# Patient Record
Sex: Male | Born: 1950
Health system: Southern US, Community
[De-identification: ages and names within clinical notes are randomized; demographics above are authoritative.]

## PROBLEM LIST (undated history)

## (undated) DIAGNOSIS — M109 Gout, unspecified: Secondary | ICD-10-CM

## (undated) DIAGNOSIS — G20A1 Parkinson's disease without dyskinesia, without mention of fluctuations: Secondary | ICD-10-CM

## (undated) DIAGNOSIS — K219 Gastro-esophageal reflux disease without esophagitis: Secondary | ICD-10-CM

## (undated) DIAGNOSIS — IMO0002 Reserved for concepts with insufficient information to code with codable children: Secondary | ICD-10-CM

## (undated) DIAGNOSIS — M1990 Unspecified osteoarthritis, unspecified site: Secondary | ICD-10-CM

## (undated) DIAGNOSIS — G709 Myoneural disorder, unspecified: Secondary | ICD-10-CM

## (undated) DIAGNOSIS — T8859XA Other complications of anesthesia, initial encounter: Secondary | ICD-10-CM

## (undated) DIAGNOSIS — M722 Plantar fascial fibromatosis: Secondary | ICD-10-CM

## (undated) DIAGNOSIS — G4730 Sleep apnea, unspecified: Secondary | ICD-10-CM

## (undated) DIAGNOSIS — I7121 Aneurysm of the ascending aorta, without rupture: Secondary | ICD-10-CM

## (undated) DIAGNOSIS — S060X9A Concussion with loss of consciousness of unspecified duration, initial encounter: Secondary | ICD-10-CM

## (undated) DIAGNOSIS — N400 Enlarged prostate without lower urinary tract symptoms: Secondary | ICD-10-CM

## (undated) DIAGNOSIS — M48061 Spinal stenosis, lumbar region without neurogenic claudication: Secondary | ICD-10-CM

## (undated) DIAGNOSIS — S060XAA Concussion with loss of consciousness status unknown, initial encounter: Secondary | ICD-10-CM

## (undated) DIAGNOSIS — H409 Unspecified glaucoma: Secondary | ICD-10-CM

## (undated) DIAGNOSIS — K5790 Diverticulosis of intestine, part unspecified, without perforation or abscess without bleeding: Secondary | ICD-10-CM

## (undated) DIAGNOSIS — T4145XA Adverse effect of unspecified anesthetic, initial encounter: Secondary | ICD-10-CM

## (undated) DIAGNOSIS — I739 Peripheral vascular disease, unspecified: Secondary | ICD-10-CM

## (undated) DIAGNOSIS — E785 Hyperlipidemia, unspecified: Secondary | ICD-10-CM

## (undated) DIAGNOSIS — F32A Depression, unspecified: Secondary | ICD-10-CM

## (undated) DIAGNOSIS — G20 Parkinson's disease: Secondary | ICD-10-CM

## (undated) DIAGNOSIS — T7840XA Allergy, unspecified, initial encounter: Secondary | ICD-10-CM

## (undated) DIAGNOSIS — F419 Anxiety disorder, unspecified: Secondary | ICD-10-CM

## (undated) DIAGNOSIS — B1920 Unspecified viral hepatitis C without hepatic coma: Secondary | ICD-10-CM

## (undated) DIAGNOSIS — I712 Thoracic aortic aneurysm, without rupture: Secondary | ICD-10-CM

## (undated) DIAGNOSIS — I10 Essential (primary) hypertension: Secondary | ICD-10-CM

## (undated) HISTORY — PX: UPPER GASTROINTESTINAL ENDOSCOPY: SHX188

## (undated) HISTORY — DX: Spinal stenosis, lumbar region without neurogenic claudication: M48.061

## (undated) HISTORY — PX: TONSILLECTOMY: SUR1361

## (undated) HISTORY — DX: Essential (primary) hypertension: I10

## (undated) HISTORY — DX: Hyperlipidemia, unspecified: E78.5

## (undated) HISTORY — PX: COLONOSCOPY: SHX174

## (undated) HISTORY — DX: Diverticulosis of intestine, part unspecified, without perforation or abscess without bleeding: K57.90

## (undated) HISTORY — PX: EYE SURGERY: SHX253

## (undated) HISTORY — DX: Unspecified viral hepatitis C without hepatic coma: B19.20

## (undated) HISTORY — DX: Unspecified glaucoma: H40.9

## (undated) HISTORY — DX: Peripheral vascular disease, unspecified: I73.9

## (undated) HISTORY — DX: Gout, unspecified: M10.9

## (undated) HISTORY — DX: Allergy, unspecified, initial encounter: T78.40XA

---

## 1999-09-03 ENCOUNTER — Inpatient Hospital Stay (HOSPITAL_COMMUNITY): Admission: EM | Admit: 1999-09-03 | Discharge: 1999-09-05 | Payer: Self-pay | Admitting: Emergency Medicine

## 2003-07-11 ENCOUNTER — Ambulatory Visit (HOSPITAL_COMMUNITY): Admission: RE | Admit: 2003-07-11 | Discharge: 2003-07-11 | Payer: Self-pay | Admitting: Gastroenterology

## 2003-07-11 ENCOUNTER — Encounter (INDEPENDENT_AMBULATORY_CARE_PROVIDER_SITE_OTHER): Payer: Self-pay | Admitting: Specialist

## 2003-07-28 HISTORY — PX: CARDIAC CATHETERIZATION: SHX172

## 2003-12-24 ENCOUNTER — Inpatient Hospital Stay (HOSPITAL_COMMUNITY): Admission: EM | Admit: 2003-12-24 | Discharge: 2003-12-27 | Payer: Self-pay | Admitting: Gastroenterology

## 2003-12-25 ENCOUNTER — Encounter (INDEPENDENT_AMBULATORY_CARE_PROVIDER_SITE_OTHER): Payer: Self-pay | Admitting: *Deleted

## 2004-03-26 ENCOUNTER — Ambulatory Visit (HOSPITAL_COMMUNITY): Admission: RE | Admit: 2004-03-26 | Discharge: 2004-03-26 | Payer: Self-pay | Admitting: Gastroenterology

## 2004-03-26 ENCOUNTER — Encounter (INDEPENDENT_AMBULATORY_CARE_PROVIDER_SITE_OTHER): Payer: Self-pay | Admitting: Specialist

## 2005-10-08 ENCOUNTER — Encounter: Admission: RE | Admit: 2005-10-08 | Discharge: 2005-10-08 | Payer: Self-pay | Admitting: Gastroenterology

## 2006-05-10 ENCOUNTER — Ambulatory Visit: Payer: Self-pay | Admitting: Family Medicine

## 2006-05-25 ENCOUNTER — Ambulatory Visit: Payer: Self-pay | Admitting: Family Medicine

## 2006-09-15 ENCOUNTER — Ambulatory Visit: Payer: Self-pay | Admitting: Family Medicine

## 2007-08-25 ENCOUNTER — Ambulatory Visit: Payer: Self-pay | Admitting: Family Medicine

## 2007-10-27 ENCOUNTER — Ambulatory Visit: Payer: Self-pay | Admitting: Family Medicine

## 2008-05-21 ENCOUNTER — Ambulatory Visit (HOSPITAL_COMMUNITY): Admission: RE | Admit: 2008-05-21 | Discharge: 2008-05-21 | Payer: Self-pay | Admitting: Ophthalmology

## 2008-09-17 ENCOUNTER — Encounter (INDEPENDENT_AMBULATORY_CARE_PROVIDER_SITE_OTHER): Payer: Self-pay | Admitting: Ophthalmology

## 2008-09-17 ENCOUNTER — Ambulatory Visit (HOSPITAL_COMMUNITY): Admission: RE | Admit: 2008-09-17 | Discharge: 2008-09-17 | Payer: Self-pay | Admitting: Ophthalmology

## 2008-10-29 ENCOUNTER — Ambulatory Visit: Payer: Self-pay | Admitting: Family Medicine

## 2008-12-13 ENCOUNTER — Ambulatory Visit: Payer: Self-pay | Admitting: Family Medicine

## 2009-02-08 ENCOUNTER — Ambulatory Visit: Payer: Self-pay | Admitting: Family Medicine

## 2009-03-19 ENCOUNTER — Ambulatory Visit: Payer: Self-pay | Admitting: Family Medicine

## 2009-05-08 ENCOUNTER — Ambulatory Visit: Payer: Self-pay | Admitting: Family Medicine

## 2009-05-16 ENCOUNTER — Ambulatory Visit: Payer: Self-pay | Admitting: Family Medicine

## 2009-07-10 ENCOUNTER — Ambulatory Visit: Payer: Self-pay | Admitting: Family Medicine

## 2009-07-27 HISTORY — PX: NM MYOVIEW LTD: HXRAD82

## 2009-08-09 ENCOUNTER — Ambulatory Visit: Payer: Self-pay | Admitting: Family Medicine

## 2009-09-20 ENCOUNTER — Ambulatory Visit: Payer: Self-pay | Admitting: Family Medicine

## 2010-08-17 ENCOUNTER — Encounter: Payer: Self-pay | Admitting: Gastroenterology

## 2010-09-17 ENCOUNTER — Encounter (INDEPENDENT_AMBULATORY_CARE_PROVIDER_SITE_OTHER): Payer: Commercial Managed Care - PPO | Admitting: Family Medicine

## 2010-09-17 DIAGNOSIS — I10 Essential (primary) hypertension: Secondary | ICD-10-CM

## 2010-09-17 DIAGNOSIS — E785 Hyperlipidemia, unspecified: Secondary | ICD-10-CM

## 2010-09-17 DIAGNOSIS — Z0000 Encounter for general adult medical examination without abnormal findings: Secondary | ICD-10-CM

## 2010-09-17 DIAGNOSIS — M461 Sacroiliitis, not elsewhere classified: Secondary | ICD-10-CM

## 2010-11-11 LAB — CBC
HCT: 45.2 % (ref 39.0–52.0)
Platelets: 316 10*3/uL (ref 150–400)
RDW: 12.7 % (ref 11.5–15.5)

## 2010-11-11 LAB — BASIC METABOLIC PANEL
BUN: 16 mg/dL (ref 6–23)
Calcium: 9.5 mg/dL (ref 8.4–10.5)
Creatinine, Ser: 1.15 mg/dL (ref 0.4–1.5)
GFR calc non Af Amer: >60 mL/min (ref >60)
Glucose, Bld: 89 mg/dL (ref 70–99)
Sodium: 140 mEq/L (ref 135–145)

## 2010-12-09 NOTE — Op Note (Signed)
[name], [name]               ACCOUNT NO.:  [id number]   MEDICAL RECORD NO.:  [id number]          PATIENT TYPE:  AMB   LOCATION:  SDS                          FACILITY:  [facility]   PHYSICIAN:  Alford Highland. Rankin, M.D.   DATE OF BIRTH:  [birth year]   DATE OF PROCEDURE:  05/21/2008  DATE OF DISCHARGE:                               OPERATIVE REPORT   PREOPERATIVE DIAGNOSES:  1. Epiretinal membrane with internal limiting membrane topographic      distortion.  2. Proliferative vitreal retinopathy, stage B.  3. History of retinal tear, retinal detachment, giant retinal tear,      right eye.   POSTOPERATIVE DIAGNOSES:  1. Epiretinal membrane with internal limiting membrane topographic      distortion.  2. Proliferative vitreal retinopathy, stage B.  3. History of retinal tear, retinal detachment, giant retinal tear,      right eye.  4. Vitreous herniation into the anterior chamber.   PROCEDURE:  1. Posterior vitrectomy with internal limiting membrane peel - 25      gauge, right eye with additional laser retinal pack __________      temporally  to maintain retinal reattachment for giant retinal      tear.  2. Anterior vitrectomy performed with posterior segment instruments      via small paracentesis incisions anteriorly.  3. Posterior capsulotomy, surgical, to clear the visual access.   SURGEON:  Alford Highland. Rankin, MD.   ANESTHESIA:  Local as per my anesthesia control.   INDICATIONS FOR PROCEDURE:  The patient is a 60 year old man who has a  history of giant retinal tear detachment, right eye with significant  vitreous dispersion in the right eye leading to cloudy vision with  additional epiretinal membrane disclosed posteriorly.  The patient  __________ to remove the epiretinal membrane, to remove the vitreous  opacification in order to maximize visual performance.  The patient  understands the risk of anesthesia, including occurrence of death, loss  of the eye including but not  limited to hemorrhage, infection, scarring,  need for another surgery, no change in vision, loss of vision,  progressive disease despite intervention.   PROCEDURE IN DETAIL:  Appropriate signed consent was obtained.  The  patient was taken to the operating room.  In the operating room,  appropriate monitors followed by mild sedation.  Xylocaine 2% and 5 mL  injected retrobulbar with additional 5 mL laterally in fashion of  modified Darel Hong.  The left periocular region was then sterilely  prepped and draped in the usual sterile fashion.  Lid speculum was  applied.  A 25-gauge trocar was placed in the inferotemporal quadrant.  Superior trocar was applied.  Core vitrectomy was then begun.  The  notable findings were significant, large clumps of pigment debris in the  vitreous, mid vitreous, as well as around the vitreous base.  Sclerae  pressure was then used to clear vitreous base, vitreous circumcised 360  degrees.  Visual access was clear.  It is notable at this time that  there is mild posterior capsule opacification which was going to limit  macular  surgery and for this reason surgical posterior capsulotomy was  fashioned.  At this time, a 25-gauge forceps was then used to find an  edge temporally over the epiretinal membrane.  The epiretinal membrane  was removed followed thereafter internal limiting membrane peel with the  typical change of the retina found after removal of the internal  limiting membrane.  No complications occurred.  At this time, the  instruments were removed from the eye, and laser photocoagulation was  placed on the posterior edge of the giant retinal tear previously noted  in temporally in this right eye.  No complication occurred.  The retina  remained nicely attached.  At this time, the instruments were removed  from the eye, and the __________  trocar was removed.  The infusion was  removed.  Subconjunctival Decadron applied.  Sterile patch and Fox  shield  were applied.  The patient tolerated the procedure well without  complications.      Alford Highland Rankin, M.D.  Electronically Signed     GAR/MEDQ  D:  05/21/2008  T:  05/22/2008  Job:  [id number]

## 2010-12-09 NOTE — Op Note (Signed)
NAME:  [name], [name]               ACCOUNT NO.:  [id number]   MEDICAL RECORD NO.:  [id number]          PATIENT TYPE:  AMB   LOCATION:  SDS                          FACILITY:  [facility]   PHYSICIAN:  Alford Highland. Rankin, M.D.   DATE OF BIRTH:  08-Oct-1950   DATE OF PROCEDURE:  09/17/2008  DATE OF DISCHARGE:                               OPERATIVE REPORT   PREOPERATIVE DIAGNOSES:  1. Dislocated posterior chamber intraocular lens, right eye.  2. Aphakia, right eye.  3. Secondary open-angle glaucoma - pigmentary glaucoma, out of control      despite maximally tolerated medical therapy, right eye.   PROCEDURES:  1. Posterior vitrectomy, right eye - 25 gauge.  2. Removal of dislocated intraocular lens - foreign body in the      vitreous cavity of the right eye.  3. Insertion secondarily of anterior chamber intraocular lens of the      right eye.  4. Insertion of glaucoma Seton aqueous shunt, Baerveldt via the      anterior chamber, model BG101-350.  The lens is an anterior chamber      lens which is an MTA4UO, power of +11.5.  5. Surgical capsulotomy.   SURGEON:  Alford Highland. Rankin, MD   ANESTHESIA:  General endotracheal anesthesia.   INDICATIONS FOR PROCEDURE:  The patient is a 60 year old man who has a  complicated history of ocular problems which began with retinal tear  detachments which after treatment extended to a giant retinal tear  detachment superotemporally in the right eye.  This has been repaired in  the past with vitrectomy, laser photocoagulation, and injection of  intravitreal gas.  Such mechanical cataract development resulted in  cataract extraction with intraocular lens placement in [facility], which developed intraocular lens malposition resulting in  malposition of the lens and subsequent dislocation of lens in the right  eye.  The patient was said this was an attempt to have lens following  the visual axis and he has poor vision.  The patient also has  glaucoma  of intraocular pressures of 40 mm out of control, despite maximally  tolerated medical therapy.  The patient was said this is an attempt to  remove the dislocated intraocular lens, correct the underlying aphakia  problem, and also control the intraocular pressure.  He understands the  risk of anesthesia including rare occurrence of death, loss of the eye  including but not limited to hemorrhage, infection, scarring, need for  another surgery, no change in vision, loss of vision, and progressive  disease despite intervention.   PROCEDURE IN DETAIL:  Appropriate signed consent was obtained.  The  patient was taken to the operating room.  In the operating room  appropriate monitors followed by mild sedation.  General endotracheal  anesthesia was instituted.  This had been this patient's request.  The  right periocular region was sterilely prepped and draped in the usual  ophthalmic fashion.  The lid speculum was applied.  The conjunctival  peritomy was then fashioned superiorly around the 8:30 position in this  right eye and to the 1:30 position.  The superior rectus and the lateral  rectus muscles were isolated on 2-0 silk ties.  Quadrants were entered  superonasally.  This allowed for 2-0 silk ties to be placed on the  rectus muscles, two rectus muscles as mentioned before.  At this time, a  25-gauge infusion cannula was placed in the inferotemporal quadrant  transconjunctivally.  Infusion was then turned on.  A superior trocar  was then applied.  Vitrectomy had been previously done.  Vitrectomy  instrumentations were placed in the eye and the bilateral attachment  placed on the [employer], the new [employer].   It was clear that there was no residual vitreous peripherally,  centrally, or elsewhere.  At this time, a groove limbal incision was  then fashioned superiorly in anticipation of removal of the lens as well  as placement of secondary intraocular lens.  At  this time, the  intraocular lens was grasped posteriorly with the 25-gauge forceps under  direct microscopic visualization.  This was brought forward with the  left hand to the iris plane.  The limbal wound superiorly was opened  with an MVR blade.  The Viscoat had been used previously to keep the  cornea coated and the anterior chamber formed.  This smaller wound was  then enlarged with a 20-gauge MVR blade and this allowed for passage of  a 20-gauge DORC forceps to extract the intraocular lens and externalize  this.  This was sent for gross only evaluation.   At this time, the intraocular lens for the anterior chamber was  selected.  The MTA4UO was selected.  The white to white corneal  measurements was 12.75.  The intraocular lens was then placed into the  anterior chamber and secured in a horizontal fashion what appeared to be  a scleral spur.  No iris indentation was noted.   At this time, the limbal wound superiorly was closed with interrupted 10-  0 nylon sutures.  These sutures were then cut in interrupted fashion and  the knots were rotated away from limbus.  At this time, a surgical  peripheral iridectomy was then performed superiorly at the 12 o'clock  position with the vitrectomy instrumentation.   The remnants of the posterior capsule were removed as a capsulectomy to  prevent a nidus of proliferative tissue in the vitreous cavity.  Remnants of this capsule were then excised with vitrectomy  instrumentation.   At this time, attention was drawn to placement of the glaucoma shunt.  The Baerveldt valve was irrigated out through the tube to confirm its  patency.  The Baerveldt valve had been previously dipped in sponges  findings.  Thereafter, the Baerveldt valve wings were placed in the  lateral and superior rectus muscles.  The baseplate was then secured  with 8-0 nylon sutures to the sclera.  Approximately 15-16 mm posterior  to the limbus in a post-equatorial  fashion.   The tube was brought forward and this was gently laid flat over the  rounded contour of the sclera with 7-0 Vicryl horizontal mattress suture  lightly.  The tube was then cut in a beveled fashion and a scleral  tunnel through the limbus was fashioned initially with the beveled blade  and then subsequently the anterior chamber was entered in this area with  a 20-gauge MVR.  The beveled tube was then placed in the anterior  chamber.  The intraocular pressure of the eye was still well controlled  and this was done in low pressures.  A  ligature of 7-0 Vicryl was then  applied with the intraocular pressure at 20 mm with a very low flow  confirmed and then subsequently was able to check this level off at 20  mm and flow was resumed at pressures above 25-30 mm.  This confirmed  that the ligature had closed the lumen of the tube appropriately to help  control the early postoperative pressures.   At this time, Tutoplast was secured in a vertical mattress fashion with  7-0 Vicryl.  The conjunctiva and Tenons were irrigated with bug juice  and these were brought forward and closed at the limbus with 7-0 Vicryl  sutures.  Previously, the 2-0 silk had been removed and now at this time  the infusion cannula was removed.  Intraocular pressure before removal  had been around up to 40 and I was confirmed that the conjunctiva  superiorly did elevate confirming that there was flow at these higher  pressures through the tube.  At this time, the infusion cannula was  removed and subconjunctival Decadron applied.  Sterile patch and Fox  shield applied.  The patient tolerated the procedure well without  complication.  The patient was awakened from anesthesia and taken to  PACU in good and stable addition.      Alford Highland Rankin, M.D.  Electronically Signed     Alford Highland. Rankin, M.D.  Electronically Signed    GAR/MEDQ  D:  09/17/2008  T:  09/18/2008  Job:  [id number]   cc:   Bettye Boeck,  M.D.

## 2010-12-12 NOTE — Op Note (Signed)
NAME:  [name], [name]                         ACCOUNT NO.:  [id number]   MEDICAL RECORD NO.:  [id number]                   PATIENT TYPE:  AMB   LOCATION:  ENDO                                 FACILITY:  [facility]   PHYSICIAN:  Bernette Redbird, M.D.                DATE OF BIRTH:  1951-03-25   DATE OF PROCEDURE:  03/26/2004  DATE OF DISCHARGE:                                 OPERATIVE REPORT   PROCEDURE:  Upper endoscopy with biopsies.   INDICATION:  [name], who, 3 months ago, had an upper GI  bleed related to an antral ulcer, at which time the patient was on a daily  baby aspirin.  Since then, he has been off the aspirin and treated with  Protonix.  H. pylori was absent on biopsies at the time of his endoscopy.   FINDINGS:  Diffuse erythematous gastritis, with healing of previous gastric  ulcer.   DESCRIPTION OF PROCEDURE:  The nature, purpose, and risks of the procedure  were familiar to the patient from prior examination, and he provided written  consent.  Sedation was fentanyl 50 mcg and Versed 7 mg IV without  arrhythmias or desaturation.  The Olympus video endoscope was passed under  direct vision.  The vocal cords were not well seen but looked grossly  normal.  The esophagus was entered without difficulty and had normal mucosa  without evidence of reflux esophagitis, Barrett's esophagus, varices,  infection, and neoplasia or any ring, stricture, or hiatal hernia.  The  stomach contained a small, clear residual.   The gastric mucosa was diffusely erythematous but without any focal  erosions, ulcers, polyps, masses, or mucosal hemorrhages.  The pylorus,  duodenal bulb, and second duodenum looked normal.   In the prepyloric antral region on the anterior wall of the stomach, there  was a dimple consistent with scarring from previous ulceration but no  evidence of residual ulceration at this time.   Antral biopsies were obtained to look for evidence of H. pylori  infection as  confirmation of the previous negative biopsies.  The scope was then removed  from the patient who tolerated the procedure well and without apparent  complication.   IMPRESSION:  1.  Diffuse, erythematous gastritis (535.40).  2.  Resolution of previous antral ulceration.   PLAN:  1.  Await pathology results and treat H. pylori infection if detected.  2.  It is a judgment call, at the discretion of the patient's cardiologist      and primary physician, whether or not he has      sufficient risk factors to warrant ongoing prophylactic aspirin therapy.      If so, I would have him remain on a PPI forever for the purpose of ulcer      prophylaxis.  If he does not resume aspirin, it would be okay to go off      his PPI therapy.  Bernette Redbird, M.D.    RB/MEDQ  D:  03/26/2004  T:  03/26/2004  Job:  [id number]   cc:   Madaline Savage, M.D.  [address]  [place]  [place] [address]  Fax: [phone]   Sharlot Gowda, M.D.  [address]  [place], [place] [address]  Fax: [phone]

## 2010-12-12 NOTE — H&P (Signed)
[facility]  Patient:    [name], [name]                      MRN: [id number] Adm. Date:  [id number] Attending:  Sandi Raveling CC:         Genene Churn. Sherin Quarry, M.D.             Ronnald Nian, M.D.             Roosvelt Harps, M.D.                         History and Physical  CHIEF COMPLAINT:  Weakness.  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  He has had some job stress and has had increased alcohol consumption,  drinking four to five beers or liquor on the weekends, the last 48 hours prior o admission.  Smokes a four cigarettes a week.  On the day prior to admission he ad a dark stool.  On the day of admission, early in the morning, he had another dark stool.  He got up, went to work, had a lunch of vegetables at noon on the day of admission.  Two to three hours later he began to feel dizzy with standing and diaphoretic.  He was referred by his physician to [facility], who evaluated him and noted him to be presyncopal with a hemoglobin of 11.5 and referred him o the emergency room.  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  He has grown children.  Two to three cigarettes a week.  Four to five alcohol or beer every week.  CURRENT MEDICATIONS:  HCTZ and Cardura, unknown dosage.  PAST MEDICAL HISTORY:  In the 30s ESG by either Dr. Sherin Quarry or Thurston Hole for hematemesis.  He was treated again in 1990 with question antibiotics for PUD by  Dr. Susann Givens.  In 1999 he saw Dr. Luther Parody for PUD with again antibiotic treatment. No ESG.  Tonsillectomy as a child.  FAMILY HISTORY:  Mother deceased in her 34s of heart disease.  Father living, has hypertension.  Sister, status  post nephrectomy for renal cancer.  Denies family  history of pulmonary, GI, diabetes, or emotional problems.  REVIEW OF SYSTEMS:  HEENT:  Wears glasses.  CARDIAC:  Denies chest pain in the ast or currently.  PULMONARY:  No wheezing or shortness of breath. GASTROINTESTINAL: See above.  PHYSICAL EXAMINATION:  GENERAL:  Physical examination reveals a pleasant black male who is slightly dizzy with sitting and standing.  VITAL SIGNS:  Blood pressure 110/60, pulse 88, respiratory rate 12, temperature  97.6.  HEENT:  Fundi normal.  Pharynx found injected.  NECK:  Carotid 2+.  No bruits.  No thyromegaly.  CARDIAC:  Normal S1, S2.  LUNGS:  Clear to A&P.  ABDOMEN:  Soft.  Minimal epigastric tenderness.  RECTAL:  Examination done an hour ago by Dr. Theresia Lo was grossly positive for blood.  Hemoglobin 11.5.  IMPRESSION:  Gastrointestinal bleed, probably peptic ulcer, possibly related to  recent ethanol.  PLAN:  Admission, observation, ESG. DD:  09/03/99 TD:  09/04/99 Job: [id number]

## 2010-12-12 NOTE — Op Note (Signed)
NAME:  [name], [name]                         ACCOUNT NO.:  [id number]   MEDICAL RECORD NO.:  [id number]                   PATIENT TYPE:  AMB   LOCATION:  ENDO                                 FACILITY:  [facility]   PHYSICIAN:  Bernette Redbird, M.D.                DATE OF BIRTH:  09-15-1950   DATE OF PROCEDURE:  07/11/2003  DATE OF DISCHARGE:                                 OPERATIVE REPORT   PROCEDURE:  Colonoscopy with polypectomy.   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  No risk factors, no symptoms.   FINDINGS:  Small sessile rectal cancer polyp removed.  Mild left-sided  diverticulosis.   DESCRIPTION OF PROCEDURE:  The nature, purpose, and risks of the procedure  had been discussed with the patient who provided written consent.  Sedation  was fentanyl 100 micrograms and Versed 8 mg IV without arrhythmias or  desaturation.  On digital exam, the prostate was normal.  The Olympus adult  videocolonoscope was advanced to the terminal ileum with slight looping  overcome by external abdominal compression, and pullback was the performed.  The terminal ileum had normal appearance, and the quality of the prep in the  colon was excellent, so it was felt that all areas were well seen.   There was a 4-mm sessile slightly verrucous polyp in the mid-rectum at about  10 cm removed by snare technique with complete hemostasis and no evidence of  excessive cautery.  No other polyps were seen, and there was no evidence of  cancer, colitis, or vascular malformations.  There was some mild left-sided  diverticulosis.  Retroflexion in the rectum and reinspection of the  rectosigmoid was otherwise unremarkable.   The patient tolerated the procedure well, and there were no apparent  complications.   IMPRESSION:  1. Rectal polyp (211.4).  2. Left-sided diverticulosis.   PLAN:  Await pathology results.         Bernette Redbird, M.D.    RB/MEDQ  D:  07/11/2003  T:  07/11/2003  Job:  [id number]   cc:   Rene Kocher, M.D.

## 2010-12-12 NOTE — Op Note (Signed)
NAME:  [name], [name]                         ACCOUNT NO.:  [id number]   MEDICAL RECORD NO.:  [id number]                   PATIENT TYPE:  INP   LOCATION:  0344                                 FACILITY:  [facility]   PHYSICIAN:  Bernette Redbird, M.D.                DATE OF BIRTH:  [birth year]   DATE OF PROCEDURE:  12/25/2003  DATE OF DISCHARGE:                                 OPERATIVE REPORT   PROCEDURE:  Upper endoscopy with biopsies.   INDICATIONS:  [name] who was at his birthday party, had had  a few drinks that day and preceding days, also takes a daily 81 mg Ecotrin,  and had taken some additional aspirin for a headache.  He developed  hematemesis, melena, and syncope and was admitted to [facility] and  from there transferred to [facility] to be closer to home.  He has remained  stable in the hospital over the past 24 hours.   FINDINGS:  Antral ulcer with dirty base consistent with recent hemorrhage.   PROCEDURE:  The risks of the procedure had been reviewed with the patient,  and he provided written consent.  Sedation was fentanyl 50 mcg and Versed 4  mg IV without arrhythmias or desaturation.  The Olympus video endoscope was  passed under direct vision.  The vocal cords were not well-seen, but the  esophagus was readily entered and was normal in its entirety, without  evidence of reflux esophagitis, Barrett's esophagus, varices, infection,  neoplasia, or any Mallory-Weiss tear, ring, stricture, or hiatal hernia.   The stomach was entered.  It contained a small amount of amber-colored bile.  I did not see any blood or coffee-grounds.   In the antral region, probably about 5 cm proximal to the pylorus, along the  lesser curve, was a fairly deep, roughly 1.5 x 1 cm ulcer with a reddish  base but no discrete visible vessel and no adherent clot.  The margins of  the ulcer were smooth and not heaped-up, and the ulcer was overall benign in  appearance.  Antral  biopsies were obtained prior to removal of the scope to  look for H. pylori infection.  The pylorus, duodenal bulb, and second  duodenum were unremarkable in appearance, was a retroflexed view of the more  proximal sections of the stomach.   I elected not to inject the ulcer since no discrete visible vessel was  observed.  The scope was removed from the patient, who tolerated the  procedure well and without apparent complication.   IMPRESSION:  1. Antral gastric ulcer with dirty base, accounting for patient's recent     gastrointestinal bleeding, 531.00.  2. No bleeding at the time of this exam.   PLAN:  1. Await pathology results.  2. Continue antipeptic therapy.  3. Further management to depend on the patient's clinical evolution.  Bernette Redbird, M.D.    RB/MEDQ  D:  12/25/2003  T:  12/25/2003  Job:  [id number]   cc:   Sharlot Gowda, M.D.  [address]  [place], [place] [address]  Fax: [phone]

## 2010-12-12 NOTE — Discharge Summary (Signed)
NAME:  [name], [name]                         ACCOUNT NO.:  [id number]   MEDICAL RECORD NO.:  [id number]                   PATIENT TYPE:  INP   LOCATION:  0344                                 FACILITY:  [facility]   PHYSICIAN:  Bernette Redbird, M.D.                DATE OF BIRTH:  January 07, 1951   DATE OF ADMISSION:  12/24/2003  DATE OF DISCHARGE:  12/27/2003                                 DISCHARGE SUMMARY   FINAL DIAGNOSES:  1. Gastrointestinal bleeding secondary to gastric ulcer, probably related to     aspirin exposure.  2. Posthemorrhagic anemia secondary to #1.  3. Hypertension.   CONSULTATIONS:  None.   PROCEDURE:  Upper endoscopy with biopsy.   COMPLICATIONS:  None.   LABORATORY DATA:  Admission hemoglobin was 9.6 and discharge hemoglobin,  without need for transfusion, was 9.4.  BUN on the day after admission was  8.  Liver chemistries normal, albumin 3.0, platelets 333,000.   HOSPITAL COURSE:  [name] was at his 53th birthday party in [place],  having had a few drinks both that day and the preceding day and also taking  a daily 81 mg Ecotrin for cardiac prophylaxis plus some additional aspirin  for a headache.  He developed hematemesis, melena a couple of times and  actually became syncopal and was admitted emergently to [facility] and  then transferred here to be closer to home. By the time he arrived here, his  vital signs were stable and he really showed no evidence of active GI  bleeding so he was observed overnight with IV fluids and IV Protonix and was  endoscoped by me the next morning which showed an antral ulcer with  __________ suggestive of recent hemorrhage, but with no blood in the stomach  and no visible vessel.  Biopsies of the antrum showed no H. pylori  infection.  He was treated with intensive antipeptic therapy, his diet and  activity were advanced, and he remained stable over the 48 hours he was in  the hospital so discharge home was felt to  be clinically appropriate.   DISPOSITION:  The patient is discharged home on a regular diet with light  activity, as tolerated.  He is to call us in the event of recurrent  bleeding.  Followup with me in the office will be in about one month.   DISCHARGE MEDICATIONS:  He is to stop his Ecotrin for the time being. He  will resume his other home medications. His new medication is Protonix 40 mg  b.i.d.   CONDITION ON DISCHARGE:  Improved.                                               Bernette Redbird, M.D.    RB/MEDQ  D:  12/27/2003  T:  12/28/2003  Job:  [id number]   cc:   Sharlot Gowda, M.D.  [address]  [place], [place] [address]  Fax: [phone]

## 2010-12-12 NOTE — H&P (Signed)
NAME:  [name], [name] NO.:  [id number]   MEDICAL RECORD NO.:  [id number]                   PATIENT TYPE:  INP   LOCATION:  0344                                 FACILITY:  [facility]   PHYSICIAN:  [name], M.D.                DATE OF BIRTH:  April 08, 1951   DATE OF ADMISSION:  12/24/2003  DATE OF DISCHARGE:                                HISTORY & PHYSICAL   REASON FOR ADMISSION:  This patient is a 60 year old black male who was  admitted to [facility] early this morning at about 3 or  4 a.m. after he had gone to the emergency room there with complaints of  hematemesis, melena, and passing out. He had been at a barbecue and noted  these symptoms there. In the emergency room an NG tube placed and his  stomach was lavaged. No active bleeding seen, but there were some coffee  grounds. He was admitted to the ICU at [facility] early this morning.  He had not had any further bleeding since this admission and clinically he  remained stable there. I received a call this morning from the patient's  wife and also from Dr. Sabino Maxwell at [facility] who is an  intensivist there requesting transfer of the patient to our facility. There  was no specific medical reason that the patient needed to be transferred  here, but according to Dr. Sabino Maxwell, the patient's wife insisted that he be  transferred to [facility] because they wanted him under the care of Dr.  Matthias Maxwell who has taken care of the patient in the past. Dr. Matthias Maxwell has  performed colonoscopy on this patient in December of last year with findings  of a hyperplastic polyp. I spoke to Dr. Sabino Maxwell and the patient was stable.  He had a stable hemoglobin and hematocrit at [facility], his vital  signs were stable, and there were no signs of active bleeding coming from  the NG tube and it was felt that it would be safe to transfer him.  I  therefore accepted the  transfer. The patient at the present time states he  feels fine. He would like to have the NG tube out. According to him and the  wife, nothing has been draining out of it.   PAST MEDICAL HISTORY:  Medical problems: History of a Mallory-Weiss tear a  couple of years ago. This apparently came on after the patient was drinking  quite a bit of alcohol. Hypertension. Surgery: Tonsillectomy.   ALLERGIES:  None known.   MEDICATIONS:  Altace, hydrochlorothiazide, and Norvasc.   SOCIAL HISTORY:  He drinks some alcohol and smokes cigarettes.   FAMILY HISTORY:  Noncontributory.   REVIEW OF SYSTEMS:  He does not complain of anginal chest pain, shortness of  breath, cough, or sputum production. He gives no history of cardiac or  pulmonary disease.   PHYSICAL  EXAMINATION:  GENERAL: He does not appear in any acute distress.  HEENT: He is nonicteric.  VITAL SIGNS: Pulse is 100, respirations 22, blood pressure 150/98.  NECK: Supple with no masses, adenopathy, or goiter.  HEART: Regular rhythm. No murmurs, rubs, or gallops.  LUNGS: Clear.  ABDOMEN: Bowel sounds are normal, but soft, nontender. There is no  hepatosplenomegaly.   LABORATORY DATA:  [facility] review show that his hemoglobin was 10.5  initially, then 11.5, then 10.7.   IMPRESSION:  Upper gastrointestinal bleeding.   PLAN:  The patient is admitted to [facility] as a transfer from  [facility] where he did remain stable. The purpose of  the transfer was the patient and his wife insisted on being under the care  of Dr. Matthias Maxwell. There is also a history of hypertension.   PLAN:  Admit, observe, obtain H&H's.  The NG tube will be discontinued. He  will be typed and screened for blood. He will be transfused as necessary. We  will proceed with esophagogastroduodenoscopy in the morning to evaluate the  upper GI tract.                                               [name], M.D.    [name]   D:  12/24/2003  T:  12/24/2003  Job:  [id number]   cc:   [name], M.D.  [address]  [place], [place] [address]  Fax: [phone]

## 2011-01-16 ENCOUNTER — Encounter: Payer: Self-pay | Admitting: Family Medicine

## 2011-01-29 ENCOUNTER — Ambulatory Visit: Payer: Commercial Managed Care - PPO | Admitting: Family Medicine

## 2011-02-03 ENCOUNTER — Encounter: Payer: Self-pay | Admitting: Family Medicine

## 2011-02-03 ENCOUNTER — Ambulatory Visit (INDEPENDENT_AMBULATORY_CARE_PROVIDER_SITE_OTHER): Payer: 59 | Admitting: Family Medicine

## 2011-02-03 VITALS — BP 130/92 | HR 78 | Wt 188.0 lb

## 2011-02-03 DIAGNOSIS — R259 Unspecified abnormal involuntary movements: Secondary | ICD-10-CM

## 2011-02-03 DIAGNOSIS — R251 Tremor, unspecified: Secondary | ICD-10-CM

## 2011-02-03 NOTE — Progress Notes (Signed)
  Subjective:    Patient ID: [name], male    DOB: 25-Jul-1951, 60 y.o.   MRN: [id number]  HPI Over the last 1-1/2 months he's noted slight pulling sensation in his left hand and also in his left leg . No weakness, numbness or tingling. Visual changes, headaches. Cannot necessarily relate this to time of day or stress .   Review of Systems     Objective:   Physical Exam Alert and in no distress. Normal motor, sensory, DTRs of upper and lower extremities. No tremor is noted.       Assessment & Plan:  Tremor, etiology unclear Recommend he keep track of the symptoms of worsening come back. I explained this in detail to him. He is comfortable with this

## 2011-02-03 NOTE — Patient Instructions (Signed)
No therapy at this time. Keep track of the tremor and if it worsens or you get other symptoms, return here for further evaluation to

## 2011-02-25 DIAGNOSIS — I10 Essential (primary) hypertension: Secondary | ICD-10-CM | POA: Insufficient documentation

## 2011-02-25 DIAGNOSIS — E7800 Pure hypercholesterolemia, unspecified: Secondary | ICD-10-CM

## 2011-02-25 DIAGNOSIS — M2010 Hallux valgus (acquired), unspecified foot: Secondary | ICD-10-CM | POA: Insufficient documentation

## 2011-02-25 DIAGNOSIS — M129 Arthropathy, unspecified: Secondary | ICD-10-CM | POA: Insufficient documentation

## 2011-02-25 DIAGNOSIS — M7989 Other specified soft tissue disorders: Secondary | ICD-10-CM | POA: Insufficient documentation

## 2011-04-28 LAB — BASIC METABOLIC PANEL
CO2: 26
Chloride: 103
GFR calc Af Amer: >60
Potassium: 3.7

## 2011-04-28 LAB — CBC
HCT: 46.5
Hemoglobin: 16.1
MCV: 91.6
RDW: 13.2

## 2011-05-27 ENCOUNTER — Other Ambulatory Visit: Payer: Self-pay | Admitting: Family Medicine

## 2011-05-27 NOTE — Telephone Encounter (Signed)
Is this okay to refill? 

## 2011-07-03 ENCOUNTER — Encounter: Payer: Self-pay | Admitting: Family Medicine

## 2011-07-03 ENCOUNTER — Ambulatory Visit (INDEPENDENT_AMBULATORY_CARE_PROVIDER_SITE_OTHER): Payer: 59 | Admitting: Family Medicine

## 2011-07-03 VITALS — BP 124/80 | Wt 186.0 lb

## 2011-07-03 DIAGNOSIS — R259 Unspecified abnormal involuntary movements: Secondary | ICD-10-CM

## 2011-07-03 DIAGNOSIS — R251 Tremor, unspecified: Secondary | ICD-10-CM

## 2011-07-03 LAB — COMPREHENSIVE METABOLIC PANEL
Alkaline Phosphatase: 51 U/L (ref 39–117)
BUN: 19 mg/dL (ref 6–23)
Glucose, Bld: 99 mg/dL (ref 70–99)
Sodium: 139 mEq/L (ref 135–145)
Total Bilirubin: 0.4 mg/dL (ref 0.3–1.2)

## 2011-07-03 LAB — CBC WITH DIFFERENTIAL/PLATELET
Basophils Absolute: 0 10*3/uL (ref 0.0–0.1)
Basophils Relative: 1 % (ref 0–1)
Eosinophils Absolute: 0.1 10*3/uL (ref 0.0–0.7)
Hemoglobin: 15.5 g/dL (ref 13.0–17.0)
MCH: 31.7 pg (ref 26.0–34.0)
MCHC: 35.1 g/dL (ref 30.0–36.0)
Monocytes Absolute: 0.5 10*3/uL (ref 0.1–1.0)
Monocytes Relative: 9 % (ref 3–12)
Neutrophils Relative %: 46 % (ref 43–77)
RDW: 12.9 % (ref 11.5–15.5)

## 2011-07-03 NOTE — Progress Notes (Signed)
  Subjective:    Patient ID: [name], male    DOB: 05-24-1951, 60 y.o.   MRN: [id number]  HPI He is here for evaluation of a six-month history of difficulty with a trembling sensation in his left side of his face, hand and foot. He also has noted decrease in his fine motor abilities of his left hand. He has not dropped anything or tripped and fell. He is on no new medications. He does not use Street drugs.   Review of Systems     Objective:   Physical Exam alert and in no distress. EOMI. Other cranial nerves grossly intact. DTRs normal. Tympanic membranes and canals are normal. Throat is clear. Tonsils are normal. Neck is supple without adenopathy or thyromegaly. Cardiac exam shows a regular sinus rhythm without murmurs or gallops. Lungs are clear to auscultation. No clonus noted. Negative Babinski.     Assessment & Plan:  Tremor. Explained that this could possibly be Parkinson's. I will do routine blood screening and refer to neurology.

## 2011-07-04 LAB — RPR

## 2011-07-28 HISTORY — PX: TRANSTHORACIC ECHOCARDIOGRAM: SHX275

## 2011-08-20 ENCOUNTER — Encounter: Payer: Self-pay | Admitting: Medical

## 2011-08-20 ENCOUNTER — Ambulatory Visit (INDEPENDENT_AMBULATORY_CARE_PROVIDER_SITE_OTHER): Payer: 59 | Admitting: Medical

## 2011-08-20 VITALS — BP 130/90 | HR 80 | Temp 97.7°F | Resp 14 | Wt 185.0 lb

## 2011-08-20 DIAGNOSIS — R058 Other specified cough: Secondary | ICD-10-CM | POA: Insufficient documentation

## 2011-08-20 DIAGNOSIS — R059 Cough, unspecified: Secondary | ICD-10-CM

## 2011-08-20 DIAGNOSIS — R05 Cough: Secondary | ICD-10-CM

## 2011-08-20 MED ORDER — HYDROCODONE-HOMATROPINE 5-1.5 MG/5ML PO SYRP: 5.0000 mL | ORAL_SOLUTION | Freq: Four times a day (QID) | ORAL | Status: AC | PRN

## 2011-08-20 NOTE — Progress Notes (Signed)
  Subjective:   HPI [name] is a 61 y.o. male who presents for cough.  2 weeks ago he had cold symptoms, scratchy throat, cough, sneezing, runny nose, nasal congestion, and this drained into the chest, he got a little better but then ended up missing 3 days of work due to a lingering cough and head congestion.  He doesn't really feel sick now, has a little bit of sinus pressure, no shortness of breath or wheezing, but the cough seems to be lingering.  He is using over-the-counter cough medication which helps a little but when wears off the cough comes right back. He is a nonsmoker, no history of lung disease.  No other aggravating or relieving factors.  No other c/o.  The following portions of the patient's history were reviewed and updated as appropriate: allergies, current medications, past family history, past medical history, past social history, past surgical history and problem list.  Past Medical History  Diagnosis Date  . Hypertension   . Allergy   . PVD (peripheral vascular disease)   . Diverticulosis   . Hepatitis C     Review of Systems Gen.: No fever, chills, sweats, weight change Heart: No chest pain palpitations or edema Lungs: No shortness of breath or wheezing GI: Negative     Objective:   Physical Exam  General appearance: alert, no distress, WD/WN HEENT: TMs pearly, mild maxillary sinus tenderness, nares with turbinate swelling, erythema, pharynx normal appearing Neck: Supple, no lymphadenopathy, no mass, no thyromegaly Heart: RRR, normal S1, S2, no murmurs Lungs: CTA bilaterally, no wheezes, rhonchi, or rales Extremities: No edema, no cyanosis or clubbing Pulses: 2+ symmetric    Assessment and Plan:    Encounter Diagnosis  Name Primary?  . Post-viral cough syndrome Yes   Prescription today for Hycodan syrup, advised he begin over-the-counter antihistamine for a week or 2, get back on his reflux medication for a week or 2, and if cough not  improving, or worse symptoms suggestive of sinus infection to let me know.  Follow up otherwise when necessary.

## 2011-08-20 NOTE — Patient Instructions (Signed)
Your cough suggest lingering post viral syndrome cough.   There is also some post nasal drip today.    Begin Zyrtec or Benadryl at bedtime (OTC).  I prescribe a strong cough medication today.  If not improving in a week let us know.    However, if worse sinus pressure, fever, feeling sick again, call right away.

## 2011-09-05 ENCOUNTER — Emergency Department (HOSPITAL_COMMUNITY)
Admission: EM | Admit: 2011-09-05 | Discharge: 2011-09-06 | Disposition: A | Discharge status: Home / Self Care | Payer: No Typology Code available for payment source | Attending: Emergency Medicine | Admitting: Emergency Medicine

## 2011-09-05 ENCOUNTER — Encounter (HOSPITAL_COMMUNITY): Payer: Self-pay | Admitting: Emergency Medicine

## 2011-09-05 DIAGNOSIS — S61459A Open bite of unspecified hand, initial encounter: Secondary | ICD-10-CM

## 2011-09-05 DIAGNOSIS — Z79899 Other long term (current) drug therapy: Secondary | ICD-10-CM | POA: Insufficient documentation

## 2011-09-05 DIAGNOSIS — Z8619 Personal history of other infectious and parasitic diseases: Secondary | ICD-10-CM | POA: Insufficient documentation

## 2011-09-05 DIAGNOSIS — S61209A Unspecified open wound of unspecified finger without damage to nail, initial encounter: Secondary | ICD-10-CM | POA: Insufficient documentation

## 2011-09-05 DIAGNOSIS — I10 Essential (primary) hypertension: Secondary | ICD-10-CM | POA: Insufficient documentation

## 2011-09-05 DIAGNOSIS — I739 Peripheral vascular disease, unspecified: Secondary | ICD-10-CM | POA: Insufficient documentation

## 2011-09-05 DIAGNOSIS — M79609 Pain in unspecified limb: Secondary | ICD-10-CM | POA: Insufficient documentation

## 2011-09-05 DIAGNOSIS — W540XXA Bitten by dog, initial encounter: Secondary | ICD-10-CM | POA: Insufficient documentation

## 2011-09-05 MED ORDER — AMOXICILLIN-POT CLAVULANATE 875-125 MG PO TABS: 1.0000 | ORAL_TABLET | Freq: Two times a day (BID) | ORAL | Status: AC

## 2011-09-05 MED ORDER — TETANUS-DIPHTH-ACELL PERTUSSIS 5-2.5-18.5 LF-MCG/0.5 IM SUSP
0.5000 mL | Freq: Once | INTRAMUSCULAR | Status: AC
  Administered 2011-09-05: 0.5 mL via INTRAMUSCULAR
  Filled 2011-09-05: qty 0.5

## 2011-09-05 NOTE — ED Notes (Signed)
Pt presented to the ER with c/o dog bite to the right and left thumb and lower lip. Pt states while walking his dog, jorky, they got suddenly attacked by 2 pitt bulls, and pt was bitten. At this time he is unsure which dog bit him. incident is reported to the Police as well.

## 2011-09-05 NOTE — ED Provider Notes (Signed)
History     CSN: [id number]  Arrival date & time 09/05/11  2224   First MD Initiated Contact with Patient 09/05/11 2333     11:57 PM HPI Patient reports he is out walking his dog the neighbors dog ran pounds. Reports he was attacked by 2 Pitt bulls. Reports bite with bilateral thumbs, unknown last tetanus. Patient is a 61 y.o. male presenting with animal bite. The history is provided by the patient.  Animal Bite  The incident occurred just prior to arrival. There is an injury to the left thumb and right thumb. The pain is mild. It is unlikely that a foreign body is present. His tetanus status is unknown.    Past Medical History  Diagnosis Date  . Hypertension   . Allergy   . PVD (peripheral vascular disease)   . Diverticulosis   . Hepatitis C     Past Surgical History  Procedure Date  . Tonsillectomy     History reviewed. No pertinent family history.  History  Substance Use Topics  . Smoking status: Never Smoker   . Smokeless tobacco: Never Used  . Alcohol Use: Not on file      Review of Systems  Constitutional: Negative for fever.  Skin: Positive for wound (bite wound). Negative for color change.  All other systems reviewed and are negative.    Allergies  Review of patient's allergies indicates no known allergies.  Home Medications   Current Outpatient Rx  Name Route Sig Dispense Refill  . B COMPLEX VITAMINS PO CAPS Oral Take 1 capsule by mouth daily.      [name] LISINOPRIL-HYDROCHLOROTHIAZIDE 20-12.5 MG PO TABS Oral Take 1 tablet by mouth daily.      [name] PANTOPRAZOLE SODIUM 40 MG PO TBEC Oral Take 40 mg by mouth daily.    [name] PROTONIX PO Oral Take by mouth.    [name] SIMVASTATIN 20 MG PO TABS Oral Take 20 mg by mouth daily.     [name] VERAPAMIL HCL ER 240 MG PO TBCR  TAKE 1 TABLET BY MOUTH EVERY DAY 30 tablet 5    BP 139/89  Pulse 82  Temp(Src) 97.6 F (36.4 C) (Oral)  Resp 16  SpO2 99%  Physical Exam  Vitals reviewed. Constitutional: He is oriented to person,  place, and time. He appears well-developed and well-nourished.  HENT:  Head: Normocephalic and atraumatic.  Eyes: Pupils are equal, round, and reactive to light.  Musculoskeletal:       Small superficial laceration bilateral Thumbs. Hemostatic. Cap refill is normal. Normal Sensation, and range of motion  Neurological: He is alert and oriented to person, place, and time.  Skin: Skin is warm and dry. No rash noted. No erythema. No pallor.  Psychiatric: He has a normal mood and affect. His behavior is normal.    ED Course  Procedures   MDM     Provided tetanus prophylaxis. Wounds are superficial and appears to be a region. Do not require irrigation and closure. Do not appear to being 2 weeks.      Thomasene Lot, PA-C 09/06/11 0011

## 2011-09-06 NOTE — ED Provider Notes (Signed)
Medical screening examination/treatment/procedure(s) were performed by non-physician practitioner and as supervising physician I was immediately available for consultation/collaboration.  Jasmine Awe, MD 09/06/11 [phone]

## 2011-09-06 NOTE — ED Notes (Signed)
Pt is alert and oriented x 4 with respirations even and unlabored.  NAD at this time.  Discharge instructions reviewed with pt and pt verbalized understanding.  Pt ambulated with steady gait to lobby and wife to transport pt home.

## 2011-10-13 ENCOUNTER — Encounter: Payer: Self-pay | Admitting: Family Medicine

## 2011-10-13 ENCOUNTER — Ambulatory Visit (INDEPENDENT_AMBULATORY_CARE_PROVIDER_SITE_OTHER): Payer: 59 | Admitting: Family Medicine

## 2011-10-13 VITALS — BP 100/72 | HR 55 | Wt 188.0 lb

## 2011-10-13 DIAGNOSIS — R001 Bradycardia, unspecified: Secondary | ICD-10-CM

## 2011-10-13 DIAGNOSIS — I498 Other specified cardiac arrhythmias: Secondary | ICD-10-CM

## 2011-10-13 DIAGNOSIS — R55 Syncope and collapse: Secondary | ICD-10-CM

## 2011-10-13 NOTE — Patient Instructions (Signed)
Try Emetrol for the upset stomach. If your symptoms continue to let me know and we'll get you in with a cardiologist

## 2011-10-13 NOTE — Progress Notes (Signed)
  Subjective:    Patient ID: [name], male    DOB: 02/23/1951, 61 y.o.   MRN: [id number]  HPI Earlier today he noted a fluttering sensation as well as dizziness and heaviness with breathing but no true shortness of breath. The same time he also had some difficulty with nausea. Last night he did have difficulty with nausea and did have some diarrhea. Further discussion with him indicates he has also had difficulty with his stomach and was given Protonix by his cardiologist but has not been using this lately.   Review of Systems     Objective:   Physical Exam Cardiac exam shows a pulse of approximately 60 however her EKG showed a bradycardia. Lungs are clear to auscultation. Repeat blood pressure was 100/72       Assessment & Plan:   1. Bradycardia   2. Vagal reaction    I explained to him and his wife that I thought the blood pressure and pulse were vagal related since he also had nausea at that time. Recommend Emetrol. They're to call me in the morning and let me know what the pulse and blood pressure are.

## 2011-10-20 ENCOUNTER — Other Ambulatory Visit: Payer: Self-pay | Admitting: Family Medicine

## 2011-10-21 ENCOUNTER — Ambulatory Visit (INDEPENDENT_AMBULATORY_CARE_PROVIDER_SITE_OTHER): Payer: 59 | Admitting: Medical

## 2011-10-21 ENCOUNTER — Encounter: Payer: Self-pay | Admitting: Medical

## 2011-10-21 VITALS — BP 122/80 | HR 68 | Temp 97.5°F | Resp 16 | Wt 186.0 lb

## 2011-10-21 DIAGNOSIS — R05 Cough: Secondary | ICD-10-CM

## 2011-10-21 DIAGNOSIS — R059 Cough, unspecified: Secondary | ICD-10-CM

## 2011-10-21 DIAGNOSIS — R6882 Decreased libido: Secondary | ICD-10-CM

## 2011-10-21 MED ORDER — HYDROCODONE-HOMATROPINE 5-1.5 MG/5ML PO SYRP: 5.0000 mL | ORAL_SOLUTION | Freq: Three times a day (TID) | ORAL | Status: AC | PRN

## 2011-10-21 MED ORDER — AMOXICILLIN 875 MG PO TABS: 875.0000 mg | ORAL_TABLET | Freq: Two times a day (BID) | ORAL | Status: AC

## 2011-10-21 NOTE — Patient Instructions (Addendum)
Rest, drink plenty of fluids, begin Mucinex DM or Coricidin HBP for cough and congestion..  If worse or not improving, begin antibiotic Amoxicillin.  You can use the Hycodan syrup for bad cough such as at bedtime.

## 2011-10-21 NOTE — Progress Notes (Signed)
Subjective:  [name] is a 61 y.o. male who presents for few day hx/o cough, chest congestion, worse last night,  He does report some sinus pressure, head congestion, some sinus pressure and head congestion, and using some cough syrup from last visit.  He denies fever, NVD, ear pain or sore throat.  No chills, no sick contacts.     He is concerned about ED.  He notes no morning erections, decreased fullness with intercourse, can get full erections sometimes, and his desire is not there. Wants to check testosterone level, and wonders if his medication is causing this.  He has never tried Viagra or similar meds.  No other c/o.  The following portions of the patient's history were reviewed and updated as appropriate: allergies, current medications, past family history, past medical history, past social history, past surgical history and problem list.  Past Medical History  Diagnosis Date  . Hypertension   . Allergy   . PVD (peripheral vascular disease)   . Diverticulosis   . Hepatitis C    ROS  Heart: no CP, palpitations Lungs: no SOB, wheezing GI: negative GU: no dysuria, frequency, hematuria Neuro: negative  Objective:   Filed Vitals:   10/21/11 1158  BP: 122/80  Pulse: 68  Temp: 97.5 F (36.4 C)  Resp: 16    General appearance: Alert, WD/WN, no distress,mildly ill appearing                             Skin: warm, no rash, no diaphoresis                           Head: no sinus tenderness                            Eyes: conjunctiva normal, corneas clear, PERRLA                            Ears: pearly TMs, external ear canals normal                          Nose: septum midline, turbinates swollen, with erythema and clear discharge             Mouth/throat: MMM, tongue normal, mild pharyngeal erythema                           Neck: supple, no adenopathy, no thyromegaly, nontender                          Heart: RRR, normal S1, S2, no murmurs  Lungs: +bronchial breath sounds, no rhonchi, no wheezes, no rales                Extremities: no edema, nontender     Assessment and Plan:   Encounter Diagnoses  Name Primary?  . Cough Yes  . Libido, decreased    Cough - current etiology favors viral bronchitis.  Advise rest, Mucienx DM, hydrate well, and if worse over the next several day with fever, productive sputum, sinus pressure, begin Amoxillin.  Call or return if worse or not improving.  Libido - TST level today, discussed causes, possible treatments

## 2011-10-22 ENCOUNTER — Encounter: Payer: Self-pay | Admitting: Medical

## 2011-10-22 LAB — TESTOSTERONE: Testosterone: 590.19 ng/dL (ref 250–890)

## 2011-11-03 ENCOUNTER — Telehealth: Payer: Self-pay | Admitting: Family Medicine

## 2011-11-03 NOTE — Telephone Encounter (Signed)
Pt called he wants to talk with you about adhd he has talked with a therapist.  I offered him an appt , he said he wanted you to call him first. [phone]

## 2011-11-05 ENCOUNTER — Other Ambulatory Visit: Payer: Self-pay | Admitting: Family Medicine

## 2011-12-15 ENCOUNTER — Other Ambulatory Visit: Payer: Self-pay | Admitting: Family Medicine

## 2012-02-05 ENCOUNTER — Encounter: Payer: Self-pay | Admitting: Family Medicine

## 2012-02-05 DIAGNOSIS — R51 Headache: Secondary | ICD-10-CM

## 2012-05-09 ENCOUNTER — Encounter: Payer: Self-pay | Admitting: Family Medicine

## 2012-05-09 ENCOUNTER — Ambulatory Visit (INDEPENDENT_AMBULATORY_CARE_PROVIDER_SITE_OTHER): Payer: 59 | Admitting: Family Medicine

## 2012-05-09 VITALS — BP 120/88 | HR 68 | Ht 71.0 in | Wt 182.0 lb

## 2012-05-09 DIAGNOSIS — N453 Epididymo-orchitis: Secondary | ICD-10-CM

## 2012-05-09 DIAGNOSIS — N451 Epididymitis: Secondary | ICD-10-CM

## 2012-05-09 DIAGNOSIS — N419 Inflammatory disease of prostate, unspecified: Secondary | ICD-10-CM

## 2012-05-09 DIAGNOSIS — Z23 Encounter for immunization: Secondary | ICD-10-CM

## 2012-05-09 LAB — HEMOCCULT GUIAC POC 1CARD (OFFICE)

## 2012-05-09 MED ORDER — SULFAMETHOXAZOLE-TRIMETHOPRIM 800-160 MG PO TABS: 1.0000 | ORAL_TABLET | Freq: Two times a day (BID) | ORAL | Status: DC

## 2012-05-09 NOTE — Patient Instructions (Signed)
Take the medicine for the for the full 2 weeks and if any symptoms are still there call me for more meds

## 2012-05-09 NOTE — Progress Notes (Signed)
  Subjective:    Patient ID: [name], male    DOB: 01/10/1951, 61 y.o.   MRN: [id number]  HPI He has a one-week history of left testicular pain it has been intermittent. He does note one particular area of increased pain. No discharge, dysuria, polyuria. No sexual activity except with his wife. No difficulty with sexual activity.   Review of Systems     Objective:   Physical Exam Alert and in no distress. Genital exam shows the left epididymis be slightly enlarged and tender. Rectal exam does show a tender boggy prostate to reproduce that epididymal pain.       Assessment & Plan:  Flu shot given with risks and benefits discussed 1. Need for prophylactic vaccination and inoculation against influenza  Flu vaccine greater than or equal to 3yo preservative free IM  2. Prostatitis  Hemoccult - 1 Card (office), sulfamethoxazole-trimethoprim (BACTRIM DS,SEPTRA DS) 800-160 MG per tablet  3. Epididymitis  sulfamethoxazole-trimethoprim (BACTRIM DS,SEPTRA DS) 800-160 MG per tablet   is to call me if continued difficulty when he finishes the course of the antibiotic.

## 2012-05-26 ENCOUNTER — Other Ambulatory Visit: Payer: Self-pay | Admitting: Family Medicine

## 2012-06-06 ENCOUNTER — Ambulatory Visit (INDEPENDENT_AMBULATORY_CARE_PROVIDER_SITE_OTHER): Payer: 59 | Admitting: Family Medicine

## 2012-06-06 ENCOUNTER — Encounter: Payer: Self-pay | Admitting: Family Medicine

## 2012-06-06 VITALS — BP 126/88 | HR 74 | Wt 180.0 lb

## 2012-06-06 DIAGNOSIS — S81819A Laceration without foreign body, unspecified lower leg, initial encounter: Secondary | ICD-10-CM

## 2012-06-06 DIAGNOSIS — T07XXXA Unspecified multiple injuries, initial encounter: Secondary | ICD-10-CM

## 2012-06-06 DIAGNOSIS — S81809A Unspecified open wound, unspecified lower leg, initial encounter: Secondary | ICD-10-CM

## 2012-06-06 DIAGNOSIS — S0190XA Unspecified open wound of unspecified part of head, initial encounter: Secondary | ICD-10-CM

## 2012-06-06 DIAGNOSIS — S81009A Unspecified open wound, unspecified knee, initial encounter: Secondary | ICD-10-CM

## 2012-06-06 DIAGNOSIS — S0191XA Laceration without foreign body of unspecified part of head, initial encounter: Secondary | ICD-10-CM

## 2012-06-06 MED ORDER — HYDROCODONE-ACETAMINOPHEN 5-500 MG PO TABS: 1.0000 | ORAL_TABLET | ORAL | Status: DC | PRN

## 2012-06-06 NOTE — Progress Notes (Signed)
  Subjective:    Patient ID: [name], male    DOB: Jan 16, 1951, 61 y.o.   MRN: [id number]  HPI Approximately 4 days ago he fell down a flight of stairs ,15 steps injuring his entire left side including head shoulders hip knee and foot. He did not go to the emergency room. There was loss of consciousness; he remembers that he missed the step and the next thing he remembers is people helping him get up. He complains of left-sided headache, left shoulder pain, left hip pain left knee and ankle. He has been unable to work. Review of Systems     Objective:   Physical Exam Alert and in  no distress slightly subdued. Laceration present on left parietal area that is healing nicely. Pain on palpation of the left shoulder with good motion of the shoulder. Ribs are nontender to compression. Left hip area does show some swelling and ecchymosis distal to the greater trochanter. Healing laceration to the lateral aspect of his left knee. No effusion. Good motion of his knee       Assessment & Plan:   1. Multiple contusions  HYDROcodone-acetaminophen (VICODIN) 5-500 MG per tablet  2. Leg laceration  HYDROcodone-acetaminophen (VICODIN) 5-500 MG per tablet  3. Laceration of head     I will write for him to be able to return to work at the end of the week. He has a class scheduled for tomorrow which I would prefer that he can

## 2012-06-06 NOTE — Patient Instructions (Signed)
You can take up to 4 ibuprofen 3 times per day and you can also take Tylenol if you need to. I will give you codeine for nighttime

## 2012-06-09 ENCOUNTER — Ambulatory Visit
Admission: RE | Admit: 2012-06-09 | Discharge: 2012-06-09 | Disposition: A | Discharge status: Home / Self Care | Payer: 59 | Source: Ambulatory Visit | Attending: Family Medicine | Admitting: Family Medicine

## 2012-06-09 ENCOUNTER — Other Ambulatory Visit: Payer: Self-pay | Admitting: Family Medicine

## 2012-06-09 ENCOUNTER — Telehealth: Payer: Self-pay | Admitting: Family Medicine

## 2012-06-09 DIAGNOSIS — R51 Headache: Secondary | ICD-10-CM

## 2012-06-09 NOTE — Addendum Note (Signed)
Addended by: Barbette Or A on: 06/09/2012 05:36 PM   Modules accepted: Orders

## 2012-06-09 NOTE — Telephone Encounter (Signed)
He call complaining of a right-sided headache that apparently is getting worse. I will order a CT scan to evaluate this further

## 2012-06-09 NOTE — Telephone Encounter (Signed)
Please call now has dull headache and it hurts when he leans forward  Please call

## 2012-06-13 ENCOUNTER — Other Ambulatory Visit: Payer: 59

## 2012-06-20 ENCOUNTER — Ambulatory Visit (INDEPENDENT_AMBULATORY_CARE_PROVIDER_SITE_OTHER): Payer: 59 | Admitting: Family Medicine

## 2012-06-20 VITALS — BP 130/88 | Wt 183.0 lb

## 2012-06-20 DIAGNOSIS — T07XXXA Unspecified multiple injuries, initial encounter: Secondary | ICD-10-CM

## 2012-06-20 NOTE — Progress Notes (Signed)
  Subjective:    Patient ID: [name], male    DOB: Oct 21, 1950, 61 y.o.   MRN: [id number]  HPI He is here for recheck. He did have a CT scan done to help further evaluate his headaches. It was negative. He states he is now 90-95% better. He still is having some left shoulder and chest discomfort. He was recently diagnosed and treated for left retinal tear.   Review of Systems     Objective:   Physical Exam Alert and in no distress. No tenderness to palpation is noted in the rib or left arm and shoulder area.       Assessment & Plan:   1. Contusion of multiple sites, not elsewhere classified    I reassured him that I thought he would get totally back to normal.

## 2012-07-15 ENCOUNTER — Other Ambulatory Visit (HOSPITAL_COMMUNITY): Payer: Self-pay | Admitting: Cardiovascular Disease

## 2012-07-15 DIAGNOSIS — I714 Abdominal aortic aneurysm, without rupture, unspecified: Secondary | ICD-10-CM

## 2012-07-29 ENCOUNTER — Institutional Professional Consult (permissible substitution): Payer: 59 | Admitting: Family Medicine

## 2012-08-03 ENCOUNTER — Ambulatory Visit (HOSPITAL_COMMUNITY)
Admission: RE | Admit: 2012-08-03 | Discharge: 2012-08-03 | Disposition: A | Discharge status: Home / Self Care | Payer: 59 | Source: Ambulatory Visit | Attending: Cardiovascular Disease | Admitting: Cardiovascular Disease

## 2012-08-03 DIAGNOSIS — I714 Abdominal aortic aneurysm, without rupture, unspecified: Secondary | ICD-10-CM | POA: Insufficient documentation

## 2012-08-03 NOTE — Progress Notes (Signed)
Aorta Duplex completed. [name] D  

## 2012-08-09 ENCOUNTER — Telehealth: Payer: Self-pay | Admitting: Internal Medicine

## 2012-08-09 NOTE — Telephone Encounter (Signed)
Faxed medical records to Harmon Dun and Carmelina Dane, [facility] attorney @ Law @ [phone] and [facility] @ [phone]

## 2012-09-15 ENCOUNTER — Other Ambulatory Visit: Payer: 59

## 2012-09-15 DIAGNOSIS — E782 Mixed hyperlipidemia: Secondary | ICD-10-CM

## 2012-09-15 DIAGNOSIS — R079 Chest pain, unspecified: Secondary | ICD-10-CM

## 2012-09-15 DIAGNOSIS — Z79899 Other long term (current) drug therapy: Secondary | ICD-10-CM

## 2012-09-15 LAB — COMPREHENSIVE METABOLIC PANEL
BUN: 21 mg/dL (ref 6–23)
CO2: 26 mEq/L (ref 19–32)
Creat: 1.14 mg/dL (ref 0.50–1.35)
Glucose, Bld: 97 mg/dL (ref 70–99)
Total Bilirubin: 0.5 mg/dL (ref 0.3–1.2)
Total Protein: 6.8 g/dL (ref 6.0–8.3)

## 2012-09-15 LAB — LIPID PANEL
Cholesterol: 174 mg/dL (ref 0–200)
Total CHOL/HDL Ratio: 3.6 Ratio
Triglycerides: 45 mg/dL (ref <150)
VLDL: 9 mg/dL (ref 0–40)

## 2012-09-16 NOTE — Progress Notes (Signed)
Quick Note:  PT WAS INFORMED AND VERBALIZED UNDERSTANDING AND I FAXED LABS TO [facility] ______

## 2012-10-10 ENCOUNTER — Encounter: Payer: Self-pay | Admitting: Medical

## 2012-10-10 ENCOUNTER — Ambulatory Visit: Payer: 59 | Admitting: Medical

## 2012-10-10 VITALS — BP 130/82 | HR 68 | Temp 97.5°F | Resp 16 | Wt 185.0 lb

## 2012-10-10 DIAGNOSIS — J069 Acute upper respiratory infection, unspecified: Secondary | ICD-10-CM

## 2012-10-10 MED ORDER — HYDROCODONE-HOMATROPINE 5-1.5 MG/5ML PO SYRP: 5.0000 mL | ORAL_SOLUTION | Freq: Three times a day (TID) | ORAL | Status: DC | PRN

## 2012-10-10 NOTE — Progress Notes (Signed)
Subjective: Here for congestion for over a week, coughing, but nothing coming up.  Had sore throat last week, stayed home part of the week due to symptoms.  Has had some sinus pressure, headache, felt malaise about a week ago.  Cough has lingered.  +hoarseness.  Sore throat resolved.  No fever.   Denies NVD, no rash.  Has been taking some left over cough syrup from last visit.  Denies watery and itchy eyes.  Does have sneezing.  No other aggravating or relieving factors.   Past Medical History  Diagnosis Date  . Hypertension   . Allergy   . PVD (peripheral vascular disease)   . Diverticulosis   . Hepatitis C    ROS as in subjective  Objective: Filed Vitals:   10/10/12 1432  BP: 130/82  Pulse: 68  Temp: 97.5 F (36.4 C)  Resp: 16    General appearence: alert, no distress, WD/WN  HEENT: normocephalic, sclerae anicteric, TMs pearly, nares with turbinate edema, clear discharge, mild erythema, pharynx normal Oral cavity: MMM, no lesions Neck: supple, no lymphadenopathy, no thyromegaly, no masses Heart: RRR, normal S1, S2, no murmurs Lungs: CTA bilaterally, no wheezes, rhonchi, or rales Pulses: 2+ symmetric, upper and lower extremities, normal cap refill Ext: no edema  Assessment: Encounter Diagnosis  Name Primary?  . URI (upper respiratory infection) Yes    Plan: Discussed his symptoms, exam, diagnosis of viral URI, discussed supportive care, can use OTC Zyrtec, Mucinex DM, rest, hydrate well, nasal saline, and Hycodan syrup for bad night time cough if needed.  If not improving in 3-4 days, call or return.  Follow-up prn

## 2012-10-10 NOTE — Patient Instructions (Signed)
Consider salt water spray or nasal saline flush in the nostrils twice daily  Consider OTC antihistamine or cough/cold medication such as Robitussin DM, multi symptoms that treats nasal congestion.   Avoid sudafed and strong decongestants.   If unsure, then stick with Zyrtec for antihistamine  Benadryl, Mucinex DM, or Coricidin HBP for cough/congestion.  Increase water intake  You can use some salt water gargles  At bedtime short term for 4 days of less, you can use Afrin spray OTC to help with rest.    If worse or not improving by end of the week, then call back.

## 2012-10-24 ENCOUNTER — Ambulatory Visit (INDEPENDENT_AMBULATORY_CARE_PROVIDER_SITE_OTHER): Payer: 59 | Admitting: Family Medicine

## 2012-10-24 ENCOUNTER — Encounter: Payer: Self-pay | Admitting: Family Medicine

## 2012-10-24 VITALS — BP 140/98 | HR 72 | Wt 180.0 lb

## 2012-10-24 DIAGNOSIS — I10 Essential (primary) hypertension: Secondary | ICD-10-CM

## 2012-10-24 DIAGNOSIS — N529 Male erectile dysfunction, unspecified: Secondary | ICD-10-CM

## 2012-10-24 DIAGNOSIS — M940 Chondrocostal junction syndrome [Tietze]: Secondary | ICD-10-CM

## 2012-10-24 NOTE — Progress Notes (Signed)
  Subjective:    Patient ID: MAKAVELI HOARD, male    DOB: [birth year], 62 y.o.   MRN: [id number]  HPI He has a week long history of left-sided chest burning sensation. No weakness, shortness of breath or diaphoresis. He ate after that but cannot state whether the food made it better or worse. He also had some eructation which she is unaware of whether that helped. He tried Pepto-Bismol which did not help. He states the pain went away in about 2 hours . He's had for 5 more these episodes in the last week. He also complains of a cough. He has seen his cardiologist and his medications were readjusted. Presently he is taking 3 lisinopril per day as well as HCTZ. He also complains of difficulty with erectile dysfunction.   Review of Systems     Objective:   Physical Exam Alert and in no distress. Cardiac exam shows regular rhythm without murmurs or gallops. Lungs are clear to auscultation. Abdominal exam shows no masses or tenderness. He does have some left-sided lower costochondral pain on palpation.       Assessment & Plan:  Costochondritis  ED (erectile dysfunction)  Hypertension He is to use Aleve and heat for the chest wall pain. Reassured him that I did not see any evidence of heart or lung problems. Recommend he hold his lisinopril for the next several days to see if that will help with his cough. Before I treat the ED, I want to resolve the blood pressure issue.

## 2012-10-24 NOTE — Patient Instructions (Addendum)
Stop the lisinopril for the next several days and see what that does for your cough and let me know. Heat to your chest for 20 minutes 3 times per day and 2 Aleve twice a day.

## 2012-12-09 ENCOUNTER — Telehealth: Payer: Self-pay | Admitting: Family Medicine

## 2012-12-09 NOTE — Telephone Encounter (Signed)
Pt called and stated that he is taking a trip to [place]. He states that he spoke to you previously concerning this trip and sleep issues. He states he was to call back closer to trip and you would give him sleep medication. [facility] uses [facility].

## 2012-12-11 NOTE — Telephone Encounter (Signed)
Call in Ambien #12 and warn about alcohol comsumption with med

## 2012-12-12 ENCOUNTER — Telehealth: Payer: Self-pay | Admitting: Family Medicine

## 2012-12-12 ENCOUNTER — Other Ambulatory Visit: Payer: Self-pay

## 2012-12-12 MED ORDER — ZOLPIDEM TARTRATE 10 MG PO TABS: 10.0000 mg | ORAL_TABLET | Freq: Every evening | ORAL | Status: DC | PRN

## 2012-12-12 NOTE — Telephone Encounter (Signed)
[name] and [name] are traveling out of country for 3 weeks. They both need a print out of their current medications( with strength and dosages) to take with them signed by doctor  Call [name] when ready @ [phone]

## 2012-12-12 NOTE — Telephone Encounter (Signed)
I HAVE MADE A LETTER FOR HIM

## 2012-12-12 NOTE — Telephone Encounter (Signed)
Go ahead and get this taken care of

## 2012-12-12 NOTE — Telephone Encounter (Signed)
Called in Ambien per JCL putting warning for alcohol use on bottle

## 2012-12-12 NOTE — Telephone Encounter (Signed)
LEFT MESSAGE THAT MED LIST IS READY UP FRONT

## 2013-01-03 ENCOUNTER — Other Ambulatory Visit: Payer: Self-pay | Admitting: Family Medicine

## 2013-01-13 ENCOUNTER — Encounter: Payer: Self-pay | Admitting: Cardiovascular Disease

## 2013-04-28 ENCOUNTER — Encounter: Payer: Self-pay | Admitting: Family Medicine

## 2013-04-28 ENCOUNTER — Ambulatory Visit (INDEPENDENT_AMBULATORY_CARE_PROVIDER_SITE_OTHER): Payer: 59 | Admitting: Family Medicine

## 2013-04-28 VITALS — BP 120/82 | HR 70 | Wt 181.0 lb

## 2013-04-28 DIAGNOSIS — M79609 Pain in unspecified limb: Secondary | ICD-10-CM

## 2013-04-28 DIAGNOSIS — Z23 Encounter for immunization: Secondary | ICD-10-CM

## 2013-04-28 DIAGNOSIS — M25562 Pain in left knee: Secondary | ICD-10-CM

## 2013-04-28 DIAGNOSIS — M25569 Pain in unspecified knee: Secondary | ICD-10-CM

## 2013-04-28 DIAGNOSIS — M722 Plantar fascial fibromatosis: Secondary | ICD-10-CM

## 2013-04-28 DIAGNOSIS — M79671 Pain in right foot: Secondary | ICD-10-CM

## 2013-04-28 NOTE — Progress Notes (Signed)
  Subjective:    Patient ID: [name], male    DOB: [birth year], 62 y.o.   MRN: [id number]  HPI Possibly 2 months ago he injured his right second toe in an altercation between his dog and some pit bulls. He has had no pain since then. He also complains of right heel discomfort over the last month or so. It bothers him especially when he gets up in the morning or after he is set for a period of time. He also has noted difficulty with left knee discomfort especially posteriorly with full extension of the knee. A popping, locking or grinding.  Review of Systems     Objective:   Physical Exam Exam of left knee shows no effusion no tenderness to palpation. Anterior drawer negative. McMurray's testing negative. Collateral ligaments intact. Right foot exam does show tenderness to palpation over the calcaneal spur. Achilles tendon normal. Slight tenderness to palpation over the head of the second metatarsal as well as a palpable abnormality in that area. X-ray does show probable fracture.       Assessment & Plan:  Need for prophylactic vaccination and inoculation against influenza - Plan: Flu Vaccine QUAD 36+ mos IM  Plantar fasciitis of right foot  Right foot pain  Left knee pain  flu shot given with risks and benefits discussed. I will treat the plantar fasciitis with stretching and discussed heel cups as well as arch supports. If continued difficulty is to return here. No particular therapy for the fractures since it does seem to be healing. It may conservative care for the knee discomfort with strengthening exercises and recheck if continued difficulty.

## 2013-04-28 NOTE — Patient Instructions (Addendum)
Plantar Fasciitis Plantar fasciitis is a common condition that causes foot pain. It is soreness (inflammation) of the band of tough fibrous tissue on the bottom of the foot that runs from the heel bone (calcaneus) to the ball of the foot. The cause of this soreness may be from excessive standing, poor fitting shoes, running on hard surfaces, being overweight, having an abnormal walk, or overuse (this is common in runners) of the painful foot or feet. It is also common in aerobic exercise dancers and ballet dancers. SYMPTOMS  Most people with plantar fasciitis complain of:  Severe pain in the morning on the bottom of their foot especially when taking the first steps out of bed. This pain recedes after a few minutes of walking.  Severe pain is experienced also during walking following a long period of inactivity.  Pain is worse when walking barefoot or up stairs DIAGNOSIS   Your caregiver will diagnose this condition by examining and feeling your foot.  Special tests such as X-rays of your foot, are usually not needed. PREVENTION   Consult a sports medicine professional before beginning a new exercise program.  Walking programs offer a good workout. With walking there is a lower chance of overuse injuries common to runners. There is less impact and less jarring of the joints.  Begin all new exercise programs slowly. If problems or pain develop, decrease the amount of time or distance until you are at a comfortable level.  Wear good shoes and replace them regularly.  Stretch your foot and the heel cords at the back of the ankle (Achilles tendon) both before and after exercise.  Run or exercise on even surfaces that are not hard. For example, asphalt is better than pavement.  Do not run barefoot on hard surfaces.  If using a treadmill, vary the incline.  Do not continue to workout if you have foot or joint problems. Seek professional help if they do not improve. HOME CARE INSTRUCTIONS    Avoid activities that cause you pain until you recover.  Use ice or cold packs on the problem or painful areas after working out.  Only take over-the-counter or prescription medicines for pain, discomfort, or fever as directed by your caregiver.  Soft shoe inserts or athletic shoes with air or gel sole cushions may be helpful.  If problems continue or become more severe, consult a sports medicine caregiver or your own health care provider. Cortisone is a potent anti-inflammatory medication that may be injected into the painful area. You can discuss this treatment with your caregiver. MAKE SURE YOU:   Understand these instructions.  Will watch your condition.  Will get help right away if you are not doing well or get worse. Document Released: 04/07/2001 Document Revised: 10/05/2011 Document Reviewed: 06/06/2008 [facility] Patient Information 2014 [place], [place]. Stretch before you get out of bed in the morning . Push her feet right into shoes that have either a heel cup or arch supports. For the knee do basic knee strengthening exercises and an anti-inflammatory of choice. You can use the anti-inflammatory on your heel to

## 2013-06-05 ENCOUNTER — Ambulatory Visit
Admission: RE | Admit: 2013-06-05 | Discharge: 2013-06-05 | Disposition: A | Discharge status: Home / Self Care | Payer: 59 | Source: Ambulatory Visit | Attending: Family Medicine | Admitting: Family Medicine

## 2013-06-05 ENCOUNTER — Telehealth: Payer: Self-pay | Admitting: Family Medicine

## 2013-06-05 ENCOUNTER — Other Ambulatory Visit: Payer: Self-pay

## 2013-06-05 ENCOUNTER — Encounter: Payer: Self-pay | Admitting: Family Medicine

## 2013-06-05 ENCOUNTER — Ambulatory Visit (INDEPENDENT_AMBULATORY_CARE_PROVIDER_SITE_OTHER): Payer: 59 | Admitting: Family Medicine

## 2013-06-05 VITALS — Wt 185.0 lb

## 2013-06-05 DIAGNOSIS — M21619 Bunion of unspecified foot: Secondary | ICD-10-CM

## 2013-06-05 DIAGNOSIS — M25562 Pain in left knee: Secondary | ICD-10-CM

## 2013-06-05 DIAGNOSIS — M21611 Bunion of right foot: Secondary | ICD-10-CM

## 2013-06-05 DIAGNOSIS — M25569 Pain in unspecified knee: Secondary | ICD-10-CM

## 2013-06-05 DIAGNOSIS — M722 Plantar fascial fibromatosis: Secondary | ICD-10-CM

## 2013-06-05 MED ORDER — CELECOXIB 200 MG PO CAPS: 200.0000 mg | ORAL_CAPSULE | ORAL | Status: DC | PRN

## 2013-06-05 MED ORDER — CELECOXIB 200 MG PO CAPS: 200.0000 mg | ORAL_CAPSULE | Freq: Two times a day (BID) | ORAL | Status: DC

## 2013-06-05 NOTE — Progress Notes (Signed)
  Subjective:    Patient ID: [name], male    DOB: 03-30-1951, 62 y.o.   MRN: [id number]  HPI He is still having difficulty with left knee discomfort. He describes it as a tight feeling he also notes difficulty with full extension of the knee. He points to the medial aspect of his knee . His original injury was in November of last year when he fell down a flight of steps due to lack of proper lighting. He notes that the longer he stands, the more he has difficulty with pain. No popping, locking or grinding. He continues to have right heel pain. He has been using arch supports and doing stretching. He also has a bunion on his right great toe and in the past had used Celebrex for that the   Review of Systems     Objective:   Physical Exam Exam of his left knee shows no effusion. No point tenderness or lipping is noted. Negative anterior drawer. McMurray's testing negative. Medial and lateral collateral ligaments intact. Exam of his right foot does show tenderness palpation over the calcaneal spur. Achilles tendon and retrocalcaneal area normal. Full motion of the ankle. X-ray of his knee is negative. Bunion present on the right first MTP.    Assessment & Plan:  Plantar fasciitis of right foot - Plan: celecoxib (CELEBREX) 200 MG capsule, Ambulatory referral to Physical Therapy  Left knee pain - Plan: DG Knee Complete 4 Views Left, celecoxib (CELEBREX) 200 MG capsule, Ambulatory referral to Physical Therapy  Bunion of great toe of right foot - Plan: celecoxib (CELEBREX) 200 MG capsule  I will refer him to physical therapy for rehabilitation on his knee. Discussed the possibility of getting an MRI at a later date if he does not respond to the rehabilitation. We'll also have him work on his plantar fasciitis. Recommended conservative care for the bunion however if it continues to bother him, he will contact his podiatrist.

## 2013-06-05 NOTE — Patient Instructions (Signed)
After I get the x-ray I will refer you to physical therapy for both the knee and the plantar fasciitis

## 2013-06-05 NOTE — Progress Notes (Signed)
Quick Note:  PT INFORMED OF X-RAY AND ASKED TO BE REFERRED TO [facility] FOR P/T ______

## 2013-06-05 NOTE — Telephone Encounter (Signed)
lm

## 2013-06-12 ENCOUNTER — Ambulatory Visit: Payer: 59 | Attending: Family Medicine | Admitting: Physical Therapy

## 2013-06-12 DIAGNOSIS — R269 Unspecified abnormalities of gait and mobility: Secondary | ICD-10-CM | POA: Insufficient documentation

## 2013-06-12 DIAGNOSIS — M25569 Pain in unspecified knee: Secondary | ICD-10-CM | POA: Insufficient documentation

## 2013-06-12 DIAGNOSIS — M25579 Pain in unspecified ankle and joints of unspecified foot: Secondary | ICD-10-CM | POA: Insufficient documentation

## 2013-06-12 DIAGNOSIS — IMO0001 Reserved for inherently not codable concepts without codable children: Secondary | ICD-10-CM | POA: Insufficient documentation

## 2013-06-14 ENCOUNTER — Ambulatory Visit: Payer: 59 | Admitting: Physical Therapy

## 2013-06-20 ENCOUNTER — Ambulatory Visit: Payer: 59 | Admitting: Rehabilitation

## 2013-06-27 ENCOUNTER — Ambulatory Visit: Payer: 59 | Attending: Family Medicine | Admitting: Physical Therapy

## 2013-06-27 ENCOUNTER — Encounter: Payer: Self-pay | Admitting: Cardiology

## 2013-06-27 ENCOUNTER — Ambulatory Visit (INDEPENDENT_AMBULATORY_CARE_PROVIDER_SITE_OTHER): Payer: 59 | Admitting: Cardiology

## 2013-06-27 VITALS — BP 110/88 | HR 73 | Ht 71.0 in | Wt 184.0 lb

## 2013-06-27 DIAGNOSIS — E785 Hyperlipidemia, unspecified: Secondary | ICD-10-CM

## 2013-06-27 DIAGNOSIS — I739 Peripheral vascular disease, unspecified: Secondary | ICD-10-CM

## 2013-06-27 DIAGNOSIS — I10 Essential (primary) hypertension: Secondary | ICD-10-CM

## 2013-06-27 DIAGNOSIS — E7800 Pure hypercholesterolemia, unspecified: Secondary | ICD-10-CM

## 2013-06-27 DIAGNOSIS — M25579 Pain in unspecified ankle and joints of unspecified foot: Secondary | ICD-10-CM | POA: Insufficient documentation

## 2013-06-27 DIAGNOSIS — R269 Unspecified abnormalities of gait and mobility: Secondary | ICD-10-CM | POA: Insufficient documentation

## 2013-06-27 DIAGNOSIS — M25569 Pain in unspecified knee: Secondary | ICD-10-CM | POA: Insufficient documentation

## 2013-06-27 DIAGNOSIS — IMO0001 Reserved for inherently not codable concepts without codable children: Secondary | ICD-10-CM | POA: Insufficient documentation

## 2013-06-27 MED ORDER — PITAVASTATIN CALCIUM 2 MG PO TABS: 1.0000 mg | ORAL_TABLET | ORAL | Status: DC

## 2013-06-27 MED ORDER — PITAVASTATIN CALCIUM 1 MG PO TABS: 1.0000 | ORAL_TABLET | ORAL | Status: DC

## 2013-06-27 NOTE — Patient Instructions (Signed)
You seem to be doing great!. Blood pressure looks good.   If you can take at least 1 Livalo once weekly & try to increase to 2 x weekly that should be enough to bring the cholesterol levels down.  I think we can have you f/u in 1 yr.  We will recheck labs in 6 months.  Marykay Lex, MD

## 2013-06-28 ENCOUNTER — Encounter: Payer: Self-pay | Admitting: Cardiology

## 2013-06-29 ENCOUNTER — Encounter: Payer: Self-pay | Admitting: Cardiology

## 2013-06-29 DIAGNOSIS — I700 Atherosclerosis of aorta: Secondary | ICD-10-CM | POA: Insufficient documentation

## 2013-06-29 DIAGNOSIS — E785 Hyperlipidemia, unspecified: Secondary | ICD-10-CM | POA: Insufficient documentation

## 2013-06-29 NOTE — Assessment & Plan Note (Signed)
I don't see any documentation of any symptoms of PVD. He doesn't have any claudication symptoms on my evaluation. This required further review of his paper chart to see if there was anything found on arterial Dopplers. However I would not be only concerned as he is not having symptoms at this time. Simply treat risk factors.

## 2013-06-29 NOTE — Assessment & Plan Note (Signed)
His most recent lipids showed a total cholesterol of 174, HDL 48, LDL 117, TG 45 -- that is pretty well controlled based on his risks. Since there is a reported history of peripheral vascular disease, that would mean that our goal LDL should be less than 100. The plan had been turned down below. He initially didn't take it, and it, an as-needed basis. I just asked him to try to get one to 2 times a week. I asked that he start with once a week with some samples as tolerated he can increase it to twice a week and 1 mg dose.  Recheck labs in 6 months.

## 2013-06-29 NOTE — Progress Notes (Signed)
PATIENT: [name] MRN: [id number] DOB: 1950-11-19 PCP: Carollee Herter, MD  Clinic Note: Chief Complaint  Patient presents with  . Follow-up    6 months:  RAW to [facility]:  No chest pain, SOB, edema or dizziness.   HPI: [name] is a 62 y.o. male with a PMH below who presents today for six-month/establishment of new cardiologist at the retirement of Dr. Alanda Amass. He is a former patient of Dr. Chanda Busing before he retired. He is being followed for cardiac risk factors of hypertension, hyperlipidemia and had a catheterization performed in 2005 for chest discomfort which only showed minor coronary disease. He also negative Myoview in 2011. He was last seen by Dr. Alanda Amass in June of this year following a trip to [place] with his wife.  Interval History: Today presents with no real symptoms of a cardiac standpoint. He says that he really rushes up and down 20 stairs he may get little short of breath. He has residual in backup for the [facility] after doing physical therapy for a foot and knee injury versus related to arthritis pains. He really denies any symptoms of any chest tightness or pressure with rest restriction. No dyspnea at rest or exertion.  The remainder of cardiac review of systems is as follows: Cardiovascular ROS: no chest pain or dyspnea on exertion negative for - chest pain, edema, irregular heartbeat, loss of consciousness, murmur, orthopnea, palpitations, paroxysmal nocturnal dyspnea, rapid heart rate or shortness of breath : Additional cardiac review of systems: Lightheadedness - no, dizziness - no, syncope/near-syncope - no; TIA/amaurosis fugax - no Melena - no, hematochezia no; hematuria - no; nosebleeds - no; claudication - no  Past Medical History  Diagnosis Date  . Hypertension   . Allergy   . PVD (peripheral vascular disease)   . Diverticulosis   . Hepatitis C     chronic  . HLD (hyperlipidemia)     statin intolerant (Crestor & Simvastatin)    Prior  Cardiac Evaluation and Past Surgical History: Past Surgical History  Procedure Laterality Date  . Tonsillectomy    . Cardiac catheterization  2005    30% Cx.   [name] Nm myoview ltd  2011    Neg Ischemia or infarct.  . Transthoracic echocardiogram  2013    Normal EF. No significant Valve Disease    No Known Allergies  Current Outpatient Prescriptions  Medication Sig Dispense Refill  . aspirin 81 MG tablet Take 81 mg by mouth daily.      [name] b complex vitamins capsule Take 1 capsule by mouth daily.        . celecoxib (CELEBREX) 200 MG capsule Take 200 mg by mouth 2 (two) times daily as needed.      . hydrochlorothiazide (MICROZIDE) 12.5 MG capsule Take 12.5 mg by mouth daily.      [name] lisinopril (PRINIVIL,ZESTRIL) 20 MG tablet Take 40 mg by mouth 2 (two) times daily.       . pantoprazole (PROTONIX) 40 MG tablet Take 40 mg by mouth daily as needed.       . verapamil (CALAN-SR) 240 MG CR tablet TAKE 1 TABLET BY MOUTH EVERY DAY  30 tablet  PRN  . Pitavastatin Calcium (LIVALO) 2 MG TABS Take 0.5 tablets (1 mg total) by mouth once a week.  14 tablet  0  . Pitavastatin Calcium 1 MG TABS Take 1 tablet (1 mg total) by mouth 2 (two) times a week.  30 tablet  4   No  current facility-administered medications for this visit.    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     Wife is retired Charity fundraiser from [employer].   He walks roughly 2-3 miles in bed time at least 2-3 times a week. He quit smoking 30 years ago.    family history is not on file.  ROS: A comprehensive Review of Systems - Negative except Mild erectile dysfunction. Arthritis pains in his right foot and left knee. he also notes at least 2 episodes of nocturia nightly  PHYSICAL EXAM BP 110/88  Pulse 73  Ht 5\' 11"  (1.803 m)  Wt 83.462 kg (184 lb)  BMI 25.67 kg/m2 General appearance: alert and oriented x3, cooperative, appears stated age, no distress and  Well-nourished/well groomed. Normal mood and affect. Answers questions appropriately. Neck: no adenopathy, no carotid bruit, no JVD, supple, symmetrical, trachea midline and thyroid not enlarged, symmetric, no tenderness/mass/nodules Lungs: clear to auscultation bilaterally, normal percussion bilaterally and Nonlabored, good air movement Heart: normal apical impulse, S1, S2 normal, no S3 or S4 and No R./G. 1/6 SEM at the base Abdomen: soft, non-tender; bowel sounds normal; no masses,  no organomegaly Extremities: extremities normal, atraumatic, no cyanosis or edema;  Pulses: 2+ and symmetric Skin: Skin color, texture, turgor normal. No rashes or lesion; 2 tone and color change in the face the scalp. Neurologic: Grossly normal  [id number] today: Yes Rate: 73  , Rhythm: NSR, septal Q waves otherwise no significant changes and normal ECG.   Recent Labs:  no recent labs   ASSESSMENT / PLAN:  Hypertension Well-controlled on verapamil and lisinopril plus HCTZ.  HLD (hyperlipidemia) His most recent lipids showed a total cholesterol of 174, HDL 48, LDL 117, TG 45 -- that is pretty well controlled based on his risks. Since there is a reported history of peripheral vascular disease, that would mean that our goal LDL should be less than 100. The plan had been turned down below. He initially didn't take it, and it, an as-needed basis. I just asked him to try to get one to 2 times a week. I asked that he start with once a week with some samples as tolerated he can increase it to twice a week and 1 mg dose.  Recheck labs in 6 months.  PVD (peripheral vascular disease) I don't see any documentation of any symptoms of PVD. He doesn't have any claudication symptoms on my evaluation. This required further review of his paper chart to see if there was anything found on arterial Dopplers. However I would not be only concerned as he is not having symptoms at this time. Simply treat risk  factors.    Orders Placed This Encounter  Procedures  . Lipid panel    Standing Status: Future     Number of Occurrences: 1     Standing Expiration Date: 06/27/2014  . Basic metabolic panel    Standing Status: Future     Number of Occurrences: 1     Standing Expiration Date: 06/27/2014  . Hepatic function panel    Standing Status: Future     Number of Occurrences: 1     Standing Expiration Date: 06/27/2014  . EKG 12-Lead   Meds ordered this encounter  Medications  . aspirin 81 MG tablet    Sig: Take 81 mg by mouth daily.  . celecoxib (CELEBREX) 200 MG capsule    Sig: Take 200 mg by mouth 2 (two)  times daily as needed.  . Pitavastatin Calcium 1 MG TABS    Sig: Take 1 tablet (1 mg total) by mouth 2 (two) times a week.    Dispense:  30 tablet    Refill:  4  . Pitavastatin Calcium (LIVALO) 2 MG TABS    Sig: Take 0.5 tablets (1 mg total) by mouth once a week.    Dispense:  14 tablet    Refill:  0    Followup: 12 months; Lipids in 6 months  Danny Yackley W. Herbie Baltimore, M.D., M.S. [facility] [address] [place], [place]  [address]  [phone] Pager # [phone]

## 2013-06-29 NOTE — Assessment & Plan Note (Signed)
Well-controlled on verapamil and lisinopril plus HCTZ.

## 2013-07-03 ENCOUNTER — Ambulatory Visit: Payer: 59 | Admitting: Rehabilitation

## 2013-07-05 ENCOUNTER — Encounter: Payer: 59 | Admitting: Rehabilitation

## 2013-07-10 ENCOUNTER — Encounter: Payer: 59 | Admitting: Rehabilitation

## 2013-07-12 ENCOUNTER — Ambulatory Visit: Payer: 59 | Admitting: Physical Therapy

## 2013-07-12 ENCOUNTER — Telehealth: Payer: Self-pay | Admitting: Family Medicine

## 2013-07-12 NOTE — Telephone Encounter (Signed)
Please call, patient states physical therapy has contacted you about gettting orthotics for him

## 2013-07-13 NOTE — Telephone Encounter (Signed)
Dr.[name] I have put the paper Mr.Aikins was talking about on your desk

## 2013-07-13 NOTE — Telephone Encounter (Signed)
Called pt to inform him we have not received anything on this he said he was going to fax me a copy

## 2013-07-13 NOTE — Telephone Encounter (Signed)
I have no data on this

## 2013-07-18 ENCOUNTER — Ambulatory Visit: Payer: 59 | Admitting: Physical Therapy

## 2013-07-24 ENCOUNTER — Ambulatory Visit: Payer: 59 | Admitting: Physical Therapy

## 2013-07-24 ENCOUNTER — Telehealth: Payer: Self-pay | Admitting: Family Medicine

## 2013-07-24 MED ORDER — LISINOPRIL 20 MG PO TABS: 40.0000 mg | ORAL_TABLET | Freq: Two times a day (BID) | ORAL | Status: DC

## 2013-07-24 NOTE — Telephone Encounter (Signed)
Patient needs refill on lisinopril   Pt is out of meds, pharmacy states they contacted our office but have not received response

## 2013-07-26 ENCOUNTER — Encounter: Payer: 59 | Admitting: Physical Therapy

## 2013-07-28 ENCOUNTER — Other Ambulatory Visit: Payer: Self-pay | Admitting: *Deleted

## 2013-07-28 MED ORDER — HYDROCHLOROTHIAZIDE 12.5 MG PO CAPS: 12.5000 mg | ORAL_CAPSULE | Freq: Every day | ORAL | Status: DC

## 2013-07-28 NOTE — Telephone Encounter (Signed)
Rx was sent to pharmacy electronically. 

## 2013-08-01 ENCOUNTER — Telehealth: Payer: Self-pay | Admitting: Internal Medicine

## 2013-08-01 ENCOUNTER — Telehealth: Payer: Self-pay | Admitting: Cardiology

## 2013-08-01 MED ORDER — VERAPAMIL HCL ER 240 MG PO TBCR: 240.0000 mg | EXTENDED_RELEASE_TABLET | Freq: Every day | ORAL | Status: DC

## 2013-08-01 MED ORDER — LISINOPRIL 40 MG PO TABS: 40.0000 mg | ORAL_TABLET | Freq: Every day | ORAL | Status: DC

## 2013-08-01 NOTE — Telephone Encounter (Signed)
Paper chart received and pt was taking lisinopril 20 mg tabs: three tabs daily, verapamil 240 mg daily and HCTZ 25 mg daily.    Called pt back and reviewed meds.  Pt at work now and able to talk.  Stated he stopped taking lisinopril 60 mg and was taking 40 mg daily when he saw Dr. Ellyn Hack.  Stated he told him that was fine.  Also stated he is taking HCTZ 25 mg daily and not 12.5 mg daily.  Stated the verapamil dose is correct.    Pt informed RN will confirm with Dr. Ellyn Hack and send in scripts to pharmacy.  Informed RN will call him back after response from Dr. Ellyn Hack.  Pt verbalized understanding and agreed w/ plan.  Message forwarded to Dr. Ellyn Hack.

## 2013-08-01 NOTE — Telephone Encounter (Signed)
Returned call.  Left message to call back before 4pm.  Rx(s) sent to pharmacy for updated lisinopril and verapamil dosages.

## 2013-08-01 NOTE — Telephone Encounter (Signed)
Pt would like to know what blood pressure Dr Ellyn Hack put him on as of December 2nd? Please fax him the name of the medicine and the directions on how he is to take it.Please fax to [phone]

## 2013-08-01 NOTE — Telephone Encounter (Signed)
Returned call and pt verified x 2.  Pt informed message received and RN cannot fax any information w/o a signed ROI on file.  Pt verbalized understanding.  Pt informed RN can discuss meds over the phone if he'd like.  Pt agreed.   Pt stated he was taking HCTZ 12.5 mg daily, verapamil 240 mg daily and lisinopril 60 mg daily.  Stated when he saw Dr. Ellyn Hack he said it was okay for him to take 40 mg daily and that's what he has been taking.  Pt informed las Rxs for lisinopril and verapamil came from Dr. Lanice Shirts office and lisinopril is listed as 40 mg twice daily.  Pt stated that is incorrect.  Pt informed RN will have to pull his paper chart to get more information and call him back.  Pt verbalized understanding and agreed w/ plan.  Paper chart requested.

## 2013-08-01 NOTE — Telephone Encounter (Signed)
Pt states he went to PT  for his foot and knee and they said he needs orthotics and they found a cracked bone in a toe. Does pt need to see a podiatrist first about his toe and orthotics?

## 2013-08-01 NOTE — Telephone Encounter (Signed)
On fine with the filling the prescriptions as is. Lisinopril 40 mg is fine along with the verapamil to 40 mg and a half dose HCTZ  [name], MD

## 2013-08-01 NOTE — Telephone Encounter (Signed)
12.5 is fine.  [name], MD

## 2013-08-02 ENCOUNTER — Ambulatory Visit: Payer: 59 | Attending: Family Medicine | Admitting: Physical Therapy

## 2013-08-02 DIAGNOSIS — M25569 Pain in unspecified knee: Secondary | ICD-10-CM | POA: Insufficient documentation

## 2013-08-02 DIAGNOSIS — IMO0001 Reserved for inherently not codable concepts without codable children: Secondary | ICD-10-CM | POA: Insufficient documentation

## 2013-08-02 DIAGNOSIS — M25579 Pain in unspecified ankle and joints of unspecified foot: Secondary | ICD-10-CM | POA: Insufficient documentation

## 2013-08-02 DIAGNOSIS — R269 Unspecified abnormalities of gait and mobility: Secondary | ICD-10-CM | POA: Insufficient documentation

## 2013-08-03 ENCOUNTER — Telehealth: Payer: Self-pay | Admitting: Cardiology

## 2013-08-03 NOTE — Telephone Encounter (Signed)
Returning your call .[name] Please call [phone]..  Thanks

## 2013-08-03 NOTE — Telephone Encounter (Signed)
Returned call.  Left message to call back tomorrow before 4pm.  

## 2013-08-03 NOTE — Telephone Encounter (Signed)
Returned call.  Left message w/ instructions by MD to take HCTZ 12.5 mg and that Rxs were sent to pharmacy w/ updated directions.  Call back before 4pm if questions.

## 2013-08-04 ENCOUNTER — Ambulatory Visit (INDEPENDENT_AMBULATORY_CARE_PROVIDER_SITE_OTHER): Payer: 59 | Admitting: Family Medicine

## 2013-08-04 ENCOUNTER — Encounter: Payer: Self-pay | Admitting: Family Medicine

## 2013-08-04 VITALS — BP 132/84 | HR 79 | Wt 185.0 lb

## 2013-08-04 DIAGNOSIS — M21611 Bunion of right foot: Secondary | ICD-10-CM

## 2013-08-04 DIAGNOSIS — M25562 Pain in left knee: Secondary | ICD-10-CM

## 2013-08-04 DIAGNOSIS — M722 Plantar fascial fibromatosis: Secondary | ICD-10-CM

## 2013-08-04 DIAGNOSIS — Z1211 Encounter for screening for malignant neoplasm of colon: Secondary | ICD-10-CM

## 2013-08-04 DIAGNOSIS — M25569 Pain in unspecified knee: Secondary | ICD-10-CM

## 2013-08-04 DIAGNOSIS — K5900 Constipation, unspecified: Secondary | ICD-10-CM

## 2013-08-04 DIAGNOSIS — M21619 Bunion of unspecified foot: Secondary | ICD-10-CM

## 2013-08-04 NOTE — Addendum Note (Signed)
Addended by: Lise Auer on: 08/04/2013 09:51 AM   Modules accepted: Orders

## 2013-08-04 NOTE — Telephone Encounter (Signed)
Returned call.  Left message to call back before 4pm.  

## 2013-08-04 NOTE — Progress Notes (Signed)
   Subjective:    Patient ID: [name], male    DOB: 01/25/1951, 63 y.o.   MRN: [id number]  HPI He is here for a discussion concerning multiple issues. He has been involved with physical therapy or his left knee. They also have fitted him for an orthotic to help with his foot pain and bunion. He was also told that he might possibly need an injection to help with his knee pain. No correspondence is in the record. He also said some slight left flank pain but now this has diminished. He has had difficulty with constipation having to strain but is still having regular solid BMs. His last colonoscopy was 2012.   Review of Systems     Objective:   Physical Exam Alert and in no distress otherwise not examined       Assessment & Plan:  Left knee pain  Plantar fasciitis of right foot  Bunion of great toe of right foot  Unspecified constipation - Plan: Ambulatory referral to Gastroenterology  I will get information from physical therapy concerning his knee pain. Encouraged him to use her that it to help with his right foot discomfort. Review his record indicates no x-ray was done of the bunion was deathly noted. Discussed constipation regard to fluids, bulk in his diet, exercise. Will also set him up for colonoscopy more for routine purposes.

## 2013-08-09 ENCOUNTER — Encounter: Payer: 59 | Admitting: Physical Therapy

## 2013-08-18 ENCOUNTER — Encounter: Payer: Self-pay | Admitting: Internal Medicine

## 2013-08-18 ENCOUNTER — Telehealth: Payer: Self-pay | Admitting: Family Medicine

## 2013-08-21 NOTE — Telephone Encounter (Signed)
Pt aware appt sch 

## 2013-08-22 ENCOUNTER — Encounter: Payer: Self-pay | Admitting: Family Medicine

## 2013-08-22 ENCOUNTER — Ambulatory Visit (INDEPENDENT_AMBULATORY_CARE_PROVIDER_SITE_OTHER): Payer: 59 | Admitting: Family Medicine

## 2013-08-22 VITALS — BP 130/91 | Ht 71.0 in | Wt 180.0 lb

## 2013-08-22 DIAGNOSIS — M79609 Pain in unspecified limb: Secondary | ICD-10-CM

## 2013-08-22 DIAGNOSIS — M25562 Pain in left knee: Secondary | ICD-10-CM

## 2013-08-22 DIAGNOSIS — M722 Plantar fascial fibromatosis: Secondary | ICD-10-CM

## 2013-08-22 DIAGNOSIS — M25569 Pain in unspecified knee: Secondary | ICD-10-CM

## 2013-08-22 DIAGNOSIS — M79671 Pain in right foot: Secondary | ICD-10-CM

## 2013-08-22 MED ORDER — METHYLPREDNISOLONE ACETATE 40 MG/ML IJ SUSP
40.0000 mg | Freq: Once | INTRAMUSCULAR | Status: AC
  Administered 2013-08-22: 40 mg via INTRA_ARTICULAR

## 2013-08-22 NOTE — Patient Instructions (Addendum)
Thank you for coming in today  We injected your left knee, I think that this will definitely help Ice the knee tonight for 15 min Compression is a good idea Continue exercises from therapy Biking is really good for knee pain  For your foot pain, Exercises and stretches for plantar fasciitis This is VERY STUBBORN Try modified insoles  Followup 3 weeks

## 2013-08-22 NOTE — Progress Notes (Signed)
CC: Right foot pain and left knee pain HPI: Patient is a very pleasant 63 year old male who presents today for evaluation of right foot pain and left knee pain. He states that his right foot has bothered him for the last 2 months. He notes that he has multiple different problems at the foot. He has plantar fasciitis in the right foot as well as pain in his right second toe and a right foot bunion. He is currently wearing over-the-counter support insoles and also try physical therapy for 6 weeks. Unfortunately this did not really help his foot pain. He notes that he has significant pain in the morning when he first gets going. He states that his second toe hurts under the ball of the foot and feels like he is walking on a rock.  His left knee has been bothering him for the last year. He notes that it started when he fell down a flight of stairs one year ago and his knee bent backwards underneath him and twisted. Pain is anterior and medial and occasionally posterior. He has pain with full extension. He has tried an over-the-counter pain patch as well as ibuprofen. Pain is intermittent and is worse if he is on his feet a lot. He does occasionally get some swelling. He feels like his knee is going to give out at times.   ROS: As above in the HPI. All other systems are stable or negative.  OBJECTIVE: APPEARANCE:  Patient in no acute distress.The patient appeared well nourished and normally developed. HEENT: No scleral icterus. Conjunctiva non-injected Resp: Non labored Skin: No rash MSK:  Left Knee - Inspection normal with no erythema or effusion or obvious bony abnormalities.  - Palpation with tenderness at the anteromedial joint line.  - ROM normal in flexion and extension. - Strength 5/5 in flexion and extension. - Ligaments with solid consistent endpoints including ACL, PCL, LCL, MCL.  - Pain with Mcmurray's.  - Neurovascularly intact  Right Foot exam:  - On inspection, there is noted to  be prominent right foot bunion.  - When standing, patient has pes planus with pronation. - With barefoot walking patient is noted to pronate.  - Slightly decreased range of motion of the great toe - Ankle range of motion is full without pain. - Strength is 5 out of 5 in ankle and foot.  - Neurovascular status normal. - Tender over the calcaneal attachment of the plantar fascia  MSK [place]: Not performed  Outside radiographs that were nonweightbearing were reviewed today. These show no obvious fracture or dislocation. Unable to assess joint space narrowing as it is nonweightbearing  ASSESSMENT: #1. Left knee pain, favor degenerative meniscal tear versus osteoarthritis flare #2. Right plantar fasciitis #3. Pes planus and pronation #4. Metatarsalgia #5. Prominent right bunion  PLAN: #1. Patient seems to continue to have a lot of difficulty with his left knee despite conservative measures including medications and physical therapy. He would like to go forward with injection at this time. Risks and benefits were discussed and patient agreeable to injection as below. I also encouraged him to try to use his compression sleeve. If he does not respond as expected we will repeat x-rays standing films. #2. For his plantar fasciitis, I corrected his over-the-counter insoles to hopefully reduce his pronation. Scaphoid pad for added. He was also given home exercises to do for plantar fasciitis. I explained to him that this is a long and stubborn problem and he should be patient with his recovery. #3.  He does have significant changes in his foot with the bunion and loss of transverse arch. A metatarsal pad was added to his right shoe. A first ray post was also added to the right insole as well as the scaphoid pad as above. I've asked him to try this for the next several weeks. He was encouraged to wear the sport insoles at all times when possible. I will see him back in 3 weeks for reevaluation.   Greater than  45 minutes was spent in this encounter for evaluation of the above complaints, shoe modification, and instruction of home rehab program.

## 2013-09-13 ENCOUNTER — Encounter: Payer: Self-pay | Admitting: Family Medicine

## 2013-09-13 ENCOUNTER — Other Ambulatory Visit: Payer: Self-pay | Admitting: *Deleted

## 2013-09-13 ENCOUNTER — Ambulatory Visit (INDEPENDENT_AMBULATORY_CARE_PROVIDER_SITE_OTHER): Payer: 59 | Admitting: Family Medicine

## 2013-09-13 VITALS — BP 142/92 | Ht 71.0 in | Wt 180.0 lb

## 2013-09-13 DIAGNOSIS — M722 Plantar fascial fibromatosis: Secondary | ICD-10-CM

## 2013-09-13 DIAGNOSIS — M7741 Metatarsalgia, right foot: Secondary | ICD-10-CM

## 2013-09-13 DIAGNOSIS — M25562 Pain in left knee: Secondary | ICD-10-CM

## 2013-09-13 DIAGNOSIS — M25569 Pain in unspecified knee: Secondary | ICD-10-CM

## 2013-09-13 DIAGNOSIS — M79671 Pain in right foot: Secondary | ICD-10-CM

## 2013-09-13 DIAGNOSIS — M79609 Pain in unspecified limb: Secondary | ICD-10-CM

## 2013-09-13 DIAGNOSIS — M7750 Other enthesopathy of unspecified foot: Secondary | ICD-10-CM

## 2013-09-13 MED ORDER — HYDROCHLOROTHIAZIDE 12.5 MG PO CAPS: 25.0000 mg | ORAL_CAPSULE | Freq: Every day | ORAL | Status: DC

## 2013-09-13 NOTE — Patient Instructions (Signed)
Thank you for coming in today  I am glad you are doing so much better  If your knee pain returns, please call and I will order standing xrays We can repeat an injection after 3 months if needed.  For your foot pain, continue exercises Bring your dress shoes with insert and we will modify them as well.  Followup as needed

## 2013-09-13 NOTE — Progress Notes (Signed)
CC: Followup left knee and right foot pain HPI: Patient is a very pleasant 44 rolled male who presents for followup today. His last visit I suspected that he had left knee osteoarthritis versus degenerative tear of the meniscus. He failed conservative management with anti-inflammatories and physical therapy so we went forward with corticosteroid injection. He states that this helped him tremendously. He is much better at this point. Occasionally he will get a twinge of pain but overall he is doing quite well. For his right foot pain, he had metatarsalgia as well as plantar fasciitis. We did make modifications to his sport insoles which he finds tremendously helpful. He can still feel the spot underneath his metatarsal heads where he feels like he is walking on something but this is improving it is not nearly as painful. His plantar fasciitis is almost resolved. He's been very good about doing his home exercise plan.  ROS: As above in the HPI. All other systems are stable or negative.  OBJECTIVE: APPEARANCE:  Patient in no acute distress.The patient appeared well nourished and normally developed. HEENT: No scleral icterus. Conjunctiva non-injected Resp: Non labored Skin: No rash MSK:  Right foot: - Full range of motion at the foot and ankle - Non-tender over the metatarsals - Non-tender at the medial calcaneal attachment of the plantar fascia - Normal foot dorsiflexion   MSK [place]: Not performed   ASSESSMENT: #1. Left knee pain due to to mild osteoarthritis versus degenerative tear of the meniscus, currently resolved #2. Right foot metatarsalgia and plantar fasciitis, resolving   PLAN: #1. I did discuss with Mr. Leward Quan that I am very pleased to hear that he is doing so much better with regards to his left knee pain. We do not know at this point how long he corticosteroid shot will last. Hopefully his pain will not return he'll get long-lasting relief. However, if his pain does return I would  like to obtain standing x-rays to assess the degree of arthritis. We may consider repeat corticosteroid injection in the future if needed. He was encouraged to take over-the-counter anti-inflammatories on days when he may have a low bit of knee pain.  #2. I'm glad to see that his right foot is feeling so much better. We did discuss possibly increasing the size of his metatarsal pad but he is pretty happy with where he is right now. He would like to be able to start wearing some of his stress shoes again but he has not tolerated this is he cannot put his sport insoles in them. However he does note that he  has a thin insole in his stress shoes. I've asked him to return at his convenience with his shoes and dress insole and we would modify them similar to his sport insoles.   Modifications to  insoles include: - Scaphoid pads - small - Right metatarsal pad - small - Right first ray post

## 2013-09-13 NOTE — Telephone Encounter (Signed)
Rx was sent to pharmacy electronically. 

## 2013-09-15 LAB — HM COLONOSCOPY

## 2013-09-27 ENCOUNTER — Encounter: Payer: Self-pay | Admitting: Physician Assistant

## 2013-09-27 ENCOUNTER — Ambulatory Visit (INDEPENDENT_AMBULATORY_CARE_PROVIDER_SITE_OTHER): Payer: 59 | Admitting: Physician Assistant

## 2013-09-27 VITALS — BP 122/92 | HR 61 | Ht 71.0 in | Wt 182.4 lb

## 2013-09-27 DIAGNOSIS — I959 Hypotension, unspecified: Secondary | ICD-10-CM | POA: Insufficient documentation

## 2013-09-27 DIAGNOSIS — R5381 Other malaise: Secondary | ICD-10-CM

## 2013-09-27 DIAGNOSIS — R5383 Other fatigue: Secondary | ICD-10-CM

## 2013-09-27 LAB — CBC
HCT: 42.3 % (ref 39.0–52.0)
Hemoglobin: 14.8 g/dL (ref 13.0–17.0)
MCH: 31.4 pg (ref 26.0–34.0)
MCHC: 35 g/dL (ref 30.0–36.0)
MCV: 89.6 fL (ref 78.0–100.0)
Platelets: 316 10*3/uL (ref 150–400)
RBC: 4.72 MIL/uL (ref 4.22–5.81)
RDW: 13.6 % (ref 11.5–15.5)
WBC: 4.6 10*3/uL (ref 4.0–10.5)

## 2013-09-27 MED ORDER — LISINOPRIL 20 MG PO TABS: 20.0000 mg | ORAL_TABLET | Freq: Every day | ORAL | Status: DC

## 2013-09-27 NOTE — Progress Notes (Signed)
Date:  09/27/2013   ID:  [name], DOB 1950-08-29, MRN [id number]  PCP:  Wyatt Haste, MD  Primary Cardiologist:  Ellyn Hack     History of Present Illness: [name] is a 63 y.o. male [name] is a 63 y.o. male with a PMH of hypertension, peripheral vascular disease, diverticulosis, hepatitis C, HLD. He had a catheterization performed in 2005 for chest discomfort which only showed minor coronary disease. He also negative Myoview in 2011.  Exercising [place] [facility] 3x[facility].  They patient presents with feeling of sluggishness, decreased energy and no "bounce".  He also feels his memory is not as good.  He had a normal head CT in 2013.  No FH of dementia/arrhythmia but his mother died in her 28's from MI.   He reports last week after walking 1.5 miles he felt lightheaded.  BP was 90/60.  Recent colonoscopy with diverticulosis.    + mild abd/left rib pain-intermittent.  The patient currently denies nausea, vomiting, fever, chest pain, shortness of breath, orthopnea, PND, cough, congestion,  hematochezia, melena, lower extremity edema, claudication.  Wt Readings from Last 3 Encounters:  09/27/13 182 lb 6.4 oz (82.736 kg)  09/13/13 180 lb (81.647 kg)  08/22/13 180 lb (81.647 kg)     Past Medical History  Diagnosis Date  . Hypertension   . Allergy   . PVD (peripheral vascular disease)   . Diverticulosis   . Hepatitis C     chronic  . HLD (hyperlipidemia)     statin intolerant (Crestor & Simvastatin)    Current Outpatient Prescriptions  Medication Sig Dispense Refill  . aspirin 81 MG tablet Take 81 mg by mouth daily.      [name] b complex vitamins capsule Take 1 capsule by mouth daily.        . celecoxib (CELEBREX) 200 MG capsule Take 200 mg by mouth 2 (two) times daily as needed.      . hydrochlorothiazide (MICROZIDE) 12.5 MG capsule Take 2 capsules (25 mg total) by mouth daily.  60 capsule  9  . lisinopril (PRINIVIL,ZESTRIL) 20 MG tablet Take 1 tablet (20 mg total) by  mouth daily.  30 tablet  5  . Multiple Vitamin (MULTIVITAMIN WITH MINERALS) TABS tablet Take 1 tablet by mouth daily.      . pantoprazole (PROTONIX) 40 MG tablet Take 40 mg by mouth daily as needed.       . Pitavastatin Calcium (LIVALO) 2 MG TABS Take 0.5 tablets (1 mg total) by mouth once a week.  14 tablet  0  . verapamil (CALAN-SR) 240 MG CR tablet Take 1 tablet (240 mg total) by mouth daily.  30 tablet  5   No current facility-administered medications for this visit.    Allergies:   No Known Allergies  Social History:  The patient  reports that he quit smoking about 25 years ago. His smoking use included Cigars. He has never used smokeless tobacco. He reports that he drinks alcohol. He reports that he does not use illicit drugs.   Family history:  History reviewed. No pertinent family history.  ROS:  Please see the history of present illness.  All other systems reviewed and negative.   PHYSICAL EXAM: VS:  BP 122/92  Pulse 61  Ht 5\' 11"  (1.803 m)  Wt 182 lb 6.4 oz (82.736 kg)  BMI 25.45 kg/m2 Well nourished, well developed, in no acute distress HEENT: Pupils are equal round react to light accommodation extraocular movements  are intact.  Neck: no JVDNo cervical lymphadenopathy. Cardiac: Regular rate and rhythm without murmurs rubs or gallops. Lungs:  clear to auscultation bilaterally, no wheezing, rhonchi or rales Abd: soft, mild tenderness left mid axillary line near abd, positive bowel sounds all quadrants, no hepatosplenomegaly Ext: no lower extremity edema.  2+ radial and dorsalis pedis pulses. Skin: warm and dry Neuro:  Grossly normal  EKG:  NSR anterior Q. Inf TWI.  No changes   ASSESSMENT AND PLAN:  Problem List Items Addressed This Visit   Fatigue - Primary     The patient does not appear to be anemic on exam.. He just had a colonoscopy which reveal diverticulosis.  Will check a CBC to be sure.   He is now taking Vit D3.  He may just be over medicated.  He is down to  180 pounds and exercising more.      Relevant Orders      EKG 12-Lead      CBC   Hypotension     Decrease lisinopril to 20mg  daily.  Continue to monitor BP.    Relevant Medications      lisinopril (PRINIVIL,ZESTRIL) tablet

## 2013-09-27 NOTE — Assessment & Plan Note (Signed)
The patient does not appear to be anemic on exam.. He just had a colonoscopy which reveal diverticulosis.  Will check a CBC to be sure.   He is now taking Vit D3.  He may just be over medicated.  He is down to 180 pounds and exercising more.

## 2013-09-27 NOTE — Assessment & Plan Note (Signed)
Decrease lisinopril to 20mg  daily.  Continue to monitor BP.

## 2013-09-27 NOTE — Patient Instructions (Signed)
1.  Decrease lisinopril to 20mg  daily. 2.  Follow up with Dr. Ellyn Hack in May

## 2013-09-29 ENCOUNTER — Encounter: Payer: Self-pay | Admitting: Internal Medicine

## 2013-10-04 ENCOUNTER — Encounter: Payer: 59 | Admitting: Internal Medicine

## 2014-01-15 ENCOUNTER — Other Ambulatory Visit: Payer: Self-pay | Admitting: Internal Medicine

## 2014-01-15 DIAGNOSIS — B1920 Unspecified viral hepatitis C without hepatic coma: Secondary | ICD-10-CM

## 2014-01-18 ENCOUNTER — Other Ambulatory Visit: Payer: Self-pay | Admitting: Family Medicine

## 2014-01-18 ENCOUNTER — Telehealth: Payer: Self-pay | Admitting: Family Medicine

## 2014-01-18 MED ORDER — LISINOPRIL 20 MG PO TABS: ORAL_TABLET | ORAL | Status: DC

## 2014-01-18 NOTE — Telephone Encounter (Signed)
He is taking a total of 40 mg lisinopril per day

## 2014-01-22 ENCOUNTER — Telehealth: Payer: Self-pay

## 2014-01-22 MED ORDER — LISINOPRIL 20 MG PO TABS: ORAL_TABLET | ORAL | Status: DC

## 2014-01-22 NOTE — Telephone Encounter (Signed)
Error

## 2014-01-24 ENCOUNTER — Other Ambulatory Visit: Payer: 59

## 2014-01-25 ENCOUNTER — Ambulatory Visit
Admission: RE | Admit: 2014-01-25 | Discharge: 2014-01-25 | Disposition: A | Discharge status: Home / Self Care | Payer: 59 | Source: Ambulatory Visit | Attending: Internal Medicine | Admitting: Internal Medicine

## 2014-01-25 DIAGNOSIS — B1920 Unspecified viral hepatitis C without hepatic coma: Secondary | ICD-10-CM

## 2014-01-30 ENCOUNTER — Telehealth: Payer: Self-pay | Admitting: Internal Medicine

## 2014-01-30 NOTE — Telephone Encounter (Signed)
I am not sure why he was referred to neurology and will need to notes so I know why he was sent there

## 2014-01-30 NOTE — Telephone Encounter (Signed)
Wife called and states that pt was referred to [facility] for neurology and does not like the provider and would like a referral to go to Dr. Antony Contras, phone # there is [phone]. Please call wife and let her know

## 2014-01-30 NOTE — Telephone Encounter (Signed)
DR.[name] I LOOKED IN PT CHART UNDER MEDIA WE REFERRED HIM BACK LATE 2012 FOR HAND TREMORS

## 2014-01-31 NOTE — Telephone Encounter (Signed)
Go ahead and make the referral 

## 2014-02-01 ENCOUNTER — Other Ambulatory Visit: Payer: Self-pay

## 2014-02-01 DIAGNOSIS — R251 Tremor, unspecified: Secondary | ICD-10-CM

## 2014-02-01 NOTE — Telephone Encounter (Signed)
PUT ORDER IN PER DR.[name] FOR NEW NEURO.

## 2014-02-01 NOTE — Telephone Encounter (Signed)
PUT ORDER IN

## 2014-02-01 NOTE — Telephone Encounter (Signed)
DONE

## 2014-02-12 ENCOUNTER — Ambulatory Visit (INDEPENDENT_AMBULATORY_CARE_PROVIDER_SITE_OTHER): Payer: 59 | Admitting: Neurology

## 2014-02-12 ENCOUNTER — Encounter: Payer: Self-pay | Admitting: Neurology

## 2014-02-12 VITALS — BP 134/91 | HR 70 | Ht 71.0 in | Wt 180.8 lb

## 2014-02-12 DIAGNOSIS — G20A1 Parkinson's disease without dyskinesia, without mention of fluctuations: Secondary | ICD-10-CM

## 2014-02-12 DIAGNOSIS — R259 Unspecified abnormal involuntary movements: Secondary | ICD-10-CM

## 2014-02-12 DIAGNOSIS — R251 Tremor, unspecified: Secondary | ICD-10-CM | POA: Insufficient documentation

## 2014-02-12 DIAGNOSIS — G20 Parkinson's disease: Secondary | ICD-10-CM | POA: Insufficient documentation

## 2014-02-12 MED ORDER — CARBIDOPA-LEVODOPA 25-100 MG PO TABS: 0.5000 | ORAL_TABLET | Freq: Two times a day (BID) | ORAL | Status: DC

## 2014-02-12 NOTE — Patient Instructions (Signed)
I had a long discussion with the patient and his wife regarding his tremors and parkinsonian symptoms discuss plan for evaluation, treatment and answered questions. I will review prior neurological workup [facility] and will hold off and not doing any tests that is done. Trial of Sinemet 25/100 half a tablet twice daily for a week increase to 3 tabs daily and then further as tolerated. I discussed side effects with the patient and his wife and advised him to call me if needed. Return for followup in 4 weeks or earlier if necessary. The patient was also briefly examined by Dr. Janann Colonel movement disorder neurologist who agreed with my assessment and treatment plan.  Parkinson Disease Parkinson disease is a disorder of the central nervous system, which includes the brain and spinal cord. A person with this disease slowly loses the ability to completely control body movements. Within the brain, there is a group of nerve cells (basal ganglia) that help control movement. The basal ganglia are damaged and do not work properly in a person with Parkinson disease. In addition, the basal ganglia produce and use a brain chemical called dopamine. The dopamine chemical sends messages to other parts of the body to control and coordinate body movements. Dopamine levels are low in a person with Parkinson disease. If the dopamine levels are low, then the body does not receive the correct messages it needs to move normally.  CAUSES  The exact reason why the basal ganglia get damaged is not known. Some medical researchers have thought that infection, genes, environment, and certain medicines may contribute to the cause.  SYMPTOMS   An early symptom of Parkinson disease is often an uncontrolled shaking (tremor) of the hands. The tremor will often disappear when the affected hand is consciously used.  As the disease progresses, walking, talking, getting out of a chair, and new movements become more  difficult.  Muscles get stiff and movements become slower.  Balance and coordination become harder.  Depression, trouble swallowing, urinary problems, constipation, and sleep problems can occur.  Later in the disease, memory and thought processes may deteriorate. DIAGNOSIS  There are no specific tests to diagnose Parkinson disease. You may be referred to a neurologist for evaluation. Your caregiver will ask about your medical history, symptoms, and perform a physical exam. Blood tests and imaging tests of your brain may be performed to rule out other diseases. The imaging tests may include an MRI or a CT scan. TREATMENT  The goal of treatment is to relieve symptoms. Medicines may be prescribed once the symptoms become troublesome. Medicine will not stop the progression of the disease, but medicine can make movement and balance better and help control tremors. Speech and occupational therapy may also be prescribed. Sometimes, surgical treatment of the brain can be done in young people. HOME CARE INSTRUCTIONS  Get regular exercise and rest periods during the day to help prevent exhaustion and depression.  If getting dressed becomes difficult, replace buttons and zippers with Velcro and elastic on your clothing.  Take all medicine as directed by your caregiver.  Install grab bars or railings in your home to prevent falls.  Go to speech or occupational therapy as directed.  Keep all follow-up visits as directed by your caregiver. SEEK MEDICAL CARE IF:  Your symptoms are not controlled with your medicine.  You fall.  You have trouble swallowing or choke on your food. MAKE SURE YOU:  Understand these instructions.  Will watch your condition.  Will get help  right away if you are not doing well or get worse. Document Released: 07/10/2000 Document Revised: 11/07/2012 Document Reviewed: 08/12/2011 [facility] Patient Information 2015 [place], [place]. This information is not intended to  replace advice given to you by your health care provider. Make sure you discuss any questions you have with your health care provider.

## 2014-02-12 NOTE — Progress Notes (Signed)
[facility] [address] [place]. [place] [address] [phone]       OFFICE CONSULT NOTE  Mr. [name] Date of Birth:  02/22/1951 Medical Record Number:  [id number]   Referring MD: Jill Alexanders  Reason for Referral:  Tremors  HPI: 14 year African American male who since last year and a half has noticed increasing tremors mainly in the left arm and leg and to a lesser degree in the right arm as well. He states the tremors were quite mild but became more pronounced after a minor  accident while staying [place] the rental [place] in [place]. He fell down 12 flights of steps and hit a concrete slab and had a concussion. He and some minor bruises and knee injury which took several months to reck of her period CT scan of the head was done 2 days later which was unremarkable. Since then he has had some walking difficulty but this may be related to his knee pain. He says that his feet do at times gets stuck in his started walking with a stooped posture. He however does not describe typical festination. He has resting and intermittent action tremor claiming the left arm and leg the tremor does improve with activities. The tremor does not seem to interfere with most of his routine. He denies significant bradykinesia, and drooling of saliva or micrographia. He has been evaluated by neurologists Dr. Reginia Forts at [facility] neurology but he is unable to tell me for the diagnosis was. He was not told that this may be Parkinson's and was not tried on dopaminergic medications. He did have an MRI scan and some lab work but I do not have those results for my review available today. Patient is here today for a second opinion as he feels his tremor is not getting better. He does admit to some light masonry hallucinations as well as restless sleep thrashing of his legs. He denies significant memory or cognitive difficulties. He has not noticed any particular effect of alcohol on his  tremor. Is no family history of tremors. Patient does have chronic hepatitis C and is planning to start treatment with dapsone. He has had no seizures, significant head injury with major loss of consciousness, stroke, TIAs or significant neurological problems.  ROS:   14 system review of systems is positive for fatigue, ringing in the ears, snoring, blurred vision, aching muscles memory loss, confusion, weakness, tremor, decreased energy and restless legs.  PMH:  Past Medical History  Diagnosis Date  . Hypertension   . Allergy   . PVD (peripheral vascular disease)   . Diverticulosis   . Hepatitis C     chronic  . HLD (hyperlipidemia)     statin intolerant (Crestor & Simvastatin)    Social History:  History   Social History  . Marital Status: Married    Spouse Name: barbra gen    Number of Children: 3  . Years of Education: AS   Occupational History  . self employed    Social History Main Topics  . Smoking status: Former Smoker    Types: Cigars    Quit date: 07/27/1988  . Smokeless tobacco: Never Used  . Alcohol Use: Yes     Comment: 2-3 drinks per week.  . Drug Use: No  . Sexual Activity: Yes   Other Topics Concern  . 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  Wife is retired Therapist, sports from [employer].   He walks roughly 2-3 miles in bed time at least 2-3 times a week. He quit smoking 30 years ago.    Medications:   Current Outpatient Prescriptions on File Prior to Visit  Medication Sig Dispense Refill  . aspirin 81 MG tablet Take 81 mg by mouth daily.      . celecoxib (CELEBREX) 200 MG capsule Take 200 mg by mouth 2 (two) times daily as needed.      . hydrochlorothiazide (MICROZIDE) 12.5 MG capsule Take 2 capsules (25 mg total) by mouth daily.  60 capsule  9  . lisinopril (PRINIVIL,ZESTRIL) 20 MG tablet TAKE 2 TABLETS BY MOUTH 2 TIMES DAILY.  120 tablet  11  . Multiple Vitamin (MULTIVITAMIN  WITH MINERALS) TABS tablet Take 1 tablet by mouth daily.      . pantoprazole (PROTONIX) 40 MG tablet Take 40 mg by mouth daily as needed.       . Pitavastatin Calcium (LIVALO) 2 MG TABS Take 0.5 tablets (1 mg total) by mouth once a week.  14 tablet  0  . verapamil (CALAN-SR) 240 MG CR tablet Take 1 tablet (240 mg total) by mouth daily.  30 tablet  5   No current facility-administered medications on file prior to visit.    Allergies:  No Known Allergies  Physical Exam General: Pleasant middle-aged African American male seated, in no evident distress Head: head normocephalic and atraumatic. Orohparynx benign Neck: supple with no carotid or supraclavicular bruits. Hold macular rash over the midline lower neck anteriorly Cardiovascular: regular rate and rhythm, no murmurs Musculoskeletal: no deformity Skin:  no rash/petichiae Vascular:  Normal pulses all extremities Filed Vitals:   02/12/14 0830  BP: 134/91  Pulse: 70    Neurologic Exam Mental Status: Awake and fully alert. Oriented to place and time. Recent and remote memory intact. Recall 2/3. Animal naming 11. Attention span, concentration and fund of knowledge appropriate. Mood and affect appropriate. Glabellar tap negative. No release reflexes Cranial Nerves: Fundoscopic exam reveals sharp disc margins. Pupils equal, briskly reactive to light. Extraocular movements full without nystagmus. Visual fields full to confrontation. Hearing intact. Facial sensation intact. Face, tongue, palate moves normally and symmetrically. Mild masked faces. Diminished facial expression. Motor: Normal bulk and tone. Normal strength in all tested extremity muscles. Intermittent resting left arm and leg tremors. Very occasional mild right upper extremity tremor. The tremor is present at rest , has a slight pill-rolling characteristic, and improves with position holding but is pronounced when he gets up or holds objects in the hand. He was asked to write a  sentence and did so without micrographia. He was asked to join 2 points to make a straight line and draw a spiral and showed mild tremor on the left. There is no significant bradykinesia. He is able to get up with arms folded across his chest. There is early fatigability on rapid repetitive finger tapping on the left and foot tapping on the left. Sensory.: intact to touch and pinprick and vibratory sensation.  Coordination: Rapid alternating movements normal in all extremities. Finger-to-nose and heel-to-shin performed accurately bilaterally. Gait and Station: Arises from chair without difficulty. Stance is slightly stooped Gait demonstrates normal stride length and balance . Able to heel, toe and tandem walk with slight difficulty. Poor postural balance to threat and will follow backwards if not caught Reflexes: 1+ and symmetric. Toes downgoing.     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 I will [facility] and will hold off and not doing any tests that is done. Trial of Sinemet 25/100 half a tablet twice daily for a week increase to 3 tabs daily and then further as tolerated. I discussed side effects with the patient and his wife and advised him to call me if needed. Return for followup in 4 weeks or earlier if necessary. The patient was also briefly examined by Dr. Janann Colonel movement disorder neurologist who agreed with my assessment and treatment plan.   Note: This document was prepared with digital dictation and possible smart phrase technology. Any transcriptional errors that result from this process are unintentional.

## 2014-03-05 ENCOUNTER — Telehealth: Payer: Self-pay | Admitting: Neurology

## 2014-03-06 NOTE — Telephone Encounter (Signed)
I called pt and he is having surgery on his eye ( laser and also to remove wrinkle on eye membrane).  Questioning whether sinemet 25-100,  1/2 tab po tid that he is taking will interfere with the surgery?  Tremors are better he states since on medication.   Surgery not scheduled yet.  Has appt 03-12-14 at 1000 with Dr. [name] as RV.

## 2014-03-07 NOTE — Telephone Encounter (Signed)
No I do not think Sinemet will interfere with surgery and he needs to continue it if it is helping him.

## 2014-03-08 NOTE — Telephone Encounter (Signed)
I called pt and relayed Dr/ Sethi's message below, that he did not believe that sinemet would interfere with his surgery and if it helps to continue.  He verbalized understanding.

## 2014-03-12 ENCOUNTER — Ambulatory Visit: Payer: 59 | Admitting: Neurology

## 2014-03-13 ENCOUNTER — Encounter: Payer: Self-pay | Admitting: Neurology

## 2014-03-13 ENCOUNTER — Ambulatory Visit (INDEPENDENT_AMBULATORY_CARE_PROVIDER_SITE_OTHER): Payer: 59 | Admitting: Neurology

## 2014-03-13 ENCOUNTER — Other Ambulatory Visit: Payer: Self-pay | Admitting: Ophthalmology

## 2014-03-13 ENCOUNTER — Encounter (HOSPITAL_COMMUNITY): Payer: Self-pay | Admitting: *Deleted

## 2014-03-13 VITALS — BP 152/95 | HR 67 | Ht >= 80 in | Wt 181.0 lb

## 2014-03-13 DIAGNOSIS — G3184 Mild cognitive impairment, so stated: Secondary | ICD-10-CM

## 2014-03-13 DIAGNOSIS — R259 Unspecified abnormal involuntary movements: Secondary | ICD-10-CM

## 2014-03-13 DIAGNOSIS — R251 Tremor, unspecified: Secondary | ICD-10-CM

## 2014-03-13 NOTE — Patient Instructions (Signed)
The patient seems to have shown some improvement on low-dose Sinemet and is tolerating it well without side effects. I recommend he increase it to 1 tablet 3 times daily by increasing it slowly over the next 10 days. I discussed possible side effects the patient advised him to call me if needed. I also asked him to start taking fish oil daily as well as to cognitively challenging activities like playing bridge, solving crossword puzzles, sudoku to help with his mild cognitive impairment. Return for followup in 3 months with Charlott Holler, NP or call earlier if necessary Mild Neurocognitive Disorder Mild neurocognitive disorder (formerly known as mild cognitive impairment) is a mental disorder. It is a slight abnormal decrease in mental function. The areas of mental function affected may include memory, thought, communication, behavior, and completion of tasks. The decrease is noticeable and measurable but for the most part does not interfere with your daily activities. Mild neurocognitive disorder typically occurs in people older than 60 years but can occur earlier. It is not as serious as major neurocognitive disorder (formerly known as dementia) but may lead to a more serious neurocognitive disorder. However, in some cases the condition does not get worse. A few people with this disorder even improve. CAUSES  There are a number of different causes of mild neurocognitive disorder:   Brain disorders associated with abnormal protein deposits, such as Alzheimer's disease, Pick's disease, and Lewy body disease.  Brain disorders associated with abnormal movement, such as Parkinson's disease and Huntington's disease.  Diseases affecting blood vessels in the brain and resulting in mini-strokes.  Certain infections, such as human immunodeficiency virus (HIV) infection.  Traumatic brain injury.  Other medical conditions such as brain tumors, underactive thyroid (hypothyroidism), and vitamin B12  deficiency.  Use of certain prescription medicine and "recreational" drugs. SYMPTOMS  Symptoms of mild neurocognitive disorder include:  Difficulty remembering. You may forget details of recent events, names, or phone numbers. You may forget important social events and appointments or repeatedly forget where you put your car keys.  Difficulty thinking and solving problems. You may have trouble with complex tasks such as paying bills or driving in unfamiliar locations.  Difficulty communicating. You may have trouble finding the right word, naming an object, forming a sentence that makes sense, or understanding what you read or hear.  Changes in your behavior or personality. You may lose interest in the things that you used to enjoy or withdraw from social situations. You may get angry more easily than usual. You may act before thinking. You may do things in public that you would not usually do. You may hear or see things that are not real (hallucinations). You may believe falsely that others are trying to hurt you (paranoia). DIAGNOSIS Mild neurocognitive disorder is diagnosed through an assessment by your health care provider. Your health care provider will ask you and your family, friends, or coworkers questions about your symptoms. He or she will ask how often the symptoms occur, how long they have been occurring, whether they are getting worse, and the effect they are having on your life. Your health care provider may refer you to a neurologist or mental health specialist for a detailed evaluation of your mental functions (neuropsychological testing).  To identify the cause of your mild neurocognitive disorder, your health care provider may:  Obtain a detailed medical history.  Ask about alcohol and drug use, including prescription medicine.  Perform a physical exam.  Order blood tests and brain imaging exams. TREATMENT  Mild neurocognitive disorder caused by infections, use of certain  medicines or "recreational" drugs, and certain medical conditions may improve with treatment of the condition that is causing the disorder. Mild neurocognitive disorder resulting from other causes generally does not improve and may worsen. In these cases, the goal of treatment is to slow progression of the disorder and help you cope with the loss of mental function. Treatments in these cases include:   Medicine. Medicine helps mainly with memory loss and behavioral symptoms.   Talk therapy. Talk therapy provides education, emotional support, memory aids, and other ways of making up for decreases in mental function.   Lifestyle changes. These include regular exercise, a healthy diet (including essential omega-3 fatty acids), intellectual stimulation, and increased social interaction. Document Released: 03/15/2013 Document Revised: 11/27/2013 Document Reviewed: 03/15/2013 [facility] Patient Information 2015 [place], [place]. This information is not intended to replace advice given to you by your health care provider. Make sure you discuss any questions you have with your health care provider.

## 2014-03-13 NOTE — Progress Notes (Signed)
[facility] [address] [place]. [place] [address] [phone]       OFFICE CONSULT NOTE  [name] Date of Birth:  1950-12-27 Medical Record Number:  [id number]   Referring MD: Jill Alexanders  Reason for Referral:  Tremors  HPI: 31 year African American male who since last year and a half has noticed increasing tremors mainly in the left arm and leg and to a lesser degree in the right arm as well. He states the tremors were quite mild but became more pronounced after a minor  accident while staying [place] the rental [place] in [place]. He fell down 12 flights of steps and hit a concrete slab and had a concussion. He and some minor bruises and knee injury which took several months to reck of her period CT scan of the head was done 2 days later which was unremarkable. Since then he has had some walking difficulty but this may be related to his knee pain. He says that his feet do at times gets stuck in his started walking with a stooped posture. He however does not describe typical festination. He has resting and intermittent action tremor claiming the left arm and leg the tremor does improve with activities. The tremor does not seem to interfere with most of his routine. He denies significant bradykinesia, and drooling of saliva or micrographia. He has been evaluated by neurologists Dr. Reginia Forts at [facility] neurolog[facility] but he is unable to tell me for the diagnosis was. He was not told that this may be Parkinson's and was not tried on dopaminergic medications. He did have an MRI scan and some lab work but I do not have those results for my review available today. Patient is here today for a second opinion as he feels his tremor is not getting better. He does admit to some light masonry hallucinations as well as restless sleep thrashing of his legs. He denies significant memory or cognitive difficulties. He has not noticed any particular effect of alcohol on his  tremor. Is no family history of tremors. Patient does have chronic hepatitis C and is planning to start treatment with dapsone. He has had no seizures, significant head injury with major loss of consciousness, stroke, TIAs or significant neurological problems. Update 03/13/2014 : He returns for followup after his initial consultation form 02/12/14. He reports slight improvement in his tremors after taking Sinemet 25/100 half a tablet 3 times daily. He is tolerating it well without dizziness, sleepiness, nightmares or hallucinations. Initially had occasional dull headaches which seemed to have stopped. His wife feels that his tremors are much less. The patient however is also complaining of mild short-term memory difficulties with his hand over the years. He particularly has difficulty remembering recent information. He denies any excessive bruising, gait balance problems, difficulty getting out of a chair or wearing his clothes. He does have some restless sleep occasionally. I am yet to receive his old neurological records from[facility] ROS:   14 system review of systems is positive for runny nose, blurred vision, cough, frequent urination, memory loss, headache, tremors, acting out dreams PMH:  Past Medical History  Diagnosis Date  . Hypertension   . Allergy   . PVD (peripheral vascular disease)   . Diverticulosis   . Hepatitis C     chronic  . HLD (hyperlipidemia)     statin intolerant (Crestor & Simvastatin)    Social History:  History   Social History  .  Marital Status: Married    Spouse Name: barbra gen    Number of Children: 3  . Years of Education: AS   Occupational History  . self employed    Social History Main Topics  . Smoking status: Former Smoker    Types: Cigars    Quit date: 07/27/1988  . Smokeless tobacco: Never Used  . Alcohol Use: Yes     Comment: 2-3 drinks per week.  . Drug Use: No  . Sexual Activity: Yes   Other Topics Concern  . 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     Wife is retired Therapist, sports from [employer].   He walks roughly 2-3 miles in bed time at least 2-3 times a week. He quit smoking 30 years ago.    Medications:   Current Outpatient Prescriptions on File Prior to Visit  Medication Sig Dispense Refill  . aspirin 81 MG tablet Take 81 mg by mouth daily.      . calcium carbonate (OS-CAL) 600 MG TABS tablet Take 600 mg by mouth daily with breakfast.      . carbidopa-levodopa (SINEMET) 25-100 MG per tablet Take 0.5 tablets by mouth 2 (two) times daily. Twice daily x 1 week then three times a daily  60 tablet  2  . celecoxib (CELEBREX) 200 MG capsule Take 200 mg by mouth 2 (two) times daily as needed.      . hydrochlorothiazide (MICROZIDE) 12.5 MG capsule Take 2 capsules (25 mg total) by mouth daily.  60 capsule  9  . lisinopril (PRINIVIL,ZESTRIL) 20 MG tablet TAKE 2 TABLETS BY MOUTH 2 TIMES DAILY.  120 tablet  11  . Multiple Vitamin (MULTIVITAMIN WITH MINERALS) TABS tablet Take 1 tablet by mouth daily.      . pantoprazole (PROTONIX) 40 MG tablet Take 40 mg by mouth daily as needed.       . Pitavastatin Calcium (LIVALO) 2 MG TABS Take 0.5 tablets (1 mg total) by mouth once a week.  14 tablet  0  . verapamil (CALAN-SR) 240 MG CR tablet Take 1 tablet (240 mg total) by mouth daily.  30 tablet  5   No current facility-administered medications on file prior to visit.    Allergies:  No Known Allergies  Physical Exam General: Pleasant middle-aged African American male seated, in no evident distress Head: head normocephalic and atraumatic. Orohparynx benign Neck: supple with no carotid or supraclavicular bruits. Hold macular rash over the midline lower neck anteriorly Cardiovascular: regular rate and rhythm, no murmurs Musculoskeletal: no deformity Skin:  no rash/petichiae Vascular:  Normal pulses all extremities Filed Vitals:   03/13/14 1147  BP:  152/95  Pulse: 67    Neurologic Exam Mental Status: Awake and fully alert. Oriented to place and time. Recent and remote memory intact. Recall 2/3. Animal naming 16. Attention span, concentration and fund of knowledge appropriate. Mood and affect appropriate. Glabellar tap negative. No release reflexes Cranial Nerves: Fundoscopic exam reveals sharp disc margins. Pupils equal, briskly reactive to light. Extraocular movements full without nystagmus. Visual fields full to confrontation. Hearing intact. Facial sensation intact. Face, tongue, palate moves normally and symmetrically. Mild masked faces. Diminished facial expression. Motor: Normal bulk and tone. Normal strength in all tested extremity muscles. Intermittent resting left arm and leg tremors. Very occasional mild right upper extremity tremor. The tremor is present at rest , has a slight pill-rolling characteristic, and improves with  position holding but is pronounced when he gets up or holds objects in the hand. He was asked to write a sentence and did so without micrographia. He was asked to join 2 points to make a straight line and draw a spiral and showed mild tremor on the left. There is no significant bradykinesia. He is able to get up with arms folded across his chest. There is early fatigability on rapid repetitive finger tapping on the left and foot tapping on the left. Sensory.: intact to touch and pinprick and vibratory sensation.  Coordination: Rapid alternating movements normal in all extremities. Finger-to-nose and heel-to-shin performed accurately bilaterally. Gait and Station: Arises from chair without difficulty. Stance is slightly stooped Gait demonstrates normal stride length and balance . Able to heel, toe and tandem walk with slight difficulty. Fair postural balance to threat and will  Not fall backwards  Reflexes: 1+ and symmetric. Toes downgoing.     ASSESSMENT: 89 year African American male with a year and a half history  of resting left arm and leg tremors with mild features of early left left sided Parkinson's disease with slight response to Sinemet    PLAN: I had a long discussion with the patient and his wife regarding his tremors and parkinsonian symptoms discuss plan for  treatment and answered questions. I will review [facility] and will hold off and not doing any tests that is done. Increase Sinemet 25/100  tablet   to 3 tabs daily  I discussed side effects with the patient and his wife and advised him to call me if needed. Return for followup in 3 months with Charlott Holler, NP for call earlier if necessary  Note: This document was prepared with digital dictation and possible smart phrase technology. Any transcriptional errors that result from this process are unintentional.

## 2014-03-14 ENCOUNTER — Ambulatory Visit (HOSPITAL_COMMUNITY): Payer: 59

## 2014-03-14 ENCOUNTER — Other Ambulatory Visit: Payer: Self-pay | Admitting: Ophthalmology

## 2014-03-14 ENCOUNTER — Ambulatory Visit: Payer: Self-pay | Admitting: Ophthalmology

## 2014-03-14 ENCOUNTER — Encounter (HOSPITAL_COMMUNITY): Payer: 59 | Admitting: Certified Registered Nurse Anesthetist

## 2014-03-14 ENCOUNTER — Ambulatory Visit (HOSPITAL_COMMUNITY)
Admission: RE | Admit: 2014-03-14 | Discharge: 2014-03-14 | Disposition: A | Discharge status: Home / Self Care | Payer: 59 | Source: Ambulatory Visit | Attending: Ophthalmology | Admitting: Ophthalmology

## 2014-03-14 ENCOUNTER — Encounter (HOSPITAL_COMMUNITY): Payer: Self-pay | Admitting: Ophthalmology

## 2014-03-14 ENCOUNTER — Telehealth: Payer: Self-pay | Admitting: Neurology

## 2014-03-14 ENCOUNTER — Emergency Department (HOSPITAL_COMMUNITY)
Admission: EM | Admit: 2014-03-14 | Discharge: 2014-03-14 | Disposition: A | Discharge status: Home / Self Care | Payer: 59 | Attending: Emergency Medicine | Admitting: Emergency Medicine

## 2014-03-14 ENCOUNTER — Encounter (HOSPITAL_COMMUNITY)
Admission: RE | Disposition: A | Discharge status: Home / Self Care | Payer: Self-pay | Source: Ambulatory Visit | Attending: Ophthalmology

## 2014-03-14 ENCOUNTER — Ambulatory Visit (HOSPITAL_COMMUNITY): Payer: 59 | Admitting: Certified Registered Nurse Anesthetist

## 2014-03-14 ENCOUNTER — Encounter (HOSPITAL_COMMUNITY): Payer: Self-pay | Admitting: Emergency Medicine

## 2014-03-14 DIAGNOSIS — Z7982 Long term (current) use of aspirin: Secondary | ICD-10-CM | POA: Insufficient documentation

## 2014-03-14 DIAGNOSIS — H35379 Puckering of macula, unspecified eye: Secondary | ICD-10-CM | POA: Insufficient documentation

## 2014-03-14 DIAGNOSIS — IMO0002 Reserved for concepts with insufficient information to code with codable children: Secondary | ICD-10-CM | POA: Insufficient documentation

## 2014-03-14 DIAGNOSIS — E785 Hyperlipidemia, unspecified: Secondary | ICD-10-CM | POA: Insufficient documentation

## 2014-03-14 DIAGNOSIS — Z87891 Personal history of nicotine dependence: Secondary | ICD-10-CM | POA: Insufficient documentation

## 2014-03-14 DIAGNOSIS — Z87828 Personal history of other (healed) physical injury and trauma: Secondary | ICD-10-CM | POA: Diagnosis not present

## 2014-03-14 DIAGNOSIS — Z8719 Personal history of other diseases of the digestive system: Secondary | ICD-10-CM | POA: Diagnosis not present

## 2014-03-14 DIAGNOSIS — Z872 Personal history of diseases of the skin and subcutaneous tissue: Secondary | ICD-10-CM | POA: Diagnosis not present

## 2014-03-14 DIAGNOSIS — Z8679 Personal history of other diseases of the circulatory system: Secondary | ICD-10-CM | POA: Insufficient documentation

## 2014-03-14 DIAGNOSIS — R339 Retention of urine, unspecified: Secondary | ICD-10-CM | POA: Insufficient documentation

## 2014-03-14 DIAGNOSIS — I10 Essential (primary) hypertension: Secondary | ICD-10-CM | POA: Insufficient documentation

## 2014-03-14 DIAGNOSIS — Z9889 Other specified postprocedural states: Secondary | ICD-10-CM | POA: Diagnosis not present

## 2014-03-14 DIAGNOSIS — H349 Unspecified retinal vascular occlusion: Secondary | ICD-10-CM | POA: Diagnosis not present

## 2014-03-14 DIAGNOSIS — H35372 Puckering of macula, left eye: Secondary | ICD-10-CM

## 2014-03-14 DIAGNOSIS — Z79899 Other long term (current) drug therapy: Secondary | ICD-10-CM | POA: Diagnosis not present

## 2014-03-14 DIAGNOSIS — Z8619 Personal history of other infectious and parasitic diseases: Secondary | ICD-10-CM | POA: Diagnosis not present

## 2014-03-14 DIAGNOSIS — B182 Chronic viral hepatitis C: Secondary | ICD-10-CM | POA: Insufficient documentation

## 2014-03-14 DIAGNOSIS — R109 Unspecified abdominal pain: Secondary | ICD-10-CM | POA: Diagnosis not present

## 2014-03-14 HISTORY — PX: PARS PLANA VITRECTOMY: SHX2166

## 2014-03-14 HISTORY — PX: MEMBRANE PEEL: SHX5967

## 2014-03-14 HISTORY — DX: Concussion with loss of consciousness of unspecified duration, initial encounter: S06.0X9A

## 2014-03-14 HISTORY — DX: Plantar fascial fibromatosis: M72.2

## 2014-03-14 HISTORY — DX: Reserved for concepts with insufficient information to code with codable children: IMO0002

## 2014-03-14 HISTORY — DX: Concussion with loss of consciousness status unknown, initial encounter: S06.0XAA

## 2014-03-14 LAB — CBC
HEMATOCRIT: 44.6 % (ref 39.0–52.0)
Hemoglobin: 15.6 g/dL (ref 13.0–17.0)
MCH: 31.2 pg (ref 26.0–34.0)
MCHC: 35 g/dL (ref 30.0–36.0)
MCV: 89.2 fL (ref 78.0–100.0)
Platelets: 234 10*3/uL (ref 150–400)
RBC: 5 MIL/uL (ref 4.22–5.81)
RDW: 12.2 % (ref 11.5–15.5)
WBC: 3.8 10*3/uL — ABNORMAL LOW (ref 4.0–10.5)

## 2014-03-14 LAB — COMPREHENSIVE METABOLIC PANEL
ALK PHOS: 56 U/L (ref 39–117)
ANION GAP: 13 (ref 5–15)
AST: 19 U/L (ref 0–37)
Albumin: 3.7 g/dL (ref 3.5–5.2)
BUN: 15 mg/dL (ref 6–23)
CALCIUM: 9.3 mg/dL (ref 8.4–10.5)
CO2: 23 mEq/L (ref 19–32)
Chloride: 102 mEq/L (ref 96–112)
Creatinine, Ser: 1.01 mg/dL (ref 0.50–1.35)
GFR calc non Af Amer: 77 mL/min — ABNORMAL LOW (ref >90)
GFR, EST AFRICAN AMERICAN: 89 mL/min — AB (ref >90)
GLUCOSE: 85 mg/dL (ref 70–99)
POTASSIUM: 4.1 meq/L (ref 3.7–5.3)
SODIUM: 138 meq/L (ref 137–147)
TOTAL PROTEIN: 7.3 g/dL (ref 6.0–8.3)
Total Bilirubin: 0.5 mg/dL (ref 0.3–1.2)

## 2014-03-14 LAB — URINALYSIS, ROUTINE W REFLEX MICROSCOPIC
Bilirubin Urine: NEGATIVE
GLUCOSE, UA: NEGATIVE mg/dL
Ketones, ur: NEGATIVE mg/dL
LEUKOCYTES UA: NEGATIVE
Nitrite: NEGATIVE
Protein, ur: NEGATIVE mg/dL
Specific Gravity, Urine: 1.006 (ref 1.005–1.030)
Urobilinogen, UA: 0.2 mg/dL (ref 0.0–1.0)
pH: 7 (ref 5.0–8.0)

## 2014-03-14 LAB — URINE MICROSCOPIC-ADD ON

## 2014-03-14 SURGERY — PARS PLANA VITRECTOMY WITH 25 GAUGE
Anesthesia: General | Site: Eye | Laterality: Left

## 2014-03-14 MED ORDER — FENTANYL CITRATE 0.05 MG/ML IJ SOLN
INTRAMUSCULAR | Status: DC | PRN
  Administered 2014-03-14: 100 ug via INTRAVENOUS
  Administered 2014-03-14: 50 ug via INTRAVENOUS

## 2014-03-14 MED ORDER — HYPROMELLOSE (GONIOSCOPIC) 2.5 % OP SOLN
OPHTHALMIC | Status: DC | PRN
  Administered 2014-03-14: 2 [drp] via OPHTHALMIC

## 2014-03-14 MED ORDER — ROCURONIUM BROMIDE 100 MG/10ML IV SOLN
INTRAVENOUS | Status: DC | PRN
  Administered 2014-03-14: 10 mg via INTRAVENOUS
  Administered 2014-03-14: 50 mg via INTRAVENOUS
  Administered 2014-03-14: 10 mg via INTRAVENOUS

## 2014-03-14 MED ORDER — LIDOCAINE HCL (CARDIAC) 20 MG/ML IV SOLN
INTRAVENOUS | Status: AC
Start: 2014-03-14 — End: 2014-03-14
  Filled 2014-03-14: qty 5

## 2014-03-14 MED ORDER — EPINEPHRINE HCL 1 MG/ML IJ SOLN
INTRAMUSCULAR | Status: DC | PRN
  Administered 2014-03-14: 11:00:00

## 2014-03-14 MED ORDER — TETRACAINE HCL 0.5 % OP SOLN
OPHTHALMIC | Status: DC | PRN
  Administered 2014-03-14: 2 [drp] via OPHTHALMIC

## 2014-03-14 MED ORDER — SODIUM CHLORIDE 0.9 % IV SOLN
INTRAVENOUS | Status: DC | PRN
  Administered 2014-03-14 (×2): via INTRAVENOUS

## 2014-03-14 MED ORDER — LIDOCAINE HCL (CARDIAC) 20 MG/ML IV SOLN
INTRAVENOUS | Status: AC
  Filled 2014-03-14: qty 5

## 2014-03-14 MED ORDER — DEXAMETHASONE SODIUM PHOSPHATE 4 MG/ML IJ SOLN
INTRAMUSCULAR | Status: DC | PRN
Start: 2014-03-14 — End: 2014-03-14
  Administered 2014-03-14: 4 mg via INTRAVENOUS

## 2014-03-14 MED ORDER — PROPOFOL 10 MG/ML IV BOLUS
INTRAVENOUS | Status: DC | PRN
  Administered 2014-03-14: 200 mg via INTRAVENOUS

## 2014-03-14 MED ORDER — GLYCOPYRROLATE 0.2 MG/ML IJ SOLN
INTRAMUSCULAR | Status: DC | PRN
  Administered 2014-03-14: 0.6 mg via INTRAVENOUS
  Administered 2014-03-14 (×2): 0.2 mg via INTRAVENOUS

## 2014-03-14 MED ORDER — SODIUM CHLORIDE 0.9 % IV SOLN
INTRAVENOUS | Status: DC
  Administered 2014-03-14: 10:00:00 via INTRAVENOUS

## 2014-03-14 MED ORDER — MIDAZOLAM HCL 2 MG/2ML IJ SOLN
INTRAMUSCULAR | Status: AC
  Filled 2014-03-14: qty 2

## 2014-03-14 MED ORDER — LIDOCAINE HCL 4 % MT SOLN
OROMUCOSAL | Status: DC | PRN
  Administered 2014-03-14: 4 mL via TOPICAL

## 2014-03-14 MED ORDER — SODIUM CHLORIDE 0.9 % IJ SOLN
INTRAMUSCULAR | Status: AC
  Filled 2014-03-14: qty 10

## 2014-03-14 MED ORDER — LIDOCAINE HCL (CARDIAC) 20 MG/ML IV SOLN
INTRAVENOUS | Status: DC | PRN
  Administered 2014-03-14: 100 mg via INTRAVENOUS

## 2014-03-14 MED ORDER — OFLOXACIN 0.3 % OP SOLN
OPHTHALMIC | Status: AC
  Filled 2014-03-14: qty 5

## 2014-03-14 MED ORDER — PHENYLEPHRINE HCL 2.5 % OP SOLN
OPHTHALMIC | Status: AC
  Filled 2014-03-14: qty 2

## 2014-03-14 MED ORDER — STERILE WATER FOR INJECTION IJ SOLN
INTRAMUSCULAR | Status: DC | PRN
  Administered 2014-03-14: 200 mL

## 2014-03-14 MED ORDER — ROCURONIUM BROMIDE 50 MG/5ML IV SOLN
INTRAVENOUS | Status: AC
  Filled 2014-03-14: qty 1

## 2014-03-14 MED ORDER — CYCLOPENTOLATE HCL 1 % OP SOLN
OPHTHALMIC | Status: AC
  Filled 2014-03-14: qty 2

## 2014-03-14 MED ORDER — PHENYLEPHRINE 40 MCG/ML (10ML) SYRINGE FOR IV PUSH (FOR BLOOD PRESSURE SUPPORT)
PREFILLED_SYRINGE | INTRAVENOUS | Status: AC
  Filled 2014-03-14: qty 10

## 2014-03-14 MED ORDER — OXYCODONE HCL 5 MG PO TABS: 5.0000 mg | ORAL_TABLET | Freq: Once | ORAL | Status: DC | PRN

## 2014-03-14 MED ORDER — DEXAMETHASONE SODIUM PHOSPHATE 10 MG/ML IJ SOLN
INTRAMUSCULAR | Status: DC | PRN
  Administered 2014-03-14: 10 mg

## 2014-03-14 MED ORDER — INDOCYANINE GREEN 25 MG IV SOLR
5.0000 mg | Freq: Once | INTRAVENOUS | Status: DC
  Filled 2014-03-14: qty 25

## 2014-03-14 MED ORDER — FENTANYL CITRATE 0.05 MG/ML IJ SOLN
INTRAMUSCULAR | Status: AC
  Filled 2014-03-14: qty 5

## 2014-03-14 MED ORDER — EPHEDRINE SULFATE 50 MG/ML IJ SOLN
INTRAMUSCULAR | Status: DC | PRN
  Administered 2014-03-14: 10 mg via INTRAVENOUS

## 2014-03-14 MED ORDER — NEOSTIGMINE METHYLSULFATE 10 MG/10ML IV SOLN
INTRAVENOUS | Status: DC | PRN
  Administered 2014-03-14: 4 mg via INTRAVENOUS

## 2014-03-14 MED ORDER — PHENYLEPHRINE HCL 10 MG/ML IJ SOLN
10.0000 mg | INTRAVENOUS | Status: DC | PRN
  Administered 2014-03-14: 20 ug/min via INTRAVENOUS

## 2014-03-14 MED ORDER — ONDANSETRON HCL 4 MG/2ML IJ SOLN: 4.0000 mg | Freq: Once | INTRAMUSCULAR | Status: DC | PRN

## 2014-03-14 MED ORDER — STERILE WATER FOR INJECTION IJ SOLN
INTRAMUSCULAR | Status: AC
  Filled 2014-03-14: qty 10

## 2014-03-14 MED ORDER — EPHEDRINE SULFATE 50 MG/ML IJ SOLN
INTRAMUSCULAR | Status: AC
  Filled 2014-03-14: qty 1

## 2014-03-14 MED ORDER — HYDROMORPHONE HCL PF 1 MG/ML IJ SOLN: 0.2500 mg | INTRAMUSCULAR | Status: DC | PRN

## 2014-03-14 MED ORDER — ONDANSETRON HCL 4 MG/2ML IJ SOLN
INTRAMUSCULAR | Status: DC | PRN
  Administered 2014-03-14: 4 mg via INTRAVENOUS

## 2014-03-14 MED ORDER — MIDAZOLAM HCL 5 MG/5ML IJ SOLN
INTRAMUSCULAR | Status: DC | PRN
  Administered 2014-03-14: 2 mg via INTRAVENOUS

## 2014-03-14 MED ORDER — 0.9 % SODIUM CHLORIDE (POUR BTL) OPTIME
TOPICAL | Status: DC | PRN
  Administered 2014-03-14: 200 mL

## 2014-03-14 MED ORDER — OXYCODONE HCL 5 MG/5ML PO SOLN: 5.0000 mg | Freq: Once | ORAL | Status: DC | PRN

## 2014-03-14 MED ORDER — PROPOFOL 10 MG/ML IV BOLUS
INTRAVENOUS | Status: AC
  Filled 2014-03-14: qty 20

## 2014-03-14 SURGICAL SUPPLY — 60 items
APPLICATOR COTTON TIP 6IN STRL (MISCELLANEOUS) ×2 IMPLANT
APPLICATOR DR MATTHEWS STRL (MISCELLANEOUS) IMPLANT
BLADE 10 SAFETY STRL DISP (BLADE) IMPLANT
BLADE MVR KNIFE 20G (BLADE) IMPLANT
CANNULA ANT CHAM MAIN (OPHTHALMIC RELATED) IMPLANT
CANNULA VLV SOFT TIP 25GA (OPHTHALMIC) ×2 IMPLANT
CORDS BIPOLAR (ELECTRODE) IMPLANT
COVER MAYO STAND STRL (DRAPES) ×2 IMPLANT
DRAPE INCISE 51X51 W/FILM STRL (DRAPES) IMPLANT
DRAPE OPHTHALMIC 77X100 STRL (CUSTOM PROCEDURE TRAY) ×2 IMPLANT
FILTER BLUE MILLIPORE (MISCELLANEOUS) IMPLANT
FORCEPS ECKARDT ILM 25G SERR (OPHTHALMIC RELATED) IMPLANT
FORCEPS GRIESHABER ILM 25G A (INSTRUMENTS) ×2 IMPLANT
FORCEPS HORIZONTAL 25G DISP (OPHTHALMIC RELATED) IMPLANT
FORCEPS ILM 25G DSP TIP (MISCELLANEOUS) ×2 IMPLANT
GAS AUTO FILL CONSTEL (OPHTHALMIC)
GAS AUTO FILL CONSTELLATION (OPHTHALMIC) IMPLANT
GLOVE BIOGEL PI IND STRL 7.0 (GLOVE) ×1 IMPLANT
GLOVE BIOGEL PI INDICATOR 7.0 (GLOVE) ×1
GLOVE SS BIOGEL STRL SZ 8.5 (GLOVE) ×1 IMPLANT
GLOVE SUPERSENSE BIOGEL SZ 8.5 (GLOVE) ×1
GLOVE SURG SS PI 7.0 STRL IVOR (GLOVE) ×4 IMPLANT
GOWN STRL REUS W/ TWL LRG LVL3 (GOWN DISPOSABLE) ×1 IMPLANT
GOWN STRL REUS W/ TWL XL LVL3 (GOWN DISPOSABLE) ×1 IMPLANT
GOWN STRL REUS W/TWL LRG LVL3 (GOWN DISPOSABLE) ×1
GOWN STRL REUS W/TWL XL LVL3 (GOWN DISPOSABLE) ×1
HANDLE PNEUMATIC FOR CONSTEL (OPHTHALMIC) IMPLANT
KIT BASIN OR (CUSTOM PROCEDURE TRAY) ×2 IMPLANT
KIT ROOM TURNOVER OR (KITS) ×2 IMPLANT
KNIFE CRESCENT 2.5 55 ANG (BLADE) IMPLANT
LENS BIOM SUPER VIEW SET DISP (OPHTHALMIC RELATED) ×2 IMPLANT
MICROPICK 25G (MISCELLANEOUS)
NEEDLE 18GX1X1/2 (RX/OR ONLY) (NEEDLE) IMPLANT
NEEDLE 25GX 5/8IN NON SAFETY (NEEDLE) ×2 IMPLANT
NEEDLE FILTER BLUNT 18X 1/2SAF (NEEDLE) ×1
NEEDLE FILTER BLUNT 18X1 1/2 (NEEDLE) ×1 IMPLANT
NEEDLE HYPO 25GX1X1/2 BEV (NEEDLE) IMPLANT
NS IRRIG 1000ML POUR BTL (IV SOLUTION) ×2 IMPLANT
PACK FRAGMATOME (OPHTHALMIC) IMPLANT
PACK VITRECTOMY CUSTOM (CUSTOM PROCEDURE TRAY) ×2 IMPLANT
PAD ARMBOARD 7.5X6 YLW CONV (MISCELLANEOUS) ×2 IMPLANT
PAK PIK VITRECTOMY CVS 25GA (OPHTHALMIC) ×2 IMPLANT
PENCIL BIPOLAR 25GA STR DISP (OPHTHALMIC RELATED) IMPLANT
PICK MICROPICK 25G (MISCELLANEOUS) IMPLANT
PROBE LASER ILLUM FLEX CVD 25G (OPHTHALMIC) ×2 IMPLANT
ROLLS DENTAL (MISCELLANEOUS) ×4 IMPLANT
SCRAPER DIAMOND 25GA (OPHTHALMIC RELATED) IMPLANT
SET INJECTOR OIL FLUID CONSTEL (OPHTHALMIC) IMPLANT
STOCKINETTE IMPERVIOUS 9X36 MD (GAUZE/BANDAGES/DRESSINGS) ×4 IMPLANT
STOPCOCK 4 WAY LG BORE MALE ST (IV SETS) IMPLANT
SUT ETHILON 10 0 CS140 6 (SUTURE) IMPLANT
SUT ETHILON 8 0 BV130 4 (SUTURE) IMPLANT
SUT MERSILENE 5 0 RD 1 DA (SUTURE) IMPLANT
SUT PROLENE 10 0 CIF 4 DA (SUTURE) IMPLANT
SYR 30ML SLIP (SYRINGE) ×2 IMPLANT
SYR 5ML LL (SYRINGE) ×2 IMPLANT
SYR TB 1ML LUER SLIP (SYRINGE) ×2 IMPLANT
TOWEL OR 17X26 10 PK STRL BLUE (TOWEL DISPOSABLE) ×2 IMPLANT
WATER STERILE IRR 1000ML POUR (IV SOLUTION) ×2 IMPLANT
WIPE INSTRUMENT VISIWIPE 73X73 (MISCELLANEOUS) ×2 IMPLANT

## 2014-03-14 NOTE — Progress Notes (Signed)
Spoke with Anderson Malta at Dr Rankin's office and informed her of no eye drop orders.

## 2014-03-14 NOTE — Progress Notes (Signed)
Pt up at bedside with assistance trying to void unsuccessful assisted to br to void pt did void in toilet

## 2014-03-14 NOTE — ED Notes (Signed)
Pt given foley cath instructions, pt/family verbalize and demonstrate understanding.

## 2014-03-14 NOTE — ED Notes (Signed)
Initial Contact - pt A+OX4, reports unable to void fully after eye surgery this AM.  Pt reports feeling pressure in his bladder and feeling the need to void every 3-4 minutes but only able to dribble.  Bladder scan = 950.  Dr. Doy Mince aware.  Pt denies other complaints.  Skin PWD.  MAEI, ambulatory with steady gait.  Speaking full/clear sentences, rr even/un-lab.  NAD.

## 2014-03-14 NOTE — Transfer of Care (Signed)
Immediate Anesthesia Transfer of Care Note  Patient: [name]  Procedure(s) Performed: Procedure(s): PARS PLANA VITRECTOMY WITH 25 GAUGE (Left) MEMBRANE PEEL; ENDOLASER (Left)  Patient Location: PACU  Anesthesia Type:General  Level of Consciousness: awake, alert  and oriented  Airway & Oxygen Therapy: Patient Spontanous Breathing and Patient connected to nasal cannula oxygen  Post-op Assessment: Report given to PACU RN, Post -op Vital signs reviewed and stable and Patient moving all extremities  Post vital signs: Reviewed and stable  Complications: No apparent anesthesia complications

## 2014-03-14 NOTE — Discharge Instructions (Signed)
Acute Urinary Retention °Acute urinary retention is the temporary inability to urinate. °This is a common problem in older men. As men age their prostates become larger and block the flow of urine from the bladder. This is usually a problem that has come on gradually.  °HOME CARE INSTRUCTIONS °If you are sent home with a Foley catheter and a drainage system, you will need to discuss the best course of action with your health care provider. While the catheter is in, maintain a good intake of fluids. Keep the drainage bag emptied and lower than your catheter. This is so that contaminated urine will not flow back into your bladder, which could lead to a urinary tract infection. °There are two main types of drainage bags. One is a large bag that usually is used at night. It has a good capacity that will allow you to sleep through the night without having to empty it. The second type is called a leg bag. It has a smaller capacity, so it needs to be emptied more frequently. However, the main advantage is that it can be attached by a leg strap and can go underneath your clothing, allowing you the freedom to move about or leave your home. °Only take over-the-counter or prescription medicines for pain, discomfort, or fever as directed by your health care provider.  °SEEK MEDICAL CARE IF: °· You develop a low-grade fever. °· You experience spasms or leakage of urine with the spasms. °SEEK IMMEDIATE MEDICAL CARE IF:  °· You develop chills or fever. °· Your catheter stops draining urine. °· Your catheter falls out. °· You start to develop increased bleeding that does not respond to rest and increased fluid intake. °MAKE SURE YOU: °· Understand these instructions. °· Will watch your condition. °· Will get help right away if you are not doing well or get worse. °Document Released: 10/19/2000 Document Revised: 07/18/2013 Document Reviewed: 12/22/2012 °[employer]® Patient Information ©2015 [employer]. This information is not  intended to replace advice given to you by your health care provider. Make sure you discuss any questions you have with your health care provider. ° °

## 2014-03-14 NOTE — H&P (Signed)
[name] is an 63 y.o. male.   Chief Complaint: Vision loss left eye with compromise of activities of daily living. HPI: Progressive vision loss the left eye and the basis of epiretinal membrane with macular topographic distortion.  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  Past Medical History  Diagnosis Date  . Hypertension   . Allergy   . PVD (peripheral vascular disease)   . Diverticulosis   . Hepatitis C     chronic  . HLD (hyperlipidemia)     statin intolerant (Crestor & Simvastatin)  . Plantar fasciitis     right  . Head injury, closed, with concussion   . Ulcer     Past Surgical History  Procedure Laterality Date  . Tonsillectomy    . Cardiac catheterization  2005    30% Cx.   [name] Nm myoview ltd  2011    Neg Ischemia or infarct.  . Transthoracic echocardiogram  2013    Normal EF. No significant Valve Disease  . Colonoscopy    . Upper gastrointestinal endoscopy      Family History  Problem Relation Age of Onset  . Heart disease Mother   . Cancer Sister    Social History:  reports that he quit smoking about 25 years ago. His smoking use included Cigars. He has never used smokeless tobacco. He reports that he drinks alcohol. He reports that he does not use illicit drugs.  Allergies: No Known Allergies  Medications Prior to Admission  Medication Sig Dispense Refill  . aspirin EC 81 MG tablet Take 81 mg by mouth daily.      . calcium carbonate (OS-CAL) 600 MG TABS tablet Take 600 mg by mouth daily with breakfast.      . carbidopa-levodopa (SINEMET IR) 25-100 MG per tablet Take 0.5 tablets by mouth 3 (three) times daily.       . celecoxib (CELEBREX) 200 MG capsule Take 200 mg by mouth 2 (two) times daily as needed for mild pain.       . hydrochlorothiazide (MICROZIDE) 12.5 MG capsule Take 12.5 mg by mouth daily.      . Ledipasvir-Sofosbuvir (HARVONI) 90-400 MG TABS Take 1 tablet by mouth daily.       [name]  lisinopril (PRINIVIL,ZESTRIL) 20 MG tablet Take 40 mg by mouth daily.      [name] ofloxacin (OCUFLOX) 0.3 % ophthalmic solution Place 1 drop into the left eye 4 (four) times daily.      . pantoprazole (PROTONIX) 40 MG tablet Take 40 mg by mouth daily as needed (for acid reflux).       . Pitavastatin Calcium (LIVALO) 2 MG TABS Take 1 mg by mouth every Monday.      . prednisoLONE acetate (PRED FORTE) 1 % ophthalmic suspension Place 1 drop into the left eye 4 (four) times daily.      . verapamil (CALAN-SR) 240 MG CR tablet Take 240 mg by mouth every morning.      . Multiple Vitamin (MULTIVITAMIN WITH MINERALS) TABS tablet Take 1 tablet by mouth daily.        Results for orders placed during the hospital encounter of 03/14/14 (from the past 48 hour(s))  COMPREHENSIVE METABOLIC PANEL     Status: Abnormal   Collection Time    03/14/14  9:38 AM      Result Value Ref Range   Sodium 138  137 - 147 mEq/L   Potassium 4.1  3.7 - 5.3 mEq/L   Chloride 102  96 - 112 mEq/L   CO2 23  19 - 32 mEq/L   Glucose, Bld 85  70 - 99 mg/dL   BUN 15  6 - 23 mg/dL   Creatinine, Ser 1.01  0.50 - 1.35 mg/dL   Calcium 9.3  8.4 - 10.5 mg/dL   Total Protein 7.3  6.0 - 8.3 g/dL   Albumin 3.7  3.5 - 5.2 g/dL   AST 19  0 - 37 U/L   ALT <5  0 - 53 U/L   Alkaline Phosphatase 56  39 - 117 U/L   Total Bilirubin 0.5  0.3 - 1.2 mg/dL   GFR calc non Af Amer 77 (*) >90 mL/min   GFR calc Af Amer 89 (*) >90 mL/min   Comment: (NOTE)     The eGFR has been calculated using the CKD EPI equation.     This calculation has not been validated in all clinical situations.     eGFR's persistently <90 mL/min signify possible Chronic Kidney     Disease.   Anion gap 13  5 - 15  CBC     Status: Abnormal   Collection Time    03/14/14  9:38 AM      Result Value Ref Range   WBC 3.8 (*) 4.0 - 10.5 K/uL   RBC 5.00  4.22 - 5.81 MIL/uL   Hemoglobin 15.6  13.0 - 17.0 g/dL   HCT 44.6  39.0 - 52.0 %   MCV 89.2  78.0 - 100.0 fL   MCH 31.2  26.0 -  34.0 pg   MCHC 35.0  30.0 - 36.0 g/dL   RDW 12.2  11.5 - 15.5 %   Platelets 234  150 - 400 K/uL   Dg Chest 2 View  03/14/2014   CLINICAL DATA:  Preoperative study prior to vitrectomy  EXAM: CHEST  2 VIEW  COMPARISON:  PA and lateral chest x-ray of May 21, 2008.  FINDINGS: The lungs are hyperinflated with hemidiaphragm flattening. There is no focal infiltrate. The heart and pulmonary vascularity are within the limits of normal. The mediastinum is normal in width. There is no pleural effusion or pneumothorax. The bony thorax is unremarkable.  IMPRESSION: There is mild hyperinflation which may be voluntary or reflect underlying COPD. There is no acute cardiopulmonary abnormality.   Electronically Signed   By: David  Martinique   On: 03/14/2014 09:49    Review of Systems  Constitutional: Negative.   HENT: Negative.   Eyes: Positive for blurred vision.  Respiratory: Negative.   Cardiovascular: Negative.   Gastrointestinal: Negative.   Skin: Negative.   Neurological: Negative.     Pulse 57, temperature 97.2 F (36.2 C), temperature source Oral, resp. rate 18, height '5\' 11"'  (1.803 m), weight 83.915 kg (185 lb), SpO2 100.00%. Physical Exam  Constitutional: He is oriented to person, place, and time. He appears well-developed and well-nourished.  Eyes: Conjunctivae and EOM are normal.    Vision left eye is 20/50 with severe topographic the distortion to the foveal region on the basis of epiretinal membrane patient currently has profound visual loss impairment of activities of daily living, including reading, driving, ability to function at work    Respiratory: Effort normal.  Musculoskeletal: Normal range of motion.  Neurological: He is alert and oriented to person, place, and time. He has normal reflexes.  Skin: Skin is warm, dry and intact.  Psychiatric: He has a normal mood and affect.  His speech is normal and behavior is normal. Judgment and thought content normal. Cognition and memory  are normal.     Assessment/Plan 1. Epiretinal membrane with vision loss left eye 2. 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 History of steroid dependent glaucoma right eye.  Plan 1. Vitrectomy with membrane peel left eye. Prophylactic endolaser retinopexy of the left eye. This will be performed under general anesthesia as the patient is monocular, quite nervous, and noted to have some ambulatory vision after surgery  Ira Dougher A 03/14/2014, 11:19 AM

## 2014-03-14 NOTE — Anesthesia Procedure Notes (Signed)
Procedure Name: Intubation Date/Time: 03/14/2014 11:50 AM Performed by: Trixie Deis A Pre-anesthesia Checklist: Patient identified, Emergency Drugs available, Suction available, Patient being monitored and Timeout performed Patient Re-evaluated:Patient Re-evaluated prior to inductionOxygen Delivery Method: Circle system utilized Preoxygenation: Pre-oxygenation with 100% oxygen Intubation Type: IV induction Ventilation: Mask ventilation without difficulty Laryngoscope Size: Mac and 4 Grade View: Grade I Tube type: Oral Tube size: 8.0 mm Number of attempts: 1 Airway Equipment and Method: Stylet Placement Confirmation: ETT inserted through vocal cords under direct vision,  positive ETCO2 and breath sounds checked- equal and bilateral Secured at: 23 cm Tube secured with: Tape Dental Injury: Teeth and Oropharynx as per pre-operative assessment

## 2014-03-14 NOTE — Discharge Instructions (Signed)

## 2014-03-14 NOTE — ED Provider Notes (Signed)
CSN: [id number]     Arrival date & time 03/14/14  1944 History   First MD Initiated Contact with Patient 03/14/14 2038     Chief Complaint  Patient presents with  . Urinary Retention     (Consider location/radiation/quality/duration/timing/severity/associated sxs/prior Treatment) HPI Comments: 63 yo male who had eye surgery this morning who presents with urinary retention.  Has not urinated all day.  Has moderate, constant, low abdominal pressure and the frequent urge to urinate.  Only able to produce a scant amount of urine when he tries.    Patient is a 63 y.o. male presenting with male genitourinary complaint.  Male GU Problem Presenting symptoms comment:  Urinary retention Context comment:  After anesthesia for eye surgery earlier today Relieved by:  Nothing Worsened by:  Nothing tried Associated symptoms: abdominal pain (fullness, urge to urinate.)   Associated symptoms: no fever, no flank pain, no nausea and no vomiting     Past Medical History  Diagnosis Date  . Hypertension   . Allergy   . PVD (peripheral vascular disease)   . Diverticulosis   . Hepatitis C     chronic  . HLD (hyperlipidemia)     statin intolerant (Crestor & Simvastatin)  . Plantar fasciitis     right  . Head injury, closed, with concussion   . Ulcer    Past Surgical History  Procedure Laterality Date  . Tonsillectomy    . Cardiac catheterization  2005    30% Cx.   [name] Nm myoview ltd  2011    Neg Ischemia or infarct.  . Transthoracic echocardiogram  2013    Normal EF. No significant Valve Disease  . Colonoscopy    . Upper gastrointestinal endoscopy    . Eye surgery     Family History  Problem Relation Age of Onset  . Heart disease Mother   . Cancer Sister    History  Substance Use Topics  . Smoking status: Former Smoker    Types: Cigars    Quit date: 07/27/1988  . Smokeless tobacco: Never Used  . Alcohol Use: Yes     Comment: occasional    Review of Systems  Constitutional:  Negative for fever.  Gastrointestinal: Positive for abdominal pain (fullness, urge to urinate.). Negative for nausea and vomiting.  Genitourinary: Negative for flank pain.  All other systems reviewed and are negative.     Allergies  Review of patient's allergies indicates no known allergies.  Home Medications   Prior to Admission medications   Medication Sig Start Date End Date Taking? Authorizing Provider  aspirin EC 81 MG tablet Take 81 mg by mouth daily.   Yes Historical Provider, MD  calcium carbonate (OS-CAL) 600 MG TABS tablet Take 600 mg by mouth daily with breakfast.   Yes Historical Provider, MD  carbidopa-levodopa (SINEMET IR) 25-100 MG per tablet Take 0.5 tablets by mouth 3 (three) times daily.    Yes Historical Provider, MD  celecoxib (CELEBREX) 200 MG capsule Take 200 mg by mouth 2 (two) times daily as needed for mild pain.  06/05/13  Yes Denita Lung, MD  hydrochlorothiazide (MICROZIDE) 12.5 MG capsule Take 12.5 mg by mouth daily.   Yes Historical Provider, MD  Ledipasvir-Sofosbuvir (HARVONI) 90-400 MG TABS Take 1 tablet by mouth daily.    Yes Historical Provider, MD  lisinopril (PRINIVIL,ZESTRIL) 20 MG tablet Take 40 mg by mouth daily.   Yes Historical Provider, MD  Multiple Vitamin (MULTIVITAMIN WITH MINERALS) TABS tablet Take 1 tablet by  mouth daily.   Yes Historical Provider, MD  ofloxacin (OCUFLOX) 0.3 % ophthalmic solution Place 1 drop into the left eye 4 (four) times daily.   Yes Historical Provider, MD  pantoprazole (PROTONIX) 40 MG tablet Take 40 mg by mouth daily as needed (for acid reflux).    Yes Historical Provider, MD  Pitavastatin Calcium (LIVALO) 2 MG TABS Take 1 mg by mouth every Monday.   Yes Historical Provider, MD  prednisoLONE acetate (PRED FORTE) 1 % ophthalmic suspension Place 1 drop into the left eye 4 (four) times daily.   Yes Historical Provider, MD  verapamil (CALAN-SR) 240 MG CR tablet Take 240 mg by mouth every morning.   Yes Historical  Provider, MD   BP 144/96  Pulse 76  Resp 18  SpO2 98% Physical Exam  Nursing note and vitals reviewed. Constitutional: He is oriented to person, place, and time. He appears well-developed and well-nourished. No distress.  HENT:  Head: Normocephalic and atraumatic.  Eyes: Conjunctivae are normal. No scleral icterus.  Neck: Neck supple.  Cardiovascular: Normal rate and intact distal pulses.   Pulmonary/Chest: Effort normal. No stridor. No respiratory distress.  Abdominal: Normal appearance. He exhibits no distension. There is no rigidity, no rebound and no guarding.  Palpable distended bladder in lower abdomen with mild associated tenderness to palpation.    Neurological: He is alert and oriented to person, place, and time.  Skin: Skin is warm and dry. No rash noted.  Psychiatric: He has a normal mood and affect. His behavior is normal.    ED Course  Procedures (including critical care time) Labs Review Labs Reviewed  URINALYSIS, ROUTINE W REFLEX MICROSCOPIC - Abnormal; Notable for the following:    APPearance CLOUDY (*)    Hgb urine dipstick SMALL (*)    All other components within normal limits  URINE MICROSCOPIC-ADD ON    Imaging Review Dg Chest 2 View  03/14/2014   CLINICAL DATA:  Preoperative study prior to vitrectomy  EXAM: CHEST  2 VIEW  COMPARISON:  PA and lateral chest x-ray of May 21, 2008.  FINDINGS: The lungs are hyperinflated with hemidiaphragm flattening. There is no focal infiltrate. The heart and pulmonary vascularity are within the limits of normal. The mediastinum is normal in width. There is no pleural effusion or pneumothorax. The bony thorax is unremarkable.  IMPRESSION: There is mild hyperinflation which may be voluntary or reflect underlying COPD. There is no acute cardiopulmonary abnormality.   Electronically Signed   By: David  Martinique   On: 03/14/2014 09:49     EKG Interpretation None      MDM   Final diagnoses:  Urinary retention    63 yo  male with post anesthesia urinary retention.  Foley placed.  He has a urologist (Dr. Janice Norrie) who he will followup with.      Arbie Cookey, MD 03/14/14 2218

## 2014-03-14 NOTE — Anesthesia Postprocedure Evaluation (Signed)
  Anesthesia Post-op Note  Patient: [name]  Procedure(s) Performed: Procedure(s): PARS PLANA VITRECTOMY WITH 25 GAUGE (Left) MEMBRANE PEEL; ENDOLASER (Left)  Patient Location: PACU  Anesthesia Type: General   Level of Consciousness: awake, alert  and oriented  Airway and Oxygen Therapy: Patient Spontanous Breathing  Post-op Pain: mild  Post-op Assessment: Post-op Vital signs reviewed  Post-op Vital Signs: Reviewed  Last Vitals:  Filed Vitals:   03/14/14 1345  BP: 159/98  Pulse: 72  Temp:   Resp: 16    Complications: No apparent anesthesia complications

## 2014-03-14 NOTE — Brief Op Note (Signed)
03/14/2014  12:56 PM  PATIENT:  [name]  63 y.o. male  PRE-OPERATIVE DIAGNOSIS:  Macular puckering of retina, left [362.56],,,  POST-OPERATIVE DIAGNOSIS:  1. Same 2.  Branch retinal vein occlusion, and artery occlusion left eye.  3. History of giant retinal tear and detachment right eye.  PROCEDURE:  Procedure(s): PARS PLANA VITRECTOMY WITH 25 GAUGE (Left) MEMBRANE PEEL (Left),, with focal endolaser retinopexy , and ablation ischemic retina inferotemporally  SURGEON:  Surgeon(s) and Role:    * Hurman Horn, MD - Primary  PHYSICIAN ASSISTANT:   ASSISTANTS: none   ANESTHESIA:   general  EBL:  Total I/O In: 500 [I.V.:500] Out: -   BLOOD ADMINISTERED:none  DRAINS: none   LOCAL MEDICATIONS USED:  NONE  SPECIMEN:  No Specimen  DISPOSITION OF SPECIMEN:  N/A  COUNTS:  YES  TOURNIQUET:  * No tourniquets in log *  DICTATION: .Other Dictation: Dictation Number [id number]  PLAN OF CARE: Discharge to home after PACU  PATIENT DISPOSITION:  PACU - hemodynamically stable.   Delay start of Pharmacological VTE agent (>24hrs) due to surgical blood loss or risk of bleeding: yes

## 2014-03-14 NOTE — OR Nursing (Signed)
Addendum note:    3785 Time out was performed prior to Dr. Zadie Rhine instilling patient eye drops in LEFT eye.          Eye drops used: Phenylephrine Hydrochloride 2.5% (exxp. 03/17)  Given one drop at 1200; 1202; 1210; 1212;  1218; 1221  two drops were instilled.                                      Cyclopentolate Hydrochloride 1% (exp. 01/17)  Given one drop at 1200; 1202; 1210 1212; 1218; 1221 one drop instilled.                                      Ofloxacin 0.3% (exp. 03/17)   Given one drop at 1202; 1205

## 2014-03-14 NOTE — OR Nursing (Signed)
Addendum note:1215:  ICG mixture for membrane peel. 25 mg vial of Indocyanine green powder, 10 ml of sterile water and 33ml bag of D5W obtained for procedure and was delivered to the sterile field. . ICG powder was reconstituted with 2ml sterile water by Liliane Channel, RN.  Derenda Mis, RN (relief scrub),  drew up 1 ml of mixture and added it to 4.5 ml of D5W  per Dr. Dahlia Bailiff instructions.

## 2014-03-14 NOTE — Telephone Encounter (Signed)
Left message for patient to return call and schedule 3 months follow up with LL/Sethi.

## 2014-03-14 NOTE — Anesthesia Preprocedure Evaluation (Addendum)
Anesthesia Evaluation  Patient identified by MRN, date of birth, ID band Patient awake    Reviewed: Allergy & Precautions, H&P , NPO status , Patient's Chart, lab work & pertinent test results  History of Anesthesia Complications Negative for: history of anesthetic complications  Airway Mallampati: I TM Distance: >3 FB Neck ROM: Full    Dental  (+) Partial Lower, Partial Upper, Dental Advisory Given   Pulmonary former smoker,  breath sounds clear to auscultation  Pulmonary exam normal       Cardiovascular Exercise Tolerance: Good hypertension, Pt. on medications + Peripheral Vascular Disease Rhythm:Regular Rate:Normal     Neuro/Psych    GI/Hepatic (+) Hepatitis -, C  Endo/Other    Renal/GU      Musculoskeletal   Abdominal   Peds  Hematology   Anesthesia Other Findings   Reproductive/Obstetrics                        Anesthesia Physical Anesthesia Plan  ASA: III  Anesthesia Plan: General   Post-op Pain Management:    Induction: Intravenous  Airway Management Planned: Oral ETT  Additional Equipment:   Intra-op Plan:   Post-operative Plan: Extubation in OR  Informed Consent: I have reviewed the patients History and Physical, chart, labs and discussed the procedure including the risks, benefits and alternatives for the proposed anesthesia with the patient or authorized representative who has indicated his/her understanding and acceptance.   Dental advisory given  Plan Discussed with: CRNA, Anesthesiologist and Surgeon  Anesthesia Plan Comments:        Anesthesia Quick Evaluation

## 2014-03-14 NOTE — ED Notes (Signed)
Pt states he had eye sx today and since he got home has been unable to urinate. Pt states he feel the need to to urinate but cannot get any flow. Spoke with his surgeon who suggested he some to the ED for evaluation.

## 2014-03-15 ENCOUNTER — Encounter (HOSPITAL_COMMUNITY): Payer: Self-pay | Admitting: Ophthalmology

## 2014-03-15 NOTE — Op Note (Deleted)
NAMEMarland Kitchen  JOSSUE, RUBENSTEIN NO.:  [id number]  MEDICAL RECORD NO.:  [id number]  LOCATION:                               FACILITY:  [facility]  PHYSICIAN:  Dominica Severin A. Pola Furno, M.D.   DATE OF BIRTH:  [birth year]  DATE OF PROCEDURE:  03/14/2014 DATE OF DISCHARGE:  03/14/2014                              OPERATIVE REPORT   PREOPERATIVE DIAGNOSIS:  Epiretinal membrane with macular pucker, left eye.  POSTOPERATIVE DIAGNOSES: 1. Epiretinal membrane with macular pucker, left eye. 2. 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 3. 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  PROCEDURES: 1. Posterior vitrectomy with membrane peel, severe macular puckering     of epiretinal membrane and internal limiting membrane, left eye. 2. Focal ablation of ischemic retina as well as endolaser retinopexy     of the left eye, 360 degrees.  SURGEON:  Clent Demark. Kemoni Quesenberry, M.D.  FINDINGS:  Ischemic retina along the inferotemporal arcade and out peripherally which could be contributing to the ongoing macular edema, treated in the past with Lucentis, and other intravitreal medications.  The patient __________ to release the __________ topographic distortion, and visual acuity demise in the left eye on the basis of epiretinal membrane.  He understands the risk of anesthesia, rare occurrence of __________ underlying condition and a surgical __________ including, but not limited to hemorrhage, infection, scarring, need for another surgery, change in vision, loss of vision, progressive disease despite intervention.  Appropriate signed consent was obtained.  DESCRIPTION OF PROCEDURE:  The patient was taken to the operating room. In the operating room, general anesthesia __________ difficulty __________ prepped and  draped in usual ophthalmic fashion.  The patient was not dilated properly and thus, additional dilating drops were placed.  A 25-gauge trocar was placed in the inferotemporal quadrant. Placement verified visually.  Infusion turned on.  Superior trocars were applied.  __________ was then began.  Posterior hyaloid had been spontaneously released and this was verified after core vitrectomy by active suction __________ of the optic nerve and there was no residual posterior hyaloid attached.  Peripheral vitreous was circumcised 360 degrees.  The native lens was then placed, thus I could not be aggressive with anterior hyaloid removal.  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  I elected to place 360 degrees Endolaser retinopexy at this time, so as to minimize risk of retinal tears and detachments of the late onset issue in this condition.  Endolaser photocoagulation placed 360 degrees.  At this time, fluid-air exchange completed.  Further laser photocoagulation placed superiorly.  At this time, under air, diluted solution of ICG was injected with a MAC and this was almost immediately aspirated.  Excellent staining was obtained in the ILM and the epiretinal membrane.  Air-fluid exchange carried out under fluid.  ILM portions were then used to move epiretinal membrane as well as subjacent internal limiting membrane of the  left eye.  No complications occurred.  Improved topographic distortion was noted.  Typical post ILM changes were noted.  Care was taken to avoid macular hole formation, none developed.  Radius of 1 disc diameter of ILM was removed around the fovea.  No complications occurred. Instruments were removed from the eye.  Superior trocar was removed from the eye.  Intraocular pressure assessed and found to be adequate. Infusion was then removed.  Subconjunctival Decadron applied. __________ traction applied.  The patient tolerated the  procedure without complications, taken to the PACU.  The patient had no complications.     Clent Demark Elwyn Lowden, M.D.     GAR/MEDQ  D:  03/14/2014  T:  03/14/2014  Job:  [id number]

## 2014-03-15 NOTE — Op Note (Signed)
NAMEMarland Maxwell  FILMORE, Jesus NO.:  [id number]  MEDICAL RECORD NO.:  [id number]  LOCATION:                               FACILITY:  [facility]  PHYSICIAN:  Dominica Severin A. Sergi Gellner, M.D.   DATE OF BIRTH:  1950-10-29  DATE OF PROCEDURE:  03/14/2014 DATE OF DISCHARGE:  03/14/2014                              OPERATIVE REPORT   PREOPERATIVE DIAGNOSIS:  Epiretinal membrane with macular pucker, left eye.  POSTOPERATIVE DIAGNOSES: 1. Epiretinal membrane with macular pucker, left eye. 2. 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 3. 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  PROCEDURES: 1. Posterior vitrectomy with membrane peel, severe macular puckering     of epiretinal membrane and internal limiting membrane, left eye. 2. Focal ablation of ischemic retina as well as endolaser retinopexy     of the left eye, 360 degrees.  SURGEON:  Clent Demark. Orlandis Sanden, M.D.  FINDINGS:  Ischemic retina along the inferotemporal arcade and out peripherally which could be contributing to the ongoing macular edema, treated in the past with Lucentis, and other intravitreal medications.  The patient __________ to release the __________ topographic distortion, and visual acuity demise in the left eye on the basis of epiretinal membrane.  He understands the risk of anesthesia, rare occurrence of __________ underlying condition and a surgical __________ including, but not limited to hemorrhage, infection, scarring, need for another surgery, change in vision, loss of vision, progressive disease despite intervention.  Appropriate signed consent was obtained.  DESCRIPTION OF PROCEDURE:  The patient was taken to the operating room. In the operating room, general anesthesia __________ difficulty __________ prepped and  draped in usual ophthalmic fashion.  The patient was not dilated properly and thus, additional dilating drops were placed.  A 25-gauge trocar was placed in the inferotemporal quadrant. Placement verified visually.  Infusion turned on.  Superior trocars were applied.  __________ was then began.  Posterior hyaloid had been spontaneously released and this was verified after core vitrectomy by active suction __________ of the optic nerve and there was no residual posterior hyaloid attached.  Peripheral vitreous was circumcised 360 degrees.  The native lens was then placed, thus I could not be aggressive with anterior hyaloid removal.  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  I elected to place 360 degrees Endolaser retinopexy at this time, so as to minimize risk of retinal tears and detachments of the late onset issue in this condition.  Endolaser photocoagulation placed 360 degrees.  At this time, fluid-air exchange completed.  Further laser photocoagulation placed superiorly.  At this time, under air, diluted solution of ICG was injected with a MAC and this was almost immediately aspirated.  Excellent staining was obtained in the ILM and the epiretinal membrane.  Air-fluid exchange carried out under fluid.  ILM portions were then used to move epiretinal membrane as well as subjacent internal limiting membrane of the  left eye.  No complications occurred.  Improved topographic distortion was noted.  Typical post ILM changes were noted.  Care was taken to avoid macular hole formation, none developed.  Radius of 1 disc diameter of ILM was removed around the fovea.  No complications occurred. Instruments were removed from the eye.  Superior trocar was removed from the eye.  Intraocular pressure assessed and found to be adequate. Infusion was then removed.  Subconjunctival Decadron applied. __________ traction applied.  The patient tolerated the  procedure without complications, taken to the PACU.  The patient had no complications.     Clent Demark Lorry Furber, M.D.     GAR/MEDQ  D:  03/14/2014  T:  03/14/2014  Job:  [id number]

## 2014-03-30 ENCOUNTER — Other Ambulatory Visit: Payer: Self-pay

## 2014-03-30 ENCOUNTER — Telehealth: Payer: Self-pay | Admitting: Cardiology

## 2014-03-30 MED ORDER — CARBIDOPA-LEVODOPA 25-100 MG PO TABS: 1.0000 | ORAL_TABLET | Freq: Three times a day (TID) | ORAL | Status: DC

## 2014-03-30 MED ORDER — IRBESARTAN 150 MG PO TABS: 150.0000 mg | ORAL_TABLET | Freq: Every day | ORAL | Status: DC

## 2014-03-30 NOTE — Telephone Encounter (Signed)
Changed to irbesartan 150.  Script sent  Tarri Fuller [facility]

## 2014-03-30 NOTE — Telephone Encounter (Signed)
Patient is experiencing a dry cough with the lisinopril. Please recommend alternative.

## 2014-03-30 NOTE — Telephone Encounter (Signed)
Last OV note says: The patient seems to have shown some improvement on low-dose Sinemet and is tolerating it well without side effects. I recommend he increase it to 1 tablet 3 times daily

## 2014-03-30 NOTE — Telephone Encounter (Signed)
Pt says he is experiencing a dry cough,he thinks it is the Lisinopril. Does he need to come in? Pt wants to be advised as what to do.

## 2014-03-30 NOTE — Telephone Encounter (Signed)
Patient notified lisinopril changed to irbesartan 150 mg. Sent to [facility]. Patient also informed that he may have residual cough for a week or so.

## 2014-04-25 ENCOUNTER — Ambulatory Visit: Payer: 59 | Admitting: Family Medicine

## 2014-04-26 ENCOUNTER — Ambulatory Visit (INDEPENDENT_AMBULATORY_CARE_PROVIDER_SITE_OTHER): Payer: 59 | Admitting: Family Medicine

## 2014-04-26 ENCOUNTER — Telehealth: Payer: Self-pay | Admitting: Family Medicine

## 2014-04-26 ENCOUNTER — Encounter: Payer: Self-pay | Admitting: Family Medicine

## 2014-04-26 VITALS — BP 110/76 | HR 74 | Wt 177.0 lb

## 2014-04-26 DIAGNOSIS — R0789 Other chest pain: Secondary | ICD-10-CM

## 2014-04-26 DIAGNOSIS — K439 Ventral hernia without obstruction or gangrene: Secondary | ICD-10-CM

## 2014-04-26 DIAGNOSIS — Z23 Encounter for immunization: Secondary | ICD-10-CM

## 2014-04-26 DIAGNOSIS — Z8249 Family history of ischemic heart disease and other diseases of the circulatory system: Secondary | ICD-10-CM

## 2014-04-26 DIAGNOSIS — K5900 Constipation, unspecified: Secondary | ICD-10-CM

## 2014-04-26 NOTE — Progress Notes (Signed)
   Subjective:    Patient ID: [name], male    DOB: [birth year], 63 y.o.   MRN: [id number]  HPI Yesterday he had a tingling sensation in his chest with radiation into the left arm but no diaphoresis, SOB or chest pressure. It lasted approximately 5 minutes. He also experienced chest tightness or this occurred at a different time. No shortness of breath, pain, weakness. This lasted 20 minutes. He's had no more difficulty since then. His mother died from cardiac arrest at age 47. He has had previous cardiac evaluation including a catheterization. He also complains of intermittent difficulty with constipation. He also wants a to look at a bump on his abdomen.   Review of Systems     Objective:   Physical Exam Alert and in no distress. Cardiac exam shows regular rhythm without murmurs or gallops. Lungs are clear to auscultation. EKG shows no changes from previous reading. Abdominal exam does show a small ventral hernia superior to the umbilicus of approximately 2 cm in size.       Assessment & Plan:  Family history of heart disease in male family member before age 102  Immunization due  Chest tightness - Plan: EKG 12-Lead  Constipation, unspecified constipation type  Ventral hernia, recurrence not specified  chest symptoms do not seem to be cardiac in origin although must consider this. For constipation you need to have extra fluids, bulk in your diet and exercise as well as listen to your body Pay attention to your symptoms in terms of food, position, activity, stress and see if there is a pattern No therapy needed for the ventral hernia.

## 2014-04-26 NOTE — Patient Instructions (Addendum)
For constipation you need to have extra fluids, bulk in your diet and exercise as well as listen to your body Pay attention to your symptoms in terms of food, position, activity, stress and see if there is a pattern

## 2014-04-26 NOTE — Telephone Encounter (Signed)
Pt called and states he saw you this morning and he said along with the chest tightness he is having a non productive dry cough

## 2014-05-10 ENCOUNTER — Telehealth: Payer: Self-pay | Admitting: Neurology

## 2014-05-10 NOTE — Telephone Encounter (Signed)
Patient questioning if he needs to be seen before scheduled appointment on 11/10.  Patient has noticed in the last week or so he feels disoriented and off balanced.  Please call anytime and may leave detailed message on voicemail.

## 2014-05-10 NOTE — Telephone Encounter (Signed)
Pt has an appt with Jeani Hawking, NP on 06/05/14. Sending to Jeani Hawking, NP assistant, Leota Sauers, [place]. Please advise. Thanks

## 2014-05-10 NOTE — Telephone Encounter (Signed)
Try to reschedule earlier with Jeani Hawking if  possible

## 2014-05-10 NOTE — Telephone Encounter (Signed)
Spoke with patient and he said that he is having a moment of disorientation(last for a few minutes), has been going on for a week, hard to get going and he is still having tremors.

## 2014-05-11 ENCOUNTER — Ambulatory Visit (INDEPENDENT_AMBULATORY_CARE_PROVIDER_SITE_OTHER): Payer: 59 | Admitting: Nurse Practitioner

## 2014-05-11 ENCOUNTER — Encounter: Payer: Self-pay | Admitting: Nurse Practitioner

## 2014-05-11 VITALS — BP 131/90 | HR 71 | Temp 97.3°F | Ht >= 80 in | Wt 181.0 lb

## 2014-05-11 DIAGNOSIS — R52 Pain, unspecified: Secondary | ICD-10-CM

## 2014-05-11 DIAGNOSIS — R251 Tremor, unspecified: Secondary | ICD-10-CM

## 2014-05-11 DIAGNOSIS — G20 Parkinson's disease: Secondary | ICD-10-CM

## 2014-05-11 DIAGNOSIS — G20C Parkinsonism, unspecified: Secondary | ICD-10-CM

## 2014-05-11 NOTE — Telephone Encounter (Signed)
done

## 2014-05-11 NOTE — Progress Notes (Signed)
PATIENT: [name] DOB: 1951-06-12  REASON FOR VISIT: sooner follow up for tremors, disorientation HISTORY FROM: patient  HISTORY OF PRESENT ILLNESS: [name] who since last year and a half has noticed increasing tremors mainly in the left arm and leg and to a lesser degree in the right arm as well. He states the tremors were quite mild but became more pronounced after a minor accident while staying [place] the [place] in [place]. He fell down 12 flights of steps and hit a concrete slab and had a concussion. He and some minor bruises and knee injury which took several months to reck of her period CT scan of the head was done 2 days later which was unremarkable. Since then he has had some walking difficulty but this may be related to his knee pain. He says that his feet do at times gets stuck in his started walking with a stooped posture. He however does not describe typical festination. He has resting and intermittent action tremor claiming the left arm and leg the tremor does improve with activities. The tremor does not seem to interfere with most of his routine. He denies significant bradykinesia, and drooling of saliva or micrographia. He has been evaluated by neurologists Dr. Reginia Forts at [facility] but he is unable to tell me for the diagnosis was. He was not told that this may be Parkinson's and was not tried on dopaminergic medications. He did have an MRI scan and some lab work but I do not have those results for my review available today. Patient is here today for a second opinion as he feels his tremor is not getting better. He does admit to some light masonry hallucinations as well as restless sleep thrashing of his legs. He denies significant memory or cognitive difficulties. He has not noticed any particular effect of alcohol on his tremor. Is no family history of tremors. Patient does have chronic hepatitis C and is planning to start  treatment with dapsone. He has had no seizures, significant head injury with major loss of consciousness, stroke, TIAs or significant neurological problems.   Update 03/13/2014 : He returns for followup after his initial consultation form 02/12/14. He reports slight improvement in his tremors after taking Sinemet 25/100 half a tablet 3 times daily. He is tolerating it well without dizziness, sleepiness, nightmares or hallucinations. Initially had occasional dull headaches which seemed to have stopped. His wife feels that his tremors are much less. The patient however is also complaining of mild short-term memory difficulties over the years. He particularly has difficulty remembering recent information. He denies any excessive bruising, gait balance problems, difficulty getting out of a chair or wearing his clothes. He does have some restless sleep occasionally. I am yet to receive his old neurological records from, [facility].   Update 05/11/14 (LL): Patient has noticed in the last week or so he feels more disoriented and off balanced. He called for a sooner appointment.  He is unaccompanied today.  He denies any cold or sinus symptoms, or any recent changes to his medications. He is within 2 weeks of completing his Harvoni treatment. He describes a feeling of being slightly off-balance, and disorientation, which has been going on for several months but seems to be worse in the last week and a half.  He reports feeling disorganized and has trouble following-through with tasks that he has started.  At last visit, in August, he was increased on his  dose of Sinemet, to a whole tablet three times per day, but states he cannot see any improvement in his tremors.   Upon review of his records from [facility], his complaints are similar to what he had described when he was seen in there office, dating back to early 2013. A MRI Brain with and without Contrast completed on 07/29/2011 was normal.    ROS:  14 system review of systems is positive for tremors, unbalanced feeling, disorientation   ALLERGIES: No Known Allergies  HOME MEDICATIONS: Outpatient Prescriptions Prior to Visit  Medication Sig Dispense Refill  . alfuzosin (UROXATRAL) 10 MG 24 hr tablet Take 10 mg by mouth daily with breakfast.      . aspirin EC 81 MG tablet Take 81 mg by mouth daily.      . calcium carbonate (OS-CAL) 600 MG TABS tablet Take 600 mg by mouth daily with breakfast.      . hydrochlorothiazide (HYDRODIURIL) 25 MG tablet Take 25 mg by mouth daily.      . irbesartan (AVAPRO) 150 MG tablet Take 1 tablet (150 mg total) by mouth daily.  30 tablet  5  . Ledipasvir-Sofosbuvir (HARVONI) 90-400 MG TABS Take 1 tablet by mouth daily.       . Multiple Vitamin (MULTIVITAMIN WITH MINERALS) TABS tablet Take 1 tablet by mouth daily.      [name] ofloxacin (OCUFLOX) 0.3 % ophthalmic solution Place 1 drop into the left eye 4 (four) times daily.      . prednisoLONE acetate (PRED FORTE) 1 % ophthalmic suspension Place 1 drop into the left eye 4 (four) times daily.      . verapamil (CALAN-SR) 240 MG CR tablet Take 240 mg by mouth every morning.      . pantoprazole (PROTONIX) 40 MG tablet Take 40 mg by mouth daily as needed (for acid reflux).       . Pitavastatin Calcium (LIVALO) 2 MG TABS Take 1 mg by mouth every Monday.       No facility-administered medications prior to visit.    PHYSICAL EXAM Filed Vitals:   05/11/14 1054  BP: 131/90  Pulse: 71  Temp: 97.3 F (36.3 C)  TempSrc: Oral  Height: 6\' 11"  (2.108 m)  Weight: 181 lb (82.101 kg)   Body mass index is 18.48 kg/(m^2).  MMSE - Mini Mental State Exam 05/11/2014  Orientation to time 5  Orientation to Place 5  Registration 3  Attention/ Calculation 5  Recall 2  Language- name 2 objects 2  Language- repeat 1  Language- follow 3 step command 3  Language- read & follow direction 1  Write a sentence 1  Copy design 1  Total score 29   Physical Exam   General: Pleasant middle-aged African American male seated, in no evident distress  Head: head normocephalic and atraumatic. Orohparynx benign  Neck: supple with no carotid or supraclavicular bruits. Hold macular rash over the midline lower neck anteriorly  Cardiovascular: regular rate and rhythm, no murmurs  Musculoskeletal: no deformity  Skin: no rash/petichiae  Vascular: Normal pulses all extremities   Neurologic Exam  Mental Status: Awake and fully alert. Oriented to place and time. Recent and remote memory intact.  Attention span, concentration and fund of knowledge appropriate. Mood and affect appropriate. Glabellar tap negative. No release reflexes.  Cranial Nerves: Fundoscopic exam not done. Pupils equal, briskly reactive to light. Extraocular movements full without nystagmus. Visual fields full to confrontation. Hearing intact. Facial sensation intact. Face, tongue, palate moves normally  and symmetrically.  Diminished facial expression.  Motor: Normal bulk and tone. Normal strength in all tested extremity muscles. Intermittent resting left arm and leg tremors. Very occasional mild right upper extremity tremor. The tremor is present at rest, has a slight pill-rolling characteristic, and improves with  position holding but is pronounced when he gets up or holds objects in the hand. He was asked to write a sentence and did so without micrographia. He was asked to join 2 points to make a straight line and draw a spiral and showed mild tremor on the left. There is no significant bradykinesia. He is able to get up with arms folded across his chest. There is early fatigability on rapid repetitive finger tapping on the left, but not with heel tapping on the left. Sensory: intact to touch and pinprick and vibratory sensation.  Coordination: Rapid alternating movements normal in all extremities. Finger-to-nose and heel-to-shin performed accurately bilaterally.  Gait and Station: Arises from chair  without difficulty. Stance is slightly stooped Gait demonstrates normal stride length and balance . Able to heel, toe and tandem walk without difficulty. Fair postural balance to threat and will Not fall backwards.  Reflexes: 1+ and symmetric. Toes downgoing.    DIAGNOSTIC DATA (LABS, IMAGING, TESTING) - I reviewed patient records, labs, notes, testing and imaging myself where available.  Lab Results  Component Value Date   WBC 3.8* 03/14/2014   HGB 15.6 03/14/2014   HCT 44.6 03/14/2014   MCV 89.2 03/14/2014   PLT 234 03/14/2014      Component Value Date/Time   NA 138 03/14/2014 0938   K 4.1 03/14/2014 0938   CL 102 03/14/2014 0938   CO2 23 03/14/2014 0938   GLUCOSE 85 03/14/2014 0938   BUN 15 03/14/2014 0938   CREATININE 1.01 03/14/2014 0938   CREATININE 1.14 09/15/2012 0907   CALCIUM 9.3 03/14/2014 0938   PROT 7.3 03/14/2014 0938   ALBUMIN 3.7 03/14/2014 0938   AST 19 03/14/2014 0938   ALT <5 03/14/2014 0938   ALKPHOS 56 03/14/2014 0938   BILITOT 0.5 03/14/2014 0938   GFRNONAA 77* 03/14/2014 0938   GFRAA 89* 03/14/2014 0938   Lab Results  Component Value Date   CHOL 174 09/15/2012   HDL 48 09/15/2012   LDLCALC 117* 09/15/2012   TRIG 45 09/15/2012   CHOLHDL 3.6 09/15/2012    ASSESSMENT: 35 year African American male with a year and a half history of resting left arm and leg tremors with 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  PLAN:  I had a long discussion with the patient regarding his tremors and parkinsonian symptoms, feeling of imbalace, and discuss plan for treatment and answered questions.  I do not see anything on his exam today that is worrisome, and that is reassuring for him.  I have advised him to keep well hydrated throughout the day. I am concerned that he has not seen any improvement in your tremor since starting it and increasing the dose.  I would like him to complete Harvoni treatment. After that, I would him to  decrease the dose of Sinemet back to 1/2 tablets 3 times daily to see if he sees any difference in his movement or tremor.  If there is no benefit, then we can wean it further. Return for followup in 2 months or call earlier if necessary.  Rudi Rummage Mayu Ronk, MSN, FNP-BC, A/GNP-C 05/11/2014, 12:35 PM [facility] [address] [place], [place] [address] [phone]  Note: This document was prepared with digital dictation and possible smart phrase technology. Any transcriptional errors that result from this process are unintentional.

## 2014-05-11 NOTE — Progress Notes (Signed)
I agree with the above plan 

## 2014-05-11 NOTE — Patient Instructions (Signed)
I do not see anything on your exam today that is worrisome.   There medication that you are on for tremor could possibly be causing the feelings of imbalance and disorientation that you are feeling.  I am concerned that you have not seen any improvement in your tremor since starting it and increasing the dose.   I would like you to complete your Harvoni treatment. After that, I would like to decrease you dose of Sinemet back to 1/2 tablets 3 times daily to see if you see any difference in the your movement or tremor.    You will come back in 2 months for followup.

## 2014-05-14 ENCOUNTER — Telehealth: Payer: Self-pay | Admitting: Cardiology

## 2014-05-14 MED ORDER — PITAVASTATIN CALCIUM 2 MG PO TABS: ORAL_TABLET | ORAL | Status: DC

## 2014-05-14 NOTE — Telephone Encounter (Signed)
Pt called in requesting that Dr. Ellyn Hack write him a prescription for Livalo. Please call  Thanks

## 2014-05-14 NOTE — Telephone Encounter (Signed)
Returned call to patient he stated he was given samples of Livalo 2 mg he takes 1/2 tablet once a week.Stated he needs a 90 day prescription sent to [facility].Prescription sent to pharmacy.

## 2014-06-04 ENCOUNTER — Telehealth: Payer: Self-pay | Admitting: *Deleted

## 2014-06-04 ENCOUNTER — Other Ambulatory Visit: Payer: Self-pay | Admitting: Neurology

## 2014-06-04 NOTE — Telephone Encounter (Signed)
Called patient back, left voice message to return the call in regards to his message.

## 2014-06-05 ENCOUNTER — Ambulatory Visit: Payer: 59 | Admitting: Nurse Practitioner

## 2014-06-05 NOTE — Telephone Encounter (Signed)
Yes, If the tremor is bothersome to him he may go back to the whole tablet. Keep next scheduled appt.

## 2014-06-05 NOTE — Telephone Encounter (Signed)
Called patient back and informed him of Lynn's message that if tremors are becoming bothersome then he may go back to 1 whole tablet, and keep next appointment for 08/14/14, patient verbalized understanding and had no further questions or concerns.

## 2014-06-05 NOTE — Telephone Encounter (Signed)
Patient returned my call states that at last office visit he was told to decrease Carbidopa to 1/2 tablet, states that several days after this he started back shaking, wanted to know if he should go back to the regular dosage, best contact number [phone]

## 2014-06-28 ENCOUNTER — Ambulatory Visit (INDEPENDENT_AMBULATORY_CARE_PROVIDER_SITE_OTHER): Payer: 59 | Admitting: Cardiology

## 2014-06-28 ENCOUNTER — Encounter: Payer: Self-pay | Admitting: Cardiology

## 2014-06-28 VITALS — BP 130/90 | HR 91 | Ht 70.5 in | Wt 181.0 lb

## 2014-06-28 DIAGNOSIS — I739 Peripheral vascular disease, unspecified: Secondary | ICD-10-CM

## 2014-06-28 DIAGNOSIS — Z79899 Other long term (current) drug therapy: Secondary | ICD-10-CM

## 2014-06-28 DIAGNOSIS — Z8249 Family history of ischemic heart disease and other diseases of the circulatory system: Secondary | ICD-10-CM

## 2014-06-28 DIAGNOSIS — E785 Hyperlipidemia, unspecified: Secondary | ICD-10-CM

## 2014-06-28 DIAGNOSIS — I10 Essential (primary) hypertension: Secondary | ICD-10-CM

## 2014-06-28 NOTE — Patient Instructions (Signed)
Labs lipid , cmp  START TAKING VERAPAMIL AT BEDTIME.  Your physician wants you to follow-up in 12 MONTHS Dr Ellyn Hack.  You will receive a reminder letter in the mail two months in advance. If you don't receive a letter, please call our office to schedule the follow-up appointment.

## 2014-06-28 NOTE — Assessment & Plan Note (Signed)
Again I don't see any evidence to document daily. The nose signs of claudication on exam. At this point unless he has complications or complaints, I would not consider this an active diagnosis.

## 2014-06-28 NOTE — Assessment & Plan Note (Signed)
Well-controlled on verapamil plus irbesartan  ( converted from ACE inhibitor due to cough) & HCTZ. His diastolic pressure is borderline, but otherwise pressures look good.  Continue current regimen. I will ask him to take his verapamil at night.

## 2014-06-28 NOTE — Progress Notes (Signed)
PATIENT: [name] MRN: [id number] DOB: 11-30-1950 PCP: Wyatt Haste, MD  Clinic Note: Chief Complaint  Patient presents with  . Annual Exam    SOME LITTLE SENSATION IN ARM AND CHEST  WENTTO PCP, LAST FEW WEEK FEELING LGHTHEADED, NO SOB- OCCASIONAL HAS TO TAKE A DEEP BREATHE , NO EDEMA, SOMETIME FEELS DISORIENTED AT TIMES  . Hypertension  . Hyperlipidemia    Taking Livalo 1 mg po q week   HPI: [name] is a 63 y.o. male with a PMH below who presents today for one-year followup for his cardiovascular risk factors.   He is a former patient of Dr. Myrtice Lauth then he saw Dr. Rollene Fare until he retired.  He is being followed for cardiac risk factors of hypertension, hyperlipidemia and had a catheterization performed in 2005 for chest discomfort which only showed minor coronary disease. He also negative Myoview in 2011. He was last seen by Dr. Rollene Fare in June 2014 following a trip to [place] with his wife & I first saw him in December 2014. He was doing very well with no major complaints. I felt he was stable for 1 year followup. He saw Mr. Samara Snide, [place] back in March with complaints of feeling sluggish and poor energy. He also felt like he was having some memory issues.  At that time his ACE inhibitor dose was reduced. Since his last visit he was switched from lisinopril to Avapro because of cough.  Interval History: Today presents with no real symptoms of a cardiac standpoint. He still says that he gets a little shortness of breath when he rushes up and down 20 stairs.  He also says that he will have an occasional"twinging" sensation across his chest for fleeting moments that really happened without any particular provoking activity. The symptoms that radiate across the chest and to the arms. He says that these are usually controlled if he takes Prilosec. They go away after he takes the Prilosec and then as he stays on it the symptoms do recur. He is starting to get back active  at the [facility] - usually about 3 times a week.[name] He is a little problem with pain in his left knee and right foot with plantar fasciitis so that limited saw him he is also had a recent groin strain that has kept him from doing exercise.  The remainder of cardiac review of systems is as follows: Cardiovascular ROS: no chest pain or dyspnea on exertion negative for - chest pain, edema, irregular heartbeat, loss of consciousness, murmur, orthopnea, palpitations, paroxysmal nocturnal dyspnea, rapid heart rate or shortness of breath, syncope/near syncope or TIA/amaurosis fugax symptoms. No claudication.  Past Medical History  Diagnosis Date  . Hypertension   . Allergy   . PVD (peripheral vascular disease)     With no claudication; only mild abdominal aortic atherosclerosis noted on ultrasound.  . Diverticulosis   . Hepatitis C     chronic  . HLD (hyperlipidemia)     statin intolerant (Crestor & Simvastatin) - Taking Livalo 1mg  / week  . Plantar fasciitis     right  . Head injury, closed, with concussion   . Ulcer     Prior Cardiac Evaluation and Past Surgical History: Past Surgical History  Procedure Laterality Date  . Tonsillectomy    . Cardiac catheterization  2005    30% Cx.   [name] Nm myoview ltd  2011    Neg Ischemia or infarct.  . Transthoracic echocardiogram  2013  Normal EF. No significant Valve Disease  . Colonoscopy    . Upper gastrointestinal endoscopy    . Eye surgery    . Pars plana vitrectomy Left 03/14/2014    Procedure: PARS PLANA VITRECTOMY WITH 25 GAUGE;  Surgeon: Hurman Horn, MD;  Location: [facility];  Service: Ophthalmology;  Laterality: Left;  [name] Membrane peel Left 03/14/2014    Procedure: MEMBRANE PEEL; ENDOLASER;  Surgeon: Hurman Horn, MD;  Location: [facility];  Service: Ophthalmology;  Laterality: Left;    No Known Allergies  Current Outpatient Prescriptions  Medication Sig Dispense Refill  . alfuzosin (UROXATRAL) 10 MG 24 hr tablet Take 10 mg by mouth daily with  breakfast.    . aspirin EC 81 MG tablet Take 81 mg by mouth daily.    . calcium carbonate (OS-CAL) 600 MG TABS tablet Take 600 mg by mouth daily with breakfast.    . carbidopa-levodopa (SINEMET IR) 25-100 MG per tablet TAKE 1 TABLET BY MOUTH 3 TIMES DAILY. 90 tablet 1  . chlorhexidine (PERIDEX) 0.12 % solution Use as directed 5 mLs in the mouth or throat daily.   0  . hydrochlorothiazide (HYDRODIURIL) 25 MG tablet Take 25 mg by mouth daily.    . irbesartan (AVAPRO) 150 MG tablet Take 1 tablet (150 mg total) by mouth daily. 30 tablet 5  . Ledipasvir-Sofosbuvir (HARVONI) 90-400 MG TABS Take 1 tablet by mouth daily.     [name] LOTEMAX 0.5 % GEL Place into the right eye daily.   1  . Multiple Vitamin (MULTIVITAMIN WITH MINERALS) TABS tablet Take 1 tablet by mouth daily.    [name] omeprazole (PRILOSEC) 20 MG capsule Take 20 mg by mouth daily.   11  . Pitavastatin Calcium (LIVALO) 2 MG TABS Take 1/2 tablet once a week 10 tablet 3  . timolol (TIMOPTIC) 0.5 % ophthalmic solution Place 1 drop into the right eye daily.   11  . verapamil (CALAN-SR) 240 MG CR tablet Take 240 mg by mouth every morning.     No current facility-administered medications for this visit.    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     Wife is retired Therapist, sports from [employer].   He walks roughly 2-3 miles in bed time at least 2-3 times a week. He quit smoking 30 years ago.   family history includes Cancer in his sister; Heart disease in his mother.  ROS: A comprehensive Review of Systems -  Was performed  Review of Systems  Constitutional: Negative for malaise/fatigue (Notably less fatigue and fogginess).  HENT: Negative for nosebleeds.   Respiratory: Negative for cough, shortness of breath and wheezing.   Cardiovascular: Negative for claudication.  Gastrointestinal: Negative for blood in stool and melena.  Genitourinary:       Mild erectile dysfunction; At least  2-3 episodes of nocturia per night  Musculoskeletal: Positive for myalgias (Right groin strain) and joint pain (left knee pain and right foot plantar fasciitis).  Neurological: Negative for dizziness and seizures.  Psychiatric/Behavioral: Negative for depression and memory loss. The patient is not nervous/anxious.   All other systems reviewed and are negative.    PHYSICAL EXAM BP 130/90 mmHg  Pulse 91  Ht 5' 10.5" (1.791 m)  Wt 181 lb (82.101 kg)  BMI 25.60 kg/m2 General appearance: alert and oriented x3, cooperative, appears stated age, no distress and Well-nourished/well groomed. Normal mood and affect. Answers questions appropriately. Neck: no adenopathy,  no carotid bruit, no JVD, supple, symmetrical, trachea midline and thyroid not enlarged, symmetric, no tenderness/mass/nodules Lungs: clear to auscultation bilaterally, normal percussion bilaterally and Nonlabored, good air movement Heart: normal apical impulse, S1, S2 normal, no S3 or S4 and No R./G. 1/6 SEM at the base Abdomen: soft, non-tender; bowel sounds normal; no masses,  no organomegaly Extremities: extremities normal, atraumatic, no cyanosis or edema;  Pulses: 2+ and symmetric Skin: Skin color, texture, turgor normal. No rashes or lesion; 2 tone and color change in the face the scalp. Neurologic: Grossly normal  [id number] today: Yes Rate: 73  , Rhythm: NSR, septal Q waves ( suggesting anteroseptal infarct, age undetermined) otherwise no significant changes and normal ECG.   Recent Labs:  no recent labs  Lab Results  Component Value Date   CHOL 174 09/15/2012   HDL 48 09/15/2012   LDLCALC 117* 09/15/2012   TRIG 45 09/15/2012   CHOLHDL 3.6 09/15/2012   ASSESSMENT / PLAN: Take Verapamil qPM  Hyperlipidemia with target LDL less than 100 His last lipid panel was from February 2014. He is tolerating the once a week Livalo 1 mg. We will go ahead and check another set of lipids and chemistry panel. I would like to  see if we can increase the Livalo to ~2-3 x week.   Moderate essential hypertension Well-controlled on verapamil plus irbesartan  ( converted from ACE inhibitor due to cough) & HCTZ. His diastolic pressure is borderline, but otherwise pressures look good.  Continue current regimen. I will ask him to take his verapamil at night.  PVD (peripheral vascular disease) Again I don't see any evidence to document daily. The nose signs of claudication on exam. At this point unless he has complications or complaints, I would not consider this an active diagnosis.  Family history of heart disease in male family member before age 58 Because he has hypertension and hyperlipidemia and a family history, we're continuing to monitor closely.  He is having an ischemic evaluation in the past and therefore I would not do another screening test to see if symptoms that are concerning. The fleeting symptoms of twinge in his chest seems to be more alleviated by Prilosec. It does not seem to be consistent with anginal type discomfort.    Orders Placed This Encounter  Procedures  . Comprehensive metabolic panel    Order Specific Question:  Has the patient fasted?    Answer:  Yes  . Lipid panel    Order Specific Question:  Has the patient fasted?    Answer:  Yes  . EKG 12-Lead    Followup: 12 months; Lipids now    [name], M.D., M.S. Interventional Cardiologist   Pager # [phone]

## 2014-06-28 NOTE — Assessment & Plan Note (Signed)
Because he has hypertension and hyperlipidemia and a family history, we're continuing to monitor closely.  He is having an ischemic evaluation in the past and therefore I would not do another screening test to see if symptoms that are concerning. The fleeting symptoms of twinge in his chest seems to be more alleviated by Prilosec. It does not seem to be consistent with anginal type discomfort.

## 2014-06-28 NOTE — Assessment & Plan Note (Signed)
His last lipid panel was from February 2014. He is tolerating the once a week Livalo 1 mg. We will go ahead and check another set of lipids and chemistry panel. I would like to see if we can increase the Livalo to ~2-3 x week.

## 2014-06-29 LAB — COMPREHENSIVE METABOLIC PANEL
ALBUMIN: 4.2 g/dL (ref 3.5–5.2)
ALT: 10 U/L (ref 0–53)
AST: 15 U/L (ref 0–37)
Alkaline Phosphatase: 83 U/L (ref 39–117)
BUN: 14 mg/dL (ref 6–23)
CALCIUM: 9.3 mg/dL (ref 8.4–10.5)
CHLORIDE: 103 meq/L (ref 96–112)
CO2: 27 mEq/L (ref 19–32)
Creat: 1.06 mg/dL (ref 0.50–1.35)
Glucose, Bld: 89 mg/dL (ref 70–99)
POTASSIUM: 4.2 meq/L (ref 3.5–5.3)
Sodium: 139 mEq/L (ref 135–145)
Total Bilirubin: 0.5 mg/dL (ref 0.2–1.2)
Total Protein: 7.4 g/dL (ref 6.0–8.3)

## 2014-06-29 LAB — LIPID PANEL
Cholesterol: 170 mg/dL (ref 0–200)
HDL: 45 mg/dL (ref >39)
LDL Cholesterol: 117 mg/dL — ABNORMAL HIGH (ref 0–99)
Total CHOL/HDL Ratio: 3.8 Ratio
Triglycerides: 42 mg/dL (ref <150)
VLDL: 8 mg/dL (ref 0–40)

## 2014-07-04 ENCOUNTER — Telehealth: Payer: Self-pay | Admitting: *Deleted

## 2014-07-04 MED ORDER — PITAVASTATIN CALCIUM 1 MG PO TABS: ORAL_TABLET | ORAL | Status: DC

## 2014-07-04 NOTE — Telephone Encounter (Signed)
-----   Message from [name], MD sent at 06/30/2014 10:22 PM EST ----- Lipids look almost identical to last year.  Lets see if he can take Livalo 1/2 tab for a 2nd day out of the week.  [place]

## 2014-07-04 NOTE — Telephone Encounter (Signed)
Spoke to patient. Result given . Verbalized understanding Change Livalo to 1 mg tablet from a 2 mg tablet Medication e-sent to pharmacy

## 2014-07-04 NOTE — Telephone Encounter (Signed)
Left message on voice mail.please return call

## 2014-07-09 ENCOUNTER — Other Ambulatory Visit: Payer: Self-pay | Admitting: Cardiology

## 2014-07-09 NOTE — Telephone Encounter (Signed)
Rx has been sent to the pharmacy electronically. ° °

## 2014-07-10 ENCOUNTER — Telehealth: Payer: Self-pay | Admitting: Internal Medicine

## 2014-07-10 NOTE — Telephone Encounter (Signed)
Faxed over medical records to [facility] to [phone] on 12/14

## 2014-08-08 ENCOUNTER — Other Ambulatory Visit: Payer: Self-pay | Admitting: Neurology

## 2014-08-14 ENCOUNTER — Ambulatory Visit (INDEPENDENT_AMBULATORY_CARE_PROVIDER_SITE_OTHER): Payer: 59 | Admitting: Neurology

## 2014-08-14 ENCOUNTER — Encounter: Payer: Self-pay | Admitting: Neurology

## 2014-08-14 VITALS — BP 117/80 | HR 82 | Ht 70.5 in | Wt 180.0 lb

## 2014-08-14 DIAGNOSIS — F32A Depression, unspecified: Secondary | ICD-10-CM

## 2014-08-14 DIAGNOSIS — F329 Major depressive disorder, single episode, unspecified: Secondary | ICD-10-CM

## 2014-08-14 MED ORDER — CARBIDOPA-LEVODOPA 25-100 MG PO TABS: 1.5000 | ORAL_TABLET | Freq: Three times a day (TID) | ORAL | Status: DC

## 2014-08-14 NOTE — Progress Notes (Signed)
PATIENT: [name] DOB: 1951-06-12  REASON FOR VISIT: sooner follow up for tremors, disorientation HISTORY FROM: patient  HISTORY OF PRESENT ILLNESS: [name] who since last year and a half has noticed increasing tremors mainly in the left arm and leg and to a lesser degree in the right arm as well. He states the tremors were quite mild but became more pronounced after a minor accident while staying [place] the [place]. He fell down 12 flights of steps and hit a concrete slab and had a concussion. He and some minor bruises and knee injury which took several months to reck of her period CT scan of the head was done 2 days later which was unremarkable. Since then he has had some walking difficulty but this may be related to his knee pain. He says that his feet do at times gets stuck in his started walking with a stooped posture. He however does not describe typical festination. He has resting and intermittent action tremor claiming the left arm and leg the tremor does improve with activities. The tremor does not seem to interfere with most of his routine. He denies significant bradykinesia, and drooling of saliva or micrographia. He has been evaluated by neurologists Dr. Reginia Maxwell at [facility] but he is unable to tell me for the diagnosis was. He was not told that this may be Parkinson's and was not tried on dopaminergic medications. He did have an MRI scan and some lab work but I do not have those results for my review available today. Patient is here today for a second opinion as he feels his tremor is not getting better. He does admit to some light masonry hallucinations as well as restless sleep thrashing of his legs. He denies significant memory or cognitive difficulties. He has not noticed any particular effect of alcohol on his tremor. Is no family history of tremors. Patient does have chronic hepatitis C and is planning to start  treatment with dapsone. He has had no seizures, significant head injury with major loss of consciousness, stroke, TIAs or significant neurological problems.   Update 03/13/2014 : He returns for followup after his initial consultation form 02/12/14. He reports slight improvement in his tremors after taking Sinemet 25/100 half a tablet 3 times daily. He is tolerating it well without dizziness, sleepiness, nightmares or hallucinations. Initially had occasional dull headaches which seemed to have stopped. His wife feels that his tremors are much less. The patient however is also complaining of mild short-term memory difficulties over the years. He particularly has difficulty remembering recent information. He denies any excessive bruising, gait balance problems, difficulty getting out of a chair or wearing his clothes. He does have some restless sleep occasionally. I am yet to receive his old neurological records from, [facility].   Update 05/11/14 (LL): Patient has noticed in the last week or so he feels more disoriented and off balanced. He called for a sooner appointment.  He is unaccompanied today.  He denies any cold or sinus symptoms, or any recent changes to his medications. He is within 2 weeks of completing his Harvoni treatment. He describes a feeling of being slightly off-balance, and disorientation, which has been going on for several months but seems to be worse in the last week and a half.  He reports feeling disorganized and has trouble following-through with tasks that he has started.  At last visit, in August, he was increased on his  dose of Sinemet, to a whole tablet three times per day, but states he cannot see any improvement in his tremors.   Upon review of his records from [facility], his complaints are similar to what he had described when he was seen in there office, dating back to early 2013. A MRI Brain with and without Contrast completed on 07/29/2011 was normal.    Update 08/14/2014 : He returns for follow-up after last visit 3 months ago. He feels his tremors are about the same. He had tried reducing the dose of Sinemet but noticed that his tremors got worse and hence he went back up to the original dose which is 25/100 one tablet 3 times daily. He still feels occasionally confused and at times secondary to some cells but he admits that he worries a lot. He also admits to feeling depressed, getting tired easily and not having initiated. He has not been on any medications for depression. Patient denies significant drooling of saliva, bradykinesia, gait or balance problems. He feels his tremor is mild and does not interfere with his activities of daily living. I discussed alternative treatment options including addition of dopamine agonist, anticholinergic or even consideration for deep brain  stimulation since his tremor is predominantly unilateral however the patient does not want to consider more aggressive treatment options at the present time. ROS:  14 system review of systems is positive for tremors, light sensitivity, shortness of breath, constipation, frequent waking, frequent urination, memory loss and all other systems negative ALLERGIES: No Known Allergies  HOME MEDICATIONS: Outpatient Prescriptions Prior to Visit  Medication Sig Dispense Refill  . alfuzosin (UROXATRAL) 10 MG 24 hr tablet Take 10 mg by mouth daily with breakfast.    . aspirin EC 81 MG tablet Take 81 mg by mouth daily.    . calcium carbonate (OS-CAL) 600 MG TABS tablet Take 600 mg by mouth daily with breakfast.    . hydrochlorothiazide (HYDRODIURIL) 25 MG tablet Take 25 mg by mouth daily.    . irbesartan (AVAPRO) 150 MG tablet Take 1 tablet (150 mg total) by mouth daily. 30 tablet 5  . LOTEMAX 0.5 % GEL Place into the right eye daily.   1  . Multiple Vitamin (MULTIVITAMIN WITH MINERALS) TABS tablet Take 1 tablet by mouth daily.    . Pitavastatin Calcium 1 MG TABS Take 1 tablet twice a  week (Patient taking differently: Take 2 mg by mouth. Take 1 tablet twice a week) 24 tablet 3  . verapamil (CALAN-SR) 240 MG CR tablet TAKE 1 TABLET BY MOUTH DAILY. 30 tablet 5  . carbidopa-levodopa (SINEMET IR) 25-100 MG per tablet TAKE 1 TABLET BY MOUTH 3 TIMES DAILY. 90 tablet 0  . chlorhexidine (PERIDEX) 0.12 % solution Use as directed 5 mLs in the mouth or throat daily.   0  . Ledipasvir-Sofosbuvir (HARVONI) 90-400 MG TABS Take 1 tablet by mouth daily.     [name] omeprazole (PRILOSEC) 20 MG capsule Take 20 mg by mouth daily.   11  . timolol (TIMOPTIC) 0.5 % ophthalmic solution Place 1 drop into the right eye daily.   11   No facility-administered medications prior to visit.    PHYSICAL EXAM Filed Vitals:   08/14/14 1454  BP: 117/80  Pulse: 82  Height: 5' 10.5" (1.791 m)  Weight: 180 lb (81.647 kg)   Body mass index is 25.45 kg/(m^2).  MMSE - Mini Mental State Exam 05/11/2014  Orientation to time 5  Orientation to Place 5  Registration  3  Attention/ Calculation 5  Recall 2  Language- name 2 objects 2  Language- repeat 1  Language- follow 3 step command 3  Language- read & follow direction 1  Write a sentence 1  Copy design 1  Total score 29   Physical Exam  General: Pleasant middle-aged African American male seated, in no evident distress  Head: head normocephalic and atraumatic. Orohparynx benign  Neck: supple with no carotid or supraclavicular bruits. Hold macular rash over the midline lower neck anteriorly  Cardiovascular: regular rate and rhythm, no murmurs  Musculoskeletal: no deformity  Skin: no rash/petichiae  Vascular: Normal pulses all extremities   Neurologic Exam  Mental Status: Awake and fully alert. Oriented to place and time. Recent and remote memory intact.  Attention span, concentration and fund of knowledge appropriate. Mood and affect appropriate but Geriatric depression scale 8 suggests mild depression.. Glabellar tap negative. No release reflexes.    Cranial Nerves: Fundoscopic exam not done. Pupils equal, briskly reactive to light. Extraocular movements full without nystagmus. Visual fields full to confrontation. Hearing intact. Facial sensation intact. Face, tongue, palate moves normally and symmetrically.  Diminished facial expression.  Motor: Normal bulk and tone. Normal strength in all tested extremity muscles. Intermittent resting left arm and leg tremors. Very occasional mild right upper extremity tremor. The tremor is present at rest, has a slight pill-rolling characteristic, and improves with  position holding but is pronounced when he gets up or holds objects in the hand. He was asked to write a sentence and did so without micrographia. He was asked to join 2 points to make a straight line and draw a spiral and showed mild tremor on the left. There is no significant bradykinesia. He is able to get up with arms folded across his chest. There is early fatigability on rapid repetitive finger tapping on the left, but not with heel tapping on the left. Sensory: intact to touch and pinprick and vibratory sensation.  Coordination: Rapid alternating movements normal in all extremities. Finger-to-nose and heel-to-shin performed accurately bilaterally.  Gait and Station: Arises from chair without difficulty. Stance is slightly stooped Gait demonstrates normal stride length and balance . Able to heel, toe and tandem walk without difficulty. Fair postural balance to threat and will Not fall backwards.  Reflexes: 1+ and symmetric. Toes downgoing.    DIAGNOSTIC DATA (LABS, IMAGING, TESTING) - I reviewed patient records, labs, notes, testing and imaging myself where available.  Lab Results  Component Value Date   WBC 3.8* 03/14/2014   HGB 15.6 03/14/2014   HCT 44.6 03/14/2014   MCV 89.2 03/14/2014   PLT 234 03/14/2014      Component Value Date/Time   NA 139 06/29/2014 0920   K 4.2 06/29/2014 0920   CL 103 06/29/2014 0920   CO2 27 06/29/2014  0920   GLUCOSE 89 06/29/2014 0920   BUN 14 06/29/2014 0920   CREATININE 1.06 06/29/2014 0920   CREATININE 1.01 03/14/2014 0938   CALCIUM 9.3 06/29/2014 0920   PROT 7.4 06/29/2014 0920   ALBUMIN 4.2 06/29/2014 0920   AST 15 06/29/2014 0920   ALT 10 06/29/2014 0920   ALKPHOS 83 06/29/2014 0920   BILITOT 0.5 06/29/2014 0920   GFRNONAA 77* 03/14/2014 0938   GFRAA 89* 03/14/2014 0938   Lab Results  Component Value Date   CHOL 170 06/29/2014   HDL 45 06/29/2014   LDLCALC 117* 06/29/2014   TRIG 42 06/29/2014   CHOLHDL 3.8 06/29/2014    ASSESSMENT: 63  year [place] American male with a year and a half history of resting left arm and leg tremors with 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  PLAN:  I had a long discussion with the patient with regards to his resting tremor and slight response to Sinemet. I recommend he increase the Sinemet to one and half tablet 3 times daily. I have discussed possible side effects with the patient and advised him to call me if needed. He is reluctant to try adding dopamine of conus or anticholinergics or considering referral for deep brain stimulation at the present time. He also seems to be having depression which is untreated. I encouraged him to see his primary care physician Dr. Redmond School day for treatment for depression soon. Return for follow-up in 3 months with [name] practitioner or call earlier if necessary y.  Antony Contras, MD  08/14/2014, 11:02 PM [facility] [address] [place], [place] [address] [phone]  Note: This document was prepared with digital dictation and possible smart phrase technology. Any transcriptional errors that result from this process are unintentional.

## 2014-08-14 NOTE — Patient Instructions (Signed)
I had a long discussion with the patient with regards to his resting tremor and slight response to Sinemet. I recommend he increase the Sinemet to one and half tablet 3 times daily. I have discussed possible side effects with the patient and advised him to call me if needed. He also seems to be having depression which is untreated. I encouraged him to see his primary care physician Dr. Redmond School day for treatment for depression soon. Return for follow-up in 3 months with [facility] practitioner or call earlier if necessary

## 2014-08-28 ENCOUNTER — Other Ambulatory Visit: Payer: Self-pay | Admitting: Nurse Practitioner

## 2014-08-28 DIAGNOSIS — C220 Liver cell carcinoma: Secondary | ICD-10-CM

## 2014-08-30 ENCOUNTER — Telehealth: Payer: Self-pay | Admitting: Cardiology

## 2014-08-30 MED ORDER — HYDROCHLOROTHIAZIDE 25 MG PO TABS: 25.0000 mg | ORAL_TABLET | Freq: Every day | ORAL | Status: DC

## 2014-08-30 NOTE — Telephone Encounter (Signed)
Returned call to patient he stated he just had HCTZ refilled.Stated he noticed he was suppose to be taking 25 mg daily.Stated he has only been taking 12.5 mg daily.Advised take 12.5 mg 2 tablets daily.Advised will send pharmacy a prescription for 25 mg.

## 2014-08-30 NOTE — Telephone Encounter (Signed)
Pt says he realized he have been only taking one of his Hydrochlorothiazide.he should be taking two. Should he just go ahead and start taking two from this point on?

## 2014-08-31 ENCOUNTER — Ambulatory Visit: Payer: 59 | Admitting: Sports Medicine

## 2014-09-04 ENCOUNTER — Telehealth: Payer: Self-pay | Admitting: Neurology

## 2014-09-04 MED ORDER — CARBIDOPA-LEVODOPA 25-100 MG PO TABS: 1.5000 | ORAL_TABLET | Freq: Three times a day (TID) | ORAL | Status: DC

## 2014-09-04 NOTE — Telephone Encounter (Signed)
Rx has been sent.  I called back.  He is aware.

## 2014-09-04 NOTE — Telephone Encounter (Signed)
Patient is calling to have RX Carbidopa-Levodopa 25/100 T renewed and sent to [facility] on [facility]  Please call.

## 2014-09-05 ENCOUNTER — Ambulatory Visit
Admission: RE | Admit: 2014-09-05 | Discharge: 2014-09-05 | Disposition: A | Discharge status: Home / Self Care | Payer: 59 | Source: Ambulatory Visit | Attending: Nurse Practitioner | Admitting: Nurse Practitioner

## 2014-09-05 ENCOUNTER — Ambulatory Visit (INDEPENDENT_AMBULATORY_CARE_PROVIDER_SITE_OTHER): Payer: 59 | Admitting: Sports Medicine

## 2014-09-05 ENCOUNTER — Encounter: Payer: Self-pay | Admitting: Sports Medicine

## 2014-09-05 VITALS — BP 122/84 | Ht 71.0 in | Wt 180.0 lb

## 2014-09-05 DIAGNOSIS — M7989 Other specified soft tissue disorders: Secondary | ICD-10-CM

## 2014-09-05 DIAGNOSIS — C220 Liver cell carcinoma: Secondary | ICD-10-CM

## 2014-09-05 DIAGNOSIS — M2011 Hallux valgus (acquired), right foot: Secondary | ICD-10-CM

## 2014-09-05 DIAGNOSIS — M7741 Metatarsalgia, right foot: Secondary | ICD-10-CM

## 2014-09-05 DIAGNOSIS — M2010 Hallux valgus (acquired), unspecified foot: Secondary | ICD-10-CM

## 2014-09-06 NOTE — Progress Notes (Signed)
   Subjective:    Patient ID: [name], male    DOB: 1951-02-23, 64 y.o.   MRN: [id number]  HPI  Patient comes in today with returning right foot pain. He has been previously seen in our office by Dr.Erin Maryln Gottron. He has had problems in the past with metatarsalgia and plantar fasciitis. Dr. Maryln Gottron treated him with some padding of some off-the-shelf insoles that he had purchased. She placed a small metatarsal pad and a first ray post on his right insert. This resulted in fairly good symptom resolution up until recently. He has been using these inserts in multiple pairs of shoes. Pain is identical in nature to what he experienced previously.    Review of Systems     Objective:   Physical Exam Well-developed, well-nourished. No acute distress. Awake alert and oriented 3.  Pes planus with standing. Collapse of the transverses arch bilaterally. Large bunion deformity of the right great toe. He is tender to palpation over the second metatarsal head on the right. No soft tissue swelling. Tender to palpation at the calcaneal insertion of the plantar fascia as well. Negative calcaneal squeeze. Neurovascularly intact distally.       Assessment & Plan:  Returning right foot pain secondary to metatarsalgia and plantar fasciitis Right great toe bunion  Patient was provided with two pairs of green sports insoles today. The right inserts was fitted with a small metatarsal pad and a first ray post. He will resume plantar fascial stretches and daily icing for his plantar fasciitis. We will give him a bunion pad for his right great toe. We discussed the possibility of custom orthotics at some point in the future. Follow-up when necessary.

## 2014-09-07 NOTE — Addendum Note (Signed)
Addended by: Deloria Lair A on: 09/07/2014 11:05 AM   Modules accepted: Orders

## 2014-09-17 ENCOUNTER — Telehealth: Payer: Self-pay | Admitting: Cardiology

## 2014-09-17 NOTE — Telephone Encounter (Signed)
Spoke w/ patient, communicated Dr. Allison Quarry advice. He indicated understanding.

## 2014-09-17 NOTE — Telephone Encounter (Signed)
Pt called today to report twinges in chest, episodic in nature. He reports tingling sensation in arm also. Has brief lightheadedness associated w/ this.  Notes no active CP, SOB. Neg for fatigue or diaphoresis. Notes not what he would describe as "regular CP".   He reports this symptom for past 1 & half weeks. Episodes are brief in nature, lasting a few seconds or so. Happening 2-3 times daily.  Reviewing his chart, I noted he was seen in December 2015 and had same symptoms reported then. Dr. Ellyn Hack considered Prilosec as possibly contributing to the issue. I discussed this w/ patient, he acknowledged symptoms were more or less the same. This time, he has not reported dyspnea associated w it.  He notes he has been on protonix historically and did well on it, if consideration of different GERD med is warranted.  He reports BP's are "pretty good".   Will route to Dr. Ellyn Hack to advise.

## 2014-09-17 NOTE — Telephone Encounter (Signed)
Pt have been having chest pains,getting worse the last few days.He says if he gets up too fast,he feels lightheaded.

## 2014-09-17 NOTE — Telephone Encounter (Signed)
I'm not really sure what these symptoms are. I would think that short lived symptoms are probably nothing to be concerned about. If they are persistent and worse exertion, I would consider stress test. Otherwise I think reassurance is probably the best plan here. I don't recall that he had much in the way of any significant CAD and previous evaluations.  Clarion

## 2014-09-24 ENCOUNTER — Other Ambulatory Visit: Payer: Self-pay | Admitting: Physician Assistant

## 2014-09-24 NOTE — Telephone Encounter (Signed)
Rx has been sent to the pharmacy electronically. ° °

## 2014-11-05 HISTORY — PX: OTHER SURGICAL HISTORY: SHX169

## 2014-11-13 ENCOUNTER — Ambulatory Visit: Payer: 59 | Admitting: Adult Health

## 2014-11-22 ENCOUNTER — Other Ambulatory Visit: Payer: Self-pay | Admitting: Cardiology

## 2014-11-22 NOTE — Telephone Encounter (Signed)
Rx(s) sent to pharmacy electronically.  

## 2014-12-06 ENCOUNTER — Other Ambulatory Visit: Payer: Self-pay | Admitting: Neurology

## 2014-12-11 ENCOUNTER — Ambulatory Visit: Payer: 59 | Admitting: Family Medicine

## 2014-12-11 ENCOUNTER — Ambulatory Visit (INDEPENDENT_AMBULATORY_CARE_PROVIDER_SITE_OTHER): Payer: 59 | Admitting: Family Medicine

## 2014-12-11 ENCOUNTER — Encounter: Payer: Self-pay | Admitting: Family Medicine

## 2014-12-11 VITALS — BP 98/60 | HR 76 | Wt 173.2 lb

## 2014-12-11 DIAGNOSIS — M7742 Metatarsalgia, left foot: Secondary | ICD-10-CM

## 2014-12-11 DIAGNOSIS — G5621 Lesion of ulnar nerve, right upper limb: Secondary | ICD-10-CM | POA: Diagnosis not present

## 2014-12-11 DIAGNOSIS — M722 Plantar fascial fibromatosis: Secondary | ICD-10-CM

## 2014-12-11 NOTE — Patient Instructions (Signed)
Try a shoe store one in particular is called [employer]. Go online and see if you can find that sleeve for the plantar fasciitis Work towards keeping her elbow straight at night or putting a splint on it

## 2014-12-11 NOTE — Progress Notes (Signed)
   Subjective:    Patient ID: [name], male    DOB: 01/31/1951, 64 y.o.   MRN: [id number]  HPI He is here for consultation concerning continued difficulty with right foot pain. He also thinks it is continuing to his knee and hip discomfort because he is walking with an abnormal gait. He has had an extensive evaluation. He does have an orthotic with a metatarsal pad. He's also been through physical therapy. He has been doing stretching. He unfortunately continues to have difficulty. He also notes that when he falls asleep with his armband he can sometimes have tingling in fourth and fifth fingers on the right. His goes away fairly quickly when he moves his arm.  Review of Systems     Objective:   Physical Exam Alert and in no distress. Tender to palpation over the head of the second metatarsal. No tenderness over the calcaneal spur. Good motion of the ankle.       Assessment & Plan:  Plantar fasciitis of right foot  Metatarsalgia, left  Ulnar neuropathy of right upper extremity I discussed plantar fasciitis and the metatarsalgia with him. Encouraged him to continue to do stretching. Recommend he go to the Internet and see if he can get a sleeve that will put his ankle at 90 while he sleeps. Also suggest an arch support that would help both his metatarsal and his plantar fascia. If this is unsuccessful then underwent a recommend he go back to sports medicine and have a custom orthotic made. For his elbow, I recommended keeping it is straight as possible. Discussed the possible use of the night splint to help with this.

## 2014-12-12 ENCOUNTER — Ambulatory Visit: Payer: 59 | Admitting: Nurse Practitioner

## 2014-12-21 ENCOUNTER — Ambulatory Visit: Payer: 59 | Admitting: Nurse Practitioner

## 2014-12-26 ENCOUNTER — Encounter: Payer: Self-pay | Admitting: Nurse Practitioner

## 2014-12-26 ENCOUNTER — Ambulatory Visit (INDEPENDENT_AMBULATORY_CARE_PROVIDER_SITE_OTHER): Payer: 59 | Admitting: Nurse Practitioner

## 2014-12-26 VITALS — BP 128/87 | HR 74 | Ht 71.0 in | Wt 171.8 lb

## 2014-12-26 DIAGNOSIS — R251 Tremor, unspecified: Secondary | ICD-10-CM

## 2014-12-26 DIAGNOSIS — G20 Parkinson's disease: Secondary | ICD-10-CM

## 2014-12-26 DIAGNOSIS — G3184 Mild cognitive impairment, so stated: Secondary | ICD-10-CM

## 2014-12-26 MED ORDER — CARBIDOPA-LEVODOPA 25-100 MG PO TABS: ORAL_TABLET | ORAL | Status: DC

## 2014-12-26 NOTE — Progress Notes (Signed)
I have read the note, and I agree with the clinical assessment and plan.  WILLIS,CHARLES KEITH   

## 2014-12-26 NOTE — Telephone Encounter (Signed)
Done

## 2014-12-26 NOTE — Patient Instructions (Signed)
Continue carbidopa levodopa at current dose will refill, please take medication 30 minutes before meals Memory score is stable Follow up with primary care regarding depression Follow up with [place] in 6 months

## 2014-12-26 NOTE — Progress Notes (Signed)
[facility]  PATIENT: [name] DOB: [birth year]   REASON FOR VISIT: Follow-up for tremor, mild cognitive impairment, depression HISTORY FROM: Patient    HISTORY OF PRESENT ILLNESS: HISTORY:64 year African American male who since last year and a half has noticed increasing tremors mainly in the left arm and leg and to a lesser degree in the right arm as well. He states the tremors were quite mild but became more pronounced after a minor accident while staying [place] the rental [place] in [place]. He fell down 12 flights of steps and hit a concrete slab and had a concussion. He and some minor bruises and knee injury which took several months to reck of her period CT scan of the head was done 2 days later which was unremarkable. Since then he has had some walking difficulty but this may be related to his knee pain. He says that his feet do at times gets stuck in his started walking with a stooped posture. He however does not describe typical festination. He has resting and intermittent action tremor claiming the left arm and leg the tremor does improve with activities. The tremor does not seem to interfere with most of his routine. He denies significant bradykinesia, and drooling of saliva or micrographia. He has been evaluated by neurologists Dr. Reginia Forts at [facility] neurology but he is unable to tell me for the diagnosis was. He was not told that this may be Parkinson's and was not tried on dopaminergic medications. He did have an MRI scan and some lab work but I do not have those results for my review available today. Patient is here today for a second opinion as he feels his tremor is not getting better. He does admit to some light masonry hallucinations as well as restless sleep thrashing of his legs. He denies significant memory or cognitive difficulties. He has not noticed any particular effect of alcohol on his tremor. Is no family history of tremors.  Patient does have chronic hepatitis C and is planning to start treatment with dapsone. He has had no seizures, significant head injury with major loss of consciousness, stroke, TIAs or significant neurological problems.  Update 08/14/2014 : He returns for follow-up after last visit 3 months ago. He feels his tremors are about the same. He had tried reducing the dose of Sinemet but noticed that his tremors got worse and hence he went back up to the original dose which is 25/100 one tablet 3 times daily. He still feels occasionally confused and at times secondary to some cells but he admits that he worries a lot. He also admits to feeling depressed, getting tired easily and not having initiated. He has not been on any medications for depression. Patient denies significant drooling of saliva, bradykinesia, gait or balance problems. He feels his tremor is mild and does not interfere with his activities of daily living. I discussed alternative treatment options including addition of dopamine agonist, anticholinergic or even consideration for deep brain stimulation since his tremor is predominantly unilateral however the patient does not want to consider more aggressive treatment options at the present time. UPDATE 12/26/14 [name], 64 year old black male returns for follow-up. He was last seen by Dr. [name] 08/14/2014. He feels his tremors are better since increasing his carbidopa levodopa to 1-1/2 tablets 3 times daily. He is tolerating it well without dizziness, sleepiness, nightmares or hallucinations A MRI Brain with and without Contrast completed on 07/29/2011 was normal. The Mini-Mental status exam  today is unchanged 29 out of 30. He did not follow-up with his primary care for  complaints of depression. He denies any significant drooling bradykinesia falls or balance problems. His tremor does not interfere with his activities of daily living. He returns for reevaluation  REVIEW OF SYSTEMS: Full 14 system  review of systems performed and notable only for those listed, all others are neg:  Constitutional: neg  Cardiovascular: neg Ear/Nose/Throat: neg  Skin: neg Eyes: neg Respiratory: neg Gastroitestinal: neg  Hematology/Lymphatic: neg  Endocrine: neg Musculoskeletal:neg Allergy/Immunology: neg Neurological: neg Psychiatric: neg Sleep : neg   ALLERGIES: No Known Allergies  HOME MEDICATIONS: Outpatient Prescriptions Prior to Visit  Medication Sig Dispense Refill  . alfuzosin (UROXATRAL) 10 MG 24 hr tablet Take 10 mg by mouth daily with breakfast.    . aspirin EC 81 MG tablet Take 81 mg by mouth daily.    . calcium carbonate (OS-CAL) 600 MG TABS tablet Take 600 mg by mouth daily with breakfast.    . carbidopa-levodopa (SINEMET IR) 25-100 MG per tablet TAKE 1 AND 1/2 TABLET BY MOUTH 3 TIMES DAILY. 135 tablet 1  . hydrochlorothiazide (HYDRODIURIL) 25 MG tablet Take 1 tablet (25 mg total) by mouth daily. 30 tablet 6  . hydrochlorothiazide (MICROZIDE) 12.5 MG capsule TAKE 2 CAPSULES BY MOUTH DAILY. 60 capsule 7  . irbesartan (AVAPRO) 150 MG tablet TAKE 1 TABLET (150 MG TOTAL) BY MOUTH DAILY. 30 tablet 6  . LOTEMAX 0.5 % GEL Place into the right eye daily.   1  . Multiple Vitamin (MULTIVITAMIN WITH MINERALS) TABS tablet Take 1 tablet by mouth daily.    . Pitavastatin Calcium 1 MG TABS Take 1 tablet twice a week (Patient taking differently: Take 2 mg by mouth. Take 1 tablet twice a week) 24 tablet 3  . verapamil (CALAN-SR) 240 MG CR tablet TAKE 1 TABLET BY MOUTH DAILY. 30 tablet 5   No facility-administered medications prior to visit.    PAST MEDICAL HISTORY: Past Medical History  Diagnosis Date  . Hypertension   . Allergy   . PVD (peripheral vascular disease)     With no claudication; only mild abdominal aortic atherosclerosis noted on ultrasound.  . Diverticulosis   . Hepatitis C     chronic  . HLD (hyperlipidemia)     statin intolerant (Crestor & Simvastatin) - Taking Livalo  1mg  / week  . Plantar fasciitis     right  . Head injury, closed, with concussion   . Ulcer     PAST SURGICAL HISTORY: Past Surgical History  Procedure Laterality Date  . Tonsillectomy    . Cardiac catheterization  2005    30% Cx.   [name] Nm myoview ltd  2011    Neg Ischemia or infarct.  . Transthoracic echocardiogram  2013    Normal EF. No significant Valve Disease  . Colonoscopy    . Upper gastrointestinal endoscopy    . Eye surgery    . Pars plana vitrectomy Left 03/14/2014    Procedure: PARS PLANA VITRECTOMY WITH 25 GAUGE;  Surgeon: Hurman Horn, MD;  Location: [place];  Service: Ophthalmology;  Laterality: Left;  [name] Membrane peel Left 03/14/2014    Procedure: MEMBRANE PEEL; ENDOLASER;  Surgeon: Hurman Horn, MD;  Location: [place];  Service: Ophthalmology;  Laterality: Left;  . Cataract surgery Left 11/05/2014    FAMILY HISTORY: Family History  Problem Relation Age of Onset  . Heart disease Mother   . Cancer Sister  SOCIAL HISTORY: History   Social History  . Marital Status: Married    Spouse Name: barbra gen  . Number of Children: 3  . Years of Education: AS   Occupational History  . self employed    Social History Main Topics  . Smoking status: Former Smoker    Types: Cigars    Quit date: 07/27/1988  . Smokeless tobacco: Never Used  . Alcohol Use: 0.0 oz/week    0 Standard drinks or equivalent per week     Comment: occasional  . Drug Use: No  . Sexual Activity: Yes   Other Topics Concern  . 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     Wife is retired Therapist, sports from [employer].   He walks roughly 2-3 miles in bed time at least 2-3 times a week. He quit smoking 30 years ago.     PHYSICAL EXAM  Filed Vitals:   12/26/14 0808  BP: 128/87  Pulse: 74  Height: 5\' 11"  (1.803 m)  Weight: 171 lb 12.8 oz (77.928 kg)   Body mass index is 23.97 kg/(m^2).  Generalized: Well developed, in  no acute distress  Head: normocephalic and atraumatic,. Oropharynx benign  Neck: Supple, no carotid bruits  Cardiac: Regular rate rhythm, no murmur  Musculoskeletal: No deformity   Neurological examination   Mentation: Alert oriented to time, place, history taking. Attention span and concentration appropriate. Recent and remote memory intact.  Follows all commands speech and language fluent. MMSE 29/30. AFT 17. Clock drawing 4/4.   Cranial nerve II-XII: Fundoscopic exam not done .Pupils were equal round reactive to light extraocular movements were full, visual field were full on confrontational test. Facial sensation and strength were normal. hearing was intact to finger rubbing bilaterally. Uvula tongue midline. head turning and shoulder shrug were normal and symmetric.Tongue protrusion into cheek strength was normal. Mild diminished facial expression Motor: normal bulk and tone, full strength in the BUE, BLE, fine finger movements normal, no pronator drift. No focal weakness. No bradykinesia. Minimal outstretch tremor, cogwheeling at the left elbow and wrist. Coordination: finger-nose-finger, heel-to-shin bilaterally, no dysmetria Reflexes: 1+ upper lower and symmetric plantar responses were flexor bilaterally. Gait and Station: Rising up from seated position without assistance, normal stance,  moderate stride, good arm swing, smooth turning, able to perform tiptoe, and heel walking without difficulty. Tandem gait is steady. Romberg negative  DIAGNOSTIC DATA (LABS, IMAGING, TESTING)  ASSESSMENT AND PLAN  64 y.o. 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 Good response to Sinemet. He also complains of mild cognitive impairment and depression. MMSE 29 out of 30. AFT 17. Clock drawing 4 out of 4. The patient is a current patient of Dr. [name]  who is out of the office today . This note is  sent to the work in doctor.      Continue carbidopa levodopa at current dose will refill, please take medication 30 minutes before meals Memory score is stable Follow up with primary care regarding depression Follow up with [place] in 6 months Dennie Bible, [facility], [facility], [facility] [address] [place], [place] [address] [phone]

## 2015-01-23 ENCOUNTER — Other Ambulatory Visit: Payer: Self-pay | Admitting: Cardiology

## 2015-01-23 ENCOUNTER — Telehealth: Payer: Self-pay | Admitting: Family Medicine

## 2015-01-23 MED ORDER — CLOBETASOL PROPIONATE 0.05 % EX CREA: 3.0000 "application " | TOPICAL_CREAM | Freq: Two times a day (BID) | CUTANEOUS | Status: DC

## 2015-01-23 NOTE — Telephone Encounter (Signed)
Rx(s) sent to pharmacy electronically.  

## 2015-01-23 NOTE — Telephone Encounter (Signed)
Let him know that I called it in 

## 2015-01-23 NOTE — Telephone Encounter (Signed)
Pt informed

## 2015-01-23 NOTE — Telephone Encounter (Signed)
Pt states that every summer he gets a rash around his ankles. He states that a while back you prescribed a cream and he would like a refill. Cream is Clovetasol Propionate .05%. Please send to [facility]. Pt can be reached at [phone]

## 2015-02-01 ENCOUNTER — Encounter: Payer: Self-pay | Admitting: *Deleted

## 2015-02-19 ENCOUNTER — Encounter: Payer: Self-pay | Admitting: Sports Medicine

## 2015-02-19 ENCOUNTER — Ambulatory Visit (INDEPENDENT_AMBULATORY_CARE_PROVIDER_SITE_OTHER): Payer: 59 | Admitting: Sports Medicine

## 2015-02-19 VITALS — BP 111/59 | HR 75 | Ht 71.0 in | Wt 171.0 lb

## 2015-02-19 DIAGNOSIS — M722 Plantar fascial fibromatosis: Secondary | ICD-10-CM

## 2015-02-19 DIAGNOSIS — M2011 Hallux valgus (acquired), right foot: Secondary | ICD-10-CM

## 2015-02-19 DIAGNOSIS — M79671 Pain in right foot: Secondary | ICD-10-CM

## 2015-02-19 MED ORDER — CYCLOBENZAPRINE HCL 5 MG PO TABS: 5.0000 mg | ORAL_TABLET | Freq: Every day | ORAL | Status: DC

## 2015-02-19 NOTE — Assessment & Plan Note (Signed)
Bunion may have displaced the flexor tendon causing a nodule and pain  Trial w dancer pad on insoles

## 2015-02-19 NOTE — Patient Instructions (Signed)
Flexeril: Take 1-2 tablets at night to help with the plantar fascia pain while sleeping. This medicine makes you sleepy so do not use it during the day if planning on driving.  Use the inserts in all of your shoes to see if the new pad is helping with the pain.

## 2015-02-19 NOTE — Assessment & Plan Note (Addendum)
Rigid orthotic he is currently wearing seems to provide him with some decent correction and support. Added a forefoot dancer pad in the right side for additional support of the MTP joint. Continue HEP with additional stretches given today. Trial of flexeril at night to help with the pain while sleeping. Pattern is not specific for PF so I wonder if he has some chronic irritation of medial branch of calcaneal nerve - if so flexeril may help somewhat.  Discussed with him that plantar fasciitis is generally difficult to treat and can often take 12 months to resolve. F/u in 1 month to re-eval.

## 2015-02-19 NOTE — Progress Notes (Signed)
  [name] - 64 y.o. male MRN [id number]  Date of birth: 15-Jun-1951  SUBJECTIVE:  Including CC & ROS.   Presents for right heel, great toe pain. He has a history of plantar fasciitis on the right as well as a right bunion. First had plantar fascial pain several years ago, it has gone away in the past but came back within the last 7 months. He has tried both a sport insole from [facility], and also went and bought a more rigid orthotic with arch support that he has been using; neither provided much relief. He continues to do some stretching, particularly with waking up in the morning he will use a strap to stretch the foot, but never got much relief from icing. He only recently started taking 2 tabs of ibuprofen and this has helped a little.  His forefoot pain radiates to plantar surface medial to the MTP joint 1   Dr Redmond School had suggested he might try custom orthotics. HISTORY: Past Medical, Surgical, Social, and Family History Reviewed & Updated per EMR. Pertinent Historical Findings include: Right plantar fasciitis Right HAV  PHYSICAL EXAM:  VS: BP:(!) 111/59 mmHg  HR:75bpm  TEMP: ( )  RESP:   HT:5\' 11"  (180.3 cm)   WT:171 lb (77.565 kg)  BMI:23.9 PHYSICAL EXAM: General: NAD Neuro: alert and oriented, no deficits noted. Neurologically intact both distal extremities MSK:  Foot: noted bunion right great toe. On standing with navicular drop (bilaterally, left > right). TTP plantar surface of Right 1st-3rd MTP joints, Right heel at calcaneal insertion of fascia.   Limited ultrasound right foot: Thickened plantar fascia noted at 0.54 but not markedly so.  Plantar aspect of 1st MTP noted swelling around flexor tendon with nodule This is displaced by bunion to medial plantar surface of MTP 1  ASSESSMENT & PLAN: See problem based charting & AVS for pt instructions.

## 2015-02-21 ENCOUNTER — Encounter: Payer: Self-pay | Admitting: Cardiology

## 2015-02-26 ENCOUNTER — Other Ambulatory Visit: Payer: Self-pay | Admitting: Nurse Practitioner

## 2015-02-26 DIAGNOSIS — C220 Liver cell carcinoma: Secondary | ICD-10-CM

## 2015-03-04 ENCOUNTER — Ambulatory Visit
Admission: RE | Admit: 2015-03-04 | Discharge: 2015-03-04 | Disposition: A | Discharge status: Home / Self Care | Payer: 59 | Source: Ambulatory Visit | Attending: Nurse Practitioner | Admitting: Nurse Practitioner

## 2015-03-04 DIAGNOSIS — C220 Liver cell carcinoma: Secondary | ICD-10-CM

## 2015-04-02 ENCOUNTER — Ambulatory Visit: Payer: 59 | Admitting: Sports Medicine

## 2015-04-03 ENCOUNTER — Ambulatory Visit (INDEPENDENT_AMBULATORY_CARE_PROVIDER_SITE_OTHER): Payer: 59 | Admitting: Sports Medicine

## 2015-04-03 ENCOUNTER — Encounter: Payer: Self-pay | Admitting: Sports Medicine

## 2015-04-03 VITALS — BP 142/95 | HR 74 | Ht 71.0 in | Wt 172.0 lb

## 2015-04-03 DIAGNOSIS — M2011 Hallux valgus (acquired), right foot: Secondary | ICD-10-CM

## 2015-04-03 DIAGNOSIS — M722 Plantar fascial fibromatosis: Secondary | ICD-10-CM

## 2015-04-03 NOTE — Assessment & Plan Note (Signed)
This improved with providing better arch support  He will continue to use the padded insoles He plans to return with several pairs of shoes so that we can work on getting him support and other shoes

## 2015-04-03 NOTE — Progress Notes (Signed)
Patient ID: MCKOY BHAKTA, male   DOB: 31-Jan-1951, 64 y.o.   MRN: [id number]  Patient is a DUI counselor We had seen him for some plantar fasciitis and some pain under his right first MTP We made modifications to better support his arch We used a dancer pad to float his first MTP joint He states that the plantar fascia symptoms are essentially resolved The dancer pads are lessening his pain but more than 70% I placed these on his current insoles and they remain in good condition  Physical examination No acute distress BP 142/95 mmHg  Pulse 74  Ht 5\' 11"  (1.803 m)  Wt 172 lb (78.019 kg)  BMI 24.00 kg/m2  Some flattening of along the tibial arch Mild nodularity under the right first MTP joint No tenderness to palpation at the heel Very slight tenderness to palpation at the plantar surface of the first MTP No swelling

## 2015-04-03 NOTE — Assessment & Plan Note (Signed)
Dancer pad seems to take pressure off the dorsum and the plantar surface of the first MTP joint When he returns with additional shoes we will need to replace dancer pads to relieve pain  Continue using current corrections

## 2015-04-24 ENCOUNTER — Encounter: Payer: Self-pay | Admitting: Cardiology

## 2015-04-26 ENCOUNTER — Other Ambulatory Visit: Payer: Self-pay | Admitting: *Deleted

## 2015-04-26 MED ORDER — IRBESARTAN 150 MG PO TABS: 150.0000 mg | ORAL_TABLET | Freq: Every day | ORAL | Status: DC

## 2015-05-02 ENCOUNTER — Encounter (HOSPITAL_COMMUNITY)
Admission: RE | Disposition: A | Discharge status: Home / Self Care | Payer: Self-pay | Source: Ambulatory Visit | Attending: Gastroenterology

## 2015-05-02 ENCOUNTER — Ambulatory Visit (HOSPITAL_COMMUNITY)
Admission: RE | Admit: 2015-05-02 | Discharge: 2015-05-02 | Disposition: A | Discharge status: Home / Self Care | Payer: 59 | Source: Ambulatory Visit | Attending: Gastroenterology | Admitting: Gastroenterology

## 2015-05-02 ENCOUNTER — Encounter (HOSPITAL_COMMUNITY): Payer: Self-pay

## 2015-05-02 DIAGNOSIS — Z79899 Other long term (current) drug therapy: Secondary | ICD-10-CM | POA: Diagnosis not present

## 2015-05-02 DIAGNOSIS — Z87891 Personal history of nicotine dependence: Secondary | ICD-10-CM | POA: Diagnosis not present

## 2015-05-02 DIAGNOSIS — I739 Peripheral vascular disease, unspecified: Secondary | ICD-10-CM | POA: Diagnosis not present

## 2015-05-02 DIAGNOSIS — R079 Chest pain, unspecified: Secondary | ICD-10-CM | POA: Insufficient documentation

## 2015-05-02 DIAGNOSIS — K449 Diaphragmatic hernia without obstruction or gangrene: Secondary | ICD-10-CM | POA: Diagnosis not present

## 2015-05-02 DIAGNOSIS — M109 Gout, unspecified: Secondary | ICD-10-CM | POA: Diagnosis not present

## 2015-05-02 DIAGNOSIS — Z7982 Long term (current) use of aspirin: Secondary | ICD-10-CM | POA: Diagnosis not present

## 2015-05-02 DIAGNOSIS — B182 Chronic viral hepatitis C: Secondary | ICD-10-CM | POA: Insufficient documentation

## 2015-05-02 DIAGNOSIS — I10 Essential (primary) hypertension: Secondary | ICD-10-CM | POA: Diagnosis not present

## 2015-05-02 DIAGNOSIS — E785 Hyperlipidemia, unspecified: Secondary | ICD-10-CM | POA: Insufficient documentation

## 2015-05-02 DIAGNOSIS — H409 Unspecified glaucoma: Secondary | ICD-10-CM | POA: Insufficient documentation

## 2015-05-02 HISTORY — DX: Other complications of anesthesia, initial encounter: T88.59XA

## 2015-05-02 HISTORY — PX: ESOPHAGOGASTRODUODENOSCOPY: SHX5428

## 2015-05-02 HISTORY — PX: ESOPHAGOGASTRODUODENOSCOPY: SHX1529

## 2015-05-02 HISTORY — DX: Adverse effect of unspecified anesthetic, initial encounter: T41.45XA

## 2015-05-02 SURGERY — EGD (ESOPHAGOGASTRODUODENOSCOPY)
Anesthesia: Moderate Sedation

## 2015-05-02 MED ORDER — DIPHENHYDRAMINE HCL 50 MG/ML IJ SOLN
INTRAMUSCULAR | Status: AC
  Filled 2015-05-02: qty 1

## 2015-05-02 MED ORDER — MIDAZOLAM HCL 10 MG/2ML IJ SOLN
INTRAMUSCULAR | Status: DC | PRN
  Administered 2015-05-02 (×2): 1 mg via INTRAVENOUS

## 2015-05-02 MED ORDER — SODIUM CHLORIDE 0.9 % IV SOLN
INTRAVENOUS | Status: DC
  Administered 2015-05-02: 500 mL via INTRAVENOUS

## 2015-05-02 MED ORDER — FENTANYL CITRATE (PF) 100 MCG/2ML IJ SOLN
INTRAMUSCULAR | Status: AC
  Filled 2015-05-02: qty 2

## 2015-05-02 MED ORDER — BUTAMBEN-TETRACAINE-BENZOCAINE 2-2-14 % EX AERO
INHALATION_SPRAY | CUTANEOUS | Status: DC | PRN
  Administered 2015-05-02: 2 via TOPICAL

## 2015-05-02 MED ORDER — MIDAZOLAM HCL 5 MG/ML IJ SOLN
INTRAMUSCULAR | Status: AC
  Filled 2015-05-02: qty 2

## 2015-05-02 NOTE — H&P (Signed)
[name] is an 64 y.o. male.   Chief Complaint: Chest pain responsive to PPI therapy HPI: This is a pleasant 64 year old gentleman who has recently been having episodic chest pain, abolished by PPI therapy. His last endoscopy was 10 years ago. However, lately, he's been getting some chest pain after dinner for which she will take an extra Protonix tablet with good results.  Past Medical History  Diagnosis Date  . Hypertension   . Allergy   . PVD (peripheral vascular disease) ([place])     With no claudication; only mild abdominal aortic atherosclerosis noted on ultrasound.  . Diverticulosis   . Hepatitis C     chronic  . HLD (hyperlipidemia)     statin intolerant (Crestor & Simvastatin) - Taking Livalo 1mg  / week  . Plantar fasciitis     right  . Head injury, closed, with concussion ([place])   . Ulcer   . Glaucoma   . Gout   . Complication of anesthesia     Pt. stated he had a reaction that ended in him requiring urinary cath placement    Past Surgical History  Procedure Laterality Date  . Tonsillectomy    . Cardiac catheterization  2005    30% Cx.   [name] Nm myoview ltd  2011    Neg Ischemia or infarct.  . Transthoracic echocardiogram  2013    Normal EF. No significant Valve Disease  . Colonoscopy    . Upper gastrointestinal endoscopy    . Eye surgery    . Pars plana vitrectomy Left 03/14/2014    Procedure: PARS PLANA VITRECTOMY WITH 25 GAUGE;  Surgeon: Hurman Horn, MD;  Location: [facility];  Service: Ophthalmology;  Laterality: Left;  [name] Membrane peel Left 03/14/2014    Procedure: MEMBRANE PEEL; ENDOLASER;  Surgeon: Hurman Horn, MD;  Location: [facility];  Service: Ophthalmology;  Laterality: Left;  . Cataract surgery Left 11/05/2014    Family History  Problem Relation Age of Onset  . Heart disease Mother   . Cancer Sister   . Cancer Paternal Grandfather    Social History:  reports that he quit smoking about 26 years ago. His smoking use included Cigars. He has never used  smokeless tobacco. He reports that he drinks alcohol. He reports that he does not use illicit drugs.  Allergies: No Known Allergies  Medications Prior to Admission  Medication Sig Dispense Refill  . alfuzosin (UROXATRAL) 10 MG 24 hr tablet Take 10 mg by mouth daily with breakfast.    . aspirin EC 81 MG tablet Take 81 mg by mouth daily.    . calcium carbonate (OS-CAL) 600 MG TABS tablet Take 600 mg by mouth daily with breakfast.    . carbidopa-levodopa (SINEMET IR) 25-100 MG per tablet TAKE 1 AND 1/2 TABLET BY MOUTH 3 TIMES DAILY. 405 tablet 1  . clobetasol cream (TEMOVATE) 4.74 % Apply 3 application topically 2 (two) times daily. 30 g 0  . cyclobenzaprine (FLEXERIL) 5 MG tablet Take 1-2 tablets (5-10 mg total) by mouth at bedtime. 60 tablet 1  . hydrochlorothiazide (HYDRODIURIL) 25 MG tablet Take 1 tablet (25 mg total) by mouth daily. 30 tablet 6  . hydrochlorothiazide (MICROZIDE) 12.5 MG capsule TAKE 2 CAPSULES BY MOUTH DAILY. 60 capsule 7  . irbesartan (AVAPRO) 150 MG tablet Take 1 tablet (150 mg total) by mouth daily. NEED OV. 30 tablet 0  . LOTEMAX 0.5 % GEL Place into the right eye daily.   1  .  Multiple Vitamin (MULTIVITAMIN WITH MINERALS) TABS tablet Take 1 tablet by mouth daily.    . Pitavastatin Calcium 1 MG TABS Take 1 tablet twice a week (Patient taking differently: Take 2 mg by mouth. Take 1 tablet twice a week) 24 tablet 3  . verapamil (CALAN-SR) 240 MG CR tablet TAKE 1 TABLET BY MOUTH DAILY. 30 tablet 5  . UNKNOWN TO PATIENT Atropine eye gtts and predisone eye gtts L eye  After cataract surgery ( 10/2014)      No results found for this or any previous visit (from the past 48 hour(s)). No results found.  ROS see HPI  Blood pressure 184/105, pulse 53, temperature 97.5 F (36.4 C), resp. rate 14, height 5\' 11"  (1.803 m), weight 78.019 kg (172 lb), SpO2 100 %. Physical Exam healthy-appearing African-American gentleman, no pallor or icterus. Chest clear. Heart without murmur or  arrhythmia. Abdomen soft and nontender  Assessment/Plan Proceed to endoscopic evaluation to look for adverse mucosal sequelae of GERD. If negative, we will probably try giving him twice-daily Protonix but if he has breakthrough symptoms despite that, I would then consider a barium swallow to assess esophageal motility, and/or esophageal manometry for the same purpose.  [place] V 05/02/2015, 12:04 PM

## 2015-05-02 NOTE — Op Note (Signed)
[place], [id number]   ENDOSCOPY PROCEDURE REPORT  PATIENT: [name], [name]  MR#: [id number] BIRTHDATE: April 12, 1951 , 16  yrs. old GENDER: male ENDOSCOPIST:Iya Hamed [place], MD REFERRED BY: Jill Alexanders, MD PROCEDURE DATE:  05-03-2015 PROCEDURE:   upper endoscopy ASA CLASS:    II INDICATIONS: recurring chest pain, history of hepatitis C MEDICATION: Versed 2 mg IV (no fentanyl, in view of prior history many years ago of being somewhat combative during an endoscopy) TOPICAL ANESTHETIC:   Cetacaine spray  DESCRIPTION OF PROCEDURE:   After the risks and benefits of the procedure were explained, informed consent was obtainedarea the patient came as an outpatient to the [facility] long endoscopy unit. The Pentax pediatric video upper  endoscope was introduced through the mouth  and advanced to the second portion of the duodenum . The instrument was slowly withdrawn as the mucosa was fully examined. Estimated blood loss is zero unless otherwise noted in this procedure report.    The scope entered the esophagus easily.       Apart from a minimal 1 cm hiatal hernia, the esophagus was normal, without evidence of spasm, reflux esophagitis, Barrett's esophagus, varices, infection, or neoplasia.  The stomach was entered. It contained no significant residual and had normal mucosa throughout, without evidence of gastritis, erosions, ulcers, polyps, or masses. Retroflexion showed a normal cardia.  The pylorus, duodenal bulb, and second duodenum looked normal.  The scope was then withdrawn from the patient and the procedure completed. The patient tolerated the procedure very well and there were no apparent complications. No biopsies were obtained.  COMPLICATIONS: There were no immediate complications.  ENDOSCOPIC IMPRESSION: 1. No source of patient's chest pain endoscopically evident 2. No endoscopic evidence of adverse mucosal sequelae  of gastroesophageal reflux 3. Small hiatal hernia  RECOMMENDATIONS: Try twice daily Protonix. If, despite this, the patient continues to have evening episodes of chest pain, consider either esophageal manometry and/or a barium swallow to look for evidence of esophageal spasm.   _______________________________ eSignedRonald Lobo, MD May 03, 2015 12:36 PM     cc:  CPT CODES: ICD CODES:  The ICD and CPT codes recommended by this software are interpretations from the data that the clinical staff has captured with the software.  The verification of the translation of this report to the ICD and CPT codes and modifiers is the sole responsibility of the health care institution and practicing physician where this report was generated.  [employer] will not be held responsible for the validity of the ICD and CPT codes included on this report.  AMA assumes no liability for data contained or not contained herein. CPT is a Designer, television/film set of the [employer].  PATIENT NAME:  Ra, Pfiester MR#: [id number]

## 2015-05-02 NOTE — Discharge Instructions (Signed)
Esophagogastroduodenoscopy, Care After °Refer to this sheet in the next few weeks. These instructions provide you with information about caring for yourself after your procedure. Your health care provider may also give you more specific instructions. Your treatment has been planned according to current medical practices, but problems sometimes occur. Call your health care provider if you have any problems or questions after your procedure. °WHAT TO EXPECT AFTER THE PROCEDURE °After your procedure, it is typical to feel: °· Soreness in your throat. °· Pain with swallowing. °· Sick to your stomach (nauseous). °· Bloated. °· Dizzy. °· Fatigued. °HOME CARE INSTRUCTIONS °· Do not eat or drink anything until the numbing medicine (local anesthetic) has worn off and your gag reflex has returned. You will know that the local anesthetic has worn off when you can swallow comfortably. °· Do not drive or operate machinery until directed by your health care provider. °· Take medicines only as directed by your health care provider. °SEEK MEDICAL CARE IF:  °· You cannot stop coughing. °· You are not urinating at all or less than usual. °SEEK IMMEDIATE MEDICAL CARE IF: °· You have difficulty swallowing. °· You cannot eat or drink. °· You have worsening throat or chest pain. °· You have dizziness or lightheadedness or you faint. °· You have nausea or vomiting. °· You have chills. °· You have a fever. °· You have severe abdominal pain. °· You have black, tarry, or bloody stools. °  °This information is not intended to replace advice given to you by your health care provider. Make sure you discuss any questions you have with your health care provider. °  °Document Released: 06/29/2012 Document Revised: 08/03/2014 Document Reviewed: 06/29/2012 °Elsevier Interactive Patient Education ©2016 [employer] ° ° °Moderate Conscious Sedation, Adult, Care After °Refer to this sheet in the next few weeks. These instructions provide you with  information on caring for yourself after your procedure. Your health care provider may also give you more specific instructions. Your treatment has been planned according to current medical practices, but problems sometimes occur. Call your health care provider if you have any problems or questions after your procedure. °WHAT TO EXPECT AFTER THE PROCEDURE  °After your procedure: °· You may feel sleepy, clumsy, and have poor balance for several hours. °· Vomiting may occur if you eat too soon after the procedure. °HOME CARE INSTRUCTIONS °· Do not participate in any activities where you could become injured for at least 24 hours. Do not: °¨ Drive. °¨ Swim. °¨ Ride a bicycle. °¨ Operate heavy machinery. °¨ Cook. °¨ Use power tools. °¨ Climb ladders. °¨ Work from a high place. °· Do not make important decisions or sign legal documents until you are improved. °· If you vomit, drink water, juice, or soup when you can drink without vomiting. Make sure you have little or no nausea before eating solid foods. °· Only take over-the-counter or prescription medicines for pain, discomfort, or fever as directed by your health care provider. °· Make sure you and your family fully understand everything about the medicines given to you, including what side effects may occur. °· You should not drink alcohol, take sleeping pills, or take medicines that cause drowsiness for at least 24 hours. °· If you smoke, do not smoke without supervision. °· If you are feeling better, you may resume normal activities 24 hours after you were sedated. °· Keep all appointments with your health care provider. °SEEK MEDICAL CARE IF: °· Your skin is pale or bluish   in color. °· You continue to feel nauseous or vomit. °· Your pain is getting worse and is not helped by medicine. °· You have bleeding or swelling. °· You are still sleepy or feeling clumsy after 24 hours. °SEEK IMMEDIATE MEDICAL CARE IF: °· You develop a rash. °· You have difficulty  breathing. °· You develop any type of allergic problem. °· You have a fever. °MAKE SURE YOU: °· Understand these instructions. °· Will watch your condition. °· Will get help right away if you are not doing well or get worse. °  °This information is not intended to replace advice given to you by your health care provider. Make sure you discuss any questions you have with your health care provider. °  °Document Released: 05/03/2013 Document Revised: 08/03/2014 Document Reviewed: 05/03/2013 °Elsevier Interactive Patient Education ©2016 Elsevier Inc. ° °

## 2015-05-03 ENCOUNTER — Encounter (HOSPITAL_COMMUNITY): Payer: Self-pay | Admitting: Gastroenterology

## 2015-05-08 ENCOUNTER — Ambulatory Visit: Payer: 59 | Admitting: Sports Medicine

## 2015-05-21 ENCOUNTER — Telehealth: Payer: Self-pay | Admitting: Cardiology

## 2015-05-21 NOTE — Telephone Encounter (Signed)
Returned call to patient no answer.LMTC. 

## 2015-05-21 NOTE — Telephone Encounter (Signed)
Pt called in stating that his PCP wanted him to inform Dr. Ellyn Hack that he has been having some chest pressure 2 to 3 times a week and he would like to be advised on what to do. Please call  Thanks

## 2015-05-22 NOTE — Telephone Encounter (Signed)
I think that GERD pain can be managed by PCP.  I will be happy to see him to explore other potential etiologies.  [name], MD

## 2015-05-22 NOTE — Telephone Encounter (Signed)
Left message to call.

## 2015-05-22 NOTE — Telephone Encounter (Signed)
Returning call from yesterday. °

## 2015-05-22 NOTE — Telephone Encounter (Signed)
Pt apprised of Dr. Allison Quarry recommendations. He verbalized understanding.

## 2015-05-22 NOTE — Telephone Encounter (Signed)
Mr.Brandt is returning a call. Please call   Thanks

## 2015-05-22 NOTE — Telephone Encounter (Signed)
Spoke to patient regarding his symptoms. He states that in the past, he has had CP which has been effectively relieved/managed with Protonix. He takes BID, but does not know dose. States Dr. Ellyn Hack has been aware of this issue, and that he recommended the Protonix initially. States this regimen has been working well, until about 1 week ago. He has had very brief episodes of chest discomfort. These last 1-2 minutes -- are completely relieved w/ chewable antacid and/or drinking a glass of warm water. He identifies pain as being same in character as that previously addressed. It happens approx 2-3 times a week. Pain is rated as "medium".  Pt states he was seen by PCP recently for check up and mentioned this issue - PCP thought appropriate for him to f/u w/ [place].  Pt denies SOB, denies other pain, other symptoms.  Informed patient I would inquire if Dr. Ellyn Hack had recommendation for med change from Protonix or other intervention. Advised to inform us immediately if any new/concerning symptoms. Pt voiced agreement w/ plan.

## 2015-05-30 ENCOUNTER — Other Ambulatory Visit: Payer: Self-pay | Admitting: Cardiology

## 2015-06-18 ENCOUNTER — Encounter: Payer: Self-pay | Admitting: Family Medicine

## 2015-07-02 ENCOUNTER — Other Ambulatory Visit: Payer: Self-pay | Admitting: Cardiology

## 2015-07-02 ENCOUNTER — Ambulatory Visit (INDEPENDENT_AMBULATORY_CARE_PROVIDER_SITE_OTHER): Payer: 59 | Admitting: Cardiology

## 2015-07-02 VITALS — BP 118/88 | HR 62 | Ht 71.0 in | Wt 177.8 lb

## 2015-07-02 DIAGNOSIS — Z79899 Other long term (current) drug therapy: Secondary | ICD-10-CM

## 2015-07-02 DIAGNOSIS — I739 Peripheral vascular disease, unspecified: Secondary | ICD-10-CM

## 2015-07-02 DIAGNOSIS — I700 Atherosclerosis of aorta: Secondary | ICD-10-CM

## 2015-07-02 DIAGNOSIS — R079 Chest pain, unspecified: Secondary | ICD-10-CM

## 2015-07-02 DIAGNOSIS — E785 Hyperlipidemia, unspecified: Secondary | ICD-10-CM | POA: Diagnosis not present

## 2015-07-02 DIAGNOSIS — I10 Essential (primary) hypertension: Secondary | ICD-10-CM

## 2015-07-02 MED ORDER — NITROGLYCERIN 0.4 MG SL SUBL: 0.4000 mg | SUBLINGUAL_TABLET | SUBLINGUAL | Status: DC | PRN

## 2015-07-02 MED ORDER — IRBESARTAN 150 MG PO TABS: 150.0000 mg | ORAL_TABLET | Freq: Every day | ORAL | Status: DC

## 2015-07-02 MED ORDER — PITAVASTATIN CALCIUM 1 MG PO TABS: 1.0000 mg | ORAL_TABLET | ORAL | Status: DC

## 2015-07-02 NOTE — Progress Notes (Signed)
PCP: Wyatt Haste, MD  Clinic Note: Chief Complaint  Patient presents with  . Chest Pain    pt states that he has a discomfort in hos chest that hurts  . Annual Exam    pt states he feels like he maybe having esophagus spasms that radiates to both arms goes away in about a minute  . Edema    no edema  . Shortness of Breath    some SOB     HPI: [name] is a 64 y.o. male with a PMH below who presents today for for one-year followup for his cardiovascular risk factors.  He is a former patient of Dr. Myrtice Lauth then he saw Dr. Rollene Fare until he retired.  He is being followed for cardiac risk factors of hypertension, hyperlipidemia and had a catheterization performed in 2005 for chest discomfort which only showed minor coronary disease. He also negative Myoview in 2011. He was last seen by Dr. Rollene Fare in June 2014 following a trip to [place] with his wife & I first saw him in December 2014. He was doing very well with no major complaints. I felt he was stable for 1 year followup. He saw Mr. Samara Snide, [place] back in March with complaints of feeling sluggish and poor energy. He also felt like he was having some memory issues. At that time his ACE inhibitor dose was reduced. Was converted from lisinopril to Avapro because of cough.  [name] was last seen in Dec 2015  Recent Hospitalizations: None Sees Neurology for tremor - on Sinemet.  Studies Reviewed:  None  Interval History: He is doing pretty well - still has his intermittent episodes of chest discomfort.  Usually, these spells occur while in bed or sitting & relaxing.  No clear relation with activity or any particular foods.  Often improves with drinking warm water.  GI MD seems to think that it could be esophageal spasm.  Had EGD in Oct- nothing remarkable. Otherwise denies any significant dyspnea with rest or exertion.  Sx last no more than ~5-10 min  No CHF Sx of  PND, orthopnea or edema. No palpitations,  lightheadedness, dizziness, weakness or syncope/near syncope. No TIA/amaurosis fugax symptoms. No claudication.  ROS: A comprehensive was performed. Review of Systems  Constitutional: Negative for malaise/fatigue.  HENT: Negative for nosebleeds.   Respiratory: Negative for shortness of breath.   Gastrointestinal: Negative for blood in stool and melena.  Genitourinary: Negative for hematuria.       Still 2-3 episodes of nocturia / night  Musculoskeletal: Positive for joint pain. Negative for myalgias.  Neurological: Positive for tremors (sees Neuro). Negative for headaches.  Endo/Heme/Allergies: Does not bruise/bleed easily.  Psychiatric/Behavioral: Negative.   All other systems reviewed and are negative.    Past Medical History  Diagnosis Date  . Hypertension   . Allergy   . PVD (peripheral vascular disease) ([place])     With no claudication; only mild abdominal aortic atherosclerosis noted on ultrasound.  . Diverticulosis   . Hepatitis C     chronic  . HLD (hyperlipidemia)     statin intolerant (Crestor & Simvastatin) - Taking Livalo 1mg  / week  . Plantar fasciitis     right  . Head injury, closed, with concussion ([place])   . Ulcer   . Glaucoma   . Gout   . Complication of anesthesia     Pt. stated he had a reaction that ended in him requiring urinary cath placement  Past Surgical History  Procedure Laterality Date  . Tonsillectomy    . Cardiac catheterization  2005    30% Cx.   [name] Nm myoview ltd  2011    Neg Ischemia or infarct.  . Transthoracic echocardiogram  2013    Normal EF. No significant Valve Disease  . Colonoscopy    . Upper gastrointestinal endoscopy    . Eye surgery    . Pars plana vitrectomy Left 03/14/2014    Procedure: PARS PLANA VITRECTOMY WITH 25 GAUGE;  Surgeon: Hurman Horn, MD;  Location: [facility];  Service: Ophthalmology;  Laterality: Left;  [name] Membrane peel Left 03/14/2014    Procedure: MEMBRANE PEEL; ENDOLASER;  Surgeon: Hurman Horn, MD;   Location: [facility];  Service: Ophthalmology;  Laterality: Left;  . Cataract surgery Left 11/05/2014  . Esophagogastroduodenoscopy N/A 05/02/2015    Procedure: ESOPHAGOGASTRODUODENOSCOPY (EGD);  Surgeon: Ronald Lobo, MD;  Location: [facility];  Service: Endoscopy;  Laterality: N/A;  . Esophagogastroduodenoscopy  05/02/2015    no source of pt chest pain endoscopically evident. small hiatal hernia.    Prior to Admission medications   Medication Sig Start Date End Date Taking? Authorizing Provider  alfuzosin (UROXATRAL) 10 MG 24 hr tablet Take 10 mg by mouth daily with breakfast.   Yes Historical Provider, MD  aspirin EC 81 MG tablet Take 81 mg by mouth daily.   Yes Historical Provider, MD  calcium carbonate (OS-CAL) 600 MG TABS tablet Take 600 mg by mouth daily with breakfast.   Yes Historical Provider, MD  carbidopa-levodopa (SINEMET IR) 25-100 MG per tablet TAKE 1 AND 1/2 TABLET BY MOUTH 3 TIMES DAILY. 12/26/14  Yes Dennie Bible, NP  hydrochlorothiazide (HYDRODIURIL) 25 MG tablet Take 1 tablet (25 mg total) by mouth daily. 08/30/14  Yes [name], MD  hydrochlorothiazide (MICROZIDE) 12.5 MG capsule TAKE 2 CAPSULES BY MOUTH DAILY. 11/22/14  Yes [name], MD  irbesartan (AVAPRO) 150 MG tablet TAKE 1 TABLET BY MOUTH DAILY. 05/31/15  Yes [name], MD  LOTEMAX 0.5 % GEL Place into the right eye daily.  05/10/14  Yes Historical Provider, MD  Multiple Vitamin (MULTIVITAMIN WITH MINERALS) TABS tablet Take 1 tablet by mouth daily.   Yes Historical Provider, MD  pantoprazole (PROTONIX) 40 MG tablet Take 40 mg by mouth 2 (two) times daily. 06/17/15  Yes Historical Provider, MD  Pitavastatin Calcium 1 MG TABS Take 1 tablet twice a week Patient taking differently: Take 2 mg by mouth. Take 1 tablet twice a week 07/04/14  Yes [name], MD  timolol (TIMOPTIC) 0.5 % ophthalmic solution Place 0.5 drops into both eyes daily. 05/27/15  Yes Historical Provider, MD  verapamil (CALAN-SR)  240 MG CR tablet TAKE 1 TABLET BY MOUTH DAILY. 01/23/15  Yes [name], MD   No Known Allergies   Social History   Social History  . Marital Status: Married    Spouse Name: barbra gen  . Number of Children: 3  . Years of Education: AS   Occupational History  . self employed    Social History Main Topics  . Smoking status: Former Smoker    Types: Cigars    Quit date: 07/27/1988  . Smokeless tobacco: Never Used  . Alcohol Use: 1.8 oz/week    0 Standard drinks or equivalent, 1 Glasses of wine, 1 Shots of liquor, 1 Cans of beer per week     Comment: occasional  . Drug Use: No  . Sexual  Activity: Yes   Other Topics Concern  . 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     Wife is retired Therapist, sports from [employer].   He walks roughly 2-3 miles in bed time at least 2-3 times a week. He quit smoking 30 years ago.   Family History  Problem Relation Age of Onset  . Heart disease Mother   . Cancer Sister   . Cancer Paternal Grandfather      Wt Readings from Last 3 Encounters:  07/03/15 176 lb 9.6 oz (80.105 kg)  07/02/15 177 lb 12.8 oz (80.65 kg)  05/02/15 172 lb (78.019 kg)    PHYSICAL EXAM BP 118/88 mmHg  Pulse 62  Ht 5\' 11"  (1.803 m)  Wt 177 lb 12.8 oz (80.65 kg)  BMI 24.81 kg/m2 General appearance: alert and oriented x3, cooperative, appears stated age, no distress and Well-nourished/well groomed. Normal mood and affect. Answers questions appropriately. Neck: no adenopathy, no carotid bruit, no JVD, supple, symmetrical, trachea midline and thyroid not enlarged, symmetric, no tenderness/mass/nodules Lungs: clear to auscultation bilaterally, normal percussion bilaterally and Nonlabored, good air movement Heart: normal apical impulse, S1, S2 normal, no S3 or S4 and No R./G. 1/6 SEM at the base Abdomen: soft, non-tender; bowel sounds normal; no masses, no organomegaly Extremities: extremities normal,  atraumatic, no cyanosis or edema;  Pulses: 2+ and symmetric Skin: Skin color, texture, turgor normal. No rashes or lesion; 2 tone and color change in the face the scalp. Neurologic: Grossly normal    Adult ECG Report  Rate: 62 ;  Rhythm: normal sinus rhythm and CRO Anteroseptal MI, age undetermined;   Narrative Interpretation: Stable EKG.  Normal axis, intervals & durations.   Other studies Reviewed: Additional studies/ records that were reviewed today include:  Recent Labs:  Ordered today Lab Results  Component Value Date   CHOL 150 07/02/2015   HDL 50 07/02/2015   LDLCALC 93 07/02/2015   TRIG 37 07/02/2015   CHOLHDL 3.0 07/02/2015   Lab Results  Component Value Date   CREATININE 1.06 06/29/2014    ASSESSMENT / PLAN: Problem List Items Addressed This Visit    Moderate essential hypertension - Primary (Chronic)    BP well controlled on ARB & full dose HCTZ.      Relevant Medications   nitroGLYCERIN (NITROSTAT) 0.4 MG SL tablet   irbesartan (AVAPRO) 150 MG tablet   Pitavastatin Calcium 1 MG TABS   Other Relevant Orders   EKG 12-Lead (Completed)   Lipid panel (Completed)   Hepatic function panel (Completed)   Hyperlipidemia with target LDL less than 100 (Chronic)    FLP & CMP ordered today - see above (added post-hoc) On once weekly Livalo --> pretty much @ goal.  Will stay with current interval.      Relevant Medications   nitroGLYCERIN (NITROSTAT) 0.4 MG SL tablet   irbesartan (AVAPRO) 150 MG tablet   Pitavastatin Calcium 1 MG TABS   Other Relevant Orders   EKG 12-Lead (Completed)   Lipid panel (Completed)   Hepatic function panel (Completed)   Chest pain with minimal risk for cardiac etiology    Atypical sounding chest occurs @ rest - as potential cardiac cause could be coronary spasm & GI cause could be esophageal spasm, will try PRN NTG.      Relevant Orders   EKG 12-Lead (Completed)   Lipid panel (Completed)   Hepatic function panel (Completed)  Abdominal aortic atherosclerosis (HCC) (Chronic)    No claudication - no documentation -- will removed from Problem List. Aortic Atherosclerosis noted on ultrasound. Rx  RFs.      Relevant Medications   nitroGLYCERIN (NITROSTAT) 0.4 MG SL tablet   irbesartan (AVAPRO) 150 MG tablet   Pitavastatin Calcium 1 MG TABS    Other Visit Diagnoses    Drug therapy        Relevant Orders    Lipid panel (Completed)    Hepatic function panel (Completed)       Current medicines are reviewed at length with the patient today. (+/- concerns) still having intermittent chest "discomfort"  The following changes have been made: Added PRN NTG to treat possible coronary or esophageal spasm.   Studies Ordered:   Orders Placed This Encounter  Procedures  . Lipid panel  . Hepatic function panel  . EKG 12-Lead      [name], M.D., M.S. Interventional Cardiologist   Pager # [phone]

## 2015-07-02 NOTE — Patient Instructions (Signed)
NTG TABLET - MAY USE IF YOU HAVE CHEST DISCOMFORT LET OFFICE KNOW IF THIS WORKS.  LABS- LIPID   Your physician wants you to follow-up in [place].  You will receive a reminder letter in the mail two months in advance. If you don't receive a letter, please call our office to schedule the follow-up appointment.

## 2015-07-03 ENCOUNTER — Ambulatory Visit (INDEPENDENT_AMBULATORY_CARE_PROVIDER_SITE_OTHER): Payer: 59 | Admitting: Neurology

## 2015-07-03 ENCOUNTER — Encounter: Payer: Self-pay | Admitting: Neurology

## 2015-07-03 VITALS — BP 108/72 | HR 67 | Ht 71.0 in | Wt 176.6 lb

## 2015-07-03 DIAGNOSIS — R251 Tremor, unspecified: Secondary | ICD-10-CM

## 2015-07-03 LAB — HEPATIC FUNCTION PANEL
ALT: 5 U/L — ABNORMAL LOW (ref 9–46)
AST: 20 U/L (ref 10–35)
Albumin: 4.5 g/dL (ref 3.6–5.1)
Alkaline Phosphatase: 59 U/L (ref 40–115)
BILIRUBIN DIRECT: 0.1 mg/dL (ref <=0.2)
BILIRUBIN INDIRECT: 0.5 mg/dL (ref 0.2–1.2)
Total Bilirubin: 0.6 mg/dL (ref 0.2–1.2)
Total Protein: 7.4 g/dL (ref 6.1–8.1)

## 2015-07-03 LAB — LIPID PANEL
Cholesterol: 150 mg/dL (ref 125–200)
HDL: 50 mg/dL (ref >=40)
LDL CALC: 93 mg/dL (ref <130)
Total CHOL/HDL Ratio: 3 Ratio (ref <=5.0)
Triglycerides: 37 mg/dL (ref <150)
VLDL: 7 mg/dL (ref <30)

## 2015-07-03 NOTE — Patient Instructions (Signed)
I had a long discussion with the patient with regards to his tremors which seem to have responded well to the current dose of Sinemet 25/100 one and half tablet 3 times daily. He is tolerating this dose without any side effects hence I recommend he stay on the current dose. He was advised to return for follow-up on the new year or call earlier if necessary.

## 2015-07-03 NOTE — Progress Notes (Signed)
[facility]  PATIENT: [name] DOB: [birth year]   REASON FOR VISIT: Follow-up for tremor, mild cognitive impairment, depression HISTORY FROM: Patient    HISTORY OF PRESENT ILLNESS: HISTORY:64 year African American male who since last year and a half has noticed increasing tremors mainly in the left arm and leg and to a lesser degree in the right arm as well. He states the tremors were quite mild but became more pronounced after a minor accident while staying [place] the rental [place] in [place]. He fell down 12 flights of steps and hit a concrete slab and had a concussion. He and some minor bruises and knee injury which took several months to reck of her period CT scan of the head was done 2 days later which was unremarkable. Since then he has had some walking difficulty but this may be related to his knee pain. He says that his feet do at times gets stuck in his started walking with a stooped posture. He however does not describe typical festination. He has resting and intermittent action tremor claiming the left arm and leg the tremor does improve with activities. The tremor does not seem to interfere with most of his routine. He denies significant bradykinesia, and drooling of saliva or micrographia. He has been evaluated by neurologists Dr. Reginia Forts at [facility] neurology but he is unable to tell me for the diagnosis was. He was not told that this may be Parkinson's and was not tried on dopaminergic medications. He did have an MRI scan and some lab work but I do not have those results for my review available today. Patient is here today for a second opinion as he feels his tremor is not getting better. He does admit to some light masonry hallucinations as well as restless sleep thrashing of his legs. He denies significant memory or cognitive difficulties. He has not noticed any particular effect of alcohol on his tremor. Is no family history of tremors.  Patient does have chronic hepatitis C and is planning to start treatment with dapsone. He has had no seizures, significant head injury with major loss of consciousness, stroke, TIAs or significant neurological problems.  Update 08/14/2014 : He returns for follow-up after last visit 3 months ago. He feels his tremors are about the same. He had tried reducing the dose of Sinemet but noticed that his tremors got worse and hence he went back up to the original dose which is 25/100 one tablet 3 times daily. He still feels occasionally confused and at times secondary to some cells but he admits that he worries a lot. He also admits to feeling depressed, getting tired easily and not having initiated. He has not been on any medications for depression. Patient denies significant drooling of saliva, bradykinesia, gait or balance problems. He feels his tremor is mild and does not interfere with his activities of daily living. I discussed alternative treatment options including addition of dopamine agonist, anticholinergic or even consideration for deep brain stimulation since his tremor is predominantly unilateral however the patient does not want to consider more aggressive treatment options at the present time. UPDATE 12/26/14 [name], 64 year old black male returns for follow-up. He was last seen by Dr. [name] 08/14/2014. He feels his tremors are better since increasing his carbidopa levodopa to 1-1/2 tablets 3 times daily. He is tolerating it well without dizziness, sleepiness, nightmares or hallucinations A MRI Brain with and without Contrast completed on 07/29/2011 was normal. The Mini-Mental status exam  today is unchanged 29 out of 30. He did not follow-up with his primary care for  complaints of depression. He denies any significant drooling bradykinesia falls or balance problems. His tremor does not interfere with his activities of daily living. He returns for reevaluation Update 07/03/2015 : He returns for  follow-up after last visit 6 months ago. He states his tremors continue to do well and he seems to have responded quite well to increased dose of Sinemet 25/100 1-1/2 tablet 3 times daily. Is tolerating it well without any dizziness, sleepiness, nightmares, hallucinations, nausea or diarrhea. He continues to have mild memory difficulties but his has not been on any treatment for depression and in fact has not seen his primary care physician for the same for quite some time. REVIEW OF SYSTEMS: Full 14 system review of systems performed and notable only for those listed, all others are neg:    Memory loss, tremors only ALLERGIES: No Known Allergies  HOME MEDICATIONS: Outpatient Prescriptions Prior to Visit  Medication Sig Dispense Refill  . alfuzosin (UROXATRAL) 10 MG 24 hr tablet Take 10 mg by mouth daily with breakfast.    . aspirin EC 81 MG tablet Take 81 mg by mouth daily.    . calcium carbonate (OS-CAL) 600 MG TABS tablet Take 600 mg by mouth daily with breakfast.    . carbidopa-levodopa (SINEMET IR) 25-100 MG per tablet TAKE 1 AND 1/2 TABLET BY MOUTH 3 TIMES DAILY. 405 tablet 1  . hydrochlorothiazide (HYDRODIURIL) 25 MG tablet Take 1 tablet (25 mg total) by mouth daily. 30 tablet 6  . hydrochlorothiazide (MICROZIDE) 12.5 MG capsule TAKE 2 CAPSULES BY MOUTH DAILY. 60 capsule 7  . irbesartan (AVAPRO) 150 MG tablet Take 1 tablet (150 mg total) by mouth daily. 90 tablet 3  . LOTEMAX 0.5 % GEL Place into the right eye daily.   1  . Multiple Vitamin (MULTIVITAMIN WITH MINERALS) TABS tablet Take 1 tablet by mouth daily.    . nitroGLYCERIN (NITROSTAT) 0.4 MG SL tablet Place 1 tablet (0.4 mg total) under the tongue every 5 (five) minutes as needed for chest pain. 25 tablet 6  . pantoprazole (PROTONIX) 40 MG tablet Take 40 mg by mouth 2 (two) times daily.  0  . Pitavastatin Calcium 1 MG TABS Take 1 tablet (1 mg total) by mouth 2 (two) times a week. 24 tablet 3  . timolol (TIMOPTIC) 0.5 % ophthalmic  solution Place 0.5 drops into both eyes daily.  6  . verapamil (CALAN-SR) 240 MG CR tablet TAKE 1 TABLET BY MOUTH DAILY. 30 tablet 5   No facility-administered medications prior to visit.    PAST MEDICAL HISTORY: Past Medical History  Diagnosis Date  . Hypertension   . Allergy   . PVD (peripheral vascular disease) ([place])     With no claudication; only mild abdominal aortic atherosclerosis noted on ultrasound.  . Diverticulosis   . Hepatitis C     chronic  . HLD (hyperlipidemia)     statin intolerant (Crestor & Simvastatin) - Taking Livalo 1mg  / week  . Plantar fasciitis     right  . Head injury, closed, with concussion ([place])   . Ulcer   . Glaucoma   . Gout   . Complication of anesthesia     Pt. stated he had a reaction that ended in him requiring urinary cath placement    PAST SURGICAL HISTORY: Past Surgical History  Procedure Laterality Date  . Tonsillectomy    . Cardiac catheterization  2005    30% Cx.   [name] Nm myoview ltd  2011    Neg Ischemia or infarct.  . Transthoracic echocardiogram  2013    Normal EF. No significant Valve Disease  . Colonoscopy    . Upper gastrointestinal endoscopy    . Eye surgery    . Pars plana vitrectomy Left 03/14/2014    Procedure: PARS PLANA VITRECTOMY WITH 25 GAUGE;  Surgeon: Hurman Horn, MD;  Location: [facility];  Service: Ophthalmology;  Laterality: Left;  [name] Membrane peel Left 03/14/2014    Procedure: MEMBRANE PEEL; ENDOLASER;  Surgeon: Hurman Horn, MD;  Location: [facility];  Service: Ophthalmology;  Laterality: Left;  . Cataract surgery Left 11/05/2014  . Esophagogastroduodenoscopy N/A 05/02/2015    Procedure: ESOPHAGOGASTRODUODENOSCOPY (EGD);  Surgeon: Ronald Lobo, MD;  Location: [facility];  Service: Endoscopy;  Laterality: N/A;  . Esophagogastroduodenoscopy  05/02/2015    no source of pt chest pain endoscopically evident. small hiatal hernia.    FAMILY HISTORY: Family History  Problem Relation Age of Onset  . Heart disease Mother    . Cancer Sister   . Cancer Paternal Grandfather     SOCIAL HISTORY: Social History   Social History  . Marital Status: Married    Spouse Name: barbra gen  . Number of Children: 3  . Years of Education: AS   Occupational History  . self employed    Social History Main Topics  . Smoking status: Former Smoker    Types: Cigars    Quit date: 07/27/1988  . Smokeless tobacco: Never Used  . Alcohol Use: 1.8 oz/week    0 Standard drinks or equivalent, 1 Glasses of wine, 1 Shots of liquor, 1 Cans of beer per week     Comment: occasional  . Drug Use: No  . Sexual Activity: Yes   Other Topics Concern  . 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     Wife is retired Therapist, sports from [employer].   He walks roughly 2-3 miles in bed time at least 2-3 times a week. He quit smoking 30 years ago.     PHYSICAL EXAM  Filed Vitals:   07/03/15 0941  BP: 108/72  Pulse: 67  Height: 5\' 11"  (1.803 m)  Weight: 176 lb 9.6 oz (80.105 kg)   Body mass index is 24.64 kg/(m^2).  Generalized: Well developed, in no acute distress  Head: normocephalic and atraumatic,. Oropharynx benign  Neck: Supple, no carotid bruits  Cardiac: Regular rate rhythm, no murmur  Musculoskeletal: No deformity   Neurological examination   Mentation: Alert oriented to time, place, history taking. Attention span and concentration appropriate. Recent and remote memory intact.  Follows all commands speech and language fluent. MMSE not done today  Cranial nerve II-XII: Fundoscopic exam not done .Pupils were equal round reactive to light extraocular movements were full, visual field were full on confrontational test. Facial sensation and strength were normal. hearing was intact to finger rubbing bilaterally. Uvula tongue midline. head turning and shoulder shrug were normal and symmetric.Tongue protrusion into cheek strength was normal. Mild diminished facial  expression Motor: normal bulk and tone, full strength in the BUE, BLE, fine finger movements normal, no pronator drift. No focal weakness. No bradykinesia. Minimal outstretch tremor, cogwheeling at the left elbow and wrist. Coordination: finger-nose-finger, heel-to-shin bilaterally, no dysmetria Reflexes: 1+ upper lower and symmetric plantar responses were flexor bilaterally. Gait and Station: Rising  up from seated position without assistance, normal stance,  moderate stride, good arm swing, smooth turning, able to perform tiptoe, and heel walking without difficulty. Tandem gait is steady. Romberg negative Good postural response to threat  DIAGNOSTIC DATA (LABS, IMAGING, TESTING)  ASSESSMENT AND PLAN  64 y.o. 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 Good response to Sinemet. He also complains of mild cognitive impairment and depression but is not on treatment for the same.   I had a long discussion with the patient with regards to his tremors which seem to have responded well to the current dose of Sinemet 25/100 one and half tablet 3 times daily. He is tolerating this dose without any side effects hence I recommend he stay on the current dose. He was advised to follow-up with his primary care physician for his mild memory loss which may be related to untreated depression. He was advised to return for follow-up on the new year or call earlier if necessary. Antony Contras, MD [facility] [address], [place], [place] [address] [phone]

## 2015-07-04 ENCOUNTER — Encounter: Payer: Self-pay | Admitting: Cardiology

## 2015-07-04 DIAGNOSIS — R079 Chest pain, unspecified: Secondary | ICD-10-CM | POA: Insufficient documentation

## 2015-07-04 NOTE — Assessment & Plan Note (Signed)
BP well controlled on ARB & full dose HCTZ.

## 2015-07-04 NOTE — Assessment & Plan Note (Signed)
FLP & CMP ordered today - see above (added post-hoc) On once weekly Livalo --> pretty much @ goal.  Will stay with current interval.

## 2015-07-04 NOTE — Assessment & Plan Note (Signed)
Atypical sounding chest occurs @ rest - as potential cardiac cause could be coronary spasm & GI cause could be esophageal spasm, will try PRN NTG.

## 2015-07-04 NOTE — Assessment & Plan Note (Addendum)
No claudication - no documentation -- will removed from Problem List. Aortic Atherosclerosis noted on ultrasound. Rx  RFs.

## 2015-07-19 ENCOUNTER — Telehealth: Payer: Self-pay | Admitting: *Deleted

## 2015-07-19 NOTE — Telephone Encounter (Signed)
Spoke to patient. labs Result given . Verbalized understanding

## 2015-07-19 NOTE — Telephone Encounter (Signed)
-----   Message from [name], MD sent at 07/18/2015  9:55 PM EST ----- Notes Recorded by [name], MD on 07/18/2015 at 9:54 PM Cholesterol levels look better on current plan. LDL down to 93 from 117 !!!. TC down to 150 from 170. HD up to 50 from 78. ALL are movements in the right direction. We are at goal!!!.  No change. Chemistries, kidney & liver fxn are all stable.

## 2015-07-26 ENCOUNTER — Other Ambulatory Visit: Payer: Self-pay | Admitting: Cardiology

## 2015-07-26 MED ORDER — HYDROCHLOROTHIAZIDE 12.5 MG PO CAPS: 12.5000 mg | ORAL_CAPSULE | Freq: Two times a day (BID) | ORAL | Status: DC

## 2015-07-26 NOTE — Telephone Encounter (Signed)
°*  STAT* If patient is at the pharmacy, call can be transferred to refill team.   1. Which medications need to be refilled? (please list name of each medication and dose if known) HCTZ  2. Which pharmacy/location (including street and city if local pharmacy) is medication to be sent to? [facility]   3. Do they need a 30 day or 90 day supply? [place]

## 2015-07-26 NOTE — Telephone Encounter (Signed)
Spoke to pt. Sent Rx in to pharmacy.

## 2015-07-31 DIAGNOSIS — H34832 Tributary (branch) retinal vein occlusion, left eye, with macular edema: Secondary | ICD-10-CM | POA: Diagnosis not present

## 2015-08-02 DIAGNOSIS — H26492 Other secondary cataract, left eye: Secondary | ICD-10-CM | POA: Diagnosis not present

## 2015-08-02 DIAGNOSIS — H35352 Cystoid macular degeneration, left eye: Secondary | ICD-10-CM | POA: Diagnosis not present

## 2015-08-02 DIAGNOSIS — H40053 Ocular hypertension, bilateral: Secondary | ICD-10-CM | POA: Diagnosis not present

## 2015-08-02 DIAGNOSIS — Z961 Presence of intraocular lens: Secondary | ICD-10-CM | POA: Diagnosis not present

## 2015-08-06 MED FILL — TIMOLOL 0.5% EYE DROPS: 0.5 | 30 days supply | Qty: 5 | Fill #1

## 2015-08-08 ENCOUNTER — Ambulatory Visit (INDEPENDENT_AMBULATORY_CARE_PROVIDER_SITE_OTHER): Payer: 59 | Admitting: Family Medicine

## 2015-08-08 ENCOUNTER — Ambulatory Visit
Admission: RE | Admit: 2015-08-08 | Discharge: 2015-08-08 | Disposition: A | Discharge status: Home / Self Care | Payer: 59 | Source: Ambulatory Visit | Attending: Family Medicine | Admitting: Family Medicine

## 2015-08-08 ENCOUNTER — Encounter: Payer: Self-pay | Admitting: Family Medicine

## 2015-08-08 VITALS — BP 122/80 | HR 64 | Temp 98.2°F | Wt 175.8 lb

## 2015-08-08 DIAGNOSIS — J0140 Acute pansinusitis, unspecified: Secondary | ICD-10-CM | POA: Diagnosis not present

## 2015-08-08 DIAGNOSIS — R059 Cough, unspecified: Secondary | ICD-10-CM

## 2015-08-08 DIAGNOSIS — R05 Cough: Secondary | ICD-10-CM

## 2015-08-08 MED ORDER — AMOXICILLIN 875 MG PO TABS: 875.0000 mg | ORAL_TABLET | Freq: Two times a day (BID) | ORAL | Status: DC

## 2015-08-08 MED FILL — AMOXICILLIN 875 MG TABLET: 875 | 10 days supply | Qty: 20 | Fill #0

## 2015-08-08 NOTE — Patient Instructions (Signed)
Let me know if you're not back to normal after completing the antibiotic. Make sure you're staying well hydrated and you can treat your symptoms. Over-the-counter cough medication is fine. Let me know if you get worse. You might also want to try using a saline nasal spray for nasal congestion or sinus pain.  Cough, Adult Coughing is a reflex that clears your throat and your airways. Coughing helps to heal and protect your lungs. It is normal to cough occasionally, but a cough that happens with other symptoms or lasts a long time may be a sign of a condition that needs treatment. A cough may last only 2-3 weeks (acute), or it may last longer than 8 weeks (chronic). CAUSES Coughing is commonly caused by:  Breathing in substances that irritate your lungs.  A viral or bacterial respiratory infection.  Allergies.  Asthma.  Postnasal drip.  Smoking.  Acid backing up from the stomach into the esophagus (gastroesophageal reflux).  Certain medicines.  Chronic lung problems, including COPD (or rarely, lung cancer).  Other medical conditions such as heart failure. HOME CARE INSTRUCTIONS  Pay attention to any changes in your symptoms. Take these actions to help with your discomfort:  Take medicines only as told by your health care provider.  If you were prescribed an antibiotic medicine, take it as told by your health care provider. Do not stop taking the antibiotic even if you start to feel better.  Talk with your health care provider before you take a cough suppressant medicine.  Drink enough fluid to keep your urine clear or pale yellow.  If the air is dry, use a cold steam vaporizer or humidifier in your bedroom or your home to help loosen secretions.  Avoid anything that causes you to cough at work or at home.  If your cough is worse at night, try sleeping in a semi-upright position.  Avoid cigarette smoke. If you smoke, quit smoking. If you need help quitting, ask your health  care provider.  Avoid caffeine.  Avoid alcohol.  Rest as needed. SEEK MEDICAL CARE IF:   You have new symptoms.  You cough up pus.  Your cough does not get better after 2-3 weeks, or your cough gets worse.  You cannot control your cough with suppressant medicines and you are losing sleep.  You develop pain that is getting worse or pain that is not controlled with pain medicines.  You have a fever.  You have unexplained weight loss.  You have night sweats. SEEK IMMEDIATE MEDICAL CARE IF:  You cough up blood.  You have difficulty breathing.  Your heartbeat is very fast.   This information is not intended to replace advice given to you by your health care provider. Make sure you discuss any questions you have with your health care provider.   Document Released: 01/09/2011 Document Revised: 04/03/2015 Document Reviewed: 09/19/2014 Elsevier Interactive Patient Education [employer].

## 2015-08-08 NOTE — Progress Notes (Signed)
   Subjective:    Patient ID: [name], male    DOB: 09/22/1950, 65 y.o.   MRN: [id number]  HPI Chief Complaint  Patient presents with  . cough    cough for about 3 weeks, tired. otc mucinex, cold and flu meds  He is here with acute complaint of upper respiratory symptoms that began with runny nose, sneezing, drainage, and non productive cough for that has been persistent for past 2 1/2 weeks. Also reports sinus pressure, right ear popping and mild fatigue. Left sided chest "sore" with coughing.  Does not smoke, quit many years ago, denies asthma, or underlying allergies.  Denies fever, shortness of breath.  No recent antibiotic use Taking Mucinex D, and tylenol cold and flu  Review of Systems Pertinent positives and negatives in the history of present illness.    Objective:   Physical Exam BP 122/80 mmHg  Pulse 64  Temp(Src) 98.2 F (36.8 C) (Oral)  Wt 175 lb 12.8 oz (79.742 kg) Alert and in no distress. Sinus tenderness over the right maxillary and ethmoid.  Right nare with edema and thick drainage. Left near is red. Right tympanic membrane with fluid noted, no bulging, left tympanic membrane normal and canals are normal. Pharyngeal area is mildly erythematous. Neck is supple without adenopathy or thyromegaly. Cardiac exam shows a regular sinus rhythm without murmurs or gallops. Lung exam reveals diminished breath sounds to left lower lobe, all other lungs fields are clear to auscultation. Left lower chest wall tender.       Assessment & Plan:  Cough - Plan: DG Chest 2 View, amoxicillin (AMOXIL) 875 MG tablet  Acute pansinusitis, recurrence not specified - Plan: amoxicillin (AMOXIL) 875 MG tablet  Suspect he has acute sinusitis and drainage may be precipitating cough. Will treat with antibiotic get chest XR due to decreased breath sounds as noted. He will let me know if not back to normal after completing the antibiotic. Will follow up per XR result. Recommend staying well  hydrated and symptomatic treatment such as continuing Mucinex or decongestant.

## 2015-08-09 ENCOUNTER — Telehealth: Payer: Self-pay | Admitting: Internal Medicine

## 2015-08-09 DIAGNOSIS — R059 Cough, unspecified: Secondary | ICD-10-CM

## 2015-08-09 DIAGNOSIS — R05 Cough: Secondary | ICD-10-CM

## 2015-08-09 NOTE — Telephone Encounter (Signed)
orderd chest xray for pt to go get this in about a month and then see vickie to discuss xray

## 2015-08-15 ENCOUNTER — Other Ambulatory Visit: Payer: Self-pay | Admitting: Cardiology

## 2015-08-15 MED FILL — VERAPAMIL ER 240 MG TABLET: 240 | 90 days supply | Qty: 90 | Fill #0

## 2015-08-15 NOTE — Telephone Encounter (Signed)
Rx request sent to pharmacy.  

## 2015-08-27 ENCOUNTER — Telehealth: Payer: Self-pay | Admitting: Neurology

## 2015-08-27 NOTE — Telephone Encounter (Signed)
Patient returned Katrina's call, he got the message, requests a call back to speak with Katrina [phone].

## 2015-08-27 NOTE — Telephone Encounter (Signed)
Pt called said he trying to get life insurance and needs a letter stating what his dx is. Please fax to [phone] (pt's fax#)  pt is also asking if he can be called with dx and then the letter could follow at a later time bc he needs this information today or tomorrow.

## 2015-08-27 NOTE — Telephone Encounter (Signed)
If patient calls back if he wants it address to his insurance company he has to come in and sign a release letter. If he wants a general letter saying to whom it may concern with his diagnose, he does not need a diagnose letter. I left message explaining this.

## 2015-08-27 NOTE — Telephone Encounter (Signed)
Rn call patient back to give him the diagnose which is tremor and the ICD. Pt stated he can put the diagnose on the life insurance.

## 2015-08-28 ENCOUNTER — Other Ambulatory Visit: Payer: Self-pay | Admitting: Physician Assistant

## 2015-08-28 ENCOUNTER — Other Ambulatory Visit: Payer: Self-pay

## 2015-08-28 ENCOUNTER — Other Ambulatory Visit: Payer: Self-pay | Admitting: Neurology

## 2015-08-28 DIAGNOSIS — K7469 Other cirrhosis of liver: Secondary | ICD-10-CM | POA: Diagnosis not present

## 2015-08-28 DIAGNOSIS — K7460 Unspecified cirrhosis of liver: Secondary | ICD-10-CM | POA: Diagnosis not present

## 2015-08-28 MED ORDER — CARBIDOPA-LEVODOPA 25-100 MG PO TABS: ORAL_TABLET | ORAL | Status: DC

## 2015-08-28 MED FILL — CARBIDOPA-LEVODOPA 25-100 T: 25-100 | 90 days supply | Qty: 405 | Fill #0

## 2015-08-30 ENCOUNTER — Encounter: Payer: Self-pay | Admitting: Family Medicine

## 2015-08-30 ENCOUNTER — Ambulatory Visit
Admission: RE | Admit: 2015-08-30 | Discharge: 2015-08-30 | Disposition: A | Discharge status: Home / Self Care | Payer: 59 | Source: Ambulatory Visit | Attending: Family Medicine | Admitting: Family Medicine

## 2015-08-30 ENCOUNTER — Ambulatory Visit (INDEPENDENT_AMBULATORY_CARE_PROVIDER_SITE_OTHER): Payer: 59 | Admitting: Family Medicine

## 2015-08-30 VITALS — BP 122/80 | HR 61 | Wt 176.0 lb

## 2015-08-30 DIAGNOSIS — M25512 Pain in left shoulder: Secondary | ICD-10-CM

## 2015-08-30 DIAGNOSIS — S4992XA Unspecified injury of left shoulder and upper arm, initial encounter: Secondary | ICD-10-CM | POA: Diagnosis not present

## 2015-08-30 MED ORDER — HYDROCODONE-ACETAMINOPHEN 7.5-325 MG PO TABS: 1.0000 | ORAL_TABLET | Freq: Four times a day (QID) | ORAL | Status: DC | PRN

## 2015-08-30 MED FILL — HYDROCODON-APAP 7.5-325: 7.5-325 | 8 days supply | Qty: 30 | Fill #0

## 2015-08-30 NOTE — Progress Notes (Signed)
   Subjective:    Patient ID: [name], male    DOB: 1951-01-29, 65 y.o.   MRN: [id number]  HPI He fell 2 days ago on the steps at his house. He sustained a left shoulder injury. He did not fall on the shoulder but landed on his hands sustaining pain in the left shoulder.   Review of Systems     Objective:   Physical Exam Pain on motion of the shoulder. No point tenderness is noted. Slight laxity is noted posteriorly. No laxity anterior. Negative sulcus sign. X-ray shows degenerative changes.       Assessment & Plan:  Pain in joint of left shoulder - Plan: DG Shoulder Left, HYDROcodone-acetaminophen (NORCO) 7.5-325 MG tablet We will work on pain relief first with codeine and a sling. Discussed the fact that hopefully down the road we can start rehabilitation and keep him from getting a frozen shoulder.

## 2015-09-02 ENCOUNTER — Telehealth: Payer: Self-pay | Admitting: Family Medicine

## 2015-09-02 NOTE — Telephone Encounter (Signed)
Pt was notified that he can go to [facility] to get chest xray done but Dr. Redmond School hasn't looked at the other part of the message so someone else would get back to him later on

## 2015-09-02 NOTE — Telephone Encounter (Signed)
Order for chest XR has been ordered with [facility]. Thanks.

## 2015-09-02 NOTE — Telephone Encounter (Signed)
Pt called:  1  To let Dr. Redmond School know that his pain is a little better however depending on how he is sitting or laying his arms and hands will tingle as if they are sleep and go numb.Those positions where that will happens are if he is laying on his back and hands are placed on his stomach & if he is sitting in his recliner and arms are in a upward position.  2  To request an order for a follow up chest xray from [facility]. Pt says he was asked by Vickie to have a follow up chest xray around this time and then come back to see her to follow up on his cough. Pt will be going to [facility] on 2/13 for another test and would like to get the xray at that time.

## 2015-09-04 DIAGNOSIS — H34832 Tributary (branch) retinal vein occlusion, left eye, with macular edema: Secondary | ICD-10-CM | POA: Diagnosis not present

## 2015-09-05 DIAGNOSIS — S0502XA Injury of conjunctiva and corneal abrasion without foreign body, left eye, initial encounter: Secondary | ICD-10-CM | POA: Diagnosis not present

## 2015-09-09 ENCOUNTER — Ambulatory Visit
Admission: RE | Admit: 2015-09-09 | Discharge: 2015-09-09 | Disposition: A | Discharge status: Home / Self Care | Payer: 59 | Source: Ambulatory Visit | Attending: Family Medicine | Admitting: Family Medicine

## 2015-09-09 ENCOUNTER — Telehealth: Payer: Self-pay | Admitting: Internal Medicine

## 2015-09-09 ENCOUNTER — Ambulatory Visit
Admission: RE | Admit: 2015-09-09 | Discharge: 2015-09-09 | Disposition: A | Discharge status: Home / Self Care | Payer: 59 | Source: Ambulatory Visit | Attending: Physician Assistant | Admitting: Physician Assistant

## 2015-09-09 ENCOUNTER — Telehealth: Payer: Self-pay | Admitting: Family Medicine

## 2015-09-09 DIAGNOSIS — R059 Cough, unspecified: Secondary | ICD-10-CM

## 2015-09-09 DIAGNOSIS — R911 Solitary pulmonary nodule: Secondary | ICD-10-CM

## 2015-09-09 DIAGNOSIS — R05 Cough: Secondary | ICD-10-CM

## 2015-09-09 DIAGNOSIS — Z8719 Personal history of other diseases of the digestive system: Secondary | ICD-10-CM | POA: Diagnosis not present

## 2015-09-09 DIAGNOSIS — K7469 Other cirrhosis of liver: Secondary | ICD-10-CM

## 2015-09-09 NOTE — Telephone Encounter (Signed)
-----   Message from Girtha Rm, NP sent at 09/09/2015  1:40 PM EST ----- Please call and let him know that radiologist recommends a CT of his chest. They saw a small nodule on his XR in January and it is still there We can get a better picture of what this is with a CT. Please order this. Thank you.

## 2015-09-09 NOTE — Telephone Encounter (Signed)
Please call Patient would like your opinion on additional testing that [name] has ordered

## 2015-09-11 ENCOUNTER — Telehealth: Payer: Self-pay | Admitting: Family Medicine

## 2015-09-11 NOTE — Telephone Encounter (Signed)
Pt was notified of CT appt Friday 09/13/15 at [facility]

## 2015-09-11 NOTE — Telephone Encounter (Signed)
Called pt and told informed him of the information, pt said he would call back in a couple of days if the pain didn't get any better to see what he should do next.

## 2015-09-11 NOTE — Telephone Encounter (Signed)
Looking back, Dr. Redmond School gave him strong pain medication recently.  What did Dr. Redmond School tell him next step would be if worse?  At this point, we may have him take Aleve on top of Hydrocodone and f/u with Dr. Redmond School as he may want to do injection or other, not sure.    I can't given him something stronger than hydrocodone without exam or touching base with Dr. Redmond School.

## 2015-09-11 NOTE — Telephone Encounter (Signed)
Pt called and states that he is still in a lot of left shoulder pain, says it is worse at night, says he has been wearing the sling some the past 4 days, he has not been taking the pain medicine to often, he wants to know what he needs to do next, pt uses [place], [place]. And can be reached at [phone] (M), i told him Dr Redmond School was not in the office today, please advise

## 2015-09-12 ENCOUNTER — Telehealth: Payer: Self-pay | Admitting: Family Medicine

## 2015-09-12 NOTE — Telephone Encounter (Signed)
He is still having shoulder pain and this is interfering with sleep. He will given through the weekend but if continued difficulty, I will refer to orthopedics as we must consider a labral injury

## 2015-09-12 NOTE — Telephone Encounter (Signed)
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, this was discussed with [place].

## 2015-09-13 ENCOUNTER — Other Ambulatory Visit: Payer: Self-pay | Admitting: Family Medicine

## 2015-09-13 ENCOUNTER — Ambulatory Visit (INDEPENDENT_AMBULATORY_CARE_PROVIDER_SITE_OTHER): Payer: 59 | Admitting: Family Medicine

## 2015-09-13 ENCOUNTER — Encounter: Payer: Self-pay | Admitting: Family Medicine

## 2015-09-13 ENCOUNTER — Ambulatory Visit
Admission: RE | Admit: 2015-09-13 | Discharge: 2015-09-13 | Disposition: A | Discharge status: Home / Self Care | Payer: 59 | Source: Ambulatory Visit | Attending: Family Medicine | Admitting: Family Medicine

## 2015-09-13 VITALS — BP 118/80 | HR 74 | Ht 69.0 in | Wt 176.0 lb

## 2015-09-13 DIAGNOSIS — M25512 Pain in left shoulder: Secondary | ICD-10-CM

## 2015-09-13 DIAGNOSIS — R918 Other nonspecific abnormal finding of lung field: Secondary | ICD-10-CM | POA: Diagnosis not present

## 2015-09-13 DIAGNOSIS — R911 Solitary pulmonary nodule: Secondary | ICD-10-CM

## 2015-09-13 DIAGNOSIS — R9389 Abnormal findings on diagnostic imaging of other specified body structures: Secondary | ICD-10-CM

## 2015-09-13 MED ORDER — HYDROCODONE-ACETAMINOPHEN 7.5-325 MG PO TABS: 1.0000 | ORAL_TABLET | Freq: Four times a day (QID) | ORAL | Status: DC | PRN

## 2015-09-13 MED ORDER — IOPAMIDOL (ISOVUE-300) INJECTION 61%
75.0000 mL | Freq: Once | INTRAVENOUS | Status: AC | PRN
  Administered 2015-09-13: 75 mL via INTRAVENOUS

## 2015-09-13 MED FILL — HYDROCODON-APAP 7.5-325: 7.5-325 | 5 days supply | Qty: 30 | Fill #0

## 2015-09-13 NOTE — Progress Notes (Signed)
   Subjective:    Patient ID: [address]ES [address], male    DOB: [birth year], 65 y.o.   MRN: [id number]  HPI He is here for recheck on his left shoulder. He fell yesterday while working however he works for himself. He apparently landed on the left shoulder and experienced more pain.   Review of Systems     Objective:   Physical Exam Alert and looking quite uncomfortable. Pain on motion of the shoulder. No laxity noted. Difficult to do a full exam due to the pain that he is under.       Assessment & Plan:  Pain in joint of left shoulder - Plan: HYDROcodone-acetaminophen (NORCO) 7.5-325 MG tablet I will refer to Dr. Paulla Fore for further evaluation and possible ultrasound to further assess the damage. There could possibly be a labral involvement since my previous exam did show some slight laxity.

## 2015-09-13 NOTE — Progress Notes (Signed)
The chest ct was discussed with the patient. Recommend six-month follow-up which he  is in agreement with.

## 2015-09-17 DIAGNOSIS — M25512 Pain in left shoulder: Secondary | ICD-10-CM | POA: Diagnosis not present

## 2015-09-17 MED FILL — CELECOXIB 200 MG CAPSULE: 200 | 30 days supply | Qty: 30 | Fill #0

## 2015-09-18 DIAGNOSIS — H26492 Other secondary cataract, left eye: Secondary | ICD-10-CM | POA: Diagnosis not present

## 2015-09-30 MED FILL — IRBESARTAN 150 MG TABLET: 150 | 90 days supply | Qty: 90 | Fill #1

## 2015-10-07 ENCOUNTER — Ambulatory Visit (INDEPENDENT_AMBULATORY_CARE_PROVIDER_SITE_OTHER): Payer: 59 | Admitting: Family Medicine

## 2015-10-07 ENCOUNTER — Encounter: Payer: Self-pay | Admitting: Family Medicine

## 2015-10-07 VITALS — BP 110/82 | HR 78 | Ht 69.0 in | Wt 172.0 lb

## 2015-10-07 DIAGNOSIS — F329 Major depressive disorder, single episode, unspecified: Secondary | ICD-10-CM

## 2015-10-07 DIAGNOSIS — F32A Depression, unspecified: Secondary | ICD-10-CM

## 2015-10-07 MED ORDER — CITALOPRAM HYDROBROMIDE 20 MG PO TABS: 20.0000 mg | ORAL_TABLET | Freq: Every day | ORAL | Status: DC

## 2015-10-07 MED FILL — CITALOPRAM HBR 20 MG TABLET: 20 | 30 days supply | Qty: 30 | Fill #0

## 2015-10-07 NOTE — Progress Notes (Signed)
   Subjective:    Patient ID: [name], male    DOB: [birth year], 65 y.o.   MRN: [id number]  HPI He is here for evaluation of a year-long history of increasing difficulty with decreased concentration, fatigue, feeling sad, being self-critical and frustrated. He is also had sleep issues, falling asleep easily but not being able to stay asleep. He has not been able to associate this with anything although he has had a rough several years dealing with eye issues as well as a recent fall and reinjury to the shoulder after that fall. Wife is also noted this and did recommend evaluating evaluated.   Review of Systems     Objective:   Physical Exam Alert and in no distress with a flat affect and appropriately dressed. PH Q9 16       Assessment & Plan:  Depression - Plan: citalopram (CELEXA) 20 MG tablet I will place him on Celexa. Also discussed counseling and at this time he would like to just try medications. I explained all of these problems could be related to all the physical palms he's had over the last couple of years. Recheck here in one month.

## 2015-10-09 ENCOUNTER — Telehealth: Payer: Self-pay | Admitting: Family Medicine

## 2015-10-09 DIAGNOSIS — H35372 Puckering of macula, left eye: Secondary | ICD-10-CM | POA: Diagnosis not present

## 2015-10-09 DIAGNOSIS — H34832 Tributary (branch) retinal vein occlusion, left eye, with macular edema: Secondary | ICD-10-CM | POA: Diagnosis not present

## 2015-10-09 DIAGNOSIS — H35352 Cystoid macular degeneration, left eye: Secondary | ICD-10-CM | POA: Diagnosis not present

## 2015-10-09 DIAGNOSIS — H34812 Central retinal vein occlusion, left eye, with macular edema: Secondary | ICD-10-CM | POA: Diagnosis not present

## 2015-10-09 NOTE — Telephone Encounter (Signed)
Pt called and states that the celexa is making him sleepy he's not for sure what to do if he should continue taking it or stop, , i told him you was out of the office today and it would be tomorrow. Please advise, pt can be reached at [phone] (M)

## 2015-10-11 NOTE — Telephone Encounter (Signed)
Multiple counts were made to reach him. Left a message for him to call me Monday

## 2015-10-14 MED FILL — LIVALO 1 MG TABLET: 1 | 84 days supply | Qty: 24 | Fill #1

## 2015-10-15 MED FILL — TIMOLOL 0.5% EYE DROPS: 0.5 | 30 days supply | Qty: 5 | Fill #2

## 2015-10-16 DIAGNOSIS — M25512 Pain in left shoulder: Secondary | ICD-10-CM | POA: Diagnosis not present

## 2015-10-16 DIAGNOSIS — M75112 Incomplete rotator cuff tear or rupture of left shoulder, not specified as traumatic: Secondary | ICD-10-CM | POA: Diagnosis not present

## 2015-10-16 MED FILL — NITROGLYCERIN 0.2 MG/HR PTC: 0.2 | 30 days supply | Qty: 8 | Fill #0

## 2015-10-23 MED FILL — NITROGLYCERIN 0.4 MG TAB SL: 0.4 | 15 days supply | Qty: 25 | Fill #1

## 2015-10-26 HISTORY — PX: NM MYOVIEW LTD: HXRAD82

## 2015-10-28 ENCOUNTER — Telehealth: Payer: Self-pay | Admitting: Family Medicine

## 2015-10-28 DIAGNOSIS — M25512 Pain in left shoulder: Secondary | ICD-10-CM | POA: Diagnosis not present

## 2015-10-28 MED FILL — ALFUZOSIN HCL ER 10 MG TAB: 10 | 90 days supply | Qty: 90 | Fill #2

## 2015-10-28 MED FILL — HYDROCHLOROTHIAZIDE 12.5 MG: 12.5 | 90 days supply | Qty: 180 | Fill #1

## 2015-10-28 NOTE — Telephone Encounter (Signed)
Rcvd message from answering service stating that pt wanted a call back regarding meds. Called pt to find out more details. Left message to call us back

## 2015-10-30 ENCOUNTER — Telehealth: Payer: Self-pay | Admitting: Family Medicine

## 2015-10-30 DIAGNOSIS — M25512 Pain in left shoulder: Secondary | ICD-10-CM | POA: Diagnosis not present

## 2015-10-30 NOTE — Telephone Encounter (Signed)
Have him stop this. There is some data on taking St. John's wort. Have him make an appointment with me whenever is convenient for his schedule.

## 2015-10-30 NOTE — Telephone Encounter (Signed)
LMTCB

## 2015-10-30 NOTE — Telephone Encounter (Signed)
Pt called stating that Celexa is causing him to tremor and be jumpy if someone speak to him. He is taking 1/2 pill a day over the past week and 1/2. Pt wants to know if there is a herb that he can take in place of this med

## 2015-10-31 NOTE — Telephone Encounter (Signed)
Left message for patient to return my call.

## 2015-11-01 NOTE — Telephone Encounter (Signed)
Left message for patient to call office regarding his issues.

## 2015-11-04 ENCOUNTER — Telehealth: Payer: Self-pay | Admitting: Family Medicine

## 2015-11-04 NOTE — Telephone Encounter (Signed)
Please call patient  Re his meds,  Celexa-  causing him to shake and nerves to be on edge, jumpy  He has been cutting down on it.  Hetaking 10 mg every other day for the last 2 days   Be sure to use cell # listed in message

## 2015-11-06 ENCOUNTER — Encounter: Payer: Self-pay | Admitting: Family Medicine

## 2015-11-06 ENCOUNTER — Ambulatory Visit (INDEPENDENT_AMBULATORY_CARE_PROVIDER_SITE_OTHER): Payer: 59 | Admitting: Family Medicine

## 2015-11-06 VITALS — BP 112/60 | HR 76 | Ht 69.0 in | Wt 170.0 lb

## 2015-11-06 DIAGNOSIS — F32A Depression, unspecified: Secondary | ICD-10-CM

## 2015-11-06 DIAGNOSIS — F329 Major depressive disorder, single episode, unspecified: Secondary | ICD-10-CM

## 2015-11-06 DIAGNOSIS — M25512 Pain in left shoulder: Secondary | ICD-10-CM | POA: Diagnosis not present

## 2015-11-06 NOTE — Progress Notes (Signed)
   Subjective:    Patient ID: [name], male    DOB: 1951-03-11, 65 y.o.   MRN: [id number]  HPI He is here for recheck. He did have unacceptable side effects from Celexa causing nervousness and anxiety. He stopped this medication. Since then he has started walking and plans to also start a cycling program. He does state that he is feeling slightly better. He has not gotten involved in counseling.   Review of Systems     Objective:   Physical Exam Alert and in no distress with a slightly flat affect       Assessment & Plan:  Depression I discussed options with him. He would like to try something as a supplement rather than another medication. Recommend he try St. Duy Lemming's wort. Also referred him for counseling to [facility].he is to call me in 1 month to let me know how he is doing. We might reconsider starting him on a psychotropic at that time.

## 2015-11-06 NOTE — Patient Instructions (Signed)
Darryl Hyers [id number] Try [place]. [place] Attig's wort

## 2015-11-07 ENCOUNTER — Telehealth: Payer: Self-pay | Admitting: Cardiology

## 2015-11-07 NOTE — Telephone Encounter (Signed)
Pt had fluttering when he woke up, now feels tender-denies any other symptoms- pls call [phone]

## 2015-11-07 NOTE — Telephone Encounter (Signed)
Returned pt call. Pt stated that he felt his heart beating fast for a few moments as he was coming out of sleep this morning. It went away quickly and he got up and has been doing his usual routine with no problem.  His only complaint now is a soreness over his left chest. He said he was in PT yesterday for his left shoulder where he was doing stretches, weights and resistance bands with his left shoulder.   He denies chest pain, SOB, dizziness, or palpitations at this time.  Advised pt to continue monitoring and if he develops new or worsening symptoms to call us back or go to the ER.  Will route to Dr Ellyn Hack.

## 2015-11-07 NOTE — Telephone Encounter (Signed)
I suspect there is discomfort in the chest is probably related to physical therapy. Donald to make of the rapid heartbeat episode. If these happen frequently, we can potentially capture an abnormal Rhythm with a monitor.  [name], MD

## 2015-11-08 ENCOUNTER — Telehealth: Payer: Self-pay | Admitting: Cardiology

## 2015-11-08 NOTE — Telephone Encounter (Signed)
Called pt back to address a new phone call from today 4/14 (New phone call still open). Gave pt Dr Allison Quarry evaluation as noted during new phone call. Pt verbalized understanding.

## 2015-11-08 NOTE — Telephone Encounter (Signed)
Called pt back and he answered. Advised him to take his HCTZ 12.5 mg by mouth once a day for a week to see if that helped.  Advised pt to call if he had any new or worsening s/s and to seek immediately medical attention if needed. Pt verbalized understanding.

## 2015-11-08 NOTE — Telephone Encounter (Signed)
Called pt back. Pt stated he was going to check in for his PT appt and he suddenly felt faint and lightheaded. He did not stay and told the receptionist he had to leave. He is not able to take his BP at this time but could later.  Stated he really does not feel SOB or palpitations. He has not had any more episodes of rapid heart beat since the one early yesterday morning.  He feels he has "low energy" lately. Denies doing any major physical activity other than PT two days ago. Chest is less tender than yesterday.  Recommended the pt to rest as needed. Advised pt to call us if new or worsening s/s.   Will route to Dr. Ellyn Hack.

## 2015-11-08 NOTE — Telephone Encounter (Signed)
Pt lightheaded, some SOB with exertion, pt called yesterday and was told to call today if not better pls call [phone]

## 2015-11-08 NOTE — Telephone Encounter (Signed)
Lest cut HCTZ dose to 12.5 mg daily for a week or so.  [name], MD

## 2015-11-11 ENCOUNTER — Telehealth: Payer: Self-pay | Admitting: Cardiology

## 2015-11-11 NOTE — Telephone Encounter (Signed)
Pt calling back c./o  over weekend pressure in chest-took Nitro 4 x yesterday and 2 or 3 the day before, feels better today-pls call [phone]

## 2015-11-11 NOTE — Telephone Encounter (Signed)
Phone goes straight to VM. Pt's return call will need to be routed to triage RN

## 2015-11-11 NOTE — Telephone Encounter (Signed)
Follow up   ° ° °Patient calling back to speak with triage nurse  °

## 2015-11-11 NOTE — Telephone Encounter (Signed)
Phone goes to VM. Left msg for patient to return call to triage RN

## 2015-11-11 NOTE — Telephone Encounter (Signed)
SPOKE TO PATIENT  APPOINTMENT SCHEDULE FOR 11/14/15 WITH DR HARDING. NO SYMPTOMS TODAY. IF CHEST PAIN REQUIRING 3 NITRO WITH AN EPISODE CALL 911 PATIENT VERBALIZED UNDERSTANDING.

## 2015-11-13 DIAGNOSIS — H34812 Central retinal vein occlusion, left eye, with macular edema: Secondary | ICD-10-CM | POA: Diagnosis not present

## 2015-11-13 DIAGNOSIS — M25512 Pain in left shoulder: Secondary | ICD-10-CM | POA: Diagnosis not present

## 2015-11-14 ENCOUNTER — Ambulatory Visit (INDEPENDENT_AMBULATORY_CARE_PROVIDER_SITE_OTHER): Payer: 59 | Admitting: Cardiology

## 2015-11-14 ENCOUNTER — Encounter: Payer: Self-pay | Admitting: Cardiology

## 2015-11-14 VITALS — BP 102/76 | HR 72 | Ht 70.0 in | Wt 170.0 lb

## 2015-11-14 DIAGNOSIS — R0789 Other chest pain: Secondary | ICD-10-CM | POA: Diagnosis not present

## 2015-11-14 DIAGNOSIS — R079 Chest pain, unspecified: Secondary | ICD-10-CM

## 2015-11-14 DIAGNOSIS — E785 Hyperlipidemia, unspecified: Secondary | ICD-10-CM

## 2015-11-14 DIAGNOSIS — I10 Essential (primary) hypertension: Secondary | ICD-10-CM

## 2015-11-14 MED ORDER — ISOSORBIDE MONONITRATE ER 30 MG PO TB24: 30.0000 mg | ORAL_TABLET | Freq: Every day | ORAL | Status: DC

## 2015-11-14 MED FILL — ISOSORBIDE MN ER 30 MG TAB: 30 | 30 days supply | Qty: 30 | Fill #0

## 2015-11-14 NOTE — Assessment & Plan Note (Signed)
These symptoms he is having are starting SL little bit more concerning for angina and maybe potentially moderate risk. He does have high blood pressure, and dyslipidemia and therefore has risk factors. Not had a stress test in several years. This point I think is probably best to evaluate with a Myoview stress test to exclude ischemia. I will then see him back in a few weeks.  In the off chance that this could be related to coronary spasm or esophageal spasm, I will have him start Imdur 30 mg daily. In lieu of this he will stop his HCTZ.

## 2015-11-14 NOTE — Assessment & Plan Note (Signed)
Blood pressure looks good now on current dose of verapamil and Avapro. I think were okay stopping HCTZ as we are starting Imdur.

## 2015-11-14 NOTE — Patient Instructions (Signed)
Your physician wants you to follow-up in: 3 Months. You will receive a reminder letter in the mail two months in advance. If you don't receive a letter, please call our office to schedule the follow-up appointment.  Your physician has recommended you make the following change in your medication: START Imdur 30 mg daily, STOP HCTZ  Your physician has requested that you have en exercise stress myoview. For further information please visit [email or web address]. Please follow instruction sheet, as given.

## 2015-11-14 NOTE — Progress Notes (Signed)
PCP: Jesus Haste, MD  Clinic Note: Chief Complaint  Patient presents with  . Follow-up    INCREASED CHEST DISCOMFORT  . Shortness of Breath  . Dizziness    HPI: [name] is a 65 y.o. male with a PMH below who presents today for evaluation of CP - requiring NTG. Has no documented CAD. He is a former patient of Dr. Myrtice Maxwell then he saw Dr. Rollene Maxwell until he retired.  He is being followed for cardiac risk factors of hypertension, hyperlipidemia and had a catheterization performed in 2005 for chest discomfort which only showed minor coronary disease. He also negative Myoview in 2011. He was last seen by Dr. Rollene Maxwell in June 2014 following a trip to [place] with his wife & I first saw him in December 2014. He was doing very well with no major complaints. I felt he was stable for 1 year followup. He saw [name], [place] back in March with complaints of feeling sluggish and poor energy. He also felt like he was having some memory issues. At that time his ACE inhibitor dose was reduced. Was converted from lisinopril to Avapro because of cough.  [name] was last seen in Dec 2016.He was doing pretty well with no major complaints. Had some intermittent chest discomfort that usually while in bed, sitting or relaxing. GI evaluation was performed and suggested that could be esophageal spasm. EGD was unremarkable.  Recent Hospitalizations: n/a  Studies Reviewed: n/a  Interval History: [name] presents today again with increased episodes of chest discomfort. These episodes now are lasting a little bit longer and becoming more intense. He said about or so ago he started having some lightheadedness and dizziness and he called [place] and we decrease his HCTZ dose and is not had a little bit better. However since that started he's been started note now some lies chest pressure. At that time he's had at least 4 episodes can worse over Saturday and Sunday. He started noticing a  little dyspnea associated with the weekend. He also started noticing that it was maybe potentially getting worse with exertion. He had one episode that occurred last night but it aborted quickly and didn't last more than about 30 seconds. The typical episodes begin is almost the arms and shoulders and then reverted it radiated into the chest with tightness and pressure when it starts off as a squeezing sensation in his shoulder. These episodes usually last a few minutes. They have usually been occurring at rest, but he thinks they do get worse with exertion. He says he is not really having any significant difficulty with breathing and does note that is worse with deep inspiration.  No PND, orthopnea or edema.  No palpitations, lightheadedness, dizziness, weakness or syncope/near syncope. No TIA/amaurosis fugax symptoms. No melena, hematochezia, hematuria, or epstaxis. No claudication.  ROS: A comprehensive was performed. Review of Systems  Constitutional: Negative for malaise/fatigue and diaphoresis.  HENT: Negative for nosebleeds.   Eyes: Negative for blurred vision.  Respiratory: Positive for shortness of breath (Not really associated with current symptoms). Negative for cough and wheezing.   Cardiovascular: Positive for chest pain and palpitations. Negative for claudication.  Gastrointestinal: Positive for heartburn. Negative for abdominal pain, diarrhea, constipation, blood in stool and melena.  Musculoskeletal: Negative for myalgias, joint pain and falls.  Neurological: Positive for dizziness (Better now with reduced dose of HCTZ). Negative for headaches.  Endo/Heme/Allergies: Does not bruise/bleed easily.  Psychiatric/Behavioral: Negative for depression, hallucinations and memory loss. The  patient is not nervous/anxious and does not have insomnia.   All other systems reviewed and are negative.    Past Medical History  Diagnosis Date  . Hypertension   . Allergy   . PVD (peripheral  vascular disease) ([place])     With no claudication; only mild abdominal aortic atherosclerosis noted on ultrasound.  . Diverticulosis   . Hepatitis C     chronic  . HLD (hyperlipidemia)     statin intolerant (Crestor & Simvastatin) - Taking Livalo 1mg  / week  . Plantar fasciitis     right  . Head injury, closed, with concussion ([place])   . Ulcer   . Glaucoma   . Gout   . Complication of anesthesia     Pt. stated he had a reaction that ended in him requiring urinary cath placement    Past Surgical History  Procedure Laterality Date  . Tonsillectomy    . Cardiac catheterization  2005    30% Cx.   [name] Nm myoview ltd  2011    Neg Ischemia or infarct.  . Transthoracic echocardiogram  2013    Normal EF. No significant Valve Disease  . Colonoscopy    . Upper gastrointestinal endoscopy    . Eye surgery    . Pars plana vitrectomy Left 03/14/2014    Procedure: PARS PLANA VITRECTOMY WITH 25 GAUGE;  Surgeon: Jesus Horn, MD;  Location: [facility];  Service: Ophthalmology;  Laterality: Left;  [name] Membrane peel Left 03/14/2014    Procedure: MEMBRANE PEEL; ENDOLASER;  Surgeon: Jesus Horn, MD;  Location: [facility];  Service: Ophthalmology;  Laterality: Left;  . Cataract surgery Left 11/05/2014  . Esophagogastroduodenoscopy N/A 05/02/2015    Procedure: ESOPHAGOGASTRODUODENOSCOPY (EGD);  Surgeon: Jesus Lobo, MD;  Location: [facility];  Service: Endoscopy;  Laterality: N/A;  . Esophagogastroduodenoscopy  05/02/2015    no source of pt chest pain endoscopically evident. small hiatal hernia.   Prior to Admission medications   Medication Sig Start Date End Date Taking? Authorizing Provider  alfuzosin (UROXATRAL) 10 MG 24 hr tablet Take 10 mg by mouth daily with breakfast.   Yes Historical Provider, MD  aspirin EC 81 MG tablet Take 81 mg by mouth daily.   Yes Historical Provider, MD  calcium carbonate (OS-CAL) 600 MG TABS tablet Take 600 mg by mouth daily with breakfast.   Yes Historical Provider, MD    carbidopa-levodopa (SINEMET IR) 25-100 MG tablet TAKE 1 AND 1/2 TABLET BY MOUTH 3 TIMES DAILY. 08/28/15  Yes Jesus Fila, MD  hydrochlorothiazide (MICROZIDE) 12.5 MG capsule Take 1 capsule (12.5 mg total) by mouth 2 (two) times daily. Patient taking differently: Take 12.5 mg by mouth daily.  07/26/15  Yes [name], MD  irbesartan (AVAPRO) 150 MG tablet Take 1 tablet (150 mg total) by mouth daily. 07/02/15  Yes [name], MD  Multiple Vitamin (MULTIVITAMIN WITH MINERALS) TABS tablet Take 1 tablet by mouth daily.   Yes Historical Provider, MD  nitroGLYCERIN (NITROSTAT) 0.4 MG SL tablet Place 1 tablet (0.4 mg total) under the tongue every 5 (five) minutes as needed for chest pain. 07/02/15  Yes [name], MD  pantoprazole (PROTONIX) 40 MG tablet Take 40 mg by mouth 2 (two) times daily. 06/17/15  Yes Historical Provider, MD  Pitavastatin Calcium 1 MG TABS Take 1 tablet (1 mg total) by mouth 2 (two) times a week. 07/02/15  Yes [name], MD  timolol (TIMOPTIC) 0.5 % ophthalmic solution Place 1  drop into both eyes daily.   Yes Historical Provider, MD  verapamil (CALAN-SR) 240 MG CR tablet TAKE 1 TABLET BY MOUTH DAILY. 08/15/15  Yes [name], MD   No Known Allergies   Social History   Social History  . Marital Status: Married    Spouse Name: barbra gen  . Number of Children: 3  . Years of Education: AS   Occupational History  . self employed    Social History Main Topics  . Smoking status: Former Smoker    Types: Cigars    Quit date: 07/27/1988  . Smokeless tobacco: Never Used  . Alcohol Use: 1.8 oz/week    0 Standard drinks or equivalent, 1 Glasses of wine, 1 Shots of liquor, 1 Cans of beer per week     Comment: occasional  . Drug Use: No  . Sexual Activity: Yes   Other Topics Concern  . 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     Wife is retired Therapist, sports from [employer].   He walks  roughly 2-3 miles in bed time at least 2-3 times a week. He quit smoking 30 years ago.   family history includes Cancer in his paternal grandfather and sister; Heart disease in his mother.   Wt Readings from Last 3 Encounters:  11/14/15 170 lb (77.111 kg)  11/06/15 170 lb (77.111 kg)  10/07/15 172 lb (78.019 kg)    PHYSICAL EXAM BP 102/76 mmHg  Pulse 72  Ht 5\' 10"  (1.778 m)  Wt 170 lb (77.111 kg)  BMI 24.39 kg/m2 General appearance: alert and oriented x3, cooperative, appears stated age, no distress and Well-nourished/well groomed. Normal mood and affect. Answers questions appropriately. Neck: no adenopathy, no carotid bruit, no JVD, supple, symmetrical, trachea midline and thyroid not enlarged, symmetric, no tenderness/mass/nodules Lungs: clear to auscultation bilaterally, normal percussion bilaterally and Nonlabored, good air movement Heart: normal apical impulse, S1, S2 normal, no S3 or S4 and No R./G. 1/6 SEM at the base Abdomen: soft, non-tender; bowel sounds normal; no masses, no organomegaly Extremities: extremities normal, atraumatic, no cyanosis or edema;  Pulses: 2+ and symmetric Skin: Skin color, texture, turgor normal. No rashes or lesion; 2 tone and color change in the face the scalp. Neurologic: Grossly normal    Adult ECG Report  Rate: 72  ;  Rhythm: normal sinus rhythm and Baseline artifact. Cannot rule out anteroseptal MI, age undetermined.;   Narrative Interpretation: Stable EKG with no changes. Normal voltage, durations and intervals   Other studies Reviewed: Additional studies/ records that were reviewed today include:  Recent Labs:   Lab Results  Component Value Date   CHOL 150 07/02/2015   HDL 50 07/02/2015   LDLCALC 93 07/02/2015   TRIG 37 07/02/2015   CHOLHDL 3.0 07/02/2015    ASSESSMENT / PLAN: Problem List Items Addressed This Visit    Moderate essential hypertension (Chronic)    Blood pressure looks good now on current dose of verapamil and  Avapro. I think were okay stopping HCTZ as we are starting Imdur.      Relevant Medications   isosorbide mononitrate (IMDUR) 30 MG 24 hr tablet   Hyperlipidemia with target LDL less than 100 (Chronic)    He seems to be doing relatively well with Livalo. LDL cholesterol is below 100, which in the absence of documented coronary disease would be targeted for him. Other lipids look good. No myalgias with Livalo.  Relevant Medications   isosorbide mononitrate (IMDUR) 30 MG 24 hr tablet   Chest pain with moderate risk for cardiac etiology (Chronic)    These symptoms he is having are starting SL little bit more concerning for angina and maybe potentially moderate risk. He does have high blood pressure, and dyslipidemia and therefore has risk factors. Not had a stress test in several years. This point I think is probably best to evaluate with a Myoview stress test to exclude ischemia. I will then see him back in a few weeks.  In the off chance that this could be related to coronary spasm or esophageal spasm, I will have him start Imdur 30 mg daily. In lieu of this he will stop his HCTZ.       Other Visit Diagnoses    Chest discomfort    -  Primary    Relevant Medications    isosorbide mononitrate (IMDUR) 30 MG 24 hr tablet    Other Relevant Orders    EKG 12-Lead    Myocardial Perfusion Imaging       Current medicines are reviewed at length with the patient today. (+/- concerns) none The following changes have been made:  START Imdur 30 mg daily, STOP HCTZ  Your physician has requested that you have en exercise stress myoview.  Studies Ordered:   Orders Placed This Encounter  Procedures  . Myocardial Perfusion Imaging  . EKG 12-Lead   Your physician wants you to follow-up in: 3 Months.      [name], M.D., M.S. Interventional Cardiologist   Pager # [phone] Phone # [phone] [address] [place], [place] [address]

## 2015-11-14 NOTE — Assessment & Plan Note (Signed)
He seems to be doing relatively well with Livalo. LDL cholesterol is below 100, which in the absence of documented coronary disease would be targeted for him. Other lipids look good. No myalgias with Livalo.

## 2015-11-20 ENCOUNTER — Telehealth (HOSPITAL_COMMUNITY): Payer: Self-pay

## 2015-11-20 DIAGNOSIS — M25512 Pain in left shoulder: Secondary | ICD-10-CM | POA: Diagnosis not present

## 2015-11-20 NOTE — Telephone Encounter (Signed)
Encounter complete. 

## 2015-11-22 ENCOUNTER — Ambulatory Visit (HOSPITAL_COMMUNITY)
Admission: RE | Admit: 2015-11-22 | Discharge: 2015-11-22 | Disposition: A | Discharge status: Home / Self Care | Payer: 59 | Source: Ambulatory Visit | Attending: Cardiovascular Disease | Admitting: Cardiovascular Disease

## 2015-11-22 DIAGNOSIS — R42 Dizziness and giddiness: Secondary | ICD-10-CM | POA: Insufficient documentation

## 2015-11-22 DIAGNOSIS — I739 Peripheral vascular disease, unspecified: Secondary | ICD-10-CM | POA: Diagnosis not present

## 2015-11-22 DIAGNOSIS — R0789 Other chest pain: Secondary | ICD-10-CM | POA: Insufficient documentation

## 2015-11-22 DIAGNOSIS — R0602 Shortness of breath: Secondary | ICD-10-CM | POA: Diagnosis not present

## 2015-11-22 DIAGNOSIS — R079 Chest pain, unspecified: Secondary | ICD-10-CM | POA: Diagnosis not present

## 2015-11-22 DIAGNOSIS — Z8249 Family history of ischemic heart disease and other diseases of the circulatory system: Secondary | ICD-10-CM | POA: Diagnosis not present

## 2015-11-22 DIAGNOSIS — I10 Essential (primary) hypertension: Secondary | ICD-10-CM | POA: Insufficient documentation

## 2015-11-22 DIAGNOSIS — Z87891 Personal history of nicotine dependence: Secondary | ICD-10-CM | POA: Diagnosis not present

## 2015-11-22 DIAGNOSIS — R5383 Other fatigue: Secondary | ICD-10-CM | POA: Insufficient documentation

## 2015-11-22 LAB — MYOCARDIAL PERFUSION IMAGING
CHL CUP NUCLEAR SSS: 7
CHL CUP RESTING HR STRESS: 83 {beats}/min
CSEPED: 7 min
CSEPPHR: 142 {beats}/min
Estimated workload: 7 METS
LVDIAVOL: 70 mL (ref 62–150)
LVSYSVOL: 22 mL
MPHR: 156 {beats}/min
NUC STRESS TID: 0.84
Percent HR: 91 %
RPE: 17
SDS: 2
SRS: 5

## 2015-11-22 MED ORDER — TECHNETIUM TC 99M SESTAMIBI GENERIC - CARDIOLITE
29.1000 | Freq: Once | INTRAVENOUS | Status: AC | PRN
  Administered 2015-11-22: 29.1 via INTRAVENOUS

## 2015-11-22 MED ORDER — TECHNETIUM TC 99M SESTAMIBI GENERIC - CARDIOLITE
10.9000 | Freq: Once | INTRAVENOUS | Status: AC | PRN
  Administered 2015-11-22: 10.9 via INTRAVENOUS

## 2015-11-22 MED FILL — VERAPAMIL ER 240 MG TABLET: 240 | 90 days supply | Qty: 90 | Fill #1

## 2015-11-26 ENCOUNTER — Telehealth: Payer: Self-pay | Admitting: Cardiology

## 2015-11-26 ENCOUNTER — Telehealth: Payer: Self-pay | Admitting: *Deleted

## 2015-11-26 NOTE — Telephone Encounter (Signed)
Follow Up   Pt called for Myocardial perfusion results. please call

## 2015-11-26 NOTE — Telephone Encounter (Signed)
-----   Message from [name], MD sent at 11/25/2015  7:58 AM EDT ----- Low Risk Stress Test.   Although the EKG was mildly abnormal - the images do not suggest evidence of "ischemia".    [name], MD

## 2015-11-26 NOTE — Telephone Encounter (Signed)
Spoke to patient. Result given . Verbalized understanding PATIENT WANTED TO KNOW  IF HE CAN TAKE PROTONIX AS NEEDED, SINCE TEST WAS CLEARED RN INFORMED PATIENT HE CAN TRY IF SYMPTOMS RETURN RESTART PROTONIX

## 2015-11-26 NOTE — Telephone Encounter (Signed)
PATIENT CALLED BACK TO STATES HE HAS HEADACHE AFTER TAKING THE ISOSORBIDE  PATIENT STATES HE HAS NOT HAD THE SYMPTOMS SINCE STARTING MEDICATION PATIENT HAD RECENT MYOVIEW- RESULTS GIVEN   RN INFORMED PATIENT TO TAKE ASPIRIN DOSE 30 MIN PRIOR TO ISOSORBIDE AND SWITCH AND TAKE AT NIGHT TIME.  WILL DEFER TO DR HARDING TO SEE IF ANYTHING ?   PATIENT VERBALIZED UNDERSTANDING.

## 2015-11-27 DIAGNOSIS — M75112 Incomplete rotator cuff tear or rupture of left shoulder, not specified as traumatic: Secondary | ICD-10-CM | POA: Diagnosis not present

## 2015-11-27 DIAGNOSIS — M25512 Pain in left shoulder: Secondary | ICD-10-CM | POA: Diagnosis not present

## 2015-11-27 NOTE — Telephone Encounter (Signed)
Agree with taking Imdur after ASA.  See how that does.   HA should improve in time.  [name], MD

## 2015-11-27 NOTE — Telephone Encounter (Signed)
LEFT MESSAGE TO CALL BACK

## 2015-11-27 NOTE — Telephone Encounter (Signed)
Patient aware--switching to taking at night--verbalized understanding

## 2015-12-04 DIAGNOSIS — Z125 Encounter for screening for malignant neoplasm of prostate: Secondary | ICD-10-CM | POA: Diagnosis not present

## 2015-12-04 DIAGNOSIS — R351 Nocturia: Secondary | ICD-10-CM | POA: Diagnosis not present

## 2015-12-04 DIAGNOSIS — N401 Enlarged prostate with lower urinary tract symptoms: Secondary | ICD-10-CM | POA: Diagnosis not present

## 2015-12-04 MED FILL — FINASTERIDE 5 MG TABLET: 5 | 30 days supply | Qty: 30 | Fill #0

## 2015-12-05 MED FILL — CARBIDOPA-LEVODOPA 25-100 T: 25-100 | 90 days supply | Qty: 405 | Fill #1

## 2015-12-16 MED FILL — ISOSORBIDE MN ER 30 MG TAB: 30 | 30 days supply | Qty: 30 | Fill #1

## 2015-12-18 ENCOUNTER — Ambulatory Visit
Admission: RE | Admit: 2015-12-18 | Discharge: 2015-12-18 | Disposition: A | Discharge status: Home / Self Care | Payer: 59 | Source: Ambulatory Visit | Attending: Family Medicine | Admitting: Family Medicine

## 2015-12-18 ENCOUNTER — Encounter: Payer: Self-pay | Admitting: Family Medicine

## 2015-12-18 ENCOUNTER — Ambulatory Visit (INDEPENDENT_AMBULATORY_CARE_PROVIDER_SITE_OTHER): Payer: 59 | Admitting: Family Medicine

## 2015-12-18 VITALS — BP 100/60 | HR 70 | Wt 167.0 lb

## 2015-12-18 DIAGNOSIS — M79674 Pain in right toe(s): Secondary | ICD-10-CM | POA: Diagnosis not present

## 2015-12-18 DIAGNOSIS — M79671 Pain in right foot: Secondary | ICD-10-CM | POA: Diagnosis not present

## 2015-12-18 DIAGNOSIS — L989 Disorder of the skin and subcutaneous tissue, unspecified: Secondary | ICD-10-CM | POA: Diagnosis not present

## 2015-12-18 DIAGNOSIS — M25362 Other instability, left knee: Secondary | ICD-10-CM

## 2015-12-18 NOTE — Progress Notes (Signed)
   Subjective:    Patient ID: [name], male    DOB: 06-17-1951, 65 y.o.   MRN: [id number]  HPI  he is here for consult concerning a 2 week history of noting that when he stands, his knee will start to wobble. He shows slight extension and flexion of the knee to me. It is only when he is standing. He is had no numbness, tingling or weakness. He said no trouble walking and has not fallen. Notes this occurring only in the left knee and no other parts of the body. He also has a lesion on his left gluteal area they would like me to evaluate. He also has a two-year  She had difficulty with right second toe pain. There is a history of injury to this 2 years ago.  Apparently x-rays at that time were negative.   Review of Systems     Objective:   Physical Exam  alert and in no distress. Exam of the left leg shows normal strength, sensation, DTRs. No palpable tenderness. Knee motion is normal. Exam of the left gluteal area does show a pigmented lesion of approximately 1-2 cm in size. The pigment does show some slight variation. Exam of the right foot does show slight tenderness over the MTP joint with question of osteophyte to the lateral aspect of that.       Assessment & Plan:  Pain of toe of right foot - Plan: DG Foot Complete Right  Skin lesion  Knee gives way, left  he is to return for shave excision of the lesion on his gluteal area. Await on the x-rays before doing anything with the foot. z knee pain and explained that at this point there is nothing that I can point to in particular concerning this and recommend watchful waiting.

## 2015-12-20 DIAGNOSIS — H34812 Central retinal vein occlusion, left eye, with macular edema: Secondary | ICD-10-CM | POA: Diagnosis not present

## 2015-12-25 ENCOUNTER — Other Ambulatory Visit (HOSPITAL_COMMUNITY): Payer: Self-pay | Admitting: Sports Medicine

## 2015-12-25 DIAGNOSIS — L985 Mucinosis of the skin: Secondary | ICD-10-CM | POA: Diagnosis not present

## 2015-12-25 DIAGNOSIS — M25512 Pain in left shoulder: Secondary | ICD-10-CM

## 2015-12-25 DIAGNOSIS — M75112 Incomplete rotator cuff tear or rupture of left shoulder, not specified as traumatic: Secondary | ICD-10-CM | POA: Diagnosis not present

## 2015-12-27 MED FILL — IRBESARTAN 150 MG TABLET: 150 | 90 days supply | Qty: 90 | Fill #2

## 2015-12-30 ENCOUNTER — Telehealth: Payer: Self-pay | Admitting: Family Medicine

## 2015-12-30 NOTE — Telephone Encounter (Signed)
Pt states he already sees Dr. Paulla Fore at [facility] and wants to know if that is ok for him to see him regarding his foot or does he need to go to Dr. Sharol Given? Please advise

## 2015-12-30 NOTE — Telephone Encounter (Signed)
I have faxed referral to [facility] to [facility]

## 2015-12-30 NOTE — Telephone Encounter (Signed)
[name] will be fine and if he needs to see due to every week and work that out

## 2016-01-06 ENCOUNTER — Other Ambulatory Visit: Payer: Self-pay | Admitting: Family Medicine

## 2016-01-06 ENCOUNTER — Ambulatory Visit (INDEPENDENT_AMBULATORY_CARE_PROVIDER_SITE_OTHER): Payer: 59 | Admitting: Family Medicine

## 2016-01-06 ENCOUNTER — Encounter: Payer: Self-pay | Admitting: Family Medicine

## 2016-01-06 VITALS — BP 124/72 | HR 61 | Wt 174.8 lb

## 2016-01-06 DIAGNOSIS — L821 Other seborrheic keratosis: Secondary | ICD-10-CM | POA: Diagnosis not present

## 2016-01-06 DIAGNOSIS — L989 Disorder of the skin and subcutaneous tissue, unspecified: Secondary | ICD-10-CM | POA: Diagnosis not present

## 2016-01-06 MED ORDER — LIDOCAINE-EPINEPHRINE 1 %-1:100000 IJ SOLN
2.0000 mL | Freq: Once | INTRAMUSCULAR | Status: AC
  Administered 2016-01-06: 2 mL

## 2016-01-06 MED FILL — TIMOLOL 0.5% EYE DROPS: 0.5 | 30 days supply | Qty: 5 | Fill #3

## 2016-01-06 MED FILL — FINASTERIDE 5 MG TABLET: 5 | 30 days supply | Qty: 30 | Fill #1

## 2016-01-06 NOTE — Progress Notes (Signed)
   Subjective:    Patient ID: TORONTO TRABERT, male    DOB: 1951/05/22, 65 y.o.   MRN: [id number]  HPI He is here for removal of a skin lesion on the left buttock. It is very sleepy pigmented and apparently has changed.   Review of Systems     Objective:   Physical Exam A well-demarcated pigmented 1.5 cm lesion is noted on the left mid buttock area.       Assessment & Plan:  Skin lesion Lesion was injected with Xylocaine/ epinephrine. A shave excision was done and the base was hyfrecated. He tolerated the procedure well.

## 2016-01-06 NOTE — Addendum Note (Signed)
Addended by: Minette Headland A on: 01/06/2016 01:30 PM   Modules accepted: Orders

## 2016-01-10 ENCOUNTER — Encounter: Payer: Self-pay | Admitting: Family Medicine

## 2016-01-13 ENCOUNTER — Ambulatory Visit (HOSPITAL_COMMUNITY)
Admission: RE | Admit: 2016-01-13 | Discharge: 2016-01-13 | Disposition: A | Discharge status: Home / Self Care | Payer: 59 | Source: Ambulatory Visit | Attending: Sports Medicine | Admitting: Sports Medicine

## 2016-01-13 DIAGNOSIS — M25512 Pain in left shoulder: Secondary | ICD-10-CM

## 2016-01-13 DIAGNOSIS — X58XXXA Exposure to other specified factors, initial encounter: Secondary | ICD-10-CM | POA: Diagnosis not present

## 2016-01-13 DIAGNOSIS — S46812A Strain of other muscles, fascia and tendons at shoulder and upper arm level, left arm, initial encounter: Secondary | ICD-10-CM | POA: Diagnosis not present

## 2016-01-13 DIAGNOSIS — S43492A Other sprain of left shoulder joint, initial encounter: Secondary | ICD-10-CM | POA: Diagnosis not present

## 2016-01-13 MED ORDER — GADOBENATE DIMEGLUMINE 529 MG/ML IV SOLN
5.0000 mL | Freq: Once | INTRAVENOUS | Status: AC | PRN
  Administered 2016-01-13: 0.05 mL via INTRA_ARTICULAR

## 2016-01-13 MED ORDER — LIDOCAINE HCL (PF) 1 % IJ SOLN
INTRAMUSCULAR | Status: AC
  Administered 2016-01-13: 5 mL
  Filled 2016-01-13: qty 5

## 2016-01-13 MED ORDER — IOPAMIDOL (ISOVUE-M 200) INJECTION 41%
20.0000 mL | Freq: Once | INTRAMUSCULAR | Status: AC
  Administered 2016-01-13: 10 mL via INTRA_ARTICULAR

## 2016-01-13 MED ORDER — LIDOCAINE HCL (PF) 1 % IJ SOLN
INTRAMUSCULAR | Status: AC
  Filled 2016-01-13: qty 5

## 2016-01-13 MED ORDER — IOPAMIDOL (ISOVUE-M 200) INJECTION 41%
INTRAMUSCULAR | Status: AC
  Administered 2016-01-13: 10 mL via INTRA_ARTICULAR
  Filled 2016-01-13: qty 10

## 2016-01-13 MED ORDER — LIDOCAINE HCL (PF) 1 % IJ SOLN
5.0000 mL | Freq: Once | INTRAMUSCULAR | Status: AC
  Administered 2016-01-13: 5 mL via INTRADERMAL

## 2016-01-13 MED ORDER — LIDOCAINE HCL (PF) 1 % IJ SOLN
5.0000 mL | Freq: Once | INTRAMUSCULAR | Status: AC
  Administered 2016-01-13: 5 mL

## 2016-01-14 MED FILL — ISOSORBIDE MN ER 30 MG TAB: 30 | 30 days supply | Qty: 30 | Fill #2

## 2016-01-15 DIAGNOSIS — M19271 Secondary osteoarthritis, right ankle and foot: Secondary | ICD-10-CM | POA: Diagnosis not present

## 2016-01-15 DIAGNOSIS — M25512 Pain in left shoulder: Secondary | ICD-10-CM | POA: Diagnosis not present

## 2016-01-15 DIAGNOSIS — M75112 Incomplete rotator cuff tear or rupture of left shoulder, not specified as traumatic: Secondary | ICD-10-CM | POA: Diagnosis not present

## 2016-01-17 MED FILL — NITROGLYCERIN 0.2 MG/HR PTC: 0.2 | 30 days supply | Qty: 8 | Fill #1

## 2016-01-22 DIAGNOSIS — H34812 Central retinal vein occlusion, left eye, with macular edema: Secondary | ICD-10-CM | POA: Diagnosis not present

## 2016-01-22 DIAGNOSIS — H35352 Cystoid macular degeneration, left eye: Secondary | ICD-10-CM | POA: Diagnosis not present

## 2016-01-22 DIAGNOSIS — H34832 Tributary (branch) retinal vein occlusion, left eye, with macular edema: Secondary | ICD-10-CM | POA: Diagnosis not present

## 2016-01-22 DIAGNOSIS — H35372 Puckering of macula, left eye: Secondary | ICD-10-CM | POA: Diagnosis not present

## 2016-01-24 DIAGNOSIS — M2041 Other hammer toe(s) (acquired), right foot: Secondary | ICD-10-CM | POA: Diagnosis not present

## 2016-01-24 DIAGNOSIS — M2021 Hallux rigidus, right foot: Secondary | ICD-10-CM | POA: Diagnosis not present

## 2016-01-27 MED FILL — ALFUZOSIN HCL ER 10 MG TAB: 10 | 90 days supply | Qty: 90 | Fill #3

## 2016-02-07 ENCOUNTER — Other Ambulatory Visit: Payer: Self-pay | Admitting: Family Medicine

## 2016-02-07 MED FILL — FINASTERIDE 5 MG TABLET: 5 | 30 days supply | Qty: 30 | Fill #2

## 2016-02-07 NOTE — Telephone Encounter (Signed)
Is this ok to refill?  

## 2016-02-13 ENCOUNTER — Encounter: Payer: Self-pay | Admitting: Cardiology

## 2016-02-13 ENCOUNTER — Ambulatory Visit (INDEPENDENT_AMBULATORY_CARE_PROVIDER_SITE_OTHER): Payer: 59 | Admitting: Cardiology

## 2016-02-13 VITALS — BP 114/76 | HR 80 | Ht 70.5 in | Wt 173.4 lb

## 2016-02-13 DIAGNOSIS — R0789 Other chest pain: Secondary | ICD-10-CM | POA: Diagnosis not present

## 2016-02-13 DIAGNOSIS — R079 Chest pain, unspecified: Secondary | ICD-10-CM

## 2016-02-13 DIAGNOSIS — I10 Essential (primary) hypertension: Secondary | ICD-10-CM

## 2016-02-13 DIAGNOSIS — E785 Hyperlipidemia, unspecified: Secondary | ICD-10-CM

## 2016-02-13 MED ORDER — ISOSORBIDE MONONITRATE ER 30 MG PO TB24: 30.0000 mg | ORAL_TABLET | ORAL | Status: DC | PRN

## 2016-02-13 NOTE — Patient Instructions (Signed)
Medication Instructions:   Okay to hold the isosorbide. If chest pain symptoms return, you are to restart this.    Labwork:  none  Testing/Procedures:  none  Follow-Up:  Your physician wants you to follow-up in: 6 months or sooner if needed. You will receive a reminder letter in the mail two months in advance. If you don't receive a letter, please call our office to schedule the follow-up appointment.  Any Other Special Instructions Will Be Listed Below (If Applicable).

## 2016-02-13 NOTE — Progress Notes (Signed)
PCP: Wyatt Haste, MD  Clinic Note: Chief Complaint  Patient presents with  . Follow-up    3 months - after chest pain evaluation with Myoview    HPI: [name] is a 65 y.o. male with a PMH below who presents today for evaluation of CP - requiring NTG. Has no documented CAD. He is a former patient of Dr. Myrtice Lauth then he saw Dr. Rollene Fare until he retired.  He is being followed for cardiac risk factors of hypertension, hyperlipidemia and had a catheterization performed in 2005 for chest discomfort which only showed minor coronary disease. He also negative Myoview in 2011. He was last seen by Dr. Rollene Fare in June 2014 following a trip to [place] with his wife & I first saw him in December 2014. He was doing very well with no major complaints. I felt he was stable for 1 year followup. He saw Mr. Samara Snide, [place] back in March with complaints of feeling sluggish and poor energy. He also felt like he was having some memory issues. At that time his ACE inhibitor dose was reduced. Was converted from lisinopril to Avapro because of cough.  [name] was last seen in Dec 2016.He was doing pretty well with no major complaints. Had some intermittent chest discomfort that usually while in bed, sitting or relaxing. GI evaluation was performed and suggested that could be esophageal spasm. EGD was unremarkable.  Recent Hospitalizations: n/a  Studies Reviewed:    Study Highlights     Nuclear stress EF: 69%.  Blood pressure demonstrated a hypertensive response to exercise.  Upsloping ST segment depression ST segment depression of 1 mm was noted during stress in the V5, V6, aVF, III and II leads, and returning to baseline after less than 1 minute of recovery.  Defect 1: There is a small defect of mild severity present in the basal anterolateral location.  The study is normal.  This is a low risk study.  Low risk stress nuclear study with a small fixed basal lateral  defect, likely representing artifact. Otherwise normal perfusion and normal left ventricular regional and global systolic function.    Interval History: [name] presents today again Really noting that he is feeling much better. He denies any further episodes of chest discomfort. Not complaining of any more dizziness. No further chest pain episodes and overall no significant exertional dyspnea. He thinks the episodes may probably be related to GI issues which does make sense. He has had some of the shoulder and back discomfort, but nothing overly worrisome. No significant resting or exertional dyspnea.  No PND, orthopnea or edema.  No palpitations, lightheadedness, dizziness, weakness or syncope/near syncope. No TIA/amaurosis fugax symptoms.  No claudication.  ROS: A comprehensive was performed. Review of Systems  Constitutional: Negative for malaise/fatigue and diaphoresis.  HENT: Negative for nosebleeds.   Eyes: Negative for blurred vision.  Respiratory: Positive for shortness of breath (Not more than baseline). Negative for cough and wheezing.   Cardiovascular: Positive for palpitations. Negative for chest pain and claudication.  Gastrointestinal: Positive for heartburn. Negative for abdominal pain, diarrhea, constipation, blood in stool and melena.  Genitourinary: Negative for hematuria.  Musculoskeletal: Negative for myalgias, joint pain and falls.  Neurological: Negative for dizziness (Better now with reduced dose of HCTZ) and headaches.  Endo/Heme/Allergies: Does not bruise/bleed easily.  Psychiatric/Behavioral: Negative for depression, hallucinations and memory loss. The patient is not nervous/anxious and does not have insomnia.   All other systems reviewed and are negative.  Past Medical History  Diagnosis Date  . Hypertension   . Allergy   . PVD (peripheral vascular disease) ([place])     With no claudication; only mild abdominal aortic atherosclerosis noted on ultrasound.  .  Diverticulosis   . Hepatitis C     chronic  . HLD (hyperlipidemia)     statin intolerant (Crestor & Simvastatin) - Taking Livalo 1mg  / week  . Plantar fasciitis     right  . Head injury, closed, with concussion ([place])   . Ulcer   . Glaucoma   . Gout   . Complication of anesthesia     Pt. stated he had a reaction that ended in him requiring urinary cath placement    Past Surgical History  Procedure Laterality Date  . Tonsillectomy    . Cardiac catheterization  2005    30% Cx.   [name] Nm myoview ltd  2011    Neg Ischemia or infarct.  . Transthoracic echocardiogram  2013    Normal EF. No significant Valve Disease  . Colonoscopy    . Upper gastrointestinal endoscopy    . Eye surgery    . Pars plana vitrectomy Left 03/14/2014    Procedure: PARS PLANA VITRECTOMY WITH 25 GAUGE;  Surgeon: Hurman Horn, MD;  Location: [facility];  Service: Ophthalmology;  Laterality: Left;  [name] Membrane peel Left 03/14/2014    Procedure: MEMBRANE PEEL; ENDOLASER;  Surgeon: Hurman Horn, MD;  Location: [facility];  Service: Ophthalmology;  Laterality: Left;  . Cataract surgery Left 11/05/2014  . Esophagogastroduodenoscopy N/A 05/02/2015    Procedure: ESOPHAGOGASTRODUODENOSCOPY (EGD);  Surgeon: Ronald Lobo, MD;  Location: [facility];  Service: Endoscopy;  Laterality: N/A;  . Esophagogastroduodenoscopy  05/02/2015    no source of pt chest pain endoscopically evident. small hiatal hernia.    Prior to Admission medications   Medication Sig Start Date End Date Taking? Authorizing Provider  alfuzosin (UROXATRAL) 10 MG 24 hr tablet Take 10 mg by mouth daily with breakfast.   Yes Historical Provider, MD  aspirin EC 81 MG tablet Take 81 mg by mouth daily.   Yes Historical Provider, MD  calcium carbonate (OS-CAL) 600 MG TABS tablet Take 600 mg by mouth daily with breakfast.   Yes Historical Provider, MD  carbidopa-levodopa (SINEMET IR) 25-100 MG tablet TAKE 1 AND 1/2 TABLET BY MOUTH 3 TIMES DAILY. 08/28/15  Yes Garvin Fila, MD  hydrochlorothiazide (MICROZIDE) 12.5 MG capsule Take 1 capsule (12.5 mg total) by mouth 2 (two) times daily. Patient taking differently: Take 12.5 mg by mouth daily.  07/26/15  Yes [name], MD  irbesartan (AVAPRO) 150 MG tablet Take 1 tablet (150 mg total) by mouth daily. 07/02/15  Yes [name], MD  Multiple Vitamin (MULTIVITAMIN WITH MINERALS) TABS tablet Take 1 tablet by mouth daily.   Yes Historical Provider, MD  nitroGLYCERIN (NITROSTAT) 0.4 MG SL tablet Place 1 tablet (0.4 mg total) under the tongue every 5 (five) minutes as needed for chest pain. 07/02/15  Yes [name], MD  pantoprazole (PROTONIX) 40 MG tablet Take 40 mg by mouth 2 (two) times daily. 06/17/15  Yes Historical Provider, MD  Pitavastatin Calcium 1 MG TABS Take 1 tablet (1 mg total) by mouth 2 (two) times a week. 07/02/15  Yes [name], MD  timolol (TIMOPTIC) 0.5 % ophthalmic solution Place 1 drop into both eyes daily.   Yes Historical Provider, MD  verapamil (CALAN-SR) 240 MG CR tablet TAKE 1 TABLET  BY MOUTH DAILY. 08/15/15  Yes [name], MD   No Known Allergies   Social History   Social History  . Marital Status: Married    Spouse Name: barbra gen  . Number of Children: 3  . Years of Education: AS   Occupational History  . self employed    Social History Main Topics  . Smoking status: Former Smoker    Types: Cigars    Quit date: 07/27/1988  . Smokeless tobacco: Never Used  . Alcohol Use: 1.8 oz/week    1 Glasses of wine, 1 Cans of beer, 1 Shots of liquor, 0 Standard drinks or equivalent per week     Comment: occasional  . Drug Use: No  . Sexual Activity: Yes   Other Topics Concern  . 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     Wife is retired Therapist, sports from [employer].   He walks roughly 2-3 miles in bed time at least 2-3 times a week. He quit smoking 30 years ago.   family history includes Cancer in  his paternal grandfather and sister; Heart disease in his mother.   Wt Readings from Last 3 Encounters:  02/13/16 173 lb 6.4 oz (78.654 kg)  01/06/16 174 lb 12.8 oz (79.289 kg)  12/18/15 167 lb (75.751 kg)    PHYSICAL EXAM BP 114/76 mmHg  Pulse 80  Ht 5' 10.5" (1.791 m)  Wt 173 lb 6.4 oz (78.654 kg)  BMI 24.52 kg/m2 General appearance: alert and oriented x3, cooperative, appears stated age, no distress and Well-nourished/well groomed. Normal mood and affect. Answers questions appropriately. Neck: no adenopathy, no carotid bruit, no JVD, supple, symmetrical, trachea midline and thyroid not enlarged, symmetric, no tenderness/mass/nodules Lungs: clear to auscultation bilaterally, normal percussion bilaterally and Nonlabored, good air movement Heart: normal apical impulse, S1, S2 normal, no S3 or S4 and No R./G. 1/6 SEM at the base Abdomen: soft, non-tender; bowel sounds normal; no masses, no organomegaly Extremities: extremities normal, atraumatic, no cyanosis or edema;  Pulses: 2+ and symmetric Skin: Skin color, texture, turgor normal. No rashes or lesion; 2 tone and color change in the face the scalp. Neurologic: Grossly normal    Adult ECG Report Not checked  Other studies Reviewed: Additional studies/ records that were reviewed today include:  Recent Labs:   Lab Results  Component Value Date   CHOL 150 07/02/2015   HDL 50 07/02/2015   LDLCALC 93 07/02/2015   TRIG 37 07/02/2015   CHOLHDL 3.0 07/02/2015    ASSESSMENT / PLAN: Problem List Items Addressed This Visit    Moderate essential hypertension (Chronic)    Excellent control with verapamil and Avapro. We'll need to monitor transition off of Imdur. We have stopped HCTZ. I think for now given his dizziness on HCTZ we will continue to hold      Relevant Medications   isosorbide mononitrate (IMDUR) 30 MG 24 hr tablet   Hyperlipidemia with target LDL less than 100 (Chronic)    Last check was in December 2016. He is  on Livalo. No myalgias. With abdominal aortic atherosclerosis and potential CAD concerned with family history and the presence of atherosclerosis elsewhere, I think maintaining LDL less than 100 is appropriate. If no labs are checked by PCP but, see him back, we will recheck labs.      Relevant Medications   isosorbide mononitrate (IMDUR) 30 MG 24 hr tablet   Chest pain with low  risk for cardiac etiology    Relieved with results negative Myoview. No further episodes of chest discomfort.  This combination makes it less likely to be cardiac in nature.   I'm not sure if this is related to him being on Imdur not. I think is probably okay to stop Imdur in order to see if the symptoms recur. It could be coronary spasm versus esophageal spasm as it seemed to be related to food. If symptoms recur, restart Imdur.       Other Visit Diagnoses    Chest discomfort    -  Primary    Relevant Medications    isosorbide mononitrate (IMDUR) 30 MG 24 hr tablet       Current medicines are reviewed at length with the patient today. (+/- concerns) none The following changes have been made:  Okay to hold Imdur. If chest pain requiring nitroglycerin, restart.  Studies Ordered: None  No orders of the defined types were placed in this encounter.   Your physician wants you to follow-up in: 6 months, or sooner if needed.     Glenetta Hew, M.D., M.S. Interventional Cardiologist   Pager # [phone] Phone # [phone] [address] [place], [place] [address]

## 2016-02-15 ENCOUNTER — Encounter: Payer: Self-pay | Admitting: Cardiology

## 2016-02-15 NOTE — Assessment & Plan Note (Addendum)
Last check was in December 2016. He is on Livalo. No myalgias. With abdominal aortic atherosclerosis and potential CAD concerned with family history and the presence of atherosclerosis elsewhere, I think maintaining LDL less than 100 is appropriate. If no labs are checked by PCP but, see him back, we will recheck labs.

## 2016-02-15 NOTE — Assessment & Plan Note (Signed)
Excellent control with verapamil and Avapro. We'll need to monitor transition off of Imdur. We have stopped HCTZ. I think for now given his dizziness on HCTZ we will continue to hold

## 2016-02-15 NOTE — Assessment & Plan Note (Signed)
Relieved with results negative Myoview. No further episodes of chest discomfort.  This combination makes it less likely to be cardiac in nature.   I'm not sure if this is related to him being on Imdur not. I think is probably okay to stop Imdur in order to see if the symptoms recur. It could be coronary spasm versus esophageal spasm as it seemed to be related to food. If symptoms recur, restart Imdur.

## 2016-02-20 ENCOUNTER — Encounter: Payer: Self-pay | Admitting: Family Medicine

## 2016-02-20 ENCOUNTER — Ambulatory Visit (INDEPENDENT_AMBULATORY_CARE_PROVIDER_SITE_OTHER): Payer: 59 | Admitting: Family Medicine

## 2016-02-20 VITALS — BP 124/82 | HR 89 | Wt 171.4 lb

## 2016-02-20 DIAGNOSIS — M79675 Pain in left toe(s): Secondary | ICD-10-CM

## 2016-02-20 DIAGNOSIS — M461 Sacroiliitis, not elsewhere classified: Secondary | ICD-10-CM

## 2016-02-20 MED FILL — VERAPAMIL ER 240 MG TABLET: 240 | 90 days supply | Qty: 90 | Fill #2

## 2016-02-20 NOTE — Progress Notes (Signed)
   Subjective:    Patient ID: [name], male    DOB: 09/08/1950, 65 y.o.   MRN: [id number]  HPI He is here for evaluation of 2 issues. He is having some right hip discomfort has been going on for several months. He thinks it's related to some right foot pain that he has seen Dr. Sharol Given in the past for. Dr. Sharol Given has recommended surgery. He does note that he walks with an abnormal gait. He notes pain in the right SI joint area when he lies flat and also when he is sitting for long period of time. During the day is less bothersome. He also complains of a two-week history of decreased sensation to the left fifth toe and to a lesser extent the fourth toe. The symptoms do get better when he wears flip-flops.   Review of Systems     Objective:   Physical Exam Alert and in no distress. Exam of the left foot shows normal sensation except for the fifth toe and normal skin. Also is are normal. Normal strength. Exam of his back does show some SI joint tenderness but provocative testing is negative. Negative straight leg raising. Motion.       Assessment & Plan:  Sacroiliitis ([place])  Pain in left toe(s) I explained the fact symptoms could easily be related to the trouble he is having with his feet. Explained closed chain concept. Recommended and demonstrated flexing techniques to help with this. No particular therapy offered for the toes. I did explain that I did not think it was of any major significance. Discussed possibly podiatry.

## 2016-02-25 ENCOUNTER — Other Ambulatory Visit: Payer: Self-pay | Admitting: Nurse Practitioner

## 2016-02-25 DIAGNOSIS — B182 Chronic viral hepatitis C: Secondary | ICD-10-CM | POA: Diagnosis not present

## 2016-02-25 DIAGNOSIS — K7469 Other cirrhosis of liver: Secondary | ICD-10-CM

## 2016-02-26 DIAGNOSIS — H34812 Central retinal vein occlusion, left eye, with macular edema: Secondary | ICD-10-CM | POA: Diagnosis not present

## 2016-02-26 DIAGNOSIS — M25512 Pain in left shoulder: Secondary | ICD-10-CM | POA: Diagnosis not present

## 2016-02-26 DIAGNOSIS — M75112 Incomplete rotator cuff tear or rupture of left shoulder, not specified as traumatic: Secondary | ICD-10-CM | POA: Diagnosis not present

## 2016-02-26 MED FILL — AMITRIPTYLINE HCL 25 MG TAB: 25 | 15 days supply | Qty: 30 | Fill #0

## 2016-02-26 MED FILL — NITROGLYCERIN 0.2 MG/HR PTC: 0.2 | 28 days supply | Qty: 8 | Fill #0

## 2016-02-26 MED FILL — ISOSORBIDE MN ER 30 MG TAB: 30 | 30 days supply | Qty: 30 | Fill #3

## 2016-03-02 DIAGNOSIS — N400 Enlarged prostate without lower urinary tract symptoms: Secondary | ICD-10-CM | POA: Diagnosis not present

## 2016-03-04 ENCOUNTER — Ambulatory Visit
Admission: RE | Admit: 2016-03-04 | Discharge: 2016-03-04 | Disposition: A | Discharge status: Home / Self Care | Payer: 59 | Source: Ambulatory Visit | Attending: Nurse Practitioner | Admitting: Nurse Practitioner

## 2016-03-04 DIAGNOSIS — K7460 Unspecified cirrhosis of liver: Secondary | ICD-10-CM | POA: Diagnosis not present

## 2016-03-04 DIAGNOSIS — K7469 Other cirrhosis of liver: Secondary | ICD-10-CM

## 2016-03-05 ENCOUNTER — Other Ambulatory Visit: Payer: Self-pay | Admitting: Neurology

## 2016-03-05 MED FILL — CARBIDOPA-LEVODOPA 25-100 T: 25-100 | 90 days supply | Qty: 405 | Fill #0

## 2016-03-09 ENCOUNTER — Encounter: Payer: 59 | Admitting: Sports Medicine

## 2016-03-24 ENCOUNTER — Telehealth: Payer: Self-pay | Admitting: Internal Medicine

## 2016-03-24 NOTE — Telephone Encounter (Signed)
Pt is scheduled for CT Chest on 04/02/16 @ 3:30pm with [facility] at [address]. Pt is aware of appt. No prior auth it needed per cheri

## 2016-04-01 DIAGNOSIS — H34812 Central retinal vein occlusion, left eye, with macular edema: Secondary | ICD-10-CM | POA: Diagnosis not present

## 2016-04-02 ENCOUNTER — Other Ambulatory Visit: Payer: 59

## 2016-04-03 MED FILL — TIMOLOL 0.5% EYE DROPS: 0.5 | 30 days supply | Qty: 5 | Fill #4

## 2016-04-03 MED FILL — LIVALO 1 MG TABLET: 1 | 84 days supply | Qty: 24 | Fill #2

## 2016-04-03 MED FILL — IRBESARTAN 150 MG TABLET: 150 | 90 days supply | Qty: 90 | Fill #3

## 2016-04-08 ENCOUNTER — Ambulatory Visit
Admission: RE | Admit: 2016-04-08 | Discharge: 2016-04-08 | Disposition: A | Discharge status: Home / Self Care | Payer: 59 | Source: Ambulatory Visit | Attending: Family Medicine | Admitting: Family Medicine

## 2016-04-08 DIAGNOSIS — R9389 Abnormal findings on diagnostic imaging of other specified body structures: Secondary | ICD-10-CM

## 2016-04-08 DIAGNOSIS — R911 Solitary pulmonary nodule: Secondary | ICD-10-CM | POA: Diagnosis not present

## 2016-04-08 MED ORDER — IOPAMIDOL (ISOVUE-300) INJECTION 61%
75.0000 mL | Freq: Once | INTRAVENOUS | Status: AC | PRN
  Administered 2016-04-08: 75 mL via INTRAVENOUS

## 2016-04-21 ENCOUNTER — Telehealth: Payer: Self-pay | Admitting: Family Medicine

## 2016-04-21 NOTE — Telephone Encounter (Signed)
lmtcb

## 2016-04-21 NOTE — Telephone Encounter (Signed)
Set him up for repeat chest CT in 6 months

## 2016-04-21 NOTE — Telephone Encounter (Signed)
Pt called wanting to know the results of recent CT that he had done at [facility]. He is a little frustrated that he has not heard anything yet & would like to know what the findings were today if at all possible.

## 2016-04-22 NOTE — Telephone Encounter (Signed)
Pt notified. /RLB  

## 2016-04-23 ENCOUNTER — Telehealth: Payer: Self-pay

## 2016-04-23 NOTE — Telephone Encounter (Signed)
Set him up for follow-up CT in 6 months

## 2016-04-23 NOTE — Telephone Encounter (Signed)
Pt states that he would like a callback from you. He states that you told him that next CT scan would be in 18 months, but I contacted pt yesterday (I was not aware you had already spoke with him) and informed him of repeat in 6 months per your response to Cheri. He states he would like to talk to you to get clarification.  Advised him per CT notes that he needed a CT in 6 months to check stability of nodules and then if they are stable, that is when they will repeat CT in 18 months. He can be reached at [phone].

## 2016-04-24 ENCOUNTER — Other Ambulatory Visit: Payer: Self-pay

## 2016-04-24 DIAGNOSIS — R918 Other nonspecific abnormal finding of lung field: Secondary | ICD-10-CM

## 2016-04-24 NOTE — Telephone Encounter (Signed)
Order is in ct no contrast

## 2016-05-04 DIAGNOSIS — H34812 Central retinal vein occlusion, left eye, with macular edema: Secondary | ICD-10-CM | POA: Diagnosis not present

## 2016-05-06 ENCOUNTER — Telehealth: Payer: Self-pay | Admitting: Family Medicine

## 2016-05-06 MED FILL — ALFUZOSIN HCL ER 10 MG TAB: 10 | 30 days supply | Qty: 30 | Fill #0

## 2016-05-06 NOTE — Telephone Encounter (Signed)
Pt is seeing Dr Sharol Given a ortho dr and he is recommending foot surgery but he is wanting to know if he should see a foot doctor for a second option before having the surgery, he wants to know what you think and would like you to give him a call pertaining this, pt can be reached at [phone]

## 2016-05-29 ENCOUNTER — Encounter: Payer: Self-pay | Admitting: Neurology

## 2016-05-29 MED FILL — VERAPAMIL ER 240 MG TABLET: 240 | 90 days supply | Qty: 90 | Fill #3

## 2016-06-08 DIAGNOSIS — H34812 Central retinal vein occlusion, left eye, with macular edema: Secondary | ICD-10-CM | POA: Diagnosis not present

## 2016-06-15 ENCOUNTER — Other Ambulatory Visit: Payer: Self-pay | Admitting: Neurology

## 2016-06-15 MED FILL — CARBIDOPA-LEVODOPA 25-100 T: 25-100 | 30 days supply | Qty: 135 | Fill #0

## 2016-06-15 MED FILL — ALFUZOSIN HCL ER 10 MG TAB: 10 | 90 days supply | Qty: 90 | Fill #1

## 2016-07-03 ENCOUNTER — Ambulatory Visit: Payer: 59 | Admitting: Neurology

## 2016-07-03 ENCOUNTER — Other Ambulatory Visit: Payer: Self-pay | Admitting: Cardiology

## 2016-07-03 DIAGNOSIS — B182 Chronic viral hepatitis C: Secondary | ICD-10-CM | POA: Diagnosis not present

## 2016-07-03 DIAGNOSIS — R079 Chest pain, unspecified: Secondary | ICD-10-CM | POA: Diagnosis not present

## 2016-07-03 DIAGNOSIS — K219 Gastro-esophageal reflux disease without esophagitis: Secondary | ICD-10-CM | POA: Diagnosis not present

## 2016-07-03 MED FILL — IRBESARTAN 150 MG TABLET: 150 | 90 days supply | Qty: 90 | Fill #0

## 2016-07-03 NOTE — Telephone Encounter (Signed)
Rx(s) sent to pharmacy electronically.  

## 2016-07-06 ENCOUNTER — Ambulatory Visit (INDEPENDENT_AMBULATORY_CARE_PROVIDER_SITE_OTHER): Payer: 59 | Admitting: Family Medicine

## 2016-07-06 ENCOUNTER — Encounter: Payer: Self-pay | Admitting: Family Medicine

## 2016-07-06 VITALS — BP 126/82 | HR 73 | Wt 175.0 lb

## 2016-07-06 DIAGNOSIS — M25551 Pain in right hip: Secondary | ICD-10-CM

## 2016-07-06 NOTE — Patient Instructions (Signed)
Heat, stretching, concentrate him on normal walking gait. Follow-up with Dr. Sharol Given

## 2016-07-06 NOTE — Progress Notes (Signed)
   Subjective:    Patient ID: [name], male    DOB: [birth year], 65 y.o.   MRN: [id number]  HPI He complains of difficulty with right hip discomfort. It does not bother him when he is moving but does note more difficulty when he lies down. He points to the lateral posterior area of his hip. He is planning on having right second toe surgery and does admit to walking with an abnormal gait due to the toe pain.   Review of Systems     Objective:   Physical Exam [place] and in no distress. Full motion of the hip without pain. No tenderness over the greater trochanter however he does point to the posterolateral aspect of the hip.       Assessment & Plan:  Right hip pain I explained that this is probablyMusculoskeletal in nature and probably related to his abnormal gait. Discussed stretching exercises and trying to walk with a more normal gait. He is also discussed this with Dr. Sharol Given who will be doing the toe surgery.

## 2016-07-13 DIAGNOSIS — H34812 Central retinal vein occlusion, left eye, with macular edema: Secondary | ICD-10-CM | POA: Diagnosis not present

## 2016-07-15 ENCOUNTER — Encounter: Payer: Self-pay | Admitting: Family Medicine

## 2016-07-15 ENCOUNTER — Ambulatory Visit (INDEPENDENT_AMBULATORY_CARE_PROVIDER_SITE_OTHER): Payer: 59 | Admitting: Family Medicine

## 2016-07-15 VITALS — BP 138/80 | HR 69 | Temp 97.4°F | Wt 179.8 lb

## 2016-07-15 DIAGNOSIS — K121 Other forms of stomatitis: Secondary | ICD-10-CM

## 2016-07-15 NOTE — Progress Notes (Signed)
   Subjective:    Patient ID: [name], male    DOB: [birth year], 65 y.o.   MRN: [id number]  HPI Chief Complaint  Patient presents with  . growth    growth under tongue- red spot. noticed it 2 days ago, sore   He is here with complaints of a sore under his tongue that he noticed yesterday. Denies pain.  No fever, chills, unexplained weight loss, fatigue, nausea, vomiting, diarrhea.    Review of Systems Pertinent positives and negatives in the history of present illness.     Objective:   Physical Exam BP 138/80   Pulse 69   Temp 97.4 F (36.3 C) (Oral)   Wt 179 lb 12.8 oz (81.6 kg)   BMI 25.43 kg/m   Redness and mild edema to frenulum. Non tender. No other oral lesions.      Assessment & Plan:  Oral inflammation  Dr. Redmond School and I examined patient and agree that the area is most likely benign and nothing to worry about and will go away in a few days.  Discussed that if it does not clear up to let us know and we will re-evaluate this.

## 2016-07-17 ENCOUNTER — Other Ambulatory Visit: Payer: Self-pay | Admitting: Neurology

## 2016-07-17 MED ORDER — CARBIDOPA-LEVODOPA 25-100 MG PO TABS: ORAL_TABLET | ORAL | 0 refills | Status: DC

## 2016-07-17 MED FILL — CARBIDOPA-LEVODOPA 25-100 T: 25-100 | 90 days supply | Qty: 405 | Fill #0

## 2016-08-11 ENCOUNTER — Ambulatory Visit (INDEPENDENT_AMBULATORY_CARE_PROVIDER_SITE_OTHER): Payer: 59 | Admitting: Neurology

## 2016-08-11 ENCOUNTER — Encounter: Payer: Self-pay | Admitting: Neurology

## 2016-08-11 VITALS — BP 119/82 | HR 68 | Ht 70.0 in | Wt 176.2 lb

## 2016-08-11 DIAGNOSIS — G3184 Mild cognitive impairment, so stated: Secondary | ICD-10-CM | POA: Diagnosis not present

## 2016-08-11 NOTE — Progress Notes (Signed)
[facility]  PATIENT: [name] DOB: [birth year]   REASON FOR VISIT: Follow-up for tremor, mild cognitive impairment, depression HISTORY FROM: Patient    HISTORY OF PRESENT ILLNESS: HISTORY:63 year African American male who since last year and a half has noticed increasing tremors mainly in the left arm and leg and to a lesser degree in the right arm as well. He states the tremors were quite mild but became more pronounced after a minor accident while staying [place] the rental [place] in [place]. He fell down 12 flights of steps and hit a concrete slab and had a concussion. He and some minor bruises and knee injury which took several months to reck of her period CT scan of the head was done 2 days later which was unremarkable. Since then he has had some walking difficulty but this may be related to his knee pain. He says that his feet do at times gets stuck in his started walking with a stooped posture. He however does not describe typical festination. He has resting and intermittent action tremor claiming the left arm and leg the tremor does improve with activities. The tremor does not seem to interfere with most of his routine. He denies significant bradykinesia, and drooling of saliva or micrographia. He has been evaluated by neurologists Dr. Reginia Forts at [facility] neurology but he is unable to tell me for the diagnosis was. He was not told that this may be Parkinson's and was not tried on dopaminergic medications. He did have an MRI scan and some lab work but I do not have those results for my review available today. Patient is here today for a second opinion as he feels his tremor is not getting better. He does admit to some light masonry hallucinations as well as restless sleep thrashing of his legs. He denies significant memory or cognitive difficulties. He has not noticed any particular effect of alcohol on his tremor. Is no family history of tremors.  Patient does have chronic hepatitis C and is planning to start treatment with dapsone. He has had no seizures, significant head injury with major loss of consciousness, stroke, TIAs or significant neurological problems.  Update 08/14/2014 : He returns for follow-up after last visit 3 months ago. He feels his tremors are about the same. He had tried reducing the dose of Sinemet but noticed that his tremors got worse and hence he went back up to the original dose which is 25/100 one tablet 3 times daily. He still feels occasionally confused and at times secondary to some cells but he admits that he worries a lot. He also admits to feeling depressed, getting tired easily and not having initiated. He has not been on any medications for depression. Patient denies significant drooling of saliva, bradykinesia, gait or balance problems. He feels his tremor is mild and does not interfere with his activities of daily living. I discussed alternative treatment options including addition of dopamine agonist, anticholinergic or even consideration for deep brain stimulation since his tremor is predominantly unilateral however the patient does not want to consider more aggressive treatment options at the present time. UPDATE 12/26/14 [name], 66 year old black male returns for follow-up. He was last seen by Dr. [name] 08/14/2014. He feels his tremors are better since increasing his carbidopa levodopa to 1-1/2 tablets 3 times daily. He is tolerating it well without dizziness, sleepiness, nightmares or hallucinations A MRI Brain with and without Contrast completed on 07/29/2011 was normal. The Mini-Mental status exam  today is unchanged 29 out of 30. He did not follow-up with his primary care for  complaints of depression. He denies any significant drooling bradykinesia falls or balance problems. His tremor does not interfere with his activities of daily living. He returns for reevaluation Update 07/03/2015 : He returns for  follow-up after last visit 6 months ago. He states his tremors continue to do well and he seems to have responded quite well to increased dose of Sinemet 25/100 1-1/2 tablet 3 times daily. Is tolerating it well without any dizziness, sleepiness, nightmares, hallucinations, nausea or diarrhea. He continues to have mild memory difficulties but his has not been on any treatment for depression and in fact has not seen his primary care physician for the same for quite some time. Update 08/11/2016 : He returns for follow-up after last visit year ago. He states his tremors as well as memory loss or more or less unchanged. Continues to have intermittent tremors in the left hand mainly. The tremors fluctuate he has good days and bad days. He is currently taking Sinemet 25/100 one and half tablet 3 times daily. He complains of mild fatigue and daytime sleepiness. He denies hallucinations, delusions, dizziness or upset stomach. He does not want to try higher dose because of fear of side effects. Continues to have short-term memory difficulties. He has not been quite compulsive about taking notes and using memory compensation strategies but plans to do so. He has no new complaints today. REVIEW OF SYSTEMS: Full 14 system review of systems performed and notable only for those listed, all others are neg:    Memory loss, fatigue, cough, frequency of urination, joint pain, decreased concentration, daytime sleepiness, snoring tremors only ALLERGIES: No Known Allergies  HOME MEDICATIONS: Outpatient Medications Prior to Visit  Medication Sig Dispense Refill  . alfuzosin (UROXATRAL) 10 MG 24 hr tablet Take 10 mg by mouth daily with breakfast.    . aspirin EC 81 MG tablet Take 81 mg by mouth daily.    . carbidopa-levodopa (SINEMET IR) 25-100 MG tablet TAKE 1 AND 1/2 TABLET BY MOUTH 3 TIMES DAILY. 135 tablet 0  . irbesartan (AVAPRO) 150 MG tablet TAKE 1 TABLET BY MOUTH DAILY. 90 tablet 2  . Multiple Vitamin (MULTIVITAMIN  WITH MINERALS) TABS tablet Take 1 tablet by mouth daily.    . nitroGLYCERIN (NITROSTAT) 0.4 MG SL tablet Place 1 tablet (0.4 mg total) under the tongue every 5 (five) minutes as needed for chest pain. 25 tablet 6  . pantoprazole (PROTONIX) 40 MG tablet Take 40 mg by mouth as needed.   0  . Pitavastatin Calcium 1 MG TABS Take 1 tablet (1 mg total) by mouth 2 (two) times a week. 24 tablet 3  . timolol (TIMOPTIC) 0.5 % ophthalmic solution Place 1 drop into both eyes daily.    . verapamil (CALAN-SR) 240 MG CR tablet TAKE 1 TABLET BY MOUTH DAILY. 30 tablet 11  . calcium carbonate (OS-CAL) 600 MG TABS tablet Take 600 mg by mouth daily with breakfast.    . carbidopa-levodopa (SINEMET IR) 25-100 MG tablet TAKE 1 AND 1/2 TABLET BY MOUTH 3 TIMES DAILY. (Patient not taking: Reported on 08/11/2016) 405 tablet 0  . isosorbide mononitrate (IMDUR) 30 MG 24 hr tablet Take 1 tablet (30 mg total) by mouth as needed. (Patient not taking: Reported on 08/11/2016) 30 tablet 9   No facility-administered medications prior to visit.     PAST MEDICAL HISTORY: Past Medical History:  Diagnosis Date  . Allergy   .  Complication of anesthesia    Pt. stated he had a reaction that ended in him requiring urinary cath placement  . Diverticulosis   . Glaucoma   . Gout   . Head injury, closed, with concussion   . Hepatitis C    chronic  . HLD (hyperlipidemia)    statin intolerant (Crestor & Simvastatin) - Taking Livalo 1mg  / week  . Hypertension   . Plantar fasciitis    right  . PVD (peripheral vascular disease) ([place])    With no claudication; only mild abdominal aortic atherosclerosis noted on ultrasound.  [name] Ulcer ([place])     PAST SURGICAL HISTORY: Past Surgical History:  Procedure Laterality Date  . CARDIAC CATHETERIZATION  2005   30% Cx.   . cataract surgery Left 11/05/2014  . COLONOSCOPY    . ESOPHAGOGASTRODUODENOSCOPY N/A 05/02/2015   Procedure: ESOPHAGOGASTRODUODENOSCOPY (EGD);  Surgeon: Ronald Lobo, MD;   Location: [facility];  Service: Endoscopy;  Laterality: N/A;  . ESOPHAGOGASTRODUODENOSCOPY  05/02/2015   no source of pt chest pain endoscopically evident. small hiatal hernia.  [name] EYE SURGERY    . MEMBRANE PEEL Left 03/14/2014   Procedure: MEMBRANE PEEL; ENDOLASER;  Surgeon: Hurman Horn, MD;  Location: [facility];  Service: Ophthalmology;  Laterality: Left;  . NM MYOVIEW LTD  2011   Neg Ischemia or infarct.  [name] PARS PLANA VITRECTOMY Left 03/14/2014   Procedure: PARS PLANA VITRECTOMY WITH 25 GAUGE;  Surgeon: Hurman Horn, MD;  Location: [facility];  Service: Ophthalmology;  Laterality: Left;  . TONSILLECTOMY    . TRANSTHORACIC ECHOCARDIOGRAM  2013   Normal EF. No significant Valve Disease  . UPPER GASTROINTESTINAL ENDOSCOPY      FAMILY HISTORY: Family History  Problem Relation Age of Onset  . Heart disease Mother   . Cancer Paternal Grandfather   . Cancer Sister     SOCIAL HISTORY: Social History   Social History  . Marital status: Married    Spouse name: barbra gen  . Number of children: 3  . Years of education: AS   Occupational History  . self employed [employer]   Social History Main Topics  . Smoking status: Former Smoker    Types: Cigars    Quit date: 07/27/1988  . Smokeless tobacco: Never Used  . Alcohol use 1.8 oz/week    1 Glasses of wine, 1 Cans of beer, 1 Shots of liquor per week     Comment: occasional  . Drug use: No  . Sexual activity: Yes   Other Topics Concern  . 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     Wife is retired Therapist, sports from [employer].   He walks roughly 2-3 miles in bed time at least 2-3 times a week. He quit smoking 30 years ago.     PHYSICAL EXAM  Vitals:   08/11/16 1134  BP: 119/82  Pulse: 68  Weight: 176 lb 3.2 oz (79.9 kg)  Height: 5\' 10"  (1.778 m)   Body mass index is 25.28 kg/m.  Generalized: Well developed, in no acute distress  Head: normocephalic and  atraumatic,. Oropharynx benign  Neck: Supple, no carotid bruits  Cardiac: Regular rate rhythm, no murmur  Musculoskeletal: No deformity   Neurological examination   Mentation: Alert oriented to time, place, history taking. Attention span and concentration appropriate. Recent and remote memory intact.  Diminished recall 2/3. Animal naming 10. Clock drawing 4/4.Follows all commands  speech and language fluent. MMSE not done today  Cranial nerve II-XII: Fundoscopic exam not done .Pupils were equal round reactive to light extraocular movements were full, visual field were full on confrontational test. Facial sensation and strength were normal. hearing was intact to finger rubbing bilaterally. Uvula tongue midline. head turning and shoulder shrug were normal and symmetric.Tongue protrusion into cheek strength was normal. Mild diminished facial expression Motor: normal bulk and tone, full strength in the BUE, BLE, fine finger movements normal, no pronator drift. No focal weakness. No bradykinesia. Minimal outstretch tremor, cogwheeling at the left elbow and wrist. Coordination: finger-nose-finger, heel-to-shin bilaterally, no dysmetria Reflexes: 1+ upper lower and symmetric plantar responses were flexor bilaterally. Gait and Station: Rising up from seated position without assistance, normal stance,  moderate stride, good arm swing, smooth turning, able to perform tiptoe, and heel walking without difficulty. Tandem gait is steady. Romberg negative Good postural response to threat  DIAGNOSTIC DATA (LABS, IMAGING, TESTING)  ASSESSMENT AND PLAN  66 y.o. 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 Good response to Sinemet. He also complains of mild cognitive impairment which appears stable.   I had a long discussion the patient with regards to his tremors as well as mild cognitive impairment both of  which appear to be stable. Continue Sinemet and the current dose of 25/100 mg 1.5 tablets 3 times daily. I encouraged him to participate in .mentally challenging activities like solving crossword puzzles, sudoku and playing bridge. I advised him to start taking fish oil 1200 mg daily. We also discussed memory compensation strategies. Greater than 50% time during this 25 minute visit was spent on counseling and coordination of care about his tremors and memory loss and cognitive impairment He will return for follow-up in 6 months with my nurse practitioner or call earlier if necessary.Antony Contras, MD [facility] [address] [place], [place] [address] [phone]

## 2016-08-11 NOTE — Patient Instructions (Signed)
I had a long discussion the patient with regards to his tremors as well as mild cognitive impairment both of which appear to be stable. Continue Sinemet and the current dose of 25/100 mg 1.5 tablets 3 times daily. I encouraged him to participate in .mentally challenging activities like solving crossword puzzles, sudoku and playing bridge. I advised him to start taking fish oil 1200 mg daily. We also discussed memory compensation strategies. He will return for follow-up in 6 months with my nurse practitioner or call earlier if necessary.  Management of Memory Problems  There are some general things you can do to help manage your memory problems.  Your memory may not in fact recover, but by using techniques and strategies you will be able to manage your memory difficulties better.  1)  Establish a routine.  Try to establish and then stick to a regular routine.  By doing this, you will get used to what to expect and you will reduce the need to rely on your memory.  Also, try to do things at the same time of day, such as taking your medication or checking your calendar first thing in the morning.  Think about think that you can do as a part of a regular routine and make a list.  Then enter them into a daily planner to remind you.  This will help you establish a routine.  2)  Organize your environment.  Organize your environment so that it is uncluttered.  Decrease visual stimulation.  Place everyday items such as keys or cell phone in the same place every day (ie.  Basket next to front door)  Use post it notes with a brief message to yourself (ie. Turn off light, lock the door)  Use labels to indicate where things go (ie. Which cupboards are for food, dishes, etc.)  Keep a notepad and pen by the telephone to take messages  3)  Memory Aids  A diary or journal/notebook/daily planner  Making a list (shopping list, chore list, to do list that needs to be done)  Using an alarm as a reminder  (kitchen timer or cell phone alarm)  Using cell phone to store information (Notes, Calendar, Reminders)  Calendar/White board placed in a prominent position  Post-it notes  In order for memory aids to be useful, you need to have good habits.  It's no good remembering to make a note in your journal if you don't remember to look in it.  Try setting aside a certain time of day to look in journal.  4)  Improving mood and managing fatigue.  There may be other factors that contribute to memory difficulties.  Factors, such as anxiety, depression and tiredness can affect memory.  Regular gentle exercise can help improve your mood and give you more energy.  Simple relaxation techniques may help relieve symptoms of anxiety  Try to get back to completing activities or hobbies you enjoyed doing in the past.  Learn to pace yourself through activities to decrease fatigue.  Find out about some local support groups where you can share experiences with others.  Try and achieve 7-8 hours of sleep at night.

## 2016-08-18 DIAGNOSIS — H34812 Central retinal vein occlusion, left eye, with macular edema: Secondary | ICD-10-CM | POA: Diagnosis not present

## 2016-08-25 DIAGNOSIS — N400 Enlarged prostate without lower urinary tract symptoms: Secondary | ICD-10-CM | POA: Diagnosis not present

## 2016-08-26 ENCOUNTER — Other Ambulatory Visit: Payer: Self-pay

## 2016-08-26 MED ORDER — VERAPAMIL HCL ER 240 MG PO TBCR: 240.0000 mg | EXTENDED_RELEASE_TABLET | Freq: Every day | ORAL | 6 refills | Status: DC

## 2016-08-26 MED FILL — VERAPAMIL ER 240 MG TABLET: 240 | 30 days supply | Qty: 30 | Fill #0

## 2016-08-31 ENCOUNTER — Other Ambulatory Visit (HOSPITAL_COMMUNITY): Payer: Self-pay | Admitting: Nurse Practitioner

## 2016-08-31 DIAGNOSIS — K7469 Other cirrhosis of liver: Secondary | ICD-10-CM | POA: Diagnosis not present

## 2016-08-31 DIAGNOSIS — K7400 Hepatic fibrosis, unspecified: Secondary | ICD-10-CM

## 2016-09-01 DIAGNOSIS — Z125 Encounter for screening for malignant neoplasm of prostate: Secondary | ICD-10-CM | POA: Diagnosis not present

## 2016-09-01 DIAGNOSIS — N3281 Overactive bladder: Secondary | ICD-10-CM | POA: Diagnosis not present

## 2016-09-01 DIAGNOSIS — N400 Enlarged prostate without lower urinary tract symptoms: Secondary | ICD-10-CM | POA: Diagnosis not present

## 2016-09-10 ENCOUNTER — Other Ambulatory Visit: Payer: Self-pay | Admitting: Cardiology

## 2016-09-10 MED FILL — TIMOLOL 0.5% EYE DROPS: 0.5 | 90 days supply | Qty: 5 | Fill #0

## 2016-09-10 MED FILL — LIVALO 1 MG TABLET: 1 | 84 days supply | Qty: 24 | Fill #0

## 2016-09-10 NOTE — Telephone Encounter (Signed)
Rx(s) sent to pharmacy electronically.  

## 2016-09-11 ENCOUNTER — Ambulatory Visit (HOSPITAL_COMMUNITY)
Admission: RE | Admit: 2016-09-11 | Discharge: 2016-09-11 | Disposition: A | Discharge status: Home / Self Care | Payer: 59 | Source: Ambulatory Visit | Attending: Nurse Practitioner | Admitting: Nurse Practitioner

## 2016-09-11 DIAGNOSIS — N281 Cyst of kidney, acquired: Secondary | ICD-10-CM | POA: Insufficient documentation

## 2016-09-11 DIAGNOSIS — B182 Chronic viral hepatitis C: Secondary | ICD-10-CM | POA: Diagnosis not present

## 2016-09-11 DIAGNOSIS — K7400 Hepatic fibrosis, unspecified: Secondary | ICD-10-CM

## 2016-09-17 ENCOUNTER — Ambulatory Visit (HOSPITAL_COMMUNITY): Payer: 59

## 2016-09-23 MED FILL — ALFUZOSIN HCL ER 10 MG TAB: 10 | 90 days supply | Qty: 90 | Fill #2

## 2016-09-24 MED FILL — IRBESARTAN 150 MG TABLET: 150 | 90 days supply | Qty: 90 | Fill #1

## 2016-09-25 ENCOUNTER — Encounter: Payer: Self-pay | Admitting: Cardiology

## 2016-09-25 ENCOUNTER — Ambulatory Visit (INDEPENDENT_AMBULATORY_CARE_PROVIDER_SITE_OTHER): Payer: 59 | Admitting: Cardiology

## 2016-09-25 ENCOUNTER — Encounter: Payer: Self-pay | Admitting: Internal Medicine

## 2016-09-25 VITALS — BP 116/84 | HR 76 | Ht 70.0 in | Wt 175.0 lb

## 2016-09-25 DIAGNOSIS — R5383 Other fatigue: Secondary | ICD-10-CM

## 2016-09-25 DIAGNOSIS — I700 Atherosclerosis of aorta: Secondary | ICD-10-CM | POA: Diagnosis not present

## 2016-09-25 DIAGNOSIS — I10 Essential (primary) hypertension: Secondary | ICD-10-CM | POA: Diagnosis not present

## 2016-09-25 DIAGNOSIS — E785 Hyperlipidemia, unspecified: Secondary | ICD-10-CM

## 2016-09-25 DIAGNOSIS — Z8249 Family history of ischemic heart disease and other diseases of the circulatory system: Secondary | ICD-10-CM

## 2016-09-25 DIAGNOSIS — H401132 Primary open-angle glaucoma, bilateral, moderate stage: Secondary | ICD-10-CM | POA: Diagnosis not present

## 2016-09-25 DIAGNOSIS — Z961 Presence of intraocular lens: Secondary | ICD-10-CM | POA: Diagnosis not present

## 2016-09-25 NOTE — Patient Instructions (Signed)
No changes with current medications  Need labs- CMP, Lipids  Do not eat or drink the morning of the test.  Labs can be done at [facility]. [place]- first floor    Continue to exercise  Your physician wants you to follow-up in: 12 months with Dr. Ellyn Hack. You will receive a reminder letter in the mail two months in advance. If you don't receive a letter, please call our office to schedule the follow-up appointment.

## 2016-09-25 NOTE — Progress Notes (Signed)
PCP: Wyatt Haste, MD  Clinic Note: Chief Complaint  Patient presents with  . Follow-up    8 months - cardiac risk factors of hypertension, hyperlipidemia and nonobstructive CAD    HPI: [name] is a 66 y.o. male with a PMH below who presents today for follow-up evaluation. He is a former patient of Dr. Myrtice Lauth then he saw Dr. Rollene Fare until he retired.  He is being followed for cardiac risk factors of hypertension, hyperlipidemia and had a catheterization performed in 2005 for chest discomfort which only showed minor coronary disease. He also negative Myoview in 2011. He was last seen by Dr. Rollene Fare in June 2014 following a trip to [place] with his wife & I first saw him in December 2014.  [name] was last seen on 02/13/2016 following an episode of chest pain relieved with nitroglycerin. When I saw him he is not having any further symptoms.  Recent Hospitalizations: None  Studies Reviewed: No new studies   Interval History: [name] presents today doing fairly well overall from a cardiac standpoint. He denies any significant cardiac symptoms. It's been over 2 years since I last saw him and he's been doing quite well. The LAD indicates is that he is not as active as he should be and is not getting as much exercise. He just has been somewhat lazy and not really up to it. The only cardiac symptom he notices occasional rare episodes of lightheadedness and dizziness if he stands up too quickly. Otherwise he does walk at work and tries to be active. With this activity he denies having any further episodes of chest pain similar to was evaluated last year. Cardiac Review of Symptoms: No chest pain or shortness of breath with rest or exertion.  No PND, orthopnea or edema.  No palpitations, weakness or syncope/near syncope. No TIA/amaurosis fugax symptoms. No claudication.  Interestingly, shortly after I left his room, he asked for me to come back in to discuss  concerns of fatigue. Difficult to determine if this is anhedonia a versus fatigue.  ROS: A comprehensive was performed. Review of Systems  HENT: Negative for nosebleeds.   Respiratory: Negative for shortness of breath.   Gastrointestinal: Negative for blood in stool, constipation and melena.  Genitourinary: Negative for hematuria.  Neurological: Positive for dizziness (Positional) and tremors (Parkinson seems to be relatively well-controlled).  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  All other systems reviewed and are negative.   Past Medical History:  Diagnosis Date  . Allergy   . Complication of anesthesia    Pt. stated he had a reaction that ended in him requiring urinary cath placement  . Diverticulosis   . Glaucoma   . Gout   . Head injury, closed, with concussion   . Hepatitis C    chronic  . HLD (hyperlipidemia)    statin intolerant (Crestor & Simvastatin) - Taking Livalo 1mg  / week  . Hypertension   . Plantar fasciitis    right  . PVD (peripheral vascular disease) ([place])    With no claudication; only mild abdominal aortic atherosclerosis noted on ultrasound.  [name] Ulcer [facility])     Past Surgical History:  Procedure Laterality Date  . CARDIAC CATHETERIZATION  2005   30% Cx. Dr. Melvern Banker  . cataract surgery Left 11/05/2014  . COLONOSCOPY    . ESOPHAGOGASTRODUODENOSCOPY N/A 05/02/2015   Procedure: ESOPHAGOGASTRODUODENOSCOPY (EGD);  Surgeon: Ronald Lobo, MD;  Location: [facility];  Service: Endoscopy;  Laterality:  N/A;  . ESOPHAGOGASTRODUODENOSCOPY  05/02/2015   no source of pt chest pain endoscopically evident. small hiatal hernia.  [name] EYE SURGERY    . MEMBRANE PEEL Left 03/14/2014   Procedure: MEMBRANE PEEL; ENDOLASER;  Surgeon: Hurman Horn, MD;  Location: [facility];  Service: Ophthalmology;  Laterality: Left;  . NM MYOVIEW LTD  2011   Neg Ischemia or infarct.  [name] NM MYOVIEW LTD  10/2015   LOW RISK. Small, fixed basal lateral  defect - likely diaphragmatic attenuation. EF 69%  . PARS PLANA VITRECTOMY Left 03/14/2014   Procedure: PARS PLANA VITRECTOMY WITH 25 GAUGE;  Surgeon: Hurman Horn, MD;  Location: [place];  Service: Ophthalmology;  Laterality: Left;  . TONSILLECTOMY    . TRANSTHORACIC ECHOCARDIOGRAM  2013   Normal EF. No significant Valve Disease  . UPPER GASTROINTESTINAL ENDOSCOPY      Current Meds  Medication Sig  . alfuzosin (UROXATRAL) 10 MG 24 hr tablet Take 10 mg by mouth at bedtime.   [name] aspirin EC 81 MG tablet Take 81 mg by mouth daily.  . carbidopa-levodopa (SINEMET IR) 25-100 MG tablet TAKE 1 AND 1/2 TABLET BY MOUTH 3 TIMES DAILY.  [name] irbesartan (AVAPRO) 150 MG tablet TAKE 1 TABLET BY MOUTH DAILY.  . Multiple Vitamin (MULTIVITAMIN WITH MINERALS) TABS tablet Take 1 tablet by mouth daily.  . nitroGLYCERIN (NITROSTAT) 0.4 MG SL tablet Place 1 tablet (0.4 mg total) under the tongue every 5 (five) minutes as needed for chest pain.  . pantoprazole (PROTONIX) 40 MG tablet Take 40 mg by mouth as needed.   . Pitavastatin Calcium (LIVALO) 1 MG TABS Take 1 tablet by mouth two times a week. <PLEASE MAKE APPOINTMENT FOR REFILLS>  . timolol (TIMOPTIC) 0.5 % ophthalmic solution Place 1 drop into both eyes daily.  . verapamil (CALAN-SR) 240 MG CR tablet Take 1 tablet (240 mg total) by mouth daily.    No Known Allergies  Social History   Social History  . Marital status: Married    Spouse name: barbra gen  . Number of children: 3  . Years of education: AS   Occupational History  . self employed [employer]   Social History Main Topics  . Smoking status: Former Smoker    Types: Cigars    Quit date: 07/27/1988  . Smokeless tobacco: Never Used  . Alcohol use 1.8 oz/week    1 Glasses of wine, 1 Cans of beer, 1 Shots of liquor per week     Comment: occasional  . Drug use: No  . Sexual activity: Yes   Other Topics Concern  . 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     Wife is retired Therapist, sports from [employer].   He walks roughly 2-3 miles in bed time at least 2-3 times a week. He quit smoking 30 years ago.    family history includes Cancer in his paternal grandfather and sister; Heart disease in his mother.  Wt Readings from Last 3 Encounters:  09/25/16 79.4 kg (175 lb)  08/11/16 79.9 kg (176 lb 3.2 oz)  07/15/16 81.6 kg (179 lb 12.8 oz)    PHYSICAL EXAM BP 116/84   Pulse 76   Ht 5\' 10"  (1.778 m)   Wt 79.4 kg (175 lb)   BMI 25.11 kg/m  General appearance: alert and oriented x3, cooperative, appears stated age, no distress and Well-nourished/well groomed. Normal mood and affect. Answers questions appropriately.  Neck: no adenopathy, no carotid bruit, no JVD, supple,  Lungs: clear to auscultation bilaterally, normal percussion bilaterally and Nonlabored, good air movement Heart: normal apical impulse, S1, S2 normal, no S3 or S4 and No R./G. 1/6 SEM at the base Abdomen: soft, non-tender; bowel sounds normal; no masses, no organomegaly Extremities: extremities normal, atraumatic, no cyanosis or edema;  Pulses: 2+ and symmetric Neurologic: Grossly normal    Adult ECG Report  Rate: 76 ;  Rhythm: normal sinus rhythm and X deviation (-37). Cannot exclude anteroseptal MI, age undetermined. Otherwise normal intervals and durations without ST and T-wave abnormalities.;   Narrative Interpretation: Stable EKG   Other studies Reviewed: Additional studies/ records that were reviewed today include:  Recent Labs:   Lab Results  Component Value Date   CHOL 150 07/02/2015   HDL 50 07/02/2015   LDLCALC 93 07/02/2015   TRIG 37 07/02/2015   CHOLHDL 3.0 07/02/2015    ASSESSMENT / PLAN: Problem List Items Addressed This Visit    Abdominal aortic atherosclerosis ([place]) - Primary (Chronic)    No evidence of claudication. No documentation to suggest aneurysmal dilation or obstructive disease. Simply noted on imaging  studies.  Treat cardiac risk factors.      Relevant Orders   EKG 12-Lead   Comprehensive Metabolic Panel (CMET)   Lipid Profile   CBC   TSH   Family history of heart disease in male family member before age 62 (Chronic)    We are monitoring closely for his cardiac risk factors of hypertension and hyperlipidemia. Both seem to be radically control. I encouraged him to stay physically active to maintain current weight is little activity. This will help [place] determine if he is actually having cardiac symptoms. Has had no acute disease noted on cardiac cath and negative stress test since.      Relevant Orders   EKG 12-Lead   Comprehensive Metabolic Panel (CMET)   Lipid Profile   Fatigue    Seems to better previous issue for him. He does have diverticulosis but has not had any evidence of GI bleed. We'll check TSH and CBC. Could consider backing down verapamil dose, but only if symptoms are worrisome to him.      Relevant Orders   CBC   TSH   Hyperlipidemia with target LDL less than 100 (Chronic)    Has tried both Crestor and simvastatin. Seems to be tolerating Livalo at present (but only taking 2 times a week. He is due for lab check now.      Relevant Orders   EKG 12-Lead   Comprehensive Metabolic Panel (CMET)   Lipid Profile   Moderate essential hypertension (Chronic)    Excellent control with current medications including Avapro and verapamil. No longer on HCTZ or Imdur.      Relevant Orders   EKG 12-Lead   Comprehensive Metabolic Panel (CMET)   Lipid Profile   CBC   TSH      Current medicines are reviewed at length with the patient today. (+/- concerns) None The following changes have been made: None  Patient Instructions  No changes with current medications  Need labs- CMP, Lipids  Do not eat or drink the morning of the test.  Labs can be done at [place]. [place]- first floor    Continue to exercise  Your physician wants you to follow-up  in: 12 months with Dr. Ellyn Hack. You will receive a reminder letter in the mail two months in advance. If you  don't receive a letter, please call our office to schedule the follow-up appointment.    Studies Ordered:   Orders Placed This Encounter  Procedures  . Comprehensive Metabolic Panel (CMET)  . Lipid Profile  . CBC  . TSH  . EKG 12-Lead      Glenetta Hew, M.D., M.S. Interventional Cardiologist   Pager # [phone] Phone # [phone] [address] [place], [place] [address]

## 2016-09-27 ENCOUNTER — Encounter: Payer: Self-pay | Admitting: Cardiology

## 2016-09-27 NOTE — Assessment & Plan Note (Signed)
Seems to better previous issue for him. He does have diverticulosis but has not had any evidence of GI bleed. We'll check TSH and CBC. Could consider backing down verapamil dose, but only if symptoms are worrisome to him.

## 2016-09-27 NOTE — Assessment & Plan Note (Signed)
Has tried both Crestor and simvastatin. Seems to be tolerating Livalo at present (but only taking 2 times a week. He is due for lab check now.

## 2016-09-27 NOTE — Assessment & Plan Note (Signed)
Excellent control with current medications including Avapro and verapamil. No longer on HCTZ or Imdur.

## 2016-09-27 NOTE — Assessment & Plan Note (Signed)
We are monitoring closely for his cardiac risk factors of hypertension and hyperlipidemia. Both seem to be radically control. I encouraged him to stay physically active to maintain current weight is little activity. This will help [place] determine if he is actually having cardiac symptoms. Has had no acute disease noted on cardiac cath and negative stress test since.

## 2016-09-27 NOTE — Assessment & Plan Note (Signed)
No evidence of claudication. No documentation to suggest aneurysmal dilation or obstructive disease. Simply noted on imaging studies.  Treat cardiac risk factors.

## 2016-09-28 DIAGNOSIS — I10 Essential (primary) hypertension: Secondary | ICD-10-CM | POA: Diagnosis not present

## 2016-09-28 DIAGNOSIS — Z8249 Family history of ischemic heart disease and other diseases of the circulatory system: Secondary | ICD-10-CM | POA: Diagnosis not present

## 2016-09-28 DIAGNOSIS — I700 Atherosclerosis of aorta: Secondary | ICD-10-CM | POA: Diagnosis not present

## 2016-09-28 DIAGNOSIS — E785 Hyperlipidemia, unspecified: Secondary | ICD-10-CM | POA: Diagnosis not present

## 2016-09-28 DIAGNOSIS — R5383 Other fatigue: Secondary | ICD-10-CM | POA: Diagnosis not present

## 2016-09-29 DIAGNOSIS — H34812 Central retinal vein occlusion, left eye, with macular edema: Secondary | ICD-10-CM | POA: Diagnosis not present

## 2016-09-29 LAB — COMPREHENSIVE METABOLIC PANEL
ALK PHOS: 74 IU/L (ref 39–117)
ALT: 15 IU/L (ref 0–44)
AST: 15 IU/L (ref 0–40)
Albumin/Globulin Ratio: 1.6 (ref 1.2–2.2)
Albumin: 4.5 g/dL (ref 3.6–4.8)
BILIRUBIN TOTAL: 0.6 mg/dL (ref 0.0–1.2)
BUN/Creatinine Ratio: 13 (ref 10–24)
BUN: 17 mg/dL (ref 8–27)
CHLORIDE: 102 mmol/L (ref 96–106)
CO2: 24 mmol/L (ref 18–29)
Calcium: 9.3 mg/dL (ref 8.6–10.2)
Creatinine, Ser: 1.31 mg/dL — ABNORMAL HIGH (ref 0.76–1.27)
GFR calc Af Amer: 65 mL/min/{1.73_m2} (ref >59)
GFR calc non Af Amer: 56 mL/min/{1.73_m2} — ABNORMAL LOW (ref >59)
GLUCOSE: 93 mg/dL (ref 65–99)
Globulin, Total: 2.9 g/dL (ref 1.5–4.5)
Potassium: 4.5 mmol/L (ref 3.5–5.2)
Sodium: 139 mmol/L (ref 134–144)
Total Protein: 7.4 g/dL (ref 6.0–8.5)

## 2016-09-29 LAB — TSH: TSH: 1.95 u[IU]/mL (ref 0.450–4.500)

## 2016-09-29 LAB — CBC
Hematocrit: 41.6 % (ref 37.5–51.0)
Hemoglobin: 14.6 g/dL (ref 13.0–17.7)
MCH: 30.8 pg (ref 26.6–33.0)
MCHC: 35.1 g/dL (ref 31.5–35.7)
MCV: 88 fL (ref 79–97)
PLATELETS: 263 10*3/uL (ref 150–379)
RBC: 4.74 x10E6/uL (ref 4.14–5.80)
RDW: 12.2 % — ABNORMAL LOW (ref 12.3–15.4)
WBC: 4.1 10*3/uL (ref 3.4–10.8)

## 2016-09-29 LAB — LIPID PANEL
CHOLESTEROL TOTAL: 153 mg/dL (ref 100–199)
Chol/HDL Ratio: 2.9 ratio units (ref 0.0–5.0)
HDL: 52 mg/dL (ref >39)
LDL Calculated: 94 mg/dL (ref 0–99)
Triglycerides: 36 mg/dL (ref 0–149)
VLDL CHOLESTEROL CAL: 7 mg/dL (ref 5–40)

## 2016-09-30 MED FILL — VERAPAMIL ER 240 MG TABLET: 240 | 90 days supply | Qty: 90 | Fill #1

## 2016-10-06 ENCOUNTER — Ambulatory Visit
Admission: RE | Admit: 2016-10-06 | Discharge: 2016-10-06 | Disposition: A | Discharge status: Home / Self Care | Payer: 59 | Source: Ambulatory Visit | Attending: Family Medicine | Admitting: Family Medicine

## 2016-10-06 ENCOUNTER — Ambulatory Visit (INDEPENDENT_AMBULATORY_CARE_PROVIDER_SITE_OTHER): Payer: 59 | Admitting: Family Medicine

## 2016-10-06 VITALS — BP 124/70 | HR 77 | Wt 177.8 lb

## 2016-10-06 DIAGNOSIS — M461 Sacroiliitis, not elsewhere classified: Secondary | ICD-10-CM | POA: Diagnosis not present

## 2016-10-06 DIAGNOSIS — M545 Low back pain: Secondary | ICD-10-CM | POA: Diagnosis not present

## 2016-10-06 NOTE — Progress Notes (Signed)
   Subjective:    Patient ID: [name], male    DOB: 1951/07/02, 66 y.o.   MRN: [id number]  HPI He is here for consult concerning continued difficulty with right sided back pain. He also continues to have right foot discomfort and apparently at one point was scheduled to have surgery. He is also been seen in the sports medicine clinic and some temporary orthotics were given however he did not follow-up on that.   Review of Systems     Objective:   Physical Exam Alert and in no distress. Slight tenderness palpation over the right SI joint. Normal lumbar motion and curve. Corky Sox testing was positive. Normal hip motion. Negative straight leg raising. Right foot exam does show some slight tenderness to palpation in the second metatarsal area. No swelling or fullness was noted.       Assessment & Plan:  Sacroiliitis ([place]) - Plan: DG Lumbar Spine 2-3 Views, Ambulatory referral to Physical Therapy I will get x-rays to make sure there is no bony abnormalities and is in for physical therapy. In terms of the foot, we discussed surgery versus orthotics. I did leave a message for Dr. Sharol Given to call him. Also recommend that we fully exhaust all conservative care before we consider surgery and therefore recommended he go back to sports medicine clinic to get fitted for more proper orthotic before we give up on that.

## 2016-10-08 ENCOUNTER — Ambulatory Visit
Admission: RE | Admit: 2016-10-08 | Discharge: 2016-10-08 | Disposition: A | Discharge status: Home / Self Care | Payer: 59 | Source: Ambulatory Visit | Attending: Family Medicine | Admitting: Family Medicine

## 2016-10-08 DIAGNOSIS — R918 Other nonspecific abnormal finding of lung field: Secondary | ICD-10-CM

## 2016-10-08 NOTE — Progress Notes (Signed)
Overall, labs look pretty good. Kidney function is a little bit down from before. Recommend increase hydration. - Let's recheck BMP in a month or so. Cholesterol level is stable from last year not improved. Stable CBC (blood counts) Normal thyroid function  Glenetta Hew, MD

## 2016-10-09 ENCOUNTER — Telehealth: Payer: Self-pay | Admitting: *Deleted

## 2016-10-09 ENCOUNTER — Other Ambulatory Visit: Payer: Self-pay

## 2016-10-09 DIAGNOSIS — Z79899 Other long term (current) drug therapy: Secondary | ICD-10-CM

## 2016-10-09 DIAGNOSIS — R7989 Other specified abnormal findings of blood chemistry: Secondary | ICD-10-CM

## 2016-10-09 DIAGNOSIS — R911 Solitary pulmonary nodule: Secondary | ICD-10-CM

## 2016-10-09 DIAGNOSIS — R899 Unspecified abnormal finding in specimens from other organs, systems and tissues: Secondary | ICD-10-CM | POA: Insufficient documentation

## 2016-10-09 NOTE — Telephone Encounter (Signed)
Spoke to patient. lab Result given . Verbalized understanding Aware need bmp in 1 month . Mailed labslip - [facility]

## 2016-10-09 NOTE — Telephone Encounter (Signed)
-----   Message from [name], MD sent at 10/08/2016  1:51 PM EDT ----- Overall, labs look pretty good. Kidney function is a little bit down from before. Recommend increase hydration. - Let's recheck BMP in a month or so. Cholesterol level is stable from last year not improved. Stable CBC (blood counts) Normal thyroid function  Glenetta Hew, MD

## 2016-10-13 ENCOUNTER — Ambulatory Visit: Payer: 59 | Attending: Family Medicine | Admitting: Physical Therapy

## 2016-10-13 ENCOUNTER — Encounter: Payer: Self-pay | Admitting: Physical Therapy

## 2016-10-13 DIAGNOSIS — M25651 Stiffness of right hip, not elsewhere classified: Secondary | ICD-10-CM | POA: Diagnosis not present

## 2016-10-13 DIAGNOSIS — M5417 Radiculopathy, lumbosacral region: Secondary | ICD-10-CM | POA: Diagnosis not present

## 2016-10-13 NOTE — Patient Instructions (Signed)
Lumbar Rotation (Non-Weight Bearing)    Feet on floor, slowly rock knees from side to side in small, pain-free range of motion. Allow lower back to rotate slightly. Repeat __10__ times per set. Do __1-2__ sets per session. Do __2__ sessions per day.  http://orth.exer.us/160   Copyright  VHI. All rights reserved.    Piriformis Stretch    Lying on back, pull right knee toward opposite shoulder. Hold ___30_ seconds. Repeat _3-5___ times. Do _2___ sessions per day.  http://gt2.exer.us/257    Copyright  VHI. All rights reserved.  Hamstring: Towel Stretch (Supine)    Lie on back. Loop towel around left foot, hip and knee at 90. Straighten knee and pull foot toward body. Hold _30__ seconds. Relax. Repeat _3__ times. Do _2__ times a day. Repeat with other leg.    Copyright  VHI. All rights reserved.   Side Waist Stretch from Child's Pose    From child's pose, walk hands to left. Reach right hand out on diagonal. Reach hips back toward heels making a C with torso. Breathe into right side waist. Hold for __3__ breaths. Repeat __3__ times each side.  Copyright  VHI. All rights reserved.

## 2016-10-13 NOTE — Addendum Note (Signed)
Addended by: Raeford Razor L on: 10/13/2016 01:32 PM   Modules accepted: Orders

## 2016-10-13 NOTE — Therapy (Signed)
[place], [place], [address] Phone: [phone]   Fax:  [phone]  Physical Therapy Evaluation  Patient Details  Name: [name] MRN: [id number] Date of Birth: May 18, 1951 Referring Provider: Dr. Jill Alexanders  Encounter Date: 10/13/2016      PT End of Session - 10/13/16 0856    Visit Number 1   Number of Visits 12   Date for PT Re-Evaluation 11/24/16   PT Start Time 1941  pt late   PT Stop Time 0940   PT Time Calculation (min) 43 min   Activity Tolerance Patient tolerated treatment well   Behavior During Therapy [facility] for tasks assessed/performed      Past Medical History:  Diagnosis Date  . Allergy   . Complication of anesthesia    Pt. stated he had a reaction that ended in him requiring urinary cath placement  . Diverticulosis   . Glaucoma   . Gout   . Head injury, closed, with concussion   . Hepatitis C    chronic  . HLD (hyperlipidemia)    statin intolerant (Crestor & Simvastatin) - Taking Livalo 1mg  / week  . Hypertension   . Plantar fasciitis    right  . PVD (peripheral vascular disease) ([place])    With no claudication; only mild abdominal aortic atherosclerosis noted on ultrasound.  [name] Ulcer [facility])     Past Surgical History:  Procedure Laterality Date  . CARDIAC CATHETERIZATION  2005   30% Cx. Dr. Melvern Banker  . cataract surgery Left 11/05/2014  . COLONOSCOPY    . ESOPHAGOGASTRODUODENOSCOPY N/A 05/02/2015   Procedure: ESOPHAGOGASTRODUODENOSCOPY (EGD);  Surgeon: Ronald Lobo, MD;  Location: [facility];  Service: Endoscopy;  Laterality: N/A;  . ESOPHAGOGASTRODUODENOSCOPY  05/02/2015   no source of pt chest pain endoscopically evident. small hiatal hernia.  [name] EYE SURGERY    . MEMBRANE PEEL Left 03/14/2014   Procedure: MEMBRANE PEEL; ENDOLASER;  Surgeon: Hurman Horn, MD;  Location: [facility];  Service: Ophthalmology;  Laterality: Left;  . NM MYOVIEW LTD  2011   Neg Ischemia or  infarct.  [name] NM MYOVIEW LTD  10/2015   LOW RISK. Small, fixed basal lateral defect - likely diaphragmatic attenuation. EF 69%  . PARS PLANA VITRECTOMY Left 03/14/2014   Procedure: PARS PLANA VITRECTOMY WITH 25 GAUGE;  Surgeon: Hurman Horn, MD;  Location: [facility];  Service: Ophthalmology;  Laterality: Left;  . TONSILLECTOMY    . TRANSTHORACIC ECHOCARDIOGRAM  2013   Normal EF. No significant Valve Disease  . UPPER GASTROINTESTINAL ENDOSCOPY      There were no vitals filed for this visit.       Subjective Assessment - 10/13/16 0855    Subjective Patient presents with low back pain and R. sided buttock pain for about 6 mos, progressively worse pain.  He has difficulty in the nighttime hours, worsens throughout the day. Hard to turn over and get out of bed in the night. Pain radiates into Rt. Leg, does not have good balance to don pants. and stumbles at times.     Pertinent History resting tremor, Rt. foot pain, HTN   Limitations Other (comment);Standing;Walking  sleep   Diagnostic tests XR done Lumbar spine 09/2016 Moderately severe disc space narrowing L5-S1   Patient Stated Goals less pain    Currently in Pain? Yes   Pain Score 1   discomfort  in lumbar ,can be 7/10 at night    Pain Location Back  Pain Orientation Right   Pain Descriptors / Indicators Other (Comment);Aching;Tingling  hurt , deep   Pain Type Chronic pain   Pain Radiating Towards Rt. leg    Pain Onset More than a month ago   Pain Frequency Intermittent   Aggravating Factors  evening, early in AM    Pain Relieving Factors stretching, gentle mobility, occ Ibuprofen    Effect of Pain on Daily Activities limits comfort in his daily activties.  Has to be careful with  bending             [facility] PT Assessment - 10/13/16 0909      Assessment   Medical Diagnosis sacroilitis   Referring Provider Dr. Jill Alexanders   Onset Date/Surgical Date --  6 mos    Next MD Visit unknown   Prior Therapy long ago       Precautions   Precautions None     Restrictions   Weight Bearing Restrictions No     Balance Screen   Has the patient fallen in the past 6 months No  3 yrs ago    Has the patient had a decrease in activity level because of a fear of falling?  Yes   Is the patient reluctant to leave their home because of a fear of falling?  Yes  cautious of balance , has vision issues     [place] residence   Living Arrangements Spouse/significant other   [place] of [place] to enter   Entrance Stairs-Number of Steps 3   Home Layout Two level     Prior Function   Vocation Full time employment   Vocation Requirements substance abuse counselor    Leisure as much as he can, music and art sports      Cognition   Overall Cognitive Status Within Functional Limits for tasks assessed     Observation/Other Assessments   Focus on Therapeutic Outcomes (FOTO)  53%     Sensation   Light Touch Appears Intact     Posture/Postural Control   Posture Comments high Rt. pelvis      AROM   Lumbar Flexion pain Rt. SI, WFL   Lumbar Extension 25% pain Rt. SI    Lumbar - Right Side Bend pain end range   Lumbar - Left Side Bend no pain   Lumbar - Right Rotation no pain    Lumbar - Left Rotation no pain      Strength   Right/Left Hip --  WNL   Right/Left Knee --  WNL   Right/Left Ankle --  WNL      Palpation   Spinal mobility good lumbar mobility    SI assessment  pain L5-S1 and into gluteal mm R.t side    Palpation comment TTP throughout Rt. lateral hip and glute   Rt. QL hypertonic and painful                    [facility] Adult PT Treatment/Exercise - 10/13/16 0909      Lumbar Exercises: Stretches   Lower Trunk Rotation 5 reps   Lower Trunk Rotation Limitations HEP    Quad Stretch 1 rep   Quad Stretch Limitations quadruped HEP    ITB Stretch 1 rep   ITB Stretch Limitations sidelying for Rt. QL   HEP      Knee/Hip Exercises:  Stretches   Active Hamstring Stretch Right;1 rep   Active Hamstring Stretch  Limitations HEP   Piriformis Stretch Right;1 rep   Piriformis Stretch Limitations HEP      Moist Heat Therapy   Number Minutes Moist Heat 10 Minutes   Moist Heat Location Lumbar Spine;Hip  R                PT Education - 10/13/16 0856    Education provided Yes   Education Details PT/POC, HEP and anatomy    Person(s) Educated Patient   Methods Explanation;Demonstration   Comprehension Verbalized understanding;Returned demonstration          PT Short Term Goals - 10/13/16 1323      PT SHORT TERM GOAL #1   Title Pt will complete Balance screen and set goal.    Time 3   Period Weeks   Status New     PT SHORT TERM GOAL #2   Title Pt will be I with HEP (initial) for ROM and core , posture    Time 2   Period Weeks   Status New           PT Long Term Goals - 10/13/16 1325      PT LONG TERM GOAL #1   Title Pt will be I with more advanced HEP for core and trunk flexibility.    Time 6   Period Weeks   Status New     PT LONG TERM GOAL #2   Title Berg Balance score goal TBA    Time 6   Period Weeks   Status New     PT LONG TERM GOAL #3   Title Pt will be able to get in and out of bed at night with min stiffness, pain in back and Rt hip. (<4/10)    Time 6   Period Weeks   Status New     PT LONG TERM GOAL #4   Title Pt will be able to equalize Rt. hip flexibility (hamstring) within 10 deg for optimal alignment and functional mobility.    Time 6   Period Weeks   Status New     PT LONG TERM GOAL #5   Title FOTO score will improve to <40% limited to demo improvement.    Time 6   Period Weeks   Status New               Plan - 10/13/16 1317    Clinical Impression Statement Patient presents for low complexity eval of Rt. sided lumbosacral pain which has become chronic.  He has no associated LE weakness but does have occasional radiation of pain into Rt.  thigh. He is  fairly active, is able to do his normal activities but notices progressive pain throughout the day and into the night in particular.  He should do well with PT for education and corrective exercises.  Will check balance as he identifies as an issue.     Rehab Potential Excellent   PT Frequency 2x / week   PT Duration 6 weeks   PT Treatment/Interventions ADLs/Self Care Home Management;Moist Heat;Therapeutic activities;Traction;Dry needling;Balance training;Manual techniques;Ultrasound;Therapeutic exercise;Taping;Cryotherapy;Electrical Stimulation;Iontophoresis 4mg /ml Dexamethasone;Functional mobility training;Patient/family education;Passive range of motion   PT Next Visit Plan check HEP, manual to Rt. QL and Rt. glute/piriformis    PT Home Exercise Plan piriformis stretch, hamstring, childs pose, LTR    Consulted and Agree with Plan of Care Patient      Patient will benefit from skilled therapeutic intervention in order to improve the following deficits and impairments:  Decreased range  of motion, Increased fascial restricitons, Pain, Decreased balance, Impaired flexibility, Postural dysfunction, Decreased strength, Decreased mobility  Visit Diagnosis: Stiffness of right hip, not elsewhere classified  Radiculopathy, lumbosacral region     Problem List Patient Active Problem List   Diagnosis Date Noted  . Abnormal laboratory test 10/09/2016  . Medication management 10/09/2016  . Plantar fasciitis of right foot 02/19/2015  . Depression 08/14/2014  . Family history of heart disease in male family member before age 58 04/26/2014  . Mild cognitive impairment 03/13/2014  . Tremor of both hands 02/12/2014  . 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 Fatigue 09/27/2013  . Hyperlipidemia with target LDL less than 100   . Abdominal aortic atherosclerosis ([place])   . Post-viral cough syndrome 08/20/2011  . Moderate essential hypertension 02/25/2011  . Arthropathy 02/25/2011  . HAV (hallux  abducto valgus) 02/25/2011    [name] Bolander 10/13/2016, 1:30 PM  [facility] [address] [place], [place], [address] Phone: [phone]   Fax:  [phone]  Name: DAESHON GRAMMATICO MRN: [id number] Date of Birth: Nov 20, 1950   Raeford Razor, PT 10/13/16 1:31 PM Phone: [phone] Fax: [phone]

## 2016-10-14 ENCOUNTER — Telehealth (INDEPENDENT_AMBULATORY_CARE_PROVIDER_SITE_OTHER): Payer: Self-pay | Admitting: Orthopedic Surgery

## 2016-10-14 NOTE — Telephone Encounter (Signed)
PATIENT CALLED WANTING HIS SRS RECORDS BE SENT TO ANOTHER OFFICE. I ADVISED HE NEEDS TO COMPLETE MEDICAL RECORDS RELEASE FORM AND HE SAID HE WILL COME BY TO TAKE CARE OF THAT

## 2016-10-16 ENCOUNTER — Ambulatory Visit: Payer: 59 | Admitting: Physical Therapy

## 2016-10-16 DIAGNOSIS — M5417 Radiculopathy, lumbosacral region: Secondary | ICD-10-CM | POA: Diagnosis not present

## 2016-10-16 DIAGNOSIS — M25651 Stiffness of right hip, not elsewhere classified: Secondary | ICD-10-CM | POA: Diagnosis not present

## 2016-10-16 NOTE — Therapy (Addendum)
[place], [place], [address] Phone: [phone]   Fax:  [phone]  Physical Therapy Treatment  Patient Details  Name: [name] MRN: [id number] Date of Birth: Nov 12, 1950 Referring Provider: Dr. Jill Alexanders  Encounter Date: 10/16/2016      PT End of Session - 10/16/16 1021    Visit Number 2   Number of Visits 12   Date for PT Re-Evaluation 11/24/16   PT Start Time 1016   PT Stop Time 1110   PT Time Calculation (min) 54 min      Past Medical History:  Diagnosis Date  . Allergy   . Complication of anesthesia    Pt. stated he had a reaction that ended in him requiring urinary cath placement  . Diverticulosis   . Glaucoma   . Gout   . Head injury, closed, with concussion   . Hepatitis C    chronic  . HLD (hyperlipidemia)    statin intolerant (Crestor & Simvastatin) - Taking Livalo 1mg  / week  . Hypertension   . Plantar fasciitis    right  . PVD (peripheral vascular disease) ([place])    With no claudication; only mild abdominal aortic atherosclerosis noted on ultrasound.  [name] Ulcer [facility])     Past Surgical History:  Procedure Laterality Date  . CARDIAC CATHETERIZATION  2005   30% Cx. Dr. Melvern Banker  . cataract surgery Left 11/05/2014  . COLONOSCOPY    . ESOPHAGOGASTRODUODENOSCOPY N/A 05/02/2015   Procedure: ESOPHAGOGASTRODUODENOSCOPY (EGD);  Surgeon: Ronald Lobo, MD;  [facility];  Service: Endoscopy;  Laterality: N/A;  . ESOPHAGOGASTRODUODENOSCOPY  05/02/2015   no source of pt chest pain endoscopically evident. small hiatal hernia.  [name] EYE SURGERY    . MEMBRANE PEEL Left 03/14/2014   Procedure: MEMBRANE PEEL; ENDOLASER;  Surgeon: Hurman Horn, MD;  Location: [facility];  Service: Ophthalmology;  Laterality: Left;  . NM MYOVIEW LTD  2011   Neg Ischemia or infarct.  [name] NM MYOVIEW LTD  10/2015   LOW RISK. Small, fixed basal lateral defect - likely diaphragmatic attenuation. EF 69%  .  PARS PLANA VITRECTOMY Left 03/14/2014   Procedure: PARS PLANA VITRECTOMY WITH 25 GAUGE;  Surgeon: Hurman Horn, MD;  Location: [facility];  Service: Ophthalmology;  Laterality: Left;  . TONSILLECTOMY    . TRANSTHORACIC ECHOCARDIOGRAM  2013   Normal EF. No significant Valve Disease  . UPPER GASTROINTESTINAL ENDOSCOPY      There were no vitals filed for this visit.      Subjective Assessment - 10/16/16 1019    Subjective I have very little pain after I get up and moving. Pain is at night and morning. Up to 8/10   Currently in Pain? Yes   Pain Score 1    Pain Location Back   Pain Orientation Right   Pain Descriptors / Indicators Aching;Tingling   Pain Radiating Towards rt buttock             [facility] PT Assessment - 10/16/16 0001      Standardized Balance Assessment   Standardized Balance Assessment Berg Balance Test     Berg Balance Test   Sit to Stand Able to stand without using hands and stabilize independently   Standing Unsupported Able to stand safely 2 minutes   Sitting with Back Unsupported but Feet Supported on Floor or Stool Able to sit safely and securely 2 minutes   Stand to Sit Sits safely with minimal use  of hands   Transfers Able to transfer safely, minor use of hands   Standing Unsupported with Eyes Closed Able to stand 10 seconds safely   Standing Ubsupported with Feet Together Able to place feet together independently and stand 1 minute safely   From Standing, Reach Forward with Outstretched Arm Can reach confidently >25 cm (10")   From Standing Position, Pick up Object from Floor Able to pick up shoe safely and easily   From Standing Position, Turn to Look Behind Over each Shoulder Looks behind from both sides and weight shifts well   Turn 360 Degrees Able to turn 360 degrees safely in 4 seconds or less   Standing Unsupported, Alternately Place Feet on Step/Stool Able to stand independently and safely and complete 8 steps in 20 seconds   Standing Unsupported, One  Foot in Front Able to place foot tandem independently and hold 30 seconds   Standing on One Leg Able to lift leg independently and hold equal to or more than 3 seconds   Total Score 54                     [facility] Adult PT Treatment/Exercise - 10/16/16 0001      Lumbar Exercises: Stretches   Lower Trunk Rotation 5 reps   Quad Stretch 2 reps   Quad Stretch Limitations quadruped HEP, also left lateral for Right QL stretch    ITB Stretch 1 rep   ITB Stretch Limitations sidelying for Rt. QL   added Towel roll, felt more QL stretch     Knee/Hip Exercises: Stretches   Active Hamstring Stretch 3 reps;30 seconds   Piriformis Stretch 3 reps;30 seconds   Piriformis Stretch Limitations also figure 4 modified.      Manual Therapy   Manual Therapy Soft tissue mobilization   Soft tissue mobilization Strumming to QL during sidelying stretch, tennis ball IASTM and TPR to right glute in sidelying                 PT Education - 10/16/16 1119    Education provided Yes   Education Details Standing side bend stretch   Person(s) Educated Patient   Methods Explanation;Handout   Comprehension Verbalized understanding          PT Short Term Goals - 10/13/16 1323      PT SHORT TERM GOAL #1   Title Pt will complete Balance screen and set goal.    Time 3   Period Weeks   Status New     PT SHORT TERM GOAL #2   Title Pt will be I with HEP (initial) for ROM and core , posture    Time 2   Period Weeks   Status New           PT Long Term Goals - 10/13/16 1325      PT LONG TERM GOAL #1   Title Pt will be I with more advanced HEP for core and trunk flexibility.    Time 6   Period Weeks   Status New     PT LONG TERM GOAL #2   Title Berg Balance score goal TBA    Time 6   Period Weeks   Status New     PT LONG TERM GOAL #3   Title Pt will be able to get in and out of bed at night with min stiffness, pain in back and Rt hip. (<4/10)    Time 6   Period Weeks  Status New     PT LONG TERM GOAL #4   Title Pt will be able to equalize Rt. hip flexibility (hamstring) within 10 deg for optimal alignment and functional mobility.    Time 6   Period Weeks   Status New     PT LONG TERM GOAL #5   Title FOTO score will improve to <40% limited to demo improvement.    Time 6   Period Weeks   Status New               Plan - 10/16/16 1021    Clinical Impression Statement Pt presents with questions about HEP. Reviewed HEP and provided cues for proper form. Added pillow roll for QL stretch and performed strumming to QL in this position. Also instructed pt in side bend stretch at wall for another option for HEP. Tennis ball and trigger point release to right glute in side lying. BERG 54/56 only issue with SLS time. May try DGI as pt reports feeling off balance.    PT Next Visit Plan check HEP, manual to Rt. QL and Rt. glute/piriformis; DGI ?    PT Home Exercise Plan piriformis stretch, hamstring, childs pose, LTR    Consulted and Agree with Plan of Care Patient      Patient will benefit from skilled therapeutic intervention in order to improve the following deficits and impairments:  Decreased range of motion, Increased fascial restricitons, Pain, Decreased balance, Impaired flexibility, Postural dysfunction, Decreased strength, Decreased mobility  Visit Diagnosis: Stiffness of right hip, not elsewhere classified  Radiculopathy, lumbosacral region     Problem List Patient Active Problem List   Diagnosis Date Noted  . Abnormal laboratory test 10/09/2016  . Medication management 10/09/2016  . Plantar fasciitis of right foot 02/19/2015  . Depression 08/14/2014  . Family history of heart disease in male family member before age 38 04/26/2014  . Mild cognitive impairment 03/13/2014  . Tremor of both hands 02/12/2014  . 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 Fatigue 09/27/2013  . Hyperlipidemia with target LDL less than 100   . Abdominal  aortic atherosclerosis ([place])   . Post-viral cough syndrome 08/20/2011  . Moderate essential hypertension 02/25/2011  . Arthropathy 02/25/2011  . HAV (hallux abducto valgus) 02/25/2011    Dorene Ar, PTA 10/16/2016, 11:19 AM  [place], [place], [address] Phone: [phone]   Fax:  [phone]  Name: AQUAN KOPE MRN: [id number] Date of Birth: [birth year]

## 2016-10-19 ENCOUNTER — Ambulatory Visit: Payer: 59 | Admitting: Physical Therapy

## 2016-10-19 DIAGNOSIS — M25651 Stiffness of right hip, not elsewhere classified: Secondary | ICD-10-CM

## 2016-10-19 DIAGNOSIS — M5417 Radiculopathy, lumbosacral region: Secondary | ICD-10-CM | POA: Diagnosis not present

## 2016-10-19 NOTE — Therapy (Signed)
[place], [place], [address] Phone: [phone]   Fax:  [phone]  Physical Therapy Treatment  Patient Details  Name: [name] MRN: [id number] Date of Birth: 13-Feb-1951 Referring Provider: Dr. Jill Alexanders  Encounter Date: 10/19/2016      PT End of Session - 10/19/16 0900    Visit Number 3   Number of Visits 12   Date for PT Re-Evaluation 11/24/16   PT Start Time 0833   PT Stop Time 0927   PT Time Calculation (min) 54 min   Activity Tolerance Patient tolerated treatment well   Behavior During Therapy [facility] for tasks assessed/performed      Past Medical History:  Diagnosis Date  . Allergy   . Complication of anesthesia    Pt. stated he had a reaction that ended in him requiring urinary cath placement  . Diverticulosis   . Glaucoma   . Gout   . Head injury, closed, with concussion   . Hepatitis C    chronic  . HLD (hyperlipidemia)    statin intolerant (Crestor & Simvastatin) - Taking Livalo 1mg  / week  . Hypertension   . Plantar fasciitis    right  . PVD (peripheral vascular disease) ([place])    With no claudication; only mild abdominal aortic atherosclerosis noted on ultrasound.  [name] Ulcer [facility])     Past Surgical History:  Procedure Laterality Date  . CARDIAC CATHETERIZATION  2005   30% Cx. Dr. Melvern Banker  . cataract surgery Left 11/05/2014  . COLONOSCOPY    . ESOPHAGOGASTRODUODENOSCOPY N/A 05/02/2015   Procedure: ESOPHAGOGASTRODUODENOSCOPY (EGD);  Surgeon: Ronald Lobo, MD;  [facility];  Service: Endoscopy;  Laterality: N/A;  . ESOPHAGOGASTRODUODENOSCOPY  05/02/2015   no source of pt chest pain endoscopically evident. small hiatal hernia.  [name] EYE SURGERY    . MEMBRANE PEEL Left 03/14/2014   Procedure: MEMBRANE PEEL; ENDOLASER;  Surgeon: Hurman Horn, MD;  Location: [place];  Service: Ophthalmology;  Laterality: Left;  . NM MYOVIEW LTD  2011   Neg Ischemia or infarct.  [name] NM  MYOVIEW LTD  10/2015   LOW RISK. Small, fixed basal lateral defect - likely diaphragmatic attenuation. EF 69%  . PARS PLANA VITRECTOMY Left 03/14/2014   Procedure: PARS PLANA VITRECTOMY WITH 25 GAUGE;  Surgeon: Hurman Horn, MD;  Location: [place];  Service: Ophthalmology;  Laterality: Left;  . TONSILLECTOMY    . TRANSTHORACIC ECHOCARDIOGRAM  2013   Normal EF. No significant Valve Disease  . UPPER GASTROINTESTINAL ENDOSCOPY      There were no vitals filed for this visit.      Subjective Assessment - 10/19/16 0837    Subjective I did some of my exercises this weekend.  LAst session helped a little later in the day. No pain currently.    Currently in Pain? No/denies             [facility] Adult PT Treatment/Exercise - 10/19/16 0840      Lumbar Exercises: Stretches   Lower Trunk Rotation 10 seconds   Lower Trunk Rotation Limitations x 10      Lumbar Exercises: Aerobic   Stationary Bike NuStep L5 Ue and LE for 5 min warmup, no knee pain      Lumbar Exercises: Supine   Bent Knee Raise 10 reps   Bridge 10 reps   Bridge Limitations with ball      Lumbar Exercises: Quadruped   Other Quadruped Lumbar Exercises childs  pose, used ball for 1 set of each position (lateral)      Knee/Hip Exercises: Stretches   Active Hamstring Stretch Both;2 reps;30 seconds   Piriformis Stretch Both;4 reps;30 seconds   Piriformis Stretch Limitations 2 x figure 4 and x 2      Moist Heat Therapy   Number Minutes Moist Heat 8 Minutes   Moist Heat Location Lumbar Spine;Hip     Manual Therapy   Manual Therapy Soft tissue mobilization   Soft tissue mobilization Strumming to R QL during sidelying stretch and TPR to right glute in sidelying                 PT Education - 10/19/16 0859    Education provided Yes   Education Details exercise techniques    Person(s) Educated Patient   Methods Explanation;Verbal cues   Comprehension Verbalized understanding;Returned demonstration;Need further  instruction          PT Short Term Goals - 10/19/16 8338      PT SHORT TERM GOAL #1   Title Pt will complete Balance screen and set goal.    Status Achieved     PT SHORT TERM GOAL #2   Title Pt will be I with HEP (initial) for ROM and core , posture    Status Achieved           PT Long Term Goals - 10/19/16 2505      PT LONG TERM GOAL #1   Title Pt will be I with more advanced HEP for core and trunk flexibility.    Status On-going     PT LONG TERM GOAL #2   Title Berg Balance score goal TBA    Baseline may do DGI goal as Merrilee Jansky was [facility]    Status Unable to assess     PT LONG TERM GOAL #3   Title Pt will be able to get in and out of bed at night with min stiffness, pain in back and Rt hip. (<4/10)    Status On-going     PT LONG TERM GOAL #4   Title Pt will be able to equalize Rt. hip flexibility (hamstring) within 10 deg for optimal alignment and functional mobility.    Status On-going     PT LONG TERM GOAL #5   Title FOTO score will improve to <40% limited to demo improvement.    Status On-going               Plan - 10/19/16 0920    Clinical Impression Statement Patient without pain, able to do initial HEP Independently, standing QL stretch not as effective as sidelying.  Able to feel less tension in Rt lumbar/hip as manual progressed. Still with pain at night, admits to being worried and "mind racing" as a factor as well in PM hours.     PT Next Visit Plan check /progress HEP, manual to Rt. QL and Rt. glute/piriformis; DGI ?    PT Home Exercise Plan piriformis stretch, hamstring, childs pose, LTR , sidelying QL    Consulted and Agree with Plan of Care Patient      Patient will benefit from skilled therapeutic intervention in order to improve the following deficits and impairments:  Decreased range of motion, Increased fascial restricitons, Pain, Decreased balance, Impaired flexibility, Postural dysfunction, Decreased strength, Decreased mobility  Visit  Diagnosis: Stiffness of right hip, not elsewhere classified  Radiculopathy, lumbosacral region     Problem List Patient Active Problem List   Diagnosis  Date Noted  . Abnormal laboratory test 10/09/2016  . Medication management 10/09/2016  . Plantar fasciitis of right foot 02/19/2015  . Depression 08/14/2014  . Family history of heart disease in male family member before age 16 04/26/2014  . Mild cognitive impairment 03/13/2014  . Tremor of both hands 02/12/2014  . Parkinsonian tremor ([place]) 02/12/2014  . Fatigue 09/27/2013  . Hyperlipidemia with target LDL less than 100   . Abdominal aortic atherosclerosis ([place])   . Post-viral cough syndrome 08/20/2011  . Moderate essential hypertension 02/25/2011  . Arthropathy 02/25/2011  . HAV (hallux abducto valgus) 02/25/2011    Korbin Notaro 10/19/2016, 9:23 AM  [place], [place], [address] Phone: [phone]   Fax:  [phone]  Name: DRESHAUN STENE MRN: [id number] Date of Birth: 05/16/1951  Raeford Razor, PT 10/19/16 9:23 AM Phone: [phone] Fax: [phone]

## 2016-10-20 ENCOUNTER — Other Ambulatory Visit: Payer: Self-pay | Admitting: Neurology

## 2016-10-21 ENCOUNTER — Telehealth (INDEPENDENT_AMBULATORY_CARE_PROVIDER_SITE_OTHER): Payer: Self-pay | Admitting: Orthopedic Surgery

## 2016-10-21 ENCOUNTER — Ambulatory Visit: Payer: 59 | Admitting: Physical Therapy

## 2016-10-21 MED FILL — CARBIDOPA-LEVODOPA 25-100 T: 25-100 | 90 days supply | Qty: 405 | Fill #0

## 2016-10-21 NOTE — Telephone Encounter (Signed)
RECORDS FROM [facility] FAXED TO [facility] [phone] PER PTS REQUEST

## 2016-10-26 ENCOUNTER — Ambulatory Visit: Payer: 59 | Attending: Family Medicine | Admitting: Physical Therapy

## 2016-10-26 DIAGNOSIS — M5417 Radiculopathy, lumbosacral region: Secondary | ICD-10-CM | POA: Diagnosis not present

## 2016-10-26 DIAGNOSIS — M25651 Stiffness of right hip, not elsewhere classified: Secondary | ICD-10-CM | POA: Diagnosis not present

## 2016-10-26 NOTE — Therapy (Signed)
[place], [place], [address] Phone: [phone]   Fax:  [phone]  Physical Therapy Treatment  Patient Details  Name: [name] MRN: [id number] Date of Birth: Apr 12, 1951 Referring Provider: Dr. Jill Alexanders  Encounter Date: 10/26/2016      PT End of Session - 10/26/16 0824    Visit Number 4   Number of Visits 12   Date for PT Re-Evaluation 11/24/16   PT Start Time 0803   PT Stop Time 0856   PT Time Calculation (min) 53 min   Activity Tolerance Patient tolerated treatment well   Behavior During Therapy [facility] [place] [facility] for tasks assessed/performed      Past Medical History:  Diagnosis Date  . Allergy   . Complication of anesthesia    Pt. stated he had a reaction that ended in him requiring urinary cath placement  . Diverticulosis   . Glaucoma   . Gout   . Head injury, closed, with concussion   . Hepatitis C    chronic  . HLD (hyperlipidemia)    statin intolerant (Crestor & Simvastatin) - Taking Livalo 1mg  / week  . Hypertension   . Plantar fasciitis    right  . PVD (peripheral vascular disease) ([place])    With no claudication; only mild abdominal aortic atherosclerosis noted on ultrasound.  [name] Ulcer [facility])     Past Surgical History:  Procedure Laterality Date  . CARDIAC CATHETERIZATION  2005   30% Cx. Dr. Melvern Banker  . cataract surgery Left 11/05/2014  . COLONOSCOPY    . ESOPHAGOGASTRODUODENOSCOPY N/A 05/02/2015   Procedure: ESOPHAGOGASTRODUODENOSCOPY (EGD);  Surgeon: Ronald Lobo, MD;  [facility];  Service: Endoscopy;  Laterality: N/A;  . ESOPHAGOGASTRODUODENOSCOPY  05/02/2015   no source of pt chest pain endoscopically evident. small hiatal hernia.  [name] EYE SURGERY    . MEMBRANE PEEL Left 03/14/2014   Procedure: MEMBRANE PEEL; ENDOLASER;  Surgeon: Hurman Horn, MD;  Location: [place];  Service: Ophthalmology;  Laterality: Left;  . NM MYOVIEW LTD  2011   Neg Ischemia or infarct.  [name] NM  MYOVIEW LTD  10/2015   LOW RISK. Small, fixed basal lateral defect - likely diaphragmatic attenuation. EF 69%  . PARS PLANA VITRECTOMY Left 03/14/2014   Procedure: PARS PLANA VITRECTOMY WITH 25 GAUGE;  Surgeon: Hurman Horn, MD;  Location: [place];  Service: Ophthalmology;  Laterality: Left;  . TONSILLECTOMY    . TRANSTHORACIC ECHOCARDIOGRAM  2013   Normal EF. No significant Valve Disease  . UPPER GASTROINTESTINAL ENDOSCOPY      There were no vitals filed for this visit.      Subjective Assessment - 10/26/16 0806    Subjective I had a night without pain over the weekend.    Currently in Pain? No/denies                         [facility] Adult PT Treatment/Exercise - 10/26/16 0808      Lumbar Exercises: Stretches   Active Hamstring Stretch 2 reps;30 seconds   Single Knee to Chest Stretch 2 reps;30 seconds   Lower Trunk Rotation 10 seconds   Lower Trunk Rotation Limitations x 10      Lumbar Exercises: Aerobic   Stationary Bike NuStep L7 UE and LE for 5 min      Lumbar Exercises: Machines for Strengthening   Leg Press 1-2 plates up to 20 reps , cues for neutral spine  Lumbar Exercises: Standing   Functional Squats 15 reps   Functional Squats Limitations worked on hip hinge, does well but needs cues for wgt in heels      Lumbar Exercises: Supine   Bridge 10 reps   Bridge Limitations with ball    Straight Leg Raise 10 reps   Other Supine Lumbar Exercises 3 way SLR ext, flex and abduction x 10 each      Knee/Hip Exercises: Stretches   Piriformis Stretch Both;2 reps;30 seconds     Moist Heat Therapy   Number Minutes Moist Heat 10 Minutes   Moist Heat Location Lumbar Spine;Hip     Manual Therapy   Manual Therapy Soft tissue mobilization   Soft tissue mobilization QL sidelying stretch and TPR to right glute in sidelying   worked deep pressure points in superior lateral glutes                  PT Short Term Goals - 10/19/16 2119      PT SHORT  TERM GOAL #1   Title Pt will complete Balance screen and set goal.    Status Achieved     PT SHORT TERM GOAL #2   Title Pt will be I with HEP (initial) for ROM and core , posture    Status Achieved           PT Long Term Goals - 10/26/16 1003      PT LONG TERM GOAL #1   Title Pt will be I with more advanced HEP for core and trunk flexibility.    Status On-going     PT LONG TERM GOAL #2   Title Berg Balance score goal TBA    Status Deferred     PT LONG TERM GOAL #3   Title Pt will be able to get in and out of bed at night with min stiffness, pain in back and Rt hip. (<4/10)    Status On-going     PT LONG TERM GOAL #4   Title Pt will be able to equalize Rt. hip flexibility (hamstring) within 10 deg for optimal alignment and functional mobility.    Status On-going     PT LONG TERM GOAL #5   Title FOTO score will improve to <40% limited to demo improvement.    Status On-going               Plan - 10/26/16 4174    Clinical Impression Statement Patient doing well, would like to be able to get back into exercising but not motivated to do so.  He had more difficulty with R sided hip exercises (ext and abd).  Tolerates more pressure to hip and back muscles, trigger points throughout.    PT Next Visit Plan DGI, progress core and hip strength, flexibility , add in hip ex and abd    PT Home Exercise Plan piriformis stretch, hamstring, childs pose, LTR , sidelying QL    Consulted and Agree with Plan of Care Patient      Patient will benefit from skilled therapeutic intervention in order to improve the following deficits and impairments:  Decreased range of motion, Increased fascial restricitons, Pain, Decreased balance, Impaired flexibility, Postural dysfunction, Decreased strength, Decreased mobility  Visit Diagnosis: Stiffness of right hip, not elsewhere classified  Radiculopathy, lumbosacral region     Problem List Patient Active Problem List   Diagnosis Date  Noted  . Abnormal laboratory test 10/09/2016  . Medication management 10/09/2016  . Plantar fasciitis  of right foot 02/19/2015  . Depression 08/14/2014  . Family history of heart disease in male family member before age 22 04/26/2014  . Mild cognitive impairment 03/13/2014  . Tremor of both hands 02/12/2014  . 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 Fatigue 09/27/2013  . Hyperlipidemia with target LDL less than 100   . Abdominal aortic atherosclerosis ([place])   . Post-viral cough syndrome 08/20/2011  . Moderate essential hypertension 02/25/2011  . Arthropathy 02/25/2011  . HAV (hallux abducto valgus) 02/25/2011    Trent Theisen 10/26/2016, 10:08 AM  [facility] [address] [place], [place], [address] Phone: [phone]   Fax:  [phone]  Name: [name] MRN: [id number] Date of Birth: 1951/03/29  Raeford Razor, PT 10/26/16 10:08 AM Phone: [phone] Fax: [phone]

## 2016-10-27 ENCOUNTER — Ambulatory Visit (INDEPENDENT_AMBULATORY_CARE_PROVIDER_SITE_OTHER): Payer: 59 | Admitting: Sports Medicine

## 2016-10-27 ENCOUNTER — Encounter: Payer: Self-pay | Admitting: Sports Medicine

## 2016-10-27 DIAGNOSIS — M722 Plantar fascial fibromatosis: Secondary | ICD-10-CM

## 2016-10-27 DIAGNOSIS — M79671 Pain in right foot: Secondary | ICD-10-CM | POA: Diagnosis not present

## 2016-10-27 DIAGNOSIS — M79673 Pain in unspecified foot: Secondary | ICD-10-CM | POA: Insufficient documentation

## 2016-10-27 NOTE — Progress Notes (Signed)
  [name] - 66 y.o. male MRN [id number]  Date of birth: 09-15-1950  SUBJECTIVE:  Including CC & ROS.   [name] is a 66 year old male that is presenting with right foot pain and left foot numbness. He reports having acute on chronic pain in his right foot. The pain is occurring on the plantar aspect just under the second metatarsal head. He has had adjustments made to his insoles previously when he was dealing with plantar fasciitis. He has purchased a pair of over-the-counter orthotics about 6 months ago and that has improved his pain. He can only fit those orthotics in certain shoes however. He denies any injury or surgery to his right foot. The pain is worse after he walks. He is willing to avoid surgery if he can.  He is having numbness in his fourth and fifth toes. This numbness is intermittent in nature. He feels it more if he has had some prolonged walking. He denies any injury or surgery to his toes.  ROS: No unexpected weight loss, fever, chills, swelling, instability, muscle pain, redness, otherwise see HPI   HISTORY: Past Medical, Surgical, Social, and Family History Reviewed & Updated per EMR.   Pertinent Historical Findings include: PMSHx -  Parkinson's tremor, hyperlipidemia PSHx -  former smoker   PHYSICAL EXAM:  VS: BP:(!) 143/97  HR: bpm  TEMP: ( )  RESP:   HT:5\' 10"  (177.8 cm)   WT:175 lb (79.4 kg)  BMI:25.2 PHYSICAL EXAM: Gen: NAD, alert, cooperative with exam, well-appearing HEENT: clear conjunctiva, EOMI CV:  no edema, capillary refill brisk,  Resp: non-labored, normal speech Skin: no rashes, normal turgor  Neuro: no gross deficits.  Psych:  alert and oriented Feet: Obvious bunion formation on the right foot. Tenderness to palpation on the plantar aspect of the right foot under the metatarsal head. Obvious loss of transverse arch bilaterally. Normal ankle range of motion. Normal ankle strength resistance. No overlying ecchymosis or swelling. No  significant callus formation the plantar aspect bilaterally. Neurovascularly intact  ASSESSMENT & PLAN:   I spent 25 minutes with this patient, greater than 50% was face-to-face time counseling regarding the below diagnosis.   Foot pain It appears that his pain is likely related to the loss of his transverse arch. - Fitted with size 11.5-12.5 Green sport insoles. - A neuroma pad was placed on the left and a scaphoid pad with a metatarsal cookie was placed on the right - He had 2 other insoles that these adjustments were made as well. - He will try these adjustments for a month and can always follow-up to consider custom orthotics.

## 2016-10-27 NOTE — Assessment & Plan Note (Signed)
It appears that his pain is likely related to the loss of his transverse arch. - Fitted with size 11.5-12.5 Green sport insoles. - A neuroma pad was placed on the left and a scaphoid pad with a metatarsal cookie was placed on the right - He had 2 other insoles that these adjustments were made as well. - He will try these adjustments for a month and can always follow-up to consider custom orthotics.

## 2016-10-28 ENCOUNTER — Ambulatory Visit: Payer: 59 | Admitting: Physical Therapy

## 2016-10-28 ENCOUNTER — Encounter: Payer: Self-pay | Admitting: Physical Therapy

## 2016-10-28 DIAGNOSIS — M25651 Stiffness of right hip, not elsewhere classified: Secondary | ICD-10-CM | POA: Diagnosis not present

## 2016-10-28 DIAGNOSIS — M5417 Radiculopathy, lumbosacral region: Secondary | ICD-10-CM

## 2016-10-28 NOTE — Therapy (Signed)
[place], [place], [address] Phone: [phone]   Fax:  [phone]  Physical Therapy Treatment  Patient Details  Name: [name] MRN: [id number] Date of Birth: 1951/05/13 Referring Provider: Dr. Jill Alexanders  Encounter Date: 10/28/2016      PT End of Session - 10/28/16 0928    Visit Number 5   Number of Visits 12   Date for PT Re-Evaluation 11/24/16   PT Start Time 0850   PT Stop Time 0935   PT Time Calculation (min) 45 min   Activity Tolerance Patient tolerated treatment well   Behavior During Therapy [facility] for tasks assessed/performed      Past Medical History:  Diagnosis Date  . Allergy   . Complication of anesthesia    Pt. stated he had a reaction that ended in him requiring urinary cath placement  . Diverticulosis   . Glaucoma   . Gout   . Head injury, closed, with concussion   . Hepatitis C    chronic  . HLD (hyperlipidemia)    statin intolerant (Crestor & Simvastatin) - Taking Livalo 1mg  / week  . Hypertension   . Plantar fasciitis    right  . PVD (peripheral vascular disease) ([place])    With no claudication; only mild abdominal aortic atherosclerosis noted on ultrasound.  [name] Ulcer [facility])     Past Surgical History:  Procedure Laterality Date  . CARDIAC CATHETERIZATION  2005   30% Cx. Dr. Melvern Banker  . cataract surgery Left 11/05/2014  . COLONOSCOPY    . ESOPHAGOGASTRODUODENOSCOPY N/A 05/02/2015   Procedure: ESOPHAGOGASTRODUODENOSCOPY (EGD);  Surgeon: Ronald Lobo, MD;  [facility];  Service: Endoscopy;  Laterality: N/A;  . ESOPHAGOGASTRODUODENOSCOPY  05/02/2015   no source of pt chest pain endoscopically evident. small hiatal hernia.  [name] EYE SURGERY    . MEMBRANE PEEL Left 03/14/2014   Procedure: MEMBRANE PEEL; ENDOLASER;  Surgeon: Hurman Horn, MD;  Location: [place];  Service: Ophthalmology;  Laterality: Left;  . NM MYOVIEW LTD  2011   Neg Ischemia or infarct.  [name] NM  MYOVIEW LTD  10/2015   LOW RISK. Small, fixed basal lateral defect - likely diaphragmatic attenuation. EF 69%  . PARS PLANA VITRECTOMY Left 03/14/2014   Procedure: PARS PLANA VITRECTOMY WITH 25 GAUGE;  Surgeon: Hurman Horn, MD;  Location: [place];  Service: Ophthalmology;  Laterality: Left;  . TONSILLECTOMY    . TRANSTHORACIC ECHOCARDIOGRAM  2013   Normal EF. No significant Valve Disease  . UPPER GASTROINTESTINAL ENDOSCOPY      There were no vitals filed for this visit.      Subjective Assessment - 10/28/16 0854    Subjective I don't have pain.  I am getting a cold.  Over the last couple of nights,  I have had less pain.  I have had pain every day until recently.  he feels his desk chair is supportive.   Currently in Pain? No/denies   Pain Location Back   Pain Orientation Right   Pain Descriptors / Indicators Aching;Tingling   Pain Type Chronic pain   Pain Frequency Several days a week   Aggravating Factors  evening   Pain Relieving Factors stretches,  moving   Multiple Pain Sites --  Lt rotator cuff 3/10 reaching , sleeping on it wrong,.  better with shower  [place] Adult PT Treatment/Exercise - 10/28/16 0001      Self-Care   Self-Care ADL's;Posture   ADL's Handout reviewed,  lifting demo golfer's lift,   Posture Sleeping posture pillows between knees.,  desk     Lumbar Exercises: Stretches   Active Hamstring Stretch 3 reps;30 seconds  with strap, both  both WNL    Single Knee to Chest Stretch 2 reps;30 seconds   Lower Trunk Rotation --  5 seconds 10 x     Lumbar Exercises: Aerobic   Stationary Bike NuStep L7 UE and LE for 5 min   270 steps     Lumbar Exercises: Standing   Wall Slides 10 reps     Lumbar Exercises: Supine   Bridge 10 reps   Bridge Limitations with ball 1 set of 10     Knee/Hip Exercises: Stretches   Gastroc Stretch 3 reps;30 seconds  Both on incline board     Moist Heat Therapy   Number Minutes Moist Heat  2 Minutes   Moist Heat Location Lumbar Spine                PT Education - 10/28/16 0921    Education Details Self Care ADL posture   Person(s) Educated Patient   Methods Explanation;Demonstration;Handout   Comprehension Verbalized understanding          PT Short Term Goals - 10/19/16 1610      PT SHORT TERM GOAL #1   Title Pt will complete Balance screen and set goal.    Status Achieved     PT SHORT TERM GOAL #2   Title Pt will be I with HEP (initial) for ROM and core , posture    Status Achieved           PT Long Term Goals - 10/28/16 0940      PT LONG TERM GOAL #1   Title Pt will be I with more advanced HEP for core and trunk flexibility.    Baseline independent with exercises so far   Time 6   Period Weeks   Status On-going     PT LONG TERM GOAL #3   Title Pt will be able to get in and out of bed at night with min stiffness, pain in back and Rt hip. (<4/10)    Baseline stiffness continues,  pain not for last 2 nights.    Time 6   Period Weeks   Status On-going     PT LONG TERM GOAL #4   Title Pt will be able to equalize Rt. hip flexibility (hamstring) within 10 deg for optimal alignment and functional mobility.    Baseline hamstrings WNL   Time 6   Period Weeks   Status On-going     PT LONG TERM GOAL #5   Title FOTO score will improve to <40% limited to demo improvement.    Time 6   Period Weeks   Status Unable to assess               Plan - 10/28/16 0929    Clinical Impression Statement hamstrings WNL.  Progress toward pain goals.  Mild pain increase with exercise.  "It's just a little"  Patient not feeling well due to having a beginning cold.    PT Next Visit Plan DGI, progress core and hip strength, flexibility , add in hip ex and abd    PT Home Exercise Plan piriformis stretch, hamstring, childs pose, LTR , sidelying QL  Consulted and Agree with Plan of Care Patient      Patient will benefit from skilled therapeutic  intervention in order to improve the following deficits and impairments:     Visit Diagnosis: Stiffness of right hip, not elsewhere classified  Radiculopathy, lumbosacral region     Problem List Patient Active Problem List   Diagnosis Date Noted  . Foot pain 10/27/2016  . Abnormal laboratory test 10/09/2016  . Medication management 10/09/2016  . Plantar fasciitis of right foot 02/19/2015  . Depression 08/14/2014  . Family history of heart disease in male family member before age 42 04/26/2014  . Mild cognitive impairment 03/13/2014  . Tremor of both hands 02/12/2014  . Parkinsonian tremor ([place]) 02/12/2014  . Fatigue 09/27/2013  . Hyperlipidemia with target LDL less than 100   . Abdominal aortic atherosclerosis ([place])   . Post-viral cough syndrome 08/20/2011  . Moderate essential hypertension 02/25/2011  . Arthropathy 02/25/2011  . HAV (hallux abducto valgus) 02/25/2011    Shavona Gunderman PTA 10/28/2016, 10:04 AM  [facility] [address] [place], [place], [address] Phone: [phone]   Fax:  [phone]  Name: [name] MRN: [id number] Date of Birth: 1950-12-13

## 2016-10-28 NOTE — Patient Instructions (Signed)

## 2016-11-02 ENCOUNTER — Ambulatory Visit: Payer: 59 | Admitting: Physical Therapy

## 2016-11-02 ENCOUNTER — Encounter: Payer: Self-pay | Admitting: Family Medicine

## 2016-11-02 ENCOUNTER — Encounter: Payer: Self-pay | Admitting: Physical Therapy

## 2016-11-02 ENCOUNTER — Ambulatory Visit (INDEPENDENT_AMBULATORY_CARE_PROVIDER_SITE_OTHER): Payer: 59 | Admitting: Family Medicine

## 2016-11-02 VITALS — BP 120/70 | HR 76 | Temp 97.8°F | Wt 177.0 lb

## 2016-11-02 DIAGNOSIS — J069 Acute upper respiratory infection, unspecified: Secondary | ICD-10-CM

## 2016-11-02 DIAGNOSIS — M5417 Radiculopathy, lumbosacral region: Secondary | ICD-10-CM

## 2016-11-02 DIAGNOSIS — M25651 Stiffness of right hip, not elsewhere classified: Secondary | ICD-10-CM | POA: Diagnosis not present

## 2016-11-02 DIAGNOSIS — N3281 Overactive bladder: Secondary | ICD-10-CM | POA: Diagnosis not present

## 2016-11-02 NOTE — Progress Notes (Signed)
   Subjective:    Patient ID: [name], male    DOB: [birth year], 65 y.o.   MRN: [id number]  HPI He has a five-day history of sore throat, sneezing, dry cough has become slightly more productive, left earache, nasal congestion but no fever, chills. He does not smoke. He does have a trip planned to [place]. Also recently he has had difficulty with OAB symptoms and presently is on may better. Apparently his urologist mention the possibility of OSA also contributing to this. He apparently does snore but has had no difficulty with daytime sleepiness, falling asleep at the wheel or in meetings.   Review of Systems     Objective:   Physical Exam Alert and in no distress. Tympanic membranes and canals are normal. Pharyngeal area is normal. Neck is supple without adenopathy or thyromegaly. Cardiac exam shows a regular sinus rhythm without murmurs or gallops. Lungs are clear to auscultation.        Assessment & Plan:  Acute URI  OAB (overactive bladder)  Supportive care for the URI. Hemoccult in the week is still having difficulty for possible antibiotic. Recommend we defer any further evaluation for OSA until the OAB is under better control. He was comfortable with that.

## 2016-11-02 NOTE — Therapy (Signed)
[name], [place], [address] Phone: [phone]   Fax:  [phone]  Physical Therapy Treatment  Patient Details  Name: [name] MRN: [id number] Date of Birth: Aug 03, 1950 Referring Provider: Dr. Jill Alexanders  Encounter Date: 11/02/2016      PT End of Session - 11/02/16 0931    Visit Number 6   Number of Visits 12   Date for PT Re-Evaluation 11/24/16   PT Start Time 5638   PT Stop Time 0930   PT Time Calculation (min) 33 min   Activity Tolerance Patient tolerated treatment well   Behavior During Therapy [facility] for tasks assessed/performed      Past Medical History:  Diagnosis Date  . Allergy   . Complication of anesthesia    Pt. stated he had a reaction that ended in him requiring urinary cath placement  . Diverticulosis   . Glaucoma   . Gout   . Head injury, closed, with concussion   . Hepatitis C    chronic  . HLD (hyperlipidemia)    statin intolerant (Crestor & Simvastatin) - Taking Livalo 41m / week  . Hypertension   . Plantar fasciitis    right  . P[place]ipheral [place] disease) ([place]    With no claudication; only mild abdominal aortic atherosclerosis noted on ultrasound.  .Marland KitchenUlcer ([facility]     Past Surgical History:  Procedure Laterality Date  . CARDIAC CATHETERIZATION  2005   30% Cx. Dr. GMelvern Banker . cataract surgery Left 11/05/2014  . COLONOSCOPY    . ESOPHAGOGASTRODUODENOSCOPY N/A 05/02/2015   Procedure: ESOPHAGOGASTRODUODENOSCOPY (EGD);  Surgeon: RRonald Lobo MD;  [facility];  Service: Endoscopy;  Laterality: N/A;  . ESOPHAGOGASTRODUODENOSCOPY  05/02/2015   no source of pt chest pain endoscopically evident. small hiatal hernia.  .Marland KitchenEYE SURGERY    . MEMBRANE PEEL Left 03/14/2014   Procedure: MEMBRANE PEEL; ENDOLASER;  Surgeon: GHurman Horn MD;  [place]  Service: Ophthalmology;  Laterality: Left;  . NM MYOVIEW LTD  2011   Neg Ischemia or infarct.  .Marland KitchenNM  MYOVIEW LTD  10/2015   LOW RISK. Small, fixed basal lateral defect - likely diaphragmatic attenuation. EF 69%  . PARS PLANA VITRECTOMY Left 03/14/2014   Procedure: PARS PLANA VITRECTOMY WITH 25 GAUGE;  Surgeon: GHurman Horn MD;  [place]  Service: Ophthalmology;  Laterality: Left;  . TONSILLECTOMY    . TRANSTHORACIC ECHOCARDIOGRAM  2013   Normal EF. No significant Valve Disease  . UPPER GASTROINTESTINAL ENDOSCOPY      There were no vitals filed for this visit.      Subjective Assessment - 11/02/16 0856    Subjective I am feeling better.  No pain.  I have been dealing with a cold.    Currently in Pain? No/denies                         [facility] - 11/02/16 0001      Lumbar Exercises: Supine   Bridge 10 reps   Bridge Limitations 2 sets   Straight Leg Raise 10 reps   Straight Leg Raises Limitations 2 sets alternating      Lumbar Exercises: Sidelying   Clam 10 reps  2 sets   Other Sidelying Lumbar Exercises QL stretch right 60 seconds     Manual Therapy   Manual Therapy Passive ROM   Passive ROM hips flexipon, IR/ER, abduction ,  extension (side)                  PT Short Term Goals - 10/19/16 2395      PT SHORT TERM GOAL #1   Title Pt will complete Balance screen and set goal.    Status Achieved     PT SHORT TERM GOAL #2   Title Pt will be I with HEP (initial) for ROM and core , posture    Status Achieved           PT Long Term Goals - 11/02/16 0857      PT LONG TERM GOAL #1   Title Pt will be I with more advanced HEP for core and trunk flexibility.    Baseline independent with exercises so far   Time 6   Period Weeks   Status On-going     PT LONG TERM GOAL #2   Title Berg Balance score goal TBA    Status Deferred     PT LONG TERM GOAL #3   Title Pt will be able to get in and out of bed at night with min stiffness, pain in back and Rt hip. (<4/10)    Baseline No pain with this .   Time 6    Period Weeks   Status Achieved     PT LONG TERM GOAL #4   Title ,  hip flexion WNL  Hip IR stiff Not Full,  Hip ER tight end,  Not quite full   Baseline hamstrings WNL   Time 6   Period Weeks   Status On-going     PT LONG TERM GOAL #5   Title FOTO score will improve to <40% limited to demo improvement.    Time 6   Period Weeks   Status Unable to assess               Plan - 11/02/16 0926    Clinical Impression Statement LTG#3 met.  No pain today. Right QL sore to touch in sidelyling during stretch.  Hip PROM  WNL all except IR/ER   PT Next Visit Plan DGI, progress core and hip strength, flexibility , add in hip ex and abd    PT Home Exercise Plan piriformis stretch, hamstring, childs pose, LTR , sidelying QL    Consulted and Agree with Plan of Care Patient      Patient will benefit from skilled therapeutic intervention in order to improve the following deficits and impairments:  Decreased range of motion, Increased fascial restricitons, Pain, Decreased balance, Impaired flexibility, Postural dysfunction, Decreased strength, Decreased mobility  Visit Diagnosis: Stiffness of right hip, not elsewhere classified  Radiculopathy, lumbosacral region     Problem List Patient Active Problem List   Diagnosis Date Noted  . Foot pain 10/27/2016  . Abnormal laboratory test 10/09/2016  . Medication management 10/09/2016  . Plantar fasciitis of right foot 02/19/2015  . Depression 08/14/2014  . Family history of heart disease in male family member before age 70 04/26/2014  . Mild cognitive impairment 03/13/2014  . Tremor of both hands 02/12/2014  . 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 Fatigue 09/27/2013  . Hyperlipidemia with target LDL less than 100   . Abdominal aortic atherosclerosis ([place])   . Post-viral cough syndrome 08/20/2011  . Moderate essential hypertension 02/25/2011  . Arthropathy 02/25/2011  . HAV (hallux abducto valgus) 02/25/2011    Cinnamon Morency  PTA 11/02/2016, 9:33 AM  [place]  [place], [place], [address] Phone: [phone]   Fax:  [phone]  Name: [name] MRN: [id number] Date of Birth: 1950/08/13

## 2016-11-04 ENCOUNTER — Ambulatory Visit: Payer: 59 | Admitting: Physical Therapy

## 2016-11-04 ENCOUNTER — Encounter: Payer: Self-pay | Admitting: Physical Therapy

## 2016-11-04 DIAGNOSIS — M25651 Stiffness of right hip, not elsewhere classified: Secondary | ICD-10-CM | POA: Diagnosis not present

## 2016-11-04 DIAGNOSIS — M5417 Radiculopathy, lumbosacral region: Secondary | ICD-10-CM | POA: Diagnosis not present

## 2016-11-04 NOTE — Patient Instructions (Signed)
Issued from drawer : Hip extension stretch with and without quad stretch 3 X each 30 second holds, daily  Hand drawn Clams 10-30 X 3-4  X a week Sidelying

## 2016-11-04 NOTE — Therapy (Signed)
[place], [place], [address] Phone: [phone]   Fax:  [phone]  Physical Therapy Treatment  Patient Details  Name: [name] MRN: [id number] Date of Birth: 31-Jan-1951 Referring Provider: Dr. Jill Alexanders  Encounter Date: 11/04/2016      PT End of Session - 11/04/16 0928    Visit Number 7   Number of Visits 12   Date for PT Re-Evaluation 11/24/16   PT Start Time 0847   PT Stop Time 0930   PT Time Calculation (min) 43 min   Activity Tolerance Patient tolerated treatment well   Behavior During Therapy [facility] for tasks assessed/performed      Past Medical History:  Diagnosis Date  . Allergy   . Complication of anesthesia    Pt. stated he had a reaction that ended in him requiring urinary cath placement  . Diverticulosis   . Glaucoma   . Gout   . Head injury, closed, with concussion   . Hepatitis C    chronic  . HLD (hyperlipidemia)    statin intolerant (Crestor & Simvastatin) - Taking Livalo 1mg  / week  . Hypertension   . Plantar fasciitis    right  . PVD (peripheral vascular disease) ([place])    With no claudication; only mild abdominal aortic atherosclerosis noted on ultrasound.  [name] Ulcer [facility])     Past Surgical History:  Procedure Laterality Date  . CARDIAC CATHETERIZATION  2005   30% Cx. Dr. Melvern Banker  . cataract surgery Left 11/05/2014  . COLONOSCOPY    . ESOPHAGOGASTRODUODENOSCOPY N/A 05/02/2015   Procedure: ESOPHAGOGASTRODUODENOSCOPY (EGD);  Surgeon: Ronald Lobo, MD;  [facility];  Service: Endoscopy;  Laterality: N/A;  . ESOPHAGOGASTRODUODENOSCOPY  05/02/2015   no source of pt chest pain endoscopically evident. small hiatal hernia.  [name] EYE SURGERY    . MEMBRANE PEEL Left 03/14/2014   Procedure: MEMBRANE PEEL; ENDOLASER;  Surgeon: Hurman Horn, MD;  Location: [place];  Service: Ophthalmology;  Laterality: Left;  . NM MYOVIEW LTD  2011   Neg Ischemia or infarct.  [name] NM  MYOVIEW LTD  10/2015   LOW RISK. Small, fixed basal lateral defect - likely diaphragmatic attenuation. EF 69%  . PARS PLANA VITRECTOMY Left 03/14/2014   Procedure: PARS PLANA VITRECTOMY WITH 25 GAUGE;  Surgeon: Hurman Horn, MD;  Location: [place];  Service: Ophthalmology;  Laterality: Left;  . TONSILLECTOMY    . TRANSTHORACIC ECHOCARDIOGRAM  2013   Normal EF. No significant Valve Disease  . UPPER GASTROINTESTINAL ENDOSCOPY      There were no vitals filed for this visit.      Subjective Assessment - 11/04/16 0849    Subjective I have very little pain.  I am still congetred.    Pain Score --  Just a little discomfort, no real pain   Pain Location Eye   Pain Orientation Right   Pain Descriptors / Indicators Aching   Pain Type Chronic pain   Pain Radiating Towards right buttock   Aggravating Factors  turning in bed sonetimes   Pain Relieving Factors exercises moving                         [facility] Adult PT Treatment/Exercise - 11/04/16 0001      Lumbar Exercises: Stretches   Passive Hamstring Stretch 3 reps;30 seconds   Single Knee to Chest Stretch 3 reps;30 seconds   Single Knee to Chest Stretch Limitations  each     Lumbar Exercises: Aerobic   Stationary Bike Nustep L5 X UE/ LE 5 minutes     Lumbar Exercises: Sidelying   Clam 10 reps  each,  HEP   Hip Abduction 10 reps   Hip Abduction Limitations painful low back,  difficult     Moist Heat Therapy   Number Minutes Moist Heat 4 Minutes   Moist Heat Location Lumbar Spine     Manual Therapy   Manual Therapy Passive ROM   Passive ROM Hamstrings, IR/ER                PT Education - 11/04/16 0927    Education provided Yes   Education Details HEP   Person(s) Educated Patient   Methods Explanation;Demonstration;Tactile cues;Verbal cues;Handout   Comprehension Verbalized understanding;Returned demonstration          PT Short Term Goals - 10/19/16 7106      PT SHORT TERM GOAL #1   Title Pt  will complete Balance screen and set goal.    Status Achieved     PT SHORT TERM GOAL #2   Title Pt will be I with HEP (initial) for ROM and core , posture    Status Achieved           PT Long Term Goals - 11/02/16 0857      PT LONG TERM GOAL #1   Title Pt will be I with more advanced HEP for core and trunk flexibility.    Baseline independent with exercises so far   Time 6   Period Weeks   Status On-going     PT LONG TERM GOAL #2   Title Berg Balance score goal TBA    Status Deferred     PT LONG TERM GOAL #3   Title Pt will be able to get in and out of bed at night with min stiffness, pain in back and Rt hip. (<4/10)    Baseline No pain with this .   Time 6   Period Weeks   Status Achieved     PT LONG TERM GOAL #4   Title ,  hip flexion WNL  Hip IR stiff Not Full,  Hip ER tight end,  Not quite full   Baseline hamstrings WNL   Time 6   Period Weeks   Status On-going     PT LONG TERM GOAL #5   Title FOTO score will improve to <40% limited to demo improvement.    Time 6   Period Weeks   Status Unable to assess               Plan - 11/04/16 0929    Clinical Impression Statement Patient is happy with results of PT.  the stretches and exercises are helpful.  Hip Abduction  LT increased low back pain (on side)  IR WNL with PROM now.   PT Next Visit Plan DGI, progress core and hip strength, flexibility , add in hip ex and abd    PT Home Exercise Plan piriformis stretch, hamstring, childs pose, LTR , sidelying QL Quad and hip flexer stretch,  side clams.   Consulted and Agree with Plan of Care Patient      Patient will benefit from skilled therapeutic intervention in order to improve the following deficits and impairments:  Decreased range of motion, Increased fascial restricitons, Pain, Decreased balance, Impaired flexibility, Postural dysfunction, Decreased strength, Decreased mobility  Visit Diagnosis: Stiffness of right hip, not elsewhere  classified  Radiculopathy, lumbosacral region     Problem List Patient Active Problem List   Diagnosis Date Noted  . Foot pain 10/27/2016  . Abnormal laboratory test 10/09/2016  . Medication management 10/09/2016  . Plantar fasciitis of right foot 02/19/2015  . Depression 08/14/2014  . Family history of heart disease in male family member before age 15 04/26/2014  . Mild cognitive impairment 03/13/2014  . Tremor of both hands 02/12/2014  . 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 Fatigue 09/27/2013  . Hyperlipidemia with target LDL less than 100   . Abdominal aortic atherosclerosis ([place])   . Post-viral cough syndrome 08/20/2011  . Moderate essential hypertension 02/25/2011  . Arthropathy 02/25/2011  . HAV (hallux abducto valgus) 02/25/2011    Zayvian Mcmurtry PTA 11/04/2016, 9:37 AM  [facility] [address] [place], [place], [address] Phone: [phone]   Fax:  [phone]  Name: [name] MRN: [id number] Date of Birth: [birth year]

## 2016-11-09 ENCOUNTER — Ambulatory Visit: Payer: 59 | Admitting: Physical Therapy

## 2016-11-09 DIAGNOSIS — R899 Unspecified abnormal finding in specimens from other organs, systems and tissues: Secondary | ICD-10-CM | POA: Diagnosis not present

## 2016-11-09 DIAGNOSIS — R7989 Other specified abnormal findings of blood chemistry: Secondary | ICD-10-CM | POA: Diagnosis not present

## 2016-11-09 DIAGNOSIS — Z79899 Other long term (current) drug therapy: Secondary | ICD-10-CM | POA: Diagnosis not present

## 2016-11-09 DIAGNOSIS — H34812 Central retinal vein occlusion, left eye, with macular edema: Secondary | ICD-10-CM | POA: Diagnosis not present

## 2016-11-09 LAB — BASIC METABOLIC PANEL
BUN / CREAT RATIO: 13 (ref 10–24)
BUN: 14 mg/dL (ref 8–27)
CO2: 28 mmol/L (ref 18–29)
Calcium: 9 mg/dL (ref 8.6–10.2)
Chloride: 101 mmol/L (ref 96–106)
Creatinine, Ser: 1.1 mg/dL (ref 0.76–1.27)
GFR calc Af Amer: 80 mL/min/{1.73_m2} (ref >59)
GFR calc non Af Amer: 70 mL/min/{1.73_m2} (ref >59)
GLUCOSE: 91 mg/dL (ref 65–99)
POTASSIUM: 4.3 mmol/L (ref 3.5–5.2)
Sodium: 139 mmol/L (ref 134–144)

## 2016-11-10 ENCOUNTER — Telehealth: Payer: Self-pay | Admitting: *Deleted

## 2016-11-10 NOTE — Telephone Encounter (Signed)
-----   Message from [name], MD sent at 11/10/2016  1:08 PM EDT ----- Good news. Kidney function looks back to normal. Otherwise labs look better.  Glenetta Hew, MD

## 2016-11-10 NOTE — Telephone Encounter (Signed)
Left message to cal back- information release to [employer]

## 2016-11-11 ENCOUNTER — Encounter: Payer: 59 | Admitting: Physical Therapy

## 2016-11-12 NOTE — Telephone Encounter (Signed)
Follow Up    Returning phone call from nurse

## 2016-11-12 NOTE — Telephone Encounter (Signed)
Spoke to patient. Result given . Verbalized understanding  

## 2016-11-18 ENCOUNTER — Ambulatory Visit: Payer: 59 | Admitting: Physical Therapy

## 2016-11-18 DIAGNOSIS — M5417 Radiculopathy, lumbosacral region: Secondary | ICD-10-CM | POA: Diagnosis not present

## 2016-11-18 DIAGNOSIS — M25651 Stiffness of right hip, not elsewhere classified: Secondary | ICD-10-CM | POA: Diagnosis not present

## 2016-11-18 NOTE — Therapy (Addendum)
[place], [place], [address] Phone: [phone]   Fax:  [phone]  Physical Therapy Treatment and Discharge   Patient Details  Name: [name] MRN: [id number] Date of Birth: 1951/05/23 Referring Provider: Dr. Jill Alexanders  Encounter Date: 11/18/2016      PT End of Session - 11/18/16 0903    Visit Number 8   Number of Visits 12   Date for PT Re-Evaluation 11/24/16   PT Start Time 0854   PT Stop Time 0940   PT Time Calculation (min) 46 min   Activity Tolerance Patient tolerated treatment well   Behavior During Therapy [facility] for tasks assessed/performed      Past Medical History:  Diagnosis Date  . Allergy   . Complication of anesthesia    Pt. stated he had a reaction that ended in him requiring urinary cath placement  . Diverticulosis   . Glaucoma   . Gout   . Head injury, closed, with concussion   . Hepatitis C    chronic  . HLD (hyperlipidemia)    statin intolerant (Crestor & Simvastatin) - Taking Livalo 16m / week  . Hypertension   . Plantar fasciitis    right  . PVD (peripheral vascular disease) ([place]    With no claudication; only mild abdominal aortic atherosclerosis noted on ultrasound.  .Marland KitchenUlcer     Past Surgical History:  Procedure Laterality Date  . CARDIAC CATHETERIZATION  2005   30% Cx. Dr. GMelvern Banker . cataract surgery Left 11/05/2014  . COLONOSCOPY    . ESOPHAGOGASTRODUODENOSCOPY N/A 05/02/2015   Procedure: ESOPHAGOGASTRODUODENOSCOPY (EGD);  Surgeon: RRonald Lobo MD;  [facility];  Service: Endoscopy;  Laterality: N/A;  . ESOPHAGOGASTRODUODENOSCOPY  05/02/2015   no source of pt chest pain endoscopically evident. small hiatal hernia.  .Marland KitchenEYE SURGERY    . MEMBRANE PEEL Left 03/14/2014   Procedure: MEMBRANE PEEL; ENDOLASER;  Surgeon: GHurman Horn MD;  [place]  Service: Ophthalmology;  Laterality: Left;  . NM MYOVIEW LTD  2011   Neg Ischemia or  infarct.  .Marland KitchenNM MYOVIEW LTD  10/2015   LOW RISK. Small, fixed basal lateral defect - likely diaphragmatic attenuation. EF 69%  . PARS PLANA VITRECTOMY Left 03/14/2014   Procedure: PARS PLANA VITRECTOMY WITH 25 GAUGE;  Surgeon: GHurman Horn MD;  [place]  Service: Ophthalmology;  Laterality: Left;  . TONSILLECTOMY    . TRANSTHORACIC ECHOCARDIOGRAM  2013   Normal EF. No significant Valve Disease  . UPPER GASTROINTESTINAL ENDOSCOPY      There were no vitals filed for this visit.      Subjective Assessment - 11/18/16 0859    [place] and the pain came back.    Currently in Pain? Yes   Pain Score 7    Pain Location Back   Pain Orientation Right   Pain Descriptors / Indicators Aching;Sore   Pain Type Chronic pain   Pain Onset More than a month ago   Pain Frequency Intermittent              [facility] Adult PT Treatment/Exercise - 11/18/16 0902      Self-Care   ADL's tennis ball to Rt. QL and Rt. piriformis on wall      Lumbar Exercises: Stretches   Lower Trunk Rotation 10 seconds   Lower Trunk Rotation Limitations x 10    Pelvic  Tilt 5 reps;10 seconds   Standing Side Bend 3 reps;20 seconds   Standing Extension 5 reps;10 seconds   Quadruped Mid Back Stretch 3 reps;30 seconds   Piriformis Stretch 4 reps;20 seconds     Lumbar Exercises: Sidelying   Clam 20 reps   Other Sidelying Lumbar Exercises reverse clam x 20    Other Sidelying Lumbar Exercises QL stretch right 60 seconds     Moist Heat Therapy   Number Minutes Moist Heat 8 Minutes   Moist Heat Location Lumbar Spine     Manual Therapy   Soft tissue mobilization Rt. QL in sidelying, caudal pressure to Rt. iliac crest, sustained pressure                 PT Education - 11/18/16 0903    Education provided Yes          PT Short Term Goals - 11/18/16 1013      PT SHORT TERM GOAL #1   Title Pt will complete Balance screen and set goal.     Status Achieved     PT SHORT TERM GOAL #2   Title Pt will be I with HEP (initial) for ROM and core , posture    Status Achieved           PT Long Term Goals - 11/18/16 1014      PT LONG TERM GOAL #1   Title Pt will be I with more advanced HEP for core and trunk flexibility.    Status On-going     PT LONG TERM GOAL #2   Title Berg Balance score goal TBA    Status Deferred     PT LONG TERM GOAL #3   Title Pt will be able to get in and out of bed at night with min stiffness, pain in back and Rt hip. (<4/10)    Status On-going     PT LONG TERM GOAL #4   Title Increase hamstring AROM within 10 deg of each other   Status On-going     PT LONG TERM GOAL #5   Title FOTO score will improve to <40% limited to demo improvement.    Status On-going               Plan - 11/18/16 1005    Clinical Impression Statement Pt was doing so well, was out of town and got out of his exercise routine.  He had mod to severe pain today which was improved after a shortened session.  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      PT Next Visit Plan DGI, progress core and hip strength, flexibility , add in hip ex and abd , manual to Rt. hip and QL    PT Home Exercise Plan piriformis stretch, hamstring, childs pose, LTR , sidelying QL Quad and hip flexer stretch,  side clams.   Consulted and Agree with Plan of Care Patient      Patient will benefit from skilled therapeutic intervention in order to improve the following deficits and impairments:  Decreased range of motion, Increased fascial restricitons, Pain, Decreased balance, Impaired flexibility, Postural dysfunction, Decreased strength, Decreased mobility  Visit Diagnosis: Radiculopathy, lumbosacral region  Stiffness of right hip, not elsewhere classified     Problem List Patient Active Problem List   Diagnosis Date Noted  . Foot pain 10/27/2016  . Abnormal laboratory test 10/09/2016  .  Medication  management 10/09/2016  . Plantar fasciitis of right foot 02/19/2015  . Depression 08/14/2014  . Family history of heart disease in male family member before age 12 04/26/2014  . Mild cognitive impairment 03/13/2014  . Tremor of both hands 02/12/2014  . 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 Fatigue 09/27/2013  . Hyperlipidemia with target LDL less than 100   . Abdominal aortic atherosclerosis ([place])   . Post-viral cough syndrome 08/20/2011  . Moderate essential hypertension 02/25/2011  . Arthropathy 02/25/2011  . HAV (hallux abducto valgus) 02/25/2011    Carleta Woodrow 11/18/2016, 10:17 AM  [place], [place], [address] Phone: [phone]   Fax:  [phone]  Name: [name] MRN: [id number] Date of Birth: 1950-09-13   Raeford Razor, PT 11/18/16 10:17 AM Phone: [phone] Fax: [phone]  PHYSICAL THERAPY DISCHARGE SUMMARY  Visits from Start of Care: 8  Current functional level related to goals / functional outcomes: See above for most recent info    Remaining deficits: Unknown   Education / Equipment: HEP Plan: Patient agrees to discharge.  Patient goals were partially met. Patient is being discharged due to not returning since the last visit.  ?????    Raeford Razor, PT 01/05/17 10:38 AM Phone: [phone] Fax: [phone]

## 2016-11-20 ENCOUNTER — Telehealth: Payer: Self-pay

## 2016-11-20 NOTE — Telephone Encounter (Signed)
Records faxed to [facility] attorney for workers comp claim per pt signed request to [phone]. Victorino December

## 2016-11-27 MED FILL — LIVALO 1 MG TABLET: 1 | 84 days supply | Qty: 24 | Fill #1

## 2016-12-02 ENCOUNTER — Encounter: Payer: Self-pay | Admitting: Family Medicine

## 2016-12-02 ENCOUNTER — Ambulatory Visit (INDEPENDENT_AMBULATORY_CARE_PROVIDER_SITE_OTHER): Payer: 59 | Admitting: Family Medicine

## 2016-12-02 VITALS — BP 128/78 | HR 67 | Wt 175.0 lb

## 2016-12-02 DIAGNOSIS — G20 Parkinson's disease: Secondary | ICD-10-CM | POA: Diagnosis not present

## 2016-12-02 DIAGNOSIS — N3281 Overactive bladder: Secondary | ICD-10-CM | POA: Diagnosis not present

## 2016-12-02 DIAGNOSIS — Z79899 Other long term (current) drug therapy: Secondary | ICD-10-CM | POA: Diagnosis not present

## 2016-12-02 NOTE — Progress Notes (Signed)
   Subjective:    Patient ID: [name], male    DOB: June 29, 1951, 66 y.o.   MRN: [id number]  HPI he is here for consult concerning multiple issues. He states that over the last 6 weeks he has noted increased difficulty with focus, memory, increased distractibility, inability to finish tasks, sleep disturbance as well as work stress that is been going on for roughly the same timeframe. Prior to this he saw urology and is now being treated for OAB and was given Myrbetriq. He continues on Sinemet. He also notes that since he started Sinemet his emotions have been a little bit more labile.  Review of Systems     Objective:   Physical Exam Alert and in no distress with a slightly flat affect.       Assessment & Plan:  Parkinsonian tremor (HCC)  Medication management  OAB (overactive bladder)  I discussed all the symptoms with him in detail. I decided to have him stop the Myrbetriq to see if this will help with his symptoms. I explained that although symptoms sound like ADD, it would not start at his age and would've been there since school. Also mention the possibility of a combination of the Myrbetriq and Sinemet might possibly have caused this so if he does not improve with coming off the Myrbetriq I will have him see neurology for reevaluation of a possible different medicine.

## 2016-12-02 NOTE — Patient Instructions (Signed)
Stop the Myrbetriq and call me in about 2 weeks and if he still having trouble then will get chin to neurology to discuss possibly switching you do something different from the Sinemet

## 2016-12-22 DIAGNOSIS — H34812 Central retinal vein occlusion, left eye, with macular edema: Secondary | ICD-10-CM | POA: Diagnosis not present

## 2016-12-22 DIAGNOSIS — H34822 Venous engorgement, left eye: Secondary | ICD-10-CM | POA: Diagnosis not present

## 2016-12-22 DIAGNOSIS — H35352 Cystoid macular degeneration, left eye: Secondary | ICD-10-CM | POA: Diagnosis not present

## 2016-12-22 DIAGNOSIS — H4720 Unspecified optic atrophy: Secondary | ICD-10-CM | POA: Diagnosis not present

## 2016-12-22 DIAGNOSIS — H401390 Pigmentary glaucoma, unspecified eye, stage unspecified: Secondary | ICD-10-CM | POA: Diagnosis not present

## 2016-12-24 MED FILL — ALFUZOSIN HCL ER 10 MG TAB: 10 | 90 days supply | Qty: 90 | Fill #0

## 2016-12-30 MED FILL — VERAPAMIL ER 240 MG TABLET: 240 | 90 days supply | Qty: 90 | Fill #2

## 2016-12-30 MED FILL — IRBESARTAN 150 MG TABLET: 150 | 90 days supply | Qty: 90 | Fill #2

## 2017-01-13 ENCOUNTER — Telehealth: Payer: Self-pay | Admitting: *Deleted

## 2017-01-13 NOTE — Telephone Encounter (Signed)
Pt states his ftr recommended Dr. Jacqualyn Posey and he would like to make an appt for a consult. Left message informing pt he should call [phone] scheduler - Dawn would assist with appt.

## 2017-01-19 MED FILL — TIMOLOL 0.5% EYE DROPS: 0.5 | 90 days supply | Qty: 5 | Fill #1

## 2017-01-20 MED FILL — CARBIDOPA-LEVODOPA 25-100 T: 25-100 | 90 days supply | Qty: 405 | Fill #1

## 2017-01-28 ENCOUNTER — Ambulatory Visit (INDEPENDENT_AMBULATORY_CARE_PROVIDER_SITE_OTHER): Payer: 59 | Admitting: Family Medicine

## 2017-01-28 ENCOUNTER — Encounter: Payer: Self-pay | Admitting: Family Medicine

## 2017-01-28 VITALS — BP 122/80 | HR 70 | Wt 171.0 lb

## 2017-01-28 DIAGNOSIS — M461 Sacroiliitis, not elsewhere classified: Secondary | ICD-10-CM

## 2017-01-28 NOTE — Progress Notes (Signed)
   Subjective:    Patient ID: [name], male    DOB: 1950/12/26, 66 y.o.   MRN: [id number]  HPI He complains of difficulty with left upper leg pain that has been getting worse over the last 3 weeks. He states that this does not bother him during the day but when he lies down at night he can notice pain in the thigh area as well as in the low back. He has a previous history of sacroiliitis and has been through physical therapy. He did start back on that several weeks ago and thinks it might of possibly helped a little bit. No numbness, tingling or weakness.   Review of Systems     Objective:   Physical Exam Alert and in no distress. Slight tenderness palpation over the left SI joint. Corky Sox test was negative but stork test was positive. Negative straight leg raising with normal hip motion.       Assessment & Plan:  Sacroiliitis (Shaniko) Recommend starting back on his physical therapy exercises. Did demonstrate several of them to him. Also recommend anti-inflammatory of choice. If his symptoms continue, he is to call me for possible referral.

## 2017-02-04 ENCOUNTER — Ambulatory Visit (INDEPENDENT_AMBULATORY_CARE_PROVIDER_SITE_OTHER): Payer: 59

## 2017-02-04 ENCOUNTER — Encounter: Payer: Self-pay | Admitting: Podiatry

## 2017-02-04 ENCOUNTER — Ambulatory Visit (INDEPENDENT_AMBULATORY_CARE_PROVIDER_SITE_OTHER): Payer: 59 | Admitting: Podiatry

## 2017-02-04 VITALS — BP 131/85 | HR 80 | Resp 18

## 2017-02-04 DIAGNOSIS — M2042 Other hammer toe(s) (acquired), left foot: Secondary | ICD-10-CM | POA: Diagnosis not present

## 2017-02-04 DIAGNOSIS — M2041 Other hammer toe(s) (acquired), right foot: Secondary | ICD-10-CM

## 2017-02-04 DIAGNOSIS — M19071 Primary osteoarthritis, right ankle and foot: Secondary | ICD-10-CM

## 2017-02-04 DIAGNOSIS — M779 Enthesopathy, unspecified: Secondary | ICD-10-CM | POA: Diagnosis not present

## 2017-02-04 DIAGNOSIS — M2011 Hallux valgus (acquired), right foot: Secondary | ICD-10-CM | POA: Diagnosis not present

## 2017-02-04 DIAGNOSIS — M79671 Pain in right foot: Secondary | ICD-10-CM | POA: Diagnosis not present

## 2017-02-04 NOTE — Progress Notes (Signed)
Subjective:    Patient ID: [name], male    DOB: October 29, 1950, 66 y.o.   MRN: [id number]  HPI  66 year old male presents the office today for concerns of bilateral foot pain. He states that the right side as were the left. The right starting point to submetatarsal to illness second MPJ where he points beneath majority of symptoms. He states he states he had orthotics made from sports [facility] as well as he got from the [facility]. He states that the area has become more painful which is making him changes gait is starting to affect his hip. He states he has pain on daily basis. He denies any numbness or tingling to the right foot. The left foot he gets numbness to the fourth and fifth toes as well as pain. This has been ongoing for "some time". He denies any recent injury or trauma to the area. He has no other concerns today.  Review of Systems  Constitutional: Positive for fatigue.  Cardiovascular:       Calf pain with walking   Musculoskeletal: Positive for back pain and gait problem.  Neurological: Positive for light-headedness and numbness.  All other systems reviewed and are negative.      Objective:   Physical Exam General: AAO x3, NAD  Dermatological: Skin is warm, dry and supple bilateral. Nails x 10 are well manicured; remaining integument appears unremarkable at this time. There are no open sores, no preulcerative lesions, no rash or signs of infection present.  Vascular: Dorsalis Pedis artery and Posterior Tibial artery pedal pulses are 2/4 bilateral with immedate capillary fill time.  There is no pain with calf compression, swelling, warmth, erythema.   Neruologic: Grossly intact via light touch bilateral. Vibratory intact via tuning fork bilateral. Protective threshold with Semmes Wienstein monofilament intact to all pedal sites bilateral.   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 There is mild decrease in the  first MPJ range of motion on the right side there is no crepitation. Is no first ray hypermobility present. Majority tenderness appears to the right foot on the second MPJ. There is palpable spurring present the dorsal aspect of the second MPJ there is tenderness submetatarsal 2. There is no overlying edema, erythema, increase in warmth. Left foot there is adductovarus present the fourth and fifth toes and subjective there is numbness these toes there is no pain. Muscular strength 5/5 in all groups tested bilateral.  Gait: Unassisted, Nonantalgic.     Assessment & Plan:  Right second MTPJ osteophytes of capsulitis/HAV, adductovarus left foot resulting neuritis -Treatment options discussed including all alternatives, risks, and complications -Etiology of symptoms were discussed -X-rays were obtained and reviewed with the patient. Arthritic changes present on the first MTPJ and there is HAV present. Arthritic changes present in the second MPJ as well. No evidence of acute fracture. -Width discussion regards to both conservative and surgical treatment options. Conservatively we discussed the change in orthotics as well as shoe gear modifications. He was still off on any further orthotics but I would like for him to come in to see Liliane Channel see if we can modify what he has cementless better. I did dispense a graphite insert is try to wear in his right shoe to see if this will help. -Discussed surgical intervention the patient. Discussed them Austin bunionectomy with second metatarsal osteotomy with a fracture of the second metatarsal head and possible graft application to help to the joint. If  he desires surgical intervention will likely do this in September. Will continue conservative treatment for now.  -Discussed the neuritis the left foot fourth and fifth toes. His been ongoing for some time so likely will continue. Discussed the different offloading pads to help straighten the toes.  Celesta Gentile, DPM

## 2017-02-11 ENCOUNTER — Encounter: Payer: Self-pay | Admitting: Nurse Practitioner

## 2017-02-11 ENCOUNTER — Ambulatory Visit: Payer: 59 | Admitting: Orthotics

## 2017-02-11 ENCOUNTER — Ambulatory Visit (INDEPENDENT_AMBULATORY_CARE_PROVIDER_SITE_OTHER): Payer: 59 | Admitting: Nurse Practitioner

## 2017-02-11 VITALS — BP 128/82 | HR 84 | Wt 173.2 lb

## 2017-02-11 DIAGNOSIS — G20 Parkinson's disease: Secondary | ICD-10-CM

## 2017-02-11 DIAGNOSIS — G3184 Mild cognitive impairment, so stated: Secondary | ICD-10-CM

## 2017-02-11 MED ORDER — CARBIDOPA-LEVODOPA 25-100 MG PO TABS: ORAL_TABLET | ORAL | 1 refills | Status: DC

## 2017-02-11 NOTE — Patient Instructions (Addendum)
Continue carbidopa/levodopa at current dose  Get into a routine exercise program Do mentally challenging activities like crossword puzzles and playing bridge to do go solitaire etc. Given information on Parkinson's disease Follow-up in 6 months   Parkinson Disease Parkinson disease is a long-term (chronic) condition that gets worse over time (is progressive). Parkinson disease limits your ability to control your movements and move your body normally. This condition is a type of movement disorder. Each person with Parkinson disease is affected differently. The condition can range from mild to severe. Parkinson disease tends to progress slowly over several years. What are the causes? Parkinson disease results from a loss of brain cells (neurons) in a specific part of the brain (substantia nigra). Some of the neurons in the substantia nigra make an important brain chemical (dopamine). Dopamine is needed to control movement. As the condition gets worse, neurons make less dopamine. This makes it hard to move or control your movements. The exact cause of why neurons are lost or produce less dopamine is not known. Genetic and environmental factors may contribute to the cause of Parkinson disease. What increases the risk? This condition is more likely to develop in:  Men.  People who are 44 years of age or older.  People who have a family history of Parkinson disease.  What are the signs or symptoms? Symptoms of this condition can vary from person to person. The main (primary) symptoms are related to movement (motor symptoms). These include:  Uncontrolled shaking movements (tremor). Tremors usually start in a hand or foot when you are resting (resting tremor). The tremor may stop when you move around.  Slowing of movement. You may lose facial expression and have trouble making the small movements that are needed to button clothing or brush your teeth. You may walk with short, shuffling  steps.  Stiff movement (rigidity). This mostly affects your arms, legs, neck, and upper body. You may walk without swinging your arms. Rigidity can be painful.  Loss of balance and stability when standing. You may sway, fall backward, and have trouble making turns.  Secondary motor symptoms of this condition include:  Shrinking handwriting.  Stooped posture.  Slowed speech.  Trouble swallowing.  Drooling.  Sexual dysfunction.  Muscle cramps.  Loss of smell.  Additional symptoms that are not related to movement include:  Constipation.  Mood swings.  Depression or anxiety.  Sleep disturbances.  Confusion.  Loss of mental abilities (dementia).  Low blood pressure.  Trouble concentrating.  How is this diagnosed? Parkinson disease can be hard to diagnose in its early stages. A diagnosis may be made based on symptoms, a medical history, and physical exam. During your exam, your health care provider will look for:  Lack of facial expression.  Resting tremor.  Stiffness in your neck, arms, and legs.  Abnormal walk.  Trouble with balance.  You may have brain imaging tests done to check for a loss of dopamine-producing areas of the brain. Your healthcare provider may also grade the severity of your condition as mild, moderate, or advanced. Parkinson disease progression is different for everyone. You may not progress to the advanced stage. Mild Parkinson disease involves:  Movement problems that do not affect daily activities.  Movement problems on one side of the body.  Movement problems that are controlled with medicines.  Good response to exercise.  Moderate Parkinson disease involves:  Movement problems on both sides of the body.  Slowing of movement.  Coordination and balance problems.  Less of  a response to medicine.  More side effects from medicines.  Advanced Parkinson disease involves:  Extreme difficulty walking.  Inability to live  alone safely.  Signs of dementia.  Difficulty controlling symptoms with medicine.  How is this treated? There is no cure for Parkinson disease. Treatment focuses on relieving your symptoms. Treatment may include:  Medicines.  Speech, occupational, and physical therapy.  Surgery.  Everyone responds to medicines differently. Your response may change over time. Work with your health care provider to find the best medicines for you. These may include:  Dopamine replacement drugs. These are the most effective medicines. A long-term side effect of these medicines is uncontrolled movements (dyskinesias).  Dopamine agonists. These drugs act like dopamine to stimulate dopamine receptors in the brain. Side effects include nausea and sleepiness, but they cause less dyskinesia.  Other medicines to reduce tremor, prevent dopamine breakdown, reduce dyskinesia, and reduce dementia that is related to Parkinson disease.  Another treatment is deep brain stimulation surgery to reduce tremors and dyskinesia. This procedure involves placing electrodes in the brain. The electrodes are attached to an electric pulse generator that acts like a pacemaker for your brain. This may be an option if you have had the condition for at least four years and are not responding well to medicines. Follow these instructions at home:  Take over-the-counter and prescription medicines only as told by your health care provider.  Install grab bars and railings in your home to prevent falls.  Follow instructions from your health care provider about eating or drinking restrictions.  Return to your normal activities as told by your health care provider. Ask your health care provider what activities are safe for you.  Get regular exercise as told by your health care provider or a physical therapist.  Keep all follow-up visits as told by your health care provider. This is important. These include any visits with a speech  therapist or occupational therapist.  Consider joining a support group for people with Parkinson disease. Contact a health care provider if:  Medicines do not help your symptoms.  You are unsteady or have fallen at home.  You need more support to function well at home.  You have trouble swallowing.  You have severe constipation.  You are struggling with side effects from your medicines.  You see or hear things that are not real (hallucinate).  You feel confused, anxious, or depressed. Get help right away if:  You are injured after a fall.  You cannot swallow without choking.  You have chest pain or trouble breathing.  You do not feel safe at home. This information is not intended to replace advice given to you by your health care provider. Make sure you discuss any questions you have with your health care provider. Document Released: 07/10/2000 Document Revised: 12/16/2015 Document Reviewed: 05/03/2015 Elsevier Interactive Patient Education  [employer].

## 2017-02-11 NOTE — Progress Notes (Signed)
[facility] PATIENT: [name] DOB: Mar 16, 1951   REASON FOR VISIT: Follow-up for tremor mild cognitive impairment, depression HISTORY FROM: Patient  HISTORY OF PRESENT ILLNESS:HISTORY:63 year African American male who since last year and a half has noticed increasing tremors mainly in the left arm and leg and to a lesser degree in the right arm as well. He states the tremors were quite mild but became more pronounced after a minor accident while staying [place] the [place] in [place]. He fell down 12 flights of steps and hit a concrete slab and had a concussion. He and some minor bruises and knee injury which took several months to reck of her period CT scan of the head was done 2 days later which was unremarkable. Since then he has had some walking difficulty but this may be related to his knee pain. He says that his feet do at times gets stuck in his started walking with a stooped posture. He however does not describe typical festination. He has resting and intermittent action tremor claiming the left arm and leg the tremor does improve with activities. The tremor does not seem to interfere with most of his routine. He denies significant bradykinesia, and drooling of saliva or micrographia. He has been evaluated by neurologists Dr. Reginia Forts at [facility] neurology but he is unable to tell me for the diagnosis was. He was not told that this may be Parkinson's and was not tried on dopaminergic medications. He did have an MRI scan and some lab work but I do not have those results for my review available today. Patient is here today for a second opinion as he feels his tremor is not getting better. He does admit to some light masonry hallucinations as well as restless sleep thrashing of his legs. He denies significant memory or cognitive difficulties. He has not noticed any particular effect of alcohol on his tremor. Is no family history of tremors. Patient does have  chronic hepatitis C and is planning to start treatment with dapsone. He has had no seizures, significant head injury with major loss of consciousness, stroke, TIAs or significant neurological problems.  Update 08/14/2014 : He returns for follow-up after last visit 3 months ago. He feels his tremors are about the same. He had tried reducing the dose of Sinemet but noticed that his tremors got worse and hence he went back up to the original dose which is 25/100 one tablet 3 times daily. He still feels occasionally confused and at times secondary to some cells but he admits that he worries a lot. He also admits to feeling depressed, getting tired easily and not having initiated. He has not been on any medications for depression. Patient denies significant drooling of saliva, bradykinesia, gait or balance problems. He feels his tremor is mild and does not interfere with his activities of daily living. I discussed alternative treatment options including addition of dopamine agonist, anticholinergic or even consideration for deep brain stimulation since his tremor is predominantly unilateral however the patient does not want to consider more aggressive treatment options at the present time. UPDATE 12/26/14 [name], 66 year old black male returns for follow-up. He was last seen by Dr. [name] 08/14/2014. He feels his tremors are better since increasing his carbidopa levodopa to 1-1/2 tablets 3 times daily. He is tolerating it well without dizziness, sleepiness, nightmares or hallucinations A MRI Brain with and without Contrast completed on 07/29/2011 was normal. The Mini-Mental status exam today is unchanged 29 out  of 30. He did not follow-up with his primary care for  complaints of depression. He denies any significant drooling bradykinesia falls or balance problems. His tremor does not interfere with his activities of daily living. He returns for reevaluation Update 07/03/2015 : He returns for follow-up after last  visit 6 months ago. He states his tremors continue to do well and he seems to have responded quite well to increased dose of Sinemet 25/100 1-1/2 tablet 3 times daily. Is tolerating it well without any dizziness, sleepiness, nightmares, hallucinations, nausea or diarrhea. He continues to have mild memory difficulties but his has not been on any treatment for depression and in fact has not seen his primary care physician for the same for quite some time. Update 08/11/2016 PS : He returns for follow-up after last visit year ago. He states his tremors as well as memory loss or more or less unchanged. Continues to have intermittent tremors in the left hand mainly. The tremors fluctuate he has good days and bad days. He is currently taking Sinemet 25/100 one and half tablet 3 times daily. He complains of mild fatigue and daytime sleepiness. He denies hallucinations, delusions, dizziness or upset stomach. He does not want to try higher dose because of fear of side effects. Continues to have short-term memory difficulties. He has not been quite compulsive about taking notes and using memory compensation strategies but plans to do so. He has no new complaints today. UPDATE 07/19/2018CM [name], 66 year old male returns for follow-up with tremors and mild cognitive impairment which he says is stable. He continues to have a resting tremor in the left hand which is intermittent. He continues to have good and bad days. He remains on Sinemet 25 100 (1 and half tablets) 3 times daily. He does little exercise he is not doing any memory compensation strategies at this time and was encouraged to do so. He denies any falls. He denies any hallucinations or increased confusion. He denies any freezing spells. He does complain of left hip pain He returns for reevaluation  REVIEW OF SYSTEMS: Full 14 system review of systems performed and notable only for those listed, all others are neg:  Constitutional: neg  Cardiovascular:  neg Ear/Nose/Throat: neg  Skin: neg Eyes: neg Respiratory: neg Gastroitestinal: neg  Hematology/Lymphatic: neg  Endocrine: neg Musculoskeletal: Joint pain, hip Allergy/Immunology: neg Neurological: Mild cognitive impairment Psychiatric: Anxiety Sleep : neg   ALLERGIES: No Known Allergies  HOME MEDICATIONS: Outpatient Medications Prior to Visit  Medication Sig Dispense Refill  . alfuzosin (UROXATRAL) 10 MG 24 hr tablet Take 10 mg by mouth at bedtime.     [name] aspirin EC 81 MG tablet Take 81 mg by mouth daily.    . carbidopa-levodopa (SINEMET IR) 25-100 MG tablet TAKE 1 AND 1/2 TABLET BY MOUTH 3 TIMES DAILY. 405 tablet 1  . irbesartan (AVAPRO) 150 MG tablet TAKE 1 TABLET BY MOUTH DAILY. 90 tablet 2  . Multiple Vitamin (MULTIVITAMIN WITH MINERALS) TABS tablet Take 1 tablet by mouth daily.    . nitroGLYCERIN (NITROSTAT) 0.4 MG SL tablet Place 1 tablet (0.4 mg total) under the tongue every 5 (five) minutes as needed for chest pain. 25 tablet 6  . Omega-3 Fatty Acids (FISH OIL) 1000 MG CAPS Take by mouth.    . pantoprazole (PROTONIX) 40 MG tablet Take 40 mg by mouth as needed.   0  . Pitavastatin Calcium (LIVALO) 1 MG TABS Take 1 tablet by mouth two times a week. <PLEASE MAKE APPOINTMENT  FOR REFILLS> 24 tablet 1  . timolol (TIMOPTIC) 0.5 % ophthalmic solution Place 1 drop into both eyes daily.    . verapamil (CALAN-SR) 240 MG CR tablet Take 1 tablet (240 mg total) by mouth daily. 30 tablet 6   No facility-administered medications prior to visit.     PAST MEDICAL HISTORY: Past Medical History:  Diagnosis Date  . Allergy   . Complication of anesthesia    Pt. stated he had a reaction that ended in him requiring urinary cath placement  . Diverticulosis   . Glaucoma   . Gout   . Head injury, closed, with concussion   . Hepatitis C    chronic  . HLD (hyperlipidemia)    statin intolerant (Crestor & Simvastatin) - Taking Livalo 1mg  / week  . Hypertension   . Plantar fasciitis     right  . PVD (peripheral vascular disease) ([place])    With no claudication; only mild abdominal aortic atherosclerosis noted on ultrasound.  [name] Ulcer     PAST SURGICAL HISTORY: Past Surgical History:  Procedure Laterality Date  . CARDIAC CATHETERIZATION  2005   30% Cx. Dr. Melvern Banker  . cataract surgery Left 11/05/2014  . COLONOSCOPY    . ESOPHAGOGASTRODUODENOSCOPY N/A 05/02/2015   Procedure: ESOPHAGOGASTRODUODENOSCOPY (EGD);  Surgeon: Ronald Lobo, MD;  Location: [facility];  Service: Endoscopy;  Laterality: N/A;  . ESOPHAGOGASTRODUODENOSCOPY  05/02/2015   no source of pt chest pain endoscopically evident. small hiatal hernia.  [name] EYE SURGERY    . MEMBRANE PEEL Left 03/14/2014   Procedure: MEMBRANE PEEL; ENDOLASER;  Surgeon: Hurman Horn, MD;  Location: [place];  Service: Ophthalmology;  Laterality: Left;  . NM MYOVIEW LTD  2011   Neg Ischemia or infarct.  [name] NM MYOVIEW LTD  10/2015   LOW RISK. Small, fixed basal lateral defect - likely diaphragmatic attenuation. EF 69%  . PARS PLANA VITRECTOMY Left 03/14/2014   Procedure: PARS PLANA VITRECTOMY WITH 25 GAUGE;  Surgeon: Hurman Horn, MD;  Location: [place];  Service: Ophthalmology;  Laterality: Left;  . TONSILLECTOMY    . TRANSTHORACIC ECHOCARDIOGRAM  2013   Normal EF. No significant Valve Disease  . UPPER GASTROINTESTINAL ENDOSCOPY      FAMILY HISTORY: Family History  Problem Relation Age of Onset  . Heart disease Mother   . Cancer Paternal Grandfather   . Cancer Sister     SOCIAL HISTORY: Social History   Social History  . Marital status: Married    Spouse name: barbra gen  . Number of children: 3  . Years of education: AS   Occupational History  . self employed [employer]   Social History Main Topics  . Smoking status: Former Smoker    Types: Cigars    Quit date: 07/27/1988  . Smokeless tobacco: Never Used  . Alcohol use 1.8 oz/week    1 Glasses of wine, 1 Cans of beer, 1 Shots of liquor per week     Comment:  occasional  . Drug use: No  . Sexual activity: Yes   Other Topics Concern  . 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     Wife is retired Therapist, sports from [employer].   He walks roughly 2-3 miles in bed time at least 2-3 times a week. He quit smoking 30 years ago.     PHYSICAL EXAM  Vitals:   02/11/17 0837  BP: 128/82  Pulse:  84  Weight: 173 lb 3.2 oz (78.6 kg)   Body mass index is 24.85 kg/m.  Generalized: Well developed, in no acute distress Negative myersons  sign Head: normocephalic and atraumatic,. Oropharynx benign  Neck: Supple, no carotid bruits  Cardiac: Regular rate rhythm, no murmur  Musculoskeletal: No deformity   Neurological examination   Mentation: Alert oriented to time, place, history taking. Attention span and concentration appropriate.  Diminished recall  Follows all commands speech and language fluent.   Cranial nerve II-XII: Pupils were equal round reactive to light extraocular movements were full, visual field were full on confrontational test. Facial sensation and strength were normal. hearing was intact to finger rubbing bilaterally. Uvula tongue midline. head turning and shoulder shrug were normal and symmetric.Tongue protrusion into cheek strength was normal. Motor: normal bulk and tone, full strength in the BUE, BLE, fine finger movements normal, no bradykinesia, intermittent left hand resting tremor, cogwheeling at the left wrist and elbow  Sensory: normal and symmetric to light touch, on the face arms and legs  Coordination: finger-nose-finger, heel-to-shin bilaterally, no dysmetria Reflexes: 1+ upper lower and symmetric plantar responses were flexor bilaterally. Gait and Station: Rising up from seated position without assistance, normal stance,  moderate stride, decreased  arm swing, smooth turning, able to perform tiptoe, and heel walking without difficulty. Tandem gait is  steady  DIAGNOSTIC DATA (LABS, IMAGING, TESTING) - I reviewed patient records, labs, notes, testing and imaging myself where available.  Lab Results  Component Value Date   WBC 4.1 09/28/2016   HGB 14.6 09/28/2016   HCT 41.6 09/28/2016   MCV 88 09/28/2016   PLT 263 09/28/2016      Component Value Date/Time   NA 139 11/09/2016 0936   K 4.3 11/09/2016 0936   CL 101 11/09/2016 0936   CO2 28 11/09/2016 0936   GLUCOSE 91 11/09/2016 0936   GLUCOSE 89 06/29/2014 0920   BUN 14 11/09/2016 0936   CREATININE 1.10 11/09/2016 0936   CREATININE 1.06 06/29/2014 0920   CALCIUM 9.0 11/09/2016 0936   PROT 7.4 09/28/2016 0920   ALBUMIN 4.5 09/28/2016 0920   AST 15 09/28/2016 0920   ALT 15 09/28/2016 0920   ALKPHOS 74 09/28/2016 0920   BILITOT 0.6 09/28/2016 0920   GFRNONAA 70 11/09/2016 0936   GFRAA 80 11/09/2016 0936   Lab Results  Component Value Date   CHOL 153 09/28/2016   HDL 52 09/28/2016   LDLCALC 94 09/28/2016   TRIG 36 09/28/2016   CHOLHDL 2.9 09/28/2016     Lab Results  Component Value Date   TSH 1.950 09/28/2016    ASSESSMENT AND PLAN 66 y.o. 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 Good response to Sinemet. He also complains of mild cognitive impairment which appears stable.  Continue carbidopa/levodopa at current dose will refill Get into a routine exercise program Do mentally challenging activities like crossword puzzles and playing bridge to do go solitaire etc. Given information on Parkinson's disease Follow-up in 6 months  I spent 25 minutes in total face to face time with the patient more than 50% of which was spent counseling and coordination of care, reviewing test results reviewing medications and discussing and reviewing the diagnosis of Parkinson's disease and mild cognitive impairment and strategies for both, Dennie Bible, [facility], [facility],  APRN  [facility] [address], [place], [place]  [address] [phone] 

## 2017-02-12 ENCOUNTER — Telehealth: Payer: Self-pay | Admitting: Family Medicine

## 2017-02-12 ENCOUNTER — Other Ambulatory Visit: Payer: Self-pay

## 2017-02-12 DIAGNOSIS — M79651 Pain in right thigh: Secondary | ICD-10-CM

## 2017-02-12 DIAGNOSIS — M25551 Pain in right hip: Secondary | ICD-10-CM

## 2017-02-12 NOTE — Telephone Encounter (Signed)
Refer him to orthopedics. See if he has a preference

## 2017-02-12 NOTE — Telephone Encounter (Signed)
Pt requesting a referral to a chiropractor or some specialist since he is still having thigh and hip pain

## 2017-02-12 NOTE — Telephone Encounter (Signed)
Pt prefers [facility] I have put order in epic

## 2017-02-16 ENCOUNTER — Ambulatory Visit (INDEPENDENT_AMBULATORY_CARE_PROVIDER_SITE_OTHER): Payer: 59

## 2017-02-16 ENCOUNTER — Ambulatory Visit (INDEPENDENT_AMBULATORY_CARE_PROVIDER_SITE_OTHER): Payer: 59 | Admitting: Orthopaedic Surgery

## 2017-02-16 DIAGNOSIS — M25551 Pain in right hip: Secondary | ICD-10-CM

## 2017-02-16 DIAGNOSIS — M5442 Lumbago with sciatica, left side: Secondary | ICD-10-CM

## 2017-02-16 DIAGNOSIS — M25552 Pain in left hip: Secondary | ICD-10-CM

## 2017-02-16 DIAGNOSIS — H34812 Central retinal vein occlusion, left eye, with macular edema: Secondary | ICD-10-CM | POA: Diagnosis not present

## 2017-02-16 DIAGNOSIS — G8929 Other chronic pain: Secondary | ICD-10-CM

## 2017-02-16 DIAGNOSIS — M5441 Lumbago with sciatica, right side: Secondary | ICD-10-CM | POA: Diagnosis not present

## 2017-02-16 MED ORDER — DICLOFENAC SODIUM 75 MG PO TBEC: 75.0000 mg | DELAYED_RELEASE_TABLET | Freq: Two times a day (BID) | ORAL | 2 refills | Status: DC

## 2017-02-16 MED FILL — DICLOFENAC SOD 75 MG TAB EC: 75 | 15 days supply | Qty: 30 | Fill #0

## 2017-02-16 NOTE — Progress Notes (Signed)
Office Visit Note   Patient: [name]           Date of Birth: January 02, 1951           MRN: [id number] Visit Date: 02/16/2017              Requested by: Denita Lung, MD [place], [place] [address] PCP: Denita Lung, MD   Assessment & Plan: Visit Diagnoses:  1. Pain of both hip joints   2. Chronic bilateral low back pain with bilateral sciatica     Plan: I reviewed his previous lumbar radiographs that do show severe degenerative disc disease at L5-S1 with lumbar spondylosis. Recommend MRI to rule out structural abnormalities. Diclofenac was prescribed. Follow-up after the MRI.  Follow-Up Instructions: Return in about 2 weeks (around 03/02/2017).   Orders:  Orders Placed This Encounter  Procedures  . XR Pelvis 1-2 Views  . MR LUMBAR SPINE WO CONTRAST   Meds ordered this encounter  Medications  . diclofenac (VOLTAREN) 75 MG EC tablet    Sig: Take 1 tablet (75 mg total) by mouth 2 (two) times daily.    Dispense:  30 tablet    Refill:  2      Procedures: No procedures performed   Clinical Data: No additional findings.   Subjective: Chief Complaint  Patient presents with  . Right Hip - Pain    Patient is a 66 year old gentleman with right hip and left thigh pain with low back pain that radiates down into the thigh with tingling at times. He has seen a chiropractor and has had conservative treatment for the last several months March. He denies any groin pain. Denies any numbness or tingling. He does endorse back pain is worse with activity and with ambulation.    Review of Systems  Constitutional: Negative.   All other systems reviewed and are negative.    Objective: Vital Signs: There were no vitals taken for this visit.  Physical Exam  Constitutional: He is oriented to person, place, and time. He appears well-developed and well-nourished.  HENT:  Head: Normocephalic and atraumatic.  Eyes: Pupils are equal, round, and reactive  to light.  Neck: Neck supple.  Pulmonary/Chest: Effort normal.  Abdominal: Soft.  Musculoskeletal: Normal range of motion.  Neurological: He is alert and oriented to person, place, and time.  Skin: Skin is warm.  Psychiatric: He has a normal mood and affect. His behavior is normal. Judgment and thought content normal.  Nursing note and vitals reviewed.   Ortho Exam Bilateral hip exam is essentially benign. Lateral hip is nontender. No sciatic tension signs. Specialty Comments:  No specialty comments available.  Imaging: Xr Pelvis 1-2 Views  Result Date: 02/16/2017 No significant degenerative joint disease    PMFS History: Patient Active Problem List   Diagnosis Date Noted  . Abnormal laboratory test 10/09/2016  . Medication management 10/09/2016  . Plantar fasciitis of right foot 02/19/2015  . Depression 08/14/2014  . Family history of heart disease in male family member before age 57 04/26/2014  . Mild cognitive impairment 03/13/2014  . 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 Hyperlipidemia with target LDL less than 100   . Abdominal aortic atherosclerosis ([place])   . Moderate essential hypertension 02/25/2011  . Arthropathy 02/25/2011  . HAV (hallux abducto valgus) 02/25/2011   Past Medical History:  Diagnosis Date  . Allergy   . Complication of anesthesia    Pt. stated he had a reaction that ended in  him requiring urinary cath placement  . Diverticulosis   . Glaucoma   . Gout   . Head injury, closed, with concussion   . Hepatitis C    chronic  . HLD (hyperlipidemia)    statin intolerant (Crestor & Simvastatin) - Taking Livalo 1mg  / week  . Hypertension   . Plantar fasciitis    right  . PVD (peripheral vascular disease) ([place])    With no claudication; only mild abdominal aortic atherosclerosis noted on ultrasound.  [name] Ulcer     Family History  Problem Relation Age of Onset  . Heart disease Mother   . Cancer Paternal Grandfather   . Cancer Sister       Past Surgical History:  Procedure Laterality Date  . CARDIAC CATHETERIZATION  2005   30% Cx. Dr. Melvern Banker  . cataract surgery Left 11/05/2014  . COLONOSCOPY    . ESOPHAGOGASTRODUODENOSCOPY N/A 05/02/2015   Procedure: ESOPHAGOGASTRODUODENOSCOPY (EGD);  Surgeon: Ronald Lobo, MD;  Location: [facility];  Service: Endoscopy;  Laterality: N/A;  . ESOPHAGOGASTRODUODENOSCOPY  05/02/2015   no source of pt chest pain endoscopically evident. small hiatal hernia.  [name] EYE SURGERY    . MEMBRANE PEEL Left 03/14/2014   Procedure: MEMBRANE PEEL; ENDOLASER;  Surgeon: Hurman Horn, MD;  Location: [facility];  Service: Ophthalmology;  Laterality: Left;  . NM MYOVIEW LTD  2011   Neg Ischemia or infarct.  [name] NM MYOVIEW LTD  10/2015   LOW RISK. Small, fixed basal lateral defect - likely diaphragmatic attenuation. EF 69%  . PARS PLANA VITRECTOMY Left 03/14/2014   Procedure: PARS PLANA VITRECTOMY WITH 25 GAUGE;  Surgeon: Hurman Horn, MD;  Location: [facility];  Service: Ophthalmology;  Laterality: Left;  . TONSILLECTOMY    . TRANSTHORACIC ECHOCARDIOGRAM  2013   Normal EF. No significant Valve Disease  . UPPER GASTROINTESTINAL ENDOSCOPY     Social History   Occupational History  . self employed [employer]   Social History Main Topics  . Smoking status: Former Smoker    Types: Cigars    Quit date: 07/27/1988  . Smokeless tobacco: Never Used  . Alcohol use 1.8 oz/week    1 Glasses of wine, 1 Cans of beer, 1 Shots of liquor per week     Comment: occasional  . Drug use: No  . Sexual activity: Yes

## 2017-02-18 NOTE — Progress Notes (Signed)
I agree with the above plan 

## 2017-02-22 ENCOUNTER — Ambulatory Visit
Admission: RE | Admit: 2017-02-22 | Discharge: 2017-02-22 | Disposition: A | Discharge status: Home / Self Care | Payer: 59 | Source: Ambulatory Visit | Attending: Orthopaedic Surgery | Admitting: Orthopaedic Surgery

## 2017-02-22 DIAGNOSIS — M5126 Other intervertebral disc displacement, lumbar region: Secondary | ICD-10-CM | POA: Diagnosis not present

## 2017-02-22 DIAGNOSIS — G8929 Other chronic pain: Secondary | ICD-10-CM

## 2017-02-22 DIAGNOSIS — M48061 Spinal stenosis, lumbar region without neurogenic claudication: Secondary | ICD-10-CM | POA: Diagnosis not present

## 2017-02-22 DIAGNOSIS — M5442 Lumbago with sciatica, left side: Principal | ICD-10-CM

## 2017-02-22 DIAGNOSIS — M5441 Lumbago with sciatica, right side: Principal | ICD-10-CM

## 2017-02-23 ENCOUNTER — Telehealth (INDEPENDENT_AMBULATORY_CARE_PROVIDER_SITE_OTHER): Payer: Self-pay | Admitting: *Deleted

## 2017-02-23 NOTE — Telephone Encounter (Signed)
Pt states medication given to him at last ov has been upsetting his stomach and causing lots of gas, says this is the only thing different he has been taking, says has helped but wants to know if you want him to take something else, or what needs to be done. Please advise.  CB# Edie

## 2017-02-23 NOTE — Telephone Encounter (Signed)
He can try mobic 15 mg daily prn #30

## 2017-02-23 NOTE — Telephone Encounter (Signed)
Please advise 

## 2017-02-24 MED ORDER — MELOXICAM 15 MG PO TABS: 15.0000 mg | ORAL_TABLET | Freq: Every day | ORAL | 0 refills | Status: DC | PRN

## 2017-02-24 MED FILL — MELOXICAM 15 MG TABLET: 15 | 30 days supply | Qty: 30 | Fill #0

## 2017-02-24 NOTE — Telephone Encounter (Signed)
Rx sent into pharm. Called patient. Patient aware.

## 2017-02-26 ENCOUNTER — Other Ambulatory Visit: Payer: Self-pay | Admitting: Cardiology

## 2017-02-26 MED FILL — LIVALO 1 MG TABLET: 1 | 84 days supply | Qty: 24 | Fill #0

## 2017-03-02 ENCOUNTER — Ambulatory Visit (INDEPENDENT_AMBULATORY_CARE_PROVIDER_SITE_OTHER): Payer: 59 | Admitting: Orthopaedic Surgery

## 2017-03-02 DIAGNOSIS — M5441 Lumbago with sciatica, right side: Secondary | ICD-10-CM

## 2017-03-02 DIAGNOSIS — G8929 Other chronic pain: Secondary | ICD-10-CM

## 2017-03-02 DIAGNOSIS — M5442 Lumbago with sciatica, left side: Secondary | ICD-10-CM | POA: Diagnosis not present

## 2017-03-02 NOTE — Progress Notes (Signed)
Office Visit Note   Patient: [name]           Date of Birth: 1950/10/26           MRN: [id number] Visit Date: 03/02/2017              Requested by: Denita Lung, MD [address] [place], [place] [address] PCP: Denita Lung, MD   Assessment & Plan: Visit Diagnoses:  1. Chronic bilateral low back pain with bilateral sciatica     Plan: MRI shows significant stenosis in the lower lumbar segments. Referral to Dr. Ernestina Patches for epidural steroid injection.  Follow-Up Instructions: Return if symptoms worsen or fail to improve.   Orders:  Orders Placed This Encounter  Procedures  . Ambulatory referral to Physical Medicine Rehab   No orders of the defined types were placed in this encounter.     Procedures: No procedures performed   Clinical Data: No additional findings.   Subjective: Chief Complaint  Patient presents with  . Lower Back - Pain, Follow-up    Patient follows up today to review his MRI. He denies any new numbness and tingling.    Review of Systems   Objective: Vital Signs: There were no vitals taken for this visit.  Physical Exam  Ortho Exam Exam is stable. Specialty Comments:  No specialty comments available.  Imaging: No results found.   PMFS History: Patient Active Problem List   Diagnosis Date Noted  . Abnormal laboratory test 10/09/2016  . Medication management 10/09/2016  . Plantar fasciitis of right foot 02/19/2015  . Depression 08/14/2014  . Family history of heart disease in male family member before age 73 04/26/2014  . Mild cognitive impairment 03/13/2014  . Parkinsonian tremor ([facility]) 02/12/2014  . Hyperlipidemia with target LDL less than 100   . Abdominal aortic atherosclerosis ([place])   . Moderate essential hypertension 02/25/2011  . Arthropathy 02/25/2011  . HAV (hallux abducto valgus) 02/25/2011   Past Medical History:  Diagnosis Date  . Allergy   . Complication of anesthesia    Pt. stated he  had a reaction that ended in him requiring urinary cath placement  . Diverticulosis   . Glaucoma   . Gout   . Head injury, closed, with concussion   . Hepatitis C    chronic  . HLD (hyperlipidemia)    statin intolerant (Crestor & Simvastatin) - Taking Livalo 1mg  / week  . Hypertension   . Plantar fasciitis    right  . PVD (peripheral vascular disease) ([facility])    With no claudication; only mild abdominal aortic atherosclerosis noted on ultrasound.  [name] Ulcer     Family History  Problem Relation Age of Onset  . Heart disease Mother   . Cancer Paternal Grandfather   . Cancer Sister     Past Surgical History:  Procedure Laterality Date  . CARDIAC CATHETERIZATION  2005   30% Cx. Dr. Melvern Banker  . cataract surgery Left 11/05/2014  . COLONOSCOPY    . ESOPHAGOGASTRODUODENOSCOPY N/A 05/02/2015   Procedure: ESOPHAGOGASTRODUODENOSCOPY (EGD);  Surgeon: Ronald Lobo, MD;  Location: [facility];  Service: Endoscopy;  Laterality: N/A;  . ESOPHAGOGASTRODUODENOSCOPY  05/02/2015   no source of pt chest pain endoscopically evident. small hiatal hernia.  [name] EYE SURGERY    . MEMBRANE PEEL Left 03/14/2014   Procedure: MEMBRANE PEEL; ENDOLASER;  Surgeon: Hurman Horn, MD;  Location: [facility];  Service: Ophthalmology;  Laterality: Left;  . NM MYOVIEW LTD  2011  Neg Ischemia or infarct.  [name] NM MYOVIEW LTD  10/2015   LOW RISK. Small, fixed basal lateral defect - likely diaphragmatic attenuation. EF 69%  . PARS PLANA VITRECTOMY Left 03/14/2014   Procedure: PARS PLANA VITRECTOMY WITH 25 GAUGE;  Surgeon: Hurman Horn, MD;  Location: [facility];  Service: Ophthalmology;  Laterality: Left;  . TONSILLECTOMY    . TRANSTHORACIC ECHOCARDIOGRAM  2013   Normal EF. No significant Valve Disease  . UPPER GASTROINTESTINAL ENDOSCOPY     Social History   Occupational History  . self employed [employer]   Social History Main Topics  . Smoking status: Former Smoker    Types: Cigars    Quit date: 07/27/1988  . Smokeless  tobacco: Never Used  . Alcohol use 1.8 oz/week    1 Glasses of wine, 1 Cans of beer, 1 Shots of liquor per week     Comment: occasional  . Drug use: No  . Sexual activity: Yes

## 2017-03-19 MED FILL — ALFUZOSIN HCL ER 10 MG TAB: 10 | 90 days supply | Qty: 90 | Fill #1

## 2017-03-22 ENCOUNTER — Other Ambulatory Visit (INDEPENDENT_AMBULATORY_CARE_PROVIDER_SITE_OTHER): Payer: Self-pay | Admitting: Orthopaedic Surgery

## 2017-03-22 MED FILL — MELOXICAM 15 MG TABLET: 15 | 30 days supply | Qty: 30 | Fill #0

## 2017-03-23 ENCOUNTER — Encounter (INDEPENDENT_AMBULATORY_CARE_PROVIDER_SITE_OTHER): Payer: Self-pay | Admitting: Physical Medicine and Rehabilitation

## 2017-03-23 ENCOUNTER — Ambulatory Visit (INDEPENDENT_AMBULATORY_CARE_PROVIDER_SITE_OTHER): Payer: 59

## 2017-03-23 ENCOUNTER — Ambulatory Visit (INDEPENDENT_AMBULATORY_CARE_PROVIDER_SITE_OTHER): Payer: 59 | Admitting: Physical Medicine and Rehabilitation

## 2017-03-23 VITALS — BP 133/89 | HR 75

## 2017-03-23 DIAGNOSIS — M25552 Pain in left hip: Secondary | ICD-10-CM

## 2017-03-23 DIAGNOSIS — R202 Paresthesia of skin: Secondary | ICD-10-CM | POA: Diagnosis not present

## 2017-03-23 DIAGNOSIS — M25551 Pain in right hip: Secondary | ICD-10-CM | POA: Diagnosis not present

## 2017-03-23 DIAGNOSIS — M5416 Radiculopathy, lumbar region: Secondary | ICD-10-CM | POA: Diagnosis not present

## 2017-03-23 DIAGNOSIS — M48062 Spinal stenosis, lumbar region with neurogenic claudication: Secondary | ICD-10-CM

## 2017-03-23 MED ORDER — LIDOCAINE HCL (PF) 1 % IJ SOLN
2.0000 mL | Freq: Once | INTRAMUSCULAR | Status: AC
Start: 2017-03-23 — End: 2017-03-23
  Administered 2017-03-23: 2 mL

## 2017-03-23 MED ORDER — BETAMETHASONE SOD PHOS & ACET 6 (3-3) MG/ML IJ SUSP
12.0000 mg | Freq: Once | INTRAMUSCULAR | Status: AC
  Administered 2017-03-23: 12 mg

## 2017-03-23 MED FILL — PANTOPRAZOLE SOD DR 40 MG T: 40 | 90 days supply | Qty: 180 | Fill #0

## 2017-03-23 NOTE — Patient Instructions (Signed)

## 2017-03-23 NOTE — Progress Notes (Deleted)
Pain across lower back and worse on Left side for 2 to 3 months. Into groin on left side and down to ankle. Tightness in thigh at times. Some tingling in lower leg and into toes.

## 2017-03-26 ENCOUNTER — Encounter (INDEPENDENT_AMBULATORY_CARE_PROVIDER_SITE_OTHER): Payer: Self-pay | Admitting: Physical Medicine and Rehabilitation

## 2017-03-26 NOTE — Procedures (Signed)
Lumbosacral Transforaminal Epidural Steroid Injection - Sub-Pedicular Approach with Fluoroscopic Guidance  Patient: [name]      Date of Birth: [birth year] MRN: [id number] PCP: Denita Lung, MD      Visit Date: 03/23/2017   Universal Protocol:    Date/Time: 03/23/2017  Consent Given By: the patient  Position: PRONE  Additional Comments: Vital signs were monitored before and after the procedure. Patient was prepped and draped in the usual sterile fashion. The correct patient, procedure, and site was verified.   Injection Procedure Details:  Procedure Site One Meds Administered:  Meds ordered this encounter  Medications  . lidocaine (PF) (XYLOCAINE) 1 % injection 2 mL  . betamethasone acetate-betamethasone sodium phosphate (CELESTONE) injection 12 mg    Laterality: Left  Location/Site:  L4-L5  Needle size: 22 G  Needle type: Spinal  Needle Placement: Transforaminal  Findings:  -Contrast Used: 1 mL iohexol 180 mg iodine/mL   -Comments: Excellent flow of contrast along the nerve and into the epidural space.  Procedure Details: After squaring off the end-plates to get a true AP view, the C-arm was positioned so that an oblique view of the foramen as noted above was visualized. The target area is just inferior to the "nose of the scotty dog" or sub pedicular. The soft tissues overlying this structure were infiltrated with 2-3 ml. of 1% Lidocaine without Epinephrine.  The spinal needle was inserted toward the target using a "trajectory" view along the fluoroscope beam.  Under AP and lateral visualization, the needle was advanced so it did not puncture dura and was located close the 6 O'Clock position of the pedical in AP tracterory. Biplanar projections were used to confirm position. Aspiration was confirmed to be negative for CSF and/or blood. A 1-2 ml. volume of Isovue-250 was injected and flow of contrast was noted at each level. Radiographs were obtained for  documentation purposes.   After attaining the desired flow of contrast documented above, a 0.5 to 1.0 ml test dose of 0.25% Marcaine was injected into each respective transforaminal space.  The patient was observed for 90 seconds post injection.  After no sensory deficits were reported, and normal lower extremity motor function was noted,   the above injectate was administered so that equal amounts of the injectate were placed at each foramen (level) into the transforaminal epidural space.   Additional Comments:  The patient tolerated the procedure well Dressing: Band-Aid    Post-procedure details: Patient was observed during the procedure. Post-procedure instructions were reviewed.  Patient left the clinic in stable condition.

## 2017-03-26 NOTE — Progress Notes (Signed)
[name] - 66 y.o. male MRN [id number]  Date of birth: May 21, 1951  Office Visit Note: Visit Date: 03/23/2017 PCP: Denita Lung, MD Referred by: Denita Lung, MD  Subjective: Chief Complaint  Patient presents with  . Lower Back - Pain   HPI: [name] is a 66 year old gentleman with a year so complaint of bilateral hip pain. He's been treated with physical therapy and chiropractic care and medication management through his primary care physician and that he saw Dr. Erlinda Hong in our office. Dr. Erlinda Hong felt like this was more of a spine issue and ultimately did get an MRI of the lumbar spine which is reviewed below. Basically this showed moderately severe spinal stenosis both at L3-4 and L4-5. With Dr. Erlinda Hong he did not complain of any groin pain but today he does endorse groin pain on the left. He really talks about back pain worse on the left side for 2-3 months and worsening with pain into the groin on the left but also referral down to the ankle and somewhat of an L5 distribution. He gets tightness in the leg and hip. He feels like both legs give him a little bit of tingling in the toes. He has multiple medical problems including hypertension and coronary artery disease but does not carry a diagnosis of diabetes. He's had no prior lumbar surgery or injections. He really has not found relief with treatment so far. His pain is worse with standing and ambulating and better at rest. He is also seen a chiropractor.cases,. By Parkinson's.    Review of Systems  Constitutional: Negative for chills, fever, malaise/fatigue and weight loss.  HENT: Negative for hearing loss and sinus pain.   Eyes: Negative for blurred vision, double vision and photophobia.  Respiratory: Negative for cough and shortness of breath.   Cardiovascular: Negative for chest pain, palpitations and leg swelling.  Gastrointestinal: Negative for abdominal pain, nausea and vomiting.  Genitourinary: Negative for flank pain.    Musculoskeletal: Positive for back pain and joint pain. Negative for myalgias.  Skin: Negative for itching and rash.  Neurological: Positive for tingling. Negative for tremors, focal weakness and weakness.  Endo/Heme/Allergies: Negative.   Psychiatric/Behavioral: Negative for depression.  All other systems reviewed and are negative.  Otherwise per HPI.  Assessment & Plan: Visit Diagnoses:  1. Lumbar radiculopathy   2. Spinal stenosis of lumbar region with neurogenic claudication   3. Pain in left hip   4. Pain in right hip   5. Paresthesia of skin     Plan: Findings:  Chronic history of off and on back pain but now with significant bilateral hip pain for probably a year with 2-3 months of left hip and leg pain which is more radicular in nature. Worse with standing and walking consistent with stenosis. MRI evidence of moderately severe multifactorial stenosis at L3-4 with left disc protrusion and left subarticular stenosis that could affect the L4 nerve root on the left side. He also has stenosis at L4-5 as well. I avoid to complete a diagnostic and hopefully therapeutic injection at L4 on the left today.The injection  will be diagnostic and hopefully therapeutic. The patient has failed conservative care including time, medications and activity modification.    Meds & Orders:  Meds ordered this encounter  Medications  . lidocaine (PF) (XYLOCAINE) 1 % injection 2 mL  . betamethasone acetate-betamethasone sodium phosphate (CELESTONE) injection 12 mg    Orders Placed This Encounter  Procedures  . XR C-ARM NO REPORT  .  Epidural Steroid injection    Follow-up: Return if symptoms worsen or fail to improve.   Procedures: No procedures performed  Lumbosacral Transforaminal Epidural Steroid Injection - Sub-Pedicular Approach with Fluoroscopic Guidance  Patient: [name]      Date of Birth: [birth year] MRN: [id number] PCP: Denita Lung, MD      Visit Date: 03/23/2017    Universal Protocol:    Date/Time: 03/23/2017  Consent Given By: the patient  Position: PRONE  Additional Comments: Vital signs were monitored before and after the procedure. Patient was prepped and draped in the usual sterile fashion. The correct patient, procedure, and site was verified.   Injection Procedure Details:  Procedure Site One Meds Administered:  Meds ordered this encounter  Medications  . lidocaine (PF) (XYLOCAINE) 1 % injection 2 mL  . betamethasone acetate-betamethasone sodium phosphate (CELESTONE) injection 12 mg    Laterality: Left  Location/Site:  L4-L5  Needle size: 22 G  Needle type: Spinal  Needle Placement: Transforaminal  Findings:  -Contrast Used: 1 mL iohexol 180 mg iodine/mL   -Comments: Excellent flow of contrast along the nerve and into the epidural space.  Procedure Details: After squaring off the end-plates to get a true AP view, the C-arm was positioned so that an oblique view of the foramen as noted above was visualized. The target area is just inferior to the "nose of the scotty dog" or sub pedicular. The soft tissues overlying this structure were infiltrated with 2-3 ml. of 1% Lidocaine without Epinephrine.  The spinal needle was inserted toward the target using a "trajectory" view along the fluoroscope beam.  Under AP and lateral visualization, the needle was advanced so it did not puncture dura and was located close the 6 O'Clock position of the pedical in AP tracterory. Biplanar projections were used to confirm position. Aspiration was confirmed to be negative for CSF and/or blood. A 1-2 ml. volume of Isovue-250 was injected and flow of contrast was noted at each level. Radiographs were obtained for documentation purposes.   After attaining the desired flow of contrast documented above, a 0.5 to 1.0 ml test dose of 0.25% Marcaine was injected into each respective transforaminal space.  The patient was observed for 90 seconds post  injection.  After no sensory deficits were reported, and normal lower extremity motor function was noted,   the above injectate was administered so that equal amounts of the injectate were placed at each foramen (level) into the transforaminal epidural space.   Additional Comments:  The patient tolerated the procedure well Dressing: Band-Aid    Post-procedure details: Patient was observed during the procedure. Post-procedure instructions were reviewed.  Patient left the clinic in stable condition.       Clinical History: MRI LUMBAR SPINE WITHOUT CONTRAST  TECHNIQUE: Multiplanar, multisequence MR imaging of the lumbar spine was performed. No intravenous contrast was administered.  COMPARISON:  Lumbar spine radiographs 10/06/2016  FINDINGS: Segmentation:  Of normal  Alignment:  Normal  Vertebrae:  Negative for fracture or mass.  Conus medullaris: Extends to the mid L2 level and appears normal.  Paraspinal and other soft tissues: 4.9 cm right upper pole renal cyst. 4.8 cm left midpole cyst. No retroperitoneal adenopathy. Small retroperitoneal cysts are located between the aorta and vena cava.  Disc levels:  L1-2:  Negative  L2-3: Mild disc degeneration. Mild disc bulging and endplate spurring without significant stenosis  L3-4: Moderately severe spinal stenosis. Disc degeneration with disc bulging and diffuse endplate spurring. Shallow broad-based disc  protrusion on the left causing marked left subarticular stenosis and impingement of the left L4 nerve root. Small synovial cyst on left also contributes to subarticular stenosis on the left. Bilateral facet hypertrophy  L4-5: Moderately severe spinal stenosis due to diffuse disc bulging and facet hypertrophy. Mild subarticular stenosis bilaterally  L5-S1:  Moderate disc degeneration without significant stenosis.  IMPRESSION: Moderately severe spinal stenosis L3-4. Left-sided disc protrusion and  synovial cyst contributing to subarticular stenosis on the left  Moderately severe spinal stenosis L4-5   Electronically Signed   By: Franchot Gallo M.D.   On: 02/22/2017 11:34  He reports that he quit smoking about 28 years ago. His smoking use included Cigars. He has never used smokeless tobacco. No results for input(s): HGBA1C, LABURIC in the last 8760 hours.  Objective:  VS:  HT:    WT:   BMI:     BP:133/89  HR:75bpm  TEMP: ( )  RESP:97 % Physical Exam  Constitutional: He is oriented to person, place, and time. He appears well-developed and well-nourished. No distress.  HENT:  Head: Normocephalic and atraumatic.  Eyes: Pupils are equal, round, and reactive to light. Conjunctivae are normal.  Neck: Normal range of motion. Neck supple.  Cardiovascular: Regular rhythm and intact distal pulses.   Pulmonary/Chest: Effort normal. No respiratory distress.  Musculoskeletal:  Patient ambulates with forward flexed spine. He is stiff to the lumbar spine with extension. He has no pain over the greater trochanters. He has no pain with hip rotation is good distal strength. He has no clonus bilaterally.  Neurological: He is alert and oriented to person, place, and time. No cranial nerve deficit.  Skin: Skin is warm and dry. No rash noted. No erythema.  Psychiatric: He has a normal mood and affect.  Nursing note and vitals reviewed.   Ortho Exam Imaging: No results found.  Past Medical/Family/Surgical/Social History: Medications & Allergies reviewed per EMR Patient Active Problem List   Diagnosis Date Noted  . Abnormal laboratory test 10/09/2016  . Medication management 10/09/2016  . Plantar fasciitis of right foot 02/19/2015  . Depression 08/14/2014  . Family history of heart disease in male family member before age 90 04/26/2014  . Mild cognitive impairment 03/13/2014  . 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 Hyperlipidemia with target LDL less than 100   . Abdominal  aortic atherosclerosis ([facility])   . Moderate essential hypertension 02/25/2011  . Arthropathy 02/25/2011  . HAV (hallux abducto valgus) 02/25/2011   Past Medical History:  Diagnosis Date  . Allergy   . Complication of anesthesia    Pt. stated he had a reaction that ended in him requiring urinary cath placement  . Diverticulosis   . Glaucoma   . Gout   . Head injury, closed, with concussion   . Hepatitis C    chronic  . HLD (hyperlipidemia)    statin intolerant (Crestor & Simvastatin) - Taking Livalo 1mg  / week  . Hypertension   . Plantar fasciitis    right  . PVD (peripheral vascular disease) ([facility])    With no claudication; only mild abdominal aortic atherosclerosis noted on ultrasound.  [name] Ulcer    Family History  Problem Relation Age of Onset  . Heart disease Mother   . Cancer Paternal Grandfather   . Cancer Sister    Past Surgical History:  Procedure Laterality Date  . CARDIAC CATHETERIZATION  2005   30% Cx. Dr. Melvern Banker  . cataract surgery Left 11/05/2014  . COLONOSCOPY    .  ESOPHAGOGASTRODUODENOSCOPY N/A 05/02/2015   Procedure: ESOPHAGOGASTRODUODENOSCOPY (EGD);  Surgeon: Ronald Lobo, MD;  Location: [facility];  Service: Endoscopy;  Laterality: N/A;  . ESOPHAGOGASTRODUODENOSCOPY  05/02/2015   no source of pt chest pain endoscopically evident. small hiatal hernia.  [name] EYE SURGERY    . MEMBRANE PEEL Left 03/14/2014   Procedure: MEMBRANE PEEL; ENDOLASER;  Surgeon: Hurman Horn, MD;  Location: [facility];  Service: Ophthalmology;  Laterality: Left;  . NM MYOVIEW LTD  2011   Neg Ischemia or infarct.  [name] NM MYOVIEW LTD  10/2015   LOW RISK. Small, fixed basal lateral defect - likely diaphragmatic attenuation. EF 69%  . PARS PLANA VITRECTOMY Left 03/14/2014   Procedure: PARS PLANA VITRECTOMY WITH 25 GAUGE;  Surgeon: Hurman Horn, MD;  Location: [facility];  Service: Ophthalmology;  Laterality: Left;  . TONSILLECTOMY    . TRANSTHORACIC ECHOCARDIOGRAM  2013   Normal EF. No significant  Valve Disease  . UPPER GASTROINTESTINAL ENDOSCOPY     Social History   Occupational History  . self employed [employer]   Social History Main Topics  . Smoking status: Former Smoker    Types: Cigars    Quit date: 07/27/1988  . Smokeless tobacco: Never Used  . Alcohol use 1.8 oz/week    1 Glasses of wine, 1 Cans of beer, 1 Shots of liquor per week     Comment: occasional  . Drug use: No  . Sexual activity: Yes

## 2017-03-31 ENCOUNTER — Telehealth (INDEPENDENT_AMBULATORY_CARE_PROVIDER_SITE_OTHER): Payer: Self-pay

## 2017-03-31 NOTE — Telephone Encounter (Signed)
Please advise 

## 2017-03-31 NOTE — Telephone Encounter (Signed)
yes

## 2017-03-31 NOTE — Telephone Encounter (Signed)
Patient would like a Rx for Voltaren Gel.  Cb# is [phone].  Please advise.  Thank you.

## 2017-04-01 MED ORDER — DICLOFENAC SODIUM 1 % TD GEL: 2.0000 g | Freq: Four times a day (QID) | TRANSDERMAL | 0 refills | Status: DC

## 2017-04-01 MED FILL — DICLOFENAC SODIUM 1% GEL: 1 | 10 days supply | Qty: 100 | Fill #0

## 2017-04-01 NOTE — Addendum Note (Signed)
Addended by: Precious Bard on: 04/01/2017 03:10 PM   Modules accepted: Orders

## 2017-04-01 NOTE — Telephone Encounter (Signed)
Sent Rx to his pharm.

## 2017-04-02 ENCOUNTER — Other Ambulatory Visit: Payer: Self-pay | Admitting: *Deleted

## 2017-04-02 ENCOUNTER — Ambulatory Visit (INDEPENDENT_AMBULATORY_CARE_PROVIDER_SITE_OTHER): Payer: 59 | Admitting: Orthopaedic Surgery

## 2017-04-02 DIAGNOSIS — M48062 Spinal stenosis, lumbar region with neurogenic claudication: Secondary | ICD-10-CM | POA: Diagnosis not present

## 2017-04-02 MED ORDER — TRAMADOL HCL 50 MG PO TABS: 50.0000 mg | ORAL_TABLET | Freq: Four times a day (QID) | ORAL | 0 refills | Status: DC | PRN

## 2017-04-02 MED ORDER — VERAPAMIL HCL ER 240 MG PO TBCR: 240.0000 mg | EXTENDED_RELEASE_TABLET | Freq: Every day | ORAL | 1 refills | Status: DC

## 2017-04-02 MED FILL — traMADol HCL 50 MG TABS: 50 | 8 days supply | Qty: 30 | Fill #0

## 2017-04-02 MED FILL — VERAPAMIL ER 240 MG TABLET: 240 | 90 days supply | Qty: 90 | Fill #0

## 2017-04-02 NOTE — Addendum Note (Signed)
Addended by: Precious Bard on: 04/02/2017 10:01 AM   Modules accepted: Orders

## 2017-04-02 NOTE — Telephone Encounter (Signed)
Rx has been sent to the pharmacy electronically. ° °

## 2017-04-02 NOTE — Progress Notes (Signed)
Office Visit Note   Patient: [name]           Date of Birth: March 17, 1951           MRN: [id number] Visit Date: 04/02/2017              Requested by: Denita Lung, MD [address] [place], [place] [address] PCP: Denita Lung, MD   Assessment & Plan: Visit Diagnoses:  1. Spinal stenosis of lumbar region with neurogenic claudication     Plan: Patient has failed epidural steroid injection. He has moderate to severe lumbar spinal stenosis.  Referral to Dr. Kathyrn Sheriff for surgical evaluation. Total face to face encounter time was greater than 25 minutes and over half of this time was spent in counseling and/or coordination of care.  Follow-Up Instructions: Return if symptoms worsen or fail to improve.   Orders:  No orders of the defined types were placed in this encounter.  Meds ordered this encounter  Medications  . traMADol (ULTRAM) 50 MG tablet    Sig: Take 1 tablet (50 mg total) by mouth every 6 (six) hours as needed.    Dispense:  30 tablet    Refill:  0      Procedures: No procedures performed   Clinical Data: No additional findings.   Subjective: Chief Complaint  Patient presents with  . Lower Back - Pain, Follow-up    Patient follows up today for continued lumbar radiculopathy and spinal stenosis. He had good relief on the day that he had the injection but since then he has had worsening pain. He cannot tolerate the meloxicam.    Review of Systems  Constitutional: Negative.   All other systems reviewed and are negative.    Objective: Vital Signs: There were no vitals taken for this visit.  Physical Exam  Constitutional: He is oriented to person, place, and time. He appears well-developed and well-nourished.  Pulmonary/Chest: Effort normal.  Abdominal: Soft.  Neurological: He is alert and oriented to person, place, and time.  Skin: Skin is warm.  Psychiatric: He has a normal mood and affect. His behavior is normal. Judgment and  thought content normal.  Nursing note and vitals reviewed.   Ortho Exam Exam is stable Specialty Comments:  No specialty comments available.  Imaging: No results found.   PMFS History: Patient Active Problem List   Diagnosis Date Noted  . 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  . Abnormal laboratory test 10/09/2016  . Medication management 10/09/2016  . Plantar fasciitis of right foot 02/19/2015  . Depression 08/14/2014  . Family history of heart disease in male family member before age 63 04/26/2014  . Mild cognitive impairment 03/13/2014  . Parkinsonian tremor ([place]) 02/12/2014  . Hyperlipidemia with target LDL less than 100   . Abdominal aortic atherosclerosis ([place])   . Moderate essential hypertension 02/25/2011  . Arthropathy 02/25/2011  . HAV (hallux abducto valgus) 02/25/2011   Past Medical History:  Diagnosis Date  . Allergy   . Complication of anesthesia    Pt. stated he had a reaction that ended in him requiring urinary cath placement  . Diverticulosis   . Glaucoma   . Gout   . Head injury, closed, with concussion   . Hepatitis C    chronic  . HLD (hyperlipidemia)    statin intolerant (Crestor & Simvastatin) - Taking Livalo 1mg  / week  . Hypertension   . Plantar fasciitis    right  .  PVD (peripheral vascular disease) ([place])    With no claudication; only mild abdominal aortic atherosclerosis noted on ultrasound.  [name] Ulcer     Family History  Problem Relation Age of Onset  . Heart disease Mother   . Cancer Paternal Grandfather   . Cancer Sister     Past Surgical History:  Procedure Laterality Date  . CARDIAC CATHETERIZATION  2005   30% Cx. Dr. Melvern Banker  . cataract surgery Left 11/05/2014  . COLONOSCOPY    . ESOPHAGOGASTRODUODENOSCOPY N/A 05/02/2015   Procedure: ESOPHAGOGASTRODUODENOSCOPY (EGD);  Surgeon: Ronald Lobo, MD;  Location: [facility];  Service: Endoscopy;  Laterality: N/A;  . ESOPHAGOGASTRODUODENOSCOPY   05/02/2015   no source of pt chest pain endoscopically evident. small hiatal hernia.  [name] EYE SURGERY    . MEMBRANE PEEL Left 03/14/2014   Procedure: MEMBRANE PEEL; ENDOLASER;  Surgeon: Hurman Horn, MD;  Location: [facility];  Service: Ophthalmology;  Laterality: Left;  . NM MYOVIEW LTD  2011   Neg Ischemia or infarct.  [name] NM MYOVIEW LTD  10/2015   LOW RISK. Small, fixed basal lateral defect - likely diaphragmatic attenuation. EF 69%  . PARS PLANA VITRECTOMY Left 03/14/2014   Procedure: PARS PLANA VITRECTOMY WITH 25 GAUGE;  Surgeon: Hurman Horn, MD;  Location: [facility];  Service: Ophthalmology;  Laterality: Left;  . TONSILLECTOMY    . TRANSTHORACIC ECHOCARDIOGRAM  2013   Normal EF. No significant Valve Disease  . UPPER GASTROINTESTINAL ENDOSCOPY     Social History   Occupational History  . self employed [employer]   Social History Main Topics  . Smoking status: Former Smoker    Types: Cigars    Quit date: 07/27/1988  . Smokeless tobacco: Never Used  . Alcohol use 1.8 oz/week    1 Glasses of wine, 1 Cans of beer, 1 Shots of liquor per week     Comment: occasional  . Drug use: No  . Sexual activity: Yes

## 2017-04-05 ENCOUNTER — Telehealth: Payer: Self-pay | Admitting: Neurology

## 2017-04-05 ENCOUNTER — Other Ambulatory Visit: Payer: Self-pay

## 2017-04-05 MED ORDER — IRBESARTAN 150 MG PO TABS: 150.0000 mg | ORAL_TABLET | Freq: Every day | ORAL | 2 refills | Status: DC

## 2017-04-05 MED FILL — IRBESARTAN 150 MG TABLET: 150 | 90 days supply | Qty: 90 | Fill #0

## 2017-04-05 NOTE — Telephone Encounter (Signed)
Patient called office in reference to a referral to Neurosurgery.  Please send referral to Dr. Rita Ohara at [facility].  FYI

## 2017-04-07 NOTE — Telephone Encounter (Signed)
Called patient and spoke to him patient just wanted to Know Physicians names at [facility] . Referral was not placed from our office. I relayed to patient if he had any questions he could  call the referring office. Patient relayed that he got confused with his doctors offices.  Patient understood details.

## 2017-04-13 DIAGNOSIS — H35352 Cystoid macular degeneration, left eye: Secondary | ICD-10-CM | POA: Diagnosis not present

## 2017-04-13 DIAGNOSIS — H35372 Puckering of macula, left eye: Secondary | ICD-10-CM | POA: Diagnosis not present

## 2017-04-13 DIAGNOSIS — H34812 Central retinal vein occlusion, left eye, with macular edema: Secondary | ICD-10-CM | POA: Diagnosis not present

## 2017-04-21 ENCOUNTER — Other Ambulatory Visit (INDEPENDENT_AMBULATORY_CARE_PROVIDER_SITE_OTHER): Payer: Self-pay | Admitting: Orthopaedic Surgery

## 2017-04-21 DIAGNOSIS — Z6826 Body mass index (BMI) 26.0-26.9, adult: Secondary | ICD-10-CM | POA: Diagnosis not present

## 2017-04-21 DIAGNOSIS — M549 Dorsalgia, unspecified: Secondary | ICD-10-CM | POA: Diagnosis not present

## 2017-04-21 DIAGNOSIS — M48062 Spinal stenosis, lumbar region with neurogenic claudication: Secondary | ICD-10-CM | POA: Diagnosis not present

## 2017-04-21 DIAGNOSIS — R030 Elevated blood-pressure reading, without diagnosis of hypertension: Secondary | ICD-10-CM | POA: Diagnosis not present

## 2017-04-21 NOTE — Telephone Encounter (Signed)
[place]

## 2017-04-23 MED FILL — traMADol HCL 50 MG TABS: 50 | 8 days supply | Qty: 30 | Fill #0

## 2017-04-23 NOTE — Telephone Encounter (Signed)
Called into pharm  

## 2017-04-28 ENCOUNTER — Telehealth (INDEPENDENT_AMBULATORY_CARE_PROVIDER_SITE_OTHER): Payer: Self-pay | Admitting: Physical Medicine and Rehabilitation

## 2017-04-28 ENCOUNTER — Telehealth (INDEPENDENT_AMBULATORY_CARE_PROVIDER_SITE_OTHER): Payer: Self-pay | Admitting: Orthopaedic Surgery

## 2017-04-28 ENCOUNTER — Other Ambulatory Visit (INDEPENDENT_AMBULATORY_CARE_PROVIDER_SITE_OTHER): Payer: Self-pay

## 2017-04-28 DIAGNOSIS — M545 Low back pain: Principal | ICD-10-CM

## 2017-04-28 DIAGNOSIS — G8929 Other chronic pain: Secondary | ICD-10-CM

## 2017-04-28 NOTE — Telephone Encounter (Signed)
See message below  Please advise

## 2017-04-28 NOTE — Telephone Encounter (Signed)
Patient called stating the injection from Dr. Ernestina Patches did not work and he has seen the neurosurgereon and talked about his options.  He wants to know what he needs to do, he is unable to walk around the house, he does not want to have surgery right now.  Wants to know if he can have another cortisone shot.  [phone].  Thank you.

## 2017-04-28 NOTE — Telephone Encounter (Signed)
Order made

## 2017-04-28 NOTE — Telephone Encounter (Signed)
Yes can have another injection with Dr. Ernestina Patches.

## 2017-04-29 ENCOUNTER — Other Ambulatory Visit: Payer: Self-pay | Admitting: Neurosurgery

## 2017-04-29 DIAGNOSIS — M48062 Spinal stenosis, lumbar region with neurogenic claudication: Secondary | ICD-10-CM

## 2017-04-29 NOTE — Telephone Encounter (Signed)
Please advise see message below

## 2017-04-29 NOTE — Telephone Encounter (Signed)
I have spoken with patient and he says that he called Dr Kathyrn Sheriff, and he referred him to [facility] so he is already scheduled for an appt.  No further action needed.

## 2017-04-29 NOTE — Telephone Encounter (Signed)
ok 

## 2017-04-29 NOTE — Telephone Encounter (Signed)
I have spoken with patient and he says that he called Dr Kathyrn Sheriff, and he referred him to [facility] so he is already scheduled for an appt.  No further action needed.

## 2017-05-03 ENCOUNTER — Other Ambulatory Visit (INDEPENDENT_AMBULATORY_CARE_PROVIDER_SITE_OTHER): Payer: Self-pay | Admitting: Orthopaedic Surgery

## 2017-05-03 MED FILL — CARBIDOPA-LEVODOPA 25-100 T: 25-100 | 90 days supply | Qty: 405 | Fill #0

## 2017-05-03 NOTE — Telephone Encounter (Signed)
approve

## 2017-05-04 MED FILL — traMADol HCL 50 MG TABS: 50 | 7 days supply | Qty: 30 | Fill #0

## 2017-05-04 NOTE — Telephone Encounter (Signed)
Called into pharm  

## 2017-05-05 ENCOUNTER — Other Ambulatory Visit (INDEPENDENT_AMBULATORY_CARE_PROVIDER_SITE_OTHER): Payer: 59

## 2017-05-05 DIAGNOSIS — Z23 Encounter for immunization: Secondary | ICD-10-CM

## 2017-05-12 ENCOUNTER — Ambulatory Visit
Admission: RE | Admit: 2017-05-12 | Discharge: 2017-05-12 | Disposition: A | Discharge status: Home / Self Care | Payer: 59 | Source: Ambulatory Visit | Attending: Neurosurgery | Admitting: Neurosurgery

## 2017-05-12 DIAGNOSIS — M48062 Spinal stenosis, lumbar region with neurogenic claudication: Secondary | ICD-10-CM | POA: Diagnosis not present

## 2017-05-12 MED ORDER — METHYLPREDNISOLONE ACETATE 40 MG/ML INJ SUSP (RADIOLOG
120.0000 mg | Freq: Once | INTRAMUSCULAR | Status: AC
  Administered 2017-05-12: 120 mg via EPIDURAL

## 2017-05-12 MED ORDER — IOPAMIDOL (ISOVUE-M 200) INJECTION 41%
1.0000 mL | Freq: Once | INTRAMUSCULAR | Status: AC
  Administered 2017-05-12: 1 mL via EPIDURAL

## 2017-05-12 NOTE — Discharge Instructions (Signed)

## 2017-05-14 DIAGNOSIS — N50812 Left testicular pain: Secondary | ICD-10-CM | POA: Diagnosis not present

## 2017-05-14 MED FILL — MELOXICAM 7.5 MG TABLET: 7.5 | 14 days supply | Qty: 28 | Fill #0

## 2017-05-17 ENCOUNTER — Telehealth: Payer: Self-pay | Admitting: Neurology

## 2017-05-17 NOTE — Telephone Encounter (Signed)
Kindly inform the patient that I do not believe increasing Sinemet is necessary. If restarting tramadol improves his tremors he does not need change in medications

## 2017-05-17 NOTE — Telephone Encounter (Signed)
Patient called and requested to speak with the nurse regarding some pain he has been having in his back. He was taking tramadol but he has discontinued that. He is having some increased symptoms and would like to discuss this with the nurse. Please call and advise.

## 2017-05-17 NOTE — Telephone Encounter (Signed)
Rn call patient back about his concerns taking tramadol. Pt stated he is seeing orthopedic,and [place] neurosurgery for his back pain. PT stated he receive a steroid shot for the back pain last Wednesday. Also he notice since he discontinue the tramadol, and receive the steroid shot he has had increase tremors. He read on line that tramadol can increase parkinson or tremors.  He wanted to know from Dr. [name] should his sinemet be increase or will the tremors subsided over time. Rn also recommend he call the MD who prescribe the tramadol,and gave him the shot last week of his concerns. Rn stated to patient Dr. [name] is seeing patients today,and will review his chart. Pt verbalized understanding.

## 2017-05-18 NOTE — Telephone Encounter (Signed)
Left vm for patient to call back about Dr. [name] advice on his sinemet,and if the tramadol cause the increase tremors.

## 2017-05-19 NOTE — Telephone Encounter (Signed)
Rn call patient that per Dr. [name] he does not want to change the current dosage or regimen on the sinemet. Pt stated if his tremors get worse he will call us back for an earlier appt. Pt verbalized understanding. Pt was taking tramadol and read it can cause tremors.

## 2017-06-02 ENCOUNTER — Other Ambulatory Visit: Payer: Self-pay | Admitting: Cardiology

## 2017-06-02 MED FILL — NITROGLYCERIN 0.4 MG TAB SL: 0.4 | 15 days supply | Qty: 25 | Fill #0

## 2017-06-02 MED FILL — ALFUZOSIN HCL ER 10 MG TAB: 10 | 90 days supply | Qty: 90 | Fill #2

## 2017-06-02 MED FILL — LIVALO 1 MG TABLET: 1 | 84 days supply | Qty: 24 | Fill #1

## 2017-06-02 NOTE — Telephone Encounter (Signed)
REFILL 

## 2017-06-04 DIAGNOSIS — H4720 Unspecified optic atrophy: Secondary | ICD-10-CM | POA: Diagnosis not present

## 2017-06-04 DIAGNOSIS — H34812 Central retinal vein occlusion, left eye, with macular edema: Secondary | ICD-10-CM | POA: Diagnosis not present

## 2017-06-04 DIAGNOSIS — H35352 Cystoid macular degeneration, left eye: Secondary | ICD-10-CM | POA: Diagnosis not present

## 2017-06-04 DIAGNOSIS — H34832 Tributary (branch) retinal vein occlusion, left eye, with macular edema: Secondary | ICD-10-CM | POA: Diagnosis not present

## 2017-06-15 MED FILL — TIMOLOL 0.5% EYE DROPS: 0.5 | 90 days supply | Qty: 5 | Fill #2

## 2017-06-15 MED FILL — NITROGLYCERIN 0.4 MG TAB SL: 0.4 | 15 days supply | Qty: 25 | Fill #1

## 2017-07-01 ENCOUNTER — Telehealth (INDEPENDENT_AMBULATORY_CARE_PROVIDER_SITE_OTHER): Payer: Self-pay | Admitting: Orthopedic Surgery

## 2017-07-01 ENCOUNTER — Telehealth: Payer: Self-pay | Admitting: Family Medicine

## 2017-07-01 ENCOUNTER — Telehealth: Payer: Self-pay | Admitting: Podiatry

## 2017-07-01 NOTE — Telephone Encounter (Signed)
Pt called and is wanting to come in for the pneumoccal shot he wanted to make sure he needed this and that he can come in pt can be reached at [phone]

## 2017-07-01 NOTE — Telephone Encounter (Signed)
He needs  One more shot

## 2017-07-01 NOTE — Telephone Encounter (Signed)
I was seen there by Dr. Jacqualyn Posey several months ago. I'm wanting those x-rays of my right foot to be transferred to a provider at [facility]. You can reach me at [phone].

## 2017-07-01 NOTE — Telephone Encounter (Signed)
Called pt and left message letting him know he would need to fill out and sign a medical records release form and that there is a $5.00 charge to burn the x-rays to a CD. I told him if the provider is part of [facility] and on Epic they should be able to view his x-rays. I asked pt to call me back directly at [phone].

## 2017-07-01 NOTE — Telephone Encounter (Signed)
Pt aware he is due for pcv 23. appt scheduled. [name]

## 2017-07-01 NOTE — Telephone Encounter (Signed)
Pt called and he said Dr.Duda can read his x-rays. Pt stated they are available in his chart.

## 2017-07-02 ENCOUNTER — Other Ambulatory Visit: Payer: 59

## 2017-07-02 NOTE — Telephone Encounter (Signed)
Yes we will have Dr. Sharol Given review patients xrays from earlier this year, has upcoming appointment, can discuss at that time.

## 2017-07-05 ENCOUNTER — Ambulatory Visit (INDEPENDENT_AMBULATORY_CARE_PROVIDER_SITE_OTHER): Payer: 59 | Admitting: Orthopedic Surgery

## 2017-07-12 MED FILL — VERAPAMIL ER 240 MG TABLET: 240 | 90 days supply | Qty: 90 | Fill #1

## 2017-07-12 MED FILL — IRBESARTAN 150 MG TABLET: 150 | 90 days supply | Qty: 90 | Fill #1

## 2017-07-14 ENCOUNTER — Ambulatory Visit (INDEPENDENT_AMBULATORY_CARE_PROVIDER_SITE_OTHER): Payer: 59 | Admitting: Orthopedic Surgery

## 2017-07-14 ENCOUNTER — Encounter (INDEPENDENT_AMBULATORY_CARE_PROVIDER_SITE_OTHER): Payer: Self-pay | Admitting: Orthopedic Surgery

## 2017-07-14 VITALS — Ht 70.0 in | Wt 173.0 lb

## 2017-07-14 DIAGNOSIS — M205X1 Other deformities of toe(s) (acquired), right foot: Secondary | ICD-10-CM

## 2017-07-14 DIAGNOSIS — M21611 Bunion of right foot: Secondary | ICD-10-CM | POA: Diagnosis not present

## 2017-07-14 NOTE — Progress Notes (Signed)
Office Visit Note   Patient: [name]           Date of Birth: [birth year]           MRN: [id number] Visit Date: 07/14/2017              Requested by: Denita Lung, MD [address] [place], [place] [address] PCP: Denita Lung, MD  Chief Complaint  Patient presents with  . Right Foot - Pain      HPI: Patient is a 66 year old gentleman who presents in follow-up for painful bunion and clawing of the second toe with metatarsalgia beneath the second metatarsal head.  Patient was seen and a Weil ago and did not want to proceed with surgery he did obtain a second opinion with Dr. Carman Ching who recommended surgical intervention as well.  Patient states the prominence beneath the second metatarsal head is becoming more painful.  Patient states he has had one episode of gout in the past but has had no flareups.  Patient also states he has some mild Parkinson's.  Patient states he occasionally smokes cigars.  Assessment & Plan: Visit Diagnoses:  1. Bunion of great toe of right foot   2. Claw toe, acquired, right     Plan: Recommended proceeding with surgical intervention.  Discussed that would recommend proceeding with a chevron osteotomy for the first metatarsal Weil osteotomy for the second metatarsal.  The degenerative changes of the second metatarsal joint may be due to gout.  Recommended nonweightbearing for 2-3 weeks and then advance to a stiff soled Trail running sneaker.  Risks and benefits of surgery were discussed patient states he understands and would like to proceed after the first of the year.  Follow-Up Instructions: Return in about 2 weeks (around 07/28/2017).   Ortho Exam  Patient is alert, oriented, no adenopathy, well-dressed, normal affect, normal respiratory effort. Examination patient does have an antalgic gait his shoe was worn laterally from unloading pressure from the first and second metatarsal head.  He has a good dorsalis pedis pulse good  ankle good subtalar motion dorsiflexion 20 degrees past neutral with his knee extended.  He has tenderness to palpation of the medial eminence of his bunion he does not have hallux rigidus.  He is point tender to palpation over the second metatarsal head with swelling around the joint consistent with degenerative changes or possible chronic gout.  Imaging: No results found. No images are attached to the encounter.  Labs: No results found for: HGBA1C, ESRSEDRATE, CRP, LABURIC, REPTSTATUS, GRAMSTAIN, CULT, LABORGA  @LABSALLVALUES (HGBA1)@  Body mass index is 24.82 kg/m.  Orders:  No orders of the defined types were placed in this encounter.  No orders of the defined types were placed in this encounter.    Procedures: No procedures performed  Clinical Data: No additional findings.  ROS:  All other systems negative, except as noted in the HPI. Review of Systems  Objective: Vital Signs: Ht 5\' 10"  (1.778 m)   Wt 173 lb (78.5 kg)   BMI 24.82 kg/m   Specialty Comments:  No specialty comments available.  PMFS History: Patient Active Problem List   Diagnosis Date Noted  . Bunion of great toe of right foot 07/14/2017  . Claw toe, acquired, right 07/14/2017  . 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  . Abnormal laboratory test 10/09/2016  . Medication management 10/09/2016  . Plantar fasciitis of right foot 02/19/2015  . Depression 08/14/2014  . Family history  of heart disease in male family member before age 64 04/26/2014  . Mild cognitive impairment 03/13/2014  . 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 Hyperlipidemia with target LDL less than 100   . Abdominal aortic atherosclerosis ([place])   . Moderate essential hypertension 02/25/2011  . Arthropathy 02/25/2011  . HAV (hallux abducto valgus) 02/25/2011   Past Medical History:  Diagnosis Date  . Allergy   . Complication of anesthesia    Pt. stated he had a reaction that ended in him  requiring urinary cath placement  . Diverticulosis   . Glaucoma   . Gout   . Head injury, closed, with concussion   . Hepatitis C    chronic  . HLD (hyperlipidemia)    statin intolerant (Crestor & Simvastatin) - Taking Livalo 1mg  / week  . Hypertension   . Plantar fasciitis    right  . PVD (peripheral vascular disease) ([place])    With no claudication; only mild abdominal aortic atherosclerosis noted on ultrasound.  [name] Ulcer     Family History  Problem Relation Age of Onset  . Heart disease Mother   . Cancer Paternal Grandfather   . Cancer Sister     Past Surgical History:  Procedure Laterality Date  . CARDIAC CATHETERIZATION  2005   30% Cx. Dr. Melvern Banker  . cataract surgery Left 11/05/2014  . COLONOSCOPY    . ESOPHAGOGASTRODUODENOSCOPY N/A 05/02/2015   Procedure: ESOPHAGOGASTRODUODENOSCOPY (EGD);  Surgeon: Ronald Lobo, MD;  Location: [facility];  Service: Endoscopy;  Laterality: N/A;  . ESOPHAGOGASTRODUODENOSCOPY  05/02/2015   no source of pt chest pain endoscopically evident. small hiatal hernia.  [name] EYE SURGERY    . MEMBRANE PEEL Left 03/14/2014   Procedure: MEMBRANE PEEL; ENDOLASER;  Surgeon: Hurman Horn, MD;  Location: [facility];  Service: Ophthalmology;  Laterality: Left;  . NM MYOVIEW LTD  2011   Neg Ischemia or infarct.  [name] NM MYOVIEW LTD  10/2015   LOW RISK. Small, fixed basal lateral defect - likely diaphragmatic attenuation. EF 69%  . PARS PLANA VITRECTOMY Left 03/14/2014   Procedure: PARS PLANA VITRECTOMY WITH 25 GAUGE;  Surgeon: Hurman Horn, MD;  Location: [facility];  Service: Ophthalmology;  Laterality: Left;  . TONSILLECTOMY    . TRANSTHORACIC ECHOCARDIOGRAM  2013   Normal EF. No significant Valve Disease  . UPPER GASTROINTESTINAL ENDOSCOPY     Social History   Occupational History  . 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 Smoking status: Former Smoker    Types: Cigars    Last attempt to quit: 07/27/1988    Years since quitting:  28.9  . Smokeless tobacco: Never Used  Substance and Sexual Activity  . Alcohol use: Yes    Alcohol/week: 1.8 oz    Types: 1 Glasses of wine, 1 Cans of beer, 1 Shots of liquor per week    Comment: occasional  . Drug use: No  . Sexual activity: Yes

## 2017-07-16 ENCOUNTER — Other Ambulatory Visit (INDEPENDENT_AMBULATORY_CARE_PROVIDER_SITE_OTHER): Payer: 59

## 2017-07-16 DIAGNOSIS — Z23 Encounter for immunization: Secondary | ICD-10-CM | POA: Diagnosis not present

## 2017-08-03 DIAGNOSIS — H34832 Tributary (branch) retinal vein occlusion, left eye, with macular edema: Secondary | ICD-10-CM | POA: Diagnosis not present

## 2017-08-03 DIAGNOSIS — H34812 Central retinal vein occlusion, left eye, with macular edema: Secondary | ICD-10-CM | POA: Diagnosis not present

## 2017-08-03 DIAGNOSIS — H35372 Puckering of macula, left eye: Secondary | ICD-10-CM | POA: Diagnosis not present

## 2017-08-03 DIAGNOSIS — H35352 Cystoid macular degeneration, left eye: Secondary | ICD-10-CM | POA: Diagnosis not present

## 2017-08-03 DIAGNOSIS — H4720 Unspecified optic atrophy: Secondary | ICD-10-CM | POA: Diagnosis not present

## 2017-08-05 ENCOUNTER — Other Ambulatory Visit (INDEPENDENT_AMBULATORY_CARE_PROVIDER_SITE_OTHER): Payer: Self-pay | Admitting: Family

## 2017-08-09 MED FILL — CARBIDOPA-LEVODOPA 25-100 T: 25-100 | 90 days supply | Qty: 405 | Fill #1

## 2017-08-16 NOTE — Progress Notes (Signed)
[facility] PATIENT: [name] DOB: Mar 16, 1951   REASON FOR VISIT: Follow-up for tremor mild cognitive impairment, depression HISTORY FROM: Patient  HISTORY OF PRESENT ILLNESS:HISTORY:63 year African American male who since last year and a half has noticed increasing tremors mainly in the left arm and leg and to a lesser degree in the right arm as well. He states the tremors were quite mild but became more pronounced after a minor accident while staying [place] the rental [place] in [place]. He fell down 12 flights of steps and hit a concrete slab and had a concussion. He and some minor bruises and knee injury which took several months to reck of her period CT scan of the head was done 2 days later which was unremarkable. Since then he has had some walking difficulty but this may be related to his knee pain. He says that his feet do at times gets stuck in his started walking with a stooped posture. He however does not describe typical festination. He has resting and intermittent action tremor claiming the left arm and leg the tremor does improve with activities. The tremor does not seem to interfere with most of his routine. He denies significant bradykinesia, and drooling of saliva or micrographia. He has been evaluated by neurologists Dr. Reginia Forts at [facility] neurology but he is unable to tell me for the diagnosis was. He was not told that this may be Parkinson's and was not tried on dopaminergic medications. He did have an MRI scan and some lab work but I do not have those results for my review available today. Patient is here today for a second opinion as he feels his tremor is not getting better. He does admit to some light masonry hallucinations as well as restless sleep thrashing of his legs. He denies significant memory or cognitive difficulties. He has not noticed any particular effect of alcohol on his tremor. Is no family history of tremors. Patient does have  chronic hepatitis C and is planning to start treatment with dapsone. He has had no seizures, significant head injury with major loss of consciousness, stroke, TIAs or significant neurological problems.  Update 08/14/2014 : He returns for follow-up after last visit 3 months ago. He feels his tremors are about the same. He had tried reducing the dose of Sinemet but noticed that his tremors got worse and hence he went back up to the original dose which is 25/100 one tablet 3 times daily. He still feels occasionally confused and at times secondary to some cells but he admits that he worries a lot. He also admits to feeling depressed, getting tired easily and not having initiated. He has not been on any medications for depression. Patient denies significant drooling of saliva, bradykinesia, gait or balance problems. He feels his tremor is mild and does not interfere with his activities of daily living. I discussed alternative treatment options including addition of dopamine agonist, anticholinergic or even consideration for deep brain stimulation since his tremor is predominantly unilateral however the patient does not want to consider more aggressive treatment options at the present time. UPDATE 12/26/14 [name], 67 year old black male returns for follow-up. He was last seen by Dr. [name] 08/14/2014. He feels his tremors are better since increasing his carbidopa levodopa to 1-1/2 tablets 3 times daily. He is tolerating it well without dizziness, sleepiness, nightmares or hallucinations A MRI Brain with and without Contrast completed on 07/29/2011 was normal. The Mini-Mental status exam today is unchanged 29 out  of 30. He did not follow-up with his primary care for  complaints of depression. He denies any significant drooling bradykinesia falls or balance problems. His tremor does not interfere with his activities of daily living. He returns for reevaluation Update 07/03/2015 : He returns for follow-up after last  visit 6 months ago. He states his tremors continue to do well and he seems to have responded quite well to increased dose of Sinemet 25/100 1-1/2 tablet 3 times daily. Is tolerating it well without any dizziness, sleepiness, nightmares, hallucinations, nausea or diarrhea. He continues to have mild memory difficulties but his has not been on any treatment for depression and in fact has not seen his primary care physician for the same for quite some time. Update 08/11/2016 PS : He returns for follow-up after last visit year ago. He states his tremors as well as memory loss or more or less unchanged. Continues to have intermittent tremors in the left hand mainly. The tremors fluctuate he has good days and bad days. He is currently taking Sinemet 25/100 one and half tablet 3 times daily. He complains of mild fatigue and daytime sleepiness. He denies hallucinations, delusions, dizziness or upset stomach. He does not want to try higher dose because of fear of side effects. Continues to have short-term memory difficulties. He has not been quite compulsive about taking notes and using memory compensation strategies but plans to do so. He has no new complaints today. UPDATE 07/19/2018CM [name], 67 year old male returns for follow-up with tremors and mild cognitive impairment which he says is stable. He continues to have a resting tremor in the left hand which is intermittent. He continues to have good and bad days. He remains on Sinemet 25 100 (1 and half tablets) 3 times daily. He does little exercise he is not doing any memory compensation strategies at this time and was encouraged to do so. He denies any falls. He denies any hallucinations or increased confusion. He denies any freezing spells. He does complain of left hip pain He returns for reevaluation UPDATE 1/22/2019CM [name], 19 he denies 89-year-old male returns for follow-up with a history of Parkinson's disease.  He continues to have mild resting tremor in  the left hand which is intermittent.  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  He remains on Sinemet 3 times a day however he is taking his medication with a meal.  He complains of sometimes being lightheaded when he stands up too quickly.  He was encouraged to sit on the side of the bed and then get up slightly.  He was also encouraged to stay well-hydrated.  He has not been exercising.  He denies any hallucinations or increased confusion.  He has not had any freezing spells.  He returns for reevaluation REVIEW OF SYSTEMS: Full 14 system review of systems performed and notable only for those listed, all others are neg:  Constitutional: neg  Cardiovascular: neg Ear/Nose/Throat: neg  Skin: neg Eyes: neg Respiratory: neg Gastroitestinal: Urinary frequency Hematology/Lymphatic: neg  Endocrine: neg Musculoskeletal: neg Allergy/Immunology: neg Neurological: Mild cognitive impairment Psychiatric: neg Sleep : neg   ALLERGIES: No Known Allergies  HOME MEDICATIONS: Outpatient Medications Prior to Visit  Medication Sig Dispense Refill  . alfuzosin (UROXATRAL) 10 MG 24 hr tablet Take 10 mg by mouth at bedtime.     [name] aspirin EC 81 MG tablet Take 81 mg by mouth daily.    . carbidopa-levodopa (SINEMET IR) 25-100 MG tablet TAKE 1 AND 1/2 TABLET  BY MOUTH 3 TIMES DAILY. 405 tablet 1  . diclofenac sodium (VOLTAREN) 1 % GEL Apply 2 g topically 4 (four) times daily. 100 g 0  . irbesartan (AVAPRO) 150 MG tablet Take 1 tablet (150 mg total) by mouth daily. 90 tablet 2  . Multiple Vitamin (MULTIVITAMIN WITH MINERALS) TABS tablet Take 1 tablet by mouth daily.    . nitroGLYCERIN (NITROSTAT) 0.4 MG SL tablet PLACE 1 TABLET UNDER THE TONGUE EVERY 5 MINUTES AS NEEDED FOR CHEST PAIN. 25 tablet 6  . Omega-3 Fatty Acids (FISH OIL) 1000 MG CAPS Take by mouth.    . pantoprazole (PROTONIX) 40 MG tablet Take 40 mg by mouth as needed.   0  . Pitavastatin Calcium (LIVALO) 1 MG TABS Take 1 tablet (1 mg total) by  mouth 2 (two) times a week. Take 1 tablet by mouth two times a week. 24 tablet 2  . timolol (TIMOPTIC) 0.5 % ophthalmic solution Place 1 drop into both eyes daily.    . verapamil (CALAN-SR) 240 MG CR tablet Take 1 tablet (240 mg total) by mouth daily. 90 tablet 1  . diclofenac (VOLTAREN) 75 MG EC tablet Take 1 tablet (75 mg total) by mouth 2 (two) times daily. (Patient not taking: Reported on 08/17/2017) 30 tablet 2  . meloxicam (MOBIC) 15 MG tablet TAKE 1 TABLET BY MOUTH DAILY AS NEEDED FOR PAIN (Patient not taking: Reported on 08/17/2017) 30 tablet 0  . traMADol (ULTRAM) 50 MG tablet Take 1 tablet (50 mg total) by mouth every 6 (six) hours as needed. (Patient not taking: Reported on 08/17/2017) 30 tablet 0   No facility-administered medications prior to visit.     PAST MEDICAL HISTORY: Past Medical History:  Diagnosis Date  . Allergy   . Complication of anesthesia    Pt. stated he had a reaction that ended in him requiring urinary cath placement  . Diverticulosis   . Glaucoma   . Gout   . Head injury, closed, with concussion   . Hepatitis C    chronic  . HLD (hyperlipidemia)    statin intolerant (Crestor & Simvastatin) - Taking Livalo 1mg  / week  . Hypertension   . Plantar fasciitis    right  . PVD (peripheral vascular disease) ([place])    With no claudication; only mild abdominal aortic atherosclerosis noted on ultrasound.  [name] Ulcer     PAST SURGICAL HISTORY: Past Surgical History:  Procedure Laterality Date  . CARDIAC CATHETERIZATION  2005   30% Cx. Dr. Melvern Banker  . cataract surgery Left 11/05/2014  . COLONOSCOPY    . ESOPHAGOGASTRODUODENOSCOPY N/A 05/02/2015   Procedure: ESOPHAGOGASTRODUODENOSCOPY (EGD);  Surgeon: Ronald Lobo, MD;  Location: [facility];  Service: Endoscopy;  Laterality: N/A;  . ESOPHAGOGASTRODUODENOSCOPY  05/02/2015   no source of pt chest pain endoscopically evident. small hiatal hernia.  [name] EYE SURGERY    . MEMBRANE PEEL Left 03/14/2014   Procedure:  MEMBRANE PEEL; ENDOLASER;  Surgeon: Hurman Horn, MD;  Location: [facility];  Service: Ophthalmology;  Laterality: Left;  . NM MYOVIEW LTD  2011   Neg Ischemia or infarct.  [name] NM MYOVIEW LTD  10/2015   LOW RISK. Small, fixed basal lateral defect - likely diaphragmatic attenuation. EF 69%  . PARS PLANA VITRECTOMY Left 03/14/2014   Procedure: PARS PLANA VITRECTOMY WITH 25 GAUGE;  Surgeon: Hurman Horn, MD;  Location: [facility];  Service: Ophthalmology;  Laterality: Left;  . TONSILLECTOMY    . TRANSTHORACIC ECHOCARDIOGRAM  2013  Normal EF. No significant Valve Disease  . UPPER GASTROINTESTINAL ENDOSCOPY      FAMILY HISTORY: Family History  Problem Relation Age of Onset  . Heart disease Mother   . Cancer Paternal Grandfather   . Cancer Sister     SOCIAL HISTORY: Social History   Socioeconomic History  . Marital status: Married    Spouse name: barbra gen  . Number of children: 3  . Years of education: AS  . Highest education level: Not on file  Social Needs  . Financial resource strain: Not on file  . Food insecurity - worry: Not on file  . Food insecurity - inability: Not on file  . Transportation needs - medical: Not on file  . Transportation needs - non-medical: Not on file  Occupational History  . 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 Smoking status: Former Smoker    Types: Cigars    Last attempt to quit: 07/27/1988    Years since quitting: 29.0  . Smokeless tobacco: Never Used  Substance and Sexual Activity  . Alcohol use: Yes    Alcohol/week: 1.8 oz    Types: 1 Glasses of wine, 1 Cans of beer, 1 Shots of liquor per week    Comment: occasional  . Drug use: No  . Sexual activity: Yes  Other Topics Concern  . 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     Wife is retired Therapist, sports from [employer].   He walks roughly 2-3 miles in bed time at least 2-3 times a week. He quit smoking  30 years ago.     PHYSICAL EXAM  Vitals:   08/17/17 0839  BP: 110/72  Pulse: 87  Weight: 177 lb 12.8 oz (80.6 kg)   Body mass index is 25.51 kg/m.  Generalized: Well developed, in no acute distress Negative myersons  sign Head: normocephalic and atraumatic,. Oropharynx benign  Neck: Supple, no carotid bruits  Cardiac: Regular rate rhythm, no murmur  Musculoskeletal: No deformity   Neurological examination   Mentation: Alert oriented to time, place, history taking. Attention span and concentration appropriate.   Follows all commands speech and language fluent.   Cranial nerve II-XII: Pupils were equal round reactive to light extraocular movements were full, visual field were full on confrontational test. Facial sensation and strength were normal. hearing was intact to finger rubbing bilaterally. Uvula tongue midline. head turning and shoulder shrug were normal and symmetric.Tongue protrusion into cheek strength was normal. Motor: normal bulk and tone, full strength in the BUE, BLE, fine finger movements normal, no bradykinesia, intermittent left hand resting tremor, cogwheeling at the left wrist and elbow  Sensory: normal and symmetric to light touch, on the face arms and legs  Coordination: finger-nose-finger, heel-to-shin bilaterally, no dysmetria Reflexes: 1+ upper lower and symmetric plantar responses were flexor bilaterally. Gait and Station: Rising up from seated position without assistance, ambulated 210 feet in the hall  moderate stride, decreased  arm swing bilaterally, smooth turning,  DIAGNOSTIC DATA (LABS, IMAGING, TESTING) - I reviewed patient records, labs, notes, testing and imaging myself where available.  Lab Results  Component Value Date   WBC 4.1 09/28/2016   HGB 14.6 09/28/2016   HCT 41.6 09/28/2016   MCV 88 09/28/2016   PLT 263 09/28/2016      Component Value Date/Time   NA 139 11/09/2016 0936   K 4.3 11/09/2016 0936  CL 101 11/09/2016 0936   CO2 28  11/09/2016 0936   GLUCOSE 91 11/09/2016 0936   GLUCOSE 89 06/29/2014 0920   BUN 14 11/09/2016 0936   CREATININE 1.10 11/09/2016 0936   CREATININE 1.06 06/29/2014 0920   CALCIUM 9.0 11/09/2016 0936   PROT 7.4 09/28/2016 0920   ALBUMIN 4.5 09/28/2016 0920   AST 15 09/28/2016 0920   ALT 15 09/28/2016 0920   ALKPHOS 74 09/28/2016 0920   BILITOT 0.6 09/28/2016 0920   GFRNONAA 70 11/09/2016 0936   GFRAA 80 11/09/2016 0936   Lab Results  Component Value Date   CHOL 153 09/28/2016   HDL 52 09/28/2016   LDLCALC 94 09/28/2016   TRIG 36 09/28/2016   CHOLHDL 2.9 09/28/2016     Lab Results  Component Value Date   TSH 1.950 09/28/2016    ASSESSMENT AND PLAN 67 y.o. year old male  has a medical history of 3 year history of resting left arm and leg tremors with mild features of early left sided Parkinson's disease. Good response to Sinemet. He also complains of mild cognitive impairment which appears stable.The patient is a current patient of Dr. [name] who is out of the office today . This note is sent to the work in doctor.     Continue carbidopa/levodopa at current dose  At 8am 1 pm and 6pm will refill Get into a routine exercise program Do mentally challenging activities like crossword puzzles and playing bridge or solitare Follow-up in 6 months  I spent 25 minutes in total face to face time with the patient more than 50% of which was spent counseling and coordination of care, reviewing test results reviewing medications and discussing and reviewing the diagnosis of Parkinson's disease and mild cognitive impairment and strategies for both, he was also given some written information on Parkinson's disease and this was reviewed with him. Dennie Bible, [facility], [facility], APRN  [facility] [address], [place], [place] [address] [phone]

## 2017-08-17 ENCOUNTER — Ambulatory Visit: Payer: 59 | Admitting: Nurse Practitioner

## 2017-08-17 ENCOUNTER — Encounter: Payer: Self-pay | Admitting: Nurse Practitioner

## 2017-08-17 VITALS — BP 110/72 | HR 87 | Wt 177.8 lb

## 2017-08-17 DIAGNOSIS — G3184 Mild cognitive impairment, so stated: Secondary | ICD-10-CM | POA: Diagnosis not present

## 2017-08-17 DIAGNOSIS — G20 Parkinson's disease: Secondary | ICD-10-CM | POA: Diagnosis not present

## 2017-08-17 MED ORDER — CARBIDOPA-LEVODOPA 25-100 MG PO TABS: ORAL_TABLET | ORAL | 1 refills | Status: DC

## 2017-08-17 NOTE — Patient Instructions (Addendum)
Continue carbidopa/levodopa at current dose  At 8am 1 pm and 6pm will refill Get into a routine exercise program Do mentally challenging activities like crossword puzzles and playing bridge or solitare Follow-up in 6 months   Parkinson Disease Parkinson disease is a long-term (chronic) condition that gets worse over time (is progressive). Parkinson disease limits your ability to control your movements and move your body normally. This condition is a type of movement disorder. Each person with Parkinson disease is affected differently. The condition can range from mild to severe. Parkinson disease tends to progress slowly over several years. What are the causes? Parkinson disease results from a loss of brain cells (neurons) in a specific part of the brain (substantia nigra). Some of the neurons in the substantia nigra make an important brain chemical (dopamine). Dopamine is needed to control movement. As the condition gets worse, neurons make less dopamine. This makes it hard to move or control your movements. The exact cause of why neurons are lost or produce less dopamine is not known. Genetic and environmental factors may contribute to the cause of Parkinson disease. What increases the risk? This condition is more likely to develop in:  Men.  People who are 29 years of age or older.  People who have a family history of Parkinson disease.  What are the signs or symptoms? Symptoms of this condition can vary from person to person. The main (primary) symptoms are related to movement (motor symptoms). These include:  Uncontrolled shaking movements (tremor). Tremors usually start in a hand or foot when you are resting (resting tremor). The tremor may stop when you move around.  Slowing of movement. You may lose facial expression and have trouble making the small movements that are needed to button clothing or brush your teeth. You may walk with short, shuffling steps.  Stiff movement  (rigidity). This mostly affects your arms, legs, neck, and upper body. You may walk without swinging your arms. Rigidity can be painful.  Loss of balance and stability when standing. You may sway, fall backward, and have trouble making turns.  Secondary motor symptoms of this condition include:  Shrinking handwriting.  Stooped posture.  Slowed speech.  Trouble swallowing.  Drooling.  Sexual dysfunction.  Muscle cramps.  Loss of smell.  Additional symptoms that are not related to movement include:  Constipation.  Mood swings.  Depression or anxiety.  Sleep disturbances.  Confusion.  Loss of mental abilities (dementia).  Low blood pressure.  Trouble concentrating.  How is this diagnosed? Parkinson disease can be hard to diagnose in its early stages. A diagnosis may be made based on symptoms, a medical history, and physical exam. During your exam, your health care provider will look for:  Lack of facial expression.  Resting tremor.  Stiffness in your neck, arms, and legs.  Abnormal walk.  Trouble with balance.  You may have brain imaging tests done to check for a loss of dopamine-producing areas of the brain. Your healthcare provider may also grade the severity of your condition as mild, moderate, or advanced. Parkinson disease progression is different for everyone. You may not progress to the advanced stage. Mild Parkinson disease involves:  Movement problems that do not affect daily activities.  Movement problems on one side of the body.  Movement problems that are controlled with medicines.  Good response to exercise.  Moderate Parkinson disease involves:  Movement problems on both sides of the body.  Slowing of movement.  Coordination and balance problems.  Less of  a response to medicine.  More side effects from medicines.  Advanced Parkinson disease involves:  Extreme difficulty walking.  Inability to live alone safely.  Signs of  dementia.  Difficulty controlling symptoms with medicine.  How is this treated? There is no cure for Parkinson disease. Treatment focuses on relieving your symptoms. Treatment may include:  Medicines.  Speech, occupational, and physical therapy.  Surgery.  Everyone responds to medicines differently. Your response may change over time. Work with your health care provider to find the best medicines for you. These may include:  Dopamine replacement drugs. These are the most effective medicines. A long-term side effect of these medicines is uncontrolled movements (dyskinesias).  Dopamine agonists. These drugs act like dopamine to stimulate dopamine receptors in the brain. Side effects include nausea and sleepiness, but they cause less dyskinesia.  Other medicines to reduce tremor, prevent dopamine breakdown, reduce dyskinesia, and reduce dementia that is related to Parkinson disease.  Another treatment is deep brain stimulation surgery to reduce tremors and dyskinesia. This procedure involves placing electrodes in the brain. The electrodes are attached to an electric pulse generator that acts like a pacemaker for your brain. This may be an option if you have had the condition for at least four years and are not responding well to medicines. Follow these instructions at home:  Take over-the-counter and prescription medicines only as told by your health care provider.  Install grab bars and railings in your home to prevent falls.  Follow instructions from your health care provider about eating or drinking restrictions.  Return to your normal activities as told by your health care provider. Ask your health care provider what activities are safe for you.  Get regular exercise as told by your health care provider or a physical therapist.  Keep all follow-up visits as told by your health care provider. This is important. These include any visits with a speech therapist or occupational  therapist.  Consider joining a support group for people with Parkinson disease. Contact a health care provider if:  Medicines do not help your symptoms.  You are unsteady or have fallen at home.  You need more support to function well at home.  You have trouble swallowing.  You have severe constipation.  You are struggling with side effects from your medicines.  You see or hear things that are not real (hallucinate).  You feel confused, anxious, or depressed. Get help right away if:  You are injured after a fall.  You cannot swallow without choking.  You have chest pain or trouble breathing.  You do not feel safe at home. This information is not intended to replace advice given to you by your health care provider. Make sure you discuss any questions you have with your health care provider. Document Released: 07/10/2000 Document Revised: 12/16/2015 Document Reviewed: 05/03/2015 Elsevier Interactive Patient Education  [employer].

## 2017-08-18 DIAGNOSIS — B182 Chronic viral hepatitis C: Secondary | ICD-10-CM | POA: Diagnosis not present

## 2017-08-18 DIAGNOSIS — K219 Gastro-esophageal reflux disease without esophagitis: Secondary | ICD-10-CM | POA: Diagnosis not present

## 2017-08-18 DIAGNOSIS — R079 Chest pain, unspecified: Secondary | ICD-10-CM | POA: Diagnosis not present

## 2017-08-27 DIAGNOSIS — K7469 Other cirrhosis of liver: Secondary | ICD-10-CM | POA: Diagnosis not present

## 2017-08-27 NOTE — Anesthesia Preprocedure Evaluation (Addendum)
Anesthesia Evaluation  Patient identified by MRN, date of birth, ID band Patient awake    Reviewed: Allergy & Precautions, NPO status , Patient's Chart, lab work & pertinent test results  Airway Mallampati: II  TM Distance: >3 FB Neck ROM: Full    Dental no notable dental hx.    Pulmonary neg pulmonary ROS, former smoker,    Pulmonary exam normal breath sounds clear to auscultation       Cardiovascular hypertension, Pt. on medications + CAD  negative cardio ROS Normal cardiovascular exam Rhythm:Regular Rate:Normal     Neuro/Psych Depression negative neurological ROS  negative psych ROS   GI/Hepatic negative GI ROS, Neg liver ROS, GERD  ,  Endo/Other  negative endocrine ROS  Renal/GU negative Renal ROS  negative genitourinary   Musculoskeletal negative musculoskeletal ROS (+) Arthritis , Osteoarthritis,    Abdominal   Peds negative pediatric ROS (+)  Hematology negative hematology ROS (+)   Anesthesia Other Findings Parkinson's  Reproductive/Obstetrics negative OB ROS                                                             Anesthesia Evaluation  Patient identified by MRN, date of birth, ID band Patient awake    Reviewed: Allergy & Precautions, H&P , NPO status , Patient's Chart, lab work & pertinent test results  History of Anesthesia Complications Negative for: history of anesthetic complications  Airway Mallampati: I TM Distance: >3 FB Neck ROM: Full    Dental  (+) Partial Lower, Partial Upper, Dental Advisory Given   Pulmonary former smoker,  breath sounds clear to auscultation  Pulmonary exam normal       Cardiovascular Exercise Tolerance: Good hypertension, Pt. on medications + Peripheral Vascular Disease Rhythm:Regular Rate:Normal     Neuro/Psych    GI/Hepatic (+) Hepatitis -, C  Endo/Other    Renal/GU      Musculoskeletal   Abdominal    Peds  Hematology   Anesthesia Other Findings   Reproductive/Obstetrics                        Anesthesia Physical Anesthesia Plan  ASA: III  Anesthesia Plan: General   Post-op Pain Management:    Induction: Intravenous  Airway Management Planned: Oral ETT  Additional Equipment:   Intra-op Plan:   Post-operative Plan: Extubation in OR  Informed Consent: I have reviewed the patients History and Physical, chart, labs and discussed the procedure including the risks, benefits and alternatives for the proposed anesthesia with the patient or authorized representative who has indicated his/her understanding and acceptance.   Dental advisory given  Plan Discussed with: CRNA, Anesthesiologist and Surgeon  Anesthesia Plan Comments:        Anesthesia Quick Evaluation  Anesthesia Physical Anesthesia Plan  ASA: III  Anesthesia Plan: MAC and Regional   Post-op Pain Management:    Induction: Intravenous  PONV Risk Score and Plan: 1 and Ondansetron  Airway Management Planned: Simple Face Mask  Additional Equipment:   Intra-op Plan:   Post-operative Plan:   Informed Consent: I have reviewed the patients History and Physical, chart, labs and discussed the procedure including the risks, benefits and alternatives for the proposed anesthesia with the patient or authorized representative who has indicated his/her understanding and acceptance.  Dental advisory given  Plan Discussed with: CRNA  Anesthesia Plan Comments: (Patient concerned about post-operative urinary retention.)       Anesthesia Quick Evaluation

## 2017-08-31 ENCOUNTER — Encounter (HOSPITAL_COMMUNITY): Payer: Self-pay | Admitting: *Deleted

## 2017-08-31 ENCOUNTER — Other Ambulatory Visit: Payer: Self-pay

## 2017-08-31 NOTE — Progress Notes (Signed)
Anesthesia Chart Review: SAME DAY WORK-UP.  Patient is a 67 year old male scheduled for right great toe Chevron and Weil osteotomy 2nd metatarsal on 09/01/17 by Dr. Meridee Score.  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   Anesthesia concerns: He developed post-operative urinary retention following 2015 vitrectomy. He had been discharged home, but presented to the ED later that day with urinary retention requiring foley catheter placement and out-patient follow-up with urologist Dr. Janice Norrie.  - PCP is Dr. Jill Alexanders. - Cardiologist is Dr. Glenetta Hew. Last visit 09/25/16. One year follow-up recommended. - Neurologist is Dr. Antony Contras with [facility]. Last visit 08/17/17 with Evlyn Courier, NP.  Meds currently listed include alfuzosin, Sinemet IR, irbesartan, Salonpas patch, Nitro, Protonix, Livalo, Timoptic ophthalmic, turmeric, Calan SR.  EKG 09/25/16 ([facility]): Normal sinus rhythm, LAD, anterior septal infarct, age undetermined. LAD is new when compared to 11/14/15 tracing.  Nuclear stress test 11/22/15:  Nuclear stress EF: 69%.  Blood pressure demonstrated a hypertensive response to exercise.  Upsloping ST segment depression ST segment depression of 1 mm was noted during stress in the V5, V6, aVF, III and II leads, and returning to baseline after less than 1 minute of recovery.  Defect 1: There is a small defect of mild severity present in the basal anterolateral location.  The study is normal.  This is a low risk study.  Low risk stress nuclear study with a small fixed basal lateral defect, likely representing artifact. Otherwise normal perfusion and normal left ventricular regional and  global systolic function.  Echo 01/20/12: Summary The left ventricular cavity small.  There is mild asymmetric left ventricular hypertrophy.  The left ventricular systolic function is normal.  EF > 55%. Transmitral septal Doppler flow pattern suggestive of impaired LV relaxation.  The atrial septum is aneurysmal.  There is mild mitral annular calcification.  Trace mitral regurgitation.  Mild tricuspid regurgitation.  Aortic root sclerosis/calcification.  Cardiac cath 01/03/04:  1.  Angiographically patent coronary artery (trivial early lesion in circumflex, not significant at this time). [30% focal area of liminal irregularity in the mid CX] 2.  Normal LV systolic function. EF 60-70%.  3.  Normal renal arteries.  Abdominal U/S with hepatic elastography 09/01/16: IMPRESSION: ULTRASOUND ABDOMEN: Bilateral renal cysts, measuring up to 5.6 cm bilaterally. ULTRASOUND HEPATIC ELASTOGRAPHY: Median hepatic shear wave velocity is calculated at 1.38 m/sec. Corresponding Metavir fibrosis score is  F2 + some F3. Risk of fibrosis is Moderate. Follow-up: Additional testing appropriate  CT chest 10/08/16 (ordered by Dr. Redmond School): IMPRESSION: - Stable small bilateral pulmonary nodules, 5 mm or less. These could be followed with repeat CT in 1 year to ensure stability. - 4.2 cm ascending thoracic aortic aneurysm. Recommend annual imaging followup by CTA or MRA. This recommendation follows 2010 ACCF/AHA/AATS/ACR/ASA/SCA/SCAI/SIR/STS/SVM Guidelines for the Diagnosis and Management of Patients with Thoracic Aortic Disease. Circulation. 2010; 121: I627-O350  Patient is a same day work-up, so he will get labs and evaluation on the day of surgery. Anesthesiologist to review his history of post-anesthesia urinary retention. With prior history, could still be a risk.   [facility]/Anesthesiology Phone [phone] 08/31/2017 1:39 PM

## 2017-08-31 NOTE — Progress Notes (Signed)
Spoke with pt for pre-op call. Pt denies cardiac history, had cath back in 2005 and was told his heart was good. Pt denies chest pain or sob. Pt has "early" Parkinson's Disease. Pt also wanted to make sure we were aware that he had "an allergic reaction to anesthesia in the past". He states he had urinary retention and had to have a catheter. He prefers not to have that again. I told him that we were aware of this.

## 2017-09-01 ENCOUNTER — Ambulatory Visit (HOSPITAL_COMMUNITY): Payer: 59 | Admitting: Vascular Surgery

## 2017-09-01 ENCOUNTER — Telehealth (INDEPENDENT_AMBULATORY_CARE_PROVIDER_SITE_OTHER): Payer: Self-pay | Admitting: Radiology

## 2017-09-01 ENCOUNTER — Encounter (HOSPITAL_COMMUNITY): Payer: Self-pay | Admitting: Certified Registered Nurse Anesthetist

## 2017-09-01 ENCOUNTER — Ambulatory Visit (HOSPITAL_COMMUNITY)
Admission: RE | Admit: 2017-09-01 | Discharge: 2017-09-01 | Disposition: A | Discharge status: Home / Self Care | Payer: 59 | Source: Ambulatory Visit | Attending: Orthopedic Surgery | Admitting: Orthopedic Surgery

## 2017-09-01 ENCOUNTER — Encounter (HOSPITAL_COMMUNITY)
Admission: RE | Disposition: A | Discharge status: Home / Self Care | Payer: Self-pay | Source: Ambulatory Visit | Attending: Orthopedic Surgery

## 2017-09-01 ENCOUNTER — Other Ambulatory Visit (INDEPENDENT_AMBULATORY_CARE_PROVIDER_SITE_OTHER): Payer: Self-pay | Admitting: Family

## 2017-09-01 DIAGNOSIS — G20 Parkinson's disease: Secondary | ICD-10-CM | POA: Diagnosis not present

## 2017-09-01 DIAGNOSIS — M205X1 Other deformities of toe(s) (acquired), right foot: Secondary | ICD-10-CM | POA: Diagnosis not present

## 2017-09-01 DIAGNOSIS — M1990 Unspecified osteoarthritis, unspecified site: Secondary | ICD-10-CM | POA: Diagnosis not present

## 2017-09-01 DIAGNOSIS — K219 Gastro-esophageal reflux disease without esophagitis: Secondary | ICD-10-CM | POA: Diagnosis not present

## 2017-09-01 DIAGNOSIS — Z87891 Personal history of nicotine dependence: Secondary | ICD-10-CM | POA: Insufficient documentation

## 2017-09-01 DIAGNOSIS — M21611 Bunion of right foot: Secondary | ICD-10-CM | POA: Diagnosis not present

## 2017-09-01 DIAGNOSIS — E785 Hyperlipidemia, unspecified: Secondary | ICD-10-CM | POA: Insufficient documentation

## 2017-09-01 DIAGNOSIS — N400 Enlarged prostate without lower urinary tract symptoms: Secondary | ICD-10-CM | POA: Insufficient documentation

## 2017-09-01 DIAGNOSIS — I712 Thoracic aortic aneurysm, without rupture: Secondary | ICD-10-CM | POA: Diagnosis not present

## 2017-09-01 DIAGNOSIS — Z79899 Other long term (current) drug therapy: Secondary | ICD-10-CM | POA: Diagnosis not present

## 2017-09-01 DIAGNOSIS — I2510 Atherosclerotic heart disease of native coronary artery without angina pectoris: Secondary | ICD-10-CM | POA: Diagnosis not present

## 2017-09-01 DIAGNOSIS — I10 Essential (primary) hypertension: Secondary | ICD-10-CM | POA: Diagnosis not present

## 2017-09-01 DIAGNOSIS — M109 Gout, unspecified: Secondary | ICD-10-CM | POA: Diagnosis not present

## 2017-09-01 DIAGNOSIS — I739 Peripheral vascular disease, unspecified: Secondary | ICD-10-CM | POA: Diagnosis not present

## 2017-09-01 DIAGNOSIS — M21531 Acquired clawfoot, right foot: Secondary | ICD-10-CM | POA: Diagnosis not present

## 2017-09-01 DIAGNOSIS — Z8249 Family history of ischemic heart disease and other diseases of the circulatory system: Secondary | ICD-10-CM | POA: Diagnosis not present

## 2017-09-01 DIAGNOSIS — B1920 Unspecified viral hepatitis C without hepatic coma: Secondary | ICD-10-CM | POA: Insufficient documentation

## 2017-09-01 HISTORY — PX: WEIL OSTEOTOMY: SHX5044

## 2017-09-01 HISTORY — DX: Unspecified osteoarthritis, unspecified site: M19.90

## 2017-09-01 HISTORY — DX: Thoracic aortic aneurysm, without rupture: I71.2

## 2017-09-01 HISTORY — DX: Parkinson's disease: G20

## 2017-09-01 HISTORY — DX: Gastro-esophageal reflux disease without esophagitis: K21.9

## 2017-09-01 HISTORY — DX: Benign prostatic hyperplasia without lower urinary tract symptoms: N40.0

## 2017-09-01 HISTORY — DX: Aneurysm of the ascending aorta, without rupture: I71.21

## 2017-09-01 HISTORY — DX: Parkinson's disease without dyskinesia, without mention of fluctuations: G20.A1

## 2017-09-01 LAB — COMPREHENSIVE METABOLIC PANEL
ALT: 5 U/L — AB (ref 17–63)
AST: 22 U/L (ref 15–41)
Albumin: 3.9 g/dL (ref 3.5–5.0)
Alkaline Phosphatase: 63 U/L (ref 38–126)
Anion gap: 11 (ref 5–15)
BILIRUBIN TOTAL: 0.7 mg/dL (ref 0.3–1.2)
BUN: 17 mg/dL (ref 6–20)
CO2: 20 mmol/L — ABNORMAL LOW (ref 22–32)
CREATININE: 1.1 mg/dL (ref 0.61–1.24)
Calcium: 8.8 mg/dL — ABNORMAL LOW (ref 8.9–10.3)
Chloride: 105 mmol/L (ref 101–111)
GFR calc Af Amer: >60 mL/min (ref >60)
Glucose, Bld: 91 mg/dL (ref 65–99)
Potassium: 3.9 mmol/L (ref 3.5–5.1)
Sodium: 136 mmol/L (ref 135–145)
TOTAL PROTEIN: 6.7 g/dL (ref 6.5–8.1)

## 2017-09-01 LAB — CBC
HCT: 41.7 % (ref 39.0–52.0)
Hemoglobin: 13.8 g/dL (ref 13.0–17.0)
MCH: 30.6 pg (ref 26.0–34.0)
MCHC: 33.1 g/dL (ref 30.0–36.0)
MCV: 92.5 fL (ref 78.0–100.0)
PLATELETS: 249 10*3/uL (ref 150–400)
RBC: 4.51 MIL/uL (ref 4.22–5.81)
RDW: 12.6 % (ref 11.5–15.5)
WBC: 4.3 10*3/uL (ref 4.0–10.5)

## 2017-09-01 SURGERY — OSTEOTOMY, WEIL
Anesthesia: Monitor Anesthesia Care | Site: Foot | Laterality: Right

## 2017-09-01 MED ORDER — OXYCODONE HCL 5 MG/5ML PO SOLN: 5.0000 mg | Freq: Once | ORAL | Status: DC | PRN

## 2017-09-01 MED ORDER — OXYCODONE HCL 5 MG PO TABS: 5.0000 mg | ORAL_TABLET | Freq: Once | ORAL | Status: DC | PRN

## 2017-09-01 MED ORDER — PROPOFOL 500 MG/50ML IV EMUL
INTRAVENOUS | Status: DC | PRN
  Administered 2017-09-01: 70 ug/kg/min via INTRAVENOUS

## 2017-09-01 MED ORDER — PROMETHAZINE HCL 25 MG/ML IJ SOLN: 6.2500 mg | INTRAMUSCULAR | Status: DC | PRN

## 2017-09-01 MED ORDER — CEFAZOLIN SODIUM-DEXTROSE 2-4 GM/100ML-% IV SOLN
2.0000 g | INTRAVENOUS | Status: AC
  Administered 2017-09-01: 2 g via INTRAVENOUS

## 2017-09-01 MED ORDER — HYDROMORPHONE HCL 1 MG/ML IJ SOLN: 0.2500 mg | INTRAMUSCULAR | Status: DC | PRN

## 2017-09-01 MED ORDER — CEFAZOLIN SODIUM-DEXTROSE 2-4 GM/100ML-% IV SOLN
INTRAVENOUS | Status: AC
  Filled 2017-09-01: qty 100

## 2017-09-01 MED ORDER — ALLOPURINOL 100 MG PO TABS: 100.0000 mg | ORAL_TABLET | Freq: Two times a day (BID) | ORAL | 3 refills | Status: DC

## 2017-09-01 MED ORDER — 0.9 % SODIUM CHLORIDE (POUR BTL) OPTIME
TOPICAL | Status: DC | PRN
Start: 2017-09-01 — End: 2017-09-01
  Administered 2017-09-01: 1000 mL

## 2017-09-01 MED ORDER — FENTANYL CITRATE (PF) 100 MCG/2ML IJ SOLN
INTRAMUSCULAR | Status: DC | PRN
  Administered 2017-09-01: 50 ug via INTRAVENOUS

## 2017-09-01 MED ORDER — HYDROCODONE-ACETAMINOPHEN 5-325 MG PO TABS: 1.0000 | ORAL_TABLET | ORAL | 0 refills | Status: DC | PRN

## 2017-09-01 MED ORDER — MIDAZOLAM HCL 2 MG/2ML IJ SOLN
INTRAMUSCULAR | Status: AC
  Filled 2017-09-01: qty 2

## 2017-09-01 MED ORDER — ONDANSETRON HCL 4 MG/2ML IJ SOLN
INTRAMUSCULAR | Status: DC | PRN
  Administered 2017-09-01: 4 mg via INTRAVENOUS

## 2017-09-01 MED ORDER — LACTATED RINGERS IV SOLN
INTRAVENOUS | Status: DC | PRN
  Administered 2017-09-01: 08:00:00 via INTRAVENOUS

## 2017-09-01 MED ORDER — PROPOFOL 10 MG/ML IV BOLUS
INTRAVENOUS | Status: AC
  Filled 2017-09-01: qty 20

## 2017-09-01 MED ORDER — EPHEDRINE SULFATE 50 MG/ML IJ SOLN
INTRAMUSCULAR | Status: DC | PRN
  Administered 2017-09-01: 15 mg via INTRAVENOUS
  Administered 2017-09-01 (×2): 10 mg via INTRAVENOUS

## 2017-09-01 MED ORDER — MIDAZOLAM HCL 5 MG/5ML IJ SOLN
INTRAMUSCULAR | Status: DC | PRN
  Administered 2017-09-01: 1 mg via INTRAVENOUS

## 2017-09-01 MED ORDER — FENTANYL CITRATE (PF) 250 MCG/5ML IJ SOLN
INTRAMUSCULAR | Status: AC
  Filled 2017-09-01: qty 5

## 2017-09-01 MED ORDER — INDOMETHACIN 50 MG PO CAPS: 50.0000 mg | ORAL_CAPSULE | Freq: Three times a day (TID) | ORAL | 2 refills | Status: DC | PRN

## 2017-09-01 MED ORDER — ROPIVACAINE HCL 5 MG/ML IJ SOLN
INTRAMUSCULAR | Status: DC | PRN
  Administered 2017-09-01: 30 mL via PERINEURAL

## 2017-09-01 MED ORDER — CHLORHEXIDINE GLUCONATE 4 % EX LIQD: 60.0000 mL | Freq: Once | CUTANEOUS | Status: DC

## 2017-09-01 MED ORDER — COLCHICINE 0.6 MG PO TABS
0.6000 mg | ORAL_TABLET | Freq: Every day | ORAL | 3 refills | Status: DC
Start: 2017-09-01 — End: 2017-09-27

## 2017-09-01 MED ORDER — PROPOFOL 10 MG/ML IV BOLUS
INTRAVENOUS | Status: DC | PRN
  Administered 2017-09-01: 15 mg via INTRAVENOUS

## 2017-09-01 MED FILL — HYDROCODON-APAP 5-325: 5-325 | 4 days supply | Qty: 40 | Fill #0

## 2017-09-01 MED FILL — ALLOPURINOL 100 MG TABS: 100 | 30 days supply | Qty: 60 | Fill #0 | Status: TO

## 2017-09-01 SURGICAL SUPPLY — 47 items
BLADE AVERAGE 25X9 (BLADE) ×4 IMPLANT
BLADE MINI RND TIP GREEN BEAV (BLADE) IMPLANT
BNDG COHESIVE 1X5 TAN STRL LF (GAUZE/BANDAGES/DRESSINGS) IMPLANT
BNDG COHESIVE 6X5 TAN STRL LF (GAUZE/BANDAGES/DRESSINGS) ×2 IMPLANT
BNDG ESMARK 4X9 LF (GAUZE/BANDAGES/DRESSINGS) ×2 IMPLANT
BNDG GAUZE ELAST 4 BULKY (GAUZE/BANDAGES/DRESSINGS) ×2 IMPLANT
BNDG GAUZE STRTCH 6 (GAUZE/BANDAGES/DRESSINGS) IMPLANT
CORDS BIPOLAR (ELECTRODE) IMPLANT
COTTON STERILE ROLL (GAUZE/BANDAGES/DRESSINGS) IMPLANT
COVER SURGICAL LIGHT HANDLE (MISCELLANEOUS) ×4 IMPLANT
CUFF TOURNIQUET SINGLE 18IN (TOURNIQUET CUFF) IMPLANT
CUFF TOURNIQUET SINGLE 24IN (TOURNIQUET CUFF) IMPLANT
DRAPE OEC MINIVIEW 54X84 (DRAPES) IMPLANT
DRAPE U-SHAPE 47X51 STRL (DRAPES) ×2 IMPLANT
DRSG ADAPTIC 3X8 NADH LF (GAUZE/BANDAGES/DRESSINGS) ×2 IMPLANT
DURAPREP 26ML APPLICATOR (WOUND CARE) ×2 IMPLANT
ELECT REM PT RETURN 9FT ADLT (ELECTROSURGICAL) ×2
ELECTRODE REM PT RTRN 9FT ADLT (ELECTROSURGICAL) ×1 IMPLANT
GAUZE SPONGE 4X4 12PLY STRL (GAUZE/BANDAGES/DRESSINGS) ×2 IMPLANT
GLOVE BIOGEL PI IND STRL 9 (GLOVE) ×1 IMPLANT
GLOVE BIOGEL PI INDICATOR 9 (GLOVE) ×1
GLOVE SURG ORTHO 9.0 STRL STRW (GLOVE) ×2 IMPLANT
GOWN STRL REUS W/ TWL LRG LVL3 (GOWN DISPOSABLE) ×1 IMPLANT
GOWN STRL REUS W/ TWL XL LVL3 (GOWN DISPOSABLE) ×1 IMPLANT
GOWN STRL REUS W/TWL 2XL LVL3 (GOWN DISPOSABLE) ×2 IMPLANT
GOWN STRL REUS W/TWL LRG LVL3 (GOWN DISPOSABLE) ×1
GOWN STRL REUS W/TWL XL LVL3 (GOWN DISPOSABLE) ×1
KIT BASIN OR (CUSTOM PROCEDURE TRAY) ×2 IMPLANT
KIT ROOM TURNOVER OR (KITS) ×2 IMPLANT
MANIFOLD NEPTUNE II (INSTRUMENTS) IMPLANT
NEEDLE HYPO 25GX1X1/2 BEV (NEEDLE) IMPLANT
NS IRRIG 1000ML POUR BTL (IV SOLUTION) ×2 IMPLANT
PACK ORTHO EXTREMITY (CUSTOM PROCEDURE TRAY) ×2 IMPLANT
PAD ARMBOARD 7.5X6 YLW CONV (MISCELLANEOUS) ×2 IMPLANT
PAD CAST 4YDX4 CTTN HI CHSV (CAST SUPPLIES) IMPLANT
PADDING CAST COTTON 4X4 STRL (CAST SUPPLIES)
SCREW CORTEX ST 2.0X12 (Screw) ×2 IMPLANT
SPECIMEN JAR SMALL (MISCELLANEOUS) IMPLANT
SUCTION FRAZIER HANDLE 10FR (MISCELLANEOUS) ×1
SUCTION TUBE FRAZIER 10FR DISP (MISCELLANEOUS) ×1 IMPLANT
SUT ETHILON 2 0 FS 18 (SUTURE) ×2 IMPLANT
SUT VIC AB 2-0 FS1 27 (SUTURE) ×2 IMPLANT
SYR CONTROL 10ML LL (SYRINGE) IMPLANT
TOWEL OR 17X24 6PK STRL BLUE (TOWEL DISPOSABLE) ×2 IMPLANT
TOWEL OR 17X26 10 PK STRL BLUE (TOWEL DISPOSABLE) IMPLANT
TUBE CONNECTING 12X1/4 (SUCTIONS) ×2 IMPLANT
WATER STERILE IRR 1000ML POUR (IV SOLUTION) IMPLANT

## 2017-09-01 NOTE — Anesthesia Procedure Notes (Signed)
Anesthesia Regional Block: Popliteal block   Pre-Anesthetic Checklist: ,, timeout performed, Correct Patient, Correct Site, Correct Laterality, Correct Procedure, Correct Position, site marked, Risks and benefits discussed,  Surgical consent,  Pre-op evaluation,  At surgeon's request and post-op pain management  Laterality: Right  Prep: chloraprep       Needles:  Injection technique: Single-shot  Needle Type: Stimiplex     Needle Length: 9cm  Needle Gauge: 21     Additional Needles:   Procedures:,,,, ultrasound used (permanent image in chart),,,,  Narrative:  Start time: 09/01/2017 8:02 AM End time: 09/01/2017 8:07 AM Injection made incrementally with aspirations every 5 mL.  Performed by: Personally  Anesthesiologist: Lynda Rainwater, MD

## 2017-09-01 NOTE — Op Note (Signed)
09/01/2017  8:53 AM  PATIENT:  [name]    PRE-OPERATIVE DIAGNOSIS:  Bunion, Claw Toe Right Foot  POST-OPERATIVE DIAGNOSIS:  Same  PROCEDURE:  RIGHT GREAT TOE CHEVRON AND WEIL OSTEOTOMY 2ND METATARSAL  SURGEON:  Newt Minion, MD  PHYSICIAN ASSISTANT:None ANESTHESIA:   General  PREOPERATIVE INDICATIONS:  [name] is a  67 y.o. male with a diagnosis of Bunion, Claw Toe Right Foot who failed conservative measures and elected for surgical management.    The risks benefits and alternatives were discussed with the patient preoperatively including but not limited to the risks of infection, bleeding, nerve injury, cardiopulmonary complications, the need for revision surgery, among others, and the patient was willing to proceed.  OPERATIVE IMPLANTS: 2 x 12 mm mini frag screw and 0.0625 K wire  OPERATIVE FINDINGS: Gout involving the great toe and second toe MTP joint with large bony fragments within the second toe MTP joint.  OPERATIVE PROCEDURE: Patient was brought the operating room and underwent a regional anesthetic plus MAC.  The right lower extremity was prepped using DuraPrep draped into a sterile field a timeout was called.  A medial longitudinal incision was made over the great toe MTP joint.  A football shape of the retinaculum was ellipsed from the plantar aspect.  A ostectomy was pursed performed of the great toe first metatarsal a chevron osteotomy was performed the metatarsal head was translated laterally 4 mm this was stabilized with a 0.0625 K wire and a second ostectomy was performed.  The wound was irrigated with normal saline the gouty tissue was debrided.  The retinaculum was closed with 2-0 Vicryl the skin was closed using 2-0 nylon.  Attention was then focused on the second toe MTP joint.  A dorsal incision was made there was a large bony fragment dorsally of the second metatarsal this was resected.  A Weil osteotomy was performed the metatarsal head was translated  proximally overhanging bone was resected and this was stabilized but with a 2 x 12 mm mini frag screw.  The wound was irrigated incision was closed using 2-0 nylon.  A sterile compressive dressing was applied patient was  taken the PACU in stable condition.   DISCHARGE PLANNING:  Antibiotic duration: Perioperative  Weightbearing: Touchdown weightbearing on the right  Pain medication: Prescription for Vicodin  Dressing care/ Wound VAC: Dry dressing  Ambulatory devices: Crutches  Discharge to: Home  Follow-up: In the office 1 week post operative.

## 2017-09-01 NOTE — Anesthesia Procedure Notes (Signed)
Procedure Name: MAC Date/Time: 09/01/2017 8:18 AM Performed by: White, Amedeo Plenty, CRNA Pre-anesthesia Checklist: Patient identified, Emergency Drugs available, Suction available and Patient being monitored Patient Re-evaluated:Patient Re-evaluated prior to induction Oxygen Delivery Method: Simple face mask

## 2017-09-01 NOTE — Progress Notes (Signed)
Orthopedic Tech Progress Note Patient Details:  [name] 03/18/1951 [id number]  Ortho Devices Type of Ortho Device: Crutches Ortho Device/Splint Location: rle Ortho Device/Splint Interventions: Application   Post Interventions Patient Tolerated: Well Instructions Provided: Care of device   [name] 09/01/2017, 9:56 AM Viewed order from doctor's order list

## 2017-09-01 NOTE — H&P (Signed)
[name] is an 67 y.o. male.   Chief Complaint: Painful bunion right great toe and painful clawing of the second toe. HPI: Patient is a 67 year old gentleman who has had chronic pain from a bunion of the great toe and painful clawing of the second toe.    Past Medical History:  Diagnosis Date  . Allergy   . Arthritis   . BPH (benign prostatic hyperplasia)   . Complication of anesthesia    Pt. stated he had a reaction that ended in him requiring urinary cath placement  . Diverticulosis   . GERD (gastroesophageal reflux disease)    esophageal spasms  . Glaucoma   . Gout   . Head injury, closed, with concussion   . Hepatitis C    chronic - Has been treated with Harvoni  . HLD (hyperlipidemia)    statin intolerant (Crestor & Simvastatin) - Taking Livalo 1mg  / week  . Hypertension   . Parkinson's disease ([place])   . Plantar fasciitis    right  . PVD (peripheral vascular disease) ([place])    With no claudication; only mild abdominal aortic atherosclerosis noted on ultrasound.  . Thoracic ascending aortic aneurysm ([facility])    4.2 cm ascending TAA 09/2016 CT, 1 yr f/u rec  . Ulcer     Past Surgical History:  Procedure Laterality Date  . CARDIAC CATHETERIZATION  2005   30% Cx. Dr. Melvern Banker  . cataract surgery Left 11/05/2014  . COLONOSCOPY    . ESOPHAGOGASTRODUODENOSCOPY N/A 05/02/2015   Procedure: ESOPHAGOGASTRODUODENOSCOPY (EGD);  Surgeon: Ronald Lobo, MD;  Location: [facility];  Service: Endoscopy;  Laterality: N/A;  . ESOPHAGOGASTRODUODENOSCOPY  05/02/2015   no source of pt chest pain endoscopically evident. small hiatal hernia.  [name] EYE SURGERY    . MEMBRANE PEEL Left 03/14/2014   Procedure: MEMBRANE PEEL; ENDOLASER;  Surgeon: Hurman Horn, MD;  Location: [facility];  Service: Ophthalmology;  Laterality: Left;  . NM MYOVIEW LTD  2011   Neg Ischemia or infarct.  [name] NM MYOVIEW LTD  10/2015   LOW RISK. Small, fixed basal lateral defect - likely diaphragmatic attenuation. EF 69%  .  PARS PLANA VITRECTOMY Left 03/14/2014   Procedure: PARS PLANA VITRECTOMY WITH 25 GAUGE;  Surgeon: Hurman Horn, MD;  Location: [place];  Service: Ophthalmology;  Laterality: Left;  . TONSILLECTOMY    . TRANSTHORACIC ECHOCARDIOGRAM  2013   Normal EF. No significant Valve Disease  . UPPER GASTROINTESTINAL ENDOSCOPY      Family History  Problem Relation Age of Onset  . Heart disease Mother   . Cancer Paternal Grandfather   . Cancer Sister    Social History:  reports that he quit smoking about 29 years ago. His smoking use included cigars. he has never used smokeless tobacco. He reports that he drinks about 1.8 oz of alcohol per week. He reports that he does not use drugs.  Allergies: No Known Allergies  Medications Prior to Admission  Medication Sig Dispense Refill  . alfuzosin (UROXATRAL) 10 MG 24 hr tablet Take 10 mg by mouth at bedtime.     . carbidopa-levodopa (SINEMET IR) 25-100 MG tablet TAKE 1 AND 1/2 TABLET BY MOUTH 3 TIMES DAILY. 405 tablet 1  . diclofenac sodium (VOLTAREN) 1 % GEL Apply 2 g topically 4 (four) times daily. (Patient taking differently: Apply 2 g topically 2 (two) times daily as needed (pain). ) 100 g 0  . ibuprofen (ADVIL,MOTRIN) 200 MG tablet Take 400 mg by mouth 2 (  two) times daily as needed for headache or moderate pain.    [name] irbesartan (AVAPRO) 150 MG tablet Take 1 tablet (150 mg total) by mouth daily. (Patient taking differently: Take 150 mg by mouth every evening. ) 90 tablet 2  . Liniments (SALONPAS PAIN RELIEF PATCH EX) Apply 1-2 patches topically daily as needed (pain).    . Multiple Vitamin (MULTIVITAMIN WITH MINERALS) TABS tablet Take 1 tablet by mouth 4 (four) times a week.     . nitroGLYCERIN (NITROSTAT) 0.4 MG SL tablet PLACE 1 TABLET UNDER THE TONGUE EVERY 5 MINUTES AS NEEDED FOR CHEST PAIN. 25 tablet 6  . pantoprazole (PROTONIX) 40 MG tablet Take 40 mg by mouth daily as needed (acid reflux).   0  . Pitavastatin Calcium (LIVALO) 1 MG TABS Take 1 tablet  (1 mg total) by mouth 2 (two) times a week. Take 1 tablet by mouth two times a week. (Patient taking differently: Take 1 tablet by mouth 2 (two) times a week. On Monday and Friday) 24 tablet 2  . timolol (TIMOPTIC) 0.5 % ophthalmic solution Place 1 drop into the left eye daily.     . TURMERIC PO Take 1,000 mg by mouth daily.    . verapamil (CALAN-SR) 240 MG CR tablet Take 1 tablet (240 mg total) by mouth daily. (Patient taking differently: Take 240 mg by mouth every evening. ) 90 tablet 1  . vitamin B-12 (CYANOCOBALAMIN) 1000 MCG tablet Take 1,000 mcg by mouth daily as needed (energy).      No results found for this or any previous visit (from the past 48 hour(s)). No results found.  Review of Systems  All other systems reviewed and are negative.   Weight 177 lb 11.1 oz (80.6 kg). Physical Exam  Patient is alert, oriented, no adenopathy, well-dressed, normal affect, normal respiratory effort. Examination patient does have an antalgic gait his shoe was worn laterally from unloading pressure from the first and second metatarsal head.  He has a good dorsalis pedis pulse good ankle good subtalar motion dorsiflexion 20 degrees past neutral with his knee extended.  He has tenderness to palpation of the medial eminence of his bunion he does not have hallux rigidus.  He is point tender to palpation over the second metatarsal head with swelling around the joint consistent with degenerative changes or possible chronic gout.   Assessment/Plan 1. Bunion of great toe of right foot   2. Claw toe, acquired, right     Plan: Recommended proceeding with surgical intervention.  Discussed that would recommend proceeding with a chevron osteotomy for the first metatarsal Weil osteotomy for the second metatarsal.  The degenerative changes of the second metatarsal joint may be due to gout.     Newt Minion, MD 09/01/2017, 6:58 AM

## 2017-09-01 NOTE — Transfer of Care (Signed)
Immediate Anesthesia Transfer of Care Note  Patient: [name]  Procedure(s) Performed: RIGHT GREAT TOE CHEVRON AND WEIL OSTEOTOMY 2ND METATARSAL (Right Foot)  Patient Location: PACU  Anesthesia Type:MAC combined with regional for post-op pain  Level of Consciousness: awake, alert , oriented and patient cooperative  Airway & Oxygen Therapy: Patient Spontanous Breathing  Post-op Assessment: Report given to RN and Post -op Vital signs reviewed and stable  Post vital signs: Reviewed and stable  Last Vitals:  Vitals:   09/01/17 0712  BP: (!) 150/97  Pulse: 61  Resp: 20  Temp: 36.6 C  SpO2: 98%    Last Pain:  Vitals:   09/01/17 0712  TempSrc: Oral      Patients Stated Pain Goal: 5 (35/[id number])  Complications: No apparent anesthesia complications

## 2017-09-01 NOTE — Progress Notes (Signed)
Orthopedic Tech Progress Note Patient Details:  [name] 14-Aug-1950 [id number]  Ortho Devices Type of Ortho Device: Postop shoe/boot Ortho Device/Splint Location: rle Ortho Device/Splint Interventions: Application   Post Interventions Patient Tolerated: Well Instructions Provided: Care of device   [name] 09/01/2017, 9:21 AM Viewed order from doctor's order list

## 2017-09-01 NOTE — Telephone Encounter (Signed)
Delilah Shan from [facility] called and states that patient was prescribed Colchicine. She states he also takes Verapamil and this puts him at increased risk for toxicity and myopathy. Would like return call before they fill to be sure we would still like for it to be prescribed.  Per Dondra Prader, do not fill Colchicine. She has prescribed Indomethacin instead. I called pharmacy and advised.

## 2017-09-01 NOTE — Anesthesia Postprocedure Evaluation (Signed)
Anesthesia Post Note  Patient: VLADIMIR LENHOFF  Procedure(s) Performed: RIGHT GREAT TOE CHEVRON AND WEIL OSTEOTOMY 2ND METATARSAL (Right Foot)     Patient location during evaluation: PACU Anesthesia Type: Regional and General Level of consciousness: awake and alert Pain management: pain level controlled Vital Signs Assessment: post-procedure vital signs reviewed and stable Respiratory status: spontaneous breathing, nonlabored ventilation and respiratory function stable Cardiovascular status: blood pressure returned to baseline and stable Postop Assessment: no apparent nausea or vomiting Anesthetic complications: no    Last Vitals:  Vitals:   09/01/17 0932 09/01/17 0934  BP:  (!) 159/101  Pulse:  (!) 54  Resp:  14  Temp: (!) 36.1 C   SpO2:  100%    Last Pain:  Vitals:   09/01/17 8115  TempSrc: Oral                 Lynda Rainwater

## 2017-09-02 ENCOUNTER — Encounter (HOSPITAL_COMMUNITY): Payer: Self-pay | Admitting: Orthopedic Surgery

## 2017-09-08 ENCOUNTER — Ambulatory Visit (INDEPENDENT_AMBULATORY_CARE_PROVIDER_SITE_OTHER): Payer: 59 | Admitting: Family

## 2017-09-08 ENCOUNTER — Encounter (INDEPENDENT_AMBULATORY_CARE_PROVIDER_SITE_OTHER): Payer: Self-pay | Admitting: Family

## 2017-09-08 ENCOUNTER — Inpatient Hospital Stay (INDEPENDENT_AMBULATORY_CARE_PROVIDER_SITE_OTHER): Payer: 59 | Admitting: Family

## 2017-09-08 VITALS — Ht 70.0 in | Wt 173.0 lb

## 2017-09-08 DIAGNOSIS — M205X1 Other deformities of toe(s) (acquired), right foot: Secondary | ICD-10-CM

## 2017-09-08 DIAGNOSIS — M21611 Bunion of right foot: Secondary | ICD-10-CM

## 2017-09-08 NOTE — Progress Notes (Signed)
Post-Op Visit Note   Patient: [name]           Date of Birth: 1951-06-19           MRN: [id number] Visit Date: 09/08/2017 PCP: Denita Lung, MD  Chief Complaint:  Chief Complaint  Patient presents with  . Right Foot - Routine Post Op    09/01/17 Rt. GT chevron & weil osteotomy 2nd MT    HPI:  HPI The patient is a 67 year old gentleman seen today 1 week s/p right Gt chevron and weil osteotomy 2nd MT. Is nonweight bearing with crutches in a post op shoe.   Ortho Exam Incisions well approximated with sutures.  Healing well. No gaping, drainage or erythema. Pin in place.  Visit Diagnoses:  1. Bunion of great toe of right foot   2. Claw toe, acquired, right     Plan: begin daily dial soap cleansing. Apply dry dressing. Paint pin track with neosporin daily. Remain nonweight bearing. Follow up in office in 1 week for pin removal and suture removal.   Follow-Up Instructions: Return in about 1 week (around 09/15/2017).   Imaging: No results found.  Orders:  No orders of the defined types were placed in this encounter.  No orders of the defined types were placed in this encounter.    PMFS History: Patient Active Problem List   Diagnosis Date Noted  . Bunion of great toe of right foot 07/14/2017  . Claw toe, acquired, right 07/14/2017  . 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  . Abnormal laboratory test 10/09/2016  . Medication management 10/09/2016  . Plantar fasciitis of right foot 02/19/2015  . Depression 08/14/2014  . Family history of heart disease in male family member before age 63 04/26/2014  . Mild cognitive impairment 03/13/2014  . 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 Hyperlipidemia with target LDL less than 100   . Abdominal aortic atherosclerosis ([place])   . Moderate essential hypertension 02/25/2011  . Arthropathy 02/25/2011  . HAV (hallux abducto valgus) 02/25/2011   Past Medical History:  Diagnosis Date    . Allergy   . Arthritis   . BPH (benign prostatic hyperplasia)   . Complication of anesthesia    Pt. stated he had a reaction that ended in him requiring urinary cath placement  . Diverticulosis   . GERD (gastroesophageal reflux disease)    esophageal spasms  . Glaucoma   . Gout   . Head injury, closed, with concussion   . Hepatitis C    chronic - Has been treated with Harvoni  . HLD (hyperlipidemia)    statin intolerant (Crestor & Simvastatin) - Taking Livalo 1mg  / week  . Hypertension   . Parkinson's disease ([place])   . Plantar fasciitis    right  . PVD (peripheral vascular disease) ([place])    With no claudication; only mild abdominal aortic atherosclerosis noted on ultrasound.  . Thoracic ascending aortic aneurysm (HCC)    4.2 cm ascending TAA 09/2016 CT, 1 yr f/u rec  . Ulcer     Family History  Problem Relation Age of Onset  . Heart disease Mother   . Cancer Paternal Grandfather   . Cancer Sister     Past Surgical History:  Procedure Laterality Date  . CARDIAC CATHETERIZATION  2005   30% Cx. Dr. Melvern Banker  . cataract surgery Left 11/05/2014  . COLONOSCOPY    . ESOPHAGOGASTRODUODENOSCOPY N/A 05/02/2015   Procedure: ESOPHAGOGASTRODUODENOSCOPY (EGD);  Surgeon:  Ronald Lobo, MD;  Location: [facility];  Service: Endoscopy;  Laterality: N/A;  . ESOPHAGOGASTRODUODENOSCOPY  05/02/2015   no source of pt chest pain endoscopically evident. small hiatal hernia.  [name] EYE SURGERY    . MEMBRANE PEEL Left 03/14/2014   Procedure: MEMBRANE PEEL; ENDOLASER;  Surgeon: Hurman Horn, MD;  Location: [facility];  Service: Ophthalmology;  Laterality: Left;  . NM MYOVIEW LTD  2011   Neg Ischemia or infarct.  [name] NM MYOVIEW LTD  10/2015   LOW RISK. Small, fixed basal lateral defect - likely diaphragmatic attenuation. EF 69%  . PARS PLANA VITRECTOMY Left 03/14/2014   Procedure: PARS PLANA VITRECTOMY WITH 25 GAUGE;  Surgeon: Hurman Horn, MD;  Location: [facility];  Service: Ophthalmology;  Laterality: Left;   . TONSILLECTOMY    . TRANSTHORACIC ECHOCARDIOGRAM  2013   Normal EF. No significant Valve Disease  . UPPER GASTROINTESTINAL ENDOSCOPY    . WEIL OSTEOTOMY Right 09/01/2017   Procedure: RIGHT GREAT TOE CHEVRON AND WEIL OSTEOTOMY 2ND METATARSAL;  Surgeon: Newt Minion, MD;  Location: [facility];  Service: Orthopedics;  Laterality: Right;   Social History   Occupational History  . 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 Smoking status: Former Smoker    Types: Cigars    Last attempt to quit: 07/27/1988    Years since quitting: 29.1  . Smokeless tobacco: Never Used  Substance and Sexual Activity  . Alcohol use: Yes    Alcohol/week: 1.8 oz    Types: 1 Glasses of wine, 1 Cans of beer, 1 Shots of liquor per week    Comment: occasional  . Drug use: No  . Sexual activity: Yes

## 2017-09-10 ENCOUNTER — Telehealth (INDEPENDENT_AMBULATORY_CARE_PROVIDER_SITE_OTHER): Payer: Self-pay | Admitting: Orthopedic Surgery

## 2017-09-10 NOTE — Telephone Encounter (Signed)
Pt saw Erin on 09/08/17. Pt was instructed to f/u around 2/220/19. Pt stated he was expecting  to come in next week.Due to  our Limited availability I couldn't sched pt at this present time.  Pt next appt is sched 09/21/17 and pt has an Haematologist. Pt stated he wanted to be seen as soon as possible.    Wed 09/01/17 pt surgery

## 2017-09-12 IMAGING — US US ABDOMEN COMPLETE W/ ELASTOGRAPHY
1 series · 13 of 25 positions shown · non-contrast
Comparison: Abdominal ultrasound dated 03/04/2016

ADDENDUM:
Dictation error in the findings of the original report.

Liver: No focal lesion/abnormality is seen. Normal parenchymal
echogenicity.
The remainder of the report, including the impression, is unchanged.
CLINICAL DATA: Hepatitis C
EXAM:
ULTRASOUND ABDOMEN
ULTRASOUND HEPATIC ELASTOGRAPHY
TECHNIQUE: Sonography of the upper abdomen was performed. In addition,
ultrasound elastography evaluation of the liver was performed. A
region of interest was placed within the right lobe of the liver.
Following application of a compressive sonographic pulse, shear
waves were detected in the adjacent hepatic tissue and the shear
wave velocity was calculated. Multiple assessments were performed at
the selected site. Median shear wave velocity is correlated to a
Metavir fibrosis score.

[Series 1: us abdomen complete w/ elastography · 0.20mm/px · 13 of 92 slices shown]
[im 1/92]
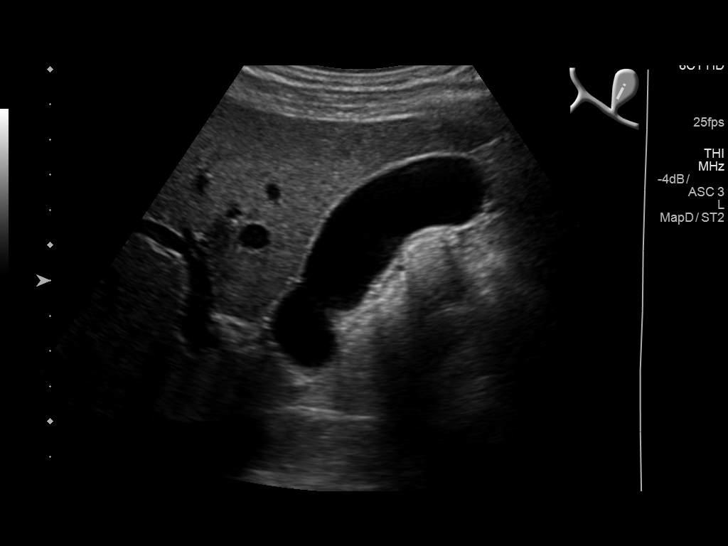
[im 8/92]
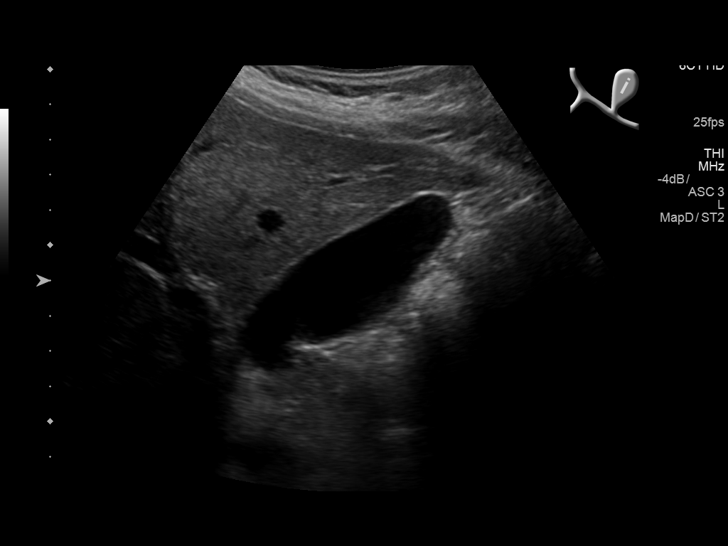
[im 16/92]
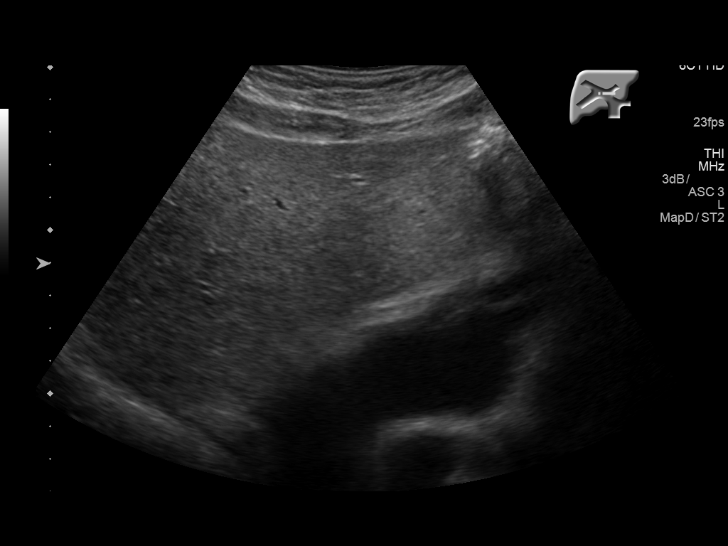
[im 23/92]
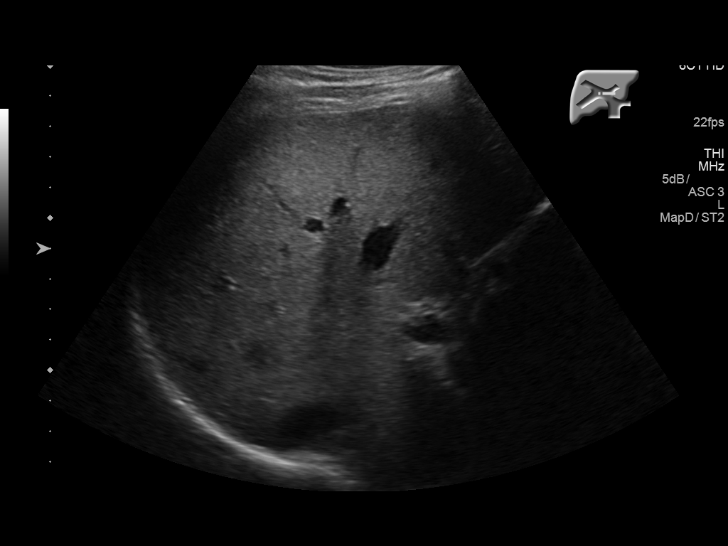
[im 31/92]
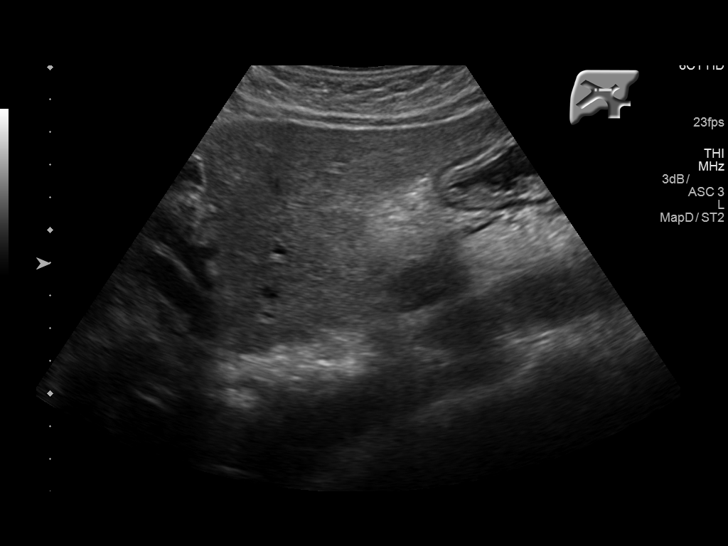
[im 38/92]
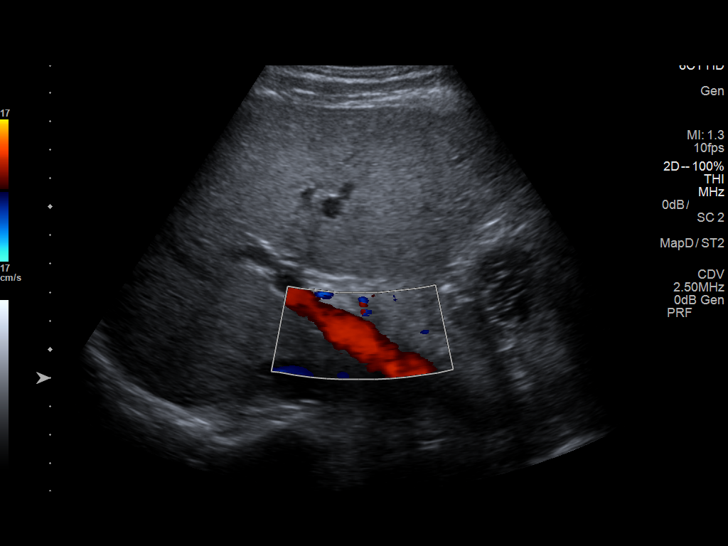
[im 46/92]
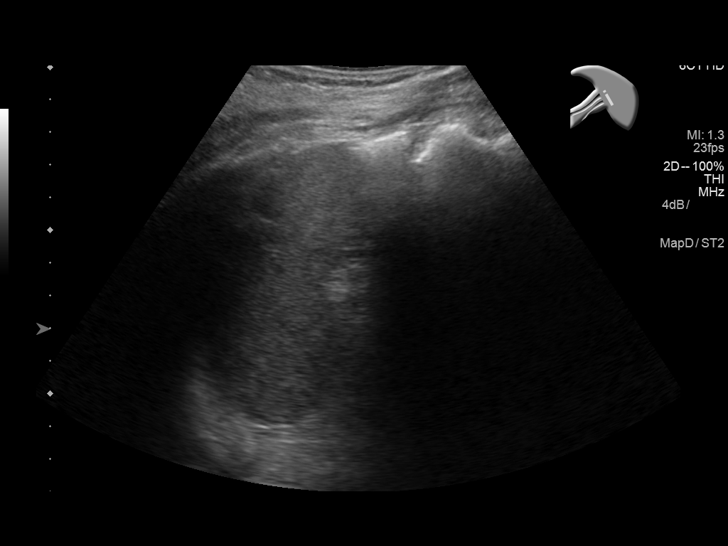
[im 54/92]
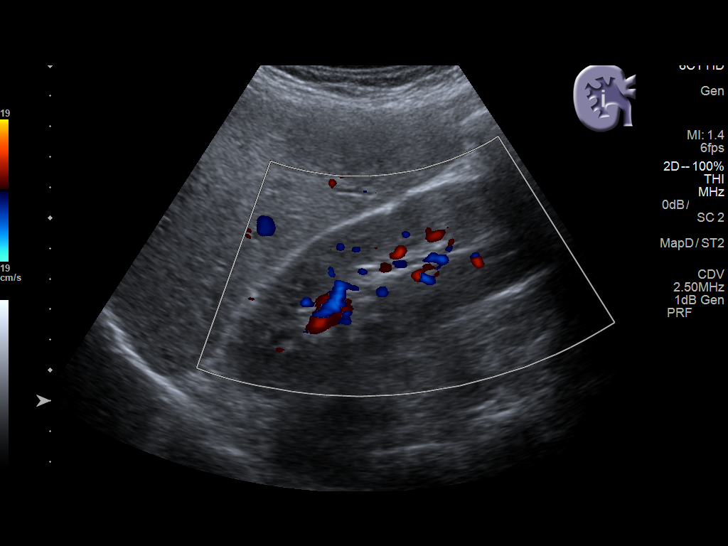
[im 61/92]
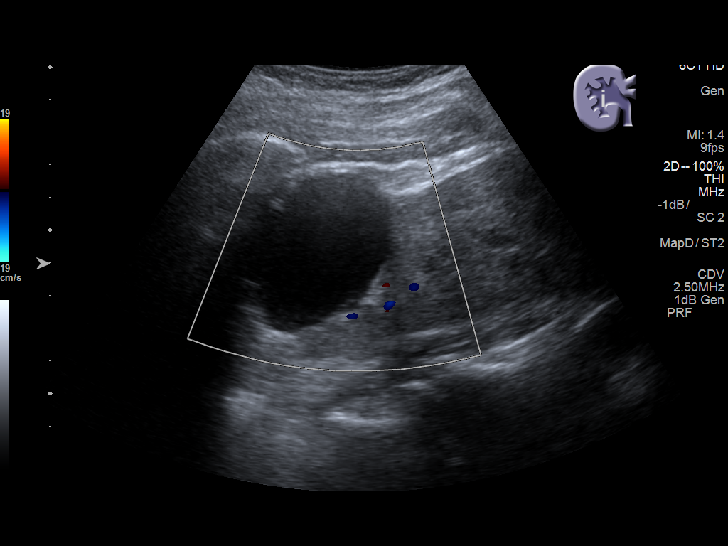
[im 69/92]
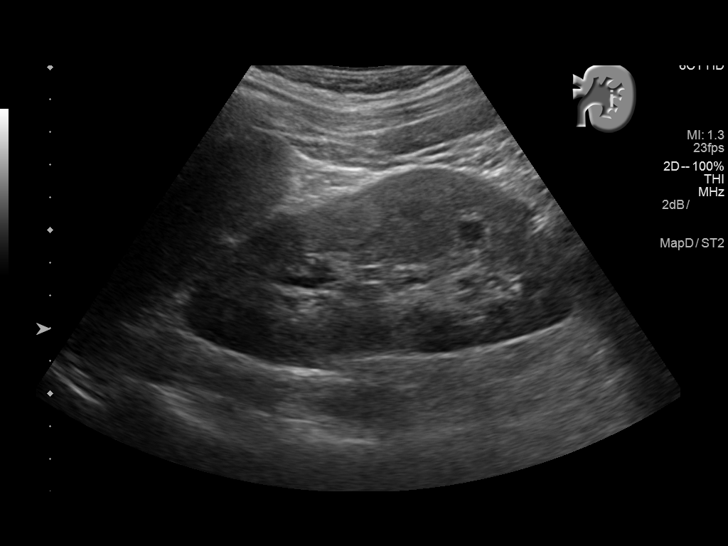
[im 76/92]
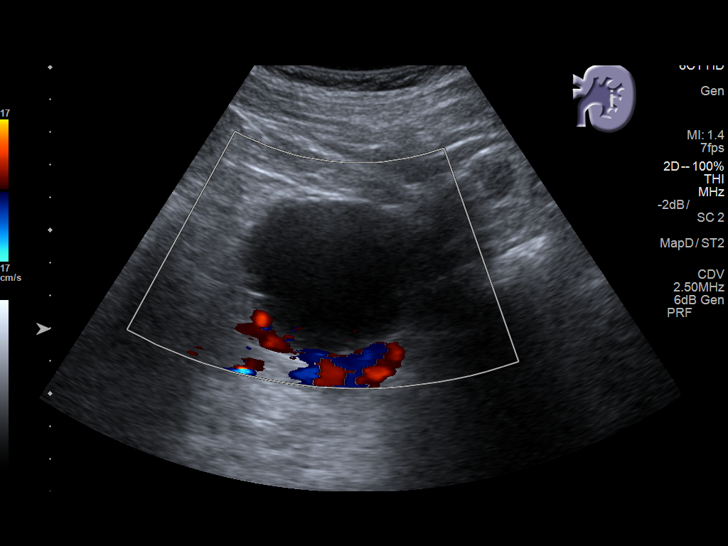
[im 84/92]
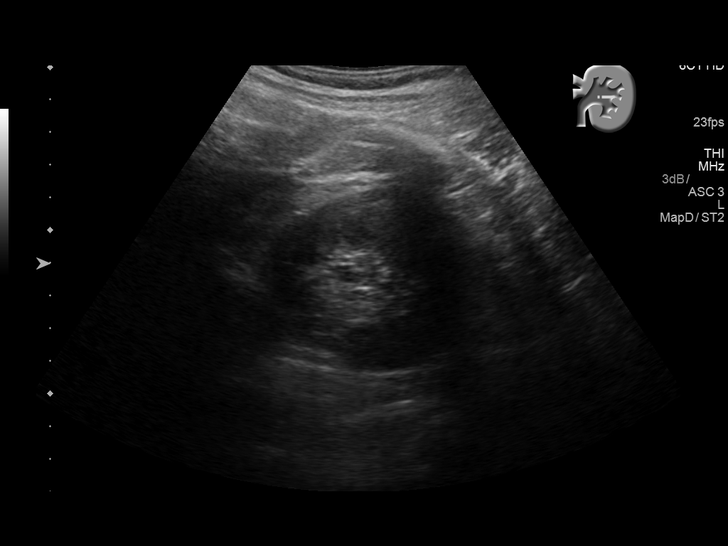
[im 92/92]
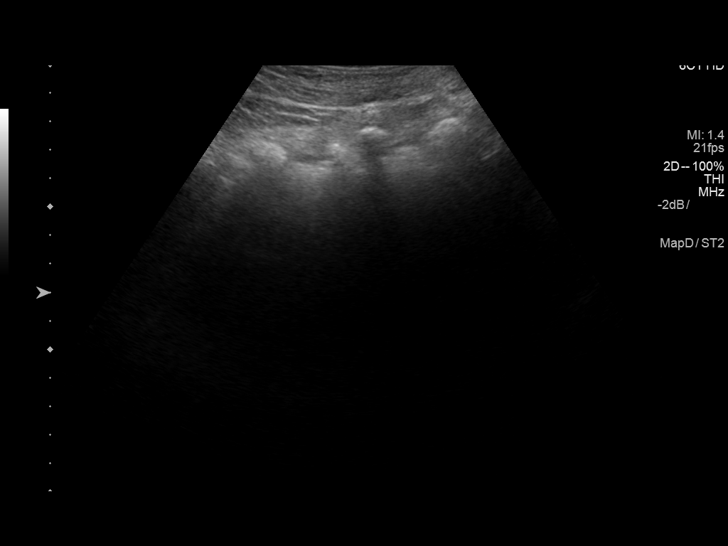

[13 of 25 positions shown; findings below may reference images not displayed]

FINDINGS: ULTRASOUND ABDOMEN

Gallbladder: No gallstones, gallbladder wall thickening, or
pericholecystic fluid.

Common bile duct: Diameter: 3 mm

Liver: Ears

IVC: No abnormality visualized.

Pancreas: Poorly visualized due to overlying bowel gas.

Spleen: Size and appearance within normal limits.

Right Kidney: Length: 12.0 cm. 5.1 x 5.5 x 5.6 cm upper pole cyst.
No hydronephrosis.

Left Kidney: Length: 12.3 cm. 5.6 x 5.2 x 5.4 cm interpolar cyst. No
hydronephrosis.

Abdominal aorta: No aneurysm visualized.

Other findings: None.

ULTRASOUND HEPATIC ELASTOGRAPHY

Device: Siemens Helix VTQ

Patient position: Left Lateral Decubitus

Transducer [id number]

Number of measurements: 10

Hepatic segment:  8

Median velocity:   1.38  m/sec

IQR:

IQR/Median velocity ratio:

Corresponding Metavir fibrosis score:  F2 + some F3

Risk of fibrosis: Moderate

Limitations of exam: None

Pertinent findings noted on other imaging exams:  None

Please note that abnormal shear wave velocities may also be
identified in clinical settings other than with hepatic fibrosis,
such as: acute hepatitis, elevated right heart and central venous
pressures including use of beta blockers, Sapu disease
([place]), infiltrative processes such as
mastocytosis/amyloidosis/infiltrative tumor, extrahepatic
cholestasis, in the post-prandial state, and liver transplantation.
Correlation with patient history, laboratory data, and clinical
condition recommended.
IMPRESSION: ULTRASOUND ABDOMEN:
Bilateral renal cysts, measuring up to 5.6 cm bilaterally.

ULTRASOUND HEPATIC ELASTOGRAPHY:

Median hepatic shear wave velocity is calculated at 1.38 m/sec.

Corresponding Metavir fibrosis score is  F2 + some F3.

Risk of fibrosis is Moderate.

Follow-up: Additional testing appropriate

## 2017-09-13 MED FILL — LIVALO 1 MG TABLET: 1 | 84 days supply | Qty: 24 | Fill #2

## 2017-09-14 ENCOUNTER — Telehealth (INDEPENDENT_AMBULATORY_CARE_PROVIDER_SITE_OTHER): Payer: Self-pay | Admitting: Orthopedic Surgery

## 2017-09-14 NOTE — Telephone Encounter (Signed)
Patient called stating that he has some questions before his next appointment.  CB#[phone].  Thank you.

## 2017-09-14 NOTE — Telephone Encounter (Signed)
Pt is s/p bunion and claw toe surgery and he questions if he still needs to be non weight bearing and keeping his foot elevated until his post op appt. I advised that yes he must continue to do these things as they will help ensure the nest possible outcome for healing. Pressure and swelling can cause the incision to open and he needs to give himself until hsi follow up to receive further instruction from Dr. Sharol Given. Pt voiced understanding and will call with questions.

## 2017-09-21 ENCOUNTER — Ambulatory Visit (INDEPENDENT_AMBULATORY_CARE_PROVIDER_SITE_OTHER): Payer: 59 | Admitting: Orthopedic Surgery

## 2017-09-21 ENCOUNTER — Ambulatory Visit (INDEPENDENT_AMBULATORY_CARE_PROVIDER_SITE_OTHER): Payer: 59

## 2017-09-21 ENCOUNTER — Encounter (INDEPENDENT_AMBULATORY_CARE_PROVIDER_SITE_OTHER): Payer: Self-pay | Admitting: Orthopedic Surgery

## 2017-09-21 VITALS — Ht 70.0 in | Wt 173.0 lb

## 2017-09-21 DIAGNOSIS — M21611 Bunion of right foot: Secondary | ICD-10-CM

## 2017-09-21 DIAGNOSIS — M205X1 Other deformities of toe(s) (acquired), right foot: Secondary | ICD-10-CM

## 2017-09-21 DIAGNOSIS — M79671 Pain in right foot: Secondary | ICD-10-CM | POA: Diagnosis not present

## 2017-09-21 NOTE — Progress Notes (Signed)
Office Visit Note   Patient: [name]           Date of Birth: 04/14/1951           MRN: [id number] Visit Date: 09/21/2017              Requested by: Denita Lung, MD [address] [place], [place] [address] PCP: Denita Lung, MD  Chief Complaint  Patient presents with  . Right Foot - Routine Post Op    08/28/17 right great toe chevron and weil osteotomy 2nd MT      HPI: Patient is a 67 year old gentleman who presents follow-up status post a bunion and claw toe surgery on the right he has no complaints.  Assessment & Plan: Visit Diagnoses:  1. Pain in right foot   2. Bunion of great toe of right foot   3. Claw toe, acquired, right     Plan: Sutures are harvested pin is removed patient will advance to compression socks to help decrease the swelling recommended scar massage with Shea butter recommended stretching the second toe a mouse pad was placed to prevent floating of the second toe.  We will advance to sneakers as he feels comfortable.  Follow-Up Instructions: Return in about 4 weeks (around 10/19/2017).   Ortho Exam  Patient is alert, oriented, no adenopathy, well-dressed, normal affect, normal respiratory effort. Examination the incisions are well-healed we will harvest the sutures today the pin is removed.  Radiographs shows stable alignment of the bunion and claw toe surgery.  Patient does have venostasis swelling in his foot.  Imaging: Xr Foot 2 Views Right  Result Date: 09/21/2017 2 view radiographs of the right foot shows a congruent metatarsal head cascade hardware is in place no complicating features.  No images are attached to the encounter.  Labs: No results found for: HGBA1C, ESRSEDRATE, CRP, LABURIC, REPTSTATUS, GRAMSTAIN, CULT, LABORGA  @LABSALLVALUES (HGBA1)@  Body mass index is 24.82 kg/m.  Orders:  Orders Placed This Encounter  Procedures  . XR Foot 2 Views Right   No orders of the defined types were placed in this  encounter.    Procedures: No procedures performed  Clinical Data: No additional findings.  ROS:  All other systems negative, except as noted in the HPI. Review of Systems  Objective: Vital Signs: Ht 5\' 10"  (1.778 m)   Wt 173 lb (78.5 kg)   BMI 24.82 kg/m   Specialty Comments:  No specialty comments available.  PMFS History: Patient Active Problem List   Diagnosis Date Noted  . Bunion of great toe of right foot 07/14/2017  . Claw toe, acquired, right 07/14/2017  . 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  . Abnormal laboratory test 10/09/2016  . Medication management 10/09/2016  . Plantar fasciitis of right foot 02/19/2015  . Depression 08/14/2014  . Family history of heart disease in male family member before age 60 04/26/2014  . Mild cognitive impairment 03/13/2014  . 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 Hyperlipidemia with target LDL less than 100   . Abdominal aortic atherosclerosis ([place])   . Moderate essential hypertension 02/25/2011  . Arthropathy 02/25/2011  . HAV (hallux abducto valgus) 02/25/2011   Past Medical History:  Diagnosis Date  . Allergy   . Arthritis   . BPH (benign prostatic hyperplasia)   . Complication of anesthesia    Pt. stated he had a reaction that ended in him requiring urinary cath placement  . Diverticulosis   . GERD (  gastroesophageal reflux disease)    esophageal spasms  . Glaucoma   . Gout   . Head injury, closed, with concussion   . Hepatitis C    chronic - Has been treated with Harvoni  . HLD (hyperlipidemia)    statin intolerant (Crestor & Simvastatin) - Taking Livalo 1mg  / week  . Hypertension   . Parkinson's disease ([place])   . Plantar fasciitis    right  . PVD (peripheral vascular disease) ([place])    With no claudication; only mild abdominal aortic atherosclerosis noted on ultrasound.  . Thoracic ascending aortic aneurysm (HCC)    4.2 cm ascending TAA 09/2016 CT, 1 yr f/u rec  .  Ulcer     Family History  Problem Relation Age of Onset  . Heart disease Mother   . Cancer Paternal Grandfather   . Cancer Sister     Past Surgical History:  Procedure Laterality Date  . CARDIAC CATHETERIZATION  2005   30% Cx. Dr. Melvern Banker  . cataract surgery Left 11/05/2014  . COLONOSCOPY    . ESOPHAGOGASTRODUODENOSCOPY N/A 05/02/2015   Procedure: ESOPHAGOGASTRODUODENOSCOPY (EGD);  Surgeon: Ronald Lobo, MD;  Location: [facility];  Service: Endoscopy;  Laterality: N/A;  . ESOPHAGOGASTRODUODENOSCOPY  05/02/2015   no source of pt chest pain endoscopically evident. small hiatal hernia.  [name] EYE SURGERY    . MEMBRANE PEEL Left 03/14/2014   Procedure: MEMBRANE PEEL; ENDOLASER;  Surgeon: Hurman Horn, MD;  Location: [facility];  Service: Ophthalmology;  Laterality: Left;  . NM MYOVIEW LTD  2011   Neg Ischemia or infarct.  [name] NM MYOVIEW LTD  10/2015   LOW RISK. Small, fixed basal lateral defect - likely diaphragmatic attenuation. EF 69%  . PARS PLANA VITRECTOMY Left 03/14/2014   Procedure: PARS PLANA VITRECTOMY WITH 25 GAUGE;  Surgeon: Hurman Horn, MD;  Location: [facility];  Service: Ophthalmology;  Laterality: Left;  . TONSILLECTOMY    . TRANSTHORACIC ECHOCARDIOGRAM  2013   Normal EF. No significant Valve Disease  . UPPER GASTROINTESTINAL ENDOSCOPY    . WEIL OSTEOTOMY Right 09/01/2017   Procedure: RIGHT GREAT TOE CHEVRON AND WEIL OSTEOTOMY 2ND METATARSAL;  Surgeon: Newt Minion, MD;  Location: [facility];  Service: Orthopedics;  Laterality: Right;   Social History   Occupational History  . 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 Smoking status: Former Smoker    Types: Cigars    Last attempt to quit: 07/27/1988    Years since quitting: 29.1  . Smokeless tobacco: Never Used  Substance and Sexual Activity  . Alcohol use: Yes    Alcohol/week: 1.8 oz    Types: 1 Glasses of wine, 1 Cans of beer, 1 Shots of liquor per week    Comment: occasional  . Drug use: No  .  Sexual activity: Yes

## 2017-09-22 DIAGNOSIS — N400 Enlarged prostate without lower urinary tract symptoms: Secondary | ICD-10-CM | POA: Diagnosis not present

## 2017-09-23 DIAGNOSIS — K740 Hepatic fibrosis: Secondary | ICD-10-CM | POA: Diagnosis not present

## 2017-09-27 ENCOUNTER — Ambulatory Visit: Payer: 59 | Admitting: Cardiology

## 2017-09-27 ENCOUNTER — Encounter: Payer: Self-pay | Admitting: Cardiology

## 2017-09-27 VITALS — BP 125/81 | HR 79 | Ht 70.5 in | Wt 178.4 lb

## 2017-09-27 DIAGNOSIS — Z79899 Other long term (current) drug therapy: Secondary | ICD-10-CM

## 2017-09-27 DIAGNOSIS — E785 Hyperlipidemia, unspecified: Secondary | ICD-10-CM

## 2017-09-27 DIAGNOSIS — I10 Essential (primary) hypertension: Secondary | ICD-10-CM | POA: Diagnosis not present

## 2017-09-27 DIAGNOSIS — R0609 Other forms of dyspnea: Secondary | ICD-10-CM

## 2017-09-27 DIAGNOSIS — I700 Atherosclerosis of aorta: Secondary | ICD-10-CM

## 2017-09-27 NOTE — Patient Instructions (Addendum)
No change with current medications  LABS PRIOR TO YOUR VISIT WITH EXTENDER. CMP  LIPID   Your physician recommends that you schedule a follow-up appointment in [place]      Your physician wants you to follow-up in 12 months with Dr Melody Haver will receive a reminder letter in the mail two months in advance. If you don't receive a letter, please call our office to schedule the follow-up appointment.   If you need a refill on your cardiac medications before your next appointment, please call your pharmacy.

## 2017-09-27 NOTE — Progress Notes (Signed)
PCP: Denita Lung, MD  Clinic Note: Chief Complaint  Patient presents with  . Follow-up    1 year  . Shortness of Breath    due to cold  . Dizziness    due to medication    HPI: [name] is a 67 y.o. male with a PMH below who presents today for annual follow-up for cardiac risk factors monitoring. He is a former patient of Dr. Myrtice Lauth then he saw Dr. Rollene Fare until he retired.  He is being followed for cardiac risk factors of hypertension, hyperlipidemia and had a catheterization performed in 2005 for chest discomfort which only showed minor coronary disease. He also negative Myoview in 2011. He was last seen by Dr. Rollene Fare in June 2014 following a trip to [place] with his wife & I first saw him in December 2014.  [name] was last seen on September 25, 2016 -he was doing fairly well from a cardiac standpoint without any significant symptoms.  He was not getting significant exercise, but was not noting any exertional chest tightness or pressure.  He is being followed by Dr. Sharol Given for a bunion on the right foot  08/28/17 right great toe chevron and weil osteotomy 2nd MT He is also being evaluated for tremors -with a diagnosis of Parkinson's  Recent Hospitalizations: None  Studies Reviewed: No new studies   Interval History: [name] presents today really bothered by having a cold with cough and congestion and some exertional dyspnea over the last couple weeks.  He does have some tenderness to touch along the chest this been happening off and on since he has been having a cough, but not  necessarily tightness or pressure that is worse with exertion. He is indicating that he really needs to get back into his exercise regimen having gotten away from it for a while.  Following his foot surgery, he took a while to recover and has not gotten back into full baseline activity level.  He denies any exertional dyspnea except with over the last week or so with this illness.  No  PND, orthopnea or edema. No rapid irregular heartbeats palpitations.  No syncope/near syncope or TIA/amaurosis fugax.  No claudication. Does not seem to be as focused on his fatigue today as he was last visit.  He does note that he does not sleep very well because he is up a lot at nighttime to go to urinate.  ROS: A comprehensive was performed. Review of Systems  Constitutional: Positive for fever (Low-grade). Negative for chills, diaphoresis and weight loss.  HENT: Positive for congestion. Negative for nosebleeds.   Respiratory: Positive for cough, shortness of breath and stridor. Negative for sputum production and wheezing.   Gastrointestinal: Negative for blood in stool, constipation and melena.  Genitourinary: Negative for hematuria.       Nocturia  Musculoskeletal: Positive for joint pain. Negative for myalgias.  Neurological: Positive for dizziness (Positional) and tremors (Parkinson seems to be relatively well-controlled). Negative for weakness.  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 The patient is not nervous/anxious and does not have insomnia.   All other systems reviewed and are negative.   Past Medical History:  Diagnosis Date  . Allergy   . Arthritis   . BPH (benign prostatic hyperplasia)   . Complication of anesthesia    Pt. stated he had a reaction that ended in him requiring urinary cath placement  . Diverticulosis   . GERD (gastroesophageal reflux disease)  esophageal spasms  . Glaucoma   . Gout   . Head injury, closed, with concussion   . Hepatitis C    chronic - Has been treated with Harvoni  . HLD (hyperlipidemia)    statin intolerant (Crestor & Simvastatin) - Taking Livalo 1mg  / week  . Hypertension   . Parkinson's disease ([place])   . Plantar fasciitis    right  . PVD (peripheral vascular disease) ([place])    With no claudication; only mild abdominal aortic atherosclerosis noted on ultrasound.  . Thoracic  ascending aortic aneurysm (HCC)    4.2 cm ascending TAA 09/2016 CT, 1 yr f/u rec  . Ulcer     Past Surgical History:  Procedure Laterality Date  . CARDIAC CATHETERIZATION  2005   30% Cx. Dr. Melvern Banker  . cataract surgery Left 11/05/2014  . COLONOSCOPY    . ESOPHAGOGASTRODUODENOSCOPY N/A 05/02/2015   Procedure: ESOPHAGOGASTRODUODENOSCOPY (EGD);  Surgeon: Ronald Lobo, MD;  Location: [facility];  Service: Endoscopy;  Laterality: N/A;  . ESOPHAGOGASTRODUODENOSCOPY  05/02/2015   no source of pt chest pain endoscopically evident. small hiatal hernia.  [name] EYE SURGERY    . MEMBRANE PEEL Left 03/14/2014   Procedure: MEMBRANE PEEL; ENDOLASER;  Surgeon: Hurman Horn, MD;  Location: [place];  Service: Ophthalmology;  Laterality: Left;  . NM MYOVIEW LTD  2011   Neg Ischemia or infarct.  [name] NM MYOVIEW LTD  10/2015   LOW RISK. Small, fixed basal lateral defect - likely diaphragmatic attenuation. EF 69%  . PARS PLANA VITRECTOMY Left 03/14/2014   Procedure: PARS PLANA VITRECTOMY WITH 25 GAUGE;  Surgeon: Hurman Horn, MD;  Location: [place];  Service: Ophthalmology;  Laterality: Left;  . TONSILLECTOMY    . TRANSTHORACIC ECHOCARDIOGRAM  2013   Normal EF. No significant Valve Disease  . UPPER GASTROINTESTINAL ENDOSCOPY    . WEIL OSTEOTOMY Right 09/01/2017   Procedure: RIGHT GREAT TOE CHEVRON AND WEIL OSTEOTOMY 2ND METATARSAL;  Surgeon: Newt Minion, MD;  Location: [place];  Service: Orthopedics;  Laterality: Right;    Current Meds  Medication Sig  . alfuzosin (UROXATRAL) 10 MG 24 hr tablet Take 10 mg by mouth at bedtime.   [name] allopurinol (ZYLOPRIM) 100 MG tablet Take 1 tablet (100 mg total) by mouth 2 (two) times daily.  . carbidopa-levodopa (SINEMET IR) 25-100 MG tablet TAKE 1 AND 1/2 TABLET BY MOUTH 3 TIMES DAILY.  [name] diclofenac sodium (VOLTAREN) 1 % GEL Apply 2 g topically 4 (four) times daily. (Patient taking differently: Apply 2 g topically 2 (two) times daily as needed (pain). )  .  HYDROcodone-acetaminophen (NORCO/VICODIN) 5-325 MG tablet Take 1-2 tablets by mouth every 4 (four) hours as needed for moderate pain.  [name] ibuprofen (ADVIL,MOTRIN) 200 MG tablet Take 400 mg by mouth 2 (two) times daily as needed for headache or moderate pain.  [name] irbesartan (AVAPRO) 150 MG tablet Take 1 tablet (150 mg total) by mouth daily. (Patient taking differently: Take 150 mg by mouth every evening. )  . Multiple Vitamin (MULTIVITAMIN WITH MINERALS) TABS tablet Take 1 tablet by mouth 4 (four) times a week.   . nitroGLYCERIN (NITROSTAT) 0.4 MG SL tablet PLACE 1 TABLET UNDER THE TONGUE EVERY 5 MINUTES AS NEEDED FOR CHEST PAIN.  [name] pantoprazole (PROTONIX) 40 MG tablet Take 40 mg by mouth daily as needed (acid reflux).   . Pitavastatin Calcium (LIVALO) 1 MG TABS Take 1 tablet (1 mg total) by mouth 2 (two) times a week. Take 1 tablet  by mouth two times a week. (Patient taking differently: Take 1 tablet by mouth 2 (two) times a week. On Monday and Friday)  . timolol (TIMOPTIC) 0.5 % ophthalmic solution Place 1 drop into the left eye daily.   . TURMERIC PO Take 1,000 mg by mouth daily.  . verapamil (CALAN-SR) 240 MG CR tablet Take 1 tablet (240 mg total) by mouth daily. (Patient taking differently: Take 240 mg by mouth every evening. )  . vitamin B-12 (CYANOCOBALAMIN) 1000 MCG tablet Take 1,000 mcg by mouth daily as needed (energy).  . [DISCONTINUED] colchicine 0.6 MG tablet Take 1 tablet (0.6 mg total) by mouth daily.  . [DISCONTINUED] indomethacin (INDOCIN) 50 MG capsule Take 1 capsule (50 mg total) by mouth 3 (three) times daily as needed.  . [DISCONTINUED] Liniments (SALONPAS PAIN RELIEF PATCH EX) Apply 1-2 patches topically daily as needed (pain).    No Known Allergies  Social History   Socioeconomic History  . Marital status: Married    Spouse name: barbra gen  . Number of children: 3  . Years of education: AS  . Highest education level: None  Social Needs  . Financial resource strain:  None  . Food insecurity - worry: None  . Food insecurity - inability: None  . Transportation needs - medical: None  . Transportation needs - non-medical: None  Occupational History  . 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 Smoking status: Former Smoker    Types: Cigars    Last attempt to quit: 07/27/1988    Years since quitting: 29.1  . Smokeless tobacco: Never Used  Substance and Sexual Activity  . Alcohol use: Yes    Alcohol/week: 1.8 oz    Types: 1 Glasses of wine, 1 Cans of beer, 1 Shots of liquor per week    Comment: occasional  . Drug use: No  . Sexual activity: Yes  Other Topics Concern  . 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     Wife is retired Therapist, sports from [employer].   He walks roughly 2-3 miles in bed time at least 2-3 times a week. He quit smoking 30 years ago.    family history includes Cancer in his paternal grandfather and sister; Heart disease in his mother.  Wt Readings from Last 3 Encounters:  09/27/17 178 lb 6.4 oz (80.9 kg)  09/21/17 173 lb (78.5 kg)  09/08/17 173 lb (78.5 kg)    PHYSICAL EXAM BP 125/81   Pulse 79   Ht 5' 10.5" (1.791 m)   Wt 178 lb 6.4 oz (80.9 kg)   BMI 25.24 kg/m   Physical Exam  Constitutional: He is oriented to person, place, and time. He appears well-developed and well-nourished. No distress.  HENT:  Head: Normocephalic and atraumatic.  Eyes: Conjunctivae and EOM are normal. Pupils are equal, round, and reactive to light. No scleral icterus.  Neck: Normal range of motion. Neck supple. Normal carotid pulses and no JVD present. Carotid bruit is not present.  Cardiovascular: Normal rate, regular rhythm, S1 normal, S2 normal and intact distal pulses.  Occasional extrasystoles are present. PMI is not displaced. Exam reveals friction rub. Exam reveals no gallop.  Murmur heard.  Medium-pitched crescendo-decrescendo early systolic murmur is  present with a grade of 1/6 at the upper right sternal border and upper left sternal border. Pulmonary/Chest: Effort normal and breath sounds normal. No respiratory distress. He has no  wheezes.  Abdominal: Soft. Bowel sounds are normal. He exhibits no distension. There is no tenderness. There is no rebound.  Musculoskeletal: Normal range of motion. He exhibits edema (1-2+ edema).  Neurological: He is alert and oriented to person, place, and time.  Skin: Skin is warm and dry.  Psychiatric: He has a normal mood and affect. His behavior is normal. Judgment and thought content normal.  Nursing note and vitals reviewed.    Adult ECG Report  Rate: 76 ;  Rhythm: normal sinus rhythm and X deviation (-37). Cannot exclude anteroseptal MI, age undetermined. Otherwise normal intervals and durations without ST and T-wave abnormalities.;   Narrative Interpretation: Stable EKG   Other studies Reviewed: Additional studies/ records that were reviewed today include:  Recent Labs:   Lab Results  Component Value Date   CHOL 153 09/28/2016   HDL 52 09/28/2016   LDLCALC 94 09/28/2016   TRIG 36 09/28/2016   CHOLHDL 2.9 09/28/2016    ASSESSMENT / PLAN: Problem List Items Addressed This Visit    Abdominal aortic atherosclerosis ([place]) (Chronic)    No signs or symptoms of claudication.  There was no suggestion of aneurysmal dilation. Plan will be to continue to treat risk factors of high blood pressure, cholesterol and ensure glycemic control.  Can recheck screening abdominal ultrasound in 1-2 years.      Relevant Orders   EKG 12-Lead (Completed)   DOE (dyspnea on exertion) - Primary    I think his exertional dyspnea is probably related to his current illness, but will need to ensure that this improves as his cold recovers.  I would like to have him come back to see 1 of our extenders Almyra Deforest, [place]) to reassess his symptoms.  If he is still having exertional dyspnea symptoms, would maybe want to consider  at a minimum echo plus or minus ischemic evaluation.      Relevant Orders   EKG 12-Lead (Completed)   Comprehensive metabolic panel   Hyperlipidemia with target LDL less than 100 (Chronic)    We will order follow-up labs to be done today or tomorrow (note will indicate lab results)  Goal LDL should be less than 100.  Continue Livalo.      Relevant Orders   Lipid panel   Comprehensive metabolic panel   Medication management   Relevant Orders   Lipid panel   Comprehensive metabolic panel   Moderate essential hypertension (Chronic)     Stable blood pressure on current dose of irbesartan and verapamil      Relevant Orders   EKG 12-Lead (Completed)   Comprehensive metabolic panel      Current medicines are reviewed at length with the patient today. (+/- concerns) None The following changes have been made: None  Patient Instructions  No change with current medications  LABS PRIOR TO YOUR VISIT WITH EXTENDER. CMP  LIPID   Your physician recommends that you schedule a follow-up appointment in [place]      Your physician wants you to follow-up in 12 months with Dr Melody Haver will receive a reminder letter in the mail two months in advance. If you don't receive a letter, please call our office to schedule the follow-up appointment.   If you need a refill on your cardiac medications before your next appointment, please call your pharmacy.    Studies Ordered:   Orders Placed This Encounter  Procedures  . Lipid panel  . Comprehensive metabolic panel  .  EKG 12-Lead      Glenetta Hew, M.D., M.S. Interventional Cardiologist   Pager # [phone] Phone # [phone] [address] [place], [place] [address]

## 2017-09-29 ENCOUNTER — Encounter: Payer: Self-pay | Admitting: Cardiology

## 2017-09-29 DIAGNOSIS — I10 Essential (primary) hypertension: Secondary | ICD-10-CM | POA: Diagnosis not present

## 2017-09-29 DIAGNOSIS — Z79899 Other long term (current) drug therapy: Secondary | ICD-10-CM | POA: Diagnosis not present

## 2017-09-29 DIAGNOSIS — R0609 Other forms of dyspnea: Secondary | ICD-10-CM | POA: Diagnosis not present

## 2017-09-29 DIAGNOSIS — E785 Hyperlipidemia, unspecified: Secondary | ICD-10-CM | POA: Diagnosis not present

## 2017-09-29 NOTE — Assessment & Plan Note (Signed)
We will order follow-up labs to be done today or tomorrow (note will indicate lab results)  Goal LDL should be less than 100.  Continue Livalo.

## 2017-09-29 NOTE — Assessment & Plan Note (Signed)
Stable blood pressure on current dose of irbesartan and verapamil

## 2017-09-29 NOTE — Assessment & Plan Note (Signed)
No signs or symptoms of claudication.  There was no suggestion of aneurysmal dilation. Plan will be to continue to treat risk factors of high blood pressure, cholesterol and ensure glycemic control.  Can recheck screening abdominal ultrasound in 1-2 years.

## 2017-09-29 NOTE — Assessment & Plan Note (Signed)
I think his exertional dyspnea is probably related to his current illness, but will need to ensure that this improves as his cold recovers.  I would like to have him come back to see 1 of our extenders Almyra Deforest, [place]) to reassess his symptoms.  If he is still having exertional dyspnea symptoms, would maybe want to consider at a minimum echo plus or minus ischemic evaluation.

## 2017-09-30 LAB — LIPID PANEL
Chol/HDL Ratio: 3.1 ratio (ref 0.0–5.0)
Cholesterol, Total: 144 mg/dL (ref 100–199)
HDL: 46 mg/dL (ref >39)
LDL Calculated: 89 mg/dL (ref 0–99)
TRIGLYCERIDES: 47 mg/dL (ref 0–149)
VLDL Cholesterol Cal: 9 mg/dL (ref 5–40)

## 2017-09-30 LAB — COMPREHENSIVE METABOLIC PANEL
A/G RATIO: 1.5 (ref 1.2–2.2)
ALT: 5 IU/L (ref 0–44)
AST: 14 IU/L (ref 0–40)
Albumin: 4.5 g/dL (ref 3.6–4.8)
Alkaline Phosphatase: 78 IU/L (ref 39–117)
BUN/Creatinine Ratio: 15 (ref 10–24)
BUN: 15 mg/dL (ref 8–27)
Bilirubin Total: 0.5 mg/dL (ref 0.0–1.2)
CALCIUM: 9.3 mg/dL (ref 8.6–10.2)
CO2: 24 mmol/L (ref 20–29)
CREATININE: 1.02 mg/dL (ref 0.76–1.27)
Chloride: 100 mmol/L (ref 96–106)
GFR, EST AFRICAN AMERICAN: 88 mL/min/{1.73_m2} (ref >59)
GFR, EST NON AFRICAN AMERICAN: 76 mL/min/{1.73_m2} (ref >59)
GLUCOSE: 82 mg/dL (ref 65–99)
Globulin, Total: 3.1 g/dL (ref 1.5–4.5)
Potassium: 4.5 mmol/L (ref 3.5–5.2)
Sodium: 137 mmol/L (ref 134–144)
TOTAL PROTEIN: 7.6 g/dL (ref 6.0–8.5)

## 2017-09-30 MED FILL — ALLOPURINOL 100 MG TABLET: 100 | 30 days supply | Qty: 60 | Fill #0

## 2017-09-30 MED FILL — ALFUZOSIN HCL ER 10 MG TAB: 10 | 90 days supply | Qty: 90 | Fill #0

## 2017-10-06 ENCOUNTER — Encounter: Payer: Self-pay | Admitting: Family Medicine

## 2017-10-06 ENCOUNTER — Ambulatory Visit (INDEPENDENT_AMBULATORY_CARE_PROVIDER_SITE_OTHER): Payer: 59 | Admitting: Family Medicine

## 2017-10-06 VITALS — BP 128/70 | HR 80 | Temp 99.4°F | Resp 16 | Ht 71.0 in | Wt 177.8 lb

## 2017-10-06 DIAGNOSIS — J0140 Acute pansinusitis, unspecified: Secondary | ICD-10-CM

## 2017-10-06 DIAGNOSIS — R05 Cough: Secondary | ICD-10-CM

## 2017-10-06 DIAGNOSIS — R059 Cough, unspecified: Secondary | ICD-10-CM

## 2017-10-06 MED ORDER — AMOXICILLIN 875 MG PO TABS: 875.0000 mg | ORAL_TABLET | Freq: Two times a day (BID) | ORAL | 0 refills | Status: DC

## 2017-10-06 MED ORDER — BENZONATATE 200 MG PO CAPS: 200.0000 mg | ORAL_CAPSULE | Freq: Two times a day (BID) | ORAL | 0 refills | Status: DC | PRN

## 2017-10-06 MED FILL — BENZONATATE 200 MG CAPSULE: 200 | 10 days supply | Qty: 20 | Fill #0

## 2017-10-06 MED FILL — AMOXICILLIN 875 MG TABLET: 875 | 10 days supply | Qty: 20 | Fill #0

## 2017-10-06 NOTE — Patient Instructions (Addendum)
Take the antibiotic as prescribed.   Take Mucinex for cough and congestion. Salt water gargles for sore throat.   You can take the Tessalon also for cough in the evening or if Mucinex is not helping.   If you are not back to baseline after completing the antibiotic or if you get worse then let me know.

## 2017-10-06 NOTE — Progress Notes (Signed)
Chief Complaint  Patient presents with  . Cough    dry cough x 2 weeks. Not bringing up much mucus. Slight congestion and some chest tightness.     Subjective:  [name] is a 67 y.o. male who presents for a 2 week history of dry cough, chest congestion, post nasal drainage, sneezing and rhinorrhea. His left ear has also felt clogged at times. States he has some tenderness to his chest wall also. Denies fever, chills, chest pain, palpitations, shortness of breath, wheezing, abdominal pain, N/V/D, LE edema.   Treatment to date: Mucinex once, Airborne.  Denies sick contacts.  No other aggravating or relieving factors.  No other c/o.  ROS as in subjective.   Objective: Vitals:   10/06/17 1645  BP: 128/70  Pulse: 80  Resp: 16  Temp: 99.4 F (37.4 C)  SpO2: 98%    General appearance: Alert, WD/WN, no distress, well appearing                             Skin: warm, no rash or pallor                            Head: +frontal and maxillary sinus tenderness L>R                            Eyes: conjunctiva normal, corneas clear, PERRLA                            Ears: pearly TMs with clear fluid behind R TM, external ear canals normal                          Nose: septum midline, turbinates swollen, with erythema and clear discharge             Mouth/throat: MMM, tongue normal, mild pharyngeal erythema                           Neck: supple, no adenopathy, no thyromegaly, nontender                          Heart: RRR, normal S1, S2, no murmurs. LUSB at 2nd intercostal space tenderness to palpation.                          Lungs: CTA bilaterally, no wheezes, rales, or rhonchi      Assessment: Acute non-recurrent pansinusitis - Plan: amoxicillin (AMOXIL) 875 MG tablet  Cough - Plan: benzonatate (TESSALON) 200 MG capsule    Plan: Discussed diagnosis and treatment of acute sinusitis and cough. Amoxil and Tessalon prescribed. He may try Mucinex for cough and congestion as well.   Suggested symptomatic OTC remedies. Nasal saline spray for congestion.  Tylenol or Ibuprofen OTC for fever and malaise.  Call/return if not back to baseline after completing the antibiotic or if he worsens.

## 2017-10-11 ENCOUNTER — Other Ambulatory Visit: Payer: Self-pay | Admitting: Cardiology

## 2017-10-11 DIAGNOSIS — H34812 Central retinal vein occlusion, left eye, with macular edema: Secondary | ICD-10-CM | POA: Diagnosis not present

## 2017-10-11 DIAGNOSIS — H4720 Unspecified optic atrophy: Secondary | ICD-10-CM | POA: Diagnosis not present

## 2017-10-11 DIAGNOSIS — H348122 Central retinal vein occlusion, left eye, stable: Secondary | ICD-10-CM | POA: Diagnosis not present

## 2017-10-11 DIAGNOSIS — H35352 Cystoid macular degeneration, left eye: Secondary | ICD-10-CM | POA: Diagnosis not present

## 2017-10-11 MED FILL — VERAPAMIL ER 240 MG TABLET: 240 | 90 days supply | Qty: 90 | Fill #0

## 2017-10-11 MED FILL — IRBESARTAN 150 MG TABLET: 150 | 90 days supply | Qty: 90 | Fill #2

## 2017-10-11 NOTE — Telephone Encounter (Signed)
REFILL 

## 2017-10-13 ENCOUNTER — Telehealth (INDEPENDENT_AMBULATORY_CARE_PROVIDER_SITE_OTHER): Payer: Self-pay

## 2017-10-13 NOTE — Telephone Encounter (Signed)
Can you please call pt and make appt for tomorrow. Post op pain and swelling.  thanks

## 2017-10-13 NOTE — Telephone Encounter (Signed)
Patient called stating that he is having the same discomfort in his right foot that he had before he had surgery.  Stated that it hurts while walking and little swollen.  Cb# is [phone].  Please advise.  Patient has appt. Tuesday, 10/19/17.

## 2017-10-13 NOTE — Telephone Encounter (Signed)
Talked with patient and advised him of message per [place] and patient stated that he will not be able to come on 10/14/17, due to being in [facility], all day for appts.  Advised patient that if he has any problems before his appt.on Tuesday, 10/19/17 to give [place] call.  Patient understands.

## 2017-10-19 ENCOUNTER — Encounter (INDEPENDENT_AMBULATORY_CARE_PROVIDER_SITE_OTHER): Payer: Self-pay | Admitting: Orthopedic Surgery

## 2017-10-19 ENCOUNTER — Ambulatory Visit (INDEPENDENT_AMBULATORY_CARE_PROVIDER_SITE_OTHER): Payer: 59 | Admitting: Orthopedic Surgery

## 2017-10-19 VITALS — Ht 71.0 in | Wt 177.0 lb

## 2017-10-19 DIAGNOSIS — M21611 Bunion of right foot: Secondary | ICD-10-CM

## 2017-10-19 DIAGNOSIS — M205X1 Other deformities of toe(s) (acquired), right foot: Secondary | ICD-10-CM

## 2017-10-19 NOTE — Progress Notes (Signed)
Office Visit Note   Patient: [name]           Date of Birth: 12-18-1950           MRN: [id number] Visit Date: 10/19/2017              Requested by: Denita Lung, MD [address] [place], [place] [address] PCP: Denita Lung, MD  Chief Complaint  Patient presents with  . Right Foot - Routine Post Op    Right GT chevron weil osteotomy 2nd MT      HPI: Patient presents 7 weeks status post right great toe chevron osteotomy as well as Weil osteotomy for the second metatarsal.  Patient states he feels some swelling beneath the second metatarsal head also feels some firmness over the bunion incision.  Assessment & Plan: Visit Diagnoses:  1. Bunion of great toe of right foot   2. Claw toe, acquired, right     Plan: Recommended scar massage to both incisions he was given a mouse pad.  He may begin exercising.  Follow-up as needed.  Follow-Up Instructions: Return if symptoms worsen or fail to improve.   Ortho Exam  Patient is alert, oriented, no adenopathy, well-dressed, normal affect, normal respiratory effort. Examination patient has a normal gait he is wearing a [place] sneakers.  There is a little bit of retained suture not that can be palpated to the distal aspect of the bunion incision this should resolve with time he will work on scar massage.  Patient was given a mouse pad instructions for scar massage for the second toe.  There is no floating of the toe there is no cellulitis there is some mild swelling he is wearing compression stockings.  Imaging: No results found. No images are attached to the encounter.  Labs: No results found for: HGBA1C, ESRSEDRATE, CRP, LABURIC, REPTSTATUS, GRAMSTAIN, CULT, LABORGA  @LABSALLVALUES (HGBA1)@  Body mass index is 24.69 kg/m.  Orders:  No orders of the defined types were placed in this encounter.  No orders of the defined types were placed in this encounter.    Procedures: No procedures performed  Clinical  Data: No additional findings.  ROS:  All other systems negative, except as noted in the HPI. Review of Systems  Objective: Vital Signs: Ht 5\' 11"  (1.803 m)   Wt 177 lb (80.3 kg)   BMI 24.69 kg/m   Specialty Comments:  No specialty comments available.  PMFS History: Patient Active Problem List   Diagnosis Date Noted  . DOE (dyspnea on exertion) 09/27/2017  . Bunion of great toe of right foot 07/14/2017  . Claw toe, acquired, right 07/14/2017  . 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  . Abnormal laboratory test 10/09/2016  . Medication management 10/09/2016  . Plantar fasciitis of right foot 02/19/2015  . Depression 08/14/2014  . Family history of heart disease in male family member before age 60 04/26/2014  . Mild cognitive impairment 03/13/2014  . 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 Hyperlipidemia with target LDL less than 100   . Abdominal aortic atherosclerosis ([place])   . Moderate essential hypertension 02/25/2011  . Arthropathy 02/25/2011  . HAV (hallux abducto valgus) 02/25/2011   Past Medical History:  Diagnosis Date  . Allergy   . Arthritis   . BPH (benign prostatic hyperplasia)   . Complication of anesthesia    Pt. stated he had a reaction that ended in him requiring urinary cath placement  . Diverticulosis   .  GERD (gastroesophageal reflux disease)    esophageal spasms  . Glaucoma   . Gout   . Head injury, closed, with concussion   . Hepatitis C    chronic - Has been treated with Harvoni  . HLD (hyperlipidemia)    statin intolerant (Crestor & Simvastatin) - Taking Livalo 1mg  / week  . Hypertension   . Parkinson's disease ([place])   . Plantar fasciitis    right  . PVD (peripheral vascular disease) ([place])    With no claudication; only mild abdominal aortic atherosclerosis noted on ultrasound.  . Thoracic ascending aortic aneurysm (HCC)    4.2 cm ascending TAA 09/2016 CT, 1 yr f/u rec  . Ulcer     Family History    Problem Relation Age of Onset  . Heart disease Mother   . Cancer Paternal Grandfather   . Cancer Sister     Past Surgical History:  Procedure Laterality Date  . CARDIAC CATHETERIZATION  2005   30% Cx. Dr. Melvern Banker  . cataract surgery Left 11/05/2014  . COLONOSCOPY    . ESOPHAGOGASTRODUODENOSCOPY N/A 05/02/2015   Procedure: ESOPHAGOGASTRODUODENOSCOPY (EGD);  Surgeon: Ronald Lobo, MD;  Location: [facility];  Service: Endoscopy;  Laterality: N/A;  . ESOPHAGOGASTRODUODENOSCOPY  05/02/2015   no source of pt chest pain endoscopically evident. small hiatal hernia.  [name] EYE SURGERY    . MEMBRANE PEEL Left 03/14/2014   Procedure: MEMBRANE PEEL; ENDOLASER;  Surgeon: Hurman Horn, MD;  Location: [facility];  Service: Ophthalmology;  Laterality: Left;  . NM MYOVIEW LTD  2011   Neg Ischemia or infarct.  [name] NM MYOVIEW LTD  10/2015   LOW RISK. Small, fixed basal lateral defect - likely diaphragmatic attenuation. EF 69%  . PARS PLANA VITRECTOMY Left 03/14/2014   Procedure: PARS PLANA VITRECTOMY WITH 25 GAUGE;  Surgeon: Hurman Horn, MD;  Location: [facility];  Service: Ophthalmology;  Laterality: Left;  . TONSILLECTOMY    . TRANSTHORACIC ECHOCARDIOGRAM  2013   Normal EF. No significant Valve Disease  . UPPER GASTROINTESTINAL ENDOSCOPY    . WEIL OSTEOTOMY Right 09/01/2017   Procedure: RIGHT GREAT TOE CHEVRON AND WEIL OSTEOTOMY 2ND METATARSAL;  Surgeon: Newt Minion, MD;  Location: [facility];  Service: Orthopedics;  Laterality: Right;   Social History   Occupational History  . 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 Smoking status: Former Smoker    Types: Cigars    Last attempt to quit: 07/27/1988    Years since quitting: 29.2  . Smokeless tobacco: Never Used  Substance and Sexual Activity  . Alcohol use: Yes    Alcohol/week: 1.8 oz    Types: 1 Glasses of wine, 1 Cans of beer, 1 Shots of liquor per week    Comment: occasional  . Drug use: No  . Sexual activity: Yes

## 2017-10-27 ENCOUNTER — Telehealth: Payer: Self-pay | Admitting: Nurse Practitioner

## 2017-10-27 NOTE — Telephone Encounter (Signed)
Patient calling to get telephone number for [employer] support group. He also wants to know if we have brochures for exercises he can do for Parkinson's.

## 2017-10-27 NOTE — Telephone Encounter (Signed)
Called patient and advised him this RN will mail him information today: includes resources for community groups, exercise opportunities, online resources, and caregiver support.  He verbalized understanding, appreciation.

## 2017-11-02 ENCOUNTER — Ambulatory Visit: Payer: 59 | Admitting: Physician Assistant

## 2017-11-05 MED FILL — ALLOPURINOL 100 MG TABLET: 100 | 30 days supply | Qty: 60 | Fill #1

## 2017-11-05 MED FILL — TIMOLOL 0.5% EYE DROPS: 0.5 | 90 days supply | Qty: 5 | Fill #0

## 2017-11-09 NOTE — Progress Notes (Signed)
Personally  participated in, made any corrections needed, and agree with history, physical, neuro exam,assessment and plan as stated above.    Antonia Ahern, MD [facility] 

## 2017-11-11 MED FILL — CARBIDOPA-LEVODOPA 25-100 T: 25-100 | 90 days supply | Qty: 405 | Fill #0

## 2017-11-16 DIAGNOSIS — Z0279 Encounter for issue of other medical certificate: Secondary | ICD-10-CM

## 2017-11-22 DIAGNOSIS — H02401 Unspecified ptosis of right eyelid: Secondary | ICD-10-CM | POA: Diagnosis not present

## 2017-11-22 DIAGNOSIS — H34832 Tributary (branch) retinal vein occlusion, left eye, with macular edema: Secondary | ICD-10-CM | POA: Diagnosis not present

## 2017-11-22 DIAGNOSIS — H4720 Unspecified optic atrophy: Secondary | ICD-10-CM | POA: Diagnosis not present

## 2017-11-22 DIAGNOSIS — H35352 Cystoid macular degeneration, left eye: Secondary | ICD-10-CM | POA: Diagnosis not present

## 2017-11-22 DIAGNOSIS — H34812 Central retinal vein occlusion, left eye, with macular edema: Secondary | ICD-10-CM | POA: Diagnosis not present

## 2017-11-29 ENCOUNTER — Ambulatory Visit (INDEPENDENT_AMBULATORY_CARE_PROVIDER_SITE_OTHER): Payer: 59 | Admitting: Orthopedic Surgery

## 2017-11-29 ENCOUNTER — Encounter (INDEPENDENT_AMBULATORY_CARE_PROVIDER_SITE_OTHER): Payer: Self-pay | Admitting: Orthopedic Surgery

## 2017-11-29 DIAGNOSIS — M21611 Bunion of right foot: Secondary | ICD-10-CM

## 2017-11-29 DIAGNOSIS — M1A0710 Idiopathic chronic gout, right ankle and foot, without tophus (tophi): Secondary | ICD-10-CM | POA: Diagnosis not present

## 2017-11-29 DIAGNOSIS — M205X1 Other deformities of toe(s) (acquired), right foot: Secondary | ICD-10-CM

## 2017-11-29 NOTE — Progress Notes (Signed)
Office Visit Note   Patient: [name]           Date of Birth: October 16, 1950           MRN: [id number] Visit Date: 11/29/2017              Requested by: Denita Lung, MD [address] [place], [place] [address] PCP: Denita Lung, MD  Chief Complaint  Patient presents with  . Right Foot - Follow-up, Pain, Routine Post Op      HPI: Patient is a 67 year old gentleman with history of gout status post bunion and claw toe surgery.  Patient complains of burning pain over the medial aspect of the great toe complains of some swelling around the second metatarsal head.  Dates he is currently taking allopurinol 100 mg twice a day.  Assessment & Plan: Visit Diagnoses:  1. Bunion of great toe of right foot   2. Claw toe, acquired, right   3. Idiopathic chronic gout of right foot without tophus     Plan: We will repeat his uric acid level to see if we need to adjust the allopurinol.  Patient was given instructions for scar massage and stretching of the second toe.  Continue with protective sneakers.  Follow-Up Instructions: Return in about 1 month (around 12/27/2017).   Ortho Exam  Patient is alert, oriented, no adenopathy, well-dressed, normal affect, normal respiratory effort. Examination there is no redness no swelling no ulcer no drainage no signs of infection.  There is no hypersensitivity to light touch.  Patient is developing a slight amount of contracture of the second toe due to scarring dorsally.  Patient was demonstrated scar massage and stretching.  Imaging: No results found. No images are attached to the encounter.  Labs: No results found for: HGBA1C, ESRSEDRATE, CRP, LABURIC, REPTSTATUS, GRAMSTAIN, CULT, LABORGA  No results found for: HGBA1C  There is no height or weight on file to calculate BMI.  Orders:  No orders of the defined types were placed in this encounter.  No orders of the defined types were placed in this encounter.    Procedures: No  procedures performed  Clinical Data: No additional findings.  ROS:  All other systems negative, except as noted in the HPI. Review of Systems  Objective: Vital Signs: There were no vitals taken for this visit.  Specialty Comments:  No specialty comments available.  PMFS History: Patient Active Problem List   Diagnosis Date Noted  . DOE (dyspnea on exertion) 09/27/2017  . Bunion of great toe of right foot 07/14/2017  . Claw toe, acquired, right 07/14/2017  . 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  . Abnormal laboratory test 10/09/2016  . Medication management 10/09/2016  . Plantar fasciitis of right foot 02/19/2015  . Depression 08/14/2014  . Family history of heart disease in male family member before age 84 04/26/2014  . Mild cognitive impairment 03/13/2014  . 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 Hyperlipidemia with target LDL less than 100   . Abdominal aortic atherosclerosis ([place])   . Moderate essential hypertension 02/25/2011  . Arthropathy 02/25/2011  . HAV (hallux abducto valgus) 02/25/2011   Past Medical History:  Diagnosis Date  . Allergy   . Arthritis   . BPH (benign prostatic hyperplasia)   . Complication of anesthesia    Pt. stated he had a reaction that ended in him requiring urinary cath placement  . Diverticulosis   . GERD (gastroesophageal reflux disease)  esophageal spasms  . Glaucoma   . Gout   . Head injury, closed, with concussion   . Hepatitis C    chronic - Has been treated with Harvoni  . HLD (hyperlipidemia)    statin intolerant (Crestor & Simvastatin) - Taking Livalo 1mg  / week  . Hypertension   . Parkinson's disease ([place])   . Plantar fasciitis    right  . PVD (peripheral vascular disease) ([place])    With no claudication; only mild abdominal aortic atherosclerosis noted on ultrasound.  . Thoracic ascending aortic aneurysm (HCC)    4.2 cm ascending TAA 09/2016 CT, 1 yr f/u rec  . Ulcer       Family History  Problem Relation Age of Onset  . Heart disease Mother   . Cancer Paternal Grandfather   . Cancer Sister     Past Surgical History:  Procedure Laterality Date  . CARDIAC CATHETERIZATION  2005   30% Cx. Dr. Melvern Banker  . cataract surgery Left 11/05/2014  . COLONOSCOPY    . ESOPHAGOGASTRODUODENOSCOPY N/A 05/02/2015   Procedure: ESOPHAGOGASTRODUODENOSCOPY (EGD);  Surgeon: Ronald Lobo, MD;  Location: [facility];  Service: Endoscopy;  Laterality: N/A;  . ESOPHAGOGASTRODUODENOSCOPY  05/02/2015   no source of pt chest pain endoscopically evident. small hiatal hernia.  [name] EYE SURGERY    . MEMBRANE PEEL Left 03/14/2014   Procedure: MEMBRANE PEEL; ENDOLASER;  Surgeon: Hurman Horn, MD;  Location: [facility];  Service: Ophthalmology;  Laterality: Left;  . NM MYOVIEW LTD  2011   Neg Ischemia or infarct.  [name] NM MYOVIEW LTD  10/2015   LOW RISK. Small, fixed basal lateral defect - likely diaphragmatic attenuation. EF 69%  . PARS PLANA VITRECTOMY Left 03/14/2014   Procedure: PARS PLANA VITRECTOMY WITH 25 GAUGE;  Surgeon: Hurman Horn, MD;  Location: [facility];  Service: Ophthalmology;  Laterality: Left;  . TONSILLECTOMY    . TRANSTHORACIC ECHOCARDIOGRAM  2013   Normal EF. No significant Valve Disease  . UPPER GASTROINTESTINAL ENDOSCOPY    . WEIL OSTEOTOMY Right 09/01/2017   Procedure: RIGHT GREAT TOE CHEVRON AND WEIL OSTEOTOMY 2ND METATARSAL;  Surgeon: Newt Minion, MD;  Location: [facility];  Service: Orthopedics;  Laterality: Right;   Social History   Occupational History  . 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 Smoking status: Former Smoker    Types: Cigars    Last attempt to quit: 07/27/1988    Years since quitting: 29.3  . Smokeless tobacco: Never Used  Substance and Sexual Activity  . Alcohol use: Yes    Alcohol/week: 1.8 oz    Types: 1 Glasses of wine, 1 Cans of beer, 1 Shots of liquor per week    Comment: occasional  . Drug use: No  . Sexual  activity: Yes

## 2017-11-30 LAB — URIC ACID: Uric Acid, Serum: 4.6 mg/dL (ref 4.0–8.0)

## 2017-12-07 ENCOUNTER — Encounter: Payer: Self-pay | Admitting: Family Medicine

## 2017-12-07 ENCOUNTER — Ambulatory Visit (INDEPENDENT_AMBULATORY_CARE_PROVIDER_SITE_OTHER): Payer: 59 | Admitting: Family Medicine

## 2017-12-07 ENCOUNTER — Ambulatory Visit: Payer: Self-pay | Admitting: Family Medicine

## 2017-12-07 DIAGNOSIS — R05 Cough: Secondary | ICD-10-CM | POA: Diagnosis not present

## 2017-12-07 DIAGNOSIS — R0781 Pleurodynia: Secondary | ICD-10-CM | POA: Diagnosis not present

## 2017-12-07 DIAGNOSIS — R059 Cough, unspecified: Secondary | ICD-10-CM
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
## 2017-12-09 ENCOUNTER — Other Ambulatory Visit: Payer: Self-pay | Admitting: Cardiology

## 2017-12-09 MED FILL — LIVALO 1 MG TABLET: 1 | 84 days supply | Qty: 24 | Fill #0

## 2017-12-09 MED FILL — ALLOPURINOL 100 MG TABLET: 100 | 30 days supply | Qty: 60 | Fill #2

## 2017-12-14 ENCOUNTER — Telehealth: Payer: Self-pay | Admitting: Family Medicine
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
## 2017-12-14 NOTE — Telephone Encounter (Signed)
Let set him up for physical
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
## 2017-12-14 NOTE — Telephone Encounter (Signed)
That was supposed to be physical therapy.

## 2017-12-15 ENCOUNTER — Other Ambulatory Visit: Payer: Self-pay

## 2017-12-15 NOTE — Telephone Encounter (Signed)
Set up referral for pt at rehab on [address] [facility]

## 2017-12-27 ENCOUNTER — Ambulatory Visit (INDEPENDENT_AMBULATORY_CARE_PROVIDER_SITE_OTHER): Payer: 59 | Admitting: Orthopedic Surgery

## 2017-12-27 ENCOUNTER — Encounter (INDEPENDENT_AMBULATORY_CARE_PROVIDER_SITE_OTHER): Payer: Self-pay | Admitting: Orthopedic Surgery

## 2017-12-27 VITALS — Ht 70.0 in | Wt 178.0 lb

## 2017-12-27 DIAGNOSIS — M21611 Bunion of right foot: Secondary | ICD-10-CM

## 2017-12-27 DIAGNOSIS — M205X1 Other deformities of toe(s) (acquired), right foot: Secondary | ICD-10-CM

## 2017-12-27 NOTE — Progress Notes (Signed)
Office Visit Note   Patient: [name]           Date of Birth: 06/06/1951           MRN: [id number] Visit Date: 12/27/2017              Requested by: Denita Lung, MD [address] [place], [place] [address] PCP: Denita Lung, MD  Chief Complaint  Patient presents with  . Right Foot - Follow-up    09/01/17 right GT chevron and weil osteotomy 2nd MT      HPI: Patient is a 67 year old gentleman who presents 4 months status post right great toe chevron osteotomy and Weil osteotomy for the second metatarsal.  Patient complains of some pain at the IP joint of the great toe as well as some pain from the swelling at the MTP joint of the second toe.  Assessment & Plan: Visit Diagnoses:  1. Bunion of great toe of right foot   2. Claw toe, acquired, right     Plan: Recommended continue scar massage at the MTP joint of the second toe and stretching of the second toe to prevent floating of the toe.  Continue with a stiff soled walking sneakers that he has.  Reevaluate in 4 weeks if he is still symptomatic.  Follow-Up Instructions: Return in about 1 month (around 01/24/2018).   Ortho Exam  Patient is alert, oriented, no adenopathy, well-dressed, normal affect, normal respiratory effort. Examination the incisions are well-healed there is no keloiding of the scars.  Patient has a little bit of floating of the second toe and with scar massage and stretching this resolves easily.  Patient was given instruction and demonstrated stretching and scar massage for the second toe MTP joint.  He does have some fullness and scar tissue around the joint.  He is point tender to palpation over the IP joint of the great toe there is no redness no swelling no cellulitis no signs of gout infection symptoms seem to be consistent with arthritis of the IP joint.  There is no pain with range of motion of the MTP joint of the great toe.  Imaging: No results found. No images are attached to the  encounter.  Labs: Lab Results  Component Value Date   LABURIC 4.6 11/29/2017     Lab Results  Component Value Date   ALBUMIN 4.5 09/29/2017   ALBUMIN 3.9 09/01/2017   ALBUMIN 4.5 09/28/2016   LABURIC 4.6 11/29/2017    Body mass index is 25.54 kg/m.  Orders:  No orders of the defined types were placed in this encounter.  No orders of the defined types were placed in this encounter.    Procedures: No procedures performed  Clinical Data: No additional findings.  ROS:  All other systems negative, except as noted in the HPI. Review of Systems  Objective: Vital Signs: Ht 5\' 10"  (1.778 m)   Wt 178 lb (80.7 kg)   BMI 25.54 kg/m   Specialty Comments:  No specialty comments available.  PMFS History: Patient Active Problem List   Diagnosis Date Noted  . DOE (dyspnea on exertion) 09/27/2017  . Bunion of great toe of right foot 07/14/2017  . Claw toe, acquired, right 07/14/2017  . 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  . Abnormal laboratory test 10/09/2016  . Medication management 10/09/2016  . Plantar fasciitis of right foot 02/19/2015  . Depression 08/14/2014  . Family history of heart disease in male family  member before age 14 04/26/2014  . Mild cognitive impairment 03/13/2014  . 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 Hyperlipidemia with target LDL less than 100   . Abdominal aortic atherosclerosis ([place])   . Moderate essential hypertension 02/25/2011  . Arthropathy 02/25/2011  . HAV (hallux abducto valgus) 02/25/2011   Past Medical History:  Diagnosis Date  . Allergy   . Arthritis   . BPH (benign prostatic hyperplasia)   . Complication of anesthesia    Pt. stated he had a reaction that ended in him requiring urinary cath placement  . Diverticulosis   . GERD (gastroesophageal reflux disease)    esophageal spasms  . Glaucoma   . Gout   . Head injury, closed, with concussion   . Hepatitis C    chronic - Has been  treated with Harvoni  . HLD (hyperlipidemia)    statin intolerant (Crestor & Simvastatin) - Taking Livalo 1mg  / week  . Hypertension   . Parkinson's disease ([place])   . Plantar fasciitis    right  . PVD (peripheral vascular disease) ([place])    With no claudication; only mild abdominal aortic atherosclerosis noted on ultrasound.  . Thoracic ascending aortic aneurysm (HCC)    4.2 cm ascending TAA 09/2016 CT, 1 yr f/u rec  . Ulcer     Family History  Problem Relation Age of Onset  . Heart disease Mother   . Cancer Paternal Grandfather   . Cancer Sister     Past Surgical History:  Procedure Laterality Date  . CARDIAC CATHETERIZATION  2005   30% Cx. Dr. Melvern Banker  . cataract surgery Left 11/05/2014  . COLONOSCOPY    . ESOPHAGOGASTRODUODENOSCOPY N/A 05/02/2015   Procedure: ESOPHAGOGASTRODUODENOSCOPY (EGD);  Surgeon: Ronald Lobo, MD;  Location: [facility];  Service: Endoscopy;  Laterality: N/A;  . ESOPHAGOGASTRODUODENOSCOPY  05/02/2015   no source of pt chest pain endoscopically evident. small hiatal hernia.  [name] EYE SURGERY    . MEMBRANE PEEL Left 03/14/2014   Procedure: MEMBRANE PEEL; ENDOLASER;  Surgeon: Hurman Horn, MD;  Location: [facility];  Service: Ophthalmology;  Laterality: Left;  . NM MYOVIEW LTD  2011   Neg Ischemia or infarct.  [name] NM MYOVIEW LTD  10/2015   LOW RISK. Small, fixed basal lateral defect - likely diaphragmatic attenuation. EF 69%  . PARS PLANA VITRECTOMY Left 03/14/2014   Procedure: PARS PLANA VITRECTOMY WITH 25 GAUGE;  Surgeon: Hurman Horn, MD;  Location: [facility];  Service: Ophthalmology;  Laterality: Left;  . TONSILLECTOMY    . TRANSTHORACIC ECHOCARDIOGRAM  2013   Normal EF. No significant Valve Disease  . UPPER GASTROINTESTINAL ENDOSCOPY    . WEIL OSTEOTOMY Right 09/01/2017   Procedure: RIGHT GREAT TOE CHEVRON AND WEIL OSTEOTOMY 2ND METATARSAL;  Surgeon: Newt Minion, MD;  Location: [facility];  Service: Orthopedics;  Laterality: Right;   Social History    Occupational History  . 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 Smoking status: Former Smoker    Types: Cigars    Last attempt to quit: 07/27/1988    Years since quitting: 29.4  . Smokeless tobacco: Never Used  Substance and Sexual Activity  . Alcohol use: Yes    Alcohol/week: 1.8 oz    Types: 1 Glasses of wine, 1 Cans of beer, 1 Shots of liquor per week    Comment: occasional  . Drug use: No  . Sexual activity: Yes

## 2017-12-28 ENCOUNTER — Encounter: Payer: Self-pay | Admitting: Family Medicine

## 2017-12-28 ENCOUNTER — Ambulatory Visit: Payer: 59 | Admitting: Family Medicine

## 2017-12-28 VITALS — BP 118/84 | HR 71 | Temp 97.5°F | Ht 68.0 in | Wt 174.4 lb

## 2017-12-28 DIAGNOSIS — Z79899 Other long term (current) drug therapy: Secondary | ICD-10-CM

## 2017-12-28 DIAGNOSIS — G20 Parkinson's disease: Secondary | ICD-10-CM | POA: Diagnosis not present

## 2017-12-28 DIAGNOSIS — N3281 Overactive bladder: Secondary | ICD-10-CM

## 2017-12-28 DIAGNOSIS — G3184 Mild cognitive impairment, so stated: Secondary | ICD-10-CM | POA: Diagnosis not present

## 2017-12-28 DIAGNOSIS — F329 Major depressive disorder, single episode, unspecified: Secondary | ICD-10-CM | POA: Diagnosis not present

## 2017-12-28 DIAGNOSIS — E785 Hyperlipidemia, unspecified: Secondary | ICD-10-CM

## 2017-12-28 DIAGNOSIS — M48062 Spinal stenosis, lumbar region with neurogenic claudication: Secondary | ICD-10-CM

## 2017-12-28 DIAGNOSIS — Z0000 Encounter for general adult medical examination without abnormal findings: Secondary | ICD-10-CM

## 2017-12-28 DIAGNOSIS — G20C Parkinsonism, unspecified: Secondary | ICD-10-CM

## 2017-12-28 DIAGNOSIS — H9111 Presbycusis, right ear: Secondary | ICD-10-CM

## 2017-12-28 DIAGNOSIS — F32A Depression, unspecified: Secondary | ICD-10-CM

## 2017-12-28 LAB — POCT URINALYSIS DIP (PROADVANTAGE DEVICE)
Bilirubin, UA: NEGATIVE
Blood, UA: NEGATIVE
GLUCOSE UA: NEGATIVE mg/dL
Leukocytes, UA: NEGATIVE
Nitrite, UA: NEGATIVE
Protein Ur, POC: NEGATIVE mg/dL
SPECIFIC GRAVITY, URINE: 1.025
UUROB: 3.5
pH, UA: 6 (ref 5.0–8.0)
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
## 2017-12-29 ENCOUNTER — Other Ambulatory Visit: Payer: Self-pay

## 2017-12-29 ENCOUNTER — Encounter: Payer: Self-pay | Admitting: Physical Therapy

## 2017-12-29 ENCOUNTER — Ambulatory Visit: Payer: 59 | Attending: Family Medicine | Admitting: Physical Therapy

## 2017-12-29 DIAGNOSIS — M62830 Muscle spasm of back: Secondary | ICD-10-CM | POA: Diagnosis not present

## 2017-12-29 DIAGNOSIS — G8929 Other chronic pain: Secondary | ICD-10-CM | POA: Insufficient documentation

## 2017-12-29 DIAGNOSIS — M545 Low back pain: Secondary | ICD-10-CM | POA: Insufficient documentation

## 2017-12-29 DIAGNOSIS — M25552 Pain in left hip: Secondary | ICD-10-CM | POA: Diagnosis not present

## 2017-12-29 DIAGNOSIS — M25651 Stiffness of right hip, not elsewhere classified: Secondary | ICD-10-CM | POA: Insufficient documentation

## 2017-12-29 DIAGNOSIS — M5417 Radiculopathy, lumbosacral region: Secondary | ICD-10-CM | POA: Insufficient documentation
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
## 2018-01-03 ENCOUNTER — Telehealth: Payer: Self-pay | Admitting: Family Medicine

## 2018-01-03 ENCOUNTER — Encounter: Payer: Self-pay | Admitting: Physical Therapy

## 2018-01-03 ENCOUNTER — Ambulatory Visit: Payer: 59 | Admitting: Physical Therapy

## 2018-01-03 DIAGNOSIS — H35352 Cystoid macular degeneration, left eye: Secondary | ICD-10-CM | POA: Diagnosis not present

## 2018-01-03 DIAGNOSIS — G8929 Other chronic pain: Secondary | ICD-10-CM | POA: Diagnosis not present

## 2018-01-03 DIAGNOSIS — M5417 Radiculopathy, lumbosacral region: Secondary | ICD-10-CM | POA: Diagnosis not present

## 2018-01-03 DIAGNOSIS — M25552 Pain in left hip: Secondary | ICD-10-CM

## 2018-01-03 DIAGNOSIS — M62830 Muscle spasm of back: Secondary | ICD-10-CM

## 2018-01-03 DIAGNOSIS — M545 Low back pain: Principal | ICD-10-CM

## 2018-01-03 DIAGNOSIS — H359 Unspecified retinal disorder: Secondary | ICD-10-CM | POA: Diagnosis not present

## 2018-01-03 DIAGNOSIS — H02401 Unspecified ptosis of right eyelid: Secondary | ICD-10-CM | POA: Diagnosis not present

## 2018-01-03 DIAGNOSIS — H18231 Secondary corneal edema, right eye: Secondary | ICD-10-CM | POA: Diagnosis not present

## 2018-01-03 DIAGNOSIS — H34812 Central retinal vein occlusion, left eye, with macular edema: Secondary | ICD-10-CM | POA: Diagnosis not present

## 2018-01-03 DIAGNOSIS — M25651 Stiffness of right hip, not elsewhere classified: Secondary | ICD-10-CM | POA: Diagnosis not present

## 2018-01-03 DIAGNOSIS — H4720 Unspecified optic atrophy: Secondary | ICD-10-CM | POA: Diagnosis not present

## 2018-01-03 NOTE — Telephone Encounter (Signed)
Pt called & states would like referral for another cortisone injection in his back to where he went before Dr. Lawrence Santiago at [facility]

## 2018-01-03 NOTE — Therapy (Signed)
[place], [place], [address] Phone: [phone]   Fax:  [phone]  Physical Therapy Treatment  Patient Details  Name: [name] MRN: [id number] Date of Birth: [birth year] Referring Provider: Jill Alexanders MD   Encounter Date: 01/03/2018  PT End of Session - 01/03/18 1803    Visit Number  2    Number of Visits  13    Date for PT Re-Evaluation  02/09/18    PT Start Time  3300    PT Stop Time  1432    PT Time Calculation (min)  55 min    Activity Tolerance  Patient tolerated treatment well    Behavior During Therapy  [facility] for tasks assessed/performed       Past Medical History:  Diagnosis Date  . Allergy   . Arthritis   . BPH (benign prostatic hyperplasia)   . Complication of anesthesia    Pt. stated he had a reaction that ended in him requiring urinary cath placement  . Diverticulosis   . GERD (gastroesophageal reflux disease)    esophageal spasms  . Glaucoma   . Gout   . Head injury, closed, with concussion   . Hepatitis C    chronic - Has been treated with Harvoni  . HLD (hyperlipidemia)    statin intolerant (Crestor & Simvastatin) - Taking Livalo 1m / week  . Hypertension   . Parkinson's disease ([place]   . Plantar fasciitis    right  . PVD (peripheral vascular disease) ([place]    With no claudication; only mild abdominal aortic atherosclerosis noted on ultrasound.  . Thoracic ascending aortic aneurysm (HCC)    4.2 cm ascending TAA 09/2016 CT, 1 yr f/u rec  . Ulcer     Past Surgical History:  Procedure Laterality Date  . CARDIAC CATHETERIZATION  2005   30% Cx. Dr. GMelvern Banker . cataract surgery Left 11/05/2014  . COLONOSCOPY    . ESOPHAGOGASTRODUODENOSCOPY N/A 05/02/2015   Procedure: ESOPHAGOGASTRODUODENOSCOPY (EGD);  Surgeon: RRonald Lobo MD;  [facility];  Service: Endoscopy;  Laterality: N/A;  . ESOPHAGOGASTRODUODENOSCOPY  05/02/2015   no source of pt chest pain  endoscopically evident. small hiatal hernia.  .Marland KitchenEYE SURGERY    . MEMBRANE PEEL Left 03/14/2014   Procedure: MEMBRANE PEEL; ENDOLASER;  Surgeon: GHurman Horn MD;  [place] [facility]  Service: Ophthalmology;  Laterality: Left;  . NM MYOVIEW LTD  2011   Neg Ischemia or infarct.  .Marland KitchenNM MYOVIEW LTD  10/2015   LOW RISK. Small, fixed basal lateral defect - likely diaphragmatic attenuation. EF 69%  . PARS PLANA VITRECTOMY Left 03/14/2014   Procedure: PARS PLANA VITRECTOMY WITH 25 GAUGE;  Surgeon: GHurman Horn MD;  [place]  Service: Ophthalmology;  Laterality: Left;  . TONSILLECTOMY    . TRANSTHORACIC ECHOCARDIOGRAM  2013   Normal EF. No significant Valve Disease  . UPPER GASTROINTESTINAL ENDOSCOPY    . WEIL OSTEOTOMY Right 09/01/2017   Procedure: RIGHT GREAT TOE CHEVRON AND WEIL OSTEOTOMY 2ND METATARSAL;  Surgeon: DNewt Minion MD;  [place] [facility]  Service: Orthopedics;  Laterality: Right;    There were no vitals filed for this visit.  Subjective Assessment - 01/03/18 1338    Subjective  Pain starts in the back,  then increased into leg left. Some of the exercise aggravate. He called MD  today and requested cortizone shot ,  He has a Vacation in 2 weeks until he goes out  of the country.      Currently in Pain?  Yes    Pain Score  7     Pain Location  Hip Has back pain constant.    Pain Orientation  Right;Lateral;Posterior    Pain Descriptors / Indicators  Aching;Sore;Tightness    Pain Radiating Towards  left back   into thigh    Pain Frequency  Intermittent    Aggravating Factors   some exercises    Pain Relieving Factors  Stop restm  tylenol,  stretching    Multiple Pain Sites  -- back is constant,                        [facility] Adult PT Treatment/Exercise - 01/03/18 0001      Knee/Hip Exercises: Stretches   Active Hamstring Stretch  2 reps;30 seconds sheet used cued    Quad Stretch  2 reps;20 seconds    Hip Flexor Stretch  2 reps;20 seconds    Piriformis  Stretch  2 reps;30 seconds      Knee/Hip Exercises: Supine   Bridges  -- 1 x to check hips    Straight Leg Raises  10 reps      Modalities   Modalities  Moist Heat      Moist Heat Therapy   Number Minutes Moist Heat  10 Minutes    Moist Heat Location  Lumbar Spine On side      Manual Therapy   Manual therapy comments  strumming to quadratus lumborum,  passive stretch to same soft tissue instrumant assist hip ,  softer tissue    Muscle Energy Technique  5 x 5 second hold resisted hip flexion             PT Education - 01/03/18 1800    Education Details  exercise form,  how to level hips with exercise,      Person(s) Educated  Patient    Methods  Explanation;Demonstration;Tactile cues;Verbal cues;Handout    Comprehension  Verbalized understanding;Returned demonstration       PT Short Term Goals - 12/29/17 1015      PT SHORT TERM GOAL #1   Title  pt to be I with initial HEP    Time  3    Period  Weeks    Status  New    Target Date  01/19/18      PT SHORT TERM GOAL #2   Title  pt to verbalize and demo proper posture and lifting mechanics to prevent and reduce low back pain     Time  3    Period  Weeks    Status  New    Target Date  01/19/18        PT Long Term Goals - 12/29/17 1016      PT LONG TERM GOAL #1   Title  pt maintain functional trunk flexion/ extension and improve R side bending by >/= 8 degrees  with </= 1/10 pain for functional mobility required for ADLs     Time  6    Period  Weeks    Status  New    Target Date  02/09/18      PT LONG TERM GOAL #2   Title  pt to be able to sit, stand and walk for >/= 60 min reporting </= 1/10 pain for functional mobility/ endurance required for work related activities    Time  6    Period  Weeks  Status  New    Target Date  02/09/18      PT LONG TERM GOAL #3   Title  pt to increase FOTO score to </= 41% limited to demo improvement in function     Time  6    Period  Weeks    Status  New    Target Date   02/09/18      PT LONG TERM GOAL #4   Title  pt to be I with all HEP given as of last visit to maintain and progress current level of function    Time  6    Period  Weeks    Status  New    Target Date  02/09/18            Plan - 01/03/18 1805    Clinical Impression Statement  Hips unlevel initially.  pain reduced post session with less thigh pain.  patient has asked MD for an injection due to leaving country in 2 weeks and he has to be able to walk a lot.  Patient had surgery right foot 3 months ago and has pain every step.  this may be altering gait.     PT Next Visit Plan  review/ update HEP, assess hip strength posterior innominate rotation on the L, hamstring stretching, SLR, core activation, low back stretching,     PT Home Exercise Plan  hamstring stretching, SLR, self MET for L hip flexion, lower trunk rotation.     Consulted and Agree with Plan of Care  Patient       Patient will benefit from skilled therapeutic intervention in order to improve the following deficits and impairments:     Visit Diagnosis: Chronic left-sided low back pain, with sciatica presence unspecified  Pain in left hip  Muscle spasm of back  Radiculopathy, lumbosacral region  Stiffness of right hip, not elsewhere classified     Problem List Patient Active Problem List   Diagnosis Date Noted  . DOE (dyspnea on exertion) 09/27/2017  . Bunion of great toe of right foot 07/14/2017  . Claw toe, acquired, right 07/14/2017  . 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  . Medication management 10/09/2016  . Plantar fasciitis of right foot 02/19/2015  . Depression 08/14/2014  . Family history of heart disease in male family member before age 69 04/26/2014  . Mild cognitive impairment 03/13/2014  . Parkinsonian tremor ([place]) 02/12/2014  . Hyperlipidemia with target LDL less than 100   . Abdominal aortic atherosclerosis ([place])   . Moderate essential hypertension  02/25/2011  . Arthropathy 02/25/2011  . HAV (hallux abducto valgus) 02/25/2011    Abubakr Wieman PTA 01/03/2018, 6:09 PM  [facility] [address] [place], [place], [address] Phone: [phone]   Fax:  [phone]  Name: [name] MRN: [id number] Date of Birth: 08/18/1950

## 2018-01-03 NOTE — Telephone Encounter (Signed)
Ok

## 2018-01-05 ENCOUNTER — Encounter: Payer: Self-pay | Admitting: Physical Therapy

## 2018-01-05 ENCOUNTER — Other Ambulatory Visit: Payer: Self-pay | Admitting: Ophthalmology

## 2018-01-05 ENCOUNTER — Ambulatory Visit: Payer: 59 | Admitting: Physical Therapy

## 2018-01-05 DIAGNOSIS — M62830 Muscle spasm of back: Secondary | ICD-10-CM | POA: Diagnosis not present

## 2018-01-05 DIAGNOSIS — M545 Low back pain: Secondary | ICD-10-CM | POA: Diagnosis not present

## 2018-01-05 DIAGNOSIS — M5417 Radiculopathy, lumbosacral region: Secondary | ICD-10-CM

## 2018-01-05 DIAGNOSIS — M25552 Pain in left hip: Secondary | ICD-10-CM | POA: Diagnosis not present

## 2018-01-05 DIAGNOSIS — H02834 Dermatochalasis of left upper eyelid: Secondary | ICD-10-CM | POA: Diagnosis not present

## 2018-01-05 DIAGNOSIS — H05241 Constant exophthalmos, right eye: Secondary | ICD-10-CM | POA: Diagnosis not present

## 2018-01-05 DIAGNOSIS — M25651 Stiffness of right hip, not elsewhere classified: Secondary | ICD-10-CM

## 2018-01-05 DIAGNOSIS — H57813 Brow ptosis, bilateral: Secondary | ICD-10-CM | POA: Diagnosis not present

## 2018-01-05 DIAGNOSIS — H0279 Other degenerative disorders of eyelid and periocular area: Secondary | ICD-10-CM | POA: Diagnosis not present

## 2018-01-05 DIAGNOSIS — H02423 Myogenic ptosis of bilateral eyelids: Secondary | ICD-10-CM | POA: Diagnosis not present

## 2018-01-05 DIAGNOSIS — G8929 Other chronic pain: Secondary | ICD-10-CM | POA: Diagnosis not present

## 2018-01-05 DIAGNOSIS — H02831 Dermatochalasis of right upper eyelid: Secondary | ICD-10-CM | POA: Diagnosis not present

## 2018-01-05 DIAGNOSIS — H53483 Generalized contraction of visual field, bilateral: Secondary | ICD-10-CM | POA: Diagnosis not present

## 2018-01-05 DIAGNOSIS — H02413 Mechanical ptosis of bilateral eyelids: Secondary | ICD-10-CM | POA: Diagnosis not present

## 2018-01-05 NOTE — Patient Instructions (Signed)
Issued from ex drawer quadriped Quadratus lumborum stretch PRN 3-5 X 20 + seconds

## 2018-01-05 NOTE — Therapy (Signed)
[place], [place], [address] Phone: [phone]   Fax:  [phone]  Physical Therapy Treatment  Patient Details  Name: [name] MRN: [id number] Date of Birth: 08/22/1950 Referring Provider: Jill Alexanders MD   Encounter Date: 01/05/2018  PT End of Session - 01/05/18 1130    Visit Number  3    Number of Visits  13    Date for PT Re-Evaluation  02/09/18    PT Start Time  1020    PT Stop Time  1117    PT Time Calculation (min)  57 min    Activity Tolerance  Patient tolerated treatment well    Behavior During Therapy  [facility] for tasks assessed/performed       Past Medical History:  Diagnosis Date  . Allergy   . Arthritis   . BPH (benign prostatic hyperplasia)   . Complication of anesthesia    Pt. stated he had a reaction that ended in him requiring urinary cath placement  . Diverticulosis   . GERD (gastroesophageal reflux disease)    esophageal spasms  . Glaucoma   . Gout   . Head injury, closed, with concussion   . Hepatitis C    chronic - Has been treated with Harvoni  . HLD (hyperlipidemia)    statin intolerant (Crestor & Simvastatin) - Taking Livalo 23m / week  . Hypertension   . Parkinson's disease ([place]   . Plantar fasciitis    right  . PVD (peripheral vascular disease) ([place]    With no claudication; only mild abdominal aortic atherosclerosis noted on ultrasound.  . Thoracic ascending aortic aneurysm (HCC)    4.2 cm ascending TAA 09/2016 CT, 1 yr f/u rec  . Ulcer     Past Surgical History:  Procedure Laterality Date  . CARDIAC CATHETERIZATION  2005   30% Cx. Dr. GMelvern Banker . cataract surgery Left 11/05/2014  . COLONOSCOPY    . ESOPHAGOGASTRODUODENOSCOPY N/A 05/02/2015   Procedure: ESOPHAGOGASTRODUODENOSCOPY (EGD);  Surgeon: RRonald Lobo MD;  [facility];  Service: Endoscopy;  Laterality: N/A;  . ESOPHAGOGASTRODUODENOSCOPY  05/02/2015   no source of pt chest pain  endoscopically evident. small hiatal hernia.  .Marland KitchenEYE SURGERY    . MEMBRANE PEEL Left 03/14/2014   Procedure: MEMBRANE PEEL; ENDOLASER;  Surgeon: GHurman Horn MD;  [facility]  Service: Ophthalmology;  Laterality: Left;  . NM MYOVIEW LTD  2011   Neg Ischemia or infarct.  .Marland KitchenNM MYOVIEW LTD  10/2015   LOW RISK. Small, fixed basal lateral defect - likely diaphragmatic attenuation. EF 69%  . PARS PLANA VITRECTOMY Left 03/14/2014   Procedure: PARS PLANA VITRECTOMY WITH 25 GAUGE;  Surgeon: GHurman Horn MD;  [place] [facility]  Service: Ophthalmology;  Laterality: Left;  . TONSILLECTOMY    . TRANSTHORACIC ECHOCARDIOGRAM  2013   Normal EF. No significant Valve Disease  . UPPER GASTROINTESTINAL ENDOSCOPY    . WEIL OSTEOTOMY Right 09/01/2017   Procedure: RIGHT GREAT TOE CHEVRON AND WEIL OSTEOTOMY 2ND METATARSAL;  Surgeon: DNewt Minion MD;  [facility]  Service: Orthopedics;  Laterality: Right;    There were no vitals filed for this visit.  Subjective Assessment - 01/05/18 1032    Subjective  Has been doing the exercises in the morning.  has not been doing them when he has pain    Currently in Pain?  Yes    Pain Score  7     Pain  Location  Hip    Pain Orientation  Left;Posterior    Pain Descriptors / Indicators  Aching;Sore;Tender    Pain Radiating Towards  none now  in left thigh this morning earlier.    Pain Frequency  Intermittent    Aggravating Factors   not sure    Pain Relieving Factors  salon pas patch                       [facility] Adult PT Treatment/Exercise - 01/05/18 0001      Lumbar Exercises: Stretches   Lower Trunk Rotation  -- 10 x cued for pain free,  ROM improving    Pelvic Tilt  -- 10 x mod cues initially  shakey    Other Lumbar Stretch Exercise  QL stretching on knees hands moved to the right and on side with rolled sheet arm overhead    Active Hamstring Stretch  3 reps;30 seconds sheet used cued    Quad Stretch  2 reps;30 seconds right,  to level  pelvis.      Lumbar Exercises: Supine   Clam  10 reps    Clam Limitations  cued,  green band shakey    Bent Knee Raise  10 reps    Bent Knee Raise Limitations  slight rocking of hips,  no increased pain    Other Supine Lumbar Exercises  ball squeeze 10 X in hooklying,  cued for breathing  no pain increase      Knee/Hip Exercises: Stretches   Other Knee/Hip Stretches  PROM right hip  IR  2 X 20 secinds hip tight      Knee/Hip Exercises: Supine   Straight Leg Raises  10 reps right to level pelvis      Modalities   Modalities  Moist Heat      Moist Heat Therapy   Number Minutes Moist Heat  15 Minutes    Moist Heat Location  Lumbar Spine QL             PT Education - 01/05/18 1127    Education Details  HEP,  pelvis mobility/  stabilization /  exercises    Person(s) Educated  Patient    Methods  Explanation;Demonstration;Verbal cues;Handout;Tactile cues    Comprehension  Verbalized understanding;Returned demonstration       PT Short Term Goals - 12/29/17 1015      PT SHORT TERM GOAL #1   Title  pt to be I with initial HEP    Time  3    Period  Weeks    Status  New    Target Date  01/19/18      PT SHORT TERM GOAL #2   Title  pt to verbalize and demo proper posture and lifting mechanics to prevent and reduce low back pain     Time  3    Period  Weeks    Status  New    Target Date  01/19/18        PT Long Term Goals - 12/29/17 1016      PT LONG TERM GOAL #1   Title  pt maintain functional trunk flexion/ extension and improve R side bending by >/= 8 degrees  with </= 1/10 pain for functional mobility required for ADLs     Time  6    Period  Weeks    Status  New    Target Date  02/09/18      PT LONG TERM GOAL #2  Title  pt to be able to sit, stand and walk for >/= 60 min reporting </= 1/10 pain for functional mobility/ endurance required for work related activities    Time  6    Period  Weeks    Status  New    Target Date  02/09/18      PT LONG TERM  GOAL #3   Title  pt to increase FOTO score to </= 41% limited to demo improvement in function     Time  6    Period  Weeks    Status  New    Target Date  02/09/18      PT LONG TERM GOAL #4   Title  pt to be I with all HEP given as of last visit to maintain and progress current level of function    Time  6    Period  Weeks    Status  New    Target Date  02/09/18            Plan - 01/05/18 1131    Clinical Impression Statement  Able to almost  level hips with exercise.  QL tight right.  HEP addresssed this,  Pain 5/10 decreased form 7/10 at end of session.  LTR ROM improvew to WNL.    PT Next Visit Plan  review/ update HEP, assess hip strength posterior innominate rotation on the L, hamstring stretching, SLR, core activation, low back stretching,     PT Home Exercise Plan  hamstring stretching, SLR, self MET for L hip flexion, lower trunk rotation. Quadratus right stretches    Consulted and Agree with Plan of Care  Patient       Patient will benefit from skilled therapeutic intervention in order to improve the following deficits and impairments:     Visit Diagnosis: Chronic left-sided low back pain, with sciatica presence unspecified  Pain in left hip  Muscle spasm of back  Radiculopathy, lumbosacral region  Stiffness of right hip, not elsewhere classified     Problem List Patient Active Problem List   Diagnosis Date Noted  . DOE (dyspnea on exertion) 09/27/2017  . Bunion of great toe of right foot 07/14/2017  . Claw toe, acquired, right 07/14/2017  . 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  . Medication management 10/09/2016  . Plantar fasciitis of right foot 02/19/2015  . Depression 08/14/2014  . Family history of heart disease in male family member before age 60 04/26/2014  . Mild cognitive impairment 03/13/2014  . 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 Hyperlipidemia with target LDL less than 100   . Abdominal aortic  atherosclerosis ([place])   . Moderate essential hypertension 02/25/2011  . Arthropathy 02/25/2011  . HAV (hallux abducto valgus) 02/25/2011    Kaede Clendenen  PTA 01/05/2018, 11:42 AM  [facility] [address] [place], [place], [address] Phone: [phone]   Fax:  [phone]  Name: ZIDANE RENNER MRN: [id number] Date of Birth: Dec 19, 1950

## 2018-01-06 ENCOUNTER — Other Ambulatory Visit: Payer: Self-pay

## 2018-01-06 DIAGNOSIS — M48062 Spinal stenosis, lumbar region with neurogenic claudication: Secondary | ICD-10-CM

## 2018-01-06 MED FILL — ALFUZOSIN HCL ER 10 MG TAB: 10 | 90 days supply | Qty: 90 | Fill #1

## 2018-01-07 ENCOUNTER — Other Ambulatory Visit: Payer: Self-pay | Admitting: Physician Assistant

## 2018-01-07 DIAGNOSIS — M48062 Spinal stenosis, lumbar region with neurogenic claudication: Secondary | ICD-10-CM

## 2018-01-10 ENCOUNTER — Encounter: Payer: Self-pay | Admitting: Nurse Practitioner

## 2018-01-10 ENCOUNTER — Encounter: Payer: Self-pay | Admitting: Family Medicine

## 2018-01-10 ENCOUNTER — Telehealth: Payer: Self-pay | Admitting: Family Medicine

## 2018-01-10 NOTE — Telephone Encounter (Signed)
Pt's wife called and stated that she and mike are schedule to go out of the country on 01/18/2018. [name] is unable to go due to the pain that he is still in. He is schedule to receive a injection on 01/18/2018 so he is unable to go on trip. They have tried to move appt and can not get in any sooner. She is requesting a letter stating he is unable to travel so they can cancel trip without penalty. Please advise pt's wife at [phone] when letter is ready.

## 2018-01-10 NOTE — Telephone Encounter (Signed)
Take care of this 

## 2018-01-10 NOTE — Telephone Encounter (Signed)
Letter typed, called wife and she asked that it be faxed to [phone], faxed letter.

## 2018-01-10 NOTE — Telephone Encounter (Signed)
Set up already. Pt was aware that order was placed and he will need to be sen again. [place]

## 2018-01-12 ENCOUNTER — Other Ambulatory Visit: Payer: Self-pay | Admitting: Cardiology

## 2018-01-12 ENCOUNTER — Ambulatory Visit: Payer: 59 | Admitting: Physical Therapy

## 2018-01-12 ENCOUNTER — Other Ambulatory Visit (INDEPENDENT_AMBULATORY_CARE_PROVIDER_SITE_OTHER): Payer: Self-pay | Admitting: Family

## 2018-01-12 ENCOUNTER — Encounter: Payer: Self-pay | Admitting: Physical Therapy

## 2018-01-12 DIAGNOSIS — M545 Low back pain: Secondary | ICD-10-CM | POA: Diagnosis not present

## 2018-01-12 DIAGNOSIS — M25651 Stiffness of right hip, not elsewhere classified: Secondary | ICD-10-CM

## 2018-01-12 DIAGNOSIS — M62830 Muscle spasm of back: Secondary | ICD-10-CM

## 2018-01-12 DIAGNOSIS — M5417 Radiculopathy, lumbosacral region: Secondary | ICD-10-CM | POA: Diagnosis not present

## 2018-01-12 DIAGNOSIS — G8929 Other chronic pain: Secondary | ICD-10-CM

## 2018-01-12 DIAGNOSIS — M25552 Pain in left hip: Secondary | ICD-10-CM | POA: Diagnosis not present

## 2018-01-12 MED FILL — ALLOPURINOL 100 MG TABLET: 100 | 30 days supply | Qty: 60 | Fill #0

## 2018-01-12 MED FILL — IRBESARTAN 150 MG TABLET: 150 | 30 days supply | Qty: 30 | Fill #0

## 2018-01-12 NOTE — Telephone Encounter (Signed)
Rx request sent to pharmacy.  

## 2018-01-12 NOTE — Therapy (Signed)
[place], [place], [address] Phone: [phone]   Fax:  [phone]  Physical Therapy Treatment  Patient Details  Name: [name] MRN: [id number] Date of Birth: [birth year] Referring Provider: Jill Alexanders MD   Encounter Date: 01/12/2018  PT End of Session - 01/12/18 1317    Visit Number  4    Number of Visits  13    Date for PT Re-Evaluation  02/09/18    PT Start Time  1101    PT Stop Time  1200    PT Time Calculation (min)  59 min    Activity Tolerance  Patient tolerated treatment well    Behavior During Therapy  [facility] for tasks assessed/performed       Past Medical History:  Diagnosis Date  . Allergy   . Arthritis   . BPH (benign prostatic hyperplasia)   . Complication of anesthesia    Pt. stated he had a reaction that ended in him requiring urinary cath placement  . Diverticulosis   . GERD (gastroesophageal reflux disease)    esophageal spasms  . Glaucoma   . Gout   . Head injury, closed, with concussion   . Hepatitis C    chronic - Has been treated with Harvoni  . HLD (hyperlipidemia)    statin intolerant (Crestor & Simvastatin) - Taking Livalo 19m / week  . Hypertension   . Parkinson's disease ([facility]   . Plantar fasciitis    right  . PVD (peripheral vascular disease) ([place]    With no claudication; only mild abdominal aortic atherosclerosis noted on ultrasound.  . Thoracic ascending aortic aneurysm (HCC)    4.2 cm ascending TAA 09/2016 CT, 1 yr f/u rec  . Ulcer     Past Surgical History:  Procedure Laterality Date  . CARDIAC CATHETERIZATION  2005   30% Cx. Dr. GMelvern Banker . cataract surgery Left 11/05/2014  . COLONOSCOPY    . ESOPHAGOGASTRODUODENOSCOPY N/A 05/02/2015   Procedure: ESOPHAGOGASTRODUODENOSCOPY (EGD);  Surgeon: RRonald Lobo MD;  [facility];  Service: Endoscopy;  Laterality: N/A;  . ESOPHAGOGASTRODUODENOSCOPY  05/02/2015   no source of pt chest pain  endoscopically evident. small hiatal hernia.  .Marland KitchenEYE SURGERY    . MEMBRANE PEEL Left 03/14/2014   Procedure: MEMBRANE PEEL; ENDOLASER;  Surgeon: GHurman Horn MD;  [place] [facility]  Service: Ophthalmology;  Laterality: Left;  . NM MYOVIEW LTD  2011   Neg Ischemia or infarct.  .Marland KitchenNM MYOVIEW LTD  10/2015   LOW RISK. Small, fixed basal lateral defect - likely diaphragmatic attenuation. EF 69%  . PARS PLANA VITRECTOMY Left 03/14/2014   Procedure: PARS PLANA VITRECTOMY WITH 25 GAUGE;  Surgeon: GHurman Horn MD;  [place] [facility]  Service: Ophthalmology;  Laterality: Left;  . TONSILLECTOMY    . TRANSTHORACIC ECHOCARDIOGRAM  2013   Normal EF. No significant Valve Disease  . UPPER GASTROINTESTINAL ENDOSCOPY    . WEIL OSTEOTOMY Right 09/01/2017   Procedure: RIGHT GREAT TOE CHEVRON AND WEIL OSTEOTOMY 2ND METATARSAL;  Surgeon: DNewt Minion MD;  [place] [facility]  Service: Orthopedics;  Laterality: Right;    There were no vitals filed for this visit.  Subjective Assessment - 01/12/18 1104    Subjective  Pain increased after an hour or 2 after last vist.  By afternoon he was back to baseline.  Cannot lift full laundry basket,  groceries.  Handing wife's pocket book to her with left hand increases pain.  Sleeping a little better.      Currently in Pain?  Yes    Pain Score  5  after medication.     Pain Location  Hip    Pain Orientation  Left    Pain Descriptors / Indicators  Aching;Sore;Dull;Burning    Pain Radiating Towards  in thigh this morning    Aggravating Factors   walking    Pain Relieving Factors  some exercises ,  pain meds,  sitting sometimes helps,      Effect of Pain on Daily Activities  limits walking.  lifting,                         [facility] Adult PT Treatment/Exercise - 01/12/18 0001      Lumbar Exercises: Stretches   Passive Hamstring Stretch  3 reps;30 seconds    Single Knee to Chest Stretch  3 reps;10 seconds AA    Lower Trunk Rotation  5 reps;10 seconds cued  for pain free range due to end range pain    Hip Flexor Stretch  2 reps;20 seconds PROM in sidelying    Piriformis Stretch  3 reps;30 seconds also showed how to do sitting at work    Other Lumbar Stretch Exercise  quad stretch in sidelying passive.      Knee/Hip Exercises: Standing   Gait Training  heel lift trial in left shoe,  1 ply.  patient felt he was more level.      Modalities   Modalities  Moist Heat;Electrical Stimulation      Moist Heat Therapy   Number Minutes Moist Heat  15 Minutes    Moist Heat Location  Lumbar Spine Concurrent with IFC      Electrical Stimulation   Electrical Stimulation Location  gluteal low back    Electrical Stimulation Action  IFC    Electrical Stimulation Parameters  13    Electrical Stimulation Goals  Pain concurrent with Moist heat      Manual Therapy   Manual Therapy  Soft tissue mobilization    Manual therapy comments  lumbar and hip left anterior/lateral thigh.  foam roller,  Instrument assist and hands used for soft tissue work,  trigger point release mid gluteal  able to decrease radiating pain.  strumming QL and pSSIVE STRETCH TO SAME,  long axis distraction 3 X no change in symptoms             PT Education - 01/12/18 1316    Education Details  Heel lift  trial,  stretching technique,  IFC and how it works    [employer]) Educated  Patient    Methods  Explanation    Comprehension  Verbalized understanding       PT Short Term Goals - 01/12/18 1324      PT SHORT TERM GOAL #1   Title  pt to be I with initial HEP    Baseline  independent    Time  3    Period  Weeks    Status  Achieved      PT SHORT TERM GOAL #2   Title  pt to verbalize and demo proper posture and lifting mechanics to prevent and reduce low back pain     Baseline  not yet practicing    Time  3    Period  Weeks    Status  On-going        PT Long Term Goals - 12/29/17 1016  PT LONG TERM GOAL #1   Title  pt maintain functional trunk flexion/  extension and improve R side bending by >/= 8 degrees  with </= 1/10 pain for functional mobility required for ADLs     Time  6    Period  Weeks    Status  New    Target Date  02/09/18      PT LONG TERM GOAL #2   Title  pt to be able to sit, stand and walk for >/= 60 min reporting </= 1/10 pain for functional mobility/ endurance required for work related activities    Time  6    Period  Weeks    Status  New    Target Date  02/09/18      PT LONG TERM GOAL #3   Title  pt to increase FOTO score to </= 41% limited to demo improvement in function     Time  6    Period  Weeks    Status  New    Target Date  02/09/18      PT LONG TERM GOAL #4   Title  pt to be I with all HEP given as of last visit to maintain and progress current level of function    Time  6    Period  Weeks    Status  New    Target Date  02/09/18            Plan - 01/12/18 1318    Clinical Impression Statement  Patient willing to try anything to decrease pain to be able to do walking on vacation.  He is scheduled for injection next Tuesday.  Stretching focus today with trial of IFC and heel lift.  Gait is altered somewhat due to pain in right foot.  Able to centralize pain and decrease pain  with stretching.  posterior rotation also improved with stretching to almost level.  Pain increases with handing purse to wife.  Initial HEP goal met,  STG#![name]    PT Next Visit Plan   Assess hip posterior innominate rotation on the L, Assess heel lift Benifit?  IFC Benifit?  Lifting practice?  Posture  work. hamstring stretching, SLR, Progress stabilization exercises for home.     PT Home Exercise Plan  hamstring stretching, SLR, self MET for L hip flexion, lower trunk rotation. Quadratus right stretches    Consulted and Agree with Plan of Care  Patient       Patient will benefit from skilled therapeutic intervention in order to improve the following deficits and impairments:     Visit Diagnosis: Chronic left-sided low back  pain, with sciatica presence unspecified  Pain in left hip  Muscle spasm of back  Stiffness of right hip, not elsewhere classified  Radiculopathy, lumbosacral region     Problem List Patient Active Problem List   Diagnosis Date Noted  . DOE (dyspnea on exertion) 09/27/2017  . Bunion of great toe of right foot 07/14/2017  . Claw toe, acquired, right 07/14/2017  . 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  . Medication management 10/09/2016  . Plantar fasciitis of right foot 02/19/2015  . Depression 08/14/2014  . Family history of heart disease in male family member before age 22 04/26/2014  . Mild cognitive impairment 03/13/2014  . 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 Hyperlipidemia with target LDL less than 100   . Abdominal aortic atherosclerosis ([place])   . Moderate essential hypertension 02/25/2011  . Arthropathy 02/25/2011  .  HAV (hallux abducto valgus) 02/25/2011    Clint Biello  PTA 01/12/2018, 1:27 PM  [facility] [address] [place], [place], [address] Phone: [phone]   Fax:  [phone]  Name: JAREK LONGTON MRN: [id number] Date of Birth: [birth year]

## 2018-01-12 NOTE — Patient Instructions (Signed)
Trial heel lift. Wear 2 hours and remove if needed may need to wean into slowly.  If it increases pain, remove.

## 2018-01-14 ENCOUNTER — Ambulatory Visit: Payer: 59 | Admitting: Physical Therapy

## 2018-01-17 DIAGNOSIS — Z6826 Body mass index (BMI) 26.0-26.9, adult: Secondary | ICD-10-CM | POA: Diagnosis not present

## 2018-01-17 DIAGNOSIS — M48062 Spinal stenosis, lumbar region with neurogenic claudication: Secondary | ICD-10-CM | POA: Diagnosis not present

## 2018-01-18 ENCOUNTER — Ambulatory Visit
Admission: RE | Admit: 2018-01-18 | Discharge: 2018-01-18 | Disposition: A | Discharge status: Home / Self Care | Payer: 59 | Source: Ambulatory Visit | Attending: Physician Assistant | Admitting: Physician Assistant

## 2018-01-18 ENCOUNTER — Other Ambulatory Visit: Payer: Self-pay | Admitting: Physician Assistant

## 2018-01-18 DIAGNOSIS — M5126 Other intervertebral disc displacement, lumbar region: Secondary | ICD-10-CM | POA: Diagnosis not present

## 2018-01-18 DIAGNOSIS — M48062 Spinal stenosis, lumbar region with neurogenic claudication: Secondary | ICD-10-CM

## 2018-01-18 MED ORDER — METHYLPREDNISOLONE ACETATE 40 MG/ML INJ SUSP (RADIOLOG: 120.0000 mg | Freq: Once | INTRAMUSCULAR | Status: DC

## 2018-01-18 MED ORDER — IOPAMIDOL (ISOVUE-M 200) INJECTION 41%
1.0000 mL | Freq: Once | INTRAMUSCULAR | Status: AC
  Administered 2018-01-18: 1 mL via EPIDURAL

## 2018-01-18 NOTE — Discharge Instructions (Signed)
° °    Discharge Instructions  1. Go home and rest quietly for the next 24 hours.  It is important to lie flat for the next 24 hours.  Get up only to go to the restroom.  You may lie in the bed or on a couch on your back, your stomach, your left side or your right side.  You may have one pillow under your head.  You may have pillows between your knees while you are on your side or under your knees while you are on your back.  2. DO NOT drive today.  Recline the seat as far back as it will go, while still wearing your seat belt, on the way home.  3. You may get up to go to the bathroom as needed.  You may sit up for 10 minutes to eat.  You may resume your normal diet and medications unless otherwise indicated.  Drink lots of extra fluids today and tomorrow.  4. The incidence of headache, nausea, or vomiting is about 5% (one in 20 patients).  If you develop a headache, lie flat and drink plenty of fluids until the headache goes away.  Caffeinated beverages may be helpful.  If you develop severe nausea and vomiting or a headache that does not go away with flat bed rest, call the physician who sent you here.   5. You may resume normal activities after your 24 hours of bed rest is over; however, do not exert yourself strongly or do any heavy lifting tomorrow.  6. Call your physician for a follow-up appointment.   7. If you have any questions  after you arrive home, please call [phone].  Discharge instructions have been explained to the patient.  The patient, or the person responsible for the patient, fully understands these instructions.

## 2018-01-20 ENCOUNTER — Ambulatory Visit
Admission: RE | Admit: 2018-01-20 | Discharge: 2018-01-20 | Disposition: A | Discharge status: Home / Self Care | Payer: 59 | Source: Ambulatory Visit | Attending: Physician Assistant | Admitting: Physician Assistant

## 2018-01-20 ENCOUNTER — Other Ambulatory Visit: Payer: Self-pay | Admitting: Physician Assistant

## 2018-01-20 DIAGNOSIS — M48062 Spinal stenosis, lumbar region with neurogenic claudication: Secondary | ICD-10-CM

## 2018-01-20 DIAGNOSIS — M48061 Spinal stenosis, lumbar region without neurogenic claudication: Secondary | ICD-10-CM | POA: Diagnosis not present

## 2018-01-20 MED ORDER — IOPAMIDOL (ISOVUE-M 200) INJECTION 41%
1.0000 mL | Freq: Once | INTRAMUSCULAR | Status: AC
  Administered 2018-01-20: 1 mL via EPIDURAL

## 2018-01-20 MED ORDER — METHYLPREDNISOLONE ACETATE 40 MG/ML INJ SUSP (RADIOLOG
120.0000 mg | Freq: Once | INTRAMUSCULAR | Status: AC
  Administered 2018-01-20: 120 mg via EPIDURAL

## 2018-01-20 NOTE — Discharge Instructions (Signed)

## 2018-01-24 MED FILL — VERAPAMIL ER 240 MG TABLET: 240 | 90 days supply | Qty: 90 | Fill #1

## 2018-01-26 ENCOUNTER — Ambulatory Visit: Payer: 59 | Admitting: Physical Therapy

## 2018-01-31 ENCOUNTER — Ambulatory Visit (INDEPENDENT_AMBULATORY_CARE_PROVIDER_SITE_OTHER): Payer: 59 | Admitting: Orthopedic Surgery

## 2018-01-31 ENCOUNTER — Ambulatory Visit
Admission: RE | Admit: 2018-01-31 | Discharge: 2018-01-31 | Disposition: A | Discharge status: Home / Self Care | Payer: 59 | Source: Ambulatory Visit | Attending: Ophthalmology | Admitting: Ophthalmology

## 2018-01-31 DIAGNOSIS — H05241 Constant exophthalmos, right eye: Secondary | ICD-10-CM | POA: Diagnosis not present

## 2018-02-09 ENCOUNTER — Ambulatory Visit (INDEPENDENT_AMBULATORY_CARE_PROVIDER_SITE_OTHER): Payer: 59 | Admitting: Physical Medicine and Rehabilitation

## 2018-02-09 MED FILL — IRBESARTAN 150 MG TABLET: 150 | 30 days supply | Qty: 30 | Fill #1

## 2018-02-09 MED FILL — ALLOPURINOL 100 MG TABLET: 100 | 30 days supply | Qty: 60 | Fill #1

## 2018-02-10 ENCOUNTER — Encounter

## 2018-02-10 NOTE — Progress Notes (Deleted)
[facility] PATIENT: RODY KEADLE DOB: Mar 16, 1951   REASON FOR VISIT: Follow-up for tremor mild cognitive impairment, depression HISTORY FROM: Patient  HISTORY OF PRESENT ILLNESS:HISTORY:63 year African American male who since last year and a half has noticed increasing tremors mainly in the left arm and leg and to a lesser degree in the right arm as well. He states the tremors were quite mild but became more pronounced after a minor accident while staying [place] the rental [place] in [place]. He fell down 12 flights of steps and hit a concrete slab and had a concussion. He and some minor bruises and knee injury which took several months to reck of her period CT scan of the head was done 2 days later which was unremarkable. Since then he has had some walking difficulty but this may be related to his knee pain. He says that his feet do at times gets stuck in his started walking with a stooped posture. He however does not describe typical festination. He has resting and intermittent action tremor claiming the left arm and leg the tremor does improve with activities. The tremor does not seem to interfere with most of his routine. He denies significant bradykinesia, and drooling of saliva or micrographia. He has been evaluated by neurologists Dr. Reginia Forts at [facility] neurology but he is unable to tell me for the diagnosis was. He was not told that this may be Parkinson's and was not tried on dopaminergic medications. He did have an MRI scan and some lab work but I do not have those results for my review available today. Patient is here today for a second opinion as he feels his tremor is not getting better. He does admit to some light masonry hallucinations as well as restless sleep thrashing of his legs. He denies significant memory or cognitive difficulties. He has not noticed any particular effect of alcohol on his tremor. Is no family history of tremors. Patient does have  chronic hepatitis C and is planning to start treatment with dapsone. He has had no seizures, significant head injury with major loss of consciousness, stroke, TIAs or significant neurological problems.  Update 08/14/2014 : He returns for follow-up after last visit 3 months ago. He feels his tremors are about the same. He had tried reducing the dose of Sinemet but noticed that his tremors got worse and hence he went back up to the original dose which is 25/100 one tablet 3 times daily. He still feels occasionally confused and at times secondary to some cells but he admits that he worries a lot. He also admits to feeling depressed, getting tired easily and not having initiated. He has not been on any medications for depression. Patient denies significant drooling of saliva, bradykinesia, gait or balance problems. He feels his tremor is mild and does not interfere with his activities of daily living. I discussed alternative treatment options including addition of dopamine agonist, anticholinergic or even consideration for deep brain stimulation since his tremor is predominantly unilateral however the patient does not want to consider more aggressive treatment options at the present time. UPDATE 12/26/14 [name], 67 year old black male returns for follow-up. He was last seen by Dr. [name] 08/14/2014. He feels his tremors are better since increasing his carbidopa levodopa to 1-1/2 tablets 3 times daily. He is tolerating it well without dizziness, sleepiness, nightmares or hallucinations A MRI Brain with and without Contrast completed on 07/29/2011 was normal. The Mini-Mental status exam today is unchanged 29 out  of 30. He did not follow-up with his primary care for  complaints of depression. He denies any significant drooling bradykinesia falls or balance problems. His tremor does not interfere with his activities of daily living. He returns for reevaluation Update 07/03/2015 : He returns for follow-up after last  visit 6 months ago. He states his tremors continue to do well and he seems to have responded quite well to increased dose of Sinemet 25/100 1-1/2 tablet 3 times daily. Is tolerating it well without any dizziness, sleepiness, nightmares, hallucinations, nausea or diarrhea. He continues to have mild memory difficulties but his has not been on any treatment for depression and in fact has not seen his primary care physician for the same for quite some time. Update 08/11/2016 PS : He returns for follow-up after last visit year ago. He states his tremors as well as memory loss or more or less unchanged. Continues to have intermittent tremors in the left hand mainly. The tremors fluctuate he has good days and bad days. He is currently taking Sinemet 25/100 one and half tablet 3 times daily. He complains of mild fatigue and daytime sleepiness. He denies hallucinations, delusions, dizziness or upset stomach. He does not want to try higher dose because of fear of side effects. Continues to have short-term memory difficulties. He has not been quite compulsive about taking notes and using memory compensation strategies but plans to do so. He has no new complaints today. UPDATE 07/19/2018CM Mr. Niebuhr, 67 year old male returns for follow-up with tremors and mild cognitive impairment which he says is stable. He continues to have a resting tremor in the left hand which is intermittent. He continues to have good and bad days. He remains on Sinemet 25 100 (1 and half tablets) 3 times daily. He does little exercise he is not doing any memory compensation strategies at this time and was encouraged to do so. He denies any falls. He denies any hallucinations or increased confusion. He denies any freezing spells. He does complain of left hip pain He returns for reevaluation UPDATE 1/22/2019CM Mr. Flamenco, 9 he denies 7-year-old male returns for follow-up with a history of Parkinson's disease.  He continues to have mild resting tremor in  the left hand which is intermittent.  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  He remains on Sinemet 3 times a day however he is taking his medication with a meal.  He complains of sometimes being lightheaded when he stands up too quickly.  He was encouraged to sit on the side of the bed and then get up slightly.  He was also encouraged to stay well-hydrated.  He has not been exercising.  He denies any hallucinations or increased confusion.  He has not had any freezing spells.  He returns for reevaluation REVIEW OF SYSTEMS: Full 14 system review of systems performed and notable only for those listed, all others are neg:  Constitutional: neg  Cardiovascular: neg Ear/Nose/Throat: neg  Skin: neg Eyes: neg Respiratory: neg Gastroitestinal: Urinary frequency Hematology/Lymphatic: neg  Endocrine: neg Musculoskeletal: neg Allergy/Immunology: neg Neurological: Mild cognitive impairment Psychiatric: neg Sleep : neg   ALLERGIES: No Known Allergies  HOME MEDICATIONS: Outpatient Medications Prior to Visit  Medication Sig Dispense Refill  . alfuzosin (UROXATRAL) 10 MG 24 hr tablet Take 10 mg by mouth at bedtime.     [name] allopurinol (ZYLOPRIM) 100 MG tablet TAKE 1 TABLET (100 MG TOTAL) BY MOUTH 2 (TWO) TIMES DAILY. 60 tablet 2  . amoxicillin (AMOXIL) 875 MG  tablet Take 1 tablet (875 mg total) by mouth 2 (two) times daily. (Patient not taking: Reported on 12/29/2017) 20 tablet 0  . benzonatate (TESSALON) 200 MG capsule Take 1 capsule (200 mg total) by mouth 2 (two) times daily as needed for cough. (Patient not taking: Reported on 12/29/2017) 20 capsule 0  . carbidopa-levodopa (SINEMET IR) 25-100 MG tablet TAKE 1 AND 1/2 TABLET BY MOUTH 3 TIMES DAILY. 405 tablet 1  . diclofenac sodium (VOLTAREN) 1 % GEL Apply 2 g topically 4 (four) times daily. 100 g 0  . HYDROcodone-acetaminophen (NORCO/VICODIN) 5-325 MG tablet Take 1-2 tablets by mouth every 4 (four) hours as needed for moderate pain. 40 tablet 0   . ibuprofen (ADVIL,MOTRIN) 200 MG tablet Take 400 mg by mouth 2 (two) times daily as needed for headache or moderate pain.    [name] irbesartan (AVAPRO) 150 MG tablet Take 1 tablet (150 mg total) by mouth every evening. 30 tablet 6  . LIVALO 1 MG TABS TAKE 1 TABLET BY MOUTH 2 TIMES A WEEK 24 tablet 2  . Multiple Vitamin (MULTIVITAMIN WITH MINERALS) TABS tablet Take 1 tablet by mouth 4 (four) times a week.     . nitroGLYCERIN (NITROSTAT) 0.4 MG SL tablet PLACE 1 TABLET UNDER THE TONGUE EVERY 5 MINUTES AS NEEDED FOR CHEST PAIN. 25 tablet 6  . pantoprazole (PROTONIX) 40 MG tablet Take 40 mg by mouth daily as needed (acid reflux).   0  . timolol (TIMOPTIC) 0.5 % ophthalmic solution Place 1 drop into the left eye daily.     . TURMERIC PO Take 1,000 mg by mouth daily.    . verapamil (CALAN-SR) 240 MG CR tablet TAKE 1 TABLET (240 MG TOTAL) BY MOUTH DAILY. 90 tablet 3  . vitamin B-12 (CYANOCOBALAMIN) 1000 MCG tablet Take 1,000 mcg by mouth daily as needed (energy).     No facility-administered medications prior to visit.     PAST MEDICAL HISTORY: Past Medical History:  Diagnosis Date  . Allergy   . Arthritis   . BPH (benign prostatic hyperplasia)   . Complication of anesthesia    Pt. stated he had a reaction that ended in him requiring urinary cath placement  . Diverticulosis   . GERD (gastroesophageal reflux disease)    esophageal spasms  . Glaucoma   . Gout   . Head injury, closed, with concussion   . Hepatitis C    chronic - Has been treated with Harvoni  . HLD (hyperlipidemia)    statin intolerant (Crestor & Simvastatin) - Taking Livalo 1mg  / week  . Hypertension   . Parkinson's disease ([facility])   . Plantar fasciitis    right  . PVD (peripheral vascular disease) ([place] [facility])    With no claudication; only mild abdominal aortic atherosclerosis noted on ultrasound.  . Thoracic ascending aortic aneurysm ([facility])    4.2 cm ascending TAA 09/2016 CT, 1 yr f/u rec  . Ulcer     PAST SURGICAL  HISTORY: Past Surgical History:  Procedure Laterality Date  . CARDIAC CATHETERIZATION  2005   30% Cx. Dr. Melvern Banker  . cataract surgery Left 11/05/2014  . COLONOSCOPY    . ESOPHAGOGASTRODUODENOSCOPY N/A 05/02/2015   Procedure: ESOPHAGOGASTRODUODENOSCOPY (EGD);  Surgeon: Ronald Lobo, MD;  Location: [facility];  Service: Endoscopy;  Laterality: N/A;  . ESOPHAGOGASTRODUODENOSCOPY  05/02/2015   no source of pt chest pain endoscopically evident. small hiatal hernia.  [name] EYE SURGERY    . MEMBRANE PEEL Left 03/14/2014   Procedure: MEMBRANE PEEL;  ENDOLASER;  Surgeon: Hurman Horn, MD;  Location: [facility];  Service: Ophthalmology;  Laterality: Left;  . NM MYOVIEW LTD  2011   Neg Ischemia or infarct.  [name] NM MYOVIEW LTD  10/2015   LOW RISK. Small, fixed basal lateral defect - likely diaphragmatic attenuation. EF 69%  . PARS PLANA VITRECTOMY Left 03/14/2014   Procedure: PARS PLANA VITRECTOMY WITH 25 GAUGE;  Surgeon: Hurman Horn, MD;  Location: [facility];  Service: Ophthalmology;  Laterality: Left;  . TONSILLECTOMY    . TRANSTHORACIC ECHOCARDIOGRAM  2013   Normal EF. No significant Valve Disease  . UPPER GASTROINTESTINAL ENDOSCOPY    . WEIL OSTEOTOMY Right 09/01/2017   Procedure: RIGHT GREAT TOE CHEVRON AND WEIL OSTEOTOMY 2ND METATARSAL;  Surgeon: Newt Minion, MD;  Location: [facility];  Service: Orthopedics;  Laterality: Right;    FAMILY HISTORY: Family History  Problem Relation Age of Onset  . Heart disease Mother   . Cancer Paternal Grandfather   . Cancer Sister     SOCIAL HISTORY: Social History   Socioeconomic History  . Marital status: Married    Spouse name: barbra gen  . Number of children: 3  . Years of education: AS  . Highest education level: Not on file  Occupational History  . Occupation: self employed    Fish farm manager: [employer]  Social Needs  . Financial resource strain: Not on file  . Food insecurity:    Worry: Not on file    Inability: Not on file  . Transportation needs:     Medical: Not on file    Non-medical: Not on file  Tobacco Use  . Smoking status: Former Smoker    Types: Cigars    Last attempt to quit: 07/27/1988    Years since quitting: 29.5  . Smokeless tobacco: Never Used  Substance and Sexual Activity  . Alcohol use: Yes    Alcohol/week: 1.8 oz    Types: 1 Glasses of wine, 1 Cans of beer, 1 Shots of liquor per week    Comment: occasional  . Drug use: No  . Sexual activity: Yes  Lifestyle  . Physical activity:    Days per week: Not on file    Minutes per session: Not on file  . Stress: Not on file  Relationships  . Social connections:    Talks on phone: Not on file    Gets together: Not on file    Attends religious service: Not on file    Active member of club or organization: Not on file    Attends meetings of clubs or organizations: Not on file    Relationship status: Not on file  . Intimate partner violence:    Fear of current or ex partner: Not on file    Emotionally abused: Not on file    Physically abused: Not on file    Forced sexual activity: Not on file  Other Topics Concern  . 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     Wife is retired Therapist, sports from [employer].   He walks roughly 2-3 miles in bed time at least 2-3 times a week. He quit smoking 30 years ago.     PHYSICAL EXAM  There were no vitals filed for this visit. There is no height or weight on file to calculate BMI.  Generalized: Well developed, in no acute distress Negative myersons  sign Head: normocephalic and atraumatic,. Oropharynx  benign  Neck: Supple, no carotid bruits  Cardiac: Regular rate rhythm, no murmur  Musculoskeletal: No deformity   Neurological examination   Mentation: Alert oriented to time, place, history taking. Attention span and concentration appropriate.   Follows all commands speech and language fluent.   Cranial nerve II-XII: Pupils were equal round reactive to  light extraocular movements were full, visual field were full on confrontational test. Facial sensation and strength were normal. hearing was intact to finger rubbing bilaterally. Uvula tongue midline. head turning and shoulder shrug were normal and symmetric.Tongue protrusion into cheek strength was normal. Motor: normal bulk and tone, full strength in the BUE, BLE, fine finger movements normal, no bradykinesia, intermittent left hand resting tremor, cogwheeling at the left wrist and elbow  Sensory: normal and symmetric to light touch, on the face arms and legs  Coordination: finger-nose-finger, heel-to-shin bilaterally, no dysmetria Reflexes: 1+ upper lower and symmetric plantar responses were flexor bilaterally. Gait and Station: Rising up from seated position without assistance, ambulated 210 feet in the hall  moderate stride, decreased  arm swing bilaterally, smooth turning,  DIAGNOSTIC DATA (LABS, IMAGING, TESTING) - I reviewed patient records, labs, notes, testing and imaging myself where available.  Lab Results  Component Value Date   WBC 4.3 09/01/2017   HGB 13.8 09/01/2017   HCT 41.7 09/01/2017   MCV 92.5 09/01/2017   PLT 249 09/01/2017      Component Value Date/Time   NA 137 09/29/2017 0927   K 4.5 09/29/2017 0927   CL 100 09/29/2017 0927   CO2 24 09/29/2017 0927   GLUCOSE 82 09/29/2017 0927   GLUCOSE 91 09/01/2017 0718   BUN 15 09/29/2017 0927   CREATININE 1.02 09/29/2017 0927   CREATININE 1.06 06/29/2014 0920   CALCIUM 9.3 09/29/2017 0927   PROT 7.6 09/29/2017 0927   ALBUMIN 4.5 09/29/2017 0927   AST 14 09/29/2017 0927   ALT 5 09/29/2017 0927   ALKPHOS 78 09/29/2017 0927   BILITOT 0.5 09/29/2017 0927   GFRNONAA 76 09/29/2017 0927   GFRAA 88 09/29/2017 0927   Lab Results  Component Value Date   CHOL 144 09/29/2017   HDL 46 09/29/2017   LDLCALC 89 09/29/2017   TRIG 47 09/29/2017   CHOLHDL 3.1 09/29/2017     Lab Results  Component Value Date   TSH 1.950  09/28/2016    ASSESSMENT AND PLAN 67 y.o. year old male  has a medical history of 3 year history of resting left arm and leg tremors with mild features of early left sided Parkinson's disease. Good response to Sinemet. He also complains of mild cognitive impairment which appears stable.The patient is a current patient of Dr. [name] who is out of the office today . This note is sent to the work in doctor.     Continue carbidopa/levodopa at current dose  At 8am 1 pm and 6pm will refill Get into a routine exercise program Do mentally challenging activities like crossword puzzles and playing bridge or solitare Follow-up in 6 months  I spent 25 minutes in total face to face time with the patient more than 50% of which was spent counseling and coordination of care, reviewing test results reviewing medications and discussing and reviewing the diagnosis of Parkinson's disease and mild cognitive impairment and strategies for both, he was also given some written information on Parkinson's disease and this was reviewed with him. Dennie Bible, [facility], [facility], APRN  [facility] [address] [place], [place]  [address] [phone] 

## 2018-02-11 ENCOUNTER — Ambulatory Visit: Payer: 59 | Attending: Family Medicine | Admitting: Physical Therapy

## 2018-02-11 ENCOUNTER — Encounter: Payer: Self-pay | Admitting: Physical Therapy

## 2018-02-11 DIAGNOSIS — M545 Low back pain: Secondary | ICD-10-CM | POA: Diagnosis not present

## 2018-02-11 DIAGNOSIS — M25651 Stiffness of right hip, not elsewhere classified: Secondary | ICD-10-CM | POA: Diagnosis not present

## 2018-02-11 DIAGNOSIS — M62830 Muscle spasm of back: Secondary | ICD-10-CM | POA: Diagnosis not present

## 2018-02-11 DIAGNOSIS — M25552 Pain in left hip: Secondary | ICD-10-CM | POA: Diagnosis not present

## 2018-02-11 DIAGNOSIS — G8929 Other chronic pain: Secondary | ICD-10-CM | POA: Insufficient documentation

## 2018-02-11 DIAGNOSIS — M5417 Radiculopathy, lumbosacral region: Secondary | ICD-10-CM | POA: Insufficient documentation

## 2018-02-11 NOTE — Therapy (Addendum)
[place], [place], [address] Phone: [phone]   Fax:  [phone]  Physical Therapy Treatment/Re-certification  Patient Details  Name: [name] MRN: [id number] Date of Birth: 21-Jul-1951 Referring Provider: Jill Alexanders MD   Encounter Date: 02/11/2018  PT End of Session - 02/11/18 1052    Visit Number  5    Number of Visits  13    Date for PT Re-Evaluation  03/11/18    Authorization Type  [facility] [employer]    PT Start Time  0804    PT Stop Time  0854    PT Time Calculation (min)  50 min    Activity Tolerance  Patient tolerated treatment well    Behavior During Therapy  [facility] for tasks assessed/performed       Past Medical History:  Diagnosis Date  . Allergy   . Arthritis   . BPH (benign prostatic hyperplasia)   . Complication of anesthesia    Pt. stated he had a reaction that ended in him requiring urinary cath placement  . Diverticulosis   . GERD (gastroesophageal reflux disease)    esophageal spasms  . Glaucoma   . Gout   . Head injury, closed, with concussion   . Hepatitis C    chronic - Has been treated with Harvoni  . HLD (hyperlipidemia)    statin intolerant (Crestor & Simvastatin) - Taking Livalo 79m / week  . Hypertension   . Parkinson's disease ([place]   . Plantar fasciitis    right  . PVD (peripheral vascular disease) ([place]    With no claudication; only mild abdominal aortic atherosclerosis noted on ultrasound.  . Thoracic ascending aortic aneurysm (HCC)    4.2 cm ascending TAA 09/2016 CT, 1 yr f/u rec  . Ulcer     Past Surgical History:  Procedure Laterality Date  . CARDIAC CATHETERIZATION  2005   30% Cx. Dr. GMelvern Banker . cataract surgery Left 11/05/2014  . COLONOSCOPY    . ESOPHAGOGASTRODUODENOSCOPY N/A 05/02/2015   Procedure: ESOPHAGOGASTRODUODENOSCOPY (EGD);  Surgeon: RRonald Lobo MD;  [facility];  Service: Endoscopy;  Laterality: N/A;  . ESOPHAGOGASTRODUODENOSCOPY   05/02/2015   no source of pt chest pain endoscopically evident. small hiatal hernia.  .Marland KitchenEYE SURGERY    . MEMBRANE PEEL Left 03/14/2014   Procedure: MEMBRANE PEEL; ENDOLASER;  Surgeon: GHurman Horn MD;  [facility]  Service: Ophthalmology;  Laterality: Left;  . NM MYOVIEW LTD  2011   Neg Ischemia or infarct.  .Marland KitchenNM MYOVIEW LTD  10/2015   LOW RISK. Small, fixed basal lateral defect - likely diaphragmatic attenuation. EF 69%  . PARS PLANA VITRECTOMY Left 03/14/2014   Procedure: PARS PLANA VITRECTOMY WITH 25 GAUGE;  Surgeon: GHurman Horn MD;  [facility]  Service: Ophthalmology;  Laterality: Left;  . TONSILLECTOMY    . TRANSTHORACIC ECHOCARDIOGRAM  2013   Normal EF. No significant Valve Disease  . UPPER GASTROINTESTINAL ENDOSCOPY    . WEIL OSTEOTOMY Right 09/01/2017   Procedure: RIGHT GREAT TOE CHEVRON AND WEIL OSTEOTOMY 2ND METATARSAL;  Surgeon: DNewt Minion MD;  [facility]  Service: Orthopedics;  Laterality: Right;    There were no vitals filed for this visit.  Subjective Assessment - 02/11/18 0810    Subjective  "I feel pretty good. I still have some discomfort. I had an injection and that seemed to help. The IFC and heel lift did not seem to help the pain."  Currently in Pain?  Yes    Pain Score  2     Pain Location  Back    Pain Orientation  Left;Lower    Pain Descriptors / Indicators  Discomfort    Pain Type  Chronic pain    Pain Radiating Towards  in the thigh but not as bad as before.     Pain Onset  More than a month ago         [facility] PT Assessment - 02/11/18 0001      Observation/Other Assessments   Focus on Therapeutic Outcomes (FOTO)   45% limited      AROM   Lumbar Flexion  75    Lumbar Extension  24    Lumbar - Right Side Bend  22    Lumbar - Left Side Bend  22      Palpation   Palpation comment  L PSIS inferior; L ASIS superior; greater trochanters even      Gaenslen's test   Findings  Negative    Side   Left    Comments  negative  hamstring flexion test on L, negative thigh thrust on L; positive forward flexion test (R more mobile than L)                   [facility] Adult PT Treatment/Exercise - 02/11/18 0001      Lumbar Exercises: Stretches   Prone Mid Back Stretch  2 reps;30 seconds childs pose in center and hands to R      Lumbar Exercises: Supine   Other Supine Lumbar Exercises  lower trunk rotations x 10 reps      Moist Heat Therapy   Number Minutes Moist Heat  10 Minutes    Moist Heat Location  Lumbar Spine      Manual Therapy   Joint Mobilization  long axis distraction L hip; grade 5 manip    Muscle Energy Technique  5 x 10 sec hold L hip flexor             PT Education - 02/11/18 1051    Education Details  education on SI and pelvic anatomy, long axis distraction and grade 5 manipulation of L hip, MET, HEP    Person(s) Educated  Patient    Methods  Explanation;Handout    Comprehension  Verbalized understanding       PT Short Term Goals - 01/12/18 1324      PT SHORT TERM GOAL #1   Title  pt to be I with initial HEP    Baseline  independent    Time  3    Period  Weeks    Status  Achieved      PT SHORT TERM GOAL #2   Title  pt to verbalize and demo proper posture and lifting mechanics to prevent and reduce low back pain     Baseline  not yet practicing    Time  3    Period  Weeks    Status  On-going        PT Long Term Goals - 02/11/18 1056      PT LONG TERM GOAL #1   Title  pt maintain functional trunk flexion/ extension and improve R side bending by >/= 8 degrees  with </= 1/10 pain for functional mobility required for ADLs     Baseline  22 degrees right sidebending    Status  Achieved      PT LONG TERM GOAL #2  Title  pt to be able to sit, stand and walk for >/= 60 min reporting </= 1/10 pain for functional mobility/ endurance required for work related activities    Status  On-going      PT [place] #3   Title  pt to increase FOTO score to </= 41% limited  to demo improvement in function     Baseline  45% limited    Status  On-going      PT LONG TERM GOAL #4   Title  pt to be I with all HEP given as of last visit to maintain and progress current level of function    Status  On-going            Plan - 02/11/18 1053    Clinical Impression Statement  Reassessment performed today. Pt demonstrates increased lumbar AROM in all directions. Pt progressing towards achievement of LTGs. He has achieved his LTG of increasing R sidebending by >/= 8 degrees (achieved 22 degrees R sidebending). Also assessed L SI joint. Positive forward flexion test on L. Palpation of ASIS and PSIS reveals posteriorly rotated L innominate. Long axis distraction of L hip and MET of L hip flexor performed. Pt would benefit from continued OPPT services to address pain, ROM, strength, and functional mobility.     Rehab Potential  Good    PT Frequency  2x / week    PT Duration  4 weeks    PT Treatment/Interventions  ADLs/Self Care Home Management;Cryotherapy;Electrical Stimulation;Iontophoresis 77m/ml Dexamethasone;Moist Heat;Ultrasound;Traction;Gait training;Stair training;Patient/family education;Passive range of motion;Dry needling;Manual techniques;Therapeutic exercise;Therapeutic activities;Taping;Balance training    PT Next Visit Plan   Assess hip posterior innominate rotation on the L,  Lifting practice?  Posture  work. hamstring stretching, SLR, Progress stabilization exercises for home.     PT Home Exercise Plan  hamstring stretching, SLR, self MET for L hip flexion, lower trunk rotation. Quadratus right stretches, childs pose stretch    Consulted and Agree with Plan of Care  Patient       Patient will benefit from skilled therapeutic intervention in order to improve the following deficits and impairments:  Pain, Abnormal gait, Decreased strength, Difficulty walking, Postural dysfunction, Improper body mechanics, Decreased range of motion, Increased fascial  restricitons  Visit Diagnosis: Chronic left-sided low back pain, with sciatica presence unspecified  Pain in left hip  Muscle spasm of back  Stiffness of right hip, not elsewhere classified     Problem List Patient Active Problem List   Diagnosis Date Noted  . DOE (dyspnea on exertion) 09/27/2017  . Bunion of great toe of right foot 07/14/2017  . Claw toe, acquired, right 07/14/2017  . 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  . Medication management 10/09/2016  . Plantar fasciitis of right foot 02/19/2015  . Depression 08/14/2014  . Family history of heart disease in male family member before age 443010/07/2013  . Mild cognitive impairment 03/13/2014  . Parkinsonian tremor ([place] 02/12/2014  . Hyperlipidemia with target LDL less than 100   . Abdominal aortic atherosclerosis ([place]   . Moderate essential hypertension 02/25/2011  . Arthropathy 02/25/2011  . HAV (hallux abducto valgus) 02/25/2011   MWorthy Flank SPT 02/11/18 11:03 AM   [facility]GAlder[address] [place] [address]Phone: [phone]  Fax:  [phone] Name: MELIEZER KHAWAJAMRN: [id number] of Birth: [birth year]

## 2018-02-14 ENCOUNTER — Encounter: Payer: Self-pay | Admitting: Physical Therapy

## 2018-02-14 ENCOUNTER — Ambulatory Visit: Payer: 59 | Admitting: Nurse Practitioner

## 2018-02-14 ENCOUNTER — Telehealth: Payer: Self-pay | Admitting: *Deleted

## 2018-02-14 ENCOUNTER — Ambulatory Visit: Payer: 59 | Admitting: Physical Therapy

## 2018-02-14 DIAGNOSIS — M25552 Pain in left hip: Secondary | ICD-10-CM | POA: Diagnosis not present

## 2018-02-14 DIAGNOSIS — M25651 Stiffness of right hip, not elsewhere classified: Secondary | ICD-10-CM | POA: Diagnosis not present

## 2018-02-14 DIAGNOSIS — M62830 Muscle spasm of back: Secondary | ICD-10-CM

## 2018-02-14 DIAGNOSIS — M5417 Radiculopathy, lumbosacral region: Secondary | ICD-10-CM | POA: Diagnosis not present

## 2018-02-14 DIAGNOSIS — M545 Low back pain: Secondary | ICD-10-CM | POA: Diagnosis not present

## 2018-02-14 DIAGNOSIS — G8929 Other chronic pain: Secondary | ICD-10-CM

## 2018-02-14 NOTE — Therapy (Signed)
[place], [place], [address] Phone: [phone]   Fax:  [phone]  Physical Therapy Treatment  Patient Details  Name: [name] MRN: [id number] Date of Birth: 11/24/1950 Referring Provider: Jill Alexanders MD   Encounter Date: 02/14/2018  PT End of Session - 02/14/18 1319    Visit Number  6    Number of Visits  13    Date for PT Re-Evaluation  03/11/18    PT Start Time  1105    PT Stop Time  1200    PT Time Calculation (min)  55 min    Activity Tolerance  Patient tolerated treatment well    Behavior During Therapy  [facility] for tasks assessed/performed       Past Medical History:  Diagnosis Date  . Allergy   . Arthritis   . BPH (benign prostatic hyperplasia)   . Complication of anesthesia    Pt. stated he had a reaction that ended in him requiring urinary cath placement  . Diverticulosis   . GERD (gastroesophageal reflux disease)    esophageal spasms  . Glaucoma   . Gout   . Head injury, closed, with concussion   . Hepatitis C    chronic - Has been treated with Harvoni  . HLD (hyperlipidemia)    statin intolerant (Crestor & Simvastatin) - Taking Livalo 3m / week  . Hypertension   . Parkinson's disease ([place]   . Plantar fasciitis    right  . PVD (peripheral vascular disease) ([place]    With no claudication; only mild abdominal aortic atherosclerosis noted on ultrasound.  . Thoracic ascending aortic aneurysm (HCC)    4.2 cm ascending TAA 09/2016 CT, 1 yr f/u rec  . Ulcer     Past Surgical History:  Procedure Laterality Date  . CARDIAC CATHETERIZATION  2005   30% Cx. Dr. GMelvern Banker . cataract surgery Left 11/05/2014  . COLONOSCOPY    . ESOPHAGOGASTRODUODENOSCOPY N/A 05/02/2015   Procedure: ESOPHAGOGASTRODUODENOSCOPY (EGD);  Surgeon: RRonald Lobo MD;  [facility];  Service: Endoscopy;  Laterality: N/A;  . ESOPHAGOGASTRODUODENOSCOPY  05/02/2015   no source of pt chest pain  endoscopically evident. small hiatal hernia.  .Marland KitchenEYE SURGERY    . MEMBRANE PEEL Left 03/14/2014   Procedure: MEMBRANE PEEL; ENDOLASER;  Surgeon: GHurman Horn MD;  [facility]  Service: Ophthalmology;  Laterality: Left;  . NM MYOVIEW LTD  2011   Neg Ischemia or infarct.  .Marland KitchenNM MYOVIEW LTD  10/2015   LOW RISK. Small, fixed basal lateral defect - likely diaphragmatic attenuation. EF 69%  . PARS PLANA VITRECTOMY Left 03/14/2014   Procedure: PARS PLANA VITRECTOMY WITH 25 GAUGE;  Surgeon: GHurman Horn MD;  [facility] [facility]  Service: Ophthalmology;  Laterality: Left;  . TONSILLECTOMY    . TRANSTHORACIC ECHOCARDIOGRAM  2013   Normal EF. No significant Valve Disease  . UPPER GASTROINTESTINAL ENDOSCOPY    . WEIL OSTEOTOMY Right 09/01/2017   Procedure: RIGHT GREAT TOE CHEVRON AND WEIL OSTEOTOMY 2ND METATARSAL;  Surgeon: DNewt Minion MD;  Location: [facility]  Service: Orthopedics;  Laterality: Right;    There were no vitals filed for this visit.  Subjective Assessment - 02/14/18 1111    Subjective  3/10.  I am getting a little better.  I have been doing the new exercises.  Not in thigh right now.      Currently in Pain?  Yes    Pain Score  3     Pain Location  Back    Pain Orientation  Left;Lower    Pain Descriptors / Indicators  Discomfort    Pain Type  Chronic pain    Pain Radiating Towards  not right now    Aggravating Factors   walking    Pain Relieving Factors  injection in 1st week in JULY    Multiple Pain Sites  -- foot pain i doing OK  longstanding                       [facility] Adult PT Treatment/Exercise - 02/14/18 0001      Therapeutic Activites    Therapeutic Activities  Lifting    Lifting  bolster and 5 LB kettle bell from mat , from floor  good technque  felt pop in  right low back       Lumbar Exercises: Stretches   Passive Hamstring Stretch  3 reps;30 seconds    Other Lumbar Stretch Exercise  child's pose and quadratus lumborum stretch      Lumbar  Exercises: Machines for Strengthening   Leg Press  1, 2, 3 plates X 10 each Tried both machines,      Other Lumbar Machine Exercise  sitting row 20, 25, 30 LBS 10 X each    Other Lumbar Machine Exercise  hip flexion 2 plates 10  pop with right hip flexion  no pain with lowering      Lumbar Exercises: Sidelying   Clam  20 reps    Other Sidelying Lumbar Exercises  PNF  trunk AA to decrease tightness low back      Knee/Hip Exercises: Aerobic   Recumbent Bike  L4 X 5 minutes      Knee/Hip Exercises: Supine   Straight Leg Raises  10 reps;2 sets      Modalities   Modalities  Moist Heat      Moist Heat Therapy   Number Minutes Moist Heat  15 Minutes    Moist Heat Location  Lumbar Spine             PT Education - 02/14/18 1319    Education Details  Gym machine use technique    Person(s) Educated  Patient    Methods  Explanation;Tactile cues;Verbal cues    Comprehension  Returned demonstration;Verbalized understanding       PT Short Term Goals - 01/12/18 1324      PT SHORT TERM GOAL #1   Title  pt to be I with initial HEP    Baseline  independent    Time  3    Period  Weeks    Status  Achieved      PT SHORT TERM GOAL #2   Title  pt to verbalize and demo proper posture and lifting mechanics to prevent and reduce low back pain     Baseline  not yet practicing    Time  3    Period  Weeks    Status  On-going        PT Long Term Goals - 02/11/18 1056      PT LONG TERM GOAL #1   Title  pt maintain functional trunk flexion/ extension and improve R side bending by >/= 8 degrees  with </= 1/10 pain for functional mobility required for ADLs     Baseline  22 degrees right sidebending    Status  Achieved      PT LONG TERM GOAL #2  Title  pt to be able to sit, stand and walk for >/= 60 min reporting </= 1/10 pain for functional mobility/ endurance required for work related activities    Status  On-going      PT [place] #3   Title  pt to increase FOTO score to  </= 41% limited to demo improvement in function     Baseline  45% limited    Status  On-going      PT LONG TERM GOAL #4   Title  pt to be I with all HEP given as of last visit to maintain and progress current level of function    Status  On-going            Plan - 02/14/18 1320    Clinical Impression Statement  Patient has 3/10 pain today with no radiation.  he wanted to progress to some gym equipment.  he was able to do without pain.  he noted pops in right low back with standing hip flexion .  ASIS slightly rotated  more level with exercise.  patient able to demo correct technique with lifting.  Pop noted right low back upun lift without pain.     PT Next Visit Plan   Assess hip posterior innominate rotation on the L,  Lifting practice?  Posture  work. hamstring stretching, SLR, Progress stabilization exercises for home. ( gym)    PT Home Exercise Plan  hamstring stretching, SLR, self MET for L hip flexion, lower trunk rotation. Quadratus right stretches, childs pose stretch    Consulted and Agree with Plan of Care  Patient       Patient will benefit from skilled therapeutic intervention in order to improve the following deficits and impairments:     Visit Diagnosis: Chronic left-sided low back pain, with sciatica presence unspecified  Pain in left hip  Muscle spasm of back  Stiffness of right hip, not elsewhere classified     Problem List Patient Active Problem List   Diagnosis Date Noted  . DOE (dyspnea on exertion) 09/27/2017  . Bunion of great toe of right foot 07/14/2017  . Claw toe, acquired, right 07/14/2017  . 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  . Medication management 10/09/2016  . Plantar fasciitis of right foot 02/19/2015  . Depression 08/14/2014  . Family history of heart disease in male family member before age 49 04/26/2014  . Mild cognitive impairment 03/13/2014  . Parkinsonian tremor ([place]) 02/12/2014  .  Hyperlipidemia with target LDL less than 100   . Abdominal aortic atherosclerosis ([place])   . Moderate essential hypertension 02/25/2011  . Arthropathy 02/25/2011  . HAV (hallux abducto valgus) 02/25/2011    HARRIS,KAREN PTA 02/14/2018, 1:24 PM  [facility] [address] [place], [place], [address] Phone: [phone]   Fax:  [phone]  Name: JASPREET HOLLINGS MRN: [id number] Date of Birth: 07/06/1951

## 2018-02-14 NOTE — Telephone Encounter (Signed)
Spoke with patient and apologized for call. Advised him that NP is out of office today and need to reschedule him. Advised him C Martin's  availability is into this fall., and asked if he would agree to see Janett Billow, NP one time. He stated he would see Janett Billow, rescheduled him for this Wed. He understands to arrive 30 minutes early to check in. He would like to go back to seeing Daun Peacock NP, and this RN advised he would be able to do that. He verbalized understanding, appreciation.

## 2018-02-15 NOTE — Progress Notes (Signed)
[facility] PATIENT: [name] DOB: Mar 16, 1951   REASON FOR VISIT: Follow-up for tremor mild cognitive impairment, depression HISTORY FROM: Patient  HISTORY OF PRESENT ILLNESS:HISTORY:63 year African American male who since last year and a half has noticed increasing tremors mainly in the left arm and leg and to a lesser degree in the right arm as well. He states the tremors were quite mild but became more pronounced after a minor accident while staying [place] the rental [place] in [place]. He fell down 12 flights of steps and hit a concrete slab and had a concussion. He and some minor bruises and knee injury which took several months to reck of her period CT scan of the head was done 2 days later which was unremarkable. Since then he has had some walking difficulty but this may be related to his knee pain. He says that his feet do at times gets stuck in his started walking with a stooped posture. He however does not describe typical festination. He has resting and intermittent action tremor claiming the left arm and leg the tremor does improve with activities. The tremor does not seem to interfere with most of his routine. He denies significant bradykinesia, and drooling of saliva or micrographia. He has been evaluated by neurologists Dr. Reginia Forts at [facility] neurology but he is unable to tell me for the diagnosis was. He was not told that this may be Parkinson's and was not tried on dopaminergic medications. He did have an MRI scan and some lab work but I do not have those results for my review available today. Patient is here today for a second opinion as he feels his tremor is not getting better. He does admit to some light masonry hallucinations as well as restless sleep thrashing of his legs. He denies significant memory or cognitive difficulties. He has not noticed any particular effect of alcohol on his tremor. Is no family history of tremors. Patient does have  chronic hepatitis C and is planning to start treatment with dapsone. He has had no seizures, significant head injury with major loss of consciousness, stroke, TIAs or significant neurological problems.  Update 08/14/2014 : He returns for follow-up after last visit 3 months ago. He feels his tremors are about the same. He had tried reducing the dose of Sinemet but noticed that his tremors got worse and hence he went back up to the original dose which is 25/100 one tablet 3 times daily. He still feels occasionally confused and at times secondary to some cells but he admits that he worries a lot. He also admits to feeling depressed, getting tired easily and not having initiated. He has not been on any medications for depression. Patient denies significant drooling of saliva, bradykinesia, gait or balance problems. He feels his tremor is mild and does not interfere with his activities of daily living. I discussed alternative treatment options including addition of dopamine agonist, anticholinergic or even consideration for deep brain stimulation since his tremor is predominantly unilateral however the patient does not want to consider more aggressive treatment options at the present time. UPDATE 12/26/14 [name], 67 year old black male returns for follow-up. He was last seen by Dr. [name] 08/14/2014. He feels his tremors are better since increasing his carbidopa levodopa to 1-1/2 tablets 3 times daily. He is tolerating it well without dizziness, sleepiness, nightmares or hallucinations A MRI Brain with and without Contrast completed on 07/29/2011 was normal. The Mini-Mental status exam today is unchanged 29 out  of 30. He did not follow-up with his primary care for  complaints of depression. He denies any significant drooling bradykinesia falls or balance problems. His tremor does not interfere with his activities of daily living. He returns for reevaluation Update 07/03/2015 : He returns for follow-up after last  visit 6 months ago. He states his tremors continue to do well and he seems to have responded quite well to increased dose of Sinemet 25/100 1-1/2 tablet 3 times daily. Is tolerating it well without any dizziness, sleepiness, nightmares, hallucinations, nausea or diarrhea. He continues to have mild memory difficulties but his has not been on any treatment for depression and in fact has not seen his primary care physician for the same for quite some time. Update 08/11/2016 PS : He returns for follow-up after last visit year ago. He states his tremors as well as memory loss or more or less unchanged. Continues to have intermittent tremors in the left hand mainly. The tremors fluctuate he has good days and bad days. He is currently taking Sinemet 25/100 one and half tablet 3 times daily. He complains of mild fatigue and daytime sleepiness. He denies hallucinations, delusions, dizziness or upset stomach. He does not want to try higher dose because of fear of side effects. Continues to have short-term memory difficulties. He has not been quite compulsive about taking notes and using memory compensation strategies but plans to do so. He has no new complaints today. UPDATE 07/19/2018CM [name], 67 year old male returns for follow-up with tremors and mild cognitive impairment which he says is stable. He continues to have a resting tremor in the left hand which is intermittent. He continues to have good and bad days. He remains on Sinemet 25 100 (1 and half tablets) 3 times daily. He does little exercise he is not doing any memory compensation strategies at this time and was encouraged to do so. He denies any falls. He denies any hallucinations or increased confusion. He denies any freezing spells. He does complain of left hip pain He returns for reevaluation UPDATE 1/22/2019CM [name], 42 he denies 28-year-old male returns for follow-up with a history of Parkinson's disease.  He continues to have mild resting tremor in  the left hand which is intermittent.  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  He remains on Sinemet 3 times a day however he is taking his medication with a meal.  He complains of sometimes being lightheaded when he stands up too quickly.  He was encouraged to sit on the side of the bed and then get up slightly.  He was also encouraged to stay well-hydrated.  He has not been exercising.  He denies any hallucinations or increased confusion.  He has not had any freezing spells.  He returns for reevaluation  02/15/18 UPDATE: Patient returns today for six-month follow-up.  He continues to take Sinemet 3 times daily with the first dose around 6 AM (1 hour prior to eating breakfast), second dose 11 AM (1 hour prior to eating lunch) and then third dose between 5:30 and 6 PM (eats dinner around 6:30-7 PM).  Patient denies any increase in symptoms between doses but does state if he is late on a dose, he will start having increased tremors in his left upper extremity and left lower extremity.  He continues to complain of short-term memory issue and is unable to state whether it is worsening but he states he notices it more.  He did obtain a large book full  avoid games but does this approximately 1 time per week.  Recommended to do these activities at least once daily.  Patient also has complaints of feeling as though he overanalyzes certain situations and feeling increased fatigue.  Patient does endorse increased stress recently but does feel somewhat situational.  Provided patient with stress relaxation techniques and advised him if this continues, to follow-up with PCP for possible medication management.  He denies any hallucinations or increased confusion.  He denies any freezing spells.   REVIEW OF SYSTEMS: Full 14 system review of systems performed and notable only for those listed, all others are neg:  Constitutional: neg  Cardiovascular: neg Ear/Nose/Throat: Eye itching Skin: neg Eyes:  neg Respiratory: neg Gastroitestinal: Negative Hematology/Lymphatic: neg  Endocrine: neg Musculoskeletal: Back pain Allergy/Immunology: neg Neurological: Memory loss Psychiatric: neg Sleep : neg   ALLERGIES: No Known Allergies  HOME MEDICATIONS: Outpatient Medications Prior to Visit  Medication Sig Dispense Refill  . alfuzosin (UROXATRAL) 10 MG 24 hr tablet Take 10 mg by mouth at bedtime.     [name] allopurinol (ZYLOPRIM) 100 MG tablet TAKE 1 TABLET (100 MG TOTAL) BY MOUTH 2 (TWO) TIMES DAILY. 60 tablet 2  . benzonatate (TESSALON) 200 MG capsule Take 1 capsule (200 mg total) by mouth 2 (two) times daily as needed for cough. 20 capsule 0  . diclofenac sodium (VOLTAREN) 1 % GEL Apply 2 g topically 4 (four) times daily. 100 g 0  . ibuprofen (ADVIL,MOTRIN) 200 MG tablet Take 400 mg by mouth 2 (two) times daily as needed for headache or moderate pain.    [name] irbesartan (AVAPRO) 150 MG tablet Take 1 tablet (150 mg total) by mouth every evening. 30 tablet 6  . LIVALO 1 MG TABS TAKE 1 TABLET BY MOUTH 2 TIMES A WEEK 24 tablet 2  . Multiple Vitamin (MULTIVITAMIN WITH MINERALS) TABS tablet Take 1 tablet by mouth 4 (four) times a week.     . nitroGLYCERIN (NITROSTAT) 0.4 MG SL tablet PLACE 1 TABLET UNDER THE TONGUE EVERY 5 MINUTES AS NEEDED FOR CHEST PAIN. 25 tablet 6  . pantoprazole (PROTONIX) 40 MG tablet Take 40 mg by mouth daily as needed (acid reflux).   0  . timolol (TIMOPTIC) 0.5 % ophthalmic solution Place 1 drop into the left eye daily.     . TURMERIC PO Take 1,000 mg by mouth daily.    . verapamil (CALAN-SR) 240 MG CR tablet TAKE 1 TABLET (240 MG TOTAL) BY MOUTH DAILY. 90 tablet 3  . vitamin B-12 (CYANOCOBALAMIN) 1000 MCG tablet Take 1,000 mcg by mouth daily as needed (energy).    . carbidopa-levodopa (SINEMET IR) 25-100 MG tablet TAKE 1 AND 1/2 TABLET BY MOUTH 3 TIMES DAILY. 405 tablet 1  . amoxicillin (AMOXIL) 875 MG tablet Take 1 tablet (875 mg total) by mouth 2 (two) times daily. (Patient  not taking: Reported on 02/16/2018) 20 tablet 0  . HYDROcodone-acetaminophen (NORCO/VICODIN) 5-325 MG tablet Take 1-2 tablets by mouth every 4 (four) hours as needed for moderate pain. (Patient not taking: Reported on 02/16/2018) 40 tablet 0   No facility-administered medications prior to visit.     PAST MEDICAL HISTORY: Past Medical History:  Diagnosis Date  . Allergy   . Arthritis   . BPH (benign prostatic hyperplasia)   . Complication of anesthesia    Pt. stated he had a reaction that ended in him requiring urinary cath placement  . Diverticulosis   . GERD (gastroesophageal reflux disease)    esophageal spasms  .  Glaucoma   . Gout   . Head injury, closed, with concussion   . Hepatitis C    chronic - Has been treated with Harvoni  . HLD (hyperlipidemia)    statin intolerant (Crestor & Simvastatin) - Taking Livalo 1mg  / week  . Hypertension   . Parkinson's disease ([place])   . Plantar fasciitis    right  . PVD (peripheral vascular disease) ([place])    With no claudication; only mild abdominal aortic atherosclerosis noted on ultrasound.  . Thoracic ascending aortic aneurysm ([facility])    4.2 cm ascending TAA 09/2016 CT, 1 yr f/u rec  . Ulcer     PAST SURGICAL HISTORY: Past Surgical History:  Procedure Laterality Date  . CARDIAC CATHETERIZATION  2005   30% Cx. Dr. Melvern Banker  . cataract surgery Left 11/05/2014  . COLONOSCOPY    . ESOPHAGOGASTRODUODENOSCOPY N/A 05/02/2015   Procedure: ESOPHAGOGASTRODUODENOSCOPY (EGD);  Surgeon: Ronald Lobo, MD;  Location: [facility];  Service: Endoscopy;  Laterality: N/A;  . ESOPHAGOGASTRODUODENOSCOPY  05/02/2015   no source of pt chest pain endoscopically evident. small hiatal hernia.  [name] EYE SURGERY    . MEMBRANE PEEL Left 03/14/2014   Procedure: MEMBRANE PEEL; ENDOLASER;  Surgeon: Hurman Horn, MD;  Location: [facility];  Service: Ophthalmology;  Laterality: Left;  . NM MYOVIEW LTD  2011   Neg Ischemia or infarct.  [name] NM MYOVIEW LTD  10/2015   LOW  RISK. Small, fixed basal lateral defect - likely diaphragmatic attenuation. EF 69%  . PARS PLANA VITRECTOMY Left 03/14/2014   Procedure: PARS PLANA VITRECTOMY WITH 25 GAUGE;  Surgeon: Hurman Horn, MD;  Location: [facility];  Service: Ophthalmology;  Laterality: Left;  . TONSILLECTOMY    . TRANSTHORACIC ECHOCARDIOGRAM  2013   Normal EF. No significant Valve Disease  . UPPER GASTROINTESTINAL ENDOSCOPY    . WEIL OSTEOTOMY Right 09/01/2017   Procedure: RIGHT GREAT TOE CHEVRON AND WEIL OSTEOTOMY 2ND METATARSAL;  Surgeon: Newt Minion, MD;  Location: [facility];  Service: Orthopedics;  Laterality: Right;    FAMILY HISTORY: Family History  Problem Relation Age of Onset  . Heart disease Mother   . Cancer Paternal Grandfather   . Cancer Sister     SOCIAL HISTORY: Social History   Socioeconomic History  . Marital status: Married    Spouse name: barbra gen  . Number of children: 3  . Years of education: AS  . Highest education level: Not on file  Occupational History  . Occupation: self employed    Fish farm manager: [employer]  Social Needs  . Financial resource strain: Not on file  . Food insecurity:    Worry: Not on file    Inability: Not on file  . Transportation needs:    Medical: Not on file    Non-medical: Not on file  Tobacco Use  . Smoking status: Former Smoker    Types: Cigars    Last attempt to quit: 07/27/1988    Years since quitting: 29.5  . Smokeless tobacco: Never Used  Substance and Sexual Activity  . Alcohol use: Yes    Alcohol/week: 1.8 oz    Types: 1 Glasses of wine, 1 Cans of beer, 1 Shots of liquor per week    Comment: occasional  . Drug use: No  . Sexual activity: Yes  Lifestyle  . Physical activity:    Days per week: Not on file    Minutes per session: Not on file  . Stress: Not on file  Relationships  .  Social connections:    Talks on phone: Not on file    Gets together: Not on file    Attends religious service: Not on file    Active member of club or  organization: Not on file    Attends meetings of clubs or organizations: Not on file    Relationship status: Not on file  . Intimate partner violence:    Fear of current or ex partner: Not on file    Emotionally abused: Not on file    Physically abused: Not on file    Forced sexual activity: Not on file  Other Topics Concern  . 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     Wife is retired Therapist, sports from [employer].   He walks roughly 2-3 miles in bed time at least 2-3 times a week. He quit smoking 30 years ago.     PHYSICAL EXAM  Vitals:   02/16/18 0813  BP: 94/60  Weight: 168 lb (76.2 kg)  Height: 5\' 8"  (1.727 m)   Body mass index is 25.54 kg/m.  Generalized: Well developed, pleasant elderly African-American male, in no acute distress Negative myersons  sign Head: normocephalic and atraumatic,. Oropharynx benign  Neck: Supple, no carotid bruits  Cardiac: Regular rate rhythm, no murmur  Musculoskeletal: No deformity   Neurological examination   Mentation: Alert oriented to time, place, history taking. Attention span and concentration appropriate.   Follows all commands speech and language fluent.   Cranial nerve II-XII: Pupils were equal round reactive to light extraocular movements were full, visual field were full on confrontational test. Facial sensation and strength were normal. hearing was intact to finger rubbing bilaterally. Uvula tongue midline. head turning and shoulder shrug were normal and symmetric.Tongue protrusion into cheek strength was normal. Motor: normal bulk and tone, full strength in the BUE, BLE, fine finger movements normal, no bradykinesia, mild intermittent left hand resting tremor, cogwheeling at the left wrist and elbow > right wrist Sensory: normal and symmetric to light touch, on the face arms and legs  Coordination: finger-nose-finger, heel-to-shin bilaterally, no  dysmetria Reflexes: 1+ upper lower and symmetric plantar responses were flexor bilaterally. Gait and Station: Rising up from seated position without assistance, ambulated without difficulty with normal stride steps, decreased  arm swing bilaterally, smooth turning, able to tandem gait without difficulty   DIAGNOSTIC DATA (LABS, IMAGING, TESTING) - I reviewed patient records, labs, notes, testing and imaging myself where available.  Lab Results  Component Value Date   WBC 4.3 09/01/2017   HGB 13.8 09/01/2017   HCT 41.7 09/01/2017   MCV 92.5 09/01/2017   PLT 249 09/01/2017      Component Value Date/Time   NA 137 09/29/2017 0927   K 4.5 09/29/2017 0927   CL 100 09/29/2017 0927   CO2 24 09/29/2017 0927   GLUCOSE 82 09/29/2017 0927   GLUCOSE 91 09/01/2017 0718   BUN 15 09/29/2017 0927   CREATININE 1.02 09/29/2017 0927   CREATININE 1.06 06/29/2014 0920   CALCIUM 9.3 09/29/2017 0927   PROT 7.6 09/29/2017 0927   ALBUMIN 4.5 09/29/2017 0927   AST 14 09/29/2017 0927   ALT 5 09/29/2017 0927   ALKPHOS 78 09/29/2017 0927   BILITOT 0.5 09/29/2017 0927   GFRNONAA 76 09/29/2017 0927   GFRAA 88 09/29/2017 0927   Lab Results  Component Value Date   CHOL 144 09/29/2017   HDL 46 09/29/2017  LDLCALC 89 09/29/2017   TRIG 47 09/29/2017   CHOLHDL 3.1 09/29/2017     Lab Results  Component Value Date   TSH 1.950 09/28/2016    ASSESSMENT AND PLAN 67 y.o. year old male  has a medical history of 3 year history of resting left arm and leg tremors with mild features of early left sided Parkinson's disease. Good response to Sinemet. He also complains of mild cognitive impairment which appears stable.patient returns today for follow-up appointment and overall has been stable on current dose of Sinemet for his Parkinson's disease.   Continue carbidopa/levodopa at current dose  Advised to stay active and eat a healthy diet Do mentally challenging activities like crossword puzzles and playing  bridge or solitare at least daily Stress relaxation techniques and follow-up with PCP in 1 to 2 months if not resolved  Follow-up in 6 months with Carolyne, NP  I spent 25 minutes in total face to face time with the patient more than 50% of which was spent counseling and coordination of care, reviewing test results reviewing medications and discussing and reviewing the diagnosis of Parkinson's disease and mild cognitive impairment and strategies for both, he was also given some written information on Parkinson's disease and this was reviewed with him.  Venancio Poisson, AGNP-BC  [facility] [address] [place], [place] [address]  Phone [phone] Fax [phone] Note: This document was prepared with digital dictation and possible smart phrase technology. Any transcriptional errors that result from this process are unintentional.

## 2018-02-16 ENCOUNTER — Ambulatory Visit: Payer: 59 | Admitting: Adult Health

## 2018-02-16 ENCOUNTER — Encounter: Payer: Self-pay | Admitting: Physical Therapy

## 2018-02-16 ENCOUNTER — Ambulatory Visit: Payer: 59 | Admitting: Physical Therapy

## 2018-02-16 ENCOUNTER — Encounter: Payer: Self-pay | Admitting: Adult Health

## 2018-02-16 VITALS — BP 94/60 | Ht 68.0 in | Wt 168.0 lb

## 2018-02-16 DIAGNOSIS — M62830 Muscle spasm of back: Secondary | ICD-10-CM

## 2018-02-16 DIAGNOSIS — G8929 Other chronic pain: Secondary | ICD-10-CM | POA: Diagnosis not present

## 2018-02-16 DIAGNOSIS — G20 Parkinson's disease: Secondary | ICD-10-CM | POA: Diagnosis not present

## 2018-02-16 DIAGNOSIS — H35372 Puckering of macula, left eye: Secondary | ICD-10-CM | POA: Diagnosis not present

## 2018-02-16 DIAGNOSIS — M25651 Stiffness of right hip, not elsewhere classified: Secondary | ICD-10-CM

## 2018-02-16 DIAGNOSIS — G3184 Mild cognitive impairment, so stated: Secondary | ICD-10-CM | POA: Diagnosis not present

## 2018-02-16 DIAGNOSIS — H35352 Cystoid macular degeneration, left eye: Secondary | ICD-10-CM | POA: Diagnosis not present

## 2018-02-16 DIAGNOSIS — H4720 Unspecified optic atrophy: Secondary | ICD-10-CM | POA: Diagnosis not present

## 2018-02-16 DIAGNOSIS — M25552 Pain in left hip: Secondary | ICD-10-CM | POA: Diagnosis not present

## 2018-02-16 DIAGNOSIS — H34812 Central retinal vein occlusion, left eye, with macular edema: Secondary | ICD-10-CM | POA: Diagnosis not present

## 2018-02-16 DIAGNOSIS — M5417 Radiculopathy, lumbosacral region: Secondary | ICD-10-CM

## 2018-02-16 DIAGNOSIS — M545 Low back pain: Principal | ICD-10-CM

## 2018-02-16 MED ORDER — CARBIDOPA-LEVODOPA 25-100 MG PO TABS: ORAL_TABLET | ORAL | 1 refills | Status: DC

## 2018-02-16 MED FILL — CARBIDOPA/LEVO 25/100 TAB: 25-100 | 90 days supply | Qty: 405 | Fill #0

## 2018-02-16 NOTE — Therapy (Signed)
[place], [place], [address] Phone: [phone]   Fax:  [phone]  Physical Therapy Treatment  Patient Details  Name: [name] MRN: [id number] Date of Birth: [birth year] Referring Provider: Jill Alexanders MD   Encounter Date: 02/16/2018  PT End of Session - 02/16/18 0857    Visit Number  7    Number of Visits  13    Date for PT Re-Evaluation  03/11/18    Authorization Type  [facility]    PT Start Time  0851    PT Stop Time  0940    PT Time Calculation (min)  49 min    Activity Tolerance  Patient tolerated treatment well    Behavior During Therapy  [facility] for tasks assessed/performed       Past Medical History:  Diagnosis Date  . Allergy   . Arthritis   . BPH (benign prostatic hyperplasia)   . Complication of anesthesia    Pt. stated he had a reaction that ended in him requiring urinary cath placement  . Diverticulosis   . GERD (gastroesophageal reflux disease)    esophageal spasms  . Glaucoma   . Gout   . Head injury, closed, with concussion   . Hepatitis C    chronic - Has been treated with Harvoni  . HLD (hyperlipidemia)    statin intolerant (Crestor & Simvastatin) - Taking Livalo 41m / week  . Hypertension   . Parkinson's disease ([place]   . Plantar fasciitis    right  . PVD (peripheral vascular disease) ([place]    With no claudication; only mild abdominal aortic atherosclerosis noted on ultrasound.  . Thoracic ascending aortic aneurysm (HCC)    4.2 cm ascending TAA 09/2016 CT, 1 yr f/u rec  . Ulcer     Past Surgical History:  Procedure Laterality Date  . CARDIAC CATHETERIZATION  2005   30% Cx. Dr. GMelvern Banker . cataract surgery Left 11/05/2014  . COLONOSCOPY    . ESOPHAGOGASTRODUODENOSCOPY N/A 05/02/2015   Procedure: ESOPHAGOGASTRODUODENOSCOPY (EGD);  Surgeon: RRonald Lobo MD;  [facility];  Service: Endoscopy;  Laterality: N/A;  . ESOPHAGOGASTRODUODENOSCOPY  05/02/2015   no source of pt chest pain endoscopically evident. small hiatal hernia.  .Marland KitchenEYE SURGERY    . MEMBRANE PEEL Left 03/14/2014   Procedure: MEMBRANE PEEL; ENDOLASER;  Surgeon: GHurman Horn MD;  [facility]  Service: Ophthalmology;  Laterality: Left;  . NM MYOVIEW LTD  2011   Neg Ischemia or infarct.  .Marland KitchenNM MYOVIEW LTD  10/2015   LOW RISK. Small, fixed basal lateral defect - likely diaphragmatic attenuation. EF 69%  . PARS PLANA VITRECTOMY Left 03/14/2014   Procedure: PARS PLANA VITRECTOMY WITH 25 GAUGE;  Surgeon: GHurman Horn MD;  [facility]  Service: Ophthalmology;  Laterality: Left;  . TONSILLECTOMY    . TRANSTHORACIC ECHOCARDIOGRAM  2013   Normal EF. No significant Valve Disease  . UPPER GASTROINTESTINAL ENDOSCOPY    . WEIL OSTEOTOMY Right 09/01/2017   Procedure: RIGHT GREAT TOE CHEVRON AND WEIL OSTEOTOMY 2ND METATARSAL;  Surgeon: DNewt Minion MD;  [facility]  Service: Orthopedics;  Laterality: Right;    There were no vitals filed for this visit.  Subjective Assessment - 02/16/18 0855    Subjective  Pt arrving to therapy 6 minutes late reporting 3/10 low back pain. Pt reporting doing his exercises at home.     How long can you sit comfortably?  can  vary depending ont he time of day, 30 min     How long can you stand comfortably?  depends on pain but avg 30 min    How long can you walk comfortably?  depends on pain but avg 30 min    Patient Stated Goals  decrease pain, return to activity,see if any other treatments are necessary.     Currently in Pain?  Yes    Pain Score  3     Pain Location  Back    Pain Orientation  Lower;Left    Pain Descriptors / Indicators  Aching    Pain Type  Chronic pain    Pain Frequency  Intermittent                       [facility] Adult PT Treatment/Exercise - 02/16/18 0001      Lumbar Exercises: Stretches   Passive Hamstring Stretch  3 reps;30 seconds    Other Lumbar Stretch Exercise  quad stretch with standing lunge  position      Lumbar Exercises: Machines for Strengthening   Leg Press  3 plates X 10, 3 sets Tried both machines,        Lumbar Exercises: Sidelying   Clam  15 reps;5 seconds;Limitations    Clam Limitations  green theraband      Knee/Hip Exercises: Aerobic   Nustep  L5 x 6 minutes      Knee/Hip Exercises: Standing   Other Standing Knee Exercises  sit to stand x 15, squats working on posture       Knee/Hip Exercises: Supine   Straight Leg Raises  2 sets;10 reps;Limitations    Straight Leg Raises Limitations  3# weight on second set      Modalities   Modalities  Moist Heat      Moist Heat Therapy   Number Minutes Moist Heat  10 Minutes    Moist Heat Location  Lumbar Spine             PT Education - 02/16/18 0856    Education Details  reviewed techniques with exercises and posture    Person(s) Educated  Patient    Methods  Explanation;Demonstration    Comprehension  Verbalized understanding;Returned demonstration       PT Short Term Goals - 02/16/18 0859      PT SHORT TERM GOAL #1   Title  pt to be I with initial HEP    Baseline  independent    Time  3    Period  Weeks    Status  Achieved      PT SHORT TERM GOAL #2   Title  pt to verbalize and demo proper posture and lifting mechanics to prevent and reduce low back pain     Baseline  not yet practicing    Time  3    Period  Weeks    Status  On-going        PT Long Term Goals - 02/16/18 0859      PT LONG TERM GOAL #1   Title  pt maintain functional trunk flexion/ extension and improve R side bending by >/= 8 degrees  with </= 1/10 pain for functional mobility required for ADLs     Baseline  22 degrees right sidebending    Time  6    Period  Weeks    Status  Achieved      PT LONG TERM GOAL #2   Title  pt to  be able to sit, stand and walk for >/= 60 min reporting </= 1/10 pain for functional mobility/ endurance required for work related activities    Baseline  may do DGI goal as Merrilee Jansky was [facility]      Time  6    Period  Weeks    Status  On-going      PT LONG TERM GOAL #3   Title  pt to increase FOTO score to </= 41% limited to demo improvement in function     Baseline  45% limited    Time  6    Period  Weeks    Status  On-going      PT LONG TERM GOAL #4   Title  pt to be I with all HEP given as of last visit to maintain and progress current level of function    Baseline  hamstrings WNL    Period  Weeks    Status  On-going      PT LONG TERM GOAL #5   Title  FOTO score will improve to <40% limited to demo improvement.     Time  6    Period  Weeks    Status  On-going            Plan - 02/16/18 0857    Clinical Impression Statement  Pt tolerating all exericses well today reviewed technique with posture and each exercise. Pt reporting less pain with lifting today and less pain at end of session. Continue with skilled PT.     Rehab Potential  Good    PT Frequency  2x / week    PT Duration  4 weeks    PT Treatment/Interventions  ADLs/Self Care Home Management;Cryotherapy;Electrical Stimulation;Iontophoresis 70m/ml Dexamethasone;Moist Heat;Ultrasound;Traction;Gait training;Stair training;Patient/family education;Passive range of motion;Dry needling;Manual techniques;Therapeutic exercise;Therapeutic activities;Taping;Balance training    PT Next Visit Plan   Assess hip posterior innominate rotation on the L,  Lifting practice?  Posture  work. hamstring stretching, SLR, Progress stabilization exercises for home. ( gym)    PT Home Exercise Plan  hamstring stretching, SLR, self MET for L hip flexion, lower trunk rotation. Quadratus right stretches, childs pose stretch    Consulted and Agree with Plan of Care  Patient       Patient will benefit from skilled therapeutic intervention in order to improve the following deficits and impairments:  Pain, Abnormal gait, Decreased strength, Difficulty walking, Postural dysfunction, Improper body mechanics, Decreased range of motion, Increased  fascial restricitons  Visit Diagnosis: Chronic left-sided low back pain, with sciatica presence unspecified  Pain in left hip  Muscle spasm of back  Stiffness of right hip, not elsewhere classified  Radiculopathy, lumbosacral region     Problem List Patient Active Problem List   Diagnosis Date Noted  . DOE (dyspnea on exertion) 09/27/2017  . Bunion of great toe of right foot 07/14/2017  . Claw toe, acquired, right 07/14/2017  . 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  . Medication management 10/09/2016  . Plantar fasciitis of right foot 02/19/2015  . Depression 08/14/2014  . Family history of heart disease in male family member before age 486210/07/2013  . Mild cognitive impairment 03/13/2014  . Parkinsonian tremor ([facility] 02/12/2014  . Hyperlipidemia with target LDL less than 100   . Abdominal aortic atherosclerosis ([facility]   . Moderate essential hypertension 02/25/2011  . Arthropathy 02/25/2011  . HAV (hallux abducto valgus) 02/25/2011    JOretha Caprice MPT 02/16/2018, 9:32 AM  [place]  [facility] [address] [place], [place], [address] Phone: [phone]   Fax:  [phone]  Name: DURK CARMEN MRN: [id number] Date of Birth: 03/12/1951

## 2018-02-16 NOTE — Patient Instructions (Signed)
Your Plan:  Continue sinemet 3 times daily  Do mind exercises at least once a day Continue to stay active and eat healthy Do stress relaxation techniques  Follow up with Hoyle Sauer, NP in 6 months     Thank you for coming to see [place] at [facility]. I hope we have been able to provide you high quality care today.  You may receive a patient satisfaction survey over the next few weeks. We would appreciate your feedback and comments so that we may continue to improve ourselves and the health of our patients.     Stress and Stress Management Stress is a normal reaction to life events. It is what you feel when life demands more than you are used to or more than you can handle. Some stress can be useful. For example, the stress reaction can help you catch the last bus of the day, study for a test, or meet a deadline at work. But stress that occurs too often or for too long can cause problems. It can affect your emotional health and interfere with relationships and normal daily activities. Too much stress can weaken your immune system and increase your risk for physical illness. If you already have a medical problem, stress can make it worse. What are the causes? All sorts of life events may cause stress. An event that causes stress for one person may not be stressful for another person. Major life events commonly cause stress. These may be positive or negative. Examples include losing your job, moving into a new home, getting married, having a baby, or losing a loved one. Less obvious life events may also cause stress, especially if they occur day after day or in combination. Examples include working long hours, driving in traffic, caring for children, being in debt, or being in a difficult relationship. What are the signs or symptoms? Stress may cause emotional symptoms including, the following:  Anxiety. This is feeling worried, afraid, on edge, overwhelmed, or out of  control.  Anger. This is feeling irritated or impatient.  Depression. This is feeling sad, down, helpless, or guilty.  Difficulty focusing, remembering, or making decisions.  Stress may cause physical symptoms, including the following:  Aches and pains. These may affect your head, neck, back, stomach, or other areas of your body.  Tight muscles or clenched jaw.  Low energy or trouble sleeping.  Stress may cause unhealthy behaviors, including the following:  Eating to feel better (overeating) or skipping meals.  Sleeping too little, too much, or both.  Working too much or putting off tasks (procrastination).  Smoking, drinking alcohol, or using drugs to feel better.  How is this diagnosed? Stress is diagnosed through an assessment by your health care provider. Your health care provider will ask questions about your symptoms and any stressful life events.Your health care provider will also ask about your medical history and may order blood tests or other tests. Certain medical conditions and medicine can cause physical symptoms similar to stress. Mental illness can cause emotional symptoms and unhealthy behaviors similar to stress. Your health care provider may refer you to a mental health professional for further evaluation. How is this treated? Stress management is the recommended treatment for stress.The goals of stress management are reducing stressful life events and coping with stress in healthy ways. Techniques for reducing stressful life events include the following:  Stress identification. Self-monitor for stress and identify what causes stress for you. These skills may help you to avoid some  stressful events.  Time management. Set your priorities, keep a calendar of events, and learn to say "no." These tools can help you avoid making too many commitments.  Techniques for coping with stress include the following:  Rethinking the problem. Try to think realistically about  stressful events rather than ignoring them or overreacting. Try to find the positives in a stressful situation rather than focusing on the negatives.  Exercise. Physical exercise can release both physical and emotional tension. The key is to find a form of exercise you enjoy and do it regularly.  Relaxation techniques. These relax the body and mind. Examples include yoga, meditation, tai chi, biofeedback, deep breathing, progressive muscle relaxation, listening to music, being out in nature, journaling, and other hobbies. Again, the key is to find one or more that you enjoy and can do regularly.  Healthy lifestyle. Eat a balanced diet, get plenty of sleep, and do not smoke. Avoid using alcohol or drugs to relax.  Strong support network. Spend time with family, friends, or other people you enjoy being around.Express your feelings and talk things over with someone you trust.  Counseling or talktherapy with a mental health professional may be helpful if you are having difficulty managing stress on your own. Medicine is typically not recommended for the treatment of stress.Talk to your health care provider if you think you need medicine for symptoms of stress. Follow these instructions at home:  Keep all follow-up visits as directed by your health care provider.  Take all medicines as directed by your health care provider. Contact a health care provider if:  Your symptoms get worse or you start having new symptoms.  You feel overwhelmed by your problems and can no longer manage them on your own. Get help right away if:  You feel like hurting yourself or someone else. This information is not intended to replace advice given to you by your health care provider. Make sure you discuss any questions you have with your health care provider. Document Released: 01/06/2001 Document Revised: 12/19/2015 Document Reviewed: 03/07/2013 Elsevier Interactive Patient Education  2017 [employer].

## 2018-02-17 NOTE — Progress Notes (Signed)
I agree with the above plan 

## 2018-02-25 ENCOUNTER — Encounter

## 2018-02-28 MED FILL — LIVALO 1 MG TABLET: 1 | 84 days supply | Qty: 24 | Fill #1

## 2018-03-02 ENCOUNTER — Ambulatory Visit: Payer: 59 | Attending: Family Medicine | Admitting: Physical Therapy

## 2018-03-02 ENCOUNTER — Encounter: Payer: Self-pay | Admitting: Physical Therapy

## 2018-03-02 DIAGNOSIS — M545 Low back pain: Secondary | ICD-10-CM | POA: Insufficient documentation

## 2018-03-02 DIAGNOSIS — M25552 Pain in left hip: Secondary | ICD-10-CM | POA: Diagnosis not present

## 2018-03-02 DIAGNOSIS — M62830 Muscle spasm of back: Secondary | ICD-10-CM | POA: Diagnosis not present

## 2018-03-02 DIAGNOSIS — G8929 Other chronic pain: Secondary | ICD-10-CM | POA: Insufficient documentation

## 2018-03-02 NOTE — Therapy (Addendum)
[place], [place], [address] Phone: [phone]   Fax:  [phone]  Physical Therapy Treatment  Patient Details  Name: [name] MRN: [id number] Date of Birth: 04-Oct-1950 Referring Provider: Jill Alexanders MD   Encounter Date: 03/02/2018  PT End of Session - 03/02/18 0951    Visit Number  8    Number of Visits  13    Date for PT Re-Evaluation  03/11/18    Authorization Type  [facility] UMR    PT Start Time  0930    PT Stop Time  1014    PT Time Calculation (min)  44 min    Activity Tolerance  Patient tolerated treatment well    Behavior During Therapy  [facility] for tasks assessed/performed       Past Medical History:  Diagnosis Date  . Allergy   . Arthritis   . BPH (benign prostatic hyperplasia)   . Complication of anesthesia    Pt. stated he had a reaction that ended in him requiring urinary cath placement  . Diverticulosis   . GERD (gastroesophageal reflux disease)    esophageal spasms  . Glaucoma   . Gout   . Head injury, closed, with concussion   . Hepatitis C    chronic - Has been treated with Harvoni  . HLD (hyperlipidemia)    statin intolerant (Crestor & Simvastatin) - Taking Livalo 103m / week  . Hypertension   . Parkinson's disease ([place]   . Plantar fasciitis    right  . PVD (peripheral vascular disease) ([facility]    With no claudication; only mild abdominal aortic atherosclerosis noted on ultrasound.  . Thoracic ascending aortic aneurysm (HCC)    4.2 cm ascending TAA 09/2016 CT, 1 yr f/u rec  . Ulcer     Past Surgical History:  Procedure Laterality Date  . CARDIAC CATHETERIZATION  2005   30% Cx. Dr. GMelvern Banker . cataract surgery Left 11/05/2014  . COLONOSCOPY    . ESOPHAGOGASTRODUODENOSCOPY N/A 05/02/2015   Procedure: ESOPHAGOGASTRODUODENOSCOPY (EGD);  Surgeon: RRonald Lobo MD;  [facility];  Service: Endoscopy;  Laterality: N/A;  . ESOPHAGOGASTRODUODENOSCOPY  05/02/2015   no  source of pt chest pain endoscopically evident. small hiatal hernia.  .Marland KitchenEYE SURGERY    . MEMBRANE PEEL Left 03/14/2014   Procedure: MEMBRANE PEEL; ENDOLASER;  Surgeon: GHurman Horn MD;  [facility]  Service: Ophthalmology;  Laterality: Left;  . NM MYOVIEW LTD  2011   Neg Ischemia or infarct.  .Marland KitchenNM MYOVIEW LTD  10/2015   LOW RISK. Small, fixed basal lateral defect - likely diaphragmatic attenuation. EF 69%  . PARS PLANA VITRECTOMY Left 03/14/2014   Procedure: PARS PLANA VITRECTOMY WITH 25 GAUGE;  Surgeon: GHurman Horn MD;  [facility]  Service: Ophthalmology;  Laterality: Left;  . TONSILLECTOMY    . TRANSTHORACIC ECHOCARDIOGRAM  2013   Normal EF. No significant Valve Disease  . UPPER GASTROINTESTINAL ENDOSCOPY    . WEIL OSTEOTOMY Right 09/01/2017   Procedure: RIGHT GREAT TOE CHEVRON AND WEIL OSTEOTOMY 2ND METATARSAL;  Surgeon: DNewt Minion MD;  [facility]  Service: Orthopedics;  Laterality: Right;    There were no vitals filed for this visit.  Subjective Assessment - 03/02/18 0933    Subjective  "I have been feeling better but my back starting acting up yesterday and this morning. My pain is about a 3/10. I have been doing my exercises at home."  Currently in Pain?  Yes    Pain Score  4     Pain Location  Back    Pain Orientation  Left;Lower    Pain Descriptors / Indicators  Aching    Pain Radiating Towards  down into left thigh    Pain Onset  More than a month ago    Pain Frequency  Intermittent    Aggravating Factors   unknown    Pain Relieving Factors  rest         [facility] PT Assessment - 03/02/18 0001      Observation/Other Assessments   Focus on Therapeutic Outcomes (FOTO)   34% limited                   [facility] Adult PT Treatment/Exercise - 03/02/18 0001      Lumbar Exercises: Stretches   Prone Mid Back Stretch  2 reps;30 seconds childs pose in center      Lumbar Exercises: Machines for Strengthening   Leg Press  3 plates X 10, 3 sets       Lumbar Exercises: Supine   Heel Slides  10 reps with pelvic tilt    Other Supine Lumbar Exercises  lower trunk rotations x 10 reps      Lumbar Exercises: Quadruped   Straight Leg Raise  10 reps x 2 sets      Knee/Hip Exercises: Stretches   Passive Hamstring Stretch  2 reps;30 seconds      Knee/Hip Exercises: Aerobic   Nustep  L5 x 6 min      Knee/Hip Exercises: Machines for Strengthening   Hip Cybex  abduction, extension bilaterally 2 plates 2 O03      Knee/Hip Exercises: Supine   Straight Leg Raises  Limitations;2 sets;15 reps    Straight Leg Raises Limitations  3# weight               PT Short Term Goals - 02/16/18 0859      PT SHORT TERM GOAL #1   Title  pt to be I with initial HEP    Baseline  independent    Time  3    Period  Weeks    Status  Achieved      PT SHORT TERM GOAL #2   Title  pt to verbalize and demo proper posture and lifting mechanics to prevent and reduce low back pain     Baseline  not yet practicing    Time  3    Period  Weeks    Status  On-going        PT Long Term Goals - 02/16/18 0859      PT LONG TERM GOAL #1   Title  pt maintain functional trunk flexion/ extension and improve R side bending by >/= 8 degrees  with </= 1/10 pain for functional mobility required for ADLs     Baseline  22 degrees right sidebending    Time  6    Period  Weeks    Status  Achieved      PT LONG TERM GOAL #2   Title  pt to be able to sit, stand and walk for >/= 60 min reporting </= 1/10 pain for functional mobility/ endurance required for work related activities    Baseline  may do DGI goal as Merrilee Jansky was [facility]     Time  6    Period  Weeks    Status  On-going      PT LONG  TERM GOAL #3   Title  pt to increase FOTO score to </= 41% limited to demo improvement in function     Baseline  45% limited    Time  6    Period  Weeks    Status  On-going      PT LONG TERM GOAL #4   Title  pt to be I with all HEP given as of last visit to maintain and  progress current level of function    Baseline  hamstrings WNL    Period  Weeks    Status  On-going      PT LONG TERM GOAL #5   Title  FOTO score will improve to <40% limited to demo improvement.     Time  6    Period  Weeks    Status  On-going            Plan - 03/02/18 1009    Clinical Impression Statement  Pt reports he has been feeling good lately except his back flared up yesterday and this morning. FOTO assessed today, shows improvement of 11 points since last FOTO 2.5 weeks ago. Treatment focused on core and hip strengthening. Pt reports no increases in pain at end of session.     PT Treatment/Interventions  ADLs/Self Care Home Management;Cryotherapy;Electrical Stimulation;Iontophoresis 66m/ml Dexamethasone;Moist Heat;Ultrasound;Traction;Gait training;Stair training;Patient/family education;Passive range of motion;Dry needling;Manual techniques;Therapeutic exercise;Therapeutic activities;Taping;Balance training    PT Next Visit Plan   lifting practice, Assess hip posterior innominate rotation on the L,   Posture  work. hamstring stretching, SLR, Progress stabilization exercises for home. ( gym)    PT Home Exercise Plan  hamstring stretching, SLR, self MET for L hip flexion, lower trunk rotation. Quadratus right stretches, childs pose stretch, sidelying hip abduction    Consulted and Agree with Plan of Care  Patient       Patient will benefit from skilled therapeutic intervention in order to improve the following deficits and impairments:  Pain, Abnormal gait, Decreased strength, Difficulty walking, Postural dysfunction, Improper body mechanics, Decreased range of motion, Increased fascial restricitons  Visit Diagnosis: Chronic left-sided low back pain, with sciatica presence unspecified  Pain in left hip  Muscle spasm of back     Problem List Patient Active Problem List   Diagnosis Date Noted  . DOE (dyspnea on exertion) 09/27/2017  . Bunion of great toe of right  foot 07/14/2017  . Claw toe, acquired, right 07/14/2017  . 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  . Medication management 10/09/2016  . Plantar fasciitis of right foot 02/19/2015  . Depression 08/14/2014  . Family history of heart disease in male family member before age 554410/07/2013  . Mild cognitive impairment 03/13/2014  . Parkinsonian tremor ([place] 02/12/2014  . Hyperlipidemia with target LDL less than 100   . Abdominal aortic atherosclerosis ([place]   . Moderate essential hypertension 02/25/2011  . Arthropathy 02/25/2011  . HAV (hallux abducto valgus) 02/25/2011   MWorthy Flank SPT 03/02/18 1:02 PM    [facility] [address]Phone: [phone]  Fax:  [phone] Name: Jesus JAIMEMRN: [id number] of Birth: 5Dec 14, 1952

## 2018-03-04 ENCOUNTER — Encounter: Payer: Self-pay | Admitting: Physical Therapy

## 2018-03-04 ENCOUNTER — Ambulatory Visit: Payer: 59 | Admitting: Physical Therapy

## 2018-03-04 DIAGNOSIS — M62830 Muscle spasm of back: Secondary | ICD-10-CM | POA: Diagnosis not present

## 2018-03-04 DIAGNOSIS — M25552 Pain in left hip: Secondary | ICD-10-CM | POA: Diagnosis not present

## 2018-03-04 DIAGNOSIS — M545 Low back pain: Principal | ICD-10-CM

## 2018-03-04 DIAGNOSIS — G8929 Other chronic pain: Secondary | ICD-10-CM | POA: Diagnosis not present

## 2018-03-04 NOTE — Therapy (Signed)
[place], [place], [address] Phone: [phone]   Fax:  [phone]  Physical Therapy Treatment  Patient Details  Name: [name] MRN: [id number] Date of Birth: 1951/07/24 Referring Provider: Jill Alexanders MD   Encounter Date: 03/04/2018  PT End of Session - 03/04/18 6073    Visit Number  9    Number of Visits  13    Date for PT Re-Evaluation  03/11/18    Authorization Type  [facility] [employer]    PT Start Time  0808   pt 8 min late   PT Stop Time  0856    PT Time Calculation (min)  48 min    Activity Tolerance  Patient tolerated treatment well    Behavior During Therapy  [name] for tasks assessed/performed       Past Medical History:  Diagnosis Date  . Allergy   . Arthritis   . BPH (benign prostatic hyperplasia)   . Complication of anesthesia    Pt. stated he had a reaction that ended in him requiring urinary cath placement  . Diverticulosis   . GERD (gastroesophageal reflux disease)    esophageal spasms  . Glaucoma   . Gout   . Head injury, closed, with concussion   . Hepatitis C    chronic - Has been treated with Harvoni  . HLD (hyperlipidemia)    statin intolerant (Crestor & Simvastatin) - Taking Livalo 88m / week  . Hypertension   . Parkinson's disease ([place]   . Plantar fasciitis    right  . PVD (peripheral vascular disease) (HP[place]    With no claudication; only mild abdominal aortic atherosclerosis noted on ultrasound.  . Thoracic ascending aortic aneurysm (HCC)    4.2 cm ascending TAA 09/2016 CT, 1 yr f/u rec  . Ulcer     Past Surgical History:  Procedure Laterality Date  . CARDIAC CATHETERIZATION  2005   30% Cx. Dr. GMelvern Banker . cataract surgery Left 11/05/2014  . COLONOSCOPY    . ESOPHAGOGASTRODUODENOSCOPY N/A 05/02/2015   Procedure: ESOPHAGOGASTRODUODENOSCOPY (EGD);  Surgeon: RRonald Lobo MD;  [facility];  Service: Endoscopy;  Laterality: N/A;  . ESOPHAGOGASTRODUODENOSCOPY   05/02/2015   no source of pt chest pain endoscopically evident. small hiatal hernia.  .Marland KitchenEYE SURGERY    . MEMBRANE PEEL Left 03/14/2014   Procedure: MEMBRANE PEEL; ENDOLASER;  Surgeon: GHurman Horn MD;  [facility]  Service: Ophthalmology;  Laterality: Left;  . NM MYOVIEW LTD  2011   Neg Ischemia or infarct.  .Marland KitchenNM MYOVIEW LTD  10/2015   LOW RISK. Small, fixed basal lateral defect - likely diaphragmatic attenuation. EF 69%  . PARS PLANA VITRECTOMY Left 03/14/2014   Procedure: PARS PLANA VITRECTOMY WITH 25 GAUGE;  Surgeon: GHurman Horn MD;  [facility]  Service: Ophthalmology;  Laterality: Left;  . TONSILLECTOMY    . TRANSTHORACIC ECHOCARDIOGRAM  2013   Normal EF. No significant Valve Disease  . UPPER GASTROINTESTINAL ENDOSCOPY    . WEIL OSTEOTOMY Right 09/01/2017   Procedure: RIGHT GREAT TOE CHEVRON AND WEIL OSTEOTOMY 2ND METATARSAL;  Surgeon: DNewt Minion MD;  [facility]  Service: Orthopedics;  Laterality: Right;    There were no vitals filed for this visit.  Subjective Assessment - 03/04/18 0810    Subjective  I feel good this morning. Only a little bit of pain."    Currently in Pain?  Yes    Pain Score  3     Pain Location  Back    Pain Orientation  Left;Lower    Pain Descriptors / Indicators  Discomfort    Pain Type  Chronic pain    Pain Onset  More than a month ago    Pain Frequency  Intermittent                       [facility] Adult PT Treatment/Exercise - 03/04/18 0001      Lumbar Exercises: Stretches   Single Knee to Chest Stretch  2 reps;30 seconds;Right;Left    Lower Trunk Rotation  --   10 x 5 sec hold   Prone Mid Back Stretch  30 seconds;3 reps   hands laterally and in middle   Piriformis Stretch  Left;Right;30 seconds;2 reps    Other Lumbar Stretch Exercise  cat cow x 10   requires tactile cues and demonstration     Lumbar Exercises: Machines for Strengthening   Leg Press  3 plates X 10, 2 sets      Lumbar Exercises: Supine    Other Supine Lumbar Exercises  dead bug static hold 2 x 30 seconds; dead bug with leg extension x 10 each leg   cues for pelvic tilt     Lumbar Exercises: Quadruped   Opposite Arm/Leg Raise  10 reps;Right arm/Left leg;Left arm/Right leg      Knee/Hip Exercises: Aerobic   Nustep  L5 x 6 min      Knee/Hip Exercises: Standing   Other Standing Knee Exercises  squats at parallel bars 2 x 10; cues for proper knee positioning      Modalities   Modalities  Moist Heat      Moist Heat Therapy   Number Minutes Moist Heat  10 Minutes    Moist Heat Location  Lumbar Spine             PT Education - 03/04/18 0853    Education Details  HEP update to include quadruped opposite arm/leg lift    Person(s) Educated  Patient    Methods  Explanation;Handout    Comprehension  Verbalized understanding;Verbal cues required       PT Short Term Goals - 02/16/18 0859      PT SHORT TERM GOAL #1   Title  pt to be I with initial HEP    Baseline  independent    Time  3    Period  Weeks    Status  Achieved      PT SHORT TERM GOAL #2   Title  pt to verbalize and demo proper posture and lifting mechanics to prevent and reduce low back pain     Baseline  not yet practicing    Time  3    Period  Weeks    Status  On-going        PT Long Term Goals - 02/16/18 0859      PT LONG TERM GOAL #1   Title  pt maintain functional trunk flexion/ extension and improve R side bending by >/= 8 degrees  with </= 1/10 pain for functional mobility required for ADLs     Baseline  22 degrees right sidebending    Time  6    Period  Weeks    Status  Achieved      PT LONG TERM GOAL #2   Title  pt to be able to sit, stand and walk for >/= 60 min reporting </= 1/10 pain for  functional mobility/ endurance required for work related activities    Baseline  may do DGI goal as Merrilee Jansky was [facility]     Time  6    Period  Weeks    Status  On-going      PT LONG TERM GOAL #3   Title  pt to increase FOTO score to </= 41%  limited to demo improvement in function     Baseline  45% limited    Time  6    Period  Weeks    Status  On-going      PT LONG TERM GOAL #4   Title  pt to be I with all HEP given as of last visit to maintain and progress current level of function    Baseline  hamstrings WNL    Period  Weeks    Status  On-going      PT LONG TERM GOAL #5   Title  FOTO score will improve to <40% limited to demo improvement.     Time  6    Period  Weeks    Status  On-going            Plan - 03/04/18 0847    Clinical Impression Statement  Pt reports minimal pain upon arrival. Treatment focused on stretching, core activation, and hip/knee strengthening. Progressed some exercises today; pt tolerated well. Pt reports no increases in pain at end of session.    PT Treatment/Interventions  ADLs/Self Care Home Management;Cryotherapy;Electrical Stimulation;Iontophoresis 64m/ml Dexamethasone;Moist Heat;Ultrasound;Traction;Gait training;Stair training;Patient/family education;Passive range of motion;Dry needling;Manual techniques;Therapeutic exercise;Therapeutic activities;Taping;Balance training    PT Next Visit Plan  lifting practice, stretching, core strenghtening, hip and knee strengthening    PT Home Exercise Plan  hamstring stretching, SLR, self MET for L hip flexion, lower trunk rotation. Quadratus right stretches, childs pose stretch, sidelying hip abduction    Consulted and Agree with Plan of Care  Patient       Patient will benefit from skilled therapeutic intervention in order to improve the following deficits and impairments:  Pain, Abnormal gait, Decreased strength, Difficulty walking, Postural dysfunction, Improper body mechanics, Decreased range of motion, Increased fascial restricitons  Visit Diagnosis: Chronic left-sided low back pain, with sciatica presence unspecified  Pain in left hip  Muscle spasm of back     Problem List Patient Active Problem List   Diagnosis Date Noted  . DOE  (dyspnea on exertion) 09/27/2017  . Bunion of great toe of right foot 07/14/2017  . Claw toe, acquired, right 07/14/2017  . 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  . Medication management 10/09/2016  . Plantar fasciitis of right foot 02/19/2015  . Depression 08/14/2014  . Family history of heart disease in male family member before age 1013210/07/2013  . Mild cognitive impairment 03/13/2014  . Parkinsonian tremor ([place] 02/12/2014  . Hyperlipidemia with target LDL less than 100   . Abdominal aortic atherosclerosis ([place]   . Moderate essential hypertension 02/25/2011  . Arthropathy 02/25/2011  . HAV (hallux abducto valgus) 02/25/2011   MWorthy Flank SPT 03/04/18 8:54 AM   [facility] Hospital19[address] Morris DriveGNapoleon NAlaska [address]Phone: [phone]  Fax:  [phone] Name: Jesus ELLERSMRN: [id number] of Birth: [birth year]

## 2018-03-07 ENCOUNTER — Encounter: Payer: Self-pay | Admitting: Physical Therapy

## 2018-03-07 ENCOUNTER — Ambulatory Visit: Payer: 59 | Admitting: Physical Therapy

## 2018-03-07 DIAGNOSIS — G8929 Other chronic pain: Secondary | ICD-10-CM

## 2018-03-07 DIAGNOSIS — M62830 Muscle spasm of back: Secondary | ICD-10-CM

## 2018-03-07 DIAGNOSIS — M25552 Pain in left hip: Secondary | ICD-10-CM

## 2018-03-07 DIAGNOSIS — M545 Low back pain: Secondary | ICD-10-CM | POA: Diagnosis not present

## 2018-03-07 NOTE — Therapy (Signed)
[place], [place], [address] Phone: [phone]   Fax:  [phone]  Physical Therapy Treatment  Patient Details  Name: [name] MRN: [id number] Date of Birth: 09/27/1950 Referring Provider: Jill Alexanders MD   Encounter Date: 03/07/2018  PT End of Session - 03/07/18 1015    Visit Number  10    Number of Visits  13    Date for PT Re-Evaluation  03/11/18    Authorization Type  [facility]    PT Start Time  0930    PT Stop Time  1022    PT Time Calculation (min)  52 min    Activity Tolerance  Patient tolerated treatment well    Behavior During Therapy  [facility] for tasks assessed/performed       Past Medical History:  Diagnosis Date  . Allergy   . Arthritis   . BPH (benign prostatic hyperplasia)   . Complication of anesthesia    Pt. stated he had a reaction that ended in him requiring urinary cath placement  . Diverticulosis   . GERD (gastroesophageal reflux disease)    esophageal spasms  . Glaucoma   . Gout   . Head injury, closed, with concussion   . Hepatitis C    chronic - Has been treated with Harvoni  . HLD (hyperlipidemia)    statin intolerant (Crestor & Simvastatin) - Taking Livalo 3m / week  . Hypertension   . Parkinson's disease (HBay[place]   . Plantar fasciitis    right  . PVD (peripheral vascular disease) (HK[place]    With no claudication; only mild abdominal aortic atherosclerosis noted on ultrasound.  . Thoracic ascending aortic aneurysm (HCC)    4.2 cm ascending TAA 09/2016 CT, 1 yr f/u rec  . Ulcer     Past Surgical History:  Procedure Laterality Date  . CARDIAC CATHETERIZATION  2005   30% Cx. Dr. GMelvern Banker . cataract surgery Left 11/05/2014  . COLONOSCOPY    . ESOPHAGOGASTRODUODENOSCOPY N/A 05/02/2015   Procedure: ESOPHAGOGASTRODUODENOSCOPY (EGD);  Surgeon: RRonald Lobo MD;  [facility];  Service: Endoscopy;  Laterality: N/A;  . ESOPHAGOGASTRODUODENOSCOPY  05/02/2015   no source of pt chest pain endoscopically evident. small hiatal hernia.  .Marland KitchenEYE SURGERY    . MEMBRANE PEEL Left 03/14/2014   Procedure: MEMBRANE PEEL; ENDOLASER;  Surgeon: GHurman Horn MD;  [place]  Service: Ophthalmology;  Laterality: Left;  . NM MYOVIEW LTD  2011   Neg Ischemia or infarct.  .Marland KitchenNM MYOVIEW LTD  10/2015   LOW RISK. Small, fixed basal lateral defect - likely diaphragmatic attenuation. EF 69%  . PARS PLANA VITRECTOMY Left 03/14/2014   Procedure: PARS PLANA VITRECTOMY WITH 25 GAUGE;  Surgeon: GHurman Horn MD;  [place] [facility]  Service: Ophthalmology;  Laterality: Left;  . TONSILLECTOMY    . TRANSTHORACIC ECHOCARDIOGRAM  2013   Normal EF. No significant Valve Disease  . UPPER GASTROINTESTINAL ENDOSCOPY    . WEIL OSTEOTOMY Right 09/01/2017   Procedure: RIGHT GREAT TOE CHEVRON AND WEIL OSTEOTOMY 2ND METATARSAL;  Surgeon: DNewt Minion MD;  [place]  Service: Orthopedics;  Laterality: Right;    There were no vitals filed for this visit.  Subjective Assessment - 03/07/18 0935    Subjective  "I feel good this morning. My pain was a 3/10 earlier but its probably only a 2/10 now."    Currently in Pain?  Yes    Pain Score  2     Pain Location  Back    Pain Orientation  Left;Lower    Pain Descriptors / Indicators  Sore    Pain Type  Chronic pain    Pain Onset  More than a month ago    Pain Frequency  Intermittent                       [facility] Adult PT Treatment/Exercise - 03/07/18 0001      Lumbar Exercises: Stretches   Single Knee to Chest Stretch  2 reps;30 seconds;Right;Left    Lower Trunk Rotation  --   10 x 5 sec hold   Prone Mid Back Stretch  3 reps;30 seconds   hands laterally and in the middle   Other Lumbar Stretch Exercise  cat cow x 10   requires verbal cues for correct technique     Lumbar Exercises: Machines for Strengthening   Leg Press  3 plates X 10, 3 sets      Lumbar Exercises: Supine   Other Supine Lumbar Exercises   dead bug with leg extension x 10 each leg   cues for pelvic tilt     Lumbar Exercises: Quadruped   Opposite Arm/Leg Raise  10 reps;Right arm/Left leg;Left arm/Right leg      Knee/Hip Exercises: Aerobic   Nustep  L5 x 6 min; LE only      Knee/Hip Exercises: Standing   Other Standing Knee Exercises  lifting 25lb box x 10 reps; lifting and stepping to the R x 5   Cues for increased hip flex and straight back     Moist Heat Therapy   Number Minutes Moist Heat  10 Minutes    Moist Heat Location  Lumbar Spine               PT Short Term Goals - 02/16/18 0859      PT SHORT TERM GOAL #1   Title  pt to be I with initial HEP    Baseline  independent    Time  3    Period  Weeks    Status  Achieved      PT SHORT TERM GOAL #2   Title  pt to verbalize and demo proper posture and lifting mechanics to prevent and reduce low back pain     Baseline  not yet practicing    Time  3    Period  Weeks    Status  On-going        PT Long Term Goals - 03/07/18 1003      PT LONG TERM GOAL #1   Title  pt maintain functional trunk flexion/ extension and improve R side bending by >/= 8 degrees  with </= 1/10 pain for functional mobility required for ADLs     Status  Achieved      PT LONG TERM GOAL #2   Title  pt to be able to sit, stand and walk for >/= 60 min reporting </= 1/10 pain for functional mobility/ endurance required for work related activities    Status  On-going      PT LONG TERM GOAL #3   Title  pt to increase FOTO score to </= 41% limited to demo improvement in function     Status  Achieved      PT LONG TERM GOAL #4   Title  pt to be I with all HEP given as of last visit to maintain and progress current  level of function    Status  On-going      PT LONG TERM GOAL #5   Status  Achieved            Plan - 03/07/18 0947    Clinical Impression Statement  Treatment today focused on stretching, core strengthening, and lifting mechanics. Pt continues to report  minimal pain in his back (usually a 2 or 3/10). He demonstrates good lifting mechanics with straight back, increased hip flexion, and is close to object he is lifting. He feels that he is making progress but would still like to come to his last couple of sessions. Will focus on finalizing HEP.     PT Treatment/Interventions  ADLs/Self Care Home Management;Cryotherapy;Electrical Stimulation;Iontophoresis 72m/ml Dexamethasone;Moist Heat;Ultrasound;Traction;Gait training;Stair training;Patient/family education;Passive range of motion;Dry needling;Manual techniques;Therapeutic exercise;Therapeutic activities;Taping;Balance training    PT Next Visit Plan  lifting practice, stretching, core strenghtening, hip and knee strengthening, finalize HEP    PT Home Exercise Plan  hamstring stretching, SLR, self MET for L hip flexion, lower trunk rotation. Quadratus right stretches, childs pose stretch, sidelying hip abduction    Consulted and Agree with Plan of Care  Patient       Patient will benefit from skilled therapeutic intervention in order to improve the following deficits and impairments:  Pain, Abnormal gait, Decreased strength, Difficulty walking, Postural dysfunction, Improper body mechanics, Decreased range of motion, Increased fascial restricitons  Visit Diagnosis: Chronic left-sided low back pain, with sciatica presence unspecified  Pain in left hip  Muscle spasm of back     Problem List Patient Active Problem List   Diagnosis Date Noted  . DOE (dyspnea on exertion) 09/27/2017  . Bunion of great toe of right foot 07/14/2017  . Claw toe, acquired, right 07/14/2017  . 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  . Medication management 10/09/2016  . Plantar fasciitis of right foot 02/19/2015  . Depression 08/14/2014  . Family history of heart disease in male family member before age 458410/07/2013  . Mild cognitive impairment 03/13/2014  . Parkinsonian  tremor ([place] 02/12/2014  . Hyperlipidemia with target LDL less than 100   . Abdominal aortic atherosclerosis (HCompt[place]   . Moderate essential hypertension 02/25/2011  . Arthropathy 02/25/2011  . HAV (hallux abducto valgus) 02/25/2011   MWorthy Flank SPT 03/07/18 10:18 AM   [facility] LLC[address] [address]Phone: [phone]  Fax:  [phone] Name: MWILLMER FELLERSMRN: [id number] of Birth: [birth year]

## 2018-03-09 ENCOUNTER — Ambulatory Visit: Payer: 59 | Admitting: Physical Therapy

## 2018-03-09 DIAGNOSIS — G8929 Other chronic pain: Secondary | ICD-10-CM | POA: Diagnosis not present

## 2018-03-09 DIAGNOSIS — M545 Low back pain: Secondary | ICD-10-CM | POA: Diagnosis not present

## 2018-03-09 DIAGNOSIS — M62830 Muscle spasm of back: Secondary | ICD-10-CM

## 2018-03-09 DIAGNOSIS — M25552 Pain in left hip: Secondary | ICD-10-CM | POA: Diagnosis not present

## 2018-03-09 MED FILL — ALLOPURINOL 100 MG TABLET: 100 | 30 days supply | Qty: 60 | Fill #2

## 2018-03-09 NOTE — Therapy (Addendum)
[place], [place], [address] Phone: [phone]   Fax:  [phone]  Physical Therapy Treatment/Discharge Summary  Patient Details  Name: [name] MRN: [id number] Date of Birth: 04-22-1951 Referring Provider: Jill Alexanders MD   Encounter Date: 03/09/2018  PT End of Session - 03/09/18 0938    Visit Number  11    Number of Visits  13    Date for PT Re-Evaluation  03/11/18    Authorization Type  [facility] [employer]    PT Start Time  0935    PT Stop Time  1026    PT Time Calculation (min)  51 min    Activity Tolerance  Patient tolerated treatment well    Behavior During Therapy  [facility] for tasks assessed/performed       Past Medical History:  Diagnosis Date  . Allergy   . Arthritis   . BPH (benign prostatic hyperplasia)   . Complication of anesthesia    Pt. stated he had a reaction that ended in him requiring urinary cath placement  . Diverticulosis   . GERD (gastroesophageal reflux disease)    esophageal spasms  . Glaucoma   . Gout   . Head injury, closed, with concussion   . Hepatitis C    chronic - Has been treated with Harvoni  . HLD (hyperlipidemia)    statin intolerant (Crestor & Simvastatin) - Taking Livalo 30m / week  . Hypertension   . Parkinson's disease ([place]   . Plantar fasciitis    right  . PVD (peripheral vascular disease) ([place]    With no claudication; only mild abdominal aortic atherosclerosis noted on ultrasound.  . Thoracic ascending aortic aneurysm (HCC)    4.2 cm ascending TAA 09/2016 CT, 1 yr f/u rec  . Ulcer     Past Surgical History:  Procedure Laterality Date  . CARDIAC CATHETERIZATION  2005   30% Cx. Dr. GMelvern Banker . cataract surgery Left 11/05/2014  . COLONOSCOPY    . ESOPHAGOGASTRODUODENOSCOPY N/A 05/02/2015   Procedure: ESOPHAGOGASTRODUODENOSCOPY (EGD);  Surgeon: RRonald Lobo MD;  [facility];  Service: Endoscopy;  Laterality: N/A;  .  ESOPHAGOGASTRODUODENOSCOPY  05/02/2015   no source of pt chest pain endoscopically evident. small hiatal hernia.  .Marland KitchenEYE SURGERY    . MEMBRANE PEEL Left 03/14/2014   Procedure: MEMBRANE PEEL; ENDOLASER;  Surgeon: GHurman Horn MD;  [place] [facility]  Service: Ophthalmology;  Laterality: Left;  . NM MYOVIEW LTD  2011   Neg Ischemia or infarct.  .Marland KitchenNM MYOVIEW LTD  10/2015   LOW RISK. Small, fixed basal lateral defect - likely diaphragmatic attenuation. EF 69%  . PARS PLANA VITRECTOMY Left 03/14/2014   Procedure: PARS PLANA VITRECTOMY WITH 25 GAUGE;  Surgeon: GHurman Horn MD;  [facility]  Service: Ophthalmology;  Laterality: Left;  . TONSILLECTOMY    . TRANSTHORACIC ECHOCARDIOGRAM  2013   Normal EF. No significant Valve Disease  . UPPER GASTROINTESTINAL ENDOSCOPY    . WEIL OSTEOTOMY Right 09/01/2017   Procedure: RIGHT GREAT TOE CHEVRON AND WEIL OSTEOTOMY 2ND METATARSAL;  Surgeon: DNewt Minion MD;  [place] [facility]  Service: Orthopedics;  Laterality: Right;    There were no vitals filed for this visit.  Subjective Assessment - 03/09/18 0938    Subjective  "My back was feeling tight earlier. I took an ibuprofen and it feels a little better. I have an ingrown toe nail that hurts."    Currently in  Pain?  Yes    Pain Score  2     Pain Location  Back    Pain Orientation  Left;Lower    Pain Descriptors / Indicators  Sore   tight   Pain Type  Chronic pain    Pain Onset  More than a month ago    Pain Frequency  Intermittent    Aggravating Factors   amount of time on feet, picking up something that is too heavy    Pain Relieving Factors  rest and stretching                       [facility] Adult PT Treatment/Exercise - 03/09/18 0001      Lumbar Exercises: Stretches   Lower Trunk Rotation  --   10 x 5 sec hold   Prone Mid Back Stretch  3 reps;30 seconds   hands laterally and in the middle   Piriformis Stretch  Right;Left;1 rep;30 seconds    Other Lumbar Stretch Exercise   figure 4 stretch 30 sec x 2 R and L      Lumbar Exercises: Standing   Other Standing Lumbar Exercises  lift and chop 2 x 10-15 reps ea direction   green theraband     Lumbar Exercises: Supine   Other Supine Lumbar Exercises  dead bug with leg extension 3 x 10 each leg; use physioball for tactile cue   2nd and 3rd set opposite arm/leg     Modalities   Modalities  Moist Heat      Moist Heat Therapy   Number Minutes Moist Heat  10 Minutes    Moist Heat Location  Lumbar Spine             PT Education - 03/09/18 1115    Education Details  discharge and plan of care, HEP update, importance of continuing exercise after discharge    Person(s) Educated  Patient    Methods  Explanation;Handout    Comprehension  Verbalized understanding      PT Short Term Goals - 03/09/18 1205      PT SHORT TERM GOAL #1   Title  pt to be I with initial HEP    Time  3    Period  Weeks    Status  Achieved      PT SHORT TERM GOAL #2   Title  pt to verbalize and demo proper posture and lifting mechanics to prevent and reduce low back pain     Time  3    Period  Weeks    Status  Achieved         PT Long Term Goals - 03/09/18 1205      PT LONG TERM GOAL #1   Title  pt maintain functional trunk flexion/ extension and improve R side bending by >/= 8 degrees  with </= 1/10 pain for functional mobility required for ADLs     Time  6    Period  Weeks    Status  Achieved      PT LONG TERM GOAL #2   Title  pt to be able to sit, stand and walk for >/= 60 min reporting </= 1/10 pain for functional mobility/ endurance required for work related activities    Period  Weeks    Status  Partially Met      PT LONG TERM GOAL #3   Title  pt to increase FOTO score to </= 41% limited to demo improvement in  function     Period  Weeks    Status  Achieved      PT LONG TERM GOAL #4   Title  pt to be I with all HEP given as of last visit to maintain and progress current level of function    Time  6     Period  Weeks    Status  Achieved      PT LONG TERM GOAL #5   Title  FOTO score will improve to <40% limited to demo improvement.     Time  6    Period  Weeks    Status  Achieved      .     Plan - 03/09/18 1116    Clinical Impression Statement  Treatment today focused on hip and core strengthening. Pt progressing well with exercises. Based on pt's progress and level of function, pt appropriate for discharge at this point. HEP updated and pt given copies of old HEP exercises to continue with at home. Pt discharged today.     PT Treatment/Interventions  ADLs/Self Care Home Management;Cryotherapy;Electrical Stimulation;Iontophoresis 26m/ml Dexamethasone;Moist Heat;Ultrasound;Traction;Gait training;Stair training;Patient/family education;Passive range of motion;Dry needling;Manual techniques;Therapeutic exercise;Therapeutic activities;Taping;Balance training    PT Next Visit Plan  pt discharged today    PT Home Exercise Plan  hamstring stretching, SLR, self MET for L hip flexion, lower trunk rotation. Quadratus right stretches, childs pose stretch, sidelying hip abduction    Consulted and Agree with Plan of Care  Patient       Patient will benefit from skilled therapeutic intervention in order to improve the following deficits and impairments:  Pain, Abnormal gait, Decreased strength, Difficulty walking, Postural dysfunction, Improper body mechanics, Decreased range of motion, Increased fascial restricitons  Visit Diagnosis: Chronic left-sided low back pain, with sciatica presence unspecified  Pain in left hip  Muscle spasm of back     Problem List Patient Active Problem List   Diagnosis Date Noted  . DOE (dyspnea on exertion) 09/27/2017  . Bunion of great toe of right foot 07/14/2017  . Claw toe, acquired, right 07/14/2017  . 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  . Medication management 10/09/2016  . Plantar fasciitis of right foot  02/19/2015  . Depression 08/14/2014  . Family history of heart disease in male family member before age 605910/07/2013  . Mild cognitive impairment 03/13/2014  . Parkinsonian tremor ([place] 02/12/2014  . Hyperlipidemia with target LDL less than 100   . Abdominal aortic atherosclerosis ([place]   . Moderate essential hypertension 02/25/2011  . Arthropathy 02/25/2011  . HAV (hallux abducto valgus) 02/25/2011   MWorthy Flank SPT 03/09/18 11:33 AM   [facility] [address]Phone: [phone]  Fax:  [phone] Name: Jesus WEISENSELMRN: [id number] of Birth: 502-01-1951  PHYSICAL THERAPY DISCHARGE SUMMARY  Visits from Start of Care:   Current functional level related to goals / functional outcomes: See above   Remaining deficits: Mild pain   Education / Equipment: HEP, therabands   Plan: Patient agrees to discharge.  Patient goals were partially met. Patient is being discharged due to being pleased with the current functional level.  ?????

## 2018-03-14 ENCOUNTER — Ambulatory Visit (INDEPENDENT_AMBULATORY_CARE_PROVIDER_SITE_OTHER): Payer: 59 | Admitting: Orthopedic Surgery

## 2018-03-14 ENCOUNTER — Encounter (INDEPENDENT_AMBULATORY_CARE_PROVIDER_SITE_OTHER): Payer: Self-pay | Admitting: Orthopedic Surgery

## 2018-03-14 ENCOUNTER — Encounter: Payer: 59 | Admitting: Physical Therapy

## 2018-03-14 VITALS — Ht 68.0 in | Wt 168.0 lb

## 2018-03-14 DIAGNOSIS — M21611 Bunion of right foot: Secondary | ICD-10-CM

## 2018-03-14 DIAGNOSIS — M205X1 Other deformities of toe(s) (acquired), right foot: Secondary | ICD-10-CM

## 2018-03-14 MED FILL — IRBESARTAN 150 MG TABLET: 150 | 30 days supply | Qty: 30 | Fill #2

## 2018-03-14 NOTE — Progress Notes (Signed)
Office Visit Note   Patient: [name]           Date of Birth: 05/28/1951           MRN: [id number] Visit Date: 03/14/2018              Requested by: Denita Lung, MD [address] [place], [name] [address] PCP: Denita Lung, MD  Chief Complaint  Patient presents with  . Right Foot - Follow-up    09/01/17 right foot chevron and weil osteotomy 2nd MT      HPI: Patient is a 67 who presents 6 months status post great toe chevron osteotomy and Weil osteotomy for the second metatarsal.  Patient complains of floating of the second toe he is using a felt pad he does have [place] sneakers no orthotics.  Assessment & Plan: Visit Diagnoses:  1. Bunion of great toe of right foot   2. Claw toe, acquired, right     Plan: A felt arch support was applied patient was given instruction and demonstrated stretching and scar massage for the second toe MTP.  After brief massage his toes did lay down flat.  Patient will follow-up as needed.  Follow-Up Instructions: Return if symptoms worsen or fail to improve.   Ortho Exam  Patient is alert, oriented, no adenopathy, well-dressed, normal affect, normal respiratory effort. Examination patient has good dorsiflexion of the ankle he has scar tissue over the second toe MTP joint.  The toe was floating with scar massage this levels out.  Patient has no pain with range of motion of the MTP joint of the great toe.  Imaging: No results found. No images are attached to the encounter.  Labs: Lab Results  Component Value Date   LABURIC 4.6 11/29/2017     Lab Results  Component Value Date   ALBUMIN 4.5 09/29/2017   ALBUMIN 3.9 09/01/2017   ALBUMIN 4.5 09/28/2016   LABURIC 4.6 11/29/2017    Body mass index is 25.54 kg/m.  Orders:  No orders of the defined types were placed in this encounter.  No orders of the defined types were placed in this encounter.    Procedures: No procedures performed  Clinical Data: No  additional findings.  ROS:  All other systems negative, except as noted in the HPI. Review of Systems  Objective: Vital Signs: Ht 5\' 8"  (1.727 m)   Wt 168 lb (76.2 kg)   BMI 25.54 kg/m   Specialty Comments:  No specialty comments available.  PMFS History: Patient Active Problem List   Diagnosis Date Noted  . DOE (dyspnea on exertion) 09/27/2017  . Bunion of great toe of right foot 07/14/2017  . Claw toe, acquired, right 07/14/2017  . 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  . Medication management 10/09/2016  . Plantar fasciitis of right foot 02/19/2015  . Depression 08/14/2014  . Family history of heart disease in male family member before age 56 04/26/2014  . Mild cognitive impairment 03/13/2014  . Parkinsonian tremor ([place]) 02/12/2014  . Hyperlipidemia with target LDL less than 100   . Abdominal aortic atherosclerosis ([place])   . Moderate essential hypertension 02/25/2011  . Arthropathy 02/25/2011  . HAV (hallux abducto valgus) 02/25/2011   Past Medical History:  Diagnosis Date  . Allergy   . Arthritis   . BPH (benign prostatic hyperplasia)   . Complication of anesthesia    Pt. stated he had a reaction that ended in him requiring urinary cath  placement  . Diverticulosis   . GERD (gastroesophageal reflux disease)    esophageal spasms  . Glaucoma   . Gout   . Head injury, closed, with concussion   . Hepatitis C    chronic - Has been treated with Harvoni  . HLD (hyperlipidemia)    statin intolerant (Crestor & Simvastatin) - Taking Livalo 1mg  / week  . Hypertension   . Parkinson's disease ([place])   . Plantar fasciitis    right  . PVD (peripheral vascular disease) ([place])    With no claudication; only mild abdominal aortic atherosclerosis noted on ultrasound.  . Thoracic ascending aortic aneurysm (HCC)    4.2 cm ascending TAA 09/2016 CT, 1 yr f/u rec  . Ulcer     Family History  Problem Relation Age of Onset  . Heart disease  Mother   . Cancer Paternal Grandfather   . Cancer Sister     Past Surgical History:  Procedure Laterality Date  . CARDIAC CATHETERIZATION  2005   30% Cx. Dr. Melvern Banker  . cataract surgery Left 11/05/2014  . COLONOSCOPY    . ESOPHAGOGASTRODUODENOSCOPY N/A 05/02/2015   Procedure: ESOPHAGOGASTRODUODENOSCOPY (EGD);  Surgeon: Ronald Lobo, MD;  [facility];  Service: Endoscopy;  Laterality: N/A;  . ESOPHAGOGASTRODUODENOSCOPY  05/02/2015   no source of pt chest pain endoscopically evident. small hiatal hernia.  [name] EYE SURGERY    . MEMBRANE PEEL Left 03/14/2014   Procedure: MEMBRANE PEEL; ENDOLASER;  Surgeon: Hurman Horn, MD;  Location: [place];  Service: Ophthalmology;  Laterality: Left;  . NM MYOVIEW LTD  2011   Neg Ischemia or infarct.  [name] NM MYOVIEW LTD  10/2015   LOW RISK. Small, fixed basal lateral defect - likely diaphragmatic attenuation. EF 69%  . PARS PLANA VITRECTOMY Left 03/14/2014   Procedure: PARS PLANA VITRECTOMY WITH 25 GAUGE;  Surgeon: Hurman Horn, MD;  Location: [place];  Service: Ophthalmology;  Laterality: Left;  . TONSILLECTOMY    . TRANSTHORACIC ECHOCARDIOGRAM  2013   Normal EF. No significant Valve Disease  . UPPER GASTROINTESTINAL ENDOSCOPY    . WEIL OSTEOTOMY Right 09/01/2017   Procedure: RIGHT GREAT TOE CHEVRON AND WEIL OSTEOTOMY 2ND METATARSAL;  Surgeon: Newt Minion, MD;  Location: [place];  Service: Orthopedics;  Laterality: Right;   Social History   Occupational History  . 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 Smoking status: Former Smoker    Types: Cigars    Last attempt to quit: 07/27/1988    Years since quitting: 29.6  . Smokeless tobacco: Never Used  Substance and Sexual Activity  . Alcohol use: Yes    Alcohol/week: 3.0 standard drinks    Types: 1 Glasses of wine, 1 Cans of beer, 1 Shots of liquor per week    Comment: occasional  . Drug use: No  . Sexual activity: Yes

## 2018-03-21 ENCOUNTER — Encounter: Payer: 59 | Admitting: Physical Therapy

## 2018-03-30 ENCOUNTER — Telehealth: Payer: Self-pay | Admitting: Adult Health

## 2018-03-30 NOTE — Telephone Encounter (Signed)
RN call patient about his concerns for sinemet.He look at the medication side effects,and concerns on a sheet of paper he had. Pt is reporting low mood, being sleepy a lot, having a lot of dreams, and confusion. Pt stated its been having for a couple of months now. Pt has been on the medication for 5 years. Rn stated a message will be sent to [facility] NP. Pt verbalized understanding.

## 2018-03-30 NOTE — Telephone Encounter (Signed)
I did not see the patient recently Janett Billow did however but I have seen him in the past he has had mild cognitive impairment and depression which would go along with his symptoms.  He has been on Sinemet for quite some time and you do not just suddenly started having side effects to the medication.please call the pt

## 2018-03-30 NOTE — Telephone Encounter (Signed)
Pt requesting a call stating he has a few concerns regarding his medication carbidopa-levodopa (SINEMET IR) 25-100 MG tablet. Did not wish to discuss further with me.

## 2018-03-30 NOTE — Telephone Encounter (Signed)
This is a patient of Carolyns. I did see him for a 1 time visit due to Hoyle Sauer being out sick. This will be routed to her. Thank you.

## 2018-03-30 NOTE — Telephone Encounter (Signed)
Pt wife(Kujawa,[name] on DPR) has called just wanted it added so RN can be aware that the symptoms pt Is having from taking the carbidopa-levodopa (SINEMET IR) 25-100 MG tablet.  Are symptoms that he has had for months.  Wife states she doesn't want RN to think the symptoms are something that just started. Please call

## 2018-03-31 NOTE — Telephone Encounter (Addendum)
Rn call patient back to give him Hoyle Sauer NP recommendations. Rn stated per Hoyle Sauer NP note, pt does have some mild cognitive impairment, and depression. Rn stated per NP his symptoms would not happen suddenly with side effects. Rn stated he has been on the medication since 2015. RN stated Dr. [name] and Hoyle Sauer NP had address his mild cognitive issues in past visits.Rn recommend pt call his PCP if he is having problems sleeping or increase depression. Pt verbalized understanding.

## 2018-03-31 NOTE — Telephone Encounter (Signed)
Revised. 

## 2018-04-07 ENCOUNTER — Telehealth (INDEPENDENT_AMBULATORY_CARE_PROVIDER_SITE_OTHER): Payer: Self-pay | Admitting: Physical Medicine and Rehabilitation

## 2018-04-07 NOTE — Telephone Encounter (Signed)
Left message for patient to call back to discuss.
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
## 2018-04-11 ENCOUNTER — Other Ambulatory Visit: Payer: Self-pay | Admitting: Physician Assistant

## 2018-04-11 DIAGNOSIS — M48062 Spinal stenosis, lumbar region with neurogenic claudication: Secondary | ICD-10-CM

## 2018-04-13 ENCOUNTER — Other Ambulatory Visit (INDEPENDENT_AMBULATORY_CARE_PROVIDER_SITE_OTHER): Payer: Self-pay | Admitting: Orthopedic Surgery

## 2018-04-13 MED FILL — ALLOPURINOL 100 MG TABLET: 100 | 30 days supply | Qty: 60 | Fill #0

## 2018-04-13 MED FILL — ALFUZOSIN HCL ER 10 MG TAB: 10 | 90 days supply | Qty: 90 | Fill #0

## 2018-04-13 MED FILL — IRBESARTAN 150 MG TABLET: 150 | 30 days supply | Qty: 30 | Fill #3

## 2018-04-13 NOTE — Telephone Encounter (Signed)
Rx request 

## 2018-04-15 ENCOUNTER — Ambulatory Visit: Payer: 59 | Admitting: Family Medicine

## 2018-04-15 ENCOUNTER — Encounter: Payer: Self-pay | Admitting: Family Medicine

## 2018-04-15 VITALS — BP 118/72 | HR 73 | Temp 97.6°F | Wt 169.6 lb

## 2018-04-15 DIAGNOSIS — Z23 Encounter for immunization: Secondary | ICD-10-CM

## 2018-04-15 DIAGNOSIS — M48062 Spinal stenosis, lumbar region with neurogenic claudication: Secondary | ICD-10-CM | POA: Diagnosis not present

## 2018-04-15 MED ORDER — TRAMADOL HCL 50 MG PO TABS: 50.0000 mg | ORAL_TABLET | Freq: Four times a day (QID) | ORAL | 0 refills | Status: DC | PRN

## 2018-04-15 MED FILL — traMADol HCL 50 MG TABS: 50 | 12 days supply | Qty: 50 | Fill #0

## 2018-04-15 NOTE — Progress Notes (Signed)
   Subjective:    Patient ID: [name], male    DOB: 07/15/1951, 67 y.o.   MRN: [id number]  HPI He is here for consult concerning his spinal stenosis.  He is scheduled for an epidural injection 1 October.  He is still having a lot of pain and would like a refill on tramadol.  He has used this in the past with good results.  He would also like a flu shot.   Review of Systems     Objective:   Physical Exam Alert and in no distress otherwise not examined       Assessment & Plan:  Spinal stenosis of lumbar region with neurogenic claudication - Plan: traMADol (ULTRAM) 50 MG tablet  Need for influenza vaccination - Plan: Flu vaccine HIGH DOSE PF (Fluzone High dose) I discussed spinal stenosis in regard to epidural injections, possible spinal cord stimulators and surgery.  Hopefully the injections will continue to work.  He states that they usually last 6 to 8 months.

## 2018-04-18 ENCOUNTER — Inpatient Hospital Stay (HOSPITAL_COMMUNITY): Payer: 59

## 2018-04-18 ENCOUNTER — Emergency Department (HOSPITAL_COMMUNITY): Payer: 59

## 2018-04-18 ENCOUNTER — Other Ambulatory Visit: Payer: Self-pay

## 2018-04-18 ENCOUNTER — Encounter (HOSPITAL_COMMUNITY): Payer: Self-pay | Admitting: Emergency Medicine

## 2018-04-18 ENCOUNTER — Inpatient Hospital Stay (HOSPITAL_COMMUNITY)
Admission: EM | Admit: 2018-04-18 | Discharge: 2018-04-19 | DRG: 390 | Disposition: A | Discharge status: Home / Self Care | Payer: 59 | Attending: Internal Medicine | Admitting: Internal Medicine

## 2018-04-18 DIAGNOSIS — Z0189 Encounter for other specified special examinations: Secondary | ICD-10-CM

## 2018-04-18 DIAGNOSIS — N400 Enlarged prostate without lower urinary tract symptoms: Secondary | ICD-10-CM | POA: Diagnosis present

## 2018-04-18 DIAGNOSIS — K56600 Partial intestinal obstruction, unspecified as to cause: Secondary | ICD-10-CM | POA: Diagnosis not present

## 2018-04-18 DIAGNOSIS — K429 Umbilical hernia without obstruction or gangrene: Secondary | ICD-10-CM | POA: Diagnosis present

## 2018-04-18 DIAGNOSIS — E785 Hyperlipidemia, unspecified: Secondary | ICD-10-CM | POA: Diagnosis present

## 2018-04-18 DIAGNOSIS — Z8249 Family history of ischemic heart disease and other diseases of the circulatory system: Secondary | ICD-10-CM | POA: Diagnosis not present

## 2018-04-18 DIAGNOSIS — I10 Essential (primary) hypertension: Secondary | ICD-10-CM | POA: Diagnosis present

## 2018-04-18 DIAGNOSIS — R188 Other ascites: Secondary | ICD-10-CM | POA: Diagnosis not present

## 2018-04-18 DIAGNOSIS — M1990 Unspecified osteoarthritis, unspecified site: Secondary | ICD-10-CM | POA: Diagnosis present

## 2018-04-18 DIAGNOSIS — G20 Parkinson's disease: Secondary | ICD-10-CM | POA: Diagnosis present

## 2018-04-18 DIAGNOSIS — Z87891 Personal history of nicotine dependence: Secondary | ICD-10-CM | POA: Diagnosis not present

## 2018-04-18 DIAGNOSIS — K56609 Unspecified intestinal obstruction, unspecified as to partial versus complete obstruction: Secondary | ICD-10-CM | POA: Diagnosis not present

## 2018-04-18 DIAGNOSIS — K56601 Complete intestinal obstruction, unspecified as to cause: Secondary | ICD-10-CM | POA: Diagnosis not present

## 2018-04-18 DIAGNOSIS — M109 Gout, unspecified: Secondary | ICD-10-CM | POA: Diagnosis present

## 2018-04-18 LAB — CBC WITH DIFFERENTIAL/PLATELET
BASOS ABS: 0 10*3/uL (ref 0.0–0.1)
Basophils Relative: 1 %
Eosinophils Absolute: 0.1 10*3/uL (ref 0.0–0.7)
Eosinophils Relative: 1 %
HEMATOCRIT: 43.8 % (ref 39.0–52.0)
Hemoglobin: 14.6 g/dL (ref 13.0–17.0)
LYMPHS ABS: 0.9 10*3/uL (ref 0.7–4.0)
LYMPHS PCT: 15 %
MCH: 31 pg (ref 26.0–34.0)
MCHC: 33.3 g/dL (ref 30.0–36.0)
MCV: 93 fL (ref 78.0–100.0)
MONO ABS: 0.6 10*3/uL (ref 0.1–1.0)
Monocytes Relative: 9 %
NEUTROS ABS: 4.5 10*3/uL (ref 1.7–7.7)
Neutrophils Relative %: 74 %
Platelets: 264 10*3/uL (ref 150–400)
RBC: 4.71 MIL/uL (ref 4.22–5.81)
RDW: 13.1 % (ref 11.5–15.5)
WBC: 6 10*3/uL (ref 4.0–10.5)

## 2018-04-18 LAB — COMPREHENSIVE METABOLIC PANEL
ALT: 9 U/L (ref 0–44)
AST: 16 U/L (ref 15–41)
Albumin: 4.5 g/dL (ref 3.5–5.0)
Alkaline Phosphatase: 57 U/L (ref 38–126)
Anion gap: 8 (ref 5–15)
BILIRUBIN TOTAL: 0.9 mg/dL (ref 0.3–1.2)
BUN: 20 mg/dL (ref 8–23)
CO2: 27 mmol/L (ref 22–32)
CREATININE: 1.21 mg/dL (ref 0.61–1.24)
Calcium: 9.4 mg/dL (ref 8.9–10.3)
Chloride: 104 mmol/L (ref 98–111)
GFR, EST NON AFRICAN AMERICAN: 60 mL/min — AB (ref >60)
Glucose, Bld: 102 mg/dL — ABNORMAL HIGH (ref 70–99)
Potassium: 4.1 mmol/L (ref 3.5–5.1)
Sodium: 139 mmol/L (ref 135–145)
TOTAL PROTEIN: 8 g/dL (ref 6.5–8.1)

## 2018-04-18 LAB — URINALYSIS, ROUTINE W REFLEX MICROSCOPIC
BILIRUBIN URINE: NEGATIVE
GLUCOSE, UA: NEGATIVE mg/dL
HGB URINE DIPSTICK: NEGATIVE
KETONES UR: NEGATIVE mg/dL
Leukocytes, UA: NEGATIVE
Nitrite: NEGATIVE
PROTEIN: NEGATIVE mg/dL
Specific Gravity, Urine: 1.013 (ref 1.005–1.030)
pH: 7 (ref 5.0–8.0)

## 2018-04-18 LAB — LIPASE, BLOOD: LIPASE: 29 U/L (ref 11–51)

## 2018-04-18 LAB — LACTIC ACID, PLASMA
Lactic Acid, Venous: 1.2 mmol/L (ref 0.5–1.9)
Lactic Acid, Venous: 1.2 mmol/L (ref 0.5–1.9)

## 2018-04-18 MED ORDER — DIATRIZOATE MEGLUMINE & SODIUM 66-10 % PO SOLN
90.0000 mL | Freq: Once | ORAL | Status: AC
  Administered 2018-04-18: 90 mL via ORAL
  Filled 2018-04-18: qty 90

## 2018-04-18 MED ORDER — MORPHINE SULFATE (PF) 2 MG/ML IV SOLN
2.0000 mg | INTRAVENOUS | Status: DC | PRN
  Administered 2018-04-18 – 2018-04-19 (×2): 2 mg via INTRAVENOUS
  Filled 2018-04-18: qty 1

## 2018-04-18 MED ORDER — HYDRALAZINE HCL 20 MG/ML IJ SOLN
5.0000 mg | INTRAMUSCULAR | Status: DC | PRN
  Administered 2018-04-18: 5 mg via INTRAVENOUS
  Filled 2018-04-18: qty 1

## 2018-04-18 MED ORDER — CARBIDOPA-LEVODOPA 25-100 MG PO TABS
1.5000 | ORAL_TABLET | Freq: Three times a day (TID) | ORAL | Status: DC
  Administered 2018-04-18 – 2018-04-19 (×4): 1.5 via ORAL
  Filled 2018-04-18 (×4): qty 2

## 2018-04-18 MED ORDER — IOPAMIDOL (ISOVUE-300) INJECTION 61%
INTRAVENOUS | Status: AC
  Filled 2018-04-18: qty 100

## 2018-04-18 MED ORDER — ONDANSETRON HCL 4 MG/2ML IJ SOLN
4.0000 mg | Freq: Four times a day (QID) | INTRAMUSCULAR | Status: DC | PRN
  Administered 2018-04-18: 4 mg via INTRAVENOUS
  Filled 2018-04-18: qty 2

## 2018-04-18 MED ORDER — LACTATED RINGERS IV BOLUS
1000.0000 mL | Freq: Once | INTRAVENOUS | Status: AC
  Administered 2018-04-18: 1000 mL via INTRAVENOUS

## 2018-04-18 MED ORDER — IRBESARTAN 150 MG PO TABS
150.0000 mg | ORAL_TABLET | Freq: Every evening | ORAL | Status: DC
  Administered 2018-04-18: 150 mg via ORAL
  Filled 2018-04-18: qty 1

## 2018-04-18 MED ORDER — VERAPAMIL HCL ER 240 MG PO TBCR
240.0000 mg | EXTENDED_RELEASE_TABLET | Freq: Every day | ORAL | Status: DC
  Administered 2018-04-18 – 2018-04-19 (×2): 240 mg via ORAL
  Filled 2018-04-18 (×2): qty 1

## 2018-04-18 MED ORDER — MAGNESIUM CITRATE PO SOLN
1.0000 | Freq: Once | ORAL | Status: AC
  Administered 2018-04-18: 1 via ORAL
  Filled 2018-04-18: qty 296

## 2018-04-18 MED ORDER — DEXTROSE IN LACTATED RINGERS 5 % IV SOLN
INTRAVENOUS | Status: DC
  Administered 2018-04-18 – 2018-04-19 (×2): via INTRAVENOUS

## 2018-04-18 MED ORDER — MORPHINE SULFATE (PF) 2 MG/ML IV SOLN
2.0000 mg | INTRAVENOUS | Status: DC | PRN
  Administered 2018-04-18: 2 mg via INTRAVENOUS
  Filled 2018-04-18 (×2): qty 1

## 2018-04-18 MED ORDER — IOPAMIDOL (ISOVUE-300) INJECTION 61%
100.0000 mL | Freq: Once | INTRAVENOUS | Status: AC | PRN
  Administered 2018-04-18: 100 mL via INTRAVENOUS

## 2018-04-18 MED ORDER — FENTANYL CITRATE (PF) 100 MCG/2ML IJ SOLN
50.0000 ug | Freq: Once | INTRAMUSCULAR | Status: AC
  Administered 2018-04-18: 50 ug via INTRAVENOUS
  Filled 2018-04-18: qty 2

## 2018-04-18 NOTE — H&P (Signed)
History and Physical    EDEM TIEGS ZJQ:[id number] DOB: 1951-01-28 DOA: 04/18/2018  I have briefly reviewed the patient's prior medical records in [place]  PCP: Denita Lung, MD  Patient coming from: home  Chief Complaint: Abdominal pain  HPI: [name] is a 67 y.o. male with medical history significant of hypertension, osteoarthritis, parkinsonian tremor, BPH, hyperlipidemia, presents to the hospital with chief complaint of abdominal pain.  He noticed crampy abdominal pain upon waking up this morning, and it was severe and decided to come to the emergency room.  Patient has been having increased back pain over the last several weeks, scheduled an appointment to get a steroid shot as he always does, and has required to take more tramadol in the last few days.  He initially thought he was constipated as he had no bowel movement this morning.  He denies any fever or chills, denies any nausea or vomiting.  He denies any chest pain or shortness of breath.  He has had no lightheadedness or dizziness.  He denies any prior personal history of abdominal surgeries or any other issues in the past with his abdomen.  Last bowel movement was yesterday, reports passing some flatus this morning but not much  ED Course: In the emergency room his vital signs are stable, he is slightly hypertensive.  Blood work is essentially unremarkable.  CT scan of the abdomen pelvis showed a closed loop small bowel obstruction.  General surgery was consulted and we were asked to admit.  Review of Systems: As per HPI otherwise 10 point review of systems negative.   Past Medical History:  Diagnosis Date  . Allergy   . Arthritis   . BPH (benign prostatic hyperplasia)   . Complication of anesthesia    Pt. stated he had a reaction that ended in him requiring urinary cath placement  . Diverticulosis   . GERD (gastroesophageal reflux disease)    esophageal spasms  . Glaucoma   . Gout   . Head injury,  closed, with concussion   . Hepatitis C    chronic - Has been treated with Harvoni  . HLD (hyperlipidemia)    statin intolerant (Crestor & Simvastatin) - Taking Livalo 1mg  / week  . Hypertension   . Parkinson's disease ([place])   . Plantar fasciitis    right  . PVD (peripheral vascular disease) ([place])    With no claudication; only mild abdominal aortic atherosclerosis noted on ultrasound.  . Thoracic ascending aortic aneurysm (HCC)    4.2 cm ascending TAA 09/2016 CT, 1 yr f/u rec  . Ulcer     Past Surgical History:  Procedure Laterality Date  . CARDIAC CATHETERIZATION  2005   30% Cx. Dr. Melvern Banker  . cataract surgery Left 11/05/2014  . COLONOSCOPY    . ESOPHAGOGASTRODUODENOSCOPY N/A 05/02/2015   Procedure: ESOPHAGOGASTRODUODENOSCOPY (EGD);  Surgeon: Ronald Lobo, MD;  Location: [facility];  Service: Endoscopy;  Laterality: N/A;  . ESOPHAGOGASTRODUODENOSCOPY  05/02/2015   no source of pt chest pain endoscopically evident. small hiatal hernia.  [name] EYE SURGERY    . MEMBRANE PEEL Left 03/14/2014   Procedure: MEMBRANE PEEL; ENDOLASER;  Surgeon: Hurman Horn, MD;  Location: [facility];  Service: Ophthalmology;  Laterality: Left;  . NM MYOVIEW LTD  2011   Neg Ischemia or infarct.  [name] NM MYOVIEW LTD  10/2015   LOW RISK. Small, fixed basal lateral defect - likely diaphragmatic attenuation. EF 69%  . PARS PLANA VITRECTOMY Left 03/14/2014  Procedure: PARS PLANA VITRECTOMY WITH 25 GAUGE;  Surgeon: Hurman Horn, MD;  Location: [facility];  Service: Ophthalmology;  Laterality: Left;  . TONSILLECTOMY    . TRANSTHORACIC ECHOCARDIOGRAM  2013   Normal EF. No significant Valve Disease  . UPPER GASTROINTESTINAL ENDOSCOPY    . WEIL OSTEOTOMY Right 09/01/2017   Procedure: RIGHT GREAT TOE CHEVRON AND WEIL OSTEOTOMY 2ND METATARSAL;  Surgeon: Newt Minion, MD;  Location: [facility];  Service: Orthopedics;  Laterality: Right;     reports that he quit smoking about 29 years ago. His smoking use included cigars. He has  never used smokeless tobacco. He reports that he drinks about 3.0 standard drinks of alcohol per week. He reports that he does not use drugs.  No Known Allergies  Family History  Problem Relation Age of Onset  . Heart disease Mother   . Cancer Paternal Grandfather   . Cancer Sister     Prior to Admission medications   Medication Sig Start Date End Date Taking? Authorizing Provider  Acetaminophen (ACETAMIN PO) Take 500 mg by mouth every 4 (four) hours as needed (pain).    Yes [provider]  alfuzosin (UROXATRAL) 10 MG 24 hr tablet Take 10 mg by mouth at bedtime.    Yes [provider]  allopurinol (ZYLOPRIM) 100 MG tablet TAKE 1 TABLET BY MOUTH TWICE A DAY Patient taking differently: Take 100 mg by mouth 2 (two) times daily.  04/13/18  Yes Dondra Prader R, NP  carbidopa-levodopa (SINEMET IR) 25-100 MG tablet TAKE 1 AND 1/2 TABLET BY MOUTH 3 TIMES DAILY. Patient taking differently: Take 1.5 tablets by mouth 3 (three) times daily.  02/16/18  Yes Venancio Poisson, NP  diclofenac sodium (VOLTAREN) 1 % GEL Apply 2 g topically 4 (four) times daily. Patient taking differently: Apply 2 g topically 4 (four) times daily as needed (pain).  04/01/17  Yes Leandrew Koyanagi, MD  ibuprofen (ADVIL,MOTRIN) 200 MG tablet Take 400 mg by mouth 2 (two) times daily as needed for headache or moderate pain.   Yes [provider]  irbesartan (AVAPRO) 150 MG tablet Take 1 tablet (150 mg total) by mouth every evening. 01/12/18  Yes [name], MD  LIVALO 1 MG TABS TAKE 1 TABLET BY MOUTH 2 TIMES A WEEK Patient taking differently: Take 1 mg by mouth 2 (two) times a week. Takes on Monday and Friday 12/09/17  Yes [name], MD  Multiple Vitamin (MULTIVITAMIN WITH MINERALS) TABS tablet Take 1 tablet by mouth 4 (four) times a week.    Yes [provider]  nitroGLYCERIN (NITROSTAT) 0.4 MG SL tablet PLACE 1 TABLET UNDER THE TONGUE EVERY 5 MINUTES AS NEEDED FOR CHEST PAIN. Patient  taking differently: Place 0.4 mg under the tongue every 5 (five) minutes as needed for chest pain.  06/02/17  Yes [name], MD  pantoprazole (PROTONIX) 40 MG tablet Take 40 mg by mouth 2 (two) times daily.  06/17/15  Yes [provider]  timolol (TIMOPTIC) 0.5 % ophthalmic solution Place 1 drop into the left eye daily.    Yes [provider]  traMADol (ULTRAM) 50 MG tablet Take 1 tablet (50 mg total) by mouth every 6 (six) hours as needed. Patient taking differently: Take 50 mg by mouth every 6 (six) hours.  04/15/18  Yes Denita Lung, MD  TURMERIC PO Take 1,000 mg by mouth daily.   Yes [provider]  verapamil (CALAN-SR) 240 MG CR tablet TAKE 1  TABLET (240 MG TOTAL) BY MOUTH DAILY. 10/11/17  Yes [name], MD  vitamin B-12 (CYANOCOBALAMIN) 1000 MCG tablet Take 1,000 mcg by mouth 3 (three) times a week.    Yes [provider]    Physical Exam: Vitals:   04/18/18 0827 04/18/18 1017 04/18/18 1121  BP: (!) 163/112  (!) 186/109  Pulse: 67    Resp: 18  17  Temp: 97.7 F (36.5 C)  97.7 F (36.5 C)  TempSrc: Oral  Oral  SpO2:   98%  Weight:  76.9 kg     Constitutional: NAD, calm, comfortable Eyes: PERRL, lids and conjunctivae normal ENMT: Mucous membranes are moist. Posterior pharynx clear of any exudate or lesions.Normal dentition.  Neck: normal, supple, no masses, no thyromegaly Respiratory: clear to auscultation bilaterally, no wheezing, no crackles. Normal respiratory effort. No accessory muscle use.  Cardiovascular: Regular rate and rhythm, no murmurs / rubs / gallops. No extremity edema. 2+ pedal pulses.  Abdomen: Mild tenderness to palpation in the midepigastric area, no rebound or guarding, no bowel sounds.  Musculoskeletal: no clubbing / cyanosis. Normal muscle tone.  Skin: no rashes, lesions, ulcers. No induration Neurologic: CN 2-12 grossly intact. Strength 5/5 in all 4.  Psychiatric: Normal judgment and insight. Alert and  oriented x 3. Normal mood.   Labs on Admission: I have personally reviewed following labs and imaging studies  CBC: Recent Labs  Lab 04/18/18 0910  WBC 6.0  NEUTROABS 4.5  HGB 14.6  HCT 43.8  MCV 93.0  PLT 119   Basic Metabolic Panel: Recent Labs  Lab 04/18/18 0910  NA 139  K 4.1  CL 104  CO2 27  GLUCOSE 102*  BUN 20  CREATININE 1.21  CALCIUM 9.4   GFR: Estimated Creatinine Clearance: 56.5 mL/min (by C-G formula based on SCr of 1.21 mg/dL). Liver Function Tests: Recent Labs  Lab 04/18/18 0910  AST 16  ALT 9  ALKPHOS 57  BILITOT 0.9  PROT 8.0  ALBUMIN 4.5   Recent Labs  Lab 04/18/18 0910  LIPASE 29   No results for input(s): AMMONIA in the last 168 hours. Coagulation Profile: No results for input(s): INR, PROTIME in the last 168 hours. Cardiac Enzymes: No results for input(s): CKTOTAL, CKMB, CKMBINDEX, TROPONINI in the last 168 hours. BNP (last 3 results) No results for input(s): PROBNP in the last 8760 hours. HbA1C: No results for input(s): HGBA1C in the last 72 hours. CBG: No results for input(s): GLUCAP in the last 168 hours. Lipid Profile: No results for input(s): CHOL, HDL, LDLCALC, TRIG, CHOLHDL, LDLDIRECT in the last 72 hours. Thyroid Function Tests: No results for input(s): TSH, T4TOTAL, FREET4, T3FREE, THYROIDAB in the last 72 hours. Anemia Panel: No results for input(s): VITAMINB12, FOLATE, FERRITIN, TIBC, IRON, RETICCTPCT in the last 72 hours. Urine analysis:    Component Value Date/Time   COLORURINE YELLOW 04/18/2018 1019   APPEARANCEUR CLEAR 04/18/2018 1019   LABSPEC 1.013 04/18/2018 1019   LABSPEC 1.025 12/28/2017 1302   PHURINE 7.0 04/18/2018 1019   GLUCOSEU NEGATIVE 04/18/2018 1019   HGBUR NEGATIVE 04/18/2018 1019   BILIRUBINUR NEGATIVE 04/18/2018 1019   BILIRUBINUR negative 12/28/2017 1302   KETONESUR NEGATIVE 04/18/2018 1019   PROTEINUR NEGATIVE 04/18/2018 1019   UROBILINOGEN 0.2 03/14/2014 2107   NITRITE NEGATIVE  04/18/2018 1019   LEUKOCYTESUR NEGATIVE 04/18/2018 1019     Radiological Exams on Admission: Ct Abdomen Pelvis W Contrast  Result Date: 04/18/2018 CLINICAL DATA:  Acute mid to left lower abdominal pain  this morning. EXAM: CT ABDOMEN AND PELVIS WITH CONTRAST TECHNIQUE: Multidetector CT imaging of the abdomen and pelvis was performed using the standard protocol following bolus administration of intravenous contrast. CONTRAST:  150mL ISOVUE-300 IOPAMIDOL (ISOVUE-300) INJECTION 61% COMPARISON:  None. FINDINGS: Lower chest: Normal except for a small hiatal hernia. Hepatobiliary: No significant abnormality. Small focal area of fatty infiltration in the left lobe of the liver adjacent to the falciform ligament. Biliary tree is normal. Pancreas: Unremarkable. No pancreatic ductal dilatation or surrounding inflammatory changes. Spleen: Normal in size without focal abnormality. Adrenals/Urinary Tract: Slightly lobulated 5.8 cm cyst on the upper pole of the right kidney. 4.9 cm simple cyst in the midportion of the left kidney. Kidneys are otherwise normal. Adrenal glands, ureters, and bladder are normal. Stomach/Bowel: Small hiatal hernia. Slight edema in the mucosa of the distal antrum of the stomach without a definable ulceration. Multiple dilated loops of small bowel. Proximal and distal small bowel are not dilated. Terminal ileum and appendix are normal. Colon is normal except for a few diverticula in the sigmoid region. There is a linear radiodensity in the small bowel in the left mid abdomen which could represent a foreign body, best seen on image 36 of series 2. This does not appear to be at a transition point. Vascular/Lymphatic: Aortic atherosclerosis. No enlarged abdominal or pelvic lymph nodes. Reproductive: Prostate is unremarkable. Other: Small amount of ascites in the right pericolic gutter and in the pelvis. Small periumbilical hernia containing only fat. Musculoskeletal: No acute or significant osseous  findings. IMPRESSION: Mid small bowel obstruction suggesting a closed-loop obstruction. Proximal and distal small bowel are not dilated. Small amount of ascites. Critical Value/emergent results were called by telephone at the time of interpretation on 04/18/2018 at 11:01 am to Dr. Merrily Pew , who verbally acknowledged these results. Electronically Signed   By: Lorriane Shire M.D.   On: 04/18/2018 11:01     Assessment/Plan Active Problems:   SBO (small bowel obstruction) (HCC)    Small bowel obstruction -CT scan reads a closed-loop small bowel obstruction.  Appreciate general surgery consultation.  He appears nontoxic, has no leukocytosis, afebrile. -Hold off on placing emergent NG tube up until surgery sees him as patient has no nausea or vomiting  Parkinsonian tremor -Continue home Sinemet, appears to be tolerating p.o. right now  Hypertension -Attempt p.o. meds, IV hydralazine as needed   For hyperlipidemia, BPH, hold nonessential medications   DVT prophylaxis: SCDs Code Status: Full code Family Communication: Wife present at bedside Disposition Plan: Home when ready Consults called: General surgery    Admission status: Inpatient   At the time of admission, it appears that the appropriate admission status for this patient is INPATIENT. This is judged to be reasonable and necessary in order to provide the required high service intensity to ensure the patient's safety given the presenting symptoms, physical exam findings, and initial radiographic and laboratory data in the context of their chronic comorbidities. Current circumstances are small bowel obstruction, and it is felt to place patient at high risk for further clinical deterioration threatening life, limb, or organ. Moreover, it is my clinical judgment that the patient will require inpatient hospital care spanning beyond 2 midnights from the point of admission and that early discharge would result in unnecessary risk of  decompensation and readmission or threat to life, limb or bodily function.   Marzetta Board, MD [facility] Pager [phone]  If 7PM-7AM, please contact night-coverage [email or web address] Password [facility]  04/18/2018, 11:56  AM

## 2018-04-18 NOTE — ED Triage Notes (Signed)
Pt c/o mid abd pains that he woke up with this morning. Denies n/v/d or urinary problems.

## 2018-04-18 NOTE — ED Notes (Signed)
ED Provider at bedside. EDP MESNER 

## 2018-04-18 NOTE — ED Notes (Signed)
[place] [phone]

## 2018-04-18 NOTE — ED Notes (Signed)
CULTURE COLLECTED

## 2018-04-18 NOTE — ED Notes (Signed)
ADMISSION Provider at bedside. 

## 2018-04-18 NOTE — ED Notes (Signed)
CT CAME BY HOWEVER PT IN BATHROOM. WILL COME BACK

## 2018-04-18 NOTE — ED Notes (Signed)
ED TO INPATIENT HANDOFF REPORT  Name/Age/Gender [name] 67 y.o. male  Code Status   Home/SNF/Other Home  Chief Complaint lower abd pain   Level of Care/Admitting Diagnosis ED Disposition    ED Disposition Condition [facility] Area: [place] [[id number]]  Level of Care: Med-Surg [16]  Diagnosis: SBO (small bowel obstruction) [facility]) [[id number]]  Admitting Physician: Caren Griffins [id number]  Attending Physician: Caren Griffins [phone]  Estimated length of stay: past midnight tomorrow  Certification:: I certify this patient will need inpatient services for at least 2 midnights  PT Class (Do Not Modify): Inpatient [101]  PT Acc Code (Do Not Modify): Private [1]       Medical History Past Medical History:  Diagnosis Date  . Allergy   . Arthritis   . BPH (benign prostatic hyperplasia)   . Complication of anesthesia    Pt. stated he had a reaction that ended in him requiring urinary cath placement  . Diverticulosis   . GERD (gastroesophageal reflux disease)    esophageal spasms  . Glaucoma   . Gout   . Head injury, closed, with concussion   . Hepatitis C    chronic - Has been treated with Harvoni  . HLD (hyperlipidemia)    statin intolerant (Crestor & Simvastatin) - Taking Livalo 23m / week  . Hypertension   . Parkinson's disease ([place]   . Plantar fasciitis    right  . PVD (peripheral vascular disease) ([place]    With no claudication; only mild abdominal aortic atherosclerosis noted on ultrasound.  . Thoracic ascending aortic aneurysm (HCC)    4.2 cm ascending TAA 09/2016 CT, 1 yr f/u rec  . Ulcer     Allergies No Known Allergies  IV Location/Drains/Wounds Patient Lines/Drains/Airways Status   Active Line/Drains/Airways    Name:   Placement date:   Placement time:   Site:   Days:   Peripheral IV 04/18/18 Left Antecubital   04/18/18    0904    Antecubital   less than 1   Incision (Closed) 09/01/17 Foot Right   09/01/17     0838     229          Labs/Imaging Results for orders placed or performed during the hospital encounter of 04/18/18 (from the past 48 hour(s))  CBC with Differential     Status: None   Collection Time: 04/18/18  9:10 AM  Result Value Ref Range   WBC 6.0 4.0 - 10.5 K/uL   RBC 4.71 4.22 - 5.81 MIL/uL   Hemoglobin 14.6 13.0 - 17.0 g/dL   HCT 43.8 39.0 - 52.0 %   MCV 93.0 78.0 - 100.0 fL   MCH 31.0 26.0 - 34.0 pg   MCHC 33.3 30.0 - 36.0 g/dL   RDW 13.1 11.5 - 15.5 %   Platelets 264 150 - 400 K/uL   Neutrophils Relative % 74 %   Neutro Abs 4.5 1.7 - 7.7 K/uL   Lymphocytes Relative 15 %   Lymphs Abs 0.9 0.7 - 4.0 K/uL   Monocytes Relative 9 %   Monocytes Absolute 0.6 0.1 - 1.0 K/uL   Eosinophils Relative 1 %   Eosinophils Absolute 0.1 0.0 - 0.7 K/uL   Basophils Relative 1 %   Basophils Absolute 0.0 0.0 - 0.1 K/uL    Comment: Performed at [facility] [address] [place] [address] Comprehensive metabolic panel     Status: Abnormal  Collection Time: 04/18/18  9:10 AM  Result Value Ref Range   Sodium 139 135 - 145 mmol/L   Potassium 4.1 3.5 - 5.1 mmol/L   Chloride 104 98 - 111 mmol/L   CO2 27 22 - 32 mmol/L   Glucose, Bld 102 (H) 70 - 99 mg/dL   BUN 20 8 - 23 mg/dL   Creatinine, Ser 1.21 0.61 - 1.24 mg/dL   Calcium 9.4 8.9 - 10.3 mg/dL   Total Protein 8.0 6.5 - 8.1 g/dL   Albumin 4.5 3.5 - 5.0 g/dL   AST 16 15 - 41 U/L   ALT 9 0 - 44 U/L   Alkaline Phosphatase 57 38 - 126 U/L   Total Bilirubin 0.9 0.3 - 1.2 mg/dL   GFR calc non Af Amer 60 (L) >60 mL/min   GFR calc Af Amer >60 >60 mL/min    Comment: (NOTE) The eGFR has been calculated using the CKD EPI equation. This calculation has not been validated in all clinical situations. eGFR's persistently <60 mL/min signify possible Chronic Kidney Disease.    Anion gap 8 5 - 15    Comment: Performed at [facility], [place] [address] [place], [place] [address]  Lipase,  blood     Status: None   Collection Time: 04/18/18  9:10 AM  Result Value Ref Range   Lipase 29 11 - 51 U/L    Comment: Performed at [facility], [place] [address], [place], [place] [address]  Urinalysis, Routine w reflex microscopic     Status: None   Collection Time: 04/18/18 10:19 AM  Result Value Ref Range   Color, Urine YELLOW YELLOW   APPearance CLEAR CLEAR   Specific Gravity, Urine 1.013 1.005 - 1.030   pH 7.0 5.0 - 8.0   Glucose, UA NEGATIVE NEGATIVE mg/dL   Hgb urine dipstick NEGATIVE NEGATIVE   Bilirubin Urine NEGATIVE NEGATIVE   Ketones, ur NEGATIVE NEGATIVE mg/dL   Protein, ur NEGATIVE NEGATIVE mg/dL   Nitrite NEGATIVE NEGATIVE   Leukocytes, UA NEGATIVE NEGATIVE    Comment: Performed at [facility] [address] [place], [place] [address]  Lactic acid, plasma     Status: None   Collection Time: 04/18/18 11:09 AM  Result Value Ref Range   Lactic Acid, Venous 1.2 0.5 - 1.9 mmol/L    Comment: Performed at [facility], [place] [address], [place], [place] [address]   Ct Abdomen Pelvis W Contrast  Result Date: 04/18/2018 CLINICAL DATA:  Acute mid to left lower abdominal pain this morning. EXAM: CT ABDOMEN AND PELVIS WITH CONTRAST TECHNIQUE: Multidetector CT imaging of the abdomen and pelvis was performed using the standard protocol following bolus administration of intravenous contrast. CONTRAST:  148m ISOVUE-300 IOPAMIDOL (ISOVUE-300) INJECTION 61% COMPARISON:  None. FINDINGS: Lower chest: Normal except for a small hiatal hernia. Hepatobiliary: No significant abnormality. Small focal area of fatty infiltration in the left lobe of the liver adjacent to the falciform ligament. Biliary tree is normal. Pancreas: Unremarkable. No pancreatic ductal dilatation or surrounding inflammatory changes. Spleen: Normal in size without focal abnormality. Adrenals/Urinary Tract: Slightly lobulated 5.8 cm cyst on the upper pole of the right  kidney. 4.9 cm simple cyst in the midportion of the left kidney. Kidneys are otherwise normal. Adrenal glands, ureters, and bladder are normal. Stomach/Bowel: Small hiatal hernia. Slight edema in the mucosa of the distal antrum of the stomach without a definable ulceration. Multiple dilated loops of small bowel. Proximal and  distal small bowel are not dilated. Terminal ileum and appendix are normal. Colon is normal except for a few diverticula in the sigmoid region. There is a linear radiodensity in the small bowel in the left mid abdomen which could represent a foreign body, best seen on image 36 of series 2. This does not appear to be at a transition point. Vascular/Lymphatic: Aortic atherosclerosis. No enlarged abdominal or pelvic lymph nodes. Reproductive: Prostate is unremarkable. Other: Small amount of ascites in the right pericolic gutter and in the pelvis. Small periumbilical hernia containing only fat. Musculoskeletal: No acute or significant osseous findings. IMPRESSION: Mid small bowel obstruction suggesting a closed-loop obstruction. Proximal and distal small bowel are not dilated. Small amount of ascites. Critical Value/emergent results were called by telephone at the time of interpretation on 04/18/2018 at 11:01 am to Dr. Merrily Pew , who verbally acknowledged these results. Electronically Signed   By: Lorriane Shire M.D.   On: 04/18/2018 11:01    Pending Labs Unresulted Labs (From admission, onward)    Start     Ordered   04/18/18 1109  Lactic acid, plasma  Now then every 2 hours,   STAT     04/18/18 1108   Signed and Held  HIV antibody (Routine Testing)  Once,   R     Signed and Held   Signed and Held  Comprehensive metabolic panel  Tomorrow morning,   R     Signed and Held   Signed and Held  CBC  Tomorrow morning,   R     Signed and Held          Vitals/Pain Today's Vitals   04/18/18 1301 04/18/18 1314 04/18/18 1322 04/18/18 1326  BP: (!) 163/120 (!) 185/126  (!) 169/116   Pulse: 84 69  75  Resp:  16  16  Temp:  98 F (36.7 C)    TempSrc:  Oral    SpO2: 100% 98%  100%  Weight:      PainSc:   5      Isolation Precautions No active isolations  Medications Medications  iopamidol (ISOVUE-300) 61 % injection (has no administration in time range)  verapamil (CALAN-SR) CR tablet 240 mg (has no administration in time range)  hydrALAZINE (APRESOLINE) injection 5 mg (5 mg Intravenous Given 04/18/18 1319)  morphine 2 MG/ML injection 2-4 mg (2 mg Intravenous Given 04/18/18 1313)  magnesium citrate solution 1 Bottle (1 Bottle Oral Given 04/18/18 0912)  lactated ringers bolus 1,000 mL (0 mLs Intravenous Stopped 04/18/18 1008)  iopamidol (ISOVUE-300) 61 % injection 100 mL (100 mLs Intravenous Contrast Given 04/18/18 1021)  fentaNYL (SUBLIMAZE) injection 50 mcg (50 mcg Intravenous Given 04/18/18 1149)    Mobility walks

## 2018-04-18 NOTE — Progress Notes (Signed)
Attempted to call for report, nurse will have to call me back.

## 2018-04-18 NOTE — Consult Note (Signed)
[facility] Surgery Consult/Admission Note  [name] 12-Mar-1951  [id number].    Requesting MD: Dr. Dayna Barker Chief Complaint/Reason for Consult: SBO  HPI:   Pt is a 67 yo male with a hx of HTN, osteoarthritis, upper GI bleed from ulcer, parkinsonian tremor, BPH, HLD, legally blind in R eye, who presented to the ED with complaints of abdominal pain. He woke this am with intermittent crampy, achy lower abdominal pain that progressively worsened to constant, severe and non radiating. Pt tried aloe vera juice and tums without relief. Pt had a BM 30 minutes ago and flatus once since arrival to the ED. Pt denies nausea, vomiting, fever, chills, CP, SOB. Pt denies previous abdominal surgeries or anticoagulation. Pt states he has a small abdominal hernia.   ED Course: CT abd/pel showed possible closed loop SBO.   ROS:  Review of Systems  Constitutional: Negative for chills, diaphoresis and fever.  HENT: Negative for sore throat.   Respiratory: Negative for cough and shortness of breath.   Cardiovascular: Negative for chest pain.  Gastrointestinal: Positive for abdominal pain. Negative for blood in stool, constipation, diarrhea, melena, nausea and vomiting.  Genitourinary: Negative for dysuria.  Skin: Negative for rash.  Neurological: Negative for dizziness and loss of consciousness.  All other systems reviewed and are negative.    Family History  Problem Relation Age of Onset  . Heart disease Mother   . Cancer Paternal Grandfather   . Cancer Sister     Past Medical History:  Diagnosis Date  . Allergy   . Arthritis   . BPH (benign prostatic hyperplasia)   . Complication of anesthesia    Pt. stated he had a reaction that ended in him requiring urinary cath placement  . Diverticulosis   . GERD (gastroesophageal reflux disease)    esophageal spasms  . Glaucoma   . Gout   . Head injury, closed, with concussion   . Hepatitis C    chronic - Has been treated with Harvoni   . HLD (hyperlipidemia)    statin intolerant (Crestor & Simvastatin) - Taking Livalo 28m / week  . Hypertension   . Parkinson's disease ([place]   . Plantar fasciitis    right  . PVD (peripheral vascular disease) ([place]    With no claudication; only mild abdominal aortic atherosclerosis noted on ultrasound.  . Thoracic ascending aortic aneurysm (HCC)    4.2 cm ascending TAA 09/2016 CT, 1 yr f/u rec  . Ulcer     Past Surgical History:  Procedure Laterality Date  . CARDIAC CATHETERIZATION  2005   30% Cx. Dr. GMelvern Banker . cataract surgery Left 11/05/2014  . COLONOSCOPY    . ESOPHAGOGASTRODUODENOSCOPY N/A 05/02/2015   Procedure: ESOPHAGOGASTRODUODENOSCOPY (EGD);  Surgeon: RRonald Lobo MD;  [facility];  Service: Endoscopy;  Laterality: N/A;  . ESOPHAGOGASTRODUODENOSCOPY  05/02/2015   no source of pt chest pain endoscopically evident. small hiatal hernia.  .Marland KitchenEYE SURGERY    . MEMBRANE PEEL Left 03/14/2014   Procedure: MEMBRANE PEEL; ENDOLASER;  Surgeon: GHurman Horn MD;  [place]  Service: Ophthalmology;  Laterality: Left;  . NM MYOVIEW LTD  2011   Neg Ischemia or infarct.  .Marland KitchenNM MYOVIEW LTD  10/2015   LOW RISK. Small, fixed basal lateral defect - likely diaphragmatic attenuation. EF 69%  . PARS PLANA VITRECTOMY Left 03/14/2014   Procedure: PARS PLANA VITRECTOMY WITH 25 GAUGE;  Surgeon: GHurman Horn MD;  [place] [facility]  Service: Ophthalmology;  Laterality: Left;  . TONSILLECTOMY    . TRANSTHORACIC ECHOCARDIOGRAM  2013   Normal EF. No significant Valve Disease  . UPPER GASTROINTESTINAL ENDOSCOPY    . WEIL OSTEOTOMY Right 09/01/2017   Procedure: RIGHT GREAT TOE CHEVRON AND WEIL OSTEOTOMY 2ND METATARSAL;  Surgeon: Newt Minion, MD;  Location: [facility];  Service: Orthopedics;  Laterality: Right;    Social History:  reports that he quit smoking about 29 years ago. His smoking use included cigars. He has never used smokeless tobacco. He reports that he drinks about 3.0  standard drinks of alcohol per week. He reports that he does not use drugs.  Allergies: No Known Allergies   (Not in a hospital admission)  Blood pressure (!) 157/112, pulse 73, temperature 97.7 F (36.5 C), temperature source Oral, resp. rate 17, weight 76.9 kg, SpO2 99 %.  Physical Exam  Constitutional: He is oriented to person, place, and time. He appears well-developed and well-nourished.  Non-toxic appearance. He does not appear ill. No distress.  HENT:  Head: Normocephalic and atraumatic.  Nose: Nose normal.  Mouth/Throat: Mucous membranes are normal.  Eyes: Conjunctivae are normal. Right eye exhibits no discharge. Left eye exhibits no discharge. No scleral icterus.  R pupil larger than left, both reactive to light  Neck: Normal range of motion. Neck supple. No thyromegaly present.  Cardiovascular: Normal rate, regular rhythm, normal heart sounds and intact distal pulses.  No murmur heard. Pulses:      Radial pulses are 2+ on the right side, and 2+ on the left side.       Dorsalis pedis pulses are 2+ on the right side, and 2+ on the left side.  Pulmonary/Chest: Effort normal and breath sounds normal. No respiratory distress. He has no wheezes. He has no rhonchi. He has no rales.  Abdominal: Soft. Normal appearance. He exhibits no distension. Bowel sounds are decreased. There is no hepatosplenomegaly. There is generalized tenderness. There is no rigidity and no guarding.  Small periumbilical hernia noted. No peritonitis   Musculoskeletal: Normal range of motion. He exhibits no edema, tenderness or deformity.  Lymphadenopathy:    He has no cervical adenopathy.  Neurological: He is alert and oriented to person, place, and time.  Skin: Skin is warm and dry. No rash noted. He is not diaphoretic.  Psychiatric: He has a normal mood and affect.  Nursing note and vitals reviewed.   Results for orders placed or performed during the hospital encounter of 04/18/18 (from the past 48  hour(s))  CBC with Differential     Status: None   Collection Time: 04/18/18  9:10 AM  Result Value Ref Range   WBC 6.0 4.0 - 10.5 K/uL   RBC 4.71 4.22 - 5.81 MIL/uL   Hemoglobin 14.6 13.0 - 17.0 g/dL   HCT 43.8 39.0 - 52.0 %   MCV 93.0 78.0 - 100.0 fL   MCH 31.0 26.0 - 34.0 pg   MCHC 33.3 30.0 - 36.0 g/dL   RDW 13.1 11.5 - 15.5 %   Platelets 264 150 - 400 K/uL   Neutrophils Relative % 74 %   Neutro Abs 4.5 1.7 - 7.7 K/uL   Lymphocytes Relative 15 %   Lymphs Abs 0.9 0.7 - 4.0 K/uL   Monocytes Relative 9 %   Monocytes Absolute 0.6 0.1 - 1.0 K/uL   Eosinophils Relative 1 %   Eosinophils Absolute 0.1 0.0 - 0.7 K/uL   Basophils Relative 1 %   Basophils Absolute 0.0 0.0 -  0.1 K/uL    Comment: Performed at [facility], [place] [address] [place], [place] [address]  Comprehensive metabolic panel     Status: Abnormal   Collection Time: 04/18/18  9:10 AM  Result Value Ref Range   Sodium 139 135 - 145 mmol/L   Potassium 4.1 3.5 - 5.1 mmol/L   Chloride 104 98 - 111 mmol/L   CO2 27 22 - 32 mmol/L   Glucose, Bld 102 (H) 70 - 99 mg/dL   BUN 20 8 - 23 mg/dL   Creatinine, Ser 1.21 0.61 - 1.24 mg/dL   Calcium 9.4 8.9 - 10.3 mg/dL   Total Protein 8.0 6.5 - 8.1 g/dL   Albumin 4.5 3.5 - 5.0 g/dL   AST 16 15 - 41 U/L   ALT 9 0 - 44 U/L   Alkaline Phosphatase 57 38 - 126 U/L   Total Bilirubin 0.9 0.3 - 1.2 mg/dL   GFR calc non Af Amer 60 (L) >60 mL/min   GFR calc Af Amer >60 >60 mL/min    Comment: (NOTE) The eGFR has been calculated using the CKD EPI equation. This calculation has not been validated in all clinical situations. eGFR's persistently <60 mL/min signify possible Chronic Kidney Disease.    Anion gap 8 5 - 15    Comment: Performed at [facility], [place] [address] [place], [place] [address]  Lipase, blood     Status: None   Collection Time: 04/18/18  9:10 AM  Result Value Ref Range   Lipase 29 11 - 51 U/L    Comment: Performed at [facility], [place] [address] [place]  [address]  Urinalysis, Routine w reflex microscopic     Status: None   Collection Time: 04/18/18 10:19 AM  Result Value Ref Range   Color, Urine YELLOW YELLOW   APPearance CLEAR CLEAR   Specific Gravity, Urine 1.013 1.005 - 1.030   pH 7.0 5.0 - 8.0   Glucose, UA NEGATIVE NEGATIVE mg/dL   Hgb urine dipstick NEGATIVE NEGATIVE   Bilirubin Urine NEGATIVE NEGATIVE   Ketones, ur NEGATIVE NEGATIVE mg/dL   Protein, ur NEGATIVE NEGATIVE mg/dL   Nitrite NEGATIVE NEGATIVE   Leukocytes, UA NEGATIVE NEGATIVE    Comment: Performed at [place] [address] [place], [place] [address]  Lactic acid, plasma     Status: None   Collection Time: 04/18/18 11:09 AM  Result Value Ref Range   Lactic Acid, Venous 1.2 0.5 - 1.9 mmol/L    Comment: Performed at [facility], [place] [address], [place], [place] [address]   Ct Abdomen Pelvis W Contrast  Result Date: 04/18/2018 CLINICAL DATA:  Acute mid to left lower abdominal pain this morning. EXAM: CT ABDOMEN AND PELVIS WITH CONTRAST TECHNIQUE: Multidetector CT imaging of the abdomen and pelvis was performed using the standard protocol following bolus administration of intravenous contrast. CONTRAST:  144m ISOVUE-300 IOPAMIDOL (ISOVUE-300) INJECTION 61% COMPARISON:  None. FINDINGS: Lower chest: Normal except for a small hiatal hernia. Hepatobiliary: No significant abnormality. Small focal area of fatty infiltration in the left lobe of the liver adjacent to the falciform ligament. Biliary tree is normal. Pancreas: Unremarkable. No pancreatic ductal dilatation or surrounding inflammatory changes. Spleen: Normal in size without focal abnormality. Adrenals/Urinary Tract: Slightly lobulated 5.8 cm cyst on the upper pole of the right kidney. 4.9 cm simple cyst in the midportion of the left kidney. Kidneys are otherwise normal. Adrenal glands, ureters, and bladder  are normal.  Stomach/Bowel: Small hiatal hernia. Slight edema in the mucosa of the distal antrum of the stomach without a definable ulceration. Multiple dilated loops of small bowel. Proximal and distal small bowel are not dilated. Terminal ileum and appendix are normal. Colon is normal except for a few diverticula in the sigmoid region. There is a linear radiodensity in the small bowel in the left mid abdomen which could represent a foreign body, best seen on image 36 of series 2. This does not appear to be at a transition point. Vascular/Lymphatic: Aortic atherosclerosis. No enlarged abdominal or pelvic lymph nodes. Reproductive: Prostate is unremarkable. Other: Small amount of ascites in the right pericolic gutter and in the pelvis. Small periumbilical hernia containing only fat. Musculoskeletal: No acute or significant osseous findings. IMPRESSION: Mid small bowel obstruction suggesting a closed-loop obstruction. Proximal and distal small bowel are not dilated. Small amount of ascites. Critical Value/emergent results were called by telephone at the time of interpretation on 04/18/2018 at 11:01 am to Dr. Merrily Pew , who verbally acknowledged these results. Electronically Signed   By: Lorriane Shire M.D.   On: 04/18/2018 11:01      Assessment/Plan Active Problems:   SBO (small bowel obstruction) (HCC)  SBO - No history of abdominal surgeries, possible cause may be adhesive band from periumbilical hernia that per CT is only containing fat - pt does not appear clinically obstructed, had small BM and one episode of flatus this am - SBO protocol without NGT - if pt has any vomiting he will need NGT placed  FEN: NPO VTE: SCD's, per medicine ID: none Foley: none Follow up: TBD  Plan: SBO protocol, hopefully this resolves without the need for surgery. We will follow. Thank you for the consult.   Pt seen along with Dr. Barry Dienes.    Kalman Drape, [facility] Surgery 04/18/2018, 12:24 PM Pager:  [phone] Consults: [phone] Mon-Fri 7:00 am-4:30 pm Sat-Sun 7:00 am-11:30 am

## 2018-04-18 NOTE — ED Provider Notes (Signed)
Emergency Department Provider Note   I have reviewed the triage vital signs and the nursing notes.   HISTORY  Chief Complaint Abdominal Pain   HPI [name] is a 67 y.o. male with multiple medical problems documented below the presents to the emergency department today with left-sided pain.  Patient states that he also has back pain and is recently started increasing his dose of his Ultram for approximately the last 5 days.  He taken two a day in lieu of getting a steroid shot this week some time.  He states that he has had some increased constipation over the last couple days and today started having significant left-sided abdominal pain.  Patient states it does not radiate.  He has no nausea or vomiting.  No fevers.  No blood in his stool.  Has had a bowel movement yet today.  Has not been passing gas today but has had burping.  No urinary symptoms.  No rashes.  No trauma.  No history of surgery on his abdomen. No other recent medication changes.  No other associated or modifying symptoms.    Past Medical History:  Diagnosis Date  . Allergy   . Arthritis   . BPH (benign prostatic hyperplasia)   . Complication of anesthesia    Pt. stated he had a reaction that ended in him requiring urinary cath placement  . Diverticulosis   . GERD (gastroesophageal reflux disease)    esophageal spasms  . Glaucoma   . Gout   . Head injury, closed, with concussion   . Hepatitis C    chronic - Has been treated with Harvoni  . HLD (hyperlipidemia)    statin intolerant (Crestor & Simvastatin) - Taking Livalo 1mg  / week  . Hypertension   . Parkinson's disease ([place])   . Plantar fasciitis    right  . PVD (peripheral vascular disease) ([place])    With no claudication; only mild abdominal aortic atherosclerosis noted on ultrasound.  . Thoracic ascending aortic aneurysm (HCC)    4.2 cm ascending TAA 09/2016 CT, 1 yr f/u rec  . Ulcer     Patient Active Problem List   Diagnosis Date Noted  .  SBO (small bowel obstruction) ([place]) 04/18/2018  . DOE (dyspnea on exertion) 09/27/2017  . Bunion of great toe of right foot 07/14/2017  . Claw toe, acquired, right 07/14/2017  . 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  . Medication management 10/09/2016  . Plantar fasciitis of right foot 02/19/2015  . Depression 08/14/2014  . Family history of heart disease in male family member before age 27 04/26/2014  . Mild cognitive impairment 03/13/2014  . Parkinsonian tremor ([place]) 02/12/2014  . Hyperlipidemia with target LDL less than 100   . Abdominal aortic atherosclerosis ([place])   . Moderate essential hypertension 02/25/2011  . Arthropathy 02/25/2011  . HAV (hallux abducto valgus) 02/25/2011    Past Surgical History:  Procedure Laterality Date  . CARDIAC CATHETERIZATION  2005   30% Cx. Dr. Melvern Banker  . cataract surgery Left 11/05/2014  . COLONOSCOPY    . ESOPHAGOGASTRODUODENOSCOPY N/A 05/02/2015   Procedure: ESOPHAGOGASTRODUODENOSCOPY (EGD);  Surgeon: Ronald Lobo, MD;  [facility];  Service: Endoscopy;  Laterality: N/A;  . ESOPHAGOGASTRODUODENOSCOPY  05/02/2015   no source of pt chest pain endoscopically evident. small hiatal hernia.  [name] EYE SURGERY    . MEMBRANE PEEL Left 03/14/2014   Procedure: MEMBRANE PEEL; ENDOLASER;  Surgeon: Hurman Horn, MD;  Location: [facility];  Service: Ophthalmology;  Laterality: Left;  . NM MYOVIEW LTD  2011   Neg Ischemia or infarct.  [name] NM MYOVIEW LTD  10/2015   LOW RISK. Small, fixed basal lateral defect - likely diaphragmatic attenuation. EF 69%  . PARS PLANA VITRECTOMY Left 03/14/2014   Procedure: PARS PLANA VITRECTOMY WITH 25 GAUGE;  Surgeon: Hurman Horn, MD;  Location: [facility];  Service: Ophthalmology;  Laterality: Left;  . TONSILLECTOMY    . TRANSTHORACIC ECHOCARDIOGRAM  2013   Normal EF. No significant Valve Disease  . UPPER GASTROINTESTINAL ENDOSCOPY    . WEIL OSTEOTOMY Right 09/01/2017   Procedure: RIGHT  GREAT TOE CHEVRON AND WEIL OSTEOTOMY 2ND METATARSAL;  Surgeon: Newt Minion, MD;  Location: [facility];  Service: Orthopedics;  Laterality: Right;    Current Outpatient Rx  . Order #: [id number] Class: Historical Med  . Order #: [id number] Class: Historical Med  . Order #: [id number] Class: Normal  . Order #: [id number] Class: Normal  . Order #: [id number] Class: Fax  . Order #: [id number] Class: Historical Med  . Order #: [id number] Class: Normal  . Order #: [id number] Class: Normal  . Order #: [id number] Class: Historical Med  . Order #: [id number] Class: Normal  . Order #: [id number] Class: Historical Med  . Order #: [id number] Class: Historical Med  . Order #: [id number] Class: Normal  . Order #: [id number] Class: Historical Med  . Order #: [id number] Class: Normal  . Order #: [id number] Class: Historical Med    Allergies Patient has no known allergies.  Family History  Problem Relation Age of Onset  . Heart disease Mother   . Cancer Paternal Grandfather   . Cancer Sister     Social History Social History   Tobacco Use  . Smoking status: Former Smoker    Types: Cigars    Last attempt to quit: 07/27/1988    Years since quitting: 29.7  . Smokeless tobacco: Never Used  Substance Use Topics  . Alcohol use: Yes    Alcohol/week: 3.0 standard drinks    Types: 1 Glasses of wine, 1 Cans of beer, 1 Shots of liquor per week    Comment: occasional  . Drug use: No    Review of Systems  All other systems negative except as documented in the HPI. All pertinent positives and negatives as reviewed in the HPI. ____________________________________________   PHYSICAL EXAM:  VITAL SIGNS: Blood pressure (!) 163/112, pulse 67, temperature 97.7 F (36.5 C), temperature source Oral, resp. rate 18.   Constitutional: Alert and oriented. Well appearing and in no acute distress. Eyes: Conjunctivae are normal. PERRL. EOMI. Head: Atraumatic. Nose: No congestion/rhinnorhea. Mouth/Throat: Mucous membranes are moist.   Oropharynx non-erythematous. Neck: No stridor.  No meningeal signs.   Cardiovascular: Normal rate, regular rhythm. Good peripheral circulation. Grossly normal heart sounds.   Respiratory: Normal respiratory effort.  No retractions. Lungs CTAB. Gastrointestinal: Soft and ttp in LUQ, no rebound, guarding or peritonitis. No distention.  Musculoskeletal: No lower extremity tenderness nor edema. No gross deformities of extremities. Neurologic:  Normal speech and language. No gross focal neurologic deficits are appreciated.  Skin:  Skin is warm, dry and intact. No rash noted.   ____________________________________________   LABS (all labs ordered are listed, but only abnormal results are displayed)  Labs Reviewed  COMPREHENSIVE METABOLIC PANEL - Abnormal; Notable for the following components:      Result Value   Glucose, Bld 102 (*)    GFR calc non Af Amer 60 (*)    All other components within normal limits  CBC WITH DIFFERENTIAL/PLATELET  LIPASE, BLOOD  URINALYSIS, ROUTINE W REFLEX MICROSCOPIC  LACTIC ACID, PLASMA  LACTIC ACID, PLASMA   ____________________________________________  RADIOLOGY  Ct Abdomen Pelvis W Contrast  Result Date: 04/18/2018 CLINICAL DATA:  Acute mid to left lower abdominal pain this morning. EXAM: CT ABDOMEN AND PELVIS WITH CONTRAST TECHNIQUE: Multidetector CT imaging of the abdomen and pelvis was performed using the standard protocol following bolus administration of intravenous contrast. CONTRAST:  147mL ISOVUE-300 IOPAMIDOL (ISOVUE-300) INJECTION 61% COMPARISON:  None. FINDINGS: Lower chest: Normal except for a small hiatal hernia. Hepatobiliary: No significant abnormality. Small focal area of fatty infiltration in the left lobe of the liver adjacent to the falciform ligament. Biliary tree is normal. Pancreas: Unremarkable. No pancreatic ductal dilatation or surrounding inflammatory changes. Spleen: Normal in size without focal abnormality. Adrenals/Urinary  Tract: Slightly lobulated 5.8 cm cyst on the upper pole of the right kidney. 4.9 cm simple cyst in the midportion of the left kidney. Kidneys are otherwise normal. Adrenal glands, ureters, and bladder are normal. Stomach/Bowel: Small hiatal hernia. Slight edema in the mucosa of the distal antrum of the stomach without a definable ulceration. Multiple dilated loops of small bowel. Proximal and distal small bowel are not dilated. Terminal ileum and appendix are normal. Colon is normal except for a few diverticula in the sigmoid region. There is a linear radiodensity in the small bowel in the left mid abdomen which could represent a foreign body, best seen on image 36 of series 2. This does not appear to be at a transition point. Vascular/Lymphatic: Aortic atherosclerosis. No enlarged abdominal or pelvic lymph nodes. Reproductive: Prostate is unremarkable. Other: Small amount of ascites in the right pericolic gutter and in the pelvis. Small periumbilical hernia containing only fat. Musculoskeletal: No acute or significant osseous findings. IMPRESSION: Mid small bowel obstruction suggesting a closed-loop obstruction. Proximal and distal small bowel are not dilated. Small amount of ascites. Critical Value/emergent results were called by telephone at the time of interpretation on 04/18/2018 at 11:01 am to Dr. Merrily Pew , who verbally acknowledged these results. Electronically Signed   By: Lorriane Shire M.D.   On: 04/18/2018 11:01    ____________________________________________   INITIAL IMPRESSION / ASSESSMENT AND PLAN / ED COURSE  Could be just constipation but with decreased flatulence and ttp on exam, will eval for diverticulitis or obstruction. No n/v currently. Will also fluid hydrate and mg citrate.   Discussed with radiology who is concern for possible closed-loop small bowel obstruction.  I discussed with surgery who will see and evaluate but recommends medicine admission secondary to multiple medical  problems.  Discussed with medicine who will admit the prefers a dose of oral antihypertensive to make him stable for the floor. Home verapamil ordered.    Pertinent labs & imaging results that were available during my care of the patient were reviewed by me and considered in my medical decision making (see chart for details).  ____________________________________________  FINAL CLINICAL IMPRESSION(S) / ED DIAGNOSES  Final diagnoses:  Complete intestinal obstruction, unspecified cause ([place])     MEDICATIONS GIVEN DURING THIS VISIT:  Medications  iopamidol (ISOVUE-300) 61 % injection (has no administration in time range)  verapamil (CALAN-SR) CR tablet 240 mg (has no administration in time range)  hydrALAZINE (APRESOLINE) injection 5 mg (has no administration in time range)  morphine 2 MG/ML injection 2-4 mg (has no administration in time range)  magnesium citrate solution 1 Bottle (1 Bottle Oral Given 04/18/18 0912)  lactated ringers bolus 1,000 mL (0 mLs  Intravenous Stopped 04/18/18 1008)  iopamidol (ISOVUE-300) 61 % injection 100 mL (100 mLs Intravenous Contrast Given 04/18/18 1021)  fentaNYL (SUBLIMAZE) injection 50 mcg (50 mcg Intravenous Given 04/18/18 1149)     NEW OUTPATIENT MEDICATIONS STARTED DURING THIS VISIT:  Current Discharge Medication List      Note:  This note was prepared with assistance of Dragon voice recognition software. Occasional wrong-word or sound-a-like substitutions may have occurred due to the inherent limitations of voice recognition software.   Merrily Pew, MD 04/18/18 1218

## 2018-04-19 ENCOUNTER — Inpatient Hospital Stay (HOSPITAL_COMMUNITY): Payer: 59

## 2018-04-19 DIAGNOSIS — K56601 Complete intestinal obstruction, unspecified as to cause: Secondary | ICD-10-CM

## 2018-04-19 DIAGNOSIS — Z0189 Encounter for other specified special examinations: Secondary | ICD-10-CM

## 2018-04-19 LAB — COMPREHENSIVE METABOLIC PANEL
ALBUMIN: 3.7 g/dL (ref 3.5–5.0)
ALT: 6 U/L (ref 0–44)
ANION GAP: 6 (ref 5–15)
AST: 13 U/L — ABNORMAL LOW (ref 15–41)
Alkaline Phosphatase: 47 U/L (ref 38–126)
BUN: 19 mg/dL (ref 8–23)
CO2: 28 mmol/L (ref 22–32)
Calcium: 9.3 mg/dL (ref 8.9–10.3)
Chloride: 106 mmol/L (ref 98–111)
Creatinine, Ser: 1.05 mg/dL (ref 0.61–1.24)
GFR calc Af Amer: >60 mL/min (ref >60)
GFR calc non Af Amer: >60 mL/min (ref >60)
GLUCOSE: 113 mg/dL — AB (ref 70–99)
Potassium: 4.3 mmol/L (ref 3.5–5.1)
Sodium: 140 mmol/L (ref 135–145)
TOTAL PROTEIN: 6.8 g/dL (ref 6.5–8.1)
Total Bilirubin: 0.6 mg/dL (ref 0.3–1.2)

## 2018-04-19 LAB — HIV ANTIBODY (ROUTINE TESTING W REFLEX): HIV SCREEN 4TH GENERATION: NONREACTIVE

## 2018-04-19 LAB — CBC
HCT: 41.2 % (ref 39.0–52.0)
HEMOGLOBIN: 14 g/dL (ref 13.0–17.0)
MCH: 31.5 pg (ref 26.0–34.0)
MCHC: 34 g/dL (ref 30.0–36.0)
MCV: 92.6 fL (ref 78.0–100.0)
PLATELETS: 258 10*3/uL (ref 150–400)
RBC: 4.45 MIL/uL (ref 4.22–5.81)
RDW: 13.3 % (ref 11.5–15.5)
WBC: 9.1 10*3/uL (ref 4.0–10.5)

## 2018-04-19 MED ORDER — ACETAMINOPHEN 325 MG PO TABS
650.0000 mg | ORAL_TABLET | Freq: Four times a day (QID) | ORAL | Status: DC | PRN
  Administered 2018-04-19: 650 mg via ORAL
  Filled 2018-04-19: qty 2

## 2018-04-19 NOTE — Discharge Instructions (Signed)
Small Bowel Obstruction °A small bowel obstruction means that something is blocking the small bowel. The small bowel is also called the small intestine. It is the long tube that connects the stomach to the colon. An obstruction will stop food and fluids from passing through the small bowel. Treatment depends on what is causing the problem and how bad the problem is. °Follow these instructions at home: °· Get a lot of rest. °· Follow your diet as told by your doctor. You may need to: °? Only drink clear liquids until you start to get better. °? Avoid solid foods as told by your doctor. °· Take over-the-counter and prescription medicines only as told by your doctor. °· Keep all follow-up visits as told by your doctor. This is important. °Contact a doctor if: °· You have a fever. °· You have chills. °Get help right away if: °· You have pain or cramps that get worse. °· You throw up (vomit) blood. °· You have a feeling of being sick to your stomach (nausea) that does not go away. °· You cannot stop throwing up. °· You cannot drink fluids. °· You feel confused. °· You feel dry or thirsty (dehydrated). °· Your belly gets more bloated. °· You feel weak or you pass out (faint). °This information is not intended to replace advice given to you by your health care provider. Make sure you discuss any questions you have with your health care provider. °Document Released: 08/20/2004 Document Revised: 03/09/2016 Document Reviewed: 09/06/2014 °Elsevier Interactive Patient Education © 2018 Elsevier Inc. ° °

## 2018-04-19 NOTE — Discharge Summary (Signed)
Discharge Summary  AUDI WETTSTEIN TIR:[id number] DOB: [birth year]  PCP: Denita Lung, MD  Admit date: 04/18/2018 Discharge date: 04/19/2018  Time spent: 25 minutes  Recommendations for Outpatient Follow-up:  1. Follow-up with your primary care provider 2. Take your medications as prescribed  Discharge Diagnoses:  Active Hospital Problems   Diagnosis Date Noted  . SBO (small bowel obstruction) ([place]) 04/18/2018    Resolved Hospital Problems  No resolved problems to display.    Discharge Condition: Stable  Diet recommendation: Resume previous diet  Vitals:   04/19/18 0558 04/19/18 0921  BP: 124/85 (!) 159/97  Pulse: 75 68  Resp: 18 18  Temp: 98.5 F (36.9 C) 98.2 F (36.8 C)  SpO2: 95% 98%    History of present illness:  [name] is a 67 y.o. male with medical history significant of hypertension, osteoarthritis, parkinsonian tremor, BPH, hyperlipidemia, presents to the hospital with chief complaint of severe abdominal pain.  CT scan of the abdomen pelvis showed a closed loop small bowel obstruction. General surgery was consulted and [facility] asked to admit.  04/19/2018: Patient seen and examined at his bedside.  Reports bowel movement last night and this morning with positive flatus.  General surgery saw and no plan for any surgical intervention.  Started on soft diet.  Patient denies nausea.  States his abdominal pain is minimal with a rate of 1-2 out of 10.  On the day of discharge, the patient was hemodynamically stable.  He will need to follow-up with his primary care provider post hospitalization.  Hospital Course:  Active Problems:   SBO (small bowel obstruction) (HCC)  Resolved small bowel obstruction No plan for surgical intervention Started on soft diet No nausea, vomiting or significant abdominal pain. Self-reported bowel movements last night and this morning.  With positive flatus. No history of prior abdominal surgeries  Parkinsonian  tremor Continue home medications  Hypertension Continue home medications  Hyperlipidemia Continue home medications  BPH Continue home medications   Procedures:  None  Consultations:  General surgery  Discharge Exam: BP (!) 159/97 (BP Location: Left Arm)   Pulse 68   Temp 98.2 F (36.8 C) (Oral)   Resp 18   Ht 5\' 11"  (1.803 m)   Wt 76.9 kg Comment: pt was 170+ on 9/20 at Dr. Christianne Dolin office  SpO2 98%   BMI 23.65 kg/m  . General: 67 y.o. year-old male well developed well nourished in no acute distress.  Alert and oriented x3. . Cardiovascular: Regular rate and rhythm with no rubs or gallops.  No thyromegaly or JVD noted.   [name] Respiratory: Clear to auscultation with no wheezes or rales. Good inspiratory effort. . Abdomen: Soft nontender nondistended with normal bowel sounds x4 quadrants. . Musculoskeletal: No lower extremity edema. 2/4 pulses in all 4 extremities. . Skin: No ulcerative lesions noted or rashes . Psychiatry: Mood is appropriate for condition and setting  Discharge Instructions You were cared for by a hospitalist during your hospital stay. If you have any questions about your discharge medications or the care you received while you were in the hospital after you are discharged, you can call the unit and asked to speak with the hospitalist on call if the hospitalist that took care of you is not available. Once you are discharged, your primary care physician will handle any further medical issues. Please note that NO REFILLS for any discharge medications will be authorized once you are discharged, as it is imperative that you return to your  primary care physician (or establish a relationship with a primary care physician if you do not have one) for your aftercare needs so that they can reassess your need for medications and monitor your lab values.   Allergies as of 04/19/2018   No Known Allergies     Medication List    STOP taking these medications    benzonatate 200 MG capsule Commonly known as:  TESSALON   traMADol 50 MG tablet Commonly known as:  ULTRAM     TAKE these medications   ACETAMIN PO Take 500 mg by mouth every 4 (four) hours as needed (pain).   alfuzosin 10 MG 24 hr tablet Commonly known as:  UROXATRAL Take 10 mg by mouth at bedtime.   allopurinol 100 MG tablet Commonly known as:  ZYLOPRIM TAKE 1 TABLET BY MOUTH TWICE A DAY   carbidopa-levodopa 25-100 MG tablet Commonly known as:  SINEMET IR TAKE 1 AND 1/2 TABLET BY MOUTH 3 TIMES DAILY. What changed:    how much to take  how to take this  when to take this  additional instructions   diclofenac sodium 1 % Gel Commonly known as:  VOLTAREN Apply 2 g topically 4 (four) times daily. What changed:    when to take this  reasons to take this   ibuprofen 200 MG tablet Commonly known as:  ADVIL,MOTRIN Take 400 mg by mouth 2 (two) times daily as needed for headache or moderate pain.   irbesartan 150 MG tablet Commonly known as:  AVAPRO Take 1 tablet (150 mg total) by mouth every evening.   LIVALO 1 MG Tabs Generic drug:  Pitavastatin Calcium TAKE 1 TABLET BY MOUTH 2 TIMES A WEEK What changed:  See the new instructions.   multivitamin with minerals Tabs tablet Take 1 tablet by mouth 4 (four) times a week.   nitroGLYCERIN 0.4 MG SL tablet Commonly known as:  NITROSTAT PLACE 1 TABLET UNDER THE TONGUE EVERY 5 MINUTES AS NEEDED FOR CHEST PAIN. What changed:  See the new instructions.   pantoprazole 40 MG tablet Commonly known as:  PROTONIX Take 40 mg by mouth 2 (two) times daily.   timolol 0.5 % ophthalmic solution Commonly known as:  TIMOPTIC Place 1 drop into the left eye daily.   TURMERIC PO Take 1,000 mg by mouth daily.   verapamil 240 MG CR tablet Commonly known as:  CALAN-SR TAKE 1 TABLET (240 MG TOTAL) BY MOUTH DAILY.   vitamin B-12 1000 MCG tablet Commonly known as:  CYANOCOBALAMIN Take 1,000 mcg by mouth 3 (three) times a  week.      No Known Allergies Follow-up Information    Denita Lung, MD. Call in 1 day(s).   Specialty:  Family Medicine Why:  Please call for post hospital follow-up appointment. Contact information: [address] [place] [address] [phone]            The results of significant diagnostics from this hospitalization (including imaging, microbiology, ancillary and laboratory) are listed below for reference.    Significant Diagnostic Studies: Dg Abd 1 View  Result Date: 04/19/2018 CLINICAL DATA:  Follow up small bowel obstruction EXAM: ABDOMEN - 1 VIEW COMPARISON:  Film from the previous day. FINDINGS: 17 hour follow-up film now demonstrates all previously administered contrast to lie within the colon. No significant small bowel dilatation is identified on these images. No bony abnormality is seen. No free air is noted. IMPRESSION: Previously administered contrast now all lies within the colon consistent with a  partial small bowel obstruction. The previously seen dilated small bowel loops on CT are not well appreciated on this exam. Electronically Signed   By: Inez Catalina M.D.   On: 04/19/2018 09:50   Ct Abdomen Pelvis W Contrast  Result Date: 04/18/2018 CLINICAL DATA:  Acute mid to left lower abdominal pain this morning. EXAM: CT ABDOMEN AND PELVIS WITH CONTRAST TECHNIQUE: Multidetector CT imaging of the abdomen and pelvis was performed using the standard protocol following bolus administration of intravenous contrast. CONTRAST:  182mL ISOVUE-300 IOPAMIDOL (ISOVUE-300) INJECTION 61% COMPARISON:  None. FINDINGS: Lower chest: Normal except for a small hiatal hernia. Hepatobiliary: No significant abnormality. Small focal area of fatty infiltration in the left lobe of the liver adjacent to the falciform ligament. Biliary tree is normal. Pancreas: Unremarkable. No pancreatic ductal dilatation or surrounding inflammatory changes. Spleen: Normal in size without focal  abnormality. Adrenals/Urinary Tract: Slightly lobulated 5.8 cm cyst on the upper pole of the right kidney. 4.9 cm simple cyst in the midportion of the left kidney. Kidneys are otherwise normal. Adrenal glands, ureters, and bladder are normal. Stomach/Bowel: Small hiatal hernia. Slight edema in the mucosa of the distal antrum of the stomach without a definable ulceration. Multiple dilated loops of small bowel. Proximal and distal small bowel are not dilated. Terminal ileum and appendix are normal. Colon is normal except for a few diverticula in the sigmoid region. There is a linear radiodensity in the small bowel in the left mid abdomen which could represent a foreign body, best seen on image 36 of series 2. This does not appear to be at a transition point. Vascular/Lymphatic: Aortic atherosclerosis. No enlarged abdominal or pelvic lymph nodes. Reproductive: Prostate is unremarkable. Other: Small amount of ascites in the right pericolic gutter and in the pelvis. Small periumbilical hernia containing only fat. Musculoskeletal: No acute or significant osseous findings. IMPRESSION: Mid small bowel obstruction suggesting a closed-loop obstruction. Proximal and distal small bowel are not dilated. Small amount of ascites. Critical Value/emergent results were called by telephone at the time of interpretation on 04/18/2018 at 11:01 am to Dr. Merrily Pew , who verbally acknowledged these results. Electronically Signed   By: Lorriane Shire M.D.   On: 04/18/2018 11:01   Dg Abd Portable 1v-small Bowel Obstruction Protocol-initial, 8 Hr Delay  Result Date: 04/19/2018 CLINICAL DATA:  8 hour follow-up film for small-bowel obstruction EXAM: PORTABLE ABDOMEN - 1 VIEW COMPARISON:  CT from earlier in the same day FINDINGS: Previously administered contrast material now lies throughout the small bowel and extends into the proximal colon. These changes are consistent with a partial small bowel obstruction. IMPRESSION: Changes  consistent with partial small bowel obstruction with contrast material within the proximal colon. Electronically Signed   By: Inez Catalina M.D.   On: 04/19/2018 09:49    Microbiology: No results found for this or any previous visit (from the past 240 hour(s)).   Labs: Basic Metabolic Panel: Recent Labs  Lab 04/18/18 0910 04/19/18 0411  NA 139 140  K 4.1 4.3  CL 104 106  CO2 27 28  GLUCOSE 102* 113*  BUN 20 19  CREATININE 1.21 1.05  CALCIUM 9.4 9.3   Liver Function Tests: Recent Labs  Lab 04/18/18 0910 04/19/18 0411  AST 16 13*  ALT 9 6  ALKPHOS 57 47  BILITOT 0.9 0.6  PROT 8.0 6.8  ALBUMIN 4.5 3.7   Recent Labs  Lab 04/18/18 0910  LIPASE 29   No results for input(s): AMMONIA in the last  168 hours. CBC: Recent Labs  Lab 04/18/18 0910 04/19/18 0411  WBC 6.0 9.1  NEUTROABS 4.5  --   HGB 14.6 14.0  HCT 43.8 41.2  MCV 93.0 92.6  PLT 264 258   Cardiac Enzymes: No results for input(s): CKTOTAL, CKMB, CKMBINDEX, TROPONINI in the last 168 hours. BNP: BNP (last 3 results) No results for input(s): BNP in the last 8760 hours.  ProBNP (last 3 results) No results for input(s): PROBNP in the last 8760 hours.  CBG: No results for input(s): GLUCAP in the last 168 hours.     Signed:  Kayleen Memos, MD [facility] 04/19/2018, 10:58 AM

## 2018-04-19 NOTE — Progress Notes (Signed)
Pt alert, oriented, tolerating diet.  D/C instructions given, all questions answered. Pt was d/cd home. 

## 2018-04-19 NOTE — Progress Notes (Signed)
[facility]/Trauma Progress Note      Assessment/Plan SBO - No history of abdominal surgeries, possible cause may be adhesive band from periumbilical hernia that per CT is only containing fat - pt does not appear clinically obstructed, had small BM and one episode of flatus this am - SBO protocol without NGT - contrast seen in colon and pt had BM  FEN: CLD and advance to soft as tolerated VTE: SCD's, per medicine ID: none Foley: none Follow up: TBD  Plan: If pt can tolerate a soft diet he can be discharged from our standpoint.    LOS: 1 day    Subjective: CC: SBO  Mild abdominal pain before falling asleep last night but no pain since. No nausea, vomiting, fever, chills. Had a BM this am. Wife at bedside. Discussed return to ED precautions.   Objective: Vital signs in last 24 hours: Temp:  [97.7 F (36.5 C)-98.5 F (36.9 C)] 98.2 F (36.8 C) (09/24 0921) Pulse Rate:  [68-92] 68 (09/24 0921) Resp:  [16-18] 18 (09/24 0921) BP: (124-186)/(85-126) 159/97 (09/24 0921) SpO2:  [95 %-100 %] 98 % (09/24 0921) Weight:  [76.9 kg] 76.9 kg (09/23 1740) Last BM Date: 04/19/18  Intake/Output from previous day: 09/23 0701 - 09/24 0700 In: 2323.7 [P.O.:220; I.V.:1103.7; IV Piggyback:1000] Out: 1050 [Urine:1050] Intake/Output this shift: No intake/output data recorded.  PE: Gen:  Alert, NAD, pleasant, cooperative Pulm:  Rate and effort normal Abd: Soft, NT/ND, +BS, no HSM Skin: no rashes noted, warm and dry   Anti-infectives: Anti-infectives (From admission, onward)   None      Lab Results:  Recent Labs    04/18/18 0910 04/19/18 0411  WBC 6.0 9.1  HGB 14.6 14.0  HCT 43.8 41.2  PLT 264 258   BMET Recent Labs    04/18/18 0910 04/19/18 0411  NA 139 140  K 4.1 4.3  CL 104 106  CO2 27 28  GLUCOSE 102* 113*  BUN 20 19  CREATININE 1.21 1.05  CALCIUM 9.4 9.3   PT/INR No results for input(s): LABPROT, INR in the last 72 hours. CMP      Component Value Date/Time   NA 140 04/19/2018 0411   NA 137 09/29/2017 0927   K 4.3 04/19/2018 0411   CL 106 04/19/2018 0411   CO2 28 04/19/2018 0411   GLUCOSE 113 (H) 04/19/2018 0411   BUN 19 04/19/2018 0411   BUN 15 09/29/2017 0927   CREATININE 1.05 04/19/2018 0411   CREATININE 1.06 06/29/2014 0920   CALCIUM 9.3 04/19/2018 0411   PROT 6.8 04/19/2018 0411   PROT 7.6 09/29/2017 0927   ALBUMIN 3.7 04/19/2018 0411   ALBUMIN 4.5 09/29/2017 0927   AST 13 (L) 04/19/2018 0411   ALT 6 04/19/2018 0411   ALKPHOS 47 04/19/2018 0411   BILITOT 0.6 04/19/2018 0411   BILITOT 0.5 09/29/2017 0927   GFRNONAA >60 04/19/2018 0411   GFRAA >60 04/19/2018 0411   Lipase     Component Value Date/Time   LIPASE 29 04/18/2018 0910    Studies/Results: Dg Abd 1 View  Result Date: 04/19/2018 CLINICAL DATA:  Follow up small bowel obstruction EXAM: ABDOMEN - 1 VIEW COMPARISON:  Film from the previous day. FINDINGS: 17 hour follow-up film now demonstrates all previously administered contrast to lie within the colon. No significant small bowel dilatation is identified on these images. No bony abnormality is seen. No free air is noted. IMPRESSION: Previously administered contrast now all lies within the colon consistent with  a partial small bowel obstruction. The previously seen dilated small bowel loops on CT are not well appreciated on this exam. Electronically Signed   By: Inez Catalina M.D.   On: 04/19/2018 09:50   Ct Abdomen Pelvis W Contrast  Result Date: 04/18/2018 CLINICAL DATA:  Acute mid to left lower abdominal pain this morning. EXAM: CT ABDOMEN AND PELVIS WITH CONTRAST TECHNIQUE: Multidetector CT imaging of the abdomen and pelvis was performed using the standard protocol following bolus administration of intravenous contrast. CONTRAST:  140mL ISOVUE-300 IOPAMIDOL (ISOVUE-300) INJECTION 61% COMPARISON:  None. FINDINGS: Lower chest: Normal except for a small hiatal hernia. Hepatobiliary: No significant  abnormality. Small focal area of fatty infiltration in the left lobe of the liver adjacent to the falciform ligament. Biliary tree is normal. Pancreas: Unremarkable. No pancreatic ductal dilatation or surrounding inflammatory changes. Spleen: Normal in size without focal abnormality. Adrenals/Urinary Tract: Slightly lobulated 5.8 cm cyst on the upper pole of the right kidney. 4.9 cm simple cyst in the midportion of the left kidney. Kidneys are otherwise normal. Adrenal glands, ureters, and bladder are normal. Stomach/Bowel: Small hiatal hernia. Slight edema in the mucosa of the distal antrum of the stomach without a definable ulceration. Multiple dilated loops of small bowel. Proximal and distal small bowel are not dilated. Terminal ileum and appendix are normal. Colon is normal except for a few diverticula in the sigmoid region. There is a linear radiodensity in the small bowel in the left mid abdomen which could represent a foreign body, best seen on image 36 of series 2. This does not appear to be at a transition point. Vascular/Lymphatic: Aortic atherosclerosis. No enlarged abdominal or pelvic lymph nodes. Reproductive: Prostate is unremarkable. Other: Small amount of ascites in the right pericolic gutter and in the pelvis. Small periumbilical hernia containing only fat. Musculoskeletal: No acute or significant osseous findings. IMPRESSION: Mid small bowel obstruction suggesting a closed-loop obstruction. Proximal and distal small bowel are not dilated. Small amount of ascites. Critical Value/emergent results were called by telephone at the time of interpretation on 04/18/2018 at 11:01 am to Dr. Merrily Pew , who verbally acknowledged these results. Electronically Signed   By: Lorriane Shire M.D.   On: 04/18/2018 11:01   Dg Abd Portable 1v-small Bowel Obstruction Protocol-initial, 8 Hr Delay  Result Date: 04/19/2018 CLINICAL DATA:  8 hour follow-up film for small-bowel obstruction EXAM: PORTABLE ABDOMEN - 1  VIEW COMPARISON:  CT from earlier in the same day FINDINGS: Previously administered contrast material now lies throughout the small bowel and extends into the proximal colon. These changes are consistent with a partial small bowel obstruction. IMPRESSION: Changes consistent with partial small bowel obstruction with contrast material within the proximal colon. Electronically Signed   By: Inez Catalina M.D.   On: 04/19/2018 09:49      Kalman Drape , [facility] 04/19/2018, 10:28 AM  Pager: [phone] Mon-Wed, Friday 7:00am-4:30pm Thurs 7am-11:30am  Consults: [phone]

## 2018-04-20 ENCOUNTER — Ambulatory Visit: Payer: 59 | Admitting: Family Medicine

## 2018-04-20 ENCOUNTER — Encounter: Payer: Self-pay | Admitting: Family Medicine

## 2018-04-20 VITALS — BP 104/74 | HR 83 | Temp 98.5°F | Wt 168.8 lb

## 2018-04-20 DIAGNOSIS — H18231 Secondary corneal edema, right eye: Secondary | ICD-10-CM | POA: Diagnosis not present

## 2018-04-20 DIAGNOSIS — H34812 Central retinal vein occlusion, left eye, with macular edema: Secondary | ICD-10-CM | POA: Diagnosis not present

## 2018-04-20 DIAGNOSIS — K56609 Unspecified intestinal obstruction, unspecified as to partial versus complete obstruction: Secondary | ICD-10-CM | POA: Diagnosis not present

## 2018-04-20 DIAGNOSIS — H35352 Cystoid macular degeneration, left eye: Secondary | ICD-10-CM | POA: Diagnosis not present

## 2018-04-20 DIAGNOSIS — H35372 Puckering of macula, left eye: Secondary | ICD-10-CM | POA: Diagnosis not present

## 2018-04-20 DIAGNOSIS — I700 Atherosclerosis of aorta: Secondary | ICD-10-CM

## 2018-04-20 DIAGNOSIS — H34832 Tributary (branch) retinal vein occlusion, left eye, with macular edema: Secondary | ICD-10-CM | POA: Diagnosis not present

## 2018-04-20 NOTE — Progress Notes (Signed)
   Subjective:    Patient ID: [name], male    DOB: 05-14-1951, 67 y.o.   MRN: [id number]  HPI He is here for follow-up after recent hospitalization for evaluation of treatment that turned out to be a small bowel obstruction.  He did have a CT scan of his abdomen and pelvis which was essentially negative.  The reasoning behind the obstruction was really unclear.  He has had previous colonoscopy.  He is having some difficulty with stools but seems to be tolerating the MiraLAX.  Otherwise he has had no difficulty.  The CT scan did show evidence of atherosclerosis.   Review of Systems     Objective:   Physical Exam Alert and in no distress.  Cardiac exam shows regular rate without murmurs or gallops.  Lungs are clear to auscultation.  Abdominal exam shows no masses or tenderness.       Assessment & Plan:  SBO (small bowel obstruction) (HCC)  Abdominal aortic atherosclerosis ([place]) At this point he is cleared to resume normal activities.  Did recommend continued use of MiraLAX.

## 2018-04-21 ENCOUNTER — Other Ambulatory Visit: Payer: Self-pay | Admitting: *Deleted

## 2018-04-21 MED FILL — PANTOPRAZOLE SOD DR 40 MG T: 40 | 90 days supply | Qty: 180 | Fill #0

## 2018-04-21 MED FILL — TIMOLOL 0.5% EYE DROPS: 0.5 | 90 days supply | Qty: 5 | Fill #1

## 2018-04-21 MED FILL — VERAPAMIL ER 240 MG TABLET: 240 | 90 days supply | Qty: 90 | Fill #2

## 2018-04-21 NOTE — Patient Outreach (Signed)
[place] [facility]) Care Management  04/21/2018  BABAK LUCUS July 02, 1951 [id number]   Subjective: Telephone call to patient's home  number, no answer, left HIPAA compliant voicemail message, and requested call back.    Objective: Per KPN (Knowledge Performance Now, point of care tool) and chart review, patient hospitalized 04/18/18 -04/19/18 for small bowel obstruction.   Patient also has a history of hypertension, osteoarthritis, parkinsonian tremor, BPH, hyperlipidemia, and Abdominal aortic atherosclerosis.          Assessment: Received [facility] Transition of care referral on 04/20/18. Transition of care follow up pending patient contact.       Plan: RNCM will send unsuccessful outreach  letter, [facility] pamphlet, will call patient for 2nd telephone outreach attempt, transition of care follow up, and proceed with case closure, within 10 business days if no return call.       Righteous Claiborne H. Annia Friendly, BSN, [place] Management [facility] Telephonic CM Phone: [phone] Fax: [phone]'

## 2018-04-22 ENCOUNTER — Other Ambulatory Visit: Payer: Self-pay | Admitting: *Deleted

## 2018-04-22 NOTE — Patient Outreach (Signed)
[place] [facility]) Care Management  04/22/2018  JAKOLBY SEDIVY [birth year] [id number]   Subjective: Telephone call to patient's home  number, no answer, left HIPAA compliant voicemail message, and requested call back.    Objective: Per KPN (Knowledge Performance Now, point of care tool) and chart review, patient hospitalized 04/18/18 -04/19/18 for small bowel obstruction.   Patient also has a history of hypertension, osteoarthritis, parkinsonian tremor, BPH, hyperlipidemia, and Abdominal aortic atherosclerosis.          Assessment: Received UMR Transition of care referral on 04/20/18. Transition of care follow up pending patient contact.       Plan: RNCM has sent unsuccessful outreach  letter, [facility] pamphlet, will call patient for 3rd telephone outreach attempt, transition of care follow up, and proceed with case closure, within 10 business days if no return call.      Caden Fukushima H. Annia Friendly, BSN, [place] Management [facility] Telephonic CM Phone: [phone] Fax: [phone]

## 2018-04-25 ENCOUNTER — Ambulatory Visit
Admission: RE | Admit: 2018-04-25 | Discharge: 2018-04-25 | Disposition: A | Discharge status: Home / Self Care | Payer: 59 | Source: Ambulatory Visit | Attending: Physician Assistant | Admitting: Physician Assistant

## 2018-04-25 ENCOUNTER — Other Ambulatory Visit: Payer: Self-pay | Admitting: Physician Assistant

## 2018-04-25 ENCOUNTER — Other Ambulatory Visit: Payer: Self-pay | Admitting: *Deleted

## 2018-04-25 DIAGNOSIS — M48062 Spinal stenosis, lumbar region with neurogenic claudication: Secondary | ICD-10-CM

## 2018-04-25 DIAGNOSIS — M47817 Spondylosis without myelopathy or radiculopathy, lumbosacral region: Secondary | ICD-10-CM | POA: Diagnosis not present

## 2018-04-25 MED ORDER — METHYLPREDNISOLONE ACETATE 40 MG/ML INJ SUSP (RADIOLOG
120.0000 mg | Freq: Once | INTRAMUSCULAR | Status: AC
  Administered 2018-04-25: 120 mg via EPIDURAL

## 2018-04-25 MED ORDER — IOPAMIDOL (ISOVUE-M 200) INJECTION 41%
1.0000 mL | Freq: Once | INTRAMUSCULAR | Status: AC
  Administered 2018-04-25: 1 mL via EPIDURAL

## 2018-04-25 NOTE — Patient Outreach (Signed)
[place] [facility]) Care Management  04/25/2018  [name] [birth year] [id number]   Subjective: Received voicemail message from [name], states he is returning call, and requested call back. Telephone call to patient's  mobile number, no answer, left HIPAA compliant voicemail message, and requested call back.      Objective:Per KPN (Knowledge Performance Now, point of care tool) and chart review,patient hospitalized 04/18/18 -04/19/18 for small bowel obstruction. Patient also has a history of hypertension, osteoarthritis, parkinsonian tremor, BPH, hyperlipidemia, andAbdominal aortic atherosclerosis.      Assessment: Received [facility]MR Transition of care referral on 04/20/18.Transition of care follow up pending patient contact.      Plan:RNCM has sent unsuccessful outreach letter, [facility] pamphlet, and will proceed with case closure, within 10 business days if no return call.      Deena Shaub H. Annia Friendly, BSN, [place] Management [facility] Telephonic CM Phone: [phone] Fax: [phone]

## 2018-04-25 NOTE — Discharge Instructions (Signed)

## 2018-04-26 ENCOUNTER — Other Ambulatory Visit: Payer: 59

## 2018-05-04 ENCOUNTER — Other Ambulatory Visit: Payer: Self-pay | Admitting: Family Medicine

## 2018-05-04 ENCOUNTER — Telehealth: Payer: Self-pay | Admitting: Family Medicine

## 2018-05-04 DIAGNOSIS — Z1388 Encounter for screening for disorder due to exposure to contaminants: Secondary | ICD-10-CM

## 2018-05-04 NOTE — Telephone Encounter (Signed)
wife called & states she just found out that they have a lot of lead in her pipes and her and her husband would like to be tested for lead.  Can that be done here and can you put in orders for her and husband if this is ok?

## 2018-05-05 ENCOUNTER — Other Ambulatory Visit: Payer: Self-pay | Admitting: *Deleted

## 2018-05-05 NOTE — Telephone Encounter (Signed)
Lab appt made

## 2018-05-05 NOTE — Patient Outreach (Signed)
[facility]) Care Management  05/05/2018  [name] 07/31/1950 [id number]   No response from patient outreach attempts will proceed with case closure.     Objective:Per KPN (Knowledge Performance Now, point of care tool) and chart review,patient hospitalized 04/18/18 -04/19/18 for small bowel obstruction. Patient also has a history of hypertension, osteoarthritis, parkinsonian tremor, BPH, hyperlipidemia, andAbdominal aortic atherosclerosis.      Assessment: Received [facility] Transition of care referral on 04/20/18.Transition of care follow up not completed due to unable to contact patient and will proceed with case closure.      Plan: Case closure due to unable to reach.      [name] H. Annia Friendly, BSN, [place] Management [facility] Telephonic CM Phone: [phone] Fax: [phone]

## 2018-05-06 ENCOUNTER — Other Ambulatory Visit: Payer: 59

## 2018-05-06 DIAGNOSIS — Z1388 Encounter for screening for disorder due to exposure to contaminants: Secondary | ICD-10-CM

## 2018-05-09 LAB — LEAD, BLOOD (ADULT >= 16 YRS): Lead-Whole Blood: NOT DETECTED ug/dL (ref 0–4)

## 2018-05-13 MED FILL — ALLOPURINOL 100 MG TABLET: 100 | 30 days supply | Qty: 60 | Fill #1

## 2018-05-13 MED FILL — IRBESARTAN 150 MG TABLET: 150 | 30 days supply | Qty: 30 | Fill #4

## 2018-05-27 MED FILL — CARBIDOPA/LEVO 25/100 TAB: 25-100 | 90 days supply | Qty: 405 | Fill #1

## 2018-06-01 MED FILL — LIVALO 1 MG TABLET: 1 | 84 days supply | Qty: 24 | Fill #2

## 2018-06-06 DIAGNOSIS — H31092 Other chorioretinal scars, left eye: Secondary | ICD-10-CM | POA: Diagnosis not present

## 2018-06-06 DIAGNOSIS — H401190 Primary open-angle glaucoma, unspecified eye, stage unspecified: Secondary | ICD-10-CM | POA: Diagnosis not present

## 2018-06-06 DIAGNOSIS — H04123 Dry eye syndrome of bilateral lacrimal glands: Secondary | ICD-10-CM | POA: Diagnosis not present

## 2018-06-06 DIAGNOSIS — H35372 Puckering of macula, left eye: Secondary | ICD-10-CM | POA: Diagnosis not present

## 2018-06-06 DIAGNOSIS — Z961 Presence of intraocular lens: Secondary | ICD-10-CM | POA: Diagnosis not present

## 2018-06-14 MED FILL — IRBESARTAN 150 MG TABLET: 150 | 30 days supply | Qty: 30 | Fill #5

## 2018-06-14 MED FILL — ALLOPURINOL 100 MG TABS: 100 | 30 days supply | Qty: 60 | Fill #2

## 2018-06-29 ENCOUNTER — Other Ambulatory Visit: Payer: Self-pay | Admitting: Physician Assistant

## 2018-06-29 DIAGNOSIS — M48062 Spinal stenosis, lumbar region with neurogenic claudication: Secondary | ICD-10-CM

## 2018-07-06 MED FILL — TIMOLOL 0.5% EYE DROPS: 0.5 | 50 days supply | Qty: 5 | Fill #0

## 2018-07-06 MED FILL — ALFUZOSIN HCL ER 10 MG TAB: 10 | 90 days supply | Qty: 90 | Fill #1

## 2018-07-12 ENCOUNTER — Ambulatory Visit
Admission: RE | Admit: 2018-07-12 | Discharge: 2018-07-12 | Disposition: A | Discharge status: Home / Self Care | Payer: 59 | Source: Ambulatory Visit | Attending: Physician Assistant | Admitting: Physician Assistant

## 2018-07-12 ENCOUNTER — Other Ambulatory Visit: Payer: Self-pay | Admitting: Physician Assistant

## 2018-07-12 DIAGNOSIS — M48062 Spinal stenosis, lumbar region with neurogenic claudication: Secondary | ICD-10-CM

## 2018-07-12 DIAGNOSIS — M545 Low back pain: Secondary | ICD-10-CM | POA: Diagnosis not present

## 2018-07-12 MED ORDER — IOPAMIDOL (ISOVUE-M 200) INJECTION 41%
1.0000 mL | Freq: Once | INTRAMUSCULAR | Status: AC
  Administered 2018-07-12: 1 mL via EPIDURAL

## 2018-07-12 MED ORDER — METHYLPREDNISOLONE ACETATE 40 MG/ML INJ SUSP (RADIOLOG
120.0000 mg | Freq: Once | INTRAMUSCULAR | Status: AC
  Administered 2018-07-12: 120 mg via EPIDURAL

## 2018-07-12 NOTE — Discharge Instructions (Signed)

## 2018-07-18 ENCOUNTER — Other Ambulatory Visit (INDEPENDENT_AMBULATORY_CARE_PROVIDER_SITE_OTHER): Payer: Self-pay | Admitting: Family

## 2018-07-18 MED FILL — IRBESARTAN 150 MG TABLET: 150 | 30 days supply | Qty: 30 | Fill #6

## 2018-07-22 ENCOUNTER — Ambulatory Visit (INDEPENDENT_AMBULATORY_CARE_PROVIDER_SITE_OTHER): Payer: 59

## 2018-07-22 DIAGNOSIS — M205X1 Other deformities of toe(s) (acquired), right foot: Secondary | ICD-10-CM

## 2018-07-22 NOTE — Progress Notes (Signed)
The patient came in to get a new hammertoe crest for his right 2nd toe. He lost the one he had in his bed, but hasn't found it yet. He made an appointment for a followup on his plantar foot pain with Dr. Sharol Given while he was here.

## 2018-07-25 DIAGNOSIS — H34812 Central retinal vein occlusion, left eye, with macular edema: Secondary | ICD-10-CM | POA: Diagnosis not present

## 2018-07-25 DIAGNOSIS — H35352 Cystoid macular degeneration, left eye: Secondary | ICD-10-CM | POA: Diagnosis not present

## 2018-07-25 DIAGNOSIS — H18231 Secondary corneal edema, right eye: Secondary | ICD-10-CM | POA: Diagnosis not present

## 2018-07-25 DIAGNOSIS — H4720 Unspecified optic atrophy: Secondary | ICD-10-CM | POA: Diagnosis not present

## 2018-08-02 MED FILL — VERAPAMIL ER 240 MG TABLET: 240 | 90 days supply | Qty: 90 | Fill #3

## 2018-08-10 ENCOUNTER — Ambulatory Visit (INDEPENDENT_AMBULATORY_CARE_PROVIDER_SITE_OTHER): Payer: 59 | Admitting: Family

## 2018-08-10 ENCOUNTER — Ambulatory Visit (INDEPENDENT_AMBULATORY_CARE_PROVIDER_SITE_OTHER): Payer: 59 | Admitting: Orthopedic Surgery

## 2018-08-15 ENCOUNTER — Telehealth (INDEPENDENT_AMBULATORY_CARE_PROVIDER_SITE_OTHER): Payer: Self-pay | Admitting: Orthopedic Surgery

## 2018-08-15 NOTE — Telephone Encounter (Signed)
Patient called needing Rx refilled (Allopurinol) The number to contact patient is [phone]

## 2018-08-15 NOTE — Telephone Encounter (Signed)
I called and sw pt to advise that he has not been in the office since 02/2018 and last labs 11/2017 he has an appt for follow up 08/22/18 will check labs and provide refill at that time.

## 2018-08-17 ENCOUNTER — Other Ambulatory Visit: Payer: Self-pay | Admitting: Cardiology

## 2018-08-17 MED FILL — IRBESARTAN 150 MG TABLET: 150 | 30 days supply | Qty: 30 | Fill #0

## 2018-08-22 ENCOUNTER — Ambulatory Visit (INDEPENDENT_AMBULATORY_CARE_PROVIDER_SITE_OTHER): Payer: 59

## 2018-08-22 ENCOUNTER — Ambulatory Visit (INDEPENDENT_AMBULATORY_CARE_PROVIDER_SITE_OTHER): Payer: 59 | Admitting: Orthopedic Surgery

## 2018-08-22 ENCOUNTER — Encounter (INDEPENDENT_AMBULATORY_CARE_PROVIDER_SITE_OTHER): Payer: Self-pay | Admitting: Orthopedic Surgery

## 2018-08-22 DIAGNOSIS — M79672 Pain in left foot: Secondary | ICD-10-CM

## 2018-08-22 DIAGNOSIS — M79671 Pain in right foot: Secondary | ICD-10-CM

## 2018-08-22 NOTE — Progress Notes (Signed)
Office Visit Note   Patient: [name]           Date of Birth: 1951/07/19           MRN: [id number] Visit Date: 08/22/2018              Requested by: Denita Lung, MD [address] [place], [place] [address] PCP: Denita Lung, MD  Chief Complaint  Patient presents with  . Right Foot - Pain  . Left Foot - Pain      HPI: Patient is a 68 year old gentleman who presents complaining of bilateral forefoot pain.  Patient states he has a history of gout and history of spinal stenosis.  He states he has burning pain over the lateral aspect the left foot and complains of pain of the MTP joint of both feet right worse than left.  Assessment & Plan: Visit Diagnoses:  1. Pain in left foot   2. Pain in right foot     Plan: Patient will work on Achilles stretching he will get a pair of stiff soled walking sneakers.  We will draw a uric acid level to rule out gout.  Follow-Up Instructions: Return in about 4 weeks (around 09/19/2018).   Ortho Exam  Patient is alert, oriented, no adenopathy, well-dressed, normal affect, normal respiratory effort. Examination patient has a good dorsalis pedis pulse bilaterally.  Patient has a negative straight leg raise bilaterally no focal motor weakness in either lower extremity he does have radicular pain down the lateral aspect of the left foot with no ulcers no cellulitis.  Examination of both feet he has dorsiflexion only about 20 degrees with hallux rigidus bilaterally status post chevron and Weil osteotomies for the right foot with increased valgus deformity of the right great toe causing the lateral drift of the second toe as well.  Dorsiflexion of the great toe is painful.  Imaging: Xr Foot Complete Left  Result Date: 08/22/2018 2 view radiographs of the left foot shows joint space narrowing dorsally at the MTP joint of the left great toe with a long second metatarsal.  Xr Foot Complete Right  Result Date: 08/22/2018 2 view  radiographs of the right foot shows a previous Weil osteotomy for the second metatarsal and previous bunion surgery for the first metatarsal.  There is some joint space narrowing dorsally there is some periarticular cysts at the MTP joint of the great toe and second toe.  No images are attached to the encounter.  Labs: Lab Results  Component Value Date   LABURIC 4.6 11/29/2017     Lab Results  Component Value Date   ALBUMIN 3.7 04/19/2018   ALBUMIN 4.5 04/18/2018   ALBUMIN 4.5 09/29/2017   LABURIC 4.6 11/29/2017    There is no height or weight on file to calculate BMI.  Orders:  Orders Placed This Encounter  Procedures  . XR Foot Complete Left  . XR Foot Complete Right  . Uric acid   No orders of the defined types were placed in this encounter.    Procedures: No procedures performed  Clinical Data: No additional findings.  ROS:  All other systems negative, except as noted in the HPI. Review of Systems  Objective: Vital Signs: There were no vitals taken for this visit.  Specialty Comments:  No specialty comments available.  PMFS History: Patient Active Problem List   Diagnosis Date Noted  . SBO (small bowel obstruction) ([place]) 04/18/2018  . DOE (dyspnea on exertion) 09/27/2017  .  Bunion of great toe of right foot 07/14/2017  . Claw toe, acquired, right 07/14/2017  . 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  . Medication management 10/09/2016  . Plantar fasciitis of right foot 02/19/2015  . Depression 08/14/2014  . Family history of heart disease in male family member before age 52 04/26/2014  . Mild cognitive impairment 03/13/2014  . Parkinsonian tremor ([place]) 02/12/2014  . Hyperlipidemia with target LDL less than 100   . Abdominal aortic atherosclerosis ([place])   . Moderate essential hypertension 02/25/2011  . Arthropathy 02/25/2011  . HAV (hallux abducto valgus) 02/25/2011   Past Medical History:  Diagnosis Date  .  Allergy   . Arthritis   . BPH (benign prostatic hyperplasia)   . Complication of anesthesia    Pt. stated he had a reaction that ended in him requiring urinary cath placement  . Diverticulosis   . GERD (gastroesophageal reflux disease)    esophageal spasms  . Glaucoma   . Gout   . Head injury, closed, with concussion   . Hepatitis C    chronic - Has been treated with Harvoni  . HLD (hyperlipidemia)    statin intolerant (Crestor & Simvastatin) - Taking Livalo 1mg  / week  . Hypertension   . Parkinson's disease ([place])   . Plantar fasciitis    right  . PVD (peripheral vascular disease) ([place])    With no claudication; only mild abdominal aortic atherosclerosis noted on ultrasound.  . Thoracic ascending aortic aneurysm ([facility])    4.2 cm ascending TAA 09/2016 CT, 1 yr f/u rec  . Ulcer     Family History  Problem Relation Age of Onset  . Heart disease Mother   . Cancer Paternal Grandfather   . Cancer Sister     Past Surgical History:  Procedure Laterality Date  . CARDIAC CATHETERIZATION  2005   30% Cx. Dr. Melvern Banker  . cataract surgery Left 11/05/2014  . COLONOSCOPY    . ESOPHAGOGASTRODUODENOSCOPY N/A 05/02/2015   Procedure: ESOPHAGOGASTRODUODENOSCOPY (EGD);  Surgeon: Ronald Lobo, MD;  Location: [facility];  Service: Endoscopy;  Laterality: N/A;  . ESOPHAGOGASTRODUODENOSCOPY  05/02/2015   no source of pt chest pain endoscopically evident. small hiatal hernia.  [name] EYE SURGERY    . MEMBRANE PEEL Left 03/14/2014   Procedure: MEMBRANE PEEL; ENDOLASER;  Surgeon: Hurman Horn, MD;  Location: [facility];  Service: Ophthalmology;  Laterality: Left;  . NM MYOVIEW LTD  2011   Neg Ischemia or infarct.  [name] NM MYOVIEW LTD  10/2015   LOW RISK. Small, fixed basal lateral defect - likely diaphragmatic attenuation. EF 69%  . PARS PLANA VITRECTOMY Left 03/14/2014   Procedure: PARS PLANA VITRECTOMY WITH 25 GAUGE;  Surgeon: Hurman Horn, MD;  Location: [facility];  Service: Ophthalmology;  Laterality: Left;  .  TONSILLECTOMY    . TRANSTHORACIC ECHOCARDIOGRAM  2013   Normal EF. No significant Valve Disease  . UPPER GASTROINTESTINAL ENDOSCOPY    . WEIL OSTEOTOMY Right 09/01/2017   Procedure: RIGHT GREAT TOE CHEVRON AND WEIL OSTEOTOMY 2ND METATARSAL;  Surgeon: Newt Minion, MD;  Location: [facility];  Service: Orthopedics;  Laterality: Right;   Social History   Occupational History  . 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 Smoking status: Former Smoker    Types: Cigars    Last attempt to quit: 07/27/1988    Years since quitting: 30.0  . Smokeless tobacco: Never Used  Substance and Sexual  Activity  . Alcohol use: Yes    Alcohol/week: 3.0 standard drinks    Types: 1 Glasses of wine, 1 Cans of beer, 1 Shots of liquor per week    Comment: occasional  . Drug use: No  . Sexual activity: Yes

## 2018-08-23 LAB — URIC ACID: Uric Acid, Serum: 6.1 mg/dL (ref 4.0–8.0)

## 2018-08-23 NOTE — Progress Notes (Signed)
[facility] PATIENT: RODY KEADLE DOB: Mar 16, 1951   REASON FOR VISIT: Follow-up for tremor mild cognitive impairment, depression HISTORY FROM: Patient  HISTORY OF PRESENT ILLNESS:HISTORY:63 year African American male who since last year and a half has noticed increasing tremors mainly in the left arm and leg and to a lesser degree in the right arm as well. He states the tremors were quite mild but became more pronounced after a minor accident while staying [place] the [place] in [place]. He fell down 12 flights of steps and hit a concrete slab and had a concussion. He and some minor bruises and knee injury which took several months to reck of her period CT scan of the head was done 2 days later which was unremarkable. Since then he has had some walking difficulty but this may be related to his knee pain. He says that his feet do at times gets stuck in his started walking with a stooped posture. He however does not describe typical festination. He has resting and intermittent action tremor claiming the left arm and leg the tremor does improve with activities. The tremor does not seem to interfere with most of his routine. He denies significant bradykinesia, and drooling of saliva or micrographia. He has been evaluated by neurologists Dr. Reginia Forts at [facility] neurology but he is unable to tell me for the diagnosis was. He was not told that this may be Parkinson's and was not tried on dopaminergic medications. He did have an MRI scan and some lab work but I do not have those results for my review available today. Patient is here today for a second opinion as he feels his tremor is not getting better. He does admit to some light masonry hallucinations as well as restless sleep thrashing of his legs. He denies significant memory or cognitive difficulties. He has not noticed any particular effect of alcohol on his tremor. Is no family history of tremors. Patient does have  chronic hepatitis C and is planning to start treatment with dapsone. He has had no seizures, significant head injury with major loss of consciousness, stroke, TIAs or significant neurological problems.  Update 08/14/2014 : He returns for follow-up after last visit 3 months ago. He feels his tremors are about the same. He had tried reducing the dose of Sinemet but noticed that his tremors got worse and hence he went back up to the original dose which is 25/100 one tablet 3 times daily. He still feels occasionally confused and at times secondary to some cells but he admits that he worries a lot. He also admits to feeling depressed, getting tired easily and not having initiated. He has not been on any medications for depression. Patient denies significant drooling of saliva, bradykinesia, gait or balance problems. He feels his tremor is mild and does not interfere with his activities of daily living. I discussed alternative treatment options including addition of dopamine agonist, anticholinergic or even consideration for deep brain stimulation since his tremor is predominantly unilateral however the patient does not want to consider more aggressive treatment options at the present time. UPDATE 12/26/14 [name], 68 year old black male returns for follow-up. He was last seen by Dr. [name] 08/14/2014. He feels his tremors are better since increasing his carbidopa levodopa to 1-1/2 tablets 3 times daily. He is tolerating it well without dizziness, sleepiness, nightmares or hallucinations A MRI Brain with and without Contrast completed on 07/29/2011 was normal. The Mini-Mental status exam today is unchanged 29 out  of 30. He did not follow-up with his primary care for  complaints of depression. He denies any significant drooling bradykinesia falls or balance problems. His tremor does not interfere with his activities of daily living. He returns for reevaluation Update 07/03/2015 : He returns for follow-up after last  visit 6 months ago. He states his tremors continue to do well and he seems to have responded quite well to increased dose of Sinemet 25/100 1-1/2 tablet 3 times daily. Is tolerating it well without any dizziness, sleepiness, nightmares, hallucinations, nausea or diarrhea. He continues to have mild memory difficulties but his has not been on any treatment for depression and in fact has not seen his primary care physician for the same for quite some time. Update 08/11/2016 PS : He returns for follow-up after last visit year ago. He states his tremors as well as memory loss or more or less unchanged. Continues to have intermittent tremors in the left hand mainly. The tremors fluctuate he has good days and bad days. He is currently taking Sinemet 25/100 one and half tablet 3 times daily. He complains of mild fatigue and daytime sleepiness. He denies hallucinations, delusions, dizziness or upset stomach. He does not want to try higher dose because of fear of side effects. Continues to have short-term memory difficulties. He has not been quite compulsive about taking notes and using memory compensation strategies but plans to do so. He has no new complaints today. UPDATE 07/19/2018CM Mr. Langhorst, 68 year old male returns for follow-up with tremors and mild cognitive impairment which he says is stable. He continues to have a resting tremor in the left hand which is intermittent. He continues to have good and bad days. He remains on Sinemet 25 100 (1 and half tablets) 3 times daily. He does little exercise he is not doing any memory compensation strategies at this time and was encouraged to do so. He denies any falls. He denies any hallucinations or increased confusion. He denies any freezing spells. He does complain of left hip pain He returns for reevaluation UPDATE 1/22/2019CM Mr. Elwood, 39 he denies 40-year-old male returns for follow-up with a history of Parkinson's disease.  He continues to have mild resting tremor in  the left hand which is intermittent.  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  He remains on Sinemet 3 times a day however he is taking his medication with a meal.  He complains of sometimes being lightheaded when he stands up too quickly.  He was encouraged to sit on the side of the bed and then get up slightly.  He was also encouraged to stay well-hydrated.  He has not been exercising.  He denies any hallucinations or increased confusion.  He has not had any freezing spells.  He returns for reevaluation 02/15/18 UPDATE:JV  Patient returns today for six-month follow-up.  He continues to take Sinemet 3 times daily with the first dose around 6 AM (1 hour prior to eating breakfast), second dose 11 AM (1 hour prior to eating lunch) and then third dose between 5:30 and 6 PM (eats dinner around 6:30-7 PM).  Patient denies any increase in symptoms between doses but does state if he is late on a dose, he will start having increased tremors in his left upper extremity and left lower extremity.  He continues to complain of short-term memory issue and is unable to state whether it is worsening but he states he notices it more.  He did obtain a large book full  avoid games but does this approximately 1 time per week.  Recommended to do these activities at least once daily.  Patient also has complaints of feeling as though he overanalyzes certain situations and feeling increased fatigue.  Patient does endorse increased stress recently but does feel somewhat situational.  Provided patient with stress relaxation techniques and advised him if this continues, to follow-up with PCP for possible medication management.  He denies any hallucinations or increased confusion.  He denies any freezing spells. UPDATE 1/29/2020CM Mr. Langille, 68 year old male returns for follow-up with a history of Parkinson's disease and mild cognitive impairment.  He denies any freezing spells any stiffness any falls.  He denies any difficulty  swallowing.  He has spinal stenosis and had recent injection.  He is seeing Dr. Sharol Given for a foot problem.  He is currently on Sinemet 3 times a day before meals.  He has not been exercising and was encouraged to do so.  He has not had any hallucinations or increased confusion.  Noted very intermittent left tremor of the hand.  Patient reports some anxiety in public.  He was encouraged to perform stress relaxation techniques.  He returns for reevaluation REVIEW OF SYSTEMS: Full 14 system review of systems performed and notable only for those listed, all others are neg:  Constitutional: neg  Cardiovascular: neg Ear/Nose/Throat: neg  Skin: neg Eyes: Eye pain due to glaucoma Respiratory: neg Gastroitestinal: Urinary frequency Hematology/Lymphatic: neg  Endocrine: neg Musculoskeletal:  back pain Allergy/Immunology: neg Neurological: Mild cognitive impairment, intermittent tremor Psychiatric: Anxiety at times Sleep : neg   ALLERGIES: No Known Allergies  HOME MEDICATIONS: Outpatient Medications Prior to Visit  Medication Sig Dispense Refill  . Acetaminophen (ACETAMIN PO) Take 500 mg by mouth every 4 (four) hours as needed (pain).     [name] alfuzosin (UROXATRAL) 10 MG 24 hr tablet Take 10 mg by mouth at bedtime.     . carbidopa-levodopa (SINEMET IR) 25-100 MG tablet TAKE 1 AND 1/2 TABLET BY MOUTH 3 TIMES DAILY. (Patient taking differently: Take 1.5 tablets by mouth 3 (three) times daily. ) 405 tablet 1  . diclofenac sodium (VOLTAREN) 1 % GEL Apply 2 g topically 4 (four) times daily. (Patient taking differently: Apply 2 g topically 4 (four) times daily as needed (pain). ) 100 g 0  . irbesartan (AVAPRO) 150 MG tablet TAKE 1 TABLET BY MOUTH EVERY EVENING. 30 tablet 2  . LIVALO 1 MG TABS TAKE 1 TABLET BY MOUTH 2 TIMES A WEEK (Patient taking differently: Take 1 mg by mouth 2 (two) times a week. Takes on Monday and Friday) 24 tablet 2  . Multiple Vitamin (MULTIVITAMIN WITH MINERALS) TABS tablet Take 1  tablet by mouth 4 (four) times a week.     . nitroGLYCERIN (NITROSTAT) 0.4 MG SL tablet PLACE 1 TABLET UNDER THE TONGUE EVERY 5 MINUTES AS NEEDED FOR CHEST PAIN. (Patient taking differently: Place 0.4 mg under the tongue every 5 (five) minutes as needed for chest pain. ) 25 tablet 6  . pantoprazole (PROTONIX) 40 MG tablet Take 40 mg by mouth 2 (two) times daily.   0  . timolol (TIMOPTIC) 0.5 % ophthalmic solution Place 1 drop into the left eye daily.     . TURMERIC PO Take 1,000 mg by mouth daily.    . verapamil (CALAN-SR) 240 MG CR tablet TAKE 1 TABLET (240 MG TOTAL) BY MOUTH DAILY. 90 tablet 3  . vitamin B-12 (CYANOCOBALAMIN) 1000 MCG tablet Take 1,000 mcg by mouth 3 (three)  times a week.     [name] allopurinol (ZYLOPRIM) 100 MG tablet TAKE 1 TABLET BY MOUTH TWICE A DAY (Patient not taking: No sig reported) 60 tablet 2  . ibuprofen (ADVIL,MOTRIN) 200 MG tablet Take 400 mg by mouth 2 (two) times daily as needed for headache or moderate pain.    . traMADol (ULTRAM) 50 MG tablet Take 50 mg by mouth every 6 (six) hours as needed.     No facility-administered medications prior to visit.     PAST MEDICAL HISTORY: Past Medical History:  Diagnosis Date  . Allergy   . Arthritis   . BPH (benign prostatic hyperplasia)   . Complication of anesthesia    Pt. stated he had a reaction that ended in him requiring urinary cath placement  . Diverticulosis   . GERD (gastroesophageal reflux disease)    esophageal spasms  . Glaucoma   . Gout   . Head injury, closed, with concussion   . Hepatitis C    chronic - Has been treated with Harvoni  . HLD (hyperlipidemia)    statin intolerant (Crestor & Simvastatin) - Taking Livalo 1mg  / week  . Hypertension   . Parkinson's disease ([place])   . Plantar fasciitis    right  . PVD (peripheral vascular disease) ([place])    With no claudication; only mild abdominal aortic atherosclerosis noted on ultrasound.  . Thoracic ascending aortic aneurysm ([facility])    4.2 cm ascending  TAA 09/2016 CT, 1 yr f/u rec  . Ulcer     PAST SURGICAL HISTORY: Past Surgical History:  Procedure Laterality Date  . CARDIAC CATHETERIZATION  2005   30% Cx. Dr. Melvern Banker  . cataract surgery Left 11/05/2014  . COLONOSCOPY    . ESOPHAGOGASTRODUODENOSCOPY N/A 05/02/2015   Procedure: ESOPHAGOGASTRODUODENOSCOPY (EGD);  Surgeon: Ronald Lobo, MD;  Location: [facility];  Service: Endoscopy;  Laterality: N/A;  . ESOPHAGOGASTRODUODENOSCOPY  05/02/2015   no source of pt chest pain endoscopically evident. small hiatal hernia.  [name] EYE SURGERY    . MEMBRANE PEEL Left 03/14/2014   Procedure: MEMBRANE PEEL; ENDOLASER;  Surgeon: Hurman Horn, MD;  Location: [place];  Service: Ophthalmology;  Laterality: Left;  . NM MYOVIEW LTD  2011   Neg Ischemia or infarct.  [name] NM MYOVIEW LTD  10/2015   LOW RISK. Small, fixed basal lateral defect - likely diaphragmatic attenuation. EF 69%  . PARS PLANA VITRECTOMY Left 03/14/2014   Procedure: PARS PLANA VITRECTOMY WITH 25 GAUGE;  Surgeon: Hurman Horn, MD;  Location: [place];  Service: Ophthalmology;  Laterality: Left;  . TONSILLECTOMY    . TRANSTHORACIC ECHOCARDIOGRAM  2013   Normal EF. No significant Valve Disease  . UPPER GASTROINTESTINAL ENDOSCOPY    . WEIL OSTEOTOMY Right 09/01/2017   Procedure: RIGHT GREAT TOE CHEVRON AND WEIL OSTEOTOMY 2ND METATARSAL;  Surgeon: Newt Minion, MD;  Location: [place];  Service: Orthopedics;  Laterality: Right;    FAMILY HISTORY: Family History  Problem Relation Age of Onset  . Heart disease Mother   . Cancer Paternal Grandfather   . Cancer Sister     SOCIAL HISTORY: Social History   Socioeconomic History  . Marital status: Married    Spouse name: barbra gen  . Number of children: 3  . Years of education: AS  . Highest education level: Not on file  Occupational History  . Occupation: self employed    Fish farm manager: [employer]  Social Needs  . Financial resource strain: Not on file  .  Food insecurity:    Worry: Not on  file    Inability: Not on file  . Transportation needs:    Medical: Not on file    Non-medical: Not on file  Tobacco Use  . Smoking status: Former Smoker    Types: Cigars    Last attempt to quit: 07/27/1988    Years since quitting: 30.0  . Smokeless tobacco: Never Used  Substance and Sexual Activity  . Alcohol use: Yes    Alcohol/week: 3.0 standard drinks    Types: 1 Glasses of wine, 1 Cans of beer, 1 Shots of liquor per week    Comment: occasional  . Drug use: No  . Sexual activity: Yes  Lifestyle  . Physical activity:    Days per week: Not on file    Minutes per session: Not on file  . Stress: Not on file  Relationships  . Social connections:    Talks on phone: Not on file    Gets together: Not on file    Attends religious service: Not on file    Active member of club or organization: Not on file    Attends meetings of clubs or organizations: Not on file    Relationship status: Not on file  . Intimate partner violence:    Fear of current or ex partner: Not on file    Emotionally abused: Not on file    Physically abused: Not on file    Forced sexual activity: Not on file  Other Topics Concern  . 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     Wife is retired Therapist, sports from [employer].   He walks roughly 2-3 miles in bed time at least 2-3 times a week. He quit smoking 30 years ago.     PHYSICAL EXAM  Vitals:   08/24/18 0920  BP: 129/83  Pulse: 78  Weight: 175 lb 12.8 oz (79.7 kg)  Height: 5\' 10"  (1.778 m)   Body mass index is 25.22 kg/m.  Generalized: Well developed, in no acute distress Negative myersons  sign Head: normocephalic and atraumatic,. Oropharynx benign  Neck: Supple,  Musculoskeletal: No deformity   Neurological examination   Mentation: Alert. Clock drawing 4/4 MMSE - Mini Mental State Exam 08/24/2018 05/11/2014  Orientation to time 5 5  Orientation to Place 5 5  Registration 3  3  Attention/ Calculation 5 5  Recall 2 2  Language- name 2 objects 2 2  Language- repeat 1 1  Language- follow 3 step command 3 3  Language- read & follow direction 1 1  Write a sentence 1 1  Write a sentence-comments no subject -  Copy design 1 1  Total score 29 29  Follows all commands speech and language fluent.   Cranial nerve II-XII: Pupils were equal round reactive to light extraocular movements were full, visual field were full on confrontational test. Facial sensation and strength were normal. hearing was intact to finger rubbing bilaterally. Uvula tongue midline. head turning and shoulder shrug were normal and symmetric.Tongue protrusion into cheek strength was normal. Motor: normal bulk and tone, full strength in the BUE, BLE, fine finger movements normal, no bradykinesia, intermittent left hand resting tremor,  Sensory: normal and symmetric to light touch, on the face arms and legs  Coordination: finger-nose-finger, heel-to-shin bilaterally, no dysmetria Reflexes: 1+ upper lower and symmetric plantar responses were flexor bilaterally. Gait and Station: Rising up from seated position without  assistance, ambulated 210 feet in the hall  moderate stride, decreased  arm swing bilaterally, smooth turning, no assistive device DIAGNOSTIC DATA (LABS, IMAGING, TESTING) - I reviewed patient records, labs, notes, testing and imaging myself where available.  Lab Results  Component Value Date   WBC 9.1 04/19/2018   HGB 14.0 04/19/2018   HCT 41.2 04/19/2018   MCV 92.6 04/19/2018   PLT 258 04/19/2018      Component Value Date/Time   NA 140 04/19/2018 0411   NA 137 09/29/2017 0927   K 4.3 04/19/2018 0411   CL 106 04/19/2018 0411   CO2 28 04/19/2018 0411   GLUCOSE 113 (H) 04/19/2018 0411   BUN 19 04/19/2018 0411   BUN 15 09/29/2017 0927   CREATININE 1.05 04/19/2018 0411   CREATININE 1.06 06/29/2014 0920   CALCIUM 9.3 04/19/2018 0411   PROT 6.8 04/19/2018 0411   PROT 7.6  09/29/2017 0927   ALBUMIN 3.7 04/19/2018 0411   ALBUMIN 4.5 09/29/2017 0927   AST 13 (L) 04/19/2018 0411   ALT 6 04/19/2018 0411   ALKPHOS 47 04/19/2018 0411   BILITOT 0.6 04/19/2018 0411   BILITOT 0.5 09/29/2017 0927   GFRNONAA >60 04/19/2018 0411   GFRAA >60 04/19/2018 0411   Lab Results  Component Value Date   CHOL 144 09/29/2017   HDL 46 09/29/2017   LDLCALC 89 09/29/2017   TRIG 47 09/29/2017   CHOLHDL 3.1 09/29/2017     Lab Results  Component Value Date   TSH 1.950 09/28/2016    ASSESSMENT AND PLAN 68 y.o. year old male  has a medical history of 3.5 year history of resting left arm and leg tremors with mild features of early left sided Parkinson's disease. Good response to Sinemet. He also complains of mild cognitive impairment which appears stable.    PLAN: Continue carbidopa/levodopa three times daily will refill Get into a routine exercise program Do mentally challenging activities like crossword puzzles and playing bridge or solitare MMSE is stable 29/30  Follow-up in 6 months  I spent 25 minutes in total face to face time with the patient more than 50% of which was spent counseling and coordination of care, reviewing test results reviewing medications and discussing and reviewing the diagnosis of Parkinson's disease and mild cognitive impairment and importance of exercise and stress relaxation techniques Dennie Bible, [facility], [facility], APRN   [facility] [address] [place], [place] [address] [phone]

## 2018-08-24 ENCOUNTER — Encounter: Payer: Self-pay | Admitting: Nurse Practitioner

## 2018-08-24 ENCOUNTER — Ambulatory Visit: Payer: 59 | Admitting: Nurse Practitioner

## 2018-08-24 VITALS — BP 129/83 | HR 78 | Ht 70.0 in | Wt 175.8 lb

## 2018-08-24 DIAGNOSIS — G3184 Mild cognitive impairment, so stated: Secondary | ICD-10-CM | POA: Diagnosis not present

## 2018-08-24 DIAGNOSIS — G20 Parkinson's disease: Secondary | ICD-10-CM

## 2018-08-24 DIAGNOSIS — G20C Parkinsonism, unspecified: Secondary | ICD-10-CM

## 2018-08-24 MED ORDER — CARBIDOPA-LEVODOPA 25-100 MG PO TABS: 1.5000 | ORAL_TABLET | Freq: Three times a day (TID) | ORAL | 1 refills | Status: DC

## 2018-08-24 MED FILL — CARBIDOPA/LEVO 25/100 TAB: 25-100 | 90 days supply | Qty: 405 | Fill #0

## 2018-08-24 NOTE — Patient Instructions (Signed)
Continue carbidopa/levodopa three times daily Get into a routine exercise program Do mentally challenging activities like crossword puzzles and playing bridge or solitare MMSE is stable Follow-up in 6 months

## 2018-08-25 ENCOUNTER — Telehealth (INDEPENDENT_AMBULATORY_CARE_PROVIDER_SITE_OTHER): Payer: Self-pay

## 2018-08-25 ENCOUNTER — Telehealth (INDEPENDENT_AMBULATORY_CARE_PROVIDER_SITE_OTHER): Payer: Self-pay | Admitting: Orthopedic Surgery

## 2018-08-25 NOTE — Progress Notes (Signed)
I agree with the above plan 

## 2018-08-25 NOTE — Telephone Encounter (Signed)
Pt called asking about results for blood work, and how his inserts in his are uncomfortable. PT stated he wasn't experiencing this much pain before so he decided not to wear the inserts.

## 2018-08-25 NOTE — Telephone Encounter (Signed)
I called and informed patient that labs were good and I would inform Dr Sharol Given that he is not wearing inserts.

## 2018-08-29 ENCOUNTER — Other Ambulatory Visit: Payer: Self-pay | Admitting: Cardiology

## 2018-08-29 DIAGNOSIS — M48062 Spinal stenosis, lumbar region with neurogenic claudication: Secondary | ICD-10-CM | POA: Diagnosis not present

## 2018-08-29 DIAGNOSIS — I10 Essential (primary) hypertension: Secondary | ICD-10-CM | POA: Diagnosis not present

## 2018-08-29 DIAGNOSIS — Z6825 Body mass index (BMI) 25.0-25.9, adult: Secondary | ICD-10-CM | POA: Diagnosis not present

## 2018-08-29 MED FILL — LIVALO 1 MG TABLET: 1 | 84 days supply | Qty: 24 | Fill #0

## 2018-08-29 NOTE — Telephone Encounter (Signed)
Rx request sent to pharmacy.  

## 2018-09-13 DIAGNOSIS — K219 Gastro-esophageal reflux disease without esophagitis: Secondary | ICD-10-CM | POA: Diagnosis not present

## 2018-09-13 DIAGNOSIS — R079 Chest pain, unspecified: Secondary | ICD-10-CM | POA: Diagnosis not present

## 2018-09-15 MED FILL — IRBESARTAN 150 MG TABLET: 150 | 30 days supply | Qty: 30 | Fill #1

## 2018-09-17 IMAGING — CT CT ORBITS W/O CM
3 of 4 series · 14 of 47 positions shown, 17 images · non-contrast
Comparison: 06/09/2012

CLINICAL DATA: Exophthalmos on the right. History of glaucoma and
retinal tear.

EXAM:
CT ORBITS WITHOUT CONTRAST
TECHNIQUE: Multidetector CT images were obtained using the standard protocol
without intravenous contrast.

[Series 3: orbits 2.00 hr36 s3 orbits soft · axial · 0.42mm/px · z∈[-642,-528]mm · 9 of 67 slices shown, 12 images]
[im 5/67  brain]
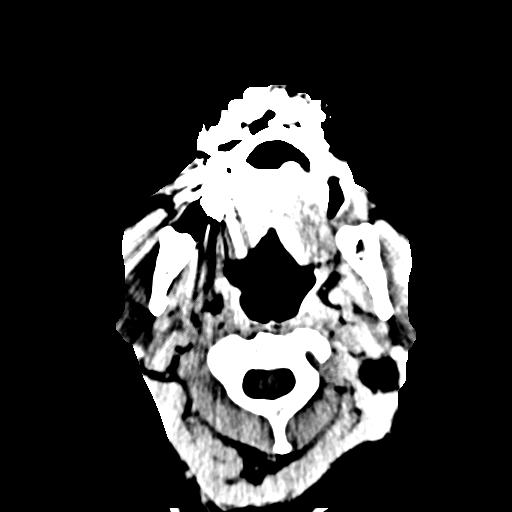
[im 5/67  bone]
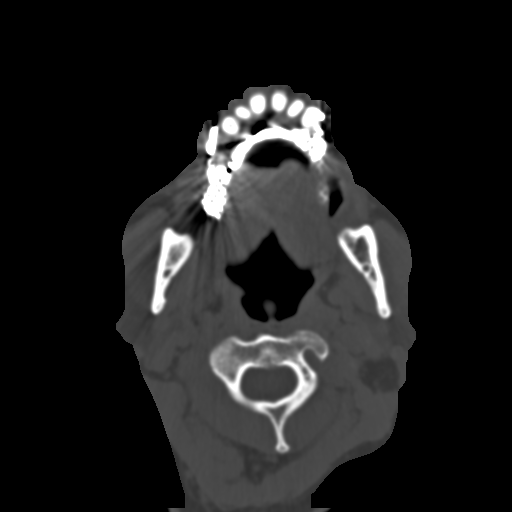
[im 12/67  bone]
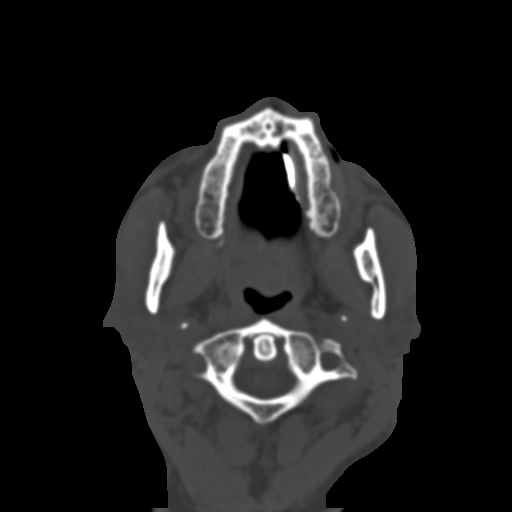
[im 19/67  bone]
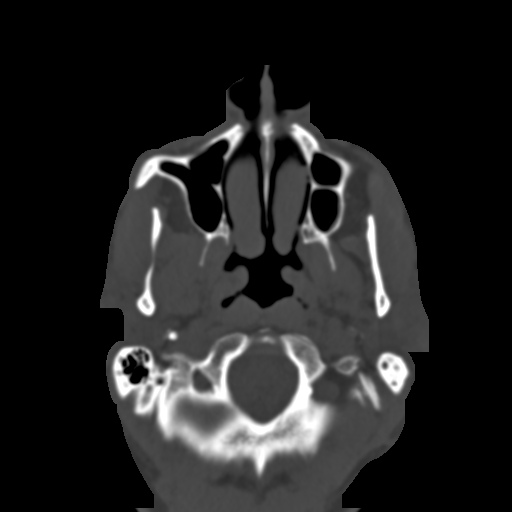
[im 26/67  bone]
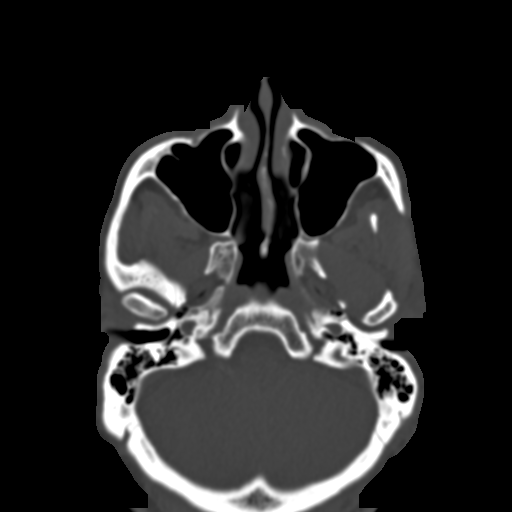
[im 35/67  brain]
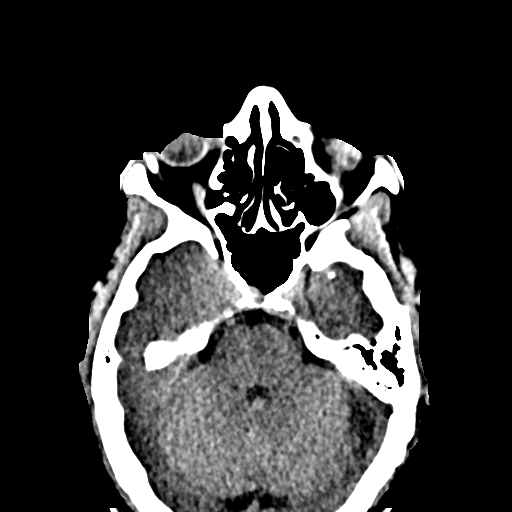
[im 35/67  bone]
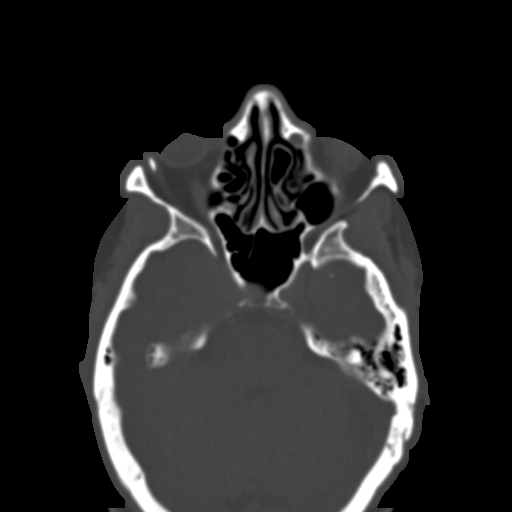
[im 41/67  bone]
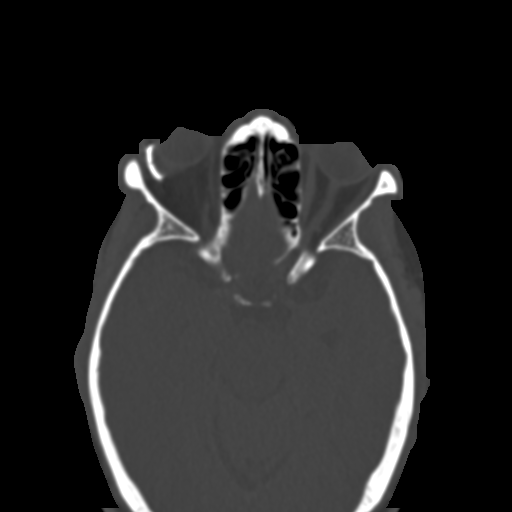
[im 48/67  bone]
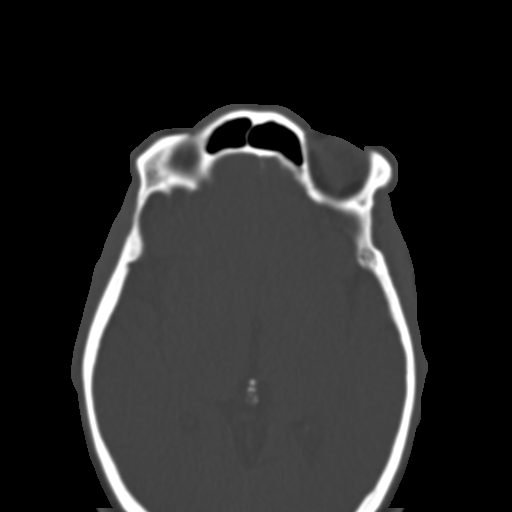
[im 55/67  bone]
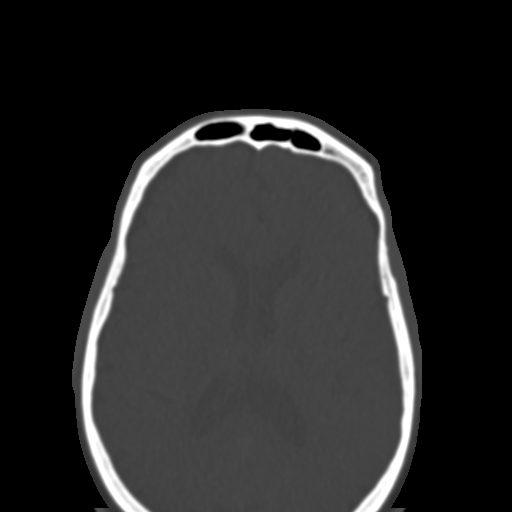
[im 62/67  brain]
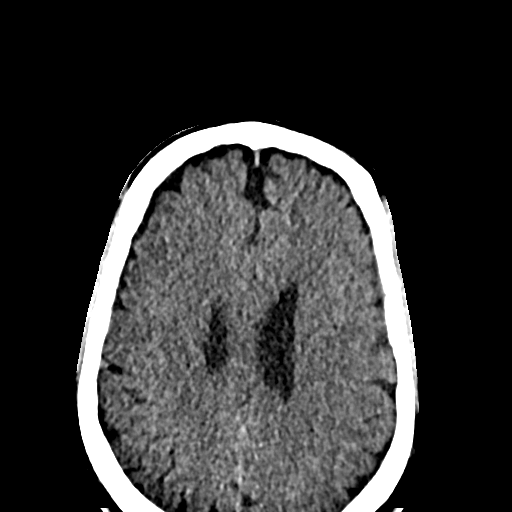
[im 62/67  bone]
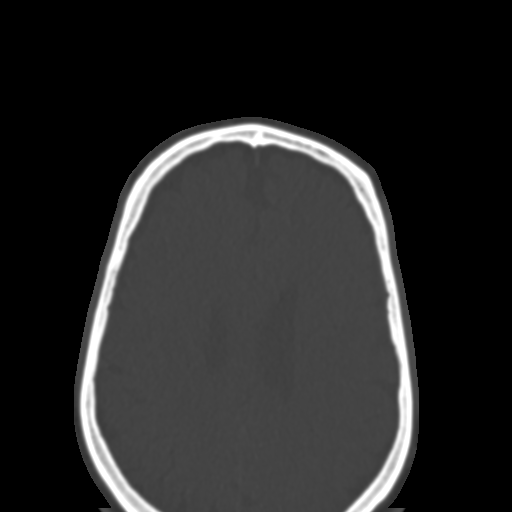

[Series 4: orbits 2.00 br36 s3 cor cor soft · coronal · 0.26mm/px · 3 of 109 slices shown]
[im 37/109  bone]
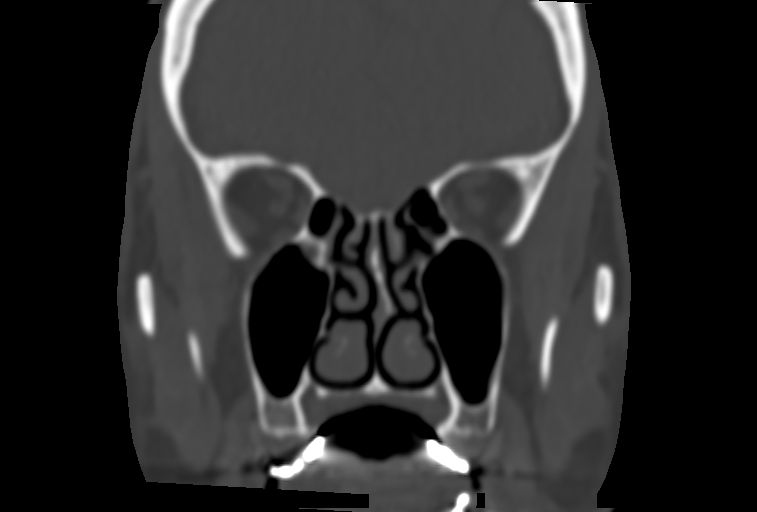
[im 49/109  bone]
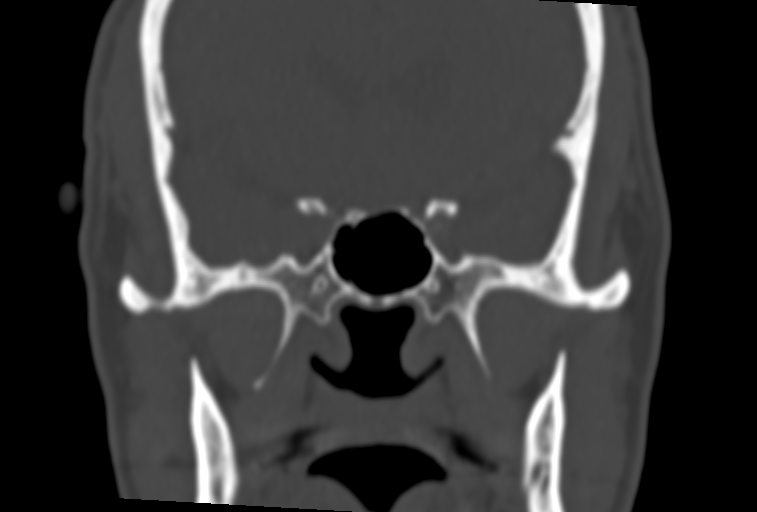
[im 61/109  bone]
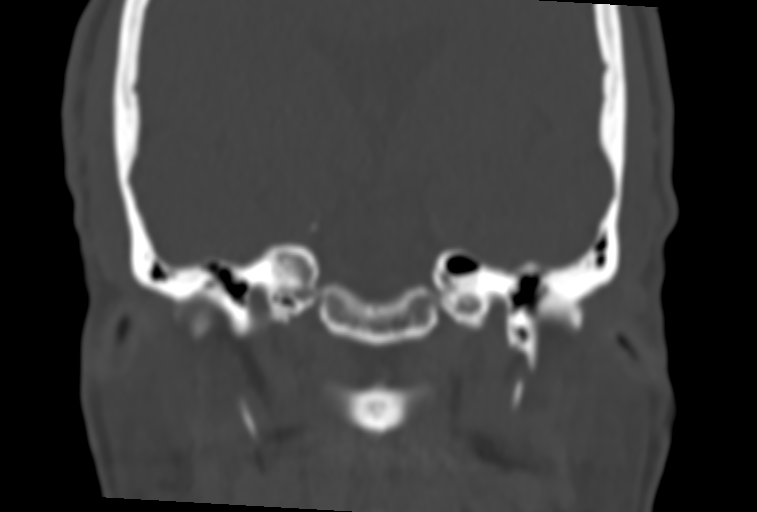

[Series 10: orbits 2.00 hr60 s3 sag sag soft · sagittal · 0.26mm/px · 2 of 98 slices shown]
[im 33/98  bone]
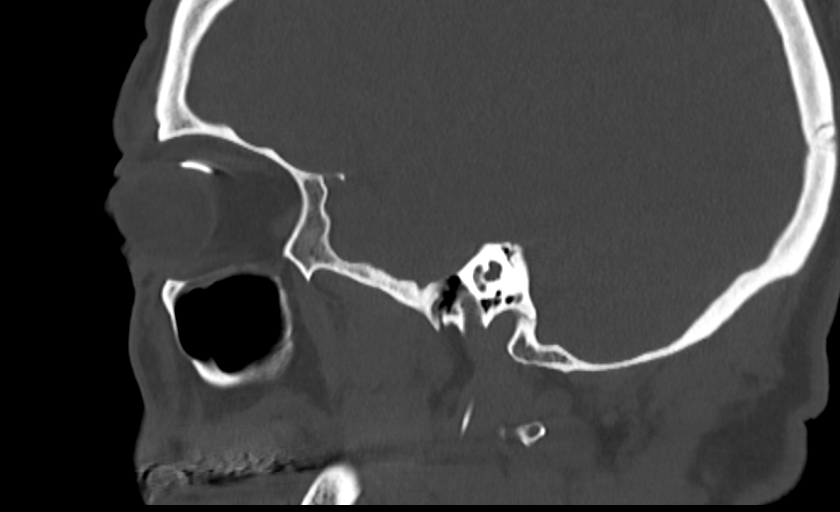
[im 65/98  bone]
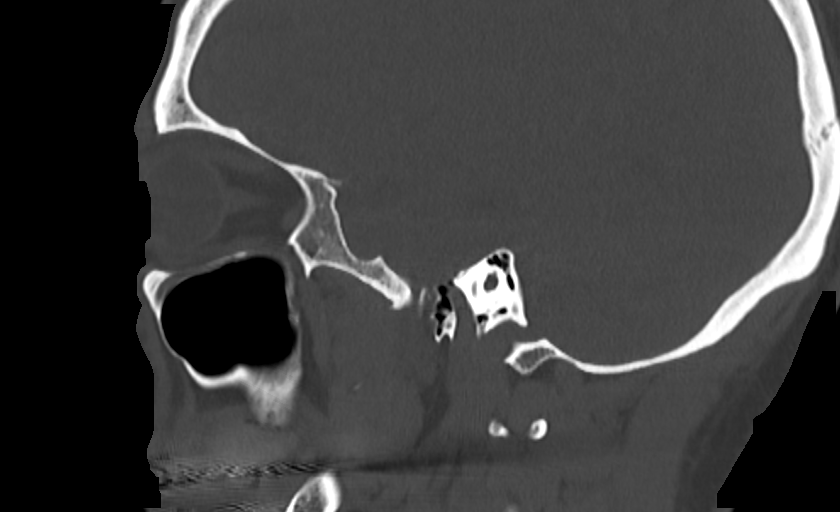

[14 of 47 positions shown; findings below may reference images not displayed]

FINDINGS: Orbits: Glaucoma valve implant in place on the right. Slightly
atypical positioning, with fluid-filled space between the implant
and the posterior globe. This space measures almost 5 mm. Otherwise,
no finding of note. The patient does appear to have a degree of
exophthalmos on the right. The globes themselves appear normal.
Optic nerves are normal. Extra-ocular muscles are normal. Orbital
fat is normal.

Visualized sinuses: Clear

Soft tissues: Normal

Limited intracranial: Normal
IMPRESSION: Glaucoma valve implant on the right. Positioning appears slightly
atypical, as there is a 5 mm space between the implant and the
posterior globe. Exophthalmos on the right without evidence of other
mass lesion or cause.

## 2018-09-19 ENCOUNTER — Encounter (INDEPENDENT_AMBULATORY_CARE_PROVIDER_SITE_OTHER): Payer: Self-pay | Admitting: Orthopedic Surgery

## 2018-09-19 ENCOUNTER — Ambulatory Visit (INDEPENDENT_AMBULATORY_CARE_PROVIDER_SITE_OTHER): Payer: 59 | Admitting: Orthopedic Surgery

## 2018-09-19 VITALS — Ht 70.0 in | Wt 175.8 lb

## 2018-09-19 DIAGNOSIS — M79671 Pain in right foot: Secondary | ICD-10-CM

## 2018-09-19 NOTE — Progress Notes (Signed)
Office Visit Note   Patient: [name]           Date of Birth: Nov 24, 1950           MRN: [id number] Visit Date: 09/19/2018              Requested by: Denita Lung, MD [address] [place], [place] [address] PCP: Denita Lung, MD  Chief Complaint  Patient presents with  . Right Foot - Pain      HPI: Patient is a 68 year old gentleman who is 1 year status post chevron and Weil osteotomy.  He is currently on allopurinol for his gout his last uric acid was 6.1 well-controlled.  Patient complains of pain along the medial border of the right foot pain beneath the metatarsal heads as well as pain laterally.  Assessment & Plan: Visit Diagnoses:  1. Pain in right foot     Plan: Discussed with the progressive posterior tibial tendon insufficiency and progressive loss of arch he is rolling his foot medially and overloading the medial column as well as overloading the forefoot.  A new metatarsal pad was placed this seemed to work better than the previous pad.  Patient was given instructions to obtain a pair of sole orthotics with a metatarsal bar to provide better support these are heat multiple.  He will follow-up once he obtains these.  Follow-Up Instructions: Return if symptoms worsen or fail to improve.   Ortho Exam  Patient is alert, oriented, no adenopathy, well-dressed, normal affect, normal respiratory effort. Examination patient has progressive arch loss due to posterior tibial tendon insufficiency his foot is rolling over into pronation and is overloading the medial column he has good dorsiflexion of the ankle with no Achilles contracture but he is also overloading the forefoot with his posterior tibial tendon insufficiency.  A metatarsal pad was placed to unload the forefoot this was adjusted patient felt that this did feel more comfortable.  Imaging: No results found. No images are attached to the encounter.  Labs: Lab Results  Component Value Date   LABURIC 6.1 08/22/2018   LABURIC 4.6 11/29/2017     Lab Results  Component Value Date   ALBUMIN 3.7 04/19/2018   ALBUMIN 4.5 04/18/2018   ALBUMIN 4.5 09/29/2017   LABURIC 6.1 08/22/2018   LABURIC 4.6 11/29/2017    Body mass index is 25.22 kg/m.  Orders:  No orders of the defined types were placed in this encounter.  No orders of the defined types were placed in this encounter.    Procedures: No procedures performed  Clinical Data: No additional findings.  ROS:  All other systems negative, except as noted in the HPI. Review of Systems  Objective: Vital Signs: Ht 5\' 10"  (1.778 m)   Wt 175 lb 12.8 oz (79.7 kg)   BMI 25.22 kg/m   Specialty Comments:  No specialty comments available.  PMFS History: Patient Active Problem List   Diagnosis Date Noted  . SBO (small bowel obstruction) ([facility]) 04/18/2018  . DOE (dyspnea on exertion) 09/27/2017  . Bunion of great toe of right foot 07/14/2017  . Claw toe, acquired, right 07/14/2017  . 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  . Medication management 10/09/2016  . Plantar fasciitis of right foot 02/19/2015  . Depression 08/14/2014  . Family history of heart disease in male family member before age 91 04/26/2014  . Mild cognitive impairment 03/13/2014  . 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 Hyperlipidemia with target  LDL less than 100   . Abdominal aortic atherosclerosis ([place])   . Moderate essential hypertension 02/25/2011  . Arthropathy 02/25/2011  . HAV (hallux abducto valgus) 02/25/2011   Past Medical History:  Diagnosis Date  . Allergy   . Arthritis   . BPH (benign prostatic hyperplasia)   . Complication of anesthesia    Pt. stated he had a reaction that ended in him requiring urinary cath placement  . Diverticulosis   . GERD (gastroesophageal reflux disease)    esophageal spasms  . Glaucoma   . Gout   . Head injury, closed, with concussion   . Hepatitis C     chronic - Has been treated with Harvoni  . HLD (hyperlipidemia)    statin intolerant (Crestor & Simvastatin) - Taking Livalo 1mg  / week  . Hypertension   . Parkinson's disease ([place])   . Plantar fasciitis    right  . PVD (peripheral vascular disease) ([place])    With no claudication; only mild abdominal aortic atherosclerosis noted on ultrasound.  . Thoracic ascending aortic aneurysm ([facility])    4.2 cm ascending TAA 09/2016 CT, 1 yr f/u rec  . Ulcer     Family History  Problem Relation Age of Onset  . Heart disease Mother   . Cancer Paternal Grandfather   . Cancer Sister     Past Surgical History:  Procedure Laterality Date  . CARDIAC CATHETERIZATION  2005   30% Cx. Dr. Melvern Banker  . cataract surgery Left 11/05/2014  . COLONOSCOPY    . ESOPHAGOGASTRODUODENOSCOPY N/A 05/02/2015   Procedure: ESOPHAGOGASTRODUODENOSCOPY (EGD);  Surgeon: Ronald Lobo, MD;  Location: [facility];  Service: Endoscopy;  Laterality: N/A;  . ESOPHAGOGASTRODUODENOSCOPY  05/02/2015   no source of pt chest pain endoscopically evident. small hiatal hernia.  [name] EYE SURGERY    . MEMBRANE PEEL Left 03/14/2014   Procedure: MEMBRANE PEEL; ENDOLASER;  Surgeon: Hurman Horn, MD;  Location: [facility];  Service: Ophthalmology;  Laterality: Left;  . NM MYOVIEW LTD  2011   Neg Ischemia or infarct.  [name] NM MYOVIEW LTD  10/2015   LOW RISK. Small, fixed basal lateral defect - likely diaphragmatic attenuation. EF 69%  . PARS PLANA VITRECTOMY Left 03/14/2014   Procedure: PARS PLANA VITRECTOMY WITH 25 GAUGE;  Surgeon: Hurman Horn, MD;  Location: [facility];  Service: Ophthalmology;  Laterality: Left;  . TONSILLECTOMY    . TRANSTHORACIC ECHOCARDIOGRAM  2013   Normal EF. No significant Valve Disease  . UPPER GASTROINTESTINAL ENDOSCOPY    . WEIL OSTEOTOMY Right 09/01/2017   Procedure: RIGHT GREAT TOE CHEVRON AND WEIL OSTEOTOMY 2ND METATARSAL;  Surgeon: Newt Minion, MD;  Location: [facility];  Service: Orthopedics;  Laterality: Right;   Social  History   Occupational History  . 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 Smoking status: Former Smoker    Types: Cigars    Last attempt to quit: 07/27/1988    Years since quitting: 30.1  . Smokeless tobacco: Never Used  Substance and Sexual Activity  . Alcohol use: Yes    Alcohol/week: 3.0 standard drinks    Types: 1 Glasses of wine, 1 Cans of beer, 1 Shots of liquor per week    Comment: occasional  . Drug use: No  . Sexual activity: Yes

## 2018-09-27 ENCOUNTER — Telehealth: Payer: Self-pay | Admitting: Cardiology

## 2018-09-27 NOTE — Telephone Encounter (Signed)
Called patient and LVM to call and schedule appointment with Dr. Ellyn Hack.

## 2018-10-03 MED FILL — TIMOLOL 0.5% EYE DROPS: 0.5 | 50 days supply | Qty: 5 | Fill #1 | Status: TO

## 2018-10-13 MED FILL — IRBESARTAN 150 MG TABLET: 150 | 30 days supply | Qty: 30 | Fill #2

## 2018-10-18 ENCOUNTER — Other Ambulatory Visit: Payer: Self-pay | Admitting: Cardiology

## 2018-10-18 MED FILL — ALFUZOSIN HCL ER 10 MG TAB: 10 | 30 days supply | Qty: 30 | Fill #0

## 2018-11-02 ENCOUNTER — Telehealth: Payer: Self-pay | Admitting: Cardiology

## 2018-11-02 ENCOUNTER — Other Ambulatory Visit: Payer: Self-pay | Admitting: Cardiology

## 2018-11-02 DIAGNOSIS — N401 Enlarged prostate with lower urinary tract symptoms: Secondary | ICD-10-CM | POA: Diagnosis not present

## 2018-11-02 DIAGNOSIS — R350 Frequency of micturition: Secondary | ICD-10-CM | POA: Diagnosis not present

## 2018-11-02 DIAGNOSIS — R351 Nocturia: Secondary | ICD-10-CM | POA: Diagnosis not present

## 2018-11-02 MED ORDER — VERAPAMIL HCL ER 240 MG PO TBCR: 240.0000 mg | EXTENDED_RELEASE_TABLET | Freq: Every day | ORAL | 1 refills | Status: DC

## 2018-11-02 MED FILL — VERAPAMIL HCL ER 240 MG TBC: 240 | 90 days supply | Qty: 90 | Fill #0

## 2018-11-02 NOTE — Telephone Encounter (Signed)
New Message  Patient states that he needs prior authorization for verapamil (CALAN-SR) 240 MG CR tablet before [facility] can fill this prescription.

## 2018-11-03 ENCOUNTER — Other Ambulatory Visit: Payer: Self-pay

## 2018-11-03 MED ORDER — VERAPAMIL HCL ER 240 MG PO TBCR: 240.0000 mg | EXTENDED_RELEASE_TABLET | Freq: Every day | ORAL | 1 refills | Status: DC

## 2018-11-04 ENCOUNTER — Telehealth: Payer: Self-pay | Admitting: Cardiology

## 2018-11-04 NOTE — Telephone Encounter (Signed)
Called pt no answer, left msg to call back  

## 2018-11-04 NOTE — Telephone Encounter (Signed)
New Message    Pt c/o BP issue: STAT if pt c/o blurred vision, one-sided weakness or slurred speech  1. What are your last 5 BP readings?  140/100 for the last 3 days   2. Are you having any other symptoms (ex. Dizziness, headache, blurred vision, passed out)? Slight headache , lightheaded   3. What is your BP issue? Pts bp is elevated, pt says he has been taking his medication  Pt says the only thing he has been doing different is taking Omega XL

## 2018-11-05 NOTE — Telephone Encounter (Signed)
Follow-up BP.  If stays > 140/90 average for > 1 week, we will increase Irbesartan to 300 mg.  Glenetta Hew, MD

## 2018-11-07 MED FILL — TIMOLOL 0.5% EYE DROPS: 0.5 | 50 days supply | Qty: 5 | Fill #0

## 2018-11-07 NOTE — Telephone Encounter (Signed)
Pt notified to monitor BP and call office with results. Pt voiced understanding.

## 2018-11-07 NOTE — Telephone Encounter (Signed)
Called pt, no answer. LMTCB

## 2018-11-08 ENCOUNTER — Telehealth: Payer: Self-pay | Admitting: *Deleted

## 2018-11-08 NOTE — Telephone Encounter (Signed)
Left message to call back to schedule a televisit with dr Ellyn Hack 4/20

## 2018-11-09 NOTE — Telephone Encounter (Signed)
Spoke to patient - agree to virtual visit- instruction given      TELEPHONE CALL NOTE  This patient has been deemed a candidate for follow-up tele-health visit to limit community exposure during the Covid-19 pandemic. I spoke with the patient via phone to discuss instructions.  The patient was advised to review the section on consent for treatment as well. The patient will receive a phone call 2-3 days prior to their E-Visit at which time consent will be verbally confirmed. A Virtual Office Visit appointment type has been scheduled for 4/20 at 3:40 pm with  DR [facility]  - patient prefers telephone type.    Raiford Simmonds, RN 11/09/2018 11:53 AM

## 2018-11-09 NOTE — Telephone Encounter (Signed)
Follow up    Patient is returning call. He does not want to do a televisit he would like to be seen in the office because his BP is going up some. Please call [phone]

## 2018-11-10 ENCOUNTER — Telehealth: Payer: Self-pay | Admitting: Cardiology

## 2018-11-10 NOTE — Telephone Encounter (Signed)
Spoke with patient who confirmed all demographics. Actively uses My Chart.

## 2018-11-10 NOTE — Telephone Encounter (Signed)
rn aware schedule for virtual visit 4/20

## 2018-11-14 ENCOUNTER — Encounter: Payer: Self-pay | Admitting: Cardiology

## 2018-11-14 ENCOUNTER — Telehealth (INDEPENDENT_AMBULATORY_CARE_PROVIDER_SITE_OTHER): Payer: 59 | Admitting: Cardiology

## 2018-11-14 VITALS — BP 139/96 | Wt 168.0 lb

## 2018-11-14 DIAGNOSIS — Z8249 Family history of ischemic heart disease and other diseases of the circulatory system: Secondary | ICD-10-CM | POA: Diagnosis not present

## 2018-11-14 DIAGNOSIS — I700 Atherosclerosis of aorta: Secondary | ICD-10-CM | POA: Diagnosis not present

## 2018-11-14 DIAGNOSIS — I10 Essential (primary) hypertension: Secondary | ICD-10-CM | POA: Diagnosis not present

## 2018-11-14 DIAGNOSIS — E785 Hyperlipidemia, unspecified: Secondary | ICD-10-CM | POA: Diagnosis not present

## 2018-11-14 MED ORDER — IRBESARTAN 300 MG PO TABS: 300.0000 mg | ORAL_TABLET | ORAL | 3 refills | Status: DC

## 2018-11-14 MED ORDER — PITAVASTATIN CALCIUM 1 MG PO TABS: 1.0000 mg | ORAL_TABLET | ORAL | 3 refills | Status: DC

## 2018-11-14 MED ORDER — VERAPAMIL HCL ER 240 MG PO TBCR: 240.0000 mg | EXTENDED_RELEASE_TABLET | Freq: Every day | ORAL | 3 refills | Status: DC

## 2018-11-14 MED FILL — IRBESARTAN 300 MG TABS: 300 | 90 days supply | Qty: 90 | Fill #0

## 2018-11-14 MED FILL — LIVALO 1 MG TABLET: 1 | 84 days supply | Qty: 24 | Fill #0

## 2018-11-14 NOTE — Assessment & Plan Note (Signed)
Risk factors of hypertension and hyperlipidemia.  Creasing ARB dose  He has some chest pain but is all resting chest pain and usually associated with food.  This is been evaluated in the past with a negative evaluation.  My suspicion is that this discomfort is related to esophageal spasm as opposed to coronary disease

## 2018-11-14 NOTE — Progress Notes (Signed)
Virtual Visit via Video Note   This visit type was conducted due to national recommendations for restrictions regarding the COVID-19 Pandemic (e.g. social distancing) in an effort to limit this patient's exposure and mitigate transmission in our community.  Due to his co-morbid illnesses, this patient is at least at moderate risk for complications without adequate follow up.  This format is felt to be most appropriate for this patient at this time.  All issues noted in this document were discussed and addressed.  A limited physical exam was performed with this format.  Please refer to the patient's chart for his consent to telehealth for [facility].   Patient has given verbal permission to conduct this visit via virtual appointment and to bill insurance 11/09/2018 11:59 PM     Evaluation Performed:  Follow-up visit  Date:  11/14/2018   ID:  [name], [name] 1951-04-25, MRN [id number]  Patient Location: Home Provider Location: Home  PCP:  Denita Lung, MD  Cardiologist:  No primary care provider on file. Glenetta Hew, MD Electrophysiologist:  None   Chief Complaint: Annual follow-up  History of Present Illness:    [name] is a 68 y.o. male with PMH notable for multiple cardiac risk factors who presents via audio/video conferencing for a telehealth visit today as an annual follow-up.  He is a former patient of Dr. Myrtice Lauth then he saw Dr. Rollene Fare until he retired.  He is being followed for cardiac risk factors of hypertension, hyperlipidemia and had a catheterization performed in 2005 for chest discomfort which only showed minor coronary disease. He also negative Myoview in 2011. He was last seen by Dr. Rollene Fare in June 2014 following a trip to [place] with his wife & I first saw him in December 2014. --He does have Parkinson's which has been relatively well controlled, but does keep him from doing a lot of activity and he is relatively slow with motions.   [name] was last seen in March 2019.  Was doing relatively well.  Just recovering from upper respiratory tract infection type symptoms.  He was noticing some exertional dyspnea associated with that but otherwise was doing okay.  No chest pain or pressure.  Was hoping to get back into his exercise regimen.  Interval History:  Has been doing relatively well until a few weeks ago - started noting some chest discomfort. Usually associated with eating.  Has Sx that he does take NTG.  Late @ night is when it usually will happen - but every now &then -(2-3 x over past 6 months) either sitting or lying down.    Not associated with exercise. Worse with eating ice cream.    Otherwise, he is trying to do some walking and his wife is trying to push his hydration.  He has noted some very significant positional dizziness in the morning when he first gets up.  As a result he had switched his verapamil dosing to nighttime.  Otherwise really from a cardiac standpoint is been stable.  Cardiovascular ROS: positive for - chest pain and as noted above negative for - dyspnea on exertion, edema, irregular heartbeat, orthopnea, palpitations, paroxysmal nocturnal dyspnea, rapid heart rate, shortness of breath or syncope/ near syncope; TIA/amaurosis fugax.   The patient does not have symptoms concerning for COVID-19 infection (fever, chills, cough, or new shortness of breath).  The patient is practicing social distancing.  ROS:  Please see the history of present illness.  Review of Systems  Constitutional: Negative for chills, fever and malaise/fatigue.  HENT: Negative for congestion and nosebleeds.   Respiratory: Negative for cough, sputum production, shortness of breath and wheezing.   Cardiovascular: Positive for chest pain (Per HPI).  Gastrointestinal: Positive for abdominal pain (No further pain since the ileus resolved.) and heartburn.  Musculoskeletal: Positive for joint pain. Negative for falls.   Neurological: Positive for dizziness and tremors (Parkinson's). Negative for focal weakness and weakness.       Tremor and trolled.  But still has a slow gait.  Psychiatric/Behavioral: Negative.  Negative for depression.  All other systems reviewed and are negative.    Sept - ~admitted with Ileus/ partial SBO noticed   Past Medical History:  Diagnosis Date  . Allergy   . Arthritis   . BPH (benign prostatic hyperplasia)   . Complication of anesthesia    Pt. stated he had a reaction that ended in him requiring urinary cath placement  . Diverticulosis   . GERD (gastroesophageal reflux disease)    esophageal spasms  . Glaucoma   . Gout   . Head injury, closed, with concussion   . Hepatitis C    chronic - Has been treated with Harvoni  . HLD (hyperlipidemia)    statin intolerant (Crestor & Simvastatin) - Taking Livalo 1mg  / week  . Hypertension   . Parkinson's disease ([place])   . Plantar fasciitis    right  . PVD (peripheral vascular disease) ([place])    With no claudication; only mild abdominal aortic atherosclerosis noted on ultrasound.  . Thoracic ascending aortic aneurysm (HCC)    4.2 cm ascending TAA 09/2016 CT, 1 yr f/u rec  . Ulcer    Past Surgical History:  Procedure Laterality Date  . CARDIAC CATHETERIZATION  2005   30% Cx. Dr. Melvern Banker  . cataract surgery Left 11/05/2014  . COLONOSCOPY    . ESOPHAGOGASTRODUODENOSCOPY N/A 05/02/2015   Procedure: ESOPHAGOGASTRODUODENOSCOPY (EGD);  Surgeon: Ronald Lobo, MD;  Location: [facility];  Service: Endoscopy;  Laterality: N/A;  . ESOPHAGOGASTRODUODENOSCOPY  05/02/2015   no source of pt chest pain endoscopically evident. small hiatal hernia.  [name] EYE SURGERY    . MEMBRANE PEEL Left 03/14/2014   Procedure: MEMBRANE PEEL; ENDOLASER;  Surgeon: Hurman Horn, MD;  Location: [facility];  Service: Ophthalmology;  Laterality: Left;  . NM MYOVIEW LTD  2011   Neg Ischemia or infarct.  [name] NM MYOVIEW LTD  10/2015   LOW RISK. Small, fixed basal  lateral defect - likely diaphragmatic attenuation. EF 69%  . PARS PLANA VITRECTOMY Left 03/14/2014   Procedure: PARS PLANA VITRECTOMY WITH 25 GAUGE;  Surgeon: Hurman Horn, MD;  Location: [facility];  Service: Ophthalmology;  Laterality: Left;  . TONSILLECTOMY    . TRANSTHORACIC ECHOCARDIOGRAM  2013   Normal EF. No significant Valve Disease  . UPPER GASTROINTESTINAL ENDOSCOPY    . WEIL OSTEOTOMY Right 09/01/2017   Procedure: RIGHT GREAT TOE CHEVRON AND WEIL OSTEOTOMY 2ND METATARSAL;  Surgeon: Newt Minion, MD;  Location: [facility];  Service: Orthopedics;  Laterality: Right;     Current Meds  Medication Sig  . Acetaminophen (ACETAMIN PO) Take 500 mg by mouth every 4 (four) hours as needed (pain).   [name] alfuzosin (UROXATRAL) 10 MG 24 hr tablet Take 10 mg by mouth at bedtime.   . calcium-vitamin D (OSCAL WITH D) 250-125 MG-UNIT tablet Take 1 tablet by mouth daily.  . carbidopa-levodopa (SINEMET IR) 25-100 MG tablet Take 1.5 tablets  by mouth 3 (three) times daily.  . diclofenac sodium (VOLTAREN) 1 % GEL Apply 2 g topically 4 (four) times daily. (Patient taking differently: Apply 2 g topically 4 (four) times daily as needed (pain). )  . irbesartan (AVAPRO) 300 MG tablet Take 1 tablet (300 mg total) by mouth every morning.  . Multiple Vitamin (MULTIVITAMIN WITH MINERALS) TABS tablet Take 1 tablet by mouth 4 (four) times a week.   . nitroGLYCERIN (NITROSTAT) 0.4 MG SL tablet PLACE 1 TABLET UNDER THE TONGUE EVERY 5 MINUTES AS NEEDED FOR CHEST PAIN. (Patient taking differently: Place 0.4 mg under the tongue every 5 (five) minutes as needed for chest pain. )  . omega-3 acid ethyl esters (LOVAZA) 1 g capsule Take by mouth 2 (two) times daily.  . pantoprazole (PROTONIX) 40 MG tablet Take 40 mg by mouth 2 (two) times daily.   . Pitavastatin Calcium (LIVALO) 1 MG TABS Take 1 tablet (1 mg total) by mouth 2 (two) times a week. Takes on Monday and Friday  . timolol (TIMOPTIC) 0.5 % ophthalmic solution Place 1 drop into  the left eye daily.   . TURMERIC PO Take 1,000 mg by mouth daily.  . verapamil (CALAN-SR) 240 MG CR tablet Take 1 tablet (240 mg total) by mouth at bedtime.  . vitamin B-12 (CYANOCOBALAMIN) 1000 MCG tablet Take 1,000 mcg by mouth 3 (three) times a week.   . [DISCONTINUED] irbesartan (AVAPRO) 150 MG tablet TAKE 1 TABLET BY MOUTH EVERY EVENING.  . [DISCONTINUED] Pitavastatin Calcium (LIVALO) 1 MG TABS Take 1 tablet (1 mg total) by mouth 2 (two) times a week. Takes on Monday and Friday  . [DISCONTINUED] verapamil (CALAN-SR) 240 MG CR tablet Take 1 tablet (240 mg total) by mouth daily.     Allergies:   Patient has no known allergies.   Social History   Tobacco Use  . Smoking status: Former Smoker    Types: Cigars    Last attempt to quit: 07/27/1988    Years since quitting: 30.3  . Smokeless tobacco: Never Used  Substance Use Topics  . Alcohol use: Yes    Alcohol/week: 3.0 standard drinks    Types: 1 Glasses of wine, 1 Cans of beer, 1 Shots of liquor per week    Comment: occasional  . Drug use: No     Family Hx: The patient's family history includes Cancer in his paternal grandfather and sister; Heart disease in his mother.   Prior CV studies:   The following studies were reviewed today: . None  Labs/Other Tests and Data Reviewed:    EKG:  No ECG reviewed.  Recent Labs: 04/19/2018: ALT 6; BUN 19; Creatinine, Ser 1.05; Hemoglobin 14.0; Platelets 258; Potassium 4.3; Sodium 140   Recent Lipid Panel Lab Results  Component Value Date/Time   CHOL 144 09/29/2017 09:27 AM   TRIG 47 09/29/2017 09:27 AM   HDL 46 09/29/2017 09:27 AM   CHOLHDL 3.1 09/29/2017 09:27 AM   CHOLHDL 3.0 07/02/2015 08:46 AM   LDLCALC 89 09/29/2017 09:27 AM    Wt Readings from Last 3 Encounters:  11/14/18 168 lb (76.2 kg)  09/19/18 175 lb 12.8 oz (79.7 kg)  08/24/18 175 lb 12.8 oz (79.7 kg)     Objective:    Vital Signs:  BP (!) 139/96   Wt 168 lb (76.2 kg)   BMI 24.11 kg/m  -- has been 150/100  mmHg; as high as 165/109 - lots of stress with work.   VITAL SIGNS:  reviewed  EYES:  sclerae anicteric, EOMI - Extraocular Movements Intact RESPIRATORY:  normal respiratory effort, symmetric expansion MUSCULOSKELETAL:  no obvious deformities. NEURO:  alert and oriented x 3, no obvious focal deficit PSYCH:  normal affect   ASSESSMENT & PLAN:    Problem List Items Addressed This Visit    Abdominal aortic atherosclerosis ([place]) (Chronic)    No signs or symptoms of PAD/claudication.  There is no suggestion of aneurysmal dilation.  Will probably consider checking abdominal ultrasound next year. Continue blood pressure, cholesterol and glycemic control.      Relevant Medications   omega-3 acid ethyl esters (LOVAZA) 1 g capsule   irbesartan (AVAPRO) 300 MG tablet   verapamil (CALAN-SR) 240 MG CR tablet   Pitavastatin Calcium (LIVALO) 1 MG TABS   Family history of heart disease in male family member before age 79 - Primary (Chronic)    Risk factors of hypertension and hyperlipidemia.  Creasing ARB dose  He has some chest pain but is all resting chest pain and usually associated with food.  This is been evaluated in the past with a negative evaluation.  My suspicion is that this discomfort is related to esophageal spasm as opposed to coronary disease      Hyperlipidemia with target LDL less than 100 (Chronic)    Lipids are actually pretty well controlled as of last March with LDL of 89.  Is due for follow-up labs we will go ahead and order lipid panel and chemistry panel to be checked in the next month or so.  Tolerating Livalo well.      Relevant Medications   omega-3 acid ethyl esters (LOVAZA) 1 g capsule   irbesartan (AVAPRO) 300 MG tablet   verapamil (CALAN-SR) 240 MG CR tablet   Pitavastatin Calcium (LIVALO) 1 MG TABS   Other Relevant Orders   Lipid panel   Comprehensive Metabolic Panel (CMET)   Moderate essential hypertension (Chronic)    We probably need to allow little  bit of permissive hypertension, but his blood pressures have been in the 150s /90s 200s.  Will increase irbesartan to 300 mg and switch the dosing to the morning and change verapamil to p.m.  This will hopefully help avoid some of the morning orthostatic hypotension symptoms.  Taking the irbesartan after breakfast will help      Relevant Medications   omega-3 acid ethyl esters (LOVAZA) 1 g capsule   irbesartan (AVAPRO) 300 MG tablet   verapamil (CALAN-SR) 240 MG CR tablet   Pitavastatin Calcium (LIVALO) 1 MG TABS      COVID-19 Education: The signs and symptoms of COVID-19 were discussed with the patient and how to seek care for testing (follow up with PCP or arrange E-visit).   The importance of social distancing was discussed today.  Time:   Today, I have spent 25 minutes with the patient with telehealth technology discussing the above problems.     Medication Adjustments/Labs and Tests Ordered: Current medicines are reviewed at length with the patient today.  Concerns regarding medicines are outlined above.  Medication Instructions:   Will convert irbesartan to 300 mg daily (but recheck blood pressures in roughly 1 week).  Convert aiming of irbesartan to morning and verapamil in the evening.  Tests Ordered: Orders Placed This Encounter  Procedures  . Lipid panel  . Comprehensive Metabolic Panel (CMET)    Medication Changes: Meds ordered this encounter  Medications  . irbesartan (AVAPRO) 300 MG tablet    Sig: Take 1 tablet (300 mg total)  by mouth every morning.    Dispense:  90 tablet    Refill:  3    GIVING PT 30 DAY SUPPLY EMERGENCY FILL 10/18/18  . verapamil (CALAN-SR) 240 MG CR tablet    Sig: Take 1 tablet (240 mg total) by mouth at bedtime.    Dispense:  90 tablet    Refill:  3  . Pitavastatin Calcium (LIVALO) 1 MG TABS    Sig: Take 1 tablet (1 mg total) by mouth 2 (two) times a week. Takes on Monday and Friday    Dispense:  72 tablet    Refill:  3  -Increased  dose of irbesartan to 300 mg  Disposition:  Follow up in 6 month(s)    Signed, Glenetta Hew, MD  11/14/2018 5:37 PM    [place]

## 2018-11-14 NOTE — Assessment & Plan Note (Signed)
We probably need to allow little bit of permissive hypertension, but his blood pressures have been in the 150s /90s 200s.  Will increase irbesartan to 300 mg and switch the dosing to the morning and change verapamil to p.m.  This will hopefully help avoid some of the morning orthostatic hypotension symptoms.  Taking the irbesartan after breakfast will help

## 2018-11-14 NOTE — Assessment & Plan Note (Signed)
No signs or symptoms of PAD/claudication.  There is no suggestion of aneurysmal dilation.  Will probably consider checking abdominal ultrasound next year. Continue blood pressure, cholesterol and glycemic control.

## 2018-11-14 NOTE — Patient Instructions (Signed)
Medication Instructions:  INCREASE IRBESARTAN TO 300 MG ONCE DAILY IN THE MORNING= 2 OF THE 150 MG TABLETS EVERY MORNING  CHANGE VERAPAMIL TO ONCE DAILY IN THE EVENING If you need a refill on your cardiac medications before your next appointment, please call your pharmacy.   Lab work: Your physician recommends that you return for lab work [facility] If you have labs (blood work) drawn today and your tests are completely normal, you will receive your results only by: [name] MyChart Message (if you have MyChart) OR . A paper copy in the mail If you have any lab test that is abnormal or we need to change your treatment, we will call you to review the results.  Follow-Up: At [facility], you and your health needs are our priority.  As part of our continuing mission to provide you with exceptional heart care, we have created designated Provider Care Teams.  These Care Teams include your primary Cardiologist (physician) and Advanced Practice Providers (APPs -  Physician Assistants and Nurse Practitioners) who all work together to provide you with the care you need, when you need it. You will need a follow up appointment in 6 months.  Please call our office 2 months in advance to schedule this appointment.  You may see Glenetta Hew MD or one of the following Advanced Practice Providers on your designated Care Team:   Rosaria Ferries, PA-C . Jory Sims, DNP, ANP

## 2018-11-14 NOTE — Assessment & Plan Note (Signed)
Lipids are actually pretty well controlled as of last March with LDL of 89.  Is due for follow-up labs we will go ahead and order lipid panel and chemistry panel to be checked in the next month or so.  Tolerating Livalo well.

## 2018-11-21 MED FILL — ALFUZOSIN HCL ER 10 MG TAB: 10 | 90 days supply | Qty: 90 | Fill #0

## 2018-11-24 ENCOUNTER — Telehealth: Payer: Self-pay | Admitting: Cardiology

## 2018-11-24 NOTE — Telephone Encounter (Signed)
Blood pressures look great on current dose of irbesartan.  Continue with the timing of verapamil and irbesartan being separated 1 in the morning 1 in the evening as per telehealth visit note  Glenetta Hew, MD

## 2018-11-24 NOTE — Telephone Encounter (Signed)
New message   B/p readings:   4/21 122/82 4/22 171/122 last reading 131/83 4/23 160/104 4/24 127/89 4/27  112/78 4/28   134/89 4/29   116/71   4/30   128/81

## 2018-11-24 NOTE — Telephone Encounter (Signed)
Pt notified and voiced understanding 

## 2018-11-24 NOTE — Telephone Encounter (Signed)
Call pt to notify. No answer. Left msg to call back.

## 2018-11-25 DIAGNOSIS — E785 Hyperlipidemia, unspecified: Secondary | ICD-10-CM | POA: Diagnosis not present

## 2018-11-25 LAB — COMPREHENSIVE METABOLIC PANEL
ALT: 6 IU/L (ref 0–44)
AST: 15 IU/L (ref 0–40)
Albumin/Globulin Ratio: 1.9 (ref 1.2–2.2)
Albumin: 4.7 g/dL (ref 3.8–4.8)
Alkaline Phosphatase: 60 IU/L (ref 39–117)
BUN/Creatinine Ratio: 11 (ref 10–24)
BUN: 14 mg/dL (ref 8–27)
Bilirubin Total: 0.4 mg/dL (ref 0.0–1.2)
CO2: 23 mmol/L (ref 20–29)
Calcium: 9.4 mg/dL (ref 8.6–10.2)
Chloride: 101 mmol/L (ref 96–106)
Creatinine, Ser: 1.23 mg/dL (ref 0.76–1.27)
GFR calc Af Amer: 69 mL/min/{1.73_m2} (ref >59)
GFR calc non Af Amer: 60 mL/min/{1.73_m2} (ref >59)
Globulin, Total: 2.5 g/dL (ref 1.5–4.5)
Glucose: 91 mg/dL (ref 65–99)
Potassium: 4.2 mmol/L (ref 3.5–5.2)
Sodium: 137 mmol/L (ref 134–144)
Total Protein: 7.2 g/dL (ref 6.0–8.5)

## 2018-11-25 LAB — LIPID PANEL
Chol/HDL Ratio: 3 ratio (ref 0.0–5.0)
Cholesterol, Total: 148 mg/dL (ref 100–199)
HDL: 49 mg/dL (ref >39)
LDL Calculated: 91 mg/dL (ref 0–99)
Triglycerides: 39 mg/dL (ref 0–149)
VLDL Cholesterol Cal: 8 mg/dL (ref 5–40)

## 2018-12-06 ENCOUNTER — Other Ambulatory Visit: Payer: Self-pay

## 2018-12-06 ENCOUNTER — Ambulatory Visit: Payer: 59 | Admitting: Family Medicine

## 2018-12-06 ENCOUNTER — Encounter: Payer: Self-pay | Admitting: Family Medicine

## 2018-12-06 VITALS — BP 120/81 | HR 73 | Wt 168.0 lb

## 2018-12-06 DIAGNOSIS — F329 Major depressive disorder, single episode, unspecified: Secondary | ICD-10-CM

## 2018-12-06 DIAGNOSIS — F32A Depression, unspecified: Secondary | ICD-10-CM

## 2018-12-06 MED ORDER — CITALOPRAM HYDROBROMIDE 20 MG PO TABS: 20.0000 mg | ORAL_TABLET | Freq: Every day | ORAL | 3 refills | Status: DC

## 2018-12-06 NOTE — Progress Notes (Signed)
   Subjective:    Patient ID: [name], male    DOB: 1951/04/16, 68 y.o.   MRN: [id number]  HPI Documentation for virtual telephone encounter.  Documentation for virtual audio and video telecommunications through [place] encounter: The patient was located at home. The provider was located in the office. The patient did consent to this visit and is aware of possible charges through their insurance for this visit. The other persons participating in this telemedicine service was his wife Time spent on call was 10 minutes and in review of previous records >20 minutes total. This virtual service is not related to other E/M service within previous 7 days. He is here for consultation concerning difficulty with fatigue, feeling startled, vivid dreams, anhedonia  He has had difficulty visual issues, Parkinson's disease, mild cognitive impairment, spinal stenosis.  His work is good but stressful.  His daughter was married in December.  His marriage is going well.  He has a previous history of difficulty with depression.     Review of Systems     Objective:   Physical Exam Alert and in no distress with a flat affect.  PHQ 9 score of 7.       Assessment & Plan:  Depression, unspecified depression type - Plan: citalopram (CELEXA) 20 MG tablet He has multiple risk factors contributing to his depression and he is amenable to medication as well as counseling.  He is to call Darryl Hyers for an appointment.  I will place him on Celexa and do a virtual visit with him in approximately 1 month.  His wife is in agreement with this.

## 2018-12-08 DIAGNOSIS — F321 Major depressive disorder, single episode, moderate: Secondary | ICD-10-CM | POA: Diagnosis not present

## 2018-12-09 MED FILL — CITALOPRAM HBR 20 MG TABLET: 20 | 30 days supply | Qty: 30 | Fill #0

## 2018-12-12 DIAGNOSIS — H35372 Puckering of macula, left eye: Secondary | ICD-10-CM | POA: Diagnosis not present

## 2018-12-12 DIAGNOSIS — H34832 Tributary (branch) retinal vein occlusion, left eye, with macular edema: Secondary | ICD-10-CM | POA: Diagnosis not present

## 2018-12-12 DIAGNOSIS — H35352 Cystoid macular degeneration, left eye: Secondary | ICD-10-CM | POA: Diagnosis not present

## 2018-12-12 DIAGNOSIS — H34812 Central retinal vein occlusion, left eye, with macular edema: Secondary | ICD-10-CM | POA: Diagnosis not present

## 2018-12-13 DIAGNOSIS — F321 Major depressive disorder, single episode, moderate: Secondary | ICD-10-CM | POA: Diagnosis not present

## 2018-12-21 MED FILL — CARBIDOPA/LEVO 25/100 TAB: 25-100 | 90 days supply | Qty: 405 | Fill #0

## 2018-12-22 DIAGNOSIS — F321 Major depressive disorder, single episode, moderate: Secondary | ICD-10-CM | POA: Diagnosis not present

## 2019-01-02 DIAGNOSIS — F321 Major depressive disorder, single episode, moderate: Secondary | ICD-10-CM | POA: Diagnosis not present

## 2019-01-09 MED FILL — CITALOPRAM HBR 20 MG TABLET: 20 | 30 days supply | Qty: 30 | Fill #1

## 2019-01-16 DIAGNOSIS — F321 Major depressive disorder, single episode, moderate: Secondary | ICD-10-CM | POA: Diagnosis not present

## 2019-01-30 ENCOUNTER — Other Ambulatory Visit: Payer: Self-pay | Admitting: Cardiology

## 2019-01-30 DIAGNOSIS — F321 Major depressive disorder, single episode, moderate: Secondary | ICD-10-CM | POA: Diagnosis not present

## 2019-01-31 MED FILL — NITROGLYCERIN 0.4 MG TAB SL: 0.4 | 15 days supply | Qty: 25 | Fill #0

## 2019-01-31 NOTE — Telephone Encounter (Signed)
Rx(s) sent to pharmacy electronically.  

## 2019-02-03 MED FILL — CITALOPRAM HBR 20 MG TABLET: 20 | 30 days supply | Qty: 30 | Fill #2

## 2019-02-03 MED FILL — VERAPAMIL HCL ER 240 MG TBC: 240 | 90 days supply | Qty: 90 | Fill #1

## 2019-02-03 MED FILL — IRBESARTAN 300 MG TABLET: 300 | 30 days supply | Qty: 30 | Fill #1

## 2019-02-13 ENCOUNTER — Telehealth: Payer: Self-pay | Admitting: Orthopedic Surgery

## 2019-02-15 MED FILL — LIVALO 1 MG TABLET: 1 | 84 days supply | Qty: 24 | Fill #1

## 2019-02-15 MED FILL — TIMOLOL 0.5% EYE DROPS: 0.5 | 50 days supply | Qty: 5 | Fill #1

## 2019-02-15 MED FILL — ALFUZOSIN HCL ER 10 MG TAB: 10 | 90 days supply | Qty: 90 | Fill #1

## 2019-02-22 ENCOUNTER — Ambulatory Visit: Payer: 59 | Admitting: Adult Health

## 2019-02-23 ENCOUNTER — Encounter: Payer: Self-pay | Admitting: Family Medicine

## 2019-02-23 ENCOUNTER — Other Ambulatory Visit: Payer: Self-pay

## 2019-02-23 ENCOUNTER — Ambulatory Visit: Payer: 59 | Admitting: Family Medicine

## 2019-02-23 ENCOUNTER — Ambulatory Visit (INDEPENDENT_AMBULATORY_CARE_PROVIDER_SITE_OTHER): Payer: 59 | Admitting: Neurology

## 2019-02-23 ENCOUNTER — Encounter: Payer: Self-pay | Admitting: Neurology

## 2019-02-23 VITALS — BP 117/74 | HR 67 | Temp 97.8°F | Wt 163.0 lb

## 2019-02-23 VITALS — BP 110/78 | HR 75 | Temp 98.1°F | Wt 161.6 lb

## 2019-02-23 DIAGNOSIS — K5900 Constipation, unspecified: Secondary | ICD-10-CM

## 2019-02-23 DIAGNOSIS — K439 Ventral hernia without obstruction or gangrene: Secondary | ICD-10-CM | POA: Diagnosis not present

## 2019-02-23 DIAGNOSIS — G20 Parkinson's disease: Secondary | ICD-10-CM | POA: Diagnosis not present

## 2019-02-23 DIAGNOSIS — G3184 Mild cognitive impairment, so stated: Secondary | ICD-10-CM

## 2019-02-23 MED ORDER — CARBIDOPA-LEVODOPA 25-100 MG PO TABS: 2.0000 | ORAL_TABLET | Freq: Three times a day (TID) | ORAL | 1 refills | Status: DC

## 2019-02-23 MED FILL — CARBIDOPA-LEVODOPA 25-100 T: 25-100 | 68 days supply | Qty: 405 | Fill #0

## 2019-02-23 NOTE — Progress Notes (Signed)
[facility] PATIENT: [name] DOB: Mar 16, 1951   REASON FOR VISIT: Follow-up for tremor mild cognitive impairment, depression HISTORY FROM: Patient  HISTORY OF PRESENT ILLNESS:HISTORY:63 year African American male who since last year and a half has noticed increasing tremors mainly in the left arm and leg and to a lesser degree in the right arm as well. He states the tremors were quite mild but became more pronounced after a minor accident while staying [place] the rental [place] in [place]. He fell down 12 flights of steps and hit a concrete slab and had a concussion. He and some minor bruises and knee injury which took several months to reck of her period CT scan of the head was done 2 days later which was unremarkable. Since then he has had some walking difficulty but this may be related to his knee pain. He says that his feet do at times gets stuck in his started walking with a stooped posture. He however does not describe typical festination. He has resting and intermittent action tremor claiming the left arm and leg the tremor does improve with activities. The tremor does not seem to interfere with most of his routine. He denies significant bradykinesia, and drooling of saliva or micrographia. He has been evaluated by neurologists Dr. Reginia Forts at [facility] neurology but he is unable to tell me for the diagnosis was. He was not told that this may be Parkinson's and was not tried on dopaminergic medications. He did have an MRI scan and some lab work but I do not have those results for my review available today. Patient is here today for a second opinion as he feels his tremor is not getting better. He does admit to some light masonry hallucinations as well as restless sleep thrashing of his legs. He denies significant memory or cognitive difficulties. He has not noticed any particular effect of alcohol on his tremor. Is no family history of tremors. Patient does have  chronic hepatitis C and is planning to start treatment with dapsone. He has had no seizures, significant head injury with major loss of consciousness, stroke, TIAs or significant neurological problems.  Update 08/14/2014 : He returns for follow-up after last visit 3 months ago. He feels his tremors are about the same. He had tried reducing the dose of Sinemet but noticed that his tremors got worse and hence he went back up to the original dose which is 25/100 one tablet 3 times daily. He still feels occasionally confused and at times secondary to some cells but he admits that he worries a lot. He also admits to feeling depressed, getting tired easily and not having initiated. He has not been on any medications for depression. Patient denies significant drooling of saliva, bradykinesia, gait or balance problems. He feels his tremor is mild and does not interfere with his activities of daily living. I discussed alternative treatment options including addition of dopamine agonist, anticholinergic or even consideration for deep brain stimulation since his tremor is predominantly unilateral however the patient does not want to consider more aggressive treatment options at the present time. UPDATE 12/26/14 [name], 68 year old black male returns for follow-up. He was last seen by Dr. [name] 08/14/2014. He feels his tremors are better since increasing his carbidopa levodopa to 1-1/2 tablets 3 times daily. He is tolerating it well without dizziness, sleepiness, nightmares or hallucinations A MRI Brain with and without Contrast completed on 07/29/2011 was normal. The Mini-Mental status exam today is unchanged 29 out  of 30. He did not follow-up with his primary care for  complaints of depression. He denies any significant drooling bradykinesia falls or balance problems. His tremor does not interfere with his activities of daily living. He returns for reevaluation Update 07/03/2015 : He returns for follow-up after last  visit 6 months ago. He states his tremors continue to do well and he seems to have responded quite well to increased dose of Sinemet 25/100 1-1/2 tablet 3 times daily. Is tolerating it well without any dizziness, sleepiness, nightmares, hallucinations, nausea or diarrhea. He continues to have mild memory difficulties but his has not been on any treatment for depression and in fact has not seen his primary care physician for the same for quite some time. Update 08/11/2016 PS : He returns for follow-up after last visit year ago. He states his tremors as well as memory loss or more or less unchanged. Continues to have intermittent tremors in the left hand mainly. The tremors fluctuate he has good days and bad days. He is currently taking Sinemet 25/100 one and half tablet 3 times daily. He complains of mild fatigue and daytime sleepiness. He denies hallucinations, delusions, dizziness or upset stomach. He does not want to try higher dose because of fear of side effects. Continues to have short-term memory difficulties. He has not been quite compulsive about taking notes and using memory compensation strategies but plans to do so. He has no new complaints today. UPDATE 07/19/2018CM [name], 68 year old male returns for follow-up with tremors and mild cognitive impairment which he says is stable. He continues to have a resting tremor in the left hand which is intermittent. He continues to have good and bad days. He remains on Sinemet 25 100 (1 and half tablets) 3 times daily. He does little exercise he is not doing any memory compensation strategies at this time and was encouraged to do so. He denies any falls. He denies any hallucinations or increased confusion. He denies any freezing spells. He does complain of left hip pain He returns for reevaluation UPDATE 1/22/2019CM [name], 39 he denies 40-year-old male returns for follow-up with a history of Parkinson's disease.  He continues to have mild resting tremor in  the left hand which is intermittent.  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  He remains on Sinemet 3 times a day however he is taking his medication with a meal.  He complains of sometimes being lightheaded when he stands up too quickly.  He was encouraged to sit on the side of the bed and then get up slightly.  He was also encouraged to stay well-hydrated.  He has not been exercising.  He denies any hallucinations or increased confusion.  He has not had any freezing spells.  He returns for reevaluation 02/15/18 UPDATE:JV  Patient returns today for six-month follow-up.  He continues to take Sinemet 3 times daily with the first dose around 6 AM (1 hour prior to eating breakfast), second dose 11 AM (1 hour prior to eating lunch) and then third dose between 5:30 and 6 PM (eats dinner around 6:30-7 PM).  Patient denies any increase in symptoms between doses but does state if he is late on a dose, he will start having increased tremors in his left upper extremity and left lower extremity.  He continues to complain of short-term memory issue and is unable to state whether it is worsening but he states he notices it more.  He did obtain a large book full  avoid games but does this approximately 1 time per week.  Recommended to do these activities at least once daily.  Patient also has complaints of feeling as though he overanalyzes certain situations and feeling increased fatigue.  Patient does endorse increased stress recently but does feel somewhat situational.  Provided patient with stress relaxation techniques and advised him if this continues, to follow-up with PCP for possible medication management.  He denies any hallucinations or increased confusion.  He denies any freezing spells. UPDATE 1/29/2020CM [name], 68 year old male returns for follow-up with a history of Parkinson's disease and mild cognitive impairment.  He denies any freezing spells any stiffness any falls.  He denies any difficulty  swallowing.  He has spinal stenosis and had recent injection.  He is seeing Dr. Sharol Given for a foot problem.  He is currently on Sinemet 3 times a day before meals.  He has not been exercising and was encouraged to do so.  He has not had any hallucinations or increased confusion.  Noted very intermittent left tremor of the hand.  Patient reports some anxiety in public.  He was encouraged to perform stress relaxation techniques.  He returns for reevaluation  : Update 02/23/2019 :  he returns for follow-up after last visit 6 months ago.  He states his tremors are doing well until a month ago when he is noticed worsening of his tremor both at rest as well as using his hands.  He remains on Sinemet 25/100 mg 1-1/2 tablet 3 times daily.  Is tolerating them well without any nausea, diarrhea, dizziness, sleepiness or hallucinations.  He states his short-term memory difficulties are unchanged.  He has not been socializing a lot or being active but plans to do that soon.  He has no other complaints.  He denies significant drooling, bradykinesia, stooped posture gait or balance problems REVIEW OF SYSTEMS: Full 14 system review of systems performed and notable only for those listed, all tremors and memory difficulties only and all other systems negative ALLERGIES: No Known Allergies  HOME MEDICATIONS: Outpatient Medications Prior to Visit  Medication Sig Dispense Refill  . Acetaminophen (ACETAMIN PO) Take 500 mg by mouth every 4 (four) hours as needed (pain).     [name] alfuzosin (UROXATRAL) 10 MG 24 hr tablet Take 10 mg by mouth at bedtime.     [name] allopurinol (ZYLOPRIM) 100 MG tablet TAKE 1 TABLET BY MOUTH TWICE A DAY 60 tablet 2  . calcium-vitamin D (OSCAL WITH D) 250-125 MG-UNIT tablet Take 1 tablet by mouth daily.    . citalopram (CELEXA) 20 MG tablet Take 1 tablet (20 mg total) by mouth daily. 30 tablet 3  . diclofenac sodium (VOLTAREN) 1 % GEL Apply 2 g topically 4 (four) times daily. (Patient taking differently:  Apply 2 g topically 4 (four) times daily as needed (pain). ) 100 g 0  . ibuprofen (ADVIL,MOTRIN) 200 MG tablet Take 400 mg by mouth 2 (two) times daily as needed for headache or moderate pain.    [name] irbesartan (AVAPRO) 300 MG tablet Take 1 tablet (300 mg total) by mouth every morning. 90 tablet 3  . Multiple Vitamin (MULTIVITAMIN WITH MINERALS) TABS tablet Take 1 tablet by mouth 4 (four) times a week.     . nitroGLYCERIN (NITROSTAT) 0.4 MG SL tablet PLACE 1 TABLET UNDER THE TONGUE EVERY 5 MINUTES AS NEEDED FOR CHEST PAIN. 25 tablet 3  . omega-3 acid ethyl esters (LOVAZA) 1 g capsule Take by mouth 2 (two) times daily.    [name]  pantoprazole (PROTONIX) 40 MG tablet Take 40 mg by mouth 2 (two) times daily.   0  . Pitavastatin Calcium (LIVALO) 1 MG TABS Take 1 tablet (1 mg total) by mouth 2 (two) times a week. Takes on Monday and Friday 72 tablet 3  . timolol (TIMOPTIC) 0.5 % ophthalmic solution Place 1 drop into the left eye daily.     . traMADol (ULTRAM) 50 MG tablet Take 50 mg by mouth every 6 (six) hours as needed.    . TURMERIC PO Take 1,000 mg by mouth daily.    . verapamil (CALAN-SR) 240 MG CR tablet Take 1 tablet (240 mg total) by mouth at bedtime. 90 tablet 3  . vitamin B-12 (CYANOCOBALAMIN) 1000 MCG tablet Take 1,000 mcg by mouth 3 (three) times a week.     . carbidopa-levodopa (SINEMET IR) 25-100 MG tablet Take 1.5 tablets by mouth 3 (three) times daily. 405 tablet 1   No facility-administered medications prior to visit.     PAST MEDICAL HISTORY: Past Medical History:  Diagnosis Date  . Allergy   . Arthritis   . BPH (benign prostatic hyperplasia)   . Complication of anesthesia    Pt. stated he had a reaction that ended in him requiring urinary cath placement  . Diverticulosis   . GERD (gastroesophageal reflux disease)    esophageal spasms  . Glaucoma   . Gout   . Head injury, closed, with concussion   . Hepatitis C    chronic - Has been treated with Harvoni  . HLD (hyperlipidemia)     statin intolerant (Crestor & Simvastatin) - Taking Livalo 1mg  / week  . Hypertension   . Parkinson's disease ([place])   . Plantar fasciitis    right  . PVD (peripheral vascular disease) ([place])    With no claudication; only mild abdominal aortic atherosclerosis noted on ultrasound.  . Thoracic ascending aortic aneurysm ([facility])    4.2 cm ascending TAA 09/2016 CT, 1 yr f/u rec  . Ulcer     PAST SURGICAL HISTORY: Past Surgical History:  Procedure Laterality Date  . CARDIAC CATHETERIZATION  2005   30% Cx. Dr. Melvern Banker  . cataract surgery Left 11/05/2014  . COLONOSCOPY    . ESOPHAGOGASTRODUODENOSCOPY N/A 05/02/2015   Procedure: ESOPHAGOGASTRODUODENOSCOPY (EGD);  Surgeon: Ronald Lobo, MD;  Location: [facility];  Service: Endoscopy;  Laterality: N/A;  . ESOPHAGOGASTRODUODENOSCOPY  05/02/2015   no source of pt chest pain endoscopically evident. small hiatal hernia.  [name] EYE SURGERY    . MEMBRANE PEEL Left 03/14/2014   Procedure: MEMBRANE PEEL; ENDOLASER;  Surgeon: Hurman Horn, MD;  Location: [facility];  Service: Ophthalmology;  Laterality: Left;  . NM MYOVIEW LTD  2011   Neg Ischemia or infarct.  [name] NM MYOVIEW LTD  10/2015   LOW RISK. Small, fixed basal lateral defect - likely diaphragmatic attenuation. EF 69%  . PARS PLANA VITRECTOMY Left 03/14/2014   Procedure: PARS PLANA VITRECTOMY WITH 25 GAUGE;  Surgeon: Hurman Horn, MD;  Location: [facility];  Service: Ophthalmology;  Laterality: Left;  . TONSILLECTOMY    . TRANSTHORACIC ECHOCARDIOGRAM  2013   Normal EF. No significant Valve Disease  . UPPER GASTROINTESTINAL ENDOSCOPY    . WEIL OSTEOTOMY Right 09/01/2017   Procedure: RIGHT GREAT TOE CHEVRON AND WEIL OSTEOTOMY 2ND METATARSAL;  Surgeon: Newt Minion, MD;  Location: [facility];  Service: Orthopedics;  Laterality: Right;    FAMILY HISTORY: Family History  Problem Relation Age of Onset  . Heart  disease Mother   . Cancer Paternal Grandfather   . Cancer Sister     SOCIAL HISTORY: Social History    Socioeconomic History  . Marital status: Married    Spouse name: barbra gen  . Number of children: 3  . Years of education: AS  . Highest education level: Not on file  Occupational History  . Occupation: self employed    Fish farm manager: [employer]  Social Needs  . Financial resource strain: Not on file  . Food insecurity    Worry: Not on file    Inability: Not on file  . Transportation needs    Medical: Not on file    Non-medical: Not on file  Tobacco Use  . Smoking status: Former Smoker    Types: Cigars    Quit date: 07/27/1988    Years since quitting: 30.5  . Smokeless tobacco: Never Used  Substance and Sexual Activity  . Alcohol use: Yes    Alcohol/week: 3.0 standard drinks    Types: 1 Glasses of wine, 1 Cans of beer, 1 Shots of liquor per week    Comment: occasional  . Drug use: No  . Sexual activity: Yes  Lifestyle  . Physical activity    Days per week: Not on file    Minutes per session: Not on file  . Stress: Not on file  Relationships  . Social Herbalist on phone: Not on file    Gets together: Not on file    Attends religious service: Not on file    Active member of club or organization: Not on file    Attends meetings of clubs or organizations: Not on file    Relationship status: Not on file  . Intimate partner violence    Fear of current or ex partner: Not on file    Emotionally abused: Not on file    Physically abused: Not on file    Forced sexual activity: Not on file  Other Topics Concern  . 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     Wife is retired Therapist, sports from [employer].   He walks roughly 2-3 miles in bed time at least 2-3 times a week. He quit smoking 30 years ago.     PHYSICAL EXAM  Vitals:   02/23/19 1258  BP: 117/74  Pulse: 67  Temp: 97.8 F (36.6 C)  Weight: 73.9 kg   Body mass index is 23.39 kg/m.  Generalized: Frail elderly African-American male  in no acute distress  Head: normocephalic and atraumatic,.   Neck: Supple,  Musculoskeletal: No deformity   Neurological examination   Mentation: Alert. Clock drawing 4/4, able to name 13 animals which walk on 4 legs.  Diminished recall 2/3. MMSE - Mini Mental State Exam 08/24/2018 05/11/2014  Orientation to time 5 5  Orientation to Place 5 5  Registration 3 3  Attention/ Calculation 5 5  Recall 2 2  Language- name 2 objects 2 2  Language- repeat 1 1  Language- follow 3 step command 3 3  Language- read & follow direction 1 1  Write a sentence 1 1  Write a sentence-comments no subject -  Copy design 1 1  Total score 29 29  Follows all commands speech and language fluent.  Negative glabellar tap. Cranial nerve II-XII: Pupils were equal round reactive to light extraocular movements were full, visual field were full on confrontational test. Facial sensation  and strength were normal. hearing was intact to finger rubbing bilaterally. Uvula tongue midline. head turning and shoulder shrug were normal and symmetric.Tongue protrusion into cheek strength was normal. Motor: normal bulk and tone, full strength in the BUE, BLE, fine finger movements normal, no bradykinesia, intermittent left hand resting tremor, no cogwheel rigidity. Sensory: normal and symmetric to light touch, on the face arms and legs  Coordination: finger-nose-finger, heel-to-shin bilaterally, no dysmetria Reflexes: 1+ upper lower and symmetric plantar responses were flexor bilaterally. Gait and Station: Rising up from seated position without assistance, no festination or stooped posture.  Ambulated well in the hall  moderate stride, decreased  arm swing on the left., smooth turning, no assistive device DIAGNOSTIC DATA (LABS, IMAGING, TESTING) - I reviewed patient records, labs, notes, testing and imaging myself where available.  Lab Results  Component Value Date   WBC 9.1 04/19/2018   HGB 14.0 04/19/2018   HCT 41.2  04/19/2018   MCV 92.6 04/19/2018   PLT 258 04/19/2018      Component Value Date/Time   NA 137 11/25/2018 0834   K 4.2 11/25/2018 0834   CL 101 11/25/2018 0834   CO2 23 11/25/2018 0834   GLUCOSE 91 11/25/2018 0834   GLUCOSE 113 (H) 04/19/2018 0411   BUN 14 11/25/2018 0834   CREATININE 1.23 11/25/2018 0834   CREATININE 1.06 06/29/2014 0920   CALCIUM 9.4 11/25/2018 0834   PROT 7.2 11/25/2018 0834   ALBUMIN 4.7 11/25/2018 0834   AST 15 11/25/2018 0834   ALT 6 11/25/2018 0834   ALKPHOS 60 11/25/2018 0834   BILITOT 0.4 11/25/2018 0834   GFRNONAA 60 11/25/2018 0834   GFRAA 69 11/25/2018 0834   Lab Results  Component Value Date   CHOL 148 11/25/2018   HDL 49 11/25/2018   LDLCALC 91 11/25/2018   TRIG 39 11/25/2018   CHOLHDL 3.0 11/25/2018     Lab Results  Component Value Date   TSH 1.950 09/28/2016    ASSESSMENT AND PLAN 68 y.o. year old male  has a medical history of 3.5 year history of resting left arm and leg tremors with mild features of early left sided Parkinson's disease. Good response to Sinemet but recent worsening of tremors. He also complains of mild cognitive impairment which appears stable.    PLAN: I had a long discussion with the patient regarding his parkinsonian tremor which appears to be slightly worse though he appears to be tolerating the current dose of Sinemet IR 25/100 mg well without any side effects.  I recommend he increase Sinemet gradually over the next 3 weeks to 2 tablets 3 times daily.  Have discussed possible side effects and advised him to call me if needed.  I also encouraged him to continue participating in cognitively challenging activities like solving crossword puzzles, playing bridge and sudoku for his mild cognitive impairment which also appears stable.  He will return for follow-up in the future in 6 months or call earlier if necessary I spent 25 minutes in total face to face time with the patient more than 50% of which was spent counseling  and coordination of care, reviewing test results reviewing medications and discussing and reviewing the diagnosis of Parkinson's disease and mild cognitive impairment and importance of exercise and stress relaxation techniques Antony Contras, MD   [facility] [address] [place], [place] [address] [phone]

## 2019-02-23 NOTE — Patient Instructions (Addendum)
Constipation, Adult Constipation is when a person:  Poops (has a bowel movement) fewer times in a week than normal.  Has a hard time pooping.  Has poop that is dry, hard, or bigger than normal. Follow these instructions at home: Eating and drinking   Eat foods that have a lot of fiber, such as: ? Fresh fruits and vegetables. ? Whole grains. ? Beans.  Eat less of foods that are high in fat, low in fiber, or overly processed, such as: ? [place] fries. ? Hamburgers. ? Cookies. ? Candy. ? Soda.  Drink enough fluid to keep your pee (urine) clear or pale yellow. General instructions  Exercise regularly or as told by your doctor.  Go to the restroom when you feel like you need to poop. Do not hold it in.  Take over-the-counter and prescription medicines only as told by your doctor. These include any fiber supplements.  Do pelvic floor retraining exercises, such as: ? Doing deep breathing while relaxing your lower belly (abdomen). ? Relaxing your pelvic floor while pooping.  Watch your condition for any changes.  Keep all follow-up visits as told by your doctor. This is important. Contact a doctor if:  You have pain that gets worse.  You have a fever.  You have not pooped for 4 days.  You throw up (vomit).  You are not hungry.   You lose weight.  You are bleeding from the anus.  You have thin, pencil-like poop (stool). Get help right away if:  You have a fever, and your symptoms suddenly get worse.  You leak poop or have blood in your poop.  Your belly feels hard or bigger than normal (is bloated).  You have very bad belly pain.  You feel dizzy or you faint. This information is not intended to replace advice given to you by your health care provider. Make sure you discuss any questions you have with your health care provider. Document Released: 12/30/2007 Document Revised: 06/25/2017 Document Reviewed: 01/01/2016 Elsevier Patient Education  2020 [place] ahead and start MiraLAX on a regular basis and just follow the direct

## 2019-02-23 NOTE — Patient Instructions (Signed)
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  I recommend he increase Sinemet gradually over the next 3 weeks to 2 tablets 3 times daily.  Have discussed possible side effects and advised him to call me if needed.  I also encouraged him to continue participating in cognitively challenging activities like solving crossword puzzles, playing bridge and sudoku for his mild cognitive impairment which also appears stable.  He will return for follow-up in the future in 6 months or call earlier if necessary

## 2019-02-23 NOTE — Progress Notes (Signed)
   Subjective:    Patient ID: [name], male    DOB: 05-04-1951, 68 y.o.   MRN: [id number]  HPI He complains of a several month history of constipation.  He describes less bowel movements, harder bowel movements and having to strain.  Also of note is over the last several months he has noted a decrease in his exercise pattern and has changed his eating habits, getting more takeout foods.  He also notes some slight mid abdominal bulging especially when he has a BM.  Review of the record indicates he has had a colonoscopy which was negative.   Review of Systems     Objective:   Physical Exam Alert and in no distress.  Abdominal exam shows active bowel sounds.  Small abdominal wall hernia noted proximal to the umbilicus.  It is easily reducible.  No masses or tenderness noted in the abdomen.       Assessment & Plan:   Encounter Diagnoses  Name Primary?  . Constipation, unspecified constipation type Yes  . Abdominal wall hernia   I explained that his change in habits is probably related to decreased exercise and change in his foods.  Recommend he use MiraLAX to keep himself on a regular schedule.  Explained that the abdominal wall hernia is of no major concern unless it continues to grow and/or cause a lot more discomfort.  He was comfortable with that

## 2019-02-27 DIAGNOSIS — M21611 Bunion of right foot: Secondary | ICD-10-CM | POA: Diagnosis not present

## 2019-02-27 DIAGNOSIS — M7741 Metatarsalgia, right foot: Secondary | ICD-10-CM | POA: Diagnosis not present

## 2019-02-27 DIAGNOSIS — M79672 Pain in left foot: Secondary | ICD-10-CM | POA: Diagnosis not present

## 2019-02-27 DIAGNOSIS — M79671 Pain in right foot: Secondary | ICD-10-CM | POA: Diagnosis not present

## 2019-02-27 DIAGNOSIS — M19079 Primary osteoarthritis, unspecified ankle and foot: Secondary | ICD-10-CM | POA: Diagnosis not present

## 2019-03-08 DIAGNOSIS — H401132 Primary open-angle glaucoma, bilateral, moderate stage: Secondary | ICD-10-CM | POA: Diagnosis not present

## 2019-03-08 DIAGNOSIS — H31093 Other chorioretinal scars, bilateral: Secondary | ICD-10-CM | POA: Diagnosis not present

## 2019-03-08 DIAGNOSIS — H35372 Puckering of macula, left eye: Secondary | ICD-10-CM | POA: Diagnosis not present

## 2019-03-08 DIAGNOSIS — H04123 Dry eye syndrome of bilateral lacrimal glands: Secondary | ICD-10-CM | POA: Diagnosis not present

## 2019-03-08 DIAGNOSIS — Z961 Presence of intraocular lens: Secondary | ICD-10-CM | POA: Diagnosis not present

## 2019-03-08 DIAGNOSIS — H4720 Unspecified optic atrophy: Secondary | ICD-10-CM | POA: Diagnosis not present

## 2019-03-08 DIAGNOSIS — H34832 Tributary (branch) retinal vein occlusion, left eye, with macular edema: Secondary | ICD-10-CM | POA: Diagnosis not present

## 2019-03-08 DIAGNOSIS — H34812 Central retinal vein occlusion, left eye, with macular edema: Secondary | ICD-10-CM | POA: Diagnosis not present

## 2019-03-10 ENCOUNTER — Telehealth: Payer: Self-pay | Admitting: Neurology

## 2019-03-10 MED FILL — CITALOPRAM HBR 20 MG TABLET: 20 | 30 days supply | Qty: 30 | Fill #3

## 2019-03-10 MED FILL — IRBESARTAN 300 MG TABS: 300 | 30 days supply | Qty: 30 | Fill #2

## 2019-03-10 NOTE — Telephone Encounter (Signed)
Pt would like the RN to call back to discuss the pt's medication carbidopa-levodopa (SINEMET IR) 25-100 MG tablet. Please advise.

## 2019-03-13 NOTE — Telephone Encounter (Signed)
I called pt about his sinemet dosage. PT stated since Dr [name] increase it to 2 tablets tid he is having some side effects from it. Pt stated it was increase because he felt the tremors were getting worse. PT stated he is having a hard time concentrating, and feels disoriented at times. He would like for the sinemet dosage to be adjusted back to the old dosage of 1.5 tablets by mouth three times a day.I stated message will be sent back to Dr. [name] for review.Pt verbalized understanding.

## 2019-03-13 NOTE — Telephone Encounter (Signed)
I am not convinced her side effects are related to increase Sinemet but it is okay to go back to the previous dose of 1.5 tablet 3 times daily and call back in a week to see if he is better

## 2019-03-14 NOTE — Telephone Encounter (Signed)
I called pt to give Dr.Sethi recommendations that he is not convinced the new dosage of 2 tablets tid is causing his symptoms . I stated per Dr. [name] he can go back on the old dosage of 1 and 1/2 sinemet daily. PT stated when he started taking the old dosage his tremors increase.  Pt stated he will go back on the new dosage that Dr.SEthi recommended at last visit of 2 tablets tid. He will try that for two weeks and let us know his progress.

## 2019-03-15 DIAGNOSIS — R351 Nocturia: Secondary | ICD-10-CM | POA: Diagnosis not present

## 2019-03-15 DIAGNOSIS — R8271 Bacteriuria: Secondary | ICD-10-CM | POA: Diagnosis not present

## 2019-03-15 DIAGNOSIS — N401 Enlarged prostate with lower urinary tract symptoms: Secondary | ICD-10-CM | POA: Diagnosis not present

## 2019-03-15 MED FILL — OXYBUTYNIN CL ER 10 MG TAB: 10 | 30 days supply | Qty: 30 | Fill #0

## 2019-04-11 ENCOUNTER — Other Ambulatory Visit: Payer: Self-pay | Admitting: Family Medicine

## 2019-04-11 DIAGNOSIS — F329 Major depressive disorder, single episode, unspecified: Secondary | ICD-10-CM

## 2019-04-11 DIAGNOSIS — F32A Depression, unspecified: Secondary | ICD-10-CM

## 2019-04-11 MED FILL — CITALOPRAM HBR 20 MG TABLET: 20 | 30 days supply | Qty: 30 | Fill #0

## 2019-04-11 MED FILL — IRBESARTAN 300 MG TABS: 300 | 30 days supply | Qty: 30 | Fill #3

## 2019-04-11 NOTE — Telephone Encounter (Signed)
[name] is requesting to fill pt celexa. Please advise [facility]

## 2019-05-08 MED FILL — IRBESARTAN 300 MG TABS: 300 | 30 days supply | Qty: 30 | Fill #4

## 2019-05-08 MED FILL — CITALOPRAM HBR 20 MG TABLET: 20 | 30 days supply | Qty: 30 | Fill #1

## 2019-05-08 MED FILL — VERAPAMIL HCL ER 240 MG TBC: 240 | 90 days supply | Qty: 90 | Fill #0

## 2019-05-15 MED FILL — LIVALO 1 MG TABLET: 1 | 84 days supply | Qty: 24 | Fill #2

## 2019-05-15 MED FILL — TIMOLOL 0.5% EYE DROPS: 0.5 | 90 days supply | Qty: 10 | Fill #0

## 2019-05-17 ENCOUNTER — Other Ambulatory Visit (INDEPENDENT_AMBULATORY_CARE_PROVIDER_SITE_OTHER): Payer: 59

## 2019-05-17 DIAGNOSIS — Z23 Encounter for immunization: Secondary | ICD-10-CM | POA: Diagnosis not present

## 2019-05-24 MED FILL — ALFUZOSIN HCL ER 10 MG TB24: 10 | 90 days supply | Qty: 90 | Fill #2

## 2019-06-07 MED FILL — IRBESARTAN 300 MG TABS: 300 | 30 days supply | Qty: 30 | Fill #5

## 2019-06-07 MED FILL — CITALOPRAM HBR 20 MG TABLET: 20 | 30 days supply | Qty: 30 | Fill #2

## 2019-06-12 DIAGNOSIS — H4720 Unspecified optic atrophy: Secondary | ICD-10-CM | POA: Diagnosis not present

## 2019-06-12 DIAGNOSIS — H34812 Central retinal vein occlusion, left eye, with macular edema: Secondary | ICD-10-CM | POA: Diagnosis not present

## 2019-06-12 DIAGNOSIS — H35352 Cystoid macular degeneration, left eye: Secondary | ICD-10-CM | POA: Diagnosis not present

## 2019-06-12 DIAGNOSIS — H18231 Secondary corneal edema, right eye: Secondary | ICD-10-CM | POA: Diagnosis not present

## 2019-07-10 MED FILL — CITALOPRAM HBR 20 MG TABLET: 20 | 30 days supply | Qty: 30 | Fill #3

## 2019-07-10 MED FILL — IRBESARTAN 300 MG TABS: 300 | 30 days supply | Qty: 30 | Fill #6

## 2019-07-18 MED FILL — NITROGLYCERIN 0.4 MG TAB SL: 0.4 | 15 days supply | Qty: 25 | Fill #1

## 2019-07-19 MED FILL — CARBIDOPA-LEVODOPA 25-100 T: 25-100 | 68 days supply | Qty: 405 | Fill #1

## 2019-08-07 ENCOUNTER — Other Ambulatory Visit: Payer: Self-pay | Admitting: Family Medicine

## 2019-08-07 DIAGNOSIS — F32A Depression, unspecified: Secondary | ICD-10-CM

## 2019-08-07 DIAGNOSIS — F329 Major depressive disorder, single episode, unspecified: Secondary | ICD-10-CM

## 2019-08-07 MED FILL — IRBESARTAN 300 MG TABS: 300 | 30 days supply | Qty: 30 | Fill #7

## 2019-08-07 MED FILL — CITALOPRAM HBR 20 MG TABLET: 20 | 30 days supply | Qty: 30 | Fill #0

## 2019-08-07 MED FILL — VERAPAMIL HCL ER 240 MG TBC: 240 | 90 days supply | Qty: 90 | Fill #1

## 2019-08-07 NOTE — Telephone Encounter (Signed)
[facility] is requesting to fill pt celexa. Please advise [facility]

## 2019-08-14 MED FILL — LIVALO 1 MG TABLET: 1 | 84 days supply | Qty: 24 | Fill #3

## 2019-08-20 ENCOUNTER — Ambulatory Visit: Payer: 59 | Attending: Internal Medicine

## 2019-08-20 DIAGNOSIS — Z23 Encounter for immunization: Secondary | ICD-10-CM | POA: Insufficient documentation

## 2019-08-21 NOTE — Progress Notes (Signed)
   Covid-19 Vaccination Clinic  Name:  [name]    MRN: [id number] DOB: March 28, 1951  08/20/2019  [name] was observed post Covid-19 immunization for 15 minutes without incidence. He was provided with Vaccine Information Sheet and instruction to access the V-Safe system.   [name] was instructed to call 911 with any severe reactions post vaccine: [name] Difficulty breathing  . Swelling of your face and throat  . A fast heartbeat  . A bad rash all over your body  . Dizziness and weakness    Immunizations Administered    Name Date Dose VIS Date Route   Moderna COVID-19 Vaccine 08/20/2019  2:03 PM 0.5 mL 06/27/2019 Intramuscular   Manufacturer: Levan Hurst   Lot: [id number]   [id number]      Documented on behalf of: P. Joya Gaskins

## 2019-08-22 MED FILL — ALFUZOSIN HCL ER 10 MG TB24: 10 | 90 days supply | Qty: 90 | Fill #3

## 2019-09-04 ENCOUNTER — Other Ambulatory Visit (HOSPITAL_COMMUNITY): Payer: Self-pay | Admitting: Neurology

## 2019-09-04 ENCOUNTER — Ambulatory Visit (INDEPENDENT_AMBULATORY_CARE_PROVIDER_SITE_OTHER): Payer: 59 | Admitting: Neurology

## 2019-09-04 ENCOUNTER — Other Ambulatory Visit: Payer: Self-pay

## 2019-09-04 ENCOUNTER — Encounter: Payer: Self-pay | Admitting: Neurology

## 2019-09-04 VITALS — BP 149/102 | HR 63 | Temp 97.5°F | Ht 70.0 in | Wt 160.0 lb

## 2019-09-04 DIAGNOSIS — G3184 Mild cognitive impairment, so stated: Secondary | ICD-10-CM | POA: Diagnosis not present

## 2019-09-04 DIAGNOSIS — G20 Parkinson's disease: Secondary | ICD-10-CM

## 2019-09-04 MED ORDER — TRIHEXYPHENIDYL HCL 2 MG PO TABS: 1.0000 mg | ORAL_TABLET | Freq: Two times a day (BID) | ORAL | 2 refills | Status: DC

## 2019-09-04 MED ORDER — RASAGILINE MESYLATE 0.5 MG PO TABS: 0.5000 mg | ORAL_TABLET | Freq: Every day | ORAL | 2 refills | Status: DC

## 2019-09-04 MED FILL — TRIHEXYPHENIDYL HCL 2 MG TA: 2 | 30 days supply | Qty: 30 | Fill #0

## 2019-09-04 NOTE — Progress Notes (Signed)
[facility] PATIENT: [name] DOB: Mar 16, 1951   REASON FOR VISIT: Follow-up for tremor mild cognitive impairment, depression HISTORY FROM: Patient  HISTORY OF PRESENT ILLNESS:HISTORY:63 year African American male who since last year and a half has noticed increasing tremors mainly in the left arm and leg and to a lesser degree in the right arm as well. He states the tremors were quite mild but became more pronounced after a minor accident while staying [place] the [place]. He fell down 12 flights of steps and hit a concrete slab and had a concussion. He and some minor bruises and knee injury which took several months to reck of her period CT scan of the head was done 2 days later which was unremarkable. Since then he has had some walking difficulty but this may be related to his knee pain. He says that his feet do at times gets stuck in his started walking with a stooped posture. He however does not describe typical festination. He has resting and intermittent action tremor claiming the left arm and leg the tremor does improve with activities. The tremor does not seem to interfere with most of his routine. He denies significant bradykinesia, and drooling of saliva or micrographia. He has been evaluated by neurologists Dr. Reginia Forts at [facility] neurology but he is unable to tell me for the diagnosis was. He was not told that this may be Parkinson's and was not tried on dopaminergic medications. He did have an MRI scan and some lab work but I do not have those results for my review available today. Patient is here today for a second opinion as he feels his tremor is not getting better. He does admit to some light masonry hallucinations as well as restless sleep thrashing of his legs. He denies significant memory or cognitive difficulties. He has not noticed any particular effect of alcohol on his tremor. Is no family history of tremors. Patient does have  chronic hepatitis C and is planning to start treatment with dapsone. He has had no seizures, significant head injury with major loss of consciousness, stroke, TIAs or significant neurological problems.  Update 08/14/2014 : He returns for follow-up after last visit 3 months ago. He feels his tremors are about the same. He had tried reducing the dose of Sinemet but noticed that his tremors got worse and hence he went back up to the original dose which is 25/100 one tablet 3 times daily. He still feels occasionally confused and at times secondary to some cells but he admits that he worries a lot. He also admits to feeling depressed, getting tired easily and not having initiated. He has not been on any medications for depression. Patient denies significant drooling of saliva, bradykinesia, gait or balance problems. He feels his tremor is mild and does not interfere with his activities of daily living. I discussed alternative treatment options including addition of dopamine agonist, anticholinergic or even consideration for deep brain stimulation since his tremor is predominantly unilateral however the patient does not want to consider more aggressive treatment options at the present time. UPDATE 12/26/14 [name], 69 year old black male returns for follow-up. He was last seen by Dr. [name] 08/14/2014. He feels his tremors are better since increasing his carbidopa levodopa to 1-1/2 tablets 3 times daily. He is tolerating it well without dizziness, sleepiness, nightmares or hallucinations A MRI Brain with and without Contrast completed on 07/29/2011 was normal. The Mini-Mental status exam today is unchanged 29 out  of 30. He did not follow-up with his primary care for  complaints of depression. He denies any significant drooling bradykinesia falls or balance problems. His tremor does not interfere with his activities of daily living. He returns for reevaluation Update 07/03/2015 : He returns for follow-up after last  visit 6 months ago. He states his tremors continue to do well and he seems to have responded quite well to increased dose of Sinemet 25/100 1-1/2 tablet 3 times daily. Is tolerating it well without any dizziness, sleepiness, nightmares, hallucinations, nausea or diarrhea. He continues to have mild memory difficulties but his has not been on any treatment for depression and in fact has not seen his primary care physician for the same for quite some time. Update 08/11/2016 PS : He returns for follow-up after last visit year ago. He states his tremors as well as memory loss or more or less unchanged. Continues to have intermittent tremors in the left hand mainly. The tremors fluctuate he has good days and bad days. He is currently taking Sinemet 25/100 one and half tablet 3 times daily. He complains of mild fatigue and daytime sleepiness. He denies hallucinations, delusions, dizziness or upset stomach. He does not want to try higher dose because of fear of side effects. Continues to have short-term memory difficulties. He has not been quite compulsive about taking notes and using memory compensation strategies but plans to do so. He has no new complaints today. UPDATE 07/19/2018CM [name], 69 year old male returns for follow-up with tremors and mild cognitive impairment which he says is stable. He continues to have a resting tremor in the left hand which is intermittent. He continues to have good and bad days. He remains on Sinemet 25 100 (1 and half tablets) 3 times daily. He does little exercise he is not doing any memory compensation strategies at this time and was encouraged to do so. He denies any falls. He denies any hallucinations or increased confusion. He denies any freezing spells. He does complain of left hip pain He returns for reevaluation UPDATE 1/22/2019CM [name], 69 he denies 40-year-old male returns for follow-up with a history of Parkinson's disease.  He continues to have mild resting tremor in  the left hand which is intermittent.  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  He remains on Sinemet 3 times a day however he is taking his medication with a meal.  He complains of sometimes being lightheaded when he stands up too quickly.  He was encouraged to sit on the side of the bed and then get up slightly.  He was also encouraged to stay well-hydrated.  He has not been exercising.  He denies any hallucinations or increased confusion.  He has not had any freezing spells.  He returns for reevaluation 02/15/18 UPDATE:JV  Patient returns today for six-month follow-up.  He continues to take Sinemet 3 times daily with the first dose around 6 AM (1 hour prior to eating breakfast), second dose 11 AM (1 hour prior to eating lunch) and then third dose between 5:30 and 6 PM (eats dinner around 6:30-7 PM).  Patient denies any increase in symptoms between doses but does state if he is late on a dose, he will start having increased tremors in his left upper extremity and left lower extremity.  He continues to complain of short-term memory issue and is unable to state whether it is worsening but he states he notices it more.  He did obtain a large book full  avoid games but does this approximately 1 time per week.  Recommended to do these activities at least once daily.  Patient also has complaints of feeling as though he overanalyzes certain situations and feeling increased fatigue.  Patient does endorse increased stress recently but does feel somewhat situational.  Provided patient with stress relaxation techniques and advised him if this continues, to follow-up with PCP for possible medication management.  He denies any hallucinations or increased confusion.  He denies any freezing spells. UPDATE 1/29/2020CM [name], 69 year old male returns for follow-up with a history of Parkinson's disease and mild cognitive impairment.  He denies any freezing spells any stiffness any falls.  He denies any difficulty  swallowing.  He has spinal stenosis and had recent injection.  He is seeing Dr. Sharol Given for a foot problem.  He is currently on Sinemet 3 times a day before meals.  He has not been exercising and was encouraged to do so.  He has not had any hallucinations or increased confusion.  Noted very intermittent left tremor of the hand.  Patient reports some anxiety in public.  He was encouraged to perform stress relaxation techniques.  He returns for reevaluation  : Update 02/23/2019 :  he returns for follow-up after last visit 6 months ago.  He states his tremors are doing well until a month ago when he is noticed worsening of his tremor both at rest as well as using his hands.  He remains on Sinemet 25/100 mg 1-1/2 tablet 3 times daily.  Is tolerating them well without any nausea, diarrhea, dizziness, sleepiness or hallucinations.  He states his short-term memory difficulties are unchanged.  He has not been socializing a lot or being active but plans to do that soon.  He has no other complaints.  He denies significant drooling, bradykinesia, stooped posture gait or balance problems Update 09/04/2019 : He returns for follow-up after last visit 6 months ago.  He states that his tremors continue to intermittently not do well.  He has no more bad days than good days.  Has noticed some tremor in his legs as well intermittently.  Patient was advised to try higher dose of Sinemet 2 tablets 3 times daily at last visit.  He states he was not able to tolerate this because of dizziness and had to cut back to 1-1/2 3 times daily which is tolerating better.  He is also noticed that his balance is off but he blames this on having some problems in his toes.  He has had no major falls or injuries.  He can still get up easily from his chair.  He denies significant drooling of saliva.  He does have some at times disturbed sleep and reviewed dreams and talks in his sleep.  Gets startled easily. REVIEW OF SYSTEMS: Full 14 system review of  systems performed and notable only for those listed, all tremors, imbalance, dizziness and memory difficulties only and all other systems negative ALLERGIES: No Known Allergies  HOME MEDICATIONS: Outpatient Medications Prior to Visit  Medication Sig Dispense Refill  . Acetaminophen (ACETAMIN PO) Take 500 mg by mouth every 4 (four) hours as needed (pain).     [name] alfuzosin (UROXATRAL) 10 MG 24 hr tablet Take 10 mg by mouth at bedtime.     [name] allopurinol (ZYLOPRIM) 100 MG tablet TAKE 1 TABLET BY MOUTH TWICE A DAY 60 tablet 2  . calcium-vitamin D (OSCAL WITH D) 250-125 MG-UNIT tablet Take 1 tablet by mouth daily.    [name]  carbidopa-levodopa (SINEMET IR) 25-100 MG tablet Take 2 tablets by mouth 3 (three) times daily. 405 tablet 1  . citalopram (CELEXA) 20 MG tablet TAKE 1 TABLET (20 MG TOTAL) BY MOUTH DAILY. 30 tablet 3  . diclofenac sodium (VOLTAREN) 1 % GEL Apply 2 g topically 4 (four) times daily. (Patient taking differently: Apply 2 g topically 4 (four) times daily as needed (pain). ) 100 g 0  . irbesartan (AVAPRO) 300 MG tablet Take 1 tablet (300 mg total) by mouth every morning. 90 tablet 3  . Multiple Vitamin (MULTIVITAMIN WITH MINERALS) TABS tablet Take 1 tablet by mouth 4 (four) times a week.     . nitroGLYCERIN (NITROSTAT) 0.4 MG SL tablet PLACE 1 TABLET UNDER THE TONGUE EVERY 5 MINUTES AS NEEDED FOR CHEST PAIN. 25 tablet 3  . omega-3 acid ethyl esters (LOVAZA) 1 g capsule Take by mouth 2 (two) times daily.    . pantoprazole (PROTONIX) 40 MG tablet Take 40 mg by mouth 2 (two) times daily.   0  . Pitavastatin Calcium (LIVALO) 1 MG TABS Take 1 tablet (1 mg total) by mouth 2 (two) times a week. Takes on Monday and Friday 72 tablet 3  . timolol (TIMOPTIC) 0.5 % ophthalmic solution Place 1 drop into the left eye daily.     . TURMERIC PO Take 1,000 mg by mouth daily.    . verapamil (CALAN-SR) 240 MG CR tablet Take 1 tablet (240 mg total) by mouth at bedtime. 90 tablet 3  . vitamin B-12  (CYANOCOBALAMIN) 1000 MCG tablet Take 1,000 mcg by mouth 3 (three) times a week.     [name] ibuprofen (ADVIL,MOTRIN) 200 MG tablet Take 400 mg by mouth 2 (two) times daily as needed for headache or moderate pain.    . traMADol (ULTRAM) 50 MG tablet Take 50 mg by mouth every 6 (six) hours as needed.     No facility-administered medications prior to visit.    PAST MEDICAL HISTORY: Past Medical History:  Diagnosis Date  . Allergy   . Arthritis   . BPH (benign prostatic hyperplasia)   . Complication of anesthesia    Pt. stated he had a reaction that ended in him requiring urinary cath placement  . Diverticulosis   . GERD (gastroesophageal reflux disease)    esophageal spasms  . Glaucoma   . Gout   . Head injury, closed, with concussion   . Hepatitis C    chronic - Has been treated with Harvoni  . HLD (hyperlipidemia)    statin intolerant (Crestor & Simvastatin) - Taking Livalo 1mg  / week  . Hypertension   . Parkinson's disease ([place])   . Plantar fasciitis    right  . PVD (peripheral vascular disease) ([place])    With no claudication; only mild abdominal aortic atherosclerosis noted on ultrasound.  . Thoracic ascending aortic aneurysm ([facility])    4.2 cm ascending TAA 09/2016 CT, 1 yr f/u rec  . Ulcer     PAST SURGICAL HISTORY: Past Surgical History:  Procedure Laterality Date  . CARDIAC CATHETERIZATION  2005   30% Cx. Dr. Melvern Banker  . cataract surgery Left 11/05/2014  . COLONOSCOPY    . ESOPHAGOGASTRODUODENOSCOPY N/A 05/02/2015   Procedure: ESOPHAGOGASTRODUODENOSCOPY (EGD);  Surgeon: Ronald Lobo, MD;  Location: [facility];  Service: Endoscopy;  Laterality: N/A;  . ESOPHAGOGASTRODUODENOSCOPY  05/02/2015   no source of pt chest pain endoscopically evident. small hiatal hernia.  [name] EYE SURGERY    . MEMBRANE PEEL Left 03/14/2014  Procedure: MEMBRANE PEEL; ENDOLASER;  Surgeon: Hurman Horn, MD;  Location: [facility];  Service: Ophthalmology;  Laterality: Left;  . NM MYOVIEW LTD  2011   Neg  Ischemia or infarct.  [name] NM MYOVIEW LTD  10/2015   LOW RISK. Small, fixed basal lateral defect - likely diaphragmatic attenuation. EF 69%  . PARS PLANA VITRECTOMY Left 03/14/2014   Procedure: PARS PLANA VITRECTOMY WITH 25 GAUGE;  Surgeon: Hurman Horn, MD;  Location: [facility];  Service: Ophthalmology;  Laterality: Left;  . TONSILLECTOMY    . TRANSTHORACIC ECHOCARDIOGRAM  2013   Normal EF. No significant Valve Disease  . UPPER GASTROINTESTINAL ENDOSCOPY    . WEIL OSTEOTOMY Right 09/01/2017   Procedure: RIGHT GREAT TOE CHEVRON AND WEIL OSTEOTOMY 2ND METATARSAL;  Surgeon: Newt Minion, MD;  Location: [facility];  Service: Orthopedics;  Laterality: Right;    FAMILY HISTORY: Family History  Problem Relation Age of Onset  . Heart disease Mother   . Cancer Paternal Grandfather   . Cancer Sister     SOCIAL HISTORY: Social History   Socioeconomic History  . Marital status: Married    Spouse name: barbra gen  . Number of children: 3  . Years of education: AS  . Highest education level: Not on file  Occupational History  . 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 Smoking status: Former Smoker    Types: Cigars    Quit date: 07/27/1988    Years since quitting: 31.1  . Smokeless tobacco: Never Used  Substance and Sexual Activity  . Alcohol use: Yes    Alcohol/week: 3.0 standard drinks    Types: 1 Glasses of wine, 1 Cans of beer, 1 Shots of liquor per week    Comment: occasional  . Drug use: No  . Sexual activity: Yes  Other Topics Concern  . 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     Wife is retired Therapist, sports from [employer].   He walks roughly 2-3 miles in bed time at least 2-3 times a week. He quit smoking 30 years ago.   Social Determinants of Health   Financial Resource Strain:   . Difficulty of Paying Living Expenses: Not on file  Food Insecurity:   . Worried About Charity fundraiser  in the Last Year: Not on file  . Ran Out of Food in the Last Year: Not on file  Transportation Needs:   . Lack of Transportation (Medical): Not on file  . Lack of Transportation (Non-Medical): Not on file  Physical Activity:   . Days of Exercise per Week: Not on file  . Minutes of Exercise per Session: Not on file  Stress:   . Feeling of Stress : Not on file  Social Connections:   . Frequency of Communication with Friends and Family: Not on file  . Frequency of Social Gatherings with Friends and Family: Not on file  . Attends Religious Services: Not on file  . Active Member of Clubs or Organizations: Not on file  . Attends Archivist Meetings: Not on file  . Marital Status: Not on file  Intimate Partner Violence:   . Fear of Current or Ex-Partner: Not on file  . Emotionally Abused: Not on file  . Physically Abused: Not on file  . Sexually Abused: Not on file     PHYSICAL EXAM  Vitals:  09/04/19 1300  BP: (!) 149/102  Pulse: 63  Temp: (!) 97.5 F (36.4 C)  Weight: 72.6 kg  Height: 5\' 10"  (1.778 m)   Body mass index is 22.96 kg/m.  Generalized: Frail elderly African-American male in no acute distress  Head: normocephalic and atraumatic,.   Neck: Supple,  Musculoskeletal: No deformity   Neurological examination   Mentation: Alert. Clock drawing 4/4, able to name 14 animals which walk on 4 legs.  Normal  Recall 3/3. MMSE - Mini Mental State Exam 08/24/2018 05/11/2014  Orientation to time 5 5  Orientation to Place 5 5  Registration 3 3  Attention/ Calculation 5 5  Recall 2 2  Language- name 2 objects 2 2  Language- repeat 1 1  Language- follow 3 step command 3 3  Language- read & follow direction 1 1  Write a sentence 1 1  Write a sentence-comments no subject -  Copy design 1 1  Total score 29 29  Follows all commands speech and language fluent.  Negative glabellar tap. Cranial nerve II-XII: Pupils were equal round reactive to light extraocular  movements were full, visual field were full on confrontational test. Facial sensation and strength were normal. hearing was intact to finger rubbing bilaterally. Uvula tongue midline. head turning and shoulder shrug were normal and symmetric.Tongue protrusion into cheek strength was normal. Motor: normal bulk and tone, full strength in the BUE, BLE, .  Diminished amplitude of finger tapping on the left with fatigue, intermittent left hand resting tremor, mild cogwheel rigidity upon activation at the left wrist.. Sensory: normal and symmetric to light touch, on the face arms and legs  Coordination: finger-nose-finger, heel-to-shin bilaterally, no dysmetria Reflexes: 1+ upper lower and symmetric plantar responses were flexor bilaterally. Gait and Station: Rising up from seated position without assistance, no festination or stooped posture.  Ambulated well in the hall  moderate stride, decreased  arm swing on the left., smooth turning, no assistive device.  Poor postural response to threat and would fall if not caught him in pushed backwards. DIAGNOSTIC DATA (LABS, IMAGING, TESTING) - I reviewed patient records, labs, notes, testing and imaging myself where available.  Lab Results  Component Value Date   WBC 9.1 04/19/2018   HGB 14.0 04/19/2018   HCT 41.2 04/19/2018   MCV 92.6 04/19/2018   PLT 258 04/19/2018      Component Value Date/Time   NA 137 11/25/2018 0834   K 4.2 11/25/2018 0834   CL 101 11/25/2018 0834   CO2 23 11/25/2018 0834   GLUCOSE 91 11/25/2018 0834   GLUCOSE 113 (H) 04/19/2018 0411   BUN 14 11/25/2018 0834   CREATININE 1.23 11/25/2018 0834   CREATININE 1.06 06/29/2014 0920   CALCIUM 9.4 11/25/2018 0834   PROT 7.2 11/25/2018 0834   ALBUMIN 4.7 11/25/2018 0834   AST 15 11/25/2018 0834   ALT 6 11/25/2018 0834   ALKPHOS 60 11/25/2018 0834   BILITOT 0.4 11/25/2018 0834   GFRNONAA 60 11/25/2018 0834   GFRAA 69 11/25/2018 0834   Lab Results  Component Value Date   CHOL  148 11/25/2018   HDL 49 11/25/2018   LDLCALC 91 11/25/2018   TRIG 39 11/25/2018   CHOLHDL 3.0 11/25/2018     Lab Results  Component Value Date   TSH 1.950 09/28/2016    ASSESSMENT AND PLAN 69 y.o. year old male  has a medical history of 4 year history of resting left arm and leg tremors with mild features of early  left sided Parkinson's disease. Good response to Sinemet but recent worsening of tremors but has been unable to tolerate higher dose due to side effects.. He also complains of mild cognitive impairment which appears stable.    PLAN: I had a long discussion with the patient regarding his parkinsonian tremor which appears slightly worse and he is not able to tolerate a higher dose of Sinemet hence recommend adding Azilect 0.5 mg daily and will increase if tolerated.  I also discussed nonmedication treatment options like deep brain stimulation with the patient but he wants to first discuss this with his wife and try out Azilect if this is not effective he is may be willing to consider referral for the same.  I also advised him to increase participation in cognitively challenging activities like solving crossword puzzles, playing bridge and sodoku for his mild cognitive impairment.  We also discussed memory compensation strategies.  Return for follow-up with me in 3 months or call earlier if necessary. I spent 35 minutes in total face to face time with the patient more than 50% of which was spent counseling and coordination of care, reviewing test results reviewing medications and discussing and reviewing the diagnosis of Parkinson's disease and mild cognitive impairment and importance of exercise and stress relaxation techniques Antony Contras, MD   [facility] [address], [place], [place] [address] [phone]

## 2019-09-04 NOTE — Patient Instructions (Signed)
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  I also discussed nonmedication treatment options like deep brain stimulation with the patient but he wants to first discuss this with his wife and try out Azilect if this is not effective he is may be willing to consider referral for the same.  I also advised him to increase participation in cognitively challenging activities like solving crossword puzzles, playing bridge and sodoku for his mild cognitive impairment.  We also discussed memory compensation strategies.  Return for follow-up with me in 3 months or call earlier if necessary. Memory Compensation Strategies  1. Use "WARM" strategy.  W= write it down  A= associate it  R= repeat it  M= make a mental note  2.   You can keep a Social worker.  Use a 3-ring notebook with sections for the following: calendar, important names and phone numbers,  medications, doctors' names/phone numbers, lists/reminders, and a section to journal what you did  each day.   3.    Use a calendar to write appointments down.  4.    Write yourself a schedule for the day.  This can be placed on the calendar or in a separate section of the Memory Notebook.  Keeping a  regular schedule can help memory.  5.    Use medication organizer with sections for each day or morning/evening pills.  You may need help loading it  6.    Keep a basket, or pegboard by the door.  Place items that you need to take out with you in the basket or on the pegboard.  You may also want to  include a message board for reminders.  7.    Use sticky notes.  Place sticky notes with reminders in a place where the task is performed.  For example: " turn off the  stove" placed by the stove, "lock the door" placed on the door at eye level, " take your medications"  on  the bathroom mirror or by the place where you normally take your medications.  8.    Use alarms/timers.  Use while cooking to remind yourself to check on food or as a reminder to take your medicine, or as a  reminder to make a call, or as a reminder to perform another task, etc. Rasagiline Oral Tablets What is this medicine? RASAGILINE (ra SA ji leen) is a monoamine oxidase inhibitor (MAOI). It is used to treat Parkinson's disease. This medicine may be used for other purposes; ask your health care provider or pharmacist if you have questions. COMMON BRAND NAME(S): Azilect What should I tell my health care provider before I take this medicine? They need to know if you have any of these conditions:  high or low blood pressure  history of stroke  if you drink alcohol  liver disease  mental illness  narcolepsy  sleep apnea  an unusual or allergic reaction to rasagiline, other medicines, foods, dyes, or preservatives  pregnant or trying to get pregnant  breast-feeding How should I use this medicine? Take this medicine by mouth with a glass of water. Follow the directions on the prescription label. Take your doses at regular intervals. Do not take your medicine more often than directed. Do not stop taking except on your doctor's advice. Talk to your pediatrician regarding  the use of this medicine in children. Special care may be needed. Overdosage: If you think you have taken too much of this medicine contact a poison control center or emergency room at once. NOTE: This medicine is only for you. Do not share this medicine with others. What if I miss a dose? If you miss a dose, take it as soon as you can. If it is almost time for your next dose, take only that dose. Do not take double or extra doses. What may interact with this medicine? Do not take this medicine with any of the following medications:  certain medicines for  depression  cyclobenzaprine  dextromethorphan  linezolid  MAOIs like Carbex, Eldepryl, Xadago, Marplan, Nardil, and Parnate  meperidine  methadone  methylene blue  propoxyphene  stimulant medicines for attention disorders, weight loss, or to stay awake  St. John's Wort  tramadol  tryptophan This medicine may also interact with the following medications:  alcohol  amiodarone  antibiotics like ciprofloxacin, norfloxacin  antihistamines for allergy, cough and cold  carbamazepine  certain medicines for sleep  cimetidine  decongestants, including nasal sprays or eye drops  male hormones, like estrogens  furazolidone  isoniazid  medicines for anxiety or psychotic disturbances  medicines for sleep during surgery  mexiletine  narcotic medicines for pain  procarbazine  theophylline  tizanidine  yohimbine  zileuton This list may not describe all possible interactions. Give your health care provider a list of all the medicines, herbs, non-prescription drugs, or dietary supplements you use. Also tell them if you smoke, drink alcohol, or use illegal drugs. Some items may interact with your medicine. What should I watch for while using this medicine? Visit your health care professional for regular checks on your progress. Tell your health care professional if your symptoms do not start to get better or if they get worse. Do not stop taking except on your health care professional's advice. You may develop a severe reaction. Your health care professional will tell you how much medicine to take. You may get drowsy, dizzy, or have blurred vision. Do not drive, use machinery, work in high places, or do anything that needs mental alertness until you know how this medicine affects you. Do not stand or sit up quickly, especially if you are an older patient. This reduces the risk of dizzy or fainting spells. Alcohol may increase dizziness or drowsiness. Talk to your  doctor before drinking alcoholic beverages while taking this medicine. When taking this medicine, you may fall asleep without notice. You may be doing activities like driving a car, talking, or eating. You may not feel drowsy before it happens. Contact your health care provider right away if this happens to you. There have been reports of increased sexual urges or other strong urges such as gambling while taking this medicine. If you experience any of these while taking this medicine, you should report this to your health care provider as soon as possible. Foods that contain very high amounts of tyramine, such as aged, fermented, cured, smoked and pickled foods, should be avoided while taking this medicine. The combination may cause a dangerous rise in blood pressure. Ask your doctor or health care professional, pharmacist, or nutritionist for a complete listing of foods and beverages that are high in tyramine. If you consume a food or beverage very rich in tyramine and do not feel well soon after eating, contact your health care provider. Some medicines may interact with this medicine and could cause adverse  effects. Talk to your doctor if you are taking or planning to take any over-the-counter drugs, especially cough remedies or decongestants, including nasal sprays or eye drops. This medicine may also interact with antidepressants and certain medicines for pain. Contact your health care provider before taking new medications including antidepressants, pain medicines, or prescription or over-the-counter medicines for congestion, cough, colds, or allergies. If you are scheduled for any medical or dental procedure, tell your healthcare provider that you are taking this medicine. This medicine can interact with other medicines used during surgery. What side effects may I notice from receiving this medicine? Side effects that you should report to your doctor or health care professional as soon as  possible:  allergic reactions like skin rash, itching or hives, swelling of the face, lips, or tongue  changes in emotions or moods  changes in vision  falling asleep during normal activities like driving  hallucinations  high blood pressure  new or increased gambling urges, sexual urges, uncontrolled spending, binge or compulsive eating, or other urges  signs and symptoms of hypertensive crisis like severe headache with confusion and blurred vision, severe chest pain, shortness of breath, nausea and vomiting, seizures  signs and symptoms of low blood pressure like dizziness; feeling faint or lightheaded, falls; unusually weak or tired  suicidal thoughts  uncontrollable movements of the arms, face, head, mouth, neck, or upper body Side effects that usually do not require medical attention (report to your doctor or health care professional if they continue or are bothersome):  dry mouth  nausea  trouble sleeping  upset stomach This list may not describe all possible side effects. Call your doctor for medical advice about side effects. You may report side effects to FDA at 1-800-FDA-1088. Where should I keep my medicine? Keep out of the reach of children. Store at room temperature between 15 and 30 degrees C (59 and 86 degrees F). Throw away any unused medicine after the expiration date. NOTE: This sheet is a summary. It may not cover all possible information. If you have questions about this medicine, talk to your doctor, pharmacist, or health care provider.  2020 Elsevier/Gold Standard (2019-03-20 CW:[id number])

## 2019-09-07 MED FILL — CITALOPRAM HBR 20 MG TABLET: 20 | 30 days supply | Qty: 30 | Fill #1

## 2019-09-13 MED FILL — IRBESARTAN 300 MG TABS: 300 | 30 days supply | Qty: 30 | Fill #8

## 2019-09-17 ENCOUNTER — Ambulatory Visit: Payer: 59 | Attending: Internal Medicine

## 2019-09-17 DIAGNOSIS — Z23 Encounter for immunization: Secondary | ICD-10-CM | POA: Insufficient documentation

## 2019-09-17 NOTE — Progress Notes (Signed)
   Covid-[facility] Vaccination [facility]  Name:  [name]    MRN: [id number] DOB: 1951-05-18  09/17/2019  Mr. [name] was observed post Covid-19 immunization for 15 minutes without incidence. He was provided with Vaccine Information Sheet and instruction to access the V-Safe system.   Mr. [name] was instructed to call 911 with any severe reactions post vaccine: [name] Difficulty breathing  . Swelling of your face and throat  . A fast heartbeat  . A bad rash all over your body  . Dizziness and weakness    Immunizations Administered    Name Date Dose VIS Date Route   Moderna COVID-19 Vaccine 09/17/2019 12:20 PM 0.5 mL 06/27/2019 Intramuscular   Manufacturer: Moderna   Lot: [id number]   StoreyVO:[id number]

## 2019-09-19 DIAGNOSIS — M79672 Pain in left foot: Secondary | ICD-10-CM | POA: Diagnosis not present

## 2019-09-19 DIAGNOSIS — M79671 Pain in right foot: Secondary | ICD-10-CM | POA: Diagnosis not present

## 2019-09-19 DIAGNOSIS — T8484XA Pain due to internal orthopedic prosthetic devices, implants and grafts, initial encounter: Secondary | ICD-10-CM | POA: Diagnosis not present

## 2019-09-19 DIAGNOSIS — M7741 Metatarsalgia, right foot: Secondary | ICD-10-CM | POA: Diagnosis not present

## 2019-09-19 DIAGNOSIS — M21611 Bunion of right foot: Secondary | ICD-10-CM | POA: Diagnosis not present

## 2019-09-26 ENCOUNTER — Other Ambulatory Visit: Payer: Self-pay | Admitting: Physician Assistant

## 2019-09-26 DIAGNOSIS — M48062 Spinal stenosis, lumbar region with neurogenic claudication: Secondary | ICD-10-CM

## 2019-09-27 ENCOUNTER — Telehealth: Payer: Self-pay | Admitting: Neurology

## 2019-09-27 NOTE — Telephone Encounter (Signed)
Patient called stating he is going to be having foot surgery soon and would like to know if it is okay for him to do so or if there are any concerns

## 2019-09-27 NOTE — Telephone Encounter (Signed)
I reached out to the pt and he states his foot specialist is recommending a foot procedure and advised the pt to check with neurologist if it was safe for the pt to pursue this procedure.  Pt wanted to know what Dr. [name] thoughts on this? Pt was advised Dr. [name] is currently out off the office this week and is working in the [facility].   Pt was advised I would fwd message and we would be back in touch once message was reviewed by MD.  Pt was agreeable.

## 2019-09-27 NOTE — Telephone Encounter (Signed)
Kindly inform the patient that I see him for tremors and mild Parkinson's disease.  To my knowledge there is no increased risk of worsening of this or complications related to foot surgery.  If patient and his foot doctor feel it is necessary he can undergo the surgery without any specific increased risk for his tremors and Parkinson's.

## 2019-09-28 ENCOUNTER — Other Ambulatory Visit: Payer: Self-pay

## 2019-09-28 ENCOUNTER — Ambulatory Visit
Admission: RE | Admit: 2019-09-28 | Discharge: 2019-09-28 | Disposition: A | Discharge status: Home / Self Care | Payer: 59 | Source: Ambulatory Visit | Attending: Physician Assistant | Admitting: Physician Assistant

## 2019-09-28 DIAGNOSIS — M47817 Spondylosis without myelopathy or radiculopathy, lumbosacral region: Secondary | ICD-10-CM | POA: Diagnosis not present

## 2019-09-28 DIAGNOSIS — M48062 Spinal stenosis, lumbar region with neurogenic claudication: Secondary | ICD-10-CM

## 2019-09-28 MED ORDER — METHYLPREDNISOLONE ACETATE 40 MG/ML INJ SUSP (RADIOLOG
120.0000 mg | Freq: Once | INTRAMUSCULAR | Status: AC
  Administered 2019-09-28: 120 mg via EPIDURAL

## 2019-09-28 MED ORDER — IOPAMIDOL (ISOVUE-M 200) INJECTION 41%
1.0000 mL | Freq: Once | INTRAMUSCULAR | Status: AC
  Administered 2019-09-28: 1 mL via EPIDURAL

## 2019-09-28 NOTE — Discharge Instructions (Signed)

## 2019-09-28 NOTE — Telephone Encounter (Signed)
I reached out to the pt and advised of Dr. [name] response. He verbalized understanding and appreciation for the call.

## 2019-10-02 MED FILL — CITALOPRAM HBR 20 MG TABLET: 20 | 30 days supply | Qty: 30 | Fill #2

## 2019-10-02 MED FILL — TIMOLOL 0.5% EYE DROPS: 0.5 | 90 days supply | Qty: 10 | Fill #1

## 2019-10-02 MED FILL — NITROGLYCERIN 0.4 MG TAB SL: 0.4 | 15 days supply | Qty: 25 | Fill #2

## 2019-10-03 ENCOUNTER — Telehealth: Payer: Self-pay | Admitting: Neurology

## 2019-10-03 NOTE — Telephone Encounter (Signed)
I call pt with Dr. [name] advice. I stated to stop the artane for 3 to 4 days and see if the side effects resolve. If they resolve take the required dosage every other day for two weeks and give [place] a call back. Pt verbalized understanding.Pt stated the medication did help his tremors.

## 2019-10-03 NOTE — Telephone Encounter (Signed)
Pt has called Katrina, RN back, please call

## 2019-10-03 NOTE — Telephone Encounter (Signed)
I called pt about possible having side effects with taking the new medication artane. Pt stated he sweats at night since taking the medication, he feels sluggish and his wife stated he feels disoriented.Message will be sent to Dr. [name] for review and pt verbalized understanding.

## 2019-10-03 NOTE — Telephone Encounter (Signed)
Advised the patient to stop it for a few days and see if he feels better and if this is true he can try taking the medication every other day for couple of weeks and then change to every day if tolerated better

## 2019-10-03 NOTE — Telephone Encounter (Signed)
Pt called wanting to discuss with RN or provider symptoms he has been experiencing on the trihexyphenidyl (ARTANE) 2 MG tablet. Pt states it is causing him to sweat, he is tiered and it is making him disoriented. Please advise.

## 2019-10-03 NOTE — Telephone Encounter (Signed)
Left vm for patient to call back about artane medication having side effects.

## 2019-10-07 MED FILL — IRBESARTAN 300 MG TABS: 300 | 30 days supply | Qty: 30 | Fill #9

## 2019-10-09 ENCOUNTER — Telehealth: Payer: Self-pay | Admitting: Neurology

## 2019-10-09 NOTE — Telephone Encounter (Signed)
I called pt about the dosage of artane per Dr. [name] instructions for last week.   . I stated to stop the artane for 3 to 4 days and see if the side effects resolve. If they resolve take the required dosage every other day for two weeks and give [place] a call back. Pt verbalized understanding.Pt stated the medication did help his tremors.

## 2019-10-09 NOTE — Telephone Encounter (Signed)
Pt called requesting a CB in regards to the artane dosage and how he should be taking it.

## 2019-10-18 ENCOUNTER — Other Ambulatory Visit (HOSPITAL_COMMUNITY): Payer: Self-pay | Admitting: Orthopedic Surgery

## 2019-10-20 ENCOUNTER — Telehealth: Payer: Self-pay | Admitting: Family Medicine

## 2019-10-20 NOTE — Telephone Encounter (Signed)
Pt called and states that he would like to talk to you about his foot, states he is having foot sugery on one of them but he wants to ask you about that and his other foot, pt would like a phone call informed pt that you was out of the office until Monday pt can be reached at [phone]

## 2019-10-24 ENCOUNTER — Other Ambulatory Visit: Payer: Self-pay | Admitting: Neurology

## 2019-10-24 MED FILL — CARBIDOPA-LEVODOPA 25-100 T: 25-100 | 67 days supply | Qty: 405 | Fill #0

## 2019-10-24 MED FILL — LIVALO 1 MG TABLET: 1 | 28 days supply | Qty: 8 | Fill #4

## 2019-10-26 ENCOUNTER — Telehealth: Payer: Self-pay

## 2019-10-26 ENCOUNTER — Other Ambulatory Visit: Payer: Self-pay

## 2019-10-26 ENCOUNTER — Encounter (HOSPITAL_BASED_OUTPATIENT_CLINIC_OR_DEPARTMENT_OTHER): Payer: Self-pay | Admitting: Orthopedic Surgery

## 2019-10-26 NOTE — Telephone Encounter (Signed)
   [facility] Pre-operative Risk Assessment    Request for surgical clearance:  1. What type of surgery is being performed? 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 When is this surgery scheduled? 11/02/19   3. What type of clearance is required (medical clearance vs. Pharmacy clearance to hold med vs. Both)? both  4. Are there any medications that need to be held prior to surgery and how long? N/A  5. Practice name and name of physician performing surgery? [facility]- Dr.John Hewitt   6. What is your office phone number [phone]   7.   What is your office fax number [phone]  8.   Anesthesia type (None, local, MAC, general) ? Regional/choice   [name] 10/26/2019, 6:07 PM  _________________________________________________________________   (provider comments below)

## 2019-10-26 NOTE — Progress Notes (Signed)
Derl Barrow, from Dr. Nona Dell office called back. Received voicemail and states she will be faxing cardiac clearance request to Dr. Ellyn Hack.

## 2019-10-26 NOTE — Progress Notes (Addendum)
Chart reviewed by Dr. Linna Caprice, Anesthesiologist. CT results from 10/08/2016 reviewed, including recommendation for follow up CT scan, which has not been done. Also, patient stated he took nitroglycerin approximately three weeks ago for chest pain. Notes from visit to Dr. Ellyn Hack on 11/14/18 reviewed by Dr. Linna Caprice. Dr. Renelda Loma office notified that cardiac clearance is being requested by Dr. Linna Caprice prior to surgery. Left message for Derl Barrow at Dr. [facility] office that patient needs cardiac clearance.

## 2019-10-27 ENCOUNTER — Ambulatory Visit: Payer: 59 | Admitting: Cardiology

## 2019-10-27 ENCOUNTER — Other Ambulatory Visit: Payer: Self-pay

## 2019-10-27 ENCOUNTER — Encounter: Payer: Self-pay | Admitting: Cardiology

## 2019-10-27 VITALS — BP 137/90 | HR 60 | Ht 70.5 in | Wt 157.8 lb

## 2019-10-27 DIAGNOSIS — Z01818 Encounter for other preprocedural examination: Secondary | ICD-10-CM | POA: Diagnosis not present

## 2019-10-27 DIAGNOSIS — R079 Chest pain, unspecified: Secondary | ICD-10-CM | POA: Diagnosis not present

## 2019-10-27 NOTE — Telephone Encounter (Signed)
   Primary Cardiologist:David Ellyn Hack, MD  Chart reviewed as part of pre-operative protocol coverage. Because of Erma Frierson Swor's past medical history and time since last visit, he/she will require a follow-up visit in order to better assess preoperative cardiovascular risk.  Pre-op covering staff: - Please schedule appointment and call patient to inform them. - Please contact requesting surgeon's office via preferred method (i.e, phone, fax) to inform them of need for appointment prior to surgery.  If applicable, this message will also be routed to pharmacy pool and/or primary cardiologist for input on holding anticoagulant/antiplatelet agent as requested below so that this information is available at time of patient's appointment.   Cecilie Kicks, NP  10/27/2019, 9:46 AM

## 2019-10-27 NOTE — Telephone Encounter (Signed)
Updated surgery date

## 2019-10-27 NOTE — Progress Notes (Signed)
Cardiology Office Note:    Date:  10/27/2019   ID:  Jesus, Maxwell [birth year], MRN [id number]  PCP:  Denita Lung, MD  Cardiologist:  Glenetta Hew, MD  Electrophysiologist:  None   Referring MD: Denita Lung, MD   Chief Complaint  Patient presents with  . Pre-op Exam    History of Present Illness:    [name] is a 69 y.o. male with a hx of hypertension, hyperlipidemia, Parkinson's disease who presents for preop evaluation prior to foot surgery.  Surgery is planned for 4/8.  Patient reports that he has chronic chest pain that occurs after eating certain foods, particularly sweet or spicy foods.  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  States that he has had no change in his symptoms over the last few years.  Denies any exertional chest pain.  States that he walks his dog 2-3 times per day, for 10 to 15 minutes each time.  Denies any exertional symptoms.  Also states that he can walk up 2 flights of stairs without stopping.   Past Medical History:  Diagnosis Date  . Allergy   . Arthritis   . BPH (benign prostatic hyperplasia)   . Complication of anesthesia    Pt. stated he had a reaction that ended in him requiring urinary cath placement  . Diverticulosis   . GERD (gastroesophageal reflux disease)    esophageal spasms  . Glaucoma   . Gout   . Head injury, closed, with concussion   . Hepatitis C    chronic - Has been treated with Harvoni  . HLD (hyperlipidemia)    statin intolerant (Crestor & Simvastatin) - Taking Livalo 1mg  / week  . Hypertension   . Parkinson's disease ([place])   . Plantar fasciitis    right  . PVD (peripheral vascular disease) ([place])    With no claudication; only mild abdominal aortic atherosclerosis noted on ultrasound.  . Thoracic ascending aortic aneurysm (HCC)    4.2 cm ascending TAA 09/2016 CT, 1 yr f/u rec  .  Ulcer     Past Surgical History:  Procedure Laterality Date  . CARDIAC CATHETERIZATION  2005   30% Cx. Dr. Melvern Banker  . cataract surgery Left 11/05/2014  . COLONOSCOPY    . ESOPHAGOGASTRODUODENOSCOPY N/A 05/02/2015   Procedure: ESOPHAGOGASTRODUODENOSCOPY (EGD);  Surgeon: Ronald Lobo, MD;  Location: [facility];  Service: Endoscopy;  Laterality: N/A;  . ESOPHAGOGASTRODUODENOSCOPY  05/02/2015   no source of pt chest pain endoscopically evident. small hiatal hernia.  [name] EYE SURGERY    . MEMBRANE PEEL Left 03/14/2014   Procedure: MEMBRANE PEEL; ENDOLASER;  Surgeon: Hurman Horn, MD;  Location: [facility];  Service: Ophthalmology;  Laterality: Left;  . NM MYOVIEW LTD  2011   Neg Ischemia or infarct.  [name] NM MYOVIEW LTD  10/2015   LOW RISK. Small, fixed basal lateral defect - likely diaphragmatic attenuation. EF 69%  . PARS PLANA VITRECTOMY Left 03/14/2014   Procedure: PARS PLANA VITRECTOMY WITH 25 GAUGE;  Surgeon: Hurman Horn, MD;  Location: [facility];  Service: Ophthalmology;  Laterality: Left;  . TONSILLECTOMY    . TRANSTHORACIC ECHOCARDIOGRAM  2013   Normal EF. No significant Valve Disease  . UPPER GASTROINTESTINAL ENDOSCOPY    . WEIL OSTEOTOMY Right 09/01/2017   Procedure: RIGHT GREAT TOE CHEVRON AND WEIL OSTEOTOMY 2ND METATARSAL;  Surgeon: Sharol Given,  Illene Regulus, MD;  Location: [facility];  Service: Orthopedics;  Laterality: Right;    Current Medications: Current Meds  Medication Sig  . Acetaminophen (ACETAMIN PO) Take 500 mg by mouth every 4 (four) hours as needed (pain).   [name] alfuzosin (UROXATRAL) 10 MG 24 hr tablet Take 10 mg by mouth at bedtime.   [name] allopurinol (ZYLOPRIM) 100 MG tablet TAKE 1 TABLET BY MOUTH TWICE A DAY  . calcium-vitamin D (OSCAL WITH D) 250-125 MG-UNIT tablet Take 1 tablet by mouth daily.  . carbidopa-levodopa (SINEMET IR) 25-100 MG tablet TAKE 2 TABLETS BY MOUTH THREE TIMES DAILY  . citalopram (CELEXA) 20 MG tablet TAKE 1 TABLET (20 MG TOTAL) BY MOUTH DAILY.  [name] diclofenac sodium  (VOLTAREN) 1 % GEL Apply 2 g topically 4 (four) times daily. (Patient taking differently: Apply 2 g topically 4 (four) times daily as needed (pain). )  . irbesartan (AVAPRO) 300 MG tablet Take 1 tablet (300 mg total) by mouth every morning.  . Multiple Vitamin (MULTIVITAMIN WITH MINERALS) TABS tablet Take 1 tablet by mouth 4 (four) times a week.   . nitroGLYCERIN (NITROSTAT) 0.4 MG SL tablet PLACE 1 TABLET UNDER THE TONGUE EVERY 5 MINUTES AS NEEDED FOR CHEST PAIN.  [name] omega-3 acid ethyl esters (LOVAZA) 1 g capsule Take by mouth 2 (two) times daily.  . pantoprazole (PROTONIX) 40 MG tablet Take 40 mg by mouth 2 (two) times daily.   . Pitavastatin Calcium (LIVALO) 1 MG TABS Take 1 tablet (1 mg total) by mouth 2 (two) times a week. Takes on Monday and Friday  . rasagiline (AZILECT) 0.5 MG TABS tablet   . timolol (TIMOPTIC) 0.5 % ophthalmic solution Place 1 drop into the left eye daily.   . trihexyphenidyl (ARTANE) 2 MG tablet Take 0.5 tablets (1 mg total) by mouth 2 (two) times daily with a meal.  . verapamil (CALAN-SR) 240 MG CR tablet Take 1 tablet (240 mg total) by mouth at bedtime.  . vitamin B-12 (CYANOCOBALAMIN) 1000 MCG tablet Take 1,000 mcg by mouth 3 (three) times a week.      Allergies:   Patient has no known allergies.   Social History   Socioeconomic History  . Marital status: Married    Spouse name: barbra gen  . Number of children: 3  . Years of education: AS  . Highest education level: Not on file  Occupational History  . 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 Smoking status: Former Smoker    Types: Cigars    Quit date: 07/27/1988    Years since quitting: 31.2  . Smokeless tobacco: Never Used  Substance and Sexual Activity  . Alcohol use: Yes    Alcohol/week: 3.0 standard drinks    Types: 1 Glasses of wine, 1 Cans of beer, 1 Shots of liquor per week    Comment: occasional  . Drug use: No  . Sexual activity: Yes  Other Topics Concern  . 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     Wife is retired Therapist, sports from [employer]   He walks roughly 2-3 miles in bed time at least 2-3 times a week. He quit smoking 30 years ago.   Social Determinants of Health   Financial Resource Strain:   . Difficulty of Paying Living Expenses:   Food Insecurity:   . Worried About Charity fundraiser in the Last  Year:   . Ran Out of Food in the Last Year:   Transportation Needs:   . Film/video editor (Medical):   [name] Lack of Transportation (Non-Medical):   Physical Activity:   . Days of Exercise per Week:   . Minutes of Exercise per Session:   Stress:   . Feeling of Stress :   Social Connections:   . Frequency of Communication with Friends and Family:   . Frequency of Social Gatherings with Friends and Family:   . Attends Religious Services:   . Active Member of Clubs or Organizations:   . Attends Archivist Meetings:   [name] Marital Status:      Family History: The patient's family history includes Cancer in his paternal grandfather and sister; Heart disease in his mother.  ROS:   Please see the history of present illness.     All other systems reviewed and are negative.  EKGs/Labs/Other Studies Reviewed:    The following studies were reviewed today:   EKG:  EKG is  ordered today.  The ekg ordered today demonstrates normal sinus rhythm, rate 60, Q waves in leads III, aVF, V1-3.  Prior EKG on 09/27/2017 shows Q waves in leads III, aVF, V1-2  Recent Labs: 11/25/2018: ALT 6; BUN 14; Creatinine, Ser 1.23; Potassium 4.2; Sodium 137  Recent Lipid Panel    Component Value Date/Time   CHOL 148 11/25/2018 0834   TRIG 39 11/25/2018 0834   HDL 49 11/25/2018 0834   CHOLHDL 3.0 11/25/2018 0834   CHOLHDL 3.0 07/02/2015 0846   VLDL 7 07/02/2015 0846   LDLCALC 91 11/25/2018 0834    Physical Exam:    VS:  BP 137/90   Pulse 60   Ht 5' 10.5" (1.791 m)   Wt 157 lb  12.8 oz (71.6 kg)   SpO2 98%   BMI 22.32 kg/m     Wt Readings from Last 3 Encounters:  10/27/19 157 lb 12.8 oz (71.6 kg)  09/04/19 160 lb (72.6 kg)  02/23/19 163 lb (73.9 kg)     [id number] nourished, well developed in no acute distress HEENT: Normal NECK: No JVD; No carotid bruits LYMPHATICS: No lymphadenopathy CARDIAC: RRR, no murmurs, rubs, gallops RESPIRATORY:  Clear to auscultation without rales, wheezing or rhonchi  ABDOMEN: Soft, non-tender, non-distended MUSCULOSKELETAL:  No edema; No deformity  SKIN: Warm and dry NEUROLOGIC:  Alert and oriented x 3 PSYCHIATRIC:  Normal affect   ASSESSMENT:    1. Pre-op evaluation   2. Chest pain of uncertain etiology    PLAN:     Preop evaluation: Prior to foot surgery.  Reports chronic chest pain suspected to be gastrointestinal as occurs after eating certain foods (he has had prior work-up for this, most recently [place] in 2017 that was unremarkable).  Denies any exertional chest pain or dyspnea.  Functional capacity is greater than 4 METS.  RCRI score 0, which corresponds to 4% 30-day risk of death, MI, or cardiac arrest.  Overall would classify as low risk of major adverse cardiac event, and no further cardiac work-up is recommended prior to surgery  Follow-up in 3 months with Dr. Ellyn Hack  Medication Adjustments/Labs and Tests Ordered: Current medicines are reviewed at length with the patient today.  Concerns regarding medicines are outlined above.  No orders of the defined types were placed in this encounter.  No orders of the defined types were placed in this encounter.   Patient Instructions  Medication Instructions:  NO CHANGES *If you  need a refill on your cardiac medications before your next appointment, please call your pharmacy*   Lab Work: NOT NEEDED   Testing/Procedures: NOT NEEDED   Follow-Up: At [facility], you and your health needs are our priority.  As part of our continuing mission to  provide you with exceptional heart care, we have created designated Provider Care Teams.  These Care Teams include your primary Cardiologist (physician) and Advanced Practice Providers (APPs -  Physician Assistants and Nurse Practitioners) who all work together to provide you with the care you need, when you need it.    Your next appointment:   3 month(s)  The format for your next appointment:   In Person  Provider:   Glenetta Hew, MD   Other Instructions  CLEARANCE FOR SURGERY    Signed, Donato Heinz, MD  10/27/2019 9:27 PM    [place]

## 2019-10-27 NOTE — Telephone Encounter (Signed)
I s/w the pt and informed him he would need a pre op appt for clearance as we have not seen him in 1 yr. Pt is agreeable. I have pt coming in today to see Dr. Gilman Schmidt, ok per Dr. Gilman Schmidt to use 3:20 DOD time slot. Pt thanked me for the call and the help. I will send clearance no te to Dr. Gilman Schmidt for today's appt. I will send FYI to Dr. Doran Durand. I will remove from the pre op call back pool.

## 2019-10-27 NOTE — Patient Instructions (Addendum)
Medication Instructions:  NO CHANGES *If you need a refill on your cardiac medications before your next appointment, please call your pharmacy*   Lab Work: NOT NEEDED   Testing/Procedures: NOT NEEDED   Follow-Up: At [facility], you and your health needs are our priority.  As part of our continuing mission to provide you with exceptional heart care, we have created designated Provider Care Teams.  These Care Teams include your primary Cardiologist (physician) and Advanced Practice Providers (APPs -  Physician Assistants and Nurse Practitioners) who all work together to provide you with the care you need, when you need it.    Your next appointment:   3 month(s)  The format for your next appointment:   In Person  Provider:   Glenetta Hew, MD   Other Instructions  CLEARANCE FOR SURGERY

## 2019-10-29 MED FILL — CITALOPRAM HBR 20 MG TABLET: 20 | 30 days supply | Qty: 30 | Fill #3

## 2019-10-30 ENCOUNTER — Other Ambulatory Visit: Payer: Self-pay | Admitting: Cardiology

## 2019-10-30 ENCOUNTER — Other Ambulatory Visit (HOSPITAL_COMMUNITY)
Admission: RE | Admit: 2019-10-30 | Discharge: 2019-10-30 | Disposition: A | Discharge status: Home / Self Care | Payer: 59 | Source: Ambulatory Visit | Attending: Orthopedic Surgery | Admitting: Orthopedic Surgery

## 2019-10-30 DIAGNOSIS — Z20822 Contact with and (suspected) exposure to covid-19: Secondary | ICD-10-CM | POA: Diagnosis not present

## 2019-10-30 DIAGNOSIS — Z01812 Encounter for preprocedural laboratory examination: Secondary | ICD-10-CM | POA: Diagnosis not present

## 2019-10-30 LAB — SARS CORONAVIRUS 2 (TAT 6-24 HRS): SARS Coronavirus 2: NEGATIVE

## 2019-10-30 MED ORDER — CHLORHEXIDINE GLUCONATE 4 % EX LIQD: 60.0000 mL | Freq: Once | CUTANEOUS | Status: DC

## 2019-10-30 MED FILL — VERAPAMIL HCL ER 240 MG TBC: 240 | 90 days supply | Qty: 90 | Fill #2

## 2019-10-30 MED FILL — TRIHEXYPHENIDYL HCL 2 MG TA: 2 | 30 days supply | Qty: 30 | Fill #1

## 2019-10-30 NOTE — Progress Notes (Signed)

## 2019-10-31 MED FILL — IRBESARTAN 300 MG TABS: 300 | 30 days supply | Qty: 30 | Fill #0

## 2019-10-31 NOTE — Telephone Encounter (Signed)
Rx request sent to pharmacy.  

## 2019-11-01 ENCOUNTER — Other Ambulatory Visit (INDEPENDENT_AMBULATORY_CARE_PROVIDER_SITE_OTHER): Payer: 59

## 2019-11-01 DIAGNOSIS — I10 Essential (primary) hypertension: Secondary | ICD-10-CM

## 2019-11-02 ENCOUNTER — Ambulatory Visit (HOSPITAL_BASED_OUTPATIENT_CLINIC_OR_DEPARTMENT_OTHER)
Admission: RE | Admit: 2019-11-02 | Discharge: 2019-11-02 | Disposition: A | Discharge status: Home / Self Care | Payer: 59 | Attending: Orthopedic Surgery | Admitting: Orthopedic Surgery

## 2019-11-02 ENCOUNTER — Encounter (HOSPITAL_BASED_OUTPATIENT_CLINIC_OR_DEPARTMENT_OTHER): Payer: Self-pay | Admitting: Orthopedic Surgery

## 2019-11-02 ENCOUNTER — Ambulatory Visit (HOSPITAL_BASED_OUTPATIENT_CLINIC_OR_DEPARTMENT_OTHER): Payer: 59 | Admitting: Certified Registered"

## 2019-11-02 ENCOUNTER — Encounter (HOSPITAL_BASED_OUTPATIENT_CLINIC_OR_DEPARTMENT_OTHER)
Admission: RE | Disposition: A | Discharge status: Home / Self Care | Payer: Self-pay | Source: Home / Self Care | Attending: Orthopedic Surgery

## 2019-11-02 ENCOUNTER — Other Ambulatory Visit: Payer: Self-pay

## 2019-11-02 DIAGNOSIS — M7741 Metatarsalgia, right foot: Secondary | ICD-10-CM | POA: Diagnosis not present

## 2019-11-02 DIAGNOSIS — I739 Peripheral vascular disease, unspecified: Secondary | ICD-10-CM | POA: Diagnosis not present

## 2019-11-02 DIAGNOSIS — F0280 Dementia in other diseases classified elsewhere without behavioral disturbance: Secondary | ICD-10-CM | POA: Diagnosis not present

## 2019-11-02 DIAGNOSIS — I2510 Atherosclerotic heart disease of native coronary artery without angina pectoris: Secondary | ICD-10-CM | POA: Insufficient documentation

## 2019-11-02 DIAGNOSIS — Z79899 Other long term (current) drug therapy: Secondary | ICD-10-CM | POA: Insufficient documentation

## 2019-11-02 DIAGNOSIS — I712 Thoracic aortic aneurysm, without rupture: Secondary | ICD-10-CM | POA: Diagnosis not present

## 2019-11-02 DIAGNOSIS — M19071 Primary osteoarthritis, right ankle and foot: Secondary | ICD-10-CM | POA: Diagnosis not present

## 2019-11-02 DIAGNOSIS — K219 Gastro-esophageal reflux disease without esophagitis: Secondary | ICD-10-CM | POA: Insufficient documentation

## 2019-11-02 DIAGNOSIS — F329 Major depressive disorder, single episode, unspecified: Secondary | ICD-10-CM | POA: Insufficient documentation

## 2019-11-02 DIAGNOSIS — M21611 Bunion of right foot: Secondary | ICD-10-CM | POA: Insufficient documentation

## 2019-11-02 DIAGNOSIS — M2041 Other hammer toe(s) (acquired), right foot: Secondary | ICD-10-CM | POA: Diagnosis not present

## 2019-11-02 DIAGNOSIS — Z87891 Personal history of nicotine dependence: Secondary | ICD-10-CM | POA: Insufficient documentation

## 2019-11-02 DIAGNOSIS — E785 Hyperlipidemia, unspecified: Secondary | ICD-10-CM | POA: Insufficient documentation

## 2019-11-02 DIAGNOSIS — I10 Essential (primary) hypertension: Secondary | ICD-10-CM | POA: Insufficient documentation

## 2019-11-02 DIAGNOSIS — Y831 Surgical operation with implant of artificial internal device as the cause of abnormal reaction of the patient, or of later complication, without mention of misadventure at the time of the procedure: Secondary | ICD-10-CM | POA: Diagnosis not present

## 2019-11-02 DIAGNOSIS — T8484XA Pain due to internal orthopedic prosthetic devices, implants and grafts, initial encounter: Secondary | ICD-10-CM | POA: Diagnosis not present

## 2019-11-02 DIAGNOSIS — G20 Parkinson's disease: Secondary | ICD-10-CM | POA: Insufficient documentation

## 2019-11-02 DIAGNOSIS — G8918 Other acute postprocedural pain: Secondary | ICD-10-CM | POA: Diagnosis not present

## 2019-11-02 HISTORY — PX: HARDWARE REMOVAL: SHX979

## 2019-11-02 HISTORY — PX: BUNIONECTOMY WITH WEIL OSTEOTOMY: SHX5604

## 2019-11-02 SURGERY — BUNIONECTOMY WITH WEIL OSTEOTOMY
Anesthesia: Regional | Site: Foot | Laterality: Right

## 2019-11-02 MED ORDER — OXYCODONE HCL 5 MG PO TABS: 5.0000 mg | ORAL_TABLET | ORAL | 0 refills | Status: AC | PRN

## 2019-11-02 MED ORDER — SODIUM CHLORIDE 0.9 % IV SOLN: INTRAVENOUS | Status: DC

## 2019-11-02 MED ORDER — HYDRALAZINE HCL 20 MG/ML IJ SOLN
INTRAMUSCULAR | Status: AC
  Filled 2019-11-02: qty 1

## 2019-11-02 MED ORDER — ACETAMINOPHEN 500 MG PO TABS
1000.0000 mg | ORAL_TABLET | Freq: Once | ORAL | Status: AC
  Administered 2019-11-02: 1000 mg via ORAL

## 2019-11-02 MED ORDER — LIDOCAINE 2% (20 MG/ML) 5 ML SYRINGE
INTRAMUSCULAR | Status: DC | PRN
  Administered 2019-11-02: 20 mg via INTRAVENOUS

## 2019-11-02 MED ORDER — VANCOMYCIN HCL 500 MG IV SOLR
INTRAVENOUS | Status: DC | PRN
  Administered 2019-11-02: 500 mg via TOPICAL

## 2019-11-02 MED ORDER — PROPOFOL 500 MG/50ML IV EMUL
INTRAVENOUS | Status: DC | PRN
  Administered 2019-11-02: 25 ug/kg/min via INTRAVENOUS

## 2019-11-02 MED ORDER — MIDAZOLAM HCL 2 MG/2ML IJ SOLN
2.0000 mg | Freq: Once | INTRAMUSCULAR | Status: AC
  Administered 2019-11-02: 1 mg via INTRAVENOUS

## 2019-11-02 MED ORDER — LACTATED RINGERS IV SOLN: INTRAVENOUS | Status: DC

## 2019-11-02 MED ORDER — SENNA 8.6 MG PO TABS: 2.0000 | ORAL_TABLET | Freq: Two times a day (BID) | ORAL | 0 refills | Status: DC

## 2019-11-02 MED ORDER — FENTANYL CITRATE (PF) 100 MCG/2ML IJ SOLN: 25.0000 ug | INTRAMUSCULAR | Status: DC | PRN

## 2019-11-02 MED ORDER — OXYCODONE HCL 5 MG/5ML PO SOLN: 5.0000 mg | Freq: Once | ORAL | Status: DC | PRN

## 2019-11-02 MED ORDER — HYDRALAZINE HCL 20 MG/ML IJ SOLN
5.0000 mg | Freq: Once | INTRAMUSCULAR | Status: AC
  Administered 2019-11-02: 5 mg via INTRAVENOUS

## 2019-11-02 MED ORDER — MIDAZOLAM HCL 2 MG/2ML IJ SOLN
INTRAMUSCULAR | Status: AC
  Filled 2019-11-02: qty 2

## 2019-11-02 MED ORDER — 0.9 % SODIUM CHLORIDE (POUR BTL) OPTIME
TOPICAL | Status: DC | PRN
  Administered 2019-11-02: 150 mL

## 2019-11-02 MED ORDER — DOCUSATE SODIUM 100 MG PO CAPS: 100.0000 mg | ORAL_CAPSULE | Freq: Every day | ORAL | 2 refills | Status: AC | PRN

## 2019-11-02 MED ORDER — FENTANYL CITRATE (PF) 100 MCG/2ML IJ SOLN
INTRAMUSCULAR | Status: AC
  Filled 2019-11-02: qty 2

## 2019-11-02 MED ORDER — CEFAZOLIN SODIUM-DEXTROSE 2-4 GM/100ML-% IV SOLN
2.0000 g | INTRAVENOUS | Status: AC
  Administered 2019-11-02: 2 g via INTRAVENOUS

## 2019-11-02 MED ORDER — ROPIVACAINE HCL 5 MG/ML IJ SOLN
INTRAMUSCULAR | Status: DC | PRN
  Administered 2019-11-02: 40 mL via PERINEURAL

## 2019-11-02 MED ORDER — ONDANSETRON HCL 4 MG/2ML IJ SOLN
INTRAMUSCULAR | Status: DC | PRN
  Administered 2019-11-02: 4 mg via INTRAVENOUS

## 2019-11-02 MED ORDER — ACETAMINOPHEN 500 MG PO TABS
ORAL_TABLET | ORAL | Status: AC
  Filled 2019-11-02: qty 2

## 2019-11-02 MED ORDER — OXYCODONE HCL 5 MG PO TABS: 5.0000 mg | ORAL_TABLET | Freq: Once | ORAL | Status: DC | PRN

## 2019-11-02 MED ORDER — DEXAMETHASONE SODIUM PHOSPHATE 10 MG/ML IJ SOLN
INTRAMUSCULAR | Status: DC | PRN
  Administered 2019-11-02: 10 mg

## 2019-11-02 MED ORDER — ONDANSETRON HCL 4 MG/2ML IJ SOLN: 4.0000 mg | Freq: Once | INTRAMUSCULAR | Status: DC | PRN

## 2019-11-02 MED ORDER — FENTANYL CITRATE (PF) 100 MCG/2ML IJ SOLN
50.0000 ug | Freq: Once | INTRAMUSCULAR | Status: AC
  Administered 2019-11-02: 50 ug via INTRAVENOUS

## 2019-11-02 MED ORDER — EPHEDRINE SULFATE 50 MG/ML IJ SOLN
INTRAMUSCULAR | Status: DC | PRN
  Administered 2019-11-02 (×3): 10 mg via INTRAVENOUS

## 2019-11-02 MED ORDER — PROPOFOL 10 MG/ML IV BOLUS
INTRAVENOUS | Status: DC | PRN
  Administered 2019-11-02: 150 mg via INTRAVENOUS

## 2019-11-02 MED ORDER — CEFAZOLIN SODIUM-DEXTROSE 2-4 GM/100ML-% IV SOLN
INTRAVENOUS | Status: AC
  Filled 2019-11-02: qty 100

## 2019-11-02 MED ORDER — ASPIRIN EC 81 MG PO TBEC: 81.0000 mg | DELAYED_RELEASE_TABLET | Freq: Two times a day (BID) | ORAL | 0 refills | Status: DC

## 2019-11-02 MED FILL — ASPIRIN 81 MG TBEC: 81 | 42 days supply | Qty: 84 | Fill #0

## 2019-11-02 MED FILL — oxyCODONE HCL 5 MG TABS: 5 | 5 days supply | Qty: 30 | Fill #0

## 2019-11-02 SURGICAL SUPPLY — 84 items
BANDAGE ESMARK 6X9 LF (GAUZE/BANDAGES/DRESSINGS) IMPLANT
BENZOIN TINCTURE PRP APPL 2/3 (GAUZE/BANDAGES/DRESSINGS) IMPLANT
BIT DRILL 2.4 AO COUPLING CANN (BIT) ×3 IMPLANT
BIT DRILL CANN 2.4 (BIT) ×3
BIT DRILL CANN MAX VPC 2.4 (BIT) ×2 IMPLANT
BLADE AVERAGE 25X9 (BLADE) ×3 IMPLANT
BLADE LONG MED 25X9 (BLADE) IMPLANT
BLADE MICRO SAGITTAL (BLADE) ×3 IMPLANT
BLADE OSC/SAG .038X5.5 CUT EDG (BLADE) IMPLANT
BLADE SURG 15 STRL LF DISP TIS (BLADE) ×6 IMPLANT
BLADE SURG 15 STRL SS (BLADE) ×9
BNDG COHESIVE 4X5 TAN STRL (GAUZE/BANDAGES/DRESSINGS) ×3 IMPLANT
BNDG COHESIVE 6X5 TAN STRL LF (GAUZE/BANDAGES/DRESSINGS) ×3 IMPLANT
BNDG CONFORM 2 STRL LF (GAUZE/BANDAGES/DRESSINGS) IMPLANT
BNDG CONFORM 3 STRL LF (GAUZE/BANDAGES/DRESSINGS) ×3 IMPLANT
BNDG ELASTIC 4X5.8 VLCR STR LF (GAUZE/BANDAGES/DRESSINGS) IMPLANT
BNDG ESMARK 6X9 LF (GAUZE/BANDAGES/DRESSINGS)
CHLORAPREP W/TINT 26 (MISCELLANEOUS) ×3 IMPLANT
COVER BACK TABLE 60X90IN (DRAPES) ×3 IMPLANT
COVER WAND RF STERILE (DRAPES) IMPLANT
CUFF TOURN SGL QUICK 34 (TOURNIQUET CUFF) ×3
CUFF TRNQT CYL 34X4.125X (TOURNIQUET CUFF) ×2 IMPLANT
DECANTER SPIKE VIAL GLASS SM (MISCELLANEOUS) IMPLANT
DRAPE EXTREMITY T 121X128X90 (DISPOSABLE) ×3 IMPLANT
DRAPE OEC MINIVIEW 54X84 (DRAPES) ×3 IMPLANT
DRAPE SURG 17X23 STRL (DRAPES) IMPLANT
DRAPE U-SHAPE 47X51 STRL (DRAPES) ×3 IMPLANT
DRSG MEPITEL 4X7.2 (GAUZE/BANDAGES/DRESSINGS) ×3 IMPLANT
DRSG PAD ABDOMINAL 8X10 ST (GAUZE/BANDAGES/DRESSINGS) ×3 IMPLANT
ELECT REM PT RETURN 9FT ADLT (ELECTROSURGICAL) ×3
ELECTRODE REM PT RTRN 9FT ADLT (ELECTROSURGICAL) ×2 IMPLANT
GAUZE SPONGE 4X4 12PLY STRL (GAUZE/BANDAGES/DRESSINGS) ×3 IMPLANT
GLOVE BIO SURGEON STRL SZ 6.5 (GLOVE) ×3 IMPLANT
GLOVE BIO SURGEON STRL SZ8 (GLOVE) ×3 IMPLANT
GLOVE BIOGEL PI IND STRL 6.5 (GLOVE) ×4 IMPLANT
GLOVE BIOGEL PI IND STRL 7.0 (GLOVE) ×4 IMPLANT
GLOVE BIOGEL PI IND STRL 8 (GLOVE) ×4 IMPLANT
GLOVE BIOGEL PI INDICATOR 6.5 (GLOVE) ×2
GLOVE BIOGEL PI INDICATOR 7.0 (GLOVE) ×2
GLOVE BIOGEL PI INDICATOR 8 (GLOVE) ×2
GLOVE ECLIPSE 8.0 STRL XLNG CF (GLOVE) ×3 IMPLANT
GLOVE SURG SS PI 6.5 STRL IVOR (GLOVE) ×3 IMPLANT
GOWN STRL REUS W/ TWL LRG LVL3 (GOWN DISPOSABLE) ×4 IMPLANT
GOWN STRL REUS W/ TWL XL LVL3 (GOWN DISPOSABLE) ×4 IMPLANT
GOWN STRL REUS W/TWL LRG LVL3 (GOWN DISPOSABLE) ×6
GOWN STRL REUS W/TWL XL LVL3 (GOWN DISPOSABLE) ×6
K-WIRE .054X4 (WIRE) IMPLANT
K-WIRE COCR 1.1X105 (WIRE) ×3
K-WIRE TROC 1.25X150 (WIRE) ×3
KWIRE COCR 1.1X105 (WIRE) ×2 IMPLANT
KWIRE TROC 1.25X150 (WIRE) ×2 IMPLANT
NEEDLE HYPO 22GX1.5 SAFETY (NEEDLE) IMPLANT
NEEDLE HYPO 25X1 1.5 SAFETY (NEEDLE) IMPLANT
NS IRRIG 1000ML POUR BTL (IV SOLUTION) ×3 IMPLANT
PACK BASIN DAY SURGERY FS (CUSTOM PROCEDURE TRAY) ×3 IMPLANT
PADDING CAST COTTON 6X4 STRL (CAST SUPPLIES) IMPLANT
PENCIL SMOKE EVACUATOR (MISCELLANEOUS) ×3 IMPLANT
SANITIZER HAND PURELL 535ML FO (MISCELLANEOUS) ×3 IMPLANT
SCREW CANNULATED PT 4.0X40 (Screw) ×3 IMPLANT
SCREW CORTICAL 3.5X46MM (Screw) ×3 IMPLANT
SCREW HCS TWIST-OFF 2.0X12MM (Screw) ×3 IMPLANT
SCREW VPC 3.4X34MM (Screw) ×3 IMPLANT
SHEET MEDIUM DRAPE 40X70 STRL (DRAPES) ×3 IMPLANT
SLEEVE SCD COMPRESS KNEE MED (MISCELLANEOUS) ×3 IMPLANT
SPLINT FAST PLASTER 5X30 (CAST SUPPLIES) ×20
SPLINT PLASTER CAST FAST 5X30 (CAST SUPPLIES) ×40 IMPLANT
SPONGE LAP 18X18 RF (DISPOSABLE) ×3 IMPLANT
STOCKINETTE 6  STRL (DRAPES) ×3
STOCKINETTE 6 STRL (DRAPES) ×2 IMPLANT
STRIP CLOSURE SKIN 1/2X4 (GAUZE/BANDAGES/DRESSINGS) IMPLANT
SUCTION FRAZIER HANDLE 10FR (MISCELLANEOUS) ×3
SUCTION TUBE FRAZIER 10FR DISP (MISCELLANEOUS) ×2 IMPLANT
SUT ETHILON 3 0 PS 1 (SUTURE) ×3 IMPLANT
SUT MNCRL AB 3-0 PS2 18 (SUTURE) ×3 IMPLANT
SUT VIC AB 0 SH 27 (SUTURE) IMPLANT
SUT VIC AB 2-0 SH 18 (SUTURE) IMPLANT
SUT VIC AB 2-0 SH 27 (SUTURE) ×3
SUT VIC AB 2-0 SH 27XBRD (SUTURE) ×2 IMPLANT
SUT VICRYL 0 UR6 27IN ABS (SUTURE) ×3 IMPLANT
SYR BULB 3OZ (MISCELLANEOUS) ×3 IMPLANT
SYR CONTROL 10ML LL (SYRINGE) IMPLANT
TOWEL GREEN STERILE FF (TOWEL DISPOSABLE) ×6 IMPLANT
TUBE CONNECTING 20X1/4 (TUBING) ×3 IMPLANT
UNDERPAD 30X36 HEAVY ABSORB (UNDERPADS AND DIAPERS) ×3 IMPLANT

## 2019-11-02 NOTE — H&P (Signed)
[name] is an 69 y.o. male.   Chief Complaint: Right foot pain HPI: Mr. [name] is a 69 year old male with past medical history significant for Parkinson's disease.  He has a long history of right forefoot pain from a recurrent bunion deformity as well as metatarsalgia and a second hammertoe.  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  He has failed nonoperative treatment to date including activity modification, oral anti-inflammatories and shoewear modification.  He presents now for operative treatment of this painful forefoot deformity.  Past Medical History:  Diagnosis Date  . Allergy   . Arthritis   . BPH (benign prostatic hyperplasia)   . Complication of anesthesia    Pt. stated he had a reaction that ended in him requiring urinary cath placement  . Diverticulosis   . GERD (gastroesophageal reflux disease)    esophageal spasms  . Glaucoma   . Gout   . Head injury, closed, with concussion   . Hepatitis C    chronic - Has been treated with Harvoni  . HLD (hyperlipidemia)    statin intolerant (Crestor & Simvastatin) - Taking Livalo 1mg  / week  . Hypertension   . Parkinson's disease ([place])   . Plantar fasciitis    right  . PVD (peripheral vascular disease) ([place])    With no claudication; only mild abdominal aortic atherosclerosis noted on ultrasound.  . Thoracic ascending aortic aneurysm (HCC)    4.2 cm ascending TAA 09/2016 CT, 1 yr f/u rec  . Ulcer     Past Surgical History:  Procedure Laterality Date  . CARDIAC CATHETERIZATION  2005   30% Cx. Dr. Melvern Banker  . cataract surgery Left 11/05/2014  . COLONOSCOPY    . ESOPHAGOGASTRODUODENOSCOPY N/A 05/02/2015   Procedure: ESOPHAGOGASTRODUODENOSCOPY (EGD);  Surgeon: Ronald Lobo, MD;  Location: [facility];  Service: Endoscopy;  Laterality: N/A;  . ESOPHAGOGASTRODUODENOSCOPY  05/02/2015   no source of pt chest pain endoscopically evident. small hiatal hernia.  [name] EYE SURGERY    . MEMBRANE PEEL Left 03/14/2014   Procedure: MEMBRANE PEEL; ENDOLASER;  Surgeon: Hurman Horn, MD;  Location: [facility];  Service: Ophthalmology;  Laterality: Left;  . NM MYOVIEW LTD  2011   Neg Ischemia or infarct.  [name] NM MYOVIEW LTD  10/2015   LOW RISK. Small, fixed basal lateral defect - likely diaphragmatic attenuation. EF 69%  . PARS PLANA VITRECTOMY Left 03/14/2014   Procedure: PARS PLANA VITRECTOMY WITH 25 GAUGE;  Surgeon: Hurman Horn, MD;  Location: [facility];  Service: Ophthalmology;  Laterality: Left;  . TONSILLECTOMY    . TRANSTHORACIC ECHOCARDIOGRAM  2013   Normal EF. No significant Valve Disease  . UPPER GASTROINTESTINAL ENDOSCOPY    . WEIL OSTEOTOMY Right 09/01/2017   Procedure: RIGHT GREAT TOE CHEVRON AND WEIL OSTEOTOMY 2ND METATARSAL;  Surgeon: Newt Minion, MD;  Location: [facility];  Service: Orthopedics;  Laterality: Right;    Family History  Problem Relation Age of Onset  . Heart disease Mother   . Cancer Paternal Grandfather   . Cancer Sister    Social History:  reports that he quit smoking about 31 years ago. His smoking use included cigars. He has never used smokeless tobacco. He reports current alcohol use of about 3.0 standard drinks of alcohol per week. He reports that he does not use drugs.  Allergies: No Known Allergies  Medications Prior to Admission  Medication Sig Dispense Refill  . Acetaminophen (ACETAMIN PO) Take 500 mg by mouth  every 4 (four) hours as needed (pain).     [name] alfuzosin (UROXATRAL) 10 MG 24 hr tablet Take 10 mg by mouth at bedtime.     . calcium-vitamin D (OSCAL WITH D) 250-125 MG-UNIT tablet Take 1 tablet by mouth daily.    . carbidopa-levodopa (SINEMET IR) 25-100 MG tablet TAKE 2 TABLETS BY MOUTH THREE TIMES DAILY 405 tablet 1  . citalopram (CELEXA) 20 MG tablet TAKE 1 TABLET (20 MG TOTAL) BY MOUTH DAILY. 30 tablet 3  . diclofenac sodium (VOLTAREN) 1 % GEL Apply 2 g topically 4 (four) times daily. (Patient taking differently: Apply 2 g topically 4 (four) times daily as needed  (pain). ) 100 g 0  . irbesartan (AVAPRO) 300 MG tablet TAKE 1 TABLET (300 MG TOTAL) BY MOUTH EVERY MORNING. 30 tablet 3  . Multiple Vitamin (MULTIVITAMIN WITH MINERALS) TABS tablet Take 1 tablet by mouth 4 (four) times a week.     . nitroGLYCERIN (NITROSTAT) 0.4 MG SL tablet PLACE 1 TABLET UNDER THE TONGUE EVERY 5 MINUTES AS NEEDED FOR CHEST PAIN. 25 tablet 3  . omega-3 acid ethyl esters (LOVAZA) 1 g capsule Take by mouth 2 (two) times daily.    . pantoprazole (PROTONIX) 40 MG tablet Take 40 mg by mouth 2 (two) times daily.   0  . Pitavastatin Calcium (LIVALO) 1 MG TABS Take 1 tablet (1 mg total) by mouth 2 (two) times a week. Takes on Monday and Friday 72 tablet 3  . rasagiline (AZILECT) 0.5 MG TABS tablet     . timolol (TIMOPTIC) 0.5 % ophthalmic solution Place 1 drop into the left eye daily.     . trihexyphenidyl (ARTANE) 2 MG tablet Take 0.5 tablets (1 mg total) by mouth 2 (two) times daily with a meal. 30 tablet 2  . verapamil (CALAN-SR) 240 MG CR tablet Take 1 tablet (240 mg total) by mouth at bedtime. 90 tablet 3  . vitamin B-12 (CYANOCOBALAMIN) 1000 MCG tablet Take 1,000 mcg by mouth 3 (three) times a week.       No results found for this or any previous visit (from the past 48 hour(s)). No results found.  Review of Systems no recent fever, chills, nausea, vomiting or changes in his appetite  Blood pressure 125/88, pulse (!) 52, temperature (!) 97 F (36.1 C), temperature source Tympanic, resp. rate 11, height 5' 10.5" (1.791 m), weight 69.9 kg, SpO2 96 %. Physical Exam  Well-nourished well-developed man in no apparent distress.  Alert and oriented x4.  Normal mood and affect.  Extraocular motions are intact.  Respirations are unlabored.  Gait is heel-to-toe reciprocal bilaterally without significant antalgia.  There is a moderate bunion deformity noted with crossover of the second toe on top of the hallux.  Surgical incisions of healed nicely.  No signs of infection.  Decreased range  of motion of the second MTP joint with pain.  Tender to palpation of the medial eminence.   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 -To the operating room today for Lapidus and modified McBride bunionectomy.  Hardware removal at the second MTP joint, Weil osteotomy versus joint resurfacing.  The risks and benefits of the alternative treatment options have been discussed in detail.  The patient wishes to proceed with surgery and specifically understands risks of bleeding, infection, nerve damage, blood clots, need for additional surgery, amputation and death.   Wylene Simmer, MD 11/23/2019, 10:26 AM

## 2019-11-02 NOTE — Transfer of Care (Signed)
Immediate Anesthesia Transfer of Care Note  Patient: [name]  Procedure(s) Performed: Right Foot Lapidus, Modified McBride Bunionectomy,  Weil Osteotomy (Right Foot) Second Metatarsal Removal of Deep Implant (Right Foot)  Patient Location: PACU  Anesthesia Type:GA combined with regional for post-op pain  Level of Consciousness: drowsy and patient cooperative  Airway & Oxygen Therapy: Patient Spontanous Breathing and Patient connected to face mask oxygen  Post-op Assessment: Report given to RN and Post -op Vital signs reviewed and stable  Post vital signs: Reviewed and stable  Last Vitals:  Vitals Value Taken Time  BP    Temp    Pulse 63 11/02/19 1214  Resp 14 11/02/19 1214  SpO2 100 % 11/02/19 1214  Vitals shown include unvalidated device data.  Last Pain:  Vitals:   11/02/19 0843  TempSrc: Tympanic  PainSc: 6          Complications: No apparent anesthesia complications

## 2019-11-02 NOTE — Op Note (Signed)
11/02/2019  12:13 PM  PATIENT:  [name]  69 y.o. male  PRE-OPERATIVE DIAGNOSIS: 1.  Painful right foot bunion deformity 2.  Right forefoot metatarsalgia 3.  Right second MTP joint arthritis 4.  Right second metatarsal painful hardware  POST-OPERATIVE DIAGNOSIS: Same  Procedure(s): 1.  Right foot modified McBride bunionectomy 2.  Right first TMT joint Lapidus arthrodesis 3.  Right hallux Akin osteotomy 4.  Right second metatarsal rotational osteotomy 5.  Removal of deep implant from the right second metatarsal 6.  Right foot AP, lateral and oblique radiographs  SURGEON:  Wylene Simmer, MD  ASSISTANT: Mechele Claude, PA-C  ANESTHESIA:   General, regional  EBL:  minimal   TOURNIQUET:   Total Tourniquet Time Documented: Thigh (Right) - 69 minutes Total: Thigh (Right) - 69 minutes  COMPLICATIONS:  None apparent  DISPOSITION:  Extubated, awake and stable to recovery.  INDICATION FOR PROCEDURE: The patient is a 69 year old male with a long history of right forefoot pain from a recurrent bunion as well as metatarsalgia and arthritis at the second MTP joint.  He has a painful screw at the second metatarsal head as well.  He has failed nonoperative treatment to date and presents today for surgical correction of these painful right forefoot deformities.  The risks and benefits of the alternative treatment options have been discussed in detail.  The patient wishes to proceed with surgery and specifically understands risks of bleeding, infection, nerve damage, blood clots, need for additional surgery, amputation and death.  PROCEDURE IN DETAIL:  After pre operative consent was obtained, and the correct operative site was identified, the patient was brought to the operating room and placed supine on the OR table.  Anesthesia was administered.  Pre-operative antibiotics were administered.  A surgical timeout was taken.  The right lower extremity was prepped and draped in standard sterile  fashion with a tourniquet around the thigh.  The extremity was elevated and the tourniquet was inflated to 250 mmHg.  The patient's previous first webspace incision was identified.  It was opened again sharply.  Dissection was carried down through the subcutaneous tissues.  The intermetatarsal ligament was divided under direct vision.  An arthrotomy was made between the lateral sesamoid and the metatarsal head.  Small perforations were made in the lateral joint capsule.  The hallux could then be positioned in 20 degrees of varus passively.  Attention was turned to the dorsal aspect of midfoot.  A longitudinal incision was made over the first TMT joint.  Dissection was carried down through the subcutaneous tissues.  Joint capsule was incised and elevated medially and laterally.  The oscillating saw was then used to resect the articular cartilage and subchondral bone from both sides of the joint.  The cut was beveled to take more bone laterally than medially to correct the intermetatarsal angle.  The waist bone was removed and the joint irrigated.  Both sides of the joint were perforated with a drill bit leaving the resultant bone graft in place.  The joint was reduced and provisionally pinned.  Radiographs confirmed appropriate correction of the intermetatarsal angle.  The joint was then fixed with a 4 mm partially-threaded cannulated screw from the [employer] set followed by a 3.5 mm LPS screw.  The radiographs confirmed appropriate correction of the intermetatarsal angle, but the toe was noted to have residual hallux valgus interphalangeus.  The decision was made to proceed with an Akin osteotomy.  An incision was made over the medial eminence extending  to the proximal phalanx.  Dissection was carried down through the subcutaneous tissues.  The joint capsule was elevated medially after longitudinal incision.  Subperiosteal dissection was carried dorsally and plantarly to the base of the proximal phalanx.  A  K wire was placed to mark the osteotomy.  Radiograph confirmed appropriate position of the osteotomy.  A medial closing wedge osteotomy was made with the oscillating saw.  The K wire was removed and the osteotomy closed.  The osteotomy was fixed with a 3.4 mm Biomet VPC screw.  Radiographs confirmed appropriate position of the screw and appropriate correction of the bunion deformity.  Attention was turned to the second MTP joint where the previous longitudinal incision was opened again sharply.  Dissection was carried down through the subcutaneous tissues.  The extensor tendons were mobilized with careful tenolysis.  The dorsal joint capsule was excised exposing the second metatarsal head.  Painful screw was identified.  It was cleaned of all surrounding overgrown bone and removed in its entirety.  The second metatarsal head was noted to have large peripheral osteophytes.  These were removed with a rondure.  A closing wedge dorsal osteotomy was then made with the oscillating saw rotating intact plantar cartilage to the dorsal aspect of the joint.  The osteotomy was fixed with a 2.5 mm Biomet VPC screw.  The wounds were irrigated copiously.  Final AP, lateral and oblique radiographs confirmed appropriate position and length of all hardware and appropriate correction of the bunion deformity.  Hardware is appropriately positioned and of the appropriate lengths.  Vancomycin powder was sprinkled in all of the wounds.  Deep subcutaneous tissues were approximated with Vicryl.  Superficial subcutaneous tissues were approximated with Monocryl.  Skin incisions were closed with nylon.  Sterile dressings were applied followed by a well-padded short leg splint.  The tourniquet was released after application of the dressings.  The patient was awakened from anesthesia and transported to the recovery room in stable condition.   FOLLOW UP PLAN: Nonweightbearing on the right lower extremity.  Follow-up in the office in 2 weeks  for suture removal and conversion to a short leg cast.  Aspirin for DVT prophylaxis.   RADIOGRAPHS: AP, lateral and oblique radiographs of the right foot are obtained intraoperatively.  These show interval removal of hardware from the second metatarsal head.  Second metatarsal osteotomy is noted with appropriate alignment and hardware position.  Interval correction of the bunion deformity with arthrodesis of the first TMT joint is also noted.  Hardware is appropriately positioned and of the appropriate lengths.  No other acute injuries are noted.    Mechele Claude PA-C was present and scrubbed for the duration of the operative case. His assistance was essential in positioning the patient, prepping and draping, gaining and maintaining exposure, performing the operation, closing and dressing the wounds and applying the splint.

## 2019-11-02 NOTE — Progress Notes (Signed)
Assisted Dr. Doroteo Glassman with right, ultrasound guided, popliteal, adductor canal block. Side rails up, monitors on throughout procedure. See vital signs in flow sheet. Tolerated Procedure well.

## 2019-11-02 NOTE — Anesthesia Procedure Notes (Addendum)
Anesthesia Regional Block: Popliteal block   Pre-Anesthetic Checklist: ,, timeout performed, Correct Patient, Correct Site, Correct Laterality, Correct Procedure, Correct Position, site marked, Risks and benefits discussed,  Surgical consent,  Pre-op evaluation,  At surgeon's request and post-op pain management  Laterality: Right  Prep: Maximum Sterile Barrier Precautions used, chloraprep       Needles:  Injection technique: Single-shot  Needle Type: Echogenic Stimulator Needle     Needle Length: 9cm  Needle Gauge: 22     Additional Needles:   Procedures:,,,, ultrasound used (permanent image in chart),,,,  Narrative:  Start time: 11/02/2019 9:00 AM End time: 11/02/2019 9:10 AM Injection made incrementally with aspirations every 5 mL.  Performed by: Personally  Anesthesiologist: Pervis Hocking, DO  Additional Notes: Monitors applied. No increased pain on injection. No increased resistance to injection. Injection made in 5cc increments. Good needle visualization. Patient tolerated procedure well.

## 2019-11-02 NOTE — Anesthesia Postprocedure Evaluation (Signed)
Anesthesia Post Note  Patient: [name]  Procedure(s) Performed: Right Foot Lapidus, Modified McBride Bunionectomy,  Hallux Akin Osteotomy (Right Foot) Second Metatarsal Removal of Deep Implant and Rotational Osteotomy (Right Foot)     Patient location during evaluation: PACU Anesthesia Type: Regional and General Level of consciousness: awake and alert, oriented and patient cooperative Pain management: pain level controlled Vital Signs Assessment: post-procedure vital signs reviewed and stable Respiratory status: spontaneous breathing, nonlabored ventilation and respiratory function stable Cardiovascular status: blood pressure returned to baseline and stable Postop Assessment: no apparent nausea or vomiting Anesthetic complications: no    Last Vitals:  Vitals:   11/02/19 1323 11/02/19 1349  BP: (!) 155/93 123/77  Pulse: 71 87  Resp: 18 18  Temp:  36.6 C  SpO2: 100% 100%    Last Pain:  Vitals:   11/02/19 1349  TempSrc: Oral  PainSc: 0-No pain        RLE Motor Response: No movement due to regional block (11/02/19 1355) RLE Sensation: Numbness;No pain (11/02/19 1355)      Pervis Hocking

## 2019-11-02 NOTE — Anesthesia Procedure Notes (Signed)
Anesthesia Regional Block: Adductor canal block   Pre-Anesthetic Checklist: ,, timeout performed, Correct Patient, Correct Site, Correct Laterality, Correct Procedure, Correct Position, site marked, Risks and benefits discussed,  Surgical consent,  Pre-op evaluation,  At surgeon's request and post-op pain management  Laterality: Right  Prep: Maximum Sterile Barrier Precautions used, chloraprep       Needles:  Injection technique: Single-shot  Needle Type: Echogenic Stimulator Needle     Needle Length: 9cm  Needle Gauge: 22     Additional Needles:   Procedures:,,,, ultrasound used (permanent image in chart),,,,  Narrative:  Start time: 11/02/2019 8:50 AM End time: 11/02/2019 9:00 AM Injection made incrementally with aspirations every 5 mL.  Performed by: Personally  Anesthesiologist: Pervis Hocking, DO  Additional Notes: Monitors applied. No increased pain on injection. No increased resistance to injection. Injection made in 5cc increments. Good needle visualization. Patient tolerated procedure well.

## 2019-11-02 NOTE — Anesthesia Procedure Notes (Signed)
Procedure Name: LMA Insertion Date/Time: 11/02/2019 10:41 AM Performed by: Signe Colt, CRNA Pre-anesthesia Checklist: Patient identified, Emergency Drugs available, Suction available and Patient being monitored Patient Re-evaluated:Patient Re-evaluated prior to induction Oxygen Delivery Method: Circle system utilized Preoxygenation: Pre-oxygenation with 100% oxygen Induction Type: IV induction Ventilation: Mask ventilation without difficulty LMA: LMA inserted LMA Size: 4.0 Number of attempts: 1 Airway Equipment and Method: Bite block Placement Confirmation: positive ETCO2 Tube secured with: Tape Dental Injury: Teeth and Oropharynx as per pre-operative assessment

## 2019-11-02 NOTE — Anesthesia Preprocedure Evaluation (Addendum)
Anesthesia Evaluation  Patient identified by MRN, date of birth, ID band Patient awake    Reviewed: Allergy & Precautions, NPO status , Patient's Chart, lab work & pertinent test results  History of Anesthesia Complications (+) history of anesthetic complications (hx urinary retention with GA)  Airway Mallampati: II  TM Distance: >3 FB Neck ROM: Full    Dental no notable dental hx. (+) Teeth Intact, Missing, Dental Advisory Given,    Pulmonary former smoker,  Quit smoking 1990   Pulmonary exam normal breath sounds clear to auscultation       Cardiovascular hypertension, Pt. on medications + CAD and + Peripheral Vascular Disease  Normal cardiovascular exam Rhythm:Regular Rate:Normal  HLD  Thoracic ascending aortic aneurysm- 4.2cm on last surveillance scan 2018   stress test 2017: Low risk stress nuclear study with a small fixed basal lateral defect, likely representing artifact. Otherwise normal perfusion and normal left ventricular regional and global systolic function.  Recurrent chest pain- took NTG yesterday    Neuro/Psych PSYCHIATRIC DISORDERS Depression Dementia parkinson'snegative neurological ROS     GI/Hepatic GERD  Medicated and Controlled,(+)     substance abuse  alcohol use, Hepatitis -, CHCV- treated Diverticulosis    Endo/Other  negative endocrine ROS  Renal/GU negative Renal ROS  negative genitourinary   Musculoskeletal negative musculoskeletal ROS (+) Arthritis , Osteoarthritis,    Abdominal Normal abdominal exam  (+)   Peds negative pediatric ROS (+)  Hematology negative hematology ROS (+)   Anesthesia Other Findings Right foot bunion  Reproductive/Obstetrics negative OB ROS                            Anesthesia Physical Anesthesia Plan  ASA: III  Anesthesia Plan: General and Regional   Post-op Pain Management: GA combined w/ Regional for post-op pain    Induction: Intravenous  PONV Risk Score and Plan: 2 and Ondansetron, Dexamethasone and Treatment may vary due to age or medical condition  Airway Management Planned: LMA  Additional Equipment: None  Intra-op Plan:   Post-operative Plan: Extubation in OR  Informed Consent: I have reviewed the patients History and Physical, chart, labs and discussed the procedure including the risks, benefits and alternatives for the proposed anesthesia with the patient or authorized representative who has indicated his/her understanding and acceptance.     Dental advisory given  Plan Discussed with: CRNA  Anesthesia Plan Comments:         Anesthesia Quick Evaluation

## 2019-11-02 NOTE — Discharge Instructions (Addendum)
Wylene Simmer, MD [facility]  Please read the following information regarding your care after surgery.  Medications  You only need a prescription for the narcotic pain medicine (ex. oxycodone, Percocet, Norco).  All of the other medicines listed below are available over the counter. X Aleve 2 pills twice a day for the first 3 days after surgery. X acetominophen (Tylenol) 650 mg every 4-6 hours as you need for minor to moderate pain X oxycodone as prescribed for severe pain  Narcotic pain medicine (ex. oxycodone, Percocet, Vicodin) will cause constipation.  To prevent this problem, take the following medicines while you are taking any pain medicine. X docusate sodium (Colace) 100 mg twice a day X senna (Senokot) 2 tablets twice a day  X To help prevent blood clots, take a baby aspirin (81 mg) twice a day for six weeks after surgery.  You should also get up every hour while you are awake to move around.    Weight Bearing X Do not bear any weight on the operated leg or foot.  Cast / Splint / Dressing X Keep your splint, cast or dressing clean and dry.  Don't put anything (coat hanger, pencil, etc) down inside of it.  If it gets damp, use a hair dryer on the cool setting to dry it.  If it gets soaked, call the office to schedule an appointment for a cast change.   After your dressing, cast or splint is removed; you may shower, but do not soak or scrub the wound.  Allow the water to run over it, and then gently pat it dry.  Swelling It is normal for you to have swelling where you had surgery.  To reduce swelling and pain, keep your toes above your nose for at least 3 days after surgery.  It may be necessary to keep your foot or leg elevated for several weeks.  If it hurts, it should be elevated.  Follow Up Call my office at [phone] when you are discharged from the hospital or surgery center to schedule an appointment to be seen two weeks after surgery.  Call my office at [phone] if  you develop a fever >101.5 F, nausea, vomiting, bleeding from the surgical site or severe pain.     Post Anesthesia Home Care Instructions  Activity: Get plenty of rest for the remainder of the day. A responsible individual must stay with you for 24 hours following the procedure.  For the next 24 hours, DO NOT: -Drive a car -Paediatric nurse -Drink alcoholic beverages -Take any medication unless instructed by your physician -Make any legal decisions or sign important papers.  Meals: Start with liquid foods such as gelatin or soup. Progress to regular foods as tolerated. Avoid greasy, spicy, heavy foods. If nausea and/or vomiting occur, drink only clear liquids until the nausea and/or vomiting subsides. Call your physician if vomiting continues.  Special Instructions/Symptoms: Your throat may feel dry or sore from the anesthesia or the breathing tube placed in your throat during surgery. If this causes discomfort, gargle with warm salt water. The discomfort should disappear within 24 hours.  Regional Anesthesia Blocks  1. Numbness or the inability to move the "blocked" extremity may last from 3-48 hours after placement. The length of time depends on the medication injected and your individual response to the medication. If the numbness is not going away after 48 hours, call your surgeon.  2. The extremity that is blocked will need to be protected until the numbness is gone and  the  Strength has returned. Because you cannot feel it, you will need to take extra care to avoid injury. Because it may be weak, you may have difficulty moving it or using it. You may not know what position it is in without looking at it while the block is in effect.  3. For blocks in the legs and feet, returning to weight bearing and walking needs to be done carefully. You will need to wait until the numbness is entirely gone and the strength has returned. You should be able to move your leg and foot normally  before you try and bear weight or walk. You will need someone to be with you when you first try to ensure you do not fall and possibly risk injury.  4. Bruising and tenderness at the needle site are common side effects and will resolve in a few days.  5. Persistent numbness or new problems with movement should be communicated to the surgeon or the [facility] [phone] [facility] [phone]).Call your surgeon if you experience:   1.  Fever over 101.0. 2.  Inability to urinate. 3.  Nausea and/or vomiting. 4.  Extreme swelling or bruising at the surgical site. 5.  Continued bleeding from the incision. 6.  Increased pain, redness or drainage from the incision. 7.  Problems related to your pain medication. 8.  Any problems and/or concerns  May take Tylenol at 3pm

## 2019-11-17 DIAGNOSIS — M7741 Metatarsalgia, right foot: Secondary | ICD-10-CM | POA: Diagnosis not present

## 2019-11-17 DIAGNOSIS — M21611 Bunion of right foot: Secondary | ICD-10-CM | POA: Diagnosis not present

## 2019-11-17 DIAGNOSIS — Z4789 Encounter for other orthopedic aftercare: Secondary | ICD-10-CM | POA: Diagnosis not present

## 2019-11-23 MED FILL — ALFUZOSIN HCL ER 10 MG TB24: 10 | 30 days supply | Qty: 30 | Fill #0

## 2019-11-27 ENCOUNTER — Telehealth: Payer: Self-pay

## 2019-11-27 ENCOUNTER — Other Ambulatory Visit: Payer: Self-pay | Admitting: Family Medicine

## 2019-11-27 DIAGNOSIS — F329 Major depressive disorder, single episode, unspecified: Secondary | ICD-10-CM

## 2019-11-27 DIAGNOSIS — F32A Depression, unspecified: Secondary | ICD-10-CM

## 2019-11-27 MED ORDER — CITALOPRAM HYDROBROMIDE 20 MG PO TABS: 20.0000 mg | ORAL_TABLET | Freq: Every day | ORAL | 2 refills | Status: DC

## 2019-11-27 MED FILL — PANTOPRAZOLE SOD DR 40 MG T: 40 | 90 days supply | Qty: 180 | Fill #0

## 2019-11-27 MED FILL — NITROGLYCERIN 0.4 MG TAB SL: 0.4 | 25 days supply | Qty: 25 | Fill #0

## 2019-11-27 MED FILL — CITALOPRAM HBR 20 MG TABLET: 20 | 30 days supply | Qty: 30 | Fill #0

## 2019-11-27 NOTE — Telephone Encounter (Signed)
Its time for a follow-up visit

## 2019-11-27 NOTE — Telephone Encounter (Signed)
Done KH 

## 2019-11-27 NOTE — Telephone Encounter (Signed)
[facility] is requesting to fill pt celexa. Pt has an appt 11-29-19. Please advise. kh

## 2019-11-27 NOTE — Telephone Encounter (Signed)
Received a fax from [facility] for a refill on the Citalopram and pt. Last apt was 02/23/19.

## 2019-11-29 ENCOUNTER — Telehealth (INDEPENDENT_AMBULATORY_CARE_PROVIDER_SITE_OTHER): Payer: 59 | Admitting: Family Medicine

## 2019-11-29 ENCOUNTER — Encounter: Payer: Self-pay | Admitting: Family Medicine

## 2019-11-29 ENCOUNTER — Other Ambulatory Visit: Payer: Self-pay

## 2019-11-29 VITALS — Wt 160.0 lb

## 2019-11-29 DIAGNOSIS — Z79899 Other long term (current) drug therapy: Secondary | ICD-10-CM

## 2019-11-29 DIAGNOSIS — Z8249 Family history of ischemic heart disease and other diseases of the circulatory system: Secondary | ICD-10-CM | POA: Diagnosis not present

## 2019-11-29 DIAGNOSIS — M48062 Spinal stenosis, lumbar region with neurogenic claudication: Secondary | ICD-10-CM

## 2019-11-29 DIAGNOSIS — E785 Hyperlipidemia, unspecified: Secondary | ICD-10-CM

## 2019-11-29 DIAGNOSIS — G20 Parkinson's disease: Secondary | ICD-10-CM

## 2019-11-29 DIAGNOSIS — I700 Atherosclerosis of aorta: Secondary | ICD-10-CM | POA: Diagnosis not present

## 2019-11-29 DIAGNOSIS — F329 Major depressive disorder, single episode, unspecified: Secondary | ICD-10-CM

## 2019-11-29 DIAGNOSIS — I10 Essential (primary) hypertension: Secondary | ICD-10-CM | POA: Diagnosis not present

## 2019-11-29 DIAGNOSIS — G20C Parkinsonism, unspecified: Secondary | ICD-10-CM

## 2019-11-29 DIAGNOSIS — N3281 Overactive bladder: Secondary | ICD-10-CM | POA: Diagnosis not present

## 2019-11-29 DIAGNOSIS — F32A Depression, unspecified: Secondary | ICD-10-CM

## 2019-11-29 LAB — LIPID PANEL

## 2019-11-29 MED FILL — ALFUZOSIN HCL ER 10 MG TB24: 10 | 30 days supply | Qty: 30 | Fill #0

## 2019-11-29 NOTE — Progress Notes (Signed)
   Subjective:    Patient ID: [name], male    DOB: 12-12-1950, 69 y.o.   MRN: [id number]  HPI  Interactive audio and video telecommunications were attempted between this provider and patient, however he did not have access to video capability.  We continued and completed visit with audio only.  The patient was located at home. The provider was located in the office. The patient did consent to this visit and is aware of possible charges through their insurance for this visit. The other persons participating in this telemedicine service were none. Time spent on call was 3 minutes and in review of previous records >22 minutes total. This virtual service is not related to other E/M service within previous 7 days. He recently had foot surgery which seem to have gone well.  He states that psychologically he is in a good spot.  He continues on Celexa.  His back pain with the underlying spinal stenosis is doing much better after his most recent injection.  He continues on his blood pressure medication, Avapro and amlodipine without trouble.[name]  He is having difficulty with reflux symptoms and is taking Protonix twice per day but still having symptoms.  He continues on his Parkinson's medication which seems to be working.  He has had difficulty with overactive bladder.  He states that the oxybutynin did cause difficulty with blurred vision and dry mouth.  He does have prescription for Uroxatrol.  He is taking Livalo and Lovaza; he has had difficulty with other statins causing myalgias.  Medical record indicates x-ray evidence of atherosclerosis.   Review of Systems     Objective:   Physical Exam Alert and in no distress otherwise not examined       Assessment & Plan:  Medication management - Plan: CBC with Differential/Platelet, Comprehensive metabolic panel, Lipid panel  Spinal stenosis of lumbar region with neurogenic claudication  Moderate essential hypertension  Hyperlipidemia with  target LDL less than 100 - Plan: Lipid panel  Abdominal aortic atherosclerosis (HCC)  Parkinsonian tremor (HCC)  Family history of heart disease in male family member before age 83 - Plan: Lipid panel  Depression, unspecified depression type  OAB (overactive bladder)  Recommend he try Uroxatrol for his OAB symptoms.  He will let me know how that works. Also he is to add Axid or Pepcid to his regimen to see if that will help with his reflux.  If it does not, we will need to refer to GI for further evaluation of that. Otherwise he will continue on his present medication regimen.

## 2019-11-30 ENCOUNTER — Ambulatory Visit: Payer: Medicare Other | Admitting: Neurology

## 2019-11-30 ENCOUNTER — Ambulatory Visit: Payer: Self-pay | Admitting: Neurology

## 2019-11-30 LAB — CBC WITH DIFFERENTIAL/PLATELET
Basophils Absolute: 0.1 10*3/uL (ref 0.0–0.2)
Basos: 1 %
EOS (ABSOLUTE): 0.1 10*3/uL (ref 0.0–0.4)
Eos: 1 %
Hematocrit: 42.2 % (ref 37.5–51.0)
Hemoglobin: 14.3 g/dL (ref 13.0–17.7)
Immature Grans (Abs): 0 10*3/uL (ref 0.0–0.1)
Immature Granulocytes: 0 %
Lymphocytes Absolute: 1.4 10*3/uL (ref 0.7–3.1)
Lymphs: 27 %
MCH: 31.6 pg (ref 26.6–33.0)
MCHC: 33.9 g/dL (ref 31.5–35.7)
MCV: 93 fL (ref 79–97)
Monocytes Absolute: 0.7 10*3/uL (ref 0.1–0.9)
Monocytes: 12 %
Neutrophils Absolute: 3 10*3/uL (ref 1.4–7.0)
Neutrophils: 59 %
Platelets: 297 10*3/uL (ref 150–450)
RBC: 4.53 x10E6/uL (ref 4.14–5.80)
RDW: 12.7 % (ref 11.6–15.4)
WBC: 5.2 10*3/uL (ref 3.4–10.8)

## 2019-11-30 LAB — LIPID PANEL
Chol/HDL Ratio: 2.8 ratio (ref 0.0–5.0)
Cholesterol, Total: 148 mg/dL (ref 100–199)
HDL: 53 mg/dL (ref >39)
LDL Chol Calc (NIH): 84 mg/dL (ref 0–99)
Triglycerides: 54 mg/dL (ref 0–149)
VLDL Cholesterol Cal: 11 mg/dL (ref 5–40)

## 2019-11-30 LAB — COMPREHENSIVE METABOLIC PANEL
ALT: 10 IU/L (ref 0–44)
AST: 16 IU/L (ref 0–40)
Albumin/Globulin Ratio: 1.6 (ref 1.2–2.2)
Albumin: 4.4 g/dL (ref 3.8–4.8)
Alkaline Phosphatase: 84 IU/L (ref 39–117)
BUN/Creatinine Ratio: 17 (ref 10–24)
BUN: 23 mg/dL (ref 8–27)
Bilirubin Total: 0.4 mg/dL (ref 0.0–1.2)
CO2: 25 mmol/L (ref 20–29)
Calcium: 9.4 mg/dL (ref 8.6–10.2)
Chloride: 102 mmol/L (ref 96–106)
Creatinine, Ser: 1.38 mg/dL — ABNORMAL HIGH (ref 0.76–1.27)
GFR calc Af Amer: 60 mL/min/{1.73_m2} (ref >59)
GFR calc non Af Amer: 52 mL/min/{1.73_m2} — ABNORMAL LOW (ref >59)
Globulin, Total: 2.7 g/dL (ref 1.5–4.5)
Glucose: 81 mg/dL (ref 65–99)
Potassium: 4.4 mmol/L (ref 3.5–5.2)
Sodium: 138 mmol/L (ref 134–144)
Total Protein: 7.1 g/dL (ref 6.0–8.5)

## 2019-12-11 ENCOUNTER — Encounter (INDEPENDENT_AMBULATORY_CARE_PROVIDER_SITE_OTHER): Payer: 59 | Admitting: Ophthalmology

## 2019-12-11 DIAGNOSIS — K5901 Slow transit constipation: Secondary | ICD-10-CM | POA: Diagnosis not present

## 2019-12-11 DIAGNOSIS — R079 Chest pain, unspecified: Secondary | ICD-10-CM | POA: Diagnosis not present

## 2019-12-11 DIAGNOSIS — K224 Dyskinesia of esophagus: Secondary | ICD-10-CM | POA: Diagnosis not present

## 2019-12-12 ENCOUNTER — Other Ambulatory Visit: Payer: Self-pay | Admitting: Cardiology

## 2019-12-12 ENCOUNTER — Other Ambulatory Visit: Payer: Self-pay | Admitting: Gastroenterology

## 2019-12-12 ENCOUNTER — Encounter (INDEPENDENT_AMBULATORY_CARE_PROVIDER_SITE_OTHER): Payer: Self-pay | Admitting: Ophthalmology

## 2019-12-12 ENCOUNTER — Ambulatory Visit: Payer: Medicare Other | Admitting: Neurology

## 2019-12-12 ENCOUNTER — Other Ambulatory Visit: Payer: Self-pay

## 2019-12-12 ENCOUNTER — Ambulatory Visit (INDEPENDENT_AMBULATORY_CARE_PROVIDER_SITE_OTHER): Payer: 59 | Admitting: Ophthalmology

## 2019-12-12 DIAGNOSIS — H34812 Central retinal vein occlusion, left eye, with macular edema: Secondary | ICD-10-CM

## 2019-12-12 DIAGNOSIS — H18231 Secondary corneal edema, right eye: Secondary | ICD-10-CM

## 2019-12-12 DIAGNOSIS — H02401 Unspecified ptosis of right eyelid: Secondary | ICD-10-CM | POA: Diagnosis not present

## 2019-12-12 DIAGNOSIS — R079 Chest pain, unspecified: Secondary | ICD-10-CM

## 2019-12-12 MED FILL — LIVALO 1 MG TABLET: 1 | 28 days supply | Qty: 8 | Fill #0

## 2019-12-12 MED FILL — IRBESARTAN 300 MG TABS: 300 | 30 days supply | Qty: 30 | Fill #1

## 2019-12-12 NOTE — Assessment & Plan Note (Signed)
Patient appointment with Dr. Lorina Rabon, Dr. Leonard Schwartz each eye plastic surgeons that have worked do work here in [employer]

## 2019-12-12 NOTE — Progress Notes (Signed)
12/12/2019     CHIEF COMPLAINT Patient presents for Retina Follow Up   HISTORY OF PRESENT ILLNESS: [name] is a 69 y.o. male who presents to the clinic today for:   HPI    Retina Follow Up    Patient presents with  CRVO/BRVO.  In left eye.  Duration of 6 months.  Since onset it is stable.          Comments    6 month follow up - OCT OU, FP OU Patient states the difference in the vision in both eyes is troublesome. No other complaints.         Last edited by Gerda Diss on 12/12/2019  3:06 PM. (History)      Referring physician: Denita Lung, MD [place],  [place] [address]  HISTORICAL INFORMATION:   Selected notes from the MEDICAL RECORD NUMBER       CURRENT MEDICATIONS: Current Outpatient Medications (Ophthalmic Drugs)  Medication Sig  . timolol (TIMOPTIC) 0.5 % ophthalmic solution Place 1 drop into the left eye daily.    No current facility-administered medications for this visit. (Ophthalmic Drugs)   Current Outpatient Medications (Other)  Medication Sig  . Acetaminophen (ACETAMIN PO) Take 500 mg by mouth every 4 (four) hours as needed (pain).   [name] alfuzosin (UROXATRAL) 10 MG 24 hr tablet Take 10 mg by mouth at bedtime.   [name] aspirin EC 81 MG tablet Take 1 tablet (81 mg total) by mouth 2 (two) times daily.  . calcium-vitamin D (OSCAL WITH D) 250-125 MG-UNIT tablet Take 1 tablet by mouth daily.  . carbidopa-levodopa (SINEMET IR) 25-100 MG tablet TAKE 2 TABLETS BY MOUTH THREE TIMES DAILY  . citalopram (CELEXA) 20 MG tablet Take 1 tablet (20 mg total) by mouth daily.  [name] docusate sodium (COLACE) 100 MG capsule Take 1 capsule (100 mg total) by mouth daily as needed.  . irbesartan (AVAPRO) 300 MG tablet TAKE 1 TABLET (300 MG TOTAL) BY MOUTH EVERY MORNING.  [name] LIVALO 1 MG TABS TAKE 1 TABLET (1 MG TOTAL) BY MOUTH 2 TIMES A WEEK. TAKES ON MONDAY AND FRIDAY  . Multiple Vitamin (MULTIVITAMIN WITH MINERALS) TABS tablet Take 1 tablet by mouth 4  (four) times a week.   . nitroGLYCERIN (NITROSTAT) 0.4 MG SL tablet PLACE 1 TABLET UNDER THE TONGUE EVERY 5 MINUTES AS NEEDED FOR CHEST PAIN.  [name] omega-3 acid ethyl esters (LOVAZA) 1 g capsule Take by mouth 2 (two) times daily.  . pantoprazole (PROTONIX) 40 MG tablet Take 40 mg by mouth 2 (two) times daily.   . rasagiline (AZILECT) 0.5 MG TABS tablet   . senna (SENOKOT) 8.6 MG TABS tablet Take 2 tablets (17.2 mg total) by mouth 2 (two) times daily. (Patient not taking: Reported on 11/29/2019)  . trihexyphenidyl (ARTANE) 2 MG tablet Take 0.5 tablets (1 mg total) by mouth 2 (two) times daily with a meal.  . verapamil (CALAN-SR) 240 MG CR tablet Take 1 tablet (240 mg total) by mouth at bedtime.  . vitamin B-12 (CYANOCOBALAMIN) 1000 MCG tablet Take 1,000 mcg by mouth 3 (three) times a week.    No current facility-administered medications for this visit. (Other)      REVIEW OF SYSTEMS:    ALLERGIES No Known Allergies  PAST MEDICAL HISTORY Past Medical History:  Diagnosis Date  . Allergy   . Arthritis   . BPH (benign prostatic hyperplasia)   . Complication of anesthesia    Pt. stated he  had a reaction that ended in him requiring urinary cath placement  . Diverticulosis   . GERD (gastroesophageal reflux disease)    esophageal spasms  . Glaucoma   . Gout   . Head injury, closed, with concussion   . Hepatitis C    chronic - Has been treated with Harvoni  . HLD (hyperlipidemia)    statin intolerant (Crestor & Simvastatin) - Taking Livalo 1mg  / week  . Hypertension   . Parkinson's disease ([place])   . Plantar fasciitis    right  . PVD (peripheral vascular disease) ([place])    With no claudication; only mild abdominal aortic atherosclerosis noted on ultrasound.  . Thoracic ascending aortic aneurysm ([facility])    4.2 cm ascending TAA 09/2016 CT, 1 yr f/u rec  . Ulcer    Past Surgical History:  Procedure Laterality Date  . BUNIONECTOMY WITH WEIL OSTEOTOMY Right 11/02/2019   Procedure: Right  Foot Lapidus, Modified McBride Bunionectomy,  Hallux Akin Osteotomy;  Surgeon: Wylene Simmer, MD;  Location: [facility];  Service: Orthopedics;  Laterality: Right;  . CARDIAC CATHETERIZATION  2005   30% Cx. Dr. Melvern Banker  . cataract surgery Left 11/05/2014  . COLONOSCOPY    . ESOPHAGOGASTRODUODENOSCOPY N/A 05/02/2015   Procedure: ESOPHAGOGASTRODUODENOSCOPY (EGD);  Surgeon: Ronald Lobo, MD;  Location: [facility];  Service: Endoscopy;  Laterality: N/A;  . ESOPHAGOGASTRODUODENOSCOPY  05/02/2015   no source of pt chest pain endoscopically evident. small hiatal hernia.  [name] EYE SURGERY    . HARDWARE REMOVAL Right 11/02/2019   Procedure: Second Metatarsal Removal of Deep Implant and Rotational Osteotomy;  Surgeon: Wylene Simmer, MD;  Location: [facility];  Service: Orthopedics;  Laterality: Right;  . MEMBRANE PEEL Left 03/14/2014   Procedure: MEMBRANE PEEL; ENDOLASER;  Surgeon: Hurman Horn, MD;  Location: [facility];  Service: Ophthalmology;  Laterality: Left;  . NM MYOVIEW LTD  2011   Neg Ischemia or infarct.  [name] NM MYOVIEW LTD  10/2015   LOW RISK. Small, fixed basal lateral defect - likely diaphragmatic attenuation. EF 69%  . PARS PLANA VITRECTOMY Left 03/14/2014   Procedure: PARS PLANA VITRECTOMY WITH 25 GAUGE;  Surgeon: Hurman Horn, MD;  Location: [facility];  Service: Ophthalmology;  Laterality: Left;  . TONSILLECTOMY    . TRANSTHORACIC ECHOCARDIOGRAM  2013   Normal EF. No significant Valve Disease  . UPPER GASTROINTESTINAL ENDOSCOPY    . WEIL OSTEOTOMY Right 09/01/2017   Procedure: RIGHT GREAT TOE CHEVRON AND WEIL OSTEOTOMY 2ND METATARSAL;  Surgeon: Newt Minion, MD;  Location: [facility];  Service: Orthopedics;  Laterality: Right;    FAMILY HISTORY Family History  Problem Relation Age of Onset  . Heart disease Mother   . Cancer Paternal Grandfather   . Cancer Sister     SOCIAL HISTORY Social History   Tobacco Use  . Smoking status: Former Smoker    Types: Cigars     Quit date: 07/27/1988    Years since quitting: 31.3  . Smokeless tobacco: Never Used  Substance Use Topics  . Alcohol use: Yes    Alcohol/week: 3.0 standard drinks    Types: 1 Glasses of wine, 1 Cans of beer, 1 Shots of liquor per week    Comment: occasional  . Drug use: No         OPHTHALMIC EXAM: Base Eye Exam    Visual Acuity (Snellen - Linear)      Right Left   Dist cc CF @ 3' 20/20-1  Dist ph cc NI        Tonometry (Tonopen, 3:11 PM)      Right Left   Pressure 13 15       Pupils      Pupils Dark Light Shape React APD   Right PERRL 4 3 Irregular Slow +1   Left PERRL 4 3 Round Slow None       Visual Fields (Counting fingers)      Left Right     Full   Restrictions Partial outer superior temporal, superior nasal deficiencies        Extraocular Movement      Right Left    Full Full       Neuro/Psych    Oriented x3: Yes   Mood/Affect: Normal       Dilation    Both eyes: 1.0% Mydriacyl, 2.5% Phenylephrine @ 3:11 PM        Slit Lamp and Fundus Exam    External Exam      Right Left   External Normal Normal       Slit Lamp Exam      Right Left   Lids/Lashes levator dehiscence with slight ptosis. 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   Left Eye Progression has been stable. Disc findings include pallor.   Notes Old retinal vein occlusion with secondary glaucoma. Good PRP. Now quiesced sent yet with macular atrophy. Media clear. There is a tube shunt shadow in the visual axis  OS, good sector PRP inferotemporally from an old retinal vein occlusion. Media clear. No active disease.       OCT, Retina - OU - Both Eyes       Right Eye Quality was good. Scan locations included subfoveal. Progression has no prior data. Findings include abnormal foveal contour.   Left Eye Quality was good. Scan locations included subfoveal. Central Foveal Thickness: 370. Progression has improved.   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 No active maculopathy at this time  OS with old retinal vein occlusion inferotemporally, good sector PRP, no active disease and less CME                ASSESSMENT/PLAN:  Ptosis of right eyelid Patient appointment with Dr. Lorina Rabon, Dr. Leonard Schwartz each eye plastic surgeons that have worked do work here in [place] retinal vein occlusion with macular edema of left eye Currently quiescent in the left eye. Improved status post  antiveg F medications as well as sector PRP and finally surgical intervention  Secondary corneal edema of right eye Secondary to multiple surgeries and pseudophakia anterior chamber with a tube shunt. Patient instructed not to mash or rub the eyes      ICD-10-CM   1. 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 Secondary corneal edema of right eye  H18.231   3. Ptosis of right eyelid  H02.401     1. No active CME nor active maculopathy left  eye. Stable  2. Shaded vision in the right eye could be from the lid ptosis from levator dehiscence. There is some role of the mild corneal edema and some cloudiness in the media in this regard.  3. Patient will be given the names of Dr. Lorina Rabon, Leonard Schwartz and practiced with one another for ophthalmic plastics  Ophthalmic Meds Ordered this visit:  No orders of the defined types were placed in this encounter.      No follow-ups on file.  There are no Patient Instructions on file for this visit.   Explained the diagnoses, plan, and follow up with the patient and they expressed understanding.  Patient expressed understanding of the importance of proper follow up care.   Clent Demark Johnwilliam Shepperson M.D. Diseases & Surgery of the Retina and Vitreous Retina & Diabetic [place] 12/12/19     Abbreviations: M myopia (nearsighted); A astigmatism; H hyperopia (farsighted); P presbyopia; Mrx spectacle prescription;  CTL contact lenses; OD right eye; OS left eye; OU both eyes  XT exotropia; ET esotropia; PEK punctate epithelial keratitis; PEE punctate epithelial erosions; DES dry eye syndrome; MGD meibomian gland dysfunction; ATs artificial tears; PFAT's preservative free artificial tears; [place] nuclear sclerotic cataract; PSC posterior subcapsular cataract; ERM epi-retinal membrane; PVD posterior vitreous detachment; RD retinal detachment; DM diabetes mellitus; DR diabetic retinopathy; NPDR non-proliferative diabetic retinopathy; PDR proliferative diabetic retinopathy; CSME clinically significant macular edema; DME diabetic macular edema; dbh dot blot hemorrhages; CWS cotton wool spot; POAG primary open angle glaucoma; C/D cup-to-disc ratio; HVF humphrey visual field; GVF goldmann visual field; OCT optical coherence tomography; IOP intraocular pressure; BRVO Branch retinal vein occlusion; CRVO central retinal vein occlusion; CRAO central retinal artery occlusion; BRAO branch retinal artery occlusion; RT retinal  tear; SB scleral buckle; PPV pars plana vitrectomy; VH Vitreous hemorrhage; PRP panretinal laser photocoagulation; IVK intravitreal kenalog; VMT vitreomacular traction; MH Macular hole;  NVD neovascularization of the disc; NVE neovascularization elsewhere; AREDS age related eye disease study; ARMD age related macular degeneration; POAG primary open angle glaucoma; EBMD epithelial/anterior basement membrane dystrophy; ACIOL anterior chamber intraocular lens; IOL intraocular lens; PCIOL posterior chamber intraocular lens; Phaco/IOL phacoemulsification with intraocular lens placement; [place] photorefractive keratectomy; LASIK laser assisted in situ keratomileusis; HTN hypertension; DM diabetes mellitus; COPD chronic obstructive pulmonary disease

## 2019-12-12 NOTE — Assessment & Plan Note (Signed)
Secondary to multiple surgeries and pseudophakia anterior chamber with a tube shunt. Patient instructed not to mash or rub the eyes

## 2019-12-12 NOTE — Assessment & Plan Note (Signed)
Currently quiescent in the left eye. Improved status post antiveg F medications as well as sector PRP and finally surgical intervention

## 2019-12-18 DIAGNOSIS — M21611 Bunion of right foot: Secondary | ICD-10-CM | POA: Diagnosis not present

## 2019-12-18 DIAGNOSIS — Z4789 Encounter for other orthopedic aftercare: Secondary | ICD-10-CM | POA: Diagnosis not present

## 2019-12-18 DIAGNOSIS — M79671 Pain in right foot: Secondary | ICD-10-CM | POA: Diagnosis not present

## 2019-12-18 DIAGNOSIS — M7741 Metatarsalgia, right foot: Secondary | ICD-10-CM | POA: Diagnosis not present

## 2019-12-19 ENCOUNTER — Other Ambulatory Visit: Payer: Self-pay

## 2019-12-19 ENCOUNTER — Encounter: Payer: Self-pay | Admitting: Family Medicine

## 2019-12-19 ENCOUNTER — Ambulatory Visit (INDEPENDENT_AMBULATORY_CARE_PROVIDER_SITE_OTHER): Payer: 59 | Admitting: Family Medicine

## 2019-12-19 VITALS — BP 104/68 | HR 75 | Temp 97.1°F | Wt 157.4 lb

## 2019-12-19 DIAGNOSIS — H6122 Impacted cerumen, left ear: Secondary | ICD-10-CM

## 2019-12-19 NOTE — Progress Notes (Signed)
   Subjective:    Patient ID: [name], male    DOB: [birth year], 69 y.o.   MRN: [id number]  HPI He is here for evaluation of difficulty hearing from the left ear.  No pain,, sore throat, fever or chills.   Review of Systems     Objective:   Physical Exam Cerumen noted in the left canal.  The canal was lavaged and the TM and canal is normal.  Neck is supple without adenopathy.       Assessment & Plan:  Impacted cerumen of left ear

## 2019-12-27 MED FILL — ALFUZOSIN HCL ER 10 MG TB24: 10 | 30 days supply | Qty: 30 | Fill #0

## 2020-01-08 MED FILL — CITALOPRAM HBR 20 MG TABLET: 20 | 30 days supply | Qty: 30 | Fill #1

## 2020-01-08 MED FILL — IRBESARTAN 300 MG TABS: 300 | 30 days supply | Qty: 30 | Fill #2

## 2020-01-08 MED FILL — LIVALO 1 MG TABLET: 1 | 28 days supply | Qty: 8 | Fill #1

## 2020-01-08 MED FILL — TRIHEXYPHENIDYL HCL 2 MG TA: 2 | 30 days supply | Qty: 30 | Fill #2

## 2020-01-10 ENCOUNTER — Encounter: Payer: Self-pay | Admitting: Neurology

## 2020-01-10 ENCOUNTER — Ambulatory Visit (INDEPENDENT_AMBULATORY_CARE_PROVIDER_SITE_OTHER): Payer: 59 | Admitting: Neurology

## 2020-01-10 ENCOUNTER — Other Ambulatory Visit: Payer: Self-pay | Admitting: Neurology

## 2020-01-10 VITALS — BP 123/79 | HR 59 | Wt 161.0 lb

## 2020-01-10 DIAGNOSIS — G20 Parkinson's disease: Secondary | ICD-10-CM

## 2020-01-10 DIAGNOSIS — G3184 Mild cognitive impairment, so stated: Secondary | ICD-10-CM | POA: Diagnosis not present

## 2020-01-10 MED ORDER — CARBIDOPA-LEVODOPA 25-100 MG PO TABS: 1.5000 | ORAL_TABLET | Freq: Three times a day (TID) | ORAL | 1 refills | Status: DC

## 2020-01-10 MED FILL — CARBIDOPA-LEVODOPA 25-100 T: 25-100 | 90 days supply | Qty: 405 | Fill #0

## 2020-01-10 NOTE — Progress Notes (Signed)
[facility] PATIENT: [name] DOB: Mar 16, 1951   REASON FOR VISIT: Follow-up for tremor mild cognitive impairment, depression HISTORY FROM: Patient  HISTORY OF PRESENT ILLNESS:HISTORY:63 year African American male who since last year and a half has noticed increasing tremors mainly in the left arm and leg and to a lesser degree in the right arm as well. He states the tremors were quite mild but became more pronounced after a minor accident while staying [place] the rental [place] in [place]. He fell down 12 flights of steps and hit a concrete slab and had a concussion. He and some minor bruises and knee injury which took several months to reck of her period CT scan of the head was done 2 days later which was unremarkable. Since then he has had some walking difficulty but this may be related to his knee pain. He says that his feet do at times gets stuck in his started walking with a stooped posture. He however does not describe typical festination. He has resting and intermittent action tremor claiming the left arm and leg the tremor does improve with activities. The tremor does not seem to interfere with most of his routine. He denies significant bradykinesia, and drooling of saliva or micrographia. He has been evaluated by neurologists Dr. Reginia Forts at [facility] neurology but he is unable to tell me for the diagnosis was. He was not told that this may be Parkinson's and was not tried on dopaminergic medications. He did have an MRI scan and some lab work but I do not have those results for my review available today. Patient is here today for a second opinion as he feels his tremor is not getting better. He does admit to some light masonry hallucinations as well as restless sleep thrashing of his legs. He denies significant memory or cognitive difficulties. He has not noticed any particular effect of alcohol on his tremor. Is no family history of tremors. Patient does have  chronic hepatitis C and is planning to start treatment with dapsone. He has had no seizures, significant head injury with major loss of consciousness, stroke, TIAs or significant neurological problems.  Update 08/14/2014 : He returns for follow-up after last visit 3 months ago. He feels his tremors are about the same. He had tried reducing the dose of Sinemet but noticed that his tremors got worse and hence he went back up to the original dose which is 25/100 one tablet 3 times daily. He still feels occasionally confused and at times secondary to some cells but he admits that he worries a lot. He also admits to feeling depressed, getting tired easily and not having initiated. He has not been on any medications for depression. Patient denies significant drooling of saliva, bradykinesia, gait or balance problems. He feels his tremor is mild and does not interfere with his activities of daily living. I discussed alternative treatment options including addition of dopamine agonist, anticholinergic or even consideration for deep brain stimulation since his tremor is predominantly unilateral however the patient does not want to consider more aggressive treatment options at the present time. UPDATE 12/26/14 [name], 69 year old black male returns for follow-up. He was last seen by Dr. [name] 08/14/2014. He feels his tremors are better since increasing his carbidopa levodopa to 1-1/2 tablets 3 times daily. He is tolerating it well without dizziness, sleepiness, nightmares or hallucinations A MRI Brain with and without Contrast completed on 07/29/2011 was normal. The Mini-Mental status exam today is unchanged 29 out  of 30. He did not follow-up with his primary care for  complaints of depression. He denies any significant drooling bradykinesia falls or balance problems. His tremor does not interfere with his activities of daily living. He returns for reevaluation Update 07/03/2015 : He returns for follow-up after last  visit 6 months ago. He states his tremors continue to do well and he seems to have responded quite well to increased dose of Sinemet 25/100 1-1/2 tablet 3 times daily. Is tolerating it well without any dizziness, sleepiness, nightmares, hallucinations, nausea or diarrhea. He continues to have mild memory difficulties but his has not been on any treatment for depression and in fact has not seen his primary care physician for the same for quite some time. Update 08/11/2016 PS : He returns for follow-up after last visit year ago. He states his tremors as well as memory loss or more or less unchanged. Continues to have intermittent tremors in the left hand mainly. The tremors fluctuate he has good days and bad days. He is currently taking Sinemet 25/100 one and half tablet 3 times daily. He complains of mild fatigue and daytime sleepiness. He denies hallucinations, delusions, dizziness or upset stomach. He does not want to try higher dose because of fear of side effects. Continues to have short-term memory difficulties. He has not been quite compulsive about taking notes and using memory compensation strategies but plans to do so. He has no new complaints today. UPDATE 07/19/2018CM [name], 69 year old male returns for follow-up with tremors and mild cognitive impairment which he says is stable. He continues to have a resting tremor in the left hand which is intermittent. He continues to have good and bad days. He remains on Sinemet 25 100 (1 and half tablets) 3 times daily. He does little exercise he is not doing any memory compensation strategies at this time and was encouraged to do so. He denies any falls. He denies any hallucinations or increased confusion. He denies any freezing spells. He does complain of left hip pain He returns for reevaluation UPDATE 1/22/2019CM [name], 39 he denies 40-year-old male returns for follow-up with a history of Parkinson's disease.  He continues to have mild resting tremor in  the left hand which is intermittent.  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  He remains on Sinemet 3 times a day however he is taking his medication with a meal.  He complains of sometimes being lightheaded when he stands up too quickly.  He was encouraged to sit on the side of the bed and then get up slightly.  He was also encouraged to stay well-hydrated.  He has not been exercising.  He denies any hallucinations or increased confusion.  He has not had any freezing spells.  He returns for reevaluation 02/15/18 UPDATE:JV  Patient returns today for six-month follow-up.  He continues to take Sinemet 3 times daily with the first dose around 6 AM (1 hour prior to eating breakfast), second dose 11 AM (1 hour prior to eating lunch) and then third dose between 5:30 and 6 PM (eats dinner around 6:30-7 PM).  Patient denies any increase in symptoms between doses but does state if he is late on a dose, he will start having increased tremors in his left upper extremity and left lower extremity.  He continues to complain of short-term memory issue and is unable to state whether it is worsening but he states he notices it more.  He did obtain a large book full  avoid games but does this approximately 1 time per week.  Recommended to do these activities at least once daily.  Patient also has complaints of feeling as though he overanalyzes certain situations and feeling increased fatigue.  Patient does endorse increased stress recently but does feel somewhat situational.  Provided patient with stress relaxation techniques and advised him if this continues, to follow-up with PCP for possible medication management.  He denies any hallucinations or increased confusion.  He denies any freezing spells. UPDATE 1/29/2020CM [name], 69 year old male returns for follow-up with a history of Parkinson's disease and mild cognitive impairment.  He denies any freezing spells any stiffness any falls.  He denies any difficulty  swallowing.  He has spinal stenosis and had recent injection.  He is seeing Dr. Sharol Given for a foot problem.  He is currently on Sinemet 3 times a day before meals.  He has not been exercising and was encouraged to do so.  He has not had any hallucinations or increased confusion.  Noted very intermittent left tremor of the hand.  Patient reports some anxiety in public.  He was encouraged to perform stress relaxation techniques.  He returns for reevaluation  : Update 02/23/2019 :  he returns for follow-up after last visit 6 months ago.  He states his tremors are doing well until a month ago when he is noticed worsening of his tremor both at rest as well as using his hands.  He remains on Sinemet 25/100 mg 1-1/2 tablet 3 times daily.  Is tolerating them well without any nausea, diarrhea, dizziness, sleepiness or hallucinations.  He states his short-term memory difficulties are unchanged.  He has not been socializing a lot or being active but plans to do that soon.  He has no other complaints.  He denies significant drooling, bradykinesia, stooped posture gait or balance problems Update 09/04/2019 : He returns for follow-up after last visit 6 months ago.  He states that his tremors continue to intermittently not do well.  He has no more bad days than good days.  Has noticed some tremor in his legs as well intermittently.  Patient was advised to try higher dose of Sinemet 2 tablets 3 times daily at last visit.  He states he was not able to tolerate this because of dizziness and had to cut back to 1-1/2 3 times daily which is tolerating better.  He is also noticed that his balance is off but he blames this on having some problems in his toes.  He has had no major falls or injuries.  He can still get up easily from his chair.  He denies significant drooling of saliva.  He does have some at times disturbed sleep and reviewed dreams and talks in his sleep.  Gets startled easily. Update 01/10/2020 : He returns for follow-up  after last visit 4 months ago.  Is accompanied by his wife.  The patient was started on Azilect at last visit but had trouble tolerating it daily due to sleepiness, tiredness and dizziness.  He has since switched to using Azilect 0.5 mg twice daily every other day and seems to be tolerating it better.  However his tremors still persists and appear to get worse mostly when he is anxious or with severe exertion.  He feels the tremor are not particularly disabling and he can handle it.  Continues to have poor short-term memory and needs to write things down but still remembers only occasionally.  Sometimes he may remember later.  Wife  notes that occasionally he may pause in midsentence and searches for words.  At times even starts stuttering as he is trying to find the word.  He has no family history of Alzheimer's or dementia.  Still fairly independent and manages most activities for himself.  He remains currently on Sinemet 25/100 1.5 tablets 4 times daily which is tolerating well without significant nightmares, hallucinations, daytime sleepiness or nausea. REVIEW OF SYSTEMS: Full 14 system review of systems performed and notable only for those listed,   tremors, imbalance, dizziness and memory difficulties only and all other systems negative ALLERGIES: No Known Allergies  HOME MEDICATIONS: Outpatient Medications Prior to Visit  Medication Sig Dispense Refill  . Acetaminophen (ACETAMIN PO) Take 500 mg by mouth every 4 (four) hours as needed (pain).     [name] alfuzosin (UROXATRAL) 10 MG 24 hr tablet Take 10 mg by mouth at bedtime.     [name] aspirin EC 81 MG tablet Take 1 tablet (81 mg total) by mouth 2 (two) times daily. 84 tablet 0  . calcium-vitamin D (OSCAL WITH D) 250-125 MG-UNIT tablet Take 1 tablet by mouth daily.    . citalopram (CELEXA) 20 MG tablet Take 1 tablet (20 mg total) by mouth daily. 30 tablet 2  . docusate sodium (COLACE) 100 MG capsule Take 1 capsule (100 mg total) by mouth daily as needed. 30  capsule 2  . irbesartan (AVAPRO) 300 MG tablet TAKE 1 TABLET (300 MG TOTAL) BY MOUTH EVERY MORNING. 30 tablet 3  . LIVALO 1 MG TABS TAKE 1 TABLET (1 MG TOTAL) BY MOUTH 2 TIMES A WEEK. TAKES ON MONDAY AND FRIDAY 25 tablet 3  . Multiple Vitamin (MULTIVITAMIN WITH MINERALS) TABS tablet Take 1 tablet by mouth 4 (four) times a week.     . nitroGLYCERIN (NITROSTAT) 0.4 MG SL tablet PLACE 1 TABLET UNDER THE TONGUE EVERY 5 MINUTES AS NEEDED FOR CHEST PAIN. 25 tablet 3  . omega-3 acid ethyl esters (LOVAZA) 1 g capsule Take by mouth 2 (two) times daily.    . pantoprazole (PROTONIX) 40 MG tablet Take 40 mg by mouth 2 (two) times daily.   0  . rasagiline (AZILECT) 0.5 MG TABS tablet Take 0.5 mg by mouth in the morning and at bedtime.    . senna (SENOKOT) 8.6 MG TABS tablet Take 2 tablets (17.2 mg total) by mouth 2 (two) times daily. 30 tablet 0  . timolol (TIMOPTIC) 0.5 % ophthalmic solution Place 1 drop into the left eye daily.     . trihexyphenidyl (ARTANE) 2 MG tablet Take 0.5 tablets (1 mg total) by mouth 2 (two) times daily with a meal. (Patient taking differently: Take 1 mg by mouth 2 (two) times daily with a meal. Every other day) 30 tablet 2  . trihexyphenidyl (ARTANE) 2 MG tablet     . verapamil (CALAN-SR) 240 MG CR tablet Take 1 tablet (240 mg total) by mouth at bedtime. 90 tablet 3  . vitamin B-12 (CYANOCOBALAMIN) 1000 MCG tablet Take 1,000 mcg by mouth 3 (three) times a week.     . carbidopa-levodopa (SINEMET IR) 25-100 MG tablet TAKE 2 TABLETS BY MOUTH THREE TIMES DAILY 405 tablet 1   No facility-administered medications prior to visit.    PAST MEDICAL HISTORY: Past Medical History:  Diagnosis Date  . Allergy   . Arthritis   . BPH (benign prostatic hyperplasia)   . Complication of anesthesia    Pt. stated he had a reaction that ended in him requiring  urinary cath placement  . Diverticulosis   . GERD (gastroesophageal reflux disease)    esophageal spasms  . Glaucoma   . Gout   . Head  injury, closed, with concussion   . Hepatitis C    chronic - Has been treated with Harvoni  . HLD (hyperlipidemia)    statin intolerant (Crestor & Simvastatin) - Taking Livalo 1mg  / week  . Hypertension   . Parkinson's disease ([place])   . Plantar fasciitis    right  . PVD (peripheral vascular disease) ([place])    With no claudication; only mild abdominal aortic atherosclerosis noted on ultrasound.  . Thoracic ascending aortic aneurysm ([facility])    4.2 cm ascending TAA 09/2016 CT, 1 yr f/u rec  . Ulcer     PAST SURGICAL HISTORY: Past Surgical History:  Procedure Laterality Date  . BUNIONECTOMY WITH WEIL OSTEOTOMY Right 11/02/2019   Procedure: Right Foot Lapidus, Modified McBride Bunionectomy,  Hallux Akin Osteotomy;  Surgeon: Wylene Simmer, MD;  Location: [facility];  Service: Orthopedics;  Laterality: Right;  . CARDIAC CATHETERIZATION  2005   30% Cx. Dr. Melvern Banker  . cataract surgery Left 11/05/2014  . COLONOSCOPY    . ESOPHAGOGASTRODUODENOSCOPY N/A 05/02/2015   Procedure: ESOPHAGOGASTRODUODENOSCOPY (EGD);  Surgeon: Ronald Lobo, MD;  Location: [facility];  Service: Endoscopy;  Laterality: N/A;  . ESOPHAGOGASTRODUODENOSCOPY  05/02/2015   no source of pt chest pain endoscopically evident. small hiatal hernia.  [name] EYE SURGERY    . HARDWARE REMOVAL Right 11/02/2019   Procedure: Second Metatarsal Removal of Deep Implant and Rotational Osteotomy;  Surgeon: Wylene Simmer, MD;  Location: [facility];  Service: Orthopedics;  Laterality: Right;  . MEMBRANE PEEL Left 03/14/2014   Procedure: MEMBRANE PEEL; ENDOLASER;  Surgeon: Hurman Horn, MD;  Location: [facility];  Service: Ophthalmology;  Laterality: Left;  . NM MYOVIEW LTD  2011   Neg Ischemia or infarct.  [name] NM MYOVIEW LTD  10/2015   LOW RISK. Small, fixed basal lateral defect - likely diaphragmatic attenuation. EF 69%  . PARS PLANA VITRECTOMY Left 03/14/2014   Procedure: PARS PLANA VITRECTOMY WITH 25 GAUGE;  Surgeon: Hurman Horn, MD;  Location: [facility];  Service: Ophthalmology;  Laterality: Left;  . TONSILLECTOMY    . TRANSTHORACIC ECHOCARDIOGRAM  2013   Normal EF. No significant Valve Disease  . UPPER GASTROINTESTINAL ENDOSCOPY    . WEIL OSTEOTOMY Right 09/01/2017   Procedure: RIGHT GREAT TOE CHEVRON AND WEIL OSTEOTOMY 2ND METATARSAL;  Surgeon: Newt Minion, MD;  Location: [facility];  Service: Orthopedics;  Laterality: Right;    FAMILY HISTORY: Family History  Problem Relation Age of Onset  . Heart disease Mother   . Cancer Paternal Grandfather   . Cancer Sister     SOCIAL HISTORY: Social History   Socioeconomic History  . Marital status: Married    Spouse name: barbra gen  . Number of children: 3  . Years of education: AS  . Highest education level: Not on file  Occupational History  . 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 Smoking status: Former Smoker    Types: Cigars    Quit date: 07/27/1988    Years since quitting: 31.4  . Smokeless tobacco: Never Used  Vaping Use  . Vaping Use: Never used  Substance and Sexual Activity  . Alcohol use: Yes    Alcohol/week: 3.0 standard drinks    Types: 1 Glasses of wine, 1 Cans  of beer, 1 Shots of liquor per week    Comment: occasional  . Drug use: No  . Sexual activity: Yes  Other Topics Concern  . 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     Wife is retired Therapist, sports from [employer]   He walks roughly 2-3 miles in bed time at least 2-3 times a week. He quit smoking 30 years ago.   Social Determinants of Health   Financial Resource Strain:   . Difficulty of Paying Living Expenses:   Food Insecurity:   . Worried About Charity fundraiser in the Last Year:   . Arboriculturist in the Last Year:   Transportation Needs:   . Film/video editor (Medical):   [name] Lack of Transportation (Non-Medical):   Physical Activity:   . Days of Exercise per Week:    . Minutes of Exercise per Session:   Stress:   . Feeling of Stress :   Social Connections:   . Frequency of Communication with Friends and Family:   . Frequency of Social Gatherings with Friends and Family:   . Attends Religious Services:   . Active Member of Clubs or Organizations:   . Attends Archivist Meetings:   [name] Marital Status:   Intimate Partner Violence:   . Fear of Current or Ex-Partner:   . Emotionally Abused:   [name] Physically Abused:   . Sexually Abused:      PHYSICAL EXAM  Vitals:   01/10/20 1325  BP: 123/79  Pulse: (!) 59  Weight: 161 lb (73 kg)   Body mass index is 22.77 kg/m.  Generalized: Frail elderly African-American male in no acute distress   Head: normocephalic and atraumatic,.   Neck: Supple,  Musculoskeletal: No deformity right foot boot for recent bunion surgery  Neurological examination   Mentation: Alert. Clock drawing 4/4, able to name 14 animals which walk on 4 legs.  Normal  Recall 3/3.  Able to name 13 animals which can walk on 4 legs. MMSE - Mini Mental State Exam 08/24/2018 05/11/2014  Orientation to time 5 5  Orientation to Place 5 5  Registration 3 3  Attention/ Calculation 5 5  Recall 2 2  Language- name 2 objects 2 2  Language- repeat 1 1  Language- follow 3 step command 3 3  Language- read & follow direction 1 1  Write a sentence 1 1  Write a sentence-comments no subject -  Copy design 1 1  Total score 29 29  Follows all commands speech and language fluent.  Negative glabellar tap. Cranial nerve II-XII: Pupils were equal round reactive to light extraocular movements were full, visual field were full on confrontational test. Facial sensation and strength were normal. hearing was intact to finger rubbing bilaterally. Uvula tongue midline. head turning and shoulder shrug were normal and symmetric.Tongue protrusion into cheek strength was normal. Motor: normal bulk and tone, full strength in the BUE, BLE, .  Diminished  amplitude of finger tapping on the left with fatigue, intermittent left hand resting tremor, mild cogwheel rigidity upon activation at the left wrist.. Sensory: normal and symmetric to light touch, on the face arms and legs  Coordination: finger-nose-finger, heel-to-shin bilaterally, no dysmetria Reflexes: 1+ upper lower and symmetric plantar responses were flexor bilaterally. Gait and Station: Rising up from seated position without assistance, no festination or stooped posture.  Ambulated well in the hall  moderate  stride, decreased  arm swing on the left., smooth turning, no assistive device.  Right foot movements limited by boot for recent bunion surgery DIAGNOSTIC DATA (LABS, IMAGING, TESTING) - I reviewed patient records, labs, notes, testing and imaging myself where available.  Lab Results  Component Value Date   WBC 5.2 11/29/2019   HGB 14.3 11/29/2019   HCT 42.2 11/29/2019   MCV 93 11/29/2019   PLT 297 11/29/2019      Component Value Date/Time   NA 138 11/29/2019 1412   K 4.4 11/29/2019 1412   CL 102 11/29/2019 1412   CO2 25 11/29/2019 1412   GLUCOSE 81 11/29/2019 1412   GLUCOSE 113 (H) 04/19/2018 0411   BUN 23 11/29/2019 1412   CREATININE 1.38 (H) 11/29/2019 1412   CREATININE 1.06 06/29/2014 0920   CALCIUM 9.4 11/29/2019 1412   PROT 7.1 11/29/2019 1412   ALBUMIN 4.4 11/29/2019 1412   AST 16 11/29/2019 1412   ALT 10 11/29/2019 1412   ALKPHOS 84 11/29/2019 1412   BILITOT 0.4 11/29/2019 1412   GFRNONAA 52 (L) 11/29/2019 1412   GFRAA 60 11/29/2019 1412   Lab Results  Component Value Date   CHOL 148 11/29/2019   HDL 53 11/29/2019   LDLCALC 84 11/29/2019   TRIG 54 11/29/2019   CHOLHDL 2.8 11/29/2019     Lab Results  Component Value Date   TSH 1.950 09/28/2016    ASSESSMENT AND PLAN 69 y.o. 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 Good response to  Sinemet but recent worsening of tremors but has been unable to tolerate higher dose due to side effects.. He also complains of mild cognitive impairment which appears stable.    PLAN: I had a long discussion with the patient and his wife regarding his parkinsonian tremor which seems fairly well controlled on the current medication regimen of Sinemet 25/100 1-1/2 tablet 3 times daily which is tolerating well without side effects.  I recommend he increase the Azilect to 0.5 mg twice daily from every other day.  I discussed possible side effects advised him to call me if needed.  He also has mild cognitive impairment I advised him to increase participation in mentally challenging activities like solving crossword puzzles, playing bridge and sodoku.  We also discussed memory compensation strategies.  He will return for follow-up in the future in 6 months or call earlier if necessary.. I spent 30 minutes in total face to face time with the patient more than 50% of which was spent counseling and coordination of care, reviewing test results reviewing medications and discussing and reviewing the diagnosis of Parkinson's disease and mild cognitive impairment and importance of cognitively challenging activities  Antony Contras, MD   [facility] [address], [place], [place] [address] [phone]

## 2020-01-10 NOTE — Patient Instructions (Signed)
I had a long discussion with the patient and his wife regarding his parkinsonian tremor which seems fairly well controlled on the current medication regimen of Sinemet 25/101-1/2 tablet 3 times daily which is tolerating well without side effects.  I recommend he increase the Azilect to 0.5 mg twice daily from every other day.  I discussed possible side effects advised him to call me if needed.  He also has mild cognitive impairment I advised him to increase participation in mentally challenging activities like solving crossword puzzles, playing bridge and sodoku.  We also discussed memory compensation strategies.  He will return for follow-up in the future in 6 months or call earlier if necessary. Memory Compensation Strategies  1. Use "WARM" strategy.  W= write it down  A= associate it  R= repeat it  M= make a mental note  2.   You can keep a Social worker.  Use a 3-ring notebook with sections for the following: calendar, important names and phone numbers,  medications, doctors' names/phone numbers, lists/reminders, and a section to journal what you did  each day.   3.    Use a calendar to write appointments down.  4.    Write yourself a schedule for the day.  This can be placed on the calendar or in a separate section of the Memory Notebook.  Keeping a  regular schedule can help memory.  5.    Use medication organizer with sections for each day or morning/evening pills.  You may need help loading it  6.    Keep a basket, or pegboard by the door.  Place items that you need to take out with you in the basket or on the pegboard.  You may also want to  include a message board for reminders.  7.    Use sticky notes.  Place sticky notes with reminders in a place where the task is performed.  For example: " turn off the  stove" placed by the stove, "lock the door" placed on the door at eye level, " take your medications" on  the bathroom mirror or by the place where you normally take your  medications.  8.    Use alarms/timers.  Use while cooking to remind yourself to check on food or as a reminder to take your medicine, or as a  reminder to make a call, or as a reminder to perform another task, etc.

## 2020-01-11 ENCOUNTER — Telehealth: Payer: Self-pay | Admitting: Neurology

## 2020-01-11 NOTE — Telephone Encounter (Signed)
Dr .[name] pt had office visit on 01/10/2020. PT stated he is no longer taking rsasagiline(azilect).

## 2020-01-11 NOTE — Telephone Encounter (Signed)
Why he told me he was taking 0.5 mg bid on alternate days and I asked him to increase it to daily

## 2020-01-11 NOTE — Telephone Encounter (Signed)
Pt has called asking that Dr [name] be made aware he is no longer taking rasagiline (AZILECT) 0.5 MG TABS tablet

## 2020-01-15 DIAGNOSIS — M79671 Pain in right foot: Secondary | ICD-10-CM | POA: Diagnosis not present

## 2020-01-15 DIAGNOSIS — Z4789 Encounter for other orthopedic aftercare: Secondary | ICD-10-CM | POA: Diagnosis not present

## 2020-01-16 NOTE — Addendum Note (Signed)
Addended by: Marval Regal on: 01/16/2020 08:57 AM   Modules accepted: Orders

## 2020-01-16 NOTE — Telephone Encounter (Signed)
I called pt and he is only taking carbidopa-levodopa and trihyxyphenidyl artane) for his parkinson. Pt stated he stop take the rasagiline(azilect0 because he was having side effects from it. I stated message will be sent to Dr [name] for [facility].

## 2020-01-17 ENCOUNTER — Ambulatory Visit (INDEPENDENT_AMBULATORY_CARE_PROVIDER_SITE_OTHER): Payer: 59 | Admitting: Family Medicine

## 2020-01-17 ENCOUNTER — Encounter: Payer: Self-pay | Admitting: Family Medicine

## 2020-01-17 ENCOUNTER — Other Ambulatory Visit: Payer: Self-pay

## 2020-01-17 ENCOUNTER — Ambulatory Visit: Payer: 59 | Admitting: Family Medicine

## 2020-01-17 VITALS — BP 122/80 | HR 68 | Temp 97.7°F | Wt 158.6 lb

## 2020-01-17 DIAGNOSIS — E785 Hyperlipidemia, unspecified: Secondary | ICD-10-CM | POA: Diagnosis not present

## 2020-01-17 DIAGNOSIS — H34812 Central retinal vein occlusion, left eye, with macular edema: Secondary | ICD-10-CM | POA: Diagnosis not present

## 2020-01-17 DIAGNOSIS — G20C Parkinsonism, unspecified: Secondary | ICD-10-CM

## 2020-01-17 DIAGNOSIS — N3281 Overactive bladder: Secondary | ICD-10-CM

## 2020-01-17 DIAGNOSIS — F329 Major depressive disorder, single episode, unspecified: Secondary | ICD-10-CM | POA: Diagnosis not present

## 2020-01-17 DIAGNOSIS — Z8249 Family history of ischemic heart disease and other diseases of the circulatory system: Secondary | ICD-10-CM | POA: Diagnosis not present

## 2020-01-17 DIAGNOSIS — I10 Essential (primary) hypertension: Secondary | ICD-10-CM

## 2020-01-17 DIAGNOSIS — F32A Depression, unspecified: Secondary | ICD-10-CM

## 2020-01-17 DIAGNOSIS — K219 Gastro-esophageal reflux disease without esophagitis: Secondary | ICD-10-CM

## 2020-01-17 DIAGNOSIS — H348192 Central retinal vein occlusion, unspecified eye, stable: Secondary | ICD-10-CM

## 2020-01-17 DIAGNOSIS — G20 Parkinson's disease: Secondary | ICD-10-CM

## 2020-01-17 DIAGNOSIS — Z0000 Encounter for general adult medical examination without abnormal findings: Secondary | ICD-10-CM

## 2020-01-17 DIAGNOSIS — H9111 Presbycusis, right ear: Secondary | ICD-10-CM

## 2020-01-17 DIAGNOSIS — H02401 Unspecified ptosis of right eyelid: Secondary | ICD-10-CM

## 2020-01-17 DIAGNOSIS — Z23 Encounter for immunization: Secondary | ICD-10-CM

## 2020-01-17 DIAGNOSIS — G3184 Mild cognitive impairment, so stated: Secondary | ICD-10-CM | POA: Diagnosis not present

## 2020-01-17 DIAGNOSIS — I700 Atherosclerosis of aorta: Secondary | ICD-10-CM

## 2020-01-17 LAB — POCT URINALYSIS DIP (PROADVANTAGE DEVICE)
Bilirubin, UA: NEGATIVE
Blood, UA: NEGATIVE
Glucose, UA: NEGATIVE mg/dL
Ketones, POC UA: NEGATIVE mg/dL
Leukocytes, UA: NEGATIVE
Nitrite, UA: NEGATIVE
Protein Ur, POC: NEGATIVE mg/dL
Specific Gravity, Urine: 1.01
Urobilinogen, Ur: 0.2
pH, UA: 7.5 (ref 5.0–8.0)

## 2020-01-17 MED ORDER — CITALOPRAM HYDROBROMIDE 40 MG PO TABS: 40.0000 mg | ORAL_TABLET | Freq: Every day | ORAL | 3 refills | Status: DC

## 2020-01-17 NOTE — Progress Notes (Signed)
   Subjective:    Patient ID: [name], male    DOB: 06/06/1951, 70 y.o.   MRN: [id number]  HPI He is here for complete examination.  He is followed by neurology for his underlying mild cognitive disorder as well as Parkinson's.  He seems to be doing fairly well on his Sinemet.  He recently had foot surgery and seems to be recovering from that fairly well.  He is taking Uroxatrol for his OAB but is not happy with the results.  Apparently he was given a different medication but stopped it due to side effects but he is unsure exactly what the side effects were.  He does not know the name of the medication and it is not listed in the chart.  He continues on Livalo for his lipids and having no difficulty with that.  He is also taking Avapro for his blood pressure.  The depression seems to be under fairly good control but he would like to increase his Celexa to see if that would help benefit him.  He is followed regularly by ophthalmology for his retinal vein occlusion.  He has also noticed some difficulty with constipation and question some weight loss.  No nausea, vomiting, abdominal pain.  He is doing well on Protonix for his reflux symptoms.  He does have a hearing aid that he uses on the right side.  Presently he is not working due to recent surgery.  His home life is going well.  Family and social history as well as health maintenance and immunizations was reviewed.   Review of Systems  All other systems reviewed and are negative.      Objective:   Physical Exam Alert and in no distress. Tympanic membranes and canals are normal. Pharyngeal area is normal. Neck is supple without adenopathy or thyromegaly. Cardiac exam shows a regular sinus rhythm without murmurs or gallops. Lungs are clear to auscultation. Abdominal exam shows no masses or tenderness.       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  Depression, unspecified depression type - Plan: citalopram (CELEXA) 40 MG tablet  Abdominal aortic atherosclerosis (HCC)  Family history of heart disease in male family member before age 37  Need for shingles vaccine - Plan: Varicella-zoster vaccine IM (Shingrix)  Ptosis of right eyelid  Retinal vein occlusion, unspecified laterality, unspecified retinal vein  Presbycusis of right ear, unspecified hearing status on contralateral side  Gastroesophageal reflux disease without esophagitis  He will continue on his present medication regimen and has follow-up with neurology, ophthalmology as well as urology.  Did recommend the use of MiraLAX to help with his constipation.  He is to return here in 2 months for follow-up on his Shingrix and also we will monitor his weight.  He is to call me in 1 month to let me know about the increased dose of the Celexa.

## 2020-01-17 NOTE — Addendum Note (Signed)
Addended by: Elyse Jarvis on: 01/17/2020 03:03 PM   Modules accepted: Orders

## 2020-01-22 NOTE — Telephone Encounter (Signed)
Ok

## 2020-01-26 MED FILL — ALFUZOSIN HCL ER 10 MG TB24: 10 | 30 days supply | Qty: 30 | Fill #0

## 2020-02-06 ENCOUNTER — Other Ambulatory Visit: Payer: Self-pay | Admitting: Cardiology

## 2020-02-06 MED FILL — LIVALO 1 MG TABLET: 1 | 28 days supply | Qty: 8 | Fill #2

## 2020-02-06 MED FILL — IRBESARTAN 300 MG TAB: 300 | 30 days supply | Qty: 30 | Fill #3

## 2020-02-06 MED FILL — CITALOPRAM HBR 20 MG TABLET: 20 | 30 days supply | Qty: 30 | Fill #2

## 2020-02-08 ENCOUNTER — Other Ambulatory Visit: Payer: Self-pay | Admitting: Cardiology

## 2020-02-08 MED FILL — VERAPAMIL HCL ER 240 MG TBC: 240 | 90 days supply | Qty: 90 | Fill #0

## 2020-02-14 ENCOUNTER — Other Ambulatory Visit (HOSPITAL_COMMUNITY): Payer: Self-pay | Admitting: Urology

## 2020-02-14 DIAGNOSIS — N401 Enlarged prostate with lower urinary tract symptoms: Secondary | ICD-10-CM | POA: Diagnosis not present

## 2020-02-14 DIAGNOSIS — R351 Nocturia: Secondary | ICD-10-CM | POA: Diagnosis not present

## 2020-02-16 DIAGNOSIS — M79671 Pain in right foot: Secondary | ICD-10-CM | POA: Diagnosis not present

## 2020-02-16 DIAGNOSIS — M7741 Metatarsalgia, right foot: Secondary | ICD-10-CM | POA: Diagnosis not present

## 2020-02-26 ENCOUNTER — Telehealth: Payer: Self-pay

## 2020-02-26 ENCOUNTER — Other Ambulatory Visit: Payer: Self-pay | Admitting: Neurology

## 2020-02-26 MED FILL — TRIHEXYPHENIDYL HCL 2 MG TA: 2 | 30 days supply | Qty: 30 | Fill #0

## 2020-02-26 MED FILL — ALFUZOSIN HCL ER 10 MG TB24: 10 | 90 days supply | Qty: 90 | Fill #0

## 2020-02-26 NOTE — Telephone Encounter (Signed)
I have called the patient to obtain more information as to why he would like to stop the medication. There was no answer. LVM advising that Dr [name] is out of the office and may be next week before we would be able to give direction on whether he can stop that medication or not. I did advise the patient to call back to review.

## 2020-02-26 NOTE — Telephone Encounter (Signed)
Pt called office and left VM asking for a call to discuss stopping his artane.

## 2020-02-28 DIAGNOSIS — R351 Nocturia: Secondary | ICD-10-CM | POA: Diagnosis not present

## 2020-02-28 DIAGNOSIS — N401 Enlarged prostate with lower urinary tract symptoms: Secondary | ICD-10-CM | POA: Diagnosis not present

## 2020-02-28 DIAGNOSIS — R350 Frequency of micturition: Secondary | ICD-10-CM | POA: Diagnosis not present

## 2020-03-04 ENCOUNTER — Encounter (INDEPENDENT_AMBULATORY_CARE_PROVIDER_SITE_OTHER): Payer: Self-pay | Admitting: Ophthalmology

## 2020-03-04 ENCOUNTER — Encounter (INDEPENDENT_AMBULATORY_CARE_PROVIDER_SITE_OTHER): Payer: 59 | Admitting: Ophthalmology

## 2020-03-04 ENCOUNTER — Ambulatory Visit (INDEPENDENT_AMBULATORY_CARE_PROVIDER_SITE_OTHER): Payer: 59 | Admitting: Ophthalmology

## 2020-03-04 ENCOUNTER — Other Ambulatory Visit: Payer: Self-pay

## 2020-03-04 DIAGNOSIS — H18231 Secondary corneal edema, right eye: Secondary | ICD-10-CM | POA: Diagnosis not present

## 2020-03-04 DIAGNOSIS — H34812 Central retinal vein occlusion, left eye, with macular edema: Secondary | ICD-10-CM | POA: Diagnosis not present

## 2020-03-04 DIAGNOSIS — H35063 Retinal vasculitis, bilateral: Secondary | ICD-10-CM | POA: Diagnosis not present

## 2020-03-04 NOTE — Progress Notes (Signed)
03/04/2020     CHIEF COMPLAINT Patient presents for Retina Follow Up   HISTORY OF PRESENT ILLNESS: [name] is a 69 y.o. male who presents to the clinic today for:   HPI    Retina Follow Up    Patient presents with  Other.  In both eyes.  Duration of 2 weeks.  Since onset it is stable.          Comments    WIP -  Patient states for the past 2 weeks, occasionally when he looks at different objects or letters, they have a colorful glow around them. Patient states that after a while it eventually goes away.       Last edited by Gerda Diss on 03/04/2020  1:45 PM. (History)      Referring physician: Denita Lung, MD [place],  [place] [id number]  HISTORICAL INFORMATION:   Selected notes from the MEDICAL RECORD NUMBER       CURRENT MEDICATIONS: Current Outpatient Medications (Ophthalmic Drugs)  Medication Sig   timolol (TIMOPTIC) 0.5 % ophthalmic solution Place 1 drop into the left eye daily.    No current facility-administered medications for this visit. (Ophthalmic Drugs)   Current Outpatient Medications (Other)  Medication Sig   Acetaminophen (ACETAMIN PO) Take 500 mg by mouth every 4 (four) hours as needed (pain).    alfuzosin (UROXATRAL) 10 MG 24 hr tablet Take 10 mg by mouth at bedtime.    aspirin EC 81 MG tablet Take 1 tablet (81 mg total) by mouth 2 (two) times daily.   calcium-vitamin D (OSCAL WITH D) 250-125 MG-UNIT tablet Take 1 tablet by mouth daily.   carbidopa-levodopa (SINEMET IR) 25-100 MG tablet Take 1.5 tablets by mouth 3 (three) times daily.   citalopram (CELEXA) 40 MG tablet Take 1 tablet (40 mg total) by mouth daily.   docusate sodium (COLACE) 100 MG capsule Take 1 capsule (100 mg total) by mouth daily as needed.   irbesartan (AVAPRO) 300 MG tablet TAKE 1 TABLET (300 MG TOTAL) BY MOUTH EVERY MORNING.   LIVALO 1 MG TABS TAKE 1 TABLET (1 MG TOTAL) BY MOUTH 2 TIMES A WEEK. TAKES ON MONDAY AND FRIDAY   Multiple  Vitamin (MULTIVITAMIN WITH MINERALS) TABS tablet Take 1 tablet by mouth 4 (four) times a week.    nitroGLYCERIN (NITROSTAT) 0.4 MG SL tablet PLACE 1 TABLET UNDER THE TONGUE EVERY 5 MINUTES AS NEEDED FOR CHEST PAIN.   omega-3 acid ethyl esters (LOVAZA) 1 g capsule Take by mouth 2 (two) times daily.   pantoprazole (PROTONIX) 40 MG tablet Take 40 mg by mouth 2 (two) times daily.    senna (SENOKOT) 8.6 MG TABS tablet Take 2 tablets (17.2 mg total) by mouth 2 (two) times daily.   trihexyphenidyl (ARTANE) 2 MG tablet TAKE 1/2 TABLET BY MOUTH TWO TIMES DAILY WITH MEALS   verapamil (CALAN-SR) 240 MG CR tablet TAKE 1 TABLET (240 MG TOTAL) BY MOUTH AT BEDTIME.   vitamin B-12 (CYANOCOBALAMIN) 1000 MCG tablet Take 1,000 mcg by mouth 3 (three) times a week.    No current facility-administered medications for this visit. (Other)      REVIEW OF SYSTEMS:    ALLERGIES No Known Allergies  PAST MEDICAL HISTORY Past Medical History:  Diagnosis Date   Allergy    Arthritis    BPH (benign prostatic hyperplasia)    Complication of anesthesia    Pt. stated he had a reaction that ended in him requiring urinary  cath placement   Diverticulosis    GERD (gastroesophageal reflux disease)    esophageal spasms   Glaucoma    Gout    Head injury, closed, with concussion    Hepatitis C    chronic - Has been treated with Harvoni   HLD (hyperlipidemia)    statin intolerant (Crestor & Simvastatin) - Taking Livalo 1mg  / week   Hypertension    Parkinson's disease ([place])    Plantar fasciitis    right   PVD (peripheral vascular disease) ([place])    With no claudication; only mild abdominal aortic atherosclerosis noted on ultrasound.   Thoracic ascending aortic aneurysm (HCC)    4.2 cm ascending TAA 09/2016 CT, 1 yr f/u rec   Ulcer    Past Surgical History:  Procedure Laterality Date   BUNIONECTOMY WITH WEIL OSTEOTOMY Right 11/02/2019   Procedure: Right Foot Lapidus, Modified McBride  Bunionectomy,  Hallux Akin Osteotomy;  Surgeon: Wylene Simmer, MD;  Location: [place];  Service: Orthopedics;  Laterality: Right;   CARDIAC CATHETERIZATION  2005   30% Cx. Dr. Melvern Banker   cataract surgery Left 11/05/2014   COLONOSCOPY     ESOPHAGOGASTRODUODENOSCOPY N/A 05/02/2015   Procedure: ESOPHAGOGASTRODUODENOSCOPY (EGD);  Surgeon: Ronald Lobo, MD;  Location: [facility];  Service: Endoscopy;  Laterality: N/A;   ESOPHAGOGASTRODUODENOSCOPY  05/02/2015   no source of pt chest pain endoscopically evident. small hiatal hernia.   EYE SURGERY     HARDWARE REMOVAL Right 11/02/2019   Procedure: Second Metatarsal Removal of Deep Implant and Rotational Osteotomy;  Surgeon: Wylene Simmer, MD;  Location: [place] [facility];  Service: Orthopedics;  Laterality: Right;   MEMBRANE PEEL Left 03/14/2014   Procedure: MEMBRANE PEEL; ENDOLASER;  Surgeon: Hurman Horn, MD;  Location: [place];  Service: Ophthalmology;  Laterality: Left;   NM MYOVIEW LTD  2011   Neg Ischemia or infarct.   NM MYOVIEW LTD  10/2015   LOW RISK. Small, fixed basal lateral defect - likely diaphragmatic attenuation. EF 69%   PARS PLANA VITRECTOMY Left 03/14/2014   Procedure: PARS PLANA VITRECTOMY WITH 25 GAUGE;  Surgeon: Hurman Horn, MD;  Location: [place] [facility];  Service: Ophthalmology;  Laterality: Left;   TONSILLECTOMY     TRANSTHORACIC ECHOCARDIOGRAM  2013   Normal EF. No significant Valve Disease   UPPER GASTROINTESTINAL ENDOSCOPY     WEIL OSTEOTOMY Right 09/01/2017   Procedure: RIGHT GREAT TOE CHEVRON AND WEIL OSTEOTOMY 2ND METATARSAL;  Surgeon: Newt Minion, MD;  Location: [facility];  Service: Orthopedics;  Laterality: Right;    FAMILY HISTORY Family History  Problem Relation Age of Onset   Heart disease Mother    Cancer Paternal Grandfather    Cancer Sister     SOCIAL HISTORY Social History   Tobacco Use   Smoking status: Former Smoker    Types: Cigars    Quit date: 07/27/1988    Years  since quitting: 31.6   Smokeless tobacco: Never Used  Vaping Use   Vaping Use: Never used  Substance Use Topics   Alcohol use: Yes    Alcohol/week: 3.0 standard drinks    Types: 1 Glasses of wine, 1 Cans of beer, 1 Shots of liquor per week    Comment: occasional   Drug use: No         OPHTHALMIC EXAM:  Base Eye Exam    Visual Acuity (Snellen - Linear)      Right Left   Dist cc CF @ 6' 20/20  Dist ph cc NI    Correction: Glasses       Tonometry (Tonopen, 1:49 PM)      Right Left   Pressure 17 21       Pupils      Dark Light Shape React APD   Right 4 3 Irregular Slow +1   Left 4 3 Round Slow None       Visual Fields (Counting fingers)      Left Right    Full Full       Extraocular Movement      Right Left    Full Full       Neuro/Psych    Oriented x3: Yes   Mood/Affect: Normal       Dilation    Both eyes: 1.0% Mydriacyl, 2.5% Phenylephrine @ 1:49 PM        Slit Lamp and Fundus Exam    External Exam      Right Left   External Normal Normal       Slit Lamp Exam      Right Left   Lids/Lashes Normal Normal   Conjunctiva/Sclera White and quiet White and quiet   Cornea Clear Clear   Anterior Chamber Deep and quiet Deep and quiet   Iris Round and reactive Round and reactive   Lens Anterior chamber intraocular lens Posterior chamber intraocular lens   Anterior Vitreous Normal Normal       Fundus Exam      Right Left   Posterior Vitreous Vitrectomized, clear Vitrectomized   Disc 2+ Pallor Normal   C/D Ratio 0.75 0.55   Macula Diffuse atrophy, no macular thickening Microaneurysms, no macular thickening   Vessels Old, RVO old vasculitis now inactive Old macular BRVO, inactive   Periphery Good PRP Good PRP sector          IMAGING AND PROCEDURES  Imaging and Procedures for 03/04/20  Color Fundus Photography Optos - OU - Both Eyes       Right Eye Progression has been stable. Disc findings include increased cup to disc ratio, pallor.    Left Eye Progression has been stable. Disc findings include increased cup to disc ratio. Macula : flat.   Notes OS, with old branch retinal vein occlusion inferiorly, resolved CME.  Clear media.  No active vasculitis.  No clinical thickening. \ OD, old retinal vasculitis disease  (hep C related), CRV O, now optic pallor all this now quiescent good PRP.  Quiescent.                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      ICD-10-CM   1. 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 Retinal vasculitis, bilateral  H35.063 Color Fundus Photography Optos - OU - Both Eyes  3. Secondary corneal edema of right eye  H18.231     1.  No active retinal vascular disease in either eye at this time.  OD with macular atrophy longstanding, and stable now for many years.O  2.  OS, prior CME, with residual change nasal to the fovea which is not active and has been stable off of antivegF  now for some time.  Acute changes.  3.  The patient describes visual symptoms of a haze or sometimes even coloration to pictures or or paper he is reading to visit daily living.  Typically these last up to about a minute.  They seem to mostly left eye but they can be both eyes.  Symptomatology to occur in this fashion is mostly likely systemic vascular in nature and not likely from retinal origin.  Variations in in color perception may occur with variations in blood pressures mostly with drops of blood pressure.  Moreover with the use of psychoactive medications for the treatment of Parkinson's be experiencing some minor side effects but I  would not in any way halt the use of the L-dopa carbidopa medication  Ophthalmic Meds Ordered this visit:  No orders of the defined types were placed in this encounter.      Return in about 3 months (around 06/04/2020) for COLOR FP, OCT, DILATE OU.  Patient Instructions  I reassured the patient that the symptoms are most likely systemic and/or CNS biological side effects of the medications he is using for psychoactive treatment of Parkinson's disease.  I reassured the patient that there are no actual deficits in the retina that could lead to this type of symptomatology.  I informed the patient these are most likely psychoactive side effects of the medications he is using however should new visual symptoms develop we would like to see him again at any time    Explained the diagnoses, plan, and follow up with the patient and they expressed understanding.  Patient expressed understanding of the importance of proper follow up care.   Clent Demark Stace Peace M.D. Diseases & Surgery of the Retina and Vitreous Retina & Diabetic [place] 03/04/20     Abbreviations: M myopia (nearsighted); A astigmatism; H hyperopia (farsighted); P presbyopia; Mrx spectacle prescription;  CTL contact lenses; OD right eye; OS left eye; OU both eyes  XT exotropia; ET esotropia; PEK punctate epithelial keratitis; PEE punctate epithelial erosions; DES dry eye syndrome; MGD meibomian gland dysfunction; ATs artificial tears; PFAT's preservative free artificial tears; [place] nuclear sclerotic cataract; PSC posterior subcapsular cataract; ERM epi-retinal membrane; PVD posterior vitreous detachment; RD retinal detachment; DM diabetes mellitus; DR diabetic retinopathy; NPDR non-proliferative diabetic retinopathy; PDR proliferative diabetic retinopathy; CSME clinically significant macular edema; DME diabetic macular edema; dbh dot blot hemorrhages; CWS cotton wool spot; POAG primary open angle glaucoma; C/D cup-to-disc ratio; HVF  humphrey visual field; GVF goldmann visual field; OCT optical coherence tomography; IOP intraocular pressure; BRVO Branch retinal vein occlusion; CRVO central retinal vein occlusion; CRAO central retinal artery occlusion; BRAO branch retinal artery occlusion; RT retinal tear; SB scleral buckle; PPV pars plana vitrectomy; VH Vitreous hemorrhage; PRP panretinal laser photocoagulation; IVK intravitreal kenalog; VMT vitreomacular traction; MH Macular hole;  NVD neovascularization of the disc; NVE neovascularization elsewhere; AREDS age related eye disease study; ARMD age related macular degeneration; POAG primary open angle glaucoma; EBMD epithelial/anterior basement membrane dystrophy; ACIOL anterior chamber intraocular lens; IOL intraocular lens; PCIOL posterior chamber intraocular lens; Phaco/IOL phacoemulsification with intraocular lens placement; [place] photorefractive keratectomy; LASIK laser assisted in situ keratomileusis; HTN hypertension; DM diabetes mellitus; COPD chronic obstructive pulmonary disease

## 2020-03-04 NOTE — Patient Instructions (Signed)
I reassured the patient that the symptoms are most likely systemic and/or CNS biological side effects of the medications he is using for psychoactive treatment of Parkinson's disease.  I reassured the patient that there are no actual deficits in the retina that could lead to this type of symptomatology.  I informed the patient these are most likely psychoactive side effects of the medications he is using however should new visual symptoms develop we would like to see him again at any time

## 2020-03-04 NOTE — Assessment & Plan Note (Signed)
This condition OU is has been quiescent and stable now for years, previously hepatitis C related and now involutional cure of hepatitis C

## 2020-03-04 NOTE — Assessment & Plan Note (Signed)
Treated in the past OS with anticoagulant materials, stable, no active CME.

## 2020-03-05 ENCOUNTER — Ambulatory Visit: Payer: Self-pay | Admitting: Family Medicine

## 2020-03-11 ENCOUNTER — Other Ambulatory Visit: Payer: Self-pay | Admitting: Family Medicine

## 2020-03-11 ENCOUNTER — Encounter: Payer: Self-pay | Admitting: Cardiology

## 2020-03-11 ENCOUNTER — Ambulatory Visit: Payer: 59 | Admitting: Cardiology

## 2020-03-11 ENCOUNTER — Other Ambulatory Visit: Payer: Self-pay

## 2020-03-11 VITALS — BP 132/76 | HR 72 | Ht 70.5 in | Wt 160.0 lb

## 2020-03-11 DIAGNOSIS — Z8249 Family history of ischemic heart disease and other diseases of the circulatory system: Secondary | ICD-10-CM | POA: Diagnosis not present

## 2020-03-11 DIAGNOSIS — F329 Major depressive disorder, single episode, unspecified: Secondary | ICD-10-CM

## 2020-03-11 DIAGNOSIS — I10 Essential (primary) hypertension: Secondary | ICD-10-CM

## 2020-03-11 DIAGNOSIS — I700 Atherosclerosis of aorta: Secondary | ICD-10-CM

## 2020-03-11 DIAGNOSIS — E785 Hyperlipidemia, unspecified: Secondary | ICD-10-CM

## 2020-03-11 DIAGNOSIS — F32A Depression, unspecified: Secondary | ICD-10-CM

## 2020-03-11 MED FILL — CITALOPRAM HBR 20 MG TABLET: 20 | 30 days supply | Qty: 30 | Fill #0

## 2020-03-11 NOTE — Progress Notes (Signed)
Primary Care Provider: Denita Lung, MD Cardiologist: Glenetta Hew, MD Electrophysiologist: None  Clinic Note: Chief Complaint  Patient presents with  . Follow-up    Annual  . Chest Pain    Worse with eating.   HPI:    [name] is a 69 y.o. male with a PMH Parkinson's disease along with multiple cardiac risk factors including HTN, HLD and family history with mother having coronary disease who presents today for delayed annual follow-up.  He is a former patient initially of Dr. Myrtice Lauth followed by Dr. Rollene Fare.  He was being followed for his cardiac risk factors.  He had some chest discomfort back in 2005 evaluated with cardiac catheterization showing minimal CAD.  Negative Myoview in 2011. ->   [name] was last seen on November 14, 2018 via telehealth -he was doing pretty well up until about 3 weeks ago when he started having some chest discomfort.  He noted this is usually associated with eating but it is resolved with nitroglycerin.  Sometimes happens late at night when he lies down.  He says been going every now and then may be 2 or 3 times for the last 6 months.  It can happen with about lying down.  Not associate with exertion.  Worse with eating certain things like ice cream.  Discussed probably need to recheck abdominal aorta within the next year or 2.  Chest pain thought to be more related to GI.  Allow for mild permissive hypertension but increase irbesartan to 300 mg.--Take it after breakfast  --> He was seen by Dr.Schumann as a walk-in patient on 10/27/2019 for preop evaluation.  He was noted to be on a walk his dog 2 times a day for least 10 to 15 minutes at a time.  No exertional symptoms.  Able to walk up 2 flights of steps without stopping.  (Easily> 4 METS). -->  RCRI score of 0 corresponding roughly 4% 30-day risk.  Recommended proceeding with surgery with no further cardiac valuation.  Recent Hospitalizations:   11/02/2019 right foot bunion  surgery  Seen by Dr. 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  Thought to be related to hepatitis C.  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  He has been following up with Dr. Cristina Gong for his episodic chest pain episodes as well.  Reviewed  CV studies:    The following studies were reviewed today: (if available, images/films reviewed: From Epic Chart or Care Everywhere) . None:   Interval History:   [name] returns today overall pretty stable.  He said he can go weeks to about a month and do fine, then he will have exacerbations will have once or twice a week episodes of this discomfort in the epigastric area and lower chest.  It often is associated with after eating and/or lying down.  Episodes can last about a minute or 2 and they usually resolve.  But they cannot come and go all day long of the time.  He sometimes notices it when he takes deep breath, but not really with exertion.  Usually warm water can calm it down.  CV Review of Symptoms (Summary) Cardiovascular ROS: positive for - chest pain and Unusual symptoms as noted above. negative for - dyspnea on exertion, edema, irregular heartbeat, orthopnea, palpitations, paroxysmal nocturnal dyspnea, rapid heart rate, shortness of breath or Syncope/near syncope, TIA/amaurosis fugax, claudication  The patient does not have symptoms concerning  for COVID-19 infection (fever, chills, cough, or new shortness of breath).  The patient is practicing social distancing & Masking.  ++ Covid 19 vaccines  REVIEWED OF SYSTEMS   Review of Systems  Constitutional: Negative for malaise/fatigue and weight loss.  HENT: Negative for nosebleeds.   Respiratory: Negative for shortness of breath.   Cardiovascular: Positive for chest pain.  Gastrointestinal: Positive for abdominal pain and heartburn. Negative for blood in stool, constipation and melena.  Genitourinary: Negative  for hematuria.  Musculoskeletal: Negative for joint pain (Chest mild arthralgias; he had a right foot surgery and that is healing up but sore).  Neurological: Positive for dizziness (Sometimes positional.  Sometimes just poor balance), tremors and weakness (Has some generalized weakness from Parkinson's.).       Somewhat slowed shuffling gait  Psychiatric/Behavioral: Negative for depression and memory loss. The patient is not nervous/anxious and does not have insomnia.   .   I have reviewed and (if needed) personally updated the patient's problem list, medications, allergies, past medical and surgical history, social and family history.   PAST MEDICAL HISTORY   Past Medical History:  Diagnosis Date  . Allergy   . Arthritis   . BPH (benign prostatic hyperplasia)   . Complication of anesthesia    Pt. stated he had a reaction that ended in him requiring urinary cath placement  . Diverticulosis   . GERD (gastroesophageal reflux disease)    esophageal spasms  . Glaucoma   . Gout   . Head injury, closed, with concussion   . Hepatitis C    chronic - Has been treated with Harvoni  . HLD (hyperlipidemia)    statin intolerant (Crestor & Simvastatin) - Taking Livalo 1mg  / week  . Hypertension   . Parkinson's disease ([place])   . Plantar fasciitis    right  . PVD (peripheral vascular disease) ([place])    With no claudication; only mild abdominal aortic atherosclerosis noted on ultrasound.  . Thoracic ascending aortic aneurysm (HCC)    4.2 cm ascending TAA 09/2016 CT, 1 yr f/u rec  . Ulcer     PAST SURGICAL HISTORY   Past Surgical History:  Procedure Laterality Date  . BUNIONECTOMY WITH WEIL OSTEOTOMY Right 11/02/2019   Procedure: Right Foot Lapidus, Modified McBride Bunionectomy,  Hallux Akin Osteotomy;  Surgeon: Wylene Simmer, MD;  Location: [facility];  Service: Orthopedics;  Laterality: Right;  . CARDIAC CATHETERIZATION  2005   30% Cx. Dr. Melvern Banker  . cataract surgery Left  11/05/2014  . COLONOSCOPY    . ESOPHAGOGASTRODUODENOSCOPY N/A 05/02/2015   Procedure: ESOPHAGOGASTRODUODENOSCOPY (EGD);  Surgeon: Ronald Lobo, MD;  Location: [facility];  Service: Endoscopy;  Laterality: N/A;  . ESOPHAGOGASTRODUODENOSCOPY  05/02/2015   no source of pt chest pain endoscopically evident. small hiatal hernia.  [name] EYE SURGERY    . HARDWARE REMOVAL Right 11/02/2019   Procedure: Second Metatarsal Removal of Deep Implant and Rotational Osteotomy;  Surgeon: Wylene Simmer, MD;  Location: [facility];  Service: Orthopedics;  Laterality: Right;  . MEMBRANE PEEL Left 03/14/2014   Procedure: MEMBRANE PEEL; ENDOLASER;  Surgeon: Hurman Horn, MD;  Location: [facility];  Service: Ophthalmology;  Laterality: Left;  . NM MYOVIEW LTD  2011   Neg Ischemia or infarct.  [name] NM MYOVIEW LTD  10/2015   LOW RISK. Small, fixed basal lateral defect - likely diaphragmatic attenuation. EF 69%  . PARS PLANA VITRECTOMY Left 03/14/2014   Procedure: PARS PLANA VITRECTOMY WITH 25 GAUGE;  Surgeon: Hurman Horn, MD;  Location: [facility];  Service: Ophthalmology;  Laterality: Left;  . TONSILLECTOMY    . TRANSTHORACIC ECHOCARDIOGRAM  2013   Normal EF. No significant Valve Disease  . UPPER GASTROINTESTINAL ENDOSCOPY    . WEIL OSTEOTOMY Right 09/01/2017   Procedure: RIGHT GREAT TOE CHEVRON AND WEIL OSTEOTOMY 2ND METATARSAL;  Surgeon: Newt Minion, MD;  Location: [facility];  Service: Orthopedics;  Laterality: Right;    MEDICATIONS/ALLERGIES   Current Meds  Medication Sig  . Acetaminophen (ACETAMIN PO) Take 500 mg by mouth every 4 (four) hours as needed (pain).   [name] alfuzosin (UROXATRAL) 10 MG 24 hr tablet Take 10 mg by mouth at bedtime.   [name] aspirin EC 81 MG tablet Take 1 tablet (81 mg total) by mouth 2 (two) times daily.  . calcium-vitamin D (OSCAL WITH D) 250-125 MG-UNIT tablet Take 1 tablet by mouth daily.  . carbidopa-levodopa (SINEMET IR) 25-100 MG tablet Take 1.5 tablets by mouth 3 (three) times daily.  .  citalopram (CELEXA) 20 MG tablet TAKE 1 TABLET BY MOUTH ONCE DAILY  . citalopram (CELEXA) 40 MG tablet Take 1 tablet (40 mg total) by mouth daily.  [name] docusate sodium (COLACE) 100 MG capsule Take 1 capsule (100 mg total) by mouth daily as needed.  [name] LIVALO 1 MG TABS TAKE 1 TABLET (1 MG TOTAL) BY MOUTH 2 TIMES A WEEK. TAKES ON MONDAY AND FRIDAY  . Multiple Vitamin (MULTIVITAMIN WITH MINERALS) TABS tablet Take 1 tablet by mouth 4 (four) times a week.   . nitroGLYCERIN (NITROSTAT) 0.4 MG SL tablet PLACE 1 TABLET UNDER THE TONGUE EVERY 5 MINUTES AS NEEDED FOR CHEST PAIN.  [name] omega-3 acid ethyl esters (LOVAZA) 1 g capsule Take by mouth 2 (two) times daily.  . pantoprazole (PROTONIX) 40 MG tablet Take 40 mg by mouth 2 (two) times daily.   [name] senna (SENOKOT) 8.6 MG TABS tablet Take 2 tablets (17.2 mg total) by mouth 2 (two) times daily.  . timolol (TIMOPTIC) 0.5 % ophthalmic solution Place 1 drop into the left eye daily.   . trihexyphenidyl (ARTANE) 2 MG tablet TAKE 1/2 TABLET BY MOUTH TWO TIMES DAILY WITH MEALS  . verapamil (CALAN-SR) 240 MG CR tablet TAKE 1 TABLET (240 MG TOTAL) BY MOUTH AT BEDTIME.  . vitamin B-12 (CYANOCOBALAMIN) 1000 MCG tablet Take 1,000 mcg by mouth 3 (three) times a week.   . [DISCONTINUED] irbesartan (AVAPRO) 300 MG tablet TAKE 1 TABLET (300 MG TOTAL) BY MOUTH EVERY MORNING.    No Known Allergies  SOCIAL HISTORY/FAMILY HISTORY   Reviewed in Epic:  Pertinent findings: n/a  OBJCTIVE -PE, EKG, labs   Wt Readings from Last 3 Encounters:  03/11/20 160 lb (72.6 kg)  01/17/20 158 lb 9.6 oz (71.9 kg)  01/10/20 161 lb (73 kg)    Physical Exam: BP 132/76   Pulse 72   Ht 5' 10.5" (1.791 m)   Wt 160 lb (72.6 kg)   BMI 22.63 kg/m  Physical Exam Constitutional:      General: He is not in acute distress.    Appearance: Normal appearance. He is normal weight. He is not ill-appearing.  HENT:     Head: Normocephalic and atraumatic.  Neck:     Vascular: No carotid bruit,  hepatojugular reflux or JVD.     Comments: A little bit rigid from Parkinson's. Cardiovascular:     Rate and Rhythm: Normal rate and regular rhythm.  No extrasystoles are present.  Pulses: Normal pulses.     Heart sounds: Murmur (~2/6 SEM at RUSB.) heard.  No friction rub. No gallop.   Pulmonary:     Effort: Pulmonary effort is normal. No respiratory distress.     Breath sounds: Normal breath sounds.  Chest:     Chest wall: No tenderness.  Abdominal:     General: Abdomen is flat. There is no distension.     Palpations: Abdomen is soft. There is no mass (No HSM).     Tenderness: There is no abdominal tenderness.  Musculoskeletal:        General: Swelling (Trivial 1+ bilateral LE) present. Normal range of motion.     Cervical back: Normal range of motion.  Neurological:     General: No focal deficit present.     Mental Status: He is alert and oriented to person, place, and time.  Psychiatric:        Mood and Affect: Mood normal.        Behavior: Behavior normal.        Thought Content: Thought content normal.        Judgment: Judgment normal.     Adult ECG Report Not checked  Recent Labs:   Lab Results  Component Value Date   CHOL 148 11/29/2019   HDL 53 11/29/2019   LDLCALC 84 11/29/2019   TRIG 54 11/29/2019   CHOLHDL 2.8 11/29/2019   Lab Results  Component Value Date   CREATININE 1.38 (H) 11/29/2019   BUN 23 11/29/2019   NA 138 11/29/2019   K 4.4 11/29/2019   CL 102 11/29/2019   CO2 25 11/29/2019   Lab Results  Component Value Date   TSH 1.950 09/28/2016    ASSESSMENT/PLAN    Problem List Items Addressed This Visit    Hyperlipidemia with target LDL less than 100 (Chronic)    Seems to be tolerating Livalo.  Most recent panel showed LDL of 84 which is pretty much at goal for him.      Abdominal aortic atherosclerosis (HCC) (Chronic)    He has had several procedures this year.  We will hold off until next year to reassess abdominal aortic ultrasound.   Continue blood pressure and lipid control.      Moderate essential hypertension - Primary (Chronic)    Blood pressure looks pretty good today on irbesartan 300 mg and verapamil.  I recommended that he takes his irbesartan after breakfast to avoid orthostatic hypotension.  Verapamil at night.      Family history of heart disease in male family member before age 47 (Chronic)    Has had nonischemic cardiac evaluations.  Continue blood pressure and lipid control.  Diabetes control by PCP.          COVID-19 Education: The signs and symptoms of COVID-19 were discussed with the patient and how to seek care for testing (follow up with PCP or arrange E-visit).   The importance of social distancing and COVID-19 vaccination was discussed today.  I spent a total of 21 minutes with the patient in direct patient consultation.  Additional time spent with chart review  / charting (studies, outside notes, etc): 10 Total Time: 80min   Current medicines are reviewed at length with the patient today.  (+/- concerns) n/a  Notice: This dictation was prepared with Dragon dictation along with smaller phrase technology. Any transcriptional errors that result from this process are unintentional and may not be corrected upon review.  Patient Instructions / Medication Changes &  Studies & Tests Ordered   Patient Instructions  Medication Instructions:  The current medical regimen is effective;  continue present plan and medications.  *If you need a refill on your cardiac medications before your next appointment, please call your pharmacy*   Follow-Up: At [facility], you and your health needs are our priority.  As part of our continuing mission to provide you with exceptional heart care, we have created designated Provider Care Teams.  These Care Teams include your primary Cardiologist (physician) and Advanced Practice Providers (APPs -  Physician Assistants and Nurse Practitioners) who all work  together to provide you with the care you need, when you need it.  We recommend signing up for the patient portal called "MyChart".  Sign up information is provided on this After Visit Summary.  MyChart is used to connect with patients for Virtual Visits (Telemedicine).  Patients are able to view lab/test results, encounter notes, upcoming appointments, etc.  Non-urgent messages can be sent to your provider as well.   To learn more about what you can do with MyChart, go to [email or web address].    Your next appointment:   12 month(s)  The format for your next appointment:   In Person  Provider:   Glenetta Hew, MD    Studies Ordered:   No orders of the defined types were placed in this encounter.    Glenetta Hew, M.D., M.S. Interventional Cardiologist   Pager # [phone] Phone # [phone] [address] [place], [place] [address]   Thank you for choosing Heartcare at [facility]!!

## 2020-03-11 NOTE — Telephone Encounter (Signed)
[name] is requesting to fill pt celexa. Look as if it has been d/c. Please advise [facility]

## 2020-03-11 NOTE — Patient Instructions (Addendum)
Medication Instructions:  The current medical regimen is effective;  continue present plan and medications.  *If you need a refill on your cardiac medications before your next appointment, please call your pharmacy*   Follow-Up: At [facility], you and your health needs are our priority.  As part of our continuing mission to provide you with exceptional heart care, we have created designated Provider Care Teams.  These Care Teams include your primary Cardiologist (physician) and Advanced Practice Providers (APPs -  Physician Assistants and Nurse Practitioners) who all work together to provide you with the care you need, when you need it.  We recommend signing up for the patient portal called "MyChart".  Sign up information is provided on this After Visit Summary.  MyChart is used to connect with patients for Virtual Visits (Telemedicine).  Patients are able to view lab/test results, encounter notes, upcoming appointments, etc.  Non-urgent messages can be sent to your provider as well.   To learn more about what you can do with MyChart, go to [email or web address].    Your next appointment:   12 month(s)  The format for your next appointment:   In Person  Provider:   Glenetta Hew, MD

## 2020-03-14 ENCOUNTER — Other Ambulatory Visit: Payer: Self-pay | Admitting: Cardiology

## 2020-03-14 MED FILL — IRBESARTAN 300 MG TAB: 300 | 30 days supply | Qty: 30 | Fill #0

## 2020-03-20 ENCOUNTER — Encounter: Payer: Self-pay | Admitting: Cardiology

## 2020-03-20 ENCOUNTER — Ambulatory Visit: Payer: 59 | Admitting: Family Medicine

## 2020-03-20 ENCOUNTER — Other Ambulatory Visit: Payer: Self-pay

## 2020-03-20 VITALS — BP 120/82 | HR 69 | Temp 97.6°F | Wt 160.2 lb

## 2020-03-20 DIAGNOSIS — Z23 Encounter for immunization: Secondary | ICD-10-CM

## 2020-03-20 NOTE — Assessment & Plan Note (Signed)
Seems to be tolerating Livalo.  Most recent panel showed LDL of 84 which is pretty much at goal for him.

## 2020-03-20 NOTE — Assessment & Plan Note (Signed)
Blood pressure looks pretty good today on irbesartan 300 mg and verapamil.  I recommended that he takes his irbesartan after breakfast to avoid orthostatic hypotension.  Verapamil at night.

## 2020-03-20 NOTE — Assessment & Plan Note (Signed)
He has had several procedures this year.  We will hold off until next year to reassess abdominal aortic ultrasound.  Continue blood pressure and lipid control.

## 2020-03-20 NOTE — Assessment & Plan Note (Signed)
Has had nonischemic cardiac evaluations.  Continue blood pressure and lipid control.  Diabetes control by PCP.

## 2020-03-20 NOTE — Progress Notes (Signed)
   Subjective:    Patient ID: [name], male    DOB: 1951-04-14, 69 y.o.   MRN: [id number]  HPI He is here for a recheck.  He does need his second shingles shot.  He also continues have difficulty with intermittent constipation but has not been using the MiraLAX on a regular basis.  He has concerns over possible B12 because of fatigue.   Review of Systems     Objective:   Physical Exam Alert and in no distress.  Exam of his record shows that there are no markers for possible B12 deficiency.      Assessment & Plan:  Need for shingles vaccine - Plan: Varicella-zoster vaccine IM Reinforced the need for him to use MiraLAX on a regular basis.

## 2020-03-26 DIAGNOSIS — R350 Frequency of micturition: Secondary | ICD-10-CM | POA: Diagnosis not present

## 2020-03-26 DIAGNOSIS — R351 Nocturia: Secondary | ICD-10-CM | POA: Diagnosis not present

## 2020-03-26 MED FILL — SOLIFENACIN SUCCINATE 5 MG: 5 | 30 days supply | Qty: 30 | Fill #0

## 2020-04-05 MED FILL — LIVALO 1 MG TABLET: 1 | 28 days supply | Qty: 8 | Fill #4

## 2020-04-05 MED FILL — TIMOLOL MALEATE 0.5 % SOLN: 0.5 | 90 days supply | Qty: 10 | Fill #2

## 2020-04-12 MED FILL — IRBESARTAN 300 MG TAB: 300 | 30 days supply | Qty: 30 | Fill #1

## 2020-04-12 MED FILL — CITALOPRAM HBR 20 MG TABLET: 20 | 30 days supply | Qty: 30 | Fill #1

## 2020-04-24 ENCOUNTER — Other Ambulatory Visit: Payer: Self-pay | Admitting: Orthopedic Surgery

## 2020-04-24 DIAGNOSIS — M79671 Pain in right foot: Secondary | ICD-10-CM

## 2020-04-26 MED FILL — TRIHEXYPHENIDYL 2 MG TABLET: 2 | 30 days supply | Qty: 30 | Fill #1

## 2020-04-30 MED FILL — LIVALO 1 MG TABLET: 1 | 28 days supply | Qty: 8 | Fill #5

## 2020-05-02 ENCOUNTER — Telehealth: Payer: Self-pay | Admitting: Family Medicine

## 2020-05-02 NOTE — Telephone Encounter (Signed)
Wife called & pt would like Moderna booster, is this ok, 2nd vaccine was 2/21

## 2020-05-02 NOTE — Telephone Encounter (Signed)
Its not approved yet

## 2020-05-03 ENCOUNTER — Ambulatory Visit
Admission: RE | Admit: 2020-05-03 | Discharge: 2020-05-03 | Disposition: A | Discharge status: Home / Self Care | Payer: 59 | Source: Ambulatory Visit | Attending: Orthopedic Surgery | Admitting: Orthopedic Surgery

## 2020-05-03 DIAGNOSIS — Z9889 Other specified postprocedural states: Secondary | ICD-10-CM | POA: Diagnosis not present

## 2020-05-03 DIAGNOSIS — M79671 Pain in right foot: Secondary | ICD-10-CM

## 2020-05-03 DIAGNOSIS — Z981 Arthrodesis status: Secondary | ICD-10-CM | POA: Diagnosis not present

## 2020-05-03 DIAGNOSIS — M19071 Primary osteoarthritis, right ankle and foot: Secondary | ICD-10-CM | POA: Diagnosis not present

## 2020-05-03 DIAGNOSIS — Q742 Other congenital malformations of lower limb(s), including pelvic girdle: Secondary | ICD-10-CM | POA: Diagnosis not present

## 2020-05-07 ENCOUNTER — Telehealth: Payer: Self-pay | Admitting: Family Medicine

## 2020-05-07 NOTE — Telephone Encounter (Signed)
Wife informed Booster not approved at this time, Welcome to Medicare scheduled for 2/22

## 2020-05-07 NOTE — Telephone Encounter (Signed)
Wife informed

## 2020-05-09 MED FILL — VERAPAMIL HCL ER 240 MG TBC: 240 | 90 days supply | Qty: 90 | Fill #1

## 2020-05-09 MED FILL — CITALOPRAM HBR 20 MG TABLET: 20 | 30 days supply | Qty: 30 | Fill #2

## 2020-05-09 MED FILL — IRBESARTAN 300 MG TAB: 300 | 30 days supply | Qty: 30 | Fill #2

## 2020-05-17 ENCOUNTER — Ambulatory Visit
Admission: RE | Admit: 2020-05-17 | Discharge: 2020-05-17 | Disposition: A | Discharge status: Home / Self Care | Payer: Medicare Other | Source: Ambulatory Visit | Attending: Gastroenterology | Admitting: Gastroenterology

## 2020-05-17 DIAGNOSIS — K224 Dyskinesia of esophagus: Secondary | ICD-10-CM | POA: Diagnosis not present

## 2020-05-17 DIAGNOSIS — R079 Chest pain, unspecified: Secondary | ICD-10-CM

## 2020-05-20 ENCOUNTER — Telehealth: Payer: Self-pay | Admitting: Family Medicine

## 2020-05-20 MED FILL — CARBIDOPA-LEVODOPA 25-100 T: 25-100 | 90 days supply | Qty: 405 | Fill #1

## 2020-05-20 NOTE — Telephone Encounter (Signed)
Pt called and wants to get a Engineer, technical sales. Please advise at [phone].

## 2020-05-20 NOTE — Telephone Encounter (Signed)
ok 

## 2020-05-20 NOTE — Telephone Encounter (Signed)
Appt was made. KH 

## 2020-05-22 ENCOUNTER — Other Ambulatory Visit: Payer: Self-pay

## 2020-05-22 ENCOUNTER — Ambulatory Visit (INDEPENDENT_AMBULATORY_CARE_PROVIDER_SITE_OTHER): Payer: Medicare Other | Admitting: Family Medicine

## 2020-05-22 ENCOUNTER — Other Ambulatory Visit: Payer: Self-pay | Admitting: Physician Assistant

## 2020-05-22 ENCOUNTER — Ambulatory Visit: Payer: Medicare Other

## 2020-05-22 ENCOUNTER — Ambulatory Visit (INDEPENDENT_AMBULATORY_CARE_PROVIDER_SITE_OTHER): Payer: Medicare Other

## 2020-05-22 ENCOUNTER — Encounter: Payer: Self-pay | Admitting: Family Medicine

## 2020-05-22 VITALS — BP 120/70 | HR 80 | Temp 97.9°F | Wt 162.2 lb

## 2020-05-22 DIAGNOSIS — M48062 Spinal stenosis, lumbar region with neurogenic claudication: Secondary | ICD-10-CM | POA: Diagnosis not present

## 2020-05-22 DIAGNOSIS — M5442 Lumbago with sciatica, left side: Secondary | ICD-10-CM | POA: Diagnosis not present

## 2020-05-22 DIAGNOSIS — M5441 Lumbago with sciatica, right side: Secondary | ICD-10-CM

## 2020-05-22 DIAGNOSIS — Z23 Encounter for immunization: Secondary | ICD-10-CM | POA: Diagnosis not present

## 2020-05-22 DIAGNOSIS — G8929 Other chronic pain: Secondary | ICD-10-CM | POA: Diagnosis not present

## 2020-05-22 NOTE — Progress Notes (Signed)
   Subjective:    Patient ID: [name], male    DOB: 1950-10-31, 69 y.o.   MRN: [id number]  HPI Chief Complaint  Patient presents with  . back pain    back pain x1 week, when waking up he can hardly walk, if he overdoes anything it will flare up   Complains of a 1 week history of worsening low back pain. States he was lifting some heavy boxes in his office in [facility] a day or two before the pain flared up. States pain is worse when he first gets out of bed. States pain is a pulling sensation and occasionally radiates down his upper thighs bilaterally.  Pain improves as he is moving and throughout the day until he sits for periods of time. No numbness, tingling or weakness.   He is under the care of a back specialist. Last there in March and had an injection in his lower spine.  States he called them yesterday and this morning and is awaiting a call back.   Taking Tylenol 1,000 mg two times daily and using a heating pad. He has also used [employer] patches.   Denies fever, chills, dizziness, chest pain, palpitations, shortness of breath, abdominal pain, N/V/D, urinary symptoms, LE edema.    Review of Systems Pertinent positives and negatives in the history of present illness.     Objective:   Physical Exam Constitutional:      Appearance: Normal appearance.  Cardiovascular:     Rate and Rhythm: Normal rate and regular rhythm.     Pulses: Normal pulses.     Heart sounds: Normal heart sounds.  Pulmonary:     Effort: Pulmonary effort is normal.     Breath sounds: Normal breath sounds.  Musculoskeletal:     Cervical back: Normal, normal range of motion and neck supple.     Thoracic back: Normal.     Lumbar back: Tenderness present. Negative right straight leg raise test and negative left straight leg raise test.     Comments: Tenderness with palpation to his lumbar midline and paraspinal muscles. Mild TTP over left sciatic notch. He has good ROM of his back in office.    Skin:    General: Skin is warm and dry.     Capillary Refill: Capillary refill takes less than 2 seconds.  Neurological:     General: No focal deficit present.     Mental Status: He is alert and oriented to person, place, and time.     Cranial Nerves: No cranial nerve deficit.     Sensory: No sensory deficit.     Motor: No weakness.     Coordination: Coordination normal.     Gait: Gait normal.     Deep Tendon Reflexes: Reflexes normal.    BP 120/70   Pulse 80   Temp 97.9 F (36.6 C)   Wt 162 lb 3.2 oz (73.6 kg)   BMI 22.94 kg/m       Assessment & Plan:  Chronic bilateral low back pain with bilateral sciatica  Spinal stenosis of lumbar region with neurogenic claudication  No red flag symptoms.  Gabriel Cirri, CMA called and he will hear from [facility] to schedule his a LS injection per Dr. Cleotilde Neer office. He will continue with conservative treatment including [place] [employer] patches, heating pad, Tylenol 1,000 mg bid and offered additional pain medication if needed.  Addendum: he appears to have an appt tomorrow at GI for injection.

## 2020-05-23 ENCOUNTER — Ambulatory Visit
Admission: RE | Admit: 2020-05-23 | Discharge: 2020-05-23 | Disposition: A | Discharge status: Home / Self Care | Payer: Medicare Other | Source: Ambulatory Visit | Attending: Physician Assistant | Admitting: Physician Assistant

## 2020-05-23 ENCOUNTER — Other Ambulatory Visit: Payer: Self-pay

## 2020-05-23 DIAGNOSIS — M48062 Spinal stenosis, lumbar region with neurogenic claudication: Secondary | ICD-10-CM | POA: Diagnosis not present

## 2020-05-23 MED ORDER — METHYLPREDNISOLONE ACETATE 40 MG/ML INJ SUSP (RADIOLOG
120.0000 mg | Freq: Once | INTRAMUSCULAR | Status: AC
  Administered 2020-05-23: 120 mg via EPIDURAL

## 2020-05-23 MED ORDER — IOPAMIDOL (ISOVUE-M 200) INJECTION 41%
1.0000 mL | Freq: Once | INTRAMUSCULAR | Status: AC
  Administered 2020-05-23: 1 mL via EPIDURAL

## 2020-05-23 NOTE — Discharge Instructions (Signed)

## 2020-05-29 MED FILL — ALFUZOSIN HCL ER 10 MG TAB: 10 | 90 days supply | Qty: 90 | Fill #1

## 2020-06-07 DIAGNOSIS — S92301K Fracture of unspecified metatarsal bone(s), right foot, subsequent encounter for fracture with nonunion: Secondary | ICD-10-CM | POA: Diagnosis not present

## 2020-06-10 ENCOUNTER — Telehealth: Payer: Self-pay

## 2020-06-10 ENCOUNTER — Other Ambulatory Visit: Payer: Self-pay | Admitting: Neurosurgery

## 2020-06-10 ENCOUNTER — Other Ambulatory Visit: Payer: Self-pay | Admitting: Family Medicine

## 2020-06-10 DIAGNOSIS — M48062 Spinal stenosis, lumbar region with neurogenic claudication: Secondary | ICD-10-CM

## 2020-06-10 DIAGNOSIS — F32A Depression, unspecified: Secondary | ICD-10-CM

## 2020-06-10 MED FILL — IRBESARTAN 300 MG TABS: 300 | 30 days supply | Qty: 30 | Fill #3

## 2020-06-10 NOTE — Telephone Encounter (Signed)
Received fax from pharmacy for a refill on the pts. Citalopram 20 mg pt. Last apt was 05/22/20 and next apt. Is 09/10/20.

## 2020-06-10 NOTE — Telephone Encounter (Signed)
[facility] is requesting to fill pt celexa. Please advise [facility]

## 2020-06-11 ENCOUNTER — Other Ambulatory Visit: Payer: Self-pay | Admitting: Medical

## 2020-06-11 DIAGNOSIS — F32A Depression, unspecified: Secondary | ICD-10-CM

## 2020-06-11 MED ORDER — CITALOPRAM HYDROBROMIDE 20 MG PO TABS: 20.0000 mg | ORAL_TABLET | Freq: Every day | ORAL | 0 refills | Status: DC

## 2020-06-11 MED FILL — CITALOPRAM HBR 20 MG TABLET: 20 | 30 days supply | Qty: 30 | Fill #0

## 2020-06-12 ENCOUNTER — Ambulatory Visit
Admission: RE | Admit: 2020-06-12 | Discharge: 2020-06-12 | Disposition: A | Discharge status: Home / Self Care | Payer: Medicare Other | Source: Ambulatory Visit | Attending: Neurosurgery | Admitting: Neurosurgery

## 2020-06-12 ENCOUNTER — Other Ambulatory Visit: Payer: Self-pay

## 2020-06-12 DIAGNOSIS — M47817 Spondylosis without myelopathy or radiculopathy, lumbosacral region: Secondary | ICD-10-CM | POA: Diagnosis not present

## 2020-06-12 DIAGNOSIS — M48062 Spinal stenosis, lumbar region with neurogenic claudication: Secondary | ICD-10-CM

## 2020-06-12 MED ORDER — IOPAMIDOL (ISOVUE-M 200) INJECTION 41%
1.0000 mL | Freq: Once | INTRAMUSCULAR | Status: AC
  Administered 2020-06-12: 1 mL via EPIDURAL

## 2020-06-12 MED ORDER — METHYLPREDNISOLONE ACETATE 40 MG/ML INJ SUSP (RADIOLOG
120.0000 mg | Freq: Once | INTRAMUSCULAR | Status: AC
  Administered 2020-06-12: 120 mg via EPIDURAL

## 2020-06-12 NOTE — Discharge Instructions (Signed)

## 2020-06-13 ENCOUNTER — Ambulatory Visit (INDEPENDENT_AMBULATORY_CARE_PROVIDER_SITE_OTHER): Payer: Medicare Other | Admitting: Ophthalmology

## 2020-06-13 ENCOUNTER — Encounter (INDEPENDENT_AMBULATORY_CARE_PROVIDER_SITE_OTHER): Payer: Self-pay | Admitting: Ophthalmology

## 2020-06-13 DIAGNOSIS — H34812 Central retinal vein occlusion, left eye, with macular edema: Secondary | ICD-10-CM

## 2020-06-13 DIAGNOSIS — H35063 Retinal vasculitis, bilateral: Secondary | ICD-10-CM

## 2020-06-13 NOTE — Progress Notes (Signed)
06/13/2020     CHIEF COMPLAINT Patient presents for Retina Follow Up   HISTORY OF PRESENT ILLNESS: [name] is a 69 y.o. male who presents to the clinic today for:   HPI    Retina Follow Up    Patient presents with  Other.  In both eyes.  This started 3 months ago.  Severity is mild.  Duration of 3 months.  Since onset it is stable.          Comments    3 Month F/U OU  Pt sts, "I come in here and say I can't see and he says I can." Pt sts [facility] OU is stable from last visit. Pt denies ocular pain OU.        Last edited by Rockie Neighbours, [place] on 06/13/2020  3:50 PM. (History)      Referring physician: Denita Lung, MD [place],   [id number]  HISTORICAL INFORMATION:   Selected notes from the MEDICAL RECORD NUMBER       CURRENT MEDICATIONS: Current Outpatient Medications (Ophthalmic Drugs)  Medication Sig  . timolol (TIMOPTIC) 0.5 % ophthalmic solution Place 1 drop into the left eye daily.    No current facility-administered medications for this visit. (Ophthalmic Drugs)   Current Outpatient Medications (Other)  Medication Sig  . Acetaminophen (ACETAMIN PO) Take 500 mg by mouth every 4 (four) hours as needed (pain).   [name] alfuzosin (UROXATRAL) 10 MG 24 hr tablet Take 10 mg by mouth at bedtime.   . calcium-vitamin D (OSCAL WITH D) 250-125 MG-UNIT tablet Take 1 tablet by mouth daily.  . carbidopa-levodopa (SINEMET IR) 25-100 MG tablet Take 1.5 tablets by mouth 3 (three) times daily.  . citalopram (CELEXA) 20 MG tablet Take 1 tablet (20 mg total) by mouth daily.  [name] docusate sodium (COLACE) 100 MG capsule Take 1 capsule (100 mg total) by mouth daily as needed.  . irbesartan (AVAPRO) 300 MG tablet TAKE 1 TABLET BY MOUTH EVERY MORNING  . LIVALO 1 MG TABS TAKE 1 TABLET (1 MG TOTAL) BY MOUTH 2 TIMES A WEEK. TAKES ON MONDAY AND FRIDAY  . Multiple Vitamin (MULTIVITAMIN WITH MINERALS) TABS tablet Take 1 tablet by mouth 4 (four) times a week.   .  nitroGLYCERIN (NITROSTAT) 0.4 MG SL tablet PLACE 1 TABLET UNDER THE TONGUE EVERY 5 MINUTES AS NEEDED FOR CHEST PAIN.  [name] omega-3 acid ethyl esters (LOVAZA) 1 g capsule Take by mouth 2 (two) times daily.  . pantoprazole (PROTONIX) 40 MG tablet Take 40 mg by mouth 2 (two) times daily.   [name] senna (SENOKOT) 8.6 MG TABS tablet Take 2 tablets (17.2 mg total) by mouth 2 (two) times daily.  . trihexyphenidyl (ARTANE) 2 MG tablet TAKE 1/2 TABLET BY MOUTH TWO TIMES DAILY WITH MEALS  . verapamil (CALAN-SR) 240 MG CR tablet TAKE 1 TABLET (240 MG TOTAL) BY MOUTH AT BEDTIME.   No current facility-administered medications for this visit. (Other)      REVIEW OF SYSTEMS:    ALLERGIES No Known Allergies  PAST MEDICAL HISTORY Past Medical History:  Diagnosis Date  . Allergy   . Arthritis   . BPH (benign prostatic hyperplasia)   . Complication of anesthesia    Pt. stated he had a reaction that ended in him requiring urinary cath placement  . Diverticulosis   . GERD (gastroesophageal reflux disease)    esophageal spasms  . Glaucoma   . Gout   . Head injury, closed,  with concussion   . Hepatitis C    chronic - Has been treated with Harvoni  . HLD (hyperlipidemia)    statin intolerant (Crestor & Simvastatin) - Taking Livalo 1mg  / week  . Hypertension   . Parkinson's disease ([place])   . Plantar fasciitis    right  . PVD (peripheral vascular disease) ([place])    With no claudication; only mild abdominal aortic atherosclerosis noted on ultrasound.  . Thoracic ascending aortic aneurysm (HCC)    4.2 cm ascending TAA 09/2016 CT, 1 yr f/u rec  . Ulcer    Past Surgical History:  Procedure Laterality Date  . BUNIONECTOMY WITH WEIL OSTEOTOMY Right 11/02/2019   Procedure: Right Foot Lapidus, Modified McBride Bunionectomy,  Hallux Akin Osteotomy;  Surgeon: Wylene Simmer, MD;  Location: [facility];  Service: Orthopedics;  Laterality: Right;  . CARDIAC CATHETERIZATION  2005   30% Cx. Dr. Melvern Banker   . cataract surgery Left 11/05/2014  . COLONOSCOPY    . ESOPHAGOGASTRODUODENOSCOPY N/A 05/02/2015   Procedure: ESOPHAGOGASTRODUODENOSCOPY (EGD);  Surgeon: Ronald Lobo, MD;  Location: [facility];  Service: Endoscopy;  Laterality: N/A;  . ESOPHAGOGASTRODUODENOSCOPY  05/02/2015   no source of pt chest pain endoscopically evident. small hiatal hernia.  [name] EYE SURGERY    . HARDWARE REMOVAL Right 11/02/2019   Procedure: Second Metatarsal Removal of Deep Implant and Rotational Osteotomy;  Surgeon: Wylene Simmer, MD;  Location: [facility];  Service: Orthopedics;  Laterality: Right;  . MEMBRANE PEEL Left 03/14/2014   Procedure: MEMBRANE PEEL; ENDOLASER;  Surgeon: Hurman Horn, MD;  Location: [facility];  Service: Ophthalmology;  Laterality: Left;  . NM MYOVIEW LTD  2011   Neg Ischemia or infarct.  [name] NM MYOVIEW LTD  10/2015   LOW RISK. Small, fixed basal lateral defect - likely diaphragmatic attenuation. EF 69%  . PARS PLANA VITRECTOMY Left 03/14/2014   Procedure: PARS PLANA VITRECTOMY WITH 25 GAUGE;  Surgeon: Hurman Horn, MD;  Location: [facility];  Service: Ophthalmology;  Laterality: Left;  . TONSILLECTOMY    . TRANSTHORACIC ECHOCARDIOGRAM  2013   Normal EF. No significant Valve Disease  . UPPER GASTROINTESTINAL ENDOSCOPY    . WEIL OSTEOTOMY Right 09/01/2017   Procedure: RIGHT GREAT TOE CHEVRON AND WEIL OSTEOTOMY 2ND METATARSAL;  Surgeon: Newt Minion, MD;  Location: [facility];  Service: Orthopedics;  Laterality: Right;    FAMILY HISTORY Family History  Problem Relation Age of Onset  . Heart disease Mother   . Cancer Paternal Grandfather   . Cancer Sister     SOCIAL HISTORY Social History   Tobacco Use  . Smoking status: Former Smoker    Types: Cigars    Quit date: 07/27/1988    Years since quitting: 31.9  . Smokeless tobacco: Never Used  Vaping Use  . Vaping Use: Never used  Substance Use Topics  . Alcohol use: Yes    Alcohol/week: 3.0 standard drinks    Types: 1 Glasses of  wine, 1 Cans of beer, 1 Shots of liquor per week    Comment: occasional  . Drug use: No         OPHTHALMIC EXAM: Base Eye Exam    Visual Acuity (ETDRS)      Right Left   Dist cc CF @ 6' 20/20   Dist ph cc NI    Correction: Glasses       Tonometry (Tonopen, 3:51 PM)      Right Left   Pressure 13 20  Tonometry #2 (Tonopen, 3:56 PM)      Right Left   Pressure  20       Pupils      Dark Light Shape React APD   Right 6 6 Irregular Minimal +1   Left 4 3 Round Slow None       Visual Fields (Counting fingers)      Left Right    Full Full       Extraocular Movement      Right Left    Full Full       Neuro/Psych    Oriented x3: Yes   Mood/Affect: Normal       Dilation    Both eyes: 1.0% Mydriacyl, 2.5% Phenylephrine @ 3:56 PM        Slit Lamp and Fundus Exam    External Exam      Right Left   External Normal Normal       Slit Lamp Exam      Right Left   Lids/Lashes Normal Normal   Conjunctiva/Sclera White and quiet White and quiet   Cornea Slight thickening, no corneal touch with tube, mostly clear Clear   Anterior Chamber Tube, Tube with  NO corneal touch Deep and quiet   Iris Round and reactive Round and reactive   Lens Anterior chamber intraocular lens Posterior chamber intraocular lens   Anterior Vitreous Normal Normal       Fundus Exam      Right Left   Posterior Vitreous Vitrectomized, clear Vitrectomized   Disc 2+ Pallor temporal 1+ Pallor temporal   C/D Ratio 0.75 0.55   Macula Diffuse atrophy, no macular thickening Microaneurysms, no macular thickening   Vessels Old, RVO old vasculitis now inactive Old macular BRVO, inactive   Periphery Good PRP Good PRP sector          IMAGING AND PROCEDURES  Imaging and Procedures for 06/13/20  OCT, Retina - OU - Both Eyes       Right Eye Quality was good. Scan locations included subfoveal. Central Foveal Thickness: 185. Progression has been stable.   Left Eye Quality was good. Scan  locations included subfoveal. Central Foveal Thickness: 373. Progression has been stable.   Notes Diffuse macular atrophy OD, stable over the last 10 years  OS with old inner retinal microcystoid change, none in the fovea, stable over the last 6 years.       Color Fundus Photography Optos - OU - Both Eyes       Right Eye Progression has been stable. Disc findings include increased cup to disc ratio, pallor.   Left Eye Progression has been stable. Disc findings include increased cup to disc ratio, pallor.   Notes Clear media OU  Good laser chorioretinal scars, retina attached.  Diffuse retinal vasculitis now quiesced sent.  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 Retinal vasculitis, bilateral  H35.063     1.  2.  3.  Ophthalmic  Meds Ordered this visit:  No orders of the defined types were placed in this encounter.      Return in about 6 months (around 12/11/2020) for DILATE OU, OCT, COLOR FP.  There are no Patient Instructions on file for this visit.   Explained the diagnoses, plan, and follow up with the patient and they expressed understanding.  Patient expressed understanding of the importance of proper follow up care.   Clent Demark Chassity Ludke M.D. Diseases & Surgery of the Retina and Vitreous [facility] [place] 06/13/20     Abbreviations: M myopia (nearsighted); A astigmatism; H hyperopia (farsighted); P presbyopia; Mrx spectacle prescription;  CTL contact lenses; OD right eye; OS left eye;  OU both eyes  XT exotropia; ET esotropia; PEK punctate epithelial keratitis; PEE punctate epithelial erosions; DES dry eye syndrome; MGD meibomian gland dysfunction; ATs artificial tears; PFAT's preservative free artificial tears; [place] nuclear sclerotic cataract; PSC posterior subcapsular cataract; ERM epi-retinal membrane; PVD posterior vitreous detachment; RD retinal detachment; DM diabetes mellitus; DR diabetic retinopathy; NPDR non-proliferative diabetic retinopathy; PDR proliferative diabetic retinopathy; CSME clinically significant macular edema; DME diabetic macular edema; dbh dot blot hemorrhages; CWS cotton wool spot; POAG primary open angle glaucoma; C/D cup-to-disc ratio; HVF humphrey visual field; GVF goldmann visual field; OCT optical coherence tomography; IOP intraocular pressure; BRVO Branch retinal vein occlusion; CRVO central retinal vein occlusion; CRAO central retinal artery occlusion; BRAO branch retinal artery occlusion; RT retinal tear; SB scleral buckle; PPV pars plana vitrectomy; VH Vitreous hemorrhage; PRP panretinal laser photocoagulation; IVK intravitreal kenalog; VMT vitreomacular traction; MH Macular hole;  NVD neovascularization of the disc; NVE neovascularization elsewhere; AREDS age related eye disease study; ARMD age related macular degeneration; POAG primary open angle glaucoma; EBMD epithelial/anterior basement membrane dystrophy; ACIOL anterior chamber intraocular lens; IOL intraocular lens; PCIOL posterior chamber intraocular lens; Phaco/IOL phacoemulsification with intraocular lens placement; Zap photorefractive keratectomy; LASIK laser assisted in situ keratomileusis; HTN hypertension; DM diabetes mellitus; COPD chronic obstructive pulmonary disease

## 2020-06-13 NOTE — Assessment & Plan Note (Signed)
Stable currently inactive

## 2020-06-13 NOTE — Assessment & Plan Note (Signed)
Disease is stable quiescent

## 2020-06-17 ENCOUNTER — Other Ambulatory Visit: Payer: Self-pay

## 2020-06-17 ENCOUNTER — Encounter: Payer: Self-pay | Admitting: Family Medicine

## 2020-06-17 ENCOUNTER — Ambulatory Visit (INDEPENDENT_AMBULATORY_CARE_PROVIDER_SITE_OTHER): Payer: Medicare Other | Admitting: Family Medicine

## 2020-06-17 VITALS — BP 112/74 | HR 77 | Temp 97.3°F | Wt 159.8 lb

## 2020-06-17 DIAGNOSIS — H6122 Impacted cerumen, left ear: Secondary | ICD-10-CM

## 2020-06-17 NOTE — Progress Notes (Signed)
   Subjective:    Patient ID: [name], male    DOB: 06-Feb-1951, 69 y.o.   MRN: [id number]  HPI He complains of difficulty hearing from the left ear.  No fever, chills, sore throat.   Review of Systems     Objective:   Physical Exam Left canal was filled with cerumen.  After it was removed the TM and canal are normal.  Right TM and canal normal.       Assessment & Plan:  Impacted cerumen of left ear Return here as needed.

## 2020-06-25 MED FILL — TRIHEXYPHENIDYL 2 MG TABLET: 2 | 30 days supply | Qty: 30 | Fill #2

## 2020-07-08 ENCOUNTER — Telehealth: Payer: Self-pay

## 2020-07-08 ENCOUNTER — Ambulatory Visit: Payer: Medicare Other | Admitting: Neurology

## 2020-07-08 ENCOUNTER — Encounter: Payer: Self-pay | Admitting: Neurology

## 2020-07-08 ENCOUNTER — Other Ambulatory Visit: Payer: Self-pay | Admitting: Medical

## 2020-07-08 ENCOUNTER — Other Ambulatory Visit: Payer: Self-pay | Admitting: Neurology

## 2020-07-08 ENCOUNTER — Other Ambulatory Visit: Payer: Self-pay

## 2020-07-08 VITALS — BP 125/80 | HR 77 | Ht 70.5 in | Wt 158.2 lb

## 2020-07-08 DIAGNOSIS — F32A Depression, unspecified: Secondary | ICD-10-CM

## 2020-07-08 DIAGNOSIS — R251 Tremor, unspecified: Secondary | ICD-10-CM

## 2020-07-08 MED ORDER — TRIHEXYPHENIDYL HCL 2 MG PO TABS: ORAL_TABLET | ORAL | 2 refills | Status: DC

## 2020-07-08 MED ORDER — CITALOPRAM HYDROBROMIDE 20 MG PO TABS: 20.0000 mg | ORAL_TABLET | Freq: Every day | ORAL | 1 refills | Status: DC

## 2020-07-08 MED FILL — CITALOPRAM HBR 20 MG TABLET: 20 | 90 days supply | Qty: 90 | Fill #0

## 2020-07-08 MED FILL — IRBESARTAN 300 MG TABS: 300 | 30 days supply | Qty: 30 | Fill #4

## 2020-07-08 NOTE — Patient Instructions (Addendum)
I had a long discussion with the patient and wife regarding his tremor predominant Parkinson signs which seems suboptimally controlled on the current medication regimen of Sinemet 25/100 1.5 tablets 3 times daily and Artane 2 mg half a tablet twice daily on alternate days.  He seems to be tolerating these well without side effects and so recommend increase Artane dose to 2 mg tablet half a tablet twice daily.  Continue Sinemet and the current dose.  I also advised him to keep his physical therapy appointment to improve his gait and balance.  He will return for follow-up in the future in 3 months or call earlier if necessary.  CT scan swelling about improvement

## 2020-07-08 NOTE — Progress Notes (Signed)
[facility] PATIENT: RODY KEADLE DOB: Mar 16, 1951   REASON FOR VISIT: Follow-up for tremor mild cognitive impairment, depression HISTORY FROM: Patient  HISTORY OF PRESENT ILLNESS:HISTORY:63 year African American male who since last year and a half has noticed increasing tremors mainly in the left arm and leg and to a lesser degree in the right arm as well. He states the tremors were quite mild but became more pronounced after a minor accident while staying [place] the [place] in [place]. He fell down 12 flights of steps and hit a concrete slab and had a concussion. He and some minor bruises and knee injury which took several months to reck of her period CT scan of the head was done 2 days later which was unremarkable. Since then he has had some walking difficulty but this may be related to his knee pain. He says that his feet do at times gets stuck in his started walking with a stooped posture. He however does not describe typical festination. He has resting and intermittent action tremor claiming the left arm and leg the tremor does improve with activities. The tremor does not seem to interfere with most of his routine. He denies significant bradykinesia, and drooling of saliva or micrographia. He has been evaluated by neurologists Dr. Reginia Forts at [facility] neurology but he is unable to tell me for the diagnosis was. He was not told that this may be Parkinson's and was not tried on dopaminergic medications. He did have an MRI scan and some lab work but I do not have those results for my review available today. Patient is here today for a second opinion as he feels his tremor is not getting better. He does admit to some light masonry hallucinations as well as restless sleep thrashing of his legs. He denies significant memory or cognitive difficulties. He has not noticed any particular effect of alcohol on his tremor. Is no family history of tremors. Patient does have  chronic hepatitis C and is planning to start treatment with dapsone. He has had no seizures, significant head injury with major loss of consciousness, stroke, TIAs or significant neurological problems.  Update 08/14/2014 : He returns for follow-up after last visit 3 months ago. He feels his tremors are about the same. He had tried reducing the dose of Sinemet but noticed that his tremors got worse and hence he went back up to the original dose which is 25/100 one tablet 3 times daily. He still feels occasionally confused and at times secondary to some cells but he admits that he worries a lot. He also admits to feeling depressed, getting tired easily and not having initiated. He has not been on any medications for depression. Patient denies significant drooling of saliva, bradykinesia, gait or balance problems. He feels his tremor is mild and does not interfere with his activities of daily living. I discussed alternative treatment options including addition of dopamine agonist, anticholinergic or even consideration for deep brain stimulation since his tremor is predominantly unilateral however the patient does not want to consider more aggressive treatment options at the present time. UPDATE 12/26/14 [name], 69 year old black male returns for follow-up. He was last seen by Dr. [name] 08/14/2014. He feels his tremors are better since increasing his carbidopa levodopa to 1-1/2 tablets 3 times daily. He is tolerating it well without dizziness, sleepiness, nightmares or hallucinations A MRI Brain with and without Contrast completed on 07/29/2011 was normal. The Mini-Mental status exam today is unchanged 29 out  of 30. He did not follow-up with his primary care for  complaints of depression. He denies any significant drooling bradykinesia falls or balance problems. His tremor does not interfere with his activities of daily living. He returns for reevaluation Update 07/03/2015 : He returns for follow-up after last  visit 6 months ago. He states his tremors continue to do well and he seems to have responded quite well to increased dose of Sinemet 25/100 1-1/2 tablet 3 times daily. Is tolerating it well without any dizziness, sleepiness, nightmares, hallucinations, nausea or diarrhea. He continues to have mild memory difficulties but his has not been on any treatment for depression and in fact has not seen his primary care physician for the same for quite some time. Update 08/11/2016 PS : He returns for follow-up after last visit year ago. He states his tremors as well as memory loss or more or less unchanged. Continues to have intermittent tremors in the left hand mainly. The tremors fluctuate he has good days and bad days. He is currently taking Sinemet 25/100 one and half tablet 3 times daily. He complains of mild fatigue and daytime sleepiness. He denies hallucinations, delusions, dizziness or upset stomach. He does not want to try higher dose because of fear of side effects. Continues to have short-term memory difficulties. He has not been quite compulsive about taking notes and using memory compensation strategies but plans to do so. He has no new complaints today. UPDATE 07/19/2018CM Mr. Langhorst, 69 year old male returns for follow-up with tremors and mild cognitive impairment which he says is stable. He continues to have a resting tremor in the left hand which is intermittent. He continues to have good and bad days. He remains on Sinemet 25 100 (1 and half tablets) 3 times daily. He does little exercise he is not doing any memory compensation strategies at this time and was encouraged to do so. He denies any falls. He denies any hallucinations or increased confusion. He denies any freezing spells. He does complain of left hip pain He returns for reevaluation UPDATE 1/22/2019CM Mr. Elwood, 39 he denies 40-year-old male returns for follow-up with a history of Parkinson's disease.  He continues to have mild resting tremor in  the left hand which is intermittent.  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  He remains on Sinemet 3 times a day however he is taking his medication with a meal.  He complains of sometimes being lightheaded when he stands up too quickly.  He was encouraged to sit on the side of the bed and then get up slightly.  He was also encouraged to stay well-hydrated.  He has not been exercising.  He denies any hallucinations or increased confusion.  He has not had any freezing spells.  He returns for reevaluation 02/15/18 UPDATE:JV  Patient returns today for six-month follow-up.  He continues to take Sinemet 3 times daily with the first dose around 6 AM (1 hour prior to eating breakfast), second dose 11 AM (1 hour prior to eating lunch) and then third dose between 5:30 and 6 PM (eats dinner around 6:30-7 PM).  Patient denies any increase in symptoms between doses but does state if he is late on a dose, he will start having increased tremors in his left upper extremity and left lower extremity.  He continues to complain of short-term memory issue and is unable to state whether it is worsening but he states he notices it more.  He did obtain a large book full  avoid games but does this approximately 1 time per week.  Recommended to do these activities at least once daily.  Patient also has complaints of feeling as though he overanalyzes certain situations and feeling increased fatigue.  Patient does endorse increased stress recently but does feel somewhat situational.  Provided patient with stress relaxation techniques and advised him if this continues, to follow-up with PCP for possible medication management.  He denies any hallucinations or increased confusion.  He denies any freezing spells. UPDATE 1/29/2020CM Mr. Fragoso, 69 year old male returns for follow-up with a history of Parkinson's disease and mild cognitive impairment.  He denies any freezing spells any stiffness any falls.  He denies any difficulty  swallowing.  He has spinal stenosis and had recent injection.  He is seeing Dr. Sharol Given for a foot problem.  He is currently on Sinemet 3 times a day before meals.  He has not been exercising and was encouraged to do so.  He has not had any hallucinations or increased confusion.  Noted very intermittent left tremor of the hand.  Patient reports some anxiety in public.  He was encouraged to perform stress relaxation techniques.  He returns for reevaluation  : Update 02/23/2019 :  he returns for follow-up after last visit 6 months ago.  He states his tremors are doing well until a month ago when he is noticed worsening of his tremor both at rest as well as using his hands.  He remains on Sinemet 25/100 mg 1-1/2 tablet 3 times daily.  Is tolerating them well without any nausea, diarrhea, dizziness, sleepiness or hallucinations.  He states his short-term memory difficulties are unchanged.  He has not been socializing a lot or being active but plans to do that soon.  He has no other complaints.  He denies significant drooling, bradykinesia, stooped posture gait or balance problems Update 09/04/2019 : He returns for follow-up after last visit 6 months ago.  He states that his tremors continue to intermittently not do well.  He has no more bad days than good days.  Has noticed some tremor in his legs as well intermittently.  Patient was advised to try higher dose of Sinemet 2 tablets 3 times daily at last visit.  He states he was not able to tolerate this because of dizziness and had to cut back to 1-1/2 3 times daily which is tolerating better.  He is also noticed that his balance is off but he blames this on having some problems in his toes.  He has had no major falls or injuries.  He can still get up easily from his chair.  He denies significant drooling of saliva.  He does have some at times disturbed sleep and reviewed dreams and talks in his sleep.  Gets startled easily. Update 01/10/2020 : He returns for follow-up  after last visit 4 months ago.  Is accompanied by his wife.  The patient was started on Azilect at last visit but had trouble tolerating it daily due to sleepiness, tiredness and dizziness.  He has since switched to using Azilect 0.5 mg twice daily every other day and seems to be tolerating it better.  However his tremors still persists and appear to get worse mostly when he is anxious or with severe exertion.  He feels the tremor are not particularly disabling and he can handle it.  Continues to have poor short-term memory and needs to write things down but still remembers only occasionally.  Sometimes he may remember later.  Wife  notes that occasionally he may pause in midsentence and searches for words.  At times even starts stuttering as he is trying to find the word.  He has no family history of Alzheimer's or dementia.  Still fairly independent and manages most activities for himself.  He remains currently on Sinemet 25/100 1.5 tablets 4 times daily which is tolerating well without significant nightmares, hallucinations, daytime sleepiness or nausea. Update 06/28/2020 : He returns for follow-up after last visit 6 months ago.  He is accompanied by his wife.  He continues to have intermittent tremors mostly in the left hand which are bothersome and he has good days and bad days.  Patient still continues to take Artane 2 mg tablets only half a tablet twice daily on alternate days and not every day as instructed at last visit.  He remains on Sinemet 25/100 mg 1.5 tablets 3 times daily and he has had trouble tolerating higher doses in the past.  Is not having significant dizziness or upset stomach but is still having some disturbed sleep with crazy dreams.  He denies hallucinations or delusions.  He is careful with his walking and has had a few near falls but no major falls or injuries.  He denies excessive drooling of saliva or bradykinesia or mobility issues.  He does admit to decreased short-term memory,  attention and concentration difficulties but these appear to be stable and unchanged. REVIEW OF SYSTEMS: Full 14 system review of systems performed and notable only for those listed, near falls, crazy dreams tremors, imbalance, dizziness and memory difficulties only and all other systems negative ALLERGIES: No Known Allergies  HOME MEDICATIONS: Outpatient Medications Prior to Visit  Medication Sig Dispense Refill  . Acetaminophen (ACETAMIN PO) Take 500 mg by mouth every 4 (four) hours as needed (pain).     [name] alfuzosin (UROXATRAL) 10 MG 24 hr tablet Take 10 mg by mouth at bedtime.     . calcium-vitamin D (OSCAL WITH D) 250-125 MG-UNIT tablet Take 1 tablet by mouth daily.    . carbidopa-levodopa (SINEMET IR) 25-100 MG tablet Take 1.5 tablets by mouth 3 (three) times daily. 405 tablet 1  . docusate sodium (COLACE) 100 MG capsule Take 1 capsule (100 mg total) by mouth daily as needed. 30 capsule 2  . irbesartan (AVAPRO) 300 MG tablet TAKE 1 TABLET BY MOUTH EVERY MORNING 30 tablet 11  . LIVALO 1 MG TABS TAKE 1 TABLET (1 MG TOTAL) BY MOUTH 2 TIMES A WEEK. TAKES ON MONDAY AND FRIDAY 25 tablet 3  . Multiple Vitamin (MULTIVITAMIN WITH MINERALS) TABS tablet Take 1 tablet by mouth 4 (four) times a week.     . nitroGLYCERIN (NITROSTAT) 0.4 MG SL tablet PLACE 1 TABLET UNDER THE TONGUE EVERY 5 MINUTES AS NEEDED FOR CHEST PAIN. 25 tablet 3  . omega-3 acid ethyl esters (LOVAZA) 1 g capsule Take by mouth 2 (two) times daily.    . pantoprazole (PROTONIX) 40 MG tablet Take 40 mg by mouth 2 (two) times daily.  0  . senna (SENOKOT) 8.6 MG TABS tablet Take 2 tablets (17.2 mg total) by mouth 2 (two) times daily. 30 tablet 0  . timolol (TIMOPTIC) 0.5 % ophthalmic solution Place 1 drop into the left eye daily.     . verapamil (CALAN-SR) 240 MG CR tablet TAKE 1 TABLET (240 MG TOTAL) BY MOUTH AT BEDTIME. 90 tablet 3  . citalopram (CELEXA) 20 MG tablet Take 1 tablet (20 mg total) by mouth daily. 30 tablet 0  .  trihexyphenidyl (ARTANE) 2 MG tablet TAKE 1/2 TABLET BY MOUTH TWO TIMES DAILY WITH MEALS 30 tablet 2   No facility-administered medications prior to visit.    PAST MEDICAL HISTORY: Past Medical History:  Diagnosis Date  . Allergy   . Arthritis   . BPH (benign prostatic hyperplasia)   . Complication of anesthesia    Pt. stated he had a reaction that ended in him requiring urinary cath placement  . Diverticulosis   . GERD (gastroesophageal reflux disease)    esophageal spasms  . Glaucoma   . Gout   . Head injury, closed, with concussion   . Hepatitis C    chronic - Has been treated with Harvoni  . HLD (hyperlipidemia)    statin intolerant (Crestor & Simvastatin) - Taking Livalo 1mg  / week  . Hypertension   . Parkinson's disease ([place])   . Plantar fasciitis    right  . PVD (peripheral vascular disease) ([place])    With no claudication; only mild abdominal aortic atherosclerosis noted on ultrasound.  . Thoracic ascending aortic aneurysm (H[facility])    4.2 cm ascending TAA 09/2016 CT, 1 yr f/u rec  . Ulcer     PAST SURGICAL HISTORY: Past Surgical History:  Procedure Laterality Date  . BUNIONECTOMY WITH WEIL OSTEOTOMY Right 11/02/2019   Procedure: Right Foot Lapidus, Modified McBride Bunionectomy,  Hallux Akin Osteotomy;  Surgeon: Wylene Simmer, MD;  Location: [place];  Service: Orthopedics;  Laterality: Right;  . CARDIAC CATHETERIZATION  2005   30% Cx. Dr. Melvern Banker  . cataract surgery Left 11/05/2014  . COLONOSCOPY    . ESOPHAGOGASTRODUODENOSCOPY N/A 05/02/2015   Procedure: ESOPHAGOGASTRODUODENOSCOPY (EGD);  Surgeon: Ronald Lobo, MD;  Location: [facility];  Service: Endoscopy;  Laterality: N/A;  . ESOPHAGOGASTRODUODENOSCOPY  05/02/2015   no source of pt chest pain endoscopically evident. small hiatal hernia.  [name] EYE SURGERY    . HARDWARE REMOVAL Right 11/02/2019   Procedure: Second Metatarsal Removal of Deep Implant and Rotational Osteotomy;  Surgeon: Wylene Simmer, MD;   Location: [place];  Service: Orthopedics;  Laterality: Right;  . MEMBRANE PEEL Left 03/14/2014   Procedure: MEMBRANE PEEL; ENDOLASER;  Surgeon: Hurman Horn, MD;  Location: [place];  Service: Ophthalmology;  Laterality: Left;  . NM MYOVIEW LTD  2011   Neg Ischemia or infarct.  [name] NM MYOVIEW LTD  10/2015   LOW RISK. Small, fixed basal lateral defect - likely diaphragmatic attenuation. EF 69%  . PARS PLANA VITRECTOMY Left 03/14/2014   Procedure: PARS PLANA VITRECTOMY WITH 25 GAUGE;  Surgeon: Hurman Horn, MD;  Location: [place];  Service: Ophthalmology;  Laterality: Left;  . TONSILLECTOMY    . TRANSTHORACIC ECHOCARDIOGRAM  2013   Normal EF. No significant Valve Disease  . UPPER GASTROINTESTINAL ENDOSCOPY    . WEIL OSTEOTOMY Right 09/01/2017   Procedure: RIGHT GREAT TOE CHEVRON AND WEIL OSTEOTOMY 2ND METATARSAL;  Surgeon: Newt Minion, MD;  Location: [place];  Service: Orthopedics;  Laterality: Right;    FAMILY HISTORY: Family History  Problem Relation Age of Onset  . Heart disease Mother   . Cancer Paternal Grandfather   . Cancer Sister     SOCIAL HISTORY: Social History   Socioeconomic History  . Marital status: Married    Spouse name: barbra gen  . Number of children: 3  . Years of education: AS  . Highest education level: Not on file  Occupational History  . 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 Smoking status: Former Smoker    Types: Cigars    Quit date: 07/27/1988    Years since quitting: 31.9  . Smokeless tobacco: Never Used  Vaping Use  . Vaping Use: Never used  Substance and Sexual Activity  . Alcohol use: Yes    Alcohol/week: 3.0 standard drinks    Types: 1 Glasses of wine, 1 Cans of beer, 1 Shots of liquor per week    Comment: occasional  . Drug use: No  . Sexual activity: Yes  Other Topics Concern  . Not on file  Social History Narrative   Lives with wife    Right handed   Drinks 1-2  cups caffeine daily   Social Determinants of Health   Financial Resource Strain: Not on file  Food Insecurity: Not on file  Transportation Needs: Not on file  Physical Activity: Not on file  Stress: Not on file  Social Connections: Not on file  Intimate Partner Violence: Not on file     PHYSICAL EXAM  Vitals:   07/08/20 1259  BP: 125/80  Pulse: 77  Weight: 71.8 kg  Height: 5' 10.5" (1.791 m)   Body mass index is 22.38 kg/m.  Generalized: Frail elderly African-American male in no acute distress   Head: normocephalic and atraumatic,.   Neck: Supple,  Musculoskeletal: No deformity right foot boot for recent bunion surgery  Neurological examination   Mentation: Alert. Clock drawing 4/4, able to name 14 animals which walk on 4 legs.  Normal  Recall 3/3.  Able to name 13 animals which can walk on 4 legs. MMSE - Mini Mental State Exam 08/24/2018 05/11/2014  Orientation to time 5 5  Orientation to Place 5 5  Registration 3 3  Attention/ Calculation 5 5  Recall 2 2  Language- name 2 objects 2 2  Language- repeat 1 1  Language- follow 3 step command 3 3  Language- read & follow direction 1 1  Write a sentence 1 1  Write a sentence-comments no subject -  Copy design 1 1  Total score 29 29  Follows all commands speech and language fluent.  Weakly positive glabellar tap.  Slightly diminished facial expression. Cranial nerve II-XII: Pupils were equal round reactive to light extraocular movements were full, visual field were full on confrontational test. Facial sensation and strength were normal. hearing was intact to finger rubbing bilaterally. Uvula tongue midline. head turning and shoulder shrug were normal and symmetric.Tongue protrusion into cheek strength was normal. Motor: normal bulk and tone, full strength in the BUE, BLE, .  Diminished amplitude of finger tapping on the left with fatigue, intermittent left hand resting tremor, mild cogwheel rigidity upon activation at the  left wrist.. Tremor improves with action and intention.  No bradykinesia Sensory: normal and symmetric to light touch, on the face arms and legs  Coordination: finger-nose-finger, heel-to-shin bilaterally, no dysmetria Reflexes: 1+ upper lower and symmetric plantar responses were flexor bilaterally. Gait and Station: Rising up from seated position without assistance, no festination or stooped posture.  Ambulated well in the hall  moderate stride, decreased  arm swing on the left., smooth turning, no assistive device.  DIAGNOSTIC DATA (LABS, IMAGING, TESTING) - I reviewed patient records, labs, notes, testing and imaging myself where available.  Lab Results  Component Value Date   WBC 5.2 11/29/2019   HGB 14.3 11/29/2019   HCT 42.2 11/29/2019   MCV 93 11/29/2019  PLT 297 11/29/2019      Component Value Date/Time   NA 138 11/29/2019 1412   K 4.4 11/29/2019 1412   CL 102 11/29/2019 1412   CO2 25 11/29/2019 1412   GLUCOSE 81 11/29/2019 1412   GLUCOSE 113 (H) 04/19/2018 0411   BUN 23 11/29/2019 1412   CREATININE 1.38 (H) 11/29/2019 1412   CREATININE 1.06 06/29/2014 0920   CALCIUM 9.4 11/29/2019 1412   PROT 7.1 11/29/2019 1412   ALBUMIN 4.4 11/29/2019 1412   AST 16 11/29/2019 1412   ALT 10 11/29/2019 1412   ALKPHOS 84 11/29/2019 1412   BILITOT 0.4 11/29/2019 1412   GFRNONAA 52 (L) 11/29/2019 1412   GFRAA 60 11/29/2019 1412   Lab Results  Component Value Date   CHOL 148 11/29/2019   HDL 53 11/29/2019   LDLCALC 84 11/29/2019   TRIG 54 11/29/2019   CHOLHDL 2.8 11/29/2019     Lab Results  Component Value Date   TSH 1.950 09/28/2016    ASSESSMENT AND PLAN 69 y.o. 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 Good response to Sinemet but recent worsening of tremors but has been unable to tolerate higher dose due to side effects.. He also complains of mild cognitive  impairment which appears stable.    PLAN:I had a long discussion with the patient and wife regarding his tremor predominant Parkinson signs which seems suboptimally controlled on the current medication regimen of Sinemet 25/100 1.5 tablets 3 times daily and Artane 2 mg half a tablet twice daily on alternate days.  He seems to be tolerating these well without side effects and so recommend increase Artane dose to 2 mg tablet half a tablet twice daily.  Continue Sinemet and the current dose.  I also advised him to keep his physical therapy appointment to improve his gait and balance.  He will return for follow-up in the future in 3 months or call earlier if necessary. I spent 25 minutes in total face to face time with the patient more than 50% of which was spent counseling and coordination of care, reviewing test results reviewing medications and discussing and reviewing the diagnosis of Parkinson's disease and mild cognitive impairment and importance of cognitively challenging activities  Antony Contras, MD   [facility] [address], [place], [place] [address] [phone]

## 2020-07-08 NOTE — Telephone Encounter (Signed)
Received fax from [facility] [place]acy for a refill on the pts. Citalopram pt. Last apt was 06/17/20 and next apt is 09/10/20.

## 2020-07-10 ENCOUNTER — Encounter: Payer: Self-pay | Admitting: Rehabilitative and Restorative Service Providers"

## 2020-07-10 ENCOUNTER — Other Ambulatory Visit: Payer: Self-pay

## 2020-07-10 ENCOUNTER — Ambulatory Visit: Payer: Medicare Other | Attending: Neurosurgery | Admitting: Rehabilitative and Restorative Service Providers"

## 2020-07-10 DIAGNOSIS — R293 Abnormal posture: Secondary | ICD-10-CM

## 2020-07-10 DIAGNOSIS — G8929 Other chronic pain: Secondary | ICD-10-CM

## 2020-07-10 DIAGNOSIS — M6281 Muscle weakness (generalized): Secondary | ICD-10-CM

## 2020-07-10 DIAGNOSIS — M545 Low back pain, unspecified: Secondary | ICD-10-CM | POA: Insufficient documentation

## 2020-07-10 NOTE — Therapy (Signed)
[place], [place], [address] Phone: [phone]   Fax:  [phone]  Physical Therapy Evaluation  Patient Details  Name: [name] MRN: [id number] Date of Birth: [birth year] Referring Provider (PT): Kathyrn Sheriff, MD   Encounter Date: 07/10/2020   PT End of Session - 07/10/20 1535    Visit Number 1    Number of Visits 10    Date for PT Re-Evaluation 08/14/20    Authorization Type UHC MCR    Progress Note Due on Visit 10    PT Start Time 0233    PT Stop Time 0321    PT Time Calculation (min) 48 min    Activity Tolerance Patient tolerated treatment well;No increased pain    Behavior During Therapy WFL for tasks assessed/performed           Past Medical History:  Diagnosis Date  . Allergy   . Arthritis   . BPH (benign prostatic hyperplasia)   . Complication of anesthesia    Pt. stated he had a reaction that ended in him requiring urinary cath placement  . Diverticulosis   . GERD (gastroesophageal reflux disease)    esophageal spasms  . Glaucoma   . Gout   . Head injury, closed, with concussion   . Hepatitis C    chronic - Has been treated with Harvoni  . HLD (hyperlipidemia)    statin intolerant (Crestor & Simvastatin) - Taking Livalo 1mg  / week  . Hypertension   . Parkinson's disease ([place])   . Plantar fasciitis    right  . PVD (peripheral vascular disease) ([place])    With no claudication; only mild abdominal aortic atherosclerosis noted on ultrasound.  . Thoracic ascending aortic aneurysm (HCC)    4.2 cm ascending TAA 09/2016 CT, 1 yr f/u rec  . Ulcer     Past Surgical History:  Procedure Laterality Date  . BUNIONECTOMY WITH WEIL OSTEOTOMY Right 11/02/2019   Procedure: Right Foot Lapidus, Modified McBride Bunionectomy,  Hallux Akin Osteotomy;  Surgeon: Wylene Simmer, MD;  Location: [facility];  Service: Orthopedics;  Laterality: Right;  . CARDIAC CATHETERIZATION  2005    30% Cx. Dr. Melvern Banker  . cataract surgery Left 11/05/2014  . COLONOSCOPY    . ESOPHAGOGASTRODUODENOSCOPY N/A 05/02/2015   Procedure: ESOPHAGOGASTRODUODENOSCOPY (EGD);  Surgeon: Ronald Lobo, MD;  Location: [facility];  Service: Endoscopy;  Laterality: N/A;  . ESOPHAGOGASTRODUODENOSCOPY  05/02/2015   no source of pt chest pain endoscopically evident. small hiatal hernia.  [name]YE SURGERY    . HARDWARE REMOVAL Right 11/02/2019   Procedure: Second Metatarsal Removal of Deep Implant and Rotational Osteotomy;  Surgeon: Wylene Simmer, MD;  Location: [facility];  Service: Orthopedics;  Laterality: Right;  . MEMBRANE PEEL Left 03/14/2014   Procedure: MEMBRANE PEEL; ENDOLASER;  Surgeon: Hurman Horn, MD;  Location: [facility];  Service: Ophthalmology;  Laterality: Left;  . NM MYOVIEW LTD  2011   Neg Ischemia or infarct.  [name] NM MYOVIEW LTD  10/2015   LOW RISK. Small, fixed basal lateral defect - likely diaphragmatic attenuation. EF 69%  . PARS PLANA VITRECTOMY Left 03/14/2014   Procedure: PARS PLANA VITRECTOMY WITH 25 GAUGE;  Surgeon: Hurman Horn, MD;  Location: [facility];  Service: Ophthalmology;  Laterality: Left;  . TONSILLECTOMY    . TRANSTHORACIC ECHOCARDIOGRAM  2013   Normal EF. No significant Valve Disease  . UPPER GASTROINTESTINAL ENDOSCOPY    . WEIL OSTEOTOMY Right 09/01/2017  Procedure: RIGHT GREAT TOE CHEVRON AND WEIL OSTEOTOMY 2ND METATARSAL;  Surgeon: Newt Minion, MD;  Location: [facility];  Service: Orthopedics;  Laterality: Right;    There were no vitals filed for this visit.    Subjective Assessment - 07/10/20 1436    Subjective I am here for my back but I saw my Parkinson's doctor and he said I may need to do my treatments at the neuro rehab center. I have problems with balance and quick turns. My last fall was several years ago. I see the Parkinson's doctor every 6 months. LBP begins in low back and radiates across my thigh; when I get up and begin moving around, pain may lighten  up. Can't lift anything heavy b/c of pain and MD (even trash bag, case of water).    Pertinent History Early onset Parkinsons. Spinal stenosis. cortisone injection in spine 3 weeks ago.    Limitations Sitting;House hold activities;Lifting;Standing;Walking    How long can you sit comfortably? depends; sometimes its an hour. Mornings are most difficult.    How long can you stand comfortably? up to 1 hour    How long can you walk comfortably? 30 min    Diagnostic tests see chart    Patient Stated Goals to be able to move with less pain    Currently in Pain? Yes    Pain Score 3     Pain Location Back    Pain Orientation Posterior;Left    Pain Descriptors / Indicators Tightness;Aching    Pain Type Chronic pain    Pain Radiating Towards grabbing back pain radiates down bil LEs R > L to mid-thigh    Pain Onset More than a month ago    Pain Frequency Intermittent    Aggravating Factors  bending    Pain Relieving Factors Acetaminophen, rest    Multiple Pain Sites No              [facility] PT Assessment - 07/10/20 0001      Assessment   Medical Diagnosis spinal stenosis with neurogenic claudication    Referring Provider (PT) Kathyrn Sheriff, MD    Onset Date/Surgical Date 06/13/20    Hand Dominance Right    Next MD Visit TBD    Prior Therapy none      Precautions   Precautions None    Precaution Comments no heavy lifting      Restrictions   Weight Bearing Restrictions No      Balance Screen   Has the patient fallen in the past 6 months No    Has the patient had a decrease in activity level because of a fear of falling?  No    Is the patient reluctant to leave their home because of a fear of falling?  No      Home Ecologist residence      Prior Function   Level of Independence Independent      Cognition   Overall Cognitive Status Within Functional Limits for tasks assessed      Observation/Other Assessments   Focus on Therapeutic Outcomes (FOTO)   60% limitation      Coordination   Gross Motor Movements are Fluid and Coordinated Yes    Fine Motor Movements are Fluid and Coordinated Yes      Posture/Postural Control   Posture Comments sits slouched, rounded shoulders, forward head; tremor in left hand. Standing, forward trunk flexion, rounded shoulders, holds hands close to body, stands with both  feet ER      ROM / Strength   AROM / PROM / Strength AROM;PROM;Strength      AROM   Overall AROM Comments Lumbar AROM WFL and present except bil rotation 50% limited      PROM   Overall PROM Comments PROM bil hips WNL but increased tightness palpated on R side      Strength   Overall Strength Comments R hip flex 3+/5, L DF 3/5; all others 4+/5      Palpation   Palpation comment L3-5 hypomobile with dynamic motion. In prone T7-9 hypomobile, L1, L2 hypmobile; sacrum even, bil hip flexors/quad tight. No tightness palpated bil Piriformis      Special Tests   Other special tests SLR + for hamstring tightness bil      Ambulation/Gait   Gait Comments slight shuffle noted upon immediate standing but both feet clear the floor                      Objective measurements completed on examination: See above findings.               PT Education - 07/10/20 1533    Education Details Educated pt on posture as it relates to LBP and changing center of gravity/balance when standing. HEP issued of tilt on handwritten paper 2x/day 10 reps each. Pt also has his own HEP he does at home consisting of bridge, trunk rotation, sidelying clam shell    Person(s) Educated Patient    Methods Explanation;Handout    Comprehension Verbalized understanding            PT Short Term Goals - 07/10/20 1545      PT SHORT TERM GOAL #1   Title Pt to be I with initial HEP to assist with pain management    Baseline one exercise issued at eval    Time 3    Period Weeks    Status New    Target Date 07/31/20      PT SHORT TERM GOAL #2    Title pt to verbalize and demo proper posture and lifting mechanics to prevent and reduce low back pain     Baseline unmet    Time 3    Period Weeks    Status New    Target Date 07/31/20             PT Long Term Goals - 07/10/20 1547      PT LONG TERM GOAL #1   Title Pt will be able to maintain neutral spine posture x 10 min to perform functional activities.    Baseline unable    Time 5    Period Weeks    Status New    Target Date 08/14/20      PT LONG TERM GOAL #2   Title pt to be able to sit, stand and walk for >/= 60 min reporting </= 3/10 low back pain for functional mobility/ endurance required for home/recreational tasks    Baseline unable    Time 5    Period Weeks    Status New    Target Date 08/14/20      PT LONG TERM GOAL #3   Title pt to increase FOTO score to </= 41% limited to demo improvement in function     Baseline 60% limited    Time 5    Period Weeks    Status New    Target Date 08/14/20  PT LONG TERM GOAL #4   Title pt to be I with all HEP given as of last visit to maintain and progress current level of function    Baseline unmet    Time 5    Period Weeks    Status New    Target Date 08/14/20                  Plan - 07/10/20 1536    Clinical Impression Statement Pt presents to PT with medical diagnosis of spinal stenosis with neurogenic claudication. Pt presents with hypmobility of L3-5 with dynamic motion. In prone T7-9 hypomobile, L1, L2 hypmobile; sacrum even, bil hip flexors/quad tight. Pt also exhibits poor posture with rounded shoulders, forward trunk flexion, arms tight to sides, and forward head in sitting and standing which is changing his center of mass placing it more over his toes which is affecting his balance. Pt would benefit from PT for postural therex, manual therapy/exercise and modalities for LBP management and spinal mobilizations as tolerated and appropriate, and body mechanics training.    Personal Factors and  Comorbidities Fitness   pt enjoys exercise   Examination-Activity Limitations Lift;Bend;Locomotion Level;Stand;Sit    Examination-Participation Restrictions Cleaning;Community Activity;Yard Work    Merchant navy officer Evolving/Moderate complexity    Clinical Decision Making Moderate    Rehab Potential Good    PT Frequency 2x / week    PT Duration --   5 weeks   PT Treatment/Interventions ADLs/Self Care Home Management;Ultrasound;Moist Heat;Electrical Stimulation;Gait training;Stair training;Functional mobility training;Neuromuscular re-education;Balance training;Therapeutic exercise;Therapeutic activities;Patient/family education;Manual techniques;Dry needling;Passive range of motion;Taping    PT Next Visit Plan Review HEP; progress HEP of core therex, postural education, Hamstring/hip flexor flexibility, pec flexibility, chin tucks, lumbar strengthening    PT Home Exercise Plan issued pelvic tilt supine 2x/day 10 reps    Consulted and Agree with Plan of Care Patient           Patient will benefit from skilled therapeutic intervention in order to improve the following deficits and impairments:  Abnormal gait,Improper body mechanics,Pain,Postural dysfunction,Decreased mobility,Decreased activity tolerance,Decreased endurance,Decreased range of motion,Decreased strength,Hypomobility,Impaired flexibility,Difficulty walking,Decreased balance  Visit Diagnosis: Chronic midline low back pain without sciatica  Abnormal posture  Muscle weakness (generalized)     Problem List Patient Active Problem List   Diagnosis Date Noted  . Retinal vasculitis, bilateral 03/04/2020  . Retinal vein occlusion 01/17/2020  . Presbycusis of right ear 01/17/2020  . 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 Secondary corneal edema of right eye 12/12/2019  . Ptosis of right eyelid 12/12/2019  . OAB (overactive bladder) 11/29/2019  . Bunion of great toe of  right foot 07/14/2017  . Claw toe, acquired, right 07/14/2017  . 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  . Medication management 10/09/2016  . Plantar fasciitis of right foot 02/19/2015  . Depression 08/14/2014  . Family history of heart disease in male family member before age 31 04/26/2014  . Mild cognitive impairment 03/13/2014  . 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 Hyperlipidemia with target LDL less than 100   . Abdominal aortic atherosclerosis ([place])   . Moderate essential hypertension 02/25/2011  . Arthropathy 02/25/2011  . HAV (hallux abducto valgus) 02/25/2011    [name], PT 07/10/2020, 3:57 PM  [facility] [address] [place], [place], [address] Phone: [phone]   Fax:  [phone]  Name: [name] MRN: [id number] Date of Birth: May 12, 1951

## 2020-07-29 ENCOUNTER — Ambulatory Visit: Payer: Medicare Other

## 2020-07-31 ENCOUNTER — Ambulatory Visit: Payer: Medicare Other | Attending: Neurosurgery

## 2020-07-31 ENCOUNTER — Other Ambulatory Visit: Payer: Self-pay

## 2020-07-31 DIAGNOSIS — R293 Abnormal posture: Secondary | ICD-10-CM | POA: Insufficient documentation

## 2020-07-31 DIAGNOSIS — M545 Low back pain, unspecified: Secondary | ICD-10-CM | POA: Insufficient documentation

## 2020-07-31 DIAGNOSIS — M6281 Muscle weakness (generalized): Secondary | ICD-10-CM | POA: Insufficient documentation

## 2020-07-31 DIAGNOSIS — G8929 Other chronic pain: Secondary | ICD-10-CM

## 2020-07-31 NOTE — Therapy (Signed)
[facility] [address] [place], [place], [address] Phone: [phone]   Fax:  [phone]  Physical Therapy Treatment  Patient Details  Name: [name] MRN: [id number] Date of Birth: 1951-02-24 Referring Provider (PT): Conchita Paris, MD   Encounter Date: 07/31/2020   PT End of Session - 07/31/20 1631    Visit Number 2    Number of Visits 10    Date for PT Re-Evaluation 08/14/20    Authorization Type UHC MCR    Progress Note Due on Visit 10    PT Start Time 1530    PT Stop Time 1615    PT Time Calculation (min) 45 min    Activity Tolerance Patient tolerated treatment well;No increased pain    Behavior During Therapy WFL for tasks assessed/performed           Past Medical History:  Diagnosis Date  . Allergy   . Arthritis   . BPH (benign prostatic hyperplasia)   . Complication of anesthesia    Pt. stated he had a reaction that ended in him requiring urinary cath placement  . Diverticulosis   . GERD (gastroesophageal reflux disease)    esophageal spasms  . Glaucoma   . Gout   . Head injury, closed, with concussion   . Hepatitis C    chronic - Has been treated with Harvoni  . HLD (hyperlipidemia)    statin intolerant (Crestor & Simvastatin) - Taking Livalo 1mg  / week  . Hypertension   . Parkinson's disease ([facility])   . Plantar fasciitis    right  . PVD (peripheral vascular disease) ([facility])    With no claudication; only mild abdominal aortic atherosclerosis noted on ultrasound.  . Thoracic ascending aortic aneurysm (HCC)    4.2 cm ascending TAA 09/2016 CT, 1 yr f/u rec  . Ulcer     Past Surgical History:  Procedure Laterality Date  . BUNIONECTOMY WITH WEIL OSTEOTOMY Right 11/02/2019   Procedure: Right Foot Lapidus, Modified McBride Bunionectomy,  Hallux Akin Osteotomy;  Surgeon: 01/02/2020, MD;  Location: [facility];  Service: Orthopedics;  Laterality: Right;  . CARDIAC CATHETERIZATION  2005   30%  Cx. Dr. Toni Arthurs  . cataract surgery Left 11/05/2014  . COLONOSCOPY    . ESOPHAGOGASTRODUODENOSCOPY N/A 05/02/2015   Procedure: ESOPHAGOGASTRODUODENOSCOPY (EGD);  Surgeon: 07/02/2015, MD;  Location: [facility];  Service: Endoscopy;  Laterality: N/A;  . ESOPHAGOGASTRODUODENOSCOPY  05/02/2015   no source of pt chest pain endoscopically evident. small hiatal hernia.  07/02/2015 EYE SURGERY    . HARDWARE REMOVAL Right 11/02/2019   Procedure: Second Metatarsal Removal of Deep Implant and Rotational Osteotomy;  Surgeon: 01/02/2020, MD;  Location: [facility];  Service: Orthopedics;  Laterality: Right;  . MEMBRANE PEEL Left 03/14/2014   Procedure: MEMBRANE PEEL; ENDOLASER;  Surgeon: 03/16/2014, MD;  Location: [facility];  Service: Ophthalmology;  Laterality: Left;  . NM MYOVIEW LTD  2011   Neg Ischemia or infarct.  2012 NM MYOVIEW LTD  10/2015   LOW RISK. Small, fixed basal lateral defect - likely diaphragmatic attenuation. EF 69%  . PARS PLANA VITRECTOMY Left 03/14/2014   Procedure: PARS PLANA VITRECTOMY WITH 25 GAUGE;  Surgeon: 03/16/2014, MD;  Location: [facility];  Service: Ophthalmology;  Laterality: Left;  . TONSILLECTOMY    . TRANSTHORACIC ECHOCARDIOGRAM  2013   Normal EF. No significant Valve Disease  . UPPER GASTROINTESTINAL ENDOSCOPY    . WEIL OSTEOTOMY Right 09/01/2017  Procedure: RIGHT GREAT TOE CHEVRON AND WEIL OSTEOTOMY 2ND METATARSAL;  Surgeon: Newt Minion, MD;  Location: [facility];  Service: Orthopedics;  Laterality: Right;    There were no vitals filed for this visit.   Subjective Assessment - 07/31/20 1536    Subjective Pt verbalizes compliance with HEPP. He states he has LBP upon arrival but no radicular pain at the moment. Pt explains that his pain has eased slightly due to taking pain medication prior to PT appointment. He lifted a trash bag today and reports it was "alright". He states his back was a little stiff, but he did not have significant pain.    Pertinent History  Early onset Parkinsons. Spinal stenosis. cortisone injection in spine 3 weeks ago.    Limitations Sitting;House hold activities;Lifting;Standing;Walking    How long can you sit comfortably? depends; sometimes its an hour. Mornings are most difficult.    How long can you stand comfortably? up to 1 hour    How long can you walk comfortably? 30 min    Diagnostic tests see chart    Patient Stated Goals to be able to move with less pain    Currently in Pain? Yes    Pain Score 6    was 6/10 before taking medication but has eased now   Pain Location Back    Pain Orientation Posterior;Left    Pain Descriptors / Indicators Tightness;Aching    Pain Type Chronic pain    Pain Onset More than a month ago              [facility] PT Assessment - 07/31/20 0001      Assessment   Medical Diagnosis spinal stenosis with neurogenic claudication    Referring Provider (PT) Kathyrn Sheriff, MD    Onset Date/Surgical Date 06/13/20                         [facility] Adult PT Treatment/Exercise - 07/31/20 0001      Exercises   Exercises Lumbar      Lumbar Exercises: Stretches   Passive Hamstring Stretch Right;Left;2 reps;30 seconds    Passive Hamstring Stretch Limitations with green strap    Piriformis Stretch Right;Left;2 reps;30 seconds    Other Lumbar Stretch Exercise Thomas stretch x 1 min each LE      Lumbar Exercises: Aerobic   Nustep L5 x 5 min      Lumbar Exercises: Supine   Pelvic Tilt 15 reps   2 x 15   Bent Knee Raise Limitations Alternating marches with core activation x 30 (15x each LE)      Shoulder Exercises: Standing   Extension Strengthening;Both;20 reps;Theraband    Theraband Level (Shoulder Extension) Level 3 (Green)    Row Strengthening;Both;20 reps;Theraband    Theraband Level (Shoulder Row) Level 3 (Green)      Shoulder Exercises: Stretch   Corner Stretch 4 reps;30 seconds    Corner Stretch Limitations 2x at corner and 2x at doorway                  PT  Education - 07/31/20 1653    Education Details Reviewed and updated HEP, reiterated postural awareness and control, discussed consistency with stretching. Provided pt with TPDN handout and with tennis ball for self STM and myofascial release. Reviewed potential leg length discrepancy vs innominate rotation but will reassess following some stretching/strengthening.    Person(s) Educated Patient    Methods Explanation;Demonstration;Handout;Verbal cues    Comprehension  Verbalized understanding;Returned demonstration;Verbal cues required            PT Short Term Goals - 07/31/20 1538      PT SHORT TERM GOAL #1   Title Pt to be I with initial HEP to assist with pain management    Baseline one exercise issued at eval    Time 3    Period Weeks    Status Achieved    Target Date 07/31/20      PT SHORT TERM GOAL #2   Title pt to verbalize and demo proper posture and lifting mechanics to prevent and reduce low back pain     Baseline unmet    Time 3    Period Weeks    Status On-going    Target Date 07/31/20             PT Long Term Goals - 07/10/20 1547      PT LONG TERM GOAL #1   Title Pt will be able to maintain neutral spine posture x 10 min to perform functional activities.    Baseline unable    Time 5    Period Weeks    Status New    Target Date 08/14/20      PT LONG TERM GOAL #2   Title pt to be able to sit, stand and walk for >/= 60 min reporting </= 3/10 low back pain for functional mobility/ endurance required for home/recreational tasks    Baseline unable    Time 5    Period Weeks    Status New    Target Date 08/14/20      PT LONG TERM GOAL #3   Title pt to increase FOTO score to </= 41% limited to demo improvement in function     Baseline 60% limited    Time 5    Period Weeks    Status New    Target Date 08/14/20      PT LONG TERM GOAL #4   Title pt to be I with all HEP given as of last visit to maintain and progress current level of function    Baseline  unmet    Time 5    Period Weeks    Status New    Target Date 08/14/20                 Plan - 07/31/20 1534    Clinical Impression Statement Patient had good tolerance to therapy session with no adverse effects or complaints of significant increase in pain. He presents with myofascial trigger points along glutes and piriformis as well as significant tightness in B hip flexors during Thomas stretch. Pt's RLE appeared longer in supine and longsitting during long sit test when comparing at medial malleoli, but there was no significant discrepancy noted with measurement from ASIS to medial malleoli bilaterally. Will plan to monitor and reassess for potential leg length discrepancy vs innominate rotation. He will continue to benefit from skilled PT intervention for postural control and reduction of LBP.    Personal Factors and Comorbidities Fitness   pt enjoys exercise   Examination-Activity Limitations Lift;Bend;Locomotion Level;Stand;Sit    Examination-Participation Restrictions Cleaning;Community Activity;Yard Work    Merchant navy officer Evolving/Moderate complexity    Rehab Potential Good    PT Frequency 2x / week    PT Duration --   5 weeks   PT Treatment/Interventions ADLs/Self Care Home Management;Ultrasound;Moist Heat;Electrical Stimulation;Gait training;Stair training;Functional mobility training;Neuromuscular re-education;Balance training;Therapeutic exercise;Therapeutic activities;Patient/family education;Manual techniques;Dry needling;Passive range of  motion;Taping    PT Next Visit Plan Review HEP; progress HEP of core therex, postural education, HS and hip FL flexibility, pec flexibility, chin tucks, lumbar strengthening, piriformis stretch, thoracic EXT over foam roll in chair, periscapular strengthening    PT Home Exercise Plan [id number] - posterior pelvic tilt, marching with PPT, Thomas stretch, HS stretch with strap, doorway stretch, rows (red), shoulder EXT (red),  tennis ball for STM/myofascial release    Consulted and Agree with Plan of Care Patient           Patient will benefit from skilled therapeutic intervention in order to improve the following deficits and impairments:  Abnormal gait,Improper body mechanics,Pain,Postural dysfunction,Decreased mobility,Decreased activity tolerance,Decreased endurance,Decreased range of motion,Decreased strength,Hypomobility,Impaired flexibility,Difficulty walking,Decreased balance  Visit Diagnosis: Chronic midline low back pain without sciatica  Abnormal posture  Muscle weakness (generalized)     Problem List Patient Active Problem List   Diagnosis Date Noted  . Retinal vasculitis, bilateral 03/04/2020  . Retinal vein occlusion 01/17/2020  . Presbycusis of right ear 01/17/2020  . 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 Secondary corneal edema of right eye 12/12/2019  . Ptosis of right eyelid 12/12/2019  . OAB (overactive bladder) 11/29/2019  . Bunion of great toe of right foot 07/14/2017  . Claw toe, acquired, right 07/14/2017  . 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  . Medication management 10/09/2016  . Plantar fasciitis of right foot 02/19/2015  . Depression 08/14/2014  . Family history of heart disease in male family member before age 72 04/26/2014  . Mild cognitive impairment 03/13/2014  . 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 Hyperlipidemia with target LDL less than 100   . Abdominal aortic atherosclerosis ([place])   . Moderate essential hypertension 02/25/2011  . Arthropathy 02/25/2011  . HAV (hallux abducto valgus) 02/25/2011     Haydee Monica, PT, DPT 07/31/20 5:02 PM  [facility] [address] [place], [place], [address] Phone: [phone]   Fax:  [phone]  Name: [name] MRN: [id number] Date of Birth: Mar 10, 1951

## 2020-08-02 MED FILL — TRIHEXYPHENIDYL 2 MG TABLET: 2 | 30 days supply | Qty: 30 | Fill #0

## 2020-08-05 ENCOUNTER — Other Ambulatory Visit: Payer: Self-pay

## 2020-08-05 ENCOUNTER — Ambulatory Visit: Payer: Medicare Other

## 2020-08-05 DIAGNOSIS — R293 Abnormal posture: Secondary | ICD-10-CM

## 2020-08-05 DIAGNOSIS — M6281 Muscle weakness (generalized): Secondary | ICD-10-CM

## 2020-08-05 DIAGNOSIS — G8929 Other chronic pain: Secondary | ICD-10-CM

## 2020-08-05 DIAGNOSIS — M545 Low back pain, unspecified: Secondary | ICD-10-CM | POA: Diagnosis not present

## 2020-08-05 NOTE — Therapy (Signed)
[place], [place], [address] Phone: [phone]   Fax:  [phone]  Physical Therapy Treatment  Patient Details  Name: [name] MRN: [id number] Date of Birth: [birth year] Referring Provider (PT): Kathyrn Sheriff, MD   Encounter Date: 08/05/2020   PT End of Session - 08/05/20 1512    Visit Number 3    Number of Visits 10    Date for PT Re-Evaluation 08/14/20    Authorization Type UHC MCR    Progress Note Due on Visit 10    PT Start Time 1500    PT Stop Time [id number]    PT Time Calculation (min) 44 min    Activity Tolerance Patient tolerated treatment well;No increased pain    Behavior During Therapy WFL for tasks assessed/performed           Past Medical History:  Diagnosis Date  . Allergy   . Arthritis   . BPH (benign prostatic hyperplasia)   . Complication of anesthesia    Pt. stated he had a reaction that ended in him requiring urinary cath placement  . Diverticulosis   . GERD (gastroesophageal reflux disease)    esophageal spasms  . Glaucoma   . Gout   . Head injury, closed, with concussion   . Hepatitis C    chronic - Has been treated with Harvoni  . HLD (hyperlipidemia)    statin intolerant (Crestor & Simvastatin) - Taking Livalo 1mg  / week  . Hypertension   . Parkinson's disease ([place])   . Plantar fasciitis    right  . PVD (peripheral vascular disease) ([place])    With no claudication; only mild abdominal aortic atherosclerosis noted on ultrasound.  . Thoracic ascending aortic aneurysm (HCC)    4.2 cm ascending TAA 09/2016 CT, 1 yr f/u rec  . Ulcer     Past Surgical History:  Procedure Laterality Date  . BUNIONECTOMY WITH WEIL OSTEOTOMY Right 11/02/2019   Procedure: Right Foot Lapidus, Modified McBride Bunionectomy,  Hallux Akin Osteotomy;  Surgeon: Wylene Simmer, MD;  Location: [facility];  Service: Orthopedics;  Laterality: Right;  . CARDIAC CATHETERIZATION  2005   30%  Cx. Dr. Melvern Banker  . cataract surgery Left 11/05/2014  . COLONOSCOPY    . ESOPHAGOGASTRODUODENOSCOPY N/A 05/02/2015   Procedure: ESOPHAGOGASTRODUODENOSCOPY (EGD);  Surgeon: Ronald Lobo, MD;  Location: [facility];  Service: Endoscopy;  Laterality: N/A;  . ESOPHAGOGASTRODUODENOSCOPY  05/02/2015   no source of pt chest pain endoscopically evident. small hiatal hernia.  [name] EYE SURGERY    . HARDWARE REMOVAL Right 11/02/2019   Procedure: Second Metatarsal Removal of Deep Implant and Rotational Osteotomy;  Surgeon: Wylene Simmer, MD;  Location: [facility];  Service: Orthopedics;  Laterality: Right;  . MEMBRANE PEEL Left 03/14/2014   Procedure: MEMBRANE PEEL; ENDOLASER;  Surgeon: Hurman Horn, MD;  Location: [facility];  Service: Ophthalmology;  Laterality: Left;  . NM MYOVIEW LTD  2011   Neg Ischemia or infarct.  [name] NM MYOVIEW LTD  10/2015   LOW RISK. Small, fixed basal lateral defect - likely diaphragmatic attenuation. EF 69%  . PARS PLANA VITRECTOMY Left 03/14/2014   Procedure: PARS PLANA VITRECTOMY WITH 25 GAUGE;  Surgeon: Hurman Horn, MD;  Location: [facility];  Service: Ophthalmology;  Laterality: Left;  . TONSILLECTOMY    . TRANSTHORACIC ECHOCARDIOGRAM  2013   Normal EF. No significant Valve Disease  . UPPER GASTROINTESTINAL ENDOSCOPY    . WEIL OSTEOTOMY Right 09/01/2017  Procedure: RIGHT GREAT TOE CHEVRON AND WEIL OSTEOTOMY 2ND METATARSAL;  Surgeon: Newt Minion, MD;  Location: [facility];  Service: Orthopedics;  Laterality: Right;    There were no vitals filed for this visit.   Subjective Assessment - 08/05/20 1501    Subjective Back pain has been between a 6-9/10 most of the day along the low back and down the Rt leg to the front of thigh. "Wilburn Mylar was a bad day and I just laid around most of the time" Patient reports his pain had not been this bad until he started doing some of his band exercises per his HEP. Patient reports he can't detect much pain right now because he is sitting.     Currently in Pain? No/denies   because he is sitting             [facility] PT Assessment - 08/05/20 0001      Palpation   Palpation comment elevated R iliac crest, ASIS                         [facility] Adult PT Treatment/Exercise - 08/05/20 0001      Self-Care   Self-Care Other Self-Care Comments    Other Self-Care Comments  see patient education      Lumbar Exercises: Stretches   Figure 4 Stretch Limitations 1 min each supine      Lumbar Exercises: Seated   Other Seated Lumbar Exercises repeated trunk flexion 1 x 10    Other Seated Lumbar Exercises stability ball rollout 2 x 10      Lumbar Exercises: Supine   Bridge Limitations 2 x 10    Other Supine Lumbar Exercises SKC 2 x 10    Other Supine Lumbar Exercises LTR 2 min      Lumbar Exercises: Sidelying   Clam Limitations 2 x 10      Manual Therapy   Manual therapy comments muscle energy technique to improve pelvic symmetry, LAD bilaterally                  PT Education - 08/05/20 1524    Education Details Education on centralization phenomenon. Education on lumbar anatomy. Education on updated HEP.    Person(s) Educated Patient    Methods Explanation;Handout    Comprehension Verbalized understanding            PT Short Term Goals - 07/31/20 1538      PT SHORT TERM GOAL #1   Title Pt to be I with initial HEP to assist with pain management    Baseline one exercise issued at eval    Time 3    Period Weeks    Status Achieved    Target Date 07/31/20      PT SHORT TERM GOAL #2   Title pt to verbalize and demo proper posture and lifting mechanics to prevent and reduce low back pain     Baseline unmet    Time 3    Period Weeks    Status On-going    Target Date 07/31/20             PT Long Term Goals - 07/10/20 1547      PT LONG TERM GOAL #1   Title Pt will be able to maintain neutral spine posture x 10 min to perform functional activities.    Baseline unable    Time 5    Period  Weeks    Status New  Target Date 08/14/20      PT LONG TERM GOAL #2   Title pt to be able to sit, stand and walk for >/= 60 min reporting </= 3/10 low back pain for functional mobility/ endurance required for home/recreational tasks    Baseline unable    Time 5    Period Weeks    Status New    Target Date 08/14/20      PT LONG TERM GOAL #3   Title pt to increase FOTO score to </= 41% limited to demo improvement in function     Baseline 60% limited    Time 5    Period Weeks    Status New    Target Date 08/14/20      PT LONG TERM GOAL #4   Title pt to be I with all HEP given as of last visit to maintain and progress current level of function    Baseline unmet    Time 5    Period Weeks    Status New    Target Date 08/14/20                 Plan - 08/05/20 1508    Clinical Impression Statement Patient demonstrates initial positive response to flexion biased exercises as this helps to reduce his intermittent low back pain during session. Patient recommended to complete repeated trunk flexion at home when pain levels are increased to determine effect of repeated movement on lasting pain reduction as his pain levels were minimal during today's session rated at most 2/10. Patient demonstrates elevated Rt iliac crest and ASIS in standing that is easily corrected with muscle energy technique.    Personal Factors and Comorbidities Fitness   pt enjoys exercise   Examination-Activity Limitations Lift;Bend;Locomotion Level;Stand;Sit    Examination-Participation Restrictions Cleaning;Community Activity;Yard Work    Conservation officer, historic buildingstability/Clinical Decision Making Evolving/Moderate complexity    Rehab Potential Good    PT Frequency 2x / week    PT Duration --   5 weeks   PT Treatment/Interventions ADLs/Self Care Home Management;Ultrasound;Moist Heat;Electrical Stimulation;Gait training;Stair training;Functional mobility training;Neuromuscular re-education;Balance training;Therapeutic  exercise;Therapeutic activities;Patient/family education;Manual techniques;Dry needling;Passive range of motion;Taping    PT Next Visit Plan Progress HEP. Assess response to repeated flexion. Core/hip strengthening. Hip flexibility    PT Home Exercise Plan [email or web address] - posterior pelvic tilt, marching with PPT, Thomas stretch, HS stretch with strap, doorway stretch, rows (red), shoulder EXT (red), tennis ball for STM/myofascial release    Consulted and Agree with Plan of Care Patient           Patient will benefit from skilled therapeutic intervention in order to improve the following deficits and impairments:  Abnormal gait,Improper body mechanics,Pain,Postural dysfunction,Decreased mobility,Decreased activity tolerance,Decreased endurance,Decreased range of motion,Decreased strength,Hypomobility,Impaired flexibility,Difficulty walking,Decreased balance  Visit Diagnosis: Chronic midline low back pain without sciatica  Abnormal posture  Muscle weakness (generalized)     Problem List Patient Active Problem List   Diagnosis Date Noted  . Retinal vasculitis, bilateral 03/04/2020  . Retinal vein occlusion 01/17/2020  . Presbycusis of right ear 01/17/2020  . 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 Secondary corneal edema of right eye 12/12/2019  . Ptosis of right eyelid 12/12/2019  . OAB (overactive bladder) 11/29/2019  . Bunion of great toe of right foot 07/14/2017  . Claw toe, acquired, right 07/14/2017  . 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  . Medication management 10/09/2016  . Plantar fasciitis of right foot  02/19/2015  . Depression 08/14/2014  . Family history of heart disease in male family member before age 67 04/26/2014  . Mild cognitive impairment 03/13/2014  . 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 Hyperlipidemia with target LDL less than 100   . Abdominal aortic atherosclerosis ([place])   .  Moderate essential hypertension 02/25/2011  . Arthropathy 02/25/2011  . HAV (hallux abducto valgus) 02/25/2011   [name] Grant, PT, DPT, ATC 08/05/20 3:47 PM  [facility] [address] [place], [place], [address] Phone: [phone]   Fax:  [phone]  Name: [name] MRN: [id number] Date of Birth: [birth year]

## 2020-08-07 ENCOUNTER — Other Ambulatory Visit: Payer: Self-pay

## 2020-08-07 ENCOUNTER — Ambulatory Visit: Payer: Medicare Other

## 2020-08-07 DIAGNOSIS — R293 Abnormal posture: Secondary | ICD-10-CM

## 2020-08-07 DIAGNOSIS — M6281 Muscle weakness (generalized): Secondary | ICD-10-CM

## 2020-08-07 DIAGNOSIS — M545 Low back pain, unspecified: Secondary | ICD-10-CM | POA: Diagnosis not present

## 2020-08-07 DIAGNOSIS — G8929 Other chronic pain: Secondary | ICD-10-CM | POA: Diagnosis not present

## 2020-08-07 NOTE — Therapy (Signed)
[place], [place], [address] Phone: [phone]   Fax:  [phone]  Physical Therapy Treatment  Patient Details  Name: [name] MRN: [id number] Date of Birth: 11-Nov-1950 Referring Provider (PT): Kathyrn Sheriff, MD   Encounter Date: 08/07/2020   PT End of Session - 08/07/20 1521    Visit Number 4    Number of Visits 10    Date for PT Re-Evaluation 08/14/20    Authorization Type UHC MCR    Progress Note Due on Visit 10    PT Start Time 1502    PT Stop Time 1545    PT Time Calculation (min) 43 min    Activity Tolerance Patient tolerated treatment well    Behavior During Therapy [facility] for tasks assessed/performed           Past Medical History:  Diagnosis Date  . Allergy   . Arthritis   . BPH (benign prostatic hyperplasia)   . Complication of anesthesia    Pt. stated he had a reaction that ended in him requiring urinary cath placement  . Diverticulosis   . GERD (gastroesophageal reflux disease)    esophageal spasms  . Glaucoma   . Gout   . Head injury, closed, with concussion   . Hepatitis C    chronic - Has been treated with Harvoni  . HLD (hyperlipidemia)    statin intolerant (Crestor & Simvastatin) - Taking Livalo 1mg  / week  . Hypertension   . Parkinson's disease ([place])   . Plantar fasciitis    right  . PVD (peripheral vascular disease) ([place])    With no claudication; only mild abdominal aortic atherosclerosis noted on ultrasound.  . Thoracic ascending aortic aneurysm (HCC)    4.2 cm ascending TAA 09/2016 CT, 1 yr f/u rec  . Ulcer     Past Surgical History:  Procedure Laterality Date  . BUNIONECTOMY WITH WEIL OSTEOTOMY Right 11/02/2019   Procedure: Right Foot Lapidus, Modified McBride Bunionectomy,  Hallux Akin Osteotomy;  Surgeon: Wylene Simmer, MD;  Location: [facility];  Service: Orthopedics;  Laterality: Right;  . CARDIAC CATHETERIZATION  2005   30% Cx. Dr. Melvern Banker   . cataract surgery Left 11/05/2014  . COLONOSCOPY    . ESOPHAGOGASTRODUODENOSCOPY N/A 05/02/2015   Procedure: ESOPHAGOGASTRODUODENOSCOPY (EGD);  Surgeon: Ronald Lobo, MD;  Location[facility];  Service: Endoscopy;  Laterality: N/A;  . ESOPHAGOGASTRODUODENOSCOPY  05/02/2015   no source of pt chest pain endoscopically evident. small hiatal hernia.  [name] EYE SURGERY    . HARDWARE REMOVAL Right 11/02/2019   Procedure: Second Metatarsal Removal of Deep Implant and Rotational Osteotomy;  Surgeon: Wylene Simmer, MD;  Location: [facility];  Service: Orthopedics;  Laterality: Right;  . MEMBRANE PEEL Left 03/14/2014   Procedure: MEMBRANE PEEL; ENDOLASER;  Surgeon: Hurman Horn, MD;  Location: [facility];  Service: Ophthalmology;  Laterality: Left;  . NM MYOVIEW LTD  2011   Neg Ischemia or infarct.  [name] NM MYOVIEW LTD  10/2015   LOW RISK. Small, fixed basal lateral defect - likely diaphragmatic attenuation. EF 69%  . PARS PLANA VITRECTOMY Left 03/14/2014   Procedure: PARS PLANA VITRECTOMY WITH 25 GAUGE;  Surgeon: Hurman Horn, MD;  Location: [facility];  Service: Ophthalmology;  Laterality: Left;  . TONSILLECTOMY    . TRANSTHORACIC ECHOCARDIOGRAM  2013   Normal EF. No significant Valve Disease  . UPPER GASTROINTESTINAL ENDOSCOPY    . WEIL OSTEOTOMY Right 09/01/2017  Procedure: RIGHT GREAT TOE CHEVRON AND WEIL OSTEOTOMY 2ND METATARSAL;  Surgeon: Newt Minion, MD;  Location: [facility];  Service: Orthopedics;  Laterality: Right;    There were no vitals filed for this visit.   Subjective Assessment - 08/07/20 1504    Subjective Patient reports it's been pretty rough and his pain has been present pretty much all day across the base of his back and his Rt hip.    Pertinent History Early onset Parkinsons. Spinal stenosis. cortisone injection in spine 3 weeks ago.    Currently in Pain? Yes    Pain Score 5     Pain Location Back    Pain Orientation Posterior    Pain Descriptors / Indicators Other  (Comment)   pulling   Pain Type Chronic pain    Pain Onset More than a month ago    Pain Frequency Intermittent              [facility] PT Assessment - 08/07/20 0001      Palpation   Palpation comment symmtrical pelvic alignment                         [facility] Adult PT Treatment/Exercise - 08/07/20 0001      Self-Care   Other Self-Care Comments  see patient education      Lumbar Exercises: Seated   Other Seated Lumbar Exercises stability ball rollout 2 min      Lumbar Exercises: Supine   Pelvic Tilt Limitations posterior pelvic tilt 2 x 10    Clam Limitations 2 x 15 green TB    Other Supine Lumbar Exercises 90/90 hold 5 minutes    Other Supine Lumbar Exercises --      Manual Therapy   Manual therapy comments STM/DTM to lumbar paraspinals, QL                  PT Education - 08/07/20 1542    Education Details Updated HEP. Education on lumbar anatomy and current condition.    Person(s) Educated Patient    Methods Explanation;Handout    Comprehension Verbalized understanding;Returned demonstration            PT Short Term Goals - 07/31/20 1538      PT SHORT TERM GOAL #1   Title Pt to be I with initial HEP to assist with pain management    Baseline one exercise issued at eval    Time 3    Period Weeks    Status Achieved    Target Date 07/31/20      PT SHORT TERM GOAL #2   Title pt to verbalize and demo proper posture and lifting mechanics to prevent and reduce low back pain     Baseline unmet    Time 3    Period Weeks    Status On-going    Target Date 07/31/20             PT Long Term Goals - 07/10/20 1547      PT LONG TERM GOAL #1   Title Pt will be able to maintain neutral spine posture x 10 min to perform functional activities.    Baseline unable    Time 5    Period Weeks    Status New    Target Date 08/14/20      PT LONG TERM GOAL #2   Title pt to be able to sit, stand and walk for >/= 60 min reporting </=  3/10 low back  pain for functional mobility/ endurance required for home/recreational tasks    Baseline unable    Time 5    Period Weeks    Status New    Target Date 08/14/20      PT LONG TERM GOAL #3   Title pt to increase FOTO score to </= 41% limited to demo improvement in function     Baseline 60% limited    Time 5    Period Weeks    Status New    Target Date 08/14/20      PT LONG TERM GOAL #4   Title pt to be I with all HEP given as of last visit to maintain and progress current level of function    Baseline unmet    Time 5    Period Weeks    Status New    Target Date 08/14/20                 Plan - 08/07/20 1538    Clinical Impression Statement Patient demonstrates symmetrical pelvic alignment at beginning of session. Patient reports a reduction in pain with 90/90 positional hold reporting pain as 2/10 in the low back following this sustained position. Able to progress core and hip strengthening without increased reports of low back pain.    Personal Factors and Comorbidities Fitness   pt enjoys exercise   Examination-Activity Limitations Lift;Bend;Locomotion Level;Stand;Sit    Examination-Participation Restrictions Cleaning;Community Activity;Yard Work    Merchant navy officer Evolving/Moderate complexity    Rehab Potential Good    PT Frequency 2x / week    PT Duration --   5 weeks   PT Treatment/Interventions ADLs/Self Care Home Management;Ultrasound;Moist Heat;Electrical Stimulation;Gait training;Stair training;Functional mobility training;Neuromuscular re-education;Balance training;Therapeutic exercise;Therapeutic activities;Patient/family education;Manual techniques;Dry needling;Passive range of motion;Taping    PT Next Visit Plan Progress core and hip strengthening as tolerated. Hip flexibility.    PT Home Exercise Plan [email or web address]    Consulted and Agree with Plan of Care Patient           Patient will benefit from skilled therapeutic intervention in order to  improve the following deficits and impairments:  Abnormal gait,Improper body mechanics,Pain,Postural dysfunction,Decreased mobility,Decreased activity tolerance,Decreased endurance,Decreased range of motion,Decreased strength,Hypomobility,Impaired flexibility,Difficulty walking,Decreased balance  Visit Diagnosis: Chronic midline low back pain without sciatica  Abnormal posture  Muscle weakness (generalized)     Problem List Patient Active Problem List   Diagnosis Date Noted  . Retinal vasculitis, bilateral 03/04/2020  . Retinal vein occlusion 01/17/2020  . Presbycusis of right ear 01/17/2020  . 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 Secondary corneal edema of right eye 12/12/2019  . Ptosis of right eyelid 12/12/2019  . OAB (overactive bladder) 11/29/2019  . Bunion of great toe of right foot 07/14/2017  . Claw toe, acquired, right 07/14/2017  . 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  . Medication management 10/09/2016  . Plantar fasciitis of right foot 02/19/2015  . Depression 08/14/2014  . Family history of heart disease in male family member before age 54 04/26/2014  . Mild cognitive impairment 03/13/2014  . 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 Hyperlipidemia with target LDL less than 100   . Abdominal aortic atherosclerosis ([place])   . Moderate essential hypertension 02/25/2011  . Arthropathy 02/25/2011  . HAV (hallux abducto valgus) 02/25/2011   [name] Grant, PT, DPT, ATC 08/07/20 4:19 PM [place], [place],  [address] Phone: [phone]   Fax:  [phone]  Name: [name] MRN: [id number] Date of Birth: 05/31/1951

## 2020-08-08 DIAGNOSIS — H34832 Tributary (branch) retinal vein occlusion, left eye, with macular edema: Secondary | ICD-10-CM | POA: Diagnosis not present

## 2020-08-08 DIAGNOSIS — Z961 Presence of intraocular lens: Secondary | ICD-10-CM | POA: Diagnosis not present

## 2020-08-08 DIAGNOSIS — H31093 Other chorioretinal scars, bilateral: Secondary | ICD-10-CM | POA: Diagnosis not present

## 2020-08-08 DIAGNOSIS — H4720 Unspecified optic atrophy: Secondary | ICD-10-CM | POA: Diagnosis not present

## 2020-08-08 DIAGNOSIS — H35372 Puckering of macula, left eye: Secondary | ICD-10-CM | POA: Diagnosis not present

## 2020-08-08 DIAGNOSIS — H04123 Dry eye syndrome of bilateral lacrimal glands: Secondary | ICD-10-CM | POA: Diagnosis not present

## 2020-08-08 DIAGNOSIS — H401132 Primary open-angle glaucoma, bilateral, moderate stage: Secondary | ICD-10-CM | POA: Diagnosis not present

## 2020-08-08 DIAGNOSIS — H34812 Central retinal vein occlusion, left eye, with macular edema: Secondary | ICD-10-CM | POA: Diagnosis not present

## 2020-08-10 MED FILL — IRBESARTAN 300 MG TABS: 300 | 30 days supply | Qty: 30 | Fill #5

## 2020-08-12 ENCOUNTER — Ambulatory Visit: Payer: Medicare Other

## 2020-08-13 MED FILL — VERAPAMIL HCL ER 240 MG TBC: 240 | 90 days supply | Qty: 90 | Fill #2

## 2020-08-14 ENCOUNTER — Other Ambulatory Visit: Payer: Self-pay | Admitting: Physician Assistant

## 2020-08-14 ENCOUNTER — Ambulatory Visit: Payer: Medicare Other

## 2020-08-14 ENCOUNTER — Telehealth: Payer: Self-pay | Admitting: Neurology

## 2020-08-14 ENCOUNTER — Other Ambulatory Visit: Payer: Self-pay

## 2020-08-14 DIAGNOSIS — M48062 Spinal stenosis, lumbar region with neurogenic claudication: Secondary | ICD-10-CM

## 2020-08-14 DIAGNOSIS — M545 Low back pain, unspecified: Secondary | ICD-10-CM

## 2020-08-14 DIAGNOSIS — M6281 Muscle weakness (generalized): Secondary | ICD-10-CM

## 2020-08-14 DIAGNOSIS — R293 Abnormal posture: Secondary | ICD-10-CM | POA: Diagnosis not present

## 2020-08-14 DIAGNOSIS — G8929 Other chronic pain: Secondary | ICD-10-CM | POA: Diagnosis not present

## 2020-08-14 NOTE — Therapy (Signed)
[place], [place], [address] Phone: [phone]   Fax:  [phone]  Physical Therapy Treatment/Progress Note Re-certification  Progress Note Reporting Period 07/10/20 to 08/14/2020   See note below for Objective Data and Assessment of Progress/Goals.       Patient Details  Name: [name] MRN: [id number] Date of Birth: 1950/09/13 Referring Provider (PT): Kathyrn Sheriff, MD   Encounter Date: 08/14/2020   PT End of Session - 08/14/20 1509    Visit Number 5    Number of Visits 10    Date for PT Re-Evaluation 09/14/20    Authorization Type [employer] MCR    Progress Note Due on Visit 15    PT Start Time 1503    PT Stop Time 1545    PT Time Calculation (min) 42 min    Activity Tolerance Patient tolerated treatment well    Behavior During Therapy [facility] for tasks assessed/performed           Past Medical History:  Diagnosis Date  . Allergy   . Arthritis   . BPH (benign prostatic hyperplasia)   . Complication of anesthesia    Pt. stated he had a reaction that ended in him requiring urinary cath placement  . Diverticulosis   . GERD (gastroesophageal reflux disease)    esophageal spasms  . Glaucoma   . Gout   . Head injury, closed, with concussion   . Hepatitis C    chronic - Has been treated with Harvoni  . HLD (hyperlipidemia)    statin intolerant (Crestor & Simvastatin) - Taking Livalo 69m / week  . Hypertension   . Parkinson's disease ([facility]   . Plantar fasciitis    right  . PVD (peripheral vascular disease) ([facility]    With no claudication; only mild abdominal aortic atherosclerosis noted on ultrasound.  . Thoracic ascending aortic aneurysm (HCC)    4.2 cm ascending TAA 09/2016 CT, 1 yr f/u rec  . Ulcer     Past Surgical History:  Procedure Laterality Date  . BUNIONECTOMY WITH WEIL OSTEOTOMY Right 11/02/2019   Procedure: Right Foot Lapidus, Modified McBride Bunionectomy,  Hallux Akin  Osteotomy;  Surgeon: HWylene Simmer MD;  [place] [facility]  Service: Orthopedics;  Laterality: Right;  . CARDIAC CATHETERIZATION  2005   30% Cx. Dr. GMelvern Banker . cataract surgery Left 11/05/2014  . COLONOSCOPY    . ESOPHAGOGASTRODUODENOSCOPY N/A 05/02/2015   Procedure: ESOPHAGOGASTRODUODENOSCOPY (EGD);  Surgeon: RRonald Lobo MD;  [facility];  Service: Endoscopy;  Laterality: N/A;  . ESOPHAGOGASTRODUODENOSCOPY  05/02/2015   no source of pt chest pain endoscopically evident. small hiatal hernia.  .Marland KitchenEYE SURGERY    . HARDWARE REMOVAL Right 11/02/2019   Procedure: Second Metatarsal Removal of Deep Implant and Rotational Osteotomy;  Surgeon: HWylene Simmer MD;  [place] [facility]  Service: Orthopedics;  Laterality: Right;  . MEMBRANE PEEL Left 03/14/2014   Procedure: MEMBRANE PEEL; ENDOLASER;  Surgeon: GHurman Horn MD;  [place] [facility]  Service: Ophthalmology;  Laterality: Left;  . NM MYOVIEW LTD  2011   Neg Ischemia or infarct.  .Marland KitchenNM MYOVIEW LTD  10/2015   LOW RISK. Small, fixed basal lateral defect - likely diaphragmatic attenuation. EF 69%  . PARS PLANA VITRECTOMY Left 03/14/2014   Procedure: PARS PLANA VITRECTOMY WITH 25 GAUGE;  Surgeon: GHurman Horn MD;  [place] [facility]  Service: Ophthalmology;  Laterality: Left;  . TONSILLECTOMY    .  TRANSTHORACIC ECHOCARDIOGRAM  2013   Normal EF. No significant Valve Disease  . UPPER GASTROINTESTINAL ENDOSCOPY    . WEIL OSTEOTOMY Right 09/01/2017   Procedure: RIGHT GREAT TOE CHEVRON AND WEIL OSTEOTOMY 2ND METATARSAL;  Surgeon: Newt Minion, MD;  Location: [place];  Service: Orthopedics;  Laterality: Right;    There were no vitals filed for this visit.   Subjective Assessment - 08/14/20 1506    Subjective Patient reports his back is killing him. Patient reports some relief of pain with sitting, though otherwise his pain is pretty intense. He reports the pain is more intense in the mornings and with lifting.  He has another injection scheduled for next week.    Pertinent History Early onset Parkinsons. Spinal stenosis. cortisone injection in spine 3 weeks ago.    How long can you sit comfortably? comfortably about an hour    How long can you stand comfortably? "I'm not doing a lot of standing"    How long can you walk comfortably? "I'm not doing much walking outside of the house"    Diagnostic tests see chart    Patient Stated Goals to be able to move with less pain    Currently in Pain? Yes    Pain Score 4     Pain Location Back    Pain Orientation Posterior    Pain Descriptors / Indicators --   grabbing/pulling   Pain Type Chronic pain    Pain Onset More than a month ago    Pain Frequency Intermittent              [facility] PT Assessment - 08/14/20 0001      Posture/Postural Control   Posture Comments sits slouched, rounded shoulders, forward head; tremor in left hand. Standing: forward trunk flexion, rounded shoulders      AROM   Overall AROM Comments Lumbar AROM WFL with exception of rotation 50% limited bilaterally   pain with extension     Strength   Overall Strength Comments bilateral LE strength grossly 4+/5 with exception of hip abduction and extension 4-/5 bilaterally      Palpation   Palpation comment symmetrical alignment      Special Tests   Other special tests (-) SLR bilaterally                         [facility] Adult PT Treatment/Exercise - 08/14/20 0001      Self-Care   Other Self-Care Comments  see patient education      Lumbar Exercises: Stretches   Single Knee to Chest Stretch Limitations 90 sec each    Other Lumbar Stretch Exercise LTR with figure 4 2 min each      Lumbar Exercises: Aerobic   Nustep L6 x 5 min      Lumbar Exercises: Seated   Other Seated Lumbar Exercises seated march 2 x 20      Lumbar Exercises: Supine   Bridge Limitations 2 x 10                  PT Education - 08/14/20 1515    Education Details Education on  overall progress and POC moving forward    Person(s) Educated Patient    Methods Explanation    Comprehension Verbalized understanding            PT Short Term Goals - 08/14/20 1514      PT SHORT TERM GOAL #1   Title Pt to  be I with initial HEP to assist with pain management    Baseline one exercise issued at eval    Time 3    Period Weeks    Status Achieved    Target Date 07/31/20      PT SHORT TERM GOAL #2   Title pt to verbalize and demo proper posture and lifting mechanics to prevent and reduce low back pain     Baseline unmet    Time 3    Period Weeks    Status On-going    Target Date 07/31/20             PT Long Term Goals - 08/14/20 1514      PT LONG TERM GOAL #1   Title Pt will be able to maintain neutral spine posture x 10 min to perform functional activities.    Baseline unable    Time 5    Period Weeks    Status On-going      PT LONG TERM GOAL #2   Title pt to be able to sit, stand and walk for >/= 60 min reporting </= 3/10 low back pain for functional mobility/ endurance required for home/recreational tasks    Baseline unable    Time 5    Period Weeks    Status Not Met      PT LONG TERM GOAL #3   Title pt to increase FOTO score to </= 41% limited to demo improvement in function     Baseline 60% limited    Time 5    Period Weeks    Status Not Met      PT LONG TERM GOAL #4   Title pt to be I with all HEP given as of last visit to maintain and progress current level of function    Baseline unmet    Time 5    Period Weeks    Status On-going                 Plan - 08/14/20 1532    Clinical Impression Statement Patient has attended 5 PT sessions since the start of care on 07/10/20 with his low back pain remaining relatively unchanged since the start of care.  Patient does not appear to have a directional preference for pain relief though he reports significant increased pain with standing/walking activity. He has another lumbar injection  scheduled on 08/20/20 and has previously experienced pain relief from injections. While patient's LE strength has much improved he has lingering weakness in bilateral hip extensors/abductors as well as postural deficits that remain. It is recommended that PT be placed on hold until his lumbar injection with plans to resume PT once weekly for an additional month following injection in order to progress into standing/functional activity, though if his pain levels remain unchanged following injection will plan to refer back to physician.    Personal Factors and Comorbidities Fitness   pt enjoys exercise   Examination-Activity Limitations Lift;Bend;Locomotion Level;Stand;Sit    Examination-Participation Restrictions Cleaning;Community Activity;Yard Work    [employer] Evolving/Moderate complexity    Rehab Potential Good    PT Frequency 1x / week    PT Duration 4 weeks   5 weeks   PT Treatment/Interventions ADLs/Self Care Home Management;Ultrasound;Moist Heat;Electrical Stimulation;Gait training;Stair training;Functional mobility training;Neuromuscular re-education;Balance training;Therapeutic exercise;Therapeutic activities;Patient/family education;Manual techniques;Dry needling;Passive range of motion;Taping    PT Next Visit Plan Progress core and hip strengthening as tolerated. Hip flexibility.    [place]  Consulted and Agree with Plan of Care Patient           Patient will benefit from skilled therapeutic intervention in order to improve the following deficits and impairments:  Abnormal gait,Improper body mechanics,Pain,Postural dysfunction,Decreased mobility,Decreased activity tolerance,Decreased endurance,Decreased range of motion,Decreased strength,Hypomobility,Impaired flexibility,Difficulty walking,Decreased balance  Visit Diagnosis: Chronic midline low back pain without sciatica  Abnormal posture  Muscle weakness  (generalized)     Problem List Patient Active Problem List   Diagnosis Date Noted  . Retinal vasculitis, bilateral 03/04/2020  . Retinal vein occlusion 01/17/2020  . Presbycusis of right ear 01/17/2020  . 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 Secondary corneal edema of right eye 12/12/2019  . Ptosis of right eyelid 12/12/2019  . OAB (overactive bladder) 11/29/2019  . Bunion of great toe of right foot 07/14/2017  . Claw toe, acquired, right 07/14/2017  . 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  . Medication management 10/09/2016  . Plantar fasciitis of right foot 02/19/2015  . Depression 08/14/2014  . Family history of heart disease in male family member before age 8 04/26/2014  . Mild cognitive impairment 03/13/2014  . 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 Hyperlipidemia with target LDL less than 100   . Abdominal aortic atherosclerosis ([place])   . Moderate essential hypertension 02/25/2011  . Arthropathy 02/25/2011  . HAV (hallux abducto valgus) 02/25/2011    Crissie Figures [place] Fickling 08/14/2020, 3:58 PM  [facility] [address] [place], [place], [address] Phone: [phone]   Fax:  [phone]  Name: [name] MRN: [id number] Date of Birth: 1951/01/09

## 2020-08-14 NOTE — Telephone Encounter (Signed)
Pt called, having more tremors since last appt. Believe side effects from trihexyphenidyl (ARTANE) 2 MG tablet. Would like a call from the nurse.

## 2020-08-14 NOTE — Telephone Encounter (Signed)
Patient states since increasing the Artane at his last visit, he's noticed an increase in the tremors in both arm, left slightly more than right. His wife added that she feels his memory has declined more as well. Please advise on what you would like for them to do?

## 2020-08-14 NOTE — Telephone Encounter (Signed)
Ask the patient to go back to the previous dose of Artane and see if there is improvement

## 2020-08-14 NOTE — Addendum Note (Signed)
Addended by: Edwin Cap on: 08/14/2020 04:07 PM   Modules accepted: Orders

## 2020-08-15 ENCOUNTER — Ambulatory Visit (INDEPENDENT_AMBULATORY_CARE_PROVIDER_SITE_OTHER): Payer: Medicare Other | Admitting: Ophthalmology

## 2020-08-15 ENCOUNTER — Encounter (INDEPENDENT_AMBULATORY_CARE_PROVIDER_SITE_OTHER): Payer: Self-pay | Admitting: Ophthalmology

## 2020-08-15 DIAGNOSIS — H35063 Retinal vasculitis, bilateral: Secondary | ICD-10-CM

## 2020-08-15 DIAGNOSIS — H34812 Central retinal vein occlusion, left eye, with macular edema: Secondary | ICD-10-CM

## 2020-08-15 DIAGNOSIS — H04123 Dry eye syndrome of bilateral lacrimal glands: Secondary | ICD-10-CM

## 2020-08-15 DIAGNOSIS — H18231 Secondary corneal edema, right eye: Secondary | ICD-10-CM | POA: Diagnosis not present

## 2020-08-15 NOTE — Assessment & Plan Note (Addendum)
This condition is most likely responsible for his symptoms of beginning to read without difficulty and then later on developing blurred vision.  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  Patient instructed he may use artificial tears on a as needed basis while reading for extended period of times so as to prevent drying with the lack of blinking with normal attention given to reading.

## 2020-08-15 NOTE — Patient Instructions (Signed)
Patient instructed to contact the office promptly for new onset visual acuity declines or distortions 

## 2020-08-15 NOTE — Assessment & Plan Note (Signed)
Moderate corneal decompensation, secondary to multiple ocular procedures, as well as chronic indwelling tube shunt

## 2020-08-15 NOTE — Telephone Encounter (Signed)
Called and spoke with pt. He will go back to taking Artane 2mg  (1/2 tab po qod). He will call early next week if symptoms have not improved.

## 2020-08-15 NOTE — Assessment & Plan Note (Addendum)
Stable with no active CME, off therapy now for years  Essentially macular and retinal perfusion deficits can lead to visual field deficits.  Patient reports having visual field performed with [facility].  This could explain how things can rapidly appear in front of his visual space while driving.

## 2020-08-15 NOTE — Progress Notes (Signed)
08/15/2020     CHIEF COMPLAINT Patient presents for Retina Follow Up (WIP-Vision change OS-ref'd by [facility] (9 WK FU OS)///Pt reports vision not as clear OS, pt denies any new F/F OS, pt denies any pain or pressure OS. )   HISTORY OF PRESENT ILLNESS: [name] is a 70 y.o. male who presents to the clinic today for:   HPI    Retina Follow Up    Patient presents with  Other.  In left eye.  This started 9 weeks ago.  Duration of 9 weeks.  Since onset it is gradually worsening. Additional comments: WIP-Vision change OS-ref'd by [facility] (9 WK FU OS)   Pt reports vision not as clear OS, pt denies any new F/F OS, pt denies any pain or pressure OS.           Comments    Major complaint OS blurry some of the time NVA and DVA.        Last edited by Hurman Horn, MD on 08/15/2020 10:38 AM. (History)      Referring physician: Denita Lung, MD [place],  [place] [id number]  HISTORICAL INFORMATION:   Selected notes from the MEDICAL RECORD NUMBER       CURRENT MEDICATIONS: Current Outpatient Medications (Ophthalmic Drugs)  Medication Sig  . timolol (TIMOPTIC) 0.5 % ophthalmic solution Place 1 drop into the left eye daily.    No current facility-administered medications for this visit. (Ophthalmic Drugs)   Current Outpatient Medications (Other)  Medication Sig  . Acetaminophen (ACETAMIN PO) Take 500 mg by mouth every 4 (four) hours as needed (pain).   [name] alfuzosin (UROXATRAL) 10 MG 24 hr tablet Take 10 mg by mouth at bedtime.   . calcium-vitamin D (OSCAL WITH D) 250-125 MG-UNIT tablet Take 1 tablet by mouth daily.  . carbidopa-levodopa (SINEMET IR) 25-100 MG tablet Take 1.5 tablets by mouth 3 (three) times daily.  . citalopram (CELEXA) 20 MG tablet Take 1 tablet (20 mg total) by mouth daily.  [name] docusate sodium (COLACE) 100 MG capsule Take 1 capsule (100 mg total) by mouth daily as needed.  . irbesartan (AVAPRO) 300 MG tablet TAKE 1 TABLET BY MOUTH EVERY  MORNING  . LIVALO 1 MG TABS TAKE 1 TABLET (1 MG TOTAL) BY MOUTH 2 TIMES A WEEK. TAKES ON MONDAY AND FRIDAY  . Multiple Vitamin (MULTIVITAMIN WITH MINERALS) TABS tablet Take 1 tablet by mouth 4 (four) times a week.   . nitroGLYCERIN (NITROSTAT) 0.4 MG SL tablet PLACE 1 TABLET UNDER THE TONGUE EVERY 5 MINUTES AS NEEDED FOR CHEST PAIN.  [name] omega-3 acid ethyl esters (LOVAZA) 1 g capsule Take by mouth 2 (two) times daily.  . pantoprazole (PROTONIX) 40 MG tablet Take 40 mg by mouth 2 (two) times daily.  [name] senna (SENOKOT) 8.6 MG TABS tablet Take 2 tablets (17.2 mg total) by mouth 2 (two) times daily.  . trihexyphenidyl (ARTANE) 2 MG tablet TAKE 1/2 TABLET BY MOUTH TWO TIMES DAILY WITH MEALS  . verapamil (CALAN-SR) 240 MG CR tablet TAKE 1 TABLET (240 MG TOTAL) BY MOUTH AT BEDTIME.   No current facility-administered medications for this visit. (Other)      REVIEW OF SYSTEMS:    ALLERGIES No Known Allergies  PAST MEDICAL HISTORY Past Medical History:  Diagnosis Date  . Allergy   . Arthritis   . BPH (benign prostatic hyperplasia)   . Complication of anesthesia    Pt. stated he had a reaction that  ended in him requiring urinary cath placement  . Diverticulosis   . GERD (gastroesophageal reflux disease)    esophageal spasms  . Glaucoma   . Gout   . Head injury, closed, with concussion   . Hepatitis C    chronic - Has been treated with Harvoni  . HLD (hyperlipidemia)    statin intolerant (Crestor & Simvastatin) - Taking Livalo 1mg  / week  . Hypertension   . Parkinson's disease ([place])   . Plantar fasciitis    right  . PVD (peripheral vascular disease) ([place])    With no claudication; only mild abdominal aortic atherosclerosis noted on ultrasound.  . Thoracic ascending aortic aneurysm (H[facility])    4.2 cm ascending TAA 09/2016 CT, 1 yr f/u rec  . Ulcer    Past Surgical History:  Procedure Laterality Date  . BUNIONECTOMY WITH WEIL OSTEOTOMY Right 11/02/2019   Procedure: Right Foot Lapidus,  Modified McBride Bunionectomy,  Hallux Akin Osteotomy;  Surgeon: Wylene Simmer, MD;  Location: [facility];  Service: Orthopedics;  Laterality: Right;  . CARDIAC CATHETERIZATION  2005   30% Cx. Dr. Melvern Banker  . cataract surgery Left 11/05/2014  . COLONOSCOPY    . ESOPHAGOGASTRODUODENOSCOPY N/A 05/02/2015   Procedure: ESOPHAGOGASTRODUODENOSCOPY (EGD);  Surgeon: Ronald Lobo, MD;  Location: [facility];  Service: Endoscopy;  Laterality: N/A;  . ESOPHAGOGASTRODUODENOSCOPY  05/02/2015   no source of pt chest pain endoscopically evident. small hiatal hernia.  [name] EYE SURGERY    . HARDWARE REMOVAL Right 11/02/2019   Procedure: Second Metatarsal Removal of Deep Implant and Rotational Osteotomy;  Surgeon: Wylene Simmer, MD;  Location: [facility];  Service: Orthopedics;  Laterality: Right;  . MEMBRANE PEEL Left 03/14/2014   Procedure: MEMBRANE PEEL; ENDOLASER;  Surgeon: Hurman Horn, MD;  Location: [facility];  Service: Ophthalmology;  Laterality: Left;  . NM MYOVIEW LTD  2011   Neg Ischemia or infarct.  [name] NM MYOVIEW LTD  10/2015   LOW RISK. Small, fixed basal lateral defect - likely diaphragmatic attenuation. EF 69%  . PARS PLANA VITRECTOMY Left 03/14/2014   Procedure: PARS PLANA VITRECTOMY WITH 25 GAUGE;  Surgeon: Hurman Horn, MD;  Location: [facility];  Service: Ophthalmology;  Laterality: Left;  . TONSILLECTOMY    . TRANSTHORACIC ECHOCARDIOGRAM  2013   Normal EF. No significant Valve Disease  . UPPER GASTROINTESTINAL ENDOSCOPY    . WEIL OSTEOTOMY Right 09/01/2017   Procedure: RIGHT GREAT TOE CHEVRON AND WEIL OSTEOTOMY 2ND METATARSAL;  Surgeon: Newt Minion, MD;  Location: [facility];  Service: Orthopedics;  Laterality: Right;    FAMILY HISTORY Family History  Problem Relation Age of Onset  . Heart disease Mother   . Cancer Paternal Grandfather   . Cancer Sister     SOCIAL HISTORY Social History   Tobacco Use  . Smoking status: Former Smoker    Types: Cigars    Quit date:  07/27/1988    Years since quitting: 32.0  . Smokeless tobacco: Never Used  Vaping Use  . Vaping Use: Never used  Substance Use Topics  . Alcohol use: Yes    Alcohol/week: 3.0 standard drinks    Types: 1 Glasses of wine, 1 Cans of beer, 1 Shots of liquor per week    Comment: occasional  . Drug use: No         OPHTHALMIC EXAM:  Base Eye Exam    Visual Acuity (ETDRS)      Right Left   Dist cc CF  at 5' 20/30 +1   Dist ph cc NI NI   Correction: Glasses       Tonometry (Tonopen, 9:49 AM)      Right Left   Pressure 17 18       Pupils      Dark Light Shape React APD   Right 6 6 Irregular Minimal +1   Left 4 3 Round Slow None       Visual Fields (Counting fingers)      Left Right    Full    Restrictions  Partial outer superior nasal deficiency       Extraocular Movement      Right Left    Full Full       Neuro/Psych    Oriented x3: Yes   Mood/Affect: Normal       Dilation    Both eyes: 1.0% Mydriacyl, 2.5% Phenylephrine @ 9:49 AM        Slit Lamp and Fundus Exam    External Exam      Right Left   External Normal Normal       Slit Lamp Exam      Right Left   Lids/Lashes Normal Normal   Conjunctiva/Sclera White and quiet White and quiet   Cornea Slight thickening, no corneal touch with tube, mostly clear Clear   Anterior Chamber Tube, Tube with  NO corneal touch Deep and quiet   Iris Round and reactive Round and reactive   Lens Anterior chamber intraocular lens Posterior chamber intraocular lens   Anterior Vitreous Normal Normal       Fundus Exam      Right Left   Posterior Vitreous Vitrectomized, clear Vitrectomized   Disc 2+ Pallor temporal 1+ Pallor temporal   C/D Ratio 0.75 0.55   Macula Diffuse atrophy, no macular thickening Microaneurysms, no macular thickening   Vessels Old, RVO old vasculitis now inactive Old macular BRVO, inactive   Periphery Good PRP Good PRP sector          IMAGING AND PROCEDURES  Imaging and Procedures for  08/15/20  OCT, Retina - OU - Both Eyes       Right Eye Quality was good. Scan locations included subfoveal. Central Foveal Thickness: 205. Findings include outer retinal atrophy, central retinal atrophy.   Left Eye Quality was good. Scan locations included subfoveal. Central Foveal Thickness: 381. Progression has been stable.   Notes Diffuse macular atrophy right eye no interval change  Mild diffuse macular thickening left eye with no active CME.  No change in foveal elevation over the last 2 years.  Minor CME superonasal, but this is old and involutional and unchanged for years with no encroachment to center of the fovea       Color Fundus Photography Optos - OU - Both Eyes       Right Eye Progression has been stable.   Notes OD with cloudy media secondary to corneal opacity, striae from corneal edema  Retina attached, optic nerve atrophy good PRP peripheral   OS, mild temporal optic atrophy. 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  Patient reports having visual field performed with [facility].  This could explain how things can rapidly appear in front of his visual space while driving.  Dry eyes, bilateral This condition is most likely responsible for his symptoms of beginning to read without difficulty and then later on developing blurred vision.  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  Patient instructed he may use artificial tears on a as needed basis  while reading for extended period of times so as to prevent drying with the lack of blinking with normal attention given to reading.        ICD-10-CM   1. 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 Secondary corneal edema of right eye  H18.231   3. 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 Dry eyes, bilateral  H04.123     1.  I recommend artificial tears on a as needed basis for the potential dry eye component while reading  2.  No anatomic nor pathologic findings in the macula of either eye to explain his perceptive problems particular while driving and missing signs.  Could be a Media planner.  Most importantly no retinal intervention is required at this time.  As there is no recurrence of CME OS nor active maculopathy  3.  Follow-up with Dr. Carolynn Sayers, new prescription may be appropriate if acuity enhancement and covered with refraction  Ophthalmic Meds Ordered this visit:  No orders of the defined types were placed in this encounter.      Return in about 4 months (around 12/13/2020) for COLOR FP, OCT, DILATE OU.  Patient Instructions  Patient instructed to contact the office promptly for new onset visual acuity declines or distortions    Explained the diagnoses, plan, and follow up with the patient and they expressed understanding.  Patient expressed understanding of the importance of proper follow up care.   Clent Demark Thamara Leger M.D. Diseases & Surgery of the Retina and Vitreous [facility]ina [facility] Di[facility] [place] 08/15/20     Abbreviations: M myopia (nearsighted); A astigmatism; H hyperopia (farsighted); P presbyopia; Mrx spectacle prescription;  CTL contact lenses; OD right eye; OS left eye; OU both eyes  XT exotropia; ET esotropia; PEK punctate epithelial keratitis; PEE punctate epithelial erosions; DES dry eye syndrome; MGD meibomian gland dysfunction; ATs  artificial tears; PFAT's preservative free artificial tears; [place] nuclear sclerotic cataract; PSC posterior subcapsular cataract; ERM epi-retinal membrane; PVD posterior vitreous detachment; RD retinal detachment; DM diabetes mellitus; DR diabetic retinopathy; NPDR non-proliferative diabetic retinopathy; PDR proliferative diabetic retinopathy; CSME clinically significant macular edema; DME diabetic macular edema; dbh dot blot hemorrhages; CWS cotton wool spot; POAG primary open angle glaucoma; C/D cup-to-disc ratio; HVF humphrey visual field; GVF goldmann visual field; OCT optical coherence tomography; IOP intraocular pressure; BRVO Branch retinal vein occlusion; CRVO central retinal vein occlusion; CRAO central retinal artery occlusion; BRAO branch retinal artery occlusion; RT retinal tear; SB scleral buckle; PPV pars plana vitrectomy; VH Vitreous hemorrhage; PRP panretinal laser photocoagulation; IVK intravitreal kenalog; VMT vitreomacular traction; MH Macular hole;  NVD neovascularization of the disc; NVE neovascularization elsewhere; AREDS age related eye disease study; ARMD age related macular degeneration; POAG primary open angle glaucoma; EBMD epithelial/anterior basement membrane dystrophy; ACIOL anterior  chamber intraocular lens; IOL intraocular lens; PCIOL posterior chamber intraocular lens; Phaco/IOL phacoemulsification with intraocular lens placement; [place] photorefractive keratectomy; LASIK laser assisted in situ keratomileusis; HTN hypertension; DM diabetes mellitus; COPD chronic obstructive pulmonary disease

## 2020-08-20 ENCOUNTER — Ambulatory Visit
Admission: RE | Admit: 2020-08-20 | Discharge: 2020-08-20 | Disposition: A | Discharge status: Home / Self Care | Payer: Medicare Other | Source: Ambulatory Visit | Attending: Physician Assistant | Admitting: Physician Assistant

## 2020-08-20 ENCOUNTER — Other Ambulatory Visit: Payer: Self-pay | Admitting: Neurology

## 2020-08-20 ENCOUNTER — Other Ambulatory Visit: Payer: Self-pay

## 2020-08-20 DIAGNOSIS — M48062 Spinal stenosis, lumbar region with neurogenic claudication: Secondary | ICD-10-CM

## 2020-08-20 DIAGNOSIS — M47817 Spondylosis without myelopathy or radiculopathy, lumbosacral region: Secondary | ICD-10-CM | POA: Diagnosis not present

## 2020-08-20 MED ORDER — MEPERIDINE HCL 50 MG/ML IJ SOLN
50.0000 mg | Freq: Once | INTRAMUSCULAR | Status: AC
  Administered 2020-08-20: 50 mg via INTRAMUSCULAR

## 2020-08-20 MED ORDER — METHYLPREDNISOLONE ACETATE 40 MG/ML INJ SUSP (RADIOLOG
120.0000 mg | Freq: Once | INTRAMUSCULAR | Status: AC
  Administered 2020-08-20: 80 mg via EPIDURAL

## 2020-08-20 MED ORDER — ONDANSETRON HCL 4 MG/2ML IJ SOLN
4.0000 mg | Freq: Once | INTRAMUSCULAR | Status: AC
  Administered 2020-08-20: 4 mg via INTRAMUSCULAR

## 2020-08-20 MED ORDER — IOPAMIDOL (ISOVUE-M 200) INJECTION 41%
1.0000 mL | Freq: Once | INTRAMUSCULAR | Status: AC
  Administered 2020-08-20: 1 mL via EPIDURAL

## 2020-08-20 MED FILL — ALFUZOSIN HCL ER 10 MG TAB: 10 | 90 days supply | Qty: 90 | Fill #2

## 2020-08-20 NOTE — Progress Notes (Signed)
After steroid injection pt was brought into nursing recovery area and was complaining about severe pain in lower back from injection. Pt reports pain 9/10 when standing. Ice was applied to the area. Dr. Jeralyn Ruths aware of pts pain and requested pt be observed for a little longer in recovery. After about 10-15 min pts pain is still "severe". Pain medication was given, see MAR. Will continue to monitor the patient and his pain. Pts wife, B, was brought into the building to be with the patient.

## 2020-08-20 NOTE — Discharge Instr - Other Orders (Signed)
1100: Pt reports relief in pain. Pt denies pain sitting down and ranks pain 4/10 when standing. Dr. Jeralyn Ruths will come to d/c the patient

## 2020-08-20 NOTE — Discharge Instructions (Signed)

## 2020-08-21 ENCOUNTER — Other Ambulatory Visit: Payer: Self-pay | Admitting: Neurology

## 2020-08-21 ENCOUNTER — Telehealth: Payer: Self-pay

## 2020-08-21 ENCOUNTER — Ambulatory Visit: Payer: Medicare Other

## 2020-08-21 MED FILL — CARBIDOPA-LEVODOPA 25-100 T: 25-100 | 90 days supply | Qty: 405 | Fill #0

## 2020-08-21 NOTE — Telephone Encounter (Signed)
Pt wife called and advised pt needs samples of livalo. Samples were given and she was advised to check with insurance company to find out what they will approve. [place]

## 2020-08-22 ENCOUNTER — Other Ambulatory Visit: Payer: Self-pay | Admitting: Family Medicine

## 2020-08-22 ENCOUNTER — Telehealth: Payer: Self-pay

## 2020-08-22 MED ORDER — SIMVASTATIN 40 MG PO TABS: 40.0000 mg | ORAL_TABLET | Freq: Every day | ORAL | 3 refills | Status: DC

## 2020-08-22 MED FILL — SIMVASTATIN 40 MG TABLET: 40 | 90 days supply | Qty: 90 | Fill #0

## 2020-08-22 NOTE — Telephone Encounter (Signed)
Pt wife came by and advised that his insurance will cover pravastatin or simvastatin in the place of his livalo which is expensive since the insurance change. Please advise. [place]

## 2020-08-22 NOTE — Telephone Encounter (Signed)
Pt was advised [name] 

## 2020-08-22 NOTE — Telephone Encounter (Signed)
Let him know that I will switch to simvastatin but he needs to let me know if he has any muscle aches or pains with it.

## 2020-09-03 ENCOUNTER — Other Ambulatory Visit: Payer: Self-pay

## 2020-09-03 ENCOUNTER — Ambulatory Visit: Payer: Medicare Other | Attending: Neurosurgery

## 2020-09-03 DIAGNOSIS — R293 Abnormal posture: Secondary | ICD-10-CM | POA: Diagnosis not present

## 2020-09-03 DIAGNOSIS — M6281 Muscle weakness (generalized): Secondary | ICD-10-CM | POA: Insufficient documentation

## 2020-09-03 DIAGNOSIS — M545 Low back pain, unspecified: Secondary | ICD-10-CM | POA: Insufficient documentation

## 2020-09-03 DIAGNOSIS — G8929 Other chronic pain: Secondary | ICD-10-CM | POA: Diagnosis not present

## 2020-09-03 NOTE — Therapy (Signed)
[place], [place], [address] Phone: [phone]   Fax:  [phone]  Physical Therapy Treatment  Patient Details  Name: [name] MRN: [id number] Date of Birth: April 14, 1951 Referring Provider (PT): Kathyrn Sheriff, MD   Encounter Date: 09/03/2020   PT End of Session - 09/03/20 1451    Visit Number 6    Number of Visits 10    Date for PT Re-Evaluation 09/14/20    Authorization Type UHC MCR    Progress Note Due on Visit 15    PT Start Time 6294    PT Stop Time 1524    PT Time Calculation (min) 39 min    Activity Tolerance Patient tolerated treatment well    Behavior During Therapy [facility] for tasks assessed/performed           Past Medical History:  Diagnosis Date  . Allergy   . Arthritis   . BPH (benign prostatic hyperplasia)   . Complication of anesthesia    Pt. stated he had a reaction that ended in him requiring urinary cath placement  . Diverticulosis   . GERD (gastroesophageal reflux disease)    esophageal spasms  . Glaucoma   . Gout   . Head injury, closed, with concussion   . Hepatitis C    chronic - Has been treated with Harvoni  . HLD (hyperlipidemia)    statin intolerant (Crestor & Simvastatin) - Taking Livalo 17m / week  . Hypertension   . Parkinson's disease ([place]   . Plantar fasciitis    right  . PVD (peripheral vascular disease) ([place]    With no claudication; only mild abdominal aortic atherosclerosis noted on ultrasound.  . Thoracic ascending aortic aneurysm (HCC)    4.2 cm ascending TAA 09/2016 CT, 1 yr f/u rec  . Ulcer     Past Surgical History:  Procedure Laterality Date  . BUNIONECTOMY WITH WEIL OSTEOTOMY Right 11/02/2019   Procedure: Right Foot Lapidus, Modified McBride Bunionectomy,  Hallux Akin Osteotomy;  Surgeon: HWylene Simmer MD;  [place]  Service: Orthopedics;  Laterality: Right;  . CARDIAC CATHETERIZATION  2005   30% Cx. Dr. GMelvern Banker .  cataract surgery Left 11/05/2014  . COLONOSCOPY    . ESOPHAGOGASTRODUODENOSCOPY N/A 05/02/2015   Procedure: ESOPHAGOGASTRODUODENOSCOPY (EGD);  Surgeon: RRonald Lobo MD;  [facility];  Service: Endoscopy;  Laterality: N/A;  . ESOPHAGOGASTRODUODENOSCOPY  05/02/2015   no source of pt chest pain endoscopically evident. small hiatal hernia.  .Marland KitchenEYE SURGERY    . HARDWARE REMOVAL Right 11/02/2019   Procedure: Second Metatarsal Removal of Deep Implant and Rotational Osteotomy;  Surgeon: HWylene Simmer MD;  [place]  Service: Orthopedics;  Laterality: Right;  . MEMBRANE PEEL Left 03/14/2014   Procedure: MEMBRANE PEEL; ENDOLASER;  Surgeon: GHurman Horn MD;  [place]  Service: Ophthalmology;  Laterality: Left;  . NM MYOVIEW LTD  2011   Neg Ischemia or infarct.  .Marland KitchenNM MYOVIEW LTD  10/2015   LOW RISK. Small, fixed basal lateral defect - likely diaphragmatic attenuation. EF 69%  . PARS PLANA VITRECTOMY Left 03/14/2014   Procedure: PARS PLANA VITRECTOMY WITH 25 GAUGE;  Surgeon: GHurman Horn MD;  [place]  Service: Ophthalmology;  Laterality: Left;  . TONSILLECTOMY    . TRANSTHORACIC ECHOCARDIOGRAM  2013   Normal EF. No significant Valve Disease  . UPPER GASTROINTESTINAL ENDOSCOPY    . WEIL OSTEOTOMY Right 09/01/2017  Procedure: RIGHT GREAT TOE CHEVRON AND WEIL OSTEOTOMY 2ND METATARSAL;  Surgeon: Newt Minion, MD;  Location: [place];  Service: Orthopedics;  Laterality: Right;    There were no vitals filed for this visit.   Subjective Assessment - 09/03/20 1448    Subjective "It's calmed down some since the injection. It hasn't gotten rid of it, but I've seen a significant difference. Still having some pain but not as bad and not as often."    Pertinent History Early onset Parkinsons. Spinal stenosis. cortisone injection in spine 3 weeks ago.    Limitations Sitting;House hold activities;Lifting;Standing;Walking    How long can you sit comfortably?  comfortably about an hour    How long can you stand comfortably? "I'm not doing a lot of standing"    How long can you walk comfortably? "I'm not doing much walking outside of the house"    Diagnostic tests see chart    Patient Stated Goals to be able to move with less pain    Currently in Pain? Yes    Pain Score 5     Pain Location Back    Pain Orientation Posterior;Left;Right   lower on R side and a little higher on L side of low back   Pain Descriptors / Indicators --   grabbing/pulling   Pain Type Chronic pain    Pain Onset More than a month ago              [facility] PT Assessment - 09/03/20 0001      Assessment   Medical Diagnosis spinal stenosis with neurogenic claudication    Referring Provider (PT) Kathyrn Sheriff, MD    Onset Date/Surgical Date 06/13/20      Observation/Other Assessments   Focus on Therapeutic Outcomes (FOTO)  52% function; predicted 59% function                         [facility] Adult PT Treatment/Exercise - 09/03/20 0001      Self-Care   Other Self-Care Comments  See pt education      Lumbar Exercises: Stretches   Double Knee to Chest Stretch Limitations DKTC x 20 with green swiss ball      Lumbar Exercises: Aerobic   Nustep L6 x 5 min UE and LE      Lumbar Exercises: Standing   Other Standing Lumbar Exercises "big" step forward with opposite UE flexion - cues for exaggerated movements      Lumbar Exercises: Seated   Other Seated Lumbar Exercises Sitting with wide stance at EOM and reaching across body x 20 each UE    Other Seated Lumbar Exercises Seated with wide LE stance - large circles and reaching with BUE (out to side, above head, forward with forward reach, then out to side again - then switched directions) x 20 each direction; cues for "big" movements and to maintain upright posture with core activation      Lumbar Exercises: Supine   Bent Knee Raise 15 reps   15x each LE while keeping low back against mat and core activated    Bridge with [employer] 15 reps   2 x 15   Bridge with [employer] Limitations Cues for glute and hip ADD activation    Other Supine Lumbar Exercises LTR with green swiss ball x 15 each direction      Manual Therapy   Manual Therapy Soft tissue mobilization;Joint mobilization    Joint Mobilization Lumbar PAs along L4-L5  and L5-S1 grades I-IV    Soft tissue mobilization STM and myofascial release along lumbar paraspinals and bilateral QL                  PT Education - 09/03/20 2009    Education Details Education provided to emphasize movements and focus on maintaining upright posture with core activation    Person(s) Educated Patient    Methods Explanation;Demonstration    Comprehension Verbalized understanding;Returned demonstration            PT Short Term Goals - 08/14/20 1514      PT SHORT TERM GOAL #1   Title Pt to be I with initial HEP to assist with pain management    Baseline one exercise issued at eval    Time 3    Period Weeks    Status Achieved    Target Date 07/31/20      PT SHORT TERM GOAL #2   Title pt to verbalize and demo proper posture and lifting mechanics to prevent and reduce low back pain     Baseline unmet    Time 3    Period Weeks    Status On-going    Target Date 07/31/20             PT Long Term Goals - 09/03/20 2015      PT LONG TERM GOAL #1   Title Pt will be able to maintain neutral spine posture x 10 min to perform functional activities.    Baseline unable    Time 5    Period Weeks    Status On-going      PT LONG TERM GOAL #2   Title pt to be able to sit, stand and walk for >/= 60 min reporting </= 3/10 low back pain for functional mobility/ endurance required for home/recreational tasks    Baseline unable    Time 5    Period Weeks    Status Not Met      PT LONG TERM GOAL #3   Title pt to increase FOTO score to </= 41% limited to demo improvement in function     Baseline 09/03/2020: 48% limited (52% function)    Time  5    Period Weeks    Status Partially Met      PT LONG TERM GOAL #4   Title pt to be I with all HEP given as of last visit to maintain and progress current level of function    Baseline unmet    Time 5    Period Weeks    Status On-going                 Plan - 09/03/20 1457    Clinical Impression Statement Patient had improved tolerance with session due to some back pain relief from recent injection 08/20/2020. He still does not appear to have a clear directional preference but cues are provided throughout session for patient to sit/stand upright. He should be able to progress with skilled PT since having relief from injection to more efficiently address postural and strength deficits. Incorporated interventions that emphasize "big movements" to address patient's functional mobility limitations and to aide pt as his Parkinson's progresses.    Personal Factors and Comorbidities Fitness   pt enjoys exercise   Examination-Activity Limitations Lift;Bend;Locomotion Level;Stand;Sit    Examination-Participation Restrictions Cleaning;Community Activity;Yard Work    Stability/Clinical Decision Making Evolving/Moderate complexity    Rehab Potential Good    PT Frequency 1x / week  PT Duration 4 weeks   5 weeks   PT Treatment/Interventions ADLs/Self Care Home Management;Ultrasound;Moist Heat;Electrical Stimulation;Gait training;Stair training;Functional mobility training;Neuromuscular re-education;Balance training;Therapeutic exercise;Therapeutic activities;Patient/family education;Manual techniques;Dry needling;Passive range of motion;Taping    PT Next Visit Plan Re-evaluation. Progress core and hip strengthening as tolerated. Hip flexibility. "big" exercise handout for patient    PT Home Exercise Plan [email or web address]    Consulted and Agree with Plan of Care Patient           Patient will benefit from skilled therapeutic intervention in order to improve the following deficits and impairments:   Abnormal gait,Improper body mechanics,Pain,Postural dysfunction,Decreased mobility,Decreased activity tolerance,Decreased endurance,Decreased range of motion,Decreased strength,Hypomobility,Impaired flexibility,Difficulty walking,Decreased balance  Visit Diagnosis: Chronic midline low back pain without sciatica  Abnormal posture  Muscle weakness (generalized)     Problem List Patient Active Problem List   Diagnosis Date Noted  . Dry eyes, bilateral 08/15/2020  . Retinal vasculitis, bilateral 03/04/2020  . Retinal vein occlusion 01/17/2020  . Presbycusis of right ear 01/17/2020  . 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 Secondary corneal edema of right eye 12/12/2019  . Ptosis of right eyelid 12/12/2019  . OAB (overactive bladder) 11/29/2019  . Bunion of great toe of right foot 07/14/2017  . Claw toe, acquired, right 07/14/2017  . 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  . Medication management 10/09/2016  . Plantar fasciitis of right foot 02/19/2015  . Depression 08/14/2014  . Family history of heart disease in male family member before age 28 04/26/2014  . Mild cognitive impairment 03/13/2014  . 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 Hyperlipidemia with target LDL less than 100   . Abdominal aortic atherosclerosis ([place])   . Moderate essential hypertension 02/25/2011  . Arthropathy 02/25/2011  . HAV (hallux abducto valgus) 02/25/2011      Haydee Monica, PT, DPT 09/03/20 8:19 PM  [facility] [address] [place], [place], [address] Phone: [phone]   Fax:  [phone]  Name: [name] MRN: [id number] Date of Birth: [birth year]

## 2020-09-09 ENCOUNTER — Ambulatory Visit: Payer: Medicare Other

## 2020-09-09 ENCOUNTER — Other Ambulatory Visit: Payer: Self-pay

## 2020-09-09 DIAGNOSIS — M6281 Muscle weakness (generalized): Secondary | ICD-10-CM | POA: Diagnosis not present

## 2020-09-09 DIAGNOSIS — R293 Abnormal posture: Secondary | ICD-10-CM | POA: Diagnosis not present

## 2020-09-09 DIAGNOSIS — G8929 Other chronic pain: Secondary | ICD-10-CM | POA: Diagnosis not present

## 2020-09-09 DIAGNOSIS — M545 Low back pain, unspecified: Secondary | ICD-10-CM | POA: Diagnosis not present

## 2020-09-09 MED FILL — IRBESARTAN 300 MG TABS: 300 | 30 days supply | Qty: 30 | Fill #6

## 2020-09-09 NOTE — Therapy (Signed)
[place], [place], [address] Phone: [phone]   Fax:  [phone]  Physical Therapy Treatment  Patient Details  Name: [name] MRN: [id number] Date of Birth: 1950/09/26 Referring Provider (PT): Kathyrn Sheriff, MD   Encounter Date: 09/09/2020   PT End of Session - 09/09/20 1424    Visit Number 7    Number of Visits 10    Date for PT Re-Evaluation 09/14/20    Authorization Type UHC MCR    Progress Note Due on Visit 15    PT Start Time 5170    PT Stop Time 1457    PT Time Calculation (min) 40 min    Activity Tolerance Patient tolerated treatment well    Behavior During Therapy [employer] for tasks assessed/performed           Past Medical History:  Diagnosis Date  . Allergy   . Arthritis   . BPH (benign prostatic hyperplasia)   . Complication of anesthesia    Pt. stated he had a reaction that ended in him requiring urinary cath placement  . Diverticulosis   . GERD (gastroesophageal reflux disease)    esophageal spasms  . Glaucoma   . Gout   . Head injury, closed, with concussion   . Hepatitis C    chronic - Has been treated with Harvoni  . HLD (hyperlipidemia)    statin intolerant (Crestor & Simvastatin) - Taking Livalo 33m / week  . Hypertension   . Parkinson's disease ([place]   . Plantar fasciitis    right  . PVD (peripheral vascular disease) ([place]    With no claudication; only mild abdominal aortic atherosclerosis noted on ultrasound.  . Thoracic ascending aortic aneurysm (HCC)    4.2 cm ascending TAA 09/2016 CT, 1 yr f/u rec  . Ulcer     Past Surgical History:  Procedure Laterality Date  . BUNIONECTOMY WITH WEIL OSTEOTOMY Right 11/02/2019   Procedure: Right Foot Lapidus, Modified McBride Bunionectomy,  Hallux Akin Osteotomy;  Surgeon: HWylene Simmer MD;  [place]  Service: Orthopedics;  Laterality: Right;  . CARDIAC CATHETERIZATION  2005   30% Cx. Dr. GMelvern Banker  . cataract surgery Left 11/05/2014  . COLONOSCOPY    . ESOPHAGOGASTRODUODENOSCOPY N/A 05/02/2015   Procedure: ESOPHAGOGASTRODUODENOSCOPY (EGD);  Surgeon: RRonald Lobo MD;  [facility];  Service: Endoscopy;  Laterality: N/A;  . ESOPHAGOGASTRODUODENOSCOPY  05/02/2015   no source of pt chest pain endoscopically evident. small hiatal hernia.  .Marland KitchenEYE SURGERY    . HARDWARE REMOVAL Right 11/02/2019   Procedure: Second Metatarsal Removal of Deep Implant and Rotational Osteotomy;  Surgeon: HWylene Simmer MD;  [place]  Service: Orthopedics;  Laterality: Right;  . MEMBRANE PEEL Left 03/14/2014   Procedure: MEMBRANE PEEL; ENDOLASER;  Surgeon: GHurman Horn MD;  [place]  Service: Ophthalmology;  Laterality: Left;  . NM MYOVIEW LTD  2011   Neg Ischemia or infarct.  .Marland KitchenNM MYOVIEW LTD  10/2015   LOW RISK. Small, fixed basal lateral defect - likely diaphragmatic attenuation. EF 69%  . PARS PLANA VITRECTOMY Left 03/14/2014   Procedure: PARS PLANA VITRECTOMY WITH 25 GAUGE;  Surgeon: GHurman Horn MD;  [place]  Service: Ophthalmology;  Laterality: Left;  . TONSILLECTOMY    . TRANSTHORACIC ECHOCARDIOGRAM  2013   Normal EF. No significant Valve Disease  . UPPER GASTROINTESTINAL ENDOSCOPY    . WEIL OSTEOTOMY Right 09/01/2017  Procedure: RIGHT GREAT TOE CHEVRON AND WEIL OSTEOTOMY 2ND METATARSAL;  Surgeon: Newt Minion, MD;  Location: [place];  Service: Orthopedics;  Laterality: Right;    There were no vitals filed for this visit.   Subjective Assessment - 09/09/20 1419    Subjective Patient reports the injection helped a little bit with his back pain, but has not given him much relief. Patient reports a slight decrease in pain following last session. He reports compliance with HEP.    Pertinent History Early onset Parkinsons. Spinal stenosis. cortisone injection in spine 3 weeks ago.    Limitations Sitting;House hold activities;Lifting;Standing;Walking     How long can you sit comfortably? comfortably about an hour    How long can you stand comfortably? "I'm not doing a lot of standing"    How long can you walk comfortably? "I'm not doing much walking outside of the house"    Diagnostic tests see chart    Patient Stated Goals to be able to move with less pain    Currently in Pain? Yes    Pain Score 3     Pain Location Back    Pain Orientation Posterior;Left;Right    Pain Descriptors / Indicators Aching    Pain Type Chronic pain    Pain Onset More than a month ago              [facility] PT Assessment - 09/09/20 0001      Special Tests   Other special tests LLD 99.5 cm L ASIS to medial malleoli 102 cm Rt ASIS to medial malleoli; symmetrical ASIS, iliac crest, and greater trochanter in standing                         [facility] Adult PT Treatment/Exercise - 09/09/20 0001      Self-Care   Other Self-Care Comments  see patient education      Lumbar Exercises: Stretches   Other Lumbar Stretch Exercise LTR 1 min      Lumbar Exercises: Standing   Other Standing Lumbar Exercises march 3 x 10    Other Standing Lumbar Exercises "big" step forward with opposite UE flexion x 5      Lumbar Exercises: Seated   Other Seated Lumbar Exercises seated march with opposite reach 2 x 10    Other Seated Lumbar Exercises Seated with wide LE stance -  reaching with BUE (out to side, above head, forward with forward reach, then out to side again - then switched directions) x 5 cues for "big" movements and to maintain upright posture with core activation      Lumbar Exercises: Supine   Bent Knee Raise Limitations 2 x 10                  PT Education - 09/09/20 1428    Education Details HEP updated to include LSVT big exercises. Heel lift issued with patient instructed on gradual introduction to wear.    Person(s) Educated Patient    Methods Explanation;Demonstration;Handout    Comprehension Verbalized understanding;Returned  demonstration            PT Short Term Goals - 08/14/20 1514      PT SHORT TERM GOAL #1   Title Pt to be I with initial HEP to assist with pain management    Baseline one exercise issued at eval    Time 3    Period Weeks    Status Achieved  Target Date 07/31/20      PT SHORT TERM GOAL #2   Title pt to verbalize and demo proper posture and lifting mechanics to prevent and reduce low back pain     Baseline unmet    Time 3    Period Weeks    Status On-going    Target Date 07/31/20             PT Long Term Goals - 09/03/20 2015      PT LONG TERM GOAL #1   Title Pt will be able to maintain neutral spine posture x 10 min to perform functional activities.    Baseline unable    Time 5    Period Weeks    Status On-going      PT LONG TERM GOAL #2   Title pt to be able to sit, stand and walk for >/= 60 min reporting </= 3/10 low back pain for functional mobility/ endurance required for home/recreational tasks    Baseline unable    Time 5    Period Weeks    Status Not Met      PT LONG TERM GOAL #3   Title pt to increase FOTO score to </= 41% limited to demo improvement in function     Baseline 09/03/2020: 48% limited (52% function)    Time 5    Period Weeks    Status Partially Met      PT LONG TERM GOAL #4   Title pt to be I with all HEP given as of last visit to maintain and progress current level of function    Baseline unmet    Time 5    Period Weeks    Status On-going                 Plan - 09/09/20 1424    Clinical Impression Statement Patient noted to have true leg length discrepancy with Lt leg shorter when assessed in supine. Heel lift was issued with patient reporting less low back when completing standing marches with heel lift inserted compared to without. Overall good tolerance to hip/core strengthening and trunk mobility with patient reporting minor low back discomfort during supine exercises, though reported it had more so to do with his beltline  position on his back as opposed to pain with the exercises.    Personal Factors and Comorbidities Fitness   pt enjoys exercise   Examination-Activity Limitations Lift;Bend;Locomotion Level;Stand;Sit    Examination-Participation Restrictions Cleaning;Community Activity;Yard Work    [employer] Evolving/Moderate complexity    Rehab Potential Good    PT Frequency 1x / week    PT Duration 4 weeks   5 weeks   PT Treatment/Interventions ADLs/Self Care Home Management;Ultrasound;Moist Heat;Electrical Stimulation;Gait training;Stair training;Functional mobility training;Neuromuscular re-education;Balance training;Therapeutic exercise;Therapeutic activities;Patient/family education;Manual techniques;Dry needling;Passive range of motion;Taping    PT Next Visit Plan assess response to heel lift. Re-evaluation. Progress core and hip strengthening as tolerated. Hip flexibility.    PT Home Exercise Plan [email or web address]    Consulted and Agree with Plan of Care Patient           Patient will benefit from skilled therapeutic intervention in order to improve the following deficits and impairments:  Abnormal gait,Improper body mechanics,Pain,Postural dysfunction,Decreased mobility,Decreased activity tolerance,Decreased endurance,Decreased range of motion,Decreased strength,Hypomobility,Impaired flexibility,Difficulty walking,Decreased balance  Visit Diagnosis: Chronic midline low back pain without sciatica  Abnormal posture  Muscle weakness (generalized)     Problem List Patient Active Problem List   Diagnosis Date Noted  .  Dry eyes, bilateral 08/15/2020  . Retinal vasculitis, bilateral 03/04/2020  . Retinal vein occlusion 01/17/2020  . Presbycusis of right ear 01/17/2020  . 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 Secondary corneal edema of right eye 12/12/2019  . Ptosis of right eyelid 12/12/2019  . OAB (overactive bladder) 11/29/2019  .  Bunion of great toe of right foot 07/14/2017  . Claw toe, acquired, right 07/14/2017  . 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  . Medication management 10/09/2016  . Plantar fasciitis of right foot 02/19/2015  . Depression 08/14/2014  . Family history of heart disease in male family member before age 20 04/26/2014  . Mild cognitive impairment 03/13/2014  . 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 Hyperlipidemia with target LDL less than 100   . Abdominal aortic atherosclerosis ([place])   . Moderate essential hypertension 02/25/2011  . Arthropathy 02/25/2011  . HAV (hallux abducto valgus) 02/25/2011   [name] Grant, PT, DPT, ATC 09/09/20 3:19 PM  [facility] [address] [place], [place], [address] Phone: [phone]   Fax:  [phone]  Name: [name] MRN: [id number] Date of Birth: 1951-05-10

## 2020-09-10 ENCOUNTER — Encounter: Payer: Self-pay | Admitting: Family Medicine

## 2020-09-10 ENCOUNTER — Ambulatory Visit (INDEPENDENT_AMBULATORY_CARE_PROVIDER_SITE_OTHER): Payer: Medicare Other | Admitting: Family Medicine

## 2020-09-10 ENCOUNTER — Other Ambulatory Visit: Payer: Self-pay | Admitting: Family Medicine

## 2020-09-10 VITALS — BP 146/98 | HR 68 | Temp 96.6°F | Ht 69.5 in | Wt 158.4 lb

## 2020-09-10 DIAGNOSIS — Z8249 Family history of ischemic heart disease and other diseases of the circulatory system: Secondary | ICD-10-CM

## 2020-09-10 DIAGNOSIS — G20C Parkinsonism, unspecified: Secondary | ICD-10-CM

## 2020-09-10 DIAGNOSIS — Z79899 Other long term (current) drug therapy: Secondary | ICD-10-CM

## 2020-09-10 DIAGNOSIS — I10 Essential (primary) hypertension: Secondary | ICD-10-CM

## 2020-09-10 DIAGNOSIS — E785 Hyperlipidemia, unspecified: Secondary | ICD-10-CM | POA: Diagnosis not present

## 2020-09-10 DIAGNOSIS — H9111 Presbycusis, right ear: Secondary | ICD-10-CM | POA: Diagnosis not present

## 2020-09-10 DIAGNOSIS — N3281 Overactive bladder: Secondary | ICD-10-CM

## 2020-09-10 DIAGNOSIS — K219 Gastro-esophageal reflux disease without esophagitis: Secondary | ICD-10-CM

## 2020-09-10 DIAGNOSIS — F32A Depression, unspecified: Secondary | ICD-10-CM

## 2020-09-10 DIAGNOSIS — Z0000 Encounter for general adult medical examination without abnormal findings: Secondary | ICD-10-CM | POA: Diagnosis not present

## 2020-09-10 DIAGNOSIS — G20 Parkinson's disease: Secondary | ICD-10-CM | POA: Diagnosis not present

## 2020-09-10 DIAGNOSIS — I700 Atherosclerosis of aorta: Secondary | ICD-10-CM | POA: Diagnosis not present

## 2020-09-10 DIAGNOSIS — M48062 Spinal stenosis, lumbar region with neurogenic claudication: Secondary | ICD-10-CM | POA: Diagnosis not present

## 2020-09-10 DIAGNOSIS — H348192 Central retinal vein occlusion, unspecified eye, stable: Secondary | ICD-10-CM

## 2020-09-10 DIAGNOSIS — G3184 Mild cognitive impairment, so stated: Secondary | ICD-10-CM

## 2020-09-10 LAB — CBC WITH DIFFERENTIAL/PLATELET
Basophils Absolute: 0.1 10*3/uL (ref 0.0–0.2)
Basos: 1 %
EOS (ABSOLUTE): 0.1 10*3/uL (ref 0.0–0.4)
Eos: 1 %
Hematocrit: 41.2 % (ref 37.5–51.0)
Hemoglobin: 14.1 g/dL (ref 13.0–17.7)
Immature Grans (Abs): 0 10*3/uL (ref 0.0–0.1)
Immature Granulocytes: 1 %
Lymphocytes Absolute: 1.4 10*3/uL (ref 0.7–3.1)
Lymphs: 23 %
MCH: 31.3 pg (ref 26.6–33.0)
MCHC: 34.2 g/dL (ref 31.5–35.7)
MCV: 92 fL (ref 79–97)
Monocytes Absolute: 0.8 10*3/uL (ref 0.1–0.9)
Monocytes: 13 %
Neutrophils Absolute: 3.8 10*3/uL (ref 1.4–7.0)
Neutrophils: 61 %
Platelets: 243 10*3/uL (ref 150–450)
RBC: 4.5 x10E6/uL (ref 4.14–5.80)
RDW: 11.9 % (ref 11.6–15.4)
WBC: 6.1 10*3/uL (ref 3.4–10.8)

## 2020-09-10 LAB — COMPREHENSIVE METABOLIC PANEL
ALT: 10 IU/L (ref 0–44)
AST: 16 IU/L (ref 0–40)
Albumin/Globulin Ratio: 1.8 (ref 1.2–2.2)
Albumin: 4.8 g/dL (ref 3.8–4.8)
Alkaline Phosphatase: 74 IU/L (ref 44–121)
BUN/Creatinine Ratio: 18 (ref 10–24)
BUN: 22 mg/dL (ref 8–27)
Bilirubin Total: 0.5 mg/dL (ref 0.0–1.2)
CO2: 22 mmol/L (ref 20–29)
Calcium: 9.4 mg/dL (ref 8.6–10.2)
Chloride: 101 mmol/L (ref 96–106)
Creatinine, Ser: 1.24 mg/dL (ref 0.76–1.27)
GFR calc Af Amer: 68 mL/min/{1.73_m2} (ref >59)
GFR calc non Af Amer: 59 mL/min/{1.73_m2} — ABNORMAL LOW (ref >59)
Globulin, Total: 2.6 g/dL (ref 1.5–4.5)
Glucose: 96 mg/dL (ref 65–99)
Potassium: 4.3 mmol/L (ref 3.5–5.2)
Sodium: 137 mmol/L (ref 134–144)
Total Protein: 7.4 g/dL (ref 6.0–8.5)

## 2020-09-10 LAB — LIPID PANEL
Chol/HDL Ratio: 2.8 ratio (ref 0.0–5.0)
Cholesterol, Total: 175 mg/dL (ref 100–199)
HDL: 63 mg/dL (ref >39)
LDL Chol Calc (NIH): 104 mg/dL — ABNORMAL HIGH (ref 0–99)
Triglycerides: 38 mg/dL (ref 0–149)
VLDL Cholesterol Cal: 8 mg/dL (ref 5–40)

## 2020-09-10 MED ORDER — CITALOPRAM HYDROBROMIDE 40 MG PO TABS: 40.0000 mg | ORAL_TABLET | Freq: Every day | ORAL | 3 refills | Status: DC

## 2020-09-10 MED FILL — CITALOPRAM HBR 40 MG TABLET: 40 | 30 days supply | Qty: 30 | Fill #0

## 2020-09-10 NOTE — Progress Notes (Signed)
[name] is a 70 y.o. male who presents for annual wellness visit and follow-up on chronic medical conditions.  He has retinal vein occlusion and is regularly followed by ophthalmology for that. He is also followed by neurology for mild cognitive impairment as well as Parkinson's disease. He seems to be handling himself fairly well with that. Psychologically he thinks the Celexa is helping but not as much as he would like. His reflux seems to be under good control with as needed pantoprazole. He does have difficulty with OAB symptoms and presently is taking alfuzosin but is unsure whether this is really working. Continues have difficulty with low back pain and has had several epidural injections. Some of them have helped but others have not been useful at all. He continues on simvastatin and having no difficulty with that. He is also taking Avapro for his blood pressure. Review of his record also indicates aortic atherosclerosis. He does have a family history of heart disease presently he is taking Livalo but will be switching to Zocor. Does have presbycusis and does use a hearing aid.   Immunizations and Health Maintenance Immunization History  Administered Date(s) Administered  . DTaP 09/10/1997, 08/11/2007  . Fluad Quad(high Dose 65+) 05/17/2019  . Influenza Split 05/09/2012, 06/09/2015, 07/11/2015  . Influenza Whole 07/10/2004, 05/25/2006  . Influenza, High Dose Seasonal PF 05/05/2017, 04/15/2018  . Influenza,inj,Quad PF,6+ Mos 04/28/2013, 04/26/2014, 05/31/2016  . Influenza-Unspecified 05/25/2016  . Moderna Sars-Covid-2 Vaccination 08/20/2019, 09/17/2019, 05/22/2020  . Pneumococcal Conjugate-13 09/07/2010  . Pneumococcal Polysaccharide-23 07/16/2017  . Tdap 09/05/2011  . Zoster Recombinat (Shingrix) 01/17/2020, 03/20/2020   Health Maintenance Due  Topic Date Due  . INFLUENZA VACCINE  02/25/2020    Last colonoscopy: 09/15/13 Last PSA: unknown Dentist: Q six months  Ophtho: Q  year Exercise: stretching 7 days a week  Other doctors caring for patient include: DR. San Jetty,  [facility]  Advanced Directives: Does Patient Have a Medical Advance Directive?: Yes Does patient want to make changes to medical advance directive?: Yes (Inpatient - patient defers changing a medical advance directive at this time - Information given)  Depression screen:  See questionnaire below.     Depression screen [facility] 2/9 09/10/2020 03/20/2020 01/17/2020 12/06/2018 01/28/2017  Decreased Interest 0 0 0 1 0  Down, Depressed, Hopeless 0 0 1 1 0  PHQ - 2 Score 0 0 1 2 0  Altered sleeping - - - 1 -  Tired, decreased energy - - - 1 -  Change in appetite - - - 0 -  Feeling bad or failure about yourself  - - - 1 -  Trouble concentrating - - - 1 -  Moving slowly or fidgety/restless - - - 1 -  Suicidal thoughts - - - 0 -  PHQ-9 Score - - - 7 -  Difficult doing work/chores - - - Somewhat difficult -  Some recent data might be hidden    Fall Screen: See Questionaire below.   Fall Risk  09/10/2020 01/17/2020 08/24/2018 02/16/2018 08/17/2017  Falls in the past year? 0 0 0 No No  Number falls in past yr: 0 - - - -  Injury with Fall? 0 - - No -  Comment - - - - -  Risk for fall due to : No Fall Risks - - - -  Follow up Falls evaluation completed - - - -    ADL screen:  See questionnaire below.  Functional Status Survey: Is the patient deaf or have  difficulty hearing?: Yes Does the patient have difficulty seeing, even when wearing glasses/contacts?: Yes Does the patient have difficulty concentrating, remembering, or making decisions?: No Does the patient have difficulty walking or climbing stairs?: No Does the patient have difficulty dressing or bathing?: No Does the patient have difficulty doing errands alone such as visiting a doctor's office or shopping?: No   Review of Systems  Constitutional: -, -unexpected weight change, -anorexia, -fatigue Allergy: -sneezing, -itching,  -congestion Dermatology: denies changing moles, rash, lumps ENT: -runny nose, -ear pain, -sore throat,  Cardiology:  -chest pain, -palpitations, -orthopnea, Respiratory: -cough, -shortness of breath, -dyspnea on exertion, -wheezing,  Gastroenterology: -abdominal pain, -nausea, -vomiting, -diarrhea, -constipation, -dysphagia Hematology: -bleeding or bruising problems Musculoskeletal: -arthralgias, -myalgias, -joint swelling, -back pain, - Ophthalmology: -vision changes,  Urology: -dysuria, -difficulty urinating,  -urinary frequency, -urgency, incontinence Neurology: -, -numbness, , -memory loss, -falls, -dizziness    PHYSICAL EXAM:  BP (!) 146/98   Pulse 68   Temp (!) 96.6 F (35.9 C)   Ht 5' 9.5" (1.765 m)   Wt 158 lb 6.4 oz (71.8 kg)   SpO2 98%   BMI 23.06 kg/m   General Appearance: Alert, cooperative, no distress, appears stated age Head: Normocephalic, without obvious abnormality, atraumatic Eyes: PERRL, conjunctiva/corneas clear, EOM's intact, Ears: Normal TM's and external ear canals Nose: Nares normal, mucosa normal, no drainage or sinus   tenderness Throat: Lips, mucosa, and tongue normal; teeth and gums normal Neck: Supple, no lymphadenopathy, thyroid:no enlargement/tenderness/nodules; no carotid bruit or JVD Lungs: Clear to auscultation bilaterally without wheezes, rales or ronchi; respirations unlabored Heart: Regular rate and rhythm, S1 and S2 normal, no murmur, rub or gallop Abdomen: Soft, non-tender, nondistended, normoactive bowel sounds, no masses, no hepatosplenomegaly Extremities: No clubbing, cyanosis or edema. Fine tremor noted on the left. Pulses: 2+ and symmetric all extremities Skin: Skin color, texture, turgor normal, no rashes or lesions Lymph nodes: Cervical, supraclavicular, and axillary nodes normal Neurologic: CNII-XII intact, normal strength, sensation and gait; reflexes 2+ and symmetric throughout   Psych: Normal mood, affect, hygiene and  grooming  ASSESSMENT/PLAN: Moderate essential hypertension - Plan: CBC with Differential/Platelet, Comprehensive metabolic panel  Hyperlipidemia with target LDL less than 100 - Plan: Lipid panel  Mild cognitive impairment  Parkinsonian tremor (HCC)  Family history of heart disease in male family member before age 79  Presbycusis of right ear, unspecified hearing status on contralateral side  Retinal vein occlusion, unspecified laterality, unspecified retinal vein  OAB (overactive bladder)  Medication management  Depression, unspecified depression type - Plan: citalopram (CELEXA) 40 MG tablet  Spinal stenosis of lumbar region with neurogenic claudication  Abdominal aortic atherosclerosis (HCC)  Gastroesophageal reflux disease, unspecified whether esophagitis present He will continue on his present medication regimen but I will increase his Celexa. Continue on pantoprazole for his reflux. Continue to follow-up with neurology and ophthalmology. If he continues have difficulty with OAB, he will see urology. Continue to wear his hearing aid.   Immunization recommendations discussed.  Colonoscopy recommendations reviewed.   Medicare Attestation I have personally reviewed: The patient's medical and social history Their use of alcohol, tobacco or illicit drugs Their current medications and supplements The patient's functional ability including ADLs,fall risks, home safety risks, cognitive, and hearing and visual impairment Diet and physical activities Evidence for depression or mood disorders  The patient's weight, height, and BMI have been recorded in the chart.  I have made referrals, counseling, and provided education to the patient based on review of the above  and I have provided the patient with a written personalized care plan for preventive services.     Jill Alexanders, MD   09/10/2020

## 2020-09-10 NOTE — Patient Instructions (Signed)
  [name] , Thank you for taking time to come for your Medicare Wellness Visit. I appreciate your ongoing commitment to your health goals. Please review the following plan we discussed and let me know if I can assist you in the future.   These are the goals we discussed: Goals   None     This is a list of the screening recommended for you and due dates:  Health Maintenance  Topic Date Due  . Flu Shot  02/25/2020  . Tetanus Vaccine  09/04/2021  . Colon Cancer Screening  09/16/2023  . COVID-19 Vaccine  Completed  .  Hepatitis C: One time screening is recommended by Center for Disease Control  (CDC) for  adults born from [birth year] through 1966.   Completed  . Pneumonia vaccines  Completed

## 2020-09-16 ENCOUNTER — Ambulatory Visit: Payer: Medicare Other

## 2020-09-16 ENCOUNTER — Other Ambulatory Visit: Payer: Self-pay

## 2020-09-16 DIAGNOSIS — M545 Low back pain, unspecified: Secondary | ICD-10-CM | POA: Diagnosis not present

## 2020-09-16 DIAGNOSIS — R293 Abnormal posture: Secondary | ICD-10-CM

## 2020-09-16 DIAGNOSIS — G8929 Other chronic pain: Secondary | ICD-10-CM

## 2020-09-16 DIAGNOSIS — M6281 Muscle weakness (generalized): Secondary | ICD-10-CM

## 2020-09-16 NOTE — Therapy (Signed)
[place], [place], [address] Phone: [phone]   Fax:  [phone]  Physical Therapy Treatment/Discharge Summary   Patient Details  Name: [name] MRN: [id number] Date of Birth: 1951/01/01 Referring Provider (PT): Kathyrn Sheriff, MD   Encounter Date: 09/16/2020   PT End of Session - 09/16/20 1359    Visit Number 8    Number of Visits 10    Date for PT Re-Evaluation 09/14/20    Authorization Type UHC MCR    Progress Note Due on Visit 15    PT Start Time 1400    PT Stop Time 1445    PT Time Calculation (min) 45 min    Activity Tolerance Patient tolerated treatment well    Behavior During Therapy [facility] for tasks assessed/performed           Past Medical History:  Diagnosis Date  . Allergy   . Arthritis   . BPH (benign prostatic hyperplasia)   . Complication of anesthesia    Pt. stated he had a reaction that ended in him requiring urinary cath placement  . Diverticulosis   . GERD (gastroesophageal reflux disease)    esophageal spasms  . Glaucoma   . Gout   . Head injury, closed, with concussion   . Hepatitis C    chronic - Has been treated with Harvoni  . HLD (hyperlipidemia)    statin intolerant (Crestor & Simvastatin) - Taking Livalo 8m / week  . Hypertension   . Parkinson's disease ([facility]   . Plantar fasciitis    right  . PVD (peripheral vascular disease) ([facility]    With no claudication; only mild abdominal aortic atherosclerosis noted on ultrasound.  . Thoracic ascending aortic aneurysm (HCC)    4.2 cm ascending TAA 09/2016 CT, 1 yr f/u rec  . Ulcer     Past Surgical History:  Procedure Laterality Date  . BUNIONECTOMY WITH WEIL OSTEOTOMY Right 11/02/2019   Procedure: Right Foot Lapidus, Modified McBride Bunionectomy,  Hallux Akin Osteotomy;  Surgeon: HWylene Simmer MD;  [facility]  Service: Orthopedics;  Laterality: Right;  . CARDIAC CATHETERIZATION  2005    30% Cx. Dr. GMelvern Banker . cataract surgery Left 11/05/2014  . COLONOSCOPY    . ESOPHAGOGASTRODUODENOSCOPY N/A 05/02/2015   Procedure: ESOPHAGOGASTRODUODENOSCOPY (EGD);  Surgeon: RRonald Lobo MD;  [facility];  Service: Endoscopy;  Laterality: N/A;  . ESOPHAGOGASTRODUODENOSCOPY  05/02/2015   no source of pt chest pain endoscopically evident. small hiatal hernia.  .Marland KitchenEYE SURGERY    . HARDWARE REMOVAL Right 11/02/2019   Procedure: Second Metatarsal Removal of Deep Implant and Rotational Osteotomy;  Surgeon: HWylene Simmer MD;  [place] [facility]  Service: Orthopedics;  Laterality: Right;  . MEMBRANE PEEL Left 03/14/2014   Procedure: MEMBRANE PEEL; ENDOLASER;  Surgeon: GHurman Horn MD;  [facility] [facility]  Service: Ophthalmology;  Laterality: Left;  . NM MYOVIEW LTD  2011   Neg Ischemia or infarct.  .Marland KitchenNM MYOVIEW LTD  10/2015   LOW RISK. Small, fixed basal lateral defect - likely diaphragmatic attenuation. EF 69%  . PARS PLANA VITRECTOMY Left 03/14/2014   Procedure: PARS PLANA VITRECTOMY WITH 25 GAUGE;  Surgeon: GHurman Horn MD;  [facility]  Service: Ophthalmology;  Laterality: Left;  . TONSILLECTOMY    . TRANSTHORACIC ECHOCARDIOGRAM  2013   Normal EF. No significant Valve Disease  . UPPER GASTROINTESTINAL ENDOSCOPY    . WEIL OSTEOTOMY Right 09/01/2017  Procedure: RIGHT GREAT TOE CHEVRON AND WEIL OSTEOTOMY 2ND METATARSAL;  Surgeon: Newt Minion, MD;  [place] [facility];  Service: Orthopedics;  Laterality: Right;    There were no vitals filed for this visit.   Subjective Assessment - 09/16/20 1358    Subjective Patient reports he has not had a lasting benefit or relief of back pain since injection. He explains that the most relief he gets is the 4 hours after taking pain medication, but he has not seen a notable difference in symptoms with his exercises. He verbalizes compliance with HEP. Pt states he almost fell while turning after closing the blinds at home the  other day but was able to catch himself on the [place] nearby.    Pertinent History Early onset Parkinsons. Spinal stenosis. cortisone injection in spine 3 weeks ago.    Limitations Sitting;House hold activities;Lifting;Standing;Walking    How long can you sit comfortably? comfortably about an hour    How long can you stand comfortably? "I'm not doing a lot of standing"    How long can you walk comfortably? "I'm not doing much walking outside of the house"    Diagnostic tests see chart    Patient Stated Goals to be able to move with less pain    Currently in Pain? Yes    Pain Score 7     Pain Location Back    Pain Orientation Right;Left;Posterior    Pain Descriptors / Indicators Aching    Pain Type Chronic pain    Pain Onset More than a month ago              [facility] PT Assessment - 09/16/20 0001      Assessment   Medical Diagnosis spinal stenosis with neurogenic claudication    Referring Provider (PT) Kathyrn Sheriff, MD    Onset Date/Surgical Date 06/13/20      Observation/Other Assessments   Focus on Therapeutic Outcomes (FOTO)  53% function; predicted 59% function      AROM   Overall AROM Comments Lumbar AROM WFL except pain during L rotation      Strength   Overall Strength Comments BLE MMT 4+/5 grossly except bilateral hip ABD 4/5                         [facility] Adult PT Treatment/Exercise - 09/16/20 0001      Self-Care   Other Self-Care Comments  see patient education      Lumbar Exercises: Stretches   Other Lumbar Stretch Exercise LTR x 2 min with green swiss ball      Lumbar Exercises: Aerobic   Nustep L5 x 5 min UE and LE      Lumbar Exercises: Supine   Clam Limitations 2 x 15 green theraband      Lumbar Exercises: Quadruped   [place]/Old Horse 10 reps   10x each direction   Other Quadruped Lumbar Exercises Thread the needle x 10 each direction    Other Quadruped Lumbar Exercises Child's pose forward and with lateral bias x 20 sec each                   PT Education - 09/16/20 1517    Education Details Reviewed final HEP. Discussed follow up with physician to discuss course of action moving forward due to lack of progress with PT interventions and HEP. Final FOTO intake and objective findings. Suggested inquiring about shoe lift vs heel lift (pt currently wearing heel  lift) if indicated pending follow up. Discussed continuing stretches and HEP to maintain/increase mobility and to create "targets" while walking to encourage him to take "big" steps, especially when turning, initiating gait, or going through doorways. Advised patient to perform seated forward flexion stretch 2-3 reps at least if he notices difficulty leaning forward to perform sit>stand.    Person(s) Educated Patient    Methods Explanation;Demonstration;Verbal cues;Handout    Comprehension Verbalized understanding;Returned demonstration            PT Short Term Goals - 09/16/20 1527      PT SHORT TERM GOAL #1   Title Pt to be I with initial HEP to assist with pain management    Baseline one exercise issued at eval    Time 3    Period Weeks    Status Achieved    Target Date 07/31/20      PT SHORT TERM GOAL #2   Title pt to verbalize and demo proper posture and lifting mechanics to prevent and reduce low back pain     Baseline Pt demonstrated optimal body mechanics while bending to pick up 8# dumbbell from floor    Time 3    Period Weeks    Status Achieved    Target Date 07/31/20             PT Long Term Goals - 09/16/20 1708      PT LONG TERM GOAL #1   Title Pt will be able to maintain neutral spine posture x 10 min to perform functional activities.    Baseline improved posture and ability to maintain neutral spine    Time 5    Period Weeks    Status Partially Met      PT LONG TERM GOAL #2   Title pt to be able to sit, stand and walk for >/= 60 min reporting </= 3/10 low back pain for functional mobility/ endurance required for  home/recreational tasks    Baseline unable    Time 5    Period Weeks    Status Not Met      PT LONG TERM GOAL #3   Title pt to increase FOTO score to </= 41% limited to demo improvement in function     Baseline 09/03/2020: 47% limited (53% function)    Time 5    Period Weeks    Status Partially Met      PT LONG TERM GOAL #4   Title pt to be I with all HEP given as of last visit to maintain and progress current level of function    Baseline Reports compliance with HEP    Time 5    Period Weeks    Status Achieved                 Plan - 09/16/20 1419    Clinical Impression Statement Patient D/C today due to persistent low back pain with no notable change in symptoms over the past 8 sessions of skilled PT since 07/10/2020. Pt expresses primarily having relief after taking pain medication and explains that his pain returns once medication wears off. He is able to perform all lumbar AROM WFL pain free except pain noted during L rotation in standing. FOTO reveals an improvement to 53% function compared to 40% function at initial intake. He notes improvement with heel lift during last session but states he has not worn it everyday because he did not switch it into different shoes - he explains that he  is wearing it today and feels "okay". He tolerated interventions well but did not have any significant change in pain throughout session. He performed press ups to ask if it is okay to do and states that he sometimes has temporary relief with press ups but not any different than with forward flexion, making it difficult to establish any clear directional preference. He denies significant increase or decrease in symptoms with either direction at this time. Advised patient to follow up with physician to determine course of action moving forward due to limited progress with PT.    Personal Factors and Comorbidities Fitness   pt enjoys exercise   Examination-Activity Limitations Lift;Bend;Locomotion  Level;Stand;Sit    Examination-Participation Restrictions Cleaning;Community Activity;Yard Work    [employer] Evolving/Moderate complexity    Rehab Potential Good    PT Frequency 1x / week    PT Duration 4 weeks   5 weeks   PT Treatment/Interventions ADLs/Self Care Home Management;Ultrasound;Moist Heat;Electrical Stimulation;Gait training;Stair training;Functional mobility training;Neuromuscular re-education;Balance training;Therapeutic exercise;Therapeutic activities;Patient/family education;Manual techniques;Dry needling;Passive range of motion;Taping    PT Next Visit Plan D/C today    PT Home Exercise Plan [email or web address]    Consulted and Agree with Plan of Care Patient           Patient will benefit from skilled therapeutic intervention in order to improve the following deficits and impairments:  Abnormal gait,Improper body mechanics,Pain,Postural dysfunction,Decreased mobility,Decreased activity tolerance,Decreased endurance,Decreased range of motion,Decreased strength,Hypomobility,Impaired flexibility,Difficulty walking,Decreased balance  Visit Diagnosis: Chronic midline low back pain without sciatica  Abnormal posture  Muscle weakness (generalized)     Problem List Patient Active Problem List   Diagnosis Date Noted  . Gastroesophageal reflux disease 09/10/2020  . Dry eyes, bilateral 08/15/2020  . Retinal vein occlusion 01/17/2020  . Presbycusis of right ear 01/17/2020  . 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 Secondary corneal edema of right eye 12/12/2019  . Ptosis of right eyelid 12/12/2019  . OAB (overactive bladder) 11/29/2019  . Bunion of great toe of right foot 07/14/2017  . Claw toe, acquired, right 07/14/2017  . 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  . Medication management 10/09/2016  . Plantar fasciitis of right foot 02/19/2015  . Depression 08/14/2014  . Family  history of heart disease in male family member before age 59 04/26/2014  . Mild cognitive impairment 03/13/2014  . Parkinsonian tremor ([place]) 02/12/2014  . Hyperlipidemia with target LDL less than 100   . Abdominal aortic atherosclerosis ([place])   . Moderate essential hypertension 02/25/2011  . Arthropathy 02/25/2011  . HAV (hallux abducto valgus) 02/25/2011    PHYSICAL THERAPY DISCHARGE SUMMARY  Visits from Start of Care: 8  Current functional level related to goals / functional outcomes: See above   Remaining deficits: See above   Education / Equipment: See above  Plan: Patient agrees to discharge.  Patient goals were partially met. Patient is being discharged due to the physician's request.  ?????        Haydee Monica, PT, DPT 09/16/20 5:20 PM  [place], [place], [address] Phone: [phone]   Fax:  [phone]  Name: [name] MRN: [id number] Date of Birth: [birth year]

## 2020-09-17 ENCOUNTER — Encounter: Payer: Self-pay | Admitting: Family Medicine

## 2020-09-19 ENCOUNTER — Encounter: Payer: Self-pay | Admitting: Family Medicine

## 2020-09-19 MED FILL — TRIHEXYPHENIDYL 2 MG TABLET: 2 | 30 days supply | Qty: 30 | Fill #1

## 2020-09-27 DIAGNOSIS — M5459 Other low back pain: Secondary | ICD-10-CM | POA: Diagnosis not present

## 2020-09-27 DIAGNOSIS — M7741 Metatarsalgia, right foot: Secondary | ICD-10-CM | POA: Diagnosis not present

## 2020-10-01 ENCOUNTER — Other Ambulatory Visit: Payer: Self-pay

## 2020-10-01 ENCOUNTER — Encounter: Payer: Self-pay | Admitting: Neurology

## 2020-10-01 ENCOUNTER — Ambulatory Visit: Payer: Medicare Other | Admitting: Neurology

## 2020-10-01 ENCOUNTER — Other Ambulatory Visit: Payer: Self-pay | Admitting: Neurology

## 2020-10-01 VITALS — BP 113/74 | HR 60 | Ht 70.0 in | Wt 157.2 lb

## 2020-10-01 DIAGNOSIS — G20 Parkinson's disease: Secondary | ICD-10-CM

## 2020-10-01 DIAGNOSIS — G3184 Mild cognitive impairment, so stated: Secondary | ICD-10-CM

## 2020-10-01 MED ORDER — TRIHEXYPHENIDYL HCL 2 MG PO TABS: 1.0000 mg | ORAL_TABLET | Freq: Two times a day (BID) | ORAL | 2 refills | Status: DC

## 2020-10-01 MED ORDER — CEREFOLIN 6-1-50-5 MG PO TABS: 1.0000 | ORAL_TABLET | Freq: Every day | ORAL | 3 refills | Status: DC

## 2020-10-01 NOTE — Progress Notes (Signed)
[facility] [name]: [name] DOB: Mar 16, 1951   REASON FOR VISIT: Follow-up for tremor mild cognitive impairment, depression HISTORY FROM: [name]  HISTORY OF PRESENT ILLNESS:HISTORY:63 year African American male who since last year and a half has noticed increasing tremors mainly in the left arm and leg and to a lesser degree in the right arm as well. He states the tremors were quite mild but became more pronounced after a minor accident while staying [place] the rental [place] in [place]. He fell down 12 flights of steps and hit a concrete slab and had a concussion. He and some minor bruises and knee injury which took several months to reck of her period CT scan of the head was done 2 days later which was unremarkable. Since then he has had some walking difficulty but this may be related to his knee pain. He says that his feet do at times gets stuck in his started walking with a stooped posture. He however does not describe typical festination. He has resting and intermittent action tremor claiming the left arm and leg the tremor does improve with activities. The tremor does not seem to interfere with most of his routine. He denies significant bradykinesia, and drooling of saliva or micrographia. He has been evaluated by neurologists Dr. Reginia Forts at [facility] neurolog[facility] but he is unable to tell me for the diagnosis was. He was not told that this may be Parkinson's and was not tried on dopaminergic medications. He did have an MRI scan and some lab work but I do not have those results for my review available today. [name] is here today for a second opinion as he feels his tremor is not getting better. He does admit to some light masonry hallucinations as well as restless sleep thrashing of his legs. He denies significant memory or cognitive difficulties. He has not noticed any particular effect of alcohol on his tremor. Is no family history of tremors. [name] does have  chronic hepatitis C and is planning to start treatment with dapsone. He has had no seizures, significant head injury with major loss of consciousness, stroke, TIAs or significant neurological problems.  Update 08/14/2014 : He returns for follow-up after last visit 3 months ago. He feels his tremors are about the same. He had tried reducing the dose of Sinemet but noticed that his tremors got worse and hence he went back up to the original dose which is 25/100 one tablet 3 times daily. He still feels occasionally confused and at times secondary to some cells but he admits that he worries a lot. He also admits to feeling depressed, getting tired easily and not having initiated. He has not been on any medications for depression. [name] denies significant drooling of saliva, bradykinesia, gait or balance problems. He feels his tremor is mild and does not interfere with his activities of daily living. I discussed alternative treatment options including addition of dopamine agonist, anticholinergic or even consideration for deep brain stimulation since his tremor is predominantly unilateral however the [name] does not want to consider more aggressive treatment options at the present time. UPDATE 12/26/14 [name], 70 year old black male returns for follow-up. He was last seen by Dr. [name] 08/14/2014. He feels his tremors are better since increasing his carbidopa levodopa to 1-1/2 tablets 3 times daily. He is tolerating it well without dizziness, sleepiness, nightmares or hallucinations A MRI Brain with and without Contrast completed on 07/29/2011 was normal. The Mini-Mental status exam today is unchanged 29 out  of 30. He did not follow-up with his primary care for  complaints of depression. He denies any significant drooling bradykinesia falls or balance problems. His tremor does not interfere with his activities of daily living. He returns for reevaluation Update 07/03/2015 : He returns for follow-up after last  visit 6 months ago. He states his tremors continue to do well and he seems to have responded quite well to increased dose of Sinemet 25/100 1-1/2 tablet 3 times daily. Is tolerating it well without any dizziness, sleepiness, nightmares, hallucinations, nausea or diarrhea. He continues to have mild memory difficulties but his has not been on any treatment for depression and in fact has not seen his primary care physician for the same for quite some time. Update 08/11/2016 PS : He returns for follow-up after last visit year ago. He states his tremors as well as memory loss or more or less unchanged. Continues to have intermittent tremors in the left hand mainly. The tremors fluctuate he has good days and bad days. He is currently taking Sinemet 25/100 one and half tablet 3 times daily. He complains of mild fatigue and daytime sleepiness. He denies hallucinations, delusions, dizziness or upset stomach. He does not want to try higher dose because of fear of side effects. Continues to have short-term memory difficulties. He has not been quite compulsive about taking notes and using memory compensation strategies but plans to do so. He has no new complaints today. UPDATE 07/19/2018CM [name], 70 year old male returns for follow-up with tremors and mild cognitive impairment which he says is stable. He continues to have a resting tremor in the left hand which is intermittent. He continues to have good and bad days. He remains on Sinemet 25 100 (1 and half tablets) 3 times daily. He does little exercise he is not doing any memory compensation strategies at this time and was encouraged to do so. He denies any falls. He denies any hallucinations or increased confusion. He denies any freezing spells. He does complain of left hip pain He returns for reevaluation UPDATE 1/22/2019CM [name], 39 he denies 40-year-old male returns for follow-up with a history of Parkinson's disease.  He continues to have mild resting tremor in  the left hand which is intermittent.  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  He remains on Sinemet 3 times a day however he is taking his medication with a meal.  He complains of sometimes being lightheaded when he stands up too quickly.  He was encouraged to sit on the side of the bed and then get up slightly.  He was also encouraged to stay well-hydrated.  He has not been exercising.  He denies any hallucinations or increased confusion.  He has not had any freezing spells.  He returns for reevaluation 02/15/18 UPDATE:JV  [name] returns today for six-month follow-up.  He continues to take Sinemet 3 times daily with the first dose around 6 AM (1 hour prior to eating breakfast), second dose 11 AM (1 hour prior to eating lunch) and then third dose between 5:30 and 6 PM (eats dinner around 6:30-7 PM).  [name] denies any increase in symptoms between doses but does state if he is late on a dose, he will start having increased tremors in his left upper extremity and left lower extremity.  He continues to complain of short-term memory issue and is unable to state whether it is worsening but he states he notices it more.  He did obtain a large book full  avoid games but does this approximately 1 time per week.  Recommended to do these activities at least once daily.  [name] also has complaints of feeling as though he overanalyzes certain situations and feeling increased fatigue.  [name] does endorse increased stress recently but does feel somewhat situational.  Provided [name] with stress relaxation techniques and advised him if this continues, to follow-up with PCP for possible medication management.  He denies any hallucinations or increased confusion.  He denies any freezing spells. UPDATE 1/29/2020CM [name], 70 year old male returns for follow-up with a history of Parkinson's disease and mild cognitive impairment.  He denies any freezing spells any stiffness any falls.  He denies any difficulty  swallowing.  He has spinal stenosis and had recent injection.  He is seeing Dr. Sharol Given for a foot problem.  He is currently on Sinemet 3 times a day before meals.  He has not been exercising and was encouraged to do so.  He has not had any hallucinations or increased confusion.  Noted very intermittent left tremor of the hand.  [name] reports some anxiety in public.  He was encouraged to perform stress relaxation techniques.  He returns for reevaluation  : Update 02/23/2019 :  he returns for follow-up after last visit 6 months ago.  He states his tremors are doing well until a month ago when he is noticed worsening of his tremor both at rest as well as using his hands.  He remains on Sinemet 25/100 mg 1-1/2 tablet 3 times daily.  Is tolerating them well without any nausea, diarrhea, dizziness, sleepiness or hallucinations.  He states his short-term memory difficulties are unchanged.  He has not been socializing a lot or being active but plans to do that soon.  He has no other complaints.  He denies significant drooling, bradykinesia, stooped posture gait or balance problems Update 09/04/2019 : He returns for follow-up after last visit 6 months ago.  He states that his tremors continue to intermittently not do well.  He has no more bad days than good days.  Has noticed some tremor in his legs as well intermittently.  [name] was advised to try higher dose of Sinemet 2 tablets 3 times daily at last visit.  He states he was not able to tolerate this because of dizziness and had to cut back to 1-1/2 3 times daily which is tolerating better.  He is also noticed that his balance is off but he blames this on having some problems in his toes.  He has had no major falls or injuries.  He can still get up easily from his chair.  He denies significant drooling of saliva.  He does have some at times disturbed sleep and reviewed dreams and talks in his sleep.  Gets startled easily. Update 01/10/2020 : He returns for follow-up  after last visit 4 months ago.  Is accompanied by his wife.  The [name] was started on Azilect at last visit but had trouble tolerating it daily due to sleepiness, tiredness and dizziness.  He has since switched to using Azilect 0.5 mg twice daily every other day and seems to be tolerating it better.  However his tremors still persists and appear to get worse mostly when he is anxious or with severe exertion.  He feels the tremor are not particularly disabling and he can handle it.  Continues to have poor short-term memory and needs to write things down but still remembers only occasionally.  Sometimes he may remember later.  Wife  notes that occasionally he may pause in midsentence and searches for words.  At times even starts stuttering as he is trying to find the word.  He has no family history of Alzheimer's or dementia.  Still fairly independent and manages most activities for himself.  He remains currently on Sinemet 25/100 1.5 tablets 4 times daily which is tolerating well without significant nightmares, hallucinations, daytime sleepiness or nausea. Update 06/28/2020 : He returns for follow-up after last visit 6 months ago.  He is accompanied by his wife.  He continues to have intermittent tremors mostly in the left hand which are bothersome and he has good days and bad days.  [name] still continues to take Artane 2 mg tablets only half a tablet twice daily on alternate days and not every day as instructed at last visit.  He remains on Sinemet 25/100 mg 1.5 tablets 3 times daily and he has had trouble tolerating higher doses in the past.  Is not having significant dizziness or upset stomach but is still having some disturbed sleep with crazy dreams.  He denies hallucinations or delusions.  He is careful with his walking and has had a few near falls but no major falls or injuries.  He denies excessive drooling of saliva or bradykinesia or mobility issues.  He does admit to decreased short-term memory,  attention and concentration difficulties but these appear to be stable and unchanged. Update 10/01/2020 : He returns for follow-up after last visit 3 months ago.  [name] did not tolerate increasing the dose of Artane to 2 mg daily as he developed increased dryness of mouth as well as bad dreams.  He has since reduced the dose back to 1 mg daily and is feeling better.  He still has intermittent resting tremor in both left hand as well as leg which does interfere with his activities of daily living slightly.  He remains on Sinemet 25/100 mg 1.5 tablets 3 times daily which is tolerating quite well without any dizziness, sleepiness, upset stomach, nightmares or hallucinations.  He notices occasional lip twitchings in the morning only.  He has no bradykinesia or increased drooling.  He can get up easily and to most of his activities without help.  He continues to have short-term memory problems which appear unchanged.  He forgets recent appointments and events.  He states he has decreased hearing bilaterally which is also seems to complicate his understanding and memory.  He is however quite independent in activities of daily living. REVIEW OF SYSTEMS: Full 14 system review of systems performed and notable only for those listed, near falls, crazy dreams tremors, imbalance, dizziness and memory difficulties only and all other systems negative ALLERGIES: No Known Allergies  HOME MEDICATIONS: Outpatient Medications Prior to Visit  Medication Sig Dispense Refill  . Acetaminophen (ACETAMIN PO) Take 500 mg by mouth every 4 (four) hours as needed (pain).     [name] alfuzosin (UROXATRAL) 10 MG 24 hr tablet Take 10 mg by mouth at bedtime.     . calcium-vitamin D (OSCAL WITH D) 250-125 MG-UNIT tablet Take 1 tablet by mouth daily.    . carbidopa-levodopa (SINEMET IR) 25-100 MG tablet TAKE 1 AND 1/2 TABLETS BY MOUTH THREE TIMES DAILY 405 tablet 1  . citalopram (CELEXA) 40 MG tablet Take 1 tablet (40 mg total) by mouth daily.  30 tablet 3  . docusate sodium (COLACE) 100 MG capsule Take 1 capsule (100 mg total) by mouth daily as needed. 30 capsule 2  . irbesartan (AVAPRO)  300 MG tablet TAKE 1 TABLET BY MOUTH EVERY MORNING 30 tablet 11  . LIVALO 1 MG TABS TAKE 1 TABLET (1 MG TOTAL) BY MOUTH 2 TIMES A WEEK. TAKES ON MONDAY AND FRIDAY 25 tablet 3  . nitroGLYCERIN (NITROSTAT) 0.4 MG SL tablet PLACE 1 TABLET UNDER THE TONGUE EVERY 5 MINUTES AS NEEDED FOR CHEST PAIN. 25 tablet 3  . pantoprazole (PROTONIX) 40 MG tablet Take 40 mg by mouth 2 (two) times daily.  0  . senna (SENOKOT) 8.6 MG TABS tablet Take 2 tablets (17.2 mg total) by mouth 2 (two) times daily. 30 tablet 0  . simvastatin (ZOCOR) 40 MG tablet Take 1 tablet (40 mg total) by mouth at bedtime. 90 tablet 3  . timolol (TIMOPTIC) 0.5 % ophthalmic solution Place 1 drop into the left eye daily.     . verapamil (CALAN-SR) 240 MG CR tablet TAKE 1 TABLET (240 MG TOTAL) BY MOUTH AT BEDTIME. 90 tablet 3  . Multiple Vitamin (MULTIVITAMIN WITH MINERALS) TABS tablet Take 1 tablet by mouth 4 (four) times a week.     . trihexyphenidyl (ARTANE) 2 MG tablet TAKE 1/2 TABLET BY MOUTH TWO TIMES DAILY WITH MEALS 30 tablet 2   No facility-administered medications prior to visit.    PAST MEDICAL HISTORY: Past Medical History:  Diagnosis Date  . Allergy   . Arthritis   . BPH (benign prostatic hyperplasia)   . Complication of anesthesia    Pt. stated he had a reaction that ended in him requiring urinary cath placement  . Diverticulosis   . GERD (gastroesophageal reflux disease)    esophageal spasms  . Glaucoma   . Gout   . Head injury, closed, with concussion   . Hepatitis C    chronic - Has been treated with Harvoni  . HLD (hyperlipidemia)    statin intolerant (Crestor & Simvastatin) - Taking Livalo 1mg  / week  . Hypertension   . Parkinson's disease ([place])   . Plantar fasciitis    right  . PVD (peripheral vascular disease) ([place])    With no claudication; only mild  abdominal aortic atherosclerosis noted on ultrasound.  . Thoracic ascending aortic aneurysm ([facility])    4.2 cm ascending TAA 09/2016 CT, 1 yr f/u rec  . Ulcer     PAST SURGICAL HISTORY: Past Surgical History:  Procedure Laterality Date  . BUNIONECTOMY WITH WEIL OSTEOTOMY Right 11/02/2019   Procedure: Right Foot Lapidus, Modified McBride Bunionectomy,  Hallux Akin Osteotomy;  Surgeon: Wylene Simmer, MD;  Location: [place];  Service: Orthopedics;  Laterality: Right;  . CARDIAC CATHETERIZATION  2005   30% Cx. Dr. Melvern Banker  . cataract surgery Left 11/05/2014  . COLONOSCOPY    . ESOPHAGOGASTRODUODENOSCOPY N/A 05/02/2015   Procedure: ESOPHAGOGASTRODUODENOSCOPY (EGD);  Surgeon: Ronald Lobo, MD;  Location: [facility];  Service: Endoscopy;  Laterality: N/A;  . ESOPHAGOGASTRODUODENOSCOPY  05/02/2015   no source of pt chest pain endoscopically evident. small hiatal hernia.  [name] EYE SURGERY    . HARDWARE REMOVAL Right 11/02/2019   Procedure: Second Metatarsal Removal of Deep Implant and Rotational Osteotomy;  Surgeon: Wylene Simmer, MD;  Location: [place];  Service: Orthopedics;  Laterality: Right;  . MEMBRANE PEEL Left 03/14/2014   Procedure: MEMBRANE PEEL; ENDOLASER;  Surgeon: Hurman Horn, MD;  Location: [place];  Service: Ophthalmology;  Laterality: Left;  . NM MYOVIEW LTD  2011   Neg Ischemia or infarct.  [name] NM MYOVIEW LTD  10/2015   LOW  RISK. Small, fixed basal lateral defect - likely diaphragmatic attenuation. EF 69%  . PARS PLANA VITRECTOMY Left 03/14/2014   Procedure: PARS PLANA VITRECTOMY WITH 25 GAUGE;  Surgeon: Hurman Horn, MD;  Location: [place];  Service: Ophthalmology;  Laterality: Left;  . TONSILLECTOMY    . TRANSTHORACIC ECHOCARDIOGRAM  2013   Normal EF. No significant Valve Disease  . UPPER GASTROINTESTINAL ENDOSCOPY    . WEIL OSTEOTOMY Right 09/01/2017   Procedure: RIGHT GREAT TOE CHEVRON AND WEIL OSTEOTOMY 2ND METATARSAL;  Surgeon: Newt Minion, MD;   Location: [place];  Service: Orthopedics;  Laterality: Right;    FAMILY HISTORY: Family History  Problem Relation Age of Onset  . Heart disease Mother   . Cancer Paternal Grandfather   . Cancer Sister     SOCIAL HISTORY: Social History   Socioeconomic History  . Marital status: Married    Spouse name: barbra gen  . Number of children: 3  . Years of education: AS  . Highest education level: Not on file  Occupational History  . 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 Smoking status: Former Smoker    Types: Cigars    Quit date: 07/27/1988    Years since quitting: 32.2  . Smokeless tobacco: Never Used  Vaping Use  . Vaping Use: Never used  Substance and Sexual Activity  . Alcohol use: Yes    Alcohol/week: 3.0 standard drinks    Types: 1 Glasses of wine, 1 Cans of beer, 1 Shots of liquor per week    Comment: occasional  . Drug use: No  . Sexual activity: Yes  Other Topics Concern  . Not on file  Social History Narrative   Lives with wife    Right handed   Drinks 1-2 cups caffeine daily   Social Determinants of Health   Financial Resource Strain: Not on file  Food Insecurity: Not on file  Transportation Needs: Not on file  Physical Activity: Not on file  Stress: Not on file  Social Connections: Not on file  Intimate Partner Violence: Not on file     PHYSICAL EXAM  Vitals:   10/01/20 1316  BP: 113/74  Pulse: 60  Weight: 157 lb 3.2 oz (71.3 kg)  Height: 5\' 10"  (1.778 m)   Body mass index is 22.56 kg/m.  Generalized: Frail elderly African-American male in no acute distress   Head: normocephalic and atraumatic,.   Neck: Supple,  Musculoskeletal: No deformity right foot boot for recent bunion surgery  Neurological examination   Mentation: Alert. Clock drawing 4/4, able to name 11 animals which walk on 4 legs.  Normal  Recall 3/3.  Able to name 13 animals which can walk on 4 legs. MMSE - Mini Mental  State Exam 08/24/2018 05/11/2014  Orientation to time 5 5  Orientation to Place 5 5  Registration 3 3  Attention/ Calculation 5 5  Recall 2 2  Language- name 2 objects 2 2  Language- repeat 1 1  Language- follow 3 step command 3 3  Language- read & follow direction 1 1  Write a sentence 1 1  Write a sentence-comments no subject -  Copy design 1 1  Total score 29 29  Follows all commands speech and language fluent.  Weakly positive glabellar tap.  Slightly diminished facial expression. Cranial nerve II-XII: Pupils were equal round reactive to light extraocular movements were full, visual field were full on  confrontational test. Facial sensation and strength were normal. hearing was intact to finger rubbing bilaterally. Uvula tongue midline. head turning and shoulder shrug were normal and symmetric.Tongue protrusion into cheek strength was normal. Motor: normal bulk and tone, full strength in the BUE, BLE, .  Diminished amplitude of finger tapping on the left with fatigue, intermittent left hand and leg resting tremor, mild cogwheel rigidity upon activation at the left wrist.. Tremor improves with action and intention.  No bradykinesia Sensory: normal and symmetric to light touch, on the face arms and legs  Coordination: finger-nose-finger, heel-to-shin bilaterally, no dysmetria Reflexes: 1+ upper lower and symmetric plantar responses were flexor bilaterally. Gait and Station: Rising up from seated position without assistance, no festination or stooped posture.  Ambulated well with moderate stride, decreased  arm swing on the left., smooth turning, no assistive device.  DIAGNOSTIC DATA (LABS, IMAGING, TESTING) - I reviewed [name] records, labs, notes, testing and imaging myself where available.  Lab Results  Component Value Date   WBC 6.1 09/10/2020   HGB 14.1 09/10/2020   HCT 41.2 09/10/2020   MCV 92 09/10/2020   PLT 243 09/10/2020      Component Value Date/Time   NA 137 09/10/2020  1058   K 4.3 09/10/2020 1058   CL 101 09/10/2020 1058   CO2 22 09/10/2020 1058   GLUCOSE 96 09/10/2020 1058   GLUCOSE 113 (H) 04/19/2018 0411   BUN 22 09/10/2020 1058   CREATININE 1.24 09/10/2020 1058   CREATININE 1.06 06/29/2014 0920   CALCIUM 9.4 09/10/2020 1058   PROT 7.4 09/10/2020 1058   ALBUMIN 4.8 09/10/2020 1058   AST 16 09/10/2020 1058   ALT 10 09/10/2020 1058   ALKPHOS 74 09/10/2020 1058   BILITOT 0.5 09/10/2020 1058   GFRNONAA 59 (L) 09/10/2020 1058   GFRAA 68 09/10/2020 1058   Lab Results  Component Value Date   CHOL 175 09/10/2020   HDL 63 09/10/2020   LDLCALC 104 (H) 09/10/2020   TRIG 38 09/10/2020   CHOLHDL 2.8 09/10/2020     Lab Results  Component Value Date   TSH 1.950 09/28/2016    ASSESSMENT AND PLAN 71 y.o. 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 Good response to Sinemet but recent worsening of tremors but has been unable to tolerate higher dose due to side effects.  [name] is reluctant to consider deep brain stimulation.[name] He also complains of mild cognitive impairment which appears stable.    PLAN:I had a long discussion with the [name] and his wife regarding his parkinsonian tremor which appears suboptimally controlled on the current dosage of Artane hence I recommend increasing it to 1 mg twice daily if tolerated.  Continue Sinemet and the current dose of 25/100 mg 1.5 tablets 3 times daily.  We also discussed treatment options like deep brain stimulation for his refractory tremors since he is unable to tolerate higher doses but he wants to think about it and will let me know later.  I also encouraged him to try Cerefolin NAC 1 tablet daily for his mild cognitive impairment and increase participation in cognitively challenging activities like solving crossword puzzles, playing bridge and sodoku.  We also discussed memory compensation strategies  he will return for follow-up in the future in 6 months with my nurse practitioner Janett Billow or call earlier if necessary. I spent 25 minutes in total face to face  time with the [name] more than 50% of which was spent counseling and coordination of care, reviewing test results reviewing medications and discussing and reviewing the diagnosis of Parkinson's disease and mild cognitive impairment and importance of cognitively challenging activities  Antony Contras, MD   [facility] [id number] [address] [place],  [address] [phone]

## 2020-10-01 NOTE — Patient Instructions (Signed)
I had a long discussion with the patient and his wife regarding his parkinsonian tremor which appears suboptimally controlled on the current dosage of Artane hence I recommend increasing it to 1 mg twice daily if tolerated.  Continue Sinemet and the current dose of 25/100 mg 1.5 tablets 3 times daily.  We also discussed treatment options like deep brain stimulation for his refractory tremors since he is unable to tolerate higher doses but he wants to think about it and will let me know later.  I also encouraged him to try Cerefolin NAC 1 tablet daily for his mild cognitive impairment and increase participation in cognitively challenging activities like solving crossword puzzles, playing bridge and sodoku.  We also discussed memory compensation strategies he will return for follow-up in the future in 6 months with my nurse practitioner Janett Billow or call earlier if necessary. Memory Compensation Strategies  1. Use "WARM" strategy.  W= write it down  A= associate it  R= repeat it  M= make a mental note  2.   You can keep a Social worker.  Use a 3-ring notebook with sections for the following: calendar, important names and phone numbers,  medications, doctors' names/phone numbers, lists/reminders, and a section to journal what you did  each day.   3.    Use a calendar to write appointments down.  4.    Write yourself a schedule for the day.  This can be placed on the calendar or in a separate section of the Memory Notebook.  Keeping a  regular schedule can help memory.  5.    Use medication organizer with sections for each day or morning/evening pills.  You may need help loading it  6.    Keep a basket, or pegboard by the door.  Place items that you need to take out with you in the basket or on the pegboard.  You may also want to  include a message board for reminders.  7.    Use sticky notes.  Place sticky notes with reminders in a place where the task is performed.  For example: " turn off the   stove" placed by the stove, "lock the door" placed on the door at eye level, " take your medications" on  the bathroom mirror or by the place where you normally take your medications.  8.    Use alarms/timers.  Use while cooking to remind yourself to check on food or as a reminder to take your medicine, or as a  reminder to make a call, or as a reminder to perform another task, etc.

## 2020-10-15 ENCOUNTER — Telehealth: Payer: Self-pay | Admitting: Emergency Medicine

## 2020-10-15 ENCOUNTER — Encounter: Payer: Self-pay | Admitting: Emergency Medicine

## 2020-10-15 DIAGNOSIS — M7741 Metatarsalgia, right foot: Secondary | ICD-10-CM | POA: Diagnosis not present

## 2020-10-15 NOTE — Telephone Encounter (Signed)
Called patient and made aware letter is ready.  Patient stated he would pick it up.  Will be at front desk.  Patient denied further questions, verbalized understanding and expressed appreciation for the phone call.

## 2020-10-21 DIAGNOSIS — M5459 Other low back pain: Secondary | ICD-10-CM | POA: Diagnosis not present

## 2020-10-28 ENCOUNTER — Other Ambulatory Visit (HOSPITAL_COMMUNITY): Payer: Self-pay

## 2020-10-28 MED FILL — Trihexyphenidyl HCl Tab 2 MG: ORAL | 30 days supply | Qty: 30 | Fill #0 | Status: AC

## 2020-10-29 ENCOUNTER — Other Ambulatory Visit (HOSPITAL_COMMUNITY): Payer: Self-pay

## 2020-11-06 ENCOUNTER — Other Ambulatory Visit (HOSPITAL_COMMUNITY): Payer: Self-pay

## 2020-11-06 MED FILL — Irbesartan Tab 300 MG: ORAL | 30 days supply | Qty: 30 | Fill #0 | Status: AC

## 2020-11-07 ENCOUNTER — Other Ambulatory Visit: Payer: Self-pay

## 2020-11-07 ENCOUNTER — Other Ambulatory Visit (HOSPITAL_COMMUNITY): Payer: Self-pay

## 2020-11-07 ENCOUNTER — Ambulatory Visit (INDEPENDENT_AMBULATORY_CARE_PROVIDER_SITE_OTHER): Payer: Medicare Other

## 2020-11-07 DIAGNOSIS — Z23 Encounter for immunization: Secondary | ICD-10-CM | POA: Diagnosis not present

## 2020-11-12 ENCOUNTER — Ambulatory Visit (INDEPENDENT_AMBULATORY_CARE_PROVIDER_SITE_OTHER): Payer: Medicare Other | Admitting: Family Medicine

## 2020-11-12 ENCOUNTER — Other Ambulatory Visit: Payer: Self-pay

## 2020-11-12 ENCOUNTER — Encounter: Payer: Self-pay | Admitting: Family Medicine

## 2020-11-12 VITALS — BP 128/74 | HR 79 | Temp 96.0°F | Wt 159.2 lb

## 2020-11-12 DIAGNOSIS — R202 Paresthesia of skin: Secondary | ICD-10-CM

## 2020-11-12 NOTE — Progress Notes (Signed)
   Subjective:    Patient ID: [name], male    DOB: 06-28-1951, 70 y.o.   MRN: [id number]  HPI He states that over the last 2 weeks he has noted a tingling and tight feeling in the left facial area that is intermittent in nature having 2 or 3/day lasting 15 to 20 minutes.  He notes no associated facial muscle weakness, slurred speech, blurred or double vision, weakness in any other part of his body.  He continues on medications listed in the chart.  He does see neurology and is scheduled to be seen again in September.   Review of Systems     Objective:   Physical Exam EOMI.  Other cranial nerves grossly intact.  Normal sensation to the face.  No motor weakness noted of the face.  No carotid bruits noted.  DTRs normal.       Assessment & Plan:  Tingling sensation in face I explained that at this time, I see no evidence of stroke.  Explained that this could possibly be related to Parkinson's disease.  Recommend watchful waiting and if this continues, he will call and I will refer to neurology for further evaluation.  Did recommend taking a baby aspirin every day.

## 2020-11-13 DIAGNOSIS — M5416 Radiculopathy, lumbar region: Secondary | ICD-10-CM | POA: Diagnosis not present

## 2020-11-19 ENCOUNTER — Other Ambulatory Visit (HOSPITAL_COMMUNITY): Payer: Self-pay

## 2020-11-19 DIAGNOSIS — M48061 Spinal stenosis, lumbar region without neurogenic claudication: Secondary | ICD-10-CM | POA: Diagnosis not present

## 2020-11-19 MED FILL — Citalopram Hydrobromide Tab 40 MG (Base Equiv): ORAL | 30 days supply | Qty: 30 | Fill #0 | Status: AC

## 2020-11-21 ENCOUNTER — Other Ambulatory Visit (HOSPITAL_COMMUNITY): Payer: Self-pay

## 2020-11-21 DIAGNOSIS — M7741 Metatarsalgia, right foot: Secondary | ICD-10-CM | POA: Diagnosis not present

## 2020-11-21 MED FILL — Verapamil HCl Tab ER 240 MG: ORAL | 90 days supply | Qty: 90 | Fill #0 | Status: AC

## 2020-11-25 DIAGNOSIS — M5416 Radiculopathy, lumbar region: Secondary | ICD-10-CM | POA: Insufficient documentation

## 2020-11-26 ENCOUNTER — Other Ambulatory Visit (HOSPITAL_COMMUNITY): Payer: Self-pay

## 2020-11-26 ENCOUNTER — Telehealth: Payer: Self-pay

## 2020-11-26 ENCOUNTER — Other Ambulatory Visit: Payer: Self-pay | Admitting: Urology

## 2020-11-26 MED FILL — Trihexyphenidyl HCl Tab 2 MG: ORAL | 30 days supply | Qty: 30 | Fill #1 | Status: AC

## 2020-11-26 MED FILL — Carbidopa & Levodopa Tab 25-100 MG: ORAL | 90 days supply | Qty: 405 | Fill #0 | Status: AC

## 2020-11-26 NOTE — Telephone Encounter (Signed)
Pt wife was advised that per Dr. Redmond School the renal cyst are not  A concern but if they would like a virtual appointment we could schedule one to talk about it. Pt wife declined. Daisetta

## 2020-11-27 ENCOUNTER — Telehealth: Payer: Self-pay | Admitting: Cardiology

## 2020-11-27 ENCOUNTER — Other Ambulatory Visit (HOSPITAL_COMMUNITY): Payer: Self-pay

## 2020-11-27 ENCOUNTER — Other Ambulatory Visit: Payer: Self-pay | Admitting: *Deleted

## 2020-11-27 ENCOUNTER — Other Ambulatory Visit: Payer: Self-pay | Admitting: Urology

## 2020-11-27 MED ORDER — NITROGLYCERIN 0.4 MG SL SUBL
0.4000 mg | SUBLINGUAL_TABLET | SUBLINGUAL | 6 refills | Status: DC | PRN
  Filled 2020-11-27: qty 25, 5d supply, fill #0
  Filled 2020-12-20: qty 25, 5d supply, fill #1

## 2020-11-27 MED ORDER — ALFUZOSIN HCL ER 10 MG PO TB24
10.0000 mg | ORAL_TABLET | Freq: Every day | ORAL | 1 refills | Status: DC
  Filled 2020-11-27: qty 90, 90d supply, fill #0

## 2020-11-27 NOTE — Telephone Encounter (Signed)
*  STAT* If patient is at the pharmacy, call can be transferred to refill team.   1. Which medications need to be refilled? (please list name of each medication and dose if known) nitroGLYCERIN (NITROSTAT) 0.4 MG SL tablet  2. Which pharmacy/location (including street and city if local pharmacy) is medication to be sent to?[place], [place]  3. Do they need a 30 day or 90 day supply? 90 day supply    PT is out of this medication.

## 2020-11-27 NOTE — Telephone Encounter (Signed)
Received a fax request from [facility] to renew NTG  Sublingual prescription.

## 2020-11-28 ENCOUNTER — Other Ambulatory Visit (HOSPITAL_COMMUNITY): Payer: Self-pay

## 2020-11-28 MED ORDER — ALFUZOSIN HCL ER 10 MG PO TB24
ORAL_TABLET | Freq: Every day | ORAL | 3 refills | Status: AC
  Filled 2020-11-28 – 2021-03-05 (×2): qty 90, 90d supply, fill #0
  Filled 2021-06-08: qty 90, 90d supply, fill #1
  Filled 2021-09-07: qty 90, 90d supply, fill #2

## 2020-11-28 MED ORDER — NITROGLYCERIN 0.4 MG SL SUBL: 0.4000 mg | SUBLINGUAL_TABLET | SUBLINGUAL | 2 refills | Status: DC | PRN

## 2020-11-28 NOTE — Telephone Encounter (Signed)
Rx(s) sent to pharmacy electronically.  

## 2020-11-29 ENCOUNTER — Other Ambulatory Visit (HOSPITAL_COMMUNITY): Payer: Self-pay

## 2020-12-03 ENCOUNTER — Other Ambulatory Visit (HOSPITAL_COMMUNITY): Payer: Self-pay

## 2020-12-03 ENCOUNTER — Encounter: Payer: Self-pay | Admitting: Family Medicine

## 2020-12-04 DIAGNOSIS — M48061 Spinal stenosis, lumbar region without neurogenic claudication: Secondary | ICD-10-CM | POA: Diagnosis not present

## 2020-12-04 DIAGNOSIS — M545 Low back pain, unspecified: Secondary | ICD-10-CM | POA: Diagnosis not present

## 2020-12-06 DIAGNOSIS — M545 Low back pain, unspecified: Secondary | ICD-10-CM | POA: Diagnosis not present

## 2020-12-06 DIAGNOSIS — M48061 Spinal stenosis, lumbar region without neurogenic claudication: Secondary | ICD-10-CM | POA: Diagnosis not present

## 2020-12-07 DIAGNOSIS — M5416 Radiculopathy, lumbar region: Secondary | ICD-10-CM | POA: Diagnosis not present

## 2020-12-07 DIAGNOSIS — M48061 Spinal stenosis, lumbar region without neurogenic claudication: Secondary | ICD-10-CM | POA: Diagnosis not present

## 2020-12-09 ENCOUNTER — Other Ambulatory Visit (HOSPITAL_COMMUNITY): Payer: Self-pay

## 2020-12-09 MED FILL — Irbesartan Tab 300 MG: ORAL | 30 days supply | Qty: 30 | Fill #1 | Status: AC

## 2020-12-11 DIAGNOSIS — M48061 Spinal stenosis, lumbar region without neurogenic claudication: Secondary | ICD-10-CM | POA: Diagnosis not present

## 2020-12-11 DIAGNOSIS — M545 Low back pain, unspecified: Secondary | ICD-10-CM | POA: Diagnosis not present

## 2020-12-12 ENCOUNTER — Telehealth: Payer: Self-pay | Admitting: Cardiology

## 2020-12-12 ENCOUNTER — Encounter (INDEPENDENT_AMBULATORY_CARE_PROVIDER_SITE_OTHER): Payer: Medicare Other | Admitting: Ophthalmology

## 2020-12-12 NOTE — Telephone Encounter (Signed)
Returned call to patient no answer.Left message on personal voice mail to call back. 

## 2020-12-12 NOTE — Telephone Encounter (Signed)
Pt c/o of Chest Pain: STAT if CP now or developed within 24 hours  1. Are you having CP right now? NO   2. Are you experiencing any other symptoms (ex. SOB, nausea, vomiting, sweating)?  SENSATION IN HIS CHEST AND RIGHT AND LEFT ARM, TINGLING IN THE RIGHT SIDE OF HIS FACE, CONGESTION IN CHEST  3. How long have you been experiencing CP?  TWO WEEKS   4. Is your CP continuous or coming and going? COMES AND GOES   5. Have you taken Nitroglycerin?  YES ? WARM WATER AND NYTROGLYCERIN HELPS RELIEVE THIS SYMPTOM, PAIN FOR TWO WEEKS, COMES AND GOES, PT WOULD LIKE TO BE SEEN AND SPEAK W/ A NURSE.

## 2020-12-13 DIAGNOSIS — M48061 Spinal stenosis, lumbar region without neurogenic claudication: Secondary | ICD-10-CM | POA: Diagnosis not present

## 2020-12-13 DIAGNOSIS — M545 Low back pain, unspecified: Secondary | ICD-10-CM | POA: Diagnosis not present

## 2020-12-13 NOTE — Telephone Encounter (Signed)
Called left message for patient on secure voicemail .   Schedule an appointment 12/24/20 at 8:40 am . Recommend patient to got ER if chest pain that is unrelieved by Nitroglycerin.  any question may cal back

## 2020-12-13 NOTE — Telephone Encounter (Signed)
Received a call back from patient he stated he has been having chest pain radiating down left arm,tingling left side of face off and on for the past 3 weeks.Stated pain is relieved by NTG x 1 each time.No pain at present.Refused appointment with extender,only wants to see [facility].Advised Dr.Harding is out of office today and next week.No appointments available.I will send message to [facility] and his RN.Advised to go to ED if needed.

## 2020-12-16 ENCOUNTER — Encounter (INDEPENDENT_AMBULATORY_CARE_PROVIDER_SITE_OTHER): Payer: Self-pay | Admitting: Ophthalmology

## 2020-12-16 ENCOUNTER — Other Ambulatory Visit: Payer: Self-pay

## 2020-12-16 ENCOUNTER — Ambulatory Visit (INDEPENDENT_AMBULATORY_CARE_PROVIDER_SITE_OTHER): Payer: Medicare Other | Admitting: Ophthalmology

## 2020-12-16 DIAGNOSIS — H4051X0 Glaucoma secondary to other eye disorders, right eye, stage unspecified: Secondary | ICD-10-CM | POA: Diagnosis not present

## 2020-12-16 DIAGNOSIS — H18231 Secondary corneal edema, right eye: Secondary | ICD-10-CM

## 2020-12-16 DIAGNOSIS — H34812 Central retinal vein occlusion, left eye, with macular edema: Secondary | ICD-10-CM

## 2020-12-16 NOTE — Assessment & Plan Note (Signed)
No ongoing therapy required, much less CME, no active disease will continue to observe

## 2020-12-16 NOTE — Progress Notes (Signed)
12/16/2020     CHIEF COMPLAINT Patient presents for Retina Follow Up (4 month fu ou and fp/Pt states, "I do not think that my va is doing so well. I am not sure if it is my new rx or my eyes deteriorating but my vision seems less clear and more blurred. I also do have new floaters in my OS."/Pt reports using Timolol QDAY OU)   HISTORY OF PRESENT ILLNESS: [name] is a 70 y.o. male who presents to the clinic today for:   HPI     Retina Follow Up     Diagnosis: CRVO/BRVO   Laterality: left eye   Onset: 4 months ago   Severity: mild   Duration: 4 months   Course: gradually worsening   Comments: 4 month fu ou and fp Pt states, "I do not think that my va is doing so well. I am not sure if it is my new rx or my eyes deteriorating but my vision seems less clear and more blurred. I also do have new floaters in my OS." Pt reports using Timolol QDAY OU        Last edited by Kendra Opitz, COA on 12/16/2020 10:42 AM. (History)       Referring physician: Denita Lung, MD [place],  [place] [id number]  HISTORICAL INFORMATION:   Selected notes from the MEDICAL RECORD NUMBER       CURRENT MEDICATIONS: Current Outpatient Medications (Ophthalmic Drugs)  Medication Sig   timolol (TIMOPTIC) 0.5 % ophthalmic solution Place 1 drop into the left eye daily.    No current facility-administered medications for this visit. (Ophthalmic Drugs)   Current Outpatient Medications (Other)  Medication Sig   Acetaminophen (ACETAMIN PO) Take 500 mg by mouth every 4 (four) hours as needed (pain).    alfuzosin (UROXATRAL) 10 MG 24 hr tablet Take 10 mg by mouth at bedtime.    alfuzosin (UROXATRAL) 10 MG 24 hr tablet TAKE 1 TABLET BY MOUTH ONCE A DAY   alfuzosin (UROXATRAL) 10 MG 24 hr tablet Take 1 tablet (10 mg total) by mouth daily.   calcium-vitamin D (OSCAL WITH D) 250-125 MG-UNIT tablet Take 1 tablet by mouth daily.   carbidopa-levodopa (SINEMET IR) 25-100 MG tablet  TAKE 1 AND 1/2 TABLETS BY MOUTH THREE TIMES DAILY   citalopram (CELEXA) 40 MG tablet TAKE 1 TABLET BY MOUTH ONCE A DAY   irbesartan (AVAPRO) 300 MG tablet TAKE 1 TABLET BY MOUTH EVERY MORNING   L-Methylfolate-B12-B6-B2 (CEREFOLIN) 12-25-48-5 MG TABS Take 1 tablet by mouth daily.   nitroGLYCERIN (NITROSTAT) 0.4 MG SL tablet Place 1 tablet (0.4 mg total) under the tongue every 5 (five) minutes as needed for chest pain.   nitroGLYCERIN (NITROSTAT) 0.4 MG SL tablet Place 1 tablet (0.4 mg total) under the tongue every 5 (five) minutes as needed for chest pain.   Omega 3 340 MG CPDR 2 capsules   pantoprazole (PROTONIX) 40 MG tablet Take 40 mg by mouth 2 (two) times daily.   Pitavastatin Calcium 1 MG TABS TAKE 1 TABLET BY MOUTH TWO TIMES A WEEK ON MONDAY AND FRIDAY   senna (SENOKOT) 8.6 MG TABS tablet Take 2 tablets (17.2 mg total) by mouth 2 (two) times daily.   simvastatin (ZOCOR) 40 MG tablet TAKE 1 TABLET BY MOUTH AT BEDTIME   trihexyphenidyl (ARTANE) 2 MG tablet TAKE 1/2 TABLET BY MOUTH 2 TIMES DAILY WITH A MEAL   verapamil (CALAN-SR) 240 MG CR tablet TAKE  1 TABLET BY MOUTH AT BEDTIME.   No current facility-administered medications for this visit. (Other)      REVIEW OF SYSTEMS:    ALLERGIES No Known Allergies  PAST MEDICAL HISTORY Past Medical History:  Diagnosis Date   Allergy    Arthritis    BPH (benign prostatic hyperplasia)    Complication of anesthesia    Pt. stated he had a reaction that ended in him requiring urinary cath placement   Diverticulosis    GERD (gastroesophageal reflux disease)    esophageal spasms   Glaucoma    Gout    Head injury, closed, with concussion    Hepatitis C    chronic - Has been treated with Harvoni   HLD (hyperlipidemia)    statin intolerant (Crestor & Simvastatin) - Taking Livalo 1mg  / week   Hypertension    Parkinson's disease ([place])    Plantar fasciitis    right   PVD (peripheral vascular disease) ([place])    With no claudication; only  mild abdominal aortic atherosclerosis noted on ultrasound.   Thoracic ascending aortic aneurysm (HCC)    4.2 cm ascending TAA 09/2016 CT, 1 yr f/u rec   Ulcer    Past Surgical History:  Procedure Laterality Date   BUNIONECTOMY WITH WEIL OSTEOTOMY Right 11/02/2019   Procedure: Right Foot Lapidus, Modified McBride Bunionectomy,  Hallux Akin Osteotomy;  Surgeon: Wylene Simmer, MD;  Location: [facility];  Service: Orthopedics;  Laterality: Right;   CARDIAC CATHETERIZATION  2005   30% Cx. Dr. Melvern Banker   cataract surgery Left 11/05/2014   COLONOSCOPY     ESOPHAGOGASTRODUODENOSCOPY N/A 05/02/2015   Procedure: ESOPHAGOGASTRODUODENOSCOPY (EGD);  Surgeon: Ronald Lobo, MD;  Location: [facility];  Service: Endoscopy;  Laterality: N/A;   ESOPHAGOGASTRODUODENOSCOPY  05/02/2015   no source of pt chest pain endoscopically evident. small hiatal hernia.   EYE SURGERY     HARDWARE REMOVAL Right 11/02/2019   Procedure: Second Metatarsal Removal of Deep Implant and Rotational Osteotomy;  Surgeon: Wylene Simmer, MD;  Location: [facility];  Service: Orthopedics;  Laterality: Right;   MEMBRANE PEEL Left 03/14/2014   Procedure: MEMBRANE PEEL; ENDOLASER;  Surgeon: Hurman Horn, MD;  Location: [facility];  Service: Ophthalmology;  Laterality: Left;   NM MYOVIEW LTD  2011   Neg Ischemia or infarct.   NM MYOVIEW LTD  10/2015   LOW RISK. Small, fixed basal lateral defect - likely diaphragmatic attenuation. EF 69%   PARS PLANA VITRECTOMY Left 03/14/2014   Procedure: PARS PLANA VITRECTOMY WITH 25 GAUGE;  Surgeon: Hurman Horn, MD;  Location: [facility];  Service: Ophthalmology;  Laterality: Left;   TONSILLECTOMY     TRANSTHORACIC ECHOCARDIOGRAM  2013   Normal EF. No significant Valve Disease   UPPER GASTROINTESTINAL ENDOSCOPY     WEIL OSTEOTOMY Right 09/01/2017   Procedure: RIGHT GREAT TOE CHEVRON AND WEIL OSTEOTOMY 2ND METATARSAL;  Surgeon: Newt Minion, MD;  Location: [facility];  Service: Orthopedics;   Laterality: Right;    FAMILY HISTORY Family History  Problem Relation Age of Onset   Heart disease Mother    Cancer Paternal Grandfather    Cancer Sister     SOCIAL HISTORY Social History   Tobacco Use   Smoking status: Former Smoker    Types: Cigars    Quit date: 07/27/1988    Years since quitting: 32.4   Smokeless tobacco: Never Used  Vaping Use   Vaping Use: Never used  Substance Use Topics  Alcohol use: Yes    Alcohol/week: 3.0 standard drinks    Types: 1 Glasses of wine, 1 Cans of beer, 1 Shots of liquor per week    Comment: occasional   Drug use: No         OPHTHALMIC EXAM:  Base Eye Exam     Visual Acuity (ETDRS)       Right Left   Dist [place] CF at 3' 20/20    Correction: Glasses         Tonometry (Tonopen, 10:46 AM)       Right Left   Pressure 13 17         Pupils       Dark Light Shape React APD   Right 6 6 Irregular Minimal +1   Left 4 3 Round Slow None         Visual Fields       Left Right    Full    Restrictions  Partial outer superior nasal deficiency         Neuro/Psych     Oriented x3: Yes   Mood/Affect: Normal         Dilation     Both eyes: 1.0% Mydriacyl, 2.5% Phenylephrine @ 10:46 AM           Slit Lamp and Fundus Exam     External Exam       Right Left   External Normal Normal         Slit Lamp Exam       Right Left   Lids/Lashes Normal Normal   Conjunctiva/Sclera White and quiet White and quiet   Cornea Slight thickening, no corneal touch with tube, mostly clear Clear   Anterior Chamber Tube, Tube with  NO corneal touch, mild corneal thickening, mild corneal edema no change not visual acuity limiting Deep and quiet   Iris Round and reactive Round and reactive   Lens Anterior chamber intraocular lens Posterior chamber intraocular lens   Anterior Vitreous Normal Normal         Fundus Exam       Right Left   Posterior Vitreous Vitrectomized, clear Vitrectomized   Disc 2+ Pallor  temporal 1+ Pallor temporal   C/D Ratio 0.75 0.55   Macula Diffuse atrophy, no macular thickening Microaneurysms, no macular thickening   Vessels Old, RVO old vasculitis now inactive Old macular BRVO, inactive   Periphery Good PRP Good PRP sector, no retinal holes or tears            IMAGING AND PROCEDURES  Imaging and Procedures for 12/16/20           ASSESSMENT/PLAN:  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 Secondary corneal edema of right eye  H18.231   3. Neovascular glaucoma, right eye, stage unspecified  H40.51X0     1.  We will continue to observe OU.  OS stabilize vascular disease of the retina post treatment of underlying hepatitis C and cure.  2.  Macular perfusion continue slow improvement from Hemi central retinal vein occlusion left eye.  Will observe  3.  Old neovascular, OD from prior BRVO also stabilized.  Macular nonperfusion accounts for acuity  Ophthalmic Meds Ordered this visit:  No orders of the defined types were placed in this encounter.      Return in about 6 months (around 06/18/2021) for DILATE OU, COLOR FP, OCT.  There are no Patient Instructions on file for this visit.   Explained the diagnoses, plan, and follow up with the patient and they expressed understanding.  Patient expressed understanding of the importance of proper follow up care.   Clent Demark [place] Silveria M.D. Diseases & Surgery of the Retina and Vitreous Retina &  Diabetic [place] 12/16/20     Abbreviations: M myopia (nearsighted); A astigmatism; H hyperopia (farsighted); P presbyopia; Mrx spectacle prescription;  CTL contact lenses; OD right eye; OS left eye; OU both eyes  XT exotropia; ET esotropia; PEK punctate epithelial keratitis; PEE punctate epithelial erosions; DES dry eye syndrome; MGD meibomian gland dysfunction; ATs artificial tears; PFAT's preservative free artificial tears; [place] nuclear sclerotic cataract; PSC posterior subcapsular cataract; ERM epi-retinal membrane; PVD posterior vitreous detachment; RD retinal detachment; DM diabetes mellitus; DR diabetic retinopathy; NPDR non-proliferative diabetic retinopathy; PDR proliferative diabetic retinopathy; CSME clinically significant macular edema; DME diabetic macular edema; dbh dot blot hemorrhages; CWS cotton wool spot; POAG primary open angle glaucoma; C/D cup-to-disc ratio; HVF humphrey visual field; GVF goldmann visual field; OCT optical coherence tomography; IOP intraocular pressure; BRVO Branch retinal vein occlusion; CRVO central retinal vein occlusion; CRAO central retinal artery occlusion; BRAO branch retinal artery occlusion; RT retinal tear; SB scleral buckle; PPV pars plana vitrectomy; VH Vitreous hemorrhage; PRP panretinal laser photocoagulation; IVK intravitreal kenalog; VMT vitreomacular traction; MH Macular hole;  NVD neovascularization of the disc; NVE neovascularization elsewhere; AREDS age related eye disease study; ARMD age related macular degeneration; POAG primary open angle glaucoma; EBMD epithelial/anterior basement membrane dystrophy; ACIOL anterior chamber intraocular lens; IOL intraocular lens; PCIOL posterior chamber intraocular lens; Phaco/IOL phacoemulsification with intraocular lens placement; [place] photorefractive keratectomy; LASIK laser assisted in situ keratomileusis; HTN hypertension; DM diabetes mellitus; COPD chronic obstructive pulmonary disease

## 2020-12-16 NOTE — Assessment & Plan Note (Signed)
Mild corneal edema does not account for acuity, macular nonperfusion from prior vasculitic induced vein occlusion, overall stable posterior segment OD

## 2020-12-16 NOTE — Assessment & Plan Note (Signed)
Intraocular pressure controlled

## 2020-12-18 DIAGNOSIS — M48061 Spinal stenosis, lumbar region without neurogenic claudication: Secondary | ICD-10-CM | POA: Diagnosis not present

## 2020-12-18 DIAGNOSIS — M545 Low back pain, unspecified: Secondary | ICD-10-CM | POA: Diagnosis not present

## 2020-12-19 ENCOUNTER — Other Ambulatory Visit (HOSPITAL_COMMUNITY): Payer: Self-pay

## 2020-12-19 DIAGNOSIS — M7741 Metatarsalgia, right foot: Secondary | ICD-10-CM | POA: Diagnosis not present

## 2020-12-19 MED ORDER — MELOXICAM 15 MG PO TABS
ORAL_TABLET | ORAL | 0 refills | Status: DC
  Filled 2020-12-19: qty 14, 14d supply, fill #0

## 2020-12-20 ENCOUNTER — Other Ambulatory Visit (HOSPITAL_COMMUNITY): Payer: Self-pay

## 2020-12-20 DIAGNOSIS — M48061 Spinal stenosis, lumbar region without neurogenic claudication: Secondary | ICD-10-CM | POA: Diagnosis not present

## 2020-12-20 DIAGNOSIS — M545 Low back pain, unspecified: Secondary | ICD-10-CM | POA: Diagnosis not present

## 2020-12-21 ENCOUNTER — Other Ambulatory Visit (HOSPITAL_COMMUNITY): Payer: Self-pay

## 2020-12-24 ENCOUNTER — Encounter: Payer: Self-pay | Admitting: Cardiology

## 2020-12-24 ENCOUNTER — Ambulatory Visit: Payer: Medicare Other | Admitting: Cardiology

## 2020-12-24 ENCOUNTER — Other Ambulatory Visit: Payer: Self-pay

## 2020-12-24 ENCOUNTER — Other Ambulatory Visit (HOSPITAL_COMMUNITY): Payer: Self-pay

## 2020-12-24 VITALS — BP 122/84 | HR 66 | Ht 70.5 in | Wt 157.0 lb

## 2020-12-24 DIAGNOSIS — I700 Atherosclerosis of aorta: Secondary | ICD-10-CM | POA: Diagnosis not present

## 2020-12-24 DIAGNOSIS — R200 Anesthesia of skin: Secondary | ICD-10-CM

## 2020-12-24 DIAGNOSIS — M47816 Spondylosis without myelopathy or radiculopathy, lumbar region: Secondary | ICD-10-CM | POA: Diagnosis not present

## 2020-12-24 DIAGNOSIS — E785 Hyperlipidemia, unspecified: Secondary | ICD-10-CM

## 2020-12-24 DIAGNOSIS — I10 Essential (primary) hypertension: Secondary | ICD-10-CM | POA: Diagnosis not present

## 2020-12-24 DIAGNOSIS — Z8249 Family history of ischemic heart disease and other diseases of the circulatory system: Secondary | ICD-10-CM | POA: Diagnosis not present

## 2020-12-24 DIAGNOSIS — H34812 Central retinal vein occlusion, left eye, with macular edema: Secondary | ICD-10-CM | POA: Diagnosis not present

## 2020-12-24 DIAGNOSIS — R079 Chest pain, unspecified: Secondary | ICD-10-CM

## 2020-12-24 MED ORDER — METOPROLOL TARTRATE 50 MG PO TABS
100.0000 mg | ORAL_TABLET | Freq: Once | ORAL | 0 refills | Status: DC
  Filled 2020-12-24: qty 2, 1d supply, fill #0

## 2020-12-24 MED ORDER — ISOSORBIDE MONONITRATE ER 30 MG PO TB24
30.0000 mg | ORAL_TABLET | Freq: Every day | ORAL | 3 refills | Status: DC
  Filled 2020-12-24: qty 90, 90d supply, fill #0

## 2020-12-24 NOTE — Progress Notes (Signed)
Primary Care Provider: Denita Lung, MD Cardiologist: Glenetta Hew, MD Electrophysiologist: None  Clinic Note: Chief Complaint  Patient presents with  . Chest discomfort    ===================================  ASSESSMENT/PLAN   Problem List Items Addressed This Visit    Chest pain at rest    Strange symptoms that he has which very well could still be esophageal spasm, however now with acute symptoms recurring anemia is a fatigue and exertional dyspnea associated with it.  I think we need to exclude CAD.  Would like to know his baseline cardiovascular risk.  Plan: Coronary CT angiogram with possible CT FFR.  This also provides his coronary calcium score.  He is already on verapamil for possible spasm, we will add Imdur 30 mg daily.        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  Will check carotid Dopplers.        Relevant Orders   Basic metabolic panel   VAS US CAROTID   Hyperlipidemia with target LDL less than 100 (Chronic)    Has not done well with simvastatin or rosuvastatin.  For some reason he has a new prescription for simvastatin written.  He has been taking Livalo 1 tablet twice a week.  I would like for her to try taking 1 tablet 3 days a week until current bottle is complete.  LDL not at goal so he would not be at higher dosing.  We will see how he does with switching back to simvastatin, I suspect that we probably need to go back to Livalo and will have to work on prior authorization based on statin intolerance.  Otherwise will refer to [facility] ([facility]) for other considerations.       Relevant Medications   isosorbide mononitrate (IMDUR) 30 MG 24 hr tablet   metoprolol tartrate (LOPRESSOR) 50 MG tablet   Other Relevant Orders   EKG 12-Lead (Completed)   Basic metabolic panel   Abdominal aortic atherosclerosis (HCC) (Chronic)    Should be due for reevaluation in the next year or so.  Continue risk factor modification.      Relevant Medications   isosorbide mononitrate (IMDUR) 30 MG 24 hr tablet   metoprolol tartrate (LOPRESSOR) 50 MG tablet   Other Relevant Orders   EKG 12-Lead (Completed)   Basic metabolic panel   Moderate essential hypertension - Primary (Chronic)    Well-controlled blood pressure on irbesartan and verapamil.  Using verapamil for treatment of possible spasm.  Splitting of the dosing 1 in the night 1 in the morning.  Should tolerate taking Imdur.  We will start 30 mg daily.      Relevant Medications   isosorbide mononitrate (IMDUR) 30 MG 24 hr tablet   metoprolol tartrate (LOPRESSOR) 50 MG tablet   Other Relevant Orders   EKG 12-Lead (Completed)   Basic metabolic panel   Family history of heart disease in male family member before age 73 (Chronic)    He has had nonischemic evaluations in the past.  Continue treating risk factors.  We are ordering a coronary CT angiogram with coronary calcium score partially for restratification but  also to exclude ischemic disease CAD based on his recurrent chest pain symptoms.  This will allow [place] to determine how aggressive we need to be for treating him.        ===================================  HPI:    [name] is a 70 y.o. male with a PMH notable for Parkinson's disease and cardiac risk factors including Hypertension, Hyperlipidemia and Family History of Premature CAD who presents today for work in visit for chest pain.[name] was last seen on 03/11/2020: He was overall doing relatively well.  Stable.  Continue to have episodic spells of chest tightness and squeezing sensation  maybe twice a week in the lower chest epigastric area.  Often associated with lying down, or after eating.  They can last about 2 minutes or so and usually resolve.  Not necessarily associate with exertion but, but can potentially happen with exertion.  He does not necessarily think that they exacerbated by exertion.  He just describes it as a tightness and a fluttering.  Oftentimes alleviated with warm water but also can be alleviated by nitroglycerin. => Symptoms were thought to be related to esophageal spasm.  Was placed on verapamil. -> Had had some issues with statins and was started on Livalo taking it 2 times a week.  Recent Hospitalizations: None  Reviewed  CV studies:    The following studies were reviewed today: (if available, images/films reviewed: From Epic Chart or Care Everywhere) . n/a:  Interval History:   [name] presents here today for walk-in evaluation for chest discomfort.  He said he been doing really well up until the last few weeks and he is now having more epis frequent episodes of chest discomfort.  Episodes of chest -upper L shoulder tightness with fluttering -- more frequent.  Always @ rest.  Not made worse with activity.  Last ~ 5 min.  Alleviated with drinking warm water & occasionally with NTG. Feels like the esophagus is "fluttering" - not like skipped beats.  May occur after eating & can occur while lying down or reclining.  May go a few days without - then may have a couple in the same day.  Not really dizzy - but will want to sit down.    Due to back pain - not exerting himself very much.   L side facial tingling & L body - not necessarily associated with fluttering.   No irrregular heart beats / skipping beats.  No tachycardia.   CV Review of Symptoms (Summary) Cardiovascular ROS: positive for - chest pain and Facial tingling on the left side, also does not really describe pain is much as a fluttering sensation.  He does not describe palpitations or  skipped beats. negative for - dyspnea on exertion, edema, irregular heartbeat, orthopnea, paroxysmal nocturnal dyspnea, rapid heart rate, shortness of breath or Syncope or near syncope, TIA or amaurosis fugax symptoms.  Just some weird tingling on the left side of his face and left arm.   REVIEWED OF SYSTEMS   Review of Systems  Constitutional: Positive for malaise/fatigue (Notes less energy since the onset of the symptoms.). Negative for weight loss.  HENT: Negative for congestion and nosebleeds.   Respiratory: Negative for shortness of breath.   Gastrointestinal: Positive for abdominal pain (Epigastric/lower chest distribution for the "fluttering sensation).  Musculoskeletal: Negative for joint pain.  Neurological: Positive for tremors (Parkinson's).  Psychiatric/Behavioral: Negative for hallucinations and memory loss. The patient is nervous/anxious (A little anxious about the symptoms.). The  patient does not have insomnia.    I have reviewed and (if needed) personally updated the patient's problem list, medications, allergies, past medical and surgical history, social and family history.   PAST MEDICAL HISTORY   Past Medical History:  Diagnosis Date  . Allergy   . Arthritis   . BPH (benign prostatic hyperplasia)   . Complication of anesthesia    Pt. stated he had a reaction that ended in him requiring urinary cath placement  . Diverticulosis   . GERD (gastroesophageal reflux disease)    esophageal spasms  . Glaucoma   . Gout   . Head injury, closed, with concussion   . Hepatitis C    chronic - Has been treated with Harvoni  . HLD (hyperlipidemia)    statin intolerant (Crestor & Simvastatin) - Taking Livalo 1mg  / week  . Hypertension   . Parkinson's disease ([place])   . Plantar fasciitis    right  . PVD (peripheral vascular disease) ([place])    With no claudication; only mild abdominal aortic atherosclerosis noted on ultrasound.  . Thoracic ascending aortic aneurysm (HCC)     4.2 cm ascending TAA 09/2016 CT, 1 yr f/u rec  . Ulcer     PAST SURGICAL HISTORY   Past Surgical History:  Procedure Laterality Date  . BUNIONECTOMY WITH WEIL OSTEOTOMY Right 11/02/2019   Procedure: Right Foot Lapidus, Modified McBride Bunionectomy,  Hallux Akin Osteotomy;  Surgeon: Wylene Simmer, MD;  Location: [place];  Service: Orthopedics;  Laterality: Right;  . CARDIAC CATHETERIZATION  2005   30% Cx. Dr. Melvern Banker  . cataract surgery Left 11/05/2014  . COLONOSCOPY    . ESOPHAGOGASTRODUODENOSCOPY N/A 05/02/2015   Procedure: ESOPHAGOGASTRODUODENOSCOPY (EGD);  Surgeon: Ronald Lobo, MD;  Location: [facility];  Service: Endoscopy;  Laterality: N/A;  . ESOPHAGOGASTRODUODENOSCOPY  05/02/2015   no source of pt chest pain endoscopically evident. small hiatal hernia.  [name] EYE SURGERY    . HARDWARE REMOVAL Right 11/02/2019   Procedure: Second Metatarsal Removal of Deep Implant and Rotational Osteotomy;  Surgeon: Wylene Simmer, MD;  Location: [facility];  Service: Orthopedics;  Laterality: Right;  . MEMBRANE PEEL Left 03/14/2014   Procedure: MEMBRANE PEEL; ENDOLASER;  Surgeon: Hurman Horn, MD;  Location: [facility];  Service: Ophthalmology;  Laterality: Left;  . NM MYOVIEW LTD  2011   Neg Ischemia or infarct.  [name] NM MYOVIEW LTD  10/2015   LOW RISK. Small, fixed basal lateral defect - likely diaphragmatic attenuation. EF 69%  . PARS PLANA VITRECTOMY Left 03/14/2014   Procedure: PARS PLANA VITRECTOMY WITH 25 GAUGE;  Surgeon: Hurman Horn, MD;  Location: [place];  Service: Ophthalmology;  Laterality: Left;  . TONSILLECTOMY    . TRANSTHORACIC ECHOCARDIOGRAM  2013   Normal EF. No significant Valve Disease  . UPPER GASTROINTESTINAL ENDOSCOPY    . WEIL OSTEOTOMY Right 09/01/2017   Procedure: RIGHT GREAT TOE CHEVRON AND WEIL OSTEOTOMY 2ND METATARSAL;  Surgeon: Newt Minion, MD;  Location: [facility];  Service: Orthopedics;  Laterality: Right;    Immunization History  Administered  Date(s) Administered  . DTaP 09/10/1997, 08/11/2007  . Fluad Quad(high Dose 65+) 05/17/2019  . Influenza Split 05/09/2012, 06/09/2015, 07/11/2015  . Influenza Whole 07/10/2004, 05/25/2006  . Influenza, High Dose Seasonal PF 05/05/2017, 04/15/2018  . Influenza,inj,Quad PF,6+ Mos 04/28/2013, 04/26/2014, 05/31/2016  . Influenza-Unspecified 05/25/2016  . Moderna SARS-COV2 Booster Vaccination 11/07/2020  . Moderna Sars-Covid-2 Vaccination 08/20/2019, 09/17/2019, 05/22/2020  . Pneumococcal Conjugate-13 09/07/2010  .  Pneumococcal Polysaccharide-23 07/16/2017  . Tdap 09/05/2011  . Zoster Recombinat (Shingrix) 01/17/2020, 03/20/2020    MEDICATIONS/ALLERGIES   Current Meds  Medication Sig  . Acetaminophen (ACETAMIN PO) Take 500 mg by mouth every 4 (four) hours as needed (pain).   [name] alfuzosin (UROXATRAL) 10 MG 24 hr tablet TAKE 1 TABLET BY MOUTH ONCE A DAY  . calcium-vitamin D (OSCAL WITH D) 250-125 MG-UNIT tablet Take 1 tablet by mouth daily.  . carbidopa-levodopa (SINEMET IR) 25-100 MG tablet TAKE 1 AND 1/2 TABLETS BY MOUTH THREE TIMES DAILY  . citalopram (CELEXA) 40 MG tablet TAKE 1 TABLET BY MOUTH ONCE A DAY  . irbesartan (AVAPRO) 300 MG tablet TAKE 1 TABLET BY MOUTH EVERY MORNING  . isosorbide mononitrate (IMDUR) 30 MG 24 hr tablet Take 1 tablet (30 mg total) by mouth daily.  (Take 1/2 hour after taking tylenol)  . L-Methylfolate-B12-B6-B2 (CEREFOLIN) 12-25-48-5 MG TABS Take 1 tablet by mouth daily.  . meloxicam (MOBIC) 15 MG tablet Take 1 tablet by mouth with a meal once daily for 14 days.  . metoprolol tartrate (LOPRESSOR) 50 MG tablet Take 2 tablets (100 mg total) by mouth once for 1 dose. TAKE TWO HOURS PRIOR TO  SCHEDULE CARDIAC TEST  . nitroGLYCERIN (NITROSTAT) 0.4 MG SL tablet Place 1 tablet (0.4 mg total) under the tongue every 5 (five) minutes as needed for chest pain.  . Omega 3 340 MG CPDR 2 capsules  . pantoprazole (PROTONIX) 40 MG tablet Take 40 mg by mouth 2 (two) times daily.   [name] senna (SENOKOT) 8.6 MG TABS tablet Take 2 tablets (17.2 mg total) by mouth 2 (two) times daily.  . simvastatin (ZOCOR) 40 MG tablet TAKE 1 TABLET BY MOUTH AT BEDTIME  . timolol (TIMOPTIC) 0.5 % ophthalmic solution Place 1 drop into the left eye daily.   . trihexyphenidyl (ARTANE) 2 MG tablet TAKE 1/2 TABLET BY MOUTH 2 TIMES DAILY WITH A MEAL  . verapamil (CALAN-SR) 240 MG CR tablet TAKE 1 TABLET BY MOUTH AT BEDTIME.    No Known Allergies  SOCIAL HISTORY/FAMILY HISTORY   Reviewed in Epic:  Pertinent findings:  Social History   Tobacco Use  . Smoking status: Former Smoker    Types: Cigars    Quit date: 07/27/1988    Years since quitting: 32.4  . Smokeless tobacco: Never Used  Vaping Use  . Vaping Use: Never used  Substance Use Topics  . Alcohol use: Yes    Alcohol/week: 3.0 standard drinks    Types: 1 Glasses of wine, 1 Cans of beer, 1 Shots of liquor per week    Comment: occasional  . Drug use: No   Social History   Social History Narrative   Lives with wife    Right handed   Drinks 1-2 cups caffeine daily    OBJCTIVE -PE, EKG, labs   Wt Readings from Last 3 Encounters:  12/24/20 157 lb (71.2 kg)  11/12/20 159 lb 3.2 oz (72.2 kg)  10/01/20 157 lb 3.2 oz (71.3 kg)    Physical Exam: BP 122/84   Pulse 66   Ht 5' 10.5" (1.791 m)   Wt 157 lb (71.2 kg)   SpO2 99%   BMI 22.21 kg/m  Physical Exam Vitals reviewed.  Constitutional:      General: He is not in acute distress.    Appearance: Normal appearance. He is normal weight. He is not ill-appearing or toxic-appearing.  HENT:     Head: Normocephalic and atraumatic.  Neck:  Vascular: No hepatojugular reflux or JVD.  Cardiovascular:     Rate and Rhythm: Normal rate and regular rhythm.  No extrasystoles are present.    Chest Wall: PMI is not displaced.     Pulses: Normal pulses.     Heart sounds: Murmur (1-2/6 SEM at RUSB.) heard.  No friction rub. No gallop.   Pulmonary:     Effort: Pulmonary effort is  normal. No respiratory distress.     Breath sounds: No wheezing, rhonchi or rales.  Chest:     Chest wall: No tenderness.  Musculoskeletal:        General: No swelling (Trivial 1+ bilateral LE). Normal range of motion.     Cervical back: Normal range of motion and neck supple.  Skin:    General: Skin is warm and dry.  Neurological:     General: No focal deficit present.     Mental Status: He is alert and oriented to person, place, and time.     Gait: Gait abnormal (Somewhat stiff, shuffling gait/rigid from Parkinson's).     Comments: Mild pill-rolling tremor in the left arm  Psychiatric:        Mood and Affect: Mood normal.        Behavior: Behavior normal.        Thought Content: Thought content normal.        Judgment: Judgment normal.     Adult ECG Report  Rate: 66 ;  Rhythm: normal sinus rhythm and Borderline criteria for anteroseptal MI, age-indeterminate.;   Narrative Interpretation: Stable  Recent Labs: Reviewed Lab Results  Component Value Date   CHOL 175 09/10/2020   HDL 63 09/10/2020   LDLCALC 104 (H) 09/10/2020   TRIG 38 09/10/2020   CHOLHDL 2.8 09/10/2020   Lab Results  Component Value Date   CREATININE 1.24 09/10/2020   BUN 22 09/10/2020   NA 137 09/10/2020   K 4.3 09/10/2020   CL 101 09/10/2020   CO2 22 09/10/2020   CBC Latest Ref Rng & Units 09/10/2020 11/29/2019 04/19/2018  WBC 3.4 - 10.8 x10E3/uL 6.1 5.2 9.1  Hemoglobin 13.0 - 17.7 g/dL 14.1 14.3 14.0  Hematocrit 37.5 - 51.0 % 41.2 42.2 41.2  Platelets 150 - 450 x10E3/uL 243 297 258    Lab Results  Component Value Date   TSH 1.950 09/28/2016    ==================================================  COVID-19 Education: The signs and symptoms of COVID-19 were discussed with the patient and how to seek care for testing (follow up with PCP or arrange E-visit).    I spent a total of 45 minutes with the patient spent in direct patient consultation.  Additional time spent with chart review  / charting  (studies, outside notes, etc): 16 min Total Time: 61 min   Current medicines are reviewed at length with the patient today.  (+/- concerns) none  This visit occurred during the SARS-CoV-2 public health emergency.  Safety protocols were in place, including screening questions prior to the visit, additional usage of staff PPE, and extensive cleaning of exam room while observing appropriate contact time as indicated for disinfecting solutions.  Notice: This dictation was prepared with Dragon dictation along with smaller phrase technology. Any transcriptional errors that result from this process are unintentional and may not be corrected upon review.  Patient Instructions / Medication Changes & Studies & Tests Ordered   Patient Instructions  .Medication Instructions:    start taking Imdur _ isosorbide mono. 30 mg - take 30 min after taking your tylenol  Take Metoprolol 100 mg - only the day of CCTA - test *If you need a refill on your cardiac medications before your next appointment, please call your pharmacy*   Lab Work: See instruction sheet  If you have labs (blood work) drawn today and your tests are completely normal, you will receive your results only by: [name] MyChart Message (if you have MyChart) OR . A paper copy in the mail If you have any lab test that is abnormal or we need to change your treatment, we will call you to review the results.   Testing/Procedures: Will be schedule at [facility] has requested that you have cardiac CTA. Cardiac computed tomography (CT) is a painless test that uses an x-ray machine to take clear, detailed pictures of your heart. Please follow instruction sheet as given.   Will be schedule at [address] Your physician has requested that you have a carotid duplex. This test is an ultrasound of the carotid arteries in your neck. It looks at blood flow through these arteries that supply the brain with  blood. Allow one hour for this exam. There are no restrictions or special instructions.    Follow-Up: At [facility], you and your health needs are our priority.  As part of our continuing mission to provide you with exceptional heart care, we have created designated Provider Care Teams.  These Care Teams include your primary Cardiologist (physician) and Advanced Practice Providers (APPs -  Physician Assistants and Nurse Practitioners) who all work together to provide you with the care you need, when you need it.     Your next appointment:   2 month(s)  The format for your next appointment:   In Person  Provider:   Glenetta Hew, MD   Other Instructions  Your cardiac CT will be scheduled at  the below location:   [facility] [address] [place], [place] [address] [phone]   Please arrive at the [facility] main entrance (entrance A) of [facility] 30 minutes prior to test start time. Proceed to the [facility] Radiology Department (first floor) to check-in and test prep.    Please follow these instructions carefully (unless otherwise directed):  Hold all erectile dysfunction medications at least 3 days (72 hrs) prior to test.  You will need to have lab BMP at least one week prior to test .     On the Night Before the Test: . Be sure to Drink plenty of water. . Do not consume any caffeinated/decaffeinated beverages or chocolate 12 hours prior to your test. . Do not take any antihistamines 12 hours prior to your test.   On the Day of the Test: . Drink plenty of water until 1 hour prior to the test. . Do not eat any food 4 hours prior to the test. . You may take your regular medications prior to the test.  . Take metoprolol (Lopressor)  100 mg two hours prior to test. Do not take your Avapro this day. [name] HOLD Furosemide/Hydrochlorothiazide morning of the test.          After the Test: . Drink plenty of water. . After receiving IV  contrast, you may experience a mild flushed feeling. This is normal. . On occasion, you may experience a mild rash up to 24 hours after the test. This is not dangerous. If this occurs, you can take Benadryl 25 mg and increase your fluid intake. . If  you experience trouble breathing, this can be serious. If it is severe call 911 IMMEDIATELY. If it is mild, please call our office. . If you take any of these medications: Glipizide/Metformin, Avandament, Glucavance, please do not take 48 hours after completing test unless otherwise instructed.   Once we have confirmed authorization from your insurance company, we will call you to set up a date and time for your test. Based on how quickly your insurance processes prior authorizations requests, please allow up to 4 weeks to be contacted for scheduling your Cardiac CT appointment. Be advised that routine Cardiac CT appointments could be scheduled as many as 8 weeks after your provider has ordered it.  For non-scheduling related questions, please contact the cardiac imaging nurse navigator should you have any questions/concerns: Marchia Bond, Cardiac Imaging Nurse Navigator Gordy Clement, Cardiac Imaging Nurse Navigator [facility] Direct Office Dial: [phone]   For scheduling needs, including cancellations and rescheduling, please call [place], [phone].      Studies Ordered:   Orders Placed This Encounter  Procedures  . Basic metabolic panel  . EKG 12-Lead  . VAS US CAROTID     Glenetta Hew, M.D., M.S. Interventional Cardiologist   Pager # [phone] Phone # [phone] [address] [place], [place] [address]   Thank you for choosing Heartcare at [facility]!!

## 2020-12-24 NOTE — Patient Instructions (Addendum)
.Medication Instructions:    start taking Imdur _ isosorbide mono. 30 mg - take 30 min after taking your tylenol   Take Metoprolol 100 mg - only the day of CCTA - test *If you need a refill on your cardiac medications before your next appointment, please call your pharmacy*   Lab Work: See instruction sheet  If you have labs (blood work) drawn today and your tests are completely normal, you will receive your results only by: [name] MyChart Message (if you have MyChart) OR . A paper copy in the mail If you have any lab test that is abnormal or we need to change your treatment, we will call you to review the results.   Testing/Procedures: Will be schedule at [facility] has requested that you have cardiac CTA. Cardiac computed tomography (CT) is a painless test that uses an x-ray machine to take clear, detailed pictures of your heart. Please follow instruction sheet as given.   Will be schedule at [address] Your physician has requested that you have a carotid duplex. This test is an ultrasound of the carotid arteries in your neck. It looks at blood flow through these arteries that supply the brain with blood. Allow one hour for this exam. There are no restrictions or special instructions.    Follow-Up: At [facility], you and your health needs are our priority.  As part of our continuing mission to provide you with exceptional heart care, we have created designated Provider Care Teams.  These Care Teams include your primary Cardiologist (physician) and Advanced Practice Providers (APPs -  Physician Assistants and Nurse Practitioners) who all work together to provide you with the care you need, when you need it.     Your next appointment:   2 month(s)  The format for your next appointment:   In Person  Provider:   Glenetta Hew, MD   Other Instructions  Your cardiac CT will be scheduled at  the below location:   [facility] [address] [place], [place] [address] [phone]   Please arrive at the [facility] main entrance (entrance A) of [facility] 30 minutes prior to test start time. Proceed to the [facility] Radiology Department (first floor) to check-in and test prep.    Please follow these instructions carefully (unless otherwise directed):  Hold all erectile dysfunction medications at least 3 days (72 hrs) prior to test.  You will need to have lab BMP at least one week prior to test .     On the Night Before the Test: . Be sure to Drink plenty of water. . Do not consume any caffeinated/decaffeinated beverages or chocolate 12 hours prior to your test. . Do not take any antihistamines 12 hours prior to your test.   On the Day of the Test: . Drink plenty of water until 1 hour prior to the test. . Do not eat any food 4 hours prior to the test. . You may take your regular medications prior to the test.  . Take metoprolol (Lopressor)  100 mg two hours prior to test. Do not take your Avapro this day. [name] HOLD Furosemide/Hydrochlorothiazide morning of the test.          After the Test: . Drink plenty of water. . After receiving IV contrast, you may experience a mild flushed feeling. This is normal. . On occasion, you may experience a mild rash up to 24 hours after  the test. This is not dangerous. If this occurs, you can take Benadryl 25 mg and increase your fluid intake. . If you experience trouble breathing, this can be serious. If it is severe call 911 IMMEDIATELY. If it is mild, please call our office. . If you take any of these medications: Glipizide/Metformin, Avandament, Glucavance, please do not take 48 hours after completing test unless otherwise instructed.   Once we have confirmed authorization from your insurance company, we will call you to set up a date and time for your test. Based on how quickly your insurance processes prior authorizations requests,  please allow up to 4 weeks to be contacted for scheduling your Cardiac CT appointment. Be advised that routine Cardiac CT appointments could be scheduled as many as 8 weeks after your provider has ordered it.  For non-scheduling related questions, please contact the cardiac imaging nurse navigator should you have any questions/concerns: Marchia Bond, Cardiac Imaging Nurse Navigator Gordy Clement, Cardiac Imaging Nurse Navigator [facility] Direct Office Dial: [phone]   For scheduling needs, including cancellations and rescheduling, please call [place], [phone].

## 2020-12-25 ENCOUNTER — Encounter: Payer: Self-pay | Admitting: Cardiology

## 2020-12-25 NOTE — Assessment & Plan Note (Signed)
Should be due for reevaluation in the next year or so.  Continue risk factor modification.

## 2020-12-25 NOTE — Assessment & Plan Note (Signed)
Well-controlled blood pressure on irbesartan and verapamil.  Using verapamil for treatment of possible spasm.  Splitting of the dosing 1 in the night 1 in the morning.  Should tolerate taking Imdur.  We will start 30 mg daily.

## 2020-12-25 NOTE — Assessment & Plan Note (Signed)
Left-sided facial and arm numbness which could be an anginal equivalent, or could be related to cerebrovascular ischemia.  Will check carotid Dopplers.

## 2020-12-25 NOTE — Assessment & Plan Note (Addendum)
Has not done well with simvastatin or rosuvastatin.  For some reason he has a new prescription for simvastatin written.  He has been taking Livalo 1 tablet twice a week.  I would like for her to try taking 1 tablet 3 days a week until current bottle is complete.  LDL not at goal so he would not be at higher dosing.  We will see how he does with switching back to simvastatin, I suspect that we probably need to go back to Livalo and will have to work on prior authorization based on statin intolerance.  Otherwise will refer to [facility] ([facility]) for other considerations.

## 2020-12-25 NOTE — Assessment & Plan Note (Signed)
He has had nonischemic evaluations in the past.  Continue treating risk factors.  We are ordering a coronary CT angiogram with coronary calcium score partially for restratification but also to exclude ischemic disease CAD based on his recurrent chest pain symptoms.  This will allow [place] to determine how aggressive we need to be for treating him.

## 2020-12-25 NOTE — Assessment & Plan Note (Signed)
Strange symptoms that he has which very well could still be esophageal spasm, however now with acute symptoms recurring anemia is a fatigue and exertional dyspnea associated with it.  I think we need to exclude CAD.  Would like to know his baseline cardiovascular risk.  Plan: Coronary CT angiogram with possible CT FFR.  This also provides his coronary calcium score.  He is already on verapamil for possible spasm, we will add Imdur 30 mg daily.

## 2020-12-26 ENCOUNTER — Telehealth: Payer: Self-pay | Admitting: Cardiology

## 2020-12-26 ENCOUNTER — Other Ambulatory Visit (HOSPITAL_COMMUNITY): Payer: Self-pay

## 2020-12-26 DIAGNOSIS — M7741 Metatarsalgia, right foot: Secondary | ICD-10-CM | POA: Diagnosis not present

## 2020-12-26 MED ORDER — PREDNISONE 10 MG PO TABS
ORAL_TABLET | ORAL | 0 refills | Status: DC
  Filled 2020-12-26: qty 14, 6d supply, fill #0

## 2020-12-26 NOTE — Telephone Encounter (Signed)
Left message for pt to call.

## 2020-12-26 NOTE — Telephone Encounter (Signed)
Pt c/o medication issue: 1. Name of Medication: Isosorbide  2. How are you currently taking this medication (dosage and times per day)? 1 tap a day 30mg  3. Are you having a reaction (difficulty breathing--STAT)?  No  4. What is your medication issue? Bad headache

## 2020-12-29 MED FILL — Trihexyphenidyl HCl Tab 2 MG: ORAL | 30 days supply | Qty: 30 | Fill #2 | Status: AC

## 2020-12-29 MED FILL — Irbesartan Tab 300 MG: ORAL | 30 days supply | Qty: 30 | Fill #2 | Status: CN

## 2020-12-29 MED FILL — Citalopram Hydrobromide Tab 40 MG (Base Equiv): ORAL | 30 days supply | Qty: 30 | Fill #1 | Status: AC

## 2020-12-30 ENCOUNTER — Other Ambulatory Visit (HOSPITAL_COMMUNITY): Payer: Self-pay

## 2020-12-30 DIAGNOSIS — M48061 Spinal stenosis, lumbar region without neurogenic claudication: Secondary | ICD-10-CM | POA: Diagnosis not present

## 2020-12-30 DIAGNOSIS — M545 Low back pain, unspecified: Secondary | ICD-10-CM | POA: Diagnosis not present

## 2020-12-31 NOTE — Telephone Encounter (Signed)
Returned call to patient's wife left message on personal voice mail to call back. 

## 2021-01-01 ENCOUNTER — Other Ambulatory Visit (HOSPITAL_COMMUNITY): Payer: Self-pay

## 2021-01-01 ENCOUNTER — Ambulatory Visit (HOSPITAL_BASED_OUTPATIENT_CLINIC_OR_DEPARTMENT_OTHER)
Admission: RE | Admit: 2021-01-01 | Discharge: 2021-01-01 | Disposition: A | Discharge status: Home / Self Care | Payer: Medicare Other | Source: Ambulatory Visit | Attending: Cardiology | Admitting: Cardiology

## 2021-01-01 ENCOUNTER — Other Ambulatory Visit: Payer: Self-pay

## 2021-01-01 DIAGNOSIS — K922 Gastrointestinal hemorrhage, unspecified: Secondary | ICD-10-CM | POA: Diagnosis not present

## 2021-01-01 DIAGNOSIS — J9811 Atelectasis: Secondary | ICD-10-CM | POA: Diagnosis not present

## 2021-01-01 DIAGNOSIS — Z8719 Personal history of other diseases of the digestive system: Secondary | ICD-10-CM | POA: Diagnosis not present

## 2021-01-01 DIAGNOSIS — H538 Other visual disturbances: Secondary | ICD-10-CM | POA: Diagnosis not present

## 2021-01-01 DIAGNOSIS — I9589 Other hypotension: Secondary | ICD-10-CM | POA: Diagnosis not present

## 2021-01-01 DIAGNOSIS — I248 Other forms of acute ischemic heart disease: Secondary | ICD-10-CM | POA: Diagnosis not present

## 2021-01-01 DIAGNOSIS — M7989 Other specified soft tissue disorders: Secondary | ICD-10-CM | POA: Diagnosis not present

## 2021-01-01 DIAGNOSIS — D62 Acute posthemorrhagic anemia: Secondary | ICD-10-CM | POA: Diagnosis not present

## 2021-01-01 DIAGNOSIS — K2950 Unspecified chronic gastritis without bleeding: Secondary | ICD-10-CM | POA: Diagnosis not present

## 2021-01-01 DIAGNOSIS — R42 Dizziness and giddiness: Secondary | ICD-10-CM | POA: Diagnosis not present

## 2021-01-01 DIAGNOSIS — K449 Diaphragmatic hernia without obstruction or gangrene: Secondary | ICD-10-CM | POA: Diagnosis not present

## 2021-01-01 DIAGNOSIS — N400 Enlarged prostate without lower urinary tract symptoms: Secondary | ICD-10-CM | POA: Diagnosis present

## 2021-01-01 DIAGNOSIS — R5381 Other malaise: Secondary | ICD-10-CM | POA: Diagnosis not present

## 2021-01-01 DIAGNOSIS — I951 Orthostatic hypotension: Secondary | ICD-10-CM | POA: Diagnosis not present

## 2021-01-01 DIAGNOSIS — R911 Solitary pulmonary nodule: Secondary | ICD-10-CM | POA: Diagnosis not present

## 2021-01-01 DIAGNOSIS — R200 Anesthesia of skin: Secondary | ICD-10-CM | POA: Insufficient documentation

## 2021-01-01 DIAGNOSIS — K219 Gastro-esophageal reflux disease without esophagitis: Secondary | ICD-10-CM | POA: Diagnosis not present

## 2021-01-01 DIAGNOSIS — K921 Melena: Secondary | ICD-10-CM | POA: Diagnosis not present

## 2021-01-01 DIAGNOSIS — R6889 Other general symptoms and signs: Secondary | ICD-10-CM | POA: Diagnosis not present

## 2021-01-01 DIAGNOSIS — F05 Delirium due to known physiological condition: Secondary | ICD-10-CM | POA: Diagnosis not present

## 2021-01-01 DIAGNOSIS — R9431 Abnormal electrocardiogram [ECG] [EKG]: Secondary | ICD-10-CM | POA: Diagnosis not present

## 2021-01-01 DIAGNOSIS — E861 Hypovolemia: Secondary | ICD-10-CM | POA: Diagnosis not present

## 2021-01-01 DIAGNOSIS — Z9889 Other specified postprocedural states: Secondary | ICD-10-CM | POA: Diagnosis not present

## 2021-01-01 DIAGNOSIS — R110 Nausea: Secondary | ICD-10-CM | POA: Diagnosis not present

## 2021-01-01 DIAGNOSIS — G20 Parkinson's disease: Secondary | ICD-10-CM | POA: Diagnosis not present

## 2021-01-01 DIAGNOSIS — K648 Other hemorrhoids: Secondary | ICD-10-CM | POA: Diagnosis not present

## 2021-01-01 DIAGNOSIS — R404 Transient alteration of awareness: Secondary | ICD-10-CM | POA: Diagnosis not present

## 2021-01-01 DIAGNOSIS — R55 Syncope and collapse: Secondary | ICD-10-CM | POA: Diagnosis not present

## 2021-01-01 DIAGNOSIS — I739 Peripheral vascular disease, unspecified: Secondary | ICD-10-CM | POA: Diagnosis not present

## 2021-01-01 DIAGNOSIS — R58 Hemorrhage, not elsewhere classified: Secondary | ICD-10-CM | POA: Diagnosis not present

## 2021-01-01 DIAGNOSIS — Z20822 Contact with and (suspected) exposure to covid-19: Secondary | ICD-10-CM | POA: Diagnosis not present

## 2021-01-01 DIAGNOSIS — K625 Hemorrhage of anus and rectum: Secondary | ICD-10-CM | POA: Diagnosis not present

## 2021-01-01 DIAGNOSIS — K5900 Constipation, unspecified: Secondary | ICD-10-CM | POA: Diagnosis not present

## 2021-01-01 DIAGNOSIS — R778 Other specified abnormalities of plasma proteins: Secondary | ICD-10-CM | POA: Diagnosis not present

## 2021-01-01 DIAGNOSIS — I201 Angina pectoris with documented spasm: Secondary | ICD-10-CM | POA: Diagnosis not present

## 2021-01-01 DIAGNOSIS — R4182 Altered mental status, unspecified: Secondary | ICD-10-CM | POA: Diagnosis not present

## 2021-01-01 DIAGNOSIS — Z781 Physical restraint status: Secondary | ICD-10-CM | POA: Diagnosis not present

## 2021-01-01 DIAGNOSIS — K5710 Diverticulosis of small intestine without perforation or abscess without bleeding: Secondary | ICD-10-CM | POA: Diagnosis not present

## 2021-01-01 DIAGNOSIS — K5731 Diverticulosis of large intestine without perforation or abscess with bleeding: Secondary | ICD-10-CM | POA: Diagnosis not present

## 2021-01-01 DIAGNOSIS — E785 Hyperlipidemia, unspecified: Secondary | ICD-10-CM | POA: Diagnosis not present

## 2021-01-01 DIAGNOSIS — G9340 Encephalopathy, unspecified: Secondary | ICD-10-CM | POA: Diagnosis not present

## 2021-01-01 DIAGNOSIS — I10 Essential (primary) hypertension: Secondary | ICD-10-CM | POA: Diagnosis not present

## 2021-01-01 DIAGNOSIS — Z743 Need for continuous supervision: Secondary | ICD-10-CM | POA: Diagnosis not present

## 2021-01-01 DIAGNOSIS — Z79899 Other long term (current) drug therapy: Secondary | ICD-10-CM | POA: Diagnosis not present

## 2021-01-01 DIAGNOSIS — K5730 Diverticulosis of large intestine without perforation or abscess without bleeding: Secondary | ICD-10-CM | POA: Diagnosis not present

## 2021-01-01 DIAGNOSIS — R451 Restlessness and agitation: Secondary | ICD-10-CM | POA: Diagnosis not present

## 2021-01-01 DIAGNOSIS — R079 Chest pain, unspecified: Secondary | ICD-10-CM | POA: Diagnosis not present

## 2021-01-01 DIAGNOSIS — D500 Iron deficiency anemia secondary to blood loss (chronic): Secondary | ICD-10-CM | POA: Diagnosis not present

## 2021-01-01 DIAGNOSIS — K224 Dyskinesia of esophagus: Secondary | ICD-10-CM | POA: Diagnosis not present

## 2021-01-01 DIAGNOSIS — Z87891 Personal history of nicotine dependence: Secondary | ICD-10-CM | POA: Diagnosis not present

## 2021-01-01 MED FILL — Irbesartan Tab 300 MG: ORAL | 30 days supply | Qty: 30 | Fill #2 | Status: AC

## 2021-01-02 ENCOUNTER — Encounter (HOSPITAL_COMMUNITY)
Admission: EM | Disposition: A | Discharge status: Home / Self Care | Payer: Self-pay | Source: Home / Self Care | Attending: Internal Medicine

## 2021-01-02 ENCOUNTER — Inpatient Hospital Stay (HOSPITAL_COMMUNITY): Payer: Medicare Other | Admitting: Anesthesiology

## 2021-01-02 ENCOUNTER — Inpatient Hospital Stay (HOSPITAL_COMMUNITY)
Admission: EM | Admit: 2021-01-02 | Discharge: 2021-01-16 | DRG: 378 | Disposition: A | Discharge status: Home Health | Payer: Medicare Other | Attending: Family Medicine | Admitting: Family Medicine

## 2021-01-02 ENCOUNTER — Encounter (HOSPITAL_COMMUNITY): Payer: Self-pay

## 2021-01-02 DIAGNOSIS — I248 Other forms of acute ischemic heart disease: Secondary | ICD-10-CM | POA: Diagnosis present

## 2021-01-02 DIAGNOSIS — I951 Orthostatic hypotension: Secondary | ICD-10-CM | POA: Diagnosis not present

## 2021-01-02 DIAGNOSIS — I739 Peripheral vascular disease, unspecified: Secondary | ICD-10-CM | POA: Diagnosis present

## 2021-01-02 DIAGNOSIS — D62 Acute posthemorrhagic anemia: Secondary | ICD-10-CM | POA: Diagnosis present

## 2021-01-02 DIAGNOSIS — K449 Diaphragmatic hernia without obstruction or gangrene: Secondary | ICD-10-CM | POA: Diagnosis present

## 2021-01-02 DIAGNOSIS — K921 Melena: Secondary | ICD-10-CM | POA: Diagnosis not present

## 2021-01-02 DIAGNOSIS — N400 Enlarged prostate without lower urinary tract symptoms: Secondary | ICD-10-CM | POA: Diagnosis present

## 2021-01-02 DIAGNOSIS — K5731 Diverticulosis of large intestine without perforation or abscess with bleeding: Principal | ICD-10-CM | POA: Diagnosis present

## 2021-01-02 DIAGNOSIS — Z79899 Other long term (current) drug therapy: Secondary | ICD-10-CM

## 2021-01-02 DIAGNOSIS — K5900 Constipation, unspecified: Secondary | ICD-10-CM | POA: Diagnosis present

## 2021-01-02 DIAGNOSIS — R110 Nausea: Secondary | ICD-10-CM | POA: Diagnosis not present

## 2021-01-02 DIAGNOSIS — K648 Other hemorrhoids: Secondary | ICD-10-CM | POA: Diagnosis present

## 2021-01-02 DIAGNOSIS — Z20822 Contact with and (suspected) exposure to covid-19: Secondary | ICD-10-CM | POA: Diagnosis present

## 2021-01-02 DIAGNOSIS — G9340 Encephalopathy, unspecified: Secondary | ICD-10-CM | POA: Diagnosis not present

## 2021-01-02 DIAGNOSIS — Z781 Physical restraint status: Secondary | ICD-10-CM

## 2021-01-02 DIAGNOSIS — K224 Dyskinesia of esophagus: Secondary | ICD-10-CM | POA: Diagnosis present

## 2021-01-02 DIAGNOSIS — K2950 Unspecified chronic gastritis without bleeding: Secondary | ICD-10-CM | POA: Diagnosis present

## 2021-01-02 DIAGNOSIS — G20 Parkinson's disease: Secondary | ICD-10-CM | POA: Diagnosis present

## 2021-01-02 DIAGNOSIS — I10 Essential (primary) hypertension: Secondary | ICD-10-CM | POA: Diagnosis present

## 2021-01-02 DIAGNOSIS — E785 Hyperlipidemia, unspecified: Secondary | ICD-10-CM | POA: Diagnosis present

## 2021-01-02 DIAGNOSIS — K219 Gastro-esophageal reflux disease without esophagitis: Secondary | ICD-10-CM | POA: Diagnosis present

## 2021-01-02 DIAGNOSIS — K922 Gastrointestinal hemorrhage, unspecified: Secondary | ICD-10-CM | POA: Diagnosis not present

## 2021-01-02 DIAGNOSIS — F05 Delirium due to known physiological condition: Secondary | ICD-10-CM | POA: Diagnosis not present

## 2021-01-02 DIAGNOSIS — R5381 Other malaise: Secondary | ICD-10-CM | POA: Diagnosis present

## 2021-01-02 DIAGNOSIS — H538 Other visual disturbances: Secondary | ICD-10-CM | POA: Diagnosis not present

## 2021-01-02 DIAGNOSIS — R42 Dizziness and giddiness: Secondary | ICD-10-CM | POA: Diagnosis not present

## 2021-01-02 DIAGNOSIS — K625 Hemorrhage of anus and rectum: Secondary | ICD-10-CM | POA: Diagnosis not present

## 2021-01-02 DIAGNOSIS — Z87891 Personal history of nicotine dependence: Secondary | ICD-10-CM

## 2021-01-02 HISTORY — PX: ESOPHAGOGASTRODUODENOSCOPY (EGD) WITH PROPOFOL: SHX5813

## 2021-01-02 LAB — COMPREHENSIVE METABOLIC PANEL
ALT: 8 U/L (ref 0–44)
AST: 13 U/L — ABNORMAL LOW (ref 15–41)
Albumin: 3.1 g/dL — ABNORMAL LOW (ref 3.5–5.0)
Alkaline Phosphatase: 37 U/L — ABNORMAL LOW (ref 38–126)
Anion gap: 5 (ref 5–15)
BUN: 47 mg/dL — ABNORMAL HIGH (ref 8–23)
CO2: 24 mmol/L (ref 22–32)
Calcium: 8 mg/dL — ABNORMAL LOW (ref 8.9–10.3)
Chloride: 104 mmol/L (ref 98–111)
Creatinine, Ser: 1.18 mg/dL (ref 0.61–1.24)
GFR, Estimated: >60 mL/min (ref >60)
Glucose, Bld: 95 mg/dL (ref 70–99)
Potassium: 4 mmol/L (ref 3.5–5.1)
Sodium: 133 mmol/L — ABNORMAL LOW (ref 135–145)
Total Bilirubin: 0.5 mg/dL (ref 0.3–1.2)
Total Protein: 5.3 g/dL — ABNORMAL LOW (ref 6.5–8.1)

## 2021-01-02 LAB — CBC
HCT: 27.9 % — ABNORMAL LOW (ref 39.0–52.0)
Hemoglobin: 9.1 g/dL — ABNORMAL LOW (ref 13.0–17.0)
MCH: 31.7 pg (ref 26.0–34.0)
MCHC: 32.6 g/dL (ref 30.0–36.0)
MCV: 97.2 fL (ref 80.0–100.0)
Platelets: 193 10*3/uL (ref 150–400)
RBC: 2.87 MIL/uL — ABNORMAL LOW (ref 4.22–5.81)
RDW: 12.2 % (ref 11.5–15.5)
WBC: 8.2 10*3/uL (ref 4.0–10.5)
nRBC: 0 % (ref 0.0–0.2)

## 2021-01-02 LAB — ABO/RH: ABO/RH(D): B POS

## 2021-01-02 LAB — HEMOGLOBIN AND HEMATOCRIT, BLOOD
HCT: 30.3 % — ABNORMAL LOW (ref 39.0–52.0)
Hemoglobin: 9.8 g/dL — ABNORMAL LOW (ref 13.0–17.0)

## 2021-01-02 LAB — RESP PANEL BY RT-PCR (FLU A&B, COVID) ARPGX2
Influenza A by PCR: NEGATIVE
Influenza B by PCR: NEGATIVE
SARS Coronavirus 2 by RT PCR: NEGATIVE

## 2021-01-02 SURGERY — ESOPHAGOGASTRODUODENOSCOPY (EGD) WITH PROPOFOL
Anesthesia: Monitor Anesthesia Care

## 2021-01-02 MED ORDER — CEREFOLIN 6-1-50-5 MG PO TABS: 1.0000 | ORAL_TABLET | Freq: Every day | ORAL | Status: DC

## 2021-01-02 MED ORDER — DOCUSATE SODIUM 100 MG PO CAPS
200.0000 mg | ORAL_CAPSULE | Freq: Every day | ORAL | Status: DC
  Administered 2021-01-03 – 2021-01-07 (×3): 200 mg via ORAL
  Filled 2021-01-02 (×3): qty 2

## 2021-01-02 MED ORDER — CITALOPRAM HYDROBROMIDE 20 MG PO TABS
20.0000 mg | ORAL_TABLET | Freq: Every day | ORAL | Status: DC
  Administered 2021-01-03 – 2021-01-16 (×12): 20 mg via ORAL
  Filled 2021-01-02 (×13): qty 1

## 2021-01-02 MED ORDER — BISMUTH SUBSALICYLATE 262 MG PO CHEW
524.0000 mg | CHEWABLE_TABLET | ORAL | Status: DC | PRN
  Filled 2021-01-02: qty 2

## 2021-01-02 MED ORDER — ADULT MULTIVITAMIN W/MINERALS CH
1.0000 | ORAL_TABLET | Freq: Every day | ORAL | Status: DC
  Administered 2021-01-03 – 2021-01-16 (×12): 1 via ORAL
  Filled 2021-01-02 (×13): qty 1

## 2021-01-02 MED ORDER — TIMOLOL MALEATE 0.5 % OP SOLN
1.0000 [drp] | Freq: Every day | OPHTHALMIC | Status: DC
  Administered 2021-01-03 – 2021-01-16 (×12): 1 [drp] via OPHTHALMIC
  Filled 2021-01-02 (×3): qty 5

## 2021-01-02 MED ORDER — PROPOFOL 10 MG/ML IV BOLUS
INTRAVENOUS | Status: AC
  Filled 2021-01-02: qty 20

## 2021-01-02 MED ORDER — ACETAMINOPHEN 500 MG PO TABS
500.0000 mg | ORAL_TABLET | Freq: Four times a day (QID) | ORAL | Status: DC | PRN
  Administered 2021-01-03 – 2021-01-08 (×3): 500 mg via ORAL
  Filled 2021-01-02 (×3): qty 1

## 2021-01-02 MED ORDER — PANTOPRAZOLE SODIUM 40 MG IV SOLR
40.0000 mg | Freq: Two times a day (BID) | INTRAVENOUS | Status: DC
  Administered 2021-01-02 – 2021-01-09 (×14): 40 mg via INTRAVENOUS
  Filled 2021-01-02 (×14): qty 40

## 2021-01-02 MED ORDER — PROPOFOL 500 MG/50ML IV EMUL
INTRAVENOUS | Status: DC | PRN
  Administered 2021-01-02: 135 ug/kg/min via INTRAVENOUS

## 2021-01-02 MED ORDER — SIMVASTATIN 40 MG PO TABS
40.0000 mg | ORAL_TABLET | Freq: Every day | ORAL | Status: DC
  Administered 2021-01-02 – 2021-01-05 (×4): 40 mg via ORAL
  Filled 2021-01-02 (×5): qty 1

## 2021-01-02 MED ORDER — LACTATED RINGERS IV SOLN
INTRAVENOUS | Status: DC
  Administered 2021-01-02: 1000 mL via INTRAVENOUS

## 2021-01-02 MED ORDER — PROPOFOL 500 MG/50ML IV EMUL
INTRAVENOUS | Status: AC
  Filled 2021-01-02: qty 50

## 2021-01-02 MED ORDER — CARBIDOPA-LEVODOPA 25-100 MG PO TABS
1.5000 | ORAL_TABLET | Freq: Three times a day (TID) | ORAL | Status: DC
  Administered 2021-01-02 – 2021-01-16 (×40): 1.5 via ORAL
  Filled 2021-01-02 (×3): qty 1.5
  Filled 2021-01-02: qty 2
  Filled 2021-01-02: qty 1.5
  Filled 2021-01-02: qty 2
  Filled 2021-01-02 (×13): qty 1.5
  Filled 2021-01-02: qty 2
  Filled 2021-01-02 (×17): qty 1.5
  Filled 2021-01-02 (×2): qty 2
  Filled 2021-01-02 (×2): qty 1.5
  Filled 2021-01-02: qty 2
  Filled 2021-01-02 (×2): qty 1.5

## 2021-01-02 MED ORDER — ALFUZOSIN HCL ER 10 MG PO TB24
10.0000 mg | ORAL_TABLET | Freq: Every day | ORAL | Status: DC
  Administered 2021-01-02 – 2021-01-09 (×8): 10 mg via ORAL
  Filled 2021-01-02 (×9): qty 1

## 2021-01-02 MED ORDER — TRIHEXYPHENIDYL HCL 2 MG PO TABS
1.0000 mg | ORAL_TABLET | Freq: Two times a day (BID) | ORAL | Status: DC
  Administered 2021-01-02 – 2021-01-16 (×26): 1 mg via ORAL
  Filled 2021-01-02 (×32): qty 1

## 2021-01-02 MED ORDER — PROPOFOL 10 MG/ML IV BOLUS
INTRAVENOUS | Status: DC | PRN
  Administered 2021-01-02: 20 mg via INTRAVENOUS

## 2021-01-02 SURGICAL SUPPLY — 15 items

## 2021-01-02 NOTE — ED Notes (Signed)
Called 4W to request charge RN assign pt to RN upstairs and post purple man on trackboard

## 2021-01-02 NOTE — H&P (Signed)
History and Physical    ROGAN WIGLEY JGG:[id number] DOB: August 04, 1950 DOA: 01/02/2021  PCP: Denita Lung, MD (Confirm with patient/family/NH records and if not entered, this has to be entered at [facility] point of entry) Patient coming from: [facility]  I have personally briefly reviewed patient's old medical records in [place]  Chief Complaint: Feeling OK  HPI: [name] is a 70 y.o. male with medical history significant of Parkinson disease, BPH, HTN, HLD, GI bleed presented with melena.  Patient was constipated for last 2 days, and this morning all of a sudden he passed large amount black tarry stool, then 1 hour later he started to pass maroon-colored stool and started to feel lightheaded.  Patient has had worsening of constipation and congestion and has been taking meloxicam for the last 2 weeks.  He denies any chest pains, fall or loss of consciousness.  This morning, patient went to see his GI who sent him to ED.  ED Course: Vital signs stable, no hypotension no tachycardia, hemoglobin 9.0 compared to baseline > 14.  Review of Systems: As per HPI otherwise 14 point review of systems negative.    Past Medical History:  Diagnosis Date   Allergy    Arthritis    BPH (benign prostatic hyperplasia)    Complication of anesthesia    Pt. stated he had a reaction that ended in him requiring urinary cath placement   Diverticulosis    GERD (gastroesophageal reflux disease)    esophageal spasms   Glaucoma    Gout    Head injury, closed, with concussion    Hepatitis C    chronic - Has been treated with Harvoni   HLD (hyperlipidemia)    statin intolerant (Crestor & Simvastatin) - Taking Livalo 1mg  / week   Hypertension    Parkinson's disease ([place])    Plantar fasciitis    right   PVD (peripheral vascular disease) ([place])    With no claudication; only mild abdominal aortic atherosclerosis noted on ultrasound.   Thoracic ascending aortic aneurysm (HCC)    4.2 cm ascending TAA  09/2016 CT, 1 yr f/u rec   Ulcer     Past Surgical History:  Procedure Laterality Date   BUNIONECTOMY WITH WEIL OSTEOTOMY Right 11/02/2019   Procedure: Right Foot Lapidus, Modified McBride Bunionectomy,  Hallux Akin Osteotomy;  Surgeon: Wylene Simmer, MD;  Location: [facility];  Service: Orthopedics;  Laterality: Right;   CARDIAC CATHETERIZATION  2005   30% Cx. Dr. Melvern Banker   cataract surgery Left 11/05/2014   COLONOSCOPY     ESOPHAGOGASTRODUODENOSCOPY N/A 05/02/2015   Procedure: ESOPHAGOGASTRODUODENOSCOPY (EGD);  Surgeon: Ronald Lobo, MD;  Location: [facility];  Service: Endoscopy;  Laterality: N/A;   ESOPHAGOGASTRODUODENOSCOPY  05/02/2015   no source of pt chest pain endoscopically evident. small hiatal hernia.   EYE SURGERY     HARDWARE REMOVAL Right 11/02/2019   Procedure: Second Metatarsal Removal of Deep Implant and Rotational Osteotomy;  Surgeon: Wylene Simmer, MD;  Location: [facility];  Service: Orthopedics;  Laterality: Right;   MEMBRANE PEEL Left 03/14/2014   Procedure: MEMBRANE PEEL; ENDOLASER;  Surgeon: Hurman Horn, MD;  Location: [facility];  Service: Ophthalmology;  Laterality: Left;   NM MYOVIEW LTD  2011   Neg Ischemia or infarct.   NM MYOVIEW LTD  10/2015   LOW RISK. Small, fixed basal lateral defect - likely diaphragmatic attenuation. EF 69%   PARS PLANA VITRECTOMY Left 03/14/2014   Procedure: PARS  PLANA VITRECTOMY WITH 25 GAUGE;  Surgeon: Hurman Horn, MD;  Location: [facility];  Service: Ophthalmology;  Laterality: Left;   TONSILLECTOMY     TRANSTHORACIC ECHOCARDIOGRAM  2013   Normal EF. No significant Valve Disease   UPPER GASTROINTESTINAL ENDOSCOPY     WEIL OSTEOTOMY Right 09/01/2017   Procedure: RIGHT GREAT TOE CHEVRON AND WEIL OSTEOTOMY 2ND METATARSAL;  Surgeon: Newt Minion, MD;  Location: [facility];  Service: Orthopedics;  Laterality: Right;     reports that he quit smoking about 32 years ago. His smoking use included cigars. He has never used  smokeless tobacco. He reports current alcohol use of about 3.0 standard drinks of alcohol per week. He reports that he does not use drugs.  No Known Allergies  Family History  Problem Relation Age of Onset   Heart disease Mother    Cancer Paternal Grandfather    Cancer Sister      Prior to Admission medications   Medication Sig Start Date End Date Taking? Authorizing Provider  acetaminophen (TYLENOL) 500 MG tablet Take 500 mg by mouth every 6 (six) hours as needed for mild pain, fever or headache.   Yes [provider]  alfuzosin (UROXATRAL) 10 MG 24 hr tablet TAKE 1 TABLET BY MOUTH ONCE A DAY Patient taking differently: Take 10 mg by mouth daily. 11/28/20 11/28/21 Yes MacDiarmid, Nicki Reaper, MD  bismuth subsalicylate (PEPTO BISMOL) 262 MG chewable tablet Chew 524 mg by mouth as needed for diarrhea or loose stools.   Yes [provider]  carbidopa-levodopa (SINEMET IR) 25-100 MG tablet TAKE 1 AND 1/2 TABLETS BY MOUTH THREE TIMES DAILY Patient taking differently: Take 1.5 tablets by mouth 3 (three) times daily. 08/21/20 08/21/21 Yes Garvin Fila, MD  citalopram (CELEXA) 40 MG tablet TAKE 1 TABLET BY MOUTH ONCE A DAY Patient taking differently: Take 20 mg by mouth daily. 09/10/20 09/10/21 Yes Denita Lung, MD  docusate sodium (COLACE) 100 MG capsule Take 200 mg by mouth daily.   Yes [provider]  irbesartan (AVAPRO) 300 MG tablet TAKE 1 TABLET BY MOUTH EVERY MORNING Patient taking differently: Take 300 mg by mouth every morning. 03/14/20 03/14/21 Yes [name], MD  isosorbide mononitrate (IMDUR) 30 MG 24 hr tablet Take 1 tablet (30 mg total) by mouth daily.  (Take 1/2 hour after taking tylenol) 12/24/20 03/24/21 Yes [name], MD  L-Methylfolate-B12-B6-B2 (CEREFOLIN) 12-25-48-5 MG TABS Take 1 tablet by mouth daily. 10/01/20  Yes Garvin Fila, MD  Multiple Vitamin (MULTIVITAMIN WITH MINERALS) TABS tablet Take 1 tablet by mouth daily.   Yes [provider]  nitroGLYCERIN (NITROSTAT) 0.4 MG SL tablet Place 1 tablet (0.4 mg total) under the tongue every 5 (five) minutes as needed for chest pain. 11/28/20  Yes [name], MD  pantoprazole (PROTONIX) 40 MG tablet Take 40 mg by mouth 2 (two) times daily. 06/17/15  Yes [provider]  simvastatin (ZOCOR) 40 MG tablet TAKE 1 TABLET BY MOUTH AT BEDTIME Patient taking differently: Take 40 mg by mouth at bedtime. 08/22/20 08/22/21 Yes Denita Lung, MD  timolol (TIMOPTIC) 0.5 % ophthalmic solution Place 1 drop into both eyes daily.   Yes [provider]  trihexyphenidyl (ARTANE) 2 MG tablet TAKE 1/2 TABLET BY MOUTH 2 TIMES DAILY WITH A MEAL Patient taking differently: Take 1 mg by mouth 2 (two) times daily with a meal. 10/01/20 10/01/21 Yes Garvin Fila, MD  verapamil (CALAN-SR) 240 MG CR tablet  TAKE 1 TABLET BY MOUTH AT BEDTIME. Patient taking differently: Take 240 mg by mouth at bedtime. 02/08/20 02/19/21 Yes [name], MD  VITAMIN D PO Take 1 capsule by mouth daily.   Yes [provider]  meloxicam (MOBIC) 15 MG tablet Take 1 tablet by mouth with a meal once daily for 14 days. Patient not taking: Reported on 01/02/2021 12/19/20     metoprolol tartrate (LOPRESSOR) 50 MG tablet Take 2 tablets (100 mg total) by mouth once for 1 dose. TAKE TWO HOURS PRIOR TO  SCHEDULE CARDIAC TEST 12/24/20 12/25/20  [name], MD  predniSONE (DELTASONE) 10 MG tablet Take 3 tablets by mouth for 3 days, take 2 tablets for 2 days then take 1 tablet for 1 day 12/26/20       Physical Exam: Vitals:   01/02/21 1721 01/02/21 1724 01/02/21 1729 01/02/21 1730  BP: 98/67 111/71 124/82 130/77  Pulse: 70 66 65 66  Resp:      Temp:      TempSrc:      SpO2: 100% 100% 100% 99%  Weight:      Height:        Constitutional: NAD, calm, comfortable Vitals:   01/02/21 1721 01/02/21 1724 01/02/21 1729 01/02/21 1730  BP: 98/67 111/71 124/82 130/77  Pulse: 70 66 65 66  Resp:      Temp:       TempSrc:      SpO2: 100% 100% 100% 99%  Weight:      Height:       Eyes: PERRL, lids and conjunctivae normal ENMT: Mucous membranes are moist. Posterior pharynx clear of any exudate or lesions.Normal dentition.  Neck: normal, supple, no masses, no thyromegaly Respiratory: clear to auscultation bilaterally, no wheezing, no crackles. Normal respiratory effort. No accessory muscle use.  Cardiovascular: Regular rate and rhythm, no murmurs / rubs / gallops. No extremity edema. 2+ pedal pulses. No carotid bruits.  Abdomen: no tenderness, no masses palpated. No hepatosplenomegaly. Bowel sounds positive.  Musculoskeletal: no clubbing / cyanosis. No joint deformity upper and lower extremities. Good ROM, no contractures. Normal muscle tone.  Skin: no rashes, lesions, ulcers. No induration Neurologic: CN 2-12 grossly intact. Sensation intact, DTR normal. Strength 5/5 in all 4.  Psychiatric: Normal judgment and insight. Alert and oriented x 3. Normal mood.    Labs on Admission: I have personally reviewed following labs and imaging studies  CBC: Recent Labs  Lab 01/02/21 1238  WBC 8.2  HGB 9.1*  HCT 27.9*  MCV 97.2  PLT 824   Basic Metabolic Panel: Recent Labs  Lab 01/02/21 1238  NA 133*  K 4.0  CL 104  CO2 24  GLUCOSE 95  BUN 47*  CREATININE 1.18  CALCIUM 8.0*   GFR: Estimated Creatinine Clearance: 59 mL/min (by C-G formula based on SCr of 1.18 mg/dL). Liver Function Tests: Recent Labs  Lab 01/02/21 1238  AST 13*  ALT 8  ALKPHOS 37*  BILITOT 0.5  PROT 5.3*  ALBUMIN 3.1*   No results for input(s): LIPASE, AMYLASE in the last 168 hours. No results for input(s): AMMONIA in the last 168 hours. Coagulation Profile: No results for input(s): INR, PROTIME in the last 168 hours. Cardiac Enzymes: No results for input(s): CKTOTAL, CKMB, CKMBINDEX, TROPONINI in the last 168 hours. BNP (last 3 results) No results for input(s): PROBNP in the last 8760 hours. HbA1C: No results  for input(s): HGBA1C in the last 72 hours. CBG: No results for input(s): GLUCAP  in the last 168 hours. Lipid Profile: No results for input(s): CHOL, HDL, LDLCALC, TRIG, CHOLHDL, LDLDIRECT in the last 72 hours. Thyroid Function Tests: No results for input(s): TSH, T4TOTAL, FREET4, T3FREE, THYROIDAB in the last 72 hours. Anemia Panel: No results for input(s): VITAMINB12, FOLATE, FERRITIN, TIBC, IRON, RETICCTPCT in the last 72 hours. Urine analysis:    Component Value Date/Time   COLORURINE YELLOW 04/18/2018 1019   APPEARANCEUR CLEAR 04/18/2018 1019   LABSPEC 1.010 01/17/2020 1502   PHURINE 7.0 04/18/2018 1019   GLUCOSEU NEGATIVE 04/18/2018 1019   HGBUR NEGATIVE 04/18/2018 1019   BILIRUBINUR negative 01/17/2020 1502   KETONESUR negative 01/17/2020 1502   KETONESUR NEGATIVE 04/18/2018 1019   PROTEINUR negative 01/17/2020 1502   PROTEINUR NEGATIVE 04/18/2018 1019   UROBILINOGEN 0.2 03/14/2014 2107   NITRITE Negative 01/17/2020 1502   NITRITE NEGATIVE 04/18/2018 1019   LEUKOCYTESUR Negative 01/17/2020 1502    Radiological Exams on Admission: OCT, Retina - OU - Both Eyes  Result Date: 01/02/2021 Right Eye Quality was good. Scan locations included subfoveal. Central Foveal Thickness: 205. Findings include outer retinal atrophy, central retinal atrophy. Left Eye Quality was good. Scan locations included subfoveal. Central Foveal Thickness: 381. Progression has been stable. Notes Diffuse macular atrophy right eye no interval change Mild diffuse macular thickening left eye with no active CME.  No change in foveal elevation over the last 2 years. Minor CME superonasal, but this is old and involutional and unchanged for years with no encroachment to center of the fovea  Color Fundus Photography Optos - OU - Both Eyes  Result Date: 01/02/2021 Right Eye Progression has been stable. Notes OD with cloudy media secondary to corneal opacity, striae from corneal edema Retina attached, optic nerve  atrophy good PRP peripheral OS, mild temporal optic atrophy. 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, no active macular edema  VAS US CAROTID  Result Date: 01/02/2021 Carotid Arterial Duplex Study Patient Name:  EMORI KAMAU  Date of Exam:   01/01/2021 Medical Rec #: [id number]        Accession #:    [id number] Date of Birth: 1950-08-03        Patient Gender: M Patient Age:   26Y Exam Location:  [facility] Procedure:      VAS US CAROTID Referring Phys: [id number] DAVID W HARDING --------------------------------------------------------------------------------  Indications:       Patient reports intermittent tingling sensations in the left                    jaw area, across the left chest area at surround the front                    area of the left knee. Symptoms do not always come in all                    areas at the same time. These symptoms have been going on for                    one to two months. He also states he has been having                    headaches for the past week but relates it to a new                    medication he is taking (could not recall the name). 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 Lightheadedness when he has to look                    over the right shoulder while driving. He denies any other                    cerebrovascular symptoms. Risk Factors:      Hyperlipidemia, past history of smoking. Comparison Study:  NA Performing Technologist: Sharlett Iles RVT  Examination Guidelines: A complete evaluation includes B-mode imaging, spectral Doppler, color Doppler, and power Doppler as needed of all accessible portions of each vessel. Bilateral testing is considered an integral part of a complete examination. Limited examinations for reoccurring indications may be performed as noted.  Right Carotid Findings: +----------+--------+--------+--------+------------------+--------+           PSV cm/sEDV  cm/sStenosisPlaque DescriptionComments +----------+--------+--------+--------+------------------+--------+ CCA Prox  53      6                                 tortuous +----------+--------+--------+--------+------------------+--------+ CCA Distal57      13                                         +----------+--------+--------+--------+------------------+--------+ ICA Prox  43      10      1-39%   heterogenous               +----------+--------+--------+--------+------------------+--------+ ICA Mid   41      17              heterogenous               +----------+--------+--------+--------+------------------+--------+ ICA Distal35      17                                         +----------+--------+--------+--------+------------------+--------+ ECA       77      10                                         +----------+--------+--------+--------+------------------+--------+ +----------+--------+-------+---------+-------------------+           PSV cm/sEDV cmsDescribe Arm Pressure (mmHG) +----------+--------+-------+---------+-------------------+ [id number]            Turbulent117                 +----------+--------+-------+---------+-------------------+ +---------+--------+--+--------+-+---------------------------+ VertebralPSV cm/s26EDV cm/s6Antegrade and Small caliber +---------+--------+--+--------+-+---------------------------+  Left Carotid Findings: +----------+--------+--------+--------+------------------+--------+           PSV cm/sEDV cm/sStenosisPlaque DescriptionComments +----------+--------+--------+--------+------------------+--------+ CCA Prox  81      14                                         +----------+--------+--------+--------+------------------+--------+ CCA Distal78      15                                         +----------+--------+--------+--------+------------------+--------+  ICA Prox  101     33       1-39%   heterogenous               +----------+--------+--------+--------+------------------+--------+ ICA Mid   49      17                                         +----------+--------+--------+--------+------------------+--------+ ICA Distal52      24                                         +----------+--------+--------+--------+------------------+--------+ ECA       81      10                                         +----------+--------+--------+--------+------------------+--------+ +----------+--------+--------+----------------+-------------------+           PSV cm/sEDV cm/sDescribe        Arm Pressure (mmHG) +----------+--------+--------+----------------+-------------------+ Subclavian160             Multiphasic, [id number]                 +----------+--------+--------+----------------+-------------------+ +---------+--------+--+--------+--+---------+ VertebralPSV cm/s37EDV cm/s13Antegrade +---------+--------+--+--------+--+---------+   Summary: Right Carotid: Velocities in the right ICA are consistent with a 1-39% stenosis. Left Carotid: Velocities in the left ICA are consistent with a 1-39% stenosis. Vertebrals:  Bilateral vertebral arteries demonstrate antegrade flow. Small              caliber right vertebral artery. Subclavians: Right subclavian artery flow was disturbed. Normal flow              hemodynamics were seen in the left subclavian artery. *See table(s) above for measurements and observations.  Electronically signed by Larae Grooms MD on 01/02/2021 at 2:23:23 PM.    Final     EKG: Independently reviewed.  Sinus bradycardia  Assessment/Plan Active Problems:   GI bleed  (please populate well all problems here in Problem List. (For example, if patient is on BP meds at home and you resume or decide to hold them, it is a problem that needs to be her. Same for CAD, COPD, HLD and so on)   Upper GI bleed -Suspect bleeding ulcer, n.p.o. and PPI twice  daily -EGD this afternoon. -Repeat H&H tonight, transfuse as needed for hemodynamically unstable.  Acute blood loss anemia -As above.  HTN -Hold BP meds  Parkinson disease -Continue Sinemet  DVT prophylaxis: SCD Code Status: Full code Family Communication: None at bedside Disposition Plan: Expect more than 2 midnight hospital stay for GI work-up. Consults called: [facility] Admission status: Telemetry admission   Lequita Halt MD [facility] Pager [phone]  01/02/2021, 5:37 PM

## 2021-01-02 NOTE — ED Notes (Signed)
Awaiting pharmacy to reconcile & verify medications.

## 2021-01-02 NOTE — Consult Note (Signed)
Reason for Consult: GI bleed Referring Physician: Hospital team  [name] is an 70 y.o. male.  HPI: Patient familiar to Dr. Cristina Gong and seen in our office today briefly by our PA and sent to the emergency room for active GI bleeding and his case discussed with our PA as well as the patient and his wife and he was fine until 5 AM this morning when he began passing both some black stool and bright red blood and has been weak dizzy and shaky and has done that about 6 times and has not had a bowel movement since he has been here and he was on meloxicam for 2 weeks but no other aspirin or nonsteroidals he was also started on prednisone recently as well and we reviewed his previous colonoscopy and endoscopy in both his hospital computer chart in our office computer chart was reviewed and his family history is negative and he has no other complaints  Past Medical History:  Diagnosis Date   Allergy    Arthritis    BPH (benign prostatic hyperplasia)    Complication of anesthesia    Pt. stated he had a reaction that ended in him requiring urinary cath placement   Diverticulosis    GERD (gastroesophageal reflux disease)    esophageal spasms   Glaucoma    Gout    Head injury, closed, with concussion    Hepatitis C    chronic - Has been treated with Harvoni   HLD (hyperlipidemia)    statin intolerant (Crestor & Simvastatin) - Taking Livalo 1mg  / week   Hypertension    Parkinson's disease ([place])    Plantar fasciitis    right   PVD (peripheral vascular disease) ([place])    With no claudication; only mild abdominal aortic atherosclerosis noted on ultrasound.   Thoracic ascending aortic aneurysm (HCC)    4.2 cm ascending TAA 09/2016 CT, 1 yr f/u rec   Ulcer     Past Surgical History:  Procedure Laterality Date   BUNIONECTOMY WITH WEIL OSTEOTOMY Right 11/02/2019   Procedure: Right Foot Lapidus, Modified McBride Bunionectomy,  Hallux Akin Osteotomy;  Surgeon: Wylene Simmer, MD;  Location: [facility];  Service: Orthopedics;  Laterality: Right;   CARDIAC CATHETERIZATION  2005   30% Cx. Dr. Melvern Banker   cataract surgery Left 11/05/2014   COLONOSCOPY     ESOPHAGOGASTRODUODENOSCOPY N/A 05/02/2015   Procedure: ESOPHAGOGASTRODUODENOSCOPY (EGD);  Surgeon: Ronald Lobo, MD;  Location: [facility];  Service: Endoscopy;  Laterality: N/A;   ESOPHAGOGASTRODUODENOSCOPY  05/02/2015   no source of pt chest pain endoscopically evident. small hiatal hernia.   EYE SURGERY     HARDWARE REMOVAL Right 11/02/2019   Procedure: Second Metatarsal Removal of Deep Implant and Rotational Osteotomy;  Surgeon: Wylene Simmer, MD;  Location: [place];  Service: Orthopedics;  Laterality: Right;   MEMBRANE PEEL Left 03/14/2014   Procedure: MEMBRANE PEEL; ENDOLASER;  Surgeon: Hurman Horn, MD;  Location: [facility];  Service: Ophthalmology;  Laterality: Left;   NM MYOVIEW LTD  2011   Neg Ischemia or infarct.   NM MYOVIEW LTD  10/2015   LOW RISK. Small, fixed basal lateral defect - likely diaphragmatic attenuation. EF 69%   PARS PLANA VITRECTOMY Left 03/14/2014   Procedure: PARS PLANA VITRECTOMY WITH 25 GAUGE;  Surgeon: Hurman Horn, MD;  Location: [facility];  Service: Ophthalmology;  Laterality: Left;   TONSILLECTOMY     TRANSTHORACIC ECHOCARDIOGRAM  2013   Normal EF. No  significant Valve Disease   UPPER GASTROINTESTINAL ENDOSCOPY     WEIL OSTEOTOMY Right 09/01/2017   Procedure: RIGHT GREAT TOE CHEVRON AND WEIL OSTEOTOMY 2ND METATARSAL;  Surgeon: Newt Minion, MD;  Location: [facility];  Service: Orthopedics;  Laterality: Right;    Family History  Problem Relation Age of Onset   Heart disease Mother    Cancer Paternal Grandfather    Cancer Sister     Social History:  reports that he quit smoking about 32 years ago. His smoking use included cigars. He has never used smokeless tobacco. He reports current alcohol use of about 3.0 standard drinks of alcohol per week. He reports that he does not use  drugs.  Allergies: No Known Allergies  Medications: I have reviewed the patient's current medications.  Results for orders placed or performed during the hospital encounter of 01/02/21 (from the past 48 hour(s))  Comprehensive metabolic panel     Status: Abnormal   Collection Time: 01/02/21 12:38 PM  Result Value Ref Range   Sodium 133 (L) 135 - 145 mmol/L   Potassium 4.0 3.5 - 5.1 mmol/L   Chloride 104 98 - 111 mmol/L   CO2 24 22 - 32 mmol/L   Glucose, Bld 95 70 - 99 mg/dL    Comment: Glucose reference range applies only to samples taken after fasting for at least 8 hours.   BUN 47 (H) 8 - 23 mg/dL   Creatinine, Ser 1.18 0.61 - 1.24 mg/dL   Calcium 8.0 (L) 8.9 - 10.3 mg/dL   Total Protein 5.3 (L) 6.5 - 8.1 g/dL   Albumin 3.1 (L) 3.5 - 5.0 g/dL   AST 13 (L) 15 - 41 U/L   ALT 8 0 - 44 U/L   Alkaline Phosphatase 37 (L) 38 - 126 U/L   Total Bilirubin 0.5 0.3 - 1.2 mg/dL   GFR, Estimated >60 >60 mL/min    Comment: (NOTE) Calculated using the CKD-EPI Creatinine Equation (2021)    Anion gap 5 5 - 15    Comment: Performed at [facility], [place] [address], [place], [place] [address]  CBC     Status: Abnormal   Collection Time: 01/02/21 12:38 PM  Result Value Ref Range   WBC 8.2 4.0 - 10.5 K/uL   RBC 2.87 (L) 4.22 - 5.81 MIL/uL   Hemoglobin 9.1 (L) 13.0 - 17.0 g/dL   HCT 27.9 (L) 39.0 - 52.0 %   MCV 97.2 80.0 - 100.0 fL   MCH 31.7 26.0 - 34.0 pg   MCHC 32.6 30.0 - 36.0 g/dL   RDW 12.2 11.5 - 15.5 %   Platelets 193 150 - 400 K/uL   nRBC 0.0 0.0 - 0.2 %    Comment: Performed at [facility], [place] [address], [place], [place] [address]  Type and screen [place]     Status: None   Collection Time: 01/02/21 12:44 PM  Result Value Ref Range   ABO/RH(D) B POS    Antibody Screen NEG    Sample Expiration      01/05/2021,2359 Performed at [facility], [place] [address] [place], [place] [address]     VAS  US CAROTID  Result Date: 01/01/2021 Carotid Arterial Duplex Study Patient Name:  [name]  Date of Exam:   01/01/2021 Medical Rec #: [id number]        Accession #:    [id number] Date of Birth: 1951-02-17  Patient Gender: M Patient Age:   56Y Exam Location:  [facility] Procedure:      VAS US CAROTID Referring Phys: [id number] --------------------------------------------------------------------------------  Indications:       Patient reports intermittent tingling sensations in the left                    jaw area, across the left chest area at surround the front                    area of the left knee. Symptoms do not always come in all                    areas at the same time. These symptoms have been going on for                    one to two months. He also states he has been having                    headaches for the past week but relates it to a new                    medication he is taking (could not recall the name). 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 Lightheadedness when he has to look                    over the right shoulder while driving. He denies any other                    cerebrovascular symptoms. Risk Factors:      Hyperlipidemia, past history of smoking. Comparison Study:  NA Performing Technologist: Sharlett Iles RVT  Examination Guidelines: A complete evaluation includes B-mode imaging, spectral Doppler, color Doppler, and power Doppler as needed of all accessible portions of each vessel. Bilateral testing is considered an integral part of a complete examination. Limited examinations for reoccurring indications may be performed as noted.  Right Carotid Findings: +----------+--------+--------+--------+------------------+--------+           PSV cm/sEDV cm/sStenosisPlaque DescriptionComments +----------+--------+--------+--------+------------------+--------+ CCA Prox  53       6                                 tortuous +----------+--------+--------+--------+------------------+--------+ CCA Distal57      13                                         +----------+--------+--------+--------+------------------+--------+ ICA Prox  43      10      1-39%   heterogenous               +----------+--------+--------+--------+------------------+--------+ ICA Mid   41      17              heterogenous               +----------+--------+--------+--------+------------------+--------+ ICA Distal35      17                                         +----------+--------+--------+--------+------------------+--------+  ECA       77      10                                         +----------+--------+--------+--------+------------------+--------+ +----------+--------+-------+---------+-------------------+           PSV cm/sEDV cmsDescribe Arm Pressure (mmHG) +----------+--------+-------+---------+-------------------+ [id number]            Turbulent117                 +----------+--------+-------+---------+-------------------+ +---------+--------+--+--------+-+---------------------------+ VertebralPSV cm/s26EDV cm/s6Antegrade and Small caliber +---------+--------+--+--------+-+---------------------------+  Left Carotid Findings: +----------+--------+--------+--------+------------------+--------+           PSV cm/sEDV cm/sStenosisPlaque DescriptionComments +----------+--------+--------+--------+------------------+--------+ CCA Prox  81      14                                         +----------+--------+--------+--------+------------------+--------+ CCA Distal78      15                                         +----------+--------+--------+--------+------------------+--------+ ICA Prox  101     33      1-39%   heterogenous               +----------+--------+--------+--------+------------------+--------+ ICA Mid   49      17                                          +----------+--------+--------+--------+------------------+--------+ ICA Distal52      24                                         +----------+--------+--------+--------+------------------+--------+ ECA       81      10                                         +----------+--------+--------+--------+------------------+--------+ +----------+--------+--------+----------------+-------------------+           PSV cm/sEDV cm/sDescribe        Arm Pressure (mmHG) +----------+--------+--------+----------------+-------------------+ Subclavian160             Multiphasic, GLO756                 +----------+--------+--------+----------------+-------------------+ +---------+--------+--+--------+--+---------+ VertebralPSV cm/s37EDV cm/s13Antegrade +---------+--------+--+--------+--+---------+   Summary: Right Carotid: Velocities in the right ICA are consistent with a 1-39% stenosis. Left Carotid: Velocities in the left ICA are consistent with a 1-39% stenosis. Vertebrals:  Bilateral vertebral arteries demonstrate antegrade flow. Small              caliber right vertebral artery. Subclavians: Right subclavian artery flow was disturbed. Normal flow              hemodynamics were seen in the left subclavian artery. *See table(s) above for measurements and observations.     Preliminary     Review of Systems negative except above denies any abdominal pain or any other GI symptoms prior  to the bleeding coming on Blood pressure 129/85, pulse 65, temperature 97.6 F (36.4 C), temperature source Oral, resp. rate 13, height 5\' 10"  (1.778 m), weight 72.6 kg, SpO2 100 %. Physical Exam vital signs stable afebrile no acute distress lungs clear heart regular rate and rhythm abdomen is soft nontender labs reviewed pertinent for decreased hemoglobin and increased BUN  Assessment/Plan: Probable diverticular bleeding but cannot rule out an upper tract lesion with patient  being on meloxicam Plan: We discussed endoscopy with the patient and his wife which she has had before and will proceed later today just to make sure it is not an upper GI bleeding with further work-up and plans pending those findings  Kirkland E 01/02/2021, 2:19 PM

## 2021-01-02 NOTE — Transfer of Care (Signed)
Immediate Anesthesia Transfer of Care Note  Patient: [name]  Procedure(s) Performed: ESOPHAGOGASTRODUODENOSCOPY (EGD) WITH PROPOFOL  Patient Location: PACU  Anesthesia Type:General  Level of Consciousness: awake, alert  and oriented  Airway & Oxygen Therapy: Patient Spontanous Breathing and Patient connected to face mask oxygen  Post-op Assessment: Report given to RN, Post -op Vital signs reviewed and stable and Patient moving all extremities X 4  Post vital signs: Reviewed and stable  Last Vitals:  Vitals Value Taken Time  BP    Temp    Pulse    Resp    SpO2      Last Pain:  Vitals:   01/02/21 1624  TempSrc: Oral  PainSc: 0-No pain         Complications: No notable events documented.

## 2021-01-02 NOTE — Anesthesia Postprocedure Evaluation (Signed)
Anesthesia Post Note  Patient: [name]  Procedure(s) Performed: ESOPHAGOGASTRODUODENOSCOPY (EGD) WITH PROPOFOL     Patient location during evaluation: PACU Anesthesia Type: MAC Level of consciousness: awake and alert Pain management: pain level controlled Vital Signs Assessment: post-procedure vital signs reviewed and stable Respiratory status: spontaneous breathing, nonlabored ventilation, respiratory function stable and patient connected to nasal cannula oxygen Cardiovascular status: stable and blood pressure returned to baseline Postop Assessment: no apparent nausea or vomiting Anesthetic complications: no   No notable events documented.  Last Vitals:  Vitals:   01/02/21 1600 01/02/21 1624  BP: 140/89 (!) 157/90  Pulse: 66 68  Resp: 17 14  Temp:  36.8 C  SpO2: 100% 100%    Last Pain:  Vitals:   01/02/21 1624  TempSrc: Oral  PainSc: 0-No pain                 [name] S

## 2021-01-02 NOTE — ED Provider Notes (Signed)
[facility] DEPT Provider Note   CSN: [id number] Arrival date & time: 01/02/21  1201     History Chief Complaint  Patient presents with   Rectal Bleeding    [name] is a 70 y.o. male.  Patient presenting with a GI bleed. Patient wentGastroenterology today because he started with black stools.  Then they became maroon.  Now getting to be a little more red.  No vomiting of blood.  No abdominal pain.[facility] patient not on blood thinners.  Patient's blood pressures apparently were low there but upon arrival here blood pressure was 132/85.  Patient received 200 cc normal bolus.  Patient was not tachycardic.  Denies any abdominal pain.  No black bowel movements or prior to just this morning.  Historically however patient had a significant upper GI bleed in the past.  Was due to a bleeding ulcer and required blood transfusion.     Past Medical History:  Diagnosis Date   Allergy    Arthritis    BPH (benign prostatic hyperplasia)    Complication of anesthesia    Pt. stated he had a reaction that ended in him requiring urinary cath placement   Diverticulosis    GERD (gastroesophageal reflux disease)    esophageal spasms   Glaucoma    Gout    Head injury, closed, with concussion    Hepatitis C    chronic - Has been treated with Harvoni   HLD (hyperlipidemia)    statin intolerant (Crestor & Simvastatin) - Taking Livalo 1mg  / week   Hypertension    Parkinson's disease ([place])    Plantar fasciitis    right   PVD (peripheral vascular disease) ([place])    With no claudication; only mild abdominal aortic atherosclerosis noted on ultrasound.   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   Secondary corneal edema of right eye 12/12/2019   Ptosis of right eyelid 12/12/2019   OAB (overactive bladder) 11/29/2019   Bunion of great toe of right foot 07/14/2017   Claw toe, acquired, right 07/14/2017   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   Medication management 10/09/2016   Chest pain at rest 07/04/2015   Plantar fasciitis of right foot 02/19/2015   Depression 08/14/2014   Family history of heart disease in male family member before age 51 04/26/2014   Mild cognitive impairment 03/13/2014   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 Dr. Melvern Banker   cataract surgery Left 11/05/2014   COLONOSCOPY  ESOPHAGOGASTRODUODENOSCOPY N/A 05/02/2015   Procedure: ESOPHAGOGASTRODUODENOSCOPY (EGD);  Surgeon: Ronald Lobo, MD;  Location: [facility];  Service: Endoscopy;  Laterality: N/A;   ESOPHAGOGASTRODUODENOSCOPY  05/02/2015   no source of pt chest pain endoscopically evident. small hiatal hernia.   EYE SURGERY     HARDWARE REMOVAL Right 11/02/2019   Procedure: Second Metatarsal Removal of Deep Implant and Rotational Osteotomy;  Surgeon: Wylene Simmer, MD;  Location: [facility];  Service: Orthopedics;  Laterality: Right;   MEMBRANE PEEL Left  03/14/2014   Procedure: MEMBRANE PEEL; ENDOLASER;  Surgeon: Hurman Horn, MD;  Location: [facility];  Service: Ophthalmology;  Laterality: Left;   NM MYOVIEW LTD  2011   Neg Ischemia or infarct.   NM MYOVIEW LTD  10/2015   LOW RISK. Small, fixed basal lateral defect - likely diaphragmatic attenuation. EF 69%   PARS PLANA VITRECTOMY Left 03/14/2014   Procedure: PARS PLANA VITRECTOMY WITH 25 GAUGE;  Surgeon: Hurman Horn, MD;  Location: [facility];  Service: Ophthalmology;  Laterality: Left;   TONSILLECTOMY     TRANSTHORACIC ECHOCARDIOGRAM  2013   Normal EF. No significant Valve Disease   UPPER GASTROINTESTINAL ENDOSCOPY     WEIL OSTEOTOMY Right 09/01/2017   Procedure: RIGHT GREAT TOE CHEVRON AND WEIL OSTEOTOMY 2ND METATARSAL;  Surgeon: Newt Minion, MD;  Location: [facility];  Service: Orthopedics;  Laterality: Right;       Family History  Problem Relation Age of Onset   Heart disease Mother    Cancer Paternal Grandfather    Cancer Sister     Social History   Tobacco Use   Smoking status: Former    Pack years: 0.00    Types: Cigars    Quit date: 07/27/1988    Years since quitting: 32.4   Smokeless tobacco: Never  Vaping Use   Vaping Use: Never used  Substance Use Topics   Alcohol use: Yes    Alcohol/week: 3.0 standard drinks    Types: 1 Glasses of wine, 1 Cans of beer, 1 Shots of liquor per week    Comment: occasional   Drug use: No    Home Medications Prior to Admission medications   Medication Sig Start Date End Date Taking? Authorizing Provider  acetaminophen (TYLENOL) 500 MG tablet Take 500 mg by mouth every 6 (six) hours as needed for mild pain, fever or headache.   Yes [provider]  alfuzosin (UROXATRAL) 10 MG 24 hr tablet TAKE 1 TABLET BY MOUTH ONCE A DAY Patient taking differently: Take 10 mg by mouth daily. 11/28/20 11/28/21 Yes MacDiarmid, Nicki Reaper, MD  bismuth subsalicylate (PEPTO BISMOL) 262 MG chewable tablet Chew 524 mg by mouth as needed for diarrhea or loose  stools.   Yes [provider]  carbidopa-levodopa (SINEMET IR) 25-100 MG tablet TAKE 1 AND 1/2 TABLETS BY MOUTH THREE TIMES DAILY Patient taking differently: Take 1.5 tablets by mouth 3 (three) times daily. 08/21/20 08/21/21 Yes [facility] Fila, MD  citalopram (CELEXA) 40 MG tablet TAKE 1 TABLET BY MOUTH ONCE A DAY Patient taking differently: Take 20 mg by mouth daily. 09/10/20 09/10/21 Yes Denita Lung, MD  docusate sodium (COLACE) 100 MG capsule Take 200 mg by mouth daily.   Yes [provider]  irbesartan (AVAPRO) 300 MG tablet TAKE 1 TABLET BY MOUTH EVERY MORNING Patient taking differently: Take 300 mg by mouth every morning. 03/14/20 03/14/21 Yes [name], MD  isosorbide mononitrate (IMDUR) 30 MG 24 hr tablet Take 1 tablet (  30 mg total) by mouth daily.  (Take 1/2 hour after taking tylenol) 12/24/20 03/24/21 Yes [name], MD  L-Methylfolate-B12-B6-B2 (CEREFOLIN) 12-25-48-5 MG TABS Take 1 tablet by mouth daily. 10/01/20  Yes [facility] Fila, MD  Multiple Vitamin (MULTIVITAMIN WITH MINERALS) TABS tablet Take 1 tablet by mouth daily.   Yes [provider]  nitroGLYCERIN (NITROSTAT) 0.4 MG SL tablet Place 1 tablet (0.4 mg total) under the tongue every 5 (five) minutes as needed for chest pain. 11/28/20  Yes [name], MD  pantoprazole (PROTONIX) 40 MG tablet Take 40 mg by mouth 2 (two) times daily. 06/17/15  Yes [provider]  simvastatin (ZOCOR) 40 MG tablet TAKE 1 TABLET BY MOUTH AT BEDTIME Patient taking differently: Take 40 mg by mouth at bedtime. 08/22/20 08/22/21 Yes Denita Lung, MD  timolol (TIMOPTIC) 0.5 % ophthalmic solution Place 1 drop into both eyes daily.   Yes [provider]  trihexyphenidyl (ARTANE) 2 MG tablet TAKE 1/2 TABLET BY MOUTH 2 TIMES DAILY WITH A MEAL Patient taking differently: Take 1 mg by mouth 2 (two) times daily with a meal. 10/01/20 10/01/21 Yes Sethi, Lucy Antigua, MD  verapamil (CALAN-SR) 240 MG CR tablet TAKE 1  TABLET BY MOUTH AT BEDTIME. Patient taking differently: Take 240 mg by mouth at bedtime. 02/08/20 02/19/21 Yes [name], MD  VITAMIN D PO Take 1 capsule by mouth daily.   Yes [provider]  meloxicam (MOBIC) 15 MG tablet Take 1 tablet by mouth with a meal once daily for 14 days. Patient not taking: Reported on 01/02/2021 12/19/20     metoprolol tartrate (LOPRESSOR) 50 MG tablet Take 2 tablets (100 mg total) by mouth once for 1 dose. TAKE TWO HOURS PRIOR TO  SCHEDULE CARDIAC TEST 12/24/20 12/25/20  [name], MD  predniSONE (DELTASONE) 10 MG tablet Take 3 tablets by mouth for 3 days, take 2 tablets for 2 days then take 1 tablet for 1 day 12/26/20       Allergies    Patient has no known allergies.  Review of Systems   Review of Systems  Constitutional:  Negative for chills and fever.  HENT:  Negative for ear pain and sore throat.   Eyes:  Negative for pain and visual disturbance.  Respiratory:  Negative for cough and shortness of breath.   Cardiovascular:  Negative for chest pain and palpitations.  Gastrointestinal:  Positive for anal bleeding. Negative for abdominal pain and vomiting.  Genitourinary:  Negative for dysuria and hematuria.  Musculoskeletal:  Negative for arthralgias and back pain.  Skin:  Negative for color change and rash.  Neurological:  Negative for seizures and syncope.  All other systems reviewed and are negative.  Physical Exam Updated Vital Signs BP 133/87   Pulse 72   Temp 97.6 F (36.4 C) (Oral)   Resp 16   Ht 1.778 m (5\' 10" )   Wt 72.6 kg   SpO2 100%   BMI 22.96 kg/m   Physical Exam Vitals and nursing note reviewed.  Constitutional:      General: He is not in acute distress.    Appearance: Normal appearance. He is well-developed.  HENT:     Head: Normocephalic and atraumatic.  Eyes:     Extraocular Movements: Extraocular movements intact.     Conjunctiva/sclera: Conjunctivae normal.     Pupils: Pupils are equal, round, and  reactive to light.  Cardiovascular:     Rate and Rhythm: Normal rate and regular  rhythm.     Heart sounds: No murmur heard. Pulmonary:     Effort: Pulmonary effort is normal. No respiratory distress.     Breath sounds: Normal breath sounds.  Abdominal:     Palpations: Abdomen is soft.     Tenderness: There is no abdominal tenderness.  Genitourinary:    Rectum: Guaiac result positive.  Musculoskeletal:     Cervical back: Neck supple.  Skin:    General: Skin is warm and dry.     Capillary Refill: Capillary refill takes 2 to 3 seconds.  Neurological:     General: No focal deficit present.     Mental Status: He is alert and oriented to person, place, and time.     Cranial Nerves: No cranial nerve deficit.     Sensory: No sensory deficit.     Motor: No weakness.    ED Results / Procedures / Treatments   Labs (all labs ordered are listed, but only abnormal results are displayed) Labs Reviewed  COMPREHENSIVE METABOLIC PANEL - Abnormal; Notable for the following components:      Result Value   Sodium 133 (*)    BUN 47 (*)    Calcium 8.0 (*)    Total Protein 5.3 (*)    Albumin 3.1 (*)    AST 13 (*)    Alkaline Phosphatase 37 (*)    All other components within normal limits  CBC - Abnormal; Notable for the following components:   RBC 2.87 (*)    Hemoglobin 9.1 (*)    HCT 27.9 (*)    All other components within normal limits  RESP PANEL BY RT-PCR (FLU A&B, COVID) ARPGX2  POC OCCULT BLOOD, ED  TYPE AND SCREEN  ABO/RH    EKG None  Radiology VAS US CAROTID  Result Date: 01/02/2021 Carotid Arterial Duplex Study Patient Name:  [name]  Date of Exam:   01/01/2021 Medical Rec #: [id number]        Accession #:    [id number] Date of Birth: 10-20-1950        Patient Gender: M Patient Age:   66Y Exam Location:  [facility] Procedure:      VAS US CAROTID Referring Phys: [id number] DAVID W HARDING --------------------------------------------------------------------------------   Indications:       Patient reports intermittent tingling sensations in the left                    jaw area, across the left chest area at surround the front                    area of the left knee. Symptoms do not always come in all                    areas at the same time. These symptoms have been going on for                    one to two months. He also states he has been having                    headaches for the past week but relates it to a new                    medication he is taking (could not recall the name). 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 Lightheadedness when he has to look                    over the right shoulder while driving. He denies any other                    cerebrovascular symptoms. Risk Factors:      Hyperlipidemia, past history of smoking. Comparison Study:  NA Performing Technologist: Sharlett Iles RVT  Examination Guidelines: A complete evaluation includes B-mode imaging, spectral Doppler, color Doppler, and power Doppler as needed of all accessible portions of each vessel. Bilateral testing is considered an integral part of a complete examination. Limited examinations for reoccurring indications may be performed as noted.  Right Carotid Findings: +----------+--------+--------+--------+------------------+--------+           PSV cm/sEDV cm/sStenosisPlaque DescriptionComments +----------+--------+--------+--------+------------------+--------+ CCA Prox  53      6                                 tortuous +----------+--------+--------+--------+------------------+--------+ CCA Distal57      13                                         +----------+--------+--------+--------+------------------+--------+ ICA Prox  43      10      1-39%   heterogenous               +----------+--------+--------+--------+------------------+--------+ ICA Mid   41      17               heterogenous               +----------+--------+--------+--------+------------------+--------+ ICA Distal35      17                                         +----------+--------+--------+--------+------------------+--------+ ECA       77      10                                         +----------+--------+--------+--------+------------------+--------+ +----------+--------+-------+---------+-------------------+           PSV cm/sEDV cmsDescribe Arm Pressure (mmHG) +----------+--------+-------+---------+-------------------+ [id number]            [id number]                 +----------+--------+-------+---------+-------------------+ +---------+--------+--+--------+-+---------------------------+ VertebralPSV cm/s26EDV cm/s6Antegrade and Small caliber +---------+--------+--+--------+-+---------------------------+  Left Carotid Findings: +----------+--------+--------+--------+------------------+--------+           PSV cm/sEDV cm/sStenosisPlaque DescriptionComments +----------+--------+--------+--------+------------------+--------+ CCA Prox  81      14                                         +----------+--------+--------+--------+------------------+--------+ CCA Distal78      15                                         +----------+--------+--------+--------+------------------+--------+  ICA Prox  101     33      1-39%   heterogenous               +----------+--------+--------+--------+------------------+--------+ ICA Mid   49      17                                         +----------+--------+--------+--------+------------------+--------+ ICA Distal52      24                                         +----------+--------+--------+--------+------------------+--------+ ECA       81      10                                         +----------+--------+--------+--------+------------------+--------+  +----------+--------+--------+----------------+-------------------+           PSV cm/sEDV cm/sDescribe        Arm Pressure (mmHG) +----------+--------+--------+----------------+-------------------+ Subclavian160             Multiphasic, SAY301                 +----------+--------+--------+----------------+-------------------+ +---------+--------+--+--------+--+---------+ VertebralPSV cm/s37EDV cm/s13Antegrade +---------+--------+--+--------+--+---------+   Summary: Right Carotid: Velocities in the right ICA are consistent with a 1-39% stenosis. Left Carotid: Velocities in the left ICA are consistent with a 1-39% stenosis. Vertebrals:  Bilateral vertebral arteries demonstrate antegrade flow. Small              caliber right vertebral artery. Subclavians: Right subclavian artery flow was disturbed. Normal flow              hemodynamics were seen in the left subclavian artery. *See table(s) above for measurements and observations.  Electronically signed by Larae Grooms MD on 01/02/2021 at 2:23:23 PM.    Final     Procedures Procedures   CRITICAL CARE Performed by: Fredia Sorrow Total critical care time: 45 minutes Critical care time was exclusive of separately billable procedures and treating other patients. Critical care was necessary to treat or prevent imminent or life-threatening deterioration. Critical care was time spent personally by me on the following activities: development of treatment plan with patient and/or surrogate as well as nursing, discussions with consultants, evaluation of patient's response to treatment, examination of patient, obtaining history from patient or surrogate, ordering and performing treatments and interventions, ordering and review of laboratory studies, ordering and review of radiographic studies, pulse oximetry and re-evaluation of patient's condition.   Medications Ordered in ED Medications - No data to display  ED Course  I have reviewed  the triage vital signs and the nursing notes.  Pertinent labs & imaging results that were available during my care of the patient were reviewed by me and considered in my medical decision making (see chart for details).    MDM Rules/Calculators/A&P                          Patient with acute onset black stools and quickly became more maroon stools.  And some red blood in the stools.  Was at the GI doctors office they noted that blood pressure was low.  Patient not on blood thinners  was given 200 cc normal saline with improvement.  Blood pressure upon arrival here was 132/84.  Current blood pressure is 123/94.  Patient not on blood thinners.  Patient without abdominal pain.  Patient seen by Dr. Watt Climes from [facility].  They are planning to do an upper endoscopy.  Patient will require admission.  Patient also not tachycardic currently.  Patient's hemoglobin here today is 9.1.  And is down significantly from where it was in February of this year.  Patient's past medical history significant for significant upper GI bleed in the past where he had a bleeding ulcer.  Required blood transfusion at that time.  Patient was consuming a lot nonsteroidals at that time.  Patient states only Tylenol now but occasionally takes Motrin.  Could be the culprit.   Final Clinical Impression(s) / ED Diagnoses Final diagnoses:  Acute GI bleeding    Rx / DC Orders ED Discharge Orders     None        Fredia Sorrow, MD 01/02/21 1514

## 2021-01-02 NOTE — ED Triage Notes (Addendum)
Pt reports dark black stools since this morning. Went to GI doctor who sent him in for evaluation. As per EMS pts BP was low at the scene, given 200 ml NS with improvement. No use of blood thinners. Hx gi bleed.

## 2021-01-02 NOTE — Progress Notes (Signed)
PHARMACIST - PHYSICIAN ORDER COMMUNICATION  CONCERNING: P&T Medication Policy on Herbal Medications  DESCRIPTION:  This patient's order for:  Cerefolin  has been noted.  This product(s) is classified as an "herbal" or natural product. Due to a lack of definitive safety studies or FDA approval, nonstandard manufacturing practices, plus the potential risk of unknown drug-drug interactions while on inpatient medications, the Pharmacy and Therapeutics Committee does not permit the use of "herbal" or natural products of this type within [facility].   ACTION TAKEN: The pharmacy department is unable to verify this order at this time and your patient has been informed of this safety policy. Please reevaluate patient's clinical condition at discharge and address if the herbal or natural product(s) should be resumed at that time.  We do not stock combination product   Minda Ditto PharmD 01/02/2021, 6:21 PM

## 2021-01-02 NOTE — Op Note (Signed)
[facility] Patient Name: [name] Procedure Date: 01/02/2021 MRN: [id number] Attending MD: Clarene Essex , MD Date of Birth: 01-Mar-1951 CSN: [id number] Age: 70 Admit Type: Inpatient Procedure:                Upper GI endoscopy Indications:              Acute post hemorrhagic anemia, Hematochezia, Melena Providers:                Clarene Essex, MD, Fransico Setters Mbumina, Technician Referring MD:              Medicines:                Propofol total dose 517 mg IV Complications:            No immediate complications. Estimated Blood Loss:     Estimated blood loss: none. Procedure:                Pre-Anesthesia Assessment:                           - Prior to the procedure, a History and Physical                            was performed, and patient medications and                            allergies were reviewed. The patient's tolerance of                            previous anesthesia was also reviewed. The risks                            and benefits of the procedure and the sedation                            options and risks were discussed with the patient.                            All questions were answered, and informed consent                            was obtained. Prior Anticoagulants: The patient has                            taken no previous anticoagulant or antiplatelet                            agents except for NSAID medication. ASA Grade                            Assessment: II - A patient with mild systemic                            disease. After reviewing the risks and benefits,  the patient was deemed in satisfactory condition to                            undergo the procedure.                           After obtaining informed consent, the endoscope was                            passed under direct vision. Throughout the                            procedure, the patient's blood pressure, pulse, and                             oxygen saturations were monitored continuously. The                            GIF-H190 ([id number]) was introduced through the                            mouth, and advanced to the third part of duodenum.                            The upper GI endoscopy was accomplished without                            difficulty. The patient tolerated the procedure                            well. Scope In: Scope Out: Findings:      A small hiatal hernia was present.      Patchy minimal inflammation characterized by erythema was found on the       greater curvature of the stomach, on the lesser curvature of the stomach       and in the gastric antrum.      A small non-bleeding diverticulum was found in the third portion of the       duodenum.      The duodenal bulb, first portion of the duodenum and second portion of       the duodenum were normal.      The exam was otherwise without abnormality. Impression:               - Small hiatal hernia.                           - Chronic gastritis.                           - Non-bleeding duodenal diverticulum.                           - Normal duodenal bulb, first portion of the                            duodenum and second  portion of the duodenum.                           - The examination was otherwise normal. And no                            blood seen                           - No specimens collected. Moderate Sedation:      Not Applicable - Patient had care per Anesthesia. Recommendation:           - Clear liquid diet today. Treat for diverticular                            bleeding and if bleeding increases get nuclear                            bleeding scan and if positive consult IR for                            possible angiogram and coils and follow H&H                           - Continue present medications.                           - Return to GI clinic PRN. And please call us if                            any question or  problem otherwise we will check on                            tomorrow                           - Telephone GI clinic if symptomatic PRN. Procedure Code(s):        --- Professional ---                           [phone], Esophagogastroduodenoscopy, flexible,                            transoral; diagnostic, including collection of                            specimen(s) by brushing or washing, when performed                            (separate procedure) Diagnosis Code(s):        --- Professional ---                           K44.9, Diaphragmatic hernia without obstruction or  gangrene                           K29.50, Unspecified chronic gastritis without                            bleeding                           D62, Acute posthemorrhagic anemia                           K92.1, Melena (includes Hematochezia)                           K57.10, Diverticulosis of small intestine without                            perforation or abscess without bleeding CPT copyright 2019 American [employer] Association. All rights reserved. The codes documented in this report are preliminary and upon coder review may  be revised to meet current compliance requirements. Clarene Essex, MD 01/02/2021 5:15:09 PM This report has been signed electronically. Number of Addenda: 0

## 2021-01-02 NOTE — Anesthesia Procedure Notes (Signed)
Procedure Name: MAC Date/Time: 01/02/2021 4:51 PM Performed by: Niel Hummer, CRNA Pre-anesthesia Checklist: Patient identified, Emergency Drugs available, Suction available and Patient being monitored Oxygen Delivery Method: Simple face mask

## 2021-01-02 NOTE — Anesthesia Preprocedure Evaluation (Addendum)
Anesthesia Evaluation  Patient identified by MRN, date of birth, ID band Patient awake    Reviewed: Allergy & Precautions, NPO status , Patient's Chart, lab work & pertinent test results  Airway Mallampati: II  TM Distance: >3 FB Neck ROM: Full    Dental no notable dental hx.    Pulmonary neg pulmonary ROS, former smoker,    Pulmonary exam normal breath sounds clear to auscultation       Cardiovascular hypertension, Pt. on medications and Pt. on home beta blockers + Peripheral Vascular Disease  Normal cardiovascular exam Rhythm:Regular Rate:Normal     Neuro/Psych Depression Parkinson's  Neuromuscular disease negative psych ROS   GI/Hepatic GERD  Medicated,(+) Hepatitis -, CGIB   Endo/Other  negative endocrine ROS  Renal/GU negative Renal ROS   BPH negative genitourinary   Musculoskeletal negative musculoskeletal ROS (+) Arthritis ,   Abdominal   Peds negative pediatric ROS (+)  Hematology negative hematology ROS (+)   Anesthesia Other Findings   Reproductive/Obstetrics negative OB ROS                            Anesthesia Physical Anesthesia Plan  ASA: 3  Anesthesia Plan: MAC   Post-op Pain Management:    Induction: Intravenous  PONV Risk Score and Plan: 1 and Propofol infusion  Airway Management Planned: Simple Face Mask, Natural Airway and Nasal Cannula  Additional Equipment: None  Intra-op Plan:   Post-operative Plan:   Informed Consent:   Plan Discussed with:   Anesthesia Plan Comments: (Lab Results      Component                Value               Date                      WBC                      8.2                 01/02/2021                HGB                      9.1 (L)             01/02/2021                HCT                      27.9 (L)            01/02/2021                MCV                      97.2                01/02/2021                PLT                       193                 01/02/2021           Lab Results      Component  Value               Date                      NA                       133 (L)             01/02/2021                K                        4.0                 01/02/2021                CO2                      24                  01/02/2021                GLUCOSE                  95                  01/02/2021                BUN                      47 (H)              01/02/2021                CREATININE               1.18                01/02/2021                CALCIUM                  8.0 (L)             01/02/2021                GFRNONAA                 >60                 01/02/2021                GFRAA                    68                  09/10/2020          )        Anesthesia Quick Evaluation

## 2021-01-02 NOTE — ED Notes (Signed)
Completed IP handoff via secure chat to Valentino Hue, RN

## 2021-01-03 ENCOUNTER — Encounter (HOSPITAL_COMMUNITY): Payer: Self-pay | Admitting: Gastroenterology

## 2021-01-03 DIAGNOSIS — K922 Gastrointestinal hemorrhage, unspecified: Secondary | ICD-10-CM | POA: Diagnosis present

## 2021-01-03 LAB — CBC
HCT: 30.3 % — ABNORMAL LOW (ref 39.0–52.0)
Hemoglobin: 9.9 g/dL — ABNORMAL LOW (ref 13.0–17.0)
MCH: 31.4 pg (ref 26.0–34.0)
MCHC: 32.7 g/dL (ref 30.0–36.0)
MCV: 96.2 fL (ref 80.0–100.0)
Platelets: 213 10*3/uL (ref 150–400)
RBC: 3.15 MIL/uL — ABNORMAL LOW (ref 4.22–5.81)
RDW: 12.3 % (ref 11.5–15.5)
WBC: 7.5 10*3/uL (ref 4.0–10.5)
nRBC: 0 % (ref 0.0–0.2)

## 2021-01-03 LAB — BASIC METABOLIC PANEL
Anion gap: 5 (ref 5–15)
BUN: 32 mg/dL — ABNORMAL HIGH (ref 8–23)
CO2: 26 mmol/L (ref 22–32)
Calcium: 8.5 mg/dL — ABNORMAL LOW (ref 8.9–10.3)
Chloride: 104 mmol/L (ref 98–111)
Creatinine, Ser: 0.94 mg/dL (ref 0.61–1.24)
GFR, Estimated: >60 mL/min (ref >60)
Glucose, Bld: 77 mg/dL (ref 70–99)
Potassium: 4.1 mmol/L (ref 3.5–5.1)
Sodium: 135 mmol/L (ref 135–145)

## 2021-01-03 MED ORDER — NITROGLYCERIN 0.4 MG SL SUBL
0.4000 mg | SUBLINGUAL_TABLET | SUBLINGUAL | Status: DC | PRN
  Administered 2021-01-03 – 2021-01-13 (×9): 0.4 mg via SUBLINGUAL
  Filled 2021-01-03 (×4): qty 1

## 2021-01-03 MED ORDER — NITROGLYCERIN 0.4 MG SL SUBL: 0.4000 mg | SUBLINGUAL_TABLET | Freq: Once | SUBLINGUAL | Status: AC

## 2021-01-03 MED ORDER — NITROGLYCERIN 0.4 MG SL SUBL
SUBLINGUAL_TABLET | SUBLINGUAL | Status: AC
  Administered 2021-01-03: 0.4 mg
  Filled 2021-01-03: qty 1

## 2021-01-03 MED ORDER — POLYETHYLENE GLYCOL 3350 17 G PO PACK
17.0000 g | PACK | Freq: Every day | ORAL | Status: DC
  Administered 2021-01-05 – 2021-01-07 (×2): 17 g via ORAL
  Filled 2021-01-03 (×2): qty 1

## 2021-01-03 NOTE — Progress Notes (Signed)
PROGRESS NOTE    [name]  ZWC:[id number] DOB: 03-09-1951 DOA: 01/02/2021 PCP: Denita Lung, MD  Outpatient Specialists:   Brief Narrative:  As per H&P done on admission: "[name] is a 70 y.o. male with medical history significant of Parkinson disease, BPH, HTN, HLD, GI bleed presented with melena.   Patient was constipated for last 2 days, and this morning all of a sudden he passed large amount black tarry stool, then 1 hour later he started to pass maroon-colored stool and started to feel lightheaded.  Patient has had worsening of constipation and congestion and has been taking meloxicam for the last 2 weeks.  He denies any chest pains, fall or loss of consciousness.   This morning, patient went to see his GI who sent him to ED.   ED Course: Vital signs stable, no hypotension no tachycardia, hemoglobin 9.0 compared to baseline > 14".  01/03/2021: Patient seen alongside patient's wife.  GI input is appreciated.  Patient underwent EGD.    EGD revealed: - Small hiatal hernia. - Chronic gastritis. - Non-bleeding duodenal diverticulum. - Normal duodenal bulb, first portion of the duodenum and second portion of the duodenum. - The examination was otherwise normal. And no blood seen - No specimens collected.  We will continue to monitor H/H as per GI.  GI team is directing care.  Assessment & Plan:   Active Problems:   Acute GI bleeding   GI bleed -EGD is nonrevealing.   -GI team is directing care.   -Continue to monitor H/H. -Avoid constipation. -Will defer the decision to proceed with colonoscopy to the GI team.  Acute blood loss anemia -No significant drop in H/H overnight. -However, patient's hemoglobin was around 14 g/dL about 4 months ago.  On presentation, patient's hemoglobin was around 9 g/dL.   HTN -Stable.  Last systolic blood pressure was 102 mmHg.  Parkinson disease -Continue Sinemet   DVT prophylaxis: SCD. Code Status: Full code Family  Communication: Wife Disposition Plan: Home eventually   Consultants:  Gastroenterology.  Procedures:  EGD.  Antimicrobials:  None.   Subjective: Patient continues to report rectal bleed (mild, dark stool) No chest pain No shortness of breath  Objective: Vitals:   01/03/21 0009 01/03/21 0438 01/03/21 0754 01/03/21 1340  BP: (!) 141/92 (!) 158/97 (!) 152/95 102/61  Pulse: 61 63 76 71  Resp: 18 18 18 16   Temp: 97.9 F (36.6 C) 97.7 F (36.5 C) 98 F (36.7 C) 97.9 F (36.6 C)  TempSrc: Oral Oral Oral Oral  SpO2: 100% 99% 100% 98%  Weight:      Height:        Intake/Output Summary (Last 24 hours) at 01/03/2021 1557 Last data filed at 01/02/2021 2241 Gross per 24 hour  Intake 200 ml  Output 600 ml  Net -400 ml   Filed Weights   01/02/21 1213 01/02/21 2017  Weight: 72.6 kg 69.6 kg    Examination:  General exam: Appears calm and comfortable.  Patient is pale. Respiratory system: Clear to auscultation.  Cardiovascular system: S1 & S2 heard, Gastrointestinal system: Abdomen is nondistended, soft and nontender. No organomegaly or masses felt. Normal bowel sounds heard. Central nervous system: Alert and oriented.  Patient moves all extremities.  Tremors. Extremities: No leg edema.  Data Reviewed: I have personally reviewed following labs and imaging studies  CBC: Recent Labs  Lab 01/02/21 1238 01/02/21 2245 01/03/21 0412  WBC 8.2  --  7.5  HGB 9.1* 9.8* 9.9*  HCT 27.9* 30.3* 30.3*  MCV 97.2  --  96.2  PLT 193  --  810   Basic Metabolic Panel: Recent Labs  Lab 01/02/21 1238 01/03/21 0412  NA 133* 135  K 4.0 4.1  CL 104 104  CO2 24 26  GLUCOSE 95 77  BUN 47* 32*  CREATININE 1.18 0.94  CALCIUM 8.0* 8.5*   GFR: Estimated Creatinine Clearance: 71 mL/min (by C-G formula based on SCr of 0.94 mg/dL). Liver Function Tests: Recent Labs  Lab 01/02/21 1238  AST 13*  ALT 8  ALKPHOS 37*  BILITOT 0.5  PROT 5.3*  ALBUMIN 3.1*   No results for  input(s): LIPASE, AMYLASE in the last 168 hours. No results for input(s): AMMONIA in the last 168 hours. Coagulation Profile: No results for input(s): INR, PROTIME in the last 168 hours. Cardiac Enzymes: No results for input(s): CKTOTAL, CKMB, CKMBINDEX, TROPONINI in the last 168 hours. BNP (last 3 results) No results for input(s): PROBNP in the last 8760 hours. HbA1C: No results for input(s): HGBA1C in the last 72 hours. CBG: No results for input(s): GLUCAP in the last 168 hours. Lipid Profile: No results for input(s): CHOL, HDL, LDLCALC, TRIG, CHOLHDL, LDLDIRECT in the last 72 hours. Thyroid Function Tests: No results for input(s): TSH, T4TOTAL, FREET4, T3FREE, THYROIDAB in the last 72 hours. Anemia Panel: No results for input(s): VITAMINB12, FOLATE, FERRITIN, TIBC, IRON, RETICCTPCT in the last 72 hours. Urine analysis:    Component Value Date/Time   COLORURINE YELLOW 04/18/2018 1019   APPEARANCEUR CLEAR 04/18/2018 1019   LABSPEC 1.010 01/17/2020 1502   PHURINE 7.0 04/18/2018 1019   GLUCOSEU NEGATIVE 04/18/2018 1019   HGBUR NEGATIVE 04/18/2018 1019   BILIRUBINUR negative 01/17/2020 1502   KETONESUR negative 01/17/2020 1502   KETONESUR NEGATIVE 04/18/2018 1019   PROTEINUR negative 01/17/2020 1502   PROTEINUR NEGATIVE 04/18/2018 1019   UROBILINOGEN 0.2 03/14/2014 2107   NITRITE Negative 01/17/2020 1502   NITRITE NEGATIVE 04/18/2018 1019   LEUKOCYTESUR Negative 01/17/2020 1502   Sepsis Labs: @LABRCNTIP (procalcitonin:4,lacticidven:4)  ) Recent Results (from the past 240 hour(s))  Resp Panel by RT-PCR (Flu A&B, Covid) Nasopharyngeal Swab     Status: None   Collection Time: 01/02/21  3:45 PM   Specimen: Nasopharyngeal Swab; Nasopharyngeal(NP) swabs in vial transport medium  Result Value Ref Range Status   SARS Coronavirus 2 by RT PCR NEGATIVE NEGATIVE Final    Comment: (NOTE) SARS-CoV-2 target nucleic acids are NOT DETECTED.  The SARS-CoV-2 RNA is generally detectable in  upper respiratory specimens during the acute phase of infection. The lowest concentration of SARS-CoV-2 viral copies this assay can detect is 138 copies/mL. A negative result does not preclude SARS-Cov-2 infection and should not be used as the sole basis for treatment or other patient management decisions. A negative result may occur with  improper specimen collection/handling, submission of specimen other than nasopharyngeal swab, presence of viral mutation(s) within the areas targeted by this assay, and inadequate number of viral copies(<138 copies/mL). A negative result must be combined with clinical observations, patient history, and epidemiological information. The expected result is Negative.  Fact Sheet for Patients:  EntrepreneurPulse.com.au  Fact Sheet for Healthcare Providers:  IncredibleEmployment.be  This test is no t yet approved or cleared by the [place] FDA and  has been authorized for detection and/or diagnosis of SARS-CoV-2 by FDA under an Emergency Use Authorization (EUA). This EUA will remain  in effect (meaning this test can be used) for the duration of the COVID-19  declaration under Section 564(b)(1) of the Act, 21 U.S.C.section 360bbb-3(b)(1), unless the authorization is terminated  or revoked sooner.       Influenza A by PCR NEGATIVE NEGATIVE Final   Influenza B by PCR NEGATIVE NEGATIVE Final    Comment: (NOTE) The Xpert Xpress SARS-CoV-2/FLU/RSV plus assay is intended as an aid in the diagnosis of influenza from Nasopharyngeal swab specimens and should not be used as a sole basis for treatment. Nasal washings and aspirates are unacceptable for Xpert Xpress SARS-CoV-2/FLU/RSV testing.  Fact Sheet for Patients: EntrepreneurPulse.com.au  Fact Sheet for Healthcare Providers: IncredibleEmployment.be  This test is not yet approved or cleared by the [place] FDA and has been  authorized for detection and/or diagnosis of SARS-CoV-2 by FDA under an Emergency Use Authorization (EUA). This EUA will remain in effect (meaning this test can be used) for the duration of the COVID-19 declaration under Section 564(b)(1) of the Act, 21 U.S.C. section 360bbb-3(b)(1), unless the authorization is terminated or revoked.  Performed at [facility], [place] [address], [place], [place] [address]          Radiology Studies: OCT, Retina - [place] - Both Eyes  Result Date: 01/02/2021 Right Eye Quality was good. Scan locations included subfoveal. Central Foveal Thickness: 205. Findings include outer retinal atrophy, central retinal atrophy. Left Eye Quality was good. Scan locations included subfoveal. Central Foveal Thickness: 381. Progression has been stable. Notes Diffuse macular atrophy right eye no interval change Mild diffuse macular thickening left eye with no active CME.  No change in foveal elevation over the last 2 years. Minor CME superonasal, but this is old and involutional and unchanged for years with no encroachment to center of the fovea  Color Fundus Photography Optos - OU - Both Eyes  Result Date: 01/02/2021 Right Eye Progression has been stable. Notes OD with cloudy media secondary to corneal opacity, striae from corneal edema Retina attached, optic nerve atrophy good PRP peripheral OS, mild temporal optic atrophy. 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, no active macular edema       Scheduled Meds:  alfuzosin  10 mg Oral Daily   carbidopa-levodopa  1.5 tablet Oral TID   citalopram  20 mg Oral Daily   docusate sodium  200 mg Oral Daily   multivitamin with minerals  1 tablet Oral Daily   pantoprazole (PROTONIX) IV  40 mg Intravenous Q12H   polyethylene glycol  17 g Oral Daily   simvastatin  40 mg Oral QHS   timolol  1 drop Both Eyes Daily   trihexyphenidyl  1 mg Oral BID WC   Continuous Infusions:   LOS: 1 day    Time spent: 25  minutes    Dana Allan, MD  [facility] Pager #: [phone] 7PM-7AM contact night coverage as above

## 2021-01-03 NOTE — Progress Notes (Signed)
Alert and oriented x4. Denies pain or discomfort. Admitted from ED. Oriented to room and call bell. Pt independent with adls. On clear liquid diet. Will continue plan of care

## 2021-01-03 NOTE — Progress Notes (Signed)
   01/03/21 2122  Vitals  BP 108/73  MAP (mmHg) 84  BP Method Automatic  Pulse Rate 75  Pulse Rate Source Monitor  MEWS COLOR  MEWS Score Color Green  Oxygen Therapy  SpO2 100 %  MEWS Score  MEWS Temp 0  MEWS Systolic 0  MEWS Pulse 0  MEWS RR 0  MEWS LOC 0  MEWS Score 0  Nitro given for chest pain. MD notified

## 2021-01-03 NOTE — Care Management CC44 (Signed)
Condition Code 44 Documentation Completed  Patient Details  Name: [name] MRN: [id number] Date of Birth: 1951/06/16   Condition Code 44 given:  Yes Patient signature on Condition Code 44 notice:  Yes Documentation of 2 MD's agreement:  Yes Code 44 added to claim:  Yes    Purcell Mouton, RN 01/03/2021, 4:29 PM

## 2021-01-03 NOTE — Progress Notes (Signed)
[name]  [name] 70 y.o. 1950/09/10  CC: GI bleed, anemia   Subjective: Had endoscopy yesterday that was negative with Dr. Watt Climes. Patient for last several weeks has been having very large hard stools.  Denies abdominal pain or rectal pain.  Has been taking Colace.  Day before yesterday had very large bowel movement that was painful.  Next bowel movement was followed with bright red blood. Has not had a bowel movement since he has been here.  BUN is improving from 47-32, hemoglobin improving from 9.1 to 9.9 without any fluid.  Patient has history of esophageal spasm outpatient, takes nitroglycerin as needed.  Has followed with cardiology and was cleared.  Warm liquid helps.  Denies dysphagia patient does have a history of Parkinson's.  Esophageal spasm going on since 2016.  Barium swallow 10/21 slight esophageal dysmotility.  Nitroglycerin tablets as needed. Last colonoscopy 07/2013 showing diverticula in sigmoid colon otherwise normal repeat 10 years.  EGD 01/03/2021 - Small hiatal hernia. - Chronic gastritis. - Non-bleeding duodenal diverticulum. - Normal duodenal bulb, first portion of the duodenum and second portion of the duodenum. - The examination was otherwise normal. And no blood seen - No specimens collected.   ROS : Review of Systems  Constitutional:  Negative for chills and fever.  HENT:  Negative for hearing loss.   Respiratory:  Negative for shortness of breath.   Cardiovascular:  Negative for chest pain and leg swelling.  Gastrointestinal:  Positive for constipation. Negative for abdominal pain, blood in stool, diarrhea, heartburn, melena, nausea and vomiting.       Complaining of esophageal spasm after taking pills today  Musculoskeletal:  Negative for falls.  Skin:  Negative for itching.  Neurological:  Positive for tremors.  Psychiatric/Behavioral:  Negative for memory loss.      Objective: Vital signs in last 24  hours: Vitals:   01/03/21 0438 01/03/21 0754  BP: (!) 158/97 (!) 152/95  Pulse: 63 76  Resp: 18 18  Temp: 97.7 F (36.5 C) 98 F (36.7 C)  SpO2: 99% 100%    Physical Exam: Physical Exam   Lab Results: Recent Labs    01/02/21 1238 01/03/21 0412  NA 133* 135  K 4.0 4.1  CL 104 104  CO2 24 26  GLUCOSE 95 77  BUN 47* 32*  CREATININE 1.18 0.94  CALCIUM 8.0* 8.5*   Recent Labs    01/02/21 1238  AST 13*  ALT 8  ALKPHOS 37*  BILITOT 0.5  PROT 5.3*  ALBUMIN 3.1*   Recent Labs    01/02/21 1238 01/02/21 2245 01/03/21 0412  WBC 8.2  --  7.5  HGB 9.1* 9.8* 9.9*  HCT 27.9* 30.3* 30.3*  MCV 97.2  --  96.2  PLT 193  --  213   No results for input(s): LABPROT, INR in the last 72 hours.  Lab Results: Results for orders placed or performed during the hospital encounter of 01/02/21 (from the past 48 hour(s))  Comprehensive metabolic panel     Status: Abnormal   Collection Time: 01/02/21 12:38 PM  Result Value Ref Range   Sodium 133 (L) 135 - 145 mmol/L   Potassium 4.0 3.5 - 5.1 mmol/L   Chloride 104 98 - 111 mmol/L   CO2 24 22 - 32 mmol/L   Glucose, Bld 95 70 - 99 mg/dL    Comment: Glucose reference range applies only to samples taken after fasting for at least 8 hours.   BUN 47 (  H) 8 - 23 mg/dL   Creatinine, Ser 1.18 0.61 - 1.24 mg/dL   Calcium 8.0 (L) 8.9 - 10.3 mg/dL   Total Protein 5.3 (L) 6.5 - 8.1 g/dL   Albumin 3.1 (L) 3.5 - 5.0 g/dL   AST 13 (L) 15 - 41 U/L   ALT 8 0 - 44 U/L   Alkaline Phosphatase 37 (L) 38 - 126 U/L   Total Bilirubin 0.5 0.3 - 1.2 mg/dL   GFR, Estimated >60 >60 mL/min    Comment: (Maxwell) Calculated using the CKD-EPI Creatinine Equation (2021)    Anion gap 5 5 - 15    Comment: Performed at [facility]: [place], [place] [address] [place], [place] [address]  CBC     Status: Abnormal   Collection Time: 01/02/21 12:38 PM  Result Value Ref Range   WBC 8.2 4.0 - 10.5 K/uL   RBC 2.87 (L) 4.22 - 5.81 MIL/uL   Hemoglobin 9.1 (L)  13.0 - 17.0 g/dL   HCT 27.9 (L) 39.0 - 52.0 %   MCV 97.2 80.0 - 100.0 fL   MCH 31.7 26.0 - 34.0 pg   MCHC 32.6 30.0 - 36.0 g/dL   RDW 12.2 11.5 - 15.5 %   Platelets 193 150 - 400 K/uL   nRBC 0.0 0.0 - 0.2 %    Comment: Performed at [facility], [place] [address] [place], [place] [address]  ABO/Rh     Status: None   Collection Time: 01/02/21 12:38 PM  Result Value Ref Range   ABO/RH(D)      B POS Performed at [facility], [place] [address], [place], [place] [address]   Type and screen [place]     Status: None   Collection Time: 01/02/21 12:44 PM  Result Value Ref Range   ABO/RH(D) B POS    Antibody Screen NEG    Sample Expiration      01/05/2021,2359 Performed at [facility], [place] [address], [place], [place] [address]   Resp Panel by RT-PCR (Flu A&B, Covid) Nasopharyngeal Swab     Status: None   Collection Time: 01/02/21  3:45 PM   Specimen: Nasopharyngeal Swab; Nasopharyngeal(NP) swabs in vial transport medium  Result Value Ref Range   SARS Coronavirus 2 by RT PCR NEGATIVE NEGATIVE    Comment: (Maxwell) SARS-CoV-2 target nucleic acids are NOT DETECTED.  The SARS-CoV-2 RNA is generally detectable in upper respiratory specimens during the acute phase of infection. The lowest concentration of SARS-CoV-2 viral copies this assay can detect is 138 copies/mL. A negative result does not preclude SARS-Cov-2 infection and should not be used as the sole basis for treatment or other patient management decisions. A negative result may occur with  improper specimen collection/handling, submission of specimen other than nasopharyngeal swab, presence of viral mutation(s) within the areas targeted by this assay, and inadequate number of viral copies(<138 copies/mL). A negative result must be combined with clinical observations, patient history, and epidemiological information. The expected result is  Negative.  Fact Sheet for Patients:  EntrepreneurPulse.com.au  Fact Sheet for Healthcare Providers:  IncredibleEmployment.be  This test is no t yet approved or cleared by the [place] FDA and  has been authorized for detection and/or diagnosis of SARS-CoV-2 by FDA under an Emergency Use Authorization (EUA). This EUA will remain  in effect (meaning this test can be used) for the duration of the COVID-19 declaration under Section 564(b)(1) of the Act, 21 U.S.C.section 360bbb-3(b)(1),  unless the authorization is terminated  or revoked sooner.       Influenza A by PCR NEGATIVE NEGATIVE   Influenza B by PCR NEGATIVE NEGATIVE    Comment: (Maxwell) The Xpert Xpress SARS-CoV-2/FLU/RSV plus assay is intended as an aid in the diagnosis of influenza from Nasopharyngeal swab specimens and should not be used as a sole basis for treatment. Nasal washings and aspirates are unacceptable for Xpert Xpress SARS-CoV-2/FLU/RSV testing.  Fact Sheet for Patients: EntrepreneurPulse.com.au  Fact Sheet for Healthcare Providers: IncredibleEmployment.be  This test is not yet approved or cleared by the [place] FDA and has been authorized for detection and/or diagnosis of SARS-CoV-2 by FDA under an Emergency Use Authorization (EUA). This EUA will remain in effect (meaning this test can be used) for the duration of the COVID-19 declaration under Section 564(b)(1) of the Act, 21 U.S.C. section 360bbb-3(b)(1), unless the authorization is terminated or revoked.  Performed at [facility], [place] [address], [place], [place] [address]   Hemoglobin and hematocrit, blood     Status: Abnormal   Collection Time: 01/02/21 10:45 PM  Result Value Ref Range   Hemoglobin 9.8 (L) 13.0 - 17.0 g/dL   HCT 30.3 (L) 39.0 - 52.0 %    Comment: Performed at [facility], [place] [address], [place], [place]  [address]  CBC     Status: Abnormal   Collection Time: 01/03/21  4:12 AM  Result Value Ref Range   WBC 7.5 4.0 - 10.5 K/uL   RBC 3.15 (L) 4.22 - 5.81 MIL/uL   Hemoglobin 9.9 (L) 13.0 - 17.0 g/dL   HCT 30.3 (L) 39.0 - 52.0 %   MCV 96.2 80.0 - 100.0 fL   MCH 31.4 26.0 - 34.0 pg   MCHC 32.7 30.0 - 36.0 g/dL   RDW 12.3 11.5 - 15.5 %   Platelets 213 150 - 400 K/uL   nRBC 0.0 0.0 - 0.2 %    Comment: Performed at [facility], [place] [address], [place], [place] [address]  Basic metabolic panel     Status: Abnormal   Collection Time: 01/03/21  4:12 AM  Result Value Ref Range   Sodium 135 135 - 145 mmol/L   Potassium 4.1 3.5 - 5.1 mmol/L   Chloride 104 98 - 111 mmol/L   CO2 26 22 - 32 mmol/L   Glucose, Bld 77 70 - 99 mg/dL    Comment: Glucose reference range applies only to samples taken after fasting for at least 8 hours.   BUN 32 (H) 8 - 23 mg/dL   Creatinine, Ser 0.94 0.61 - 1.24 mg/dL   Calcium 8.5 (L) 8.9 - 10.3 mg/dL   GFR, Estimated >60 >60 mL/min    Comment: (Maxwell) Calculated using the CKD-EPI Creatinine Equation (2021)    Anion gap 5 5 - 15    Comment: Performed at [facility], [place] [address], [place], [place] [address]    Studies/Results: OCT, Retina - OU - Both Eyes  Result Date: 01/02/2021 Right Eye Quality was good. Scan locations included subfoveal. Central Foveal Thickness: 205. Findings include outer retinal atrophy, central retinal atrophy. Left Eye Quality was good. Scan locations included subfoveal. Central Foveal Thickness: 381. Progression has been stable. Notes Diffuse macular atrophy right eye no interval change Mild diffuse macular thickening left eye with no active CME.  No change in foveal elevation over the last 2 years. Minor CME superonasal, but this is old and involutional and  unchanged for years with no encroachment to center of the fovea  Color Fundus Photography Optos - OU - Both Eyes  Result Date: 01/02/2021 Right Eye  Progression has been stable. Notes OD with cloudy media secondary to corneal opacity, striae from corneal edema Retina attached, optic nerve atrophy good PRP peripheral OS, mild temporal optic atrophy. 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, no active macular edema  VAS US CAROTID  Result Date: 01/02/2021 Carotid Arterial Duplex Study Patient Name:  [name]  Date of Exam:   01/01/2021 Medical Rec #: [id number]        Accession #:    [id number] Date of Birth: 1951/03/12        Patient Gender: M Patient Age:   33Y Exam Location:  [facility] Procedure:      VAS US CAROTID Referring Phys: [id number] DAVID W HARDING --------------------------------------------------------------------------------  Indications:       Patient reports intermittent tingling sensations in the left                    jaw area, across the left chest area at surround the front                    area of the left knee. Symptoms do not always come in all                    areas at the same time. These symptoms have been going on for                    one to two months. He also states he has been having                    headaches for the past week but relates it to a new                    medication he is taking (could not recall the name). 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 Lightheadedness when he has to look                    over the right shoulder while driving. He denies any other                    cerebrovascular symptoms. Risk Factors:      Hyperlipidemia, past history of smoking. Comparison Study:  NA Performing Technologist: Sharlett Iles RVT  Examination Guidelines: A complete evaluation includes B-mode imaging, spectral Doppler, color Doppler, and power Doppler as needed of all accessible portions of each vessel. Bilateral testing is considered an integral part of a complete examination. Limited examinations for reoccurring indications may be  performed as noted.  Right Carotid Findings: +----------+--------+--------+--------+------------------+--------+           PSV cm/sEDV cm/sStenosisPlaque DescriptionComments +----------+--------+--------+--------+------------------+--------+ CCA Prox  53      6                                 tortuous +----------+--------+--------+--------+------------------+--------+ CCA Distal57      13                                         +----------+--------+--------+--------+------------------+--------+  ICA Prox  43      10      1-39%   heterogenous               +----------+--------+--------+--------+------------------+--------+ ICA Mid   41      17              heterogenous               +----------+--------+--------+--------+------------------+--------+ ICA Distal35      17                                         +----------+--------+--------+--------+------------------+--------+ ECA       77      10                                         +----------+--------+--------+--------+------------------+--------+ +----------+--------+-------+---------+-------------------+           PSV cm/sEDV cmsDescribe Arm Pressure (mmHG) +----------+--------+-------+---------+-------------------+ [id number]            Turbulent117                 +----------+--------+-------+---------+-------------------+ +---------+--------+--+--------+-+---------------------------+ VertebralPSV cm/s26EDV cm/s6Antegrade and Small caliber +---------+--------+--+--------+-+---------------------------+  Left Carotid Findings: +----------+--------+--------+--------+------------------+--------+           PSV cm/sEDV cm/sStenosisPlaque DescriptionComments +----------+--------+--------+--------+------------------+--------+ CCA Prox  81      14                                         +----------+--------+--------+--------+------------------+--------+ CCA Distal78      15                                          +----------+--------+--------+--------+------------------+--------+ ICA Prox  101     33      1-39%   heterogenous               +----------+--------+--------+--------+------------------+--------+ ICA Mid   49      17                                         +----------+--------+--------+--------+------------------+--------+ ICA Distal52      24                                         +----------+--------+--------+--------+------------------+--------+ ECA       81      10                                         +----------+--------+--------+--------+------------------+--------+ +----------+--------+--------+----------------+-------------------+           PSV cm/sEDV cm/sDescribe        Arm Pressure (mmHG) +----------+--------+--------+----------------+-------------------+ Subclavian160             Multiphasic, OBS962                 +----------+--------+--------+----------------+-------------------+ +---------+--------+--+--------+--+---------+  VertebralPSV cm/s37EDV cm/s13Antegrade +---------+--------+--+--------+--+---------+   Summary: Right Carotid: Velocities in the right ICA are consistent with a 1-39% stenosis. Left Carotid: Velocities in the left ICA are consistent with a 1-39% stenosis. Vertebrals:  Bilateral vertebral arteries demonstrate antegrade flow. Small              caliber right vertebral artery. Subclavians: Right subclavian artery flow was disturbed. Normal flow              hemodynamics were seen in the left subclavian artery. *See table(s) above for measurements and observations.  Electronically signed by Larae Grooms MD on 01/02/2021 at 2:23:23 PM.    Final     Assessment/Plan: GI bleed, anemia Negative EGD, improving HGB and BUN.  Advance diet to soft diet Monitor H/H, continue supportive care, likely home later today or tomorrow Likely diverticular bleed with recent  constipation  Constipation Continue colace, add on miralax Add fiber outpatient  Esophageal spasm Follow up out patient, continue PPI   Vladimir Crofts PA-C 01/03/2021, 9:47 AM  Contact #  [phone]

## 2021-01-03 NOTE — Telephone Encounter (Signed)
Patient is currently admitted

## 2021-01-03 NOTE — Care Management Obs Status (Signed)
[facility] NOTIFICATION   Patient Details  Name: HABIB KISE MRN: [id number] Date of Birth: [birth year]   Medicare Observation Status Notification Given:  Yes    Purcell Mouton, RN 01/03/2021, 4:29 PM

## 2021-01-03 NOTE — TOC Progression Note (Signed)
Transition of Care [facility]) - Progression Note    Patient Details  Name: [name] MRN: [id number] Date of Birth: 01-11-1951  [facility] CM/SW Contact  Purcell Mouton, RN Phone Number: 01/03/2021, 4:46 PM  Clinical Narrative:   Pt is home with wife who is an Therapist, sports. There are no discharge needs at present time.    Expected Discharge Plan: Home/Self Care Barriers to Discharge: No Barriers Identified  Expected Discharge Plan and Services Expected Discharge Plan: Home/Self Care                                               Social Determinants of Health (SDOH) Interventions    Readmission Risk Interventions No flowsheet data found.

## 2021-01-04 ENCOUNTER — Encounter (HOSPITAL_COMMUNITY): Payer: Self-pay | Admitting: Internal Medicine

## 2021-01-04 ENCOUNTER — Observation Stay (HOSPITAL_COMMUNITY): Payer: Medicare Other

## 2021-01-04 DIAGNOSIS — K2950 Unspecified chronic gastritis without bleeding: Secondary | ICD-10-CM | POA: Diagnosis present

## 2021-01-04 DIAGNOSIS — I201 Angina pectoris with documented spasm: Secondary | ICD-10-CM | POA: Diagnosis not present

## 2021-01-04 DIAGNOSIS — I9589 Other hypotension: Secondary | ICD-10-CM

## 2021-01-04 DIAGNOSIS — R55 Syncope and collapse: Secondary | ICD-10-CM

## 2021-01-04 DIAGNOSIS — E785 Hyperlipidemia, unspecified: Secondary | ICD-10-CM | POA: Diagnosis present

## 2021-01-04 DIAGNOSIS — M7989 Other specified soft tissue disorders: Secondary | ICD-10-CM | POA: Diagnosis not present

## 2021-01-04 DIAGNOSIS — R778 Other specified abnormalities of plasma proteins: Secondary | ICD-10-CM | POA: Diagnosis not present

## 2021-01-04 DIAGNOSIS — J9811 Atelectasis: Secondary | ICD-10-CM | POA: Diagnosis not present

## 2021-01-04 DIAGNOSIS — K648 Other hemorrhoids: Secondary | ICD-10-CM | POA: Diagnosis present

## 2021-01-04 DIAGNOSIS — K224 Dyskinesia of esophagus: Secondary | ICD-10-CM | POA: Diagnosis present

## 2021-01-04 DIAGNOSIS — K449 Diaphragmatic hernia without obstruction or gangrene: Secondary | ICD-10-CM | POA: Diagnosis present

## 2021-01-04 DIAGNOSIS — N400 Enlarged prostate without lower urinary tract symptoms: Secondary | ICD-10-CM | POA: Diagnosis present

## 2021-01-04 DIAGNOSIS — R911 Solitary pulmonary nodule: Secondary | ICD-10-CM | POA: Diagnosis not present

## 2021-01-04 DIAGNOSIS — K922 Gastrointestinal hemorrhage, unspecified: Secondary | ICD-10-CM | POA: Diagnosis present

## 2021-01-04 DIAGNOSIS — Z20822 Contact with and (suspected) exposure to covid-19: Secondary | ICD-10-CM | POA: Diagnosis present

## 2021-01-04 DIAGNOSIS — K219 Gastro-esophageal reflux disease without esophagitis: Secondary | ICD-10-CM | POA: Diagnosis present

## 2021-01-04 DIAGNOSIS — I10 Essential (primary) hypertension: Secondary | ICD-10-CM | POA: Diagnosis present

## 2021-01-04 DIAGNOSIS — I951 Orthostatic hypotension: Secondary | ICD-10-CM | POA: Diagnosis not present

## 2021-01-04 DIAGNOSIS — G9340 Encephalopathy, unspecified: Secondary | ICD-10-CM | POA: Diagnosis not present

## 2021-01-04 DIAGNOSIS — E861 Hypovolemia: Secondary | ICD-10-CM

## 2021-01-04 DIAGNOSIS — D62 Acute posthemorrhagic anemia: Secondary | ICD-10-CM | POA: Diagnosis present

## 2021-01-04 DIAGNOSIS — I739 Peripheral vascular disease, unspecified: Secondary | ICD-10-CM | POA: Diagnosis present

## 2021-01-04 DIAGNOSIS — Z781 Physical restraint status: Secondary | ICD-10-CM | POA: Diagnosis not present

## 2021-01-04 DIAGNOSIS — R9431 Abnormal electrocardiogram [ECG] [EKG]: Secondary | ICD-10-CM | POA: Diagnosis not present

## 2021-01-04 DIAGNOSIS — R079 Chest pain, unspecified: Secondary | ICD-10-CM | POA: Diagnosis not present

## 2021-01-04 DIAGNOSIS — K5900 Constipation, unspecified: Secondary | ICD-10-CM | POA: Diagnosis present

## 2021-01-04 DIAGNOSIS — R5381 Other malaise: Secondary | ICD-10-CM | POA: Diagnosis present

## 2021-01-04 DIAGNOSIS — R4182 Altered mental status, unspecified: Secondary | ICD-10-CM | POA: Diagnosis not present

## 2021-01-04 DIAGNOSIS — F05 Delirium due to known physiological condition: Secondary | ICD-10-CM | POA: Diagnosis not present

## 2021-01-04 DIAGNOSIS — I248 Other forms of acute ischemic heart disease: Secondary | ICD-10-CM | POA: Diagnosis present

## 2021-01-04 DIAGNOSIS — R451 Restlessness and agitation: Secondary | ICD-10-CM | POA: Diagnosis not present

## 2021-01-04 DIAGNOSIS — G20 Parkinson's disease: Secondary | ICD-10-CM | POA: Diagnosis present

## 2021-01-04 DIAGNOSIS — K921 Melena: Secondary | ICD-10-CM | POA: Diagnosis not present

## 2021-01-04 DIAGNOSIS — Z79899 Other long term (current) drug therapy: Secondary | ICD-10-CM | POA: Diagnosis not present

## 2021-01-04 DIAGNOSIS — K5731 Diverticulosis of large intestine without perforation or abscess with bleeding: Secondary | ICD-10-CM | POA: Diagnosis present

## 2021-01-04 DIAGNOSIS — Z87891 Personal history of nicotine dependence: Secondary | ICD-10-CM | POA: Diagnosis not present

## 2021-01-04 HISTORY — PX: IR ANGIOGRAM SELECTIVE EACH ADDITIONAL VESSEL: IMG667

## 2021-01-04 HISTORY — PX: IR US GUIDE VASC ACCESS RIGHT: IMG2390

## 2021-01-04 HISTORY — PX: IR ANGIOGRAM VISCERAL SELECTIVE: IMG657

## 2021-01-04 LAB — BASIC METABOLIC PANEL
Anion gap: 6 (ref 5–15)
Anion gap: 7 (ref 5–15)
BUN: 33 mg/dL — ABNORMAL HIGH (ref 8–23)
BUN: 35 mg/dL — ABNORMAL HIGH (ref 8–23)
CO2: 24 mmol/L (ref 22–32)
CO2: 23 mmol/L (ref 22–32)
Calcium: 8.2 mg/dL — ABNORMAL LOW (ref 8.9–10.3)
Calcium: 8.1 mg/dL — ABNORMAL LOW (ref 8.9–10.3)
Chloride: 103 mmol/L (ref 98–111)
Chloride: 105 mmol/L (ref 98–111)
Creatinine, Ser: 1.06 mg/dL (ref 0.61–1.24)
Creatinine, Ser: 1.17 mg/dL (ref 0.61–1.24)
GFR, Estimated: >60 mL/min (ref >60)
GFR, Estimated: >60 mL/min (ref >60)
Glucose, Bld: 119 mg/dL — ABNORMAL HIGH (ref 70–99)
Glucose, Bld: 136 mg/dL — ABNORMAL HIGH (ref 70–99)
Potassium: 3.9 mmol/L (ref 3.5–5.1)
Potassium: 4 mmol/L (ref 3.5–5.1)
Sodium: 133 mmol/L — ABNORMAL LOW (ref 135–145)
Sodium: 135 mmol/L (ref 135–145)

## 2021-01-04 LAB — CBC WITH DIFFERENTIAL/PLATELET
Abs Immature Granulocytes: 0.06 10*3/uL (ref 0.00–0.07)
Basophils Absolute: 0 10*3/uL (ref 0.0–0.1)
Basophils Relative: 0 %
Eosinophils Absolute: 0.1 10*3/uL (ref 0.0–0.5)
Eosinophils Relative: 1 %
HCT: 24 % — ABNORMAL LOW (ref 39.0–52.0)
Hemoglobin: 7.9 g/dL — ABNORMAL LOW (ref 13.0–17.0)
Immature Granulocytes: 1 %
Lymphocytes Relative: 17 %
Lymphs Abs: 1.6 10*3/uL (ref 0.7–4.0)
MCH: 31.3 pg (ref 26.0–34.0)
MCHC: 32.9 g/dL (ref 30.0–36.0)
MCV: 95.2 fL (ref 80.0–100.0)
Monocytes Absolute: 1.3 10*3/uL — ABNORMAL HIGH (ref 0.1–1.0)
Monocytes Relative: 14 %
Neutro Abs: 6.1 10*3/uL (ref 1.7–7.7)
Neutrophils Relative %: 67 %
Platelets: 221 10*3/uL (ref 150–400)
RBC: 2.52 MIL/uL — ABNORMAL LOW (ref 4.22–5.81)
RDW: 12.2 % (ref 11.5–15.5)
WBC: 9.2 10*3/uL (ref 4.0–10.5)
nRBC: 0 % (ref 0.0–0.2)

## 2021-01-04 LAB — CBC
HCT: 26.8 % — ABNORMAL LOW (ref 39.0–52.0)
HCT: 28 % — ABNORMAL LOW (ref 39.0–52.0)
Hemoglobin: 8.1 g/dL — ABNORMAL LOW (ref 13.0–17.0)
Hemoglobin: 9.5 g/dL — ABNORMAL LOW (ref 13.0–17.0)
MCH: 32 pg (ref 26.0–34.0)
MCH: 32.1 pg (ref 26.0–34.0)
MCHC: 30.2 g/dL (ref 30.0–36.0)
MCHC: 33.9 g/dL (ref 30.0–36.0)
MCV: 105.9 fL — ABNORMAL HIGH (ref 80.0–100.0)
MCV: 94.6 fL (ref 80.0–100.0)
Platelets: 202 10*3/uL (ref 150–400)
Platelets: 236 10*3/uL (ref 150–400)
RBC: 2.53 MIL/uL — ABNORMAL LOW (ref 4.22–5.81)
RBC: 2.96 MIL/uL — ABNORMAL LOW (ref 4.22–5.81)
RDW: 12.2 % (ref 11.5–15.5)
RDW: 12.6 % (ref 11.5–15.5)
WBC: 6.1 10*3/uL (ref 4.0–10.5)
WBC: 9.8 10*3/uL (ref 4.0–10.5)
nRBC: 0 % (ref 0.0–0.2)
nRBC: 0.2 % (ref 0.0–0.2)

## 2021-01-04 LAB — MRSA PCR SCREENING: MRSA by PCR: NEGATIVE

## 2021-01-04 LAB — TROPONIN I (HIGH SENSITIVITY)
Troponin I (High Sensitivity): 3 ng/L (ref <18)
Troponin I (High Sensitivity): 12 ng/L (ref <18)
Troponin I (High Sensitivity): 14 ng/L (ref <18)
Troponin I (High Sensitivity): 20 ng/L — ABNORMAL HIGH (ref <18)
Troponin I (High Sensitivity): 34 ng/L — ABNORMAL HIGH (ref <18)
Troponin I (High Sensitivity): 46 ng/L — ABNORMAL HIGH (ref <18)

## 2021-01-04 MED ORDER — ISOSORBIDE MONONITRATE ER 30 MG PO TB24
30.0000 mg | ORAL_TABLET | Freq: Every day | ORAL | Status: DC
  Administered 2021-01-05 – 2021-01-09 (×5): 30 mg via ORAL
  Filled 2021-01-04 (×5): qty 1

## 2021-01-04 MED ORDER — SODIUM CHLORIDE 0.9 % IV BOLUS
500.0000 mL | Freq: Once | INTRAVENOUS | Status: AC
  Administered 2021-01-04: 500 mL via INTRAVENOUS

## 2021-01-04 MED ORDER — MIDAZOLAM HCL 2 MG/2ML IJ SOLN
INTRAMUSCULAR | Status: AC | PRN
  Administered 2021-01-04: 1 mg via INTRAVENOUS

## 2021-01-04 MED ORDER — IOHEXOL 300 MG/ML  SOLN
50.0000 mL | Freq: Once | INTRAMUSCULAR | Status: AC | PRN
  Administered 2021-01-04: 40 mL via INTRA_ARTERIAL

## 2021-01-04 MED ORDER — SODIUM CHLORIDE 0.9 % IV BOLUS: 500.0000 mL | Freq: Once | INTRAVENOUS | Status: DC

## 2021-01-04 MED ORDER — CHLORHEXIDINE GLUCONATE CLOTH 2 % EX PADS
6.0000 | MEDICATED_PAD | Freq: Every day | CUTANEOUS | Status: DC
  Administered 2021-01-05 – 2021-01-15 (×10): 6 via TOPICAL

## 2021-01-04 MED ORDER — MORPHINE SULFATE (PF) 4 MG/ML IV SOLN
4.0000 mg | Freq: Once | INTRAVENOUS | Status: AC
  Administered 2021-01-04: 4 mg via INTRAVENOUS
  Filled 2021-01-04: qty 1

## 2021-01-04 MED ORDER — NITROGLYCERIN 0.1 MG/HR TD PT24
0.1000 mg | MEDICATED_PATCH | Freq: Every day | TRANSDERMAL | Status: AC
  Administered 2021-01-04: 0.1 mg via TRANSDERMAL
  Filled 2021-01-04 (×2): qty 1

## 2021-01-04 MED ORDER — ORAL CARE MOUTH RINSE
15.0000 mL | Freq: Two times a day (BID) | OROMUCOSAL | Status: DC
  Administered 2021-01-05 – 2021-01-16 (×22): 15 mL via OROMUCOSAL

## 2021-01-04 MED ORDER — SODIUM CHLORIDE (PF) 0.9 % IJ SOLN
INTRAMUSCULAR | Status: AC
  Filled 2021-01-04: qty 50

## 2021-01-04 MED ORDER — IRBESARTAN 300 MG PO TABS
300.0000 mg | ORAL_TABLET | Freq: Every day | ORAL | Status: DC
  Administered 2021-01-04: 300 mg via ORAL
  Filled 2021-01-04 (×3): qty 1

## 2021-01-04 MED ORDER — HYDRALAZINE HCL 20 MG/ML IJ SOLN: 10.0000 mg | Freq: Once | INTRAMUSCULAR | Status: DC

## 2021-01-04 MED ORDER — LIDOCAINE HCL 1 % IJ SOLN
INTRAMUSCULAR | Status: AC
  Filled 2021-01-04: qty 20

## 2021-01-04 MED ORDER — MIDAZOLAM HCL 2 MG/2ML IJ SOLN
INTRAMUSCULAR | Status: AC
  Filled 2021-01-04: qty 2

## 2021-01-04 MED ORDER — IOHEXOL 300 MG/ML  SOLN
100.0000 mL | Freq: Once | INTRAMUSCULAR | Status: AC | PRN
  Administered 2021-01-04: 40 mL via INTRA_ARTERIAL

## 2021-01-04 MED ORDER — METOPROLOL TARTRATE 50 MG PO TABS
50.0000 mg | ORAL_TABLET | Freq: Two times a day (BID) | ORAL | Status: DC
  Administered 2021-01-04 – 2021-01-09 (×6): 50 mg via ORAL
  Filled 2021-01-04 (×7): qty 2

## 2021-01-04 MED ORDER — FENTANYL CITRATE (PF) 100 MCG/2ML IJ SOLN
INTRAMUSCULAR | Status: AC
  Filled 2021-01-04: qty 2

## 2021-01-04 MED ORDER — FENTANYL CITRATE (PF) 100 MCG/2ML IJ SOLN
INTRAMUSCULAR | Status: AC | PRN
  Administered 2021-01-04: 25 ug via INTRAVENOUS

## 2021-01-04 MED ORDER — IOHEXOL 350 MG/ML SOLN
100.0000 mL | Freq: Once | INTRAVENOUS | Status: AC | PRN
  Administered 2021-01-04: 100 mL via INTRAVENOUS

## 2021-01-04 NOTE — Progress Notes (Signed)
Rapid Response Event Note   Reason for Call : PT HAVING CP, V FIB? ON MONITOR   Initial Focused Assessment:  ARRIVED TO ROOM PT NOW DENYING CP.  ALERT AND ABLE TO FOLLOW COMMANDS. O X3 (NOT SURE OF PLACE).  EKG SHOWS SR NOT V FIB.  PT DENYING CP. VSS  INTACT SEE FLOWSHEET.  PT HGB 9.5 FROM 9.9 HOWEVER PT HAD A LARGE BLOODY BM JUST RECENTLY AND A FOLLOW UP HGB MAY BE NEEDED.  PT HAS ALREADY RECEIVED 4 NITRO SL AND MSO4 X 1 DURING THE NIGHT.   Interventions: PT PLACED ON  MONITOR SEE VS.  EKG DONE    Plan of Care: PT WILL REMAIN IN CURRENT LOCATION PER PRIMARY RN TO FOLLOW UP.  PRIMARY RN TO FOLLOW UP WITH [facility] REGARDING LARGE BLOODY BM.  PT CONTINUES TO DENY CP.    Event Summary:   MD Notified: NO Call Time: 6387 Arrival Time: 5643 End Time: 0720  Dyann Ruddle, RN

## 2021-01-04 NOTE — Progress Notes (Signed)
Pt received total 4 doses the course of the shift for epigastric medial chest pain. With ratings from 4-6/10. Pt was able to get relief after each dose of nitro. Pt received a 517ml bolus. After the 4th dose Md ordered morphine IVP. Pain was relieved, pressures normalized after each dose of meds and after bolus. At around 0620 patient had BM with maroon and bright red blood. Pt was assisted back to bed where he sounded disorientated. RRT RN was called and made aware of situation. Genell, RN came to assess patient. Tele called noting a rhythm change. EKG was done. Showed NSR on tele. Endorsed to oncoming RN to request that MD order more frequent H/H draws. Pt stable at this time. Aox4. Care endorsed.

## 2021-01-04 NOTE — Progress Notes (Signed)
Patient noted to have intermittent confusion. Per IR orders, RN notified radiologist of changes. RN to continue to monitor for increased mental status changes

## 2021-01-04 NOTE — Progress Notes (Signed)
RRT Rn called

## 2021-01-04 NOTE — Progress Notes (Signed)
Angiogram unsuccessful because no active bleeding at time of procedure, therefore embolization not performed.  Patient's vital signs are stable.  He has had 1 medium, dark red stool since the procedure (per nurse).  Patient is on low-dose MiraLAX to help purge stool out of colon.  Will check CBC every 12 hours.  Cleotis Nipper, M.D. Pager [phone] If no answer or after 5 PM call [phone]

## 2021-01-04 NOTE — Progress Notes (Signed)
   01/04/21 0346  Vitals  BP 115/76  MAP (mmHg) 89  BP Location Right Arm  BP Method Automatic  Patient Position (if appropriate) Lying  Pulse Rate 66  Pulse Rate Source Monitor  ECG Heart Rate 65  Resp 18  Level of Consciousness  Level of Consciousness Alert  MEWS COLOR  MEWS Score Color Green  Oxygen Therapy  SpO2 100 %  O2 Device Nasal Cannula  O2 Flow Rate (L/min) 2 L/min  Pain Assessment  Pain Scale 0-10  Pain Score 0  MEWS Score  MEWS Temp 0  MEWS Systolic 0  MEWS Pulse 0  MEWS RR 0  MEWS LOC 0  MEWS Score 0  Vitals before Morphine was administered.

## 2021-01-04 NOTE — Significant Event (Signed)
Rapid Response Event Note   Reason for Call :  Code Blue called overhead LOC  Initial Focused Assessment:  Pt alert, oriented and resting in bed. No signs of distress. Pt had gotten up to bedside commode with staff and passed bright red bloody BM and went pale per bedside nurse. Reported that he felt he was going to pass out and then experienced loss of consciousness. Code Blue called overhead. No loss of pulse or respirations. Pt assisted back to bed. Rapid response to bedside.   BP 88/60 (69) HR 87 NSR O2 98% 4L [place] RR 20     Interventions:  500cc NS bolus, STAT CBC, Troponin, BMP ordered. STAT CTA abd/pelvis ordered and carried out. GI symptoms discussed with GI, Dr. Cristina Gong.  Pt has experienced chest pain throughout the evening and received several doses of nitro and morphine. Pt states that this  is not new and at home usually a warm glass of water resolves the issue. Pt had experienced chest pain prior to event but not during the rapid response.  Plan of Care:  CTA preformed, BMP, Troponin, CBC, and EKG completed. Hgb down to 7.9 from 9.5 8 hours prior. Recommended to bedside nurse to ask about transfusion given symptoms an active bleeding. BP 157/89 at end of rapid response. Pt alert and oriented. VSS stable. Last BM prior to transport to CTA. Wife [name] updated at bedside.  Event Summary:   MD Notified: Marthenia Rolling and [place] Call Time: 0821 Arrival Time: 0822 End Time: [place], RN

## 2021-01-04 NOTE — Progress Notes (Signed)
Patient with complaints of intermittent chest pain throughout shift. Nitro patch applied on right arm. MD notified of intermittent chest pain throughout shift. Two EKGs completed for possible ST changes- both placed on patient chart and MD aware. No chest pain noted at this time.   BP remaining 190s-200s. RN notified MD. One time order for Hydralazine IV 10 mg placed.

## 2021-01-04 NOTE — Sedation Documentation (Signed)
5 Fr Exoseal deployed right groin

## 2021-01-04 NOTE — Sedation Documentation (Signed)
End tidal monitoring off at this point due to inaccurate waveforms. Patients respiratory status intact, 10-12 RR per minute and sats 100% on 3l via [place]

## 2021-01-04 NOTE — Progress Notes (Addendum)
0730-Rounding on  for the first time, pt asked to go to bathroom. Pt up BSC and returned to bed, upon returning to bed, pt states, "I feel like I am going out and leaving" Pt eyes begin to roll back and briefing became unresponsive, CODE called, pt started to come around and speak, asking his location, pt disoriented, then begin to slowing realize what is happening, BP 88/60 90 20 97% on 4 liters then decreased to 2 liters and O2 sat remain 97%. Wife notified and Dr. Marthenia Rolling and Dr. Cristina Gong made aware. Dr. Marcie Bal called at [place] and Dr. Cristina Gong notified 0800 while RR- RN, CN, [facility]  and other team to assist pt. CT- angio ordered stat. Pt talking and aware. 0900 Pt by now has had a totally of 2 bloody BM with clots. Wife has arrived and at bedside with pt and is updated on current status. Pt on bedside monitor and will cont to monitor pt closely and update MD for changes. SRP, RN

## 2021-01-04 NOTE — Procedures (Signed)
Interventional Radiology Procedure Note  Procedure: US guided RCFA access, closure with Exoseal.  Mesenteric angiogram.   Findings:  No overt extravasation of contrast that localized to the site ID'd on the CTA. We did not treat with embolization. .  Complications: None  Recommendations:  - Right hip straight x 4 hours - log roll for bedpan - routine wound care - Anticipate further hematochezia as the previous blood clears.  - would allow interval of 24-48 hours of observation before any further decision for repeat angio/intervention, unless becomes hemodynamically unstable l - Do not submerge for 7 days - Agree with serial H&H and currentl level of care - Discussed with Dr. Cristina Gong.  - VIR to follow   Signed,  Dulcy Fanny. Earleen Newport, DO

## 2021-01-04 NOTE — Plan of Care (Signed)
  Problem: Education: Goal: Knowledge of General Education information will improve Description: Including pain rating scale, medication(s)/side effects and non-pharmacologic comfort measures Outcome: Progressing   Problem: Safety: Goal: Ability to remain free from injury will improve Outcome: Progressing   

## 2021-01-04 NOTE — Progress Notes (Addendum)
Recurrent bleeding this morning.  Multiple bloody bowel movements, culminating in hypotension and syncope, with drop in hemoglobin from 9.5 to 7.9 overnight (and that latter hemoglobin is prehydration, so his "real" hemoglobin is probably lower).  Patient actually had "CODE BLUE" called because of syncope and transient unresponsiveness but he never actually lost a heart rhythm.  CT angio indicates bleeding from diverticular source in the proximal colon.  (The patient's most recent colonoscopy was 7 years ago and did note diverticula in the sigmoid region but did not at that time detect, or report, diverticular change in the region of the hepatic flexure as is evidenced on current CT scan.)  At this time, the patient is resting in bed, alert and in no distress.  Skin is warm, radial pulse is relatively full, and vital signs are completely normal approximately 2 hours since his active bleeding.  Abdomen is nontender.  Have reviewed patient's office record, and discussed case with both diagnostic and interventional radiologist, as well as with the floor nurse.  Labs reviewed.  CT report reviewed.  Impression:  1.  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  Posthemorrhagic anemia  3.  Remote history of upper GI bleed 18 years ago due to aspirin induced ulcer, requiring 6 units of packed cells at that time  4.  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  CT shows no evidence of hepatic nodularity, varices, or splenomegaly or ascites.  Recommendation:  1.  Completely agree with plan for interventional radiology angiography with embolization if the bleeding vessel can be identified  2.  Discussed condition, prognosis, and management with patient and wife at bedside  3.  Would  consider transfusion of 1 unit of packed cells because of patient's uncertain prognosis with respect to further bleeding and the fact that his current hemoglobin of 7.9 is probably not completely equilibrated and therefore is probably higher than the patient's "true" hemoglobin  4.  As I explained to the patient and his wife, unfortunately there is not a good endoscopic management for this kind of problem, necessitating interventional radiology or, potentially ultimately surgery.  Cleotis Nipper, M.D. Pager [phone] If no answer or after 5 PM call [phone]

## 2021-01-04 NOTE — Consult Note (Signed)
Chief Complaint: Lower GI bleeding  Referring Physician(s): Dr. Annette Stable GI  Supervising Physician: Corrie Mckusick  Patient Status: [facility] - In-pt  History of Present Illness: [name] is a 70 y.o. male presenting [facility] from his GI office on Thursday June 9, with new hematochezia and syncope.    I met [name] and his wife in his room this am.   They tell me that he was at the office on Thursday with complaints of new bloody BM's as of Thursday morning, and while in the office nearly fainted.  He was transported to the hospital for further workup.   He has a history, about 10 years ago his wife says, of previous upper GI bleeding, possibly from ulcer disease.  This event is new.    He has not had any hematemesis on this occasion.  He denies any abdominal pain.    He did, this morning, have an episode of syncope in his room where a code blue was initiated, with his vital signs documenting a BP of 88/60, HR <100, and 97% O2.    This prompted a CTA, diagnostics which I had discussed with Dr. Cristina Gong as an appropriate test in this scenario.  This CTA was performed and was positive for extravasation of contrast at the proximal right colon.    Currently he is in his bed resting with SBP of 147 on cuff pressure.  HR of 80's.      Past Medical History:  Diagnosis Date   Allergy    Arthritis    BPH (benign prostatic hyperplasia)    Complication of anesthesia    Pt. stated he had a reaction that ended in him requiring urinary cath placement   Diverticulosis    GERD (gastroesophageal reflux disease)    esophageal spasms   Glaucoma    Gout    Head injury, closed, with concussion    Hepatitis C    chronic - Has been treated with Harvoni   HLD (hyperlipidemia)    statin intolerant (Crestor & Simvastatin) - Taking Livalo 30m / week   Hypertension    Parkinson's disease ([place]    Plantar fasciitis    right   PVD (peripheral vascular disease) ([place]    With no  claudication; only mild abdominal aortic atherosclerosis noted on ultrasound.   Thoracic ascending aortic aneurysm (HCC)    4.2 cm ascending TAA 09/2016 CT, 1 yr f/u rec   Ulcer     Past Surgical History:  Procedure Laterality Date   BUNIONECTOMY WITH WEIL OSTEOTOMY Right 11/02/2019   Procedure: Right Foot Lapidus, Modified McBride Bunionectomy,  Hallux Akin Osteotomy;  Surgeon: HWylene Simmer MD;  [facility]  Service: Orthopedics;  Laterality: Right;   CARDIAC CATHETERIZATION  2005   30% Cx. Dr. GMelvern Banker  cataract surgery Left 11/05/2014   COLONOSCOPY     ESOPHAGOGASTRODUODENOSCOPY N/A 05/02/2015   Procedure: ESOPHAGOGASTRODUODENOSCOPY (EGD);  Surgeon: RRonald Lobo MD;  [facility];  Service: Endoscopy;  Laterality: N/A;   ESOPHAGOGASTRODUODENOSCOPY  05/02/2015   no source of pt chest pain endoscopically evident. small hiatal hernia.   ESOPHAGOGASTRODUODENOSCOPY (EGD) WITH PROPOFOL N/A 01/02/2021   Procedure: ESOPHAGOGASTRODUODENOSCOPY (EGD) WITH PROPOFOL;  Surgeon: MClarene Essex MD;  [facility];  Service: Endoscopy;  Laterality: N/A;   EYE SURGERY     HARDWARE REMOVAL Right 11/02/2019   Procedure: Second Metatarsal Removal of Deep Implant and Rotational Osteotomy;  Surgeon: HWylene Simmer MD;  Location: [facility];  Service: Orthopedics;  Laterality: Right;   MEMBRANE PEEL Left 03/14/2014   Procedure: MEMBRANE PEEL; ENDOLASER;  Surgeon: Hurman Horn, MD;  Location: [place];  Service: Ophthalmology;  Laterality: Left;   NM MYOVIEW LTD  2011   Neg Ischemia or infarct.   NM MYOVIEW LTD  10/2015   LOW RISK. Small, fixed basal lateral defect - likely diaphragmatic attenuation. EF 69%   PARS PLANA VITRECTOMY Left 03/14/2014   Procedure: PARS PLANA VITRECTOMY WITH 25 GAUGE;  Surgeon: Hurman Horn, MD;  Location: [place];  Service: Ophthalmology;  Laterality: Left;   TONSILLECTOMY     TRANSTHORACIC ECHOCARDIOGRAM  2013   Normal EF. No  significant Valve Disease   UPPER GASTROINTESTINAL ENDOSCOPY     WEIL OSTEOTOMY Right 09/01/2017   Procedure: RIGHT GREAT TOE CHEVRON AND WEIL OSTEOTOMY 2ND METATARSAL;  Surgeon: Newt Minion, MD;  Location: [place];  Service: Orthopedics;  Laterality: Right;    Allergies: Patient has no known allergies.  Medications: Prior to Admission medications   Medication Sig Start Date End Date Taking? Authorizing Provider  acetaminophen (TYLENOL) 500 MG tablet Take 500 mg by mouth every 6 (six) hours as needed for mild pain, fever or headache.   Yes [provider]  alfuzosin (UROXATRAL) 10 MG 24 hr tablet TAKE 1 TABLET BY MOUTH ONCE A DAY Patient taking differently: Take 10 mg by mouth daily. 11/28/20 11/28/21 Yes MacDiarmid, Nicki Reaper, MD  bismuth subsalicylate (PEPTO BISMOL) 262 MG chewable tablet Chew 524 mg by mouth as needed for diarrhea or loose stools.   Yes [provider]  carbidopa-levodopa (SINEMET IR) 25-100 MG tablet TAKE 1 AND 1/2 TABLETS BY MOUTH THREE TIMES DAILY Patient taking differently: Take 1.5 tablets by mouth 3 (three) times daily. 08/21/20 08/21/21 Yes Garvin Fila, MD  citalopram (CELEXA) 40 MG tablet TAKE 1 TABLET BY MOUTH ONCE A DAY Patient taking differently: Take 20 mg by mouth daily. 09/10/20 09/10/21 Yes Denita Lung, MD  docusate sodium (COLACE) 100 MG capsule Take 200 mg by mouth daily.   Yes [provider]  irbesartan (AVAPRO) 300 MG tablet TAKE 1 TABLET BY MOUTH EVERY MORNING Patient taking differently: Take 300 mg by mouth every morning. 03/14/20 03/14/21 Yes [name], MD  isosorbide mononitrate (IMDUR) 30 MG 24 hr tablet Take 1 tablet (30 mg total) by mouth daily.  (Take 1/2 hour after taking tylenol) 12/24/20 03/24/21 Yes [name], MD  L-Methylfolate-B12-B6-B2 (CEREFOLIN) 12-25-48-5 MG TABS Take 1 tablet by mouth daily. 10/01/20  Yes Garvin Fila, MD  Multiple Vitamin (MULTIVITAMIN WITH MINERALS) TABS tablet Take 1 tablet by mouth  daily.   Yes [provider]  nitroGLYCERIN (NITROSTAT) 0.4 MG SL tablet Place 1 tablet (0.4 mg total) under the tongue every 5 (five) minutes as needed for chest pain. 11/28/20  Yes [name], MD  pantoprazole (PROTONIX) 40 MG tablet Take 40 mg by mouth 2 (two) times daily. 06/17/15  Yes [provider]  simvastatin (ZOCOR) 40 MG tablet TAKE 1 TABLET BY MOUTH AT BEDTIME Patient taking differently: Take 40 mg by mouth at bedtime. 08/22/20 08/22/21 Yes Denita Lung, MD  timolol (TIMOPTIC) 0.5 % ophthalmic solution Place 1 drop into both eyes daily.   Yes [provider]  trihexyphenidyl (ARTANE) 2 MG tablet TAKE 1/2 TABLET BY MOUTH 2 TIMES DAILY WITH A MEAL Patient taking differently: Take 1 mg by mouth 2 (two) times daily with a meal. 10/01/20  10/01/21 Yes Garvin Fila, MD  verapamil (CALAN-SR) 240 MG CR tablet TAKE 1 TABLET BY MOUTH AT BEDTIME. Patient taking differently: Take 240 mg by mouth at bedtime. 02/08/20 02/19/21 Yes [name], MD  VITAMIN D PO Take 1 capsule by mouth daily.   Yes [provider]  meloxicam (MOBIC) 15 MG tablet Take 1 tablet by mouth with a meal once daily for 14 days. Patient not taking: Reported on 01/02/2021 12/19/20     metoprolol tartrate (LOPRESSOR) 50 MG tablet Take 2 tablets (100 mg total) by mouth once for 1 dose. TAKE TWO HOURS PRIOR TO  SCHEDULE CARDIAC TEST 12/24/20 12/25/20  [name], MD  predniSONE (DELTASONE) 10 MG tablet Take 3 tablets by mouth for 3 days, take 2 tablets for 2 days then take 1 tablet for 1 day 12/26/20        Family History  Problem Relation Age of Onset   Heart disease Mother    Cancer Paternal Grandfather    Cancer Sister     Social History   Socioeconomic History   Marital status: Married    Spouse name: barbra gen   Number of children: 3   Years of education: AS   Highest education level: Not on file  Occupational History   Occupation: self employed    Employer: [employer]: 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  Tobacco Use   Smoking status: Former    Pack years: 0.00    Types: Cigars    Quit date: 07/27/1988    Years since quitting: 32.4   Smokeless tobacco: Never  Vaping Use   Vaping Use: Never used  Substance and Sexual Activity   Alcohol use: Yes    Alcohol/week: 3.0 standard drinks    Types: 1 Glasses of wine, 1 Cans of beer, 1 Shots of liquor per week    Comment: occasional   Drug use: No   Sexual activity: Yes  Other Topics Concern   Not on file  Social History Narrative   Lives with wife    Right handed   Drinks 1-2 cups caffeine daily   Social Determinants of Health   Financial Resource Strain: Not on file  Food Insecurity: Not on file  Transportation Needs: Not on file  Physical Activity: Not on file  Stress: Not on file  Social Connections: Not on file       Review of Systems: A 12 point ROS discussed and pertinent positives are indicated in the HPI above.  All other systems are negative.  Review of Systems  Vital Signs: BP (!) 157/89 (BP Location: Right Arm)   Pulse 79   Temp (!) 97.5 F (36.4 C) (Oral)   Resp (!) 23   Ht _0  (1.778 m)   Wt 69.6 kg   SpO2 100%   BMI 22.02 kg/m   Physical Exam General: 70 yo male appearing stated age.  Well-developed, well-nourished.  No distress. Supine in bed HEENT: Atraumatic, normocephalic.  Conjugate gaze, extra-ocular motor intact. No scleral icterus or scleral injection. No lesions on external ears, nose, lips, or gums.  Oral mucosa moist, pink.  Neck: Symmetric with no goiter enlargement.  Chest/Lungs:  Symmetric chest with inspiration/expiration.  No labored breathing.  [name]  Heart:   No JVD appreciated.  Abdomen:  Soft, NT/ND, with + bowel sounds.   Genito-urinary: Deferred Neurologic: Alert & Oriented to person, place, and time.   Normal affect and insight.  Appropriate questions.  Moving all  4 extremities with gross sensory intact.  Pulse Exam:  Palpable AT & PT bilateral.      Imaging: OCT, Retina - OU - Both Eyes  Result Date: 01/02/2021 Right Eye Quality was good. Scan locations included subfoveal. Central Foveal Thickness: 205. Findings include outer retinal atrophy, central retinal atrophy. Left Eye Quality was good. Scan locations included subfoveal. Central Foveal Thickness: 381. Progression has been stable. Notes Diffuse macular atrophy right eye no interval change Mild diffuse macular thickening left eye with no active CME.  No change in foveal elevation over the last 2 years. Minor CME superonasal, but this is old and involutional and unchanged for years with no encroachment to center of the fovea  Color Fundus Photography Optos - OU - Both Eyes  Result Date: 01/02/2021 Right Eye Progression has been stable. Notes OD with cloudy media secondary to corneal opacity, striae from corneal edema Retina attached, optic nerve atrophy good PRP peripheral OS, mild temporal optic atrophy. 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, no active macular edema  VAS US CAROTID  Result Date: 01/02/2021 Carotid Arterial Duplex Study Patient Name:  [name]  Date of Exam:   01/01/2021 Medical Rec #: [id number]        Accession #:    [id number] Date of Birth: [birth year]        Patient Gender: M Patient Age:   89Y Exam Location:  [facility] Procedure:      VAS US CAROTID Referring Phys: [id number] DAVID W HARDING --------------------------------------------------------------------------------  Indications:       Patient reports intermittent tingling sensations in the left                    jaw area, across the left chest area at surround the front                    area of the left knee. Symptoms do not always come in all                    areas at the same time. These symptoms have been going on for                    one to two months. He also states he has been having                    headaches for the past week but relates it to a new                    medication he is taking (could not  recall the name). 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 Lightheadedness when he has to look                    over the right shoulder while driving. He denies any other                    cerebrovascular symptoms. Risk Factors:      Hyperlipidemia, past history of smoking. Comparison Study:  NA Performing Technologist: Sharlett Iles RVT  Examination Guidelines: A complete evaluation includes B-mode imaging, spectral Doppler, color Doppler, and power Doppler as needed  of all accessible portions of each vessel. Bilateral testing is considered an integral part of a complete examination. Limited examinations for reoccurring indications may be performed as noted.  Right Carotid Findings: +----------+--------+--------+--------+------------------+--------+           PSV cm/sEDV cm/sStenosisPlaque DescriptionComments +----------+--------+--------+--------+------------------+--------+ CCA Prox  53      6                                 tortuous +----------+--------+--------+--------+------------------+--------+ CCA Distal57      13                                         +----------+--------+--------+--------+------------------+--------+ ICA Prox  43      10      1-39%   heterogenous               +----------+--------+--------+--------+------------------+--------+ ICA Mid   41      17              heterogenous               +----------+--------+--------+--------+------------------+--------+ ICA Distal35      17                                         +----------+--------+--------+--------+------------------+--------+ ECA       77      10                                         +----------+--------+--------+--------+------------------+--------+ +----------+--------+-------+---------+-------------------+           PSV cm/sEDV cmsDescribe Arm Pressure (mmHG)  +----------+--------+-------+---------+-------------------+ [id number]            Turbulent117                 +----------+--------+-------+---------+-------------------+ +---------+--------+--+--------+-+---------------------------+ VertebralPSV cm/s26EDV cm/s6Antegrade and Small caliber +---------+--------+--+--------+-+---------------------------+  Left Carotid Findings: +----------+--------+--------+--------+------------------+--------+           PSV cm/sEDV cm/sStenosisPlaque DescriptionComments +----------+--------+--------+--------+------------------+--------+ CCA Prox  81      14                                         +----------+--------+--------+--------+------------------+--------+ CCA Distal78      15                                         +----------+--------+--------+--------+------------------+--------+ ICA Prox  101     33      1-39%   heterogenous               +----------+--------+--------+--------+------------------+--------+ ICA Mid   49      17                                         +----------+--------+--------+--------+------------------+--------+ ICA Distal52      24                                         +----------+--------+--------+--------+------------------+--------+  ECA       81      10                                         +----------+--------+--------+--------+------------------+--------+ +----------+--------+--------+----------------+-------------------+           PSV cm/sEDV cm/sDescribe        Arm Pressure (mmHG) +----------+--------+--------+----------------+-------------------+ Subclavian160             Multiphasic, [id number]                 +----------+--------+--------+----------------+-------------------+ +---------+--------+--+--------+--+---------+ VertebralPSV cm/s37EDV cm/s13Antegrade +---------+--------+--+--------+--+---------+   Summary: Right Carotid: Velocities in the right ICA are  consistent with a 1-39% stenosis. Left Carotid: Velocities in the left ICA are consistent with a 1-39% stenosis. Vertebrals:  Bilateral vertebral arteries demonstrate antegrade flow. Small              caliber right vertebral artery. Subclavians: Right subclavian artery flow was disturbed. Normal flow              hemodynamics were seen in the left subclavian artery. *See table(s) above for measurements and observations.  Electronically signed by Larae Grooms MD on 01/02/2021 at 2:23:23 PM.    Final    CT Angio Abd/Pel w/ and/or w/o  Result Date: 01/04/2021 CLINICAL DATA:  70 year old male with melena and maroon colored stools. Evaluate for active GI bleed. EXAM: CTA ABDOMEN AND PELVIS WITHOUT AND WITH CONTRAST TECHNIQUE: Multidetector CT imaging of the abdomen and pelvis was performed using the standard protocol during bolus administration of intravenous contrast. Multiplanar reconstructed images and MIPs were obtained and reviewed to evaluate the vascular anatomy. CONTRAST:  152m OMNIPAQUE IOHEXOL 350 MG/ML SOLN COMPARISON:  Prior CT abdomen/pelvis 04/18/2018; CT chest 10/08/2016 FINDINGS: VASCULAR Aorta: Scattered atherosclerotic vascular calcifications throughout the abdominal aorta. No evidence of aneurysm or dissection. Celiac: Patent without evidence of aneurysm, dissection, vasculitis or significant stenosis. SMA: Patent without evidence of aneurysm, dissection, vasculitis or significant stenosis. Renals: Mild stenosis at the origin of the right renal artery secondary to fibrofatty atherosclerotic plaque. Left renal artery is widely patent. No accessory renal arteries. IMA: Focal moderate to high-grade stenosis at the origin. The remainder of the artery is patent. Inflow: The iliac arteries are highly tortuous. Scattered atherosclerotic calcifications without significant stenosis, occlusion, dissection or aneurysm. Proximal Outflow: Bilateral common femoral and visualized portions of the superficial  and profunda femoral arteries are patent without evidence of aneurysm, dissection, vasculitis or significant stenosis. Veins: No focal venous abnormality. Review of the MIP images confirms the above findings. NON-VASCULAR Lower chest: No acute abnormality. Greater than 2 year stability of small pulmonary nodules (images 17 and 28 of series 10) consistent with benign granuloma. Trace dependent atelectasis. The visualized heart is normal in size. No pericardial effusion. No significant abnormality of the visualized distal thoracic esophagus. Hepatobiliary: No focal liver abnormality is seen. No gallstones, gallbladder wall thickening, or biliary dilatation. Pancreas: Unremarkable. No pancreatic ductal dilatation or surrounding inflammatory changes. Spleen: Normal in size without focal abnormality. Adrenals/Urinary Tract: The adrenal glands are normal. No hydronephrosis, nephrolithiasis or enhancing renal mass. Large simple cysts exophytic from the upper pole of the right kidney in the anterior interpolar left kidney. The ureters and bladder are unremarkable. Stomach/Bowel: Active extravasation of arterial phase contrast into a diverticulum of the ascending colon just proximal to the hepatic flexure (images 80-94) with evidence of blooming and  expansion of the contrast blush on the delayed phase images. Findings are consistent with acute diverticular hemorrhage. Stomach and proximal small bowel are within normal limits. No focal bowel wall thickening or evidence of bowel obstruction. Lymphatic: No suspicious lymphadenopathy. Reproductive: Prostatomegaly. Other: Fat containing umbilical hernia.  No evidence of ascites. Musculoskeletal: No acute fracture or aggressive appearing lytic or blastic osseous lesion. Mild grade 1 anterolisthesis of L4 on L5 without spondylo lysis. Associated lower lumbar degenerative disc disease and facet arthropathy. IMPRESSION: 1. Positive for active diverticular bleed in the ascending colon  just proximal to the hepatic flexure. 2. Scattered atherosclerotic vascular calcifications without aneurysm, dissection or occlusion. Aortic Atherosclerosis (ICD10-I70.0). 3. Progressive lower lumbar degenerative disc disease and facet arthropathy now with mild grade 1 anterolisthesis of L4 on L5. 4. Additional ancillary findings as above without significant interval change compared to prior imaging. Electronically Signed   By: Jacqulynn Cadet M.D.   On: 01/04/2021 09:38    Labs:  CBC: Recent Labs    01/02/21 1238 01/02/21 2245 01/03/21 0412 01/04/21 0011 01/04/21 0831  WBC 8.2  --  7.5 6.1 9.2  HGB 9.1* 9.8* 9.9* 9.5* 7.9*  HCT 27.9* 30.3* 30.3* 28.0* 24.0*  PLT 193  --  213 202 221    COAGS: No results for input(s): INR, APTT in the last 8760 hours.  BMP: Recent Labs    09/10/20 1058 01/02/21 1238 01/03/21 0412 01/04/21 0011 01/04/21 0830  NA 137 133* 135 133* 135  K 4.3 4.0 4.1 3.9 4.0  CL 101 104 104 103 105  CO2 _0 GLUCOSE 96 95 77 119* 136*  BUN 22 47* 32* 33* 35*  CALCIUM 9.4 8.0* 8.5* 8.2* 8.1*  CREATININE 1.24 1.18 0.94 1.06 1.17  GFRNONAA 59* >60 >60 >60 >60  GFRAA 68  --   --   --   --     LIVER FUNCTION TESTS: Recent Labs    09/10/20 1058 01/02/21 1238  BILITOT 0.5 0.5  AST 16 13*  ALT 10 8  ALKPHOS 74 37*  PROT 7.4 5.3*  ALBUMIN 4.8 3.1*    TUMOR MARKERS: No results for input(s): AFPTM, CEA, CA199, CHROMGRNA in the last 8760 hours.  Assessment and Plan:  [name] is a 70 yo male with acute life-threatening lower GI hemorrhage, right colon on CTA performed today.    I had a lengthy discussion with he and his wife in his room to talk about the indications, risk/benefit of mesenteric angio and possible embolization.  Specifically these include bleeding, infection, vascular injury or contrast induced renal failure, inability to localize and treat, possible further procedure/surgery, cardiopulmonary collapse, death.   This  interventional procedure involves the use of X-rays and because of the nature of the planned procedure, it is possible that we will have prolonged use of X-ray fluoroscopy.  Potential radiation risks to you include (but are not limited to) the following: - A slightly elevated risk for cancer  several years later in life. This risk is typically less than 0.5% percent. This risk is low in comparison to the normal incidence of human cancer, which is 33% for women and 50% for men according to the [place]. - Radiation induced injury can include skin redness, resembling a rash, tissue breakdown / ulcers and hair loss (which can be temporary or permanent).   The likelihood of either of these occurring depends on the difficulty of the procedure and whether you are sensitive to  radiation due to previous procedures, disease, or genetic conditions.   IF your procedure requires a prolonged use of radiation, you will be notified and given written instructions for further action.  It is your responsibility to monitor the irradiated area for the 2 weeks following the procedure and to notify your physician if you are concerned that you have suffered a radiation induced injury.    After our discussion, both the patient and his wife understand this is our recommendation and the indications.    All of the patient's questions were answered, patient is agreeable to proceed.  Consent signed and in chart.  Plan for mesenteric angiogram and possible embolization.   Thank you for this interesting consult.  I greatly enjoyed meeting [name] and look forward to participating in their care.  A copy of this report was sent to the requesting provider on this date.  Electronically Signed: Corrie Mckusick, DO 01/04/2021, 10:40 AM   I spent a total of 64 Miinutes    in face to face in clinical consultation, greater than 50% of which was counseling/coordinating care for acute lower GI hemorrhage,  possible embolization.

## 2021-01-04 NOTE — Sedation Documentation (Signed)
Patient transported to 2 west SD/ICU. RN staff at the bedside. Groin site assessed. Clean, dry and intact. No drainage noted from dressing. Soft to palpation, no hematoma noted. Distal pulses intact +1

## 2021-01-04 NOTE — Progress Notes (Signed)
PROGRESS NOTE    GORDAN GRELL  IHK:[id number] DOB: 1951/03/22 DOA: 01/02/2021 PCP: Denita Lung, MD  Outpatient Specialists:   Brief Narrative:  Patient is a 70 year old African-American male with past medical history significant for Parkinson disease, BPH, HTN, HLD and GI bleed.  Patient was admitted with GI bleed.  Patient underwent EGD that did not reveal source of GI bleed.  Patient continued to have intermittent bleeding during the hospital stay and the etiology of the bleeding was thought to be diverticulosis.  Overnight, patient has had intermittent rectal bleeding and, as per collateral information, had hypotensive episode with brief period of syncope.  Patient underwent CT angiography of the abdomen and pelvis that localized to bleed into the ascending colon.  Interventional radiology team was consulted for possible angiogram and embolization.  The procedure was not successful as the patient was not having significant bleeding during the procedure.  Recent drop in H/H noted.  GI team is directing care.  CBC will be monitored every 12 hours.  Patient is also on low-dose MiraLAX to help clear the GI bleed.  Further management will depend on hospital course.  We will also have a low threshold to consult the surgical team.   Assessment & Plan:   Active Problems:   Acute GI bleeding   GI bleed   GI bleed: -EGD was nonrevealing.   -CT angiogram for localized bleeding to the ascending colon. -Angiogram and embolization was not successful as of no significant bleeding during the procedure. -CBC is currently being monitored every 12 hours. -GI team is directing care.   -We will have a low threshold to consult the surgical team.  Acute blood loss anemia: -Secondary to diverticular bleed. -See above documentation. -Patient's hemoglobin was around 14 g/dL about 4 months ago.  On presentation, patient's hemoglobin was around 9 g/dL. -Hemoglobin dropped to 7.9 g/dL overnight. -We will  continue to monitor CBC.   Hypertension/hypotension:  -Patient was actually hypotensive at some point last night necessitating volume resuscitation. -Continue to monitor closely.    Parkinson disease -Continue Sinemet   DVT prophylaxis: SCD. Code Status: Full code Family Communication: Wife Disposition Plan: Home eventually   Consultants:  Gastroenterology. Interventional radiology.  Procedures:  EGD. Unsuccessful angiogram and embolization.    Antimicrobials:  None.   Subjective: Patient continues to report intermittent rectal bleed  No chest pain No shortness of breath  Objective: Vitals:   01/04/21 1315 01/04/21 1330 01/04/21 1400 01/04/21 1425  BP:  (!) 147/87 (!) 181/94 (!) 163/91  Pulse: 77 78 80 72  Resp: 15 19 17 13   Temp:      TempSrc:      SpO2: 99% 98% 100% 100%  Weight:      Height:        Intake/Output Summary (Last 24 hours) at 01/04/2021 1609 Last data filed at 01/04/2021 1050 Gross per 24 hour  Intake 500 ml  Output 300 ml  Net 200 ml    Filed Weights   01/02/21 1213 01/02/21 2017  Weight: 72.6 kg 69.6 kg    Examination:  General exam: Appears calm and comfortable.  Patient is pale. Respiratory system: Clear to auscultation.  Cardiovascular system: S1 & S2 heard, Gastrointestinal system: Abdomen is nondistended, soft and nontender. No organomegaly or masses felt. Normal bowel sounds heard. Central nervous system: Alert and oriented.  Patient moves all extremities.  Tremors. Extremities: No leg edema.  Data Reviewed: I have personally reviewed following labs and imaging studies  CBC:  Recent Labs  Lab 01/02/21 1238 01/02/21 2245 01/03/21 0412 01/04/21 0011 01/04/21 0831  WBC 8.2  --  7.5 6.1 9.2  NEUTROABS  --   --   --   --  6.1  HGB 9.1* 9.8* 9.9* 9.5* 7.9*  HCT 27.9* 30.3* 30.3* 28.0* 24.0*  MCV 97.2  --  96.2 94.6 95.2  PLT 193  --  213 202 967    Basic Metabolic Panel: Recent Labs  Lab 01/02/21 1238  01/03/21 0412 01/04/21 0011 01/04/21 0830  NA 133* 135 133* 135  K 4.0 4.1 3.9 4.0  CL 104 104 103 105  CO2 24 26 24 23   GLUCOSE 95 77 119* 136*  BUN 47* 32* 33* 35*  CREATININE 1.18 0.94 1.06 1.17  CALCIUM 8.0* 8.5* 8.2* 8.1*    GFR: Estimated Creatinine Clearance: 57 mL/min (by C-G formula based on SCr of 1.17 mg/dL). Liver Function Tests: Recent Labs  Lab 01/02/21 1238  AST 13*  ALT 8  ALKPHOS 37*  BILITOT 0.5  PROT 5.3*  ALBUMIN 3.1*    No results for input(s): LIPASE, AMYLASE in the last 168 hours. No results for input(s): AMMONIA in the last 168 hours. Coagulation Profile: No results for input(s): INR, PROTIME in the last 168 hours. Cardiac Enzymes: No results for input(s): CKTOTAL, CKMB, CKMBINDEX, TROPONINI in the last 168 hours. BNP (last 3 results) No results for input(s): PROBNP in the last 8760 hours. HbA1C: No results for input(s): HGBA1C in the last 72 hours. CBG: No results for input(s): GLUCAP in the last 168 hours. Lipid Profile: No results for input(s): CHOL, HDL, LDLCALC, TRIG, CHOLHDL, LDLDIRECT in the last 72 hours. Thyroid Function Tests: No results for input(s): TSH, T4TOTAL, FREET4, T3FREE, THYROIDAB in the last 72 hours. Anemia Panel: No results for input(s): VITAMINB12, FOLATE, FERRITIN, TIBC, IRON, RETICCTPCT in the last 72 hours. Urine analysis:    Component Value Date/Time   COLORURINE YELLOW 04/18/2018 1019   APPEARANCEUR CLEAR 04/18/2018 1019   LABSPEC 1.010 01/17/2020 1502   PHURINE 7.0 04/18/2018 1019   GLUCOSEU NEGATIVE 04/18/2018 1019   HGBUR NEGATIVE 04/18/2018 1019   BILIRUBINUR negative 01/17/2020 1502   KETONESUR negative 01/17/2020 1502   KETONESUR NEGATIVE 04/18/2018 1019   PROTEINUR negative 01/17/2020 1502   PROTEINUR NEGATIVE 04/18/2018 1019   UROBILINOGEN 0.2 03/14/2014 2107   NITRITE Negative 01/17/2020 1502   NITRITE NEGATIVE 04/18/2018 1019   LEUKOCYTESUR Negative 01/17/2020 1502   Sepsis  Labs: @LABRCNTIP (procalcitonin:4,lacticidven:4)  ) Recent Results (from the past 240 hour(s))  Resp Panel by RT-PCR (Flu A&B, Covid) Nasopharyngeal Swab     Status: None   Collection Time: 01/02/21  3:45 PM   Specimen: Nasopharyngeal Swab; Nasopharyngeal(NP) swabs in vial transport medium  Result Value Ref Range Status   SARS Coronavirus 2 by RT PCR NEGATIVE NEGATIVE Final    Comment: (NOTE) SARS-CoV-2 target nucleic acids are NOT DETECTED.  The SARS-CoV-2 RNA is generally detectable in upper respiratory specimens during the acute phase of infection. The lowest concentration of SARS-CoV-2 viral copies this assay can detect is 138 copies/mL. A negative result does not preclude SARS-Cov-2 infection and should not be used as the sole basis for treatment or other patient management decisions. A negative result may occur with  improper specimen collection/handling, submission of specimen other than nasopharyngeal swab, presence of viral mutation(s) within the areas targeted by this assay, and inadequate number of viral copies(<138 copies/mL). A negative result must be combined with clinical observations, patient history, and  epidemiological information. The expected result is Negative.  Fact Sheet for Patients:  EntrepreneurPulse.com.au  Fact Sheet for Healthcare Providers:  IncredibleEmployment.be  This test is no t yet approved or cleared by the [place] FDA and  has been authorized for detection and/or diagnosis of SARS-CoV-2 by FDA under an Emergency Use Authorization (EUA). This EUA will remain  in effect (meaning this test can be used) for the duration of the COVID-19 declaration under Section 564(b)(1) of the Act, 21 U.S.C.section 360bbb-3(b)(1), unless the authorization is terminated  or revoked sooner.       Influenza A by PCR NEGATIVE NEGATIVE Final   Influenza B by PCR NEGATIVE NEGATIVE Final    Comment: (NOTE) The Xpert  Xpress SARS-CoV-2/FLU/RSV plus assay is intended as an aid in the diagnosis of influenza from Nasopharyngeal swab specimens and should not be used as a sole basis for treatment. Nasal washings and aspirates are unacceptable for Xpert Xpress SARS-CoV-2/FLU/RSV testing.  Fact Sheet for Patients: EntrepreneurPulse.com.au  Fact Sheet for Healthcare Providers: IncredibleEmployment.be  This test is not yet approved or cleared by the [place] FDA and has been authorized for detection and/or diagnosis of SARS-CoV-2 by FDA under an Emergency Use Authorization (EUA). This EUA will remain in effect (meaning this test can be used) for the duration of the COVID-19 declaration under Section 564(b)(1) of the Act, 21 U.S.C. section 360bbb-3(b)(1), unless the authorization is terminated or revoked.  Performed at [facility], [place] [address] [place], [place] [address]   MRSA PCR Screening     Status: None   Collection Time: 01/04/21 11:52 AM   Specimen: Nasal Mucosa; Nasopharyngeal  Result Value Ref Range Status   MRSA by PCR NEGATIVE NEGATIVE Final    Comment:        The GeneXpert MRSA Assay (FDA approved for NASAL specimens only), is one component of a comprehensive MRSA colonization surveillance program. It is not intended to diagnose MRSA infection nor to guide or monitor treatment for MRSA infections. Performed at [facility], [place] [address] [place], [place] [address]          Radiology Studies: IR Angiogram Visceral Selective  Result Date: 01/04/2021 INDICATION: 70 year old male with a history of lower GI hemorrhage, isolated to the ascending colon on recent CT angiogram, presents for mesenteric angiogram and possible embolization EXAM: ULTRASOUND-GUIDED ACCESS RIGHT COMMON FEMORAL ARTERY MESENTERIC ANGIOGRAM INVOLVING SMA, SUB SELECTION RIGHT COLIC ARTERY, AND SUPER SELECTIVE ANGIOGRAM OF TERMINAL  RIGHT COLIC ARTERY BRANCHES EXOSEAL FOR HEMOSTASIS MEDICATIONS: None ANESTHESIA/SEDATION: Moderate (conscious) sedation was employed during this procedure. A total of Versed 1.0 mg and Fentanyl 25 mcg was administered intravenously. Moderate Sedation Time: 39 minutes. The patient's level of consciousness and vital signs were monitored continuously by radiology nursing throughout the procedure under my direct supervision. CONTRAST:  80 cc FLUOROSCOPY TIME:  Fluoroscopy Time: 5 minutes 36 seconds (2,325 mGy). COMPLICATIONS: None PROCEDURE: Informed consent was obtained from the patient following explanation of the procedure, risks, benefits and alternatives. The patient understands, agrees and consents for the procedure. All questions were addressed. A time out was performed prior to the initiation of the procedure. Maximal barrier sterile technique utilized including caps, mask, sterile gowns, sterile gloves, large sterile drape, hand hygiene, and Betadine prep. Ultrasound survey of the right inguinal region was performed with images stored and sent to PACs, confirming patency of the vessel. A micropuncture needle was used access the right common femoral artery under ultrasound. With excellent arterial blood  flow returned, and an .018 micro wire was passed through the needle, observed enter the abdominal aorta under fluoroscopy. The needle was removed, and a micropuncture sheath was placed over the wire. The inner dilator and wire were removed, and an 035 Bentson wire was advanced under fluoroscopy into the abdominal aorta. The sheath was removed and a standard 5 [place] vascular sheath was placed. The dilator was removed and the sheath was flushed. C2 Cobra catheter advanced on the Bentson wire. Cobra catheter was used to select the SMA. Angiogram was performed. We attempted to advance the C2 catheter on a standard Glidewire, which was unable given the tortuous SMA takeoff. A coaxial high-flow Renegade 135 cm  microcatheter was then advanced on 014 fathom wire into the ileocolic artery. Angiogram was performed. The right colic artery was selected. Angiogram was performed. The microcatheter was then used to select terminal branches of the right colic artery independently, with angiogram performed at each artery in multiple obliquities using both breath hold and free breathing technique. Ultimately, we could not isolate the site of hemorrhage correlating to the findings on the CT angiogram as there was no extravasation of count tract, shunting, or angio dysplasia that was target oval for empiric embolization. All catheters wires were removed and Exoseal was deployed for hemostasis. Patient tolerated the procedure well and remained hemodynamically stable throughout. No complications were encountered and no significant blood loss. IMPRESSION: Status post ultrasound guided access right common femoral artery for SMA angiogram, super selective angiogram the right colic arteries of interest in attempt to isolate the source of recent lower GI hemorrhage. No embolization was performed. ExoSeal for hemostasis. Signed, Dulcy Fanny. Dellia Nims, RPVI Vascular and Interventional Radiology Specialists [facility] Radiology Electronically Signed   By: Corrie Mckusick D.O.   On: 01/04/2021 13:01   IR Angiogram Selective Each Additional Vessel  Result Date: 01/04/2021 INDICATION: 70 year old male with a history of lower GI hemorrhage, isolated to the ascending colon on recent CT angiogram, presents for mesenteric angiogram and possible embolization EXAM: ULTRASOUND-GUIDED ACCESS RIGHT COMMON FEMORAL ARTERY MESENTERIC ANGIOGRAM INVOLVING SMA, SUB SELECTION RIGHT COLIC ARTERY, AND SUPER SELECTIVE ANGIOGRAM OF TERMINAL RIGHT COLIC ARTERY BRANCHES EXOSEAL FOR HEMOSTASIS MEDICATIONS: None ANESTHESIA/SEDATION: Moderate (conscious) sedation was employed during this procedure. A total of Versed 1.0 mg and Fentanyl 25 mcg was administered  intravenously. Moderate Sedation Time: 39 minutes. The patient's level of consciousness and vital signs were monitored continuously by radiology nursing throughout the procedure under my direct supervision. CONTRAST:  80 cc FLUOROSCOPY TIME:  Fluoroscopy Time: 5 minutes 36 seconds (2,325 mGy). COMPLICATIONS: None PROCEDURE: Informed consent was obtained from the patient following explanation of the procedure, risks, benefits and alternatives. The patient understands, agrees and consents for the procedure. All questions were addressed. A time out was performed prior to the initiation of the procedure. Maximal barrier sterile technique utilized including caps, mask, sterile gowns, sterile gloves, large sterile drape, hand hygiene, and Betadine prep. Ultrasound survey of the right inguinal region was performed with images stored and sent to PACs, confirming patency of the vessel. A micropuncture needle was used access the right common femoral artery under ultrasound. With excellent arterial blood flow returned, and an .018 micro wire was passed through the needle, observed enter the abdominal aorta under fluoroscopy. The needle was removed, and a micropuncture sheath was placed over the wire. The inner dilator and wire were removed, and an 035 Bentson wire was advanced under fluoroscopy into the abdominal aorta. The sheath was removed and  a standard 5 [place] vascular sheath was placed. The dilator was removed and the sheath was flushed. C2 Cobra catheter advanced on the Bentson wire. Cobra catheter was used to select the SMA. Angiogram was performed. We attempted to advance the C2 catheter on a standard Glidewire, which was unable given the tortuous SMA takeoff. A coaxial high-flow Renegade 135 cm microcatheter was then advanced on 014 fathom wire into the ileocolic artery. Angiogram was performed. The right colic artery was selected. Angiogram was performed. The microcatheter was then used to select terminal branches  of the right colic artery independently, with angiogram performed at each artery in multiple obliquities using both breath hold and free breathing technique. Ultimately, we could not isolate the site of hemorrhage correlating to the findings on the CT angiogram as there was no extravasation of count tract, shunting, or angio dysplasia that was target oval for empiric embolization. All catheters wires were removed and Exoseal was deployed for hemostasis. Patient tolerated the procedure well and remained hemodynamically stable throughout. No complications were encountered and no significant blood loss. IMPRESSION: Status post ultrasound guided access right common femoral artery for SMA angiogram, super selective angiogram the right colic arteries of interest in attempt to isolate the source of recent lower GI hemorrhage. No embolization was performed. ExoSeal for hemostasis. Signed, Dulcy Fanny. Dellia Nims, RPVI Vascular and Interventional Radiology Specialists [facility] Radiology Electronically Signed   By: Corrie Mckusick D.O.   On: 01/04/2021 13:01   IR Angiogram Selective Each Additional Vessel  Result Date: 01/04/2021 INDICATION: 70 year old male with a history of lower GI hemorrhage, isolated to the ascending colon on recent CT angiogram, presents for mesenteric angiogram and possible embolization EXAM: ULTRASOUND-GUIDED ACCESS RIGHT COMMON FEMORAL ARTERY MESENTERIC ANGIOGRAM INVOLVING SMA, SUB SELECTION RIGHT COLIC ARTERY, AND SUPER SELECTIVE ANGIOGRAM OF TERMINAL RIGHT COLIC ARTERY BRANCHES EXOSEAL FOR HEMOSTASIS MEDICATIONS: None ANESTHESIA/SEDATION: Moderate (conscious) sedation was employed during this procedure. A total of Versed 1.0 mg and Fentanyl 25 mcg was administered intravenously. Moderate Sedation Time: 39 minutes. The patient's level of consciousness and vital signs were monitored continuously by radiology nursing throughout the procedure under my direct supervision. CONTRAST:  80 cc FLUOROSCOPY  TIME:  Fluoroscopy Time: 5 minutes 36 seconds (2,325 mGy). COMPLICATIONS: None PROCEDURE: Informed consent was obtained from the patient following explanation of the procedure, risks, benefits and alternatives. The patient understands, agrees and consents for the procedure. All questions were addressed. A time out was performed prior to the initiation of the procedure. Maximal barrier sterile technique utilized including caps, mask, sterile gowns, sterile gloves, large sterile drape, hand hygiene, and Betadine prep. Ultrasound survey of the right inguinal region was performed with images stored and sent to PACs, confirming patency of the vessel. A micropuncture needle was used access the right common femoral artery under ultrasound. With excellent arterial blood flow returned, and an .018 micro wire was passed through the needle, observed enter the abdominal aorta under fluoroscopy. The needle was removed, and a micropuncture sheath was placed over the wire. The inner dilator and wire were removed, and an 035 Bentson wire was advanced under fluoroscopy into the abdominal aorta. The sheath was removed and a standard 5 [place] vascular sheath was placed. The dilator was removed and the sheath was flushed. C2 Cobra catheter advanced on the Bentson wire. Cobra catheter was used to select the SMA. Angiogram was performed. We attempted to advance the C2 catheter on a standard Glidewire, which was unable given the tortuous SMA takeoff. A coaxial  high-flow Renegade 135 cm microcatheter was then advanced on 014 fathom wire into the ileocolic artery. Angiogram was performed. The right colic artery was selected. Angiogram was performed. The microcatheter was then used to select terminal branches of the right colic artery independently, with angiogram performed at each artery in multiple obliquities using both breath hold and free breathing technique. Ultimately, we could not isolate the site of hemorrhage correlating to the  findings on the CT angiogram as there was no extravasation of count tract, shunting, or angio dysplasia that was target oval for empiric embolization. All catheters wires were removed and Exoseal was deployed for hemostasis. Patient tolerated the procedure well and remained hemodynamically stable throughout. No complications were encountered and no significant blood loss. IMPRESSION: Status post ultrasound guided access right common femoral artery for SMA angiogram, super selective angiogram the right colic arteries of interest in attempt to isolate the source of recent lower GI hemorrhage. No embolization was performed. ExoSeal for hemostasis. Signed, Dulcy Fanny. Dellia Nims, RPVI Vascular and Interventional Radiology Specialists [facility] Radiology Electronically Signed   By: Corrie Mckusick D.O.   On: 01/04/2021 13:01   IR US Guide Vasc Access Right  Result Date: 01/04/2021 INDICATION: 70 year old male with a history of lower GI hemorrhage, isolated to the ascending colon on recent CT angiogram, presents for mesenteric angiogram and possible embolization EXAM: ULTRASOUND-GUIDED ACCESS RIGHT COMMON FEMORAL ARTERY MESENTERIC ANGIOGRAM INVOLVING SMA, SUB SELECTION RIGHT COLIC ARTERY, AND SUPER SELECTIVE ANGIOGRAM OF TERMINAL RIGHT COLIC ARTERY BRANCHES EXOSEAL FOR HEMOSTASIS MEDICATIONS: None ANESTHESIA/SEDATION: Moderate (conscious) sedation was employed during this procedure. A total of Versed 1.0 mg and Fentanyl 25 mcg was administered intravenously. Moderate Sedation Time: 39 minutes. The patient's level of consciousness and vital signs were monitored continuously by radiology nursing throughout the procedure under my direct supervision. CONTRAST:  80 cc FLUOROSCOPY TIME:  Fluoroscopy Time: 5 minutes 36 seconds (2,325 mGy). COMPLICATIONS: None PROCEDURE: Informed consent was obtained from the patient following explanation of the procedure, risks, benefits and alternatives. The patient understands, agrees and  consents for the procedure. All questions were addressed. A time out was performed prior to the initiation of the procedure. Maximal barrier sterile technique utilized including caps, mask, sterile gowns, sterile gloves, large sterile drape, hand hygiene, and Betadine prep. Ultrasound survey of the right inguinal region was performed with images stored and sent to PACs, confirming patency of the vessel. A micropuncture needle was used access the right common femoral artery under ultrasound. With excellent arterial blood flow returned, and an .018 micro wire was passed through the needle, observed enter the abdominal aorta under fluoroscopy. The needle was removed, and a micropuncture sheath was placed over the wire. The inner dilator and wire were removed, and an 035 Bentson wire was advanced under fluoroscopy into the abdominal aorta. The sheath was removed and a standard 5 [place] vascular sheath was placed. The dilator was removed and the sheath was flushed. C2 Cobra catheter advanced on the Bentson wire. Cobra catheter was used to select the SMA. Angiogram was performed. We attempted to advance the C2 catheter on a standard Glidewire, which was unable given the tortuous SMA takeoff. A coaxial high-flow Renegade 135 cm microcatheter was then advanced on 014 fathom wire into the ileocolic artery. Angiogram was performed. The right colic artery was selected. Angiogram was performed. The microcatheter was then used to select terminal branches of the right colic artery independently, with angiogram performed at each artery in multiple obliquities using both breath hold and  free breathing technique. Ultimately, we could not isolate the site of hemorrhage correlating to the findings on the CT angiogram as there was no extravasation of count tract, shunting, or angio dysplasia that was target oval for empiric embolization. All catheters wires were removed and Exoseal was deployed for hemostasis. Patient tolerated the  procedure well and remained hemodynamically stable throughout. No complications were encountered and no significant blood loss. IMPRESSION: Status post ultrasound guided access right common femoral artery for SMA angiogram, super selective angiogram the right colic arteries of interest in attempt to isolate the source of recent lower GI hemorrhage. No embolization was performed. ExoSeal for hemostasis. Signed, Dulcy Fanny. Dellia Nims, RPVI Vascular and Interventional Radiology Specialists [facility] Radiology Electronically Signed   By: Corrie Mckusick D.O.   On: 01/04/2021 13:01   OCT, Retina - OU - Both Eyes  Result Date: 01/02/2021 Right Eye Quality was good. Scan locations included subfoveal. Central Foveal Thickness: 205. Findings include outer retinal atrophy, central retinal atrophy. Left Eye Quality was good. Scan locations included subfoveal. Central Foveal Thickness: 381. Progression has been stable. Notes Diffuse macular atrophy right eye no interval change Mild diffuse macular thickening left eye with no active CME.  No change in foveal elevation over the last 2 years. Minor CME superonasal, but this is old and involutional and unchanged for years with no encroachment to center of the fovea  Color Fundus Photography Optos - OU - Both Eyes  Result Date: 01/02/2021 Right Eye Progression has been stable. Notes OD with cloudy media secondary to corneal opacity, striae from corneal edema Retina attached, optic nerve atrophy good PRP peripheral OS, mild temporal optic atrophy. 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, no active macular edema  CT Angio Abd/Pel w/ and/or w/o  Result Date: 01/04/2021 CLINICAL DATA:  70 year old male with melena and maroon colored stools. Evaluate for active GI bleed. EXAM: CTA ABDOMEN AND PELVIS WITHOUT AND WITH CONTRAST TECHNIQUE: Multidetector CT imaging of the abdomen and pelvis was performed using the standard protocol during bolus administration of intravenous contrast.  Multiplanar reconstructed images and MIPs were obtained and reviewed to evaluate the vascular anatomy. CONTRAST:  158mL OMNIPAQUE IOHEXOL 350 MG/ML SOLN COMPARISON:  Prior CT abdomen/pelvis 04/18/2018; CT chest 10/08/2016 FINDINGS: VASCULAR Aorta: Scattered atherosclerotic vascular calcifications throughout the abdominal aorta. No evidence of aneurysm or dissection. Celiac: Patent without evidence of aneurysm, dissection, vasculitis or significant stenosis. SMA: Patent without evidence of aneurysm, dissection, vasculitis or significant stenosis. Renals: Mild stenosis at the origin of the right renal artery secondary to fibrofatty atherosclerotic plaque. Left renal artery is widely patent. No accessory renal arteries. IMA: Focal moderate to high-grade stenosis at the origin. The remainder of the artery is patent. Inflow: The iliac arteries are highly tortuous. Scattered atherosclerotic calcifications without significant stenosis, occlusion, dissection or aneurysm. Proximal Outflow: Bilateral common femoral and visualized portions of the superficial and profunda femoral arteries are patent without evidence of aneurysm, dissection, vasculitis or significant stenosis. Veins: No focal venous abnormality. Review of the MIP images confirms the above findings. NON-VASCULAR Lower chest: No acute abnormality. Greater than 2 year stability of small pulmonary nodules (images 17 and 28 of series 10) consistent with benign granuloma. Trace dependent atelectasis. The visualized heart is normal in size. No pericardial effusion. No significant abnormality of the visualized distal thoracic esophagus. Hepatobiliary: No focal liver abnormality is seen. No gallstones, gallbladder wall thickening, or biliary dilatation. Pancreas: Unremarkable. No pancreatic ductal dilatation or surrounding inflammatory changes. Spleen: Normal in size without  focal abnormality. Adrenals/Urinary Tract: The adrenal glands are normal. No hydronephrosis,  nephrolithiasis or enhancing renal mass. Large simple cysts exophytic from the upper pole of the right kidney in the anterior interpolar left kidney. The ureters and bladder are unremarkable. Stomach/Bowel: Active extravasation of arterial phase contrast into a diverticulum of the ascending colon just proximal to the hepatic flexure (images 80-94) with evidence of blooming and expansion of the contrast blush on the delayed phase images. Findings are consistent with acute diverticular hemorrhage. Stomach and proximal small bowel are within normal limits. No focal bowel wall thickening or evidence of bowel obstruction. Lymphatic: No suspicious lymphadenopathy. Reproductive: Prostatomegaly. Other: Fat containing umbilical hernia.  No evidence of ascites. Musculoskeletal: No acute fracture or aggressive appearing lytic or blastic osseous lesion. Mild grade 1 anterolisthesis of L4 on L5 without spondylo lysis. Associated lower lumbar degenerative disc disease and facet arthropathy. IMPRESSION: 1. Positive for active diverticular bleed in the ascending colon just proximal to the hepatic flexure. 2. Scattered atherosclerotic vascular calcifications without aneurysm, dissection or occlusion. Aortic Atherosclerosis (ICD10-I70.0). 3. Progressive lower lumbar degenerative disc disease and facet arthropathy now with mild grade 1 anterolisthesis of L4 on L5. 4. Additional ancillary findings as above without significant interval change compared to prior imaging. Electronically Signed   By: Jacqulynn Cadet M.D.   On: 01/04/2021 09:38        Scheduled Meds:  alfuzosin  10 mg Oral Daily   carbidopa-levodopa  1.5 tablet Oral TID   Chlorhexidine Gluconate Cloth  6 each Topical Daily   citalopram  20 mg Oral Daily   docusate sodium  200 mg Oral Daily   lidocaine       mouth rinse  15 mL Mouth Rinse BID   multivitamin with minerals  1 tablet Oral Daily   nitroGLYCERIN  0.1 mg Transdermal Daily   pantoprazole (PROTONIX)  IV  40 mg Intravenous Q12H   polyethylene glycol  17 g Oral Daily   simvastatin  40 mg Oral QHS   sodium chloride (PF)       timolol  1 drop Both Eyes Daily   trihexyphenidyl  1 mg Oral BID WC   Continuous Infusions:  sodium chloride       LOS: 1 day    Time spent: 35 minutes    Dana Allan, MD  [facility] Pager #: [phone] 7PM-7AM contact night coverage as above

## 2021-01-05 DIAGNOSIS — R079 Chest pain, unspecified: Secondary | ICD-10-CM

## 2021-01-05 DIAGNOSIS — R9431 Abnormal electrocardiogram [ECG] [EKG]: Secondary | ICD-10-CM

## 2021-01-05 DIAGNOSIS — K922 Gastrointestinal hemorrhage, unspecified: Secondary | ICD-10-CM | POA: Diagnosis not present

## 2021-01-05 DIAGNOSIS — D62 Acute posthemorrhagic anemia: Secondary | ICD-10-CM | POA: Diagnosis not present

## 2021-01-05 LAB — HEMOGLOBIN AND HEMATOCRIT, BLOOD
HCT: 23.2 % — ABNORMAL LOW (ref 39.0–52.0)
Hemoglobin: 7.6 g/dL — ABNORMAL LOW (ref 13.0–17.0)

## 2021-01-05 LAB — CBC
HCT: 20.3 % — ABNORMAL LOW (ref 39.0–52.0)
HCT: 23.2 % — ABNORMAL LOW (ref 39.0–52.0)
Hemoglobin: 6.5 g/dL — CL (ref 13.0–17.0)
Hemoglobin: 7.7 g/dL — ABNORMAL LOW (ref 13.0–17.0)
MCH: 31.4 pg (ref 26.0–34.0)
MCH: 30.3 pg (ref 26.0–34.0)
MCHC: 32 g/dL (ref 30.0–36.0)
MCHC: 33.2 g/dL (ref 30.0–36.0)
MCV: 98.1 fL (ref 80.0–100.0)
MCV: 91.3 fL (ref 80.0–100.0)
Platelets: 177 10*3/uL (ref 150–400)
Platelets: 145 10*3/uL — ABNORMAL LOW (ref 150–400)
RBC: 2.07 MIL/uL — ABNORMAL LOW (ref 4.22–5.81)
RBC: 2.54 MIL/uL — ABNORMAL LOW (ref 4.22–5.81)
RDW: 12.5 % (ref 11.5–15.5)
RDW: 15.1 % (ref 11.5–15.5)
WBC: 9.1 10*3/uL (ref 4.0–10.5)
WBC: 9 10*3/uL (ref 4.0–10.5)
nRBC: 0 % (ref 0.0–0.2)
nRBC: 0 % (ref 0.0–0.2)

## 2021-01-05 LAB — PREPARE RBC (CROSSMATCH)

## 2021-01-05 LAB — TROPONIN I (HIGH SENSITIVITY)
Troponin I (High Sensitivity): 93 ng/L — ABNORMAL HIGH (ref <18)
Troponin I (High Sensitivity): 110 ng/L (ref <18)

## 2021-01-05 MED ORDER — SODIUM CHLORIDE 0.9% IV SOLUTION: Freq: Once | INTRAVENOUS | Status: AC

## 2021-01-05 MED ORDER — SODIUM CHLORIDE 0.9% IV SOLUTION: Freq: Once | INTRAVENOUS | Status: DC

## 2021-01-05 MED ORDER — SODIUM CHLORIDE 0.9 % IV BOLUS
500.0000 mL | Freq: Once | INTRAVENOUS | Status: AC
  Administered 2021-01-05: 500 mL via INTRAVENOUS

## 2021-01-05 MED ORDER — MORPHINE SULFATE (PF) 2 MG/ML IV SOLN
2.0000 mg | Freq: Once | INTRAVENOUS | Status: DC
Start: 2021-01-05 — End: 2021-01-10

## 2021-01-05 NOTE — Discharge Instructions (Addendum)
After discharge, continue to follow a GI soft / low fiber diet plan  (typically 1-2 weeks). Aim for <13g of fiber per day during this time. Once GI distress has resolved, slowly transition to a high fiber eating pattern by using the tips in the handout to decrease constipation. Ensure you are consume adequate amounts of fluids throughout the day as you increase your fiber intake to prevent cramping and bloating

## 2021-01-05 NOTE — Plan of Care (Signed)
  Problem: Food- and Nutrition-Related Knowledge Deficit (NB-1.1) Goal: Nutrition education Description: Formal process to instruct or train a patient/client in a skill or to impart knowledge to help patients/clients voluntarily manage or modify food choices and eating behavior to maintain or improve health. Outcome: Completed/Met Note: Nutrition Education Note  RD consulted for nutrition education regarding nutrition management for diverticulosis/ Diverticulitis.    Called room phone and spoke with pt's  wife. Wife reports pt is still a little confused at this time and she primarily does the food preparation at home. Discussed normally intake, pt has been following a high fiber diet at home. Wife reports he eats a lot of whole grains, fruit, and beans at baseline.   RD reviewed  "Fiber Restricted Nutrition Therapy" handout as well as "5 Sample Menus for Gradually Increasing Fiber" handout from the [employer]. Explained reasons for pt to follow a low fiber diet over the next 2 weeks to allow for GI rest and easy digestion. Reviewed low fiber foods and high fiber foods. Discussed best practice for long term management of diverticulosis is a high fiber diet and discussed ways to gradually increase fiber in the diet.   Teach back method used. Verbalized understanding of information provided.   Expect excellent compliance.  Body mass index is Body mass index is 22.02 kg/m.[name] Pt meets criteria for normal weight based on current BMI.  Current diet order is GI soft diet, no meal intakes recorded at this time. Labs and medications reviewed. No further nutrition interventions warranted at this time. RD contact information provided. If additional nutrition issues arise, please re-consult RD.  Ranell Patrick, RD, LDN Clinical Dietitian Pager on [place]

## 2021-01-05 NOTE — Progress Notes (Signed)
Chest pain last night with ST segment elevation followed by T wave inversion, but negative troponins, noted.  Discussed with cardiologist.  Patient without any further hematochezia since yesterday.  However, patient's hemoglobin has dropped significantly overnight, 6.5 this morning.  Patient is receiving 1 unit of packed cells (his first since admission).  The patient is slightly confused; the patient's son is at the bedside and corroborates this.  Impression:  1.  Lower GI bleed, probably proximal diverticular, currently quiescent  2.  Posthemorrhagic anemia, severe,, progressive probably due to equilibration  3.  Mild confusional state  Recommendations: Continue current supportive care.  I emphasized to the patient and his son that further bleeding is very possible, almost likely, within the next day or 2.  The criterion for doing a repeat CT angiogram/IR attempt at embolization would be brisk recurrent bleeding.  [name], M.D. Pager [phone] If no answer or after 5 PM call [phone]

## 2021-01-05 NOTE — Consult Note (Signed)
CARDIOLOGY CONSULT NOTE       Patient ID: [name] MRN: [id number] DOB/AGE: 71-09-1950 71 y.o.  Admit date: 01/02/2021 Referring Physician: Buccini Primary Physician: Denita Lung, MD Primary Cardiologist: Ellyn Hack Reason for Consultation: Abnormal ECG  Active Problems:   Acute GI bleeding   GI bleed   HPI:  70 y.o. admitted with acute colonic GI bleed with Hb in 6 range. No documented history of CAD with normal cath in 2005 and stress tests in 2005, 20011 and most recently in 2017 History of HTN, HLD ? Parkinson's.  48 hours of melena with pre syncope and hypotension. Noted to have colonic bleeding on CT but unable to embolize as not active at time of procedure. Has had SSCP thought to be esophageal spasm. Troponins are negative but ECG has new deep T wave inversions in leads V3-V6.  This am he is doing well with no pain stable hemodynamics and getting another transfusion. Nurse notes that after being started on beta blocker , nitrates and ARB his BP bottomed out   ROS All other systems reviewed and negative except as noted above  Past Medical History:  Diagnosis Date   Allergy    Arthritis    BPH (benign prostatic hyperplasia)    Complication of anesthesia    Pt. stated he had a reaction that ended in him requiring urinary cath placement   Diverticulosis    GERD (gastroesophageal reflux disease)    esophageal spasms   Glaucoma    Gout    Head injury, closed, with concussion    Hepatitis C    chronic - Has been treated with Harvoni   HLD (hyperlipidemia)    statin intolerant (Crestor & Simvastatin) - Taking Livalo 1mg  / week   Hypertension    Parkinson's disease ([place])    Plantar fasciitis    right   PVD (peripheral vascular disease) ([place])    With no claudication; only mild abdominal aortic atherosclerosis noted on ultrasound.   Thoracic ascending aortic aneurysm (HCC)    4.2 cm ascending TAA 09/2016 CT, 1 yr f/u rec   Ulcer     Family History  Problem  Relation Age of Onset   Heart disease Mother    Cancer Paternal Grandfather    Cancer Sister     Social History   Socioeconomic History   Marital status: Married    Spouse name: barbra gen   Number of children: 3   Years of education: AS   Highest education level: Not on file  Occupational History   Occupation: self employed    Employer: [employer]    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  Tobacco Use   Smoking status: Former    Pack years: 0.00    Types: Cigars    Quit date: 07/27/1988    Years since quitting: 32.4   Smokeless tobacco: Never  Vaping Use   Vaping Use: Never used  Substance and Sexual Activity   Alcohol use: Yes    Alcohol/week: 3.0 standard drinks    Types: 1 Glasses of wine, 1 Cans of beer, 1 Shots of liquor per week    Comment: occasional   Drug use: No   Sexual activity: Yes  Other Topics Concern   Not on file  Social History Narrative   Lives with wife    Right handed   Drinks 1-2 cups caffeine daily   Social Determinants of Health   Financial Resource Strain: Not on file  Food Insecurity: Not on file  Transportation Needs: Not on file  Physical Activity: Not on file  Stress: Not on file  Social Connections: Not on file  Intimate Partner Violence: Not on file    Past Surgical History:  Procedure Laterality Date   BUNIONECTOMY WITH WEIL OSTEOTOMY Right 11/02/2019   Procedure: Right Foot Lapidus, Modified McBride Bunionectomy,  Hallux Akin Osteotomy;  Surgeon: Wylene Simmer, MD;  Location: [place];  Service: Orthopedics;  Laterality: Right;   CARDIAC CATHETERIZATION  2005   30% Cx. Dr. Melvern Banker   cataract surgery Left 11/05/2014   COLONOSCOPY     ESOPHAGOGASTRODUODENOSCOPY N/A 05/02/2015   Procedure: ESOPHAGOGASTRODUODENOSCOPY (EGD);  Surgeon: Ronald Lobo, MD;  Location: [facility];  Service: Endoscopy;  Laterality: N/A;   ESOPHAGOGASTRODUODENOSCOPY  05/02/2015   no source of pt chest pain endoscopically evident. small hiatal hernia.    ESOPHAGOGASTRODUODENOSCOPY (EGD) WITH PROPOFOL N/A 01/02/2021   Procedure: ESOPHAGOGASTRODUODENOSCOPY (EGD) WITH PROPOFOL;  Surgeon: Clarene Essex, MD;  Location: [facility];  Service: Endoscopy;  Laterality: N/A;   EYE SURGERY     HARDWARE REMOVAL Right 11/02/2019   Procedure: Second Metatarsal Removal of Deep Implant and Rotational Osteotomy;  Surgeon: Wylene Simmer, MD;  Location: [facility];  Service: Orthopedics;  Laterality: Right;   IR ANGIOGRAM SELECTIVE EACH ADDITIONAL VESSEL  01/04/2021   IR ANGIOGRAM SELECTIVE EACH ADDITIONAL VESSEL  01/04/2021   IR ANGIOGRAM VISCERAL SELECTIVE  01/04/2021   IR US GUIDE VASC ACCESS RIGHT  01/04/2021   MEMBRANE PEEL Left 03/14/2014   Procedure: MEMBRANE PEEL; ENDOLASER;  Surgeon: Hurman Horn, MD;  Location: [place];  Service: Ophthalmology;  Laterality: Left;   NM MYOVIEW LTD  2011   Neg Ischemia or infarct.   NM MYOVIEW LTD  10/2015   LOW RISK. Small, fixed basal lateral defect - likely diaphragmatic attenuation. EF 69%   PARS PLANA VITRECTOMY Left 03/14/2014   Procedure: PARS PLANA VITRECTOMY WITH 25 GAUGE;  Surgeon: Hurman Horn, MD;  Location: [facility];  Service: Ophthalmology;  Laterality: Left;   TONSILLECTOMY     TRANSTHORACIC ECHOCARDIOGRAM  2013   Normal EF. No significant Valve Disease   UPPER GASTROINTESTINAL ENDOSCOPY     WEIL OSTEOTOMY Right 09/01/2017   Procedure: RIGHT GREAT TOE CHEVRON AND WEIL OSTEOTOMY 2ND METATARSAL;  Surgeon: Newt Minion, MD;  Location: [place];  Service: Orthopedics;  Laterality: Right;      Current Facility-Administered Medications:    0.9 %  sodium chloride infusion (Manually program via Guardrails IV Fluids), , Intravenous, Once, Zierle-Ghosh, Asia B, DO   acetaminophen (TYLENOL) tablet 500 mg, 500 mg, Oral, Q6H PRN, Clarene Essex, MD, 500 mg at 01/03/21 3419   alfuzosin (UROXATRAL) 24 hr tablet 10 mg, 10 mg, Oral, Daily, Magod, Marc, MD, 10 mg at 01/04/21 3790   bismuth subsalicylate (PEPTO BISMOL)  chewable tablet 524 mg, 524 mg, Oral, PRN, Clarene Essex, MD   carbidopa-levodopa (SINEMET IR) 25-100 MG per tablet immediate release 1.5 tablet, 1.5 tablet, Oral, TID, Clarene Essex, MD, 1.5 tablet at 01/04/21 2138   Chlorhexidine Gluconate Cloth 2 % PADS 6 each, 6 each, Topical, Daily, Ogbata, Sylvester I, MD   citalopram (CELEXA) tablet 20 mg, 20 mg, Oral, Daily, Magod, [name] Dilling, MD, 20 mg at 01/03/21 2409   docusate sodium (COLACE) capsule 200 mg, 200 mg, Oral, Daily, Clarene Essex, MD, 200 mg at 01/03/21 7353   hydrALAZINE (APRESOLINE) injection 10 mg, 10 mg, Intravenous, Once, Dana Allan I, MD   irbesartan (AVAPRO) tablet 300  mg, 300 mg, Oral, Daily, Dana Allan I, MD, 300 mg at 01/04/21 2138   isosorbide mononitrate (IMDUR) 24 hr tablet 30 mg, 30 mg, Oral, Daily, Dana Allan I, MD   MEDLINE mouth rinse, 15 mL, Mouth Rinse, BID, Marthenia Rolling, Babs Bertin, MD   metoprolol tartrate (LOPRESSOR) tablet 50 mg, 50 mg, Oral, BID, Dana Allan I, MD, 50 mg at 01/04/21 2137   morphine 2 MG/ML injection 2 mg, 2 mg, Intravenous, Once, Zierle-Ghosh, Asia B, DO   multivitamin with minerals tablet 1 tablet, 1 tablet, Oral, Daily, Clarene Essex, MD, 1 tablet at 01/03/21 0924   nitroGLYCERIN (NITRODUR - Dosed in mg/24 hr) patch 0.1 mg, 0.1 mg, Transdermal, Daily, Zierle-Ghosh, Asia B, DO, 0.1 mg at 01/04/21 1424   nitroGLYCERIN (NITROSTAT) SL tablet 0.4 mg, 0.4 mg, Sublingual, Q5 min PRN, Zierle-Ghosh, Asia B, DO, 0.4 mg at 01/05/21 0653   pantoprazole (PROTONIX) injection 40 mg, 40 mg, Intravenous, Q12H, Wynetta Fines T, MD, 40 mg at 01/04/21 2137   polyethylene glycol (MIRALAX / GLYCOLAX) packet 17 g, 17 g, Oral, Daily, Silverio Lay, Amanda R, PA-C   simvastatin (ZOCOR) tablet 40 mg, 40 mg, Oral, QHS, Magod, [name] Dilling, MD, 40 mg at 01/04/21 2138   sodium chloride 0.9 % bolus 500 mL, 500 mL, Intravenous, Once, Dana Allan I, MD   timolol (TIMOPTIC) 0.5 % ophthalmic solution 1 drop, 1 drop, Both Eyes, Daily,  Clarene Essex, MD, 1 drop at 01/03/21 4315   trihexyphenidyl (ARTANE) tablet 1 mg, 1 mg, Oral, BID WC, Clarene Essex, MD, 1 mg at 01/05/21 4008  sodium chloride   Intravenous Once   alfuzosin  10 mg Oral Daily   carbidopa-levodopa  1.5 tablet Oral TID   Chlorhexidine Gluconate Cloth  6 each Topical Daily   citalopram  20 mg Oral Daily   docusate sodium  200 mg Oral Daily   hydrALAZINE  10 mg Intravenous Once   irbesartan  300 mg Oral Daily   isosorbide mononitrate  30 mg Oral Daily   mouth rinse  15 mL Mouth Rinse BID   metoprolol tartrate  50 mg Oral BID    morphine injection  2 mg Intravenous Once   multivitamin with minerals  1 tablet Oral Daily   nitroGLYCERIN  0.1 mg Transdermal Daily   pantoprazole (PROTONIX) IV  40 mg Intravenous Q12H   polyethylene glycol  17 g Oral Daily   simvastatin  40 mg Oral QHS   timolol  1 drop Both Eyes Daily   trihexyphenidyl  1 mg Oral BID WC    sodium chloride      Physical Exam: Blood pressure 119/62, pulse 76, temperature 98 F (36.7 C), temperature source Oral, resp. rate 17, height 5\' 10"  (1.778 m), weight 69.6 kg, SpO2 100 %.    Affect appropriate Thin black male  HEENT: normal Neck supple with no adenopathy JVP normal no bruits no thyromegaly Lungs clear with no wheezing and good diaphragmatic motion Heart:  S1/S2 no murmur, no rub, gallop or click PMI normal Abdomen: benighn, BS positve, no tenderness, no AAA no bruit.  No HSM or HJR Distal pulses intact with no bruits No edema Neuro non-focal mild UE tremors worse in left  Skin warm and dry No muscular weakness   Labs:   Lab Results  Component Value Date   WBC 9.1 01/05/2021   HGB 6.5 (LL) 01/05/2021   HCT 20.3 (L) 01/05/2021   MCV 98.1 01/05/2021   PLT 177 01/05/2021    Recent Labs  Lab 01/02/21 1238 01/03/21 0412 01/04/21 0830  NA 133*   < > 135  K 4.0   < > 4.0  CL 104   < > 105  CO2 24   < > 23  BUN 47*   < > 35*  CREATININE 1.18   < > 1.17  CALCIUM 8.0*    < > 8.1*  PROT 5.3*  --   --   BILITOT 0.5  --   --   ALKPHOS 37*  --   --   ALT 8  --   --   AST 13*  --   --   GLUCOSE 95   < > 136*   < > = values in this interval not displayed.   Lab Results  Component Value Date   CKTOTAL 220 09/15/2012    Lab Results  Component Value Date   CHOL 175 09/10/2020   CHOL 148 11/29/2019   CHOL 148 11/25/2018   Lab Results  Component Value Date   HDL 63 09/10/2020   HDL 53 11/29/2019   HDL 49 11/25/2018   Lab Results  Component Value Date   LDLCALC 104 (H) 09/10/2020   LDLCALC 84 11/29/2019   LDLCALC 91 11/25/2018   Lab Results  Component Value Date   TRIG 38 09/10/2020   TRIG 54 11/29/2019   TRIG 39 11/25/2018   Lab Results  Component Value Date   CHOLHDL 2.8 09/10/2020   CHOLHDL 2.8 11/29/2019   CHOLHDL 3.0 11/25/2018   No results found for: LDLDIRECT    Radiology: IR Angiogram Visceral Selective  Result Date: 01/04/2021 INDICATION: 70 year old male with a history of lower GI hemorrhage, isolated to the ascending colon on recent CT angiogram, presents for mesenteric angiogram and possible embolization EXAM: ULTRASOUND-GUIDED ACCESS RIGHT COMMON FEMORAL ARTERY MESENTERIC ANGIOGRAM INVOLVING SMA, SUB SELECTION RIGHT COLIC ARTERY, AND SUPER SELECTIVE ANGIOGRAM OF TERMINAL RIGHT COLIC ARTERY BRANCHES EXOSEAL FOR HEMOSTASIS MEDICATIONS: None ANESTHESIA/SEDATION: Moderate (conscious) sedation was employed during this procedure. A total of Versed 1.0 mg and Fentanyl 25 mcg was administered intravenously. Moderate Sedation Time: 39 minutes. The patient's level of consciousness and vital signs were monitored continuously by radiology nursing throughout the procedure under my direct supervision. CONTRAST:  80 cc FLUOROSCOPY TIME:  Fluoroscopy Time: 5 minutes 36 seconds (2,325 mGy). COMPLICATIONS: None PROCEDURE: Informed consent was obtained from the patient following explanation of the procedure, risks, benefits and alternatives. The patient  understands, agrees and consents for the procedure. All questions were addressed. A time out was performed prior to the initiation of the procedure. Maximal barrier sterile technique utilized including caps, mask, sterile gowns, sterile gloves, large sterile drape, hand hygiene, and Betadine prep. Ultrasound survey of the right inguinal region was performed with images stored and sent to PACs, confirming patency of the vessel. A micropuncture needle was used access the right common femoral artery under ultrasound. With excellent arterial blood flow returned, and an .018 micro wire was passed through the needle, observed enter the abdominal aorta under fluoroscopy. The needle was removed, and a micropuncture sheath was placed over the wire. The inner dilator and wire were removed, and an 035 Bentson wire was advanced under fluoroscopy into the abdominal aorta. The sheath was removed and a standard 5 [place] vascular sheath was placed. The dilator was removed and the sheath was flushed. C2 Cobra catheter advanced on the Bentson wire. Cobra catheter was used to select the SMA. Angiogram was performed. We attempted to advance the C2 catheter  on a standard Glidewire, which was unable given the tortuous SMA takeoff. A coaxial high-flow Renegade 135 cm microcatheter was then advanced on 014 fathom wire into the ileocolic artery. Angiogram was performed. The right colic artery was selected. Angiogram was performed. The microcatheter was then used to select terminal branches of the right colic artery independently, with angiogram performed at each artery in multiple obliquities using both breath hold and free breathing technique. Ultimately, we could not isolate the site of hemorrhage correlating to the findings on the CT angiogram as there was no extravasation of count tract, shunting, or angio dysplasia that was target oval for empiric embolization. All catheters wires were removed and Exoseal was deployed for hemostasis.  Patient tolerated the procedure well and remained hemodynamically stable throughout. No complications were encountered and no significant blood loss. IMPRESSION: Status post ultrasound guided access right common femoral artery for SMA angiogram, super selective angiogram the right colic arteries of interest in attempt to isolate the source of recent lower GI hemorrhage. No embolization was performed. ExoSeal for hemostasis. Signed, Dulcy Fanny. Dellia Nims, RPVI [facility] Radiology Electronically Signed   By: Corrie Mckusick D.O.   On: 01/04/2021 13:01   IR Angiogram Selective Each Additional Vessel  Result Date: 01/04/2021 INDICATION: 70 year old male with a history of lower GI hemorrhage, isolated to the ascending colon on recent CT angiogram, presents for mesenteric angiogram and possible embolization EXAM: ULTRASOUND-GUIDED ACCESS RIGHT COMMON FEMORAL ARTERY MESENTERIC ANGIOGRAM INVOLVING SMA, SUB SELECTION RIGHT COLIC ARTERY, AND SUPER SELECTIVE ANGIOGRAM OF TERMINAL RIGHT COLIC ARTERY BRANCHES EXOSEAL FOR HEMOSTASIS MEDICATIONS: None ANESTHESIA/SEDATION: Moderate (conscious) sedation was employed during this procedure. A total of Versed 1.0 mg and Fentanyl 25 mcg was administered intravenously. Moderate Sedation Time: 39 minutes. The patient's level of consciousness and vital signs were monitored continuously by radiology nursing throughout the procedure under my direct supervision. CONTRAST:  80 cc FLUOROSCOPY TIME:  Fluoroscopy Time: 5 minutes 36 seconds (2,325 mGy). COMPLICATIONS: None PROCEDURE: Informed consent was obtained from the patient following explanation of the procedure, risks, benefits and alternatives. The patient understands, agrees and consents for the procedure. All questions were addressed. A time out was performed prior to the initiation of the procedure. Maximal barrier sterile technique utilized including caps, mask, sterile gowns, sterile  gloves, large sterile drape, hand hygiene, and Betadine prep. Ultrasound survey of the right inguinal region was performed with images stored and sent to PACs, confirming patency of the vessel. A micropuncture needle was used access the right common femoral artery under ultrasound. With excellent arterial blood flow returned, and an .018 micro wire was passed through the needle, observed enter the abdominal aorta under fluoroscopy. The needle was removed, and a micropuncture sheath was placed over the wire. The inner dilator and wire were removed, and an 035 Bentson wire was advanced under fluoroscopy into the abdominal aorta. The sheath was removed and a standard 5 [place] vascular sheath was placed. The dilator was removed and the sheath was flushed. C2 Cobra catheter advanced on the Bentson wire. Cobra catheter was used to select the SMA. Angiogram was performed. We attempted to advance the C2 catheter on a standard Glidewire, which was unable given the tortuous SMA takeoff. A coaxial high-flow Renegade 135 cm microcatheter was then advanced on 014 fathom wire into the ileocolic artery. Angiogram was performed. The right colic artery was selected. Angiogram was performed. The microcatheter was then used to select terminal branches of the right colic artery independently,  with angiogram performed at each artery in multiple obliquities using both breath hold and free breathing technique. Ultimately, we could not isolate the site of hemorrhage correlating to the findings on the CT angiogram as there was no extravasation of count tract, shunting, or angio dysplasia that was target oval for empiric embolization. All catheters wires were removed and Exoseal was deployed for hemostasis. Patient tolerated the procedure well and remained hemodynamically stable throughout. No complications were encountered and no significant blood loss. IMPRESSION: Status post ultrasound guided access right common femoral artery for SMA  angiogram, super selective angiogram the right colic arteries of interest in attempt to isolate the source of recent lower GI hemorrhage. No embolization was performed. ExoSeal for hemostasis. Signed, Dulcy Fanny. Dellia Nims, RPVI [facility] Inter[facility] Radiology Special[facility] Radiology Electronically Signed   By: Corrie Mckusick D.O.   On: 01/04/2021 13:01   IR Angiogram Selective Each Additional Vessel  Result Date: 01/04/2021 INDICATION: 70 year old male with a history of lower GI hemorrhage, isolated to the ascending colon on recent CT angiogram, presents for mesenteric angiogram and possible embolization EXAM: ULTRASOUND-GUIDED ACCESS RIGHT COMMON FEMORAL ARTERY MESENTERIC ANGIOGRAM INVOLVING SMA, SUB SELECTION RIGHT COLIC ARTERY, AND SUPER SELECTIVE ANGIOGRAM OF TERMINAL RIGHT COLIC ARTERY BRANCHES EXOSEAL FOR HEMOSTASIS MEDICATIONS: None ANESTHESIA/SEDATION: Moderate (conscious) sedation was employed during this procedure. A total of Versed 1.0 mg and Fentanyl 25 mcg was administered intravenously. Moderate Sedation Time: 39 minutes. The patient's level of consciousness and vital signs were monitored continuously by radiology nursing throughout the procedure under my direct supervision. CONTRAST:  80 cc FLUOROSCOPY TIME:  Fluoroscopy Time: 5 minutes 36 seconds (2,325 mGy). COMPLICATIONS: None PROCEDURE: Informed consent was obtained from the patient following explanation of the procedure, risks, benefits and alternatives. The patient understands, agrees and consents for the procedure. All questions were addressed. A time out was performed prior to the initiation of the procedure. Maximal barrier sterile technique utilized including caps, mask, sterile gowns, sterile gloves, large sterile drape, hand hygiene, and Betadine prep. Ultrasound survey of the right inguinal region was performed with images stored and sent to PACs, confirming patency of the vessel. A micropuncture needle was used access  the right common femoral artery under ultrasound. With excellent arterial blood flow returned, and an .018 micro wire was passed through the needle, observed enter the abdominal aorta under fluoroscopy. The needle was removed, and a micropuncture sheath was placed over the wire. The inner dilator and wire were removed, and an 035 Bentson wire was advanced under fluoroscopy into the abdominal aorta. The sheath was removed and a standard 5 [place] vascular sheath was placed. The dilator was removed and the sheath was flushed. C2 Cobra catheter advanced on the Bentson wire. Cobra catheter was used to select the SMA. Angiogram was performed. We attempted to advance the C2 catheter on a standard Glidewire, which was unable given the tortuous SMA takeoff. A coaxial high-flow Renegade 135 cm microcatheter was then advanced on 014 fathom wire into the ileocolic artery. Angiogram was performed. The right colic artery was selected. Angiogram was performed. The microcatheter was then used to select terminal branches of the right colic artery independently, with angiogram performed at each artery in multiple obliquities using both breath hold and free breathing technique. Ultimately, we could not isolate the site of hemorrhage correlating to the findings on the CT angiogram as there was no extravasation of count tract, shunting, or angio dysplasia that was target oval for empiric embolization. All catheters wires were  removed and Exoseal was deployed for hemostasis. Patient tolerated the procedure well and remained hemodynamically stable throughout. No complications were encountered and no significant blood loss. IMPRESSION: Status post ultrasound guided access right common femoral artery for SMA angiogram, super selective angiogram the right colic arteries of interest in attempt to isolate the source of recent lower GI hemorrhage. No embolization was performed. ExoSeal for hemostasis. Signed, Dulcy Fanny. Dellia Nims, RPVI [facility]  and Interventional Radiology Specialists [facility] Radiology Electronically Signed   By: Corrie Mckusick D.O.   On: 01/04/2021 13:01   IR US Guide Vasc Access Right  Result Date: 01/04/2021 INDICATION: 70 year old male with a history of lower GI hemorrhage, isolated to the ascending colon on recent CT angiogram, presents for mesenteric angiogram and possible embolization EXAM: ULTRASOUND-GUIDED ACCESS RIGHT COMMON FEMORAL ARTERY MESENTERIC ANGIOGRAM INVOLVING SMA, SUB SELECTION RIGHT COLIC ARTERY, AND SUPER SELECTIVE ANGIOGRAM OF TERMINAL RIGHT COLIC ARTERY BRANCHES EXOSEAL FOR HEMOSTASIS MEDICATIONS: None ANESTHESIA/SEDATION: Moderate (conscious) sedation was employed during this procedure. A total of Versed 1.0 mg and Fentanyl 25 mcg was administered intravenously. Moderate Sedation Time: 39 minutes. The patient's level of consciousness and vital signs were monitored continuously by radiology nursing throughout the procedure under my direct supervision. CONTRAST:  80 cc FLUOROSCOPY TIME:  Fluoroscopy Time: 5 minutes 36 seconds (2,325 mGy). COMPLICATIONS: None PROCEDURE: Informed consent was obtained from the patient following explanation of the procedure, risks, benefits and alternatives. The patient understands, agrees and consents for the procedure. All questions were addressed. A time out was performed prior to the initiation of the procedure. Maximal barrier sterile technique utilized including caps, mask, sterile gowns, sterile gloves, large sterile drape, hand hygiene, and Betadine prep. Ultrasound survey of the right inguinal region was performed with images stored and sent to PACs, confirming patency of the vessel. A micropuncture needle was used access the right common femoral artery under ultrasound. With excellent arterial blood flow returned, and an .018 micro wire was passed through the needle, observed enter the abdominal aorta under fluoroscopy. The needle was removed, and a micropuncture sheath  was placed over the wire. The inner dilator and wire were removed, and an 035 Bentson wire was advanced under fluoroscopy into the abdominal aorta. The sheath was removed and a standard 5 [place] vascular sheath was placed. The dilator was removed and the sheath was flushed. C2 Cobra catheter advanced on the Bentson wire. Cobra catheter was used to select the SMA. Angiogram was performed. We attempted to advance the C2 catheter on a standard Glidewire, which was unable given the tortuous SMA takeoff. A coaxial high-flow Renegade 135 cm microcatheter was then advanced on 014 fathom wire into the ileocolic artery. Angiogram was performed. The right colic artery was selected. Angiogram was performed. The microcatheter was then used to select terminal branches of the right colic artery independently, with angiogram performed at each artery in multiple obliquities using both breath hold and free breathing technique. Ultimately, we could not isolate the site of hemorrhage correlating to the findings on the CT angiogram as there was no extravasation of count tract, shunting, or angio dysplasia that was target oval for empiric embolization. All catheters wires were removed and Exoseal was deployed for hemostasis. Patient tolerated the procedure well and remained hemodynamically stable throughout. No complications were encountered and no significant blood loss. IMPRESSION: Status post ultrasound guided access right common femoral artery for SMA angiogram, super selective angiogram the right colic arteries of interest in attempt to isolate the source of recent lower  GI hemorrhage. No embolization was performed. ExoSeal for hemostasis. Signed, Dulcy Fanny. Dellia Nims, RPVI [facility] Electronically Signed   By: Corrie Mckusick D.O.   On: 01/04/2021 13:01   OCT, Retina - OU - Both Eyes  Result Date: 01/02/2021 Right Eye Quality was good. Scan locations included subfoveal.  Central Foveal Thickness: 205. Findings include outer retinal atrophy, central retinal atrophy. Left Eye Quality was good. Scan locations included subfoveal. Central Foveal Thickness: 381. Progression has been stable. Notes Diffuse macular atrophy right eye no interval change Mild diffuse macular thickening left eye with no active CME.  No change in foveal elevation over the last 2 years. Minor CME superonasal, but this is old and involutional and unchanged for years with no encroachment to center of the fovea  Color Fundus Photography Optos - OU - Both Eyes  Result Date: 01/02/2021 Right Eye Progression has been stable. Notes OD with cloudy media secondary to corneal opacity, striae from corneal edema Retina attached, optic nerve atrophy good PRP peripheral OS, mild temporal optic atrophy. 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, no active macular edema  VAS US CAROTID  Result Date: 01/02/2021 Carotid Arterial Duplex Study Patient Name:  KAELEB EMOND  Date of Exam:   01/01/2021 Medical Rec #: [id number]        Accession #:    [id number] Date of Birth: [birth year]        Patient Gender: M Patient Age:   33Y Exam Location:  [facility] Procedure:      VAS US CAROTID Referring Phys: [id number] DAVID W HARDING --------------------------------------------------------------------------------  Indications:       Patient reports intermittent tingling sensations in the left                    jaw area, across the left chest area at surround the front                    area of the left knee. Symptoms do not always come in all                    areas at the same time. These symptoms have been going on for                    one to two months. He also states he has been having                    headaches for the past week but relates it to a new                    medication he is taking (could not recall the name). 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 Lightheadedness when he has to look                    over the right shoulder while driving. He denies any other                    cerebrovascular symptoms. Risk Factors:      Hyperlipidemia, past history of smoking. Comparison Study:  NA Performing Technologist: Sharlett Iles RVT  Examination Guidelines: A complete evaluation includes B-mode imaging, spectral Doppler, color Doppler, and power Doppler as needed of all accessible portions of each vessel. Bilateral testing is considered an integral part of a complete examination. Limited examinations for reoccurring indications may be performed as noted.  Right Carotid Findings: +----------+--------+--------+--------+------------------+--------+           PSV cm/sEDV cm/sStenosisPlaque DescriptionComments +----------+--------+--------+--------+------------------+--------+ CCA Prox  53      6                                 tortuous +----------+--------+--------+--------+------------------+--------+ CCA Distal57      13                                         +----------+--------+--------+--------+------------------+--------+ ICA Prox  43      10      1-39%   heterogenous               +----------+--------+--------+--------+------------------+--------+ ICA Mid   41      17              heterogenous               +----------+--------+--------+--------+------------------+--------+ ICA Distal35      17                                         +----------+--------+--------+--------+------------------+--------+ ECA       77      10                                         +----------+--------+--------+--------+------------------+--------+ +----------+--------+-------+---------+-------------------+           PSV cm/sEDV cmsDescribe Arm Pressure (mmHG) +----------+--------+-------+---------+-------------------+ [id number]            Turbulent117                  +----------+--------+-------+---------+-------------------+ +---------+--------+--+--------+-+---------------------------+ VertebralPSV cm/s26EDV cm/s6Antegrade and Small caliber +---------+--------+--+--------+-+---------------------------+  Left Carotid Findings: +----------+--------+--------+--------+------------------+--------+           PSV cm/sEDV cm/sStenosisPlaque DescriptionComments +----------+--------+--------+--------+------------------+--------+ CCA Prox  81      14                                         +----------+--------+--------+--------+------------------+--------+ CCA Distal78      15                                         +----------+--------+--------+--------+------------------+--------+ ICA Prox  101     33      1-39%   heterogenous               +----------+--------+--------+--------+------------------+--------+ ICA Mid   49      17                                         +----------+--------+--------+--------+------------------+--------+ ICA Distal52  24                                         +----------+--------+--------+--------+------------------+--------+ ECA       81      10                                         +----------+--------+--------+--------+------------------+--------+ +----------+--------+--------+----------------+-------------------+           PSV cm/sEDV cm/sDescribe        Arm Pressure (mmHG) +----------+--------+--------+----------------+-------------------+ Subclavian160             Multiphasic, NIO270                 +----------+--------+--------+----------------+-------------------+ +---------+--------+--+--------+--+---------+ VertebralPSV cm/s37EDV cm/s13Antegrade +---------+--------+--+--------+--+---------+   Summary: Right Carotid: Velocities in the right ICA are consistent with a 1-39% stenosis. Left Carotid: Velocities in the left ICA are consistent with a 1-39% stenosis.  Vertebrals:  Bilateral vertebral arteries demonstrate antegrade flow. Small              caliber right vertebral artery. Subclavians: Right subclavian artery flow was disturbed. Normal flow              hemodynamics were seen in the left subclavian artery. *See table(s) above for measurements and observations.  Electronically signed by Larae Grooms MD on 01/02/2021 at 2:23:23 PM.    Final    CT Angio Abd/Pel w/ and/or w/o  Result Date: 01/04/2021 CLINICAL DATA:  70 year old male with melena and maroon colored stools. Evaluate for active GI bleed. EXAM: CTA ABDOMEN AND PELVIS WITHOUT AND WITH CONTRAST TECHNIQUE: Multidetector CT imaging of the abdomen and pelvis was performed using the standard protocol during bolus administration of intravenous contrast. Multiplanar reconstructed images and MIPs were obtained and reviewed to evaluate the vascular anatomy. CONTRAST:  121mL OMNIPAQUE IOHEXOL 350 MG/ML SOLN COMPARISON:  Prior CT abdomen/pelvis 04/18/2018; CT chest 10/08/2016 FINDINGS: VASCULAR Aorta: Scattered atherosclerotic vascular calcifications throughout the abdominal aorta. No evidence of aneurysm or dissection. Celiac: Patent without evidence of aneurysm, dissection, vasculitis or significant stenosis. SMA: Patent without evidence of aneurysm, dissection, vasculitis or significant stenosis. Renals: Mild stenosis at the origin of the right renal artery secondary to fibrofatty atherosclerotic plaque. Left renal artery is widely patent. No accessory renal arteries. IMA: Focal moderate to high-grade stenosis at the origin. The remainder of the artery is patent. Inflow: The iliac arteries are highly tortuous. Scattered atherosclerotic calcifications without significant stenosis, occlusion, dissection or aneurysm. Proximal Outflow: Bilateral common femoral and visualized portions of the superficial and profunda femoral arteries are patent without evidence of aneurysm, dissection, vasculitis or significant  stenosis. Veins: No focal venous abnormality. Review of the MIP images confirms the above findings. NON-VASCULAR Lower chest: No acute abnormality. Greater than 2 year stability of small pulmonary nodules (images 17 and 28 of series 10) consistent with benign granuloma. Trace dependent atelectasis. The visualized heart is normal in size. No pericardial effusion. No significant abnormality of the visualized distal thoracic esophagus. Hepatobiliary: No focal liver abnormality is seen. No gallstones, gallbladder wall thickening, or biliary dilatation. Pancreas: Unremarkable. No pancreatic ductal dilatation or surrounding inflammatory changes. Spleen: Normal in size without focal abnormality. Adrenals/Urinary Tract: The adrenal glands are normal. No hydronephrosis, nephrolithiasis or enhancing renal mass. Large simple cysts exophytic from the upper pole of  the right kidney in the anterior interpolar left kidney. The ureters and bladder are unremarkable. Stomach/Bowel: Active extravasation of arterial phase contrast into a diverticulum of the ascending colon just proximal to the hepatic flexure (images 80-94) with evidence of blooming and expansion of the contrast blush on the delayed phase images. Findings are consistent with acute diverticular hemorrhage. Stomach and proximal small bowel are within normal limits. No focal bowel wall thickening or evidence of bowel obstruction. Lymphatic: No suspicious lymphadenopathy. Reproductive: Prostatomegaly. Other: Fat containing umbilical hernia.  No evidence of ascites. Musculoskeletal: No acute fracture or aggressive appearing lytic or blastic osseous lesion. Mild grade 1 anterolisthesis of L4 on L5 without spondylo lysis. Associated lower lumbar degenerative disc disease and facet arthropathy. IMPRESSION: 1. Positive for active diverticular bleed in the ascending colon just proximal to the hepatic flexure. 2. Scattered atherosclerotic vascular calcifications without aneurysm,  dissection or occlusion. Aortic Atherosclerosis (ICD10-I70.0). 3. Progressive lower lumbar degenerative disc disease and facet arthropathy now with mild grade 1 anterolisthesis of L4 on L5. 4. Additional ancillary findings as above without significant interval change compared to prior imaging. Electronically Signed   By: Jacqulynn Cadet M.D.   On: 01/04/2021 09:38    EKG: SR deep T wave inversions in leads V3-V6   ASSESSMENT AND PLAN:   Abnormal ECG/Chest Pain:  not clear if this is esophageal spasm or angina. If angina precipitated by anemia. Certatinly not a candidate for any evaluation at this time with active bleeding Transfuse to Hb 10. Check TTE for EF and anterior wall motion abnormality Ok to continue beta blocker and nitrates but d/c Avapro with low BP Trend troponins which have been low so far Last non ischemic myovue was in 2017 HTN:  see above d/c Avapro Parkinsons :  continue Sinemet  GI:  per Dr Cristina Gong transfuse ? Colonoscopy if IR cannot embolize source   Signed: Jenkins Rouge 01/05/2021, 8:35 AM

## 2021-01-05 NOTE — Progress Notes (Signed)
PROGRESS NOTE    [name]  XTK:[id number] DOB: Sep 29, 1950 DOA: 01/02/2021 PCP: Denita Lung, MD  Outpatient Specialists:   Brief Narrative:  Patient is a 70 year old African-American male with past medical history significant for Parkinson disease, BPH, HTN, HLD and GI bleed.  Patient was admitted with GI bleed.  Patient underwent EGD that did not reveal source of GI bleed.  Patient continued to have intermittent bleeding during the hospital stay and the etiology of the bleeding was thought to be diverticulosis.  Overnight, patient has had intermittent rectal bleeding and, as per collateral information, had hypotensive episode with brief period of syncope.  Patient underwent CT angiography of the abdomen and pelvis that localized to bleed into the ascending colon.  Interventional radiology team was consulted for possible angiogram and embolization.  The procedure was not successful as the patient was not having significant bleeding during the procedure.  Recent drop in H/H noted.  GI team is directing care.  CBC will be monitored every 12 hours.  Patient is also on low-dose MiraLAX to help clear the GI bleed.  Further management will depend on hospital course.  We will also have a low threshold to consult the surgical team.  01/05/2021: Patient seen.  Events of last night noted.  Patient was said to have had chest pain with associated ST segment changes at V4 and V5.  Cardiology team has been consulted.  Patient is currently being transfused with packed red blood cells.  Hemoglobin dropped to 6.5 g/dL.  No further bleeding reported overnight.  Patient actually looks much better today.  No further chest pain when patient was seen.  GI input is highly appreciated.   Assessment & Plan:   Active Problems:   Acute GI bleeding   GI bleed   GI bleed: -EGD was nonrevealing.   -CT angiogram for localized bleeding to the ascending colon. -Angiogram and embolization was not successful as of no  significant bleeding during the procedure. -CBC is currently being monitored every 12 hours. -GI team is directing care.   -We will have a low threshold to consult the surgical team. 01/05/2021: Continue to monitor H/H.    Patient is currently being transfused with packed red blood cells.  Acute blood loss anemia: -Secondary to diverticular bleed. -See above documentation. -Patient's hemoglobin was around 14 g/dL about 4 months ago.  On presentation, patient's hemoglobin was around 9 g/dL. -Hemoglobin dropped to 7.9 g/dL overnight. -We will continue to monitor CBC.   Hypertension/hypotension:  -Patient was actually hypotensive at some point last night necessitating volume resuscitation. -Continue to monitor closely.    Parkinson disease -Continue Sinemet   DVT prophylaxis: SCD. Code Status: Full code Family Communication: Wife Disposition Plan: Home eventually   Consultants:  Gastroenterology. Interventional radiology.  Procedures:  EGD. Unsuccessful angiogram and embolization.    Antimicrobials:  None.   Subjective: No further bleeding overnight. No chest pain No shortness of breath  Objective: Vitals:   01/05/21 1100 01/05/21 1200 01/05/21 1205 01/05/21 1321  BP: 112/76 (!) 80/44 (!) 76/40 (!) 99/54  Pulse: 72 65 64 67  Resp: 16 17 15 18   Temp:  98.2 F (36.8 C)  98 F (36.7 C)  TempSrc:  Oral  Oral  SpO2: 100% 100% 100% 100%  Weight:      Height:        Intake/Output Summary (Last 24 hours) at 01/05/2021 1336 Last data filed at 01/05/2021 1000 Gross per 24 hour  Intake 900 ml  Output 1075 ml  Net -175 ml    Filed Weights   01/02/21 1213 01/02/21 2017  Weight: 72.6 kg 69.6 kg    Examination:  General exam: Appears calm and comfortable.  Patient is pale. Respiratory system: Clear to auscultation.  Cardiovascular system: S1 & S2 heard, Gastrointestinal system: Abdomen is nondistended, soft and nontender. No organomegaly or masses felt. Normal  bowel sounds heard. Central nervous system: Alert and oriented.  Patient moves all extremities.  Tremors. Extremities: No leg edema.  Data Reviewed: I have personally reviewed following labs and imaging studies  CBC: Recent Labs  Lab 01/03/21 0412 01/04/21 0011 01/04/21 0831 01/04/21 1523 01/05/21 0323 01/05/21 1140  WBC 7.5 6.1 9.2 9.8 9.1  --   NEUTROABS  --   --  6.1  --   --   --   HGB 9.9* 9.5* 7.9* 8.1* 6.5* 7.6*  HCT 30.3* 28.0* 24.0* 26.8* 20.3* 23.2*  MCV 96.2 94.6 95.2 105.9* 98.1  --   PLT 213 202 221 236 177  --     Basic Metabolic Panel: Recent Labs  Lab 01/02/21 1238 01/03/21 0412 01/04/21 0011 01/04/21 0830  NA 133* 135 133* 135  K 4.0 4.1 3.9 4.0  CL 104 104 103 105  CO2 24 26 24 23   GLUCOSE 95 77 119* 136*  BUN 47* 32* 33* 35*  CREATININE 1.18 0.94 1.06 1.17  CALCIUM 8.0* 8.5* 8.2* 8.1*    GFR: Estimated Creatinine Clearance: 57 mL/min (by C-G formula based on SCr of 1.17 mg/dL). Liver Function Tests: Recent Labs  Lab 01/02/21 1238  AST 13*  ALT 8  ALKPHOS 37*  BILITOT 0.5  PROT 5.3*  ALBUMIN 3.1*    No results for input(s): LIPASE, AMYLASE in the last 168 hours. No results for input(s): AMMONIA in the last 168 hours. Coagulation Profile: No results for input(s): INR, PROTIME in the last 168 hours. Cardiac Enzymes: No results for input(s): CKTOTAL, CKMB, CKMBINDEX, TROPONINI in the last 168 hours. BNP (last 3 results) No results for input(s): PROBNP in the last 8760 hours. HbA1C: No results for input(s): HGBA1C in the last 72 hours. CBG: No results for input(s): GLUCAP in the last 168 hours. Lipid Profile: No results for input(s): CHOL, HDL, LDLCALC, TRIG, CHOLHDL, LDLDIRECT in the last 72 hours. Thyroid Function Tests: No results for input(s): TSH, T4TOTAL, FREET4, T3FREE, THYROIDAB in the last 72 hours. Anemia Panel: No results for input(s): VITAMINB12, FOLATE, FERRITIN, TIBC, IRON, RETICCTPCT in the last 72 hours. Urine  analysis:    Component Value Date/Time   COLORURINE YELLOW 04/18/2018 1019   APPEARANCEUR CLEAR 04/18/2018 1019   LABSPEC 1.010 01/17/2020 1502   PHURINE 7.0 04/18/2018 1019   GLUCOSEU NEGATIVE 04/18/2018 1019   HGBUR NEGATIVE 04/18/2018 1019   BILIRUBINUR negative 01/17/2020 1502   KETONESUR negative 01/17/2020 1502   KETONESUR NEGATIVE 04/18/2018 1019   PROTEINUR negative 01/17/2020 1502   PROTEINUR NEGATIVE 04/18/2018 1019   UROBILINOGEN 0.2 03/14/2014 2107   NITRITE Negative 01/17/2020 1502   NITRITE NEGATIVE 04/18/2018 1019   LEUKOCYTESUR Negative 01/17/2020 1502   Sepsis Labs: @LABRCNTIP (procalcitonin:4,lacticidven:4)  ) Recent Results (from the past 240 hour(s))  Resp Panel by RT-PCR (Flu A&B, Covid) Nasopharyngeal Swab     Status: None   Collection Time: 01/02/21  3:45 PM   Specimen: Nasopharyngeal Swab; Nasopharyngeal(NP) swabs in vial transport medium  Result Value Ref Range Status   SARS Coronavirus 2 by RT PCR NEGATIVE NEGATIVE Final    Comment: (NOTE)  SARS-CoV-2 target nucleic acids are NOT DETECTED.  The SARS-CoV-2 RNA is generally detectable in upper respiratory specimens during the acute phase of infection. The lowest concentration of SARS-CoV-2 viral copies this assay can detect is 138 copies/mL. A negative result does not preclude SARS-Cov-2 infection and should not be used as the sole basis for treatment or other patient management decisions. A negative result may occur with  improper specimen collection/handling, submission of specimen other than nasopharyngeal swab, presence of viral mutation(s) within the areas targeted by this assay, and inadequate number of viral copies(<138 copies/mL). A negative result must be combined with clinical observations, patient history, and epidemiological information. The expected result is Negative.  Fact Sheet for Patients:  EntrepreneurPulse.com.au  Fact Sheet for Healthcare Providers:   IncredibleEmployment.be  This test is no t yet approved or cleared by the [place] FDA and  has been authorized for detection and/or diagnosis of SARS-CoV-2 by FDA under an Emergency Use Authorization (EUA). This EUA will remain  in effect (meaning this test can be used) for the duration of the COVID-19 declaration under Section 564(b)(1) of the Act, 21 U.S.C.section 360bbb-3(b)(1), unless the authorization is terminated  or revoked sooner.       Influenza A by PCR NEGATIVE NEGATIVE Final   Influenza B by PCR NEGATIVE NEGATIVE Final    Comment: (NOTE) The Xpert Xpress SARS-CoV-2/FLU/RSV plus assay is intended as an aid in the diagnosis of influenza from Nasopharyngeal swab specimens and should not be used as a sole basis for treatment. Nasal washings and aspirates are unacceptable for Xpert Xpress SARS-CoV-2/FLU/RSV testing.  Fact Sheet for Patients: EntrepreneurPulse.com.au  Fact Sheet for Healthcare Providers: IncredibleEmployment.be  This test is not yet approved or cleared by the [place] FDA and has been authorized for detection and/or diagnosis of SARS-CoV-2 by FDA under an Emergency Use Authorization (EUA). This EUA will remain in effect (meaning this test can be used) for the duration of the COVID-19 declaration under Section 564(b)(1) of the Act, 21 U.S.C. section 360bbb-3(b)(1), unless the authorization is terminated or revoked.  Performed at [facility], [place] [address] [place], [place] [address]   MRSA PCR Screening     Status: None   Collection Time: 01/04/21 11:52 AM   Specimen: Nasal Mucosa; Nasopharyngeal  Result Value Ref Range Status   MRSA by PCR NEGATIVE NEGATIVE Final    Comment:        The GeneXpert MRSA Assay (FDA approved for NASAL specimens only), is one component of a comprehensive MRSA colonization surveillance program. It is not intended to diagnose  MRSA infection nor to guide or monitor treatment for MRSA infections. Performed at [facility], [place] [address], [place], [place] [address]          Radiology Studies: IR Angiogram Visceral Selective  Result Date: 01/04/2021 INDICATION: 70 year old male with a history of lower GI hemorrhage, isolated to the ascending colon on recent CT angiogram, presents for mesenteric angiogram and possible embolization EXAM: ULTRASOUND-GUIDED ACCESS RIGHT COMMON FEMORAL ARTERY MESENTERIC ANGIOGRAM INVOLVING SMA, SUB SELECTION RIGHT COLIC ARTERY, AND SUPER SELECTIVE ANGIOGRAM OF TERMINAL RIGHT COLIC ARTERY BRANCHES EXOSEAL FOR HEMOSTASIS MEDICATIONS: None ANESTHESIA/SEDATION: Moderate (conscious) sedation was employed during this procedure. A total of Versed 1.0 mg and Fentanyl 25 mcg was administered intravenously. Moderate Sedation Time: 39 minutes. The patient's level of consciousness and vital signs were monitored continuously by radiology nursing throughout the procedure under my direct supervision. CONTRAST:  80 cc FLUOROSCOPY TIME:  Fluoroscopy  Time: 5 minutes 36 seconds (2,325 mGy). COMPLICATIONS: None PROCEDURE: Informed consent was obtained from the patient following explanation of the procedure, risks, benefits and alternatives. The patient understands, agrees and consents for the procedure. All questions were addressed. A time out was performed prior to the initiation of the procedure. Maximal barrier sterile technique utilized including caps, mask, sterile gowns, sterile gloves, large sterile drape, hand hygiene, and Betadine prep. Ultrasound survey of the right inguinal region was performed with images stored and sent to PACs, confirming patency of the vessel. A micropuncture needle was used access the right common femoral artery under ultrasound. With excellent arterial blood flow returned, and an .018 micro wire was passed through the needle, observed enter the abdominal aorta  under fluoroscopy. The needle was removed, and a micropuncture sheath was placed over the wire. The inner dilator and wire were removed, and an 035 Bentson wire was advanced under fluoroscopy into the abdominal aorta. The sheath was removed and a standard 5 [place] vascular sheath was placed. The dilator was removed and the sheath was flushed. C2 Cobra catheter advanced on the Bentson wire. Cobra catheter was used to select the SMA. Angiogram was performed. We attempted to advance the C2 catheter on a standard Glidewire, which was unable given the tortuous SMA takeoff. A coaxial high-flow Renegade 135 cm microcatheter was then advanced on 014 fathom wire into the ileocolic artery. Angiogram was performed. The right colic artery was selected. Angiogram was performed. The microcatheter was then used to select terminal branches of the right colic artery independently, with angiogram performed at each artery in multiple obliquities using both breath hold and free breathing technique. Ultimately, we could not isolate the site of hemorrhage correlating to the findings on the CT angiogram as there was no extravasation of count tract, shunting, or angio dysplasia that was target oval for empiric embolization. All catheters wires were removed and Exoseal was deployed for hemostasis. Patient tolerated the procedure well and remained hemodynamically stable throughout. No complications were encountered and no significant blood loss. IMPRESSION: Status post ultrasound guided access right common femoral artery for SMA angiogram, super selective angiogram the right colic arteries of interest in attempt to isolate the source of recent lower GI hemorrhage. No embolization was performed. ExoSeal for hemostasis. Signed, Dulcy Fanny. Dellia Nims, RPVI Vascular and Interventional Radiology Specialists [facility] Radiology Electronically Signed   By: Corrie Mckusick D.O.   On: 01/04/2021 13:01   IR Angiogram Selective Each Additional  Vessel  Result Date: 01/04/2021 INDICATION: 70 year old male with a history of lower GI hemorrhage, isolated to the ascending colon on recent CT angiogram, presents for mesenteric angiogram and possible embolization EXAM: ULTRASOUND-GUIDED ACCESS RIGHT COMMON FEMORAL ARTERY MESENTERIC ANGIOGRAM INVOLVING SMA, SUB SELECTION RIGHT COLIC ARTERY, AND SUPER SELECTIVE ANGIOGRAM OF TERMINAL RIGHT COLIC ARTERY BRANCHES EXOSEAL FOR HEMOSTASIS MEDICATIONS: None ANESTHESIA/SEDATION: Moderate (conscious) sedation was employed during this procedure. A total of Versed 1.0 mg and Fentanyl 25 mcg was administered intravenously. Moderate Sedation Time: 39 minutes. The patient's level of consciousness and vital signs were monitored continuously by radiology nursing throughout the procedure under my direct supervision. CONTRAST:  80 cc FLUOROSCOPY TIME:  Fluoroscopy Time: 5 minutes 36 seconds (2,325 mGy). COMPLICATIONS: None PROCEDURE: Informed consent was obtained from the patient following explanation of the procedure, risks, benefits and alternatives. The patient understands, agrees and consents for the procedure. All questions were addressed. A time out was performed prior to the initiation of the procedure. Maximal barrier sterile technique utilized including  caps, mask, sterile gowns, sterile gloves, large sterile drape, hand hygiene, and Betadine prep. Ultrasound survey of the right inguinal region was performed with images stored and sent to PACs, confirming patency of the vessel. A micropuncture needle was used access the right common femoral artery under ultrasound. With excellent arterial blood flow returned, and an .018 micro wire was passed through the needle, observed enter the abdominal aorta under fluoroscopy. The needle was removed, and a micropuncture sheath was placed over the wire. The inner dilator and wire were removed, and an 035 Bentson wire was advanced under fluoroscopy into the abdominal aorta. The sheath  was removed and a standard 5 [place] vascular sheath was placed. The dilator was removed and the sheath was flushed. C2 Cobra catheter advanced on the Bentson wire. Cobra catheter was used to select the SMA. Angiogram was performed. We attempted to advance the C2 catheter on a standard Glidewire, which was unable given the tortuous SMA takeoff. A coaxial high-flow Renegade 135 cm microcatheter was then advanced on 014 fathom wire into the ileocolic artery. Angiogram was performed. The right colic artery was selected. Angiogram was performed. The microcatheter was then used to select terminal branches of the right colic artery independently, with angiogram performed at each artery in multiple obliquities using both breath hold and free breathing technique. Ultimately, we could not isolate the site of hemorrhage correlating to the findings on the CT angiogram as there was no extravasation of count tract, shunting, or angio dysplasia that was target oval for empiric embolization. All catheters wires were removed and Exoseal was deployed for hemostasis. Patient tolerated the procedure well and remained hemodynamically stable throughout. No complications were encountered and no significant blood loss. IMPRESSION: Status post ultrasound guided access right common femoral artery for SMA angiogram, super selective angiogram the right colic arteries of interest in attempt to isolate the source of recent lower GI hemorrhage. No embolization was performed. ExoSeal for hemostasis. Signed, Dulcy Fanny. Dellia Nims, RPVI Vascular and Interventional Radiology Specialists [facility] Radiology Electronically Signed   By: Corrie Mckusick D.O.   On: 01/04/2021 13:01   IR Angiogram Selective Each Additional Vessel  Result Date: 01/04/2021 INDICATION: 70 year old male with a history of lower GI hemorrhage, isolated to the ascending colon on recent CT angiogram, presents for mesenteric angiogram and possible embolization EXAM:  ULTRASOUND-GUIDED ACCESS RIGHT COMMON FEMORAL ARTERY MESENTERIC ANGIOGRAM INVOLVING SMA, SUB SELECTION RIGHT COLIC ARTERY, AND SUPER SELECTIVE ANGIOGRAM OF TERMINAL RIGHT COLIC ARTERY BRANCHES EXOSEAL FOR HEMOSTASIS MEDICATIONS: None ANESTHESIA/SEDATION: Moderate (conscious) sedation was employed during this procedure. A total of Versed 1.0 mg and Fentanyl 25 mcg was administered intravenously. Moderate Sedation Time: 39 minutes. The patient's level of consciousness and vital signs were monitored continuously by radiology nursing throughout the procedure under my direct supervision. CONTRAST:  80 cc FLUOROSCOPY TIME:  Fluoroscopy Time: 5 minutes 36 seconds (2,325 mGy). COMPLICATIONS: None PROCEDURE: Informed consent was obtained from the patient following explanation of the procedure, risks, benefits and alternatives. The patient understands, agrees and consents for the procedure. All questions were addressed. A time out was performed prior to the initiation of the procedure. Maximal barrier sterile technique utilized including caps, mask, sterile gowns, sterile gloves, large sterile drape, hand hygiene, and Betadine prep. Ultrasound survey of the right inguinal region was performed with images stored and sent to PACs, confirming patency of the vessel. A micropuncture needle was used access the right common femoral artery under ultrasound. With excellent arterial blood flow returned, and an .018  micro wire was passed through the needle, observed enter the abdominal aorta under fluoroscopy. The needle was removed, and a micropuncture sheath was placed over the wire. The inner dilator and wire were removed, and an 035 Bentson wire was advanced under fluoroscopy into the abdominal aorta. The sheath was removed and a standard 5 [place] vascular sheath was placed. The dilator was removed and the sheath was flushed. C2 Cobra catheter advanced on the Bentson wire. Cobra catheter was used to select the SMA. Angiogram was  performed. We attempted to advance the C2 catheter on a standard Glidewire, which was unable given the tortuous SMA takeoff. A coaxial high-flow Renegade 135 cm microcatheter was then advanced on 014 fathom wire into the ileocolic artery. Angiogram was performed. The right colic artery was selected. Angiogram was performed. The microcatheter was then used to select terminal branches of the right colic artery independently, with angiogram performed at each artery in multiple obliquities using both breath hold and free breathing technique. Ultimately, we could not isolate the site of hemorrhage correlating to the findings on the CT angiogram as there was no extravasation of count tract, shunting, or angio dysplasia that was target oval for empiric embolization. All catheters wires were removed and Exoseal was deployed for hemostasis. Patient tolerated the procedure well and remained hemodynamically stable throughout. No complications were encountered and no significant blood loss. IMPRESSION: Status post ultrasound guided access right common femoral artery for SMA angiogram, super selective angiogram the right colic arteries of interest in attempt to isolate the source of recent lower GI hemorrhage. No embolization was performed. ExoSeal for hemostasis. Signed, Dulcy Fanny. Dellia Nims, RPVI Vascular and Interventional Radiology Specialists [facility] Radiology Electronically Signed   By: Corrie Mckusick D.O.   On: 01/04/2021 13:01   IR US Guide Vasc Access Right  Result Date: 01/04/2021 INDICATION: 70 year old male with a history of lower GI hemorrhage, isolated to the ascending colon on recent CT angiogram, presents for mesenteric angiogram and possible embolization EXAM: ULTRASOUND-GUIDED ACCESS RIGHT COMMON FEMORAL ARTERY MESENTERIC ANGIOGRAM INVOLVING SMA, SUB SELECTION RIGHT COLIC ARTERY, AND SUPER SELECTIVE ANGIOGRAM OF TERMINAL RIGHT COLIC ARTERY BRANCHES EXOSEAL FOR HEMOSTASIS MEDICATIONS: None  ANESTHESIA/SEDATION: Moderate (conscious) sedation was employed during this procedure. A total of Versed 1.0 mg and Fentanyl 25 mcg was administered intravenously. Moderate Sedation Time: 39 minutes. The patient's level of consciousness and vital signs were monitored continuously by radiology nursing throughout the procedure under my direct supervision. CONTRAST:  80 cc FLUOROSCOPY TIME:  Fluoroscopy Time: 5 minutes 36 seconds (2,325 mGy). COMPLICATIONS: None PROCEDURE: Informed consent was obtained from the patient following explanation of the procedure, risks, benefits and alternatives. The patient understands, agrees and consents for the procedure. All questions were addressed. A time out was performed prior to the initiation of the procedure. Maximal barrier sterile technique utilized including caps, mask, sterile gowns, sterile gloves, large sterile drape, hand hygiene, and Betadine prep. Ultrasound survey of the right inguinal region was performed with images stored and sent to PACs, confirming patency of the vessel. A micropuncture needle was used access the right common femoral artery under ultrasound. With excellent arterial blood flow returned, and an .018 micro wire was passed through the needle, observed enter the abdominal aorta under fluoroscopy. The needle was removed, and a micropuncture sheath was placed over the wire. The inner dilator and wire were removed, and an 035 Bentson wire was advanced under fluoroscopy into the abdominal aorta. The sheath was removed and a standard 5 [place] vascular  sheath was placed. The dilator was removed and the sheath was flushed. C2 Cobra catheter advanced on the Bentson wire. Cobra catheter was used to select the SMA. Angiogram was performed. We attempted to advance the C2 catheter on a standard Glidewire, which was unable given the tortuous SMA takeoff. A coaxial high-flow Renegade 135 cm microcatheter was then advanced on 014 fathom wire into the ileocolic  artery. Angiogram was performed. The right colic artery was selected. Angiogram was performed. The microcatheter was then used to select terminal branches of the right colic artery independently, with angiogram performed at each artery in multiple obliquities using both breath hold and free breathing technique. Ultimately, we could not isolate the site of hemorrhage correlating to the findings on the CT angiogram as there was no extravasation of count tract, shunting, or angio dysplasia that was target oval for empiric embolization. All catheters wires were removed and Exoseal was deployed for hemostasis. Patient tolerated the procedure well and remained hemodynamically stable throughout. No complications were encountered and no significant blood loss. IMPRESSION: Status post ultrasound guided access right common femoral artery for SMA angiogram, super selective angiogram the right colic arteries of interest in attempt to isolate the source of recent lower GI hemorrhage. No embolization was performed. ExoSeal for hemostasis. Signed, Dulcy Fanny. Dellia Nims, RPVI [facility] Radiology Electronically Signed   By: Corrie Mckusick D.O.   On: 01/04/2021 13:01   CT Angio Abd/Pel w/ and/or w/o  Result Date: 01/04/2021 CLINICAL DATA:  70 year old male with melena and maroon colored stools. Evaluate for active GI bleed. EXAM: CTA ABDOMEN AND PELVIS WITHOUT AND WITH CONTRAST TECHNIQUE: Multidetector CT imaging of the abdomen and pelvis was performed using the standard protocol during bolus administration of intravenous contrast. Multiplanar reconstructed images and MIPs were obtained and reviewed to evaluate the vascular anatomy. CONTRAST:  175mL OMNIPAQUE IOHEXOL 350 MG/ML SOLN COMPARISON:  Prior CT abdomen/pelvis 04/18/2018; CT chest 10/08/2016 FINDINGS: VASCULAR Aorta: Scattered atherosclerotic vascular calcifications throughout the abdominal aorta. No evidence of aneurysm or  dissection. Celiac: Patent without evidence of aneurysm, dissection, vasculitis or significant stenosis. SMA: Patent without evidence of aneurysm, dissection, vasculitis or significant stenosis. Renals: Mild stenosis at the origin of the right renal artery secondary to fibrofatty atherosclerotic plaque. Left renal artery is widely patent. No accessory renal arteries. IMA: Focal moderate to high-grade stenosis at the origin. The remainder of the artery is patent. Inflow: The iliac arteries are highly tortuous. Scattered atherosclerotic calcifications without significant stenosis, occlusion, dissection or aneurysm. Proximal Outflow: Bilateral common femoral and visualized portions of the superficial and profunda femoral arteries are patent without evidence of aneurysm, dissection, vasculitis or significant stenosis. Veins: No focal venous abnormality. Review of the MIP images confirms the above findings. NON-VASCULAR Lower chest: No acute abnormality. Greater than 2 year stability of small pulmonary nodules (images 17 and 28 of series 10) consistent with benign granuloma. Trace dependent atelectasis. The visualized heart is normal in size. No pericardial effusion. No significant abnormality of the visualized distal thoracic esophagus. Hepatobiliary: No focal liver abnormality is seen. No gallstones, gallbladder wall thickening, or biliary dilatation. Pancreas: Unremarkable. No pancreatic ductal dilatation or surrounding inflammatory changes. Spleen: Normal in size without focal abnormality. Adrenals/Urinary Tract: The adrenal glands are normal. No hydronephrosis, nephrolithiasis or enhancing renal mass. Large simple cysts exophytic from the upper pole of the right kidney in the anterior interpolar left kidney. The ureters and bladder are unremarkable. Stomach/Bowel: Active extravasation of arterial phase contrast into  a diverticulum of the ascending colon just proximal to the hepatic flexure (images 80-94) with  evidence of blooming and expansion of the contrast blush on the delayed phase images. Findings are consistent with acute diverticular hemorrhage. Stomach and proximal small bowel are within normal limits. No focal bowel wall thickening or evidence of bowel obstruction. Lymphatic: No suspicious lymphadenopathy. Reproductive: Prostatomegaly. Other: Fat containing umbilical hernia.  No evidence of ascites. Musculoskeletal: No acute fracture or aggressive appearing lytic or blastic osseous lesion. Mild grade 1 anterolisthesis of L4 on L5 without spondylo lysis. Associated lower lumbar degenerative disc disease and facet arthropathy. IMPRESSION: 1. Positive for active diverticular bleed in the ascending colon just proximal to the hepatic flexure. 2. Scattered atherosclerotic vascular calcifications without aneurysm, dissection or occlusion. Aortic Atherosclerosis (ICD10-I70.0). 3. Progressive lower lumbar degenerative disc disease and facet arthropathy now with mild grade 1 anterolisthesis of L4 on L5. 4. Additional ancillary findings as above without significant interval change compared to prior imaging. Electronically Signed   By: Jacqulynn Cadet M.D.   On: 01/04/2021 09:38        Scheduled Meds:  sodium chloride   Intravenous Once   alfuzosin  10 mg Oral Daily   carbidopa-levodopa  1.5 tablet Oral TID   Chlorhexidine Gluconate Cloth  6 each Topical Daily   citalopram  20 mg Oral Daily   docusate sodium  200 mg Oral Daily   hydrALAZINE  10 mg Intravenous Once   isosorbide mononitrate  30 mg Oral Daily   mouth rinse  15 mL Mouth Rinse BID   metoprolol tartrate  50 mg Oral BID    morphine injection  2 mg Intravenous Once   multivitamin with minerals  1 tablet Oral Daily   nitroGLYCERIN  0.1 mg Transdermal Daily   pantoprazole (PROTONIX) IV  40 mg Intravenous Q12H   polyethylene glycol  17 g Oral Daily   simvastatin  40 mg Oral QHS   timolol  1 drop Both Eyes Daily   trihexyphenidyl  1 mg Oral  BID WC   Continuous Infusions:    LOS: 2 days    Time spent: 35 minutes    Dana Allan, MD  [facility] Pager #: [phone] 7PM-7AM contact night coverage as above

## 2021-01-05 NOTE — Progress Notes (Signed)
BP 76/40. MD notified-orders received for 500 cc bolus normal saline. Patient awake and oriented at this time with no complaints

## 2021-01-05 NOTE — Progress Notes (Signed)
RN was called 2/2 chest pain. EKG was done and showed ST elevation on V4 and V5. Second EKG showed the same. Nitro was given, and third EKG was done after resolution of pain, and V4 and V5 had T wave inversion without elevation. Trop was ordered. I called and spoke with cariology fellow on call. He advises that this is transient ST elevation, and there is nothing to do now. He rec's stress test when patient is pain free and also when patient hemoglobin is improved. 1 unit already ordered of PRBCs for hgb 6.5. Vital signs are stable. Continue to monitor.

## 2021-01-05 NOTE — Progress Notes (Signed)
Referring Physician(s): Dr. Annette Stable GI  Supervising Physician: Corrie Mckusick  Patient Status:  [facility] - In-pt  Chief Complaint:  GI Bleed  Brief History:  [name] is a 70 y.o. male presenting to [facility] from his GI office on Thursday June 9, with new hematochezia and syncope.     On Thursday, he reported new bloody BM's as and while in the office nearly fainted.  He was transported to the [facility] for further workup.   He has a history, about 10 years ago his wife says, of previous upper GI bleeding, possibly from ulcer disease.      Yesterday morning he had an episode of syncope in his room where a code blue was initiated.  Vital signs at that time = BP 88/60, HR <100, and 97% O2.     CTA was positive for extravasation of contrast at the proximal right colon.     CTA was reviewed and he was evaluated by Dr. Earleen Newport yesterday.  He underwent mesenteric angiography, but embolization was not done due to No overt extravasation of contrast that localized to the site ID'd on the CTA.   Subjective:  Sitting up in bed eating lunch. Family at bedside. Patient reports he has not had a BM yet this morning.  Allergies: Patient has no known allergies.  Medications: Prior to Admission medications   Medication Sig Start Date End Date Taking? Authorizing Provider  acetaminophen (TYLENOL) 500 MG tablet Take 500 mg by mouth every 6 (six) hours as needed for mild pain, fever or headache.   Yes [provider]  alfuzosin (UROXATRAL) 10 MG 24 hr tablet TAKE 1 TABLET BY MOUTH ONCE A DAY Patient taking differently: Take 10 mg by mouth daily. 11/28/20 11/28/21 Yes MacDiarmid, Nicki Reaper, MD  bismuth subsalicylate (PEPTO BISMOL) 262 MG chewable tablet Chew 524 mg by mouth as needed for diarrhea or loose stools.   Yes [provider]  carbidopa-levodopa (SINEMET IR) 25-100 MG tablet TAKE 1 AND 1/2 TABLETS BY MOUTH THREE TIMES DAILY Patient taking differently:  Take 1.5 tablets by mouth 3 (three) times daily. 08/21/20 08/21/21 Yes Garvin Fila, MD  citalopram (CELEXA) 40 MG tablet TAKE 1 TABLET BY MOUTH ONCE A DAY Patient taking differently: Take 20 mg by mouth daily. 09/10/20 09/10/21 Yes Denita Lung, MD  docusate sodium (COLACE) 100 MG capsule Take 200 mg by mouth daily.   Yes [provider]  irbesartan (AVAPRO) 300 MG tablet TAKE 1 TABLET BY MOUTH EVERY MORNING Patient taking differently: Take 300 mg by mouth every morning. 03/14/20 03/14/21 Yes [name], MD  isosorbide mononitrate (IMDUR) 30 MG 24 hr tablet Take 1 tablet (30 mg total) by mouth daily.  (Take 1/2 hour after taking tylenol) 12/24/20 03/24/21 Yes [name], MD  L-Methylfolate-B12-B6-B2 (CEREFOLIN) 12-25-48-5 MG TABS Take 1 tablet by mouth daily. 10/01/20  Yes Garvin Fila, MD  Multiple Vitamin (MULTIVITAMIN WITH MINERALS) TABS tablet Take 1 tablet by mouth daily.   Yes [provider]  nitroGLYCERIN (NITROSTAT) 0.4 MG SL tablet Place 1 tablet (0.4 mg total) under the tongue every 5 (five) minutes as needed for chest pain. 11/28/20  Yes [name], MD  pantoprazole (PROTONIX) 40 MG tablet Take 40 mg by mouth 2 (two) times daily. 06/17/15  Yes [provider]  simvastatin (ZOCOR) 40 MG tablet TAKE 1 TABLET BY MOUTH AT BEDTIME Patient taking differently: Take 40 mg by mouth at bedtime. 08/22/20 08/22/21  Yes Denita Lung, MD  timolol (TIMOPTIC) 0.5 % ophthalmic solution Place 1 drop into both eyes daily.   Yes [provider]  trihexyphenidyl (ARTANE) 2 MG tablet TAKE 1/2 TABLET BY MOUTH 2 TIMES DAILY WITH A MEAL Patient taking differently: Take 1 mg by mouth 2 (two) times daily with a meal. 10/01/20 10/01/21 Yes Sethi, Lucy Antigua, MD  verapamil (CALAN-SR) 240 MG CR tablet TAKE 1 TABLET BY MOUTH AT BEDTIME. Patient taking differently: Take 240 mg by mouth at bedtime. 02/08/20 02/19/21 Yes [name], MD  VITAMIN D PO Take 1 capsule by mouth  daily.   Yes [provider]  meloxicam (MOBIC) 15 MG tablet Take 1 tablet by mouth with a meal once daily for 14 days. Patient not taking: Reported on 01/02/2021 12/19/20     metoprolol tartrate (LOPRESSOR) 50 MG tablet Take 2 tablets (100 mg total) by mouth once for 1 dose. TAKE TWO HOURS PRIOR TO  SCHEDULE CARDIAC TEST 12/24/20 12/25/20  [name], MD  predniSONE (DELTASONE) 10 MG tablet Take 3 tablets by mouth for 3 days, take 2 tablets for 2 days then take 1 tablet for 1 day 12/26/20        Vital Signs: BP 123/69 (BP Location: Right Arm)   Pulse (!) 101   Temp 97.8 F (36.6 C) (Oral)   Resp 15   Ht 5\' 10"  (1.778 m)   Wt 69.6 kg   SpO2 100%   BMI 22.02 kg/m   Physical Exam Constitutional:      Appearance: Normal appearance.  HENT:     Head: Normocephalic and atraumatic.  Cardiovascular:     Rate and Rhythm: Normal rate.  Pulmonary:     Effort: Pulmonary effort is normal. No respiratory distress.  Abdominal:     Palpations: Abdomen is soft.     Tenderness: There is no abdominal tenderness.  Musculoskeletal:     Comments: CFA access site without hematoma/pseudoaneurysm  Skin:    General: Skin is warm and dry.  Neurological:     General: No focal deficit present.     Mental Status: He is alert and oriented to person, place, and time.  Psychiatric:        Mood and Affect: Mood normal.        Behavior: Behavior normal.        Thought Content: Thought content normal.        Judgment: Judgment normal.    Imaging: IR Angiogram Visceral Selective  Result Date: 01/04/2021 INDICATION: 70 year old male with a history of lower GI hemorrhage, isolated to the ascending colon on recent CT angiogram, presents for mesenteric angiogram and possible embolization EXAM: ULTRASOUND-GUIDED ACCESS RIGHT COMMON FEMORAL ARTERY MESENTERIC ANGIOGRAM INVOLVING SMA, SUB SELECTION RIGHT COLIC ARTERY, AND SUPER SELECTIVE ANGIOGRAM OF TERMINAL RIGHT COLIC ARTERY BRANCHES EXOSEAL FOR  HEMOSTASIS MEDICATIONS: None ANESTHESIA/SEDATION: Moderate (conscious) sedation was employed during this procedure. A total of Versed 1.0 mg and Fentanyl 25 mcg was administered intravenously. Moderate Sedation Time: 39 minutes. The patient's level of consciousness and vital signs were monitored continuously by radiology nursing throughout the procedure under my direct supervision. CONTRAST:  80 cc FLUOROSCOPY TIME:  Fluoroscopy Time: 5 minutes 36 seconds (2,325 mGy). COMPLICATIONS: None PROCEDURE: Informed consent was obtained from the patient following explanation of the procedure, risks, benefits and alternatives. The patient understands, agrees and consents for the procedure. All questions were addressed. A time out was performed prior to the initiation of the procedure. Maximal barrier  sterile technique utilized including caps, mask, sterile gowns, sterile gloves, large sterile drape, hand hygiene, and Betadine prep. Ultrasound survey of the right inguinal region was performed with images stored and sent to PACs, confirming patency of the vessel. A micropuncture needle was used access the right common femoral artery under ultrasound. With excellent arterial blood flow returned, and an .018 micro wire was passed through the needle, observed enter the abdominal aorta under fluoroscopy. The needle was removed, and a micropuncture sheath was placed over the wire. The inner dilator and wire were removed, and an 035 Bentson wire was advanced under fluoroscopy into the abdominal aorta. The sheath was removed and a standard 5 [place] vascular sheath was placed. The dilator was removed and the sheath was flushed. C2 Cobra catheter advanced on the Bentson wire. Cobra catheter was used to select the SMA. Angiogram was performed. We attempted to advance the C2 catheter on a standard Glidewire, which was unable given the tortuous SMA takeoff. A coaxial high-flow Renegade 135 cm microcatheter was then advanced on 014 fathom  wire into the ileocolic artery. Angiogram was performed. The right colic artery was selected. Angiogram was performed. The microcatheter was then used to select terminal branches of the right colic artery independently, with angiogram performed at each artery in multiple obliquities using both breath hold and free breathing technique. Ultimately, we could not isolate the site of hemorrhage correlating to the findings on the CT angiogram as there was no extravasation of count tract, shunting, or angio dysplasia that was target oval for empiric embolization. All catheters wires were removed and Exoseal was deployed for hemostasis. Patient tolerated the procedure well and remained hemodynamically stable throughout. No complications were encountered and no significant blood loss. IMPRESSION: Status post ultrasound guided access right common femoral artery for SMA angiogram, super selective angiogram the right colic arteries of interest in attempt to isolate the source of recent lower GI hemorrhage. No embolization was performed. ExoSeal for hemostasis. Signed, Dulcy Fanny. Dellia Nims, RPVI [facility] Radiology Electronically Signed   By: Corrie Mckusick D.O.   On: 01/04/2021 13:01   IR Angiogram Selective Each Additional Vessel  Result Date: 01/04/2021 INDICATION: 70 year old male with a history of lower GI hemorrhage, isolated to the ascending colon on recent CT angiogram, presents for mesenteric angiogram and possible embolization EXAM: ULTRASOUND-GUIDED ACCESS RIGHT COMMON FEMORAL ARTERY MESENTERIC ANGIOGRAM INVOLVING SMA, SUB SELECTION RIGHT COLIC ARTERY, AND SUPER SELECTIVE ANGIOGRAM OF TERMINAL RIGHT COLIC ARTERY BRANCHES EXOSEAL FOR HEMOSTASIS MEDICATIONS: None ANESTHESIA/SEDATION: Moderate (conscious) sedation was employed during this procedure. A total of Versed 1.0 mg and Fentanyl 25 mcg was administered intravenously. Moderate Sedation Time: 39 minutes. The  patient's level of consciousness and vital signs were monitored continuously by radiology nursing throughout the procedure under my direct supervision. CONTRAST:  80 cc FLUOROSCOPY TIME:  Fluoroscopy Time: 5 minutes 36 seconds (2,325 mGy). COMPLICATIONS: None PROCEDURE: Informed consent was obtained from the patient following explanation of the procedure, risks, benefits and alternatives. The patient understands, agrees and consents for the procedure. All questions were addressed. A time out was performed prior to the initiation of the procedure. Maximal barrier sterile technique utilized including caps, mask, sterile gowns, sterile gloves, large sterile drape, hand hygiene, and Betadine prep. Ultrasound survey of the right inguinal region was performed with images stored and sent to PACs, confirming patency of the vessel. A micropuncture needle was used access the right common femoral artery under ultrasound. With excellent arterial blood flow  returned, and an .018 micro wire was passed through the needle, observed enter the abdominal aorta under fluoroscopy. The needle was removed, and a micropuncture sheath was placed over the wire. The inner dilator and wire were removed, and an 035 Bentson wire was advanced under fluoroscopy into the abdominal aorta. The sheath was removed and a standard 5 [place] vascular sheath was placed. The dilator was removed and the sheath was flushed. C2 Cobra catheter advanced on the Bentson wire. Cobra catheter was used to select the SMA. Angiogram was performed. We attempted to advance the C2 catheter on a standard Glidewire, which was unable given the tortuous SMA takeoff. A coaxial high-flow Renegade 135 cm microcatheter was then advanced on 014 fathom wire into the ileocolic artery. Angiogram was performed. The right colic artery was selected. Angiogram was performed. The microcatheter was then used to select terminal branches of the right colic artery independently, with angiogram  performed at each artery in multiple obliquities using both breath hold and free breathing technique. Ultimately, we could not isolate the site of hemorrhage correlating to the findings on the CT angiogram as there was no extravasation of count tract, shunting, or angio dysplasia that was target oval for empiric embolization. All catheters wires were removed and Exoseal was deployed for hemostasis. Patient tolerated the procedure well and remained hemodynamically stable throughout. No complications were encountered and no significant blood loss. IMPRESSION: Status post ultrasound guided access right common femoral artery for SMA angiogram, super selective angiogram the right colic arteries of interest in attempt to isolate the source of recent lower GI hemorrhage. No embolization was performed. ExoSeal for hemostasis. Signed, Dulcy Fanny. Dellia Nims, RPVI [facility] Inter[facility] Radiology Specialists [facility] Radiology Electronically Signed   By: Corrie Mckusick D.O.   On: 01/04/2021 13:01   IR Angiogram Selective Each Additional Vessel  Result Date: 01/04/2021 INDICATION: 70 year old male with a history of lower GI hemorrhage, isolated to the ascending colon on recent CT angiogram, presents for mesenteric angiogram and possible embolization EXAM: ULTRASOUND-GUIDED ACCESS RIGHT COMMON FEMORAL ARTERY MESENTERIC ANGIOGRAM INVOLVING SMA, SUB SELECTION RIGHT COLIC ARTERY, AND SUPER SELECTIVE ANGIOGRAM OF TERMINAL RIGHT COLIC ARTERY BRANCHES EXOSEAL FOR HEMOSTASIS MEDICATIONS: None ANESTHESIA/SEDATION: Moderate (conscious) sedation was employed during this procedure. A total of Versed 1.0 mg and Fentanyl 25 mcg was administered intravenously. Moderate Sedation Time: 39 minutes. The patient's level of consciousness and vital signs were monitored continuously by radiology nursing throughout the procedure under my direct supervision. CONTRAST:  80 cc FLUOROSCOPY TIME:  Fluoroscopy Time: 5 minutes 36 seconds (2,325 mGy).  COMPLICATIONS: None PROCEDURE: Informed consent was obtained from the patient following explanation of the procedure, risks, benefits and alternatives. The patient understands, agrees and consents for the procedure. All questions were addressed. A time out was performed prior to the initiation of the procedure. Maximal barrier sterile technique utilized including caps, mask, sterile gowns, sterile gloves, large sterile drape, hand hygiene, and Betadine prep. Ultrasound survey of the right inguinal region was performed with images stored and sent to PACs, confirming patency of the vessel. A micropuncture needle was used access the right common femoral artery under ultrasound. With excellent arterial blood flow returned, and an .018 micro wire was passed through the needle, observed enter the abdominal aorta under fluoroscopy. The needle was removed, and a micropuncture sheath was placed over the wire. The inner dilator and wire were removed, and an 035 Bentson wire was advanced under fluoroscopy into the abdominal aorta. The sheath was removed and a  standard 5 [place] vascular sheath was placed. The dilator was removed and the sheath was flushed. C2 Cobra catheter advanced on the Bentson wire. Cobra catheter was used to select the SMA. Angiogram was performed. We attempted to advance the C2 catheter on a standard Glidewire, which was unable given the tortuous SMA takeoff. A coaxial high-flow Renegade 135 cm microcatheter was then advanced on 014 fathom wire into the ileocolic artery. Angiogram was performed. The right colic artery was selected. Angiogram was performed. The microcatheter was then used to select terminal branches of the right colic artery independently, with angiogram performed at each artery in multiple obliquities using both breath hold and free breathing technique. Ultimately, we could not isolate the site of hemorrhage correlating to the findings on the CT angiogram as there was no extravasation of  count tract, shunting, or angio dysplasia that was target oval for empiric embolization. All catheters wires were removed and Exoseal was deployed for hemostasis. Patient tolerated the procedure well and remained hemodynamically stable throughout. No complications were encountered and no significant blood loss. IMPRESSION: Status post ultrasound guided access right common femoral artery for SMA angiogram, super selective angiogram the right colic arteries of interest in attempt to isolate the source of recent lower GI hemorrhage. No embolization was performed. ExoSeal for hemostasis. Signed, Dulcy Fanny. Dellia Nims, RPVI [facility] Interventional Radiology Specialists [facility] Radiology Electronically Signed   By: Corrie Mckusick D.O.   On: 01/04/2021 13:01   IR US Guide Vasc Access Right  Result Date: 01/04/2021 INDICATION: 70 year old male with a history of lower GI hemorrhage, isolated to the ascending colon on recent CT angiogram, presents for mesenteric angiogram and possible embolization EXAM: ULTRASOUND-GUIDED ACCESS RIGHT COMMON FEMORAL ARTERY MESENTERIC ANGIOGRAM INVOLVING SMA, SUB SELECTION RIGHT COLIC ARTERY, AND SUPER SELECTIVE ANGIOGRAM OF TERMINAL RIGHT COLIC ARTERY BRANCHES EXOSEAL FOR HEMOSTASIS MEDICATIONS: None ANESTHESIA/SEDATION: Moderate (conscious) sedation was employed during this procedure. A total of Versed 1.0 mg and Fentanyl 25 mcg was administered intravenously. Moderate Sedation Time: 39 minutes. The patient's level of consciousness and vital signs were monitored continuously by radiology nursing throughout the procedure under my direct supervision. CONTRAST:  80 cc FLUOROSCOPY TIME:  Fluoroscopy Time: 5 minutes 36 seconds (2,325 mGy). COMPLICATIONS: None PROCEDURE: Informed consent was obtained from the patient following explanation of the procedure, risks, benefits and alternatives. The patient understands, agrees and consents for the procedure. All questions were addressed. A time  out was performed prior to the initiation of the procedure. Maximal barrier sterile technique utilized including caps, mask, sterile gowns, sterile gloves, large sterile drape, hand hygiene, and Betadine prep. Ultrasound survey of the right inguinal region was performed with images stored and sent to PACs, confirming patency of the vessel. A micropuncture needle was used access the right common femoral artery under ultrasound. With excellent arterial blood flow returned, and an .018 micro wire was passed through the needle, observed enter the abdominal aorta under fluoroscopy. The needle was removed, and a micropuncture sheath was placed over the wire. The inner dilator and wire were removed, and an 035 Bentson wire was advanced under fluoroscopy into the abdominal aorta. The sheath was removed and a standard 5 [place] vascular sheath was placed. The dilator was removed and the sheath was flushed. C2 Cobra catheter advanced on the Bentson wire. Cobra catheter was used to select the SMA. Angiogram was performed. We attempted to advance the C2 catheter on a standard Glidewire, which was unable given the tortuous SMA takeoff. A coaxial high-flow  Renegade 135 cm microcatheter was then advanced on 014 fathom wire into the ileocolic artery. Angiogram was performed. The right colic artery was selected. Angiogram was performed. The microcatheter was then used to select terminal branches of the right colic artery independently, with angiogram performed at each artery in multiple obliquities using both breath hold and free breathing technique. Ultimately, we could not isolate the site of hemorrhage correlating to the findings on the CT angiogram as there was no extravasation of count tract, shunting, or angio dysplasia that was target oval for empiric embolization. All catheters wires were removed and Exoseal was deployed for hemostasis. Patient tolerated the procedure well and remained hemodynamically stable throughout. No  complications were encountered and no significant blood loss. IMPRESSION: Status post ultrasound guided access right common femoral artery for SMA angiogram, super selective angiogram the right colic arteries of interest in attempt to isolate the source of recent lower GI hemorrhage. No embolization was performed. ExoSeal for hemostasis. Signed, Dulcy Fanny. Dellia Nims, RPVI [facility] Interventional Radiology Specialists [facility] Radiology Electronically Signed   By: Corrie Mckusick D.O.   On: 01/04/2021 13:01   OCT, Retina - OU - Both Eyes  Result Date: 01/02/2021 Right Eye Quality was good. Scan locations included subfoveal. Central Foveal Thickness: 205. Findings include outer retinal atrophy, central retinal atrophy. Left Eye Quality was good. Scan locations included subfoveal. Central Foveal Thickness: 381. Progression has been stable. Notes Diffuse macular atrophy right eye no interval change Mild diffuse macular thickening left eye with no active CME.  No change in foveal elevation over the last 2 years. Minor CME superonasal, but this is old and involutional and unchanged for years with no encroachment to center of the fovea  Color Fundus Photography Optos - OU - Both Eyes  Result Date: 01/02/2021 Right Eye Progression has been stable. Notes OD with cloudy media secondary to corneal opacity, striae from corneal edema Retina attached, optic nerve atrophy good PRP peripheral OS, mild temporal optic atrophy. 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, no active macular edema  VAS US CAROTID  Result Date: 01/02/2021 Carotid Arterial Duplex Study Patient Name:  TORRES HARDENBROOK  Date of Exam:   01/01/2021 Medical Rec #: [id number]        Accession #:    [id number] Date of Birth: 1951/01/24        Patient Gender: M Patient Age:   51Y Exam Location:  [facility] Procedure:      VAS US CAROTID Referring Phys: [id number] DAVID W HARDING --------------------------------------------------------------------------------  Indications:        Patient reports intermittent tingling sensations in the left                    jaw area, across the left chest area at surround the front                    area of the left knee. Symptoms do not always come in all                    areas at the same time. These symptoms have been going on for                    one to two months. He also states he has been having                    headaches for the past week but relates it to a  new                    medication he is taking (could not recall the name). 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 Lightheadedness when he has to look                    over the right shoulder while driving. He denies any other                    cerebrovascular symptoms. Risk Factors:      Hyperlipidemia, past history of smoking. Comparison Study:  NA Performing Technologist: Sharlett Iles RVT  Examination Guidelines: A complete evaluation includes B-mode imaging, spectral Doppler, color Doppler, and power Doppler as needed of all accessible portions of each vessel. Bilateral testing is considered an integral part of a complete examination. Limited examinations for reoccurring indications may be performed as noted.  Right Carotid Findings: +----------+--------+--------+--------+------------------+--------+           PSV cm/sEDV cm/sStenosisPlaque DescriptionComments +----------+--------+--------+--------+------------------+--------+ CCA Prox  53      6                                 tortuous +----------+--------+--------+--------+------------------+--------+ CCA Distal57      13                                         +----------+--------+--------+--------+------------------+--------+ ICA Prox  43      10      1-39%   heterogenous               +----------+--------+--------+--------+------------------+--------+ ICA Mid   41      17              heterogenous                +----------+--------+--------+--------+------------------+--------+ ICA Distal35      17                                         +----------+--------+--------+--------+------------------+--------+ ECA       77      10                                         +----------+--------+--------+--------+------------------+--------+ +----------+--------+-------+---------+-------------------+           PSV cm/sEDV cmsDescribe Arm Pressure (mmHG) +----------+--------+-------+---------+-------------------+ [id number]            [id number]                 +----------+--------+-------+---------+-------------------+ +---------+--------+--+--------+-+---------------------------+ VertebralPSV cm/s26EDV cm/s6Antegrade and Small caliber +---------+--------+--+--------+-+---------------------------+  Left Carotid Findings: +----------+--------+--------+--------+------------------+--------+           PSV cm/sEDV cm/sStenosisPlaque DescriptionComments +----------+--------+--------+--------+------------------+--------+ CCA Prox  81      14                                         +----------+--------+--------+--------+------------------+--------+  CCA Distal78      15                                         +----------+--------+--------+--------+------------------+--------+ ICA Prox  101     33      1-39%   heterogenous               +----------+--------+--------+--------+------------------+--------+ ICA Mid   49      17                                         +----------+--------+--------+--------+------------------+--------+ ICA Distal52      24                                         +----------+--------+--------+--------+------------------+--------+ ECA       81      10                                         +----------+--------+--------+--------+------------------+--------+  +----------+--------+--------+----------------+-------------------+           PSV cm/sEDV cm/sDescribe        Arm Pressure (mmHG) +----------+--------+--------+----------------+-------------------+ Subclavian160             Multiphasic, FAO130                 +----------+--------+--------+----------------+-------------------+ +---------+--------+--+--------+--+---------+ VertebralPSV cm/s37EDV cm/s13Antegrade +---------+--------+--+--------+--+---------+   Summary: Right Carotid: Velocities in the right ICA are consistent with a 1-39% stenosis. Left Carotid: Velocities in the left ICA are consistent with a 1-39% stenosis. Vertebrals:  Bilateral vertebral arteries demonstrate antegrade flow. Small              caliber right vertebral artery. Subclavians: Right subclavian artery flow was disturbed. Normal flow              hemodynamics were seen in the left subclavian artery. *See table(s) above for measurements and observations.  Electronically signed by Larae Grooms MD on 01/02/2021 at 2:23:23 PM.    Final    CT Angio Abd/Pel w/ and/or w/o  Result Date: 01/04/2021 CLINICAL DATA:  70 year old male with melena and maroon colored stools. Evaluate for active GI bleed. EXAM: CTA ABDOMEN AND PELVIS WITHOUT AND WITH CONTRAST TECHNIQUE: Multidetector CT imaging of the abdomen and pelvis was performed using the standard protocol during bolus administration of intravenous contrast. Multiplanar reconstructed images and MIPs were obtained and reviewed to evaluate the vascular anatomy. CONTRAST:  161mL OMNIPAQUE IOHEXOL 350 MG/ML SOLN COMPARISON:  Prior CT abdomen/pelvis 04/18/2018; CT chest 10/08/2016 FINDINGS: VASCULAR Aorta: Scattered atherosclerotic vascular calcifications throughout the abdominal aorta. No evidence of aneurysm or dissection. Celiac: Patent without evidence of aneurysm, dissection, vasculitis or significant stenosis. SMA: Patent without evidence of aneurysm, dissection, vasculitis  or significant stenosis. Renals: Mild stenosis at the origin of the right renal artery secondary to fibrofatty atherosclerotic plaque. Left renal artery is widely patent. No accessory renal arteries. IMA: Focal moderate to high-grade stenosis at the origin. The remainder of the artery is patent. Inflow: The iliac arteries are highly tortuous. Scattered atherosclerotic calcifications without significant stenosis, occlusion, dissection or aneurysm. Proximal Outflow:  Bilateral common femoral and visualized portions of the superficial and profunda femoral arteries are patent without evidence of aneurysm, dissection, vasculitis or significant stenosis. Veins: No focal venous abnormality. Review of the MIP images confirms the above findings. NON-VASCULAR Lower chest: No acute abnormality. Greater than 2 year stability of small pulmonary nodules (images 17 and 28 of series 10) consistent with benign granuloma. Trace dependent atelectasis. The visualized heart is normal in size. No pericardial effusion. No significant abnormality of the visualized distal thoracic esophagus. Hepatobiliary: No focal liver abnormality is seen. No gallstones, gallbladder wall thickening, or biliary dilatation. Pancreas: Unremarkable. No pancreatic ductal dilatation or surrounding inflammatory changes. Spleen: Normal in size without focal abnormality. Adrenals/Urinary Tract: The adrenal glands are normal. No hydronephrosis, nephrolithiasis or enhancing renal mass. Large simple cysts exophytic from the upper pole of the right kidney in the anterior interpolar left kidney. The ureters and bladder are unremarkable. Stomach/Bowel: Active extravasation of arterial phase contrast into a diverticulum of the ascending colon just proximal to the hepatic flexure (images 80-94) with evidence of blooming and expansion of the contrast blush on the delayed phase images. Findings are consistent with acute diverticular hemorrhage. Stomach and proximal small  bowel are within normal limits. No focal bowel wall thickening or evidence of bowel obstruction. Lymphatic: No suspicious lymphadenopathy. Reproductive: Prostatomegaly. Other: Fat containing umbilical hernia.  No evidence of ascites. Musculoskeletal: No acute fracture or aggressive appearing lytic or blastic osseous lesion. Mild grade 1 anterolisthesis of L4 on L5 without spondylo lysis. Associated lower lumbar degenerative disc disease and facet arthropathy. IMPRESSION: 1. Positive for active diverticular bleed in the ascending colon just proximal to the hepatic flexure. 2. Scattered atherosclerotic vascular calcifications without aneurysm, dissection or occlusion. Aortic Atherosclerosis (ICD10-I70.0). 3. Progressive lower lumbar degenerative disc disease and facet arthropathy now with mild grade 1 anterolisthesis of L4 on L5. 4. Additional ancillary findings as above without significant interval change compared to prior imaging. Electronically Signed   By: Jacqulynn Cadet M.D.   On: 01/04/2021 09:38    Labs:  CBC: Recent Labs    01/04/21 0011 01/04/21 0831 01/04/21 1523 01/05/21 0323  WBC 6.1 9.2 9.8 9.1  HGB 9.5* 7.9* 8.1* 6.5*  HCT 28.0* 24.0* 26.8* 20.3*  PLT 202 221 236 177    COAGS: No results for input(s): INR, APTT in the last 8760 hours.  BMP: Recent Labs    09/10/20 1058 01/02/21 1238 01/03/21 0412 01/04/21 0011 01/04/21 0830  NA 137 133* 135 133* 135  K 4.3 4.0 4.1 3.9 4.0  CL 101 104 104 103 105  CO2 22 24 26 24 23   GLUCOSE 96 95 77 119* 136*  BUN 22 47* 32* 33* 35*  CALCIUM 9.4 8.0* 8.5* 8.2* 8.1*  CREATININE 1.24 1.18 0.94 1.06 1.17  GFRNONAA 59* >60 >60 >60 >60  GFRAA 68  --   --   --   --     LIVER FUNCTION TESTS: Recent Labs    09/10/20 1058 01/02/21 1238  BILITOT 0.5 0.5  AST 16 13*  ALT 10 8  ALKPHOS 74 37*  PROT 7.4 5.3*  ALBUMIN 4.8 3.1*    Assessment and Plan:  GI bleed  Mesenteric angiography done yesterday by Dr. Earleen Newport showed no  overt extravasation of contrast that localized to the site ID'd on the CTA.   Embolization NOT done.  Hgb drop to 6.5 but no overt evidence of bleeding at this time. Another unit of PRBCs has been ordered.  Can remove  CFA access dressing tomorrow, OK to shower. Do not submerge x 1 week.  Will sign off.  Electronically Signed: Murrell Redden, PA-C 01/05/2021, 11:46 AM    I spent a total of 15 Minutes at the the patient's bedside AND on the patient's hospital floor or unit, greater than 50% of which was counseling/coordinating care for f/u mesenteric angio for GI bleed.

## 2021-01-06 ENCOUNTER — Encounter (HOSPITAL_COMMUNITY): Payer: Self-pay | Admitting: Internal Medicine

## 2021-01-06 DIAGNOSIS — I248 Other forms of acute ischemic heart disease: Secondary | ICD-10-CM | POA: Diagnosis not present

## 2021-01-06 DIAGNOSIS — I10 Essential (primary) hypertension: Secondary | ICD-10-CM

## 2021-01-06 DIAGNOSIS — K922 Gastrointestinal hemorrhage, unspecified: Secondary | ICD-10-CM

## 2021-01-06 DIAGNOSIS — I201 Angina pectoris with documented spasm: Secondary | ICD-10-CM

## 2021-01-06 LAB — CBC
HCT: 23.2 % — ABNORMAL LOW (ref 39.0–52.0)
HCT: 24.2 % — ABNORMAL LOW (ref 39.0–52.0)
Hemoglobin: 7.7 g/dL — ABNORMAL LOW (ref 13.0–17.0)
Hemoglobin: 8 g/dL — ABNORMAL LOW (ref 13.0–17.0)
MCH: 30.1 pg (ref 26.0–34.0)
MCH: 30.4 pg (ref 26.0–34.0)
MCHC: 33.2 g/dL (ref 30.0–36.0)
MCHC: 33.1 g/dL (ref 30.0–36.0)
MCV: 90.6 fL (ref 80.0–100.0)
MCV: 92 fL (ref 80.0–100.0)
Platelets: 164 10*3/uL (ref 150–400)
Platelets: 170 10*3/uL (ref 150–400)
RBC: 2.56 MIL/uL — ABNORMAL LOW (ref 4.22–5.81)
RBC: 2.63 MIL/uL — ABNORMAL LOW (ref 4.22–5.81)
RDW: 15.5 % (ref 11.5–15.5)
RDW: 15.3 % (ref 11.5–15.5)
WBC: 9 10*3/uL (ref 4.0–10.5)
WBC: 9.1 10*3/uL (ref 4.0–10.5)
nRBC: 0 % (ref 0.0–0.2)
nRBC: 0 % (ref 0.0–0.2)

## 2021-01-06 LAB — TYPE AND SCREEN
ABO/RH(D): B POS
Antibody Screen: NEGATIVE
Unit division: 0
Unit division: 0

## 2021-01-06 LAB — BPAM RBC
Blood Product Expiration Date: 202207052359
Blood Product Expiration Date: 202207052359
ISSUE DATE / TIME: 202206120545
ISSUE DATE / TIME: 202206121318
Unit Type and Rh: 7300
Unit Type and Rh: 7300

## 2021-01-06 MED ORDER — SALINE SPRAY 0.65 % NA SOLN
1.0000 | NASAL | Status: DC | PRN
  Administered 2021-01-06: 1 via NASAL
  Filled 2021-01-06: qty 44

## 2021-01-06 MED ORDER — VERAPAMIL HCL ER 240 MG PO TBCR
240.0000 mg | EXTENDED_RELEASE_TABLET | Freq: Every day | ORAL | Status: DC
  Administered 2021-01-06 – 2021-01-09 (×3): 240 mg via ORAL
  Filled 2021-01-06 (×4): qty 1

## 2021-01-06 MED ORDER — ATORVASTATIN CALCIUM 40 MG PO TABS
40.0000 mg | ORAL_TABLET | Freq: Every evening | ORAL | Status: DC
  Administered 2021-01-06 – 2021-01-15 (×10): 40 mg via ORAL
  Filled 2021-01-06 (×10): qty 1

## 2021-01-06 NOTE — Plan of Care (Signed)
  Problem: Education: Goal: Knowledge of General Education information will improve Description: Including pain rating scale, medication(s)/side effects and non-pharmacologic comfort measures Outcome: Progressing   Problem: Nutrition: Goal: Adequate nutrition will be maintained Outcome: Progressing   Problem: Coping: Goal: Level of anxiety will decrease Outcome: Progressing   

## 2021-01-06 NOTE — TOC Progression Note (Signed)
Transition of Care [facility]) - Progression Note    Patient Details  Name: [name] MRN: [id number] Date of Birth: September 09, 1950  Transition of Care [facility]) CM/SW Contact  Leeroy Cha, RN Phone Number: 01/06/2021, 8:42 AM  Clinical Narrative:     70 y.o. male presenting to [facility] from his GI office on Thursday June 9, with new hematochezia and syncope.     I met Mr. Chichester and his wife in his room this am.   They tell me that he was at the office on Thursday with complaints of new bloody BM's as of Thursday morning, and while in the office nearly fainted.  He was transported to the hospital for further workup.   He has a history, about 10 years ago his wife says, of previous upper GI bleeding, possibly from ulcer disease.  This event is new.     He has not had any hematemesis on this occasion.  He denies any abdominal pain.     He did, this morning, have an episode of syncope in his room where a code blue was initiated, with his vital signs documenting a BP of 88/60, HR <100, and 97% O2.     This prompted a CTA, diagnostics which I had discussed with Dr. Cristina Gong as an appropriate test in this scenario.  This CTA was performed and was positive for extravasation of contrast at the proximal right colon.    PLAN: to return to home, hgb this am is 7.7 rec'ing one unit prbc Expected Discharge Plan: Home/Self Care Barriers to Discharge: No Barriers Identified  Expected Discharge Plan and Services Expected Discharge Plan: Home/Self Care                                               Social Determinants of Health (SDOH) Interventions    Readmission Risk Interventions No flowsheet data found.

## 2021-01-06 NOTE — Plan of Care (Signed)
  Problem: Skin Integrity: Goal: Risk for impaired skin integrity will decrease Outcome: Progressing   

## 2021-01-06 NOTE — Progress Notes (Signed)
PROGRESS NOTE  FERN ASMAR DXA:[id number] DOB: May 01, 1951 DOA: 01/02/2021 PCP: Denita Lung, MD   LOS: 2 days   Brief narrative: Patient is a 70 year old African-American male with past medical history significant for Parkinson disease, BPH, HTN, HLD and GI bleed presented to hospital with intermittent bleeding.  EGD was done which did not show any evidence of bleeding.  Patient underwent CTA of the abdomen pelvis that localized bleeding to the colon area.  IR was consulted but embolization was not successful.  Also had some chest discomfort on 01/05/2021.  Cardiology was consulted.  Patient received PRBC transfusion.  No chest pain today.  Assessment/Plan:  Active Problems:   Acute GI bleeding   GI bleed   GI bleed likely diverticular: CT was unremarkable.  Phillip Heal showed localized bleeding to the ascending colon but embolization was not successful as there was no significant bleeding during the procedure.  Continue CBC monitoring.  GI on board.  Patient had received a blood transfusion yesterday.  Hemodynamically stable at this time.  Continue PPI for now.  Acute blood loss anemia: Likely secondary to diverticular bleed.  Hemoglobin has remained stable at 7.7 at this time.  GI on board.  Will transfuse if necessary.  We will continue to monitor CBC.     Hypertension/hypotension: Needed to have volume resuscitation for hypotension.  Blood pressure better at this time.  We will continue to monitor.  Off Avapro.  Mild elevated troponins.  Cardiology on board.  Could be secondary to demand ischemia from anemia.  Continue to transfuse.  Unable to use antiplatelets at this time due to ongoing GI bleed.  Continue beta-blockers or nitrates.  Check 2D echocardiogram for wall motion abnormality.  Continue statins.  Parkinson disease Continue Sinemet.  We will get physical therapy evaluation.  BPH on alfuzosin Zosyn.  DVT prophylaxis: SCDs Start: 01/02/21 1643    Code Status: Full  code  Family Communication: None today  Status is: Inpatient  Remains inpatient appropriate because:Ongoing diagnostic testing needed not appropriate for outpatient work up, Unsafe d/c plan, IV treatments appropriate due to intensity of illness or inability to take PO, and Inpatient level of care appropriate due to severity of illness  Dispo: The patient is from: Home              Anticipated d/c is to: Home              Patient currently is not medically stable to d/c.   Difficult to place patient No    Consultants: Gastroenterology. Interventional radiology. Cardiology  Procedures: EGD Angiogram attempted  Anti-infectives:  None  Anti-infectives (From admission, onward)    None       Subjective: Today, patient was seen and examined at bedside.  Patient denies any nausea, vomiting, abdominal pain.  States that he did have some tarry black stools.  Denies any shortness of breath or dizziness.  Feels tired denies any chest pain.  Objective: Vitals:   01/06/21 0809 01/06/21 0823  BP: (!) 142/84 (!) 149/99  Pulse: 63 60  Resp: 14 14  Temp:    SpO2: 95% 100%    Intake/Output Summary (Last 24 hours) at 01/06/2021 1100 Last data filed at 01/06/2021 0202 Gross per 24 hour  Intake 1076.81 ml  Output 625 ml  Net 451.81 ml   Filed Weights   01/02/21 1213 01/02/21 2017  Weight: 72.6 kg 69.6 kg   Body mass index is 22.02 kg/m.   Physical Exam: GENERAL: Patient is  alert awake and oriented. Not in obvious distress. HENT: Mild pallor noted.. Pupils equally reactive to light. Oral mucosa is moist NECK: is supple, no gross swelling noted. CHEST: Clear to auscultation. No crackles or wheezes.  Diminished breath sounds bilaterally. CVS: S1 and S2 heard, no murmur. Regular rate and rhythm.  ABDOMEN: Soft, non-tender, bowel sounds are present. EXTREMITIES: No edema. CNS: Cranial nerves are intact. No focal motor deficits.  Tremors of the extremities. SKIN: warm and dry  without rashes.  Data Review: I have personally reviewed the following laboratory data and studies,  CBC: Recent Labs  Lab 01/04/21 0831 01/04/21 1523 01/05/21 0323 01/05/21 1140 01/05/21 1821 01/06/21 0257  WBC 9.2 9.8 9.1  --  9.0 9.0  NEUTROABS 6.1  --   --   --   --   --   HGB 7.9* 8.1* 6.5* 7.6* 7.7* 7.7*  HCT 24.0* 26.8* 20.3* 23.2* 23.2* 23.2*  MCV 95.2 105.9* 98.1  --  91.3 90.6  PLT 221 236 177  --  145* 976   Basic Metabolic Panel: Recent Labs  Lab 01/02/21 1238 01/03/21 0412 01/04/21 0011 01/04/21 0830  NA 133* 135 133* 135  K 4.0 4.1 3.9 4.0  CL 104 104 103 105  CO2 24 26 24 23   GLUCOSE 95 77 119* 136*  BUN 47* 32* 33* 35*  CREATININE 1.18 0.94 1.06 1.17  CALCIUM 8.0* 8.5* 8.2* 8.1*   Liver Function Tests: Recent Labs  Lab 01/02/21 1238  AST 13*  ALT 8  ALKPHOS 37*  BILITOT 0.5  PROT 5.3*  ALBUMIN 3.1*   No results for input(s): LIPASE, AMYLASE in the last 168 hours. No results for input(s): AMMONIA in the last 168 hours. Cardiac Enzymes: No results for input(s): CKTOTAL, CKMB, CKMBINDEX, TROPONINI in the last 168 hours. BNP (last 3 results) No results for input(s): BNP in the last 8760 hours.  ProBNP (last 3 results) No results for input(s): PROBNP in the last 8760 hours.  CBG: No results for input(s): GLUCAP in the last 168 hours. Recent Results (from the past 240 hour(s))  Resp Panel by RT-PCR (Flu A&B, Covid) Nasopharyngeal Swab     Status: None   Collection Time: 01/02/21  3:45 PM   Specimen: Nasopharyngeal Swab; Nasopharyngeal(NP) swabs in vial transport medium  Result Value Ref Range Status   SARS Coronavirus 2 by RT PCR NEGATIVE NEGATIVE Final    Comment: (NOTE) SARS-CoV-2 target nucleic acids are NOT DETECTED.  The SARS-CoV-2 RNA is generally detectable in upper respiratory specimens during the acute phase of infection. The lowest concentration of SARS-CoV-2 viral copies this assay can detect is 138 copies/mL. A negative  result does not preclude SARS-Cov-2 infection and should not be used as the sole basis for treatment or other patient management decisions. A negative result may occur with  improper specimen collection/handling, submission of specimen other than nasopharyngeal swab, presence of viral mutation(s) within the areas targeted by this assay, and inadequate number of viral copies(<138 copies/mL). A negative result must be combined with clinical observations, patient history, and epidemiological information. The expected result is Negative.  Fact Sheet for Patients:  EntrepreneurPulse.com.au  Fact Sheet for Healthcare Providers:  IncredibleEmployment.be  This test is no t yet approved or cleared by the [place] FDA and  has been authorized for detection and/or diagnosis of SARS-CoV-2 by FDA under an Emergency Use Authorization (EUA). This EUA will remain  in effect (meaning this test can be used) for the duration of the  COVID-19 declaration under Section 564(b)(1) of the Act, 21 U.S.C.section 360bbb-3(b)(1), unless the authorization is terminated  or revoked sooner.       Influenza A by PCR NEGATIVE NEGATIVE Final   Influenza B by PCR NEGATIVE NEGATIVE Final    Comment: (NOTE) The Xpert Xpress SARS-CoV-2/FLU/RSV plus assay is intended as an aid in the diagnosis of influenza from Nasopharyngeal swab specimens and should not be used as a sole basis for treatment. Nasal washings and aspirates are unacceptable for Xpert Xpress SARS-CoV-2/FLU/RSV testing.  Fact Sheet for Patients: EntrepreneurPulse.com.au  Fact Sheet for Healthcare Providers: IncredibleEmployment.be  This test is not yet approved or cleared by the [place] FDA and has been authorized for detection and/or diagnosis of SARS-CoV-2 by FDA under an Emergency Use Authorization (EUA). This EUA will remain in effect (meaning this test can be used)  for the duration of the COVID-19 declaration under Section 564(b)(1) of the Act, 21 U.S.C. section 360bbb-3(b)(1), unless the authorization is terminated or revoked.  Performed at [facility], [place] [address] [place], [place] [address]   MRSA PCR Screening     Status: None   Collection Time: 01/04/21 11:52 AM   Specimen: Nasal Mucosa; Nasopharyngeal  Result Value Ref Range Status   MRSA by PCR NEGATIVE NEGATIVE Final    Comment:        The GeneXpert MRSA Assay (FDA approved for NASAL specimens only), is one component of a comprehensive MRSA colonization surveillance program. It is not intended to diagnose MRSA infection nor to guide or monitor treatment for MRSA infections. Performed at [facility], [place] [address], [place], [place] [address]      Studies: IR Angiogram Visceral Selective  Result Date: 01/04/2021 INDICATION: 70 year old male with a history of lower GI hemorrhage, isolated to the ascending colon on recent CT angiogram, presents for mesenteric angiogram and possible embolization EXAM: ULTRASOUND-GUIDED ACCESS RIGHT COMMON FEMORAL ARTERY MESENTERIC ANGIOGRAM INVOLVING SMA, SUB SELECTION RIGHT COLIC ARTERY, AND SUPER SELECTIVE ANGIOGRAM OF TERMINAL RIGHT COLIC ARTERY BRANCHES EXOSEAL FOR HEMOSTASIS MEDICATIONS: None ANESTHESIA/SEDATION: Moderate (conscious) sedation was employed during this procedure. A total of Versed 1.0 mg and Fentanyl 25 mcg was administered intravenously. Moderate Sedation Time: 39 minutes. The patient's level of consciousness and vital signs were monitored continuously by radiology nursing throughout the procedure under my direct supervision. CONTRAST:  80 cc FLUOROSCOPY TIME:  Fluoroscopy Time: 5 minutes 36 seconds (2,325 mGy). COMPLICATIONS: None PROCEDURE: Informed consent was obtained from the patient following explanation of the procedure, risks, benefits and alternatives. The patient understands, agrees and  consents for the procedure. All questions were addressed. A time out was performed prior to the initiation of the procedure. Maximal barrier sterile technique utilized including caps, mask, sterile gowns, sterile gloves, large sterile drape, hand hygiene, and Betadine prep. Ultrasound survey of the right inguinal region was performed with images stored and sent to PACs, confirming patency of the vessel. A micropuncture needle was used access the right common femoral artery under ultrasound. With excellent arterial blood flow returned, and an .018 micro wire was passed through the needle, observed enter the abdominal aorta under fluoroscopy. The needle was removed, and a micropuncture sheath was placed over the wire. The inner dilator and wire were removed, and an 035 Bentson wire was advanced under fluoroscopy into the abdominal aorta. The sheath was removed and a standard 5 [place] vascular sheath was placed. The dilator was removed and the sheath was flushed. C2 Cobra catheter advanced on  the Bentson wire. Cobra catheter was used to select the SMA. Angiogram was performed. We attempted to advance the C2 catheter on a standard Glidewire, which was unable given the tortuous SMA takeoff. A coaxial high-flow Renegade 135 cm microcatheter was then advanced on 014 fathom wire into the ileocolic artery. Angiogram was performed. The right colic artery was selected. Angiogram was performed. The microcatheter was then used to select terminal branches of the right colic artery independently, with angiogram performed at each artery in multiple obliquities using both breath hold and free breathing technique. Ultimately, we could not isolate the site of hemorrhage correlating to the findings on the CT angiogram as there was no extravasation of count tract, shunting, or angio dysplasia that was target oval for empiric embolization. All catheters wires were removed and Exoseal was deployed for hemostasis. Patient tolerated the  procedure well and remained hemodynamically stable throughout. No complications were encountered and no significant blood loss. IMPRESSION: Status post ultrasound guided access right common femoral artery for SMA angiogram, super selective angiogram the right colic arteries of interest in attempt to isolate the source of recent lower GI hemorrhage. No embolization was performed. ExoSeal for hemostasis. Signed, Dulcy Fanny. Dellia Nims, RPVI [facility] Radiology Electronically Signed   By: Corrie Mckusick D.O.   On: 01/04/2021 13:01   IR Angiogram Selective Each Additional Vessel  Result Date: 01/04/2021 INDICATION: 70 year old male with a history of lower GI hemorrhage, isolated to the ascending colon on recent CT angiogram, presents for mesenteric angiogram and possible embolization EXAM: ULTRASOUND-GUIDED ACCESS RIGHT COMMON FEMORAL ARTERY MESENTERIC ANGIOGRAM INVOLVING SMA, SUB SELECTION RIGHT COLIC ARTERY, AND SUPER SELECTIVE ANGIOGRAM OF TERMINAL RIGHT COLIC ARTERY BRANCHES EXOSEAL FOR HEMOSTASIS MEDICATIONS: None ANESTHESIA/SEDATION: Moderate (conscious) sedation was employed during this procedure. A total of Versed 1.0 mg and Fentanyl 25 mcg was administered intravenously. Moderate Sedation Time: 39 minutes. The patient's level of consciousness and vital signs were monitored continuously by radiology nursing throughout the procedure under my direct supervision. CONTRAST:  80 cc FLUOROSCOPY TIME:  Fluoroscopy Time: 5 minutes 36 seconds (2,325 mGy). COMPLICATIONS: None PROCEDURE: Informed consent was obtained from the patient following explanation of the procedure, risks, benefits and alternatives. The patient understands, agrees and consents for the procedure. All questions were addressed. A time out was performed prior to the initiation of the procedure. Maximal barrier sterile technique utilized including caps, mask, sterile gowns, sterile gloves, large sterile  drape, hand hygiene, and Betadine prep. Ultrasound survey of the right inguinal region was performed with images stored and sent to PACs, confirming patency of the vessel. A micropuncture needle was used access the right common femoral artery under ultrasound. With excellent arterial blood flow returned, and an .018 micro wire was passed through the needle, observed enter the abdominal aorta under fluoroscopy. The needle was removed, and a micropuncture sheath was placed over the wire. The inner dilator and wire were removed, and an 035 Bentson wire was advanced under fluoroscopy into the abdominal aorta. The sheath was removed and a standard 5 [place] vascular sheath was placed. The dilator was removed and the sheath was flushed. C2 Cobra catheter advanced on the Bentson wire. Cobra catheter was used to select the SMA. Angiogram was performed. We attempted to advance the C2 catheter on a standard Glidewire, which was unable given the tortuous SMA takeoff. A coaxial high-flow Renegade 135 cm microcatheter was then advanced on 014 fathom wire into the ileocolic artery. Angiogram was performed. The right colic  artery was selected. Angiogram was performed. The microcatheter was then used to select terminal branches of the right colic artery independently, with angiogram performed at each artery in multiple obliquities using both breath hold and free breathing technique. Ultimately, we could not isolate the site of hemorrhage correlating to the findings on the CT angiogram as there was no extravasation of count tract, shunting, or angio dysplasia that was target oval for empiric embolization. All catheters wires were removed and Exoseal was deployed for hemostasis. Patient tolerated the procedure well and remained hemodynamically stable throughout. No complications were encountered and no significant blood loss. IMPRESSION: Status post ultrasound guided access right common femoral artery for SMA angiogram, super  selective angiogram the right colic arteries of interest in attempt to isolate the source of recent lower GI hemorrhage. No embolization was performed. ExoSeal for hemostasis. Signed, Dulcy Fanny. Dellia Nims, RPVI [facility] Inter[facility] Radiology Special[facility] Radiology Electronically Signed   By: Corrie Mckusick D.O.   On: 01/04/2021 13:01   IR Angiogram Selective Each Additional Vessel  Result Date: 01/04/2021 INDICATION: 69 year old male with a history of lower GI hemorrhage, isolated to the ascending colon on recent CT angiogram, presents for mesenteric angiogram and possible embolization EXAM: ULTRASOUND-GUIDED ACCESS RIGHT COMMON FEMORAL ARTERY MESENTERIC ANGIOGRAM INVOLVING SMA, SUB SELECTION RIGHT COLIC ARTERY, AND SUPER SELECTIVE ANGIOGRAM OF TERMINAL RIGHT COLIC ARTERY BRANCHES EXOSEAL FOR HEMOSTASIS MEDICATIONS: None ANESTHESIA/SEDATION: Moderate (conscious) sedation was employed during this procedure. A total of Versed 1.0 mg and Fentanyl 25 mcg was administered intravenously. Moderate Sedation Time: 39 minutes. The patient's level of consciousness and vital signs were monitored continuously by radiology nursing throughout the procedure under my direct supervision. CONTRAST:  80 cc FLUOROSCOPY TIME:  Fluoroscopy Time: 5 minutes 36 seconds (2,325 mGy). COMPLICATIONS: None PROCEDURE: Informed consent was obtained from the patient following explanation of the procedure, risks, benefits and alternatives. The patient understands, agrees and consents for the procedure. All questions were addressed. A time out was performed prior to the initiation of the procedure. Maximal barrier sterile technique utilized including caps, mask, sterile gowns, sterile gloves, large sterile drape, hand hygiene, and Betadine prep. Ultrasound survey of the right inguinal region was performed with images stored and sent to PACs, confirming patency of the vessel. A micropuncture needle was used access the right common  femoral artery under ultrasound. With excellent arterial blood flow returned, and an .018 micro wire was passed through the needle, observed enter the abdominal aorta under fluoroscopy. The needle was removed, and a micropuncture sheath was placed over the wire. The inner dilator and wire were removed, and an 035 Bentson wire was advanced under fluoroscopy into the abdominal aorta. The sheath was removed and a standard 5 [place] vascular sheath was placed. The dilator was removed and the sheath was flushed. C2 Cobra catheter advanced on the Bentson wire. Cobra catheter was used to select the SMA. Angiogram was performed. We attempted to advance the C2 catheter on a standard Glidewire, which was unable given the tortuous SMA takeoff. A coaxial high-flow Renegade 135 cm microcatheter was then advanced on 014 fathom wire into the ileocolic artery. Angiogram was performed. The right colic artery was selected. Angiogram was performed. The microcatheter was then used to select terminal branches of the right colic artery independently, with angiogram performed at each artery in multiple obliquities using both breath hold and free breathing technique. Ultimately, we could not isolate the site of hemorrhage correlating to the findings on the CT angiogram as there  was no extravasation of count tract, shunting, or angio dysplasia that was target oval for empiric embolization. All catheters wires were removed and Exoseal was deployed for hemostasis. Patient tolerated the procedure well and remained hemodynamically stable throughout. No complications were encountered and no significant blood loss. IMPRESSION: Status post ultrasound guided access right common femoral artery for SMA angiogram, super selective angiogram the right colic arteries of interest in attempt to isolate the source of recent lower GI hemorrhage. No embolization was performed. ExoSeal for hemostasis. Signed, Dulcy Fanny. Dellia Nims, RPVI [facility]  Inter[facility] Radiology Specialists [facility] Radiology Electronically Signed   By: Corrie Mckusick D.O.   On: 01/04/2021 13:01   IR US Guide Vasc Access Right  Result Date: 01/04/2021 INDICATION: 70 year old male with a history of lower GI hemorrhage, isolated to the ascending colon on recent CT angiogram, presents for mesenteric angiogram and possible embolization EXAM: ULTRASOUND-GUIDED ACCESS RIGHT COMMON FEMORAL ARTERY MESENTERIC ANGIOGRAM INVOLVING SMA, SUB SELECTION RIGHT COLIC ARTERY, AND SUPER SELECTIVE ANGIOGRAM OF TERMINAL RIGHT COLIC ARTERY BRANCHES EXOSEAL FOR HEMOSTASIS MEDICATIONS: None ANESTHESIA/SEDATION: Moderate (conscious) sedation was employed during this procedure. A total of Versed 1.0 mg and Fentanyl 25 mcg was administered intravenously. Moderate Sedation Time: 39 minutes. The patient's level of consciousness and vital signs were monitored continuously by radiology nursing throughout the procedure under my direct supervision. CONTRAST:  80 cc FLUOROSCOPY TIME:  Fluoroscopy Time: 5 minutes 36 seconds (2,325 mGy). COMPLICATIONS: None PROCEDURE: Informed consent was obtained from the patient following explanation of the procedure, risks, benefits and alternatives. The patient understands, agrees and consents for the procedure. All questions were addressed. A time out was performed prior to the initiation of the procedure. Maximal barrier sterile technique utilized including caps, mask, sterile gowns, sterile gloves, large sterile drape, hand hygiene, and Betadine prep. Ultrasound survey of the right inguinal region was performed with images stored and sent to PACs, confirming patency of the vessel. A micropuncture needle was used access the right common femoral artery under ultrasound. With excellent arterial blood flow returned, and an .018 micro wire was passed through the needle, observed enter the abdominal aorta under fluoroscopy. The needle was removed, and a micropuncture sheath was  placed over the wire. The inner dilator and wire were removed, and an 035 Bentson wire was advanced under fluoroscopy into the abdominal aorta. The sheath was removed and a standard 5 [place] vascular sheath was placed. The dilator was removed and the sheath was flushed. C2 Cobra catheter advanced on the Bentson wire. Cobra catheter was used to select the SMA. Angiogram was performed. We attempted to advance the C2 catheter on a standard Glidewire, which was unable given the tortuous SMA takeoff. A coaxial high-flow Renegade 135 cm microcatheter was then advanced on 014 fathom wire into the ileocolic artery. Angiogram was performed. The right colic artery was selected. Angiogram was performed. The microcatheter was then used to select terminal branches of the right colic artery independently, with angiogram performed at each artery in multiple obliquities using both breath hold and free breathing technique. Ultimately, we could not isolate the site of hemorrhage correlating to the findings on the CT angiogram as there was no extravasation of count tract, shunting, or angio dysplasia that was target oval for empiric embolization. All catheters wires were removed and Exoseal was deployed for hemostasis. Patient tolerated the procedure well and remained hemodynamically stable throughout. No complications were encountered and no significant blood loss. IMPRESSION: Status post ultrasound guided access right common femoral artery  for SMA angiogram, super selective angiogram the right colic arteries of interest in attempt to isolate the source of recent lower GI hemorrhage. No embolization was performed. ExoSeal for hemostasis. Signed, Dulcy Fanny. Dellia Nims, RPVI [facility]iology Special[facility] Radiology Electronically Signed   By: Corrie Mckusick D.O.   On: 01/04/2021 13:01      Flora Lipps, MD  [facility] 01/06/2021  If 7PM-7AM, please contact night-coverage

## 2021-01-06 NOTE — Progress Notes (Addendum)
Progress Note  Patient Name: [name] Date of Encounter: 01/06/2021  Primary Cardiologist: Glenetta Hew, MD  Subjective   Some confusion noted in GI notes earlier today, but patient is A+Ox3 upon exam currently. Denies any CP or dyspnea currently. Felt some chest pain earlier today but resolved spontaneously. Abdomen feels a little sore - per GI note, had a melenic BM today - they recommend continued monitoring, transfusion if needed, with IR reevaluation if patient develops destabilizing bleed.   Inpatient Medications    Scheduled Meds:  sodium chloride   Intravenous Once   alfuzosin  10 mg Oral Daily   carbidopa-levodopa  1.5 tablet Oral TID   Chlorhexidine Gluconate Cloth  6 each Topical Daily   citalopram  20 mg Oral Daily   docusate sodium  200 mg Oral Daily   hydrALAZINE  10 mg Intravenous Once   isosorbide mononitrate  30 mg Oral Daily   mouth rinse  15 mL Mouth Rinse BID   metoprolol tartrate  50 mg Oral BID    morphine injection  2 mg Intravenous Once   multivitamin with minerals  1 tablet Oral Daily   pantoprazole (PROTONIX) IV  40 mg Intravenous Q12H   polyethylene glycol  17 g Oral Daily   simvastatin  40 mg Oral QHS   timolol  1 drop Both Eyes Daily   trihexyphenidyl  1 mg Oral BID WC   Continuous Infusions:  PRN Meds: acetaminophen, bismuth subsalicylate, nitroGLYCERIN, sodium chloride   Vital Signs    Vitals:   01/06/21 0629 01/06/21 0800 01/06/21 0809 01/06/21 0823  BP: (!) 186/97 (!) 177/103 (!) 142/84 (!) 149/99  Pulse: 69 66 63 60  Resp: 15 12 14 14   Temp:  98.1 F (36.7 C)    TempSrc:  Oral    SpO2: 100% 100% 95% 100%  Weight:      Height:        Intake/Output Summary (Last 24 hours) at 01/06/2021 1029 Last data filed at 01/06/2021 0202 Gross per 24 hour  Intake 1076.81 ml  Output 725 ml  Net 351.81 ml   Last 3 Weights 01/02/2021 01/02/2021 12/24/2020  Weight (lbs) 153 lb 7 oz 160 lb 157 lb  Weight (kg) 69.6 kg 72.576 kg 71.215 kg      Telemetry    NSR - Personally Reviewed  ECG    First EKG yesterday AM was confounded by artifact but showed NSR with definite ST elevation V3-V6, most significant in V4, possibly subtly in I, aVL as well Second EKG yesterday showed NSR 83bpm, anteroseptal TWI V3-V6, nonspecific flattening otherwise - Personally Reviewed  Physical Exam   GEN: No acute distress.  HEENT: Normocephalic, atraumatic, sclera non-icteric. Neck: No JVD or bruits. Cardiac: RRR no murmurs, rubs, or gallops.  Respiratory: Clear to auscultation bilaterally. Breathing is unlabored. GI: Soft, nontender, non-distended, BS +x 4. MS: no deformity. Extremities: No clubbing or cyanosis. No edema. Distal pedal pulses are 2+ and equal bilaterally. Neuro:  AAOx3. Follows commands. Psych:  Responds to questions appropriately with a normal affect.  Labs    High Sensitivity Troponin:   Recent Labs  Lab 01/04/21 1523 01/04/21 1807 01/04/21 1930 01/05/21 0323 01/05/21 0735  TROPONINIHS 20* 34* 46* 93* 110*      Cardiac EnzymesNo results for input(s): TROPONINI in the last 168 hours. No results for input(s): TROPIPOC in the last 168 hours.   Chemistry Recent Labs  Lab 01/02/21 1238 01/03/21 0412 01/04/21 0011 01/04/21 0830  NA  133* 135 133* 135  K 4.0 4.1 3.9 4.0  CL 104 104 103 105  CO2 24 26 24 23   GLUCOSE 95 77 119* 136*  BUN 47* 32* 33* 35*  CREATININE 1.18 0.94 1.06 1.17  CALCIUM 8.0* 8.5* 8.2* 8.1*  PROT 5.3*  --   --   --   ALBUMIN 3.1*  --   --   --   AST 13*  --   --   --   ALT 8  --   --   --   ALKPHOS 37*  --   --   --   BILITOT 0.5  --   --   --   GFRNONAA >60 >60 >60 >60  ANIONGAP 5 5 6 7      Hematology Recent Labs  Lab 01/05/21 0323 01/05/21 1140 01/05/21 1821 01/06/21 0257  WBC 9.1  --  9.0 9.0  RBC 2.07*  --  2.54* 2.56*  HGB 6.5* 7.6* 7.7* 7.7*  HCT 20.3* 23.2* 23.2* 23.2*  MCV 98.1  --  91.3 90.6  MCH 31.4  --  30.3 30.1  MCHC 32.0  --  33.2 33.2  RDW 12.5  --   15.1 15.5  PLT 177  --  145* 164    BNPNo results for input(s): BNP, PROBNP in the last 168 hours.   DDimer No results for input(s): DDIMER in the last 168 hours.   Radiology    IR Angiogram Visceral Selective  Result Date: 01/04/2021 INDICATION: 70 year old male with a history of lower GI hemorrhage, isolated to the ascending colon on recent CT angiogram, presents for mesenteric angiogram and possible embolization EXAM: ULTRASOUND-GUIDED ACCESS RIGHT COMMON FEMORAL ARTERY MESENTERIC ANGIOGRAM INVOLVING SMA, SUB SELECTION RIGHT COLIC ARTERY, AND SUPER SELECTIVE ANGIOGRAM OF TERMINAL RIGHT COLIC ARTERY BRANCHES EXOSEAL FOR HEMOSTASIS MEDICATIONS: None ANESTHESIA/SEDATION: Moderate (conscious) sedation was employed during this procedure. A total of Versed 1.0 mg and Fentanyl 25 mcg was administered intravenously. Moderate Sedation Time: 39 minutes. The patient's level of consciousness and vital signs were monitored continuously by radiology nursing throughout the procedure under my direct supervision. CONTRAST:  80 cc FLUOROSCOPY TIME:  Fluoroscopy Time: 5 minutes 36 seconds (2,325 mGy). COMPLICATIONS: None PROCEDURE: Informed consent was obtained from the patient following explanation of the procedure, risks, benefits and alternatives. The patient understands, agrees and consents for the procedure. All questions were addressed. A time out was performed prior to the initiation of the procedure. Maximal barrier sterile technique utilized including caps, mask, sterile gowns, sterile gloves, large sterile drape, hand hygiene, and Betadine prep. Ultrasound survey of the right inguinal region was performed with images stored and sent to PACs, confirming patency of the vessel. A micropuncture needle was used access the right common femoral artery under ultrasound. With excellent arterial blood flow returned, and an .018 micro wire was passed through the needle, observed enter the abdominal aorta under  fluoroscopy. The needle was removed, and a micropuncture sheath was placed over the wire. The inner dilator and wire were removed, and an 035 Bentson wire was advanced under fluoroscopy into the abdominal aorta. The sheath was removed and a standard 5 [place] vascular sheath was placed. The dilator was removed and the sheath was flushed. C2 Cobra catheter advanced on the Bentson wire. Cobra catheter was used to select the SMA. Angiogram was performed. We attempted to advance the C2 catheter on a standard Glidewire, which was unable given the tortuous SMA takeoff. A coaxial high-flow Renegade 135 cm microcatheter was  then advanced on 014 fathom wire into the ileocolic artery. Angiogram was performed. The right colic artery was selected. Angiogram was performed. The microcatheter was then used to select terminal branches of the right colic artery independently, with angiogram performed at each artery in multiple obliquities using both breath hold and free breathing technique. Ultimately, we could not isolate the site of hemorrhage correlating to the findings on the CT angiogram as there was no extravasation of count tract, shunting, or angio dysplasia that was target oval for empiric embolization. All catheters wires were removed and Exoseal was deployed for hemostasis. Patient tolerated the procedure well and remained hemodynamically stable throughout. No complications were encountered and no significant blood loss. IMPRESSION: Status post ultrasound guided access right common femoral artery for SMA angiogram, super selective angiogram the right colic arteries of interest in attempt to isolate the source of recent lower GI hemorrhage. No embolization was performed. ExoSeal for hemostasis. Signed, Dulcy Fanny. Dellia Nims, RPVI [facility] Radiology Electronically Signed   By: Corrie Mckusick D.O.   On: 01/04/2021 13:01   IR Angiogram Selective Each Additional  Vessel  Result Date: 01/04/2021 INDICATION: 70 year old male with a history of lower GI hemorrhage, isolated to the ascending colon on recent CT angiogram, presents for mesenteric angiogram and possible embolization EXAM: ULTRASOUND-GUIDED ACCESS RIGHT COMMON FEMORAL ARTERY MESENTERIC ANGIOGRAM INVOLVING SMA, SUB SELECTION RIGHT COLIC ARTERY, AND SUPER SELECTIVE ANGIOGRAM OF TERMINAL RIGHT COLIC ARTERY BRANCHES EXOSEAL FOR HEMOSTASIS MEDICATIONS: None ANESTHESIA/SEDATION: Moderate (conscious) sedation was employed during this procedure. A total of Versed 1.0 mg and Fentanyl 25 mcg was administered intravenously. Moderate Sedation Time: 39 minutes. The patient's level of consciousness and vital signs were monitored continuously by radiology nursing throughout the procedure under my direct supervision. CONTRAST:  80 cc FLUOROSCOPY TIME:  Fluoroscopy Time: 5 minutes 36 seconds (2,325 mGy). COMPLICATIONS: None PROCEDURE: Informed consent was obtained from the patient following explanation of the procedure, risks, benefits and alternatives. The patient understands, agrees and consents for the procedure. All questions were addressed. A time out was performed prior to the initiation of the procedure. Maximal barrier sterile technique utilized including caps, mask, sterile gowns, sterile gloves, large sterile drape, hand hygiene, and Betadine prep. Ultrasound survey of the right inguinal region was performed with images stored and sent to PACs, confirming patency of the vessel. A micropuncture needle was used access the right common femoral artery under ultrasound. With excellent arterial blood flow returned, and an .018 micro wire was passed through the needle, observed enter the abdominal aorta under fluoroscopy. The needle was removed, and a micropuncture sheath was placed over the wire. The inner dilator and wire were removed, and an 035 Bentson wire was advanced under fluoroscopy into the abdominal aorta. The sheath  was removed and a standard 5 [place] vascular sheath was placed. The dilator was removed and the sheath was flushed. C2 Cobra catheter advanced on the Bentson wire. Cobra catheter was used to select the SMA. Angiogram was performed. We attempted to advance the C2 catheter on a standard Glidewire, which was unable given the tortuous SMA takeoff. A coaxial high-flow Renegade 135 cm microcatheter was then advanced on 014 fathom wire into the ileocolic artery. Angiogram was performed. The right colic artery was selected. Angiogram was performed. The microcatheter was then used to select terminal branches of the right colic artery independently, with angiogram performed at each artery in multiple obliquities using both breath hold and free breathing technique. Ultimately, we could  not isolate the site of hemorrhage correlating to the findings on the CT angiogram as there was no extravasation of count tract, shunting, or angio dysplasia that was target oval for empiric embolization. All catheters wires were removed and Exoseal was deployed for hemostasis. Patient tolerated the procedure well and remained hemodynamically stable throughout. No complications were encountered and no significant blood loss. IMPRESSION: Status post ultrasound guided access right common femoral artery for SMA angiogram, super selective angiogram the right colic arteries of interest in attempt to isolate the source of recent lower GI hemorrhage. No embolization was performed. ExoSeal for hemostasis. Signed, Dulcy Fanny. Dellia Nims, RPVI [facility] Inter[facility] Radiology Special[facility] Radiology Electronically Signed   By: Corrie Mckusick D.O.   On: 01/04/2021 13:01   IR Angiogram Selective Each Additional Vessel  Result Date: 01/04/2021 INDICATION: 70 year old male with a history of lower GI hemorrhage, isolated to the ascending colon on recent CT angiogram, presents for mesenteric angiogram and possible embolization EXAM:  ULTRASOUND-GUIDED ACCESS RIGHT COMMON FEMORAL ARTERY MESENTERIC ANGIOGRAM INVOLVING SMA, SUB SELECTION RIGHT COLIC ARTERY, AND SUPER SELECTIVE ANGIOGRAM OF TERMINAL RIGHT COLIC ARTERY BRANCHES EXOSEAL FOR HEMOSTASIS MEDICATIONS: None ANESTHESIA/SEDATION: Moderate (conscious) sedation was employed during this procedure. A total of Versed 1.0 mg and Fentanyl 25 mcg was administered intravenously. Moderate Sedation Time: 39 minutes. The patient's level of consciousness and vital signs were monitored continuously by radiology nursing throughout the procedure under my direct supervision. CONTRAST:  80 cc FLUOROSCOPY TIME:  Fluoroscopy Time: 5 minutes 36 seconds (2,325 mGy). COMPLICATIONS: None PROCEDURE: Informed consent was obtained from the patient following explanation of the procedure, risks, benefits and alternatives. The patient understands, agrees and consents for the procedure. All questions were addressed. A time out was performed prior to the initiation of the procedure. Maximal barrier sterile technique utilized including caps, mask, sterile gowns, sterile gloves, large sterile drape, hand hygiene, and Betadine prep. Ultrasound survey of the right inguinal region was performed with images stored and sent to PACs, confirming patency of the vessel. A micropuncture needle was used access the right common femoral artery under ultrasound. With excellent arterial blood flow returned, and an .018 micro wire was passed through the needle, observed enter the abdominal aorta under fluoroscopy. The needle was removed, and a micropuncture sheath was placed over the wire. The inner dilator and wire were removed, and an 035 Bentson wire was advanced under fluoroscopy into the abdominal aorta. The sheath was removed and a standard 5 [place] vascular sheath was placed. The dilator was removed and the sheath was flushed. C2 Cobra catheter advanced on the Bentson wire. Cobra catheter was used to select the SMA. Angiogram was  performed. We attempted to advance the C2 catheter on a standard Glidewire, which was unable given the tortuous SMA takeoff. A coaxial high-flow Renegade 135 cm microcatheter was then advanced on 014 fathom wire into the ileocolic artery. Angiogram was performed. The right colic artery was selected. Angiogram was performed. The microcatheter was then used to select terminal branches of the right colic artery independently, with angiogram performed at each artery in multiple obliquities using both breath hold and free breathing technique. Ultimately, we could not isolate the site of hemorrhage correlating to the findings on the CT angiogram as there was no extravasation of count tract, shunting, or angio dysplasia that was target oval for empiric embolization. All catheters wires were removed and Exoseal was deployed for hemostasis. Patient tolerated the procedure well and remained hemodynamically stable throughout. No complications were  encountered and no significant blood loss. IMPRESSION: Status post ultrasound guided access right common femoral artery for SMA angiogram, super selective angiogram the right colic arteries of interest in attempt to isolate the source of recent lower GI hemorrhage. No embolization was performed. ExoSeal for hemostasis. Signed, Dulcy Fanny. Dellia Nims, RPVI [facility] Inter[facility] Radiology Specialists [facility] Radiology Electronically Signed   By: Corrie Mckusick D.O.   On: 01/04/2021 13:01   IR US Guide Vasc Access Right  Result Date: 01/04/2021 INDICATION: 70 year old male with a history of lower GI hemorrhage, isolated to the ascending colon on recent CT angiogram, presents for mesenteric angiogram and possible embolization EXAM: ULTRASOUND-GUIDED ACCESS RIGHT COMMON FEMORAL ARTERY MESENTERIC ANGIOGRAM INVOLVING SMA, SUB SELECTION RIGHT COLIC ARTERY, AND SUPER SELECTIVE ANGIOGRAM OF TERMINAL RIGHT COLIC ARTERY BRANCHES EXOSEAL FOR HEMOSTASIS MEDICATIONS: None  ANESTHESIA/SEDATION: Moderate (conscious) sedation was employed during this procedure. A total of Versed 1.0 mg and Fentanyl 25 mcg was administered intravenously. Moderate Sedation Time: 39 minutes. The patient's level of consciousness and vital signs were monitored continuously by radiology nursing throughout the procedure under my direct supervision. CONTRAST:  80 cc FLUOROSCOPY TIME:  Fluoroscopy Time: 5 minutes 36 seconds (2,325 mGy). COMPLICATIONS: None PROCEDURE: Informed consent was obtained from the patient following explanation of the procedure, risks, benefits and alternatives. The patient understands, agrees and consents for the procedure. All questions were addressed. A time out was performed prior to the initiation of the procedure. Maximal barrier sterile technique utilized including caps, mask, sterile gowns, sterile gloves, large sterile drape, hand hygiene, and Betadine prep. Ultrasound survey of the right inguinal region was performed with images stored and sent to PACs, confirming patency of the vessel. A micropuncture needle was used access the right common femoral artery under ultrasound. With excellent arterial blood flow returned, and an .018 micro wire was passed through the needle, observed enter the abdominal aorta under fluoroscopy. The needle was removed, and a micropuncture sheath was placed over the wire. The inner dilator and wire were removed, and an 035 Bentson wire was advanced under fluoroscopy into the abdominal aorta. The sheath was removed and a standard 5 [place] vascular sheath was placed. The dilator was removed and the sheath was flushed. C2 Cobra catheter advanced on the Bentson wire. Cobra catheter was used to select the SMA. Angiogram was performed. We attempted to advance the C2 catheter on a standard Glidewire, which was unable given the tortuous SMA takeoff. A coaxial high-flow Renegade 135 cm microcatheter was then advanced on 014 fathom wire into the ileocolic  artery. Angiogram was performed. The right colic artery was selected. Angiogram was performed. The microcatheter was then used to select terminal branches of the right colic artery independently, with angiogram performed at each artery in multiple obliquities using both breath hold and free breathing technique. Ultimately, we could not isolate the site of hemorrhage correlating to the findings on the CT angiogram as there was no extravasation of count tract, shunting, or angio dysplasia that was target oval for empiric embolization. All catheters wires were removed and Exoseal was deployed for hemostasis. Patient tolerated the procedure well and remained hemodynamically stable throughout. No complications were encountered and no significant blood loss. IMPRESSION: Status post ultrasound guided access right common femoral artery for SMA angiogram, super selective angiogram the right colic arteries of interest in attempt to isolate the source of recent lower GI hemorrhage. No embolization was performed. ExoSeal for hemostasis. Signed, Dulcy Fanny. Dellia Nims, [place] [facility]  [facility] Radiology Electronically Signed   By: Corrie Mckusick D.O.   On: 01/04/2021 13:01    Cardiac Studies   None this admission  Patient Profile     70 y.o. male with HTN, HLD, BPH, diverticulosis, GERD/esophageal spasm, gout, glaucoma, hepatitis C, aortic atherosclerosis, thoracic aortic aneurysm (4.2cm in 09/2016), ? Parkinson's who was admitted with GIB. He has followed with Dr. Ellyn Hack with reported cath 2005 with minimal CAD, last stress test 2017 felt to be low risk with small fixed basal lateral defect felt to represent artifact, otherwise normal perfusion. He as recently seen as OP 11/2020 with atypical chest pain, planned for coronary CTA. Imdur added to verapamil. In the meantime he presented with melena/maroon colored stools and ABL anemia. GI has seen and felt this is likely diverticular  bleeding - unable to undergo IR embolization as no bleeding site was identified. During admission has had hypotension necessitating volume resuscitation. Cardiology consulted for chest pain/abnormal EKG.  Assessment & Plan    1. Acute GIB with ABL anemia - most recent Hgb 7.7 - GI recommending supportive care with reattempting IR if he develops recurrent bleed  2. Abnormal EKG/chest pain - initial EKG yesterday AM captured transient ST elevation V3-V6, resolved in follow-up tracing but accompanied by TWI in these leads - initial troponins neg x 2 then 20->34->46->93->110 with last value this AM - will hold off trending another troponin given that all values have remained relatively low despite multiple checks to preserve blood draws given anemia - I reviewed with Dr. Marlou Porch given the the transient ST elevation - unclear if this represents a true ACS pattern vs perhaps a Prinzmetal's angina/coronary vasospasm event - we will restart his home verapamil but move to AM dosing since BP has improved - he is not currently a candidate for invasive evaluation given recent anemia/GIB - for this reason we cannot presently treat with ASA, Plavix or heparin due to risk of worsening life threatening bleed - we'll trend EKG this AM - order echocardiogram - already on BB, statin, Imdur  3. Essential HTN with hypotension this admission - during admission has had hypotension necessitating volume resuscitation, now trending hypertensive - prior to admission meds: verapamil 240mg  daily, Imdur 30mg  daily (metoprolol on home MAR was a pre-coronary CT dose, not routine med), Avapro 300mg  daily - current regimen: metoprolol tartrate 50mg  BID, Imdur 30mg  - add back verapamil as above and follow  4. HLD - the max recommended dose of simvastatin with verapamil is 10mg  daily - previous intolerance noted to Crestor and simvastatin although had been tolerating simvastatin at high intensity 40mg /daily. Previously on  Livalo as well but not clear what happened to this - will switch to trial of atorvastatin 40mg  - had not tried in previous records - f/u lipids in AM - if the patient is tolerating statin at time of follow-up appointment, would consider rechecking liver function/lipid panel in 6-8 weeks. If he does not tolerate atorvastatin will need referral to lipid clinic for consideration of [id number]  5. H/o dilation of ascending thoracic aorta by CT 2018 - do not see chest imaging since that time - reviewed with Dr. Marlou Porch - await echocardiogram for follow-up dimensions   For questions or updates, please contact [facility] Please consult [email or web address] for contact info under Cardiology/STEMI.  Signed, Charlie Pitter, PA-C 01/06/2021, 10:29 AM    Personally seen and examined. Agree with above.  71 year old male with acute GI bleed with concomitant anemia requiring blood products/resuscitation.  Troponins  increased from 20 up to 110. Currently without any chest discomfort.  No dyspnea.  Earlier today felt mild chest discomfort that resolved.  Melanotic bowel movement.  On exam: Fairly comfortable in bed, heart regular rate and rhythm, no scleral icterus, lungs are clear, follows commands.  EKGs: Multiple personally reviewed and interpreted.  Yesterday there were severe ST elevations noted in the anterior precordial leads transiently which then resolved to T wave inversion in the anterior leads.  This morning's EKGs show T wave inversions once again in the anterior precordial leads not quite as significant as previous.  Once again troponin only increased to 100.  Assessment and plan:  Transient ST elevation - Given the low level troponin increase, this represents vasospastic phenomenon/Prinzmetal's angina. - We have restarted his verapamil which should help with this.  We have also started him back on his isosorbide 30 mg daily. - I am fine with him taking his metoprolol 50 mg twice a day as well. -  This is not an ST elevation myocardial infarction.  This is transient vasospasm. - Nevertheless we will continue with medications as above as well as statin therapy.  He would not be a candidate for IV heparin aspirin or Plavix obviously given his concomitant GI bleed. - If further procedures are necessary, he may proceed with moderate overall cardiac risk. - Agree with switching him to atorvastatin 40 mg.  He has had statin intolerance in the past to other medications.  We do not want a give him high-dose simvastatin given his concomitant verapamil use which can increase serum simvastatin loads.  Mildly dilated thoracic aorta - 4.2 cm on prior CT scan in 2018.  Continue to monitor.  Awaiting echocardiogram.  CRITICAL CARE Performed by: Candee Furbish   Total critical care time: 35 minutes  Critical care time was exclusive of separately billable procedures and treating other patients.  Critical care was necessary to treat or prevent imminent or life-threatening deterioration.  Critical care was time spent personally by me on the following activities: development of treatment plan with patient and/or surrogate as well as nursing, discussions with consultants, evaluation of patient's response to treatment, examination of patient, obtaining history from patient or surrogate, ordering and performing treatments and interventions, ordering and review of laboratory studies, ordering and review of radiographic studies, pulse oximetry and re-evaluation of patient's condition.   Candee Furbish, MD

## 2021-01-06 NOTE — Progress Notes (Signed)
Patient had 2 medium size dark tarry stools overnight

## 2021-01-06 NOTE — Progress Notes (Signed)
Notified our outpatient coronary CT team of patient's admission, they're following peripherally (that way they do not call him to schedule while he is hospitalized).

## 2021-01-06 NOTE — Progress Notes (Signed)
[facility] Progress Note  [name] 70 y.o. 04-04-1951  CC: Rectal bleeding  Subjective: Patient confused today ([place]  Reports lethargy but denies abdominal pain. Had a melenic BM today.  ROS :Review of Systems  Cardiovascular:  Negative for chest pain and palpitations.  Gastrointestinal:  Positive for melena. Negative for abdominal pain, blood in stool, constipation, diarrhea, heartburn, nausea and vomiting.    Objective: Vital signs in last 24 hours: Vitals:   01/06/21 0809 01/06/21 0823  BP: (!) 142/84 (!) 149/99  Pulse: 63 60  Resp: 14 14  Temp:    SpO2: 95% 100%    Physical Exam:  General:  Lethargic, ill-appearing, cooperative but confused  Head:  Normocephalic, without obvious abnormality, atraumatic  Eyes:  Conjunctival pallor, EOMs intact  Lungs:   Clear to auscultation bilaterally, respirations unlabored  Heart:  Regular rate and rhythm, S1, S2 normal  Abdomen:   Soft, non-tender, non-distended, bowel sounds active all four quadrants  Extremities: Extremities normal, atraumatic, no  edema    Lab Results: Recent Labs    01/04/21 0011 01/04/21 0830  NA 133* 135  K 3.9 4.0  CL 103 105  CO2 24 23  GLUCOSE 119* 136*  BUN 33* 35*  CREATININE 1.06 1.17  CALCIUM 8.2* 8.1*   No results for input(s): AST, ALT, ALKPHOS, BILITOT, PROT, ALBUMIN in the last 72 hours. Recent Labs    01/04/21 0831 01/04/21 1523 01/05/21 1821 01/06/21 0257  WBC 9.2   < > 9.0 9.0  NEUTROABS 6.1  --   --   --   HGB 7.9*   < > 7.7* 7.7*  HCT 24.0*   < > 23.2* 23.2*  MCV 95.2   < > 91.3 90.6  PLT 221   < > 145* 164   < > = values in this interval not displayed.   No results for input(s): LABPROT, INR in the last 72 hours.    Assessment: Rectal bleeding, most likely diverticular bleeding.  Unable to undergo IR embolization as no bleeding site was identified.  Melenic stool overnight, possibly a sign of old blood being  passed. -Hgb 7.7, stable (had 2u pRBCs yesterday with post-transfusion Hgb 7.6)  Delirium  Plan: Continue supportive care. Continue to monitor H&H with transfusion as needed to maintain Hgb >7.  If recurrent destabilizing bleeding, proceed with CT-A, followed by IR consultation if positive.  [facility] will follow.   [name] PA-C 01/06/2021, 10:35 AM  Contact #  [phone]

## 2021-01-07 ENCOUNTER — Inpatient Hospital Stay (HOSPITAL_COMMUNITY): Payer: Medicare Other

## 2021-01-07 DIAGNOSIS — K921 Melena: Secondary | ICD-10-CM

## 2021-01-07 DIAGNOSIS — K922 Gastrointestinal hemorrhage, unspecified: Secondary | ICD-10-CM | POA: Diagnosis not present

## 2021-01-07 DIAGNOSIS — R778 Other specified abnormalities of plasma proteins: Secondary | ICD-10-CM

## 2021-01-07 DIAGNOSIS — I10 Essential (primary) hypertension: Secondary | ICD-10-CM | POA: Diagnosis not present

## 2021-01-07 DIAGNOSIS — R079 Chest pain, unspecified: Secondary | ICD-10-CM

## 2021-01-07 DIAGNOSIS — D62 Acute posthemorrhagic anemia: Secondary | ICD-10-CM | POA: Diagnosis not present

## 2021-01-07 HISTORY — PX: TRANSTHORACIC ECHOCARDIOGRAM: SHX275

## 2021-01-07 LAB — LIPID PANEL
Cholesterol: 76 mg/dL (ref 0–200)
HDL: 35 mg/dL — ABNORMAL LOW (ref >40)
LDL Cholesterol: 34 mg/dL (ref 0–99)
Total CHOL/HDL Ratio: 2.2 RATIO
Triglycerides: 36 mg/dL (ref <150)
VLDL: 7 mg/dL (ref 0–40)

## 2021-01-07 LAB — CBC
HCT: 24 % — ABNORMAL LOW (ref 39.0–52.0)
Hemoglobin: 7.8 g/dL — ABNORMAL LOW (ref 13.0–17.0)
MCH: 30.1 pg (ref 26.0–34.0)
MCHC: 32.5 g/dL (ref 30.0–36.0)
MCV: 92.7 fL (ref 80.0–100.0)
Platelets: 164 10*3/uL (ref 150–400)
RBC: 2.59 MIL/uL — ABNORMAL LOW (ref 4.22–5.81)
RDW: 15 % (ref 11.5–15.5)
WBC: 10.5 10*3/uL (ref 4.0–10.5)
nRBC: 0 % (ref 0.0–0.2)

## 2021-01-07 LAB — ECHOCARDIOGRAM COMPLETE
AR max vel: 2.61 cm2
AV Area VTI: 2.71 cm2
AV Area mean vel: 2.48 cm2
AV Mean grad: 4.5 mmHg
AV Peak grad: 8 mmHg
Ao pk vel: 1.41 m/s
Area-P 1/2: 2.63 cm2
Calc EF: 68.5 %
Height: 70 in
P 1/2 time: 1040 msec
S' Lateral: 2.7 cm
Single Plane A2C EF: 65.4 %
Single Plane A4C EF: 70.6 %
Weight: 2455.04 oz

## 2021-01-07 LAB — BASIC METABOLIC PANEL
Anion gap: 4 — ABNORMAL LOW (ref 5–15)
BUN: 24 mg/dL — ABNORMAL HIGH (ref 8–23)
CO2: 25 mmol/L (ref 22–32)
Calcium: 8.1 mg/dL — ABNORMAL LOW (ref 8.9–10.3)
Chloride: 109 mmol/L (ref 98–111)
Creatinine, Ser: 0.99 mg/dL (ref 0.61–1.24)
GFR, Estimated: >60 mL/min (ref >60)
Glucose, Bld: 102 mg/dL — ABNORMAL HIGH (ref 70–99)
Potassium: 4.5 mmol/L (ref 3.5–5.1)
Sodium: 138 mmol/L (ref 135–145)

## 2021-01-07 LAB — HEMOGLOBIN AND HEMATOCRIT, BLOOD
HCT: 22.5 % — ABNORMAL LOW (ref 39.0–52.0)
Hemoglobin: 7.4 g/dL — ABNORMAL LOW (ref 13.0–17.0)

## 2021-01-07 LAB — PHOSPHORUS: Phosphorus: 2.9 mg/dL (ref 2.5–4.6)

## 2021-01-07 LAB — MAGNESIUM: Magnesium: 2 mg/dL (ref 1.7–2.4)

## 2021-01-07 MED ORDER — POLYETHYLENE GLYCOL 3350 17 G PO PACK: 17.0000 g | PACK | Freq: Every day | ORAL | Status: DC | PRN

## 2021-01-07 MED ORDER — DOCUSATE SODIUM 100 MG PO CAPS: 100.0000 mg | ORAL_CAPSULE | Freq: Every day | ORAL | Status: DC | PRN

## 2021-01-07 MED ORDER — SODIUM CHLORIDE 0.9 % IV BOLUS
500.0000 mL | Freq: Once | INTRAVENOUS | Status: AC
  Administered 2021-01-07: 500 mL via INTRAVENOUS

## 2021-01-07 NOTE — Progress Notes (Signed)
Patient had a syncopal episode while on the [facility].  He c/o of weakness, had some tremors and fell forward, NA was in the room and was able to catch him and set him back on the [facility].  Patients blood pressure was 89/50.  Patient has had several dark BM's today.  MD notified.

## 2021-01-07 NOTE — Progress Notes (Addendum)
PROGRESS NOTE  [name] RCV:[id number] DOB: [birth year] DOA: 01/02/2021 PCP: Denita Lung, MD   LOS: 3 days   Brief narrative: Patient is a 70 year old African-American male with past medical history significant for Parkinson disease, BPH, HTN, HLD and GI bleed presented to the hospital with intermittent bleeding.  EGD was done which did not show any evidence of bleeding.  Patient underwent CTA of the abdomen pelvis that localized bleeding to the colon area.  IR was consulted but embolization was not successful.  Patient also had some chest discomfort on 01/05/2021.  Cardiology was consulted.  Patient received PRBC transfusion.     Assessment/Plan:  Active Problems:   Acute GI bleeding   GI bleed   GI bleed likely diverticular: CT was unremarkable.  CTA showed localized bleeding to the ascending colon but embolization was not successful as there was no significant bleeding during the procedure.  Continue CBC monitoring.  GI on board.  Patient had received blood transfusion .  Hemodynamically stable at this time.  Continue PPI for now.  Acute blood loss anemia: Likely secondary to diverticular bleed.  Hemoglobin has remained stable at 7.7 >7.8 at this time.  GI on board.  Will transfuse if necessary.  We will continue to monitor CBC.     Hypertension/hypotension: Needed to have volume resuscitation for hypotension.   Off Avapro.  Little orthostatic today.  We will give a 500 mL of normal saline bolus.  Mild elevated troponins.  Cardiology on board.  Could be secondary to demand ischemia from anemia.  Continue to transfuse.  Unable to use antiplatelets at this time due to ongoing GI bleed.  Continue beta-blockers or nitrates.  2D echocardiogram with preserved LV function with grade 2 diastolic dysfunction.  No regional wall motion abnormalities were noted.  Continue statins.  Parkinson disease Continue Sinemet.  We will get physical therapy evaluation, pending.  BPH on alfuzosin    DVT prophylaxis: SCDs Start: 01/02/21 1643   Code Status: Full code  Family Communication: Spoke with the patient's wife and updated her about the clinical condition of the patient.  Status is: Inpatient  Remains inpatient appropriate because:Ongoing diagnostic testing needed not appropriate for outpatient work up, Unsafe d/c plan, IV treatments appropriate due to intensity of illness or inability to take PO, and Inpatient level of care appropriate due to severity of illness  Dispo: The patient is from: Home              Anticipated d/c is to: Home, will watch for 1 more day due to orthostatic hypotension.              Patient currently is not medically stable to d/c.   Difficult to place patient No   Consultants: Gastroenterology. Interventional radiology. Cardiology  Procedures: EGD Angiogram attempted  Anti-infectives:  None  Anti-infectives (From admission, onward)    None       Subjective: Today, patient was seen and examined at bedside.  Mild chest discomfort but no dizziness shortness of breath.  Had slightly darker stools but no bright red blood.  Objective: Vitals:   01/07/21 0905 01/07/21 0907  BP: 120/76 93/61  Pulse: 70 (!) 43  Resp: 12 20  Temp:    SpO2: 100% 90%    Intake/Output Summary (Last 24 hours) at 01/07/2021 1149 Last data filed at 01/07/2021 0936 Gross per 24 hour  Intake 720 ml  Output 1525 ml  Net -805 ml    Filed Weights   01/02/21  1213 01/02/21 2017  Weight: 72.6 kg 69.6 kg   Body mass index is 22.02 kg/m.   Physical Exam:  GENERAL: Patient is alert awake and oriented. Not in obvious distress. HENT: Mild pallor noted.  Pupils equally reactive to light. Oral mucosa is moist NECK: is supple, no gross swelling noted. CHEST: Clear to auscultation. No crackles or wheezes.  Diminished breath sounds bilaterally. CVS: S1 and S2 heard, no murmur. Regular rate and rhythm.  ABDOMEN: Soft, non-tender, bowel sounds are  present. EXTREMITIES: No edema. CNS: Cranial nerves are intact. No focal motor deficits.  Tremors of the extremities noted.   SKIN: warm and dry without rashes.  Data Review: I have personally reviewed the following laboratory data and studies,  CBC: Recent Labs  Lab 01/04/21 0831 01/04/21 1523 01/05/21 0323 01/05/21 1140 01/05/21 1821 01/06/21 0257 01/06/21 1657 01/07/21 0245  WBC 9.2   < > 9.1  --  9.0 9.0 9.1 10.5  NEUTROABS 6.1  --   --   --   --   --   --   --   HGB 7.9*   < > 6.5* 7.6* 7.7* 7.7* 8.0* 7.8*  HCT 24.0*   < > 20.3* 23.2* 23.2* 23.2* 24.2* 24.0*  MCV 95.2   < > 98.1  --  91.3 90.6 92.0 92.7  PLT 221   < > 177  --  145* 164 170 164   < > = values in this interval not displayed.    Basic Metabolic Panel: Recent Labs  Lab 01/02/21 1238 01/03/21 0412 01/04/21 0011 01/04/21 0830 01/07/21 0245  NA 133* 135 133* 135 138  K 4.0 4.1 3.9 4.0 4.5  CL 104 104 103 105 109  CO2 24 26 24 23 25   GLUCOSE 95 77 119* 136* 102*  BUN 47* 32* 33* 35* 24*  CREATININE 1.18 0.94 1.06 1.17 0.99  CALCIUM 8.0* 8.5* 8.2* 8.1* 8.1*  MG  --   --   --   --  2.0  PHOS  --   --   --   --  2.9    Liver Function Tests: Recent Labs  Lab 01/02/21 1238  AST 13*  ALT 8  ALKPHOS 37*  BILITOT 0.5  PROT 5.3*  ALBUMIN 3.1*    No results for input(s): LIPASE, AMYLASE in the last 168 hours. No results for input(s): AMMONIA in the last 168 hours. Cardiac Enzymes: No results for input(s): CKTOTAL, CKMB, CKMBINDEX, TROPONINI in the last 168 hours. BNP (last 3 results) No results for input(s): BNP in the last 8760 hours.  ProBNP (last 3 results) No results for input(s): PROBNP in the last 8760 hours.  CBG: No results for input(s): GLUCAP in the last 168 hours. Recent Results (from the past 240 hour(s))  Resp Panel by RT-PCR (Flu A&B, Covid) Nasopharyngeal Swab     Status: None   Collection Time: 01/02/21  3:45 PM   Specimen: Nasopharyngeal Swab; Nasopharyngeal(NP) swabs in  vial transport medium  Result Value Ref Range Status   SARS Coronavirus 2 by RT PCR NEGATIVE NEGATIVE Final    Comment: (NOTE) SARS-CoV-2 target nucleic acids are NOT DETECTED.  The SARS-CoV-2 RNA is generally detectable in upper respiratory specimens during the acute phase of infection. The lowest concentration of SARS-CoV-2 viral copies this assay can detect is 138 copies/mL. A negative result does not preclude SARS-Cov-2 infection and should not be used as the sole basis for treatment or other patient management decisions. A negative result  may occur with  improper specimen collection/handling, submission of specimen other than nasopharyngeal swab, presence of viral mutation(s) within the areas targeted by this assay, and inadequate number of viral copies(<138 copies/mL). A negative result must be combined with clinical observations, patient history, and epidemiological information. The expected result is Negative.  Fact Sheet for Patients:  EntrepreneurPulse.com.au  Fact Sheet for Healthcare Providers:  IncredibleEmployment.be  This test is no t yet approved or cleared by the [place] FDA and  has been authorized for detection and/or diagnosis of SARS-CoV-2 by FDA under an Emergency Use Authorization (EUA). This EUA will remain  in effect (meaning this test can be used) for the duration of the COVID-19 declaration under Section 564(b)(1) of the Act, 21 U.S.C.section 360bbb-3(b)(1), unless the authorization is terminated  or revoked sooner.       Influenza A by PCR NEGATIVE NEGATIVE Final   Influenza B by PCR NEGATIVE NEGATIVE Final    Comment: (NOTE) The Xpert Xpress SARS-CoV-2/FLU/RSV plus assay is intended as an aid in the diagnosis of influenza from Nasopharyngeal swab specimens and should not be used as a sole basis for treatment. Nasal washings and aspirates are unacceptable for Xpert Xpress SARS-CoV-2/FLU/RSV testing.  Fact  Sheet for Patients: EntrepreneurPulse.com.au  Fact Sheet for Healthcare Providers: IncredibleEmployment.be  This test is not yet approved or cleared by the [place] FDA and has been authorized for detection and/or diagnosis of SARS-CoV-2 by FDA under an Emergency Use Authorization (EUA). This EUA will remain in effect (meaning this test can be used) for the duration of the COVID-19 declaration under Section 564(b)(1) of the Act, 21 U.S.C. section 360bbb-3(b)(1), unless the authorization is terminated or revoked.  Performed at [facility], [place] [address] [place], [place] [address]   MRSA PCR Screening     Status: None   Collection Time: 01/04/21 11:52 AM   Specimen: Nasal Mucosa; Nasopharyngeal  Result Value Ref Range Status   MRSA by PCR NEGATIVE NEGATIVE Final    Comment:        The GeneXpert MRSA Assay (FDA approved for NASAL specimens only), is one component of a comprehensive MRSA colonization surveillance program. It is not intended to diagnose MRSA infection nor to guide or monitor treatment for MRSA infections. Performed at [facility], [place] [address], [place], [place] [address]       Studies: ECHOCARDIOGRAM COMPLETE  Result Date: 01/07/2021    ECHOCARDIOGRAM REPORT   Patient Name:   DIANDRE MERICA Date of Exam: 01/07/2021 Medical Rec #:  [id number]       Height:       70.0 in Accession #:    [id number]      Weight:       153.4 lb Date of Birth:  15-Apr-1951       BSA:          1.865 m Patient Age:    28 years        BP:           109/60 mmHg Patient Gender: M               HR:           75 bpm. Exam Location:  Inpatient Procedure: 2D Echo, Cardiac Doppler and Color Doppler Indications:    Chest pain, elevated troponin  History:        Patient has prior history of Echocardiogram examinations, most  recent 01/20/2012. Signs/Symptoms:Chest Pain; Risk                  Factors:Dyslipidemia and Hypertension. 01/20/2012 stress echo.  Sonographer:    Luisa Hart RDCS Referring Phys: [place]  1. Left ventricular ejection fraction, by estimation, is 60 to 65%. The left ventricle has normal function. The left ventricle has no regional wall motion abnormalities. Left ventricular diastolic parameters are consistent with Grade II diastolic dysfunction (pseudonormalization).  2. Right ventricular systolic function is normal. The right ventricular size is mildly enlarged. Tricuspid regurgitation signal is inadequate for assessing PA pressure.  3. Left atrial size was moderately dilated.  4. The mitral valve is normal in structure. No evidence of mitral valve regurgitation. No evidence of mitral stenosis.  5. The aortic valve is tricuspid. Aortic valve regurgitation is trivial. Mild to moderate aortic valve sclerosis/calcification is present, without any evidence of aortic stenosis.  6. The inferior vena cava is normal in size with greater than 50% respiratory variability, suggesting right atrial pressure of 3 mmHg. FINDINGS  Left Ventricle: Left ventricular ejection fraction, by estimation, is 60 to 65%. The left ventricle has normal function. The left ventricle has no regional wall motion abnormalities. The left ventricular internal cavity size was normal in size. There is  no left ventricular hypertrophy. Left ventricular diastolic parameters are consistent with Grade II diastolic dysfunction (pseudonormalization). Right Ventricle: The right ventricular size is mildly enlarged.Right ventricular systolic function is normal. Tricuspid regurgitation signal is inadequate for assessing PA pressure. The tricuspid regurgitant velocity is 2.33 m/s, and with an assumed right atrial pressure of 3 mmHg, the estimated right ventricular systolic pressure is 77.9 mmHg. Left Atrium: Left atrial size was moderately dilated. Right Atrium: Right atrial size was normal in size.  Pericardium: Trivial pericardial effusion is present. Mitral Valve: The mitral valve is normal in structure. No evidence of mitral valve regurgitation. No evidence of mitral valve stenosis. Tricuspid Valve: The tricuspid valve is normal in structure. Tricuspid valve regurgitation is mild . No evidence of tricuspid stenosis. Aortic Valve: The aortic valve is tricuspid. Aortic valve regurgitation is trivial. Aortic regurgitation PHT measures 1040 msec. Mild to moderate aortic valve sclerosis/calcification is present, without any evidence of aortic stenosis. Aortic valve mean gradient measures 4.5 mmHg. Aortic valve peak gradient measures 8.0 mmHg. Aortic valve area, by VTI measures 2.71 cm. Pulmonic Valve: The pulmonic valve was normal in structure. Pulmonic valve regurgitation is trivial. No evidence of pulmonic stenosis. Aorta: The aortic root is normal in size and structure. Venous: The inferior vena cava is normal in size with greater than 50% respiratory variability, suggesting right atrial pressure of 3 mmHg. IAS/Shunts: No atrial level shunt detected by color flow Doppler.  LEFT VENTRICLE PLAX 2D LVIDd:         4.70 cm     Diastology LVIDs:         2.70 cm     LV e' medial:    6.09 cm/s LV PW:         1.10 cm     LV E/e' medial:  13.6 LV IVS:        1.00 cm     LV e' lateral:   7.51 cm/s LVOT diam:     2.30 cm     LV E/e' lateral: 11.0 LV SV:         84 LV SV Index:   45 LVOT Area:     4.15 cm  LV  Volumes (MOD) LV vol d, MOD A2C: 48.9 ml LV vol d, MOD A4C: 94.0 ml LV vol s, MOD A2C: 16.9 ml LV vol s, MOD A4C: 27.6 ml LV SV MOD A2C:     32.0 ml LV SV MOD A4C:     94.0 ml LV SV MOD BP:      47.7 ml RIGHT VENTRICLE RV Basal diam:  4.40 cm RV Mid diam:    3.20 cm RV S prime:     16.00 cm/s TAPSE (M-mode): 2.1 cm LEFT ATRIUM              Index       RIGHT ATRIUM           Index LA diam:        3.80 cm  2.04 cm/m  RA Area:     17.60 cm LA Vol (A2C):   141.0 ml 75.60 ml/m RA Volume:   47.20 ml  25.31 ml/m LA Vol  (A4C):   65.4 ml  35.07 ml/m LA Biplane Vol: 105.0 ml 56.30 ml/m  AORTIC VALVE                   PULMONIC VALVE AV Area (Vmax):    2.61 cm    PV Vmax:       0.84 m/s AV Area (Vmean):   2.48 cm    PV Vmean:      67.800 cm/s AV Area (VTI):     2.71 cm    PV VTI:        0.222 m AV Vmax:           141.00 cm/s PV Peak grad:  2.8 mmHg AV Vmean:          96.000 cm/s PV Mean grad:  2.0 mmHg AV VTI:            0.312 m AV Peak Grad:      8.0 mmHg AV Mean Grad:      4.5 mmHg LVOT Vmax:         88.60 cm/s LVOT Vmean:        57.400 cm/s LVOT VTI:          0.203 m LVOT/AV VTI ratio: 0.65 AI PHT:            1040 msec  AORTA Ao Root diam: 3.60 cm Ao Asc diam:  3.10 cm MITRAL VALVE               TRICUSPID VALVE MV Area (PHT): 2.63 cm    TR Peak grad:   21.7 mmHg MV Decel Time: 288 msec    TR Vmax:        233.00 cm/s MV E velocity: 82.60 cm/s MV A velocity: 69.50 cm/s  SHUNTS MV E/A ratio:  1.19        Systemic VTI:  0.20 m                            Systemic Diam: 2.30 cm Kirk Ruths MD Electronically signed by Kirk Ruths MD Signature Date/Time: 01/07/2021/11:42:42 AM    Final       Flora Lipps, MD  [facility] 01/07/2021  If 7PM-7AM, please contact night-coverage

## 2021-01-07 NOTE — Progress Notes (Signed)
Progress Note  Patient Name: [name] Date of Encounter: 01/07/2021  [facility] Cardiologist: Glenetta Hew, MD   Subjective   Feeling better, occasional sensation of flutter-like sensation in his left chest.  He did have a bowel movement, black stool.  No bright red blood    Inpatient Medications    Scheduled Meds:  sodium chloride   Intravenous Once   alfuzosin  10 mg Oral Daily   atorvastatin  40 mg Oral QPM   carbidopa-levodopa  1.5 tablet Oral TID   Chlorhexidine Gluconate Cloth  6 each Topical Daily   citalopram  20 mg Oral Daily   docusate sodium  200 mg Oral Daily   isosorbide mononitrate  30 mg Oral Daily   mouth rinse  15 mL Mouth Rinse BID   metoprolol tartrate  50 mg Oral BID    morphine injection  2 mg Intravenous Once   multivitamin with minerals  1 tablet Oral Daily   pantoprazole (PROTONIX) IV  40 mg Intravenous Q12H   polyethylene glycol  17 g Oral Daily   timolol  1 drop Both Eyes Daily   trihexyphenidyl  1 mg Oral BID WC   verapamil  240 mg Oral Daily   Continuous Infusions:  PRN Meds: acetaminophen, nitroGLYCERIN, sodium chloride   Vital Signs    Vitals:   01/07/21 0903 01/07/21 0905 01/07/21 0907 01/07/21 1200  BP: 121/83 120/76 93/61 109/64  Pulse: 72 70 (!) 43 (!) 58  Resp: 20 12 20 13   Temp:    98.2 F (36.8 C)  TempSrc:    Oral  SpO2: 100% 100% 90% 99%  Weight:      Height:        Intake/Output Summary (Last 24 hours) at 01/07/2021 1442 Last data filed at 01/07/2021 1233 Gross per 24 hour  Intake 960 ml  Output 1375 ml  Net -415 ml   Last 3 Weights 01/02/2021 01/02/2021 12/24/2020  Weight (lbs) 153 lb 7 oz 160 lb 157 lb  Weight (kg) 69.6 kg 72.576 kg 71.215 kg      Telemetry    No adverse arrhythmias- Personally Reviewed  ECG    Sinus rhythm no ST elevation, mild T wave inversions noted.- Personally Reviewed  Physical Exam   GEN: No acute distress.   Neck: No JVD Cardiac: RRR, no murmurs, rubs, or gallops.   Respiratory: Clear to auscultation bilaterally. GI: Soft, nontender, non-distended  MS: No edema; No deformity. Neuro:  Nonfocal  Psych: Normal affect   Labs    High Sensitivity Troponin:   Recent Labs  Lab 01/04/21 1523 01/04/21 1807 01/04/21 1930 01/05/21 0323 01/05/21 0735  TROPONINIHS 20* 34* 46* 93* 110*      Chemistry Recent Labs  Lab 01/02/21 1238 01/03/21 0412 01/04/21 0011 01/04/21 0830 01/07/21 0245  NA 133*   < > 133* 135 138  K 4.0   < > 3.9 4.0 4.5  CL 104   < > 103 105 109  CO2 24   < > 24 23 25   GLUCOSE 95   < > 119* 136* 102*  BUN 47*   < > 33* 35* 24*  CREATININE 1.18   < > 1.06 1.17 0.99  CALCIUM 8.0*   < > 8.2* 8.1* 8.1*  PROT 5.3*  --   --   --   --   ALBUMIN 3.1*  --   --   --   --   AST 13*  --   --   --   --  ALT 8  --   --   --   --   ALKPHOS 37*  --   --   --   --   BILITOT 0.5  --   --   --   --   GFRNONAA >60   < > >60 >60 >60  ANIONGAP 5   < > 6 7 4*   < > = values in this interval not displayed.     Hematology Recent Labs  Lab 01/06/21 0257 01/06/21 1657 01/07/21 0245  WBC 9.0 9.1 10.5  RBC 2.56* 2.63* 2.59*  HGB 7.7* 8.0* 7.8*  HCT 23.2* 24.2* 24.0*  MCV 90.6 92.0 92.7  MCH 30.1 30.4 30.1  MCHC 33.2 33.1 32.5  RDW 15.5 15.3 15.0  PLT 164 170 164    BNPNo results for input(s): BNP, PROBNP in the last 168 hours.   DDimer No results for input(s): DDIMER in the last 168 hours.   Radiology    ECHOCARDIOGRAM COMPLETE  Result Date: 01/07/2021    ECHOCARDIOGRAM REPORT   Patient Name:   [name] Date of Exam: 01/07/2021 Medical Rec #:  [id number]       Height:       70.0 in Accession #:    [id number]      Weight:       153.4 lb Date of Birth:  11-Dec-1950       BSA:          1.865 m Patient Age:    70 years        BP:           109/60 mmHg Patient Gender: M               HR:           75 bpm. Exam Location:  Inpatient Procedure: 2D Echo, Cardiac Doppler and Color Doppler Indications:    Chest pain, elevated troponin   History:        Patient has prior history of Echocardiogram examinations, most                 recent 01/20/2012. Signs/Symptoms:Chest Pain; Risk                 Factors:Dyslipidemia and Hypertension. 01/20/2012 stress echo.  Sonographer:    Luisa Hart RDCS Referring Phys: [place]  1. Left ventricular ejection fraction, by estimation, is 60 to 65%. The left ventricle has normal function. The left ventricle has no regional wall motion abnormalities. Left ventricular diastolic parameters are consistent with Grade II diastolic dysfunction (pseudonormalization).  2. Right ventricular systolic function is normal. The right ventricular size is mildly enlarged. Tricuspid regurgitation signal is inadequate for assessing PA pressure.  3. Left atrial size was moderately dilated.  4. The mitral valve is normal in structure. No evidence of mitral valve regurgitation. No evidence of mitral stenosis.  5. The aortic valve is tricuspid. Aortic valve regurgitation is trivial. Mild to moderate aortic valve sclerosis/calcification is present, without any evidence of aortic stenosis.  6. The inferior vena cava is normal in size with greater than 50% respiratory variability, suggesting right atrial pressure of 3 mmHg. FINDINGS  Left Ventricle: Left ventricular ejection fraction, by estimation, is 60 to 65%. The left ventricle has normal function. The left ventricle has no regional wall motion abnormalities. The left ventricular internal cavity size was normal in size. There is  no left ventricular hypertrophy. Left ventricular diastolic parameters are consistent  with Grade II diastolic dysfunction (pseudonormalization). Right Ventricle: The right ventricular size is mildly enlarged.Right ventricular systolic function is normal. Tricuspid regurgitation signal is inadequate for assessing PA pressure. The tricuspid regurgitant velocity is 2.33 m/s, and with an assumed right atrial pressure of 3 mmHg, the estimated  right ventricular systolic pressure is 66.4 mmHg. Left Atrium: Left atrial size was moderately dilated. Right Atrium: Right atrial size was normal in size. Pericardium: Trivial pericardial effusion is present. Mitral Valve: The mitral valve is normal in structure. No evidence of mitral valve regurgitation. No evidence of mitral valve stenosis. Tricuspid Valve: The tricuspid valve is normal in structure. Tricuspid valve regurgitation is mild . No evidence of tricuspid stenosis. Aortic Valve: The aortic valve is tricuspid. Aortic valve regurgitation is trivial. Aortic regurgitation PHT measures 1040 msec. Mild to moderate aortic valve sclerosis/calcification is present, without any evidence of aortic stenosis. Aortic valve mean gradient measures 4.5 mmHg. Aortic valve peak gradient measures 8.0 mmHg. Aortic valve area, by VTI measures 2.71 cm. Pulmonic Valve: The pulmonic valve was normal in structure. Pulmonic valve regurgitation is trivial. No evidence of pulmonic stenosis. Aorta: The aortic root is normal in size and structure. Venous: The inferior vena cava is normal in size with greater than 50% respiratory variability, suggesting right atrial pressure of 3 mmHg. IAS/Shunts: No atrial level shunt detected by color flow Doppler.  LEFT VENTRICLE PLAX 2D LVIDd:         4.70 cm     Diastology LVIDs:         2.70 cm     LV e' medial:    6.09 cm/s LV PW:         1.10 cm     LV E/e' medial:  13.6 LV IVS:        1.00 cm     LV e' lateral:   7.51 cm/s LVOT diam:     2.30 cm     LV E/e' lateral: 11.0 LV SV:         84 LV SV Index:   45 LVOT Area:     4.15 cm  LV Volumes (MOD) LV vol d, MOD A2C: 48.9 ml LV vol d, MOD A4C: 94.0 ml LV vol s, MOD A2C: 16.9 ml LV vol s, MOD A4C: 27.6 ml LV SV MOD A2C:     32.0 ml LV SV MOD A4C:     94.0 ml LV SV MOD BP:      47.7 ml RIGHT VENTRICLE RV Basal diam:  4.40 cm RV Mid diam:    3.20 cm RV S prime:     16.00 cm/s TAPSE (M-mode): 2.1 cm LEFT ATRIUM              Index       RIGHT  ATRIUM           Index LA diam:        3.80 cm  2.04 cm/m  RA Area:     17.60 cm LA Vol (A2C):   141.0 ml 75.60 ml/m RA Volume:   47.20 ml  25.31 ml/m LA Vol (A4C):   65.4 ml  35.07 ml/m LA Biplane Vol: 105.0 ml 56.30 ml/m  AORTIC VALVE                   PULMONIC VALVE AV Area (Vmax):    2.61 cm    PV Vmax:       0.84 m/s AV Area (Vmean):   2.48 cm  PV Vmean:      67.800 cm/s AV Area (VTI):     2.71 cm    PV VTI:        0.222 m AV Vmax:           141.00 cm/s PV Peak grad:  2.8 mmHg AV Vmean:          96.000 cm/s PV Mean grad:  2.0 mmHg AV VTI:            0.312 m AV Peak Grad:      8.0 mmHg AV Mean Grad:      4.5 mmHg LVOT Vmax:         88.60 cm/s LVOT Vmean:        57.400 cm/s LVOT VTI:          0.203 m LVOT/AV VTI ratio: 0.65 AI PHT:            1040 msec  AORTA Ao Root diam: 3.60 cm Ao Asc diam:  3.10 cm MITRAL VALVE               TRICUSPID VALVE MV Area (PHT): 2.63 cm    TR Peak grad:   21.7 mmHg MV Decel Time: 288 msec    TR Vmax:        233.00 cm/s MV E velocity: 82.60 cm/s MV A velocity: 69.50 cm/s  SHUNTS MV E/A ratio:  1.19        Systemic VTI:  0.20 m                            Systemic Diam: 2.30 cm Kirk Ruths MD Electronically signed by Kirk Ruths MD Signature Date/Time: 01/07/2021/11:42:42 AM    Final     Cardiac Studies     1. Left ventricular ejection fraction, by estimation, is 60 to 65%. The  left ventricle has normal function. The left ventricle has no regional  wall motion abnormalities. Left ventricular diastolic parameters are  consistent with Grade II diastolic  dysfunction (pseudonormalization).   2. Right ventricular systolic function is normal. The right ventricular  size is mildly enlarged. Tricuspid regurgitation signal is inadequate for  assessing PA pressure.   3. Left atrial size was moderately dilated.   4. The mitral valve is normal in structure. No evidence of mitral valve  regurgitation. No evidence of mitral stenosis.   5. The aortic valve is  tricuspid. Aortic valve regurgitation is trivial.  Mild to moderate aortic valve sclerosis/calcification is present, without  any evidence of aortic stenosis.   6. The inferior vena cava is normal in size with greater than 50%  respiratory variability, suggesting right atrial pressure of 3 mmHg.   Patient Profile     70 y.o. male  with acute GI bleed with concomitant anemia requiring blood products/resuscitation.  Troponins increased from 20 up to 110.  Assessment & Plan    Previous transient ST elevation noted on ECG - This was not a STEMI but this does represent likely vasospasm.  Troponin elevation was also minimal.  Ejection fraction overall reassuring.  Continue with current medication management which includes metoprolol as well as verapamil.  We also have him on isosorbide as well as long-acting nitrate.  GI bleed - He did have some black stools today.  No bright red blood.  Primary team is watching his hemoglobin closely.  Avoiding aspirin Plavix or heparin.  Transfuse as necessary.  Mildly dilated thoracic aorta -  Previously 4.2 cm on CT.  Aortic root appears normal on echocardiogram today.  Normal pump function.  Reassuring.  No further new cardiac recommendations at this time.  We will go ahead and sign off.  Please let us know if we can be of further assistance.    For questions or updates, please contact [facility] Please consult [email or web address] for contact info under        Signed, Candee Furbish, MD  01/07/2021, 2:42 PM

## 2021-01-07 NOTE — Progress Notes (Addendum)
[name] Gastroenterology Progress Note  [name] 70 y.o. [birth year]  CC: Rectal bleeding  Subjective: Patient reports feeling better today. He is oriented. He denies abdominal pain.  Has not had a BM today. Last BM yesterday was black.  Has some dizziness upon standing. No chest pain or shortness of breath currently, but endorses intermittent chest pain.  ROS :Review of Systems  Gastrointestinal:  Negative for abdominal pain, blood in stool, constipation, diarrhea, heartburn, melena, nausea and vomiting.  Neurological:  Positive for dizziness. Negative for loss of consciousness.    Objective: Vital signs in last 24 hours: Vitals:   01/06/21 2200 01/07/21 0800  BP: 109/60   Pulse: 62   Resp: 17   Temp:  98.3 F (36.8 C)  SpO2: 100%     Physical Exam:  General:  Alert, oriented, cooperative, no acute distress  Head:  Normocephalic, without obvious abnormality, atraumatic  Eyes:  Conjunctival pallor, EOMs intact  Lungs:   Clear to auscultation bilaterally, respirations unlabored  Heart:  Regular rate and rhythm, S1, S2 normal  Abdomen:   Soft, non-tender, non-distended, bowel sounds active all four quadrants  Extremities: Extremities normal, atraumatic, no  edema    Lab Results: Recent Labs    01/07/21 0245  NA 138  K 4.5  CL 109  CO2 25  GLUCOSE 102*  BUN 24*  CREATININE 0.99  CALCIUM 8.1*  MG 2.0  PHOS 2.9    No results for input(s): AST, ALT, ALKPHOS, BILITOT, PROT, ALBUMIN in the last 72 hours. Recent Labs    01/06/21 1657 01/07/21 0245  WBC 9.1 10.5  HGB 8.0* 7.8*  HCT 24.2* 24.0*  MCV 92.0 92.7  PLT 170 164    No results for input(s): LABPROT, INR in the last 72 hours.   Assessment: Rectal bleeding, most likely diverticular bleeding.  Unable to undergo IR embolization as no bleeding site was identified.  Currently quiescent. -Hgb 7.8, stable (had 2u pRBCs 6/12 with post-transfusion Hgb 7.6)  Delirium, resolving  Plan: Continue  supportive care. Continue to monitor H&H with transfusion as needed to maintain Hgb >7.  If recurrent destabilizing bleeding, proceed with CT-A, followed by IR consultation if positive.  [facility] will follow.  [name] PA-C 01/07/2021, 8:45 AM  Contact #  [phone]

## 2021-01-07 NOTE — Progress Notes (Signed)
PT Cancellation Note  Patient Details Name: [name] MRN: [id number] DOB: [birth year]   Cancelled Treatment:    Reason Eval/Treat Not Completed: Medical issues which prohibited therapy--syncopal event with nursing. Will hold PT today and check back as schedule allows.     [facility]  Office: [phone] Pager: [phone]

## 2021-01-08 DIAGNOSIS — D62 Acute posthemorrhagic anemia: Secondary | ICD-10-CM | POA: Diagnosis not present

## 2021-01-08 DIAGNOSIS — R778 Other specified abnormalities of plasma proteins: Secondary | ICD-10-CM | POA: Diagnosis not present

## 2021-01-08 DIAGNOSIS — K922 Gastrointestinal hemorrhage, unspecified: Secondary | ICD-10-CM | POA: Diagnosis not present

## 2021-01-08 DIAGNOSIS — I10 Essential (primary) hypertension: Secondary | ICD-10-CM | POA: Diagnosis not present

## 2021-01-08 LAB — HEMOGLOBIN AND HEMATOCRIT, BLOOD
HCT: 22 % — ABNORMAL LOW (ref 39.0–52.0)
HCT: 32.7 % — ABNORMAL LOW (ref 39.0–52.0)
Hemoglobin: 7.1 g/dL — ABNORMAL LOW (ref 13.0–17.0)
Hemoglobin: 11 g/dL — ABNORMAL LOW (ref 13.0–17.0)

## 2021-01-08 LAB — PREPARE RBC (CROSSMATCH)

## 2021-01-08 MED ORDER — PEG 3350-KCL-NA BICARB-NACL 420 G PO SOLR
4000.0000 mL | Freq: Once | ORAL | Status: AC
  Administered 2021-01-08: 4000 mL via ORAL
  Filled 2021-01-08: qty 4000

## 2021-01-08 MED ORDER — SODIUM CHLORIDE 0.9% IV SOLUTION: Freq: Once | INTRAVENOUS | Status: DC

## 2021-01-08 MED ORDER — SODIUM CHLORIDE 0.9 % IV SOLN: INTRAVENOUS | Status: DC

## 2021-01-08 NOTE — Progress Notes (Signed)
PT Cancellation Note  Patient Details Name: [name] MRN: [id number] DOB: September 27, 1950   Cancelled Treatment:    Reason Eval/Treat Not Completed: Patient at procedure or test/unavailable. Pt is now prepping for colonoscopy. Will check back on tomorrow.    [facility]  Office: [phone] Pager: [phone]

## 2021-01-08 NOTE — H&P (View-Only) (Signed)
[facility] Gastroenterology Progress Note  [name] 70 y.o. October 11, 1950  CC: Rectal bleeding  Subjective: Patient denies abdominal pain, nausea, vomiting. Had several small black Bms yesterday.  Reports dizziness and shakiness upon standing.   ROS :Review of Systems  Gastrointestinal:  Positive for melena. Negative for abdominal pain, blood in stool, constipation, diarrhea, heartburn, nausea and vomiting.  Neurological:  Positive for dizziness. Negative for loss of consciousness.    Objective: Vital signs in last 24 hours: Vitals:   01/08/21 0400 01/08/21 0800  BP: 99/63   Pulse: 96   Resp: 18   Temp: (!) 96.8 F (36 C) 97.8 F (36.6 C)  SpO2: 100%     Physical Exam:  General:  Alert, oriented, cooperative, no acute distress  Head:  Normocephalic, without obvious abnormality, atraumatic  Eyes:  Conjunctival pallor, EOMs intact  Lungs:   Clear to auscultation bilaterally, respirations unlabored  Heart:  Regular rate and rhythm, S1, S2 normal  Abdomen:   Soft, non-tender, non-distended, bowel sounds active all four quadrants  Extremities: Extremities normal, atraumatic, no  edema    Lab Results: Recent Labs    01/07/21 0245  NA 138  K 4.5  CL 109  CO2 25  GLUCOSE 102*  BUN 24*  CREATININE 0.99  CALCIUM 8.1*  MG 2.0  PHOS 2.9    No results for input(s): AST, ALT, ALKPHOS, BILITOT, PROT, ALBUMIN in the last 72 hours. Recent Labs    01/06/21 1657 01/07/21 0245 01/07/21 1538 01/08/21 0247  WBC 9.1 10.5  --   --   HGB 8.0* 7.8* 7.4* 7.1*  HCT 24.2* 24.0* 22.5* 22.0*  MCV 92.0 92.7  --   --   PLT 170 164  --   --     No results for input(s): LABPROT, INR in the last 72 hours.   Assessment: Rectal bleeding, most likely diverticular bleeding.  Unable to undergo IR embolization as no bleeding site was identified.  Currently quiescent. -Hgb 7.1, decreased from 7.8 yesterday  Delirium, resolving  Plan: Clear liquid diet, NuLYTELY prep today, n.p.o. at  midnight. Colonoscopy tomorrow.  I thoroughly discussed the procedure with the patient to include nature, alternatives, benefits, and risks (including but not limited to bleeding, infection, perforation, anesthesia/cardiac and pulmonary complications).  Patient verbalized understanding and gave verbal consent to proceed with colonoscopy.  Continue supportive care. Continue to monitor H&H with transfusion as needed to maintain Hgb >7.  If recurrent destabilizing bleeding, proceed with CT-A, followed by IR consultation if positive.  [facility] will follow.  [name] PA-C 01/08/2021, 11:32 AM  Contact #  [phone]

## 2021-01-08 NOTE — Anesthesia Preprocedure Evaluation (Addendum)
Anesthesia Evaluation  Patient identified by MRN, date of birth, ID band Patient awake    Reviewed: Allergy & Precautions, NPO status , Patient's Chart, lab work & pertinent test results  Airway Mallampati: II  TM Distance: >3 FB Neck ROM: Full    Dental no notable dental hx. (+) Teeth Intact, Dental Advisory Given   Pulmonary former smoker,    Pulmonary exam normal breath sounds clear to auscultation       Cardiovascular hypertension, Pt. on medications + Peripheral Vascular Disease  Normal cardiovascular exam Rhythm:Regular Rate:Normal  01/07/21 ECHO - elevated troponins and chest pain 1. Left ventricular ejection fraction, by estimation, is 60 to 65%. The  left ventricle has normal function. The left ventricle has no regional  wall motion abnormalities. Left ventricular diastolic parameters are  consistent with Grade II diastolic  dysfunction (pseudonormalization).  2. Right ventricular systolic function is normal. The right ventricular  size is mildly enlarged. Tricuspid regurgitation signal is inadequate for  assessing PA pressure.  3. Left atrial size was moderately dilated.  4. The mitral valve is normal in structure. No evidence of mitral valve  regurgitation. No evidence of mitral stenosis.  5. The aortic valve is tricuspid. Aortic valve regurgitation is trivial.  Mild to moderate aortic valve sclerosis/calcification is present, without  any evidence of aortic stenosis.  6. The inferior vena cava is normal in size with greater than 50%  respiratory variability, suggesting right atrial pressure of 3 mmHg.     Neuro/Psych PSYCHIATRIC DISORDERS Depression Parkinsons DZ    GI/Hepatic GERD  Medicated,(+) Hepatitis -, C  Endo/Other    Renal/GU Lab Results      Component                Value               Date                      CREATININE               0.99                01/07/2021                BUN                       24 (H)              01/07/2021                NA                       138                 01/07/2021                K                        4.5                 01/07/2021                CL                       109                 01/07/2021  CO2                      25                  01/07/2021                Musculoskeletal   Abdominal   Peds  Hematology  (+) anemia , Lab Results      Component                Value               Date                      WBC                      10.5                01/07/2021                HGB                      7.1 (L)             01/08/2021                HCT                      22.0 (L)            01/08/2021                MCV                      92.7                01/07/2021                PLT                      164                 01/07/2021              Anesthesia Other Findings   Reproductive/Obstetrics                            Anesthesia Physical Anesthesia Plan  ASA: 3  Anesthesia Plan: MAC   Post-op Pain Management:    Induction:   PONV Risk Score and Plan: Treatment may vary due to age or medical condition  Airway Management Planned: Natural Airway  Additional Equipment: None  Intra-op Plan:   Post-operative Plan:   Informed Consent: I have reviewed the patients History and Physical, chart, labs and discussed the procedure including the risks, benefits and alternatives for the proposed anesthesia with the patient or authorized representative who has indicated his/her understanding and acceptance.     Dental advisory given  Plan Discussed with:   Anesthesia Plan Comments: (Rectal Bleeding Colonoscopy)       Anesthesia Quick Evaluation

## 2021-01-08 NOTE — Progress Notes (Signed)
OT Cancellation Note  Patient Details Name: [name] MRN: [id number] DOB: [birth year]   Cancelled Treatment:    Reason Eval/Treat Not Completed: Medical issues which prohibited therapy. RN report patient to get blood this AM and positive orthostatics this morning during her assessment, will check back after blood transfusion as able.  Delbert Phenix OT OT pager: [phone]   Rosemary Holms 01/08/2021, 9:44 AM

## 2021-01-08 NOTE — Progress Notes (Signed)
PROGRESS NOTE  [name] YTK:[id number] DOB: 1951/03/12 DOA: 01/02/2021 PCP: Denita Lung, MD   LOS: 4 days   Brief narrative:  Patient is a 70 year old African-American male with past medical history significant for Parkinson disease, BPH, HTN, HLD and GI bleed presented to the hospital with intermittent bleeding.  EGD was done which did not show any evidence of bleeding.  Patient underwent CTA of the abdomen pelvis that localized bleeding to the colon area.  IR was consulted but embolization was not successful.  Patient also had some chest discomfort on 01/05/2021.  Cardiology was consulted.  Patient received PRBC transfusion.  Patient was then admitted to the hospital for further evaluation and treatment.  Assessment/Plan:  Active Problems:   Acute GI bleeding   GI bleed   GI bleed likely diverticular: CT was unremarkable.  CTA showed localized bleeding to the ascending colon but embolization was not successful as there was no significant bleeding during the procedure.  Continue CBC monitoring.  GI on board.  Will transfuse 2 units of packed RBC today.  GI has seen the patient.  Patient will likely need colonoscopy evaluation again tomorrow since angiogram was revealing.    Acute blood loss anemia: Likely secondary to diverticular bleed.  Continues to have dark melena.  Hemoglobin has trended down.  Will transfuse units of packed RBC due to chest discomfort as well.  GI on board.    Hypotension. Needed to have volume resuscitation for hypotension.   Off Avapro.  Patient is still orthostatic but could be secondary to volume depletion plus use of antihypertensive.  Will hold  medications for low blood pressure.  Likely ongoing bleeding.  We will continue to transfuse RBC.[name]  GI on board.  Will likely need colonoscopic evaluation in a.m.    Mild elevated troponins.  Cardiology on board.  Could be secondary to demand ischemia from anemia.  2D echocardiogram with preserved LV function  with grade 2 diastolic dysfunction.  No regional wall motion abnormalities were noted.  Continue statins.  Will transfuse 2 units of packed RBC today.  Parkinson disease Continue Sinemet.  Physical therapy pending  BPH on alfuzosin   DVT prophylaxis: SCDs Start: 01/02/21 1643   Code Status: Full code  Family Communication: Spoke with the patient's wife and updated her about the clinical condition of the patient.  Status is: Inpatient  Remains inpatient appropriate because:Ongoing diagnostic testing needed not appropriate for outpatient work up, Unsafe d/c plan, IV treatments appropriate due to intensity of illness or inability to take PO, and Inpatient level of care appropriate due to severity of illness will need for colonoscopic evaluation  Dispo: The patient is from: Home              Anticipated d/c is to: Home, will continue to resuscitate and monitor hemoglobin, likely need colonoscopy in a.m.              patient currently is not medically stable to d/c.   Difficult to place patient No   Consultants: Gastroenterology. Interventional radiology. Cardiology  Procedures: EGD Angiogram attempted PRBC transfusion.  Anti-infectives:  None  Anti-infectives (From admission, onward)    None       Subjective: Today, patient was seen and examined at bedside.  Complains of mild chest discomfort.  Staff reported that patient was very orthostatic.  Continues to have darker stools.  Objective: Vitals:   01/08/21 1304 01/08/21 1320  BP: 114/64 (!) 146/52  Pulse: 66 68  Resp: 18  14  Temp: 98 F (36.7 C) 98.2 F (36.8 C)  SpO2: 100% 100%    Intake/Output Summary (Last 24 hours) at 01/08/2021 1327 Last data filed at 01/08/2021 1250 Gross per 24 hour  Intake 1700.15 ml  Output 1865 ml  Net -164.85 ml    Filed Weights   01/02/21 1213 01/02/21 2017  Weight: 72.6 kg 69.6 kg   Body mass index is 22.02 kg/m.   Physical Exam: General:  Average built, not in  obvious distress, alert awake and oriented. HENT:   No scleral pallor or icterus noted. Oral mucosa is moist.  Chest:  Clear breath sounds.  Diminished breath sounds bilaterally. No crackles or wheezes.  CVS: S1 &S2 heard. No murmur.  Regular rate and rhythm. Abdomen: Soft, nontender, nondistended.  Bowel sounds are heard.   Extremities: No cyanosis, clubbing or edema.  Peripheral pulses are palpable. Psych: Alert, awake and oriented, normal mood CNS:  No cranial nerve deficits.  Power equal in all extremities.  Tremors of the extremities Skin: Warm and dry.  No rashes noted.   Data Review: I have personally reviewed the following laboratory data and studies,  CBC: Recent Labs  Lab 01/04/21 0831 01/04/21 1523 01/05/21 0323 01/05/21 1140 01/05/21 1821 01/06/21 0257 01/06/21 1657 01/07/21 0245 01/07/21 1538 01/08/21 0247  WBC 9.2   < > 9.1  --  9.0 9.0 9.1 10.5  --   --   NEUTROABS 6.1  --   --   --   --   --   --   --   --   --   HGB 7.9*   < > 6.5*   < > 7.7* 7.7* 8.0* 7.8* 7.4* 7.1*  HCT 24.0*   < > 20.3*   < > 23.2* 23.2* 24.2* 24.0* 22.5* 22.0*  MCV 95.2   < > 98.1  --  91.3 90.6 92.0 92.7  --   --   PLT 221   < > 177  --  145* 164 170 164  --   --    < > = values in this interval not displayed.    Basic Metabolic Panel: Recent Labs  Lab 01/02/21 1238 01/03/21 0412 01/04/21 0011 01/04/21 0830 01/07/21 0245  NA 133* 135 133* 135 138  K 4.0 4.1 3.9 4.0 4.5  CL 104 104 103 105 109  CO2 24 26 24 23 25   GLUCOSE 95 77 119* 136* 102*  BUN 47* 32* 33* 35* 24*  CREATININE 1.18 0.94 1.06 1.17 0.99  CALCIUM 8.0* 8.5* 8.2* 8.1* 8.1*  MG  --   --   --   --  2.0  PHOS  --   --   --   --  2.9    Liver Function Tests: Recent Labs  Lab 01/02/21 1238  AST 13*  ALT 8  ALKPHOS 37*  BILITOT 0.5  PROT 5.3*  ALBUMIN 3.1*    No results for input(s): LIPASE, AMYLASE in the last 168 hours. No results for input(s): AMMONIA in the last 168 hours. Cardiac Enzymes: No  results for input(s): CKTOTAL, CKMB, CKMBINDEX, TROPONINI in the last 168 hours. BNP (last 3 results) No results for input(s): BNP in the last 8760 hours.  ProBNP (last 3 results) No results for input(s): PROBNP in the last 8760 hours.  CBG: No results for input(s): GLUCAP in the last 168 hours. Recent Results (from the past 240 hour(s))  Resp Panel by RT-PCR (Flu A&B, Covid) Nasopharyngeal Swab  Status: None   Collection Time: 01/02/21  3:45 PM   Specimen: Nasopharyngeal Swab; Nasopharyngeal(NP) swabs in vial transport medium  Result Value Ref Range Status   SARS Coronavirus 2 by RT PCR NEGATIVE NEGATIVE Final    Comment: (NOTE) SARS-CoV-2 target nucleic acids are NOT DETECTED.  The SARS-CoV-2 RNA is generally detectable in upper respiratory specimens during the acute phase of infection. The lowest concentration of SARS-CoV-2 viral copies this assay can detect is 138 copies/mL. A negative result does not preclude SARS-Cov-2 infection and should not be used as the sole basis for treatment or other patient management decisions. A negative result may occur with  improper specimen collection/handling, submission of specimen other than nasopharyngeal swab, presence of viral mutation(s) within the areas targeted by this assay, and inadequate number of viral copies(<138 copies/mL). A negative result must be combined with clinical observations, patient history, and epidemiological information. The expected result is Negative.  Fact Sheet for Patients:  EntrepreneurPulse.com.au  Fact Sheet for Healthcare Providers:  IncredibleEmployment.be  This test is no t yet approved or cleared by the [place] FDA and  has been authorized for detection and/or diagnosis of SARS-CoV-2 by FDA under an Emergency Use Authorization (EUA). This EUA will remain  in effect (meaning this test can be used) for the duration of the COVID-19 declaration under Section  564(b)(1) of the Act, 21 U.S.C.section 360bbb-3(b)(1), unless the authorization is terminated  or revoked sooner.       Influenza A by PCR NEGATIVE NEGATIVE Final   Influenza B by PCR NEGATIVE NEGATIVE Final    Comment: (NOTE) The Xpert Xpress SARS-CoV-2/FLU/RSV plus assay is intended as an aid in the diagnosis of influenza from Nasopharyngeal swab specimens and should not be used as a sole basis for treatment. Nasal washings and aspirates are unacceptable for Xpert Xpress SARS-CoV-2/FLU/RSV testing.  Fact Sheet for Patients: EntrepreneurPulse.com.au  Fact Sheet for Healthcare Providers: IncredibleEmployment.be  This test is not yet approved or cleared by the [place] FDA and has been authorized for detection and/or diagnosis of SARS-CoV-2 by FDA under an Emergency Use Authorization (EUA). This EUA will remain in effect (meaning this test can be used) for the duration of the COVID-19 declaration under Section 564(b)(1) of the Act, 21 U.S.C. section 360bbb-3(b)(1), unless the authorization is terminated or revoked.  Performed at [facility], [place] [address] [place], [place] [address]   MRSA PCR Screening     Status: None   Collection Time: 01/04/21 11:52 AM   Specimen: Nasal Mucosa; Nasopharyngeal  Result Value Ref Range Status   MRSA by PCR NEGATIVE NEGATIVE Final    Comment:        The GeneXpert MRSA Assay (FDA approved for NASAL specimens only), is one component of a comprehensive MRSA colonization surveillance program. It is not intended to diagnose MRSA infection nor to guide or monitor treatment for MRSA infections. Performed at [facility], [place] [address], [place], [place] [address]       Studies: ECHOCARDIOGRAM COMPLETE  Result Date: 01/07/2021    ECHOCARDIOGRAM REPORT   Patient Name:   [name] Date of Exam: 01/07/2021 Medical Rec #:  [id number]       Height:       70.0  in Accession #:    [id number]      Weight:       153.4 lb Date of Birth:  Oct 28, 1950       BSA:  1.865 m Patient Age:    42 years        BP:           109/60 mmHg Patient Gender: M               HR:           75 bpm. Exam Location:  Inpatient Procedure: 2D Echo, Cardiac Doppler and Color Doppler Indications:    Chest pain, elevated troponin  History:        Patient has prior history of Echocardiogram examinations, most                 recent 01/20/2012. Signs/Symptoms:Chest Pain; Risk                 Factors:Dyslipidemia and Hypertension. 01/20/2012 stress echo.  Sonographer:    Luisa Hart RDCS Referring Phys: [place]  1. Left ventricular ejection fraction, by estimation, is 60 to 65%. The left ventricle has normal function. The left ventricle has no regional wall motion abnormalities. Left ventricular diastolic parameters are consistent with Grade II diastolic dysfunction (pseudonormalization).  2. Right ventricular systolic function is normal. The right ventricular size is mildly enlarged. Tricuspid regurgitation signal is inadequate for assessing PA pressure.  3. Left atrial size was moderately dilated.  4. The mitral valve is normal in structure. No evidence of mitral valve regurgitation. No evidence of mitral stenosis.  5. The aortic valve is tricuspid. Aortic valve regurgitation is trivial. Mild to moderate aortic valve sclerosis/calcification is present, without any evidence of aortic stenosis.  6. The inferior vena cava is normal in size with greater than 50% respiratory variability, suggesting right atrial pressure of 3 mmHg. FINDINGS  Left Ventricle: Left ventricular ejection fraction, by estimation, is 60 to 65%. The left ventricle has normal function. The left ventricle has no regional wall motion abnormalities. The left ventricular internal cavity size was normal in size. There is  no left ventricular hypertrophy. Left ventricular diastolic parameters are consistent with  Grade II diastolic dysfunction (pseudonormalization). Right Ventricle: The right ventricular size is mildly enlarged.Right ventricular systolic function is normal. Tricuspid regurgitation signal is inadequate for assessing PA pressure. The tricuspid regurgitant velocity is 2.33 m/s, and with an assumed right atrial pressure of 3 mmHg, the estimated right ventricular systolic pressure is 12.4 mmHg. Left Atrium: Left atrial size was moderately dilated. Right Atrium: Right atrial size was normal in size. Pericardium: Trivial pericardial effusion is present. Mitral Valve: The mitral valve is normal in structure. No evidence of mitral valve regurgitation. No evidence of mitral valve stenosis. Tricuspid Valve: The tricuspid valve is normal in structure. Tricuspid valve regurgitation is mild . No evidence of tricuspid stenosis. Aortic Valve: The aortic valve is tricuspid. Aortic valve regurgitation is trivial. Aortic regurgitation PHT measures 1040 msec. Mild to moderate aortic valve sclerosis/calcification is present, without any evidence of aortic stenosis. Aortic valve mean gradient measures 4.5 mmHg. Aortic valve peak gradient measures 8.0 mmHg. Aortic valve area, by VTI measures 2.71 cm. Pulmonic Valve: The pulmonic valve was normal in structure. Pulmonic valve regurgitation is trivial. No evidence of pulmonic stenosis. Aorta: The aortic root is normal in size and structure. Venous: The inferior vena cava is normal in size with greater than 50% respiratory variability, suggesting right atrial pressure of 3 mmHg. IAS/Shunts: No atrial level shunt detected by color flow Doppler.  LEFT VENTRICLE PLAX 2D LVIDd:         4.70 cm  Diastology LVIDs:         2.70 cm     LV e' medial:    6.09 cm/s LV PW:         1.10 cm     LV E/e' medial:  13.6 LV IVS:        1.00 cm     LV e' lateral:   7.51 cm/s LVOT diam:     2.30 cm     LV E/e' lateral: 11.0 LV SV:         84 LV SV Index:   45 LVOT Area:     4.15 cm  LV Volumes (MOD)  LV vol d, MOD A2C: 48.9 ml LV vol d, MOD A4C: 94.0 ml LV vol s, MOD A2C: 16.9 ml LV vol s, MOD A4C: 27.6 ml LV SV MOD A2C:     32.0 ml LV SV MOD A4C:     94.0 ml LV SV MOD BP:      47.7 ml RIGHT VENTRICLE RV Basal diam:  4.40 cm RV Mid diam:    3.20 cm RV S prime:     16.00 cm/s TAPSE (M-mode): 2.1 cm LEFT ATRIUM              Index       RIGHT ATRIUM           Index LA diam:        3.80 cm  2.04 cm/m  RA Area:     17.60 cm LA Vol (A2C):   141.0 ml 75.60 ml/m RA Volume:   47.20 ml  25.31 ml/m LA Vol (A4C):   65.4 ml  35.07 ml/m LA Biplane Vol: 105.0 ml 56.30 ml/m  AORTIC VALVE                   PULMONIC VALVE AV Area (Vmax):    2.61 cm    PV Vmax:       0.84 m/s AV Area (Vmean):   2.48 cm    PV Vmean:      67.800 cm/s AV Area (VTI):     2.71 cm    PV VTI:        0.222 m AV Vmax:           141.00 cm/s PV Peak grad:  2.8 mmHg AV Vmean:          96.000 cm/s PV Mean grad:  2.0 mmHg AV VTI:            0.312 m AV Peak Grad:      8.0 mmHg AV Mean Grad:      4.5 mmHg LVOT Vmax:         88.60 cm/s LVOT Vmean:        57.400 cm/s LVOT VTI:          0.203 m LVOT/AV VTI ratio: 0.65 AI PHT:            1040 msec  AORTA Ao Root diam: 3.60 cm Ao Asc diam:  3.10 cm MITRAL VALVE               TRICUSPID VALVE MV Area (PHT): 2.63 cm    TR Peak grad:   21.7 mmHg MV Decel Time: 288 msec    TR Vmax:        233.00 cm/s MV E velocity: 82.60 cm/s MV A velocity: 69.50 cm/s  SHUNTS MV E/A ratio:  1.19        Systemic VTI:  0.20 m                            Systemic Diam: 2.30 cm Kirk Ruths MD Electronically signed by Kirk Ruths MD Signature Date/Time: 01/07/2021/11:42:42 AM    Final       Flora Lipps, MD  [facility] 01/08/2021  If 7PM-7AM, please contact night-coverage

## 2021-01-08 NOTE — Progress Notes (Signed)
[facility] Gastroenterology Progress Note  [name] 70 y.o. Dec 08, 1950  CC: Rectal bleeding  Subjective: Patient denies abdominal pain, nausea, vomiting. Had several small black Bms yesterday.  Reports dizziness and shakiness upon standing.   ROS :Review of Systems  Gastrointestinal:  Positive for melena. Negative for abdominal pain, blood in stool, constipation, diarrhea, heartburn, nausea and vomiting.  Neurological:  Positive for dizziness. Negative for loss of consciousness.    Objective: Vital signs in last 24 hours: Vitals:   01/08/21 0400 01/08/21 0800  BP: 99/63   Pulse: 96   Resp: 18   Temp: (!) 96.8 F (36 C) 97.8 F (36.6 C)  SpO2: 100%     Physical Exam:  General:  Alert, oriented, cooperative, no acute distress  Head:  Normocephalic, without obvious abnormality, atraumatic  Eyes:  Conjunctival pallor, EOMs intact  Lungs:   Clear to auscultation bilaterally, respirations unlabored  Heart:  Regular rate and rhythm, S1, S2 normal  Abdomen:   Soft, non-tender, non-distended, bowel sounds active all four quadrants  Extremities: Extremities normal, atraumatic, no  edema    Lab Results: Recent Labs    01/07/21 0245  NA 138  K 4.5  CL 109  CO2 25  GLUCOSE 102*  BUN 24*  CREATININE 0.99  CALCIUM 8.1*  MG 2.0  PHOS 2.9    No results for input(s): AST, ALT, ALKPHOS, BILITOT, PROT, ALBUMIN in the last 72 hours. Recent Labs    01/06/21 1657 01/07/21 0245 01/07/21 1538 01/08/21 0247  WBC 9.1 10.5  --   --   HGB 8.0* 7.8* 7.4* 7.1*  HCT 24.2* 24.0* 22.5* 22.0*  MCV 92.0 92.7  --   --   PLT 170 164  --   --     No results for input(s): LABPROT, INR in the last 72 hours.   Assessment: Rectal bleeding, most likely diverticular bleeding.  Unable to undergo IR embolization as no bleeding site was identified.  Currently quiescent. -Hgb 7.1, decreased from 7.8 yesterday  Delirium, resolving  Plan: Clear liquid diet, NuLYTELY prep today, n.p.o. at  midnight. Colonoscopy tomorrow.  I thoroughly discussed the procedure with the patient to include nature, alternatives, benefits, and risks (including but not limited to bleeding, infection, perforation, anesthesia/cardiac and pulmonary complications).  Patient verbalized understanding and gave verbal consent to proceed with colonoscopy.  Continue supportive care. Continue to monitor H&H with transfusion as needed to maintain Hgb >7.  If recurrent destabilizing bleeding, proceed with CT-A, followed by IR consultation if positive.  [facility] will follow.  [name] PA-C 01/08/2021, 11:32 AM  Contact #  [phone]

## 2021-01-08 NOTE — Progress Notes (Signed)
OT Cancellation Note  Patient Details Name: [name] MRN: [id number] DOB: [birth year]   Cancelled Treatment:    Reason Eval/Treat Not Completed: Other (comment) Spoke with nursing who reports patient now starting colonoscopy prep. Will check back 6/16 as schedule permits for OT.   Delbert Phenix OT OT pager: [phone]   Rosemary Holms 01/08/2021, 12:54 PM

## 2021-01-08 NOTE — Progress Notes (Signed)
PT Cancellation Note  Patient Details Name: [name] MRN: [id number] DOB: 10-Dec-1950   Cancelled Treatment:    Reason Eval/Treat Not Completed: Medical issues which prohibited therapy. Spoke with RN: Pt is still hypotensive. Plan is for blood transfusion today. RN recommended we hold PT until after transfusion. will check back as schedule allows.   [facility]  Office: [phone] Pager: [phone]

## 2021-01-09 ENCOUNTER — Telehealth: Payer: Self-pay | Admitting: Family Medicine

## 2021-01-09 ENCOUNTER — Encounter (HOSPITAL_COMMUNITY): Payer: Self-pay | Admitting: Internal Medicine

## 2021-01-09 ENCOUNTER — Inpatient Hospital Stay (HOSPITAL_COMMUNITY): Payer: Medicare Other | Admitting: Anesthesiology

## 2021-01-09 ENCOUNTER — Encounter (HOSPITAL_COMMUNITY)
Admission: EM | Disposition: A | Discharge status: Home / Self Care | Payer: Self-pay | Source: Home / Self Care | Attending: Internal Medicine

## 2021-01-09 DIAGNOSIS — K922 Gastrointestinal hemorrhage, unspecified: Secondary | ICD-10-CM | POA: Diagnosis not present

## 2021-01-09 HISTORY — PX: COLONOSCOPY: SHX5424

## 2021-01-09 LAB — COMPREHENSIVE METABOLIC PANEL
ALT: 8 U/L (ref 0–44)
AST: 18 U/L (ref 15–41)
Albumin: 3.6 g/dL (ref 3.5–5.0)
Alkaline Phosphatase: 48 U/L (ref 38–126)
Anion gap: 3 — ABNORMAL LOW (ref 5–15)
BUN: 17 mg/dL (ref 8–23)
CO2: 26 mmol/L (ref 22–32)
Calcium: 8.2 mg/dL — ABNORMAL LOW (ref 8.9–10.3)
Chloride: 108 mmol/L (ref 98–111)
Creatinine, Ser: 0.97 mg/dL (ref 0.61–1.24)
GFR, Estimated: >60 mL/min (ref >60)
Glucose, Bld: 89 mg/dL (ref 70–99)
Potassium: 3.6 mmol/L (ref 3.5–5.1)
Sodium: 137 mmol/L (ref 135–145)
Total Bilirubin: 0.7 mg/dL (ref 0.3–1.2)
Total Protein: 5.7 g/dL — ABNORMAL LOW (ref 6.5–8.1)

## 2021-01-09 LAB — TYPE AND SCREEN
ABO/RH(D): B POS
Antibody Screen: NEGATIVE
Unit division: 0
Unit division: 0

## 2021-01-09 LAB — HEMOGLOBIN AND HEMATOCRIT, BLOOD
HCT: 29.6 % — ABNORMAL LOW (ref 39.0–52.0)
HCT: 28.1 % — ABNORMAL LOW (ref 39.0–52.0)
HCT: 29.6 % — ABNORMAL LOW (ref 39.0–52.0)
Hemoglobin: 9.8 g/dL — ABNORMAL LOW (ref 13.0–17.0)
Hemoglobin: 9.7 g/dL — ABNORMAL LOW (ref 13.0–17.0)
Hemoglobin: 9.9 g/dL — ABNORMAL LOW (ref 13.0–17.0)

## 2021-01-09 LAB — MAGNESIUM: Magnesium: 2.1 mg/dL (ref 1.7–2.4)

## 2021-01-09 LAB — PHOSPHORUS: Phosphorus: 2.7 mg/dL (ref 2.5–4.6)

## 2021-01-09 LAB — BPAM RBC
Blood Product Expiration Date: 202207062359
Blood Product Expiration Date: 202207072359
ISSUE DATE / TIME: 202206151255
ISSUE DATE / TIME: 202206151614
Unit Type and Rh: 7300
Unit Type and Rh: 7300

## 2021-01-09 SURGERY — COLONOSCOPY
Anesthesia: Monitor Anesthesia Care

## 2021-01-09 MED ORDER — PROPOFOL 10 MG/ML IV BOLUS
INTRAVENOUS | Status: AC
  Filled 2021-01-09: qty 20

## 2021-01-09 MED ORDER — SODIUM CHLORIDE 0.9 % IV BOLUS
1000.0000 mL | Freq: Once | INTRAVENOUS | Status: AC
  Administered 2021-01-09: 1000 mL via INTRAVENOUS

## 2021-01-09 MED ORDER — PROPOFOL 500 MG/50ML IV EMUL
INTRAVENOUS | Status: DC | PRN
  Administered 2021-01-09: 125 ug/kg/min via INTRAVENOUS

## 2021-01-09 MED ORDER — LABETALOL HCL 5 MG/ML IV SOLN
10.0000 mg | Freq: Once | INTRAVENOUS | Status: AC
  Administered 2021-01-09: 10 mg via INTRAVENOUS
  Filled 2021-01-09: qty 4

## 2021-01-09 MED ORDER — PSYLLIUM 95 % PO PACK
1.0000 | PACK | Freq: Every day | ORAL | Status: DC
  Administered 2021-01-11 – 2021-01-16 (×4): 1 via ORAL
  Filled 2021-01-09 (×8): qty 1

## 2021-01-09 MED ORDER — PANTOPRAZOLE SODIUM 40 MG PO TBEC
40.0000 mg | DELAYED_RELEASE_TABLET | Freq: Every day | ORAL | Status: DC
  Administered 2021-01-11 – 2021-01-16 (×6): 40 mg via ORAL
  Filled 2021-01-09 (×7): qty 1

## 2021-01-09 MED ORDER — SODIUM CHLORIDE 0.9 % IV SOLN: INTRAVENOUS | Status: DC

## 2021-01-09 MED ORDER — PROPOFOL 1000 MG/100ML IV EMUL
INTRAVENOUS | Status: AC
  Filled 2021-01-09: qty 100

## 2021-01-09 MED ORDER — LIDOCAINE 2% (20 MG/ML) 5 ML SYRINGE
INTRAMUSCULAR | Status: DC | PRN
  Administered 2021-01-09: 80 mg via INTRAVENOUS

## 2021-01-09 MED ORDER — PROPOFOL 10 MG/ML IV BOLUS
INTRAVENOUS | Status: DC | PRN
  Administered 2021-01-09: 20 mg via INTRAVENOUS

## 2021-01-09 MED ORDER — LACTATED RINGERS IV SOLN
INTRAVENOUS | Status: AC | PRN
  Administered 2021-01-09: 10 mL/h via INTRAVENOUS

## 2021-01-09 NOTE — Anesthesia Postprocedure Evaluation (Signed)
Anesthesia Post Note  Patient: [name]  Procedure(s) Performed: COLONOSCOPY     Patient location during evaluation: Endoscopy Anesthesia Type: MAC Level of consciousness: awake and alert Pain management: pain level controlled Vital Signs Assessment: post-procedure vital signs reviewed and stable Respiratory status: spontaneous breathing, nonlabored ventilation, respiratory function stable and patient connected to nasal cannula oxygen Cardiovascular status: blood pressure returned to baseline and stable Postop Assessment: no apparent nausea or vomiting Anesthetic complications: no   No notable events documented.  Last Vitals:  Vitals:   01/09/21 0901 01/09/21 0938  BP: (!) 189/75 (!) 179/103  Pulse: 70 63  Resp: 17 18  Temp:  36.4 C  SpO2: 100% 99%    Last Pain:  Vitals:   01/09/21 0938  TempSrc: Axillary  PainSc:                  [place]et [place]

## 2021-01-09 NOTE — Evaluation (Signed)
Occupational Therapy Evaluation Patient Details Name: [name] MRN: [id number] DOB: 1950/12/17 Today's Date: 01/09/2021    History of Present Illness Patient is a 70 year old male presenting to ED with syncopal episode. CTA of abdomen/pelvis showed localized bleeding to the colon. Patient s/p colonoscopy 6/16. PMH includes Parkinson disease, BPH, HTN, HLD and GI bleed   Clinical Impression   Patient lives with spouse in two story house with 3 steps to enter, can stay on main level. At baseline spouse reports patient is independent with self care tasks, typically does not use adaptive device and has been going to outpatient PT for neuro and aquatic. Currently patient is mildly impulsive, spouse reports patient trying to get up without assist to use bathroom. Educate patient and at end of session able to verbalize using call light system and waiting for assist before getting up. Patient endorses light headed feeling in standing, with hand held assist able to ambulate to recliner chair. Overall min A for safety for standing activity due to hx of syncope including with nursing staff in ICU while on bedside commode. Recommend continued acute OT services to maximize patient safety and independence with self care in order to facilitate D/C home with spouse.     Follow Up Recommendations  No OT follow up;Supervision/Assistance - 24 hour (at least initial)    Equipment Recommendations  None recommended by OT       Precautions / Restrictions Precautions Precautions: Fall Precaution Comments: hx orthostatic hypotension/syncope Restrictions Weight Bearing Restrictions: No      Mobility Bed Mobility               General bed mobility comments: OOB upon arrival    Transfers Overall transfer level: Needs assistance Equipment used: 1 person hand held assist Transfers: Sit to/from Stand Sit to Stand: Min assist         General transfer comment: light min A for safety to power up  to standing for balance, hand held assist to ambulate in room    Balance Overall balance assessment: Mild deficits observed, not formally tested                                         ADL either performed or assessed with clinical judgement   ADL Overall ADL's : Needs assistance/impaired     Grooming: Set up;Sitting   Upper Body Bathing: Set up;Sitting   Lower Body Bathing: Minimal assistance;Sit to/from stand   Upper Body Dressing : Set up;Sitting   Lower Body Dressing: Set up;Minimal assistance;Sitting/lateral leans;Sit to/from stand Lower Body Dressing Details (indicate cue type and reason): seated patient is able to don clean socks, standing min A for safety as patient is tremulous and hx of orthostatic hypotension Toilet Transfer: Minimal assistance;Ambulation Toilet Transfer Details (indicate cue type and reason): hand held assistance for safety due to patient tremulous and hx of orthostatic hypotension Toileting- Clothing Manipulation and Hygiene: Minimal assistance;Sit to/from stand Toileting - Clothing Manipulation Details (indicate cue type and reason): for balance, patient was able to complete perianal care in standing after bowel movement     Functional mobility during ADLs: Minimal assistance        Pertinent Vitals/Pain Pain Assessment: No/denies pain     Hand Dominance Right   Extremity/Trunk Assessment Upper Extremity Assessment Upper Extremity Assessment: Overall WFL for tasks assessed   Lower Extremity Assessment Lower Extremity Assessment:  Defer to PT evaluation   Cervical / Trunk Assessment Cervical / Trunk Assessment: Normal   Communication Communication Communication: No difficulties   Cognition Arousal/Alertness: Awake/alert Behavior During Therapy: WFL for tasks assessed/performed;Impulsive Overall Cognitive Status: Impaired/Different from baseline Area of Impairment: Safety/judgement                          Safety/Judgement: Decreased awareness of safety     General Comments: upon arrival patient trying to stand to use bathroom with spouse in room trying to redirect patient. by end of session patient able to verbalize need for using call light and waiting for assistance for OOB/chair activity. patient grossly oriented and following directions appropriately, spouse reporting intermittent confusion from multiple procedures/anesthesia and multiple days in [place] expects to be discharged to:: Private residence Living Arrangements: Spouse/significant other Available Help at Discharge: Family Type of Home: House Home Access: Stairs to enter [employer] of Steps: 3   Home Layout: Two level;Able to live on main level with bedroom/bathroom     Bathroom Shower/Tub: Occupational psychologist: Handicapped height     Home Equipment: Grab bars - tub/shower;Walker - 2 wheels;Crutches;Cane - single point;Shower seat          Prior Functioning/Environment Level of Independence: Independent        Comments: patient going to physical therapy including aquadic        OT Problem List: Impaired balance (sitting and/or standing);Decreased activity tolerance;Decreased safety awareness;Decreased cognition      OT Treatment/Interventions: Self-care/ADL training;Patient/family education;Balance training;Therapeutic activities    OT Goals(Current goals can be found in the care plan section) Acute Rehab OT Goals Patient Stated Goal: resume outpatient PT OT Goal Formulation: With patient/family Time For Goal Achievement: 01/23/21 Potential to Achieve Goals: Good  OT Frequency: Min 2X/week           Co-evaluation PT/OT/SLP Co-Evaluation/Treatment: Yes Reason for Co-Treatment: For patient/therapist safety;To address functional/ADL transfers PT goals addressed during session: Mobility/safety with mobility OT goals addressed during  session: ADL's and self-care      AM-PAC OT "6 Clicks" Daily Activity     Outcome Measure Help from another person eating meals?: None Help from another person taking care of personal grooming?: A Little Help from another person toileting, which includes using toliet, bedpan, or urinal?: A Little Help from another person bathing (including washing, rinsing, drying)?: A Little Help from another person to put on and taking off regular upper body clothing?: A Little Help from another person to put on and taking off regular lower body clothing?: A Little 6 Click Score: 19   End of Session Nurse Communication: Mobility status  Activity Tolerance: Patient tolerated treatment well Patient left: in chair;with call bell/phone within reach;with chair alarm set  OT Visit Diagnosis: Unsteadiness on feet (R26.81);History of falling (Z91.81)                Time: 5170-0174 OT Time Calculation (min): 23 min Charges:  OT General Charges $OT Visit: 1 Visit OT Evaluation $OT Eval Low Complexity: [place] OT OT pager: [phone]  Rosemary Holms 01/09/2021, 3:05 PM

## 2021-01-09 NOTE — Significant Event (Signed)
Patient became hypotensive at around 2:30 PM.  Blood pressure was in the range of 70s.  Patient seen and examined at the bedside.  IV fluids were given bolus, maintenance IV was started.  Patient did not have any bloody bowel movement.  He denied any abdominal pain but complained of some dizziness and lightheadedness.  Stat H&H checked and it was in the range of 9.  Hypotension was most likely from antihypertensives which have been held.  Patient will be transferred back to stepdown for close monitoring.  Blood pressure improving with IV fluids

## 2021-01-09 NOTE — Op Note (Signed)
[facility] Patient Name: [name] Procedure Date: 01/09/2021 MRN: [id number] Attending MD: Ronnette Juniper , MD Date of Birth: 10/25/1950 CSN: [id number] Age: 70 Admit Type: Inpatient Procedure:                Colonoscopy Indications:              Last colonoscopy: 2015, Hematochezia, Acute post                            hemorrhagic anemia Providers:                Ronnette Juniper, MD, Clyde Lundborg, RN, Elspeth Cho                            Tech., Technician, Larene Beach, CRNA Referring MD:             [facility] Medicines:                Monitored Anesthesia Care Complications:            No immediate complications. Estimated Blood Loss:     Estimated blood loss: none. Procedure:                Pre-Anesthesia Assessment:                           - Prior to the procedure, a History and Physical                            was performed, and patient medications and                            allergies were reviewed. The patient's tolerance of                            previous anesthesia was also reviewed. The risks                            and benefits of the procedure and the sedation                            options and risks were discussed with the patient.                            All questions were answered, and informed consent                            was obtained. Prior Anticoagulants: The patient has                            taken no previous anticoagulant or antiplatelet                            agents. ASA Grade Assessment: III - A patient with  severe systemic disease. After reviewing the risks                            and benefits, the patient was deemed in                            satisfactory condition to undergo the procedure.                           After obtaining informed consent, the colonoscope                            was passed under direct vision. Throughout the                             procedure, the patient's blood pressure, pulse, and                            oxygen saturations were monitored continuously. The                            [id number]-H190DL ([id number]) Olympus pediatric colonscope                            was introduced through the anus and advanced to the                            the terminal ileum. The colonoscopy was performed                            without difficulty. The patient tolerated the                            procedure well. The quality of the bowel                            preparation was poor. Scope In: 7:47:32 AM Scope Out: 8:31:46 AM Scope Withdrawal Time: 0 hours 35 minutes 16 seconds  Total Procedure Duration: 0 hours 44 minutes 14 seconds  Findings:      The perianal and digital rectal examinations were normal.      Old blood and several clots were found in the entire colon.      The terminal ileum appeared normal.      Multiple small and large-mouthed diverticula were found in the sigmoid       colon, descending colon, transverse colon, hepatic flexure and ascending       colon.      A large volume of water was used for thorough lavage. Although there       were multiple diverticulae, with clots on some of them, there was no       active bleeding noted after washing.      The underlying mucosa appeared unremarkable.      Non-bleeding internal hemorrhoids were found during retroflexion. Impression:               - Preparation of the colon  was poor.                           - Old blood and clots in the entire examined colon.                           - The examined portion of the ileum was normal.                           - Diverticulosis in the sigmoid colon, in the                            descending colon, in the transverse colon, at the                            hepatic flexure and in the ascending colon.                           - Non-bleeding internal hemorrhoids.                           - No specimens  collected. Moderate Sedation:      Patient did not receive moderate sedation for this procedure, but       instead received monitored anesthesia care. Recommendation:           - High fiber diet.                           - Continue present medications.                           - If there is evidence of recurrent hematochezia,                            recommend Stat NM GI Blood loss scan and IR                            re-evaluation. Procedure Code(s):        --- Professional ---                           [phone], Colonoscopy, flexible; diagnostic, including                            collection of specimen(s) by brushing or washing,                            when performed (separate procedure) Diagnosis Code(s):        --- Professional ---                           Y86.5, Other hemorrhoids                           K92.2, Gastrointestinal hemorrhage, unspecified  K92.1, Melena (includes Hematochezia)                           D62, Acute posthemorrhagic anemia                           K57.30, Diverticulosis of large intestine without                            perforation or abscess without bleeding CPT copyright 2019 [employer]. All rights reserved. The codes documented in this report are preliminary and upon coder review may  be revised to meet current compliance requirements. Ronnette Juniper, MD 01/09/2021 8:50:27 AM This report has been signed electronically. Number of Addenda: 0

## 2021-01-09 NOTE — Progress Notes (Signed)
PROGRESS NOTE    [name]  KPV:[id number] DOB: 20-Nov-1950 DOA: 01/02/2021 PCP: Denita Lung, MD   Chief Complain: Black stools  Brief Narrative: Patient is a 70 year old African-American male with past medical history of Parkinson's disease, BPH, hypertension, hyperlipidemia, previous GI bleed who presented to the emergency department with complaints of positive black stools.  Patient was found to be anemic in presentation.  GI was consulted after the hospitalization.  He underwent EGD without finding of any source of bleeding.  Patient underwent CTA abdomen/pelvis that localized bleeding on the colon area.  IR was consulted but ablation was not successful.  Patient also complained of chest discomfort on 01/05/2021, had mildly elevated troponin for which cardiology was consulted.  Patient underwent colonoscopy today.  Assessment & Plan:   Active Problems:   Acute GI bleeding   GI bleed  Acute lower GI bleeding/diverticulosis: Presented with black/maroon-colored stools.  CT abdomen/pelvis was unremarkable.  CTA localized bleeding on the ascending colon but embolization was not successful as there was no significant bleeding during the procedure.  GI consulted and following.  EGD did not show any source of bleeding.  Underwent colonoscopy today with finding of pandiverticulosis, no active bleeding, nonbleeding internal hemorrhoids.  Diverticular bleeding is the most likely cause.  If patient bleeds again, we will do a stat NM GI blood loss scan and IR reevaluation. Will recommend patient to have high-fiber diet in the future.  Acute blood loss anemia: Suspected to be from diverticular bleed.  Transfused with 2 units of packed RBC during this hospitalization.  Continue to monitor CBC.  Transfuse if hemoglobin less than 7.  Hemoglobin in the range of 9 today.  Hypertension/hypotension: Most likely from acute blood loss anemia..  Home antihypertensives were on hold.  Patient was found to be  orthostatic earlier.  Currently patient is hypotensive.  Will resume antihypertensives.  Monitor blood pressure  Elevated troponin/chest discomfort: Likely from supply demand ischemia from severe anemia.  Echocardiogram showed a preserved left ventricular fraction with grade 2 diastolic dysfunction.  No regional wall motion abnormality.  Continue statin.  Cardiology were following.  No plan for further work-up.  Parkinson disease: Continue Sinemet.  PT/OT consulted. There is report of intermittent confusion during this hospitalization.  Likely from hospital-acquired delirium.  Continue to monitor mental status.  Usually at baseline, he is alert and oriented.  BPH: On alfuzosin at home            DVT prophylaxis:SCD Code Status: Full Family Communication: Discussed with wife on phone on 01/09/2021 Status is: Inpatient  Remains inpatient appropriate because:Hemodynamically unstable  Dispo: The patient is from: Home              Anticipated d/c is to: Home              Patient currently is not medically stable to d/c.   Difficult to place patient No     Consultants: GI  Procedures:EGD/colonoscopy  Antimicrobials:  Anti-infectives (From admission, onward)    None       Subjective:  Patient seen and examined at the bedside this morning.  He just came back from the colonoscopy.  He denied any complaints.  His blood pressure was high when I evaluated him.  He denies any abdominal pain, had a bowel movement when I was in the room.  We will transfer him to the floor.   Objective: Vitals:   01/09/21 0515 01/09/21 0634 01/09/21 0700 01/09/21 0714  BP: [name])  177/102 (!) 178/107  (!) 183/106  Pulse: 60 65 70 62  Resp: 17 15 12 14   Temp:    98.1 F (36.7 C)  TempSrc:    Oral  SpO2: 100% 100% 96% 99%  Weight:    69.6 kg  Height:    5\' 10"  (1.778 m)    Intake/Output Summary (Last 24 hours) at 01/09/2021 0835 Last data filed at 01/09/2021 0318 Gross per 24 hour  Intake  2270 ml  Output 2725 ml  Net -455 ml   Filed Weights   01/02/21 1213 01/02/21 2017 01/09/21 0714  Weight: 72.6 kg 69.6 kg 69.6 kg    Examination:  General exam: Appears calm and comfortable ,Not in distress,average built HEENT:PERRL,Oral mucosa moist, Ear/Nose normal on gross exam Respiratory system: Bilateral equal air entry, normal vesicular breath sounds, no wheezes or crackles  Cardiovascular system: S1 & S2 heard, RRR. No JVD, murmurs, rubs, gallops or clicks. No pedal edema. Gastrointestinal system: Abdomen is nondistended, soft and nontender. No organomegaly or masses felt. Normal bowel sounds heard. Central nervous system: Alert and awake,obeys commands Extremities: No edema, no clubbing ,no cyanosis Skin: No rashes, lesions or ulcers,no icterus ,no pallor   Data Reviewed: I have personally reviewed following labs and imaging studies  CBC: Recent Labs  Lab 01/04/21 0831 01/04/21 1523 01/05/21 0323 01/05/21 1140 01/05/21 1821 01/06/21 0257 01/06/21 1657 01/07/21 0245 01/07/21 1538 01/08/21 0247 01/08/21 2135 01/09/21 0249  WBC 9.2   < > 9.1  --  9.0 9.0 9.1 10.5  --   --   --   --   NEUTROABS 6.1  --   --   --   --   --   --   --   --   --   --   --   HGB 7.9*   < > 6.5*   < > 7.7* 7.7* 8.0* 7.8* 7.4* 7.1* 11.0* 9.8*  HCT 24.0*   < > 20.3*   < > 23.2* 23.2* 24.2* 24.0* 22.5* 22.0* 32.7* 29.6*  MCV 95.2   < > 98.1  --  91.3 90.6 92.0 92.7  --   --   --   --   PLT 221   < > 177  --  145* 164 170 164  --   --   --   --    < > = values in this interval not displayed.   Basic Metabolic Panel: Recent Labs  Lab 01/03/21 0412 01/04/21 0011 01/04/21 0830 01/07/21 0245 01/09/21 0249  NA 135 133* 135 138 137  K 4.1 3.9 4.0 4.5 3.6  CL 104 103 105 109 108  CO2 26 24 23 25 26   GLUCOSE 77 119* 136* 102* 89  BUN 32* 33* 35* 24* 17  CREATININE 0.94 1.06 1.17 0.99 0.97  CALCIUM 8.5* 8.2* 8.1* 8.1* 8.2*  MG  --   --   --  2.0 2.1  PHOS  --   --   --  2.9 2.7    GFR: Estimated Creatinine Clearance: 68.8 mL/min (by C-G formula based on SCr of 0.97 mg/dL). Liver Function Tests: Recent Labs  Lab 01/02/21 1238 01/09/21 0249  AST 13* 18  ALT 8 8  ALKPHOS 37* 48  BILITOT 0.5 0.7  PROT 5.3* 5.7*  ALBUMIN 3.1* 3.6   No results for input(s): LIPASE, AMYLASE in the last 168 hours. No results for input(s): AMMONIA in the last 168 hours. Coagulation Profile: No results for input(s): INR, PROTIME  in the last 168 hours. Cardiac Enzymes: No results for input(s): CKTOTAL, CKMB, CKMBINDEX, TROPONINI in the last 168 hours. BNP (last 3 results) No results for input(s): PROBNP in the last 8760 hours. HbA1C: No results for input(s): HGBA1C in the last 72 hours. CBG: No results for input(s): GLUCAP in the last 168 hours. Lipid Profile: Recent Labs    01/07/21 0245  CHOL 76  HDL 35*  LDLCALC 34  TRIG 36  CHOLHDL 2.2   Thyroid Function Tests: No results for input(s): TSH, T4TOTAL, FREET4, T3FREE, THYROIDAB in the last 72 hours. Anemia Panel: No results for input(s): VITAMINB12, FOLATE, FERRITIN, TIBC, IRON, RETICCTPCT in the last 72 hours. Sepsis Labs: No results for input(s): PROCALCITON, LATICACIDVEN in the last 168 hours.  Recent Results (from the past 240 hour(s))  Resp Panel by RT-PCR (Flu A&B, Covid) Nasopharyngeal Swab     Status: None   Collection Time: 01/02/21  3:45 PM   Specimen: Nasopharyngeal Swab; Nasopharyngeal(NP) swabs in vial transport medium  Result Value Ref Range Status   SARS Coronavirus 2 by RT PCR NEGATIVE NEGATIVE Final    Comment: (NOTE) SARS-CoV-2 target nucleic acids are NOT DETECTED.  The SARS-CoV-2 RNA is generally detectable in upper respiratory specimens during the acute phase of infection. The lowest concentration of SARS-CoV-2 viral copies this assay can detect is 138 copies/mL. A negative result does not preclude SARS-Cov-2 infection and should not be used as the sole basis for treatment or other  patient management decisions. A negative result may occur with  improper specimen collection/handling, submission of specimen other than nasopharyngeal swab, presence of viral mutation(s) within the areas targeted by this assay, and inadequate number of viral copies(<138 copies/mL). A negative result must be combined with clinical observations, patient history, and epidemiological information. The expected result is Negative.  Fact Sheet for Patients:  EntrepreneurPulse.com.au  Fact Sheet for Healthcare Providers:  IncredibleEmployment.be  This test is no t yet approved or cleared by the [place] FDA and  has been authorized for detection and/or diagnosis of SARS-CoV-2 by FDA under an Emergency Use Authorization (EUA). This EUA will remain  in effect (meaning this test can be used) for the duration of the COVID-19 declaration under Section 564(b)(1) of the Act, 21 U.S.C.section 360bbb-3(b)(1), unless the authorization is terminated  or revoked sooner.       Influenza A by PCR NEGATIVE NEGATIVE Final   Influenza B by PCR NEGATIVE NEGATIVE Final    Comment: (NOTE) The Xpert Xpress SARS-CoV-2/FLU/RSV plus assay is intended as an aid in the diagnosis of influenza from Nasopharyngeal swab specimens and should not be used as a sole basis for treatment. Nasal washings and aspirates are unacceptable for Xpert Xpress SARS-CoV-2/FLU/RSV testing.  Fact Sheet for Patients: EntrepreneurPulse.com.au  Fact Sheet for Healthcare Providers: IncredibleEmployment.be  This test is not yet approved or cleared by the [place] FDA and has been authorized for detection and/or diagnosis of SARS-CoV-2 by FDA under an Emergency Use Authorization (EUA). This EUA will remain in effect (meaning this test can be used) for the duration of the COVID-19 declaration under Section 564(b)(1) of the Act, 21 U.S.C. section  360bbb-3(b)(1), unless the authorization is terminated or revoked.  Performed at [facility], [place] [address] [place], [place] [address]   MRSA PCR Screening     Status: None   Collection Time: 01/04/21 11:52 AM   Specimen: Nasal Mucosa; Nasopharyngeal  Result Value Ref Range Status   MRSA by PCR NEGATIVE NEGATIVE  Final    Comment:        The GeneXpert MRSA Assay (FDA approved for NASAL specimens only), is one component of a comprehensive MRSA colonization surveillance program. It is not intended to diagnose MRSA infection nor to guide or monitor treatment for MRSA infections. Performed at [facility], [place] [address] [place], [place] [address]          Radiology Studies: ECHOCARDIOGRAM COMPLETE  Result Date: 01/07/2021    ECHOCARDIOGRAM REPORT   Patient Name:   [name] Date of Exam: 01/07/2021 Medical Rec #:  [id number]       Height:       70.0 in Accession #:    [id number]      Weight:       153.4 lb Date of Birth:  1951-04-15       BSA:          1.865 m Patient Age:    63 years        BP:           109/60 mmHg Patient Gender: M               HR:           75 bpm. Exam Location:  Inpatient Procedure: 2D Echo, Cardiac Doppler and Color Doppler Indications:    Chest pain, elevated troponin  History:        Patient has prior history of Echocardiogram examinations, most                 recent 01/20/2012. Signs/Symptoms:Chest Pain; Risk                 Factors:Dyslipidemia and Hypertension. 01/20/2012 stress echo.  Sonographer:    Luisa Hart RDCS Referring Phys: [place]  1. Left ventricular ejection fraction, by estimation, is 60 to 65%. The left ventricle has normal function. The left ventricle has no regional wall motion abnormalities. Left ventricular diastolic parameters are consistent with Grade II diastolic dysfunction (pseudonormalization).  2. Right ventricular systolic function is normal. The right ventricular  size is mildly enlarged. Tricuspid regurgitation signal is inadequate for assessing PA pressure.  3. Left atrial size was moderately dilated.  4. The mitral valve is normal in structure. No evidence of mitral valve regurgitation. No evidence of mitral stenosis.  5. The aortic valve is tricuspid. Aortic valve regurgitation is trivial. Mild to moderate aortic valve sclerosis/calcification is present, without any evidence of aortic stenosis.  6. The inferior vena cava is normal in size with greater than 50% respiratory variability, suggesting right atrial pressure of 3 mmHg. FINDINGS  Left Ventricle: Left ventricular ejection fraction, by estimation, is 60 to 65%. The left ventricle has normal function. The left ventricle has no regional wall motion abnormalities. The left ventricular internal cavity size was normal in size. There is  no left ventricular hypertrophy. Left ventricular diastolic parameters are consistent with Grade II diastolic dysfunction (pseudonormalization). Right Ventricle: The right ventricular size is mildly enlarged.Right ventricular systolic function is normal. Tricuspid regurgitation signal is inadequate for assessing PA pressure. The tricuspid regurgitant velocity is 2.33 m/s, and with an assumed right atrial pressure of 3 mmHg, the estimated right ventricular systolic pressure is 71.2 mmHg. Left Atrium: Left atrial size was moderately dilated. Right Atrium: Right atrial size was normal in size. Pericardium: Trivial pericardial effusion is present. Mitral Valve: The mitral valve is normal in structure. No evidence of mitral valve regurgitation. No evidence  of mitral valve stenosis. Tricuspid Valve: The tricuspid valve is normal in structure. Tricuspid valve regurgitation is mild . No evidence of tricuspid stenosis. Aortic Valve: The aortic valve is tricuspid. Aortic valve regurgitation is trivial. Aortic regurgitation PHT measures 1040 msec. Mild to moderate aortic valve  sclerosis/calcification is present, without any evidence of aortic stenosis. Aortic valve mean gradient measures 4.5 mmHg. Aortic valve peak gradient measures 8.0 mmHg. Aortic valve area, by VTI measures 2.71 cm. Pulmonic Valve: The pulmonic valve was normal in structure. Pulmonic valve regurgitation is trivial. No evidence of pulmonic stenosis. Aorta: The aortic root is normal in size and structure. Venous: The inferior vena cava is normal in size with greater than 50% respiratory variability, suggesting right atrial pressure of 3 mmHg. IAS/Shunts: No atrial level shunt detected by color flow Doppler.  LEFT VENTRICLE PLAX 2D LVIDd:         4.70 cm     Diastology LVIDs:         2.70 cm     LV e' medial:    6.09 cm/s LV PW:         1.10 cm     LV E/e' medial:  13.6 LV IVS:        1.00 cm     LV e' lateral:   7.51 cm/s LVOT diam:     2.30 cm     LV E/e' lateral: 11.0 LV SV:         84 LV SV Index:   45 LVOT Area:     4.15 cm  LV Volumes (MOD) LV vol d, MOD A2C: 48.9 ml LV vol d, MOD A4C: 94.0 ml LV vol s, MOD A2C: 16.9 ml LV vol s, MOD A4C: 27.6 ml LV SV MOD A2C:     32.0 ml LV SV MOD A4C:     94.0 ml LV SV MOD BP:      47.7 ml RIGHT VENTRICLE RV Basal diam:  4.40 cm RV Mid diam:    3.20 cm RV S prime:     16.00 cm/s TAPSE (M-mode): 2.1 cm LEFT ATRIUM              Index       RIGHT ATRIUM           Index LA diam:        3.80 cm  2.04 cm/m  RA Area:     17.60 cm LA Vol (A2C):   141.0 ml 75.60 ml/m RA Volume:   47.20 ml  25.31 ml/m LA Vol (A4C):   65.4 ml  35.07 ml/m LA Biplane Vol: 105.0 ml 56.30 ml/m  AORTIC VALVE                   PULMONIC VALVE AV Area (Vmax):    2.61 cm    PV Vmax:       0.84 m/s AV Area (Vmean):   2.48 cm    PV Vmean:      67.800 cm/s AV Area (VTI):     2.71 cm    PV VTI:        0.222 m AV Vmax:           141.00 cm/s PV Peak grad:  2.8 mmHg AV Vmean:          96.000 cm/s PV Mean grad:  2.0 mmHg AV VTI:            0.312 m AV Peak Grad:  8.0 mmHg AV Mean Grad:      4.5 mmHg LVOT Vmax:          88.60 cm/s LVOT Vmean:        57.400 cm/s LVOT VTI:          0.203 m LVOT/AV VTI ratio: 0.65 AI PHT:            1040 msec  AORTA Ao Root diam: 3.60 cm Ao Asc diam:  3.10 cm MITRAL VALVE               TRICUSPID VALVE MV Area (PHT): 2.63 cm    TR Peak grad:   21.7 mmHg MV Decel Time: 288 msec    TR Vmax:        233.00 cm/s MV E velocity: 82.60 cm/s MV A velocity: 69.50 cm/s  SHUNTS MV E/A ratio:  1.19        Systemic VTI:  0.20 m                            Systemic Diam: 2.30 cm Kirk Ruths MD Electronically signed by Kirk Ruths MD Signature Date/Time: 01/07/2021/11:42:42 AM    Final         Scheduled Meds:  [QAS Hold] sodium chloride   Intravenous Once   [MAR Hold] sodium chloride   Intravenous Once   [MAR Hold] alfuzosin  10 mg Oral Daily   [MAR Hold] atorvastatin  40 mg Oral QPM   [MAR Hold] carbidopa-levodopa  1.5 tablet Oral TID   [MAR Hold] Chlorhexidine Gluconate Cloth  6 each Topical Daily   [MAR Hold] citalopram  20 mg Oral Daily   [MAR Hold] isosorbide mononitrate  30 mg Oral Daily   [MAR Hold] mouth rinse  15 mL Mouth Rinse BID   [MAR Hold] metoprolol tartrate  50 mg Oral BID   [MAR Hold]  morphine injection  2 mg Intravenous Once   [MAR Hold] multivitamin with minerals  1 tablet Oral Daily   [MAR Hold] pantoprazole (PROTONIX) IV  40 mg Intravenous Q12H   [MAR Hold] timolol  1 drop Both Eyes Daily   [MAR Hold] trihexyphenidyl  1 mg Oral BID WC   [MAR Hold] verapamil  240 mg Oral Daily   Continuous Infusions:  sodium chloride Stopped (01/08/21 1943)     LOS: 5 days    Time spent: 35 mins.More than 50% of that time was spent in counseling and/or coordination of care.      Shelly Coss, MD [facility] P6/16/2022, 8:35 AM

## 2021-01-09 NOTE — Progress Notes (Signed)
Report called to 5E RN. All questions answered at this time. All pt belongings and paper chart transported with pt. Pt was transported in the wheelchair on tele by RN and NT. 5E will continue to care for pt.

## 2021-01-09 NOTE — Interval H&P Note (Signed)
History and Physical Interval Note: 70/male with rectal bleeding for a colonoscopy.  01/09/2021 7:28 AM  [name]  has presented today for colonoscopy, with the diagnosis of rectal bleeding, anemia.  The various methods of treatment have been discussed with the patient and family. After consideration of risks, benefits and other options for treatment, the patient has consented to  Procedure(s): COLONOSCOPY (N/A) as a surgical intervention.  The patient's history has been reviewed, patient examined, no change in status, stable for surgery.  I have reviewed the patient's chart and labs.  Questions were answered to the patient's satisfaction.     Ronnette Juniper

## 2021-01-09 NOTE — Evaluation (Signed)
Physical Therapy Evaluation Patient Details Name: [name] MRN: [id number] DOB: August 22, 1950 Today's Date: 01/09/2021   History of Present Illness  Patient is a 70 year old male presenting to ED with syncopal episode. CTA of abdomen/pelvis showed localized bleeding to the colon. Patient s/p colonoscopy 6/16. PMH includes Parkinson disease, BPH, HTN, HLD and GI bleed.    Clinical Impression  [name] is 70 y.o. male admitted with above HPI and diagnosis. Patient is currently limited by functional impairments below (see PT problem list). Patient lives with his wife and is independent at baseline. Patient standing EOB with wife assisting to use urinal. Pt provided BSC and required min assist to steady with transfers and short bout of gait in room. Gait limited due to c/o lightheadedness. Patient will benefit from continued skilled PT interventions to address impairments and progress independence with mobility, recommending OOPT follow up with aquatic therapy as pt was actively participating in this PTA. Acute PT will follow and progress as able.       Follow Up Recommendations Outpatient PT (pt was participating in [place] for aquatic therapy and wants to continue)    Equipment Recommendations  None recommended by PT    Recommendations for Other Services       Precautions / Restrictions Precautions Precautions: Fall Precaution Comments: hx orthostatic hypotension/syncope Restrictions Weight Bearing Restrictions: No      Mobility  Bed Mobility               General bed mobility comments: OOB upon arrival    Transfers Overall transfer level: Needs assistance Equipment used: 1 person hand held assist Transfers: Sit to/from Stand Sit to Stand: Min assist         General transfer comment: Min assist for safety to rise from EOB and BSC and steady. Pt with slight tremors at both rest and with standing to complete pericare after  BM.  Ambulation/Gait Ambulation/Gait assistance: Min assist Gait Distance (Feet): 12 Feet Assistive device: 1 person hand held assist Gait Pattern/deviations: Step-through pattern;Decreased stride length;Shuffle;Drifts right/left Gait velocity: decr   General Gait Details: HHA required to ambulate short distance around bed. Patient required min assist to steady and reported slight lightheaded sensation/dizziness so distance limited.  Stairs            Wheelchair Mobility    Modified Rankin (Stroke Patients Only)       Balance Overall balance assessment: Mild deficits observed, not formally tested                                           Pertinent Vitals/Pain Pain Assessment: No/denies pain    Home Living Family/patient expects to be discharged to:: Private residence Living Arrangements: Spouse/significant other Available Help at Discharge: Family Type of Home: House Home Access: Stairs to enter   Technical brewer of Steps: 3 Home Layout: Two level;Able to live on main level with bedroom/bathroom Home Equipment: Grab bars - tub/shower;Walker - 2 wheels;Crutches;Cane - single point;Shower seat      Prior Function Level of Independence: Independent         Comments: patient going to physical therapy including aquadic     Hand Dominance   Dominant Hand: Right    Extremity/Trunk Assessment   Upper Extremity Assessment Upper Extremity Assessment: Defer to OT evaluation    Lower Extremity Assessment Lower Extremity Assessment: Generalized weakness  Cervical / Trunk Assessment Cervical / Trunk Assessment: Normal  Communication   Communication: No difficulties  Cognition Arousal/Alertness: Awake/alert Behavior During Therapy: WFL for tasks assessed/performed;Impulsive Overall Cognitive Status: Impaired/Different from baseline Area of Impairment: Safety/judgement                         Safety/Judgement:  Decreased awareness of safety     General Comments: upon arrival patient trying to stand to use bathroom with spouse in room trying to re-direct patient. by end of session patient able to verbalize need for using call light and waiting for assistance for OOB/chair activity. patient grossly oriented and following directions appropriately, spouse reporting intermittent confusion from multiple procedures/anesthesia and multiple days in ICU      General Comments      Exercises     Assessment/Plan    PT Assessment Patient needs continued PT services  PT Problem List Decreased strength;Decreased activity tolerance;Decreased balance;Decreased mobility;Decreased cognition;Decreased knowledge of use of DME;Decreased safety awareness       PT Treatment Interventions DME instruction;Gait training;Stair training;Functional mobility training;Therapeutic activities;Balance training;Neuromuscular re-education;Therapeutic exercise;Patient/family education    PT Goals (Current goals can be found in the Care Plan section)  Acute Rehab PT Goals Patient Stated Goal: resume outpatient PT PT Goal Formulation: With patient Time For Goal Achievement: 01/09/21 Potential to Achieve Goals: Good    Frequency Min 3X/week   Barriers to discharge        Co-evaluation   Reason for Co-Treatment: For patient/therapist safety;To address functional/ADL transfers PT goals addressed during session: Mobility/safety with mobility;Balance OT goals addressed during session: ADL's and self-care       AM-PAC PT "6 Clicks" Mobility  Outcome Measure Help needed turning from your back to your side while in a flat bed without using bedrails?: A Little Help needed moving from lying on your back to sitting on the side of a flat bed without using bedrails?: A Little Help needed moving to and from a bed to a chair (including a wheelchair)?: A Little Help needed standing up from a chair using your arms (e.g., wheelchair  or bedside chair)?: A Little Help needed to walk in hospital room?: A Little Help needed climbing 3-5 steps with a railing? : A Little 6 Click Score: 18    End of Session   Activity Tolerance: Patient tolerated treatment well Patient left: in chair;with call bell/phone within reach;with chair alarm set Nurse Communication: Mobility status PT Visit Diagnosis: Muscle weakness (generalized) (M62.81);Difficulty in walking, not elsewhere classified (R26.2);Unsteadiness on feet (R26.81)    Time: 2800-3491 PT Time Calculation (min) (ACUTE ONLY): 23 min   Charges:   PT Evaluation $PT Eval Low Complexity: 1 Low          Verner Mould, DPT Acute Rehabilitation Services Office [phone] Pager [phone]   Jacques Navy 01/09/2021, 4:33 PM

## 2021-01-09 NOTE — Telephone Encounter (Signed)
Pt wife called and wanted to let you know that mike was in the hospital and Is  hoping that he will be out for the weekend

## 2021-01-09 NOTE — Transfer of Care (Signed)
Immediate Anesthesia Transfer of Care Note  Patient: [name]  Procedure(s) Performed: COLONOSCOPY  Patient Location: Endoscopy Unit  Anesthesia Type:MAC  Level of Consciousness: drowsy and patient cooperative  Airway & Oxygen Therapy: Patient Spontanous Breathing and Patient connected to face mask oxygen  Post-op Assessment: Report given to RN and Post -op Vital signs reviewed and stable  Post vital signs: Reviewed and stable  Last Vitals:  Vitals Value Taken Time  BP 168/84   Temp    Pulse 62   Resp 14   SpO2 100%     Last Pain:  Vitals:   01/09/21 0714  TempSrc: Oral  PainSc: 0-No pain      Patients Stated Pain Goal: 0 (41/28/20 8138)  Complications: No notable events documented.

## 2021-01-10 ENCOUNTER — Telehealth: Payer: Self-pay | Admitting: Neurology

## 2021-01-10 DIAGNOSIS — K922 Gastrointestinal hemorrhage, unspecified: Secondary | ICD-10-CM | POA: Diagnosis not present

## 2021-01-10 DIAGNOSIS — R4182 Altered mental status, unspecified: Secondary | ICD-10-CM

## 2021-01-10 LAB — BASIC METABOLIC PANEL
Anion gap: 4 — ABNORMAL LOW (ref 5–15)
BUN: 15 mg/dL (ref 8–23)
CO2: 23 mmol/L (ref 22–32)
Calcium: 8.2 mg/dL — ABNORMAL LOW (ref 8.9–10.3)
Chloride: 111 mmol/L (ref 98–111)
Creatinine, Ser: 1.07 mg/dL (ref 0.61–1.24)
GFR, Estimated: >60 mL/min (ref >60)
Glucose, Bld: 109 mg/dL — ABNORMAL HIGH (ref 70–99)
Potassium: 3.7 mmol/L (ref 3.5–5.1)
Sodium: 138 mmol/L (ref 135–145)

## 2021-01-10 LAB — HEMOGLOBIN AND HEMATOCRIT, BLOOD
HCT: 27.1 % — ABNORMAL LOW (ref 39.0–52.0)
HCT: 25.8 % — ABNORMAL LOW (ref 39.0–52.0)
Hemoglobin: 8.8 g/dL — ABNORMAL LOW (ref 13.0–17.0)
Hemoglobin: 8.6 g/dL — ABNORMAL LOW (ref 13.0–17.0)

## 2021-01-10 LAB — AMMONIA: Ammonia: 20 umol/L (ref 9–35)

## 2021-01-10 MED ORDER — HALOPERIDOL LACTATE 5 MG/ML IJ SOLN
2.0000 mg | Freq: Once | INTRAMUSCULAR | Status: AC
  Administered 2021-01-10: 2 mg via INTRAVENOUS

## 2021-01-10 MED ORDER — HALOPERIDOL LACTATE 5 MG/ML IJ SOLN
2.0000 mg | Freq: Four times a day (QID) | INTRAMUSCULAR | Status: DC | PRN
  Administered 2021-01-13 (×2): 2 mg via INTRAVENOUS
  Filled 2021-01-10 (×2): qty 1

## 2021-01-10 MED ORDER — LABETALOL HCL 5 MG/ML IV SOLN
10.0000 mg | INTRAVENOUS | Status: DC | PRN
  Administered 2021-01-11 – 2021-01-13 (×3): 10 mg via INTRAVENOUS
  Filled 2021-01-10 (×4): qty 4

## 2021-01-10 MED ORDER — HALOPERIDOL LACTATE 5 MG/ML IJ SOLN
INTRAMUSCULAR | Status: AC
  Filled 2021-01-10: qty 1

## 2021-01-10 MED ORDER — QUETIAPINE FUMARATE 50 MG PO TABS
25.0000 mg | ORAL_TABLET | Freq: Every day | ORAL | Status: DC
  Administered 2021-01-10 – 2021-01-13 (×4): 25 mg via ORAL
  Filled 2021-01-10 (×4): qty 1

## 2021-01-10 NOTE — Telephone Encounter (Signed)
Patient has suffered an internal bleed ( GI ) and is delirious, Wife is concerned about his neurological care, he is followed by neuro hospitalist. Wife needs to speak to primary neurologist and wants to make sure care is coordinated. CD

## 2021-01-10 NOTE — TOC Progression Note (Signed)
Transition of Care [facility]) - Progression Note    Patient Details  Name: GEORGE HAGGART MRN: [id number] Date of Birth: 1950/12/02  Transition of [facility]) CM/SW Contact  Leeroy Cha, RN Phone Number: 01/10/2021, 8:45 AM  Clinical Narrative:    Transferred back to sdu due to hypoxia on 061622/on room air am of [phone].  Will follow for progression.   Expected Discharge Plan: Home/Self Care Barriers to Discharge: No Barriers Identified  Expected Discharge Plan and Services Expected Discharge Plan: Home/Self Care                                               Social Determinants of Health (SDOH) Interventions    Readmission Risk Interventions No flowsheet data found.

## 2021-01-10 NOTE — Progress Notes (Signed)
PROGRESS NOTE    [name]  MPN:[id number] DOB: Nov 29, 1950 DOA: 01/02/2021 PCP: Denita Lung, MD   Chief Complain: Black stools  Brief Narrative: Patient is a 70 year old African-American male with past medical history of Parkinson's disease, BPH, hypertension, hyperlipidemia, previous GI bleed who presented to the emergency department with complaints of positive black stools.  Patient was found to be anemic in presentation.  GI was consulted after the hospitalization.  He underwent EGD without finding of any source of bleeding.  Patient underwent CTA abdomen/pelvis that localized bleeding on the colon area.  IR was consulted but ablation was not successful.  Patient also complained of chest discomfort on 01/05/2021, had mildly elevated troponin for which cardiology was consulted.  Patient underwent colonoscopy without finding of any acute bleeding, diverticulosis.  Patient became hypotensive in the evening of 01/09/2021 and had to be transferred to stepdown again.  Blood pressure is stable but he developed acute confusion, most likely hospital delirium.  Neurology consulted as per request of the family.  Assessment & Plan:   Active Problems:   Acute GI bleeding   GI bleed  Acute lower GI bleeding/diverticulosis: Presented with black/maroon-colored stools.  CT abdomen/pelvis was unremarkable.  CTA localized bleeding on the ascending colon but embolization was not successful as there was no significant bleeding during the procedure.  GI consulted and following.  EGD did not show any source of bleeding.  Underwent colonoscopy today with finding of pandiverticulosis, no active bleeding, nonbleeding internal hemorrhoids.  Diverticular bleeding is the most likely cause.  Hemoglobin stable in the range of 8.8 today. If patient bleeds again, we will do a stat NM GI blood loss scan and IR reevaluation. Will recommend patient to have high-fiber diet in the future.  Acute blood loss anemia:  Suspected to be from diverticular bleed.  Transfused with 2 units of packed RBC during this hospitalization.  Continue to monitor CBC.  Transfuse if hemoglobin less than 7.  Hemoglobin in the range of 8.8 today.  No new bloody bowel movement.  hypotension/history of hypertension: .  Home antihypertensives are  on hold.  Patient was found to be orthostatic earlier during this hospitalization.  Became hypotensive on 01/09/2021, had to be started on fluids.  Currently blood pressure stable.  Will continue gentle IV fluids.  Elevated troponin/chest discomfort: Likely from supply demand ischemia from severe anemia.  Echocardiogram showed a preserved left ventricular fraction with grade 2 diastolic dysfunction.  No regional wall motion abnormality.  Continue statin.  Cardiology were following.  No plan for further work-up.  Parkinson disease/acute  encephalopathy: On Sinemet at home. There was report of intermittent confusion during this hospitalization.  Patient became acutely confused in the morning of 01/10/2021.  Most likely this is hospital-acquired delirium or secondary to anesthesia effect during the colonoscopy.  He has history of confusion/delirium secondary to anesthesia medications in the past.  As per the request of the wife, we have requested neurology consultation.  We will continue to monitor his mental status.  We will keep the room bright with day sunlight, we will attempt frequent reorientation.  Will minimize sedatives, narcotics. Started on low-dose Seroquel.  BPH: On alfuzosin at home,will stop due to orthostatic hypotension  Debility/deconditioning: Patient seen by PT/OT and recommended outpatient PT.or no follow up            DVT prophylaxis:SCD Code Status: Full Family Communication: Discussed with wife at bedside on 01/10/21 Status is: Inpatient  Remains inpatient appropriate because:Hemodynamically unstable  Dispo: The patient is from: Home              Anticipated d/c  is to: Home              Patient currently is not medically stable to d/c.   Difficult to place patient No     Consultants: GI  Procedures:EGD/colonoscopy  Antimicrobials:  Anti-infectives (From admission, onward)    None       Subjective:  Patient seen and examined the bedside this morning.  Hemodynamically stable but agitated, confused and had to be put on restraints and given Haldol.  Wife and son at the bedside.  Objective: Vitals:   01/10/21 0000 01/10/21 0400 01/10/21 0415 01/10/21 0600  BP: 124/65 (!) 177/99 (!) 151/89 139/81  Pulse: 84 (!) 138 71 63  Resp: 19 (!) 33 18 12  Temp: 97.8 F (36.6 C) 97.9 F (36.6 C)    TempSrc: Oral Axillary    SpO2: 93% 100% 97% 96%  Weight:      Height:        Intake/Output Summary (Last 24 hours) at 01/10/2021 0733 Last data filed at 01/10/2021 0600 Gross per 24 hour  Intake 2681.17 ml  Output 2350 ml  Net 331.17 ml   Filed Weights   01/02/21 1213 01/02/21 2017 01/09/21 0714  Weight: 72.6 kg 69.6 kg 69.6 kg    Examination:  General exam: Sleepy/drowsy, eyes closed Respiratory system:  no wheezes or crackles  Cardiovascular system: S1 & S2 heard, RRR.  Gastrointestinal system: Abdomen is nondistended, soft and nontender. Central nervous system: Not alert or oriented.   Extremities: No edema, no clubbing ,no cyanosis Skin: No rashes, no ulcers,no icterus     Data Reviewed: I have personally reviewed following labs and imaging studies  CBC: Recent Labs  Lab 01/04/21 0831 01/04/21 1523 01/05/21 0323 01/05/21 1140 01/05/21 1821 01/06/21 0257 01/06/21 1657 01/07/21 0245 01/07/21 1538 01/08/21 2135 01/09/21 0249 01/09/21 1505 01/09/21 1648 01/10/21 0303  WBC 9.2   < > 9.1  --  9.0 9.0 9.1 10.5  --   --   --   --   --   --   NEUTROABS 6.1  --   --   --   --   --   --   --   --   --   --   --   --   --   HGB 7.9*   < > 6.5*   < > 7.7* 7.7* 8.0* 7.8*   < > 11.0* 9.8* 9.7* 9.9* 8.8*  HCT 24.0*   < > 20.3*    < > 23.2* 23.2* 24.2* 24.0*   < > 32.7* 29.6* 28.1* 29.6* 27.1*  MCV 95.2   < > 98.1  --  91.3 90.6 92.0 92.7  --   --   --   --   --   --   PLT 221   < > 177  --  145* 164 170 164  --   --   --   --   --   --    < > = values in this interval not displayed.   Basic Metabolic Panel: Recent Labs  Lab 01/04/21 0011 01/04/21 0830 01/07/21 0245 01/09/21 0249 01/10/21 0303  NA 133* 135 138 137 138  K 3.9 4.0 4.5 3.6 3.7  CL 103 105 109 108 111  CO2 24 23 25 26 23   GLUCOSE 119* 136* 102* 89 109*  BUN 33*  35* 24* 17 15  CREATININE 1.06 1.17 0.99 0.97 1.07  CALCIUM 8.2* 8.1* 8.1* 8.2* 8.2*  MG  --   --  2.0 2.1  --   PHOS  --   --  2.9 2.7  --    GFR: Estimated Creatinine Clearance: 62.3 mL/min (by C-G formula based on SCr of 1.07 mg/dL). Liver Function Tests: Recent Labs  Lab 01/09/21 0249  AST 18  ALT 8  ALKPHOS 48  BILITOT 0.7  PROT 5.7*  ALBUMIN 3.6   No results for input(s): LIPASE, AMYLASE in the last 168 hours. No results for input(s): AMMONIA in the last 168 hours. Coagulation Profile: No results for input(s): INR, PROTIME in the last 168 hours. Cardiac Enzymes: No results for input(s): CKTOTAL, CKMB, CKMBINDEX, TROPONINI in the last 168 hours. BNP (last 3 results) No results for input(s): PROBNP in the last 8760 hours. HbA1C: No results for input(s): HGBA1C in the last 72 hours. CBG: No results for input(s): GLUCAP in the last 168 hours. Lipid Profile: No results for input(s): CHOL, HDL, LDLCALC, TRIG, CHOLHDL, LDLDIRECT in the last 72 hours.  Thyroid Function Tests: No results for input(s): TSH, T4TOTAL, FREET4, T3FREE, THYROIDAB in the last 72 hours. Anemia Panel: No results for input(s): VITAMINB12, FOLATE, FERRITIN, TIBC, IRON, RETICCTPCT in the last 72 hours. Sepsis Labs: No results for input(s): PROCALCITON, LATICACIDVEN in the last 168 hours.  Recent Results (from the past 240 hour(s))  Resp Panel by RT-PCR (Flu A&B, Covid) Nasopharyngeal Swab      Status: None   Collection Time: 01/02/21  3:45 PM   Specimen: Nasopharyngeal Swab; Nasopharyngeal(NP) swabs in vial transport medium  Result Value Ref Range Status   SARS Coronavirus 2 by RT PCR NEGATIVE NEGATIVE Final    Comment: (NOTE) SARS-CoV-2 target nucleic acids are NOT DETECTED.  The SARS-CoV-2 RNA is generally detectable in upper respiratory specimens during the acute phase of infection. The lowest concentration of SARS-CoV-2 viral copies this assay can detect is 138 copies/mL. A negative result does not preclude SARS-Cov-2 infection and should not be used as the sole basis for treatment or other patient management decisions. A negative result may occur with  improper specimen collection/handling, submission of specimen other than nasopharyngeal swab, presence of viral mutation(s) within the areas targeted by this assay, and inadequate number of viral copies(<138 copies/mL). A negative result must be combined with clinical observations, patient history, and epidemiological information. The expected result is Negative.  Fact Sheet for Patients:  EntrepreneurPulse.com.au  Fact Sheet for Healthcare Providers:  IncredibleEmployment.be  This test is no t yet approved or cleared by the [place] FDA and  has been authorized for detection and/or diagnosis of SARS-CoV-2 by FDA under an Emergency Use Authorization (EUA). This EUA will remain  in effect (meaning this test can be used) for the duration of the COVID-19 declaration under Section 564(b)(1) of the Act, 21 U.S.C.section 360bbb-3(b)(1), unless the authorization is terminated  or revoked sooner.       Influenza A by PCR NEGATIVE NEGATIVE Final   Influenza B by PCR NEGATIVE NEGATIVE Final    Comment: (NOTE) The Xpert Xpress SARS-CoV-2/FLU/RSV plus assay is intended as an aid in the diagnosis of influenza from Nasopharyngeal swab specimens and should not be used as a sole basis  for treatment. Nasal washings and aspirates are unacceptable for Xpert Xpress SARS-CoV-2/FLU/RSV testing.  Fact Sheet for Patients: EntrepreneurPulse.com.au  Fact Sheet for Healthcare Providers: IncredibleEmployment.be  This test is not yet approved or  cleared by the [place] and has been authorized for detection and/or diagnosis of SARS-CoV-2 by FDA under an Emergency Use Authorization (EUA). This EUA will remain in effect (meaning this test can be used) for the duration of the COVID-19 declaration under Section 564(b)(1) of the Act, 21 U.S.C. section 360bbb-3(b)(1), unless the authorization is terminated or revoked.  Performed at [facility], [place] [address] [place], [place] [address]   MRSA PCR Screening     Status: None   Collection Time: 01/04/21 11:52 AM   Specimen: Nasal Mucosa; Nasopharyngeal  Result Value Ref Range Status   MRSA by PCR NEGATIVE NEGATIVE Final    Comment:        The GeneXpert MRSA Assay (FDA approved for NASAL specimens only), is one component of a comprehensive MRSA colonization surveillance program. It is not intended to diagnose MRSA infection nor to guide or monitor treatment for MRSA infections. Performed at [facility], [place] [address] [place], [place] [address]          Radiology Studies: No results found.      Scheduled Meds:  sodium chloride   Intravenous Once   sodium chloride   Intravenous Once   alfuzosin  10 mg Oral Daily   atorvastatin  40 mg Oral QPM   carbidopa-levodopa  1.5 tablet Oral TID   Chlorhexidine Gluconate Cloth  6 each Topical Daily   citalopram  20 mg Oral Daily   mouth rinse  15 mL Mouth Rinse BID    morphine injection  2 mg Intravenous Once   multivitamin with minerals  1 tablet Oral Daily   pantoprazole  40 mg Oral Daily   psyllium  1 packet Oral Daily   timolol  1 drop Both Eyes Daily   trihexyphenidyl  1 mg Oral  BID WC   Continuous Infusions:  sodium chloride 125 mL/hr at 01/10/21 0600     LOS: 6 days    Time spent: 35 mins.More than 50% of that time was spent in counseling and/or coordination of care.      Shelly Coss, MD [facility] P6/17/2022, 7:33 AM

## 2021-01-10 NOTE — Progress Notes (Signed)
[facility] Gastroenterology Progress Note  [name] 70 y.o. 1951-04-18  CC: Rectal bleeding  Subjective: Patient very confused.  Per RN, no BMs today or overnight.   ROS : Review of Systems  Unable to perform ROS: Mental status change    Objective: Vital signs in last 24 hours: Vitals:   01/10/21 0844 01/10/21 0900  BP:  (!) 162/103  Pulse:  66  Resp:  13  Temp: 98.2 F (36.8 C)   SpO2:  100%    Physical Exam:  General:  Sleeping but easily aroused, confused, in restraints  Head:  Normocephalic, without obvious abnormality, atraumatic  Eyes:  Anicteric sclera, EOMs intact  Lungs:   Clear to auscultation bilaterally, respirations unlabored  Heart:  Regular rate and rhythm, S1, S2 normal  Abdomen:   Soft, non-tender, non-distended, bowel sounds active all four quadrants  Extremities: Extremities normal, atraumatic, no  edema    Lab Results: Recent Labs    01/09/21 0249 01/10/21 0303  NA 137 138  K 3.6 3.7  CL 108 111  CO2 26 23  GLUCOSE 89 109*  BUN 17 15  CREATININE 0.97 1.07  CALCIUM 8.2* 8.2*  MG 2.1  --   PHOS 2.7  --     Recent Labs    01/09/21 0249  AST 18  ALT 8  ALKPHOS 48  BILITOT 0.7  PROT 5.7*  ALBUMIN 3.6   Recent Labs    01/09/21 1648 01/10/21 0303  HGB 9.9* 8.8*  HCT 29.6* 27.1*    No results for input(s): LABPROT, INR in the last 72 hours.   Assessment: Rectal bleeding, due to diverticular bleeding.  Unable to undergo IR embolization as no bleeding site was identified.  Currently quiescent. -Colonoscopy 01/09/21: Old blood and clots in the entire examined colon, no active bleeding visualized. Diverticulosis in the sigmoid colon, in the descending colon, in the transverse colon, at the hepatic flexure and in the ascending colon. Non-bleeding internal hemorrhoids. -Hgb 8.8, improved from 7.1 after 2u pRBCs  Hospital-induced delirium  Plan: Continue supportive care. Continue to monitor H&H with transfusion as needed to  maintain Hgb >7.  If recurrent destabilizing bleeding, proceed with CT-A, followed by IR consultation if positive.  [facility] will follow.  [name] PA-C 01/10/2021, 10:06 AM  Contact #  [phone]

## 2021-01-10 NOTE — Progress Notes (Signed)
PT Cancellation Note  Patient Details Name: [name] MRN: [id number] DOB: 1950/09/21   Cancelled Treatment:    Reason Eval/Treat Not Completed: Medical issues which prohibited therapy.   Claretha Cooper 01/10/2021, 11:19 AM. [place] Pager [phone] Office [phone]

## 2021-01-10 NOTE — Progress Notes (Signed)
At initial am assessment, pt became agitated, trying to exit bed.  This RN called for assistance for pt to [facility], when transferring pt agitation increased becoming aggressive to staff.  MD contacted PRN haldol order placed.  Bilateral soft wrist restraints placed per order for pt safety, PRN medication given.  Wife called and updated on pt condition by RN.  Pt resting comfortably before RN leaving room.  RN will continue to carefully monitor and assess.

## 2021-01-10 NOTE — Telephone Encounter (Signed)
Pt's wife called stating that the pt is admitted to [facility] and the pt is getting worse instead of getting better and wife is wanting to be advised on how she can get pt to be transferred to [facility] so that Dr. [name] can treat the pt there. Please advise.

## 2021-01-10 NOTE — Consult Note (Signed)
Neurology Consultation  Reason for Consult: Acute encephalopathy  Referring Physician: Dr. Tawanna Solo  CC: Acute encephalopathy  History is obtained from: Chart review, Patient's wife at bedside. Unable to obtain history from patient due to acute encephalopathy.  HPI: [name] is a 70 y.o. male with a medical history significant for Parkinson's Disease followed by Dr. 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 Since hospitalization he has undergone an EGD and went to IR for a failed attempt at ablation. He then underwent a colonoscopy. He has developed acute confusion throughout his hospitalization with visual hallucinations and agitation requiring chemical and physical restraints. Family requested neurology evaluation for management of acute confusion and agitation.   At baseline [name] is independent. He is able to drive and work with some reported short term memory loss where "things just don't come to him as quickly as they used to" per patient's wife. He usually is conversant and oriented to person, place, time, and situation. He walks independently without assistive device and manages his own medications. He follows with Dr. [name] at [facility] for his Parkinson's Disease and has tremors of his left arm and leg greater than his right arm. Patient's wife states that he does sometimes get confused with anesthesia and is concerned about multiple procedures with increased confusion.  ROS: Unable to obtain due to acute encephalopathy.   Past Medical History:  Diagnosis Date   Allergy    Arthritis    BPH (benign prostatic hyperplasia)    Complication of anesthesia    Pt. stated he had a  reaction that ended in him requiring urinary cath placement   Diverticulosis    GERD (gastroesophageal reflux disease)    esophageal spasms   Glaucoma    Gout    Head injury, closed, with concussion    Hepatitis C    chronic - Has been treated with Harvoni   HLD (hyperlipidemia)    statin intolerant (Crestor & Simvastatin) - Taking Livalo 1mg  / week   Hypertension    Parkinson's disease ([place])    Plantar fasciitis    right   PVD (peripheral vascular disease) ([place])    With no claudication; only mild abdominal aortic atherosclerosis noted on ultrasound.   Thoracic ascending aortic aneurysm (HCC)    4.2 cm ascending TAA 09/2016 CT, 1 yr f/u rec   Ulcer    Past Surgical History:  Procedure Laterality Date   BUNIONECTOMY WITH WEIL OSTEOTOMY Right 11/02/2019   Procedure: Right Foot Lapidus, Modified McBride Bunionectomy,  Hallux Akin Osteotomy;  Surgeon: Wylene Simmer, MD;  Location: [place];  Service: Orthopedics;  Laterality: Right;   CARDIAC CATHETERIZATION  2005   30% Cx. Dr. Melvern Banker   cataract surgery Left 11/05/2014   COLONOSCOPY     COLONOSCOPY N/A 01/09/2021   Procedure: COLONOSCOPY;  Surgeon: Ronnette Juniper, MD;  Location: [facility];  Service: Gastroenterology;  Laterality: N/A;   ESOPHAGOGASTRODUODENOSCOPY N/A 05/02/2015   Procedure: ESOPHAGOGASTRODUODENOSCOPY (EGD);  Surgeon: Ronald Lobo, MD;  Location: [facility];  Service: Endoscopy;  Laterality: N/A;   ESOPHAGOGASTRODUODENOSCOPY  05/02/2015   no source of pt chest pain endoscopically evident. small hiatal hernia.   ESOPHAGOGASTRODUODENOSCOPY (EGD) WITH PROPOFOL N/A 01/02/2021   Procedure: ESOPHAGOGASTRODUODENOSCOPY (EGD) WITH  PROPOFOL;  Surgeon: Clarene Essex, MD;  Location: [facility];  Service: Endoscopy;  Laterality: N/A;   EYE SURGERY     HARDWARE REMOVAL Right 11/02/2019   Procedure: Second Metatarsal Removal of Deep Implant and Rotational Osteotomy;  Surgeon: Wylene Simmer, MD;  Location: [place];  Service: Orthopedics;  Laterality: Right;   IR ANGIOGRAM SELECTIVE EACH ADDITIONAL VESSEL  01/04/2021   IR ANGIOGRAM SELECTIVE EACH ADDITIONAL VESSEL  01/04/2021   IR ANGIOGRAM VISCERAL SELECTIVE  01/04/2021   IR US GUIDE VASC ACCESS RIGHT  01/04/2021   MEMBRANE PEEL Left 03/14/2014   Procedure: MEMBRANE PEEL; ENDOLASER;  Surgeon: Hurman Horn, MD;  Location: [place];  Service: Ophthalmology;  Laterality: Left;   NM MYOVIEW LTD  2011   Neg Ischemia or infarct.   NM MYOVIEW LTD  10/2015   LOW RISK. Small, fixed basal lateral defect - likely diaphragmatic attenuation. EF 69%   PARS PLANA VITRECTOMY Left 03/14/2014   Procedure: PARS PLANA VITRECTOMY WITH 25 GAUGE;  Surgeon: Hurman Horn, MD;  Location: [place];  Service: Ophthalmology;  Laterality: Left;   TONSILLECTOMY     TRANSTHORACIC ECHOCARDIOGRAM  2013   Normal EF. No significant Valve Disease   UPPER GASTROINTESTINAL ENDOSCOPY     WEIL OSTEOTOMY Right 09/01/2017   Procedure: RIGHT GREAT TOE CHEVRON AND WEIL OSTEOTOMY 2ND METATARSAL;  Surgeon: Newt Minion, MD;  Location: [place];  Service: Orthopedics;  Laterality: Right;   Family History  Problem Relation Age of Onset   Heart disease Mother    Cancer Paternal Grandfather    Cancer Sister    Social History:   reports that he quit smoking about 32 years ago. His smoking use included cigars. He has never used smokeless tobacco. He reports current alcohol use of about 3.0 standard drinks of alcohol per week. He reports that he does not use drugs.  Medications  Current Facility-Administered Medications:    0.9 %  sodium chloride infusion (Manually program via Guardrails IV Fluids), , Intravenous, Once, Ronnette Juniper, MD   0.9 %  sodium chloride infusion (Manually program via Guardrails IV Fluids), , Intravenous, Once, Ronnette Juniper, MD   0.9 %  sodium chloride infusion, , Intravenous, Continuous, Adhikari, Amrit, MD, Last Rate: 100 mL/hr at 01/10/21 1228, New Bag at 01/10/21  1228   acetaminophen (TYLENOL) tablet 500 mg, 500 mg, Oral, Q6H PRN, Ronnette Juniper, MD, 500 mg at 01/08/21 1815   atorvastatin (LIPITOR) tablet 40 mg, 40 mg, Oral, QPM, Ronnette Juniper, MD, 40 mg at 01/09/21 1755   carbidopa-levodopa (SINEMET IR) 25-100 MG per tablet immediate release 1.5 tablet, 1.5 tablet, Oral, TID, Ronnette Juniper, MD, 1.5 tablet at 01/09/21 2100   Chlorhexidine Gluconate Cloth 2 % PADS 6 each, 6 each, Topical, Daily, Ronnette Juniper, MD, 6 each at 01/09/21 0953   citalopram (CELEXA) tablet 20 mg, 20 mg, Oral, Daily, Ronnette Juniper, MD, 20 mg at 01/09/21 0947   docusate sodium (COLACE) capsule 100 mg, 100 mg, Oral, Daily PRN, Ronnette Juniper, MD   haloperidol lactate (HALDOL) injection 2 mg, 2 mg, Intravenous, Q6H PRN, Shelly Coss, MD   MEDLINE mouth rinse, 15 mL, Mouth Rinse, BID, Ronnette Juniper, MD, 15 mL at 01/09/21 2101   multivitamin with minerals tablet 1 tablet, 1 tablet, Oral, Daily, Ronnette Juniper, MD, 1 tablet at 01/09/21 0947   nitroGLYCERIN (NITROSTAT) SL tablet 0.4 mg, 0.4 mg, Sublingual, Q5 min PRN, Ronnette Juniper, MD, 0.4 mg at 01/05/21 0653   pantoprazole (  PROTONIX) EC tablet 40 mg, 40 mg, Oral, Daily, Adhikari, Amrit, MD   polyethylene glycol (MIRALAX / GLYCOLAX) packet 17 g, 17 g, Oral, Daily PRN, Ronnette Juniper, MD   psyllium (HYDROCIL/METAMUCIL) 1 packet, 1 packet, Oral, Daily, Ronnette Juniper, MD   QUEtiapine (SEROQUEL) tablet 25 mg, 25 mg, Oral, QHS, Adhikari, Amrit, MD   sodium chloride (OCEAN) 0.65 % nasal spray 1 spray, 1 spray, Each Nare, PRN, Ronnette Juniper, MD, 1 spray at 01/06/21 1849   timolol (TIMOPTIC) 0.5 % ophthalmic solution 1 drop, 1 drop, Both Eyes, Daily, Ronnette Juniper, MD, 1 drop at 01/09/21 0953   trihexyphenidyl (ARTANE) tablet 1 mg, 1 mg, Oral, BID WC, Ronnette Juniper, MD, 1 mg at 01/09/21 1755  Exam: Current vital signs: BP (!) 162/103   Pulse 66   Temp 98.2 F (36.8 C) (Oral)   Resp 13   Ht 5\' 10"  (1.778 m)   Wt 69.6 kg   SpO2 100%   BMI 22.02 kg/m  Vital signs  in last 24 hours: Temp:  [97.4 F (36.3 C)-98.2 F (36.8 C)] 98.2 F (36.8 C) (06/17 0844) Pulse Rate:  [49-138] 66 (06/17 0900) Resp:  [12-33] 13 (06/17 0900) BP: (76-177)/(47-103) 162/103 (06/17 0900) SpO2:  [93 %-100 %] 100 % (06/17 0900)  GENERAL: Restless extremity movements while laying in the bed, in no acute distress Head: Normocephalic and atraumatic EENT: Opaque right cornea, dry mucous membranes, intermittent twitching of his mouth  LUNGS: Normal respiratory effort. Non-labored breathing CV: Regular rate on telemetry, extremities warm, well perfused, and without edema ABDOMEN: Soft, non-tender Ext: without obvious deformity, warm  NEURO:  Mental Status: Asleep initially with restless extremity movements. Opens eyes briefly to voice but speaks with his eyes closed. Follows simple commands when repeatedly stimulated. He often drifts off to sleep during conversation requiring further stimulation. He is able to correctly state his name. He incorrectly states that the year is 2020 and then 2023. When asked the month he states June, July, December, and then settles incorrectly on July. When asked his age, he provides the age of his family members and states "wait, no that's my dad's age" but is unable to state his own age and incorrectly reports that he is 70 years old. When asked where he is he states "[place] beach". He is unable to provide a clear or coherent history of present illness.  Poor concentration is noted.  Speech is mildly dysarthric but is at baseline per wife at bedside. Naming is partially intact with correctly naming 3/4 objects but requires repeated instruction to open his eyes to view the object and requires hints with some objects. Repetition partially intact with consistent omission of 1-2 words per phrase.  No neglect is noted.  Cranial Nerves:  II: Right pupil is irregular, 4 mm, and nonreactive 2/2 a history of right retinal detachment and increased IOP. Left pupil  is 2 mm and briskly reactive to light.   III, IV, VI: EOMI with repeated instruction. Resists examiner eye opening.  V: Sensation is intact to light touch and symmetrical to face.  VII: Face is symmetric resting and smiling.  VIII: Hearing is intact to loud voice though he is hard of hearing at baseline IX, X: Palate elevation is symmetric. Phonation normal.  XI: Normal sternocleidomastoid and trapezius muscle strength XII: Tongue protrudes midline Motor: Upper extremity strength assessment is partially limited due to physical restraints and patient agitation / attempts at ambulation. Grip strength and biceps 5/5 strength.  Bilateral lower extremity assessment  partially limited due to patient agitation and trouble following commands despite repeat instruction. Tone is increased throughout with impaired relaxation. Bilateral upper extremities with cogwheel rigidity. Tremor noted throughout with left upper and lower extremity tremor > right.  Able to sustain antigravity movement in all extremities with constant coaching.  No asymmetry in extremity strength noted on assessment.  Sensation: Intact to light touch bilaterally in all four extremities.  Coordination: Unable to assess due to patient trouble with following complex commands and physical restraints DTRs: 2+ and symmetric biceps and brachioradialis. Unable to assess patellae due to impaired relaxation.  Gait: deferred  Labs I have reviewed labs in epic and the results pertinent to this consultation are: CBC    Component Value Date/Time   WBC 10.5 01/07/2021 0245   RBC 2.59 (L) 01/07/2021 0245   HGB 8.8 (L) 01/10/2021 0303   HGB 14.1 09/10/2020 1058   HCT 27.1 (L) 01/10/2021 0303   HCT 41.2 09/10/2020 1058   PLT 164 01/07/2021 0245   PLT 243 09/10/2020 1058   MCV 92.7 01/07/2021 0245   MCV 92 09/10/2020 1058   MCH 30.1 01/07/2021 0245   MCHC 32.5 01/07/2021 0245   RDW 15.0 01/07/2021 0245   RDW 11.9 09/10/2020 1058    LYMPHSABS 1.6 01/04/2021 0831   LYMPHSABS 1.4 09/10/2020 1058   MONOABS 1.3 (H) 01/04/2021 0831   EOSABS 0.1 01/04/2021 0831   EOSABS 0.1 09/10/2020 1058   BASOSABS 0.0 01/04/2021 0831   BASOSABS 0.1 09/10/2020 1058   CMP     Component Value Date/Time   NA 138 01/10/2021 0303   NA 137 09/10/2020 1058   K 3.7 01/10/2021 0303   CL 111 01/10/2021 0303   CO2 23 01/10/2021 0303   GLUCOSE 109 (H) 01/10/2021 0303   BUN 15 01/10/2021 0303   BUN 22 09/10/2020 1058   CREATININE 1.07 01/10/2021 0303   CREATININE 1.06 06/29/2014 0920   CALCIUM 8.2 (L) 01/10/2021 0303   PROT 5.7 (L) 01/09/2021 0249   PROT 7.4 09/10/2020 1058   ALBUMIN 3.6 01/09/2021 0249   ALBUMIN 4.8 09/10/2020 1058   AST 18 01/09/2021 0249   ALT 8 01/09/2021 0249   ALKPHOS 48 01/09/2021 0249   BILITOT 0.7 01/09/2021 0249   BILITOT 0.5 09/10/2020 1058   GFRNONAA >60 01/10/2021 0303   GFRAA 68 09/10/2020 1058   Lipid Panel     Component Value Date/Time   CHOL 76 01/07/2021 0245   CHOL 175 09/10/2020 1058   TRIG 36 01/07/2021 0245   HDL 35 (L) 01/07/2021 0245   HDL 63 09/10/2020 1058   CHOLHDL 2.2 01/07/2021 0245   VLDL 7 01/07/2021 0245   LDLCALC 34 01/07/2021 0245   LDLCALC 104 (H) 09/10/2020 1058   Ammonia 6/17: 20 No results found for: VITAMINB12 Lab Results  Component Value Date   TSH 1.950 09/28/2016   Assessment: 70 year old male with PMHx of PD, HTN, HLD, PVD, GIB, and chronic hep C who initially presented to [facility] for evaluation of GIB who was found to be anemic and hypotensive. He has had a prolonged hospital stay s/p EGD, failed IR ablation attempt, and colonoscopy with recent development of altered mental status with hallucinations requiring physical and chemical restraints 2/2 agitation.  - Examination reveals patient who is oriented to self only with restless extremity movements, increased tremor throughout, and poor attention though no focal weakness is noted. Wife states that patient's mental  status waxes and wanes with some clearing when speaking to family  members.  - On medication review, patient is on Seroquel 25 mg qhs for agitation but received haloperidol this morning for ongoing agitation.  - Patient remains on Artane and Sinamet for PD / tremors - The patient's confusion, inattention, and waxing / waning mental status is most suggestive of acute encephalopathy 2/2 acute delirium, perhaps in the setting of infection, polypharmacy (haldol, anesthesia), hospitalization, or disrupted sleep cycle in an unfamiliar environment. Will complete a delirium work up. Alternative explanations for symptoms could include subclinical seizures or stroke which are less likely given this presentation and without history of seizures or focal neurologic deficits. No signs of meningitis / encephalitis in vital signs, exam, or imaging without leukocytosis, nuchal rigidity, or elevated temperature. Alternatively, Parkinson's dementia may be considered, though unlikely, due to acute onset during prolonged hospitalization.   Impression: Acute encephalopathy Acute delirium with hallucinations  Parkinson's Disease   Recommendations:  - Continue Sinemet 25/100 mg 1.5 tablets TID  - Obtain UA, serum Vitamin B12, Thiamine, and TSH levels - Try to minimize deliriogenic medications as much as possible (J Am Geriatr Soc. 2012 Apr;60(4):616-31): benzodiazepines, anticholinergics, diphenhydramine, antihistamines, narcotics, Ambien/Lunesta/Sonata etc. - Try to avoid further Haldol administration, use low dose Seroquel 12.5 to 25 mg nightly PRN and monitor QTc to reduce aggressive behavior, uptitrating slowly as needed  - Please give high dose IV thiamine (400 mg q8 IV x 3 days followed by 100 mg q8h IV while hospitalized, discharge on 100 mg PO daily) - OOB to chair during the day, reorient frequently. Lights off at night. No caffeinated beverages.    Pt seen by NP/Neuro and later by MD. Note/plan to be edited by  MD as needed.  [name], AGAC-NP [facility] Pager: [phone]  I have seen and examined the patient. I have formulated the assessment and recommendations. 70 year old male with delirium during hospital admission. Most likely component of DDx is hospital delirium with possible underlying incipient Parkinson's dementia as a contributing factor. Other DDx as above. Recommendations include limiting sedating medications and OOB to chair qd with frequent reorienting.  Electronically signed: Dr. Kerney Maxwell

## 2021-01-11 DIAGNOSIS — K922 Gastrointestinal hemorrhage, unspecified: Secondary | ICD-10-CM | POA: Diagnosis not present

## 2021-01-11 LAB — VITAMIN B12: Vitamin B-12: 1268 pg/mL — ABNORMAL HIGH (ref 180–914)

## 2021-01-11 LAB — BASIC METABOLIC PANEL
Anion gap: 4 — ABNORMAL LOW (ref 5–15)
BUN: 12 mg/dL (ref 8–23)
CO2: 25 mmol/L (ref 22–32)
Calcium: 8 mg/dL — ABNORMAL LOW (ref 8.9–10.3)
Chloride: 109 mmol/L (ref 98–111)
Creatinine, Ser: 1.06 mg/dL (ref 0.61–1.24)
GFR, Estimated: >60 mL/min (ref >60)
Glucose, Bld: 88 mg/dL (ref 70–99)
Potassium: 3.6 mmol/L (ref 3.5–5.1)
Sodium: 138 mmol/L (ref 135–145)

## 2021-01-11 LAB — HEMOGLOBIN AND HEMATOCRIT, BLOOD
HCT: 31.5 % — ABNORMAL LOW (ref 39.0–52.0)
HCT: 24.2 % — ABNORMAL LOW (ref 39.0–52.0)
Hemoglobin: 10.5 g/dL — ABNORMAL LOW (ref 13.0–17.0)
Hemoglobin: 7.8 g/dL — ABNORMAL LOW (ref 13.0–17.0)

## 2021-01-11 LAB — TSH: TSH: 3.131 u[IU]/mL (ref 0.350–4.500)

## 2021-01-11 MED ORDER — THIAMINE HCL 100 MG/ML IJ SOLN
500.0000 mg | Freq: Three times a day (TID) | INTRAMUSCULAR | Status: DC
  Filled 2021-01-11: qty 5

## 2021-01-11 MED ORDER — IRBESARTAN 75 MG PO TABS
75.0000 mg | ORAL_TABLET | Freq: Every morning | ORAL | Status: DC
  Administered 2021-01-11: 75 mg via ORAL
  Filled 2021-01-11 (×2): qty 1

## 2021-01-11 MED ORDER — VERAPAMIL HCL ER 120 MG PO TBCR
120.0000 mg | EXTENDED_RELEASE_TABLET | Freq: Every day | ORAL | Status: DC
  Administered 2021-01-11: 120 mg via ORAL
  Filled 2021-01-11 (×2): qty 1

## 2021-01-11 NOTE — Progress Notes (Signed)
Subjective: No further rectal bleeding reported. Patient more oriented today.  Objective: Vital signs in last 24 hours: Temp:  [97.4 F (36.3 C)-98.2 F (36.8 C)] 98.2 F (36.8 C) (06/18 1100) Pulse Rate:  [38-132] 79 (06/18 1300) Resp:  [11-20] 18 (06/18 1300) BP: (103-199)/(61-103) 103/64 (06/18 1200) SpO2:  [94 %-100 %] 100 % (06/18 1300) Weight change:  Last BM Date: 01/11/21  PE: Sitting up on bed, ready to walk around with a walker GENERAL: Not in distress  ABDOMEN: Non distended   Lab Results: Results for orders placed or performed during the hospital encounter of 01/02/21 (from the past 48 hour(s))  Hemoglobin and hematocrit, blood     Status: Abnormal   Collection Time: 01/09/21  3:05 PM  Result Value Ref Range   Hemoglobin 9.7 (L) 13.0 - 17.0 g/dL   HCT 28.1 (L) 39.0 - 52.0 %    Comment: Performed at [facility], [place] [address] [place], [place] [address]  Hemoglobin and hematocrit, blood     Status: Abnormal   Collection Time: 01/09/21  4:48 PM  Result Value Ref Range   Hemoglobin 9.9 (L) 13.0 - 17.0 g/dL   HCT 29.6 (L) 39.0 - 52.0 %    Comment: Performed at [facility], [place] [address], [place], [place] [address]  Hemoglobin and hematocrit, blood     Status: Abnormal   Collection Time: 01/10/21  3:03 AM  Result Value Ref Range   Hemoglobin 8.8 (L) 13.0 - 17.0 g/dL   HCT 27.1 (L) 39.0 - 52.0 %    Comment: Performed at [facility], [place] [address] [place], [place] [address]  Basic metabolic panel     Status: Abnormal   Collection Time: 01/10/21  3:03 AM  Result Value Ref Range   Sodium 138 135 - 145 mmol/L   Potassium 3.7 3.5 - 5.1 mmol/L   Chloride 111 98 - 111 mmol/L   CO2 23 22 - 32 mmol/L   Glucose, Bld 109 (H) 70 - 99 mg/dL    Comment: Glucose reference range applies only to samples taken after fasting for at least 8 hours.   BUN 15 8 - 23 mg/dL   Creatinine, Ser 1.07 0.61 - 1.24 mg/dL    Calcium 8.2 (L) 8.9 - 10.3 mg/dL   GFR, Estimated >60 >60 mL/min    Comment: (NOTE) Calculated using the CKD-EPI Creatinine Equation (2021)    Anion gap 4 (L) 5 - 15    Comment: Performed at [facility], [place] [address], [place], [place] [address]  Ammonia     Status: None   Collection Time: 01/10/21 11:31 AM  Result Value Ref Range   Ammonia 20 9 - 35 umol/L    Comment: Performed at [facility], [place] [address], [place], [place] [address]  Hemoglobin and hematocrit, blood     Status: Abnormal   Collection Time: 01/10/21  4:45 PM  Result Value Ref Range   Hemoglobin 8.6 (L) 13.0 - 17.0 g/dL   HCT 25.8 (L) 39.0 - 52.0 %    Comment: Performed at [facility], [place] [address] [place], [place] [address]  Hemoglobin and hematocrit, blood     Status: Abnormal   Collection Time: 01/11/21  2:59 AM  Result Value Ref Range   Hemoglobin 10.5 (L) 13.0 - 17.0 g/dL   HCT 31.5 (L) 39.0 - 52.0 %    Comment: Performed at [facility], [address] [place], [place] [address]  Basic metabolic panel     Status: Abnormal   Collection Time: 01/11/21  2:59 AM  Result Value Ref Range   Sodium 138 135 - 145 mmol/L   Potassium 3.6 3.5 - 5.1 mmol/L   Chloride 109 98 - 111 mmol/L   CO2 25 22 - 32 mmol/L   Glucose, Bld 88 70 - 99 mg/dL    Comment: Glucose reference range applies only to samples taken after fasting for at least 8 hours.   BUN 12 8 - 23 mg/dL   Creatinine, Ser 1.06 0.61 - 1.24 mg/dL   Calcium 8.0 (L) 8.9 - 10.3 mg/dL   GFR, Estimated >60 >60 mL/min    Comment: (NOTE) Calculated using the CKD-EPI Creatinine Equation (2021)    Anion gap 4 (L) 5 - 15    Comment: Performed at [facility], [place] [address], [place], [place] [address]    Studies/Results: No results found.  Medications: I have reviewed the patient's current medications.  Assessment: Lower GI bleeding, likely diverticular  related, colonoscopy showed old blood throughout colon, pandiverticulosis but no active bleeding. Hemoglobin stable, increased from 8.6-10.5 without transfusion, no further rectal bleeding  Plan: On regular diet and Metamucil once every day. Discussed about risk of recurrent diverticular bleeding, with the patient and his family at bedside. Recommended high-fiber diet as an outpatient, and daily use of bulk forming agent such as Metamucil/Benefiber/psyllium husk. Will sign off from GI standpoint, please recall if needed.  Ronnette Juniper, MD 01/11/2021, 1:50 PM

## 2021-01-11 NOTE — Progress Notes (Signed)
PROGRESS NOTE    [name]  GUR:[id number] DOB: Dec 05, 1950 DOA: 01/02/2021 PCP: Denita Lung, MD   Chief Complain: Black stools  Brief Narrative: Patient is a 70 year old African-American male with past medical history of Parkinson's disease, BPH, hypertension, hyperlipidemia, previous GI bleed who presented to the emergency department with complaints of positive black stools.  Patient was found to be anemic in presentation.  GI was consulted after the hospitalization.  He underwent EGD without finding of any source of bleeding.  Patient underwent CTA abdomen/pelvis that localized bleeding on the colon area.  IR was consulted but ablation was not successful.  Patient also complained of chest discomfort on 01/05/2021, had mildly elevated troponin for which cardiology was consulted.  Patient underwent colonoscopy without finding of any acute bleeding, diverticulosis.  Patient became hypotensive in the evening of 01/09/2021 and had to be transferred to stepdown again.  Blood pressure is stable but he developed acute confusion, most likely hospital delirium.  Neurology consulted as per request of the family.  Mental status has  improved today and he is alert and oriented.  Assessment & Plan:   Active Problems:   Acute GI bleeding   GI bleed  Acute lower GI bleeding/diverticulosis: Presented with black/maroon-colored stools.  CT abdomen/pelvis was unremarkable.  CTA localized bleeding on the ascending colon but embolization was not successful as there was no significant bleeding during the procedure.  GI consulted and following.  EGD did not show any source of bleeding.  Underwent colonoscopy today with finding of pandiverticulosis, no active bleeding, nonbleeding internal hemorrhoids.  Diverticular bleeding is the most likely cause.  Hemoglobin stable in the range of 10 today. If patient bleeds again, we will do a stat NM GI blood loss scan and IR reevaluation. Will recommend patient to have  high-fiber diet in the future.  Acute blood loss anemia: Suspected to be from diverticular bleed.  Transfused with 2 units of packed RBC during this hospitalization.  Continue to monitor CBC.  Transfuse if hemoglobin less than 7.  Hemoglobin in the range of 10 today.  No new bloody bowel movement.  hypotension/history of hypertension: .  Home antihypertensives are  on hold.  Patient was found to be orthostatic earlier during this hospitalization.  Became hypotensive on 01/09/2021, had to be started on fluids.  Currently blood pressure on the higher range, will restart antihypertensives  Elevated troponin/chest discomfort: Likely from supply demand ischemia from severe anemia.  Echocardiogram showed a preserved left ventricular fraction with grade 2 diastolic dysfunction.  No regional wall motion abnormality.  Continue statin.  Cardiology were following.  No plan for further work-up.  Parkinson disease/acute  encephalopathy: On Sinemet at home. There was report of intermittent confusion during this hospitalization.  Patient became acutely confused in the morning of 01/10/2021.  Most likely this is hospital-acquired delirium or secondary to anesthesia effect during the colonoscopy.  He has history of confusion/delirium secondary to anesthesia medications in the past.  As per the request of the wife, we have requested neurology consultation.  We will continue to monitor his mental status.  We will keep the room bright with day sunlight, we will attempt frequent reorientation.  Will minimize sedatives, narcotics. Started on low-dose Seroquel.  Neurology recommended to obtain UA, vitamin B12 level, thiamine, TSH level and also started on high-dose thiamine.  Patient mental status has already improved and he is currently at his baseline.  Will not continue thiamine: No history of alcohol abuse  BPH: On alfuzosin  at home,will stop due to orthostatic hypotension  Debility/deconditioning: Patient seen by PT/OT  and recommended outpatient PT.or no follow up            DVT prophylaxis:SCD Code Status: Full Family Communication: Discussed with wife at bedside on 01/10/21 Status is: Inpatient  Remains inpatient appropriate because:Hemodynamically unstable  Dispo: The patient is from: Home              Anticipated d/c is to: Home              Patient currently is not medically stable to d/c.   Difficult to place patient No     Consultants: GI  Procedures:EGD/colonoscopy  Antimicrobials:  Anti-infectives (From admission, onward)    None       Subjective:  Patient seen and examined the bedside this morning.  Hemodynamically stable.  Blood pressure was better when I evaluated him.  He is completely alert and oriented today.  Denies any complaints.  Objective: Vitals:   01/11/21 0400 01/11/21 0500 01/11/21 0600 01/11/21 0700  BP: (!) 181/100 (!) 199/92 (!) 174/102 (!) 169/97  Pulse: 65 (!) 56 (!) 46 65  Resp: 15 19 14 17   Temp: 97.9 F (36.6 C)     TempSrc: Oral     SpO2: 99%   100%  Weight:      Height:        Intake/Output Summary (Last 24 hours) at 01/11/2021 0735 Last data filed at 01/11/2021 0700 Gross per 24 hour  Intake 1978.11 ml  Output 3550 ml  Net -1571.89 ml   Filed Weights   01/02/21 1213 01/02/21 2017 01/09/21 0714  Weight: 72.6 kg 69.6 kg 69.6 kg    Examination:  General exam: Overall comfortable, not in distress HEENT: PERRL Respiratory system:  no wheezes or crackles  Cardiovascular system: S1 & S2 heard, RRR.  Gastrointestinal system: Abdomen is nondistended, soft and nontender. Central nervous system: Alert and oriented Extremities: No edema, no clubbing ,no cyanosis Skin: No rashes, no ulcers,no icterus     Data Reviewed: I have personally reviewed following labs and imaging studies  CBC: Recent Labs  Lab 01/04/21 0831 01/04/21 1523 01/05/21 0323 01/05/21 1140 01/05/21 1821 01/06/21 0257 01/06/21 1657 01/07/21 0245  01/07/21 1538 01/09/21 1505 01/09/21 1648 01/10/21 0303 01/10/21 1645 01/11/21 0259  WBC 9.2   < > 9.1  --  9.0 9.0 9.1 10.5  --   --   --   --   --   --   NEUTROABS 6.1  --   --   --   --   --   --   --   --   --   --   --   --   --   HGB 7.9*   < > 6.5*   < > 7.7* 7.7* 8.0* 7.8*   < > 9.7* 9.9* 8.8* 8.6* 10.5*  HCT 24.0*   < > 20.3*   < > 23.2* 23.2* 24.2* 24.0*   < > 28.1* 29.6* 27.1* 25.8* 31.5*  MCV 95.2   < > 98.1  --  91.3 90.6 92.0 92.7  --   --   --   --   --   --   PLT 221   < > 177  --  145* 164 170 164  --   --   --   --   --   --    < > = values in this interval not displayed.  Basic Metabolic Panel: Recent Labs  Lab 01/04/21 0830 01/07/21 0245 01/09/21 0249 01/10/21 0303 01/11/21 0259  NA 135 138 137 138 138  K 4.0 4.5 3.6 3.7 3.6  CL 105 109 108 111 109  CO2 23 25 26 23 25   GLUCOSE 136* 102* 89 109* 88  BUN 35* 24* 17 15 12   CREATININE 1.17 0.99 0.97 1.07 1.06  CALCIUM 8.1* 8.1* 8.2* 8.2* 8.0*  MG  --  2.0 2.1  --   --   PHOS  --  2.9 2.7  --   --    GFR: Estimated Creatinine Clearance: 62.9 mL/min (by C-G formula based on SCr of 1.06 mg/dL). Liver Function Tests: Recent Labs  Lab 01/09/21 0249  AST 18  ALT 8  ALKPHOS 48  BILITOT 0.7  PROT 5.7*  ALBUMIN 3.6   No results for input(s): LIPASE, AMYLASE in the last 168 hours. Recent Labs  Lab 01/10/21 1131  AMMONIA 20   Coagulation Profile: No results for input(s): INR, PROTIME in the last 168 hours. Cardiac Enzymes: No results for input(s): CKTOTAL, CKMB, CKMBINDEX, TROPONINI in the last 168 hours. BNP (last 3 results) No results for input(s): PROBNP in the last 8760 hours. HbA1C: No results for input(s): HGBA1C in the last 72 hours. CBG: No results for input(s): GLUCAP in the last 168 hours. Lipid Profile: No results for input(s): CHOL, HDL, LDLCALC, TRIG, CHOLHDL, LDLDIRECT in the last 72 hours.  Thyroid Function Tests: No results for input(s): TSH, T4TOTAL, FREET4, T3FREE, THYROIDAB  in the last 72 hours. Anemia Panel: No results for input(s): VITAMINB12, FOLATE, FERRITIN, TIBC, IRON, RETICCTPCT in the last 72 hours. Sepsis Labs: No results for input(s): PROCALCITON, LATICACIDVEN in the last 168 hours.  Recent Results (from the past 240 hour(s))  Resp Panel by RT-PCR (Flu A&B, Covid) Nasopharyngeal Swab     Status: None   Collection Time: 01/02/21  3:45 PM   Specimen: Nasopharyngeal Swab; Nasopharyngeal(NP) swabs in vial transport medium  Result Value Ref Range Status   SARS Coronavirus 2 by RT PCR NEGATIVE NEGATIVE Final    Comment: (NOTE) SARS-CoV-2 target nucleic acids are NOT DETECTED.  The SARS-CoV-2 RNA is generally detectable in upper respiratory specimens during the acute phase of infection. The lowest concentration of SARS-CoV-2 viral copies this assay can detect is 138 copies/mL. A negative result does not preclude SARS-Cov-2 infection and should not be used as the sole basis for treatment or other patient management decisions. A negative result may occur with  improper specimen collection/handling, submission of specimen other than nasopharyngeal swab, presence of viral mutation(s) within the areas targeted by this assay, and inadequate number of viral copies(<138 copies/mL). A negative result must be combined with clinical observations, patient history, and epidemiological information. The expected result is Negative.  Fact Sheet for Patients:  EntrepreneurPulse.com.au  Fact Sheet for Healthcare Providers:  IncredibleEmployment.be  This test is no t yet approved or cleared by the [place] FDA and  has been authorized for detection and/or diagnosis of SARS-CoV-2 by FDA under an Emergency Use Authorization (EUA). This EUA will remain  in effect (meaning this test can be used) for the duration of the COVID-19 declaration under Section 564(b)(1) of the Act, 21 U.S.C.section 360bbb-3(b)(1), unless the  authorization is terminated  or revoked sooner.       Influenza A by PCR NEGATIVE NEGATIVE Final   Influenza B by PCR NEGATIVE NEGATIVE Final    Comment: (NOTE) The Xpert Xpress SARS-CoV-2/FLU/RSV plus assay is  intended as an aid in the diagnosis of influenza from Nasopharyngeal swab specimens and should not be used as a sole basis for treatment. Nasal washings and aspirates are unacceptable for Xpert Xpress SARS-CoV-2/FLU/RSV testing.  Fact Sheet for Patients: EntrepreneurPulse.com.au  Fact Sheet for Healthcare Providers: IncredibleEmployment.be  This test is not yet approved or cleared by the [place] FDA and has been authorized for detection and/or diagnosis of SARS-CoV-2 by FDA under an Emergency Use Authorization (EUA). This EUA will remain in effect (meaning this test can be used) for the duration of the COVID-19 declaration under Section 564(b)(1) of the Act, 21 U.S.C. section 360bbb-3(b)(1), unless the authorization is terminated or revoked.  Performed at [facility], [place] [address] [place], [place] [address]   MRSA PCR Screening     Status: None   Collection Time: 01/04/21 11:52 AM   Specimen: Nasal Mucosa; Nasopharyngeal  Result Value Ref Range Status   MRSA by PCR NEGATIVE NEGATIVE Final    Comment:        The GeneXpert MRSA Assay (FDA approved for NASAL specimens only), is one component of a comprehensive MRSA colonization surveillance program. It is not intended to diagnose MRSA infection nor to guide or monitor treatment for MRSA infections. Performed at [facility], [place] [address], [place], [place] [address]          Radiology Studies: No results found.      Scheduled Meds:  sodium chloride   Intravenous Once   sodium chloride   Intravenous Once   atorvastatin  40 mg Oral QPM   carbidopa-levodopa  1.5 tablet Oral TID   Chlorhexidine Gluconate Cloth  6 each  Topical Daily   citalopram  20 mg Oral Daily   mouth rinse  15 mL Mouth Rinse BID   multivitamin with minerals  1 tablet Oral Daily   pantoprazole  40 mg Oral Daily   psyllium  1 packet Oral Daily   QUEtiapine  25 mg Oral QHS   timolol  1 drop Both Eyes Daily   trihexyphenidyl  1 mg Oral BID WC   Continuous Infusions:     LOS: 7 days    Time spent: 35 mins.More than 50% of that time was spent in counseling and/or coordination of care.      Shelly Coss, MD [facility] P6/18/2022, 7:35 AM

## 2021-01-12 ENCOUNTER — Inpatient Hospital Stay (HOSPITAL_COMMUNITY): Payer: Medicare Other

## 2021-01-12 DIAGNOSIS — K922 Gastrointestinal hemorrhage, unspecified: Secondary | ICD-10-CM | POA: Diagnosis not present

## 2021-01-12 LAB — PREPARE RBC (CROSSMATCH)

## 2021-01-12 LAB — HEMOGLOBIN AND HEMATOCRIT, BLOOD
HCT: 20.9 % — ABNORMAL LOW (ref 39.0–52.0)
HCT: 22.4 % — ABNORMAL LOW (ref 39.0–52.0)
HCT: 24.9 % — ABNORMAL LOW (ref 39.0–52.0)
HCT: 27.7 % — ABNORMAL LOW (ref 39.0–52.0)
Hemoglobin: 6.7 g/dL — CL (ref 13.0–17.0)
Hemoglobin: 7.3 g/dL — ABNORMAL LOW (ref 13.0–17.0)
Hemoglobin: 8.3 g/dL — ABNORMAL LOW (ref 13.0–17.0)
Hemoglobin: 9.4 g/dL — ABNORMAL LOW (ref 13.0–17.0)

## 2021-01-12 MED ORDER — SODIUM CHLORIDE 0.9 % IV SOLN: INTRAVENOUS | Status: DC

## 2021-01-12 MED ORDER — SODIUM CHLORIDE 0.9% IV SOLUTION: Freq: Once | INTRAVENOUS | Status: DC

## 2021-01-12 MED ORDER — TECHNETIUM TC 99M-LABELED RED BLOOD CELLS IV KIT
20.6000 | PACK | Freq: Once | INTRAVENOUS | Status: AC
  Administered 2021-01-12: 20.6 via INTRAVENOUS

## 2021-01-12 NOTE — Progress Notes (Signed)
NOTED HGB VALUE OF 6.7. PT WITH NO S/S OF ACTIVE BLEEDING. VSS BM WERE SOFT BROWN. ON CALL MADE AWARE OF VALUES VIA IN HOUSE PAGE

## 2021-01-12 NOTE — Progress Notes (Signed)
Subjective: The patient noted to have a drop in hemoglobin to 6.7 today morning. As per patient and his nurse, he has not had frank rectal bleeding. Stool yesterday looked dark.   Objective: Vital signs in last 24 hours: Temp:  [97.7 F (36.5 C)-99.2 F (37.3 C)] 98.6 F (37 C) (06/19 0400) Pulse Rate:  [65-132] 70 (06/18 1800) Resp:  [11-22] 17 (06/18 1800) BP: (89-199)/(46-92) 89/46 (06/18 2230) SpO2:  [94 %-100 %] 100 % (06/18 1800) Weight change:  Last BM Date: 01/12/21  PE: Prominent pallor GENERAL: Alert, oriented x3  ABDOMEN: Non distended EXTREMITIES: No edema  Lab Results: Results for orders placed or performed during the hospital encounter of 01/02/21 (from the past 48 hour(s))  Ammonia     Status: None   Collection Time: 01/10/21 11:31 AM  Result Value Ref Range   Ammonia 20 9 - 35 umol/L    Comment: Performed at [facility], [place] [address], [place], [place] [address]  Hemoglobin and hematocrit, blood     Status: Abnormal   Collection Time: 01/10/21  4:45 PM  Result Value Ref Range   Hemoglobin 8.6 (L) 13.0 - 17.0 g/dL   HCT 25.8 (L) 39.0 - 52.0 %    Comment: Performed at [facility], [place] [address] [place], [place] [address]  Hemoglobin and hematocrit, blood     Status: Abnormal   Collection Time: 01/11/21  2:59 AM  Result Value Ref Range   Hemoglobin 10.5 (L) 13.0 - 17.0 g/dL   HCT 31.5 (L) 39.0 - 52.0 %    Comment: Performed at [facility], [place] [address], [place], [place] [address]  Basic metabolic panel     Status: Abnormal   Collection Time: 01/11/21  2:59 AM  Result Value Ref Range   Sodium 138 135 - 145 mmol/L   Potassium 3.6 3.5 - 5.1 mmol/L   Chloride 109 98 - 111 mmol/L   CO2 25 22 - 32 mmol/L   Glucose, Bld 88 70 - 99 mg/dL    Comment: Glucose reference range applies only to samples taken after fasting for at least 8 hours.   BUN 12 8 - 23 mg/dL   Creatinine, Ser 1.06 0.61 - 1.24  mg/dL   Calcium 8.0 (L) 8.9 - 10.3 mg/dL   GFR, Estimated >60 >60 mL/min    Comment: (NOTE) Calculated using the CKD-EPI Creatinine Equation (2021)    Anion gap 4 (L) 5 - 15    Comment: Performed at [facility], [place] [address], [place], [place] [address]  Hemoglobin and hematocrit, blood     Status: Abnormal   Collection Time: 01/11/21  4:55 PM  Result Value Ref Range   Hemoglobin 7.8 (L) 13.0 - 17.0 g/dL    Comment: REPEATED TO VERIFY   HCT 24.2 (L) 39.0 - 52.0 %    Comment: Performed at [facility], [place] [address], [place], [place] [address]  Vitamin B12     Status: Abnormal   Collection Time: 01/11/21  4:55 PM  Result Value Ref Range   Vitamin B-12 1,268 (H) 180 - 914 pg/mL    Comment: (NOTE) This assay is not validated for testing neonatal or myeloproliferative syndrome specimens for Vitamin B12 levels. Performed at [facility], [place] [address], [place], [place] [address]   TSH     Status: None   Collection Time: 01/11/21  4:55 PM  Result Value Ref Range   TSH  3.131 0.350 - 4.500 uIU/mL    Comment: Performed by a 3rd Generation assay with a functional sensitivity of <=0.01 uIU/mL. Performed at [facility], [place] [address] [place], [place] [address]   Hemoglobin and hematocrit, blood     Status: Abnormal   Collection Time: 01/12/21  5:15 AM  Result Value Ref Range   Hemoglobin 6.7 (LL) 13.0 - 17.0 g/dL    Comment: This critical result has verified and been called to DAVIS,R by SEEL,MOLLY on 06 19 2022 at 0613, and has been read back.    HCT 20.9 (L) 39.0 - 52.0 %    Comment: Performed at [facility], [place] [address], [place], [place] [address]    Studies/Results: No results found.  Medications: I have reviewed the patient's current medications.  Assessment: Hematochezia, pandiverticulosis and old blood noted throughout colon, on 01/09/2021  Hemoglobin yesterday of 10.5,  was likely not accurate, however hemoglobin dropped from 7.8 yesterday to 6.7 today   Plan: Stat NM GI blood loss scan, if active bleeding noted, IR evaluation for embolization. Transfuse to keep hemoglobin above 7. Discussed the same with the patient at bedside. Currently appears to have stable hemodynamics.  Ronnette Juniper, MD 01/12/2021, 8:02 AM

## 2021-01-12 NOTE — Progress Notes (Signed)
OT Cancellation Note  Patient Details Name: [name] MRN: [id number] DOB: 07-31-1950   Cancelled Treatment:    Reason Eval/Treat Not Completed: Medical issues which prohibited therapy. Patient needing 2 units PRBC then going for scan per nursing. Will hold OT for today and check back at later date.  Delbert Phenix OT OT pager: [place] 01/12/2021, 11:43 AM

## 2021-01-12 NOTE — Progress Notes (Signed)
Chief Complaint: Patient was seen in consultation today for recurrent lower GI bleeding   Supervising Physician: Arne Cleveland  Patient Status: [facility] - In-pt  History of Present Illness: [name] is a 70 y.o. male with recurrent but intermittent lower GI bleeding. Underwent angiogram on 6/11 after +CTA. Unfortunately source vessel could not be identified and no embolization was performed. He's done fairly well since then, had colonoscopy which showed diverticular disease and old blood but no obvious source of active bleeding identified. His hgb dropped again and he's had more bloody BMs. NM bleeding scan ordered and IR is asked to re-eval for possible angiogram with embo. PMHx, meds, labs, imaging, allergies reviewed.   Past Medical History:  Diagnosis Date   Allergy    Arthritis    BPH (benign prostatic hyperplasia)    Complication of anesthesia    Pt. stated he had a reaction that ended in him requiring urinary cath placement   Diverticulosis    GERD (gastroesophageal reflux disease)    esophageal spasms   Glaucoma    Gout    Head injury, closed, with concussion    Hepatitis C    chronic - Has been treated with Harvoni   HLD (hyperlipidemia)    statin intolerant (Crestor & Simvastatin) - Taking Livalo 1mg  / week   Hypertension    Parkinson's disease ([place])    Plantar fasciitis    right   PVD (peripheral vascular disease) ([place])    With no claudication; only mild abdominal aortic atherosclerosis noted on ultrasound.   Thoracic ascending aortic aneurysm (HCC)    4.2 cm ascending TAA 09/2016 CT, 1 yr f/u rec   Ulcer     Past Surgical History:  Procedure Laterality Date   BUNIONECTOMY WITH WEIL OSTEOTOMY Right 11/02/2019   Procedure: Right Foot Lapidus, Modified McBride Bunionectomy,  Hallux Akin Osteotomy;  Surgeon: Wylene Simmer, MD;  Location: [place];  Service: Orthopedics;  Laterality: Right;   CARDIAC CATHETERIZATION  2005   30% Cx. Dr.  Melvern Banker   cataract surgery Left 11/05/2014   COLONOSCOPY     COLONOSCOPY N/A 01/09/2021   Procedure: COLONOSCOPY;  Surgeon: Ronnette Juniper, MD;  Location: [facility];  Service: Gastroenterology;  Laterality: N/A;   ESOPHAGOGASTRODUODENOSCOPY N/A 05/02/2015   Procedure: ESOPHAGOGASTRODUODENOSCOPY (EGD);  Surgeon: Ronald Lobo, MD;  Location: [facility];  Service: Endoscopy;  Laterality: N/A;   ESOPHAGOGASTRODUODENOSCOPY  05/02/2015   no source of pt chest pain endoscopically evident. small hiatal hernia.   ESOPHAGOGASTRODUODENOSCOPY (EGD) WITH PROPOFOL N/A 01/02/2021   Procedure: ESOPHAGOGASTRODUODENOSCOPY (EGD) WITH PROPOFOL;  Surgeon: Clarene Essex, MD;  Location: [facility];  Service: Endoscopy;  Laterality: N/A;   EYE SURGERY     HARDWARE REMOVAL Right 11/02/2019   Procedure: Second Metatarsal Removal of Deep Implant and Rotational Osteotomy;  Surgeon: Wylene Simmer, MD;  Location: [place];  Service: Orthopedics;  Laterality: Right;   IR ANGIOGRAM SELECTIVE EACH ADDITIONAL VESSEL  01/04/2021   IR ANGIOGRAM SELECTIVE EACH ADDITIONAL VESSEL  01/04/2021   IR ANGIOGRAM VISCERAL SELECTIVE  01/04/2021   IR US GUIDE VASC ACCESS RIGHT  01/04/2021   MEMBRANE PEEL Left 03/14/2014   Procedure: MEMBRANE PEEL; ENDOLASER;  Surgeon: Hurman Horn, MD;  Location: [place];  Service: Ophthalmology;  Laterality: Left;   NM MYOVIEW LTD  2011   Neg Ischemia or infarct.   NM MYOVIEW LTD  10/2015   LOW RISK. Small, fixed basal lateral defect - likely diaphragmatic attenuation. EF 69%  PARS PLANA VITRECTOMY Left 03/14/2014   Procedure: PARS PLANA VITRECTOMY WITH 25 GAUGE;  Surgeon: Hurman Horn, MD;  Location: [employer];  Service: Ophthalmology;  Laterality: Left;   TONSILLECTOMY     TRANSTHORACIC ECHOCARDIOGRAM  2013   Normal EF. No significant Valve Disease   UPPER GASTROINTESTINAL ENDOSCOPY     WEIL OSTEOTOMY Right 09/01/2017   Procedure: RIGHT GREAT TOE CHEVRON AND WEIL OSTEOTOMY 2ND METATARSAL;   Surgeon: Newt Minion, MD;  Location: [place];  Service: Orthopedics;  Laterality: Right;    Allergies: Patient has no known allergies.  Medications:  Current Facility-Administered Medications:    0.9 %  sodium chloride infusion (Manually program via Guardrails IV Fluids), , Intravenous, Once, Ronnette Juniper, MD   0.9 %  sodium chloride infusion (Manually program via Guardrails IV Fluids), , Intravenous, Once, Ronnette Juniper, MD   0.9 %  sodium chloride infusion (Manually program via Guardrails IV Fluids), , Intravenous, Once, Blount, Xenia T, NP   0.9 %  sodium chloride infusion (Manually program via Guardrails IV Fluids), , Intravenous, Once, Adhikari, Amrit, MD   0.9 %  sodium chloride infusion, , Intravenous, Continuous, Adhikari, Amrit, MD, Last Rate: 100 mL/hr at 01/12/21 1145, New Bag at 01/12/21 1145   acetaminophen (TYLENOL) tablet 500 mg, 500 mg, Oral, Q6H PRN, Ronnette Juniper, MD, 500 mg at 01/08/21 1815   atorvastatin (LIPITOR) tablet 40 mg, 40 mg, Oral, QPM, Ronnette Juniper, MD, 40 mg at 01/11/21 1850   carbidopa-levodopa (SINEMET IR) 25-100 MG per tablet immediate release 1.5 tablet, 1.5 tablet, Oral, TID, Ronnette Juniper, MD, 1.5 tablet at 01/12/21 0955   Chlorhexidine Gluconate Cloth 2 % PADS 6 each, 6 each, Topical, Daily, Ronnette Juniper, MD, 6 each at 01/12/21 0956   citalopram (CELEXA) tablet 20 mg, 20 mg, Oral, Daily, Ronnette Juniper, MD, 20 mg at 01/12/21 0957   docusate sodium (COLACE) capsule 100 mg, 100 mg, Oral, Daily PRN, Ronnette Juniper, MD   haloperidol lactate (HALDOL) injection 2 mg, 2 mg, Intravenous, Q6H PRN, Adhikari, Amrit, MD   labetalol (NORMODYNE) injection 10 mg, 10 mg, Intravenous, Q2H PRN, Tawanna Solo, Amrit, MD, 10 mg at 01/11/21 2239   MEDLINE mouth rinse, 15 mL, Mouth Rinse, BID, Ronnette Juniper, MD, 15 mL at 01/12/21 0956   multivitamin with minerals tablet 1 tablet, 1 tablet, Oral, Daily, Ronnette Juniper, MD, 1 tablet at 01/12/21 0955   nitroGLYCERIN (NITROSTAT) SL tablet 0.4 mg, 0.4 mg,  Sublingual, Q5 min PRN, Ronnette Juniper, MD, 0.4 mg at 01/05/21 0653   pantoprazole (PROTONIX) EC tablet 40 mg, 40 mg, Oral, Daily, Adhikari, Amrit, MD, 40 mg at 01/12/21 0957   polyethylene glycol (MIRALAX / GLYCOLAX) packet 17 g, 17 g, Oral, Daily PRN, Ronnette Juniper, MD   psyllium (HYDROCIL/METAMUCIL) 1 packet, 1 packet, Oral, Daily, Ronnette Juniper, MD, 1 packet at 01/11/21 0849   QUEtiapine (SEROQUEL) tablet 25 mg, 25 mg, Oral, QHS, Adhikari, Amrit, MD, 25 mg at 01/11/21 2233   sodium chloride (OCEAN) 0.65 % nasal spray 1 spray, 1 spray, Each Nare, PRN, Ronnette Juniper, MD, 1 spray at 01/06/21 1849   timolol (TIMOPTIC) 0.5 % ophthalmic solution 1 drop, 1 drop, Both Eyes, Daily, Ronnette Juniper, MD, 1 drop at 01/12/21 0958   trihexyphenidyl (ARTANE) tablet 1 mg, 1 mg, Oral, BID WC, Ronnette Juniper, MD, 1 mg at 01/12/21 3254    Family History  Problem Relation Age of Onset   Heart disease Mother    Cancer Paternal Grandfather    Cancer  Sister     Social History   Socioeconomic History   Marital status: Married    Spouse name: barbra gen   Number of children: 3   Years of education: AS   Highest education level: Not on file  Occupational History   Occupation: self employed    Employer: [employer]    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  Tobacco Use   Smoking status: Former    Pack years: 0.00    Types: Cigars    Quit date: 07/27/1988    Years since quitting: 32.4   Smokeless tobacco: Never  Vaping Use   Vaping Use: Never used  Substance and Sexual Activity   Alcohol use: Yes    Alcohol/week: 3.0 standard drinks    Types: 1 Glasses of wine, 1 Cans of beer, 1 Shots of liquor per week    Comment: occasional   Drug use: No   Sexual activity: Yes  Other Topics Concern   Not on file  Social History Narrative   Lives with wife    Right handed   Drinks 1-2 cups caffeine daily   Social Determinants of Health   Financial Resource Strain: Not on file  Food Insecurity: Not on file  Transportation Needs:  Not on file  Physical Activity: Not on file  Stress: Not on file  Social Connections: Not on file    Review of Systems: A 12 point ROS discussed and pertinent positives are indicated in the HPI above.  All other systems are negative.  Review of Systems  Vital Signs: BP (!) 157/79   Pulse 73   Temp 97.9 F (36.6 C) (Oral)   Resp 20   Ht 5\' 10"  (1.778 m)   Wt 69.6 kg   SpO2 99%   BMI 22.02 kg/m   Physical Exam Constitutional:      Appearance: Normal appearance. He is not ill-appearing.  HENT:     Mouth/Throat:     Mouth: Mucous membranes are moist.     Pharynx: Oropharynx is clear.  Cardiovascular:     Rate and Rhythm: Normal rate and regular rhythm.     Pulses: Normal pulses.     Heart sounds: Normal heart sounds.  Pulmonary:     Effort: Pulmonary effort is normal. No respiratory distress.     Breath sounds: Normal breath sounds.  Abdominal:     General: Abdomen is flat. There is no distension.     Palpations: Abdomen is soft.     Tenderness: There is no abdominal tenderness.  Skin:    General: Skin is warm and dry.  Neurological:     General: No focal deficit present.     Mental Status: He is alert and oriented to person, place, and time.  Psychiatric:        Mood and Affect: Mood normal.        Thought Content: Thought content normal.        Judgment: Judgment normal.    Imaging: IR Angiogram Visceral Selective  Result Date: 01/04/2021 INDICATION: 70 year old male with a history of lower GI hemorrhage, isolated to the ascending colon on recent CT angiogram, presents for mesenteric angiogram and possible embolization EXAM: ULTRASOUND-GUIDED ACCESS RIGHT COMMON FEMORAL ARTERY MESENTERIC ANGIOGRAM INVOLVING SMA, SUB SELECTION RIGHT COLIC ARTERY, AND SUPER SELECTIVE ANGIOGRAM OF TERMINAL RIGHT COLIC ARTERY BRANCHES EXOSEAL FOR HEMOSTASIS MEDICATIONS: None ANESTHESIA/SEDATION: Moderate (conscious) sedation was employed during this procedure. A total of Versed 1.0 mg  and Fentanyl 25 mcg was administered intravenously. Moderate Sedation  Time: 39 minutes. The patient's level of consciousness and vital signs were monitored continuously by radiology nursing throughout the procedure under my direct supervision. CONTRAST:  80 cc FLUOROSCOPY TIME:  Fluoroscopy Time: 5 minutes 36 seconds (2,325 mGy). COMPLICATIONS: None PROCEDURE: Informed consent was obtained from the patient following explanation of the procedure, risks, benefits and alternatives. The patient understands, agrees and consents for the procedure. All questions were addressed. A time out was performed prior to the initiation of the procedure. Maximal barrier sterile technique utilized including caps, mask, sterile gowns, sterile gloves, large sterile drape, hand hygiene, and Betadine prep. Ultrasound survey of the right inguinal region was performed with images stored and sent to PACs, confirming patency of the vessel. A micropuncture needle was used access the right common femoral artery under ultrasound. With excellent arterial blood flow returned, and an .018 micro wire was passed through the needle, observed enter the abdominal aorta under fluoroscopy. The needle was removed, and a micropuncture sheath was placed over the wire. The inner dilator and wire were removed, and an 035 Bentson wire was advanced under fluoroscopy into the abdominal aorta. The sheath was removed and a standard 5 [place] vascular sheath was placed. The dilator was removed and the sheath was flushed. C2 Cobra catheter advanced on the Bentson wire. Cobra catheter was used to select the SMA. Angiogram was performed. We attempted to advance the C2 catheter on a standard Glidewire, which was unable given the tortuous SMA takeoff. A coaxial high-flow Renegade 135 cm microcatheter was then advanced on 014 fathom wire into the ileocolic artery. Angiogram was performed. The right colic artery was selected. Angiogram was performed. The microcatheter was  then used to select terminal branches of the right colic artery independently, with angiogram performed at each artery in multiple obliquities using both breath hold and free breathing technique. Ultimately, we could not isolate the site of hemorrhage correlating to the findings on the CT angiogram as there was no extravasation of count tract, shunting, or angio dysplasia that was target oval for empiric embolization. All catheters wires were removed and Exoseal was deployed for hemostasis. Patient tolerated the procedure well and remained hemodynamically stable throughout. No complications were encountered and no significant blood loss. IMPRESSION: Status post ultrasound guided access right common femoral artery for SMA angiogram, super selective angiogram the right colic arteries of interest in attempt to isolate the source of recent lower GI hemorrhage. No embolization was performed. ExoSeal for hemostasis. Signed, Dulcy Fanny. Dellia Nims, RPVI [facility] Radiology Electronically Signed   By: Corrie Mckusick D.O.   On: 01/04/2021 13:01   IR Angiogram Selective Each Additional Vessel  Result Date: 01/04/2021 INDICATION: 70 year old male with a history of lower GI hemorrhage, isolated to the ascending colon on recent CT angiogram, presents for mesenteric angiogram and possible embolization EXAM: ULTRASOUND-GUIDED ACCESS RIGHT COMMON FEMORAL ARTERY MESENTERIC ANGIOGRAM INVOLVING SMA, SUB SELECTION RIGHT COLIC ARTERY, AND SUPER SELECTIVE ANGIOGRAM OF TERMINAL RIGHT COLIC ARTERY BRANCHES EXOSEAL FOR HEMOSTASIS MEDICATIONS: None ANESTHESIA/SEDATION: Moderate (conscious) sedation was employed during this procedure. A total of Versed 1.0 mg and Fentanyl 25 mcg was administered intravenously. Moderate Sedation Time: 39 minutes. The patient's level of consciousness and vital signs were monitored continuously by radiology nursing throughout the procedure under my direct  supervision. CONTRAST:  80 cc FLUOROSCOPY TIME:  Fluoroscopy Time: 5 minutes 36 seconds (2,325 mGy). COMPLICATIONS: None PROCEDURE: Informed consent was obtained from the patient following explanation of the procedure, risks, benefits and  alternatives. The patient understands, agrees and consents for the procedure. All questions were addressed. A time out was performed prior to the initiation of the procedure. Maximal barrier sterile technique utilized including caps, mask, sterile gowns, sterile gloves, large sterile drape, hand hygiene, and Betadine prep. Ultrasound survey of the right inguinal region was performed with images stored and sent to PACs, confirming patency of the vessel. A micropuncture needle was used access the right common femoral artery under ultrasound. With excellent arterial blood flow returned, and an .018 micro wire was passed through the needle, observed enter the abdominal aorta under fluoroscopy. The needle was removed, and a micropuncture sheath was placed over the wire. The inner dilator and wire were removed, and an 035 Bentson wire was advanced under fluoroscopy into the abdominal aorta. The sheath was removed and a standard 5 [place] vascular sheath was placed. The dilator was removed and the sheath was flushed. C2 Cobra catheter advanced on the Bentson wire. Cobra catheter was used to select the SMA. Angiogram was performed. We attempted to advance the C2 catheter on a standard Glidewire, which was unable given the tortuous SMA takeoff. A coaxial high-flow Renegade 135 cm microcatheter was then advanced on 014 fathom wire into the ileocolic artery. Angiogram was performed. The right colic artery was selected. Angiogram was performed. The microcatheter was then used to select terminal branches of the right colic artery independently, with angiogram performed at each artery in multiple obliquities using both breath hold and free breathing technique. Ultimately, we could not isolate the  site of hemorrhage correlating to the findings on the CT angiogram as there was no extravasation of count tract, shunting, or angio dysplasia that was target oval for empiric embolization. All catheters wires were removed and Exoseal was deployed for hemostasis. Patient tolerated the procedure well and remained hemodynamically stable throughout. No complications were encountered and no significant blood loss. IMPRESSION: Status post ultrasound guided access right common femoral artery for SMA angiogram, super selective angiogram the right colic arteries of interest in attempt to isolate the source of recent lower GI hemorrhage. No embolization was performed. ExoSeal for hemostasis. Signed, Dulcy Fanny. Dellia Nims, RPVI [facility] Inter[facility] Radiology Special[facility] Radiology Electronically Signed   By: Corrie Mckusick D.O.   On: 01/04/2021 13:01   IR Angiogram Selective Each Additional Vessel  Result Date: 01/04/2021 INDICATION: 70 year old male with a history of lower GI hemorrhage, isolated to the ascending colon on recent CT angiogram, presents for mesenteric angiogram and possible embolization EXAM: ULTRASOUND-GUIDED ACCESS RIGHT COMMON FEMORAL ARTERY MESENTERIC ANGIOGRAM INVOLVING SMA, SUB SELECTION RIGHT COLIC ARTERY, AND SUPER SELECTIVE ANGIOGRAM OF TERMINAL RIGHT COLIC ARTERY BRANCHES EXOSEAL FOR HEMOSTASIS MEDICATIONS: None ANESTHESIA/SEDATION: Moderate (conscious) sedation was employed during this procedure. A total of Versed 1.0 mg and Fentanyl 25 mcg was administered intravenously. Moderate Sedation Time: 39 minutes. The patient's level of consciousness and vital signs were monitored continuously by radiology nursing throughout the procedure under my direct supervision. CONTRAST:  80 cc FLUOROSCOPY TIME:  Fluoroscopy Time: 5 minutes 36 seconds (2,325 mGy). COMPLICATIONS: None PROCEDURE: Informed consent was obtained from the patient following explanation of the procedure, risks, benefits and  alternatives. The patient understands, agrees and consents for the procedure. All questions were addressed. A time out was performed prior to the initiation of the procedure. Maximal barrier sterile technique utilized including caps, mask, sterile gowns, sterile gloves, large sterile drape, hand hygiene, and Betadine prep. Ultrasound survey of the right inguinal region was performed with images  stored and sent to PACs, confirming patency of the vessel. A micropuncture needle was used access the right common femoral artery under ultrasound. With excellent arterial blood flow returned, and an .018 micro wire was passed through the needle, observed enter the abdominal aorta under fluoroscopy. The needle was removed, and a micropuncture sheath was placed over the wire. The inner dilator and wire were removed, and an 035 Bentson wire was advanced under fluoroscopy into the abdominal aorta. The sheath was removed and a standard 5 [place] vascular sheath was placed. The dilator was removed and the sheath was flushed. C2 Cobra catheter advanced on the Bentson wire. Cobra catheter was used to select the SMA. Angiogram was performed. We attempted to advance the C2 catheter on a standard Glidewire, which was unable given the tortuous SMA takeoff. A coaxial high-flow Renegade 135 cm microcatheter was then advanced on 014 fathom wire into the ileocolic artery. Angiogram was performed. The right colic artery was selected. Angiogram was performed. The microcatheter was then used to select terminal branches of the right colic artery independently, with angiogram performed at each artery in multiple obliquities using both breath hold and free breathing technique. Ultimately, we could not isolate the site of hemorrhage correlating to the findings on the CT angiogram as there was no extravasation of count tract, shunting, or angio dysplasia that was target oval for empiric embolization. All catheters wires were removed and Exoseal was  deployed for hemostasis. Patient tolerated the procedure well and remained hemodynamically stable throughout. No complications were encountered and no significant blood loss. IMPRESSION: Status post ultrasound guided access right common femoral artery for SMA angiogram, super selective angiogram the right colic arteries of interest in attempt to isolate the source of recent lower GI hemorrhage. No embolization was performed. ExoSeal for hemostasis. Signed, Dulcy Fanny. Dellia Nims, RPVI [facility] Interventional Radiology Specialists [facility] Radiology Electronically Signed   By: Corrie Mckusick D.O.   On: 01/04/2021 13:01   IR US Guide Vasc Access Right  Result Date: 01/04/2021 INDICATION: 70 year old male with a history of lower GI hemorrhage, isolated to the ascending colon on recent CT angiogram, presents for mesenteric angiogram and possible embolization EXAM: ULTRASOUND-GUIDED ACCESS RIGHT COMMON FEMORAL ARTERY MESENTERIC ANGIOGRAM INVOLVING SMA, SUB SELECTION RIGHT COLIC ARTERY, AND SUPER SELECTIVE ANGIOGRAM OF TERMINAL RIGHT COLIC ARTERY BRANCHES EXOSEAL FOR HEMOSTASIS MEDICATIONS: None ANESTHESIA/SEDATION: Moderate (conscious) sedation was employed during this procedure. A total of Versed 1.0 mg and Fentanyl 25 mcg was administered intravenously. Moderate Sedation Time: 39 minutes. The patient's level of consciousness and vital signs were monitored continuously by radiology nursing throughout the procedure under my direct supervision. CONTRAST:  80 cc FLUOROSCOPY TIME:  Fluoroscopy Time: 5 minutes 36 seconds (2,325 mGy). COMPLICATIONS: None PROCEDURE: Informed consent was obtained from the patient following explanation of the procedure, risks, benefits and alternatives. The patient understands, agrees and consents for the procedure. All questions were addressed. A time out was performed prior to the initiation of the procedure. Maximal barrier sterile technique utilized including caps, mask, sterile gowns,  sterile gloves, large sterile drape, hand hygiene, and Betadine prep. Ultrasound survey of the right inguinal region was performed with images stored and sent to PACs, confirming patency of the vessel. A micropuncture needle was used access the right common femoral artery under ultrasound. With excellent arterial blood flow returned, and an .018 micro wire was passed through the needle, observed enter the abdominal aorta under fluoroscopy. The needle was removed, and a micropuncture sheath was placed over  the wire. The inner dilator and wire were removed, and an 035 Bentson wire was advanced under fluoroscopy into the abdominal aorta. The sheath was removed and a standard 5 [place] vascular sheath was placed. The dilator was removed and the sheath was flushed. C2 Cobra catheter advanced on the Bentson wire. Cobra catheter was used to select the SMA. Angiogram was performed. We attempted to advance the C2 catheter on a standard Glidewire, which was unable given the tortuous SMA takeoff. A coaxial high-flow Renegade 135 cm microcatheter was then advanced on 014 fathom wire into the ileocolic artery. Angiogram was performed. The right colic artery was selected. Angiogram was performed. The microcatheter was then used to select terminal branches of the right colic artery independently, with angiogram performed at each artery in multiple obliquities using both breath hold and free breathing technique. Ultimately, we could not isolate the site of hemorrhage correlating to the findings on the CT angiogram as there was no extravasation of count tract, shunting, or angio dysplasia that was target oval for empiric embolization. All catheters wires were removed and Exoseal was deployed for hemostasis. Patient tolerated the procedure well and remained hemodynamically stable throughout. No complications were encountered and no significant blood loss. IMPRESSION: Status post ultrasound guided access right common femoral artery for  SMA angiogram, super selective angiogram the right colic arteries of interest in attempt to isolate the source of recent lower GI hemorrhage. No embolization was performed. ExoSeal for hemostasis. Signed, Dulcy Fanny. Dellia Nims, RPVI [facility] Inter[facility] Radiology Specialists [facility] Radiology Electronically Signed   By: Corrie Mckusick D.O.   On: 01/04/2021 13:01   ECHOCARDIOGRAM COMPLETE  Result Date: 01/07/2021    ECHOCARDIOGRAM REPORT   Patient Name:   [name] Date of Exam: 01/07/2021 Medical Rec #:  [id number]       Height:       70.0 in Accession #:    [id number]      Weight:       153.4 lb Date of Birth:  [birth year]       BSA:          1.865 m Patient Age:    59 years        BP:           109/60 mmHg Patient Gender: M               HR:           75 bpm. Exam Location:  Inpatient Procedure: 2D Echo, Cardiac Doppler and Color Doppler Indications:    Chest pain, elevated troponin  History:        Patient has prior history of Echocardiogram examinations, most                 recent 01/20/2012. Signs/Symptoms:Chest Pain; Risk                 Factors:Dyslipidemia and Hypertension. 01/20/2012 stress echo.  Sonographer:    Luisa Hart RDCS Referring Phys: [facility]  1. Left ventricular ejection fraction, by estimation, is 60 to 65%. The left ventricle has normal function. The left ventricle has no regional wall motion abnormalities. Left ventricular diastolic parameters are consistent with Grade II diastolic dysfunction (pseudonormalization).  2. Right ventricular systolic function is normal. The right ventricular size is mildly enlarged. Tricuspid regurgitation signal is inadequate for assessing PA pressure.  3. Left atrial size was moderately dilated.  4. The mitral valve is normal in structure. No  evidence of mitral valve regurgitation. No evidence of mitral stenosis.  5. The aortic valve is tricuspid. Aortic valve regurgitation is trivial. Mild to moderate aortic valve  sclerosis/calcification is present, without any evidence of aortic stenosis.  6. The inferior vena cava is normal in size with greater than 50% respiratory variability, suggesting right atrial pressure of 3 mmHg. FINDINGS  Left Ventricle: Left ventricular ejection fraction, by estimation, is 60 to 65%. The left ventricle has normal function. The left ventricle has no regional wall motion abnormalities. The left ventricular internal cavity size was normal in size. There is  no left ventricular hypertrophy. Left ventricular diastolic parameters are consistent with Grade II diastolic dysfunction (pseudonormalization). Right Ventricle: The right ventricular size is mildly enlarged.Right ventricular systolic function is normal. Tricuspid regurgitation signal is inadequate for assessing PA pressure. The tricuspid regurgitant velocity is 2.33 m/s, and with an assumed right atrial pressure of 3 mmHg, the estimated right ventricular systolic pressure is 03.5 mmHg. Left Atrium: Left atrial size was moderately dilated. Right Atrium: Right atrial size was normal in size. Pericardium: Trivial pericardial effusion is present. Mitral Valve: The mitral valve is normal in structure. No evidence of mitral valve regurgitation. No evidence of mitral valve stenosis. Tricuspid Valve: The tricuspid valve is normal in structure. Tricuspid valve regurgitation is mild . No evidence of tricuspid stenosis. Aortic Valve: The aortic valve is tricuspid. Aortic valve regurgitation is trivial. Aortic regurgitation PHT measures 1040 msec. Mild to moderate aortic valve sclerosis/calcification is present, without any evidence of aortic stenosis. Aortic valve mean gradient measures 4.5 mmHg. Aortic valve peak gradient measures 8.0 mmHg. Aortic valve area, by VTI measures 2.71 cm. Pulmonic Valve: The pulmonic valve was normal in structure. Pulmonic valve regurgitation is trivial. No evidence of pulmonic stenosis. Aorta: The aortic root is normal in  size and structure. Venous: The inferior vena cava is normal in size with greater than 50% respiratory variability, suggesting right atrial pressure of 3 mmHg. IAS/Shunts: No atrial level shunt detected by color flow Doppler.  LEFT VENTRICLE PLAX 2D LVIDd:         4.70 cm     Diastology LVIDs:         2.70 cm     LV e' medial:    6.09 cm/s LV PW:         1.10 cm     LV E/e' medial:  13.6 LV IVS:        1.00 cm     LV e' lateral:   7.51 cm/s LVOT diam:     2.30 cm     LV E/e' lateral: 11.0 LV SV:         84 LV SV Index:   45 LVOT Area:     4.15 cm  LV Volumes (MOD) LV vol d, MOD A2C: 48.9 ml LV vol d, MOD A4C: 94.0 ml LV vol s, MOD A2C: 16.9 ml LV vol s, MOD A4C: 27.6 ml LV SV MOD A2C:     32.0 ml LV SV MOD A4C:     94.0 ml LV SV MOD BP:      47.7 ml RIGHT VENTRICLE RV Basal diam:  4.40 cm RV Mid diam:    3.20 cm RV S prime:     16.00 cm/s TAPSE (M-mode): 2.1 cm LEFT ATRIUM              Index       RIGHT ATRIUM  Index LA diam:        3.80 cm  2.04 cm/m  RA Area:     17.60 cm LA Vol (A2C):   141.0 ml 75.60 ml/m RA Volume:   47.20 ml  25.31 ml/m LA Vol (A4C):   65.4 ml  35.07 ml/m LA Biplane Vol: 105.0 ml 56.30 ml/m  AORTIC VALVE                   PULMONIC VALVE AV Area (Vmax):    2.61 cm    PV Vmax:       0.84 m/s AV Area (Vmean):   2.48 cm    PV Vmean:      67.800 cm/s AV Area (VTI):     2.71 cm    PV VTI:        0.222 m AV Vmax:           141.00 cm/s PV Peak grad:  2.8 mmHg AV Vmean:          96.000 cm/s PV Mean grad:  2.0 mmHg AV VTI:            0.312 m AV Peak Grad:      8.0 mmHg AV Mean Grad:      4.5 mmHg LVOT Vmax:         88.60 cm/s LVOT Vmean:        57.400 cm/s LVOT VTI:          0.203 m LVOT/AV VTI ratio: 0.65 AI PHT:            1040 msec  AORTA Ao Root diam: 3.60 cm Ao Asc diam:  3.10 cm MITRAL VALVE               TRICUSPID VALVE MV Area (PHT): 2.63 cm    TR Peak grad:   21.7 mmHg MV Decel Time: 288 msec    TR Vmax:        233.00 cm/s MV E velocity: 82.60 cm/s MV A velocity: 69.50 cm/s   SHUNTS MV E/A ratio:  1.19        Systemic VTI:  0.20 m                            Systemic Diam: 2.30 cm Kirk Ruths MD Electronically signed by Kirk Ruths MD Signature Date/Time: 01/07/2021/11:42:42 AM    Final    OCT, Retina - OU - Both Eyes  Result Date: 01/02/2021 Right Eye Quality was good. Scan locations included subfoveal. Central Foveal Thickness: 205. Findings include outer retinal atrophy, central retinal atrophy. Left Eye Quality was good. Scan locations included subfoveal. Central Foveal Thickness: 381. Progression has been stable. Notes Diffuse macular atrophy right eye no interval change Mild diffuse macular thickening left eye with no active CME.  No change in foveal elevation over the last 2 years. Minor CME superonasal, but this is old and involutional and unchanged for years with no encroachment to center of the fovea  Color Fundus Photography Optos - OU - Both Eyes  Result Date: 01/02/2021 Right Eye Progression has been stable. Notes OD with cloudy media secondary to corneal opacity, striae from corneal edema Retina attached, optic nerve atrophy good PRP peripheral OS, mild temporal optic atrophy. 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, no active macular edema  VAS US CAROTID  Result Date: 01/02/2021 Carotid Arterial Duplex Study Patient Name:  [name]  Date of Exam:  01/01/2021 Medical Rec #: [id number]        Accession #:    [id number] Date of Birth: 30-Dec-1950        Patient Gender: M Patient Age:   27Y Exam Location:  [facility] Procedure:      VAS US CAROTID Referring Phys: [id number] DAVID W HARDING --------------------------------------------------------------------------------  Indications:       Patient reports intermittent tingling sensations in the left                    jaw area, across the left chest area at surround the front                    area of the left knee. Symptoms do not always come in all                    areas at the same time. These symptoms have been going  on for                    one to two months. He also states he has been having                    headaches for the past week but relates it to a new                    medication he is taking (could not recall the name). 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 Lightheadedness when he has to look                    over the right shoulder while driving. He denies any other                    cerebrovascular symptoms. Risk Factors:      Hyperlipidemia, past history of smoking. Comparison Study:  NA Performing Technologist: Sharlett Iles RVT  Examination Guidelines: A complete evaluation includes B-mode imaging, spectral Doppler, color Doppler, and power Doppler as needed of all accessible portions of each vessel. Bilateral testing is considered an integral part of a complete examination. Limited examinations for reoccurring indications may be performed as noted.  Right Carotid Findings: +----------+--------+--------+--------+------------------+--------+           PSV cm/sEDV cm/sStenosisPlaque DescriptionComments +----------+--------+--------+--------+------------------+--------+ CCA Prox  53      6                                 tortuous +----------+--------+--------+--------+------------------+--------+ CCA Distal57      13                                         +----------+--------+--------+--------+------------------+--------+ ICA Prox  43      10      1-39%   heterogenous               +----------+--------+--------+--------+------------------+--------+ ICA Mid   41      17              heterogenous               +----------+--------+--------+--------+------------------+--------+  ICA Distal35      17                                         +----------+--------+--------+--------+------------------+--------+ ECA       77      10                                          +----------+--------+--------+--------+------------------+--------+ +----------+--------+-------+---------+-------------------+           PSV cm/sEDV cmsDescribe Arm Pressure (mmHG) +----------+--------+-------+---------+-------------------+ [id number]            Turbulent117                 +----------+--------+-------+---------+-------------------+ +---------+--------+--+--------+-+---------------------------+ VertebralPSV cm/s26EDV cm/s6Antegrade and Small caliber +---------+--------+--+--------+-+---------------------------+  Left Carotid Findings: +----------+--------+--------+--------+------------------+--------+           PSV cm/sEDV cm/sStenosisPlaque DescriptionComments +----------+--------+--------+--------+------------------+--------+ CCA Prox  81      14                                         +----------+--------+--------+--------+------------------+--------+ CCA Distal78      15                                         +----------+--------+--------+--------+------------------+--------+ ICA Prox  101     33      1-39%   heterogenous               +----------+--------+--------+--------+------------------+--------+ ICA Mid   49      17                                         +----------+--------+--------+--------+------------------+--------+ ICA Distal52      24                                         +----------+--------+--------+--------+------------------+--------+ ECA       81      10                                         +----------+--------+--------+--------+------------------+--------+ +----------+--------+--------+----------------+-------------------+           PSV cm/sEDV cm/sDescribe        Arm Pressure (mmHG) +----------+--------+--------+----------------+-------------------+ Subclavian160             Multiphasic, [id number]                 +----------+--------+--------+----------------+-------------------+  +---------+--------+--+--------+--+---------+ VertebralPSV cm/s37EDV cm/s13Antegrade +---------+--------+--+--------+--+---------+   Summary: Right Carotid: Velocities in the right ICA are consistent with a 1-39% stenosis. Left Carotid: Velocities in the left ICA are consistent with a 1-39% stenosis. Vertebrals:  Bilateral vertebral arteries demonstrate antegrade flow. Small              caliber right vertebral artery. Subclavians: Right subclavian artery flow was disturbed. Normal flow  hemodynamics were seen in the left subclavian artery. *See table(s) above for measurements and observations.  Electronically signed by Larae Grooms MD on 01/02/2021 at 2:23:23 PM.    Final    CT Angio Abd/Pel w/ and/or w/o  Result Date: 01/04/2021 CLINICAL DATA:  70 year old male with melena and maroon colored stools. Evaluate for active GI bleed. EXAM: CTA ABDOMEN AND PELVIS WITHOUT AND WITH CONTRAST TECHNIQUE: Multidetector CT imaging of the abdomen and pelvis was performed using the standard protocol during bolus administration of intravenous contrast. Multiplanar reconstructed images and MIPs were obtained and reviewed to evaluate the vascular anatomy. CONTRAST:  148mL OMNIPAQUE IOHEXOL 350 MG/ML SOLN COMPARISON:  Prior CT abdomen/pelvis 04/18/2018; CT chest 10/08/2016 FINDINGS: VASCULAR Aorta: Scattered atherosclerotic vascular calcifications throughout the abdominal aorta. No evidence of aneurysm or dissection. Celiac: Patent without evidence of aneurysm, dissection, vasculitis or significant stenosis. SMA: Patent without evidence of aneurysm, dissection, vasculitis or significant stenosis. Renals: Mild stenosis at the origin of the right renal artery secondary to fibrofatty atherosclerotic plaque. Left renal artery is widely patent. No accessory renal arteries. IMA: Focal moderate to high-grade stenosis at the origin. The remainder of the artery is patent. Inflow: The iliac arteries are highly  tortuous. Scattered atherosclerotic calcifications without significant stenosis, occlusion, dissection or aneurysm. Proximal Outflow: Bilateral common femoral and visualized portions of the superficial and profunda femoral arteries are patent without evidence of aneurysm, dissection, vasculitis or significant stenosis. Veins: No focal venous abnormality. Review of the MIP images confirms the above findings. NON-VASCULAR Lower chest: No acute abnormality. Greater than 2 year stability of small pulmonary nodules (images 17 and 28 of series 10) consistent with benign granuloma. Trace dependent atelectasis. The visualized heart is normal in size. No pericardial effusion. No significant abnormality of the visualized distal thoracic esophagus. Hepatobiliary: No focal liver abnormality is seen. No gallstones, gallbladder wall thickening, or biliary dilatation. Pancreas: Unremarkable. No pancreatic ductal dilatation or surrounding inflammatory changes. Spleen: Normal in size without focal abnormality. Adrenals/Urinary Tract: The adrenal glands are normal. No hydronephrosis, nephrolithiasis or enhancing renal mass. Large simple cysts exophytic from the upper pole of the right kidney in the anterior interpolar left kidney. The ureters and bladder are unremarkable. Stomach/Bowel: Active extravasation of arterial phase contrast into a diverticulum of the ascending colon just proximal to the hepatic flexure (images 80-94) with evidence of blooming and expansion of the contrast blush on the delayed phase images. Findings are consistent with acute diverticular hemorrhage. Stomach and proximal small bowel are within normal limits. No focal bowel wall thickening or evidence of bowel obstruction. Lymphatic: No suspicious lymphadenopathy. Reproductive: Prostatomegaly. Other: Fat containing umbilical hernia.  No evidence of ascites. Musculoskeletal: No acute fracture or aggressive appearing lytic or blastic osseous lesion. Mild grade 1  anterolisthesis of L4 on L5 without spondylo lysis. Associated lower lumbar degenerative disc disease and facet arthropathy. IMPRESSION: 1. Positive for active diverticular bleed in the ascending colon just proximal to the hepatic flexure. 2. Scattered atherosclerotic vascular calcifications without aneurysm, dissection or occlusion. Aortic Atherosclerosis (ICD10-I70.0). 3. Progressive lower lumbar degenerative disc disease and facet arthropathy now with mild grade 1 anterolisthesis of L4 on L5. 4. Additional ancillary findings as above without significant interval change compared to prior imaging. Electronically Signed   By: Jacqulynn Cadet M.D.   On: 01/04/2021 09:38    Labs:  CBC: Recent Labs    01/05/21 1821 01/06/21 0257 01/06/21 1657 01/07/21 0245 01/07/21 1538 01/11/21 0259 01/11/21 1655 01/12/21 0515 01/12/21 0657  WBC 9.0  9.0 9.1 10.5  --   --   --   --   --   HGB 7.7* 7.7* 8.0* 7.8*   < > 10.5* 7.8* 6.7* 7.3*  HCT 23.2* 23.2* 24.2* 24.0*   < > 31.5* 24.2* 20.9* 22.4*  PLT 145* 164 170 164  --   --   --   --   --    < > = values in this interval not displayed.    COAGS: No results for input(s): INR, APTT in the last 8760 hours.  BMP: Recent Labs    09/10/20 1058 01/02/21 1238 01/07/21 0245 01/09/21 0249 01/10/21 0303 01/11/21 0259  NA 137   < > 138 137 138 138  K 4.3   < > 4.5 3.6 3.7 3.6  CL 101   < > 109 108 111 109  CO2 22   < > 25 26 23 25   GLUCOSE 96   < > 102* 89 109* 88  BUN 22   < > 24* 17 15 12   CALCIUM 9.4   < > 8.1* 8.2* 8.2* 8.0*  CREATININE 1.24   < > 0.99 0.97 1.07 1.06  GFRNONAA 59*   < > >60 >60 >60 >60  GFRAA 68  --   --   --   --   --    < > = values in this interval not displayed.    LIVER FUNCTION TESTS: Recent Labs    09/10/20 1058 01/02/21 1238 01/09/21 0249  BILITOT 0.5 0.5 0.7  AST 16 13* 18  ALT 10 8 8   ALKPHOS 74 37* 48  PROT 7.4 5.3* 5.7*  ALBUMIN 4.8 3.1* 3.6    TUMOR MARKERS: No results for input(s): AFPTM, CEA,  CA199, CHROMGRNA in the last 8760 hours.  Assessment and Plan: Recurrent intermittent lower GI bleed Await NM RBC scan today. Discussed possible need for repeat angiogram and embolization if bleeding scan positive. Risks and benefits of mesenteric angiogram were discussed with the patient including, but not limited to bleeding, infection, vascular injury or contrast induced renal failure.  This interventional procedure involves the use of X-rays and because of the nature of the planned procedure, it is possible that we will have prolonged use of X-ray fluoroscopy.  Potential radiation risks to you include (but are not limited to) the following: - A slightly elevated risk for cancer  several years later in life. This risk is typically less than 0.5% percent. This risk is low in comparison to the normal incidence of human cancer, which is 33% for women and 50% for men according to the [place]. - Radiation induced injury can include skin redness, resembling a rash, tissue breakdown / ulcers and hair loss (which can be temporary or permanent).   The likelihood of either of these occurring depends on the difficulty of the procedure and whether you are sensitive to radiation due to previous procedures, disease, or genetic conditions.   IF your procedure requires a prolonged use of radiation, you will be notified and given written instructions for further action.  It is your responsibility to monitor the irradiated area for the 2 weeks following the procedure and to notify your physician if you are concerned that you have suffered a radiation induced injury.    All of the patient's questions were answered, patient is agreeable to proceed.  Consent signed and in chart.   Thank you for this interesting consult.  I greatly enjoyed meeting [name] and look  forward to participating in their care.  A copy of this report was sent to the requesting provider on this  date.  Electronically Signed: Ascencion Dike, PA-C 01/12/2021, 1:45 PM   I spent a total of 20 minutes in face to face in clinical consultation, greater than 50% of which was counseling/coordinating care for GI bleed/mesenteric angio

## 2021-01-12 NOTE — Progress Notes (Addendum)
PROGRESS NOTE    [name]  VPX:[id number] DOB: Mar 06, 1951 DOA: 01/02/2021 PCP: Denita Lung, MD   Chief Complain: Black stools  Brief Narrative: Patient is a 70 year old African-American male with past medical history of Parkinson's disease, BPH, hypertension, hyperlipidemia, previous GI bleed who presented to the emergency department with complaints of positive black stools.  Patient was found to be anemic in presentation.  GI was consulted after the hospitalization.  He underwent EGD without finding of any source of bleeding.  Patient underwent CTA abdomen/pelvis that localized bleeding on the colon area.  IR was consulted but ablation was not successful.  Patient also complained of chest discomfort on 01/05/2021, had mildly elevated troponin for which cardiology was consulted.  Patient underwent colonoscopy without finding of any acute bleeding, diverticulosis.  Patient became hypotensive in the evening of 01/09/2021 and had to be transferred to stepdown again.  Blood pressure was stable but he developed acute confusion, most likely hospital delirium.  Neurology consulted as per request of the family.  Mental status has  improved and he is alert and oriented.  Hemoglobin again dropped to the range of 7 today, we have ordered nuclear bleeding scan.  Assessment & Plan:   Active Problems:   Acute GI bleeding   GI bleed  Acute lower GI bleeding/diverticulosis: Presented with black/maroon-colored stools.  CT abdomen/pelvis was unremarkable.  CTA localized bleeding on the ascending colon but embolization was not successful as there was no significant bleeding during the procedure.  GI consulted and following.  EGD did not show any source of bleeding.  Underwent colonoscopy today with finding of pandiverticulosis, no active bleeding, nonbleeding internal hemorrhoids.  Diverticular bleeding is the most likely cause.  Hemoglobin again dropped to the range of 7, he does not have frank rectal  bleeding but some maroon-colored stool observed.  His blood pressure is stable.  We have ordered nuclear tagged scan, if it confirms bleeding, will consult IR.[name]  Acute blood loss anemia: Suspected to be from diverticular bleed.  Transfused with 2 units of packed RBC during this hospitalization.  Continue to monitor CBC.  Hemoglobin again dropped to the range of 6-7 today, being transfused with 2 units of PRBC.Total of 4 units during this hospitalization  hypotension/history of hypertension: .  Home antihypertensives are  on hold.  Patient was found to be orthostatic earlier during this hospitalization.  Became hypotensive on 01/09/2021, had to be started on fluids.  Blood pressure is fluctuating.  We will continue only as needed medications for now.  Elevated troponin/chest discomfort: Likely from supply demand ischemia from severe anemia.  Echocardiogram showed a preserved left ventricular fraction with grade 2 diastolic dysfunction.  No regional wall motion abnormality.  Continue statin.  Cardiology were following.  No plan for further work-up.  Parkinson disease/acute  encephalopathy: On Sinemet at home. There was report of intermittent confusion during this hospitalization.  Patient became acutely confused in the morning of 01/10/2021.  Most likely this is hospital-acquired delirium or secondary to anesthesia effect during the colonoscopy.  He has history of confusion/delirium secondary to anesthesia medications in the past.  As per the request of the wife, we have requested neurology consultation.  We will continue to monitor his mental status.  We will keep the room bright with day sunlight, we will attempt frequent reorientation.  Will minimize sedatives, narcotics. Started on low-dose Seroquel.   Patient mental status has already improved and he is currently at his baseline.    BPH: On  alfuzosin at home,will stop due to orthostatic hypotension  Debility/deconditioning: Patient seen by PT/OT and  recommended outpatient PT.or no follow up            DVT prophylaxis:SCD Code Status: Full Family Communication: Discussed with wife on phone on 01/12/21 Status is: Inpatient  Remains inpatient appropriate because:Hemodynamically unstable  Dispo: The patient is from: Home              Anticipated d/c is to: Home              Patient currently is not medically stable to d/c.   Difficult to place patient No     Consultants: GI  Procedures:EGD/colonoscopy  Antimicrobials:  Anti-infectives (From admission, onward)    None       Subjective:  Patient seen and examined the bedside this morning.  He was hypotensive when I entered the room.  He had an episode of hypotension last night.  He did not complain of any abdominal pain or frank rectal bleeding.  But he says  feels something funny on his chest.  He is alert and oriented.   Objective: Vitals:   01/11/21 2200 01/11/21 2230 01/12/21 0000 01/12/21 0400  BP: (!) 199/92 (!) 89/46    Pulse:      Resp:      Temp:   99.2 F (37.3 C) 98.6 F (37 C)  TempSrc:   Oral Oral  SpO2:      Weight:      Height:        Intake/Output Summary (Last 24 hours) at 01/12/2021 0745 Last data filed at 01/12/2021 0500 Gross per 24 hour  Intake --  Output 1101 ml  Net -1101 ml   Filed Weights   01/02/21 1213 01/02/21 2017 01/09/21 0714  Weight: 72.6 kg 69.6 kg 69.6 kg    Examination:  General exam: Overall comfortable, not in distress HEENT: PERRL Respiratory system:  no wheezes or crackles  Cardiovascular system: S1 & S2 heard, RRR.  Gastrointestinal system: Abdomen is nondistended, soft and nontender. Central nervous system: Alert and oriented Extremities: No edema, no clubbing ,no cyanosis Skin: No rashes, no ulcers,no icterus    Data Reviewed: I have personally reviewed following labs and imaging studies  CBC: Recent Labs  Lab 01/05/21 1821 01/06/21 0257 01/06/21 1657 01/07/21 0245 01/07/21 1538  01/10/21 0303 01/10/21 1645 01/11/21 0259 01/11/21 1655 01/12/21 0515  WBC 9.0 9.0 9.1 10.5  --   --   --   --   --   --   HGB 7.7* 7.7* 8.0* 7.8*   < > 8.8* 8.6* 10.5* 7.8* 6.7*  HCT 23.2* 23.2* 24.2* 24.0*   < > 27.1* 25.8* 31.5* 24.2* 20.9*  MCV 91.3 90.6 92.0 92.7  --   --   --   --   --   --   PLT 145* 164 170 164  --   --   --   --   --   --    < > = values in this interval not displayed.   Basic Metabolic Panel: Recent Labs  Lab 01/07/21 0245 01/09/21 0249 01/10/21 0303 01/11/21 0259  NA 138 137 138 138  K 4.5 3.6 3.7 3.6  CL 109 108 111 109  CO2 25 26 23 25   GLUCOSE 102* 89 109* 88  BUN 24* 17 15 12   CREATININE 0.99 0.97 1.07 1.06  CALCIUM 8.1* 8.2* 8.2* 8.0*  MG 2.0 2.1  --   --  PHOS 2.9 2.7  --   --    GFR: Estimated Creatinine Clearance: 62.9 mL/min (by C-G formula based on SCr of 1.06 mg/dL). Liver Function Tests: Recent Labs  Lab 01/09/21 0249  AST 18  ALT 8  ALKPHOS 48  BILITOT 0.7  PROT 5.7*  ALBUMIN 3.6   No results for input(s): LIPASE, AMYLASE in the last 168 hours. Recent Labs  Lab 01/10/21 1131  AMMONIA 20   Coagulation Profile: No results for input(s): INR, PROTIME in the last 168 hours. Cardiac Enzymes: No results for input(s): CKTOTAL, CKMB, CKMBINDEX, TROPONINI in the last 168 hours. BNP (last 3 results) No results for input(s): PROBNP in the last 8760 hours. HbA1C: No results for input(s): HGBA1C in the last 72 hours. CBG: No results for input(s): GLUCAP in the last 168 hours. Lipid Profile: No results for input(s): CHOL, HDL, LDLCALC, TRIG, CHOLHDL, LDLDIRECT in the last 72 hours.  Thyroid Function Tests: Recent Labs    01/11/21 1655  TSH 3.131   Anemia Panel: Recent Labs    01/11/21 1655  VITAMINB12 1,268*   Sepsis Labs: No results for input(s): PROCALCITON, LATICACIDVEN in the last 168 hours.  Recent Results (from the past 240 hour(s))  Resp Panel by RT-PCR (Flu A&B, Covid) Nasopharyngeal Swab     Status:  None   Collection Time: 01/02/21  3:45 PM   Specimen: Nasopharyngeal Swab; Nasopharyngeal(NP) swabs in vial transport medium  Result Value Ref Range Status   SARS Coronavirus 2 by RT PCR NEGATIVE NEGATIVE Final    Comment: (NOTE) SARS-CoV-2 target nucleic acids are NOT DETECTED.  The SARS-CoV-2 RNA is generally detectable in upper respiratory specimens during the acute phase of infection. The lowest concentration of SARS-CoV-2 viral copies this assay can detect is 138 copies/mL. A negative result does not preclude SARS-Cov-2 infection and should not be used as the sole basis for treatment or other patient management decisions. A negative result may occur with  improper specimen collection/handling, submission of specimen other than nasopharyngeal swab, presence of viral mutation(s) within the areas targeted by this assay, and inadequate number of viral copies(<138 copies/mL). A negative result must be combined with clinical observations, patient history, and epidemiological information. The expected result is Negative.  Fact Sheet for Patients:  EntrepreneurPulse.com.au  Fact Sheet for Healthcare Providers:  IncredibleEmployment.be  This test is no t yet approved or cleared by the [place] FDA and  has been authorized for detection and/or diagnosis of SARS-CoV-2 by FDA under an Emergency Use Authorization (EUA). This EUA will remain  in effect (meaning this test can be used) for the duration of the COVID-19 declaration under Section 564(b)(1) of the Act, 21 U.S.C.section 360bbb-3(b)(1), unless the authorization is terminated  or revoked sooner.       Influenza A by PCR NEGATIVE NEGATIVE Final   Influenza B by PCR NEGATIVE NEGATIVE Final    Comment: (NOTE) The Xpert Xpress SARS-CoV-2/FLU/RSV plus assay is intended as an aid in the diagnosis of influenza from Nasopharyngeal swab specimens and should not be used as a sole basis for  treatment. Nasal washings and aspirates are unacceptable for Xpert Xpress SARS-CoV-2/FLU/RSV testing.  Fact Sheet for Patients: EntrepreneurPulse.com.au  Fact Sheet for Healthcare Providers: IncredibleEmployment.be  This test is not yet approved or cleared by the [place] FDA and has been authorized for detection and/or diagnosis of SARS-CoV-2 by FDA under an Emergency Use Authorization (EUA). This EUA will remain in effect (meaning this test can be used) for the duration  of the COVID-19 declaration under Section 564(b)(1) of the Act, 21 U.S.C. section 360bbb-3(b)(1), unless the authorization is terminated or revoked.  Performed at [facility], [place] [address], [place], [place] [address]   MRSA PCR Screening     Status: None   Collection Time: 01/04/21 11:52 AM   Specimen: Nasal Mucosa; Nasopharyngeal  Result Value Ref Range Status   MRSA by PCR NEGATIVE NEGATIVE Final    Comment:        The GeneXpert MRSA Assay (FDA approved for NASAL specimens only), is one component of a comprehensive MRSA colonization surveillance program. It is not intended to diagnose MRSA infection nor to guide or monitor treatment for MRSA infections. Performed at [facility], [place] [address] [place], [place] [address]          Radiology Studies: No results found.      Scheduled Meds:  sodium chloride   Intravenous Once   sodium chloride   Intravenous Once   sodium chloride   Intravenous Once   sodium chloride   Intravenous Once   atorvastatin  40 mg Oral QPM   carbidopa-levodopa  1.5 tablet Oral TID   Chlorhexidine Gluconate Cloth  6 each Topical Daily   citalopram  20 mg Oral Daily   mouth rinse  15 mL Mouth Rinse BID   multivitamin with minerals  1 tablet Oral Daily   pantoprazole  40 mg Oral Daily   psyllium  1 packet Oral Daily   QUEtiapine  25 mg Oral QHS   timolol  1 drop Both Eyes Daily    trihexyphenidyl  1 mg Oral BID WC   Continuous Infusions:  sodium chloride        LOS: 8 days    Time spent: 35 mins.More than 50% of that time was spent in counseling and/or coordination of care.      Shelly Coss, MD [facility] P6/19/2022, 7:45 AM

## 2021-01-13 DIAGNOSIS — K922 Gastrointestinal hemorrhage, unspecified: Secondary | ICD-10-CM | POA: Diagnosis not present

## 2021-01-13 LAB — CBC
HCT: 28.3 % — ABNORMAL LOW (ref 39.0–52.0)
HCT: 27.2 % — ABNORMAL LOW (ref 39.0–52.0)
Hemoglobin: 9.6 g/dL — ABNORMAL LOW (ref 13.0–17.0)
Hemoglobin: 9.1 g/dL — ABNORMAL LOW (ref 13.0–17.0)
MCH: 30.8 pg (ref 26.0–34.0)
MCH: 30.7 pg (ref 26.0–34.0)
MCHC: 33.9 g/dL (ref 30.0–36.0)
MCHC: 33.5 g/dL (ref 30.0–36.0)
MCV: 90.7 fL (ref 80.0–100.0)
MCV: 91.9 fL (ref 80.0–100.0)
Platelets: 195 10*3/uL (ref 150–400)
Platelets: 200 10*3/uL (ref 150–400)
RBC: 3.12 MIL/uL — ABNORMAL LOW (ref 4.22–5.81)
RBC: 2.96 MIL/uL — ABNORMAL LOW (ref 4.22–5.81)
RDW: 15.2 % (ref 11.5–15.5)
RDW: 15.1 % (ref 11.5–15.5)
WBC: 11.5 10*3/uL — ABNORMAL HIGH (ref 4.0–10.5)
WBC: 12.5 10*3/uL — ABNORMAL HIGH (ref 4.0–10.5)
nRBC: 0 % (ref 0.0–0.2)
nRBC: 0 % (ref 0.0–0.2)

## 2021-01-13 LAB — BPAM RBC
Blood Product Expiration Date: 202207092359
Blood Product Expiration Date: 202207092359
ISSUE DATE / TIME: 202206191053
ISSUE DATE / TIME: 202206191716
Unit Type and Rh: 7300
Unit Type and Rh: 7300

## 2021-01-13 LAB — TYPE AND SCREEN
ABO/RH(D): B POS
Antibody Screen: NEGATIVE
Unit division: 0
Unit division: 0

## 2021-01-13 LAB — HEMOGLOBIN AND HEMATOCRIT, BLOOD
HCT: 26.3 % — ABNORMAL LOW (ref 39.0–52.0)
Hemoglobin: 8.8 g/dL — ABNORMAL LOW (ref 13.0–17.0)

## 2021-01-13 MED ORDER — IRBESARTAN 150 MG PO TABS
75.0000 mg | ORAL_TABLET | Freq: Every morning | ORAL | Status: DC
  Administered 2021-01-13 – 2021-01-16 (×4): 75 mg via ORAL
  Filled 2021-01-13 (×4): qty 1

## 2021-01-13 MED ORDER — VERAPAMIL HCL ER 120 MG PO TBCR
120.0000 mg | EXTENDED_RELEASE_TABLET | Freq: Every day | ORAL | Status: DC
  Administered 2021-01-13 – 2021-01-16 (×4): 120 mg via ORAL
  Filled 2021-01-13 (×4): qty 1

## 2021-01-13 MED ORDER — HALOPERIDOL LACTATE 5 MG/ML IJ SOLN
2.0000 mg | Freq: Once | INTRAMUSCULAR | Status: AC
  Administered 2021-01-13: 2 mg via INTRAVENOUS
  Filled 2021-01-13: qty 1

## 2021-01-13 NOTE — Progress Notes (Signed)
    BRIEF OVERNIGHT PROGRESS REPORT  Notified by RN for patient cursing, fighting, and tearing restraints. Patient is found to be verbally combative, pulling at all devices and verbally aggressive with staff, and attempting to get up despite attempts at redirection and orientation. Room lighting increased as in prior note plan. Reorientation by staff in progress. Patient refuses to answer questions to staff members. Single dose med ordered/pending.   Gershon Cull MSNA ACNPC-AG Acute Care Nurse Practitioner [place]

## 2021-01-13 NOTE — Progress Notes (Signed)
Patient appears to have bled within the past 48 hours, characterized by a drop in hemoglobin from the 8.7 range on Saturday (followed by a spurious one of 10.5) to a nadir of approximately 7 yesterday morning, prompting transfusion 2 units of packed cells yesterday, with an appropriate posttransfusion rise to the 9.4 range.  Again, interpretation is difficult because of vacillating results, but hemoglobin this morning is 9.6.  He has not had any bloody stools as far as I can tell.  Meanwhile, he had a repeat bleeding scan yesterday evening which showed probable faint activity in the same region as his previously positive bleeding scan approximately a week earlier.  However, no intervention was performed at that time.  IR has seen the patient again this morning and does not feel that intervention is necessary at the present time, which I agree with.  Meanwhile, the patient is having problems with intermittent confusion, consistent with hospital induced mild delirium.  His wife indicates that, at home prior to admission, he did not have any issues with confusion.  Recommendations:  1.  Continue to monitor hemoglobin level, by checking CBCs only (I find that the hemoglobin level on an H&H versus a CBC does not always correlate well).  2.  If the patient shows clinical evidence of further bleeding, or a significant drop in hemoglobin overnight, consider surgical consultation since it appears he is consistently bleeding from the region of the hepatic flexure.  3.  Confusion management per hospitalist team.  Cleotis Nipper, M.D. Pager [phone] If no answer or after 5 PM call [phone]

## 2021-01-13 NOTE — Progress Notes (Signed)
Pt asleep and resting at this time. VSS . Will continue to monitor.

## 2021-01-13 NOTE — Progress Notes (Addendum)
Patient gradually becoming confused. Pt wanting to get out of bed and calling the taxi service to go home.  This RN and Teacher, music attempted multiple times to reorient patient and provide diversional activity. Pt unable to comply. Haldol IV given.  No evident effect seen.  Pt continue to refuse and get out of bed. Pt became verbally inappropriate and cursing staff. Pt started kicking and trying to hit staff.  MD notified MD Adkhikari who is aware. Orders in for 4 point non violent restraints.

## 2021-01-13 NOTE — Progress Notes (Signed)
PT Cancellation Note  Patient Details Name: RESHAWN OSTLUND MRN: [id number] DOB: [birth year]   Cancelled Treatment:    Reason Eval/Treat Not Completed: Patient's level of consciousness, noted by RN that patient has become more confused and agitated. Will check back another time.   Claretha Cooper 01/13/2021, 2:11 PM [place] Pager [phone] Office [phone]

## 2021-01-13 NOTE — TOC Progression Note (Signed)
Transition of Care [facility]) - Progression Note    Patient Details  Name: [name] MRN: [id number] Date of Birth: 23-Dec-1950  Transition of Care [facility]) CM/SW Contact  Leeroy Cha, RN Phone Number: 01/13/2021, 9:21 AM  Clinical Narrative:    Assessment & Plan:   Active Problems:   Acute GI bleeding   GI bleed   Acute lower GI bleeding/diverticulosis: Presented with black/maroon-colored stools.  CT abdomen/pelvis was unremarkable.  CTA localized bleeding on the ascending colon but embolization was not successful as there was no significant bleeding during the procedure.  GI consulted and following.  EGD did not show any source of bleeding.  Underwent colonoscopy today with finding of pandiverticulosis, no active bleeding, nonbleeding internal hemorrhoids.  Diverticular bleeding is the most likely cause. Hemoglobin again dropped to the range of 7, he does not have frank rectal bleeding but some maroon-colored stool observed.  His blood pressure is stable.  We have ordered nuclear tagged scan, if it confirms bleeding, will consult IR.[name]   Acute blood loss anemia: Suspected to be from diverticular bleed.  Transfused with 2 units of packed RBC during this hospitalization.  Continue to monitor CBC.  Hemoglobin again dropped to the range of 6-7 today, being transfused with 2 units of PRBC.Total of 4 units during this hospitalization   hypotension/history of hypertension: .  Home antihypertensives are  on hold.  Patient was found to be orthostatic earlier during this hospitalization.  Became hypotensive on 01/09/2021, had to be started on fluids.  Blood pressure is fluctuating.  We will continue only as needed medications for now.   Elevated troponin/chest discomfort: Likely from supply demand ischemia from severe anemia.  Echocardiogram showed a preserved left ventricular fraction with grade 2 diastolic dysfunction.  No regional wall motion abnormality.  Continue statin.  Cardiology were  following.  No plan for further work-up.   Parkinson disease/acute  encephalopathy: On Sinemet at home. There was report of intermittent confusion during this hospitalization.  Patient became acutely confused in the morning of 01/10/2021.  Most likely this is hospital-acquired delirium or secondary to anesthesia effect during the colonoscopy.  He has history of confusion/delirium secondary to anesthesia medications in the past.  As per the request of the wife, we have requested neurology consultation.  We will continue to monitor his mental status.  We will keep the room bright with day sunlight, we will attempt frequent reorientation.  Will minimize sedatives, narcotics. Started on low-dose Seroquel.   Patient mental status has already improved and he is currently at his baseline.     BPH: On alfuzosin at home,will stop due to orthostatic hypotension   Debility/deconditioning: Patient seen by PT/OT and recommended outpatient PT.or no follow up  TOC PLAN:FOLLOWING FOR PROGRESSION AND POSSIBLE HHC NEEDS.   Expected Discharge Plan: Home/Self Care Barriers to Discharge: No Barriers Identified  Expected Discharge Plan and Services Expected Discharge Plan: Home/Self Care                                               Social Determinants of Health (SDOH) Interventions    Readmission Risk Interventions No flowsheet data found.

## 2021-01-13 NOTE — Progress Notes (Signed)
Update wife [name] who states she would like patient to have psych evaluate pt.  MD Adhikari notified and aware.

## 2021-01-13 NOTE — Progress Notes (Addendum)
Wife [name] at bedside now. Pts wife updated on necessity for restraints.  [name] voiced understanding.

## 2021-01-13 NOTE — Progress Notes (Signed)
PROGRESS NOTE    [name]  FVC:[id number] DOB: June 18, 1951 DOA: 01/02/2021 PCP: Denita Lung, MD   Chief Complain: Black stools  Brief Narrative: Patient is a 70 year old African-American male with past medical history of Parkinson's disease, BPH, hypertension, hyperlipidemia, previous GI bleed who presented to the emergency department with complaints of positive black stools.  Patient was found to be anemic in presentation.  GI was consulted after the hospitalization.  He underwent EGD without finding of any source of bleeding.  Patient underwent CTA abdomen/pelvis that localized bleeding on the colon area.  IR was consulted but ablation was not successful.  Patient also complained of chest discomfort on 01/05/2021, had mildly elevated troponin for which cardiology was consulted.  Patient underwent colonoscopy without finding of any acute bleeding, diverticulosis.  Patient became hypotensive in the evening of 01/09/2021 and had to be transferred to stepdown again.  Blood pressure was stable but he developed acute confusion, most likely hospital delirium.  Neurology consulted as per request of the family.  Mental status has  improved .  Hemoglobin again dropped to the range of 7 ton 01/12/21,but nuclear tagged scan was negative for acute bleed.  Assessment & Plan:   Active Problems:   Acute GI bleeding   GI bleed  Acute lower GI bleeding/diverticulosis: Presented with black/maroon-colored stools.  CT abdomen/pelvis was unremarkable.  CTA localized bleeding on the ascending colon but embolization was not successful as there was no significant bleeding during the procedure.  GI consulted and following.  EGD did not show any source of bleeding.  Underwent colonoscopy with finding of pandiverticulosis, no active bleeding, nonbleeding internal hemorrhoids.  Diverticular bleeding is the most likely cause.  Hemoglobin again dropped to the range of 7 on 01/12/21, he didnt have frank rectal bleeding but  some maroon-colored stool observed.   We ordered nuclear tagged scan, IR was following.  Nuclear tagged scan showed aggregation of contrast in the hepatic flexure suggesting  recurrent bleed but no active bleeding was observed on intervening catheter angiography.  We will continue to monitor CBC  Acute blood loss anemia: Suspected to be from diverticular bleed.  Transfused with 4 units of packed RBC during this hospitalization.  Continue to monitor CBC.  Hemoglobin stable in the range of 8 today.  hypotension/history of hypertension: .  Home antihypertensives are  on hold.  Patient was found to be orthostatic earlier during this hospitalization.  Became hypotensive on 01/09/2021, had to be started on fluids.  Blood pressure is is again high and home meds have been resumed at reduced dose.  We will start the full dose of antihypertensives if he remains hypertensive  Elevated troponin/chest discomfort: Likely from supply demand ischemia from severe anemia.  Echocardiogram showed a preserved left ventricular fraction with grade 2 diastolic dysfunction.  No regional wall motion abnormality.  Continue statin.  Cardiology were following.  No plan for further work-up.  Parkinson disease/acute  encephalopathy: On Sinemet at home. There was report of intermittent confusion during this hospitalization.  Patient became acutely confused in the morning of 01/10/2021.  Most likely this is hospital-acquired delirium or secondary to anesthesia effect during the colonoscopy.  He has history of confusion/delirium secondary to anesthesia medications in the past.  As per the request of the wife, we  requested neurology consultation.  We will continue to monitor his mental status.  We will keep the room bright with day sunlight, we will attempt frequent reorientation.  Will minimize sedatives, narcotics. Started on low-dose  Seroquel.   Patient mental status has  improved and he was slightly confused this morning but alert and  awake  BPH: On alfuzosin at home,will stop due to orthostatic hypotension  Debility/deconditioning: Patient seen by PT/OT and recommended outpatient PT.or no follow up            DVT prophylaxis:SCD Code Status: Full Family Communication: Discussed with wife at bedside on 01/13/21 Status is: Inpatient  Remains inpatient appropriate because:Hemodynamically unstable  Dispo: The patient is from: Home              Anticipated d/c is to: Home              Patient currently is not medically stable to d/c.   Difficult to place patient No     Consultants: GI  Procedures:EGD/colonoscopy  Antimicrobials:  Anti-infectives (From admission, onward)    None       Subjective:  Patient seen and examined at bedside this morning.  Hemodynamically stable.  No new event of bloody bowel movement.  Wife at the bedside.  He looks slightly confused today, not oriented to time but not agitated or disoriented like before.  Blood pressure is high.  Objective: Vitals:   01/13/21 0540 01/13/21 0600 01/13/21 0611 01/13/21 0630  BP: (!) 189/128 (!) 204/117 (!) 168/107 (!) 175/99  Pulse: 89 67 68 78  Resp: 19 19 14  (!) 22  Temp:      TempSrc:      SpO2: 100% 100% 100% 98%  Weight:      Height:        Intake/Output Summary (Last 24 hours) at 01/13/2021 0741 Last data filed at 01/13/2021 0523 Gross per 24 hour  Intake 1114.17 ml  Output 2501 ml  Net -1386.83 ml   Filed Weights   01/02/21 1213 01/02/21 2017 01/09/21 0714  Weight: 72.6 kg 69.6 kg 69.6 kg    Examination:  General exam: Overall comfortable, not in distress HEENT: PERRL Respiratory system:  no wheezes or crackles  Cardiovascular system: S1 & S2 heard, RRR.  Gastrointestinal system: Abdomen is nondistended, soft and nontender. Central nervous system: Alert and awake,not oriented to time Extremities: No edema, no clubbing ,no cyanosis Skin: No rashes, no ulcers,no icterus    Data Reviewed: I have personally  reviewed following labs and imaging studies  CBC: Recent Labs  Lab 01/06/21 1657 01/07/21 0245 01/07/21 1538 01/12/21 0515 01/12/21 0657 01/12/21 1706 01/12/21 2227 01/13/21 0530  WBC 9.1 10.5  --   --   --   --   --   --   HGB 8.0* 7.8*   < > 6.7* 7.3* 8.3* 9.4* 8.8*  HCT 24.2* 24.0*   < > 20.9* 22.4* 24.9* 27.7* 26.3*  MCV 92.0 92.7  --   --   --   --   --   --   PLT 170 164  --   --   --   --   --   --    < > = values in this interval not displayed.   Basic Metabolic Panel: Recent Labs  Lab 01/07/21 0245 01/09/21 0249 01/10/21 0303 01/11/21 0259  NA 138 137 138 138  K 4.5 3.6 3.7 3.6  CL 109 108 111 109  CO2 25 26 23 25   GLUCOSE 102* 89 109* 88  BUN 24* 17 15 12   CREATININE 0.99 0.97 1.07 1.06  CALCIUM 8.1* 8.2* 8.2* 8.0*  MG 2.0 2.1  --   --  PHOS 2.9 2.7  --   --    GFR: Estimated Creatinine Clearance: 62.9 mL/min (by C-G formula based on SCr of 1.06 mg/dL). Liver Function Tests: Recent Labs  Lab 01/09/21 0249  AST 18  ALT 8  ALKPHOS 48  BILITOT 0.7  PROT 5.7*  ALBUMIN 3.6   No results for input(s): LIPASE, AMYLASE in the last 168 hours. Recent Labs  Lab 01/10/21 1131  AMMONIA 20   Coagulation Profile: No results for input(s): INR, PROTIME in the last 168 hours. Cardiac Enzymes: No results for input(s): CKTOTAL, CKMB, CKMBINDEX, TROPONINI in the last 168 hours. BNP (last 3 results) No results for input(s): PROBNP in the last 8760 hours. HbA1C: No results for input(s): HGBA1C in the last 72 hours. CBG: No results for input(s): GLUCAP in the last 168 hours. Lipid Profile: No results for input(s): CHOL, HDL, LDLCALC, TRIG, CHOLHDL, LDLDIRECT in the last 72 hours.  Thyroid Function Tests: Recent Labs    01/11/21 1655  TSH 3.131   Anemia Panel: Recent Labs    01/11/21 1655  VITAMINB12 1,268*   Sepsis Labs: No results for input(s): PROCALCITON, LATICACIDVEN in the last 168 hours.  Recent Results (from the past 240 hour(s))  MRSA  PCR Screening     Status: None   Collection Time: 01/04/21 11:52 AM   Specimen: Nasal Mucosa; Nasopharyngeal  Result Value Ref Range Status   MRSA by PCR NEGATIVE NEGATIVE Final    Comment:        The GeneXpert MRSA Assay (FDA approved for NASAL specimens only), is one component of a comprehensive MRSA colonization surveillance program. It is not intended to diagnose MRSA infection nor to guide or monitor treatment for MRSA infections. Performed at [facility], [place] [address] [place], [place] [address]          Radiology Studies: NM GI Blood Loss  Result Date: 01/12/2021 CLINICAL DATA:  GI bleed.  Intermittent GI bleeding. EXAM: NUCLEAR MEDICINE GASTROINTESTINAL BLEEDING SCAN TECHNIQUE: Sequential abdominal images were obtained following intravenous administration of Tc-24m labeled red blood cells. RADIOPHARMACEUTICALS:  20.6 mCi Tc-46m pertechnetate in-vitro labeled red cells. COMPARISON:  Abdominal CTA 01/04/2021, as well as catheter angiography 01/04/2021. FINDINGS: Imaging obtained for 2 hours. There is faint contrast accumulating in the right upper quadrant, initial hour image 5. This faintly increases in intensity but does not travel along the course of the large bowel. This is at site of prior active extravasation/diverticular bleed. IMPRESSION: Faint contrast accumulation in the right upper quadrant likely at the hepatic flexure of the colon. This is at site of prior diverticular bleed on CTA, and is concerning for persistent or recurrent bleed. No active bleeding was demonstrated on intervening catheter angiography. These results will be called to the ordering clinician or representative by the Radiologist Assistant, and communication documented in the PACS or [employer]. Electronically Signed   By: Keith Rake M.D.   On: 01/12/2021 16:52        Scheduled Meds:  sodium chloride   Intravenous Once   sodium chloride   Intravenous Once   sodium  chloride   Intravenous Once   sodium chloride   Intravenous Once   atorvastatin  40 mg Oral QPM   carbidopa-levodopa  1.5 tablet Oral TID   Chlorhexidine Gluconate Cloth  6 each Topical Daily   citalopram  20 mg Oral Daily   irbesartan  75 mg Oral q morning   mouth rinse  15 mL Mouth Rinse BID  multivitamin with minerals  1 tablet Oral Daily   pantoprazole  40 mg Oral Daily   psyllium  1 packet Oral Daily   QUEtiapine  25 mg Oral QHS   timolol  1 drop Both Eyes Daily   trihexyphenidyl  1 mg Oral BID WC   verapamil  120 mg Oral Daily   Continuous Infusions:  sodium chloride 100 mL/hr at 01/13/21 0419      LOS: 9 days    Time spent: 35 mins.More than 50% of that time was spent in counseling and/or coordination of care.      Shelly Coss, MD [facility] P6/20/2022, 7:41 AM

## 2021-01-13 NOTE — Consult Note (Signed)
Chief Complaint: GI Bleed. Request is for evaluation for arteriogram with possible intervention  Referring Physician(s): Dr. Loni Muse,. Adhikari  Supervising Physician: Ruthann Cancer  Patient Status: [facility] - In-pt  History of Present Illness: [name] is a 70 y.o. male History of Parkinson disease, BPH, HTN, HLD. Presented to ED at [facility] from GI office on 6.9.22 with hematochezia and  near syncopal episode. CTA from 6.11.22 reads Positive for active diverticular bleed in the ascending colon just proximal to the hepatic flexure. Angiogram performed by IR on  6.11 IR  Angiogram by Dr. Earleen Newport  Status post ultrasound guided access right common femoral artery for SMA angiogram, super selective angiogram the right colic arteries of interest in attempt to isolate the source of recent lower GI hemorrhage. No embolization was performed. Due to ongoing intermittent GI bleeding a NM GI Blood Loss study was performed on 6.19.22. Impression reads Faint contrast accumulation in the right upper quadrant likely at the hepatic flexure of the colon. This is at site of prior diverticular bleed on CTA, and is concerning for persistent or recurrent bleed. No active bleeding was demonstrated on intervening catheter angiography. Team is requesting evaluation for possible arteriogram  Past Medical History:  Diagnosis Date   Allergy    Arthritis    BPH (benign prostatic hyperplasia)    Complication of anesthesia    Pt. stated he had a reaction that ended in him requiring urinary cath placement   Diverticulosis    GERD (gastroesophageal reflux disease)    esophageal spasms   Glaucoma    Gout    Head injury, closed, with concussion    Hepatitis C    chronic - Has been treated with Harvoni   HLD (hyperlipidemia)    statin intolerant (Crestor & Simvastatin) - Taking Livalo 1mg  / week   Hypertension    Parkinson's disease ([place])    Plantar fasciitis    right   PVD (peripheral vascular disease) ([place])    With no  claudication; only mild abdominal aortic atherosclerosis noted on ultrasound.   Thoracic ascending aortic aneurysm (HCC)    4.2 cm ascending TAA 09/2016 CT, 1 yr f/u rec   Ulcer     Past Surgical History:  Procedure Laterality Date   BUNIONECTOMY WITH WEIL OSTEOTOMY Right 11/02/2019   Procedure: Right Foot Lapidus, Modified McBride Bunionectomy,  Hallux Akin Osteotomy;  Surgeon: Wylene Simmer, MD;  Location: [place];  Service: Orthopedics;  Laterality: Right;   CARDIAC CATHETERIZATION  2005   30% Cx. Dr. Melvern Banker   cataract surgery Left 11/05/2014   COLONOSCOPY     COLONOSCOPY N/A 01/09/2021   Procedure: COLONOSCOPY;  Surgeon: Ronnette Juniper, MD;  Location: [facility];  Service: Gastroenterology;  Laterality: N/A;   ESOPHAGOGASTRODUODENOSCOPY N/A 05/02/2015   Procedure: ESOPHAGOGASTRODUODENOSCOPY (EGD);  Surgeon: Ronald Lobo, MD;  Location: [facility];  Service: Endoscopy;  Laterality: N/A;   ESOPHAGOGASTRODUODENOSCOPY  05/02/2015   no source of pt chest pain endoscopically evident. small hiatal hernia.   ESOPHAGOGASTRODUODENOSCOPY (EGD) WITH PROPOFOL N/A 01/02/2021   Procedure: ESOPHAGOGASTRODUODENOSCOPY (EGD) WITH PROPOFOL;  Surgeon: Clarene Essex, MD;  Location: [facility];  Service: Endoscopy;  Laterality: N/A;   EYE SURGERY     HARDWARE REMOVAL Right 11/02/2019   Procedure: Second Metatarsal Removal of Deep Implant and Rotational Osteotomy;  Surgeon: Wylene Simmer, MD;  Location: [place];  Service: Orthopedics;  Laterality: Right;   IR ANGIOGRAM SELECTIVE EACH ADDITIONAL VESSEL  01/04/2021   IR ANGIOGRAM SELECTIVE  EACH ADDITIONAL VESSEL  01/04/2021   IR ANGIOGRAM VISCERAL SELECTIVE  01/04/2021   IR US GUIDE VASC ACCESS RIGHT  01/04/2021   MEMBRANE PEEL Left 03/14/2014   Procedure: MEMBRANE PEEL; ENDOLASER;  Surgeon: Hurman Horn, MD;  Location: [place];  Service: Ophthalmology;  Laterality: Left;   NM MYOVIEW LTD  2011   Neg Ischemia or infarct.   NM MYOVIEW  LTD  10/2015   LOW RISK. Small, fixed basal lateral defect - likely diaphragmatic attenuation. EF 69%   PARS PLANA VITRECTOMY Left 03/14/2014   Procedure: PARS PLANA VITRECTOMY WITH 25 GAUGE;  Surgeon: Hurman Horn, MD;  Location: [place];  Service: Ophthalmology;  Laterality: Left;   TONSILLECTOMY     TRANSTHORACIC ECHOCARDIOGRAM  2013   Normal EF. No significant Valve Disease   UPPER GASTROINTESTINAL ENDOSCOPY     WEIL OSTEOTOMY Right 09/01/2017   Procedure: RIGHT GREAT TOE CHEVRON AND WEIL OSTEOTOMY 2ND METATARSAL;  Surgeon: Newt Minion, MD;  Location: [place];  Service: Orthopedics;  Laterality: Right;    Allergies: Patient has no known allergies.  Medications: Prior to Admission medications   Medication Sig Start Date End Date Taking? Authorizing Provider  acetaminophen (TYLENOL) 500 MG tablet Take 500 mg by mouth every 6 (six) hours as needed for mild pain, fever or headache.   Yes [provider]  alfuzosin (UROXATRAL) 10 MG 24 hr tablet TAKE 1 TABLET BY MOUTH ONCE A DAY Patient taking differently: Take 10 mg by mouth daily. 11/28/20 11/28/21 Yes MacDiarmid, Nicki Reaper, MD  bismuth subsalicylate (PEPTO BISMOL) 262 MG chewable tablet Chew 524 mg by mouth as needed for diarrhea or loose stools.   Yes [provider]  carbidopa-levodopa (SINEMET IR) 25-100 MG tablet TAKE 1 AND 1/2 TABLETS BY MOUTH THREE TIMES DAILY Patient taking differently: Take 1.5 tablets by mouth 3 (three) times daily. 08/21/20 08/21/21 Yes Garvin Fila, MD  citalopram (CELEXA) 40 MG tablet TAKE 1 TABLET BY MOUTH ONCE A DAY Patient taking differently: Take 20 mg by mouth daily. 09/10/20 09/10/21 Yes Denita Lung, MD  docusate sodium (COLACE) 100 MG capsule Take 200 mg by mouth daily.   Yes [provider]  irbesartan (AVAPRO) 300 MG tablet TAKE 1 TABLET BY MOUTH EVERY MORNING Patient taking differently: Take 300 mg by mouth every morning. 03/14/20 03/14/21 Yes [name], MD  isosorbide  mononitrate (IMDUR) 30 MG 24 hr tablet Take 1 tablet (30 mg total) by mouth daily.  (Take 1/2 hour after taking tylenol) 12/24/20 03/24/21 Yes [name], MD  L-Methylfolate-B12-B6-B2 (CEREFOLIN) 12-25-48-5 MG TABS Take 1 tablet by mouth daily. 10/01/20  Yes Garvin Fila, MD  Multiple Vitamin (MULTIVITAMIN WITH MINERALS) TABS tablet Take 1 tablet by mouth daily.   Yes [provider]  nitroGLYCERIN (NITROSTAT) 0.4 MG SL tablet Place 1 tablet (0.4 mg total) under the tongue every 5 (five) minutes as needed for chest pain. 11/28/20  Yes [name], MD  pantoprazole (PROTONIX) 40 MG tablet Take 40 mg by mouth 2 (two) times daily. 06/17/15  Yes [provider]  simvastatin (ZOCOR) 40 MG tablet TAKE 1 TABLET BY MOUTH AT BEDTIME Patient taking differently: Take 40 mg by mouth at bedtime. 08/22/20 08/22/21 Yes Denita Lung, MD  timolol (TIMOPTIC) 0.5 % ophthalmic solution Place 1 drop into both eyes daily.   Yes [provider]  trihexyphenidyl (ARTANE) 2 MG tablet TAKE 1/2 TABLET BY MOUTH 2 TIMES DAILY WITH A MEAL Patient  taking differently: Take 1 mg by mouth 2 (two) times daily with a meal. 10/01/20 10/01/21 Yes Sethi, Lucy Antigua, MD  verapamil (CALAN-SR) 240 MG CR tablet TAKE 1 TABLET BY MOUTH AT BEDTIME. Patient taking differently: Take 240 mg by mouth at bedtime. 02/08/20 02/19/21 Yes [name], MD  VITAMIN D PO Take 1 capsule by mouth daily.   Yes [provider]  meloxicam (MOBIC) 15 MG tablet Take 1 tablet by mouth with a meal once daily for 14 days. Patient not taking: Reported on 01/02/2021 12/19/20     metoprolol tartrate (LOPRESSOR) 50 MG tablet Take 2 tablets (100 mg total) by mouth once for 1 dose. TAKE TWO HOURS PRIOR TO  SCHEDULE CARDIAC TEST 12/24/20 12/25/20  [name], MD  predniSONE (DELTASONE) 10 MG tablet Take 3 tablets by mouth for 3 days, take 2 tablets for 2 days then take 1 tablet for 1 day 12/26/20        Family History  Problem  Relation Age of Onset   Heart disease Mother    Cancer Paternal Grandfather    Cancer Sister     Social History   Socioeconomic History   Marital status: Married    Spouse name: barbra gen   Number of children: 3   Years of education: AS   Highest education level: Not on file  Occupational History   Occupation: self employed    Employer: [employer]: 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  Tobacco Use   Smoking status: Former    Pack years: 0.00    Types: Cigars    Quit date: 07/27/1988    Years since quitting: 32.4   Smokeless tobacco: Never  Vaping Use   Vaping Use: Never used  Substance and Sexual Activity   Alcohol use: Yes    Alcohol/week: 3.0 standard drinks    Types: 1 Glasses of wine, 1 Cans of beer, 1 Shots of liquor per week    Comment: occasional   Drug use: No   Sexual activity: Yes  Other Topics Concern   Not on file  Social History Narrative   Lives with wife    Right handed   Drinks 1-2 cups caffeine daily   Social Determinants of Health   Financial Resource Strain: Not on file  Food Insecurity: Not on file  Transportation Needs: Not on file  Physical Activity: Not on file  Stress: Not on file  Social Connections: Not on file    Review of Systems: A 12 point ROS discussed and pertinent positives are indicated in the HPI above.  All other systems are negative.  Review of Systems  Constitutional:  Negative for fever.  HENT:  Negative for congestion.   Respiratory:  Negative for cough and shortness of breath.   Cardiovascular:  Negative for chest pain.  Gastrointestinal:  Negative for abdominal pain.  Neurological:  Negative for headaches.  Psychiatric/Behavioral:  Positive for confusion. Negative for behavioral problems.    Vital Signs: BP (!) 169/149 (BP Location: Left Arm)   Pulse (!) 118   Temp 98 F (36.7 C) (Oral)   Resp 16   Ht 5\' 10"  (1.778 m)   Wt 153 lb 7 oz (69.6 kg)   SpO2 99%   BMI 22.02 kg/m   Physical Exam Vitals and nursing  note reviewed.  Constitutional:      Appearance: He is well-developed.  HENT:     Head: Normocephalic.  Pulmonary:     Effort: Pulmonary effort is  normal.  Musculoskeletal:        General: Normal range of motion.     Cervical back: Normal range of motion.  Skin:    General: Skin is dry.  Neurological:     Mental Status: He is alert. He is disoriented.    Imaging: NM GI Blood Loss  Result Date: 01/12/2021 CLINICAL DATA:  GI bleed.  Intermittent GI bleeding. EXAM: NUCLEAR MEDICINE GASTROINTESTINAL BLEEDING SCAN TECHNIQUE: Sequential abdominal images were obtained following intravenous administration of Tc-39m labeled red blood cells. RADIOPHARMACEUTICALS:  20.6 mCi Tc-91m pertechnetate in-vitro labeled red cells. COMPARISON:  Abdominal CTA 01/04/2021, as well as catheter angiography 01/04/2021. FINDINGS: Imaging obtained for 2 hours. There is faint contrast accumulating in the right upper quadrant, initial hour image 5. This faintly increases in intensity but does not travel along the course of the large bowel. This is at site of prior active extravasation/diverticular bleed. IMPRESSION: Faint contrast accumulation in the right upper quadrant likely at the hepatic flexure of the colon. This is at site of prior diverticular bleed on CTA, and is concerning for persistent or recurrent bleed. No active bleeding was demonstrated on intervening catheter angiography. These results will be called to the ordering clinician or representative by the Radiologist Assistant, and communication documented in the PACS or [employer]. Electronically Signed   By: Keith Rake M.D.   On: 01/12/2021 16:52   IR Angiogram Visceral Selective  Result Date: 01/04/2021 INDICATION: 70 year old male with a history of lower GI hemorrhage, isolated to the ascending colon on recent CT angiogram, presents for mesenteric angiogram and possible embolization EXAM: ULTRASOUND-GUIDED ACCESS RIGHT COMMON FEMORAL ARTERY  MESENTERIC ANGIOGRAM INVOLVING SMA, SUB SELECTION RIGHT COLIC ARTERY, AND SUPER SELECTIVE ANGIOGRAM OF TERMINAL RIGHT COLIC ARTERY BRANCHES EXOSEAL FOR HEMOSTASIS MEDICATIONS: None ANESTHESIA/SEDATION: Moderate (conscious) sedation was employed during this procedure. A total of Versed 1.0 mg and Fentanyl 25 mcg was administered intravenously. Moderate Sedation Time: 39 minutes. The patient's level of consciousness and vital signs were monitored continuously by radiology nursing throughout the procedure under my direct supervision. CONTRAST:  80 cc FLUOROSCOPY TIME:  Fluoroscopy Time: 5 minutes 36 seconds (2,325 mGy). COMPLICATIONS: None PROCEDURE: Informed consent was obtained from the patient following explanation of the procedure, risks, benefits and alternatives. The patient understands, agrees and consents for the procedure. All questions were addressed. A time out was performed prior to the initiation of the procedure. Maximal barrier sterile technique utilized including caps, mask, sterile gowns, sterile gloves, large sterile drape, hand hygiene, and Betadine prep. Ultrasound survey of the right inguinal region was performed with images stored and sent to PACs, confirming patency of the vessel. A micropuncture needle was used access the right common femoral artery under ultrasound. With excellent arterial blood flow returned, and an .018 micro wire was passed through the needle, observed enter the abdominal aorta under fluoroscopy. The needle was removed, and a micropuncture sheath was placed over the wire. The inner dilator and wire were removed, and an 035 Bentson wire was advanced under fluoroscopy into the abdominal aorta. The sheath was removed and a standard 5 [place] vascular sheath was placed. The dilator was removed and the sheath was flushed. C2 Cobra catheter advanced on the Bentson wire. Cobra catheter was used to select the SMA. Angiogram was performed. We attempted to advance the C2 catheter on a  standard Glidewire, which was unable given the tortuous SMA takeoff. A coaxial high-flow Renegade 135 cm microcatheter was then advanced on 014 fathom wire into the  ileocolic artery. Angiogram was performed. The right colic artery was selected. Angiogram was performed. The microcatheter was then used to select terminal branches of the right colic artery independently, with angiogram performed at each artery in multiple obliquities using both breath hold and free breathing technique. Ultimately, we could not isolate the site of hemorrhage correlating to the findings on the CT angiogram as there was no extravasation of count tract, shunting, or angio dysplasia that was target oval for empiric embolization. All catheters wires were removed and Exoseal was deployed for hemostasis. Patient tolerated the procedure well and remained hemodynamically stable throughout. No complications were encountered and no significant blood loss. IMPRESSION: Status post ultrasound guided access right common femoral artery for SMA angiogram, super selective angiogram the right colic arteries of interest in attempt to isolate the source of recent lower GI hemorrhage. No embolization was performed. ExoSeal for hemostasis. Signed, Dulcy Fanny. Dellia Nims, RPVI [facility] Electronically Signed   By: Corrie Mckusick D.O.   On: 01/04/2021 13:01   IR Angiogram Selective Each Additional Vessel  Result Date: 01/04/2021 INDICATION: 70 year old male with a history of lower GI hemorrhage, isolated to the ascending colon on recent CT angiogram, presents for mesenteric angiogram and possible embolization EXAM: ULTRASOUND-GUIDED ACCESS RIGHT COMMON FEMORAL ARTERY MESENTERIC ANGIOGRAM INVOLVING SMA, SUB SELECTION RIGHT COLIC ARTERY, AND SUPER SELECTIVE ANGIOGRAM OF TERMINAL RIGHT COLIC ARTERY BRANCHES EXOSEAL FOR HEMOSTASIS MEDICATIONS: None ANESTHESIA/SEDATION: Moderate (conscious) sedation was  employed during this procedure. A total of Versed 1.0 mg and Fentanyl 25 mcg was administered intravenously. Moderate Sedation Time: 39 minutes. The patient's level of consciousness and vital signs were monitored continuously by radiology nursing throughout the procedure under my direct supervision. CONTRAST:  80 cc FLUOROSCOPY TIME:  Fluoroscopy Time: 5 minutes 36 seconds (2,325 mGy). COMPLICATIONS: None PROCEDURE: Informed consent was obtained from the patient following explanation of the procedure, risks, benefits and alternatives. The patient understands, agrees and consents for the procedure. All questions were addressed. A time out was performed prior to the initiation of the procedure. Maximal barrier sterile technique utilized including caps, mask, sterile gowns, sterile gloves, large sterile drape, hand hygiene, and Betadine prep. Ultrasound survey of the right inguinal region was performed with images stored and sent to PACs, confirming patency of the vessel. A micropuncture needle was used access the right common femoral artery under ultrasound. With excellent arterial blood flow returned, and an .018 micro wire was passed through the needle, observed enter the abdominal aorta under fluoroscopy. The needle was removed, and a micropuncture sheath was placed over the wire. The inner dilator and wire were removed, and an 035 Bentson wire was advanced under fluoroscopy into the abdominal aorta. The sheath was removed and a standard 5 [place] vascular sheath was placed. The dilator was removed and the sheath was flushed. C2 Cobra catheter advanced on the Bentson wire. Cobra catheter was used to select the SMA. Angiogram was performed. We attempted to advance the C2 catheter on a standard Glidewire, which was unable given the tortuous SMA takeoff. A coaxial high-flow Renegade 135 cm microcatheter was then advanced on 014 fathom wire into the ileocolic artery. Angiogram was performed. The right colic artery was  selected. Angiogram was performed. The microcatheter was then used to select terminal branches of the right colic artery independently, with angiogram performed at each artery in multiple obliquities using both breath hold and free breathing technique. Ultimately, we could not isolate the site of hemorrhage correlating to  the findings on the CT angiogram as there was no extravasation of count tract, shunting, or angio dysplasia that was target oval for empiric embolization. All catheters wires were removed and Exoseal was deployed for hemostasis. Patient tolerated the procedure well and remained hemodynamically stable throughout. No complications were encountered and no significant blood loss. IMPRESSION: Status post ultrasound guided access right common femoral artery for SMA angiogram, super selective angiogram the right colic arteries of interest in attempt to isolate the source of recent lower GI hemorrhage. No embolization was performed. ExoSeal for hemostasis. Signed, Dulcy Fanny. Dellia Nims, RPVI [facility] Radiology Electronically Signed   By: Corrie Mckusick D.O.   On: 01/04/2021 13:01   IR Angiogram Selective Each Additional Vessel  Result Date: 01/04/2021 INDICATION: 70 year old male with a history of lower GI hemorrhage, isolated to the ascending colon on recent CT angiogram, presents for mesenteric angiogram and possible embolization EXAM: ULTRASOUND-GUIDED ACCESS RIGHT COMMON FEMORAL ARTERY MESENTERIC ANGIOGRAM INVOLVING SMA, SUB SELECTION RIGHT COLIC ARTERY, AND SUPER SELECTIVE ANGIOGRAM OF TERMINAL RIGHT COLIC ARTERY BRANCHES EXOSEAL FOR HEMOSTASIS MEDICATIONS: None ANESTHESIA/SEDATION: Moderate (conscious) sedation was employed during this procedure. A total of Versed 1.0 mg and Fentanyl 25 mcg was administered intravenously. Moderate Sedation Time: 39 minutes. The patient's level of consciousness and vital signs were monitored continuously by  radiology nursing throughout the procedure under my direct supervision. CONTRAST:  80 cc FLUOROSCOPY TIME:  Fluoroscopy Time: 5 minutes 36 seconds (2,325 mGy). COMPLICATIONS: None PROCEDURE: Informed consent was obtained from the patient following explanation of the procedure, risks, benefits and alternatives. The patient understands, agrees and consents for the procedure. All questions were addressed. A time out was performed prior to the initiation of the procedure. Maximal barrier sterile technique utilized including caps, mask, sterile gowns, sterile gloves, large sterile drape, hand hygiene, and Betadine prep. Ultrasound survey of the right inguinal region was performed with images stored and sent to PACs, confirming patency of the vessel. A micropuncture needle was used access the right common femoral artery under ultrasound. With excellent arterial blood flow returned, and an .018 micro wire was passed through the needle, observed enter the abdominal aorta under fluoroscopy. The needle was removed, and a micropuncture sheath was placed over the wire. The inner dilator and wire were removed, and an 035 Bentson wire was advanced under fluoroscopy into the abdominal aorta. The sheath was removed and a standard 5 [place] vascular sheath was placed. The dilator was removed and the sheath was flushed. C2 Cobra catheter advanced on the Bentson wire. Cobra catheter was used to select the SMA. Angiogram was performed. We attempted to advance the C2 catheter on a standard Glidewire, which was unable given the tortuous SMA takeoff. A coaxial high-flow Renegade 135 cm microcatheter was then advanced on 014 fathom wire into the ileocolic artery. Angiogram was performed. The right colic artery was selected. Angiogram was performed. The microcatheter was then used to select terminal branches of the right colic artery independently, with angiogram performed at each artery in multiple obliquities using both breath hold and  free breathing technique. Ultimately, we could not isolate the site of hemorrhage correlating to the findings on the CT angiogram as there was no extravasation of count tract, shunting, or angio dysplasia that was target oval for empiric embolization. All catheters wires were removed and Exoseal was deployed for hemostasis. Patient tolerated the procedure well and remained hemodynamically stable throughout. No complications were encountered and no significant blood loss. IMPRESSION: Status  post ultrasound guided access right common femoral artery for SMA angiogram, super selective angiogram the right colic arteries of interest in attempt to isolate the source of recent lower GI hemorrhage. No embolization was performed. ExoSeal for hemostasis. Signed, Dulcy Fanny. Dellia Nims, RPVI [facility] Radiology Electronically Signed   By: Corrie Mckusick D.O.   On: 01/04/2021 13:01   IR US Guide Vasc Access Right  Result Date: 01/04/2021 INDICATION: 70 year old male with a history of lower GI hemorrhage, isolated to the ascending colon on recent CT angiogram, presents for mesenteric angiogram and possible embolization EXAM: ULTRASOUND-GUIDED ACCESS RIGHT COMMON FEMORAL ARTERY MESENTERIC ANGIOGRAM INVOLVING SMA, SUB SELECTION RIGHT COLIC ARTERY, AND SUPER SELECTIVE ANGIOGRAM OF TERMINAL RIGHT COLIC ARTERY BRANCHES EXOSEAL FOR HEMOSTASIS MEDICATIONS: None ANESTHESIA/SEDATION: Moderate (conscious) sedation was employed during this procedure. A total of Versed 1.0 mg and Fentanyl 25 mcg was administered intravenously. Moderate Sedation Time: 39 minutes. The patient's level of consciousness and vital signs were monitored continuously by radiology nursing throughout the procedure under my direct supervision. CONTRAST:  80 cc FLUOROSCOPY TIME:  Fluoroscopy Time: 5 minutes 36 seconds (2,325 mGy). COMPLICATIONS: None PROCEDURE: Informed consent was obtained from the patient following  explanation of the procedure, risks, benefits and alternatives. The patient understands, agrees and consents for the procedure. All questions were addressed. A time out was performed prior to the initiation of the procedure. Maximal barrier sterile technique utilized including caps, mask, sterile gowns, sterile gloves, large sterile drape, hand hygiene, and Betadine prep. Ultrasound survey of the right inguinal region was performed with images stored and sent to PACs, confirming patency of the vessel. A micropuncture needle was used access the right common femoral artery under ultrasound. With excellent arterial blood flow returned, and an .018 micro wire was passed through the needle, observed enter the abdominal aorta under fluoroscopy. The needle was removed, and a micropuncture sheath was placed over the wire. The inner dilator and wire were removed, and an 035 Bentson wire was advanced under fluoroscopy into the abdominal aorta. The sheath was removed and a standard 5 [place] vascular sheath was placed. The dilator was removed and the sheath was flushed. C2 Cobra catheter advanced on the Bentson wire. Cobra catheter was used to select the SMA. Angiogram was performed. We attempted to advance the C2 catheter on a standard Glidewire, which was unable given the tortuous SMA takeoff. A coaxial high-flow Renegade 135 cm microcatheter was then advanced on 014 fathom wire into the ileocolic artery. Angiogram was performed. The right colic artery was selected. Angiogram was performed. The microcatheter was then used to select terminal branches of the right colic artery independently, with angiogram performed at each artery in multiple obliquities using both breath hold and free breathing technique. Ultimately, we could not isolate the site of hemorrhage correlating to the findings on the CT angiogram as there was no extravasation of count tract, shunting, or angio dysplasia that was target oval for empiric embolization.  All catheters wires were removed and Exoseal was deployed for hemostasis. Patient tolerated the procedure well and remained hemodynamically stable throughout. No complications were encountered and no significant blood loss. IMPRESSION: Status post ultrasound guided access right common femoral artery for SMA angiogram, super selective angiogram the right colic arteries of interest in attempt to isolate the source of recent lower GI hemorrhage. No embolization was performed. ExoSeal for hemostasis. Signed, Dulcy Fanny. Dellia Nims, [facility] Radiology Electronically Signed   By: York Cerise  Earleen Newport D.O.   On: 01/04/2021 13:01   ECHOCARDIOGRAM COMPLETE  Result Date: 01/07/2021    ECHOCARDIOGRAM REPORT   Patient Name:   [name] Date of Exam: 01/07/2021 Medical Rec #:  [id number]       Height:       70.0 in Accession #:    [id number]      Weight:       153.4 lb Date of Birth:  1951-06-25       BSA:          1.865 m Patient Age:    71 years        BP:           109/60 mmHg Patient Gender: M               HR:           75 bpm. Exam Location:  Inpatient Procedure: 2D Echo, Cardiac Doppler and Color Doppler Indications:    Chest pain, elevated troponin  History:        Patient has prior history of Echocardiogram examinations, most                 recent 01/20/2012. Signs/Symptoms:Chest Pain; Risk                 Factors:Dyslipidemia and Hypertension. 01/20/2012 stress echo.  Sonographer:    Luisa Hart RDCS Referring Phys: Dike  1. Left ventricular ejection fraction, by estimation, is 60 to 65%. The left ventricle has normal function. The left ventricle has no regional wall motion abnormalities. Left ventricular diastolic parameters are consistent with Grade II diastolic dysfunction (pseudonormalization).  2. Right ventricular systolic function is normal. The right ventricular size is mildly enlarged. Tricuspid regurgitation signal is inadequate  for assessing PA pressure.  3. Left atrial size was moderately dilated.  4. The mitral valve is normal in structure. No evidence of mitral valve regurgitation. No evidence of mitral stenosis.  5. The aortic valve is tricuspid. Aortic valve regurgitation is trivial. Mild to moderate aortic valve sclerosis/calcification is present, without any evidence of aortic stenosis.  6. The inferior vena cava is normal in size with greater than 50% respiratory variability, suggesting right atrial pressure of 3 mmHg. FINDINGS  Left Ventricle: Left ventricular ejection fraction, by estimation, is 60 to 65%. The left ventricle has normal function. The left ventricle has no regional wall motion abnormalities. The left ventricular internal cavity size was normal in size. There is  no left ventricular hypertrophy. Left ventricular diastolic parameters are consistent with Grade II diastolic dysfunction (pseudonormalization). Right Ventricle: The right ventricular size is mildly enlarged.Right ventricular systolic function is normal. Tricuspid regurgitation signal is inadequate for assessing PA pressure. The tricuspid regurgitant velocity is 2.33 m/s, and with an assumed right atrial pressure of 3 mmHg, the estimated right ventricular systolic pressure is 83.6 mmHg. Left Atrium: Left atrial size was moderately dilated. Right Atrium: Right atrial size was normal in size. Pericardium: Trivial pericardial effusion is present. Mitral Valve: The mitral valve is normal in structure. No evidence of mitral valve regurgitation. No evidence of mitral valve stenosis. Tricuspid Valve: The tricuspid valve is normal in structure. Tricuspid valve regurgitation is mild . No evidence of tricuspid stenosis. Aortic Valve: The aortic valve is tricuspid. Aortic valve regurgitation is trivial. Aortic regurgitation PHT measures 1040 msec. Mild to moderate aortic valve sclerosis/calcification is present, without any evidence of aortic stenosis. Aortic valve  mean gradient measures 4.5 mmHg.  Aortic valve peak gradient measures 8.0 mmHg. Aortic valve area, by VTI measures 2.71 cm. Pulmonic Valve: The pulmonic valve was normal in structure. Pulmonic valve regurgitation is trivial. No evidence of pulmonic stenosis. Aorta: The aortic root is normal in size and structure. Venous: The inferior vena cava is normal in size with greater than 50% respiratory variability, suggesting right atrial pressure of 3 mmHg. IAS/Shunts: No atrial level shunt detected by color flow Doppler.  LEFT VENTRICLE PLAX 2D LVIDd:         4.70 cm     Diastology LVIDs:         2.70 cm     LV e' medial:    6.09 cm/s LV PW:         1.10 cm     LV E/e' medial:  13.6 LV IVS:        1.00 cm     LV e' lateral:   7.51 cm/s LVOT diam:     2.30 cm     LV E/e' lateral: 11.0 LV SV:         84 LV SV Index:   45 LVOT Area:     4.15 cm  LV Volumes (MOD) LV vol d, MOD A2C: 48.9 ml LV vol d, MOD A4C: 94.0 ml LV vol s, MOD A2C: 16.9 ml LV vol s, MOD A4C: 27.6 ml LV SV MOD A2C:     32.0 ml LV SV MOD A4C:     94.0 ml LV SV MOD BP:      47.7 ml RIGHT VENTRICLE RV Basal diam:  4.40 cm RV Mid diam:    3.20 cm RV S prime:     16.00 cm/s TAPSE (M-mode): 2.1 cm LEFT ATRIUM              Index       RIGHT ATRIUM           Index LA diam:        3.80 cm  2.04 cm/m  RA Area:     17.60 cm LA Vol (A2C):   141.0 ml 75.60 ml/m RA Volume:   47.20 ml  25.31 ml/m LA Vol (A4C):   65.4 ml  35.07 ml/m LA Biplane Vol: 105.0 ml 56.30 ml/m  AORTIC VALVE                   PULMONIC VALVE AV Area (Vmax):    2.61 cm    PV Vmax:       0.84 m/s AV Area (Vmean):   2.48 cm    PV Vmean:      67.800 cm/s AV Area (VTI):     2.71 cm    PV VTI:        0.222 m AV Vmax:           141.00 cm/s PV Peak grad:  2.8 mmHg AV Vmean:          96.000 cm/s PV Mean grad:  2.0 mmHg AV VTI:            0.312 m AV Peak Grad:      8.0 mmHg AV Mean Grad:      4.5 mmHg LVOT Vmax:         88.60 cm/s LVOT Vmean:        57.400 cm/s LVOT VTI:          0.203 m LVOT/AV VTI  ratio: 0.65 AI PHT:  1040 msec  AORTA Ao Root diam: 3.60 cm Ao Asc diam:  3.10 cm MITRAL VALVE               TRICUSPID VALVE MV Area (PHT): 2.63 cm    TR Peak grad:   21.7 mmHg MV Decel Time: 288 msec    TR Vmax:        233.00 cm/s MV E velocity: 82.60 cm/s MV A velocity: 69.50 cm/s  SHUNTS MV E/A ratio:  1.19        Systemic VTI:  0.20 m                            Systemic Diam: 2.30 cm Kirk Ruths MD Electronically signed by Kirk Ruths MD Signature Date/Time: 01/07/2021/11:42:42 AM    Final    OCT, Retina - OU - Both Eyes  Result Date: 01/02/2021 Right Eye Quality was good. Scan locations included subfoveal. Central Foveal Thickness: 205. Findings include outer retinal atrophy, central retinal atrophy. Left Eye Quality was good. Scan locations included subfoveal. Central Foveal Thickness: 381. Progression has been stable. Notes Diffuse macular atrophy right eye no interval change Mild diffuse macular thickening left eye with no active CME.  No change in foveal elevation over the last 2 years. Minor CME superonasal, but this is old and involutional and unchanged for years with no encroachment to center of the fovea  Color Fundus Photography Optos - OU - Both Eyes  Result Date: 01/02/2021 Right Eye Progression has been stable. Notes OD with cloudy media secondary to corneal opacity, striae from corneal edema Retina attached, optic nerve atrophy good PRP peripheral OS, mild temporal optic atrophy. 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, no active macular edema  VAS US CAROTID  Result Date: 01/02/2021 Carotid Arterial Duplex Study Patient Name:  [name]  Date of Exam:   01/01/2021 Medical Rec #: [id number]        Accession #:    [id number] Date of Birth: [birth year]        Patient Gender: M Patient Age:   89Y Exam Location:  [facility] Procedure:      VAS US CAROTID Referring Phys: [id number] DAVID W HARDING --------------------------------------------------------------------------------  Indications:        Patient reports intermittent tingling sensations in the left                    jaw area, across the left chest area at surround the front                    area of the left knee. Symptoms do not always come in all                    areas at the same time. These symptoms have been going on for                    one to two months. He also states he has been having                    headaches for the past week but relates it to a new                    medication he is taking (could not recall the name). 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 Lightheadedness when he has to look                    over the right shoulder while driving. He denies any other                    cerebrovascular symptoms. Risk Factors:      Hyperlipidemia, past history of smoking. Comparison Study:  NA Performing Technologist: Sharlett Iles RVT  Examination Guidelines: A complete evaluation includes B-mode imaging, spectral Doppler, color Doppler, and power Doppler as needed of all accessible portions of each vessel. Bilateral testing is considered an integral part of a complete examination. Limited examinations for reoccurring indications may be performed as noted.  Right Carotid Findings: +----------+--------+--------+--------+------------------+--------+           PSV cm/sEDV cm/sStenosisPlaque DescriptionComments +----------+--------+--------+--------+------------------+--------+ CCA Prox  53      6                                 tortuous +----------+--------+--------+--------+------------------+--------+ CCA Distal57      13                                         +----------+--------+--------+--------+------------------+--------+ ICA Prox  43      10      1-39%   heterogenous               +----------+--------+--------+--------+------------------+--------+ ICA Mid   41      17              heterogenous                +----------+--------+--------+--------+------------------+--------+ ICA Distal35      17                                         +----------+--------+--------+--------+------------------+--------+ ECA       77      10                                         +----------+--------+--------+--------+------------------+--------+ +----------+--------+-------+---------+-------------------+           PSV cm/sEDV cmsDescribe Arm Pressure (mmHG) +----------+--------+-------+---------+-------------------+ [id number]            Turbulent117                 +----------+--------+-------+---------+-------------------+ +---------+--------+--+--------+-+---------------------------+ VertebralPSV cm/s26EDV cm/s6Antegrade and Small caliber +---------+--------+--+--------+-+---------------------------+  Left Carotid Findings: +----------+--------+--------+--------+------------------+--------+           PSV cm/sEDV cm/sStenosisPlaque DescriptionComments +----------+--------+--------+--------+------------------+--------+ CCA Prox  81      14                                         +----------+--------+--------+--------+------------------+--------+ CCA Distal78      15                                         +----------+--------+--------+--------+------------------+--------+  ICA Prox  101     33      1-39%   heterogenous               +----------+--------+--------+--------+------------------+--------+ ICA Mid   49      17                                         +----------+--------+--------+--------+------------------+--------+ ICA Distal52      24                                         +----------+--------+--------+--------+------------------+--------+ ECA       81      10                                         +----------+--------+--------+--------+------------------+--------+  +----------+--------+--------+----------------+-------------------+           PSV cm/sEDV cm/sDescribe        Arm Pressure (mmHG) +----------+--------+--------+----------------+-------------------+ Subclavian160             Multiphasic, [id number]                 +----------+--------+--------+----------------+-------------------+ +---------+--------+--+--------+--+---------+ VertebralPSV cm/s37EDV cm/s13Antegrade +---------+--------+--+--------+--+---------+   Summary: Right Carotid: Velocities in the right ICA are consistent with a 1-39% stenosis. Left Carotid: Velocities in the left ICA are consistent with a 1-39% stenosis. Vertebrals:  Bilateral vertebral arteries demonstrate antegrade flow. Small              caliber right vertebral artery. Subclavians: Right subclavian artery flow was disturbed. Normal flow              hemodynamics were seen in the left subclavian artery. *See table(s) above for measurements and observations.  Electronically signed by Larae Grooms MD on 01/02/2021 at 2:23:23 PM.    Final    CT Angio Abd/Pel w/ and/or w/o  Result Date: 01/04/2021 CLINICAL DATA:  70 year old male with melena and maroon colored stools. Evaluate for active GI bleed. EXAM: CTA ABDOMEN AND PELVIS WITHOUT AND WITH CONTRAST TECHNIQUE: Multidetector CT imaging of the abdomen and pelvis was performed using the standard protocol during bolus administration of intravenous contrast. Multiplanar reconstructed images and MIPs were obtained and reviewed to evaluate the vascular anatomy. CONTRAST:  158mL OMNIPAQUE IOHEXOL 350 MG/ML SOLN COMPARISON:  Prior CT abdomen/pelvis 04/18/2018; CT chest 10/08/2016 FINDINGS: VASCULAR Aorta: Scattered atherosclerotic vascular calcifications throughout the abdominal aorta. No evidence of aneurysm or dissection. Celiac: Patent without evidence of aneurysm, dissection, vasculitis or significant stenosis. SMA: Patent without evidence of aneurysm, dissection, vasculitis  or significant stenosis. Renals: Mild stenosis at the origin of the right renal artery secondary to fibrofatty atherosclerotic plaque. Left renal artery is widely patent. No accessory renal arteries. IMA: Focal moderate to high-grade stenosis at the origin. The remainder of the artery is patent. Inflow: The iliac arteries are highly tortuous. Scattered atherosclerotic calcifications without significant stenosis, occlusion, dissection or aneurysm. Proximal Outflow: Bilateral common femoral and visualized portions of the superficial and profunda femoral arteries are patent without evidence of aneurysm, dissection, vasculitis or significant stenosis. Veins: No focal venous abnormality. Review of the MIP images confirms the above findings. NON-VASCULAR Lower chest: No acute abnormality. Greater than 2 year stability  of small pulmonary nodules (images 17 and 28 of series 10) consistent with benign granuloma. Trace dependent atelectasis. The visualized heart is normal in size. No pericardial effusion. No significant abnormality of the visualized distal thoracic esophagus. Hepatobiliary: No focal liver abnormality is seen. No gallstones, gallbladder wall thickening, or biliary dilatation. Pancreas: Unremarkable. No pancreatic ductal dilatation or surrounding inflammatory changes. Spleen: Normal in size without focal abnormality. Adrenals/Urinary Tract: The adrenal glands are normal. No hydronephrosis, nephrolithiasis or enhancing renal mass. Large simple cysts exophytic from the upper pole of the right kidney in the anterior interpolar left kidney. The ureters and bladder are unremarkable. Stomach/Bowel: Active extravasation of arterial phase contrast into a diverticulum of the ascending colon just proximal to the hepatic flexure (images 80-94) with evidence of blooming and expansion of the contrast blush on the delayed phase images. Findings are consistent with acute diverticular hemorrhage. Stomach and proximal small  bowel are within normal limits. No focal bowel wall thickening or evidence of bowel obstruction. Lymphatic: No suspicious lymphadenopathy. Reproductive: Prostatomegaly. Other: Fat containing umbilical hernia.  No evidence of ascites. Musculoskeletal: No acute fracture or aggressive appearing lytic or blastic osseous lesion. Mild grade 1 anterolisthesis of L4 on L5 without spondylo lysis. Associated lower lumbar degenerative disc disease and facet arthropathy. IMPRESSION: 1. Positive for active diverticular bleed in the ascending colon just proximal to the hepatic flexure. 2. Scattered atherosclerotic vascular calcifications without aneurysm, dissection or occlusion. Aortic Atherosclerosis (ICD10-I70.0). 3. Progressive lower lumbar degenerative disc disease and facet arthropathy now with mild grade 1 anterolisthesis of L4 on L5. 4. Additional ancillary findings as above without significant interval change compared to prior imaging. Electronically Signed   By: Jacqulynn Cadet M.D.   On: 01/04/2021 09:38    Labs:  CBC: Recent Labs    01/05/21 1821 01/06/21 0257 01/06/21 1657 01/07/21 0245 01/07/21 1538 01/12/21 0657 01/12/21 1706 01/12/21 2227 01/13/21 0530  WBC 9.0 9.0 9.1 10.5  --   --   --   --   --   HGB 7.7* 7.7* 8.0* 7.8*   < > 7.3* 8.3* 9.4* 8.8*  HCT 23.2* 23.2* 24.2* 24.0*   < > 22.4* 24.9* 27.7* 26.3*  PLT 145* 164 170 164  --   --   --   --   --    < > = values in this interval not displayed.    COAGS: No results for input(s): INR, APTT in the last 8760 hours.  BMP: Recent Labs    09/10/20 1058 01/02/21 1238 01/07/21 0245 01/09/21 0249 01/10/21 0303 01/11/21 0259  NA 137   < > 138 137 138 138  K 4.3   < > 4.5 3.6 3.7 3.6  CL 101   < > 109 108 111 109  CO2 22   < > 25 26 23 25   GLUCOSE 96   < > 102* 89 109* 88  BUN 22   < > 24* 17 15 12   CALCIUM 9.4   < > 8.1* 8.2* 8.2* 8.0*  CREATININE 1.24   < > 0.99 0.97 1.07 1.06  GFRNONAA 59*   < > >60 >60 >60 >60  GFRAA 68   --   --   --   --   --    < > = values in this interval not displayed.    LIVER FUNCTION TESTS: Recent Labs    09/10/20 1058 01/02/21 1238 01/09/21 0249  BILITOT 0.5 0.5 0.7  AST 16 13* 18  ALT 10  8 8  ALKPHOS 74 37* 48  PROT 7.4 5.3* 5.7*  ALBUMIN 4.8 3.1* 3.6     Assessment and Plan: 70 y.o. male inpatient. History of Parkinson disease, BPH, HTN, HLD. Presented to ED at [facility] from GI office on 6.9.22 with hematochezia and  near syncopal episode. CTA from 6.11.22 reads Positive for active diverticular bleed in the ascending colon just proximal to the hepatic flexure. Angiogram performed by IR on  6.11 IR  Angiogram by Dr. Earleen Newport  Status post ultrasound guided access right common femoral artery for SMA angiogram, super selective angiogram the right colic arteries of interest in attempt to isolate the source of recent lower GI hemorrhage. No embolization was performed. Due to ongoing intermittent GI bleeding a NM GI Blood Loss study was performed on 6.19.22. Impression reads Faint contrast accumulation in the right upper quadrant likely at the hepatic flexure of the colon. This is at site of prior diverticular bleed on CTA, and is concerning for persistent or recurrent bleed. No active bleeding was demonstrated on intervening catheter angiography. Team is requesting evaluation for possible arteriogram.  Per Epic the Patient received 2 units of PRBC overnight. (6.19.22 to 6.20.22) Most recent  Hgb 8.8. No overt bleeding noted at this time. Patient seen at bedside. Last recorded blood pressure 169/149. Heart rate 84. Patient sitting up in bed attempting to use his phone. Patient states "I am confused" Asking when he will be going back to his room. When patient redirected hat he is currently in ICU for observation for GI bleed he responds. "That is what they keep telling me. I feel fine".  As patient is currently hemodynamically stable with stable Hgb and no evidence of overt bleeding at this time  no IR intervention is indicated at this time. Should Patient condition change and patient becomes hypotensive or hematochezia returns please contact IR.    Thank you for this interesting consult.  I greatly enjoyed meeting [name] and look forward to participating in their care.  A copy of this report was sent to the requesting provider on this date.  Electronically Signed: Jacqualine Mau, NP 01/13/2021, 9:35 AM   I spent a total of 20 Minutes    in face to face in clinical consultation, greater than 50% of which was counseling/coordinating care for evaluation for possible arteriogram.

## 2021-01-14 ENCOUNTER — Inpatient Hospital Stay (HOSPITAL_COMMUNITY): Payer: Medicare Other

## 2021-01-14 DIAGNOSIS — M7989 Other specified soft tissue disorders: Secondary | ICD-10-CM | POA: Diagnosis not present

## 2021-01-14 DIAGNOSIS — R451 Restlessness and agitation: Secondary | ICD-10-CM

## 2021-01-14 LAB — CBC
HCT: 27.4 % — ABNORMAL LOW (ref 39.0–52.0)
HCT: 25.6 % — ABNORMAL LOW (ref 39.0–52.0)
Hemoglobin: 8.9 g/dL — ABNORMAL LOW (ref 13.0–17.0)
Hemoglobin: 8.4 g/dL — ABNORMAL LOW (ref 13.0–17.0)
MCH: 30.5 pg (ref 26.0–34.0)
MCH: 30.9 pg (ref 26.0–34.0)
MCHC: 32.5 g/dL (ref 30.0–36.0)
MCHC: 32.8 g/dL (ref 30.0–36.0)
MCV: 93.8 fL (ref 80.0–100.0)
MCV: 94.1 fL (ref 80.0–100.0)
Platelets: 202 10*3/uL (ref 150–400)
Platelets: 131 10*3/uL — ABNORMAL LOW (ref 150–400)
RBC: 2.92 MIL/uL — ABNORMAL LOW (ref 4.22–5.81)
RBC: 2.72 MIL/uL — ABNORMAL LOW (ref 4.22–5.81)
RDW: 15.1 % (ref 11.5–15.5)
RDW: 14.8 % (ref 11.5–15.5)
WBC: 13.4 10*3/uL — ABNORMAL HIGH (ref 4.0–10.5)
WBC: 8.1 10*3/uL (ref 4.0–10.5)
nRBC: 0 % (ref 0.0–0.2)
nRBC: 0 % (ref 0.0–0.2)

## 2021-01-14 LAB — BASIC METABOLIC PANEL
Anion gap: 7 (ref 5–15)
BUN: 23 mg/dL (ref 8–23)
CO2: 23 mmol/L (ref 22–32)
Calcium: 8.6 mg/dL — ABNORMAL LOW (ref 8.9–10.3)
Chloride: 107 mmol/L (ref 98–111)
Creatinine, Ser: 1.11 mg/dL (ref 0.61–1.24)
GFR, Estimated: >60 mL/min (ref >60)
Glucose, Bld: 87 mg/dL (ref 70–99)
Potassium: 3.9 mmol/L (ref 3.5–5.1)
Sodium: 137 mmol/L (ref 135–145)

## 2021-01-14 MED ORDER — QUETIAPINE FUMARATE 50 MG PO TABS
25.0000 mg | ORAL_TABLET | Freq: Three times a day (TID) | ORAL | Status: DC
  Administered 2021-01-14 – 2021-01-15 (×4): 25 mg via ORAL
  Filled 2021-01-14 (×4): qty 1

## 2021-01-14 MED ORDER — SODIUM CHLORIDE 0.9% FLUSH
10.0000 mL | Freq: Two times a day (BID) | INTRAVENOUS | Status: DC
  Administered 2021-01-14 – 2021-01-16 (×4): 10 mL via INTRAVENOUS

## 2021-01-14 MED ORDER — DEXMEDETOMIDINE HCL IN NACL 200 MCG/50ML IV SOLN
0.4000 ug/kg/h | INTRAVENOUS | Status: DC
  Administered 2021-01-14: 0.3 ug/kg/h via INTRAVENOUS
  Administered 2021-01-14: 0.4 ug/kg/h via INTRAVENOUS
  Filled 2021-01-14 (×2): qty 50
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
## 2021-01-14 NOTE — Progress Notes (Signed)
PCCM Brief note  Chart reviewed and patient examined  He is having waxing / waning delirium, agitation ad aggressive behavior. Likely due to critical care illness and delirium superimposed on his known Parkinson's.  Currently on precedex 0.3 with goal to off. Hopefully he will tolerate with redirection and his ordered parkinson's regimen, seroquel qhs. Could consider making seroquel bid at least temporarily. Would avoid haldol, benzos if able.   If he continues to require precedex today then PCCM will consult formally.    Baltazar Apo, MD, PhD 01/14/2021, 9:27 AM [facility] [phone] or if no answer before 7:00PM call [phone] For any issues after 7:00PM please call eLink [phone]

## 2021-01-14 NOTE — Progress Notes (Signed)
Behavioral/delirium issues noted.  Patient currently resting, asleep.  Did not disturb.  Meanwhile, hemoglobin stable.  Per conversation with nurse, he has not had any visible bleeding today or last night.  It thus appears that this patient is now about 48 to 60 hours out from the time of his most recent bleed.  No new suggestions from GI tract standpoint.  Cleotis Nipper, M.D. Pager [phone] If no answer or after 5 PM call [phone]

## 2021-01-14 NOTE — Consult Note (Addendum)
[name] is a 70 y.o. male with medical history significant of Parkinson disease, BPH, HTN, HLD, GI bleed presented with melena.   Patient was constipated for last 2 days, and this morning all of a sudden he passed large amount black tarry stool, then 1 hour later he started to pass maroon-colored stool and started to feel lightheaded.  Patient has had worsening of constipation and congestion and has been taking meloxicam for the last 2 weeks.  He denies any chest pains, fall or loss of consciousness.  Psych consult placed for persistent agitation, change in mental status.  Admitted for GI bleed.  We suspect this is hospital-acquired delirium, but wife is a psychiatric nurse and requested a psych consult.     Patient is seen and assessed by this nurse practitioner, his wife Orland Visconti is present at the time of the evaluation.  Patient does appear to be alert and oriented x2, with some improvement in his mental status.  Throughout the evaluation he continued to wax and wane between intermittent periods of confusion, and is able to have linear conversation.  He is also able to read words on the board, and communicate very effectively with staff while expressing his concerns.  At times he does appear to be verbally agitated, making comments that are more than appropriate in regards to nursing staff.  He expresses concerns that he dislikes staff, as it relates to improper care he has received.  Patient is currently in four-point restraints, and is requesting assistance with a bedpan.  He does have some noticeable swelling in his upper distal extremities which is likely due to restraints.  Discussed with patients the importance of following directions, and his best attempts to remain calm in order to have restraints removed.  Nurse did discuss with patient having a trial period without restraints, however during the 30-minute duration patient remained in four-point restraints.  Lengthy discussion  with patient's wife also took place at the bedside, where we discussed possible post ICU delirium in the setting of Parkinson's disease and mild cognitive impairment.  She also expresses concern regarding his elevated white blood cell count, and which we discussed possible reactive leukocytosis, and or post transfusion acute reaction, which could result in elevated white blood cell count and generalized swelling.  These concerns were expressed to hospitalist Dr. Felizardo Hoffmann, who presented to the room during the evaluation.  Further discussion with his wife regarding caregiver role strain and her trying her best to ensure she is his wife and not an employee at this time.  She has also not had appropriate or adequate sleep, over the past 14 days.  She does have an extensive history and psychiatry, and understands medication side effects, delirium, use of restraints, plan and course of expectation.  She is encouraged to remain at the bedside to help with management of his delirium, while being a supporter and not his caregiver.  We further discussed use of multiple antipsychotic medications particularly IV Haldol which can increase akathisia which results in restlessness, agitation, and or aggression.  Patient does not appear to be responding effectively to current Haldol 2 mg(received 3 doses yesterday June 20).,  In which she was initiated on Precedex drip.  We also discussed Seroquel being a overall better option to target his parkinsonian disease, ICU delirium, agitation, and aggression as patient appears to be nave to most antipsychotics at this time.  Discussed with both wife and patient the importance of resting, allowing brain  to heal,  which will in  return improve his cognitive impairment.  At the time of the evaluation his Precedex was discontinued at 9:50 AM moments prior to beginning psych evaluation.   -Patient likely has ongoing delirium which presents as fluctuating cognition.  .  The patient's exam  is notable for altered sensorium, perceptual disturbances, disorientation and cognitive deficits that appear markedly different than their baseline, suggesting a diagnosis of delirium.  Virtually any medical condition or physiologic stress can precipitate delirium in a susceptible individual, with risk increasing in those with: advanced age, sensory impairments, organic brain disease (stroke, dementia, Parkinsons), psychiatric illness, major chronic medical issues, prolonged hospitalizations, postoperative status, anemia, insomnia/disturbed sleep, and severe pain. Addressing the underlying medical condition and institution of preventative measures are recommended.  - Continue to monitor and treat underlying medical causes of delirium, including infection, electrolyte disturbances, etc. - Delirium precautions - Minimize/avoid deliriogenic meds including: anticholinergic, opiates, benzodiazepines           - Maintain hydration, oxygenation, nutrition           - Limit use of restraints and catheters           - Normalize sleep patterns by minimizing nighttime noise, light and interruptions by     -Ensure sleep apnea treatment is provided overnight.             clustering care, opening blinds during the day           - Reorient the patient frequently, provide easily visible clock and calendar           - Provide sensory aids like glasses, hearing aids           - Encourage ambulation, regular activities and visitors to maintain cognitive stimulation   -Patient would benefit from having family members at bedside to reinforce his orientation.  -We will increase Seroquel 25 mg p.o. 3 times daily to help manage symptoms above. -Recommend limiting use of Haldol 2 mg IV unless necessary for severe agitation and aggression. -Recommend trialing patient off of restraints, assist with increasing immobility.  Patient currently does not meet inpatient psychiatric criteria. -Psychiatry will continue to follow at  this time, until patient's cognitive impairment improves as response to medication management and time, or patient is discharged home.

## 2021-01-14 NOTE — Progress Notes (Signed)
OT Cancellation Note  Patient Details Name: [name] MRN: [id number] DOB: 1950-08-08   Cancelled Treatment:    Reason Eval/Treat Not Completed: Medical issues which prohibited therapy. Patient continues to have altered mental status - does not follow commands and is pulling at restraints. Patient cannot be reasoned with and is too unsafe to attempt out of bed/functional activities at this time.  Emmarie Sannes L Deontay Ladnier 01/14/2021, 9:58 AM
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
## 2021-01-14 NOTE — Progress Notes (Signed)
PROGRESS NOTE    [name]  VPX:[id number] DOB: [birth year] DOA: 01/02/2021 PCP: Denita Lung, MD   Chief Complain: Black stools  Brief Narrative: Patient is a 70 year old African-American male with past medical history of Parkinson's disease, hypertension, hyperlipidemia, previous GI bleed who presented to the emergency department with complaints of positive black stools.  Patient was found to be anemic in presentation.  GI was consulted after the hospitalization.  He underwent EGD without finding of any source of bleeding.  Patient underwent CTA abdomen/pelvis that localized bleeding on the colon area.  IR was consulted but ablation was not successful.  Patient underwent colonoscopy without finding of any acute bleeding, diverticulosis.  Patient became hypotensive in the evening of 01/09/2021 and had to be transferred to stepdown again.  Blood pressure was stable but he developed acute confusion, most likely hospital delirium.  Neurology /psychiatry consulted as per request of the family.  Needed to be started on Precedex for severe agitation.  Assessment & Plan:   Active Problems:   Acute GI bleeding   GI bleed  Acute lower GI bleeding/diverticulosis: Presented with black/maroon-colored stools.  CT abdomen/pelvis was unremarkable.  CTA localized bleeding on the ascending colon but embolization was not successful as there was no significant bleeding during the procedure.  GI consulted and following.  EGD did not show any source of bleeding.  Underwent colonoscopy with finding of pandiverticulosis, no active bleeding, nonbleeding internal hemorrhoids.  Diverticular bleeding is the most likely cause.  Hemoglobin again dropped to the range of 7 on 01/12/21, he didnt have frank rectal bleeding but some maroon-colored stool observed.   We ordered nuclear tagged scan, IR was following.  Nuclear tagged scan showed aggregation of contrast in the hepatic flexure suggesting  recurrent bleed but no  active bleeding was observed on intervening catheter angiography.  We will continue to monitor CBC. Hemoglobin stable in the range of 8.  Likely the bleeding has stopped.  Acute blood loss anemia: Suspected to be from diverticular bleed.  Transfused with 4 units of packed RBC during this hospitalization.  Continue to monitor CBC.  Hemoglobin stable in the range of 8 today.  Parkinson disease/acute  encephalopathy secondary to hospital-acquired delirium: On Sinemet at home.  He had multiple episodes of confusion/agitation.  Most likely this is hospital-acquired delirium or secondary to anesthesia effect during the colonoscopy.  He has history of confusion/delirium secondary to anesthesia medications in the past.  As per the request of the wife, we  requested neurology/psychiatry consultation.  We will continue to monitor his mental status.  We will keep the room bright with day sunlight, we will attempt frequent reorientation.  Will minimize sedatives, narcotics. Started on low-dose Seroquel.    Patient mentation had improved and he became  alert and oriented but became confused again since 01/13/2021, needing to be started on Precedex drip last night.,ow being tapered  hypotension/history of hypertension:  Patient was found to be orthostatic earlier during this hospitalization.  Became hypotensive on 01/09/2021, had to be started on fluids.  Blood pressure again high and home meds have been resumed at reduced dose.  Currently blood pressure stable  Elevated troponin/chest discomfort: Likely from supply demand ischemia from severe anemia.  Echocardiogram showed a preserved left ventricular fraction with grade 2 diastolic dysfunction.  No regional wall motion abnormality.  Continue statin.   No plan for further work-up.   BPH: On alfuzosin at home,stopped due to orthostatic hypotension  Debility/deconditioning: Patient being followed by PT/OT  Right upper extremity swelling: Most likely associated   with IV infiltration.  We will check venous Doppler to rule out DVT           DVT prophylaxis:SCD Code Status: Full Family Communication: During discussion with wife Status is: Inpatient  Remains inpatient appropriate because:Hemodynamically unstable  Dispo: The patient is from: Home              Anticipated d/c is to: Home              Patient currently is not medically stable to d/c.   Difficult to place patient No     Consultants: GI  Procedures:EGD/colonoscopy  Antimicrobials:  Anti-infectives (From admission, onward)    None       Subjective:  Patient seen and examined at the bedside this morning.  Blood pressure was stable during my evaluation.  He was very agitated last night and had to be started on Precedex drip.  When I evaluated him, he was not agitated but significantly confused, he was on soft restraints.  Wife at the bedside.  No report of any bloody bowel movement so far.  Objective: Vitals:   01/13/21 2100 01/14/21 0000 01/14/21 0300 01/14/21 0400  BP:    (!) 152/106  Pulse:    85  Resp: 17 18 (!) 23 17  Temp:  97.9 F (36.6 C)    TempSrc:  Oral    SpO2:    99%  Weight:      Height:        Intake/Output Summary (Last 24 hours) at 01/14/2021 0730 Last data filed at 01/14/2021 0700 Gross per 24 hour  Intake 120 ml  Output 2425 ml  Net -2305 ml   Filed Weights   01/02/21 1213 01/02/21 2017 01/09/21 0714  Weight: 72.6 kg 69.6 kg 69.6 kg    Examination:  General exam: On restraints, significantly deconditioned, confused Respiratory system:  no wheezes or crackles  Cardiovascular system: S1 & S2 heard, RRR.  Gastrointestinal system: Abdomen is nondistended, soft and nontender. Central nervous system: Not alert or oriented  extremities: No edema, no clubbing ,no cyanosis Skin: No rashes, no ulcers,no icterus     Data Reviewed: I have personally reviewed following labs and imaging studies  CBC: Recent Labs  Lab 01/12/21 2227  01/13/21 0530 01/13/21 0950 01/13/21 2031 01/14/21 0250  WBC  --   --  11.5* 12.5* 13.4*  HGB 9.4* 8.8* 9.6* 9.1* 8.9*  HCT 27.7* 26.3* 28.3* 27.2* 27.4*  MCV  --   --  90.7 91.9 93.8  PLT  --   --  195 200 161   Basic Metabolic Panel: Recent Labs  Lab 01/09/21 0249 01/10/21 0303 01/11/21 0259 01/14/21 0250  NA 137 138 138 137  K 3.6 3.7 3.6 3.9  CL 108 111 109 107  CO2 26 23 25 23   GLUCOSE 89 109* 88 87  BUN 17 15 12 23   CREATININE 0.97 1.07 1.06 1.11  CALCIUM 8.2* 8.2* 8.0* 8.6*  MG 2.1  --   --   --   PHOS 2.7  --   --   --    GFR: Estimated Creatinine Clearance: 60.1 mL/min (by C-G formula based on SCr of 1.11 mg/dL). Liver Function Tests: Recent Labs  Lab 01/09/21 0249  AST 18  ALT 8  ALKPHOS 48  BILITOT 0.7  PROT 5.7*  ALBUMIN 3.6   No results for input(s): LIPASE, AMYLASE in the last 168 hours. Recent Labs  Lab 01/10/21 1131  AMMONIA 20   Coagulation Profile: No results for input(s): INR, PROTIME in the last 168 hours. Cardiac Enzymes: No results for input(s): CKTOTAL, CKMB, CKMBINDEX, TROPONINI in the last 168 hours. BNP (last 3 results) No results for input(s): PROBNP in the last 8760 hours. HbA1C: No results for input(s): HGBA1C in the last 72 hours. CBG: No results for input(s): GLUCAP in the last 168 hours. Lipid Profile: No results for input(s): CHOL, HDL, LDLCALC, TRIG, CHOLHDL, LDLDIRECT in the last 72 hours.  Thyroid Function Tests: Recent Labs    01/11/21 1655  TSH 3.131   Anemia Panel: Recent Labs    01/11/21 1655  VITAMINB12 1,268*   Sepsis Labs: No results for input(s): PROCALCITON, LATICACIDVEN in the last 168 hours.  Recent Results (from the past 240 hour(s))  MRSA PCR Screening     Status: None   Collection Time: 01/04/21 11:52 AM   Specimen: Nasal Mucosa; Nasopharyngeal  Result Value Ref Range Status   MRSA by PCR NEGATIVE NEGATIVE Final    Comment:        The GeneXpert MRSA Assay (FDA approved for NASAL  specimens only), is one component of a comprehensive MRSA colonization surveillance program. It is not intended to diagnose MRSA infection nor to guide or monitor treatment for MRSA infections. Performed at [facility], [place] [address] [place], [place] [address]          Radiology Studies: NM GI Blood Loss  Result Date: 01/12/2021 CLINICAL DATA:  GI bleed.  Intermittent GI bleeding. EXAM: NUCLEAR MEDICINE GASTROINTESTINAL BLEEDING SCAN TECHNIQUE: Sequential abdominal images were obtained following intravenous administration of Tc-19m labeled red blood cells. RADIOPHARMACEUTICALS:  20.6 mCi Tc-30m pertechnetate in-vitro labeled red cells. COMPARISON:  Abdominal CTA 01/04/2021, as well as catheter angiography 01/04/2021. FINDINGS: Imaging obtained for 2 hours. There is faint contrast accumulating in the right upper quadrant, initial hour image 5. This faintly increases in intensity but does not travel along the course of the large bowel. This is at site of prior active extravasation/diverticular bleed. IMPRESSION: Faint contrast accumulation in the right upper quadrant likely at the hepatic flexure of the colon. This is at site of prior diverticular bleed on CTA, and is concerning for persistent or recurrent bleed. No active bleeding was demonstrated on intervening catheter angiography. These results will be called to the ordering clinician or representative by the Radiologist Assistant, and communication documented in the PACS or [employer]. Electronically Signed   By: Keith Rake M.D.   On: 01/12/2021 16:52        Scheduled Meds:  sodium chloride   Intravenous Once   sodium chloride   Intravenous Once   sodium chloride   Intravenous Once   sodium chloride   Intravenous Once   atorvastatin  40 mg Oral QPM   carbidopa-levodopa  1.5 tablet Oral TID   Chlorhexidine Gluconate Cloth  6 each Topical Daily   citalopram  20 mg Oral Daily   irbesartan  75 mg  Oral q morning   mouth rinse  15 mL Mouth Rinse BID   multivitamin with minerals  1 tablet Oral Daily   pantoprazole  40 mg Oral Daily   psyllium  1 packet Oral Daily   QUEtiapine  25 mg Oral QHS   timolol  1 drop Both Eyes Daily   trihexyphenidyl  1 mg Oral BID WC   verapamil  120 mg Oral Daily   Continuous Infusions:  dexmedetomidine (PRECEDEX) IV infusion 0.4  mcg/kg/hr (01/14/21 0156)      LOS: 10 days    Time spent: 35 mins.More than 50% of that time was spent in counseling and/or coordination of care.      Shelly Coss, MD [facility] P6/21/2022, 7:30 AM

## 2021-01-14 NOTE — Progress Notes (Signed)
PT Cancellation Note  Patient Details Name: OSAMU OLGUIN MRN: [id number] DOB: 19-Jan-1951   Cancelled Treatment:    Reason Eval/Treat Not Completed: Medical issues which prohibited therapy, patient attempting to get  OOB, nursing in room. Unable to redirect patient.  Precedex recently turned off. Will follow as patient able to safely participate.    Claretha Cooper 01/14/2021, 9:55 AM [place] Pager [phone] Office [phone]

## 2021-01-14 NOTE — Plan of Care (Signed)
  Problem: Elimination: Goal: Will not experience complications related to urinary retention Outcome: Progressing   Problem: Bowel/Gastric: Goal: Will show no signs and symptoms of gastrointestinal bleeding Outcome: Progressing   Problem: Safety: Goal: Non-violent Restraint(s) Outcome: Progressing

## 2021-01-14 NOTE — Progress Notes (Signed)
    BRIEF OVERNIGHT PROGRESS REPORT  Patient is calm at the time of this note. He will still awake and is confused but is not combative or aggressive at this time. He does still attempt removal of devices periodically. He currently has all monitoring on and active. RN is titrating to lowest possible dose.   Gershon Cull MSNA ACNPC-AG Acute Care Nurse Practitioner [place]

## 2021-01-14 NOTE — Progress Notes (Signed)
Right upper extremity venous duplex has been completed. Preliminary results can be found in CV Proc through chart review.  Results were given to the patient's nurse, Gila.  01/14/21 1:33 PM Carlos Levering RVT

## 2021-01-15 LAB — CBC
HCT: 29.5 % — ABNORMAL LOW (ref 39.0–52.0)
Hemoglobin: 9.5 g/dL — ABNORMAL LOW (ref 13.0–17.0)
MCH: 30.4 pg (ref 26.0–34.0)
MCHC: 32.2 g/dL (ref 30.0–36.0)
MCV: 94.2 fL (ref 80.0–100.0)
Platelets: 221 10*3/uL (ref 150–400)
RBC: 3.13 MIL/uL — ABNORMAL LOW (ref 4.22–5.81)
RDW: 14.6 % (ref 11.5–15.5)
WBC: 8.8 10*3/uL (ref 4.0–10.5)
nRBC: 0 % (ref 0.0–0.2)

## 2021-01-15 LAB — BASIC METABOLIC PANEL
Anion gap: 5 (ref 5–15)
BUN: 28 mg/dL — ABNORMAL HIGH (ref 8–23)
CO2: 24 mmol/L (ref 22–32)
Calcium: 8.2 mg/dL — ABNORMAL LOW (ref 8.9–10.3)
Chloride: 108 mmol/L (ref 98–111)
Creatinine, Ser: 1.04 mg/dL (ref 0.61–1.24)
GFR, Estimated: >60 mL/min (ref >60)
Glucose, Bld: 91 mg/dL (ref 70–99)
Potassium: 3.5 mmol/L (ref 3.5–5.1)
Sodium: 137 mmol/L (ref 135–145)

## 2021-01-15 MED ORDER — QUETIAPINE FUMARATE 50 MG PO TABS: 25.0000 mg | ORAL_TABLET | Freq: Three times a day (TID) | ORAL | Status: DC

## 2021-01-15 MED ORDER — QUETIAPINE FUMARATE 25 MG PO TABS
25.0000 mg | ORAL_TABLET | Freq: Three times a day (TID) | ORAL | Status: DC
  Administered 2021-01-15 – 2021-01-16 (×3): 25 mg via ORAL
  Filled 2021-01-15 (×3): qty 1

## 2021-01-15 MED ORDER — POTASSIUM CHLORIDE CRYS ER 20 MEQ PO TBCR
40.0000 meq | EXTENDED_RELEASE_TABLET | Freq: Once | ORAL | Status: AC
  Administered 2021-01-15: 40 meq via ORAL
  Filled 2021-01-15: qty 2

## 2021-01-15 NOTE — Progress Notes (Signed)
PROGRESS NOTE    [name]  SEG:[id number] DOB: 02-Jun-1951 DOA: 01/02/2021 PCP: Denita Lung, MD    Brief Narrative:  This 70 year old Male with PMH  significant of Parkinson's disease, HTN,  hyperlipidemia, previous GI bleed who presented to the emergency department with complaints of black stools.  Patient was found to be anemic in presentation.  GI was consulted after the hospitalization.  He underwent EGD without finding of any source of bleeding.  Patient underwent CTA abdomen/pelvis that localized bleeding on the colon area.  IR was consulted but ablation was not successful.  Patient underwent colonoscopy without finding of any acute bleeding, diverticulosis.  Patient became hypotensive in the evening of 01/09/2021 and had to be transferred to stepdown again.  Blood pressure was stable but he developed acute confusion, most likely hospital delirium.  Neurology /psychiatry consulted as per request of the family.  Needed to be started on Precedex for severe agitation.  Mental status has improved.  Hemoglobin remained stable in the range of 8.0  Assessment & Plan:   Active Problems:   Acute GI bleeding   GI bleed   Acute lower GI bleeding/diverticulosis:  He presented with black/maroon-colored stools.  CT abdomen/pelvis was unremarkable.   CTA localized bleeding on the ascending colon but embolization was not successful as there was no significant bleeding during the procedure.   GI consulted and following.  EGD did not show any source of bleeding.  He underwent colonoscopy with finding of pandiverticulosis, but no active bleeding, nonbleeding internal hemorrhoids.  Diverticular bleeding is the most likely cause. Hemoglobin again dropped to the range of 7 on 01/12/21, he didnt have frank rectal bleeding but some maroon-colored stool observed.    Nuclear tagged scan showed aggregation of contrast in the hepatic flexure suggesting  recurrent bleed but no active bleeding was observed on  intervening catheter angiography.  We will continue to monitor CBC. Hemoglobin  remains stable above 8.  Likely the bleeding has stopped.   Acute blood loss anemia:  Suspected to be from diverticular bleed.  Transfused with 4 units of packed RBC during this hospitalization.   Continue to monitor CBC.  Hemoglobin stable in the range of 9.5 today.   Acute encephalopathy secondary to hospital-acquired delirium:  He had multiple episodes of confusion/agitation.  Most likely this is hospital-acquired delirium or secondary to anesthesia effect during the colonoscopy.  He has history of confusion/delirium secondary to anesthesia medications in the past.  As per the request of the wife, we  requested neurology/psychiatry consultation.  We will continue to monitor his mental status.  We will keep the room bright with day sunlight, we will attempt frequent reorientation.  Will minimize sedatives, narcotics. Started on low-dose Seroquel.    Patient mentation had improved and he became alert and oriented but became confused again since 01/13/2021, needing to be started on Precedex drip last night.,now being tapered.  Psych consulted recommended to continue Seroquel and discontinue Haldol.  Patient does not need inpatient psych hospitalization.   Parkinsons disease:  continue Sinemet.  Essential hypertension:   Currently blood pressure stable.   Elevated troponin/chest discomfort:  Likely from supply demand ischemia from severe anemia.   Echocardiogram showed a preserved left ventricular fraction with grade 2 diastolic dysfunction.  No regional wall motion abnormality.   Continue statin.   No plan for further work-up.     BPH: On alfuzosin at home,stopped due to orthostatic hypotension.   Debility/deconditioning: Patient being followed by PT/OT  Right upper extremity swelling:  Most likely associated  with IV infiltration.   Venous duplex completed but report is pending.     DVT prophylaxis:  SCDs Code Status:  Full code. Family Communication: No family at bed side. Disposition Plan:   Status is: Inpatient  Remains inpatient appropriate because:Inpatient level of care appropriate due to severity of illness  Dispo: The patient is from: Home              Anticipated d/c is to: Home              Patient currently is not medically stable to d/c.   Difficult to place patient No  Consultants:  GI  Procedures:  EGD Antimicrobials:   Anti-infectives (From admission, onward)    None       Subjective: Patient was seen and examined at bedside.  Overnight events noted.   Patient reports feeling improved,  denies any further bleeding.  He does not appear agitated, his mental status back to normal.  Objective: Vitals:   01/15/21 1024 01/15/21 1100 01/15/21 1200 01/15/21 1207  BP: (!) 177/96 (!) 180/101 (!) 194/179 98/60  Pulse:  (!) 57 66 65  Resp:  20 13 17   Temp:   98.3 F (36.8 C)   TempSrc:   Oral   SpO2:  100% 100% 100%  Weight:      Height:        Intake/Output Summary (Last 24 hours) at 01/15/2021 1355 Last data filed at 01/15/2021 1221 Gross per 24 hour  Intake 740 ml  Output 3125 ml  Net -2385 ml   Filed Weights   01/02/21 1213 01/02/21 2017 01/09/21 0714  Weight: 72.6 kg 69.6 kg 69.6 kg    Examination:  General exam: Appears calm and comfortable, not in any acute distress. Respiratory system: Clear to auscultation. Respiratory effort normal. Cardiovascular system: S1 & S2 heard, RRR. No JVD, murmurs, rubs, gallops or clicks. No pedal edema. Gastrointestinal system: Abdomen is nondistended, soft and nontender. No organomegaly or masses felt. Normal bowel sounds heard. Central nervous system: Alert and oriented. No focal neurological deficits. Extremities: Symmetric 5 x 5 power.  No edema, no cyanosis, no clubbing. Skin: No rashes, lesions or ulcers Psychiatry: Judgement and insight appear normal. Mood & affect appropriate.     Data Reviewed: I  have personally reviewed following labs and imaging studies  CBC: Recent Labs  Lab 01/13/21 0950 01/13/21 2031 01/14/21 0250 01/14/21 1838 01/15/21 1036  WBC 11.5* 12.5* 13.4* 8.1 8.8  HGB 9.6* 9.1* 8.9* 8.4* 9.5*  HCT 28.3* 27.2* 27.4* 25.6* 29.5*  MCV 90.7 91.9 93.8 94.1 94.2  PLT 195 200 202 131* 161   Basic Metabolic Panel: Recent Labs  Lab 01/09/21 0249 01/10/21 0303 01/11/21 0259 01/14/21 0250 01/15/21 0250  NA 137 138 138 137 137  K 3.6 3.7 3.6 3.9 3.5  CL 108 111 109 107 108  CO2 26 23 25 23 24   GLUCOSE 89 109* 88 87 91  BUN 17 15 12 23  28*  CREATININE 0.97 1.07 1.06 1.11 1.04  CALCIUM 8.2* 8.2* 8.0* 8.6* 8.2*  MG 2.1  --   --   --   --   PHOS 2.7  --   --   --   --    GFR: Estimated Creatinine Clearance: 64.1 mL/min (by C-G formula based on SCr of 1.04 mg/dL). Liver Function Tests: Recent Labs  Lab 01/09/21 0249  AST 18  ALT 8  ALKPHOS 48  BILITOT 0.7  PROT 5.7*  ALBUMIN 3.6   No results for input(s): LIPASE, AMYLASE in the last 168 hours. Recent Labs  Lab 01/10/21 1131  AMMONIA 20   Coagulation Profile: No results for input(s): INR, PROTIME in the last 168 hours. Cardiac Enzymes: No results for input(s): CKTOTAL, CKMB, CKMBINDEX, TROPONINI in the last 168 hours. BNP (last 3 results) No results for input(s): PROBNP in the last 8760 hours. HbA1C: No results for input(s): HGBA1C in the last 72 hours. CBG: No results for input(s): GLUCAP in the last 168 hours. Lipid Profile: No results for input(s): CHOL, HDL, LDLCALC, TRIG, CHOLHDL, LDLDIRECT in the last 72 hours. Thyroid Function Tests: No results for input(s): TSH, T4TOTAL, FREET4, T3FREE, THYROIDAB in the last 72 hours. Anemia Panel: No results for input(s): VITAMINB12, FOLATE, FERRITIN, TIBC, IRON, RETICCTPCT in the last 72 hours. Sepsis Labs: No results for input(s): PROCALCITON, LATICACIDVEN in the last 168 hours.  No results found for this or any previous visit (from the past 240  hour(s)).    Radiology Studies: VAS [place] UPPER EXTREMITY VENOUS DUPLEX  Result Date: 01/14/2021 UPPER VENOUS STUDY  Patient Name:  [name]  Date of Exam:   01/14/2021 Medical Rec #: [id number]        Accession #:    [id number] Date of Birth: 1950-11-17        Patient Gender: M Patient Age:   70Y Exam Location:  [facility] Procedure:      VAS [place] UPPER EXTREMITY VENOUS DUPLEX Referring Phys: [id number] AMRIT ADHIKARI --------------------------------------------------------------------------------  Indications: Swelling Risk Factors: None identified. Comparison Study: No prior studies. Performing Technologist: Oliver Hum RVT  Examination Guidelines: A complete evaluation includes B-mode imaging, spectral Doppler, color Doppler, and power Doppler as needed of all accessible portions of each vessel. Bilateral testing is considered an integral part of a complete examination. Limited examinations for reoccurring indications may be performed as noted.  Right Findings: +----------+------------+---------+-----------+----------+-------+ RIGHT     CompressiblePhasicitySpontaneousPropertiesSummary +----------+------------+---------+-----------+----------+-------+ IJV           Full       Yes       Yes                      +----------+------------+---------+-----------+----------+-------+ Subclavian    Full       Yes       Yes                      +----------+------------+---------+-----------+----------+-------+ Axillary      Full       Yes       Yes                      +----------+------------+---------+-----------+----------+-------+ Brachial      Full       Yes       Yes                      +----------+------------+---------+-----------+----------+-------+ Radial        Full                                          +----------+------------+---------+-----------+----------+-------+ Ulnar         Full                                           +----------+------------+---------+-----------+----------+-------+  Cephalic      None                                  Chronic +----------+------------+---------+-----------+----------+-------+ Basilic       Full                                          +----------+------------+---------+-----------+----------+-------+ Cephalic thrombus noted within the mid to proximal forearm.  Left Findings: +----------+------------+---------+-----------+----------+-------+ LEFT      CompressiblePhasicitySpontaneousPropertiesSummary +----------+------------+---------+-----------+----------+-------+ Subclavian    Full       Yes       Yes                      +----------+------------+---------+-----------+----------+-------+  Summary:  Right: No evidence of deep vein thrombosis in the upper extremity. 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  Left: No evidence of thrombosis in the subclavian.  *See table(s) above for measurements and observations.    Preliminary     Scheduled Meds:  sodium chloride   Intravenous Once   atorvastatin  40 mg Oral QPM   carbidopa-levodopa  1.5 tablet Oral TID   Chlorhexidine Gluconate Cloth  6 each Topical Daily   citalopram  20 mg Oral Daily   irbesartan  75 mg Oral q morning   mouth rinse  15 mL Mouth Rinse BID   multivitamin with minerals  1 tablet Oral Daily   pantoprazole  40 mg Oral Daily   psyllium  1 packet Oral Daily   QUEtiapine  25 mg Oral TID   sodium chloride flush  10 mL Intravenous Q12H   timolol  1 drop Both Eyes Daily   trihexyphenidyl  1 mg Oral BID WC   verapamil  120 mg Oral Daily   Continuous Infusions:   LOS: 11 days    Time spent: 35 mins    Anamari Galeas, MD [facility]   If 7PM-7AM, please contact night-coverage

## 2021-01-15 NOTE — Progress Notes (Signed)
No evidence of further bleeding x 72hrs.  1 stool with old blood.  Hgb stable (actually improved).  Delerium seems to be resolved.  Recomm:  OK for dischg from GI standpoint whenever pt is otherwise ready to leave No dietary restrictions Pt has f/u ov in my office with my PA [name]) 01/23/21 at 1:30pm to check hemoccult status, cbc Will sign off; call any time if we can be of further help with this patient.   Cleotis Nipper, M.D. Pager [phone] If no answer or after 5 PM call [phone]

## 2021-01-15 NOTE — Progress Notes (Signed)
Chaplain engaged in an initial visit with [name] and his wife.  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  His wife has worked for [employer] in the behavioral health unit and is a Marine scientist by trade. They met at [facility] and both were majoring in fields of helping others in realms of healthcare.    Mrs. Kipp noted how hard it was to see her husband in a state of incoherence and delirium.  She expressed that today is one of the first days in which he has been back to himself and coherent.  [name] had not realized that he had been in the hospital for almost 14 days now.  He is just now coming to and recognizing all that has been happening.  They both expressed a gratefulness for where he is now.    Mrs. Wogan expressed that so many people have been praying signifying that they are people of faith.  Chaplain affirmed those prayers and the ways [name] has progressed.  Chaplain will pray over [name] before he discharges.  Chaplain offered support.   Chaplain is available to follow-up.    01/15/21 1200  Clinical Encounter Type  Visited With Patient and family together  Visit Type Initial

## 2021-01-15 NOTE — Progress Notes (Signed)
Physical Therapy Treatment Patient Details Name: [name] MRN: [id number] DOB: 01-22-1951 Today's Date: 01/15/2021    History of Present Illness Patient is a 70 year old male presenting to ED with syncopal episode. CTA of abdomen/pelvis showed localized bleeding to the colon. Patient s/p colonoscopy 6/16. PMH includes Parkinson disease, BPH, HTN, HLD and GI bleed    PT Comments    The  patient's mentation significantly improved. Patient  able to participate in trnasfers. Should be able to progress ambulation if remains at this status.    Follow Up Recommendations  Home health PT     Equipment Recommendations  None recommended by PT    Recommendations for Other Services       Precautions / Restrictions Precautions Precautions: Fall Precaution Comments: hx orthostatic hypotension/syncope    Mobility  Bed Mobility Overal bed mobility: Needs Assistance Bed Mobility: Supine to Sit     Supine to sit: Supervision;HOB elevated          Transfers Overall transfer level: Needs assistance Equipment used: 1 person hand held assist Transfers: Sit to/from Stand;Stand Pivot Transfers Sit to Stand: Min guard Stand pivot transfers: Min guard       General transfer comment: min guard for safety as patient is mildly unsteady  Ambulation/Gait                 Stairs             Wheelchair Mobility    Modified Rankin (Stroke Patients Only)       Balance Overall balance assessment: Mild deficits observed, not formally tested                                          Cognition Arousal/Alertness: Awake/alert Behavior During Therapy: [facility] for tasks assessed/performed;Impulsive Overall Cognitive Status: Within Functional Limits for tasks assessed                                 General Comments: patient's spouse stating much better day with mentation for patient than yesterday. did not specifically ask orientation  questions however patient following directions and conversing appropriately      Exercises      General Comments        Pertinent Vitals/Pain Pain Assessment: No/denies pain    Home Living                      Prior Function            PT Goals (current goals can now be found in the care plan section) Acute Rehab PT Goals Patient Stated Goal: resume outpatient PT    Frequency    Min 3X/week      PT Plan Current plan remains appropriate;Discharge plan needs to be updated    Co-evaluation PT/OT/SLP Co-Evaluation/Treatment: Yes Reason for Co-Treatment: To address functional/ADL transfers;For patient/therapist safety PT goals addressed during session: Mobility/safety with mobility OT goals addressed during session: ADL's and self-care      AM-PAC PT "6 Clicks" Mobility   Outcome Measure  Help needed turning from your back to your side while in a flat bed without using bedrails?: A Little Help needed moving from lying on your back to sitting on the side of a flat bed without using bedrails?: A Little Help needed moving to  and from a bed to a chair (including a wheelchair)?: A Little Help needed standing up from a chair using your arms (e.g., wheelchair or bedside chair)?: A Little Help needed to walk in hospital room?: A Little Help needed climbing 3-5 steps with a railing? : A Lot 6 Click Score: 17    End of Session   Activity Tolerance: Patient tolerated treatment well Patient left: in chair;with call bell/phone within reach;with chair alarm set;with family/visitor present Nurse Communication: Mobility status PT Visit Diagnosis: Muscle weakness (generalized) (M62.81);Difficulty in walking, not elsewhere classified (R26.2);Unsteadiness on feet (R26.81)     Time: 1540-0867 PT Time Calculation (min) (ACUTE ONLY): 21 min  Charges:  $Therapeutic Activity: 8-22 mins                     Tresa Endo PT Acute Rehabilitation Services Pager  [phone] Office [phone]    Claretha Cooper 01/15/2021, 2:40 PM

## 2021-01-15 NOTE — Progress Notes (Signed)
Occupational Therapy Treatment Patient Details Name: [name] MRN: [id number] DOB: 09/18/1950 Today's Date: 01/15/2021    History of present illness Patient is a 70 year old male presenting to ED with syncopal episode. CTA of abdomen/pelvis showed localized bleeding to the colon. Patient s/p colonoscopy 6/16. PMH includes Parkinson disease, BPH, HTN, HLD and GI bleed   OT comments  Patient mentation much improved from yesterday, no longer in restraints. Patient following directions and conversing with therapy appropriately. Patient able to sit himself up in bed without physical assistance, min G for safety to pivot to bedside commode. Total A for perianal care after bowel movement as patient holds bilaterally to commode in standing. Patient then min G to recliner chair, denies dizziness and BP remain stable throughout session. Spouse hopeful to take patient home as patient mentation continues to improve.    Follow Up Recommendations  Supervision/Assistance - 24 hour    Equipment Recommendations  None recommended by OT       Precautions / Restrictions Precautions Precautions: Fall Precaution Comments: hx orthostatic hypotension/syncope       Mobility Bed Mobility Overal bed mobility: Needs Assistance Bed Mobility: Supine to Sit     Supine to sit: Supervision;HOB elevated          Transfers Overall transfer level: Needs assistance Equipment used: 1 person hand held assist Transfers: Sit to/from [facility] Sit to Stand: Min guard Stand pivot transfers: Min guard       General transfer comment: min guard for safety as patient is mildly unsteady    Balance Overall balance assessment: Mild deficits observed, not formally tested                                         ADL either performed or assessed with clinical judgement   ADL Overall ADL's : Needs assistance/impaired     Grooming: Wash/dry hands;Set up;Sitting                    Toilet Transfer: Min Statistician Details (indicate cue type and reason): min G for safety Toileting- Clothing Manipulation and Hygiene: Total assistance;Sit to/from stand Toileting - Clothing Manipulation Details (indicate cue type and reason): patient holding onto bedside commode for steadying, total A for perianal care after bowel movement     Functional mobility during ADLs: Min guard General ADL Comments: blood pressure stable post functional transfers      Cognition Arousal/Alertness: Awake/alert Behavior During Therapy: WFL for tasks assessed/performed;Impulsive Overall Cognitive Status: Within Functional Limits for tasks assessed                                 General Comments: patient's spouse stating much better day with mentation for patient than yesterday. did not specifically ask orientation questions however patient following directions and conversing appropriately                   Pertinent Vitals/ Pain       Pain Assessment: No/denies pain         Frequency  Min 2X/week        Progress Toward Goals  OT Goals(current goals can now be found in the care plan section)  Progress towards OT goals: Progressing toward goals  Acute Rehab OT Goals Patient Stated Goal: resume outpatient PT  OT Goal Formulation: With patient/family Time For Goal Achievement: 01/23/21 Potential to Achieve Goals: Good  Plan Discharge plan remains appropriate    Co-evaluation    PT/OT/SLP Co-Evaluation/Treatment: Yes Reason for Co-Treatment: To address functional/ADL transfers;For patient/therapist safety PT goals addressed during session: Mobility/safety with mobility OT goals addressed during session: ADL's and self-care      AM-PAC OT "6 Clicks" Daily Activity     Outcome Measure   Help from another person eating meals?: None Help from another person taking care of personal grooming?: A Little Help from  another person toileting, which includes using toliet, bedpan, or urinal?: A Little Help from another person bathing (including washing, rinsing, drying)?: A Little Help from another person to put on and taking off regular upper body clothing?: A Little Help from another person to put on and taking off regular lower body clothing?: A Little 6 Click Score: 19    End of Session    OT Visit Diagnosis: Unsteadiness on feet (R26.81);History of falling (Z91.81)   Activity Tolerance Patient tolerated treatment well   Patient Left in chair;with call bell/phone within reach;with chair alarm set;with family/visitor present   Nurse Communication Mobility status        Time: 9794-8016 OT Time Calculation (min): 19 min  Charges: OT General Charges $OT Visit: 1 Visit OT Treatments $Self Care/Home Management : 8-22 mins  Delbert Phenix OT OT pager: [phone]   Rosemary Holms 01/15/2021, 2:08 PM

## 2021-01-15 NOTE — Consult Note (Signed)
  [name] is a 70 y.o. male with medical history significant of Parkinson disease, BPH, HTN, HLD, GI bleed presented with melena.   Patient was constipated for last 2 days, and this morning all of a sudden he passed large amount black tarry stool, then 1 hour later he started to pass maroon-colored stool and started to feel lightheaded.  Patient has had worsening of constipation and congestion and has been taking meloxicam for the last 2 weeks.  He denies any chest pains, fall or loss of consciousness.  Psych consult placed for persistent agitation, change in mental status.  Admitted for GI bleed.  We suspect this is hospital-acquired delirium, but wife is a psychiatric nurse and requested a psych consult.     Patient is seen and assessed by this nurse practitioner. Patient appears to be  Patient does appear to be much improved at this time. He is alert and oriented x 3. He appears to be responding appropriately and engaging in linear conversations. He answers all questions appropriately at this time. He appears to be engaging well with providers and staff. He presents less agitated and restless today as he is unrestrained, and interacting well. He allows staff to bath him. He denies any signs or symptoms of psychosis or delirium at this time. He is able to ask appropriate questions such as (I.e discharge, length of current stay).  He appears to be responding well to appropriate medications at this time and will benefit from ongoing use of medications for a temporary length of time. Updated wife Ferman Basilio via phone to review his progress and response to the medications. We discussed ongoing course of expectations and delirium once he returns home. We reviewed what is normal, vs red flags that may warrant call to neurology. We also reviewed clinical trials for Parkinson Diseases and importance of slowing progression of disease and neurocognitive rehab.   - Continue to monitor and treat  underlying medical causes of delirium, including infection, electrolyte disturbances, etc. - Delirium precautions - Minimize/avoid deliriogenic meds including: anticholinergic, opiates, benzodiazepines           - Maintain hydration, oxygenation, nutrition           - Limit use of restraints and catheters           - Normalize sleep patterns by minimizing nighttime noise, light and interruptions by     -Ensure sleep apnea treatment is provided overnight.             clustering care, opening blinds during the day           - Reorient the patient frequently, provide easily visible clock and calendar           - Provide sensory aids like glasses, hearing aids           - Encourage ambulation, regular activities and visitors to maintain cognitive stimulation   -Patient would benefit from having family members at bedside to reinforce his orientation.  -We will continue Seroquel 25 mg p.o. 3 times daily to help manage symptoms above (for length of 3 days). He may discharge home with prn DC Haldol IV at this time.   Patient currently does not meet inpatient psychiatric criteria. Psychiatry to sign off at this time.

## 2021-01-15 NOTE — Progress Notes (Signed)
[facility] ADULT ICU REPLACEMENT PROTOCOL   The patient does apply for the [facility] Adult ICU Electrolyte Replacment Protocol based on the criteria listed below:   1. Is GFR >/= 30 ml/min? Yes.    Patient's GFR today is >60 2. Is SCr </= 2? Yes.   Patient's SCr is 1.04 ml/kg/hr 3. Did SCr increase >/= 0.5 in 24 hours? No. 4. Abnormal electrolyte(s): k+ 3.5 5. Ordered repletion with: standing orders 6. If a panic level lab has been reported, has the CCM MD in charge been notified? No..     [name] 01/15/2021 4:25 AM

## 2021-01-16 ENCOUNTER — Other Ambulatory Visit (HOSPITAL_COMMUNITY): Payer: Self-pay

## 2021-01-16 LAB — CBC
HCT: 33.2 % — ABNORMAL LOW (ref 39.0–52.0)
Hemoglobin: 10.8 g/dL — ABNORMAL LOW (ref 13.0–17.0)
MCH: 30.1 pg (ref 26.0–34.0)
MCHC: 32.5 g/dL (ref 30.0–36.0)
MCV: 92.5 fL (ref 80.0–100.0)
Platelets: 288 10*3/uL (ref 150–400)
RBC: 3.59 MIL/uL — ABNORMAL LOW (ref 4.22–5.81)
RDW: 14.6 % (ref 11.5–15.5)
WBC: 8.5 10*3/uL (ref 4.0–10.5)
nRBC: 0 % (ref 0.0–0.2)

## 2021-01-16 LAB — BASIC METABOLIC PANEL
Anion gap: 6 (ref 5–15)
BUN: 22 mg/dL (ref 8–23)
CO2: 25 mmol/L (ref 22–32)
Calcium: 8.5 mg/dL — ABNORMAL LOW (ref 8.9–10.3)
Chloride: 107 mmol/L (ref 98–111)
Creatinine, Ser: 1.08 mg/dL (ref 0.61–1.24)
GFR, Estimated: >60 mL/min (ref >60)
Glucose, Bld: 96 mg/dL (ref 70–99)
Potassium: 4 mmol/L (ref 3.5–5.1)
Sodium: 138 mmol/L (ref 135–145)

## 2021-01-16 MED ORDER — CITALOPRAM HYDROBROMIDE 20 MG PO TABS
20.0000 mg | ORAL_TABLET | Freq: Every day | ORAL | 1 refills | Status: DC
  Filled 2021-01-16: qty 30, 30d supply, fill #0
  Filled 2021-02-02: qty 30, 30d supply, fill #1

## 2021-01-16 MED ORDER — QUETIAPINE FUMARATE 25 MG PO TABS
25.0000 mg | ORAL_TABLET | Freq: Three times a day (TID) | ORAL | 0 refills | Status: DC
  Filled 2021-01-16: qty 90, 30d supply, fill #0

## 2021-01-16 MED ORDER — VERAPAMIL HCL ER 120 MG PO TBCR
120.0000 mg | EXTENDED_RELEASE_TABLET | Freq: Every day | ORAL | 1 refills | Status: DC
  Filled 2021-01-16: qty 30, 30d supply, fill #0
  Filled 2021-02-02: qty 30, 30d supply, fill #1

## 2021-01-16 MED ORDER — IRBESARTAN 75 MG PO TABS
75.0000 mg | ORAL_TABLET | Freq: Every morning | ORAL | 1 refills | Status: DC
  Filled 2021-01-16: qty 30, 30d supply, fill #0
  Filled 2021-02-02: qty 30, 30d supply, fill #1

## 2021-01-16 NOTE — Discharge Summary (Signed)
Physician Discharge Summary  [name] ZYS:[id number] DOB: 01-19-1951 DOA: 01/02/2021  PCP: Denita Lung, MD  Admit date: 01/02/2021  Discharge date: 01/16/2021  Admitted From: Home  Disposition:  Home with Home PT.  Recommendations for Outpatient Follow-up:  Follow up with PCP in 1-2 weeks. Please obtain BMP/CBC in one week. Advised to follow up GI in 2 weeks. BP medications has been adjusted, Advised PCP to adjust BP medications as needed.  Home Health: Home PT. Equipment/Devices:None  Discharge Condition: Good CODE STATUS:Full code Diet recommendation: Heart Healthy   Brief Summary/ Hospital Course: This 70 year old Male with PMH  significant of Parkinson's disease, HTN,  hyperlipidemia, previous GI bleed who presented to the emergency department with complaints of black stools.  Patient was found to be anemic on presentation.  GI was consulted after the hospitalization.  He underwent EGD without finding of any source of bleeding.  Patient underwent colonoscopy without finding of any acute bleeding, it showed diverticulosis. Patient underwent CTA abdomen/pelvis that localized bleeding in the colon area.  IR was consulted but ablation was not successful. Patient became hypotensive in the evening of 01/09/2021 and had to be transferred to stepdown .  Blood pressure has improved and remained stable but then he developed acute confusion, most likely hospital delirium.  Neurology /psychiatry consulted as per request of the family. He was started briefly on Precedex for severe agitation.  Mental status has improved.  Psychiatry has adjusted his psych medications and started on Seroquel.  Hemoglobin has improved.  GI bleeding has resolved.  Patient is cleared from GI to be discharged.  Patient feels better,  PT recommended home PT.  Home health services been arranged.  Patient is being discharged home. Patient will follow up with GI in 2 weeks.  He was managed for below problems during  hospitalization.  Discharge Diagnoses:  Active Problems:   Acute GI bleeding   GI bleed  Acute lower GI bleeding/diverticulosis:  He presented with black/maroon-colored stools.  CT abdomen/pelvis was unremarkable.   CTA localized bleeding on the ascending colon but embolization was not successful as there was no significant bleeding during the procedure.   GI consulted and following.  EGD did not show any source of bleeding.  He underwent colonoscopy with finding of pandiverticulosis, but no active bleeding, nonbleeding internal hemorrhoids.  Diverticular bleeding is the most likely cause. Hemoglobin again dropped to the range of 7 on 01/12/21, he didnt have frank rectal bleeding but some maroon-colored stool observed.    Nuclear tagged scan showed aggregation of contrast in the hepatic flexure suggesting  recurrent bleed but no active bleeding was observed on intervening catheter angiography. Hemoglobin  remains stable above 8.  Likely the bleeding has stopped.   Acute blood loss anemia:  Suspected to be from diverticular bleed.  Transfused with 4 units of packed RBC during this hospitalization.   Hemoglobin stable in the range of 10.5 today.   Acute encephalopathy secondary to hospital-acquired delirium: > Improved. He had multiple episodes of confusion/agitation.  Most likely this is hospital-acquired delirium or secondary to anesthesia effect during the colonoscopy.  He has history of confusion/delirium secondary to anesthesia medications in the past.  As per the request of the wife, we  requested neurology/psychiatry consultation.  We will continue to monitor his mental status.  We will keep the room bright with day sunlight, we will attempt frequent reorientation.  Will minimize sedatives, narcotics. Started on low-dose Seroquel.    Patient mentation had improved and he  became alert and oriented but became confused again since 01/13/2021, needing to be started on Precedex drip briefly.   Psych consulted recommended to continue Seroquel and discontinue Haldol.  Patient does not need inpatient psych hospitalization.   Parkinsons disease:  continue Sinemet.   Essential hypertension:   Currently blood pressure stable.   Elevated troponin/chest discomfort:  Likely from supply demand ischemia from severe anemia.   Echocardiogram showed a preserved left ventricular fraction with grade 2 diastolic dysfunction.  No regional wall motion abnormality.   Continue statin.   No plan for further work-up.    BPH: On alfuzosin at home, stopped due to orthostatic hypotension.   Debility/deconditioning: Patient being followed by PT/OT   Right upper extremity swelling:  Most likely associated  with IV infiltration.   Venous duplex no DVT.    Discharge Instructions  Discharge Instructions     Call MD for:  difficulty breathing, headache or visual disturbances   Complete by: As directed    Call MD for:  persistant dizziness or light-headedness   Complete by: As directed    Call MD for:  persistant nausea and vomiting   Complete by: As directed    Diet - low sodium heart healthy   Complete by: As directed    Diet Carb Modified   Complete by: As directed    Discharge instructions   Complete by: As directed    Advised to follow up PCP in one week. Advised to follow up GI in 2 weeks. BP medications has been adjusted, We are requesting PCP to adjust BP medications as needed.   Discharge wound care:   Complete by: As directed    Follow up PCP   Increase activity slowly   Complete by: As directed       Allergies as of 01/16/2021   No Known Allergies      Medication List     STOP taking these medications    meloxicam 15 MG tablet Commonly known as: MOBIC   metoprolol tartrate 50 MG tablet Commonly known as: LOPRESSOR   predniSONE 10 MG tablet Commonly known as: DELTASONE       TAKE these medications    acetaminophen 500 MG tablet Commonly known as:  TYLENOL Take 500 mg by mouth every 6 (six) hours as needed for mild pain, fever or headache.   alfuzosin 10 MG 24 hr tablet Commonly known as: UROXATRAL TAKE 1 TABLET BY MOUTH ONCE A DAY What changed: how much to take   bismuth subsalicylate 458 MG chewable tablet Commonly known as: PEPTO BISMOL Chew 524 mg by mouth as needed for diarrhea or loose stools.   carbidopa-levodopa 25-100 MG tablet Commonly known as: SINEMET IR TAKE 1 AND 1/2 TABLETS BY MOUTH THREE TIMES DAILY What changed:  how much to take how to take this when to take this   Cerefolin 12-25-48-5 MG Tabs Take 1 tablet by mouth daily.   citalopram 20 MG tablet Commonly known as: CELEXA Take 1 tablet (20 mg total) by mouth daily. Start taking on: January 17, 2021 What changed:  medication strength how much to take   docusate sodium 100 MG capsule Commonly known as: COLACE Take 200 mg by mouth daily.   irbesartan 75 MG tablet Commonly known as: AVAPRO Take 1 tablet (75 mg total) by mouth every morning. Start taking on: January 17, 2021 What changed:  medication strength how much to take   isosorbide mononitrate 30 MG 24 hr tablet Commonly known as: IMDUR  Take 1 tablet (30 mg total) by mouth daily.  (Take 1/2 hour after taking tylenol)   multivitamin with minerals Tabs tablet Take 1 tablet by mouth daily.   nitroGLYCERIN 0.4 MG SL tablet Commonly known as: NITROSTAT Place 1 tablet (0.4 mg total) under the tongue every 5 (five) minutes as needed for chest pain.   pantoprazole 40 MG tablet Commonly known as: PROTONIX Take 40 mg by mouth 2 (two) times daily.   QUEtiapine 25 MG tablet Commonly known as: SEROQUEL Take 1 tablet (25 mg total) by mouth 3 (three) times daily.   simvastatin 40 MG tablet Commonly known as: ZOCOR TAKE 1 TABLET BY MOUTH AT BEDTIME What changed: how much to take   timolol 0.5 % ophthalmic solution Commonly known as: TIMOPTIC Place 1 drop into both eyes daily.    trihexyphenidyl 2 MG tablet Commonly known as: ARTANE TAKE 1/2 TABLET BY MOUTH 2 TIMES DAILY WITH A MEAL What changed:  how much to take how to take this when to take this   verapamil 120 MG CR tablet Commonly known as: CALAN-SR Take 1 tablet (120 mg total) by mouth daily. Start taking on: January 17, 2021 What changed:  medication strength how much to take when to take this   VITAMIN D PO Take 1 capsule by mouth daily.               Discharge Care Instructions  (From admission, onward)           Start     Ordered   01/16/21 0000  Discharge wound care:       Comments: Follow up PCP   01/16/21 1038            Follow-up Information     Denita Lung, MD Follow up in 1 week(s).   Specialty: Family Medicine Contact information: [place] [address] [phone]         [name], MD .   Specialty: Cardiology Contact information: [address] [place] [address] [phone]         [name], PA-C Follow up in 2 week(s).   Specialty: Physician Assistant Contact information: [address] [place] [address] [phone]                No Known Allergies  Consultations: GI   Procedures/Studies: NM GI Blood Loss  Result Date: 01/12/2021 CLINICAL DATA:  GI bleed.  Intermittent GI bleeding. EXAM: NUCLEAR MEDICINE GASTROINTESTINAL BLEEDING SCAN TECHNIQUE: Sequential abdominal images were obtained following intravenous administration of Tc-19m labeled red blood cells. RADIOPHARMACEUTICALS:  20.6 mCi Tc-47m pertechnetate in-vitro labeled red cells. COMPARISON:  Abdominal CTA 01/04/2021, as well as catheter angiography 01/04/2021. FINDINGS: Imaging obtained for 2 hours. There is faint contrast accumulating in the right upper quadrant, initial hour image 5. This faintly increases in intensity but does not travel along the course of the large bowel. This is at site  of prior active extravasation/diverticular bleed. IMPRESSION: Faint contrast accumulation in the right upper quadrant likely at the hepatic flexure of the colon. This is at site of prior diverticular bleed on CTA, and is concerning for persistent or recurrent bleed. No active bleeding was demonstrated on intervening catheter angiography. These results will be called to the ordering clinician or representative by the Radiologist Assistant, and communication documented in the PACS or [employer]. Electronically Signed   By: Keith Rake M.D.   On: 01/12/2021 16:52  IR Angiogram Visceral Selective  Result Date: 01/04/2021 INDICATION: 70 year old male with a history of lower GI hemorrhage, isolated to the ascending colon on recent CT angiogram, presents for mesenteric angiogram and possible embolization EXAM: ULTRASOUND-GUIDED ACCESS RIGHT COMMON FEMORAL ARTERY MESENTERIC ANGIOGRAM INVOLVING SMA, SUB SELECTION RIGHT COLIC ARTERY, AND SUPER SELECTIVE ANGIOGRAM OF TERMINAL RIGHT COLIC ARTERY BRANCHES EXOSEAL FOR HEMOSTASIS MEDICATIONS: None ANESTHESIA/SEDATION: Moderate (conscious) sedation was employed during this procedure. A total of Versed 1.0 mg and Fentanyl 25 mcg was administered intravenously. Moderate Sedation Time: 39 minutes. The patient's level of consciousness and vital signs were monitored continuously by radiology nursing throughout the procedure under my direct supervision. CONTRAST:  80 cc FLUOROSCOPY TIME:  Fluoroscopy Time: 5 minutes 36 seconds (2,325 mGy). COMPLICATIONS: None PROCEDURE: Informed consent was obtained from the patient following explanation of the procedure, risks, benefits and alternatives. The patient understands, agrees and consents for the procedure. All questions were addressed. A time out was performed prior to the initiation of the procedure. Maximal barrier sterile technique utilized including caps, mask, sterile gowns, sterile gloves, large sterile drape, hand  hygiene, and Betadine prep. Ultrasound survey of the right inguinal region was performed with images stored and sent to PACs, confirming patency of the vessel. A micropuncture needle was used access the right common femoral artery under ultrasound. With excellent arterial blood flow returned, and an .018 micro wire was passed through the needle, observed enter the abdominal aorta under fluoroscopy. The needle was removed, and a micropuncture sheath was placed over the wire. The inner dilator and wire were removed, and an 035 Bentson wire was advanced under fluoroscopy into the abdominal aorta. The sheath was removed and a standard 5 [place] vascular sheath was placed. The dilator was removed and the sheath was flushed. C2 Cobra catheter advanced on the Bentson wire. Cobra catheter was used to select the SMA. Angiogram was performed. We attempted to advance the C2 catheter on a standard Glidewire, which was unable given the tortuous SMA takeoff. A coaxial high-flow Renegade 135 cm microcatheter was then advanced on 014 fathom wire into the ileocolic artery. Angiogram was performed. The right colic artery was selected. Angiogram was performed. The microcatheter was then used to select terminal branches of the right colic artery independently, with angiogram performed at each artery in multiple obliquities using both breath hold and free breathing technique. Ultimately, we could not isolate the site of hemorrhage correlating to the findings on the CT angiogram as there was no extravasation of count tract, shunting, or angio dysplasia that was target oval for empiric embolization. All catheters wires were removed and Exoseal was deployed for hemostasis. Patient tolerated the procedure well and remained hemodynamically stable throughout. No complications were encountered and no significant blood loss. IMPRESSION: Status post ultrasound guided access right common femoral artery for SMA angiogram, super selective angiogram  the right colic arteries of interest in attempt to isolate the source of recent lower GI hemorrhage. No embolization was performed. ExoSeal for hemostasis. Signed, Dulcy Fanny. Dellia Nims, RPVI [facility] Radiology Electronically Signed   By: Corrie Mckusick D.O.   On: 01/04/2021 13:01   IR Angiogram Selective Each Additional Vessel  Result Date: 01/04/2021 INDICATION: 70 year old male with a history of lower GI hemorrhage, isolated to the ascending colon on recent CT angiogram, presents for mesenteric angiogram and possible embolization EXAM: ULTRASOUND-GUIDED ACCESS RIGHT COMMON FEMORAL ARTERY MESENTERIC ANGIOGRAM INVOLVING SMA, SUB SELECTION RIGHT COLIC ARTERY, AND SUPER SELECTIVE ANGIOGRAM OF TERMINAL RIGHT  COLIC ARTERY BRANCHES EXOSEAL FOR HEMOSTASIS MEDICATIONS: None ANESTHESIA/SEDATION: Moderate (conscious) sedation was employed during this procedure. A total of Versed 1.0 mg and Fentanyl 25 mcg was administered intravenously. Moderate Sedation Time: 39 minutes. The patient's level of consciousness and vital signs were monitored continuously by radiology nursing throughout the procedure under my direct supervision. CONTRAST:  80 cc FLUOROSCOPY TIME:  Fluoroscopy Time: 5 minutes 36 seconds (2,325 mGy). COMPLICATIONS: None PROCEDURE: Informed consent was obtained from the patient following explanation of the procedure, risks, benefits and alternatives. The patient understands, agrees and consents for the procedure. All questions were addressed. A time out was performed prior to the initiation of the procedure. Maximal barrier sterile technique utilized including caps, mask, sterile gowns, sterile gloves, large sterile drape, hand hygiene, and Betadine prep. Ultrasound survey of the right inguinal region was performed with images stored and sent to PACs, confirming patency of the vessel. A micropuncture needle was used access the right common femoral artery under  ultrasound. With excellent arterial blood flow returned, and an .018 micro wire was passed through the needle, observed enter the abdominal aorta under fluoroscopy. The needle was removed, and a micropuncture sheath was placed over the wire. The inner dilator and wire were removed, and an 035 Bentson wire was advanced under fluoroscopy into the abdominal aorta. The sheath was removed and a standard 5 [place] vascular sheath was placed. The dilator was removed and the sheath was flushed. C2 Cobra catheter advanced on the Bentson wire. Cobra catheter was used to select the SMA. Angiogram was performed. We attempted to advance the C2 catheter on a standard Glidewire, which was unable given the tortuous SMA takeoff. A coaxial high-flow Renegade 135 cm microcatheter was then advanced on 014 fathom wire into the ileocolic artery. Angiogram was performed. The right colic artery was selected. Angiogram was performed. The microcatheter was then used to select terminal branches of the right colic artery independently, with angiogram performed at each artery in multiple obliquities using both breath hold and free breathing technique. Ultimately, we could not isolate the site of hemorrhage correlating to the findings on the CT angiogram as there was no extravasation of count tract, shunting, or angio dysplasia that was target oval for empiric embolization. All catheters wires were removed and Exoseal was deployed for hemostasis. Patient tolerated the procedure well and remained hemodynamically stable throughout. No complications were encountered and no significant blood loss. IMPRESSION: Status post ultrasound guided access right common femoral artery for SMA angiogram, super selective angiogram the right colic arteries of interest in attempt to isolate the source of recent lower GI hemorrhage. No embolization was performed. ExoSeal for hemostasis. Signed, Dulcy Fanny. Dellia Nims, RPVI Vascular and Interventional Radiology  Specialists [facility] Radiology Electronically Signed   By: Corrie Mckusick D.O.   On: 01/04/2021 13:01   IR Angiogram Selective Each Additional Vessel  Result Date: 01/04/2021 INDICATION: 70 year old male with a history of lower GI hemorrhage, isolated to the ascending colon on recent CT angiogram, presents for mesenteric angiogram and possible embolization EXAM: ULTRASOUND-GUIDED ACCESS RIGHT COMMON FEMORAL ARTERY MESENTERIC ANGIOGRAM INVOLVING SMA, SUB SELECTION RIGHT COLIC ARTERY, AND SUPER SELECTIVE ANGIOGRAM OF TERMINAL RIGHT COLIC ARTERY BRANCHES EXOSEAL FOR HEMOSTASIS MEDICATIONS: None ANESTHESIA/SEDATION: Moderate (conscious) sedation was employed during this procedure. A total of Versed 1.0 mg and Fentanyl 25 mcg was administered intravenously. Moderate Sedation Time: 39 minutes. The patient's level of consciousness and vital signs were monitored continuously by radiology nursing throughout the procedure under my direct supervision. CONTRAST:  80 cc FLUOROSCOPY TIME:  Fluoroscopy Time: 5 minutes 36 seconds (2,325 mGy). COMPLICATIONS: None PROCEDURE: Informed consent was obtained from the patient following explanation of the procedure, risks, benefits and alternatives. The patient understands, agrees and consents for the procedure. All questions were addressed. A time out was performed prior to the initiation of the procedure. Maximal barrier sterile technique utilized including caps, mask, sterile gowns, sterile gloves, large sterile drape, hand hygiene, and Betadine prep. Ultrasound survey of the right inguinal region was performed with images stored and sent to PACs, confirming patency of the vessel. A micropuncture needle was used access the right common femoral artery under ultrasound. With excellent arterial blood flow returned, and an .018 micro wire was passed through the needle, observed enter the abdominal aorta under fluoroscopy. The needle was removed, and a micropuncture sheath was placed  over the wire. The inner dilator and wire were removed, and an 035 Bentson wire was advanced under fluoroscopy into the abdominal aorta. The sheath was removed and a standard 5 [place] vascular sheath was placed. The dilator was removed and the sheath was flushed. C2 Cobra catheter advanced on the Bentson wire. Cobra catheter was used to select the SMA. Angiogram was performed. We attempted to advance the C2 catheter on a standard Glidewire, which was unable given the tortuous SMA takeoff. A coaxial high-flow Renegade 135 cm microcatheter was then advanced on 014 fathom wire into the ileocolic artery. Angiogram was performed. The right colic artery was selected. Angiogram was performed. The microcatheter was then used to select terminal branches of the right colic artery independently, with angiogram performed at each artery in multiple obliquities using both breath hold and free breathing technique. Ultimately, we could not isolate the site of hemorrhage correlating to the findings on the CT angiogram as there was no extravasation of count tract, shunting, or angio dysplasia that was target oval for empiric embolization. All catheters wires were removed and Exoseal was deployed for hemostasis. Patient tolerated the procedure well and remained hemodynamically stable throughout. No complications were encountered and no significant blood loss. IMPRESSION: Status post ultrasound guided access right common femoral artery for SMA angiogram, super selective angiogram the right colic arteries of interest in attempt to isolate the source of recent lower GI hemorrhage. No embolization was performed. ExoSeal for hemostasis. Signed, Dulcy Fanny. Dellia Nims, RPVI [facility] Inter[facility] Radiology Specialists [facility] Radiology Electronically Signed   By: Corrie Mckusick D.O.   On: 01/04/2021 13:01   IR US Guide Vasc Access Right  Result Date: 01/04/2021 INDICATION: 70 year old male with a history of lower GI hemorrhage,  isolated to the ascending colon on recent CT angiogram, presents for mesenteric angiogram and possible embolization EXAM: ULTRASOUND-GUIDED ACCESS RIGHT COMMON FEMORAL ARTERY MESENTERIC ANGIOGRAM INVOLVING SMA, SUB SELECTION RIGHT COLIC ARTERY, AND SUPER SELECTIVE ANGIOGRAM OF TERMINAL RIGHT COLIC ARTERY BRANCHES EXOSEAL FOR HEMOSTASIS MEDICATIONS: None ANESTHESIA/SEDATION: Moderate (conscious) sedation was employed during this procedure. A total of Versed 1.0 mg and Fentanyl 25 mcg was administered intravenously. Moderate Sedation Time: 39 minutes. The patient's level of consciousness and vital signs were monitored continuously by radiology nursing throughout the procedure under my direct supervision. CONTRAST:  80 cc FLUOROSCOPY TIME:  Fluoroscopy Time: 5 minutes 36 seconds (2,325 mGy). COMPLICATIONS: None PROCEDURE: Informed consent was obtained from the patient following explanation of the procedure, risks, benefits and alternatives. The patient understands, agrees and consents for the procedure. All questions were addressed. A time out was performed prior to the initiation of the procedure.  Maximal barrier sterile technique utilized including caps, mask, sterile gowns, sterile gloves, large sterile drape, hand hygiene, and Betadine prep. Ultrasound survey of the right inguinal region was performed with images stored and sent to PACs, confirming patency of the vessel. A micropuncture needle was used access the right common femoral artery under ultrasound. With excellent arterial blood flow returned, and an .018 micro wire was passed through the needle, observed enter the abdominal aorta under fluoroscopy. The needle was removed, and a micropuncture sheath was placed over the wire. The inner dilator and wire were removed, and an 035 Bentson wire was advanced under fluoroscopy into the abdominal aorta. The sheath was removed and a standard 5 [place] vascular sheath was placed. The dilator was removed and the sheath  was flushed. C2 Cobra catheter advanced on the Bentson wire. Cobra catheter was used to select the SMA. Angiogram was performed. We attempted to advance the C2 catheter on a standard Glidewire, which was unable given the tortuous SMA takeoff. A coaxial high-flow Renegade 135 cm microcatheter was then advanced on 014 fathom wire into the ileocolic artery. Angiogram was performed. The right colic artery was selected. Angiogram was performed. The microcatheter was then used to select terminal branches of the right colic artery independently, with angiogram performed at each artery in multiple obliquities using both breath hold and free breathing technique. Ultimately, we could not isolate the site of hemorrhage correlating to the findings on the CT angiogram as there was no extravasation of count tract, shunting, or angio dysplasia that was target oval for empiric embolization. All catheters wires were removed and Exoseal was deployed for hemostasis. Patient tolerated the procedure well and remained hemodynamically stable throughout. No complications were encountered and no significant blood loss. IMPRESSION: Status post ultrasound guided access right common femoral artery for SMA angiogram, super selective angiogram the right colic arteries of interest in attempt to isolate the source of recent lower GI hemorrhage. No embolization was performed. ExoSeal for hemostasis. Signed, Dulcy Fanny. Dellia Nims, RPVI [facility] Radiology Electronically Signed   By: Corrie Mckusick D.O.   On: 01/04/2021 13:01   ECHOCARDIOGRAM COMPLETE  Result Date: 01/07/2021    ECHOCARDIOGRAM REPORT   Patient Name:   [name] Date of Exam: 01/07/2021 Medical Rec #:  [id number]       Height:       70.0 in Accession #:    [id number]      Weight:       153.4 lb Date of Birth:  [birth year]       BSA:          1.865 m Patient Age:    21 years        BP:           109/60 mmHg Patient Gender: M                HR:           75 bpm. Exam Location:  Inpatient Procedure: 2D Echo, Cardiac Doppler and Color Doppler Indications:    Chest pain, elevated troponin  History:        Patient has prior history of Echocardiogram examinations, most                 recent 01/20/2012. Signs/Symptoms:Chest Pain; Risk                 Factors:Dyslipidemia and Hypertension. 01/20/2012 stress echo.  Sonographer:    Luisa Hart  RDCS Referring Phys: [id number] [place]  1. Left ventricular ejection fraction, by estimation, is 60 to 65%. The left ventricle has normal function. The left ventricle has no regional wall motion abnormalities. Left ventricular diastolic parameters are consistent with Grade II diastolic dysfunction (pseudonormalization).  2. Right ventricular systolic function is normal. The right ventricular size is mildly enlarged. Tricuspid regurgitation signal is inadequate for assessing PA pressure.  3. Left atrial size was moderately dilated.  4. The mitral valve is normal in structure. No evidence of mitral valve regurgitation. No evidence of mitral stenosis.  5. The aortic valve is tricuspid. Aortic valve regurgitation is trivial. Mild to moderate aortic valve sclerosis/calcification is present, without any evidence of aortic stenosis.  6. The inferior vena cava is normal in size with greater than 50% respiratory variability, suggesting right atrial pressure of 3 mmHg. FINDINGS  Left Ventricle: Left ventricular ejection fraction, by estimation, is 60 to 65%. The left ventricle has normal function. The left ventricle has no regional wall motion abnormalities. The left ventricular internal cavity size was normal in size. There is  no left ventricular hypertrophy. Left ventricular diastolic parameters are consistent with Grade II diastolic dysfunction (pseudonormalization). Right Ventricle: The right ventricular size is mildly enlarged.Right ventricular systolic function is normal. Tricuspid regurgitation signal is  inadequate for assessing PA pressure. The tricuspid regurgitant velocity is 2.33 m/s, and with an assumed right atrial pressure of 3 mmHg, the estimated right ventricular systolic pressure is 81.8 mmHg. Left Atrium: Left atrial size was moderately dilated. Right Atrium: Right atrial size was normal in size. Pericardium: Trivial pericardial effusion is present. Mitral Valve: The mitral valve is normal in structure. No evidence of mitral valve regurgitation. No evidence of mitral valve stenosis. Tricuspid Valve: The tricuspid valve is normal in structure. Tricuspid valve regurgitation is mild . No evidence of tricuspid stenosis. Aortic Valve: The aortic valve is tricuspid. Aortic valve regurgitation is trivial. Aortic regurgitation PHT measures 1040 msec. Mild to moderate aortic valve sclerosis/calcification is present, without any evidence of aortic stenosis. Aortic valve mean gradient measures 4.5 mmHg. Aortic valve peak gradient measures 8.0 mmHg. Aortic valve area, by VTI measures 2.71 cm. Pulmonic Valve: The pulmonic valve was normal in structure. Pulmonic valve regurgitation is trivial. No evidence of pulmonic stenosis. Aorta: The aortic root is normal in size and structure. Venous: The inferior vena cava is normal in size with greater than 50% respiratory variability, suggesting right atrial pressure of 3 mmHg. IAS/Shunts: No atrial level shunt detected by color flow Doppler.  LEFT VENTRICLE PLAX 2D LVIDd:         4.70 cm     Diastology LVIDs:         2.70 cm     LV e' medial:    6.09 cm/s LV PW:         1.10 cm     LV E/e' medial:  13.6 LV IVS:        1.00 cm     LV e' lateral:   7.51 cm/s LVOT diam:     2.30 cm     LV E/e' lateral: 11.0 LV SV:         84 LV SV Index:   45 LVOT Area:     4.15 cm  LV Volumes (MOD) LV vol d, MOD A2C: 48.9 ml LV vol d, MOD A4C: 94.0 ml LV vol s, MOD A2C: 16.9 ml LV vol s, MOD A4C: 27.6 ml LV SV MOD A2C:  32.0 ml LV SV MOD A4C:     94.0 ml LV SV MOD BP:      47.7 ml RIGHT  VENTRICLE RV Basal diam:  4.40 cm RV Mid diam:    3.20 cm RV S prime:     16.00 cm/s TAPSE (M-mode): 2.1 cm LEFT ATRIUM              Index       RIGHT ATRIUM           Index LA diam:        3.80 cm  2.04 cm/m  RA Area:     17.60 cm LA Vol (A2C):   141.0 ml 75.60 ml/m RA Volume:   47.20 ml  25.31 ml/m LA Vol (A4C):   65.4 ml  35.07 ml/m LA Biplane Vol: 105.0 ml 56.30 ml/m  AORTIC VALVE                   PULMONIC VALVE AV Area (Vmax):    2.61 cm    PV Vmax:       0.84 m/s AV Area (Vmean):   2.48 cm    PV Vmean:      67.800 cm/s AV Area (VTI):     2.71 cm    PV VTI:        0.222 m AV Vmax:           141.00 cm/s PV Peak grad:  2.8 mmHg AV Vmean:          96.000 cm/s PV Mean grad:  2.0 mmHg AV VTI:            0.312 m AV Peak Grad:      8.0 mmHg AV Mean Grad:      4.5 mmHg LVOT Vmax:         88.60 cm/s LVOT Vmean:        57.400 cm/s LVOT VTI:          0.203 m LVOT/AV VTI ratio: 0.65 AI PHT:            1040 msec  AORTA Ao Root diam: 3.60 cm Ao Asc diam:  3.10 cm MITRAL VALVE               TRICUSPID VALVE MV Area (PHT): 2.63 cm    TR Peak grad:   21.7 mmHg MV Decel Time: 288 msec    TR Vmax:        233.00 cm/s MV E velocity: 82.60 cm/s MV A velocity: 69.50 cm/s  SHUNTS MV E/A ratio:  1.19        Systemic VTI:  0.20 m                            Systemic Diam: 2.30 cm Kirk Ruths MD Electronically signed by Kirk Ruths MD Signature Date/Time: 01/07/2021/11:42:42 AM    Final    OCT, Retina - OU - Both Eyes  Result Date: 01/02/2021 Right Eye Quality was good. Scan locations included subfoveal. Central Foveal Thickness: 205. Findings include outer retinal atrophy, central retinal atrophy. Left Eye Quality was good. Scan locations included subfoveal. Central Foveal Thickness: 381. Progression has been stable. Notes Diffuse macular atrophy right eye no interval change Mild diffuse macular thickening left eye with no active CME.  No change in foveal elevation over the last 2 years. Minor CME superonasal, but this  is old and involutional and unchanged for years with  no encroachment to center of the fovea  Color Fundus Photography Optos - OU - Both Eyes  Result Date: 01/02/2021 Right Eye Progression has been stable. Notes OD with cloudy media secondary to corneal opacity, striae from corneal edema Retina attached, optic nerve atrophy good PRP peripheral OS, mild temporal optic atrophy. 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, no active macular edema  VAS US CAROTID  Result Date: 01/02/2021 Carotid Arterial Duplex Study Patient Name:  [name]  Date of Exam:   01/01/2021 Medical Rec #: [id number]        Accession #:    [id number] Date of Birth: [birth year]        Patient Gender: M Patient Age:   27Y Exam Location:  [facility] Procedure:      VAS US CAROTID Referring Phys: [id number] DAVID W HARDING --------------------------------------------------------------------------------  Indications:       Patient reports intermittent tingling sensations in the left                    jaw area, across the left chest area at surround the front                    area of the left knee. Symptoms do not always come in all                    areas at the same time. These symptoms have been going on for                    one to two months. He also states he has been having                    headaches for the past week but relates it to a new                    medication he is taking (could not recall the name). 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 Lightheadedness when he has to look                    over the right shoulder while driving. He denies any other                    cerebrovascular symptoms. Risk Factors:      Hyperlipidemia, past history of smoking. Comparison Study:  NA Performing Technologist: Sharlett Iles RVT  Examination Guidelines: A complete evaluation includes B-mode imaging, spectral Doppler, color Doppler, and power Doppler as  needed of all accessible portions of each vessel. Bilateral testing is considered an integral part of a complete examination. Limited examinations for reoccurring indications may be performed as noted.  Right Carotid Findings: +----------+--------+--------+--------+------------------+--------+           PSV cm/sEDV cm/sStenosisPlaque DescriptionComments +----------+--------+--------+--------+------------------+--------+ CCA Prox  53      6                                 tortuous +----------+--------+--------+--------+------------------+--------+ CCA Distal57      13                                         +----------+--------+--------+--------+------------------+--------+  ICA Prox  43      10      1-39%   heterogenous               +----------+--------+--------+--------+------------------+--------+ ICA Mid   41      17              heterogenous               +----------+--------+--------+--------+------------------+--------+ ICA Distal35      17                                         +----------+--------+--------+--------+------------------+--------+ ECA       77      10                                         +----------+--------+--------+--------+------------------+--------+ +----------+--------+-------+---------+-------------------+           PSV cm/sEDV cmsDescribe Arm Pressure (mmHG) +----------+--------+-------+---------+-------------------+ [id number]            Turbulent117                 +----------+--------+-------+---------+-------------------+ +---------+--------+--+--------+-+---------------------------+ VertebralPSV cm/s26EDV cm/s6Antegrade and Small caliber +---------+--------+--+--------+-+---------------------------+  Left Carotid Findings: +----------+--------+--------+--------+------------------+--------+           PSV cm/sEDV cm/sStenosisPlaque DescriptionComments  +----------+--------+--------+--------+------------------+--------+ CCA Prox  81      14                                         +----------+--------+--------+--------+------------------+--------+ CCA Distal78      15                                         +----------+--------+--------+--------+------------------+--------+ ICA Prox  101     33      1-39%   heterogenous               +----------+--------+--------+--------+------------------+--------+ ICA Mid   49      17                                         +----------+--------+--------+--------+------------------+--------+ ICA Distal52      24                                         +----------+--------+--------+--------+------------------+--------+ ECA       81      10                                         +----------+--------+--------+--------+------------------+--------+ +----------+--------+--------+----------------+-------------------+           PSV cm/sEDV cm/sDescribe        Arm Pressure (mmHG) +----------+--------+--------+----------------+-------------------+ Subclavian160             Multiphasic, [id number]                 +----------+--------+--------+----------------+-------------------+ +---------+--------+--+--------+--+---------+  VertebralPSV cm/s37EDV cm/s13Antegrade +---------+--------+--+--------+--+---------+   Summary: Right Carotid: Velocities in the right ICA are consistent with a 1-39% stenosis. Left Carotid: Velocities in the left ICA are consistent with a 1-39% stenosis. Vertebrals:  Bilateral vertebral arteries demonstrate antegrade flow. Small              caliber right vertebral artery. Subclavians: Right subclavian artery flow was disturbed. Normal flow              hemodynamics were seen in the left subclavian artery. *See table(s) above for measurements and observations.  Electronically signed by Larae Grooms MD on 01/02/2021 at 2:23:23 PM.    Final    VAS [place] UPPER  EXTREMITY VENOUS DUPLEX  Result Date: 01/16/2021 UPPER VENOUS STUDY  Patient Name:  [name]  Date of Exam:   01/14/2021 Medical Rec #: [id number]        Accession #:    [id number] Date of Birth: 1950/11/18        Patient Gender: M Patient Age:   70Y Exam Location:  [facility] Procedure:      VAS [place] UPPER EXTREMITY VENOUS DUPLEX Referring Phys: [id number] AMRIT ADHIKARI --------------------------------------------------------------------------------  Indications: Swelling Risk Factors: None identified. Comparison Study: No prior studies. Performing Technologist: Oliver Hum RVT  Examination Guidelines: A complete evaluation includes B-mode imaging, spectral Doppler, color Doppler, and power Doppler as needed of all accessible portions of each vessel. Bilateral testing is considered an integral part of a complete examination. Limited examinations for reoccurring indications may be performed as noted.  Right Findings: +----------+------------+---------+-----------+----------+-------+ RIGHT     CompressiblePhasicitySpontaneousPropertiesSummary +----------+------------+---------+-----------+----------+-------+ IJV           Full       Yes       Yes                      +----------+------------+---------+-----------+----------+-------+ Subclavian    Full       Yes       Yes                      +----------+------------+---------+-----------+----------+-------+ Axillary      Full       Yes       Yes                      +----------+------------+---------+-----------+----------+-------+ Brachial      Full       Yes       Yes                      +----------+------------+---------+-----------+----------+-------+ Radial        Full                                          +----------+------------+---------+-----------+----------+-------+ Ulnar         Full                                           +----------+------------+---------+-----------+----------+-------+ Cephalic      None                                  Chronic +----------+------------+---------+-----------+----------+-------+ Basilic  Full                                          +----------+------------+---------+-----------+----------+-------+ Cephalic thrombus noted within the mid to proximal forearm.  Left Findings: +----------+------------+---------+-----------+----------+-------+ LEFT      CompressiblePhasicitySpontaneousPropertiesSummary +----------+------------+---------+-----------+----------+-------+ Subclavian    Full       Yes       Yes                      +----------+------------+---------+-----------+----------+-------+  Summary:  Right: No evidence of deep vein thrombosis in the upper extremity. 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  Left: No evidence of thrombosis in the subclavian.  *See table(s) above for measurements and observations.  Diagnosing physician: Jamelle Haring Electronically signed by Jamelle Haring on 01/16/2021 at 12:36:24 PM.    Final    CT Angio Abd/Pel w/ and/or w/o  Result Date: 01/04/2021 CLINICAL DATA:  70 year old male with melena and maroon colored stools. Evaluate for active GI bleed. EXAM: CTA ABDOMEN AND PELVIS WITHOUT AND WITH CONTRAST TECHNIQUE: Multidetector CT imaging of the abdomen and pelvis was performed using the standard protocol during bolus administration of intravenous contrast. Multiplanar reconstructed images and MIPs were obtained and reviewed to evaluate the vascular anatomy. CONTRAST:  188mL OMNIPAQUE IOHEXOL 350 MG/ML SOLN COMPARISON:  Prior CT abdomen/pelvis 04/18/2018; CT chest 10/08/2016 FINDINGS: VASCULAR Aorta: Scattered atherosclerotic vascular calcifications throughout the abdominal aorta. No evidence of aneurysm or dissection. Celiac: Patent without evidence of aneurysm, dissection,  vasculitis or significant stenosis. SMA: Patent without evidence of aneurysm, dissection, vasculitis or significant stenosis. Renals: Mild stenosis at the origin of the right renal artery secondary to fibrofatty atherosclerotic plaque. Left renal artery is widely patent. No accessory renal arteries. IMA: Focal moderate to high-grade stenosis at the origin. The remainder of the artery is patent. Inflow: The iliac arteries are highly tortuous. Scattered atherosclerotic calcifications without significant stenosis, occlusion, dissection or aneurysm. Proximal Outflow: Bilateral common femoral and visualized portions of the superficial and profunda femoral arteries are patent without evidence of aneurysm, dissection, vasculitis or significant stenosis. Veins: No focal venous abnormality. Review of the MIP images confirms the above findings. NON-VASCULAR Lower chest: No acute abnormality. Greater than 2 year stability of small pulmonary nodules (images 17 and 28 of series 10) consistent with benign granuloma. Trace dependent atelectasis. The visualized heart is normal in size. No pericardial effusion. No significant abnormality of the visualized distal thoracic esophagus. Hepatobiliary: No focal liver abnormality is seen. No gallstones, gallbladder wall thickening, or biliary dilatation. Pancreas: Unremarkable. No pancreatic ductal dilatation or surrounding inflammatory changes. Spleen: Normal in size without focal abnormality. Adrenals/Urinary Tract: The adrenal glands are normal. No hydronephrosis, nephrolithiasis or enhancing renal mass. Large simple cysts exophytic from the upper pole of the right kidney in the anterior interpolar left kidney. The ureters and bladder are unremarkable. Stomach/Bowel: Active extravasation of arterial phase contrast into a diverticulum of the ascending colon just proximal to the hepatic flexure (images 80-94) with evidence of blooming and expansion of the contrast blush on the delayed  phase images. Findings are consistent with acute diverticular hemorrhage. Stomach and proximal small bowel are within normal limits. No focal bowel wall thickening or evidence of bowel obstruction. Lymphatic: No suspicious lymphadenopathy. Reproductive: Prostatomegaly. Other: Fat containing umbilical hernia.  No evidence of ascites. Musculoskeletal: No acute fracture or aggressive  appearing lytic or blastic osseous lesion. Mild grade 1 anterolisthesis of L4 on L5 without spondylo lysis. Associated lower lumbar degenerative disc disease and facet arthropathy. IMPRESSION: 1. Positive for active diverticular bleed in the ascending colon just proximal to the hepatic flexure. 2. Scattered atherosclerotic vascular calcifications without aneurysm, dissection or occlusion. Aortic Atherosclerosis (ICD10-I70.0). 3. Progressive lower lumbar degenerative disc disease and facet arthropathy now with mild grade 1 anterolisthesis of L4 on L5. 4. Additional ancillary findings as above without significant interval change compared to prior imaging. Electronically Signed   By: Jacqulynn Cadet M.D.   On: 01/04/2021 09:38    EGD, colonoscopy.  Subjective: Patient was seen and examined at bedside.  Overnight events noted.  Patient reports feeling better,  hemoglobin is stable.   Denies any further GI bleeding.  Cleared from GI to be discharged.  Patient is alert and oriented back to his baseline mental status.  Discharge Exam: Vitals:   01/16/21 0916 01/16/21 0946  BP: (!) 149/92 129/87  Pulse: 71 73  Resp: 17 18  Temp: 97.8 F (36.6 C)   SpO2: 100% 100%   Vitals:   01/16/21 0137 01/16/21 0627 01/16/21 0916 01/16/21 0946  BP:  (!) 188/107 (!) 149/92 129/87  Pulse:  (!) 55 71 73  Resp:  18 17 18   Temp:  (!) 97.5 F (36.4 C) 97.8 F (36.6 C)   TempSrc:  Oral  Oral  SpO2:  100% 100% 100%  Weight: 69.6 kg     Height: 5\' 10"  (1.778 m)       General: Pt is alert, awake, not in acute distress Cardiovascular:  RRR, S1/S2 +, no rubs, no gallops Respiratory: CTA bilaterally, no wheezing, no rhonchi Abdominal: Soft, NT, ND, bowel sounds + Extremities: no edema, no cyanosis    The results of significant diagnostics from this hospitalization (including imaging, microbiology, ancillary and laboratory) are listed below for reference.     Microbiology: No results found for this or any previous visit (from the past 240 hour(s)).   Labs: BNP (last 3 results) No results for input(s): BNP in the last 8760 hours. Basic Metabolic Panel: Recent Labs  Lab 01/10/21 0303 01/11/21 0259 01/14/21 0250 01/15/21 0250 01/16/21 0557  NA 138 138 137 137 138  K 3.7 3.6 3.9 3.5 4.0  CL 111 109 107 108 107  CO2 23 25 23 24 25   GLUCOSE 109* 88 87 91 96  BUN 15 12 23  28* 22  CREATININE 1.07 1.06 1.11 1.04 1.08  CALCIUM 8.2* 8.0* 8.6* 8.2* 8.5*   Liver Function Tests: No results for input(s): AST, ALT, ALKPHOS, BILITOT, PROT, ALBUMIN in the last 168 hours. No results for input(s): LIPASE, AMYLASE in the last 168 hours. Recent Labs  Lab 01/10/21 1131  AMMONIA 20   CBC: Recent Labs  Lab 01/13/21 2031 01/14/21 0250 01/14/21 1838 01/15/21 1036 01/16/21 0557  WBC 12.5* 13.4* 8.1 8.8 8.5  HGB 9.1* 8.9* 8.4* 9.5* 10.8*  HCT 27.2* 27.4* 25.6* 29.5* 33.2*  MCV 91.9 93.8 94.1 94.2 92.5  PLT 200 202 131* 221 288   Cardiac Enzymes: No results for input(s): CKTOTAL, CKMB, CKMBINDEX, TROPONINI in the last 168 hours. BNP: Invalid input(s): POCBNP CBG: No results for input(s): GLUCAP in the last 168 hours. D-Dimer No results for input(s): DDIMER in the last 72 hours. Hgb A1c No results for input(s): HGBA1C in the last 72 hours. Lipid Profile No results for input(s): CHOL, HDL, LDLCALC, TRIG, CHOLHDL, LDLDIRECT in the last 72  hours. Thyroid function studies No results for input(s): TSH, T4TOTAL, T3FREE, THYROIDAB in the last 72 hours.  Invalid input(s): FREET3 Anemia work up No results for input(s):  VITAMINB12, FOLATE, FERRITIN, TIBC, IRON, RETICCTPCT in the last 72 hours. Urinalysis    Component Value Date/Time   COLORURINE YELLOW 04/18/2018 1019   APPEARANCEUR CLEAR 04/18/2018 1019   LABSPEC 1.010 01/17/2020 1502   PHURINE 7.0 04/18/2018 1019   GLUCOSEU NEGATIVE 04/18/2018 1019   HGBUR NEGATIVE 04/18/2018 1019   BILIRUBINUR negative 01/17/2020 1502   KETONESUR negative 01/17/2020 1502   KETONESUR NEGATIVE 04/18/2018 1019   PROTEINUR negative 01/17/2020 1502   PROTEINUR NEGATIVE 04/18/2018 1019   UROBILINOGEN 0.2 03/14/2014 2107   NITRITE Negative 01/17/2020 1502   NITRITE NEGATIVE 04/18/2018 1019   LEUKOCYTESUR Negative 01/17/2020 1502   Sepsis Labs Invalid input(s): PROCALCITONIN,  WBC,  LACTICIDVEN Microbiology No results found for this or any previous visit (from the past 240 hour(s)).   Time coordinating discharge: Over 30 minutes  SIGNED:   Shawna Clamp, MD  [facility] 01/16/2021, 12:50 PM   If 7PM-7AM, please contact night-coverage [email or web address]

## 2021-01-16 NOTE — TOC Transition Note (Signed)
Transition of Care [facility]) - CM/SW Discharge Note   Patient Details  Name: [name] MRN: [id number] Date of Birth: 06-Jun-1951  Transition of Care [facility]) CM/SW Contact:  Ross Ludwig, LCSW Phone Number: 01/16/2021, 1:20 PM   Clinical Narrative:     Patient will be going home with home health PT through [facility]  CSW signing off please reconsult with any other social work needs, home health agency has been notified of planned discharge.    Final next level of care: [place] Barriers to Discharge: Barriers Resolved   Patient Goals and CMS Choice Patient states their goals for this hospitalization and ongoing recovery are:: To return back home with home health. CMS Medicare.gov Compare Post Acute Care list provided to:: Patient Represenative (must comment) Choice offered to / list presented to : Spouse  Discharge Placement  Home with home health PT.                     Discharge Plan and Services                          HH Arranged: PT [facility] Agency: [place] Date [facility] Contacted: 01/16/21 Time [place]: [phone] Representative spoke with at [place]: Amy  Social Determinants of Health ([place]) Interventions     Readmission Risk Interventions No flowsheet data found.

## 2021-01-17 ENCOUNTER — Encounter: Payer: 59 | Admitting: Family Medicine

## 2021-01-17 ENCOUNTER — Other Ambulatory Visit (HOSPITAL_COMMUNITY): Payer: Self-pay

## 2021-01-17 ENCOUNTER — Telehealth: Payer: Self-pay

## 2021-01-17 NOTE — Telephone Encounter (Signed)
Patient will call back to discuss transition of care.

## 2021-01-17 NOTE — Telephone Encounter (Signed)
Transition Care Management Follow-up Telephone Call Date of discharge and from where: 01/16/21 How have you been since you were released from the hospital? no Any questions or concerns? No  Items Reviewed: Did the pt receive and understand the discharge instructions provided? Yes  Medications obtained and verified? Yes  Other? No  Any new allergies since your discharge? No  Dietary orders reviewed? No Do you have support at home? Yes   Home Care and Equipment/Supplies: Were home health services ordered? no If so, what is the name of the agency? N/a  Has the agency set up a time to come to the patient's home? not applicable Were any new equipment or medical supplies ordered?  No What is the name of the medical supply agency? N/a Were you able to get the supplies/equipment? not applicable Do you have any questions related to the use of the equipment or supplies? No  Functional Questionnaire: (I = Independent and D = Dependent) ADLs: I  Bathing/Dressing- I  Meal Prep- I  Eating- I  Maintaining continence- I  Transferring/Ambulation- I  Managing Meds- I  Follow up appointments reviewed:  PCP Hospital f/u appt confirmed? Yes  Scheduled to see Dr. Redmond School on  01/28/21@ 10:30am. [place] [facility] f/u appt confirmed? No  Scheduled to see n/a   Are transportation arrangements needed? Yes  If their condition worsens, is the pt aware to call PCP or go to the Emergency Dept.? Yes Was the patient provided with contact information for the PCP's office or ED? Yes Was to pt encouraged to call back with questions or concerns? Yes

## 2021-01-21 ENCOUNTER — Telehealth: Payer: Self-pay | Admitting: Family Medicine

## 2021-01-21 NOTE — Telephone Encounter (Signed)
Pt's wife called and states that pt was recently discharged from hospital and needs to be set up for outpatient physical therapy. Please advise wife at [phone].

## 2021-01-22 NOTE — Telephone Encounter (Signed)
I got him scheduled for his hospital f/u here on 01/28/21. I did not get him setup for any PT. They usually come in for there hospital f/u and then you decide what other referrals are needed unless the hospital orders it when they are discharged, in that case the hospital sends a referral for PT.

## 2021-01-27 LAB — VITAMIN B1: Vitamin B1 (Thiamine): 93 nmol/L (ref 66.5–200.0)

## 2021-01-28 ENCOUNTER — Other Ambulatory Visit: Payer: Self-pay

## 2021-01-28 ENCOUNTER — Ambulatory Visit (INDEPENDENT_AMBULATORY_CARE_PROVIDER_SITE_OTHER): Payer: Medicare Other | Admitting: Family Medicine

## 2021-01-28 VITALS — BP 110/72 | HR 70 | Temp 96.9°F | Wt 157.6 lb

## 2021-01-28 DIAGNOSIS — K922 Gastrointestinal hemorrhage, unspecified: Secondary | ICD-10-CM | POA: Diagnosis not present

## 2021-01-28 DIAGNOSIS — H6120 Impacted cerumen, unspecified ear: Secondary | ICD-10-CM

## 2021-01-28 DIAGNOSIS — G3184 Mild cognitive impairment, so stated: Secondary | ICD-10-CM

## 2021-01-28 DIAGNOSIS — R5381 Other malaise: Secondary | ICD-10-CM

## 2021-01-28 DIAGNOSIS — Z79899 Other long term (current) drug therapy: Secondary | ICD-10-CM | POA: Diagnosis not present

## 2021-01-28 DIAGNOSIS — G20 Parkinson's disease: Secondary | ICD-10-CM | POA: Diagnosis not present

## 2021-01-28 NOTE — Progress Notes (Signed)
   Subjective:    Patient ID: [name], male    DOB: 1951/02/13, 70 y.o.   MRN: [id number]  HPI He is here for a posthospitalization follow-up visit.  He was admitted for evaluation of acute GI bleed in his private process had a colonoscopy done.  That plus his underlying Parkinson's disease tipped him over his in terms of his CNS function and he became disoriented.  He was seen by psychiatry and recommendations were made.  Since then he has stopped his Seroquel and seems to be doing quite nicely off the medication while he was on it he was still somewhat fatigued with fatigue.  He still feels as if he is not in again.  He is no longer having black stools and is now about to start eating a regular diet.  He does think that he might benefit from physical therapy as he did become somewhat deconditioned while in the hospital.  He was also switch back to simvastatin but does have a previous history of statin intolerance.  He is also given isosorbide by Dr. Ellyn Hack.  Does complain of some difficulty with hearing due to cerumen.  Presently he is taking Avapro for his blood pressure.   Review of Systems     Objective:   Physical Exam Alert and in no distress with appropriate affect and oriented x3.  Hand tremor is noted.  The medical record including admission H&P, discharge summary and notes from GI and psychiatry was reviewed.       Assessment & Plan:  Gastrointestinal hemorrhage, unspecified gastrointestinal hemorrhage type - Plan: CBC with Differential/Platelet, Comprehensive metabolic panel  Physical deconditioning - Plan: Ambulatory referral to Physical Therapy  Parkinsonian tremor (HCC)  Mild cognitive impairment  Medication management He will continue on his present medication regimen.  He is to follow-up with GI and neurology concerning further intervention.  Apparently he is being considered for nerve stimulator to help with the Parkinson's tremor. Both ears were lavaged without  difficulty in the.  TMs appear normal.

## 2021-01-29 LAB — CBC WITH DIFFERENTIAL/PLATELET
Basophils Absolute: 0.1 10*3/uL (ref 0.0–0.2)
Basos: 1 %
EOS (ABSOLUTE): 0 10*3/uL (ref 0.0–0.4)
Eos: 1 %
Hematocrit: 29 % — ABNORMAL LOW (ref 37.5–51.0)
Hemoglobin: 9.1 g/dL — ABNORMAL LOW (ref 13.0–17.7)
Immature Grans (Abs): 0 10*3/uL (ref 0.0–0.1)
Immature Granulocytes: 0 %
Lymphocytes Absolute: 1 10*3/uL (ref 0.7–3.1)
Lymphs: 15 %
MCH: 29.1 pg (ref 26.6–33.0)
MCHC: 31.4 g/dL — ABNORMAL LOW (ref 31.5–35.7)
MCV: 93 fL (ref 79–97)
Monocytes Absolute: 0.8 10*3/uL (ref 0.1–0.9)
Monocytes: 12 %
Neutrophils Absolute: 5.1 10*3/uL (ref 1.4–7.0)
Neutrophils: 71 %
Platelets: 281 10*3/uL (ref 150–450)
RBC: 3.13 x10E6/uL — ABNORMAL LOW (ref 4.14–5.80)
RDW: 13.1 % (ref 11.6–15.4)
WBC: 7.1 10*3/uL (ref 3.4–10.8)

## 2021-01-29 LAB — COMPREHENSIVE METABOLIC PANEL
ALT: 13 IU/L (ref 0–44)
AST: 26 IU/L (ref 0–40)
Albumin/Globulin Ratio: 1.7 (ref 1.2–2.2)
Albumin: 4 g/dL (ref 3.7–4.7)
Alkaline Phosphatase: 71 IU/L (ref 44–121)
BUN/Creatinine Ratio: 14 (ref 10–24)
BUN: 17 mg/dL (ref 8–27)
Bilirubin Total: 0.2 mg/dL (ref 0.0–1.2)
CO2: 22 mmol/L (ref 20–29)
Calcium: 8.8 mg/dL (ref 8.6–10.2)
Chloride: 104 mmol/L (ref 96–106)
Creatinine, Ser: 1.18 mg/dL (ref 0.76–1.27)
Globulin, Total: 2.3 g/dL (ref 1.5–4.5)
Glucose: 79 mg/dL (ref 65–99)
Potassium: 4.7 mmol/L (ref 3.5–5.2)
Sodium: 139 mmol/L (ref 134–144)
Total Protein: 6.3 g/dL (ref 6.0–8.5)
eGFR: 66 mL/min/{1.73_m2} (ref >59)

## 2021-01-29 NOTE — Addendum Note (Signed)
Addended by: Denita Lung on: 01/29/2021 08:26 AM   Modules accepted: Orders

## 2021-01-31 ENCOUNTER — Other Ambulatory Visit (HOSPITAL_COMMUNITY): Payer: Self-pay

## 2021-01-31 MED ORDER — GABAPENTIN 300 MG PO CAPS
ORAL_CAPSULE | ORAL | 0 refills | Status: DC
  Filled 2021-01-31: qty 30, 30d supply, fill #0

## 2021-02-02 ENCOUNTER — Other Ambulatory Visit: Payer: Self-pay | Admitting: Neurology

## 2021-02-03 ENCOUNTER — Other Ambulatory Visit (HOSPITAL_COMMUNITY): Payer: Self-pay

## 2021-02-03 ENCOUNTER — Telehealth: Payer: Self-pay | Admitting: Cardiology

## 2021-02-03 MED ORDER — TRIHEXYPHENIDYL HCL 2 MG PO TABS
ORAL_TABLET | ORAL | 2 refills | Status: DC
  Filled 2021-02-03: qty 30, fill #0
  Filled 2021-02-09: qty 30, 30d supply, fill #0
  Filled 2021-03-05: qty 30, 30d supply, fill #1
  Filled 2021-04-03: qty 30, 30d supply, fill #2

## 2021-02-03 NOTE — Telephone Encounter (Signed)
Pt c/o BP issue: STAT if pt c/o blurred vision, one-sided weakness or slurred speech  1. What are your last 5 BP readings? 70/40; 129/71  2. Are you having any other symptoms (ex. Dizziness, headache, blurred vision, passed out)? Headache, feel like passing out, dizzy /light-headed  3. What is your BP issue? Really low BP; faintness; feel like it is caused by the medication isosorbide mononitrate (IMDUR) 30 MG 24 hr tablet

## 2021-02-03 NOTE — Telephone Encounter (Signed)
Spoke to patient's wife she stated husband cannot take Isosorbide 30 mg daily.Stated causing severe headaches and dizziness.Stated he almost passed out this morning.Stated he does not want to try taking 15 mg.He will stop taking and discuss at 7/28 office visit with [place].Advised I will make Dr.Harding aware.

## 2021-02-04 ENCOUNTER — Other Ambulatory Visit (HOSPITAL_COMMUNITY): Payer: Self-pay

## 2021-02-05 ENCOUNTER — Other Ambulatory Visit (HOSPITAL_COMMUNITY): Payer: Self-pay

## 2021-02-08 ENCOUNTER — Other Ambulatory Visit (HOSPITAL_COMMUNITY): Payer: Self-pay

## 2021-02-10 ENCOUNTER — Other Ambulatory Visit (HOSPITAL_COMMUNITY): Payer: Self-pay

## 2021-02-11 DIAGNOSIS — M47816 Spondylosis without myelopathy or radiculopathy, lumbar region: Secondary | ICD-10-CM | POA: Diagnosis not present

## 2021-02-13 ENCOUNTER — Ambulatory Visit: Payer: Self-pay | Admitting: Family Medicine

## 2021-02-14 ENCOUNTER — Encounter: Payer: Self-pay | Admitting: Family Medicine

## 2021-02-14 ENCOUNTER — Other Ambulatory Visit: Payer: Self-pay

## 2021-02-14 ENCOUNTER — Ambulatory Visit (INDEPENDENT_AMBULATORY_CARE_PROVIDER_SITE_OTHER): Payer: Medicare Other | Admitting: Family Medicine

## 2021-02-14 VITALS — BP 128/80 | HR 70 | Temp 98.4°F | Wt 154.2 lb

## 2021-02-14 DIAGNOSIS — D649 Anemia, unspecified: Secondary | ICD-10-CM

## 2021-02-14 LAB — CBC WITH DIFFERENTIAL/PLATELET
Basophils Absolute: 0.1 10*3/uL (ref 0.0–0.2)
Basos: 1 %
EOS (ABSOLUTE): 0 10*3/uL (ref 0.0–0.4)
Eos: 1 %
Hematocrit: 34.7 % — ABNORMAL LOW (ref 37.5–51.0)
Hemoglobin: 11 g/dL — ABNORMAL LOW (ref 13.0–17.7)
Immature Grans (Abs): 0 10*3/uL (ref 0.0–0.1)
Immature Granulocytes: 0 %
Lymphocytes Absolute: 1.2 10*3/uL (ref 0.7–3.1)
Lymphs: 22 %
MCH: 28.3 pg (ref 26.6–33.0)
MCHC: 31.7 g/dL (ref 31.5–35.7)
MCV: 89 fL (ref 79–97)
Monocytes Absolute: 0.9 10*3/uL (ref 0.1–0.9)
Monocytes: 16 %
Neutrophils Absolute: 3.5 10*3/uL (ref 1.4–7.0)
Neutrophils: 60 %
Platelets: 230 10*3/uL (ref 150–450)
RBC: 3.89 x10E6/uL — ABNORMAL LOW (ref 4.14–5.80)
RDW: 12.7 % (ref 11.6–15.4)
WBC: 5.7 10*3/uL (ref 3.4–10.8)

## 2021-02-14 NOTE — Progress Notes (Signed)
   Subjective:    Patient ID: [name], male    DOB: [birth year], 70 y.o.   MRN: [id number]  HPI He is here for follow-up visit for his anemia.  He was admitted to the hospital and found to be anemic.  He did have some GI blood loss.  He has been on iron supplementation and having no difficulty with it.  His bowel habits are normal.   Review of Systems     Objective:   Physical Exam Alert and in no distress otherwise not examined       Assessment & Plan:   Anemia, unspecified type - Plan: CBC with Differential/Platelet

## 2021-02-20 ENCOUNTER — Encounter: Payer: Self-pay | Admitting: Family Medicine

## 2021-02-20 ENCOUNTER — Ambulatory Visit: Payer: Medicare Other | Admitting: Cardiology

## 2021-02-21 ENCOUNTER — Encounter: Payer: Self-pay | Admitting: Family Medicine

## 2021-02-25 DIAGNOSIS — M7741 Metatarsalgia, right foot: Secondary | ICD-10-CM | POA: Diagnosis not present

## 2021-02-28 DIAGNOSIS — M545 Low back pain, unspecified: Secondary | ICD-10-CM | POA: Diagnosis not present

## 2021-03-05 ENCOUNTER — Other Ambulatory Visit: Payer: Self-pay | Admitting: Family Medicine

## 2021-03-05 ENCOUNTER — Other Ambulatory Visit (HOSPITAL_COMMUNITY): Payer: Self-pay

## 2021-03-06 ENCOUNTER — Other Ambulatory Visit: Payer: Self-pay | Admitting: Family Medicine

## 2021-03-06 ENCOUNTER — Other Ambulatory Visit (HOSPITAL_COMMUNITY): Payer: Self-pay

## 2021-03-06 DIAGNOSIS — M47816 Spondylosis without myelopathy or radiculopathy, lumbar region: Secondary | ICD-10-CM | POA: Diagnosis not present

## 2021-03-06 DIAGNOSIS — M5416 Radiculopathy, lumbar region: Secondary | ICD-10-CM | POA: Diagnosis not present

## 2021-03-06 MED ORDER — GABAPENTIN 300 MG PO CAPS
ORAL_CAPSULE | ORAL | 0 refills | Status: DC
  Filled 2021-03-06: qty 30, 30d supply, fill #0

## 2021-03-06 MED ORDER — OXYCODONE HCL 5 MG PO TABS
ORAL_TABLET | ORAL | 0 refills | Status: DC
  Filled 2021-03-06: qty 15, 7d supply, fill #0

## 2021-03-07 DIAGNOSIS — M48061 Spinal stenosis, lumbar region without neurogenic claudication: Secondary | ICD-10-CM | POA: Diagnosis not present

## 2021-03-07 DIAGNOSIS — M545 Low back pain, unspecified: Secondary | ICD-10-CM | POA: Diagnosis not present

## 2021-03-08 ENCOUNTER — Other Ambulatory Visit (HOSPITAL_COMMUNITY): Payer: Self-pay

## 2021-03-10 ENCOUNTER — Encounter: Payer: Self-pay | Admitting: Family Medicine

## 2021-03-10 ENCOUNTER — Other Ambulatory Visit (HOSPITAL_COMMUNITY): Payer: Self-pay

## 2021-03-10 ENCOUNTER — Other Ambulatory Visit: Payer: Self-pay | Admitting: Neurology

## 2021-03-10 ENCOUNTER — Other Ambulatory Visit: Payer: Self-pay | Admitting: Family Medicine

## 2021-03-10 ENCOUNTER — Other Ambulatory Visit: Payer: Self-pay

## 2021-03-10 DIAGNOSIS — K469 Unspecified abdominal hernia without obstruction or gangrene: Secondary | ICD-10-CM

## 2021-03-10 MED ORDER — CARBIDOPA-LEVODOPA 25-100 MG PO TABS
ORAL_TABLET | ORAL | 1 refills | Status: DC
  Filled 2021-03-10: qty 405, 90d supply, fill #0
  Filled 2021-06-08: qty 405, 90d supply, fill #1

## 2021-03-10 MED ORDER — VERAPAMIL HCL ER 120 MG PO TBCR
120.0000 mg | EXTENDED_RELEASE_TABLET | Freq: Every day | ORAL | 1 refills | Status: DC
  Filled 2021-03-10: qty 30, 30d supply, fill #0
  Filled 2021-04-03: qty 30, 30d supply, fill #1

## 2021-03-10 MED ORDER — PANTOPRAZOLE SODIUM 40 MG PO TBEC
40.0000 mg | DELAYED_RELEASE_TABLET | Freq: Two times a day (BID) | ORAL | 0 refills | Status: DC
  Filled 2021-03-10: qty 180, 90d supply, fill #0

## 2021-03-10 MED ORDER — CITALOPRAM HYDROBROMIDE 20 MG PO TABS
20.0000 mg | ORAL_TABLET | Freq: Every day | ORAL | 1 refills | Status: DC
  Filled 2021-03-10: qty 30, 30d supply, fill #0
  Filled 2021-04-03: qty 30, 30d supply, fill #1

## 2021-03-10 MED ORDER — IRBESARTAN 75 MG PO TABS
75.0000 mg | ORAL_TABLET | Freq: Every morning | ORAL | 1 refills | Status: DC
  Filled 2021-03-10: qty 30, 30d supply, fill #0
  Filled 2021-04-03: qty 30, 30d supply, fill #1

## 2021-03-10 NOTE — Telephone Encounter (Signed)
Done KH 

## 2021-03-10 NOTE — Telephone Encounter (Signed)
[facility] is requesting to fill pt celexa. Please advise [facility]

## 2021-03-10 NOTE — Telephone Encounter (Signed)
[name] is requesting to fill pt celexa. Please advise [facility]

## 2021-03-12 DIAGNOSIS — M7741 Metatarsalgia, right foot: Secondary | ICD-10-CM | POA: Diagnosis not present

## 2021-03-17 ENCOUNTER — Other Ambulatory Visit: Payer: Self-pay

## 2021-03-17 ENCOUNTER — Encounter: Payer: Self-pay | Admitting: Surgery

## 2021-03-17 ENCOUNTER — Ambulatory Visit: Payer: Medicare Other | Admitting: Surgery

## 2021-03-17 ENCOUNTER — Telehealth: Payer: Self-pay

## 2021-03-17 VITALS — BP 121/84 | HR 64 | Temp 98.1°F | Ht 71.0 in | Wt 154.4 lb

## 2021-03-17 DIAGNOSIS — K429 Umbilical hernia without obstruction or gangrene: Secondary | ICD-10-CM | POA: Diagnosis not present

## 2021-03-17 DIAGNOSIS — K439 Ventral hernia without obstruction or gangrene: Secondary | ICD-10-CM

## 2021-03-17 NOTE — Patient Instructions (Addendum)
If you have any concerns or questions, please feel free to call our office. See follow up appointment below.   Hernia, Adult     A hernia happens when tissue inside your body pushes out through a weak spot in your belly muscles (abdominal wall). This makes a round lump (bulge). The lump may be: In a scar from surgery that was done in your belly (incisional hernia). Near your belly button (umbilical hernia). In your groin (inguinal hernia). Your groin is the area where your leg meets your lower belly (abdomen). This kind of hernia could also be: In your scrotum, if you are male. In folds of skin around your vagina, if you are male. In your upper thigh (femoral hernia). Inside your belly (hiatal hernia). This happens when your stomach slides above the muscle between your belly and your chest (diaphragm). If your hernia is small and it does not cause pain, you may not need treatment.If your hernia is large or it causes pain, you may need surgery. Follow these instructions at home: Activity Avoid stretching or overusing (straining) the muscles near your hernia. Straining can happen when you: Lift something heavy. Poop (have a bowel movement). Do not lift anything that is heavier than 10 lb (4.5 kg), or the limit that you are told, until your doctor says that it is safe. Use the strength of your legs when you lift something heavy. Do not use only your back muscles to lift. General instructions Do these things if told by your doctor so you do not have trouble pooping (constipation): Drink enough fluid to keep your pee (urine) pale yellow. Eat foods that are high in fiber. These include fresh fruits and vegetables, whole grains, and beans. Limit foods that are high in fat and processed sugars. These include foods that are fried or sweet. Take medicine for trouble pooping. When you cough, try to cough gently. You may try to push your hernia in by very gently pressing on it when you are lying  down. Do not try to force the bulge back in if it will not push in easily. If you are overweight, work with your doctor to lose weight safely. Do not use any products that have nicotine or tobacco in them. These include cigarettes and e-cigarettes. If you need help quitting, ask your doctor. If you will be having surgery (hernia repair), watch your hernia for changes in shape, size, or color. Tell your doctor if you see any changes. Take over-the-counter and prescription medicines only as told by your doctor. Keep all follow-up visits as told by your doctor. Contact a doctor if: You get new pain, swelling, or redness near your hernia. You poop fewer times in a week than normal. You have trouble pooping. You have poop (stool) that is more dry than normal. You have poop that is harder or larger than normal. Get help right away if: You have a fever. You have belly pain that gets worse. You feel sick to your stomach (nauseous). You throw up (vomit). Your hernia cannot be pushed in by very gently pressing on it when you are lying down. Do not try to force the bulge back in if it will not push in easily. Your hernia: Changes in shape or size. Changes color. Feels hard or it hurts when you touch it. These symptoms may represent a serious problem that is an emergency. Do not wait to see if the symptoms will go away. Get medical help right away. Call your local emergency  services (911 in the [place] Summary A hernia happens when tissue inside your body pushes out through a weak spot in the belly muscles. This creates a bulge. If your hernia is small and it does not hurt, you may not need treatment. If your hernia is large or it hurts, you may need surgery. If you will be having surgery, watch your hernia for changes in shape, size, or color. Tell your doctor about any changes. This information is not intended to replace advice given to you by your health care provider. Make sure you discuss any  questions you have with your healthcare provider. Document Revised: 11/03/2018 Document Reviewed: 04/14/2017 Elsevier Patient Education  [place].

## 2021-03-17 NOTE — Telephone Encounter (Signed)
Faxed medical clearance to Dr. Jill Alexanders at [phone].

## 2021-03-17 NOTE — Progress Notes (Signed)
03/17/2021  Reason for Visit:  Ventral hernia  Referring Provider:  Jill Alexanders, MD  History of Present Illness: [name] is a 70 y.o. male presenting for evaluation of a ventral hernia.  The patient reports that he has had it for the last 4-5 years.  Initially had not given him any issues, but more recently he feels that the hernia is larger in size and is harder when it bulges.  Reports also some increased discomfort when the hernia bulges.  Denies any significant pain, and today in the office he's not having any pain.  However, due to the changes, he was referred to [place] for further evaluation.  He denies any nausea, vomiting, constipation, diarrhea.   The patient was recently admitted in June 2022 for diverticular bleed, and had an attempted embolization with IR which was unsuccessful.  During his admission, he had a CTA abdomen/pelvis which demonstrates a fat-containing umbilical hernia and a smaller fat containing supraumbilical hernia as well.  I have seen all the images personally.  Since his discharge, he has not had any further bleeding.  During the admission, he had procedures, different anesthesia settings, and also suffered from delirium in the hospital.  The patient also reports that he suffers from back pain issues and he will be going to back injections in September and possible nerve ablation if the injections work.  Past Medical History: Past Medical History:  Diagnosis Date   Allergy    Arthritis    BPH (benign prostatic hyperplasia)    Complication of anesthesia    Pt. stated he had a reaction that ended in him requiring urinary cath placement   Diverticulosis    GERD (gastroesophageal reflux disease)    esophageal spasms   Glaucoma    Gout    Head injury, closed, with concussion    Hepatitis C    chronic - Has been treated with Harvoni   HLD (hyperlipidemia)    statin intolerant (Crestor & Simvastatin) - Taking Livalo 32m / week   Hypertension     Parkinson's disease ([facility]    Plantar fasciitis    right   PVD (peripheral vascular disease) ([facility]    With no claudication; only mild abdominal aortic atherosclerosis noted on ultrasound.   Thoracic ascending aortic aneurysm (HCC)    4.2 cm ascending TAA 09/2016 CT, 1 yr f/u rec   Ulcer      Past Surgical History: Past Surgical History:  Procedure Laterality Date   BUNIONECTOMY WITH WEIL OSTEOTOMY Right 11/02/2019   Procedure: Right Foot Lapidus, Modified McBride Bunionectomy,  Hallux Akin Osteotomy;  Surgeon: HWylene Simmer MD;  Location: [facility]  Service: Orthopedics;  Laterality: Right;   CARDIAC CATHETERIZATION  2005   30% Cx. Dr. GMelvern Banker  cataract surgery Left 11/05/2014   COLONOSCOPY     COLONOSCOPY N/A 01/09/2021   Procedure: COLONOSCOPY;  Surgeon: KRonnette Juniper MD;  Location: [facility];  Service: Gastroenterology;  Laterality: N/A;   ESOPHAGOGASTRODUODENOSCOPY N/A 05/02/2015   Procedure: ESOPHAGOGASTRODUODENOSCOPY (EGD);  Surgeon: RRonald Lobo MD;  [facility];  Service: Endoscopy;  Laterality: N/A;   ESOPHAGOGASTRODUODENOSCOPY  05/02/2015   no source of pt chest pain endoscopically evident. small hiatal hernia.   ESOPHAGOGASTRODUODENOSCOPY (EGD) WITH PROPOFOL N/A 01/02/2021   Procedure: ESOPHAGOGASTRODUODENOSCOPY (EGD) WITH PROPOFOL;  Surgeon: MClarene Essex MD;  Location: [facility];  Service: Endoscopy;  Laterality: N/A;   EYE SURGERY     HARDWARE REMOVAL Right 11/02/2019   Procedure: Second Metatarsal Removal  of Deep Implant and Rotational Osteotomy;  Surgeon: Wylene Simmer, MD;  Location: [place];  Service: Orthopedics;  Laterality: Right;   IR ANGIOGRAM SELECTIVE EACH ADDITIONAL VESSEL  01/04/2021   IR ANGIOGRAM SELECTIVE EACH ADDITIONAL VESSEL  01/04/2021   IR ANGIOGRAM VISCERAL SELECTIVE  01/04/2021   IR US GUIDE VASC ACCESS RIGHT  01/04/2021   MEMBRANE PEEL Left 03/14/2014   Procedure: MEMBRANE PEEL; ENDOLASER;  Surgeon: Hurman Horn, MD;  Location: [facility];  Service: Ophthalmology;  Laterality: Left;   NM MYOVIEW LTD  2011   Neg Ischemia or infarct.   NM MYOVIEW LTD  10/2015   LOW RISK. Small, fixed basal lateral defect - likely diaphragmatic attenuation. EF 69%   PARS PLANA VITRECTOMY Left 03/14/2014   Procedure: PARS PLANA VITRECTOMY WITH 25 GAUGE;  Surgeon: Hurman Horn, MD;  Location: [facility];  Service: Ophthalmology;  Laterality: Left;   TONSILLECTOMY     TRANSTHORACIC ECHOCARDIOGRAM  2013   Normal EF. No significant Valve Disease   UPPER GASTROINTESTINAL ENDOSCOPY     WEIL OSTEOTOMY Right 09/01/2017   Procedure: RIGHT GREAT TOE CHEVRON AND WEIL OSTEOTOMY 2ND METATARSAL;  Surgeon: Newt Minion, MD;  Location: [facility];  Service: Orthopedics;  Laterality: Right;    Home Medications: Prior to Admission medications   Medication Sig Start Date End Date Taking? Authorizing Provider  acetaminophen (TYLENOL) 500 MG tablet Take 500 mg by mouth every 6 (six) hours as needed for mild pain, fever or headache.   Yes [provider]  alfuzosin (UROXATRAL) 10 MG 24 hr tablet TAKE 1 TABLET BY MOUTH ONCE A DAY 11/28/20 11/28/21 Yes MacDiarmid, Nicki Reaper, MD  bismuth subsalicylate (PEPTO BISMOL) 262 MG chewable tablet Chew 524 mg by mouth as needed for diarrhea or loose stools.   Yes [provider]  carbidopa-levodopa (SINEMET IR) 25-100 MG tablet TAKE 1 AND 1/2 TABLETS BY MOUTH THREE TIMES DAILY 03/10/21 03/10/22 Yes Garvin Fila, MD  citalopram (CELEXA) 20 MG tablet Take 1 tablet (20 mg total) by mouth daily. 03/10/21  Yes Denita Lung, MD  docusate sodium (COLACE) 100 MG capsule Take 200 mg by mouth daily.   Yes [provider]  gabapentin (NEURONTIN) 300 MG capsule Take 1 capsule by mouth daily at bedtime 03/06/21  Yes   irbesartan (AVAPRO) 75 MG tablet Take 1 tablet (75 mg total) by mouth every morning. 03/10/21  Yes Denita Lung, MD  L-Methylfolate-B12-B6-B2 (CEREFOLIN) 12-25-48-5 MG TABS Take 1 tablet  by mouth daily. 10/01/20  Yes Garvin Fila, MD  Multiple Vitamin (MULTIVITAMIN WITH MINERALS) TABS tablet Take 1 tablet by mouth daily.   Yes [provider]  nitroGLYCERIN (NITROSTAT) 0.4 MG SL tablet Place 1 tablet (0.4 mg total) under the tongue every 5 (five) minutes as needed for chest pain. 11/28/20  Yes [name], MD  oxyCODONE (OXY IR/ROXICODONE) 5 MG immediate release tablet Take 1 tablet by mouth twice daily as needed 03/06/21  Yes   pantoprazole (PROTONIX) 40 MG tablet Take 1 tablet (40 mg total) by mouth 2 (two) times daily. 03/10/21  Yes Denita Lung, MD  simvastatin (ZOCOR) 40 MG tablet TAKE 1 TABLET BY MOUTH AT BEDTIME 08/22/20 08/22/21 Yes Denita Lung, MD  timolol (TIMOPTIC) 0.5 % ophthalmic solution Place 1 drop into both eyes daily.   Yes [provider]  trihexyphenidyl (ARTANE) 2 MG tablet Take 1/2 tablet by mouth twice daily with a meal or as directed. 02/03/21  02/03/22 Yes Garvin Fila, MD  verapamil (CALAN-SR) 120 MG CR tablet Take 1 tablet (120 mg total) by mouth daily. 03/10/21  Yes Denita Lung, MD  VITAMIN D PO Take 1 capsule by mouth daily.   Yes [provider]    Allergies: No Known Allergies  Social History:  reports that he quit smoking about 32 years ago. His smoking use included cigars. He has never used smokeless tobacco. He reports current alcohol use of about 3.0 standard drinks per week. He reports that he does not use drugs.   Family History: Family History  Problem Relation Age of Onset   Heart disease Mother    Cancer Paternal Grandfather    Cancer Sister     Review of Systems: Review of Systems  Constitutional:  Negative for chills and fever.  HENT:  Negative for hearing loss.   Respiratory:  Negative for shortness of breath.   Cardiovascular:  Negative for chest pain.  Gastrointestinal:  Positive for abdominal pain. Negative for blood in stool, constipation, diarrhea, nausea and vomiting.   Genitourinary:  Negative for dysuria.  Musculoskeletal:  Positive for back pain.  Skin:  Negative for rash.  Neurological:  Negative for dizziness.  Psychiatric/Behavioral:  Negative for depression.    Physical Exam BP 121/84   Pulse 64   Temp 98.1 F (36.7 C) (Oral)   Ht '5\' 11"'  (1.803 m)   Wt 154 lb 6.4 oz (70 kg)   SpO2 93%   BMI 21.53 kg/m  CONSTITUTIONAL: No acute distress, well nourished. HEENT:  Normocephalic, atraumatic, extraocular motion intact. NECK: Trachea is midline, and there is no jugular venous distension.  RESPIRATORY:  Lungs are clear, and breath sounds are equal bilaterally. Normal respiratory effort without pathologic use of accessory muscles. CARDIOVASCULAR: Heart is regular without murmurs, gallops, or rubs. GI: The abdomen is soft, non-distended, non-tender to palpation.  The patient has diastasis recti of 3 cm max of the upper abdominal region, and within the diastasis he has a small umbilical hernia and a small supraumbilical hernia.  The umbilical hernia is reducible, and the supraumbilical hernia is incarcerated.  No changes of strangulation, no skin erythema or induration, and non-tender with manipulation.  MUSCULOSKELETAL:  Normal muscle strength and tone in all four extremities.  No peripheral edema or cyanosis. SKIN: Skin turgor is normal. There are no pathologic skin lesions.  NEUROLOGIC:  Motor and sensation is grossly normal.  Cranial nerves are grossly intact. PSYCH:  Alert and oriented to person, place and time. Affect is normal.  Laboratory Analysis: Labs from 01/28/21: Na 139, K 4.7, Cl 104, CO2 22, BUN 17, Cr 1.18.  Total bili 0.2, AST 26, ALT 13, Alk Phos 71.   Labs from 02/14/21: WBC 5.7, Hgb 11, Hct 34.7, Plt 230.  Imaging: CTA abdomen/pelvis on 01/04/21: FINDINGS: VASCULAR   Aorta: Scattered atherosclerotic vascular calcifications throughout the abdominal aorta. No evidence of aneurysm or dissection.   Celiac: Patent without evidence  of aneurysm, dissection, vasculitis or significant stenosis.   SMA: Patent without evidence of aneurysm, dissection, vasculitis or significant stenosis.   Renals: Mild stenosis at the origin of the right renal artery secondary to fibrofatty atherosclerotic plaque. Left renal artery is widely patent. No accessory renal arteries.   IMA: Focal moderate to high-grade stenosis at the origin. The remainder of the artery is patent.   Inflow: The iliac arteries are highly tortuous. Scattered atherosclerotic calcifications without significant stenosis, occlusion, dissection or aneurysm.   Proximal Outflow: Bilateral  common femoral and visualized portions of the superficial and profunda femoral arteries are patent without evidence of aneurysm, dissection, vasculitis or significant stenosis.   Veins: No focal venous abnormality.   Review of the MIP images confirms the above findings.   NON-VASCULAR   Lower chest: No acute abnormality. Greater than 2 year stability of small pulmonary nodules (images 17 and 28 of series 10) consistent with benign granuloma. Trace dependent atelectasis. The visualized heart is normal in size. No pericardial effusion. No significant abnormality of the visualized distal thoracic esophagus.   Hepatobiliary: No focal liver abnormality is seen. No gallstones, gallbladder wall thickening, or biliary dilatation.   Pancreas: Unremarkable. No pancreatic ductal dilatation or surrounding inflammatory changes.   Spleen: Normal in size without focal abnormality.   Adrenals/Urinary Tract: The adrenal glands are normal. No hydronephrosis, nephrolithiasis or enhancing renal mass. Large simple cysts exophytic from the upper pole of the right kidney in the anterior interpolar left kidney. The ureters and bladder are unremarkable.   Stomach/Bowel: Active extravasation of arterial phase contrast into a diverticulum of the ascending colon just proximal to the hepatic flexure (images  80-94) with evidence of blooming and expansion of the contrast blush on the delayed phase images. Findings are consistent with acute diverticular hemorrhage. Stomach and proximal small bowel are within normal limits. No focal bowel wall thickening or evidence of bowel obstruction.   Lymphatic: No suspicious lymphadenopathy.   Reproductive: Prostatomegaly.   Other: Fat containing umbilical hernia.  No evidence of ascites.   Musculoskeletal: No acute fracture or aggressive appearing lytic or blastic osseous lesion. Mild grade 1 anterolisthesis of L4 on L5 without spondylo lysis. Associated lower lumbar degenerative disc disease and facet arthropathy.   IMPRESSION: 1. Positive for active diverticular bleed in the ascending colon just proximal to the hepatic flexure. 2. Scattered atherosclerotic vascular calcifications without aneurysm, dissection or occlusion. Aortic Atherosclerosis (ICD10-I70.0). 3. Progressive lower lumbar degenerative disc disease and facet arthropathy now with mild grade 1 anterolisthesis of L4 on L5. 4. Additional ancillary findings as above without significant interval change compared to prior imaging.  Assessment and Plan: This is a 70 y.o. male with an umbilical and supraumbilical hernia in the setting of diastasis recti.  --Discussed with the patient the findings on exam today and compared to his CT scan.  Although CT report only mentions an umbilical hernia, he also has a small supraumbilical hernia, also containing fat.  Both do not show any evidence of strangulation, although the supraumbilical hernia is not reducible.  Discussed with him and his wife the natural progression of hernias and how they can increase in size and symptoms with time.  He's having more symptoms now and would like to address the issue.  Discussed with them that unfortunately there is no conservative measure to repair the hernia.   --Discussed with them the role for a minimally invasive robotic  approach to ventral hernia repair.  Both defects would be repaired at the same time and in doing so, would plicate the rectus muscles to help with the diastasis.  Discussed the use of mesh to reinforce the repair.  Reviewed with him the surgery at length, including the incisions, that this is outpatient surgery under general anesthesia, risks of bleeding, infection, and injury to surrounding structures, post-op recovery and restrictions.  The patient reports that he has scheduled a back injection in mid September which would be the first of three injection in preparation for possible nerve ablation to help with his back  pain.  We have agreed that this should take priority as his back pain is worse than his abdominal pain.  However, if his hernia gets any worse or he starts having worsening symptoms, would need to address this sooner. --For now will tentatively schedule him to come back for follow up in November 2022 to see how everything has gone with his back.  If they finish sooner, then we can move the appointment earlier, or push further out if needed.  Face-to-face time spent with the patient and care providers was 80 minutes, with more than 50% of the time spent counseling, educating, and coordinating care of the patient.     Melvyn Neth, [facility]

## 2021-03-26 ENCOUNTER — Other Ambulatory Visit (HOSPITAL_COMMUNITY): Payer: Self-pay

## 2021-03-26 MED ORDER — OXYCODONE HCL 5 MG PO TABS
ORAL_TABLET | ORAL | 0 refills | Status: DC
  Filled 2021-03-26: qty 15, 7d supply, fill #0

## 2021-04-03 ENCOUNTER — Other Ambulatory Visit (HOSPITAL_COMMUNITY): Payer: Self-pay

## 2021-04-03 MED FILL — Simvastatin Tab 40 MG: ORAL | 90 days supply | Qty: 90 | Fill #0 | Status: AC

## 2021-04-04 ENCOUNTER — Other Ambulatory Visit (HOSPITAL_COMMUNITY): Payer: Self-pay

## 2021-04-07 ENCOUNTER — Encounter: Payer: Self-pay | Admitting: Adult Health

## 2021-04-07 ENCOUNTER — Ambulatory Visit: Payer: Medicare Other | Admitting: Cardiology

## 2021-04-07 ENCOUNTER — Encounter: Payer: Self-pay | Admitting: Cardiology

## 2021-04-07 ENCOUNTER — Other Ambulatory Visit (HOSPITAL_COMMUNITY): Payer: Self-pay

## 2021-04-07 ENCOUNTER — Other Ambulatory Visit: Payer: Self-pay

## 2021-04-07 ENCOUNTER — Ambulatory Visit: Payer: Medicare Other | Admitting: Adult Health

## 2021-04-07 VITALS — BP 116/78 | HR 69 | Ht 70.0 in | Wt 156.0 lb

## 2021-04-07 VITALS — BP 138/98 | HR 61 | Ht 70.5 in | Wt 157.0 lb

## 2021-04-07 DIAGNOSIS — G20 Parkinson's disease: Secondary | ICD-10-CM | POA: Diagnosis not present

## 2021-04-07 DIAGNOSIS — G3184 Mild cognitive impairment, so stated: Secondary | ICD-10-CM | POA: Diagnosis not present

## 2021-04-07 DIAGNOSIS — E785 Hyperlipidemia, unspecified: Secondary | ICD-10-CM

## 2021-04-07 DIAGNOSIS — R079 Chest pain, unspecified: Secondary | ICD-10-CM

## 2021-04-07 DIAGNOSIS — Z8249 Family history of ischemic heart disease and other diseases of the circulatory system: Secondary | ICD-10-CM

## 2021-04-07 DIAGNOSIS — I700 Atherosclerosis of aorta: Secondary | ICD-10-CM

## 2021-04-07 DIAGNOSIS — I10 Essential (primary) hypertension: Secondary | ICD-10-CM | POA: Diagnosis not present

## 2021-04-07 DIAGNOSIS — G20C Parkinsonism, unspecified: Secondary | ICD-10-CM

## 2021-04-07 MED ORDER — TRIHEXYPHENIDYL HCL 2 MG PO TABS
1.0000 mg | ORAL_TABLET | Freq: Two times a day (BID) | ORAL | 5 refills | Status: DC
  Filled 2021-04-07 – 2021-05-04 (×2): qty 30, 30d supply, fill #0
  Filled 2021-06-08: qty 30, 30d supply, fill #1
  Filled 2021-06-15 – 2021-07-05 (×2): qty 30, 30d supply, fill #2
  Filled 2021-07-23: qty 30, 30d supply, fill #3
  Filled 2021-09-07: qty 30, 30d supply, fill #4

## 2021-04-07 NOTE — Progress Notes (Signed)
Primary Care Provider: Denita Lung, MD Cardiologist: Glenetta Hew, MD Electrophysiologist: None  Clinic Note: Chief Complaint  Patient presents with   Follow-up    22-month  Hospitalization Follow-up    Recent admission in June for GI bleed    ===================================  ASSESSMENT/PLAN   Problem List Items Addressed This Visit       Cardiology Problems   Hyperlipidemia with target LDL less than 100 (Chronic)    On simvastatin, his most recent lipids look incredible back in June.  I wonder if this was probably related to his chronic illness.  Continue to follow-up with PCP.      Abdominal aortic atherosclerosis (HCC) (Chronic)    Continue risk factor modification, reassess AAA Doppler next year.  .  Blood pressure, lipid and glycemic control.      Moderate essential hypertension - Primary (Chronic)    Borderline pressures today.  He says that usually better than this at home.  He is on verapamil and irbesartan. No room to titrate the verapamil any further.  If pressures remain elevated, would potentially increase ARB dose. ->  Could easily be rate increased up to 150 mg.      Relevant Orders   EKG 12-Lead (Completed)     Other   Chest pain at rest    When I last saw him, I was concerned about the chest discomfort, but now it appears it was probably mostly epigastric in the reason for his bleed.  We will plan to do a coronary CTA, but based on the fact that he is not having any more symptoms, and tolerated profound anemia without chest pain, I think we can forego ischemic evaluation with either Myoview or coronary CTA.  Continued to treat for cardiac risk factors : blood pressure, lipids and chemistry.      Family history of heart disease in male family member before age 71(Chronic)    We discussed doing a coronary CTA.  Lots of events happened in the interim, and that never happened.  Thankfully she he was totally asymptomatic with a GI bleed  as far as cardiac symptoms.  For now we will simply treat risk factors, but we can discuss coronary calcium score at follow-up visit.  No need to check now because it would not change therapy.       ===================================  HPI:    [name] a 70y.o. male with a PMH below who presents today for 462-monthospital follow-up.  Jesus SUPPESas last seen on Dec 24, 2020.  He was noted some resting chest pain, plan was to check for ischemic CAD with a Cardiac CTA -> he had a combination of left shoulder tightness and fluttering in the epigastric area.  Did not feel like skipped beats.  Often associated with eating.. Marland KitchenHe also notes some left-sided facial numbness, with arm numbness OwRoxy Mannsheck carotid Dopplers.  Was put back on simvastatin for some reason.  [facility].  BP stable on irbesartan and verapamil.  For chest pain we added Imdur 30 mg. -> Unfortunately, the Cardiac CTA was never done because of his hospitalization.  Recent Hospitalizations:  6/9-23/2022: Admitted for GI bleed-melena.  Found to be anemic.  Normal EGD.  Colonoscopy only showed diverticulosis but no bleeding.  CTA of the abdomen pelvis localized bleeding in the colon (hepatic flexure)-unsuccessful ablation by IR.  Total 4 unit transfusion 01/09/2021-transferred  to stepdown unit for hypotension. Complicated by hospital delirium requiring short-term Precedex With stabilization of hemoglobin level, discharged home-hemoglobin 10.5.  Reviewed  CV studies:    The following studies were reviewed today: (if available, images/films reviewed: From Epic Chart or Care Everywhere) ECHO 01/07/2021: Normal EF 60 to 65%.  No R WMA.  GRII DD-moderately dilated LA..  Mildly dilated RV but normal function.  Normal RAP/CVP.  Trivial AI with mild to moderate sclerosis-no stenosis  Interval History:   [name] returns today overall doing pretty well  from a cardiac standpoint.  He just seems to be tired and worn out still from his recent hospitalization.  He has not really got back to doing much in the way of any exercise.  He was really worn out initially, and got out of the habit of doing his exercise.  He is open to try to get back in doing these because he been doing about a half an hour of cycling on a stationary bike as well as other machines at the [facility] leading up to his hospitalization.   I think he has been very anxious since his discharge with concerns of possibly having to go back to have other procedures done.  Thankfully, he has not had any chest pain or pressure with rest or exertion since discharge.  A lot of the fluttering of his abdomen was probably related to the GI bleed.  Now that is stabilized he is not feeling into the abdominal discomfort at all.  No palpitations or irregular heartbeats.  In fact, he did not have any chest pain or pressure despite having hypotension and frank anemia in the hospital.  He does note that initially when he first got to the hospital he was little short of breath with walking in doing things, but now he just seems to be deconditioned.  CV Review of Symptoms (Summary) Cardiovascular ROS: positive for - dyspnea on exertion and off and on skipped beats but nothing prolonged negative for - chest pain, edema, orthopnea, paroxysmal nocturnal dyspnea, rapid heart rate, shortness of breath, or lightheadedness, dizziness or wooziness, syncope/near syncope or TIA/amaurosis fugax, claudication  REVIEWED OF SYSTEMS   Review of Systems  Constitutional:  Positive for malaise/fatigue (not doing much since d/c from hospital - Energy level not back to baseline, but had been doing well prior to GIB). Negative for weight loss.  HENT:  Negative for congestion and nosebleeds.   Respiratory:  Negative for cough and shortness of breath.   Cardiovascular:  Negative for chest pain.  Gastrointestinal:  Negative for blood  in stool and melena (none since just after d/c from hospital).  Neurological:  Positive for dizziness (since d/c getting better) and weakness (global). Negative for focal weakness.  Psychiatric/Behavioral:  Negative for depression, memory loss and suicidal ideas. The patient is nervous/anxious. The patient does not have insomnia.    I have reviewed and (if needed) personally updated the patient's problem list, medications, allergies, past medical and surgical history, social and family history.   PAST MEDICAL HISTORY   Past Medical History:  Diagnosis Date   Allergy    Arthritis    BPH (benign prostatic hyperplasia)    Complication of anesthesia    Pt. stated he had a reaction that ended in him requiring urinary cath placement   Diverticulosis    GERD (gastroesophageal reflux disease)    esophageal spasms   Glaucoma    Gout    Head injury, closed, with concussion  Hepatitis C    chronic - Has been treated with Harvoni   HLD (hyperlipidemia)    statin intolerant (Crestor & Simvastatin) - Taking Livalo '1mg'$  / week   Hypertension    Parkinson's disease ([place])    Plantar fasciitis    right   PVD (peripheral vascular disease) ([place])    With no claudication; only mild abdominal aortic atherosclerosis noted on ultrasound.   Thoracic ascending aortic aneurysm    4.2 cm ascending TAA 09/2016 CT, 1 yr f/u rec   Ulcer     PAST SURGICAL HISTORY   Past Surgical History:  Procedure Laterality Date   BUNIONECTOMY WITH WEIL OSTEOTOMY Right 11/02/2019   Procedure: Right Foot Lapidus, Modified McBride Bunionectomy,  Hallux Akin Osteotomy;  Surgeon: Wylene Simmer, MD;  Location: [place];  Service: Orthopedics;  Laterality: Right;   CARDIAC CATHETERIZATION  2005   30% Cx. Dr. Melvern Banker   cataract surgery Left 11/05/2014   COLONOSCOPY     COLONOSCOPY N/A 01/09/2021   Procedure: COLONOSCOPY;  Surgeon: Ronnette Juniper, MD;  Location: [facility];  Service: Gastroenterology;   Laterality: N/A;   ESOPHAGOGASTRODUODENOSCOPY N/A 05/02/2015   Procedure: ESOPHAGOGASTRODUODENOSCOPY (EGD);  Surgeon: Ronald Lobo, MD;  Location: [facility];  Service: Endoscopy;  Laterality: N/A;   ESOPHAGOGASTRODUODENOSCOPY  05/02/2015   no source of pt chest pain endoscopically evident. small hiatal hernia.   ESOPHAGOGASTRODUODENOSCOPY (EGD) WITH PROPOFOL N/A 01/02/2021   Procedure: ESOPHAGOGASTRODUODENOSCOPY (EGD) WITH PROPOFOL;  Surgeon: Clarene Essex, MD;  Location: [facility];  Service: Endoscopy;  Laterality: N/A;   EYE SURGERY     HARDWARE REMOVAL Right 11/02/2019   Procedure: Second Metatarsal Removal of Deep Implant and Rotational Osteotomy;  Surgeon: Wylene Simmer, MD;  Location: [place];  Service: Orthopedics;  Laterality: Right;   IR ANGIOGRAM SELECTIVE EACH ADDITIONAL VESSEL  01/04/2021   IR ANGIOGRAM SELECTIVE EACH ADDITIONAL VESSEL  01/04/2021   IR ANGIOGRAM VISCERAL SELECTIVE  01/04/2021   IR US GUIDE VASC ACCESS RIGHT  01/04/2021   MEMBRANE PEEL Left 03/14/2014   Procedure: MEMBRANE PEEL; ENDOLASER;  Surgeon: Hurman Horn, MD;  Location: [place];  Service: Ophthalmology;  Laterality: Left;   NM MYOVIEW LTD  2011   Neg Ischemia or infarct.   NM MYOVIEW LTD  10/2015   LOW RISK. Small, fixed basal lateral defect - likely diaphragmatic attenuation. EF 69%   PARS PLANA VITRECTOMY Left 03/14/2014   Procedure: PARS PLANA VITRECTOMY WITH 25 GAUGE;  Surgeon: Hurman Horn, MD;  Location: [facility];  Service: Ophthalmology;  Laterality: Left;   TONSILLECTOMY     TRANSTHORACIC ECHOCARDIOGRAM  01/07/2021   Normal EF 60 to 65%.  No R WMA.  GRII DD-moderately dilated LA..  Mildly dilated RV but normal function.  Normal RAP/CVP.  Trivial AI with mild to moderate sclerosis-no stenosis   UPPER GASTROINTESTINAL ENDOSCOPY     WEIL OSTEOTOMY Right 09/01/2017   Procedure: RIGHT GREAT TOE CHEVRON AND WEIL OSTEOTOMY 2ND METATARSAL;  Surgeon: Newt Minion, MD;  Location: [place];  Service: Orthopedics;  Laterality: Right;    Immunization History  Administered Date(s) Administered   DTaP 09/10/1997, 08/11/2007   Fluad Quad(high Dose 65+) 05/17/2019   Influenza Split 05/09/2012, 06/09/2015, 07/11/2015   Influenza Whole 07/10/2004, 05/25/2006   Influenza, High Dose Seasonal PF 05/05/2017, 04/15/2018   Influenza,inj,Quad PF,6+ Mos 04/28/2013, 04/26/2014, 05/31/2016   Influenza-Unspecified 05/25/2016   Moderna SARS-COV2 Booster Vaccination 11/07/2020   Moderna Sars-Covid-2 Vaccination 08/20/2019, 09/17/2019, 05/22/2020  Pneumococcal Conjugate-13 09/07/2010   Pneumococcal Polysaccharide-23 07/16/2017   Tdap 09/05/2011   Zoster Recombinat (Shingrix) 01/17/2020, 03/20/2020    MEDICATIONS/ALLERGIES   Current Meds  Medication Sig   acetaminophen (TYLENOL) 500 MG tablet Take 500 mg by mouth every 6 (six) hours as needed for mild pain, fever or headache.   alfuzosin (UROXATRAL) 10 MG 24 hr tablet TAKE 1 TABLET BY MOUTH ONCE A DAY   bismuth subsalicylate (PEPTO BISMOL) 262 MG chewable tablet Chew 524 mg by mouth as needed for diarrhea or loose stools.   carbidopa-levodopa (SINEMET IR) 25-100 MG tablet TAKE 1 AND 1/2 TABLETS BY MOUTH THREE TIMES DAILY   citalopram (CELEXA) 20 MG tablet Take 1 tablet (20 mg total) by mouth daily.   docusate sodium (COLACE) 100 MG capsule Take 200 mg by mouth daily.   gabapentin (NEURONTIN) 300 MG capsule Take 1 capsule by mouth daily at bedtime (Patient taking differently: as needed.)   irbesartan (AVAPRO) 75 MG tablet Take 1 tablet (75 mg total) by mouth every morning.   L-Methylfolate-B12-B6-B2 (CEREFOLIN) 12-25-48-5 MG TABS Take 1 tablet by mouth daily.   Multiple Vitamin (MULTIVITAMIN WITH MINERALS) TABS tablet Take 1 tablet by mouth daily.   nitroGLYCERIN (NITROSTAT) 0.4 MG SL tablet Place 1 tablet (0.4 mg total) under the tongue every 5 (five) minutes as needed for chest pain.   oxyCODONE (OXY IR/ROXICODONE) 5 MG immediate  release tablet Take 1 tablet by mouth twice a day as needed.   pantoprazole (PROTONIX) 40 MG tablet Take 1 tablet (40 mg total) by mouth 2 (two) times daily.   simvastatin (ZOCOR) 40 MG tablet TAKE 1 TABLET BY MOUTH AT BEDTIME   timolol (TIMOPTIC) 0.5 % ophthalmic solution Place 1 drop into both eyes daily.   verapamil (CALAN-SR) 120 MG CR tablet Take 1 tablet (120 mg total) by mouth daily.   VITAMIN D PO Take 1 capsule by mouth daily.   [DISCONTINUED] trihexyphenidyl (ARTANE) 2 MG tablet Take 1/2 tablet by mouth twice daily with a meal or as directed.    No Known Allergies  SOCIAL HISTORY/FAMILY HISTORY   Reviewed in Epic:  Pertinent findings:  Social History   Tobacco Use   Smoking status: Former    Types: Cigars    Quit date: 07/27/1988    Years since quitting: 32.7   Smokeless tobacco: Never  Vaping Use   Vaping Use: Never used  Substance Use Topics   Alcohol use: Yes    Alcohol/week: 3.0 standard drinks    Types: 1 Glasses of wine, 1 Cans of beer, 1 Shots of liquor per week    Comment: occasional   Drug use: No   Social History   Social History Narrative   Lives with wife    Right handed   Drinks 1-2 cups caffeine daily    OBJCTIVE -PE, EKG, labs   Wt Readings from Last 3 Encounters:  04/07/21 157 lb (71.2 kg)  04/07/21 156 lb (70.8 kg)  03/17/21 154 lb 6.4 oz (70 kg)    Physical Exam: BP (!) 138/98 (BP Location: Left Arm, Patient Position: Sitting, Cuff Size: Normal)   Pulse 61   Ht 5' 10.5" (1.791 m)   Wt 157 lb (71.2 kg)   BMI 22.21 kg/m  Physical Exam Constitutional:      General: He is not in acute distress.    Appearance: Normal appearance. He is normal weight. He is not ill-appearing or toxic-appearing.     Comments: Relatively healthy-appearing.  Well-groomed.  HENT:     Head: Normocephalic and atraumatic.  Neck:     Comments: Somewhat stiff Cardiovascular:     Rate and Rhythm: Normal rate and regular rhythm.     Pulses: Normal pulses.      Heart sounds: Murmur (1-2/6 SEM at RUSB.) heard.    No friction rub. No gallop.  Pulmonary:     Effort: Pulmonary effort is normal. No respiratory distress.     Breath sounds: Normal breath sounds. No wheezing, rhonchi or rales.  Chest:     Chest wall: No tenderness.  Musculoskeletal:        General: Swelling (Trivial) present. Normal range of motion.     Cervical back: Normal range of motion.  Skin:    General: Skin is warm and dry.  Neurological:     General: No focal deficit present.     Mental Status: He is alert and oriented to person, place, and time.     Gait: Gait abnormal (Stiff/shuffling gait).     Comments: Mild pill-rolling tremor L>R  Psychiatric:        Mood and Affect: Mood normal.        Behavior: Behavior normal.        Thought Content: Thought content normal.        Judgment: Judgment normal.     Comments: A little anxious    Adult ECG Report  Rate: 61 ;  Rhythm: normal sinus rhythm; CRO anterior &Inferior MI, age-indeterminate.  Narrative Interpretation: Stable EKG, but findings consistent of prior MI not consistent with echo.  Recent Labs: Reviewed Lab Results  Component Value Date   CHOL 76 01/07/2021   HDL 35 (L) 01/07/2021   LDLCALC 34 01/07/2021   TRIG 36 01/07/2021   CHOLHDL 2.2 01/07/2021   Lab Results  Component Value Date   CREATININE 1.18 01/28/2021   BUN 17 01/28/2021   NA 139 01/28/2021   K 4.7 01/28/2021   CL 104 01/28/2021   CO2 22 01/28/2021   CBC Latest Ref Rng & Units 02/14/2021 01/28/2021 01/16/2021  WBC 3.4 - 10.8 x10E3/uL 5.7 7.1 8.5  Hemoglobin 13.0 - 17.7 g/dL 11.0(L) 9.1(L) 10.8(L)  Hematocrit 37.5 - 51.0 % 34.7(L) 29.0(L) 33.2(L)  Platelets 150 - 450 x10E3/uL 230 281 288    No results found for: HGBA1C Lab Results  Component Value Date   TSH 3.131 01/11/2021    ==================================================  COVID-19 Education: The signs and symptoms of COVID-19 were discussed with the patient and how to seek  care for testing (follow up with PCP or arrange E-visit).    I spent a total of 19 minutes with the patient spent in direct patient consultation.  Additional time spent with chart review  / charting (studies, outside notes, etc): 16 min Total Time: 35 min  Current medicines are reviewed at length with the patient today.  (+/- concerns) none  This visit occurred during the SARS-CoV-2 public health emergency.  Safety protocols were in place, including screening questions prior to the visit, additional usage of staff PPE, and extensive cleaning of exam room while observing appropriate contact time as indicated for disinfecting solutions.  Notice: This dictation was prepared with Dragon dictation along with smaller phrase technology. Any transcriptional errors that result from this process are unintentional and may not be corrected upon review.  Patient Instructions / Medication Changes & Studies & Tests Ordered   Patient Instructions  Medication Instructions:  No changes *If you need a refill on your cardiac medications  before your next appointment, please call your pharmacy*   Lab Work: None ordered If you have labs (blood work) drawn today and your tests are completely normal, you will receive your results only by: [place] (if you have MyChart) OR A paper copy in the mail If you have any lab test that is abnormal or we need to change your treatment, we will call you to review the results.   Testing/Procedures: None ordered   Follow-Up: At [facility], you and your health needs are our priority.  As part of our continuing mission to provide you with exceptional heart care, we have created designated Provider Care Teams.  These Care Teams include your primary Cardiologist (physician) and Advanced Practice Providers (APPs -  Physician Assistants and Nurse Practitioners) who all work together to provide you with the care you need, when you need it.  We recommend signing up  for the patient portal called "MyChart".  Sign up information is provided on this After Visit Summary.  MyChart is used to connect with patients for Virtual Visits (Telemedicine).  Patients are able to view lab/test results, encounter notes, upcoming appointments, etc.  Non-urgent messages can be sent to your provider as well.   To learn more about what you can do with MyChart, go to [email or web address].    Your next appointment:   6 month(s)  The format for your next appointment:   In Person  Provider:   You may see Glenetta Hew, MD or one of the following Advanced Practice Providers on your designated Care Team:   Rosaria Ferries, PA-C Caron Presume, PA-C Jory Sims, DNP, ANP    Studies Ordered:   Orders Placed This Encounter  Procedures   EKG 12-Lead     Glenetta Hew, M.D., M.S. Interventional Cardiologist   Pager # [phone] Phone # [phone] [address] [place], [place] [address]   Thank you for choosing Heart[facility] at [facility]!!

## 2021-04-07 NOTE — Patient Instructions (Addendum)
Your Plan:  Continue current treatment plan   Follow up in 6 months or call earlier if needed     Thank you for coming to see [place] at [facility]. I hope we have been able to provide you high quality care today.  You may receive a patient satisfaction survey over the next few weeks. We would appreciate your feedback and comments so that we may continue to improve ourselves and the health of our patients.

## 2021-04-07 NOTE — Patient Instructions (Signed)
Medication Instructions:  No changes *If you need a refill on your cardiac medications before your next appointment, please call your pharmacy*   Lab Work: None ordered If you have labs (blood work) drawn today and your tests are completely normal, you will receive your results only by: [place] (if you have MyChart) OR A paper copy in the mail If you have any lab test that is abnormal or we need to change your treatment, we will call you to review the results.   Testing/Procedures: None ordered   Follow-Up: At [facility], you and your health needs are our priority.  As part of our continuing mission to provide you with exceptional heart care, we have created designated Provider Care Teams.  These Care Teams include your primary Cardiologist (physician) and Advanced Practice Providers (APPs -  Physician Assistants and Nurse Practitioners) who all work together to provide you with the care you need, when you need it.  We recommend signing up for the patient portal called "MyChart".  Sign up information is provided on this After Visit Summary.  MyChart is used to connect with patients for Virtual Visits (Telemedicine).  Patients are able to view lab/test results, encounter notes, upcoming appointments, etc.  Non-urgent messages can be sent to your provider as well.   To learn more about what you can do with MyChart, go to [email or web address].    Your next appointment:   6 month(s)  The format for your next appointment:   In Person  Provider:   You may see Glenetta Hew, MD or one of the following Advanced Practice Providers on your designated Care Team:   Rosaria Ferries, PA-C Caron Presume, PA-C Jory Sims, DNP, ANP

## 2021-04-07 NOTE — Progress Notes (Signed)
[facility] PATIENT: [name] DOB: Feb 10, 1951   REASON FOR VISIT: Follow-up for tremor mild cognitive impairment, depression HISTORY FROM: Patient and wife  Chief Complaint  Patient presents with   Follow-up    Rm 2 with spouse [name]  Pt is well, tremors increase during pain Memory has slightly gotten worse since last visit         HISTORY OF PRESENT ILLNESS:HISTORY:63 year African American male who since last year and a half has noticed increasing tremors mainly in the left arm and leg and to a lesser degree in the right arm as well. He states the tremors were quite mild but became more pronounced after a minor accident while staying [place] the [place] in [place]. He fell down 12 flights of steps and hit a concrete slab and had a concussion. He and some minor bruises and knee injury which took several months to reck of her period CT scan of the head was done 2 days later which was unremarkable. Since then he has had some walking difficulty but this may be related to his knee pain. He says that his feet do at times gets stuck in his started walking with a stooped posture. He however does not describe typical festination. He has resting and intermittent action tremor claiming the left arm and leg the tremor does improve with activities. The tremor does not seem to interfere with most of his routine. He denies significant bradykinesia, and drooling of saliva or micrographia. He has been evaluated by neurologists Dr. Reginia Forts at [facility] but he is unable to tell me for the diagnosis was. He was not told that this may be Parkinson's and was not tried on dopaminergic medications. He did have an MRI scan and some lab work but I do not have those results for my review available today. Patient is here today for a second opinion as he feels his tremor is not getting better. He does admit to some light masonry hallucinations as well as restless sleep  thrashing of his legs. He denies significant memory or cognitive difficulties. He has not noticed any particular effect of alcohol on his tremor. Is no family history of tremors. Patient does have chronic hepatitis C and is planning to start treatment with dapsone. He has had no seizures, significant head injury with major loss of consciousness, stroke, TIAs or significant neurological problems.  Update 08/14/2014 : He returns for follow-up after last visit 3 months ago. He feels his tremors are about the same. He had tried reducing the dose of Sinemet but noticed that his tremors got worse and hence he went back up to the original dose which is 25/100 one tablet 3 times daily. He still feels occasionally confused and at times secondary to some cells but he admits that he worries a lot. He also admits to feeling depressed, getting tired easily and not having initiated. He has not been on any medications for depression. Patient denies significant drooling of saliva, bradykinesia, gait or balance problems. He feels his tremor is mild and does not interfere with his activities of daily living. I discussed alternative treatment options including addition of dopamine agonist, anticholinergic or even consideration for deep brain  stimulation since his tremor is predominantly unilateral however the patient does not want to consider more aggressive treatment options at the present time. UPDATE 12/26/14 [name], 70 year old black male returns for follow-up. He was last seen by Dr. [name] 08/14/2014. He feels his tremors are  better since increasing his carbidopa levodopa to 1-1/2 tablets 3 times daily. He is tolerating it well without dizziness, sleepiness, nightmares or hallucinations A MRI Brain with and without Contrast completed on 07/29/2011 was normal.  The Mini-Mental status exam today is unchanged 29 out of 30. He did not follow-up with his primary care for  complaints of depression. He denies any significant  drooling bradykinesia falls or balance problems. His tremor does not interfere with his activities of daily living. He returns for reevaluation Update 07/03/2015 : He returns for follow-up after last visit 6 months ago. He states his tremors continue to do well and he seems to have responded quite well to increased dose of Sinemet 25/100 1-1/2 tablet 3 times daily. Is tolerating it well without any dizziness, sleepiness, nightmares, hallucinations, nausea or diarrhea. He continues to have mild memory difficulties but his has not been on any treatment for depression and in fact has not seen his primary care physician for the same for quite some time. Update 08/11/2016 PS : He returns for follow-up after last visit year ago. He states his tremors as well as memory loss or more or less unchanged. Continues to have intermittent tremors in the left hand mainly. The tremors fluctuate he has good days and bad days. He is currently taking Sinemet 25/100 one and half tablet 3 times daily. He complains of mild fatigue and daytime sleepiness. He denies hallucinations, delusions, dizziness or upset stomach. He does not want to try higher dose because of fear of side effects. Continues to have short-term memory difficulties. He has not been quite compulsive about taking notes and using memory compensation strategies but plans to do so. He has no new complaints today. UPDATE 07/19/2018CM [name], 70 year old male returns for follow-up with tremors and mild cognitive impairment which he says is stable. He continues to have a resting tremor in the left hand which is intermittent. He continues to have good and bad days. He remains on Sinemet 25 100 (1 and half tablets) 3 times daily. He does little exercise he is not doing any memory compensation strategies at this time and was encouraged to do so. He denies any falls. He denies any hallucinations or increased confusion. He denies any freezing spells. He does complain of left hip  pain He returns for reevaluation UPDATE 1/22/2019CM [name], 49 he denies 23-year-old male returns for follow-up with a history of Parkinson's disease.  He continues to have mild resting tremor in the left hand which is intermittent.  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  He remains on Sinemet 3 times a day however he is taking his medication with a meal.  He complains of sometimes being lightheaded when he stands up too quickly.  He was encouraged to sit on the side of the bed and then get up slightly.  He was also encouraged to stay well-hydrated.  He has not been exercising.  He denies any hallucinations or increased confusion.  He has not had any freezing spells.  He returns for reevaluation 02/15/18 UPDATE:JV  Patient returns today for six-month follow-up.  He continues to take Sinemet 3 times daily with the first dose around 6 AM (1 hour prior to eating breakfast), second dose 11 AM (1 hour prior to eating lunch) and then third dose between 5:30 and 6 PM (eats dinner around 6:30-7 PM).  Patient denies any increase in symptoms between doses but does state if he is late on a dose, he will start  having increased tremors in his left upper extremity and left lower extremity.  He continues to complain of short-term memory issue and is unable to state whether it is worsening but he states he notices it more.  He did obtain a large book full avoid games but does this approximately 1 time per week.  Recommended to do these activities at least once daily.  Patient also has complaints of feeling as though he overanalyzes certain situations and feeling increased fatigue.  Patient does endorse increased stress recently but does feel somewhat situational.  Provided patient with stress relaxation techniques and advised him if this continues, to follow-up with PCP for possible medication management.  He denies any hallucinations or increased confusion.  He denies any freezing spells. UPDATE 1/29/2020CM Mr. Balyeat,  70 year old male returns for follow-up with a history of Parkinson's disease and mild cognitive impairment.  He denies any freezing spells any stiffness any falls.  He denies any difficulty swallowing.  He has spinal stenosis and had recent injection.  He is seeing Dr. Sharol Given for a foot problem.  He is currently on Sinemet 3 times a day before meals.  He has not been exercising and was encouraged to do so.  He has not had any hallucinations or increased confusion.  Noted very intermittent left tremor of the hand.  Patient reports some anxiety in public.  He was encouraged to perform stress relaxation techniques.  He returns for reevaluation  : Update 02/23/2019 :  he returns for follow-up after last visit 6 months ago.  He states his tremors are doing well until a month ago when he is noticed worsening of his tremor both at rest as well as using his hands.  He remains on Sinemet 25/100 mg 1-1/2 tablet 3 times daily.  Is tolerating them well without any nausea, diarrhea, dizziness, sleepiness or hallucinations.  He states his short-term memory difficulties are unchanged.  He has not been socializing a lot or being active but plans to do that soon.  He has no other complaints.  He denies significant drooling, bradykinesia, stooped posture gait or balance problems Update 09/04/2019 : He returns for follow-up after last visit 6 months ago.  He states that his tremors continue to intermittently not do well.  He has no more bad days than good days.  Has noticed some tremor in his legs as well intermittently.  Patient was advised to try higher dose of Sinemet 2 tablets 3 times daily at last visit.  He states he was not able to tolerate this because of dizziness and had to cut back to 1-1/2 3 times daily which is tolerating better.  He is also noticed that his balance is off but he blames this on having some problems in his toes.  He has had no major falls or injuries.  He can still get up easily from his chair.  He denies  significant drooling of saliva.  He does have some at times disturbed sleep and reviewed dreams and talks in his sleep.  Gets startled easily. Update 01/10/2020 : He returns for follow-up after last visit 4 months ago.  Is accompanied by his wife.  The patient was started on Azilect at last visit but had trouble tolerating it daily due to sleepiness, tiredness and dizziness.  He has since switched to using Azilect 0.5 mg twice daily every other day and seems to be tolerating it better.  However his tremors still persists and appear to get worse mostly when he is  anxious or with severe exertion.  He feels the tremor are not particularly disabling and he can handle it.  Continues to have poor short-term memory and needs to write things down but still remembers only occasionally.  Sometimes he may remember later.  Wife notes that occasionally he may pause in midsentence and searches for words.  At times even starts stuttering as he is trying to find the word.  He has no family history of Alzheimer's or dementia.  Still fairly independent and manages most activities for himself.  He remains currently on Sinemet 25/100 1.5 tablets 4 times daily which is tolerating well without significant nightmares, hallucinations, daytime sleepiness or nausea. Update 06/28/2020 : He returns for follow-up after last visit 6 months ago.  He is accompanied by his wife.  He continues to have intermittent tremors mostly in the left hand which are bothersome and he has good days and bad days.  Patient still continues to take Artane 2 mg tablets only half a tablet twice daily on alternate days and not every day as instructed at last visit.  He remains on Sinemet 25/100 mg 1.5 tablets 3 times daily and he has had trouble tolerating higher doses in the past.  Is not having significant dizziness or upset stomach but is still having some disturbed sleep with crazy dreams.  He denies hallucinations or delusions.  He is careful with his walking and  has had a few near falls but no major falls or injuries.  He denies excessive drooling of saliva or bradykinesia or mobility issues.  He does admit to decreased short-term memory, attention and concentration difficulties but these appear to be stable and unchanged. Update 10/01/2020 Dr. [name]: He returns for follow-up after last visit 3 months ago.  Patient did not tolerate increasing the dose of Artane to 2 mg daily as he developed increased dryness of mouth as well as bad dreams.  He has since reduced the dose back to 1 mg daily and is feeling better.  He still has intermittent resting tremor in both left hand as well as leg which does interfere with his activities of daily living slightly.  He remains on Sinemet 25/100 mg 1.5 tablets 3 times daily which is tolerating quite well without any dizziness, sleepiness, upset stomach, nightmares or hallucinations.  He notices occasional lip twitchings in the morning only.  He has no bradykinesia or increased drooling.  He can get up easily and to most of his activities without help.  He continues to have short-term memory problems which appear unchanged.  He forgets recent appointments and events.  He states he has decreased hearing bilaterally which is also seems to complicate his understanding and memory.  He is however quite independent in activities of daily living.  Update 04/07/2021 JM: Returns for 37-monthfollow-up after prior visit with Dr. SLeonie Man  He is accompanied by his wife, BPamala Maxwell Since prior visit, was hopitalized back in June for GI bleed.  He developed acute confusion most likely hospital delirium requiring short course of Precedex. Per wife, she is concerned regarding worsening cognition with short term memory and delayed recall since that time as well as multiple other concerns including worsening lower back pain, decreased hearing and vision and chills/coldness sensation in both feet and hands. Tremors have been stable with ongoing use of Sinemet and  Artane.  No further concerns at this time     REVIEW OF SYSTEMS: Full 14 system review of systems performed and notable only for those listed  in HPI only and all other systems negative   ALLERGIES: No Known Allergies  HOME MEDICATIONS: Outpatient Medications Prior to Visit  Medication Sig Dispense Refill   acetaminophen (TYLENOL) 500 MG tablet Take 500 mg by mouth every 6 (six) hours as needed for mild pain, fever or headache.     alfuzosin (UROXATRAL) 10 MG 24 hr tablet TAKE 1 TABLET BY MOUTH ONCE A DAY 90 tablet 3   bismuth subsalicylate (PEPTO BISMOL) 262 MG chewable tablet Chew 524 mg by mouth as needed for diarrhea or loose stools.     carbidopa-levodopa (SINEMET IR) 25-100 MG tablet TAKE 1 AND 1/2 TABLETS BY MOUTH THREE TIMES DAILY 405 tablet 1   citalopram (CELEXA) 20 MG tablet Take 1 tablet (20 mg total) by mouth daily. 30 tablet 1   docusate sodium (COLACE) 100 MG capsule Take 200 mg by mouth daily.     gabapentin (NEURONTIN) 300 MG capsule Take 1 capsule by mouth daily at bedtime (Patient taking differently: as needed.) 30 capsule 0   irbesartan (AVAPRO) 75 MG tablet Take 1 tablet (75 mg total) by mouth every morning. 30 tablet 1   L-Methylfolate-B12-B6-B2 (CEREFOLIN) 12-25-48-5 MG TABS Take 1 tablet by mouth daily. 90 tablet 3   Multiple Vitamin (MULTIVITAMIN WITH MINERALS) TABS tablet Take 1 tablet by mouth daily.     nitroGLYCERIN (NITROSTAT) 0.4 MG SL tablet Place 1 tablet (0.4 mg total) under the tongue every 5 (five) minutes as needed for chest pain. 25 tablet 2   oxyCODONE (OXY IR/ROXICODONE) 5 MG immediate release tablet Take 1 tablet by mouth twice a day as needed. 15 tablet 0   pantoprazole (PROTONIX) 40 MG tablet Take 1 tablet (40 mg total) by mouth 2 (two) times daily. 180 tablet 0   simvastatin (ZOCOR) 40 MG tablet TAKE 1 TABLET BY MOUTH AT BEDTIME 90 tablet 3   timolol (TIMOPTIC) 0.5 % ophthalmic solution Place 1 drop into both eyes daily.     trihexyphenidyl  (ARTANE) 2 MG tablet Take 1/2 tablet by mouth twice daily with a meal or as directed. 30 tablet 2   verapamil (CALAN-SR) 120 MG CR tablet Take 1 tablet (120 mg total) by mouth daily. 30 tablet 1   VITAMIN D PO Take 1 capsule by mouth daily.     No facility-administered medications prior to visit.    PAST MEDICAL HISTORY: Past Medical History:  Diagnosis Date   Allergy    Arthritis    BPH (benign prostatic hyperplasia)    Complication of anesthesia    Pt. stated he had a reaction that ended in him requiring urinary cath placement   Diverticulosis    GERD (gastroesophageal reflux disease)    esophageal spasms   Glaucoma    Gout    Head injury, closed, with concussion    Hepatitis C    chronic - Has been treated with Harvoni   HLD (hyperlipidemia)    statin intolerant (Crestor & Simvastatin) - Taking Livalo '1mg'$  / week   Hypertension    Parkinson's disease ([place])    Plantar fasciitis    right   PVD (peripheral vascular disease) ([place])    With no claudication; only mild abdominal aortic atherosclerosis noted on ultrasound.   Thoracic ascending aortic aneurysm (HCC)    4.2 cm ascending TAA 09/2016 CT, 1 yr f/u rec   Ulcer     PAST SURGICAL HISTORY: Past Surgical History:  Procedure Laterality Date   BUNIONECTOMY WITH WEIL OSTEOTOMY Right 11/02/2019  Procedure: Right Foot Lapidus, Modified McBride Bunionectomy,  Hallux Akin Osteotomy;  Surgeon: Wylene Simmer, MD;  Location: [facility];  Service: Orthopedics;  Laterality: Right;   CARDIAC CATHETERIZATION  2005   30% Cx. Dr. Melvern Banker   cataract surgery Left 11/05/2014   COLONOSCOPY     COLONOSCOPY N/A 01/09/2021   Procedure: COLONOSCOPY;  Surgeon: Ronnette Juniper, MD;  Location: [facility];  Service: Gastroenterology;  Laterality: N/A;   ESOPHAGOGASTRODUODENOSCOPY N/A 05/02/2015   Procedure: ESOPHAGOGASTRODUODENOSCOPY (EGD);  Surgeon: Ronald Lobo, MD;  Location: [facility];  Service: Endoscopy;  Laterality: N/A;    ESOPHAGOGASTRODUODENOSCOPY  05/02/2015   no source of pt chest pain endoscopically evident. small hiatal hernia.   ESOPHAGOGASTRODUODENOSCOPY (EGD) WITH PROPOFOL N/A 01/02/2021   Procedure: ESOPHAGOGASTRODUODENOSCOPY (EGD) WITH PROPOFOL;  Surgeon: Clarene Essex, MD;  Location: [facility];  Service: Endoscopy;  Laterality: N/A;   EYE SURGERY     HARDWARE REMOVAL Right 11/02/2019   Procedure: Second Metatarsal Removal of Deep Implant and Rotational Osteotomy;  Surgeon: Wylene Simmer, MD;  Location: [facility];  Service: Orthopedics;  Laterality: Right;   IR ANGIOGRAM SELECTIVE EACH ADDITIONAL VESSEL  01/04/2021   IR ANGIOGRAM SELECTIVE EACH ADDITIONAL VESSEL  01/04/2021   IR ANGIOGRAM VISCERAL SELECTIVE  01/04/2021   IR US GUIDE VASC ACCESS RIGHT  01/04/2021   MEMBRANE PEEL Left 03/14/2014   Procedure: MEMBRANE PEEL; ENDOLASER;  Surgeon: Hurman Horn, MD;  Location: [facility];  Service: Ophthalmology;  Laterality: Left;   NM MYOVIEW LTD  2011   Neg Ischemia or infarct.   NM MYOVIEW LTD  10/2015   LOW RISK. Small, fixed basal lateral defect - likely diaphragmatic attenuation. EF 69%   PARS PLANA VITRECTOMY Left 03/14/2014   Procedure: PARS PLANA VITRECTOMY WITH 25 GAUGE;  Surgeon: Hurman Horn, MD;  Location: [facility];  Service: Ophthalmology;  Laterality: Left;   TONSILLECTOMY     TRANSTHORACIC ECHOCARDIOGRAM  2013   Normal EF. No significant Valve Disease   UPPER GASTROINTESTINAL ENDOSCOPY     WEIL OSTEOTOMY Right 09/01/2017   Procedure: RIGHT GREAT TOE CHEVRON AND WEIL OSTEOTOMY 2ND METATARSAL;  Surgeon: Newt Minion, MD;  Location: [facility];  Service: Orthopedics;  Laterality: Right;    FAMILY HISTORY: Family History  Problem Relation Age of Onset   Heart disease Mother    Cancer Paternal Grandfather    Cancer Sister     SOCIAL HISTORY: Social History   Socioeconomic History   Marital status: Married    Spouse name: barbra gen   Number of children: 3   Years of education: AS    Highest education level: Not on file  Occupational History   Occupation: self employed    Employer: [employer]    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  Tobacco Use   Smoking status: Former    Types: Cigars    Quit date: 07/27/1988    Years since quitting: 32.7   Smokeless tobacco: Never  Vaping Use   Vaping Use: Never used  Substance and Sexual Activity   Alcohol use: Yes    Alcohol/week: 3.0 standard drinks    Types: 1 Glasses of wine, 1 Cans of beer, 1 Shots of liquor per week    Comment: occasional   Drug use: No   Sexual activity: Yes  Other Topics Concern   Not on file  Social History Narrative   Lives with wife    Right handed   Drinks 1-2 cups caffeine daily   Social  Determinants of Health   Financial Resource Strain: Not on file  Food Insecurity: Not on file  Transportation Needs: Not on file  Physical Activity: Not on file  Stress: Not on file  Social Connections: Not on file  Intimate Partner Violence: Not on file     PHYSICAL EXAM  Vitals:   04/07/21 1431  BP: 116/78  Pulse: 69  Weight: 156 lb (70.8 kg)  Height: '5\' 10"'$  (1.778 m)   Body mass index is 22.38 kg/m.  Generalized: Frail pleasant elderly African-American male in no acute distress   Head: normocephalic and atraumatic  Neck: Supple,  Musculoskeletal: No deformity  Neurological examination   Mentation: Awake and alert.  Fluent speech and language. MMSE - Mini Mental State Exam 04/07/2021 08/24/2018 05/11/2014  Orientation to time '5 5 5  '$ Orientation to Place '5 5 5  '$ Registration '3 3 3  '$ Attention/ Calculation '5 5 5  '$ Recall '1 2 2  '$ Language- name 2 objects '2 2 2  '$ Language- repeat '1 1 1  '$ Language- follow 3 step command '2 3 3  '$ Language- read & follow direction '1 1 1  '$ Write a sentence '1 1 1  '$ Write a sentence-comments - no subject -  Copy design 0 1 1  Total score '26 29 29   '$ Cranial nerve II-XII: Pupils were equal round reactive to light extraocular movements were full, visual field were full on  confrontational test. Facial sensation and strength were normal. hearing was intact to finger rubbing bilaterally. Uvula tongue midline. head turning and shoulder shrug were normal and symmetric.Tongue protrusion into cheek strength was normal. Motor: normal bulk and tone, full strength in the BUE, BLE, .  Diminished amplitude of finger tapping on the left, intermittent left hand and leg resting tremor, mild cogwheel rigidity upon activation at the left wrist. Tremor improves with action and intention.  No bradykinesia Sensory: normal and symmetric to light touch, on the face arms and legs  Coordination: finger-nose-finger, heel-to-shin bilaterally, no dysmetria Reflexes: 1+ upper lower and symmetric plantar responses were flexor bilaterally. Gait and Station: Rising up from seated position without assistance, no festination or stooped posture.  Ambulated well with moderate stride, decreased arm swing on the left., smooth turning, no assistive device.     DIAGNOSTIC DATA (LABS, IMAGING, TESTING) - I reviewed patient records, labs, notes, testing and imaging myself where available.  Lab Results  Component Value Date   WBC 5.7 02/14/2021   HGB 11.0 (L) 02/14/2021   HCT 34.7 (L) 02/14/2021   MCV 89 02/14/2021   PLT 230 02/14/2021      Component Value Date/Time   NA 139 01/28/2021 1129   K 4.7 01/28/2021 1129   CL 104 01/28/2021 1129   CO2 22 01/28/2021 1129   GLUCOSE 79 01/28/2021 1129   GLUCOSE 96 01/16/2021 0557   BUN 17 01/28/2021 1129   CREATININE 1.18 01/28/2021 1129   CREATININE 1.06 06/29/2014 0920   CALCIUM 8.8 01/28/2021 1129   PROT 6.3 01/28/2021 1129   ALBUMIN 4.0 01/28/2021 1129   AST 26 01/28/2021 1129   ALT 13 01/28/2021 1129   ALKPHOS 71 01/28/2021 1129   BILITOT 0.2 01/28/2021 1129   GFRNONAA >60 01/16/2021 0557   GFRAA 68 09/10/2020 1058   Lab Results  Component Value Date   CHOL 76 01/07/2021   HDL 35 (L) 01/07/2021   LDLCALC 34 01/07/2021   TRIG 36  01/07/2021   CHOLHDL 2.2 01/07/2021   Lab Results  Component Value Date  TSH 3.131 01/11/2021     ASSESSMENT AND PLAN 70 y.o. year old male  has a medical history of resting left arm and leg tremors with mild features of early left sided Parkinson's disease initially present in 2014. He also complains of mild cognitive impairment    1.  Parkinsonian tremor -Stable -Continue Sinemet and Artane at current dosage -Previously declined interest in deep brain stimulation   2.  MCI -Likely recent gradual worsening multifactorial in setting of recent hospitalization (12/2020 for GI bleed), lower back pain recently worsening limiting ambulation and functioning and decreased hearing -MMSE today 26/30 -Discussed continue memory exercises and importance of keeping active as tolerated and ensuring adequate sleep -May need to consider MRI brain in the future if he continues to experience decline -Continue Cerefolin NAC 1 tab daily   Follow-up in 6 months or call earlier if needed   CC:  [place] provider: Dr. Jomarie Longs, Elyse Jarvis, MD   I spent 36 minutes of face-to-face and non-face-to-face time with patient and wife.  This included previsit chart review, lab review, study review, order entry, electronic health record documentation, patient education and discussion regarding parkinsonian tremor and current use of medications, MCI with recent gradual worsening and likely contributing factors, completion and review of MMSE and answered all other questions to patient and wife satisfaction  Frann Rider, AGNP-BC  [facility] [address] [place], [place] [address]  Phone [phone] Fax [phone] Note: This document was prepared with digital dictation and possible smart phrase technology. Any transcriptional errors that result from this process are unintentional.

## 2021-04-08 DIAGNOSIS — M47816 Spondylosis without myelopathy or radiculopathy, lumbar region: Secondary | ICD-10-CM | POA: Diagnosis not present

## 2021-04-09 DIAGNOSIS — H01021 Squamous blepharitis right upper eyelid: Secondary | ICD-10-CM | POA: Diagnosis not present

## 2021-04-09 DIAGNOSIS — H4720 Unspecified optic atrophy: Secondary | ICD-10-CM | POA: Diagnosis not present

## 2021-04-09 DIAGNOSIS — H01024 Squamous blepharitis left upper eyelid: Secondary | ICD-10-CM | POA: Diagnosis not present

## 2021-04-09 DIAGNOSIS — Z961 Presence of intraocular lens: Secondary | ICD-10-CM | POA: Diagnosis not present

## 2021-04-09 DIAGNOSIS — H04123 Dry eye syndrome of bilateral lacrimal glands: Secondary | ICD-10-CM | POA: Diagnosis not present

## 2021-04-09 DIAGNOSIS — H401132 Primary open-angle glaucoma, bilateral, moderate stage: Secondary | ICD-10-CM | POA: Diagnosis not present

## 2021-04-15 ENCOUNTER — Telehealth: Payer: Self-pay | Admitting: Cardiology

## 2021-04-15 NOTE — Telephone Encounter (Signed)
Patient saw Dr. Ellyn Hack on 09/12 and states that he forgot to tell him that he has been experiencing cold/numb fingers and toes. Denied any other symptoms.

## 2021-04-15 NOTE — Telephone Encounter (Signed)
Attempted to contact- unable to reach.  Left voicemail to call back.

## 2021-04-16 ENCOUNTER — Other Ambulatory Visit (HOSPITAL_COMMUNITY): Payer: Self-pay

## 2021-04-16 MED ORDER — PREDNISONE 10 MG PO TABS
ORAL_TABLET | ORAL | 0 refills | Status: DC
  Filled 2021-04-16: qty 21, 6d supply, fill #0

## 2021-04-17 ENCOUNTER — Other Ambulatory Visit (HOSPITAL_COMMUNITY): Payer: Self-pay

## 2021-04-21 ENCOUNTER — Encounter: Payer: Self-pay | Admitting: Family Medicine

## 2021-04-22 NOTE — Telephone Encounter (Signed)
Lm to call back Will await return call from pt ./cy

## 2021-04-26 ENCOUNTER — Encounter: Payer: Self-pay | Admitting: Cardiology

## 2021-04-26 NOTE — Assessment & Plan Note (Signed)
On simvastatin, his most recent lipids look incredible back in June.  I wonder if this was probably related to his chronic illness.  Continue to follow-up with PCP.

## 2021-04-26 NOTE — Assessment & Plan Note (Signed)
Borderline pressures today.  He says that usually better than this at home.  He is on verapamil and irbesartan. No room to titrate the verapamil any further.  If pressures remain elevated, would potentially increase ARB dose. ->  Could easily be rate increased up to 150 mg.

## 2021-04-26 NOTE — Assessment & Plan Note (Signed)
When I last saw him, I was concerned about the chest discomfort, but now it appears it was probably mostly epigastric in the reason for his bleed.  We will plan to do a coronary CTA, but based on the fact that he is not having any more symptoms, and tolerated profound anemia without chest pain, I think we can forego ischemic evaluation with either Myoview or coronary CTA.  Continued to treat for cardiac risk factors : blood pressure, lipids and chemistry.

## 2021-04-26 NOTE — Assessment & Plan Note (Signed)
Continue risk factor modification, reassess AAA Doppler next year.  .  Blood pressure, lipid and glycemic control.

## 2021-04-26 NOTE — Assessment & Plan Note (Signed)
We discussed doing a coronary CTA.  Lots of events happened in the interim, and that never happened.  Thankfully she he was totally asymptomatic with a GI bleed as far as cardiac symptoms.  For now we will simply treat risk factors, but we can discuss coronary calcium score at follow-up visit.  No need to check now because it would not change therapy.

## 2021-04-30 ENCOUNTER — Other Ambulatory Visit (HOSPITAL_COMMUNITY): Payer: Self-pay

## 2021-04-30 DIAGNOSIS — M5416 Radiculopathy, lumbar region: Secondary | ICD-10-CM | POA: Diagnosis not present

## 2021-04-30 MED ORDER — HYDROCODONE-ACETAMINOPHEN 10-325 MG PO TABS
ORAL_TABLET | ORAL | 0 refills | Status: DC
  Filled 2021-04-30: qty 20, 5d supply, fill #0

## 2021-04-30 MED ORDER — MELOXICAM 15 MG PO TABS
ORAL_TABLET | ORAL | 0 refills | Status: DC
  Filled 2021-04-30: qty 14, 14d supply, fill #0

## 2021-05-01 ENCOUNTER — Ambulatory Visit (INDEPENDENT_AMBULATORY_CARE_PROVIDER_SITE_OTHER): Payer: Medicare Other

## 2021-05-01 ENCOUNTER — Other Ambulatory Visit: Payer: Self-pay

## 2021-05-01 DIAGNOSIS — Z23 Encounter for immunization: Secondary | ICD-10-CM | POA: Diagnosis not present

## 2021-05-04 ENCOUNTER — Other Ambulatory Visit: Payer: Self-pay | Admitting: Family Medicine

## 2021-05-04 ENCOUNTER — Other Ambulatory Visit (HOSPITAL_COMMUNITY): Payer: Self-pay

## 2021-05-05 ENCOUNTER — Other Ambulatory Visit (HOSPITAL_COMMUNITY): Payer: Self-pay

## 2021-05-05 MED ORDER — CITALOPRAM HYDROBROMIDE 20 MG PO TABS
20.0000 mg | ORAL_TABLET | Freq: Every day | ORAL | 1 refills | Status: DC
  Filled 2021-05-05: qty 30, 30d supply, fill #0
  Filled 2021-06-08: qty 30, 30d supply, fill #1

## 2021-05-05 MED ORDER — IRBESARTAN 75 MG PO TABS
75.0000 mg | ORAL_TABLET | Freq: Every morning | ORAL | 1 refills | Status: DC
  Filled 2021-05-05: qty 90, 90d supply, fill #0
  Filled 2021-07-23: qty 90, 90d supply, fill #1

## 2021-05-05 MED ORDER — MELOXICAM 15 MG PO TABS
ORAL_TABLET | ORAL | 0 refills | Status: DC
  Filled 2021-05-05: qty 14, 14d supply, fill #0

## 2021-05-05 MED ORDER — VERAPAMIL HCL ER 120 MG PO TBCR
120.0000 mg | EXTENDED_RELEASE_TABLET | Freq: Every day | ORAL | 1 refills | Status: DC
  Filled 2021-05-05: qty 90, 90d supply, fill #0
  Filled 2021-08-03: qty 90, 90d supply, fill #1

## 2021-05-05 NOTE — Telephone Encounter (Signed)
[facility] is requesting to fill pt celexa. Please advise. Kh

## 2021-05-06 ENCOUNTER — Other Ambulatory Visit (HOSPITAL_COMMUNITY): Payer: Self-pay

## 2021-05-12 ENCOUNTER — Other Ambulatory Visit (HOSPITAL_COMMUNITY): Payer: Self-pay

## 2021-05-13 ENCOUNTER — Ambulatory Visit (INDEPENDENT_AMBULATORY_CARE_PROVIDER_SITE_OTHER): Payer: Medicare Other | Admitting: Family Medicine

## 2021-05-13 ENCOUNTER — Other Ambulatory Visit: Payer: Self-pay

## 2021-05-13 VITALS — BP 96/62 | HR 84 | Temp 96.8°F | Wt 156.4 lb

## 2021-05-13 DIAGNOSIS — G8929 Other chronic pain: Secondary | ICD-10-CM

## 2021-05-13 DIAGNOSIS — K429 Umbilical hernia without obstruction or gangrene: Secondary | ICD-10-CM

## 2021-05-13 DIAGNOSIS — M545 Low back pain, unspecified: Secondary | ICD-10-CM | POA: Diagnosis not present

## 2021-05-13 NOTE — Progress Notes (Signed)
   Subjective:    Patient ID: [name], male    DOB: 06-17-1951, 70 y.o.   MRN: [id number]  HPI He is here for consult concerning doing a preoperative evaluation for umbilical hernia repair.  Also of note is the fact that he continues have difficulty with foot pain and is seeing Dr. Doran Maxwell for that.  [facility] apparently referred him to Dr. Rolena Maxwell because he was complaining of back pain not realizing that [name] had already been seen by neurosurgery for this.  He is now in the process of getting an epidural which is scheduled for October 25.   Review of Systems     Objective:   Physical Exam Alert and in no distress otherwise not examined       Assessment & Plan:  Chronic low back pain, unspecified back pain laterality, unspecified whether sciatica present  Umbilical hernia without obstruction and without gangrene I discussed epidural injections with him which he plans on having.  I explained that I thought would be reasonable to try this 1 or 2 more times to see if that will relieve the pain and then he will need to make the decision as to whether to have the orthopedic surgeon or the neurosurgeon do the procedure.  I explained that I think they are both equally qualified.  He will continue to be followed by Dr. Doran Maxwell for his foot pain.  I explained that I thought would be reasonable to hold off on doing any surgery on his umbilical hernia until he gets the back pain taken care of.  He was comfortable with that.  Over 30 minutes spent discussing this with him and his wife.

## 2021-05-14 ENCOUNTER — Other Ambulatory Visit (HOSPITAL_COMMUNITY): Payer: Self-pay

## 2021-05-14 ENCOUNTER — Encounter: Payer: Self-pay | Admitting: Family Medicine

## 2021-05-14 MED ORDER — HYDROCODONE-ACETAMINOPHEN 10-325 MG PO TABS
ORAL_TABLET | ORAL | 0 refills | Status: DC
  Filled 2021-05-14: qty 40, 10d supply, fill #0

## 2021-05-19 ENCOUNTER — Telehealth: Payer: Self-pay | Admitting: Family Medicine

## 2021-05-19 NOTE — Chronic Care Management (AMB) (Signed)
   Chronic Care Management   Note  05/19/2021 Name: [name] MRN: [id number] DOB: [birth year]  [name] is a 70 y.o. year old male who is a primary care patient of [employer], Elyse Jarvis, MD. I reached out to [name] by phone today in response to a referral sent by [name]'s PCP, Denita Lung, MD.   [name] was given information about Chronic Care Management services today including:  CCM service includes personalized support from designated clinical staff supervised by his physician, including individualized plan of care and coordination with other care providers 24/7 contact phone numbers for assistance for urgent and routine care needs. Service will only be billed when office clinical staff spend 20 minutes or more in a month to coordinate care. Only one practitioner may furnish and bill the service in a calendar month. The patient may stop CCM services at any time (effective at the end of the month) by phone call to the office staff.   Jacqualin Combes verbally agreed to assistance and services provided by embedded care coordination/care management team today.  Follow up plan:   Tatjana Secretary/administrator

## 2021-05-19 NOTE — Chronic Care Management (AMB) (Signed)
  Chronic Care Management   Outreach Note  05/19/2021 Name: [name] MRN: [id number] DOB: 03-22-1951  Referred by: Denita Lung, MD Reason for referral : No chief complaint on file.   An unsuccessful telephone outreach was attempted today. The patient was referred to the pharmacist for assistance with care management and care coordination.   Follow Up Plan:   Tatjana Dellinger Upstream Scheduler

## 2021-05-20 DIAGNOSIS — M5416 Radiculopathy, lumbar region: Secondary | ICD-10-CM | POA: Diagnosis not present

## 2021-05-26 ENCOUNTER — Telehealth: Payer: Self-pay

## 2021-05-26 NOTE — Telephone Encounter (Signed)
Patient called back to cancel his appointment with Dr. Hampton Abbot for 06/06/21.  He wants to focus on getting his back taken care of first.  They state will call back and reschedule with Dr. Hampton Abbot at a later date.

## 2021-05-26 NOTE — Telephone Encounter (Signed)
Left message to check status of appointmnet with Dr.John [employer] for medical clearance-patient is scheduled for appointment here with Dr.Piscoya-ask if patient has seen Dr.[name] and to call office to let them know we need clearance form completed.   Medical Clearance re-faxed to DR.[name] at this time.

## 2021-06-04 ENCOUNTER — Other Ambulatory Visit (HOSPITAL_COMMUNITY): Payer: Self-pay

## 2021-06-04 DIAGNOSIS — M5416 Radiculopathy, lumbar region: Secondary | ICD-10-CM | POA: Diagnosis not present

## 2021-06-04 MED ORDER — HYDROCODONE-ACETAMINOPHEN 10-325 MG PO TABS
ORAL_TABLET | ORAL | 0 refills | Status: DC
  Filled 2021-06-04: qty 40, 10d supply, fill #0

## 2021-06-06 ENCOUNTER — Ambulatory Visit: Payer: Medicare Other | Admitting: Surgery

## 2021-06-06 DIAGNOSIS — M48061 Spinal stenosis, lumbar region without neurogenic claudication: Secondary | ICD-10-CM | POA: Diagnosis not present

## 2021-06-08 ENCOUNTER — Other Ambulatory Visit (HOSPITAL_COMMUNITY): Payer: Self-pay

## 2021-06-08 ENCOUNTER — Other Ambulatory Visit: Payer: Self-pay | Admitting: Family Medicine

## 2021-06-09 ENCOUNTER — Other Ambulatory Visit (HOSPITAL_COMMUNITY): Payer: Self-pay

## 2021-06-09 ENCOUNTER — Other Ambulatory Visit: Payer: Self-pay | Admitting: Orthopedic Surgery

## 2021-06-09 DIAGNOSIS — M5459 Other low back pain: Secondary | ICD-10-CM

## 2021-06-09 MED ORDER — PANTOPRAZOLE SODIUM 40 MG PO TBEC
40.0000 mg | DELAYED_RELEASE_TABLET | Freq: Two times a day (BID) | ORAL | 0 refills | Status: DC
  Filled 2021-06-09: qty 180, 90d supply, fill #0

## 2021-06-10 ENCOUNTER — Telehealth: Payer: Self-pay | Admitting: *Deleted

## 2021-06-10 NOTE — Telephone Encounter (Signed)
Completed and placed in outbox.  Thank you.

## 2021-06-10 NOTE — Telephone Encounter (Signed)
Surgical clearance letter completed, signed and faxed to [facility]. Received confirmation.

## 2021-06-10 NOTE — Telephone Encounter (Signed)
Received surgical clearance letter form [facility] re: lumbar decompression.  Placed on [employer].

## 2021-06-12 ENCOUNTER — Encounter: Payer: Self-pay | Admitting: Neurology

## 2021-06-12 ENCOUNTER — Encounter: Payer: Self-pay | Admitting: Cardiology

## 2021-06-13 ENCOUNTER — Telehealth: Payer: Self-pay | Admitting: *Deleted

## 2021-06-13 NOTE — Telephone Encounter (Signed)
   Pre-operative Risk Assessment    Patient Name: [name]  DOB: 1950/12/04 MRN: [id number]      Request for Surgical Clearance   Procedure:   LUMBAR DECOMPRESSION FROM L3-L5; AND INSITU L4-L5 FUSION  Date of Surgery: Clearance TBD                                 Surgeon:  DR. [facility] Surgeon's Group or Practice Name:  [facility] Phone number:  [phone] Fax number:  [phone] ATTN SHERRY WILLS    Type of Clearance Requested: - Medical    Type of Anesthesia:   CHOICE   Additional requests/questions:   Jiles Prows   06/13/2021, 11:27 AM

## 2021-06-13 NOTE — Telephone Encounter (Signed)
   Primary Cardiologist: Glenetta Hew, MD  Chart reviewed as part of pre-operative protocol coverage. Given past medical history and time since last visit, based on ACC/AHA guidelines, [name] would be at acceptable risk for the planned procedure without further cardiovascular testing.    I will route this recommendation to the requesting party via Epic fax function and remove from pre-op pool.  Please call with questions.  Jossie Ng. Tobey Schmelzle NP-C    06/13/2021, 12:38 PM [place] [address] Office [phone] Fax [phone]

## 2021-06-15 ENCOUNTER — Other Ambulatory Visit: Payer: Self-pay | Admitting: Family Medicine

## 2021-06-16 ENCOUNTER — Other Ambulatory Visit (HOSPITAL_COMMUNITY): Payer: Self-pay

## 2021-06-16 MED ORDER — HYDROCODONE-ACETAMINOPHEN 10-325 MG PO TABS
ORAL_TABLET | ORAL | 0 refills | Status: DC
  Filled 2021-06-16: qty 60, 15d supply, fill #0

## 2021-06-16 MED ORDER — CITALOPRAM HYDROBROMIDE 20 MG PO TABS
20.0000 mg | ORAL_TABLET | Freq: Every day | ORAL | 1 refills | Status: DC
  Filled 2021-06-16 – 2021-07-05 (×2): qty 30, 30d supply, fill #0
  Filled 2021-07-23 – 2021-08-03 (×2): qty 30, 30d supply, fill #1

## 2021-06-16 NOTE — Telephone Encounter (Signed)
[facility] is requesting to fill pt celexa. Please advise [facility]

## 2021-06-23 ENCOUNTER — Ambulatory Visit (INDEPENDENT_AMBULATORY_CARE_PROVIDER_SITE_OTHER): Payer: Medicare Other | Admitting: Ophthalmology

## 2021-06-23 ENCOUNTER — Other Ambulatory Visit: Payer: Self-pay

## 2021-06-23 ENCOUNTER — Other Ambulatory Visit (HOSPITAL_COMMUNITY): Payer: Self-pay

## 2021-06-23 ENCOUNTER — Encounter (INDEPENDENT_AMBULATORY_CARE_PROVIDER_SITE_OTHER): Payer: Self-pay | Admitting: Ophthalmology

## 2021-06-23 DIAGNOSIS — H34812 Central retinal vein occlusion, left eye, with macular edema: Secondary | ICD-10-CM

## 2021-06-23 DIAGNOSIS — H401121 Primary open-angle glaucoma, left eye, mild stage: Secondary | ICD-10-CM

## 2021-06-23 MED ORDER — LATANOPROST 0.005 % OP SOLN
1.0000 [drp] | Freq: Every day | OPHTHALMIC | 8 refills | Status: AC
  Filled 2021-06-23: qty 2.5, 50d supply, fill #0
  Filled 2021-07-24: qty 2.5, 50d supply, fill #1
  Filled 2021-10-09: qty 2.5, 50d supply, fill #2

## 2021-06-23 NOTE — Assessment & Plan Note (Signed)
Add topical latanoprost 1 drop left eye bedtime

## 2021-06-23 NOTE — Progress Notes (Signed)
06/23/2021     CHIEF COMPLAINT Patient presents for  Chief Complaint  Patient presents with   Retina Follow Up      HISTORY OF PRESENT ILLNESS: [name] is a 70 y.o. male who presents to the clinic today for:   HPI     Retina Follow Up   Patient presents with  CRVO/BRVO.  In both eyes.  This started 6 months ago.  Duration of 6 months.  Since onset it is stable.        Comments   6 month f/u OU with OCT and FP  Pt states he feels that he sees worse than what other people think he sees.   EyeMeds: Timolol QAM OU      Last edited by Reather Littler, COA on 06/23/2021 10:49 AM.      Referring physician: Denita Lung, MD [place],  [place] [phone]  HISTORICAL INFORMATION:   Selected notes from the MEDICAL RECORD NUMBER       CURRENT MEDICATIONS: Current Outpatient Medications (Ophthalmic Drugs)  Medication Sig   latanoprost (XALATAN) 0.005 % ophthalmic solution Place 1 drop into the left eye daily.   timolol (TIMOPTIC) 0.5 % ophthalmic solution Place 1 drop into both eyes daily.   No current facility-administered medications for this visit. (Ophthalmic Drugs)   Current Outpatient Medications (Other)  Medication Sig   acetaminophen (TYLENOL) 500 MG tablet Take 500 mg by mouth every 6 (six) hours as needed for mild pain, fever or headache.   alfuzosin (UROXATRAL) 10 MG 24 hr tablet TAKE 1 TABLET BY MOUTH ONCE A DAY   bismuth subsalicylate (PEPTO BISMOL) 262 MG chewable tablet Chew 524 mg by mouth as needed for diarrhea or loose stools. (Patient not taking: Reported on 05/13/2021)   carbidopa-levodopa (SINEMET IR) 25-100 MG tablet TAKE 1 AND 1/2 TABLETS BY MOUTH THREE TIMES DAILY   citalopram (CELEXA) 20 MG tablet Take 1 tablet (20 mg total) by mouth daily.   docusate sodium (COLACE) 100 MG capsule Take 200 mg by mouth daily.   gabapentin (NEURONTIN) 300 MG capsule Take 1 capsule by mouth daily at bedtime (Patient not taking:  Reported on 05/13/2021)   HYDROcodone-acetaminophen (NORCO) 10-325 MG tablet Take 1 tablet by mouth 4 times a day as needed.   irbesartan (AVAPRO) 75 MG tablet Take 1 tablet (75 mg total) by mouth every morning.   L-Methylfolate-B12-B6-B2 (CEREFOLIN) 12-25-48-5 MG TABS Take 1 tablet by mouth daily.   meloxicam (MOBIC) 15 MG tablet Take 1 tablet by mouth every day with a meal for 14 days.   Multiple Vitamin (MULTIVITAMIN WITH MINERALS) TABS tablet Take 1 tablet by mouth daily.   nitroGLYCERIN (NITROSTAT) 0.4 MG SL tablet Place 1 tablet (0.4 mg total) under the tongue every 5 (five) minutes as needed for chest pain.   oxyCODONE (OXY IR/ROXICODONE) 5 MG immediate release tablet Take 1 tablet by mouth twice a day as needed. (Patient not taking: Reported on 05/13/2021)   pantoprazole (PROTONIX) 40 MG tablet Take 1 tablet (40 mg total) by mouth 2 (two) times daily.   predniSONE (DELTASONE) 10 MG tablet Take 6 tablets by mouth on day 1, then 5 tabs on day 2, then 4 tabs on day 3, then 3 tabs on day 4, then 2 tabs on day 5, then 1 tab on day 6. (Patient not taking: Reported on 05/13/2021)   simvastatin (ZOCOR) 40 MG tablet TAKE 1 TABLET BY MOUTH AT BEDTIME   trihexyphenidyl (  ARTANE) 2 MG tablet Take 1/2 tablet (1 mg total) by mouth 2 (two) times daily with a meal.   verapamil (CALAN-SR) 120 MG CR tablet Take 1 tablet (120 mg total) by mouth daily.   VITAMIN D PO Take 1 capsule by mouth daily.   No current facility-administered medications for this visit. (Other)      REVIEW OF SYSTEMS:    ALLERGIES No Known Allergies  PAST MEDICAL HISTORY Past Medical History:  Diagnosis Date   Allergy    Arthritis    BPH (benign prostatic hyperplasia)    Complication of anesthesia    Pt. stated he had a reaction that ended in him requiring urinary cath placement   Diverticulosis    GERD (gastroesophageal reflux disease)    esophageal spasms   Glaucoma    Gout    Head injury, closed, with concussion     Hepatitis C    chronic - Has been treated with Harvoni   HLD (hyperlipidemia)    statin intolerant (Crestor & Simvastatin) - Taking Livalo 1mg  / week   Hypertension    Parkinson's disease ([place])    Plantar fasciitis    right   PVD (peripheral vascular disease) ([place])    With no claudication; only mild abdominal aortic atherosclerosis noted on ultrasound.   Spinal stenosis of lumbar region    Thoracic ascending aortic aneurysm    4.2 cm ascending TAA 09/2016 CT, 1 yr f/u rec   Ulcer    Past Surgical History:  Procedure Laterality Date   BUNIONECTOMY WITH WEIL OSTEOTOMY Right 11/02/2019   Procedure: Right Foot Lapidus, Modified McBride Bunionectomy,  Hallux Akin Osteotomy;  Surgeon: Wylene Simmer, MD;  Location: [facility];  Service: Orthopedics;  Laterality: Right;   CARDIAC CATHETERIZATION  2005   30% Cx. Dr. Melvern Banker   cataract surgery Left 11/05/2014   COLONOSCOPY     COLONOSCOPY N/A 01/09/2021   Procedure: COLONOSCOPY;  Surgeon: Ronnette Juniper, MD;  Location: [facility];  Service: Gastroenterology;  Laterality: N/A;   ESOPHAGOGASTRODUODENOSCOPY N/A 05/02/2015   Procedure: ESOPHAGOGASTRODUODENOSCOPY (EGD);  Surgeon: Ronald Lobo, MD;  Location: [facility];  Service: Endoscopy;  Laterality: N/A;   ESOPHAGOGASTRODUODENOSCOPY  05/02/2015   no source of pt chest pain endoscopically evident. small hiatal hernia.   ESOPHAGOGASTRODUODENOSCOPY (EGD) WITH PROPOFOL N/A 01/02/2021   Procedure: ESOPHAGOGASTRODUODENOSCOPY (EGD) WITH PROPOFOL;  Surgeon: Clarene Essex, MD;  Location: [facility];  Service: Endoscopy;  Laterality: N/A;   EYE SURGERY     HARDWARE REMOVAL Right 11/02/2019   Procedure: Second Metatarsal Removal of Deep Implant and Rotational Osteotomy;  Surgeon: Wylene Simmer, MD;  Location: [facility];  Service: Orthopedics;  Laterality: Right;   IR ANGIOGRAM SELECTIVE EACH ADDITIONAL VESSEL  01/04/2021   IR ANGIOGRAM SELECTIVE EACH ADDITIONAL VESSEL   01/04/2021   IR ANGIOGRAM VISCERAL SELECTIVE  01/04/2021   IR US GUIDE VASC ACCESS RIGHT  01/04/2021   MEMBRANE PEEL Left 03/14/2014   Procedure: MEMBRANE PEEL; ENDOLASER;  Surgeon: Hurman Horn, MD;  Location: [facility];  Service: Ophthalmology;  Laterality: Left;   NM MYOVIEW LTD  2011   Neg Ischemia or infarct.   NM MYOVIEW LTD  10/2015   LOW RISK. Small, fixed basal lateral defect - likely diaphragmatic attenuation. EF 69%   PARS PLANA VITRECTOMY Left 03/14/2014   Procedure: PARS PLANA VITRECTOMY WITH 25 GAUGE;  Surgeon: Hurman Horn, MD;  Location: [facility];  Service: Ophthalmology;  Laterality: Left;   TONSILLECTOMY  TRANSTHORACIC ECHOCARDIOGRAM  01/07/2021   Normal EF 60 to 65%.  No R WMA.  GRII DD-moderately dilated LA..  Mildly dilated RV but normal function.  Normal RAP/CVP.  Trivial AI with mild to moderate sclerosis-no stenosis   UPPER GASTROINTESTINAL ENDOSCOPY     WEIL OSTEOTOMY Right 09/01/2017   Procedure: RIGHT GREAT TOE CHEVRON AND WEIL OSTEOTOMY 2ND METATARSAL;  Surgeon: Newt Minion, MD;  Location: [facility];  Service: Orthopedics;  Laterality: Right;    FAMILY HISTORY Family History  Problem Relation Age of Onset   Heart disease Mother    Cancer Paternal Grandfather    Cancer Sister     SOCIAL HISTORY Social History   Tobacco Use   Smoking status: Former    Types: Cigars    Quit date: 07/27/1988    Years since quitting: 32.9   Smokeless tobacco: Never  Vaping Use   Vaping Use: Never used  Substance Use Topics   Alcohol use: Yes    Alcohol/week: 3.0 standard drinks    Types: 1 Glasses of wine, 1 Cans of beer, 1 Shots of liquor per week    Comment: occasional   Drug use: No         OPHTHALMIC EXAM:  Base Eye Exam     Visual Acuity (ETDRS)       Right Left   Dist cc CF@4ft  20/20    Correction: Glasses         Tonometry (Tonopen, 10:56 AM)       Right Left   Pressure 13 17         Pupils       Dark Light Shape React APD   Right 6 6  Irregular Minimal +1   Left 4 3 Round Slow None         Visual Fields       Left Right    Full    Restrictions  Partial outer superior nasal deficiency         Extraocular Movement       Right Left    Full, Ortho Full, Ortho         Neuro/Psych     Oriented x3: Yes   Mood/Affect: Normal         Dilation     Both eyes: 1.0% Mydriacyl, 2.5% Phenylephrine @ 10:56 AM           Slit Lamp and Fundus Exam     External Exam       Right Left   External Normal Normal         Slit Lamp Exam       Right Left   Lids/Lashes Normal Normal   Conjunctiva/Sclera White and quiet White and quiet   Cornea Slight thickening, no corneal touch with tube, mostly clear Clear   Anterior Chamber Tube, Tube with  NO corneal touch, mild corneal thickening, mild corneal edema no change not visual acuity limiting Deep and quiet   Iris Round and reactive Round and reactive   Lens Anterior chamber intraocular lens Posterior chamber intraocular lens   Anterior Vitreous Normal Normal         Fundus Exam       Right Left   Posterior Vitreous Vitrectomized, clear Vitrectomized   Disc 2+ Pallor temporal 1+ Pallor temporal   C/D Ratio 0.75 0.55   Macula Diffuse atrophy, no macular thickening Microaneurysms, no macular thickening   Vessels Old, RVO old vasculitis now inactive Old macular BRVO, inactive  Periphery Good PRP Good PRP sector, no retinal holes or tears            IMAGING AND PROCEDURES  Imaging and Procedures for 06/23/21  OCT, Retina - OU - Both Eyes       Right Eye Quality was good. Scan locations included subfoveal. Central Foveal Thickness: 205. Findings include outer retinal atrophy, central retinal atrophy.   Left Eye Quality was good. Scan locations included subfoveal. Central Foveal Thickness: 379. Progression has been stable.   Notes Diffuse macular atrophy right eye no interval change  Mild diffuse macular thickening left eye with no active  CME.  No change in foveal elevation over the last 2 years.  Minor CME superonasal, but this is old and involutional and unchanged for years with no encroachment to center of the fovea     Color Fundus Photography Optos - OU - Both Eyes       Right Eye Progression has been stable.   Notes OD with cloudy media secondary to corneal opacity, striae from corneal edema  Retina attached, optic nerve atrophy good PRP peripheral   OS, mild temporal optic atrophy. 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, no active macular edema             ASSESSMENT/PLAN:  Primary open angle glaucoma of left eye, mild stage Add topical latanoprost 1 drop left eye bedtime     ICD-10-CM   1. 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 Primary open angle glaucoma of left eye, mild stage  H40.1121       1.  No progression no worsening of central retinal thickening in the macula.  Findings there are residual from previous disease which is controlled with compatible with 20/20 vision.  2.  Clinical examination and color fundus photography however confirms there is mild temporal atrophy to the optic nerve even with normal intraocular pressures will therefore be aggressive and add another glaucoma medication, pressure lowering drops, latanoprost use 1 drop left eye at bedtime  3.  Ophthalmic Meds Ordered this visit:  Meds ordered this encounter  Medications   latanoprost (XALATAN) 0.005 % ophthalmic solution    Sig: Place 1 drop into the left eye daily.    Dispense:  2.5 mL    Refill:  8        Return in about 5 weeks (around 07/28/2021) for IOP check with Dr. Zadie Rhine, NO DILATE.  There are no Patient Instructions on file for this visit.   Explained the diagnoses, plan, and follow up with the patient and they expressed understanding.  Patient expressed understanding of the  importance of proper follow up care.   Clent Demark Kemo Spruce M.D. Diseases & Surgery of the Retina and Vitreous [facility] [place] 06/23/21     Abbreviations: M myopia (nearsighted); A astigmatism; H hyperopia (farsighted); P presbyopia; Mrx spectacle prescription;  CTL contact lenses; OD right eye; OS left eye; OU both eyes  XT exotropia; ET esotropia; PEK punctate epithelial keratitis; PEE punctate epithelial erosions; DES dry eye syndrome; MGD meibomian gland dysfunction; ATs artificial tears; PFAT's preservative free artificial tears; [place] nuclear sclerotic cataract; PSC posterior subcapsular cataract; ERM epi-retinal membrane; PVD posterior vitreous detachment; RD retinal detachment; DM diabetes mellitus; DR diabetic retinopathy; NPDR non-proliferative diabetic retinopathy; PDR proliferative diabetic retinopathy; CSME clinically significant macular edema; DME  diabetic macular edema; dbh dot blot hemorrhages; CWS cotton wool spot; POAG primary open angle glaucoma; C/D cup-to-disc ratio; HVF humphrey visual field; GVF goldmann visual field; OCT optical coherence tomography; IOP intraocular pressure; BRVO Branch retinal vein occlusion; CRVO central retinal vein occlusion; CRAO central retinal artery occlusion; BRAO branch retinal artery occlusion; RT retinal tear; SB scleral buckle; PPV pars plana vitrectomy; VH Vitreous hemorrhage; PRP panretinal laser photocoagulation; IVK intravitreal kenalog; VMT vitreomacular traction; MH Macular hole;  NVD neovascularization of the disc; NVE neovascularization elsewhere; AREDS age related eye disease study; ARMD age related macular degeneration; POAG primary open angle glaucoma; EBMD epithelial/anterior basement membrane dystrophy; ACIOL anterior chamber intraocular lens; IOL intraocular lens; PCIOL posterior chamber intraocular lens; Phaco/IOL phacoemulsification with intraocular lens placement; [place] photorefractive keratectomy; LASIK laser assisted in situ  keratomileusis; HTN hypertension; DM diabetes mellitus; COPD chronic obstructive pulmonary disease

## 2021-06-24 ENCOUNTER — Ambulatory Visit: Payer: Self-pay | Admitting: Orthopedic Surgery

## 2021-06-24 DIAGNOSIS — Z01818 Encounter for other preprocedural examination: Secondary | ICD-10-CM

## 2021-06-26 ENCOUNTER — Ambulatory Visit: Payer: Self-pay | Admitting: Orthopedic Surgery

## 2021-06-26 DIAGNOSIS — M545 Low back pain, unspecified: Secondary | ICD-10-CM | POA: Diagnosis not present

## 2021-06-26 NOTE — H&P (Signed)
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 He is scheduled for L3-L5 decomp with in situ fusion.   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   Secondary corneal edema of right eye 12/12/2019   Ptosis of right eyelid 12/12/2019   OAB (overactive bladder) 11/29/2019   Bunion of great toe of right foot 07/14/2017   Claw toe, acquired, right 07/14/2017   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   Medication management 10/09/2016   Chest pain at rest 07/04/2015   Plantar fasciitis of right foot 02/19/2015   Depression 08/14/2014   Family history of heart disease in male family member before age 47 04/26/2014   Mild cognitive impairment 03/13/2014   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 stated he had a reaction that ended in him requiring urinary cath placement   Diverticulosis    GERD (gastroesophageal reflux disease)    esophageal spasms   Glaucoma    Gout    Head injury, closed, with concussion    Hepatitis C    chronic - Has been treated  with Harvoni   HLD (hyperlipidemia)    statin intolerant (Crestor & Simvastatin) - Taking Livalo 1mg  / week   Hypertension    Parkinson's disease ([place])    Plantar fasciitis    right   PVD (peripheral vascular disease) ([place])    With no claudication; only mild abdominal aortic atherosclerosis noted on ultrasound.   Spinal stenosis of lumbar region    Thoracic ascending aortic aneurysm    4.2 cm ascending TAA 09/2016 CT, 1 yr f/u rec   Ulcer     Past Surgical History:  Procedure Laterality Date   BUNIONECTOMY WITH WEIL OSTEOTOMY Right 11/02/2019   Procedure: Right Foot Lapidus, Modified McBride Bunionectomy,  Hallux Akin Osteotomy;  Surgeon: Wylene Simmer, MD;  Location: [facility];  Service: Orthopedics;  Laterality: Right;   CARDIAC CATHETERIZATION  2005   30% Cx. Dr. Melvern Banker   cataract surgery Left 11/05/2014   COLONOSCOPY     COLONOSCOPY N/A 01/09/2021   Procedure: COLONOSCOPY;  Surgeon: Ronnette Juniper, MD;  Location: [facility];  Service: Gastroenterology;  Laterality: N/A;   ESOPHAGOGASTRODUODENOSCOPY N/A 05/02/2015   Procedure: ESOPHAGOGASTRODUODENOSCOPY (EGD);  Surgeon: Ronald Lobo, MD;  Location: [facility];  Service: Endoscopy;  Laterality: N/A;   ESOPHAGOGASTRODUODENOSCOPY  05/02/2015   no source of pt chest pain endoscopically evident. small hiatal hernia.   ESOPHAGOGASTRODUODENOSCOPY (EGD) WITH PROPOFOL N/A 01/02/2021   Procedure: ESOPHAGOGASTRODUODENOSCOPY (EGD) WITH PROPOFOL;  Surgeon: Clarene Essex, MD;  Location: [facility];  Service: Endoscopy;  Laterality:  N/A;   EYE SURGERY     HARDWARE REMOVAL Right 11/02/2019   Procedure: Second Metatarsal Removal of Deep Implant and Rotational Osteotomy;  Surgeon: Wylene Simmer, MD;  Location: [facility];  Service: Orthopedics;  Laterality: Right;   IR ANGIOGRAM SELECTIVE EACH ADDITIONAL VESSEL  01/04/2021   IR ANGIOGRAM SELECTIVE EACH ADDITIONAL VESSEL  01/04/2021   IR ANGIOGRAM VISCERAL SELECTIVE   01/04/2021   IR US GUIDE VASC ACCESS RIGHT  01/04/2021   MEMBRANE PEEL Left 03/14/2014   Procedure: MEMBRANE PEEL; ENDOLASER;  Surgeon: Hurman Horn, MD;  Location: [facility];  Service: Ophthalmology;  Laterality: Left;   NM MYOVIEW LTD  2011   Neg Ischemia or infarct.   NM MYOVIEW LTD  10/2015   LOW RISK. Small, fixed basal lateral defect - likely diaphragmatic attenuation. EF 69%   PARS PLANA VITRECTOMY Left 03/14/2014   Procedure: PARS PLANA VITRECTOMY WITH 25 GAUGE;  Surgeon: Hurman Horn, MD;  Location: [facility];  Service: Ophthalmology;  Laterality: Left;   TONSILLECTOMY     TRANSTHORACIC ECHOCARDIOGRAM  01/07/2021   Normal EF 60 to 65%.  No R WMA.  GRII DD-moderately dilated LA..  Mildly dilated RV but normal function.  Normal RAP/CVP.  Trivial AI with mild to moderate sclerosis-no stenosis   UPPER GASTROINTESTINAL ENDOSCOPY     WEIL OSTEOTOMY Right 09/01/2017   Procedure: RIGHT GREAT TOE CHEVRON AND WEIL OSTEOTOMY 2ND METATARSAL;  Surgeon: Newt Minion, MD;  Location: [facility];  Service: Orthopedics;  Laterality: Right;    Current Outpatient Medications  Medication Sig Dispense Refill Last Dose   latanoprost (XALATAN) 0.005 % ophthalmic solution Place 1 drop into the left eye daily. 2.5 mL 8    acetaminophen (TYLENOL) 500 MG tablet Take 500 mg by mouth every 6 (six) hours as needed for mild pain, fever or headache.      alfuzosin (UROXATRAL) 10 MG 24 hr tablet TAKE 1 TABLET BY MOUTH ONCE A DAY 90 tablet 3    carbidopa-levodopa (SINEMET IR) 25-100 MG tablet TAKE 1 AND 1/2 TABLETS BY MOUTH THREE TIMES DAILY 405 tablet 1    citalopram (CELEXA) 20 MG tablet Take 1 tablet (20 mg total) by mouth daily. 30 tablet 1    docusate sodium (COLACE) 100 MG capsule Take 100 mg by mouth daily.      FIBER ADULT GUMMIES PO Take 1 tablet by mouth in the morning, at noon, and at bedtime.      gabapentin (NEURONTIN) 300 MG capsule Take 1 capsule by mouth daily at bedtime (Patient not taking: Reported on  05/13/2021) 30 capsule 0    HYDROcodone-acetaminophen (NORCO) 10-325 MG tablet Take 1 tablet by mouth 4 times a day as needed. 60 tablet 0    irbesartan (AVAPRO) 75 MG tablet Take 1 tablet (75 mg total) by mouth every morning. 90 tablet 1    L-Methylfolate-B12-B6-B2 (CEREFOLIN) 12-25-48-5 MG TABS Take 1 tablet by mouth daily. 90 tablet 3    magnesium oxide (MAG-OX) 400 MG tablet Take 400 mg by mouth 2 (two) times daily.      meloxicam (MOBIC) 15 MG tablet Take 1 tablet by mouth every day with a meal for 14 days. (Patient not taking: Reported on 06/25/2021) 14 tablet 0    Multiple Vitamin (MULTIVITAMIN WITH MINERALS) TABS tablet Take 1 tablet by mouth daily.      nitroGLYCERIN (NITROSTAT) 0.4 MG SL tablet Place 1 tablet (0.4 mg total) under the tongue every 5 (five) minutes  as needed for chest pain. 25 tablet 2    oxyCODONE (OXY IR/ROXICODONE) 5 MG immediate release tablet Take 1 tablet by mouth twice a day as needed. (Patient not taking: Reported on 05/13/2021) 15 tablet 0    pantoprazole (PROTONIX) 40 MG tablet Take 1 tablet (40 mg total) by mouth 2 (two) times daily. 180 tablet 0    predniSONE (DELTASONE) 10 MG tablet Take 6 tablets by mouth on day 1, then 5 tabs on day 2, then 4 tabs on day 3, then 3 tabs on day 4, then 2 tabs on day 5, then 1 tab on day 6. (Patient not taking: Reported on 05/13/2021) 21 tablet 0    simvastatin (ZOCOR) 40 MG tablet TAKE 1 TABLET BY MOUTH AT BEDTIME 90 tablet 3    timolol (TIMOPTIC) 0.5 % ophthalmic solution Place 1 drop into both eyes daily.      trihexyphenidyl (ARTANE) 2 MG tablet Take 1/2 tablet (1 mg total) by mouth 2 (two) times daily with a meal. 30 tablet 5    verapamil (CALAN-SR) 120 MG CR tablet Take 1 tablet (120 mg total) by mouth daily. 90 tablet 1    VITAMIN D PO Take 1 capsule by mouth daily.      No current facility-administered medications for this visit.   No Known Allergies  Social History   Tobacco Use   Smoking status: Former    Types:  Cigars    Quit date: 07/27/1988    Years since quitting: 32.9   Smokeless tobacco: Never  Substance Use Topics   Alcohol use: Yes    Alcohol/week: 3.0 standard drinks    Types: 1 Glasses of wine, 1 Cans of beer, 1 Shots of liquor per week    Comment: occasional    Family History  Problem Relation Age of Onset   Heart disease Mother    Cancer Paternal Grandfather    Cancer Sister     Review of Systems Pertinent items are noted in HPI.  Objective:   Vitals: Ht: 5 ft 10.5 in 06/26/2021 01:29 pm Wt: 160 lbs 06/26/2021 01:30 pm BMI: 22.6 06/26/2021 01:30 pm BP: 128/72 06/26/2021 01:32 pm  Clinical exam: Patient is alert and oriented x3. No shortness of breath, chest pain. Heart: RRR, no rubs, murmers, or gallops Lungs: CTAB Abdomen: Soft and nontender. No loss of bowel bladder control. He does have some midline tenderness with a small umbilical hernia. BSx4 Lumbar spine: Mild midline back pain. Significant neuropathic pain starting in the gluteal region radiating down into the hamstring when he extends or rotates the back. Relief of neuropathic leg pain with forward flexion of the lumbar spine. Neuro: Positive lower extremity dysesthesias bilaterally. Significant radicular leg pain bilaterally in the lower extremity. Negative Hoffman test, negative Babinski test.  The MRI from 11/13/2020 clearly shows lumbar spinal stenosis L3-5 with degenerative lumbar disc disease L5-S1. He does have a slight anterior listhesis at L4-5. 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 The MRI from 11/13/2020 clearly shows lumbar spinal stenosis L3-5 with degenerative lumbar disc disease L5-S1. He does have a slight anterior listhesis at L4-5. At this point time I believe the lumbar spinal stenosis is the primary source of his pain as his major complaint is the neurogenic claudication that he  suffered.  Plan:   Since the MRI is more than 6 months old we will obtain a new 1. Provided there is no significant  change then I would recommend a lumbar decompression from L3-5 with an in situ fusion at L4-5. I have reviewed the surgery with the patient and his wife and all their questions were addressed. They have expressed understanding of the risks as well as willingness to move forward with surgery.  Risks and benefits of decompression/discectomy: Infection, bleeding, death, stroke, paralysis, ongoing or worse pain, need for additional surgery, leak of spinal fluid, adjacent segment degeneration requiring additional surgery, post-operative hematoma formation that can result in neurological compromise and the need for urgent/emergent re-operation. Loss in bowel and bladder control. Injury to major vessels that could result in the need for urgent abdominal surgery to stop bleeding. Risk of deep venous thrombosis (DVT) and the need for additional treatment. Recurrent disc herniation resulting in the need for revision surgery, which could include fusion surgery (utilizing instrumentation such as pedicle screws and intervertebral cages). Additional risk: If instrumentation is used there is a risk of migration, or breakage of that hardware that could require additional surgery.  We have gone preoperative medical clearance from the patient's primary care provider, Cardiologist, and neurologist.  I have reviewed the patient's medication list with him. He is not on any blood thinners. Not using any aspirin. He is on ibuprofen which he knows to hold 7 days prior to surgery. He will also hold his vitamin 7 days prior to surgery.  He is scheduled for LSO brace fitting with PT  We have also discussed the post-operative recovery period to include: bathing/showering restrictions, wound healing, activity (and driving) restrictions, medications/pain mangement.  We have also discussed post-operative redflags to  include: signs and symptoms of postoperative infection, DVT/PE.  Patient was provided a copy of discharge instructions. These were reviewed with him. [name]

## 2021-06-26 NOTE — H&P (Deleted)
  The note originally documented on this encounter has been moved the the encounter in which it belongs.  

## 2021-06-27 NOTE — Progress Notes (Signed)
Surgical Instructions    Your procedure is scheduled on 07/03/21.  Report to [facility] Main Entrance "A" at 10:52 A.M., then check in with the Admitting office.  Call this number if you have problems the morning of surgery:  [phone]   If you have any questions prior to your surgery date call [phone]: Open Monday-Friday 8am-4pm    Remember:  Do not eat after midnight the night before your surgery  You may drink clear liquids until 09:52 the morning of your surgery.   Clear liquids allowed are: Water, Non-Citrus Juices (without pulp), Carbonated Beverages, Clear Tea, Black Coffee ONLY (NO MILK, CREAM OR POWDERED CREAMER of any kind), and Gatorade    Take these medicines the morning of surgery with A SIP OF WATER  alfuzosin  carbidopa-levodopa (SINEMET IR)  citalopram (CELEXA)  latanoprost (XALATAN) 0.005 % ophthalmic solution pantoprazole (PROTONIX simvastatin (ZOCOR) timolol (TIMOPTIC) 0.5 % ophthalmic solution trihexyphenidyl (ARTANE)  As of today, STOP taking any Aspirin (unless otherwise instructed by your surgeon) Aleve, Naproxen, Ibuprofen, Motrin, Advil, Goody's, BC's, all herbal medications, fish oil, and all vitamins.   After your COVID test   You are not required to quarantine however you are required to wear a well-fitting mask when you are out and around people not in your household.  If your mask becomes wet or soiled, replace with a new one.  Wash your hands often with soap and water for 20 seconds or clean your hands with an alcohol-based hand sanitizer that contains at least 60% alcohol.  Do not share personal items.  Notify your provider: if you are in close contact with someone who has COVID  or if you develop a fever of 100.4 or greater, sneezing, cough, sore throat, shortness of breath or body aches.           Do not wear jewelry or makeup Do not wear lotions, powders, perfumes/colognes, or deodorant. Do not shave 48 hours prior to surgery.   Men may shave face and neck. Do not bring valuables to the hospital. DO Not wear nail polish, gel polish, artificial nails, or any other type of covering on natural nails including finger and toenails. If patients have artificial nails, gel coating, etc. that need to be removed by a nail salon, please have this removed prior to surgery or surgery may need to be canceled/delayed if the surgeon/ anesthesia feels like the patient is unable to be adequately monitored.             [facility] is not responsible for any belongings or valuables.  Do NOT Smoke (Tobacco/Vaping)  24 hours prior to your procedure  If you use a CPAP at night, you may bring your mask for your overnight stay.   Contacts, glasses, hearing aids, dentures or partials may not be worn into surgery, please bring cases for these belongings   For patients admitted to the hospital, discharge time will be determined by your treatment team.   Patients discharged the day of surgery will not be allowed to drive home, and someone needs to stay with them for 24 hours.  NO VISITORS WILL BE ALLOWED IN PRE-OP WHERE PATIENTS ARE PREPPED FOR SURGERY.  ONLY 1 SUPPORT PERSON MAY BE PRESENT IN THE WAITING ROOM WHILE YOU ARE IN SURGERY.  IF YOU ARE TO BE ADMITTED, ONCE YOU ARE IN YOUR ROOM YOU WILL BE ALLOWED TWO (2) VISITORS. 1 (ONE) VISITOR MAY STAY OVERNIGHT BUT MUST ARRIVE TO THE ROOM BY 8pm.  Minor  children may have two parents present. Special consideration for safety and communication needs will be reviewed on a case by case basis.  Special instructions:    Oral Hygiene is also important to reduce your risk of infection.  Remember - BRUSH YOUR TEETH THE MORNING OF SURGERY WITH YOUR REGULAR TOOTHPASTE   [facility]- Preparing For Surgery  Before surgery, you can play an important role. Because skin is not sterile, your skin needs to be as free of germs as possible. You can reduce the number of germs on your skin by washing with CHG  (chlorahexidine gluconate) Soap before surgery.  CHG is an antiseptic cleaner which kills germs and bonds with the skin to continue killing germs even after washing.     Please do not use if you have an allergy to CHG or antibacterial soaps. If your skin becomes reddened/irritated stop using the CHG.  Do not shave (including legs and underarms) for at least 48 hours prior to first CHG shower. It is OK to shave your face.  Please follow these instructions carefully.     Shower the NIGHT BEFORE SURGERY and the MORNING OF SURGERY with CHG Soap.   If you chose to wash your hair, wash your hair first as usual with your normal shampoo. After you shampoo, rinse your hair and body thoroughly to remove the shampoo.  Then [employer] and genitals (private parts) with your normal soap and rinse thoroughly to remove soap.  After that Use CHG Soap as you would any other liquid soap. You can apply CHG directly to the skin and wash gently with a scrungie or a clean washcloth.   Apply the CHG Soap to your body ONLY FROM THE NECK DOWN.  Do not use on open wounds or open sores. Avoid contact with your eyes, ears, mouth and genitals (private parts). Wash Face and genitals (private parts)  with your normal soap.   Wash thoroughly, paying special attention to the area where your surgery will be performed.  Thoroughly rinse your body with warm water from the neck down.  DO NOT shower/wash with your normal soap after using and rinsing off the CHG Soap.  Pat yourself dry with a CLEAN TOWEL.  Wear CLEAN PAJAMAS to bed the night before surgery  Place CLEAN SHEETS on your bed the night before your surgery  DO NOT SLEEP WITH PETS.   Day of Surgery:  Take a shower with CHG soap. Wear Clean/Comfortable clothing the morning of surgery Do not apply any deodorants/lotions.   Remember to brush your teeth WITH YOUR REGULAR TOOTHPASTE.   Please read over the following fact sheets that you were given.

## 2021-06-30 ENCOUNTER — Encounter (HOSPITAL_COMMUNITY)
Admission: RE | Admit: 2021-06-30 | Discharge: 2021-06-30 | Disposition: A | Discharge status: Home / Self Care | Payer: Medicare Other | Source: Ambulatory Visit | Attending: Orthopedic Surgery | Admitting: Orthopedic Surgery

## 2021-06-30 ENCOUNTER — Encounter (HOSPITAL_COMMUNITY): Payer: Self-pay

## 2021-06-30 ENCOUNTER — Other Ambulatory Visit: Payer: Self-pay

## 2021-06-30 VITALS — BP 158/90 | HR 60 | Temp 97.5°F | Resp 18 | Ht 70.0 in | Wt 155.4 lb

## 2021-06-30 DIAGNOSIS — Z87891 Personal history of nicotine dependence: Secondary | ICD-10-CM | POA: Insufficient documentation

## 2021-06-30 DIAGNOSIS — Z20822 Contact with and (suspected) exposure to covid-19: Secondary | ICD-10-CM | POA: Diagnosis not present

## 2021-06-30 DIAGNOSIS — I10 Essential (primary) hypertension: Secondary | ICD-10-CM | POA: Diagnosis not present

## 2021-06-30 DIAGNOSIS — H401121 Primary open-angle glaucoma, left eye, mild stage: Secondary | ICD-10-CM | POA: Diagnosis not present

## 2021-06-30 DIAGNOSIS — M48062 Spinal stenosis, lumbar region with neurogenic claudication: Secondary | ICD-10-CM | POA: Diagnosis not present

## 2021-06-30 DIAGNOSIS — G20 Parkinson's disease: Secondary | ICD-10-CM | POA: Insufficient documentation

## 2021-06-30 DIAGNOSIS — H409 Unspecified glaucoma: Secondary | ICD-10-CM | POA: Insufficient documentation

## 2021-06-30 DIAGNOSIS — M5451 Vertebrogenic low back pain: Secondary | ICD-10-CM | POA: Diagnosis not present

## 2021-06-30 DIAGNOSIS — K219 Gastro-esophageal reflux disease without esophagitis: Secondary | ICD-10-CM | POA: Insufficient documentation

## 2021-06-30 DIAGNOSIS — N9989 Other postprocedural complications and disorders of genitourinary system: Secondary | ICD-10-CM | POA: Insufficient documentation

## 2021-06-30 DIAGNOSIS — N400 Enlarged prostate without lower urinary tract symptoms: Secondary | ICD-10-CM | POA: Diagnosis present

## 2021-06-30 DIAGNOSIS — M1990 Unspecified osteoarthritis, unspecified site: Secondary | ICD-10-CM | POA: Diagnosis not present

## 2021-06-30 DIAGNOSIS — Z01812 Encounter for preprocedural laboratory examination: Secondary | ICD-10-CM | POA: Insufficient documentation

## 2021-06-30 DIAGNOSIS — Z87828 Personal history of other (healed) physical injury and trauma: Secondary | ICD-10-CM | POA: Insufficient documentation

## 2021-06-30 DIAGNOSIS — E785 Hyperlipidemia, unspecified: Secondary | ICD-10-CM | POA: Insufficient documentation

## 2021-06-30 DIAGNOSIS — D649 Anemia, unspecified: Secondary | ICD-10-CM | POA: Diagnosis not present

## 2021-06-30 DIAGNOSIS — M659 Synovitis and tenosynovitis, unspecified: Secondary | ICD-10-CM | POA: Diagnosis not present

## 2021-06-30 DIAGNOSIS — Z9181 History of falling: Secondary | ICD-10-CM | POA: Insufficient documentation

## 2021-06-30 DIAGNOSIS — I739 Peripheral vascular disease, unspecified: Secondary | ICD-10-CM | POA: Diagnosis not present

## 2021-06-30 DIAGNOSIS — G3184 Mild cognitive impairment, so stated: Secondary | ICD-10-CM | POA: Diagnosis not present

## 2021-06-30 DIAGNOSIS — I2510 Atherosclerotic heart disease of native coronary artery without angina pectoris: Secondary | ICD-10-CM | POA: Insufficient documentation

## 2021-06-30 DIAGNOSIS — Z01818 Encounter for other preprocedural examination: Secondary | ICD-10-CM

## 2021-06-30 DIAGNOSIS — Z8619 Personal history of other infectious and parasitic diseases: Secondary | ICD-10-CM | POA: Insufficient documentation

## 2021-06-30 DIAGNOSIS — M4316 Spondylolisthesis, lumbar region: Secondary | ICD-10-CM | POA: Diagnosis not present

## 2021-06-30 DIAGNOSIS — M5136 Other intervertebral disc degeneration, lumbar region: Secondary | ICD-10-CM | POA: Diagnosis not present

## 2021-06-30 DIAGNOSIS — Z79899 Other long term (current) drug therapy: Secondary | ICD-10-CM | POA: Diagnosis not present

## 2021-06-30 DIAGNOSIS — H4051X0 Glaucoma secondary to other eye disorders, right eye, stage unspecified: Secondary | ICD-10-CM | POA: Diagnosis not present

## 2021-06-30 DIAGNOSIS — I7121 Aneurysm of the ascending aorta, without rupture: Secondary | ICD-10-CM | POA: Diagnosis not present

## 2021-06-30 DIAGNOSIS — F32A Depression, unspecified: Secondary | ICD-10-CM | POA: Diagnosis not present

## 2021-06-30 DIAGNOSIS — I700 Atherosclerosis of aorta: Secondary | ICD-10-CM | POA: Diagnosis not present

## 2021-06-30 DIAGNOSIS — Z9889 Other specified postprocedural states: Secondary | ICD-10-CM | POA: Diagnosis not present

## 2021-06-30 LAB — CBC
HCT: 39 % (ref 39.0–52.0)
Hemoglobin: 12.4 g/dL — ABNORMAL LOW (ref 13.0–17.0)
MCH: 28.6 pg (ref 26.0–34.0)
MCHC: 31.8 g/dL (ref 30.0–36.0)
MCV: 90.1 fL (ref 80.0–100.0)
Platelets: 256 10*3/uL (ref 150–400)
RBC: 4.33 MIL/uL (ref 4.22–5.81)
RDW: 16.1 % — ABNORMAL HIGH (ref 11.5–15.5)
WBC: 4.1 10*3/uL (ref 4.0–10.5)
nRBC: 0 % (ref 0.0–0.2)

## 2021-06-30 LAB — COMPREHENSIVE METABOLIC PANEL
ALT: 28 U/L (ref 0–44)
AST: 24 U/L (ref 15–41)
Albumin: 4.3 g/dL (ref 3.5–5.0)
Alkaline Phosphatase: 63 U/L (ref 38–126)
Anion gap: 2 — ABNORMAL LOW (ref 5–15)
BUN: 18 mg/dL (ref 8–23)
CO2: 28 mmol/L (ref 22–32)
Calcium: 9.3 mg/dL (ref 8.9–10.3)
Chloride: 104 mmol/L (ref 98–111)
Creatinine, Ser: 1.08 mg/dL (ref 0.61–1.24)
GFR, Estimated: >60 mL/min (ref >60)
Glucose, Bld: 92 mg/dL (ref 70–99)
Potassium: 4.3 mmol/L (ref 3.5–5.1)
Sodium: 134 mmol/L — ABNORMAL LOW (ref 135–145)
Total Bilirubin: 0.4 mg/dL (ref 0.3–1.2)
Total Protein: 7.5 g/dL (ref 6.5–8.1)

## 2021-06-30 LAB — TYPE AND SCREEN
ABO/RH(D): B POS
Antibody Screen: NEGATIVE

## 2021-06-30 LAB — URINALYSIS, ROUTINE W REFLEX MICROSCOPIC
Bilirubin Urine: NEGATIVE
Glucose, UA: NEGATIVE mg/dL
Hgb urine dipstick: NEGATIVE
Ketones, ur: NEGATIVE mg/dL
Leukocytes,Ua: NEGATIVE
Nitrite: NEGATIVE
Protein, ur: NEGATIVE mg/dL
Specific Gravity, Urine: 1.01 (ref 1.005–1.030)
pH: 6 (ref 5.0–8.0)

## 2021-06-30 LAB — SURGICAL PCR SCREEN
MRSA, PCR: NEGATIVE
Staphylococcus aureus: NEGATIVE

## 2021-06-30 NOTE — Progress Notes (Addendum)
PCP - Jill Alexanders, MD Cardiologist - Glenetta Hew, MD  PPM/ICD - denies Device Orders - n/a Rep Notified - n/a  Chest x-ray - n/a EKG - 04/07/2021 Stress Test - 11/22/2015 ECHO - 01/20/2012 Cardiac Cath - 01/03/2004  Sleep Study - denies CPAP - n/a  Fasting Blood Sugar - n/a  Blood Thinner Instructions: n/a  Aspirin Instructions: Patient was instructed: As of today, STOP taking any Aspirin (unless otherwise instructed by your surgeon) Aleve, Naproxen, Ibuprofen, Motrin, Advil, Goody's, BC's, all herbal medications, fish oil, and all vitamins  ERAS Protcol - yes PRE-SURGERY Ensure or G2- no  COVID TEST- done in PAT on 06/30/2021   Anesthesia review: yes - MD order; cardiac clearance; anesthesia complication  Patient denies shortness of breath, fever, cough and chest pain at PAT appointment   All instructions explained to the patient, with a verbal understanding of the material. Patient agrees to go over the instructions while at home for a better understanding. Patient also instructed to self quarantine after being tested for COVID-19. The opportunity to ask questions was provided.

## 2021-07-01 DIAGNOSIS — M5451 Vertebrogenic low back pain: Secondary | ICD-10-CM | POA: Diagnosis not present

## 2021-07-01 LAB — SARS CORONAVIRUS 2 (TAT 6-24 HRS): SARS Coronavirus 2: NEGATIVE

## 2021-07-01 NOTE — Anesthesia Preprocedure Evaluation (Addendum)
Anesthesia Evaluation  Patient identified by MRN, date of birth, ID band Patient awake    Reviewed: Allergy & Precautions, H&P , NPO status , Patient's Chart, lab work & pertinent test results  Airway Mallampati: II   Neck ROM: full    Dental   Pulmonary former smoker,    breath sounds clear to auscultation       Cardiovascular hypertension, + Peripheral Vascular Disease   Rhythm:regular Rate:Normal     Neuro/Psych PSYCHIATRIC DISORDERS Depression    GI/Hepatic GERD  ,  Endo/Other    Renal/GU      Musculoskeletal  (+) Arthritis ,   Abdominal   Peds  Hematology   Anesthesia Other Findings   Reproductive/Obstetrics                            Anesthesia Physical Anesthesia Plan  ASA: 2  Anesthesia Plan: General   Post-op Pain Management:    Induction: Intravenous  PONV Risk Score and Plan: 2 and Ondansetron, Dexamethasone and Treatment may vary due to age or medical condition  Airway Management Planned: Oral ETT  Additional Equipment:   Intra-op Plan:   Post-operative Plan: Extubation in OR  Informed Consent: I have reviewed the patients History and Physical, chart, labs and discussed the procedure including the risks, benefits and alternatives for the proposed anesthesia with the patient or authorized representative who has indicated his/her understanding and acceptance.     Dental advisory given  Plan Discussed with: CRNA, Surgeon and Anesthesiologist  Anesthesia Plan Comments: (PAT note written 07/01/2021 by Myra Gianotti, PA-C. )       Anesthesia Quick Evaluation

## 2021-07-01 NOTE — Progress Notes (Signed)
Anesthesia Chart Review:  Case: [id number] Date/Time: 07/03/21 1237   Procedure: L3-5 decompression, insitu fusion L4-5   Anesthesia type: General   Pre-op diagnosis: Lumbar spinal stenosis with neurogenic claudication   Location: [facility] ROOM 04 / [facility]   Surgeons: Melina Schools, MD       DISCUSSION: Patient is a 70 year old male scheduled for the above procedure.  History includes former smoker (quit 07/27/88), HTN, PVD, HLD, hepatitis C (s/p Harvoni treatment), closed head injury/concussion (fall down stairs onto concrete ~ 2014), Parkinsonian tremor, GERD, GI Bleed (11/2003 antral ulcer; 12/2020 diverticular bleed), glaucoma, minor CAD (cath 2005), dilated ascending thoracic aorta (4.2 cm 09/2016 CT; aortic root 3.60 cm and ascending aorta 3.10 cm 12/2020 echo), post-operative urinary retention (following 2015 vitrectomy, required foley catheter and out-patient urology follow-up).   [place] admission 01/02/21-01/16/21 for dark stools with bright red blood and dizziness. H/H 9.1/27.9. Guaiac positive. GI consulted. No source of bleeding on EGD. Had recurrent bleeding 01/04/21 with hypotension and syncope. HGB 9.5->7.9. Transfused total of 4 units during hospitalization CTA abd/pelvis showed active diverticular bleed in the ascending colon. IR consulted but unsuccessful attempt at attempt to isolate bleeding source/embolization. Colonoscopy 01/09/21 showed old clot, but no active bleeding. Hemoglobin stabilized. Cardiology also saw during hospitalization due to abnormal EKG (V3-6 T wave inversion, transient ST elevation likely representing vasospasm) with some chest pain, not clear if related to esophageal spasm. Troponins minimally elevated. LVEF normal. Given active bleeding, not a candidate for ischemic evaluation but could consider further evaluation if persistent symptoms following treatment of anemia. Neurology/psychiatry  also saw during admission due to acute encephalopathy/agitation.  Medications  adjusted, and mentation improved. HHPT arranged. GI follow-up in 2 week.  Preoperative cardiology input outlined on 06/13/2021 by Coletta Memos, NP, "Given past medical history and time since last visit, based on ACC/AHA guidelines, [name] would be at acceptable risk for the planned procedure without further cardiovascular testing."   Orthopedic and neurology documentation also indicates that he has medical and neurology clearance for surgery. 06/30/21 H/H stable, improved up to 12.4/39.0.  06/30/2021 presurgical COVID-19 test negative.  Anesthesia team to evaluate on the day of surgery.   VS: BP (!) 158/90 Comment: manual  Pulse 60   Temp (!) 36.4 C (Oral)   Resp 18   Ht 5\' 10"  (1.778 m)   Wt 70.5 kg   SpO2 100%   BMI 22.30 kg/m    PROVIDERS: Denita Lung, MD is PCP. Last visit 05/13/21.  Glenetta Hew, MD is cardiologist.  Last evaluation 04/07/2021.  Continue risk factor modification planned since CV symptoms resolved following treatment of anemia/GI bleed. Ronald Lobo, MD is GI - Antony Contras, MD is neurologist. Last visit 04/07/21 with Frann Rider, NP for follow-up parkinsonian tremor and mild cognitive impairment.  106-month follow-up planned. Deloria Lair, MD is retinal specialist - Donette Larry, MD is general surgeon. Evaluated 8/65/78 for umbilical and small supraumbilical hernias.  At that time his back issues were more symptomatic, so unless worsening symptoms related to his hernia, follow-up is planned after his back surgery and will consider surgical management at that time.   LABS: Labs reviewed: Acceptable for surgery. (all labs ordered are listed, but only abnormal results are displayed)  Labs Reviewed  CBC - Abnormal; Notable for the following components:      Result Value   Hemoglobin 12.4 (*)    RDW 16.1 (*)    All other components within normal limits  COMPREHENSIVE METABOLIC PANEL - Abnormal; Notable for the following components:    Sodium 134 (*)    Anion gap 2 (*)    All other components within normal limits  SURGICAL PCR SCREEN  SARS CORONAVIRUS 2 (TAT 6-24 HRS)  URINALYSIS, ROUTINE W REFLEX MICROSCOPIC  TYPE AND SCREEN    OTHER: Colonoscopy 01/09/21: IMPRESSION: - Preparation of the colon was poor. - Old blood and clots in the entire examined colon. - The examined portion of the ileum was normal. - Diverticulosis in the sigmoid colon, in the descending colon, in the transverse colon, at the hepatic flexure and in the ascending colon. - Non-bleeding internal hemorrhoids. - No specimens collected.  EGD 01/02/21: IMPRESSION: - Small hiatal hernia. - Chronic gastritis. - Non-bleeding duodenal diverticulum. - Normal duodenal bulb, first portion of the duodenum and second portion of the duodenum. - The examination was otherwise normal. And no blood seen - No specimens collected.   IMAGES: NM GI Blood Loss Scan 01/12/21: IMPRESSION: Faint contrast accumulation in the right upper quadrant likely at the hepatic flexure of the colon. This is at site of prior diverticular bleed on CTA, and is concerning for persistent or recurrent bleed. No active bleeding was demonstrated on intervening catheter angiography.   CTA abd/pelvis 01/04/21: IMPRESSION: 1. Positive for active diverticular bleed in the ascending colon just proximal to the hepatic flexure. 2. Scattered atherosclerotic vascular calcifications without aneurysm, dissection or occlusion. Aortic Atherosclerosis (ICD10-I70.0). 3. Progressive lower lumbar degenerative disc disease and facet arthropathy now with mild grade 1 anterolisthesis of L4 on L5. 4. Additional ancillary findings as above without significant interval change compared to prior imaging. [See full report]   Mesenteric Angiogram 01/04/21: IMPRESSION: Status post ultrasound guided access right common femoral artery for SMA angiogram, super selective angiogram the right colic arteries  of interest in attempt to isolate the source of recent lower GI hemorrhage. No embolization was performed.   MRI L-spine 11/13/20 ([facility]): Impression: 1.  L3-4 moderate to moderate severe central canal stenosis slightly greater left lateral canal deformity with left L4 lateral canal contact if not impingement and moderate by foraminal stenosis. 2.  L4-5 moderate to moderate severe central canal and lateral recess stenosis with probable bilateral L5 nerve root impingement and moderate severe left foraminal stenosis, greater on the right with probable right L4 nerve root impingement with mild left L4 foraminal contact. 3.  L5-S1 moderate by foraminal stenosis.   EKG: 04/07/21: Normal sinus rhythm Cannot rule out inferior infarct, age undetermined Anterior septal infarct, age undetermined   CV: Echo 01/07/21: IMPRESSIONS   1. Left ventricular ejection fraction, by estimation, is 60 to 65%. The  left ventricle has normal function. The left ventricle has no regional  wall motion abnormalities. Left ventricular diastolic parameters are  consistent with Grade II diastolic  dysfunction (pseudonormalization).   2. Right ventricular systolic function is normal. The right ventricular  size is mildly enlarged. Tricuspid regurgitation signal is inadequate for  assessing PA pressure.   3. Left atrial size was moderately dilated.   4. The mitral valve is normal in structure. No evidence of mitral valve  regurgitation. No evidence of mitral stenosis.   5. The aortic valve is tricuspid. Aortic valve regurgitation is trivial.  Mild to moderate aortic valve sclerosis/calcification is present, without  any evidence of aortic stenosis.   6. The inferior vena cava is normal in size with greater than 50%  respiratory variability, suggesting right atrial pressure of 3 mmHg.  US Carotid 01/01/21: Summary:  - Right Carotid: Velocities in the right ICA are consistent with a 1-39% stenosis.  - Left  Carotid: Velocities in the left ICA are consistent with a 1-39% stenosis.  - Vertebrals:  Bilateral vertebral arteries demonstrate antegrade flow. Small caliber right vertebral artery.  - Subclavians: Right subclavian artery flow was disturbed. Normal flow hemodynamics were seen in the left subclavian artery.    Nuclear stress test 11/22/15: Nuclear stress EF: 69%. Blood pressure demonstrated a hypertensive response to exercise. Upsloping ST segment depression ST segment depression of 1 mm was noted during stress in the V5, V6, aVF, III and II leads, and returning to baseline after less than 1 minute of recovery. Defect 1: There is a small defect of mild severity present in the basal anterolateral location. The study is normal. This is a low risk study.  Low risk stress nuclear study with a small fixed basal lateral defect, likely representing artifact. Otherwise normal perfusion and normal left ventricular regional and global systolic function.     Cardiac cath 01/03/04:  1.  Angiographically patent coronary artery (trivial early lesion in circumflex, not significant at this time). [30% focal area of liminal irregularity in the mid CX] 2.  Normal LV systolic function. EF 60-70%.  3.  Normal renal arteries.    Past Medical History:  Diagnosis Date   Allergy    Arthritis    BPH (benign prostatic hyperplasia)    Complication of anesthesia    Pt. stated he had a reaction that ended in him requiring urinary cath placement   Diverticulosis    GERD (gastroesophageal reflux disease)    esophageal spasms   Glaucoma    Gout    Head injury, closed, with concussion    Hepatitis C    chronic - Has been treated with Harvoni   HLD (hyperlipidemia)    statin intolerant (Crestor & Simvastatin) - Taking Livalo 1mg  / week   Hypertension    Parkinson's disease ([place])    Plantar fasciitis    right   PVD (peripheral vascular disease) ([place])    With no claudication; only mild abdominal aortic  atherosclerosis noted on ultrasound.   Spinal stenosis of lumbar region    Thoracic ascending aortic aneurysm    4.2 cm ascending TAA 09/2016 CT, 1 yr f/u rec   Ulcer     Past Surgical History:  Procedure Laterality Date   BUNIONECTOMY WITH WEIL OSTEOTOMY Right 11/02/2019   Procedure: Right Foot Lapidus, Modified McBride Bunionectomy,  Hallux Akin Osteotomy;  Surgeon: Wylene Simmer, MD;  Location: [place];  Service: Orthopedics;  Laterality: Right;   CARDIAC CATHETERIZATION  2005   30% Cx. Dr. Melvern Banker   cataract surgery Left 11/05/2014   COLONOSCOPY     COLONOSCOPY N/A 01/09/2021   Procedure: COLONOSCOPY;  Surgeon: Ronnette Juniper, MD;  Location: [facility];  Service: Gastroenterology;  Laterality: N/A;   ESOPHAGOGASTRODUODENOSCOPY N/A 05/02/2015   Procedure: ESOPHAGOGASTRODUODENOSCOPY (EGD);  Surgeon: Ronald Lobo, MD;  Location: [facility];  Service: Endoscopy;  Laterality: N/A;   ESOPHAGOGASTRODUODENOSCOPY  05/02/2015   no source of pt chest pain endoscopically evident. small hiatal hernia.   ESOPHAGOGASTRODUODENOSCOPY (EGD) WITH PROPOFOL N/A 01/02/2021   Procedure: ESOPHAGOGASTRODUODENOSCOPY (EGD) WITH PROPOFOL;  Surgeon: Clarene Essex, MD;  Location: [facility];  Service: Endoscopy;  Laterality: N/A;   EYE SURGERY     HARDWARE REMOVAL Right 11/02/2019   Procedure: Second Metatarsal Removal of Deep Implant and Rotational Osteotomy;  Surgeon: Wylene Simmer,  MD;  Location: [place];  Service: Orthopedics;  Laterality: Right;   IR ANGIOGRAM SELECTIVE EACH ADDITIONAL VESSEL  01/04/2021   IR ANGIOGRAM SELECTIVE EACH ADDITIONAL VESSEL  01/04/2021   IR ANGIOGRAM VISCERAL SELECTIVE  01/04/2021   IR US GUIDE VASC ACCESS RIGHT  01/04/2021   MEMBRANE PEEL Left 03/14/2014   Procedure: MEMBRANE PEEL; ENDOLASER;  Surgeon: Hurman Horn, MD;  Location: [place];  Service: Ophthalmology;  Laterality: Left;   NM MYOVIEW LTD  2011   Neg Ischemia or infarct.   NM  MYOVIEW LTD  10/2015   LOW RISK. Small, fixed basal lateral defect - likely diaphragmatic attenuation. EF 69%   PARS PLANA VITRECTOMY Left 03/14/2014   Procedure: PARS PLANA VITRECTOMY WITH 25 GAUGE;  Surgeon: Hurman Horn, MD;  Location: [place];  Service: Ophthalmology;  Laterality: Left;   TONSILLECTOMY     TRANSTHORACIC ECHOCARDIOGRAM  01/07/2021   Normal EF 60 to 65%.  No R WMA.  GRII DD-moderately dilated LA..  Mildly dilated RV but normal function.  Normal RAP/CVP.  Trivial AI with mild to moderate sclerosis-no stenosis   UPPER GASTROINTESTINAL ENDOSCOPY     WEIL OSTEOTOMY Right 09/01/2017   Procedure: RIGHT GREAT TOE CHEVRON AND WEIL OSTEOTOMY 2ND METATARSAL;  Surgeon: Newt Minion, MD;  Location: [place];  Service: Orthopedics;  Laterality: Right;    MEDICATIONS:  acetaminophen (TYLENOL) 500 MG tablet   alfuzosin (UROXATRAL) 10 MG 24 hr tablet   carbidopa-levodopa (SINEMET IR) 25-100 MG tablet   citalopram (CELEXA) 20 MG tablet   docusate sodium (COLACE) 100 MG capsule   FIBER ADULT GUMMIES PO   gabapentin (NEURONTIN) 300 MG capsule   HYDROcodone-acetaminophen (NORCO) 10-325 MG tablet   irbesartan (AVAPRO) 75 MG tablet   L-Methylfolate-B12-B6-B2 (CEREFOLIN) 12-25-48-5 MG TABS   latanoprost (XALATAN) 0.005 % ophthalmic solution   magnesium oxide (MAG-OX) 400 MG tablet   meloxicam (MOBIC) 15 MG tablet   Multiple Vitamin (MULTIVITAMIN WITH MINERALS) TABS tablet   nitroGLYCERIN (NITROSTAT) 0.4 MG SL tablet   oxyCODONE (OXY IR/ROXICODONE) 5 MG immediate release tablet   pantoprazole (PROTONIX) 40 MG tablet   predniSONE (DELTASONE) 10 MG tablet   simvastatin (ZOCOR) 40 MG tablet   timolol (TIMOPTIC) 0.5 % ophthalmic solution   trihexyphenidyl (ARTANE) 2 MG tablet   verapamil (CALAN-SR) 120 MG CR tablet   VITAMIN D PO   No current facility-administered medications for this encounter.   Not currently taking Neurontin, prednisone taper, or Mobic.   Myra Gianotti,  PA-C Surgical Short Stay/Anesthesiology [facility] Phone [phone] [facility] [place] Phone [phone] 07/01/2021 1:13 PM

## 2021-07-03 ENCOUNTER — Encounter (HOSPITAL_COMMUNITY): Payer: Self-pay | Admitting: Orthopedic Surgery

## 2021-07-03 ENCOUNTER — Ambulatory Visit (HOSPITAL_COMMUNITY): Payer: Medicare Other | Admitting: Certified Registered Nurse Anesthetist

## 2021-07-03 ENCOUNTER — Ambulatory Visit (HOSPITAL_COMMUNITY): Payer: Medicare Other | Admitting: Vascular Surgery

## 2021-07-03 ENCOUNTER — Ambulatory Visit (HOSPITAL_COMMUNITY): Payer: Medicare Other

## 2021-07-03 ENCOUNTER — Encounter (HOSPITAL_COMMUNITY)
Admission: AD | Disposition: A | Discharge status: Home / Self Care | Payer: Self-pay | Source: Ambulatory Visit | Attending: Orthopedic Surgery

## 2021-07-03 ENCOUNTER — Inpatient Hospital Stay (HOSPITAL_COMMUNITY)
Admission: AD | Admit: 2021-07-03 | Discharge: 2021-07-04 | DRG: 460 | Disposition: A | Discharge status: Home / Self Care | Payer: Medicare Other | Source: Ambulatory Visit | Attending: Orthopedic Surgery | Admitting: Orthopedic Surgery

## 2021-07-03 DIAGNOSIS — H401121 Primary open-angle glaucoma, left eye, mild stage: Secondary | ICD-10-CM | POA: Diagnosis present

## 2021-07-03 DIAGNOSIS — M659 Synovitis and tenosynovitis, unspecified: Secondary | ICD-10-CM | POA: Diagnosis present

## 2021-07-03 DIAGNOSIS — N400 Enlarged prostate without lower urinary tract symptoms: Secondary | ICD-10-CM | POA: Diagnosis present

## 2021-07-03 DIAGNOSIS — E785 Hyperlipidemia, unspecified: Secondary | ICD-10-CM | POA: Diagnosis present

## 2021-07-03 DIAGNOSIS — F32A Depression, unspecified: Secondary | ICD-10-CM | POA: Diagnosis present

## 2021-07-03 DIAGNOSIS — G3184 Mild cognitive impairment, so stated: Secondary | ICD-10-CM | POA: Diagnosis present

## 2021-07-03 DIAGNOSIS — M48062 Spinal stenosis, lumbar region with neurogenic claudication: Secondary | ICD-10-CM | POA: Diagnosis present

## 2021-07-03 DIAGNOSIS — Z419 Encounter for procedure for purposes other than remedying health state, unspecified: Secondary | ICD-10-CM

## 2021-07-03 DIAGNOSIS — M1990 Unspecified osteoarthritis, unspecified site: Secondary | ICD-10-CM | POA: Diagnosis present

## 2021-07-03 DIAGNOSIS — Z87891 Personal history of nicotine dependence: Secondary | ICD-10-CM | POA: Diagnosis not present

## 2021-07-03 DIAGNOSIS — Z9889 Other specified postprocedural states: Secondary | ICD-10-CM | POA: Diagnosis not present

## 2021-07-03 DIAGNOSIS — D649 Anemia, unspecified: Secondary | ICD-10-CM | POA: Diagnosis present

## 2021-07-03 DIAGNOSIS — M4800 Spinal stenosis, site unspecified: Secondary | ICD-10-CM | POA: Diagnosis present

## 2021-07-03 DIAGNOSIS — G20 Parkinson's disease: Secondary | ICD-10-CM | POA: Diagnosis present

## 2021-07-03 DIAGNOSIS — K219 Gastro-esophageal reflux disease without esophagitis: Secondary | ICD-10-CM | POA: Diagnosis present

## 2021-07-03 DIAGNOSIS — I10 Essential (primary) hypertension: Secondary | ICD-10-CM | POA: Diagnosis present

## 2021-07-03 DIAGNOSIS — I7121 Aneurysm of the ascending aorta, without rupture: Secondary | ICD-10-CM | POA: Diagnosis present

## 2021-07-03 DIAGNOSIS — M4316 Spondylolisthesis, lumbar region: Secondary | ICD-10-CM | POA: Diagnosis present

## 2021-07-03 DIAGNOSIS — I739 Peripheral vascular disease, unspecified: Secondary | ICD-10-CM | POA: Diagnosis present

## 2021-07-03 DIAGNOSIS — H4051X0 Glaucoma secondary to other eye disorders, right eye, stage unspecified: Secondary | ICD-10-CM | POA: Diagnosis present

## 2021-07-03 DIAGNOSIS — I700 Atherosclerosis of aorta: Secondary | ICD-10-CM | POA: Diagnosis present

## 2021-07-03 DIAGNOSIS — Z79899 Other long term (current) drug therapy: Secondary | ICD-10-CM

## 2021-07-03 DIAGNOSIS — Z20822 Contact with and (suspected) exposure to covid-19: Secondary | ICD-10-CM | POA: Diagnosis present

## 2021-07-03 DIAGNOSIS — M5136 Other intervertebral disc degeneration, lumbar region: Secondary | ICD-10-CM | POA: Diagnosis present

## 2021-07-03 HISTORY — PX: LUMBAR LAMINECTOMY/DECOMPRESSION MICRODISCECTOMY: SHX5026

## 2021-07-03 SURGERY — LUMBAR LAMINECTOMY/DECOMPRESSION MICRODISCECTOMY 2 LEVELS
Anesthesia: General | Site: Spine Lumbar

## 2021-07-03 MED ORDER — TRANEXAMIC ACID-NACL 1000-0.7 MG/100ML-% IV SOLN
INTRAVENOUS | Status: AC
  Filled 2021-07-03: qty 100

## 2021-07-03 MED ORDER — HYDROMORPHONE HCL 1 MG/ML IJ SOLN: 0.5000 mg | INTRAMUSCULAR | Status: AC | PRN

## 2021-07-03 MED ORDER — TRANEXAMIC ACID-NACL 1000-0.7 MG/100ML-% IV SOLN
INTRAVENOUS | Status: DC | PRN
  Administered 2021-07-03: 1000 mg via INTRAVENOUS

## 2021-07-03 MED ORDER — TRIHEXYPHENIDYL HCL 2 MG PO TABS
1.0000 mg | ORAL_TABLET | Freq: Two times a day (BID) | ORAL | Status: DC
  Administered 2021-07-04: 1 mg via ORAL
  Filled 2021-07-03 (×2): qty 1

## 2021-07-03 MED ORDER — FENTANYL CITRATE (PF) 250 MCG/5ML IJ SOLN
INTRAMUSCULAR | Status: DC | PRN
  Administered 2021-07-03 (×4): 50 ug via INTRAVENOUS

## 2021-07-03 MED ORDER — CHLORHEXIDINE GLUCONATE 0.12 % MT SOLN
15.0000 mL | Freq: Once | OROMUCOSAL | Status: AC
  Administered 2021-07-03: 15 mL via OROMUCOSAL
  Filled 2021-07-03: qty 15

## 2021-07-03 MED ORDER — ORAL CARE MOUTH RINSE: 15.0000 mL | Freq: Once | OROMUCOSAL | Status: AC

## 2021-07-03 MED ORDER — CEFAZOLIN SODIUM-DEXTROSE 2-4 GM/100ML-% IV SOLN
2.0000 g | INTRAVENOUS | Status: AC
  Administered 2021-07-03: 2 g via INTRAVENOUS
  Filled 2021-07-03: qty 100

## 2021-07-03 MED ORDER — BUPIVACAINE-EPINEPHRINE (PF) 0.25% -1:200000 IJ SOLN
INTRAMUSCULAR | Status: DC | PRN
  Administered 2021-07-03: 10 mL

## 2021-07-03 MED ORDER — PHENOL 1.4 % MT LIQD: 1.0000 | OROMUCOSAL | Status: DC | PRN

## 2021-07-03 MED ORDER — SODIUM CHLORIDE 0.9 % IV SOLN: 250.0000 mL | INTRAVENOUS | Status: DC

## 2021-07-03 MED ORDER — PROPOFOL 10 MG/ML IV BOLUS
INTRAVENOUS | Status: DC | PRN
  Administered 2021-07-03: 100 mg via INTRAVENOUS

## 2021-07-03 MED ORDER — MIDAZOLAM HCL 2 MG/2ML IJ SOLN
INTRAMUSCULAR | Status: AC
  Filled 2021-07-03: qty 2

## 2021-07-03 MED ORDER — METHOCARBAMOL 500 MG PO TABS
500.0000 mg | ORAL_TABLET | Freq: Three times a day (TID) | ORAL | 0 refills | Status: AC | PRN
  Filled 2021-07-04: qty 15, 5d supply, fill #0

## 2021-07-03 MED ORDER — ACETAMINOPHEN 500 MG PO TABS
500.0000 mg | ORAL_TABLET | Freq: Four times a day (QID) | ORAL | Status: DC | PRN
  Administered 2021-07-04: 500 mg via ORAL
  Filled 2021-07-03: qty 1

## 2021-07-03 MED ORDER — MEPERIDINE HCL 25 MG/ML IJ SOLN: 6.2500 mg | INTRAMUSCULAR | Status: DC | PRN

## 2021-07-03 MED ORDER — METHOCARBAMOL 1000 MG/10ML IJ SOLN
500.0000 mg | Freq: Four times a day (QID) | INTRAVENOUS | Status: DC | PRN
  Filled 2021-07-03: qty 5

## 2021-07-03 MED ORDER — LACTATED RINGERS IV SOLN: INTRAVENOUS | Status: DC

## 2021-07-03 MED ORDER — HYDROMORPHONE HCL 1 MG/ML IJ SOLN
0.2500 mg | INTRAMUSCULAR | Status: DC | PRN
  Administered 2021-07-03: 0.25 mg via INTRAVENOUS

## 2021-07-03 MED ORDER — SUGAMMADEX SODIUM 200 MG/2ML IV SOLN
INTRAVENOUS | Status: DC | PRN
  Administered 2021-07-03: 100 mg via INTRAVENOUS

## 2021-07-03 MED ORDER — DOCUSATE SODIUM 100 MG PO CAPS
100.0000 mg | ORAL_CAPSULE | Freq: Every day | ORAL | Status: DC
  Filled 2021-07-03: qty 1

## 2021-07-03 MED ORDER — POLYETHYLENE GLYCOL 3350 17 G PO PACK: 17.0000 g | PACK | Freq: Every day | ORAL | Status: DC | PRN

## 2021-07-03 MED ORDER — OXYCODONE HCL 5 MG PO TABS: 10.0000 mg | ORAL_TABLET | ORAL | Status: DC | PRN

## 2021-07-03 MED ORDER — DOCUSATE SODIUM 100 MG PO CAPS
100.0000 mg | ORAL_CAPSULE | Freq: Two times a day (BID) | ORAL | Status: DC
  Administered 2021-07-03 – 2021-07-04 (×2): 100 mg via ORAL
  Filled 2021-07-03: qty 1

## 2021-07-03 MED ORDER — DEXAMETHASONE SODIUM PHOSPHATE 10 MG/ML IJ SOLN
INTRAMUSCULAR | Status: DC | PRN
  Administered 2021-07-03: 10 mg via INTRAVENOUS

## 2021-07-03 MED ORDER — PHENYLEPHRINE 40 MCG/ML (10ML) SYRINGE FOR IV PUSH (FOR BLOOD PRESSURE SUPPORT)
PREFILLED_SYRINGE | INTRAVENOUS | Status: AC
  Filled 2021-07-03: qty 10

## 2021-07-03 MED ORDER — HYDRALAZINE HCL 20 MG/ML IJ SOLN
INTRAMUSCULAR | Status: AC
  Administered 2021-07-03: 5 mg via INTRAVENOUS
  Filled 2021-07-03: qty 1

## 2021-07-03 MED ORDER — CEFAZOLIN SODIUM-DEXTROSE 1-4 GM/50ML-% IV SOLN
1.0000 g | Freq: Three times a day (TID) | INTRAVENOUS | Status: AC
  Administered 2021-07-03 – 2021-07-04 (×2): 1 g via INTRAVENOUS
  Filled 2021-07-03 (×2): qty 50

## 2021-07-03 MED ORDER — OXYCODONE HCL 5 MG/5ML PO SOLN: 5.0000 mg | Freq: Once | ORAL | Status: DC | PRN

## 2021-07-03 MED ORDER — THROMBIN 20000 UNITS EX SOLR
CUTANEOUS | Status: DC | PRN
  Administered 2021-07-03: 20 mL via TOPICAL

## 2021-07-03 MED ORDER — ROCURONIUM BROMIDE 10 MG/ML (PF) SYRINGE
PREFILLED_SYRINGE | INTRAVENOUS | Status: DC | PRN
  Administered 2021-07-03 (×2): 50 mg via INTRAVENOUS

## 2021-07-03 MED ORDER — MIDAZOLAM HCL 2 MG/2ML IJ SOLN: 0.5000 mg | Freq: Once | INTRAMUSCULAR | Status: DC | PRN

## 2021-07-03 MED ORDER — VERAPAMIL HCL ER 120 MG PO TBCR
120.0000 mg | EXTENDED_RELEASE_TABLET | Freq: Every day | ORAL | Status: DC
  Administered 2021-07-04: 120 mg via ORAL
  Filled 2021-07-03: qty 1

## 2021-07-03 MED ORDER — SODIUM CHLORIDE 0.9% FLUSH: 3.0000 mL | Freq: Two times a day (BID) | INTRAVENOUS | Status: DC

## 2021-07-03 MED ORDER — DEXAMETHASONE SODIUM PHOSPHATE 10 MG/ML IJ SOLN
INTRAMUSCULAR | Status: AC
  Filled 2021-07-03: qty 1

## 2021-07-03 MED ORDER — PHENYLEPHRINE 40 MCG/ML (10ML) SYRINGE FOR IV PUSH (FOR BLOOD PRESSURE SUPPORT)
PREFILLED_SYRINGE | INTRAVENOUS | Status: DC | PRN
  Administered 2021-07-03: 80 ug via INTRAVENOUS
  Administered 2021-07-03: 40 ug via INTRAVENOUS
  Administered 2021-07-03 (×2): 80 ug via INTRAVENOUS
  Administered 2021-07-03 (×2): 40 ug via INTRAVENOUS
  Administered 2021-07-03: 80 ug via INTRAVENOUS
  Administered 2021-07-03: 40 ug via INTRAVENOUS
  Administered 2021-07-03: 80 ug via INTRAVENOUS

## 2021-07-03 MED ORDER — ONDANSETRON HCL 4 MG/2ML IJ SOLN
INTRAMUSCULAR | Status: DC | PRN
  Administered 2021-07-03: 4 mg via INTRAVENOUS

## 2021-07-03 MED ORDER — ONDANSETRON HCL 4 MG/2ML IJ SOLN
INTRAMUSCULAR | Status: AC
  Filled 2021-07-03: qty 2

## 2021-07-03 MED ORDER — ACETAMINOPHEN 650 MG RE SUPP: 650.0000 mg | RECTAL | Status: DC | PRN

## 2021-07-03 MED ORDER — ACETAMINOPHEN 325 MG PO TABS
650.0000 mg | ORAL_TABLET | ORAL | Status: DC | PRN
  Administered 2021-07-03 – 2021-07-04 (×3): 650 mg via ORAL
  Filled 2021-07-03 (×3): qty 2

## 2021-07-03 MED ORDER — ONDANSETRON HCL 4 MG PO TABS: 4.0000 mg | ORAL_TABLET | Freq: Four times a day (QID) | ORAL | Status: DC | PRN

## 2021-07-03 MED ORDER — EPHEDRINE 5 MG/ML INJ
INTRAVENOUS | Status: AC
  Filled 2021-07-03: qty 5

## 2021-07-03 MED ORDER — LIDOCAINE 2% (20 MG/ML) 5 ML SYRINGE
INTRAMUSCULAR | Status: DC | PRN
  Administered 2021-07-03 (×2): 50 mg via INTRAVENOUS

## 2021-07-03 MED ORDER — PROPOFOL 10 MG/ML IV BOLUS
INTRAVENOUS | Status: AC
  Filled 2021-07-03: qty 20

## 2021-07-03 MED ORDER — FLEET ENEMA 7-19 GM/118ML RE ENEM: 1.0000 | ENEMA | Freq: Once | RECTAL | Status: DC | PRN

## 2021-07-03 MED ORDER — PHENYLEPHRINE HCL-NACL 20-0.9 MG/250ML-% IV SOLN
INTRAVENOUS | Status: DC | PRN
  Administered 2021-07-03: 100 ug/min via INTRAVENOUS

## 2021-07-03 MED ORDER — ROCURONIUM BROMIDE 10 MG/ML (PF) SYRINGE
PREFILLED_SYRINGE | INTRAVENOUS | Status: AC
  Filled 2021-07-03: qty 10

## 2021-07-03 MED ORDER — CARBIDOPA-LEVODOPA 25-100 MG PO TABS
1.5000 | ORAL_TABLET | Freq: Three times a day (TID) | ORAL | Status: DC
  Administered 2021-07-03 – 2021-07-04 (×3): 1.5 via ORAL
  Filled 2021-07-03 (×4): qty 1.5

## 2021-07-03 MED ORDER — HYDROMORPHONE HCL 1 MG/ML IJ SOLN
INTRAMUSCULAR | Status: AC
  Filled 2021-07-03: qty 1

## 2021-07-03 MED ORDER — ALFUZOSIN HCL ER 10 MG PO TB24
10.0000 mg | ORAL_TABLET | Freq: Every day | ORAL | Status: DC
  Administered 2021-07-04: 10 mg via ORAL
  Filled 2021-07-03: qty 1

## 2021-07-03 MED ORDER — HEMOSTATIC AGENTS (NO CHARGE) OPTIME
TOPICAL | Status: DC | PRN
  Administered 2021-07-03 (×2): 1 via TOPICAL

## 2021-07-03 MED ORDER — IRBESARTAN 150 MG PO TABS
75.0000 mg | ORAL_TABLET | Freq: Every morning | ORAL | Status: DC
  Administered 2021-07-04: 75 mg via ORAL
  Filled 2021-07-03: qty 1

## 2021-07-03 MED ORDER — CITALOPRAM HYDROBROMIDE 20 MG PO TABS
20.0000 mg | ORAL_TABLET | Freq: Every day | ORAL | Status: DC
  Administered 2021-07-04: 20 mg via ORAL
  Filled 2021-07-03: qty 1

## 2021-07-03 MED ORDER — BUPIVACAINE-EPINEPHRINE (PF) 0.25% -1:200000 IJ SOLN
INTRAMUSCULAR | Status: AC
  Filled 2021-07-03: qty 30

## 2021-07-03 MED ORDER — MIDAZOLAM HCL 2 MG/2ML IJ SOLN
INTRAMUSCULAR | Status: DC | PRN
  Administered 2021-07-03: 1 mg via INTRAVENOUS

## 2021-07-03 MED ORDER — MENTHOL 3 MG MT LOZG: 1.0000 | LOZENGE | OROMUCOSAL | Status: DC | PRN

## 2021-07-03 MED ORDER — OXYCODONE HCL 5 MG PO TABS: 5.0000 mg | ORAL_TABLET | Freq: Once | ORAL | Status: DC | PRN

## 2021-07-03 MED ORDER — 0.9 % SODIUM CHLORIDE (POUR BTL) OPTIME
TOPICAL | Status: DC | PRN
  Administered 2021-07-03: 1000 mL

## 2021-07-03 MED ORDER — HYDRALAZINE HCL 20 MG/ML IJ SOLN: 5.0000 mg | Freq: Once | INTRAMUSCULAR | Status: AC

## 2021-07-03 MED ORDER — THROMBIN 20000 UNITS EX SOLR
CUTANEOUS | Status: AC
  Filled 2021-07-03: qty 20000

## 2021-07-03 MED ORDER — OXYCODONE HCL 5 MG PO TABS
5.0000 mg | ORAL_TABLET | ORAL | Status: DC | PRN
  Administered 2021-07-03: 5 mg via ORAL
  Filled 2021-07-03: qty 1

## 2021-07-03 MED ORDER — EPHEDRINE SULFATE-NACL 50-0.9 MG/10ML-% IV SOSY
PREFILLED_SYRINGE | INTRAVENOUS | Status: DC | PRN
  Administered 2021-07-03 (×2): 5 mg via INTRAVENOUS
  Administered 2021-07-03: 10 mg via INTRAVENOUS
  Administered 2021-07-03: 5 mg via INTRAVENOUS
  Administered 2021-07-03: 10 mg via INTRAVENOUS
  Administered 2021-07-03: 5 mg via INTRAVENOUS

## 2021-07-03 MED ORDER — SODIUM CHLORIDE 0.9% FLUSH: 3.0000 mL | INTRAVENOUS | Status: DC | PRN

## 2021-07-03 MED ORDER — ONDANSETRON HCL 4 MG/2ML IJ SOLN: 4.0000 mg | Freq: Four times a day (QID) | INTRAMUSCULAR | Status: DC | PRN

## 2021-07-03 MED ORDER — METHOCARBAMOL 500 MG PO TABS
500.0000 mg | ORAL_TABLET | Freq: Four times a day (QID) | ORAL | Status: DC | PRN
  Administered 2021-07-03: 500 mg via ORAL
  Filled 2021-07-03: qty 1

## 2021-07-03 MED ORDER — OXYCODONE-ACETAMINOPHEN 10-325 MG PO TABS: 1.0000 | ORAL_TABLET | Freq: Four times a day (QID) | ORAL | 0 refills | Status: AC | PRN

## 2021-07-03 MED ORDER — GABAPENTIN 300 MG PO CAPS
300.0000 mg | ORAL_CAPSULE | Freq: Three times a day (TID) | ORAL | Status: DC
  Administered 2021-07-03 – 2021-07-04 (×2): 300 mg via ORAL
  Filled 2021-07-03 (×2): qty 1

## 2021-07-03 MED ORDER — NITROGLYCERIN 0.4 MG SL SUBL: 0.4000 mg | SUBLINGUAL_TABLET | SUBLINGUAL | Status: DC | PRN

## 2021-07-03 MED ORDER — HYDRALAZINE HCL 20 MG/ML IJ SOLN
10.0000 mg | INTRAMUSCULAR | Status: AC
  Administered 2021-07-03: 10 mg via INTRAVENOUS
  Filled 2021-07-03: qty 0.5

## 2021-07-03 MED ORDER — ONDANSETRON HCL 4 MG PO TABS
4.0000 mg | ORAL_TABLET | Freq: Three times a day (TID) | ORAL | 0 refills | Status: DC | PRN
  Filled 2021-07-04: qty 20, 7d supply, fill #0

## 2021-07-03 MED ORDER — PROMETHAZINE HCL 25 MG/ML IJ SOLN
6.2500 mg | INTRAMUSCULAR | Status: DC | PRN
Start: 2021-07-03 — End: 2021-07-03

## 2021-07-03 MED ORDER — FENTANYL CITRATE (PF) 250 MCG/5ML IJ SOLN
INTRAMUSCULAR | Status: AC
  Filled 2021-07-03: qty 5

## 2021-07-03 MED ORDER — LIDOCAINE 2% (20 MG/ML) 5 ML SYRINGE
INTRAMUSCULAR | Status: AC
  Filled 2021-07-03: qty 5

## 2021-07-03 SURGICAL SUPPLY — 61 items
BAG COUNTER SPONGE SURGICOUNT (BAG) ×4 IMPLANT
BAND RUBBER #18 3X1/16 STRL (MISCELLANEOUS) IMPLANT
BUR EGG ELITE 4.0 (BURR) ×2 IMPLANT
BUR MATCHSTICK NEURO 3.0 LAGG (BURR) IMPLANT
CLSR STERI-STRIP ANTIMIC 1/2X4 (GAUZE/BANDAGES/DRESSINGS) IMPLANT
CNTNR URN SCR LID CUP LEK RST (MISCELLANEOUS) ×1 IMPLANT
CONT SPEC 4OZ STRL OR WHT (MISCELLANEOUS) ×2
CORD BIPOLAR FORCEPS 12FT (ELECTRODE) IMPLANT
DRAIN CHANNEL 15F RND FF W/TCR (WOUND CARE) ×2 IMPLANT
DRAPE MICROSCOPE LEICA 46X105 (MISCELLANEOUS) IMPLANT
DRAPE POUCH INSTRU U-SHP 10X18 (DRAPES) ×2 IMPLANT
DRAPE SURG 17X11 SM STRL (DRAPES) ×2 IMPLANT
DRAPE U-SHAPE 47X51 STRL (DRAPES) ×2 IMPLANT
DRSG OPSITE POSTOP 4X6 (GAUZE/BANDAGES/DRESSINGS) ×2 IMPLANT
DRSG TEGADERM 4X4.75 (GAUZE/BANDAGES/DRESSINGS) ×2 IMPLANT
DURAPREP 26ML APPLICATOR (WOUND CARE) ×2 IMPLANT
ELECT BLADE 4.0 EZ CLEAN MEGAD (MISCELLANEOUS) ×2
ELECT CAUTERY BLADE 6.4 (BLADE) ×2 IMPLANT
ELECT PENCIL ROCKER SW 15FT (MISCELLANEOUS) ×2 IMPLANT
ELECT REM PT RETURN 9FT ADLT (ELECTROSURGICAL) ×2
ELECTRODE BLDE 4.0 EZ CLN MEGD (MISCELLANEOUS) ×1 IMPLANT
ELECTRODE REM PT RTRN 9FT ADLT (ELECTROSURGICAL) ×1 IMPLANT
EVACUATOR SILICONE 100CC (DRAIN) ×2 IMPLANT
GLOVE SURG ENC MOIS LTX SZ6.5 (GLOVE) ×2 IMPLANT
GLOVE SURG MICRO LTX SZ8.5 (GLOVE) ×2 IMPLANT
GLOVE SURG UNDER POLY LF SZ6.5 (GLOVE) ×2 IMPLANT
GLOVE SURG UNDER POLY LF SZ8.5 (GLOVE) ×2 IMPLANT
GOWN STRL REUS W/ TWL LRG LVL3 (GOWN DISPOSABLE) ×1 IMPLANT
GOWN STRL REUS W/TWL 2XL LVL3 (GOWN DISPOSABLE) ×2 IMPLANT
GOWN STRL REUS W/TWL LRG LVL3 (GOWN DISPOSABLE) ×2
KIT BASIN OR (CUSTOM PROCEDURE TRAY) ×2 IMPLANT
MILL MEDIUM DISP (BLADE) ×2 IMPLANT
NEEDLE 22X1 1/2 (OR ONLY) (NEEDLE) ×2 IMPLANT
NEEDLE SPNL 18GX3.5 QUINCKE PK (NEEDLE) ×4 IMPLANT
NS IRRIG 1000ML POUR BTL (IV SOLUTION) ×2 IMPLANT
PACK LAMINECTOMY ORTHO (CUSTOM PROCEDURE TRAY) ×2 IMPLANT
PACK UNIVERSAL I (CUSTOM PROCEDURE TRAY) ×2 IMPLANT
PATTIES SURGICAL .5 X.5 (GAUZE/BANDAGES/DRESSINGS) ×4 IMPLANT
PATTIES SURGICAL .5 X1 (DISPOSABLE) ×2 IMPLANT
SPONGE GAUZE 2X2 8PLY STRL LF (GAUZE/BANDAGES/DRESSINGS) ×2 IMPLANT
SPONGE SURGIFOAM ABS GEL 100 (HEMOSTASIS) ×2 IMPLANT
SPONGE T-LAP 4X18 ~~LOC~~+RFID (SPONGE) ×10 IMPLANT
STAPLER VISISTAT 35W (STAPLE) ×2 IMPLANT
STRIP CLOSURE SKIN 1/2X4 (GAUZE/BANDAGES/DRESSINGS) ×2 IMPLANT
SURGIFLO W/THROMBIN 8M KIT (HEMOSTASIS) ×4 IMPLANT
SUT BONE WAX W31G (SUTURE) ×2 IMPLANT
SUT ETHILON 3 0 FSL (SUTURE) ×2 IMPLANT
SUT MNCRL AB 3-0 PS2 27 (SUTURE) ×2 IMPLANT
SUT VIC AB 1 CT1 18XCR BRD 8 (SUTURE) ×1 IMPLANT
SUT VIC AB 1 CT1 27 (SUTURE) ×2
SUT VIC AB 1 CT1 27XBRD ANTBC (SUTURE) ×1 IMPLANT
SUT VIC AB 1 CT1 8-18 (SUTURE) ×2
SUT VIC AB 2-0 CT1 18 (SUTURE) ×4 IMPLANT
SUT VICRYL 0 UR6 27IN ABS (SUTURE) ×2 IMPLANT
SYR BULB IRRIG 60ML STRL (SYRINGE) ×2 IMPLANT
SYR CONTROL 10ML LL (SYRINGE) ×2 IMPLANT
TOWEL GREEN STERILE (TOWEL DISPOSABLE) ×2 IMPLANT
TOWEL GREEN STERILE FF (TOWEL DISPOSABLE) ×2 IMPLANT
TRAY FOLEY MTR SLVR 16FR STAT (SET/KITS/TRAYS/PACK) ×2 IMPLANT
WATER STERILE IRR 1000ML POUR (IV SOLUTION) IMPLANT
YANKAUER SUCT BULB TIP NO VENT (SUCTIONS) ×2 IMPLANT

## 2021-07-03 NOTE — Op Note (Signed)
OPERATIVE REPORT  DATE OF SURGERY: 07/03/2021  PATIENT NAME:  [name] MRN: [id number] DOB: 01-May-1951  PCP: Denita Lung, MD  PRE-OPERATIVE DIAGNOSIS: Lumbar spinal stenosis L3-5 with neurogenic claudication.  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  In situ fusion L3-5.  SURGEON:  Melina Schools, MD  PHYSICIAN ASSISTANT: Nelson Chimes, PA  ANESTHESIA:   General  EBL: 878 ml   Complications: None  Graft: Autograft from decompression   BRIEF HISTORY: [name] is a 70 y.o. male who presents with significant low back buttock and bilateral neuropathic leg pain.  Clinical exam and imaging studies are consistent with lumbar spinal stenosis with neurogenic claudication.  After attempts at conservative management failed to improve his quality of life we elected to move forward with surgery.  Initially I felt as though a decompression L3-5 with in situ fusion at L4-5 would be necessary.  However given the extent of the spinal stenosis and need to do a wide decompression at L3-4 I elected to extend the in situ fusion to this level.  PROCEDURE DETAILS: Patient was brought into the operating room and was properly positioned on the operating room table.  After induction with general anesthesia the patient was endotracheally intubated.  A timeout was taken to confirm all important data: including patient, procedure, and the level. Teds, SCD's were applied.  A Foley was also inserted.  Posterior lumbar spine was prepped and draped in a standard fashion.  2 needles were placed into the wound and an x-ray was taken for localization of the incision site.  I marked out the incision site and then infiltrated with quarter percent Marcaine with epinephrine.  Sharp dissection was carried out through the skin down to the deep fascia.  The deep fascia was sharply  incised and I stripped the spinous processes of L3, L4, and L5.  I stripped down expose the lamina out to the facet complex.  The L3-4 and L4-5 facet complexes were visualized.  At this point the posterior exposure was complete.  I placed a Penfield 4 underneath the L4 lamina and took an intraoperative x-ray.  This confirmed that I was at the appropriate level.  Using a double-action Leksell rongeur I remove the inferior third of the L3 spinous process and the entire L4 spinous process.  I then developed a plane between the ligamentum flavum and the lamina and used my 3 mm Kerrison rongeur to perform a generous L4 laminotomy.  At this point I noted there was significant thickening of the ligamentum flavum.  I eventually was able to get a plane between the ligamentum flavum and the thecal sac.  Care was taken to gently mobilize the thecal sac.  Especially on the right side there was calcifications within the ligamentum flavum and significant bone spurs emanating from the medial aspect of the facet complex.  I gently dissected into the left lateral recess and remove the ligamentum flavum.  A medial facetectomy was performed and I can now see into the lateral recess.  I continued inferiorly until I was able to visualize the L5 pedicle.  I then identified the L5 nerve root and performed a foraminotomy on this left side.  At this point the left lateral recess was completely decompressed.  Epidural veins were encountered and coagulated with bipolar cautery.  I then switched sides and began gently dissected in the right lateral recess.  This was more  difficult due to the large bone spurs.  With slow steady dissection with a Penfield 4 is able to create a plane between the thecal sac and the bone spur/ligamentum flavum.  I then resected both with a Kerrison rongeur.  I then resected the medial aspect of the L5 superior facet in order to complete my lateral recess decompression.  As I did on the other side I went down and  to the L5 foramen and performed a foraminotomy and made sure I can palpate and visualize the L5 nerve root as it traveled just beyond the L5 pedicle into the foramen.  At this point I was pleased with the L4-5 lateral recess and central decompression.  At this point I performed a generous laminotomy of L3 centrally with a 3 mm Kerrison rongeur and again encountered the very thickened ligamentum flavum.  Using the same technique I gently dissected through the ligamentum flavum until I was able to visualize some of the central epidural fat.  I then resected the central ligamentum flavum.  I again gently dissected into the left lateral recess and remove the ligamentum flavum.  However there was significant stenosis encountered at the this point.  With the central decompression complete I then passed a neuro patty under the remaining portion of the L4 lamina so that I can visualize it on both sides.  With the thecal sac protected I then completed the L4 laminectomy.  With the L4 laminectomy completed I can now get into the left lateral recess with greater ease.  I again used my Penfield 4 to create the plane and used my Kerrison rongeurs to resect the ligamentum flavum as well as the medial aspect of the facet.  Once the facetectomy was performed I could visualize the L4 pedicle and perform an L4 foraminotomy.  I continued superiorly until I was able to palpate the inferior aspect of the L4 pedicle.  This confirmed that I was above where the maximum area of stenosis at L3-4 was based on the preoperative MRI.  At this point the L3, 4, and V nerve roots on the left side were completely decompressed.  I could easily pass my nerve hook out each of the foramen.  I can easily visualize into the lateral recess at L3-4 and L4-5 confirming satisfactory decompression.  I then went to the contralateral side and began working on the right side.  Using the same technique I performed a generous medial facetectomy to adequately  decompress the lateral recess and I performed foraminotomies at L3 and L4.  I again made sure I could palpate the pedicle confirming my width of the decompression was satisfactory.  Epidural veins were encountered in the lateral recess and were all coagulated with bipolar cautery.  At this point I had an adequate decompression.  I confirmed this by passing my nerve hook along the lateral recess and out each of the foramen bilaterally.  I then used a high-speed bur to decorticate the transverse process of L3, L4, and L5 as well as the lateral aspect of the L3-4 and L4-5 facet complex.  There is significant synovitis in the facet joints at L3-4 and L4-5.  As a result I remove the capsule and decorticated all of the synovitis.  I then packed the posterior lateral gutter and the facet complex with bone graft that I had harvested from the decompression.  This was done bilaterally.  At this point time the wound was copes irrigated with normal saline and made sure that hemostasis  is well electrocautery.  A deep drain was placed and taken out of assist stab incision site.  Retractors were removed and the wound was closed in a layered fashion with interrupted #1 Vicryl suture, 2-0 Vicryl suture, and 3-0 Monocryl for the skin.  A separate drain stitch was used.  Steri-Strips and a dry dressing were applied and the patient was ultimately extubated transfer the PACU without incident.  The end of the case all needle sponge counts were correct.  There were no adverse intraoperative events.  First Assistant: Nelson Chimes, PA.  She was instrumental in assisting with positioning, retraction throughout surgery, irrigation and visualization, as well as placement of the drain and wound closure. Melina Schools, MD 07/03/2021 5:11 PM

## 2021-07-03 NOTE — Transfer of Care (Signed)
Immediate Anesthesia Transfer of Care Note  Patient: JELAN BATTERTON  Procedure(s) Performed: Lumbar three through five decompression with lumbar three through five insitu fusion (Spine Lumbar)  Patient Location: PACU  Anesthesia Type:General  Level of Consciousness: drowsy and patient cooperative  Airway & Oxygen Therapy: Patient Spontanous Breathing and Patient connected to nasal cannula oxygen  Post-op Assessment: Report given to RN, Post -op Vital signs reviewed and stable and Patient moving all extremities  Post vital signs: Reviewed and stable  Last Vitals:  Vitals Value Taken Time  BP 131/84 07/03/21 1749  Temp    Pulse 77 07/03/21 1753  Resp 13 07/03/21 1753  SpO2 96 % 07/03/21 1753  Vitals shown include unvalidated device data.  Last Pain:  Vitals:   07/03/21 1117  TempSrc:   PainSc: 0-No pain      Patients Stated Pain Goal: 0 (04/24/56 4734)  Complications: No notable events documented.

## 2021-07-03 NOTE — H&P (Signed)
Addendum H&P: Patient continues to have significant back buttock and bilateral lower extremity consistent with neurogenic claudication.  Despite appropriate conservative management his quality of life continues to deteriorate.  There has been no change in his clinical exam since his last office visit of 06/26/2021.  Plan on moving forward with a two-level lumbar decompression with in situ fusion.  I reviewed the risks, benefits, and alternatives with the patient and he is expressed understanding as well as willingness to move forward with surgery.

## 2021-07-03 NOTE — Anesthesia Postprocedure Evaluation (Signed)
Anesthesia Post Note  Patient: [name]  Procedure(s) Performed: Lumbar three through five decompression with lumbar three through five insitu fusion (Spine Lumbar)     Patient location during evaluation: PACU Anesthesia Type: General Level of consciousness: awake Pain management: pain level controlled Respiratory status: spontaneous breathing Cardiovascular status: stable Postop Assessment: no apparent nausea or vomiting Anesthetic complications: no   No notable events documented.  Last Vitals:  Vitals:   07/03/21 1248 07/03/21 1316  BP: (!) 208/108 (!) 169/99  Pulse: 70 68  Resp:    Temp:    SpO2:      Last Pain:  Vitals:   07/03/21 1117  TempSrc:   PainSc: 0-No pain                 Graziella Connery

## 2021-07-03 NOTE — Discharge Instructions (Signed)
Laminectomy, Care After This sheet gives you information about how to care for yourself after your procedure. Your health care provider may also give you more specific instructions. If you have problems or questions, contact your health care provider. What can I expect after the procedure? After the procedure, it is common to have: Some pain around your incision area. Muscle tightening (spasms) across the back.   Follow these instructions at home: Incision care Follow instructions from your health care provider about how to take care of your incision area. Make sure you: Wash your hands with soap and water before and after you apply medicine to the area or change your bandage (dressing). If soap and water are not available, use hand sanitizer. Change your dressing as told by your health care provider. Leave stitches (sutures), skin glue, or adhesive strips in place. These skin closures may need to stay in place for 2 weeks or longer. If adhesive strip edges start to loosen and curl up, you may trim the loose edges. Do not remove adhesive strips completely unless your health care provider tells you to do that.  Check your incision area every day for signs of infection. Check for: More redness, swelling, or pain. More fluid or blood. Warmth. Pus or a bad smell. Medicines Take over-the-counter and prescription medicines only as told by your health care provider. If you were prescribed an antibiotic medicine, use it as told by your health care provider. Do not stop using the antibiotic even if you start to feel better. If needed, call office in 3 days to request refill of pain medications. Bathing Do not take baths, swim, or use a hot tub for 6 weeks, or until your incision has healed completely. If your health care provider approves, you may take showers after your dressing has been removed. Ok to shower in 5 days Activity Return to your normal activities as told by your health care  provider. Ask your health care provider what activities are safe for you. Avoid bending or twisting at your waist. Always bend at your knees. Do not sit for more than 20-30 minutes at a time. Lie down or walk between periods of sitting. Do not lift anything that is heavier than 10 lb (4.5 kg) or the limit that your health care provider tells you, until he or she says that it is safe. Do not drive for 2 weeks after your procedure or for as long as your health care provider tells you.  Do not drive or use heavy machinery while taking prescription pain medicine. General instructions To prevent or treat constipation while you are taking prescription pain medicine, your health care provider may recommend that you: Drink enough fluid to keep your urine clear or pale yellow. Take over-the-counter or prescription medicines. Eat foods that are high in fiber, such as fresh fruits and vegetables, whole grains, and beans. Limit foods that are high in fat and processed sugars, such as fried and sweet foods. Do breathing exercises as told. Keep all follow-up visits as told by your health care provider. This is important. Contact a health care provider if: You have more redness, swelling, or pain around your incision area. Your incision feels warm to the touch. You are not able to return to activities or do exercises as told by your health care provider. Get help right away if: You have: More fluid or blood coming from your incision area. Pus or a bad smell coming from your incision area. Chills or a fever.   Episodes of dizziness or fainting while standing. You develop a rash. You develop shortness of breath or you have difficulty breathing. You cannot control when you urinate or have a bowel movement. You become weak. You are not able to use your legs. Summary After the procedure, it is common to have some pain around your incision area. You may also have muscle tightening (spasms) across the  back. Follow instructions from your health care provider about how to care for your incision. Do not lift anything that is heavier than 10 lb (4.5 kg) or the limit that your health care provider tells you, until he or she says that it is safe. Contact your health care provider if you have more redness, swelling, or pain around your incision area or if your incision feels warm to the touch. These can be signs of infection. This information is not intended to replace advice given to you by your health care provider. Make sure you discuss any questions you have with your health care provider. Refer to this sheet in the next few weeks. These instructions provide you with information about caring for yourself after your procedure. Your health care provider may also give you more specific instructions. Your treatment has been planned according to current medical practices, but problems sometimes occur. Call your health care provider if you have any problems or questions after your procedure. What can I expect after the procedure? It is common to have pain for the first few days after the procedure. Some people continue to have mild pain even after making a full recovery. Follow these instructions at home: Medicine Take medicines only as directed by your health care provider. Avoid taking over-the-counter pain medicines unless your health care provider tells you otherwise. These medicines interfere with the development and growth of new bone cells. If you were prescribed a narcotic pain medicine, take it exactly as told by your health care provider. Do not drink alcohol while on the medicine. Do not drive while on the medicine. Injury care Care for your back brace as told by your health care provider. If directed, apply ice to the injured area: Put ice in a plastic bag. Place a towel between your skin and the bag. Leave the ice on for 20 minutes, 2-3 times a day. Activity Perform physical therapy  exercises as told by your health care provider. Exercise regularly. Start by taking short walks. Slowly increase your activity level over time. Gentle exercise helps to ease pain. Sit, stand, walk, turn in bed, and reposition yourself as told by your health care provider. This will help to keep your spine in proper alignment. Avoid bending and twisting your body. Avoid doing strenuous household chores, such as vacuuming. Do not lift anything that is heavier than 10 lb (4.5 kg). Other Instructions Keep all follow-up visits as directed by your health care provider. This is important. Do not use any tobacco products, including cigarettes, chewing tobacco, or electronic cigarettes. If you need help quitting, ask your health care provider. Nicotine affects the way bones heal. Contact a health care provider if: Your pain gets worse. You have a fever. You have redness, swelling, or pain at the site of your incision. You have fluid, blood, or pus coming from your incision. You have numbness, tingling, or weakness in any part of your body. Get help right away if: Your incision feels swollen and tender, and the surrounding area looks like a lump. The lump may be red or bluish in color. You   cannot move any part of your body (paralysis). You cannot control your bladder or bowels. 

## 2021-07-03 NOTE — Brief Op Note (Signed)
07/03/2021  5:24 PM  PATIENT:  [name]  70 y.o. male  PRE-OPERATIVE DIAGNOSIS:  Lumbar spinal stenosis with neurogenic claudication  POST-OPERATIVE DIAGNOSIS:  Lumbar spinal stenosis with neurogenic claudication  PROCEDURE:  Procedure(s): Lumbar three through five decompression with lumbar three through five insitu fusion (N/A)  SURGEON:  Surgeon(s) and Role:    Melina Schools, MD - Primary  PHYSICIAN ASSISTANT:   ASSISTANTS: Nelson Chimes, PA  ANESTHESIA:   general  EBL:  200 mL   BLOOD ADMINISTERED:none  DRAINS: 1 drain brought out through separate stab incision.  LOCAL MEDICATIONS USED:  MARCAINE     SPECIMEN:  No Specimen  DISPOSITION OF SPECIMEN:  N/A  COUNTS:  YES  TOURNIQUET:  * No tourniquets in log *  DICTATION: .Dragon Dictation  PLAN OF CARE: Admit to inpatient   PATIENT DISPOSITION:  PACU - hemodynamically stable.

## 2021-07-03 NOTE — Anesthesia Procedure Notes (Signed)
Procedure Name: Intubation Date/Time: 07/03/2021 1:30 PM Performed by: Michele Rockers, CRNA Pre-anesthesia Checklist: Patient identified, Patient being monitored, Timeout performed, Emergency Drugs available and Suction available Patient Re-evaluated:Patient Re-evaluated prior to induction Oxygen Delivery Method: Circle System Utilized Preoxygenation: Pre-oxygenation with 100% oxygen Induction Type: IV induction Ventilation: Mask ventilation without difficulty Laryngoscope Size: Miller and 2 Grade View: Grade I Tube type: Oral Tube size: 7.5 mm Number of attempts: 1 Airway Equipment and Method: Stylet Placement Confirmation: ETT inserted through vocal cords under direct vision, positive ETCO2 and breath sounds checked- equal and bilateral Secured at: 23 cm Tube secured with: Tape Dental Injury: Teeth and Oropharynx as per pre-operative assessment

## 2021-07-04 ENCOUNTER — Other Ambulatory Visit (HOSPITAL_COMMUNITY): Payer: Self-pay

## 2021-07-04 ENCOUNTER — Encounter (HOSPITAL_COMMUNITY): Payer: Self-pay | Admitting: Orthopedic Surgery

## 2021-07-04 MED ORDER — OXYCODONE-ACETAMINOPHEN 10-325 MG PO TABS
ORAL_TABLET | ORAL | 0 refills | Status: DC
  Filled 2021-07-04: qty 20, 5d supply, fill #0

## 2021-07-04 NOTE — Discharge Summary (Signed)
Patient ID: [name] MRN: [id number] DOB/AGE: June 01, 1951 70 y.o.  Admit date: 07/03/2021 Discharge date: 07/04/2021  Admission Diagnoses:  Principal Problem:   Spinal stenosis   Discharge Diagnoses:  Principal Problem:   Spinal stenosis  status post Procedure(s): Lumbar three through five decompression with lumbar three through five insitu fusion  Past Medical History:  Diagnosis Date   Allergy    Arthritis    BPH (benign prostatic hyperplasia)    Complication of anesthesia    Pt. stated he had a reaction that ended in him requiring urinary cath placement   Diverticulosis    GERD (gastroesophageal reflux disease)    esophageal spasms   Glaucoma    Gout    Head injury, closed, with concussion    Hepatitis C    chronic - Has been treated with Harvoni   HLD (hyperlipidemia)    statin intolerant (Crestor & Simvastatin) - Taking Livalo 1mg  / week   Hypertension    Parkinson's disease ([place])    Plantar fasciitis    right   PVD (peripheral vascular disease) ([place])    With no claudication; only mild abdominal aortic atherosclerosis noted on ultrasound.   Spinal stenosis of lumbar region    Thoracic ascending aortic aneurysm    4.2 cm ascending TAA 09/2016 CT, 1 yr f/u rec   Ulcer     Surgeries: Procedure(s): Lumbar three through five decompression with lumbar three through five insitu fusion on 07/03/2021   Consultants:   Discharged Condition: Improved  Hospital Course: SHOMARI SCICCHITANO is an 70 y.o. male who was admitted 07/03/2021 for operative treatment of Spinal stenosis. Patient failed conservative treatments (please see the history and physical for the specifics) and had severe unremitting pain that affects sleep, daily activities and work/hobbies. After pre-op clearance, the patient was taken to the operating room on 07/03/2021 and underwent  Procedure(s): Lumbar three through five decompression with lumbar three through five insitu fusion.    Patient was  given perioperative antibiotics:  Anti-infectives (From admission, onward)    Start     Dose/Rate Route Frequency Ordered Stop   07/03/21 2130  ceFAZolin (ANCEF) IVPB 1 g/50 mL premix        1 g 100 mL/hr over 30 Minutes Intravenous Every 8 hours 07/03/21 1957 07/04/21 0454   07/03/21 0836  ceFAZolin (ANCEF) IVPB 2g/100 mL premix        2 g 200 mL/hr over 30 Minutes Intravenous 30 min pre-op 07/03/21 0836 07/03/21 1355        Patient was given sequential compression devices and early ambulation to prevent DVT.   Patient benefited maximally from hospital stay and there were no complications. At the time of discharge, the patient was urinating/moving their bowels without difficulty, tolerating a regular diet, pain is controlled with oral pain medications and they have been cleared by PT/OT.   Recent vital signs: Patient Vitals for the past 24 hrs:  BP Temp Temp src Pulse Resp SpO2  07/04/21 1132 (!) 128/91 98.8 F (37.1 C) Oral 90 18 99 %  07/04/21 0825 (!) 174/105 98.4 F (36.9 C) Oral 90 18 97 %  07/04/21 0437 (!) 176/108 -- -- 89 -- 100 %  07/04/21 0434 (!) 172/105 97.9 F (36.6 C) Oral 93 16 99 %  07/04/21 0000 (!) 166/104 -- -- 79 17 --  07/03/21 2357 (!) 164/106 98.2 F (36.8 C) Oral 79 -- 100 %  07/03/21 2004 120/84 97.7 F (36.5 C) Oral 74 16  96 %  07/03/21 1920 121/81 (!) 97.3 F (36.3 C) -- 76 19 99 %  07/03/21 1905 120/70 -- -- 79 13 98 %  07/03/21 1850 100/73 -- -- 91 15 97 %  07/03/21 1835 100/73 -- -- 93 20 95 %  07/03/21 1820 124/86 -- -- 98 15 98 %  07/03/21 1805 (!) 131/97 -- -- 97 19 98 %  07/03/21 1750 131/84 (!) 97.3 F (36.3 C) -- 88 13 99 %     Recent laboratory studies: No results for input(s): WBC, HGB, HCT, PLT, NA, K, CL, CO2, BUN, CREATININE, GLUCOSE, INR, CALCIUM in the last 72 hours.  Invalid input(s): PT, 2   Discharge Medications:   Allergies as of 07/04/2021   No Known Allergies      Medication List     STOP taking these  medications    acetaminophen 500 MG tablet Commonly known as: TYLENOL   FIBER ADULT GUMMIES PO   HYDROcodone-acetaminophen 10-325 MG tablet Commonly known as: Norco   magnesium oxide 400 MG tablet Commonly known as: MAG-OX   meloxicam 15 MG tablet Commonly known as: MOBIC   multivitamin with minerals Tabs tablet   oxyCODONE 5 MG immediate release tablet Commonly known as: Oxy IR/ROXICODONE   predniSONE 10 MG tablet Commonly known as: DELTASONE   VITAMIN D PO       TAKE these medications    alfuzosin 10 MG 24 hr tablet Commonly known as: UROXATRAL TAKE 1 TABLET BY MOUTH ONCE A DAY   carbidopa-levodopa 25-100 MG tablet Commonly known as: SINEMET IR TAKE 1 AND 1/2 TABLETS BY MOUTH THREE TIMES DAILY   Cerefolin 12-25-48-5 MG Tabs Take 1 tablet by mouth daily.   citalopram 20 MG tablet Commonly known as: CELEXA Take 1 tablet (20 mg total) by mouth daily.   docusate sodium 100 MG capsule Commonly known as: COLACE Take 100 mg by mouth daily.   gabapentin 300 MG capsule Commonly known as: NEURONTIN Take 1 capsule by mouth daily at bedtime   irbesartan 75 MG tablet Commonly known as: AVAPRO Take 1 tablet (75 mg total) by mouth every morning.   latanoprost 0.005 % ophthalmic solution Commonly known as: Xalatan Place 1 drop into the left eye daily.   methocarbamol 500 MG tablet Commonly known as: Robaxin Take 1 tablet (500 mg total) by mouth every 8 (eight) hours as needed for up to 5 days for muscle spasms.   nitroGLYCERIN 0.4 MG SL tablet Commonly known as: NITROSTAT Place 1 tablet (0.4 mg total) under the tongue every 5 (five) minutes as needed for chest pain.   ondansetron 4 MG tablet Commonly known as: Zofran Take 1 tablet (4 mg total) by mouth every 8 (eight) hours as needed for nausea or vomiting.   oxyCODONE-acetaminophen 10-325 MG tablet Commonly known as: Percocet Take 1 tablet by mouth every 6 (six) hours as needed for up to 5 days for pain.    pantoprazole 40 MG tablet Commonly known as: PROTONIX Take 1 tablet (40 mg total) by mouth 2 (two) times daily.   simvastatin 40 MG tablet Commonly known as: ZOCOR TAKE 1 TABLET BY MOUTH AT BEDTIME   timolol 0.5 % ophthalmic solution Commonly known as: TIMOPTIC Place 1 drop into both eyes daily.   trihexyphenidyl 2 MG tablet Commonly known as: ARTANE Take 1/2 tablet (1 mg total) by mouth 2 (two) times daily with a meal.   verapamil 120 MG CR tablet Commonly known as: CALAN-SR Take 1 tablet (120  mg total) by mouth daily.        Diagnostic Studies: DG Lumbar Spine 2-3 Views  Result Date: 07/03/2021 CLINICAL DATA:  Intraoperative localization EXAM: LUMBAR SPINE - 2-3 VIEW COMPARISON:  CT 01/04/2021 FINDINGS: Two lateral intraoperative radiographs of the lumbar spine were obtained. The image labeled "image 1" demonstrates instrumentation projecting posteriorly to the L4-5 disc level and S1 vertebral level. The image labeled "image 2" demonstrates surgical probe posterior to the L4-5 intervertebral disc level. IMPRESSION: Intraoperative radiographs as described. Surgical probe is posterior to the L4-5 intervertebral disc level on image 2. These results were called by telephone at the time of interpretation on 07/03/2021 at 2:28 pm to OR 4. Electronically Signed   By: Davina Poke D.O.   On: 07/03/2021 14:30   OCT, Retina - OU - Both Eyes  Result Date: 06/23/2021 Right Eye Quality was good. Scan locations included subfoveal. Central Foveal Thickness: 205. Findings include outer retinal atrophy, central retinal atrophy. Left Eye Quality was good. Scan locations included subfoveal. Central Foveal Thickness: 379. Progression has been stable. Notes Diffuse macular atrophy right eye no interval change Mild diffuse macular thickening left eye with no active CME.  No change in foveal elevation over the last 2 years. Minor CME superonasal, but this is old and involutional and unchanged for  years with no encroachment to center of the fovea  Color Fundus Photography Optos - OU - Both Eyes  Result Date: 06/23/2021 Right Eye Progression has been stable. Notes OD with cloudy media secondary to corneal opacity, striae from corneal edema Retina attached, optic nerve atrophy good PRP peripheral OS, mild temporal optic atrophy. 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, no active macular edema   Discharge Instructions     Incentive spirometry RT   Complete by: As directed         Follow-up Information     Melina Schools, MD. Schedule an appointment as soon as possible for a visit in 2 week(s).   Specialty: Orthopedic Surgery Why: As needed, If symptoms worsen, For suture removal, For wound re-check Contact information: [address] [place] [address] [phone]                 Discharge Plan:  discharge to home  Disposition: stable    Signed: Charlyne Petrin for [facility] PA-C [facility] [phone] 07/04/2021, 4:29 PM

## 2021-07-04 NOTE — Progress Notes (Signed)
Physical Therapy Treatment Patient Details Name: [name] MRN: [id number] DOB: 05-28-1951 Today's Date: 07/04/2021   History of Present Illness 70 yo admitted 12/8 for L3-5 spinal fusion. PMHx: Parkinson's, BPH, HTn, HLD, GIB    PT Comments    Pt's wife present for education and confirmed that she is available for 24hr supervision acutely. Pt and spouse educated for brace wear, precautions, basic transfers and wife stating understanding and ability to assist. Wife able to confirm home setup and that pt cognition it not currently at baseline after anaesthesia. Pt safe to D/C home with wife.     Recommendations for follow up therapy are one component of a multi-disciplinary discharge planning process, led by the attending physician.  Recommendations may be updated based on patient status, additional functional criteria and insurance authorization.  Follow Up Recommendations  Follow physician's recommendations for discharge plan and follow up therapies     Assistance Recommended at Discharge Frequent or constant Supervision/Assistance  Equipment Recommendations  None recommended by PT    Recommendations for Other Services       Precautions / Restrictions Precautions Precautions: Back;Fall Precaution Booklet Issued: Yes (comment) Precaution Comments: Educated wife for all precautions and brace wear Required Braces or Orthoses: Spinal Brace Spinal Brace: Lumbar corset;Applied in sitting position     Mobility  Bed Mobility Overal bed mobility: Needs Assistance Bed Mobility: Rolling;Sidelying to Sit;Sit to Sidelying Rolling: Supervision Sidelying to sit: Supervision     Sit to sidelying: Supervision General bed mobility comments: cues for sequence without physical assist with wife present and educated    Transfers Overall transfer level: Modified independent Equipment used: None                    Ambulation/Gait Ambulation/Gait assistance:  Supervision Gait Distance (Feet): 20 Feet Assistive device: None Gait Pattern/deviations: Step-through pattern;Decreased stride length   Gait velocity interpretation: <1.8 ft/sec, indicate of risk for recurrent falls   General Gait Details: pt walked 10' x 2 in room without AD   Stairs    Wheelchair Mobility    Modified Rankin (Stroke Patients Only)       Balance Overall balance assessment: Mild deficits observed, not formally tested;History of Falls                                          Cognition Arousal/Alertness: Awake/alert Behavior During Therapy: WFL for tasks assessed/performed Overall Cognitive Status: Impaired/Different from baseline Area of Impairment: Attention;Memory;Following commands;Safety/judgement                   Current Attention Level: Selective Memory: Decreased short-term memory;Decreased recall of precautions Following Commands: Follows one step commands consistently Safety/Judgement: Decreased awareness of safety;Decreased awareness of deficits             Exercises      General Comments General comments (skin integrity, edema, etc.): pt reports 3 falls in the last year      Pertinent Vitals/Pain Pain Assessment: No/denies pain     Home Living Family/patient expects to be discharged to:: Private residence Living Arrangements: Spouse/significant other Available Help at Discharge: Family;Available 24 hours/day Type of Home: House Home Access: Stairs to enter   [employer] of Steps: 3   Home Layout: Two level;Able to live on main level with bedroom/bathroom Home Equipment: Rolling Walker (2 wheels);Shower seat;Cane - single point  Prior Function            PT Goals (current goals can now be found in the care plan section) Acute Rehab PT Goals Patient Stated Goal: return home PT Goal Formulation: With patient Time For Goal Achievement: 07/18/21 Potential to Achieve Goals:  Fair Progress towards PT goals: Progressing toward goals    Frequency    Min 5X/week      PT Plan Current plan remains appropriate    Co-evaluation              AM-PAC PT "6 Clicks" Mobility   Outcome Measure  Help needed turning from your back to your side while in a flat bed without using bedrails?: None Help needed moving from lying on your back to sitting on the side of a flat bed without using bedrails?: A Little Help needed moving to and from a bed to a chair (including a wheelchair)?: A Little Help needed standing up from a chair using your arms (e.g., wheelchair or bedside chair)?: A Little Help needed to walk in hospital room?: A Little Help needed climbing 3-5 steps with a railing? : A Little 6 Click Score: 19    End of Session Equipment Utilized During Treatment: Back brace Activity Tolerance: Patient tolerated treatment well Patient left: in chair;with call bell/phone within reach;with family/visitor present Nurse Communication: Mobility status;Precautions PT Visit Diagnosis: Other abnormalities of gait and mobility (R26.89);Difficulty in walking, not elsewhere classified (R26.2)     Time: 8206-0156 PT Time Calculation (min) (ACUTE ONLY): 13 min  Charges:  $Gait Training: 8-22 mins $Therapeutic Activity: 8-22 mins                     Aryanne Gilleland P, PT Acute Rehabilitation Services Pager: [phone] Office: [place] 07/04/2021, 1:00 PM

## 2021-07-04 NOTE — Progress Notes (Signed)
    Subjective: Procedure(s) (LRB): Lumbar three through five decompression with lumbar three through five insitu fusion (N/A) 1 Day Post-Op  Patient reports pain as 1 on 0-10 scale.  Reports decreased leg pain reports incisional back pain   Positive void Negative bowel movement Positive flatus Negative chest pain or shortness of breath  Objective: Vital signs in last 24 hours: Temp:  [97.3 F (36.3 C)-98.2 F (36.8 C)] 97.9 F (36.6 C) (12/09 0434) Pulse Rate:  [63-98] 89 (12/09 0437) Resp:  [13-20] 16 (12/09 0434) BP: (100-208)/(70-108) 176/108 (12/09 0437) SpO2:  [95 %-100 %] 100 % (12/09 0437) Weight:  [70.3 kg] 70.3 kg (12/08 1059)  Intake/Output from previous day: 12/08 0701 - 12/09 0700 In: 3082.7 [P.O.:480; I.V.:2000; IV Piggyback:202.7] Out: 835 [Urine:525; Drains:110; Blood:200]  Labs: No results for input(s): WBC, RBC, HCT, PLT in the last 72 hours. No results for input(s): NA, K, CL, CO2, BUN, CREATININE, GLUCOSE, CALCIUM in the last 72 hours. No results for input(s): LABPT, INR in the last 72 hours.  Physical Exam: Neurologically intact ABD soft Intact pulses distally Dorsiflexion/Plantar flexion intact Incision: dressing C/D/I Compartment soft Body mass index is 21.93 kg/m. Drain: 110   Assessment/Plan: Patient stable  Continue mobilization with physical therapy Continue care  Advance diet Up with therapy Will reevaluate patient is afternoon if output has decreased we will pull the drain and allow him to be discharged home later today. Patient is doing exceptionally well.  Ambulating with no significant neuropathic leg pain. Patient continues to have some short-term memory deficits which is at his baseline. Plan on discharge to home provided his wife is comfortable.  Melina Schools, MD [facility] [phone]

## 2021-07-04 NOTE — Progress Notes (Signed)
Patient awaiting transport via wheelchair by volunteer for discharge home; in no acute distress nor complaints of pain nor discomfort; incision on his back with honeycomb dressing and is clean, dry and intact; room was checked and accounted for all his belongings; discharge instructions concerning his medications, incision care, follow up appointment and when to call the doctor as needed were all discussed with patient and his wife by RN and both expressed understanding on the instructions given.

## 2021-07-04 NOTE — Plan of Care (Signed)

## 2021-07-04 NOTE — Evaluation (Signed)
Occupational Therapy Evaluation Patient Details Name: [name] MRN: [id number] DOB: Nov 12, 1950 Today's Date: 07/04/2021   History of Present Illness 70 yo admitted 12/8 for L3-5 spinal fusion. PMHx: Parkinson's, BPH, HTn, HLD, GIB   Clinical Impression   Pt seen for procedure listed above. PTA pt reports that he needed some assist with ambulating due to poor balance and feet issues, as well as some assist with ADL's. At this time, pt is presenting with decreased cognition, poor recall and oriented only to person and place. Additionally, pt with 4/10 pain, requiring increased time to complete dressing, toileting, and grooming this session. Able to complete basic ADL's this session with min guard for safety due to decreased balance and cognitive deficits. He has no further OT needs at this time and acute OT will sign off.      Recommendations for follow up therapy are one component of a multi-disciplinary discharge planning process, led by the attending physician.  Recommendations may be updated based on patient status, additional functional criteria and insurance authorization.   Follow Up Recommendations  No OT follow up    Assistance Recommended at Discharge Set up Supervision/Assistance  Functional Status Assessment  Patient has had a recent decline in their functional status and demonstrates the ability to make significant improvements in function in a reasonable and predictable amount of time.  Equipment Recommendations  None recommended by OT    Recommendations for Other Services       Precautions / Restrictions Precautions Precautions: Back;Fall Precaution Booklet Issued: Yes (comment) Precaution Comments: Reviewed precautions and compensatory strategies. Pt would benefit from continued education. Required Braces or Orthoses: Spinal Brace Spinal Brace: Lumbar corset;Applied in sitting position Restrictions Weight Bearing Restrictions: No      Mobility Bed  Mobility Overal bed mobility: Needs Assistance Bed Mobility: Rolling;Sidelying to Sit;Sit to Sidelying Rolling: Supervision Sidelying to sit: Supervision     Sit to sidelying: Supervision General bed mobility comments: cues for sequence without physical assist    Transfers Overall transfer level: Modified independent Equipment used: None                      Balance Overall balance assessment: Mild deficits observed, not formally tested;History of Falls                                         ADL either performed or assessed with clinical judgement   ADL Overall ADL's : Needs assistance/impaired                                       General ADL Comments: Pt most likely near baseline, requiring overall min guard with intermittent min A for stability and verbal cuing.     Vision Baseline Vision/History: 1 Wears glasses Ability to See in Adequate Light: 1 Impaired Patient Visual Report: No change from baseline Vision Assessment?: No apparent visual deficits Additional Comments: Pt unable to see numbers on the hospital phone, reports this is normal for him.     Perception     Praxis      Pertinent Vitals/Pain Pain Assessment: 0-10 Pain Score: 4  Pain Location: Surgical site Pain Descriptors / Indicators: Aching;Discomfort;Grimacing Pain Intervention(s): Monitored during session;Repositioned     Hand Dominance Right   Extremity/Trunk Assessment Upper Extremity Assessment  Upper Extremity Assessment: Overall WFL for tasks assessed   Lower Extremity Assessment Lower Extremity Assessment: Defer to PT evaluation   Cervical / Trunk Assessment Cervical / Trunk Assessment: Back Surgery   Communication Communication Communication: No difficulties   Cognition Arousal/Alertness: Awake/alert Behavior During Therapy: WFL for tasks assessed/performed Overall Cognitive Status: Impaired/Different from baseline Area of Impairment:  Attention;Memory;Following commands;Safety/judgement                   Current Attention Level: Selective Memory: Decreased short-term memory;Decreased recall of precautions Following Commands: Follows one step commands consistently;Follows one step commands with increased time Safety/Judgement: Decreased awareness of deficits     General Comments: pt unaware of how to don brace, unable to recall back precautions, decreased ability to recall commands, unable to fully state PLOf and assist. Initially stating that he was here for exercises, then with further discussion pt able to state that he was here for back surgery.     General Comments  pt reports 3 falls in the last year    Exercises     Shoulder Instructions      Home Living Family/patient expects to be discharged to:: Private residence Living Arrangements: Spouse/significant other Available Help at Discharge: Family;Available 24 hours/day Type of Home: House Home Access: Stairs to enter [employer] of Steps: 3   Home Layout: Two level;Able to live on main level with bedroom/bathroom     Bathroom Shower/Tub: Hospital doctor Toilet: Handicapped height     Home Equipment: Conservation officer, nature (2 wheels);Shower seat;Cane - single point          Prior Functioning/Environment Prior Level of Function : Needs assist       Physical Assist : Mobility (physical);ADLs (physical) Mobility (physical): Gait ADLs (physical): Grooming;Bathing Mobility Comments: pt reports that wife assists with gait for steadying at times ADLs Comments: pt states wife assists with some things, but that he can dress himself and bathe himself, due to cognition, unclear of accuracy        OT Problem List: Decreased strength;Decreased activity tolerance;Impaired balance (sitting and/or standing);Decreased cognition;Decreased safety awareness;Decreased knowledge of use of DME or AE;Pain      OT Treatment/Interventions:       OT Goals(Current goals can be found in the care plan section) Acute Rehab OT Goals Patient Stated Goal: To lessen pain and walk better OT Goal Formulation: With patient Time For Goal Achievement: 07/04/21 Potential to Achieve Goals: Good  OT Frequency:     Barriers to D/C:            Co-evaluation              AM-PAC OT "6 Clicks" Daily Activity     Outcome Measure Help from another person eating meals?: A Little Help from another person taking care of personal grooming?: A Little Help from another person toileting, which includes using toliet, bedpan, or urinal?: A Little Help from another person bathing (including washing, rinsing, drying)?: A Little Help from another person to put on and taking off regular upper body clothing?: A Little Help from another person to put on and taking off regular lower body clothing?: A Little 6 Click Score: 18   End of Session Equipment Utilized During Treatment: Back brace Nurse Communication: Mobility status  Activity Tolerance: Patient tolerated treatment well Patient left: in bed;with call bell/phone within reach;with bed alarm set  OT Visit Diagnosis: Unsteadiness on feet (R26.81);History of falling (Z91.81);Muscle weakness (generalized) (M62.81)  Time: 0912-0939 OT Time Calculation (min): 27 min Charges:  OT General Charges $OT Visit: 1 Visit OT Evaluation $OT Eval Low Complexity: 1 Low OT Treatments $Self Care/Home Management : 8-22 mins  Kamea Dacosta H., OTR/L Acute Rehabilitation  Esmay Amspacher Elane Kenedy Haisley 07/04/2021, 10:15 AM

## 2021-07-04 NOTE — Evaluation (Signed)
Physical Therapy Evaluation Patient Details Name: [name] MRN: [id number] DOB: 08/04/1950 Today's Date: 07/04/2021  History of Present Illness  70 yo admitted 12/8 for L3-5 spinal fusion. PMHx: Parkinson's, BPH, HTn, HLD, GIB  Clinical Impression  Pt very pleasant with confusion and decreased STM. Pt educated for back precautions, brace wear, transfers, progression and gait. Pt able to walk without assist with supervision for safety and recommend initial 24hr assist for safety, assisting with brace wear and precaution recall. Wife not present for education or to confirm assist. Pt will benefit from acute therapy to maximize mobility, safety and function.        Recommendations for follow up therapy are one component of a multi-disciplinary discharge planning process, led by the attending physician.  Recommendations may be updated based on patient status, additional functional criteria and insurance authorization.  Follow Up Recommendations Follow physician's recommendations for discharge plan and follow up therapies    Assistance Recommended at Discharge Frequent or constant Supervision/Assistance  Functional Status Assessment Patient has had a recent decline in their functional status and demonstrates the ability to make significant improvements in function in a reasonable and predictable amount of time.  Equipment Recommendations  None recommended by PT    Recommendations for Other Services       Precautions / Restrictions Precautions Precautions: Back;Fall Precaution Booklet Issued: Yes (comment) Required Braces or Orthoses: Spinal Brace Spinal Brace: Lumbar corset;Applied in sitting position Restrictions Weight Bearing Restrictions: No      Mobility  Bed Mobility Overal bed mobility: Needs Assistance Bed Mobility: Rolling;Sidelying to Sit Rolling: Supervision Sidelying to sit: Supervision       General bed mobility comments: cues for sequence without physical  assist    Transfers Overall transfer level: Modified independent                      Ambulation/Gait Ambulation/Gait assistance: Supervision Gait Distance (Feet): 500 Feet Assistive device: None Gait Pattern/deviations: Step-through pattern;Decreased stride length   Gait velocity interpretation: >2.62 ft/sec, indicative of community ambulatory   General Gait Details: supervision for safety and directional cues, pt with steady gait without LOB  Stairs Stairs: Yes Stairs assistance: Modified independent (Device/Increase time) Stair Management: One rail Left;Alternating pattern;Forwards Number of Stairs: 3 General stair comments: pt with steady gait with use of rail  Wheelchair Mobility    Modified Rankin (Stroke Patients Only)       Balance Overall balance assessment: Mild deficits observed, not formally tested;History of Falls                                           Pertinent Vitals/Pain Pain Assessment: No/denies pain    Home Living Family/patient expects to be discharged to:: Private residence Living Arrangements: Spouse/significant other Available Help at Discharge: Family;Available 24 hours/day Type of Home: House Home Access: Stairs to enter   [employer] of Steps: 3   Home Layout: Two level;Able to live on main level with bedroom/bathroom Home Equipment: Rolling Walker (2 wheels);Shower seat;Cane - single point      Prior Function Prior Level of Function : Needs assist       Physical Assist : Mobility (physical);ADLs (physical) Mobility (physical): Gait ADLs (physical): Grooming;Bathing Mobility Comments: pt reports that wife assists with gait for steadying at times ADLs Comments: pt states wife assists with some of bathing and dressing but unable  to clarify     Hand Dominance        Extremity/Trunk Assessment   Upper Extremity Assessment Upper Extremity Assessment: Generalized weakness    Lower  Extremity Assessment Lower Extremity Assessment: Generalized weakness    Cervical / Trunk Assessment Cervical / Trunk Assessment: Back Surgery  Communication   Communication: No difficulties  Cognition Arousal/Alertness: Awake/alert Behavior During Therapy: WFL for tasks assessed/performed Overall Cognitive Status: Impaired/Different from baseline Area of Impairment: Attention;Memory;Following commands;Safety/judgement                   Current Attention Level: Selective Memory: Decreased short-term memory;Decreased recall of precautions Following Commands: Follows one step commands consistently;Follows one step commands with increased time Safety/Judgement: Decreased awareness of deficits     General Comments: pt unaware of how to don brace, unable to recall back precautions, decreased ability to recall commands, unable to fully state PLOf and assist        General Comments General comments (skin integrity, edema, etc.): pt reports 3 falls in the last year    Exercises     Assessment/Plan    PT Assessment Patient needs continued PT services  PT Problem List Decreased mobility;Decreased activity tolerance;Decreased balance;Decreased knowledge of use of DME;Decreased knowledge of precautions       PT Treatment Interventions Gait training;Stair training;Functional mobility training;Cognitive remediation;Patient/family education;Therapeutic activities;Therapeutic exercise    PT Goals (Current goals can be found in the Care Plan section)  Acute Rehab PT Goals Patient Stated Goal: return home PT Goal Formulation: With patient Time For Goal Achievement: 07/18/21 Potential to Achieve Goals: Fair    Frequency Min 5X/week   Barriers to discharge        Co-evaluation               AM-PAC PT "6 Clicks" Mobility  Outcome Measure Help needed turning from your back to your side while in a flat bed without using bedrails?: None Help needed moving from lying on  your back to sitting on the side of a flat bed without using bedrails?: A Little Help needed moving to and from a bed to a chair (including a wheelchair)?: A Little Help needed standing up from a chair using your arms (e.g., wheelchair or bedside chair)?: A Little Help needed to walk in hospital room?: A Little Help needed climbing 3-5 steps with a railing? : A Little 6 Click Score: 19    End of Session Equipment Utilized During Treatment: Back brace Activity Tolerance: Patient tolerated treatment well Patient left: in chair;with call bell/phone within reach Nurse Communication: Mobility status;Precautions PT Visit Diagnosis: Other abnormalities of gait and mobility (R26.89);Difficulty in walking, not elsewhere classified (R26.2)    Time: 7026-3785 PT Time Calculation (min) (ACUTE ONLY): 26 min   Charges:   PT Evaluation $PT Eval Moderate Complexity: 1 Mod PT Treatments $Gait Training: 8-22 mins        Arzu Mcgaughey P, PT Acute Rehabilitation Services Pager: [phone] Office: [place] 07/04/2021, 9:57 AM

## 2021-07-05 ENCOUNTER — Other Ambulatory Visit (HOSPITAL_COMMUNITY): Payer: Self-pay

## 2021-07-05 MED FILL — Simvastatin Tab 40 MG: ORAL | 90 days supply | Qty: 90 | Fill #1 | Status: AC

## 2021-07-07 ENCOUNTER — Telehealth: Payer: Self-pay | Admitting: Adult Health

## 2021-07-07 NOTE — Telephone Encounter (Signed)
Pt's wife, Zack Seal (on [place]) called, he had surgery on 07/03/21. Now experiencing talking to people that are not there, agitated. Getting up every hour through the last 2 nights. Administered to him Seroquel 50mg  last night and 25 mg this morning. Recommended by spinal surgeon at [facility], Dr. Rolena Infante to see his neurologist. Would like a call from the nurse.

## 2021-07-07 NOTE — Telephone Encounter (Signed)
Looks like he was started on some new medications for pain control which could be contributing. Would recommend they reach back out to surgeon to further discuss as these symptoms started after procedure. If symptoms persist, would recommend proceeding to ER for more acute evaluation

## 2021-07-07 NOTE — Telephone Encounter (Signed)
Contacted wife back, she states the surgeon wasn't aware of all of his history to advise what to do but he did recommend pt stopping Robaxin, gabapentin, hydrocodone for a few days to see if that helped. Advised her to go to ED if symptoms continue or also contact PCP for lab testing she would like. She understood.

## 2021-07-07 NOTE — Telephone Encounter (Signed)
What do you recommend?

## 2021-07-08 ENCOUNTER — Encounter: Payer: Self-pay | Admitting: Family Medicine

## 2021-07-08 ENCOUNTER — Other Ambulatory Visit: Payer: Self-pay

## 2021-07-08 ENCOUNTER — Ambulatory Visit (INDEPENDENT_AMBULATORY_CARE_PROVIDER_SITE_OTHER): Payer: Medicare Other | Admitting: Family Medicine

## 2021-07-08 VITALS — BP 110/74 | HR 79 | Temp 97.6°F | Wt 151.0 lb

## 2021-07-08 DIAGNOSIS — F03A2 Unspecified dementia, mild, with psychotic disturbance: Secondary | ICD-10-CM

## 2021-07-08 DIAGNOSIS — F03918 Unspecified dementia, unspecified severity, with other behavioral disturbance: Secondary | ICD-10-CM | POA: Diagnosis not present

## 2021-07-08 LAB — CBC WITH DIFFERENTIAL/PLATELET
Basophils Absolute: 0 10*3/uL (ref 0.0–0.2)
Basos: 0 %
EOS (ABSOLUTE): 0 10*3/uL (ref 0.0–0.4)
Eos: 0 %
Hematocrit: 31 % — ABNORMAL LOW (ref 37.5–51.0)
Hemoglobin: 10.5 g/dL — ABNORMAL LOW (ref 13.0–17.7)
Lymphocytes Absolute: 0.9 10*3/uL (ref 0.7–3.1)
Lymphs: 10 %
MCH: 29.7 pg (ref 26.6–33.0)
MCHC: 33.9 g/dL (ref 31.5–35.7)
MCV: 88 fL (ref 79–97)
Monocytes Absolute: 1.3 10*3/uL — ABNORMAL HIGH (ref 0.1–0.9)
Monocytes: 14 %
Neutrophils Absolute: 7.2 10*3/uL — ABNORMAL HIGH (ref 1.4–7.0)
Neutrophils: 76 %
Platelets: 244 10*3/uL (ref 150–450)
RBC: 3.53 x10E6/uL — ABNORMAL LOW (ref 4.14–5.80)
RDW: 15.6 % — ABNORMAL HIGH (ref 11.6–15.4)
WBC: 9.4 10*3/uL (ref 3.4–10.8)

## 2021-07-08 LAB — COMPREHENSIVE METABOLIC PANEL
ALT: 8 IU/L (ref 0–44)
AST: 32 IU/L (ref 0–40)
Albumin/Globulin Ratio: 1.8 (ref 1.2–2.2)
Albumin: 4.4 g/dL (ref 3.7–4.7)
Alkaline Phosphatase: 66 IU/L (ref 44–121)
BUN/Creatinine Ratio: 32 — ABNORMAL HIGH (ref 10–24)
BUN: 31 mg/dL — ABNORMAL HIGH (ref 8–27)
Bilirubin Total: 0.8 mg/dL (ref 0.0–1.2)
CO2: 29 mmol/L (ref 20–29)
Calcium: 9.6 mg/dL (ref 8.6–10.2)
Chloride: 102 mmol/L (ref 96–106)
Creatinine, Ser: 0.98 mg/dL (ref 0.76–1.27)
Globulin, Total: 2.4 g/dL (ref 1.5–4.5)
Glucose: 133 mg/dL — ABNORMAL HIGH (ref 70–99)
Potassium: 4.5 mmol/L (ref 3.5–5.2)
Sodium: 139 mmol/L (ref 134–144)
Total Protein: 6.8 g/dL (ref 6.0–8.5)
eGFR: 82 mL/min/{1.73_m2} (ref >59)

## 2021-07-08 NOTE — Progress Notes (Signed)
° °  Subjective:    Patient ID: [name], male    DOB: 1951/02/03, 70 y.o.   MRN: [id number]  HPI He is here with his wife and son.  He had back surgery done on December 8 and after that he was noted to be disoriented.  Apparently the surgery lasted roughly 6 hours.  Review with his wife indicates that after his previous procedure he had difficulty with CNS dysfunction for approximately week and during that timeframe Haldol was needed because of his agitation.  He also was given Seroquel to help with this.  This presents episode has been unchanged since his surgery.  His wife and son have noted him to be hallucinating, seeing people that are not there, being disoriented to place and time.  He was given Percocet, gabapentin and Robaxin however that was stopped several days ago.   Review of Systems     Objective:   Physical Exam Alert to person and place but and ask about what year it was he said November, missed the date and the year.  He does answer questions unrelated to the question.  EOMI.  Other cranial nerves grossly intact.  DTRs were normal.  Clonus was negative.       Assessment & Plan:  Mild dementia with psychotic disturbance, unspecified dementia type - Plan: CBC with Differential/Platelet, Comprehensive metabolic panel Since this is a second time this is occurred after he is having anesthesia, any further surgical procedures will need to proceed with caution.  Hopefully this will clear up with time.  The big issue right now is although he is animated he is not necessarily agitated but there is concern about him continuing to get up in the middle of night and walking around.  I will wait for the blood work and then probably involve psychiatry to find out which medicine might be effective to keep him sedated mainly at night.  Apparently in the past Seroquel has been used.

## 2021-07-09 ENCOUNTER — Telehealth: Payer: Self-pay | Admitting: Adult Health

## 2021-07-09 ENCOUNTER — Other Ambulatory Visit (HOSPITAL_COMMUNITY): Payer: Self-pay

## 2021-07-09 ENCOUNTER — Encounter: Payer: Self-pay | Admitting: Family Medicine

## 2021-07-09 ENCOUNTER — Other Ambulatory Visit: Payer: Self-pay | Admitting: Neurology

## 2021-07-09 MED ORDER — QUETIAPINE FUMARATE 25 MG PO TABS
25.0000 mg | ORAL_TABLET | Freq: Two times a day (BID) | ORAL | 0 refills | Status: DC
  Filled 2021-07-09: qty 90, 30d supply, fill #0

## 2021-07-09 NOTE — Telephone Encounter (Signed)
Pt's wife called stating that the pt had surgery last week and ever since then his memory issues have been worsening. She also states that they are having issues with his medications and would like to discuss with RN. Please advise.

## 2021-07-09 NOTE — Telephone Encounter (Signed)
Called wife who stated patient had lower back surgery last Thurs. He's had confusion, memory loss ever since. This occurred the last time he had surgery. The surgeon took him off pain medication, PCP did blood work which was fine.  She restarted his seroquel 25 mg last night and gave him a dose this morning. She stated they are not getting any sleep, patient is seeing people not there. She asked if he should stay on seroquel or try something different. I advised her I'll send message to MD and let her know. She  verbalized understanding, appreciation.

## 2021-07-10 ENCOUNTER — Other Ambulatory Visit (HOSPITAL_COMMUNITY): Payer: Self-pay

## 2021-07-15 ENCOUNTER — Telehealth: Payer: Self-pay | Admitting: Pharmacist

## 2021-07-15 NOTE — Chronic Care Management (AMB) (Signed)
A user error has taken place: encounter opened in error, closed for administrative reasons.

## 2021-07-15 NOTE — Chronic Care Management (AMB) (Signed)
Chronic Care Management Pharmacy Assistant   Name: [name]  MRN: [id number] DOB: [birth year]  Reason for Encounter: Chart Prep for initial encounter with [name] Clinical pharmacist on 07/24/21 at 1:30 in office.   Conditions to be addressed/monitored: HTN, HLD, Depression, GERD, and Overactive Bladder   Recent office visits:  07/08/21 Jesus Lung, MD - Patient presented with Mld dementia with psychotic disturbance. No medication changes.  05/13/21 Jesus Lung, MD - Patient presented for Chronic low back pain, unspecified back pain and other concerns. No medication changes.  02/14/21 Jesus Lung, MD - Patient presented for Anemia unspecified. No medication changes.  01/28/21 Jesus Lung, MD - Patient presented for Gastrointestinal hemorrhage and other concerns. No medication changes.   Recent consult visits:  06/30/21  Patient presented to [facility] for Pre-op testing.  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 Prescribed Latanoprost 0.005% daily.  04/14/21 Jesus Man, MD (Cardiology) - Patient presented for Moderate essential hypertension and other concerns. No medication changes.  04/07/21 Jesus Rider, NP (Neurology) - Patient presented for Parkinsonian tremor and other concerns. Changed Trihexyphenidyl HCL 2 mg to 1 mg total two times a day.  03/17/21 Jesus Ree, MD (General Surg) - Patient presented for Ventral hernia without obstruction or gangrene. Stopped Isosorbide Mononitrate 30 mg and Quetiapine Fumarate 25 mg.  03/12/21 [name] (Orthopedic Surg) - Patient presented for Metatarsalgia of right foot. No other visit details available.  03/07/21 [name] (Physical Therapy) - Patient presented for Low back pain. No other visit details available.  03/06/21 [name] (Chiropractic Med) -  Patient presented for Radiculopathy, lumbar region. No other visit details available.  02/28/21 [name] (Orthopedic Surg) - Patient presented for Low back pain. No other visit details available.  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 No other visit details available.    Hospital visits:  Medication Reconciliation was completed by comparing discharge summary, patients EMR and Pharmacy list, and upon discussion with patient.  Patient presented to [facility] on  07/03/21 due to Spinal stenosis. Patient was present for 28 hours.  New?Medications Started at [facility] Discharge:?? -started  methocarbamol (Robaxin) ondansetron (Zofran) oxyCODONE-acetaminophen (Percocet)  Medication Changes at Hospital Discharge: -Changed  None Medications Discontinued at Hospital Discharge: -Stopped acetaminophen 500 MG tablet (TYLENOL) FIBER ADULT GUMMIES PO HYDROcodone-acetaminophen 10-325 MG tablet (Norco) magnesium oxide 400 MG tablet (MAG-OX) meloxicam 15 MG tablet (MOBIC) multivitamin with minerals Tabs tablet oxyCODONE 5 MG immediate release tablet (Oxy IR/ROXICODONE) predniSONE 10 MG tablet (DELTASONE) VITAMIN D PO  Medications that remain the same after Hospital Discharge:??  -All other medications will remain the same.    Medications: Outpatient Encounter Medications as of 07/15/2021  Medication Sig   alfuzosin (UROXATRAL) 10 MG 24 hr tablet TAKE 1 TABLET BY MOUTH ONCE A DAY   carbidopa-levodopa (SINEMET IR) 25-100 MG tablet TAKE 1 AND 1/2 TABLETS BY MOUTH THREE TIMES DAILY   citalopram (CELEXA) 20 MG tablet Take 1 tablet (20 mg total) by mouth daily.   docusate sodium (COLACE) 100 MG capsule Take 100 mg by mouth daily.   gabapentin (NEURONTIN) 300 MG capsule Take 1 capsule by mouth daily at bedtime   irbesartan (AVAPRO) 75 MG tablet Take 1 tablet (75 mg total) by mouth every  morning.   L-Methylfolate-B12-B6-B2 (  CEREFOLIN) 12-25-48-5 MG TABS Take 1 tablet by mouth daily.   latanoprost (XALATAN) 0.005 % ophthalmic solution Place 1 drop into the left eye daily.   nitroGLYCERIN (NITROSTAT) 0.4 MG SL tablet Place 1 tablet (0.4 mg total) under the tongue every 5 (five) minutes as needed for chest pain.   ondansetron (ZOFRAN) 4 MG tablet Take 1 tablet (4 mg total) by mouth every 8 (eight) hours as needed for nausea or vomiting.   pantoprazole (PROTONIX) 40 MG tablet Take 1 tablet (40 mg total) by mouth 2 (two) times daily.   QUEtiapine (SEROQUEL) 25 MG tablet Take 1 tablet by mouth daily during the day and two tablets at night   simvastatin (ZOCOR) 40 MG tablet TAKE 1 TABLET BY MOUTH AT BEDTIME   timolol (TIMOPTIC) 0.5 % ophthalmic solution Place 1 drop into both eyes daily.   trihexyphenidyl (ARTANE) 2 MG tablet Take 1/2 tablet (1 mg total) by mouth 2 (two) times daily with a meal.   verapamil (CALAN-SR) 120 MG CR tablet Take 1 tablet (120 mg total) by mouth daily.   No facility-administered encounter medications on file as of 07/15/2021.  Fill History Gaps : alfuzosin (UROXATRAL) 10 MG 24 hr tablet 06/09/2021 90   carbidopa-levodopa (SINEMET IR) 25-100 MG tablet 06/09/2021 90   citalopram (CELEXA) 20 MG tablet 07/07/2021 30   gabapentin (NEURONTIN) 300 MG capsule 03/06/2021 30   irbesartan (AVAPRO) 75 MG tablet 05/06/2021 90   latanoprost (XALATAN) 0.005 % ophthalmic solution 06/23/2021 50   nitroGLYCERIN (NITROSTAT) 0.4 MG SL tablet 12/20/2020 5   ondansetron (ZOFRAN) 4 MG tablet 07/04/2021 7   pantoprazole (PROTONIX) 40 MG tablet 06/16/2021 90   QUEtiapine (SEROQUEL) 25 MG tablet 07/11/2021 30   simvastatin (ZOCOR) 40 MG tablet 07/07/2021 90   TIMOLOL MALEATE 0.5 % SOLN 04/05/2020 90   trihexyphenidyl (ARTANE) 2 MG tablet 07/07/2021 30   verapamil (CALAN-SR) 120 MG CR tablet 05/06/2021 90   Spoke to patients wife for call, she reports she will be  with him for the visit.  Have you seen any other providers since your last visit? She reports he has seen his Orthopedist and was given pain medications that he is now finished with.  Any changes in your medications or health? Wife reports no change  Any side effects from any medications? Wife reports unsure he takes many medications that she is unsure if he has been experiencing side effects because of contraindications of other medications.  Do you have an symptoms or problems not managed by your medications? She reports he has had syncope episodes with some of his heart medications unsure if it has been due to timing of med's, she also reports he has urology issues.  Any concerns about your health right now? She is concerned that he may be taking medications he should not be that have an effect on his other medications ability to work properly, reports that she does not think the pharmacy has been checking if he should be taking certain medications together, recently got over some delirium because of the above.  Has your provider asked that you check blood pressure, blood sugar, or follow special diet at home? Wife reports she has checked when he has had the episodes of syncope but they do not check regularly at home.  Can you think of a goal you would like to reach for your health? Wife reports to be sure his meds are prescribed appropriately with no contraindications of each other.  Do you have  any problems getting your medications? Wife reports they are happy with the cost and current pharmacy.  Is there anything that you would like to discuss during the appointment? Wife reports not at this time.  Wife aware they will receive a reminder call and to bring blood pressure machine, medications and supplements to appointment    Care Gaps: BP- 110/74 (07/08/21) AWV- 2/22  Star Rating Drugs: Irbesartan (Avapro) 75 mg - Last filled 05/06/21 90 DS at [facility] Simvastatin  (Zocor) 40 mg - Last filled 07/07/21 90 DS at [place] Pharmacist Assistant [phone]

## 2021-07-23 ENCOUNTER — Telehealth: Payer: Self-pay | Admitting: Pharmacist

## 2021-07-23 ENCOUNTER — Other Ambulatory Visit (HOSPITAL_COMMUNITY): Payer: Self-pay

## 2021-07-23 NOTE — Chronic Care Management (AMB) (Signed)
° ° °  Chronic Care Management Pharmacy Assistant   Name: WESTLY HINNANT  MRN: [id number] DOB: 07/14/1951  07/23/21 APPOINTMENT REMINDER    Patients wife was reminded to have all medications, supplements and any blood glucose and blood pressure readings available for review with [name], Pharm. D, for office visit on 07/24/21 at 1:30.    Care Gaps: BP- 110/74 (07/08/21) AWV- 2/22  Star Rating Drug: Irbesartan (Avapro) 75 mg - Last filled 05/06/21 90 DS at [facility] Simvastatin (Zocor) 40 mg - Last filled 07/07/21 90 DS at [facility]  Any gaps in medications fill history? None    Medications: Outpatient Encounter Medications as of 07/23/2021  Medication Sig   alfuzosin (UROXATRAL) 10 MG 24 hr tablet TAKE 1 TABLET BY MOUTH ONCE A DAY   carbidopa-levodopa (SINEMET IR) 25-100 MG tablet TAKE 1 AND 1/2 TABLETS BY MOUTH THREE TIMES DAILY   citalopram (CELEXA) 20 MG tablet Take 1 tablet (20 mg total) by mouth daily.   docusate sodium (COLACE) 100 MG capsule Take 100 mg by mouth daily.   gabapentin (NEURONTIN) 300 MG capsule Take 1 capsule by mouth daily at bedtime   irbesartan (AVAPRO) 75 MG tablet Take 1 tablet (75 mg total) by mouth every morning.   L-Methylfolate-B12-B6-B2 (CEREFOLIN) 12-25-48-5 MG TABS Take 1 tablet by mouth daily.   latanoprost (XALATAN) 0.005 % ophthalmic solution Place 1 drop into the left eye daily.   nitroGLYCERIN (NITROSTAT) 0.4 MG SL tablet Place 1 tablet (0.4 mg total) under the tongue every 5 (five) minutes as needed for chest pain.   ondansetron (ZOFRAN) 4 MG tablet Take 1 tablet (4 mg total) by mouth every 8 (eight) hours as needed for nausea or vomiting.   pantoprazole (PROTONIX) 40 MG tablet Take 1 tablet (40 mg total) by mouth 2 (two) times daily.   QUEtiapine (SEROQUEL) 25 MG tablet Take 1 tablet by mouth daily during the day and two tablets at night   simvastatin (ZOCOR) 40 MG tablet TAKE 1 TABLET BY MOUTH AT BEDTIME    timolol (TIMOPTIC) 0.5 % ophthalmic solution Place 1 drop into both eyes daily.   trihexyphenidyl (ARTANE) 2 MG tablet Take 1/2 tablet (1 mg total) by mouth 2 (two) times daily with a meal.   verapamil (CALAN-SR) 120 MG CR tablet Take 1 tablet (120 mg total) by mouth daily.   No facility-administered encounter medications on file as of 07/23/2021.      [place] Clinical Pharmacist Assistant [phone]

## 2021-07-24 ENCOUNTER — Ambulatory Visit (INDEPENDENT_AMBULATORY_CARE_PROVIDER_SITE_OTHER): Payer: Medicare Other | Admitting: Pharmacist

## 2021-07-24 ENCOUNTER — Other Ambulatory Visit: Payer: Self-pay

## 2021-07-24 ENCOUNTER — Other Ambulatory Visit (INDEPENDENT_AMBULATORY_CARE_PROVIDER_SITE_OTHER): Payer: Self-pay | Admitting: Ophthalmology

## 2021-07-24 ENCOUNTER — Other Ambulatory Visit (HOSPITAL_COMMUNITY): Payer: Self-pay

## 2021-07-24 DIAGNOSIS — I10 Essential (primary) hypertension: Secondary | ICD-10-CM

## 2021-07-24 DIAGNOSIS — E785 Hyperlipidemia, unspecified: Secondary | ICD-10-CM

## 2021-07-24 MED ORDER — TIMOLOL MALEATE 0.5 % OP SOLN
1.0000 [drp] | Freq: Every day | OPHTHALMIC | 6 refills | Status: DC
Start: 2021-07-24 — End: 2021-10-06
  Filled 2021-07-24: qty 10, 80d supply, fill #0

## 2021-07-24 MED ORDER — TIMOLOL MALEATE 0.5 % OP SOLN
OPHTHALMIC | 4 refills | Status: DC
Start: 2021-07-24 — End: 2021-10-06

## 2021-07-24 MED ORDER — TIMOLOL MALEATE 0.5 % OP SOLN
OPHTHALMIC | 4 refills | Status: DC
  Filled 2021-07-24: qty 10, 80d supply, fill #0

## 2021-07-24 NOTE — Progress Notes (Signed)
Okay to refill with change of pharmacy alone, timolol 0.5% 1 drop both eyes every morning

## 2021-07-24 NOTE — Progress Notes (Signed)
Chronic Care Management Pharmacy Note  07/24/2021 Name:  [name] MRN:  [id number] DOB:  1951-07-05  Summary: LDL at goal < 70 but pt is having muscle aches Pt does not check BP at home often  Recommendations/Changes made from today's visit: -Recommend switching simvastatin to atorvastatin to assess tolerability -Recommended restarting fiber gummies and multivitamin -Recommended moving irbesartan to the evening to prevent dizziness in daytime -Recommended routine BP monitoring at home   Plan: BP assessment in 1-2 months   Subjective: [name] is an 70 y.o. year old male who is a primary patient of Denita Lung, MD.  The CCM team was consulted for assistance with disease management and care coordination needs.    Engaged with patient face to face for initial visit in response to provider referral for pharmacy case management and/or care coordination services.   Consent to Services:  The patient was given the following information about Chronic Care Management services today, agreed to services, and gave verbal consent: 1. CCM service includes personalized support from designated clinical staff supervised by the primary care provider, including individualized plan of care and coordination with other care providers 2. 24/7 contact phone numbers for assistance for urgent and routine care needs. 3. Service will only be billed when office clinical staff spend 20 minutes or more in a month to coordinate care. 4. Only one practitioner may furnish and bill the service in a calendar month. 5.The patient may stop CCM services at any time (effective at the end of the month) by phone call to the office staff. 6. The patient will be responsible for cost sharing (co-pay) of up to 20% of the service fee (after annual deductible is met). Patient agreed to services and consent obtained.  Patient Care Team: Denita Lung, MD as PCP - General (Family Medicine) [name], MD as  PCP - Cardiology (Cardiology) Viona Gilmore, [facility] as Pharmacist (Pharmacist)  Recent office visits: 07/08/21 Denita Lung, MD - Patient presented with Mld dementia with psychotic disturbance. No medication changes.   05/13/21 Denita Lung, MD - Patient presented for Chronic low back pain, unspecified back pain and other concerns. No medication changes.   02/14/21 Denita Lung, MD - Patient presented for Anemia unspecified. No medication changes.   01/28/21 Denita Lung, MD - Patient presented for Gastrointestinal hemorrhage and other concerns. No medication changes.  Recent consult visits: 06/30/21  Patient presented to [facility] for Pre-op testing.   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 Prescribed Latanoprost 0.005% daily.   04/14/21 [name], MD (Cardiology) - Patient presented for Moderate essential hypertension and other concerns. No medication changes.   04/07/21 Frann Rider, NP (Neurology) - Patient presented for Parkinsonian tremor and other concerns. Changed Trihexyphenidyl HCL 2 mg to 1 mg total two times a day.   03/17/21 [name], MD (General Surg) - Patient presented for Ventral hernia without obstruction or gangrene. Stopped Isosorbide Mononitrate 30 mg and Quetiapine Fumarate 25 mg.   03/12/21 Wylene Simmer (Orthopedic Surg) - Patient presented for Metatarsalgia of right foot. No other visit details available.   03/07/21 Jacquelynn Cree (Physical Therapy) - Patient presented for Low back pain. No other visit details available.   03/06/21 Levy Pupa (Chiropractic Med) - Patient presented for Radiculopathy, lumbar region. No other visit details available.   02/28/21 Rolena Infante,  [place] (Orthopedic Surg) - Patient presented for Low back pain. No other visit details available.   02/11/21 Suella Broad (Physical Med) -  Patient presented for Spondylosis without myelopathy or radiculopathy, lumbar region. No other visit details available.  Hospital visits: Medication Reconciliation was completed by comparing discharge summary, patients EMR and Pharmacy list, and upon discussion with patient.   Patient presented to [facility] on  07/03/21 due to Spinal stenosis. Patient was present for 28 hours.   New?Medications Started at [facility] Discharge:?? -started  methocarbamol (Robaxin) ondansetron (Zofran) oxyCODONE-acetaminophen (Percocet)   Medication Changes at Hospital Discharge: -Changed  None Medications Discontinued at Hospital Discharge: -Stopped acetaminophen 500 MG tablet (TYLENOL) FIBER ADULT GUMMIES PO HYDROcodone-acetaminophen 10-325 MG tablet (Norco) magnesium oxide 400 MG tablet (MAG-OX) meloxicam 15 MG tablet (MOBIC) multivitamin with minerals Tabs tablet oxyCODONE 5 MG immediate release tablet (Oxy IR/ROXICODONE) predniSONE 10 MG tablet (DELTASONE) VITAMIN D PO   Medications that remain the same after Hospital Discharge:??  -All other medications will remain the same.    Objective:  Lab Results  Component Value Date   CREATININE 0.98 07/08/2021   BUN 31 (H) 07/08/2021   GFRNONAA >60 06/30/2021   GFRAA 68 09/10/2020   NA 139 07/08/2021   K 4.5 07/08/2021   CALCIUM 9.6 07/08/2021   CO2 29 07/08/2021   GLUCOSE 133 (H) 07/08/2021    No results found for: HGBA1C, FRUCTOSAMINE, GFR, MICROALBUR  Last diabetic Eye exam: No results found for: HMDIABEYEEXA  Last diabetic Foot exam: No results found for: HMDIABFOOTEX   Lab Results  Component Value Date   CHOL 76 01/07/2021   HDL 35 (L) 01/07/2021   LDLCALC 34 01/07/2021   TRIG 36 01/07/2021   CHOLHDL 2.2 01/07/2021    Hepatic Function Latest Ref Rng & Units 07/08/2021 06/30/2021 01/28/2021  Total Protein 6.0 - 8.5 g/dL 6.8 7.5 6.3  Albumin 3.7 - 4.7 g/dL 4.4 4.3 4.0  AST 0 - 40 IU/L 32 24 26  ALT 0 - 44  IU/L _0 Alk Phosphatase 44 - 121 IU/L 66 63 71  Total Bilirubin 0.0 - 1.2 mg/dL 0.8 0.4 0.2  Bilirubin, Direct <=0.2 mg/dL - - -    Lab Results  Component Value Date/Time   TSH 3.131 01/11/2021 04:55 PM   TSH 1.950 09/28/2016 09:18 AM   TSH 2.045 07/03/2011 03:37 PM    CBC Latest Ref Rng & Units 07/08/2021 06/30/2021 02/14/2021  WBC 3.4 - 10.8 x10E3/uL 9.4 4.1 5.7  Hemoglobin 13.0 - 17.7 g/dL 10.5(L) 12.4(L) 11.0(L)  Hematocrit 37.5 - 51.0 % 31.0(L) 39.0 34.7(L)  Platelets 150 - 450 x10E3/uL 244 256 230    No results found for: VD25OH  Clinical ASCVD: Yes  The ASCVD Risk score (Arnett DK, et al., 2019) failed to calculate for the following reasons:   The valid total cholesterol range is 130 to 320 mg/dL    Depression screen [facility] 2/9 09/10/2020 03/20/2020 01/17/2020  Decreased Interest 0 0 0  Down, Depressed, Hopeless 0 0 1  PHQ - 2 Score 0 0 1  Altered sleeping - - -  Tired, decreased energy - - -  Change in appetite - - -  Feeling bad or failure about yourself  - - -  Trouble concentrating - - -  Moving slowly or fidgety/restless - - -  Suicidal thoughts - - -  PHQ-9 Score - - -  Difficult doing work/chores - - -  Some recent data might be hidden  Social History   Tobacco Use  Smoking Status Former   Types: Cigars   Quit date: 07/27/1988   Years since quitting: 33.0  Smokeless Tobacco Never   BP Readings from Last 3 Encounters:  07/08/21 110/74  07/04/21 (!) 128/91  06/30/21 (!) 158/90   Pulse Readings from Last 3 Encounters:  07/08/21 79  07/04/21 90  06/30/21 60   Wt Readings from Last 3 Encounters:  07/08/21 151 lb (68.5 kg)  07/03/21 155 lb (70.3 kg)  06/30/21 155 lb 6.4 oz (70.5 kg)   BMI Readings from Last 3 Encounters:  07/08/21 21.36 kg/m  07/03/21 21.93 kg/m  06/30/21 22.30 kg/m    Assessment/Interventions: Review of patient past medical history, allergies, medications, health status, including review of consultants reports,  laboratory and other test data, was performed as part of comprehensive evaluation and provision of chronic care management services.   SDOH:  (Social Determinants of Health) assessments and interventions performed: Yes SDOH Interventions    Flowsheet Row Most Recent Value  SDOH Interventions   Financial Strain Interventions Intervention Not Indicated  Transportation Interventions Intervention Not Indicated      SDOH Screenings   Alcohol Screen: Not on file  Depression (PHQ2-9): Low Risk    PHQ-2 Score: 0  Financial Resource Strain: Low Risk    Difficulty of Paying Living Expenses: Not very hard  Food Insecurity: Not on file  Housing: Not on file  Physical Activity: Not on file  Social Connections: Not on file  Stress: Not on file  Tobacco Use: Medium Risk   Smoking Tobacco Use: Former   Smokeless Tobacco Use: Never   Passive Exposure: Not on file  Transportation Needs: No Transportation Needs   Lack of Transportation (Medical): No   Lack of Transportation (Non-Medical): No   Wife reports unsure he takes many medications that she is unsure if he has been experiencing side effects because of contraindications of other medications.  She reports he has had syncope episodes with some of his heart medications unsure if it has been due to timing of med's, she also reports he has urology issues.  She is concerned that he may be taking medications he should not be that have an effect on his other medications ability to work properly, reports that she does not think the pharmacy has been checking if he should be taking certain medications together, recently got over some delirium because of the above.  Patient lives with his wife, who is a retired Scientist, product/process development and life has been different for them both the last few months. He was working full time and driving and then had diverticulosis and was in the ICU and bleeding. He also just had surgery on his back and feet earlier this month and is  recovering from that.  He has a brace on for back surgery and has just been cleared to walk regularly. From the surgery, his anesthesia caused delirium and the medications he was started on (gabapentin, zofran, robaxin) caused confusion and they were all stopped. He is not having any pain right now.  Patient sitting most of the day but has been dressing and feeding himself. They will be going back in 4 weeks to have the dressing changed from his surgery. Right now he just rides to the grocery store but doesn't get out of the car and his orientation is still a little cloudy along with his recall which is not as good as it once was.  Patient is sleeping pretty well  for the most part. He twitches during the night and she doesn't usually sleep with him but has been post surgery because she was worried about him. He has been talking to himself while asleep but does fall asleep pretty quickly. He gets up about 3 times a night to void and gets woken up by their new puppy as well.  Patient and wife have been playing around with the timing of his medications because   CCM Care Plan  No Known Allergies  Medications Reviewed Today     Reviewed by Viona Gilmore, [facility] (Pharmacist) on 07/24/21 at 1425  Med List Status: <None>   Medication Order Taking? Sig Documenting Provider Last Dose Status Informant  alfuzosin (UROXATRAL) 10 MG 24 hr tablet [id number] Yes TAKE 1 TABLET BY MOUTH ONCE A DAY MacDiarmid, Scott, MD Taking Active Spouse/Significant Other  carbidopa-levodopa (SINEMET IR) 25-100 MG tablet [id number] Yes TAKE 1 AND 1/2 TABLETS BY MOUTH THREE TIMES DAILY Garvin Fila, MD Taking Active Spouse/Significant Other  citalopram (CELEXA) 20 MG tablet [id number] Yes Take 1 tablet (20 mg total) by mouth daily. Denita Lung, MD Taking Active Spouse/Significant Other  docusate sodium (COLACE) 100 MG capsule [id number] Yes Take 100 mg by mouth daily. [provider] Taking Active  Spouse/Significant Other  irbesartan (AVAPRO) 75 MG tablet [id number] Yes Take 1 tablet (75 mg total) by mouth every morning. Denita Lung, MD Taking Active Spouse/Significant Other  L-Methylfolate-B12-B6-B2 (CEREFOLIN) 12-25-48-5 MG TABS [id number] Yes Take 1 tablet by mouth daily. Garvin Fila, MD Taking Active Spouse/Significant Other  latanoprost (XALATAN) 0.005 % ophthalmic solution [id number] Yes Place 1 drop into the left eye daily. Rankin, Clent Demark, MD Taking Active Spouse/Significant Other  nitroGLYCERIN (NITROSTAT) 0.4 MG SL tablet [id number] Yes Place 1 tablet (0.4 mg total) under the tongue every 5 (five) minutes as needed for chest pain. [name], MD Taking Active Spouse/Significant Other  pantoprazole (PROTONIX) 40 MG tablet [id number] Yes Take 1 tablet (40 mg total) by mouth 2 (two) times daily. Denita Lung, MD Taking Active Spouse/Significant Other  QUEtiapine (SEROQUEL) 25 MG tablet [id number] No Take 1 tablet by mouth daily during the day and two tablets at night  Patient not taking: Reported on 07/24/2021   Garvin Fila, MD Not Taking Active   simvastatin (ZOCOR) 40 MG tablet [id number] Yes TAKE 1 TABLET BY MOUTH AT BEDTIME Denita Lung, MD Taking Active Spouse/Significant Other  timolol (TIMOPTIC) 0.5 % ophthalmic solution [id number]  Instill 1 drop into both eyes every morning   Active   timolol (TIMOPTIC) 0.5 % ophthalmic solution [id number] Yes Instill 1 drop into both eyes every morning Rankin, Clent Demark, MD Taking Active   timolol (TIMOPTIC) 0.5 % ophthalmic solution [id number]  Place 1 drop into both eyes daily. Rankin, Clent Demark, MD  Active   trihexyphenidyl (ARTANE) 2 MG tablet [id number] Yes Take 1/2 tablet (1 mg total) by mouth 2 (two) times daily with a meal. Frann Rider, NP Taking Active Spouse/Significant Other  verapamil (CALAN-SR) 120 MG CR tablet [id number] Yes Take 1 tablet (120 mg total) by mouth daily. 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   Secondary corneal edema of right eye 12/12/2019   Ptosis of right eyelid 12/12/2019   OAB (overactive bladder) 11/29/2019   Bunion of great toe of right foot 07/14/2017   Claw toe, acquired, right 07/14/2017   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   Medication management 10/09/2016   Chest pain at rest 07/04/2015   Plantar fasciitis of right foot 02/19/2015   Depression 08/14/2014   Family history of heart disease in male family member before age 69 04/26/2014   Mild cognitive impairment 03/13/2014   Parkinsonian tremor ([place]) 02/12/2014   Hyperlipidemia with target LDL less than 100    Abdominal aortic atherosclerosis (HCC)    Moderate essential hypertension 02/25/2011   Arthropathy 02/25/2011   HAV (hallux abducto valgus) 02/25/2011    Immunization History  Administered Date(s) Administered   DTaP 09/10/1997, 08/11/2007   Fluad Quad(high Dose 65+) 05/17/2019, 05/01/2021   Influenza Split 05/09/2012, 06/09/2015, 07/11/2015   Influenza Whole 07/10/2004, 05/25/2006   Influenza, High Dose Seasonal PF 05/05/2017, 04/15/2018   Influenza,inj,Quad PF,6+ Mos 04/28/2013, 04/26/2014, 05/31/2016   Influenza-Unspecified 05/25/2016   Moderna SARS-COV2 Booster Vaccination 11/07/2020   Moderna Sars-Covid-2 Vaccination 08/20/2019, 09/17/2019, 05/22/2020   Pfizer Covid-19 Vaccine Bivalent Booster 58yrs & up  05/01/2021   Pneumococcal Conjugate-13 09/07/2010   Pneumococcal Polysaccharide-23 07/16/2017   Tdap 09/05/2011   Zoster Recombinat (Shingrix) 01/17/2020, 03/20/2020    Conditions to be addressed/monitored:  Hypertension, Hyperlipidemia, GERD, Depression, BPH, and Parkinson's disease  Care Plan : [place]  Updates made by Viona Gilmore, [place] since 07/24/2021 12:00 AM     Problem: Problem: Hypertension, Hyperlipidemia, GERD, Depression, BPH, and Parkinson's disease      Long-Range Goal: Patient-Specific Goal   Start Date: 07/24/2021  Expected End Date: 07/24/2022  This Visit's Progress: On track  Priority: High  Note:   Current Barriers:  Unable to independently monitor therapeutic efficacy  Pharmacist Clinical Goal(s):  Patient will achieve adherence to monitoring guidelines and medication adherence to achieve therapeutic efficacy through collaboration with PharmD and provider.   Interventions: 1:1 collaboration with Denita Lung, MD regarding development and update of comprehensive plan of care as evidenced by provider attestation and co-signature Inter-disciplinary care team collaboration (see longitudinal plan of care) Comprehensive medication review performed; medication list updated in electronic medical record  Hypertension (BP goal <140/90) -Not ideally controlled -Current treatment: Irbesartan 75 mg 1 tablet every morning Verapamil 120 mg 1 tablet daily - in AM -Medications previously tried: n/a  -Current home readings: not checking consistently other than when symptomatic (manual arm cuff) -Current dietary habits: pays attention to sodium intake; eats out 3 times a week (she is sick of cooking) and doesn't add salt; follows a mediterranean diet and eats a lot of salad and tuna  -Current exercise habits: walking some and light upper body prior to hospital; just started walking to build stamina - PT starts in 4 weeks -Reports  hypotensive/hypertensive symptoms -Educated on Importance of home blood pressure monitoring; Proper BP monitoring technique; Symptoms of hypotension and importance of maintaining adequate hydration; -Counseled to monitor BP at home weekly and when symptomatic, document, and provide log at future appointments -Counseled on diet and exercise extensively Recommended to continue  current medication Recommended taking irbesartan or verapamil in the evening before bedtime.  Hyperlipidemia: (LDL goal < 70) -Controlled -Current treatment: Simvastatin 40 mg 1 tablet at bedtime -Medications previously tried: simvastatin (side effects), Livalo (cost), Crestor (side effects)  -Current dietary patterns: not much red meat, loves bacon, [place] sausage or bacon  -Current exercise habits: just started walking -Educated on Cholesterol goals;  Benefits of statin for ASCVD risk reduction; Importance of limiting foods high in cholesterol; -Counseled on diet and exercise extensively Recommended switching simvastatin to atorvastatin or back to Livalo due to tolerability.  Depression/Anxiety/Delirium (Goal: minimize symptoms) -Not ideally controlled -Current treatment: Citalopram 20 mg 1 tablet daily Methylfolate 1 tablet daily -Medications previously tried/failed: quetiapine (no longer needed) -PHQ9: 0 -GAD7: n/a -Educated on Benefits of medication for symptom control Benefits of cognitive-behavioral therapy with or without medication -Recommended to continue current medication Counseled on benefits of exercise and consider switching to alternative medication.  Parkinson's disease (Goal: minimize symptoms) -Controlled -Current treatment  Carbidopa-levodopa 25-100 mg 1.5 tablets three times daily Trihexyphenidyl 2 mg 1/2 tablet twice daily -Medications previously tried: none  -Recommended to continue current medication  Hiatal hernia (Goal: minimize symptoms) -Controlled -Current treatment   Pantoprazole 40 mg 1 tablet twice daily -Medications previously tried: none  -Recommended to continue current medication  BPH/Overactive bladder (Goal: minimize nighttime trips to bathroom) -Not ideally controlled -Current treatment  Alfuzosin 10 mg 1 tablet daily -Medications previously tried: Myrbetriq (didn't help - felt sedated); oxybutynin, solifenacin (side effects) -Recommended to continue current medication Recommended discussing with urology about trial of Gemtesa or tamsulosin to replace alfuzosin if ineffective/issues with tolerability.  Health Maintenance -Vaccine gaps: none -Current therapy:  Latanoprost 0.005% 1 drop at bedtime Timolol 0.5%  1 drop in both eyes daily Docusate 100 mg 1 capsule twice daily Nitroglycerin 0.4 mg as needed Fiber gummies daily - holding Multivitamin daily - holding -Educated on Cost vs benefit of each product must be carefully weighed by individual consumer -Patient is satisfied with current therapy and denies issues -Recommended restarting fiber gummies and multivitamin.  Patient Goals/Self-Care Activities Patient will:  - take medications as prescribed as evidenced by patient report and record review check blood pressure weekly, document, and provide at future appointments target a minimum of 150 minutes of moderate intensity exercise weekly  Follow Up Plan: The care management team will reach out to the patient again over the next 30 days.       Medication Assistance: None required.  Patient affirms current coverage meets needs.  Compliance/Adherence/Medication fill history: Care Gaps: BP- 110/74 (07/08/21)  Star-Rating Drugs: Irbesartan (Avapro) 75 mg - Last filled 05/06/21 90 DS at [facility] Simvastatin (Zocor) 40 mg - Last filled 07/07/21 90 DS at [place]  Patient's preferred pharmacy is:  [facility] [place] [address] Phone: [phone] Fax:  [phone]  [place], [place] [address] Phone: [phone] Fax: [phone]  Uses pill box? Yes - weekly - AM/PM (not helping much) -wife is giving him medications Pt endorses 99% compliance  We discussed: Benefits of medication synchronization, packaging and delivery as well as enhanced pharmacist oversight with [employer]. Patient decided to: Continue current medication management strategy  Care Plan and Follow Up Patient Decision:  Patient agrees to Care Plan and Follow-up.  Plan: The care management team will reach out to the patient again over the next 30 days.  [name], PharmD, [facility] [phone]

## 2021-07-24 NOTE — Patient Instructions (Addendum)
Hi [name] and [name],  It was great to get to meet you in person! Below is a summary of some of the topics we discussed.   Don't forget to: Move irbesartan to the evening before bedtime Talk to Dr. Matilde Sprang about trying [name] or tamsulosin (Flomax) to replace the alfuzosin   Please reach out to me if you have any questions or need anything before I reach out again!  Best, [name]  [name], PharmD, [facility] [phone]   Visit Information   Goals Addressed             This Visit's Progress    Track and Manage My Blood Pressure-Hypertension       Timeframe:  Long-Range Goal Priority:  Medium Start Date:                             Expected End Date:                       Follow Up Date 10/22/21    - check blood pressure weekly - choose a place to take my blood pressure (home, clinic or office, retail store) - write blood pressure results in a log or diary    Why is this important?   You won't feel high blood pressure, but it can still hurt your blood vessels.  High blood pressure can cause heart or kidney problems. It can also cause a stroke.  Making lifestyle changes like losing a little weight or eating less salt will help.  Checking your blood pressure at home and at different times of the day can help to control blood pressure.  If the doctor prescribes medicine remember to take it the way the doctor ordered.  Call the office if you cannot afford the medicine or if there are questions about it.     Notes:        Patient Care Plan: CCM Pharmacy Care Plan     Problem Identified: Problem: Hypertension, Hyperlipidemia, GERD, Depression, BPH, and Parkinson's disease      Long-Range Goal: Patient-Specific Goal   Start Date: 07/24/2021  Expected End Date: 07/24/2022  This Visit's Progress: On track  Priority: High  Note:   Current Barriers:  Unable to independently monitor therapeutic efficacy  Pharmacist  Clinical Goal(s):  Patient will achieve adherence to monitoring guidelines and medication adherence to achieve therapeutic efficacy through collaboration with PharmD and provider.   Interventions: 1:1 collaboration with Denita Lung, MD regarding development and update of comprehensive plan of care as evidenced by provider attestation and co-signature Inter-disciplinary care team collaboration (see longitudinal plan of care) Comprehensive medication review performed; medication list updated in electronic medical record  Hypertension (BP goal <140/90) -Not ideally controlled -Current treatment: Irbesartan 75 mg 1 tablet every morning Verapamil 120 mg 1 tablet daily - in AM -Medications previously tried: n/a  -Current home readings: not checking consistently other than when symptomatic (manual arm cuff) -Current dietary habits: pays attention to sodium intake; eats out 3 times a week (she is sick of cooking) and doesn't add salt; follows a mediterranean diet and eats a lot of salad and tuna  -Current exercise habits: walking some and light upper body prior to hospital; just started walking to build stamina - PT starts in 4 weeks -Reports hypotensive/hypertensive symptoms -Educated on Importance of home blood pressure monitoring; Proper BP monitoring technique; Symptoms of hypotension and importance  of maintaining adequate hydration; -Counseled to monitor BP at home weekly and when symptomatic, document, and provide log at future appointments -Counseled on diet and exercise extensively Recommended to continue current medication Recommended taking irbesartan or verapamil in the evening before bedtime.  Hyperlipidemia: (LDL goal < 70) -Controlled -Current treatment: Simvastatin 40 mg 1 tablet at bedtime -Medications previously tried: simvastatin (side effects), Livalo (cost), Crestor (side effects)  -Current dietary patterns: not much red meat, loves bacon, [place] sausage or bacon   -Current exercise habits: just started walking -Educated on Cholesterol goals;  Benefits of statin for ASCVD risk reduction; Importance of limiting foods high in cholesterol; -Counseled on diet and exercise extensively Recommended switching simvastatin to atorvastatin or back to Livalo due to tolerability.  Depression/Anxiety/Delirium (Goal: minimize symptoms) -Not ideally controlled -Current treatment: Citalopram 20 mg 1 tablet daily Methylfolate 1 tablet daily -Medications previously tried/failed: quetiapine (no longer needed) -PHQ9: 0 -GAD7: n/a -Educated on Benefits of medication for symptom control Benefits of cognitive-behavioral therapy with or without medication -Recommended to continue current medication Counseled on benefits of exercise and consider switching to alternative medication.  Parkinson's disease (Goal: minimize symptoms) -Controlled -Current treatment  Carbidopa-levodopa 25-100 mg 1.5 tablets three times daily Trihexyphenidyl 2 mg 1/2 tablet twice daily -Medications previously tried: none  -Recommended to continue current medication  Hiatal hernia (Goal: minimize symptoms) -Controlled -Current treatment  Pantoprazole 40 mg 1 tablet twice daily -Medications previously tried: none  -Recommended to continue current medication  BPH/Overactive bladder (Goal: minimize nighttime trips to bathroom) -Not ideally controlled -Current treatment  Alfuzosin 10 mg 1 tablet daily -Medications previously tried: Myrbetriq (didn't help - felt sedated); oxybutynin, solifenacin (side effects) -Recommended to continue current medication Recommended discussing with urology about trial of Gemtesa or tamsulosin to replace alfuzosin if ineffective/issues with tolerability.  Health Maintenance -Vaccine gaps: none -Current therapy:  Latanoprost 0.005% 1 drop at bedtime Timolol 0.5%  1 drop in both eyes daily Docusate 100 mg 1 capsule twice daily Nitroglycerin 0.4 mg as  needed Fiber gummies daily - holding Multivitamin daily - holding -Educated on Cost vs benefit of each product must be carefully weighed by individual consumer -Patient is satisfied with current therapy and denies issues -Recommended restarting fiber gummies and multivitamin.  Patient Goals/Self-Care Activities Patient will:  - take medications as prescribed as evidenced by patient report and record review check blood pressure weekly, document, and provide at future appointments target a minimum of 150 minutes of moderate intensity exercise weekly  Follow Up Plan: The care management team will reach out to the patient again over the next 30 days.       [name] was given information about Chronic Care Management services today including:  CCM service includes personalized support from designated clinical staff supervised by his physician, including individualized plan of care and coordination with other care providers 24/7 contact phone numbers for assistance for urgent and routine care needs. Standard insurance, coinsurance, copays and deductibles apply for chronic care management only during months in which we provide at least 20 minutes of these services. Most insurances cover these services at 100%, however patients may be responsible for any copay, coinsurance and/or deductible if applicable. This service may help you avoid the need for more expensive face-to-face services. Only one practitioner may furnish and bill the service in a calendar month. The patient may stop CCM services at any time (effective at the end of the month) by phone call to the office staff.  Patient agreed to services  and verbal consent obtained.   Patient verbalizes understanding of instructions provided today and agrees to view in [place].  The pharmacy team will reach out to the patient again over the next 30 days.   Viona Gilmore, [facility]

## 2021-07-26 DIAGNOSIS — I10 Essential (primary) hypertension: Secondary | ICD-10-CM

## 2021-07-26 DIAGNOSIS — E785 Hyperlipidemia, unspecified: Secondary | ICD-10-CM

## 2021-07-27 HISTORY — PX: UMBILICAL HERNIA REPAIR: SHX196

## 2021-07-28 ENCOUNTER — Other Ambulatory Visit (HOSPITAL_COMMUNITY): Payer: Self-pay

## 2021-07-29 ENCOUNTER — Other Ambulatory Visit: Payer: Self-pay

## 2021-07-29 ENCOUNTER — Ambulatory Visit (INDEPENDENT_AMBULATORY_CARE_PROVIDER_SITE_OTHER): Payer: Medicare Other | Admitting: Ophthalmology

## 2021-07-29 ENCOUNTER — Encounter (INDEPENDENT_AMBULATORY_CARE_PROVIDER_SITE_OTHER): Payer: Self-pay | Admitting: Ophthalmology

## 2021-07-29 DIAGNOSIS — H34812 Central retinal vein occlusion, left eye, with macular edema: Secondary | ICD-10-CM

## 2021-07-29 DIAGNOSIS — H4051X0 Glaucoma secondary to other eye disorders, right eye, stage unspecified: Secondary | ICD-10-CM | POA: Diagnosis not present

## 2021-07-29 DIAGNOSIS — H18231 Secondary corneal edema, right eye: Secondary | ICD-10-CM

## 2021-07-29 DIAGNOSIS — H401121 Primary open-angle glaucoma, left eye, mild stage: Secondary | ICD-10-CM

## 2021-07-29 NOTE — Assessment & Plan Note (Signed)
Stabilized OD, chronic residual synechia acute angle-closure but no active NVE

## 2021-07-29 NOTE — Assessment & Plan Note (Signed)
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

## 2021-07-29 NOTE — Assessment & Plan Note (Signed)
OS, stable IOP on current medical therapy  I reassured patient that Artane will not have affect on his open-angle glaucoma

## 2021-07-29 NOTE — Assessment & Plan Note (Signed)
Likely due to low perfusion status of the ciliary body, stable overall, no tube touched to the corneal endothelium

## 2021-07-29 NOTE — Progress Notes (Signed)
07/29/2021     CHIEF COMPLAINT Patient presents for  Chief Complaint  Patient presents with   Retina Follow Up      HISTORY OF PRESENT ILLNESS: [name] is a 71 y.o. male who presents to the clinic today for:   HPI     Retina Follow Up           Diagnosis: CRVO/BRVO   Laterality: both eyes   Onset: 5 months ago   Duration: 5 months   Course: stable         Comments   5 weeks IOP check w/ Dr. Zadie Rhine, NO DILATION.  HR today, 66, Dr. Zadie Rhine Patient states vision is stable and unchanged since last visit. Denies any new floaters or FOL. Pt states he is currently using timolol  BID OS, latanoprost QPM OS. Pt states he started a medication called Artane and states he saw it can cause glaucoma and has some questions about it.        Last edited by Hurman Horn, MD on 07/29/2021  9:44 AM.      Referring physician: Denita Lung, MD [place],  [place] [address]  HISTORICAL INFORMATION:   Selected notes from the MEDICAL RECORD NUMBER       CURRENT MEDICATIONS: Current Outpatient Medications (Ophthalmic Drugs)  Medication Sig   latanoprost (XALATAN) 0.005 % ophthalmic solution Place 1 drop into the left eye daily.   timolol (TIMOPTIC) 0.5 % ophthalmic solution Instill 1 drop into both eyes every morning   timolol (TIMOPTIC) 0.5 % ophthalmic solution Instill 1 drop into both eyes every morning   timolol (TIMOPTIC) 0.5 % ophthalmic solution Place 1 drop into both eyes daily.   No current facility-administered medications for this visit. (Ophthalmic Drugs)   Current Outpatient Medications (Other)  Medication Sig   alfuzosin (UROXATRAL) 10 MG 24 hr tablet TAKE 1 TABLET BY MOUTH ONCE A DAY   carbidopa-levodopa (SINEMET IR) 25-100 MG tablet TAKE 1 AND 1/2 TABLETS BY MOUTH THREE TIMES DAILY   citalopram (CELEXA) 20 MG tablet Take 1 tablet (20 mg total) by mouth daily.   docusate sodium (COLACE) 100 MG capsule Take 100 mg by mouth daily.    FIBER ADULT GUMMIES PO Take 1 tablet by mouth 2 (two) times daily.   irbesartan (AVAPRO) 75 MG tablet Take 1 tablet (75 mg total) by mouth every morning.   L-Methylfolate-B12-B6-B2 (CEREFOLIN) 12-25-48-5 MG TABS Take 1 tablet by mouth daily.   Multiple Vitamins-Minerals (MULTIVITAMIN ADULTS 50+ PO) Take 1 tablet by mouth daily.   nitroGLYCERIN (NITROSTAT) 0.4 MG SL tablet Place 1 tablet (0.4 mg total) under the tongue every 5 (five) minutes as needed for chest pain.   pantoprazole (PROTONIX) 40 MG tablet Take 1 tablet (40 mg total) by mouth 2 (two) times daily.   QUEtiapine (SEROQUEL) 25 MG tablet Take 1 tablet by mouth daily during the day and two tablets at night (Patient not taking: Reported on 07/24/2021)   simvastatin (ZOCOR) 40 MG tablet TAKE 1 TABLET BY MOUTH AT BEDTIME   trihexyphenidyl (ARTANE) 2 MG tablet Take 1/2 tablet (1 mg total) by mouth 2 (two) times daily with a meal.   verapamil (CALAN-SR) 120 MG CR tablet Take 1 tablet (120 mg total) by mouth daily.   No current facility-administered medications for this visit. (Other)      REVIEW OF SYSTEMS:    ALLERGIES No Known Allergies  PAST MEDICAL HISTORY Past Medical History:  Diagnosis Date  Allergy    Arthritis    BPH (benign prostatic hyperplasia)    Complication of anesthesia    Pt. stated he had a reaction that ended in him requiring urinary cath placement   Diverticulosis    GERD (gastroesophageal reflux disease)    esophageal spasms   Glaucoma    Gout    Head injury, closed, with concussion    Hepatitis C    chronic - Has been treated with Harvoni   HLD (hyperlipidemia)    statin intolerant (Crestor & Simvastatin) - Taking Livalo 1mg  / week   Hypertension    Parkinson's disease ([place])    Plantar fasciitis    right   PVD (peripheral vascular disease) ([place])    With no claudication; only mild abdominal aortic atherosclerosis noted on ultrasound.   Spinal stenosis of lumbar region    Thoracic ascending  aortic aneurysm    4.2 cm ascending TAA 09/2016 CT, 1 yr f/u rec   Ulcer    Past Surgical History:  Procedure Laterality Date   BUNIONECTOMY WITH WEIL OSTEOTOMY Right 11/02/2019   Procedure: Right Foot Lapidus, Modified McBride Bunionectomy,  Hallux Akin Osteotomy;  Surgeon: Wylene Simmer, MD;  Location: [place];  Service: Orthopedics;  Laterality: Right;   CARDIAC CATHETERIZATION  2005   30% Cx. Dr. Melvern Banker   cataract surgery Left 11/05/2014   COLONOSCOPY     COLONOSCOPY N/A 01/09/2021   Procedure: COLONOSCOPY;  Surgeon: Ronnette Juniper, MD;  Location: [facility];  Service: Gastroenterology;  Laterality: N/A;   ESOPHAGOGASTRODUODENOSCOPY N/A 05/02/2015   Procedure: ESOPHAGOGASTRODUODENOSCOPY (EGD);  Surgeon: Ronald Lobo, MD;  Location: [facility];  Service: Endoscopy;  Laterality: N/A;   ESOPHAGOGASTRODUODENOSCOPY  05/02/2015   no source of pt chest pain endoscopically evident. small hiatal hernia.   ESOPHAGOGASTRODUODENOSCOPY (EGD) WITH PROPOFOL N/A 01/02/2021   Procedure: ESOPHAGOGASTRODUODENOSCOPY (EGD) WITH PROPOFOL;  Surgeon: Clarene Essex, MD;  Location: [facility];  Service: Endoscopy;  Laterality: N/A;   EYE SURGERY     HARDWARE REMOVAL Right 11/02/2019   Procedure: Second Metatarsal Removal of Deep Implant and Rotational Osteotomy;  Surgeon: Wylene Simmer, MD;  Location: [facility];  Service: Orthopedics;  Laterality: Right;   IR ANGIOGRAM SELECTIVE EACH ADDITIONAL VESSEL  01/04/2021   IR ANGIOGRAM SELECTIVE EACH ADDITIONAL VESSEL  01/04/2021   IR ANGIOGRAM VISCERAL SELECTIVE  01/04/2021   IR US GUIDE VASC ACCESS RIGHT  01/04/2021   LUMBAR LAMINECTOMY/DECOMPRESSION MICRODISCECTOMY N/A 07/03/2021   Procedure: Lumbar three through five decompression with lumbar three through five insitu fusion;  Surgeon: Melina Schools, MD;  Location: [facility];  Service: Orthopedics;  Laterality: N/A;   MEMBRANE PEEL Left 03/14/2014   Procedure: MEMBRANE PEEL;  ENDOLASER;  Surgeon: Hurman Horn, MD;  Location: [place];  Service: Ophthalmology;  Laterality: Left;   NM MYOVIEW LTD  2011   Neg Ischemia or infarct.   NM MYOVIEW LTD  10/2015   LOW RISK. Small, fixed basal lateral defect - likely diaphragmatic attenuation. EF 69%   PARS PLANA VITRECTOMY Left 03/14/2014   Procedure: PARS PLANA VITRECTOMY WITH 25 GAUGE;  Surgeon: Hurman Horn, MD;  Location: [place];  Service: Ophthalmology;  Laterality: Left;   TONSILLECTOMY     TRANSTHORACIC ECHOCARDIOGRAM  01/07/2021   Normal EF 60 to 65%.  No R WMA.  GRII DD-moderately dilated LA..  Mildly dilated RV but normal function.  Normal RAP/CVP.  Trivial AI with mild to moderate sclerosis-no stenosis   UPPER GASTROINTESTINAL ENDOSCOPY  WEIL OSTEOTOMY Right 09/01/2017   Procedure: RIGHT GREAT TOE CHEVRON AND WEIL OSTEOTOMY 2ND METATARSAL;  Surgeon: Newt Minion, MD;  Location: [facility];  Service: Orthopedics;  Laterality: Right;    FAMILY HISTORY Family History  Problem Relation Age of Onset   Heart disease Mother    Cancer Paternal Grandfather    Cancer Sister     SOCIAL HISTORY Social History   Tobacco Use   Smoking status: Former    Types: Cigars    Quit date: 07/27/1988    Years since quitting: 33.0   Smokeless tobacco: Never  Vaping Use   Vaping Use: Never used  Substance Use Topics   Alcohol use: Yes    Alcohol/week: 3.0 standard drinks    Types: 1 Glasses of wine, 1 Cans of beer, 1 Shots of liquor per week    Comment: occasional   Drug use: No         OPHTHALMIC EXAM:  Base Eye Exam     Visual Acuity (ETDRS)       Right Left   Dist cc CF at 3' 20/20    Correction: Glasses         Tonometry (Tonopen, 9:33 AM)       Right Left   Pressure 17 15         Pupils       Dark Light Shape React APD   Right 6 6 Irregular  +1   Left 4 3 Round Slow None         Extraocular Movement       Right Left    Full Full         Neuro/Psych     Oriented x3: Yes    Mood/Affect: Normal         Dilation     Both eyes: No dilation            Slit Lamp and Fundus Exam     External Exam       Right Left   External Normal Normal         Slit Lamp Exam       Right Left   Lids/Lashes Normal Normal   Conjunctiva/Sclera White and quiet White and quiet   Cornea Slight thickening, no corneal touch with tube, mostly clear Clear   Anterior Chamber Tube, Tube with  NO corneal touch, mild corneal thickening, mild corneal edema no change not visual acuity limiting Deep and quiet   Iris Round and reactive Round and reactive   Lens Anterior chamber intraocular lens Posterior chamber intraocular lens   Anterior Vitreous Normal Normal         Fundus Exam       Right Left   Posterior Vitreous Vitrectomized, clear Vitrectomized   Disc 2+ Pallor temporal 1+ Pallor temporal   C/D Ratio 0.75 0.55   Macula Diffuse atrophy, no macular thickening Microaneurysms, no macular thickening   Vessels Old, RVO old vasculitis now inactive Old macular BRVO, inactive   Periphery Good PRP Good PRP sector, no retinal holes or tears            IMAGING AND PROCEDURES  Imaging and Procedures for 07/29/21           ASSESSMENT/PLAN:  Neovascular glaucoma, right eye, stage unspecified Stabilized OD, chronic residual synechia acute angle-closure but no active NVE  Primary open angle glaucoma of left eye, mild stage OS, stable IOP on current medical therapy  I reassured patient that  Artane will not have affect on his open-angle glaucoma  Secondary corneal edema of right eye Likely due to low perfusion status of the ciliary body, stable overall, no tube touched to the corneal endothelium  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 Neovascular glaucoma, right eye, stage unspecified  H40.51X0     2. Primary open angle glaucoma of left eye, mild stage  H40.1121     3. Secondary corneal edema of right eye  H18.231     4. 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  OU, stable IOP, stable condition will observe  2.  Monitor for follow-up and patient to contact the office for any new onset profound visual acuity changes particularly the left eye  3.  Continue on topical medications  Ophthalmic Meds Ordered this visit:  No orders of the defined types were placed in this encounter.      Return in about 3 months (around 10/27/2021) for DILATE OU, OCT, COLOR FP.  There are no Patient Instructions on file for this visit.   Explained the diagnoses, plan, and follow up with the patient and they expressed understanding.  Patient expressed understanding of the importance of proper follow up care.   Clent Demark Linnell Swords M.D. Diseases & Surgery of the Retina and Vitreous [facility] [place] 07/29/21     Abbreviations: M myopia (nearsighted); A astigmatism; H hyperopia (farsighted); P presbyopia; Mrx spectacle prescription;  CTL contact lenses; OD right eye; OS left eye; OU both eyes  XT exotropia; ET esotropia; PEK punctate epithelial keratitis; PEE punctate epithelial erosions; DES dry eye syndrome; MGD meibomian gland dysfunction; ATs artificial tears; PFAT's preservative free artificial tears; [place] nuclear sclerotic cataract; PSC posterior subcapsular cataract; ERM epi-retinal membrane; PVD posterior vitreous detachment; RD retinal detachment; DM diabetes mellitus; DR diabetic retinopathy; NPDR non-proliferative diabetic retinopathy; PDR proliferative diabetic retinopathy; CSME clinically significant macular edema; DME diabetic macular edema; dbh dot blot hemorrhages; CWS cotton wool spot; POAG primary open angle glaucoma; C/D cup-to-disc ratio; HVF humphrey visual field;  GVF goldmann visual field; OCT optical coherence tomography; IOP intraocular pressure; BRVO Branch retinal vein occlusion; CRVO central retinal vein occlusion; CRAO central retinal artery occlusion; BRAO branch retinal artery occlusion; RT retinal tear; SB scleral buckle; PPV pars plana vitrectomy; VH Vitreous hemorrhage; PRP panretinal laser photocoagulation; IVK intravitreal kenalog; VMT vitreomacular traction; MH Macular hole;  NVD neovascularization of the disc; NVE neovascularization elsewhere; AREDS age related eye disease study; ARMD age related macular degeneration; POAG primary open angle glaucoma; EBMD epithelial/anterior basement membrane dystrophy; ACIOL anterior chamber intraocular lens; IOL intraocular lens; PCIOL posterior chamber intraocular lens; Phaco/IOL phacoemulsification with intraocular lens placement; [place] photorefractive keratectomy; LASIK laser assisted in situ keratomileusis; HTN hypertension; DM diabetes mellitus; COPD chronic obstructive pulmonary disease

## 2021-08-02 ENCOUNTER — Encounter: Payer: Self-pay | Admitting: Family Medicine

## 2021-08-04 ENCOUNTER — Telehealth: Payer: Self-pay

## 2021-08-04 ENCOUNTER — Other Ambulatory Visit (HOSPITAL_COMMUNITY): Payer: Self-pay

## 2021-08-04 NOTE — Telephone Encounter (Signed)
Pt. Called back to schedule an apt. For a hearing test. She wanted to make sure that Dr. Redmond School could write a prescription for a hearing aid after he has the hearing test here. They need to fax a prescription to his ins. Company and they will send him a hearing aid that is covered by his ins. His apt. Is tomorrow afternoon at 4:15. If you could please let her know before then or does he need to see an audiologists?

## 2021-08-05 ENCOUNTER — Ambulatory Visit (INDEPENDENT_AMBULATORY_CARE_PROVIDER_SITE_OTHER): Payer: Medicare Other | Admitting: Family Medicine

## 2021-08-05 ENCOUNTER — Other Ambulatory Visit: Payer: Self-pay

## 2021-08-05 ENCOUNTER — Encounter: Payer: Self-pay | Admitting: Family Medicine

## 2021-08-05 VITALS — BP 110/70 | HR 71 | Temp 96.7°F | Wt 158.4 lb

## 2021-08-05 DIAGNOSIS — H9111 Presbycusis, right ear: Secondary | ICD-10-CM

## 2021-08-05 NOTE — Progress Notes (Signed)
° °  Subjective:    Patient ID: [name], male    DOB: November 13, 1950, 71 y.o.   MRN: [id number]  HPI He is here for hearing evaluation.  He and his wife noted that he does have decreased hearing.   Review of Systems     Objective:   Physical Exam  Alert and in no distress.  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      Assessment & Plan:

## 2021-08-11 ENCOUNTER — Other Ambulatory Visit: Payer: Self-pay

## 2021-08-11 ENCOUNTER — Ambulatory Visit: Payer: Medicare Other | Attending: Family Medicine | Admitting: Audiologist

## 2021-08-11 ENCOUNTER — Other Ambulatory Visit (HOSPITAL_COMMUNITY): Payer: Self-pay

## 2021-08-11 DIAGNOSIS — H903 Sensorineural hearing loss, bilateral: Secondary | ICD-10-CM | POA: Diagnosis not present

## 2021-08-11 NOTE — Procedures (Signed)
[facility], [place]  [id number] [phone]  AUDIOLOGICAL  EVALUATION  NAME: [name]     DOB:   03-25-1951      MRN: [id number]                                                                                     DATE: 08/11/2021     REFERENT: Denita Lung, MD STATUS: Outpatient DIAGNOSIS: Sensorineural Hearing Loss Bilateral     History: [name] was seen for an audiological evaluation. [name] was accompanied to the appointment by his wife.  [name] is receiving a hearing evaluation due to concerns for hearing loss in the right ear. [name] has difficulty hearing in background noise, crowds, and when people are at a distance. His wife has noticed he has to walk closer to her to understand. He cannot hear her from another room. This difficulty began gradually. No pain or pressure reported in either ear. Tinnitus present in both ears in occasion. [name] denies history of noise exposure. Medical history positive for Parkinson's disease. [name] hs previously had a hearing test from [employer] and wore a hearing aid in the right ear only. He is interested in using hearing aids again but wants to go through his [employer] benefit and receive the mail order option. No other relevant case history reported.   Evaluation:  Otoscopy showed a partial view of the tympanic membranes with non-occluding cerumen, bilaterally Tympanometry results were consistent with normal middle ear function, bilaterally   Audiometric testing was completed using conventional audiometry with insert and supraural transducer. Speech Recognition Thresholds were consistent with pure tone averages. SRT 20dB in the right ear and 10dB in the left ear. Word Recognition was good in each ear at an elevated level of 40dB SL. Pure tone thresholds show normal sloping after 2k Hz to a moderate sensorineural hearing loss in both ears. 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  Results:  The test results were reviewed with [name] and his wife. [name] needs hearing aids in both ears. He has good ability to understand clear speech at conversational level on testing today. He is missing only a few high frequency spec sounds such as /s/ /th/ and /f/. More important than hearing aids are good communication habits. Only expect [name] to understand speech when in the same room at a distance of less than five feet. [name] will understand speech best when the person speaks to him face to face. All the sounds he misses are visible on the face and he will feel that speech is clearer when he can see the speaker's face. A hearing aid trial is recommended to see if they provide benefit. Hearing aids will not allow [name] to hear people from another room. Hearing aids work best face to face within five feet.   Recommendations: Amplification is necessary for both ears. Hearing aids can be purchased from a variety of locations. See provided list for locations in the [place] area. To access hearing aid benefits through [employer], recommend calling the hearing department directly at [phone].    [name]  Audiologist, Au.D., CCC-A 08/11/2021  1:44 PM  Cc: Denita Lung, MD

## 2021-08-15 DIAGNOSIS — Z4889 Encounter for other specified surgical aftercare: Secondary | ICD-10-CM | POA: Diagnosis not present

## 2021-08-15 DIAGNOSIS — H905 Unspecified sensorineural hearing loss: Secondary | ICD-10-CM | POA: Diagnosis not present

## 2021-08-16 ENCOUNTER — Encounter: Payer: Self-pay | Admitting: Neurology

## 2021-08-18 NOTE — Telephone Encounter (Signed)
Seroquel is for agitation. If he is not having the agitation any longer, I do not see a need for continued use of Seroquel.

## 2021-08-26 ENCOUNTER — Other Ambulatory Visit (HOSPITAL_COMMUNITY): Payer: Self-pay

## 2021-08-26 DIAGNOSIS — M5451 Vertebrogenic low back pain: Secondary | ICD-10-CM | POA: Diagnosis not present

## 2021-09-02 DIAGNOSIS — M5451 Vertebrogenic low back pain: Secondary | ICD-10-CM | POA: Diagnosis not present

## 2021-09-05 DIAGNOSIS — M5451 Vertebrogenic low back pain: Secondary | ICD-10-CM | POA: Diagnosis not present

## 2021-09-07 ENCOUNTER — Other Ambulatory Visit: Payer: Self-pay | Admitting: Family Medicine

## 2021-09-07 ENCOUNTER — Other Ambulatory Visit: Payer: Self-pay | Admitting: Neurology

## 2021-09-08 ENCOUNTER — Other Ambulatory Visit (HOSPITAL_COMMUNITY): Payer: Self-pay

## 2021-09-08 MED ORDER — CARBIDOPA-LEVODOPA 25-100 MG PO TABS
ORAL_TABLET | ORAL | 1 refills | Status: DC
  Filled 2021-09-08: qty 405, 90d supply, fill #0

## 2021-09-08 MED ORDER — CITALOPRAM HYDROBROMIDE 20 MG PO TABS
20.0000 mg | ORAL_TABLET | Freq: Every day | ORAL | 1 refills | Status: DC
  Filled 2021-09-08: qty 30, 30d supply, fill #0

## 2021-09-08 NOTE — Telephone Encounter (Signed)
Rx refilled.

## 2021-09-08 NOTE — Telephone Encounter (Signed)
[facility] is requesting to fill pt celexa . Please advise [facility]

## 2021-09-14 ENCOUNTER — Other Ambulatory Visit: Payer: Self-pay | Admitting: Family Medicine

## 2021-09-15 ENCOUNTER — Other Ambulatory Visit (HOSPITAL_COMMUNITY): Payer: Self-pay

## 2021-09-15 MED ORDER — PANTOPRAZOLE SODIUM 40 MG PO TBEC
40.0000 mg | DELAYED_RELEASE_TABLET | Freq: Two times a day (BID) | ORAL | 0 refills | Status: DC
  Filled 2021-09-15: qty 180, 90d supply, fill #0

## 2021-09-16 DIAGNOSIS — M5451 Vertebrogenic low back pain: Secondary | ICD-10-CM | POA: Diagnosis not present

## 2021-09-18 DIAGNOSIS — M5451 Vertebrogenic low back pain: Secondary | ICD-10-CM | POA: Diagnosis not present

## 2021-09-22 DIAGNOSIS — M5451 Vertebrogenic low back pain: Secondary | ICD-10-CM | POA: Diagnosis not present

## 2021-09-24 DIAGNOSIS — M5451 Vertebrogenic low back pain: Secondary | ICD-10-CM | POA: Diagnosis not present

## 2021-09-26 DIAGNOSIS — Z4889 Encounter for other specified surgical aftercare: Secondary | ICD-10-CM | POA: Diagnosis not present

## 2021-09-29 DIAGNOSIS — M5451 Vertebrogenic low back pain: Secondary | ICD-10-CM | POA: Diagnosis not present

## 2021-10-01 DIAGNOSIS — M5451 Vertebrogenic low back pain: Secondary | ICD-10-CM | POA: Diagnosis not present

## 2021-10-02 ENCOUNTER — Encounter: Payer: Self-pay | Admitting: Family Medicine

## 2021-10-03 ENCOUNTER — Encounter: Payer: Self-pay | Admitting: Family Medicine

## 2021-10-03 DIAGNOSIS — R350 Frequency of micturition: Secondary | ICD-10-CM | POA: Diagnosis not present

## 2021-10-06 ENCOUNTER — Encounter: Payer: Self-pay | Admitting: Adult Health

## 2021-10-06 ENCOUNTER — Ambulatory Visit: Payer: Medicare Other | Admitting: Adult Health

## 2021-10-06 ENCOUNTER — Other Ambulatory Visit (HOSPITAL_COMMUNITY): Payer: Self-pay

## 2021-10-06 VITALS — BP 131/87 | HR 63 | Ht 70.0 in | Wt 160.0 lb

## 2021-10-06 DIAGNOSIS — G3184 Mild cognitive impairment, so stated: Secondary | ICD-10-CM | POA: Diagnosis not present

## 2021-10-06 DIAGNOSIS — G20C Parkinsonism, unspecified: Secondary | ICD-10-CM

## 2021-10-06 DIAGNOSIS — G20 Parkinson's disease: Secondary | ICD-10-CM

## 2021-10-06 MED ORDER — DONEPEZIL HCL 10 MG PO TABS
10.0000 mg | ORAL_TABLET | Freq: Every day | ORAL | 5 refills | Status: DC
  Filled 2021-10-06: qty 30, 30d supply, fill #0

## 2021-10-06 MED ORDER — DONEPEZIL HCL 5 MG PO TABS
5.0000 mg | ORAL_TABLET | Freq: Every day | ORAL | 0 refills | Status: DC
  Filled 2021-10-06: qty 30, 30d supply, fill #0

## 2021-10-06 NOTE — Progress Notes (Signed)
[facility] PATIENT: [name] DOB: Dec 31, 1950   REASON FOR VISIT: Follow-up for tremor mild cognitive impairment, depression HISTORY FROM: Patient and wife   Chief Complaint  Patient presents with   Follow-up    RM 3 with spouse [name]  Pt is well, states tremors have been fine but short term memory and recall is not the best.      HISTORY OF PRESENT ILLNESS:HISTORY:63 year African American male who since last year and a half has noticed increasing tremors mainly in the left arm and leg and to a lesser degree in the right arm as well. He states the tremors were quite mild but became more pronounced after a minor accident while staying [place] the [place] in [place]. He fell down 12 flights of steps and hit a concrete slab and had a concussion. He and some minor bruises and knee injury which took several months to reck of her period CT scan of the head was done 2 days later which was unremarkable. Since then he has had some walking difficulty but this may be related to his knee pain. He says that his feet do at times gets stuck in his started walking with a stooped posture. He however does not describe typical festination. He has resting and intermittent action tremor claiming the left arm and leg the tremor does improve with activities. The tremor does not seem to interfere with most of his routine. He denies significant bradykinesia, and drooling of saliva or micrographia. He has been evaluated by neurologists Dr. Reginia Forts at [facility] neurology but he is unable to tell me for the diagnosis was. He was not told that this may be Parkinson's and was not tried on dopaminergic medications. He did have an MRI scan and some lab work but I do not have those results for my review available today. Patient is here today for a second opinion as he feels his tremor is not getting better. He does admit to some light masonry hallucinations as well as restless sleep  thrashing of his legs. He denies significant memory or cognitive difficulties. He has not noticed any particular effect of alcohol on his tremor. Is no family history of tremors. Patient does have chronic hepatitis C and is planning to start treatment with dapsone. He has had no seizures, significant head injury with major loss of consciousness, stroke, TIAs or significant neurological problems.  Update 08/14/2014 : He returns for follow-up after last visit 3 months ago. He feels his tremors are about the same. He had tried reducing the dose of Sinemet but noticed that his tremors got worse and hence he went back up to the original dose which is 25/100 one tablet 3 times daily. He still feels occasionally confused and at times secondary to some cells but he admits that he worries a lot. He also admits to feeling depressed, getting tired easily and not having initiated. He has not been on any medications for depression. Patient denies significant drooling of saliva, bradykinesia, gait or balance problems. He feels his tremor is mild and does not interfere with his activities of daily living. I discussed alternative treatment options including addition of dopamine agonist, anticholinergic or even consideration for deep brain  stimulation since his tremor is predominantly unilateral however the patient does not want to consider more aggressive treatment options at the present time. UPDATE 12/26/14 [name], 71 year old black male returns for follow-up. He was last seen by Dr. [name] 08/14/2014. He feels his tremors  are better since increasing his carbidopa levodopa to 1-1/2 tablets 3 times daily. He is tolerating it well without dizziness, sleepiness, nightmares or hallucinations A MRI Brain with and without Contrast completed on 07/29/2011 was normal.  The Mini-Mental status exam today is unchanged 29 out of 30. He did not follow-up with his primary care for  complaints of depression. He denies any significant  drooling bradykinesia falls or balance problems. His tremor does not interfere with his activities of daily living. He returns for reevaluation Update 07/03/2015 : He returns for follow-up after last visit 6 months ago. He states his tremors continue to do well and he seems to have responded quite well to increased dose of Sinemet 25/100 1-1/2 tablet 3 times daily. Is tolerating it well without any dizziness, sleepiness, nightmares, hallucinations, nausea or diarrhea. He continues to have mild memory difficulties but his has not been on any treatment for depression and in fact has not seen his primary care physician for the same for quite some time. Update 08/11/2016 PS : He returns for follow-up after last visit year ago. He states his tremors as well as memory loss or more or less unchanged. Continues to have intermittent tremors in the left hand mainly. The tremors fluctuate he has good days and bad days. He is currently taking Sinemet 25/100 one and half tablet 3 times daily. He complains of mild fatigue and daytime sleepiness. He denies hallucinations, delusions, dizziness or upset stomach. He does not want to try higher dose because of fear of side effects. Continues to have short-term memory difficulties. He has not been quite compulsive about taking notes and using memory compensation strategies but plans to do so. He has no new complaints today. UPDATE 07/19/2018CM [name], 71 year old male returns for follow-up with tremors and mild cognitive impairment which he says is stable. He continues to have a resting tremor in the left hand which is intermittent. He continues to have good and bad days. He remains on Sinemet 25 100 (1 and half tablets) 3 times daily. He does little exercise he is not doing any memory compensation strategies at this time and was encouraged to do so. He denies any falls. He denies any hallucinations or increased confusion. He denies any freezing spells. He does complain of left hip  pain He returns for reevaluation UPDATE 1/22/2019CM [name], 30 he denies 85-year-old male returns for follow-up with a history of Parkinson's disease.  He continues to have mild resting tremor in the left hand which is intermittent.  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  He remains on Sinemet 3 times a day however he is taking his medication with a meal.  He complains of sometimes being lightheaded when he stands up too quickly.  He was encouraged to sit on the side of the bed and then get up slightly.  He was also encouraged to stay well-hydrated.  He has not been exercising.  He denies any hallucinations or increased confusion.  He has not had any freezing spells.  He returns for reevaluation 02/15/18 UPDATE:JV  Patient returns today for six-month follow-up.  He continues to take Sinemet 3 times daily with the first dose around 6 AM (1 hour prior to eating breakfast), second dose 11 AM (1 hour prior to eating lunch) and then third dose between 5:30 and 6 PM (eats dinner around 6:30-7 PM).  Patient denies any increase in symptoms between doses but does state if he is late on a dose, he will  start having increased tremors in his left upper extremity and left lower extremity.  He continues to complain of short-term memory issue and is unable to state whether it is worsening but he states he notices it more.  He did obtain a large book full avoid games but does this approximately 1 time per week.  Recommended to do these activities at least once daily.  Patient also has complaints of feeling as though he overanalyzes certain situations and feeling increased fatigue.  Patient does endorse increased stress recently but does feel somewhat situational.  Provided patient with stress relaxation techniques and advised him if this continues, to follow-up with PCP for possible medication management.  He denies any hallucinations or increased confusion.  He denies any freezing spells. UPDATE 1/29/2020CM Mr. Debruin,  71 year old male returns for follow-up with a history of Parkinson's disease and mild cognitive impairment.  He denies any freezing spells any stiffness any falls.  He denies any difficulty swallowing.  He has spinal stenosis and had recent injection.  He is seeing Dr. Sharol Given for a foot problem.  He is currently on Sinemet 3 times a day before meals.  He has not been exercising and was encouraged to do so.  He has not had any hallucinations or increased confusion.  Noted very intermittent left tremor of the hand.  Patient reports some anxiety in public.  He was encouraged to perform stress relaxation techniques.  He returns for reevaluation  : Update 02/23/2019 :  he returns for follow-up after last visit 6 months ago.  He states his tremors are doing well until a month ago when he is noticed worsening of his tremor both at rest as well as using his hands.  He remains on Sinemet 25/100 mg 1-1/2 tablet 3 times daily.  Is tolerating them well without any nausea, diarrhea, dizziness, sleepiness or hallucinations.  He states his short-term memory difficulties are unchanged.  He has not been socializing a lot or being active but plans to do that soon.  He has no other complaints.  He denies significant drooling, bradykinesia, stooped posture gait or balance problems Update 09/04/2019 : He returns for follow-up after last visit 6 months ago.  He states that his tremors continue to intermittently not do well.  He has no more bad days than good days.  Has noticed some tremor in his legs as well intermittently.  Patient was advised to try higher dose of Sinemet 2 tablets 3 times daily at last visit.  He states he was not able to tolerate this because of dizziness and had to cut back to 1-1/2 3 times daily which is tolerating better.  He is also noticed that his balance is off but he blames this on having some problems in his toes.  He has had no major falls or injuries.  He can still get up easily from his chair.  He denies  significant drooling of saliva.  He does have some at times disturbed sleep and reviewed dreams and talks in his sleep.  Gets startled easily. Update 01/10/2020 : He returns for follow-up after last visit 4 months ago.  Is accompanied by his wife.  The patient was started on Azilect at last visit but had trouble tolerating it daily due to sleepiness, tiredness and dizziness.  He has since switched to using Azilect 0.5 mg twice daily every other day and seems to be tolerating it better.  However his tremors still persists and appear to get worse mostly when he  is anxious or with severe exertion.  He feels the tremor are not particularly disabling and he can handle it.  Continues to have poor short-term memory and needs to write things down but still remembers only occasionally.  Sometimes he may remember later.  Wife notes that occasionally he may pause in midsentence and searches for words.  At times even starts stuttering as he is trying to find the word.  He has no family history of Alzheimer's or dementia.  Still fairly independent and manages most activities for himself.  He remains currently on Sinemet 25/100 1.5 tablets 4 times daily which is tolerating well without significant nightmares, hallucinations, daytime sleepiness or nausea. Update 06/28/2020 : He returns for follow-up after last visit 6 months ago.  He is accompanied by his wife.  He continues to have intermittent tremors mostly in the left hand which are bothersome and he has good days and bad days.  Patient still continues to take Artane 2 mg tablets only half a tablet twice daily on alternate days and not every day as instructed at last visit.  He remains on Sinemet 25/100 mg 1.5 tablets 3 times daily and he has had trouble tolerating higher doses in the past.  Is not having significant dizziness or upset stomach but is still having some disturbed sleep with crazy dreams.  He denies hallucinations or delusions.  He is careful with his walking and  has had a few near falls but no major falls or injuries.  He denies excessive drooling of saliva or bradykinesia or mobility issues.  He does admit to decreased short-term memory, attention and concentration difficulties but these appear to be stable and unchanged. Update 10/01/2020 Dr. [name]: He returns for follow-up after last visit 3 months ago.  Patient did not tolerate increasing the dose of Artane to 2 mg daily as he developed increased dryness of mouth as well as bad dreams.  He has since reduced the dose back to 1 mg daily and is feeling better.  He still has intermittent resting tremor in both left hand as well as leg which does interfere with his activities of daily living slightly.  He remains on Sinemet 25/100 mg 1.5 tablets 3 times daily which is tolerating quite well without any dizziness, sleepiness, upset stomach, nightmares or hallucinations.  He notices occasional lip twitchings in the morning only.  He has no bradykinesia or increased drooling.  He can get up easily and to most of his activities without help.  He continues to have short-term memory problems which appear unchanged.  He forgets recent appointments and events.  He states he has decreased hearing bilaterally which is also seems to complicate his understanding and memory.  He is however quite independent in activities of daily living.  Update 04/07/2021 JM: Returns for 75-monthfollow-up after prior visit with Dr. SLeonie Man  He is accompanied by his wife, BPamala Maxwell Since prior visit, was hopitalized back in June for GI bleed.  He developed acute confusion most likely hospital delirium requiring short course of Precedex. Per wife, she is concerned regarding worsening cognition with short term memory and delayed recall since that time as well as multiple other concerns including worsening lower back pain, decreased hearing and vision and chills/coldness sensation in both feet and hands. Tremors have been stable with ongoing use of Sinemet and  Artane.  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 Continues to struggle with short-term  memory and recall which he feels has continued to gradually, slowly decline. Examples he gives includes walking into a room and forget why he went into room or repeat similar questions. Did recently receive hearing aide for left ear and continued use of hearing aide in right ear. At times he will feel like he is in a "fog" typically associated with lightheadedness, is aware these are happening, does not lose consciousness. Per wife, this has been improving as she has been trying to encourage him to drink more water.  Will feel down or depressed due to his memory issues but otherwise denies depression. Does do memory exercises occasionally at home.  Is currently doing PT after back surgery - does report great improvement of pain since procedure. Did have post anesthesia delirium requiring short-term use of Seroquel but this has since resolved. Wife is concerned as he needs to have surgical procedure for umbilical hernia and fears recurrence of delirium. No behavioral concerns such as hallucinations, agitation, sleep difficulty, or confusion.  Remains on Cerefolin but does not believe it has been that beneficial. MMSE today 27/30 (prior 26/30)  Tremors stable with ongoing use of Sinemet and Artane, tolerating without side effects. Denies any issues with drooling, freezing, gait impairement, swallowing difficulties or bradykinesia.  No further concerns at this time      REVIEW OF SYSTEMS: Full 14 system review of systems performed and notable only for those listed in HPI only and all other systems negative   ALLERGIES: No Known Allergies  HOME MEDICATIONS: Outpatient Medications Prior to Visit  Medication Sig Dispense Refill   alfuzosin (UROXATRAL) 10 MG 24 hr tablet TAKE 1 TABLET BY MOUTH ONCE A DAY 90 tablet 3   carbidopa-levodopa  (SINEMET IR) 25-100 MG tablet TAKE 1 AND 1/2 TABLETS BY MOUTH THREE TIMES DAILY 405 tablet 1   citalopram (CELEXA) 20 MG tablet Take 1 tablet by mouth daily. 30 tablet 1   docusate sodium (COLACE) 100 MG capsule Take 100 mg by mouth daily.     FIBER ADULT GUMMIES PO Take 1 tablet by mouth 2 (two) times daily.     irbesartan (AVAPRO) 75 MG tablet Take 1 tablet (75 mg total) by mouth every morning. 90 tablet 1   L-Methylfolate-B12-B6-B2 (CEREFOLIN) 12-25-48-5 MG TABS Take 1 tablet by mouth daily. 90 tablet 3   latanoprost (XALATAN) 0.005 % ophthalmic solution Place 1 drop into the left eye daily. 2.5 mL 8   Multiple Vitamins-Minerals (MULTIVITAMIN ADULTS 50+ PO) Take 1 tablet by mouth daily.     nitroGLYCERIN (NITROSTAT) 0.4 MG SL tablet Place 1 tablet (0.4 mg total) under the tongue every 5 (five) minutes as needed for chest pain. 25 tablet 2   pantoprazole (PROTONIX) 40 MG tablet Take 1 tablet (40 mg total) by mouth 2 (two) times daily. 180 tablet 0   simvastatin (ZOCOR) 40 MG tablet TAKE 1 TABLET BY MOUTH AT BEDTIME (Patient taking differently: Take by mouth at bedtime. ON HOLD) 90 tablet 3   timolol (TIMOPTIC) 0.5 % ophthalmic solution Instill 1 drop into both eyes every morning 30 mL 4   trihexyphenidyl (ARTANE) 2 MG tablet Take 1/2 tablet (1 mg total) by mouth 2 (two) times daily with a meal. 30 tablet 5   verapamil (CALAN-SR) 120 MG CR tablet Take 1 tablet (120 mg total) by mouth daily. 90 tablet 1   QUEtiapine (SEROQUEL) 25 MG tablet Take 1 tablet by mouth daily during the day and two tablets at night (Patient not taking:  Reported on 07/24/2021) 90 tablet 0   timolol (TIMOPTIC) 0.5 % ophthalmic solution Instill 1 drop into both eyes every morning (Patient not taking: Reported on 10/06/2021) 30 mL 4   timolol (TIMOPTIC) 0.5 % ophthalmic solution Place 1 drop into both eyes daily. (Patient not taking: Reported on 10/06/2021) 10 mL 6   No facility-administered medications prior to visit.    PAST  MEDICAL HISTORY: Past Medical History:  Diagnosis Date   Allergy    Arthritis    BPH (benign prostatic hyperplasia)    Complication of anesthesia    Pt. stated he had a reaction that ended in him requiring urinary cath placement   Diverticulosis    GERD (gastroesophageal reflux disease)    esophageal spasms   Glaucoma    Gout    Head injury, closed, with concussion    Hepatitis C    chronic - Has been treated with Harvoni   HLD (hyperlipidemia)    statin intolerant (Crestor & Simvastatin) - Taking Livalo '1mg'$  / week   Hypertension    Parkinson's disease ([place])    Plantar fasciitis    right   PVD (peripheral vascular disease) ([place])    With no claudication; only mild abdominal aortic atherosclerosis noted on ultrasound.   Spinal stenosis of lumbar region    Thoracic ascending aortic aneurysm    4.2 cm ascending TAA 09/2016 CT, 1 yr f/u rec   Ulcer     PAST SURGICAL HISTORY: Past Surgical History:  Procedure Laterality Date   BUNIONECTOMY WITH WEIL OSTEOTOMY Right 11/02/2019   Procedure: Right Foot Lapidus, Modified McBride Bunionectomy,  Hallux Akin Osteotomy;  Surgeon: Wylene Simmer, MD;  Location: [facility];  Service: Orthopedics;  Laterality: Right;   CARDIAC CATHETERIZATION  2005   30% Cx. Dr. Melvern Banker   cataract surgery Left 11/05/2014   COLONOSCOPY     COLONOSCOPY N/A 01/09/2021   Procedure: COLONOSCOPY;  Surgeon: Ronnette Juniper, MD;  Location: [facility];  Service: Gastroenterology;  Laterality: N/A;   ESOPHAGOGASTRODUODENOSCOPY N/A 05/02/2015   Procedure: ESOPHAGOGASTRODUODENOSCOPY (EGD);  Surgeon: Ronald Lobo, MD;  Location: [facility];  Service: Endoscopy;  Laterality: N/A;   ESOPHAGOGASTRODUODENOSCOPY  05/02/2015   no source of pt chest pain endoscopically evident. small hiatal hernia.   ESOPHAGOGASTRODUODENOSCOPY (EGD) WITH PROPOFOL N/A 01/02/2021   Procedure: ESOPHAGOGASTRODUODENOSCOPY (EGD) WITH PROPOFOL;  Surgeon: Clarene Essex, MD;  Location: [facility];  Service: Endoscopy;  Laterality: N/A;   EYE SURGERY     HARDWARE REMOVAL Right 11/02/2019   Procedure: Second Metatarsal Removal of Deep Implant and Rotational Osteotomy;  Surgeon: Wylene Simmer, MD;  Location: [place];  Service: Orthopedics;  Laterality: Right;   IR ANGIOGRAM SELECTIVE EACH ADDITIONAL VESSEL  01/04/2021   IR ANGIOGRAM SELECTIVE EACH ADDITIONAL VESSEL  01/04/2021   IR ANGIOGRAM VISCERAL SELECTIVE  01/04/2021   IR US GUIDE VASC ACCESS RIGHT  01/04/2021   LUMBAR LAMINECTOMY/DECOMPRESSION MICRODISCECTOMY N/A 07/03/2021   Procedure: Lumbar three through five decompression with lumbar three through five insitu fusion;  Surgeon: Melina Schools, MD;  Location: [facility];  Service: Orthopedics;  Laterality: N/A;   MEMBRANE PEEL Left 03/14/2014   Procedure: MEMBRANE PEEL; ENDOLASER;  Surgeon: Hurman Horn, MD;  Location: [facility];  Service: Ophthalmology;  Laterality: Left;   NM MYOVIEW LTD  2011   Neg Ischemia or infarct.   NM MYOVIEW LTD  10/2015   LOW RISK. Small, fixed basal lateral defect - likely diaphragmatic attenuation. EF 69%   PARS  PLANA VITRECTOMY Left 03/14/2014   Procedure: PARS PLANA VITRECTOMY WITH 25 GAUGE;  Surgeon: Hurman Horn, MD;  Location: [place];  Service: Ophthalmology;  Laterality: Left;   TONSILLECTOMY     TRANSTHORACIC ECHOCARDIOGRAM  01/07/2021   Normal EF 60 to 65%.  No R WMA.  GRII DD-moderately dilated LA..  Mildly dilated RV but normal function.  Normal RAP/CVP.  Trivial AI with mild to moderate sclerosis-no stenosis   UPPER GASTROINTESTINAL ENDOSCOPY     WEIL OSTEOTOMY Right 09/01/2017   Procedure: RIGHT GREAT TOE CHEVRON AND WEIL OSTEOTOMY 2ND METATARSAL;  Surgeon: Newt Minion, MD;  Location: [facility];  Service: Orthopedics;  Laterality: Right;    FAMILY HISTORY: Family History  Problem Relation Age of Onset   Heart disease Mother    Cancer Paternal Grandfather    Cancer Sister     SOCIAL HISTORY: Social History    Socioeconomic History   Marital status: Married    Spouse name: barbra gen   Number of children: 3   Years of education: AS   Highest education level: Not on file  Occupational History   Occupation: self employed    Employer: [employer]    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  Tobacco Use   Smoking status: Former    Types: Cigars    Quit date: 07/27/1988    Years since quitting: 33.2   Smokeless tobacco: Never  Vaping Use   Vaping Use: Never used  Substance and Sexual Activity   Alcohol use: Yes    Alcohol/week: 3.0 standard drinks    Types: 1 Glasses of wine, 1 Cans of beer, 1 Shots of liquor per week    Comment: occasional   Drug use: No   Sexual activity: Yes  Other Topics Concern   Not on file  Social History Narrative   Lives with wife    Right handed   Drinks 1-2 cups caffeine daily   Social Determinants of Health   Financial Resource Strain: Low Risk    Difficulty of Paying Living Expenses: Not very hard  Food Insecurity: Not on file  Transportation Needs: No Transportation Needs   Lack of Transportation (Medical): No   Lack of Transportation (Non-Medical): No  Physical Activity: Not on file  Stress: Not on file  Social Connections: Not on file  Intimate Partner Violence: Not on file     PHYSICAL EXAM  Vitals:   10/06/21 1426  BP: 131/87  Pulse: 63  Weight: 160 lb (72.6 kg)  Height: '5\' 10"'$  (1.778 m)    Body mass index is 22.96 kg/m.  Generalized: Very pleasant elderly African-American male in no acute distress   Head: normocephalic and atraumatic  Neck: Supple,  Musculoskeletal: No deformity  Neurological examination   Mentation: Awake and alert.  Fluent speech and language. MMSE - Mini Mental State Exam 10/06/2021 04/07/2021 08/24/2018  Orientation to time '5 5 5  '$ Orientation to Place '5 5 5  '$ Registration '3 3 3  '$ Attention/ Calculation '5 5 5  '$ Recall '2 1 2  '$ Language- name 2 objects '2 2 2  '$ Language- repeat '1 1 1  '$ Language- follow 3 step command '1 2  3  '$ Language- read & follow direction '1 1 1  '$ Write a sentence '1 1 1  '$ Write a sentence-comments - - no subject  Copy design 1 0 1  Total score '27 26 29   '$ Cranial nerve II-XII: Pupils were equal round reactive to light extraocular movements were full, visual field were full  on confrontational test. Facial sensation and strength were normal.  Hearing aids bilaterally.  Uvula tongue midline. head turning and shoulder shrug were normal and symmetric.Tongue protrusion into cheek strength was normal. Motor: normal bulk and tone, full strength in the BUE, BLE, .  Diminished amplitude of finger tapping on the left, intermittent left hand and leg resting tremor, mild cogwheel rigidity upon activation at the left wrist. Tremor improves with action and intention.  No bradykinesia bilaterally. Unable to appreciate tremor in RUE although very slight cogwheel rigidity observed Sensory: normal and symmetric to light touch, on the face arms and legs  Coordination: finger-nose-finger, heel-to-shin bilaterally, no dysmetria Reflexes: 1+ upper lower and symmetric plantar responses were flexor bilaterally. Gait and Station: Rising up from seated position without assistance, no festination or stooped posture.  Ambulated well with moderate stride, decreased arm swing on the left., smooth turning, no assistive device.     DIAGNOSTIC DATA (LABS, IMAGING, TESTING) - I reviewed patient records, labs, notes, testing and imaging myself where available.  Lab Results  Component Value Date   WBC 9.4 07/08/2021   HGB 10.5 (L) 07/08/2021   HCT 31.0 (L) 07/08/2021   MCV 88 07/08/2021   PLT 244 07/08/2021      Component Value Date/Time   NA 139 07/08/2021 1123   K 4.5 07/08/2021 1123   CL 102 07/08/2021 1123   CO2 29 07/08/2021 1123   GLUCOSE 133 (H) 07/08/2021 1123   GLUCOSE 92 06/30/2021 1430   BUN 31 (H) 07/08/2021 1123   CREATININE 0.98 07/08/2021 1123   CREATININE 1.06 06/29/2014 0920   CALCIUM 9.6 07/08/2021  1123   PROT 6.8 07/08/2021 1123   ALBUMIN 4.4 07/08/2021 1123   AST 32 07/08/2021 1123   ALT 8 07/08/2021 1123   ALKPHOS 66 07/08/2021 1123   BILITOT 0.8 07/08/2021 1123   GFRNONAA >60 06/30/2021 1430   GFRAA 68 09/10/2020 1058   Lab Results  Component Value Date   CHOL 76 01/07/2021   HDL 35 (L) 01/07/2021   LDLCALC 34 01/07/2021   TRIG 36 01/07/2021   CHOLHDL 2.2 01/07/2021   Lab Results  Component Value Date   TSH 3.131 01/11/2021     ASSESSMENT AND PLAN 71 y.o. year old male  has a medical history of resting left arm and leg tremors with mild features of early left sided Parkinson's disease initially present in 2014. He also complains of mild cognitive impairment    1.  Parkinsonian tremor -Stable -Continue Sinemet and Artane at current dosage -Declines interest in deep brain stimulation   2.  MCI -subjective difficult with short term memory - discussed routinely doing memory exercises and ensuring routine physical activity/exercise, ensuring adequate water intake, healthy diet, good sleep and managing stress levels.  -MMSE today 27/30 (prior 26/30) -due to subjective complaints, will start Aricept '5mg'$  nightly for 4 weeks then increase to '10mg'$  nightly.  Discussed potential side effects and to call with any concerns -can stop Cerefolin due to no noticeable benefit and high co-pay     Follow-up in 6 months or call earlier if needed     CC:  Denita Lung, MD   I spent 34 minutes of face-to-face and non-face-to-face time with patient and wife.  This included previsit chart review, lab review, study review, order entry, electronic health record documentation, patient education and discussion regarding parkinsonian tremor and current use of medications, MCI with review and discussion of MMSE and further treatment options and answered all  other questions to patient and wife satisfaction  Frann Rider, [facility]  [facility] [address]  [place], [place] [address]  Phone [phone] Fax [phone] Note: This document was prepared with digital dictation and possible smart phrase technology. Any transcriptional errors that result from this process are unintentional.

## 2021-10-06 NOTE — Patient Instructions (Addendum)
Your Plan: ? ?Start Aricept '5mg'$  nightly for 4 weeks then increase to '10mg'$  nightly thereafter  ? ?Okay to stop Cerefolin at this time ? ?Continue Sinemet and Artane at current dosages  ? ?Ensure you increase your water intake to at least 64 oz of water per day, you can also mix in poweraide zero to help replenish electrolytes  ? ? ? ? ?Follow up in 6 months or call earlier if needed ? ? ? ? ? ?Thank you for coming to see [place] at [facility]. I hope we have been able to provide you high quality care today. ? ?You may receive a patient satisfaction survey over the next few weeks. We would appreciate your feedback and comments so that we may continue to improve ourselves and the health of our patients. ? ?

## 2021-10-07 DIAGNOSIS — M5451 Vertebrogenic low back pain: Secondary | ICD-10-CM | POA: Diagnosis not present

## 2021-10-08 ENCOUNTER — Other Ambulatory Visit (HOSPITAL_COMMUNITY): Payer: Self-pay

## 2021-10-08 ENCOUNTER — Other Ambulatory Visit: Payer: Self-pay | Admitting: *Deleted

## 2021-10-08 ENCOUNTER — Telehealth: Payer: Self-pay | Admitting: Adult Health

## 2021-10-08 ENCOUNTER — Telehealth: Payer: Self-pay | Admitting: Pharmacist

## 2021-10-08 ENCOUNTER — Telehealth: Payer: Self-pay | Admitting: Cardiology

## 2021-10-08 DIAGNOSIS — H401132 Primary open-angle glaucoma, bilateral, moderate stage: Secondary | ICD-10-CM | POA: Diagnosis not present

## 2021-10-08 DIAGNOSIS — H4720 Unspecified optic atrophy: Secondary | ICD-10-CM | POA: Diagnosis not present

## 2021-10-08 DIAGNOSIS — H31093 Other chorioretinal scars, bilateral: Secondary | ICD-10-CM | POA: Diagnosis not present

## 2021-10-08 DIAGNOSIS — Z961 Presence of intraocular lens: Secondary | ICD-10-CM | POA: Diagnosis not present

## 2021-10-08 DIAGNOSIS — H01021 Squamous blepharitis right upper eyelid: Secondary | ICD-10-CM | POA: Diagnosis not present

## 2021-10-08 DIAGNOSIS — H34832 Tributary (branch) retinal vein occlusion, left eye, with macular edema: Secondary | ICD-10-CM | POA: Diagnosis not present

## 2021-10-08 DIAGNOSIS — H01024 Squamous blepharitis left upper eyelid: Secondary | ICD-10-CM | POA: Diagnosis not present

## 2021-10-08 DIAGNOSIS — H35372 Puckering of macula, left eye: Secondary | ICD-10-CM | POA: Diagnosis not present

## 2021-10-08 DIAGNOSIS — H04123 Dry eye syndrome of bilateral lacrimal glands: Secondary | ICD-10-CM | POA: Diagnosis not present

## 2021-10-08 DIAGNOSIS — H34812 Central retinal vein occlusion, left eye, with macular edema: Secondary | ICD-10-CM | POA: Diagnosis not present

## 2021-10-08 MED ORDER — CARBIDOPA-LEVODOPA 25-100 MG PO TABS: ORAL_TABLET | ORAL | 1 refills | Status: DC

## 2021-10-08 MED ORDER — TRIHEXYPHENIDYL HCL 2 MG PO TABS: 1.0000 mg | ORAL_TABLET | Freq: Two times a day (BID) | ORAL | 5 refills | Status: DC

## 2021-10-08 MED ORDER — DORZOLAMIDE HCL-TIMOLOL MAL 2-0.5 % OP SOLN
1.0000 [drp] | Freq: Two times a day (BID) | OPHTHALMIC | 11 refills | Status: AC
  Filled 2021-10-08: qty 10, 50d supply, fill #0

## 2021-10-08 NOTE — Telephone Encounter (Signed)
Prescriptions discontinued at [facility] and sent to [facility].  ?

## 2021-10-08 NOTE — Telephone Encounter (Signed)
Lareisa @ [facility] has called for both: carbidopa-levodopa (SINEMET IR) 25-100 MG tablet & ?trihexyphenidyl (ARTANE) 2 MG tablet to be sent to them, their fax is [phone] ?

## 2021-10-08 NOTE — Telephone Encounter (Signed)
?*  STAT* If patient is at the pharmacy, call can be transferred to refill team. ? ? ?1. Which medications need to be refilled? (please list name of each medication and dose if known)  ?nitroGLYCERIN (NITROSTAT) 0.4 MG SL tablet ? ?2. Which pharmacy/location (including street and city if local pharmacy) is medication to be sent to? [facility] - [place], [place] - [place] [address] ? ?3. Do they need a 30 day or 90 day supply? N/A ? ? ?  ?

## 2021-10-08 NOTE — Chronic Care Management (AMB) (Addendum)
? ? ?Chronic Care Management ?Pharmacy Assistant  ? ?Name: [name]  MRN: [id number] DOB: 1950-08-24 ? ?Referred by: Denita Lung, MD  ?Reason for referral: Chronic care management ? ?Reviewed chart for medication changes ahead of medication coordination call. ? ?BP Readings from Last 3 Encounters:  ?10/06/21 131/87  ?08/05/21 110/70  ?07/08/21 110/74  ?  ?No results found for: HGBA1C  ? ?Verbal consent obtained for [facility] enhanced pharmacy services (medication synchronization, adherence packaging, delivery coordination). A medication sync plan was created to allow patient to get all medications delivered once every 30 to 90 days per patient preference. Patient understands they have freedom to choose pharmacy and clinical pharmacist will coordinate care between all prescribers and [facility]. ? ?Patient requested to obtain medications through Adherence Packaging  30 Days  ? ?Med sync plan: ?Alfuzosin 10 mg  Bjorn Loser, MD 10/08/21  1     #37 Tabs on hand as of 10/03/21 November 09, 2021  ?Verapamil 120 mg x    1     #37 Tabs on hand as of 10/03/21 November 09, 2021  ?Citalopram 20 mg x       1  #14 Tabs on hand as of 10/03/21 October 17, 2021  ?Trihexyphenidyl 2 mg Frann Rider, NP 10/08/21 0.5  0.5   #14 Tabs on hand as of 10/03/21 October 17, 2021  ?Carbidopa-levodopa 25-100 mg Garvin Fila, MD 10/08/20 1.5  1.5 1.5   # 340 Tabs on hand as of 10/03/21 Dec 18, 2021  ?Pantoprazole 40 mg x    1  1   #144 Tabs on hand as of 10/03/21 Dec 14, 2021  ?Nitroglycerin   [name], MD 10/08/21 P R N   Last filled 12/20/20 5 DS October 17, 2021  ?Timolol 0.5 %  Lawernce Pitts, MD 10/08/21    1.5 bottle as of 10/03/21 November 13, 2021  ?Irbesartan 75 mg x    1     #35 Tabs on hand as of 10/03/21 November 07, 2021  ?Latanoprost 0.005%  Lawernce Pitts, MD 10/08/21    .5 bottle as of 10/03/21 October 17, 2021  ? ? ? ?Patient will need a short fill of (med), prior to adherence delivery. (To align with sync  date or if PRN med) ? ?Prescriptions requested from PCP and specialists as follows: ? ?Alfuzosin 10 Mg ?Bjorn Loser, MD  ?Phone:  [phone]  ?  ?trihexyphenidyl (ARTANE) 2 MG  ?carbidopa-levodopa (SINEMET IR) 25-100 MG ?Frann Rider, NP Garvin Fila, MD  ?Phone:  [phone]  ? ?nitroGLYCERIN (NITROSTAT) 0.4 MG  ?[name], MD  ?Phone:  [phone]  ? ?timolol (TIMOPTIC) 0.5 % ?Lawernce Pitts, MD  ?Phone:  [phone] ? ?latanoprost (XALATAN) 0.005  ?Hurman Horn, MD  ?Phone:  [phone]    ? ? ?Medications: ?Outpatient Encounter Medications as of 10/08/2021  ?Medication Sig  ? alfuzosin (UROXATRAL) 10 MG 24 hr tablet TAKE 1 TABLET BY MOUTH ONCE A DAY  ? carbidopa-levodopa (SINEMET IR) 25-100 MG tablet TAKE 1 AND 1/2 TABLETS BY MOUTH THREE TIMES DAILY  ? citalopram (CELEXA) 20 MG tablet Take 1 tablet by mouth daily.  ? docusate sodium (COLACE) 100 MG capsule Take 100 mg by mouth daily.  ? [START ON 11/05/2021] donepezil (ARICEPT) 10 MG tablet Take 1 tablet by mouth at bedtime.  ? donepezil (ARICEPT) 5 MG tablet Take 1 tablet by mouth at bedtime.  ? FIBER ADULT GUMMIES PO Take 1  tablet by mouth 2 (two) times daily.  ? irbesartan (AVAPRO) 75 MG tablet Take 1 tablet (75 mg total) by mouth every morning.  ? L-Methylfolate-B12-B6-B2 (CEREFOLIN) 12-25-48-5 MG TABS Take 1 tablet by mouth daily.  ? latanoprost (XALATAN) 0.005 % ophthalmic solution Place 1 drop into the left eye daily.  ? Multiple Vitamins-Minerals (MULTIVITAMIN ADULTS 50+ PO) Take 1 tablet by mouth daily.  ? nitroGLYCERIN (NITROSTAT) 0.4 MG SL tablet Place 1 tablet (0.4 mg total) under the tongue every 5 (five) minutes as needed for chest pain.  ? pantoprazole (PROTONIX) 40 MG tablet Take 1 tablet (40 mg total) by mouth 2 (two) times daily.  ? simvastatin (ZOCOR) 40 MG tablet TAKE 1 TABLET BY MOUTH AT BEDTIME (Patient taking differently: Take by mouth at bedtime. ON HOLD)  ? timolol (TIMOPTIC) 0.5 % ophthalmic solution Instill  1 drop into both eyes every morning  ? trihexyphenidyl (ARTANE) 2 MG tablet Take 1/2 tablet (1 mg total) by mouth 2 (two) times daily with a meal.  ? verapamil (CALAN-SR) 120 MG CR tablet Take 1 tablet (120 mg total) by mouth daily.  ? ?No facility-administered encounter medications on file as of 10/08/2021.  ? ? ?Care Gaps: ?BP- 131/87 (10/06/21) ?AWV- 2/22 ? ?Star Rating Drugs: ?Irbesartan (Avapro) 75 mg - Last filled 07/25/21 90 DS at [facility] ?Simvastatin (Zocor) 40 mg - Last filled 07/07/21 90 DS at [facility] ? ? ?Ned Clines CMA ?Clinical Pharmacist Assistant ?[phone] ? ?

## 2021-10-09 ENCOUNTER — Telehealth (INDEPENDENT_AMBULATORY_CARE_PROVIDER_SITE_OTHER): Payer: Self-pay

## 2021-10-09 ENCOUNTER — Ambulatory Visit (INDEPENDENT_AMBULATORY_CARE_PROVIDER_SITE_OTHER): Payer: Medicare Other | Admitting: Family Medicine

## 2021-10-09 ENCOUNTER — Encounter: Payer: Self-pay | Admitting: Family Medicine

## 2021-10-09 ENCOUNTER — Other Ambulatory Visit (HOSPITAL_COMMUNITY): Payer: Self-pay

## 2021-10-09 ENCOUNTER — Other Ambulatory Visit: Payer: Self-pay

## 2021-10-09 VITALS — BP 130/82 | HR 58 | Temp 98.1°F | Ht 68.0 in | Wt 158.4 lb

## 2021-10-09 DIAGNOSIS — H9111 Presbycusis, right ear: Secondary | ICD-10-CM

## 2021-10-09 DIAGNOSIS — I10 Essential (primary) hypertension: Secondary | ICD-10-CM

## 2021-10-09 DIAGNOSIS — M48062 Spinal stenosis, lumbar region with neurogenic claudication: Secondary | ICD-10-CM | POA: Diagnosis not present

## 2021-10-09 DIAGNOSIS — E785 Hyperlipidemia, unspecified: Secondary | ICD-10-CM | POA: Diagnosis not present

## 2021-10-09 DIAGNOSIS — H34812 Central retinal vein occlusion, left eye, with macular edema: Secondary | ICD-10-CM

## 2021-10-09 DIAGNOSIS — H4051X0 Glaucoma secondary to other eye disorders, right eye, stage unspecified: Secondary | ICD-10-CM

## 2021-10-09 DIAGNOSIS — H18231 Secondary corneal edema, right eye: Secondary | ICD-10-CM

## 2021-10-09 DIAGNOSIS — I700 Atherosclerosis of aorta: Secondary | ICD-10-CM | POA: Diagnosis not present

## 2021-10-09 DIAGNOSIS — Z0000 Encounter for general adult medical examination without abnormal findings: Secondary | ICD-10-CM | POA: Diagnosis not present

## 2021-10-09 DIAGNOSIS — Z79899 Other long term (current) drug therapy: Secondary | ICD-10-CM

## 2021-10-09 DIAGNOSIS — H348192 Central retinal vein occlusion, unspecified eye, stable: Secondary | ICD-10-CM

## 2021-10-09 DIAGNOSIS — Z8249 Family history of ischemic heart disease and other diseases of the circulatory system: Secondary | ICD-10-CM | POA: Diagnosis not present

## 2021-10-09 DIAGNOSIS — F32A Depression, unspecified: Secondary | ICD-10-CM

## 2021-10-09 DIAGNOSIS — H401121 Primary open-angle glaucoma, left eye, mild stage: Secondary | ICD-10-CM | POA: Diagnosis not present

## 2021-10-09 DIAGNOSIS — K219 Gastro-esophageal reflux disease without esophagitis: Secondary | ICD-10-CM

## 2021-10-09 DIAGNOSIS — G20 Parkinson's disease: Secondary | ICD-10-CM | POA: Diagnosis not present

## 2021-10-09 DIAGNOSIS — G3184 Mild cognitive impairment, so stated: Secondary | ICD-10-CM

## 2021-10-09 DIAGNOSIS — H02401 Unspecified ptosis of right eyelid: Secondary | ICD-10-CM

## 2021-10-09 MED ORDER — ROSUVASTATIN CALCIUM 10 MG PO TABS: 10.0000 mg | ORAL_TABLET | Freq: Every day | ORAL | 3 refills | Status: DC

## 2021-10-09 MED ORDER — TETANUS-DIPHTH-ACELL PERTUSSIS 5-2.5-18.5 LF-MCG/0.5 IM SUSY
PREFILLED_SYRINGE | INTRAMUSCULAR | 0 refills | Status: DC
  Filled 2021-10-09: qty 0.5, 1d supply, fill #0

## 2021-10-09 NOTE — Patient Instructions (Signed)
?  [name] , ?Thank you for taking time to come for your Medicare Wellness Visit. I appreciate your ongoing commitment to your health goals. Please review the following plan we discussed and let me know if I can assist you in the future.  ? ?These are the goals we discussed: ? Goals   ? ?  Track and Manage My Blood Pressure-Hypertension   ?  Timeframe:  Long-Range Goal ?Priority:  Medium ?Start Date:                             ?Expected End Date:                      ? ?Follow Up Date 10/22/21  ?  ?- check blood pressure weekly ?- choose a place to take my blood pressure (home, clinic or office, retail store) ?- write blood pressure results in a log or diary  ?  ?Why is this important?   ?You won't feel high blood pressure, but it can still hurt your blood vessels.  ?High blood pressure can cause heart or kidney problems. It can also cause a stroke.  ?Making lifestyle changes like losing a little weight or eating less salt will help.  ?Checking your blood pressure at home and at different times of the day can help to control blood pressure.  ?If the doctor prescribes medicine remember to take it the way the doctor ordered.  ?Call the office if you cannot afford the medicine or if there are questions about it.   ?  ?Notes:  ?  ? ?  ?  ?This is a list of the screening recommended for you and due dates:  ?Health Maintenance  ?Topic Date Due  ? Tetanus Vaccine  09/04/2021  ? Colon Cancer Screening  01/10/2031  ? Pneumonia Vaccine  Completed  ? Flu Shot  Completed  ? COVID-19 Vaccine  Completed  ? Hepatitis C Screening: USPSTF Recommendation to screen - Ages 48-79 yo.  Completed  ? Zoster (Shingles) Vaccine  Completed  ? HPV Vaccine  Aged Out  ?  ?

## 2021-10-09 NOTE — Progress Notes (Signed)
[name] is a 71 y.o. male who presents for annual wellness visit,CPE and follow-up on chronic medical conditions.  He has a history of hyperlipidemia and has tried simvastatin recently but cause difficulty with myalgias.  He was given Livallo in the past but had difficulty with the cost of that medication.  He now has x-ray evidence of aortic atherosclerosis.  Recently he had difficulty with a GI bleed and also had difficulty with anesthesia causing CNS side effects.  Presently he is seeing neurology and does have evidence of mild cognitive impairment as well as evidence of Parkinson's disease.  He is on Aricept and Sinemet.  He continues on Celexa for his underlying dysthymia.  He is taking multiple medications for his blood pressure.  Protonix is helping with his reflux symptoms.  He sees ophthalmology regularly for retinal vein occlusion is well as corneal edema ptosis.  He is followed by urology for his bladder related symptoms and seems to be doing well on Uroxatrol.  He was seen recently and Kegel exercises was recommended.  He still having some back discomfort and is going to physical therapy for this and apparently getting good results with that. ? ? ?Immunizations and Health Maintenance ?Immunization History  ?Administered Date(s) Administered  ? DTaP 09/10/1997, 08/11/2007  ? Fluad Quad(high Dose 65+) 05/17/2019, 05/01/2021  ? Influenza Split 05/09/2012, 06/09/2015, 07/11/2015  ? Influenza Whole 07/10/2004, 05/25/2006  ? Influenza, High Dose Seasonal PF 05/05/2017, 04/15/2018  ? Influenza,inj,Quad PF,6+ Mos 04/28/2013, 04/26/2014, 05/31/2016  ? Influenza-Unspecified 05/25/2016  ? Moderna SARS-COV2 Booster Vaccination 11/07/2020  ? Moderna Sars-Covid-2 Vaccination 08/20/2019, 09/17/2019, 05/22/2020  ? Pension scheme manager 85yr & up 05/01/2021  ? Pneumococcal Conjugate-13 09/07/2010  ? Pneumococcal Polysaccharide-23 07/16/2017  ? Tdap 09/05/2011  ? Zoster Recombinat (Shingrix)  01/17/2020, 03/20/2020  ? ?Health Maintenance Due  ?Topic Date Due  ? TETANUS/TDAP  09/04/2021  ? ? ?Last colonoscopy: 01/09/21 DR. KTherisa Doyne?Last PSA: 11/02/2018 ( 0.79) ?Dentist: Q year ?Ophtho: Q year ?Exercise:P/T 2x a week ? ?Other doctors caring for patient include:Dr. KTherisa DoyneGI ?          Dr. MFlonnie Overmanurology ?          MDarden Dates, NP Neurology ?           Dr. HEllyn HackCardiology ?            ? ?Advanced Directives: ?Does Patient Have a Medical Advance Directive?: Yes ?Type of Advance Directive: Healthcare Power of Attorney ?Does patient want to make changes to medical advance directive?: Yes (ED - Information included in AVS) ?Copy of [place]ake Cass[place]in Chart?: Yes - validated most recent copy scanned in chart (See row information) (still working on will) ? ?Depression screen:  See questionnaire below.   ?  ?[facility]09/2021 09/10/2020 03/20/2020 01/17/2020 12/06/2018  ?Decreased Interest 0 0 0 0 1  ?Down, Depressed, Hopeless 1 0 0 1 1  ?PHQ - 2 Score 1 0 0 1 2  ?Altered sleeping - - - - 1  ?Tired, decreased energy - - - - 1  ?Change in appetite - - - - 0  ?Feeling bad or failure about yourself  - - - - 1  ?Trouble concentrating - - - - 1  ?Moving slowly or fidgety/restless - - - - 1  ?Suicidal thoughts - - - - 0  ?PHQ-9 Score - - - - 7  ?Difficult doing work/chores - - - - Somewhat difficult  ?  Some recent data might be hidden  ? ? ?Fall Screen: See Questionaire below. ?  ?Fall Risk  10/09/2021 09/10/2020 01/17/2020 08/24/2018 02/16/2018  ?Falls in the past year? 1 0 0 0 No  ?Number falls in past yr: 0 0 - - -  ?Injury with Fall? 0 0 - - No  ?Comment - - - - -  ?Risk for fall due to : No Fall Risks No Fall Risks - - -  ?Follow up Falls evaluation completed Falls evaluation completed - - -  ? ? ?ADL screen:  See questionnaire below.  ?Functional Status Survey: ?Is the patient deaf or have difficulty hearing?: Yes (haring aide) ?Does the patient have difficulty seeing, even when wearing  glasses/contacts?: No ?Does the patient have difficulty concentrating, remembering, or making decisions?: Yes (remebering) ?Does the patient have difficulty walking or climbing stairs?: No ?Does the patient have difficulty dressing or bathing?: No ?Does the patient have difficulty doing errands alone such as visiting a doctor's office or shopping?: Yes (vision and back surgrey) ? ? ?Review of Systems ? ?Constitutional: -, -unexpected weight change, -anorexia, -fatigue ?Allergy: -sneezing, -itching, -congestion ?Dermatology: denies changing moles, rash, lumps ?ENT: -runny nose, -ear pain, -sore throat,  ?Cardiology:  -chest pain, -palpitations, -orthopnea, ?Respiratory: -cough, -shortness of breath, -dyspnea on exertion, -wheezing,  ?Gastroenterology: -abdominal pain, -nausea, -vomiting, -diarrhea, -constipation, -dysphagia ?Hematology: -bleeding or bruising problems ?Musculoskeletal: -arthralgias, -myalgias, -joint swelling, -back pain, - ?Ophthalmology: -vision changes,  ?Urology: -dysuria, -difficulty urinating,  -urinary frequency, -urgency, incontinence ?Neurology: -, -numbness, , -memory loss, -falls, -dizziness ? ? ? ?PHYSICAL EXAM: ? ?General Appearance: Alert, cooperative, no distress, appears stated age ?Head: Normocephalic, without obvious abnormality, atraumatic ?Eyes: PERRL, conjunctiva/corneas clear, EOM's intact,  ?Ears: Normal TM's and external ear canals ?Nose: Nares normal, mucosa normal, no drainage or sinus   tenderness ?Throat: Lips, mucosa, and tongue normal; teeth and gums normal ?Neck: Supple, no lymphadenopathy, thyroid:no enlargement/tenderness/nodules; no carotid bruit or JVD ?Lungs: Clear to auscultation bilaterally without wheezes, rales or ronchi; respirations unlabored ?Heart: Regular rate and rhythm, S1 and S2 normal, no murmur, rub or gallop ?Abdomen: Soft, non-tender, nondistended, normoactive bowel sounds, no masses, no hepatosplenomegaly ?Extremities: No clubbing, cyanosis or  edema ?Pulses: 2+ and symmetric all extremities ?Skin: Skin color, texture, turgor normal, no rashes or lesions ?Lymph nodes: Cervical, supraclavicular, and axillary nodes normal ?Neurologic: CNII-XII intact, normal strength, sensation and gait; reflexes 2+ and symmetric throughout hand tremors noted. ?Psych: Normal mood, affect, hygiene and grooming ? ?ASSESSMENT/PLAN: ?Routine general medical examination at a health care facility - Plan: CBC with Differential/Platelet, Comprehensive metabolic panel, Lipid panel ? ?Abdominal aortic atherosclerosis ([place]) - Plan: Lipid panel ? 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 ?Moderate essential hypertension - Plan: CBC with Differential/Platelet, Comprehensive metabolic panel ? ?Retinal vein occlusion, unspecified laterality, unspecified retinal vein ? ?Presbycusis of right ear, unspecified hearing status on contralateral side ? ?Depression, unspecified depression type ? ?Family history of heart disease in male family member before age 73 ? ?Hyperlipidemia with target LDL less than 100 - Plan: Lipid panel, rosuvastatin (CRESTOR) 10 MG tablet ? ?Medication management - Plan: CBC with Differential/Platelet, Comprehensive metabolic panel, Lipid panel, rosuvastatin (CRESTOR) 10 MG tablet ? ?Parkinsonian tremor ([place]) ? ?Spinal stenosis of lumbar region with neurogenic claudication ? ?Primary open angle glaucoma of left eye, mild stage ? ?Neovascular glaucoma, right eye, stage unspecified ? ?Ptosis of right eyelid ? ?Secondary corneal edema of right eye ? ?Mild cognitive impairment ? ?Aortic  atherosclerosis ([place]) ? ?Gastroesophageal reflux disease, unspecified whether esophagitis present ?He will continue to be followed by ophthalmology, neurology, urology.  I will switch him to Crestor.  Also discussed this with his wife.  He will let me know if he has any myalgias.  Continue with physical therapy.  Continue on present medication regimen. ? ? ?  Immunization recommendations discussed.  Colonoscopy recommendations reviewed. ? ? ?Medicare Attestation ?I have personally reviewed: ?The patient's medical and social history ?Their use of alcohol, tobacco or illicit dru

## 2021-10-09 NOTE — Telephone Encounter (Signed)
Pharmacy left vm in regard to prescription refill for the patient, [name]. I was able to contact the pharmacy again and provide the prescription refill for the patient.  ?

## 2021-10-10 ENCOUNTER — Other Ambulatory Visit (HOSPITAL_COMMUNITY): Payer: Self-pay

## 2021-10-10 ENCOUNTER — Telehealth: Payer: Self-pay | Admitting: Internal Medicine

## 2021-10-10 ENCOUNTER — Encounter: Payer: Self-pay | Admitting: Family Medicine

## 2021-10-10 DIAGNOSIS — I700 Atherosclerosis of aorta: Secondary | ICD-10-CM

## 2021-10-10 LAB — COMPREHENSIVE METABOLIC PANEL
ALT: 11 IU/L (ref 0–44)
AST: 18 IU/L (ref 0–40)
Albumin/Globulin Ratio: 1.7 (ref 1.2–2.2)
Albumin: 4.5 g/dL (ref 3.7–4.7)
Alkaline Phosphatase: 73 IU/L (ref 44–121)
BUN/Creatinine Ratio: 17 (ref 10–24)
BUN: 20 mg/dL (ref 8–27)
Bilirubin Total: 0.3 mg/dL (ref 0.0–1.2)
CO2: 23 mmol/L (ref 20–29)
Calcium: 9.1 mg/dL (ref 8.6–10.2)
Chloride: 101 mmol/L (ref 96–106)
Creatinine, Ser: 1.2 mg/dL (ref 0.76–1.27)
Globulin, Total: 2.6 g/dL (ref 1.5–4.5)
Glucose: 88 mg/dL (ref 70–99)
Potassium: 4.5 mmol/L (ref 3.5–5.2)
Sodium: 137 mmol/L (ref 134–144)
Total Protein: 7.1 g/dL (ref 6.0–8.5)
eGFR: 65 mL/min/{1.73_m2} (ref >59)

## 2021-10-10 LAB — CBC WITH DIFFERENTIAL/PLATELET
Basophils Absolute: 0.1 10*3/uL (ref 0.0–0.2)
Basos: 1 %
EOS (ABSOLUTE): 0.1 10*3/uL (ref 0.0–0.4)
Eos: 1 %
Hematocrit: 34.8 % — ABNORMAL LOW (ref 37.5–51.0)
Hemoglobin: 11.4 g/dL — ABNORMAL LOW (ref 13.0–17.7)
Immature Grans (Abs): 0 10*3/uL (ref 0.0–0.1)
Immature Granulocytes: 0 %
Lymphocytes Absolute: 1.1 10*3/uL (ref 0.7–3.1)
Lymphs: 30 %
MCH: 27.7 pg (ref 26.6–33.0)
MCHC: 32.8 g/dL (ref 31.5–35.7)
MCV: 85 fL (ref 79–97)
Monocytes Absolute: 0.6 10*3/uL (ref 0.1–0.9)
Monocytes: 17 %
Neutrophils Absolute: 1.9 10*3/uL (ref 1.4–7.0)
Neutrophils: 51 %
Platelets: 249 10*3/uL (ref 150–450)
RBC: 4.11 x10E6/uL — ABNORMAL LOW (ref 4.14–5.80)
RDW: 12 % (ref 11.6–15.4)
WBC: 3.7 10*3/uL (ref 3.4–10.8)

## 2021-10-10 LAB — LIPID PANEL
Chol/HDL Ratio: 2.8 ratio (ref 0.0–5.0)
Cholesterol, Total: 165 mg/dL (ref 100–199)
HDL: 58 mg/dL (ref >39)
LDL Chol Calc (NIH): 98 mg/dL (ref 0–99)
Triglycerides: 41 mg/dL (ref 0–149)
VLDL Cholesterol Cal: 9 mg/dL (ref 5–40)

## 2021-10-10 MED ORDER — PANTOPRAZOLE SODIUM 40 MG PO TBEC: 40.0000 mg | DELAYED_RELEASE_TABLET | Freq: Two times a day (BID) | ORAL | 0 refills | Status: DC

## 2021-10-10 MED ORDER — VERAPAMIL HCL ER 120 MG PO TBCR: 120.0000 mg | EXTENDED_RELEASE_TABLET | Freq: Every day | ORAL | 0 refills | Status: DC

## 2021-10-10 MED ORDER — ROSUVASTATIN CALCIUM 20 MG PO TABS: 20.0000 mg | ORAL_TABLET | Freq: Every day | ORAL | 0 refills | Status: DC

## 2021-10-10 MED ORDER — ROSUVASTATIN CALCIUM 20 MG PO TABS: 20.0000 mg | ORAL_TABLET | Freq: Every day | ORAL | 3 refills | Status: DC

## 2021-10-10 MED ORDER — NITROGLYCERIN 0.4 MG SL SUBL: 0.4000 mg | SUBLINGUAL_TABLET | SUBLINGUAL | 3 refills | Status: AC | PRN

## 2021-10-10 MED ORDER — CITALOPRAM HYDROBROMIDE 20 MG PO TABS: 20.0000 mg | ORAL_TABLET | Freq: Every day | ORAL | 0 refills | Status: DC

## 2021-10-10 MED ORDER — IRBESARTAN 75 MG PO TABS: 75.0000 mg | ORAL_TABLET | Freq: Every morning | ORAL | 0 refills | Status: DC

## 2021-10-10 NOTE — Telephone Encounter (Addendum)
-----   Message from Viona Gilmore, [facility] sent at 10/09/2021 10:51 AM EDT ----- ?Regarding: Refills ?Hi, ? ?[name] is going to try adherence packaging with [facility]. Can you please send refills of the following to them? ?-verapamil ?-irbestartan ?-citalopram ?-pantoprazole ?- crestor ? ?Thank you! ?Maddie ? ?

## 2021-10-10 NOTE — Telephone Encounter (Signed)
Refill sent to pharmacy.   

## 2021-10-10 NOTE — Addendum Note (Signed)
Addended by: Denita Lung on: 10/10/2021 09:46 AM ? ? Modules accepted: Orders ? ?

## 2021-10-10 NOTE — Telephone Encounter (Signed)
Refilled meds

## 2021-10-15 ENCOUNTER — Other Ambulatory Visit (HOSPITAL_COMMUNITY): Payer: Self-pay

## 2021-10-15 DIAGNOSIS — M5451 Vertebrogenic low back pain: Secondary | ICD-10-CM | POA: Diagnosis not present

## 2021-10-17 ENCOUNTER — Other Ambulatory Visit (HOSPITAL_COMMUNITY): Payer: Self-pay

## 2021-10-17 DIAGNOSIS — M5451 Vertebrogenic low back pain: Secondary | ICD-10-CM | POA: Diagnosis not present

## 2021-10-20 DIAGNOSIS — M5451 Vertebrogenic low back pain: Secondary | ICD-10-CM | POA: Diagnosis not present

## 2021-10-23 DIAGNOSIS — M5451 Vertebrogenic low back pain: Secondary | ICD-10-CM | POA: Diagnosis not present

## 2021-10-27 ENCOUNTER — Encounter (INDEPENDENT_AMBULATORY_CARE_PROVIDER_SITE_OTHER): Payer: Self-pay | Admitting: Ophthalmology

## 2021-10-27 ENCOUNTER — Ambulatory Visit (INDEPENDENT_AMBULATORY_CARE_PROVIDER_SITE_OTHER): Payer: Medicare Other | Admitting: Ophthalmology

## 2021-10-27 DIAGNOSIS — H4051X0 Glaucoma secondary to other eye disorders, right eye, stage unspecified: Secondary | ICD-10-CM | POA: Diagnosis not present

## 2021-10-27 DIAGNOSIS — H401121 Primary open-angle glaucoma, left eye, mild stage: Secondary | ICD-10-CM

## 2021-10-27 DIAGNOSIS — H34812 Central retinal vein occlusion, left eye, with macular edema: Secondary | ICD-10-CM | POA: Diagnosis not present

## 2021-10-27 NOTE — Progress Notes (Signed)
? ? ?10/27/2021 ? ?  ? 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 ? ?And history of BRVO OS with secondary CME ? ?Controlled on antivegF as also with the resolution of hep C condition with systemic therapy ? ?HISTORY OF PRESENT ILLNESS: ?[name] is a 71 y.o. male who presents to the clinic today for:  ? ?HPI   ?3 months for dilate OU, OCT, COLOR FUNDUS. ?Pt stated, "I think my vision has gotten worse. I got new glasses, the pressure was high. It seems as if I cant see as clear." ?Pt stated no new onset floaters, pt denies FOL. ?Pt stated the right eye is a little blurrier.  ?Pt had back surgery about 3 months ago. Pt is unsure. ?Pt is taking eye drops- one dark blue cap and turquoise cap. ? ? ? ?Last edited by Silvestre Moment on 10/27/2021  9:09 AM.  ?  ? ? ?Referring physician: ?Clent Jacks, MD ?[address] ?[address] ?[place],  [place] [address] ? ?HISTORICAL INFORMATION:  ? ?Selected notes from the [facility] ?  ?   ? ?CURRENT MEDICATIONS: ?Current Outpatient Medications (Ophthalmic Drugs)  ?Medication Sig  ? dorzolamide-timolol (COSOPT) 22.3-6.8 MG/ML ophthalmic solution Instill 1 drop into both eyes twice a day  ? latanoprost (XALATAN) 0.005 % ophthalmic solution Place 1 drop into the left eye daily.  ? timolol (TIMOPTIC) 0.5 % ophthalmic solution Instill 1 drop into both eyes every morning (Patient not taking: Reported on 10/09/2021)  ? ?No current facility-administered medications for this visit. (Ophthalmic Drugs)  ? ?Current Outpatient Medications (Other)  ?Medication Sig  ? alfuzosin (UROXATRAL) 10 MG 24 hr tablet TAKE 1 TABLET BY MOUTH ONCE A DAY  ? carbidopa-levodopa (SINEMET IR) 25-100 MG tablet TAKE 1 AND 1/2 TABLETS BY MOUTH THREE TIMES DAILY  ? citalopram (CELEXA) 20 MG tablet Take 1 tablet by mouth daily.  ? docusate sodium (COLACE) 100 MG capsule Take 100 mg by mouth daily.  ? [START ON 11/05/2021] donepezil (ARICEPT) 10 MG tablet Take 1  tablet by mouth at bedtime. (Patient not taking: Reported on 10/09/2021)  ? donepezil (ARICEPT) 5 MG tablet Take 1 tablet by mouth at bedtime.  ? FIBER ADULT GUMMIES PO Take 1 tablet by mouth 2 (two) times daily.  ? irbesartan (AVAPRO) 75 MG tablet Take 1 tablet (75 mg total) by mouth every morning.  ? L-Methylfolate-B12-B6-B2 (CEREFOLIN) 12-25-48-5 MG TABS Take 1 tablet by mouth daily. (Patient not taking: Reported on 10/09/2021)  ? Multiple Vitamins-Minerals (MULTIVITAMIN ADULTS 50+ PO) Take 1 tablet by mouth daily.  ? nitroGLYCERIN (NITROSTAT) 0.4 MG SL tablet Place 1 tablet (0.4 mg total) under the tongue every 5 (five) minutes as needed for chest pain.  ? pantoprazole (PROTONIX) 40 MG tablet Take 1 tablet (40 mg total) by mouth 2 (two) times daily.  ? rosuvastatin (CRESTOR) 20 MG tablet Take 1 tablet (20 mg total) by mouth daily.  ? Tdap (BOOSTRIX) 5-2.5-18.5 LF-MCG/0.5 injection Inject into the muscle.  ? trihexyphenidyl (ARTANE) 2 MG tablet Take 1/2 tablet (1 mg total) by mouth 2 (two) times daily with a meal.  ? verapamil (CALAN-SR) 120 MG CR tablet Take 1 tablet (120 mg total) by mouth daily.  ? ?No current facility-administered medications for this visit. (Other)  ? ? ? ? ?REVIEW OF SYSTEMS: ?ROS   ?Negative for: Constitutional, Gastrointestinal, Neurological, Skin, Genitourinary, Musculoskeletal, HENT, Endocrine, Cardiovascular, Eyes, Respiratory, Psychiatric, Allergic/Imm, Heme/Lymph ?  Last edited by Silvestre Moment on 10/27/2021  9:09 AM.  ?  ? ? ? ?ALLERGIES ?Allergies  ?Allergen Reactions  ? Anesthesia S-I-40 [Propofol] Other (See Comments)  ? ? ?PAST MEDICAL HISTORY ?Past Medical History:  ?Diagnosis Date  ? Allergy   ? Arthritis   ? BPH (benign prostatic hyperplasia)   ? Complication of anesthesia   ? Pt. stated he had a reaction that ended in him requiring urinary cath placement  ? Diverticulosis   ? GERD (gastroesophageal reflux disease)   ? esophageal spasms  ? Glaucoma   ? Gout   ? Head injury, closed, with  concussion   ? Hepatitis C   ? chronic - Has been treated with Harvoni  ? HLD (hyperlipidemia)   ? statin intolerant (Crestor & Simvastatin) - Taking Livalo '1mg'$  / week  ? Hypertension   ? Parkinson's disease ([place])   ? Plantar fasciitis   ? right  ? PVD (peripheral vascular disease) ([place])   ? With no claudication; only mild abdominal aortic atherosclerosis noted on ultrasound.  ? Spinal stenosis of lumbar region   ? Thoracic ascending aortic aneurysm   ? 4.2 cm ascending TAA 09/2016 CT, 1 yr f/u rec  ? Ulcer   ? ?Past Surgical History:  ?Procedure Laterality Date  ? BUNIONECTOMY WITH WEIL OSTEOTOMY Right 11/02/2019  ? Procedure: Right Foot Lapidus, Modified McBride Bunionectomy,  Hallux Akin Osteotomy;  Surgeon: Wylene Simmer, MD;  Location: [facility];  Service: Orthopedics;  Laterality: Right;  ? CARDIAC CATHETERIZATION  2005  ? 30% Cx. Dr. Melvern Banker  ? cataract surgery Left 11/05/2014  ? COLONOSCOPY    ? COLONOSCOPY N/A 01/09/2021  ? Procedure: COLONOSCOPY;  Surgeon: Ronnette Juniper, MD;  Location: [facility];  Service: Gastroenterology;  Laterality: N/A;  ? ESOPHAGOGASTRODUODENOSCOPY N/A 05/02/2015  ? Procedure: ESOPHAGOGASTRODUODENOSCOPY (EGD);  Surgeon: Ronald Lobo, MD;  Location: [facility];  Service: Endoscopy;  Laterality: N/A;  ? ESOPHAGOGASTRODUODENOSCOPY  05/02/2015  ? no source of pt chest pain endoscopically evident. small hiatal hernia.  ? ESOPHAGOGASTRODUODENOSCOPY (EGD) WITH PROPOFOL N/A 01/02/2021  ? Procedure: ESOPHAGOGASTRODUODENOSCOPY (EGD) WITH PROPOFOL;  Surgeon: Clarene Essex, MD;  Location: [facility];  Service: Endoscopy;  Laterality: N/A;  ? EYE SURGERY    ? HARDWARE REMOVAL Right 11/02/2019  ? Procedure: Second Metatarsal Removal of Deep Implant and Rotational Osteotomy;  Surgeon: Wylene Simmer, MD;  Location: [facility];  Service: Orthopedics;  Laterality: Right;  ? IR ANGIOGRAM SELECTIVE EACH ADDITIONAL VESSEL  01/04/2021  ? IR ANGIOGRAM SELECTIVE EACH  ADDITIONAL VESSEL  01/04/2021  ? IR ANGIOGRAM VISCERAL SELECTIVE  01/04/2021  ? IR US GUIDE VASC ACCESS RIGHT  01/04/2021  ? LUMBAR LAMINECTOMY/DECOMPRESSION MICRODISCECTOMY N/A 07/03/2021  ? Procedure: Lumbar three through five decompression with lumbar three through five insitu fusion;  Surgeon: Melina Schools, MD;  Location: [facility];  Service: Orthopedics;  Laterality: N/A;  ? MEMBRANE PEEL Left 03/14/2014  ? Procedure: MEMBRANE PEEL; ENDOLASER;  Surgeon: Hurman Horn, MD;  Location: [facility];  Service: Ophthalmology;  Laterality: Left;  ? NM MYOVIEW LTD  2011  ? Neg Ischemia or infarct.  ? NM MYOVIEW LTD  10/2015  ? LOW RISK. Small, fixed basal lateral defect - likely diaphragmatic attenuation. EF 69%  ? PARS PLANA VITRECTOMY Left 03/14/2014  ? Procedure: PARS PLANA VITRECTOMY WITH 25 GAUGE;  Surgeon: Hurman Horn, MD;  Location: [facility];  Service: Ophthalmology;  Laterality: Left;  ? TONSILLECTOMY    ? TRANSTHORACIC ECHOCARDIOGRAM  01/07/2021  ? Normal EF 60 to 65%.  No R WMA.  GRII DD-moderately dilated LA..  Mildly dilated RV but normal function.  Normal RAP/CVP.  Trivial AI with mild to moderate sclerosis-no stenosis  ? UPPER GASTROINTESTINAL ENDOSCOPY    ? WEIL OSTEOTOMY Right 09/01/2017  ? Procedure: RIGHT GREAT TOE CHEVRON AND WEIL OSTEOTOMY 2ND METATARSAL;  Surgeon: Newt Minion, MD;  Location: [facility];  Service: Orthopedics;  Laterality: Right;  ? ? ?FAMILY HISTORY ?Family History  ?Problem Relation Age of Onset  ? Heart disease Mother   ? Cancer Paternal Grandfather   ? Cancer Sister   ? ? ?SOCIAL HISTORY ?Social History  ? ?Tobacco Use  ? Smoking status: Former  ?  Types: Cigars  ?  Quit date: 07/27/1988  ?  Years since quitting: 33.2  ? Smokeless tobacco: Never  ?Vaping Use  ? Vaping Use: Never used  ?Substance Use Topics  ? Alcohol use: Yes  ?  Alcohol/week: 3.0 standard drinks  ?  Types: 1 Glasses of wine, 1 Cans of beer, 1 Shots of liquor per week  ?  Comment: occasional  ? Drug use: No  ? ?  ? ?   ? ?OPHTHALMIC EXAM: ? ?Base Eye Exam   ? ? Visual Acuity (ETDRS)   ? ?   Right Left  ? Dist cc CF at 3' 20/20  ? ? Correction: Glasses  ? ?  ?  ? ? Tonometry (Tonopen, 9:14 AM)   ? ?   Right Left  ? Pressure 11 13  ? ?

## 2021-10-27 NOTE — Assessment & Plan Note (Signed)
Compensated condition OS, no active disease no residual macular edema simply macular thickening residual ?

## 2021-10-27 NOTE — Assessment & Plan Note (Signed)
OS, recent elevation of intraocular pressure with evaluation by Dr. Clent Jacks, changes to medical therapy instituted. ? ?Patient to continue these new changes. ?

## 2021-10-29 DIAGNOSIS — M5451 Vertebrogenic low back pain: Secondary | ICD-10-CM | POA: Diagnosis not present

## 2021-10-31 DIAGNOSIS — M5451 Vertebrogenic low back pain: Secondary | ICD-10-CM | POA: Diagnosis not present

## 2021-11-03 ENCOUNTER — Telehealth: Payer: Self-pay | Admitting: Pharmacist

## 2021-11-03 ENCOUNTER — Other Ambulatory Visit (HOSPITAL_COMMUNITY): Payer: Self-pay

## 2021-11-03 DIAGNOSIS — M5451 Vertebrogenic low back pain: Secondary | ICD-10-CM | POA: Diagnosis not present

## 2021-11-03 NOTE — Telephone Encounter (Signed)
Patient's wife called as she wasn't sure what to do with his rosuvastatin. He was having full body aches with it and stopped taking it last Wednesday. He was unsure if it was related to PT or from the medication. The aches have resolved since stopping the medication. His wife was unsure if they should restart this or be on a different medication. Will route to PCP. ?

## 2021-11-04 MED ORDER — PITAVASTATIN CALCIUM 2 MG PO TABS: ORAL_TABLET | ORAL | 3 refills | Status: DC

## 2021-11-05 DIAGNOSIS — M5451 Vertebrogenic low back pain: Secondary | ICD-10-CM | POA: Diagnosis not present

## 2021-11-06 NOTE — Telephone Encounter (Signed)
Applied for [employer] for copay of pitavastatin and provided info to pharmacy. Called patient and made him aware of the switch to pitavastatin every other day and that he was approved for [employer] and it should be $0 copay. ?

## 2021-11-11 DIAGNOSIS — M5451 Vertebrogenic low back pain: Secondary | ICD-10-CM | POA: Diagnosis not present

## 2021-11-13 DIAGNOSIS — M5451 Vertebrogenic low back pain: Secondary | ICD-10-CM | POA: Diagnosis not present

## 2021-11-18 DIAGNOSIS — M5451 Vertebrogenic low back pain: Secondary | ICD-10-CM | POA: Diagnosis not present

## 2021-11-25 DIAGNOSIS — Z4889 Encounter for other specified surgical aftercare: Secondary | ICD-10-CM | POA: Diagnosis not present

## 2021-11-25 DIAGNOSIS — M7061 Trochanteric bursitis, right hip: Secondary | ICD-10-CM | POA: Diagnosis not present

## 2021-12-01 ENCOUNTER — Other Ambulatory Visit: Payer: Self-pay | Admitting: Medical

## 2021-12-01 ENCOUNTER — Telehealth: Payer: Self-pay | Admitting: Pharmacist

## 2021-12-01 NOTE — Chronic Care Management (AMB) (Addendum)
? ? ?Chronic Care Management ?Pharmacy Assistant  ? ?Name: [name]  MRN: [id number] DOB: 1950-09-20 ? ?Reason for Encounter: Medication Review ?  ?Conditions to be addressed/monitored: ?HTN ? ?Recent office visits:  ?10/09/21 Denita Lung, MD - Patient presented for Abdominal Aortic atherosclerosis and other concerns. Prescribed Rosuvastatin 20 mg. Stopped Simvastatin 40 mg. ? ? ?Recent consult visits:  ?10/27/21 Hurman Horn, MD - Patient presented for Neovascular glaucoma of right eye and other concerns. No medication changes. ? ? ?Hospital visits:  ?Medication Reconciliation was completed by comparing discharge summary, patient?s EMR and Pharmacy list, and upon discussion with patient. ?  ?Patient presented to [facility] on  07/03/21 due to Spinal stenosis. Patient was present for 28 hours. ?  ?New?Medications Started at [facility] Discharge:?? ?-started  ?methocarbamol (Robaxin) ?ondansetron (Zofran) ?oxyCODONE-acetaminophen (Percocet) ?  ?Medication Changes at Hospital Discharge: ?-Changed  ?None ?Medications Discontinued at Hospital Discharge: ?-Stopped ?acetaminophen 500 MG tablet (TYLENOL) ?FIBER ADULT GUMMIES PO ?HYDROcodone-acetaminophen 10-325 MG tablet (Norco) ?magnesium oxide 400 MG tablet (MAG-OX) ?meloxicam 15 MG tablet (MOBIC) ?multivitamin with minerals Tabs tablet ?oxyCODONE 5 MG immediate release tablet (Oxy IR/ROXICODONE) ?predniSONE 10 MG tablet (DELTASONE) ?VITAMIN D PO ?  ?Medications that remain the same after Hospital Discharge:??  ?-All other medications will remain the same ? ?Medications: ?Outpatient Encounter Medications as of 12/01/2021  ?Medication Sig  ? alfuzosin (UROXATRAL) 10 MG 24 hr tablet TAKE 1 TABLET BY MOUTH ONCE A DAY  ? carbidopa-levodopa (SINEMET IR) 25-100 MG tablet TAKE 1 AND 1/2 TABLETS BY MOUTH THREE TIMES DAILY  ? citalopram (CELEXA) 20 MG tablet TAKE ONE TABLET BY MOUTH EVERYDAY AT BEDTIME  ? docusate sodium (COLACE) 100 MG capsule Take 100 mg  by mouth daily.  ? donepezil (ARICEPT) 10 MG tablet Take 1 tablet by mouth at bedtime. (Patient not taking: Reported on 10/09/2021)  ? donepezil (ARICEPT) 5 MG tablet Take 1 tablet by mouth at bedtime.  ? dorzolamide-timolol (COSOPT) 22.3-6.8 MG/ML ophthalmic solution Instill 1 drop into both eyes twice a day  ? FIBER ADULT GUMMIES PO Take 1 tablet by mouth 2 (two) times daily.  ? irbesartan (AVAPRO) 75 MG tablet Take 1 tablet (75 mg total) by mouth every morning.  ? L-Methylfolate-B12-B6-B2 (CEREFOLIN) 12-25-48-5 MG TABS Take 1 tablet by mouth daily. (Patient not taking: Reported on 10/09/2021)  ? latanoprost (XALATAN) 0.005 % ophthalmic solution Place 1 drop into the left eye daily.  ? Multiple Vitamins-Minerals (MULTIVITAMIN ADULTS 50+ PO) Take 1 tablet by mouth daily.  ? nitroGLYCERIN (NITROSTAT) 0.4 MG SL tablet Place 1 tablet (0.4 mg total) under the tongue every 5 (five) minutes as needed for chest pain.  ? pantoprazole (PROTONIX) 40 MG tablet Take 1 tablet (40 mg total) by mouth 2 (two) times daily.  ? Pitavastatin Calcium 2 MG TABS Take every other day  ? Tdap (BOOSTRIX) 5-2.5-18.5 LF-MCG/0.5 injection Inject into the muscle.  ? timolol (TIMOPTIC) 0.5 % ophthalmic solution Instill 1 drop into both eyes every morning (Patient not taking: Reported on 10/09/2021)  ? trihexyphenidyl (ARTANE) 2 MG tablet Take 1/2 tablet (1 mg total) by mouth 2 (two) times daily with a meal.  ? verapamil (CALAN-SR) 120 MG CR tablet Take 1 tablet (120 mg total) by mouth daily.  ? ?No facility-administered encounter medications on file as of 12/01/2021.  ? ?Reviewed chart prior to disease state call. Spoke with patient regarding BP ? ?Recent Office Vitals: ?BP Readings from Last 3 Encounters:  ?10/09/21 130/82  ?  10/06/21 131/87  ?08/05/21 110/70  ? ?Pulse Readings from Last 3 Encounters:  ?10/09/21 (!) 58  ?10/06/21 63  ?08/05/21 71  ?  ?Wt Readings from Last 3 Encounters:  ?10/09/21 158 lb 6.4 oz (71.8 kg)  ?10/06/21 160 lb (72.6 kg)   ?08/05/21 158 lb 6.4 oz (71.8 kg)  ?  ? ?Kidney Function ?Lab Results  ?Component Value Date/Time  ? CREATININE 1.20 10/09/2021 01:28 PM  ? CREATININE 0.98 07/08/2021 11:23 AM  ? CREATININE 1.06 06/29/2014 09:20 AM  ? CREATININE 1.14 09/15/2012 09:07 AM  ? GFRNONAA >60 06/30/2021 02:30 PM  ? GFRAA 68 09/10/2020 10:58 AM  ? ? ? ?  Latest Ref Rng & Units 10/09/2021  ?  1:28 PM 07/08/2021  ? 11:23 AM 06/30/2021  ?  2:30 PM  ?BMP  ?Glucose 70 - 99 mg/dL 88   133   92    ?BUN 8 - 27 mg/dL '20   31   18    '$ ?Creatinine 0.76 - 1.27 mg/dL 1.20   0.98   1.08    ?BUN/Creat Ratio 10 - 24 17   32     ?Sodium 134 - 144 mmol/L 137   139   134    ?Potassium 3.5 - 5.2 mmol/L 4.5   4.5   4.3    ?Chloride 96 - 106 mmol/L 101   102   104    ?CO2 20 - 29 mmol/L '23   29   28    '$ ?Calcium 8.6 - 10.2 mg/dL 9.1   9.6   9.3    ? ? ?Current antihypertensive regimen:  ?Irbesartan 75 mg 1 tablet every morning ?Verapamil 120 mg 1 tablet daily - in AM ?How often are you checking your Blood Pressure? weekly ?Current home BP readings:  ?BP Readings from Last 3 Encounters:  ?10/09/21 130/82  ?10/06/21 131/87  ?08/05/21 110/70  ? ? ? ?Reviewed chart for medication changes ahead of medication coordination call. ? ?No OVs, Consults, or hospital visits since last care coordination call/Pharmacist visit.  ? ?No medication changes indicated  ? ?BP Readings from Last 3 Encounters:  ?10/09/21 130/82  ?10/06/21 131/87  ?08/05/21 110/70  ?  ?No results found for: HGBA1C  ? ?Patient obtains medications through Adherence Packaging  30 Days  ? ?Patient is due for 1st adherence delivery on: 12/11/21. ?Called patient and reviewed medications and coordinated delivery. ?Packs 30 DS ? ?This delivery to include: ?Alfuzosin (Uroxatral) 10 mg : Take one tab at bedtime ?Verapamil (Calan-SR) 120 mg: Take one tab at breakfast ?Citalopram (Celexa) 20 mg: Take one tab at bedtime ?Trihexyphenidyl (Artane) 2 mg: Take half tab at breakfast and half at dinner ?Carbidopa - Levodopa  (Sinemet) 25-100 mg: Take one and a half tabs before breakfast, lunch and dinner ?Pantoprazole (Protonix) 40 mg: Take one tab at breakfast and dinner ?Irbesartan (Avapro) 75 mg: Take one tab at breakfast ?Pitavastatin Calcium (Livalo) 2 mg: Take one tab every other day at breakfast ?Donepezil (Aricept) 10 mg: Take one tab at bedtime ? ?[name] Drops Due 01/17/22 ?Dorzolamide - timolol Due 01/01/22 ? ?Wife requested Celexa be added to packaging/ script sent over to pharm on 12/01/21 to be added to bedtime pack. ?Wife also made aware of change of Pitavastatin every other day, she reports he had still been doing twice a week, she reports he will begin doing every other day as of his next dose. ? ?Wife Declined due to abundance on hand: ?Nitroglycerin (Nitrostat) 0.4 mg:  Take one tab every 5 min PRN for chest pain ( 2 full vials) ? ?Confirmed delivery date of 12/11/21, advised patient that pharmacy will contact them the morning of delivery.  ? ? ?Care Gaps: ?BP- 130/82 ( 10/09/21) ?AWV- 2/22 ?CCM - Need July/Aug decl at this time ? ?Star Rating Drugs: ?Irbesartan (Avapro) 75 mg - Last filled 11/03/21 38  DS at [facility] ?Pitavastatin 2 mg - Last filled 11/06/21 38 DS at [facility] ? ?Patient Assistance: ?[name] for 2023 Approved ? ?Ned Clines CMA ?Clinical Pharmacist Assistant ?[phone] ? ?

## 2021-12-02 ENCOUNTER — Telehealth: Payer: Self-pay

## 2021-12-02 ENCOUNTER — Encounter: Payer: Self-pay | Admitting: Family Medicine

## 2021-12-02 NOTE — Telephone Encounter (Signed)
Pt wife was advised and would like to know if she should take it as well. Please advise [place] ?

## 2021-12-02 NOTE — Telephone Encounter (Signed)
Pt wife would like to know if he should resume his asa 88 vitamin d and calcium. Pt has not been taking since surgery and she was unclear if she should ask you or cardiologist . Please advise [facility] ?

## 2021-12-04 NOTE — Telephone Encounter (Signed)
Pt wife was advised [name] ?

## 2021-12-05 ENCOUNTER — Encounter: Payer: Self-pay | Admitting: Family Medicine

## 2021-12-05 ENCOUNTER — Ambulatory Visit (INDEPENDENT_AMBULATORY_CARE_PROVIDER_SITE_OTHER): Payer: Medicare Other | Admitting: Family Medicine

## 2021-12-05 VITALS — BP 120/80 | HR 63 | Temp 97.7°F | Wt 153.6 lb

## 2021-12-05 DIAGNOSIS — H9313 Tinnitus, bilateral: Secondary | ICD-10-CM | POA: Diagnosis not present

## 2021-12-05 NOTE — Progress Notes (Signed)
? ?  Subjective:  ? ? Patient ID: [name], male    DOB: [birth year], 71 y.o.   MRN: [id number] ? ?HPI ?He complains of a 6-week history of ringing in his ears.  He also states that he is had some balance issues but states that this has been going on for a longer period of time.  He does not complain of any difficulty with hearing.  No fever, chills, earache or sore throat. ? ? ?Review of Systems ? ?   ?Objective:  ? Physical Exam ?Alert and in no distress.  Both TMs were visible although cerumen was present.  He could hear a spoken whisper without difficulty. ? ? ? ?   ?Assessment & Plan:  ?Tinnitus of both ears ?I explained that there is no real cure for this and the best would be to use white noise.  I did not see any indication of any other major issues that can cause ringing in his ears.  He was comfortable with that. ? ?

## 2021-12-10 ENCOUNTER — Ambulatory Visit: Payer: Medicare Other | Admitting: Cardiology

## 2021-12-10 ENCOUNTER — Encounter: Payer: Self-pay | Admitting: Cardiology

## 2021-12-10 VITALS — BP 124/78 | HR 62 | Ht 70.0 in | Wt 154.2 lb

## 2021-12-10 DIAGNOSIS — E785 Hyperlipidemia, unspecified: Secondary | ICD-10-CM | POA: Diagnosis not present

## 2021-12-10 DIAGNOSIS — I700 Atherosclerosis of aorta: Secondary | ICD-10-CM | POA: Diagnosis not present

## 2021-12-10 DIAGNOSIS — Z961 Presence of intraocular lens: Secondary | ICD-10-CM | POA: Diagnosis not present

## 2021-12-10 DIAGNOSIS — H401132 Primary open-angle glaucoma, bilateral, moderate stage: Secondary | ICD-10-CM | POA: Diagnosis not present

## 2021-12-10 DIAGNOSIS — I10 Essential (primary) hypertension: Secondary | ICD-10-CM

## 2021-12-10 DIAGNOSIS — Z8249 Family history of ischemic heart disease and other diseases of the circulatory system: Secondary | ICD-10-CM | POA: Diagnosis not present

## 2021-12-10 DIAGNOSIS — H34832 Tributary (branch) retinal vein occlusion, left eye, with macular edema: Secondary | ICD-10-CM | POA: Diagnosis not present

## 2021-12-10 DIAGNOSIS — H34812 Central retinal vein occlusion, left eye, with macular edema: Secondary | ICD-10-CM | POA: Diagnosis not present

## 2021-12-10 NOTE — Progress Notes (Signed)
Primary Care Provider: Denita Lung, MD Cardiologist: Glenetta Hew, MD Electrophysiologist: None  Rankin, Clent Demark, MD (Ophthalmology)   Frann Rider, NP (Neurology)  [name], MD (General Surg)  Wylene Simmer, MD Melina Schools, MD (Orthopedic Surg) -with Dr. Nelva Bush as [facility]  Clinic Note: Chief Complaint  Patient presents with   Follow-up    52-monthpostop    ===================================  ASSESSMENT/PLAN   Problem List Items Addressed This Visit       Cardiology Problems   Hyperlipidemia with target LDL less than 100 (Chronic)    We have previously documented his intolerance of both simvastatin and rosuvastatin.  However they were both restarted he is following now on every other day atorvastatin/Livalo.  Closely managed by his PCP and Pharm.D.  Last lipids actually show LDL of 98 which is not too far off goal.  Would be reluctant to push too hard with his memory issues.       Abdominal aortic atherosclerosis (HCC) (Chronic)    Continue blood pressure, lipid and glycemic control.   Plan will be to reassess AAA Dopplers at next follow-up.       Moderate essential hypertension - Primary (Chronic)    Blood pressure was pretty well controlled today.  He is on unusual regimen with verapamil and irbesartan but pretty well can trolled.  With him having some dizziness, and unsteady gait, I am leery of pushing any further.  With preferably allow for mild permissive hypertension (okay for systolic pressures up to 1277mmHg).         Other   Family history of heart disease in male family member before age 72(Chronic)    The absence of any active symptoms, we have chosen to avoid ischemic evaluation.  We talked about potentially doing Coronary Calcium Score however we are still can be trying to treat his lipids.  Trying to shoot for LDL less than 70 if possible but less than 100 is a supple for now.  Once he is stable with no postop issues etc., we can consider  reevaluation with Coronary Calcium Score for screening.       MRonalee Beltsappears to be stable from a cardiac standpoint.  His blood pressure and lipids are being followed closely by PCP.  As such I will only see him on an annual basis to reassess symptoms.  In the absence of any ischemic symptoms or arrhythmias, we will hold off on further testing.  ===================================  HPI:    MTERI DILTZis a 71y.o. male with a PMH notable for Moderate Essential Hypertension, HLD, and Abdominal Aortic Atherosclerosis with a Family History of CAD who presents today for 840-monthollow-up.  MiAZARIAS CHIOUas last seen in September 2022 for 4-32-monthspital follow-up -> when I saw him in May 2022, he was having some atypical sounding chest discomfort and the plan was to evaluate with Coronary CTA.  We also added Imdur.  Unfortunately, this test was never done.  He had been seen for GI bleed and underwent EGD and colonoscop in June.  He had not had any further episodes of chest pain or pressure with rest or exertion since his EGD.  No longer have any palpitations or irregular heartbeats.  He did not have any chest pain or pressure or dyspnea with hypotension and frank anemia in the hospital. => At this follow-up visit he was doing pretty well for cardiac standpoint.  Just a little bit tired and worn out since the hospitalization, not  having gotten back into the full exercise level.  He was hoping to get back into doing his bicycle riding at the [facility].  He just noted some exertional dyspnea and fatigue from deconditioning, as well as off-and-on skipped beats but nothing prolonged. => With no further chest pain symptoms we decided to forego ischemic evaluation with Myoview Coronary CTA-especially in light of the fact that he had no symptoms with significant anemia.  We talked about potentially checking Coronary Calcium Score.  No medication changes. => He was cleared for back surgery  Recent  Hospitalizations:  07/03/2021: L3-5 decompression with in situ fusion (Dr. Duane Lope Brooks)-went well.  Unfortunately, following surgery he was very disoriented.  He was given both Haldol and Seroquel for agitation.  He noted episodes of hallucination and extreme disorientation.  Unfortunately he was also being treated with narcotics and muscle relaxants as well as gabapentin.  It also appeared that this is the second occasion this is occurred with anesthesia  Pretty slow recovery.  Still has some cloudy sensorium by late December.  (4 weeks postop).  Noted that he falls asleep quickly but wakes up after 3 hours to void.  Not yet getting and exercise at that time. Also, in the interim he had issues with myalgias from simvastatin as well as rosuvastatin.  Was subsequently switched to Livalo, unfortunately, there were issues with cost..  Follow-up visits with see PCP indicate difficulty with managing lipids, but also issues with Parkinson's and dementia.  On Aricept and Sinemet.  Also on Celexa for dysthymia. => As of March 16, he put him back on Crestor 10 that led to full body aches.  They are able to finally get coverage with the [facility] for 0 co-pay for Livalo.  He was subsequently written a prescription for taking it every other day.  Reviewed  CV studies:    The following studies were reviewed today: (if available, images/films reviewed: From Epic Chart or Care Everywhere) No new studies:  Interval History:   TREA LATNER returns here today for follow-up alone which is somewhat difficult because it is relatively poor historian.  He says that he is gradually getting back into exercise and we had a prolonged recovery from his back surgery.  He had all the issues noted above that has delayed his exercise.  He is able to do stretching exercises and light lifting, but had not been out of do much walking until recently.  Now able to do about a half a mile a day.  The back does bother him.  He  says he occasionally will get some shortness of breath with exertion and lightheadedness but he is somewhat deconditioned, he denies any chest pain or pressure.  Rare palpitations but nothing prolonged.  He also off and on has numbness and tingling in his hands.  This has been since his back surgery.  (Does not necessarily make sense because that was the L-spine surgery).  He seems to be tolerating Livalo at this point without significant myalgias.  He is taking it every other day.  He is on stable blood pressure regimen and pressures seem to be pretty stable.  No syncope or near syncope.  No PND, orthopnea or edema.  CV Review of Symptoms (Summary) Cardiovascular ROS: positive for - dyspnea on exertion and off and on skipped beats but nothing prolonged negative for - chest pain, edema, orthopnea, paroxysmal nocturnal dyspnea, rapid heart rate, shortness of breath, or lightheadedness, dizziness or wooziness, syncope/near syncope or TIA/amaurosis  fugax, claudication  REVIEWED OF SYSTEMS   Review of Systems  Constitutional:  Positive for malaise/fatigue (Still fatigued and worn out.  Slow recovery from her surgery.  Most likely it was after his GI bleed episode last summer.  Energy still not back to baseline.). Negative for weight loss.  HENT:  Negative for congestion and nosebleeds.   Respiratory:  Negative for cough, shortness of breath and wheezing.   Cardiovascular:        Per HPI  Gastrointestinal:  Negative for blood in stool, constipation and melena.  Genitourinary:  Positive for frequency (About 3 times a night). Negative for dysuria and hematuria.  Musculoskeletal:  Positive for back pain and myalgias (Notably improved since finally getting on a stable statin dose.). Negative for falls (Has had near falls, but no true falls.).  Neurological:  Positive for dizziness and weakness (Generalized-especially legs). Negative for headaches.  Endo/Heme/Allergies:  Does not bruise/bleed easily.   Psychiatric/Behavioral:  Positive for memory loss (Dementia).    I have reviewed and (if needed) personally updated the patient's problem list, medications, allergies, past medical and surgical history, social and family history.   PAST MEDICAL HISTORY   Past Medical History:  Diagnosis Date   Allergy    Arthritis    BPH (benign prostatic hyperplasia)    Complication of anesthesia    Pt. stated he had a reaction that ended in him requiring urinary cath placement   Diverticulosis    GERD (gastroesophageal reflux disease)    esophageal spasms   Glaucoma    Gout    Head injury, closed, with concussion    Hepatitis C    chronic - Has been treated with Harvoni   HLD (hyperlipidemia)    statin intolerant (Crestor & Simvastatin) - Taking Livalo '1mg'$  / week   Hypertension    Parkinson's disease ([place])    Plantar fasciitis    right   PVD (peripheral vascular disease) ([place])    With no claudication; only mild abdominal aortic atherosclerosis noted on ultrasound.   Spinal stenosis of lumbar region    Thoracic ascending aortic aneurysm (HCC)    4.2 cm ascending TAA 09/2016 CT, 1 yr f/u rec   Ulcer     PAST SURGICAL HISTORY   Past Surgical History:  Procedure Laterality Date   BUNIONECTOMY WITH WEIL OSTEOTOMY Right 11/02/2019   Procedure: Right Foot Lapidus, Modified McBride Bunionectomy,  Hallux Akin Osteotomy;  Surgeon: Wylene Simmer, MD;  Location: [place];  Service: Orthopedics;  Laterality: Right;   CARDIAC CATHETERIZATION  2005   30% Cx. Dr. Melvern Banker   cataract surgery Left 11/05/2014   COLONOSCOPY     COLONOSCOPY N/A 01/09/2021   Procedure: COLONOSCOPY;  Surgeon: Ronnette Juniper, MD;  Location: [facility];  Service: Gastroenterology;  Laterality: N/A;   ESOPHAGOGASTRODUODENOSCOPY N/A 05/02/2015   Procedure: ESOPHAGOGASTRODUODENOSCOPY (EGD);  Surgeon: Ronald Lobo, MD;  Location: [facility];  Service: Endoscopy;  Laterality: N/A;   ESOPHAGOGASTRODUODENOSCOPY   05/02/2015   no source of pt chest pain endoscopically evident. small hiatal hernia.   ESOPHAGOGASTRODUODENOSCOPY (EGD) WITH PROPOFOL N/A 01/02/2021   Procedure: ESOPHAGOGASTRODUODENOSCOPY (EGD) WITH PROPOFOL;  Surgeon: Clarene Essex, MD;  Location: [facility];  Service: Endoscopy;  Laterality: N/A;   EYE SURGERY     HARDWARE REMOVAL Right 11/02/2019   Procedure: Second Metatarsal Removal of Deep Implant and Rotational Osteotomy;  Surgeon: Wylene Simmer, MD;  Location: [place];  Service: Orthopedics;  Laterality: Right;   IR [place]  ADDITIONAL VESSEL  01/04/2021   IR ANGIOGRAM SELECTIVE EACH ADDITIONAL VESSEL  01/04/2021   IR ANGIOGRAM VISCERAL SELECTIVE  01/04/2021   IR US GUIDE VASC ACCESS RIGHT  01/04/2021   LUMBAR LAMINECTOMY/DECOMPRESSION MICRODISCECTOMY N/A 07/03/2021   Procedure: Lumbar three through five decompression with lumbar three through five insitu fusion;  Surgeon: Melina Schools, MD;  Location: [place];  Service: Orthopedics;  Laterality: N/A;   MEMBRANE PEEL Left 03/14/2014   Procedure: MEMBRANE PEEL; ENDOLASER;  Surgeon: Hurman Horn, MD;  Location: [place];  Service: Ophthalmology;  Laterality: Left;   NM MYOVIEW LTD  10/2015   LOW RISK. Small, fixed basal lateral defect - likely diaphragmatic attenuation. EF 69%   PARS PLANA VITRECTOMY Left 03/14/2014   Procedure: PARS PLANA VITRECTOMY WITH 25 GAUGE;  Surgeon: Hurman Horn, MD;  Location: [place];  Service: Ophthalmology;  Laterality: Left;   TONSILLECTOMY     TRANSTHORACIC ECHOCARDIOGRAM  01/07/2021   Normal EF 60 to 65%.  No R WMA.  GRII DD-moderately dilated LA..  Mildly dilated RV but normal function.  Normal RAP/CVP.  Trivial AI with mild to moderate sclerosis-no stenosis   UPPER GASTROINTESTINAL ENDOSCOPY     WEIL OSTEOTOMY Right 09/01/2017   Procedure: RIGHT GREAT TOE CHEVRON AND WEIL OSTEOTOMY 2ND METATARSAL;  Surgeon: Newt Minion, MD;  Location: [place];  Service: Orthopedics;   Laterality: Right;    Immunization History  Administered Date(s) Administered   DTaP 09/10/1997, 08/11/2007   Fluad Quad(high Dose 65+) 05/17/2019, 05/01/2021   Influenza Split 05/09/2012, 06/09/2015, 07/11/2015   Influenza Whole 07/10/2004, 05/25/2006   Influenza, High Dose Seasonal PF 05/05/2017, 04/15/2018   Influenza,inj,Quad PF,6+ Mos 04/28/2013, 04/26/2014, 05/31/2016   Influenza-Unspecified 05/25/2016   Moderna SARS-COV2 Booster Vaccination 11/07/2020   Moderna Sars-Covid-2 Vaccination 08/20/2019, 09/17/2019, 05/22/2020   Pfizer Covid-19 Vaccine Bivalent Booster 39yr & up 05/01/2021   Pneumococcal Conjugate-13 09/07/2010   Pneumococcal Polysaccharide-23 07/16/2017   Tdap 09/05/2011, 10/09/2021   Zoster Recombinat (Shingrix) 01/17/2020, 03/20/2020    MEDICATIONS/ALLERGIES   Current Meds  Medication Sig   carbidopa-levodopa (SINEMET IR) 25-100 MG tablet TAKE 1 AND 1/2 TABLETS BY MOUTH THREE TIMES DAILY   citalopram (CELEXA) 20 MG tablet TAKE ONE TABLET BY MOUTH EVERYDAY AT BEDTIME   donepezil (ARICEPT) 10 MG tablet Take 1 tablet by mouth at bedtime.   dorzolamide-timolol (COSOPT) 22.3-6.8 MG/ML ophthalmic solution Instill 1 drop into both eyes twice a day   FIBER ADULT GUMMIES PO Take 1 tablet by mouth 2 (two) times daily.   irbesartan (AVAPRO) 75 MG tablet Take 1 tablet (75 mg total) by mouth every morning.   L-Methylfolate-B12-B6-B2 (CEREFOLIN) 12-25-48-5 MG TABS Take 1 tablet by mouth daily.   latanoprost (XALATAN) 0.005 % ophthalmic solution Place 1 drop into the left eye daily.   Multiple Vitamins-Minerals (MULTIVITAMIN ADULTS 50+ PO) Take 1 tablet by mouth daily.   nitroGLYCERIN (NITROSTAT) 0.4 MG SL tablet Place 1 tablet (0.4 mg total) under the tongue every 5 (five) minutes as needed for chest pain.   pantoprazole (PROTONIX) 40 MG tablet Take 1 tablet (40 mg total) by mouth 2 (two) times daily.   Pitavastatin Calcium 2 MG TABS Take every other day   timolol (TIMOPTIC)  0.5 % ophthalmic solution Instill 1 drop into both eyes every morning   trihexyphenidyl (ARTANE) 2 MG tablet Take 1/2 tablet (1 mg total) by mouth 2 (two) times daily with a meal.   verapamil (CALAN-SR) 120 MG CR tablet Take 1 tablet (120  mg total) by mouth daily.    Allergies  Allergen Reactions   Anesthesia S-I-40 [Propofol] Other (See Comments)    SOCIAL HISTORY/FAMILY HISTORY   Reviewed in Epic:  Pertinent findings:  Social History   Tobacco Use   Smoking status: Former    Types: Cigars    Quit date: 07/27/1988    Years since quitting: 33.4   Smokeless tobacco: Never  Vaping Use   Vaping Use: Never used  Substance Use Topics   Alcohol use: Yes    Alcohol/week: 3.0 standard drinks    Types: 1 Glasses of wine, 1 Cans of beer, 1 Shots of liquor per week    Comment: occasional   Drug use: No   Social History   Social History Narrative   Lives with wife    Right handed   Drinks 1-2 cups caffeine daily    OBJCTIVE -PE, EKG, labs   Wt Readings from Last 3 Encounters:  12/10/21 154 lb 3.2 oz (69.9 kg)  12/05/21 153 lb 9.6 oz (69.7 kg)  10/09/21 158 lb 6.4 oz (71.8 kg)    Physical Exam: BP 124/78   Pulse 62   Ht '5\' 10"'$  (1.778 m)   Wt 154 lb 3.2 oz (69.9 kg)   SpO2 95%   BMI 22.13 kg/m  Physical Exam Vitals reviewed.  Constitutional:      General: He is not in acute distress.    Appearance: Normal appearance. He is normal weight. He is not ill-appearing.  HENT:     Head: Normocephalic and atraumatic.  Neck:     Vascular: No carotid bruit.  Cardiovascular:     Rate and Rhythm: Normal rate.     Pulses: Normal pulses.     Heart sounds: Murmur (Soft 1/6 SEM at RUSB.) heard.    No friction rub. No gallop.  Pulmonary:     Effort: Pulmonary effort is normal. No respiratory distress.     Breath sounds: Normal breath sounds. No wheezing, rhonchi or rales.  Chest:     Chest wall: No tenderness.  Musculoskeletal:     Cervical back: Normal range of motion and  neck supple.  Neurological:     Mental Status: He is alert and oriented to person, place, and time.     Gait: Gait abnormal (Shuffling gait).     Comments: Seems a little confused.  Slow/poor historian.  Poor recall. Pill-rolling tremor left greater than right hand  Psychiatric:     Comments: Stable, somewhat subdued mood.  Poor historian.  Question recall ability for insight.     Adult ECG Report Not checked  Recent Labs:  Reviewed  Lab Results  Component Value Date   CHOL 165 10/09/2021   HDL 58 10/09/2021   LDLCALC 98 10/09/2021   TRIG 41 10/09/2021   CHOLHDL 2.8 10/09/2021   Lab Results  Component Value Date   CREATININE 1.20 10/09/2021   BUN 20 10/09/2021   NA 137 10/09/2021   K 4.5 10/09/2021   CL 101 10/09/2021   CO2 23 10/09/2021      Latest Ref Rng & Units 10/09/2021    1:28 PM 07/08/2021   11:23 AM 06/30/2021    2:30 PM  CBC  WBC 3.4 - 10.8 x10E3/uL 3.7   9.4   4.1    Hemoglobin 13.0 - 17.7 g/dL 11.4   10.5   12.4    Hematocrit 37.5 - 51.0 % 34.8   31.0   39.0    Platelets 150 -  450 x10E3/uL 249   244   256      No results found for: HGBA1C Lab Results  Component Value Date   TSH 3.131 01/11/2021    ==================================================  COVID-19 Education: The signs and symptoms of COVID-19 were discussed with the patient and how to seek care for testing (follow up with PCP or arrange E-visit).    I spent a total of 14 minutes with the patient spent in direct patient consultation.  Additional time spent with chart review  / charting (studies, outside notes, etc): 30 min => Extensive chart review of clinic visits between his most recent visit with me.  Reviewed issues with statins etc.  Also with his postop concerns. Total Time: 44 min  Current medicines are reviewed at length with the patient today.  (+/- concerns) n/a  This visit occurred during the SARS-CoV-2 public health emergency.  Safety protocols were in place, including  screening questions prior to the visit, additional usage of staff PPE, and extensive cleaning of exam room while observing appropriate contact time as indicated for disinfecting solutions.  Notice: This dictation was prepared with Dragon dictation along with smart phrase technology. Any transcriptional errors that result from this process are unintentional and may not be corrected upon review.  Studies Ordered:   No orders of the defined types were placed in this encounter.  No orders of the defined types were placed in this encounter.   Patient Instructions / Medication Changes & Studies & Tests Ordered   Patient Instructions  Medication Instructions:  No changes  *If you need a refill on your cardiac medications before your next appointment, please call your pharmacy*   Lab Work: Not needed If you have labs (blood work) drawn today and your tests are completely normal, you will receive your results only by: MyChart Message (if you have MyChart) OR A paper copy in the mail If you have any lab test that is abnormal or we need to change your treatment, we will call you to review the results.   Testing/Procedures:  Not needed  Follow-Up: At [facility], you and your health needs are our priority.  As part of our continuing mission to provide you with exceptional heart care, we have created designated Provider Care Teams.  These Care Teams include your primary Cardiologist (physician) and Advanced Practice Providers (APPs -  Physician Assistants and Nurse Practitioners) who all work together to provide you with the care you need, when you need it.     Your next appointment:   12 month(s)  The format for your next appointment:   In Person  Provider:   Glenetta Hew, MD      Glenetta Hew, M.D., M.S. Interventional Cardiologist   Pager # [phone] Phone # [phone] [address] [place], [place] [address]   Thank you for choosing Heartcare at  [facility]!!

## 2021-12-10 NOTE — Patient Instructions (Addendum)
Medication Instructions:  No changes  *If you need a refill on your cardiac medications before your next appointment, please call your pharmacy*   Lab Work: Not needed If you have labs (blood work) drawn today and your tests are completely normal, you will receive your results only by: . MyChart Message (if you have MyChart) OR . A paper copy in the mail If you have any lab test that is abnormal or we need to change your treatment, we will call you to review the results.   Testing/Procedures:  Not needed  Follow-Up: At [facility], you and your health needs are our priority.  As part of our continuing mission to provide you with exceptional heart care, we have created designated Provider Care Teams.  These Care Teams include your primary Cardiologist (physician) and Advanced Practice Providers (APPs -  Physician Assistants and Nurse Practitioners) who all work together to provide you with the care you need, when you need it.   Your next appointment:   12 month(s)  The format for your next appointment:   In Person  Provider:   David Harding, MD  

## 2021-12-12 ENCOUNTER — Other Ambulatory Visit: Payer: Self-pay | Admitting: Neurology

## 2021-12-23 ENCOUNTER — Encounter: Payer: Self-pay | Admitting: Cardiology

## 2021-12-23 NOTE — Assessment & Plan Note (Signed)
Continue blood pressure, lipid and glycemic control.   Plan will be to reassess AAA Dopplers at next follow-up.

## 2021-12-23 NOTE — Assessment & Plan Note (Signed)
The absence of any active symptoms, we have chosen to avoid ischemic evaluation.  We talked about potentially doing Coronary Calcium Score however we are still can be trying to treat his lipids.  Trying to shoot for LDL less than 70 if possible but less than 100 is a supple for now.  Once he is stable with no postop issues etc., we can consider reevaluation with Coronary Calcium Score for screening.

## 2021-12-23 NOTE — Assessment & Plan Note (Signed)
Blood pressure was pretty well controlled today.  He is on unusual regimen with verapamil and irbesartan but pretty well can trolled.  With him having some dizziness, and unsteady gait, I am leery of pushing any further.  With preferably allow for mild permissive hypertension (okay for systolic pressures up to 073 mmHg).

## 2021-12-23 NOTE — Assessment & Plan Note (Signed)
We have previously documented his intolerance of both simvastatin and rosuvastatin.  However they were both restarted he is following now on every other day atorvastatin/Livalo.  Closely managed by his PCP and Pharm.D.  Last lipids actually show LDL of 98 which is not too far off goal.  Would be reluctant to push too hard with his memory issues.

## 2021-12-24 ENCOUNTER — Telehealth: Payer: Self-pay | Admitting: Adult Health

## 2021-12-24 NOTE — Telephone Encounter (Signed)
Pt's wife, Zack Seal requiest refill for L-Methylfolate-B12-B6-B2 (CEREFOLIN) 12-25-48-5 MG TABS at [facility]

## 2021-12-30 ENCOUNTER — Other Ambulatory Visit: Payer: Self-pay | Admitting: Family Medicine

## 2021-12-30 ENCOUNTER — Other Ambulatory Visit: Payer: Self-pay | Admitting: Medical

## 2021-12-30 ENCOUNTER — Telehealth: Payer: Self-pay | Admitting: Pharmacist

## 2021-12-30 DIAGNOSIS — H9111 Presbycusis, right ear: Secondary | ICD-10-CM

## 2021-12-30 DIAGNOSIS — G20 Parkinson's disease: Secondary | ICD-10-CM

## 2021-12-30 NOTE — Chronic Care Management (AMB) (Signed)
Chronic Care Management Pharmacy Assistant   Name: [name]  MRN: [id number] DOB: 23-Mar-1951  Reason for Encounter: Medication Review Medication Coordination     Recent office visits:  12/05/21 Denita Lung, MD- Patient presented for Tinnitus of both ears. Increased Donepezil to 10 mg.  Recent consult visits:  12/10/21 [name], MD (Cardiology) - Patient presented for Moderate essential hypertension and other concerns. Stopped Docusate Sodium.  Hospital visits:  Medication Reconciliation was completed by comparing discharge summary, patient's EMR and Pharmacy list, and upon discussion with patient.   Patient presented to [facility] on  07/03/21 due to Spinal stenosis. Patient was present for 28 hours.   New?Medications Started at [facility] Discharge:?? -started  methocarbamol (Robaxin) ondansetron (Zofran) oxyCODONE-acetaminophen (Percocet)   Medication Changes at Hospital Discharge: -Changed  None Medications Discontinued at Hospital Discharge: -Stopped acetaminophen 500 MG tablet (TYLENOL) FIBER ADULT GUMMIES PO HYDROcodone-acetaminophen 10-325 MG tablet (Norco) magnesium oxide 400 MG tablet (MAG-OX) meloxicam 15 MG tablet (MOBIC) multivitamin with minerals Tabs tablet oxyCODONE 5 MG immediate release tablet (Oxy IR/ROXICODONE) predniSONE 10 MG tablet (DELTASONE) VITAMIN D PO   Medications that remain the same after Hospital Discharge:??  -All other medications will remain the same.    Medications: Outpatient Encounter Medications as of 12/30/2021  Medication Sig   carbidopa-levodopa (SINEMET IR) 25-100 MG tablet TAKE 1 AND 1/2 TABLETS BY MOUTH THREE TIMES DAILY   citalopram (CELEXA) 20 MG tablet TAKE ONE TABLET BY MOUTH EVERYDAY AT BEDTIME   donepezil (ARICEPT) 10 MG tablet Take 1 tablet by mouth at bedtime.   dorzolamide-timolol (COSOPT) 22.3-6.8 MG/ML ophthalmic solution Instill 1 drop into both eyes twice a day   FIBER ADULT  GUMMIES PO Take 1 tablet by mouth 2 (two) times daily.   irbesartan (AVAPRO) 75 MG tablet Take 1 tablet (75 mg total) by mouth every morning.   L-Methylfolate-B12-B6-B2 (CEREFOLIN) 12-25-48-5 MG TABS Take 1 tablet by mouth daily.   latanoprost (XALATAN) 0.005 % ophthalmic solution Place 1 drop into the left eye daily.   Multiple Vitamins-Minerals (MULTIVITAMIN ADULTS 50+ PO) Take 1 tablet by mouth daily.   nitroGLYCERIN (NITROSTAT) 0.4 MG SL tablet Place 1 tablet (0.4 mg total) under the tongue every 5 (five) minutes as needed for chest pain.   pantoprazole (PROTONIX) 40 MG tablet Take 1 tablet (40 mg total) by mouth 2 (two) times daily.   Pitavastatin Calcium 2 MG TABS Take every other day   timolol (TIMOPTIC) 0.5 % ophthalmic solution Instill 1 drop into both eyes every morning   trihexyphenidyl (ARTANE) 2 MG tablet Take 1/2 tablet (1 mg total) by mouth 2 (two) times daily with a meal.   verapamil (CALAN-SR) 120 MG CR tablet Take 1 tablet (120 mg total) by mouth daily.   No facility-administered encounter medications on file as of 12/30/2021.   Reviewed chart for medication changes ahead of medication coordination call.  No OVs, Consults, or hospital visits since last care coordination call/Pharmacist visit.   No medication changes indicated   BP Readings from Last 3 Encounters:  12/10/21 124/78  12/05/21 120/80  10/09/21 130/82    No results found for: HGBA1C   Patient obtains medications through Adherence Packaging  30 Days   Last adherence delivery included:  Alfuzosin (Uroxatral) 10 mg : Take one tab at bedtime Verapamil (Calan-SR) 120 mg: Take one tab at breakfast Citalopram (Celexa) 20 mg: Take one tab at bedtime Trihexyphenidyl (Artane) 2 mg: Take half tab at breakfast  and half at dinner Carbidopa - Levodopa (Sinemet) 25-100 mg: Take one and a half tabs before breakfast, lunch and dinner Pantoprazole (Protonix) 40 mg: Take one tab at breakfast and dinner Irbesartan (Avapro)  75 mg: Take one tab at breakfast Pitavastatin Calcium (Livalo) 2 mg: Take one tab every other day at breakfast Donepezil (Aricept) 10 mg: Take one tab at bedtime   [name] Drops Due 01/17/22 Dorzolamide - timolol Due 01/01/22   Wife requested Celexa be added to packaging/ script sent over to pharm on 12/01/21 to be added to bedtime pack. Wife also made aware of change of Pitavastatin every other day, she reports he had still been doing twice a week, she reports he will begin doing every other day as of his next dose.   Wife Declined due to abundance on hand: Nitroglycerin (Nitrostat) 0.4 mg: Take one tab every 5 min PRN for chest pain ( 2 full vials)   Patient is due for next adherence delivery on: 01/12/22. Called patient and reviewed medications and coordinated delivery. Packs 30 DS  This delivery to include: Alfuzosin (Uroxatral) 10 mg : Take one tab at bedtime Verapamil (Calan-SR) 120 mg: Take one tab at breakfast Citalopram (Celexa) 20 mg: Take one tab at bedtime Trihexyphenidyl (Artane) 2 mg: Take half tab at breakfast and half at dinner Carbidopa - Levodopa (Sinemet) 25-100 mg: Take one and a half tabs before breakfast, lunch and dinner Pantoprazole (Protonix) 40 mg: Take one tab at breakfast and dinner Irbesartan (Avapro) 75 mg: Take one tab at breakfast Pitavastatin Calcium (Livalo) 2 mg: Take one tab every other day at breakfast Donepezil (Aricept) 10 mg: Take one tab at bedtime  [name] Drops Due 03/11/22 Dorzolamide - timolol Due 02/11/22    Confirmed delivery date of 01/12/22, advised patient that pharmacy will contact them the morning of delivery.   Care Gaps: BP- 124/78 ( 12/10/21) AWV- 2/22 CCM - Need July/Aug decl at this time  Star Rating Drugs: Irbesartan (Avapro) 75 mg - Last filled 12/08/21 38  DS at [facility] Pitavastatin 2 mg - Last filled 12/08/21 38 DS at [facility]   Patient Assistance: [name] for [place] Clinical Pharmacist Assistant [phone]

## 2022-01-21 ENCOUNTER — Ambulatory Visit: Payer: Medicare Other | Admitting: Surgery

## 2022-01-21 ENCOUNTER — Encounter: Payer: Self-pay | Admitting: Surgery

## 2022-01-21 VITALS — BP 142/88 | HR 67 | Temp 97.8°F | Wt 150.8 lb

## 2022-01-21 DIAGNOSIS — M6208 Separation of muscle (nontraumatic), other site: Secondary | ICD-10-CM

## 2022-01-21 DIAGNOSIS — K439 Ventral hernia without obstruction or gangrene: Secondary | ICD-10-CM

## 2022-01-21 NOTE — Progress Notes (Signed)
Request for Medical Clearance has been faxed to Dr Jill Alexanders.

## 2022-01-21 NOTE — Patient Instructions (Signed)
Ventral Hernia  You have requested for your Umbilical Hernia be repaired. This has been scheduled on 02/26/22 by Dr. Hampton Abbot at [facility]. Please see your (blue)pre-care sheet for information. Our surgery scheduler will call you to verify surgery date and to go over information.  You will need to arrange to be off work for 1-2 weeks but will have to have a lifting restriction of no more than 15 lbs for 6 weeks following your surgery. If you have FMLA or disability paperwork that needs filled out you may drop this off at our office or this can be faxed to [phone]. Umbilical Hernia, Adult A hernia is a bulge of tissue that pushes through an opening between muscles. An umbilical hernia happens in the abdomen, near the belly button (umbilicus). The hernia may contain tissues from the small intestine, large intestine, or fatty tissue covering the intestines (omentum). Umbilical hernias in adults tend to get worse over time, and they require surgical treatment. There are several types of umbilical hernias. You may have: A hernia located just above or below the umbilicus (indirect hernia). This is the most common type of umbilical hernia in adults. A hernia that forms through an opening formed by the umbilicus (direct hernia). A hernia that comes and goes (reducible hernia). A reducible hernia may be visible only when you strain, lift something heavy, or cough. This type of hernia can be pushed back into the abdomen (reduced). A hernia that traps abdominal tissue inside the hernia (incarcerated hernia). This type of hernia cannot be reduced. A hernia that cuts off blood flow to the tissues inside the hernia (strangulated hernia). The tissues can start to die if this happens. This type of hernia requires emergency treatment. What are the causes? An umbilical hernia happens when tissue inside the abdomen presses on a weak area of the abdominal muscles. What increases the risk? You may have a  greater risk of this condition if you: Are obese. Have had several pregnancies. Have a buildup of fluid inside your abdomen (ascites). Have had surgery that weakens the abdominal muscles. What are the signs or symptoms? The main symptom of this condition is a painless bulge at or near the belly button. A reducible hernia may be visible only when you strain, lift something heavy, or cough. Other symptoms may include: Dull pain. A feeling of pressure. Symptoms of a strangulated hernia may include: Pain that gets increasingly worse. Nausea and vomiting. Pain when pressing on the hernia. Skin over the hernia becoming red or purple. Constipation. Blood in the stool.  How is this diagnosed? This condition may be diagnosed based on: A physical exam. You may be asked to cough or strain while standing. These actions increase the pressure inside your abdomen and force the hernia through the opening in your muscles. Your health care provider may try to reduce the hernia by pressing on it. Your symptoms and medical history.  How is this treated? Surgery is the only treatment for an umbilical hernia. Surgery for a strangulated hernia is done as soon as possible. If you have a small hernia that is not incarcerated, you may need to lose weight before having surgery. Follow these instructions at home: Lose weight, if told by your health care provider. Do not try to push the hernia back in. Watch your hernia for any changes in color or size. Tell your health care provider if any changes occur. You may need to avoid activities that increase pressure on your hernia. Do  not lift anything that is heavier than 10 lb (4.5 kg) until your health care provider says that this is safe. Take over-the-counter and prescription medicines only as told by your health care provider. Keep all follow-up visits as told by your health care provider. This is important. Contact a health care provider if: Your hernia gets  larger. Your hernia becomes painful. Get help right away if: You develop sudden, severe pain near the area of your hernia. You have pain as well as nausea or vomiting. You have pain and the skin over your hernia changes color. You develop a fever. This information is not intended to replace advice given to you by your health care provider. Make sure you discuss any questions you have with your health care provider. Document Released: 12/13/2015 Document Revised: 03/15/2016 Document Reviewed: 12/13/2015 Elsevier Interactive Patient Education  [employer].

## 2022-01-21 NOTE — Progress Notes (Signed)
01/21/2022  History of Present Illness: [name] is a 71 y.o. male presenting for follow up of two ventral hernias and mild diastasis recti.  He was last seen on 03/17/22 for this, but at the time, he wanted to wait for surgery as he was dealing with back issues.  He had back surgery at the end of last year and he reports that he's recovering well.  However, he does feel that his hernias are being more aggravating recently.  He denies significantly worsening pain, but reports more discomfort in the area when the hernias bulge and that the bulging may be larger.  Denies any nausea, vomiting, other areas of abdominal discomfort, constipation, or diarrhea.    Of note, the patient reminds me of his diagnosis of Parkinson's and that during his back surgery admission, he had issues with delirium and he's worried about that happening again.  Past Medical History: Past Medical History:  Diagnosis Date   Allergy    Arthritis    BPH (benign prostatic hyperplasia)    Complication of anesthesia    Pt. stated he had a reaction that ended in him requiring urinary cath placement   Diverticulosis    GERD (gastroesophageal reflux disease)    esophageal spasms   Glaucoma    Gout    Head injury, closed, with concussion    Hepatitis C    chronic - Has been treated with Harvoni   HLD (hyperlipidemia)    statin intolerant (Crestor & Simvastatin) - Taking Livalo 3m / week   Hypertension    [place]inson's disease [place]    Plantar fasciitis    right   [place]VD [place]    With no claudication; only mild abdominal aortic atherosclerosis noted on ultrasound.   Spinal stenosis of lumbar region    Thoracic ascending aortic aneurysm (HCC)    4.2 cm ascending TAA 09/2016 CT, 1 yr f/u rec   Ulcer      Past Surgical History: Past Surgical History:  Procedure Laterality Date   BUNIONECTOMY WITH WEIL OSTEOTOMY Right 11/02/2019   Procedure: Right Foot Lapidus, Modified McBride  Bunionectomy,  Hallux Akin Osteotomy;  Surgeon: HWylene Simmer MD;  [place] [facility]  Service: Orthopedics;  Laterality: Right;   CARDIAC CATHETERIZATION  2005   30% Cx. Dr. GMelvern Banker  cataract surgery Left 11/05/2014   COLONOSCOPY     COLONOSCOPY N/A 01/09/2021   Procedure: COLONOSCOPY;  Surgeon: KRonnette Juniper MD;  Location: [facility];  Service: Gastroenterology;  Laterality: N/A;   ESOPHAGOGASTRODUODENOSCOPY N/A 05/02/2015   Procedure: ESOPHAGOGASTRODUODENOSCOPY (EGD);  Surgeon: RRonald Lobo MD;  [facility];  Service: Endoscopy;  Laterality: N/A;   ESOPHAGOGASTRODUODENOSCOPY  05/02/2015   no source of pt chest pain endoscopically evident. small hiatal hernia.   ESOPHAGOGASTRODUODENOSCOPY (EGD) WITH PROPOFOL N/A 01/02/2021   Procedure: ESOPHAGOGASTRODUODENOSCOPY (EGD) WITH PROPOFOL;  Surgeon: MClarene Essex MD;  Location: [facility];  Service: Endoscopy;  Laterality: N/A;   EYE SURGERY     HARDWARE REMOVAL Right 11/02/2019   Procedure: Second Metatarsal Removal of Deep Implant and Rotational Osteotomy;  Surgeon: HWylene Simmer MD;  [place] [facility]  Service: Orthopedics;  Laterality: Right;   IR ANGIOGRAM SELECTIVE EACH ADDITIONAL VESSEL  01/04/2021   IR ANGIOGRAM SELECTIVE EACH ADDITIONAL VESSEL  01/04/2021   IR ANGIOGRAM VISCERAL SELECTIVE  01/04/2021   IR [place] VASC ACCESS RIGHT  01/04/2021   LUMBAR LAMINECTOMY/DECOMPRESSION MICRODISCECTOMY N/A 07/03/2021   Procedure: Lumbar three through five  decompression with lumbar three through five insitu fusion;  Surgeon: Melina Schools, MD;  Location: [place];  Service: Orthopedics;  Laterality: N/A;   MEMBRANE PEEL Left 03/14/2014   Procedure: MEMBRANE PEEL; ENDOLASER;  Surgeon: Hurman Horn, MD;  Location: [place];  Service: Ophthalmology;  Laterality: Left;   NM MYOVIEW LTD  10/2015   LOW RISK. Small, fixed basal lateral defect - likely diaphragmatic attenuation. EF 69%   PARS PLANA  VITRECTOMY Left 03/14/2014   Procedure: PARS PLANA VITRECTOMY WITH 25 GAUGE;  Surgeon: Hurman Horn, MD;  Location: [place];  Service: Ophthalmology;  Laterality: Left;   TONSILLECTOMY     TRANSTHORACIC ECHOCARDIOGRAM  01/07/2021   Normal EF 60 to 65%.  No R WMA.  GRII DD-moderately dilated LA..  Mildly dilated RV but normal function.  Normal RAP/CVP.  Trivial AI with mild to moderate sclerosis-no stenosis   UPPER GASTROINTESTINAL ENDOSCOPY     WEIL OSTEOTOMY Right 09/01/2017   Procedure: RIGHT GREAT TOE CHEVRON AND WEIL OSTEOTOMY 2ND METATARSAL;  Surgeon: Newt Minion, MD;  Location: [place];  Service: Orthopedics;  Laterality: Right;    Home Medications: Prior to Admission medications   Medication Sig Start Date End Date Taking? Authorizing Provider  carbidopa-levodopa (SINEMET IR) 25-100 MG tablet TAKE 1 AND 1/2 TABLETS BY MOUTH THREE TIMES DAILY 10/08/21 10/08/22 Yes McCue, Janett Billow, NP  citalopram (CELEXA) 20 MG tablet TAKE ONE TABLET BY MOUTH EVERYDAY AT BEDTIME 12/01/21  Yes Denita Lung, MD  donepezil (ARICEPT) 10 MG tablet Take 1 tablet by mouth at bedtime. 11/05/21  Yes McCue, Janett Billow, NP  dorzolamide-timolol (COSOPT) 22.3-6.8 MG/ML ophthalmic solution Instill 1 drop into both eyes twice a day 10/08/21  Yes   FIBER ADULT GUMMIES PO Take 1 tablet by mouth 2 (two) times daily.   Yes [provider]  irbesartan (AVAPRO) 75 MG tablet TAKE ONE TABLET BY MOUTH EVERY MORNING 12/30/21  Yes Denita Lung, MD  L-Methylfolate-B12-B6-B2 (CEREFOLIN) 12-25-48-5 MG TABS Take 1 tablet by mouth daily. 10/01/20  Yes Garvin Fila, MD  latanoprost (XALATAN) 0.005 % ophthalmic solution Place 1 drop into the left eye daily. 06/23/21 06/23/22 Yes Rankin, Clent Demark, MD  Multiple Vitamins-Minerals (MULTIVITAMIN ADULTS 50+ PO) Take 1 tablet by mouth daily.   Yes [provider]  nitroGLYCERIN (NITROSTAT) 0.4 MG SL tablet Place 1 tablet (0.4 mg total) under the tongue every 5 (five) minutes as needed  for chest pain. 10/10/21  Yes [name], MD  pantoprazole (PROTONIX) 40 MG tablet Take 1 tablet (40 mg total) by mouth 2 (two) times daily. 10/10/21  Yes Tysinger, Camelia Eng, PA-C  Pitavastatin Calcium 2 MG TABS Take every other day 11/04/21  Yes Denita Lung, MD  timolol (TIMOPTIC) 0.5 % ophthalmic solution Instill 1 drop into both eyes every morning 07/24/21  Yes   trihexyphenidyl (ARTANE) 2 MG tablet Take 1/2 tablet (1 mg total) by mouth 2 (two) times daily with a meal. 10/08/21  Yes McCue, Janett Billow, NP  verapamil (CALAN-SR) 120 MG CR tablet TAKE ONE TABLET BY MOUTH EVERY MORNING 12/30/21  Yes Denita Lung, MD    Allergies: Allergies  Allergen Reactions   Anesthesia S-I-40 [Propofol] Other (See Comments)    Review of Systems: Review of Systems  Constitutional:  Negative for chills and fever.  HENT:  Negative for hearing loss.   Respiratory:  Negative for shortness of breath.   Cardiovascular:  Negative for chest pain.  Gastrointestinal:  Positive for  abdominal pain. Negative for constipation, diarrhea, nausea and vomiting.  Genitourinary:  Negative for dysuria.  Musculoskeletal:  Positive for back pain and joint pain. Negative for myalgias.  Skin:  Negative for rash.  Neurological:  Negative for dizziness.  Psychiatric/Behavioral:  Negative for depression.     Physical Exam BP (!) 142/88   Pulse 67   Temp 97.8 F (36.6 C) (Oral)   Wt 150 lb 12.8 oz (68.4 kg)   BMI 21.64 kg/m  CONSTITUTIONAL: No acute distress, well nourished. HEENT:  Normocephalic, atraumatic, extraocular motion intact. NECK:  Trachea is midline, no jugular venous distention. RESPIRATORY:  Lungs are clear, and breath sounds are equal bilaterally. Normal respiratory effort without pathologic use of accessory muscles. CARDIOVASCULAR: Heart is regular without murmurs, gallops, or rubs. GI: The abdomen is soft, non-distended, with some discomfort in the midline.  The patient has a reducible umbilical  hernia that is small, about 1 cm in size, and superior to it, a reducible but symptomatic ventral hernia which is about 2.5 cm in size.  The patient also has diastasis recti of the upper abdomen for a width of about 3.5 cm.   MUSCULOSKELETAL:  The patient has a well healed back incision from his back surgery.  No peripheral edema and normal gait. NEUROLOGIC:  Motor and sensation is grossly normal.  Cranial nerves are grossly intact. PSYCH:  Alert and oriented to person, place and time. Affect is normal.  Labs/Imaging: Labs from 10/09/21: Na 137, K 4.5, Cl 101, CO2 23, BUN 20, Cr 1.20.  total bili 0.3, AST 18, ALT 11, Alk Phos 73, Albumin 4.5  WBC 3.7, Hgb 11.4, Hct 34.8, Plt 249.  Assessment and Plan: This is a 71 y.o. male with ventral hernia x 2 and diastasis recti.  --Discussed with the patient that we can still proceed with repairing the hernias and diastasis as discussed previously.  We can still plan for a robotic, minimally invasive approach.  He feels ready for surgery now and would like to wait until next month to proceed given other appointments and scheduling issues he has.  Discussed with him the plan for a robotic assisted ventral hernias repair with plication of diastasis recti.  Reviewed the surgery at length with him including the incisions, the risk of bleeding, infection, injury to surrounding structures, that this would be an outpatient surgery, the use of mesh to reinforce our repair, post-operative pain control, and he's willing to proceed. --As he and his wife are concerned about possible delirium with anesthesia, I will also order a consult with Anesthesia team so they can discuss the medications that can be used for this surgery. --Will also send for medical clearance. --Will schedule for 02/26/22.  I spent 40 minutes dedicated to the care of this patient on the date of this encounter to include pre-visit review of records, face-to-face time with the patient discussing diagnosis  and management, and any post-visit coordination of care.   Melvyn Neth, [facility]

## 2022-01-22 ENCOUNTER — Telehealth: Payer: Self-pay

## 2022-01-22 NOTE — Telephone Encounter (Signed)
Good morning. Should [name] have a pre op exam before his surgery, I have the clearance form and it is signed but they are also requesting records to be attached. Please advise [name]

## 2022-01-22 NOTE — Telephone Encounter (Signed)
Pt has been advised of Pre-Admission date/time, and Surgery date.  Surgery Date: 02/26/22 Preadmission Testing Date: 02/17/22 (phone 1P-5P)  Patient has been made aware to call [phone], between 1-3:00pm the day before surgery, to find out what time to arrive for surgery.

## 2022-01-28 ENCOUNTER — Telehealth: Payer: Self-pay | Admitting: Pharmacist

## 2022-01-28 DIAGNOSIS — M2041 Other hammer toe(s) (acquired), right foot: Secondary | ICD-10-CM | POA: Diagnosis not present

## 2022-01-28 DIAGNOSIS — M205X1 Other deformities of toe(s) (acquired), right foot: Secondary | ICD-10-CM | POA: Diagnosis not present

## 2022-01-28 DIAGNOSIS — M7741 Metatarsalgia, right foot: Secondary | ICD-10-CM | POA: Diagnosis not present

## 2022-01-28 DIAGNOSIS — M79671 Pain in right foot: Secondary | ICD-10-CM | POA: Diagnosis not present

## 2022-01-28 NOTE — Chronic Care Management (AMB) (Signed)
Chronic Care Management Pharmacy Assistant   Name: [name]  MRN: [id number] DOB: 25-Jan-1951  Reason for Encounter: Medication Review Medication Coordination   Recent office visits:  None  Recent consult visits:  01/21/22 Jesus Ree, MD (General Surg) - Patient presented for Ventral hernia without obstruction or gangrene. No medication changes.  Hospital visits:  Medication Reconciliation was completed by comparing discharge summary, patient's EMR and Pharmacy list, and upon discussion with patient.   Patient presented to [name] on  07/03/21 due to Spinal stenosis. Patient was present for 28 hours.   New?Medications Started at [facility] Discharge:?? -started  methocarbamol (Robaxin) ondansetron (Zofran) oxyCODONE-acetaminophen (Percocet)   Medication Changes at Hospital Discharge: -Changed  None Medications Discontinued at Hospital Discharge: -Stopped acetaminophen 500 MG tablet (TYLENOL) FIBER ADULT GUMMIES PO HYDROcodone-acetaminophen 10-325 MG tablet (Norco) magnesium oxide 400 MG tablet (MAG-OX) meloxicam 15 MG tablet (MOBIC) multivitamin with minerals Tabs tablet oxyCODONE 5 MG immediate release tablet (Oxy IR/ROXICODONE) predniSONE 10 MG tablet (DELTASONE) VITAMIN D PO   Medications that remain the same after Hospital Discharge:??  -All other medications will remain the same.      Medications: Outpatient Encounter Medications as of 01/28/2022  Medication Sig   carbidopa-levodopa (SINEMET IR) 25-100 MG tablet TAKE 1 AND 1/2 TABLETS BY MOUTH THREE TIMES DAILY   citalopram (CELEXA) 20 MG tablet TAKE ONE TABLET BY MOUTH EVERYDAY AT BEDTIME   donepezil (ARICEPT) 10 MG tablet Take 1 tablet by mouth at bedtime.   dorzolamide-timolol (COSOPT) 22.3-6.8 MG/ML ophthalmic solution Instill 1 drop into both eyes twice a day   FIBER ADULT GUMMIES PO Take 1 tablet by mouth 2 (two) times daily.   irbesartan (AVAPRO) 75 MG tablet TAKE ONE TABLET  BY MOUTH EVERY MORNING   L-Methylfolate-B12-B6-B2 (CEREFOLIN) 12-25-48-5 MG TABS Take 1 tablet by mouth daily.   latanoprost (XALATAN) 0.005 % ophthalmic solution Place 1 drop into the left eye daily.   Multiple Vitamins-Minerals (MULTIVITAMIN ADULTS 50+ PO) Take 1 tablet by mouth daily.   nitroGLYCERIN (NITROSTAT) 0.4 MG SL tablet Place 1 tablet (0.4 mg total) under the tongue every 5 (five) minutes as needed for chest pain.   pantoprazole (PROTONIX) 40 MG tablet Take 1 tablet (40 mg total) by mouth 2 (two) times daily.   Pitavastatin Calcium 2 MG TABS Take every other day   timolol (TIMOPTIC) 0.5 % ophthalmic solution Instill 1 drop into both eyes every morning   trihexyphenidyl (ARTANE) 2 MG tablet Take 1/2 tablet (1 mg total) by mouth 2 (two) times daily with a meal.   verapamil (CALAN-SR) 120 MG CR tablet TAKE ONE TABLET BY MOUTH EVERY MORNING   No facility-administered encounter medications on file as of 01/28/2022.   Reviewed chart for medication changes ahead of medication coordination call.  No OVs, Consults, or hospital visits since last care coordination call/Pharmacist visit.   No medication changes indicated.  BP Readings from Last 3 Encounters:  01/21/22 (!) 142/88  12/10/21 124/78  12/05/21 120/80    No results found for: "HGBA1C"   Patient obtains medications through Adherence Packaging  30 Days   Last adherence delivery included:  Alfuzosin (Uroxatral) 10 mg : Take one tab at bedtime Verapamil (Calan-SR) 120 mg: Take one tab at breakfast Citalopram (Celexa) 20 mg: Take one tab at bedtime Trihexyphenidyl (Artane) 2 mg: Take half tab at breakfast and half at dinner Carbidopa - Levodopa (Sinemet) 25-100 mg: Take one and a half tabs before breakfast, lunch and dinner  Pantoprazole (Protonix) 40 mg: Take one tab at breakfast and dinner Irbesartan (Avapro) 75 mg: Take one tab at breakfast Pitavastatin Calcium (Livalo) 2 mg: Take one tab every other day at  breakfast Donepezil (Aricept) 10 mg: Take one tab at bedtime   [name] Drops Due 03/11/22 Dorzolamide - timolol Due 02/11/22       Confirmed delivery date of 01/12/22, advised patient that pharmacy will contact them the morning of delivery.    Patient is due for next adherence delivery on: 02/10/22. Called patient and reviewed medications and coordinated delivery. Packs 30 DS  This delivery to include: Verapamil (Calan-SR) 120 mg: Take one tab at breakfast Citalopram (Celexa) 20 mg: Take one tab at bedtime Trihexyphenidyl (Artane) 2 mg: Take half tab at breakfast and half at dinner Carbidopa - Levodopa (Sinemet) 25-100 mg: Take one and a half tabs before breakfast, lunch and dinner Pantoprazole (Protonix) 40 mg: Take one tab at breakfast and dinner Irbesartan (Avapro) 75 mg: Take one tab at breakfast Pitavastatin Calcium (Livalo) 2 mg: Take one tab every other day at breakfast Donepezil (Aricept) 10 mg: Take one tab at bedtime Dorzolamide - timolol : One drop in each eye 2x daily  Alfuzosin : Call to Urology requested prescription be sent/ pt aware [name] Drops Due 03/11/22 Patient made aware   Patient declined the following medications (meds) due to (reason) Nitroglycerin patient reports that he is not needing as of now    Confirmed delivery date of 02/10/22, advised patient that pharmacy will contact them the morning of delivery.   Care Gaps: AWV- 2/22 CCM- Declined BP- 142/88 01/21/22  Star Rating Drugs: Irbesartan (Avapro) 75 mg - Last filled 01/06/22 38  DS at [facility] Pitavastatin 2 mg - Last filled 01/06/22 38 DS at [facility]   Patient Assistance: [name] for [name] Clinical Pharmacist Assistant [phone]

## 2022-01-28 NOTE — Progress Notes (Signed)
Medical Clearance has been received from Dr Redmond School. The patient is cleared at Low risk for surgery.

## 2022-01-30 ENCOUNTER — Ambulatory Visit: Payer: Medicare Other | Admitting: Family Medicine

## 2022-02-03 ENCOUNTER — Other Ambulatory Visit: Payer: Self-pay | Admitting: Family Medicine

## 2022-02-03 ENCOUNTER — Encounter: Payer: Self-pay | Admitting: Family Medicine

## 2022-02-03 ENCOUNTER — Ambulatory Visit (INDEPENDENT_AMBULATORY_CARE_PROVIDER_SITE_OTHER): Payer: Medicare Other | Admitting: Family Medicine

## 2022-02-03 VITALS — BP 116/70 | HR 52 | Temp 97.7°F | Wt 156.8 lb

## 2022-02-03 DIAGNOSIS — M7061 Trochanteric bursitis, right hip: Secondary | ICD-10-CM

## 2022-02-03 DIAGNOSIS — L858 Other specified epidermal thickening: Secondary | ICD-10-CM

## 2022-02-03 DIAGNOSIS — R220 Localized swelling, mass and lump, head: Secondary | ICD-10-CM | POA: Diagnosis not present

## 2022-02-03 DIAGNOSIS — D2339 Other benign neoplasm of skin of other parts of face: Secondary | ICD-10-CM | POA: Diagnosis not present

## 2022-02-03 DIAGNOSIS — L99 Other disorders of skin and subcutaneous tissue in diseases classified elsewhere: Secondary | ICD-10-CM | POA: Diagnosis not present

## 2022-02-03 HISTORY — PX: OTHER SURGICAL HISTORY: SHX169

## 2022-02-03 MED ORDER — LIDOCAINE-EPINEPHRINE 1 %-1:100000 IJ SOLN
10.0000 mL | Freq: Once | INTRAMUSCULAR | Status: AC
  Administered 2022-02-03: 10 mL via INTRADERMAL

## 2022-02-03 NOTE — Patient Instructions (Signed)
Heat for 20 minutes 3 times per day and take 2 Aleve twice per day for the next couple weeks and see if that will quiet it downz

## 2022-02-03 NOTE — Progress Notes (Signed)
   Subjective:    Patient ID: [name], male    DOB: 25-Jan-1951, 71 y.o.   MRN: [id number]  HPI He is here for evaluation of a lesion on his chin that has been slowly getting larger.  It does not cause any trouble.  Not drained anything or cause any pain.  He also complains of difficulty with right hip discomfort that usually bothers him when he is up walking around.  He does not note any pain when he lays on that side.  No numbness, tingling or weakness.   Review of Systems     Objective:   Physical Exam A 0.5 cm round slightly vascular lesion is noted on the left mid chin area. Slight discomfort to the right greater trochanter is noted.  Full motion of the hip without pain.  Negative straight leg raising.       Assessment & Plan:  Mass of chin - Plan: lidocaine-EPINEPHrine (XYLOCAINE W/EPI) 1 %-1:100000 (with pres) injection 10 mL  Trochanteric bursitis of right hip The lesion on the chin was injected with Xylocaine and epinephrine.  It was excised without difficulty and the base was cauterized with silver nitrate. Heat for 20 minutes 3 times per day and take 2 Aleve twice per day for the next couple weeks and see if that will quiet it down

## 2022-02-10 DIAGNOSIS — H538 Other visual disturbances: Secondary | ICD-10-CM | POA: Diagnosis not present

## 2022-02-17 ENCOUNTER — Encounter
Admission: RE | Admit: 2022-02-17 | Discharge: 2022-02-17 | Disposition: A | Discharge status: Home / Self Care | Payer: Medicare Other | Source: Ambulatory Visit | Attending: Surgery | Admitting: Surgery

## 2022-02-17 ENCOUNTER — Other Ambulatory Visit: Payer: Self-pay

## 2022-02-17 VITALS — Ht 70.0 in | Wt 160.0 lb

## 2022-02-17 DIAGNOSIS — I10 Essential (primary) hypertension: Secondary | ICD-10-CM

## 2022-02-17 DIAGNOSIS — D649 Anemia, unspecified: Secondary | ICD-10-CM

## 2022-02-17 DIAGNOSIS — R079 Chest pain, unspecified: Secondary | ICD-10-CM

## 2022-02-17 DIAGNOSIS — M2011 Hallux valgus (acquired), right foot: Secondary | ICD-10-CM

## 2022-02-17 IMAGING — CR DG LUMBAR SPINE 2-3V
2 series · 2 of 2 positions shown · non-contrast
Comparison: CT 01/04/2021

CLINICAL DATA: Intraoperative localization

EXAM:
LUMBAR SPINE - 2-3 VIEW

[lateral (1 of 2)]
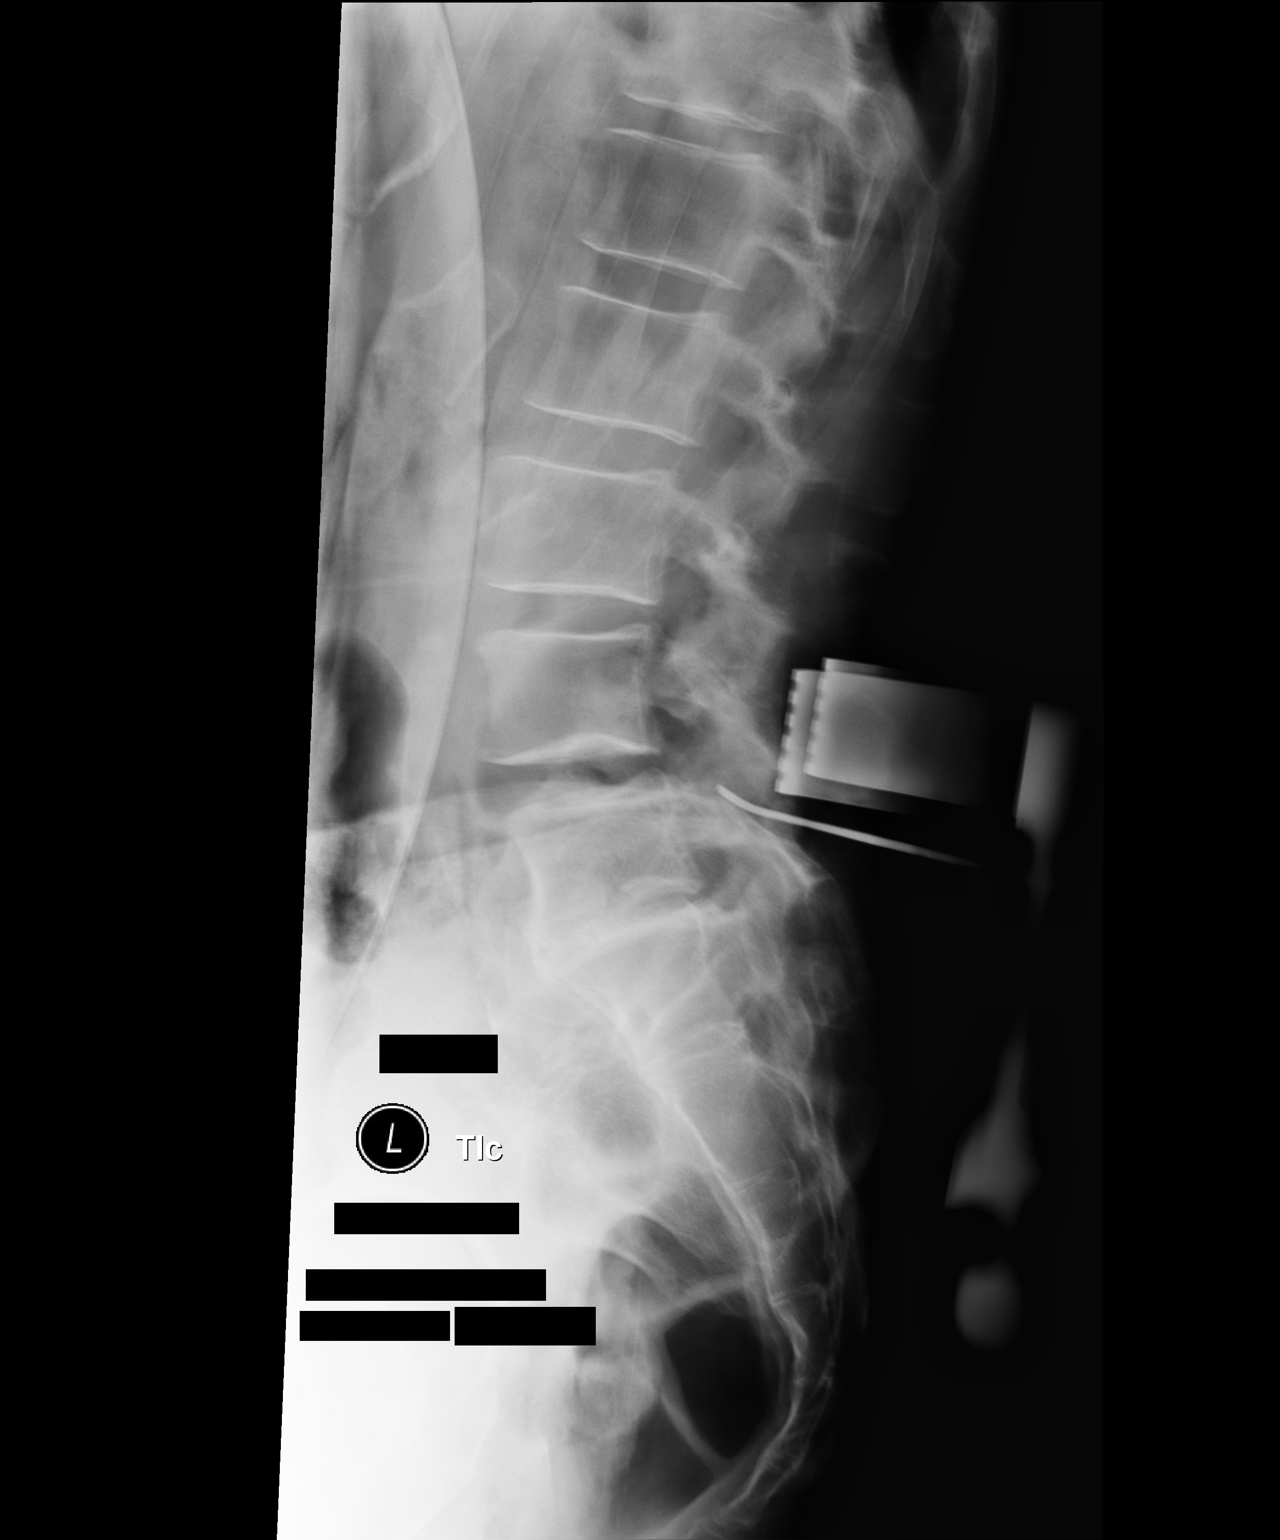

[lateral (2 of 2)]
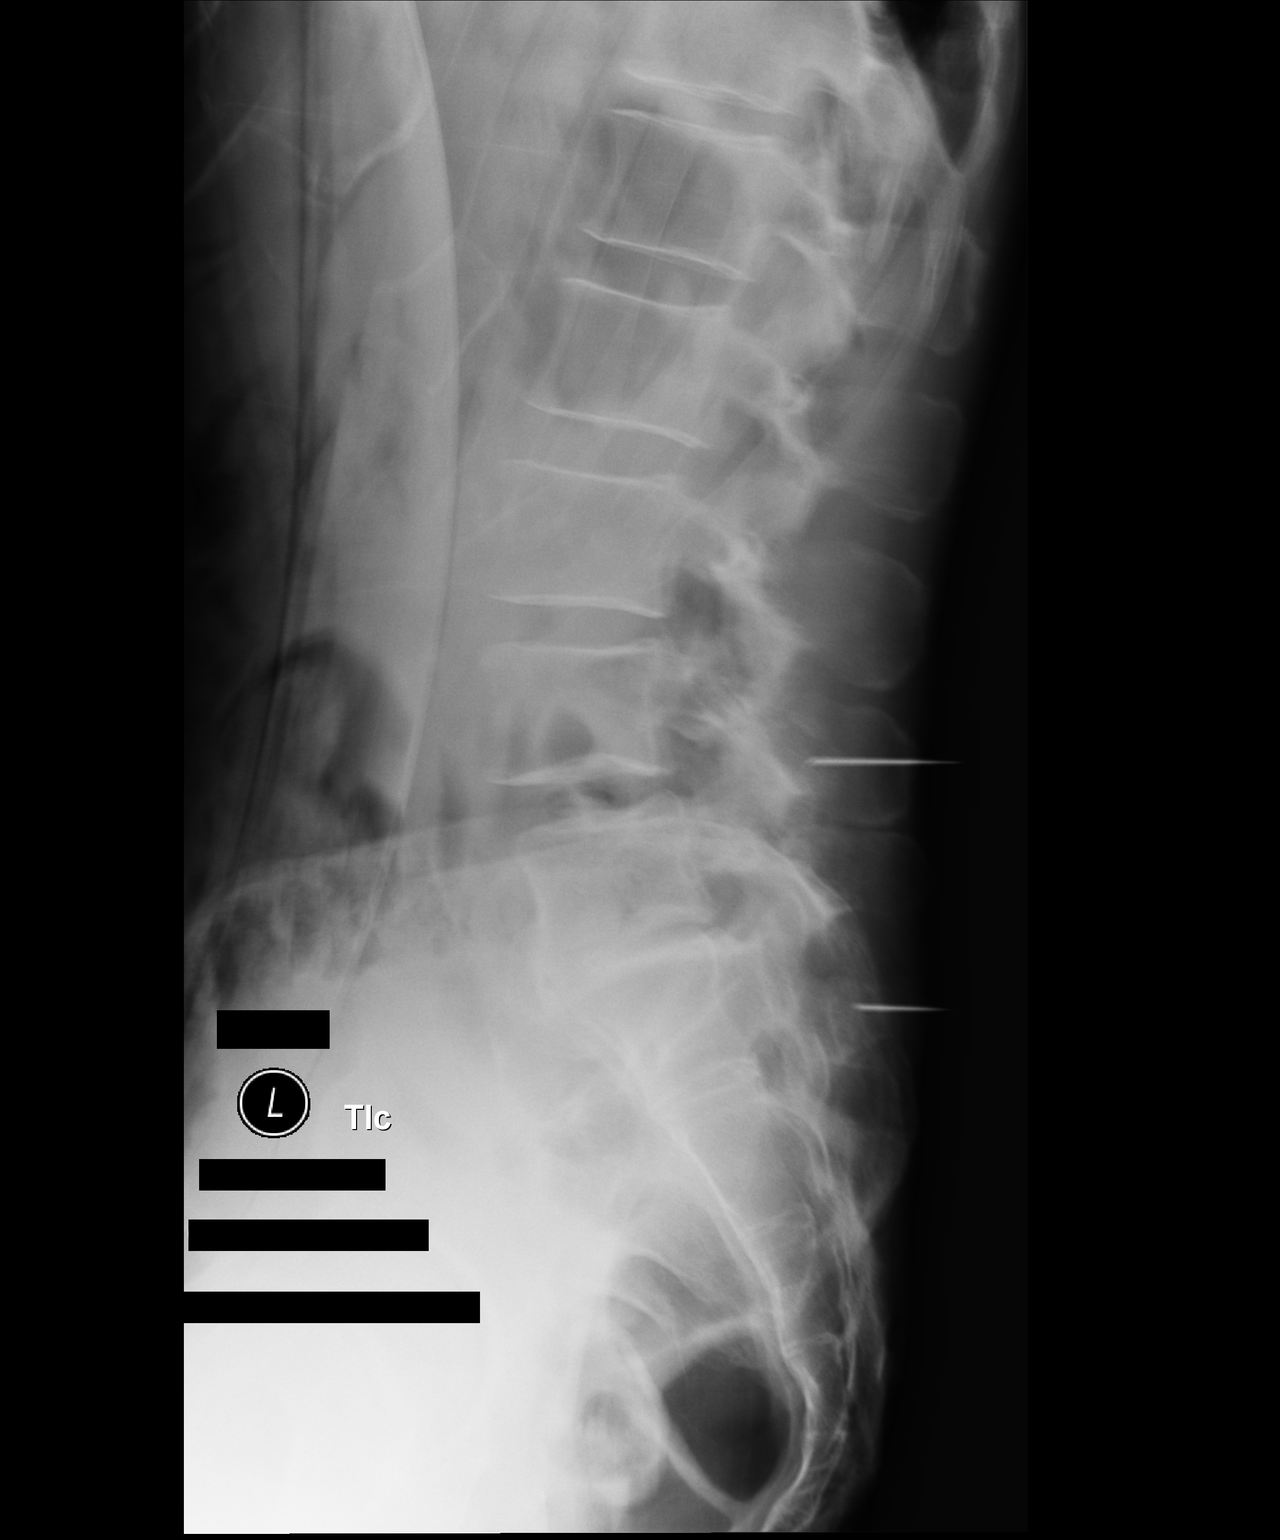

[2 of 2 positions shown; findings below may reference images not displayed]

FINDINGS: Two lateral intraoperative radiographs of the lumbar spine were
obtained. The image labeled "image 1" demonstrates instrumentation
projecting posteriorly to the L4-5 disc level and S1 vertebral
level. The image labeled "image 2" demonstrates surgical probe
posterior to the L4-5 intervertebral disc level.
IMPRESSION: Intraoperative radiographs as described. Surgical probe is posterior
to the L4-5 intervertebral disc level on image 2.

These results were called by telephone at the time of interpretation

## 2022-02-17 NOTE — Patient Instructions (Signed)
Your procedure is scheduled on: 02/26/22 Report to [facility]. To find out your arrival time please call [phone] between 1PM - 3PM on 02/25/22.  Remember: Instructions that are not followed completely may result in serious medical risk, up to and including death, or upon the discretion of your surgeon and anesthesiologist your surgery may need to be rescheduled.     _X__ 1. Do not eat food after midnight the night before your procedure.                 No gum chewing or hard candies. You may drink clear liquids up to 2 hours                 before you are scheduled to arrive for your surgery- DO not drink clear                 liquids within 2 hours of the start of your surgery.                 Clear Liquids include:  water, apple juice without pulp, clear carbohydrate                 drink such as Clearfast or Gatorade, Black Coffee or Tea (Do not add                 anything to coffee or tea). Diabetics water only  __X__2.  On the morning of surgery brush your teeth with toothpaste and water, you                 may rinse your mouth with mouthwash if you wish.  Do not swallow any              toothpaste of mouthwash.     _X__ 3.  No Alcohol for 24 hours before or after surgery.   _X__ 4.  Do Not Smoke or use e-cigarettes For 24 Hours Prior to Your Surgery.                 Do not use any chewable tobacco products for at least 6 hours prior to                 surgery.  ____  5.  Bring all medications with you on the day of surgery if instructed.   __X__  6.  Notify your doctor if there is any change in your medical condition      (cold, fever, infections).     Do not wear jewelry, make-up, hairpins, clips or nail polish. Do not wear lotions, powders, or perfumes.  Do not shave body hair 48 hours prior to surgery. Men may shave face and neck. Do not bring valuables to the hospital.    [facility] is not responsible for any  belongings or valuables.  Contacts, dentures/partials or body piercings may not be worn into surgery. Bring a case for your contacts, glasses or hearing aids, a denture cup will be supplied. Leave your suitcase in the car. After surgery it may be brought to your room. For patients admitted to the hospital, discharge time is determined by your treatment team.   Patients discharged the day of surgery will not be allowed to drive home.   Please read over the following fact sheets that you were given:   MRSA Information, chg soap  __X__ Take these medicines the morning of surgery with A SIP  OF WATER:    1. alfuzosin (UROXATRAL) 10 MG 24 hr tablet  2. carbidopa-levodopa (SINEMET IR) 25-100 MG tablet  3. pantoprazole (PROTONIX) 40 MG tablet  4. Pitavastatin Calcium 2 MG TABS   5. trihexyphenidyl (ARTANE) 2 MG tablet (1/2 TABLET)  6. verapamil (CALAN-SR) 120 MG CR tablet  ____ Fleet Enema (as directed)   __X__ Use CHG Soap/SAGE wipes as directed  ____ Use inhalers on the day of surgery  ____ Stop metformin/Janumet/Farxiga 2 days prior to surgery    ____ Take 1/2 of usual insulin dose the night before surgery. No insulin the morning          of surgery.   ____ Stop Blood Thinners Coumadin/Plavix/Xarelto/Pleta/Pradaxa/Eliquis/Effient/Aspirin  on   Or contact your Surgeon, Cardiologist or Medical Doctor regarding  ability to stop your blood thinners  __X__ Stop Anti-inflammatories 7 days before surgery such as Advil, Ibuprofen, Motrin,  BC or Goodies Powder, Naprosyn, Naproxen, Aleve, Aspirin    __X__ Stop all herbals and supplements, fish oil or vitamins for 1 week until after surgery.    ____ Bring C-Pap to the hospital.

## 2022-02-23 ENCOUNTER — Encounter: Payer: Self-pay | Admitting: Urgent Care

## 2022-02-23 ENCOUNTER — Telehealth: Payer: Self-pay

## 2022-02-23 ENCOUNTER — Encounter
Admission: RE | Admit: 2022-02-23 | Discharge: 2022-02-23 | Disposition: A | Discharge status: Home / Self Care | Payer: Medicare Other | Source: Ambulatory Visit | Attending: Surgery | Admitting: Surgery

## 2022-02-23 DIAGNOSIS — Z20822 Contact with and (suspected) exposure to covid-19: Secondary | ICD-10-CM | POA: Insufficient documentation

## 2022-02-23 DIAGNOSIS — Z01818 Encounter for other preprocedural examination: Secondary | ICD-10-CM | POA: Insufficient documentation

## 2022-02-23 DIAGNOSIS — I10 Essential (primary) hypertension: Secondary | ICD-10-CM | POA: Insufficient documentation

## 2022-02-23 DIAGNOSIS — R079 Chest pain, unspecified: Secondary | ICD-10-CM | POA: Insufficient documentation

## 2022-02-23 DIAGNOSIS — Z01812 Encounter for preprocedural laboratory examination: Secondary | ICD-10-CM

## 2022-02-23 DIAGNOSIS — M2011 Hallux valgus (acquired), right foot: Secondary | ICD-10-CM | POA: Insufficient documentation

## 2022-02-23 NOTE — Telephone Encounter (Signed)
Patients wife is calling-she states her husband has a sore throat, coughing, denies fever- she was advised to call pre-admissions for advise.

## 2022-02-23 NOTE — Telephone Encounter (Signed)
Spoke with Dr.Piscoya-postpone surgery for 2 weeks -be sure to have covid test- [name] will call patient to reschedule.  Patient and wife notified.

## 2022-02-24 ENCOUNTER — Telehealth: Payer: Self-pay | Admitting: Surgery

## 2022-02-24 LAB — SARS CORONAVIRUS 2 (TAT 6-24 HRS): SARS Coronavirus 2: NEGATIVE

## 2022-02-24 NOTE — Telephone Encounter (Signed)
Outgoing call, left message to call.  Please inform patient of the following regarding rescheduled surgery:   Pre-Admission date/time, and Surgery date.  Surgery Date: 03/17/22 Preadmission Testing Date: 03/09/22 (phone 8a-1p)  Also patient will need to call at [phone], between 1-3:00pm the day before surgery, to find out what time to arrive for surgery.

## 2022-02-25 ENCOUNTER — Telehealth: Payer: Medicare Other | Admitting: Medical

## 2022-02-25 MED ORDER — PROPOFOL 1000 MG/100ML IV EMUL
INTRAVENOUS | Status: AC
  Filled 2022-02-25: qty 200

## 2022-02-25 MED ORDER — LIDOCAINE HCL (PF) 2 % IJ SOLN
INTRAMUSCULAR | Status: AC
Start: 2022-02-25 — End: ?
  Filled 2022-02-25: qty 20

## 2022-02-25 MED ORDER — EPHEDRINE 5 MG/ML INJ
INTRAVENOUS | Status: AC
Start: 2022-02-25 — End: ?
  Filled 2022-02-25: qty 5

## 2022-02-25 MED ORDER — PHENYLEPHRINE HCL-NACL 20-0.9 MG/250ML-% IV SOLN
INTRAVENOUS | Status: AC
  Filled 2022-02-25: qty 250

## 2022-02-25 MED ORDER — PROPOFOL 1000 MG/100ML IV EMUL
INTRAVENOUS | Status: AC
  Filled 2022-02-25: qty 300

## 2022-02-25 NOTE — Telephone Encounter (Signed)
Patient's wife called back and was given updated surgery information.

## 2022-02-26 ENCOUNTER — Telehealth: Payer: Self-pay | Admitting: Pharmacist

## 2022-02-26 NOTE — Chronic Care Management (AMB) (Signed)
Chronic Care Management Pharmacy Assistant   Name: [name]  MRN: [id number] DOB: 1951-04-06   Reason for Encounter: Medication Review Medication Coordination   Recent office visits:  02/03/22 Denita Lung, MD - Patient presented for Mass of chin and other concerns. Lidocaine- Epi administered.  Recent consult visits:  02/23/22 [name], MD - Patient presented to [facility] for Pre-Op testing.  02/17/22 [name], MD - Patient presented to [facility] for Pre-Op testing.  Hospital visits:  Medication Reconciliation was completed by comparing discharge summary, patient's EMR and Pharmacy list, and upon discussion with patient.   Patient presented to [facility] on  07/03/21 due to Spinal stenosis. Patient was present for 28 hours.   New?Medications Started at [facility] Discharge:?? -started  methocarbamol (Robaxin) ondansetron (Zofran) oxyCODONE-acetaminophen (Percocet)   Medication Changes at Hospital Discharge: -Changed  None Medications Discontinued at Hospital Discharge: -Stopped acetaminophen 500 MG tablet (TYLENOL) FIBER ADULT GUMMIES PO HYDROcodone-acetaminophen 10-325 MG tablet (Norco) magnesium oxide 400 MG tablet (MAG-OX) meloxicam 15 MG tablet (MOBIC) multivitamin with minerals Tabs tablet oxyCODONE 5 MG immediate release tablet (Oxy IR/ROXICODONE) predniSONE 10 MG tablet (DELTASONE) VITAMIN D PO   Medications that remain the same after Hospital Discharge:??  -All other medications will remain the same.    Medications: Outpatient Encounter Medications as of 02/26/2022  Medication Sig   alfuzosin (UROXATRAL) 10 MG 24 hr tablet Take 10 mg by mouth daily.   carbidopa-levodopa (SINEMET IR) 25-100 MG tablet TAKE 1 AND 1/2 TABLETS BY MOUTH THREE TIMES DAILY   citalopram (CELEXA) 20 MG tablet TAKE ONE TABLET BY MOUTH EVERYDAY AT BEDTIME   donepezil (ARICEPT) 10 MG tablet Take 1 tablet by  mouth at bedtime.   dorzolamide-timolol (COSOPT) 22.3-6.8 MG/ML ophthalmic solution Instill 1 drop into both eyes twice a day   FIBER ADULT GUMMIES PO Take 1 tablet by mouth 2 (two) times daily.   irbesartan (AVAPRO) 75 MG tablet TAKE ONE TABLET BY MOUTH EVERY MORNING   latanoprost (XALATAN) 0.005 % ophthalmic solution Place 1 drop into the left eye daily.   Multiple Vitamins-Minerals (MULTIVITAMIN ADULTS 50+ PO) Take 1 tablet by mouth daily.   nitroGLYCERIN (NITROSTAT) 0.4 MG SL tablet Place 1 tablet (0.4 mg total) under the tongue every 5 (five) minutes as needed for chest pain.   pantoprazole (PROTONIX) 40 MG tablet Take 1 tablet (40 mg total) by mouth 2 (two) times daily.   Pitavastatin Calcium 2 MG TABS Take every other day   timolol (TIMOPTIC) 0.5 % ophthalmic solution Instill 1 drop into both eyes every morning (Patient not taking: Reported on 02/17/2022)   trihexyphenidyl (ARTANE) 2 MG tablet Take 1/2 tablet (1 mg total) by mouth 2 (two) times daily with a meal.   verapamil (CALAN-SR) 120 MG CR tablet TAKE ONE TABLET BY MOUTH EVERY MORNING   No facility-administered encounter medications on file as of 02/26/2022.   Reviewed chart for medication changes ahead of medication coordination call.  BP Readings from Last 3 Encounters:  02/03/22 116/70  01/21/22 (!) 142/88  12/10/21 124/78    No results found for: "HGBA1C"   Patient obtains medications through Adherence Packaging  30 Days   Last adherence delivery included:  Verapamil (Calan-SR) 120 mg: Take one tab at breakfast Citalopram (Celexa) 20 mg: Take one tab at bedtime Trihexyphenidyl (Artane) 2 mg: Take half tab at breakfast and half at dinner Carbidopa - Levodopa (Sinemet) 25-100 mg: Take one and a  half tabs before breakfast, lunch and dinner Pantoprazole (Protonix) 40 mg: Take one tab at breakfast and dinner Irbesartan (Avapro) 75 mg: Take one tab at breakfast Pitavastatin Calcium (Livalo) 2 mg: Take one tab every other day  at breakfast Donepezil (Aricept) 10 mg: Take one tab at bedtime Dorzolamide - timolol : One drop in each eye 2x daily   Alfuzosin : Call to Urology requested prescription be sent/ pt aware [name] Drops Due 03/11/22 Patient made aware     Patient declined the following medications (meds) due to (reason) Nitroglycerin patient reports that he is not needing as of now     Patient is due for next adherence delivery on: 03/11/22. Called patient and reviewed medications and coordinated delivery. Packs 30 DS  This delivery to include: Alfuzosin 10 mg : Take one at Bedtime Verapamil (Calan-SR) 120 mg: Take one tab at breakfast Citalopram (Celexa) 20 mg: Take one tab at bedtime Trihexyphenidyl (Artane) 2 mg: Take half tab at breakfast and half at dinner Carbidopa - Levodopa (Sinemet) 25-100 mg: Take one and a half tabs before breakfast, lunch and dinner Pantoprazole (Protonix) 40 mg: Take one tab at breakfast and dinner Irbesartan (Avapro) 75 mg: Take one tab at breakfast Pitavastatin Calcium (Livalo) 2 mg: Take one tab every other day at breakfast Donepezil (Aricept) 10 mg: Take one tab at bedtime Dorzolamide - timolol : One drop in each eye 2x daily Lantanoptost (Xaltan) Drops Due 03/11/22    Confirmed delivery date of 03/11/22, advised patient that pharmacy will contact them the morning of delivery.   Care Gaps: Flu Vaccine - Overdue AWV- 2/22 BP- 142/88 01/21/22 CCM-pt  wants to hold off until after surg  Star Rating Drugs: Irbesartan (Avapro) 75 mg - Last filled 02/02/22 38  DS at [facility] Pitavastatin 2 mg - Last filled 02/02/22 38 DS at [facility] Pharmacist Assistant [phone]

## 2022-03-03 ENCOUNTER — Other Ambulatory Visit: Payer: Self-pay | Admitting: *Deleted

## 2022-03-03 NOTE — Patient Outreach (Signed)
  Care Coordination   03/03/2022 Name: [name] MRN: [id number] DOB: 07/10/1951   Care Coordination Outreach Attempts:  An unsuccessful telephone outreach was attempted today to offer the patient information about available care coordination services as a benefit of their health plan.   Follow Up Plan:  Additional outreach attempts will be made to offer the patient care coordination information and services.   Encounter Outcome:  No Answer  Care Coordination Interventions Activated:  No   Care Coordination Interventions:  No, not indicated    Emelia Loron RN, BSN [place] [phone] [email or web address]

## 2022-03-05 ENCOUNTER — Other Ambulatory Visit: Payer: Self-pay | Admitting: Medical

## 2022-03-06 ENCOUNTER — Encounter: Payer: Self-pay | Admitting: Family Medicine

## 2022-03-06 ENCOUNTER — Telehealth (INDEPENDENT_AMBULATORY_CARE_PROVIDER_SITE_OTHER): Payer: Medicare Other | Admitting: Family Medicine

## 2022-03-06 VITALS — Temp 96.7°F | Wt 160.0 lb

## 2022-03-06 DIAGNOSIS — J069 Acute upper respiratory infection, unspecified: Secondary | ICD-10-CM | POA: Diagnosis not present

## 2022-03-06 NOTE — Progress Notes (Signed)
   Subjective:    Patient ID: TYSON PARKISON, male    DOB: [birth year], 71 y.o.   MRN: [id number]  HPI Documentation for virtual audio and video telecommunications through [facility] encounter: The patient was located at home. 2 patient identifiers used.  The provider was located in the office. The patient did consent to this visit and is aware of possible charges through their insurance for this visit. The other persons participating in this telemedicine service was he and his wife Time spent on call was 5 minutes and in review of previous records >20 minutes total for counseling and coordination of care. This virtual service is not related to other E/M service within previous 7 days.  He states that 2 weeks ago he started having difficulty with sore throat, cough, sinus and chest congestion with rhinorrhea he did a COVID test which was negative.  This is all cleared up except he is still now having a slightly dry cough but states that he is roughly 80% better.  He had to postpone hernia surgery last week because of this.  Review of Systems     Objective:   Physical Exam Alert and in no trance otherwise not examined      Assessment & Plan:   Viral URI with cough I explained that since he is roughly 80% better over the last week that he should go ahead and clear this up and not need any medication.  He was comfortable with that.  If things change he will call me.

## 2022-03-09 ENCOUNTER — Encounter
Admission: RE | Admit: 2022-03-09 | Discharge: 2022-03-09 | Disposition: A | Discharge status: Home / Self Care | Payer: Medicare Other | Source: Ambulatory Visit | Attending: Surgery | Admitting: Surgery

## 2022-03-09 NOTE — Patient Instructions (Addendum)
Your procedure is scheduled on: 03/17/22 Report to [place]. To find out your arrival time please call [phone] between 1PM - 3PM on 03/16/22.  Remember: Instructions that are not followed completely may result in serious medical risk, up to and including death, or upon the discretion of your surgeon and anesthesiologist your surgery may need to be rescheduled.     _X__ 1. Do not eat food after midnight the night before your procedure.                 No gum chewing or hard candies. You may drink clear liquids up to 2 hours                 before you are scheduled to arrive for your surgery- DO not drink clear                 liquids within 2 hours of the start of your surgery.                 Clear Liquids include:  water, apple juice without pulp, clear carbohydrate                 drink such as Clearfast or Gatorade, Black Coffee or Tea (Do not add                 anything to coffee or tea). Diabetics water only  __X__2.  On the morning of surgery brush your teeth with toothpaste and water, you                 may rinse your mouth with mouthwash if you wish.  Do not swallow any              toothpaste of mouthwash.     _X__ 3.  No Alcohol for 24 hours before or after surgery.   _X__ 4.  Do Not Smoke or use e-cigarettes For 24 Hours Prior to Your Surgery.                 Do not use any chewable tobacco products for at least 6 hours prior to                 surgery.  ____  5.  Bring all medications with you on the day of surgery if instructed.   __X__  6.  Notify your doctor if there is any change in your medical condition      (cold, fever, infections).     Do not wear jewelry, make-up, hairpins, clips or nail polish. Do not wear lotions, powders, or perfumes.  Do not shave body hair 48 hours prior to surgery. Men may shave face and neck. Do not bring valuables to the hospital.    [facility] is not responsible for any  belongings or valuables.  Contacts, dentures/partials or body piercings may not be worn into surgery. Bring a case for your contacts, glasses or hearing aids, a denture cup will be supplied. Leave your suitcase in the car. After surgery it may be brought to your room. For patients admitted to the hospital, discharge time is determined by your treatment team.   Patients discharged the day of surgery will not be allowed to drive home.   Please read over the following fact sheets that you were given:   MRSA Information, chg soap  __X__ Take these medicines the morning of surgery with A SIP  OF WATER:    1. alfuzosin (UROXATRAL) 10 MG 24 hr tablet  2. carbidopa-levodopa (SINEMET IR) 25-100 MG tablet  3. pantoprazole (PROTONIX) 40 MG tablet  4. Pitavastatin Calcium 2 MG TABS   5. trihexyphenidyl (ARTANE) 2 MG tablet (1/2 TABLET)  6. verapamil (CALAN-SR) 120 MG CR tablet  ____ Fleet Enema (as directed)   __X__ Use CHG Soap/SAGE wipes as directed  ____ Use inhalers on the day of surgery  ____ Stop metformin/Janumet/Farxiga 2 days prior to surgery    ____ Take 1/2 of usual insulin dose the night before surgery. No insulin the morning          of surgery.   ____ Stop Blood Thinners Coumadin/Plavix/Xarelto/Pleta/Pradaxa/Eliquis/Effient/Aspirin  on   Or contact your Surgeon, Cardiologist or Medical Doctor regarding  ability to stop your blood thinners  __X__ Stop Anti-inflammatories 7 days before surgery such as Advil, Ibuprofen, Motrin,  BC or Goodies Powder, Naprosyn, Naproxen, Aleve, Aspirin    __X__ Stop all herbals and supplements, fish oil or vitamins for 1 week until after surgery.    ____ Bring C-Pap to the hospital.

## 2022-03-09 NOTE — Pre-Procedure Instructions (Signed)
Spoke to patient's wife. No changes in medical or surgical history. No changes in medications. Spouse able to locate original instructions from 02/17/22 phone interview in my chart. She is aware there are no changes except date of surgery. Copy should be available with new surgery date as AVS for today's date in patients my chart, if not they are aware to follow instructions from 02/17/22. Spouse has phone number to reach PAT if needed. She is aware to call for any questions or concerns.

## 2022-03-16 ENCOUNTER — Ambulatory Visit: Payer: Self-pay

## 2022-03-16 NOTE — Patient Outreach (Signed)
  Care Coordination   03/16/2022 Name: [name] MRN: [id number] DOB: [birth year]   Care Coordination Outreach Attempts:  A second unsuccessful outreach was attempted today to offer the patient with information about available care coordination services as a benefit of their health plan.     Follow Up Plan:  Additional outreach attempts will be made to offer the patient care coordination information and services.   Encounter Outcome:  No Answer  Care Coordination Interventions Activated:  No   Care Coordination Interventions:  No, not indicated    Daneen Schick, BSW, CDP Social Worker, Certified Dementia Practitioner Care Coordination [phone]

## 2022-03-17 ENCOUNTER — Encounter
Admission: RE | Disposition: A | Discharge status: Home / Self Care | Payer: Self-pay | Source: Home / Self Care | Attending: Surgery

## 2022-03-17 ENCOUNTER — Encounter: Payer: Self-pay | Admitting: Surgery

## 2022-03-17 ENCOUNTER — Other Ambulatory Visit: Payer: Self-pay

## 2022-03-17 ENCOUNTER — Ambulatory Visit
Admission: RE | Admit: 2022-03-17 | Discharge: 2022-03-17 | Disposition: A | Discharge status: Home / Self Care | Payer: Medicare Other | Attending: Surgery | Admitting: Surgery

## 2022-03-17 ENCOUNTER — Ambulatory Visit: Payer: Medicare Other | Admitting: Urgent Care

## 2022-03-17 DIAGNOSIS — K429 Umbilical hernia without obstruction or gangrene: Secondary | ICD-10-CM | POA: Diagnosis not present

## 2022-03-17 DIAGNOSIS — D649 Anemia, unspecified: Secondary | ICD-10-CM

## 2022-03-17 DIAGNOSIS — Z20822 Contact with and (suspected) exposure to covid-19: Secondary | ICD-10-CM | POA: Diagnosis not present

## 2022-03-17 DIAGNOSIS — K439 Ventral hernia without obstruction or gangrene: Secondary | ICD-10-CM | POA: Diagnosis not present

## 2022-03-17 DIAGNOSIS — M6208 Separation of muscle (nontraumatic), other site: Secondary | ICD-10-CM | POA: Diagnosis not present

## 2022-03-17 HISTORY — PX: XI ROBOTIC ASSISTED VENTRAL HERNIA: SHX6789

## 2022-03-17 LAB — CBC
HCT: 36.4 % — ABNORMAL LOW (ref 39.0–52.0)
Hemoglobin: 11.8 g/dL — ABNORMAL LOW (ref 13.0–17.0)
MCH: 28.2 pg (ref 26.0–34.0)
MCHC: 32.4 g/dL (ref 30.0–36.0)
MCV: 87.1 fL (ref 80.0–100.0)
Platelets: 218 10*3/uL (ref 150–400)
RBC: 4.18 MIL/uL — ABNORMAL LOW (ref 4.22–5.81)
RDW: 13 % (ref 11.5–15.5)
WBC: 5.1 10*3/uL (ref 4.0–10.5)
nRBC: 0 % (ref 0.0–0.2)

## 2022-03-17 SURGERY — REPAIR, HERNIA, VENTRAL, ROBOT-ASSISTED
Anesthesia: General

## 2022-03-17 MED ORDER — FENTANYL CITRATE (PF) 100 MCG/2ML IJ SOLN
INTRAMUSCULAR | Status: DC | PRN
Start: 2022-03-17 — End: 2022-03-17
  Administered 2022-03-17 (×2): 50 ug via INTRAVENOUS

## 2022-03-17 MED ORDER — ESMOLOL HCL 100 MG/10ML IV SOLN
INTRAVENOUS | Status: DC | PRN
  Administered 2022-03-17: 30 mg via INTRAVENOUS

## 2022-03-17 MED ORDER — FENTANYL CITRATE (PF) 100 MCG/2ML IJ SOLN
INTRAMUSCULAR | Status: AC
  Filled 2022-03-17: qty 2

## 2022-03-17 MED ORDER — ONDANSETRON HCL 4 MG/2ML IJ SOLN
INTRAMUSCULAR | Status: DC | PRN
  Administered 2022-03-17 (×2): 4 mg via INTRAVENOUS

## 2022-03-17 MED ORDER — ACETAMINOPHEN 500 MG PO TABS: 1000.0000 mg | ORAL_TABLET | ORAL | Status: AC

## 2022-03-17 MED ORDER — CEFAZOLIN SODIUM-DEXTROSE 2-4 GM/100ML-% IV SOLN
INTRAVENOUS | Status: AC
  Filled 2022-03-17: qty 100

## 2022-03-17 MED ORDER — CEFAZOLIN SODIUM-DEXTROSE 2-4 GM/100ML-% IV SOLN
2.0000 g | INTRAVENOUS | Status: AC
  Administered 2022-03-17: 2 g via INTRAVENOUS

## 2022-03-17 MED ORDER — OXYCODONE HCL 5 MG PO TABS: 5.0000 mg | ORAL_TABLET | ORAL | 0 refills | Status: DC | PRN

## 2022-03-17 MED ORDER — ACETAMINOPHEN 500 MG PO TABS
ORAL_TABLET | ORAL | Status: AC
  Administered 2022-03-17: 1000 mg via ORAL
  Filled 2022-03-17: qty 2

## 2022-03-17 MED ORDER — LIDOCAINE HCL (CARDIAC) PF 100 MG/5ML IV SOSY
PREFILLED_SYRINGE | INTRAVENOUS | Status: DC | PRN
  Administered 2022-03-17: 100 mg via INTRAVENOUS

## 2022-03-17 MED ORDER — GLYCOPYRROLATE 0.2 MG/ML IJ SOLN
INTRAMUSCULAR | Status: DC | PRN
  Administered 2022-03-17: .2 mg via INTRAVENOUS

## 2022-03-17 MED ORDER — BUPIVACAINE LIPOSOME 1.3 % IJ SUSP: 20.0000 mL | Freq: Once | INTRAMUSCULAR | Status: DC

## 2022-03-17 MED ORDER — DEXAMETHASONE SODIUM PHOSPHATE 10 MG/ML IJ SOLN
INTRAMUSCULAR | Status: DC | PRN
  Administered 2022-03-17: 10 mg via INTRAVENOUS

## 2022-03-17 MED ORDER — OXYCODONE HCL 5 MG PO TABS: 5.0000 mg | ORAL_TABLET | Freq: Once | ORAL | Status: DC | PRN

## 2022-03-17 MED ORDER — SODIUM CHLORIDE (PF) 0.9 % IJ SOLN
INTRAMUSCULAR | Status: DC | PRN
  Administered 2022-03-17: 60 mL

## 2022-03-17 MED ORDER — MIDAZOLAM HCL 2 MG/2ML IJ SOLN
INTRAMUSCULAR | Status: AC
  Filled 2022-03-17: qty 2

## 2022-03-17 MED ORDER — SUGAMMADEX SODIUM 500 MG/5ML IV SOLN
INTRAVENOUS | Status: DC | PRN
  Administered 2022-03-17: 500 mg via INTRAVENOUS

## 2022-03-17 MED ORDER — PROPOFOL 10 MG/ML IV BOLUS
INTRAVENOUS | Status: AC
  Filled 2022-03-17: qty 20

## 2022-03-17 MED ORDER — FENTANYL CITRATE (PF) 100 MCG/2ML IJ SOLN
25.0000 ug | INTRAMUSCULAR | Status: DC | PRN
  Administered 2022-03-17 (×2): 25 ug via INTRAVENOUS

## 2022-03-17 MED ORDER — PROPOFOL 10 MG/ML IV BOLUS
INTRAVENOUS | Status: DC | PRN
  Administered 2022-03-17: 120 mg via INTRAVENOUS
  Administered 2022-03-17: 20 mg via INTRAVENOUS
  Administered 2022-03-17: 30 mg via INTRAVENOUS

## 2022-03-17 MED ORDER — IBUPROFEN 600 MG PO TABS: 600.0000 mg | ORAL_TABLET | Freq: Three times a day (TID) | ORAL | 1 refills | Status: DC | PRN

## 2022-03-17 MED ORDER — OXYCODONE HCL 5 MG/5ML PO SOLN: 5.0000 mg | Freq: Once | ORAL | Status: DC | PRN

## 2022-03-17 MED ORDER — ACETAMINOPHEN 500 MG PO TABS: 1000.0000 mg | ORAL_TABLET | Freq: Four times a day (QID) | ORAL | Status: AC | PRN

## 2022-03-17 MED ORDER — PHENYLEPHRINE HCL-NACL 20-0.9 MG/250ML-% IV SOLN
INTRAVENOUS | Status: DC | PRN
  Administered 2022-03-17: 20 ug/min via INTRAVENOUS

## 2022-03-17 MED ORDER — CHLORHEXIDINE GLUCONATE CLOTH 2 % EX PADS: 6.0000 | MEDICATED_PAD | Freq: Once | CUTANEOUS | Status: DC

## 2022-03-17 MED ORDER — FENTANYL CITRATE (PF) 100 MCG/2ML IJ SOLN
INTRAMUSCULAR | Status: AC
  Administered 2022-03-17: 50 ug via INTRAVENOUS
  Filled 2022-03-17: qty 2

## 2022-03-17 MED ORDER — BUPIVACAINE-EPINEPHRINE (PF) 0.5% -1:200000 IJ SOLN
INTRAMUSCULAR | Status: AC
  Filled 2022-03-17: qty 30

## 2022-03-17 MED ORDER — LACTATED RINGERS IV SOLN: INTRAVENOUS | Status: DC

## 2022-03-17 MED ORDER — ROCURONIUM BROMIDE 100 MG/10ML IV SOLN
INTRAVENOUS | Status: DC | PRN
  Administered 2022-03-17: 30 mg via INTRAVENOUS
  Administered 2022-03-17: 60 mg via INTRAVENOUS

## 2022-03-17 MED ORDER — BUPIVACAINE LIPOSOME 1.3 % IJ SUSP
INTRAMUSCULAR | Status: AC
Start: 2022-03-17 — End: ?
  Filled 2022-03-17: qty 20

## 2022-03-17 MED ORDER — CHLORHEXIDINE GLUCONATE 0.12 % MT SOLN: 15.0000 mL | Freq: Once | OROMUCOSAL | Status: AC

## 2022-03-17 MED ORDER — CHLORHEXIDINE GLUCONATE 0.12 % MT SOLN
OROMUCOSAL | Status: AC
  Administered 2022-03-17: 15 mL via OROMUCOSAL
  Filled 2022-03-17: qty 15

## 2022-03-17 MED ORDER — SODIUM CHLORIDE FLUSH 0.9 % IV SOLN
INTRAVENOUS | Status: AC
  Filled 2022-03-17: qty 10

## 2022-03-17 MED ORDER — PROPOFOL 500 MG/50ML IV EMUL
INTRAVENOUS | Status: DC | PRN
  Administered 2022-03-17: 145 ug/kg/min via INTRAVENOUS

## 2022-03-17 MED ORDER — ONDANSETRON HCL 4 MG/2ML IJ SOLN: 4.0000 mg | Freq: Once | INTRAMUSCULAR | Status: DC | PRN

## 2022-03-17 MED ORDER — CELECOXIB 200 MG PO CAPS: 200.0000 mg | ORAL_CAPSULE | ORAL | Status: AC

## 2022-03-17 MED ORDER — CELECOXIB 200 MG PO CAPS
ORAL_CAPSULE | ORAL | Status: AC
  Administered 2022-03-17: 200 mg via ORAL
  Filled 2022-03-17: qty 1

## 2022-03-17 MED ORDER — ORAL CARE MOUTH RINSE: 15.0000 mL | Freq: Once | OROMUCOSAL | Status: AC

## 2022-03-17 MED ORDER — PROPOFOL 1000 MG/100ML IV EMUL
INTRAVENOUS | Status: AC
  Filled 2022-03-17: qty 100

## 2022-03-17 SURGICAL SUPPLY — 63 items
BLADE SURG SZ11 CARB STEEL (BLADE) ×1 IMPLANT
CANNULA REDUC XI 12-8 STAPL (CANNULA) ×1
CANNULA REDUCER 12-8 DVNC XI (CANNULA) ×1 IMPLANT
COVER TIP SHEARS 8 DVNC (MISCELLANEOUS) ×1 IMPLANT
COVER TIP SHEARS 8MM DA VINCI (MISCELLANEOUS) ×1
COVER WAND RF STERILE (DRAPES) ×1 IMPLANT
DEFOGGER SCOPE WARMER CLEARIFY (MISCELLANEOUS) IMPLANT
DERMABOND ADVANCED (GAUZE/BANDAGES/DRESSINGS) ×1
DERMABOND ADVANCED .7 DNX12 (GAUZE/BANDAGES/DRESSINGS) ×1 IMPLANT
DRAPE ARM DVNC X/XI (DISPOSABLE) ×3 IMPLANT
DRAPE COLUMN DVNC XI (DISPOSABLE) ×1 IMPLANT
DRAPE DA VINCI XI ARM (DISPOSABLE) ×3
DRAPE DA VINCI XI COLUMN (DISPOSABLE) ×1
ELECT CAUTERY BLADE TIP 2.5 (TIP) ×1
ELECT REM PT RETURN 9FT ADLT (ELECTROSURGICAL) ×1
ELECTRODE CAUTERY BLDE TIP 2.5 (TIP) ×1 IMPLANT
ELECTRODE REM PT RTRN 9FT ADLT (ELECTROSURGICAL) ×1 IMPLANT
GLOVE SURG SYN 7.0 (GLOVE) ×3 IMPLANT
GLOVE SURG SYN 7.0 PF PI (GLOVE) ×2 IMPLANT
GLOVE SURG SYN 7.5  E (GLOVE) ×3
GLOVE SURG SYN 7.5 E (GLOVE) ×3 IMPLANT
GLOVE SURG SYN 7.5 PF PI (GLOVE) ×2 IMPLANT
GOWN STRL REUS W/ TWL LRG LVL3 (GOWN DISPOSABLE) ×3 IMPLANT
GOWN STRL REUS W/TWL LRG LVL3 (GOWN DISPOSABLE) ×3
GRASPER SUT TROCAR 14GX15 (MISCELLANEOUS) ×1 IMPLANT
IRRIGATION STRYKERFLOW (MISCELLANEOUS) IMPLANT
IRRIGATOR STRYKERFLOW (MISCELLANEOUS)
IV NS 1000ML (IV SOLUTION)
IV NS 1000ML BAXH (IV SOLUTION) IMPLANT
KIT PINK PAD W/HEAD ARE REST (MISCELLANEOUS) ×1
KIT PINK PAD W/HEAD ARM REST (MISCELLANEOUS) ×1 IMPLANT
LABEL OR SOLS (LABEL) ×1 IMPLANT
MANIFOLD NEPTUNE II (INSTRUMENTS) ×1 IMPLANT
MESH VENT LT ST 15CM CRL ECHO2 (Mesh General) IMPLANT
NDL INSUFFLATION 14GA 120MM (NEEDLE) ×1 IMPLANT
NEEDLE HYPO 22GX1.5 SAFETY (NEEDLE) ×1 IMPLANT
NEEDLE INSUFFLATION 14GA 120MM (NEEDLE) ×1 IMPLANT
OBTURATOR OPTICAL STANDARD 8MM (TROCAR) ×1
OBTURATOR OPTICAL STND 8 DVNC (TROCAR) ×1
OBTURATOR OPTICALSTD 8 DVNC (TROCAR) ×1 IMPLANT
PACK LAP CHOLECYSTECTOMY (MISCELLANEOUS) ×1 IMPLANT
PENCIL SMOKE EVACUATOR (MISCELLANEOUS) IMPLANT
SEAL CANN UNIV 5-8 DVNC XI (MISCELLANEOUS) ×2 IMPLANT
SEAL XI 5MM-8MM UNIVERSAL (MISCELLANEOUS) ×2
SET TUBE SMOKE EVAC HIGH FLOW (TUBING) ×1 IMPLANT
SOLUTION ELECTROLUBE (MISCELLANEOUS) ×1 IMPLANT
SPONGE T-LAP 18X18 ~~LOC~~+RFID (SPONGE) ×1 IMPLANT
STAPLER CANNULA SEAL DVNC XI (STAPLE) ×1 IMPLANT
STAPLER CANNULA SEAL XI (STAPLE) ×1
SUT MNCRL 4-0 (SUTURE) ×1
SUT MNCRL 4-0 27XMFL (SUTURE) ×1
SUT STRATAFIX PDS 30 CT-1 (SUTURE) ×1 IMPLANT
SUT VIC AB 3-0 SH 27 (SUTURE) ×1
SUT VIC AB 3-0 SH 27X BRD (SUTURE) IMPLANT
SUT VICRYL 0 AB UR-6 (SUTURE) ×2 IMPLANT
SUT VLOC 90 2/L VL 12 GS22 (SUTURE) ×2 IMPLANT
SUTURE MNCRL 4-0 27XMF (SUTURE) ×1 IMPLANT
SYS BAG RETRIEVAL 10MM (BASKET) ×1
SYSTEM BAG RETRIEVAL 10MM (BASKET) IMPLANT
TAPE TRANSPORE STRL 2 31045 (GAUZE/BANDAGES/DRESSINGS) ×1 IMPLANT
TRAP FLUID SMOKE EVACUATOR (MISCELLANEOUS) ×1 IMPLANT
TRAY FOLEY SLVR 16FR LF STAT (SET/KITS/TRAYS/PACK) ×1 IMPLANT
WATER STERILE IRR 500ML POUR (IV SOLUTION) ×1 IMPLANT

## 2022-03-17 NOTE — Anesthesia Postprocedure Evaluation (Signed)
Anesthesia Post Note  Patient: [name]  Procedure(s) Performed: XI ROBOTIC ASSISTED VENTRAL HERNIA  Patient location during evaluation: PACU Anesthesia Type: General Level of consciousness: awake and alert Pain management: pain level controlled Vital Signs Assessment: post-procedure vital signs reviewed and stable Respiratory status: spontaneous breathing, nonlabored ventilation, respiratory function stable and patient connected to nasal cannula oxygen Cardiovascular status: blood pressure returned to baseline and stable Postop Assessment: no apparent nausea or vomiting Anesthetic complications: no   No notable events documented.   Last Vitals:  Vitals:   03/17/22 1200 03/17/22 1211  BP: (!) 149/91 (!) 160/108  Pulse: (!) 55 62  Resp: (!) 8 20  Temp:  36.6 C  SpO2: 93% 98%    Last Pain:  Vitals:   03/17/22 1211  TempSrc: Temporal  PainSc: 0-No pain                 Arita Miss

## 2022-03-17 NOTE — Anesthesia Procedure Notes (Signed)

## 2022-03-17 NOTE — Op Note (Signed)
Procedure Date:  03/17/2022  Pre-operative Diagnosis:  Reducible Umbilical hernia, ventral hernia, and diastasis recti  Post-operative Diagnosis: Reducible Umbilical hernia, ventral hernia, and diastasis recti.  Procedure:  Robotic assisted Umbilical and Ventral Hernia Repair and plication of diastasis recti with mesh  Surgeon:  Melvyn Neth, MD  Anesthesia:  General endotracheal  Estimated Blood Loss:  10 ml  Specimens:  None  Complications:  None  Findings:  The patient had two hernias, total defect length of 5 cm, both reducible.  He also had diastasis recti of the upper abdomen, with separation of about 3 cm.  This was plicated at the same time as closure of the hernias, for a total length of repair/plication of 15 cm.  Indications for Procedure:  This is a 71 y.o. male who presents with a reducible umbilical and ventral hernia, in addition to diastasis recti..  The options of surgery versus observation were reviewed with the patient and/or family. The risks of bleeding, abscess or infection, recurrence of symptoms, potential for an open procedure, injury to surrounding structures, and chronic pain were all discussed with the patient and was willing to proceed.  Description of Procedure: The patient was correctly identified in the preoperative area and brought into the operating room.  The patient was placed supine with VTE prophylaxis in place.  Appropriate time-outs were performed.  Anesthesia was induced and the patient was intubated.  Appropriate antibiotics were infused.  The abdomen was prepped and draped in a sterile fashion. The patient's hernia defects were marked with a marking pen.  A Veress needle was introduced in the left upper quadrant and pneumoperitoneum was obtained with appropriate pressures.  Using Optiview technique, an 8 mm port was introduced in the left lateral abdominal wall without complications.  Then, a 12 mm port was introduced in the left upper  quadrant and an 8 mm port in the left lower quadrant under direct visualization.  The DaVinci platform was docked, camera targeted, and instruments placed under direct visualization.  The patient was confirmed to have an umbilical hernia and a ventral hernia.  Both contained preperitoneal fat, but the omentum was scarred onto the ventral defect.  The patient's hernia was fully reduced and the peritoneum and preperitoneal fat were dissected and resected to allow better exposure of the hernia defect and for better mesh placement.  The defects were measured to be about 5 cm total.  A 4x6 inch Bard Ventralight ST Echo mesh, a 0 Stratafix suture, and three 2-0 V-loc sutures were inserted through the 12 mm port under direct visualization.  The hernia defect was closed using the stratafix suture while plicating the diastasis recti in the upper abdomen.  A PMI was brought through the center of the hernia defect and the positioning system of the mesh was passed through.  This allowed the mesh to splay open and be centered over the repair site with good overlap.  The mesh was then sutured in place circumferentially and through the center of the mesh using the V-loc sutures.  All needles and the positioning system were then removed through the 12 mm port without complications.  The DaVinci platform was then undocked and instruments removed.    60 ml of Exparel solution mixed with 0.5% bupivacaine with epi was infiltrated around the mesh edges, hernia repair site, and port sites.  The 12 mm port was removed and the fascia was closed under direct visualization utilizing an Endo Close technique with 0 Vicryl suture.  The 8  mm ports were removed. All ports were closed using 3-0 Vicryl and 4-0 Monocryl.  The wounds were cleaned and sealed with DermaBond.  The patient was emerged from anesthesia and extubated and brought to the recovery room for further management.  The patient tolerated the procedure well and all counts  were correct at the end of the case.   Melvyn Neth, MD

## 2022-03-17 NOTE — Anesthesia Preprocedure Evaluation (Signed)
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  Reviewed: Allergy & Precautions, H&P , NPO status , Patient's Chart, lab work & pertinent test results  History of Anesthesia Complications (+) Emergence Delirium and history of anesthetic complications  Airway Mallampati: II  TM Distance: >3 FB Neck ROM: full    Dental  (+) Partial Upper, Partial Lower   Pulmonary neg sleep apnea, neg COPD, Patient abstained from smoking.Not current smoker, former smoker,    Pulmonary exam normal breath sounds clear to auscultation       Cardiovascular Exercise Tolerance: Good METShypertension, + Peripheral Vascular Disease  (-) CAD and (-) Past MI (-) dysrhythmias  Rhythm:regular Rate:Normal - Systolic murmurs    Neuro/Psych PSYCHIATRIC DISORDERS Depression Dementia Parkinson diseasenegative neurological ROS     GI/Hepatic GERD  ,(+)     (-) substance abuse  ,   Endo/Other  neg diabetes  Renal/GU negative Renal ROS     Musculoskeletal  (+) Arthritis ,   Abdominal   Peds  Hematology   Anesthesia Other Findings Past Medical History: No date: Allergy     Comment:  seasonal No date: Arthritis No date: BPH (benign prostatic hyperplasia) No date: Complication of anesthesia     Comment:  Pt. stated he had a reaction that ended in him requiring              urinary cath placement; hx delirium worsening parkinson               symptoms No date: Diverticulosis No date: GERD (gastroesophageal reflux disease)     Comment:  esophageal spasms No date: Glaucoma No date: Gout No date: Head injury, closed, with concussion No date: Hepatitis C     Comment:  chronic - Has been treated with Harvoni No date: HLD (hyperlipidemia)     Comment:  statin intolerant (Crestor & Simvastatin) - Taking                Livalo '1mg'$  / week No date: Hypertension No date: Parkinson's disease ([place]) No date: Plantar fasciitis     Comment:  right No date: PVD (peripheral vascular disease) ([place])     Comment:  With no claudication; only mild abdominal aortic               atherosclerosis noted on ultrasound. No date: Spinal stenosis of lumbar region No date: Thoracic ascending aortic aneurysm (HCC)     Comment:  4.2 cm ascending TAA 09/2016 CT, 1 yr f/u rec No date: Ulcer  Reproductive/Obstetrics                             Anesthesia Physical  Anesthesia Plan  ASA: 2  Anesthesia Plan: General   Post-op Pain Management: Tylenol PO (pre-op)* and Celebrex PO (pre-op)*   Induction: Intravenous  PONV Risk Score and Plan: 2 and 4 or greater and Ondansetron, Dexamethasone and Treatment may vary due to age or medical condition  Airway Management Planned: Oral ETT  Additional Equipment: None  Intra-op Plan:   Post-operative Plan: Extubation in OR  Informed Consent: I have reviewed the patients History and Physical, chart, labs and discussed the procedure including the risks, benefits and alternatives for the proposed anesthesia with the patient or authorized representative who has indicated his/her understanding and acceptance.     Dental advisory given  Plan Discussed with: CRNA, Surgeon and Anesthesiologist  Anesthesia Plan Comments: (Discussed risks of anesthesia with patient and wife at bedside, including PONV, sore throat, lip/dental/eye damage, post operative cognitive dysfunction. Rare risks discussed as well, such as cardiorespiratory and neurological sequelae, and allergic reactions. Discussed the role of CRNA in patient's perioperative care. Patient understands. Both are concerned about risk of delirium and POCD. I discussed that there have not been clear differences of POCD incidence between anesthesia types. I assured that we would do our best to avoid polypharmacy.  They understand.)        Anesthesia Quick Evaluation

## 2022-03-17 NOTE — Discharge Instructions (Signed)
AMBULATORY SURGERY  ?DISCHARGE INSTRUCTIONS ? ? ?The drugs that you were given will stay in your system until tomorrow so for the next 24 hours you should not: ? ?Drive an automobile ?Make any legal decisions ?Drink any alcoholic beverage ? ? ?You may resume regular meals tomorrow.  Today it is better to start with liquids and gradually work up to solid foods. ? ?You may eat anything you prefer, but it is better to start with liquids, then soup and crackers, and gradually work up to solid foods. ? ? ?Please notify your doctor immediately if you have any unusual bleeding, trouble breathing, redness and pain at the surgery site, drainage, fever, or pain not relieved by medication. ? ? ? ?Additional Instructions: ? ? ? ?Please contact your physician with any problems or Same Day Surgery at [phone], Monday through Friday 6 am to 4 pm, or [place] [facility] [place] Main number at [phone].  ?

## 2022-03-17 NOTE — Transfer of Care (Signed)
Immediate Anesthesia Transfer of Care Note  Patient: [name]  Procedure(s) Performed: XI ROBOTIC ASSISTED VENTRAL HERNIA  Patient Location: PACU  Anesthesia Type:General  Level of Consciousness: awake, drowsy and patient cooperative  Airway & Oxygen Therapy: Patient Spontanous Breathing and Patient connected to face mask oxygen  Post-op Assessment: Report given to RN and Post -op Vital signs reviewed and stable  Post vital signs: Reviewed and stable  Last Vitals:  Vitals Value Taken Time  BP 169/100 03/17/22 1045  Temp 36.2 C 03/17/22 1042  Pulse 55 03/17/22 1049  Resp 11 03/17/22 1049  SpO2 98 % 03/17/22 1049  Vitals shown include unvalidated device data.  Last Pain:  Vitals:   03/17/22 1042  TempSrc:   PainSc: Asleep         Complications: No notable events documented.

## 2022-03-17 NOTE — H&P (Signed)
Date of Admission:  03/17/2022  Reason for Admission:  Ventral hernias and diastasis recti  History of Present Illness: [name] is a 71 y.o. male presents for surgery today for ventral hernias and diastasis recti.  He was initially scheduled for 02/26/22 but case had to be postponed as the patient had gotten sick.  He tested negative for COVID-19 and he's now recovered.  He has been cleared for surgery.  No new issues with the hernias.  Past Medical History: Past Medical History:  Diagnosis Date   Allergy    seasonal   Arthritis    BPH (benign prostatic hyperplasia)    Complication of anesthesia    Pt. stated he had a reaction that ended in him requiring urinary cath placement; hx delirium worsening parkinson symptoms   Diverticulosis    GERD (gastroesophageal reflux disease)    esophageal spasms   Glaucoma    Gout    Head injury, closed, with concussion    Hepatitis C    chronic - Has been treated with Harvoni   HLD (hyperlipidemia)    statin intolerant (Crestor & Simvastatin) - Taking Livalo '1mg'$  / week   Hypertension    Parkinson's disease ([place])    Plantar fasciitis    right   PVD (peripheral vascular disease) ([place])    With no claudication; only mild abdominal aortic atherosclerosis noted on ultrasound.   Spinal stenosis of lumbar region    Thoracic ascending aortic aneurysm (HCC)    4.2 cm ascending TAA 09/2016 CT, 1 yr f/u rec   Ulcer      Past Surgical History: Past Surgical History:  Procedure Laterality Date   BUNIONECTOMY WITH WEIL OSTEOTOMY Right 11/02/2019   Procedure: Right Foot Lapidus, Modified McBride Bunionectomy,  Hallux Akin Osteotomy;  Surgeon: Wylene Simmer, MD;  Location: [facility];  Service: Orthopedics;  Laterality: Right;   CARDIAC CATHETERIZATION  2005   30% Cx. Dr. Melvern Banker   cataract surgery Left 11/05/2014   COLONOSCOPY     COLONOSCOPY N/A 01/09/2021   Procedure: COLONOSCOPY;  Surgeon: Ronnette Juniper, MD;  Location: [facility]  END[facility]COPY;  Service: Gastroenterology;  Laterality: N/A;   ESOPHAGOGASTRODUODENOSCOPY N/A 05/02/2015   Procedure: ESOPHAGOGASTRODUODENOSCOPY (EGD);  Surgeon: Ronald Lobo, MD;  Location: [facility];  Service: Endoscopy;  Laterality: N/A;   ESOPHAGOGASTRODUODENOSCOPY  05/02/2015   no source of pt chest pain endoscopically evident. small hiatal hernia.   ESOPHAGOGASTRODUODENOSCOPY (EGD) WITH PROPOFOL N/A 01/02/2021   Procedure: ESOPHAGOGASTRODUODENOSCOPY (EGD) WITH PROPOFOL;  Surgeon: Clarene Essex, MD;  Location: [facility];  Service: Endoscopy;  Laterality: N/A;   EYE SURGERY     HARDWARE REMOVAL Right 11/02/2019   Procedure: Second Metatarsal Removal of Deep Implant and Rotational Osteotomy;  Surgeon: Wylene Simmer, MD;  Location: [facility];  Service: Orthopedics;  Laterality: Right;   IR ANGIOGRAM SELECTIVE EACH ADDITIONAL VESSEL  01/04/2021   IR ANGIOGRAM SELECTIVE EACH ADDITIONAL VESSEL  01/04/2021   IR ANGIOGRAM VISCERAL SELECTIVE  01/04/2021   IR US GUIDE VASC ACCESS RIGHT  01/04/2021   LUMBAR LAMINECTOMY/DECOMPRESSION MICRODISCECTOMY N/A 07/03/2021   Procedure: Lumbar three through five decompression with lumbar three through five insitu fusion;  Surgeon: Melina Schools, MD;  Location: [facility];  Service: Orthopedics;  Laterality: N/A;   MEMBRANE PEEL Left 03/14/2014   Procedure: MEMBRANE PEEL; ENDOLASER;  Surgeon: Hurman Horn, MD;  Location: [facility];  Service: Ophthalmology;  Laterality: Left;   NM MYOVIEW LTD  10/2015   LOW RISK. Small, fixed  basal lateral defect - likely diaphragmatic attenuation. 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  No R WMA.  GRII DD-moderately dilated LA..  Mildly dilated RV but  normal function.  Normal RAP/CVP.  Trivial AI with mild to moderate sclerosis-no stenosis   UPPER GASTROINTESTINAL ENDOSCOPY     WEIL OSTEOTOMY Right 09/01/2017   Procedure: RIGHT GREAT TOE CHEVRON AND WEIL OSTEOTOMY 2ND METATARSAL;  Surgeon: Newt Minion, MD;  Location: [facility];  Service: Orthopedics;  Laterality: Right;    Home Medications: Prior to Admission medications   Medication Sig Start Date End Date Taking? Authorizing Provider  alfuzosin (UROXATRAL) 10 MG 24 hr tablet Take 10 mg by mouth daily.   Yes [provider]  carbidopa-levodopa (SINEMET IR) 25-100 MG tablet TAKE 1 AND 1/2 TABLETS BY MOUTH THREE TIMES DAILY 10/08/21 10/08/22 Yes McCue, Janett Billow, NP  citalopram (CELEXA) 20 MG tablet TAKE ONE TABLET BY MOUTH EVERYDAY AT BEDTIME 12/01/21  Yes Denita Lung, MD  donepezil (ARICEPT) 10 MG tablet Take 1 tablet by mouth at bedtime. 11/05/21  Yes McCue, Janett Billow, NP  dorzolamide-timolol (COSOPT) 22.3-6.8 MG/ML ophthalmic solution Instill 1 drop into both eyes twice a day 10/08/21  Yes   FIBER ADULT GUMMIES PO Take 1 tablet by mouth 2 (two) times daily.   Yes [provider]  irbesartan (AVAPRO) 75 MG tablet TAKE ONE TABLET BY MOUTH EVERY MORNING 12/30/21  Yes Denita Lung, MD  latanoprost (XALATAN) 0.005 % ophthalmic solution Place 1 drop into the left eye daily. 06/23/21 06/23/22 Yes Rankin, Clent Demark, MD  Multiple Vitamins-Minerals (MULTIVITAMIN ADULTS 50+ PO) Take 1 tablet by mouth daily.   Yes [provider]  Pitavastatin Calcium 2 MG TABS Take every other day 11/04/21  Yes Denita Lung, MD  trihexyphenidyl (ARTANE) 2 MG tablet Take 1/2 tablet (1 mg total) by mouth 2 (two) times daily with a meal. 10/08/21  Yes McCue, Janett Billow, NP  verapamil (CALAN-SR) 120 MG CR tablet TAKE ONE TABLET BY MOUTH EVERY MORNING 12/30/21  Yes Denita Lung, MD  nitroGLYCERIN (NITROSTAT) 0.4 MG SL tablet Place 1 tablet (0.4 mg total) under the tongue every 5 (five) minutes as needed  for chest pain. Patient not taking: Reported on 03/17/2022 10/10/21   [name], MD  pantoprazole ([place]) 40 MG tablet TAKE ONE TABLET BY MOUTH EVERY MORNING and TAKE ONE TABLET BY MOUTH EVERY EVENING Patient not taking: Reported on 03/17/2022 03/05/22   Denita Lung, MD  timolol (TIMOPTIC) 0.5 % ophthalmic solution Instill 1 drop into both eyes every morning Patient not taking: Reported on 02/17/2022 07/24/21       Allergies: Allergies  Allergen Reactions   Anesthesia S-I-40 [Propofol] Other (See Comments)   Other Other (See Comments)    Severe delirium with anesthesia per spouse enhances parkinsons symptoms    Social History:  reports that he quit smoking about 33 years ago. His smoking use included cigars. He has never used smokeless tobacco. He reports current alcohol use of about 3.0 standard drinks of alcohol per week. He reports that he does not use drugs.   Family History: Family History  Problem Relation Age of Onset  Heart disease Mother    Cancer Paternal Grandfather    Cancer Sister     Review of Systems: Review of Systems  Constitutional:  Negative for chills and fever.  Respiratory:  Negative for shortness of breath.   Cardiovascular:  Negative for chest pain.  Gastrointestinal:  Negative for abdominal pain, nausea and vomiting.  Skin:  Negative for rash.    Physical Exam BP 128/88   Pulse (!) 52   Temp (!) 97.5 F (36.4 C) (Temporal)   Resp 18   Ht '5\' 10"'$  (1.778 m)   Wt 72.6 kg   SpO2 99%   BMI 22.96 kg/m  CONSTITUTIONAL: No acute distress HEENT:  Normocephalic, atraumatic, extraocular motion intact. NECK: Trachea is midline, and there is no jugular venous distension.  RESPIRATORY:  Lungs are clear, and breath sounds are equal bilaterally. Normal respiratory effort without pathologic use of accessory muscles. CARDIOVASCULAR: Heart is regular without murmurs, gallops, or rubs. GI: The abdomen is soft, non-distended, non-tender.  Patient has  umbilical and ventral hernias as before, with diastasis recti.  Stable exam.  MUSCULOSKELETAL:  Normal muscle strength and tone in all four extremities.  No peripheral edema or cyanosis. SKIN: Skin turgor is normal. There are no pathologic skin lesions.  NEUROLOGIC:  Motor and sensation is grossly normal.  Cranial nerves are grossly intact. PSYCH:  Alert and oriented to person, place and time. Affect is normal.  Laboratory Analysis: Results for orders placed or performed during the hospital encounter of 03/17/22 (from the past 24 hour(s))  CBC per protocol     Status: Abnormal   Collection Time: 03/17/22  6:37 AM  Result Value Ref Range   WBC 5.1 4.0 - 10.5 K/uL   RBC 4.18 (L) 4.22 - 5.81 MIL/uL   Hemoglobin 11.8 (L) 13.0 - 17.0 g/dL   HCT 36.4 (L) 39.0 - 52.0 %   MCV 87.1 80.0 - 100.0 fL   MCH 28.2 26.0 - 34.0 pg   MCHC 32.4 30.0 - 36.0 g/dL   RDW 13.0 11.5 - 15.5 %   Platelets 218 150 - 400 K/uL   nRBC 0.0 0.0 - 0.2 %    Imaging: No results found.  Assessment and Plan: This is a 71 y.o. male with ventral hernias and diastasis.  --Discussed plan with patient again for robotic assisted ventral hernias repair and plication of diastasis recti.  Discussed the use of mesh to reinforce the repair and abdominal binder post-op.  All questions answered.   Melvyn Neth, MD [facility] Pg:  [phone]

## 2022-03-18 ENCOUNTER — Encounter: Payer: Self-pay | Admitting: Surgery

## 2022-03-29 ENCOUNTER — Other Ambulatory Visit: Payer: Self-pay | Admitting: Adult Health

## 2022-03-30 ENCOUNTER — Other Ambulatory Visit: Payer: Self-pay | Admitting: Family Medicine

## 2022-03-31 ENCOUNTER — Telehealth: Payer: Self-pay

## 2022-03-31 NOTE — Patient Outreach (Signed)
  Care Coordination   03/31/2022 Name: [name] MRN: [id number] DOB: [birth year]   Care Coordination Outreach Attempts:  A third unsuccessful outreach was attempted today to offer the patient with information about available care coordination services as a benefit of their health plan.   Follow Up Plan:  No further outreach attempts will be made at this time. We have been unable to contact the patient to offer or enroll patient in care coordination services  Encounter Outcome:  No Answer  Care Coordination Interventions Activated:  No   Care Coordination Interventions:  No, not indicated    Daneen Schick, BSW, CDP Social Worker, Certified Dementia Practitioner Care Coordination [phone]

## 2022-04-01 ENCOUNTER — Encounter: Payer: Medicare Other | Admitting: Surgery

## 2022-04-01 ENCOUNTER — Telehealth: Payer: Self-pay | Admitting: Adult Health

## 2022-04-01 ENCOUNTER — Encounter: Payer: Self-pay | Admitting: Internal Medicine

## 2022-04-01 ENCOUNTER — Telehealth: Payer: Self-pay | Admitting: Pharmacist

## 2022-04-01 DIAGNOSIS — R251 Tremor, unspecified: Secondary | ICD-10-CM

## 2022-04-01 DIAGNOSIS — G3184 Mild cognitive impairment, so stated: Secondary | ICD-10-CM

## 2022-04-01 MED ORDER — TRIHEXYPHENIDYL HCL 2 MG PO TABS: 1.0000 mg | ORAL_TABLET | Freq: Two times a day (BID) | ORAL | 5 refills | Status: DC

## 2022-04-01 MED ORDER — DONEPEZIL HCL 10 MG PO TABS: 10.0000 mg | ORAL_TABLET | Freq: Every day | ORAL | 5 refills | Status: DC

## 2022-04-01 NOTE — Telephone Encounter (Signed)
[name] requesting [facility]ill on donepezil (ARICEPT) 10 MG tablet and trihexyphenidyl (ARTANE) 2 MG tablet. [name] is requesting refills be sent to [facility]. [name] was requesting 30 day supply on both refills.

## 2022-04-01 NOTE — Telephone Encounter (Signed)
Refills sent as requested. Patient has FU.

## 2022-04-01 NOTE — Chronic Care Management (AMB) (Signed)
Chronic Care Management Pharmacy Assistant   Name: [name]  MRN: [id number] DOB: Sep 12, 1950  Reason for Encounter: Medication Review Medication Coordination   Recent office visits:  03/06/22 Jesus Lung, MD - Patient presented via video for Viral URI with cough. No medication changes.  02/03/22 Jesus Lung, MD - Patient presented for Mass of [name] and other concerns. Injection Administered. No medication changes. Recommended Heat and Aleve after procedure.  Recent consult visits:  03/09/22 Jesus Ree, MD - Patient presented to [facility] for Pre- Admission Testing.  Hospital visits:  Medication Reconciliation was completed by comparing discharge summary, patient's EMR and Pharmacy list, and upon discussion with patient.  Patient presented to [facility] on 03/17/22 due to Ventral hernia without obstruction or gangrene. Patient was present for 6 hours  New?Medications Started at [facility] Discharge:?? -started  acetaminophen (TYLENOL) ibuprofen (ADVIL) oxyCODONE (Oxy IR/ROXICODONE)  Medication Changes at Hospital Discharge: -Changed  none  Medications Discontinued at Hospital Discharge: -Stopped  none  Medications that remain the same after Hospital Discharge:??  -All other medications will remain the same.    Medications: Outpatient Encounter Medications as of 04/01/2022  Medication Sig   acetaminophen (TYLENOL) 500 MG tablet Take 2 tablets (1,000 mg total) by mouth every 6 (six) hours as needed for mild pain.   alfuzosin (UROXATRAL) 10 MG 24 hr tablet Take 10 mg by mouth daily.   carbidopa-levodopa (SINEMET IR) 25-100 MG tablet TAKE 1 AND 1/2 TABLETS BY MOUTH THREE TIMES DAILY   citalopram (CELEXA) 20 MG tablet TAKE ONE TABLET BY MOUTH EVERYDAY AT BEDTIME   donepezil (ARICEPT) 10 MG tablet Take 1 tablet by mouth at bedtime.   dorzolamide-timolol (COSOPT) 22.3-6.8 MG/ML ophthalmic solution Instill 1 drop into both  eyes twice a day   FIBER ADULT GUMMIES PO Take 1 tablet by mouth 2 (two) times daily.   ibuprofen (ADVIL) 600 MG tablet Take 1 tablet (600 mg total) by mouth every 8 (eight) hours as needed for moderate pain.   irbesartan (AVAPRO) 75 MG tablet TAKE ONE TABLET BY MOUTH EVERY MORNING   latanoprost (XALATAN) 0.005 % ophthalmic solution Place 1 drop into the left eye daily.   Multiple Vitamins-Minerals (MULTIVITAMIN ADULTS 50+ PO) Take 1 tablet by mouth daily.   nitroGLYCERIN (NITROSTAT) 0.4 MG SL tablet Place 1 tablet (0.4 mg total) under the tongue every 5 (five) minutes as needed for chest pain. (Patient not taking: Reported on 03/17/2022)   oxyCODONE (OXY IR/ROXICODONE) 5 MG immediate release tablet Take 1 tablet (5 mg total) by mouth every 4 (four) hours as needed for severe pain.   pantoprazole (PROTONIX) 40 MG tablet TAKE ONE TABLET BY MOUTH EVERY MORNING and TAKE ONE TABLET BY MOUTH EVERY EVENING (Patient not taking: Reported on 03/17/2022)   Pitavastatin Calcium 2 MG TABS Take every other day   timolol (TIMOPTIC) 0.5 % ophthalmic solution Instill 1 drop into both eyes every morning (Patient not taking: Reported on 02/17/2022)   trihexyphenidyl (ARTANE) 2 MG tablet Take 1/2 tablet (1 mg total) by mouth 2 (two) times daily with a meal.   verapamil (CALAN-SR) 120 MG CR tablet TAKE ONE TABLET BY MOUTH EVERY MORNING   No facility-administered encounter medications on file as of 04/01/2022.   Reviewed chart for medication changes ahead of medication coordination call.  BP Readings from Last 3 Encounters:  03/17/22 (!) 160/108  02/03/22 116/70  01/21/22 (!) 142/88    No results found for: "HGBA1C"  Patient obtains medications through Adherence Packaging  30 Days   Last adherence delivery included:  Alfuzosin 10 mg : Take one at Bedtime Verapamil (Calan-SR) 120 mg: Take one tab at breakfast Citalopram (Celexa) 20 mg: Take one tab at bedtime Trihexyphenidyl (Artane) 2 mg: Take half tab at  breakfast and half at dinner Carbidopa - Levodopa (Sinemet) 25-100 mg: Take one and a half tabs before breakfast, lunch and dinner Pantoprazole (Protonix) 40 mg: Take one tab at breakfast and dinner Irbesartan (Avapro) 75 mg: Take one tab at breakfast Pitavastatin Calcium (Livalo) 2 mg: Take one tab every other day at breakfast Donepezil (Aricept) 10 mg: Take one tab at bedtime Dorzolamide - timolol : One drop in each eye 2x daily [name] Drops Due 03/11/22     Patient is due for next adherence delivery on: 04/10/22. Called patient and reviewed medications and coordinated delivery. Packs 30 DS  This delivery to include: Alfuzosin 10 Mg: Take one at Bedtime Verapamil (Calan-SR) 120 mg: Take one tab at breakfast Citalopram (Celexa) 20 mg: Take one tab at bedtime Trihexyphenidyl (Artane) 2 mg: Take half tab at breakfast and half at dinner Carbidopa - Levodopa (Sinemet) 25-100 mg: Take one and a half tabs before breakfast, lunch and dinner Pantoprazole (Protonix) 40 mg: Take one tab at breakfast and dinner Irbesartan (Avapro) 75 mg: Take one tab at breakfast Pitavastatin Calcium (Livalo) 2 mg: Take one tab every other day at breakfast Donepezil (Aricept) 10 mg: Take one tab at bedtime Dorzolamide - timolol : One drop in each eye 2x daily [name] Drops : One drop in each eye at bedtime  Call to Neuro to request prescription with refills for Aricept and Artane [facility]   Confirmed delivery date of 04/10/22, advised patient that pharmacy will contact them the morning of delivery.    Care Gaps: COVID Booster - Overdue Flu Vaccine - Overdue AWV- 3/23   Star Rating Drugs: Irbesartan (Avapro) 75 mg - Last filled 01/06/22 38  DS at [facility] Pitavastatin 2 mg - Last filled 01/06/22 38 DS at [facility]   Patient Assistance: [name] for [place] Pharmacist Assistant [phone]

## 2022-04-06 ENCOUNTER — Other Ambulatory Visit (INDEPENDENT_AMBULATORY_CARE_PROVIDER_SITE_OTHER): Payer: Medicare Other

## 2022-04-06 DIAGNOSIS — Z23 Encounter for immunization: Secondary | ICD-10-CM

## 2022-04-08 ENCOUNTER — Other Ambulatory Visit: Payer: Self-pay

## 2022-04-08 ENCOUNTER — Encounter: Payer: Self-pay | Admitting: Surgery

## 2022-04-08 ENCOUNTER — Ambulatory Visit (INDEPENDENT_AMBULATORY_CARE_PROVIDER_SITE_OTHER): Payer: Medicare Other | Admitting: Surgery

## 2022-04-08 VITALS — BP 164/96 | HR 57 | Temp 97.9°F | Ht 70.0 in | Wt 154.0 lb

## 2022-04-08 DIAGNOSIS — K439 Ventral hernia without obstruction or gangrene: Secondary | ICD-10-CM | POA: Diagnosis not present

## 2022-04-08 DIAGNOSIS — Z09 Encounter for follow-up examination after completed treatment for conditions other than malignant neoplasm: Secondary | ICD-10-CM | POA: Diagnosis not present

## 2022-04-08 NOTE — Progress Notes (Signed)
04/08/2022  History of Present Illness: [name] is a 71 y.o. male status post robotic assisted umbilical and ventral hernia repair with plication of diastases recti on 03/17/2022.  Patient presents today for follow-up.  He reports that he has been doing very well and denies any abdominal pain.  He is tolerating a diet with good bowel function and without any nausea or vomiting.  No problems with the incisions.  Past Medical History: Past Medical History:  Diagnosis Date   Allergy    seasonal   Arthritis    BPH (benign prostatic hyperplasia)    Complication of anesthesia    Pt. stated he had a reaction that ended in him requiring urinary cath placement; hx delirium worsening parkinson symptoms   Diverticulosis    GERD (gastroesophageal reflux disease)    esophageal spasms   Glaucoma    Gout    Head injury, closed, with concussion    Hepatitis C    chronic - Has been treated with Harvoni   HLD (hyperlipidemia)    statin intolerant (Crestor & Simvastatin) - Taking Livalo '1mg'$  / week   Hypertension    Parkinson's disease ([place])    Plantar fasciitis    right   PVD (peripheral vascular disease) ([place])    With no claudication; only mild abdominal aortic atherosclerosis noted on ultrasound.   Spinal stenosis of lumbar region    Thoracic ascending aortic aneurysm (HCC)    4.2 cm ascending TAA 09/2016 CT, 1 yr f/u rec   Ulcer      Past Surgical History: Past Surgical History:  Procedure Laterality Date   BUNIONECTOMY WITH WEIL OSTEOTOMY Right 11/02/2019   Procedure: Right Foot Lapidus, Modified McBride Bunionectomy,  Hallux Akin Osteotomy;  Surgeon: Wylene Simmer, MD;  Location: [place];  Service: Orthopedics;  Laterality: Right;   CARDIAC CATHETERIZATION  2005   30% Cx. Dr. Melvern Banker   cataract surgery Left 11/05/2014   COLONOSCOPY     COLONOSCOPY N/A 01/09/2021   Procedure: COLONOSCOPY;  Surgeon: Ronnette Juniper, MD;  Location: [facility];  Service:  Gastroenterology;  Laterality: N/A;   ESOPHAGOGASTRODUODENOSCOPY N/A 05/02/2015   Procedure: ESOPHAGOGASTRODUODENOSCOPY (EGD);  Surgeon: Ronald Lobo, MD;  Location: [facility];  Service: Endoscopy;  Laterality: N/A;   ESOPHAGOGASTRODUODENOSCOPY  05/02/2015   no source of pt chest pain endoscopically evident. small hiatal hernia.   ESOPHAGOGASTRODUODENOSCOPY (EGD) WITH PROPOFOL N/A 01/02/2021   Procedure: ESOPHAGOGASTRODUODENOSCOPY (EGD) WITH PROPOFOL;  Surgeon: Clarene Essex, MD;  Location: [facility];  Service: Endoscopy;  Laterality: N/A;   EYE SURGERY     HARDWARE REMOVAL Right 11/02/2019   Procedure: Second Metatarsal Removal of Deep Implant and Rotational Osteotomy;  Surgeon: Wylene Simmer, MD;  Location: [place];  Service: Orthopedics;  Laterality: Right;   IR ANGIOGRAM SELECTIVE EACH ADDITIONAL VESSEL  01/04/2021   IR ANGIOGRAM SELECTIVE EACH ADDITIONAL VESSEL  01/04/2021   IR ANGIOGRAM VISCERAL SELECTIVE  01/04/2021   IR US GUIDE VASC ACCESS RIGHT  01/04/2021   LUMBAR LAMINECTOMY/DECOMPRESSION MICRODISCECTOMY N/A 07/03/2021   Procedure: Lumbar three through five decompression with lumbar three through five insitu fusion;  Surgeon: Melina Schools, MD;  Location: [place];  Service: Orthopedics;  Laterality: N/A;   MEMBRANE PEEL Left 03/14/2014   Procedure: MEMBRANE PEEL; ENDOLASER;  Surgeon: Hurman Horn, MD;  Location: [place];  Service: Ophthalmology;  Laterality: Left;   NM MYOVIEW LTD  10/2015   LOW RISK. Small, fixed basal lateral defect - likely diaphragmatic attenuation. 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  No R WMA.  GRII DD-moderately dilated LA..  Mildly dilated RV but normal function.   Normal RAP/CVP.  Trivial AI with mild to moderate sclerosis-no stenosis   UPPER GASTROINTESTINAL ENDOSCOPY     WEIL OSTEOTOMY Right 09/01/2017   Procedure: RIGHT GREAT TOE CHEVRON AND WEIL OSTEOTOMY 2ND METATARSAL;  Surgeon: Newt Minion, MD;  Location: [place];  Service: Orthopedics;  Laterality: Right;   XI ROBOTIC ASSISTED VENTRAL HERNIA N/A 03/17/2022   Procedure: XI ROBOTIC ASSISTED VENTRAL HERNIA;  Surgeon: [name], MD;  Location: [facility];  Service: General;  Laterality: N/A;    Home Medications: Prior to Admission medications   Medication Sig Start Date End Date Taking? Authorizing Provider  acetaminophen (TYLENOL) 500 MG tablet Take 2 tablets (1,000 mg total) by mouth every 6 (six) hours as needed for mild pain. 03/17/22  Yes Vallorie Niccoli, Jacqulyn Bath, MD  alfuzosin (UROXATRAL) 10 MG 24 hr tablet Take 10 mg by mouth daily.   Yes [provider]  carbidopa-levodopa (SINEMET IR) 25-100 MG tablet TAKE 1 AND 1/2 TABLETS BY MOUTH THREE TIMES DAILY 10/08/21 10/08/22 Yes McCue, Janett Billow, NP  citalopram (CELEXA) 20 MG tablet TAKE ONE TABLET BY MOUTH EVERYDAY AT BEDTIME 12/01/21  Yes Denita Lung, MD  donepezil (ARICEPT) 10 MG tablet Take 1 tablet by mouth at bedtime. 04/01/22  Yes McCue, Janett Billow, NP  dorzolamide-timolol (COSOPT) 22.3-6.8 MG/ML ophthalmic solution Instill 1 drop into both eyes twice a day 10/08/21  Yes   FIBER ADULT GUMMIES PO Take 1 tablet by mouth 2 (two) times daily.   Yes [provider]  ibuprofen (ADVIL) 600 MG tablet Take 1 tablet (600 mg total) by mouth every 8 (eight) hours as needed for moderate pain. 03/17/22  Yes Orphia Mctigue, Jacqulyn Bath, MD  irbesartan (AVAPRO) 75 MG tablet TAKE ONE TABLET BY MOUTH EVERY MORNING 03/31/22  Yes Denita Lung, MD  latanoprost (XALATAN) 0.005 % ophthalmic solution Place 1 drop into the left eye daily. 06/23/21 06/23/22 Yes Rankin, Clent Demark, MD  Multiple Vitamins-Minerals (MULTIVITAMIN ADULTS 50+ PO) Take 1 tablet by mouth daily.   Yes [provider]  nitroGLYCERIN (NITROSTAT) 0.4 MG SL tablet Place 1 tablet (0.4 mg total) under the tongue every 5 (five) minutes as needed for chest pain. 10/10/21  Yes [name], MD  pantoprazole (PROTONIX) 40 MG tablet TAKE ONE TABLET BY MOUTH EVERY MORNING and TAKE ONE TABLET BY MOUTH EVERY EVENING 03/05/22  Yes Denita Lung, MD  Pitavastatin Calcium 2 MG TABS Take every other day 11/04/21  Yes Denita Lung, MD  timolol (TIMOPTIC) 0.5 % ophthalmic solution Instill 1 drop into both eyes every morning 07/24/21  Yes   trihexyphenidyl (ARTANE) 2 MG tablet Take 1/2 tablet (1 mg total) by mouth 2 (two) times daily with a meal. 04/01/22  Yes McCue, Janett Billow, NP  verapamil (CALAN-SR) 120 MG CR tablet TAKE ONE TABLET BY MOUTH EVERY MORNING 03/31/22  Yes Denita Lung, MD    Allergies: Allergies  Allergen Reactions   Anesthesia S-I-40 [Propofol] Other (See Comments)   Other Other (See Comments)    Severe delirium with anesthesia per spouse enhances  parkinsons symptoms    Review of Systems: Review of Systems  Constitutional:  Negative for chills and fever.  Respiratory:  Negative for shortness of breath.   Cardiovascular:  Negative for chest pain.  Gastrointestinal:  Negative for abdominal pain, nausea and vomiting.  Skin:  Negative for rash.    Physical Exam BP (!) 164/96   Pulse (!) 57   Temp 97.9 F (36.6 C) (Oral)   Ht '5\' 10"'$  (1.778 m)   Wt 154 lb (69.9 kg)   SpO2 98%   BMI 22.10 kg/m  CONSTITUTIONAL: No acute distress, well-nourished HEENT:  Normocephalic, atraumatic, extraocular motion intact. RESPIRATORY:  Normal respiratory effort without pathologic use of accessory muscles. CARDIOVASCULAR: Regular rhythm and rate. GI: The abdomen is soft, nondistended, nontender to palpation.  Patient's incisions are healing well and are clean, dry, intact.  There is no evidence of any hernia recurrence.  Abdominal binder in place.  MUSCULOSKELETAL: Normal gait, no peripheral  edema. NEUROLOGIC:  Motor and sensation is grossly normal.  Cranial nerves are grossly intact. PSYCH:  Alert and oriented to person, place and time. Affect is normal.  Assessment and Plan: This is a 71 y.o. male status post robotic assisted umbilical and ventral hernia repair with plication of diastases recti.  - Patient is healing well without any complications.  Denies any pain currently and all his incisions are healing well with no evidence of hernia recurrence.  Discussed with the patient postoperative activity restrictions and continue use of his abdominal binder until 4 weeks after surgery.  Then he may start ramping up his activity and will not have any further restrictions after 6 weeks from surgery.  Patient understands this. - Follow-up as needed.  I spent 20 minutes dedicated to the care of this patient on the date of this encounter to include pre-visit review of records, face-to-face time with the patient discussing diagnosis and management, and any post-visit coordination of care.   Melvyn Neth, [facility]

## 2022-04-08 NOTE — Patient Instructions (Signed)

## 2022-04-09 ENCOUNTER — Ambulatory Visit: Payer: Medicare Other | Admitting: Adult Health

## 2022-04-09 ENCOUNTER — Encounter: Payer: Self-pay | Admitting: Adult Health

## 2022-04-09 VITALS — BP 126/82 | HR 57 | Ht 70.0 in | Wt 155.4 lb

## 2022-04-09 DIAGNOSIS — G20C Parkinsonism, unspecified: Secondary | ICD-10-CM

## 2022-04-09 DIAGNOSIS — G20 Parkinson's disease: Secondary | ICD-10-CM

## 2022-04-09 DIAGNOSIS — G3184 Mild cognitive impairment, so stated: Secondary | ICD-10-CM

## 2022-04-09 MED ORDER — CARBIDOPA-LEVODOPA 25-100 MG PO TABS: 1.5000 | ORAL_TABLET | Freq: Three times a day (TID) | ORAL | 3 refills | Status: DC

## 2022-04-09 NOTE — Patient Instructions (Addendum)
Your Plan:  Please take Sinemet 1.5 tablets three times daily - if able to take three times daily consistently but still have issues with your walking, freezing, tremors, or slowness, we can consider increasing dose to 4 times per day - please keep me updated  Continue Artane half tab twice daily  Continue Aricept '10mg'$  nightly for memory  Recommend following up with your primary care doctor to discuss underlying anxiety which could be contributing some to your memory difficulties and difficulty with decision making      Follow up in 6 months or call earlier if needed      Thank you for coming to see [place] at [facility]. I hope we have been able to provide you high quality care today.  You may receive a patient satisfaction survey over the next few weeks. We would appreciate your feedback and comments so that we may continue to improve ourselves and the health of our patients.

## 2022-04-09 NOTE — Progress Notes (Signed)
[facility] PATIENT: [name] DOB: 1951/03/09   REASON FOR VISIT: Follow-up for tremor mild cognitive impairment, depression HISTORY FROM: Patient and wife   No chief complaint on file.    HISTORY OF PRESENT ILLNESS:HISTORY:63 year African American male who since last year and a half has noticed increasing tremors mainly in the left arm and leg and to a lesser degree in the right arm as well. He states the tremors were quite mild but became more pronounced after a minor accident while staying [place] the [place] in [place]. He fell down 12 flights of steps and hit a concrete slab and had a concussion. He and some minor bruises and knee injury which took several months to reck of her period CT scan of the head was done 2 days later which was unremarkable. Since then he has had some walking difficulty but this may be related to his knee pain. He says that his feet do at times gets stuck in his started walking with a stooped posture. He however does not describe typical festination. He has resting and intermittent action tremor claiming the left arm and leg the tremor does improve with activities. The tremor does not seem to interfere with most of his routine. He denies significant bradykinesia, and drooling of saliva or micrographia. He has been evaluated by neurologists Dr. Reginia Forts at [facility] but he is unable to tell me for the diagnosis was. He was not told that this may be Parkinson's and was not tried on dopaminergic medications. He did have an MRI scan and some lab work but I do not have those results for my review available today. Patient is here today for a second opinion as he feels his tremor is not getting better. He does admit to some light masonry hallucinations as well as restless sleep thrashing of his legs. He denies significant memory or cognitive difficulties. He has not noticed any particular effect of alcohol on his tremor. Is no  family history of tremors. Patient does have chronic hepatitis C and is planning to start treatment with dapsone. He has had no seizures, significant head injury with major loss of consciousness, stroke, TIAs or significant neurological problems.  Update 08/14/2014 : He returns for follow-up after last visit 3 months ago. He feels his tremors are about the same. He had tried reducing the dose of Sinemet but noticed that his tremors got worse and hence he went back up to the original dose which is 25/100 one tablet 3 times daily. He still feels occasionally confused and at times secondary to some cells but he admits that he worries a lot. He also admits to feeling depressed, getting tired easily and not having initiated. He has not been on any medications for depression. Patient denies significant drooling of saliva, bradykinesia, gait or balance problems. He feels his tremor is mild and does not interfere with his activities of daily living. I discussed alternative treatment options including addition of dopamine agonist, anticholinergic or even consideration for deep brain  stimulation since his tremor is predominantly unilateral however the patient does not want to consider more aggressive treatment options at the present time. UPDATE 12/26/14 [name], 71 year old black male returns for follow-up. He was last seen by Dr. [name] 08/14/2014. He feels his tremors are better since increasing his carbidopa levodopa to 1-1/2 tablets 3 times daily. He is tolerating it well without dizziness, sleepiness, nightmares or hallucinations A MRI Brain with and without Contrast completed on  07/29/2011 was normal.  The Mini-Mental status exam today is unchanged 29 out of 30. He did not follow-up with his primary care for  complaints of depression. He denies any significant drooling bradykinesia falls or balance problems. His tremor does not interfere with his activities of daily living. He returns for reevaluation Update  07/03/2015 : He returns for follow-up after last visit 6 months ago. He states his tremors continue to do well and he seems to have responded quite well to increased dose of Sinemet 25/100 1-1/2 tablet 3 times daily. Is tolerating it well without any dizziness, sleepiness, nightmares, hallucinations, nausea or diarrhea. He continues to have mild memory difficulties but his has not been on any treatment for depression and in fact has not seen his primary care physician for the same for quite some time. Update 08/11/2016 PS : He returns for follow-up after last visit year ago. He states his tremors as well as memory loss or more or less unchanged. Continues to have intermittent tremors in the left hand mainly. The tremors fluctuate he has good days and bad days. He is currently taking Sinemet 25/100 one and half tablet 3 times daily. He complains of mild fatigue and daytime sleepiness. He denies hallucinations, delusions, dizziness or upset stomach. He does not want to try higher dose because of fear of side effects. Continues to have short-term memory difficulties. He has not been quite compulsive about taking notes and using memory compensation strategies but plans to do so. He has no new complaints today. UPDATE 07/19/2018CM [name], 71 year old male returns for follow-up with tremors and mild cognitive impairment which he says is stable. He continues to have a resting tremor in the left hand which is intermittent. He continues to have good and bad days. He remains on Sinemet 25 100 (1 and half tablets) 3 times daily. He does little exercise he is not doing any memory compensation strategies at this time and was encouraged to do so. He denies any falls. He denies any hallucinations or increased confusion. He denies any freezing spells. He does complain of left hip pain He returns for reevaluation UPDATE 1/22/2019CM [name], 24 he denies 49-year-old male returns for follow-up with a history of Parkinson's  disease.  He continues to have mild resting tremor in the left hand which is intermittent.  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  He remains on Sinemet 3 times a day however he is taking his medication with a meal.  He complains of sometimes being lightheaded when he stands up too quickly.  He was encouraged to sit on the side of the bed and then get up slightly.  He was also encouraged to stay well-hydrated.  He has not been exercising.  He denies any hallucinations or increased confusion.  He has not had any freezing spells.  He returns for reevaluation 02/15/18 UPDATE:JV  Patient returns today for six-month follow-up.  He continues to take Sinemet 3 times daily with the first dose around 6 AM (1 hour prior to eating breakfast), second dose 11 AM (1 hour prior to eating lunch) and then third dose between 5:30 and 6 PM (eats dinner around 6:30-7 PM).  Patient denies any increase in symptoms between doses but does state if he is late on a dose, he will start having increased tremors in his left upper extremity and left lower extremity.  He continues to complain of short-term memory issue and is unable to state whether it is worsening but he  states he notices it more.  He did obtain a large book full avoid games but does this approximately 1 time per week.  Recommended to do these activities at least once daily.  Patient also has complaints of feeling as though he overanalyzes certain situations and feeling increased fatigue.  Patient does endorse increased stress recently but does feel somewhat situational.  Provided patient with stress relaxation techniques and advised him if this continues, to follow-up with PCP for possible medication management.  He denies any hallucinations or increased confusion.  He denies any freezing spells. UPDATE 1/29/2020CM [name], 71 year old male returns for follow-up with a history of Parkinson's disease and mild cognitive impairment.  He denies any freezing spells any  stiffness any falls.  He denies any difficulty swallowing.  He has spinal stenosis and had recent injection.  He is seeing Dr. Sharol Given for a foot problem.  He is currently on Sinemet 3 times a day before meals.  He has not been exercising and was encouraged to do so.  He has not had any hallucinations or increased confusion.  Noted very intermittent left tremor of the hand.  Patient reports some anxiety in public.  He was encouraged to perform stress relaxation techniques.  He returns for reevaluation  : Update 02/23/2019 :  he returns for follow-up after last visit 6 months ago.  He states his tremors are doing well until a month ago when he is noticed worsening of his tremor both at rest as well as using his hands.  He remains on Sinemet 25/100 mg 1-1/2 tablet 3 times daily.  Is tolerating them well without any nausea, diarrhea, dizziness, sleepiness or hallucinations.  He states his short-term memory difficulties are unchanged.  He has not been socializing a lot or being active but plans to do that soon.  He has no other complaints.  He denies significant drooling, bradykinesia, stooped posture gait or balance problems Update 09/04/2019 : He returns for follow-up after last visit 6 months ago.  He states that his tremors continue to intermittently not do well.  He has no more bad days than good days.  Has noticed some tremor in his legs as well intermittently.  Patient was advised to try higher dose of Sinemet 2 tablets 3 times daily at last visit.  He states he was not able to tolerate this because of dizziness and had to cut back to 1-1/2 3 times daily which is tolerating better.  He is also noticed that his balance is off but he blames this on having some problems in his toes.  He has had no major falls or injuries.  He can still get up easily from his chair.  He denies significant drooling of saliva.  He does have some at times disturbed sleep and reviewed dreams and talks in his sleep.  Gets startled  easily. Update 01/10/2020 : He returns for follow-up after last visit 4 months ago.  Is accompanied by his wife.  The patient was started on Azilect at last visit but had trouble tolerating it daily due to sleepiness, tiredness and dizziness.  He has since switched to using Azilect 0.5 mg twice daily every other day and seems to be tolerating it better.  However his tremors still persists and appear to get worse mostly when he is anxious or with severe exertion.  He feels the tremor are not particularly disabling and he can handle it.  Continues to have poor short-term memory and needs to write things down  but still remembers only occasionally.  Sometimes he may remember later.  Wife notes that occasionally he may pause in midsentence and searches for words.  At times even starts stuttering as he is trying to find the word.  He has no family history of Alzheimer's or dementia.  Still fairly independent and manages most activities for himself.  He remains currently on Sinemet 25/100 1.5 tablets 4 times daily which is tolerating well without significant nightmares, hallucinations, daytime sleepiness or nausea. Update 06/28/2020 : He returns for follow-up after last visit 6 months ago.  He is accompanied by his wife.  He continues to have intermittent tremors mostly in the left hand which are bothersome and he has good days and bad days.  Patient still continues to take Artane 2 mg tablets only half a tablet twice daily on alternate days and not every day as instructed at last visit.  He remains on Sinemet 25/100 mg 1.5 tablets 3 times daily and he has had trouble tolerating higher doses in the past.  Is not having significant dizziness or upset stomach but is still having some disturbed sleep with crazy dreams.  He denies hallucinations or delusions.  He is careful with his walking and has had a few near falls but no major falls or injuries.  He denies excessive drooling of saliva or bradykinesia or mobility issues.   He does admit to decreased short-term memory, attention and concentration difficulties but these appear to be stable and unchanged. Update 10/01/2020 Dr. [name]: He returns for follow-up after last visit 3 months ago.  Patient did not tolerate increasing the dose of Artane to 2 mg daily as he developed increased dryness of mouth as well as bad dreams.  He has since reduced the dose back to 1 mg daily and is feeling better.  He still has intermittent resting tremor in both left hand as well as leg which does interfere with his activities of daily living slightly.  He remains on Sinemet 25/100 mg 1.5 tablets 3 times daily which is tolerating quite well without any dizziness, sleepiness, upset stomach, nightmares or hallucinations.  He notices occasional lip twitchings in the morning only.  He has no bradykinesia or increased drooling.  He can get up easily and to most of his activities without help.  He continues to have short-term memory problems which appear unchanged.  He forgets recent appointments and events.  He states he has decreased hearing bilaterally which is also seems to complicate his understanding and memory.  He is however quite independent in activities of daily living.  Update 04/07/2021 JM: Returns for 39-monthfollow-up after prior visit with Dr. SLeonie Man  He is accompanied by his wife, BPamala Hurry Since prior visit, was hopitalized back in June for GI bleed.  He developed acute confusion most likely hospital delirium requiring short course of Precedex. Per wife, she is concerned regarding worsening cognition with short term memory and delayed recall since that time as well as multiple other concerns including worsening lower back pain, decreased hearing and vision and chills/coldness sensation in both feet and hands. Tremors have been stable with ongoing use of Sinemet and Artane.  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  Continues to struggle with short-term memory and recall which he feels has continued to gradually, slowly decline. Examples he gives includes walking into a room and forget why he went into room or repeat similar questions. Did recently  receive hearing aide for left ear and continued use of hearing aide in right ear. At times he will feel like he is in a "fog" typically associated with lightheadedness, is aware these are happening, does not lose consciousness. Per wife, this has been improving as she has been trying to encourage him to drink more water.  Will feel down or depressed due to his memory issues but otherwise denies depression. Does do memory exercises occasionally at home.  Is currently doing PT after back surgery - does report great improvement of pain since procedure. Did have post anesthesia delirium requiring short-term use of Seroquel but this has since resolved. Wife is concerned as he needs to have surgical procedure for umbilical hernia and fears recurrence of delirium. No behavioral concerns such as hallucinations, agitation, sleep difficulty, or confusion.  Remains on Cerefolin but does not believe it has been that beneficial. MMSE today 27/30 (prior 26/30)  Tremors stable with ongoing use of Sinemet and Artane, tolerating without side effects. Denies any issues with drooling, freezing, gait impairement, swallowing difficulties or bradykinesia.  No further concerns at this time   Update 04/09/2022 JM: Patient returns for 38-monthfollow-up.  Cognition: Has been stable since prior visit He has remained on Aricept 10 mg nightly, denies side effects Difficulties with short term memory, can forget certain tasks he is supposed to be doing, at times will be able to recall but sometime next will need to ask wife to remind him.  He does endorse some underlying anxiety, can have difficulty making decisions on his own such as what he should be wearing that day.    Parkinsonian  tremor: Remains on sinmet, occasionally misses dosage, currently prescribed taking 3 times per day, tries to remember to take at 6:30am, 12pm and 5:30-6pm. Unsure if he is having issues with wearing off but wife has told him symptoms can worsen prior to next dosage Also remains on Artane half tablet twice daily Can have slowness of movements and gait at times, does not use assistive device, denies any recent falls       REVIEW OF SYSTEMS: Full 14 system review of systems performed and notable only for those listed in HPI only and all other systems negative   ALLERGIES: Allergies  Allergen Reactions   Anesthesia S-I-40 [Propofol] Other (See Comments)   Other Other (See Comments)    Severe delirium with anesthesia per spouse enhances parkinsons symptoms    HOME MEDICATIONS: Outpatient Medications Prior to Visit  Medication Sig Dispense Refill   acetaminophen (TYLENOL) 500 MG tablet Take 2 tablets (1,000 mg total) by mouth every 6 (six) hours as needed for mild pain.     alfuzosin (UROXATRAL) 10 MG 24 hr tablet Take 10 mg by mouth daily.     carbidopa-levodopa (SINEMET IR) 25-100 MG tablet TAKE 1 AND 1/2 TABLETS BY MOUTH THREE TIMES DAILY 405 tablet 1   citalopram (CELEXA) 20 MG tablet TAKE ONE TABLET BY MOUTH EVERYDAY AT BEDTIME 90 tablet 1   donepezil (ARICEPT) 10 MG tablet Take 1 tablet by mouth at bedtime. 30 tablet 5   dorzolamide-timolol (COSOPT) 22.3-6.8 MG/ML ophthalmic solution Instill 1 drop into both eyes twice a day 10 mL 11   FIBER ADULT GUMMIES PO Take 1 tablet by mouth 2 (two) times daily.     ibuprofen (ADVIL) 600 MG tablet Take 1 tablet (600 mg total) by mouth every 8 (eight) hours as needed for moderate pain. 60 tablet 1   irbesartan (AVAPRO) 75 MG  tablet TAKE ONE TABLET BY MOUTH EVERY MORNING 90 tablet 0   latanoprost (XALATAN) 0.005 % ophthalmic solution Place 1 drop into the left eye daily. 2.5 mL 8   Multiple Vitamins-Minerals (MULTIVITAMIN ADULTS 50+ PO) Take 1  tablet by mouth daily.     nitroGLYCERIN (NITROSTAT) 0.4 MG SL tablet Place 1 tablet (0.4 mg total) under the tongue every 5 (five) minutes as needed for chest pain. 25 tablet 3   pantoprazole (PROTONIX) 40 MG tablet TAKE ONE TABLET BY MOUTH EVERY MORNING and TAKE ONE TABLET BY MOUTH EVERY EVENING 180 tablet 0   Pitavastatin Calcium 2 MG TABS Take every other day 45 tablet 3   timolol (TIMOPTIC) 0.5 % ophthalmic solution Instill 1 drop into both eyes every morning 30 mL 4   trihexyphenidyl (ARTANE) 2 MG tablet Take 1/2 tablet (1 mg total) by mouth 2 (two) times daily with a meal. 30 tablet 5   verapamil (CALAN-SR) 120 MG CR tablet TAKE ONE TABLET BY MOUTH EVERY MORNING 90 tablet 0   No facility-administered medications prior to visit.    PAST MEDICAL HISTORY: Past Medical History:  Diagnosis Date   Allergy    seasonal   Arthritis    BPH (benign prostatic hyperplasia)    Complication of anesthesia    Pt. stated he had a reaction that ended in him requiring urinary cath placement; hx delirium worsening parkinson symptoms   Diverticulosis    GERD (gastroesophageal reflux disease)    esophageal spasms   Glaucoma    Gout    Head injury, closed, with concussion    Hepatitis C    chronic - Has been treated with Harvoni   HLD (hyperlipidemia)    statin intolerant (Crestor & Simvastatin) - Taking Livalo '1mg'$  / week   Hypertension    Parkinson's disease ([place])    Plantar fasciitis    right   PVD (peripheral vascular disease) ([place])    With no claudication; only mild abdominal aortic atherosclerosis noted on ultrasound.   Spinal stenosis of lumbar region    Thoracic ascending aortic aneurysm ([facility])    4.2 cm ascending TAA 09/2016 CT, 1 yr f/u rec   Ulcer     PAST SURGICAL HISTORY: Past Surgical History:  Procedure Laterality Date   BUNIONECTOMY WITH WEIL OSTEOTOMY Right 11/02/2019   Procedure: Right Foot Lapidus, Modified McBride Bunionectomy,  Hallux Akin Osteotomy;  Surgeon: Wylene Simmer, MD;  Location: [place];  Service: Orthopedics;  Laterality: Right;   CARDIAC CATHETERIZATION  2005   30% Cx. Dr. Melvern Banker   cataract surgery Left 11/05/2014   COLONOSCOPY     COLONOSCOPY N/A 01/09/2021   Procedure: COLONOSCOPY;  Surgeon: Ronnette Juniper, MD;  Location: [facility];  Service: Gastroenterology;  Laterality: N/A;   ESOPHAGOGASTRODUODENOSCOPY N/A 05/02/2015   Procedure: ESOPHAGOGASTRODUODENOSCOPY (EGD);  Surgeon: Ronald Lobo, MD;  Location: [facility];  Service: Endoscopy;  Laterality: N/A;   ESOPHAGOGASTRODUODENOSCOPY  05/02/2015   no source of pt chest pain endoscopically evident. small hiatal hernia.   ESOPHAGOGASTRODUODENOSCOPY (EGD) WITH PROPOFOL N/A 01/02/2021   Procedure: ESOPHAGOGASTRODUODENOSCOPY (EGD) WITH PROPOFOL;  Surgeon: Clarene Essex, MD;  Location: [facility];  Service: Endoscopy;  Laterality: N/A;   EYE SURGERY     HARDWARE REMOVAL Right 11/02/2019   Procedure: Second Metatarsal Removal of Deep Implant and Rotational Osteotomy;  Surgeon: Wylene Simmer, MD;  Location: [place];  Service: Orthopedics;  Laterality: Right;   IR ANGIOGRAM SELECTIVE EACH ADDITIONAL VESSEL  01/04/2021  IR ANGIOGRAM SELECTIVE EACH ADDITIONAL VESSEL  01/04/2021   IR ANGIOGRAM VISCERAL SELECTIVE  01/04/2021   IR US GUIDE VASC ACCESS RIGHT  01/04/2021   LUMBAR LAMINECTOMY/DECOMPRESSION MICRODISCECTOMY N/A 07/03/2021   Procedure: Lumbar three through five decompression with lumbar three through five insitu fusion;  Surgeon: Melina Schools, MD;  Location: [place];  Service: Orthopedics;  Laterality: N/A;   MEMBRANE PEEL Left 03/14/2014   Procedure: MEMBRANE PEEL; ENDOLASER;  Surgeon: Hurman Horn, MD;  Location: [place];  Service: Ophthalmology;  Laterality: Left;   NM MYOVIEW LTD  10/2015   LOW RISK. Small, fixed basal lateral defect - likely diaphragmatic attenuation. 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  No R WMA.  GRII DD-moderately dilated LA..  Mildly dilated RV but normal function.  Normal RAP/CVP.  Trivial AI with mild to moderate sclerosis-no stenosis   UPPER GASTROINTESTINAL ENDOSCOPY     WEIL OSTEOTOMY Right 09/01/2017   Procedure: RIGHT GREAT TOE CHEVRON AND WEIL OSTEOTOMY 2ND METATARSAL;  Surgeon: Newt Minion, MD;  Location: [facility];  Service: Orthopedics;  Laterality: Right;   XI ROBOTIC ASSISTED VENTRAL HERNIA N/A 03/17/2022   Procedure: XI ROBOTIC ASSISTED VENTRAL HERNIA;  Surgeon: [name], MD;  Location: [facility];  Service: General;  Laterality: N/A;    FAMILY HISTORY: Family History  Problem Relation Age of Onset   Heart disease Mother    Cancer Paternal Grandfather    Cancer Sister     SOCIAL HISTORY: Social History   Socioeconomic History   Marital status: Married    Spouse name: barbra gen   Number of children: 3   Years of education: AS   Highest education level: Not on file  Occupational History   Occupation: self employed    Employer: [employer]    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  Tobacco Use   Smoking status: Former    Types: Cigars    Quit date: 07/27/1988    Years since quitting: 33.7   Smokeless tobacco: Never  Vaping Use   Vaping Use: Never used  Substance and Sexual Activity   Alcohol use: Yes    Alcohol/week: 3.0 standard drinks of alcohol    Types: 1 Glasses of wine, 1 Cans of beer, 1 Shots of liquor per week    Comment: occasional   Drug use: No   Sexual activity: Yes  Other Topics Concern   Not on file  Social History Narrative   Lives with wife    Right handed   Drinks 1-2 cups caffeine daily   Social Determinants of Health   Financial Resource Strain: Low Risk  (07/24/2021)   Overall Financial Resource  Strain (CARDIA)    Difficulty of Paying Living Expenses: Not very hard  Food Insecurity: Not on file  Transportation Needs: No Transportation Needs (07/24/2021)   PRAPARE - Hydrologist (Medical): No    Lack of Transportation (Non-Medical): No  Physical Activity: Not on file  Stress: Not on file  Social Connections: Not on file  Intimate Partner Violence: Not on file     PHYSICAL EXAM  There were no vitals filed for this visit.   There is no  height or weight on file to calculate BMI.  Generalized: Very pleasant elderly African-American male in no acute distress   Head: normocephalic and atraumatic  Musculoskeletal: No deformity  Neurological examination  Mentation: Awake and alert.  Fluent speech and language.    10/06/2021    2:34 PM 04/07/2021    2:38 PM 08/24/2018    9:25 AM  MMSE - Mini Mental State Exam  Orientation to time '5 5 5  '$ Orientation to Place '5 5 5  '$ Registration '3 3 3  '$ Attention/ Calculation '5 5 5  '$ Recall '2 1 2  '$ Language- name 2 objects '2 2 2  '$ Language- repeat '1 1 1  '$ Language- follow 3 step command '1 2 3  '$ Language- read & follow direction '1 1 1  '$ Write a sentence '1 1 1  '$ Write a sentence-comments   no subject  Copy design 1 0 1  Total score '27 26 29   '$ Cranial nerve II-XII: Pupils were equal round reactive to light extraocular movements were full, visual field were full on confrontational test. Facial sensation and strength were normal.  Hearing aids bilaterally.  Uvula tongue midline. head turning and shoulder shrug were normal and symmetric.Tongue protrusion into cheek strength was normal. Motor: normal bulk and tone, full strength in the BUE, BLE, .  Diminished amplitude of finger tapping on the left, intermittent left hand and leg resting tremor, mild cogwheel rigidity upon activation at the left wrist. Tremor improves with action and intention.  No bradykinesia bilaterally. Unable to appreciate tremor in RUE  Sensory: normal  and symmetric to light touch, on the face arms and legs  Coordination: finger-nose-finger, heel-to-shin bilaterally, no dysmetria Reflexes: 1+ upper lower and symmetric plantar responses were flexor bilaterally. Gait and Station: Rising up from seated position without assistance, no festination or stooped posture.  Ambulated well with moderate stride, decreased arm swing on the left., smooth turning, no assistive device.     DIAGNOSTIC DATA (LABS, IMAGING, TESTING) - I reviewed patient records, labs, notes, testing and imaging myself where available.  Lab Results  Component Value Date   WBC 5.1 03/17/2022   HGB 11.8 (L) 03/17/2022   HCT 36.4 (L) 03/17/2022   MCV 87.1 03/17/2022   PLT 218 03/17/2022      Component Value Date/Time   NA 137 10/09/2021 1328   K 4.5 10/09/2021 1328   CL 101 10/09/2021 1328   CO2 23 10/09/2021 1328   GLUCOSE 88 10/09/2021 1328   GLUCOSE 92 06/30/2021 1430   BUN 20 10/09/2021 1328   CREATININE 1.20 10/09/2021 1328   CREATININE 1.06 06/29/2014 0920   CALCIUM 9.1 10/09/2021 1328   PROT 7.1 10/09/2021 1328   ALBUMIN 4.5 10/09/2021 1328   AST 18 10/09/2021 1328   ALT 11 10/09/2021 1328   ALKPHOS 73 10/09/2021 1328   BILITOT 0.3 10/09/2021 1328   GFRNONAA >60 06/30/2021 1430   GFRAA 68 09/10/2020 1058   Lab Results  Component Value Date   CHOL 165 10/09/2021   HDL 58 10/09/2021   LDLCALC 98 10/09/2021   TRIG 41 10/09/2021   CHOLHDL 2.8 10/09/2021   Lab Results  Component Value Date   TSH 3.131 01/11/2021     ASSESSMENT AND PLAN 71 y.o. year old male  has a medical history of resting left arm and leg tremors with mild features of early left sided Parkinson's disease initially present in 2014. He also complains of mild cognitive impairment    1.  Parkinsonian tremor -tremor stable,  some issues with gait -discussed importance of taking Sinemet 1.5 tab TID as prescribed, discussed way to help prevent missed dosages, advised once taking  consistently as prescribed and if still having issues, can consider increasing dosage at that time -Continue Artane at current dosage -Declines interest in deep brain stimulation    2.  MCI -subjective difficult with short term memory - discussed routinely doing memory exercises and ensuring routine physical activity/exercise, ensuring adequate water intake, healthy diet, good sleep and managing stress levels.  -Prior MMSE 27/30 - unable to repeat today due to time condition (pt was late to appointment) -Continue Aricept 10 mg nightly -some underlying anxiety which may be contributing, advised to further discuss with PCP     Follow-up in 6 months or call earlier if needed     CC:  Denita Lung, MD   I spent 31 minutes of face-to-face and non-face-to-face time with patient and wife.  This included previsit chart review, lab review, study review, order entry, electronic health record documentation, patient education and discussion regarding diagnoses and treatment plan and answered all the questions to patient and wife satisfaction  Frann Rider, AGNP-BC  [facility] [address] [place],  [address]  Phone [phone] Fax [phone] Note: This document was prepared with digital dictation and possible smart phrase technology. Any transcriptional errors that result from this process are unintentional.

## 2022-04-21 ENCOUNTER — Encounter (INDEPENDENT_AMBULATORY_CARE_PROVIDER_SITE_OTHER): Payer: Medicare Other | Admitting: Ophthalmology

## 2022-04-23 ENCOUNTER — Encounter (INDEPENDENT_AMBULATORY_CARE_PROVIDER_SITE_OTHER): Payer: Self-pay | Admitting: Ophthalmology

## 2022-04-23 ENCOUNTER — Ambulatory Visit (INDEPENDENT_AMBULATORY_CARE_PROVIDER_SITE_OTHER): Payer: Medicare Other | Admitting: Ophthalmology

## 2022-04-23 DIAGNOSIS — H34812 Central retinal vein occlusion, left eye, with macular edema: Secondary | ICD-10-CM

## 2022-04-23 DIAGNOSIS — H18231 Secondary corneal edema, right eye: Secondary | ICD-10-CM | POA: Diagnosis not present

## 2022-04-23 DIAGNOSIS — H4051X0 Glaucoma secondary to other eye disorders, right eye, stage unspecified: Secondary | ICD-10-CM | POA: Diagnosis not present

## 2022-04-23 DIAGNOSIS — H401121 Primary open-angle glaucoma, left eye, mild stage: Secondary | ICD-10-CM | POA: Diagnosis not present

## 2022-04-23 NOTE — Assessment & Plan Note (Signed)
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

## 2022-04-23 NOTE — Progress Notes (Signed)
04/23/2022     CHIEF COMPLAINT Patient presents for  Chief Complaint  Patient presents with   Retina Evaluation      HISTORY OF PRESENT ILLNESS: [name] is a 71 y.o. male who presents to the clinic today for:   HPI   Neovascular glaucoma, right eye, stage unspecified,  6 mths dilate ou color fp oct Pt states his vision has not been stable Pt denies any new floaters Pt admits to FOL and neon lights Pt states he is having blurry vision and having a hard time being able to focus Last edited by Hurman Horn, MD on 04/23/2022  3:43 PM.      Referring physician: Denita Lung, MD [place],  [place] [address]  HISTORICAL INFORMATION:   Selected notes from the [place]: Current Outpatient Medications (Ophthalmic Drugs)  Medication Sig   dorzolamide-timolol (COSOPT) 22.3-6.8 MG/ML ophthalmic solution Instill 1 drop into both eyes twice a day   latanoprost (XALATAN) 0.005 % ophthalmic solution Place 1 drop into the left eye daily.   timolol (TIMOPTIC) 0.5 % ophthalmic solution Instill 1 drop into both eyes every morning   No current facility-administered medications for this visit. (Ophthalmic Drugs)   Current Outpatient Medications (Other)  Medication Sig   acetaminophen (TYLENOL) 500 MG tablet Take 2 tablets (1,000 mg total) by mouth every 6 (six) hours as needed for mild pain.   alfuzosin (UROXATRAL) 10 MG 24 hr tablet Take 10 mg by mouth daily.   carbidopa-levodopa (SINEMET IR) 25-100 MG tablet Take 1.5 tablets by mouth 3 (three) times daily.   citalopram (CELEXA) 20 MG tablet TAKE ONE TABLET BY MOUTH EVERYDAY AT BEDTIME   donepezil (ARICEPT) 10 MG tablet Take 1 tablet by mouth at bedtime.   FIBER ADULT GUMMIES PO Take 1 tablet by mouth 2 (two) times daily.   ibuprofen (ADVIL) 600 MG tablet Take 1 tablet (600 mg total) by mouth every 8 (eight) hours as needed for moderate pain.   irbesartan (AVAPRO)  75 MG tablet TAKE ONE TABLET BY MOUTH EVERY MORNING   Multiple Vitamins-Minerals (MULTIVITAMIN ADULTS 50+ PO) Take 1 tablet by mouth daily.   nitroGLYCERIN (NITROSTAT) 0.4 MG SL tablet Place 1 tablet (0.4 mg total) under the tongue every 5 (five) minutes as needed for chest pain.   pantoprazole (PROTONIX) 40 MG tablet TAKE ONE TABLET BY MOUTH EVERY MORNING and TAKE ONE TABLET BY MOUTH EVERY EVENING   Pitavastatin Calcium 2 MG TABS Take every other day   trihexyphenidyl (ARTANE) 2 MG tablet Take 1/2 tablet (1 mg total) by mouth 2 (two) times daily with a meal.   verapamil (CALAN-SR) 120 MG CR tablet TAKE ONE TABLET BY MOUTH EVERY MORNING   No current facility-administered medications for this visit. (Other)      REVIEW OF SYSTEMS: ROS   Negative for: Constitutional, Gastrointestinal, Neurological, Skin, Genitourinary, Musculoskeletal, HENT, Endocrine, Cardiovascular, Eyes, Respiratory, Psychiatric, Allergic/Imm, Heme/Lymph Last edited by Morene Rankins, CMA on 04/23/2022  2:28 PM.       ALLERGIES Allergies  Allergen Reactions   Anesthesia S-I-40 [Propofol] Other (See Comments)   Other Other (See Comments)    Severe delirium with anesthesia per spouse enhances parkinsons symptoms    PAST MEDICAL HISTORY Past Medical History:  Diagnosis Date   Allergy    seasonal   Arthritis    BPH (benign prostatic hyperplasia)    Complication of anesthesia  Pt. stated he had a reaction that ended in him requiring urinary cath placement; hx delirium worsening parkinson symptoms   Diverticulosis    GERD (gastroesophageal reflux disease)    esophageal spasms   Glaucoma    Gout    Head injury, closed, with concussion    Hepatitis C    chronic - Has been treated with Harvoni   HLD (hyperlipidemia)    statin intolerant (Crestor & Simvastatin) - Taking Livalo '1mg'$  / week   Hypertension    Parkinson's disease ([place])    Plantar fasciitis    right   PVD (peripheral vascular disease) ([place])     With no claudication; only mild abdominal aortic atherosclerosis noted on ultrasound.   Spinal stenosis of lumbar region    Thoracic ascending aortic aneurysm (HCC)    4.2 cm ascending TAA 09/2016 CT, 1 yr f/u rec   Ulcer    Past Surgical History:  Procedure Laterality Date   BUNIONECTOMY WITH WEIL OSTEOTOMY Right 11/02/2019   Procedure: Right Foot Lapidus, Modified McBride Bunionectomy,  Hallux Akin Osteotomy;  Surgeon: Wylene Simmer, MD;  Location: [facility];  Service: Orthopedics;  Laterality: Right;   CARDIAC CATHETERIZATION  2005   30% Cx. Dr. Melvern Banker   cataract surgery Left 11/05/2014   COLONOSCOPY     COLONOSCOPY N/A 01/09/2021   Procedure: COLONOSCOPY;  Surgeon: Ronnette Juniper, MD;  Location: [facility];  Service: Gastroenterology;  Laterality: N/A;   ESOPHAGOGASTRODUODENOSCOPY N/A 05/02/2015   Procedure: ESOPHAGOGASTRODUODENOSCOPY (EGD);  Surgeon: Ronald Lobo, MD;  Location: [facility];  Service: Endoscopy;  Laterality: N/A;   ESOPHAGOGASTRODUODENOSCOPY  05/02/2015   no source of pt chest pain endoscopically evident. small hiatal hernia.   ESOPHAGOGASTRODUODENOSCOPY (EGD) WITH PROPOFOL N/A 01/02/2021   Procedure: ESOPHAGOGASTRODUODENOSCOPY (EGD) WITH PROPOFOL;  Surgeon: Clarene Essex, MD;  Location: [facility];  Service: Endoscopy;  Laterality: N/A;   EYE SURGERY     HARDWARE REMOVAL Right 11/02/2019   Procedure: Second Metatarsal Removal of Deep Implant and Rotational Osteotomy;  Surgeon: Wylene Simmer, MD;  Location: [facility];  Service: Orthopedics;  Laterality: Right;   IR ANGIOGRAM SELECTIVE EACH ADDITIONAL VESSEL  01/04/2021   IR ANGIOGRAM SELECTIVE EACH ADDITIONAL VESSEL  01/04/2021   IR ANGIOGRAM VISCERAL SELECTIVE  01/04/2021   IR US GUIDE VASC ACCESS RIGHT  01/04/2021   LUMBAR LAMINECTOMY/DECOMPRESSION MICRODISCECTOMY N/A 07/03/2021   Procedure: Lumbar three through five decompression with lumbar three through five insitu fusion;   Surgeon: Melina Schools, MD;  Location: [facility];  Service: Orthopedics;  Laterality: N/A;   MEMBRANE PEEL Left 03/14/2014   Procedure: MEMBRANE PEEL; ENDOLASER;  Surgeon: Hurman Horn, MD;  Location: [facility];  Service: Ophthalmology;  Laterality: Left;   NM MYOVIEW LTD  10/2015   LOW RISK. Small, fixed basal lateral defect - likely diaphragmatic attenuation. 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  No R WMA.  GRII DD-moderately dilated LA..  Mildly dilated RV but normal function.  Normal RAP/CVP.  Trivial AI with mild to moderate sclerosis-no stenosis   UMBILICAL HERNIA REPAIR  2023   UPPER GASTROINTESTINAL ENDOSCOPY     WEIL OSTEOTOMY Right 09/01/2017   Procedure: RIGHT GREAT TOE  CHEVRON AND WEIL OSTEOTOMY 2ND METATARSAL;  Surgeon: Newt Minion, MD;  Location: [facility];  Service: Orthopedics;  Laterality: Right;   XI ROBOTIC ASSISTED VENTRAL HERNIA N/A 03/17/2022   Procedure: XI ROBOTIC ASSISTED VENTRAL HERNIA;  Surgeon: [name], MD;  Location: [facility];  Service: General;  Laterality: N/A;    FAMILY HISTORY Family History  Problem Relation Age of Onset   Heart disease Mother    Cancer Paternal Grandfather    Cancer Sister     SOCIAL HISTORY Social History   Tobacco Use   Smoking status: Former    Types: Cigars    Quit date: 07/27/1988    Years since quitting: 33.7   Smokeless tobacco: Never  Vaping Use   Vaping Use: Never used  Substance Use Topics   Alcohol use: Yes    Alcohol/week: 3.0 standard drinks of alcohol    Types: 1 Glasses of wine, 1 Cans of beer, 1 Shots of liquor per week    Comment: occasional   Drug use: No         OPHTHALMIC EXAM:  Base Eye Exam     Visual Acuity (ETDRS)        Right Left   Dist cc CF at 3' 20/20 -1    Correction: Glasses         Tonometry (Tonopen, 2:32 PM)       Right Left   Pressure 14 7         Extraocular Movement       Right Left    Ortho Ortho    -- -- --  --  --  -- -- --   -- -- --  --  --  -- -- --           Neuro/Psych     Oriented x3: Yes   Mood/Affect: Normal         Dilation     Both eyes: 1.0% Mydriacyl, 2.5% Phenylephrine @ 2:30 PM           Slit Lamp and Fundus Exam     External Exam       Right Left   External Normal Normal         Slit Lamp Exam       Right Left   Lids/Lashes Normal Normal   Conjunctiva/Sclera White and quiet White and quiet   Cornea Slight thickening, no corneal touch with tube, mostly clear, Striae 1+ Clear   Anterior Chamber Tube, Tube with  NO corneal touch, mild corneal thickening, mild corneal edema no change not visual acuity limiting Deep and quiet   Iris Round and reactive Round and reactive   Lens Anterior chamber intraocular lens Posterior chamber intraocular lens   Anterior Vitreous Normal Normal         Fundus Exam       Right Left   Posterior Vitreous Vitrectomized, clear Vitrectomized   Disc 2+ Pallor temporal 1+ Pallor temporal   C/D Ratio 0.75 0.55   Macula Diffuse atrophy, no macular thickening Microaneurysms, no macular thickening   Vessels Old, RVO old vasculitis now inactive Old macular BRVO, inactive   Periphery Good PRP Good PRP sector, no retinal holes or tears            IMAGING AND PROCEDURES  Imaging and Procedures for 04/23/22  OCT, Retina - OU - Both Eyes       Right Eye Quality was good. Scan locations included subfoveal. Central Foveal Thickness: 184.  Findings include central retinal atrophy, outer retinal atrophy.   Left Eye Quality was good. Scan locations included subfoveal. Central Foveal Thickness: 365. Progression has been stable.   Notes Diffuse macular atrophy right eye no interval change  Mild  diffuse macular thickening left eye with no active CME.  No change in foveal elevation over the last 2 years.  Minor CME superonasal, but this is old and involutional and unchanged for years with no encroachment to center of the fovea      Color Fundus Photography Optos - OU - Both Eyes       Right Eye Progression has been stable. Disc findings include pallor.   Left Eye Progression has been stable. Disc findings include pallor. Vessels : normal observations. Periphery : normal observations.   Notes OD with cloudy media secondary to corneal opacity, striae from corneal edema  Retina attached, optic nerve atrophy good PRP peripheral   OS, mild temporal optic atrophy. 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  Accounts for peripheral vision constriction as well as previous ischemic disease made worse by previous vasculitic process from hepatitis C.  Neovascular glaucoma, right eye, stage unspecified Stabilize no active disease.  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 Neovascular glaucoma, right eye, stage unspecified  H40.51X0 OCT, Retina - OU - Both Eyes    Color Fundus Photography Optos - OU - Both Eyes    2. 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 Secondary corneal edema of right eye  H18.231     4. Primary open angle glaucoma of left eye, mild stage  H40.1121       1.  OS stable acuity and stable overall.  Normal intraocular pressures doing well on current topical therapy continue  2.  Vascular compromise left eye and  optic nerve from [facility] but also from previous hemispheric CRVO and vascular disease all of which are now controlled not active  3.  Continue on topical IOP lowering agents as scheduled.  Ophthalmic Meds Ordered this visit:  No orders of the defined types were placed in this encounter.      Return in about 6 months (around 10/22/2022) for DILATE OU, COLOR FP, OCT.  There are no Patient Instructions on file for this visit.   Explained the diagnoses, plan, and follow up with the patient and they expressed understanding.  Patient expressed understanding of the importance of proper follow up care.   Clent Demark Zaquan Duffner M.D. Diseases & Surgery of the Retina and Vitreous [facility] Diabetic [place] 04/23/22     Abbreviations: M myopia (nearsighted); A astigmatism; H hyperopia (farsighted); P presbyopia; Mrx spectacle prescription;  CTL contact lenses; OD right eye; OS left eye; OU both eyes  XT exotropia; ET esotropia; PEK punctate epithelial keratitis; PEE punctate epithelial erosions; DES dry eye syndrome; MGD meibomian gland dysfunction; ATs artificial tears; PFAT's preservative free artificial tears; [place] nuclear sclerotic cataract; PSC posterior subcapsular cataract; ERM epi-retinal membrane; PVD posterior vitreous detachment; RD retinal detachment;  DM diabetes mellitus; DR diabetic retinopathy; NPDR non-proliferative diabetic retinopathy; PDR proliferative diabetic retinopathy; CSME clinically significant macular edema; DME diabetic macular edema; dbh dot blot hemorrhages; CWS cotton wool spot; POAG primary open angle glaucoma; C/D cup-to-disc ratio; HVF humphrey visual field; GVF goldmann visual field; OCT optical coherence tomography; IOP intraocular pressure; BRVO Branch retinal vein occlusion; CRVO central retinal vein occlusion; CRAO central retinal artery occlusion; BRAO branch retinal artery occlusion; RT retinal tear; SB scleral buckle; PPV pars plana vitrectomy; VH Vitreous hemorrhage; PRP  panretinal laser photocoagulation; IVK intravitreal kenalog; VMT vitreomacular traction; MH Macular hole;  NVD neovascularization of the disc; NVE neovascularization elsewhere; AREDS age related eye disease study; ARMD age related macular degeneration; POAG primary open angle glaucoma; EBMD epithelial/anterior basement membrane dystrophy; ACIOL anterior chamber intraocular lens; IOL intraocular lens; PCIOL posterior chamber intraocular lens; Phaco/IOL phacoemulsification with intraocular lens placement; [employer] photorefractive keratectomy; LASIK laser assisted in situ keratomileusis; HTN hypertension; DM diabetes mellitus; COPD chronic obstructive pulmonary disease

## 2022-04-23 NOTE — Assessment & Plan Note (Signed)
OS stable overall continue on topical medications

## 2022-04-23 NOTE — Assessment & Plan Note (Signed)
Stable central acuity no residual CME.  Accounts for peripheral vision constriction as well as previous ischemic disease made worse by previous vasculitic process from hepatitis C.

## 2022-04-23 NOTE — Assessment & Plan Note (Signed)
Stabilize no active disease.  Good PRP 360

## 2022-04-28 ENCOUNTER — Encounter (INDEPENDENT_AMBULATORY_CARE_PROVIDER_SITE_OTHER): Payer: Medicare Other | Admitting: Ophthalmology

## 2022-04-29 ENCOUNTER — Telehealth: Payer: Self-pay | Admitting: Pharmacist

## 2022-04-29 NOTE — Chronic Care Management (AMB) (Unsigned)
Chronic Care Management Pharmacy Assistant   Name: MAESON PUROHIT  MRN: [id number] DOB: [birth year]  Reason for Encounter: Medication Review/ Medication Coordination   Recent office visits:  None   Recent consult visits:  04/23/22 Hurman Horn, MD (Ophthalmology) - Patient presented for Neovascular glaucoma of right eye stage unspecified and other concerns. No medication changes.  04/09/22 Frann Rider, NP (Neurology) - Patient presented for Parkinsonian tremor and other concerns. No medication changes.  04/08/22 [name], MD (Gen Surg) - Patient presented for Ventral hernia without obstruction or gangrene and other concerns. Stopped Oxycodone HCL.  Hospital visits:  Medication Reconciliation was completed by comparing discharge summary, patient's EMR and Pharmacy list, and upon discussion with patient.   Patient presented to [facility] on 03/17/22 due to Ventral hernia without obstruction or gangrene. Patient was present for 6 hours   New?Medications Started at [facility] Discharge:?? -started  acetaminophen (TYLENOL) ibuprofen (ADVIL) oxyCODONE (Oxy IR/ROXICODONE)   Medication Changes at Hospital Discharge: -Changed  none   Medications Discontinued at Hospital Discharge: -Stopped  none   Medications that remain the same after Hospital Discharge:??  -All other medications will remain the same.      Medications: Outpatient Encounter Medications as of 04/29/2022  Medication Sig   acetaminophen (TYLENOL) 500 MG tablet Take 2 tablets (1,000 mg total) by mouth every 6 (six) hours as needed for mild pain.   alfuzosin (UROXATRAL) 10 MG 24 hr tablet Take 10 mg by mouth daily.   carbidopa-levodopa (SINEMET IR) 25-100 MG tablet Take 1.5 tablets by mouth 3 (three) times daily.   citalopram (CELEXA) 20 MG tablet TAKE ONE TABLET BY MOUTH EVERYDAY AT BEDTIME   donepezil (ARICEPT) 10 MG tablet Take 1 tablet by mouth at bedtime.   dorzolamide-timolol  (COSOPT) 22.3-6.8 MG/ML ophthalmic solution Instill 1 drop into both eyes twice a day   FIBER ADULT GUMMIES PO Take 1 tablet by mouth 2 (two) times daily.   ibuprofen (ADVIL) 600 MG tablet Take 1 tablet (600 mg total) by mouth every 8 (eight) hours as needed for moderate pain.   irbesartan (AVAPRO) 75 MG tablet TAKE ONE TABLET BY MOUTH EVERY MORNING   latanoprost (XALATAN) 0.005 % ophthalmic solution Place 1 drop into the left eye daily.   Multiple Vitamins-Minerals (MULTIVITAMIN ADULTS 50+ PO) Take 1 tablet by mouth daily.   nitroGLYCERIN (NITROSTAT) 0.4 MG SL tablet Place 1 tablet (0.4 mg total) under the tongue every 5 (five) minutes as needed for chest pain.   pantoprazole (PROTONIX) 40 MG tablet TAKE ONE TABLET BY MOUTH EVERY MORNING and TAKE ONE TABLET BY MOUTH EVERY EVENING   Pitavastatin Calcium 2 MG TABS Take every other day   timolol (TIMOPTIC) 0.5 % ophthalmic solution Instill 1 drop into both eyes every morning   trihexyphenidyl (ARTANE) 2 MG tablet Take 1/2 tablet (1 mg total) by mouth 2 (two) times daily with a meal.   verapamil (CALAN-SR) 120 MG CR tablet TAKE ONE TABLET BY MOUTH EVERY MORNING   No facility-administered encounter medications on file as of 04/29/2022.  Reviewed chart for medication changes ahead of medication coordination call.  BP Readings from Last 3 Encounters:  04/09/22 126/82  04/08/22 (!) 164/96  03/17/22 (!) 160/108    No results found for: "HGBA1C"   Patient obtains medications through Adherence Packaging  30 Days   Last adherence delivery included:   Alfuzosin 10 Mg: Take one at Bedtime Verapamil (Calan-SR) 120 mg: Take one tab at breakfast  Citalopram (Celexa) 20 mg: Take one tab at bedtime Trihexyphenidyl (Artane) 2 mg: Take half tab at breakfast and half at dinner Carbidopa - Levodopa (Sinemet) 25-100 mg: Take one and a half tabs before breakfast, lunch and dinner Pantoprazole (Protonix) 40 mg: Take one tab at breakfast and dinner Irbesartan  (Avapro) 75 mg: Take one tab at breakfast Pitavastatin Calcium (Livalo) 2 mg: Take one tab every other day at breakfast Donepezil (Aricept) 10 mg: Take one tab at bedtime Dorzolamide - timolol : One drop in each eye 2x daily [name] Drops : One drop in each eye at bedtime   Call to Neuro to request prescription with refills for Aricept and [facility]ane [facility]     Confirmed delivery date of 04/10/22, advised patient that pharmacy will contact them the morning of delivery.     Patient is due for next adherence delivery on: 05/11/22. Called patient and reviewed medications and coordinated delivery. Packs 30 DS  This delivery to include: Alfuzosin 10 Mg: Take one at Bedtime Verapamil (Calan-SR) 120 mg: Take one tab at breakfast Citalopram (Celexa) 20 mg: Take one tab at bedtime Trihexyphenidyl (Artane) 2 mg: Take half tab at breakfast and half at dinner Carbidopa - Levodopa (Sinemet) 25-100 mg: Take one and a half tabs before breakfast, lunch and dinner Pantoprazole (Protonix) 40 mg: Take one tab at breakfast and dinner Irbesartan (Avapro) 75 mg: Take one tab at breakfast Pitavastatin Calcium (Livalo) 2 mg: Take one tab every other day at breakfast Donepezil (Aricept) 10 mg: Take one tab at bedtime Dorzolamide - timolol : One drop in each eye 2x daily [name] Drops : One drop in each eye at bedtime   Call to [facility] urology to request fills of alfuzosin be [facility]  Patient requested to have his script for the Ibuprofen 600 mg added to this order in vials. requested  Confirmed delivery date of 05/11/22, advised patient that pharmacy will contact them the morning of delivery.   Care Gaps: COVID Booster - Overdue BP- 164/96 04/08/22 AWV- 3/23  Star Rating Drugs: Irbesartan (Avapro) 75 mg - Last filled 04/07/22 30  [facility] Pitavastatin 2 mg - Last filled 04/07/22 [facility]   Patient Assistance: [name] for [place] Clinical Pharmacist Assistant [phone]

## 2022-04-29 NOTE — Chronic Care Management (AMB) (Signed)
A user error has taken place: encounter opened in error, closed for administrative reasons.

## 2022-05-01 NOTE — Chronic Care Management (AMB) (Signed)
Per PCP patient needs to make an appointment before he will take over prescription for Ibuprofen. Call to make patient aware he will need to schedule with PCP before this medication can be added to his order for delivery on the 16th, did not reach left voicemail with the above information and my contact info.   [place] Clinical Pharmacist Assistant [phone]

## 2022-05-05 ENCOUNTER — Encounter: Payer: Self-pay | Admitting: Internal Medicine

## 2022-05-11 ENCOUNTER — Other Ambulatory Visit (INDEPENDENT_AMBULATORY_CARE_PROVIDER_SITE_OTHER): Payer: Medicare Other

## 2022-05-11 DIAGNOSIS — Z23 Encounter for immunization: Secondary | ICD-10-CM

## 2022-05-12 DIAGNOSIS — M545 Low back pain, unspecified: Secondary | ICD-10-CM | POA: Diagnosis not present

## 2022-05-18 ENCOUNTER — Encounter: Payer: Self-pay | Admitting: Internal Medicine

## 2022-05-26 ENCOUNTER — Other Ambulatory Visit: Payer: Self-pay | Admitting: Family Medicine

## 2022-05-26 NOTE — Telephone Encounter (Signed)
Is this okay to refill? 

## 2022-05-28 ENCOUNTER — Other Ambulatory Visit: Payer: Self-pay | Admitting: Family Medicine

## 2022-05-28 ENCOUNTER — Other Ambulatory Visit: Payer: Self-pay | Admitting: Surgery

## 2022-05-29 ENCOUNTER — Telehealth: Payer: Self-pay | Admitting: Pharmacist

## 2022-05-29 NOTE — Chronic Care Management (AMB) (Unsigned)
Chronic Care Management Pharmacy Assistant   Name: [name]  MRN: [id number] DOB: September 22, 1950  Reason for Encounter: Medication Review/ Medication Coordination  Recent office visits:  None  Recent consult visits:  None  Hospital visits:  Medication Reconciliation was completed by comparing discharge summary, patient's EMR and Pharmacy list, and upon discussion with patient.   Patient presented to [facility] on 03/17/22 due to Ventral hernia without obstruction or gangrene. Patient was present for 6 hours   New?Medications Started at [facility] Discharge:?? -started  acetaminophen (TYLENOL) ibuprofen (ADVIL) oxyCODONE (Oxy IR/ROXICODONE)   Medication Changes at Hospital Discharge: -Changed  none   Medications Discontinued at Hospital Discharge: -Stopped  none   Medications that remain the same after Hospital Discharge:??  -All other medications will remain the same.        Medications: Outpatient Encounter Medications as of 05/29/2022  Medication Sig   citalopram (CELEXA) 20 MG tablet TAKE ONE TABLET BY MOUTH EVERYDAY AT BEDTIME   acetaminophen (TYLENOL) 500 MG tablet Take 2 tablets (1,000 mg total) by mouth every 6 (six) hours as needed for mild pain.   alfuzosin (UROXATRAL) 10 MG 24 hr tablet Take 10 mg by mouth daily.   carbidopa-levodopa (SINEMET IR) 25-100 MG tablet Take 1.5 tablets by mouth 3 (three) times daily.   donepezil (ARICEPT) 10 MG tablet Take 1 tablet by mouth at bedtime.   dorzolamide-timolol (COSOPT) 22.3-6.8 MG/ML ophthalmic solution Instill 1 drop into both eyes twice a day   FIBER ADULT GUMMIES PO Take 1 tablet by mouth 2 (two) times daily.   ibuprofen (ADVIL) 600 MG tablet Take 1 tablet (600 mg total) by mouth every 8 (eight) hours as needed for moderate pain.   irbesartan (AVAPRO) 75 MG tablet TAKE ONE TABLET BY MOUTH EVERY MORNING   latanoprost (XALATAN) 0.005 % ophthalmic solution Place 1 drop into the left eye  daily.   Multiple Vitamins-Minerals (MULTIVITAMIN ADULTS 50+ PO) Take 1 tablet by mouth daily.   nitroGLYCERIN (NITROSTAT) 0.4 MG SL tablet Place 1 tablet (0.4 mg total) under the tongue every 5 (five) minutes as needed for chest pain.   pantoprazole (PROTONIX) 40 MG tablet TAKE ONE TABLET BY MOUTH EVERY MORNING and TAKE ONE TABLET BY MOUTH EVERY EVENING   Pitavastatin Calcium 2 MG TABS Take every other day   timolol (TIMOPTIC) 0.5 % ophthalmic solution Instill 1 drop into both eyes every morning   trihexyphenidyl (ARTANE) 2 MG tablet Take 1/2 tablet (1 mg total) by mouth 2 (two) times daily with a meal.   verapamil (CALAN-SR) 120 MG CR tablet TAKE ONE TABLET BY MOUTH EVERY MORNING   No facility-administered encounter medications on file as of 05/29/2022.   Reviewed chart for medication changes ahead of medication coordination call.   BP Readings from Last 3 Encounters:  04/09/22 126/82  04/08/22 (!) 164/96  03/17/22 (!) 160/108    No results found for: "HGBA1C"   Patient obtains medications through Adherence Packaging  30 Days   Last adherence delivery included: Packs 30 DS Alfuzosin 10 Mg: Take one at Bedtime Verapamil (Calan-SR) 120 mg: Take one tab at breakfast Citalopram (Celexa) 20 mg: Take one tab at bedtime Trihexyphenidyl (Artane) 2 mg: Take half tab at breakfast and half at dinner Carbidopa - Levodopa (Sinemet) 25-100 mg: Take one and a half tabs before breakfast, lunch and dinner Pantoprazole (Protonix) 40 mg: Take one tab at breakfast and dinner Irbesartan (Avapro) 75 mg: Take one tab at breakfast Pitavastatin  Calcium (Livalo) 2 mg: Take one tab every other day at breakfast Donepezil (Aricept) 10 mg: Take one tab at bedtime Dorzolamide - timolol : One drop in each eye 2x daily [name] Drops : One drop in each eye at bedtime     Call to [facility] urology to request fills of alfuzosin [facility]   Patient requested to have his script for the  Ibuprofen 600 mg added to this order in vials. Requested per pcp appt needed 1st, pt aware   Confirmed delivery date of 05/11/22, advised patient that pharmacy will contact them the morning of delivery.   Patient is due for next adherence delivery on: 06/10/22. Called patient and reviewed medications and coordinated delivery. Packs 30 DS  This delivery to include: Alfuzosin 10 Mg: Take one at Bedtime Verapamil (Calan-SR) 120 mg: Take one tab at breakfast Citalopram (Celexa) 20 mg: Take one tab at bedtime Trihexyphenidyl (Artane) 2 mg: Take half tab at breakfast and half at dinner Carbidopa - Levodopa (Sinemet) 25-100 mg: Take one and a half tabs before breakfast, lunch and dinner Pantoprazole (Protonix) 40 mg: Take one tab at breakfast and dinner Irbesartan (Avapro) 75 mg: Take one tab at breakfast Pitavastatin Calcium (Livalo) 2 mg: Take one tab every other day at breakfast Donepezil (Aricept) 10 mg: Take one tab at bedtime Dorzolamide - timolol : One drop in each eye 2x daily [name] Drops : One drop in each eye at bedtime    Call to Specialist to request fills on pt's Advil was advised they only accept faxes for refills @ [phone] pharmacy advised.   Confirmed delivery date of 06/10/22, advised patient that pharmacy will contact them the morning of delivery.   Care Gaps: BP- 126/82 04/09/22 AWV- 3/23  Star Rating Drugs: Irbesartan (Avapro) 75 mg - Last filled 04/07/22 30  [facility] Pitavastatin 2 mg - Last filled 04/07/22 30 [facility]   Patient Assistance: [name] for [place] Clinical Pharmacist Assistant [phone]

## 2022-06-04 ENCOUNTER — Other Ambulatory Visit: Payer: Self-pay

## 2022-06-04 ENCOUNTER — Telehealth: Payer: Self-pay | Admitting: Surgery

## 2022-06-04 MED ORDER — IBUPROFEN 600 MG PO TABS: 600.0000 mg | ORAL_TABLET | Freq: Three times a day (TID) | ORAL | 1 refills | Status: DC | PRN

## 2022-06-04 NOTE — Telephone Encounter (Signed)
Incoming call from [facility], please call them back at [phone].  Request for patient's Ibuprofen 600 mg has been faxed twice. Our fax number has been confirmed.  Patient expecting delivery with his refill.  Thank you.

## 2022-06-04 NOTE — Telephone Encounter (Signed)
Patients Ibuprofen has been reordered.

## 2022-06-26 ENCOUNTER — Telehealth: Payer: Self-pay | Admitting: Pharmacist

## 2022-06-26 NOTE — Chronic Care Management (AMB) (Unsigned)
Chronic Care Management Pharmacy Assistant   Name: [name]  MRN: [id number] DOB: 1951-03-10  Reason for Encounter: Medication Review Medication Coordination    Recent office visits:  None   Recent consult visits:  None  Hospital visits:  Medication Reconciliation was completed by comparing discharge summary, patient's EMR and Pharmacy list, and upon discussion with patient.   Patient presented to [facility] on 03/17/22 due to Ventral hernia without obstruction or gangrene. Patient was present for 6 hours   New?Medications Started at [facility] Discharge:?? -started  acetaminophen (TYLENOL) ibuprofen (ADVIL) oxyCODONE (Oxy IR/ROXICODONE)   Medication Changes at Hospital Discharge: -Changed  none   Medications Discontinued at Hospital Discharge: -Stopped  none   Medications that remain the same after Hospital Discharge:??  -All other medications will remain the same.    Medications: Outpatient Encounter Medications as of 06/26/2022  Medication Sig   citalopram (CELEXA) 20 MG tablet TAKE ONE TABLET BY MOUTH EVERYDAY AT BEDTIME   acetaminophen (TYLENOL) 500 MG tablet Take 2 tablets (1,000 mg total) by mouth every 6 (six) hours as needed for mild pain.   alfuzosin (UROXATRAL) 10 MG 24 hr tablet Take 10 mg by mouth daily.   carbidopa-levodopa (SINEMET IR) 25-100 MG tablet Take 1.5 tablets by mouth 3 (three) times daily.   donepezil (ARICEPT) 10 MG tablet Take 1 tablet by mouth at bedtime.   dorzolamide-timolol (COSOPT) 22.3-6.8 MG/ML ophthalmic solution Instill 1 drop into both eyes twice a day   FIBER ADULT GUMMIES PO Take 1 tablet by mouth 2 (two) times daily.   ibuprofen (ADVIL) 600 MG tablet Take 1 tablet (600 mg total) by mouth every 8 (eight) hours as needed for moderate pain.   irbesartan (AVAPRO) 75 MG tablet TAKE ONE TABLET BY MOUTH EVERY MORNING   Multiple Vitamins-Minerals (MULTIVITAMIN ADULTS 50+ PO) Take 1 tablet by mouth daily.    nitroGLYCERIN (NITROSTAT) 0.4 MG SL tablet Place 1 tablet (0.4 mg total) under the tongue every 5 (five) minutes as needed for chest pain.   pantoprazole (PROTONIX) 40 MG tablet TAKE ONE TABLET BY MOUTH EVERY MORNING and TAKE ONE TABLET BY MOUTH EVERY EVENING   Pitavastatin Calcium 2 MG TABS Take every other day   timolol (TIMOPTIC) 0.5 % ophthalmic solution Instill 1 drop into both eyes every morning   trihexyphenidyl (ARTANE) 2 MG tablet Take 1/2 tablet (1 mg total) by mouth 2 (two) times daily with a meal.   verapamil (CALAN-SR) 120 MG CR tablet TAKE ONE TABLET BY MOUTH EVERY MORNING   No facility-administered encounter medications on file as of 06/26/2022.  Reviewed chart for medication changes ahead of medication coordination call.  BP Readings from Last 3 Encounters:  04/09/22 126/82  04/08/22 (!) 164/96  03/17/22 (!) 160/108    No results found for: "HGBA1C"   Patient obtains medications through Adherence Packaging  30 Days   Last adherence delivery included:   Alfuzosin 10 Mg: Take one at Bedtime Verapamil (Calan-SR) 120 mg: Take one tab at breakfast Citalopram (Celexa) 20 mg: Take one tab at bedtime Trihexyphenidyl (Artane) 2 mg: Take half tab at breakfast and half at dinner Carbidopa - Levodopa (Sinemet) 25-100 mg: Take one and a half tabs before breakfast, lunch and dinner Pantoprazole (Protonix) 40 mg: Take one tab at breakfast and dinner Irbesartan (Avapro) 75 mg: Take one tab at breakfast Pitavastatin Calcium (Livalo) 2 mg: Take one tab every other day at breakfast Donepezil (Aricept) 10 mg: Take one tab at  bedtime Dorzolamide - timolol : One drop in each eye 2x daily [name] Drops : One drop in each eye at bedtime     Call to Specialist to request fills on pt's Advil was advised they only accept faxes for refills @ [phone] pharmacy advised and pt aware we are waiting on it to be added to order     Confirmed delivery date of 06/10/22, advised  patient that pharmacy will contact them the morning of delivery.     Patient is due for next adherence delivery on: 07/09/22. Called patient and reviewed medications and coordinated delivery. Packs 30 DS  This delivery to include: Alfuzosin 10 Mg: Take one at Bedtime Verapamil (Calan-SR) 120 mg: Take one tab at breakfast Citalopram (Celexa) 20 mg: Take one tab at bedtime Trihexyphenidyl (Artane) 2 mg: Take half tab at breakfast and half at dinner Carbidopa - Levodopa (Sinemet) 25-100 mg: Take one and a half tabs before breakfast, lunch and dinner Pantoprazole (Protonix) 40 mg: Take one tab at breakfast and dinner Irbesartan (Avapro) 75 mg: Take one tab at breakfast Pitavastatin Calcium (Livalo) 2 mg: Take one tab every other day at breakfast Donepezil (Aricept) 10 mg: Take one tab at bedtime Dorzolamide - timolol : One drop in each eye 2x daily   Patient will need a short fill of (med), prior to adherence delivery. (To align with sync date or if PRN med)  Coordinated acute fill for (med) to be delivered (date).  Patient declined the following medications (meds) due to (reason)  Patient needs refills for ***.  Confirmed delivery date of 07/09/22, advised patient that pharmacy will contact them the morning of delivery.   Care Gaps: BP- 126/82 04/09/22 AWV- 10/09/21   Star Rating Drugs: Irbesartan (Avapro) 75 mg - Last filled 04/07/22 30  [facility] Pitavastatin 2 mg - Last filled 04/07/22 30 DS at [facility]   Patient Assistance: [name] for [place] Clinical Pharmacist Assistant [phone]

## 2022-07-02 ENCOUNTER — Other Ambulatory Visit: Payer: Self-pay | Admitting: Family Medicine

## 2022-07-09 DIAGNOSIS — H01024 Squamous blepharitis left upper eyelid: Secondary | ICD-10-CM | POA: Diagnosis not present

## 2022-07-09 DIAGNOSIS — Z961 Presence of intraocular lens: Secondary | ICD-10-CM | POA: Diagnosis not present

## 2022-07-09 DIAGNOSIS — H01021 Squamous blepharitis right upper eyelid: Secondary | ICD-10-CM | POA: Diagnosis not present

## 2022-07-09 DIAGNOSIS — H401132 Primary open-angle glaucoma, bilateral, moderate stage: Secondary | ICD-10-CM | POA: Diagnosis not present

## 2022-07-09 DIAGNOSIS — H538 Other visual disturbances: Secondary | ICD-10-CM | POA: Diagnosis not present

## 2022-07-09 DIAGNOSIS — H04123 Dry eye syndrome of bilateral lacrimal glands: Secondary | ICD-10-CM | POA: Diagnosis not present

## 2022-07-10 DIAGNOSIS — M545 Low back pain, unspecified: Secondary | ICD-10-CM | POA: Diagnosis not present

## 2022-07-10 DIAGNOSIS — M48061 Spinal stenosis, lumbar region without neurogenic claudication: Secondary | ICD-10-CM | POA: Diagnosis not present

## 2022-07-24 DIAGNOSIS — M545 Low back pain, unspecified: Secondary | ICD-10-CM | POA: Diagnosis not present

## 2022-07-24 DIAGNOSIS — M48061 Spinal stenosis, lumbar region without neurogenic claudication: Secondary | ICD-10-CM | POA: Diagnosis not present

## 2022-07-29 ENCOUNTER — Telehealth: Payer: Self-pay | Admitting: Pharmacist

## 2022-07-29 NOTE — Progress Notes (Signed)
Care Coordination Pharmacy Assistant   Name: [name]  MRN: [id number] DOB: 03-Nov-1950   Reason for Encounter: Medication Review Medication Coordination     Recent office visits:  None  Recent consult visits:  None  Hospital visits:  Medication Reconciliation was completed by comparing discharge summary, patient's EMR and Pharmacy list, and upon discussion with patient.   Patient presented to [facility] on 03/17/22 due to Ventral hernia without obstruction or gangrene. Patient was present for 6 hours   New?Medications Started at [facility] Discharge:?? -started  acetaminophen (TYLENOL) ibuprofen (ADVIL) oxyCODONE (Oxy IR/ROXICODONE)   Medication Changes at Hospital Discharge: -Changed  none   Medications Discontinued at Hospital Discharge: -Stopped  none   Medications that remain the same after Hospital Discharge:??  -All other medications will remain the same.      Medications: Outpatient Encounter Medications as of 07/29/2022  Medication Sig   acetaminophen (TYLENOL) 500 MG tablet Take 2 tablets (1,000 mg total) by mouth every 6 (six) hours as needed for mild pain.   alfuzosin (UROXATRAL) 10 MG 24 hr tablet Take 10 mg by mouth daily.   carbidopa-levodopa (SINEMET IR) 25-100 MG tablet Take 1.5 tablets by mouth 3 (three) times daily.   citalopram (CELEXA) 20 MG tablet TAKE ONE TABLET BY MOUTH EVERYDAY AT BEDTIME   donepezil (ARICEPT) 10 MG tablet Take 1 tablet by mouth at bedtime.   dorzolamide-timolol (COSOPT) 22.3-6.8 MG/ML ophthalmic solution Instill 1 drop into both eyes twice a day   FIBER ADULT GUMMIES PO Take 1 tablet by mouth 2 (two) times daily.   ibuprofen (ADVIL) 600 MG tablet Take 1 tablet (600 mg total) by mouth every 8 (eight) hours as needed for moderate pain.   irbesartan (AVAPRO) 75 MG tablet TAKE ONE TABLET BY MOUTH EVERY MORNING   Multiple Vitamins-Minerals (MULTIVITAMIN ADULTS 50+ PO) Take 1 tablet by mouth daily.    nitroGLYCERIN (NITROSTAT) 0.4 MG SL tablet Place 1 tablet (0.4 mg total) under the tongue every 5 (five) minutes as needed for chest pain.   pantoprazole (PROTONIX) 40 MG tablet TAKE ONE TABLET BY MOUTH EVERY MORNING and TAKE ONE TABLET BY MOUTH EVERY EVENING   Pitavastatin Calcium 2 MG TABS Take every other day   timolol (TIMOPTIC) 0.5 % ophthalmic solution Instill 1 drop into both eyes every morning   trihexyphenidyl (ARTANE) 2 MG tablet Take 1/2 tablet (1 mg total) by mouth 2 (two) times daily with a meal.   verapamil (CALAN-SR) 120 MG CR tablet TAKE ONE TABLET BY MOUTH EVERY MORNING   No facility-administered encounter medications on file as of 07/29/2022.  Reviewed chart for medication changes ahead of medication coordination call.   BP Readings from Last 3 Encounters:  04/09/22 126/82  04/08/22 (!) 164/96  03/17/22 (!) 160/108    No results found for: "HGBA1C"   Patient obtains medications through Adherence Packaging  30 Days   Last adherence delivery included:  Alfuzosin 10 Mg: Take one at Bedtime Verapamil (Calan-SR) 120 mg: Take one tab at breakfast Citalopram (Celexa) 20 mg: Take one tab at bedtime Trihexyphenidyl (Artane) 2 mg: Take half tab at breakfast and half at dinner Carbidopa - Levodopa (Sinemet) 25-100 mg: Take one and a half tabs before breakfast, lunch and dinner Pantoprazole (Protonix) 40 mg: Take one tab at breakfast and dinner Irbesartan (Avapro) 75 mg: Take one tab at breakfast Pitavastatin Calcium (Livalo) 2 mg: Take one tab every other day at breakfast Donepezil (Aricept) 10 mg: Take one tab at  bedtime Dorzolamide - timolol : One drop in each eye 2x daily    Patient is due for next adherence delivery on: 08/10/22. Called patient and reviewed medications and coordinated delivery. Packs 30 DS  This delivery to include: Alfuzosin 10 Mg: Take one at Bedtime Verapamil (Calan-SR) 120 mg: Take one tab at breakfast Citalopram (Celexa) 20 mg: Take one tab at  bedtime Trihexyphenidyl (Artane) 2 mg: Take half tab at breakfast and half at dinner Carbidopa - Levodopa (Sinemet) 25-100 mg: Take one and a half tabs before breakfast, lunch and dinner Pantoprazole (Protonix) 40 mg: Take one tab at breakfast and dinner Irbesartan (Avapro) 75 mg: Take one tab at breakfast Pitavastatin Calcium (Livalo) 2 mg: Take one tab every other day at breakfast Donepezil (Aricept) 10 mg: Take one tab at bedtime Dorzolamide - timolol : One drop in each eye 2x daily   Confirmed delivery date of 08/10/22, advised patient that pharmacy will contact them the morning of delivery.   Care Gaps: COVID Booster - Overdue AWV- 10/09/21 BP- 126/82 04/09/22  Star Rating Drugs: Irbesartan (Avapro) 75 mg - Last filled 07/06/22 30  DS at [facility] Pitavastatin 2 mg - Last filled 07/06/22 30 DS at [facility]

## 2022-08-03 DIAGNOSIS — M545 Low back pain, unspecified: Secondary | ICD-10-CM | POA: Diagnosis not present

## 2022-08-03 DIAGNOSIS — M48061 Spinal stenosis, lumbar region without neurogenic claudication: Secondary | ICD-10-CM | POA: Diagnosis not present

## 2022-08-10 DIAGNOSIS — M545 Low back pain, unspecified: Secondary | ICD-10-CM | POA: Diagnosis not present

## 2022-08-10 DIAGNOSIS — M48061 Spinal stenosis, lumbar region without neurogenic claudication: Secondary | ICD-10-CM | POA: Diagnosis not present

## 2022-08-13 DIAGNOSIS — M5459 Other low back pain: Secondary | ICD-10-CM | POA: Diagnosis not present

## 2022-08-21 DIAGNOSIS — M5451 Vertebrogenic low back pain: Secondary | ICD-10-CM | POA: Diagnosis not present

## 2022-08-24 DIAGNOSIS — M48061 Spinal stenosis, lumbar region without neurogenic claudication: Secondary | ICD-10-CM | POA: Diagnosis not present

## 2022-08-24 DIAGNOSIS — M545 Low back pain, unspecified: Secondary | ICD-10-CM | POA: Diagnosis not present

## 2022-08-27 ENCOUNTER — Telehealth: Payer: Self-pay

## 2022-08-27 MED ORDER — PANTOPRAZOLE SODIUM 40 MG PO TBEC: DELAYED_RELEASE_TABLET | ORAL | 0 refills | Status: DC

## 2022-08-27 NOTE — Telephone Encounter (Signed)
done

## 2022-08-27 NOTE — Progress Notes (Signed)
Patient ID: [name], male   DOB: [birth year], 73 y.o.   MRN: [id number] Care Management & Coordination Services Pharmacy Team  Reason for Encounter: Medication coordination and delivery  Contacted patient to discuss medications and coordinate delivery from [facility]. Spoke with patient on 08/28/2022  Cycle dispensing form sent to [facility] for review.   Last adherence delivery date:08/10/22      Patient is due for next adherence delivery on: 09/08/22  This delivery to include: Adherence Packaging  30 Days  Alfuzosin 10 Mg: Take one at Bedtime Verapamil (Calan-SR) 120 mg: Take one tab at breakfast Citalopram (Celexa) 20 mg: Take one tab at bedtime Trihexyphenidyl (Artane) 2 mg: Take half tab at breakfast and half at dinner Carbidopa - Levodopa (Sinemet) 25-100 mg: Take one and a half tabs before breakfast, lunch and dinner Pantoprazole (Protonix) 40 mg: Take one tab at breakfast and dinner Irbesartan (Avapro) 75 mg: Take one tab at breakfast Pitavastatin Calcium (Livalo) 2 mg: Take one tab every other day at breakfast Donepezil (Aricept) 10 mg: Take one tab at bedtime Latanoprost Eye Drops 0.01%: 1 Drop in each eye at bedtime   Dorzolamide - timolol : One drop in each eye 2x daily (Due in March pt made aware)   Refills requested from providers include: Pantoprazole, Theo Dills informed of request for refills from PCP  Delivery scheduled for 09/08/22. Unable to speak with patient to confirm date.    Any concerns about your medications? Yes Patient requested refill on his Ibuprofen to be sent to him prior to delivery. He reports he has 8 pills left and using 3 a day, he is asking they be sent to him on Monday 08/31/22. Call to Dr Mont Dutton office to request, per Dr's nurse provider is not willing to write for him as it was prescribed after surgery last year patient will need to contact PCP for further fills and prescribing. Forwarded information to [facility] to request for  patient. Patient made aware we are awaiting approval for prescription before it can be sent and that he may require an appointment prior.  How often do you forget or accidentally miss a dose? Never  Do you use a pillbox? No  Is patient in packaging Yes    Recent blood pressure readings are as follows: BP Readings from Last 3 Encounters:  04/09/22 126/82  04/08/22 (!) 164/96  03/17/22 (!) 160/108    Monday uprofen   Chart review: Recent office visits:  None  Recent consult visits:  None  Hospital visits:  Medication Reconciliation was completed by comparing discharge summary, patient's EMR and Pharmacy list, and upon discussion with patient.   Patient presented to [facility] on 03/17/22 due to Ventral hernia without obstruction or gangrene. Patient was present for 6 hours   New?Medications Started at [facility] Discharge:?? -started  acetaminophen (TYLENOL) ibuprofen (ADVIL) oxyCODONE (Oxy IR/ROXICODONE)   Medication Changes at Hospital Discharge: -Changed  none   Medications Discontinued at Hospital Discharge: -Stopped  none   Medications that remain the same after Hospital Discharge:??  -All other medications will remain the same  Medications: Outpatient Encounter Medications as of 08/27/2022  Medication Sig   acetaminophen (TYLENOL) 500 MG tablet Take 2 tablets (1,000 mg total) by mouth every 6 (six) hours as needed for mild pain.   alfuzosin (UROXATRAL) 10 MG 24 hr tablet Take 10 mg by mouth daily.   carbidopa-levodopa (SINEMET IR) 25-100 MG tablet Take 1.5 tablets by mouth 3 (three) times  daily.   citalopram (CELEXA) 20 MG tablet TAKE ONE TABLET BY MOUTH EVERYDAY AT BEDTIME   donepezil (ARICEPT) 10 MG tablet Take 1 tablet by mouth at bedtime.   dorzolamide-timolol (COSOPT) 22.3-6.8 MG/ML ophthalmic solution Instill 1 drop into both eyes twice a day   FIBER ADULT GUMMIES PO Take 1 tablet by mouth 2 (two) times daily.   ibuprofen (ADVIL) 600  MG tablet Take 1 tablet (600 mg total) by mouth every 8 (eight) hours as needed for moderate pain.   irbesartan (AVAPRO) 75 MG tablet TAKE ONE TABLET BY MOUTH EVERY MORNING   Multiple Vitamins-Minerals (MULTIVITAMIN ADULTS 50+ PO) Take 1 tablet by mouth daily.   nitroGLYCERIN (NITROSTAT) 0.4 MG SL tablet Place 1 tablet (0.4 mg total) under the tongue every 5 (five) minutes as needed for chest pain.   pantoprazole (PROTONIX) 40 MG tablet TAKE ONE TABLET BY MOUTH EVERY MORNING and TAKE ONE TABLET BY MOUTH EVERY EVENING   Pitavastatin Calcium 2 MG TABS Take every other day   timolol (TIMOPTIC) 0.5 % ophthalmic solution Instill 1 drop into both eyes every morning   trihexyphenidyl (ARTANE) 2 MG tablet Take 1/2 tablet (1 mg total) by mouth 2 (two) times daily with a meal.   verapamil (CALAN-SR) 120 MG CR tablet TAKE ONE TABLET BY MOUTH EVERY MORNING   No facility-administered encounter medications on file as of 08/27/2022.   BP Readings from Last 3 Encounters:  04/09/22 126/82  04/08/22 (!) 164/96  03/17/22 (!) 160/108    Pulse Readings from Last 3 Encounters:  04/09/22 (!) 57  04/08/22 (!) 57  03/17/22 62    No results found for: "HGBA1C" Lab Results  Component Value Date   CREATININE 1.20 10/09/2021   BUN 20 10/09/2021   GFRNONAA >60 06/30/2021   GFRAA 68 09/10/2020   NA 137 10/09/2021   K 4.5 10/09/2021   CALCIUM 9.1 10/09/2021   CO2 23 10/09/2021      [place] Clinical Pharmacist Assistant [phone]

## 2022-08-27 NOTE — Telephone Encounter (Signed)
-----  Message from Maren Reamer, [facility] sent at 08/27/2022  9:21 AM EST ----- Regarding: Medication Refill Request Pt needs refill for Pantroprazole '40mg'$  BID sent to [facility] if possible!  Thank you,  Maren Reamer Clinical Pharmacist [phone]

## 2022-08-28 ENCOUNTER — Telehealth: Payer: Self-pay | Admitting: Internal Medicine

## 2022-08-28 NOTE — Telephone Encounter (Signed)
-----   Message from Maren Reamer, [facility] sent at 08/28/2022  9:54 AM EST ----- Pt is requesting refill of Ibuprofen '600mg'$  three times daily (was previously prescribed by surgeon last year and now out of refills).  Pt would like for PCP to begin prescribing and is willing to come in for an appt if one is needed prior to prescribing.  Preferred Pharmacy: [facility] (currently on med sync delivery, next delivery scheduled for 2/5)  Thank you!  [place] Pharmacist [phone]

## 2022-08-28 NOTE — Telephone Encounter (Signed)
Pt declined appt and will get this from Dr. Nelva Bush since he will see him next week

## 2022-08-28 NOTE — Telephone Encounter (Signed)
Please advise 

## 2022-08-31 DIAGNOSIS — M48061 Spinal stenosis, lumbar region without neurogenic claudication: Secondary | ICD-10-CM | POA: Diagnosis not present

## 2022-08-31 DIAGNOSIS — M5416 Radiculopathy, lumbar region: Secondary | ICD-10-CM | POA: Diagnosis not present

## 2022-09-01 ENCOUNTER — Other Ambulatory Visit: Payer: Self-pay | Admitting: Surgery

## 2022-09-01 NOTE — Progress Notes (Cosign Needed)
Patient ID: [name], male   DOB: Sep 24, 1950, 72 y.o.   MRN: [id number] Per Patient call to [facility] and requested Scripts for Ibuprofen 600 mg and Tylenol #3 Prescriber Suella Broad.   [place] Clinical Pharmacist Assistant [phone]

## 2022-09-02 ENCOUNTER — Other Ambulatory Visit (HOSPITAL_COMMUNITY): Payer: Self-pay

## 2022-09-02 MED ORDER — ACETAMINOPHEN-CODEINE 300-30 MG PO TABS
1.0000 | ORAL_TABLET | Freq: Three times a day (TID) | ORAL | 0 refills | Status: DC
  Filled 2022-09-02: qty 15, 5d supply, fill #0

## 2022-09-02 MED ORDER — IBUPROFEN 600 MG PO TABS
600.0000 mg | ORAL_TABLET | Freq: Three times a day (TID) | ORAL | 2 refills | Status: DC | PRN
  Filled 2022-09-02: qty 90, 30d supply, fill #0

## 2022-09-03 ENCOUNTER — Other Ambulatory Visit (HOSPITAL_COMMUNITY): Payer: Self-pay

## 2022-09-03 ENCOUNTER — Telehealth: Payer: Self-pay | Admitting: Family Medicine

## 2022-09-03 MED ORDER — ACETAMINOPHEN-CODEINE 300-30 MG PO TABS: 1.0000 | ORAL_TABLET | Freq: Three times a day (TID) | ORAL | 0 refills | Status: DC

## 2022-09-03 NOTE — Telephone Encounter (Signed)
Received a call from [facility]. They are requesting refills on Tylenol #3 300-30 MG. Pharmacy can be reached at [phone].

## 2022-09-08 DIAGNOSIS — M5416 Radiculopathy, lumbar region: Secondary | ICD-10-CM | POA: Diagnosis not present

## 2022-09-15 DIAGNOSIS — M545 Low back pain, unspecified: Secondary | ICD-10-CM | POA: Diagnosis not present

## 2022-09-15 DIAGNOSIS — M48061 Spinal stenosis, lumbar region without neurogenic claudication: Secondary | ICD-10-CM | POA: Diagnosis not present

## 2022-09-22 DIAGNOSIS — M545 Low back pain, unspecified: Secondary | ICD-10-CM | POA: Diagnosis not present

## 2022-09-22 DIAGNOSIS — M48061 Spinal stenosis, lumbar region without neurogenic claudication: Secondary | ICD-10-CM | POA: Diagnosis not present

## 2022-09-24 ENCOUNTER — Other Ambulatory Visit: Payer: Self-pay | Admitting: Family Medicine

## 2022-09-24 ENCOUNTER — Other Ambulatory Visit: Payer: Self-pay | Admitting: Adult Health

## 2022-09-24 DIAGNOSIS — G3184 Mild cognitive impairment, so stated: Secondary | ICD-10-CM

## 2022-09-24 DIAGNOSIS — R251 Tremor, unspecified: Secondary | ICD-10-CM

## 2022-09-25 ENCOUNTER — Telehealth: Payer: Self-pay

## 2022-09-25 NOTE — Progress Notes (Signed)
Patient ID: [name], male   DOB: 06/28/1951, 72 y.o.   MRN: [id number]  Care Management & Coordination Services Pharmacy Team  Reason for Encounter: Medication coordination and delivery  Contacted patient to discuss medications and coordinate delivery from [facility]. Spoke with patient on 09/25/2022  Cycle dispensing form sent to [facility] for review.   Last adherence delivery date:09/08/22      Patient is due for next adherence delivery on: 10/07/22  This delivery to include: Adherence Packaging  30 Days  Alfuzosin 10 Mg: Take one at Bedtime Verapamil (Calan-SR) 120 mg: Take one tab at breakfast Citalopram (Celexa) 20 mg: Take one tab at bedtime Trihexyphenidyl (Artane) 2 mg: Take half tab at breakfast and half at dinner Carbidopa - Levodopa (Sinemet) 25-100 mg: Take one and a half tabs before breakfast, lunch and dinner Pantoprazole (Protonix) 40 mg: Take one tab at breakfast and dinner Irbesartan (Avapro) 75 mg: Take one tab at breakfast Pitavastatin Calcium (Livalo) 2 mg: Take one tab every other day at breakfast Donepezil (Aricept) 10 mg: Take one tab at bedtime Latanoprost Eye Drops 0.01%: 1 Drop in each eye at bedtime  Patient declined the following medications this month: None  Patient requested Dorzolamide eye drops also be added to this order.   Confirmed delivery date of 10/07/22, advised patient that pharmacy will contact them the morning of delivery.   Any concerns about your medications? No  How often do you forget or accidentally miss a dose? Never  Do you use a pillbox? No  Is patient in packaging Yes      Chart review: Recent office visits:  None  Recent consult visits:  None  Hospital visits:  None in previous 6 months  Medications: Outpatient Encounter Medications as of 09/25/2022  Medication Sig   acetaminophen (TYLENOL) 500 MG tablet Take 2 tablets (1,000 mg total) by mouth every 6 (six) hours as needed for mild pain.    acetaminophen-codeine (TYLENOL #3) 300-30 MG tablet Take 1 tablet by mouth 3 times daily for 5 days   alfuzosin (UROXATRAL) 10 MG 24 hr tablet Take 10 mg by mouth daily.   carbidopa-levodopa (SINEMET IR) 25-100 MG tablet Take 1.5 tablets by mouth 3 (three) times daily.   citalopram (CELEXA) 20 MG tablet TAKE ONE TABLET BY MOUTH EVERYDAY AT BEDTIME   donepezil (ARICEPT) 10 MG tablet TAKE ONE TABLET BY MOUTH EVERYDAY AT BEDTIME   dorzolamide-timolol (COSOPT) 22.3-6.8 MG/ML ophthalmic solution Instill 1 drop into both eyes twice a day   FIBER ADULT GUMMIES PO Take 1 tablet by mouth 2 (two) times daily.   ibuprofen (ADVIL) 600 MG tablet Take 1 tablet (600 mg total) by mouth every 8 (eight) hours as needed for moderate pain.   ibuprofen (ADVIL) 600 MG tablet Take 1 tablet by mouth 3 times daily as needed.   irbesartan (AVAPRO) 75 MG tablet TAKE ONE TABLET BY MOUTH EVERY MORNING   Multiple Vitamins-Minerals (MULTIVITAMIN ADULTS 50+ PO) Take 1 tablet by mouth daily.   nitroGLYCERIN (NITROSTAT) 0.4 MG SL tablet Place 1 tablet (0.4 mg total) under the tongue every 5 (five) minutes as needed for chest pain.   pantoprazole (PROTONIX) 40 MG tablet TAKE ONE TABLET BY MOUTH EVERY MORNING and TAKE ONE TABLET BY MOUTH EVERY EVENING   Pitavastatin Calcium (LIVALO) 2 MG TABS TAKE ONE TABLET BY MOUTH EVERY OTHER DAY   timolol (TIMOPTIC) 0.5 % ophthalmic solution Instill 1 drop into both eyes every morning   trihexyphenidyl (ARTANE) 2  MG tablet TAKE 1/2 TABLET BY MOUTH TWICE DAILY WITH A MEAL   verapamil (CALAN-SR) 120 MG CR tablet TAKE ONE TABLET BY MOUTH EVERY MORNING   No facility-administered encounter medications on file as of 09/25/2022.   BP Readings from Last 3 Encounters:  04/09/22 126/82  04/08/22 (!) 164/96  03/17/22 (!) 160/108    Pulse Readings from Last 3 Encounters:  04/09/22 (!) 57  04/08/22 (!) 57  03/17/22 62    No results found for: "HGBA1C" Lab Results  Component Value Date    CREATININE 1.20 10/09/2021   BUN 20 10/09/2021   GFRNONAA >60 06/30/2021   GFRAA 68 09/10/2020   NA 137 10/09/2021   K 4.5 10/09/2021   CALCIUM 9.1 10/09/2021   CO2 23 10/09/2021      [place] Clinical Pharmacist Assistant [phone]

## 2022-09-29 DIAGNOSIS — H538 Other visual disturbances: Secondary | ICD-10-CM | POA: Diagnosis not present

## 2022-10-05 DIAGNOSIS — M48061 Spinal stenosis, lumbar region without neurogenic claudication: Secondary | ICD-10-CM | POA: Diagnosis not present

## 2022-10-05 DIAGNOSIS — M5416 Radiculopathy, lumbar region: Secondary | ICD-10-CM | POA: Diagnosis not present

## 2022-10-08 ENCOUNTER — Telehealth: Payer: Self-pay | Admitting: Family Medicine

## 2022-10-08 NOTE — Telephone Encounter (Signed)
Contacted KIMI KARSTENS to schedule their annual wellness visit. Appointment made for 10/13/22. [facility] [employer] direct phone # [phone]

## 2022-10-08 NOTE — Progress Notes (Signed)
[facility] PATIENT: [name] DOB: Nov 28, 1950   REASON FOR VISIT: Follow-up for tremor mild cognitive impairment, depression HISTORY FROM: Patient and wife   Chief complaint: Routine follow-up visit    HISTORY OF PRESENT ILLNESS:HISTORY:63 year African American male who since last year and a half has noticed increasing tremors mainly in the left arm and leg and to a lesser degree in the right arm as well. He states the tremors were quite mild but became more pronounced after a minor accident while staying [place] the [place] in [place]. He fell down 12 flights of steps and hit a concrete slab and had a concussion. He and some minor bruises and knee injury which took several months to reck of her period CT scan of the head was done 2 days later which was unremarkable. Since then he has had some walking difficulty but this may be related to his knee pain. He says that his feet do at times gets stuck in his started walking with a stooped posture. He however does not describe typical festination. He has resting and intermittent action tremor claiming the left arm and leg the tremor does improve with activities. The tremor does not seem to interfere with most of his routine. He denies significant bradykinesia, and drooling of saliva or micrographia. He has been evaluated by neurologists Dr. Reginia Forts at [facility] neurology but he is unable to tell me for the diagnosis was. He was not told that this may be Parkinson's and was not tried on dopaminergic medications. He did have an MRI scan and some lab work but I do not have those results for my review available today. Patient is here today for a second opinion as he feels his tremor is not getting better. He does admit to some light masonry hallucinations as well as restless sleep thrashing of his legs. He denies significant memory or cognitive difficulties. He has not noticed any particular effect of alcohol on  his tremor. Is no family history of tremors. Patient does have chronic hepatitis C and is planning to start treatment with dapsone. He has had no seizures, significant head injury with major loss of consciousness, stroke, TIAs or significant neurological problems.  Update 08/14/2014 : He returns for follow-up after last visit 3 months ago. He feels his tremors are about the same. He had tried reducing the dose of Sinemet but noticed that his tremors got worse and hence he went back up to the original dose which is 25/100 one tablet 3 times daily. He still feels occasionally confused and at times secondary to some cells but he admits that he worries a lot. He also admits to feeling depressed, getting tired easily and not having initiated. He has not been on any medications for depression. Patient denies significant drooling of saliva, bradykinesia, gait or balance problems. He feels his tremor is mild and does not interfere with his activities of daily living. I discussed alternative treatment options including addition of dopamine agonist, anticholinergic or even consideration for deep brain  stimulation since his tremor is predominantly unilateral however the patient does not want to consider more aggressive treatment options at the present time. UPDATE 12/26/14 [name], 72 year old black male returns for follow-up. He was last seen by Dr. [name] 08/14/2014. He feels his tremors are better since increasing his carbidopa levodopa to 1-1/2 tablets 3 times daily. He is tolerating it well without dizziness, sleepiness, nightmares or hallucinations A MRI Brain with and without Contrast completed on  07/29/2011 was normal.  The Mini-Mental status exam today is unchanged 29 out of 30. He did not follow-up with his primary care for  complaints of depression. He denies any significant drooling bradykinesia falls or balance problems. His tremor does not interfere with his activities of daily living. He returns for  reevaluation Update 07/03/2015 : He returns for follow-up after last visit 6 months ago. He states his tremors continue to do well and he seems to have responded quite well to increased dose of Sinemet 25/100 1-1/2 tablet 3 times daily. Is tolerating it well without any dizziness, sleepiness, nightmares, hallucinations, nausea or diarrhea. He continues to have mild memory difficulties but his has not been on any treatment for depression and in fact has not seen his primary care physician for the same for quite some time. Update 08/11/2016 PS : He returns for follow-up after last visit year ago. He states his tremors as well as memory loss or more or less unchanged. Continues to have intermittent tremors in the left hand mainly. The tremors fluctuate he has good days and bad days. He is currently taking Sinemet 25/100 one and half tablet 3 times daily. He complains of mild fatigue and daytime sleepiness. He denies hallucinations, delusions, dizziness or upset stomach. He does not want to try higher dose because of fear of side effects. Continues to have short-term memory difficulties. He has not been quite compulsive about taking notes and using memory compensation strategies but plans to do so. He has no new complaints today. UPDATE 07/19/2018CM [name], 72 year old male returns for follow-up with tremors and mild cognitive impairment which he says is stable. He continues to have a resting tremor in the left hand which is intermittent. He continues to have good and bad days. He remains on Sinemet 25 100 (1 and half tablets) 3 times daily. He does little exercise he is not doing any memory compensation strategies at this time and was encouraged to do so. He denies any falls. He denies any hallucinations or increased confusion. He denies any freezing spells. He does complain of left hip pain He returns for reevaluation UPDATE 1/22/2019CM [name], 62 he denies 18-year-old male returns for follow-up with a history  of Parkinson's disease.  He continues to have mild resting tremor in the left hand which is intermittent.  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  He remains on Sinemet 3 times a day however he is taking his medication with a meal.  He complains of sometimes being lightheaded when he stands up too quickly.  He was encouraged to sit on the side of the bed and then get up slightly.  He was also encouraged to stay well-hydrated.  He has not been exercising.  He denies any hallucinations or increased confusion.  He has not had any freezing spells.  He returns for reevaluation 02/15/18 UPDATE:JV  Patient returns today for six-month follow-up.  He continues to take Sinemet 3 times daily with the first dose around 6 AM (1 hour prior to eating breakfast), second dose 11 AM (1 hour prior to eating lunch) and then third dose between 5:30 and 6 PM (eats dinner around 6:30-7 PM).  Patient denies any increase in symptoms between doses but does state if he is late on a dose, he will start having increased tremors in his left upper extremity and left lower extremity.  He continues to complain of short-term memory issue and is unable to state whether it is worsening but he  states he notices it more.  He did obtain a large book full avoid games but does this approximately 1 time per week.  Recommended to do these activities at least once daily.  Patient also has complaints of feeling as though he overanalyzes certain situations and feeling increased fatigue.  Patient does endorse increased stress recently but does feel somewhat situational.  Provided patient with stress relaxation techniques and advised him if this continues, to follow-up with PCP for possible medication management.  He denies any hallucinations or increased confusion.  He denies any freezing spells. UPDATE 1/29/2020CM [name], 72 year old male returns for follow-up with a history of Parkinson's disease and mild cognitive impairment.  He denies any  freezing spells any stiffness any falls.  He denies any difficulty swallowing.  He has spinal stenosis and had recent injection.  He is seeing Dr. Sharol Given for a foot problem.  He is currently on Sinemet 3 times a day before meals.  He has not been exercising and was encouraged to do so.  He has not had any hallucinations or increased confusion.  Noted very intermittent left tremor of the hand.  Patient reports some anxiety in public.  He was encouraged to perform stress relaxation techniques.  He returns for reevaluation  : Update 02/23/2019 :  he returns for follow-up after last visit 6 months ago.  He states his tremors are doing well until a month ago when he is noticed worsening of his tremor both at rest as well as using his hands.  He remains on Sinemet 25/100 mg 1-1/2 tablet 3 times daily.  Is tolerating them well without any nausea, diarrhea, dizziness, sleepiness or hallucinations.  He states his short-term memory difficulties are unchanged.  He has not been socializing a lot or being active but plans to do that soon.  He has no other complaints.  He denies significant drooling, bradykinesia, stooped posture gait or balance problems Update 09/04/2019 : He returns for follow-up after last visit 6 months ago.  He states that his tremors continue to intermittently not do well.  He has no more bad days than good days.  Has noticed some tremor in his legs as well intermittently.  Patient was advised to try higher dose of Sinemet 2 tablets 3 times daily at last visit.  He states he was not able to tolerate this because of dizziness and had to cut back to 1-1/2 3 times daily which is tolerating better.  He is also noticed that his balance is off but he blames this on having some problems in his toes.  He has had no major falls or injuries.  He can still get up easily from his chair.  He denies significant drooling of saliva.  He does have some at times disturbed sleep and reviewed dreams and talks in his sleep.  Gets  startled easily. Update 01/10/2020 : He returns for follow-up after last visit 4 months ago.  Is accompanied by his wife.  The patient was started on Azilect at last visit but had trouble tolerating it daily due to sleepiness, tiredness and dizziness.  He has since switched to using Azilect 0.5 mg twice daily every other day and seems to be tolerating it better.  However his tremors still persists and appear to get worse mostly when he is anxious or with severe exertion.  He feels the tremor are not particularly disabling and he can handle it.  Continues to have poor short-term memory and needs to write things down  but still remembers only occasionally.  Sometimes he may remember later.  Wife notes that occasionally he may pause in midsentence and searches for words.  At times even starts stuttering as he is trying to find the word.  He has no family history of Alzheimer's or dementia.  Still fairly independent and manages most activities for himself.  He remains currently on Sinemet 25/100 1.5 tablets 4 times daily which is tolerating well without significant nightmares, hallucinations, daytime sleepiness or nausea. Update 06/28/2020 : He returns for follow-up after last visit 6 months ago.  He is accompanied by his wife.  He continues to have intermittent tremors mostly in the left hand which are bothersome and he has good days and bad days.  Patient still continues to take Artane 2 mg tablets only half a tablet twice daily on alternate days and not every day as instructed at last visit.  He remains on Sinemet 25/100 mg 1.5 tablets 3 times daily and he has had trouble tolerating higher doses in the past.  Is not having significant dizziness or upset stomach but is still having some disturbed sleep with crazy dreams.  He denies hallucinations or delusions.  He is careful with his walking and has had a few near falls but no major falls or injuries.  He denies excessive drooling of saliva or bradykinesia or mobility  issues.  He does admit to decreased short-term memory, attention and concentration difficulties but these appear to be stable and unchanged. Update 10/01/2020 Dr. [name]: He returns for follow-up after last visit 3 months ago.  Patient did not tolerate increasing the dose of Artane to 2 mg daily as he developed increased dryness of mouth as well as bad dreams.  He has since reduced the dose back to 1 mg daily and is feeling better.  He still has intermittent resting tremor in both left hand as well as leg which does interfere with his activities of daily living slightly.  He remains on Sinemet 25/100 mg 1.5 tablets 3 times daily which is tolerating quite well without any dizziness, sleepiness, upset stomach, nightmares or hallucinations.  He notices occasional lip twitchings in the morning only.  He has no bradykinesia or increased drooling.  He can get up easily and to most of his activities without help.  He continues to have short-term memory problems which appear unchanged.  He forgets recent appointments and events.  He states he has decreased hearing bilaterally which is also seems to complicate his understanding and memory.  He is however quite independent in activities of daily living.  Update 04/07/2021 JM: Returns for 66-month follow-up after prior visit with Dr. [name].  He is accompanied by his wife, [name]. Since prior visit, was hopitalized back in June for GI bleed.  He developed acute confusion most likely hospital delirium requiring short course of Precedex. Per wife, she is concerned regarding worsening cognition with short term memory and delayed recall since that time as well as multiple other concerns including worsening lower back pain, decreased hearing and vision and chills/coldness sensation in both feet and hands. Tremors have been stable with ongoing use of Sinemet and Artane.  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 Continues to struggle with short-term memory and recall which he feels has continued to gradually, slowly decline. Examples he gives includes walking into a room and forget why he went into room or repeat similar questions. Did recently  receive hearing aide for left ear and continued use of hearing aide in right ear. At times he will feel like he is in a "fog" typically associated with lightheadedness, is aware these are happening, does not lose consciousness. Per wife, this has been improving as she has been trying to encourage him to drink more water.  Will feel down or depressed due to his memory issues but otherwise denies depression. Does do memory exercises occasionally at home.  Is currently doing PT after back surgery - does report great improvement of pain since procedure. Did have post anesthesia delirium requiring short-term use of Seroquel but this has since resolved. Wife is concerned as he needs to have surgical procedure for umbilical hernia and fears recurrence of delirium. No behavioral concerns such as hallucinations, agitation, sleep difficulty, or confusion.  Remains on Cerefolin but does not believe it has been that beneficial. MMSE today 27/30 (prior 26/30)  Tremors stable with ongoing use of Sinemet and Artane, tolerating without side effects. Denies any issues with drooling, freezing, gait impairement, swallowing difficulties or bradykinesia.  No further concerns at this time   Update 04/09/2022 JM: Patient returns for 29-month follow-up.  Cognition: Has been stable since prior visit He has remained on Aricept 10 mg nightly, denies side effects Difficulties with short term memory, can forget certain tasks he is supposed to be doing, at times will be able to recall but sometime next will need to ask wife to remind him.  He does endorse some underlying anxiety, can have difficulty making decisions on his own such as what he should be wearing that day.   Parkinsonian  tremor: Remains on sinmet, occasionally misses dosage, currently prescribed taking 3 times per day, tries to remember to take at 6:30am, 12pm and 5:30-6pm. Unsure if he is having issues with wearing off but wife has told him symptoms can worsen prior to next dosage Also remains on Artane half tablet twice daily Can have slowness of movements and gait at times, does not use assistive device, denies any recent falls   Update 10/12/2022 JM: Patient returns for 70-month follow-up accompanied by his wife.  Reports gradual cognitive decline over the past 6 months more so with short term memory (recall). Wife also mentions issues with hearing and vision, at times will forget to put in hearing aides which she believes has been contributing some. Wife notes increased difficulty with focus and follow through with tasks which has gradually worsened since prior visit but patient admits to mind racing/wandering and then can easily forget what he is supposed to be doing. MMSE today 23/30 (prior 27/30 1 yr ago). Continues on Aricept 10 mg nightly. Wife questions other medications to help with memory.   Remains on Sinemet 1.5 tab TID and Artane 1/2 tab BID for parkinsonian tremor, will miss Sinemet dose occasionally - typically takes at 6:45am, 12:30-1:30pm,6-7pm, avoids taking with meals.  Tremor has been relatively stable but can fluctuate. Some more issues with gait/balance but also has chronic right foot issue that has been gradually worsening as well as gradually worsening low back pain, is waiting to be seen by neurosurgeon Dr. Ronnald Ramp for further input. This has been affecting quality of life as he is not able to be as active as he wants to be. Doing aquatic therapy which has been beneficial especially in regards to energy levels.     REVIEW OF SYSTEMS: Full 14 system review of systems performed and notable only for those listed in HPI only and  all other systems negative   ALLERGIES: Allergies  Allergen  Reactions   Anesthesia S-I-40 [Propofol] Other (See Comments)   Other Other (See Comments)    Severe delirium with anesthesia per spouse enhances parkinsons symptoms    HOME MEDICATIONS: Outpatient Medications Prior to Visit  Medication Sig Dispense Refill   acetaminophen (TYLENOL) 500 MG tablet Take 2 tablets (1,000 mg total) by mouth every 6 (six) hours as needed for mild pain.     acetaminophen-codeine (TYLENOL #3) 300-30 MG tablet Take 1 tablet by mouth 3 times daily for 5 days 15 tablet 0   alfuzosin (UROXATRAL) 10 MG 24 hr tablet Take 10 mg by mouth daily.     carbidopa-levodopa (SINEMET IR) 25-100 MG tablet Take 1.5 tablets by mouth 3 (three) times daily. 405 tablet 3   citalopram (CELEXA) 20 MG tablet TAKE ONE TABLET BY MOUTH EVERYDAY AT BEDTIME 90 tablet 1   donepezil (ARICEPT) 10 MG tablet TAKE ONE TABLET BY MOUTH EVERYDAY AT BEDTIME 30 tablet 5   dorzolamide-timolol (COSOPT) 22.3-6.8 MG/ML ophthalmic solution Instill 1 drop into both eyes twice a day 10 mL 11   FIBER ADULT GUMMIES PO Take 1 tablet by mouth 2 (two) times daily.     ibuprofen (ADVIL) 600 MG tablet Take 1 tablet (600 mg total) by mouth every 8 (eight) hours as needed for moderate pain. 60 tablet 1   ibuprofen (ADVIL) 600 MG tablet Take 1 tablet by mouth 3 times daily as needed. 90 tablet 2   irbesartan (AVAPRO) 75 MG tablet TAKE ONE TABLET BY MOUTH EVERY MORNING 90 tablet 0   Multiple Vitamins-Minerals (MULTIVITAMIN ADULTS 50+ PO) Take 1 tablet by mouth daily.     nitroGLYCERIN (NITROSTAT) 0.4 MG SL tablet Place 1 tablet (0.4 mg total) under the tongue every 5 (five) minutes as needed for chest pain. 25 tablet 3   pantoprazole (PROTONIX) 40 MG tablet TAKE ONE TABLET BY MOUTH EVERY MORNING and TAKE ONE TABLET BY MOUTH EVERY EVENING 180 tablet 0   Pitavastatin Calcium (LIVALO) 2 MG TABS TAKE ONE TABLET BY MOUTH EVERY OTHER DAY 45 tablet 0   timolol (TIMOPTIC) 0.5 % ophthalmic solution Instill 1 drop into both eyes every  morning 30 mL 4   trihexyphenidyl (ARTANE) 2 MG tablet TAKE 1/2 TABLET BY MOUTH TWICE DAILY WITH A MEAL 30 tablet 5   verapamil (CALAN-SR) 120 MG CR tablet TAKE ONE TABLET BY MOUTH EVERY MORNING 90 tablet 0   No facility-administered medications prior to visit.    PAST MEDICAL HISTORY: Past Medical History:  Diagnosis Date   Allergy    seasonal   Arthritis    BPH (benign prostatic hyperplasia)    Complication of anesthesia    Pt. stated he had a reaction that ended in him requiring urinary cath placement; hx delirium worsening parkinson symptoms   Diverticulosis    GERD (gastroesophageal reflux disease)    esophageal spasms   Glaucoma    Gout    Head injury, closed, with concussion    Hepatitis C    chronic - Has been treated with Harvoni   HLD (hyperlipidemia)    statin intolerant (Crestor & Simvastatin) - Taking Livalo 1mg  / week   Hypertension    Parkinson's disease    Plantar fasciitis    right   PVD (peripheral vascular disease) ([place])    With no claudication; only mild abdominal aortic atherosclerosis noted on ultrasound.   Spinal stenosis of lumbar region    Thoracic ascending  aortic aneurysm ([place])    4.2 cm ascending TAA 09/2016 CT, 1 yr f/u rec   Ulcer     PAST SURGICAL HISTORY: Past Surgical History:  Procedure Laterality Date   BUNIONECTOMY WITH WEIL OSTEOTOMY Right 11/02/2019   Procedure: Right Foot Lapidus, Modified McBride Bunionectomy,  Hallux Akin Osteotomy;  Surgeon: Wylene Simmer, MD;  Location: [facility];  Service: Orthopedics;  Laterality: Right;   CARDIAC CATHETERIZATION  2005   30% Cx. Dr. Melvern Banker   cataract surgery Left 11/05/2014   COLONOSCOPY     COLONOSCOPY N/A 01/09/2021   Procedure: COLONOSCOPY;  Surgeon: Ronnette Juniper, MD;  Location: [facility];  Service: Gastroenterology;  Laterality: N/A;   ESOPHAGOGASTRODUODENOSCOPY N/A 05/02/2015   Procedure: ESOPHAGOGASTRODUODENOSCOPY (EGD);  Surgeon: Ronald Lobo, MD;  Location: [facility];  Service: Endoscopy;  Laterality: N/A;   ESOPHAGOGASTRODUODENOSCOPY  05/02/2015   no source of pt chest pain endoscopically evident. small hiatal hernia.   ESOPHAGOGASTRODUODENOSCOPY (EGD) WITH PROPOFOL N/A 01/02/2021   Procedure: ESOPHAGOGASTRODUODENOSCOPY (EGD) WITH PROPOFOL;  Surgeon: Clarene Essex, MD;  Location: [facility];  Service: Endoscopy;  Laterality: N/A;   EYE SURGERY     HARDWARE REMOVAL Right 11/02/2019   Procedure: Second Metatarsal Removal of Deep Implant and Rotational Osteotomy;  Surgeon: Wylene Simmer, MD;  Location: [place];  Service: Orthopedics;  Laterality: Right;   IR ANGIOGRAM SELECTIVE EACH ADDITIONAL VESSEL  01/04/2021   IR ANGIOGRAM SELECTIVE EACH ADDITIONAL VESSEL  01/04/2021   IR ANGIOGRAM VISCERAL SELECTIVE  01/04/2021   IR US GUIDE VASC ACCESS RIGHT  01/04/2021   LUMBAR LAMINECTOMY/DECOMPRESSION MICRODISCECTOMY N/A 07/03/2021   Procedure: Lumbar three through five decompression with lumbar three through five insitu fusion;  Surgeon: Melina Schools, MD;  Location: [place];  Service: Orthopedics;  Laterality: N/A;   MEMBRANE PEEL Left 03/14/2014   Procedure: MEMBRANE PEEL; ENDOLASER;  Surgeon: Hurman Horn, MD;  Location: [place];  Service: Ophthalmology;  Laterality: Left;   NM MYOVIEW LTD  10/2015   LOW RISK. Small, fixed basal lateral defect - likely diaphragmatic attenuation. 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  No R WMA.  GRII DD-moderately dilated LA..  Mildly dilated RV but normal function.  Normal RAP/CVP.  Trivial AI with mild to moderate sclerosis-no stenosis   UMBILICAL HERNIA REPAIR  2023   UPPER GASTROINTESTINAL ENDOSCOPY     WEIL OSTEOTOMY Right  09/01/2017   Procedure: RIGHT GREAT TOE CHEVRON AND WEIL OSTEOTOMY 2ND METATARSAL;  Surgeon: Newt Minion, MD;  Location: [place];  Service: Orthopedics;  Laterality: Right;   XI ROBOTIC ASSISTED VENTRAL HERNIA N/A 03/17/2022   Procedure: XI ROBOTIC ASSISTED VENTRAL HERNIA;  Surgeon: [name], MD;  Location: [facility];  Service: General;  Laterality: N/A;    FAMILY HISTORY: Family History  Problem Relation Age of Onset   Heart disease Mother    Cancer Paternal Grandfather    Cancer Sister     SOCIAL HISTORY: Social History   Socioeconomic History   Marital status: Married    Spouse name: barbra gen   Number of children: 3   Years of education: AS   Highest education level: Not on file  Occupational  History   Occupation: self employed    Fish farm manager: [employer]    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  Tobacco Use   Smoking status: Former    Types: Cigars    Quit date: 07/27/1988    Years since quitting: 34.2   Smokeless tobacco: Never  Vaping Use   Vaping Use: Never used  Substance and Sexual Activity   Alcohol use: Yes    Alcohol/week: 3.0 standard drinks of alcohol    Types: 1 Glasses of wine, 1 Cans of beer, 1 Shots of liquor per week    Comment: occasional   Drug use: No   Sexual activity: Yes  Other Topics Concern   Not on file  Social History Narrative   Lives with wife    Right handed   Drinks 1-2 cups caffeine daily   Social Determinants of Health   Financial Resource Strain: Low Risk  (07/24/2021)   Overall Financial Resource Strain (CARDIA)    Difficulty of Paying Living Expenses: Not very hard  Food Insecurity: Not on file  Transportation Needs: No Transportation Needs (07/24/2021)   PRAPARE - Hydrologist (Medical): No    Lack of Transportation (Non-Medical): No  Physical Activity: Not on file  Stress: Not on file  Social Connections: Not on file  Intimate Partner Violence: Not on file     PHYSICAL EXAM  Vitals:    10/12/22 1057  BP: 115/71  Pulse: 78  Weight: 152 lb 12.8 oz (69.3 kg)  Height: 5\' 10"  (1.778 m)     Body mass index is 21.92 kg/m.  Generalized: Very pleasant elderly African-American male in no acute distress   Head: normocephalic and atraumatic  Musculoskeletal: No deformity  Neurological examination  Mentation: Awake and alert.  Fluent speech and language.    10/12/2022   10:59 AM 10/06/2021    2:34 PM 04/07/2021    2:38 PM  MMSE - Mini Mental State Exam  Orientation to time 4 5 5   Orientation to Place 5 5 5   Registration 3 3 3   Attention/ Calculation 1 5 5   Recall 2 2 1   Language- name 2 objects 1 2 2   Language- repeat 1 1 1   Language- follow 3 step command 3 1 2   Language- read & follow direction 1 1 1   Write a sentence 1 1 1   Copy design 1 1 0  Total score 23 27 26    Cranial nerve II-XII: Pupils were equal round reactive to light extraocular movements were full, visual field were full on confrontational test. Facial sensation and strength were normal.  Hearing aids bilaterally.  Uvula tongue midline. head turning and shoulder shrug were normal and symmetric.Tongue protrusion into cheek strength was normal. Motor: normal bulk and tone, full strength in the BUE, BLE, .  Diminished amplitude of finger tapping on the left, intermittent left hand and leg resting tremor, mild cogwheel rigidity upon activation at the left wrist. Tremor improves with action and intention.  No bradykinesia bilaterally. Unable to appreciate tremor in RUE  Sensory: normal and symmetric to light touch, on the face arms and legs  Coordination: finger-nose-finger, heel-to-shin bilaterally, no dysmetria Reflexes: 1+ upper lower and symmetric plantar responses were flexor bilaterally. Gait and Station: Rising up from seated position without assistance, no festination or stooped posture.  Ambulated well with moderate stride and mild unsteadiness, decreased arm swing on the left., smooth turning, no  assistive device.     DIAGNOSTIC DATA (LABS, IMAGING, TESTING) - I  reviewed patient records, labs, notes, testing and imaging myself where available.  Lab Results  Component Value Date   WBC 5.1 03/17/2022   HGB 11.8 (L) 03/17/2022   HCT 36.4 (L) 03/17/2022   MCV 87.1 03/17/2022   PLT 218 03/17/2022      Component Value Date/Time   NA 137 10/09/2021 1328   K 4.5 10/09/2021 1328   CL 101 10/09/2021 1328   CO2 23 10/09/2021 1328   GLUCOSE 88 10/09/2021 1328   GLUCOSE 92 06/30/2021 1430   BUN 20 10/09/2021 1328   CREATININE 1.20 10/09/2021 1328   CREATININE 1.06 06/29/2014 0920   CALCIUM 9.1 10/09/2021 1328   PROT 7.1 10/09/2021 1328   ALBUMIN 4.5 10/09/2021 1328   AST 18 10/09/2021 1328   ALT 11 10/09/2021 1328   ALKPHOS 73 10/09/2021 1328   BILITOT 0.3 10/09/2021 1328   GFRNONAA >60 06/30/2021 1430   GFRAA 68 09/10/2020 1058   Lab Results  Component Value Date   CHOL 165 10/09/2021   HDL 58 10/09/2021   LDLCALC 98 10/09/2021   TRIG 41 10/09/2021   CHOLHDL 2.8 10/09/2021   Lab Results  Component Value Date   TSH 3.131 01/11/2021     ASSESSMENT AND PLAN 72 y.o. year old male  has a medical history of resting left arm and leg tremors with mild features of early left sided Parkinson's disease initially present in 2014 which has been relatively stable. He also complains of mild cognitive impairment    1.  Parkinsonian tremor -tremor stable, some issues with gait gradually worsening but also having more issues with chronic right foot and lower back pain. Is awaiting visit with neurosurgery for further evaluation, also followed by orthopedics. If these issues resolve and balance/gait still an issue, can consider trial of increased Sinemet dosage to see if this further helps gait -discussed importance of taking Sinemet 1.5 tab TID as prescribed, discussed way to help prevent missed dosages -Continue Artane at current dosage -Declines interest in deep brain stimulation     2.  MCI -gradual decline -MMSE 23/30 (prior 27/30) -Recommend switching Aricept to Exelon 1.5 mg twice daily -Discussed increasing physical activity as tolerated and routinely doing memory exercises at home as well as routine socialization     Follow-up in 6 months or call earlier if needed     CC:  Denita Lung, MD   I spent 34 minutes of face-to-face and non-face-to-face time with patient and wife.  This included previsit chart review, lab review, study review, order entry, electronic health record documentation, patient education and discussion regarding diagnoses and treatment plan and answered all the questions to patient and wife satisfaction  Frann Rider, AGNP-BC  [facility] [address] [place], [place] [address]  Phone [phone] Fax [phone] Note: This document was prepared with digital dictation and possible smart phrase technology. Any transcriptional errors that result from this process are unintentional.

## 2022-10-12 ENCOUNTER — Encounter: Payer: Self-pay | Admitting: Adult Health

## 2022-10-12 ENCOUNTER — Ambulatory Visit: Payer: Medicare Other | Admitting: Adult Health

## 2022-10-12 VITALS — BP 115/71 | HR 78 | Ht 70.0 in | Wt 152.8 lb

## 2022-10-12 DIAGNOSIS — G20C Parkinsonism, unspecified: Secondary | ICD-10-CM

## 2022-10-12 DIAGNOSIS — G3184 Mild cognitive impairment, so stated: Secondary | ICD-10-CM | POA: Diagnosis not present

## 2022-10-12 MED ORDER — RIVASTIGMINE TARTRATE 1.5 MG PO CAPS: 1.5000 mg | ORAL_CAPSULE | Freq: Two times a day (BID) | ORAL | 11 refills | Status: DC

## 2022-10-12 NOTE — Patient Instructions (Addendum)
Your Plan:  Continue Sinemet 1 tab 3 times daily and Artane 1/2 tab twice daily  Recommend starting Exelon 1.5mg  twice daily for memory  Stop Aricept (donepezil) at this point      Follow up in 6 months or call earlier if needed       Thank you for coming to see [place] at [facility]. I hope we have been able to provide you high quality care today.  You may receive a patient satisfaction survey over the next few weeks. We would appreciate your feedback and comments so that we may continue to improve ourselves and the health of our patients.

## 2022-10-12 NOTE — Progress Notes (Cosign Needed)
[name] ID # [id number] renewed for 2024 for [place] with End Date of 10/07/2023  Card # [id number] Bin [id number] PCN [id number] [employer] [facility] [id number]  Forwarded to [facility] to apply to pts account.   [place] Clinical Pharmacist Assistant [phone]

## 2022-10-13 ENCOUNTER — Ambulatory Visit (INDEPENDENT_AMBULATORY_CARE_PROVIDER_SITE_OTHER): Payer: Medicare Other

## 2022-10-13 VITALS — Ht 70.0 in | Wt 155.0 lb

## 2022-10-13 DIAGNOSIS — Z0000 Encounter for general adult medical examination without abnormal findings: Secondary | ICD-10-CM | POA: Diagnosis not present

## 2022-10-13 NOTE — Progress Notes (Signed)
I connected with  [name] on 10/13/22 by a audio enabled telemedicine application and verified that I am speaking with the correct person using two identifiers. Spouse was also on call.  Patient Location: Home  Provider Location: Office/Clinic  I discussed the limitations of evaluation and management by telemedicine. The patient expressed understanding and agreed to proceed.  Subjective:   [name] is a 72 y.o. male who presents for Medicare Annual/Subsequent preventive examination.  Review of Systems     Cardiac Risk Factors include: advanced age (>55men, >64 women);dyslipidemia;hypertension;male gender     Objective:    Today's Vitals   10/13/22 1146 10/13/22 1147  Weight: 155 lb (70.3 kg)   Height: 5\' 10"  (1.778 m)   PainSc:  3    Body mass index is 22.24 kg/m.     10/13/2022   11:53 AM 03/17/2022    6:35 AM 02/17/2022    3:29 PM 10/09/2021   11:19 AM 07/03/2021   11:18 AM 06/30/2021    2:01 PM 01/02/2021    3:46 PM  Advanced Directives  Does Patient Have a Medical Advance Directive? Yes Yes Yes Yes No No No  Type of Paramedic of [place];Living will [place];Living will [place];Living will [place]     Does patient want to make changes to medical advance directive?   No - Patient declined Yes (ED - Information included in AVS)     Copy of [place] in Chart? Yes - validated most recent copy scanned in chart (See row information) No - copy requested No - copy requested Yes - validated most recent copy scanned in chart (See row information)     Would patient like information on creating a medical advance directive?     No - Patient declined No - Patient declined No - Patient declined    Current Medications (verified) Outpatient Encounter Medications as of 10/13/2022  Medication Sig   acetaminophen (TYLENOL) 500 MG tablet Take 2 tablets (1,000 mg total) by  mouth every 6 (six) hours as needed for mild pain.   alfuzosin (UROXATRAL) 10 MG 24 hr tablet Take 10 mg by mouth daily.   carbidopa-levodopa (SINEMET IR) 25-100 MG tablet Take 1.5 tablets by mouth 3 (three) times daily.   citalopram (CELEXA) 20 MG tablet TAKE ONE TABLET BY MOUTH EVERYDAY AT BEDTIME   dorzolamide-timolol (COSOPT) 22.3-6.8 MG/ML ophthalmic solution Instill 1 drop into both eyes twice a day   FIBER ADULT GUMMIES PO Take 1 tablet by mouth 2 (two) times daily.   ibuprofen (ADVIL) 600 MG tablet Take 1 tablet (600 mg total) by mouth every 8 (eight) hours as needed for moderate pain.   irbesartan (AVAPRO) 75 MG tablet TAKE ONE TABLET BY MOUTH EVERY MORNING   Multiple Vitamins-Minerals (MULTIVITAMIN ADULTS 50+ PO) Take 1 tablet by mouth daily.   nitroGLYCERIN (NITROSTAT) 0.4 MG SL tablet Place 1 tablet (0.4 mg total) under the tongue every 5 (five) minutes as needed for chest pain.   pantoprazole (PROTONIX) 40 MG tablet TAKE ONE TABLET BY MOUTH EVERY MORNING and TAKE ONE TABLET BY MOUTH EVERY EVENING   Pitavastatin Calcium (LIVALO) 2 MG TABS TAKE ONE TABLET BY MOUTH EVERY OTHER DAY   rivastigmine (EXELON) 1.5 MG capsule Take 1 capsule (1.5 mg total) by mouth 2 (two) times daily.   timolol (TIMOPTIC) 0.5 % ophthalmic solution Instill 1 drop into both eyes every morning   trihexyphenidyl (ARTANE)  2 MG tablet TAKE 1/2 TABLET BY MOUTH TWICE DAILY WITH A MEAL   verapamil (CALAN-SR) 120 MG CR tablet TAKE ONE TABLET BY MOUTH EVERY MORNING   acetaminophen-codeine (TYLENOL #3) 300-30 MG tablet Take 1 tablet by mouth 3 times daily for 5 days (Patient not taking: Reported on 10/13/2022)   ibuprofen (ADVIL) 600 MG tablet Take 1 tablet by mouth 3 times daily as needed.   No facility-administered encounter medications on file as of 10/13/2022.    Allergies (verified) Anesthesia [id number] [propofol] and Other   History: Past Medical History:  Diagnosis Date   Allergy    seasonal   Arthritis     BPH (benign prostatic hyperplasia)    Complication of anesthesia    Pt. stated he had a reaction that ended in him requiring urinary cath placement; hx delirium worsening parkinson symptoms   Diverticulosis    GERD (gastroesophageal reflux disease)    esophageal spasms   Glaucoma    Gout    Head injury, closed, with concussion    Hepatitis C    chronic - Has been treated with Harvoni   HLD (hyperlipidemia)    statin intolerant (Crestor & Simvastatin) - Taking Livalo 1mg  / week   Hypertension    Parkinson's disease    Plantar fasciitis    right   PVD (peripheral vascular disease) ([place])    With no claudication; only mild abdominal aortic atherosclerosis noted on ultrasound.   Spinal stenosis of lumbar region    Thoracic ascending aortic aneurysm (HCC)    4.2 cm ascending TAA 09/2016 CT, 1 yr f/u rec   Ulcer    Past Surgical History:  Procedure Laterality Date   BUNIONECTOMY WITH WEIL OSTEOTOMY Right 11/02/2019   Procedure: Right Foot Lapidus, Modified McBride Bunionectomy,  Hallux Akin Osteotomy;  Surgeon: Wylene Simmer, MD;  Location: [facility];  Service: Orthopedics;  Laterality: Right;   CARDIAC CATHETERIZATION  2005   30% Cx. Dr. Melvern Banker   cataract surgery Left 11/05/2014   COLONOSCOPY     COLONOSCOPY N/A 01/09/2021   Procedure: COLONOSCOPY;  Surgeon: Ronnette Juniper, MD;  Location: [facility];  Service: Gastroenterology;  Laterality: N/A;   ESOPHAGOGASTRODUODENOSCOPY N/A 05/02/2015   Procedure: ESOPHAGOGASTRODUODENOSCOPY (EGD);  Surgeon: Ronald Lobo, MD;  Location: [facility];  Service: Endoscopy;  Laterality: N/A;   ESOPHAGOGASTRODUODENOSCOPY  05/02/2015   no source of pt chest pain endoscopically evident. small hiatal hernia.   ESOPHAGOGASTRODUODENOSCOPY (EGD) WITH PROPOFOL N/A 01/02/2021   Procedure: ESOPHAGOGASTRODUODENOSCOPY (EGD) WITH PROPOFOL;  Surgeon: Clarene Essex, MD;  [facility];  Service: Endoscopy;  Laterality: N/A;   EYE SURGERY      HARDWARE REMOVAL Right 11/02/2019   Procedure: Second Metatarsal Removal of Deep Implant and Rotational Osteotomy;  Surgeon: Wylene Simmer, MD;  Location: [place] [place];  Service: Orthopedics;  Laterality: Right;   IR ANGIOGRAM SELECTIVE EACH ADDITIONAL VESSEL  01/04/2021   IR ANGIOGRAM SELECTIVE EACH ADDITIONAL VESSEL  01/04/2021   IR ANGIOGRAM VISCERAL SELECTIVE  01/04/2021   IR US GUIDE VASC ACCESS RIGHT  01/04/2021   LUMBAR LAMINECTOMY/DECOMPRESSION MICRODISCECTOMY N/A 07/03/2021   Procedure: Lumbar three through five decompression with lumbar three through five insitu fusion;  Surgeon: Melina Schools, MD;  Location: [facility];  Service: Orthopedics;  Laterality: N/A;   MEMBRANE PEEL Left 03/14/2014   Procedure: MEMBRANE PEEL; ENDOLASER;  Surgeon: Hurman Horn, MD;  Location: [facility];  Service: Ophthalmology;  Laterality: Left;   NM MYOVIEW LTD  10/2015  LOW RISK. Small, fixed basal lateral defect - likely diaphragmatic attenuation. 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  No R WMA.  GRII DD-moderately dilated LA..  Mildly dilated RV but normal function.  Normal RAP/CVP.  Trivial AI with mild to moderate sclerosis-no stenosis   UMBILICAL HERNIA REPAIR  2023   UPPER GASTROINTESTINAL ENDOSCOPY     WEIL OSTEOTOMY Right 09/01/2017   Procedure: RIGHT GREAT TOE CHEVRON AND WEIL OSTEOTOMY 2ND METATARSAL;  Surgeon: Newt Minion, MD;  Location: [facility];  Service: Orthopedics;  Laterality: Right;   XI ROBOTIC ASSISTED VENTRAL HERNIA N/A 03/17/2022   Procedure: XI ROBOTIC ASSISTED VENTRAL HERNIA;  Surgeon: [name], MD;  Location: [facility];  Service: General;  Laterality: N/A;   Family History  Problem Relation Age of  Onset   Heart disease Mother    Cancer Paternal Grandfather    Cancer Sister    Social History   Socioeconomic History   Marital status: Married    Spouse name: barbra gen   Number of children: 3   Years of education: AS   Highest education level: Not on file  Occupational History   Occupation: self employed    Employer: [employer]    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  Tobacco Use   Smoking status: Former    Types: Cigars    Quit date: 07/27/1988    Years since quitting: 34.2   Smokeless tobacco: Never  Vaping Use   Vaping Use: Never used  Substance and Sexual Activity   Alcohol use: Yes    Alcohol/week: 3.0 standard drinks of alcohol    Types: 1 Glasses of wine, 1 Cans of beer, 1 Shots of liquor per week    Comment: occasional   Drug use: No   Sexual activity: Yes  Other Topics Concern   Not on file  Social History Narrative   Lives with wife    Right handed   Drinks 1-2 cups caffeine daily   Social Determinants of Health   Financial Resource Strain: Low Risk  (10/13/2022)   Overall Financial Resource Strain (CARDIA)    Difficulty of Paying Living Expenses: Not hard at all  Food Insecurity: No Food Insecurity (10/13/2022)   Hunger Vital Sign    Worried About Running Out of Food in the Last Year: Never true    Ran Out of Food in the Last Year: Never true  Transportation Needs: No Transportation Needs (10/13/2022)   PRAPARE - Hydrologist (Medical): No    Lack of Transportation (Non-Medical): No  Physical Activity: Insufficiently Active (10/13/2022)   Exercise Vital Sign    Days of Exercise per Week: 2 days    Minutes of Exercise per Session: 60 min  Stress: No Stress Concern Present (10/13/2022)   [place]    Feeling of Stress : Only a little  Social Connections: Not on file    Tobacco Counseling Counseling given: Not Answered   Clinical Intake:  Pre-visit preparation  completed: Yes  Pain : 0-10 Pain Score: 3  Pain Type: Chronic pain Pain Location: Back Pain Orientation: Lower Pain Descriptors / Indicators: Aching Pain Onset:  More than a month ago Pain Frequency: Constant     Nutritional Status: BMI of 19-24  Normal Nutritional Risks: None Diabetes: No  How often do you need to have someone help you when you read instructions, pamphlets, or other written materials from your doctor or pharmacy?: 1 - Never  Diabetic? no  Interpreter Needed?: No  Information entered by :: NAllen LPN   Activities of Daily Living    10/13/2022   11:55 AM 02/17/2022    3:35 PM  In your present state of health, do you have any difficulty performing the following activities:  Hearing? 1   Comment has hearing aids   Vision? 1   Difficulty concentrating or making decisions? 0   Walking or climbing stairs? 0   Dressing or bathing? 0   Doing errands, shopping? 0 0  Preparing Food and eating ? N   Using the Toilet? N   In the past six months, have you accidently leaked urine? N   Do you have problems with loss of bowel control? N   Managing your Medications? N   Managing your Finances? N   Housekeeping or managing your Housekeeping? N     Patient Care Team: Denita Lung, MD as PCP - General (Family Medicine) [name], MD as PCP - Cardiology (Cardiology) Viona Gilmore, [facility] (Inactive) as Pharmacist (Pharmacist)  Indicate any recent Medical Services you may have received from other than [facility] providers in the past year (date may be approximate).     Assessment:   This is a routine wellness examination for Joon.  Hearing/Vision screen Vision Screening - Comments:: Regular eye exams, [facility]  Dietary issues and exercise activities discussed: Current Exercise Habits: Structured exercise class, Type of exercise: Other - see comments (water therapy), Time (Minutes): 60, Frequency (Times/Week): 2, Weekly Exercise (Minutes/Week):  120   Goals Addressed             This Visit's Progress    Patient Stated       10/13/2022, wants to be able to move better and gain strength all over       Depression Screen    10/13/2022   11:54 AM 10/09/2021   11:21 AM 09/10/2020    9:26 AM 03/20/2020    2:46 PM 01/17/2020   10:00 AM 12/06/2018   11:14 AM 01/28/2017    1:32 PM  PHQ 2/9 Scores  PHQ - 2 Score 0 1 0 0 1 2 0  PHQ- 9 Score 0     7     Fall Risk    10/13/2022   11:54 AM 04/09/2022    1:54 PM 04/08/2022    2:42 PM 10/09/2021   11:20 AM 09/10/2020    9:24 AM  Fall Risk   Falls in the past year? 0 0 0 1 0  Number falls in past yr: 0   0 0  Injury with Fall? 0   0 0  Risk for fall due to : Impaired balance/gait;Medication side effect;Impaired mobility   No Fall Risks No Fall Risks  Follow up Falls prevention discussed;Education provided;Falls evaluation completed   Falls evaluation completed Falls evaluation completed    FALL RISK PREVENTION PERTAINING TO THE HOME:  Any stairs in or around the home? Yes  If so, are there any without handrails? No  Home free of loose throw rugs in walkways, pet beds, electrical cords, etc? Yes  Adequate lighting in your home to reduce risk of falls?  Yes   ASSISTIVE DEVICES UTILIZED TO PREVENT FALLS:  Life alert? No  Use of a cane, walker or w/c? No  Grab bars in the bathroom? Yes  Shower chair or bench in shower? Yes  Elevated toilet seat or a handicapped toilet? Yes   TIMED UP AND GO:  Was the test performed? No .      Cognitive Function:    10/12/2022   10:59 AM 10/06/2021    2:34 PM 04/07/2021    2:38 PM 08/24/2018    9:25 AM 05/11/2014   11:14 AM  MMSE - Mini Mental State Exam  Orientation to time 4 5 5 5 5   Orientation to Place 5 5 5 5 5   Registration 3 3 3 3 3   Attention/ Calculation 1 5 5 5 5   Recall 2 2 1 2 2   Language- name 2 objects 1 2 2 2 2   Language- repeat 1 1 1 1 1   Language- follow 3 step command 3 1 2 3 3   Language- read & follow direction 1  1 1 1 1   Write a sentence 1 1 1 1 1   Write a sentence-comments    no subject   Copy design 1 1 0 1 1  Total score 23 27 26 29 29         10/13/2022   11:56 AM  6CIT Screen  What Year? 0 points  What month? 0 points  What time? 0 points  Count back from 20 0 points  Months in reverse 0 points  Repeat phrase 0 points  Total Score 0 points    Immunizations Immunization History  Administered Date(s) Administered   DTaP 09/10/1997, 08/11/2007   Fluad Quad(high Dose 65+) 05/17/2019, 05/01/2021, 04/06/2022   Influenza Split 05/09/2012, 06/09/2015, 07/11/2015   Influenza Whole 07/10/2004, 05/25/2006   Influenza, High Dose Seasonal PF 05/05/2017, 04/15/2018   Influenza,inj,Quad PF,6+ Mos 04/28/2013, 04/26/2014, 05/31/2016   Influenza-Unspecified 05/25/2016   Moderna SARS-COV2 Booster Vaccination 11/07/2020   Moderna Sars-Covid-2 Vaccination 08/20/2019, 09/17/2019, 05/22/2020   PFIZER Comirnaty(Gray Top)Covid-19 Tri-Sucrose Vaccine 05/11/2022   Pfizer Covid-19 Vaccine Bivalent Booster 90yrs & up 05/01/2021   Pneumococcal Conjugate-13 09/07/2010   Pneumococcal Polysaccharide-23 07/16/2017   Tdap 09/05/2011, 10/09/2021   Zoster Recombinat (Shingrix) 01/17/2020, 03/20/2020    TDAP status: Up to date  Flu Vaccine status: Up to date  Pneumococcal vaccine status: Up to date  Covid-19 vaccine status: Completed vaccines  Qualifies for Shingles Vaccine? Yes   Zostavax completed Yes   Shingrix Completed?: Yes  Screening Tests Health Maintenance  Topic Date Due   COVID-19 Vaccine (6 - 2023-24 season) 07/06/2022   Medicare Annual Wellness (AWV)  10/10/2022   COLONOSCOPY (Pts 45-69yrs Insurance coverage will need to be confirmed)  01/10/2031   DTaP/Tdap/Td (5 - Td or Tdap) 10/10/2031   Pneumonia Vaccine 55+ Years old  Completed   INFLUENZA VACCINE  Completed   Hepatitis C Screening  Completed   Zoster Vaccines- Shingrix  Completed   HPV VACCINES  Aged Out    Health  Maintenance  Health Maintenance Due  Topic Date Due   COVID-19 Vaccine (6 - 2023-24 season) 07/06/2022   Medicare Annual Wellness (AWV)  10/10/2022    Colorectal cancer screening: Type of screening: Colonoscopy. Completed 01/09/2021. Repeat every 10 years  Lung Cancer Screening: (Low Dose CT Chest recommended if Age 103-80 years, 30 pack-year currently smoking OR have quit w/in 15years.) does not qualify.   Lung Cancer Screening Referral: no  Additional Screening:  Hepatitis  C Screening: does qualify; Completed 01/25/2014  Vision Screening: Recommended annual ophthalmology exams for early detection of glaucoma and other disorders of the eye. Is the patient up to date with their annual eye exam?  Yes  Who is the provider or what is the name of the office in which the patient attends annual eye exams? [facility] If pt is not established with a provider, would they like to be referred to a provider to establish care? No .   Dental Screening: Recommended annual dental exams for proper oral hygiene  Community Resource Referral / Chronic Care Management: CRR required this visit?  No   CCM required this visit?  No      Plan:     I have personally reviewed and noted the following in the patient's chart:   Medical and social history Use of alcohol, tobacco or illicit drugs  Current medications and supplements including opioid prescriptions. Patient is not currently taking opioid prescriptions. Functional ability and status Nutritional status Physical activity Advanced directives List of other physicians Hospitalizations, surgeries, and ER visits in previous 12 months Vitals Screenings to include cognitive, depression, and falls Referrals and appointments  In addition, I have reviewed and discussed with patient certain preventive protocols, quality metrics, and best practice recommendations. A written personalized care plan for preventive services as well as general preventive  health recommendations were provided to patient.     Kellie Simmering, LPN   [id number]   Nurse Notes: none  Due to this being a virtual visit, the after visit summary with patients personalized plan was offered to patient via mail or my-chart.  Patient would like to access on my-chart

## 2022-10-13 NOTE — Patient Instructions (Signed)
[name] , Thank you for taking time to come for your Medicare Wellness Visit. I appreciate your ongoing commitment to your health goals. Please review the following plan we discussed and let me know if I can assist you in the future.   These are the goals we discussed:  Goals      Patient Stated     10/13/2022, wants to be able to move better and gain strength all over     Track and Manage My Blood Pressure-Hypertension     Timeframe:  Long-Range Goal Priority:  Medium Start Date:                             Expected End Date:                       Follow Up Date 10/22/21    - check blood pressure weekly - choose a place to take my blood pressure (home, clinic or office, retail store) - write blood pressure results in a log or diary    Why is this important?   You won't feel high blood pressure, but it can still hurt your blood vessels.  High blood pressure can cause heart or kidney problems. It can also cause a stroke.  Making lifestyle changes like losing a little weight or eating less salt will help.  Checking your blood pressure at home and at different times of the day can help to control blood pressure.  If the doctor prescribes medicine remember to take it the way the doctor ordered.  Call the office if you cannot afford the medicine or if there are questions about it.     Notes:         This is a list of the screening recommended for you and due dates:  Health Maintenance  Topic Date Due   COVID-19 Vaccine (6 - 2023-24 season) 07/06/2022   Medicare Annual Wellness Visit  10/13/2023   Colon Cancer Screening  01/10/2031   DTaP/Tdap/Td vaccine (5 - Td or Tdap) 10/10/2031   Pneumonia Vaccine  Completed   Flu Shot  Completed   Hepatitis C Screening: USPSTF Recommendation to screen - Ages 18-79 yo.  Completed   Zoster (Shingles) Vaccine  Completed   HPV Vaccine  Aged Out    Advanced directives: copy in chart  Conditions/risks identified: none  Next appointment:  Follow up in one year for your annual wellness visit.   Preventive Care 77 Years and Older, Male  Preventive care refers to lifestyle choices and visits with your health care provider that can promote health and wellness. What does preventive care include? A yearly physical exam. This is also called an annual well check. Dental exams once or twice a year. Routine eye exams. Ask your health care provider how often you should have your eyes checked. Personal lifestyle choices, including: Daily care of your teeth and gums. Regular physical activity. Eating a healthy diet. Avoiding tobacco and drug use. Limiting alcohol use. Practicing safe sex. Taking low doses of aspirin every day. Taking vitamin and mineral supplements as recommended by your health care provider. What happens during an annual well check? The services and screenings done by your health care provider during your annual well check will depend on your age, overall health, lifestyle risk factors, and family history of disease. Counseling  Your health care provider may ask you questions about your: Alcohol use. Tobacco use.  Drug use. Emotional well-being. Home and relationship well-being. Sexual activity. Eating habits. History of falls. Memory and ability to understand (cognition). Work and work Statistician. Screening  You may have the following tests or measurements: Height, weight, and BMI. Blood pressure. Lipid and cholesterol levels. These may be checked every 5 years, or more frequently if you are over 24 years old. Skin check. Lung cancer screening. You may have this screening every year starting at age 46 if you have a 30-pack-year history of smoking and currently smoke or have quit within the past 15 years. Fecal occult blood test (FOBT) of the stool. You may have this test every year starting at age 46. Flexible sigmoidoscopy or colonoscopy. You may have a sigmoidoscopy every 5 years or a colonoscopy every  10 years starting at age 58. Prostate cancer screening. Recommendations will vary depending on your family history and other risks. Hepatitis C blood test. Hepatitis B blood test. Sexually transmitted disease (STD) testing. Diabetes screening. This is done by checking your blood sugar (glucose) after you have not eaten for a while (fasting). You may have this done every 1-3 years. Abdominal aortic aneurysm (AAA) screening. You may need this if you are a current or former smoker. Osteoporosis. You may be screened starting at age 4 if you are at high risk. Talk with your health care provider about your test results, treatment options, and if necessary, the need for more tests. Vaccines  Your health care provider may recommend certain vaccines, such as: Influenza vaccine. This is recommended every year. Tetanus, diphtheria, and acellular pertussis (Tdap, Td) vaccine. You may need a Td booster every 10 years. Zoster vaccine. You may need this after age 91. Pneumococcal 13-valent conjugate (PCV13) vaccine. One dose is recommended after age 7. Pneumococcal polysaccharide (PPSV23) vaccine. One dose is recommended after age 39. Talk to your health care provider about which screenings and vaccines you need and how often you need them. This information is not intended to replace advice given to you by your health care provider. Make sure you discuss any questions you have with your health care provider. Document Released: 08/09/2015 Document Revised: 04/01/2016 Document Reviewed: 05/14/2015 Elsevier Interactive Patient Education  2017 [employer] Prevention in the Home Falls can cause injuries. They can happen to people of all ages. There are many things you can do to make your home safe and to help prevent falls. What can I do on the outside of my home? Regularly fix the edges of walkways and driveways and fix any cracks. Remove anything that might make you trip as you walk through a door,  such as a raised step or threshold. Trim any bushes or trees on the path to your home. Use bright outdoor lighting. Clear any walking paths of anything that might make someone trip, such as rocks or tools. Regularly check to see if handrails are loose or broken. Make sure that both sides of any steps have handrails. Any raised decks and porches should have guardrails on the edges. Have any leaves, snow, or ice cleared regularly. Use sand or salt on walking paths during winter. Clean up any spills in your garage right away. This includes oil or grease spills. What can I do in the bathroom? Use night lights. Install grab bars by the toilet and in the tub and shower. Do not use towel bars as grab bars. Use non-skid mats or decals in the tub or shower. If you need to sit down in the shower,  use a plastic, non-slip stool. Keep the floor dry. Clean up any water that spills on the floor as soon as it happens. Remove soap buildup in the tub or shower regularly. Attach bath mats securely with double-sided non-slip rug tape. Do not have throw rugs and other things on the floor that can make you trip. What can I do in the bedroom? Use night lights. Make sure that you have a light by your bed that is easy to reach. Do not use any sheets or blankets that are too big for your bed. They should not hang down onto the floor. Have a firm chair that has side arms. You can use this for support while you get dressed. Do not have throw rugs and other things on the floor that can make you trip. What can I do in the kitchen? Clean up any spills right away. Avoid walking on wet floors. Keep items that you use a lot in easy-to-reach places. If you need to reach something above you, use a strong step stool that has a grab bar. Keep electrical cords out of the way. Do not use floor polish or wax that makes floors slippery. If you must use wax, use non-skid floor wax. Do not have throw rugs and other things on the  floor that can make you trip. What can I do with my stairs? Do not leave any items on the stairs. Make sure that there are handrails on both sides of the stairs and use them. Fix handrails that are broken or loose. Make sure that handrails are as long as the stairways. Check any carpeting to make sure that it is firmly attached to the stairs. Fix any carpet that is loose or worn. Avoid having throw rugs at the top or bottom of the stairs. If you do have throw rugs, attach them to the floor with carpet tape. Make sure that you have a light switch at the top of the stairs and the bottom of the stairs. If you do not have them, ask someone to add them for you. What else can I do to help prevent falls? Wear shoes that: Do not have high heels. Have rubber bottoms. Are comfortable and fit you well. Are closed at the toe. Do not wear sandals. If you use a stepladder: Make sure that it is fully opened. Do not climb a closed stepladder. Make sure that both sides of the stepladder are locked into place. Ask someone to hold it for you, if possible. Clearly mark and make sure that you can see: Any grab bars or handrails. First and last steps. Where the edge of each step is. Use tools that help you move around (mobility aids) if they are needed. These include: Canes. Walkers. Scooters. Crutches. Turn on the lights when you go into a dark area. Replace any light bulbs as soon as they burn out. Set up your furniture so you have a clear path. Avoid moving your furniture around. If any of your floors are uneven, fix them. If there are any pets around you, be aware of where they are. Review your medicines with your doctor. Some medicines can make you feel dizzy. This can increase your chance of falling. Ask your doctor what other things that you can do to help prevent falls. This information is not intended to replace advice given to you by your health care provider. Make sure you discuss any questions  you have with your health care provider. Document Released: 05/09/2009 Document  Revised: 12/19/2015 Document Reviewed: 08/17/2014 Elsevier Interactive Patient Education  2017 [employer].

## 2022-10-21 ENCOUNTER — Encounter: Payer: Self-pay | Admitting: Adult Health

## 2022-10-21 NOTE — Telephone Encounter (Signed)
Please contact wife as discussed. Thank you.

## 2022-10-22 ENCOUNTER — Encounter (INDEPENDENT_AMBULATORY_CARE_PROVIDER_SITE_OTHER): Payer: Medicare Other | Admitting: Ophthalmology

## 2022-10-28 ENCOUNTER — Telehealth: Payer: Self-pay

## 2022-10-28 ENCOUNTER — Other Ambulatory Visit: Payer: Medicare Other

## 2022-10-28 DIAGNOSIS — Z23 Encounter for immunization: Secondary | ICD-10-CM

## 2022-10-28 NOTE — Progress Notes (Signed)
Patient ID: Jesus MillerMichael W Maxwell, male   DOB: January 08, 1951, 72 y.o.   MRN: [id number]  Care Management & Coordination Services Pharmacy Team  Reason for Encounter: Medication coordination and delivery  Contacted patient to discuss medications and coordinate delivery from [facility].  [facility]   Last adherence delivery date:3/13//24      Patient is due for next adherence delivery on: 11/06/22  This delivery to include: Adherence Packaging  30 Days  Alfuzosin 10 Mg: Take one at Bedtime Verapamil (Calan-SR) 120 mg: Take one tab at breakfast Citalopram (Celexa) 20 mg: Take one tab at bedtime Trihexyphenidyl (Artane) 2 mg: Take half tab at breakfast and half at dinner Carbidopa - Levodopa (Sinemet) 25-100 mg: Take one and a half tabs before breakfast, lunch and dinner Pantoprazole (Protonix) 40 mg: Take one tab at breakfast and dinner Irbesartan (Avapro) 75 mg: Take one tab at breakfast Pitavastatin Calcium (Livalo) 2 mg: Take one tab every other day at breakfast Latanoprost Eye Drops 0.01%: 1 Drop in each eye at bedtime   the following has been discontinued by prescriber so will not be in his packaging going forward: Donepezil (Aricept) 10 mg: Take one tab at bedtime    Unable to reach to confirm medication delivery left vm to advise delivery will be attempted on 11/06/22    Chart review: Recent office visits:  3//19/24 Barb MerinoAllen, Nickeah E, LPN - Patient presented for Medicare Annual Wellness Exam. No medication changes. Patent voiced goal of better mobility and increased strength.   Recent consult visits:  10/12/22 Ihor AustinMcCue, Jessica, NP (Neurology) - Patient presented for Parkinsonian tremor and other concerns. Prescribed Rivastigmine Tartrate. Stopped Donepezil.  10/05/22 R. Ramos - Patient presented to [facility] for low back pain follow up. No medication changes Patient given ok to use Tylenol 3.   Hospital visits:  None in previous 6  months  Medications: Outpatient Encounter Medications as of 10/28/2022  Medication Sig   acetaminophen (TYLENOL) 500 MG tablet Take 2 tablets (1,000 mg total) by mouth every 6 (six) hours as needed for mild pain.   acetaminophen-codeine (TYLENOL #3) 300-30 MG tablet Take 1 tablet by mouth 3 times daily for 5 days (Patient not taking: Reported on 10/13/2022)   alfuzosin (UROXATRAL) 10 MG 24 hr tablet Take 10 mg by mouth daily.   carbidopa-levodopa (SINEMET IR) 25-100 MG tablet Take 1.5 tablets by mouth 3 (three) times daily.   citalopram (CELEXA) 20 MG tablet TAKE ONE TABLET BY MOUTH EVERYDAY AT BEDTIME   dorzolamide-timolol (COSOPT) 22.3-6.8 MG/ML ophthalmic solution Instill 1 drop into both eyes twice a day   FIBER ADULT GUMMIES PO Take 1 tablet by mouth 2 (two) times daily.   ibuprofen (ADVIL) 600 MG tablet Take 1 tablet (600 mg total) by mouth every 8 (eight) hours as needed for moderate pain.   ibuprofen (ADVIL) 600 MG tablet Take 1 tablet by mouth 3 times daily as needed.   irbesartan (AVAPRO) 75 MG tablet TAKE ONE TABLET BY MOUTH EVERY MORNING   Multiple Vitamins-Minerals (MULTIVITAMIN ADULTS 50+ PO) Take 1 tablet by mouth daily.   nitroGLYCERIN (NITROSTAT) 0.4 MG SL tablet Place 1 tablet (0.4 mg total) under the tongue every 5 (five) minutes as needed for chest pain.   pantoprazole (PROTONIX) 40 MG tablet TAKE ONE TABLET BY MOUTH EVERY MORNING and TAKE ONE TABLET BY MOUTH EVERY EVENING   Pitavastatin Calcium (LIVALO) 2 MG TABS TAKE ONE TABLET BY MOUTH EVERY OTHER DAY  rivastigmine (EXELON) 1.5 MG capsule Take 1 capsule (1.5 mg total) by mouth 2 (two) times daily.   timolol (TIMOPTIC) 0.5 % ophthalmic solution Instill 1 drop into both eyes every morning   trihexyphenidyl (ARTANE) 2 MG tablet TAKE 1/2 TABLET BY MOUTH TWICE DAILY WITH A MEAL   verapamil (CALAN-SR) 120 MG CR tablet TAKE ONE TABLET BY MOUTH EVERY MORNING   No facility-administered encounter medications on file as of 10/28/2022.    BP Readings from Last 3 Encounters:  10/12/22 115/71  04/09/22 126/82  04/08/22 (!) 164/96    Pulse Readings from Last 3 Encounters:  10/12/22 78  04/09/22 (!) 57  04/08/22 (!) 57    No results found for: "HGBA1C" Lab Results  Component Value Date   CREATININE 1.20 10/09/2021   BUN 20 10/09/2021   GFRNONAA >60 06/30/2021   GFRAA 68 09/10/2020   NA 137 10/09/2021   K 4.5 10/09/2021   CALCIUM 9.1 10/09/2021   CO2 23 10/09/2021       Pamala DuffelLaresia Green CMA Clinical Pharmacist Assistant [phone]

## 2022-10-29 ENCOUNTER — Encounter: Payer: Self-pay | Admitting: Adult Health

## 2022-11-03 ENCOUNTER — Encounter: Payer: Self-pay | Admitting: Adult Health

## 2022-11-04 DIAGNOSIS — H338 Other retinal detachments: Secondary | ICD-10-CM | POA: Diagnosis not present

## 2022-11-04 DIAGNOSIS — Z961 Presence of intraocular lens: Secondary | ICD-10-CM | POA: Diagnosis not present

## 2022-11-04 DIAGNOSIS — H35372 Puckering of macula, left eye: Secondary | ICD-10-CM | POA: Diagnosis not present

## 2022-11-04 DIAGNOSIS — H47391 Other disorders of optic disc, right eye: Secondary | ICD-10-CM | POA: Diagnosis not present

## 2022-11-04 DIAGNOSIS — H3582 Retinal ischemia: Secondary | ICD-10-CM | POA: Diagnosis not present

## 2022-11-04 DIAGNOSIS — H31092 Other chorioretinal scars, left eye: Secondary | ICD-10-CM | POA: Diagnosis not present

## 2022-11-04 DIAGNOSIS — H35352 Cystoid macular degeneration, left eye: Secondary | ICD-10-CM | POA: Diagnosis not present

## 2022-11-11 DIAGNOSIS — M4316 Spondylolisthesis, lumbar region: Secondary | ICD-10-CM | POA: Diagnosis not present

## 2022-11-12 ENCOUNTER — Encounter: Payer: Self-pay | Admitting: Adult Health

## 2022-11-16 ENCOUNTER — Other Ambulatory Visit: Payer: Self-pay | Admitting: Neurology

## 2022-11-16 MED ORDER — RIVASTIGMINE TARTRATE 3 MG PO CAPS: 3.0000 mg | ORAL_CAPSULE | Freq: Two times a day (BID) | ORAL | 1 refills | Status: DC

## 2022-11-19 ENCOUNTER — Encounter: Payer: Self-pay | Admitting: Adult Health

## 2022-11-23 DIAGNOSIS — M2021 Hallux rigidus, right foot: Secondary | ICD-10-CM | POA: Insufficient documentation

## 2022-11-23 DIAGNOSIS — M79671 Pain in right foot: Secondary | ICD-10-CM | POA: Diagnosis not present

## 2022-11-23 DIAGNOSIS — T8484XA Pain due to internal orthopedic prosthetic devices, implants and grafts, initial encounter: Secondary | ICD-10-CM | POA: Diagnosis not present

## 2022-11-23 DIAGNOSIS — M7741 Metatarsalgia, right foot: Secondary | ICD-10-CM | POA: Diagnosis not present

## 2022-11-24 ENCOUNTER — Telehealth: Payer: Self-pay

## 2022-11-24 NOTE — Progress Notes (Signed)
Patient ID: [name], male   DOB: 07-27-1951, 72 y.o.   MRN: [id number]  Care Management & Coordination Services Pharmacy Team  Reason for Encounter: Medication coordination and delivery  Contacted patient to discuss medications and coordinate delivery from [facility]. Unsuccessful outreach. Left voicemail for patient to return call. Cycle dispensing form sent to [facility] for review.   Last adherence delivery date:11/06/22       Patient is due for next adherence delivery on: 12/07/22  This delivery to include: Adherence Packaging  30 Days  Alfuzosin 10 Mg: Take one at Bedtime Verapamil (Calan-SR) 120 mg: Take one tab at breakfast Citalopram (Celexa) 20 mg: Take one tab at bedtime Dorzolamide 22.3 mg : Oe drop n each eye 2x daily Carbidopa - Levodopa (Sinemet) 25-100 mg: Take one and a half tabs before breakfast, lunch and dinner Pantoprazole (Protonix) 40 mg: Take one tab at breakfast and dinner Irbesartan (Avapro) 75 mg: Take one tab at breakfast Pitavastatin Calcium (Livalo) 2 mg: Take one tab every other day at breakfast Latanoprost Eye Drops 0.01%: 1 Drop in each eye at bedtime  Ibuprofen 600 mg  PRN Vial Rivastigmine 1.5 mg : Take one at Breakfast and one at [employer] requested from providers include: Alfredo Martinez, MD  [phone]  Alfuzosin 10 mg  Ernesto Rutherford, MD [phone]  dorzolamide-timolol 22.3-6.8 mg/mL drops    Delivery scheduled for 12/07/22. Unable to speak with patient to confirm date.     Chart review: Recent office visits:  None   Recent consult visits:  11/11/22 Conchita Paris MD, Nolon Lennert - Patient presented for Spondylolisthesis at L4-L5 level. No medication changes.  Hospital visits:  None in previous 6 months  Medications: Outpatient Encounter Medications as of 11/24/2022  Medication Sig   acetaminophen (TYLENOL) 500 MG tablet Take 2 tablets (1,000 mg total) by mouth every 6 (six) hours as needed for mild pain.    acetaminophen-codeine (TYLENOL #3) 300-30 MG tablet Take 1 tablet by mouth 3 times daily for 5 days (Patient not taking: Reported on 10/13/2022)   alfuzosin (UROXATRAL) 10 MG 24 hr tablet Take 10 mg by mouth daily.   carbidopa-levodopa (SINEMET IR) 25-100 MG tablet Take 1.5 tablets by mouth 3 (three) times daily.   citalopram (CELEXA) 20 MG tablet TAKE ONE TABLET BY MOUTH EVERYDAY AT BEDTIME   dorzolamide-timolol (COSOPT) 22.3-6.8 MG/ML ophthalmic solution Instill 1 drop into both eyes twice a day   FIBER ADULT GUMMIES PO Take 1 tablet by mouth 2 (two) times daily.   ibuprofen (ADVIL) 600 MG tablet Take 1 tablet (600 mg total) by mouth every 8 (eight) hours as needed for moderate pain.   ibuprofen (ADVIL) 600 MG tablet Take 1 tablet by mouth 3 times daily as needed.   irbesartan (AVAPRO) 75 MG tablet TAKE ONE TABLET BY MOUTH EVERY MORNING   Multiple Vitamins-Minerals (MULTIVITAMIN ADULTS 50+ PO) Take 1 tablet by mouth daily.   nitroGLYCERIN (NITROSTAT) 0.4 MG SL tablet Place 1 tablet (0.4 mg total) under the tongue every 5 (five) minutes as needed for chest pain.   pantoprazole (PROTONIX) 40 MG tablet TAKE ONE TABLET BY MOUTH EVERY MORNING and TAKE ONE TABLET BY MOUTH EVERY EVENING   Pitavastatin Calcium (LIVALO) 2 MG TABS TAKE ONE TABLET BY MOUTH EVERY OTHER DAY   rivastigmine (EXELON) 3 MG capsule Take 1 capsule (3 mg total) by mouth 2 (two) times daily.   timolol (TIMOPTIC) 0.5 % ophthalmic solution Instill 1 drop into both  eyes every morning   trihexyphenidyl (ARTANE) 2 MG tablet TAKE 1/2 TABLET BY MOUTH TWICE DAILY WITH A MEAL   verapamil (CALAN-SR) 120 MG CR tablet TAKE ONE TABLET BY MOUTH EVERY MORNING   No facility-administered encounter medications on file as of 11/24/2022.   BP Readings from Last 3 Encounters:  10/12/22 115/71  04/09/22 126/82  04/08/22 (!) 164/96    Pulse Readings from Last 3 Encounters:  10/12/22 78  04/09/22 (!) 57  04/08/22 (!) 57    No results found for:  "HGBA1C" Lab Results  Component Value Date   CREATININE 1.20 10/09/2021   BUN 20 10/09/2021   GFRNONAA >60 06/30/2021   GFRAA 68 09/10/2020   NA 137 10/09/2021   K 4.5 10/09/2021   CALCIUM 9.1 10/09/2021   CO2 23 10/09/2021      Pamala Duffel CMA Clinical Pharmacist Assistant [phone]

## 2022-11-25 ENCOUNTER — Telehealth: Payer: Self-pay | Admitting: Internal Medicine

## 2022-11-25 DIAGNOSIS — R3912 Poor urinary stream: Secondary | ICD-10-CM | POA: Diagnosis not present

## 2022-11-25 DIAGNOSIS — R350 Frequency of micturition: Secondary | ICD-10-CM | POA: Diagnosis not present

## 2022-11-25 MED ORDER — CITALOPRAM HYDROBROMIDE 20 MG PO TABS: 20.0000 mg | ORAL_TABLET | Freq: Every evening | ORAL | 1 refills | Status: DC

## 2022-11-25 NOTE — Telephone Encounter (Signed)
-----   Message from Sherrill Raring, [facility] sent at 11/25/2022  8:09 AM EDT ----- Regarding: Med Refill [facility] is requesting a medication refill for the following medication:  Citalopram 20mg  1 tab daily at bedtime  Pharmacy Info: [facility] - [place], [place] - [address]  Phone: [phone] Fax: [phone]   Thank you! Sherrill Raring Clinical Pharmacist [phone]

## 2022-11-25 NOTE — Telephone Encounter (Signed)
refilled 

## 2022-12-02 ENCOUNTER — Other Ambulatory Visit: Payer: Self-pay | Admitting: Family Medicine

## 2022-12-02 NOTE — Telephone Encounter (Signed)
Is it ok to refill? When should patient come in for a med check?

## 2022-12-23 ENCOUNTER — Other Ambulatory Visit: Payer: Self-pay | Admitting: Neurology

## 2022-12-24 ENCOUNTER — Encounter: Payer: Self-pay | Admitting: Adult Health

## 2022-12-28 ENCOUNTER — Telehealth: Payer: Self-pay

## 2022-12-28 NOTE — Progress Notes (Unsigned)
Patient ID: [name], male   DOB: Dec 07, 1950, 72 y.o.   MRN: [id number]  Care Management & Coordination Services Pharmacy Team  Reason for Encounter: Medication coordination and delivery  Contacted patient to discuss medications and coordinate delivery from [facility]. {US HC Outreach:28874} Cycle dispensing form sent to *** for review.   Last adherence delivery date:12/07/22       Patient is due for next adherence delivery on: 01/06/23   This delivery to include: Adherence Packaging  30 Days  Alfuzosin 10 Mg: Take one at Bedtime Verapamil (Calan-SR) 120 mg: Take one tab at breakfast Citalopram (Celexa) 20 mg: Take one tab at bedtime Dorzolamide 22.3 mg : Oe drop n each eye 2x daily Carbidopa - Levodopa (Sinemet) 25-100 mg: Take one and a half tabs before breakfast, lunch and dinner Pantoprazole (Protonix) 40 mg: Take one tab at breakfast and dinner Irbesartan (Avapro) 75 mg: Take one tab at breakfast Pitavastatin Calcium (Livalo) 2 mg: Take one tab every other day at breakfast Latanoprost Eye Drops 0.01%: 1 Drop in each eye at bedtime  Ibuprofen 600 mg  PRN Vial Rivastigmine 1.5 mg : Take one at Breakfast and one at [employer]   Patient declined the following medications this month: ***  {refills needed:25320}  {Delivery WUJW:11914}   Any concerns about your medications? {yes/no:20286}  How often do you forget or accidentally miss a dose? {Missed doses:25554}  Do you use a pillbox? {yes/no:20286}  Is patient in packaging {yes/no:20286}  If yes  What is the date on your next pill pack?  Any concerns or issues with your packaging?   Recent blood pressure readings are as follows:***  Recent blood glucose readings are as follows:***   Chart review: Recent office visits:  None  Recent consult visits:  None  Hospital visits:  None in previous 6 months  Medications: Outpatient Encounter Medications as of 12/28/2022  Medication Sig   acetaminophen  (TYLENOL) 500 MG tablet Take 2 tablets (1,000 mg total) by mouth every 6 (six) hours as needed for mild pain.   acetaminophen-codeine (TYLENOL #3) 300-30 MG tablet Take 1 tablet by mouth 3 times daily for 5 days (Patient not taking: Reported on 10/13/2022)   alfuzosin (UROXATRAL) 10 MG 24 hr tablet Take 10 mg by mouth daily.   carbidopa-levodopa (SINEMET IR) 25-100 MG tablet Take 1.5 tablets by mouth 3 (three) times daily.   citalopram (CELEXA) 20 MG tablet TAKE ONE TABLET BY MOUTH EVERYDAY AT BEDTIME   dorzolamide-timolol (COSOPT) 22.3-6.8 MG/ML ophthalmic solution Instill 1 drop into both eyes twice a day   FIBER ADULT GUMMIES PO Take 1 tablet by mouth 2 (two) times daily.   ibuprofen (ADVIL) 600 MG tablet Take 1 tablet (600 mg total) by mouth every 8 (eight) hours as needed for moderate pain.   ibuprofen (ADVIL) 600 MG tablet Take 1 tablet by mouth 3 times daily as needed.   irbesartan (AVAPRO) 75 MG tablet TAKE ONE TABLET BY MOUTH EVERY MORNING   Multiple Vitamins-Minerals (MULTIVITAMIN ADULTS 50+ PO) Take 1 tablet by mouth daily.   nitroGLYCERIN (NITROSTAT) 0.4 MG SL tablet Place 1 tablet (0.4 mg total) under the tongue every 5 (five) minutes as needed for chest pain.   pantoprazole (PROTONIX) 40 MG tablet TAKE ONE TABLET BY MOUTH EVERY MORNING and TAKE ONE TABLET BY MOUTH EVERY EVENING   Pitavastatin Calcium (LIVALO) 2 MG TABS TAKE ONE TABLET BY MOUTH EVERY OTHER DAY   rivastigmine (EXELON) 3 MG capsule Take 1 capsule (3  mg total) by mouth 2 (two) times daily.   timolol (TIMOPTIC) 0.5 % ophthalmic solution Instill 1 drop into both eyes every morning   trihexyphenidyl (ARTANE) 2 MG tablet TAKE 1/2 TABLET BY MOUTH TWICE DAILY WITH A MEAL   verapamil (CALAN-SR) 120 MG CR tablet TAKE ONE TABLET BY MOUTH EVERY MORNING   No facility-administered encounter medications on file as of 12/28/2022.   BP Readings from Last 3 Encounters:  10/12/22 115/71  04/09/22 126/82  04/08/22 (!) 164/96    Pulse  Readings from Last 3 Encounters:  10/12/22 78  04/09/22 (!) 57  04/08/22 (!) 57    No results found for: "HGBA1C" Lab Results  Component Value Date   CREATININE 1.20 10/09/2021   BUN 20 10/09/2021   GFRNONAA >60 06/30/2021   GFRAA 68 09/10/2020   NA 137 10/09/2021   K 4.5 10/09/2021   CALCIUM 9.1 10/09/2021   CO2 23 10/09/2021      Pamala Duffel CMA Clinical Pharmacist Assistant [phone]

## 2022-12-31 ENCOUNTER — Other Ambulatory Visit: Payer: Self-pay | Admitting: Family Medicine

## 2022-12-31 ENCOUNTER — Encounter: Payer: Self-pay | Admitting: Adult Health

## 2022-12-31 ENCOUNTER — Other Ambulatory Visit: Payer: Self-pay | Admitting: Neurology

## 2022-12-31 NOTE — Telephone Encounter (Signed)
Addressed in [facility] 12/31/22

## 2023-01-06 DIAGNOSIS — M2041 Other hammer toe(s) (acquired), right foot: Secondary | ICD-10-CM | POA: Diagnosis not present

## 2023-01-06 DIAGNOSIS — M7741 Metatarsalgia, right foot: Secondary | ICD-10-CM | POA: Diagnosis not present

## 2023-01-06 DIAGNOSIS — M2021 Hallux rigidus, right foot: Secondary | ICD-10-CM | POA: Diagnosis not present

## 2023-01-07 NOTE — Progress Notes (Signed)
Cardiology Clinic Note   Patient Name: [name] Date of Encounter: 01/15/2023  Primary Care Provider:  Ronnald Nian, MD Primary Cardiologist:  Bryan Lemma, MD  Patient Profile    Mr. [name] is a 72 year old male with known history of AAA, hypertension, hyperlipidemia, and family history of heart disease.  Other history includes Parkinson's disease followed by neurology.  Was intolerant of statin therapy to include simvastatin and rosuvastatin however once they were restarted every other day he was able to tolerate and Livalo was started.  Last seen in the office 1 year ago.  Of note most recent CT scan in 2022 did not reveal evidence of aneurysm or dissection.  Past Medical History    Past Medical History:  Diagnosis Date   Allergy    seasonal   Arthritis    BPH (benign prostatic hyperplasia)    Complication of anesthesia    Pt. stated he had a reaction that ended in him requiring urinary cath placement; hx delirium worsening parkinson symptoms   Diverticulosis    GERD (gastroesophageal reflux disease)    esophageal spasms   Glaucoma    Gout    Head injury, closed, with concussion    Hepatitis C    chronic - Has been treated with Harvoni   HLD (hyperlipidemia)    statin intolerant (Crestor & Simvastatin) - Taking Livalo 1mg  / week   Hypertension    Parkinson's disease    Plantar fasciitis    right   PVD (peripheral vascular disease) (HCC)    With no claudication; only mild abdominal aortic atherosclerosis noted on ultrasound.   Spinal stenosis of lumbar region    Thoracic ascending aortic aneurysm (HCC)    4.2 cm ascending TAA 09/2016 CT, 1 yr f/u rec   Ulcer    Past Surgical History:  Procedure Laterality Date   BUNIONECTOMY WITH WEIL OSTEOTOMY Right 11/02/2019   Procedure: Right Foot Lapidus, Modified McBride Bunionectomy,  Hallux Akin Osteotomy;  Surgeon: Toni Arthurs, MD;  Location: [facility];  Service: Orthopedics;  Laterality: Right;    CARDIAC CATHETERIZATION  2005   30% Cx. Dr. Elsie Lincoln   cataract surgery Left 11/05/2014   COLONOSCOPY     COLONOSCOPY N/A 01/09/2021   Procedure: COLONOSCOPY;  Surgeon: Kerin Salen, MD;  Location: [facility];  Service: Gastroenterology;  Laterality: N/A;   ESOPHAGOGASTRODUODENOSCOPY N/A 05/02/2015   Procedure: ESOPHAGOGASTRODUODENOSCOPY (EGD);  Surgeon: Bernette Redbird, MD;  Location: [facility];  Service: Endoscopy;  Laterality: N/A;   ESOPHAGOGASTRODUODENOSCOPY  05/02/2015   no source of pt chest pain endoscopically evident. small hiatal hernia.   ESOPHAGOGASTRODUODENOSCOPY (EGD) WITH PROPOFOL N/A 01/02/2021   Procedure: ESOPHAGOGASTRODUODENOSCOPY (EGD) WITH PROPOFOL;  Surgeon: Vida Rigger, MD;  Location: [facility];  Service: Endoscopy;  Laterality: N/A;   EYE SURGERY     HARDWARE REMOVAL Right 11/02/2019   Procedure: Second Metatarsal Removal of Deep Implant and Rotational Osteotomy;  Surgeon: Toni Arthurs, MD;  Location: [facility];  Service: Orthopedics;  Laterality: Right;   IR ANGIOGRAM SELECTIVE EACH ADDITIONAL VESSEL  01/04/2021   IR ANGIOGRAM SELECTIVE EACH ADDITIONAL VESSEL  01/04/2021   IR ANGIOGRAM VISCERAL SELECTIVE  01/04/2021   IR US GUIDE VASC ACCESS RIGHT  01/04/2021   LUMBAR LAMINECTOMY/DECOMPRESSION MICRODISCECTOMY N/A 07/03/2021   Procedure: Lumbar three through five decompression with lumbar three through five insitu fusion;  Surgeon: Venita Lick, MD;  Location: [facility];  Service: Orthopedics;  Laterality: N/A;   MEMBRANE PEEL Left 03/14/2014  Procedure: MEMBRANE PEEL; ENDOLASER;  Surgeon: Edmon Crape, MD;  Location: [facility];  Service: Ophthalmology;  Laterality: Left;   NM MYOVIEW LTD  10/2015   LOW RISK. Small, fixed basal lateral defect - likely diaphragmatic attenuation. EF 69%   PARS PLANA VITRECTOMY Left 03/14/2014   Procedure: PARS PLANA VITRECTOMY WITH 25 GAUGE;  Surgeon: Edmon Crape, MD;  Location: [facility] OR;  Service: Ophthalmology;   Laterality: Left;   shave  02/03/2022   angiofibroma   TONSILLECTOMY     TRANSTHORACIC ECHOCARDIOGRAM  01/07/2021   Normal EF 60 to 65%.  No R WMA.  GRII DD-moderately dilated LA..  Mildly dilated RV but normal function.  Normal RAP/CVP.  Trivial AI with mild to moderate sclerosis-no stenosis   UMBILICAL HERNIA REPAIR  2023   UPPER GASTROINTESTINAL ENDOSCOPY     WEIL OSTEOTOMY Right 09/01/2017   Procedure: RIGHT GREAT TOE CHEVRON AND WEIL OSTEOTOMY 2ND METATARSAL;  Surgeon: Nadara Mustard, MD;  Location: [facility];  Service: Orthopedics;  Laterality: Right;   XI ROBOTIC ASSISTED VENTRAL HERNIA N/A 03/17/2022   Procedure: XI ROBOTIC ASSISTED VENTRAL HERNIA;  Surgeon: Henrene Dodge, MD;  Location: [facility];  Service: General;  Laterality: N/A;    Allergies  Allergies  Allergen Reactions   Anesthesia S-I-40 [Propofol] Other (See Comments)   Other Other (See Comments)    Severe delirium with anesthesia per spouse enhances parkinsons symptoms    History of Present Illness    [name] comes today without any new complaints.  He has been medically compliant.  He has on rare occasion felt some fluttering in his chest but otherwise has been doing well.  No chest pain, dyspnea on exertion, has had some dizziness associated with Parkinson's.  He is not very active due to gait disturbance.  Home Medications    Current Outpatient Medications  Medication Sig Dispense Refill   acetaminophen (TYLENOL) 500 MG tablet Take 2 tablets (1,000 mg total) by mouth every 6 (six) hours as needed for mild pain.     alfuzosin (UROXATRAL) 10 MG 24 hr tablet Take 10 mg by mouth daily.     carbidopa-levodopa (SINEMET IR) 25-100 MG tablet Take 1.5 tablets by mouth 3 (three) times daily. 405 tablet 3   citalopram (CELEXA) 20 MG tablet TAKE ONE TABLET BY MOUTH EVERYDAY AT BEDTIME 90 tablet 1   dorzolamide-timolol (COSOPT) 22.3-6.8 MG/ML ophthalmic solution Instill 1 drop into both eyes twice a day 10 mL 11   FIBER  ADULT GUMMIES PO Take 1 tablet by mouth 2 (two) times daily.     ibuprofen (ADVIL) 600 MG tablet Take 1 tablet (600 mg total) by mouth every 8 (eight) hours as needed for moderate pain. 60 tablet 1   irbesartan (AVAPRO) 75 MG tablet TAKE ONE TABLET BY MOUTH EVERY MORNING 90 tablet 0   Multiple Vitamins-Minerals (MULTIVITAMIN ADULTS 50+ PO) Take 1 tablet by mouth daily.     pantoprazole (PROTONIX) 40 MG tablet TAKE ONE TABLET BY MOUTH EVERY MORNING and TAKE ONE TABLET BY MOUTH EVERY EVENING 180 tablet 0   Pitavastatin Calcium (LIVALO) 2 MG TABS TAKE ONE TABLET BY MOUTH EVERY OTHER DAY 45 tablet 0   rivastigmine (EXELON) 3 MG capsule TAKE ONE CAPSULE BY MOUTH TWICE DAILY 60 capsule 1   timolol (TIMOPTIC) 0.5 % ophthalmic solution Instill 1 drop into both eyes every morning 30 mL 4   trihexyphenidyl (ARTANE) 2 MG tablet TAKE 1/2 TABLET BY MOUTH TWICE DAILY WITH A MEAL  30 tablet 5   verapamil (CALAN-SR) 120 MG CR tablet TAKE ONE TABLET BY MOUTH EVERY MORNING 90 tablet 0   acetaminophen-codeine (TYLENOL #3) 300-30 MG tablet Take 1 tablet by mouth 3 times daily for 5 days (Patient not taking: Reported on 01/15/2023) 15 tablet 0   ibuprofen (ADVIL) 600 MG tablet Take 1 tablet by mouth 3 times daily as needed. 90 tablet 2   nitroGLYCERIN (NITROSTAT) 0.4 MG SL tablet Place 1 tablet (0.4 mg total) under the tongue every 5 (five) minutes as needed for chest pain. (Patient not taking: Reported on 01/15/2023) 25 tablet 3   No current facility-administered medications for this visit.     Family History    Family History  Problem Relation Age of Onset   Heart disease Mother    Cancer Paternal Grandfather    Cancer Sister    He indicated that his mother is deceased. He indicated that his father is deceased. He indicated that his sister is alive. He indicated that his maternal grandmother is deceased. He indicated that his maternal grandfather is deceased. He indicated that his paternal grandmother is  deceased. He indicated that his paternal grandfather is deceased.  Social History    Social History   Socioeconomic History   Marital status: Married    Spouse name: barbra gen   Number of children: 3   Years of education: AS   Highest education level: Not on file  Occupational History   Occupation: self employed    Employer: [employer]    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  Tobacco Use   Smoking status: Former    Types: Cigars    Quit date: 07/27/1988    Years since quitting: 34.4   Smokeless tobacco: Never  Vaping Use   Vaping Use: Never used  Substance and Sexual Activity   Alcohol use: Yes    Alcohol/week: 3.0 standard drinks of alcohol    Types: 1 Glasses of wine, 1 Cans of beer, 1 Shots of liquor per week    Comment: occasional   Drug use: No   Sexual activity: Yes  Other Topics Concern   Not on file  Social History Narrative   Lives with wife    Right handed   Drinks 1-2 cups caffeine daily   Social Determinants of Health   Financial Resource Strain: Low Risk  (10/13/2022)   Overall Financial Resource Strain (CARDIA)    Difficulty of Paying Living Expenses: Not hard at all  Food Insecurity: No Food Insecurity (10/13/2022)   Hunger Vital Sign    Worried About Running Out of Food in the Last Year: Never true    Ran Out of Food in the Last Year: Never true  Transportation Needs: No Transportation Needs (10/13/2022)   PRAPARE - Administrator, Civil Service (Medical): No    Lack of Transportation (Non-Medical): No  Physical Activity: Insufficiently Active (10/13/2022)   Exercise Vital Sign    Days of Exercise per Week: 2 days    Minutes of Exercise per Session: 60 min  Stress: No Stress Concern Present (10/13/2022)   [employer] of Occupational Health - Occupational Stress Questionnaire    Feeling of Stress : Only a little  Social Connections: Not on file  Intimate Partner Violence: Not on file     Review of Systems    General:  No chills, fever,  night sweats or weight changes.  Cardiovascular:  No chest pain, dyspnea on exertion, edema, orthopnea, palpitations, paroxysmal nocturnal dyspnea. Dermatological:  No rash, lesions/masses Respiratory: No cough, dyspnea Urologic: No hematuria, dysuria Abdominal:   No nausea, vomiting, diarrhea, bright red blood per rectum, melena, or hematemesis Neurologic:  No visual changes, wkns, changes in mental status.  Positive for gait disturbance All other systems reviewed and are otherwise negative except as noted above.     Physical Exam    VS:  BP 134/88 (BP Location: Left Arm, Patient Position: Sitting, Cuff Size: Normal)   Pulse (!) 58   Ht 5\' 10"  (1.778 m)   Wt 152 lb 6.4 oz (69.1 kg)   SpO2 97%   BMI 21.87 kg/m  , BMI Body mass index is 21.87 kg/m.     GEN: Well nourished, well developed, in no acute distress. HEENT: normal. Neck: Supple, no JVD, carotid bruits, or masses. Cardiac: RRR, 2/6 systolic murmur left sternal border, rubs, or gallops. No clubbing, cyanosis, edema.  Radials/DP/PT 2+ and equal bilaterally.  Respiratory:  Respirations regular and unlabored, clear to auscultation bilaterally. GI: Soft, nontender, nondistended, BS + x 4. MS: no deformity or atrophy. Skin: warm and dry, no rash. Neuro:  Strength and sensation are intact.  Essential tremor noted on the left. Psych: Normal affect.  Accessory Clinical Findings    ECG personally reviewed by me today-sinus bradycardia heart rate of 58 bpm with some artifact noted related to tremors.  No significant changes since last EKG.  Lab Results  Component Value Date   WBC 5.1 03/17/2022   HGB 11.8 (L) 03/17/2022   HCT 36.4 (L) 03/17/2022   MCV 87.1 03/17/2022   PLT 218 03/17/2022   Lab Results  Component Value Date   CREATININE 1.20 10/09/2021   BUN 20 10/09/2021   NA 137 10/09/2021   K 4.5 10/09/2021   CL 101 10/09/2021   CO2 23 10/09/2021   Lab Results  Component Value Date   ALT 11 10/09/2021   AST 18  10/09/2021   ALKPHOS 73 10/09/2021   BILITOT 0.3 10/09/2021   Lab Results  Component Value Date   CHOL 165 10/09/2021   HDL 58 10/09/2021   LDLCALC 98 10/09/2021   TRIG 41 10/09/2021   CHOLHDL 2.8 10/09/2021    No results found for: "HGBA1C"  Review of Prior Studies: CT of abdomen and pelvis 01/04/2021 1. Positive for active diverticular bleed in the ascending colon just proximal to the hepatic flexure. 2. Scattered atherosclerotic vascular calcifications without aneurysm, dissection or occlusion. Aortic Atherosclerosis (ICD10-I70.0). 3. Progressive lower lumbar degenerative disc disease and facet arthropathy now with mild grade 1 anterolisthesis of L4 on L5. 4. Additional ancillary findings as above without significant interval change compared to prior imaging.  Assessment & Plan   1.  Hypertension: Blood pressure is well-controlled on current medication regimen.  Medications were provided by primary care provider, he remains on irbesartan 75 mg daily and verapamil 120 mg daily.  Labs are followed by PCP.  2.  Hyperlipidemia: Remains on statin therapy with atorvastatin 2 mg by mouth every other day.  He was intolerant to Lipitor and atorvastatin causing severe nausea.  Labs are followed by PCP.  3.  History of AAA: Repeat CT of the abdomen pelvis for surveillance.  Of note most recent CT scan in 2022 did not reveal AAA.          Signed, Bettey Mare. Liborio Nixon, ANP, AACC   01/15/2023 4:08 PM      Office [phone] Fax [phone]  Notice: This dictation was prepared with Dragon dictation along  with smaller phrase technology. Any transcriptional errors that result from this process are unintentional and may not be corrected upon review.

## 2023-01-15 ENCOUNTER — Encounter: Payer: Self-pay | Admitting: Adult Health

## 2023-01-15 ENCOUNTER — Ambulatory Visit: Payer: Medicare Other | Attending: Adult Health | Admitting: Adult Health

## 2023-01-15 VITALS — BP 134/88 | HR 58 | Ht 70.0 in | Wt 152.4 lb

## 2023-01-15 DIAGNOSIS — I700 Atherosclerosis of aorta: Secondary | ICD-10-CM | POA: Diagnosis not present

## 2023-01-15 NOTE — Patient Instructions (Signed)
Medication Instructions:  No Changes *If you need a refill on your cardiac medications before your next appointment, please call your pharmacy*   Lab Work: CMET, CBC, Lipid Panel. To Be Done next Week. If you have labs (blood work) drawn today and your tests are completely normal, you will receive your results only by: MyChart Message (if you have MyChart) OR A paper copy in the mail If you have any lab test that is abnormal or we need to change your treatment, we will call you to review the results.   Testing/Procedures: Non-Cardiac CT scanning, (CAT scanning), is a noninvasive, special x-ray that produces cross-sectional images of the body using x-rays and a computer. CT scans help physicians diagnose and treat medical conditions. For some CT exams, a contrast material is used to enhance visibility in the area of the body being studied. CT scans provide greater clarity and reveal more details than regular x-ray exams.    Follow-Up: At [facility], you and your health needs are our priority.  As part of our continuing mission to provide you with exceptional heart care, we have created designated Provider Care Teams.  These Care Teams include your primary Cardiologist (physician) and Advanced Practice Providers (APPs -  Physician Assistants and Nurse Practitioners) who all work together to provide you with the care you need, when you need it.  We recommend signing up for the patient portal called "MyChart".  Sign up information is provided on this After Visit Summary.  MyChart is used to connect with patients for Virtual Visits (Telemedicine).  Patients are able to view lab/test results, encounter notes, upcoming appointments, etc.  Non-urgent messages can be sent to your provider as well.   To learn more about what you can do with MyChart, go to [email or web address].    Your next appointment:   1 year(s)  Provider:   Bryan Lemma, MD

## 2023-01-18 ENCOUNTER — Encounter: Payer: Self-pay | Admitting: Cardiology

## 2023-01-19 DIAGNOSIS — I700 Atherosclerosis of aorta: Secondary | ICD-10-CM | POA: Diagnosis not present

## 2023-01-20 ENCOUNTER — Encounter: Payer: Self-pay | Admitting: Family Medicine

## 2023-01-20 ENCOUNTER — Telehealth: Payer: Self-pay

## 2023-01-20 LAB — COMPREHENSIVE METABOLIC PANEL
ALT: 7 IU/L (ref 0–44)
AST: 18 IU/L (ref 0–40)
Albumin: 4.5 g/dL (ref 3.8–4.8)
Alkaline Phosphatase: 70 IU/L (ref 44–121)
BUN/Creatinine Ratio: 21 (ref 10–24)
BUN: 27 mg/dL (ref 8–27)
Bilirubin Total: 0.3 mg/dL (ref 0.0–1.2)
CO2: 23 mmol/L (ref 20–29)
Calcium: 9.7 mg/dL (ref 8.6–10.2)
Chloride: 102 mmol/L (ref 96–106)
Creatinine, Ser: 1.26 mg/dL (ref 0.76–1.27)
Globulin, Total: 2.7 g/dL (ref 1.5–4.5)
Glucose: 88 mg/dL (ref 70–99)
Potassium: 4.5 mmol/L (ref 3.5–5.2)
Sodium: 136 mmol/L (ref 134–144)
Total Protein: 7.2 g/dL (ref 6.0–8.5)
eGFR: 60 mL/min/{1.73_m2} (ref >59)

## 2023-01-20 LAB — LIPID PANEL
Chol/HDL Ratio: 2.5 ratio (ref 0.0–5.0)
Cholesterol, Total: 136 mg/dL (ref 100–199)
HDL: 55 mg/dL (ref >39)
LDL Chol Calc (NIH): 72 mg/dL (ref 0–99)
Triglycerides: 37 mg/dL (ref 0–149)
VLDL Cholesterol Cal: 9 mg/dL (ref 5–40)

## 2023-01-20 LAB — CBC
Hematocrit: 39.6 % (ref 37.5–51.0)
Hemoglobin: 12.9 g/dL — ABNORMAL LOW (ref 13.0–17.7)
MCH: 29.3 pg (ref 26.6–33.0)
MCHC: 32.6 g/dL (ref 31.5–35.7)
MCV: 90 fL (ref 79–97)
Platelets: 235 10*3/uL (ref 150–450)
RBC: 4.4 x10E6/uL (ref 4.14–5.80)
RDW: 12.3 % (ref 11.6–15.4)
WBC: 5.8 10*3/uL (ref 3.4–10.8)

## 2023-01-20 NOTE — Telephone Encounter (Addendum)
Called patient regarding results. Left message for patient to call office.----- Message from Jodelle Gross, NP sent at 01/20/2023 10:46 AM EDT ----- Labs are essentially normal.  His anemia is improved. Continue his current regimen as directed.   KL

## 2023-01-20 NOTE — Telephone Encounter (Signed)
Patient's wife wants to know when you would like patient to come in for a med check/ follow up? Is he due for a visit now?

## 2023-01-26 ENCOUNTER — Telehealth: Payer: Self-pay

## 2023-01-26 NOTE — Telephone Encounter (Addendum)
Results viewed by patient via Mychart----- Message from Jodelle Gross, NP sent at 01/20/2023 10:46 AM EDT ----- Labs are essentially normal.  His anemia is improved. Continue his current regimen as directed.   KL

## 2023-02-09 ENCOUNTER — Encounter: Payer: Self-pay | Admitting: Family Medicine

## 2023-02-09 ENCOUNTER — Ambulatory Visit: Payer: Medicare Other | Admitting: Family Medicine

## 2023-02-09 VITALS — BP 130/86 | HR 61 | Temp 97.6°F | Resp 16 | Wt 153.4 lb

## 2023-02-09 DIAGNOSIS — H34812 Central retinal vein occlusion, left eye, with macular edema: Secondary | ICD-10-CM

## 2023-02-09 DIAGNOSIS — N3281 Overactive bladder: Secondary | ICD-10-CM

## 2023-02-09 DIAGNOSIS — I700 Atherosclerosis of aorta: Secondary | ICD-10-CM

## 2023-02-09 DIAGNOSIS — E785 Hyperlipidemia, unspecified: Secondary | ICD-10-CM

## 2023-02-09 DIAGNOSIS — M48062 Spinal stenosis, lumbar region with neurogenic claudication: Secondary | ICD-10-CM

## 2023-02-09 DIAGNOSIS — H401121 Primary open-angle glaucoma, left eye, mild stage: Secondary | ICD-10-CM

## 2023-02-09 DIAGNOSIS — I10 Essential (primary) hypertension: Secondary | ICD-10-CM

## 2023-02-09 DIAGNOSIS — H18231 Secondary corneal edema, right eye: Secondary | ICD-10-CM

## 2023-02-09 DIAGNOSIS — F32A Depression, unspecified: Secondary | ICD-10-CM

## 2023-02-09 DIAGNOSIS — M129 Arthropathy, unspecified: Secondary | ICD-10-CM

## 2023-02-09 DIAGNOSIS — K219 Gastro-esophageal reflux disease without esophagitis: Secondary | ICD-10-CM

## 2023-02-09 DIAGNOSIS — G3184 Mild cognitive impairment, so stated: Secondary | ICD-10-CM

## 2023-02-09 DIAGNOSIS — G20C Parkinsonism, unspecified: Secondary | ICD-10-CM

## 2023-02-09 NOTE — Progress Notes (Signed)
   Subjective:    Patient ID: [name], male    DOB: Dec 06, 1950, 72 y.o.   MRN: [id number]  HPI He is here for an interval evaluation.  He apparently had a recent urinalysis done as an outpatient which did show possible protein in his urine.  He has seen urology in the past and presently is on Uroxatrol.  He is also taking Avapro without difficulty as well as verapamil.  He continues on Exelon and Artane and is followed by neurology.  Continues on Livalo and is having no difficulty with that.  He also continues to be followed by ophthalmology for retinal vein occlusion, glaucoma, corneal edema.  He is also had some difficulty with cognitive impairment.  He continues on Celexa.   Review of Systems     Objective:    Physical Exam Alert and in no distress otherwise not examined Urinalysis in the office showed a specific gravity of 1.010 with other test negative.      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  He is also to bring a urine specimen in to ensure that it truly is negative for protein.  He is comfortable with that.

## 2023-02-11 ENCOUNTER — Encounter: Payer: Self-pay | Admitting: Family Medicine

## 2023-02-11 ENCOUNTER — Encounter: Payer: Self-pay | Admitting: Adult Health

## 2023-02-11 DIAGNOSIS — H01024 Squamous blepharitis left upper eyelid: Secondary | ICD-10-CM | POA: Diagnosis not present

## 2023-02-11 DIAGNOSIS — H4720 Unspecified optic atrophy: Secondary | ICD-10-CM | POA: Diagnosis not present

## 2023-02-11 DIAGNOSIS — H401132 Primary open-angle glaucoma, bilateral, moderate stage: Secondary | ICD-10-CM | POA: Diagnosis not present

## 2023-02-11 DIAGNOSIS — H35372 Puckering of macula, left eye: Secondary | ICD-10-CM | POA: Diagnosis not present

## 2023-02-11 DIAGNOSIS — H34832 Tributary (branch) retinal vein occlusion, left eye, with macular edema: Secondary | ICD-10-CM | POA: Diagnosis not present

## 2023-02-11 DIAGNOSIS — Z961 Presence of intraocular lens: Secondary | ICD-10-CM | POA: Diagnosis not present

## 2023-02-11 DIAGNOSIS — H01021 Squamous blepharitis right upper eyelid: Secondary | ICD-10-CM | POA: Diagnosis not present

## 2023-02-11 DIAGNOSIS — H31093 Other chorioretinal scars, bilateral: Secondary | ICD-10-CM | POA: Diagnosis not present

## 2023-02-11 DIAGNOSIS — H04123 Dry eye syndrome of bilateral lacrimal glands: Secondary | ICD-10-CM | POA: Diagnosis not present

## 2023-02-11 DIAGNOSIS — H43812 Vitreous degeneration, left eye: Secondary | ICD-10-CM | POA: Diagnosis not present

## 2023-02-11 MED ORDER — RIVASTIGMINE TARTRATE 4.5 MG PO CAPS
4.5000 mg | ORAL_CAPSULE | Freq: Two times a day (BID) | ORAL | 5 refills | Status: DC
  Filled 2023-04-06: qty 60, 30d supply, fill #0
  Filled 2023-05-05: qty 60, 30d supply, fill #1
  Filled 2023-06-03 – 2023-06-04 (×2): qty 60, 30d supply, fill #2
  Filled 2023-06-28: qty 60, 30d supply, fill #3

## 2023-02-15 ENCOUNTER — Telehealth: Payer: Self-pay

## 2023-02-15 NOTE — Progress Notes (Signed)
   02/15/2023  Patient ID: [name], male   DOB: 09-06-1950, 72 y.o.   MRN: [id number]  Patient outreach in response to voicemail left on Friday regarding affordability of recently prescribed Rhopressa.  Medication was prescribed for glaucoma, but is very expensive on patient's Medicare Part D plan.  There is a patient assistance program, but they would no qualify based on household income.  There currently are no disease state grants open that could assist, and copay card is only for patient's with commercial insurance.  Patient's wife is going to contact prescriber about alternatives and will contact me if needed to look into assistance programs for these.  Lenna Gilford, PharmD, DPLA

## 2023-02-16 ENCOUNTER — Telehealth: Payer: Self-pay

## 2023-02-16 ENCOUNTER — Ambulatory Visit (HOSPITAL_COMMUNITY)
Admission: RE | Admit: 2023-02-16 | Discharge: 2023-02-16 | Disposition: A | Discharge status: Home / Self Care | Payer: Medicare Other | Source: Ambulatory Visit | Attending: Adult Health | Admitting: Adult Health

## 2023-02-16 DIAGNOSIS — I700 Atherosclerosis of aorta: Secondary | ICD-10-CM

## 2023-02-16 DIAGNOSIS — K5730 Diverticulosis of large intestine without perforation or abscess without bleeding: Secondary | ICD-10-CM | POA: Diagnosis not present

## 2023-02-16 DIAGNOSIS — N281 Cyst of kidney, acquired: Secondary | ICD-10-CM | POA: Diagnosis not present

## 2023-02-16 MED ORDER — IOHEXOL 350 MG/ML SOLN
100.0000 mL | Freq: Once | INTRAVENOUS | Status: AC | PRN
  Administered 2023-02-16: 100 mL via INTRAVENOUS

## 2023-02-16 NOTE — Telephone Encounter (Signed)
Order entered for a CT angio of abdomen and pelvis.

## 2023-02-17 ENCOUNTER — Encounter: Payer: Self-pay | Admitting: Family Medicine

## 2023-02-22 ENCOUNTER — Telehealth: Payer: Self-pay

## 2023-02-22 NOTE — Telephone Encounter (Addendum)
Results viewed by patient via MyChart.----- Message from Joni Reining sent at 02/17/2023  7:07 AM EDT ----- No evidence of aortic aneurysm on the views of the abdomen and pelvis. He has plaque in his inferior mesenteric artery, but is not worrisome unless he is having abdominal pain after eating.   KL

## 2023-02-23 ENCOUNTER — Encounter: Payer: Self-pay | Admitting: Family Medicine

## 2023-03-02 ENCOUNTER — Other Ambulatory Visit: Payer: Self-pay | Admitting: Family Medicine

## 2023-03-02 NOTE — Telephone Encounter (Signed)
Is this okay to refill>? 

## 2023-03-08 ENCOUNTER — Other Ambulatory Visit (HOSPITAL_COMMUNITY): Payer: Self-pay

## 2023-03-09 ENCOUNTER — Other Ambulatory Visit (HOSPITAL_COMMUNITY): Payer: Self-pay

## 2023-03-10 ENCOUNTER — Other Ambulatory Visit (HOSPITAL_COMMUNITY): Payer: Self-pay

## 2023-03-11 ENCOUNTER — Other Ambulatory Visit (HOSPITAL_COMMUNITY): Payer: Self-pay

## 2023-03-11 ENCOUNTER — Encounter: Payer: Self-pay | Admitting: Adult Health

## 2023-03-11 DIAGNOSIS — Z961 Presence of intraocular lens: Secondary | ICD-10-CM | POA: Diagnosis not present

## 2023-03-11 DIAGNOSIS — H01024 Squamous blepharitis left upper eyelid: Secondary | ICD-10-CM | POA: Diagnosis not present

## 2023-03-11 DIAGNOSIS — H34832 Tributary (branch) retinal vein occlusion, left eye, with macular edema: Secondary | ICD-10-CM | POA: Diagnosis not present

## 2023-03-11 DIAGNOSIS — H04123 Dry eye syndrome of bilateral lacrimal glands: Secondary | ICD-10-CM | POA: Diagnosis not present

## 2023-03-11 DIAGNOSIS — H35372 Puckering of macula, left eye: Secondary | ICD-10-CM | POA: Diagnosis not present

## 2023-03-11 DIAGNOSIS — H4720 Unspecified optic atrophy: Secondary | ICD-10-CM | POA: Diagnosis not present

## 2023-03-11 DIAGNOSIS — H01021 Squamous blepharitis right upper eyelid: Secondary | ICD-10-CM | POA: Diagnosis not present

## 2023-03-11 DIAGNOSIS — H31093 Other chorioretinal scars, bilateral: Secondary | ICD-10-CM | POA: Diagnosis not present

## 2023-03-11 DIAGNOSIS — H401132 Primary open-angle glaucoma, bilateral, moderate stage: Secondary | ICD-10-CM | POA: Diagnosis not present

## 2023-03-11 DIAGNOSIS — H34812 Central retinal vein occlusion, left eye, with macular edema: Secondary | ICD-10-CM | POA: Diagnosis not present

## 2023-03-11 DIAGNOSIS — H43812 Vitreous degeneration, left eye: Secondary | ICD-10-CM | POA: Diagnosis not present

## 2023-03-11 MED ORDER — RHOPRESSA 0.02 % OP SOLN
1.0000 [drp] | Freq: Every evening | OPHTHALMIC | 11 refills | Status: DC
  Filled 2023-03-11 – 2023-06-05 (×3): qty 2.5, 50d supply, fill #0
  Filled 2023-06-08: qty 2.5, 30d supply, fill #0

## 2023-03-11 MED ORDER — LATANOPROST 0.005 % OP SOLN
1.0000 [drp] | Freq: Every evening | OPHTHALMIC | 4 refills | Status: DC
  Filled 2023-04-05: qty 7.5, 75d supply, fill #0

## 2023-03-11 MED ORDER — DORZOLAMIDE HCL-TIMOLOL MAL 2-0.5 % OP SOLN: 1.0000 [drp] | Freq: Two times a day (BID) | OPHTHALMIC | 10 refills | Status: DC

## 2023-03-11 MED ORDER — ALFUZOSIN HCL ER 10 MG PO TB24: 10.0000 mg | ORAL_TABLET | Freq: Every evening | ORAL | 3 refills | Status: DC

## 2023-03-11 MED ORDER — CARBIDOPA-LEVODOPA 25-100 MG PO TABS
ORAL_TABLET | ORAL | 3 refills | Status: DC
  Filled 2023-04-06: qty 135, 30d supply, fill #0

## 2023-03-11 MED ORDER — DORZOLAMIDE HCL-TIMOLOL MAL 2-0.5 % OP SOLN
1.0000 [drp] | Freq: Two times a day (BID) | OPHTHALMIC | 11 refills | Status: DC
  Filled 2023-03-11 – 2023-07-24 (×2): qty 10, 50d supply, fill #0
  Filled 2023-09-07: qty 10, 25d supply, fill #1
  Filled 2023-09-07 (×2): qty 10, 50d supply, fill #1
  Filled 2023-10-27: qty 10, 50d supply, fill #2

## 2023-03-16 ENCOUNTER — Other Ambulatory Visit (HOSPITAL_COMMUNITY): Payer: Self-pay

## 2023-03-18 ENCOUNTER — Other Ambulatory Visit (HOSPITAL_COMMUNITY): Payer: Self-pay

## 2023-03-18 MED ORDER — IBUPROFEN 600 MG PO TABS
600.0000 mg | ORAL_TABLET | Freq: Three times a day (TID) | ORAL | 3 refills | Status: DC | PRN
  Filled 2023-03-18: qty 60, 20d supply, fill #0

## 2023-03-19 ENCOUNTER — Other Ambulatory Visit (HOSPITAL_COMMUNITY): Payer: Self-pay

## 2023-03-23 ENCOUNTER — Other Ambulatory Visit (HOSPITAL_COMMUNITY): Payer: Self-pay

## 2023-03-23 ENCOUNTER — Other Ambulatory Visit (HOSPITAL_BASED_OUTPATIENT_CLINIC_OR_DEPARTMENT_OTHER): Payer: Self-pay

## 2023-03-23 ENCOUNTER — Telehealth: Payer: Self-pay | Admitting: Family Medicine

## 2023-03-23 MED ORDER — PANTOPRAZOLE SODIUM 40 MG PO TBEC
40.0000 mg | DELAYED_RELEASE_TABLET | Freq: Two times a day (BID) | ORAL | 2 refills | Status: DC
  Filled 2023-03-23: qty 180, 90d supply, fill #0
  Filled 2023-04-06: qty 60, 30d supply, fill #0
  Filled 2023-05-05: qty 60, 30d supply, fill #1
  Filled 2023-06-03 – 2023-06-04 (×2): qty 60, 30d supply, fill #2
  Filled 2023-06-28: qty 60, 30d supply, fill #3
  Filled 2023-07-14 – 2023-07-22 (×2): qty 60, 30d supply, fill #4
  Filled 2023-08-16 – 2023-08-19 (×3): qty 60, 30d supply, fill #5
  Filled 2023-09-06 – 2023-09-21 (×4): qty 60, 30d supply, fill #6
  Filled 2023-09-30 – 2023-10-18 (×4): qty 60, 30d supply, fill #7
  Filled 2023-11-01 – 2023-11-11 (×2): qty 60, 30d supply, fill #8
  Filled ????-??-??: fill #7

## 2023-03-23 MED ORDER — CITALOPRAM HYDROBROMIDE 20 MG PO TABS
20.0000 mg | ORAL_TABLET | Freq: Every evening | ORAL | 1 refills | Status: DC
  Filled 2023-03-23: qty 90, 90d supply, fill #0
  Filled 2023-04-06: qty 30, 30d supply, fill #0
  Filled 2023-05-05: qty 30, 30d supply, fill #1
  Filled 2023-06-03 – 2023-06-04 (×2): qty 30, 30d supply, fill #2
  Filled 2023-06-28: qty 30, 30d supply, fill #3
  Filled 2023-07-14 – 2023-07-22 (×2): qty 30, 30d supply, fill #4
  Filled 2023-08-16 – 2023-08-19 (×3): qty 30, 30d supply, fill #5

## 2023-03-23 MED ORDER — ALFUZOSIN HCL ER 10 MG PO TB24
10.0000 mg | ORAL_TABLET | Freq: Every evening | ORAL | 3 refills | Status: DC
  Filled 2023-03-23: qty 90, 90d supply, fill #0
  Filled 2023-04-06: qty 30, 30d supply, fill #0
  Filled 2023-05-05: qty 30, 30d supply, fill #1
  Filled 2023-06-03 – 2023-06-04 (×2): qty 30, 30d supply, fill #2
  Filled 2023-06-28: qty 30, 30d supply, fill #3
  Filled 2023-07-14 – 2023-07-22 (×2): qty 30, 30d supply, fill #4
  Filled 2023-08-16 – 2023-09-02 (×6): qty 30, 30d supply, fill #5

## 2023-03-23 MED ORDER — VERAPAMIL HCL ER 120 MG PO TBCR
120.0000 mg | EXTENDED_RELEASE_TABLET | Freq: Every morning | ORAL | 3 refills | Status: DC
  Filled 2023-03-23: qty 90, 90d supply, fill #0
  Filled 2023-04-06: qty 30, 30d supply, fill #0
  Filled 2023-05-05: qty 30, 30d supply, fill #1
  Filled 2023-06-03 – 2023-06-04 (×2): qty 30, 30d supply, fill #2
  Filled 2023-06-28: qty 30, 30d supply, fill #3
  Filled 2023-07-14 – 2023-07-22 (×2): qty 30, 30d supply, fill #4
  Filled 2023-08-16 – 2023-08-19 (×3): qty 30, 30d supply, fill #5
  Filled 2023-09-06 – 2023-09-21 (×4): qty 30, 30d supply, fill #6
  Filled 2023-09-30 – 2023-10-18 (×4): qty 30, 30d supply, fill #7
  Filled 2023-11-01 – 2023-11-11 (×2): qty 30, 30d supply, fill #8
  Filled 2023-11-29 – 2023-12-04 (×2): qty 30, 30d supply, fill #9
  Filled 2023-12-27 – 2024-01-04 (×3): qty 30, 30d supply, fill #10
  Filled ????-??-??: fill #7

## 2023-03-23 NOTE — Telephone Encounter (Signed)
[facility] called they would like something sent to them with an updated medication list because he is new there. Provided call back number [phone]

## 2023-03-24 ENCOUNTER — Other Ambulatory Visit (HOSPITAL_COMMUNITY): Payer: Self-pay

## 2023-04-01 ENCOUNTER — Other Ambulatory Visit: Payer: Self-pay

## 2023-04-03 ENCOUNTER — Other Ambulatory Visit: Payer: Self-pay

## 2023-04-05 ENCOUNTER — Encounter: Payer: Self-pay | Admitting: Adult Health

## 2023-04-05 ENCOUNTER — Other Ambulatory Visit: Payer: Self-pay

## 2023-04-05 ENCOUNTER — Encounter: Payer: Self-pay | Admitting: Family Medicine

## 2023-04-05 ENCOUNTER — Other Ambulatory Visit (HOSPITAL_COMMUNITY): Payer: Self-pay

## 2023-04-05 ENCOUNTER — Ambulatory Visit: Payer: Medicare Other | Admitting: Adult Health

## 2023-04-05 ENCOUNTER — Other Ambulatory Visit: Payer: Self-pay | Admitting: Family Medicine

## 2023-04-05 VITALS — BP 134/92 | HR 64 | Ht 70.0 in | Wt 151.0 lb

## 2023-04-05 DIAGNOSIS — G4752 REM sleep behavior disorder: Secondary | ICD-10-CM

## 2023-04-05 DIAGNOSIS — G20C Parkinsonism, unspecified: Secondary | ICD-10-CM | POA: Diagnosis not present

## 2023-04-05 DIAGNOSIS — Z961 Presence of intraocular lens: Secondary | ICD-10-CM | POA: Diagnosis not present

## 2023-04-05 DIAGNOSIS — H01024 Squamous blepharitis left upper eyelid: Secondary | ICD-10-CM | POA: Diagnosis not present

## 2023-04-05 DIAGNOSIS — H348121 Central retinal vein occlusion, left eye, with retinal neovascularization: Secondary | ICD-10-CM | POA: Diagnosis not present

## 2023-04-05 DIAGNOSIS — R351 Nocturia: Secondary | ICD-10-CM

## 2023-04-05 DIAGNOSIS — H01021 Squamous blepharitis right upper eyelid: Secondary | ICD-10-CM | POA: Diagnosis not present

## 2023-04-05 DIAGNOSIS — H18231 Secondary corneal edema, right eye: Secondary | ICD-10-CM | POA: Diagnosis not present

## 2023-04-05 DIAGNOSIS — H35372 Puckering of macula, left eye: Secondary | ICD-10-CM | POA: Diagnosis not present

## 2023-04-05 DIAGNOSIS — H401132 Primary open-angle glaucoma, bilateral, moderate stage: Secondary | ICD-10-CM | POA: Diagnosis not present

## 2023-04-05 DIAGNOSIS — H43812 Vitreous degeneration, left eye: Secondary | ICD-10-CM | POA: Diagnosis not present

## 2023-04-05 DIAGNOSIS — R400 Somnolence: Secondary | ICD-10-CM | POA: Diagnosis not present

## 2023-04-05 DIAGNOSIS — H04123 Dry eye syndrome of bilateral lacrimal glands: Secondary | ICD-10-CM | POA: Diagnosis not present

## 2023-04-05 DIAGNOSIS — H4720 Unspecified optic atrophy: Secondary | ICD-10-CM | POA: Diagnosis not present

## 2023-04-05 DIAGNOSIS — H31093 Other chorioretinal scars, bilateral: Secondary | ICD-10-CM | POA: Diagnosis not present

## 2023-04-05 MED ORDER — IRBESARTAN 75 MG PO TABS
75.0000 mg | ORAL_TABLET | Freq: Every morning | ORAL | 5 refills | Status: DC
  Filled 2023-04-05 – 2023-04-06 (×2): qty 30, 30d supply, fill #0
  Filled 2023-05-05: qty 30, 30d supply, fill #1
  Filled 2023-06-03 – 2023-06-04 (×2): qty 30, 30d supply, fill #2
  Filled 2023-06-28: qty 30, 30d supply, fill #3
  Filled 2023-07-14 – 2023-07-22 (×2): qty 30, 30d supply, fill #4
  Filled 2023-08-16 – 2023-08-19 (×3): qty 30, 30d supply, fill #5

## 2023-04-05 MED ORDER — ROCKLATAN 0.02-0.005 % OP SOLN
1.0000 [drp] | Freq: Every evening | OPHTHALMIC | 3 refills | Status: DC
  Filled 2023-04-05: qty 5, 50d supply, fill #0
  Filled 2023-09-16 – 2023-09-17 (×2): qty 2.5, 50d supply, fill #0

## 2023-04-05 NOTE — Patient Instructions (Addendum)
Your Plan:  Continue Sinemet 1.5 tab three times daily   Continue Artane 0.5 tab twice daily  You will be called to be evaluated by one of our sleep specialist to evaluate for possible sleep apnea  Please let me know if you would like to participate in physical therapy for parkinson's   Continue Exelon 4.5mg  twice daily for memory       Follow up in 6 months or call earlier if needed      Thank you for coming to see [place] at [facility]. I hope we have been able to provide you high quality care today.  You may receive a patient satisfaction survey over the next few weeks. We would appreciate your feedback and comments so that we may continue to improve ourselves and the health of our patients.

## 2023-04-05 NOTE — Progress Notes (Signed)
[facility] PATIENT: [name] DOB: 15-Feb-1951   REASON FOR VISIT: Follow-up for tremor mild cognitive impairment, depression HISTORY FROM: Patient and wife   Chief complaint: Routine follow-up visit    HISTORY OF PRESENT ILLNESS:HISTORY:63 year African American male who since last year and a half has noticed increasing tremors mainly in the left arm and leg and to a lesser degree in the right arm as well. He states the tremors were quite mild but became more pronounced after a minor accident while staying [place] the [place]. He fell down 12 flights of steps and hit a concrete slab and had a concussion. He and some minor bruises and knee injury which took several months to reck of her period CT scan of the head was done 2 days later which was unremarkable. Since then he has had some walking difficulty but this may be related to his knee pain. He says that his feet do at times gets stuck in his started walking with a stooped posture. He however does not describe typical festination. He has resting and intermittent action tremor claiming the left arm and leg the tremor does improve with activities. The tremor does not seem to interfere with most of his routine. He denies significant bradykinesia, and drooling of saliva or micrographia. He has been evaluated by neurologists Dr. Rich Number at [facility] but he is unable to tell me for the diagnosis was. He was not told that this may be Parkinson's and was not tried on dopaminergic medications. He did have an MRI scan and some lab work but I do not have those results for my review available today. Patient is here today for a second opinion as he feels his tremor is not getting better. He does admit to some light masonry hallucinations as well as restless sleep thrashing of his legs. He denies significant memory or cognitive difficulties. He has not noticed any particular effect of alcohol on  his tremor. Is no family history of tremors. Patient does have chronic hepatitis C and is planning to start treatment with dapsone. He has had no seizures, significant head injury with major loss of consciousness, stroke, TIAs or significant neurological problems.  Update 08/14/2014 : He returns for follow-up after last visit 3 months ago. He feels his tremors are about the same. He had tried reducing the dose of Sinemet but noticed that his tremors got worse and hence he went back up to the original dose which is 25/100 one tablet 3 times daily. He still feels occasionally confused and at times secondary to some cells but he admits that he worries a lot. He also admits to feeling depressed, getting tired easily and not having initiated. He has not been on any medications for depression. Patient denies significant drooling of saliva, bradykinesia, gait or balance problems. He feels his tremor is mild and does not interfere with his activities of daily living. I discussed alternative treatment options including addition of dopamine agonist, anticholinergic or even consideration for deep brain  stimulation since his tremor is predominantly unilateral however the patient does not want to consider more aggressive treatment options at the present time. UPDATE 12/26/14 [name], 72 year old black male returns for follow-up. He was last seen by Dr. Pearlean Brownie 08/14/2014. He feels his tremors are better since increasing his carbidopa levodopa to 1-1/2 tablets 3 times daily. He is tolerating it well without dizziness, sleepiness, nightmares or hallucinations A MRI Brain with and without Contrast completed on  07/29/2011 was normal.  The Mini-Mental status exam today is unchanged 29 out of 30. He did not follow-up with his primary care for  complaints of depression. He denies any significant drooling bradykinesia falls or balance problems. His tremor does not interfere with his activities of daily living. He returns for  reevaluation Update 07/03/2015 : He returns for follow-up after last visit 6 months ago. He states his tremors continue to do well and he seems to have responded quite well to increased dose of Sinemet 25/100 1-1/2 tablet 3 times daily. Is tolerating it well without any dizziness, sleepiness, nightmares, hallucinations, nausea or diarrhea. He continues to have mild memory difficulties but his has not been on any treatment for depression and in fact has not seen his primary care physician for the same for quite some time. Update 08/11/2016 PS : He returns for follow-up after last visit year ago. He states his tremors as well as memory loss or more or less unchanged. Continues to have intermittent tremors in the left hand mainly. The tremors fluctuate he has good days and bad days. He is currently taking Sinemet 25/100 one and half tablet 3 times daily. He complains of mild fatigue and daytime sleepiness. He denies hallucinations, delusions, dizziness or upset stomach. He does not want to try higher dose because of fear of side effects. Continues to have short-term memory difficulties. He has not been quite compulsive about taking notes and using memory compensation strategies but plans to do so. He has no new complaints today. UPDATE 07/19/2018CM [name], 72 year old male returns for follow-up with tremors and mild cognitive impairment which he says is stable. He continues to have a resting tremor in the left hand which is intermittent. He continues to have good and bad days. He remains on Sinemet 25 100 (1 and half tablets) 3 times daily. He does little exercise he is not doing any memory compensation strategies at this time and was encouraged to do so. He denies any falls. He denies any hallucinations or increased confusion. He denies any freezing spells. He does complain of left hip pain He returns for reevaluation UPDATE 1/22/2019CM [name], 47 he denies 16-year-old male returns for follow-up with a history  of Parkinson's disease.  He continues to have mild resting tremor in the left hand which is intermittent.  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  He remains on Sinemet 3 times a day however he is taking his medication with a meal.  He complains of sometimes being lightheaded when he stands up too quickly.  He was encouraged to sit on the side of the bed and then get up slightly.  He was also encouraged to stay well-hydrated.  He has not been exercising.  He denies any hallucinations or increased confusion.  He has not had any freezing spells.  He returns for reevaluation 02/15/18 UPDATE:JV  Patient returns today for six-month follow-up.  He continues to take Sinemet 3 times daily with the first dose around 6 AM (1 hour prior to eating breakfast), second dose 11 AM (1 hour prior to eating lunch) and then third dose between 5:30 and 6 PM (eats dinner around 6:30-7 PM).  Patient denies any increase in symptoms between doses but does state if he is late on a dose, he will start having increased tremors in his left upper extremity and left lower extremity.  He continues to complain of short-term memory issue and is unable to state whether it is worsening but he  states he notices it more.  He did obtain a large book full avoid games but does this approximately 1 time per week.  Recommended to do these activities at least once daily.  Patient also has complaints of feeling as though he overanalyzes certain situations and feeling increased fatigue.  Patient does endorse increased stress recently but does feel somewhat situational.  Provided patient with stress relaxation techniques and advised him if this continues, to follow-up with PCP for possible medication management.  He denies any hallucinations or increased confusion.  He denies any freezing spells. UPDATE 1/29/2020CM [name], 72 year old male returns for follow-up with a history of Parkinson's disease and mild cognitive impairment.  He denies any  freezing spells any stiffness any falls.  He denies any difficulty swallowing.  He has spinal stenosis and had recent injection.  He is seeing Dr. Lajoyce Corners for a foot problem.  He is currently on Sinemet 3 times a day before meals.  He has not been exercising and was encouraged to do so.  He has not had any hallucinations or increased confusion.  Noted very intermittent left tremor of the hand.  Patient reports some anxiety in public.  He was encouraged to perform stress relaxation techniques.  He returns for reevaluation  : Update 02/23/2019 :  he returns for follow-up after last visit 6 months ago.  He states his tremors are doing well until a month ago when he is noticed worsening of his tremor both at rest as well as using his hands.  He remains on Sinemet 25/100 mg 1-1/2 tablet 3 times daily.  Is tolerating them well without any nausea, diarrhea, dizziness, sleepiness or hallucinations.  He states his short-term memory difficulties are unchanged.  He has not been socializing a lot or being active but plans to do that soon.  He has no other complaints.  He denies significant drooling, bradykinesia, stooped posture gait or balance problems Update 09/04/2019 : He returns for follow-up after last visit 6 months ago.  He states that his tremors continue to intermittently not do well.  He has no more bad days than good days.  Has noticed some tremor in his legs as well intermittently.  Patient was advised to try higher dose of Sinemet 2 tablets 3 times daily at last visit.  He states he was not able to tolerate this because of dizziness and had to cut back to 1-1/2 3 times daily which is tolerating better.  He is also noticed that his balance is off but he blames this on having some problems in his toes.  He has had no major falls or injuries.  He can still get up easily from his chair.  He denies significant drooling of saliva.  He does have some at times disturbed sleep and reviewed dreams and talks in his sleep.  Gets  startled easily. Update 01/10/2020 : He returns for follow-up after last visit 4 months ago.  Is accompanied by his wife.  The patient was started on Azilect at last visit but had trouble tolerating it daily due to sleepiness, tiredness and dizziness.  He has since switched to using Azilect 0.5 mg twice daily every other day and seems to be tolerating it better.  However his tremors still persists and appear to get worse mostly when he is anxious or with severe exertion.  He feels the tremor are not particularly disabling and he can handle it.  Continues to have poor short-term memory and needs to write things down  but still remembers only occasionally.  Sometimes he may remember later.  Wife notes that occasionally he may pause in midsentence and searches for words.  At times even starts stuttering as he is trying to find the word.  He has no family history of Alzheimer's or dementia.  Still fairly independent and manages most activities for himself.  He remains currently on Sinemet 25/100 1.5 tablets 4 times daily which is tolerating well without significant nightmares, hallucinations, daytime sleepiness or nausea. Update 06/28/2020 : He returns for follow-up after last visit 6 months ago.  He is accompanied by his wife.  He continues to have intermittent tremors mostly in the left hand which are bothersome and he has good days and bad days.  Patient still continues to take Artane 2 mg tablets only half a tablet twice daily on alternate days and not every day as instructed at last visit.  He remains on Sinemet 25/100 mg 1.5 tablets 3 times daily and he has had trouble tolerating higher doses in the past.  Is not having significant dizziness or upset stomach but is still having some disturbed sleep with crazy dreams.  He denies hallucinations or delusions.  He is careful with his walking and has had a few near falls but no major falls or injuries.  He denies excessive drooling of saliva or bradykinesia or mobility  issues.  He does admit to decreased short-term memory, attention and concentration difficulties but these appear to be stable and unchanged. Update 10/01/2020 Dr. Pearlean Brownie: He returns for follow-up after last visit 3 months ago.  Patient did not tolerate increasing the dose of Artane to 2 mg daily as he developed increased dryness of mouth as well as bad dreams.  He has since reduced the dose back to 1 mg daily and is feeling better.  He still has intermittent resting tremor in both left hand as well as leg which does interfere with his activities of daily living slightly.  He remains on Sinemet 25/100 mg 1.5 tablets 3 times daily which is tolerating quite well without any dizziness, sleepiness, upset stomach, nightmares or hallucinations.  He notices occasional lip twitchings in the morning only.  He has no bradykinesia or increased drooling.  He can get up easily and to most of his activities without help.  He continues to have short-term memory problems which appear unchanged.  He forgets recent appointments and events.  He states he has decreased hearing bilaterally which is also seems to complicate his understanding and memory.  He is however quite independent in activities of daily living.  Update 04/07/2021 JM: Returns for 39-month follow-up after prior visit with Dr. Pearlean Brownie.  He is accompanied by his wife, Britta Mccreedy. Since prior visit, was hopitalized back in June for GI bleed.  He developed acute confusion most likely hospital delirium requiring short course of Precedex. Per wife, she is concerned regarding worsening cognition with short term memory and delayed recall since that time as well as multiple other concerns including worsening lower back pain, decreased hearing and vision and chills/coldness sensation in both feet and hands. Tremors have been stable with ongoing use of Sinemet and Artane.  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 Continues to struggle with short-term memory and recall which he feels has continued to gradually, slowly decline. Examples he gives includes walking into a room and forget why he went into room or repeat similar questions. Did recently  receive hearing aide for left ear and continued use of hearing aide in right ear. At times he will feel like he is in a "fog" typically associated with lightheadedness, is aware these are happening, does not lose consciousness. Per wife, this has been improving as she has been trying to encourage him to drink more water.  Will feel down or depressed due to his memory issues but otherwise denies depression. Does do memory exercises occasionally at home.  Is currently doing PT after back surgery - does report great improvement of pain since procedure. Did have post anesthesia delirium requiring short-term use of Seroquel but this has since resolved. Wife is concerned as he needs to have surgical procedure for umbilical hernia and fears recurrence of delirium. No behavioral concerns such as hallucinations, agitation, sleep difficulty, or confusion.  Remains on Cerefolin but does not believe it has been that beneficial. MMSE today 27/30 (prior 26/30)  Tremors stable with ongoing use of Sinemet and Artane, tolerating without side effects. Denies any issues with drooling, freezing, gait impairement, swallowing difficulties or bradykinesia.  No further concerns at this time   Update 04/09/2022 JM: Patient returns for 45-month follow-up.  Cognition: Has been stable since prior visit He has remained on Aricept 10 mg nightly, denies side effects Difficulties with short term memory, can forget certain tasks he is supposed to be doing, at times will be able to recall but sometime next will need to ask wife to remind him.  He does endorse some underlying anxiety, can have difficulty making decisions on his own such as what he should be wearing that day.   Parkinsonian  tremor: Remains on sinmet, occasionally misses dosage, currently prescribed taking 3 times per day, tries to remember to take at 6:30am, 12pm and 5:30-6pm. Unsure if he is having issues with wearing off but wife has told him symptoms can worsen prior to next dosage Also remains on Artane half tablet twice daily Can have slowness of movements and gait at times, does not use assistive device, denies any recent falls  Update 10/12/2022 JM: Patient returns for 38-month follow-up accompanied by his wife.  Reports gradual cognitive decline over the past 6 months more so with short term memory (recall). Wife also mentions issues with hearing and vision, at times will forget to put in hearing aides which she believes has been contributing some. Wife notes increased difficulty with focus and follow through with tasks which has gradually worsened since prior visit but patient admits to mind racing/wandering and then can easily forget what he is supposed to be doing. MMSE today 23/30 (prior 27/30 1 yr ago). Continues on Aricept 10 mg nightly. Wife questions other medications to help with memory.   Remains on Sinemet 1.5 tab TID and Artane 1/2 tab BID for parkinsonian tremor, will miss Sinemet dose occasionally - typically takes at 6:45am, 12:30-1:30pm,6-7pm, avoids taking with meals.  Tremor has been relatively stable but can fluctuate. Some more issues with gait/balance but also has chronic right foot issue that has been gradually worsening as well as gradually worsening low back pain, is waiting to be seen by neurosurgeon Dr. Yetta Barre for further input. This has been affecting quality of life as he is not able to be as active as he wants to be. Doing aquatic therapy which has been beneficial especially in regards to energy levels.    Update 04/05/2023 JM: Patient returns for follow-up visit for MCI and parkinsonian tremor accompanied by his wife.   Continued left arm  tremor, wife has noticed some tremor in LLE. Not  overly bothersome, can fluctuate.  Continued imbalance, denies worsening, continues to have right foot pain which he feels has contributed, at times can drag his feet, very slow walking, no use of AD, no recent falls.  Did aquatic therapy for low back pain which was helpful, continues to do exercises at home.  He also recently started chair yoga and tai chi. Wife mentions fine motor movement overall slower, takes him longer to do things such as getting ready in the morning or doing certain activities. Able to maintain ADLs independently, does help with some IADLs such as cooking and cleaning.  Remains on Sinemet 1.5 tab TID and Artane 0.5 tab BID. Believes tolerating without side effects, has not noticed any wearing off symptoms but will notice worsening of symptoms if he forget a dose. Takes Sinemet at 6:45am, 12pm and 6pm. Usually wakes up at 6:45am to take the dog out and takes Sinemet dosage, will go back to bed and sleep for additional hour. Bedtime usually around 10-11pm but sometimes later, reports nocturia 3-4 times per night. Wife notes he does snore, will talk in his sleep and act out dreams (kicking and punching), wife no longer sleeps in same room. Does mention while doing different projects, watching TV or reading, his eyes will randomly close, wife will ask him if he is sleeping and is able to open his eyes, feels due to daytime fatigue, denies double vision or vision changes. Memory has been overall stable, wife has noticed taking longer to respond to questions or difficulty making decision, needs to have choices, will easily forget what he is supposed to be doing. Was switched from Aricept to Exelon prior visit.  Since prior visit, Exelon dosage gradually increased currently taking 4.5 mg BID tolerating without side effects, previously unable to tolerate 6mg  AM and 3mg  PM dosing. MMSE today 30/30 (prior 23/30).       REVIEW OF SYSTEMS: Full 14 system review of systems performed and notable only  for those listed in HPI only and all other systems negative   ALLERGIES: Allergies  Allergen Reactions   Anesthesia S-I-40 [Propofol] Other (See Comments)   Aspirin Other (See Comments)   Other Other (See Comments)    Severe delirium with anesthesia per spouse enhances parkinsons symptoms    HOME MEDICATIONS: Outpatient Medications Prior to Visit  Medication Sig Dispense Refill   acetaminophen (TYLENOL) 500 MG tablet Take 2 tablets (1,000 mg total) by mouth every 6 (six) hours as needed for mild pain.     alfuzosin (UROXATRAL) 10 MG 24 hr tablet Take 1 tablet (10 mg total) by mouth at bedtime. 90 tablet 3   carbidopa-levodopa (SINEMET IR) 25-100 MG tablet Take 1&1/2 tablets by mouth before breakfast,  at noon and every evening as directed 405 tablet 3   citalopram (CELEXA) 20 MG tablet Take 1 tablet (20 mg total) by mouth at bedtime. 90 tablet 1   dorzolamide-timolol (COSOPT) 2-0.5 % ophthalmic solution Place 1 drop into both eyes 2 (two) times daily. 10 mL 11   dorzolamide-timolol (COSOPT) 22.3-6.8 MG/ML ophthalmic solution Instill 1 drop into both eyes twice a day 10 mL 11   FIBER ADULT GUMMIES PO Take 1 tablet by mouth 2 (two) times daily.     ibuprofen (ADVIL) 600 MG tablet Take 1 tablet (600 mg total) by mouth every 8 (eight) hours as needed for moderate pain. 60 tablet 1   ibuprofen (ADVIL) 600 MG tablet Take  1 tablet (600 mg total) by mouth every 8 (eight) hours as needed for moderate pain. 60 tablet 3   irbesartan (AVAPRO) 75 MG tablet Take 1 tablet (75 mg total) by mouth every morning. 30 tablet 5   Multiple Vitamins-Minerals (MULTIVITAMIN ADULTS 50+ PO) Take 1 tablet by mouth daily.     Netarsudil Dimesylate (RHOPRESSA) 0.02 % SOLN Place 1 drop into the left eye every evening. 2.5 mL 11   nitroGLYCERIN (NITROSTAT) 0.4 MG SL tablet Place 1 tablet (0.4 mg total) under the tongue every 5 (five) minutes as needed for chest pain. 25 tablet 3   pantoprazole (PROTONIX) 40 MG tablet  Take 1 tablet (40 mg total) by mouth 2 (two) times daily. 180 tablet 2   Pitavastatin Calcium (LIVALO) 2 MG TABS TAKE ONE TABLET BY MOUTH EVERY OTHER DAY 45 tablet 0   rivastigmine (EXELON) 4.5 MG capsule Take 1 capsule (4.5 mg total) by mouth 2 (two) times daily. 60 capsule 5   trihexyphenidyl (ARTANE) 2 MG tablet TAKE 1/2 TABLET BY MOUTH TWICE DAILY WITH A MEAL 30 tablet 5   verapamil (CALAN-SR) 120 MG CR tablet Take 1 tablet (120 mg total) by mouth every morning. 90 tablet 3   alfuzosin (UROXATRAL) 10 MG 24 hr tablet Take 10 mg by mouth daily.     carbidopa-levodopa (SINEMET IR) 25-100 MG tablet Take 1.5 tablets by mouth 3 (three) times daily. 405 tablet 3   dorzolamide-timolol (COSOPT) 2-0.5 % ophthalmic solution Place 1 drop into both eyes 2 (two) times daily. 10 mL 10   ibuprofen (ADVIL) 600 MG tablet Take 1 tablet by mouth 3 times daily as needed. 90 tablet 2   irbesartan (AVAPRO) 75 MG tablet TAKE ONE TABLET BY MOUTH EVERY MORNING 90 tablet 0   acetaminophen-codeine (TYLENOL #3) 300-30 MG tablet Take 1 tablet by mouth 3 times daily for 5 days (Patient not taking: Reported on 02/09/2023) 15 tablet 0   Netarsudil-Latanoprost (ROCKLATAN) 0.02-0.005 % SOLN Place 1 drop into the left eye every evening. (Patient not taking: Reported on 04/05/2023) 5 mL 3   timolol (TIMOPTIC) 0.5 % ophthalmic solution Instill 1 drop into both eyes every morning (Patient not taking: Reported on 04/05/2023) 30 mL 4   No facility-administered medications prior to visit.    PAST MEDICAL HISTORY: Past Medical History:  Diagnosis Date   Allergy    seasonal   Arthritis    BPH (benign prostatic hyperplasia)    Complication of anesthesia    Pt. stated he had a reaction that ended in him requiring urinary cath placement; hx delirium worsening parkinson symptoms   Diverticulosis    GERD (gastroesophageal reflux disease)    esophageal spasms   Glaucoma    Gout    Head injury, closed, with concussion    Hepatitis C     chronic - Has been treated with Harvoni   HLD (hyperlipidemia)    statin intolerant (Crestor & Simvastatin) - Taking Livalo 1mg  / week   Hypertension    Parkinson's disease    Plantar fasciitis    right   PVD (peripheral vascular disease) (HCC)    With no claudication; only mild abdominal aortic atherosclerosis noted on ultrasound.   Spinal stenosis of lumbar region    Thoracic ascending aortic aneurysm (HCC)    4.2 cm ascending TAA 09/2016 CT, 1 yr f/u rec   Ulcer     PAST SURGICAL HISTORY: Past Surgical History:  Procedure Laterality Date   BUNIONECTOMY WITH WEIL OSTEOTOMY  Right 11/02/2019   Procedure: Right Foot Lapidus, Modified McBride Bunionectomy,  Hallux Akin Osteotomy;  Surgeon: Toni Arthurs, MD;  Location: [facility];  Service: Orthopedics;  Laterality: Right;   CARDIAC CATHETERIZATION  2005   30% Cx. Dr. Elsie Lincoln   cataract surgery Left 11/05/2014   COLONOSCOPY     COLONOSCOPY N/A 01/09/2021   Procedure: COLONOSCOPY;  Surgeon: Kerin Salen, MD;  Location: [facility];  Service: Gastroenterology;  Laterality: N/A;   ESOPHAGOGASTRODUODENOSCOPY N/A 05/02/2015   Procedure: ESOPHAGOGASTRODUODENOSCOPY (EGD);  Surgeon: Bernette Redbird, MD;  Location: [facility];  Service: Endoscopy;  Laterality: N/A;   ESOPHAGOGASTRODUODENOSCOPY  05/02/2015   no source of pt chest pain endoscopically evident. small hiatal hernia.   ESOPHAGOGASTRODUODENOSCOPY (EGD) WITH PROPOFOL N/A 01/02/2021   Procedure: ESOPHAGOGASTRODUODENOSCOPY (EGD) WITH PROPOFOL;  Surgeon: Vida Rigger, MD;  Location: [facility];  Service: Endoscopy;  Laterality: N/A;   EYE SURGERY     HARDWARE REMOVAL Right 11/02/2019   Procedure: Second Metatarsal Removal of Deep Implant and Rotational Osteotomy;  Surgeon: Toni Arthurs, MD;  Location: [facility];  Service: Orthopedics;  Laterality: Right;   IR ANGIOGRAM SELECTIVE EACH ADDITIONAL VESSEL  01/04/2021   IR ANGIOGRAM SELECTIVE EACH ADDITIONAL  VESSEL  01/04/2021   IR ANGIOGRAM VISCERAL SELECTIVE  01/04/2021   IR US GUIDE VASC ACCESS RIGHT  01/04/2021   LUMBAR LAMINECTOMY/DECOMPRESSION MICRODISCECTOMY N/A 07/03/2021   Procedure: Lumbar three through five decompression with lumbar three through five insitu fusion;  Surgeon: Venita Lick, MD;  Location: [facility] OR;  Service: Orthopedics;  Laterality: N/A;   MEMBRANE PEEL Left 03/14/2014   Procedure: MEMBRANE PEEL; ENDOLASER;  Surgeon: Edmon Crape, MD;  Location: [facility] OR;  Service: Ophthalmology;  Laterality: Left;   NM MYOVIEW LTD  10/2015   LOW RISK. Small, fixed basal lateral defect - likely diaphragmatic attenuation. 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  No R WMA.  GRII DD-moderately dilated LA..  Mildly dilated RV but normal function.  Normal RAP/CVP.  Trivial AI with mild to moderate sclerosis-no stenosis   UMBILICAL HERNIA REPAIR  2023   UPPER GASTROINTESTINAL ENDOSCOPY     WEIL OSTEOTOMY Right 09/01/2017   Procedure: RIGHT GREAT TOE CHEVRON AND WEIL OSTEOTOMY 2ND METATARSAL;  Surgeon: Nadara Mustard, MD;  Location: [facility];  Service: Orthopedics;  Laterality: Right;   XI ROBOTIC ASSISTED VENTRAL HERNIA N/A 03/17/2022   Procedure: XI ROBOTIC ASSISTED VENTRAL HERNIA;  Surgeon: Henrene Dodge, MD;  Location: [facility];  Service: General;  Laterality: N/A;    FAMILY HISTORY: Family History  Problem Relation Age of Onset   Heart disease Mother    Cancer Sister    Cancer Paternal Grandfather     SOCIAL HISTORY: Social History   Socioeconomic History   Marital status: Married    Spouse name: barbra gen   Number of children: 3   Years of education: AS   Highest education level: Not on file  Occupational History    Occupation: self employed    Employer: [employer]    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  Tobacco Use   Smoking status: Former    Types: Cigars    Quit date: 07/27/1988  Years since quitting: 34.7   Smokeless tobacco: Never  Vaping Use   Vaping status: Never Used  Substance and Sexual Activity   Alcohol use: Yes    Alcohol/week: 3.0 standard drinks of alcohol    Types: 1 Glasses of wine, 1 Cans of beer, 1 Shots of liquor per week    Comment: occasional   Drug use: No   Sexual activity: Yes  Other Topics Concern   Not on file  Social History Narrative   Lives with wife    Right handed   Drinks 1-2 cups caffeine daily   Social Determinants of Health   Financial Resource Strain: Low Risk  (10/13/2022)   Overall Financial Resource Strain (CARDIA)    Difficulty of Paying Living Expenses: Not hard at all  Food Insecurity: No Food Insecurity (10/13/2022)   Hunger Vital Sign    Worried About Running Out of Food in the Last Year: Never true    Ran Out of Food in the Last Year: Never true  Transportation Needs: No Transportation Needs (10/13/2022)   PRAPARE - Administrator, Civil Service (Medical): No    Lack of Transportation (Non-Medical): No  Physical Activity: Insufficiently Active (10/13/2022)   Exercise Vital Sign    Days of Exercise per Week: 2 days    Minutes of Exercise per Session: 60 min  Stress: No Stress Concern Present (10/13/2022)   [employer] of Occupational Health - Occupational Stress Questionnaire    Feeling of Stress : Only a little  Social Connections: Not on file  Intimate Partner Violence: Not on file     PHYSICAL EXAM  Vitals:   04/05/23 1524  BP: (!) 134/92  Pulse: 64  Weight: 151 lb (68.5 kg)  Height: 5\' 10"  (1.778 m)   Body mass index is 21.67 kg/m.  Generalized: Very pleasant elderly African-American male in no acute distress   Head: normocephalic and atraumatic  Musculoskeletal: No deformity  Neurological examination   Mentation: Awake and alert.  Fluent speech and language.    04/05/2023    4:14 PM 10/12/2022   10:59 AM 10/06/2021    2:34 PM  MMSE - Mini Mental State Exam  Orientation to time 5 4 5   Orientation to Place 5 5 5   Registration 3 3 3   Attention/ Calculation 5 1 5   Recall 3 2 2   Language- name 2 objects 2 1 2   Language- repeat 1 1 1   Language- follow 3 step command 3 3 1   Language- read & follow direction 1 1 1   Write a sentence 1 1 1   Copy design 1 1 1   Total score 30 23 27    Cranial nerve II-XII: Pupils were equal round reactive to light extraocular movements were full, visual field were full on confrontational test. Facial sensation and strength were normal.  Hearing aids bilaterally.  Uvula tongue midline. head turning and shoulder shrug were normal and symmetric.Tongue protrusion into cheek strength was normal. Motor: normal bulk and tone, full strength in the BUE, BLE, .  Diminished amplitude of finger tapping on the left, intermittent left hand and leg resting tremor, mild cogwheel rigidity upon activation at the left wrist, very slight at right. Tremor improves with action and intention.  No bradykinesia bilaterally. Unable to appreciate tremor in RUE  Sensory: normal and symmetric to light touch, on the face arms and legs  Coordination: finger-nose-finger, heel-to-shin bilaterally, no dysmetria Reflexes: 1+ upper lower and symmetric plantar responses were flexor bilaterally. Gait and Station:  Rising up from seated position without assistance, no festination or stooped posture.  Ambulated well with moderate stride and mild unsteadiness, decreased arm swing on the left., smooth turning, no assistive device.     DIAGNOSTIC DATA (LABS, IMAGING, TESTING) - I reviewed patient records, labs, notes, testing and imaging myself where available.  Lab Results  Component Value Date   WBC 5.8 01/19/2023   HGB 12.9 (L) 01/19/2023   HCT 39.6 01/19/2023   MCV 90 01/19/2023   PLT 235  01/19/2023      Component Value Date/Time   NA 136 01/19/2023 0919   K 4.5 01/19/2023 0919   CL 102 01/19/2023 0919   CO2 23 01/19/2023 0919   GLUCOSE 88 01/19/2023 0919   GLUCOSE 92 06/30/2021 1430   BUN 27 01/19/2023 0919   CREATININE 1.26 01/19/2023 0919   CREATININE 1.06 06/29/2014 0920   CALCIUM 9.7 01/19/2023 0919   PROT 7.2 01/19/2023 0919   ALBUMIN 4.5 01/19/2023 0919   AST 18 01/19/2023 0919   ALT 7 01/19/2023 0919   ALKPHOS 70 01/19/2023 0919   BILITOT 0.3 01/19/2023 0919   GFRNONAA >60 06/30/2021 1430   GFRAA 68 09/10/2020 1058   Lab Results  Component Value Date   CHOL 136 01/19/2023   HDL 55 01/19/2023   LDLCALC 72 01/19/2023   TRIG 37 01/19/2023   CHOLHDL 2.5 01/19/2023   Lab Results  Component Value Date   TSH 3.131 01/11/2021     ASSESSMENT AND PLAN 72 y.o. year old male  has a medical history of resting left arm and leg tremors with mild features of early left sided Parkinson's disease initially present in 2014 with associated tremor, bradykinesia, gait impairment/imbalance, REM sleep behavior and cognitive impairment   1.  Parkinson's disease -Overall relatively stable -Recommend continuation of Sinemet 1.5 tab TID and Artane 0.5 tab BID -continue to stay active, provided information regarding [facility] program, advised to call if wishes to participate in formal PT -recommend trying melatonin to help with REM sleep disorder -MMSE today 30/30 (prior 23/30) - continue Exelon 4.5 mg BID   2.  Possible sleep disorder -c/o daytime fatigue, insomnia, nocturia and snoring  -Referral placed to [facility] sleep clinic to rule out underlying sleep disorder -discussed importance of routine sleep schedule and good sleep hygiene      Follow-up in 6 months or call earlier if needed     CC:  Ronnald Nian, MD   I spent 40 minutes of face-to-face and non-face-to-face time with patient and wife.  This included previsit chart review, lab review, study  review, order entry, electronic health record documentation, patient education and discussion regarding diagnoses and treatment plan and answered all the questions to patient and wife satisfaction  Ihor Austin, AGNP-BC  [facility] [address] [place], [place] [address]  Phone [phone] Fax [phone] Note: This document was prepared with digital dictation and possible smart phrase technology. Any transcriptional errors that result from this process are unintentional.

## 2023-04-06 ENCOUNTER — Other Ambulatory Visit: Payer: Self-pay

## 2023-04-06 ENCOUNTER — Other Ambulatory Visit (HOSPITAL_COMMUNITY): Payer: Self-pay

## 2023-04-06 DIAGNOSIS — E785 Hyperlipidemia, unspecified: Secondary | ICD-10-CM

## 2023-04-06 MED ORDER — PITAVASTATIN CALCIUM 2 MG PO TABS
2.0000 mg | ORAL_TABLET | ORAL | 1 refills | Status: DC
Start: 2023-04-06 — End: 2023-05-10
  Filled 2023-04-06: qty 45, 90d supply, fill #0
  Filled 2023-04-06 – 2023-04-07 (×2): qty 15, 30d supply, fill #0
  Filled 2023-04-27 – 2023-05-05 (×3): qty 15, 30d supply, fill #1

## 2023-04-07 ENCOUNTER — Other Ambulatory Visit: Payer: Self-pay

## 2023-04-07 ENCOUNTER — Other Ambulatory Visit (HOSPITAL_COMMUNITY): Payer: Self-pay

## 2023-04-14 DIAGNOSIS — H18231 Secondary corneal edema, right eye: Secondary | ICD-10-CM | POA: Diagnosis not present

## 2023-04-14 DIAGNOSIS — H04123 Dry eye syndrome of bilateral lacrimal glands: Secondary | ICD-10-CM | POA: Diagnosis not present

## 2023-04-14 DIAGNOSIS — H4089 Other specified glaucoma: Secondary | ICD-10-CM | POA: Diagnosis not present

## 2023-04-14 DIAGNOSIS — H401123 Primary open-angle glaucoma, left eye, severe stage: Secondary | ICD-10-CM | POA: Diagnosis not present

## 2023-04-14 DIAGNOSIS — H34832 Tributary (branch) retinal vein occlusion, left eye, with macular edema: Secondary | ICD-10-CM | POA: Diagnosis not present

## 2023-04-15 ENCOUNTER — Encounter: Payer: Self-pay | Admitting: Family Medicine

## 2023-04-21 DIAGNOSIS — H34832 Tributary (branch) retinal vein occlusion, left eye, with macular edema: Secondary | ICD-10-CM | POA: Diagnosis not present

## 2023-04-27 ENCOUNTER — Other Ambulatory Visit (HOSPITAL_COMMUNITY): Payer: Self-pay

## 2023-04-28 ENCOUNTER — Encounter: Payer: Self-pay | Admitting: Cardiology

## 2023-04-29 ENCOUNTER — Other Ambulatory Visit (HOSPITAL_COMMUNITY): Payer: Self-pay

## 2023-04-29 MED ORDER — INFLUENZA VAC A&B SURF ANT ADJ 0.5 ML IM SUSY
PREFILLED_SYRINGE | INTRAMUSCULAR | 0 refills | Status: DC
  Filled 2023-04-29: qty 0.5, 1d supply, fill #0

## 2023-04-29 MED ORDER — COVID-19 MRNA VAC-TRIS(PFIZER) 30 MCG/0.3ML IM SUSY
0.3000 mL | PREFILLED_SYRINGE | Freq: Once | INTRAMUSCULAR | 0 refills | Status: AC
  Filled 2023-04-29: qty 0.3, 1d supply, fill #0

## 2023-04-30 NOTE — Telephone Encounter (Signed)
What medications?  Will ask Pharm team to take a look.  DH

## 2023-05-03 ENCOUNTER — Other Ambulatory Visit (HOSPITAL_COMMUNITY): Payer: Self-pay

## 2023-05-05 ENCOUNTER — Other Ambulatory Visit (HOSPITAL_COMMUNITY): Payer: Self-pay

## 2023-05-05 ENCOUNTER — Other Ambulatory Visit: Payer: Self-pay | Admitting: Neurology

## 2023-05-05 ENCOUNTER — Other Ambulatory Visit: Payer: Self-pay

## 2023-05-06 ENCOUNTER — Other Ambulatory Visit: Payer: Self-pay

## 2023-05-06 ENCOUNTER — Encounter: Payer: Self-pay | Admitting: Family Medicine

## 2023-05-06 ENCOUNTER — Other Ambulatory Visit (HOSPITAL_COMMUNITY): Payer: Self-pay

## 2023-05-06 MED ORDER — CARBIDOPA-LEVODOPA 25-100 MG PO TABS
1.5000 | ORAL_TABLET | ORAL | 3 refills | Status: DC
Start: 2023-05-06 — End: 2023-10-14
  Filled 2023-05-06: qty 135, 30d supply, fill #0
  Filled 2023-06-03 – 2023-06-04 (×2): qty 135, 30d supply, fill #1
  Filled 2023-06-28: qty 135, 30d supply, fill #2
  Filled 2023-07-29: qty 135, 30d supply, fill #3
  Filled 2023-08-25: qty 68, 15d supply, fill #4
  Filled 2023-08-26: qty 67, 15d supply, fill #4
  Filled 2023-09-06 – 2023-09-08 (×2): qty 135, 30d supply, fill #5
  Filled 2023-09-17: qty 33, 7d supply, fill #5
  Filled 2023-09-17: qty 102, 23d supply, fill #5
  Filled 2023-09-22: qty 100, 23d supply, fill #5
  Filled 2023-09-22: qty 33, 7d supply, fill #5

## 2023-05-07 ENCOUNTER — Other Ambulatory Visit (HOSPITAL_COMMUNITY): Payer: Self-pay

## 2023-05-10 ENCOUNTER — Other Ambulatory Visit (HOSPITAL_COMMUNITY): Payer: Self-pay

## 2023-05-10 ENCOUNTER — Encounter: Payer: Self-pay | Admitting: Cardiology

## 2023-05-10 MED ORDER — ATORVASTATIN CALCIUM 20 MG PO TABS
20.0000 mg | ORAL_TABLET | Freq: Every day | ORAL | 3 refills | Status: DC
Start: 2023-05-10 — End: 2024-06-01
  Filled 2023-05-10 – 2023-05-21 (×2): qty 90, 90d supply, fill #0
  Filled 2023-05-24: qty 14, 14d supply, fill #0
  Filled 2023-06-03: qty 14, 14d supply, fill #1
  Filled 2023-06-04: qty 30, 30d supply, fill #1
  Filled 2023-06-28: qty 30, 30d supply, fill #2
  Filled 2023-07-14 – 2023-07-22 (×2): qty 30, 30d supply, fill #3
  Filled 2023-08-16 – 2023-08-19 (×3): qty 30, 30d supply, fill #4
  Filled 2023-09-06 – 2023-09-21 (×4): qty 30, 30d supply, fill #5
  Filled 2023-09-30 – 2023-10-18 (×4): qty 30, 30d supply, fill #6
  Filled 2023-11-01 – 2023-11-11 (×2): qty 30, 30d supply, fill #7
  Filled 2023-11-29 – 2023-12-04 (×2): qty 30, 30d supply, fill #8
  Filled 2023-12-27 – 2024-01-04 (×3): qty 30, 30d supply, fill #9
  Filled 2024-01-24 – 2024-03-14 (×5): qty 30, 30d supply, fill #10
  Filled 2024-04-12: qty 30, 30d supply, fill #11
  Filled 2024-05-09: qty 16, 16d supply, fill #12
  Filled ????-??-??: fill #6
  Filled ????-??-??: fill #12

## 2023-05-11 ENCOUNTER — Other Ambulatory Visit: Payer: Self-pay

## 2023-05-11 ENCOUNTER — Other Ambulatory Visit (HOSPITAL_COMMUNITY): Payer: Self-pay

## 2023-05-12 NOTE — Telephone Encounter (Signed)
Since I related to what his blood pressures have been over the last year, it is me to comment on this time.  My previous clinic notes indicating allowing for permissive hypertension with systolic pressures as high as 578I.  I would be fine if we stopped his verapamil and see if the continued irbesartan.  I do not see that he has had any recorded hypotension episodes which makes it hard me to believe that this is affecting his eyes, but I am fine with stopping verapamil.  I do think this is something that can be managed by PCP.  Bryan Lemma, MD

## 2023-05-17 ENCOUNTER — Encounter: Payer: Self-pay | Admitting: Neurology

## 2023-05-17 ENCOUNTER — Ambulatory Visit: Payer: Medicare Other | Admitting: Neurology

## 2023-05-17 VITALS — BP 146/82 | HR 60 | Ht 70.0 in | Wt 151.2 lb

## 2023-05-17 DIAGNOSIS — R5383 Other fatigue: Secondary | ICD-10-CM | POA: Diagnosis not present

## 2023-05-17 DIAGNOSIS — Z9189 Other specified personal risk factors, not elsewhere classified: Secondary | ICD-10-CM

## 2023-05-17 DIAGNOSIS — H401123 Primary open-angle glaucoma, left eye, severe stage: Secondary | ICD-10-CM | POA: Diagnosis not present

## 2023-05-17 DIAGNOSIS — G4752 REM sleep behavior disorder: Secondary | ICD-10-CM | POA: Diagnosis not present

## 2023-05-17 DIAGNOSIS — R0683 Snoring: Secondary | ICD-10-CM | POA: Diagnosis not present

## 2023-05-17 DIAGNOSIS — H34832 Tributary (branch) retinal vein occlusion, left eye, with macular edema: Secondary | ICD-10-CM | POA: Diagnosis not present

## 2023-05-17 DIAGNOSIS — H538 Other visual disturbances: Secondary | ICD-10-CM | POA: Diagnosis not present

## 2023-05-17 DIAGNOSIS — H4720 Unspecified optic atrophy: Secondary | ICD-10-CM | POA: Diagnosis not present

## 2023-05-17 DIAGNOSIS — G20C Parkinsonism, unspecified: Secondary | ICD-10-CM | POA: Diagnosis not present

## 2023-05-17 DIAGNOSIS — H4089 Other specified glaucoma: Secondary | ICD-10-CM | POA: Diagnosis not present

## 2023-05-17 DIAGNOSIS — R351 Nocturia: Secondary | ICD-10-CM

## 2023-05-17 DIAGNOSIS — H02401 Unspecified ptosis of right eyelid: Secondary | ICD-10-CM | POA: Diagnosis not present

## 2023-05-17 DIAGNOSIS — H04123 Dry eye syndrome of bilateral lacrimal glands: Secondary | ICD-10-CM | POA: Diagnosis not present

## 2023-05-17 DIAGNOSIS — H18231 Secondary corneal edema, right eye: Secondary | ICD-10-CM | POA: Diagnosis not present

## 2023-05-17 NOTE — Progress Notes (Signed)
Subjective:    Patient ID: [name] is a 72 y.o. male.  HPI    Huston Foley, MD, PhD [facility] [address], [address] [address] [place], [place] [address]  Dear Shanda Bumps and Janalyn Shy,  I saw your patient, [name], upon your kind request in my sleep clinic today for initial consultation of his sleep disorder, in particular, concern for underlying obstructive sleep apnea.  The patient is accompanied by his wife today.  As you know, [name] is a 72 year old male with an underlying complex medical history of tremor, parkinsonism, allergies, arthritis, reflux disease, glaucoma, gout, hepatitis C, hyperlipidemia, hypertension, plantar fasciitis, peripheral vascular disease, and BPH, who reports snoring and excessive daytime somnolence as well as frequent night time urination as well as sleep disruption.  He has typically no trouble falling asleep but has trouble staying asleep.  He has daytime tiredness.  His Epworth sleepiness score is 6 out of 24, fatigue severity score 24 out of 63.  I reviewed your office note from 04/05/2023.  He was advised to start melatonin for symptoms of REM behavior disorder.  His wife reports that he has a tendency to act out in his dreams.  He endorses vivid dreams.  Dream enactment behavior may have been infrequently, they are unclear of the frequency, wife does not sleep in the same bedroom any longer with him.  She reports that she can hear him talking to sleep.  Bedtime is generally between 11 and 11:30 PM and he typically watches TV in bed and falls asleep with the TV on.  Rise time is around 9.  He goes to the bathroom at night about 3-4 times per average night and also has to let the dog out around 6 AM.  Around 7 AM he will go back to bed after taking his morning meds including levodopa.  He is not aware of any family history of sleep apnea.  He did try melatonin once or twice, unclear results, unclear why he stopped it.  He drinks  caffeine in the form of coffee, usually 1 or 1-1/2 cups in the mornings and 1 can of soda per day.  He does not currently drink any alcohol, he quit smoking 40+ years ago.  His wife is a retired Engineer, civil (consulting) of over 40 years.  She is familiar with CPAP therapy.  Patient had a tonsillectomy as a child.  He denies nocturnal or morning headaches.  His Past Medical History Is Significant For: Past Medical History:  Diagnosis Date   Allergy    seasonal   Arthritis    BPH (benign prostatic hyperplasia)    Complication of anesthesia    Pt. stated he had a reaction that ended in him requiring urinary cath placement; hx delirium worsening parkinson symptoms   Diverticulosis    GERD (gastroesophageal reflux disease)    esophageal spasms   Glaucoma    Gout    Head injury, closed, with concussion    Hepatitis C    chronic - Has been treated with Harvoni   HLD (hyperlipidemia)    statin intolerant (Crestor & Simvastatin) - Taking Livalo 1mg  / week   Hypertension    Parkinson's disease (HCC)    Plantar fasciitis    right   PVD (peripheral vascular disease) (HCC)    With no claudication; only mild abdominal aortic atherosclerosis noted on ultrasound.   Spinal stenosis of lumbar region    Thoracic ascending aortic aneurysm (HCC)    4.2  cm ascending TAA 09/2016 CT, 1 yr f/u rec   Ulcer     His Past Surgical History Is Significant For: Past Surgical History:  Procedure Laterality Date   BUNIONECTOMY WITH WEIL OSTEOTOMY Right 11/02/2019   Procedure: Right Foot Lapidus, Modified McBride Bunionectomy,  Hallux Akin Osteotomy;  Surgeon: Toni Arthurs, MD;  Location: [facility];  Service: Orthopedics;  Laterality: Right;   CARDIAC CATHETERIZATION  2005   30% Cx. Dr. Elsie Lincoln   cataract surgery Left 11/05/2014   COLONOSCOPY     COLONOSCOPY N/A 01/09/2021   Procedure: COLONOSCOPY;  Surgeon: Kerin Salen, MD;  Location: [facility];  Service: Gastroenterology;  Laterality: N/A;    ESOPHAGOGASTRODUODENOSCOPY N/A 05/02/2015   Procedure: ESOPHAGOGASTRODUODENOSCOPY (EGD);  Surgeon: Bernette Redbird, MD;  Location: [facility];  Service: Endoscopy;  Laterality: N/A;   ESOPHAGOGASTRODUODENOSCOPY  05/02/2015   no source of pt chest pain endoscopically evident. small hiatal hernia.   ESOPHAGOGASTRODUODENOSCOPY (EGD) WITH PROPOFOL N/A 01/02/2021   Procedure: ESOPHAGOGASTRODUODENOSCOPY (EGD) WITH PROPOFOL;  Surgeon: Vida Rigger, MD;  Location: [facility];  Service: Endoscopy;  Laterality: N/A;   EYE SURGERY     HARDWARE REMOVAL Right 11/02/2019   Procedure: Second Metatarsal Removal of Deep Implant and Rotational Osteotomy;  Surgeon: Toni Arthurs, MD;  Location: [facility];  Service: Orthopedics;  Laterality: Right;   IR ANGIOGRAM SELECTIVE EACH ADDITIONAL VESSEL  01/04/2021   IR ANGIOGRAM SELECTIVE EACH ADDITIONAL VESSEL  01/04/2021   IR ANGIOGRAM VISCERAL SELECTIVE  01/04/2021   IR US GUIDE VASC ACCESS RIGHT  01/04/2021   LUMBAR LAMINECTOMY/DECOMPRESSION MICRODISCECTOMY N/A 07/03/2021   Procedure: Lumbar three through five decompression with lumbar three through five insitu fusion;  Surgeon: Venita Lick, MD;  Location: [facility] OR;  Service: Orthopedics;  Laterality: N/A;   MEMBRANE PEEL Left 03/14/2014   Procedure: MEMBRANE PEEL; ENDOLASER;  Surgeon: Edmon Crape, MD;  Location: [facility] OR;  Service: Ophthalmology;  Laterality: Left;   NM MYOVIEW LTD  10/2015   LOW RISK. Small, fixed basal lateral defect - likely diaphragmatic attenuation. 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  No R WMA.  GRII DD-moderately dilated LA..  Mildly dilated RV but normal function.  Normal RAP/CVP.  Trivial AI with mild to  moderate sclerosis-no stenosis   UMBILICAL HERNIA REPAIR  2023   UPPER GASTROINTESTINAL ENDOSCOPY     WEIL OSTEOTOMY Right 09/01/2017   Procedure: RIGHT GREAT TOE CHEVRON AND WEIL OSTEOTOMY 2ND METATARSAL;  Surgeon: Nadara Mustard, MD;  Location: [facility] OR;  Service: Orthopedics;  Laterality: Right;   XI ROBOTIC ASSISTED VENTRAL HERNIA N/A 03/17/2022   Procedure: XI ROBOTIC ASSISTED VENTRAL HERNIA;  Surgeon: Henrene Dodge, MD;  Location: [facility];  Service: General;  Laterality: N/A;    His Family History Is Significant For: Family History  Problem Relation Age of Onset   Heart disease Mother    Cancer Sister    Cancer Paternal Grandfather    Sleep apnea Neg Hx     His Social History Is Significant For: Social History   Socioeconomic History   Marital status: Married    Spouse name: barbra gen   Number of children: 3   Years of  education: AS   Highest education level: Not on file  Occupational History   Occupation: self employed    Employer: [employer]    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  Tobacco Use   Smoking status: Former    Types: Cigars    Quit date: 07/27/1988    Years since quitting: 34.8   Smokeless tobacco: Never  Vaping Use   Vaping status: Never Used  Substance and Sexual Activity   Alcohol use: Yes    Alcohol/week: 3.0 standard drinks of alcohol    Types: 1 Glasses of wine, 1 Cans of beer, 1 Shots of liquor per week    Comment: occasional   Drug use: No   Sexual activity: Yes  Other Topics Concern   Not on file  Social History Narrative   Lives with wife    Right handed   Drinks 1-2 cups caffeine daily   Social Determinants of Health   Financial Resource Strain: Low Risk  (10/13/2022)   Overall Financial Resource Strain (CARDIA)    Difficulty of Paying Living Expenses: Not hard at all  Food Insecurity: No Food Insecurity (10/13/2022)   Hunger Vital Sign    Worried About Running Out of Food in the Last Year: Never true    Ran Out of Food in the Last Year:  Never true  Transportation Needs: No Transportation Needs (10/13/2022)   PRAPARE - Administrator, Civil Service (Medical): No    Lack of Transportation (Non-Medical): No  Physical Activity: Insufficiently Active (10/13/2022)   Exercise Vital Sign    Days of Exercise per Week: 2 days    Minutes of Exercise per Session: 60 min  Stress: No Stress Concern Present (10/13/2022)   [employer] of Occupational Health - Occupational Stress Questionnaire    Feeling of Stress : Only a little  Social Connections: Not on file    His Allergies Are:  Allergies  Allergen Reactions   Anesthesia S-I-40 [Propofol] Other (See Comments)   Aspirin Other (See Comments)   Other Other (See Comments)    Severe delirium with anesthesia per spouse enhances parkinsons symptoms  :   His Current Medications Are:  Outpatient Encounter Medications as of 05/17/2023  Medication Sig   acetaminophen (TYLENOL) 500 MG tablet Take 2 tablets (1,000 mg total) by mouth every 6 (six) hours as needed for mild pain.   alfuzosin (UROXATRAL) 10 MG 24 hr tablet Take 1 tablet (10 mg total) by mouth at bedtime.   atorvastatin (LIPITOR) 20 MG tablet Take 1 tablet (20 mg total) by mouth daily.   carbidopa-levodopa (SINEMET IR) 25-100 MG tablet Take 1&1/2 tablets by mouth before breakfast,  at noon and every evening as directed   citalopram (CELEXA) 20 MG tablet Take 1 tablet (20 mg total) by mouth at bedtime.   dorzolamide-timolol (COSOPT) 2-0.5 % ophthalmic solution Place 1 drop into both eyes 2 (two) times daily.   dorzolamide-timolol (COSOPT) 22.3-6.8 MG/ML ophthalmic solution Instill 1 drop into both eyes twice a day   FIBER ADULT GUMMIES PO Take 1 tablet by mouth 2 (two) times daily.   ibuprofen (ADVIL) 600 MG tablet Take 1 tablet (600 mg total) by mouth every 8 (eight) hours as needed for moderate pain.   ibuprofen (ADVIL) 600 MG tablet Take 1 tablet (600 mg total) by mouth every 8 (eight) hours as needed for  moderate pain.   influenza vaccine adjuvanted (FLUAD) 0.5 ML injection Inject into the muscle.   irbesartan (AVAPRO) 75 MG tablet Take  1 tablet (75 mg total) by mouth every morning.   Multiple Vitamins-Minerals (MULTIVITAMIN ADULTS 50+ PO) Take 1 tablet by mouth daily.   Netarsudil Dimesylate (RHOPRESSA) 0.02 % SOLN Place 1 drop into the left eye every evening.   nitroGLYCERIN (NITROSTAT) 0.4 MG SL tablet Place 1 tablet (0.4 mg total) under the tongue every 5 (five) minutes as needed for chest pain.   pantoprazole (PROTONIX) 40 MG tablet Take 1 tablet (40 mg total) by mouth 2 (two) times daily.   rivastigmine (EXELON) 4.5 MG capsule Take 1 capsule (4.5 mg total) by mouth 2 (two) times daily.   trihexyphenidyl (ARTANE) 2 MG tablet TAKE 1/2 TABLET BY MOUTH TWICE DAILY WITH A MEAL   verapamil (CALAN-SR) 120 MG CR tablet Take 1 tablet (120 mg total) by mouth every morning.   No facility-administered encounter medications on file as of 05/17/2023.  :   Review of Systems:  Out of a complete 14 point review of systems, all are reviewed and negative with the exception of these symptoms as listed below:  Review of Systems  Neurological:        Pt here for slee[p consult Pt states talks and moves arms and legs in sleep,fatigue Pt denies sleep study,cpap machine,hypertension, headaches    ESS:6  FSS:24    Objective:  Neurological Exam  Physical Exam Physical Examination:   Vitals:   05/17/23 1114  BP: (!) 146/82  Pulse: 60    General Examination: The patient is a very pleasant 72 y.o. male in no acute distress. He appears well-developed and well-nourished and well groomed.   HEENT: Normocephalic, atraumatic, pupils are equal, round and reactive to light, extraocular tracking is good without limitation to gaze excursion or nystagmus noted. Hearing is grossly intact with bilateral hearing aids in place. Face is symmetric with mild to moderate facial masking.  Mild to moderate nuchal  rigidity with mildly limited active range of motion in the neck, no carotid bruits, speech is clear without dysarthria, mild hypophonia.  Airway examination reveals Mallampati class III, mild residual tonsillar tissue on the right.  Redundant soft palate, moderate airway crowding, partial dentures top and bottom.  Chest: Clear to auscultation without wheezing, rhonchi or crackles noted.  Heart: S1+S2+0, regular and normal without murmurs, rubs or gallops noted.   Abdomen: Soft, non-tender and non-distended.  Extremities: There is no pitting edema in the distal lower extremities bilaterally.   Skin: Warm and dry without trophic changes noted.   Musculoskeletal: exam reveals no obvious joint deformities.   Neurologically:  Mental status: The patient is awake, alert and oriented in all 4 spheres. His immediate and remote memory, attention, language skills and fund of knowledge are fair.  Wife supplements his history.   Cranial nerves II - XII are as described above under HEENT exam.  Motor exam: Normal bulk, moving all 4 extremities without limitation, increase in tone in the left more than right upper extremities, fairly consistent mild to moderate resting tremor in the left upper extremity, mild resting tremor in the left lower extremity, intermittent tremor in the right upper extremity noted.    Fine motor skills and coordination: Globally mildly impaired.    Cerebellar testing: No dysmetria or intention tremor. There is no truncal or gait ataxia.  Sensory exam: intact to light touch in the upper and lower extremities.  Gait, station and balance: He stands with no significant difficulty, he does not require assistance, he walks without a walking aid, posture mildly stooped for age,  walks slowly, decreased arm swing left more than right.   Assessment and Plan:  In summary, KILIAN HEWITSON is a very pleasant 71 year old male with an underlying complex medical history of tremor, parkinsonism,  allergies, arthritis, reflux disease, glaucoma, gout, hepatitis C, hyperlipidemia, hypertension, plantar fasciitis, peripheral vascular disease, and BPH, whose history and physical exam are concerning for sleep disordered breathing, particularly obstructive sleep apnea.  His history is also suggestive of REM behavior disorder.  A laboratory attended sleep study is typically considered "gold standard" for evaluation of sleep disordered breathing.   I had a long chat with the patient and his wife about my findings and the diagnosis of sleep apnea, particularly OSA, its prognosis and treatment options. We talked about medical/conservative treatments, surgical interventions and non-pharmacological approaches for symptom control. I explained, in particular, the risks and ramifications of untreated moderate to severe OSA, especially with respect to developing cardiovascular disease down the road, including congestive heart failure (CHF), difficult to treat hypertension, cardiac arrhythmias (particularly A-fib), neurovascular complications including TIA, stroke and dementia. Even type 2 diabetes has, in part, been linked to untreated OSA. Symptoms of untreated OSA may include (but may not be limited to) daytime sleepiness, nocturia (i.e. frequent nighttime urination), memory problems, mood irritability and suboptimally controlled or worsening mood disorder such as depression and/or anxiety, lack of energy, lack of motivation, physical discomfort, as well as recurrent headaches, especially morning or nocturnal headaches. We talked about the importance of maintaining a healthy lifestyle and striving for healthy weight. In addition, we talked about the importance of striving for and maintaining good sleep hygiene.  He is advised to avoid watching TV in the bedroom in the first place.  He is reminded to keep a set schedule for his bedtime and rise time and try melatonin more consistently, 5 to 10 mg before bedtime or at  bedtime. I recommended a sleep study at this time. I outlined the differences between a laboratory attended sleep study which is considered more comprehensive and accurate over the option of a home sleep test (HST); the latter may lead to underestimation of sleep disordered breathing in some instances and does not help with diagnosing upper airway resistance syndrome and is not accurate enough to diagnose primary central sleep apnea typically. I outlined possible surgical and non-surgical treatment options of OSA, including the use of a positive airway pressure (PAP) device (i.e. CPAP, AutoPAP/APAP or BiPAP in certain circumstances), a custom-made dental device (aka oral appliance, which would require a referral to a specialist dentist or orthodontist typically, and is generally speaking not considered for patients with full dentures or edentulous state), upper airway surgical options, such as traditional UPPP (which is not considered a first-line treatment) or the Inspire device (hypoglossal nerve stimulator, which would involve a referral for consultation with an ENT surgeon, after careful selection, following inclusion criteria - also not first-line treatment). I explained the PAP treatment option to the patient in detail, as this is generally considered first-line treatment.  The patient indicated that he would be willing to try PAP therapy, if the need arises. I explained the importance of being compliant with PAP treatment, not only for insurance purposes but primarily to improve patient's symptoms symptoms, and for the patient's long term health benefit, including to reduce His cardiovascular risks longer-term.    We will pick up our discussion about the next steps and treatment options after testing.  We will keep them posted as to the test results by phone call and/or  MyChart messaging where possible.  We will plan to follow-up in sleep clinic accordingly as well.  I answered all their questions today  and the patient and his wife were in agreement.   I encouraged them to call with any interim questions, concerns, problems or updates or email [place] through MyChart.  Generally speaking, sleep test authorizations may take up to 2 weeks, sometimes less, sometimes longer, the patient is encouraged to get in touch with [place] if they do not hear back from the sleep lab staff directly within the next 2 weeks.  Thank you very much for allowing me to participate in the care of this nice patient. If I can be of any further assistance to you please do not hesitate to call me at [phone].  Sincerely,   Huston Foley, MD, PhD

## 2023-05-17 NOTE — Patient Instructions (Signed)

## 2023-05-21 ENCOUNTER — Other Ambulatory Visit (HOSPITAL_COMMUNITY): Payer: Self-pay

## 2023-05-24 ENCOUNTER — Other Ambulatory Visit (HOSPITAL_COMMUNITY): Payer: Self-pay

## 2023-05-25 ENCOUNTER — Telehealth: Payer: Self-pay | Admitting: Neurology

## 2023-05-25 NOTE — Telephone Encounter (Signed)
05/24/23 LVM [place] 05/17/23 [employer] medicare no auth req EE

## 2023-05-26 ENCOUNTER — Encounter: Payer: Self-pay | Admitting: Family Medicine

## 2023-05-26 DIAGNOSIS — H538 Other visual disturbances: Secondary | ICD-10-CM | POA: Diagnosis not present

## 2023-05-26 DIAGNOSIS — H18231 Secondary corneal edema, right eye: Secondary | ICD-10-CM | POA: Diagnosis not present

## 2023-05-26 DIAGNOSIS — H04123 Dry eye syndrome of bilateral lacrimal glands: Secondary | ICD-10-CM | POA: Diagnosis not present

## 2023-05-26 DIAGNOSIS — H4089 Other specified glaucoma: Secondary | ICD-10-CM | POA: Diagnosis not present

## 2023-05-26 DIAGNOSIS — H34832 Tributary (branch) retinal vein occlusion, left eye, with macular edema: Secondary | ICD-10-CM | POA: Diagnosis not present

## 2023-05-27 ENCOUNTER — Other Ambulatory Visit (HOSPITAL_COMMUNITY): Payer: Self-pay

## 2023-05-27 DIAGNOSIS — R350 Frequency of micturition: Secondary | ICD-10-CM | POA: Diagnosis not present

## 2023-05-27 DIAGNOSIS — N3281 Overactive bladder: Secondary | ICD-10-CM | POA: Diagnosis not present

## 2023-05-31 ENCOUNTER — Encounter: Payer: Self-pay | Admitting: Family Medicine

## 2023-06-03 ENCOUNTER — Other Ambulatory Visit (HOSPITAL_COMMUNITY): Payer: Self-pay

## 2023-06-04 ENCOUNTER — Other Ambulatory Visit (HOSPITAL_COMMUNITY): Payer: Self-pay

## 2023-06-04 ENCOUNTER — Other Ambulatory Visit: Payer: Self-pay

## 2023-06-05 ENCOUNTER — Other Ambulatory Visit (HOSPITAL_COMMUNITY): Payer: Self-pay

## 2023-06-07 ENCOUNTER — Other Ambulatory Visit (HOSPITAL_COMMUNITY): Payer: Self-pay

## 2023-06-08 ENCOUNTER — Other Ambulatory Visit (HOSPITAL_COMMUNITY): Payer: Self-pay

## 2023-06-08 ENCOUNTER — Other Ambulatory Visit: Payer: Self-pay

## 2023-06-15 ENCOUNTER — Other Ambulatory Visit (HOSPITAL_COMMUNITY): Payer: Self-pay

## 2023-06-15 ENCOUNTER — Other Ambulatory Visit: Payer: Self-pay

## 2023-06-20 ENCOUNTER — Encounter: Payer: Self-pay | Admitting: Adult Health

## 2023-06-20 DIAGNOSIS — R251 Tremor, unspecified: Secondary | ICD-10-CM

## 2023-06-20 DIAGNOSIS — G3184 Mild cognitive impairment, so stated: Secondary | ICD-10-CM

## 2023-06-21 ENCOUNTER — Other Ambulatory Visit: Payer: Self-pay | Admitting: *Deleted

## 2023-06-21 ENCOUNTER — Other Ambulatory Visit (HOSPITAL_COMMUNITY): Payer: Self-pay

## 2023-06-21 MED ORDER — TRIHEXYPHENIDYL HCL 2 MG PO TABS
ORAL_TABLET | ORAL | 5 refills | Status: DC
  Filled 2023-06-21: qty 30, 30d supply, fill #0

## 2023-06-21 NOTE — Addendum Note (Signed)
Addended by: Raynald Kemp A on: 06/21/2023 03:03 PM   Modules accepted: Orders

## 2023-06-22 ENCOUNTER — Other Ambulatory Visit: Payer: Self-pay

## 2023-06-22 ENCOUNTER — Other Ambulatory Visit (HOSPITAL_COMMUNITY): Payer: Self-pay

## 2023-06-23 ENCOUNTER — Encounter: Payer: Self-pay | Admitting: Adult Health

## 2023-06-27 ENCOUNTER — Telehealth: Payer: Self-pay | Admitting: Neurology

## 2023-06-27 NOTE — Telephone Encounter (Signed)
Patient's wife and patient called stating he was just started on Artane and having hallucinations and wants to stop the medication.  Since just recently started and taking a half a pill twice a day I said it was okay to stop it and would likely not have any withdrawal symptoms if he does please go to the emergency room.  But I did state that they should see primary care, even though he started having the symptoms right after the medications he should still have a medical examination to should ensure this was in [facility] he does not have an infection, dehydration, urinary tract infection or anything else that could cause the symptoms instead of the medication.

## 2023-06-28 ENCOUNTER — Other Ambulatory Visit (HOSPITAL_COMMUNITY): Payer: Self-pay

## 2023-06-28 ENCOUNTER — Other Ambulatory Visit: Payer: Self-pay

## 2023-06-28 NOTE — Telephone Encounter (Signed)
This is Dr. Pearlean Brownie and jessica's patient please direct all mycharts to him please or wpork in thanks

## 2023-06-28 NOTE — Telephone Encounter (Signed)
Thank you for the notification, Dr. Lucia Gaskins.  I copied Dr. Pearlean Brownie and Ihor Austin, NP, primary neuro team from patient.

## 2023-06-28 NOTE — Telephone Encounter (Signed)
Wife called back and I was able to review the information with her. She is unsure when he stopped the medication but at some point with previous surgeries he didn't tolerate the anesthesia well and she isnt sure if it was stopped because of that or what but he had not been having issues with hallucinations. When he started the medication on 06/21/2023 he took it at full dose prescribed and had hallucinations. Wife stopped the medication on fri 11/29 and since then hallucinations have cleared. Advised that he may need to work back up to that full dose and Shanda Bumps recommends starting 1/2 tablet daily for 7 days and then update [place]. If he develops the hallucinations again he should let us know. She verbalized understanding and will start this back.

## 2023-06-28 NOTE — Telephone Encounter (Signed)
Noted! Thank you

## 2023-06-28 NOTE — Telephone Encounter (Signed)
Please contact patient/wife - he has been on Artane since 2021 and had been tolerating this well. More recently, wife sent MyChart message with concern of freezing spells and upon further discussion, determined patient had not been on Artane for unclear reason and was instructed to restart this. Please see how long he had been without Artane. Depending on how long he has been without medication, may need to be restarted at a lower dosage and gradually increase to prior dosage of 2mg  BID. Thank you.

## 2023-06-28 NOTE — Telephone Encounter (Signed)
I did also send her a reply to her mychart message with Jessica's recommendation.

## 2023-06-28 NOTE — Telephone Encounter (Signed)
Please see telephone note regarding Artane. Based on this conversation, I am assuming wife is unsure when Artane was stopped. Can pharmacy be contacted to see when they last picked up prescription and how many days it was filled for? As mentioned in telephone note, may need to restart Artane at lower dosage depending on how long he was off of it for. Thank you.

## 2023-06-28 NOTE — Telephone Encounter (Signed)
Called the patient's wife. There was no answer. LVM for the wife to call back.

## 2023-06-30 ENCOUNTER — Other Ambulatory Visit: Payer: Self-pay | Admitting: Adult Health

## 2023-06-30 ENCOUNTER — Other Ambulatory Visit (HOSPITAL_COMMUNITY): Payer: Self-pay

## 2023-06-30 ENCOUNTER — Other Ambulatory Visit: Payer: Self-pay

## 2023-06-30 DIAGNOSIS — R251 Tremor, unspecified: Secondary | ICD-10-CM

## 2023-06-30 DIAGNOSIS — H04123 Dry eye syndrome of bilateral lacrimal glands: Secondary | ICD-10-CM | POA: Diagnosis not present

## 2023-06-30 DIAGNOSIS — H34832 Tributary (branch) retinal vein occlusion, left eye, with macular edema: Secondary | ICD-10-CM | POA: Diagnosis not present

## 2023-06-30 DIAGNOSIS — G3184 Mild cognitive impairment, so stated: Secondary | ICD-10-CM

## 2023-06-30 DIAGNOSIS — H18231 Secondary corneal edema, right eye: Secondary | ICD-10-CM | POA: Diagnosis not present

## 2023-06-30 DIAGNOSIS — H401123 Primary open-angle glaucoma, left eye, severe stage: Secondary | ICD-10-CM | POA: Diagnosis not present

## 2023-06-30 DIAGNOSIS — H4089 Other specified glaucoma: Secondary | ICD-10-CM | POA: Diagnosis not present

## 2023-06-30 MED ORDER — TRIHEXYPHENIDYL HCL 2 MG PO TABS
ORAL_TABLET | ORAL | 5 refills | Status: DC
  Filled 2023-06-30: qty 30, fill #0
  Filled 2023-07-29: qty 30, 30d supply, fill #0
  Filled 2023-08-21 – 2023-08-23 (×2): qty 30, 30d supply, fill #1
  Filled 2023-09-06 – 2023-09-17 (×2): qty 30, 30d supply, fill #2

## 2023-07-01 ENCOUNTER — Other Ambulatory Visit (HOSPITAL_COMMUNITY): Payer: Self-pay

## 2023-07-08 ENCOUNTER — Encounter: Payer: Self-pay | Admitting: Family Medicine

## 2023-07-09 DIAGNOSIS — H43812 Vitreous degeneration, left eye: Secondary | ICD-10-CM | POA: Diagnosis not present

## 2023-07-09 DIAGNOSIS — H34832 Tributary (branch) retinal vein occlusion, left eye, with macular edema: Secondary | ICD-10-CM | POA: Diagnosis not present

## 2023-07-09 DIAGNOSIS — H31093 Other chorioretinal scars, bilateral: Secondary | ICD-10-CM | POA: Diagnosis not present

## 2023-07-09 DIAGNOSIS — H34812 Central retinal vein occlusion, left eye, with macular edema: Secondary | ICD-10-CM | POA: Diagnosis not present

## 2023-07-09 DIAGNOSIS — H4720 Unspecified optic atrophy: Secondary | ICD-10-CM | POA: Diagnosis not present

## 2023-07-09 DIAGNOSIS — H35372 Puckering of macula, left eye: Secondary | ICD-10-CM | POA: Diagnosis not present

## 2023-07-09 DIAGNOSIS — H01021 Squamous blepharitis right upper eyelid: Secondary | ICD-10-CM | POA: Diagnosis not present

## 2023-07-09 DIAGNOSIS — H18231 Secondary corneal edema, right eye: Secondary | ICD-10-CM | POA: Diagnosis not present

## 2023-07-09 DIAGNOSIS — Z961 Presence of intraocular lens: Secondary | ICD-10-CM | POA: Diagnosis not present

## 2023-07-09 DIAGNOSIS — H04123 Dry eye syndrome of bilateral lacrimal glands: Secondary | ICD-10-CM | POA: Diagnosis not present

## 2023-07-09 DIAGNOSIS — H401132 Primary open-angle glaucoma, bilateral, moderate stage: Secondary | ICD-10-CM | POA: Diagnosis not present

## 2023-07-09 DIAGNOSIS — H01024 Squamous blepharitis left upper eyelid: Secondary | ICD-10-CM | POA: Diagnosis not present

## 2023-07-13 ENCOUNTER — Ambulatory Visit (INDEPENDENT_AMBULATORY_CARE_PROVIDER_SITE_OTHER): Payer: Medicare Other | Admitting: Neurology

## 2023-07-13 DIAGNOSIS — G4720 Circadian rhythm sleep disorder, unspecified type: Secondary | ICD-10-CM

## 2023-07-13 DIAGNOSIS — G4733 Obstructive sleep apnea (adult) (pediatric): Secondary | ICD-10-CM

## 2023-07-13 DIAGNOSIS — G4752 REM sleep behavior disorder: Secondary | ICD-10-CM

## 2023-07-13 DIAGNOSIS — R5383 Other fatigue: Secondary | ICD-10-CM

## 2023-07-13 DIAGNOSIS — R0683 Snoring: Secondary | ICD-10-CM

## 2023-07-13 DIAGNOSIS — Z9189 Other specified personal risk factors, not elsewhere classified: Secondary | ICD-10-CM

## 2023-07-13 DIAGNOSIS — R351 Nocturia: Secondary | ICD-10-CM

## 2023-07-13 DIAGNOSIS — G20C Parkinsonism, unspecified: Secondary | ICD-10-CM

## 2023-07-14 ENCOUNTER — Other Ambulatory Visit: Payer: Self-pay | Admitting: Adult Health

## 2023-07-14 ENCOUNTER — Other Ambulatory Visit: Payer: Self-pay

## 2023-07-15 ENCOUNTER — Other Ambulatory Visit (HOSPITAL_COMMUNITY): Payer: Self-pay

## 2023-07-15 MED ORDER — RIVASTIGMINE TARTRATE 4.5 MG PO CAPS
4.5000 mg | ORAL_CAPSULE | Freq: Two times a day (BID) | ORAL | 5 refills | Status: DC
  Filled 2023-07-22: qty 60, 30d supply, fill #0
  Filled 2023-08-16 – 2023-08-24 (×4): qty 60, 30d supply, fill #1
  Filled 2023-09-06 – 2023-09-21 (×4): qty 60, 30d supply, fill #2
  Filled 2023-09-30 – 2023-10-14 (×3): qty 60, 30d supply, fill #3
  Filled ????-??-??: fill #3

## 2023-07-19 ENCOUNTER — Other Ambulatory Visit: Payer: Self-pay

## 2023-07-20 ENCOUNTER — Other Ambulatory Visit: Payer: Self-pay

## 2023-07-20 NOTE — Procedures (Signed)
Physician Interpretation:     [facility] POLYSOMNOGRAPHY  INTERPRETATION REPORT   STUDY DATE:  07/13/2023     PATIENT NAME:  [name]         DATE OF BIRTH:  May 01, 1951  PATIENT ID:  [id number]    TYPE OF STUDY:  PSG  READING PHYSICIAN: Huston Foley, MD, PhD   SCORING TECHNICIAN: Domingo Cocking, RPSGT  Referred by: Ihor Austin, NP ? History and Indication for Testing: 72 year old male with an underlying complex medical history of tremor, parkinsonism, allergies, arthritis, reflux disease, glaucoma, gout, hepatitis C, hyperlipidemia, hypertension, plantar fasciitis, peripheral vascular disease, and BPH, who reports snoring and excessive daytime somnolence as well as frequent night time urination as well as sleep disruption. His Epworth sleepiness score is 6 out of 24, fatigue severity score 24 out of 63.  Height: 70 in Weight: 151 lb (BMI 21) Neck Size: 0 in    MEDICATIONS: Tylenol, Uroxatral, Lipitor, Sinemet, Celexa, Cosoopt, fiber adult gummies, Advil, Avapro, Multivitamins adults, Rhopressa eye drops, Nitrostat, Protonix, Exelon, Artane, Calan-SR   TECHNICAL DESCRIPTION: A registered sleep technologist was in attendance for the duration of the recording.  Data collection, scoring, video monitoring, and reporting were performed in compliance with the AASM Manual for the Scoring of Sleep and Associated Events; (Hypopnea is scored based on the criteria listed in Section VIII D. 1b in the AASM Manual V2.6 using a 4% oxygen desaturation rule or Hypopnea is scored based on the criteria listed in Section VIII D. 1a in the AASM Manual V2.6 using 3% oxygen desaturation and /or arousal rule).   SLEEP CONTINUITY AND SLEEP ARCHITECTURE:  Lights-out was at 21:55: and lights-on at  05:02:, with a total recording time of 7 hours, 7 min. Total sleep time ( TST) was 293.0 minutes with a decreased sleep efficiency at 68.6%. There was  20.3% REM sleep.  BODY  POSITION:  TST was divided  between the following sleep positions: 33.3% supine;  66.7% lateral;  0% prone. Duration of total sleep and percent of total sleep in their respective position is as follows: supine 97 minutes (33%), non-supine 196 minutes (67%); right 00 minutes (0%), left 195 minutes (67%), and prone 00 minutes (0%).  Total supine REM sleep time was 32 minutes (55% of total REM sleep).  Sleep latency was increased at 37.5 minutes.  REM sleep latency was increased at 125.0 minutes. Of the total sleep time, the percentage of stage N1 sleep was 28.3%, which is markedly increased, stage N2 sleep was 51%, which is normal, stage N3 sleep was absent, and REM sleep was 20.3%, which is normal. Wake after sleep onset (WASO) time accounted for 96.5 minutes with moderate to severe sleep fragmentation noted.   RESPIRATORY MONITORING:   Based on CMS criteria (using a 4% oxygen desaturation rule for scoring hypopneas), there were 41 apneas (30 obstructive; 3 central; 8 mixed), and 15 hypopneas.  Apnea index was 8.4. Hypopnea index was 3.1. The apnea-hypopnea index was 11.5 overall (7.4 supine, 11 non-supine; 8.1 REM, 5.5 supine REM).  There were 0 respiratory effort-related arousals (RERAs).  The RERA index was 0 events/h. Total respiratory disturbance index (RDI) was 11.5 events/h. RDI results showed: supine RDI  7.4 /h; non-supine RDI 13.5 /h; REM RDI 8.1 /h, supine REM RDI 5.5 /h.   Based on AASM criteria (using a 3% oxygen desaturation and /or arousal rule for scoring hypopneas), there were 41 apneas (30 obstructive; 3 central; 8 mixed), and 20 hypopneas.  Apnea index was 8.4. Hypopnea index was 4.1. The apnea-hypopnea index was 12.5 overall (8.0 supine, 16 non-supine; 10.1 REM, 5.5 supine REM).  There were 0 respiratory effort-related arousals (RERAs).  The RERA index was 0 events/h. Total respiratory disturbance index (RDI) was 12.5 events/h. RDI results showed: supine RDI  8.0 /h; non-supine RDI 14.7  /h; REM RDI 10.1 /h, supine REM RDI 5.5 /h.    OXIMETRY: Oxyhemoglobin Saturation Nadir during sleep was at  79%  from a mean of 94%.  Of the Total sleep time (TST)   hypoxemia (=<88%) was present for  14.3 minutes, or 4.9% of total sleep time. This is likely an overestimation, due to O2 sensor errors with arousals and during wakefulness   LIMB MOVEMENTS: There were 0 periodic limb movements of sleep (0.0/hr), of which 0 (0.0/hr) were associated with an arousal.   AROUSAL: There were 76 arousals in total, for an arousal index of 16 arousals/hour.  Of these, 12 were identified as respiratory-related arousals (2 /h), 0 were PLM-related arousals (0 /h), and 72 were non-specific arousals (15 /h).   EEG:  Review of the EEG showed no abnormal electrical discharges and symmetrical bihemispheric findings.    EKG: The EKG revealed normal sinus rhythm (NSR). The average heart rate during sleep was 52 bpm.  AUDIO/VIDEO REVIEW: The audio and video review did not show any abnormal or unusual behaviors, movements, phonations or vocalizations. The patient took 2 restroom breaks. Snoring was noted, in the mild range.  POST-STUDY QUESTIONNAIRE: Post study, the patient indicated, that sleep was better than usual.   IMPRESSION:  1. Obstructive Sleep Apnea (OSA) 2. Dysfunctions associated with sleep stages or arousal from sleep  RECOMMENDATIONS:  1. This study demonstrates overall mild obstructive sleep apnea, with a total AHI of 11.5/h, supine AHI of 7.4/h, REM AHI of 8.1/h, O2 nadir 86%.  Given the patient's medical history and sleep related complaints, treatment with positive airway pressure is recommended; this can be achieved in the form of autoPAP. A full-night CPAP titration study would allow optimization of therapy if needed. Other treatment options may include avoidance of supine sleep position along with weight loss, or the use of an oral appliance in selected patients. Please note, that untreated  obstructive sleep apnea may carry additional perioperative morbidity. Patients with significant obstructive sleep apnea should receive perioperative PAP therapy and the surgeons and particularly the anesthesiologist should be informed of the diagnosis and the severity of the sleep disordered breathing. 2. This study shows significant sleep fragmentation and abnormal sleep stage percentages; these are nonspecific findings and per se do not signify an intrinsic sleep disorder or a cause for the patient's sleep-related symptoms. Causes include (but are not limited to) the first night effect of the sleep study, circadian rhythm disturbances, medication effect or an underlying mood disorder or medical problem.  3. The patient should be cautioned not to drive, work at heights, or operate dangerous or heavy equipment when tired or sleepy. Review and reiteration of good sleep hygiene measures should be pursued with any patient. 4. The patient will be seen in follow-up by Dr. Frances Furbish at [facility] for discussion of the test results and further management strategies. The referring provider will be notified of the test results.   I certify that I have reviewed the entire raw data recording prior to the issuance of this report in accordance with the Standards of Accreditation of the American Academy of Sleep Medicine (AASM).  Huston Foley, MD, PhD Medical Director,  [facility] ([facility]) Diplomat, ABPN (Neurology and Sleep)               Technical Report:   General Information  Name: [name], [name] BMI: 21.67 Physician: Huston Foley, MD  ID: [id number] Height: 70.0 in Technician: Domingo Cocking, RPSGT  Sex: Male Weight: 151.0 lb Record: [id number]  Age: 26 [1950-08-28] Date: 07/13/2023    Medical & Medication History    Mr. Bascom is a 72 year old male with an underlying complex medical history of tremor, parkinsonism, allergies, arthritis, reflux disease, glaucoma, gout,  hepatitis C, hyperlipidemia, hypertension, plantar fasciitis, peripheral vascular disease, and BPH, who reports snoring and excessive daytime somnolence as well as frequent night time urination as well as sleep disruption. He has typically no trouble falling asleep but has trouble staying asleep. He has daytime tiredness. His Epworth sleepiness score is 6 out of 24, fatigue severity score 24 out of 63. He was advised to start melatonin for symptoms of REM behavior disorder. His wife reports that he has a tendency to act out in his dreams. He endorses vivid dreams. Dream enactment behavior may have been infrequently, they are unclear of the frequency, wife does not sleep in the same bedroom any longer with him. She reports that she can hear him talking to sleep. Bedtime is generally between 11 and 11:30 PM and he typically watches TV in bed and falls asleep with the TV on. Rise time is around 9. He goes to the bathroom at night about 3-4 times per average night and also has to let the dog out around 6 AM. Around 7 AM he will go back to bed after taking his morning meds including levodopa. He is not aware of any family history of sleep apnea. He did try melatonin once or twice, unclear results, unclear why he stopped it. He drinks caffeine in the form of coffee, usually 1 or 1-1/2 cups in the mornings and 1 can of soda per day. He does not currently drink any alcohol, he quit smoking 40+ years ago. His wife is a retired Engineer, civil (consulting) of over 40 years. She is familiar with CPAP therapy. Patient had a tonsillectomy as a child. He denies nocturnal or morning headaches.  Tylenol, Uroxatral, Lipitor, Sinemet, Celexa, Cosoopt, fiber adult gummies, Advil, Avapro, Multivitamins adults, Rhopressa eye drops, Nitrostat, Protonix, Exelon, Artane, Calan-SR   Sleep Disorder      Comments   Patient arrived for a diagnostic polysomnogram. Procedure explained and all questions answered. Standard paste setup without complications. Highly  fragmented sleep as well as delay to persistent sleep. Patient slept supine and left. Mild snoring heard. Respiratory events observed. After 2 hours total sleep time, AHI = 19.5. No significant cardiac arrhythmias observed. No significant PLMS observed. Two restroom visits. Very limited somniloquy vs. mumbles/moans.    Lights out: 09:55:32 PM Lights on: 05:02:36 AM   Time Total Supine Side Prone Upright  Recording (TRT) 7h 7.70m 1h 56.4m 5h 8.48m 0h 0.85m 0h 2.8m  Sleep (TST) 4h 53.12m 1h 37.65m 3h 15.39m 0h 0.35m 0h 0.21m   Latency N1 N2 N3 REM Onset Per. Slp. Eff.  Actual 0h 0.50m 1h 13.52m 0h 0.50m 2h 5.74m 0h 37.48m 2h 4.77m 68.62%   Stg Dur Wake N1 N2 N3 REM  Total 134.0 83.0 150.5 0.0 59.5  Supine 19.0 24.0 41.0 0.0 32.5  Side 112.5 59.0 109.5 0.0 27.0  Prone 0.0 0.0 0.0 0.0 0.0  Upright 2.5 0.0 0.0 0.0 0.0   Stg %  Wake N1 N2 N3 REM  Total 31.4 28.3 51.4 0.0 20.3  Supine 4.4 8.2 14.0 0.0 11.1  Side 26.3 20.1 37.4 0.0 9.2  Prone 0.0 0.0 0.0 0.0 0.0  Upright 0.6 0.0 0.0 0.0 0.0     Apnea Summary Sub Supine Side Prone Upright  Total 41 Total 41 12 29 0 0    REM 4 3 1  0 0    NREM 37 9 28 0 0  Obs 30 REM 2 1 1  0 0    NREM 28 5 23  0 0  Mix 8 REM 2 2 0 0 0    NREM 6 3 3  0 0  Cen 3 REM 0 0 0 0 0    NREM 3 1 2  0 0   Rera Summary Sub Supine Side Prone Upright  Total 0 Total 0 0 0 0 0    REM 0 0 0 0 0    NREM 0 0 0 0 0   Hypopnea Summary Sub Supine Side Prone Upright  Total 20 Total 20 1 19  0 0    REM 6 0 6 0 0    NREM 14 1 13  0 0   4% Hypopnea Summary Sub Supine Side Prone Upright  Total (4%) 15 Total 15 0 15 0 0    REM 4 0 4 0 0    NREM 11 0 11 0 0     AHI Total Obs Mix Cen  12.49 Apnea 8.40 6.14 1.64 0.61   Hypopnea 4.10 -- -- --  11.47 Hypopnea (4%) 3.07 -- -- --    Total Supine Side Prone Upright  Position AHI 12.49 8.00 14.73 0.00 0.00  REM AHI 10.08   NREM AHI 13.10   Position RDI 12.49 8.00 14.73 0.00 0.00  REM RDI 10.08   NREM RDI 13.10    4% Hypopnea Total  Supine Side Prone Upright  Position AHI (4%) 11.47 7.38 13.50 0.00 0.00  REM AHI (4%) 8.07   NREM AHI (4%) 12.33   Position RDI (4%) 11.47 7.38 13.50 0.00 0.00  REM RDI (4%) 8.07   NREM RDI (4%) 12.33    Desaturation Information Threshold: 2% <100% <90% <80% <70% <60% <50% <40%  Supine 62.0 0.0 0.0 0.0 0.0 0.0 0.0  Side 225.0 43.0 2.0 0.0 0.0 0.0 0.0  Prone 0.0 0.0 0.0 0.0 0.0 0.0 0.0  Upright 2.0 0.0 0.0 0.0 0.0 0.0 0.0  Total 289.0 43.0 2.0 0.0 0.0 0.0 0.0  Index 44.5 6.6 0.3 0.0 0.0 0.0 0.0   Threshold: 3% <100% <90% <80% <70% <60% <50% <40%  Supine 31.0 0.0 0.0 0.0 0.0 0.0 0.0  Side 184.0 42.0 2.0 0.0 0.0 0.0 0.0  Prone 0.0 0.0 0.0 0.0 0.0 0.0 0.0  Upright 1.0 0.0 0.0 0.0 0.0 0.0 0.0  Total 216.0 42.0 2.0 0.0 0.0 0.0 0.0  Index 33.3 6.5 0.3 0.0 0.0 0.0 0.0   Threshold: 4% <100% <90% <80% <70% <60% <50% <40%  Supine 14.0 0.0 0.0 0.0 0.0 0.0 0.0  Side 149.0 42.0 2.0 0.0 0.0 0.0 0.0  Prone 0.0 0.0 0.0 0.0 0.0 0.0 0.0  Upright 0.0 0.0 0.0 0.0 0.0 0.0 0.0  Total 163.0 42.0 2.0 0.0 0.0 0.0 0.0  Index 25.1 6.5 0.3 0.0 0.0 0.0 0.0   Threshold: 3% <100% <90% <80% <70% <60% <50% <40%  Supine 31 0 0 0 0 0 0  Side 184 42 2 0 0 0 0  Prone 0 0 0  0 0 0 0  Upright 1 0 0 0 0 0 0  Total 216 42 2 0 0 0 0   Awakening/Arousal Information # of Awakenings 27  Wake after sleep onset 96.34m  Wake after persistent sleep 34.68m   Arousal Assoc. Arousals Index  Apneas 8 1.6  Hypopneas 4 0.8  Leg Movements 7 1.4  Snore 0 0.0  PTT Arousals 0 0.0  Spontaneous 72 14.7  Total 91 18.6  Leg Movement Information PLMS LMs Index  Total LMs during PLMS 0 0.0  LMs w/ Microarousals 0 0.0   LM LMs Index  w/ Microarousal 7 1.4  w/ Awakening 2 0.4  w/ Resp Event 0 0.0  Spontaneous 18 3.7  Total 25 5.1     Desaturation threshold setting: 3% Minimum desaturation setting: 10 seconds SaO2 nadir: 67% The longest event was a 26 sec obstructive Hypopnea with a minimum SaO2 of 91%. The lowest SaO2  was 84% associated with a 13 sec mixed Apnea. EKG Rates EKG Avg Max Min  Awake 57 117 46  Asleep 52 68 47  EKG Events: N/A

## 2023-07-20 NOTE — Addendum Note (Signed)
Addended by: Huston Foley on: 07/20/2023 04:12 PM   Modules accepted: Orders

## 2023-07-22 ENCOUNTER — Other Ambulatory Visit (HOSPITAL_COMMUNITY): Payer: Self-pay

## 2023-07-22 ENCOUNTER — Other Ambulatory Visit: Payer: Self-pay

## 2023-07-22 ENCOUNTER — Encounter: Payer: Self-pay | Admitting: Neurology

## 2023-07-23 ENCOUNTER — Other Ambulatory Visit (HOSPITAL_COMMUNITY): Payer: Self-pay

## 2023-07-23 ENCOUNTER — Other Ambulatory Visit: Payer: Self-pay

## 2023-07-24 ENCOUNTER — Other Ambulatory Visit (HOSPITAL_COMMUNITY): Payer: Self-pay

## 2023-07-26 ENCOUNTER — Other Ambulatory Visit: Payer: Self-pay

## 2023-07-26 ENCOUNTER — Other Ambulatory Visit (HOSPITAL_COMMUNITY): Payer: Self-pay

## 2023-07-29 ENCOUNTER — Other Ambulatory Visit (HOSPITAL_COMMUNITY): Payer: Self-pay

## 2023-07-29 DIAGNOSIS — N3281 Overactive bladder: Secondary | ICD-10-CM | POA: Diagnosis not present

## 2023-07-29 MED ORDER — MIRABEGRON ER 50 MG PO TB24
50.0000 mg | ORAL_TABLET | Freq: Every day | ORAL | 11 refills | Status: DC
  Filled 2023-07-29 – 2023-08-04 (×2): qty 30, 30d supply, fill #0

## 2023-07-29 MED ORDER — AREXVY 120 MCG/0.5ML IM SUSR
0.5000 mL | Freq: Once | INTRAMUSCULAR | 0 refills | Status: AC
  Filled 2023-07-29 – 2023-08-02 (×2): qty 0.5, 1d supply, fill #0

## 2023-07-29 MED ORDER — ALFUZOSIN HCL ER 10 MG PO TB24
10.0000 mg | ORAL_TABLET | Freq: Every day | ORAL | 3 refills | Status: DC
  Filled 2023-07-29 – 2023-08-23 (×10): qty 90, 90d supply, fill #0
  Filled 2023-08-24: qty 30, 30d supply, fill #0
  Filled 2023-08-24: qty 90, 90d supply, fill #0
  Filled 2023-09-06 – 2023-09-21 (×4): qty 30, 30d supply, fill #1
  Filled 2023-09-30 – 2023-10-18 (×4): qty 30, 30d supply, fill #2
  Filled 2023-11-01 – 2023-11-11 (×2): qty 30, 30d supply, fill #3
  Filled 2023-11-29 – 2023-12-04 (×2): qty 30, 30d supply, fill #4
  Filled 2023-12-27 – 2024-01-04 (×3): qty 30, 30d supply, fill #5
  Filled 2024-01-24 – 2024-03-14 (×5): qty 30, 30d supply, fill #6
  Filled 2024-04-12: qty 30, 30d supply, fill #7
  Filled 2024-04-28 – 2024-05-09 (×2): qty 30, 30d supply, fill #8
  Filled 2024-06-05: qty 30, 30d supply, fill #9
  Filled 2024-07-10: qty 30, 30d supply, fill #10
  Filled ????-??-??: fill #2

## 2023-07-30 ENCOUNTER — Other Ambulatory Visit (HOSPITAL_COMMUNITY): Payer: Self-pay

## 2023-07-30 ENCOUNTER — Other Ambulatory Visit: Payer: Self-pay

## 2023-08-02 ENCOUNTER — Other Ambulatory Visit: Payer: Self-pay

## 2023-08-02 ENCOUNTER — Other Ambulatory Visit (HOSPITAL_BASED_OUTPATIENT_CLINIC_OR_DEPARTMENT_OTHER): Payer: Self-pay

## 2023-08-02 ENCOUNTER — Other Ambulatory Visit (HOSPITAL_COMMUNITY): Payer: Self-pay

## 2023-08-02 MED ORDER — MYRBETRIQ 50 MG PO TB24
50.0000 mg | ORAL_TABLET | Freq: Every day | ORAL | 11 refills | Status: DC
  Filled 2023-08-02 – 2023-08-03 (×2): qty 30, 30d supply, fill #0

## 2023-08-02 MED ORDER — IBUPROFEN 600 MG PO TABS
600.0000 mg | ORAL_TABLET | Freq: Three times a day (TID) | ORAL | 2 refills | Status: DC | PRN
  Filled 2023-08-02: qty 90, 30d supply, fill #0
  Filled 2023-10-19: qty 90, 30d supply, fill #1
  Filled 2023-11-01 – 2023-11-11 (×2): qty 90, 30d supply, fill #2

## 2023-08-03 ENCOUNTER — Other Ambulatory Visit: Payer: Self-pay

## 2023-08-03 ENCOUNTER — Other Ambulatory Visit (HOSPITAL_BASED_OUTPATIENT_CLINIC_OR_DEPARTMENT_OTHER): Payer: Self-pay

## 2023-08-03 ENCOUNTER — Other Ambulatory Visit (HOSPITAL_COMMUNITY): Payer: Self-pay

## 2023-08-03 MED ORDER — GEMTESA 75 MG PO TABS
75.0000 mg | ORAL_TABLET | Freq: Every day | ORAL | 1 refills | Status: DC
  Filled 2023-08-03: qty 30, 30d supply, fill #0
  Filled 2023-08-16 – 2023-12-01 (×11): qty 30, 30d supply, fill #1

## 2023-08-04 ENCOUNTER — Other Ambulatory Visit (HOSPITAL_COMMUNITY): Payer: Self-pay

## 2023-08-04 ENCOUNTER — Other Ambulatory Visit: Payer: Self-pay

## 2023-08-05 ENCOUNTER — Other Ambulatory Visit (HOSPITAL_BASED_OUTPATIENT_CLINIC_OR_DEPARTMENT_OTHER): Payer: Self-pay

## 2023-08-05 ENCOUNTER — Other Ambulatory Visit (HOSPITAL_COMMUNITY): Payer: Self-pay

## 2023-08-07 ENCOUNTER — Other Ambulatory Visit (HOSPITAL_COMMUNITY): Payer: Self-pay

## 2023-08-10 ENCOUNTER — Other Ambulatory Visit (HOSPITAL_COMMUNITY): Payer: Self-pay

## 2023-08-11 DIAGNOSIS — H02401 Unspecified ptosis of right eyelid: Secondary | ICD-10-CM | POA: Diagnosis not present

## 2023-08-11 DIAGNOSIS — H538 Other visual disturbances: Secondary | ICD-10-CM | POA: Diagnosis not present

## 2023-08-11 DIAGNOSIS — H4089 Other specified glaucoma: Secondary | ICD-10-CM | POA: Diagnosis not present

## 2023-08-11 DIAGNOSIS — H401123 Primary open-angle glaucoma, left eye, severe stage: Secondary | ICD-10-CM | POA: Diagnosis not present

## 2023-08-11 DIAGNOSIS — H04123 Dry eye syndrome of bilateral lacrimal glands: Secondary | ICD-10-CM | POA: Diagnosis not present

## 2023-08-11 DIAGNOSIS — H34832 Tributary (branch) retinal vein occlusion, left eye, with macular edema: Secondary | ICD-10-CM | POA: Diagnosis not present

## 2023-08-11 DIAGNOSIS — H18231 Secondary corneal edema, right eye: Secondary | ICD-10-CM | POA: Diagnosis not present

## 2023-08-13 ENCOUNTER — Telehealth: Payer: Self-pay

## 2023-08-13 NOTE — Progress Notes (Signed)
   08/13/2023  Patient ID: [name], male   DOB: [birth year], 73 y.o.   MRN: [id number]  Received phone call from patient's wife regarding cost concerns on Myrbetriq/Gemtesa.   Both medications have very expensive insurance copay. Picked up one month supply of Gemtesa and reports it was $100.  Unfortunately, patient does not qualify for Gemtesa PAP and myrbetriq no longer has an assistance fund. Agreed to setup a phone call appt for next week to further discuss options/cost.  Sherrill Raring, PharmD Clinical Pharmacist [phone]

## 2023-08-14 ENCOUNTER — Other Ambulatory Visit (HOSPITAL_COMMUNITY): Payer: Self-pay

## 2023-08-16 ENCOUNTER — Other Ambulatory Visit: Payer: Self-pay

## 2023-08-17 ENCOUNTER — Other Ambulatory Visit (HOSPITAL_COMMUNITY): Payer: Self-pay

## 2023-08-18 ENCOUNTER — Other Ambulatory Visit (HOSPITAL_COMMUNITY): Payer: Self-pay

## 2023-08-19 ENCOUNTER — Other Ambulatory Visit: Payer: Self-pay

## 2023-08-19 ENCOUNTER — Other Ambulatory Visit (HOSPITAL_COMMUNITY): Payer: Self-pay

## 2023-08-19 ENCOUNTER — Other Ambulatory Visit (INDEPENDENT_AMBULATORY_CARE_PROVIDER_SITE_OTHER): Payer: Medicare Other

## 2023-08-19 DIAGNOSIS — N3281 Overactive bladder: Secondary | ICD-10-CM

## 2023-08-19 NOTE — Progress Notes (Signed)
08/19/2023 Name: [name] MRN: [id number] DOB: Dec 23, 1950  Chief Complaint  Patient presents with   Medication Management   Benign Prostatic Hypertrophy    ALBERTH FEEBACK is a 73 y.o. year old male who presented for a telephone visit.   They were referred to the pharmacist by their PCP for assistance in managing complex medication management. Patient and spouse called in requesting a medication review and cost evaluation.   Subjective:  Care Team: Primary Care Provider: Ronnald Nian, MD   Medication Access/Adherence  Current Pharmacy:  [facility] [address] [place] [address] Phone: [phone] Fax: [phone]   Patient reports affordability concerns with their medications: No  Patient reports access/transportation concerns to their pharmacy: No  Patient reports adherence concerns with their medications:  No     BPH/Overactive Bladder: -Current Medication Therapy: Gemtesa 75mg  once daily, Alfuzosin 10mg  -Past Medications: Oxybutynin, Vesicare, Myrbetriq -Reports Gemtesa just started earlier this week, haven't really noticed any difference yet -Gemtesa affordable but patient/spouse wonder if there is a cheaper alternative that would be as effective/safe  Objective:  No results found for: "HGBA1C"  Lab Results  Component Value Date   CREATININE 1.26 01/19/2023   BUN 27 01/19/2023   NA 136 01/19/2023   K 4.5 01/19/2023   CL 102 01/19/2023   CO2 23 01/19/2023    Lab Results  Component Value Date   CHOL 136 01/19/2023   HDL 55 01/19/2023   LDLCALC 72 01/19/2023   TRIG 37 01/19/2023   CHOLHDL 2.5 01/19/2023    Medications Reviewed Today     Reviewed by Sherrill Raring, RPH (Pharmacist) on 08/19/23 at 1939  Med List Status: <None>   Medication Order Taking? Sig Documenting Provider Last Dose Status Informant  acetaminophen (TYLENOL) 500 MG tablet [id number] Yes Take 2 tablets (1,000 mg total) by  mouth every 6 (six) hours as needed for mild pain. Henrene Dodge, MD Taking Active   alfuzosin (UROXATRAL) 10 MG 24 hr tablet [id number] Yes Take 1 tablet (10 mg total) by mouth at bedtime. Ronnald Nian, MD Taking Active   alfuzosin (UROXATRAL) 10 MG 24 hr tablet [id number]  Take 1 tablet (10 mg total) by mouth daily.   Active   atorvastatin (LIPITOR) 20 MG tablet [id number] Yes Take 1 tablet (20 mg total) by mouth daily. Ronnald Nian, MD Taking Active   carbidopa-levodopa (SINEMET IR) 25-100 MG tablet [id number] Yes Take 1&1/2 tablets by mouth before breakfast,  at noon and every evening as directed Ihor Austin, NP Taking Active   citalopram (CELEXA) 20 MG tablet [id number] Yes Take 1 tablet (20 mg total) by mouth at bedtime. Ronnald Nian, MD Taking Active   dorzolamide-timolol (COSOPT) 2-0.5 % ophthalmic solution [id number] Yes Place 1 drop into both eyes 2 (two) times daily.  Taking Active   dorzolamide-timolol (COSOPT) 22.3-6.8 MG/ML ophthalmic solution [id number]  Instill 1 drop into both eyes twice a day   Active   FIBER ADULT GUMMIES PO [id number] Yes Take 1 tablet by mouth 2 (two) times daily. [provider] Taking Active   ibuprofen (ADVIL) 600 MG tablet [id number]  Take 1 tablet (600 mg total) by mouth every 8 (eight) hours as needed for moderate pain. Henrene Dodge, MD  Active   ibuprofen (ADVIL) 600 MG tablet [id number] Yes Take 1 tablet (600 mg total) by mouth every 8 (eight) hours as needed for moderate pain.  Taking Active   ibuprofen (ADVIL)  600 MG tablet [id number]  Take 1 tablet (600 mg total) by mouth 3 (three) times daily as needed.   Active   influenza vaccine adjuvanted (FLUAD) 0.5 ML injection [id number]  Inject into the muscle. Judyann Munson, MD  Active   irbesartan (AVAPRO) 75 MG tablet [id number] Yes Take 1 tablet (75 mg total) by mouth every morning. Ronnald Nian, MD Taking Active   mirabegron ER [facility]) 50 MG TB24 tablet [id number]  Take 1 tablet (50 mg  total) by mouth daily.   Active   Multiple Vitamins-Minerals (MULTIVITAMIN ADULTS 50+ PO) [id number] Yes Take 1 tablet by mouth daily. [provider] Taking Active   MYRBETRIQ 50 MG TB24 tablet [id number]  Take 1 tablet (50 mg total) by mouth daily.   Active   Netarsudil Dimesylate (RHOPRESSA) 0.02 % SOLN [id number]  Place 1 drop into the left eye every evening.   Active            Med Note Leitha Bleak, Shanya Ferriss H   Thu Aug 19, 2023  7:39 PM) Reports rocklatan now  nitroGLYCERIN (NITROSTAT) 0.4 MG SL tablet [id number]  Place 1 tablet (0.4 mg total) under the tongue every 5 (five) minutes as needed for chest pain. Marykay Lex, MD  Active            Med Note Dione Housekeeper, Kathreen Cosier   Fri Jan 15, 2023  4:03 PM) Need refill  pantoprazole (PROTONIX) 40 MG tablet [id number] Yes Take 1 tablet (40 mg total) by mouth 2 (two) times daily. Ronnald Nian, MD Taking Active   rivastigmine (EXELON) 4.5 MG capsule [id number] Yes Take 1 capsule (4.5 mg total) by mouth 2 (two) times daily. Ihor Austin, NP Taking Active   trihexyphenidyl (ARTANE) 2 MG tablet [id number] Yes TAKE 1/2 TABLET DAILY FOR ONE WEEK, THEN TAKE 1/2 TABLET BY MOUTH TWICE DAILY WITH A MEAL McCue, Jessica, NP Taking Active   verapamil (CALAN-SR) 120 MG CR tablet [id number]  Take 1 tablet (120 mg total) by mouth every morning. Ronnald Nian, MD  Active   Vibegron [facility]) 75 MG TABS [id number] Yes Take 1 tablet (75 mg total) by mouth daily.  Taking Active               Assessment/Plan:   BPH/Overactive Bladder -Counseled that it can take up to 12 weeks to see the impact of Gemtesa -Reviewed cost and other medication treatment alternatives as patient is currently paying $100/30DS. -Does not qualify for PAP.  -Reviewed patient's comorbid conditions and agree to stay on Gemtesa as long as it remains affordable  Follow Up Plan: Will reach back out if any concerns  Sherrill Raring, PharmD Clinical Pharmacist [phone]

## 2023-08-20 ENCOUNTER — Other Ambulatory Visit: Payer: Self-pay

## 2023-08-20 ENCOUNTER — Other Ambulatory Visit (HOSPITAL_COMMUNITY): Payer: Self-pay

## 2023-08-21 ENCOUNTER — Other Ambulatory Visit (HOSPITAL_COMMUNITY): Payer: Self-pay

## 2023-08-23 ENCOUNTER — Other Ambulatory Visit (HOSPITAL_COMMUNITY): Payer: Self-pay

## 2023-08-23 ENCOUNTER — Other Ambulatory Visit: Payer: Self-pay

## 2023-08-24 ENCOUNTER — Other Ambulatory Visit: Payer: Self-pay

## 2023-08-24 ENCOUNTER — Other Ambulatory Visit (HOSPITAL_COMMUNITY): Payer: Self-pay

## 2023-08-25 ENCOUNTER — Other Ambulatory Visit: Payer: Self-pay

## 2023-08-25 ENCOUNTER — Other Ambulatory Visit (HOSPITAL_COMMUNITY): Payer: Self-pay

## 2023-08-26 ENCOUNTER — Other Ambulatory Visit (HOSPITAL_COMMUNITY): Payer: Self-pay

## 2023-08-26 ENCOUNTER — Other Ambulatory Visit: Payer: Self-pay

## 2023-08-28 ENCOUNTER — Other Ambulatory Visit (HOSPITAL_COMMUNITY): Payer: Self-pay

## 2023-08-30 ENCOUNTER — Other Ambulatory Visit (HOSPITAL_COMMUNITY): Payer: Self-pay

## 2023-08-30 ENCOUNTER — Other Ambulatory Visit: Payer: Self-pay

## 2023-08-31 ENCOUNTER — Other Ambulatory Visit: Payer: Self-pay

## 2023-08-31 ENCOUNTER — Other Ambulatory Visit (HOSPITAL_COMMUNITY): Payer: Self-pay

## 2023-08-31 DIAGNOSIS — H35372 Puckering of macula, left eye: Secondary | ICD-10-CM | POA: Diagnosis not present

## 2023-08-31 DIAGNOSIS — H401132 Primary open-angle glaucoma, bilateral, moderate stage: Secondary | ICD-10-CM | POA: Diagnosis not present

## 2023-08-31 DIAGNOSIS — H04123 Dry eye syndrome of bilateral lacrimal glands: Secondary | ICD-10-CM | POA: Diagnosis not present

## 2023-08-31 DIAGNOSIS — H31093 Other chorioretinal scars, bilateral: Secondary | ICD-10-CM | POA: Diagnosis not present

## 2023-08-31 DIAGNOSIS — H43812 Vitreous degeneration, left eye: Secondary | ICD-10-CM | POA: Diagnosis not present

## 2023-08-31 DIAGNOSIS — H01024 Squamous blepharitis left upper eyelid: Secondary | ICD-10-CM | POA: Diagnosis not present

## 2023-08-31 DIAGNOSIS — Z961 Presence of intraocular lens: Secondary | ICD-10-CM | POA: Diagnosis not present

## 2023-08-31 DIAGNOSIS — H4720 Unspecified optic atrophy: Secondary | ICD-10-CM | POA: Diagnosis not present

## 2023-08-31 DIAGNOSIS — H34832 Tributary (branch) retinal vein occlusion, left eye, with macular edema: Secondary | ICD-10-CM | POA: Diagnosis not present

## 2023-08-31 DIAGNOSIS — H18231 Secondary corneal edema, right eye: Secondary | ICD-10-CM | POA: Diagnosis not present

## 2023-08-31 DIAGNOSIS — H34812 Central retinal vein occlusion, left eye, with macular edema: Secondary | ICD-10-CM | POA: Diagnosis not present

## 2023-08-31 DIAGNOSIS — H01021 Squamous blepharitis right upper eyelid: Secondary | ICD-10-CM | POA: Diagnosis not present

## 2023-09-01 ENCOUNTER — Other Ambulatory Visit (HOSPITAL_COMMUNITY): Payer: Self-pay

## 2023-09-01 ENCOUNTER — Other Ambulatory Visit: Payer: Self-pay

## 2023-09-02 ENCOUNTER — Other Ambulatory Visit (HOSPITAL_COMMUNITY): Payer: Self-pay

## 2023-09-02 ENCOUNTER — Other Ambulatory Visit: Payer: Self-pay

## 2023-09-03 ENCOUNTER — Other Ambulatory Visit (HOSPITAL_COMMUNITY): Payer: Self-pay

## 2023-09-03 DIAGNOSIS — N3281 Overactive bladder: Secondary | ICD-10-CM | POA: Diagnosis not present

## 2023-09-04 ENCOUNTER — Other Ambulatory Visit: Payer: Self-pay

## 2023-09-06 ENCOUNTER — Other Ambulatory Visit: Payer: Self-pay | Admitting: Family Medicine

## 2023-09-06 ENCOUNTER — Other Ambulatory Visit (HOSPITAL_COMMUNITY): Payer: Self-pay

## 2023-09-06 ENCOUNTER — Other Ambulatory Visit: Payer: Self-pay

## 2023-09-07 ENCOUNTER — Other Ambulatory Visit (HOSPITAL_COMMUNITY): Payer: Self-pay

## 2023-09-07 ENCOUNTER — Other Ambulatory Visit: Payer: Self-pay

## 2023-09-08 ENCOUNTER — Other Ambulatory Visit (HOSPITAL_COMMUNITY): Payer: Self-pay

## 2023-09-08 ENCOUNTER — Other Ambulatory Visit: Payer: Self-pay | Admitting: Family Medicine

## 2023-09-08 ENCOUNTER — Other Ambulatory Visit: Payer: Self-pay

## 2023-09-08 ENCOUNTER — Encounter: Payer: Self-pay | Admitting: Adult Health

## 2023-09-08 DIAGNOSIS — G20C Parkinsonism, unspecified: Secondary | ICD-10-CM

## 2023-09-08 MED ORDER — IRBESARTAN 75 MG PO TABS
75.0000 mg | ORAL_TABLET | Freq: Every morning | ORAL | 2 refills | Status: DC
  Filled 2023-09-17 – 2023-09-21 (×2): qty 30, 30d supply, fill #0
  Filled 2023-09-30 – 2023-10-18 (×4): qty 30, 30d supply, fill #1
  Filled 2023-11-01 – 2023-11-11 (×2): qty 30, 30d supply, fill #2
  Filled ????-??-??: fill #1

## 2023-09-08 MED ORDER — CITALOPRAM HYDROBROMIDE 20 MG PO TABS
20.0000 mg | ORAL_TABLET | Freq: Every evening | ORAL | 1 refills | Status: DC
  Filled 2023-09-17 – 2023-09-21 (×2): qty 30, 30d supply, fill #0
  Filled 2023-09-30 – 2023-10-18 (×4): qty 30, 30d supply, fill #1
  Filled 2023-11-01 – 2023-11-11 (×2): qty 30, 30d supply, fill #2
  Filled 2023-11-29 – 2023-12-04 (×2): qty 30, 30d supply, fill #3
  Filled 2023-12-27 – 2024-01-04 (×3): qty 30, 30d supply, fill #4
  Filled 2024-01-24 – 2024-03-14 (×5): qty 30, 30d supply, fill #5
  Filled ????-??-??: fill #1

## 2023-09-08 NOTE — Telephone Encounter (Signed)
Yes, okay to place order to neuro rehab for PT and OT eval for PD. Thank you.

## 2023-09-09 ENCOUNTER — Other Ambulatory Visit: Payer: Self-pay

## 2023-09-11 ENCOUNTER — Other Ambulatory Visit (HOSPITAL_COMMUNITY): Payer: Self-pay

## 2023-09-15 ENCOUNTER — Other Ambulatory Visit: Payer: Self-pay

## 2023-09-16 ENCOUNTER — Other Ambulatory Visit (HOSPITAL_COMMUNITY): Payer: Self-pay

## 2023-09-17 ENCOUNTER — Other Ambulatory Visit: Payer: Self-pay

## 2023-09-17 ENCOUNTER — Other Ambulatory Visit (HOSPITAL_COMMUNITY): Payer: Self-pay

## 2023-09-17 MED ORDER — ROCKLATAN 0.02-0.005 % OP SOLN
1.0000 [drp] | Freq: Every day | OPHTHALMIC | 3 refills | Status: AC
Start: 2023-09-17 — End: ?
  Filled 2023-09-17: qty 2.5, 50d supply, fill #0
  Filled 2023-10-05: qty 2.5, 25d supply, fill #0
  Filled 2023-10-08 – 2023-12-27 (×3): qty 2.5, 50d supply, fill #0
  Filled 2024-03-14: qty 2.5, 50d supply, fill #1
  Filled 2024-06-01: qty 2.5, 50d supply, fill #2

## 2023-09-20 ENCOUNTER — Other Ambulatory Visit (HOSPITAL_COMMUNITY): Payer: Self-pay

## 2023-09-20 ENCOUNTER — Other Ambulatory Visit: Payer: Self-pay

## 2023-09-21 ENCOUNTER — Other Ambulatory Visit: Payer: Self-pay

## 2023-09-21 ENCOUNTER — Encounter: Payer: Self-pay | Admitting: Internal Medicine

## 2023-09-21 ENCOUNTER — Other Ambulatory Visit (HOSPITAL_COMMUNITY): Payer: Self-pay

## 2023-09-22 ENCOUNTER — Other Ambulatory Visit: Payer: Self-pay

## 2023-09-22 ENCOUNTER — Other Ambulatory Visit (HOSPITAL_COMMUNITY): Payer: Self-pay

## 2023-09-23 ENCOUNTER — Other Ambulatory Visit (HOSPITAL_COMMUNITY): Payer: Self-pay

## 2023-09-23 ENCOUNTER — Other Ambulatory Visit: Payer: Self-pay

## 2023-09-27 ENCOUNTER — Other Ambulatory Visit: Payer: Self-pay

## 2023-09-28 ENCOUNTER — Other Ambulatory Visit (HOSPITAL_COMMUNITY): Payer: Self-pay

## 2023-09-28 ENCOUNTER — Ambulatory Visit: Payer: Medicare Other | Admitting: Occupational Therapy

## 2023-09-28 ENCOUNTER — Encounter: Payer: Self-pay | Admitting: Occupational Therapy

## 2023-09-28 ENCOUNTER — Encounter: Payer: Self-pay | Admitting: Physical Therapy

## 2023-09-28 ENCOUNTER — Ambulatory Visit: Payer: Medicare Other | Attending: Adult Health | Admitting: Physical Therapy

## 2023-09-28 DIAGNOSIS — R41844 Frontal lobe and executive function deficit: Secondary | ICD-10-CM

## 2023-09-28 DIAGNOSIS — R41842 Visuospatial deficit: Secondary | ICD-10-CM

## 2023-09-28 DIAGNOSIS — M6281 Muscle weakness (generalized): Secondary | ICD-10-CM

## 2023-09-28 DIAGNOSIS — R251 Tremor, unspecified: Secondary | ICD-10-CM

## 2023-09-28 DIAGNOSIS — R278 Other lack of coordination: Secondary | ICD-10-CM

## 2023-09-28 DIAGNOSIS — R41840 Attention and concentration deficit: Secondary | ICD-10-CM | POA: Diagnosis present

## 2023-09-28 DIAGNOSIS — R2681 Unsteadiness on feet: Secondary | ICD-10-CM | POA: Insufficient documentation

## 2023-09-28 DIAGNOSIS — R29818 Other symptoms and signs involving the nervous system: Secondary | ICD-10-CM | POA: Insufficient documentation

## 2023-09-28 DIAGNOSIS — R2689 Other abnormalities of gait and mobility: Secondary | ICD-10-CM | POA: Insufficient documentation

## 2023-09-28 NOTE — Therapy (Signed)
 OUTPATIENT PHYSICAL THERAPY NEURO EVALUATION   Patient Name: [name] MRN: [id number] DOB:1951/04/19, 73 y.o., male Today's Date: 09/28/2023   PCP: Ronnald Nian, MD REFERRING PROVIDER: Ihor Austin, NP  END OF SESSION:  PT End of Session - 09/28/23 1451     Visit Number 1    Number of Visits 13    Date for PT Re-Evaluation 11/16/23    Authorization Type [employer] Medicare    PT Start Time 1447    PT Stop Time 1530    PT Time Calculation (min) 43 min    Activity Tolerance Patient tolerated treatment well    Behavior During Therapy WFL for tasks assessed/performed             Past Medical History:  Diagnosis Date   Allergy    seasonal   Arthritis    BPH (benign prostatic hyperplasia)    Complication of anesthesia    Pt. stated he had a reaction that ended in him requiring urinary cath placement; hx delirium worsening parkinson symptoms   Diverticulosis    GERD (gastroesophageal reflux disease)    esophageal spasms   Glaucoma    Gout    Head injury, closed, with concussion    Hepatitis C    chronic - Has been treated with Harvoni   HLD (hyperlipidemia)    statin intolerant (Crestor & Simvastatin) - Taking Livalo 1mg  / week   Hypertension    Parkinson's disease (HCC)    Plantar fasciitis    right   PVD (peripheral vascular disease) (HCC)    With no claudication; only mild abdominal aortic atherosclerosis noted on ultrasound.   Spinal stenosis of lumbar region    Thoracic ascending aortic aneurysm (HCC)    4.2 cm ascending TAA 09/2016 CT, 1 yr f/u rec   Ulcer    Past Surgical History:  Procedure Laterality Date   BUNIONECTOMY WITH WEIL OSTEOTOMY Right 11/02/2019   Procedure: Right Foot Lapidus, Modified McBride Bunionectomy,  Hallux Akin Osteotomy;  Surgeon: Toni Arthurs, MD;  Location: [facility];  Service: Orthopedics;  Laterality: Right;   CARDIAC CATHETERIZATION  2005   30% Cx. Dr. Elsie Lincoln   cataract surgery Left 11/05/2014    COLONOSCOPY     COLONOSCOPY N/A 01/09/2021   Procedure: COLONOSCOPY;  Surgeon: Kerin Salen, MD;  Location: [facility];  Service: Gastroenterology;  Laterality: N/A;   ESOPHAGOGASTRODUODENOSCOPY N/A 05/02/2015   Procedure: ESOPHAGOGASTRODUODENOSCOPY (EGD);  Surgeon: Bernette Redbird, MD;  Location: [facility];  Service: Endoscopy;  Laterality: N/A;   ESOPHAGOGASTRODUODENOSCOPY  05/02/2015   no source of pt chest pain endoscopically evident. small hiatal hernia.   ESOPHAGOGASTRODUODENOSCOPY (EGD) WITH PROPOFOL N/A 01/02/2021   Procedure: ESOPHAGOGASTRODUODENOSCOPY (EGD) WITH PROPOFOL;  Surgeon: Vida Rigger, MD;  Location: [facility];  Service: Endoscopy;  Laterality: N/A;   EYE SURGERY     HARDWARE REMOVAL Right 11/02/2019   Procedure: Second Metatarsal Removal of Deep Implant and Rotational Osteotomy;  Surgeon: Toni Arthurs, MD;  Location: [facility];  Service: Orthopedics;  Laterality: Right;   IR ANGIOGRAM SELECTIVE EACH ADDITIONAL VESSEL  01/04/2021   IR ANGIOGRAM SELECTIVE EACH ADDITIONAL VESSEL  01/04/2021   IR ANGIOGRAM VISCERAL SELECTIVE  01/04/2021   IR US GUIDE VASC ACCESS RIGHT  01/04/2021   LUMBAR LAMINECTOMY/DECOMPRESSION MICRODISCECTOMY N/A 07/03/2021   Procedure: Lumbar three through five decompression with lumbar three through five insitu fusion;  Surgeon: Venita Lick, MD;  Location: [facility] OR;  Service: Orthopedics;  Laterality: N/A;   MEMBRANE  PEEL Left 03/14/2014   Procedure: MEMBRANE PEEL; ENDOLASER;  Surgeon: Edmon Crape, MD;  Location: [facility] OR;  Service: Ophthalmology;  Laterality: Left;   NM MYOVIEW LTD  10/2015   LOW RISK. Small, fixed basal lateral defect - likely diaphragmatic attenuation. 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  No R WMA.  GRII DD-moderately dilated LA..  Mildly dilated RV but normal function.  Normal RAP/CVP.  Trivial AI with mild to moderate sclerosis-no stenosis   UMBILICAL HERNIA REPAIR  2023   UPPER GASTROINTESTINAL ENDOSCOPY     WEIL OSTEOTOMY Right 09/01/2017   Procedure: RIGHT GREAT TOE CHEVRON AND WEIL OSTEOTOMY 2ND METATARSAL;  Surgeon: Nadara Mustard, MD;  Location: [facility] OR;  Service: Orthopedics;  Laterality: Right;   XI ROBOTIC ASSISTED VENTRAL HERNIA N/A 03/17/2022   Procedure: XI ROBOTIC ASSISTED VENTRAL HERNIA;  Surgeon: Henrene Dodge, MD;  Location: [facility] ORS;  Service: General;  Laterality: N/A;   Patient Active Problem List   Diagnosis Date Noted   Ventral hernia without obstruction or gangrene    Primary open angle glaucoma of left eye, mild stage 06/23/2021   Facial numbness 12/24/2020   Neovascular glaucoma, right eye, stage unspecified 12/16/2020   Lumbar radiculopathy 11/25/2020   Gastroesophageal reflux disease 09/10/2020   Dry eyes, bilateral 08/15/2020   Presbycusis of right ear 01/17/2020   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   Secondary corneal edema of right eye 12/12/2019   Ptosis of right eyelid 12/12/2019   OAB (overactive bladder) 11/29/2019   Bunion of great toe of right foot 07/14/2017   Claw toe, acquired, right 07/14/2017   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   Medication management 10/09/2016   Chest pain at rest 07/04/2015   Plantar fasciitis of right foot 02/19/2015   Depression 08/14/2014   Family history of heart disease in male family member before age 77 04/26/2014   Mild cognitive impairment 03/13/2014   Parkinsonian tremor (HCC) 02/12/2014   Hyperlipidemia with target LDL less than 100    Abdominal aortic atherosclerosis (HCC)    Moderate essential hypertension 02/25/2011   Arthropathy 02/25/2011   HAV (hallux  abducto valgus) 02/25/2011    ONSET DATE: 09/08/2023 (referral)   REFERRING DIAG: G20.C (ICD-10-CM) - Primary parkinsonism (HCC)  THERAPY DIAG:  Unsteadiness on feet  Muscle weakness (generalized)  Other abnormalities of gait and mobility  Rationale for Evaluation and Treatment: Rehabilitation  SUBJECTIVE:  SUBJECTIVE STATEMENT: Pt presents without AD. States he has difficulty w/dressing, swallowing, decision-making. "My balance is terrible". Has had surgeries on his feet and spinal fusion of L3-L5 2 years ago. Has participated in PD dance class, aquatic therapy, chair yoga. Has significant R foot pain and low back pain. Taking Sinemet 3x/day   Pt accompanied by:  Wife, [name]   PERTINENT HISTORY:  tremor (2014), parkinsonism, allergies, arthritis, reflux disease, glaucoma, gout, hepatitis C, hyperlipidemia, hypertension, plantar fasciitis, peripheral vascular disease, and BPH, admitted 12/8 for L3-5 spinal fusion   PAIN:  Are you having pain? Yes: NPRS scale: 6/10 in back, 1-2/10 Pain location: R toes and low back Pain description: Achy  Aggravating factors: Weightbearing, bending over, lifting Relieving factors: Pain medication, heat, ice  PRECAUTIONS: Fall and Other: blind in R eye  RED FLAGS: None   WEIGHT BEARING RESTRICTIONS: No  FALLS: Has patient fallen in last 6 months? No and reports frequent near misses   LIVING ENVIRONMENT: Lives with: lives with their spouse Lives in: House/apartment Stairs: Yes: Internal: 2 flights steps; on left going up and External: 1 in front and 3 in side steps; none Has following equipment at home: Single point cane, Walker - 2 wheeled, shower chair, and Grab bars  PLOF: Needs assistance with ADLs and Needs assistance with homemaking  PATIENT  GOALS: "Just wanting to have as much control as I can"   OBJECTIVE:  Note: Objective measures were completed at Evaluation unless otherwise noted.  DIAGNOSTIC FINDINGS: ***  COGNITION: Overall cognitive status: Impaired   SENSATION: Pt reports occasional numbness/tingling in BLEs   COORDINATION: ***  MUSCLE TONE: LLE: Rigidity   DTRs:  {DTR SITE:24025}  POSTURE: rounded shoulders, forward head, increased thoracic kyphosis, flexed trunk , and weight shift left  LOWER EXTREMITY ROM:     Active  Right Eval Left Eval  Hip flexion    Hip extension    Hip abduction    Hip adduction    Hip internal rotation    Hip external rotation    Knee flexion    Knee extension    Ankle dorsiflexion    Ankle plantarflexion    Ankle inversion    Ankle eversion     (Blank rows = not tested)  LOWER EXTREMITY MMT:  Tested in seated position   MMT Right Eval Left Eval  Hip flexion    Hip extension    Hip abduction    Hip adduction    Hip internal rotation    Hip external rotation    Knee flexion    Knee extension    Ankle dorsiflexion    Ankle plantarflexion    Ankle inversion    Ankle eversion    (Blank rows = not tested)  BED MOBILITY:  Pt reports independence w/this but it is difficult due to elevated height of the bed and foot pain   TRANSFERS: Assistive device utilized: None  Sit to stand: {Levels of assistance:24026} Stand to sit: {Levels of assistance:24026} Chair to chair: {Levels of assistance:24026}  GAIT: Gait pattern: {gait characteristics:25376} Distance walked: *** Assistive device utilized: {Assistive devices:23999} Level of assistance: {Levels of assistance:24026} Comments: ***  FUNCTIONAL TESTS:   [facility] PT Assessment - 09/28/23 1518       Transfers   Five time sit to stand comments  19.55s   No UE support, weight shift to R     Balance   Balance Assessed Yes      Standardized Balance Assessment   Standardized Balance  Assessment 10 meter  walk test    10 Meter Walk 14m/s   10.01s w/no AD, min A required due to LOB w/turn                                                                                                                                          TREATMENT DATE: ***    PATIENT EDUCATION: Education details: *** Person educated: {Person educated:25204} Education method: {Education Method:25205} Education comprehension: {Education Comprehension:25206}  HOME EXERCISE PROGRAM: ***  GOALS: Goals reviewed with patient? Yes  SHORT TERM GOALS: Target date: ***  *** Baseline: Goal status: INITIAL  2.  *** Baseline:  Goal status: INITIAL  3.  *** Baseline:  Goal status: INITIAL  4.  *** Baseline:  Goal status: INITIAL  5.  *** Baseline:  Goal status: INITIAL  6.  *** Baseline:  Goal status: INITIAL  LONG TERM GOALS: Target date: ***  *** Baseline:  Goal status: INITIAL  2.  *** Baseline:  Goal status: INITIAL  3.  *** Baseline:  Goal status: INITIAL  4.  *** Baseline:  Goal status: INITIAL  5.  *** Baseline:  Goal status: INITIAL  6.  *** Baseline:  Goal status: INITIAL  ASSESSMENT:  CLINICAL IMPRESSION: Patient is a 73 year old male referred to [facility] for parkinsonism. Pt's PMH is significant for:  tremor, parkinsonism, allergies, arthritis, reflux disease, glaucoma, gout, hepatitis C, hyperlipidemia, hypertension, plantar fasciitis, peripheral vascular disease, and BPH. The following deficits were present during the exam: ***. Based on ***, pt is an incr risk for falls. Pt would benefit from skilled PT to address these impairments and functional limitations to maximize functional mobility independence   OBJECTIVE IMPAIRMENTS: {opptimpairments:25111}.   ACTIVITY LIMITATIONS: {activitylimitations:27494}  PARTICIPATION LIMITATIONS: {participationrestrictions:25113}  PERSONAL FACTORS: {Personal factors:25162} are also affecting patient's functional outcome.    REHAB POTENTIAL: {rehabpotential:25112}  CLINICAL DECISION MAKING: {clinical decision making:25114}  EVALUATION COMPLEXITY: {Evaluation complexity:25115}  PLAN:  PT FREQUENCY: 1-2x/week  PT DURATION: 6 weeks  PLANNED INTERVENTIONS: {rehab planned interventions:25118::"97110-Therapeutic exercises","97530- Therapeutic [phone]- Neuromuscular re-education","97535- Self JXBJ","47829- Manual therapy"}  PLAN FOR NEXT SESSION: ***   Jalyric Kaestner E Terria Deschepper, PT, DPT 09/28/2023, 3:32 PM

## 2023-09-28 NOTE — Therapy (Signed)
 OUTPATIENT OCCUPATIONAL THERAPY PARKINSON'S EVALUATION  Patient Name: EHTAN DELFAVERO MRN: [id number] DOB:October 01, 1950, 73 y.o., male Today's Date: 09/28/2023  PCP: Ronnald Nian, MD REFERRING PROVIDER: Ihor Austin, NP  END OF SESSION:  OT End of Session - 09/28/23 1528     Visit Number 1    Number of Visits 13   including eval/tx   Date for OT Re-Evaluation 11/24/23    Authorization Type [employer] 2025    Authorization Time Period VL:MN Auth Required    Progress Note Due on Visit 10    OT Start Time 1530    OT Stop Time 1635    OT Time Calculation (min) 65 min    Equipment Utilized During Treatment Testing Material    Activity Tolerance Patient tolerated treatment well    Behavior During Therapy WFL for tasks assessed/performed             Past Medical History:  Diagnosis Date   Allergy    seasonal   Arthritis    BPH (benign prostatic hyperplasia)    Complication of anesthesia    Pt. stated he had a reaction that ended in him requiring urinary cath placement; hx delirium worsening parkinson symptoms   Diverticulosis    GERD (gastroesophageal reflux disease)    esophageal spasms   Glaucoma    Gout    Head injury, closed, with concussion    Hepatitis C    chronic - Has been treated with Harvoni   HLD (hyperlipidemia)    statin intolerant (Crestor & Simvastatin) - Taking Livalo 1mg  / week   Hypertension    Parkinson's disease (HCC)    Plantar fasciitis    right   PVD (peripheral vascular disease) (HCC)    With no claudication; only mild abdominal aortic atherosclerosis noted on ultrasound.   Spinal stenosis of lumbar region    Thoracic ascending aortic aneurysm (HCC)    4.2 cm ascending TAA 09/2016 CT, 1 yr f/u rec   Ulcer    Past Surgical History:  Procedure Laterality Date   BUNIONECTOMY WITH WEIL OSTEOTOMY Right 11/02/2019   Procedure: Right Foot Lapidus, Modified McBride Bunionectomy,  Hallux Akin Osteotomy;  Surgeon: Toni Arthurs, MD;  Location:  [facility];  Service: Orthopedics;  Laterality: Right;   CARDIAC CATHETERIZATION  2005   30% Cx. Dr. Elsie Lincoln   cataract surgery Left 11/05/2014   COLONOSCOPY     COLONOSCOPY N/A 01/09/2021   Procedure: COLONOSCOPY;  Surgeon: Kerin Salen, MD;  Location: [facility];  Service: Gastroenterology;  Laterality: N/A;   ESOPHAGOGASTRODUODENOSCOPY N/A 05/02/2015   Procedure: ESOPHAGOGASTRODUODENOSCOPY (EGD);  Surgeon: Bernette Redbird, MD;  Location: [facility];  Service: Endoscopy;  Laterality: N/A;   ESOPHAGOGASTRODUODENOSCOPY  05/02/2015   no source of pt chest pain endoscopically evident. small hiatal hernia.   ESOPHAGOGASTRODUODENOSCOPY (EGD) WITH PROPOFOL N/A 01/02/2021   Procedure: ESOPHAGOGASTRODUODENOSCOPY (EGD) WITH PROPOFOL;  Surgeon: Vida Rigger, MD;  Location: [facility];  Service: Endoscopy;  Laterality: N/A;   EYE SURGERY     HARDWARE REMOVAL Right 11/02/2019   Procedure: Second Metatarsal Removal of Deep Implant and Rotational Osteotomy;  Surgeon: Toni Arthurs, MD;  Location: [facility];  Service: Orthopedics;  Laterality: Right;   IR ANGIOGRAM SELECTIVE EACH ADDITIONAL VESSEL  01/04/2021   IR ANGIOGRAM SELECTIVE EACH ADDITIONAL VESSEL  01/04/2021   IR ANGIOGRAM VISCERAL SELECTIVE  01/04/2021   IR US GUIDE VASC ACCESS RIGHT  01/04/2021   LUMBAR LAMINECTOMY/DECOMPRESSION MICRODISCECTOMY N/A 07/03/2021   Procedure: Lumbar  three through five decompression with lumbar three through five insitu fusion;  Surgeon: Venita Lick, MD;  Location: [facility] OR;  Service: Orthopedics;  Laterality: N/A;   MEMBRANE PEEL Left 03/14/2014   Procedure: MEMBRANE PEEL; ENDOLASER;  Surgeon: Edmon Crape, MD;  Location: [facility] OR;  Service: Ophthalmology;  Laterality: Left;   NM MYOVIEW LTD  10/2015   LOW RISK. Small, fixed basal lateral defect - likely diaphragmatic attenuation. 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  No R WMA.  GRII DD-moderately dilated LA..  Mildly dilated RV but normal function.  Normal RAP/CVP.  Trivial AI with mild to moderate sclerosis-no stenosis   UMBILICAL HERNIA REPAIR  2023   UPPER GASTROINTESTINAL ENDOSCOPY     WEIL OSTEOTOMY Right 09/01/2017   Procedure: RIGHT GREAT TOE CHEVRON AND WEIL OSTEOTOMY 2ND METATARSAL;  Surgeon: Nadara Mustard, MD;  Location: [facility] OR;  Service: Orthopedics;  Laterality: Right;   XI ROBOTIC ASSISTED VENTRAL HERNIA N/A 03/17/2022   Procedure: XI ROBOTIC ASSISTED VENTRAL HERNIA;  Surgeon: Henrene Dodge, MD;  Location: [facility]S;  Service: General;  Laterality: N/A;   Patient Active Problem List   Diagnosis Date Noted   Ventral hernia without obstruction or gangrene    Primary open angle glaucoma of left eye, mild stage 06/23/2021   Facial numbness 12/24/2020   Neovascular glaucoma, right eye, stage unspecified 12/16/2020   Lumbar radiculopathy 11/25/2020   Gastroesophageal reflux disease 09/10/2020   Dry eyes, bilateral 08/15/2020   Presbycusis of right ear 01/17/2020   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   Secondary corneal edema of right eye 12/12/2019   Ptosis of right eyelid 12/12/2019   OAB (overactive bladder) 11/29/2019   Bunion of great toe of right foot 07/14/2017   Claw toe, acquired, right 07/14/2017   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   Medication management 10/09/2016   Chest pain at rest 07/04/2015   Plantar fasciitis of right foot 02/19/2015   Depression 08/14/2014   Family history of heart disease in male family member before age 36 04/26/2014   Mild cognitive impairment 03/13/2014   Parkinsonian tremor (HCC) 02/12/2014   Hyperlipidemia  with target LDL less than 100    Abdominal aortic atherosclerosis (HCC)    Moderate essential hypertension 02/25/2011   Arthropathy 02/25/2011   HAV (hallux abducto valgus) 02/25/2011   ONSET DATE: 09/20/2023  REFERRING DIAG: G20.C (ICD-10-CM) - Parkinsonism, unspecified  THERAPY DIAG:  Other lack of coordination  Muscle weakness (generalized)  Visuospatial deficit  Other symptoms and signs involving the nervous system  Tremor  Frontal lobe and executive function deficit  Rationale for Evaluation and Treatment: Rehabilitation  SUBJECTIVE:   SUBJECTIVE STATEMENT:  Pt accompanied by: self and Spouse  - Britta Mccreedy ("Bee")  Pt and spouse report that he has been diagnosed with Parkinson's - that wording has gone between Parkinsonism and Parkinson's with more recent conversations with doctors being Parkinson's.   Per P.T. evaluation - Pt stated he has difficulty with dressing, swallowing, decision-making. "My balance is terrible". Has had surgeries on his feet and spinal fusion of L3-L5 2 years ago. Has participated in PD dance class,  aquatic therapy, chair yoga. Has significant R foot pain and low back pain. Taking Sinemet 3x/day     PERTINENT HISTORY: Tremor (2015), parkinsonism, allergies, arthritis, reflux disease, glaucoma, gout, hepatitis C, hyperlipidemia, hypertension, plantar fasciitis, peripheral vascular disease, and BPH, admitted 07/03/2021 for L3-5 spinal fusion Back, foot, hernia, neuro surgery (pains)  PRECAUTIONS: Fall and Other: Vision loss R eye  WEIGHT BEARING RESTRICTIONS: No  PAIN: Pain in back - everyday, took medication before coming today  Are you having pain? Yes: NPRS scale: 5-6/10 Pain location: Lower back (every day) Pain description: If I get up - can be sharp pains, sometimes aching  Aggravating factors: Moving, bending, lifting Relieving factors: Ibuprofen, acetaminophen resting  FALLS: Has patient fallen in last 6 months? No - near misses - ie)  getting out of the chair  LIVING ENVIRONMENT: Lives with: lives with their spouse Lives in: House/apartment Stairs: Yes: Internal: 14+ steps; on left going up and External: 2-3 steps; can reach both Has following equipment at home: Single point cane, Walker - 2 wheeled, shower chair, Grab bars, and elevated toilet seat, walk in shower with removable seat - not using it at this time  PLOF: Independent with basic ADLs, Independent with community mobility without device, Needs assistance with homemaking, and Not driving  PATIENT GOALS: Pt reported that he wanted to be able to improve memory/recall, regain/improve balance ie) to minimize getting frozen, keep up/improve strength ie) to open doors for wife, to be able to take a container off the shelf, get the bottles of water from the car etc.  OBJECTIVE:  Note: Objective measures were completed at Evaluation unless otherwise noted.  HAND DOMINANCE: Right  ADLs: Overall ADLs: Occasional support from spouse Transfers/ambulation related to ADLs: Ind/Mod I Eating: Pt can cut some foods himself but may get help Grooming: Pt shaves himself - uses regular razor UB Dressing: Ind - may get help to pull his jacket off LB Dressing: Ind Toileting: Ind - uses regular undergarments, but has used pull-ups at night but is not using them at this time Bathing: Showers a couple of times/week and stands in the shower despite having a shower stool available Tub Shower transfers: Mod I - has grab bars Equipment: Shower seat without back, Grab bars, Walk in shower, and Reacher  IADLs: Not driving due to vision trouble - ie) limited peripheral vision Shopping: Goes with spouse Light housekeeping: Pt reports helping to wash dishes Meal Prep: Helps spouse but is slower getting things done.  He likes to cook breakfast. Community mobility: Ind without AE Medication management: Spouse helps take care of meds Financial management: Spouse does this  Handwriting:  50-75% legible - By pt report "it sucks."  Pt had improved legibility with printing verus writing.  MOBILITY STATUS: Independent and remote h/o falls  POSTURE COMMENTS:  rounded shoulders, forward head, increased thoracic kyphosis, flexed trunk , and weight shift left   ACTIVITY TOLERANCE: Activity tolerance: Good  FUNCTIONAL OUTCOME MEASURES: Fastening/unfastening 3 buttons: 46.24 sec Physical performance test: PPT#2 (simulated eating) 16.96 & PPT#4 (donning/doffing jacket): 28.58 sec  COORDINATION: 9 Hole Peg test: Right: 31.54 sec; Left: 34.65 sec Box and Blocks:  Right 38 blocks, Left 38 blocks  UE ROM:  WFL - slight end range limitations  UE MMT:   WFL - 4-/5 Grip Strength:  Right: 53.1, 56.4, 55.7 - Average 55.1 lbs Left:  42.7, 51.3, 48.2 - Average 47.4 lbs  SENSATION: Pt reports his fingertips get cold and "turn blue", (not thumbs)  MUSCLE TONE: WFL  COGNITION: Overall cognitive status:  slower processing by report ie) gets frozen and asks for frequent feedback from spouse for memory  PD OBSERVATIONS: Dyskinesias, Postural tremors, and pt and spouse report some freezing   VISION: Pt has history of glaucoma and some eye injections with loss of peripheral vision, R eye limitations and confirms that he has used magnify glass.  Pt has Iphone.                                                                                                                    TREATMENT DATE: 09/28/23   Self care: Education initiated with spouse and pt regarding OT role/POC considerations and treatment plans ie) HEPs to be provided, recommendation to get a binder to help with organizing education materials, initial ideas to help with memory/recall ie) stating things back to spouse and writing things down as well as using big, deliberate movements with B UE to decrease stiffness, increase digital ROM/extension, decrease tremulous motions and overall increase UE coordination for different fine  motor activities.    PATIENT EDUCATION: Education details: OT role, POC consideration and tx progress (see above) Person educated: Patient and Spouse Education method: Explanation and Verbal cues Education comprehension: verbalized understanding, verbal cues required, and needs further education  HOME EXERCISE PROGRAM: TBD  GOALS: Goals reviewed with patient? Yes GOALS:  SHORT TERM GOALS: Target date: 10/22/23  Pt will be independent with PD specific HEP. Baseline: not yet initiated Goal status: INITIAL  2.  Pt will independently recall and demonstrate at least 3 compensatory strategies/adaptations for visual impairment/memory aide without cueing including technology options with IPhone  Baseline: New to outpt OT - h/o glaucoma, has used magnify glass Goal status: INITIAL  3.  Pt will improve confidence with using his own strength ie) to open doors for wife, to be able to take a container off the shelf, and carry items from the car  Baseline: UE strength: 4-/5; Grip: Right: 55.1 lbs Left: 47.4 lbs Goal status: INITIAL  4.  Pt will write a grocery list x 10 words with no significant decrease in size and maintain 75% legibility. Baseline: 50-75% legibility Goal status: INITIAL  5.  Pt will demonstrate fine motor coordination for ADLs as evidenced by no decline and/or improvement in 9 hole peg test score and Box and Blocks Test for BUE. Baseline: 9 Hole: Right: 31.54 sec; Left: 34.65 sec  Box and Blocks: Right 38 blocks, Left 38 blocks Goal status: INITIAL   LONG TERM GOALS: Target date: 12/21/2023    Pt will verbalize understanding of ways to prevent future PD related complications and PD community resources. Baseline: not yet initiated Goal status: INITIAL  2.  Pt will verbalize understanding of ways to keep thinking skills sharp/WARM strategies and ways to compensate for STM changes now and in the future, including use of Memory Notebook. Baseline: not yet initiated Goal  status: INITIAL  3.  Pt will verbalize understanding of adapted strategies to maximize safety, independence  and timeliness with ADLs/IADLs. Baseline: Physical performance test: PPT#2 (simulated eating) 16.96 & PPT#4 (donning/doffing jacket): 28.58 sec Goal status: INITIAL  4.  Pt will demonstrate improved FM skills for max ease with fastening buttons as evidenced by decreasing 3 button/unbutton time by 5+ seconds Baseline: 46.24 secs Goal status: INITIAL    ASSESSMENT:  CLINICAL IMPRESSION: Patient is a 73 y.o. male who was seen today for occupational therapy evaluation for Parkinson's. Hx includes HTN, GERD, lumbar radiculopathy, spinal stenosis, tremors and glaucoma,  . Patient currently presents with gradual decline in baseline level of function with ADLS demonstrating functional deficits and impairments in strength, coordination, speed and safety with tasks. Pt would benefit from skilled OT services in the outpatient setting to work on impairments as noted below to help pt return to PLOF as able.    PERFORMANCE DEFICITS: in functional skills including ADLs, IADLs, coordination, dexterity, proprioception, sensation, tone, ROM, strength, pain, flexibility, Fine motor control, Gross motor control, mobility, balance, body mechanics, endurance, continence, decreased knowledge of precautions, decreased knowledge of use of DME, vision, and UE functional use, cognitive skills including attention, energy/drive, learn, memory, problem solving, safety awareness, thought, and understand, and psychosocial skills including coping strategies, environmental adaptation, and routines and behaviors.   IMPAIRMENTS: are limiting patient from ADLs, IADLs, and leisure.   COMORBIDITIES:  has co-morbidities such as glaucoma, history of back and foot surgery, HTN  that affects occupational performance. Patient will benefit from skilled OT to address above impairments and improve overall function.  MODIFICATION OR  ASSISTANCE TO COMPLETE EVALUATION: Min-Moderate modification of tasks or assist with assess necessary to complete an evaluation.  OT OCCUPATIONAL PROFILE AND HISTORY: Detailed assessment: Review of records and additional review of physical, cognitive, psychosocial history related to current functional performance.  CLINICAL DECISION MAKING: Moderate - several treatment options, min-mod task modification necessary  REHAB POTENTIAL: Good  EVALUATION COMPLEXITY: Moderate    PLAN:  OT FREQUENCY: 2x/week  OT DURATION: 6 weeks  PLANNED INTERVENTIONS: 97168 OT Re-evaluation, 97535 self care/ADL training, 60454 therapeutic exercise, 97530 therapeutic activity, 97112 neuromuscular re-education, [phone] aquatic therapy, balance training, functional mobility training, visual/perceptual remediation/compensation, psychosocial skills training, energy conservation, coping strategies training, patient/family education, and DME and/or AE instructions  RECOMMENDED OTHER SERVICES:   CONSULTED AND AGREED WITH PLAN OF CARE: Patient and family member/caregiver  PLAN FOR NEXT SESSION:  Begin HEP training - strength, coordination and vision Pwr! Moves WARM memory strategies Memory Note Book  Visual comp strategies ADL comp strategies   Victorino Sparrow, OT 09/28/2023, 5:43 PM

## 2023-09-29 ENCOUNTER — Other Ambulatory Visit: Payer: Self-pay | Admitting: Adult Health

## 2023-09-29 ENCOUNTER — Telehealth: Payer: Self-pay | Admitting: Physical Therapy

## 2023-09-29 DIAGNOSIS — G3184 Mild cognitive impairment, so stated: Secondary | ICD-10-CM

## 2023-09-29 DIAGNOSIS — G20C Parkinsonism, unspecified: Secondary | ICD-10-CM

## 2023-09-30 ENCOUNTER — Other Ambulatory Visit: Payer: Self-pay

## 2023-10-04 ENCOUNTER — Other Ambulatory Visit (HOSPITAL_COMMUNITY): Payer: Self-pay

## 2023-10-04 DIAGNOSIS — H401123 Primary open-angle glaucoma, left eye, severe stage: Secondary | ICD-10-CM | POA: Diagnosis not present

## 2023-10-04 DIAGNOSIS — H18231 Secondary corneal edema, right eye: Secondary | ICD-10-CM | POA: Diagnosis not present

## 2023-10-04 DIAGNOSIS — H04123 Dry eye syndrome of bilateral lacrimal glands: Secondary | ICD-10-CM | POA: Diagnosis not present

## 2023-10-04 DIAGNOSIS — H4089 Other specified glaucoma: Secondary | ICD-10-CM | POA: Diagnosis not present

## 2023-10-04 DIAGNOSIS — H538 Other visual disturbances: Secondary | ICD-10-CM | POA: Diagnosis not present

## 2023-10-04 DIAGNOSIS — H34832 Tributary (branch) retinal vein occlusion, left eye, with macular edema: Secondary | ICD-10-CM | POA: Diagnosis not present

## 2023-10-05 ENCOUNTER — Other Ambulatory Visit (HOSPITAL_COMMUNITY): Payer: Self-pay

## 2023-10-05 ENCOUNTER — Ambulatory Visit: Admitting: Physical Therapy

## 2023-10-05 ENCOUNTER — Ambulatory Visit: Admitting: Occupational Therapy

## 2023-10-05 VITALS — BP 111/75 | HR 55

## 2023-10-05 DIAGNOSIS — R2681 Unsteadiness on feet: Secondary | ICD-10-CM

## 2023-10-05 DIAGNOSIS — R278 Other lack of coordination: Secondary | ICD-10-CM

## 2023-10-05 DIAGNOSIS — R2689 Other abnormalities of gait and mobility: Secondary | ICD-10-CM

## 2023-10-05 DIAGNOSIS — M6281 Muscle weakness (generalized): Secondary | ICD-10-CM | POA: Diagnosis not present

## 2023-10-05 DIAGNOSIS — R251 Tremor, unspecified: Secondary | ICD-10-CM | POA: Diagnosis not present

## 2023-10-05 DIAGNOSIS — R29818 Other symptoms and signs involving the nervous system: Secondary | ICD-10-CM | POA: Diagnosis not present

## 2023-10-05 NOTE — Therapy (Unsigned)
 OUTPATIENT OCCUPATIONAL THERAPY PARKINSON'S TREATMENT  Patient Name: KHARI MALLY MRN: [id number] DOB:24-Jan-1951, 73 y.o., male Today's Date: 10/05/2023  PCP: Ronnald Nian, MD REFERRING PROVIDER: Ihor Austin, NP  END OF SESSION:  OT End of Session - 10/05/23 1404     Visit Number 2    Number of Visits 13   including eval/tx   Date for OT Re-Evaluation 11/24/23    Authorization Type [employer] 2025    Authorization Time Period VL:MN Auth Required    Progress Note Due on Visit 10    OT Start Time 1402    OT Stop Time 1445    OT Time Calculation (min) 43 min    Activity Tolerance Patient tolerated treatment well    Behavior During Therapy WFL for tasks assessed/performed             Past Medical History:  Diagnosis Date   Allergy    seasonal   Arthritis    BPH (benign prostatic hyperplasia)    Complication of anesthesia    Pt. stated he had a reaction that ended in him requiring urinary cath placement; hx delirium worsening parkinson symptoms   Diverticulosis    GERD (gastroesophageal reflux disease)    esophageal spasms   Glaucoma    Gout    Head injury, closed, with concussion    Hepatitis C    chronic - Has been treated with Harvoni   HLD (hyperlipidemia)    statin intolerant (Crestor & Simvastatin) - Taking Livalo 1mg  / week   Hypertension    Parkinson's disease (HCC)    Plantar fasciitis    right   PVD (peripheral vascular disease) (HCC)    With no claudication; only mild abdominal aortic atherosclerosis noted on ultrasound.   Spinal stenosis of lumbar region    Thoracic ascending aortic aneurysm (HCC)    4.2 cm ascending TAA 09/2016 CT, 1 yr f/u rec   Ulcer    Past Surgical History:  Procedure Laterality Date   BUNIONECTOMY WITH WEIL OSTEOTOMY Right 11/02/2019   Procedure: Right Foot Lapidus, Modified McBride Bunionectomy,  Hallux Akin Osteotomy;  Surgeon: Toni Arthurs, MD;  Location: [facility];  Service: Orthopedics;   Laterality: Right;   CARDIAC CATHETERIZATION  2005   30% Cx. Dr. Elsie Lincoln   cataract surgery Left 11/05/2014   COLONOSCOPY     COLONOSCOPY N/A 01/09/2021   Procedure: COLONOSCOPY;  Surgeon: Kerin Salen, MD;  Location: [facility];  Service: Gastroenterology;  Laterality: N/A;   ESOPHAGOGASTRODUODENOSCOPY N/A 05/02/2015   Procedure: ESOPHAGOGASTRODUODENOSCOPY (EGD);  Surgeon: Bernette Redbird, MD;  Location: [facility];  Service: Endoscopy;  Laterality: N/A;   ESOPHAGOGASTRODUODENOSCOPY  05/02/2015   no source of pt chest pain endoscopically evident. small hiatal hernia.   ESOPHAGOGASTRODUODENOSCOPY (EGD) WITH PROPOFOL N/A 01/02/2021   Procedure: ESOPHAGOGASTRODUODENOSCOPY (EGD) WITH PROPOFOL;  Surgeon: Vida Rigger, MD;  Location: [facility];  Service: Endoscopy;  Laterality: N/A;   EYE SURGERY     HARDWARE REMOVAL Right 11/02/2019   Procedure: Second Metatarsal Removal of Deep Implant and Rotational Osteotomy;  Surgeon: Toni Arthurs, MD;  Location: [facility];  Service: Orthopedics;  Laterality: Right;   IR ANGIOGRAM SELECTIVE EACH ADDITIONAL VESSEL  01/04/2021   IR ANGIOGRAM SELECTIVE EACH ADDITIONAL VESSEL  01/04/2021   IR ANGIOGRAM VISCERAL SELECTIVE  01/04/2021   IR US GUIDE VASC ACCESS RIGHT  01/04/2021   LUMBAR LAMINECTOMY/DECOMPRESSION MICRODISCECTOMY N/A 07/03/2021   Procedure: Lumbar three through five decompression with lumbar three through five  insitu fusion;  Surgeon: Venita Lick, MD;  Location: [facility] OR;  Service: Orthopedics;  Laterality: N/A;   MEMBRANE PEEL Left 03/14/2014   Procedure: MEMBRANE PEEL; ENDOLASER;  Surgeon: Edmon Crape, MD;  Location: [facility] OR;  Service: Ophthalmology;  Laterality: Left;   NM MYOVIEW LTD  10/2015   LOW RISK. Small, fixed basal lateral defect - likely diaphragmatic attenuation. 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  No R WMA.  GRII DD-moderately dilated LA..  Mildly dilated RV but normal function.  Normal RAP/CVP.  Trivial AI with mild to moderate sclerosis-no stenosis   UMBILICAL HERNIA REPAIR  2023   UPPER GASTROINTESTINAL ENDOSCOPY     WEIL OSTEOTOMY Right 09/01/2017   Procedure: RIGHT GREAT TOE CHEVRON AND WEIL OSTEOTOMY 2ND METATARSAL;  Surgeon: Nadara Mustard, MD;  Location: [facility] OR;  Service: Orthopedics;  Laterality: Right;   XI ROBOTIC ASSISTED VENTRAL HERNIA N/A 03/17/2022   Procedure: XI ROBOTIC ASSISTED VENTRAL HERNIA;  Surgeon: Henrene Dodge, MD;  Location: [facility];  Service: General;  Laterality: N/A;   Patient Active Problem List   Diagnosis Date Noted   Ventral hernia without obstruction or gangrene    Primary open angle glaucoma of left eye, mild stage 06/23/2021   Facial numbness 12/24/2020   Neovascular glaucoma, right eye, stage unspecified 12/16/2020   Lumbar radiculopathy 11/25/2020   Gastroesophageal reflux disease 09/10/2020   Dry eyes, bilateral 08/15/2020   Presbycusis of right ear 01/17/2020   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   Secondary corneal edema of right eye 12/12/2019   Ptosis of right eyelid 12/12/2019   OAB (overactive bladder) 11/29/2019   Bunion of great toe of right foot 07/14/2017   Claw toe, acquired, right 07/14/2017   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   Medication management 10/09/2016   Chest pain at rest 07/04/2015   Plantar fasciitis of right foot 02/19/2015   Depression 08/14/2014   Family history of heart disease in male family member before age 75 04/26/2014   Mild cognitive impairment 03/13/2014   Parkinsonian tremor (HCC) 02/12/2014   Hyperlipidemia with target LDL less than 100    Abdominal aortic  atherosclerosis (HCC)    Moderate essential hypertension 02/25/2011   Arthropathy 02/25/2011   HAV (hallux abducto valgus) 02/25/2011   ONSET DATE: 09/20/2023  REFERRING DIAG: G20.C (ICD-10-CM) - Parkinsonism, unspecified  THERAPY DIAG:  Other lack of coordination  Muscle weakness (generalized)  Tremor  Rationale for Evaluation and Treatment: Rehabilitation  SUBJECTIVE:   SUBJECTIVE STATEMENT:  Pt accompanied by: self and Spouse  - Britta MccreedyBee") dropped him off  Pt does report that he has changed his social habits due to issues he has had over the past couple of years including finding his words etc.  PERTINENT HISTORY: Tremor (2015), parkinsonism, allergies, arthritis, reflux disease, glaucoma, gout, hepatitis C, hyperlipidemia, hypertension, plantar fasciitis, peripheral vascular disease, and BPH, admitted 07/03/2021 for L3-5 spinal fusion Back, foot, hernia, neuro surgery (pains)  PRECAUTIONS: Fall and Other: Vision loss R eye  WEIGHT BEARING RESTRICTIONS: No  PAIN: Pain in back  Are you having pain? Yes: NPRS scale: 6/10 but declined  after medicine today Pain location: Lower back (every day) Pain description: If I get up - can be sharp pains, sometimes aching  Aggravating factors: Moving, bending, lifting Relieving factors: Ibuprofen, acetaminophen resting  FALLS: Has patient fallen in last 6 months? No - near misses - ie) getting out of the chair  LIVING ENVIRONMENT: Lives with: lives with their spouse Lives in: House/apartment Stairs: Yes: Internal: 14+ steps; on left going up and External: 2-3 steps; can reach both Has following equipment at home: Single point cane, Walker - 2 wheeled, shower chair, Grab bars, and elevated toilet seat, walk in shower with removable seat - not using it at this time  PLOF: Independent with basic ADLs, Independent with community mobility without device, Needs assistance with homemaking, and Not driving  PATIENT GOALS: Pt reported  that he wanted to be able to improve memory/recall, regain/improve balance ie) to minimize getting frozen, keep up/improve strength ie) to open doors for wife, to be able to take a container off the shelf, get the bottles of water from the car etc.  OBJECTIVE:  Note: Objective measures were completed at Evaluation unless otherwise noted.  HAND DOMINANCE: Right  ADLs: Overall ADLs: Occasional support from spouse Transfers/ambulation related to ADLs: Ind/Mod I Eating: Pt can cut some foods himself but may get help Grooming: Pt shaves himself - uses regular razor UB Dressing: Ind - may get help to pull his jacket off LB Dressing: Ind Toileting: Ind - uses regular undergarments, but has used pull-ups at night but is not using them at this time Bathing: Showers a couple of times/week and stands in the shower despite having a shower stool available Tub Shower transfers: Mod I - has grab bars Equipment: Shower seat without back, Grab bars, Walk in shower, and Reacher  IADLs: Not driving due to vision trouble - ie) limited peripheral vision Shopping: Goes with spouse Light housekeeping: Pt reports helping to wash dishes Meal Prep: Helps spouse but is slower getting things done.  He likes to cook breakfast. Community mobility: Ind without AE Medication management: Spouse helps take care of meds Financial management: Spouse does this  Handwriting: 50-75% legible - By pt report "it sucks."  Pt had improved legibility with printing verus writing.  MOBILITY STATUS: Independent and remote h/o falls  POSTURE COMMENTS:  rounded shoulders, forward head, increased thoracic kyphosis, flexed trunk , and weight shift left   ACTIVITY TOLERANCE: Activity tolerance: Good  FUNCTIONAL OUTCOME MEASURES: Fastening/unfastening 3 buttons: 46.24 sec Physical performance test: PPT#2 (simulated eating) 16.96 & PPT#4 (donning/doffing jacket): 28.58 sec  COORDINATION: 9 Hole Peg test: Right: 31.54 sec; Left:  34.65 sec Box and Blocks:  Right 38 blocks, Left 38 blocks  UE ROM:  WFL - slight end range limitations  UE MMT:   WFL - 4-/5 Grip Strength:  Right: 53.1, 56.4, 55.7 - Average 55.1 lbs Left:  42.7, 51.3, 48.2 - Average 47.4 lbs  SENSATION: Pt reports his fingertips get cold and "turn blue", (not thumbs)   MUSCLE TONE: WFL  COGNITION: Overall cognitive status:  slower processing by report ie) gets frozen and asks for frequent feedback from spouse for memory  PD OBSERVATIONS: Dyskinesias, Postural tremors, and pt and spouse report some freezing   VISION: Pt has history of glaucoma and some eye injections with loss of peripheral vision, R eye limitations and confirms that he has used magnify glass.  Pt has Iphone.  TREATMENT DATE: 10/05/23   Therapeutic Activities  Coordination Exercise/Activity handout with images provided for various activities to work on B UE finger ROM, dexterity and isolated movements with demonstration and practice, as well as modification, hand over hand guidance and cues throughout to improve technique, digital isolation and ease of performing task.  Tasks included:  Pick up coins, dominoes, buttons, marbles, dried beans/pasta of different sizes ... To place in containers To stack - with guidance to work on include/isolate specific fingers. To pick up items one at a time until patient got 5+ in their hand and then move item from palm to fingertips to release ie) Finger-to-palm then palm-to-finger translation of small items - Options to vary difficulty include using a washcloth under items like coins or using larger items (checkers vs coins or blocks/dominos vs dice) for increased ease of picking up items.  Shuffling, Flipping and dealing cards 1 at a time. -- Setup patient to work on sorting cards, focusing on using index finger with thumb to flip  cards or holding deck of cards in palm of hand and push off 1 card at a time from the top of the deck using only thumb   Patient is encouraged to take breaks, relax arm/shoulder by supporting forearm, minimize compensatory motions and a try different activities throughout the day/week including games like Dorisann Frames (for the dice), card games, Connect 4 etc.   Patient benefited from extra time, verbal/tactile cues, and modeling of task to allow time for processing of verbal instructions and improve motor planning of unfamiliar movements.     PATIENT EDUCATION: Education details: OT role, POC consideration and tx progress (see above) Person educated: Patient and Spouse Education method: Explanation and Verbal cues Education comprehension: verbalized understanding, verbal cues required, and needs further education  HOME EXERCISE PROGRAM: TBD  GOALS: Goals reviewed with patient? Yes GOALS:  SHORT TERM GOALS: Target date: 10/22/23  Pt will be independent with PD specific HEP. Baseline: not yet initiated Goal status: IN Progress  2.  Pt will independently recall and demonstrate at least 3 compensatory strategies/adaptations for visual impairment/memory aide without cueing including technology options with IPhone  Baseline: New to outpt OT - h/o glaucoma, has used magnify glass Goal status: INITIAL  3.  Pt will improve confidence with using his own strength ie) to open doors for wife, to be able to take a container off the shelf, and carry items from the car  Baseline: UE strength: 4-/5; Grip: Right: 55.1 lbs Left: 47.4 lbs Goal status: INITIAL  4.  Pt will write a grocery list x 10 words with no significant decrease in size and maintain 75% legibility. Baseline: 50-75% legibility Goal status: INITIAL  5.  Pt will demonstrate fine motor coordination for ADLs as evidenced by no decline and/or improvement in 9 hole peg test score and Box and Blocks Test for BUE. Baseline: 9 Hole: Right:  31.54 sec; Left: 34.65 sec  Box and Blocks: Right 38 blocks, Left 38 blocks Goal status: IN Progress   LONG TERM GOALS: Target date: 12/21/2023    Pt will verbalize understanding of ways to prevent future PD related complications and PD community resources. Baseline: not yet initiated Goal status: INITIAL  2.  Pt will verbalize understanding of ways to keep thinking skills sharp/WARM strategies and ways to compensate for STM changes now and in the future, including use of Memory Notebook. Baseline: not yet initiated Goal status: INITIAL  3.  Pt will verbalize understanding of adapted strategies to  maximize safety, independence and timeliness with ADLs/IADLs. Baseline: Physical performance test: PPT#2 (simulated eating) 16.96 & PPT#4 (donning/doffing jacket): 28.58 sec Goal status: INITIAL  4.  Pt will demonstrate improved FM skills for max ease with fastening buttons as evidenced by decreasing 3 button/unbutton time by 5+ seconds Baseline: 46.24 secs Goal status: INITIAL    ASSESSMENT:  CLINICAL IMPRESSION: Patient is a 73 y.o. male who was seen today for occupational therapy treatment for Parkinson's. Hx includes lumbar radiculopathy, spinal stenosis, tremors and glaucoma. Patient reponded well to various functional HEP ideas for coordination and will benefit from currently presents with gradual decline in baseline level of function with ADLS demonstrating functional deficits and impairments in strength, coordination, speed and safety with tasks. Pt would benefit from skilled OT services in the outpatient setting to work on impairments as noted below to help pt return to PLOF as able.    PERFORMANCE DEFICITS: in functional skills including ADLs, IADLs, coordination, dexterity, proprioception, sensation, tone, ROM, strength, pain, flexibility, Fine motor control, Gross motor control, mobility, balance, body mechanics, endurance, continence, decreased knowledge of precautions, decreased  knowledge of use of DME, vision, and UE functional use, cognitive skills including attention, energy/drive, learn, memory, problem solving, safety awareness, thought, and understand, and psychosocial skills including coping strategies, environmental adaptation, and routines and behaviors.   IMPAIRMENTS: are limiting patient from ADLs, IADLs, and leisure.   COMORBIDITIES:  has co-morbidities such as glaucoma, history of back and foot surgery, HTN  that affects occupational performance. Patient will benefit from skilled OT to address above impairments and improve overall function.  REHAB POTENTIAL: Good   PLAN:  OT FREQUENCY: 2x/week  OT DURATION: 6 weeks  PLANNED INTERVENTIONS: 97168 OT Re-evaluation, 97535 self care/ADL training, 40981 therapeutic exercise, 97530 therapeutic activity, 97112 neuromuscular re-education, [phone] aquatic therapy, balance training, functional mobility training, visual/perceptual remediation/compensation, psychosocial skills training, energy conservation, coping strategies training, patient/family education, and DME and/or AE instructions  RECOMMENDED OTHER SERVICES:   CONSULTED AND AGREED WITH PLAN OF CARE: Patient and family member/caregiver  PLAN FOR NEXT SESSION:  Pwr! Moves Check re: ST referral Visual comp strategies  Begin/progress HEP training - strength, coordination and vision WARM memory strategies Memory Note Book  ADL comp strategies   Victorino Sparrow, OT 10/05/2023, 5:56 PM

## 2023-10-05 NOTE — Therapy (Signed)
 OUTPATIENT PHYSICAL THERAPY NEURO TREATMENT   Patient Name: [name] MRN: [id number] DOB:[birth year], 73 y.o., male Today's Date: 10/05/2023   PCP: Ronnald Nian, MD REFERRING PROVIDER: Ihor Austin, NP  END OF SESSION:  PT End of Session - 10/05/23 1446     Visit Number 2    Number of Visits 13    Date for PT Re-Evaluation 11/16/23    Authorization Type [employer] Medicare    PT Start Time 1446    PT Stop Time 1530    PT Time Calculation (min) 44 min    Equipment Utilized During Treatment Gait belt    Activity Tolerance Patient tolerated treatment well    Behavior During Therapy WFL for tasks assessed/performed              Past Medical History:  Diagnosis Date   Allergy    seasonal   Arthritis    BPH (benign prostatic hyperplasia)    Complication of anesthesia    Pt. stated he had a reaction that ended in him requiring urinary cath placement; hx delirium worsening parkinson symptoms   Diverticulosis    GERD (gastroesophageal reflux disease)    esophageal spasms   Glaucoma    Gout    Head injury, closed, with concussion    Hepatitis C    chronic - Has been treated with Harvoni   HLD (hyperlipidemia)    statin intolerant (Crestor & Simvastatin) - Taking Livalo 1mg  / week   Hypertension    Parkinson's disease (HCC)    Plantar fasciitis    right   PVD (peripheral vascular disease) (HCC)    With no claudication; only mild abdominal aortic atherosclerosis noted on ultrasound.   Spinal stenosis of lumbar region    Thoracic ascending aortic aneurysm (HCC)    4.2 cm ascending TAA 09/2016 CT, 1 yr f/u rec   Ulcer    Past Surgical History:  Procedure Laterality Date   BUNIONECTOMY WITH WEIL OSTEOTOMY Right 11/02/2019   Procedure: Right Foot Lapidus, Modified McBride Bunionectomy,  Hallux Akin Osteotomy;  Surgeon: Toni Arthurs, MD;  Location: [facility];  Service: Orthopedics;  Laterality: Right;   CARDIAC CATHETERIZATION  2005   30% Cx. Dr.  Elsie Lincoln   cataract surgery Left 11/05/2014   COLONOSCOPY     COLONOSCOPY N/A 01/09/2021   Procedure: COLONOSCOPY;  Surgeon: Kerin Salen, MD;  Location: [facility];  Service: Gastroenterology;  Laterality: N/A;   ESOPHAGOGASTRODUODENOSCOPY N/A 05/02/2015   Procedure: ESOPHAGOGASTRODUODENOSCOPY (EGD);  Surgeon: Bernette Redbird, MD;  Location: [facility];  Service: Endoscopy;  Laterality: N/A;   ESOPHAGOGASTRODUODENOSCOPY  05/02/2015   no source of pt chest pain endoscopically evident. small hiatal hernia.   ESOPHAGOGASTRODUODENOSCOPY (EGD) WITH PROPOFOL N/A 01/02/2021   Procedure: ESOPHAGOGASTRODUODENOSCOPY (EGD) WITH PROPOFOL;  Surgeon: Vida Rigger, MD;  Location: [facility];  Service: Endoscopy;  Laterality: N/A;   EYE SURGERY     HARDWARE REMOVAL Right 11/02/2019   Procedure: Second Metatarsal Removal of Deep Implant and Rotational Osteotomy;  Surgeon: Toni Arthurs, MD;  Location: [facility];  Service: Orthopedics;  Laterality: Right;   IR ANGIOGRAM SELECTIVE EACH ADDITIONAL VESSEL  01/04/2021   IR ANGIOGRAM SELECTIVE EACH ADDITIONAL VESSEL  01/04/2021   IR ANGIOGRAM VISCERAL SELECTIVE  01/04/2021   IR US GUIDE VASC ACCESS RIGHT  01/04/2021   LUMBAR LAMINECTOMY/DECOMPRESSION MICRODISCECTOMY N/A 07/03/2021   Procedure: Lumbar three through five decompression with lumbar three through five insitu fusion;  Surgeon: Venita Lick, MD;  Location: [facility]  OR;  Service: Orthopedics;  Laterality: N/A;   MEMBRANE PEEL Left 03/14/2014   Procedure: MEMBRANE PEEL; ENDOLASER;  Surgeon: Edmon Crape, MD;  Location: [facility];  Service: Ophthalmology;  Laterality: Left;   NM MYOVIEW LTD  10/2015   LOW RISK. Small, fixed basal lateral defect - likely diaphragmatic attenuation. 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  No R WMA.  GRII DD-moderately dilated LA..  Mildly dilated RV but normal function.  Normal RAP/CVP.  Trivial AI with mild to moderate sclerosis-no stenosis   UMBILICAL HERNIA REPAIR  2023   UPPER GASTROINTESTINAL ENDOSCOPY     WEIL OSTEOTOMY Right 09/01/2017   Procedure: RIGHT GREAT TOE CHEVRON AND WEIL OSTEOTOMY 2ND METATARSAL;  Surgeon: Nadara Mustard, MD;  Location: [facility] OR;  Service: Orthopedics;  Laterality: Right;   XI ROBOTIC ASSISTED VENTRAL HERNIA N/A 03/17/2022   Procedure: XI ROBOTIC ASSISTED VENTRAL HERNIA;  Surgeon: Henrene Dodge, MD;  Location: [facility];  Service: General;  Laterality: N/A;   Patient Active Problem List   Diagnosis Date Noted   Ventral hernia without obstruction or gangrene    Primary open angle glaucoma of left eye, mild stage 06/23/2021   Facial numbness 12/24/2020   Neovascular glaucoma, right eye, stage unspecified 12/16/2020   Lumbar radiculopathy 11/25/2020   Gastroesophageal reflux disease 09/10/2020   Dry eyes, bilateral 08/15/2020   Presbycusis of right ear 01/17/2020   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   Secondary corneal edema of right eye 12/12/2019   Ptosis of right eyelid 12/12/2019   OAB (overactive bladder) 11/29/2019   Bunion of great toe of right foot 07/14/2017   Claw toe, acquired, right 07/14/2017   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   Medication management 10/09/2016   Chest pain at rest 07/04/2015   Plantar fasciitis of right foot 02/19/2015   Depression 08/14/2014   Family history of heart disease in male family member before age 66 04/26/2014   Mild cognitive impairment 03/13/2014   Parkinsonian tremor (HCC) 02/12/2014   Hyperlipidemia with target LDL less than 100    Abdominal aortic atherosclerosis (HCC)    Moderate essential hypertension  02/25/2011   Arthropathy 02/25/2011   HAV (hallux abducto valgus) 02/25/2011    ONSET DATE: 09/08/2023 (referral)   REFERRING DIAG: G20.C (ICD-10-CM) - Primary parkinsonism (HCC)  THERAPY DIAG:  Unsteadiness on feet  Other abnormalities of gait and mobility  Muscle weakness (generalized)  Rationale for Evaluation and Treatment: Rehabilitation  SUBJECTIVE:  SUBJECTIVE STATEMENT: Pt presents without AD. Denies acute changes or falls. Had some back pain this morning but is better now.    Pt accompanied by:  Wife, Britta Mccreedy (dropped pt off)   PERTINENT HISTORY:  tremor (2014), parkinsonism, allergies, arthritis, reflux disease, glaucoma, gout, hepatitis C, hyperlipidemia, hypertension, plantar fasciitis, peripheral vascular disease, and BPH, admitted 12/8 for L3-5 spinal fusion   PAIN:  Are you having pain? No  PRECAUTIONS: Fall and Other: blind in R eye  RED FLAGS: None   WEIGHT BEARING RESTRICTIONS: No  FALLS: Has patient fallen in last 6 months? No and reports frequent near misses   LIVING ENVIRONMENT: Lives with: lives with their spouse Lives in: House/apartment Stairs: Yes: Internal: 2 flights steps; on left going up and External: 1 in front and 3 in side steps; none Has following equipment at home: Single point cane, Walker - 2 wheeled, shower chair, and Grab bars  PLOF: Needs assistance with ADLs and Needs assistance with homemaking  PATIENT GOALS: "Just wanting to have as much control as I can"   OBJECTIVE:  Note: Objective measures were completed at Evaluation unless otherwise noted.  DIAGNOSTIC FINDINGS: CT of R foot from 2021    IMPRESSION: 1. Interval surgical arthrodesis at the 1st tarsometatarsal articulation. There is no solid osseous ankylosis across the joint, and  there is intraosseous cyst formation dorsally. 2. Interval placement of a cortical screw in the medial base of the 1st proximal phalanx without hardware loosening or associated osseous abnormality. 3. Stable deformity of the 1st metatarsal head and neck consistent with previous osteotomy. Stable chronic Freiberg infraction and postsurgical changes in the 2nd metatarsal head. 4. Stable additional mild degenerative changes. No acute findings or significant joint effusions.  COGNITION: Overall cognitive status: Impaired   SENSATION: Pt reports occasional numbness/tingling in BLEs    MUSCLE TONE: LLE: Rigidity   POSTURE: rounded shoulders, forward head, increased thoracic kyphosis, flexed trunk , and weight shift left  LOWER EXTREMITY ROM:     Active  Right Eval Left Eval  Hip flexion    Hip extension    Hip abduction    Hip adduction    Hip internal rotation    Hip external rotation    Knee flexion    Knee extension    Ankle dorsiflexion    Ankle plantarflexion    Ankle inversion    Ankle eversion     (Blank rows = not tested)  LOWER EXTREMITY MMT:    MMT Right Eval Left Eval  Hip flexion    Hip extension    Hip abduction    Hip adduction    Hip internal rotation    Hip external rotation    Knee flexion    Knee extension    Ankle dorsiflexion    Ankle plantarflexion    Ankle inversion    Ankle eversion    (Blank rows = not tested)  BED MOBILITY:  Pt reports independence w/this but it is difficult due to elevated height of the bed and foot pain   TRANSFERS: Assistive device utilized: None  Sit to stand: SBA Stand to sit: SBA Chair to chair: SBA Pt demonstrates laterally instability to R side, wide BOS, decreased eccentric control   GAIT: Gait pattern: step through pattern, decreased arm swing- Left, decreased stride length, lateral hip instability, decreased trunk rotation, trunk flexed, and wide BOS Distance walked: Various clinic distances   Assistive device utilized: None Level of assistance: SBA and CGA Comments: Pt maintains L  hand in fist. Wide BOS w/decreased lateral weight shift. Pt lost balance posteriorly w/turn, requiring CGA for safety   VITALS  Vitals:   10/05/23 1449  BP: 111/75  Pulse: (!) 55                                                                                                                                 TREATMENT:  Self-care/home management  Assessed BP (see above) and WNL    Physical Performance MCTSIB: Condition 1: Avg of 3 trials: 30 sec, Condition 2: Avg of 3 trials: 30 sec (mod A/P sway), Condition 3: Avg of 3 trials: 30  sec, Condition 4: Avg of 3 trials: 30 sec (mod anterolateral sway to R), and Total Score: 120/120.    [facility] PT Assessment - 10/05/23 1456       Balance   Balance Assessed Yes      Standardized Balance Assessment   Standardized Balance Assessment Mini-BESTest;Timed Up and Go Test      Mini-BESTest   Sit To Stand Normal: Comes to stand without use of hands and stabilizes independently.    Rise to Toes Moderate: Heels up, but not full range (smaller than when holding hands), OR noticeable instability for 3 s.    Stand on one leg (left) Moderate: < 20 s   8.97s   Stand on one leg (right) Moderate: < 20 s   ~1s   Stand on one leg - lowest score 1    Compensatory Stepping Correction - Forward Normal: Recovers independently with a single, large step (second realignement is allowed).    Compensatory Stepping Correction - Backward Moderate: More than one step is required to recover equilibrium   several small steps   Compensatory Stepping Correction - Left Lateral Moderate: Several steps to recover equilibrium   large crossover step followed by several small steps   Compensatory Stepping Correction - Right Lateral Moderate: Several steps to recover equilibrium   Crossover step   Stepping Corredtion Lateral - lowest score 1    Stance - Feet together, eyes open, firm  surface  Normal: 30s    Stance - Feet together, eyes closed, foam surface  Moderate: < 30s   11.81s   Incline - Eyes Closed Normal: Stands independently 30s and aligns with gravity    Change in Gait Speed Moderate: Unable to change walking speed or signs of imbalance    Walk with head turns - Horizontal Normal: performs head turns with no change in gait speed and good balance    Walk with pivot turns Moderate:Turns with feet close SLOW (>4 steps) with good balance.    Step over obstacles Moderate: Steps over box but touches box OR displays cautious behavior by slowing gait.    Timed UP & GO with Dual Task Moderate: Dual Task affects either counting OR walking (>10%) when compared to the TUG without Dual Task.    Mini-BEST total score 19  Timed Up and Go Test   Normal TUG (seconds) 11.44    Cognitive TUG (seconds) 22.47   retro counting by 3s           Ther Act  Established initial HEP (see bolded below) for improved truncal mobility, lateral weight shifting and step clearance:  Sit to stands w/o UE support x10 reps. Pt reported discomfort in low back w/movement and no instability noted, so did not add to HEP this date Seated windmill stretch, x7 reps per side. Min cues to maintain gaze on extended arm throughout. No discomfort in low back reported  At counter, alt side step w/contralateral OH reach to target, x8 reps per side. Min cues to maintain LARGE step w/RLE throughout.    PATIENT EDUCATION: Education details: Results of OM, initial HEP  Person educated: Patient Education method: Explanation, Demonstration, Verbal cues, and Handouts Education comprehension: verbalized understanding, returned demonstration, verbal cues required, and needs further education  HOME EXERCISE PROGRAM: Access Code: [email or web address] URL: [email or web address] Date: 10/05/2023 Prepared by: Alethia Berthold Kaycee Haycraft  Exercises - Seated Windmill Trunk Rotation Stretch  - 1 x daily - 7 x weekly - 3  sets - 10 reps - Standing Reach to Opposite Side with Weight Shift  - 1 x daily - 7 x weekly - 3 sets - 10 reps  GOALS: Goals reviewed with patient? Yes  SHORT TERM GOALS: Target date: 10/26/2023   Pt will be independent with initial HEP for improved strength, balance, transfers and gait.  Baseline: not established on eval  Goal status: INITIAL  2.  MiniBest to be assessed and LTG updated  Baseline:  Goal status: INITIAL  3.  Pt and wife will verbalize and demonstrate freezing strategies to implement at home and in community for reduced fall risk  Baseline:  Goal status: INITIAL  4.  MCTSIB to be assessed and LTG updated Baseline:  Goal status: MET    LONG TERM GOALS: Target date: 11/09/2023   Pt will verbalize understanding of local PD community resources, including fitness post DC.   Baseline:  Goal status: INITIAL  2. Pt will improve MiniBest to 23/28 for decreased fall risk and improvement with compensatory stepping strategies.   Baseline: 19/28 Goal status: REVISED   3.  Pt will improve 5 x STS to less than or equal to 15 seconds w/o UE support and proper body mechanics to demonstrate improved functional strength and transfer efficiency.   Baseline: 19.55s no UE support, weight shift to R Goal status: INITIAL  4.  Pt will perform floor transfer w/SBA for improved fall recovery and safety  Baseline:  Goal status: INITIAL  5.  MCTSIB goal Baseline: 120/120 Goal status: DC due to high baseline score    ASSESSMENT:  CLINICAL IMPRESSION: Emphasis of skilled PT session on balance assessment via MCTSIB and Minibest as well as establishing initial HEP. Pt achieved a perfect score on MCTSIB, so discontinued goal but noted anterolateral lean preference to R side. Pt scored a 19/28 on MiniBest and had most difficulty w/narrow BOS and reactive balance strategies. Established initial HEP to address low back pain and decreased weight shift to L side, which pt tolerated  well. Continue POC.    OBJECTIVE IMPAIRMENTS: Abnormal gait, decreased activity tolerance, decreased balance, decreased cognition, decreased coordination, decreased knowledge of condition, decreased knowledge of use of DME, decreased mobility, difficulty walking, decreased strength, decreased safety awareness, impaired sensation, impaired UE functional use, impaired vision/preception, improper body mechanics, and pain.   ACTIVITY LIMITATIONS: carrying, lifting,  bending, standing, squatting, stairs, transfers, bed mobility, bathing, dressing, hygiene/grooming, and locomotion level  PARTICIPATION LIMITATIONS: meal prep, cleaning, laundry, medication management, driving, shopping, community activity, and yard work  PERSONAL FACTORS: Age, Fitness, Past/current experiences, and 1-2 comorbidities: PD, blindness in R eye  are also affecting patient's functional outcome.   REHAB POTENTIAL: Fair due to impaired vision, PD  CLINICAL DECISION MAKING: Evolving/moderate complexity  EVALUATION COMPLEXITY: Moderate  PLAN:  PT FREQUENCY: 1-2x/week  PT DURATION: 6 weeks  PLANNED INTERVENTIONS: 97164- PT Re-evaluation, 97110-Therapeutic exercises, 97530- Therapeutic activity, 97112- Neuromuscular re-education, 97535- Self Care, 16109- Manual therapy, [phone]- Gait training, [phone]- Orthotic Fit/training, [phone]- Canalith repositioning, [id number]- Aquatic Therapy, [phone]- Electrical stimulation (manual), Patient/Family education, Balance training, Stair training, Dry Needling, Joint mobilization, Vestibular training, and DME instructions  PLAN FOR NEXT SESSION:  Add to HEP to work on turns, truncal mobility and compensation strategies for impaired peripheral vision, lateral weight shift to L   Sie Formisano E Shaquan Missey, PT, DPT 10/05/2023, 3:35 PM

## 2023-10-07 ENCOUNTER — Other Ambulatory Visit (HOSPITAL_COMMUNITY): Payer: Self-pay

## 2023-10-07 ENCOUNTER — Ambulatory Visit: Admitting: Physical Therapy

## 2023-10-07 DIAGNOSIS — H401123 Primary open-angle glaucoma, left eye, severe stage: Secondary | ICD-10-CM | POA: Diagnosis not present

## 2023-10-07 DIAGNOSIS — H538 Other visual disturbances: Secondary | ICD-10-CM | POA: Diagnosis not present

## 2023-10-07 DIAGNOSIS — H4089 Other specified glaucoma: Secondary | ICD-10-CM | POA: Diagnosis not present

## 2023-10-07 DIAGNOSIS — H04123 Dry eye syndrome of bilateral lacrimal glands: Secondary | ICD-10-CM | POA: Diagnosis not present

## 2023-10-07 DIAGNOSIS — H18231 Secondary corneal edema, right eye: Secondary | ICD-10-CM | POA: Diagnosis not present

## 2023-10-07 DIAGNOSIS — H34832 Tributary (branch) retinal vein occlusion, left eye, with macular edema: Secondary | ICD-10-CM | POA: Diagnosis not present

## 2023-10-08 ENCOUNTER — Other Ambulatory Visit: Payer: Self-pay

## 2023-10-08 ENCOUNTER — Other Ambulatory Visit (HOSPITAL_COMMUNITY): Payer: Self-pay

## 2023-10-11 ENCOUNTER — Other Ambulatory Visit (HOSPITAL_COMMUNITY): Payer: Self-pay

## 2023-10-12 ENCOUNTER — Encounter: Payer: Self-pay | Admitting: Physical Therapy

## 2023-10-12 ENCOUNTER — Ambulatory Visit: Admitting: Physical Therapy

## 2023-10-12 ENCOUNTER — Ambulatory Visit: Admitting: Occupational Therapy

## 2023-10-12 ENCOUNTER — Other Ambulatory Visit (HOSPITAL_COMMUNITY): Payer: Self-pay

## 2023-10-12 VITALS — BP 126/83 | HR 54

## 2023-10-12 DIAGNOSIS — R2689 Other abnormalities of gait and mobility: Secondary | ICD-10-CM | POA: Diagnosis not present

## 2023-10-12 DIAGNOSIS — R251 Tremor, unspecified: Secondary | ICD-10-CM

## 2023-10-12 DIAGNOSIS — R29818 Other symptoms and signs involving the nervous system: Secondary | ICD-10-CM

## 2023-10-12 DIAGNOSIS — R278 Other lack of coordination: Secondary | ICD-10-CM | POA: Diagnosis not present

## 2023-10-12 DIAGNOSIS — M6281 Muscle weakness (generalized): Secondary | ICD-10-CM

## 2023-10-12 DIAGNOSIS — R41842 Visuospatial deficit: Secondary | ICD-10-CM

## 2023-10-12 DIAGNOSIS — R2681 Unsteadiness on feet: Secondary | ICD-10-CM

## 2023-10-12 NOTE — Therapy (Unsigned)
 OUTPATIENT OCCUPATIONAL THERAPY [facility]S [facility]  Patient Name: [name] MRN: [id number] DOB:07/06/1951, 73 y.o., male Today's Date: 10/12/2023  PCP: Ronnald Nian, MD REFERRING PROVIDER: Ihor Austin, NP  END OF SESSION:  OT End of Session - 10/12/23 1223     Visit Number 3    Number of Visits 13   including eval/tx   Date for OT Re-Evaluation 11/24/23    Authorization Type [employer] 2025    Authorization Time Period VL:MN Auth Required    Progress Note Due on Visit 10    OT Start Time 1235    OT Stop Time 1316    OT Time Calculation (min) 41 min    Activity Tolerance Patient tolerated treatment well    Behavior During Therapy WFL for tasks assessed/performed             Past Medical History:  Diagnosis Date   Allergy    seasonal   Arthritis    BPH (benign prostatic hyperplasia)    Complication of anesthesia    Pt. stated he had a reaction that ended in him requiring urinary cath placement; hx delirium worsening parkinson symptoms   Diverticulosis    GERD (gastroesophageal reflux disease)    esophageal spasms   Glaucoma    Gout    Head injury, closed, with concussion    Hepatitis C    chronic - Has been treated with Harvoni   HLD (hyperlipidemia)    statin intolerant (Crestor & Simvastatin) - Taking Livalo 1mg  / week   Hypertension    Parkinson's disease (HCC)    Plantar fasciitis    right   PVD (peripheral vascular disease) (HCC)    With no claudication; only mild abdominal aortic atherosclerosis noted on ultrasound.   Spinal stenosis of lumbar region    Thoracic ascending aortic aneurysm (HCC)    4.2 cm ascending TAA 09/2016 CT, 1 yr f/u rec   Ulcer    Past Surgical History:  Procedure Laterality Date   BUNIONECTOMY WITH WEIL OSTEOTOMY Right 11/02/2019   Procedure: Right Foot Lapidus, Modified McBride Bunionectomy,  Hallux Akin Osteotomy;  Surgeon: Toni Arthurs, MD;  Location: [facility];  Service: Orthopedics;   Laterality: Right;   CARDIAC CATHETERIZATION  2005   30% Cx. Dr. Elsie Lincoln   cataract surgery Left 11/05/2014   COLONOSCOPY     COLONOSCOPY N/A 01/09/2021   Procedure: COLONOSCOPY;  Surgeon: Kerin Salen, MD;  Location: [facility];  Service: Gastroenterology;  Laterality: N/A;   ESOPHAGOGASTRODUODENOSCOPY N/A 05/02/2015   Procedure: ESOPHAGOGASTRODUODENOSCOPY (EGD);  Surgeon: Bernette Redbird, MD;  Location: [facility];  Service: Endoscopy;  Laterality: N/A;   ESOPHAGOGASTRODUODENOSCOPY  05/02/2015   no source of pt chest pain endoscopically evident. small hiatal hernia.   ESOPHAGOGASTRODUODENOSCOPY (EGD) WITH PROPOFOL N/A 01/02/2021   Procedure: ESOPHAGOGASTRODUODENOSCOPY (EGD) WITH PROPOFOL;  Surgeon: Vida Rigger, MD;  Location: [facility];  Service: Endoscopy;  Laterality: N/A;   EYE SURGERY     HARDWARE REMOVAL Right 11/02/2019   Procedure: Second Metatarsal Removal of Deep Implant and Rotational Osteotomy;  Surgeon: Toni Arthurs, MD;  Location: [facility];  Service: Orthopedics;  Laterality: Right;   IR ANGIOGRAM SELECTIVE EACH ADDITIONAL VESSEL  01/04/2021   IR ANGIOGRAM SELECTIVE EACH ADDITIONAL VESSEL  01/04/2021   IR ANGIOGRAM VISCERAL SELECTIVE  01/04/2021   IR US GUIDE VASC ACCESS RIGHT  01/04/2021   LUMBAR LAMINECTOMY/DECOMPRESSION MICRODISCECTOMY N/A 07/03/2021   Procedure: Lumbar three through five decompression with lumbar three through five  insitu fusion;  Surgeon: Venita Lick, MD;  Location: [facility] OR;  Service: Orthopedics;  Laterality: N/A;   MEMBRANE PEEL Left 03/14/2014   Procedure: MEMBRANE PEEL; ENDOLASER;  Surgeon: Edmon Crape, MD;  Location: [facility] OR;  Service: Ophthalmology;  Laterality: Left;   NM MYOVIEW LTD  10/2015   LOW RISK. Small, fixed basal lateral defect - likely diaphragmatic attenuation. 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  No R WMA.  GRII DD-moderately dilated LA..  Mildly dilated RV but normal function.  Normal RAP/CVP.  Trivial AI with mild to moderate sclerosis-no stenosis   UMBILICAL HERNIA REPAIR  2023   UPPER GASTROINTESTINAL ENDOSCOPY     WEIL OSTEOTOMY Right 09/01/2017   Procedure: RIGHT GREAT TOE CHEVRON AND WEIL OSTEOTOMY 2ND METATARSAL;  Surgeon: Nadara Mustard, MD;  Location: [facility] OR;  Service: Orthopedics;  Laterality: Right;   XI ROBOTIC ASSISTED VENTRAL HERNIA N/A 03/17/2022   Procedure: XI ROBOTIC ASSISTED VENTRAL HERNIA;  Surgeon: Henrene Dodge, MD;  Location: [facility];  Service: General;  Laterality: N/A;   Patient Active Problem List   Diagnosis Date Noted   Ventral hernia without obstruction or gangrene    Primary open angle glaucoma of left eye, mild stage 06/23/2021   Facial numbness 12/24/2020   Neovascular glaucoma, right eye, stage unspecified 12/16/2020   Lumbar radiculopathy 11/25/2020   Gastroesophageal reflux disease 09/10/2020   Dry eyes, bilateral 08/15/2020   Presbycusis of right ear 01/17/2020   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   Secondary corneal edema of right eye 12/12/2019   Ptosis of right eyelid 12/12/2019   OAB (overactive bladder) 11/29/2019   Bunion of great toe of right foot 07/14/2017   Claw toe, acquired, right 07/14/2017   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   Medication management 10/09/2016   Chest pain at rest 07/04/2015   Plantar fasciitis of right foot 02/19/2015   Depression 08/14/2014   Family history of heart disease in male family member before age 49 04/26/2014   Mild cognitive impairment 03/13/2014   Parkinsonian tremor (HCC) 02/12/2014   Hyperlipidemia with target LDL less than 100    Abdominal aortic  atherosclerosis (HCC)    Moderate essential hypertension 02/25/2011   Arthropathy 02/25/2011   HAV (hallux abducto valgus) 02/25/2011   ONSET DATE: 09/20/2023  REFERRING DIAG: G20.C (ICD-10-CM) - Parkinsonism, unspecified  THERAPY DIAG:  Other lack of coordination  Muscle weakness (generalized)  Tremor  Visuospatial deficit  Other symptoms and signs involving the nervous system  Rationale for Evaluation and Treatment: Rehabilitation  SUBJECTIVE:   SUBJECTIVE STATEMENT:  Pt accompanied by: self - Ralene Muskrat") dropped him off  Pt reports he doesn't use his phone much except to make calls. He is open to using it as a magnifier. He also uses Siri to get the [employer] score.   PERTINENT HISTORY: Tremor (2015), parkinsonism, allergies, arthritis, reflux disease, glaucoma, gout, hepatitis C, hyperlipidemia, hypertension, plantar fasciitis, peripheral vascular disease, and BPH, admitted 07/03/2021 for L3-5 spinal fusion Back, foot, hernia, neuro surgery (pains)  PRECAUTIONS: Fall and Other: Vision loss R eye  WEIGHT BEARING RESTRICTIONS: No  PAIN: Pain  in back  Are you having pain? Yes: NPRS scale: 2/10 Pain location: Lower back (every day) Pain description: If I get up - can be sharp pains, sometimes aching  Aggravating factors: Moving, bending, lifting Relieving factors: Ibuprofen, acetaminophen resting  FALLS: Has patient fallen in last 6 months? No - near misses - ie) getting out of the chair  LIVING ENVIRONMENT: Lives with: lives with their spouse Lives in: House/apartment Stairs: Yes: Internal: 14+ steps; on left going up and External: 2-3 steps; can reach both Has following equipment at home: Single point cane, Walker - 2 wheeled, shower chair, Grab bars, and elevated toilet seat, walk in shower with removable seat - not using it at this time  PLOF: Independent with basic ADLs, Independent with community mobility without device, Needs assistance with homemaking, and  Not driving  PATIENT GOALS: Pt reported that he wanted to be able to improve memory/recall, regain/improve balance ie) to minimize getting frozen, keep up/improve strength ie) to open doors for wife, to be able to take a container off the shelf, get the bottles of water from the car etc.  OBJECTIVE:  Note: Objective measures were completed at Evaluation unless otherwise noted.  HAND DOMINANCE: Right  ADLs: Overall ADLs: Occasional support from spouse Transfers/ambulation related to ADLs: Ind/Mod I Eating: Pt can cut some foods himself but may get help Grooming: Pt shaves himself - uses regular razor UB Dressing: Ind - may get help to pull his jacket off LB Dressing: Ind Toileting: Ind - uses regular undergarments, but has used pull-ups at night but is not using them at this time Bathing: Showers a couple of times/week and stands in the shower despite having a shower stool available Tub Shower transfers: Mod I - has grab bars Equipment: Shower seat without back, Grab bars, Walk in shower, and Reacher  IADLs: Not driving due to vision trouble - ie) limited peripheral vision Shopping: Goes with spouse Light housekeeping: Pt reports helping to wash dishes Meal Prep: Helps spouse but is slower getting things done.  He likes to cook breakfast. Community mobility: Ind without AE Medication management: Spouse helps take care of meds Financial management: Spouse does this  Handwriting: 50-75% legible - By pt report "it sucks."  Pt had improved legibility with printing verus writing.  MOBILITY STATUS: Independent and remote h/o falls  POSTURE COMMENTS:  rounded shoulders, forward head, increased thoracic kyphosis, flexed trunk , and weight shift left   ACTIVITY TOLERANCE: Activity tolerance: Good  FUNCTIONAL OUTCOME MEASURES: Fastening/unfastening 3 buttons: 46.24 sec Physical performance test: PPT#2 (simulated eating) 16.96 & PPT#4 (donning/doffing jacket): 28.58  sec  COORDINATION: 9 Hole Peg test: Right: 31.54 sec; Left: 34.65 sec Box and Blocks:  Right 38 blocks, Left 38 blocks  UE ROM:  WFL - slight end range limitations  UE MMT:   WFL - 4-/5 Grip Strength:  Right: 53.1, 56.4, 55.7 - Average 55.1 lbs Left:  42.7, 51.3, 48.2 - Average 47.4 lbs  SENSATION: Pt reports his fingertips get cold and "turn blue", (not thumbs)   MUSCLE TONE: WFL  COGNITION: Overall cognitive status:  slower processing by report ie) gets frozen and asks for frequent feedback from spouse for memory  PD OBSERVATIONS: Dyskinesias, Postural tremors, and pt and spouse report some freezing   VISION: Pt has history of glaucoma and some eye injections with loss of peripheral vision, R eye limitations and confirms that he has used magnify glass.  Pt has Iphone.  TREATMENT DATE:   iPhone use***    PATIENT EDUCATION: Education details: vision strategies;  Person educated: Patient Education method: Explanation, Demonstration, and Verbal cues Education comprehension: verbalized understanding, returned demonstration, verbal cues required, and needs further education  HOME EXERCISE PROGRAM: TBD  GOALS: Goals reviewed with patient? Yes GOALS:  SHORT TERM GOALS: Target date: 10/22/23  Pt will be independent with PD specific HEP. Baseline: not yet initiated Goal status: IN Progress  2.  Pt will independently recall and demonstrate at least 3 compensatory strategies/adaptations for visual impairment/memory aide without cueing including technology options with IPhone  Baseline: New to outpt OT - h/o glaucoma, has used magnify glass Goal status: INITIAL  3.  Pt will improve confidence with using his own strength ie) to open doors for wife, to be able to take a container off the shelf, and carry items from the car  Baseline: UE strength: 4-/5; Grip: Right:  55.1 lbs Left: 47.4 lbs Goal status: INITIAL  4.  Pt will write a grocery list x 10 words with no significant decrease in size and maintain 75% legibility. Baseline: 50-75% legibility Goal status: INITIAL  5.  Pt will demonstrate fine motor coordination for ADLs as evidenced by no decline and/or improvement in 9 hole peg test score and Box and Blocks Test for BUE. Baseline: 9 Hole: Right: 31.54 sec; Left: 34.65 sec  Box and Blocks: Right 38 blocks, Left 38 blocks Goal status: IN Progress   LONG TERM GOALS: Target date: 12/21/2023    Pt will verbalize understanding of ways to prevent future PD related complications and PD community resources. Baseline: not yet initiated Goal status: INITIAL  2.  Pt will verbalize understanding of ways to keep thinking skills sharp/WARM strategies and ways to compensate for STM changes now and in the future, including use of Memory Notebook. Baseline: not yet initiated Goal status: INITIAL  3.  Pt will verbalize understanding of adapted strategies to maximize safety, independence and timeliness with ADLs/IADLs. Baseline: Physical performance test: PPT#2 (simulated eating) 16.96 & PPT#4 (donning/doffing jacket): 28.58 sec Goal status: INITIAL  4.  Pt will demonstrate improved FM skills for max ease with fastening buttons as evidenced by decreasing 3 button/unbutton time by 5+ seconds Baseline: 46.24 secs Goal status: INITIAL    ASSESSMENT:  CLINICAL IMPRESSION: ***   PERFORMANCE DEFICITS: in functional skills including ADLs, IADLs, coordination, dexterity, proprioception, sensation, tone, ROM, strength, pain, flexibility, Fine motor control, Gross motor control, mobility, balance, body mechanics, endurance, continence, decreased knowledge of precautions, decreased knowledge of use of DME, vision, and UE functional use, cognitive skills including attention, energy/drive, learn, memory, problem solving, safety awareness, thought, and understand, and  psychosocial skills including coping strategies, environmental adaptation, and routines and behaviors.   IMPAIRMENTS: are limiting patient from ADLs, IADLs, and leisure.   COMORBIDITIES:  has co-morbidities such as glaucoma, history of back and foot surgery, HTN  that affects occupational performance. Patient will benefit from skilled OT to address above impairments and improve overall function.  REHAB POTENTIAL: Good   PLAN:  OT FREQUENCY: 2x/week  OT DURATION: 6 weeks  PLANNED INTERVENTIONS: 97168 OT Re-evaluation, 97535 self care/ADL training, 29562 therapeutic exercise, 97530 therapeutic activity, 97112 neuromuscular re-education, [phone] aquatic therapy, balance training, functional mobility training, visual/perceptual remediation/compensation, psychosocial skills training, energy conservation, coping strategies training, patient/family education, and DME and/or AE instructions  RECOMMENDED OTHER SERVICES:   CONSULTED AND AGREED WITH PLAN OF CARE: Patient and family member/caregiver  PLAN FOR NEXT SESSION:  Pwr! Moves  Visual comp strategies (.visualcompensatorystrategies)  Begin/progress HEP training - strength, coordination and vision WARM memory strategies Memory Note Book - or Notes on phone - Sees ST for eval on 4/15 ADL comp strategies   Delana Meyer, OT 10/12/2023, 1:25 PM

## 2023-10-12 NOTE — Therapy (Signed)
 OUTPATIENT PHYSICAL THERAPY NEURO TREATMENT   Patient Name: [name] MRN: [id number] DOB:September 30, 1950, 73 y.o., male Today's Date: 10/12/2023   PCP: Ronnald Nian, MD REFERRING PROVIDER: Ihor Austin, NP  END OF SESSION:  PT End of Session - 10/12/23 1319     Visit Number 3    Number of Visits 13    Date for PT Re-Evaluation 11/16/23    Authorization Type [employer] Medicare    PT Start Time 1318    PT Stop Time 1357    PT Time Calculation (min) 39 min    Equipment Utilized During Treatment Gait belt    Activity Tolerance Patient tolerated treatment well    Behavior During Therapy WFL for tasks assessed/performed             Past Medical History:  Diagnosis Date   Allergy    seasonal   Arthritis    BPH (benign prostatic hyperplasia)    Complication of anesthesia    Pt. stated he had a reaction that ended in him requiring urinary cath placement; hx delirium worsening parkinson symptoms   Diverticulosis    GERD (gastroesophageal reflux disease)    esophageal spasms   Glaucoma    Gout    Head injury, closed, with concussion    Hepatitis C    chronic - Has been treated with Harvoni   HLD (hyperlipidemia)    statin intolerant (Crestor & Simvastatin) - Taking Livalo 1mg  / week   Hypertension    Parkinson's disease (HCC)    Plantar fasciitis    right   PVD (peripheral vascular disease) (HCC)    With no claudication; only mild abdominal aortic atherosclerosis noted on ultrasound.   Spinal stenosis of lumbar region    Thoracic ascending aortic aneurysm (HCC)    4.2 cm ascending TAA 09/2016 CT, 1 yr f/u rec   Ulcer    Past Surgical History:  Procedure Laterality Date   BUNIONECTOMY WITH WEIL OSTEOTOMY Right 11/02/2019   Procedure: Right Foot Lapidus, Modified McBride Bunionectomy,  Hallux Akin Osteotomy;  Surgeon: Toni Arthurs, MD;  Location: [facility];  Service: Orthopedics;  Laterality: Right;   CARDIAC CATHETERIZATION  2005   30% Cx. Dr.  Elsie Lincoln   cataract surgery Left 11/05/2014   COLONOSCOPY     COLONOSCOPY N/A 01/09/2021   Procedure: COLONOSCOPY;  Surgeon: Kerin Salen, MD;  Location: [facility];  Service: Gastroenterology;  Laterality: N/A;   ESOPHAGOGASTRODUODENOSCOPY N/A 05/02/2015   Procedure: ESOPHAGOGASTRODUODENOSCOPY (EGD);  Surgeon: Bernette Redbird, MD;  Location: [facility];  Service: Endoscopy;  Laterality: N/A;   ESOPHAGOGASTRODUODENOSCOPY  05/02/2015   no source of pt chest pain endoscopically evident. small hiatal hernia.   ESOPHAGOGASTRODUODENOSCOPY (EGD) WITH PROPOFOL N/A 01/02/2021   Procedure: ESOPHAGOGASTRODUODENOSCOPY (EGD) WITH PROPOFOL;  Surgeon: Vida Rigger, MD;  Location: [facility];  Service: Endoscopy;  Laterality: N/A;   EYE SURGERY     HARDWARE REMOVAL Right 11/02/2019   Procedure: Second Metatarsal Removal of Deep Implant and Rotational Osteotomy;  Surgeon: Toni Arthurs, MD;  Location: [facility];  Service: Orthopedics;  Laterality: Right;   IR ANGIOGRAM SELECTIVE EACH ADDITIONAL VESSEL  01/04/2021   IR ANGIOGRAM SELECTIVE EACH ADDITIONAL VESSEL  01/04/2021   IR ANGIOGRAM VISCERAL SELECTIVE  01/04/2021   IR US GUIDE VASC ACCESS RIGHT  01/04/2021   LUMBAR LAMINECTOMY/DECOMPRESSION MICRODISCECTOMY N/A 07/03/2021   Procedure: Lumbar three through five decompression with lumbar three through five insitu fusion;  Surgeon: Venita Lick, MD;  Location: [facility];  Service: Orthopedics;  Laterality: N/A;   MEMBRANE PEEL Left 03/14/2014   Procedure: MEMBRANE PEEL; ENDOLASER;  Surgeon: Edmon Crape, MD;  Location: [facility];  Service: Ophthalmology;  Laterality: Left;   NM MYOVIEW LTD  10/2015   LOW RISK. Small, fixed basal lateral defect - likely diaphragmatic attenuation. 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  No R WMA.  GRII DD-moderately dilated LA..  Mildly dilated RV but normal function.  Normal RAP/CVP.  Trivial AI with mild to moderate sclerosis-no stenosis   UMBILICAL HERNIA REPAIR  2023   UPPER GASTROINTESTINAL ENDOSCOPY     WEIL OSTEOTOMY Right 09/01/2017   Procedure: RIGHT GREAT TOE CHEVRON AND WEIL OSTEOTOMY 2ND METATARSAL;  Surgeon: Nadara Mustard, MD;  Location: [facility];  Service: Orthopedics;  Laterality: Right;   XI ROBOTIC ASSISTED VENTRAL HERNIA N/A 03/17/2022   Procedure: XI ROBOTIC ASSISTED VENTRAL HERNIA;  Surgeon: Henrene Dodge, MD;  Location: [facility];  Service: General;  Laterality: N/A;   Patient Active Problem List   Diagnosis Date Noted   Ventral hernia without obstruction or gangrene    Primary open angle glaucoma of left eye, mild stage 06/23/2021   Facial numbness 12/24/2020   Neovascular glaucoma, right eye, stage unspecified 12/16/2020   Lumbar radiculopathy 11/25/2020   Gastroesophageal reflux disease 09/10/2020   Dry eyes, bilateral 08/15/2020   Presbycusis of right ear 01/17/2020   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   Secondary corneal edema of right eye 12/12/2019   Ptosis of right eyelid 12/12/2019   OAB (overactive bladder) 11/29/2019   Bunion of great toe of right foot 07/14/2017   Claw toe, acquired, right 07/14/2017   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   Medication management 10/09/2016   Chest pain at rest 07/04/2015   Plantar fasciitis of right foot 02/19/2015   Depression 08/14/2014   Family history of heart disease in male family member before age 45 04/26/2014   Mild cognitive impairment 03/13/2014   Parkinsonian tremor (HCC) 02/12/2014   Hyperlipidemia with target LDL less than 100    Abdominal aortic atherosclerosis (HCC)    Moderate essential hypertension  02/25/2011   Arthropathy 02/25/2011   HAV (hallux abducto valgus) 02/25/2011    ONSET DATE: 09/08/2023 (referral)   REFERRING DIAG: G20.C (ICD-10-CM) - Primary parkinsonism (HCC)  THERAPY DIAG:  Other abnormalities of gait and mobility  Unsteadiness on feet  Other lack of coordination  Rationale for Evaluation and Treatment: Rehabilitation  SUBJECTIVE:  SUBJECTIVE STATEMENT: Pt presents without AD. Reports some questions about exercises given last session. Denies falls and near falls since he was last here.   Pt accompanied by: self and  Wife, Britta Mccreedy (dropped pt off)   PERTINENT HISTORY:  tremor (2014), parkinsonism, allergies, arthritis, reflux disease, glaucoma, gout, hepatitis C, hyperlipidemia, hypertension, plantar fasciitis, peripheral vascular disease, and BPH, admitted 12/8 for L3-5 spinal fusion   PAIN:  Are you having pain? No  PRECAUTIONS: Fall and Other: blind in R eye  RED FLAGS: None   WEIGHT BEARING RESTRICTIONS: No  FALLS: Has patient fallen in last 6 months? No and reports frequent near misses   LIVING ENVIRONMENT: Lives with: lives with their spouse Lives in: House/apartment Stairs: Yes: Internal: 2 flights steps; on left going up and External: 1 in front and 3 in side steps; none Has following equipment at home: Single point cane, Walker - 2 wheeled, shower chair, and Grab bars  PLOF: Needs assistance with ADLs and Needs assistance with homemaking  PATIENT GOALS: "Just wanting to have as much control as I can"   OBJECTIVE:  Note: Objective measures were completed at Evaluation unless otherwise noted.  DIAGNOSTIC FINDINGS: CT of R foot from 2021    IMPRESSION: 1. Interval surgical arthrodesis at the 1st tarsometatarsal articulation. There is no solid osseous  ankylosis across the joint, and there is intraosseous cyst formation dorsally. 2. Interval placement of a cortical screw in the medial base of the 1st proximal phalanx without hardware loosening or associated osseous abnormality. 3. Stable deformity of the 1st metatarsal head and neck consistent with previous osteotomy. Stable chronic Freiberg infraction and postsurgical changes in the 2nd metatarsal head. 4. Stable additional mild degenerative changes. No acute findings or significant joint effusions.  COGNITION: Overall cognitive status: Impaired   SENSATION: Pt reports occasional numbness/tingling in BLEs    MUSCLE TONE: LLE: Rigidity   POSTURE: rounded shoulders, forward head, increased thoracic kyphosis, flexed trunk , and weight shift left  LOWER EXTREMITY ROM:     Active  Right Eval Left Eval  Hip flexion    Hip extension    Hip abduction    Hip adduction    Hip internal rotation    Hip external rotation    Knee flexion    Knee extension    Ankle dorsiflexion    Ankle plantarflexion    Ankle inversion    Ankle eversion     (Blank rows = not tested)  LOWER EXTREMITY MMT:    MMT Right Eval Left Eval  Hip flexion    Hip extension    Hip abduction    Hip adduction    Hip internal rotation    Hip external rotation    Knee flexion    Knee extension    Ankle dorsiflexion    Ankle plantarflexion    Ankle inversion    Ankle eversion    (Blank rows = not tested)  BED MOBILITY:  Pt reports independence w/this but it is difficult due to elevated height of the bed and foot pain   TRANSFERS: Assistive device utilized: None  Sit to stand: SBA Stand to sit: SBA Chair to chair: SBA Pt demonstrates laterally instability to R side, wide BOS, decreased eccentric control   GAIT: Gait pattern: step through pattern, decreased arm swing- Left, decreased stride length, lateral hip instability, decreased trunk rotation, trunk flexed, and wide BOS Distance walked:  Various clinic distances  Assistive device utilized: None Level of assistance: SBA and CGA  Comments: Pt maintains L hand in fist. Wide BOS w/decreased lateral weight shift. Pt lost balance posteriorly w/turn, requiring CGA for safety   VITALS  Vitals:   10/12/23 1323  BP: 126/83  Pulse: (!) 54                                                                                                                   TREATMENT:   Self-care/home management  Assessed BP (see above) and WNL for therapy  Ther Act  Reviewed prior HEP as due to cognitive deficits patient with some questions about movement patterns with truncal mobility and weight shift reaching; encouraged patient to bring spouse in the future to review further for cueing Performed x10 windmills (intermittent mirroring to maintain amplitude) Performed 2x10 reps of cross body reaching to target for weight shift external target cue (SBA-CGA) Weight shift stepping over black blocks with same side body reaching to targets to promote postural control and stepping reaction 3 x 10 resp (SBA-CGA and min cues) Quarter turns stepping over blocks with external targets set around room 2 x 8 reps of each (SBA)   NMR: Pt performs PWR! Moves in standing position with chair in front x 2 x 12 reps, with teach back method with patient performing independently following pictures to reinforce carryover for home; recommend reinforcing in future session   PWR! Up for improved posture  PWR! Rock for improved weighshifting  PWR! Twist for improved trunk rotation   PWR! Step for improved step initiation   Cues provided for amplitude of movement, stepping and sequencing; safety with chair in front.  PATIENT EDUCATION: Education details: Review + additions to HEP Person educated: Patient Education method: Programmer, multimedia, Demonstration, Verbal cues, and Handouts Education comprehension: verbalized understanding, returned demonstration, verbal cues required,  and needs further education  HOME EXERCISE PROGRAM: Access Code: [email or web address] URL: [email or web address] Date: 10/05/2023 Prepared by: Alethia Berthold Plaster  Exercises - Seated Windmill Trunk Rotation Stretch  - 1 x daily - 7 x weekly - 3 sets - 10 reps - Standing Reach to Opposite Side with Weight Shift  - 1 x daily - 7 x weekly - 3 sets - 10 reps  Standing PWR! Moves with chair in front   GOALS: Goals reviewed with patient? Yes  SHORT TERM GOALS: Target date: 10/26/2023   Pt will be independent with initial HEP for improved strength, balance, transfers and gait.  Baseline: not established on eval  Goal status: INITIAL  2.  MiniBest to be assessed and LTG updated  Baseline:  Goal status: INITIAL  3.  Pt and wife will verbalize and demonstrate freezing strategies to implement at home and in community for reduced fall risk  Baseline:  Goal status: INITIAL  4.  MCTSIB to be assessed and LTG updated Baseline:  Goal status: MET    LONG TERM GOALS: Target date: 11/09/2023   Pt will verbalize understanding of local PD community resources, including fitness post DC.   Baseline:  Goal status: INITIAL  2. Pt will improve  MiniBest to 23/28 for decreased fall risk and improvement with compensatory stepping strategies.   Baseline: 19/28 Goal status: REVISED   3.  Pt will improve 5 x STS to less than or equal to 15 seconds w/o UE support and proper body mechanics to demonstrate improved functional strength and transfer efficiency.   Baseline: 19.55s no UE support, weight shift to R Goal status: INITIAL  4.  Pt will perform floor transfer w/SBA for improved fall recovery and safety  Baseline:  Goal status: INITIAL  5.  MCTSIB goal Baseline: 120/120 Goal status: DC due to high baseline score    ASSESSMENT:  CLINICAL IMPRESSION: Emphasis of skilled PT session on review of former HEP, addition of PWR! Moves for stepping strategy, weight shift, and postural control,  and ended with quarter turns/weight shift tasks with external cues. Will do a trial of PWR! Moves for HEP but may simplify for future sessions as cognition and sequencing were somewhat challenging in today's session. Patient does continue to have more challenges with weight shift to the L and due to decreased visual and proprioceptive feedback had some challenges with step over tasks. Continue POC.   OBJECTIVE IMPAIRMENTS: Abnormal gait, decreased activity tolerance, decreased balance, decreased cognition, decreased coordination, decreased knowledge of condition, decreased knowledge of use of DME, decreased mobility, difficulty walking, decreased strength, decreased safety awareness, impaired sensation, impaired UE functional use, impaired vision/preception, improper body mechanics, and pain.   ACTIVITY LIMITATIONS: carrying, lifting, bending, standing, squatting, stairs, transfers, bed mobility, bathing, dressing, hygiene/grooming, and locomotion level  PARTICIPATION LIMITATIONS: meal prep, cleaning, laundry, medication management, driving, shopping, community activity, and yard work  PERSONAL FACTORS: Age, Fitness, Past/current experiences, and 1-2 comorbidities: PD, blindness in R eye  are also affecting patient's functional outcome.   REHAB POTENTIAL: Fair due to impaired vision, PD  CLINICAL DECISION MAKING: Evolving/moderate complexity  EVALUATION COMPLEXITY: Moderate  PLAN:  PT FREQUENCY: 1-2x/week  PT DURATION: 6 weeks  PLANNED INTERVENTIONS: 97164- PT Re-evaluation, 97110-Therapeutic exercises, 97530- Therapeutic activity, 97112- Neuromuscular re-education, 97535- Self Care, 95621- Manual therapy, [phone]- Gait training, [phone]- Orthotic Fit/training, [phone]- Canalith repositioning, [id number]- Aquatic Therapy, [phone]- Electrical stimulation (manual), Patient/Family education, Balance training, Stair training, Dry Needling, Joint mobilization, Vestibular training, and DME instructions  PLAN  FOR NEXT SESSION:  Add to HEP to work on turns, truncal mobility and compensation strategies for impaired peripheral vision, lateral weight shift to L, review PWR! Moves as indicated but may discontinue for cognition  Carmelia Bake, PT, DPT 10/12/2023, 3:40 PM

## 2023-10-13 ENCOUNTER — Other Ambulatory Visit: Payer: Self-pay

## 2023-10-13 DIAGNOSIS — M4316 Spondylolisthesis, lumbar region: Secondary | ICD-10-CM | POA: Diagnosis not present

## 2023-10-14 ENCOUNTER — Ambulatory Visit: Payer: Medicare Other | Admitting: Adult Health

## 2023-10-14 ENCOUNTER — Encounter: Payer: Self-pay | Admitting: Physical Therapy

## 2023-10-14 ENCOUNTER — Other Ambulatory Visit: Payer: Self-pay

## 2023-10-14 ENCOUNTER — Ambulatory Visit: Admitting: Occupational Therapy

## 2023-10-14 ENCOUNTER — Other Ambulatory Visit (HOSPITAL_COMMUNITY): Payer: Self-pay

## 2023-10-14 ENCOUNTER — Encounter: Payer: Self-pay | Admitting: Adult Health

## 2023-10-14 ENCOUNTER — Ambulatory Visit: Admitting: Physical Therapy

## 2023-10-14 VITALS — BP 118/74 | HR 55

## 2023-10-14 VITALS — BP 143/93 | HR 55 | Ht 69.0 in | Wt 149.0 lb

## 2023-10-14 DIAGNOSIS — R2681 Unsteadiness on feet: Secondary | ICD-10-CM | POA: Diagnosis not present

## 2023-10-14 DIAGNOSIS — R2689 Other abnormalities of gait and mobility: Secondary | ICD-10-CM

## 2023-10-14 DIAGNOSIS — R29818 Other symptoms and signs involving the nervous system: Secondary | ICD-10-CM | POA: Diagnosis not present

## 2023-10-14 DIAGNOSIS — R251 Tremor, unspecified: Secondary | ICD-10-CM

## 2023-10-14 DIAGNOSIS — G4733 Obstructive sleep apnea (adult) (pediatric): Secondary | ICD-10-CM | POA: Diagnosis not present

## 2023-10-14 DIAGNOSIS — G20C Parkinsonism, unspecified: Secondary | ICD-10-CM

## 2023-10-14 DIAGNOSIS — G3184 Mild cognitive impairment, so stated: Secondary | ICD-10-CM | POA: Diagnosis not present

## 2023-10-14 DIAGNOSIS — R41844 Frontal lobe and executive function deficit: Secondary | ICD-10-CM

## 2023-10-14 DIAGNOSIS — M6281 Muscle weakness (generalized): Secondary | ICD-10-CM | POA: Diagnosis not present

## 2023-10-14 DIAGNOSIS — R278 Other lack of coordination: Secondary | ICD-10-CM

## 2023-10-14 DIAGNOSIS — R41842 Visuospatial deficit: Secondary | ICD-10-CM

## 2023-10-14 MED ORDER — CARBIDOPA-LEVODOPA 25-100 MG PO TABS
1.5000 | ORAL_TABLET | Freq: Three times a day (TID) | ORAL | 3 refills | Status: DC
  Filled 2023-10-14: qty 405, 90d supply, fill #0
  Filled 2023-10-15: qty 165, 37d supply, fill #0
  Filled 2023-10-15: qty 405, 90d supply, fill #0
  Filled 2023-11-01 – 2023-11-12 (×4): qty 165, 37d supply, fill #1
  Filled 2023-11-29 – 2023-12-24 (×4): qty 165, 37d supply, fill #2
  Filled 2024-01-24 – 2024-02-11 (×5): qty 165, 37d supply, fill #3
  Filled 2024-03-02: qty 135, 30d supply, fill #3
  Filled 2024-03-02: qty 165, 37d supply, fill #3
  Filled 2024-03-27 – 2024-03-28 (×2): qty 135, 30d supply, fill #4
  Filled ????-??-??: fill #3

## 2023-10-14 MED ORDER — TRIHEXYPHENIDYL HCL 2 MG PO TABS
ORAL_TABLET | ORAL | 11 refills | Status: AC
  Filled 2023-10-14: qty 15, 30d supply, fill #0
  Filled 2023-10-18: qty 30, 30d supply, fill #0
  Filled 2023-11-01 – 2023-11-11 (×2): qty 30, 30d supply, fill #1
  Filled 2023-11-29 – 2023-12-04 (×2): qty 30, 30d supply, fill #2
  Filled 2023-12-27 – 2024-01-04 (×4): qty 30, 30d supply, fill #3
  Filled 2024-01-24 – 2024-03-02 (×5): qty 30, 30d supply, fill #4
  Filled 2024-03-28: qty 30, 30d supply, fill #5
  Filled 2024-04-28 – 2024-05-01 (×2): qty 30, 30d supply, fill #6
  Filled 2024-05-27 – 2024-06-06 (×3): qty 30, 30d supply, fill #7
  Filled 2024-07-07: qty 30, 30d supply, fill #8
  Filled 2024-08-06: qty 30, 30d supply, fill #9
  Filled 2024-09-01: qty 30, 30d supply, fill #10
  Filled ????-??-??: fill #4

## 2023-10-14 MED ORDER — RIVASTIGMINE TARTRATE 4.5 MG PO CAPS
4.5000 mg | ORAL_CAPSULE | Freq: Two times a day (BID) | ORAL | 3 refills | Status: AC
  Filled 2023-10-14 – 2023-10-18 (×2): qty 60, 30d supply, fill #0
  Filled 2023-11-01 – 2023-11-11 (×2): qty 60, 30d supply, fill #1
  Filled 2023-11-29 – 2023-12-04 (×2): qty 60, 30d supply, fill #2
  Filled 2023-12-27 – 2024-01-04 (×3): qty 60, 30d supply, fill #3
  Filled 2024-01-24 – 2024-03-14 (×5): qty 60, 30d supply, fill #4
  Filled 2024-04-12: qty 60, 30d supply, fill #5
  Filled 2024-04-28 – 2024-05-09 (×2): qty 60, 30d supply, fill #6
  Filled 2024-06-05: qty 60, 30d supply, fill #7
  Filled 2024-08-08: qty 60, 30d supply, fill #9
  Filled ????-??-??: fill #8

## 2023-10-14 NOTE — Patient Instructions (Addendum)
 Your Plan:  Continue Sinemet 1.5 tab three times daily  Continue Artane 0.5 tabs twice daily   Continue Exelon 1 tab twice daily  You will be called by [facility] to review different mask options. Ensure you are using CPAP nightly and greater than 4 hours per night    Follow up in 6 months or call earlier if needed     Thank you for coming to see [place] at [facility]. I hope we have been able to provide you high quality care today.  You may receive a patient satisfaction survey over the next few weeks. We would appreciate your feedback and comments so that we may continue to improve ourselves and the health of our patients.

## 2023-10-14 NOTE — Therapy (Signed)
 OUTPATIENT PHYSICAL THERAPY NEURO TREATMENT   Patient Name: [name] MRN: [id number] DOB:[birth year], 73 y.o., male Today's Date: 10/14/2023   PCP: Ronnald Nian, MD REFERRING PROVIDER: Ihor Austin, NP  END OF SESSION:  PT End of Session - 10/14/23 1409     Visit Number 4    Number of Visits 13    Date for PT Re-Evaluation 11/16/23    Authorization Type [employer] Medicare    PT Start Time 1405    PT Stop Time 1444    PT Time Calculation (min) 39 min    Equipment Utilized During Treatment Gait belt    Activity Tolerance Patient tolerated treatment well    Behavior During Therapy WFL for tasks assessed/performed             Past Medical History:  Diagnosis Date   Allergy    seasonal   Arthritis    BPH (benign prostatic hyperplasia)    Complication of anesthesia    Pt. stated he had a reaction that ended in him requiring urinary cath placement; hx delirium worsening parkinson symptoms   Diverticulosis    GERD (gastroesophageal reflux disease)    esophageal spasms   Glaucoma    Gout    Head injury, closed, with concussion    Hepatitis C    chronic - Has been treated with Harvoni   HLD (hyperlipidemia)    statin intolerant (Crestor & Simvastatin) - Taking Livalo 1mg  / week   Hypertension    Parkinson's disease (HCC)    Plantar fasciitis    right   PVD (peripheral vascular disease) (HCC)    With no claudication; only mild abdominal aortic atherosclerosis noted on ultrasound.   Spinal stenosis of lumbar region    Thoracic ascending aortic aneurysm (HCC)    4.2 cm ascending TAA 09/2016 CT, 1 yr f/u rec   Ulcer    Past Surgical History:  Procedure Laterality Date   BUNIONECTOMY WITH WEIL OSTEOTOMY Right 11/02/2019   Procedure: Right Foot Lapidus, Modified McBride Bunionectomy,  Hallux Akin Osteotomy;  Surgeon: Toni Arthurs, MD;  Location: [facility];  Service: Orthopedics;  Laterality: Right;   CARDIAC CATHETERIZATION  2005   30% Cx. Dr.  Elsie Lincoln   cataract surgery Left 11/05/2014   COLONOSCOPY     COLONOSCOPY N/A 01/09/2021   Procedure: COLONOSCOPY;  Surgeon: Kerin Salen, MD;  Location: [facility];  Service: Gastroenterology;  Laterality: N/A;   ESOPHAGOGASTRODUODENOSCOPY N/A 05/02/2015   Procedure: ESOPHAGOGASTRODUODENOSCOPY (EGD);  Surgeon: Bernette Redbird, MD;  Location: [facility];  Service: Endoscopy;  Laterality: N/A;   ESOPHAGOGASTRODUODENOSCOPY  05/02/2015   no source of pt chest pain endoscopically evident. small hiatal hernia.   ESOPHAGOGASTRODUODENOSCOPY (EGD) WITH PROPOFOL N/A 01/02/2021   Procedure: ESOPHAGOGASTRODUODENOSCOPY (EGD) WITH PROPOFOL;  Surgeon: Vida Rigger, MD;  Location: [facility];  Service: Endoscopy;  Laterality: N/A;   EYE SURGERY     HARDWARE REMOVAL Right 11/02/2019   Procedure: Second Metatarsal Removal of Deep Implant and Rotational Osteotomy;  Surgeon: Toni Arthurs, MD;  Location: [facility];  Service: Orthopedics;  Laterality: Right;   IR ANGIOGRAM SELECTIVE EACH ADDITIONAL VESSEL  01/04/2021   IR ANGIOGRAM SELECTIVE EACH ADDITIONAL VESSEL  01/04/2021   IR ANGIOGRAM VISCERAL SELECTIVE  01/04/2021   IR US GUIDE VASC ACCESS RIGHT  01/04/2021   LUMBAR LAMINECTOMY/DECOMPRESSION MICRODISCECTOMY N/A 07/03/2021   Procedure: Lumbar three through five decompression with lumbar three through five insitu fusion;  Surgeon: Venita Lick, MD;  Location: [facility];  Service: Orthopedics;  Laterality: N/A;   MEMBRANE PEEL Left 03/14/2014   Procedure: MEMBRANE PEEL; ENDOLASER;  Surgeon: Edmon Crape, MD;  Location: [facility];  Service: Ophthalmology;  Laterality: Left;   NM MYOVIEW LTD  10/2015   LOW RISK. Small, fixed basal lateral defect - likely diaphragmatic attenuation. 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  No R WMA.  GRII DD-moderately dilated LA..  Mildly dilated RV but normal function.  Normal RAP/CVP.  Trivial AI with mild to moderate sclerosis-no stenosis   UMBILICAL HERNIA REPAIR  2023   UPPER GASTROINTESTINAL ENDOSCOPY     WEIL OSTEOTOMY Right 09/01/2017   Procedure: RIGHT GREAT TOE CHEVRON AND WEIL OSTEOTOMY 2ND METATARSAL;  Surgeon: Nadara Mustard, MD;  Location: [facility];  Service: Orthopedics;  Laterality: Right;   XI ROBOTIC ASSISTED VENTRAL HERNIA N/A 03/17/2022   Procedure: XI ROBOTIC ASSISTED VENTRAL HERNIA;  Surgeon: Henrene Dodge, MD;  Location: [facility];  Service: General;  Laterality: N/A;   Patient Active Problem List   Diagnosis Date Noted   Ventral hernia without obstruction or gangrene    Primary open angle glaucoma of left eye, mild stage 06/23/2021   Facial numbness 12/24/2020   Neovascular glaucoma, right eye, stage unspecified 12/16/2020   Lumbar radiculopathy 11/25/2020   Gastroesophageal reflux disease 09/10/2020   Dry eyes, bilateral 08/15/2020   Presbycusis of right ear 01/17/2020   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   Secondary corneal edema of right eye 12/12/2019   Ptosis of right eyelid 12/12/2019   OAB (overactive bladder) 11/29/2019   Bunion of great toe of right foot 07/14/2017   Claw toe, acquired, right 07/14/2017   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   Medication management 10/09/2016   Chest pain at rest 07/04/2015   Plantar fasciitis of right foot 02/19/2015   Depression 08/14/2014   Family history of heart disease in male family member before age 54 04/26/2014   Mild cognitive impairment 03/13/2014   Parkinsonian tremor (HCC) 02/12/2014   Hyperlipidemia with target LDL less than 100    Abdominal aortic atherosclerosis (HCC)    Moderate essential hypertension  02/25/2011   Arthropathy 02/25/2011   HAV (hallux abducto valgus) 02/25/2011    ONSET DATE: 09/08/2023 (referral)   REFERRING DIAG: G20.C (ICD-10-CM) - Primary parkinsonism (HCC)  THERAPY DIAG:  Other abnormalities of gait and mobility  Unsteadiness on feet  Rationale for Evaluation and Treatment: Rehabilitation  SUBJECTIVE:  SUBJECTIVE STATEMENT: Pt presents without AD. Reports some questions about exercises given last session. Denies falls and near falls since he was last here.   Pt accompanied by: self and  Wife, Britta Mccreedy  PERTINENT HISTORY:  tremor (2014), parkinsonism, allergies, arthritis, reflux disease, glaucoma, gout, hepatitis C, hyperlipidemia, hypertension, plantar fasciitis, peripheral vascular disease, and BPH, admitted 12/8 for L3-5 spinal fusion   PAIN:  Are you having pain? No  PRECAUTIONS: Fall and Other: blind in R eye  RED FLAGS: None   WEIGHT BEARING RESTRICTIONS: No  FALLS: Has patient fallen in last 6 months? No and reports frequent near misses   LIVING ENVIRONMENT: Lives with: lives with their spouse Lives in: House/apartment Stairs: Yes: Internal: 2 flights steps; on left going up and External: 1 in front and 3 in side steps; none Has following equipment at home: Single point cane, Walker - 2 wheeled, shower chair, and Grab bars  PLOF: Needs assistance with ADLs and Needs assistance with homemaking  PATIENT GOALS: "Just wanting to have as much control as I can"   OBJECTIVE:  Note: Objective measures were completed at Evaluation unless otherwise noted.  DIAGNOSTIC FINDINGS: CT of R foot from 2021    IMPRESSION: 1. Interval surgical arthrodesis at the 1st tarsometatarsal articulation. There is no solid osseous ankylosis across the joint, and there is  intraosseous cyst formation dorsally. 2. Interval placement of a cortical screw in the medial base of the 1st proximal phalanx without hardware loosening or associated osseous abnormality. 3. Stable deformity of the 1st metatarsal head and neck consistent with previous osteotomy. Stable chronic Freiberg infraction and postsurgical changes in the 2nd metatarsal head. 4. Stable additional mild degenerative changes. No acute findings or significant joint effusions.  COGNITION: Overall cognitive status: Impaired   SENSATION: Pt reports occasional numbness/tingling in BLEs    MUSCLE TONE: LLE: Rigidity   POSTURE: rounded shoulders, forward head, increased thoracic kyphosis, flexed trunk , and weight shift left  LOWER EXTREMITY ROM:     Active  Right Eval Left Eval  Hip flexion    Hip extension    Hip abduction    Hip adduction    Hip internal rotation    Hip external rotation    Knee flexion    Knee extension    Ankle dorsiflexion    Ankle plantarflexion    Ankle inversion    Ankle eversion     (Blank rows = not tested)  LOWER EXTREMITY MMT:    MMT Right Eval Left Eval  Hip flexion    Hip extension    Hip abduction    Hip adduction    Hip internal rotation    Hip external rotation    Knee flexion    Knee extension    Ankle dorsiflexion    Ankle plantarflexion    Ankle inversion    Ankle eversion    (Blank rows = not tested)  BED MOBILITY:  Pt reports independence w/this but it is difficult due to elevated height of the bed and foot pain   TRANSFERS: Assistive device utilized: None  Sit to stand: SBA Stand to sit: SBA Chair to chair: SBA Pt demonstrates laterally instability to R side, wide BOS, decreased eccentric control   GAIT: Gait pattern: step through pattern, decreased arm swing- Left, decreased stride length, lateral hip instability, decreased trunk rotation, trunk flexed, and wide BOS Distance walked: Various clinic distances  Assistive  device utilized: None Level of assistance: SBA and CGA Comments: Pt maintains L  hand in fist. Wide BOS w/decreased lateral weight shift. Pt lost balance posteriorly w/turn, requiring CGA for safety   VITALS  Vitals:   10/14/23 1412  BP: 118/74  Pulse: (!) 55                                                                                                                    TREATMENT:   Self-care/home management  Assessed BP (see above) and WNL for therapy Briefly reviewed education on freezing of gait / festination with stopping and external stepping to target, educated spouse on how she could cue patient briefly  Ther Act  Spouse present and per patient request reviewed prior HEP briefly; also discontinued PWR! Moves and reduced to initial 2 exercises in order to simplify and improve compliance with HEP Performed 2x10 windmills as review of HEP and as break for back pain during session (intermittent mirroring to maintain amplitude) Cross body reaching to target for weight shift external target cue - reviewed verbally only with spouse Standing med ball slams for large amplitude movements in preparation for exercise below x5 reps - improved with feedback from bounce of ball (CGA) Step back with 4lb med ball slam and mix of CGA-minA for stepping reaction, anticipatory balance, and large amplitude movements 1 x 6 reps over each leg Min tolerance due to back and tendency for forward flexed posture    NMR:  For truncal mobility, postural re-education, large amplitude movements, weight shift, and stepping with external cues for feedback Counter behind patient with 2 external targets on shelves at limits of stability patient rotate and hit on same side with boomwaker the two external targets x 10 reps each (SBA) Counter behind patient with 2 external targets on shelves at limits of stability patient rotate and hit on same side with boomwaker the two external targets and lateral stepping out to  two dots on ground 2 x 10 reps each (SBA) 2 instances of posterior LOB requiring stabilizing on counter Counter behind patient with 2 external targets on shelves at limits of stability patient rotate and hit on same side with boomwaker the two external targets and lateral stepping out to two dots on ground with large amplitude step over yoga block 2 x 6 reps each (SBA) 4-5 instances of not clearing blocks and 2 posterior LOB with counter to stabilize   Between // bars   For weight shift and static balance reactions with functional reaching tasks  Standing on rocker board on A/P direction reaching to basket of bean bags and tossing to platforms x 4 tosses each arm with SBA-CGA and no LOB Standing on rocker board in A/P direction placing ball into therapists hand with cross body reaching into shoulder flexion x 5 reps (minA frequently due to anterior and posterior LOB)    PATIENT EDUCATION: Education details: See above for modifications to HEP  Person educated: Patient and Spouse Education method: Explanation, Demonstration, Verbal cues, and Handouts Education comprehension: verbalized understanding, returned demonstration, verbal cues required, and needs  further education  HOME EXERCISE PROGRAM: Access Code: [email or web address] URL: [email or web address] Date: 10/05/2023 Prepared by: Alethia Berthold Plaster  Exercises - Seated Windmill Trunk Rotation Stretch  - 1 x daily - 7 x weekly - 3 sets - 10 reps - Standing Reach to Opposite Side with Weight Shift  - 1 x daily - 7 x weekly - 3 sets - 10 reps  *PWR! Moves discontinued as too cognitively challenging for patient at this time  GOALS: Goals reviewed with patient? Yes  SHORT TERM GOALS: Target date: 10/26/2023   Pt will be independent with initial HEP for improved strength, balance, transfers and gait.  Baseline: not established on eval  Goal status: INITIAL  2.  MiniBest to be assessed and LTG updated  Baseline:  Goal status:  INITIAL  3.  Pt and wife will verbalize and demonstrate freezing strategies to implement at home and in community for reduced fall risk  Baseline:  Goal status: INITIAL  4.  MCTSIB to be assessed and LTG updated Baseline:  Goal status: MET    LONG TERM GOALS: Target date: 11/09/2023   Pt will verbalize understanding of local PD community resources, including fitness post DC.   Baseline:  Goal status: INITIAL  2. Pt will improve MiniBest to 23/28 for decreased fall risk and improvement with compensatory stepping strategies.   Baseline: 19/28 Goal status: REVISED   3.  Pt will improve 5 x STS to less than or equal to 15 seconds w/o UE support and proper body mechanics to demonstrate improved functional strength and transfer efficiency.   Baseline: 19.55s no UE support, weight shift to R Goal status: INITIAL  4.  Pt will perform floor transfer w/SBA for improved fall recovery and safety  Baseline:  Goal status: INITIAL  5.  MCTSIB goal Baseline: 120/120 Goal status: DC due to high baseline score    ASSESSMENT:  CLINICAL IMPRESSION: Emphasis of skilled PT session on review/modification of former HEP as spouse present to help with carryover for home, discontinued PWR! Moves due to complexity. Remainer of session spent working on stepping strategy, weight shift, large amplitude movements, static balance strategies, and postural control. More instances of posterior LOB in today's session with addition of rotation tasks that require posterior shift of balance; recommend continued work on posterior stepping strategy. Patient with more complaints of back pain in today's session but reports that it does not worsen from start to end session. Continue POC.   OBJECTIVE IMPAIRMENTS: Abnormal gait, decreased activity tolerance, decreased balance, decreased cognition, decreased coordination, decreased knowledge of condition, decreased knowledge of use of DME, decreased mobility, difficulty  walking, decreased strength, decreased safety awareness, impaired sensation, impaired UE functional use, impaired vision/preception, improper body mechanics, and pain.   ACTIVITY LIMITATIONS: carrying, lifting, bending, standing, squatting, stairs, transfers, bed mobility, bathing, dressing, hygiene/grooming, and locomotion level  PARTICIPATION LIMITATIONS: meal prep, cleaning, laundry, medication management, driving, shopping, community activity, and yard work  PERSONAL FACTORS: Age, Fitness, Past/current experiences, and 1-2 comorbidities: PD, blindness in R eye  are also affecting patient's functional outcome.   REHAB POTENTIAL: Fair due to impaired vision, PD  CLINICAL DECISION MAKING: Evolving/moderate complexity  EVALUATION COMPLEXITY: Moderate  PLAN:  PT FREQUENCY: 1-2x/week  PT DURATION: 6 weeks  PLANNED INTERVENTIONS: 97164- PT Re-evaluation, 97110-Therapeutic exercises, 97530- Therapeutic activity, 97112- Neuromuscular re-education, 97535- Self Care, 29528- Manual therapy, [phone]- Gait training, [phone]- Orthotic Fit/training, [phone]- Canalith repositioning, [id number]- Aquatic Therapy, [phone]- Electrical stimulation (manual), Patient/Family education, Balance training, Stair training, Dry Needling, Joint mobilization,  Vestibular training, and DME instructions  PLAN FOR NEXT SESSION:  Add to HEP to work on turns, truncal mobility and compensation strategies for impaired peripheral vision, lateral weight shift to L, posterior stepping strategy and balance reactions when looking over shoulder   Carmelia Bake, PT, DPT 10/14/2023, 3:59 PM

## 2023-10-14 NOTE — Addendum Note (Signed)
 Addended by: Ihor Austin L on: 10/14/2023 03:49 PM   Modules accepted: Orders

## 2023-10-14 NOTE — Progress Notes (Addendum)
 [facility] PATIENT: [name] DOB: 16-Dec-1950   REASON FOR VISIT: Follow-up for tremor mild cognitive impairment, depression HISTORY FROM: Patient and wife   Chief complaint: Chief Complaint  Patient presents with   Tremors    Rm 3 with spouse  Pt is well, reports tremor is stable since last visit. Pt does mention worsening in memory since starting cpap.        HISTORY OF PRESENT ILLNESS:HISTORY:63 year African American male who since last year and a half has noticed increasing tremors mainly in the left arm and leg and to a lesser degree in the right arm as well. He states the tremors were quite mild but became more pronounced after a minor accident while staying [place] the [place]. He fell down 12 flights of steps and hit a concrete slab and had a concussion. He and some minor bruises and knee injury which took several months to reck of her period CT scan of the head was done 2 days later which was unremarkable. Since then he has had some walking difficulty but this may be related to his knee pain. He says that his feet do at times gets stuck in his started walking with a stooped posture. He however does not describe typical festination. He has resting and intermittent action tremor claiming the left arm and leg the tremor does improve with activities. The tremor does not seem to interfere with most of his routine. He denies significant bradykinesia, and drooling of saliva or micrographia. He has been evaluated by neurologists Dr. Rich Number at [facility] neurology but he is unable to tell me for the diagnosis was. He was not told that this may be Parkinson's and was not tried on dopaminergic medications. He did have an MRI scan and some lab work but I do not have those results for my review available today. Patient is here today for a second opinion as he feels his tremor is not getting better. He does admit to some light masonry  hallucinations as well as restless sleep thrashing of his legs. He denies significant memory or cognitive difficulties. He has not noticed any particular effect of alcohol on his tremor. Is no family history of tremors. Patient does have chronic hepatitis C and is planning to start treatment with dapsone. He has had no seizures, significant head injury with major loss of consciousness, stroke, TIAs or significant neurological problems.  Update 08/14/2014 : He returns for follow-up after last visit 3 months ago. He feels his tremors are about the same. He had tried reducing the dose of Sinemet but noticed that his tremors got worse and hence he went back up to the original dose which is 25/100 one tablet 3 times daily. He still feels occasionally confused and at times secondary to some cells but he admits that he worries a lot. He also admits to feeling depressed, getting tired easily and not having initiated. He has not been on any medications for depression. Patient denies significant drooling of saliva, bradykinesia, gait or balance problems. He feels his tremor is mild and does not interfere with his activities of daily living. I discussed alternative treatment options including addition of dopamine agonist, anticholinergic or even consideration for deep brain  stimulation since his tremor is predominantly unilateral however the patient does not want to consider more aggressive treatment options at the present time. UPDATE 12/26/14 [name], 73 year old black male returns for follow-up. He was last seen by Dr. Pearlean Brownie 08/14/2014.  He feels his tremors are better since increasing his carbidopa levodopa to 1-1/2 tablets 3 times daily. He is tolerating it well without dizziness, sleepiness, nightmares or hallucinations A MRI Brain with and without Contrast completed on 07/29/2011 was normal.  The Mini-Mental status exam today is unchanged 29 out of 30. He did not follow-up with his primary care for  complaints of  depression. He denies any significant drooling bradykinesia falls or balance problems. His tremor does not interfere with his activities of daily living. He returns for reevaluation Update 07/03/2015 : He returns for follow-up after last visit 6 months ago. He states his tremors continue to do well and he seems to have responded quite well to increased dose of Sinemet 25/100 1-1/2 tablet 3 times daily. Is tolerating it well without any dizziness, sleepiness, nightmares, hallucinations, nausea or diarrhea. He continues to have mild memory difficulties but his has not been on any treatment for depression and in fact has not seen his primary care physician for the same for quite some time. Update 08/11/2016 PS : He returns for follow-up after last visit year ago. He states his tremors as well as memory loss or more or less unchanged. Continues to have intermittent tremors in the left hand mainly. The tremors fluctuate he has good days and bad days. He is currently taking Sinemet 25/100 one and half tablet 3 times daily. He complains of mild fatigue and daytime sleepiness. He denies hallucinations, delusions, dizziness or upset stomach. He does not want to try higher dose because of fear of side effects. Continues to have short-term memory difficulties. He has not been quite compulsive about taking notes and using memory compensation strategies but plans to do so. He has no new complaints today. UPDATE 07/19/2018CM [name], 73 year old male returns for follow-up with tremors and mild cognitive impairment which he says is stable. He continues to have a resting tremor in the left hand which is intermittent. He continues to have good and bad days. He remains on Sinemet 25 100 (1 and half tablets) 3 times daily. He does little exercise he is not doing any memory compensation strategies at this time and was encouraged to do so. He denies any falls. He denies any hallucinations or increased confusion. He denies any  freezing spells. He does complain of left hip pain He returns for reevaluation UPDATE 1/22/2019CM [name], 89 he denies 32-year-old male returns for follow-up with a history of Parkinson's disease.  He continues to have mild resting tremor in the left hand which is intermittent.  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  He remains on Sinemet 3 times a day however he is taking his medication with a meal.  He complains of sometimes being lightheaded when he stands up too quickly.  He was encouraged to sit on the side of the bed and then get up slightly.  He was also encouraged to stay well-hydrated.  He has not been exercising.  He denies any hallucinations or increased confusion.  He has not had any freezing spells.  He returns for reevaluation 02/15/18 UPDATE:JV  Patient returns today for six-month follow-up.  He continues to take Sinemet 3 times daily with the first dose around 6 AM (1 hour prior to eating breakfast), second dose 11 AM (1 hour prior to eating lunch) and then third dose between 5:30 and 6 PM (eats dinner around 6:30-7 PM).  Patient denies any increase in symptoms between doses but does state if he is late on  a dose, he will start having increased tremors in his left upper extremity and left lower extremity.  He continues to complain of short-term memory issue and is unable to state whether it is worsening but he states he notices it more.  He did obtain a large book full avoid games but does this approximately 1 time per week.  Recommended to do these activities at least once daily.  Patient also has complaints of feeling as though he overanalyzes certain situations and feeling increased fatigue.  Patient does endorse increased stress recently but does feel somewhat situational.  Provided patient with stress relaxation techniques and advised him if this continues, to follow-up with PCP for possible medication management.  He denies any hallucinations or increased confusion.  He denies any  freezing spells. UPDATE 1/29/2020CM Mr. Maimone, 73 year old male returns for follow-up with a history of Parkinson's disease and mild cognitive impairment.  He denies any freezing spells any stiffness any falls.  He denies any difficulty swallowing.  He has spinal stenosis and had recent injection.  He is seeing Dr. Lajoyce Corners for a foot problem.  He is currently on Sinemet 3 times a day before meals.  He has not been exercising and was encouraged to do so.  He has not had any hallucinations or increased confusion.  Noted very intermittent left tremor of the hand.  Patient reports some anxiety in public.  He was encouraged to perform stress relaxation techniques.  He returns for reevaluation  : Update 02/23/2019 :  he returns for follow-up after last visit 6 months ago.  He states his tremors are doing well until a month ago when he is noticed worsening of his tremor both at rest as well as using his hands.  He remains on Sinemet 25/100 mg 1-1/2 tablet 3 times daily.  Is tolerating them well without any nausea, diarrhea, dizziness, sleepiness or hallucinations.  He states his short-term memory difficulties are unchanged.  He has not been socializing a lot or being active but plans to do that soon.  He has no other complaints.  He denies significant drooling, bradykinesia, stooped posture gait or balance problems Update 09/04/2019 : He returns for follow-up after last visit 6 months ago.  He states that his tremors continue to intermittently not do well.  He has no more bad days than good days.  Has noticed some tremor in his legs as well intermittently.  Patient was advised to try higher dose of Sinemet 2 tablets 3 times daily at last visit.  He states he was not able to tolerate this because of dizziness and had to cut back to 1-1/2 3 times daily which is tolerating better.  He is also noticed that his balance is off but he blames this on having some problems in his toes.  He has had no major falls or injuries.  He can  still get up easily from his chair.  He denies significant drooling of saliva.  He does have some at times disturbed sleep and reviewed dreams and talks in his sleep.  Gets startled easily. Update 01/10/2020 : He returns for follow-up after last visit 4 months ago.  Is accompanied by his wife.  The patient was started on Azilect at last visit but had trouble tolerating it daily due to sleepiness, tiredness and dizziness.  He has since switched to using Azilect 0.5 mg twice daily every other day and seems to be tolerating it better.  However his tremors still persists and appear to get  worse mostly when he is anxious or with severe exertion.  He feels the tremor are not particularly disabling and he can handle it.  Continues to have poor short-term memory and needs to write things down but still remembers only occasionally.  Sometimes he may remember later.  Wife notes that occasionally he may pause in midsentence and searches for words.  At times even starts stuttering as he is trying to find the word.  He has no family history of Alzheimer's or dementia.  Still fairly independent and manages most activities for himself.  He remains currently on Sinemet 25/100 1.5 tablets 4 times daily which is tolerating well without significant nightmares, hallucinations, daytime sleepiness or nausea. Update 06/28/2020 : He returns for follow-up after last visit 6 months ago.  He is accompanied by his wife.  He continues to have intermittent tremors mostly in the left hand which are bothersome and he has good days and bad days.  Patient still continues to take Artane 2 mg tablets only half a tablet twice daily on alternate days and not every day as instructed at last visit.  He remains on Sinemet 25/100 mg 1.5 tablets 3 times daily and he has had trouble tolerating higher doses in the past.  Is not having significant dizziness or upset stomach but is still having some disturbed sleep with crazy dreams.  He denies hallucinations or  delusions.  He is careful with his walking and has had a few near falls but no major falls or injuries.  He denies excessive drooling of saliva or bradykinesia or mobility issues.  He does admit to decreased short-term memory, attention and concentration difficulties but these appear to be stable and unchanged. Update 10/01/2020 Dr. Pearlean Brownie: He returns for follow-up after last visit 3 months ago.  Patient did not tolerate increasing the dose of Artane to 2 mg daily as he developed increased dryness of mouth as well as bad dreams.  He has since reduced the dose back to 1 mg daily and is feeling better.  He still has intermittent resting tremor in both left hand as well as leg which does interfere with his activities of daily living slightly.  He remains on Sinemet 25/100 mg 1.5 tablets 3 times daily which is tolerating quite well without any dizziness, sleepiness, upset stomach, nightmares or hallucinations.  He notices occasional lip twitchings in the morning only.  He has no bradykinesia or increased drooling.  He can get up easily and to most of his activities without help.  He continues to have short-term memory problems which appear unchanged.  He forgets recent appointments and events.  He states he has decreased hearing bilaterally which is also seems to complicate his understanding and memory.  He is however quite independent in activities of daily living.  Update 04/07/2021 JM: Returns for 39-month follow-up after prior visit with Dr. Pearlean Brownie.  He is accompanied by his wife, Britta Mccreedy. Since prior visit, was hopitalized back in June for GI bleed.  He developed acute confusion most likely hospital delirium requiring short course of Precedex. Per wife, she is concerned regarding worsening cognition with short term memory and delayed recall since that time as well as multiple other concerns including worsening lower back pain, decreased hearing and vision and chills/coldness sensation in both feet and hands. Tremors  have been stable with ongoing use of Sinemet and Artane.  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 Continues  to struggle with short-term memory and recall which he feels has continued to gradually, slowly decline. Examples he gives includes walking into a room and forget why he went into room or repeat similar questions. Did recently receive hearing aide for left ear and continued use of hearing aide in right ear. At times he will feel like he is in a "fog" typically associated with lightheadedness, is aware these are happening, does not lose consciousness. Per wife, this has been improving as she has been trying to encourage him to drink more water.  Will feel down or depressed due to his memory issues but otherwise denies depression. Does do memory exercises occasionally at home.  Is currently doing PT after back surgery - does report great improvement of pain since procedure. Did have post anesthesia delirium requiring short-term use of Seroquel but this has since resolved. Wife is concerned as he needs to have surgical procedure for umbilical hernia and fears recurrence of delirium. No behavioral concerns such as hallucinations, agitation, sleep difficulty, or confusion.  Remains on Cerefolin but does not believe it has been that beneficial. MMSE today 27/30 (prior 26/30)  Tremors stable with ongoing use of Sinemet and Artane, tolerating without side effects. Denies any issues with drooling, freezing, gait impairement, swallowing difficulties or bradykinesia.  No further concerns at this time   Update 04/09/2022 JM: Patient returns for 56-month follow-up.  Cognition: Has been stable since prior visit He has remained on Aricept 10 mg nightly, denies side effects Difficulties with short term memory, can forget certain tasks he is supposed to be doing, at times will be able to recall but sometime next will need to ask wife to  remind him.  He does endorse some underlying anxiety, can have difficulty making decisions on his own such as what he should be wearing that day.   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 Unsure if he is having issues with wearing off but wife has told him symptoms can worsen prior to next dosage Also remains on Artane half tablet twice daily Can have slowness of movements and gait at times, does not use assistive device, denies any recent falls  Update 10/12/2022 JM: Patient returns for 86-month follow-up accompanied by his wife.  Reports gradual cognitive decline over the past 6 months more so with short term memory (recall). Wife also mentions issues with hearing and vision, at times will forget to put in hearing aides which she believes has been contributing some. Wife notes increased difficulty with focus and follow through with tasks which has gradually worsened since prior visit but patient admits to mind racing/wandering and then can easily forget what he is supposed to be doing. MMSE today 23/30 (prior 27/30 1 yr ago). Continues on Aricept 10 mg nightly. Wife questions other medications to help with memory.   Remains on Sinemet 1.5 tab TID and Artane 1/2 tab BID for parkinsonian tremor, will miss Sinemet dose occasionally - typically takes at 6:45am, 12:30-1:30pm,6-7pm, avoids taking with meals.  Tremor has been relatively stable but can fluctuate. Some more issues with gait/balance but also has chronic right foot issue that has been gradually worsening as well as gradually worsening low back pain, is waiting to be seen by neurosurgeon Dr. Yetta Barre for further input. This has been affecting quality of life as he is not able to be as active as he wants to be. Doing  aquatic therapy which has been beneficial especially in regards to energy levels.  Update 04/05/2023 JM:  Patient returns for follow-up visit for MCI and parkinsonian tremor accompanied by his wife.   Continued left arm tremor, wife has noticed some tremor in LLE. Not overly bothersome, can fluctuate.  Continued imbalance, denies worsening, continues to have right foot pain which he feels has contributed, at times can drag his feet, very slow walking, no use of AD, no recent falls.  Did aquatic therapy for low back pain which was helpful, continues to do exercises at home.  He also recently started chair yoga and tai chi. Wife mentions fine motor movement overall slower, takes him longer to do things such as getting ready in the morning or doing certain activities. Able to maintain ADLs independently, does help with some IADLs such as cooking and cleaning.  Remains on Sinemet 1.5 tab TID and Artane 0.5 tab BID. Believes tolerating without side effects, has not noticed any wearing off symptoms but will notice worsening of symptoms if he forget a dose. Takes Sinemet at 6:45am, 12pm and 6pm. Usually wakes up at 6:45am to take the dog out and takes Sinemet dosage, will go back to bed and sleep for additional hour. Bedtime usually around 10-11pm but sometimes later, reports nocturia 3-4 times per night. Wife notes he does snore, will talk in his sleep and act out dreams (kicking and punching), wife no longer sleeps in same room. Does mention while doing different projects, watching TV or reading, his eyes will randomly close, wife will ask him if he is sleeping and is able to open his eyes, feels due to daytime fatigue, denies double vision or vision changes. Memory has been overall stable, wife has noticed taking longer to respond to questions or difficulty making decision, needs to have choices, will easily forget what he is supposed to be doing. Was switched from Aricept to Exelon prior visit.  Since prior visit, Exelon dosage gradually increased currently taking 4.5 mg BID tolerating without side effects, previously  unable to tolerate 6mg  AM and 3mg  PM dosing. MMSE today 30/30 (prior 23/30).      Update 10/14/2023 JM: Patient returns for 47-month follow-up visit as well as initial CPAP compliance visit.  He is accompanied by his wife.  Has been stable from parkinsonism standpoint. Remains on Sinemet 1.5 tab TID, has had some low BP readings during PT but quickly resolves.  Does admit to very limited water intake.  MyChart message sent shortly after prior visit with sensation of being stuck, upon further clarification of his current medications, wife reported that he is not receiving Artane (although reported being on at prior visit). It was recommended he restart at prior dosage by nursing but reported experiencing hallucinations, suspect he had been off Artane for some time therefore recommended restarting at lower dose with gradual titration to 1 mg twice daily.  He is currently taking 1 mg twice daily and tolerating well.  He is currently working with physical, occupational and speech therapy.  Remains on Exelon for memory.  Currently being followed by neurosurgery for low back pain, per wife, recommended conservative treatment options and can consider surgery as a last resort.  Currently on acetaminophen-codeine as needed which helps keep patient active.     He was evaluated by Dr. Frances Furbish for concern of underlying sleep apnea, sleep study 06/2023 showed overall mild OSA with total AHI of 11.5/h and O2 nadir of 86%.  Recommend initiation of AutoPap, set up  08/05/2023.   He has been having difficulty tolerating FFM mask, experiencing leaks frequently or on noises coming from his mask which can wake him up frequently during the night.  He has not noticed any benefit in regards to sleep quality or daytime energy levels.  He also believes his memory has declined some since starting CPAP.  He has not yet tried any other masks but would be interested in doing so.  DME [facility].         REVIEW OF SYSTEMS:  Full 14 system review of systems performed and notable only for those listed in HPI only and all other systems negative   ALLERGIES: Allergies  Allergen Reactions   Anesthesia S-I-40 [Propofol] Other (See Comments)   Aspirin Other (See Comments)   Other Other (See Comments)    Severe delirium with anesthesia per spouse enhances parkinsons symptoms    HOME MEDICATIONS: Outpatient Medications Prior to Visit  Medication Sig Dispense Refill   acetaminophen (TYLENOL) 500 MG tablet Take 2 tablets (1,000 mg total) by mouth every 6 (six) hours as needed for mild pain.     alfuzosin (UROXATRAL) 10 MG 24 hr tablet Take 1 tablet (10 mg total) by mouth at bedtime. 90 tablet 3   alfuzosin (UROXATRAL) 10 MG 24 hr tablet Take 1 tablet (10 mg total) by mouth daily. 90 tablet 3   atorvastatin (LIPITOR) 20 MG tablet Take 1 tablet (20 mg total) by mouth daily. 90 tablet 3   carbidopa-levodopa (SINEMET IR) 25-100 MG tablet Take 1&1/2 tablets by mouth before breakfast,  at noon and every evening as directed 405 tablet 3   citalopram (CELEXA) 20 MG tablet Take 1 tablet (20 mg total) by mouth at bedtime. 90 tablet 1   dorzolamide-timolol (COSOPT) 2-0.5 % ophthalmic solution Place 1 drop into both eyes 2 (two) times daily. 10 mL 11   dorzolamide-timolol (COSOPT) 22.3-6.8 MG/ML ophthalmic solution Instill 1 drop into both eyes twice a day 10 mL 11   FIBER ADULT GUMMIES PO Take 1 tablet by mouth 2 (two) times daily.     ibuprofen (ADVIL) 600 MG tablet Take 1 tablet (600 mg total) by mouth every 8 (eight) hours as needed for moderate pain. 60 tablet 1   ibuprofen (ADVIL) 600 MG tablet Take 1 tablet (600 mg total) by mouth every 8 (eight) hours as needed for moderate pain. 60 tablet 3   ibuprofen (ADVIL) 600 MG tablet Take 1 tablet (600 mg total) by mouth 3 (three) times daily as needed. 90 tablet 2   influenza vaccine adjuvanted (FLUAD) 0.5 ML injection Inject into the muscle. 0.5 mL 0   irbesartan (AVAPRO) 75 MG  tablet Take 1 tablet (75 mg total) by mouth every morning. 30 tablet 2   mirabegron ER (MYRBETRIQ) 50 MG TB24 tablet Take 1 tablet (50 mg total) by mouth daily. 30 tablet 11   Multiple Vitamins-Minerals (MULTIVITAMIN ADULTS 50+ PO) Take 1 tablet by mouth daily.     MYRBETRIQ 50 MG TB24 tablet Take 1 tablet (50 mg total) by mouth daily. 30 tablet 11   Netarsudil Dimesylate (RHOPRESSA) 0.02 % SOLN Place 1 drop into the left eye every evening. 2.5 mL 11   Netarsudil-Latanoprost (ROCKLATAN) 0.02-0.005 % SOLN Place 1 drop into the left eye every evening. 5 mL 3   Netarsudil-Latanoprost (ROCKLATAN) 0.02-0.005 % SOLN Place 1 drop into the left eye every night at bedtime. 2.5 mL 3   nitroGLYCERIN (NITROSTAT) 0.4 MG SL tablet Place 1 tablet (0.4  mg total) under the tongue every 5 (five) minutes as needed for chest pain. 25 tablet 3   pantoprazole (PROTONIX) 40 MG tablet Take 1 tablet (40 mg total) by mouth 2 (two) times daily. 180 tablet 2   rivastigmine (EXELON) 4.5 MG capsule Take 1 capsule (4.5 mg total) by mouth 2 (two) times daily. 60 capsule 5   verapamil (CALAN-SR) 120 MG CR tablet Take 1 tablet (120 mg total) by mouth every morning. 90 tablet 3   Vibegron (GEMTESA) 75 MG TABS Take 1 tablet (75 mg total) by mouth daily. 30 tablet 1   trihexyphenidyl (ARTANE) 2 MG tablet TAKE 1/2 TABLET DAILY FOR ONE WEEK, THEN TAKE 1/2 TABLET BY MOUTH TWICE DAILY WITH A MEAL 30 tablet 5   No facility-administered medications prior to visit.    PAST MEDICAL HISTORY: Past Medical History:  Diagnosis Date   Allergy    seasonal   Arthritis    BPH (benign prostatic hyperplasia)    Complication of anesthesia    Pt. stated he had a reaction that ended in him requiring urinary cath placement; hx delirium worsening parkinson symptoms   Diverticulosis    GERD (gastroesophageal reflux disease)    esophageal spasms   Glaucoma    Gout    Head injury, closed, with concussion    Hepatitis C    chronic - Has been  treated with Harvoni   HLD (hyperlipidemia)    statin intolerant (Crestor & Simvastatin) - Taking Livalo 1mg  / week   Hypertension    Parkinson's disease (HCC)    Plantar fasciitis    right   PVD (peripheral vascular disease) (HCC)    With no claudication; only mild abdominal aortic atherosclerosis noted on ultrasound.   Spinal stenosis of lumbar region    Thoracic ascending aortic aneurysm (HCC)    4.2 cm ascending TAA 09/2016 CT, 1 yr f/u rec   Ulcer     PAST SURGICAL HISTORY: Past Surgical History:  Procedure Laterality Date   BUNIONECTOMY WITH WEIL OSTEOTOMY Right 11/02/2019   Procedure: Right Foot Lapidus, Modified McBride Bunionectomy,  Hallux Akin Osteotomy;  Surgeon: Toni Arthurs, MD;  Location: [facility];  Service: Orthopedics;  Laterality: Right;   CARDIAC CATHETERIZATION  2005   30% Cx. Dr. Elsie Lincoln   cataract surgery Left 11/05/2014   COLONOSCOPY     COLONOSCOPY N/A 01/09/2021   Procedure: COLONOSCOPY;  Surgeon: Kerin Salen, MD;  Location: [facility];  Service: Gastroenterology;  Laterality: N/A;   ESOPHAGOGASTRODUODENOSCOPY N/A 05/02/2015   Procedure: ESOPHAGOGASTRODUODENOSCOPY (EGD);  Surgeon: Bernette Redbird, MD;  Location: [facility];  Service: Endoscopy;  Laterality: N/A;   ESOPHAGOGASTRODUODENOSCOPY  05/02/2015   no source of pt chest pain endoscopically evident. small hiatal hernia.   ESOPHAGOGASTRODUODENOSCOPY (EGD) WITH PROPOFOL N/A 01/02/2021   Procedure: ESOPHAGOGASTRODUODENOSCOPY (EGD) WITH PROPOFOL;  Surgeon: Vida Rigger, MD;  Location: [facility];  Service: Endoscopy;  Laterality: N/A;   EYE SURGERY     HARDWARE REMOVAL Right 11/02/2019   Procedure: Second Metatarsal Removal of Deep Implant and Rotational Osteotomy;  Surgeon: Toni Arthurs, MD;  Location: [facility];  Service: Orthopedics;  Laterality: Right;   IR ANGIOGRAM SELECTIVE EACH ADDITIONAL VESSEL  01/04/2021   IR ANGIOGRAM SELECTIVE EACH ADDITIONAL VESSEL   01/04/2021   IR ANGIOGRAM VISCERAL SELECTIVE  01/04/2021   IR US GUIDE VASC ACCESS RIGHT  01/04/2021   LUMBAR LAMINECTOMY/DECOMPRESSION MICRODISCECTOMY N/A 07/03/2021   Procedure: Lumbar three through five decompression with lumbar three through five  insitu fusion;  Surgeon: Venita Lick, MD;  Location: [facility];  Service: Orthopedics;  Laterality: N/A;   MEMBRANE PEEL Left 03/14/2014   Procedure: MEMBRANE PEEL; ENDOLASER;  Surgeon: Edmon Crape, MD;  Location: [facility];  Service: Ophthalmology;  Laterality: Left;   NM MYOVIEW LTD  10/2015   LOW RISK. Small, fixed basal lateral defect - likely diaphragmatic attenuation. 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  No R WMA.  GRII DD-moderately dilated LA..  Mildly dilated RV but normal function.  Normal RAP/CVP.  Trivial AI with mild to moderate sclerosis-no stenosis   UMBILICAL HERNIA REPAIR  2023   UPPER GASTROINTESTINAL ENDOSCOPY     WEIL OSTEOTOMY Right 09/01/2017   Procedure: RIGHT GREAT TOE CHEVRON AND WEIL OSTEOTOMY 2ND METATARSAL;  Surgeon: Nadara Mustard, MD;  Location: [facility];  Service: Orthopedics;  Laterality: Right;   XI ROBOTIC ASSISTED VENTRAL HERNIA N/A 03/17/2022   Procedure: XI ROBOTIC ASSISTED VENTRAL HERNIA;  Surgeon: Henrene Dodge, MD;  Location: [facility];  Service: General;  Laterality: N/A;    FAMILY HISTORY: Family History  Problem Relation Age of Onset   Heart disease Mother    Cancer Sister    Cancer Paternal Grandfather    Sleep apnea Neg Hx     SOCIAL HISTORY: Social History   Socioeconomic History   Marital status: Married    Spouse name: barbra gen   Number of children: 3   Years of education: AS   Highest education level: Not on file   Occupational History   Occupation: self employed    Employer: [employer]    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  Tobacco Use   Smoking status: Former    Types: Cigars    Quit date: 07/27/1988    Years since quitting: 35.2   Smokeless tobacco: Never  Vaping Use   Vaping status: Never Used  Substance and Sexual Activity   Alcohol use: Yes    Alcohol/week: 3.0 standard drinks of alcohol    Types: 1 Glasses of wine, 1 Cans of beer, 1 Shots of liquor per week    Comment: occasional   Drug use: No   Sexual activity: Yes  Other Topics Concern   Not on file  Social History Narrative   Lives with wife    Right handed   Drinks 1-2 cups caffeine daily   Social Drivers of Health   Financial Resource Strain: Low Risk  (10/13/2022)   Overall Financial Resource Strain (CARDIA)    Difficulty of Paying Living Expenses: Not hard at all  Food Insecurity: No Food Insecurity (10/13/2022)   Hunger Vital Sign    Worried About Running Out of Food in the Last Year: Never true    Ran Out of Food in the Last Year: Never true  Transportation Needs: No Transportation Needs (10/13/2022)   PRAPARE - Administrator, Civil Service (Medical): No    Lack of Transportation (Non-Medical): No  Physical Activity: Insufficiently Active (10/13/2022)   Exercise Vital Sign    Days of Exercise per Week: 2 days    Minutes of Exercise per Session: 60 min  Stress: No Stress Concern Present (10/13/2022)   [employer]  of Occupational Health - Occupational Stress Questionnaire    Feeling of Stress : Only a little  Social Connections: Not on file  Intimate Partner Violence: Not on file     PHYSICAL EXAM  Vitals:   10/14/23 1503  BP: (!) 143/93  Pulse: (!) 55  Weight: 149 lb (67.6 kg)  Height: 5\' 9"  (1.753 m)   Body mass index is 22 kg/m.  Generalized: Very pleasant elderly African-American male in no acute distress   Head: normocephalic and atraumatic  Musculoskeletal: No deformity  Neurological  examination  Mentation: Awake and alert.  Fluent speech and language.  Mild hypophonia.  Mild facial masking.    04/05/2023    4:14 PM 10/12/2022   10:59 AM 10/06/2021    2:34 PM  MMSE - Mini Mental State Exam  Orientation to time 5 4 5   Orientation to Place 5 5 5   Registration 3 3 3   Attention/ Calculation 5 1 5   Recall 3 2 2   Language- name 2 objects 2 1 2   Language- repeat 1 1 1   Language- follow 3 step command 3 3 1   Language- read & follow direction 1 1 1   Write a sentence 1 1 1   Copy design 1 1 1   Total score 30 23 27    Cranial nerve II-XII: Pupils were equal round reactive to light extraocular movements were full, visual field were full on confrontational test. Facial sensation and strength were normal.  Hearing aids bilaterally.  Uvula tongue midline. head turning and shoulder shrug were normal and symmetric.Tongue protrusion into cheek strength was normal. Motor: normal bulk and tone, full strength in the BUE, BLE, .  Diminished amplitude of finger tapping on the left, intermittent left hand and leg resting tremor, mild cogwheel rigidity upon activation at the left wrist, very slight at right. Tremor improves with action and intention.  No bradykinesia bilaterally. Unable to appreciate tremor in RUE  Sensory: normal and symmetric to light touch, on the face arms and legs  Coordination: finger-nose-finger, heel-to-shin bilaterally, no dysmetria Reflexes: 1+ upper lower and symmetric plantar responses were flexor bilaterally. Gait and Station: Rising up from seated position without assistance, no festination or stooped posture.  Ambulated with decreased right length and step height bilaterally but no obvious shuffling and mild unsteadiness, decreased arm swing bilaterally with left pill-rolling tremor, difficulty with turns, no AD       DIAGNOSTIC DATA (LABS, IMAGING, TESTING) - I reviewed patient records, labs, notes, testing and imaging myself where available.  Lab Results   Component Value Date   WBC 5.8 01/19/2023   HGB 12.9 (L) 01/19/2023   HCT 39.6 01/19/2023   MCV 90 01/19/2023   PLT 235 01/19/2023      Component Value Date/Time   NA 136 01/19/2023 0919   K 4.5 01/19/2023 0919   CL 102 01/19/2023 0919   CO2 23 01/19/2023 0919   GLUCOSE 88 01/19/2023 0919   GLUCOSE 92 06/30/2021 1430   BUN 27 01/19/2023 0919   CREATININE 1.26 01/19/2023 0919   CREATININE 1.06 06/29/2014 0920   CALCIUM 9.7 01/19/2023 0919   PROT 7.2 01/19/2023 0919   ALBUMIN 4.5 01/19/2023 0919   AST 18 01/19/2023 0919   ALT 7 01/19/2023 0919   ALKPHOS 70 01/19/2023 0919   BILITOT 0.3 01/19/2023 0919   GFRNONAA >60 06/30/2021 1430   GFRAA 68 09/10/2020 1058   Lab Results  Component Value Date   CHOL 136 01/19/2023   HDL 55 01/19/2023  LDLCALC 72 01/19/2023   TRIG 37 01/19/2023   CHOLHDL 2.5 01/19/2023   Lab Results  Component Value Date   TSH 3.131 01/11/2021     ASSESSMENT AND PLAN 73 y.o. year old male  has a medical history of resting left arm and leg tremors with mild features of early left sided Parkinson's disease initially present in 2014 with associated tremor, bradykinesia, gait impairment/imbalance, REM sleep behavior and cognitive impairment   1.  Parkinson's disease -Overall relatively stable -Recommend continuation of Sinemet 1.5 tab TID and Artane 2mg  0.5 tab BID -Experiencing low blood pressures during PT, discussed increasing water intake, if low BP persists can consider transitioning Sinemet IR to CR -Continue PT/OT/SLP -recommend trying melatonin to help with REM sleep disorder -prior MMSE 30/30 (prior 23/30) - continue Exelon 4.5 mg BID, repeat MMSE at f/u visit, c/o worsening cognition since initiating CPAP but suspect this is more due to difficulty tolerating mask which could be causing poor sleep contributing to feeling of worsening memory   2.  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 -d/x'd 06/2023, CPAP initiated 07/2023 -Difficulty tolerating FFM and high  leak rate, will request change of interface to hybrid mask or nasal mask, may possibly have to use chinstrap and -Compliance report shows satisfactory usage with residual AHI 5.1 -Discussed importance of nightly usage with ensuring greater than 4 hours per night for optimal benefit -Continue to follow with your DME company for any needed supplies or CPAP related concerns      Follow-up in 6 months or call earlier if needed     CC:  Ronnald Nian, MD   I spent 45 minutes of face-to-face and non-face-to-face time with patient and wife.  This included previsit chart review, lab review, study review, order entry, electronic health record documentation, patient education and discussion regarding diagnoses and treatment plan and answered all the questions to patient and wife satisfaction  Ihor Austin, AGNP-BC  [facility] [address] [place], [place] [address]  Phone [phone] Fax [phone] Note: This document was prepared with digital dictation and possible smart phrase technology. Any transcriptional errors that result from this process are unintentional.

## 2023-10-14 NOTE — Therapy (Signed)
 OUTPATIENT OCCUPATIONAL THERAPY PARKINSON'S TREATMENT  Patient Name: [name] MRN: [id number] DOB:Jan 28, 1951, 73 y.o., male Today's Date: 10/14/2023  PCP: Ronnald Nian, MD REFERRING PROVIDER: Ihor Austin, NP  END OF SESSION:  OT End of Session - 10/14/23 1318     Visit Number 4    Number of Visits 13   including eval/tx   Date for OT Re-Evaluation 11/24/23    Authorization Type [employer] 2025    Authorization Time Period Auth# [id number] 09/28/23 to 11/09/23 Parkinsons [employer] 2025    Authorization - Number of Visits 13    Progress Note Due on Visit 10    OT Start Time 1317    OT Stop Time 1400    OT Time Calculation (min) 43 min    Equipment Utilized During Treatment Memory Notebook    Activity Tolerance Patient tolerated treatment well    Behavior During Therapy WFL for tasks assessed/performed             Past Medical History:  Diagnosis Date   Allergy    seasonal   Arthritis    BPH (benign prostatic hyperplasia)    Complication of anesthesia    Pt. stated he had a reaction that ended in him requiring urinary cath placement; hx delirium worsening parkinson symptoms   Diverticulosis    GERD (gastroesophageal reflux disease)    esophageal spasms   Glaucoma    Gout    Head injury, closed, with concussion    Hepatitis C    chronic - Has been treated with Harvoni   HLD (hyperlipidemia)    statin intolerant (Crestor & Simvastatin) - Taking Livalo 1mg  / week   Hypertension    Parkinson's disease (HCC)    Plantar fasciitis    right   PVD (peripheral vascular disease) (HCC)    With no claudication; only mild abdominal aortic atherosclerosis noted on ultrasound.   Spinal stenosis of lumbar region    Thoracic ascending aortic aneurysm (HCC)    4.2 cm ascending TAA 09/2016 CT, 1 yr f/u rec   Ulcer    Past Surgical History:  Procedure Laterality Date   BUNIONECTOMY WITH WEIL OSTEOTOMY Right 11/02/2019   Procedure: Right Foot Lapidus, Modified  McBride Bunionectomy,  Hallux Akin Osteotomy;  Surgeon: Toni Arthurs, MD;  Location: [facility];  Service: Orthopedics;  Laterality: Right;   CARDIAC CATHETERIZATION  2005   30% Cx. Dr. Elsie Lincoln   cataract surgery Left 11/05/2014   COLONOSCOPY     COLONOSCOPY N/A 01/09/2021   Procedure: COLONOSCOPY;  Surgeon: Kerin Salen, MD;  Location: [facility];  Service: Gastroenterology;  Laterality: N/A;   ESOPHAGOGASTRODUODENOSCOPY N/A 05/02/2015   Procedure: ESOPHAGOGASTRODUODENOSCOPY (EGD);  Surgeon: Bernette Redbird, MD;  Location: [facility];  Service: Endoscopy;  Laterality: N/A;   ESOPHAGOGASTRODUODENOSCOPY  05/02/2015   no source of pt chest pain endoscopically evident. small hiatal hernia.   ESOPHAGOGASTRODUODENOSCOPY (EGD) WITH PROPOFOL N/A 01/02/2021   Procedure: ESOPHAGOGASTRODUODENOSCOPY (EGD) WITH PROPOFOL;  Surgeon: Vida Rigger, MD;  Location: [facility];  Service: Endoscopy;  Laterality: N/A;   EYE SURGERY     HARDWARE REMOVAL Right 11/02/2019   Procedure: Second Metatarsal Removal of Deep Implant and Rotational Osteotomy;  Surgeon: Toni Arthurs, MD;  Location: [facility];  Service: Orthopedics;  Laterality: Right;   IR ANGIOGRAM SELECTIVE EACH ADDITIONAL VESSEL  01/04/2021   IR ANGIOGRAM SELECTIVE EACH ADDITIONAL VESSEL  01/04/2021   IR ANGIOGRAM VISCERAL SELECTIVE  01/04/2021   IR US GUIDE VASC  ACCESS RIGHT  01/04/2021   LUMBAR LAMINECTOMY/DECOMPRESSION MICRODISCECTOMY N/A 07/03/2021   Procedure: Lumbar three through five decompression with lumbar three through five insitu fusion;  Surgeon: Venita Lick, MD;  Location: [facility] OR;  Service: Orthopedics;  Laterality: N/A;   MEMBRANE PEEL Left 03/14/2014   Procedure: MEMBRANE PEEL; ENDOLASER;  Surgeon: Edmon Crape, MD;  Location: [facility] OR;  Service: Ophthalmology;  Laterality: Left;   NM MYOVIEW LTD  10/2015   LOW RISK. Small, fixed basal lateral defect - likely diaphragmatic attenuation. 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  No R WMA.  GRII DD-moderately dilated LA..  Mildly dilated RV but normal function.  Normal RAP/CVP.  Trivial AI with mild to moderate sclerosis-no stenosis   UMBILICAL HERNIA REPAIR  2023   UPPER GASTROINTESTINAL ENDOSCOPY     WEIL OSTEOTOMY Right 09/01/2017   Procedure: RIGHT GREAT TOE CHEVRON AND WEIL OSTEOTOMY 2ND METATARSAL;  Surgeon: Nadara Mustard, MD;  Location: [facility] OR;  Service: Orthopedics;  Laterality: Right;   XI ROBOTIC ASSISTED VENTRAL HERNIA N/A 03/17/2022   Procedure: XI ROBOTIC ASSISTED VENTRAL HERNIA;  Surgeon: Henrene Dodge, MD;  Location: [facility] ORS;  Service: General;  Laterality: N/A;   Patient Active Problem List   Diagnosis Date Noted   Ventral hernia without obstruction or gangrene    Primary open angle glaucoma of left eye, mild stage 06/23/2021   Facial numbness 12/24/2020   Neovascular glaucoma, right eye, stage unspecified 12/16/2020   Lumbar radiculopathy 11/25/2020   Gastroesophageal reflux disease 09/10/2020   Dry eyes, bilateral 08/15/2020   Presbycusis of right ear 01/17/2020   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   Secondary corneal edema of right eye 12/12/2019   Ptosis of right eyelid 12/12/2019   OAB (overactive bladder) 11/29/2019   Bunion of great toe of right foot 07/14/2017   Claw toe, acquired, right 07/14/2017   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   Medication management 10/09/2016   Chest pain at rest 07/04/2015   Plantar fasciitis of right foot 02/19/2015   Depression 08/14/2014   Family history of heart disease in male family member before age 20 04/26/2014   Mild cognitive  impairment 03/13/2014   Parkinsonian tremor (HCC) 02/12/2014   Hyperlipidemia with target LDL less than 100    Abdominal aortic atherosclerosis (HCC)    Moderate essential hypertension 02/25/2011   Arthropathy 02/25/2011   HAV (hallux abducto valgus) 02/25/2011   ONSET DATE: 09/20/2023  REFERRING DIAG: G20.C (ICD-10-CM) - Parkinsonism, unspecified  THERAPY DIAG:  Other lack of coordination  Muscle weakness (generalized)  Tremor  Visuospatial deficit  Other symptoms and signs involving the nervous system  Frontal lobe and executive function deficit  Rationale for Evaluation and Treatment: Rehabilitation  SUBJECTIVE:   SUBJECTIVE STATEMENT:  Pt accompanied by: self and significant other - Britta Mccreedy ("Bee")   Pt and spouse report ongoing difficulty with memory ie) pt will ask spouse multiple times for the same questions.  Spouse reports she finds she is getting more frustrated by the requests and pt is also able to identify that she is getting irritated with his questions.   Pt did  bring his binder with his PT exercises and a calendar of his therapy appts.  PERTINENT HISTORY: Tremor (2015), parkinsonism, allergies, arthritis, reflux disease, glaucoma, gout, hepatitis C, hyperlipidemia, hypertension, plantar fasciitis, peripheral vascular disease, and BPH, admitted 07/03/2021 for L3-5 spinal fusion Back, foot, hernia, neuro surgery (pains)  PRECAUTIONS: Fall and Other: Vision loss R eye  WEIGHT BEARING RESTRICTIONS: No  PAIN: Pain in back  Are you having pain? Yes: NPRS scale: 2/10 Pain location: Lower back (every day) and some down right leg Pain description: If I get up - can be sharp pains, sometimes aching  Aggravating factors: Moving, bending, lifting Relieving factors: Ibuprofen, acetaminophen, resting  FALLS: Has patient fallen in last 6 months? No - near misses - ie) getting out of the chair  LIVING ENVIRONMENT: Lives with: lives with their spouse Lives in:  House/apartment Stairs: Yes: Internal: 14+ steps; on left going up and External: 2-3 steps; can reach both Has following equipment at home: Single point cane, Walker - 2 wheeled, shower chair, Grab bars, and elevated toilet seat, walk in shower with removable seat - not using it at this time  PLOF: Independent with basic ADLs, Independent with community mobility without device, Needs assistance with homemaking, and Not driving  PATIENT GOALS: Pt reported that he wanted to be able to improve memory/recall, regain/improve balance ie) to minimize getting frozen, keep up/improve strength ie) to open doors for wife, to be able to take a container off the shelf, get the bottles of water from the car etc.  OBJECTIVE:  Note: Objective measures were completed at Evaluation unless otherwise noted.  HAND DOMINANCE: Right  ADLs: Overall ADLs: Occasional support from spouse Transfers/ambulation related to ADLs: Ind/Mod I Eating: Pt can cut some foods himself but may get help Grooming: Pt shaves himself - uses regular razor UB Dressing: Ind - may get help to pull his jacket off LB Dressing: Ind Toileting: Ind - uses regular undergarments, but has used pull-ups at night but is not using them at this time Bathing: Showers a couple of times/week and stands in the shower despite having a shower stool available Tub Shower transfers: Mod I - has grab bars Equipment: Shower seat without back, Grab bars, Walk in shower, and Reacher  IADLs: Not driving due to vision trouble - ie) limited peripheral vision Shopping: Goes with spouse Light housekeeping: Pt reports helping to wash dishes Meal Prep: Helps spouse but is slower getting things done.  He likes to cook breakfast. Community mobility: Ind without AE Medication management: Spouse helps take care of meds Financial management: Spouse does this  Handwriting: 50-75% legible - By pt report "it sucks."  Pt had improved legibility with printing verus  writing.  MOBILITY STATUS: Independent and remote h/o falls  POSTURE COMMENTS:  rounded shoulders, forward head, increased thoracic kyphosis, flexed trunk , and weight shift left   ACTIVITY TOLERANCE: Activity tolerance: Good  FUNCTIONAL OUTCOME MEASURES: Fastening/unfastening 3 buttons: 46.24 sec Physical performance test: PPT#2 (simulated eating) 16.96 & PPT#4 (donning/doffing jacket): 28.58 sec  COORDINATION: 9 Hole Peg test: Right: 31.54 sec; Left: 34.65 sec Box and Blocks:  Right 38 blocks, Left 38 blocks  UE ROM:  WFL - slight end range limitations  UE MMT:   WFL - 4-/5 Grip Strength:  Right: 53.1, 56.4, 55.7 - Average 55.1 lbs Left:  42.7, 51.3, 48.2 - Average 47.4 lbs  SENSATION: Pt reports his fingertips get cold and "turn blue", (not thumbs)   MUSCLE TONE: WFL  COGNITION: Overall  cognitive status:  slower processing by report ie) gets frozen and asks for frequent feedback from spouse for memory  PD OBSERVATIONS: Dyskinesias, Postural tremors, and pt and spouse report some freezing   VISION: Pt has history of glaucoma and some eye injections with loss of peripheral vision, R eye limitations and confirms that he has used magnify glass.  Pt has Iphone.                                                                                                                    TODAY'S TREATMENT:   Initiated education in [employer] especially r/t using the binder that he brought today.   Reviewed aspects of the "WARM" strategy ie) W= write it down, A=  associate it, R=  repeat it and M=  make a mental picture as well as combining physical activity to help him remember ie) using some of his "big movements" to remember something.   Pt had noted that he will walk out of his room to get a drink of water and forget why he went to the kitchen.  He is encouraged to use repetition, and associate the object with a physical cue or motion.  Helped pt and spouse expand  his options to when using his Memory Notebook. Pt had a 3-ring notebook with idea to create sections for different topics ie) calendar, medications, therapy exercises etc.  Pt did ask how often he should do his exercises and he was encouraged to try something daily and/or as he needed it to improve his physical abilities with plans to explore specific calendar of recommendations by end of therapy.  Recommended use of sticky notes. Placed sticky notes inside his binder to make simple notes on ie) DR 245 and then placed it on the front of his binder to allow him to see.  Pt observed turning to his spouse to answer the question about when his appt was later in the session but with tactile cue, he did refer back to his notebook for the answer.  Pt/spouse encouraged to have sticky notes at different places at home to take notes.  Pt did practice taking a simple note with cues to print big, keep info simple and maximize legibility of info so he didn't have to ask his spouse what he had written.   PATIENT EDUCATION: Education details: Memory strategies Person educated: Patient Education method: Explanation, Demonstration, and Verbal cues Education comprehension: verbalized understanding, returned demonstration, verbal cues required, and needs further education  HOME EXERCISE PROGRAM: TBD  GOALS: Goals reviewed with patient? Yes GOALS:  SHORT TERM GOALS: Target date: 10/22/23  Pt will be independent with PD specific HEP. Baseline: not yet initiated Goal status: IN Progress  2.  Pt will independently recall and demonstrate at least 3 compensatory strategies/adaptations for visual impairment/memory aide without cueing including technology options with IPhone  Baseline: New to outpt OT - h/o glaucoma, has used magnify glass Goal status: IN Progress  3.  Pt will improve confidence with  using his own strength ie) to open doors for wife, to be able to take a container off the shelf, and carry items  from the car  Baseline: UE strength: 4-/5; Grip: Right: 55.1 lbs Left: 47.4 lbs Goal status: INITIAL  4.  Pt will write a grocery list x 10 words with no significant decrease in size and maintain 75% legibility. Baseline: 50-75% legibility Goal status: IN Progress  5.  Pt will demonstrate fine motor coordination for ADLs as evidenced by no decline and/or improvement in 9 hole peg test score and Box and Blocks Test for BUE. Baseline: 9 Hole: Right: 31.54 sec; Left: 34.65 sec  Box and Blocks: Right 38 blocks, Left 38 blocks Goal status: IN Progress   LONG TERM GOALS: Target date: 12/21/2023    Pt will verbalize understanding of ways to prevent future PD related complications and PD community resources. Baseline: not yet initiated Goal status: IN Progress  2.  Pt will verbalize understanding of ways to keep thinking skills sharp/WARM strategies and ways to compensate for STM changes now and in the future, including use of Memory Notebook. Baseline: not yet initiated Goal status: IN Progress  3.  Pt will verbalize understanding of adapted strategies to maximize safety, independence and timeliness with ADLs/IADLs. Baseline: Physical performance test: PPT#2 (simulated eating) 16.96 & PPT#4 (donning/doffing jacket): 28.58 sec Goal status: IN Progress  4.  Pt will demonstrate improved FM skills for max ease with fastening buttons as evidenced by decreasing 3 button/unbutton time by 5+ seconds Baseline: 46.24 secs Goal status: INITIAL    ASSESSMENT:  CLINICAL IMPRESSION: Patient is a 73 y.o. male who was seen today for occupational therapy treatment for memory deficits today r/t Parkinsons etc. Patient and spouse provided several options to address functional deficits and impairments as noted with need to review ideas and provide further handouts on strategies as well as HEPs and assist with binder organization. Pt will benefit from further skilled OT services in the outpatient setting to  work on impairments and help pt return to max level of independence.   PERFORMANCE DEFICITS: in functional skills including ADLs, IADLs, coordination, dexterity, proprioception, sensation, tone, ROM, strength, pain, flexibility, Fine motor control, Gross motor control, mobility, balance, body mechanics, endurance, continence, decreased knowledge of precautions, decreased knowledge of use of DME, vision, and UE functional use, cognitive skills including attention, energy/drive, learn, memory, problem solving, safety awareness, thought, and understand, and psychosocial skills including coping strategies, environmental adaptation, and routines and behaviors.   IMPAIRMENTS: are limiting patient from ADLs, IADLs, and leisure.   COMORBIDITIES:  has co-morbidities such as glaucoma, history of back and foot surgery, HTN  that affects occupational performance. Patient will benefit from skilled OT to address above impairments and improve overall function.  REHAB POTENTIAL: Good   PLAN:  OT FREQUENCY: 2x/week  OT DURATION: 6 weeks  PLANNED INTERVENTIONS: 97168 OT Re-evaluation, 97535 self care/ADL training, 84696 therapeutic exercise, 97530 therapeutic activity, 97112 neuromuscular re-education, [phone] aquatic therapy, balance training, functional mobility training, visual/perceptual remediation/compensation, psychosocial skills training, energy conservation, coping strategies training, patient/family education, and DME and/or AE instructions  RECOMMENDED OTHER SERVICES:   CONSULTED AND AGREED WITH PLAN OF CARE: Patient and family member/caregiver  PLAN FOR NEXT SESSION:  Pwr! Moves - GS  Visual comp strategies (.visualcompensatorystrategies)  - review phone modifications  Begin/progress HEP training - strength, coordination and vision WARM memory strategies (needs handout) Memory Note Book - or Notes on phone - Sees ST for eval on 4/15 ADL  comp strategies   Victorino Sparrow, OT 10/14/2023, 2:55  PM

## 2023-10-15 ENCOUNTER — Other Ambulatory Visit (HOSPITAL_COMMUNITY): Payer: Self-pay

## 2023-10-15 ENCOUNTER — Other Ambulatory Visit: Payer: Self-pay

## 2023-10-15 ENCOUNTER — Other Ambulatory Visit (HOSPITAL_BASED_OUTPATIENT_CLINIC_OR_DEPARTMENT_OTHER): Payer: Self-pay

## 2023-10-18 ENCOUNTER — Other Ambulatory Visit (HOSPITAL_COMMUNITY): Payer: Self-pay

## 2023-10-18 ENCOUNTER — Other Ambulatory Visit: Payer: Self-pay

## 2023-10-19 ENCOUNTER — Ambulatory Visit: Admitting: Physical Therapy

## 2023-10-19 ENCOUNTER — Other Ambulatory Visit: Payer: Self-pay

## 2023-10-19 ENCOUNTER — Encounter: Payer: Self-pay | Admitting: Physical Therapy

## 2023-10-19 ENCOUNTER — Encounter: Payer: Self-pay | Admitting: Family Medicine

## 2023-10-19 ENCOUNTER — Ambulatory Visit: Admitting: Occupational Therapy

## 2023-10-19 ENCOUNTER — Ambulatory Visit: Payer: Medicare Other

## 2023-10-19 VITALS — BP 111/73 | HR 71

## 2023-10-19 DIAGNOSIS — M6281 Muscle weakness (generalized): Secondary | ICD-10-CM | POA: Diagnosis not present

## 2023-10-19 DIAGNOSIS — R29818 Other symptoms and signs involving the nervous system: Secondary | ICD-10-CM

## 2023-10-19 DIAGNOSIS — Z0000 Encounter for general adult medical examination without abnormal findings: Secondary | ICD-10-CM | POA: Diagnosis not present

## 2023-10-19 DIAGNOSIS — R2689 Other abnormalities of gait and mobility: Secondary | ICD-10-CM

## 2023-10-19 DIAGNOSIS — R251 Tremor, unspecified: Secondary | ICD-10-CM | POA: Diagnosis not present

## 2023-10-19 DIAGNOSIS — R2681 Unsteadiness on feet: Secondary | ICD-10-CM

## 2023-10-19 DIAGNOSIS — R278 Other lack of coordination: Secondary | ICD-10-CM

## 2023-10-19 DIAGNOSIS — R41840 Attention and concentration deficit: Secondary | ICD-10-CM

## 2023-10-19 DIAGNOSIS — I10 Essential (primary) hypertension: Secondary | ICD-10-CM

## 2023-10-19 DIAGNOSIS — R41842 Visuospatial deficit: Secondary | ICD-10-CM

## 2023-10-19 NOTE — Therapy (Signed)
 OUTPATIENT PHYSICAL THERAPY NEURO TREATMENT   Patient Name: [name] MRN: [id number] DOB:1951/04/22, 73 y.o., male Today's Date: 10/19/2023   PCP: Ronnald Nian, MD REFERRING PROVIDER: Ihor Austin, NP  END OF SESSION:  PT End of Session - 10/19/23 1322     Visit Number 5    Number of Visits 13    Date for PT Re-Evaluation 11/16/23    Authorization Type [employer] Medicare    PT Start Time 1322    PT Stop Time 1400    PT Time Calculation (min) 38 min    Equipment Utilized During Treatment Gait belt    Activity Tolerance Patient tolerated treatment well    Behavior During Therapy WFL for tasks assessed/performed             Past Medical History:  Diagnosis Date   Allergy    seasonal   Arthritis    BPH (benign prostatic hyperplasia)    Complication of anesthesia    Pt. stated he had a reaction that ended in him requiring urinary cath placement; hx delirium worsening parkinson symptoms   Diverticulosis    GERD (gastroesophageal reflux disease)    esophageal spasms   Glaucoma    Gout    Head injury, closed, with concussion    Hepatitis C    chronic - Has been treated with Harvoni   HLD (hyperlipidemia)    statin intolerant (Crestor & Simvastatin) - Taking Livalo 1mg  / week   Hypertension    Parkinson's disease (HCC)    Plantar fasciitis    right   PVD (peripheral vascular disease) (HCC)    With no claudication; only mild abdominal aortic atherosclerosis noted on ultrasound.   Spinal stenosis of lumbar region    Thoracic ascending aortic aneurysm (HCC)    4.2 cm ascending TAA 09/2016 CT, 1 yr f/u rec   Ulcer    Past Surgical History:  Procedure Laterality Date   BUNIONECTOMY WITH WEIL OSTEOTOMY Right 11/02/2019   Procedure: Right Foot Lapidus, Modified McBride Bunionectomy,  Hallux Akin Osteotomy;  Surgeon: Toni Arthurs, MD;  Location: [facility];  Service: Orthopedics;  Laterality: Right;   CARDIAC CATHETERIZATION  2005   30% Cx. Dr.  Elsie Lincoln   cataract surgery Left 11/05/2014   COLONOSCOPY     COLONOSCOPY N/A 01/09/2021   Procedure: COLONOSCOPY;  Surgeon: Kerin Salen, MD;  Location: [facility];  Service: Gastroenterology;  Laterality: N/A;   ESOPHAGOGASTRODUODENOSCOPY N/A 05/02/2015   Procedure: ESOPHAGOGASTRODUODENOSCOPY (EGD);  Surgeon: Bernette Redbird, MD;  Location: [facility];  Service: Endoscopy;  Laterality: N/A;   ESOPHAGOGASTRODUODENOSCOPY  05/02/2015   no source of pt chest pain endoscopically evident. small hiatal hernia.   ESOPHAGOGASTRODUODENOSCOPY (EGD) WITH PROPOFOL N/A 01/02/2021   Procedure: ESOPHAGOGASTRODUODENOSCOPY (EGD) WITH PROPOFOL;  Surgeon: Vida Rigger, MD;  Location: [facility];  Service: Endoscopy;  Laterality: N/A;   EYE SURGERY     HARDWARE REMOVAL Right 11/02/2019   Procedure: Second Metatarsal Removal of Deep Implant and Rotational Osteotomy;  Surgeon: Toni Arthurs, MD;  Location: [facility];  Service: Orthopedics;  Laterality: Right;   IR ANGIOGRAM SELECTIVE EACH ADDITIONAL VESSEL  01/04/2021   IR ANGIOGRAM SELECTIVE EACH ADDITIONAL VESSEL  01/04/2021   IR ANGIOGRAM VISCERAL SELECTIVE  01/04/2021   IR US GUIDE VASC ACCESS RIGHT  01/04/2021   LUMBAR LAMINECTOMY/DECOMPRESSION MICRODISCECTOMY N/A 07/03/2021   Procedure: Lumbar three through five decompression with lumbar three through five insitu fusion;  Surgeon: Venita Lick, MD;  Location: [facility];  Service: Orthopedics;  Laterality: N/A;   MEMBRANE PEEL Left 03/14/2014   Procedure: MEMBRANE PEEL; ENDOLASER;  Surgeon: Edmon Crape, MD;  Location: [facility];  Service: Ophthalmology;  Laterality: Left;   NM MYOVIEW LTD  10/2015   LOW RISK. Small, fixed basal lateral defect - likely diaphragmatic attenuation. 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  No R WMA.  GRII DD-moderately dilated LA..  Mildly dilated RV but normal function.  Normal RAP/CVP.  Trivial AI with mild to moderate sclerosis-no stenosis   UMBILICAL HERNIA REPAIR  2023   UPPER GASTROINTESTINAL ENDOSCOPY     WEIL OSTEOTOMY Right 09/01/2017   Procedure: RIGHT GREAT TOE CHEVRON AND WEIL OSTEOTOMY 2ND METATARSAL;  Surgeon: Nadara Mustard, MD;  Location: [facility];  Service: Orthopedics;  Laterality: Right;   XI ROBOTIC ASSISTED VENTRAL HERNIA N/A 03/17/2022   Procedure: XI ROBOTIC ASSISTED VENTRAL HERNIA;  Surgeon: Henrene Dodge, MD;  Location: [facility];  Service: General;  Laterality: N/A;   Patient Active Problem List   Diagnosis Date Noted   Ventral hernia without obstruction or gangrene    Primary open angle glaucoma of left eye, mild stage 06/23/2021   Facial numbness 12/24/2020   Neovascular glaucoma, right eye, stage unspecified 12/16/2020   Lumbar radiculopathy 11/25/2020   Gastroesophageal reflux disease 09/10/2020   Dry eyes, bilateral 08/15/2020   Presbycusis of right ear 01/17/2020   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   Secondary corneal edema of right eye 12/12/2019   Ptosis of right eyelid 12/12/2019   OAB (overactive bladder) 11/29/2019   Bunion of great toe of right foot 07/14/2017   Claw toe, acquired, right 07/14/2017   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   Medication management 10/09/2016   Chest pain at rest 07/04/2015   Plantar fasciitis of right foot 02/19/2015   Depression 08/14/2014   Family history of heart disease in male family member before age 105 04/26/2014   Mild cognitive impairment 03/13/2014   Parkinsonian tremor (HCC) 02/12/2014   Hyperlipidemia with target LDL less than 100    Abdominal aortic atherosclerosis (HCC)    Moderate essential hypertension  02/25/2011   Arthropathy 02/25/2011   HAV (hallux abducto valgus) 02/25/2011    ONSET DATE: 09/08/2023 (referral)   REFERRING DIAG: G20.C (ICD-10-CM) - Primary parkinsonism (HCC)  THERAPY DIAG:  Other abnormalities of gait and mobility  Unsteadiness on feet  Muscle weakness (generalized)  Rationale for Evaluation and Treatment: Rehabilitation  SUBJECTIVE:  SUBJECTIVE STATEMENT: Patient arrives to session without AD. Patient reports that he did have a few near falls since last here when he was trying to get up; he would get up and get unsteady and bump against a wall to steady himself. Patient reports that his wife told him that she thinks it is because he doesn't have his feet positioned correctly when he tries to get up. Patient does have a cane but isn't really walking it very frequently.   Pt accompanied by: self and  Wife, Britta Mccreedy  PERTINENT HISTORY:  tremor (2014), parkinsonism, allergies, arthritis, reflux disease, glaucoma, gout, hepatitis C, hyperlipidemia, hypertension, plantar fasciitis, peripheral vascular disease, and BPH, admitted 12/8 for L3-5 spinal fusion   PAIN:  Are you having pain? Yes: NPRS scale: 6/10 Pain location: back pain Pain description: achy Aggravating factors: unknown Relieving factors: moving  PRECAUTIONS: Fall and Other: blind in R eye  RED FLAGS: None   WEIGHT BEARING RESTRICTIONS: No  FALLS: Has patient fallen in last 6 months? No and reports frequent near misses   LIVING ENVIRONMENT: Lives with: lives with their spouse Lives in: House/apartment Stairs: Yes: Internal: 2 flights steps; on left going up and External: 1 in front and 3 in side steps; none Has following equipment at home: Single point cane, Walker - 2 wheeled, shower chair, and Grab  bars  PLOF: Needs assistance with ADLs and Needs assistance with homemaking  PATIENT GOALS: "Just wanting to have as much control as I can"   OBJECTIVE:  Note: Objective measures were completed at Evaluation unless otherwise noted.  DIAGNOSTIC FINDINGS: CT of R foot from 2021    IMPRESSION: 1. Interval surgical arthrodesis at the 1st tarsometatarsal articulation. There is no solid osseous ankylosis across the joint, and there is intraosseous cyst formation dorsally. 2. Interval placement of a cortical screw in the medial base of the 1st proximal phalanx without hardware loosening or associated osseous abnormality. 3. Stable deformity of the 1st metatarsal head and neck consistent with previous osteotomy. Stable chronic Freiberg infraction and postsurgical changes in the 2nd metatarsal head. 4. Stable additional mild degenerative changes. No acute findings or significant joint effusions.  COGNITION: Overall cognitive status: Impaired   SENSATION: Pt reports occasional numbness/tingling in BLEs    MUSCLE TONE: LLE: Rigidity   POSTURE: rounded shoulders, forward head, increased thoracic kyphosis, flexed trunk , and weight shift left  LOWER EXTREMITY ROM:     Active  Right Eval Left Eval  Hip flexion    Hip extension    Hip abduction    Hip adduction    Hip internal rotation    Hip external rotation    Knee flexion    Knee extension    Ankle dorsiflexion    Ankle plantarflexion    Ankle inversion    Ankle eversion     (Blank rows = not tested)  LOWER EXTREMITY MMT:    MMT Right Eval Left Eval  Hip flexion    Hip extension    Hip abduction    Hip adduction    Hip internal rotation    Hip external rotation    Knee flexion    Knee extension    Ankle dorsiflexion    Ankle plantarflexion    Ankle inversion    Ankle eversion    (Blank rows = not tested)  BED MOBILITY:  Pt reports independence w/this but it is difficult due to elevated height of the  bed and foot pain   TRANSFERS: Assistive device  utilized: None  Sit to stand: SBA Stand to sit: SBA Chair to chair: SBA Pt demonstrates laterally instability to R side, wide BOS, decreased eccentric control   GAIT: Gait pattern: step through pattern, decreased arm swing- Left, decreased stride length, lateral hip instability, decreased trunk rotation, trunk flexed, and wide BOS Distance walked: Various clinic distances  Assistive device utilized: None Level of assistance: SBA and CGA Comments: Pt maintains L hand in fist. Wide BOS w/decreased lateral weight shift. Pt lost balance posteriorly w/turn, requiring CGA for safety   VITALS  Vitals:   10/19/23 1340 10/19/23 1341 10/19/23 1342 10/19/23 1348  BP: 139/73 108/62 104/61 111/73  Pulse: 79 79 80 71     Start    SciFit Seated  Standing  Seated with OT trade off                                                                                                         TREATMENT:   Self-care/home management  Assessed BP (see above) and initially WNL for therapy, dropped following SciFit and increased with sitting, patient symptomatic in standing and does self-regulate with prolonged standing, consistent with orthostatic hypotension, PT educates patient on BP management and recommends follow up with PCP, PT also calls patient spouse during session to recommend trial of compression socks and follow up with PCP     Ther Act  Nustep x 8 minutes on level 6 and twin-hill setting for large amplitude reciprocal movements of UE/LE, pain management, neural priming, gentle strengthening, and truncal mobility, target SPM > 80   RPE following activity: 7/10 Back pain reduced form 6/10 to 4/10 Upon standing up from bike, patient reports feeling a little dizzy, see self-care session for details   Gait:  GAIT: Gait pattern: step through pattern, decreased arm swing- Left, decreased stride length, lateral hip instability, decreased trunk rotation,  trunk flexed, and wide BOS Distance walked: Various clinic distances (shortened and in between transition from nustep, walking into clinic, and transfer to OT Assistive device utilized: None and brief trial of SPC Level of assistance: SBA and CGA Comments: Pt maintains L hand in fist. Wide BOS w/decreased lateral weight shift, a few episodes of scissor steps, with cane difficulty sequencing   Hand off to OT who made aware of low BP, recommended seated session, patient spouse to pick patient up  PATIENT EDUCATION: Education details: BP safety + continue HEP Person educated: Patient and Spouse Education method: Explanation, Demonstration, Verbal cues, and Handouts Education comprehension: verbalized understanding, returned demonstration, verbal cues required, and needs further education  HOME EXERCISE PROGRAM: Access Code: [email or web address] URL: [email or web address] Date: 10/05/2023 Prepared by: Alethia Berthold Plaster  Exercises - Seated Windmill Trunk Rotation Stretch  - 1 x daily - 7 x weekly - 3 sets - 10 reps - Standing Reach to Opposite Side with Weight Shift  - 1 x daily - 7 x weekly - 3 sets - 10 reps  *PWR! Moves discontinued as too cognitively challenging for patient at this time  GOALS: Goals reviewed with patient? Yes  SHORT TERM  GOALS: Target date: 10/26/2023   Pt will be independent with initial HEP for improved strength, balance, transfers and gait.  Baseline: not established on eval  Goal status: INITIAL  2.  MiniBest to be assessed and LTG updated  Baseline:  Goal status: INITIAL  3.  Pt and wife will verbalize and demonstrate freezing strategies to implement at home and in community for reduced fall risk  Baseline:  Goal status: INITIAL  4.  MCTSIB to be assessed and LTG updated Baseline:  Goal status: MET    LONG TERM GOALS: Target date: 11/09/2023   Pt will verbalize understanding of local PD community resources, including fitness post DC.    Baseline:  Goal status: INITIAL  2. Pt will improve MiniBest to 23/28 for decreased fall risk and improvement with compensatory stepping strategies.   Baseline: 19/28 Goal status: REVISED   3.  Pt will improve 5 x STS to less than or equal to 15 seconds w/o UE support and proper body mechanics to demonstrate improved functional strength and transfer efficiency.   Baseline: 19.55s no UE support, weight shift to R Goal status: INITIAL  4.  Pt will perform floor transfer w/SBA for improved fall recovery and safety  Baseline:  Goal status: INITIAL  5.  MCTSIB goal Baseline: 120/120 Goal status: DC due to high baseline score    ASSESSMENT:  CLINICAL IMPRESSION: Emphasis of skilled PT session on brief trial of gait training and nu-step work; however, limited due to symptoms consistent with orthostatic hypotension. Patient spouse reports patient had BP medications adjusted recently which may explain fluctuations. Recommend follow up with PCP for management and close monitoring with trial of compression socks patient already has at home. Recommend further AD trial and work on transfers next session as able. Continue POC as able.   OBJECTIVE IMPAIRMENTS: Abnormal gait, decreased activity tolerance, decreased balance, decreased cognition, decreased coordination, decreased knowledge of condition, decreased knowledge of use of DME, decreased mobility, difficulty walking, decreased strength, decreased safety awareness, impaired sensation, impaired UE functional use, impaired vision/preception, improper body mechanics, and pain.   ACTIVITY LIMITATIONS: carrying, lifting, bending, standing, squatting, stairs, transfers, bed mobility, bathing, dressing, hygiene/grooming, and locomotion level  PARTICIPATION LIMITATIONS: meal prep, cleaning, laundry, medication management, driving, shopping, community activity, and yard work  PERSONAL FACTORS: Age, Fitness, Past/current experiences, and 1-2  comorbidities: PD, blindness in R eye  are also affecting patient's functional outcome.   REHAB POTENTIAL: Fair due to impaired vision, PD  CLINICAL DECISION MAKING: Evolving/moderate complexity  EVALUATION COMPLEXITY: Moderate  PLAN:  PT FREQUENCY: 1-2x/week  PT DURATION: 6 weeks  PLANNED INTERVENTIONS: 28413- PT Re-evaluation, 97110-Therapeutic exercises, 97530- Therapeutic activity, 97112- Neuromuscular re-education, 97535- Self Care, 24401- Manual therapy, [phone]- Gait training, [phone]- Orthotic Fit/training, [phone]- Canalith repositioning, [phone]- Aquatic Therapy, [phone]- Electrical stimulation (manual), Patient/Family education, Balance training, Stair training, Dry Needling, Joint mobilization, Vestibular training, and DME instructions  PLAN FOR NEXT SESSION:  Add to HEP to work on turns, truncal mobility and compensation strategies for impaired peripheral vision, lateral weight shift to L, posterior stepping strategy and balance reactions when looking over shoulder   Trial of AD possibly rollator versus cane versus 2WW, monitor BP for drops and did patient wear compression socks, could trial floor transfer as tolerated, steadiness when coming up to stand   Carmelia Bake, PT, DPT 10/19/2023, 4:07 PM

## 2023-10-19 NOTE — Progress Notes (Signed)
 Subjective:   [name] is a 73 y.o. who presents for a Medicare Wellness preventive visit.  Visit Complete: Virtual I connected with  [name] on 10/19/23 by a audio enabled telemedicine application and verified that I am speaking with the correct person using two identifiers.  Patient Location: Home  Provider Location: Office/Clinic  I discussed the limitations of evaluation and management by telemedicine. The patient expressed understanding and agreed to proceed.  Vital Signs: Because this visit was a virtual/telehealth visit, some criteria may be missing or patient reported. Any vitals not documented were not able to be obtained and vitals that have been documented are patient reported.  VideoError- Librarian, academic were attempted between this provider and patient, however failed, due to patient having technical difficulties OR patient did not have access to video capability.  We continued and completed visit with audio only.   Persons Participating in Visit: Patient assisted by spouse.  AWV Questionnaire: No: Patient Medicare AWV questionnaire was not completed prior to this visit.  Cardiac Risk Factors include: advanced age (>36men, >45 women);dyslipidemia;hypertension;male gender     Objective:    Today's Vitals   10/19/23 1113  PainSc: 6    There is no height or weight on file to calculate BMI.     10/19/2023   11:32 AM 09/28/2023    3:29 PM 09/28/2023    2:51 PM 10/13/2022   11:53 AM 03/17/2022    6:35 AM 02/17/2022    3:29 PM 10/09/2021   11:19 AM  Advanced Directives  Does Patient Have a Medical Advance Directive? Yes Yes Yes Yes Yes Yes Yes  Type of Estate agent of [place];Living will Healthcare Power of [place];Living will Healthcare Power of [place];Living will Healthcare Power of [place];Living will Healthcare Power of [place];Living will Healthcare Power of [place];Living will Healthcare Power  of Attorney  Does patient want to make changes to medical advance directive?      No - Patient declined Yes (ED - Information included in AVS)  Copy of Healthcare Power of Attorney in Chart? Yes - validated most recent copy scanned in chart (See row information)   Yes - validated most recent copy scanned in chart (See row information) No - copy requested No - copy requested Yes - validated most recent copy scanned in chart (See row information)    Current Medications (verified) Outpatient Encounter Medications as of 10/19/2023  Medication Sig   acetaminophen (TYLENOL) 500 MG tablet Take 2 tablets (1,000 mg total) by mouth every 6 (six) hours as needed for mild pain.   acetaminophen-codeine (TYLENOL #3) 300-30 MG tablet Take 1 tablet by mouth every 8 (eight) hours as needed for moderate pain (pain score 4-6).   alfuzosin (UROXATRAL) 10 MG 24 hr tablet Take 1 tablet (10 mg total) by mouth daily.   atorvastatin (LIPITOR) 20 MG tablet Take 1 tablet (20 mg total) by mouth daily.   carbidopa-levodopa (SINEMET IR) 25-100 MG tablet Take 1.5 tablets by mouth 3 (three) times daily.   citalopram (CELEXA) 20 MG tablet Take 1 tablet (20 mg total) by mouth at bedtime.   dorzolamide-timolol (COSOPT) 2-0.5 % ophthalmic solution Place 1 drop into both eyes 2 (two) times daily.   FIBER ADULT GUMMIES PO Take 1 tablet by mouth 2 (two) times daily.   ibuprofen (ADVIL) 600 MG tablet Take 1 tablet (600 mg total) by mouth every 8 (eight) hours as needed for moderate pain.   irbesartan (AVAPRO) 75 MG  tablet Take 1 tablet (75 mg total) by mouth every morning.   Multiple Vitamins-Minerals (MULTIVITAMIN ADULTS 50+ PO) Take 1 tablet by mouth daily.   Netarsudil-Latanoprost (ROCKLATAN) 0.02-0.005 % SOLN Place 1 drop into the left eye every night at bedtime.   nitroGLYCERIN (NITROSTAT) 0.4 MG SL tablet Place 1 tablet (0.4 mg total) under the tongue every 5 (five) minutes as needed for chest pain.   pantoprazole (PROTONIX) 40  MG tablet Take 1 tablet (40 mg total) by mouth 2 (two) times daily.   trihexyphenidyl (ARTANE) 2 MG tablet Take 0.5 tablets (1 mg total) by mouth 2 (two) times daily.   verapamil (CALAN-SR) 120 MG CR tablet Take 1 tablet (120 mg total) by mouth every morning.   Vibegron (GEMTESA) 75 MG TABS Take 1 tablet (75 mg total) by mouth daily.   alfuzosin (UROXATRAL) 10 MG 24 hr tablet Take 1 tablet (10 mg total) by mouth at bedtime. (Patient not taking: Reported on 10/19/2023)   dorzolamide-timolol (COSOPT) 22.3-6.8 MG/ML ophthalmic solution Instill 1 drop into both eyes twice a day (Patient not taking: Reported on 10/19/2023)   ibuprofen (ADVIL) 600 MG tablet Take 1 tablet (600 mg total) by mouth every 8 (eight) hours as needed for moderate pain. (Patient not taking: Reported on 10/19/2023)   ibuprofen (ADVIL) 600 MG tablet Take 1 tablet (600 mg total) by mouth 3 (three) times daily as needed. (Patient not taking: Reported on 10/19/2023)   influenza vaccine adjuvanted (FLUAD) 0.5 ML injection Inject into the muscle. (Patient not taking: Reported on 10/19/2023)   mirabegron ER (MYRBETRIQ) 50 MG TB24 tablet Take 1 tablet (50 mg total) by mouth daily. (Patient not taking: Reported on 10/19/2023)   MYRBETRIQ 50 MG TB24 tablet Take 1 tablet (50 mg total) by mouth daily. (Patient not taking: Reported on 10/19/2023)   Netarsudil Dimesylate (RHOPRESSA) 0.02 % SOLN Place 1 drop into the left eye every evening. (Patient not taking: Reported on 10/19/2023)   Netarsudil-Latanoprost (ROCKLATAN) 0.02-0.005 % SOLN Place 1 drop into the left eye every evening. (Patient not taking: Reported on 10/19/2023)   rivastigmine (EXELON) 4.5 MG capsule Take 1 capsule (4.5 mg total) by mouth 2 (two) times daily. (Patient not taking: Reported on 10/19/2023)   No facility-administered encounter medications on file as of 10/19/2023.    Allergies (verified) Anesthesia [id number] [propofol], Aspirin, and Other   History: Past Medical History:   Diagnosis Date   Allergy    seasonal   Arthritis    BPH (benign prostatic hyperplasia)    Complication of anesthesia    Pt. stated he had a reaction that ended in him requiring urinary cath placement; hx delirium worsening parkinson symptoms   Diverticulosis    GERD (gastroesophageal reflux disease)    esophageal spasms   Glaucoma    Gout    Head injury, closed, with concussion    Hepatitis C    chronic - Has been treated with Harvoni   HLD (hyperlipidemia)    statin intolerant (Crestor & Simvastatin) - Taking Livalo 1mg  / week   Hypertension    Parkinson's disease (HCC)    Plantar fasciitis    right   PVD (peripheral vascular disease) (HCC)    With no claudication; only mild abdominal aortic atherosclerosis noted on ultrasound.   Spinal stenosis of lumbar region    Thoracic ascending aortic aneurysm (HCC)    4.2 cm ascending TAA 09/2016 CT, 1 yr f/u rec   Ulcer    Past Surgical History:  Procedure  Laterality Date   BUNIONECTOMY WITH WEIL OSTEOTOMY Right 11/02/2019   Procedure: Right Foot Lapidus, Modified McBride Bunionectomy,  Hallux Akin Osteotomy;  Surgeon: Toni Arthurs, MD;  Location: [facility];  Service: Orthopedics;  Laterality: Right;   CARDIAC CATHETERIZATION  2005   30% Cx. Dr. Elsie Lincoln   cataract surgery Left 11/05/2014   COLONOSCOPY     COLONOSCOPY N/A 01/09/2021   Procedure: COLONOSCOPY;  Surgeon: Kerin Salen, MD;  Location: [facility];  Service: Gastroenterology;  Laterality: N/A;   ESOPHAGOGASTRODUODENOSCOPY N/A 05/02/2015   Procedure: ESOPHAGOGASTRODUODENOSCOPY (EGD);  Surgeon: Bernette Redbird, MD;  Location: [facility];  Service: Endoscopy;  Laterality: N/A;   ESOPHAGOGASTRODUODENOSCOPY  05/02/2015   no source of pt chest pain endoscopically evident. small hiatal hernia.   ESOPHAGOGASTRODUODENOSCOPY (EGD) WITH PROPOFOL N/A 01/02/2021   Procedure: ESOPHAGOGASTRODUODENOSCOPY (EGD) WITH PROPOFOL;  Surgeon: Vida Rigger, MD;  Location: [facility];  Service: Endoscopy;  Laterality: N/A;   EYE SURGERY     HARDWARE REMOVAL Right 11/02/2019   Procedure: Second Metatarsal Removal of Deep Implant and Rotational Osteotomy;  Surgeon: Toni Arthurs, MD;  Location: [facility];  Service: Orthopedics;  Laterality: Right;   IR ANGIOGRAM SELECTIVE EACH ADDITIONAL VESSEL  01/04/2021   IR ANGIOGRAM SELECTIVE EACH ADDITIONAL VESSEL  01/04/2021   IR ANGIOGRAM VISCERAL SELECTIVE  01/04/2021   IR US GUIDE VASC ACCESS RIGHT  01/04/2021   LUMBAR LAMINECTOMY/DECOMPRESSION MICRODISCECTOMY N/A 07/03/2021   Procedure: Lumbar three through five decompression with lumbar three through five insitu fusion;  Surgeon: Venita Lick, MD;  Location[facility];  Service: Orthopedics;  Laterality: N/A;   MEMBRANE PEEL Left 03/14/2014   Procedure: MEMBRANE PEEL; ENDOLASER;  Surgeon: Edmon Crape, MD;  Location: [facility] OR;  Service: Ophthalmology;  Laterality: Left;   NM MYOVIEW LTD  10/2015   LOW RISK. Small, fixed basal lateral defect - likely diaphragmatic attenuation. 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  No R WMA.  GRII DD-moderately dilated LA..  Mildly dilated RV but normal function.  Normal RAP/CVP.  Trivial AI with mild to moderate sclerosis-no stenosis   UMBILICAL HERNIA REPAIR  2023   UPPER GASTROINTESTINAL ENDOSCOPY     WEIL OSTEOTOMY Right 09/01/2017   Procedure: RIGHT GREAT TOE CHEVRON AND WEIL OSTEOTOMY 2ND METATARSAL;  Surgeon: Nadara Mustard, MD;  Location: [facility] OR;  Service: Orthopedics;  Laterality: Right;   XI ROBOTIC ASSISTED VENTRAL HERNIA N/A 03/17/2022   Procedure: XI ROBOTIC ASSISTED VENTRAL HERNIA;  Surgeon: Henrene Dodge, MD;  Location: [facility]S;  Service:  General;  Laterality: N/A;   Family History  Problem Relation Age of Onset   Heart disease Mother    Cancer Sister    Cancer Paternal Grandfather    Sleep apnea Neg Hx    Social History   Socioeconomic History   Marital status: Married    Spouse name: barbra gen   Number of children: 3   Years of education: AS   Highest education level: Not on file  Occupational History   Occupation: self employed    Employer: [employer]    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  Tobacco Use   Smoking status: Former    Types:  Cigars    Quit date: 07/27/1988    Years since quitting: 35.2   Smokeless tobacco: Never  Vaping Use   Vaping status: Never Used  Substance and Sexual Activity   Alcohol use: Not Currently    Alcohol/week: 3.0 standard drinks of alcohol    Types: 1 Glasses of wine, 1 Cans of beer, 1 Shots of liquor per week    Comment: occasional   Drug use: Yes    Types: Codeine   Sexual activity: Yes  Other Topics Concern   Not on file  Social History Narrative   Lives with wife    Right handed   Drinks 1-2 cups caffeine daily   Social Drivers of Health   Financial Resource Strain: Low Risk  (10/19/2023)   Overall Financial Resource Strain (CARDIA)    Difficulty of Paying Living Expenses: Not hard at all  Food Insecurity: No Food Insecurity (10/19/2023)   Hunger Vital Sign    Worried About Running Out of Food in the Last Year: Never true    Ran Out of Food in the Last Year: Never true  Transportation Needs: No Transportation Needs (10/19/2023)   PRAPARE - Administrator, Civil Service (Medical): No    Lack of Transportation (Non-Medical): No  Physical Activity: Sufficiently Active (10/19/2023)   Exercise Vital Sign    Days of Exercise per Week: 3 days    Minutes of Exercise per Session: 50 min  Stress: No Stress Concern Present (10/19/2023)   [employer] of Occupational Health - Occupational Stress Questionnaire    Feeling of Stress : Only a little  Social  Connections: Moderately Isolated (10/19/2023)   Social Connection and Isolation Panel [NHANES]    Frequency of Communication with Friends and Family: Three times a week    Frequency of Social Gatherings with Friends and Family: More than three times a week    Attends Religious Services: Never    Database administrator or Organizations: No    Attends Engineer, structural: Never    Marital Status: Married    Tobacco Counseling Counseling given: Not Answered    Clinical Intake:  Pre-visit preparation completed: Yes  Pain : 0-10 Pain Score: 6  Pain Type: Chronic pain Pain Location: Back Pain Orientation: Lower Pain Descriptors / Indicators: Other (Comment) (stiffness) Pain Onset: More than a month ago Pain Frequency: Constant     Nutritional Risks: Non-healing wound Diabetes: No  No results found for: "HGBA1C"   How often do you need to have someone help you when you read instructions, pamphlets, or other written materials from your doctor or pharmacy?: 1 - Never  Interpreter Needed?: No  Information entered by :: NAllen LPN   Activities of Daily Living     10/19/2023   11:16 AM  In your present state of health, do you have any difficulty performing the following activities:  Hearing? 1  Comment working on getting hearing aids  Vision? 1  Comment macular degeneration  Difficulty concentrating or making decisions? 1  Walking or climbing stairs? 1  Dressing or bathing? 1  Doing errands, shopping? 1  Preparing Food and eating ? N  Using the Toilet? N  In the past six months, have you accidently leaked urine? N  Do you have problems with loss of bowel control? N  Managing your Medications? Y  Comment wife manages  Managing your Finances? N  Housekeeping or managing your Housekeeping? Y    Patient Care Team: Sharlot Gowda  C, MD as PCP - General (Family Medicine) Marykay Lex, MD as PCP - Cardiology (Cardiology) Sherrill Raring, [facility]  (Pharmacist)  Indicate any recent Medical Services you may have received from other than [facility] providers in the past year (date may be approximate).     Assessment:   This is a routine wellness examination for [name].  Hearing/Vision screen Hearing Screening - Comments:: Working on getting hearing aids Vision Screening - Comments:: Regular eye exams, [facility]   Goals Addressed             This Visit's Progress    Patient Stated       10/19/2023, wants to be more healthy getting back to more working out       Depression Screen     10/19/2023   11:33 AM 10/13/2022   11:54 AM 10/09/2021   11:21 AM 09/10/2020    9:26 AM 03/20/2020    2:46 PM 01/17/2020   10:00 AM 12/06/2018   11:14 AM  PHQ 2/9 Scores  PHQ - 2 Score 0 0 1 0 0 1 2  PHQ- 9 Score 4 0     7    Fall Risk     10/19/2023   11:33 AM 10/13/2022   11:54 AM 04/09/2022    1:54 PM 04/08/2022    2:42 PM 10/09/2021   11:20 AM  Fall Risk   Falls in the past year? 0 0 0 0 1  Number falls in past yr: 0 0   0  Injury with Fall? 0 0   0  Risk for fall due to : Medication side effect;Impaired balance/gait Impaired balance/gait;Medication side effect;Impaired mobility   No Fall Risks  Follow up Falls prevention discussed;Falls evaluation completed Falls prevention discussed;Education provided;Falls evaluation completed   Falls evaluation completed    MEDICARE RISK AT HOME:  Medicare Risk at Home Any stairs in or around the home?: Yes If so, are there any without handrails?: No Home free of loose throw rugs in walkways, pet beds, electrical cords, etc?: Yes Adequate lighting in your home to reduce risk of falls?: Yes Life alert?: No Use of a cane, walker or w/c?: No Grab bars in the bathroom?: Yes Shower chair or bench in shower?: Yes Elevated toilet seat or a handicapped toilet?: Yes  TIMED UP AND GO:  Was the test performed?  No  Cognitive Function: 6CIT completed    04/05/2023    4:14 PM 10/12/2022   10:59  AM 10/06/2021    2:34 PM 04/07/2021    2:38 PM 08/24/2018    9:25 AM  MMSE - Mini Mental State Exam  Orientation to time 5 4 5 5 5   Orientation to Place 5 5 5 5 5   Registration 3 3 3 3 3   Attention/ Calculation 5 1 5 5 5   Recall 3 2 2 1 2   Language- name 2 objects 2 1 2 2 2   Language- repeat 1 1 1 1 1   Language- follow 3 step command 3 3 1 2 3   Language- read & follow direction 1 1 1 1 1   Write a sentence 1 1 1 1 1   Write a sentence-comments     no subject  Copy design 1 1 1  0 1  Total score 30 23 27 26 29         10/19/2023   11:35 AM 10/13/2022   11:56 AM  6CIT Screen  What Year? 0 points 0 points  What month? 0  points 0 points  What time? 0 points 0 points  Count back from 20 0 points 0 points  Months in reverse 0 points 0 points  Repeat phrase 4 points 0 points  Total Score 4 points 0 points    Immunizations Immunization History  Administered Date(s) Administered   DTaP 09/10/1997, 08/11/2007   Fluad Quad(high Dose 65+) 05/17/2019, 05/01/2021, 04/06/2022   Fluad Trivalent(High Dose 65+) 04/29/2023   Influenza Split 05/09/2012, 06/09/2015, 07/11/2015   Influenza Whole 07/10/2004, 05/25/2006   Influenza, High Dose Seasonal PF 05/05/2017, 04/15/2018   Influenza,inj,Quad PF,6+ Mos 04/28/2013, 04/26/2014, 05/31/2016   Influenza-Unspecified 05/25/2016   Moderna SARS-COV2 Booster Vaccination 11/07/2020   Moderna Sars-Covid-2 Vaccination 08/20/2019, 09/17/2019, 05/22/2020   PFIZER Comirnaty(Gray Top)Covid-19 Tri-Sucrose Vaccine 05/11/2022   Pfizer Covid-19 Vaccine Bivalent Booster 55yrs & up 05/01/2021   Pfizer(Comirnaty)Fall Seasonal Vaccine 12 years and older 10/28/2022, 04/29/2023   Pneumococcal Conjugate-13 09/07/2010   Pneumococcal Polysaccharide-23 07/16/2017   Tdap 09/05/2011, 10/09/2021   Zoster Recombinant(Shingrix) 01/17/2020, 03/20/2020    Screening Tests Health Maintenance  Topic Date Due   COVID-19 Vaccine (8 - Moderna risk 2024-25 season) 10/28/2023    Medicare Annual Wellness (AWV)  10/18/2024   Colonoscopy  01/10/2031   DTaP/Tdap/Td (5 - Td or Tdap) 10/10/2031   Pneumonia Vaccine 67+ Years old  Completed   INFLUENZA VACCINE  Completed   Hepatitis C Screening  Completed   Zoster Vaccines- Shingrix  Completed   HPV VACCINES  Aged Out    Health Maintenance  There are no preventive care reminders to display for this patient. Health Maintenance Items Addressed: Up to date  Additional Screening:  Vision Screening: Recommended annual ophthalmology exams for early detection of glaucoma and other disorders of the eye.  Dental Screening: Recommended annual dental exams for proper oral hygiene  Community Resource Referral / Chronic Care Management: CRR required this visit?  Yes   CCM required this visit?  No     Plan:     I have personally reviewed and noted the following in the patient's chart:   Medical and social history Use of alcohol, tobacco or illicit drugs  Current medications and supplements including opioid prescriptions. Patient is currently taking opioid prescriptions. Information provided to patient regarding non-opioid alternatives. Patient advised to discuss non-opioid treatment plan with their provider. Functional ability and status Nutritional status Physical activity Advanced directives List of other physicians Hospitalizations, surgeries, and ER visits in previous 12 months Vitals Screenings to include cognitive, depression, and falls Referrals and appointments  In addition, I have reviewed and discussed with patient certain preventive protocols, quality metrics, and best practice recommendations. A written personalized care plan for preventive services as well as general preventive health recommendations were provided to patient.     Barb Merino, LPN   1/61/0960   After Visit Summary: (MyChart) Due to this being a telephonic visit, the after visit summary with patients personalized plan was  offered to patient via MyChart   Notes: Nothing significant to report at this time.

## 2023-10-19 NOTE — Patient Instructions (Addendum)
 Visual Compensatory Strategies  1. Look for the edge of objects (to the left and/or right) so that you make sure you are seeing all of an object 2. Turn your head when walking, scan from side to side, particularly in busy environments 3. Use an organized scanning pattern. It's usually easier to scan from top to bottom, and left to right (like you are reading) 4. Double check yourself 5. Use a line guide (like a blank piece of paper) or your finger when reading 6. If necessary, place brightly colored tape at end of table or work area as a reminder to always look until you see the tape.

## 2023-10-19 NOTE — Patient Instructions (Signed)
 [name] , Thank you for taking time to come for your Medicare Wellness Visit. I appreciate your ongoing commitment to your health goals. Please review the following plan we discussed and let me know if I can assist you in the future.   Referrals/Orders/Follow-Ups/Clinician Recommendations: none  This is a list of the screening recommended for you and due dates:  Health Maintenance  Topic Date Due   COVID-19 Vaccine (8 - Moderna risk 2024-25 season) 10/28/2023   Medicare Annual Wellness Visit  10/18/2024   Colon Cancer Screening  01/10/2031   DTaP/Tdap/Td vaccine (5 - Td or Tdap) 10/10/2031   Pneumonia Vaccine  Completed   Flu Shot  Completed   Hepatitis C Screening  Completed   Zoster (Shingles) Vaccine  Completed   HPV Vaccine  Aged Out    Advanced directives: (In Chart) A copy of your advanced directives are scanned into your chart should your provider ever need it.  Next Medicare Annual Wellness Visit scheduled for next year: Yes  insert Preventive Care attachment Insert FALL PREVENTION attachment if needed

## 2023-10-19 NOTE — Therapy (Signed)
 OUTPATIENT OCCUPATIONAL THERAPY PARKINSON'S TREATMENT  Patient Name: [name] MRN: [id number] DOB:1951/07/08, 73 y.o., male Today's Date: 10/19/2023  PCP: Ronnald Nian, MD REFERRING PROVIDER: Ihor Austin, NP  END OF SESSION:  OT End of Session - 10/19/23 1348     Visit Number 5    Number of Visits 13   including eval/tx   Date for OT Re-Evaluation 11/24/23    Authorization Type [employer] Medicare 2025    Authorization Time Period Auth# [id number] 09/28/23 to 11/09/23 Parkinsons [employer] Medicare 2025    Authorization - Number of Visits 13    Progress Note Due on Visit 10    OT Start Time 1400    OT Stop Time 1453    OT Time Calculation (min) 53 min    Equipment Utilized During Treatment Visual scanning items    Activity Tolerance Patient tolerated treatment well    Behavior During Therapy WFL for tasks assessed/performed             Past Medical History:  Diagnosis Date   Allergy    seasonal   Arthritis    BPH (benign prostatic hyperplasia)    Complication of anesthesia    Pt. stated he had a reaction that ended in him requiring urinary cath placement; hx delirium worsening parkinson symptoms   Diverticulosis    GERD (gastroesophageal reflux disease)    esophageal spasms   Glaucoma    Gout    Head injury, closed, with concussion    Hepatitis C    chronic - Has been treated with Harvoni   HLD (hyperlipidemia)    statin intolerant (Crestor & Simvastatin) - Taking Livalo 1mg  / week   Hypertension    Parkinson's disease (HCC)    Plantar fasciitis    right   PVD (peripheral vascular disease) (HCC)    With no claudication; only mild abdominal aortic atherosclerosis noted on ultrasound.   Spinal stenosis of lumbar region    Thoracic ascending aortic aneurysm (HCC)    4.2 cm ascending TAA 09/2016 CT, 1 yr f/u rec   Ulcer    Past Surgical History:  Procedure Laterality Date   BUNIONECTOMY WITH WEIL OSTEOTOMY Right 11/02/2019   Procedure: Right Foot Lapidus, Modified  McBride Bunionectomy,  Hallux Akin Osteotomy;  Surgeon: Toni Arthurs, MD;  Location: [facility];  Service: Orthopedics;  Laterality: Right;   CARDIAC CATHETERIZATION  2005   30% Cx. Dr. Elsie Lincoln   cataract surgery Left 11/05/2014   COLONOSCOPY     COLONOSCOPY N/A 01/09/2021   Procedure: COLONOSCOPY;  Surgeon: Kerin Salen, MD;  Location: [facility];  Service: Gastroenterology;  Laterality: N/A;   ESOPHAGOGASTRODUODENOSCOPY N/A 05/02/2015   Procedure: ESOPHAGOGASTRODUODENOSCOPY (EGD);  Surgeon: Bernette Redbird, MD;  Location: [facility];  Service: Endoscopy;  Laterality: N/A;   ESOPHAGOGASTRODUODENOSCOPY  05/02/2015   no source of pt chest pain endoscopically evident. small hiatal hernia.   ESOPHAGOGASTRODUODENOSCOPY (EGD) WITH PROPOFOL N/A 01/02/2021   Procedure: ESOPHAGOGASTRODUODENOSCOPY (EGD) WITH PROPOFOL;  Surgeon: Vida Rigger, MD;  Location: [facility];  Service: Endoscopy;  Laterality: N/A;   EYE SURGERY     HARDWARE REMOVAL Right 11/02/2019   Procedure: Second Metatarsal Removal of Deep Implant and Rotational Osteotomy;  Surgeon: Toni Arthurs, MD;  Location: [facility];  Service: Orthopedics;  Laterality: Right;   IR ANGIOGRAM SELECTIVE EACH ADDITIONAL VESSEL  01/04/2021   IR ANGIOGRAM SELECTIVE EACH ADDITIONAL VESSEL  01/04/2021   IR ANGIOGRAM VISCERAL SELECTIVE  01/04/2021   IR US GUIDE  VASC ACCESS RIGHT  01/04/2021   LUMBAR LAMINECTOMY/DECOMPRESSION MICRODISCECTOMY N/A 07/03/2021   Procedure: Lumbar three through five decompression with lumbar three through five insitu fusion;  Surgeon: Venita Lick, MD;  Location: [facility] OR;  Service: Orthopedics;  Laterality: N/A;   MEMBRANE PEEL Left 03/14/2014   Procedure: MEMBRANE PEEL; ENDOLASER;  Surgeon: Edmon Crape, MD;  Location: [facility] OR;  Service: Ophthalmology;  Laterality: Left;   NM MYOVIEW LTD  10/2015   LOW RISK. Small, fixed basal lateral defect - likely diaphragmatic attenuation. 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  No R WMA.  GRII DD-moderately dilated LA..  Mildly dilated RV but normal function.  Normal RAP/CVP.  Trivial AI with mild to moderate sclerosis-no stenosis   UMBILICAL HERNIA REPAIR  2023   UPPER GASTROINTESTINAL ENDOSCOPY     WEIL OSTEOTOMY Right 09/01/2017   Procedure: RIGHT GREAT TOE CHEVRON AND WEIL OSTEOTOMY 2ND METATARSAL;  Surgeon: Nadara Mustard, MD;  Location: [facility] OR;  Service: Orthopedics;  Laterality: Right;   XI ROBOTIC ASSISTED VENTRAL HERNIA N/A 03/17/2022   Procedure: XI ROBOTIC ASSISTED VENTRAL HERNIA;  Surgeon: Henrene Dodge, MD;  Location: [facility]S;  Service: General;  Laterality: N/A;   Patient Active Problem List   Diagnosis Date Noted   Ventral hernia without obstruction or gangrene    Primary open angle glaucoma of left eye, mild stage 06/23/2021   Facial numbness 12/24/2020   Neovascular glaucoma, right eye, stage unspecified 12/16/2020   Lumbar radiculopathy 11/25/2020   Gastroesophageal reflux disease 09/10/2020   Dry eyes, bilateral 08/15/2020   Presbycusis of right ear 01/17/2020   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   Secondary corneal edema of right eye 12/12/2019   Ptosis of right eyelid 12/12/2019   OAB (overactive bladder) 11/29/2019   Bunion of great toe of right foot 07/14/2017   Claw toe, acquired, right 07/14/2017   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   Medication management 10/09/2016   Chest pain at rest 07/04/2015   Plantar fasciitis of right foot 02/19/2015   Depression 08/14/2014   Family history of heart disease in male family member before age 3 04/26/2014   Mild cognitive  impairment 03/13/2014   Parkinsonian tremor (HCC) 02/12/2014   Hyperlipidemia with target LDL less than 100    Abdominal aortic atherosclerosis (HCC)    Moderate essential hypertension 02/25/2011   Arthropathy 02/25/2011   HAV (hallux abducto valgus) 02/25/2011   ONSET DATE: 09/20/2023  REFERRING DIAG: G20.C (ICD-10-CM) - Parkinsonism, unspecified  THERAPY DIAG:  Visuospatial deficit  Other lack of coordination  Other symptoms and signs involving the nervous system  Attention and concentration deficit  Rationale for Evaluation and Treatment: Rehabilitation  SUBJECTIVE:   SUBJECTIVE STATEMENT:  Pt accompanied by: self and significant other - [name] ("Bee") - waited in the waiting room  Seen after PT today with some BP issues.  Pt/spouse report he had started a new medicine and PT noted his BP was a bit low when standing and walking with but was up to 111/73 once seated for OT.    Pt reports he has been referencing his Memory Binder once  in awhile at home but that he had to have his wife help him find something yesterday that was eventually found on the counter.    PERTINENT HISTORY: Tremor (2015), parkinsonism, allergies, arthritis, reflux disease, glaucoma, gout, hepatitis C, hyperlipidemia, hypertension, plantar fasciitis, peripheral vascular disease, and BPH, admitted 07/03/2021 for L3-5 spinal fusion Back, foot, hernia, neuro surgery (pains)  PRECAUTIONS: Fall and Other: Vision loss R eye  WEIGHT BEARING RESTRICTIONS: No  PAIN: Pain in back  Are you having pain? Yes: NPRS scale: 6/10 Pain location: Lower back (every day) and some down right leg Pain description: If I get up - can be sharp pains, sometimes aching  Aggravating factors: Moving, bending, lifting Relieving factors: Ibuprofen, acetaminophen, resting  FALLS: Has patient fallen in last 6 months? No - near misses - ie) getting out of the chair  LIVING ENVIRONMENT: Lives with: lives with their spouse Lives  in: House/apartment Stairs: Yes: Internal: 14+ steps; on left going up and External: 2-3 steps; can reach both Has following equipment at home: Single point cane, Walker - 2 wheeled, shower chair, Grab bars, and elevated toilet seat, walk in shower with removable seat - not using it at this time  PLOF: Independent with basic ADLs, Independent with community mobility without device, Needs assistance with homemaking, and Not driving  PATIENT GOALS: Pt reported that he wanted to be able to improve memory/recall, regain/improve balance ie) to minimize getting frozen, keep up/improve strength ie) to open doors for wife, to be able to take a container off the shelf, get the bottles of water from the car etc.  OBJECTIVE:  Note: Objective measures were completed at Evaluation unless otherwise noted.  HAND DOMINANCE: Right  ADLs: Overall ADLs: Occasional support from spouse Transfers/ambulation related to ADLs: Ind/Mod I Eating: Pt can cut some foods himself but may get help Grooming: Pt shaves himself - uses regular razor UB Dressing: Ind - may get help to pull his jacket off LB Dressing: Ind Toileting: Ind - uses regular undergarments, but has used pull-ups at night but is not using them at this time Bathing: Showers a couple of times/week and stands in the shower despite having a shower stool available Tub Shower transfers: Mod I - has grab bars Equipment: Shower seat without back, Grab bars, Walk in shower, and Reacher  IADLs: Not driving due to vision trouble - ie) limited peripheral vision Shopping: Goes with spouse Light housekeeping: Pt reports helping to wash dishes Meal Prep: Helps spouse but is slower getting things done.  He likes to cook breakfast. Community mobility: Ind without AE Medication management: Spouse helps take care of meds Financial management: Spouse does this  Handwriting: 50-75% legible - By pt report "it sucks."  Pt had improved legibility with printing verus  writing.  MOBILITY STATUS: Independent and remote h/o falls  POSTURE COMMENTS:  rounded shoulders, forward head, increased thoracic kyphosis, flexed trunk , and weight shift left   ACTIVITY TOLERANCE: Activity tolerance: Good  FUNCTIONAL OUTCOME MEASURES: Fastening/unfastening 3 buttons: 46.24 sec Physical performance test: PPT#2 (simulated eating) 16.96 & PPT#4 (donning/doffing jacket): 28.58 sec  COORDINATION: 9 Hole Peg test: Right: 31.54 sec; Left: 34.65 sec Box and Blocks:  Right 38 blocks, Left 38 blocks  UE ROM:  WFL - slight end range limitations  UE MMT:   WFL - 4-/5 Grip Strength:  Right: 53.1, 56.4, 55.7 - Average 55.1 lbs Left:  42.7, 51.3, 48.2 - Average 47.4 lbs  SENSATION: Pt reports his fingertips get cold and "  turn blue", (not thumbs)   MUSCLE TONE: WFL  COGNITION: Overall cognitive status:  slower processing by report ie) gets frozen and asks for frequent feedback from spouse for memory  PD OBSERVATIONS: Dyskinesias, Postural tremors, and pt and spouse report some freezing   VISION: Pt has history of glaucoma and some eye injections with loss of peripheral vision, R eye limitations and confirms that he has used magnify glass.  Pt has Iphone.                                                                                                                    TODAY'S TREATMENT:   Reviewed and practiced visual compensatory strategies as noted in pt instructions including:   1. Look for the edge of objects (to the left and/or right) so that you make sure you are seeing all of an object - pt did this well with matching playing cards on the table top ie) scanning from extremes of cards laid out by OT to R and L side of table.  2. Turn your head when walking, scan from side to side, particularly in busy environments - Pt performed this with colored cards in the hallway locating 12 upon initial walk down the hall and then 6 upon return up the hall including objects  being placed low, high and slightly hidden around items in the hall (picture, hand sanitizer etc).  3. Use an organized scanning pattern (top to bottom, and left to right) - pt reported difficulty finding something at the grocery store the other day and is encouraged to use this technique as well as to scan a table or counter top if spouse reports location of object to be found.  4. Double check yourself  5. Use a line guide (like a blank piece of paper) or your finger when reading - OT made a colored line guide from orange tape on a stiff piece of cardboard and provided it to patient to read printing material (ie. Pt instructions provided today).  6. If necessary, place brightly colored tape at end of table or work area as a reminder to always look until you see the tape.     PATIENT EDUCATION: Education details: Production manager Person educated: Patient and Spouse Education method: Programmer, multimedia, Facilities manager, Verbal cues, and Handouts Education comprehension: verbalized understanding, returned demonstration, verbal cues required, and needs further education  HOME EXERCISE PROGRAM: TBD  GOALS: Goals reviewed with patient? Yes GOALS:  SHORT TERM GOALS: Target date: 10/22/23  Pt will be independent with PD specific HEP. Baseline: not yet initiated Goal status: IN Progress  2.  Pt will independently recall and demonstrate at least 3 compensatory strategies/adaptations for visual impairment/memory aide without cueing including technology options with IPhone  Baseline: New to outpt OT - h/o glaucoma, has used magnify glass Goal status: IN Progress  3.  Pt will improve confidence with using his own strength ie) to open doors for wife, to be able to take a container off the shelf, and  carry items from the car  Baseline: UE strength: 4-/5; Grip: Right: 55.1 lbs Left: 47.4 lbs Goal status: INITIAL  4.  Pt will write a grocery list x 10 words with no significant decrease in  size and maintain 75% legibility. Baseline: 50-75% legibility Goal status: IN Progress  5.  Pt will demonstrate fine motor coordination for ADLs as evidenced by no decline and/or improvement in 9 hole peg test score and Box and Blocks Test for BUE. Baseline: 9 Hole: Right: 31.54 sec; Left: 34.65 sec  Box and Blocks: Right 38 blocks, Left 38 blocks Goal status: IN Progress   LONG TERM GOALS: Target date: 12/21/2023    Pt will verbalize understanding of ways to prevent future PD related complications and PD community resources. Baseline: not yet initiated Goal status: IN Progress  2.  Pt will verbalize understanding of ways to keep thinking skills sharp/WARM strategies and ways to compensate for STM changes now and in the future, including use of Memory Notebook. Baseline: not yet initiated Goal status: IN Progress  3.  Pt will verbalize understanding of adapted strategies to maximize safety, independence and timeliness with ADLs/IADLs. Baseline: Physical performance test: PPT#2 (simulated eating) 16.96 & PPT#4 (donning/doffing jacket): 28.58 sec Goal status: IN Progress  4.  Pt will demonstrate improved FM skills for max ease with fastening buttons as evidenced by decreasing 3 button/unbutton time by 5+ seconds Baseline: 46.24 secs Goal status: INITIAL    ASSESSMENT:  CLINICAL IMPRESSION: Patient is a 73 y.o. male who was seen today for occupational therapy treatment for memory deficits today r/t Parkinsons etc. Patient and spouse provided several options to address functional deficits and impairments as noted with need to review ideas and provide further handouts on strategies as well as HEPs and assist with binder organization. Pt will benefit from further skilled OT services in the outpatient setting to work on impairments and help pt return to max level of independence.   PERFORMANCE DEFICITS: in functional skills including ADLs, IADLs, coordination, dexterity, proprioception,  sensation, tone, ROM, strength, pain, flexibility, Fine motor control, Gross motor control, mobility, balance, body mechanics, endurance, continence, decreased knowledge of precautions, decreased knowledge of use of DME, vision, and UE functional use, cognitive skills including attention, energy/drive, learn, memory, problem solving, safety awareness, thought, and understand, and psychosocial skills including coping strategies, environmental adaptation, and routines and behaviors.   IMPAIRMENTS: are limiting patient from ADLs, IADLs, and leisure.   COMORBIDITIES:  has co-morbidities such as glaucoma, history of back and foot surgery, HTN  that affects occupational performance. Patient will benefit from skilled OT to address above impairments and improve overall function.  REHAB POTENTIAL: Good   PLAN:  OT FREQUENCY: 2x/week  OT DURATION: 6 weeks  PLANNED INTERVENTIONS: 97168 OT Re-evaluation, 97535 self care/ADL training, 19147 therapeutic exercise, 97530 therapeutic activity, 97112 neuromuscular re-education, [phone] aquatic therapy, balance training, functional mobility training, visual/perceptual remediation/compensation, psychosocial skills training, energy conservation, coping strategies training, patient/family education, and DME and/or AE instructions  RECOMMENDED OTHER SERVICES:   CONSULTED AND AGREED WITH PLAN OF CARE: Patient and family member/caregiver  PLAN FOR NEXT SESSION:  Pwr! Moves - GS  Review Visual comp strategies provided 3/25 and add vision HEP  - review phone modifications  Begin/progress HEP training - strength, coordination to increase confidence with using his own strength WARM memory strategies (potentially needs handout) - provide handouts in page protectors Memory Note Book - or Notes on phone - Sees ST for eval on 4/15 ADL comp strategies  Victorino Sparrow, OT 10/19/2023, 3:06 PM

## 2023-10-20 ENCOUNTER — Encounter: Payer: Self-pay | Admitting: Adult Health

## 2023-10-21 ENCOUNTER — Encounter: Admitting: Occupational Therapy

## 2023-10-21 ENCOUNTER — Telehealth: Payer: Self-pay | Admitting: *Deleted

## 2023-10-21 ENCOUNTER — Ambulatory Visit: Admitting: Physical Therapy

## 2023-10-21 ENCOUNTER — Other Ambulatory Visit (HOSPITAL_COMMUNITY): Payer: Self-pay

## 2023-10-21 ENCOUNTER — Other Ambulatory Visit: Payer: Self-pay

## 2023-10-21 NOTE — Progress Notes (Signed)
 Complex Care Management Note Care Guide Note  10/21/2023 Name: [name] MRN: [id number] DOB: 1950-12-20   Complex Care Management Outreach Attempts: An unsuccessful telephone outreach was attempted today to offer the patient information about available complex care management services.  Follow Up Plan:  Additional outreach attempts will be made to offer the patient complex care management information and services.   Encounter Outcome:  No Answer  Gwenevere Ghazi  [facility]  [facility], [facility] Guide  Direct Dial: [phone]  Fax [phone]

## 2023-10-22 ENCOUNTER — Telehealth: Payer: Self-pay | Admitting: *Deleted

## 2023-10-22 ENCOUNTER — Other Ambulatory Visit (HOSPITAL_COMMUNITY): Payer: Self-pay

## 2023-10-22 MED ORDER — AMOXICILLIN 500 MG PO CAPS
500.0000 mg | ORAL_CAPSULE | Freq: Four times a day (QID) | ORAL | 0 refills | Status: DC
  Filled 2023-10-22: qty 28, 4d supply, fill #0
  Filled 2023-10-22: qty 28, 7d supply, fill #0

## 2023-10-22 NOTE — Progress Notes (Signed)
 Complex Care Management Note Care Guide Note  10/22/2023 Name: [name] MRN: [id number] DOB: [birth year]   Complex Care Management Outreach Attempts: A second unsuccessful outreach was attempted today to offer the patient with information about available complex care management services.  Follow Up Plan:  Additional outreach attempts will be made to offer the patient complex care management information and services.   Encounter Outcome:  No Answer  Gwenevere Ghazi  [facility]  [facility], [facility] Guide  Direct Dial: [phone]  Fax [phone]

## 2023-10-25 ENCOUNTER — Other Ambulatory Visit (HOSPITAL_COMMUNITY): Payer: Self-pay

## 2023-10-26 ENCOUNTER — Encounter: Admitting: Occupational Therapy

## 2023-10-26 ENCOUNTER — Ambulatory Visit: Admitting: Physical Therapy

## 2023-10-27 ENCOUNTER — Encounter: Payer: Self-pay | Admitting: Physical Therapy

## 2023-10-27 ENCOUNTER — Ambulatory Visit: Attending: Adult Health | Admitting: Physical Therapy

## 2023-10-27 ENCOUNTER — Other Ambulatory Visit: Payer: Self-pay

## 2023-10-27 ENCOUNTER — Other Ambulatory Visit (HOSPITAL_COMMUNITY): Payer: Self-pay

## 2023-10-27 ENCOUNTER — Ambulatory Visit: Admitting: Occupational Therapy

## 2023-10-27 VITALS — BP 117/68 | HR 62

## 2023-10-27 DIAGNOSIS — M6281 Muscle weakness (generalized): Secondary | ICD-10-CM

## 2023-10-27 DIAGNOSIS — R29818 Other symptoms and signs involving the nervous system: Secondary | ICD-10-CM

## 2023-10-27 DIAGNOSIS — R2689 Other abnormalities of gait and mobility: Secondary | ICD-10-CM

## 2023-10-27 DIAGNOSIS — R471 Dysarthria and anarthria: Secondary | ICD-10-CM | POA: Insufficient documentation

## 2023-10-27 DIAGNOSIS — R2681 Unsteadiness on feet: Secondary | ICD-10-CM | POA: Diagnosis not present

## 2023-10-27 DIAGNOSIS — R41842 Visuospatial deficit: Secondary | ICD-10-CM | POA: Insufficient documentation

## 2023-10-27 DIAGNOSIS — R41840 Attention and concentration deficit: Secondary | ICD-10-CM | POA: Insufficient documentation

## 2023-10-27 DIAGNOSIS — R251 Tremor, unspecified: Secondary | ICD-10-CM | POA: Diagnosis not present

## 2023-10-27 DIAGNOSIS — R41841 Cognitive communication deficit: Secondary | ICD-10-CM | POA: Insufficient documentation

## 2023-10-27 DIAGNOSIS — R41844 Frontal lobe and executive function deficit: Secondary | ICD-10-CM | POA: Insufficient documentation

## 2023-10-27 DIAGNOSIS — R278 Other lack of coordination: Secondary | ICD-10-CM

## 2023-10-27 DIAGNOSIS — F801 Expressive language disorder: Secondary | ICD-10-CM | POA: Diagnosis not present

## 2023-10-27 NOTE — Progress Notes (Signed)
 Complex Care Management Note  Care Guide Note 10/27/2023 Name: JAEVIN MEDEARIS MRN: [id number] DOB: 1951-04-13  YANG RACK is a 73 y.o. year old male who sees Ronnald Nian, MD for primary care. I reached out to [name] by phone today to offer complex care management services.  Mr. Danowski was given information about Complex Care Management services today including:   The Complex Care Management services include support from the care team which includes your Nurse Care Manager, Clinical Social Worker, or Pharmacist.  The Complex Care Management team is here to help remove barriers to the health concerns and goals most important to you. Complex Care Management services are voluntary, and the patient may decline or stop services at any time by request to their care team member.   Complex Care Management Consent Status: Patient agreed to services and verbal consent obtained.   Follow up plan:  Telephone appointment with complex care management team member scheduled for:  4/9  Encounter Outcome:  Patient Scheduled  Gwenevere Ghazi  [facility]  [facility], [facility] Guide  Direct Dial: [phone]  Fax [phone]

## 2023-10-27 NOTE — Therapy (Signed)
 OUTPATIENT PHYSICAL THERAPY NEURO TREATMENT   Patient Name: [name] MRN: [id number] DOB:Sep 12, 1950, 73 y.o., male Today's Date: 10/27/2023   PCP: Ronnald Nian, MD REFERRING PROVIDER: Ihor Austin, NP  END OF SESSION:  PT End of Session - 10/27/23 1402     Visit Number 6    Number of Visits 13    Date for PT Re-Evaluation 11/16/23    Authorization Type [employer] Medicare    PT Start Time 1401    PT Stop Time 1442    PT Time Calculation (min) 41 min    Equipment Utilized During Treatment Gait belt    Activity Tolerance Patient tolerated treatment well    Behavior During Therapy WFL for tasks assessed/performed             Past Medical History:  Diagnosis Date   Allergy    seasonal   Arthritis    BPH (benign prostatic hyperplasia)    Complication of anesthesia    Pt. stated he had a reaction that ended in him requiring urinary cath placement; hx delirium worsening parkinson symptoms   Diverticulosis    GERD (gastroesophageal reflux disease)    esophageal spasms   Glaucoma    Gout    Head injury, closed, with concussion    Hepatitis C    chronic - Has been treated with Harvoni   HLD (hyperlipidemia)    statin intolerant (Crestor & Simvastatin) - Taking Livalo 1mg  / week   Hypertension    Parkinson's disease (HCC)    Plantar fasciitis    right   PVD (peripheral vascular disease) (HCC)    With no claudication; only mild abdominal aortic atherosclerosis noted on ultrasound.   Spinal stenosis of lumbar region    Thoracic ascending aortic aneurysm (HCC)    4.2 cm ascending TAA 09/2016 CT, 1 yr f/u rec   Ulcer    Past Surgical History:  Procedure Laterality Date   BUNIONECTOMY WITH WEIL OSTEOTOMY Right 11/02/2019   Procedure: Right Foot Lapidus, Modified McBride Bunionectomy,  Hallux Akin Osteotomy;  Surgeon: Toni Arthurs, MD;  Location: [facility];  Service: Orthopedics;  Laterality: Right;   CARDIAC CATHETERIZATION  2005   30% Cx. Dr.  Elsie Lincoln   cataract surgery Left 11/05/2014   COLONOSCOPY     COLONOSCOPY N/A 01/09/2021   Procedure: COLONOSCOPY;  Surgeon: Kerin Salen, MD;  Location: [facility];  Service: Gastroenterology;  Laterality: N/A;   ESOPHAGOGASTRODUODENOSCOPY N/A 05/02/2015   Procedure: ESOPHAGOGASTRODUODENOSCOPY (EGD);  Surgeon: Bernette Redbird, MD;  Location: [facility];  Service: Endoscopy;  Laterality: N/A;   ESOPHAGOGASTRODUODENOSCOPY  05/02/2015   no source of pt chest pain endoscopically evident. small hiatal hernia.   ESOPHAGOGASTRODUODENOSCOPY (EGD) WITH PROPOFOL N/A 01/02/2021   Procedure: ESOPHAGOGASTRODUODENOSCOPY (EGD) WITH PROPOFOL;  Surgeon: Vida Rigger, MD;  Location: [facility];  Service: Endoscopy;  Laterality: N/A;   EYE SURGERY     HARDWARE REMOVAL Right 11/02/2019   Procedure: Second Metatarsal Removal of Deep Implant and Rotational Osteotomy;  Surgeon: Toni Arthurs, MD;  Location: [facility];  Service: Orthopedics;  Laterality: Right;   IR ANGIOGRAM SELECTIVE EACH ADDITIONAL VESSEL  01/04/2021   IR ANGIOGRAM SELECTIVE EACH ADDITIONAL VESSEL  01/04/2021   IR ANGIOGRAM VISCERAL SELECTIVE  01/04/2021   IR US GUIDE VASC ACCESS RIGHT  01/04/2021   LUMBAR LAMINECTOMY/DECOMPRESSION MICRODISCECTOMY N/A 07/03/2021   Procedure: Lumbar three through five decompression with lumbar three through five insitu fusion;  Surgeon: Venita Lick, MD;  Location: [facility];  Service: Orthopedics;  Laterality: N/A;   MEMBRANE PEEL Left 03/14/2014   Procedure: MEMBRANE PEEL; ENDOLASER;  Surgeon: Edmon Crape, MD;  Location: [facility];  Service: Ophthalmology;  Laterality: Left;   NM MYOVIEW LTD  10/2015   LOW RISK. Small, fixed basal lateral defect - likely diaphragmatic attenuation. 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  No R WMA.  GRII DD-moderately dilated LA..  Mildly dilated RV but normal function.  Normal RAP/CVP.  Trivial AI with mild to moderate sclerosis-no stenosis   UMBILICAL HERNIA REPAIR  2023   UPPER GASTROINTESTINAL ENDOSCOPY     WEIL OSTEOTOMY Right 09/01/2017   Procedure: RIGHT GREAT TOE CHEVRON AND WEIL OSTEOTOMY 2ND METATARSAL;  Surgeon: Nadara Mustard, MD;  Location: [facility];  Service: Orthopedics;  Laterality: Right;   XI ROBOTIC ASSISTED VENTRAL HERNIA N/A 03/17/2022   Procedure: XI ROBOTIC ASSISTED VENTRAL HERNIA;  Surgeon: Henrene Dodge, MD;  Location: [facility];  Service: General;  Laterality: N/A;   Patient Active Problem List   Diagnosis Date Noted   Ventral hernia without obstruction or gangrene    Primary open angle glaucoma of left eye, mild stage 06/23/2021   Facial numbness 12/24/2020   Neovascular glaucoma, right eye, stage unspecified 12/16/2020   Lumbar radiculopathy 11/25/2020   Gastroesophageal reflux disease 09/10/2020   Dry eyes, bilateral 08/15/2020   Presbycusis of right ear 01/17/2020   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   Secondary corneal edema of right eye 12/12/2019   Ptosis of right eyelid 12/12/2019   OAB (overactive bladder) 11/29/2019   Bunion of great toe of right foot 07/14/2017   Claw toe, acquired, right 07/14/2017   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   Medication management 10/09/2016   Chest pain at rest 07/04/2015   Plantar fasciitis of right foot 02/19/2015   Depression 08/14/2014   Family history of heart disease in male family member before age 65 04/26/2014   Mild cognitive impairment 03/13/2014   Parkinsonian tremor (HCC) 02/12/2014   Hyperlipidemia with target LDL less than 100    Abdominal aortic atherosclerosis (HCC)    Moderate essential hypertension  02/25/2011   Arthropathy 02/25/2011   HAV (hallux abducto valgus) 02/25/2011    ONSET DATE: 09/08/2023 (referral)   REFERRING DIAG: G20.C (ICD-10-CM) - Primary parkinsonism (HCC)  THERAPY DIAG:  Other symptoms and signs involving the nervous system  Other abnormalities of gait and mobility  Unsteadiness on feet  Muscle weakness (generalized)  Rationale for Evaluation and Treatment: Rehabilitation  SUBJECTIVE:  SUBJECTIVE STATEMENT: Pt reports everything has been the same. Have not experienced any low blood pressures that he was aware of. No falls, but had some near misses. Sometimes when he is trying to move, moves too quick or does not get stable before he takes off walking. Wearing his compression socks today. Pt reports mainly feeling his near misses when he first stands up compared to when he is walking.   Pt accompanied by: self and     PERTINENT HISTORY:  tremor (2014), parkinsonism, allergies, arthritis, reflux disease, glaucoma, gout, hepatitis C, hyperlipidemia, hypertension, plantar fasciitis, peripheral vascular disease, and BPH, admitted 12/8 for L3-5 spinal fusion   PAIN:  Are you having pain? Yes: NPRS scale: 2/10 Pain location: back pain Pain description: achy Aggravating factors: unknown Relieving factors: sitting down or resting, taking Tylenol or ibuprofen   PRECAUTIONS: Fall and Other: blind in R eye  RED FLAGS: None   WEIGHT BEARING RESTRICTIONS: No  FALLS: Has patient fallen in last 6 months? No and reports frequent near misses   LIVING ENVIRONMENT: Lives with: lives with their spouse Lives in: House/apartment Stairs: Yes: Internal: 2 flights steps; on left going up and External: 1 in front and 3 in side steps; none Has following equipment at home: Single point  cane, Walker - 2 wheeled, shower chair, and Grab bars  PLOF: Needs assistance with ADLs and Needs assistance with homemaking  PATIENT GOALS: "Just wanting to have as much control as I can"   OBJECTIVE:  Note: Objective measures were completed at Evaluation unless otherwise noted.  DIAGNOSTIC FINDINGS: CT of R foot from 2021    IMPRESSION: 1. Interval surgical arthrodesis at the 1st tarsometatarsal articulation. There is no solid osseous ankylosis across the joint, and there is intraosseous cyst formation dorsally. 2. Interval placement of a cortical screw in the medial base of the 1st proximal phalanx without hardware loosening or associated osseous abnormality. 3. Stable deformity of the 1st metatarsal head and neck consistent with previous osteotomy. Stable chronic Freiberg infraction and postsurgical changes in the 2nd metatarsal head. 4. Stable additional mild degenerative changes. No acute findings or significant joint effusions.  COGNITION: Overall cognitive status: Impaired   SENSATION: Pt reports occasional numbness/tingling in BLEs    MUSCLE TONE: LLE: Rigidity   POSTURE: rounded shoulders, forward head, increased thoracic kyphosis, flexed trunk , and weight shift left  LOWER EXTREMITY ROM:     Active  Right Eval Left Eval  Hip flexion    Hip extension    Hip abduction    Hip adduction    Hip internal rotation    Hip external rotation    Knee flexion    Knee extension    Ankle dorsiflexion    Ankle plantarflexion    Ankle inversion    Ankle eversion     (Blank rows = not tested)  LOWER EXTREMITY MMT:    MMT Right Eval Left Eval  Hip flexion    Hip extension    Hip abduction    Hip adduction    Hip internal rotation    Hip external rotation    Knee flexion    Knee extension    Ankle dorsiflexion    Ankle plantarflexion    Ankle inversion    Ankle eversion    (Blank rows = not tested)  BED MOBILITY:  Pt reports independence w/this but  it is difficult due to elevated height of the bed and foot pain   TRANSFERS: Assistive device utilized: None  Sit to stand: SBA Stand to sit: SBA Chair to chair: SBA Pt demonstrates laterally instability to R side, wide BOS, decreased eccentric control   GAIT: Gait pattern: step through pattern, decreased arm swing- Left, decreased stride length, lateral hip instability, decreased trunk rotation, trunk flexed, and wide BOS Distance walked: Various clinic distances  Assistive device utilized: None Level of assistance: SBA and CGA Comments: Pt maintains L hand in fist. Wide BOS w/decreased lateral weight shift. Pt lost balance posteriorly w/turn, requiring CGA for safety                                                                                                            TREATMENT:   Therapeutic Activity: Vitals:   10/27/23 1407 10/27/23 1408  BP: 135/82 117/68  Pulse: 60 62   Sitting, Standing   Assessed BP and WFL for therapy. Pt's diastolic did drop, indicative of orthostatic hypotension. Pt wearing his compression socks today. Pt asymptomatic with standing during session. Educated to make sure that pt continues to monitor his BP at home in sitting/standing and following up with PCP regarding this. Also educated on making sure pt continues to wear his compression socks, staying well hydrated, taking his time with sit <> stands and getting balance in standing before walking, and can perform seated marching/ankle pumps before walking.  Pt hesitant about using an AD and asking the reasons why he would need one - this PT educating pt on pt's fall risk, pt having some near misses, drops in BP, and visual deficits. Discussed will continue to trial during session to determine potentially the most appropriate AD for pt. Discussed would need more practice with the cane or single trekking pole as pt getting off sequence at times, more so when going any curves/turns  10 reps sit <> stands  from standard chair with cues to scoot out towards the edge, tucking feet back, and nose over toes. Pt did well with transfer technique today   Gait:  GAIT: Gait pattern: step through pattern, decreased arm swing- Left, decreased stride length, lateral hip instability, decreased trunk rotation, trunk flexed, and wide BOS Distance walked: Various clinic distances with no AD  Assistive device utilized: None  Level of assistance: SBA and CGA Comments: Pt maintains L hand in fist  Trialed SPC 230' x 1, pt holding cane in RUE, cued for sequencing, with pt able to maintain sequencing during straightaways, but gets more off sequence when going around turns or curves, needing CGA as needed. Pt also has visual deficits in R eye where pt would be holding the cane   Trailed single trekking pole 115' x 1, pt holding trekking pole in RUE, pt also getting off sequence at times with use of pole. Pt does not note any significant difference with his gait using cane or single trekking pole   Trialed rollator 230' x 1, initial cues for proper brake management for safety, and staying closer to rollator and taking larger steps, pt reported he liked using this one and feeling more steady, but  does not want to use it and is not open to using a walker at this time. Discussed with pt it wouldn't be something he would initially have to use all the time, but could be useful for in the community or longer distances to help with balance. Pt still not fully agreeable about using rollator at this time and asks about prolonging using a rollator.    PATIENT EDUCATION: Education details: See therapeutic activity section above, will continue to practice with AD as appropriate and determine which would potentially be best for pt  Person educated: Patient and Spouse Education method: Explanation, Demonstration, Verbal cues, and Handouts Education comprehension: verbalized understanding, returned demonstration, verbal cues  required, and needs further education  HOME EXERCISE PROGRAM: Access Code: [email or web address] URL: [email or web address] Date: 10/05/2023 Prepared by: Alethia Berthold Plaster  Exercises - Seated Windmill Trunk Rotation Stretch  - 1 x daily - 7 x weekly - 3 sets - 10 reps - Standing Reach to Opposite Side with Weight Shift  - 1 x daily - 7 x weekly - 3 sets - 10 reps  *PWR! Moves discontinued as too cognitively challenging for patient at this time  GOALS: Goals reviewed with patient? Yes  SHORT TERM GOALS: Target date: 10/26/2023   Pt will be independent with initial HEP for improved strength, balance, transfers and gait.  Baseline: not established on eval  Goal status: INITIAL  2.  MiniBest to be assessed and LTG updated  Baseline:  Goal status: MET  3.  Pt and wife will verbalize and demonstrate freezing strategies to implement at home and in community for reduced fall risk  Baseline:  Goal status: INITIAL  4.  MCTSIB to be assessed and LTG updated Baseline:  Goal status: MET    LONG TERM GOALS: Target date: 11/09/2023   Pt will verbalize understanding of local PD community resources, including fitness post DC.   Baseline:  Goal status: INITIAL  2. Pt will improve MiniBest to 23/28 for decreased fall risk and improvement with compensatory stepping strategies.   Baseline: 19/28 Goal status: REVISED   3.  Pt will improve 5 x STS to less than or equal to 15 seconds w/o UE support and proper body mechanics to demonstrate improved functional strength and transfer efficiency.   Baseline: 19.55s no UE support, weight shift to R Goal status: INITIAL  4.  Pt will perform floor transfer w/SBA for improved fall recovery and safety  Baseline:  Goal status: INITIAL  5.  MCTSIB goal Baseline: 120/120 Goal status: DC due to high baseline score    ASSESSMENT:  CLINICAL IMPRESSION: Pt wearing his compression socks to session today. Assessed BP at start of session and pt  did demonstrate orthostatic hypotension, but pt asymptomatic with no reports of lightheadedness when standing and BP still [facility] for therapy. Educated to continue to monitor at home and to follow up with PCP regarding this. Remainder of session focused on trialing different AD to determine best one in order to decr fall risk. Pt getting off sequence at times with [facility] and single trekking pole, so not recommending at this time as pt would need more practice with these. Also tried rollator with pt able to ambulate with supervision and pt reporting it does help him feel more steady, but pt does not want to use a rollator at this time. Will continue to practice and determine most appropriate AD for pt. Will continue per POC.   OBJECTIVE IMPAIRMENTS: Abnormal gait, decreased activity tolerance, decreased balance, decreased cognition,  decreased coordination, decreased knowledge of condition, decreased knowledge of use of DME, decreased mobility, difficulty walking, decreased strength, decreased safety awareness, impaired sensation, impaired UE functional use, impaired vision/preception, improper body mechanics, and pain.   ACTIVITY LIMITATIONS: carrying, lifting, bending, standing, squatting, stairs, transfers, bed mobility, bathing, dressing, hygiene/grooming, and locomotion level  PARTICIPATION LIMITATIONS: meal prep, cleaning, laundry, medication management, driving, shopping, community activity, and yard work  PERSONAL FACTORS: Age, Fitness, Past/current experiences, and 1-2 comorbidities: PD, blindness in R eye  are also affecting patient's functional outcome.   REHAB POTENTIAL: Fair due to impaired vision, PD  CLINICAL DECISION MAKING: Evolving/moderate complexity  EVALUATION COMPLEXITY: Moderate  PLAN:  PT FREQUENCY: 1-2x/week  PT DURATION: 6 weeks  PLANNED INTERVENTIONS: 97164- PT Re-evaluation, 97110-Therapeutic exercises, 97530- Therapeutic activity, [id number]- Neuromuscular re-education, 97535-  Self Care, 16109- Manual therapy, [phone]- Gait training, [phone]- Orthotic Fit/training, [phone]- Canalith repositioning, [phone]- Aquatic Therapy, [phone]- Electrical stimulation (manual), Patient/Family education, Balance training, Stair training, Dry Needling, Joint mobilization, Vestibular training, and DME instructions  PLAN FOR NEXT SESSION:  Add to HEP to work on turns, truncal mobility and compensation strategies for impaired peripheral vision, lateral weight shift to L, posterior stepping strategy and balance reactions when looking over shoulder   Continue to monitor BP. Continue trial of AD? Pt with difficulty with sequencing with cane/trekking pole and pt not amendable to using a rollator at this time could trial floor transfer as tolerated, steadiness when coming up to stand   Drake Leach, PT, DPT 10/27/2023, 3:18 PM

## 2023-10-27 NOTE — Therapy (Unsigned)
 OUTPATIENT OCCUPATIONAL THERAPY PARKINSON'S TREATMENT  Patient Name: [name] MRN: [id number] DOB:1951-05-02, 73 y.o., male Today's Date: 10/27/2023  PCP: Ronnald Nian, MD REFERRING PROVIDER: Ihor Austin, NP  END OF SESSION:  OT End of Session - 10/27/23 1322     Visit Number 6    Number of Visits 13   including eval/tx   Date for OT Re-Evaluation 11/24/23    Authorization Type [employer] 2025    Authorization Time Period Auth# [id number] 09/28/23 to 11/09/23 Parkinsons [employer] 2025    Authorization - Number of Visits 13    Progress Note Due on Visit 10    OT Start Time 1322    OT Stop Time 1400    OT Time Calculation (min) 38 min    Equipment Utilized During Treatment utensils    Activity Tolerance Patient tolerated treatment well    Behavior During Therapy WFL for tasks assessed/performed             Past Medical History:  Diagnosis Date   Allergy    seasonal   Arthritis    BPH (benign prostatic hyperplasia)    Complication of anesthesia    Pt. stated he had a reaction that ended in him requiring urinary cath placement; hx delirium worsening parkinson symptoms   Diverticulosis    GERD (gastroesophageal reflux disease)    esophageal spasms   Glaucoma    Gout    Head injury, closed, with concussion    Hepatitis C    chronic - Has been treated with Harvoni   HLD (hyperlipidemia)    statin intolerant (Crestor & Simvastatin) - Taking Livalo 1mg  / week   Hypertension    Parkinson's disease (HCC)    Plantar fasciitis    right   PVD (peripheral vascular disease) (HCC)    With no claudication; only mild abdominal aortic atherosclerosis noted on ultrasound.   Spinal stenosis of lumbar region    Thoracic ascending aortic aneurysm (HCC)    4.2 cm ascending TAA 09/2016 CT, 1 yr f/u rec   Ulcer    Past Surgical History:  Procedure Laterality Date   BUNIONECTOMY WITH WEIL OSTEOTOMY Right 11/02/2019   Procedure: Right Foot Lapidus, Modified McBride  Bunionectomy,  Hallux Akin Osteotomy;  Surgeon: Toni Arthurs, MD;  Location: [facility];  Service: Orthopedics;  Laterality: Right;   CARDIAC CATHETERIZATION  2005   30% Cx. Dr. Elsie Lincoln   cataract surgery Left 11/05/2014   COLONOSCOPY     COLONOSCOPY N/A 01/09/2021   Procedure: COLONOSCOPY;  Surgeon: Kerin Salen, MD;  Location: [facility];  Service: Gastroenterology;  Laterality: N/A;   ESOPHAGOGASTRODUODENOSCOPY N/A 05/02/2015   Procedure: ESOPHAGOGASTRODUODENOSCOPY (EGD);  Surgeon: Bernette Redbird, MD;  Location: [facility];  Service: Endoscopy;  Laterality: N/A;   ESOPHAGOGASTRODUODENOSCOPY  05/02/2015   no source of pt chest pain endoscopically evident. small hiatal hernia.   ESOPHAGOGASTRODUODENOSCOPY (EGD) WITH PROPOFOL N/A 01/02/2021   Procedure: ESOPHAGOGASTRODUODENOSCOPY (EGD) WITH PROPOFOL;  Surgeon: Vida Rigger, MD;  Location: [facility];  Service: Endoscopy;  Laterality: N/A;   EYE SURGERY     HARDWARE REMOVAL Right 11/02/2019   Procedure: Second Metatarsal Removal of Deep Implant and Rotational Osteotomy;  Surgeon: Toni Arthurs, MD;  Location: [facility];  Service: Orthopedics;  Laterality: Right;   IR ANGIOGRAM SELECTIVE EACH ADDITIONAL VESSEL  01/04/2021   IR ANGIOGRAM SELECTIVE EACH ADDITIONAL VESSEL  01/04/2021   IR ANGIOGRAM VISCERAL SELECTIVE  01/04/2021   IR US GUIDE VASC ACCESS  RIGHT  01/04/2021   LUMBAR LAMINECTOMY/DECOMPRESSION MICRODISCECTOMY N/A 07/03/2021   Procedure: Lumbar three through five decompression with lumbar three through five insitu fusion;  Surgeon: Venita Lick, MD;  Location: [facility] OR;  Service: Orthopedics;  Laterality: N/A;   MEMBRANE PEEL Left 03/14/2014   Procedure: MEMBRANE PEEL; ENDOLASER;  Surgeon: Edmon Crape, MD;  Location: [facility] OR;  Service: Ophthalmology;  Laterality: Left;   NM MYOVIEW LTD  10/2015   LOW RISK. Small, fixed basal lateral defect - likely diaphragmatic attenuation. EF 69%   PARS PLANA  VITRECTOMY Left 03/14/2014   Procedure: PARS PLANA VITRECTOMY WITH 25 GAUGE;  Surgeon: Edmon Crape, MD;  Location: [facility] OR;  Service: Ophthalmology;  Laterality: Left;   shave  02/03/2022   angiofibroma   TONSILLECTOMY     TRANSTHORACIC ECHOCARDIOGRAM  01/07/2021   Normal EF 60 to 65%.  No R WMA.  GRII DD-moderately dilated LA..  Mildly dilated RV but normal function.  Normal RAP/CVP.  Trivial AI with mild to moderate sclerosis-no stenosis   UMBILICAL HERNIA REPAIR  2023   UPPER GASTROINTESTINAL ENDOSCOPY     WEIL OSTEOTOMY Right 09/01/2017   Procedure: RIGHT GREAT TOE CHEVRON AND WEIL OSTEOTOMY 2ND METATARSAL;  Surgeon: Nadara Mustard, MD;  Location: [facility];  Service: Orthopedics;  Laterality: Right;   XI ROBOTIC ASSISTED VENTRAL HERNIA N/A 03/17/2022   Procedure: XI ROBOTIC ASSISTED VENTRAL HERNIA;  Surgeon: Henrene Dodge, MD;  Location: [facility];  Service: General;  Laterality: N/A;   Patient Active Problem List   Diagnosis Date Noted   Ventral hernia without obstruction or gangrene    Primary open angle glaucoma of left eye, mild stage 06/23/2021   Facial numbness 12/24/2020   Neovascular glaucoma, right eye, stage unspecified 12/16/2020   Lumbar radiculopathy 11/25/2020   Gastroesophageal reflux disease 09/10/2020   Dry eyes, bilateral 08/15/2020   Presbycusis of right ear 01/17/2020   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   Secondary corneal edema of right eye 12/12/2019   Ptosis of right eyelid 12/12/2019   OAB (overactive bladder) 11/29/2019   Bunion of great toe of right foot 07/14/2017   Claw toe, acquired, right 07/14/2017   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   Medication management 10/09/2016   Chest pain at rest 07/04/2015   Plantar fasciitis of right foot 02/19/2015   Depression 08/14/2014   Family history of heart disease in male family member before age 41 04/26/2014   Mild cognitive  impairment 03/13/2014   Parkinsonian tremor (HCC) 02/12/2014   Hyperlipidemia with target LDL less than 100    Abdominal aortic atherosclerosis (HCC)    Moderate essential hypertension 02/25/2011   Arthropathy 02/25/2011   HAV (hallux abducto valgus) 02/25/2011   ONSET DATE: 09/20/2023  REFERRING DIAG: G20.C (ICD-10-CM) - Parkinsonism, unspecified  THERAPY DIAG:  Other lack of coordination  Muscle weakness (generalized)  Other symptoms and signs involving the nervous system  Tremor  Rationale for Evaluation and Treatment: Rehabilitation  SUBJECTIVE:   SUBJECTIVE STATEMENT:  Pt accompanied by: self and significant other - Britta Mccreedy ("Bee") - waited in the waiting room  Tried a TENS unit for   MRI in a week or 2 ~ 14th  Pt reports no changes in medication    Seen after PT today with some BP issues.  Pt/spouse report he had started a new medicine and PT noted his BP was a bit low when standing and walking with but  was up to 111/73 once seated for OT.    Pt reports he has been referencing his Memory Binder once in awhile at home but that he had to have his wife help him find something yesterday that was eventually found on the counter.    PERTINENT HISTORY: Tremor (2015), parkinsonism, allergies, arthritis, reflux disease, glaucoma, gout, hepatitis C, hyperlipidemia, hypertension, plantar fasciitis, peripheral vascular disease, and BPH, admitted 07/03/2021 for L3-5 spinal fusion Back, foot, hernia, neuro surgery (pains)  PRECAUTIONS: Fall and Other: Vision loss R eye  WEIGHT BEARING RESTRICTIONS: No  PAIN: Pain in back  Are you having pain? Yes: NPRS scale: at worst 6-7/10 but took ibuprofen this mornging and down to 2-3/10 Pain location: Lower back (every day) and some down right leg Pain description: If I get up - can be sharp pains, sometimes aching  Aggravating factors: Moving, bending, lifting Relieving factors: Ibuprofen, acetaminophen, resting, heating pad  FALLS:  Has patient fallen in last 6 months? No - near misses - ie) getting out of the chair  LIVING ENVIRONMENT: Lives with: lives with their spouse Lives in: House/apartment Stairs: Yes: Internal: 14+ steps; on left going up and External: 2-3 steps; can reach both Has following equipment at home: Single point cane, Walker - 2 wheeled, shower chair, Grab bars, and elevated toilet seat, walk in shower with removable seat - not using it at this time  PLOF: Independent with basic ADLs, Independent with community mobility without device, Needs assistance with homemaking, and Not driving  PATIENT GOALS: Pt reported that he wanted to be able to improve memory/recall, regain/improve balance ie) to minimize getting frozen, keep up/improve strength ie) to open doors for wife, to be able to take a container off the shelf, get the bottles of water from the car etc.  OBJECTIVE:  Note: Objective measures were completed at Evaluation unless otherwise noted.  HAND DOMINANCE: Right  ADLs: Overall ADLs: Occasional support from spouse Transfers/ambulation related to ADLs: Ind/Mod I Eating: Pt can cut some foods himself but may get help Grooming: Pt shaves himself - uses regular razor UB Dressing: Ind - may get help to pull his jacket off LB Dressing: Ind Toileting: Ind - uses regular undergarments, but has used pull-ups at night but is not using them at this time Bathing: Showers a couple of times/week and stands in the shower despite having a shower stool available Tub Shower transfers: Mod I - has grab bars Equipment: Shower seat without back, Grab bars, Walk in shower, and Reacher  IADLs: Not driving due to vision trouble - ie) limited peripheral vision Shopping: Goes with spouse Light housekeeping: Pt reports helping to wash dishes Meal Prep: Helps spouse but is slower getting things done.  He likes to cook breakfast. Community mobility: Ind without AE Medication management: Spouse helps take care of  meds Financial management: Spouse does this  Handwriting: 50-75% legible - By pt report "it sucks."  Pt had improved legibility with printing verus writing.  MOBILITY STATUS: Independent and remote h/o falls  POSTURE COMMENTS:  rounded shoulders, forward head, increased thoracic kyphosis, flexed trunk , and weight shift left   ACTIVITY TOLERANCE: Activity tolerance: Good  FUNCTIONAL OUTCOME MEASURES: Fastening/unfastening 3 buttons: 46.24 sec Physical performance test: PPT#2 (simulated eating) 16.96 & PPT#4 (donning/doffing jacket): 28.58 sec  COORDINATION: 9 Hole Peg test: Right: 31.54 sec; Left: 34.65 sec Box and Blocks:  Right 38 blocks, Left 38 blocks  UE ROM:  WFL - slight end range limitations  UE MMT:  WFL - 4-/5 Grip Strength:  Right: 53.1, 56.4, 55.7 - Average 55.1 lbs Left:  42.7, 51.3, 48.2 - Average 47.4 lbs  SENSATION: Pt reports his fingertips get cold and "turn blue", (not thumbs)   MUSCLE TONE: WFL  COGNITION: Overall cognitive status:  slower processing by report ie) gets frozen and asks for frequent feedback from spouse for memory  PD OBSERVATIONS: Dyskinesias, Postural tremors, and pt and spouse report some freezing   VISION: Pt has history of glaucoma and some eye injections with loss of peripheral vision, R eye limitations and confirms that he has used magnify glass.  Pt has Iphone.                                                                                                                    TODAY'S TREATMENT:        PATIENT EDUCATION: Education details: Production manager Person educated: Patient and Spouse Education method: Programmer, multimedia, Facilities manager, Verbal cues, and Handouts Education comprehension: verbalized understanding, returned demonstration, verbal cues required, and needs further education  HOME EXERCISE PROGRAM: TBD  GOALS: Goals reviewed with patient? Yes GOALS:  SHORT TERM GOALS: Target date: 10/22/23  Pt  will be independent with PD specific HEP. Baseline: not yet initiated Goal status: IN Progress  2.  Pt will independently recall and demonstrate at least 3 compensatory strategies/adaptations for visual impairment/memory aide without cueing including technology options with IPhone  Baseline: New to outpt OT - h/o glaucoma, has used magnify glass Goal status: IN Progress  3.  Pt will improve confidence with using his own strength ie) to open doors for wife, to be able to take a container off the shelf, and carry items from the car  Baseline: UE strength: 4-/5; Grip: Right: 55.1 lbs Left: 47.4 lbs Goal status: INITIAL  4.  Pt will write a grocery list x 10 words with no significant decrease in size and maintain 75% legibility. Baseline: 50-75% legibility Goal status: IN Progress  5.  Pt will demonstrate fine motor coordination for ADLs as evidenced by no decline and/or improvement in 9 hole peg test score and Box and Blocks Test for BUE. Baseline: 9 Hole: Right: 31.54 sec; Left: 34.65 sec  Box and Blocks: Right 38 blocks, Left 38 blocks Goal status: IN Progress   LONG TERM GOALS: Target date: 12/21/2023    Pt will verbalize understanding of ways to prevent future PD related complications and PD community resources. Baseline: not yet initiated Goal status: IN Progress  2.  Pt will verbalize understanding of ways to keep thinking skills sharp/WARM strategies and ways to compensate for STM changes now and in the future, including use of Memory Notebook. Baseline: not yet initiated Goal status: IN Progress  3.  Pt will verbalize understanding of adapted strategies to maximize safety, independence and timeliness with ADLs/IADLs. Baseline: Physical performance test: PPT#2 (simulated eating) 16.96 & PPT#4 (donning/doffing jacket): 28.58 sec Goal status: IN Progress  4.  Pt will demonstrate improved FM skills for max ease  with fastening buttons as evidenced by decreasing 3 button/unbutton  time by 5+ seconds Baseline: 46.24 secs Goal status: INITIAL    ASSESSMENT:  CLINICAL IMPRESSION: Patient is a 73 y.o. male who was seen today for occupational therapy treatment for memory deficits today r/t Parkinsons etc. Patient and spouse provided several options to address functional deficits and impairments as noted with need to review ideas and provide further handouts on strategies as well as HEPs and assist with binder organization. Pt will benefit from further skilled OT services in the outpatient setting to work on impairments and help pt return to max level of independence.   PERFORMANCE DEFICITS: in functional skills including ADLs, IADLs, coordination, dexterity, proprioception, sensation, tone, ROM, strength, pain, flexibility, Fine motor control, Gross motor control, mobility, balance, body mechanics, endurance, continence, decreased knowledge of precautions, decreased knowledge of use of DME, vision, and UE functional use, cognitive skills including attention, energy/drive, learn, memory, problem solving, safety awareness, thought, and understand, and psychosocial skills including coping strategies, environmental adaptation, and routines and behaviors.   IMPAIRMENTS: are limiting patient from ADLs, IADLs, and leisure.   COMORBIDITIES:  has co-morbidities such as glaucoma, history of back and foot surgery, HTN  that affects occupational performance. Patient will benefit from skilled OT to address above impairments and improve overall function.  REHAB POTENTIAL: Good   PLAN:  OT FREQUENCY: 2x/week  OT DURATION: 6 weeks  PLANNED INTERVENTIONS: 97168 OT Re-evaluation, 97535 self care/ADL training, 21308 therapeutic exercise, 97530 therapeutic activity, 97112 neuromuscular re-education, [phone] aquatic therapy, balance training, functional mobility training, visual/perceptual remediation/compensation, psychosocial skills training, energy conservation, coping strategies training,  patient/family education, and DME and/or AE instructions  RECOMMENDED OTHER SERVICES:   CONSULTED AND AGREED WITH PLAN OF CARE: Patient and family member/caregiver  PLAN FOR NEXT SESSION:  Pwr! Moves - GS  Review Visual comp strategies provided 3/25 and add vision HEP  - review phone modifications  Begin/progress HEP training - strength, coordination to increase confidence with using his own strength WARM memory strategies (potentially needs handout) - provide handouts in page protectors Memory Note Book - or Notes on phone - Sees ST for eval on 4/15 ADL comp strategies  Recheck testing    Victorino Sparrow, OT 10/27/2023, 4:57 PM

## 2023-10-28 ENCOUNTER — Ambulatory Visit: Admitting: Occupational Therapy

## 2023-10-28 ENCOUNTER — Encounter: Payer: Self-pay | Admitting: Physical Therapy

## 2023-10-28 ENCOUNTER — Ambulatory Visit: Admitting: Physical Therapy

## 2023-10-28 VITALS — BP 117/76 | HR 60

## 2023-10-28 DIAGNOSIS — R278 Other lack of coordination: Secondary | ICD-10-CM | POA: Diagnosis not present

## 2023-10-28 DIAGNOSIS — R2689 Other abnormalities of gait and mobility: Secondary | ICD-10-CM

## 2023-10-28 DIAGNOSIS — R251 Tremor, unspecified: Secondary | ICD-10-CM

## 2023-10-28 DIAGNOSIS — R2681 Unsteadiness on feet: Secondary | ICD-10-CM

## 2023-10-28 DIAGNOSIS — F801 Expressive language disorder: Secondary | ICD-10-CM | POA: Diagnosis not present

## 2023-10-28 DIAGNOSIS — R471 Dysarthria and anarthria: Secondary | ICD-10-CM | POA: Diagnosis not present

## 2023-10-28 DIAGNOSIS — M6281 Muscle weakness (generalized): Secondary | ICD-10-CM | POA: Diagnosis not present

## 2023-10-28 DIAGNOSIS — R29818 Other symptoms and signs involving the nervous system: Secondary | ICD-10-CM | POA: Diagnosis not present

## 2023-10-28 NOTE — Patient Instructions (Signed)
Bag Exercises:  Small trash bag or produce bag works best.  For all exercises, sit with big posture (sit up tall with head up) and use big movements. Perform the following exercises 1 times per day.  Hold bag in one hand. Stretch both arms/hands out to the side as big as you can. Then, pass bag from one hand to the other IN FRONT of you. Stretch arms back out big after each pass. Repeat 10 times. Hold bag in one hand. Stretch both arms/hands out to the side as big as you can. Then, pass bag from one hand to the other BEHIND you. Stretch arms back out big after each pass. Repeat 10 times. Hold bag in right hand. Move right hand to reach behind shoulder. Then, reach behind back with left hand to pass bag from right hand to left hand. Switch sides. Repeat 5-10 times on each side. Hold bag in both hands in front of you with hands/arms shoulder length apart. Move bag behind your head. Repeat 10 times.  Challenge: Hold bag in both hands in front of you with hands/arms shoulder length apart. Lift leg and move bag completely under each foot and back. Repeat 5 times on each side.

## 2023-10-28 NOTE — Therapy (Unsigned)
 OUTPATIENT PHYSICAL THERAPY NEURO TREATMENT   Patient Name: KEONTRE DEFINO MRN: [id number] DOB:1950/12/16, 73 y.o., male Today's Date: 10/29/2023   PCP: Ronnald Nian, MD REFERRING PROVIDER: Ihor Austin, NP  END OF SESSION:  PT End of Session - 10/28/23 1318     Visit Number 7    Number of Visits 13    Date for PT Re-Evaluation 11/16/23    Authorization Type [employer] Medicare    PT Start Time 1315    PT Stop Time 1400    PT Time Calculation (min) 45 min    Equipment Utilized During Treatment Gait belt    Activity Tolerance Patient tolerated treatment well    Behavior During Therapy WFL for tasks assessed/performed             Past Medical History:  Diagnosis Date   Allergy    seasonal   Arthritis    BPH (benign prostatic hyperplasia)    Complication of anesthesia    Pt. stated he had a reaction that ended in him requiring urinary cath placement; hx delirium worsening parkinson symptoms   Diverticulosis    GERD (gastroesophageal reflux disease)    esophageal spasms   Glaucoma    Gout    Head injury, closed, with concussion    Hepatitis C    chronic - Has been treated with Harvoni   HLD (hyperlipidemia)    statin intolerant (Crestor & Simvastatin) - Taking Livalo 1mg  / week   Hypertension    Parkinson's disease (HCC)    Plantar fasciitis    right   PVD (peripheral vascular disease) (HCC)    With no claudication; only mild abdominal aortic atherosclerosis noted on ultrasound.   Spinal stenosis of lumbar region    Thoracic ascending aortic aneurysm (HCC)    4.2 cm ascending TAA 09/2016 CT, 1 yr f/u rec   Ulcer    Past Surgical History:  Procedure Laterality Date   BUNIONECTOMY WITH WEIL OSTEOTOMY Right 11/02/2019   Procedure: Right Foot Lapidus, Modified McBride Bunionectomy,  Hallux Akin Osteotomy;  Surgeon: Toni Arthurs, MD;  Location: [facility];  Service: Orthopedics;  Laterality: Right;   CARDIAC CATHETERIZATION  2005   30% Cx. Dr.  Elsie Lincoln   cataract surgery Left 11/05/2014   COLONOSCOPY     COLONOSCOPY N/A 01/09/2021   Procedure: COLONOSCOPY;  Surgeon: Kerin Salen, MD;  Location: [facility];  Service: Gastroenterology;  Laterality: N/A;   ESOPHAGOGASTRODUODENOSCOPY N/A 05/02/2015   Procedure: ESOPHAGOGASTRODUODENOSCOPY (EGD);  Surgeon: Bernette Redbird, MD;  Location: [facility];  Service: Endoscopy;  Laterality: N/A;   ESOPHAGOGASTRODUODENOSCOPY  05/02/2015   no source of pt chest pain endoscopically evident. small hiatal hernia.   ESOPHAGOGASTRODUODENOSCOPY (EGD) WITH PROPOFOL N/A 01/02/2021   Procedure: ESOPHAGOGASTRODUODENOSCOPY (EGD) WITH PROPOFOL;  Surgeon: Vida Rigger, MD;  Location: [facility];  Service: Endoscopy;  Laterality: N/A;   EYE SURGERY     HARDWARE REMOVAL Right 11/02/2019   Procedure: Second Metatarsal Removal of Deep Implant and Rotational Osteotomy;  Surgeon: Toni Arthurs, MD;  Location: [facility];  Service: Orthopedics;  Laterality: Right;   IR ANGIOGRAM SELECTIVE EACH ADDITIONAL VESSEL  01/04/2021   IR ANGIOGRAM SELECTIVE EACH ADDITIONAL VESSEL  01/04/2021   IR ANGIOGRAM VISCERAL SELECTIVE  01/04/2021   IR US GUIDE VASC ACCESS RIGHT  01/04/2021   LUMBAR LAMINECTOMY/DECOMPRESSION MICRODISCECTOMY N/A 07/03/2021   Procedure: Lumbar three through five decompression with lumbar three through five insitu fusion;  Surgeon: Venita Lick, MD;  Location: [facility];  Service: Orthopedics;  Laterality: N/A;   MEMBRANE PEEL Left 03/14/2014   Procedure: MEMBRANE PEEL; ENDOLASER;  Surgeon: Edmon Crape, MD;  Location: [facility];  Service: Ophthalmology;  Laterality: Left;   NM MYOVIEW LTD  10/2015   LOW RISK. Small, fixed basal lateral defect - likely diaphragmatic attenuation. 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  No R WMA.  GRII DD-moderately dilated LA..  Mildly dilated RV but normal function.  Normal RAP/CVP.  Trivial AI with mild to moderate sclerosis-no stenosis   UMBILICAL HERNIA REPAIR  2023   UPPER GASTROINTESTINAL ENDOSCOPY     WEIL OSTEOTOMY Right 09/01/2017   Procedure: RIGHT GREAT TOE CHEVRON AND WEIL OSTEOTOMY 2ND METATARSAL;  Surgeon: Nadara Mustard, MD;  Location: [facility];  Service: Orthopedics;  Laterality: Right;   XI ROBOTIC ASSISTED VENTRAL HERNIA N/A 03/17/2022   Procedure: XI ROBOTIC ASSISTED VENTRAL HERNIA;  Surgeon: Henrene Dodge, MD;  Location: [facility];  Service: General;  Laterality: N/A;   Patient Active Problem List   Diagnosis Date Noted   Ventral hernia without obstruction or gangrene    Primary open angle glaucoma of left eye, mild stage 06/23/2021   Facial numbness 12/24/2020   Neovascular glaucoma, right eye, stage unspecified 12/16/2020   Lumbar radiculopathy 11/25/2020   Gastroesophageal reflux disease 09/10/2020   Dry eyes, bilateral 08/15/2020   Presbycusis of right ear 01/17/2020   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   Secondary corneal edema of right eye 12/12/2019   Ptosis of right eyelid 12/12/2019   OAB (overactive bladder) 11/29/2019   Bunion of great toe of right foot 07/14/2017   Claw toe, acquired, right 07/14/2017   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   Medication management 10/09/2016   Chest pain at rest 07/04/2015   Plantar fasciitis of right foot 02/19/2015   Depression 08/14/2014   Family history of heart disease in male family member before age 53 04/26/2014   Mild cognitive impairment 03/13/2014   Parkinsonian tremor (HCC) 02/12/2014   Hyperlipidemia with target LDL less than 100    Abdominal aortic atherosclerosis (HCC)    Moderate essential hypertension  02/25/2011   Arthropathy 02/25/2011   HAV (hallux abducto valgus) 02/25/2011    ONSET DATE: 09/08/2023 (referral)   REFERRING DIAG: G20.C (ICD-10-CM) - Primary parkinsonism (HCC)  THERAPY DIAG:  Unsteadiness on feet  Other abnormalities of gait and mobility  Rationale for Evaluation and Treatment: Rehabilitation  SUBJECTIVE:  SUBJECTIVE STATEMENT: Denies falls and near falls. Continues to wear compression socks. States would be open to trying the rollator and cane again. Denies any dizziness or lightlessness today.   Pt accompanied by: self   PERTINENT HISTORY:  tremor (2014), parkinsonism, allergies, arthritis, reflux disease, glaucoma, gout, hepatitis C, hyperlipidemia, hypertension, plantar fasciitis, peripheral vascular disease, and BPH, admitted 12/8 for L3-5 spinal fusion   PAIN:  Are you having pain? Yes: NPRS scale: 2/10 Pain location: back pain Pain description: achy Aggravating factors: unknown Relieving factors: sitting down or resting, taking Tylenol or ibuprofen   PRECAUTIONS: Fall and Other: blind in R eye  RED FLAGS: None   WEIGHT BEARING RESTRICTIONS: No  FALLS: Has patient fallen in last 6 months? No and reports frequent near misses   LIVING ENVIRONMENT: Lives with: lives with their spouse Lives in: House/apartment Stairs: Yes: Internal: 2 flights steps; on left going up and External: 1 in front and 3 in side steps; none Has following equipment at home: Single point cane, Walker - 2 wheeled, shower chair, and Grab bars  PLOF: Needs assistance with ADLs and Needs assistance with homemaking  PATIENT GOALS: "Just wanting to have as much control as I can"   OBJECTIVE:  Note: Objective measures were completed at Evaluation unless otherwise noted.  DIAGNOSTIC FINDINGS:  CT of R foot from 2021    IMPRESSION: 1. Interval surgical arthrodesis at the 1st tarsometatarsal articulation. There is no solid osseous ankylosis across the joint, and there is intraosseous cyst formation dorsally. 2. Interval placement of a cortical screw in the medial base of the 1st proximal phalanx without hardware loosening or associated osseous abnormality. 3. Stable deformity of the 1st metatarsal head and neck consistent with previous osteotomy. Stable chronic Freiberg infraction and postsurgical changes in the 2nd metatarsal head. 4. Stable additional mild degenerative changes. No acute findings or significant joint effusions.  COGNITION: Overall cognitive status: Impaired   SENSATION: Pt reports occasional numbness/tingling in BLEs    MUSCLE TONE: LLE: Rigidity   POSTURE: rounded shoulders, forward head, increased thoracic kyphosis, flexed trunk , and weight shift left  LOWER EXTREMITY ROM:     Active  Right Eval Left Eval  Hip flexion    Hip extension    Hip abduction    Hip adduction    Hip internal rotation    Hip external rotation    Knee flexion    Knee extension    Ankle dorsiflexion    Ankle plantarflexion    Ankle inversion    Ankle eversion     (Blank rows = not tested)  LOWER EXTREMITY MMT:    MMT Right Eval Left Eval  Hip flexion    Hip extension    Hip abduction    Hip adduction    Hip internal rotation    Hip external rotation    Knee flexion    Knee extension    Ankle dorsiflexion    Ankle plantarflexion    Ankle inversion    Ankle eversion    (Blank rows = not tested)  BED MOBILITY:  Pt reports independence w/this but it is difficult due to elevated height of the bed and foot pain   TRANSFERS: Assistive device utilized: None  Sit to stand: SBA Stand to sit: SBA Chair to chair: SBA Pt demonstrates laterally instability to R side, wide BOS, decreased eccentric control   GAIT: Gait pattern: step through pattern,  decreased arm swing- Left, decreased stride length, lateral hip instability, decreased trunk  rotation, trunk flexed, and wide BOS Distance walked: Various clinic distances  Assistive device utilized: None Level of assistance: SBA and CGA Comments: Pt maintains L hand in fist. Wide BOS w/decreased lateral weight shift. Pt lost balance posteriorly w/turn, requiring CGA for safety                                                                                                            TREATMENT:   TherAct: Vitals:   10/28/23 1323  BP: 117/76  Pulse: 60   Assessed BP and WFL for therapy. Patient asymptomatic with standing balance tasks for dizziness throughout session; continues to wear compression socks, educated patient that should not be sleeping in compression socks as patient reports doing this the night before Reviewed AD from previous sessions, patient reports that though he doesn't really want to use an AD, he thinks it might be necessary and is agreeable to trialing again in today's session; see gait session for trial and recommendations   Floor Recovery: Patient educated in floor recovery this visit using teach-back for injury assessment and sequencing of task in clinic setting.  Discussion of transfer of skills to variable scenarios outside the clinic. Performed 2 times. Caregiver Training:  Caregiver not present..   Level of Assist:  SBA and CGA.    *Required use of mat to pushup and standing balance initially more unsteady on initial attempt but very steady on second, cued on pacing given BP fluctuations and was able to perform asymptomatic  Gait:  GAIT: Gait pattern: step through pattern, decreased arm swing- Left, decreased stride length, lateral hip instability, decreased trunk rotation, trunk flexed, and wide BOS Distance walked: Various clinic distances with no AD  Assistive device utilized: None  Level of assistance: SBA and CGA Comments: Pt maintains L hand in fist  with intermittent scissor step and lateral instability   Reviewed trial of SPC 230' x 1 (CGA), pt holding cane in RUE, would not recommend, patient unable to continually sequence and looks more unsteady without AD while trying to correctly sequence, PT and patient both agree not most appropriate AD  Trialed rollator 230' x 1 + 3 x 40' (SBA-modI), initially required review of break use - benefited from car analogy (wouldn't get into car while moving or get out of one while moving) to cue proper break use able to complete 2x transfers with min verbal cues and 3x transfers without any cueing by end of session  Per patient request, had patient spouse come into end of session and reviewed recommendation of rollator for home, provided visual printout of recommendation, they stated they would discuss at home and decide    PATIENT EDUCATION: Education details: See above + continue HEP Person educated: Patient and Spouse Education method: Explanation, Demonstration, Verbal cues, and Handouts Education comprehension: verbalized understanding, returned demonstration, verbal cues required, and needs further education  HOME EXERCISE PROGRAM: Access Code: [email or web address] URL: [email or web address] Date: 10/05/2023 Prepared by: Alethia Berthold Plaster  Exercises - Seated Windmill Trunk Rotation Stretch  - 1 x daily - 7 x  weekly - 3 sets - 10 reps - Standing Reach to Opposite Side with Weight Shift  - 1 x daily - 7 x weekly - 3 sets - 10 reps  *PWR! Moves discontinued as too cognitively challenging for patient at this time  GOALS: Goals reviewed with patient? Yes  SHORT TERM GOALS: Target date: 10/26/2023   Pt will be independent with initial HEP for improved strength, balance, transfers and gait.  Baseline: not established on eval, reports confidence with initial HEP Goal status: MET  2.  MiniBest to be assessed and LTG updated  Baseline:  Goal status: MET  3.  Pt and wife will verbalize and  demonstrate freezing strategies to implement at home and in community for reduced fall risk  Baseline:  Goal status: INITIAL  4.  MCTSIB to be assessed and LTG updated Baseline:  Goal status: MET    LONG TERM GOALS: Target date: 11/09/2023   Pt will verbalize understanding of local PD community resources, including fitness post DC.   Baseline:  Goal status: INITIAL  2. Pt will improve MiniBest to 23/28 for decreased fall risk and improvement with compensatory stepping strategies.   Baseline: 19/28 Goal status: REVISED   3.  Pt will improve 5 x STS to less than or equal to 15 seconds w/o UE support and proper body mechanics to demonstrate improved functional strength and transfer efficiency.   Baseline: 19.55s no UE support, weight shift to R Goal status: INITIAL  4.  Pt will perform floor transfer w/SBA for improved fall recovery and safety  Baseline:  Goal status: INITIAL  5.  MCTSIB goal Baseline: 120/120 Goal status: DC due to high baseline score    ASSESSMENT:  CLINICAL IMPRESSION: Emphasis on skilled physical therapy session on review of proper AD as well as floor recovery. PT would recommend rollator for patient at least in off periods as greatly improves stability and stride length. Patient able to performs fall recovery with SBA by end of session. Will continue per POC.   OBJECTIVE IMPAIRMENTS: Abnormal gait, decreased activity tolerance, decreased balance, decreased cognition, decreased coordination, decreased knowledge of condition, decreased knowledge of use of DME, decreased mobility, difficulty walking, decreased strength, decreased safety awareness, impaired sensation, impaired UE functional use, impaired vision/preception, improper body mechanics, and pain.   ACTIVITY LIMITATIONS: carrying, lifting, bending, standing, squatting, stairs, transfers, bed mobility, bathing, dressing, hygiene/grooming, and locomotion level  PARTICIPATION LIMITATIONS: meal prep,  cleaning, laundry, medication management, driving, shopping, community activity, and yard work  PERSONAL FACTORS: Age, Fitness, Past/current experiences, and 1-2 comorbidities: PD, blindness in R eye  are also affecting patient's functional outcome.   REHAB POTENTIAL: Fair due to impaired vision, PD  CLINICAL DECISION MAKING: Evolving/moderate complexity  EVALUATION COMPLEXITY: Moderate  PLAN:  PT FREQUENCY: 1-2x/week  PT DURATION: 6 weeks  PLANNED INTERVENTIONS: 97164- PT Re-evaluation, 97110-Therapeutic exercises, 97530- Therapeutic activity, 97112- Neuromuscular re-education, 97535- Self Care, [id number]- Manual therapy, [phone]- Gait training, [phone]- Orthotic Fit/training, [phone]- Canalith repositioning, [phone]- Aquatic Therapy, [phone]- Electrical stimulation (manual), Patient/Family education, Balance training, Stair training, Dry Needling, Joint mobilization, Vestibular training, and DME instructions  PLAN FOR NEXT SESSION:  Add to HEP to work on turns, truncal mobility and compensation strategies for impaired peripheral vision, lateral weight shift to L, posterior stepping strategy and balance reactions when looking over shoulder   Review safety with rollator and if still appropriate get for home, review freezing strategies with patient for STG #2  Carmelia Bake, PT, DPT 10/29/2023, 9:19 AM

## 2023-10-28 NOTE — Therapy (Signed)
 OUTPATIENT OCCUPATIONAL THERAPY PARKINSON'S TREATMENT  Patient Name: [name] MRN: [id number] DOB:11/16/1950, 73 y.o., male Today's Date: 10/28/2023  PCP: Ronnald Nian, MD REFERRING PROVIDER: Ihor Austin, NP  END OF SESSION:  OT End of Session - 10/28/23 1230     Visit Number 7    Number of Visits 13   including eval/tx   Date for OT Re-Evaluation 11/24/23    Authorization Type [employer] 2025    Authorization Time Period Auth# [id number] 09/28/23 to 11/09/23 [facility] [employer] 2025    Authorization - Number of Visits 13    Progress Note Due on Visit 10    OT Start Time 1230    OT Stop Time 1315    OT Time Calculation (min) 45 min    Equipment Utilized During Treatment Scarf    Activity Tolerance Patient tolerated treatment well    Behavior During Therapy WFL for tasks assessed/performed             Past Medical History:  Diagnosis Date   Allergy    seasonal   Arthritis    BPH (benign prostatic hyperplasia)    Complication of anesthesia    Pt. stated he had a reaction that ended in him requiring urinary cath placement; hx delirium worsening parkinson symptoms   Diverticulosis    GERD (gastroesophageal reflux disease)    esophageal spasms   Glaucoma    Gout    Head injury, closed, with concussion    Hepatitis C    chronic - Has been treated with Harvoni   HLD (hyperlipidemia)    statin intolerant (Crestor & Simvastatin) - Taking Livalo 1mg  / week   Hypertension    Parkinson's disease (HCC)    Plantar fasciitis    right   PVD (peripheral vascular disease) (HCC)    With no claudication; only mild abdominal aortic atherosclerosis noted on ultrasound.   Spinal stenosis of lumbar region    Thoracic ascending aortic aneurysm (HCC)    4.2 cm ascending TAA 09/2016 CT, 1 yr f/u rec   Ulcer    Past Surgical History:  Procedure Laterality Date   BUNIONECTOMY WITH WEIL OSTEOTOMY Right 11/02/2019   Procedure: Right Foot Lapidus, Modified McBride  Bunionectomy,  Hallux Akin Osteotomy;  Surgeon: Toni Arthurs, MD;  Location: [facility];  Service: Orthopedics;  Laterality: Right;   CARDIAC CATHETERIZATION  2005   30% Cx. Dr. Elsie Lincoln   cataract surgery Left 11/05/2014   COLONOSCOPY     COLONOSCOPY N/A 01/09/2021   Procedure: COLONOSCOPY;  Surgeon: Kerin Salen, MD;  Location: [facility];  Service: Gastroenterology;  Laterality: N/A;   ESOPHAGOGASTRODUODENOSCOPY N/A 05/02/2015   Procedure: ESOPHAGOGASTRODUODENOSCOPY (EGD);  Surgeon: Bernette Redbird, MD;  Location: [facility];  Service: Endoscopy;  Laterality: N/A;   ESOPHAGOGASTRODUODENOSCOPY  05/02/2015   no source of pt chest pain endoscopically evident. small hiatal hernia.   ESOPHAGOGASTRODUODENOSCOPY (EGD) WITH PROPOFOL N/A 01/02/2021   Procedure: ESOPHAGOGASTRODUODENOSCOPY (EGD) WITH PROPOFOL;  Surgeon: Vida Rigger, MD;  Location: [facility];  Service: Endoscopy;  Laterality: N/A;   EYE SURGERY     HARDWARE REMOVAL Right 11/02/2019   Procedure: Second Metatarsal Removal of Deep Implant and Rotational Osteotomy;  Surgeon: Toni Arthurs, MD;  Location: [facility];  Service: Orthopedics;  Laterality: Right;   IR ANGIOGRAM SELECTIVE EACH ADDITIONAL VESSEL  01/04/2021   IR ANGIOGRAM SELECTIVE EACH ADDITIONAL VESSEL  01/04/2021   IR ANGIOGRAM VISCERAL SELECTIVE  01/04/2021   IR US GUIDE VASC ACCESS  RIGHT  01/04/2021   LUMBAR LAMINECTOMY/DECOMPRESSION MICRODISCECTOMY N/A 07/03/2021   Procedure: Lumbar three through five decompression with lumbar three through five insitu fusion;  Surgeon: Venita Lick, MD;  Location: [facility] OR;  Service: Orthopedics;  Laterality: N/A;   MEMBRANE PEEL Left 03/14/2014   Procedure: MEMBRANE PEEL; ENDOLASER;  Surgeon: Edmon Crape, MD;  Location: [facility] OR;  Service: Ophthalmology;  Laterality: Left;   NM MYOVIEW LTD  10/2015   LOW RISK. Small, fixed basal lateral defect - likely diaphragmatic attenuation. 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  No R WMA.  GRII DD-moderately dilated LA..  Mildly dilated RV but normal function.  Normal RAP/CVP.  Trivial AI with mild to moderate sclerosis-no stenosis   UMBILICAL HERNIA REPAIR  2023   UPPER GASTROINTESTINAL ENDOSCOPY     WEIL OSTEOTOMY Right 09/01/2017   Procedure: RIGHT GREAT TOE CHEVRON AND WEIL OSTEOTOMY 2ND METATARSAL;  Surgeon: Nadara Mustard, MD;  Location: [facility] OR;  Service: Orthopedics;  Laterality: Right;   XI ROBOTIC ASSISTED VENTRAL HERNIA N/A 03/17/2022   Procedure: XI ROBOTIC ASSISTED VENTRAL HERNIA;  Surgeon: Henrene Dodge, MD;  Location: [facility];  Service: General;  Laterality: N/A;   Patient Active Problem List   Diagnosis Date Noted   Ventral hernia without obstruction or gangrene    Primary open angle glaucoma of left eye, mild stage 06/23/2021   Facial numbness 12/24/2020   Neovascular glaucoma, right eye, stage unspecified 12/16/2020   Lumbar radiculopathy 11/25/2020   Gastroesophageal reflux disease 09/10/2020   Dry eyes, bilateral 08/15/2020   Presbycusis of right ear 01/17/2020   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   Secondary corneal edema of right eye 12/12/2019   Ptosis of right eyelid 12/12/2019   OAB (overactive bladder) 11/29/2019   Bunion of great toe of right foot 07/14/2017   Claw toe, acquired, right 07/14/2017   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   Medication management 10/09/2016   Chest pain at rest 07/04/2015   Plantar fasciitis of right foot 02/19/2015   Depression 08/14/2014   Family history of heart disease in male family member before age 64 04/26/2014   Mild cognitive  impairment 03/13/2014   Parkinsonian tremor (HCC) 02/12/2014   Hyperlipidemia with target LDL less than 100    Abdominal aortic atherosclerosis (HCC)    Moderate essential hypertension 02/25/2011   Arthropathy 02/25/2011   HAV (hallux abducto valgus) 02/25/2011   ONSET DATE: 09/20/2023  REFERRING DIAG: G20.C (ICD-10-CM) - Parkinsonism, unspecified  THERAPY DIAG:  Other lack of coordination  Muscle weakness (generalized)  Tremor  Other symptoms and signs involving the nervous system  Rationale for Evaluation and Treatment: Rehabilitation  SUBJECTIVE:   SUBJECTIVE STATEMENT:  Pt accompanied by: self  Pt reports he has been still been having trouble with his back and activities today were modified from standing to seated as appropriate.  Pt brought his Memory Binder and was given a note re: next week's therapy appt to continue to empower him to take more responsibility to remember things.    PERTINENT HISTORY: Tremor (2015), parkinsonism, allergies, arthritis, reflux disease, glaucoma, gout, hepatitis C, hyperlipidemia, hypertension,  plantar fasciitis, peripheral vascular disease, and BPH, admitted 07/03/2021 for L3-5 spinal fusion Back, foot, hernia, neuro surgery (pains)  PRECAUTIONS: Fall and Other: Vision loss R eye  WEIGHT BEARING RESTRICTIONS: No  PAIN: Pain in back  Are you having pain? Yes: NPRS scale: at worst 6-7/10 Pain location: Lower back (every day) and some down right leg Pain description:  can be sharp pains, often just aching  Aggravating factors: Moving, bending, lifting Relieving factors: Ibuprofen, acetaminophen, resting, heating pad  FALLS: Has patient fallen in last 6 months? No - near misses - ie) getting out of the chair  LIVING ENVIRONMENT: Lives with: lives with their spouse Lives in: House/apartment Stairs: Yes: Internal: 14+ steps; on left going up and External: 2-3 steps; can reach both Has following equipment at home: Single point cane,  Walker - 2 wheeled, shower chair, Grab bars, and elevated toilet seat, walk in shower with removable seat - not using it at this time  PLOF: Independent with basic ADLs, Independent with community mobility without device, Needs assistance with homemaking, and Not driving  PATIENT GOALS: Pt reported that he wanted to be able to improve memory/recall, regain/improve balance ie) to minimize getting frozen, keep up/improve strength ie) to open doors for wife, to be able to take a container off the shelf, get the bottles of water from the car etc.  OBJECTIVE:  Note: Objective measures were completed at Evaluation unless otherwise noted.  HAND DOMINANCE: Right  ADLs: Overall ADLs: Occasional support from spouse Transfers/ambulation related to ADLs: Ind/Mod I Eating: Pt can cut some foods himself but may get help Grooming: Pt shaves himself - uses regular razor UB Dressing: Ind - may get help to pull his jacket off LB Dressing: Ind Toileting: Ind - uses regular undergarments, but has used pull-ups at night but is not using them at this time Bathing: Showers a couple of times/week and stands in the shower despite having a shower stool available Tub Shower transfers: Mod I - has grab bars Equipment: Shower seat without back, Grab bars, Walk in shower, and Reacher  IADLs: Not driving due to vision trouble - ie) limited peripheral vision Shopping: Goes with spouse Light housekeeping: Pt reports helping to wash dishes Meal Prep: Helps spouse but is slower getting things done.  He likes to cook breakfast. Community mobility: Ind without AE Medication management: Spouse helps take care of meds Financial management: Spouse does this  Handwriting: 50-75% legible - By pt report "it sucks."  Pt had improved legibility with printing verus writing.  MOBILITY STATUS: Independent and remote h/o falls  POSTURE COMMENTS:  rounded shoulders, forward head, increased thoracic kyphosis, flexed trunk , and  weight shift left   ACTIVITY TOLERANCE: Activity tolerance: Good  FUNCTIONAL OUTCOME MEASURES: Fastening/unfastening 3 buttons: 46.24 sec Physical performance test: PPT#2 (simulated eating) 16.96 & PPT#4 (donning/doffing jacket): 28.58 sec  COORDINATION: 9 Hole Peg test: Right: 31.54 sec; Left: 34.65 sec Box and Blocks:  Right 38 blocks, Left 38 blocks  UE ROM:  WFL - slight end range limitations  UE MMT:   WFL - 4-/5 Grip Strength:  Right: 53.1, 56.4, 55.7 - Average 55.1 lbs Left:  42.7, 51.3, 48.2 - Average 47.4 lbs  SENSATION: Pt reports his fingertips get cold and "turn blue", (not thumbs)   MUSCLE TONE: WFL  COGNITION: Overall cognitive status:  slower processing by report ie) gets frozen and asks for frequent feedback from spouse for memory  PD OBSERVATIONS: Dyskinesias, Postural tremors, and pt and spouse  report some freezing   VISION: Pt has history of glaucoma and some eye injections with loss of peripheral vision, R eye limitations and confirms that he has used magnify glass.  Pt has Iphone.                                                                                                                    TODAY'S TREATMENT:   Therapeutic Exercises:  Pt educated in using big, deliberate movements with B UE "Bag Exercises" per pt instructions to decrease stiffness, increase UE ROM/extension, decrease tremulous motions and overall increase UE coordination for different self care activities.  This included stretching arms away from his body and moving/passing a scarf (in therapy) but can use a plastic bag at home, through big movements to sides of his body, in front of body and behind his body.  Motions included the following:  1) Stretch arms out to the SIDE, pass item from one hand to the other IN FRONT of body.  2) Stretch arms out to the SIDE, pass item from one hand to the other BEHIND body. 3) Stretch arms out in FRONT, pass item from one hand to other hand BEHIND  shoulder/neck. 4) Stretch arms out in FRONT, hold item with BOTH hands and move item BEHIND head with both hands together. 5) Stretch arms out to the SIDE, lift leg and pass item UNDER each leg (working towards foot) - back and forth.   Self Care: Pt engaged in translating large amplitude motions presented through Bag Exercises, into dressing activities ie) lifting circular scarf overhead like a shirt (#4 above), donning jacket/shirt by reaching behind head (#2 & 3 above), pushing arms through sleeves (# 1 above) and LB dressing (#5 above).    PATIENT EDUCATION: Education details: Architect per pt instructions Person educated: Patient Education method: Explanation, Demonstration, Tactile cues, Verbal cues, and Handouts Education comprehension: verbalized understanding, returned demonstration, verbal cues required, and needs further education  HOME EXERCISE PROGRAM: 10/19/23: Visual Compensatory Strategies  10/28/23: Bag exercises  GOALS: Goals reviewed with patient? Yes  SHORT TERM GOALS: Target date: 10/22/23  Pt will be independent with PD specific HEP. Baseline: not yet initiated Goal status: IN Progress  2.  Pt will independently recall and demonstrate at least 3 compensatory strategies/adaptations for visual impairment/memory aide without cueing including technology options with IPhone  Baseline: New to outpt OT - h/o glaucoma, has used magnify glass Goal status: IN Progress  3.  Pt will improve confidence with using his own strength ie) to open doors for wife, to be able to take a container off the shelf, and carry items from the car  Baseline: UE strength: 4-/5; Grip: Right: 55.1 lbs Left: 47.4 lbs Goal status: IN Progress  4.  Pt will write a grocery list x 10 words with no significant decrease in size and maintain 75% legibility. Baseline: 50-75% legibility Goal status: IN Progress  5.  Pt will demonstrate fine motor coordination for ADLs as evidenced by no decline  and/or improvement in 9 hole peg test score and Box and Blocks Test for BUE. Baseline: 9 Hole: Right: 31.54 sec; Left: 34.65 sec  Box and Blocks: Right 38 blocks, Left 38 blocks Goal status: IN Progress   LONG TERM GOALS: Target date: 12/21/2023    Pt will verbalize understanding of ways to prevent future PD related complications and PD community resources. Baseline: not yet initiated Goal status: IN Progress  2.  Pt will verbalize understanding of ways to keep thinking skills sharp/WARM strategies and ways to compensate for STM changes now and in the future, including use of Memory Notebook. Baseline: not yet initiated Goal status: IN Progress  3.  Pt will verbalize understanding of adapted strategies to maximize safety, independence and timeliness with ADLs/IADLs. Baseline: Physical performance test: PPT#2 (simulated eating) 16.96 & PPT#4 (donning/doffing jacket): 28.58 sec Goal status: IN Progress  4.  Pt will demonstrate improved FM skills for max ease with fastening buttons as evidenced by decreasing 3 button/unbutton time by 5+ seconds Baseline: 46.24 secs Goal status: IN Progress    ASSESSMENT:  CLINICAL IMPRESSION: Patient is a 73 y.o. male who was seen today for occupational therapy treatment for Parkinson's related symptoms ie) incoordination and tremors of UEs. Patient provided several large amplitude exercises today to help with carryover with ADLS to maximize ease with dressing etc.. Pt will benefit from further skilled OT services in the outpatient setting to work on impairments and help pt return to max level of independence including Pwr! Exercises, review ideas and provide further handouts on strategies for HEP schedule and assist with binder organization for PD specific info.  PERFORMANCE DEFICITS: in functional skills including ADLs, IADLs, coordination, dexterity, proprioception, sensation, tone, ROM, strength, pain, flexibility, Fine motor control, Gross motor  control, mobility, balance, body mechanics, endurance, continence, decreased knowledge of precautions, decreased knowledge of use of DME, vision, and UE functional use, cognitive skills including attention, energy/drive, learn, memory, problem solving, safety awareness, thought, and understand, and psychosocial skills including coping strategies, environmental adaptation, and routines and behaviors.   IMPAIRMENTS: are limiting patient from ADLs, IADLs, and leisure.   COMORBIDITIES:  has co-morbidities such as glaucoma, history of back and foot surgery, HTN  that affects occupational performance. Patient will benefit from skilled OT to address above impairments and improve overall function.  REHAB POTENTIAL: Good   PLAN:  OT FREQUENCY: 2x/week  OT DURATION: 6 weeks  PLANNED INTERVENTIONS: 97168 OT Re-evaluation, 97535 self care/ADL training, 91478 therapeutic exercise, 97530 therapeutic activity, 97112 neuromuscular re-education, [phone] aquatic therapy, balance training, functional mobility training, visual/perceptual remediation/compensation, psychosocial skills training, energy conservation, coping strategies training, patient/family education, and DME and/or AE instructions  RECOMMENDED OTHER SERVICES:   CONSULTED AND AGREED WITH PLAN OF CARE: Patient and family member/caregiver  PLAN FOR NEXT SESSION:  Pwr! Moves - GS/KB  Review Visual comp strategies provided 3/25 and add vision HEP  - review phone modifications  Progress HEP training - strength, coordination to increase confidence with using his own strength WARM memory strategies (potentially needs handout) - provide handouts in page protectors Memory Note Book - or Notes on phone - Sees ST for eval on 4/15 ADL comp strategies Tremor handouts Add Bag activities with can/weight as appropriate and add following activity - Hold bag in one hand. Toss bag up and catch with the same/opposite hand.   Recheck coordination testing Auth  through 11/09/23   Victorino Sparrow, OT 10/28/2023, 3:07 PM

## 2023-11-01 ENCOUNTER — Other Ambulatory Visit (HOSPITAL_COMMUNITY): Payer: Self-pay

## 2023-11-01 ENCOUNTER — Other Ambulatory Visit: Payer: Self-pay

## 2023-11-02 ENCOUNTER — Ambulatory Visit: Admitting: Physical Therapy

## 2023-11-02 ENCOUNTER — Ambulatory Visit: Admitting: Occupational Therapy

## 2023-11-02 ENCOUNTER — Encounter: Payer: Self-pay | Admitting: Adult Health

## 2023-11-02 VITALS — BP 111/77 | HR 63

## 2023-11-02 DIAGNOSIS — R251 Tremor, unspecified: Secondary | ICD-10-CM | POA: Diagnosis not present

## 2023-11-02 DIAGNOSIS — R29818 Other symptoms and signs involving the nervous system: Secondary | ICD-10-CM

## 2023-11-02 DIAGNOSIS — M6281 Muscle weakness (generalized): Secondary | ICD-10-CM | POA: Diagnosis not present

## 2023-11-02 DIAGNOSIS — R2689 Other abnormalities of gait and mobility: Secondary | ICD-10-CM

## 2023-11-02 DIAGNOSIS — R2681 Unsteadiness on feet: Secondary | ICD-10-CM

## 2023-11-02 DIAGNOSIS — R41842 Visuospatial deficit: Secondary | ICD-10-CM

## 2023-11-02 DIAGNOSIS — R41840 Attention and concentration deficit: Secondary | ICD-10-CM

## 2023-11-02 DIAGNOSIS — F801 Expressive language disorder: Secondary | ICD-10-CM | POA: Diagnosis not present

## 2023-11-02 DIAGNOSIS — R278 Other lack of coordination: Secondary | ICD-10-CM | POA: Diagnosis not present

## 2023-11-02 DIAGNOSIS — R471 Dysarthria and anarthria: Secondary | ICD-10-CM | POA: Diagnosis not present

## 2023-11-02 DIAGNOSIS — R41844 Frontal lobe and executive function deficit: Secondary | ICD-10-CM

## 2023-11-02 NOTE — Patient Instructions (Addendum)
PWR! Hand Exercises Perform each exercise at least 10 repetitions 1x/day, and PWR! PUSH throughout the day when you are having trouble using your hands (picking up small objects, writing, eating, typing, buttoning, etc.). ** Make each movement big and deliberate; feel the movement.  PWR! UP: Fists to open fingers BIG  PWR! Rock:  Move wrists up and down [employer]! Twist: Twist palms up and down BIG  PWR! Step: Step your thumb to index finger while keeping other fingers straight. Flick fingers out BIG (thumb out/straighten fingers). Repeat with other fingers.  PWR! PUSH: Push hands out BIG. Elbows straight, wrists up, fingers open and spread apart BIG.

## 2023-11-02 NOTE — Therapy (Signed)
 OUTPATIENT PHYSICAL THERAPY NEURO TREATMENT   Patient Name: [name] MRN: [id number] DOB:08/15/1950, 73 y.o., male Today's Date: 11/02/2023   PCP: Ronnald Nian, MD REFERRING PROVIDER: Ihor Austin, NP  END OF SESSION:  PT End of Session - 11/02/23 1325     Visit Number 8    Number of Visits 13    Date for PT Re-Evaluation 11/16/23    Authorization Type [employer] Medicare    PT Start Time 1321    PT Stop Time 1400    PT Time Calculation (min) 39 min    Equipment Utilized During Treatment Gait belt    Activity Tolerance Patient tolerated treatment well;Other (comment)   Orthostatic   Behavior During Therapy WFL for tasks assessed/performed             Past Medical History:  Diagnosis Date   Allergy    seasonal   Arthritis    BPH (benign prostatic hyperplasia)    Complication of anesthesia    Pt. stated he had a reaction that ended in him requiring urinary cath placement; hx delirium worsening parkinson symptoms   Diverticulosis    GERD (gastroesophageal reflux disease)    esophageal spasms   Glaucoma    Gout    Head injury, closed, with concussion    Hepatitis C    chronic - Has been treated with Harvoni   HLD (hyperlipidemia)    statin intolerant (Crestor & Simvastatin) - Taking Livalo 1mg  / week   Hypertension    Parkinson's disease (HCC)    Plantar fasciitis    right   PVD (peripheral vascular disease) (HCC)    With no claudication; only mild abdominal aortic atherosclerosis noted on ultrasound.   Spinal stenosis of lumbar region    Thoracic ascending aortic aneurysm (HCC)    4.2 cm ascending TAA 09/2016 CT, 1 yr f/u rec   Ulcer    Past Surgical History:  Procedure Laterality Date   BUNIONECTOMY WITH WEIL OSTEOTOMY Right 11/02/2019   Procedure: Right Foot Lapidus, Modified McBride Bunionectomy,  Hallux Akin Osteotomy;  Surgeon: Toni Arthurs, MD;  Location: [facility];  Service: Orthopedics;  Laterality: Right;   CARDIAC  CATHETERIZATION  2005   30% Cx. Dr. Elsie Lincoln   cataract surgery Left 11/05/2014   COLONOSCOPY     COLONOSCOPY N/A 01/09/2021   Procedure: COLONOSCOPY;  Surgeon: Kerin Salen, MD;  Location: [facility];  Service: Gastroenterology;  Laterality: N/A;   ESOPHAGOGASTRODUODENOSCOPY N/A 05/02/2015   Procedure: ESOPHAGOGASTRODUODENOSCOPY (EGD);  Surgeon: Bernette Redbird, MD;  Location: [facility];  Service: Endoscopy;  Laterality: N/A;   ESOPHAGOGASTRODUODENOSCOPY  05/02/2015   no source of pt chest pain endoscopically evident. small hiatal hernia.   ESOPHAGOGASTRODUODENOSCOPY (EGD) WITH PROPOFOL N/A 01/02/2021   Procedure: ESOPHAGOGASTRODUODENOSCOPY (EGD) WITH PROPOFOL;  Surgeon: Vida Rigger, MD;  Location: [facility];  Service: Endoscopy;  Laterality: N/A;   EYE SURGERY     HARDWARE REMOVAL Right 11/02/2019   Procedure: Second Metatarsal Removal of Deep Implant and Rotational Osteotomy;  Surgeon: Toni Arthurs, MD;  Location: [facility];  Service: Orthopedics;  Laterality: Right;   IR ANGIOGRAM SELECTIVE EACH ADDITIONAL VESSEL  01/04/2021   IR ANGIOGRAM SELECTIVE EACH ADDITIONAL VESSEL  01/04/2021   IR ANGIOGRAM VISCERAL SELECTIVE  01/04/2021   IR US GUIDE VASC ACCESS RIGHT  01/04/2021   LUMBAR LAMINECTOMY/DECOMPRESSION MICRODISCECTOMY N/A 07/03/2021   Procedure: Lumbar three through five decompression with lumbar three through five insitu fusion;  Surgeon: Venita Lick, MD;  Location: [facility];  Service: Orthopedics;  Laterality: N/A;   MEMBRANE PEEL Left 03/14/2014   Procedure: MEMBRANE PEEL; ENDOLASER;  Surgeon: Edmon Crape, MD;  Location: [facility];  Service: Ophthalmology;  Laterality: Left;   NM MYOVIEW LTD  10/2015   LOW RISK. Small, fixed basal lateral defect - likely diaphragmatic attenuation. EF 69%   PARS PLANA VITRECTOMY Left 03/14/2014   Procedure: PARS PLANA VITRECTOMY WITH 25 GAUGE;  Surgeon: Edmon Crape, MD;  Location: [facility];  Service: Ophthalmology;  Laterality:  Left;   shave  02/03/2022   angiofibroma   TONSILLECTOMY     TRANSTHORACIC ECHOCARDIOGRAM  01/07/2021   Normal EF 60 to 65%.  No R WMA.  GRII DD-moderately dilated LA..  Mildly dilated RV but normal function.  Normal RAP/CVP.  Trivial AI with mild to moderate sclerosis-no stenosis   UMBILICAL HERNIA REPAIR  2023   UPPER GASTROINTESTINAL ENDOSCOPY     WEIL OSTEOTOMY Right 09/01/2017   Procedure: RIGHT GREAT TOE CHEVRON AND WEIL OSTEOTOMY 2ND METATARSAL;  Surgeon: Nadara Mustard, MD;  Location: [facility];  Service: Orthopedics;  Laterality: Right;   XI ROBOTIC ASSISTED VENTRAL HERNIA N/A 03/17/2022   Procedure: XI ROBOTIC ASSISTED VENTRAL HERNIA;  Surgeon: Henrene Dodge, MD;  Location: [facility];  Service: General;  Laterality: N/A;   Patient Active Problem List   Diagnosis Date Noted   Ventral hernia without obstruction or gangrene    Primary open angle glaucoma of left eye, mild stage 06/23/2021   Facial numbness 12/24/2020   Neovascular glaucoma, right eye, stage unspecified 12/16/2020   Lumbar radiculopathy 11/25/2020   Gastroesophageal reflux disease 09/10/2020   Dry eyes, bilateral 08/15/2020   Presbycusis of right ear 01/17/2020   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   Secondary corneal edema of right eye 12/12/2019   Ptosis of right eyelid 12/12/2019   OAB (overactive bladder) 11/29/2019   Bunion of great toe of right foot 07/14/2017   Claw toe, acquired, right 07/14/2017   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   Medication management 10/09/2016   Chest pain at rest 07/04/2015   Plantar fasciitis of right foot 02/19/2015   Depression 08/14/2014   Family history of heart disease in male family member before age 77 04/26/2014   Mild cognitive impairment 03/13/2014   Parkinsonian tremor (HCC) 02/12/2014   Hyperlipidemia with target LDL less than 100    Abdominal aortic atherosclerosis (HCC)    Moderate  essential hypertension 02/25/2011   Arthropathy 02/25/2011   HAV (hallux abducto valgus) 02/25/2011    ONSET DATE: 09/08/2023 (referral)   REFERRING DIAG: G20.C (ICD-10-CM) - Primary parkinsonism (HCC)  THERAPY DIAG:  Unsteadiness on feet  Other abnormalities of gait and mobility  Muscle weakness (generalized)  Rationale for Evaluation and Treatment: Rehabilitation  SUBJECTIVE:  SUBJECTIVE STATEMENT: Pt reports increase in back pain today. Still contemplating getting a RW, does not want to use an AD. Denies falls or near misses. "I need to be better about doing my exercises". Has had a few near misses, mostly when turning or moving too quickly. BP was low when he checked it about an hour ago, states it was 80/50. Drank a lot of water.   Pt accompanied by: self   PERTINENT HISTORY:  tremor (2014), parkinsonism, allergies, arthritis, reflux disease, glaucoma, gout, hepatitis C, hyperlipidemia, hypertension, plantar fasciitis, peripheral vascular disease, and BPH, admitted 12/8 for L3-5 spinal fusion   PAIN:  Are you having pain? Yes: NPRS scale: 6/10 Pain location: back pain Pain description: achy Aggravating factors: unknown Relieving factors: sitting down or resting, taking Tylenol or ibuprofen   PRECAUTIONS: Fall and Other: blind in R eye  RED FLAGS: None   WEIGHT BEARING RESTRICTIONS: No  FALLS: Has patient fallen in last 6 months? No and reports frequent near misses   LIVING ENVIRONMENT: Lives with: lives with their spouse Lives in: House/apartment Stairs: Yes: Internal: 2 flights steps; on left going up and External: 1 in front and 3 in side steps; none Has following equipment at home: Single point cane, Walker - 2 wheeled, shower chair, and Grab bars  PLOF: Needs assistance with  ADLs and Needs assistance with homemaking  PATIENT GOALS: "Just wanting to have as much control as I can"   OBJECTIVE:  Note: Objective measures were completed at Evaluation unless otherwise noted.  DIAGNOSTIC FINDINGS: CT of R foot from 2021    IMPRESSION: 1. Interval surgical arthrodesis at the 1st tarsometatarsal articulation. There is no solid osseous ankylosis across the joint, and there is intraosseous cyst formation dorsally. 2. Interval placement of a cortical screw in the medial base of the 1st proximal phalanx without hardware loosening or associated osseous abnormality. 3. Stable deformity of the 1st metatarsal head and neck consistent with previous osteotomy. Stable chronic Freiberg infraction and postsurgical changes in the 2nd metatarsal head. 4. Stable additional mild degenerative changes. No acute findings or significant joint effusions.  COGNITION: Overall cognitive status: Impaired   SENSATION: Pt reports occasional numbness/tingling in BLEs    MUSCLE TONE: LLE: Rigidity   POSTURE: rounded shoulders, forward head, increased thoracic kyphosis, flexed trunk , and weight shift left  LOWER EXTREMITY ROM:     Active  Right Eval Left Eval  Hip flexion    Hip extension    Hip abduction    Hip adduction    Hip internal rotation    Hip external rotation    Knee flexion    Knee extension    Ankle dorsiflexion    Ankle plantarflexion    Ankle inversion    Ankle eversion     (Blank rows = not tested)  LOWER EXTREMITY MMT:    MMT Right Eval Left Eval  Hip flexion    Hip extension    Hip abduction    Hip adduction    Hip internal rotation    Hip external rotation    Knee flexion    Knee extension    Ankle dorsiflexion    Ankle plantarflexion    Ankle inversion    Ankle eversion    (Blank rows = not tested)  BED MOBILITY:  Pt reports independence w/this but it is difficult due to elevated height of the bed and foot pain    TRANSFERS: Assistive device utilized: None  Sit to stand: SBA Stand  to sit: SBA Chair to chair: SBA Pt demonstrates laterally instability to R side, wide BOS, decreased eccentric control   GAIT: Gait pattern: step through pattern, decreased arm swing- Left, decreased stride length, lateral hip instability, decreased trunk rotation, trunk flexed, and wide BOS Distance walked: Various clinic distances  Assistive device utilized: None Level of assistance: SBA and CGA Comments: Pt maintains L hand in fist. Wide BOS w/decreased lateral weight shift. Pt lost balance posteriorly w/turn, requiring CGA for safety   VITALS  Vitals:   11/02/23 1334 11/02/23 1335  BP: (!) 142/92 111/77  Pulse: 61 63                                                                                                            TREATMENT:   Self-care/home management   Assessed BP in seated and standing position and BP orthostatic this date w/moderate lateral sway noted in standing. Pt wearing compression socks (he did not sleep in them) and states he has been keeping a log of his BP at home. Noted message sent to Dr. Susann Givens on 3/25 regarding low BP, but readings were not sent to him. Encouraged pt to reach back out to Dr. Susann Givens including his BP readings and report of symptoms. Pt verbalized understanding.  Discussed rationale behind need for RW and emphasized pt is a high fall risk due to PD, impaired vision, neuropathy and now orthostatics. Pt reports he is realizing how "messed up" he is and understands why an AD may be needed. Informed pt that an AD will boost his independence and lower his fear of falling, ultimately allowing him to do more activity. Pt reports understanding.    Ther Act  SciFit multi-peaks level 8 for 8 minutes using BUE/BLEs for neural priming for reciprocal movement, dynamic cardiovascular warmup and increased amplitude of stepping. RPE of 5/10 following activity  Reassessed BP at end of  activity and vitals as follows: BP 137/79 mmHg, HR 67 bpm   PATIENT EDUCATION: Education details: See self-care + continue HEP Person educated: Patient Education method: Explanation and Demonstration Education comprehension: verbalized understanding and needs further education  HOME EXERCISE PROGRAM: Access Code: [email or web address] URL: [email or web address] Date: 10/05/2023 Prepared by: Alethia Berthold Naly Schwanz  Exercises - Seated Windmill Trunk Rotation Stretch  - 1 x daily - 7 x weekly - 3 sets - 10 reps - Standing Reach to Opposite Side with Weight Shift  - 1 x daily - 7 x weekly - 3 sets - 10 reps  *PWR! Moves discontinued as too cognitively challenging for patient at this time  GOALS: Goals reviewed with patient? Yes  SHORT TERM GOALS: Target date: 10/26/2023   Pt will be independent with initial HEP for improved strength, balance, transfers and gait.  Baseline: not established on eval, reports confidence with initial HEP Goal status: MET  2.  MiniBest to be assessed and LTG updated  Baseline:  Goal status: MET  3.  Pt and wife will verbalize and demonstrate freezing strategies to implement at home and in community for reduced fall risk  Baseline:  Goal status: INITIAL  4.  MCTSIB to be assessed and LTG updated Baseline:  Goal status: MET    LONG TERM GOALS: Target date: 11/09/2023   Pt will verbalize understanding of local PD community resources, including fitness post DC.   Baseline:  Goal status: INITIAL  2. Pt will improve MiniBest to 23/28 for decreased fall risk and improvement with compensatory stepping strategies.   Baseline: 19/28 Goal status: REVISED   3.  Pt will improve 5 x STS to less than or equal to 15 seconds w/o UE support and proper body mechanics to demonstrate improved functional strength and transfer efficiency.   Baseline: 19.55s no UE support, weight shift to R Goal status: INITIAL  4.  Pt will perform floor transfer w/SBA for  improved fall recovery and safety  Baseline:  Goal status: INITIAL  5.  MCTSIB goal Baseline: 120/120 Goal status: DC due to high baseline score    ASSESSMENT:  CLINICAL IMPRESSION: Emphasis of skilled physical therapy session on pt education regarding orthostatics, safety w/AD and contacting PCP regarding BP. Pt reports his BP was low at his house (~80/50 mmHg) and so he drank a lot of water. BP more elevated at beginning of session but did drop in stance w/pt symptomatic. Encouraged pt to reach back out to PCP regarding low BP readings and include numbers in his message. Pt verbalized understanding.  Will continue per POC.   OBJECTIVE IMPAIRMENTS: Abnormal gait, decreased activity tolerance, decreased balance, decreased cognition, decreased coordination, decreased knowledge of condition, decreased knowledge of use of DME, decreased mobility, difficulty walking, decreased strength, decreased safety awareness, impaired sensation, impaired UE functional use, impaired vision/preception, improper body mechanics, and pain.   ACTIVITY LIMITATIONS: carrying, lifting, bending, standing, squatting, stairs, transfers, bed mobility, bathing, dressing, hygiene/grooming, and locomotion level  PARTICIPATION LIMITATIONS: meal prep, cleaning, laundry, medication management, driving, shopping, community activity, and yard work  PERSONAL FACTORS: Age, Fitness, Past/current experiences, and 1-2 comorbidities: PD, blindness in R eye  are also affecting patient's functional outcome.   REHAB POTENTIAL: Fair due to impaired vision, PD  CLINICAL DECISION MAKING: Evolving/moderate complexity  EVALUATION COMPLEXITY: Moderate  PLAN:  PT FREQUENCY: 1-2x/week  PT DURATION: 6 weeks  PLANNED INTERVENTIONS: 97164- PT Re-evaluation, 97110-Therapeutic exercises, 97530- Therapeutic activity, [id number]- Neuromuscular re-education, 97535- Self Care, [id number]- Manual therapy, [phone]- Gait training, [phone]- Orthotic  Fit/training, [phone]- Canalith repositioning, [id number]- Aquatic Therapy, [phone]- Electrical stimulation (manual), Patient/Family education, Balance training, Stair training, Dry Needling, Joint mobilization, Vestibular training, and DME instructions  PLAN FOR NEXT SESSION:  Assess BP and watch for orthostatics. Add to HEP to work on turns, truncal mobility and compensation strategies for impaired peripheral vision, lateral weight shift to L, posterior stepping strategy and balance reactions when looking over shoulder   Review safety with rollator and if still appropriate get for home, review freezing strategies with patient for STG #2  Jill Alexanders Hesper Venturella, PT, DPT 11/02/2023, 2:03 PM

## 2023-11-02 NOTE — Therapy (Signed)
 OUTPATIENT OCCUPATIONAL THERAPY PARKINSON'S TREATMENT  Patient Name: [name] MRN: [id number] DOB:20-Sep-1950, 73 y.o., male Today's Date: 11/02/2023  PCP: Ronnald Nian, MD REFERRING PROVIDER: Ihor Austin, NP  END OF SESSION:  OT End of Session - 11/02/23 1404     Visit Number 8    Number of Visits 13   including eval/tx   Date for OT Re-Evaluation 11/24/23    Authorization Type [employer] Medicare 2025    Authorization Time Period Auth# [id number] 09/28/23 to 11/09/23 Parkinsons [employer] 2025    Authorization - Number of Visits 13    Progress Note Due on Visit 10    OT Start Time 1403    OT Stop Time 1445    OT Time Calculation (min) 42 min    Equipment Utilized During Treatment --    Activity Tolerance Patient tolerated treatment well    Behavior During Therapy WFL for tasks assessed/performed              Past Medical History:  Diagnosis Date   Allergy    seasonal   Arthritis    BPH (benign prostatic hyperplasia)    Complication of anesthesia    Pt. stated he had a reaction that ended in him requiring urinary cath placement; hx delirium worsening parkinson symptoms   Diverticulosis    GERD (gastroesophageal reflux disease)    esophageal spasms   Glaucoma    Gout    Head injury, closed, with concussion    Hepatitis C    chronic - Has been treated with Harvoni   HLD (hyperlipidemia)    statin intolerant (Crestor & Simvastatin) - Taking Livalo 1mg  / week   Hypertension    Parkinson's disease (HCC)    Plantar fasciitis    right   PVD (peripheral vascular disease) (HCC)    With no claudication; only mild abdominal aortic atherosclerosis noted on ultrasound.   Spinal stenosis of lumbar region    Thoracic ascending aortic aneurysm (HCC)    4.2 cm ascending TAA 09/2016 CT, 1 yr f/u rec   Ulcer    Past Surgical History:  Procedure Laterality Date   BUNIONECTOMY WITH WEIL OSTEOTOMY Right 11/02/2019   Procedure: Right Foot Lapidus, Modified McBride  Bunionectomy,  Hallux Akin Osteotomy;  Surgeon: Toni Arthurs, MD;  Location: [facility];  Service: Orthopedics;  Laterality: Right;   CARDIAC CATHETERIZATION  2005   30% Cx. Dr. Elsie Lincoln   cataract surgery Left 11/05/2014   COLONOSCOPY     COLONOSCOPY N/A 01/09/2021   Procedure: COLONOSCOPY;  Surgeon: Kerin Salen, MD;  Location: [facility];  Service: Gastroenterology;  Laterality: N/A;   ESOPHAGOGASTRODUODENOSCOPY N/A 05/02/2015   Procedure: ESOPHAGOGASTRODUODENOSCOPY (EGD);  Surgeon: Bernette Redbird, MD;  Location: [facility];  Service: Endoscopy;  Laterality: N/A;   ESOPHAGOGASTRODUODENOSCOPY  05/02/2015   no source of pt chest pain endoscopically evident. small hiatal hernia.   ESOPHAGOGASTRODUODENOSCOPY (EGD) WITH PROPOFOL N/A 01/02/2021   Procedure: ESOPHAGOGASTRODUODENOSCOPY (EGD) WITH PROPOFOL;  Surgeon: Vida Rigger, MD;  Location: [facility];  Service: Endoscopy;  Laterality: N/A;   EYE SURGERY     HARDWARE REMOVAL Right 11/02/2019   Procedure: Second Metatarsal Removal of Deep Implant and Rotational Osteotomy;  Surgeon: Toni Arthurs, MD;  Location: [facility];  Service: Orthopedics;  Laterality: Right;   IR ANGIOGRAM SELECTIVE EACH ADDITIONAL VESSEL  01/04/2021   IR ANGIOGRAM SELECTIVE EACH ADDITIONAL VESSEL  01/04/2021   IR ANGIOGRAM VISCERAL SELECTIVE  01/04/2021   IR US GUIDE VASC  ACCESS RIGHT  01/04/2021   LUMBAR LAMINECTOMY/DECOMPRESSION MICRODISCECTOMY N/A 07/03/2021   Procedure: Lumbar three through five decompression with lumbar three through five insitu fusion;  Surgeon: Venita Lick, MD;  Location: [facility] OR;  Service: Orthopedics;  Laterality: N/A;   MEMBRANE PEEL Left 03/14/2014   Procedure: MEMBRANE PEEL; ENDOLASER;  Surgeon: Edmon Crape, MD;  Location: [facility] OR;  Service: Ophthalmology;  Laterality: Left;   NM MYOVIEW LTD  10/2015   LOW RISK. Small, fixed basal lateral defect - likely diaphragmatic attenuation. 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  No R WMA.  GRII DD-moderately dilated LA..  Mildly dilated RV but normal function.  Normal RAP/CVP.  Trivial AI with mild to moderate sclerosis-no stenosis   UMBILICAL HERNIA REPAIR  2023   UPPER GASTROINTESTINAL ENDOSCOPY     WEIL OSTEOTOMY Right 09/01/2017   Procedure: RIGHT GREAT TOE CHEVRON AND WEIL OSTEOTOMY 2ND METATARSAL;  Surgeon: Nadara Mustard, MD;  Location: [facility] OR;  Service: Orthopedics;  Laterality: Right;   XI ROBOTIC ASSISTED VENTRAL HERNIA N/A 03/17/2022   Procedure: XI ROBOTIC ASSISTED VENTRAL HERNIA;  Surgeon: Henrene Dodge, MD;  Location: [facility]S;  Service: General;  Laterality: N/A;   Patient Active Problem List   Diagnosis Date Noted   Ventral hernia without obstruction or gangrene    Primary open angle glaucoma of left eye, mild stage 06/23/2021   Facial numbness 12/24/2020   Neovascular glaucoma, right eye, stage unspecified 12/16/2020   Lumbar radiculopathy 11/25/2020   Gastroesophageal reflux disease 09/10/2020   Dry eyes, bilateral 08/15/2020   Presbycusis of right ear 01/17/2020   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   Secondary corneal edema of right eye 12/12/2019   Ptosis of right eyelid 12/12/2019   OAB (overactive bladder) 11/29/2019   Bunion of great toe of right foot 07/14/2017   Claw toe, acquired, right 07/14/2017   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   Medication management 10/09/2016   Chest pain at rest 07/04/2015   Plantar fasciitis of right foot 02/19/2015   Depression 08/14/2014   Family history of heart disease in male family member before age 44 04/26/2014   Mild cognitive  impairment 03/13/2014   Parkinsonian tremor (HCC) 02/12/2014   Hyperlipidemia with target LDL less than 100    Abdominal aortic atherosclerosis (HCC)    Moderate essential hypertension 02/25/2011   Arthropathy 02/25/2011   HAV (hallux abducto valgus) 02/25/2011   ONSET DATE: 09/20/2023  REFERRING DIAG: G20.C (ICD-10-CM) - Parkinsonism, unspecified  THERAPY DIAG:  Muscle weakness (generalized)  Other lack of coordination  Tremor  Other symptoms and signs involving the nervous system  Visuospatial deficit  Attention and concentration deficit  Frontal lobe and executive function deficit  Rationale for Evaluation and Treatment: Rehabilitation  SUBJECTIVE:   SUBJECTIVE STATEMENT:  Pt accompanied by: self  Pt reports he has been having difficulty getting used to phone modifications though is generally better able to read text on his phone. He has not been able to use his flashlight as easily as he would like.   PERTINENT HISTORY: Tremor (2015), parkinsonism, allergies, arthritis, reflux disease, glaucoma, gout, hepatitis C,  hyperlipidemia, hypertension, plantar fasciitis, peripheral vascular disease, and BPH, admitted 07/03/2021 for L3-5 spinal fusion Back, foot, hernia, neuro surgery (pains)  PRECAUTIONS: Fall and Other: Vision loss R eye  WEIGHT BEARING RESTRICTIONS: No  PAIN: Pain in back  Are you having pain? Yes: NPRS scale: at worst 6-7/10 Pain location: Lower back (every day) and some down right leg Pain description:  can be sharp pains, often just aching  Aggravating factors: Moving, bending, lifting Relieving factors: Ibuprofen, acetaminophen, resting, heating pad  FALLS: Has patient fallen in last 6 months? No - near misses - ie) getting out of the chair  LIVING ENVIRONMENT: Lives with: lives with their spouse Lives in: House/apartment Stairs: Yes: Internal: 14+ steps; on left going up and External: 2-3 steps; can reach both Has following equipment at home:  Single point cane, Walker - 2 wheeled, shower chair, Grab bars, and elevated toilet seat, walk in shower with removable seat - not using it at this time  PLOF: Independent with basic ADLs, Independent with community mobility without device, Needs assistance with homemaking, and Not driving  PATIENT GOALS: Pt reported that he wanted to be able to improve memory/recall, regain/improve balance ie) to minimize getting frozen, keep up/improve strength ie) to open doors for wife, to be able to take a container off the shelf, get the bottles of water from the car etc.  OBJECTIVE:  Note: Objective measures were completed at Evaluation unless otherwise noted.  HAND DOMINANCE: Right  ADLs: Overall ADLs: Occasional support from spouse Transfers/ambulation related to ADLs: Ind/Mod I Eating: Pt can cut some foods himself but may get help Grooming: Pt shaves himself - uses regular razor UB Dressing: Ind - may get help to pull his jacket off LB Dressing: Ind Toileting: Ind - uses regular undergarments, but has used pull-ups at night but is not using them at this time Bathing: Showers a couple of times/week and stands in the shower despite having a shower stool available Tub Shower transfers: Mod I - has grab bars Equipment: Shower seat without back, Grab bars, Walk in shower, and Reacher  IADLs: Not driving due to vision trouble - ie) limited peripheral vision Shopping: Goes with spouse Light housekeeping: Pt reports helping to wash dishes Meal Prep: Helps spouse but is slower getting things done.  He likes to cook breakfast. Community mobility: Ind without AE Medication management: Spouse helps take care of meds Financial management: Spouse does this  Handwriting: 50-75% legible - By pt report "it sucks."  Pt had improved legibility with printing verus writing.  MOBILITY STATUS: Independent and remote h/o falls  POSTURE COMMENTS:  rounded shoulders, forward head, increased thoracic kyphosis,  flexed trunk , and weight shift left   ACTIVITY TOLERANCE: Activity tolerance: Good  FUNCTIONAL OUTCOME MEASURES: Fastening/unfastening 3 buttons: 46.24 sec Physical performance test: PPT#2 (simulated eating) 16.96 & PPT#4 (donning/doffing jacket): 28.58 sec  COORDINATION: 9 Hole Peg test: Right: 31.54 sec; Left: 34.65 sec Box and Blocks:  Right 38 blocks, Left 38 blocks  UE ROM:  WFL - slight end range limitations  UE MMT:   WFL - 4-/5 Grip Strength:  Right: 53.1, 56.4, 55.7 - Average 55.1 lbs Left:  42.7, 51.3, 48.2 - Average 47.4 lbs  SENSATION: Pt reports his fingertips get cold and "turn blue", (not thumbs)   MUSCLE TONE: WFL  COGNITION: Overall cognitive status:  slower processing by report ie) gets frozen and asks for frequent feedback from spouse for memory  PD OBSERVATIONS: Dyskinesias, Postural tremors, and pt  and spouse report some freezing   VISION: Pt has history of glaucoma and some eye injections with loss of peripheral vision, R eye limitations and confirms that he has used magnify glass.  Pt has Iphone.                                                                                                                    TODAY'S TREATMENT:   Therapeutic Exercises:   OT initiated PWR! Hands as noted in pt instructions to improve BUE ROM and pain while reducing dyskinesias.    Also printed and briefly reviewed PWR! Sitting as noted in pt instructions as pt had exercise from PT already, which is consistent with PWR! Rock.   Self Care:  OT reviewed PD binder and re-arranged for better carryover/use.   OT reviewed phone use and instructed pt on how to locate flashlight (hit R side button, tap screen, locate flashlight icon at bottom left of screen). Pt required increased time and cueing for completion.     PATIENT EDUCATION: Education details: PWR! HEPs; vision strategies; binder use Person educated: Patient Education method: Explanation, Demonstration, Tactile  cues, Verbal cues, and Handouts Education comprehension: verbalized understanding, returned demonstration, verbal cues required, and needs further education  HOME EXERCISE PROGRAM: 10/19/23: Visual Compensatory Strategies  10/28/23: Bag exercises 11/02/2023: PWR! Hands and sitting  GOALS: Goals reviewed with patient? Yes  SHORT TERM GOALS: Target date: 10/22/23  Pt will be independent with PD specific HEP. Baseline: not yet initiated Goal status: IN Progress  2.  Pt will independently recall and demonstrate at least 3 compensatory strategies/adaptations for visual impairment/memory aide without cueing including technology options with IPhone  Baseline: New to outpt OT - h/o glaucoma, has used magnify glass Goal status: IN Progress  3.  Pt will improve confidence with using his own strength ie) to open doors for wife, to be able to take a container off the shelf, and carry items from the car  Baseline: UE strength: 4-/5; Grip: Right: 55.1 lbs Left: 47.4 lbs Goal status: IN Progress  4.  Pt will write a grocery list x 10 words with no significant decrease in size and maintain 75% legibility. Baseline: 50-75% legibility Goal status: IN Progress  5.  Pt will demonstrate fine motor coordination for ADLs as evidenced by no decline and/or improvement in 9 hole peg test score and Box and Blocks Test for BUE. Baseline: 9 Hole: Right: 31.54 sec; Left: 34.65 sec  Box and Blocks: Right 38 blocks, Left 38 blocks Goal status: IN Progress   LONG TERM GOALS: Target date: 12/21/2023    Pt will verbalize understanding of ways to prevent future PD related complications and PD community resources. Baseline: not yet initiated Goal status: IN Progress  2.  Pt will verbalize understanding of ways to keep thinking skills sharp/WARM strategies and ways to compensate for STM changes now and in the future, including use of Memory Notebook. Baseline: not yet initiated Goal status: IN Progress  3.  Pt will  verbalize understanding of adapted strategies to maximize safety, independence and timeliness with ADLs/IADLs. Baseline: Physical performance test: PPT#2 (simulated eating) 16.96 & PPT#4 (donning/doffing jacket): 28.58 sec Goal status: IN Progress  4.  Pt will demonstrate improved FM skills for max ease with fastening buttons as evidenced by decreasing 3 button/unbutton time by 5+ seconds Baseline: 46.24 secs Goal status: IN Progress    ASSESSMENT:  CLINICAL IMPRESSION: Patient demonstrating improved understanding of HEP and recommendations as needed to progress towards goals. Will likely require extension of services as needed to get to max rehab potential especially given the extent of his functional limitations.   PERFORMANCE DEFICITS: in functional skills including ADLs, IADLs, coordination, dexterity, proprioception, sensation, tone, ROM, strength, pain, flexibility, Fine motor control, Gross motor control, mobility, balance, body mechanics, endurance, continence, decreased knowledge of precautions, decreased knowledge of use of DME, vision, and UE functional use, cognitive skills including attention, energy/drive, learn, memory, problem solving, safety awareness, thought, and understand, and psychosocial skills including coping strategies, environmental adaptation, and routines and behaviors.   IMPAIRMENTS: are limiting patient from ADLs, IADLs, and leisure.   COMORBIDITIES:  has co-morbidities such as glaucoma, history of back and foot surgery, HTN  that affects occupational performance. Patient will benefit from skilled OT to address above impairments and improve overall function.  REHAB POTENTIAL: Good   PLAN:  OT FREQUENCY: 2x/week  OT DURATION: 6 weeks  PLANNED INTERVENTIONS: 97168 OT Re-evaluation, 97535 self care/ADL training, 95638 therapeutic exercise, 97530 therapeutic activity, 97112 neuromuscular re-education, [phone] aquatic therapy, balance training, functional mobility  training, visual/perceptual remediation/compensation, psychosocial skills training, energy conservation, coping strategies training, patient/family education, and DME and/or AE instructions  RECOMMENDED OTHER SERVICES:   CONSULTED AND AGREED WITH PLAN OF CARE: Patient and family member/caregiver  PLAN FOR NEXT SESSION:  Golf solitaire - dominoes  Review Visual comp strategies provided 3/25 and add vision HEP  - review phone modifications: flashlight, magnifier, location of apps  Reprint Exercises for better visibility?  Progress HEP training - strength, coordination to increase confidence with using his own strength WARM memory strategies (potentially needs handout) - provide handouts in page protectors Memory Note Book - or Notes on phone - Sees ST for eval on 4/15 ADL comp strategies Tremor handouts Add Bag activities with can/weight as appropriate and add following activity - Hold bag in one hand. Toss bag up and catch with the same/opposite hand.   Recheck coordination testing Auth through 11/09/23   Delana Meyer, OT 11/02/2023, 4:46 PM

## 2023-11-03 ENCOUNTER — Ambulatory Visit: Payer: Self-pay | Admitting: Licensed Clinical Social Worker

## 2023-11-03 ENCOUNTER — Telehealth (INDEPENDENT_AMBULATORY_CARE_PROVIDER_SITE_OTHER): Admitting: Family Medicine

## 2023-11-03 DIAGNOSIS — R2689 Other abnormalities of gait and mobility: Secondary | ICD-10-CM

## 2023-11-03 DIAGNOSIS — M5416 Radiculopathy, lumbar region: Secondary | ICD-10-CM

## 2023-11-03 NOTE — Patient Outreach (Signed)
 Complex Care Management   Visit Note  11/03/2023  Name:  [name] MRN: [id number] DOB: 1951/04/29  Situation: Referral received for Complex Care Management related to  Hearing aid assistance  I obtained verbal consent from patinet and patients wife.  Visit completed with patient and wife  on the phone  Background:   Past Medical History:  Diagnosis Date   Allergy    seasonal   Arthritis    BPH (benign prostatic hyperplasia)    Complication of anesthesia    Pt. stated he had a reaction that ended in him requiring urinary cath placement; hx delirium worsening parkinson symptoms   Diverticulosis    GERD (gastroesophageal reflux disease)    esophageal spasms   Glaucoma    Gout    Head injury, closed, with concussion    Hepatitis C    chronic - Has been treated with Harvoni   HLD (hyperlipidemia)    statin intolerant (Crestor & Simvastatin) - Taking Livalo 1mg  / week   Hypertension    Parkinson's disease (HCC)    Plantar fasciitis    right   PVD (peripheral vascular disease) (HCC)    With no claudication; only mild abdominal aortic atherosclerosis noted on ultrasound.   Spinal stenosis of lumbar region    Thoracic ascending aortic aneurysm (HCC)    4.2 cm ascending TAA 09/2016 CT, 1 yr f/u rec   Ulcer     Assessment: Patient Reported Symptoms:  Cognitive    Neurological      HEENT      Cardiovascular      Respiratory      Endocrine      Gastrointestinal      Genitourinary      Integumentary      Musculoskeletal      Psychosocial       There were no vitals filed for this visit.  Medications Reviewed Today   Medications were not reviewed in this encounter     Recommendation:   DME requests:  other hearing aid  Follow Up Plan:   Telephone follow up appointment date/time:  11/17/2023 at 10:00 am  Jeanie Cooks, PhD [facility], [facility] Social Worker Direct Dial: [phone]  Fax: [phone]

## 2023-11-03 NOTE — Patient Instructions (Signed)
 Visit Information  Thank you for taking time to visit with me today. Please don't hesitate to contact me if I can be of assistance to you before our next scheduled appointment.  Our next appointment is by telephone on 11/17/2023 at 10:00 am Please call the care guide team at [phone] if you need to cancel or reschedule your appointment.   Following is a copy of your care plan:   Goals Addressed             This Visit's Progress    VBC[facility] Social Work Care Plan       Problems:   Patient is in need of 2 hearing aids and the cost is about $7000 with insurance only paying about $2000  CSW Clinical Goal(s):   Over the next 2 weeks the Caregiver Patient will will follow up with [facility] list provided to look at the resources to assist with hearing aids as directed by Social Work.  Interventions:  The SW provided the resources for [facility]   Patient Goals/Self-Care Activities:  Review educational material on Hearing aid assistance.  Plan:   11/17/2023 at 10:00 am        Please call the Suicide and Crisis Lifeline: 988 go to [facility] [address] [place] [phone]) call 911 if you are experiencing a Mental Health or Behavioral Health Crisis or need someone to talk to.  Patient verbalizes understanding of instructions and care plan provided today and agrees to view in MyChart. Active MyChart status and patient understanding of how to access instructions and care plan via MyChart confirmed with patient.     Jeanie Cooks, PhD [facility], [facility] Social Worker Direct Dial: [phone]  Fax: [phone]

## 2023-11-03 NOTE — Progress Notes (Signed)
   Subjective:    Patient ID: [name], male    DOB: July 25, 1951, 73 y.o.   MRN: [id number]  HPI Documentation for virtual audio and video telecommunications through Caregility encounter:  The patient was located at home. 2 patient identifiers used.  The provider was located in the office. The patient did consent to this visit and is aware of possible charges through their insurance for this visit.  The other persons participating in this telemedicine service was his wife time spent on call was 5 minutes and in review of previous records >20 minutes total for counseling and coordination of care.  This virtual service is not related to other E/M service within previous 7 days.  He has been having a great deal of difficulty dealing with back pain, balance issues and walking.  He is in the process of getting evaluated by neurosurgery for another MRI and more procedure.  Walking for short distances is difficult for him to do.   Review of Systems     Objective:    Physical Exam Alert and in no distress otherwise not examined       Assessment & Plan:  Lumbar radiculopathy  Balance problem It is reasonable to fill out a form for disability placard.

## 2023-11-04 ENCOUNTER — Encounter: Payer: Self-pay | Admitting: Physical Therapy

## 2023-11-04 ENCOUNTER — Ambulatory Visit: Admitting: Occupational Therapy

## 2023-11-04 ENCOUNTER — Ambulatory Visit: Admitting: Physical Therapy

## 2023-11-04 VITALS — BP 141/83 | HR 55

## 2023-11-04 DIAGNOSIS — R41840 Attention and concentration deficit: Secondary | ICD-10-CM

## 2023-11-04 DIAGNOSIS — F801 Expressive language disorder: Secondary | ICD-10-CM | POA: Diagnosis not present

## 2023-11-04 DIAGNOSIS — R278 Other lack of coordination: Secondary | ICD-10-CM

## 2023-11-04 DIAGNOSIS — R2681 Unsteadiness on feet: Secondary | ICD-10-CM

## 2023-11-04 DIAGNOSIS — M6281 Muscle weakness (generalized): Secondary | ICD-10-CM

## 2023-11-04 DIAGNOSIS — R471 Dysarthria and anarthria: Secondary | ICD-10-CM | POA: Diagnosis not present

## 2023-11-04 DIAGNOSIS — R2689 Other abnormalities of gait and mobility: Secondary | ICD-10-CM

## 2023-11-04 DIAGNOSIS — R29818 Other symptoms and signs involving the nervous system: Secondary | ICD-10-CM | POA: Diagnosis not present

## 2023-11-04 DIAGNOSIS — R41842 Visuospatial deficit: Secondary | ICD-10-CM

## 2023-11-04 DIAGNOSIS — R251 Tremor, unspecified: Secondary | ICD-10-CM

## 2023-11-04 DIAGNOSIS — R41844 Frontal lobe and executive function deficit: Secondary | ICD-10-CM

## 2023-11-04 NOTE — Patient Instructions (Signed)
 I am recommending [name] use a rollator in the community (he may purchase from [employer], [employer] or [employer]). The brand I suggest is Drive  I am also recommending [name] use his standard walker in the house to improve safety with turns and management of freezing.

## 2023-11-04 NOTE — Therapy (Signed)
 OUTPATIENT OCCUPATIONAL THERAPY PARKINSON'S TREATMENT  Patient Name: [name] MRN: [id number] DOB:January 24, 1951, 73 y.o., male Today's Date: 11/04/2023  PCP: Ronnald Nian, MD REFERRING PROVIDER: Ihor Austin, NP  END OF SESSION:  OT End of Session - 11/04/23 1452     Visit Number 9    Number of Visits 13   including eval/tx   Date for OT Re-Evaluation 11/24/23    Authorization Type [employer] 2025    Authorization Time Period Auth# [id number] 09/28/23 to 11/09/23 [facility] [employer] 2025    Authorization - Number of Visits 13    Progress Note Due on Visit 10    OT Start Time 1449    OT Stop Time 1529    OT Time Calculation (min) 40 min    Activity Tolerance Patient tolerated treatment well    Behavior During Therapy WFL for tasks assessed/performed             Past Medical History:  Diagnosis Date   Allergy    seasonal   Arthritis    BPH (benign prostatic hyperplasia)    Complication of anesthesia    Pt. stated he had a reaction that ended in him requiring urinary cath placement; hx delirium worsening parkinson symptoms   Diverticulosis    GERD (gastroesophageal reflux disease)    esophageal spasms   Glaucoma    Gout    Head injury, closed, with concussion    Hepatitis C    chronic - Has been treated with Harvoni   HLD (hyperlipidemia)    statin intolerant (Crestor & Simvastatin) - Taking Livalo 1mg  / week   Hypertension    Parkinson's disease (HCC)    Plantar fasciitis    right   PVD (peripheral vascular disease) (HCC)    With no claudication; only mild abdominal aortic atherosclerosis noted on ultrasound.   Spinal stenosis of lumbar region    Thoracic ascending aortic aneurysm (HCC)    4.2 cm ascending TAA 09/2016 CT, 1 yr f/u rec   Ulcer    Past Surgical History:  Procedure Laterality Date   BUNIONECTOMY WITH WEIL OSTEOTOMY Right 11/02/2019   Procedure: Right Foot Lapidus, Modified McBride Bunionectomy,  Hallux Akin Osteotomy;  Surgeon:  Toni Arthurs, MD;  Location: [facility];  Service: Orthopedics;  Laterality: Right;   CARDIAC CATHETERIZATION  2005   30% Cx. Dr. Elsie Lincoln   cataract surgery Left 11/05/2014   COLONOSCOPY     COLONOSCOPY N/A 01/09/2021   Procedure: COLONOSCOPY;  Surgeon: Kerin Salen, MD;  Location: [facility];  Service: Gastroenterology;  Laterality: N/A;   ESOPHAGOGASTRODUODENOSCOPY N/A 05/02/2015   Procedure: ESOPHAGOGASTRODUODENOSCOPY (EGD);  Surgeon: Bernette Redbird, MD;  Location: [facility];  Service: Endoscopy;  Laterality: N/A;   ESOPHAGOGASTRODUODENOSCOPY  05/02/2015   no source of pt chest pain endoscopically evident. small hiatal hernia.   ESOPHAGOGASTRODUODENOSCOPY (EGD) WITH PROPOFOL N/A 01/02/2021   Procedure: ESOPHAGOGASTRODUODENOSCOPY (EGD) WITH PROPOFOL;  Surgeon: Vida Rigger, MD;  Location: [facility];  Service: Endoscopy;  Laterality: N/A;   EYE SURGERY     HARDWARE REMOVAL Right 11/02/2019   Procedure: Second Metatarsal Removal of Deep Implant and Rotational Osteotomy;  Surgeon: Toni Arthurs, MD;  Location: [facility];  Service: Orthopedics;  Laterality: Right;   IR ANGIOGRAM SELECTIVE EACH ADDITIONAL VESSEL  01/04/2021   IR ANGIOGRAM SELECTIVE EACH ADDITIONAL VESSEL  01/04/2021   IR ANGIOGRAM VISCERAL SELECTIVE  01/04/2021   IR US GUIDE VASC ACCESS RIGHT  01/04/2021   LUMBAR LAMINECTOMY/DECOMPRESSION MICRODISCECTOMY  N/A 07/03/2021   Procedure: Lumbar three through five decompression with lumbar three through five insitu fusion;  Surgeon: Venita Lick, MD;  Location: [facility];  Service: Orthopedics;  Laterality: N/A;   MEMBRANE PEEL Left 03/14/2014   Procedure: MEMBRANE PEEL; ENDOLASER;  Surgeon: Edmon Crape, MD;  Location: [facility] OR;  Service: Ophthalmology;  Laterality: Left;   NM MYOVIEW LTD  10/2015   LOW RISK. Small, fixed basal lateral defect - likely diaphragmatic attenuation. 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  No R WMA.  GRII DD-moderately dilated LA..  Mildly dilated RV but normal function.  Normal RAP/CVP.  Trivial AI with mild to moderate sclerosis-no stenosis   UMBILICAL HERNIA REPAIR  2023   UPPER GASTROINTESTINAL ENDOSCOPY     WEIL OSTEOTOMY Right 09/01/2017   Procedure: RIGHT GREAT TOE CHEVRON AND WEIL OSTEOTOMY 2ND METATARSAL;  Surgeon: Nadara Mustard, MD;  Location: [facility] OR;  Service: Orthopedics;  Laterality: Right;   XI ROBOTIC ASSISTED VENTRAL HERNIA N/A 03/17/2022   Procedure: XI ROBOTIC ASSISTED VENTRAL HERNIA;  Surgeon: Henrene Dodge, MD;  Location: [facility] ORS;  Service: General;  Laterality: N/A;   Patient Active Problem List   Diagnosis Date Noted   Ventral hernia without obstruction or gangrene    Primary open angle glaucoma of left eye, mild stage 06/23/2021   Facial numbness 12/24/2020   Neovascular glaucoma, right eye, stage unspecified 12/16/2020   Lumbar radiculopathy 11/25/2020   Gastroesophageal reflux disease 09/10/2020   Dry eyes, bilateral 08/15/2020   Presbycusis of right ear 01/17/2020   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   Secondary corneal edema of right eye 12/12/2019   Ptosis of right eyelid 12/12/2019   OAB (overactive bladder) 11/29/2019   Bunion of great toe of right foot 07/14/2017   Claw toe, acquired, right 07/14/2017   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   Medication management 10/09/2016   Chest pain at rest 07/04/2015   Plantar fasciitis of right foot 02/19/2015   Depression 08/14/2014   Family history of heart disease in male family member before age 56 04/26/2014   Mild cognitive impairment 03/13/2014   Parkinsonian tremor (HCC)  02/12/2014   Hyperlipidemia with target LDL less than 100    Abdominal aortic atherosclerosis (HCC)    Moderate essential hypertension 02/25/2011   Arthropathy 02/25/2011   HAV (hallux abducto valgus) 02/25/2011   ONSET DATE: 09/20/2023  REFERRING DIAG: G20.C (ICD-10-CM) - Parkinsonism, unspecified  THERAPY DIAG:  Muscle weakness (generalized)  Other lack of coordination  Tremor  Other symptoms and signs involving the nervous system  Visuospatial deficit  Attention and concentration deficit  Frontal lobe and executive function deficit  Rationale for Evaluation and Treatment: Rehabilitation  SUBJECTIVE:   SUBJECTIVE STATEMENT:  Pt accompanied by: self  Pt reports he is better able to see the larger, black background dominoes.   PERTINENT HISTORY: Tremor (2015), parkinsonism, allergies, arthritis, reflux disease, glaucoma, gout, hepatitis C, hyperlipidemia, hypertension, plantar fasciitis, peripheral vascular disease, and BPH, admitted 07/03/2021 for L3-5 spinal fusion Back, foot, hernia, neuro surgery (pains)  PRECAUTIONS: Fall and Other: Vision loss R eye  WEIGHT BEARING RESTRICTIONS:  No  PAIN: Pain in back  Are you having pain? Yes: NPRS scale: at worst 6-7/10 Pain location: Lower back (every day) and some down right leg Pain description:  can be sharp pains, often just aching  Aggravating factors: Moving, bending, lifting Relieving factors: Ibuprofen, acetaminophen, resting, heating pad  FALLS: Has patient fallen in last 6 months? No - near misses - ie) getting out of the chair  LIVING ENVIRONMENT: Lives with: lives with their spouse Lives in: House/apartment Stairs: Yes: Internal: 14+ steps; on left going up and External: 2-3 steps; can reach both Has following equipment at home: Single point cane, Walker - 2 wheeled, shower chair, Grab bars, and elevated toilet seat, walk in shower with removable seat - not using it at this time  PLOF: Independent with  basic ADLs, Independent with community mobility without device, Needs assistance with homemaking, and Not driving  PATIENT GOALS: Pt reported that he wanted to be able to improve memory/recall, regain/improve balance ie) to minimize getting frozen, keep up/improve strength ie) to open doors for wife, to be able to take a container off the shelf, get the bottles of water from the car etc.  OBJECTIVE:  Note: Objective measures were completed at Evaluation unless otherwise noted.  HAND DOMINANCE: Right  ADLs: Overall ADLs: Occasional support from spouse Transfers/ambulation related to ADLs: Ind/Mod I Eating: Pt can cut some foods himself but may get help Grooming: Pt shaves himself - uses regular razor UB Dressing: Ind - may get help to pull his jacket off LB Dressing: Ind Toileting: Ind - uses regular undergarments, but has used pull-ups at night but is not using them at this time Bathing: Showers a couple of times/week and stands in the shower despite having a shower stool available Tub Shower transfers: Mod I - has grab bars Equipment: Shower seat without back, Grab bars, Walk in shower, and Reacher  IADLs: Not driving due to vision trouble - ie) limited peripheral vision Shopping: Goes with spouse Light housekeeping: Pt reports helping to wash dishes Meal Prep: Helps spouse but is slower getting things done.  He likes to cook breakfast. Community mobility: Ind without AE Medication management: Spouse helps take care of meds Financial management: Spouse does this  Handwriting: 50-75% legible - By pt report "it sucks."  Pt had improved legibility with printing verus writing.  MOBILITY STATUS: Independent and remote h/o falls  POSTURE COMMENTS:  rounded shoulders, forward head, increased thoracic kyphosis, flexed trunk , and weight shift left   ACTIVITY TOLERANCE: Activity tolerance: Good  FUNCTIONAL OUTCOME MEASURES: Fastening/unfastening 3 buttons: 46.24 sec Physical  performance test: PPT#2 (simulated eating) 16.96 & PPT#4 (donning/doffing jacket): 28.58 sec  COORDINATION: 9 Hole Peg test: Right: 31.54 sec; Left: 34.65 sec Box and Blocks:  Right 38 blocks, Left 38 blocks  UE ROM:  WFL - slight end range limitations  UE MMT:   WFL - 4-/5 Grip Strength:  Right: 53.1, 56.4, 55.7 - Average 55.1 lbs Left:  42.7, 51.3, 48.2 - Average 47.4 lbs  SENSATION: Pt reports his fingertips get cold and "turn blue", (not thumbs)   MUSCLE TONE: WFL  COGNITION: Overall cognitive status:  slower processing by report ie) gets frozen and asks for frequent feedback from spouse for memory  PD OBSERVATIONS: Dyskinesias, Postural tremors, and pt and spouse report some freezing   VISION: Pt has history of glaucoma and some eye injections with loss of peripheral vision, R eye limitations and confirms that he has used magnify glass.  Pt has Iphone.                                                                                                                    TODAY'S TREATMENT:   Patient participated in 2 games of dominoes requiring min verbal cues for proper play as needed to work on fine motor coordination, gross motor coordination, upper extremity range of motion, scanning and locating of items, processing, bimanual coordination/trunk control, sequencing of unfamiliar motor movements or tasks, motor planning, and item/pattern recognition.   OT educated pt on table top play of Solitaire for BUE ROM, coordination, visual processing, scanning, and sequencing. Pt required moderate cues for proper play.     PATIENT EDUCATION: Education details: functional scanning activities Person educated: Patient Education method: Explanation, Demonstration, Tactile cues, and Verbal cues Education comprehension: verbalized understanding, returned demonstration, verbal cues required, and needs further education  HOME EXERCISE PROGRAM: 10/19/23: Visual Compensatory Strategies  10/28/23:  Bag exercises 11/02/2023: PWR! Hands and sitting  GOALS: Goals reviewed with patient? Yes  SHORT TERM GOALS: Target date: 10/22/23  Pt will be independent with PD specific HEP. Baseline: not yet initiated Goal status: IN Progress  2.  Pt will independently recall and demonstrate at least 3 compensatory strategies/adaptations for visual impairment/memory aide without cueing including technology options with IPhone  Baseline: New to outpt OT - h/o glaucoma, has used magnify glass Goal status: IN Progress  3.  Pt will improve confidence with using his own strength ie) to open doors for wife, to be able to take a container off the shelf, and carry items from the car  Baseline: UE strength: 4-/5; Grip: Right: 55.1 lbs Left: 47.4 lbs Goal status: IN Progress  4.  Pt will write a grocery list x 10 words with no significant decrease in size and maintain 75% legibility. Baseline: 50-75% legibility Goal status: IN Progress  5.  Pt will demonstrate fine motor coordination for ADLs as evidenced by no decline and/or improvement in 9 hole peg test score and Box and Blocks Test for BUE. Baseline: 9 Hole: Right: 31.54 sec; Left: 34.65 sec  Box and Blocks: Right 38 blocks, Left 38 blocks Goal status: IN Progress   LONG TERM GOALS: Target date: 12/21/2023    Pt will verbalize understanding of ways to prevent future PD related complications and PD community resources. Baseline: not yet initiated Goal status: IN Progress  2.  Pt will verbalize understanding of ways to keep thinking skills sharp/WARM strategies and ways to compensate for STM changes now and in the future, including use of Memory Notebook. Baseline: not yet initiated Goal status: IN Progress  3.  Pt will verbalize understanding of adapted strategies to maximize safety, independence and timeliness with ADLs/IADLs. Baseline: Physical performance test: PPT#2 (simulated eating) 16.96 & PPT#4 (donning/doffing jacket): 28.58 sec Goal  status: IN Progress  4.  Pt will demonstrate improved FM skills for max ease with fastening buttons as evidenced by decreasing 3 button/unbutton time by 5+ seconds Baseline: 46.24 secs  Goal status: IN Progress    ASSESSMENT:  CLINICAL IMPRESSION: Patient demonstrating fair understanding of functional activities this visit as needed to practice visual scanning, promote BUE functional use, and provide cognitive challenges as needed to progress towards goals. Pt to benefit from review of solitaire to promote carryover/proper play outside of clinic.   PERFORMANCE DEFICITS: in functional skills including ADLs, IADLs, coordination, dexterity, proprioception, sensation, tone, ROM, strength, pain, flexibility, Fine motor control, Gross motor control, mobility, balance, body mechanics, endurance, continence, decreased knowledge of precautions, decreased knowledge of use of DME, vision, and UE functional use, cognitive skills including attention, energy/drive, learn, memory, problem solving, safety awareness, thought, and understand, and psychosocial skills including coping strategies, environmental adaptation, and routines and behaviors.   IMPAIRMENTS: are limiting patient from ADLs, IADLs, and leisure.   COMORBIDITIES:  has co-morbidities such as glaucoma, history of back and foot surgery, HTN  that affects occupational performance. Patient will benefit from skilled OT to address above impairments and improve overall function.  REHAB POTENTIAL: Good   PLAN:  OT FREQUENCY: 2x/week  OT DURATION: 6 weeks  PLANNED INTERVENTIONS: 97168 OT Re-evaluation, 97535 self care/ADL training, 27253 therapeutic exercise, 97530 therapeutic activity, 97112 neuromuscular re-education, [phone] aquatic therapy, balance training, functional mobility training, visual/perceptual remediation/compensation, psychosocial skills training, energy conservation, coping strategies training, patient/family education, and DME and/or  AE instructions  RECOMMENDED OTHER SERVICES:   CONSULTED AND AGREED WITH PLAN OF CARE: Patient and family member/caregiver  PLAN FOR NEXT SESSION:  Review Golf solitaire   Review Visual comp strategies provided 3/25 and add vision HEP  - review phone modifications: flashlight, magnifier, location of apps  Reprint Exercises for better visibility?  Progress HEP training - strength, coordination to increase confidence with using his own strength WARM memory strategies (potentially needs handout) - provide handouts in page protectors Memory Note Book - or Notes on phone - Sees ST for eval on 4/15 ADL comp strategies Tremor handouts Add Bag activities with can/weight as appropriate and add following activity - Hold bag in one hand. Toss bag up and catch with the same/opposite hand.   Recheck coordination testing Auth through 11/09/23   Delana Meyer, OT 11/04/2023, 3:25 PM

## 2023-11-04 NOTE — Therapy (Signed)
 OUTPATIENT PHYSICAL THERAPY NEURO TREATMENT   Patient Name: [name] MRN: [id number] DOB:02/22/1951, 73 y.o., male Today's Date: 11/04/2023   PCP: Ronnald Nian, MD REFERRING PROVIDER: Ihor Austin, NP  END OF SESSION:  PT End of Session - 11/04/23 1404     Visit Number 9    Number of Visits 13    Date for PT Re-Evaluation 11/16/23    Authorization Type [employer] Medicare    PT Start Time 1402    PT Stop Time 1441   Pt needing to use restroom   PT Time Calculation (min) 39 min    Equipment Utilized During Treatment Gait belt    Activity Tolerance Patient tolerated treatment well    Behavior During Therapy WFL for tasks assessed/performed             Past Medical History:  Diagnosis Date   Allergy    seasonal   Arthritis    BPH (benign prostatic hyperplasia)    Complication of anesthesia    Pt. stated he had a reaction that ended in him requiring urinary cath placement; hx delirium worsening parkinson symptoms   Diverticulosis    GERD (gastroesophageal reflux disease)    esophageal spasms   Glaucoma    Gout    Head injury, closed, with concussion    Hepatitis C    chronic - Has been treated with Harvoni   HLD (hyperlipidemia)    statin intolerant (Crestor & Simvastatin) - Taking Livalo 1mg  / week   Hypertension    Parkinson's disease (HCC)    Plantar fasciitis    right   PVD (peripheral vascular disease) (HCC)    With no claudication; only mild abdominal aortic atherosclerosis noted on ultrasound.   Spinal stenosis of lumbar region    Thoracic ascending aortic aneurysm (HCC)    4.2 cm ascending TAA 09/2016 CT, 1 yr f/u rec   Ulcer    Past Surgical History:  Procedure Laterality Date   BUNIONECTOMY WITH WEIL OSTEOTOMY Right 11/02/2019   Procedure: Right Foot Lapidus, Modified McBride Bunionectomy,  Hallux Akin Osteotomy;  Surgeon: Toni Arthurs, MD;  Location: [facility];  Service: Orthopedics;  Laterality: Right;   CARDIAC  CATHETERIZATION  2005   30% Cx. Dr. Elsie Lincoln   cataract surgery Left 11/05/2014   COLONOSCOPY     COLONOSCOPY N/A 01/09/2021   Procedure: COLONOSCOPY;  Surgeon: Kerin Salen, MD;  Location: [facility];  Service: Gastroenterology;  Laterality: N/A;   ESOPHAGOGASTRODUODENOSCOPY N/A 05/02/2015   Procedure: ESOPHAGOGASTRODUODENOSCOPY (EGD);  Surgeon: Bernette Redbird, MD;  Location: [facility];  Service: Endoscopy;  Laterality: N/A;   ESOPHAGOGASTRODUODENOSCOPY  05/02/2015   no source of pt chest pain endoscopically evident. small hiatal hernia.   ESOPHAGOGASTRODUODENOSCOPY (EGD) WITH PROPOFOL N/A 01/02/2021   Procedure: ESOPHAGOGASTRODUODENOSCOPY (EGD) WITH PROPOFOL;  Surgeon: Vida Rigger, MD;  Location: [facility];  Service: Endoscopy;  Laterality: N/A;   EYE SURGERY     HARDWARE REMOVAL Right 11/02/2019   Procedure: Second Metatarsal Removal of Deep Implant and Rotational Osteotomy;  Surgeon: Toni Arthurs, MD;  Location: [facility];  Service: Orthopedics;  Laterality: Right;   IR ANGIOGRAM SELECTIVE EACH ADDITIONAL VESSEL  01/04/2021   IR ANGIOGRAM SELECTIVE EACH ADDITIONAL VESSEL  01/04/2021   IR ANGIOGRAM VISCERAL SELECTIVE  01/04/2021   IR US GUIDE VASC ACCESS RIGHT  01/04/2021   LUMBAR LAMINECTOMY/DECOMPRESSION MICRODISCECTOMY N/A 07/03/2021   Procedure: Lumbar three through five decompression with lumbar three through five insitu fusion;  Surgeon: Shon Baton,  Debria Garret, MD;  Location: [facility];  Service: Orthopedics;  Laterality: N/A;   MEMBRANE PEEL Left 03/14/2014   Procedure: MEMBRANE PEEL; ENDOLASER;  Surgeon: Edmon Crape, MD;  Location: [facility];  Service: Ophthalmology;  Laterality: Left;   NM MYOVIEW LTD  10/2015   LOW RISK. Small, fixed basal lateral defect - likely diaphragmatic attenuation. 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  No R WMA.  GRII DD-moderately dilated LA..  Mildly dilated RV but normal function.  Normal RAP/CVP.  Trivial AI with mild to moderate sclerosis-no stenosis   UMBILICAL HERNIA REPAIR  2023   UPPER GASTROINTESTINAL ENDOSCOPY     WEIL OSTEOTOMY Right 09/01/2017   Procedure: RIGHT GREAT TOE CHEVRON AND WEIL OSTEOTOMY 2ND METATARSAL;  Surgeon: Nadara Mustard, MD;  Location: [facility];  Service: Orthopedics;  Laterality: Right;   XI ROBOTIC ASSISTED VENTRAL HERNIA N/A 03/17/2022   Procedure: XI ROBOTIC ASSISTED VENTRAL HERNIA;  Surgeon: Henrene Dodge, MD;  Location: [facility];  Service: General;  Laterality: N/A;   Patient Active Problem List   Diagnosis Date Noted   Ventral hernia without obstruction or gangrene    Primary open angle glaucoma of left eye, mild stage 06/23/2021   Facial numbness 12/24/2020   Neovascular glaucoma, right eye, stage unspecified 12/16/2020   Lumbar radiculopathy 11/25/2020   Gastroesophageal reflux disease 09/10/2020   Dry eyes, bilateral 08/15/2020   Presbycusis of right ear 01/17/2020   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   Secondary corneal edema of right eye 12/12/2019   Ptosis of right eyelid 12/12/2019   OAB (overactive bladder) 11/29/2019   Bunion of great toe of right foot 07/14/2017   Claw toe, acquired, right 07/14/2017   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   Medication management 10/09/2016   Chest pain at rest 07/04/2015   Plantar fasciitis of right foot 02/19/2015   Depression 08/14/2014   Family history of heart disease in male family member before age 83 04/26/2014   Mild cognitive impairment 03/13/2014   Parkinsonian tremor (HCC) 02/12/2014   Hyperlipidemia with target LDL less than 100    Abdominal aortic atherosclerosis (HCC)    Moderate  essential hypertension 02/25/2011   Arthropathy 02/25/2011   HAV (hallux abducto valgus) 02/25/2011    ONSET DATE: 09/08/2023 (referral)   REFERRING DIAG: G20.C (ICD-10-CM) - Primary parkinsonism (HCC)  THERAPY DIAG:  Muscle weakness (generalized)  Other lack of coordination  Unsteadiness on feet  Other abnormalities of gait and mobility  Rationale for Evaluation and Treatment: Rehabilitation  SUBJECTIVE:  SUBJECTIVE STATEMENT: Pt reports doing well. Did not check BP today. Denies falls or acute changes. Back pain is better today.   Pt accompanied by: self   PERTINENT HISTORY:  tremor (2014), parkinsonism, allergies, arthritis, reflux disease, glaucoma, gout, hepatitis C, hyperlipidemia, hypertension, plantar fasciitis, peripheral vascular disease, and BPH, admitted 12/8 for L3-5 spinal fusion   PAIN:  Are you having pain? Yes: NPRS scale: 2/10 Pain location: back pain Pain description: achy Aggravating factors: unknown Relieving factors: sitting down or resting, taking Tylenol or ibuprofen   PRECAUTIONS: Fall and Other: blind in R eye  RED FLAGS: None   WEIGHT BEARING RESTRICTIONS: No  FALLS: Has patient fallen in last 6 months? No and reports frequent near misses   LIVING ENVIRONMENT: Lives with: lives with their spouse Lives in: House/apartment Stairs: Yes: Internal: 2 flights steps; on left going up and External: 1 in front and 3 in side steps; none Has following equipment at home: Single point cane, Walker - 2 wheeled, shower chair, and Grab bars  PLOF: Needs assistance with ADLs and Needs assistance with homemaking  PATIENT GOALS: "Just wanting to have as much control as I can"   OBJECTIVE:  Note: Objective measures were completed at Evaluation unless otherwise  noted.  DIAGNOSTIC FINDINGS: CT of R foot from 2021    IMPRESSION: 1. Interval surgical arthrodesis at the 1st tarsometatarsal articulation. There is no solid osseous ankylosis across the joint, and there is intraosseous cyst formation dorsally. 2. Interval placement of a cortical screw in the medial base of the 1st proximal phalanx without hardware loosening or associated osseous abnormality. 3. Stable deformity of the 1st metatarsal head and neck consistent with previous osteotomy. Stable chronic Freiberg infraction and postsurgical changes in the 2nd metatarsal head. 4. Stable additional mild degenerative changes. No acute findings or significant joint effusions.  COGNITION: Overall cognitive status: Impaired   SENSATION: Pt reports occasional numbness/tingling in BLEs    MUSCLE TONE: LLE: Rigidity   POSTURE: rounded shoulders, forward head, increased thoracic kyphosis, flexed trunk , and weight shift left  LOWER EXTREMITY ROM:     Active  Right Eval Left Eval  Hip flexion    Hip extension    Hip abduction    Hip adduction    Hip internal rotation    Hip external rotation    Knee flexion    Knee extension    Ankle dorsiflexion    Ankle plantarflexion    Ankle inversion    Ankle eversion     (Blank rows = not tested)  LOWER EXTREMITY MMT:    MMT Right Eval Left Eval  Hip flexion    Hip extension    Hip abduction    Hip adduction    Hip internal rotation    Hip external rotation    Knee flexion    Knee extension    Ankle dorsiflexion    Ankle plantarflexion    Ankle inversion    Ankle eversion    (Blank rows = not tested)  BED MOBILITY:  Pt reports independence w/this but it is difficult due to elevated height of the bed and foot pain   TRANSFERS: Assistive device utilized: None  Sit to stand: SBA Stand to sit: SBA Chair to chair: SBA Pt demonstrates laterally instability to R side, wide BOS, decreased eccentric control   GAIT: Gait  pattern: step through pattern, decreased arm swing- Left, decreased stride length, lateral hip instability, decreased trunk rotation, trunk flexed, and wide BOS Distance walked:  Various clinic distances  Assistive device utilized: None Level of assistance: SBA and CGA Comments: Pt maintains L hand in fist. Wide BOS w/decreased lateral weight shift. Pt lost balance posteriorly w/turn, requiring CGA for safety   VITALS  Vitals:   11/04/23 1406  BP: (!) 141/83  Pulse: (!) 55                                                                                                             TREATMENT:   Self-care/home management   Assessed BP in seated position and WNL (see above)   Ther Act   Gait pattern: step through pattern, decreased stride length, scissoring, lateral hip instability, trunk flexed, and narrow BOS Distance walked: 115' Assistive device utilized: Environmental consultant - 4 wheeled Level of assistance: SBA Comments: Min cues for proper brake management w/transfers. Noted scissoring of LLE occasionally but no LOB noted.   Gait pattern: step through pattern, decreased stride length, scissoring, lateral hip instability, trunk flexed, and narrow BOS Distance walked: 115' plus various clinic distances  Assistive device utilized: Walker - 2 wheeled Level of assistance: SBA Comments: Min cues to maintain close distance to RW. Continued scissoring of LLE noted but no LOB   Discussed use of rollator vs RW and pt reports he has a RW at home already. Encouraged pt to use RW in the home and obtain rollator for community use and keep it in car. Provided pt w/printed handout w/this information on it to provide to wife as she is not present for therapy sessions and pt has impaired cognition. Also provided resources for where to purchase rollator. Pt verbalized understanding.    PATIENT EDUCATION: Education details: Importance of using walker, continue HEP  Person educated: Patient Education method:  Explanation and Demonstration Education comprehension: verbalized understanding and needs further education  HOME EXERCISE PROGRAM: Access Code: [email or web address] URL: [email or web address] Date: 10/05/2023 Prepared by: Alethia Berthold Roos[place]  Exercises - Seated Windmill Trunk Rotation Stretch  - 1 x daily - 7 x weekly - 3 sets - 10 reps - Standing Reach to Opposite Side with Weight Shift  - 1 x daily - 7 x weekly - 3 sets - 10 reps  *PWR! Moves discontinued as too cognitively challenging for patient at this time  GOALS: Goals reviewed with patient? Yes  SHORT TERM GOALS: Target date: 10/26/2023   Pt will be independent with initial HEP for improved strength, balance, transfers and gait.  Baseline: not established on eval, reports confidence with initial HEP Goal status: MET  2.  MiniBest to be assessed and LTG updated  Baseline:  Goal status: MET  3.  Pt and wife will verbalize and demonstrate freezing strategies to implement at home and in community for reduced fall risk  Baseline:  Goal status: INITIAL  4.  MCTSIB to be assessed and LTG updated Baseline:  Goal status: MET    LONG TERM GOALS: Target date: 11/09/2023   Pt will verbalize understanding of local PD community resources, including fitness post DC.   Baseline:  Goal status: INITIAL  2. Pt will improve MiniBest to 23/28 for decreased fall risk and improvement with compensatory stepping strategies.   Baseline: 19/28 Goal status: REVISED   3.  Pt will improve 5 x STS to less than or equal to 15 seconds w/o UE support and proper body mechanics to demonstrate improved functional strength and transfer efficiency.   Baseline: 19.55s no UE support, weight shift to R Goal status: INITIAL  4.  Pt will perform floor transfer w/SBA for improved fall recovery and safety  Baseline:  Goal status: INITIAL  5.  MCTSIB goal Baseline: 120/120 Goal status: DC due to high baseline score    ASSESSMENT:  CLINICAL  IMPRESSION: Emphasis of skilled physical therapy session on assessing balance w/RW vs rollator and making recommendations for home. Pt ambulates w/both RW and rollator at SBA level and requires occasional min cues to maintain close distance to AD and for proper brake management. At this time, recommend pt use RW in the home (as he already has one) and obtain rollator for community use due to imbalance secondary to PD, painful neuropathic feet, visual impairments and orthostatics. Pt provided w/handout to give to wife w/therapy instructions on them as she was not present for session. Continue POC.   OBJECTIVE IMPAIRMENTS: Abnormal gait, decreased activity tolerance, decreased balance, decreased cognition, decreased coordination, decreased knowledge of condition, decreased knowledge of use of DME, decreased mobility, difficulty walking, decreased strength, decreased safety awareness, impaired sensation, impaired UE functional use, impaired vision/preception, improper body mechanics, and pain.   ACTIVITY LIMITATIONS: carrying, lifting, bending, standing, squatting, stairs, transfers, bed mobility, bathing, dressing, hygiene/grooming, and locomotion level  PARTICIPATION LIMITATIONS: meal prep, cleaning, laundry, medication management, driving, shopping, community activity, and yard work  PERSONAL FACTORS: Age, Fitness, Past/current experiences, and 1-2 comorbidities: PD, blindness in R eye  are also affecting patient's functional outcome.   REHAB POTENTIAL: Fair due to impaired vision, PD  CLINICAL DECISION MAKING: Evolving/moderate complexity  EVALUATION COMPLEXITY: Moderate  PLAN:  PT FREQUENCY: 1-2x/week  PT DURATION: 6 weeks  PLANNED INTERVENTIONS: 97164- PT Re-evaluation, 97110-Therapeutic exercises, 97530- Therapeutic activity, [id number]- Neuromuscular re-education, 97535- Self Care, 53664- Manual therapy, [phone]- Gait training, [phone]- Orthotic Fit/training, [phone]- Canalith repositioning, [id number]-  Aquatic Therapy, [phone]- Electrical stimulation (manual), Patient/Family education, Balance training, Stair training, Dry Needling, Joint mobilization, Vestibular training, and DME instructions  PLAN FOR NEXT SESSION:  10th visit PN - assess last STG. Did he get rollator? Assess BP and watch for orthostatics. Add to HEP to work on turns, truncal mobility and compensation strategies for impaired peripheral vision, lateral weight shift to L, posterior stepping strategy and balance reactions when looking over shoulder    Elif Yonts E Adelyne Marchese, PT, DPT 11/04/2023, 2:43 PM

## 2023-11-08 ENCOUNTER — Other Ambulatory Visit (HOSPITAL_COMMUNITY): Payer: Self-pay

## 2023-11-09 ENCOUNTER — Ambulatory Visit: Admitting: Physical Therapy

## 2023-11-09 ENCOUNTER — Ambulatory Visit: Admitting: Occupational Therapy

## 2023-11-09 ENCOUNTER — Ambulatory Visit

## 2023-11-09 NOTE — Therapy (Deleted)
 OUTPATIENT SPEECH LANGUAGE PATHOLOGY PARKINSON'S EVALUATION   Patient Name: [name] MRN: [id number] DOB:07-07-1951, 73 y.o., male Today's Date: 11/09/2023  PCP: *** REFERRING PROVIDER: ***  END OF SESSION:   Past Medical History:  Diagnosis Date   Allergy    seasonal   Arthritis    BPH (benign prostatic hyperplasia)    Complication of anesthesia    Pt. stated he had a reaction that ended in him requiring urinary cath placement; hx delirium worsening parkinson symptoms   Diverticulosis    GERD (gastroesophageal reflux disease)    esophageal spasms   Glaucoma    Gout    Head injury, closed, with concussion    Hepatitis C    chronic - Has been treated with Harvoni   HLD (hyperlipidemia)    statin intolerant (Crestor & Simvastatin) - Taking Livalo 1mg  / week   Hypertension    Parkinson's disease (HCC)    Plantar fasciitis    right   PVD (peripheral vascular disease) (HCC)    With no claudication; only mild abdominal aortic atherosclerosis noted on ultrasound.   Spinal stenosis of lumbar region    Thoracic ascending aortic aneurysm (HCC)    4.2 cm ascending TAA 09/2016 CT, 1 yr f/u rec   Ulcer    Past Surgical History:  Procedure Laterality Date   BUNIONECTOMY WITH WEIL OSTEOTOMY Right 11/02/2019   Procedure: Right Foot Lapidus, Modified McBride Bunionectomy,  Hallux Akin Osteotomy;  Surgeon: Toni Arthurs, MD;  Location: [facility];  Service: Orthopedics;  Laterality: Right;   CARDIAC CATHETERIZATION  2005   30% Cx. Dr. Elsie Lincoln   cataract surgery Left 11/05/2014   COLONOSCOPY     COLONOSCOPY N/A 01/09/2021   Procedure: COLONOSCOPY;  Surgeon: Kerin Salen, MD;  Location: [facility];  Service: Gastroenterology;  Laterality: N/A;   ESOPHAGOGASTRODUODENOSCOPY N/A 05/02/2015   Procedure: ESOPHAGOGASTRODUODENOSCOPY (EGD);  Surgeon: Bernette Redbird, MD;  Location: [facility];  Service: Endoscopy;  Laterality: N/A;   ESOPHAGOGASTRODUODENOSCOPY   05/02/2015   no source of pt chest pain endoscopically evident. small hiatal hernia.   ESOPHAGOGASTRODUODENOSCOPY (EGD) WITH PROPOFOL N/A 01/02/2021   Procedure: ESOPHAGOGASTRODUODENOSCOPY (EGD) WITH PROPOFOL;  Surgeon: Vida Rigger, MD;  Location: [facility];  Service: Endoscopy;  Laterality: N/A;   EYE SURGERY     HARDWARE REMOVAL Right 11/02/2019   Procedure: Second Metatarsal Removal of Deep Implant and Rotational Osteotomy;  Surgeon: Toni Arthurs, MD;  Location: [facility];  Service: Orthopedics;  Laterality: Right;   IR ANGIOGRAM SELECTIVE EACH ADDITIONAL VESSEL  01/04/2021   IR ANGIOGRAM SELECTIVE EACH ADDITIONAL VESSEL  01/04/2021   IR ANGIOGRAM VISCERAL SELECTIVE  01/04/2021   IR US GUIDE VASC ACCESS RIGHT  01/04/2021   LUMBAR LAMINECTOMY/DECOMPRESSION MICRODISCECTOMY N/A 07/03/2021   Procedure: Lumbar three through five decompression with lumbar three through five insitu fusion;  Surgeon: Venita Lick, MD;  Location: [facility] OR;  Service: Orthopedics;  Laterality: N/A;   MEMBRANE PEEL Left 03/14/2014   Procedure: MEMBRANE PEEL; ENDOLASER;  Surgeon: Edmon Crape, MD;  Location: [facility] OR;  Service: Ophthalmology;  Laterality: Left;   NM MYOVIEW LTD  10/2015   LOW RISK. Small, fixed basal lateral defect - likely diaphragmatic attenuation. EF 69%   PARS PLANA VITRECTOMY Left 03/14/2014   Procedure: PARS PLANA VITRECTOMY WITH 25 GAUGE;  Surgeon: Edmon Crape, MD;  Location: [facility] OR;  Service: Ophthalmology;  Laterality: Left;   shave  02/03/2022   angiofibroma   TONSILLECTOMY  TRANSTHORACIC ECHOCARDIOGRAM  01/07/2021   Normal EF 60 to 65%.  No R WMA.  GRII DD-moderately dilated LA..  Mildly dilated RV but normal function.  Normal RAP/CVP.  Trivial AI with mild to moderate sclerosis-no stenosis   UMBILICAL HERNIA REPAIR  2023   UPPER GASTROINTESTINAL ENDOSCOPY     WEIL OSTEOTOMY Right 09/01/2017   Procedure: RIGHT GREAT TOE CHEVRON AND WEIL OSTEOTOMY 2ND METATARSAL;   Surgeon: Timothy Ford, MD;  Location: [facility];  Service: Orthopedics;  Laterality: Right;   XI ROBOTIC ASSISTED VENTRAL HERNIA N/A 03/17/2022   Procedure: XI ROBOTIC ASSISTED VENTRAL HERNIA;  Surgeon: Emmalene Hare, MD;  Location: [facility]S;  Service: General;  Laterality: N/A;   Patient Active Problem List   Diagnosis Date Noted   Ventral hernia without obstruction or gangrene    Primary open angle glaucoma of left eye, mild stage 06/23/2021   Facial numbness 12/24/2020   Neovascular glaucoma, right eye, stage unspecified 12/16/2020   Lumbar radiculopathy 11/25/2020   Gastroesophageal reflux disease 09/10/2020   Dry eyes, bilateral 08/15/2020   Presbycusis of right ear 01/17/2020   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   Secondary corneal edema of right eye 12/12/2019   Ptosis of right eyelid 12/12/2019   OAB (overactive bladder) 11/29/2019   Bunion of great toe of right foot 07/14/2017   Claw toe, acquired, right 07/14/2017   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   Medication management 10/09/2016   Chest pain at rest 07/04/2015   Plantar fasciitis of right foot 02/19/2015   Depression 08/14/2014   Family history of heart disease in male family member before age 62 04/26/2014   Mild cognitive impairment 03/13/2014   Parkinsonian tremor (HCC) 02/12/2014   Hyperlipidemia with target LDL less than 100    Abdominal aortic atherosclerosis (HCC)    Moderate essential hypertension 02/25/2011   Arthropathy 02/25/2011   HAV (hallux abducto valgus) 02/25/2011    ONSET DATE: 09/29/2023 (referral date)  REFERRING DIAG: G20.C (ICD-10-CM) - Primary parkinsonism (HCC) G31.84 (ICD-10-CM) - MCI (mild cognitive impairment)  THERAPY DIAG:  No diagnosis found.  Rationale for Evaluation and Treatment: Rehabilitation  SUBJECTIVE:   SUBJECTIVE STATEMENT: *** Pt accompanied by: {accompnied:27141}  PERTINENT  HISTORY: Tremor (2015), parkinsonism, allergies, arthritis, reflux disease, glaucoma, gout, hepatitis C, hyperlipidemia, hypertension, plantar fasciitis, peripheral vascular disease, and BPH, admitted 07/03/2021 for L3-5 spinal fusion Back, foot, hernia, neuro surgery (pains)   PAIN:  Are you having pain? {OPRCPAIN:27236}  FALLS: Has patient fallen in last 6 months?  No  LIVING ENVIRONMENT: Lives with: lives with their spouse Lives in: House/apartment  PLOF:  Level of assistance: Independent with ADLs Employment: {SLPemployment:25674}  PATIENT GOALS: ***  OBJECTIVE:  Note: Objective measures were completed at Evaluation unless otherwise noted.  DIAGNOSTIC FINDINGS: ***  COGNITION: Overall cognitive status: {cognition:24006} Areas of impairment: {cognitive impairment:24009} Comments: ***  MOTOR SPEECH: Overall motor speech: {slpimpaired:27210} Level of impairment: {SLP level of impairment:25441} Respiration: {respbreathing:27195} Phonation: {SLP phonation:25439} Resonance: {SLP resonance:25440} Articulation: {SLParticulation:27218} Intelligibility: {SLP Intelligible:25442} Motor planning: {slpmotorspeecherrors:27220} Motor speech errors: {SLP motor speech errors:25443} Interfering components: {SLP Interfering components (MS):25444} Effective technique: {SLP effective technique (MS):25445}  ORAL MOTOR EXAMINATION: Overall status: {OMESLP2:27645} Comments: ***   OBJECTIVE VOICE ASSESSMENT: Sustained "ah" maximum phonation time: *** seconds Sustained "ah" loudness average: *** dB Oral reading (passage) loudness average: *** dB Oral reading loudness range: *** dB Conversational loudness average: *** dB Conversational loudness range: *** dB Voice  quality: {VQL:27192} Stimulability trials: Given SLP modeling and {frequency:26928} {level:26929} cues, loudness average increased to ***dB (range of *** to ***) at (loud "ah", word, sentence, paragraph, conversation) level.   Comments: ***  Completed audio recording of patients baseline voice without cueing from SLP: {yes/no:20286}  Pt {does does not:27788} report difficulty with swallowing which {does does not:27788} warrant further evaluation.  PATIENT REPORTED OUTCOME MEASURES (PROM): {SLPPROM:27095}                                                                                                                            TREATMENT DATE:  11/09/23:    PATIENT EDUCATION: Education details: see above Person educated: {Person educated:25204} Education method: {Education Method:25205} Education comprehension: {Education Comprehension:25206}  HOME EXERCISE PROGRAM: ***   GOALS: Goals reviewed with patient? Yes  SHORT TERM GOALS: Target date: 12/07/2023  *** Baseline: Goal status: INITIAL  2.  *** Baseline:  Goal status: INITIAL  3.  *** Baseline:  Goal status: INITIAL  4.  *** Baseline:  Goal status: INITIAL  5.  *** Baseline:  Goal status: INITIAL  6.  *** Baseline:  Goal status: INITIAL  LONG TERM GOALS: Target date: ***  *** Baseline:  Goal status: INITIAL  2.  *** Baseline:  Goal status: INITIAL  3.  *** Baseline:  Goal status: INITIAL  4.  *** Baseline:  Goal status: INITIAL  5.  *** Baseline:  Goal status: INITIAL  6.  *** Baseline:  Goal status: INITIAL  ASSESSMENT:  CLINICAL IMPRESSION: Patient is a 73 y.o. M who was seen today for ST evaluation related to Parkinson's Disease (PD).   OBJECTIVE IMPAIRMENTS: Objective impairments include {SLPOBJIMP:27107}. These impairments are limiting patient from {SLPLIMIT:27108}.Factors affecting potential to achieve goals and functional outcome are {SLP factors:25450}.. Patient will benefit from skilled SLP services to address above impairments and improve overall function.  REHAB POTENTIAL: Good  PLAN:  SLP FREQUENCY: {rehab frequency:25116}  SLP DURATION: {rehab duration:25117}  PLANNED INTERVENTIONS:  {SLP treatment/interventions:25449}    Tamar Fairly, CCC-SLP 11/09/2023, 10:29 AM

## 2023-11-11 ENCOUNTER — Ambulatory Visit: Admitting: Occupational Therapy

## 2023-11-11 ENCOUNTER — Other Ambulatory Visit: Payer: Self-pay

## 2023-11-11 ENCOUNTER — Ambulatory Visit: Admitting: Physical Therapy

## 2023-11-11 ENCOUNTER — Other Ambulatory Visit (HOSPITAL_COMMUNITY): Payer: Self-pay

## 2023-11-11 DIAGNOSIS — M4316 Spondylolisthesis, lumbar region: Secondary | ICD-10-CM | POA: Diagnosis not present

## 2023-11-12 ENCOUNTER — Other Ambulatory Visit: Payer: Self-pay

## 2023-11-13 ENCOUNTER — Encounter: Payer: Self-pay | Admitting: Cardiology

## 2023-11-13 ENCOUNTER — Encounter: Payer: Self-pay | Admitting: Family Medicine

## 2023-11-15 ENCOUNTER — Other Ambulatory Visit: Payer: Self-pay

## 2023-11-15 ENCOUNTER — Encounter: Payer: Self-pay | Admitting: Adult Health

## 2023-11-15 ENCOUNTER — Other Ambulatory Visit (HOSPITAL_COMMUNITY): Payer: Self-pay

## 2023-11-15 NOTE — Therapy (Unsigned)
 OUTPATIENT SPEECH LANGUAGE PATHOLOGY PARKINSON'S EVALUATION   Patient Name: [name] MRN: [id number] DOB:31-May-1951, 73 y.o., male Today's Date: 11/17/2023  PCP: Watson Hacking MD REFERRING PROVIDER: Johny Nap, NP  END OF SESSION:  End of Session - 11/16/23 1313     Visit Number 1    Number of Visits 13    Date for SLP Re-Evaluation 01/11/24   extended for scheduling   Authorization Type [employer] - auth requested today via email    SLP Start Time 1315    SLP Stop Time  1400    SLP Time Calculation (min) 45 min    Activity Tolerance Patient tolerated treatment well             Past Medical History:  Diagnosis Date   Allergy    seasonal   Arthritis    BPH (benign prostatic hyperplasia)    Complication of anesthesia    Pt. stated he had a reaction that ended in him requiring urinary cath placement; hx delirium worsening parkinson symptoms   Diverticulosis    GERD (gastroesophageal reflux disease)    esophageal spasms   Glaucoma    Gout    Head injury, closed, with concussion    Hepatitis C    chronic - Has been treated with Harvoni   HLD (hyperlipidemia)    statin intolerant (Crestor  & Simvastatin ) - Taking Livalo  1mg  / week   Hypertension    Parkinson's disease (HCC)    Plantar fasciitis    right   PVD (peripheral vascular disease) (HCC)    With no claudication; only mild abdominal aortic atherosclerosis noted on ultrasound.   Spinal stenosis of lumbar region    Thoracic ascending aortic aneurysm (HCC)    4.2 cm ascending TAA 09/2016 CT, 1 yr f/u rec   Ulcer    Past Surgical History:  Procedure Laterality Date   BUNIONECTOMY WITH WEIL OSTEOTOMY Right 11/02/2019   Procedure: Right Foot Lapidus, Modified McBride Bunionectomy,  Hallux Akin Osteotomy;  Surgeon: Amada Backer, MD;  Location: [facility];  Service: Orthopedics;  Laterality: Right;   CARDIAC CATHETERIZATION  2005   30% Cx. Dr. Ebbie Goldmann   cataract surgery Left 11/05/2014    COLONOSCOPY     COLONOSCOPY N/A 01/09/2021   Procedure: COLONOSCOPY;  Surgeon: Genell Ken, MD;  Location: [facility];  Service: Gastroenterology;  Laterality: N/A;   ESOPHAGOGASTRODUODENOSCOPY N/A 05/02/2015   Procedure: ESOPHAGOGASTRODUODENOSCOPY (EGD);  Surgeon: Lanita Pitman, MD;  Location: [facility];  Service: Endoscopy;  Laterality: N/A;   ESOPHAGOGASTRODUODENOSCOPY  05/02/2015   no source of pt chest pain endoscopically evident. small hiatal hernia.   ESOPHAGOGASTRODUODENOSCOPY (EGD) WITH PROPOFOL  N/A 01/02/2021   Procedure: ESOPHAGOGASTRODUODENOSCOPY (EGD) WITH PROPOFOL ;  Surgeon: [name] Blunt, MD;  Location: [facility];  Service: Endoscopy;  Laterality: N/A;   EYE SURGERY     HARDWARE REMOVAL Right 11/02/2019   Procedure: Second Metatarsal Removal of Deep Implant and Rotational Osteotomy;  Surgeon: Amada Backer, MD;  Location: [facility];  Service: Orthopedics;  Laterality: Right;   IR ANGIOGRAM SELECTIVE EACH ADDITIONAL VESSEL  01/04/2021   IR ANGIOGRAM SELECTIVE EACH ADDITIONAL VESSEL  01/04/2021   IR ANGIOGRAM VISCERAL SELECTIVE  01/04/2021   IR US  GUIDE VASC ACCESS RIGHT  01/04/2021   LUMBAR LAMINECTOMY/DECOMPRESSION MICRODISCECTOMY N/A 07/03/2021   Procedure: Lumbar three through five decompression with lumbar three through five insitu fusion;  Surgeon: Mort Ards, MD;  Location: [facility] OR;  Service: Orthopedics;  Laterality: N/A;   MEMBRANE  PEEL Left 03/14/2014   Procedure: MEMBRANE PEEL; ENDOLASER;  Surgeon: Shon Downing, MD;  Location: [facility] OR;  Service: Ophthalmology;  Laterality: Left;   NM MYOVIEW  LTD  10/2015   LOW RISK. Small, fixed basal lateral defect - likely diaphragmatic attenuation. EF 69%   PARS PLANA VITRECTOMY Left 03/14/2014   Procedure: PARS PLANA VITRECTOMY WITH 25 GAUGE;  Surgeon: Shon Downing, MD;  Location: [facility];  Service: Ophthalmology;  Laterality: Left;   shave  02/03/2022   angiofibroma   TONSILLECTOMY     TRANSTHORACIC  ECHOCARDIOGRAM  01/07/2021   Normal EF 60 to 65%.  No R WMA.  GRII DD-moderately dilated LA..  Mildly dilated RV but normal function.  Normal RAP/CVP.  Trivial AI with mild to moderate sclerosis-no stenosis   UMBILICAL HERNIA REPAIR  2023   UPPER GASTROINTESTINAL ENDOSCOPY     WEIL OSTEOTOMY Right 09/01/2017   Procedure: RIGHT GREAT TOE CHEVRON AND WEIL OSTEOTOMY 2ND METATARSAL;  Surgeon: Timothy Ford, MD;  Location: [facility];  Service: Orthopedics;  Laterality: Right;   XI ROBOTIC ASSISTED VENTRAL HERNIA N/A 03/17/2022   Procedure: XI ROBOTIC ASSISTED VENTRAL HERNIA;  Surgeon: Emmalene Hare, MD;  Location: [facility];  Service: General;  Laterality: N/A;   Patient Active Problem List   Diagnosis Date Noted   Ventral hernia without obstruction or gangrene    Primary open angle glaucoma of left eye, mild stage 06/23/2021   Facial numbness 12/24/2020   Neovascular glaucoma, right eye, stage unspecified 12/16/2020   Lumbar radiculopathy 11/25/2020   Gastroesophageal reflux disease 09/10/2020   Dry eyes, bilateral 08/15/2020   Presbycusis of right ear 01/17/2020   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   Secondary corneal edema of right eye 12/12/2019   Ptosis of right eyelid 12/12/2019   OAB (overactive bladder) 11/29/2019   Bunion of great toe of right foot 07/14/2017   Claw toe, acquired, right 07/14/2017   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   Medication management 10/09/2016   Chest pain at rest 07/04/2015   Plantar fasciitis of right foot 02/19/2015   Depression 08/14/2014   Family history of heart disease in male family member before age 27 04/26/2014   Mild cognitive impairment 03/13/2014   Parkinsonian tremor (HCC) 02/12/2014   Hyperlipidemia with target LDL less than 100    Abdominal aortic atherosclerosis (HCC)    Moderate essential hypertension 02/25/2011   Arthropathy 02/25/2011   HAV (hallux  abducto valgus) 02/25/2011    ONSET DATE: 09/29/2023 (referral date)  REFERRING DIAG: G20.C (ICD-10-CM) - Primary parkinsonism (HCC) G31.84 (ICD-10-CM) - MCI (mild cognitive impairment)  THERAPY DIAG:  Cognitive communication deficit  Expressive language impairment  Dysarthria and anarthria  Rationale for Evaluation and Treatment: Rehabilitation  SUBJECTIVE:   SUBJECTIVE STATEMENT: "I repeat stuff. I don't remember that I said it"  Pt accompanied by: self  PERTINENT HISTORY: Tremor (2015), parkinsonism, allergies, arthritis, reflux disease, glaucoma, gout, hepatitis C, hyperlipidemia, hypertension, plantar fasciitis, peripheral vascular disease, and BPH, admitted 07/03/2021 for L3-5 spinal fusion Back, foot, hernia, neuro surgery (pains)   PAIN:  Are you having pain? No  FALLS: Has patient fallen in last 6 months?  No  LIVING ENVIRONMENT: Lives with: lives with their spouse Lives in: House/apartment  PLOF:  Level of assistance: Independent with ADLs, Needed assistance with IADLS Employment: Retired  PATIENT GOALS: improve Midwife and participation  OBJECTIVE:  Note: Objective measures were completed at Evaluation  unless otherwise noted.  COGNITION: Overall cognitive status: Impaired Comments: Hx of MCI but limited intellectual awareness of cognitive deficits; endorsed he is repeating self in conversation d/t impaired recall. Pt requested usual repetition in conversation today to aid recall and processing. Denied any functional cognitive deficits related to iADL management (reports wife is managing)  MOTOR SPEECH: Overall motor speech: impaired Level of impairment: Conversation Respiration: thoracic breathing and clavicular breathing Phonation: normal Resonance: WFL Articulation: Appears intact Intelligibility: Intelligible Effective technique: slow rate, increased vocal intensity, over articulate, pause, and pacing Comments: low intensity,  occasional stutter reported  VERBAL EXPRESSION: Level of generative/spontaneous verbalization: conversation Automatic speech: name: intact and social response: intact  Repetition: Appears intact Naming: Confrontation: 51-75% Interfering components: attention and memory Effective technique: semantic cues, sentence completion, phonemic cues, and written cues Comments: Intermittent word finding episode x1 in conversation, with self-initiating description strategy. Multiple episodes of anomia/dysnomia during BNT with adequate response to varied cues above   STANDARDIZED ASSESSMENTS: [place] NAMING TEST: 51/60  ORAL MOTOR EXAMINATION: Overall status: WFL  PATIENT REPORTED OUTCOME MEASURES (PROM): Communication Participation Item Bank: 20                                                                                                                            TREATMENT DATE:  11/16/23: Educated patient on role of ST. Identified personally relevant goals, including to increase participation in conversation and overall confidence communicating. Recommended increased engagement in conversation at home. Pt usually benefited from extra time and description strategy to aid word finding and processing, which SLP affirmed positive compensatory measures in place.    PATIENT EDUCATION: Education details: see above Person educated: Patient Education method: Explanation Education comprehension: verbalized understanding and needs further education   GOALS: Goals reviewed with patient? Yes  SHORT TERM GOALS: Target date: 12/14/2023  Pt will recall and teach back word retrieval strategies x2 with rare min A Baseline: Goal status: INITIAL  2.  Pt will utilize word retrieval strategies in unstructured conversations x2 given rare min A  Baseline:  Goal status: INITIAL  3.  Pt will effectively utilize attention/processing strategies to aid topic maintenance in conversations x2 given rare min  A Baseline:  Goal status: INITIAL  4.  Pt will carryover cognitive compensations to recall of pertinent information given rare min A  Baseline:  Goal status: INITIAL   LONG TERM GOALS: Target date: 01/11/2024  Pt will consistently utilize trained speech strategies in unstructured conversations x2 with rare min A  Baseline:  Goal status: INITIAL  2.  Pt will report successful carryover speech compensations in communication exchanges x3 outside therapy  Baseline:  Goal status: INITIAL  3. Pt will carryover cognitive compensations to aid recall of pertinent information/tasks at home given rare min A from family  Baseline: Goal status: INITIAL  4.   Pt will report improved communication effectiveness via PROM by 2 pts by LTG date Baseline: CPIB=20 Goal status: INITIAL   ASSESSMENT:  CLINICAL IMPRESSION: Patient is a 73 y.o. M who was seen today for ST evaluation related to Parkinsonism. Reported difficulty recalling his previously stated information in conversation, intermittent word retrieval difficulty, and occasional stutter which has impacted pt's confidence communicating and participating in conversation at home. In conversation today, pt presented with mildly delayed processing speech, reduced attention/recall of topic/question, rare word finding x1, and mildly decreased conversational volume. Pt appropriately but frequently requested repetition in conversation to support recall and processing. Demonstrated ability to describe during word finding episode with occasional min A. Endorsed word finding episodes ~1-2x/day. Pt would benefit from skilled ST intervention to address  aforementioned deficits given functional impact on patient's life participation and engagement in conversation.  OBJECTIVE IMPAIRMENTS: Objective impairments include attention, memory, executive functioning, and expressive language. These impairments are limiting patient from household responsibilities and  effectively communicating at home and in community.Factors affecting potential to achieve goals and functional outcome are co-morbidities and medical prognosis.. Patient will benefit from skilled SLP services to address above impairments and improve overall function.  REHAB POTENTIAL: Good  PLAN:  SLP FREQUENCY: 2x/week  SLP DURATION: 8 weeks (extended for scheduling)  PLANNED INTERVENTIONS: Diet toleration management , Language facilitation, Environmental controls, Cueing hierachy, Cognitive reorganization, Internal/external aids, Functional tasks, SLP instruction and feedback, Compensatory strategies, Patient/family education, [phone] Treatment of speech (30 or 45 min) , [id number] Treatment of swallowing function, and [id number]- Speech [address] Artic, Phon, Eval Compre, Express    [employer], CCC-SLP 11/17/2023, 8:03 AM

## 2023-11-16 ENCOUNTER — Other Ambulatory Visit: Payer: Self-pay

## 2023-11-16 ENCOUNTER — Telehealth: Payer: Self-pay

## 2023-11-16 ENCOUNTER — Ambulatory Visit

## 2023-11-16 ENCOUNTER — Telehealth: Payer: Self-pay | Admitting: Cardiology

## 2023-11-16 DIAGNOSIS — F801 Expressive language disorder: Secondary | ICD-10-CM

## 2023-11-16 DIAGNOSIS — R251 Tremor, unspecified: Secondary | ICD-10-CM | POA: Diagnosis not present

## 2023-11-16 DIAGNOSIS — R29818 Other symptoms and signs involving the nervous system: Secondary | ICD-10-CM | POA: Diagnosis not present

## 2023-11-16 DIAGNOSIS — R2689 Other abnormalities of gait and mobility: Secondary | ICD-10-CM | POA: Diagnosis not present

## 2023-11-16 DIAGNOSIS — H918X3 Other specified hearing loss, bilateral: Secondary | ICD-10-CM

## 2023-11-16 DIAGNOSIS — R2681 Unsteadiness on feet: Secondary | ICD-10-CM | POA: Diagnosis not present

## 2023-11-16 DIAGNOSIS — R41841 Cognitive communication deficit: Secondary | ICD-10-CM

## 2023-11-16 DIAGNOSIS — R471 Dysarthria and anarthria: Secondary | ICD-10-CM | POA: Diagnosis not present

## 2023-11-16 DIAGNOSIS — M6281 Muscle weakness (generalized): Secondary | ICD-10-CM | POA: Diagnosis not present

## 2023-11-16 DIAGNOSIS — R278 Other lack of coordination: Secondary | ICD-10-CM | POA: Diagnosis not present

## 2023-11-16 NOTE — Telephone Encounter (Signed)
 Spoke to patient's wife she was calling to get a sooner appointment with Dr.Harding.Stated husband has been light headed and dizzy.B/P up and down.Appointment scheduled with Dr.Harding 5/6 at 1:20 pm at new office.Advised I will mail her directions.She will discuss at visit if husband needs to have a repeat ct of abd/pelvis.

## 2023-11-16 NOTE — Telephone Encounter (Signed)
 Wife Felipa Horsfall) wants to know if patient will still need to have a CT ABDOMEN PELVIS W WO CONTRAST test.  Wife stated can leave a voice message and she will call right back.

## 2023-11-16 NOTE — Telephone Encounter (Signed)
 [name]

## 2023-11-17 ENCOUNTER — Other Ambulatory Visit: Payer: Self-pay | Admitting: Licensed Clinical Social Worker

## 2023-11-17 NOTE — Patient Instructions (Signed)
 Visit Information  Thank you for taking time to visit with me today. Please don't hesitate to contact me if I can be of assistance to you before our next scheduled appointment.  Your next care management appointment is no further scheduled appointments.    Closing From: Complex Care Management.  Please call the care guide team at [phone] if you need to cancel, schedule, or reschedule an appointment.   Please call the Suicide and Crisis Lifeline: 988 go to [facility] Urgent [facility] [address] [place] [phone]) call 911 if you are experiencing a Mental Health or Behavioral Health Crisis or need someone to talk to.  Jonda Neighbours, PhD [facility], [facility] Social Worker Direct Dial: [phone]  Fax: [phone]

## 2023-11-17 NOTE — Patient Outreach (Signed)
 Complex Care Management   Visit Note  11/17/2023  Name:  [name] MRN: [id number] DOB: [birth year]  Situation: Referral received for Complex Care Management related to SDOH Barriers:  Hearin aid  I obtained verbal consent from Patient.  Visit completed with patient and spouse  on the phone  Background:   Past Medical History:  Diagnosis Date   Allergy    seasonal   Arthritis    BPH (benign prostatic hyperplasia)    Complication of anesthesia    Pt. stated he had a reaction that ended in him requiring urinary cath placement; hx delirium worsening parkinson symptoms   Diverticulosis    GERD (gastroesophageal reflux disease)    esophageal spasms   Glaucoma    Gout    Head injury, closed, with concussion    Hepatitis C    chronic - Has been treated with Harvoni   HLD (hyperlipidemia)    statin intolerant (Crestor  & Simvastatin ) - Taking Livalo  1mg  / week   Hypertension    Parkinson's disease (HCC)    Plantar fasciitis    right   PVD (peripheral vascular disease) (HCC)    With no claudication; only mild abdominal aortic atherosclerosis noted on ultrasound.   Spinal stenosis of lumbar region    Thoracic ascending aortic aneurysm (HCC)    4.2 cm ascending TAA 09/2016 CT, 1 yr f/u rec   Ulcer     Assessment: Patient Reported Symptoms:  Cognitive        Neurological      HEENT        Cardiovascular      Respiratory      Endocrine      Gastrointestinal        Genitourinary      Integumentary      Musculoskeletal          Psychosocial       Quality of Family Relationships: supportive Do you feel physically threatened by others?: No      10/19/2023   11:33 AM  Depression screen PHQ 2/9  Decreased Interest 0  Down, Depressed, Hopeless 0  PHQ - 2 Score 0  Altered sleeping 0  Tired, decreased energy 3  Change in appetite 0  Feeling bad or failure about yourself  1  Trouble concentrating 0  Moving slowly or fidgety/restless 0  Suicidal  thoughts 0  PHQ-9 Score 4    There were no vitals filed for this visit.  Medications Reviewed Today   Medications were not reviewed in this encounter     Recommendation:   DME requests:  other hearing aid, Sw provided information for hearing aid and  the family is utilizing the information and deciding which company to use  Follow Up Plan:   Closing From:  Complex Care Management no further follow up needed  Jonda Neighbours, PhD [facility] Social Worker Direct Dial: [phone]  Fax: [phone]

## 2023-11-18 ENCOUNTER — Encounter: Admitting: Speech Pathology

## 2023-11-19 NOTE — Telephone Encounter (Signed)
 Copied from CRM [id number]. Topic: Clinical - Medication Question >> Nov 17, 2023 10:21 AM Rosaria Common wrote: Reason for CRM: Pt's wife calling to get medical advice. Insurance covered aids. 3rd set of hearing aids. Which hearing aid and/or specialist recommended. Gloriann Larger can be reached at [phone].

## 2023-11-19 NOTE — Addendum Note (Signed)
 Addended by: Eddye Goodie on: 11/19/2023 03:22 PM   Modules accepted: Orders

## 2023-11-22 NOTE — Therapy (Deleted)
 OUTPATIENT SPEECH LANGUAGE PATHOLOGY PARKINSON'S TREATMENT   Patient Name: [name] MRN: [id number] DOB:May 17, 1951, 73 y.o., male Today's Date: 11/22/2023  PCP: Watson Hacking MD REFERRING PROVIDER: Johny Nap, NP  END OF SESSION:    Past Medical History:  Diagnosis Date   Allergy    seasonal   Arthritis    BPH (benign prostatic hyperplasia)    Complication of anesthesia    Pt. stated he had a reaction that ended in him requiring urinary cath placement; hx delirium worsening parkinson symptoms   Diverticulosis    GERD (gastroesophageal reflux disease)    esophageal spasms   Glaucoma    Gout    Head injury, closed, with concussion    Hepatitis C    chronic - Has been treated with Harvoni   HLD (hyperlipidemia)    statin intolerant (Crestor  & Simvastatin ) - Taking Livalo  1mg  / week   Hypertension    Parkinson's disease (HCC)    Plantar fasciitis    right   PVD (peripheral vascular disease) (HCC)    With no claudication; only mild abdominal aortic atherosclerosis noted on ultrasound.   Spinal stenosis of lumbar region    Thoracic ascending aortic aneurysm (HCC)    4.2 cm ascending TAA 09/2016 CT, 1 yr f/u rec   Ulcer    Past Surgical History:  Procedure Laterality Date   BUNIONECTOMY WITH WEIL OSTEOTOMY Right 11/02/2019   Procedure: Right Foot Lapidus, Modified McBride Bunionectomy,  Hallux Akin Osteotomy;  Surgeon: Amada Backer, MD;  Location: [facility];  Service: Orthopedics;  Laterality: Right;   CARDIAC CATHETERIZATION  2005   30% Cx. Dr. Ebbie Goldmann   cataract surgery Left 11/05/2014   COLONOSCOPY     COLONOSCOPY N/A 01/09/2021   Procedure: COLONOSCOPY;  Surgeon: Genell Ken, MD;  Location: [facility];  Service: Gastroenterology;  Laterality: N/A;   ESOPHAGOGASTRODUODENOSCOPY N/A 05/02/2015   Procedure: ESOPHAGOGASTRODUODENOSCOPY (EGD);  Surgeon: Lanita Pitman, MD;  Location: [facility];  Service: Endoscopy;  Laterality: N/A;    ESOPHAGOGASTRODUODENOSCOPY  05/02/2015   no source of pt chest pain endoscopically evident. small hiatal hernia.   ESOPHAGOGASTRODUODENOSCOPY (EGD) WITH PROPOFOL  N/A 01/02/2021   Procedure: ESOPHAGOGASTRODUODENOSCOPY (EGD) WITH PROPOFOL ;  Surgeon: [name] Blunt, MD;  Location: [facility];  Service: Endoscopy;  Laterality: N/A;   EYE SURGERY     HARDWARE REMOVAL Right 11/02/2019   Procedure: Second Metatarsal Removal of Deep Implant and Rotational Osteotomy;  Surgeon: Amada Backer, MD;  Location: [facility];  Service: Orthopedics;  Laterality: Right;   IR ANGIOGRAM SELECTIVE EACH ADDITIONAL VESSEL  01/04/2021   IR ANGIOGRAM SELECTIVE EACH ADDITIONAL VESSEL  01/04/2021   IR ANGIOGRAM VISCERAL SELECTIVE  01/04/2021   IR US  GUIDE VASC ACCESS RIGHT  01/04/2021   LUMBAR LAMINECTOMY/DECOMPRESSION MICRODISCECTOMY N/A 07/03/2021   Procedure: Lumbar three through five decompression with lumbar three through five insitu fusion;  Surgeon: Mort Ards, MD;  Location: [facility] OR;  Service: Orthopedics;  Laterality: N/A;   MEMBRANE PEEL Left 03/14/2014   Procedure: MEMBRANE PEEL; ENDOLASER;  Surgeon: Shon Downing, MD;  Location: [facility] OR;  Service: Ophthalmology;  Laterality: Left;   NM MYOVIEW  LTD  10/2015   LOW RISK. Small, fixed basal lateral defect - likely diaphragmatic attenuation. EF 69%   PARS PLANA VITRECTOMY Left 03/14/2014   Procedure: PARS PLANA VITRECTOMY WITH 25 GAUGE;  Surgeon: Shon Downing, MD;  Location: [facility] OR;  Service: Ophthalmology;  Laterality: Left;   shave  02/03/2022   angiofibroma  TONSILLECTOMY     TRANSTHORACIC ECHOCARDIOGRAM  01/07/2021   Normal EF 60 to 65%.  No R WMA.  GRII DD-moderately dilated LA..  Mildly dilated RV but normal function.  Normal RAP/CVP.  Trivial AI with mild to moderate sclerosis-no stenosis   UMBILICAL HERNIA REPAIR  2023   UPPER GASTROINTESTINAL ENDOSCOPY     WEIL OSTEOTOMY Right 09/01/2017   Procedure: RIGHT GREAT TOE CHEVRON AND WEIL  OSTEOTOMY 2ND METATARSAL;  Surgeon: Timothy Ford, MD;  Location: [facility] OR;  Service: Orthopedics;  Laterality: Right;   XI ROBOTIC ASSISTED VENTRAL HERNIA N/A 03/17/2022   Procedure: XI ROBOTIC ASSISTED VENTRAL HERNIA;  Surgeon: Emmalene Hare, MD;  Location: [facility]S;  Service: General;  Laterality: N/A;   Patient Active Problem List   Diagnosis Date Noted   Ventral hernia without obstruction or gangrene    Primary open angle glaucoma of left eye, mild stage 06/23/2021   Facial numbness 12/24/2020   Neovascular glaucoma, right eye, stage unspecified 12/16/2020   Lumbar radiculopathy 11/25/2020   Gastroesophageal reflux disease 09/10/2020   Dry eyes, bilateral 08/15/2020   Presbycusis of right ear 01/17/2020   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   Secondary corneal edema of right eye 12/12/2019   Ptosis of right eyelid 12/12/2019   OAB (overactive bladder) 11/29/2019   Bunion of great toe of right foot 07/14/2017   Claw toe, acquired, right 07/14/2017   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   Medication management 10/09/2016   Chest pain at rest 07/04/2015   Plantar fasciitis of right foot 02/19/2015   Depression 08/14/2014   Family history of heart disease in male family member before age 106 04/26/2014   Mild cognitive impairment 03/13/2014   Parkinsonian tremor (HCC) 02/12/2014   Hyperlipidemia with target LDL less than 100    Abdominal aortic atherosclerosis (HCC)    Moderate essential hypertension 02/25/2011   Arthropathy 02/25/2011   HAV (hallux abducto valgus) 02/25/2011    ONSET DATE: 09/29/2023 (referral date)  REFERRING DIAG: G20.C (ICD-10-CM) - Primary parkinsonism (HCC) G31.84 (ICD-10-CM) - MCI (mild cognitive impairment)  THERAPY DIAG:  No diagnosis found.  Rationale for Evaluation and Treatment: Rehabilitation  SUBJECTIVE:   SUBJECTIVE STATEMENT: "I repeat stuff. I don't remember  that I said it"  Pt accompanied by: self  PERTINENT HISTORY: Tremor (2015), parkinsonism, allergies, arthritis, reflux disease, glaucoma, gout, hepatitis C, hyperlipidemia, hypertension, plantar fasciitis, peripheral vascular disease, and BPH, admitted 07/03/2021 for L3-5 spinal fusion Back, foot, hernia, neuro surgery (pains)   PAIN:  Are you having pain? No  FALLS: Has patient fallen in last 6 months?  No  LIVING ENVIRONMENT: Lives with: lives with their spouse Lives in: House/apartment  PLOF:  Level of assistance: Independent with ADLs, Needed assistance with IADLS Employment: Retired  PATIENT GOALS: improve Midwife and participation  OBJECTIVE:  Note: Objective measures were completed at Evaluation unless otherwise noted.  COGNITION: Overall cognitive status: Impaired Comments: Hx of MCI but limited intellectual awareness of cognitive deficits; endorsed he is repeating self in conversation d/t impaired recall. Pt requested usual repetition in conversation today to aid recall and processing. Denied any functional cognitive deficits related to iADL management (reports wife is managing)  MOTOR SPEECH: Overall motor speech: impaired Level of impairment: Conversation Respiration: thoracic breathing and clavicular breathing Phonation: normal Resonance: WFL Articulation: Appears intact Intelligibility: Intelligible Effective technique: slow rate, increased vocal intensity, over articulate, pause, and pacing Comments: low intensity, occasional  stutter reported  VERBAL EXPRESSION: Level of generative/spontaneous verbalization: conversation Automatic speech: name: intact and social response: intact  Repetition: Appears intact Naming: Confrontation: 51-75% Interfering components: attention and memory Effective technique: semantic cues, sentence completion, phonemic cues, and written cues Comments: Intermittent word finding episode x1 in conversation, with  self-initiating description strategy. Multiple episodes of anomia/dysnomia during BNT with adequate response to varied cues above   STANDARDIZED ASSESSMENTS: B[place]ON NAMING TEST: 51/60  ORAL MOTOR EXAMINATION: Overall status: WFL  PATIENT REPORTED OUTCOME MEASURES (PROM): Communication Participation Item Bank: 20                                                                                                                            TREATMENT DATE:  11/23/23:  11/16/23: Educated patient on role of ST. Identified personally relevant goals, including to increase participation in conversation and overall confidence communicating. Recommended increased engagement in conversation at home. Pt usually benefited from extra time and description strategy to aid word finding and processing, which SLP affirmed positive compensatory measures in place.    PATIENT EDUCATION: Education details: see above Person educated: Patient Education method: Explanation Education comprehension: verbalized understanding and needs further education   GOALS: Goals reviewed with patient? Yes  SHORT TERM GOALS: Target date: 12/14/2023  Pt will recall and teach back word retrieval strategies x2 with rare min A Baseline: Goal status: INITIAL  2.  Pt will utilize word retrieval strategies in unstructured conversations x2 given rare min A  Baseline:  Goal status: INITIAL  3.  Pt will effectively utilize attention/processing strategies to aid topic maintenance in conversations x2 given rare min A Baseline:  Goal status: INITIAL  4.  Pt will carryover cognitive compensations to recall of pertinent information given rare min A  Baseline:  Goal status: INITIAL   LONG TERM GOALS: Target date: 01/11/2024  Pt will consistently utilize trained speech strategies in unstructured conversations x2 with rare min A  Baseline:  Goal status: INITIAL  2.  Pt will report successful carryover speech compensations in  communication exchanges x3 outside therapy  Baseline:  Goal status: INITIAL  3. Pt will carryover cognitive compensations to aid recall of pertinent information/tasks at home given rare min A from family  Baseline: Goal status: INITIAL  4.   Pt will report improved communication effectiveness via PROM by 2 pts by LTG date Baseline: CPIB=20 Goal status: INITIAL   ASSESSMENT:  CLINICAL IMPRESSION: Patient is a 73 y.o. M who was seen today for ST evaluation related to Parkinsonism. Reported difficulty recalling his previously stated information in conversation, intermittent word retrieval difficulty, and occasional stutter which has impacted pt's confidence communicating and participating in conversation at home. In conversation today, pt presented with mildly delayed processing speech, reduced attention/recall of topic/question, rare word finding x1, and mildly decreased conversational volume. Pt appropriately but frequently requested repetition in conversation to support recall and processing. Demonstrated ability to describe during word finding episode with occasional min A.  Endorsed word finding episodes ~1-2x/day. Pt would benefit from skilled ST intervention to address  aforementioned deficits given functional impact on patient's life participation and engagement in conversation.  OBJECTIVE IMPAIRMENTS: Objective impairments include attention, memory, executive functioning, and expressive language. These impairments are limiting patient from household responsibilities and effectively communicating at home and in community.Factors affecting potential to achieve goals and functional outcome are co-morbidities and medical prognosis.. Patient will benefit from skilled SLP services to address above impairments and improve overall function.  REHAB POTENTIAL: Good  PLAN:  SLP FREQUENCY: 2x/week  SLP DURATION: 8 weeks (extended for scheduling)  PLANNED INTERVENTIONS: Diet toleration  management , Language facilitation, Environmental controls, Cueing hierachy, Cognitive reorganization, Internal/external aids, Functional tasks, SLP instruction and feedback, Compensatory strategies, Patient/family education, [phone] Treatment of speech (30 or 45 min) , [id number] Treatment of swallowing function, and [id number]- Speech [address] [place], Phon, Eval Compre, Express    [employer], CCC-SLP 11/22/2023, 2:18 PM

## 2023-11-23 ENCOUNTER — Ambulatory Visit

## 2023-11-23 ENCOUNTER — Other Ambulatory Visit: Payer: Self-pay | Admitting: Family Medicine

## 2023-11-23 ENCOUNTER — Encounter: Payer: Self-pay | Admitting: Family Medicine

## 2023-11-23 ENCOUNTER — Other Ambulatory Visit (HOSPITAL_COMMUNITY): Payer: Self-pay

## 2023-11-23 MED ORDER — ACETAMINOPHEN-CODEINE 300-30 MG PO TABS
1.0000 | ORAL_TABLET | Freq: Three times a day (TID) | ORAL | 0 refills | Status: DC
  Filled 2023-11-23: qty 15, 5d supply, fill #0

## 2023-11-24 ENCOUNTER — Other Ambulatory Visit: Payer: Self-pay

## 2023-11-25 ENCOUNTER — Encounter: Payer: Self-pay | Admitting: Speech Pathology

## 2023-11-25 ENCOUNTER — Ambulatory Visit: Admitting: Speech Pathology

## 2023-11-25 NOTE — Therapy (Signed)
 [facility] [address] [place], [place], [address] Phone: [phone]   Fax:  [phone]  Patient Details  Name: GIVEN CHIDESTER MRN: [id number] Date of Birth: [birth year] Referring Provider:  No ref. provider found  Encounter Date: 11/25/2023  SPEECH THERAPY DISCHARGE SUMMARY  Visits from Start of Care: 1 (eval only)  Current functional level related to goals / functional outcomes: Pt did not return since evaluation. Per spouse, planning to request new referral with improvement of other medical issues.    Remaining deficits: Cognitive communication deficit; Expressive language impairment; Dysarthria and anarthria   Education / Equipment: Role of ST in PD management, anomia compensations   Patient agrees to discharge. Patient goals were not met. Patient is being discharged due to not returning since the last visit.  Alston Jerry, CCC-SLP 11/25/2023, 2:52 PM  [facility] [address] [place], [place], [address] Phone: [phone]   Fax:  [phone]

## 2023-11-29 ENCOUNTER — Other Ambulatory Visit: Payer: Self-pay

## 2023-11-29 ENCOUNTER — Other Ambulatory Visit: Payer: Self-pay | Admitting: Family Medicine

## 2023-11-29 ENCOUNTER — Encounter (HOSPITAL_BASED_OUTPATIENT_CLINIC_OR_DEPARTMENT_OTHER): Payer: Self-pay

## 2023-11-29 ENCOUNTER — Other Ambulatory Visit (HOSPITAL_COMMUNITY): Payer: Self-pay

## 2023-11-29 DIAGNOSIS — H401123 Primary open-angle glaucoma, left eye, severe stage: Secondary | ICD-10-CM | POA: Diagnosis not present

## 2023-11-29 DIAGNOSIS — H18231 Secondary corneal edema, right eye: Secondary | ICD-10-CM | POA: Diagnosis not present

## 2023-11-29 DIAGNOSIS — H4089 Other specified glaucoma: Secondary | ICD-10-CM | POA: Diagnosis not present

## 2023-11-29 DIAGNOSIS — H04123 Dry eye syndrome of bilateral lacrimal glands: Secondary | ICD-10-CM | POA: Diagnosis not present

## 2023-11-29 DIAGNOSIS — H538 Other visual disturbances: Secondary | ICD-10-CM | POA: Diagnosis not present

## 2023-11-29 DIAGNOSIS — H34832 Tributary (branch) retinal vein occlusion, left eye, with macular edema: Secondary | ICD-10-CM | POA: Diagnosis not present

## 2023-11-29 MED ORDER — IRBESARTAN 75 MG PO TABS
75.0000 mg | ORAL_TABLET | Freq: Every morning | ORAL | 2 refills | Status: DC
  Filled 2023-12-04 – 2023-12-07 (×2): qty 30, 30d supply, fill #0
  Filled 2023-12-27 – 2024-01-04 (×3): qty 30, 30d supply, fill #1
  Filled 2024-01-24 – 2024-02-09 (×2): qty 30, 30d supply, fill #2

## 2023-11-29 MED ORDER — IBUPROFEN 600 MG PO TABS
600.0000 mg | ORAL_TABLET | Freq: Three times a day (TID) | ORAL | 2 refills | Status: DC
  Filled 2023-11-29 – 2023-12-03 (×5): qty 90, 30d supply, fill #0

## 2023-11-29 MED ORDER — DORZOLAMIDE HCL-TIMOLOL MAL 2-0.5 % OP SOLN
1.0000 [drp] | Freq: Two times a day (BID) | OPHTHALMIC | 11 refills | Status: DC
  Filled 2023-11-29 – 2023-12-01 (×2): qty 10, 50d supply, fill #0

## 2023-11-29 MED ORDER — PANTOPRAZOLE SODIUM 40 MG PO TBEC
40.0000 mg | DELAYED_RELEASE_TABLET | Freq: Two times a day (BID) | ORAL | 2 refills | Status: AC
  Filled 2023-12-04: qty 180, 90d supply, fill #0
  Filled 2023-12-07: qty 60, 30d supply, fill #0
  Filled 2023-12-27 – 2024-01-04 (×3): qty 60, 30d supply, fill #1
  Filled 2024-01-24 – 2024-03-14 (×5): qty 60, 30d supply, fill #2
  Filled 2024-04-12: qty 60, 30d supply, fill #3
  Filled 2024-04-28 – 2024-05-09 (×2): qty 60, 30d supply, fill #4
  Filled 2024-06-05: qty 60, 30d supply, fill #5
  Filled 2024-08-08: qty 60, 30d supply, fill #7
  Filled ????-??-??: fill #6

## 2023-11-30 ENCOUNTER — Other Ambulatory Visit (HOSPITAL_COMMUNITY): Payer: Self-pay

## 2023-11-30 ENCOUNTER — Ambulatory Visit: Admitting: Cardiology

## 2023-11-30 ENCOUNTER — Ambulatory Visit: Attending: Cardiology | Admitting: Cardiology

## 2023-11-30 ENCOUNTER — Encounter: Payer: Self-pay | Admitting: Cardiology

## 2023-11-30 VITALS — Ht 69.0 in | Wt 149.2 lb

## 2023-11-30 DIAGNOSIS — G903 Multi-system degeneration of the autonomic nervous system: Secondary | ICD-10-CM | POA: Diagnosis not present

## 2023-11-30 DIAGNOSIS — H348192 Central retinal vein occlusion, unspecified eye, stable: Secondary | ICD-10-CM | POA: Insufficient documentation

## 2023-11-30 DIAGNOSIS — M5416 Radiculopathy, lumbar region: Secondary | ICD-10-CM | POA: Diagnosis not present

## 2023-11-30 DIAGNOSIS — E785 Hyperlipidemia, unspecified: Secondary | ICD-10-CM

## 2023-11-30 DIAGNOSIS — I10 Essential (primary) hypertension: Secondary | ICD-10-CM | POA: Diagnosis not present

## 2023-11-30 DIAGNOSIS — I700 Atherosclerosis of aorta: Secondary | ICD-10-CM | POA: Diagnosis not present

## 2023-11-30 DIAGNOSIS — Z01810 Encounter for preprocedural cardiovascular examination: Secondary | ICD-10-CM | POA: Diagnosis not present

## 2023-11-30 DIAGNOSIS — Z8249 Family history of ischemic heart disease and other diseases of the circulatory system: Secondary | ICD-10-CM | POA: Diagnosis not present

## 2023-11-30 DIAGNOSIS — H409 Unspecified glaucoma: Secondary | ICD-10-CM | POA: Insufficient documentation

## 2023-11-30 NOTE — Progress Notes (Signed)
 Cardiology Office Note:  .   Date:  12/03/2023  ID:  [name], DOB March 13, 1951, MRN [id number] PCP: Watson Hacking, MD  [facility] Providers Cardiologist:  Randene Bustard, MD    Seward Dao Alleen Arbour, MD (Ophthalmology)   Johny Nap, NP (Neurology)  Emmalene Hare, MD (General Surg)  Amada Backer, MD Mort Ards, MD (Orthopedic Surg) -with Dr. Rexanne Catalina as [facility]  Chief Complaint  Patient presents with   Follow-up    Issues with orthostatic hypotension and intermittent hypertension    Patient Profile: .     [name] is a  73 y.o. male  with a PMH notable for ~aortic atherosclerosis, HTN, HLD (statin intolerant-on Livalo ) and family history of CAD as well as Parkinson's disease (with some dementia) who presents here for routine follow-up, to evaluate low blood pressures.  He presents here today for follow-up at the request of Watson Hacking, MD.  Notable PMH:  Moderate/labile HTN-now complicated by dysautonomia with labile blood pressure variations, supine hypertension and orthostatic hypotension limiting treatment HLD Aortic atherosclerosis Parkinson's disease now with more progressive limitations. Lumbar spine disease     [name] was last seen on January 15, 2023 by Friddie Jetty, NP: He had no new complaints.  Rare fluttering sensations but otherwise doing well.  No chest pain or dizziness.  Activity now limited by gait instability.  Subjective  Discussed the use of AI scribe software for clinical note transcription with the patient, who gave verbal consent to proceed.  History of Present Illness [name] "[name]" is a 73 year old male with Parkinson's disease who presents with fluctuating blood pressure. He is accompanied by his wife, who is a Engineer, civil (consulting).  He has been experiencing significant fluctuations in blood pressure, initially noticed during a visit for a retinal problem. His primary care doctor adjusted his medications, discontinuing one,  possibly valsartan, and currently he is on irbesartan  and verapamil . He experiences dizziness upon standing due to significant drops in blood pressure, although he has not fainted. His wife reports that he has cold hands, which may be related to blood pressure regulation issues.  He has a history of back pain and previous back surgery. He is currently consulting with a neurosurgeon due to severe pain that impairs his mobility. Despite the pain, he has been able to walk a mile, but recent blood pressure issues have made him cautious.  No recent chest pain, pressure, or tightness. He was previously prescribed nitroglycerin  but has not used it in months. He has a history of aortic ectasia but no recent cardiac issues that would contraindicate surgery.    Objective   Meds Taking: Atorvastatin  40 mg daily Avapro  75 mg every morning, verapamil  120 mg every morning  Sinemet  25-100 mg daily; rivastigmine  4.5 mg twice daily; trihexyphenidyl  2 mg-take 1/2  tab twice daily; Gemtesa  75 mg daily Myrbetriq  50 mg daily Celexa  20 mg daily Cosopt  eyedrops 2-0.5% 1 drop both eyes twice daily; Rhopressa  (netarsudil -latanoprost ) 0.2-0.005% left eye Uroxatrol 10 mg nightly Protonix  40 mg twice daily  Studies Reviewed: [name]  Old italics       Lab Results  Component Value Date   CHOL 136 01/19/2023   HDL 55 01/19/2023   LDLCALC 72 01/19/2023   TRIG 37 01/19/2023   CHOLHDL 2.5 01/19/2023   Lab Results  Component Value Date   NA 136 01/19/2023   K 4.5 01/19/2023   CREATININE 1.26 01/19/2023   EGFR 60 01/19/2023  GLUCOSE 88 01/19/2023    ECHO (June 2022): Normal LVEF 60 to 65%.  No RWMA.  GR 2 DD with moderate LA dilation.  Mild RV dilation but unable to assess PAP.  Normal RAP.  AOV sclerosis (mild to moderate) but no stenosis.  CT Angiogram-Abdomen-Pelvis (02/16/2023): Mild aortoiliac disease including mild ectasia of the left internal iliac artery  Risk Assessment/Calculations:           Physical Exam:   VS:  Ht 5\' 9"  (1.753 m)   Wt 149 lb 3.2 oz (67.7 kg)   SpO2 100%   BMI 22.03 kg/m    Wt Readings from Last 3 Encounters:  11/30/23 149 lb 3.2 oz (67.7 kg)  10/14/23 149 lb (67.6 kg)  05/17/23 151 lb 3.2 oz (68.6 kg)  Orthostatic Blood Pressures: Supine 172/100-60 bpm; sitting 151/94-65; standing 122/84-69--standing 3 minutes 107/76-69 with lightheadedness  GEN: Well nourished, well groomed in no acute distress; otherwise healthy appearing. NECK: No JVD; No carotid bruits CARDIAC: Normal S1, S2; RRR, no murmurs, rubs, gallops RESPIRATORY:  Clear to auscultation without rales, wheezing or rhonchi ; nonlabored, good air movement. ABDOMEN: Soft, non-tender, non-distended EXTREMITIES:  No edema; No deformity      ASSESSMENT AND PLAN: .    Problem List Items Addressed This Visit       Cardiology Problems   Abdominal aortic atherosclerosis (HCC) (Chronic)   No evidence of a AAA.  Mild aortoiliac disease and mild ectasia of the left internal iliac artery but no significant dilation. Can follow-up every couple years.      Relevant Orders   EKG 12-Lead (Completed)   Hyperlipidemia with target LDL less than 100 (Chronic)   Intolerant of most statins but seems to be doing relatively well with 20 mg Lipitor. With the LDL of 72, doing well on the current dose of Lipitor.  No change.      Relevant Orders   EKG 12-Lead (Completed)   Moderate essential hypertension (Chronic)   Quite problematic and the fact that he has pretty dramatic dysautonomia with significant 50 mmHg drop in blood pressures from sitting to standing. Need to remove AV nodal agents to allow for heart rate control since the heart rate did not move at all with dramatic drops in pressure. Allow for permissive hypertension.      Parkinson's disease with neurogenic orthostatic hypotension (HCC) - Primary (Chronic)   Parkinson's causing autonomic instability and orthostatic hypotension. Walking and  exercise crucial for muscle tone and blood flow. Cane or walker advised for balance. - Encourage walking and exercise to improve muscle tone and blood flow. - Advise use of a cane or walker for balance during longer walks. - Recommend wearing gloves at night for cold hands. - Suggest use of a back brace or abdominal binder for support.  Orthostatic hypotension most likely exacerbated by verapamil . - Discontinue verapamil  to allow heart rate compensation. - Encourage hydration with 70 ounces of water  daily. - Advise use of rehydrating drinks. - Recommend wearing pressure socks. - Instruct to sit with legs bent and elevated when possible. - Advise against standing with knees locked. - Encourage getting bearings before walking after standing.      Supine hypertension (Chronic)   Supine hypertension with significant postural blood pressure drop, related to Parkinson's and autonomic instability. Manage based on sitting/standing measurements. Inform surgical team to avoid over-treating supine hypertension. - Instruct to check blood pressure sitting or standing, not lying down. - Advise against treating supine hypertension unless necessary. -  Recommend short-acting blood pressure medications during surgery if needed. - Educate surgical team to expect higher supine blood pressure and not to over-treat.        Other   Family history of heart disease in male family member before age 99 (Chronic)   Relevant Orders   EKG 12-Lead (Completed)   Lumbar radiculopathy (Chronic)   Severe back pain limiting mobility. Previous surgery successful; current pain may warrant another surgery. No cardiac contraindication for surgery, which may improve mobility. - Support decision to proceed with back surgery to improve mobility. - Advise discussing anesthesia options with anesthesiologist, considering Parkinson's and blood pressure issues. - Recommend using a walker with a seat for longer walks to prevent  falls.      Preop cardiovascular exam      Mr. Vitiello perioperative risk of a major cardiac event is 0.9% according to the Revised Cardiac Risk Index (RCRI).  Therefore, he is at low risk for perioperative complications.   His functional capacity is fair at 5.84 METs according to the Duke Activity Status Index (DASI). Recommendations: According to ACC/AHA guidelines, no further cardiovascular testing needed.  The patient may proceed to surgery at acceptable risk.   Antiplatelet and/or Anticoagulation Recommendations: Not on any medications that need to be held No ischemic evaluation required.               Follow-Up: Return in about 8 weeks (around 01/26/2024) for Routine follow up with me, [facility]. - Schedule follow-up appointment on July 2nd to assess progress. - Cancel June 26th appointment if not needed. - Ensure neurosurgery and anesthesiology teams read cardiology notes before surgery.  Total time spent: 30 min spent with patient + 24 min spent charting = 54 min Recording duration: 30 minutes I spent 54 minutes in the care of [name] today including reviewing labs (1 minute), reviewing studies (4 minutes reviewing previous echo and CT scans), face to face time discussing treatment options (30 minutes), reviewing records from APP and PCP clinic notes (7 minutes), and 12 minutes dictating and documenting in the encounter.     Signed, Arleen Lacer, MD, MS Randene Bustard, M.D., M.S. Interventional Chartered certified accountant  Pager # [phone]

## 2023-11-30 NOTE — Patient Instructions (Signed)
 Medication Instructions:   Stop taking verapamil  tablet   *If you need a refill on your cardiac medications before your next appointment, please call your pharmacy*   Lab Work:  Not needed   Other Instructions   Recommend you wearing compression socks/hose  Wearing back brace Or wearing abdomen binder   Follow-Up: At [facility], you and your health needs are our priority.  As part of our continuing mission to provide you with exceptional heart care, we have created designated Provider Care Teams.  These Care Teams include your primary Cardiologist (physician) and Advanced Practice Providers (APPs -  Physician Assistants and Nurse Practitioners) who all work together to provide you with the care you need, when you need it.     Your next appointment:   Keep your appt with Dr Addie Holstein in [place]    The format for your next appointment:   In Person  Provider:   You may see Randene Bustard, MD      Other Instructions    Per Dr Addie Holstein you are cleared for back surgery , please have surgeon's office  to contact cardiology for official clearance.  Have Neurology and anesthesiology  to read Dr Addie Holstein Office note from 11/30/23

## 2023-12-01 ENCOUNTER — Ambulatory Visit: Payer: Medicare Other | Admitting: Family Medicine

## 2023-12-01 ENCOUNTER — Other Ambulatory Visit: Payer: Self-pay

## 2023-12-01 ENCOUNTER — Other Ambulatory Visit (HOSPITAL_COMMUNITY): Payer: Self-pay

## 2023-12-01 DIAGNOSIS — K219 Gastro-esophageal reflux disease without esophagitis: Secondary | ICD-10-CM

## 2023-12-01 DIAGNOSIS — M48062 Spinal stenosis, lumbar region with neurogenic claudication: Secondary | ICD-10-CM

## 2023-12-01 DIAGNOSIS — Z0000 Encounter for general adult medical examination without abnormal findings: Secondary | ICD-10-CM

## 2023-12-01 DIAGNOSIS — H348192 Central retinal vein occlusion, unspecified eye, stable: Secondary | ICD-10-CM

## 2023-12-01 DIAGNOSIS — G20C Parkinsonism, unspecified: Secondary | ICD-10-CM

## 2023-12-01 DIAGNOSIS — F320 Major depressive disorder, single episode, mild: Secondary | ICD-10-CM

## 2023-12-01 DIAGNOSIS — H4010X1 Unspecified open-angle glaucoma, mild stage: Secondary | ICD-10-CM

## 2023-12-01 DIAGNOSIS — H02401 Unspecified ptosis of right eyelid: Secondary | ICD-10-CM

## 2023-12-01 DIAGNOSIS — I10 Essential (primary) hypertension: Secondary | ICD-10-CM

## 2023-12-01 DIAGNOSIS — H18231 Secondary corneal edema, right eye: Secondary | ICD-10-CM

## 2023-12-01 DIAGNOSIS — G3184 Mild cognitive impairment, so stated: Secondary | ICD-10-CM

## 2023-12-01 DIAGNOSIS — I700 Atherosclerosis of aorta: Secondary | ICD-10-CM

## 2023-12-01 DIAGNOSIS — H401121 Primary open-angle glaucoma, left eye, mild stage: Secondary | ICD-10-CM

## 2023-12-01 DIAGNOSIS — N3281 Overactive bladder: Secondary | ICD-10-CM

## 2023-12-02 ENCOUNTER — Other Ambulatory Visit (HOSPITAL_COMMUNITY): Payer: Self-pay

## 2023-12-03 ENCOUNTER — Encounter: Payer: Self-pay | Admitting: Cardiology

## 2023-12-03 ENCOUNTER — Other Ambulatory Visit (HOSPITAL_COMMUNITY): Payer: Self-pay

## 2023-12-03 DIAGNOSIS — Z01810 Encounter for preprocedural cardiovascular examination: Secondary | ICD-10-CM | POA: Insufficient documentation

## 2023-12-03 DIAGNOSIS — G903 Multi-system degeneration of the autonomic nervous system: Secondary | ICD-10-CM | POA: Insufficient documentation

## 2023-12-03 DIAGNOSIS — I10 Essential (primary) hypertension: Secondary | ICD-10-CM | POA: Insufficient documentation

## 2023-12-03 NOTE — Assessment & Plan Note (Signed)
 Intolerant of most statins but seems to be doing relatively well with 20 mg Lipitor. With the LDL of 72, doing well on the current dose of Lipitor.  No change.

## 2023-12-03 NOTE — Assessment & Plan Note (Signed)
  [name] perioperative risk of a major cardiac event is 0.9% according to the Revised Cardiac Risk Index (RCRI).  Therefore, he is at low risk for perioperative complications.   His functional capacity is fair at 5.84 METs according to the Duke Activity Status Index (DASI). Recommendations: According to ACC/AHA guidelines, no further cardiovascular testing needed.  The patient may proceed to surgery at acceptable risk.   Antiplatelet and/or Anticoagulation Recommendations: Not on any medications that need to be held No ischemic evaluation required.

## 2023-12-03 NOTE — Assessment & Plan Note (Signed)
 Supine hypertension with significant postural blood pressure drop, related to Parkinson's and autonomic instability. Manage based on sitting/standing measurements. Inform surgical team to avoid over-treating supine hypertension. - Instruct to check blood pressure sitting or standing, not lying down. - Advise against treating supine hypertension unless necessary. - Recommend short-acting blood pressure medications during surgery if needed. - Educate surgical team to expect higher supine blood pressure and not to over-treat.

## 2023-12-03 NOTE — Assessment & Plan Note (Signed)
 Quite problematic and the fact that he has pretty dramatic dysautonomia with significant 50 mmHg drop in blood pressures from sitting to standing. Need to remove AV nodal agents to allow for heart rate control since the heart rate did not move at all with dramatic drops in pressure. Allow for permissive hypertension.

## 2023-12-03 NOTE — Assessment & Plan Note (Signed)
 No evidence of a AAA.  Mild aortoiliac disease and mild ectasia of the left internal iliac artery but no significant dilation. Can follow-up every couple years.

## 2023-12-03 NOTE — Assessment & Plan Note (Signed)
 Parkinson's causing autonomic instability and orthostatic hypotension. Walking and exercise crucial for muscle tone and blood flow. Cane or walker advised for balance. - Encourage walking and exercise to improve muscle tone and blood flow. - Advise use of a cane or walker for balance during longer walks. - Recommend wearing gloves at night for cold hands. - Suggest use of a back brace or abdominal binder for support.  Orthostatic hypotension most likely exacerbated by verapamil . - Discontinue verapamil  to allow heart rate compensation. - Encourage hydration with 70 ounces of water  daily. - Advise use of rehydrating drinks. - Recommend wearing pressure socks. - Instruct to sit with legs bent and elevated when possible. - Advise against standing with knees locked. - Encourage getting bearings before walking after standing.

## 2023-12-03 NOTE — Assessment & Plan Note (Signed)
 Severe back pain limiting mobility. Previous surgery successful; current pain may warrant another surgery. No cardiac contraindication for surgery, which may improve mobility. - Support decision to proceed with back surgery to improve mobility. - Advise discussing anesthesia options with anesthesiologist, considering Parkinson's and blood pressure issues. - Recommend using a walker with a seat for longer walks to prevent falls.

## 2023-12-04 ENCOUNTER — Other Ambulatory Visit (HOSPITAL_COMMUNITY): Payer: Self-pay

## 2023-12-06 ENCOUNTER — Other Ambulatory Visit (HOSPITAL_COMMUNITY): Payer: Self-pay

## 2023-12-06 ENCOUNTER — Other Ambulatory Visit: Payer: Self-pay

## 2023-12-06 DIAGNOSIS — M4316 Spondylolisthesis, lumbar region: Secondary | ICD-10-CM | POA: Diagnosis not present

## 2023-12-06 DIAGNOSIS — M48062 Spinal stenosis, lumbar region with neurogenic claudication: Secondary | ICD-10-CM | POA: Diagnosis not present

## 2023-12-07 ENCOUNTER — Other Ambulatory Visit: Payer: Self-pay

## 2023-12-07 ENCOUNTER — Other Ambulatory Visit (HOSPITAL_COMMUNITY): Payer: Self-pay

## 2023-12-07 MED ORDER — IBUPROFEN 600 MG PO TABS
600.0000 mg | ORAL_TABLET | Freq: Three times a day (TID) | ORAL | 2 refills | Status: AC | PRN
  Filled 2023-12-07: qty 90, 30d supply, fill #0
  Filled 2023-12-27 – 2024-01-04 (×3): qty 90, 30d supply, fill #1

## 2023-12-08 ENCOUNTER — Other Ambulatory Visit: Payer: Self-pay

## 2023-12-08 ENCOUNTER — Other Ambulatory Visit (HOSPITAL_COMMUNITY): Payer: Self-pay

## 2023-12-09 ENCOUNTER — Other Ambulatory Visit: Payer: Self-pay | Admitting: Neurosurgery

## 2023-12-12 ENCOUNTER — Encounter: Payer: Self-pay | Admitting: Family Medicine

## 2023-12-15 ENCOUNTER — Encounter: Payer: Self-pay | Admitting: Family Medicine

## 2023-12-15 ENCOUNTER — Other Ambulatory Visit (HOSPITAL_COMMUNITY): Payer: Self-pay

## 2023-12-15 NOTE — Pre-Procedure Instructions (Signed)
 Surgical Instructions   Your procedure is scheduled on December 31, 2023. Report to [facility] Main Entrance "A" at 5:30 A.M., then check in with the Admitting office. Any questions or running late day of surgery: call [phone]  Questions prior to your surgery date: call [phone], Monday-Friday, 8am-4pm. If you experience any cold or flu symptoms such as cough, fever, chills, shortness of breath, etc. between now and your scheduled surgery, please notify us  at the above number.     Remember:  Do not eat or drink after midnight the night before your surgery    Take these medicines the morning of surgery with A SIP OF WATER : alfuzosin  (UROXATRAL )  atorvastatin  (LIPITOR)  carbidopa -levodopa  (SINEMET  IR)  dorzolamide -timolol  (COSOPT ) ophthalmic solution  pantoprazole  (PROTONIX )  rivastigmine  (EXELON )  trihexyphenidyl  (ARTANE )  Vibegron  (GEMTESA )    May take these medicines IF NEEDED: acetaminophen  (TYLENOL )  acetaminophen -codeine  (TYLENOL  #3)  nitroGLYCERIN  (NITROSTAT ) - if dose taken prior to surgery, please call either of the above phone numbers   One week prior to surgery, STOP taking any Aspirin  (unless otherwise instructed by your surgeon) Aleve, Naproxen, Ibuprofen , Motrin , Advil , Goody's, BC's, all herbal medications, fish oil, and non-prescription vitamins.                     Do NOT Smoke (Tobacco/Vaping) for 24 hours prior to your procedure.  If you use a CPAP at night, you may bring your mask/headgear for your overnight stay.   You will be asked to remove any contacts, glasses, piercing's, hearing aid's, dentures/partials prior to surgery. Please bring cases for these items if needed.    Patients discharged the day of surgery will not be allowed to drive home, and someone needs to stay with them for 24 hours.  SURGICAL WAITING ROOM VISITATION Patients may have no more than 2 support people in the waiting area - these visitors may rotate.   Pre-op nurse will  coordinate an appropriate time for 1 ADULT support person, who may not rotate, to accompany patient in pre-op.  Children under the age of 68 must have an adult with them who is not the patient and must remain in the main waiting area with an adult.  If the patient needs to stay at the hospital during part of their recovery, the visitor guidelines for inpatient rooms apply.  Please refer to the [facility] website for the visitor guidelines for any additional information.   If you received a COVID test during your pre-op visit  it is requested that you wear a mask when out in public, stay away from anyone that may not be feeling well and notify your surgeon if you develop symptoms. If you have been in contact with anyone that has tested positive in the last 10 days please notify you surgeon.      Pre-operative 5 CHG Bathing Instructions   You can play a key role in reducing the risk of infection after surgery. Your skin needs to be as free of germs as possible. You can reduce the number of germs on your skin by washing with CHG (chlorhexidine  gluconate) soap before surgery. CHG is an antiseptic soap that kills germs and continues to kill germs even after washing.   DO NOT use if you have an allergy to chlorhexidine /CHG or antibacterial soaps. If your skin becomes reddened or irritated, stop using the CHG and notify one of our RNs at [phone].   Please shower with the CHG soap starting 4 days before  surgery using the following schedule:     Please keep in mind the following:  DO NOT shave, including legs and underarms, starting the day of your first shower.   You may shave your face at any point before/day of surgery.  Place clean sheets on your bed the day you start using CHG soap. Use a clean washcloth (not used since being washed) for each shower. DO NOT sleep with pets once you start using the CHG.   CHG Shower Instructions:  Wash your face and private area with normal soap. If  you choose to wash your hair, wash first with your normal shampoo.  After you use shampoo/soap, rinse your hair and body thoroughly to remove shampoo/soap residue.  Turn the water  OFF and apply about 3 tablespoons (45 ml) of CHG soap to a CLEAN washcloth.  Apply CHG soap ONLY FROM YOUR NECK DOWN TO YOUR TOES (washing for 3-5 minutes)  DO NOT use CHG soap on face, private areas, open wounds, or sores.  Pay special attention to the area where your surgery is being performed.  If you are having back surgery, having someone wash your back for you may be helpful. Wait 2 minutes after CHG soap is applied, then you may rinse off the CHG soap.  Pat dry with a clean towel  Put on clean clothes/pajamas   If you choose to wear lotion, please use ONLY the CHG-compatible lotions that are listed below.  Additional instructions for the day of surgery: DO NOT APPLY any lotions, deodorants, cologne, or perfumes.   Do not bring valuables to the hospital. [facility] is not responsible for any belongings/valuables. Do not wear nail polish, gel polish, artificial nails, or any other type of covering on natural nails (fingers and toes) Do not wear jewelry or makeup Put on clean/comfortable clothes.  Please brush your teeth.  Ask your nurse before applying any prescription medications to the skin.     CHG Compatible Lotions   Aveeno Moisturizing lotion  Cetaphil Moisturizing Cream  Cetaphil Moisturizing Lotion  Clairol Herbal Essence Moisturizing Lotion, Dry Skin  Clairol Herbal Essence Moisturizing Lotion, Extra Dry Skin  Clairol Herbal Essence Moisturizing Lotion, Normal Skin  Curel Age Defying Therapeutic Moisturizing Lotion with Alpha Hydroxy  Curel Extreme Care Body Lotion  Curel Soothing Hands Moisturizing Hand Lotion  Curel Therapeutic Moisturizing Cream, Fragrance-Free  Curel Therapeutic Moisturizing Lotion, Fragrance-Free  Curel Therapeutic Moisturizing Lotion, Original Formula  Eucerin  Daily Replenishing Lotion  Eucerin Dry Skin Therapy Plus Alpha Hydroxy Crme  Eucerin Dry Skin Therapy Plus Alpha Hydroxy Lotion  Eucerin Original Crme  Eucerin Original Lotion  Eucerin Plus Crme Eucerin Plus Lotion  Eucerin TriLipid Replenishing Lotion  Keri Anti-Bacterial Hand Lotion  Keri Deep Conditioning Original Lotion Dry Skin Formula Softly Scented  Keri Deep Conditioning Original Lotion, Fragrance Free Sensitive Skin Formula  Keri Lotion Fast Absorbing Fragrance Free Sensitive Skin Formula  Keri Lotion Fast Absorbing Softly Scented Dry Skin Formula  Keri Original Lotion  Keri Skin Renewal Lotion Keri Silky Smooth Lotion  Keri Silky Smooth Sensitive Skin Lotion  Nivea Body Creamy Conditioning Oil  Nivea Body Extra Enriched Lotion  Nivea Body Original Lotion  Nivea Body Sheer Moisturizing Lotion Nivea Crme  Nivea Skin Firming Lotion  NutraDerm 30 Skin Lotion  NutraDerm Skin Lotion  NutraDerm Therapeutic Skin Cream  NutraDerm Therapeutic Skin Lotion  ProShield Protective Hand Cream  Provon moisturizing lotion  Please read over the following fact sheets that you were given.

## 2023-12-16 ENCOUNTER — Encounter (HOSPITAL_COMMUNITY)
Admission: RE | Admit: 2023-12-16 | Discharge: 2023-12-16 | Disposition: A | Discharge status: Home / Self Care | Source: Ambulatory Visit | Attending: Neurosurgery | Admitting: Neurosurgery

## 2023-12-16 ENCOUNTER — Other Ambulatory Visit: Payer: Self-pay

## 2023-12-16 ENCOUNTER — Other Ambulatory Visit (HOSPITAL_COMMUNITY): Payer: Self-pay

## 2023-12-16 ENCOUNTER — Encounter (HOSPITAL_COMMUNITY): Payer: Self-pay

## 2023-12-16 VITALS — BP 137/101 | HR 58 | Temp 97.7°F | Resp 17 | Ht 69.0 in | Wt 150.0 lb

## 2023-12-16 DIAGNOSIS — K769 Liver disease, unspecified: Secondary | ICD-10-CM | POA: Diagnosis not present

## 2023-12-16 DIAGNOSIS — Z01812 Encounter for preprocedural laboratory examination: Secondary | ICD-10-CM | POA: Insufficient documentation

## 2023-12-16 DIAGNOSIS — Z01818 Encounter for other preprocedural examination: Secondary | ICD-10-CM

## 2023-12-16 HISTORY — DX: Sleep apnea, unspecified: G47.30

## 2023-12-16 HISTORY — DX: Depression, unspecified: F32.A

## 2023-12-16 HISTORY — DX: Anxiety disorder, unspecified: F41.9

## 2023-12-16 HISTORY — DX: Myoneural disorder, unspecified: G70.9

## 2023-12-16 LAB — CBC
HCT: 41.6 % (ref 39.0–52.0)
Hemoglobin: 13.6 g/dL (ref 13.0–17.0)
MCH: 30.1 pg (ref 26.0–34.0)
MCHC: 32.7 g/dL (ref 30.0–36.0)
MCV: 92 fL (ref 80.0–100.0)
Platelets: 228 10*3/uL (ref 150–400)
RBC: 4.52 MIL/uL (ref 4.22–5.81)
RDW: 12.7 % (ref 11.5–15.5)
WBC: 5.8 10*3/uL (ref 4.0–10.5)
nRBC: 0 % (ref 0.0–0.2)

## 2023-12-16 LAB — COMPREHENSIVE METABOLIC PANEL WITH GFR
ALT: 13 U/L (ref 0–44)
AST: 23 U/L (ref 15–41)
Albumin: 4.2 g/dL (ref 3.5–5.0)
Alkaline Phosphatase: 56 U/L (ref 38–126)
Anion gap: 7 (ref 5–15)
BUN: 24 mg/dL — ABNORMAL HIGH (ref 8–23)
CO2: 25 mmol/L (ref 22–32)
Calcium: 9.6 mg/dL (ref 8.9–10.3)
Chloride: 104 mmol/L (ref 98–111)
Creatinine, Ser: 1.1 mg/dL (ref 0.61–1.24)
GFR, Estimated: >60 mL/min (ref >60)
Glucose, Bld: 90 mg/dL (ref 70–99)
Potassium: 4 mmol/L (ref 3.5–5.1)
Sodium: 136 mmol/L (ref 135–145)
Total Bilirubin: 0.8 mg/dL (ref 0.0–1.2)
Total Protein: 7.3 g/dL (ref 6.5–8.1)

## 2023-12-16 LAB — SURGICAL PCR SCREEN
MRSA, PCR: NEGATIVE
Staphylococcus aureus: NEGATIVE

## 2023-12-16 LAB — TYPE AND SCREEN
ABO/RH(D): B POS
Antibody Screen: NEGATIVE

## 2023-12-16 NOTE — Progress Notes (Addendum)
 Anesthesia APP PAT Evaluation  Case: [id number] Date/Time: 12/31/23 0715   Procedures:      ANTERIOR LATERAL LUMBAR FUSION 2 LEVELS - Prone trans-psoas interbody fusion  - L3-L4 - L4-L5, right facetectomy L4-5, PSI L3-L5, O-ARM     APPLICATION OF O-ARM     LUMBAR PERCUTANEOUS PEDICLE SCREW 2 LEVEL   Anesthesia type: General   Diagnosis: Spondylolisthesis, lumbar region [M43.16]   Pre-op diagnosis: SPONDYLOSIS, LUMBAR REGION   [facility]   Surgeons: Augusto Blonder, MD       DISCUSSION: Patient is a 73 year old male scheduled for the above procedure.  History includes former smoker (quit 1990), Parkinson's disease, HTN (now with neurogenic orthostatic hypotension r/t Parkinson's disease), PVD, HLD, glaucoma, GERD, Hepatitis C (s/p Harvoni), BPH, OSA (uses CPAP), ascending TAA (4.2 cm 10/08/16 CTA; 3.10 cm TAA and 3.60 cm aortic root 01/07/21 TTE).  For anesthesia history, he has had post-operative urinary retention and post-operative delirium. His wife ia a Charity fundraiser, retired from [employer]. They are particularly concerned about over-treatment of hypertension could lead to worsening hypotension. He denied syncope, but has experienced positional dizziness and had some falls. They also request shortest acting medications as much as possible in hopes to minimize post-operative delirium.  His delirium was the worst after his endoscopy procedures in 2022. He did have some hallucinations after he was discharged home following 07/03/21 back surgery.   Meds include Sinemet , Lipitor, alfuzosin , irbesartan  Q AM, Protonix , rivastigmine , trihexphenidyl, vibegron . Verapamil  was discontinued recently by cardiology due to orthostatic hypotension.   He had preoperative evaluation by cardiologist Dr. Addie Holstein on 11/30/23. He notes moderate essential hypertension is "quite problematic and the fact that he has pretty dramatic dysautonomia with significant 50 mmHg drop in blood  pressures from sitting to standing. Need to remove AV nodal agents to allow for heart rate control since the heart rate did not move at all with dramatic drops in pressure. Allow for permissive hypertension." Parkinson's thought to be the cause of his autonomic instability or orthostatic hypotension. He discontinued verapamil . Other recommendations includes thing like hydration, exercise, cane or walker with longer walks, gloves for cold hands, back brace or abdominal binder for support.   In regards to upcoming surgery, Dr. Addie Holstein wrote: "Support decision to proceed with back surgery to improve mobility. - Advise discussing anesthesia options with anesthesiologist, considering Parkinson's and blood pressure issues."  "Inform surgical team to avoid over-treating supine hypertension. - Instruct to check blood pressure sitting or standing, not lying down. - Advise against treating supine hypertension unless necessary. - Recommend short-acting blood pressure medications during surgery if needed. - Educate surgical team to expect higher supine blood pressure and not to over-treat."  "Preop cardiovascular exam Mr. Markarian perioperative risk of a major cardiac event is 0.9% according to the Revised Cardiac Risk Index (RCRI).  Therefore, he is at low risk for perioperative complications.   His functional capacity is fair at 5.84 METs according to the Duke Activity Status Index (DASI). Recommendations: According to ACC/AHA guidelines, no further cardiovascular testing needed.  The patient may proceed to surgery at acceptable risk.   Antiplatelet and/or Anticoagulation Recommendations: Not on any medications that need to be held No ischemic evaluation required.   Discussed with anesthesiologist Gwenevere Lent, MD. Hold ARB for 24 hours before surgery. Would anticipate use of a-line for closer moniotring of BP. Discusse with patient and wife who are a very pleasant couple. They will speak further  with the  anesthesiologist on the day of surgery.    VS:  Wt Readings from Last 3 Encounters:  12/16/23 68 kg  11/30/23 67.7 kg  10/14/23 67.6 kg   BP Readings from Last 3 Encounters:  12/16/23 (!) 137/101  11/04/23 (!) 141/83  11/02/23 111/77   Pulse Readings from Last 3 Encounters:  12/16/23 (!) 58  11/04/23 (!) 55  11/02/23 63     PROVIDERS: Watson Hacking, MD is PCP  Randene Bustard, MD is cardiologist Ardella Beaver, MD is neurologist. More recently has primarily been seeing Johny Nap, NP, last visit 10/14/23 for Parkinsonism and OSA follow-up. Stable on Sinemet . On Exelon  for memory. He was interested in evaluating other CPAP masks for better fit and tolerance. Urologist is with [facility]   LABS: PAT ab results include: Lab Results  Component Value Date   WBC 5.8 12/16/2023   HGB 13.6 12/16/2023   HCT 41.6 12/16/2023   PLT 228 12/16/2023   GLUCOSE 90 12/16/2023   CHOL 136 01/19/2023   TRIG 37 01/19/2023   HDL 55 01/19/2023   LDLCALC 72 01/19/2023   ALT 13 12/16/2023   AST 23 12/16/2023   NA 136 12/16/2023   K 4.0 12/16/2023   CL 104 12/16/2023   CREATININE 1.10 12/16/2023   BUN 24 (H) 12/16/2023   CO2 25 12/16/2023   TSH 3.131 01/11/2021    Sleep Study 07/13/23: RECOMMENDATIONS:  1. This study demonstrates overall mild obstructive sleep apnea, with a total AHI of 11.5/h, supine AHI of 7.4/h, REM AHI of 8.1/h, O2 nadir 86%.  Given the patient's medical history and sleep related complaints, treatment with positive airway pressure is recommended; this can be achieved in the form of autoPAP. A full-night CPAP titration study would allow optimization of therapy if needed. Other treatment options may include avoidance of supine sleep position along with weight loss, or the use of an oral appliance in selected patients. Please note, that untreated obstructive sleep apnea may carry additional perioperative morbidity. Patients with significant obstructive sleep apnea  should receive perioperative PAP therapy and the surgeons and particularly the anesthesiologist should be informed of the diagnosis and the severity of the sleep disordered breathing...  IMAGES: CTA Abd/pelvis 02/16/23: IMPRESSION: - No acute finding.  - Mild aorta iliac disease, including mild ectasia of the left internal iliac artery. Aortic Atherosclerosis (ICD10-I70.0).   EKG: 11/30/23: Normal sinus rhythm at 60 bpm Left axis deviation Anteroseptal infarct (cited on or before 03-Jan-2021) When compared with ECG of 15-Jan-2023 15:53, No significant change was found   CV: Echo 01/07/21: IMPRESSIONS   1. Left ventricular ejection fraction, by estimation, is 60 to 65%. The  left ventricle has normal function. The left ventricle has no regional  wall motion abnormalities. Left ventricular diastolic parameters are  consistent with Grade II diastolic  dysfunction (pseudonormalization).   2. Right ventricular systolic function is normal. The right ventricular  size is mildly enlarged. Tricuspid regurgitation signal is inadequate for  assessing PA pressure.   3. Left atrial size was moderately dilated.   4. The mitral valve is normal in structure. No evidence of mitral valve  regurgitation. No evidence of mitral stenosis.   5. The aortic valve is tricuspid. Aortic valve regurgitation is trivial.  Mild to moderate aortic valve sclerosis/calcification is present, without  any evidence of aortic stenosis.   6. The inferior vena cava is normal in size with greater than 50%  respiratory variability, suggesting right atrial pressure of 3  mmHg.    Nuclear stress test 11/22/15: Nuclear stress EF: 69%. Blood pressure demonstrated a hypertensive response to exercise. Upsloping ST segment depression ST segment depression of 1 mm was noted during stress in the V5, V6, aVF, III and II leads, and returning to baseline after less than 1 minute of recovery. Defect 1: There is a small defect of mild  severity present in the basal anterolateral location. The study is normal. This is a low risk study.   Low risk stress nuclear study with a small fixed basal lateral defect, likely representing artifact. Otherwise normal perfusion and normal left ventricular regional and global systolic function.   Cardiac cath 01/03/04: Angiographically patent coronary artery (trivial early lesion in circumflex, not significant at this time). Normal LV systolic function. Normal renal arteries.   Past Medical History:  Diagnosis Date   Allergy    seasonal   Anxiety    Arthritis    BPH (benign prostatic hyperplasia)    Complication of anesthesia    Pt. stated he had a reaction that ended in him requiring urinary cath placement; hx delirium worsening parkinson symptoms   Depression    Diverticulosis    GERD (gastroesophageal reflux disease)    esophageal spasms   Glaucoma    Gout    Head injury, closed, with concussion    Hepatitis C    chronic - Has been treated with Harvoni   HLD (hyperlipidemia)    statin intolerant (Crestor  & Simvastatin ) - Taking Livalo  1mg  / week   Hypertension    Neuromuscular disorder (HCC)    Parkinson's Disease   Parkinson's disease (HCC)    Plantar fasciitis    right   PVD (peripheral vascular disease) (HCC)    With no claudication; only mild abdominal aortic atherosclerosis noted on ultrasound.   Sleep apnea    Wears CPAP nightly   Spinal stenosis of lumbar region    Thoracic ascending aortic aneurysm (HCC)    4.2 cm ascending TAA 09/2016 CT, 1 yr f/u rec   Ulcer     Past Surgical History:  Procedure Laterality Date   BUNIONECTOMY WITH WEIL OSTEOTOMY Right 11/02/2019   Procedure: Right Foot Lapidus, Modified McBride Bunionectomy,  Hallux Akin Osteotomy;  Surgeon: Amada Backer, MD;  Location: [facility];  Service: Orthopedics;  Laterality: Right;   CARDIAC CATHETERIZATION  2005   30% Cx. Dr. Ebbie Goldmann   cataract surgery Left 11/05/2014    COLONOSCOPY     COLONOSCOPY N/A 01/09/2021   Procedure: COLONOSCOPY;  Surgeon: Genell Ken, MD;  Location: [facility];  Service: Gastroenterology;  Laterality: N/A;   ESOPHAGOGASTRODUODENOSCOPY N/A 05/02/2015   Procedure: ESOPHAGOGASTRODUODENOSCOPY (EGD);  Surgeon: Lanita Pitman, MD;  Location: [facility];  Service: Endoscopy;  Laterality: N/A;   ESOPHAGOGASTRODUODENOSCOPY  05/02/2015   no source of pt chest pain endoscopically evident. small hiatal hernia.   ESOPHAGOGASTRODUODENOSCOPY (EGD) WITH PROPOFOL  N/A 01/02/2021   Procedure: ESOPHAGOGASTRODUODENOSCOPY (EGD) WITH PROPOFOL ;  Surgeon: [name] Blunt, MD;  Location: [facility];  Service: Endoscopy;  Laterality: N/A;   HARDWARE REMOVAL Right 11/02/2019   Procedure: Second Metatarsal Removal of Deep Implant and Rotational Osteotomy;  Surgeon: Amada Backer, MD;  Location: [facility];  Service: Orthopedics;  Laterality: Right;   IR ANGIOGRAM SELECTIVE EACH ADDITIONAL VESSEL  01/04/2021   IR ANGIOGRAM SELECTIVE EACH ADDITIONAL VESSEL  01/04/2021   IR ANGIOGRAM VISCERAL SELECTIVE  01/04/2021   IR US  GUIDE VASC ACCESS RIGHT  01/04/2021   LUMBAR LAMINECTOMY/DECOMPRESSION MICRODISCECTOMY N/A 07/03/2021  Procedure: Lumbar three through five decompression with lumbar three through five insitu fusion;  Surgeon: Mort Ards, MD;  Location: [facility] OR;  Service: Orthopedics;  Laterality: N/A;   MEMBRANE PEEL Left 03/14/2014   Procedure: MEMBRANE PEEL; ENDOLASER;  Surgeon: Shon Downing, MD;  Location: [facility];  Service: Ophthalmology;  Laterality: Left;   NM MYOVIEW  LTD  10/2015   LOW RISK. Small, fixed basal lateral defect - likely diaphragmatic attenuation. 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  No R WMA.  GRII DD-moderately dilated LA..  Mildly dilated RV but normal function.  Normal RAP/CVP.  Trivial AI with mild to moderate sclerosis-no stenosis   UMBILICAL HERNIA REPAIR  2023   UPPER GASTROINTESTINAL ENDOSCOPY     WEIL OSTEOTOMY Right 09/01/2017   Procedure: RIGHT GREAT TOE CHEVRON AND WEIL OSTEOTOMY 2ND METATARSAL;  Surgeon: Timothy Ford, MD;  Location: [facility];  Service: Orthopedics;  Laterality: Right;   XI ROBOTIC ASSISTED VENTRAL HERNIA N/A 03/17/2022   Procedure: XI ROBOTIC ASSISTED VENTRAL HERNIA;  Surgeon: Emmalene Hare, MD;  Location: [facility]S;  Service: General;  Laterality: N/A;    MEDICATIONS: No current facility-administered medications for this encounter.    acetaminophen  (TYLENOL ) 500 MG tablet   acetaminophen -codeine  (TYLENOL  #3) 300-30 MG tablet   alfuzosin  (UROXATRAL ) 10 MG 24 hr tablet   atorvastatin  (LIPITOR) 20 MG tablet   CALCIUM -VITAMIN D PO   carbidopa -levodopa  (SINEMET  IR) 25-100 MG tablet   citalopram  (CELEXA ) 20 MG tablet   dorzolamide -timolol  (COSOPT ) 22.3-6.8 MG/ML ophthalmic solution   FIBER ADULT GUMMIES PO   ibuprofen  (ADVIL ) 600 MG tablet   irbesartan  (AVAPRO ) 75 MG tablet   Multiple Vitamins-Minerals (MULTIVITAMIN ADULTS 50+ PO)   Netarsudil -Latanoprost  (ROCKLATAN ) 0.02-0.005 % SOLN   nitroGLYCERIN  (NITROSTAT ) 0.4 MG SL tablet   pantoprazole  (PROTONIX ) 40 MG tablet   rivastigmine  (EXELON ) 4.5 MG capsule   trihexyphenidyl  (ARTANE ) 2 MG tablet   Vibegron  (GEMTESA ) 75 MG TABS   ibuprofen  (ADVIL ) 600 MG tablet   influenza vaccine adjuvanted (FLUAD) 0.5 ML injection   MYRBETRIQ  50 MG TB24 tablet   Netarsudil  Dimesylate (RHOPRESSA ) 0.02 % SOLN   verapamil  (CALAN -SR) 120 MG CR tablet    Ella Gun, PA-C Surgical Short Stay/Anesthesiology [facility] Phone [phone] [facility] Phone [phone] 12/17/2023 2:50 PM     :

## 2023-12-16 NOTE — Progress Notes (Signed)
 PCP - Dr. Ron Cobbs Cardiologist - Dr. Randene Bustard - Last office note 11/30/2023 Neurologist - Johny Nap, NP with Dr. Janett Medin as primary MD  PPM/ICD - Denies Device Orders - n/a Rep Notified - n/a  Chest x-ray - Denies EKG - 11/30/2023 Stress Test - 11/22/2015 ECHO - 01/07/2021 Cardiac Cath - 01/03/2004  Sleep Study - +OSA. Pt wears CPAP nightly. Pressure setting is 4  No DM  Last dose of GLP1 agonist- n/a GLP1 instructions: n/a  Blood Thinner Instructions: n/a Aspirin  Instructions: n/a  NPO after midnight  COVID TEST- n/a   Anesthesia review: Yes. Cardiac clearance, HTN, Parkinson's OSA. Pt and pts wife spoke with Allison Zelenak, PA-C regarding delirium with anesthesia and labile BP.   Patient denies shortness of breath, fever, cough and chest pain at PAT appointment. Pt denies any respiratory illness/infection in the last two months.   All instructions explained to the patient and patients wife, [name], with a verbal understanding of the material. Patient agrees to go over the instructions while at home for a better understanding. Patient also instructed to self quarantine after being tested for COVID-19. The opportunity to ask questions was provided.

## 2023-12-17 NOTE — Anesthesia Preprocedure Evaluation (Addendum)
 Anesthesia Evaluation  Patient identified by MRN, date of birth, ID band Patient awake    Reviewed: Allergy & Precautions, H&P , NPO status , Patient's Chart, lab work & pertinent test results  History of Anesthesia Complications (+) Emergence Delirium and history of anesthetic complications  Airway Mallampati: II  TM Distance: >3 FB Neck ROM: Full    Dental  (+) Partial Lower, Partial Upper   Pulmonary sleep apnea , former smoker   Pulmonary exam normal breath sounds clear to auscultation       Cardiovascular hypertension, + Peripheral Vascular Disease  Normal cardiovascular exam Rhythm:Regular Rate:Normal  12/2020 TTE    1. Left ventricular ejection fraction, by estimation, is 60 to 65%. The  left ventricle has normal function. The left ventricle has no regional  wall motion abnormalities. Left ventricular diastolic parameters are  consistent with Grade II diastolic  dysfunction (pseudonormalization).   2. Right ventricular systolic function is normal. The right ventricular  size is mildly enlarged. Tricuspid regurgitation signal is inadequate for  assessing PA pressure.   3. Left atrial size was moderately dilated.   4. The mitral valve is normal in structure. No evidence of mitral valve  regurgitation. No evidence of mitral stenosis.   5. The aortic valve is tricuspid. Aortic valve regurgitation is trivial.  Mild to moderate aortic valve sclerosis/calcification is present, without  any evidence of aortic stenosis.   6. The inferior vena cava is normal in size with greater than 50%  respiratory variability, suggesting right atrial pressure of 3 mmHg.     Neuro/Psych  PSYCHIATRIC DISORDERS Anxiety Depression    Parkinson    GI/Hepatic ,GERD  ,,(+) Hepatitis -, C  Endo/Other  negative endocrine ROS    Renal/GU negative Renal ROS  negative genitourinary   Musculoskeletal  (+) Arthritis ,    Abdominal    Peds negative pediatric ROS (+)  Hematology negative hematology ROS (+)   Anesthesia Other Findings   Reproductive/Obstetrics negative OB ROS                             Anesthesia Physical Anesthesia Plan  ASA: 3  Anesthesia Plan: General   Post-op Pain Management: Ofirmev  IV (intra-op)*   Induction: Intravenous  PONV Risk Score and Plan: 2 and Ondansetron , Dexamethasone , Treatment may vary due to age or medical condition and TIVA  Airway Management Planned: Oral ETT  Additional Equipment:   Intra-op Plan:   Post-operative Plan: Extubation in OR  Informed Consent: I have reviewed the patients History and Physical, chart, labs and discussed the procedure including the risks, benefits and alternatives for the proposed anesthesia with the patient or authorized representative who has indicated his/her understanding and acceptance.     Dental advisory given  Plan Discussed with: CRNA  Anesthesia Plan Comments: (See PAT note written 12/17/2023 by Allison Zelenak, PA-C.  **For anesthesia history, he has had post-operative urinary retention and post-operative delirium. His wife ia a Charity fundraiser, retired from [employer]. They are particularly concerned about over-treatment of hypertension could lead to worsening hypotension. He denied syncope, but has experienced positional dizziness and had some falls. They also request shortest acting medications as much as possible in hopes to minimize post-operative delirium.  His delirium was the worst after his endoscopy procedures in 2022. He did have some hallucinations after he was discharged home following 07/03/21 back surgery.**   He had preoperative evaluation by cardiologist Dr. Addie Holstein on  11/30/23. He notes moderate essential hypertension is "quite problematic and the fact that he has pretty dramatic dysautonomia with significant 50 mmHg drop in blood pressures from sitting to standing. Need to remove  AV nodal agents to allow for heart rate control since the heart rate did not move at all with dramatic drops in pressure. Allow for permissive hypertension." Parkinson's thought to be the cause of his autonomic instability or orthostatic hypotension.   In regards to upcoming surgery, Dr. Addie Holstein wrote: "Support decision to proceed with back surgery to improve mobility. - Advise discussing anesthesia options with anesthesiologist, considering Parkinson's and blood pressure issues."  "Inform surgical team to avoid over-treating supine hypertension. - Instruct to check blood pressure sitting or standing, not lying down. - Advise against treating supine hypertension unless necessary. - Recommend short-acting blood pressure medications during surgery if needed. - Educate surgical team to expect higher supine blood pressure and not to over-treat."  "Preop cardiovascular exam Mr. Gunderson perioperative risk of a major cardiac event is 0.9% according to the Revised Cardiac Risk Index (RCRI).  Therefore, he is at low risk for perioperative complications.   His functional capacity is fair at 5.84 METs according to the Duke Activity Status Index (DASI). Recommendations: According to ACC/AHA guidelines, no further cardiovascular testing needed.  The patient may proceed to surgery at acceptable risk.   Antiplatelet and/or Anticoagulation Recommendations: Not on any medications that need to be held No ischemic evaluation required. )       Anesthesia Quick Evaluation

## 2023-12-21 ENCOUNTER — Ambulatory Visit: Admitting: Audiology

## 2023-12-24 ENCOUNTER — Encounter (HOSPITAL_COMMUNITY): Payer: Self-pay

## 2023-12-24 ENCOUNTER — Other Ambulatory Visit: Payer: Self-pay

## 2023-12-24 ENCOUNTER — Other Ambulatory Visit (HOSPITAL_COMMUNITY): Payer: Self-pay

## 2023-12-25 ENCOUNTER — Other Ambulatory Visit (HOSPITAL_COMMUNITY): Payer: Self-pay

## 2023-12-27 ENCOUNTER — Other Ambulatory Visit: Payer: Self-pay

## 2023-12-27 ENCOUNTER — Other Ambulatory Visit (HOSPITAL_COMMUNITY): Payer: Self-pay

## 2023-12-29 ENCOUNTER — Other Ambulatory Visit (HOSPITAL_COMMUNITY): Payer: Self-pay

## 2023-12-31 ENCOUNTER — Inpatient Hospital Stay (HOSPITAL_COMMUNITY)

## 2023-12-31 ENCOUNTER — Inpatient Hospital Stay (HOSPITAL_COMMUNITY)
Admission: RE | Admit: 2023-12-31 | Discharge: 2024-01-12 | DRG: 426 | Disposition: A | Discharge status: Skilled Nursing Facility | Attending: Neurosurgery | Admitting: Neurosurgery

## 2023-12-31 ENCOUNTER — Inpatient Hospital Stay (HOSPITAL_COMMUNITY): Payer: Self-pay | Admitting: Vascular Surgery

## 2023-12-31 ENCOUNTER — Encounter (HOSPITAL_COMMUNITY): Payer: Self-pay | Admitting: Neurosurgery

## 2023-12-31 ENCOUNTER — Other Ambulatory Visit: Payer: Self-pay

## 2023-12-31 ENCOUNTER — Encounter (HOSPITAL_COMMUNITY)
Admission: RE | Disposition: A | Discharge status: Skilled Nursing Facility | Payer: Self-pay | Source: Ambulatory Visit | Attending: Neurosurgery

## 2023-12-31 DIAGNOSIS — I951 Orthostatic hypotension: Secondary | ICD-10-CM | POA: Diagnosis present

## 2023-12-31 DIAGNOSIS — B1920 Unspecified viral hepatitis C without hepatic coma: Secondary | ICD-10-CM | POA: Diagnosis present

## 2023-12-31 DIAGNOSIS — E440 Moderate protein-calorie malnutrition: Secondary | ICD-10-CM | POA: Insufficient documentation

## 2023-12-31 DIAGNOSIS — M4316 Spondylolisthesis, lumbar region: Secondary | ICD-10-CM

## 2023-12-31 DIAGNOSIS — Z751 Person awaiting admission to adequate facility elsewhere: Secondary | ICD-10-CM | POA: Diagnosis not present

## 2023-12-31 DIAGNOSIS — Z888 Allergy status to other drugs, medicaments and biological substances status: Secondary | ICD-10-CM

## 2023-12-31 DIAGNOSIS — M4726 Other spondylosis with radiculopathy, lumbar region: Secondary | ICD-10-CM | POA: Diagnosis not present

## 2023-12-31 DIAGNOSIS — F32A Depression, unspecified: Secondary | ICD-10-CM | POA: Diagnosis present

## 2023-12-31 DIAGNOSIS — M549 Dorsalgia, unspecified: Secondary | ICD-10-CM | POA: Diagnosis present

## 2023-12-31 DIAGNOSIS — R2689 Other abnormalities of gait and mobility: Secondary | ICD-10-CM | POA: Diagnosis not present

## 2023-12-31 DIAGNOSIS — I10 Essential (primary) hypertension: Secondary | ICD-10-CM

## 2023-12-31 DIAGNOSIS — R278 Other lack of coordination: Secondary | ICD-10-CM | POA: Diagnosis not present

## 2023-12-31 DIAGNOSIS — N400 Enlarged prostate without lower urinary tract symptoms: Secondary | ICD-10-CM | POA: Diagnosis present

## 2023-12-31 DIAGNOSIS — Z87891 Personal history of nicotine dependence: Secondary | ICD-10-CM | POA: Diagnosis not present

## 2023-12-31 DIAGNOSIS — G20C Parkinsonism, unspecified: Secondary | ICD-10-CM | POA: Diagnosis not present

## 2023-12-31 DIAGNOSIS — Z79899 Other long term (current) drug therapy: Secondary | ICD-10-CM

## 2023-12-31 DIAGNOSIS — M4800 Spinal stenosis, site unspecified: Secondary | ICD-10-CM | POA: Diagnosis present

## 2023-12-31 DIAGNOSIS — I739 Peripheral vascular disease, unspecified: Secondary | ICD-10-CM | POA: Diagnosis present

## 2023-12-31 DIAGNOSIS — G928 Other toxic encephalopathy: Secondary | ICD-10-CM | POA: Diagnosis not present

## 2023-12-31 DIAGNOSIS — M79605 Pain in left leg: Secondary | ICD-10-CM | POA: Diagnosis not present

## 2023-12-31 DIAGNOSIS — G4733 Obstructive sleep apnea (adult) (pediatric): Secondary | ICD-10-CM | POA: Diagnosis present

## 2023-12-31 DIAGNOSIS — K219 Gastro-esophageal reflux disease without esophagitis: Secondary | ICD-10-CM | POA: Diagnosis present

## 2023-12-31 DIAGNOSIS — M6281 Muscle weakness (generalized): Secondary | ICD-10-CM | POA: Diagnosis not present

## 2023-12-31 DIAGNOSIS — H409 Unspecified glaucoma: Secondary | ICD-10-CM | POA: Diagnosis not present

## 2023-12-31 DIAGNOSIS — Z981 Arthrodesis status: Secondary | ICD-10-CM | POA: Diagnosis not present

## 2023-12-31 DIAGNOSIS — R451 Restlessness and agitation: Secondary | ICD-10-CM | POA: Diagnosis present

## 2023-12-31 DIAGNOSIS — F05 Delirium due to known physiological condition: Secondary | ICD-10-CM | POA: Diagnosis not present

## 2023-12-31 DIAGNOSIS — E785 Hyperlipidemia, unspecified: Secondary | ICD-10-CM | POA: Diagnosis not present

## 2023-12-31 DIAGNOSIS — R410 Disorientation, unspecified: Secondary | ICD-10-CM | POA: Diagnosis not present

## 2023-12-31 DIAGNOSIS — Z8249 Family history of ischemic heart disease and other diseases of the circulatory system: Secondary | ICD-10-CM

## 2023-12-31 DIAGNOSIS — M48061 Spinal stenosis, lumbar region without neurogenic claudication: Secondary | ICD-10-CM | POA: Diagnosis not present

## 2023-12-31 DIAGNOSIS — G20A1 Parkinson's disease without dyskinesia, without mention of fluctuations: Secondary | ICD-10-CM | POA: Diagnosis not present

## 2023-12-31 DIAGNOSIS — F419 Anxiety disorder, unspecified: Secondary | ICD-10-CM | POA: Diagnosis present

## 2023-12-31 DIAGNOSIS — D62 Acute posthemorrhagic anemia: Secondary | ICD-10-CM | POA: Diagnosis not present

## 2023-12-31 DIAGNOSIS — Z886 Allergy status to analgesic agent status: Secondary | ICD-10-CM

## 2023-12-31 DIAGNOSIS — L7632 Postprocedural hematoma of skin and subcutaneous tissue following other procedure: Secondary | ICD-10-CM | POA: Diagnosis not present

## 2023-12-31 DIAGNOSIS — D72829 Elevated white blood cell count, unspecified: Secondary | ICD-10-CM | POA: Diagnosis not present

## 2023-12-31 DIAGNOSIS — Z884 Allergy status to anesthetic agent status: Secondary | ICD-10-CM

## 2023-12-31 DIAGNOSIS — Z01810 Encounter for preprocedural cardiovascular examination: Secondary | ICD-10-CM | POA: Diagnosis not present

## 2023-12-31 DIAGNOSIS — G903 Multi-system degeneration of the autonomic nervous system: Secondary | ICD-10-CM | POA: Diagnosis not present

## 2023-12-31 HISTORY — PX: LUMBAR PERCUTANEOUS PEDICLE SCREW 2 LEVEL: SHX5561

## 2023-12-31 HISTORY — PX: ANTERIOR LAT LUMBAR FUSION: SHX1168

## 2023-12-31 SURGERY — ANTERIOR LATERAL LUMBAR FUSION 2 LEVELS
Anesthesia: General | Site: Spine Lumbar

## 2023-12-31 MED ORDER — CARBIDOPA-LEVODOPA 25-100 MG PO TABS
1.5000 | ORAL_TABLET | Freq: Three times a day (TID) | ORAL | Status: DC
  Administered 2023-12-31 – 2024-01-12 (×33): 1.5 via ORAL
  Filled 2023-12-31 (×2): qty 1.5
  Filled 2023-12-31 (×2): qty 2
  Filled 2023-12-31: qty 1.5
  Filled 2023-12-31 (×14): qty 2
  Filled 2023-12-31: qty 1.5
  Filled 2023-12-31 (×2): qty 2
  Filled 2023-12-31: qty 1.5
  Filled 2023-12-31: qty 2
  Filled 2023-12-31: qty 1.5
  Filled 2023-12-31 (×2): qty 2
  Filled 2023-12-31: qty 1.5
  Filled 2023-12-31 (×4): qty 2
  Filled 2023-12-31: qty 1.5
  Filled 2023-12-31 (×5): qty 2

## 2023-12-31 MED ORDER — ATORVASTATIN CALCIUM 10 MG PO TABS
20.0000 mg | ORAL_TABLET | Freq: Every day | ORAL | Status: DC
  Administered 2024-01-01 – 2024-01-12 (×12): 20 mg via ORAL
  Filled 2023-12-31 (×13): qty 2

## 2023-12-31 MED ORDER — PROPOFOL 10 MG/ML IV BOLUS
INTRAVENOUS | Status: AC
  Filled 2023-12-31: qty 20

## 2023-12-31 MED ORDER — PANTOPRAZOLE SODIUM 40 MG PO TBEC
40.0000 mg | DELAYED_RELEASE_TABLET | Freq: Two times a day (BID) | ORAL | Status: DC
  Administered 2023-12-31 – 2024-01-12 (×22): 40 mg via ORAL
  Filled 2023-12-31 (×23): qty 1

## 2023-12-31 MED ORDER — PROPOFOL 1000 MG/100ML IV EMUL
INTRAVENOUS | Status: AC
  Filled 2023-12-31: qty 100

## 2023-12-31 MED ORDER — ACETAMINOPHEN 325 MG PO TABS
650.0000 mg | ORAL_TABLET | ORAL | Status: DC | PRN
  Administered 2023-12-31 – 2024-01-12 (×15): 650 mg via ORAL
  Filled 2023-12-31 (×15): qty 2

## 2023-12-31 MED ORDER — RIVASTIGMINE TARTRATE 1.5 MG PO CAPS
4.5000 mg | ORAL_CAPSULE | Freq: Two times a day (BID) | ORAL | Status: DC
  Administered 2023-12-31 – 2024-01-12 (×23): 4.5 mg via ORAL
  Filled 2023-12-31 (×26): qty 3

## 2023-12-31 MED ORDER — CHLORHEXIDINE GLUCONATE CLOTH 2 % EX PADS: 6.0000 | MEDICATED_PAD | Freq: Once | CUTANEOUS | Status: DC

## 2023-12-31 MED ORDER — SODIUM CHLORIDE 0.9 % IV SOLN: INTRAVENOUS | Status: DC | PRN

## 2023-12-31 MED ORDER — ONDANSETRON HCL 4 MG/2ML IJ SOLN
INTRAMUSCULAR | Status: AC
  Filled 2023-12-31: qty 2

## 2023-12-31 MED ORDER — ACETAMINOPHEN 10 MG/ML IV SOLN
INTRAVENOUS | Status: AC
  Filled 2023-12-31: qty 100

## 2023-12-31 MED ORDER — CHLORHEXIDINE GLUCONATE 0.12 % MT SOLN: 15.0000 mL | Freq: Once | OROMUCOSAL | Status: AC

## 2023-12-31 MED ORDER — LATANOPROST 0.005 % OP SOLN
1.0000 [drp] | Freq: Every day | OPHTHALMIC | Status: DC
  Administered 2023-12-31 – 2024-01-02 (×3): 1 [drp] via OPHTHALMIC
  Filled 2023-12-31: qty 2.5

## 2023-12-31 MED ORDER — ACETAMINOPHEN 650 MG RE SUPP: 650.0000 mg | RECTAL | Status: DC | PRN

## 2023-12-31 MED ORDER — FENTANYL CITRATE (PF) 100 MCG/2ML IJ SOLN
INTRAMUSCULAR | Status: AC
  Filled 2023-12-31: qty 2

## 2023-12-31 MED ORDER — PHENYLEPHRINE 80 MCG/ML (10ML) SYRINGE FOR IV PUSH (FOR BLOOD PRESSURE SUPPORT)
PREFILLED_SYRINGE | INTRAVENOUS | Status: AC
Start: 2023-12-31 — End: ?
  Filled 2023-12-31: qty 10

## 2023-12-31 MED ORDER — OXYCODONE HCL 5 MG PO TABS
5.0000 mg | ORAL_TABLET | ORAL | Status: DC | PRN
  Administered 2024-01-06: 5 mg via ORAL
  Filled 2023-12-31: qty 1

## 2023-12-31 MED ORDER — ORAL CARE MOUTH RINSE: 15.0000 mL | Freq: Once | OROMUCOSAL | Status: AC

## 2023-12-31 MED ORDER — ALFUZOSIN HCL ER 10 MG PO TB24
10.0000 mg | ORAL_TABLET | Freq: Every day | ORAL | Status: DC
  Administered 2024-01-01 – 2024-01-12 (×11): 10 mg via ORAL
  Filled 2023-12-31 (×12): qty 1

## 2023-12-31 MED ORDER — EPHEDRINE 5 MG/ML INJ
INTRAVENOUS | Status: AC
  Filled 2023-12-31: qty 5

## 2023-12-31 MED ORDER — EPHEDRINE SULFATE-NACL 50-0.9 MG/10ML-% IV SOSY
PREFILLED_SYRINGE | INTRAVENOUS | Status: DC | PRN
  Administered 2023-12-31 (×2): 5 mg via INTRAVENOUS
  Administered 2023-12-31: 2.5 mg via INTRAVENOUS

## 2023-12-31 MED ORDER — ROCURONIUM BROMIDE 10 MG/ML (PF) SYRINGE
PREFILLED_SYRINGE | INTRAVENOUS | Status: DC | PRN
  Administered 2023-12-31: 20 mg via INTRAVENOUS
  Administered 2023-12-31: 60 mg via INTRAVENOUS

## 2023-12-31 MED ORDER — DORZOLAMIDE HCL-TIMOLOL MAL 2-0.5 % OP SOLN
1.0000 [drp] | Freq: Two times a day (BID) | OPHTHALMIC | Status: DC
  Administered 2023-12-31 – 2024-01-12 (×23): 1 [drp] via OPHTHALMIC
  Filled 2023-12-31: qty 10

## 2023-12-31 MED ORDER — FENTANYL CITRATE (PF) 250 MCG/5ML IJ SOLN
INTRAMUSCULAR | Status: DC | PRN
  Administered 2023-12-31: 100 ug via INTRAVENOUS
  Administered 2023-12-31: 25 ug via INTRAVENOUS
  Administered 2023-12-31: 50 ug via INTRAVENOUS

## 2023-12-31 MED ORDER — CEFAZOLIN SODIUM-DEXTROSE 2-4 GM/100ML-% IV SOLN
INTRAVENOUS | Status: AC
  Filled 2023-12-31: qty 100

## 2023-12-31 MED ORDER — CHLORHEXIDINE GLUCONATE 0.12 % MT SOLN
OROMUCOSAL | Status: AC
  Administered 2023-12-31: 15 mL via OROMUCOSAL
  Filled 2023-12-31: qty 15

## 2023-12-31 MED ORDER — BISACODYL 10 MG RE SUPP: 10.0000 mg | Freq: Every day | RECTAL | Status: DC | PRN

## 2023-12-31 MED ORDER — PROPOFOL 10 MG/ML IV BOLUS
INTRAVENOUS | Status: DC | PRN
  Administered 2023-12-31: 100 mg via INTRAVENOUS

## 2023-12-31 MED ORDER — CEFAZOLIN SODIUM-DEXTROSE 2-4 GM/100ML-% IV SOLN
2.0000 g | Freq: Three times a day (TID) | INTRAVENOUS | Status: AC
  Administered 2023-12-31 – 2024-01-01 (×2): 2 g via INTRAVENOUS
  Filled 2023-12-31 (×3): qty 100

## 2023-12-31 MED ORDER — ACETAMINOPHEN 500 MG PO TABS
ORAL_TABLET | ORAL | Status: AC
  Filled 2023-12-31: qty 1

## 2023-12-31 MED ORDER — LACTATED RINGERS IV SOLN: INTRAVENOUS | Status: DC

## 2023-12-31 MED ORDER — ACETAMINOPHEN 10 MG/ML IV SOLN
1000.0000 mg | Freq: Once | INTRAVENOUS | Status: DC | PRN
Start: 2023-12-31 — End: 2023-12-31

## 2023-12-31 MED ORDER — THROMBIN 5000 UNITS EX KIT
PACK | CUTANEOUS | Status: AC
  Filled 2023-12-31: qty 1

## 2023-12-31 MED ORDER — OXYCODONE HCL 5 MG/5ML PO SOLN: 5.0000 mg | Freq: Once | ORAL | Status: DC | PRN

## 2023-12-31 MED ORDER — SODIUM CHLORIDE 0.9% FLUSH: 3.0000 mL | INTRAVENOUS | Status: DC | PRN

## 2023-12-31 MED ORDER — LIDOCAINE-EPINEPHRINE 1 %-1:100000 IJ SOLN
INTRAMUSCULAR | Status: DC | PRN
Start: 2023-12-31 — End: 2023-12-31
  Administered 2023-12-31: 16 mL

## 2023-12-31 MED ORDER — TRIHEXYPHENIDYL HCL 2 MG PO TABS
1.0000 mg | ORAL_TABLET | Freq: Two times a day (BID) | ORAL | Status: DC
  Administered 2023-12-31 – 2024-01-12 (×21): 1 mg via ORAL
  Filled 2023-12-31 (×26): qty 1

## 2023-12-31 MED ORDER — MORPHINE SULFATE (PF) 2 MG/ML IV SOLN
2.0000 mg | INTRAVENOUS | Status: DC | PRN
  Administered 2023-12-31 – 2024-01-04 (×4): 2 mg via INTRAVENOUS
  Filled 2023-12-31 (×4): qty 1

## 2023-12-31 MED ORDER — PHENYLEPHRINE HCL-NACL 20-0.9 MG/250ML-% IV SOLN
INTRAVENOUS | Status: DC | PRN
  Administered 2023-12-31: 30 ug/min via INTRAVENOUS

## 2023-12-31 MED ORDER — OXYCODONE HCL 5 MG PO TABS: 5.0000 mg | ORAL_TABLET | Freq: Once | ORAL | Status: DC | PRN

## 2023-12-31 MED ORDER — CEFAZOLIN SODIUM-DEXTROSE 2-4 GM/100ML-% IV SOLN
2.0000 g | INTRAVENOUS | Status: AC
  Administered 2023-12-31: 2 g via INTRAVENOUS

## 2023-12-31 MED ORDER — MENTHOL 3 MG MT LOZG: 1.0000 | LOZENGE | OROMUCOSAL | Status: DC | PRN

## 2023-12-31 MED ORDER — SUGAMMADEX SODIUM 200 MG/2ML IV SOLN
INTRAVENOUS | Status: DC | PRN
  Administered 2023-12-31: 100 mg via INTRAVENOUS

## 2023-12-31 MED ORDER — LIDOCAINE-EPINEPHRINE 1 %-1:100000 IJ SOLN
INTRAMUSCULAR | Status: AC
  Filled 2023-12-31: qty 1

## 2023-12-31 MED ORDER — SODIUM CHLORIDE 0.9% FLUSH
3.0000 mL | Freq: Two times a day (BID) | INTRAVENOUS | Status: DC
  Administered 2023-12-31 – 2024-01-12 (×24): 3 mL via INTRAVENOUS

## 2023-12-31 MED ORDER — DOCUSATE SODIUM 100 MG PO CAPS
100.0000 mg | ORAL_CAPSULE | Freq: Two times a day (BID) | ORAL | Status: DC
  Administered 2023-12-31 – 2024-01-11 (×17): 100 mg via ORAL
  Filled 2023-12-31 (×22): qty 1

## 2023-12-31 MED ORDER — DEXAMETHASONE SODIUM PHOSPHATE 10 MG/ML IJ SOLN
INTRAMUSCULAR | Status: DC | PRN
  Administered 2023-12-31: 5 mg via INTRAVENOUS

## 2023-12-31 MED ORDER — SODIUM CHLORIDE 0.9 % IV SOLN
0.1500 ug/kg/min | Freq: Once | INTRAVENOUS | Status: AC
  Administered 2023-12-31: .05 ug/kg/min via INTRAVENOUS
  Filled 2023-12-31: qty 2000

## 2023-12-31 MED ORDER — ONDANSETRON HCL 4 MG PO TABS: 4.0000 mg | ORAL_TABLET | Freq: Four times a day (QID) | ORAL | Status: DC | PRN

## 2023-12-31 MED ORDER — OXYCODONE HCL 5 MG PO TABS
10.0000 mg | ORAL_TABLET | ORAL | Status: DC | PRN
  Administered 2024-01-01 – 2024-01-07 (×4): 10 mg via ORAL
  Filled 2023-12-31 (×4): qty 2

## 2023-12-31 MED ORDER — PROPOFOL 500 MG/50ML IV EMUL
INTRAVENOUS | Status: DC | PRN
  Administered 2023-12-31: 150 ug/kg/min via INTRAVENOUS

## 2023-12-31 MED ORDER — VERAPAMIL HCL ER 120 MG PO TBCR: 120.0000 mg | EXTENDED_RELEASE_TABLET | Freq: Every morning | ORAL | Status: DC

## 2023-12-31 MED ORDER — ONDANSETRON HCL 4 MG/2ML IJ SOLN: 4.0000 mg | Freq: Four times a day (QID) | INTRAMUSCULAR | Status: DC | PRN

## 2023-12-31 MED ORDER — FENTANYL CITRATE (PF) 250 MCG/5ML IJ SOLN
INTRAMUSCULAR | Status: AC
  Filled 2023-12-31: qty 5

## 2023-12-31 MED ORDER — IRBESARTAN 150 MG PO TABS
75.0000 mg | ORAL_TABLET | Freq: Every morning | ORAL | Status: DC
  Administered 2024-01-02 – 2024-01-12 (×10): 75 mg via ORAL
  Filled 2023-12-31 (×12): qty 1

## 2023-12-31 MED ORDER — THROMBIN 20000 UNITS EX SOLR
CUTANEOUS | Status: AC
  Filled 2023-12-31: qty 20000

## 2023-12-31 MED ORDER — ONDANSETRON HCL 4 MG/2ML IJ SOLN
INTRAMUSCULAR | Status: DC | PRN
  Administered 2023-12-31: 4 mg via INTRAVENOUS

## 2023-12-31 MED ORDER — QUETIAPINE FUMARATE 50 MG PO TABS
25.0000 mg | ORAL_TABLET | Freq: Every day | ORAL | Status: DC
  Administered 2023-12-31: 25 mg via ORAL
  Filled 2023-12-31: qty 1

## 2023-12-31 MED ORDER — BUPIVACAINE HCL (PF) 0.5 % IJ SOLN
INTRAMUSCULAR | Status: DC | PRN
  Administered 2023-12-31: 16 mL

## 2023-12-31 MED ORDER — CITALOPRAM HYDROBROMIDE 10 MG PO TABS
20.0000 mg | ORAL_TABLET | Freq: Every day | ORAL | Status: DC
  Administered 2023-12-31 – 2024-01-11 (×11): 20 mg via ORAL
  Filled 2023-12-31: qty 1
  Filled 2023-12-31 (×3): qty 2
  Filled 2023-12-31: qty 1
  Filled 2023-12-31 (×7): qty 2

## 2023-12-31 MED ORDER — SENNA 8.6 MG PO TABS
1.0000 | ORAL_TABLET | Freq: Two times a day (BID) | ORAL | Status: DC
  Administered 2023-12-31 – 2024-01-10 (×15): 8.6 mg via ORAL
  Filled 2023-12-31 (×20): qty 1

## 2023-12-31 MED ORDER — ACETAMINOPHEN 10 MG/ML IV SOLN
INTRAVENOUS | Status: DC | PRN
  Administered 2023-12-31: 1000 mg via INTRAVENOUS

## 2023-12-31 MED ORDER — SODIUM CHLORIDE 0.9 % IV SOLN
250.0000 mL | INTRAVENOUS | Status: AC
  Administered 2023-12-31: 250 mL via INTRAVENOUS

## 2023-12-31 MED ORDER — ADULT MULTIVITAMIN W/MINERALS CH
1.0000 | ORAL_TABLET | Freq: Every day | ORAL | Status: DC
  Administered 2023-12-31 – 2024-01-12 (×13): 1 via ORAL
  Filled 2023-12-31 (×13): qty 1

## 2023-12-31 MED ORDER — NITROGLYCERIN 0.4 MG SL SUBL: 0.4000 mg | SUBLINGUAL_TABLET | SUBLINGUAL | Status: DC | PRN

## 2023-12-31 MED ORDER — METHOCARBAMOL 500 MG PO TABS
500.0000 mg | ORAL_TABLET | Freq: Four times a day (QID) | ORAL | Status: DC | PRN
  Administered 2023-12-31 – 2024-01-07 (×5): 500 mg via ORAL
  Filled 2023-12-31 (×6): qty 1

## 2023-12-31 MED ORDER — METHOCARBAMOL 1000 MG/10ML IJ SOLN: 500.0000 mg | Freq: Four times a day (QID) | INTRAMUSCULAR | Status: DC | PRN

## 2023-12-31 MED ORDER — DEXAMETHASONE SODIUM PHOSPHATE 10 MG/ML IJ SOLN
INTRAMUSCULAR | Status: AC
  Filled 2023-12-31: qty 1

## 2023-12-31 MED ORDER — PHENOL 1.4 % MT LIQD: 1.0000 | OROMUCOSAL | Status: DC | PRN

## 2023-12-31 MED ORDER — BUPIVACAINE HCL (PF) 0.5 % IJ SOLN
INTRAMUSCULAR | Status: AC
Start: 2023-12-31 — End: ?
  Filled 2023-12-31: qty 30

## 2023-12-31 MED ORDER — LIDOCAINE 2% (20 MG/ML) 5 ML SYRINGE
INTRAMUSCULAR | Status: AC
  Filled 2023-12-31: qty 5

## 2023-12-31 MED ORDER — 0.9 % SODIUM CHLORIDE (POUR BTL) OPTIME
TOPICAL | Status: DC | PRN
Start: 2023-12-31 — End: 2023-12-31
  Administered 2023-12-31: 1000 mL

## 2023-12-31 MED ORDER — NETARSUDIL-LATANOPROST 0.02-0.005 % OP SOLN: 1.0000 [drp] | Freq: Every day | OPHTHALMIC | Status: DC

## 2023-12-31 MED ORDER — ROCURONIUM BROMIDE 10 MG/ML (PF) SYRINGE
PREFILLED_SYRINGE | INTRAVENOUS | Status: AC
  Filled 2023-12-31: qty 10

## 2023-12-31 MED ORDER — THROMBIN 5000 UNITS EX SOLR
OROMUCOSAL | Status: DC | PRN
  Administered 2023-12-31: 5 mL via TOPICAL

## 2023-12-31 MED ORDER — ACETAMINOPHEN 500 MG PO TABS: 1000.0000 mg | ORAL_TABLET | Freq: Once | ORAL | Status: DC

## 2023-12-31 MED ORDER — FENTANYL CITRATE (PF) 100 MCG/2ML IJ SOLN
25.0000 ug | INTRAMUSCULAR | Status: DC | PRN
  Administered 2023-12-31: 25 ug via INTRAVENOUS
  Administered 2023-12-31: 50 ug via INTRAVENOUS
  Administered 2023-12-31: 25 ug via INTRAVENOUS
  Administered 2023-12-31: 50 ug via INTRAVENOUS

## 2023-12-31 SURGICAL SUPPLY — 95 items
ALLOGRAFT BONE FIBER KORE 5 (Bone Implant) IMPLANT
BAG COUNTER SPONGE SURGICOUNT (BAG) ×4 IMPLANT
BAND RUBBER #18 3X1/16 STRL (MISCELLANEOUS) IMPLANT
BENZOIN TINCTURE PRP APPL 2/3 (GAUZE/BANDAGES/DRESSINGS) IMPLANT
BLADE CLIPPER SURG (BLADE) IMPLANT
BLADE SURG 11 STRL SS (BLADE) ×2 IMPLANT
BUR MATCHSTICK NEURO 3.0 LAGG (BURR) ×2 IMPLANT
BUR MR8 14 SP MATCH 3 (BUR) IMPLANT
BUR PRECISION FLUTE 5.0 (BURR) ×2 IMPLANT
CANISTER SUCTION 3000ML PPV (SUCTIONS) ×2 IMPLANT
CAP PUSHER PTP (ORTHOPEDIC DISPOSABLE SUPPLIES) IMPLANT
CATH ROBINSON RED A/P 12FR (CATHETERS) IMPLANT
CLIP SPRING STIM LLIF SAFEOP (CLIP) IMPLANT
CNTNR URN SCR LID CUP LEK RST (MISCELLANEOUS) ×2 IMPLANT
COVER BACK TABLE 60X90IN (DRAPES) ×2 IMPLANT
COVERAGE SUPPORT O-ARM STEALTH (MISCELLANEOUS) ×2 IMPLANT
DERMABOND ADVANCED .7 DNX12 (GAUZE/BANDAGES/DRESSINGS) ×4 IMPLANT
DILATOR INSULATED LLIF 8-13-18 (NEUROSURGERY SUPPLIES) IMPLANT
DISSECTOR BLUNT TIP ENDO 5MM (MISCELLANEOUS) ×2 IMPLANT
DRAPE C-ARM 42X72 X-RAY (DRAPES) ×4 IMPLANT
DRAPE C-ARMOR (DRAPES) ×4 IMPLANT
DRAPE LAPAROTOMY 100X72X124 (DRAPES) ×4 IMPLANT
DRAPE MICROSCOPE LEICA (MISCELLANEOUS) IMPLANT
DRAPE SHEET LG 3/4 BI-LAMINATE (DRAPES) ×8 IMPLANT
DRAPE SURG 17X23 STRL (DRAPES) ×2 IMPLANT
DRSG OPSITE POSTOP 4X6 (GAUZE/BANDAGES/DRESSINGS) IMPLANT
DURAPREP 26ML APPLICATOR (WOUND CARE) ×4 IMPLANT
ELECT BLADE 6.5 EXT (BLADE) IMPLANT
ELECTRODE KT SAFEOP SSEP/SURF (KITS) IMPLANT
ELECTRODE REM PT RTRN 9FT ADLT (ELECTROSURGICAL) ×4 IMPLANT
EXTENDER BLADE LIF PTP 26 (INSTRUMENTS) IMPLANT
FEE COVERAGE SUPPORT O-ARM (MISCELLANEOUS) ×2 IMPLANT
GAUZE 4X4 16PLY ~~LOC~~+RFID DBL (SPONGE) IMPLANT
GAUZE SPONGE 4X4 12PLY STRL (GAUZE/BANDAGES/DRESSINGS) IMPLANT
GLOVE BIO SURGEON STRL SZ7 (GLOVE) IMPLANT
GLOVE BIOGEL PI IND STRL 7.0 (GLOVE) IMPLANT
GLOVE BIOGEL PI IND STRL 7.5 (GLOVE) ×6 IMPLANT
GLOVE ECLIPSE 7.0 STRL STRAW (GLOVE) ×4 IMPLANT
GLOVE EXAM NITRILE XL STR (GLOVE) IMPLANT
GLOVE SURG SS PI 6.5 STRL IVOR (GLOVE) IMPLANT
GOWN STRL REUS W/ TWL LRG LVL3 (GOWN DISPOSABLE) ×4 IMPLANT
GOWN STRL REUS W/ TWL XL LVL3 (GOWN DISPOSABLE) ×4 IMPLANT
GOWN STRL REUS W/TWL 2XL LVL3 (GOWN DISPOSABLE) IMPLANT
GUARD EXPOSURE LIF PTP 11 (INSTRUMENTS) IMPLANT
GUIDEWIRE LLIF TT 310 (WIRE) IMPLANT
HEMOSTAT POWDER KIT SURGIFOAM (HEMOSTASIS) ×2 IMPLANT
KIT BASIN OR (CUSTOM PROCEDURE TRAY) ×4 IMPLANT
KIT INFUSE SMALL (Orthopedic Implant) IMPLANT
KIT POSITIONER JACKSON TABLE (MISCELLANEOUS) ×2 IMPLANT
KIT TURNOVER KIT B (KITS) ×4 IMPLANT
KNIFE ANNULOTOMY SINGLE STL (ORTHOPEDIC DISPOSABLE SUPPLIES) IMPLANT
MARKER SPHERE PSV REFLC NDI (MISCELLANEOUS) ×10 IMPLANT
MODULE POSITIONING LIF 2.0 (INSTRUMENTS) IMPLANT
NDL HYPO 18GX1.5 BLUNT FILL (NEEDLE) IMPLANT
NDL HYPO 22X1.5 SAFETY MO (MISCELLANEOUS) ×2 IMPLANT
NDL HYPO 25X1 1.5 SAFETY (NEEDLE) ×2 IMPLANT
NDL SPNL 18GX3.5 QUINCKE PK (NEEDLE) ×2 IMPLANT
NEEDLE HYPO 18GX1.5 BLUNT FILL (NEEDLE) IMPLANT
NEEDLE HYPO 22X1.5 SAFETY MO (MISCELLANEOUS) ×2 IMPLANT
NEEDLE HYPO 25X1 1.5 SAFETY (NEEDLE) ×2 IMPLANT
NEEDLE SPNL 18GX3.5 QUINCKE PK (NEEDLE) ×2 IMPLANT
NS IRRIG 1000ML POUR BTL (IV SOLUTION) ×4 IMPLANT
PACK LAMINECTOMY NEURO (CUSTOM PROCEDURE TRAY) ×4 IMPLANT
PAD ARMBOARD POSITIONER FOAM (MISCELLANEOUS) ×6 IMPLANT
PATTIES SURGICAL .5 X.5 (GAUZE/BANDAGES/DRESSINGS) ×6 IMPLANT
PATTIES SURGICAL 1X1 (DISPOSABLE) ×6 IMPLANT
PIN BONE FIX 100 (PIN) IMPLANT
PROBE BALL TIP LLIF SAFEOP (NEUROSURGERY SUPPLIES) IMPLANT
ROD LORD MIS TI 5.5X70 (Rod) IMPLANT
ROD LORD MIS TI 5.5X75 (Rod) IMPLANT
SCREW CANN FENESTRA 5.5X50 (Screw) IMPLANT
SCREW CANN FENESTRA 6.5X50 (Screw) IMPLANT
SET SCREW SPNE (Screw) IMPLANT
SHIM INTRADISCAL PTP WIDE (ORTHOPEDIC DISPOSABLE SUPPLIES) IMPLANT
SPACER LTX 6X18X45 15D (Spacer) IMPLANT
SPACER SPIN CALI 6X22X45 15D (Spacer) IMPLANT
SPIKE FLUID TRANSFER (MISCELLANEOUS) ×4 IMPLANT
SPONGE SURGIFOAM ABS GEL 100 (HEMOSTASIS) ×2 IMPLANT
SPONGE SURGIFOAM ABS GEL SZ50 (HEMOSTASIS) IMPLANT
SPONGE T-LAP 4X18 ~~LOC~~+RFID (SPONGE) IMPLANT
SPONGE TONSIL 1 RF SGL (DISPOSABLE) IMPLANT
STAPLER SKIN PROX 35W (STAPLE) ×2 IMPLANT
STRIP CLOSURE SKIN 1/2X4 (GAUZE/BANDAGES/DRESSINGS) IMPLANT
SUT VIC AB 0 CT1 18XCR BRD8 (SUTURE) ×4 IMPLANT
SUT VIC AB 2-0 CT1 18 (SUTURE) ×2 IMPLANT
SUT VIC AB 3-0 SH 8-18 (SUTURE) ×4 IMPLANT
SUT VICRYL 3-0 RB1 18 ABS (SUTURE) ×2 IMPLANT
SYR 30ML SLIP (SYRINGE) ×6 IMPLANT
SYR 3ML LL SCALE MARK (SYRINGE) IMPLANT
SYS LTP ILLUMINATOR SIGMA ATEC (ORTHOPEDIC DISPOSABLE SUPPLIES) IMPLANT
TOWEL GREEN STERILE (TOWEL DISPOSABLE) ×4 IMPLANT
TOWEL GREEN STERILE FF (TOWEL DISPOSABLE) ×4 IMPLANT
TRAP SPECIMEN MUCUS 40CC (MISCELLANEOUS) ×2 IMPLANT
TRAY FOLEY MTR SLVR 16FR STAT (SET/KITS/TRAYS/PACK) ×4 IMPLANT
WATER STERILE IRR 1000ML POUR (IV SOLUTION) ×4 IMPLANT

## 2023-12-31 NOTE — Op Note (Signed)
 NEUROSURGERY OPERATIVE NOTE   PREOP DIAGNOSIS:  Lumbar spondylosis with radiculopathy, L3-4, L4-5 Spondylolisthesis, L4-5   POSTOP DIAGNOSIS: Same  PROCEDURE STAGE 1: Prone trans-psoas approach for lateral interbody fusion, L3-4, L4-5 - Alphatec expandable lordotic cages x2 Use of morcellized bone allograft - DBF, BMP Intraoperative electrophysiologic monitoring  PROCEDURE STAGE 2: Right L4-5 radical facetectomy for decompression of nerve root Posterior segmental instrumentation, L3-L5 - Alphatec screws/rod 5.5 and 6.5 x 50mm screws, lordotic rod 3.   Use of intraoperative stereotaxy  SURGEON: Dr. Augusto Blonder, MD  ASSISTANT: Easter Golden, PA-C  ANESTHESIA: General Endotracheal  EBL: 200cc  SPECIMENS: None  DRAINS: None  COMPLICATIONS: None immediate  CONDITION: Hemodynamically stable to PACU  HISTORY: [name] is a 73 y.o. male who has been followed in the outpatient neurosurgery clinic with a previous lumbar decompression and recurrence of back and leg pain. He attempted multiple conservative treatments without lasting improvement and ultimately elected to proceed with surgical decompression and fusion.  Risks, benefits, and alternatives to surgery were all reviewed in detail with the patient and his family. After all questions were answered informed consent was obtained and witnessed.  PROCEDURE IN DETAIL: The patient was brought to the operating room. After induction of general anesthesia, the patient was positioned on the operative table in the pront position. All pressure points were meticulously padded. Skin of the low back and the right flank were prepped and draped in standard sterile fashion.   After timeout was conducted, a stab incision was made over the right posterior superior iliac spine.  The stereotactic array was then anchored into a Schanz pin into the PSIS.  Intraoperative CT scan was then taken.  Excellent accuracy was confirmed.  I then  utilized the navigation probe to mark out the surface projection of standard trajectory pedicle screws from L3-L5 bilaterally.  The skin incisions were then made sharply after infiltration with local anesthetic.  Bovie was used to dissect through the subcutaneous tissue and the lumbodorsal fascia was identified and incised.  Utilizing the stereotactic drill, pilot holes were drilled for pedicle screws bilaterally from L3-L5.  Pedicle screws were then placed measuring 5.5 x 50 mm at L3 and L4 bilaterally, and 6.5 x 50 mm at L5.  AP and lateral fluoroscopic images were taken to confirm good pedicle screw location.  At this point attention was turned to the [facility] psoas lateral interbody portion of the procedure.  Again utilizing the navigation probe, I identified a trajectory through the psoas muscle to the disc space at L3-4 and L4-5.  An oblique incision over the left flank was then infiltrated with local anesthetic with epinephrine .  Incision was then made sharply.  Bovie was used to dissect through the subcutaneous tissue.  The oblique musculature was then bluntly dissected.  The transversalis fascia was then opened bluntly and the retroperitoneal space was dissected bluntly.  I then utilized the small I later and stereotactic and live fluoroscopic guidance to docked the dilator on the L4-5 disc space.  The K wire was then advanced into the disc space.  Sequential dilators were then placed through the psoas muscle under electrophysiologic monitoring.  We did appear to identify the lumbar plexus posteriorly.  The retractor was then placed.  Under direct vision, I stimulated all quadrants of the exposure, without stimulation of the lumbar plexus at a threshold greater than 20 mA in all quadrants.  At this point the shim was placed into L4-5.  The retractor was expanded.  The  space was then incised and using a combination of curettes and rongeurs, the discectomy was completed.  Plates were then prepared with a rasp.   Trial expandable cage was then utilized to select a 45 mm x 18 mm mandible cage.  This was filled with BMP and DBM.  This was then tapped into place utilizing both stereotaxy and live AP and lateral fluoroscopy.  The cage was then maximally expanded.  We noted excellent reduction of both the coronal and sagittal deformity.  The disc base and cage was then backfilled with bone graft.  Once position was checked with fluoroscopy, the shim was removed.  The retractor was close down and removed under direct vision without any active bleeding identified.  At this point attention was turned to the L3-4 level.  In a similar fashion, utilizing stereotaxy and AP and lateral fluoroscopy, the initial dilator was docked at the L3-4 interspace.  K wire was then advanced and sequential dilators were placed through the psoas muscle under live electrophysiologic monitoring.  Again, once the retractor was placed, the field was stimulated directly again with a stimulation threshold greater than 20 mA in all quadrants.  Chin was placed posteriorly and the retractor was expanded.  The space was then incised and discectomy was completed again with a combination of curettes and rongeur's.  Another trial was used to select a 22 x 45 mm cage.  This was again packed with morselized DBM and BMP.  Endplates were prepared with curettes and the cage was then tapped into place under live fluoroscopy.  Cage was then expanded maximally.  Position was checked again with AP and lateral fluoroscopy.  Cage was then backfilled.  The spondylolisthesis at L3-4 was also noted to be nearly completely reduced.  Retractor was then closed and the shim was removed.  Retractor was then removed under direct vision again with no active bleeding identified.  The left flank incision was then closed in multiple layers in standard fashion using a combination of interrupted 0 Vicryl stitches.  At this point attention was turned to the facetectomy on the right at  L4-5.  A Metrix tube was docked on the L4-5 facet complex.  Position was confirmed with fluoroscopy.  Under the microscope, a complete facetectomy was completed.  I identified the lateral edge of the thecal sac.  The intertransverse ligament was removed.  This allowed complete decompression of the foramen including the exiting right L4 nerve root.  Hemostasis was then easily secured using a combination of bipolar electrocautery and morselized Gelfoam with thrombin .  The Metrix tube was then removed.  At this point a 70 mm pre-bent lordotic rod was placed through the pedicle screw reduction towers on the right side and a 75 mm rod was placed on the left.  Setscrews were then placed and final tightened.  Reduction towers were then removed.  Final AP and lateral fluoroscopic images were taken indicating excellent placement of the implanted hardware and reduction of the spondylolisthesis and coronal deformity at both L3-4 and L4-5.  At this point the wounds were closed in standard fashion using a combination of interrupted 0 and 2-0 Vicryl stitches.  The Schanz pin was removed from the right sided PSIS and the stab incision closed with a single stitch.  Skin incisions were then closed with staples.  Bacitracin ointment and sterile dressing was applied.  At the end of the case all sponge, needle, instrument, and cottonoid counts were correct.  The patient was then transferred to the  stretcher and extubated.  He was taken to the postanesthesia care unit in stable hemodynamic condition.    Augusto Blonder, MD [facility]

## 2023-12-31 NOTE — Progress Notes (Signed)
 Orthopedic Tech Progress Note Patient Details:  [name] 1951/05/17 [id number]  Ortho Devices Type of Ortho Device: Lumbar corsett Ortho Device/Splint Location: Back Ortho Device/Splint Interventions: Ordered      [name] 12/31/2023, 4:27 PM

## 2023-12-31 NOTE — Plan of Care (Signed)

## 2023-12-31 NOTE — H&P (Signed)
 Chief Complaint   Back and Leg pain  History of Present Illness  [name] is a 73 year old man I am seeing in follow-up. To review, he has a previous history of lumbar decompression by Dr. Vaughn Georges. He has had recurrence of both back pain and right greater than left leg pain. He has previously tried physical therapy and injections which have not provided lasting improvement. At our last visit I did order a follow-up MRI of the lumbar spine which has been completed. He is reporting slow but progressive worsening in symptoms over the last few months. His wife says he spends most of his day in a recliner or in bed due to the pain.   To view, the patient does have a diagnosis of Parkinson&#39;s disease. He also has a history of hypertension. No history of diabetes. No previous heart attack or stroke. No cancer history. He does not report any known lung, liver, kidney disease. He takes a baby aspirin . He is a nonsmoker.   Past Medical History   Past Medical History:  Diagnosis Date   Allergy    seasonal   Anxiety    Arthritis    BPH (benign prostatic hyperplasia)    Complication of anesthesia    Pt. stated he had a reaction that ended in him requiring urinary cath placement; hx delirium worsening parkinson symptoms   Depression    Diverticulosis    GERD (gastroesophageal reflux disease)    esophageal spasms   Glaucoma    Gout    Head injury, closed, with concussion    Hepatitis C    chronic - Has been treated with Harvoni   HLD (hyperlipidemia)    statin intolerant (Crestor  & Simvastatin ) - Taking Livalo  1mg  / week   Hypertension    Neuromuscular disorder (HCC)    Parkinson's Disease   Parkinson's disease (HCC)    Plantar fasciitis    right   PVD (peripheral vascular disease) (HCC)    With no claudication; only mild abdominal aortic atherosclerosis noted on ultrasound.   Sleep apnea    Wears CPAP nightly   Spinal stenosis of lumbar region    Thoracic ascending aortic aneurysm  (HCC)    4.2 cm ascending TAA 09/2016 CT, 1 yr f/u rec   Ulcer     Past Surgical History   Past Surgical History:  Procedure Laterality Date   BUNIONECTOMY WITH WEIL OSTEOTOMY Right 11/02/2019   Procedure: Right Foot Lapidus, Modified McBride Bunionectomy,  Hallux Akin Osteotomy;  Surgeon: Amada Backer, MD;  Location: [facility];  Service: Orthopedics;  Laterality: Right;   CARDIAC CATHETERIZATION  2005   30% Cx. Dr. Ebbie Goldmann   cataract surgery Left 11/05/2014   COLONOSCOPY     COLONOSCOPY N/A 01/09/2021   Procedure: COLONOSCOPY;  Surgeon: Genell Ken, MD;  Location: [facility];  Service: Gastroenterology;  Laterality: N/A;   ESOPHAGOGASTRODUODENOSCOPY N/A 05/02/2015   Procedure: ESOPHAGOGASTRODUODENOSCOPY (EGD);  Surgeon: Lanita Pitman, MD;  Location: [facility];  Service: Endoscopy;  Laterality: N/A;   ESOPHAGOGASTRODUODENOSCOPY  05/02/2015   no source of pt chest pain endoscopically evident. small hiatal hernia.   ESOPHAGOGASTRODUODENOSCOPY (EGD) WITH PROPOFOL  N/A 01/02/2021   Procedure: ESOPHAGOGASTRODUODENOSCOPY (EGD) WITH PROPOFOL ;  Surgeon: [name] Blunt, MD;  Location: [facility];  Service: Endoscopy;  Laterality: N/A;   HARDWARE REMOVAL Right 11/02/2019   Procedure: Second Metatarsal Removal of Deep Implant and Rotational Osteotomy;  Surgeon: Amada Backer, MD;  Location: [facility];  Service: Orthopedics;  Laterality: Right;  IR ANGIOGRAM SELECTIVE EACH ADDITIONAL VESSEL  01/04/2021   IR ANGIOGRAM SELECTIVE EACH ADDITIONAL VESSEL  01/04/2021   IR ANGIOGRAM VISCERAL SELECTIVE  01/04/2021   IR US  GUIDE VASC ACCESS RIGHT  01/04/2021   LUMBAR LAMINECTOMY/DECOMPRESSION MICRODISCECTOMY N/A 07/03/2021   Procedure: Lumbar three through five decompression with lumbar three through five insitu fusion;  Surgeon: Mort Ards, MD;  Location: [facility] OR;  Service: Orthopedics;  Laterality: N/A;   MEMBRANE PEEL Left 03/14/2014   Procedure: MEMBRANE PEEL;  ENDOLASER;  Surgeon: Shon Downing, MD;  Location: [facility] OR;  Service: Ophthalmology;  Laterality: Left;   NM MYOVIEW  LTD  10/2015   LOW RISK. Small, fixed basal lateral defect - likely diaphragmatic attenuation. 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  No R WMA.  GRII DD-moderately dilated LA..  Mildly dilated RV but normal function.  Normal RAP/CVP.  Trivial AI with mild to moderate sclerosis-no stenosis   UMBILICAL HERNIA REPAIR  2023   UPPER GASTROINTESTINAL ENDOSCOPY     WEIL OSTEOTOMY Right 09/01/2017   Procedure: RIGHT GREAT TOE CHEVRON AND WEIL OSTEOTOMY 2ND METATARSAL;  Surgeon: Timothy Ford, MD;  Location: [facility] OR;  Service: Orthopedics;  Laterality: Right;   XI ROBOTIC ASSISTED VENTRAL HERNIA N/A 03/17/2022   Procedure: XI ROBOTIC ASSISTED VENTRAL HERNIA;  Surgeon: Emmalene Hare, MD;  Location: [facility] ORS;  Service: General;  Laterality: N/A;    Social History   Social History   Tobacco Use   Smoking status: Former    Types: Cigars    Quit date: 07/27/1988    Years since quitting: 35.4   Smokeless tobacco: Never  Vaping Use   Vaping status: Never Used  Substance Use Topics   Alcohol use: Not Currently    Alcohol/week: 3.0 standard drinks of alcohol    Types: 1 Glasses of wine, 1 Cans of beer, 1 Shots of liquor per week   Drug use: Not Currently    Types: Codeine     Medications   Prior to Admission medications   Medication Sig Start Date End Date Taking? Authorizing Provider  acetaminophen  (TYLENOL ) 500 MG tablet Take 2 tablets (1,000 mg total) by mouth every 6 (six) hours as needed for mild pain. 03/17/22  Yes Piscoya, Volanda Gruber, MD  acetaminophen -codeine  (TYLENOL  #3) 300-30 MG tablet Take 0.5 tablets by mouth  every 8 (eight) hours as needed for moderate pain (pain score 4-6).   Yes [provider]  alfuzosin  (UROXATRAL ) 10 MG 24 hr tablet Take 1 tablet (10 mg total) by mouth daily. 07/29/23  Yes   atorvastatin  (LIPITOR) 20 MG tablet Take 1 tablet (20 mg total) by mouth daily. 05/10/23 05/09/24 Yes Watson Hacking, MD  CALCIUM -VITAMIN D PO Take 1 tablet by mouth daily.   Yes [provider]  carbidopa -levodopa  (SINEMET  IR) 25-100 MG tablet Take 1.5 tablets by mouth 3 (three) times daily. 10/14/23  Yes McCue, Camilo Cella, NP  citalopram  (CELEXA ) 20 MG tablet Take 1 tablet (20 mg total) by mouth at bedtime. 09/08/23  Yes Watson Hacking, MD  dorzolamide -timolol  (COSOPT ) 22.3-6.8 MG/ML ophthalmic solution Instill 1 drop into both eyes twice a day 10/08/21  Yes   FIBER ADULT GUMMIES PO Take 1  tablet by mouth 2 (two) times daily.   Yes [provider]  ibuprofen  (ADVIL ) 600 MG tablet Take 1 tablet (600 mg total) by mouth 3 (three) times daily as needed. 12/07/23  Yes Carylon Claude, FNP  irbesartan  (AVAPRO ) 75 MG tablet Take 1 tablet (75 mg total) by mouth every morning. 11/29/23  Yes Watson Hacking, MD  Multiple Vitamins-Minerals (MULTIVITAMIN ADULTS 50+ PO) Take 1 tablet by mouth daily.   Yes [provider]  Netarsudil -Latanoprost  (ROCKLATAN ) 0.02-0.005 % SOLN Place 1 drop into the left eye every night at bedtime. 09/17/23  Yes   pantoprazole  (PROTONIX ) 40 MG tablet Take 1 tablet (40 mg total) by mouth 2 (two) times daily. 11/29/23  Yes Watson Hacking, MD  rivastigmine  (EXELON ) 4.5 MG capsule Take 1 capsule (4.5 mg total) by mouth 2 (two) times daily. 10/14/23  Yes McCue, Camilo Cella, NP  trihexyphenidyl  (ARTANE ) 2 MG tablet Take 0.5 tablets (1 mg total) by mouth 2 (two) times daily. 10/14/23  Yes McCue, Camilo Cella, NP  ibuprofen  (ADVIL ) 600 MG tablet Take 1 tablet (600 mg total) by mouth every 8 (eight) hours as needed for moderate pain. Patient not taking: Reported on 12/14/2023 06/04/22    Emmalene Hare, MD  influenza vaccine adjuvanted (FLUAD) 0.5 ML injection Inject into the muscle. Patient not taking: Reported on 12/14/2023 04/29/23   Liane Redman, MD  MYRBETRIQ  50 MG TB24 tablet Take 1 tablet (50 mg total) by mouth daily. Patient not taking: Reported on 10/19/2023 08/02/23     Netarsudil  Dimesylate (RHOPRESSA ) 0.02 % SOLN Place 1 drop into the left eye every evening. Patient not taking: Reported on 10/19/2023 02/11/23     nitroGLYCERIN  (NITROSTAT ) 0.4 MG SL tablet Place 1 tablet (0.4 mg total) under the tongue every 5 (five) minutes as needed for chest pain. 10/10/21   Arleen Lacer, MD  verapamil  (CALAN -SR) 120 MG CR tablet Take 1 tablet (120 mg total) by mouth every morning. Patient not taking: Reported on 12/14/2023 03/23/23   Watson Hacking, MD    Allergies   Allergies  Allergen Reactions   Anesthesia S-I-40 [Propofol ] Other (See Comments)    Delusions when medication is waking up     Aspirin  Other (See Comments)    Pt has had internal bleeding per wife   Haldol  [Haloperidol ]     Haldol  does not work with patient   Other Other (See Comments)    Severe delirium with anesthesia per spouse enhances parkinsons symptoms    Review of Systems  ROS  Neurologic Exam  Awake, alert, oriented Memory and concentration grossly intact Speech fluent, appropriate CN grossly intact Motor exam: Upper Extremities Deltoid Bicep Tricep Grip  Right 5/5 5/5 5/5 5/5  Left 5/5 5/5 5/5 5/5   Lower Extremities IP Quad PF DF EHL  Right 5/5 5/5 5/5 5/5 5/5  Left 5/5 5/5 5/5 5/5 5/5   Sensation grossly intact to LT  Impression  - 73 y.o. male with back and leg pain related to spondylosis, stenosis and spondylolisthesis L3-L5  Plan  - Will proceed with L3-5 lateral interbody fusion and posterior instrumented stabilization with right L4-5 facetectomy for decompression  I have reviewed the indications for the procedure as well as the details of the procedure and the expected  postoperative course and recovery at length with the patient and family in the office. We have also reviewed in detail the risks, benefits, and alternatives to the procedure. All questions were answered and [name] provided  informed consent to proceed.  Augusto Blonder, MD [facility]

## 2023-12-31 NOTE — Anesthesia Postprocedure Evaluation (Signed)
 Anesthesia Post Note  Patient: RYLYNN SCHONEMAN  Procedure(s) Performed: Prone trans-psoas interbody fusion - Lumbar three-Lumbar four - Lumbar four-Lumbar five, right facetectomy Lumbar four-five (Spine Lumbar) APPLICATION OF O-ARM LUMBAR PERCUTANEOUS PEDICLE SCREW LUMBAR THREE-LUMBAR FIVE (Spine Lumbar)     Patient location during evaluation: PACU Anesthesia Type: General Level of consciousness: awake and alert Pain management: pain level controlled Vital Signs Assessment: post-procedure vital signs reviewed and stable Respiratory status: spontaneous breathing, nonlabored ventilation, respiratory function stable and patient connected to nasal cannula oxygen Cardiovascular status: blood pressure returned to baseline and stable Postop Assessment: no apparent nausea or vomiting Anesthetic complications: no   No notable events documented.  Last Vitals:  Vitals:   12/31/23 1500 12/31/23 1530  BP: (!) 160/89 (!) 168/104  Pulse: 60 (!) 54  Resp: 15 10  Temp: 36.6 C   SpO2: 97% 94%    Last Pain:  Vitals:   12/31/23 0709  TempSrc:   PainSc: 0-No pain                 Lethaniel Rave

## 2023-12-31 NOTE — Progress Notes (Signed)
 Patient admitted to 4N13 from PACU. Patient presents confused with three honeycomb dressing placed to left flank, and mid to lower back. Family at bedside. Jenetta Misty, RN

## 2023-12-31 NOTE — Anesthesia Procedure Notes (Addendum)
 Procedure Name: Intubation Date/Time: 12/31/2023 8:08 AM  Performed by: Linard Reno, CRNAPre-anesthesia Checklist: Patient identified, Emergency Drugs available, Suction available, Patient being monitored and Timeout performed Patient Re-evaluated:Patient Re-evaluated prior to induction Oxygen Delivery Method: Circle system utilized Preoxygenation: Pre-oxygenation with 100% oxygen Induction Type: IV induction Ventilation: Mask ventilation without difficulty Laryngoscope Size: Glidescope and 4 Grade View: Grade I Tube size: 7.5 mm Number of attempts: 1 Airway Equipment and Method: Stylet and Video-laryngoscopy Placement Confirmation: ETT inserted through vocal cords under direct vision, breath sounds checked- equal and bilateral and CO2 detector Secured at: 23 cm Tube secured with: Tape Dental Injury: Teeth and Oropharynx as per pre-operative assessment  Comments: Pt with limited neck ROM, Neutral neck alignment maintained.

## 2023-12-31 NOTE — Transfer of Care (Signed)
 Immediate Anesthesia Transfer of Care Note  Patient: [name]  Procedure(s) Performed: Prone trans-psoas interbody fusion - Lumbar three-Lumbar four - Lumbar four-Lumbar five, right facetectomy Lumbar four-five (Spine Lumbar) APPLICATION OF O-ARM LUMBAR PERCUTANEOUS PEDICLE SCREW LUMBAR THREE-LUMBAR FIVE (Spine Lumbar)  Patient Location: PACU  Anesthesia Type:General  Level of Consciousness: drowsy  Airway & Oxygen Therapy: Patient Spontanous Breathing and Patient connected to face mask oxygen  Post-op Assessment: Report given to RN and Post -op Vital signs reviewed and stable  Post vital signs: Reviewed and stable  Last Vitals:  Vitals Value Taken Time  BP 139/84 12/31/23 1345  Temp    Pulse 58 12/31/23 1357  Resp 14 12/31/23 1357  SpO2 94 % 12/31/23 1357  Vitals shown include unfiled device data.  Last Pain:  Vitals:   12/31/23 0709  TempSrc:   PainSc: 0-No pain         Complications: No notable events documented.

## 2024-01-01 ENCOUNTER — Other Ambulatory Visit (HOSPITAL_COMMUNITY): Payer: Self-pay

## 2024-01-01 LAB — CBC
HCT: 31.8 % — ABNORMAL LOW (ref 39.0–52.0)
Hemoglobin: 10.9 g/dL — ABNORMAL LOW (ref 13.0–17.0)
MCH: 30.8 pg (ref 26.0–34.0)
MCHC: 34.3 g/dL (ref 30.0–36.0)
MCV: 89.8 fL (ref 80.0–100.0)
Platelets: 174 10*3/uL (ref 150–400)
RBC: 3.54 MIL/uL — ABNORMAL LOW (ref 4.22–5.81)
RDW: 12.4 % (ref 11.5–15.5)
WBC: 11.2 10*3/uL — ABNORMAL HIGH (ref 4.0–10.5)
nRBC: 0 % (ref 0.0–0.2)

## 2024-01-01 MED ORDER — QUETIAPINE FUMARATE 50 MG PO TABS
50.0000 mg | ORAL_TABLET | Freq: Every day | ORAL | Status: DC
  Administered 2024-01-01 – 2024-01-11 (×10): 50 mg via ORAL
  Filled 2024-01-01 (×11): qty 1

## 2024-01-01 MED ORDER — HYDRALAZINE HCL 20 MG/ML IJ SOLN
10.0000 mg | INTRAMUSCULAR | Status: DC | PRN
  Administered 2024-01-03 – 2024-01-10 (×5): 10 mg via INTRAVENOUS
  Filled 2024-01-01 (×7): qty 1

## 2024-01-01 MED ORDER — SODIUM CHLORIDE 0.9 % IV BOLUS
500.0000 mL | Freq: Once | INTRAVENOUS | Status: AC
  Administered 2024-01-01: 500 mL via INTRAVENOUS

## 2024-01-01 MED ORDER — QUETIAPINE FUMARATE 25 MG PO TABS
25.0000 mg | ORAL_TABLET | Freq: Every morning | ORAL | Status: DC
  Administered 2024-01-01 – 2024-01-12 (×11): 25 mg via ORAL
  Filled 2024-01-01 (×12): qty 1

## 2024-01-01 NOTE — Evaluation (Signed)
 Physical Therapy Evaluation Patient Details Name: ELISA SORLIE MRN: [id number] DOB: Mar 10, 1951 Today's Date: 01/01/2024  History of Present Illness  Pt is a 73 y.o. male s/p L3-5 prone trans-psoas approach for lateral interbody fusion; R L4-5 facetectomy and posterior segmental instrumentation L3-5. PMH significant for PD, HTN, DM, gout, glaucoma, PVD.  Clinical Impression  Pt in bed upon arrival of PT, agreeable to evaluation at this time. Pt restless and attempting to get OOB upon arrival, unable to re-orient to situation or apply back precautions to mobility despite education and max cues. Pt reports use of RW, and this was confirmed by conversation with wife after session. The pt presents with poor activity tolerance and significant pain, needing max cues and frequent repetition of instructions to follow commands. Pt with mild deficits in coordination noted, less command following with RUE and decreased strength in LLE compared to RLE. The pt's mobility was further limited by symptomatic drop in BP with standing (see values below). Pt currently needing modA of 2 to complete sit-stand and initial lateral steps at bedside. After discussing with pt's wife she asked about acute inpatient therapies after d/c to maximize return to independence, recommend intensive therapies >3hours/day to facilitate improved strength, coordination, activity tolerance, and balance prior to return home.          If plan is discharge home, recommend the following: Two people to help with walking and/or transfers;Two people to help with bathing/dressing/bathroom;Assistance with cooking/housework;Assistance with feeding;Direct supervision/assist for medications management;Direct supervision/assist for financial management;Assist for transportation;Help with stairs or ramp for entrance;Supervision due to cognitive status   Can travel by private vehicle        Equipment Recommendations Wheelchair (measurements  PT);Wheelchair cushion (measurements PT) (defer until gait training completed)  Recommendations for Other Services  Rehab consult    Functional Status Assessment Patient has had a recent decline in their functional status and demonstrates the ability to make significant improvements in function in a reasonable and predictable amount of time.     Precautions / Restrictions Precautions Precautions: Back Precaution Booklet Issued: Yes (comment) Recall of Precautions/Restrictions: Impaired Required Braces or Orthoses: Spinal Brace Spinal Brace: Lumbar corset Restrictions Weight Bearing Restrictions Per Provider Order: No      Mobility  Bed Mobility Overal bed mobility: Needs Assistance Bed Mobility: Rolling, Sidelying to Sit, Sit to Sidelying Rolling: Mod assist, +2 for physical assistance Sidelying to sit: Mod assist, +2 for physical assistance, +2 for safety/equipment     Sit to sidelying: Max assist, +2 for physical assistance, +2 for safety/equipment General bed mobility comments: dense multimodal cues for bed mobility to initiate and maintain precautions with implementation of log roll. One step commands throughout.    Transfers Overall transfer level: Needs assistance Equipment used: Rolling walker (2 wheels) Transfers: Sit to/from Stand Sit to Stand: Mod assist, +2 safety/equipment           General transfer comment: Assist for rise with pt attempting to put BUE on RW rather than push up from bed    Ambulation/Gait Ambulation/Gait assistance: Mod assist, +2 physical assistance, +2 safety/equipment Gait Distance (Feet): 2 Feet Assistive device: Rolling walker (2 wheels) Gait Pattern/deviations: Step-to pattern       General Gait Details: small lateral steps with max cues and assist to manage RW and balance. pt with poor standing tolerance  to progress gait due to orthostatics     Balance Overall balance assessment: Needs assistance Sitting-balance support:  Single extremity supported, Feet supported Sitting balance-Leahy Scale:  Poor Sitting balance - Comments: CGA statically   Standing balance support: Bilateral upper extremity supported, Reliant on assistive device for balance Standing balance-Leahy Scale: Poor                               Pertinent Vitals/Pain Pain Assessment Pain Assessment: Faces Faces Pain Scale: Hurts even more Pain Location: back Pain Descriptors / Indicators: Aching, Sore Pain Intervention(s): Limited activity within patient's tolerance, Monitored during session, Repositioned    Home Living Family/patient expects to be discharged to:: Private residence Living Arrangements: Spouse/significant other Available Help at Discharge: Family;Available 24 hours/day (son next door) Type of Home: House Home Access: Stairs to enter Entrance Stairs-Rails: Right;Left;Can reach both Entrance Stairs-Number of Steps: 3 side 1 front   Home Layout: Two level;Able to live on main level with bedroom/bathroom Home Equipment: Grab bars - tub/shower;Grab bars - toilet;Hand held shower head;Shower Counsellor (2 wheels);Cane - single point Additional Comments: information per wife via phone    Prior Function Prior Level of Function : Needs assist             Mobility Comments: 1/2 mile walks until 4-5 mos ago; decline for 3-4 months. OP PT/OT/SLP PTA one month prior. per wife, pt doesnt like RW, would like rollator, some use of cane ADLs Comments: Per wife, pt cardiologist aware of pt with orthostatic vitals and pt has been increasing water  intake to help with this. Pt previously independent in ADL with wife assisting with IADL     Extremity/Trunk Assessment   Upper Extremity Assessment Upper Extremity Assessment: Difficult to assess due to impaired cognition;RUE deficits/detail;LUE deficits/detail RUE Deficits / Details: decr initiation and coordination bil but R >L RUE Coordination: decreased fine  motor;decreased gross motor LUE Deficits / Details: decr initiation and coordination bil but R >:L LUE Coordination: decreased fine motor;decreased gross motor    Lower Extremity Assessment Lower Extremity Assessment: Generalized weakness;Difficult to assess due to impaired cognition;RLE deficits/detail;LLE deficits/detail RLE Deficits / Details: pt grossly 4/5 to MMT, reports sensation intact, difficulty with following commands for testing RLE Sensation: WNL RLE Coordination: decreased fine motor;decreased gross motor LLE Deficits / Details: pt grossly 3+/5, limited by poor command following, reports sensation intact LLE Sensation: WNL LLE Coordination: decreased fine motor;decreased gross motor    Cervical / Trunk Assessment Cervical / Trunk Assessment: Kyphotic;Back Surgery  Communication   Communication Communication: No apparent difficulties    Cognition Arousal: Alert Behavior During Therapy: Flat affect   PT - Cognitive impairments: History of cognitive impairments, Orientation, Awareness, Memory, Attention, Initiation, Sequencing, Problem solving, Safety/Judgement   Orientation impairments: Place, Time, Situation                   PT - Cognition Comments: pt with hx of poor tolerance of anesthesia. Following commands: Impaired Following commands impaired: Follows one step commands inconsistently     Cueing Cueing Techniques: Verbal cues, Tactile cues, Gestural cues     General Comments General comments (skin integrity, edema, etc.): orthostatic with mobility, per wife, this is pt's baseline. activity limited by dizziness        Assessment/Plan    PT Assessment Patient needs continued PT services  PT Problem List Decreased strength;Decreased range of motion;Decreased activity tolerance;Decreased balance;Decreased coordination;Decreased cognition;Decreased safety awareness;Decreased knowledge of precautions;Pain       PT Treatment Interventions DME  instruction;Gait training;Stair training;Functional mobility training;Therapeutic activities;Therapeutic exercise;Balance training;Patient/family education    PT Goals (  Current goals can be found in the Care Plan section)  Acute Rehab PT Goals Patient Stated Goal: reduce pain PT Goal Formulation: With patient Time For Goal Achievement: 01/15/24 Potential to Achieve Goals: Good    Frequency Min 5X/week        AM-PAC PT "6 Clicks" Mobility  Outcome Measure Help needed turning from your back to your side while in a flat bed without using bedrails?: A Lot Help needed moving from lying on your back to sitting on the side of a flat bed without using bedrails?: A Lot Help needed moving to and from a bed to a chair (including a wheelchair)?: A Lot Help needed standing up from a chair using your arms (e.g., wheelchair or bedside chair)?: A Lot Help needed to walk in hospital room?: Total Help needed climbing 3-5 steps with a railing? : Total 6 Click Score: 10    End of Session Equipment Utilized During Treatment: Gait belt;Back brace Activity Tolerance: Treatment limited secondary to medical complications (Comment) (orthostatic) Patient left: in bed;with call bell/phone within reach;with bed alarm set;with SCD's reapplied Nurse Communication: Mobility status;Other (comment) (BP) PT Visit Diagnosis: Unsteadiness on feet (R26.81);Other abnormalities of gait and mobility (R26.89);Muscle weakness (generalized) (M62.81);Pain Pain - Right/Left:  (central) Pain - part of body:  (back)    Time: 9562-1308 PT Time Calculation (min) (ACUTE ONLY): 25 min   Charges:   PT Evaluation $PT Eval Moderate Complexity: 1 Mod   PT General Charges $$ ACUTE PT VISIT: 1 Visit         Barnabas Booth, PT, DPT   Acute Rehabilitation Department Office [phone] Secure Chat Communication Preferred  Lona Rist 01/01/2024, 11:32 AM

## 2024-01-01 NOTE — Progress Notes (Signed)
 PT reported to this RN:  tried to work with patient, but was unsuccessful.  They did get orthostatics on him during assessment laying 159/93 HR 75 sitting 129/92 HR 70s standing (pt reported dizziness and sat back down before finishing) 92/76 HR 76  Reported to Henreitta Locus, NP

## 2024-01-01 NOTE — Progress Notes (Signed)
   Providing Compassionate, Quality Care - Together   Subjective: Nurse reports patient with confusion overnight. Wife reported Seroquel  has worked well in the past for [name] following anesthesia. 25 mg was given overnight, with improvement of his symptoms that lasted about an hour.  Objective: Vital signs in last 24 hours: Temp:  [96.3 F (35.7 C)-98.6 F (37 C)] 98 F (36.7 C) (06/07 0756) Pulse Rate:  [51-80] 80 (06/07 0756) Resp:  [8-20] 20 (06/07 0756) BP: (125-187)/(78-104) 187/104 (06/07 0756) SpO2:  [93 %-99 %] 99 % (06/07 0756)  Intake/Output from previous day: 06/06 0701 - 06/07 0700 In: 1300 [I.V.:1300] Out: 1350 [Urine:1150; Blood:200] Intake/Output this shift: No intake/output data recorded.  Patient is only oriented to self Speech clear MAE, S/S intact Incisions are covered with Honeycomb dressings; Dressings have small amount of sanguinous drainage. Right lower back incision has small superficial hematoma.   Lab Results: No results for input(s): "WBC", "HGB", "HCT", "PLT" in the last 72 hours. BMET No results for input(s): "NA", "K", "CL", "CO2", "GLUCOSE", "BUN", "CREATININE", "CALCIUM " in the last 72 hours.  Studies/Results: DG Lumbar Spine 2-3 Views Result Date: 12/31/2023 CLINICAL DATA:  L3-4 and L4-5 posterior fusion. EXAM: LUMBAR SPINE - 2-3 VIEW; DG C-ARM 1-60 MIN-NO REPORT Radiation exposure index: 49.7 mGy. COMPARISON:  July 03, 2021. FINDINGS: Two intraoperative fluoroscopic images were obtained of the lower lumbar spine. These demonstrate the patient be status post surgical posterior fusion of L3-4 and L4-5 with bilateral intrapedicular screw placement and interbody fusion. IMPRESSION: Fluoroscopic guidance provided during surgical posterior fusion of L3-4 and L4-5. Electronically Signed   By: Rosalene Colon M.D.   On: 12/31/2023 13:09   DG C-Arm 1-60 Min-No Report Result Date: 12/31/2023 Fluoroscopy was utilized by the requesting physician.   No radiographic interpretation.   DG C-Arm 1-60 Min-No Report Result Date: 12/31/2023 Fluoroscopy was utilized by the requesting physician.  No radiographic interpretation.   DG C-Arm 1-60 Min-No Report Result Date: 12/31/2023 Fluoroscopy was utilized by the requesting physician.  No radiographic interpretation.   DG C-Arm 1-60 Min-No Report Result Date: 12/31/2023 Fluoroscopy was utilized by the requesting physician.  No radiographic interpretation.   DG C-Arm 1-60 Min-No Report Result Date: 12/31/2023 Fluoroscopy was utilized by the requesting physician.  No radiographic interpretation.   DG O-ARM IMAGE ONLY/NO REPORT Result Date: 12/31/2023 There is no Radiologist interpretation  for this exam.   Assessment/Plan: Patient underwent L3-4, L4-5 trans-psoas approach for lateral interbody fusion by Dr. Nat Badger on 12/31/2023. Post op confusion/agitation overnight that appears somewhat improved this morning. Will keep in the hospital to work on mobilizing today. Discharge planning pending therapy recommendations.   LOS: 1 day    I am in communication with my attending and they agree with the plan for this patient.   Jesus Locus, DNP, AGNP-C Nurse Practitioner  [facility] [address], [place], [place] [address] P: [phone]    F: [phone]  01/01/2024, 8:38 AM

## 2024-01-01 NOTE — Progress Notes (Signed)

## 2024-01-01 NOTE — Evaluation (Signed)
 Occupational Therapy Evaluation Patient Details Name: [name] MRN: [id number] DOB: Dec 15, 1950 Today's Date: 01/01/2024   History of Present Illness   Pt is a 73 y.o. male s/p L3-5 prone trans-psoas approach for lateral interbody fusion; R L4-5 facetectomy and posterior segmental instrumentation L3-5. PMH significant for PD, HTN, DM, gout, glaucoma, PVD.     Clinical Impressions PTA, pt lived with wife who reports he was independent in ADL and mobility; was recently active with OP OT/SLP/PT services but back pain limited him from being able to go more recently. Upon eval, pt with exacerbation of PD symptoms including bradykinesia, difficulty word finding, and poor initiation; wife confirms this is typical when pt has had anaesthesia or on new medications. Pt educated regarding spinal precautions but needing cues to recall and implement. Pt currently needing +2 assist for bed mobility ad STS transfer with cues for spinal precautions and initiation throughout. Evaluation limited by orthostatic vital signs with pt dizzy on standing. Of note wife on phone reports pt has been experiencing orthostatics PTA and cardiologist aware.      If plan is discharge home, recommend the following:   Two people to help with walking and/or transfers;A lot of help with bathing/dressing/bathroom;Two people to help with bathing/dressing/bathroom;Assistance with cooking/housework;Direct supervision/assist for financial management;Direct supervision/assist for medications management;Assistance with feeding;Assist for transportation;Help with stairs or ramp for entrance;Supervision due to cognitive status     Functional Status Assessment   Patient has had a recent decline in their functional status and demonstrates the ability to make significant improvements in function in a reasonable and predictable amount of time.     Equipment Recommendations   Other (comment) (defer)     Recommendations for Other  Services   Rehab consult;Speech consult     Precautions/Restrictions   Precautions Precautions: Back Precaution Booklet Issued: Yes (comment) Recall of Precautions/Restrictions: Impaired Required Braces or Orthoses: Spinal Brace Spinal Brace: Lumbar corset Restrictions Weight Bearing Restrictions Per Provider Order: No     Mobility Bed Mobility Overal bed mobility: Needs Assistance Bed Mobility: Rolling, Sidelying to Sit, Sit to Sidelying Rolling: Mod assist, +2 for physical assistance Sidelying to sit: Mod assist, +2 for physical assistance, +2 for safety/equipment     Sit to sidelying: Max assist, +2 for physical assistance, +2 for safety/equipment General bed mobility comments: dense multimodal cues for bed mobility to initiate and maintain precautions with implementation of log roll. One step commands throughout.    Transfers Overall transfer level: Needs assistance Equipment used: Rolling walker (2 wheels) Transfers: Sit to/from Stand Sit to Stand: Mod assist, +2 safety/equipment           General transfer comment: Assist for rise with pt attempting to put BUE on RW rather than push up from bed      Balance Overall balance assessment: Needs assistance Sitting-balance support: Single extremity supported, Feet supported Sitting balance-Leahy Scale: Poor Sitting balance - Comments: CGA statically   Standing balance support: Bilateral upper extremity supported, Reliant on assistive device for balance Standing balance-Leahy Scale: Poor                             ADL either performed or assessed with clinical judgement   ADL Overall ADL's : Needs assistance/impaired Eating/Feeding: Minimal assistance;Bed level Eating/Feeding Details (indicate cue type and reason): drink sip of water  from cup Grooming: Moderate assistance;Bed level   Upper Body Bathing: Moderate assistance;Sitting   Lower Body Bathing: Maximal assistance;Sit  to/from stand    Upper Body Dressing : Moderate assistance;Sitting   Lower Body Dressing: Maximal assistance;Sit to/from stand   Toilet Transfer: Moderate assistance;+2 for physical assistance;+2 for safety/equipment Toilet Transfer Details (indicate cue type and reason): STS         Functional mobility during ADLs: Moderate assistance;Rolling walker (2 wheels) General ADL Comments: STS     Vision Baseline Vision/History: 3 Glaucoma Ability to See in Adequate Light: 1 Impaired Patient Visual Report: No change from baseline       Perception         Praxis         Pertinent Vitals/Pain Pain Assessment Pain Assessment: Faces Faces Pain Scale: Hurts little more Pain Location: back Pain Descriptors / Indicators: Aching, Sore Pain Intervention(s): Limited activity within patient's tolerance, Monitored during session, Repositioned     Extremity/Trunk Assessment Upper Extremity Assessment Upper Extremity Assessment: RUE deficits/detail;Generalized weakness;LUE deficits/detail RUE Deficits / Details: decr initiation and coordination bil but R >:L LUE Deficits / Details: decr initiation and coordination bil but R >:L   Lower Extremity Assessment Lower Extremity Assessment: Defer to PT evaluation       Communication Communication Communication: No apparent difficulties   Cognition Arousal: Alert Behavior During Therapy: Flat affect Cognition: History of cognitive impairments, Cognition impaired   Orientation impairments: Place (aware birthday was about a week ago and able to report date of birth. Unable to reportb place predominantly due to word finding difficuly >cognition) Awareness: Intellectual awareness impaired, Online awareness impaired Memory impairment (select all impairments): Short-term memory, Working memory Attention impairment (select first level of impairment): Focused attention Executive functioning impairment (select all impairments): Initiation, Organization,  Sequencing, Reasoning, Problem solving OT - Cognition Comments: Pt wife confirms that pt with cognitive symptom exacerbation with surgeries in the past as well.                 Following commands: Impaired Following commands impaired: Follows one step commands inconsistently     Cueing  General Comments   Cueing Techniques: Verbal cues;Tactile cues;Gestural cues  dizziness on standing and pt with positive orthostatics; see vitals  flow sheets   Exercises     Shoulder Instructions      Home Living Family/patient expects to be discharged to:: Private residence Living Arrangements: Spouse/significant other Available Help at Discharge: Family;Available 24 hours/day (son next door) Type of Home: House Home Access: Stairs to enter [employer] of Steps: 3 side 1 front Entrance Stairs-Rails: Right;Left;Can reach both Home Layout: Two level;Able to live on main level with bedroom/bathroom     Bathroom Shower/Tub: Producer, television/film/video: Handicapped height     Home Equipment: Grab bars - tub/shower;Grab bars - toilet;Hand held shower head;Shower Counsellor (2 wheels);Cane - single point          Prior Functioning/Environment Prior Level of Function : Needs assist             Mobility Comments: 1/2 mile walks until 4-5 mos ago; decline for 3-4 months. OP PT/OT/SLP PTA one month prior ADLs Comments: Per wife, pt cardiologist aware of pt with orthostatic vitals and pt has been increasing water  intake to help with this. Pt previously independent in ADL with wife assisting with IADL    OT Problem List: Decreased strength;Decreased activity tolerance;Impaired balance (sitting and/or standing);Decreased cognition;Decreased coordination;Decreased safety awareness;Decreased knowledge of use of DME or AE;Decreased knowledge of precautions;Pain   OT Treatment/Interventions: Self-care/ADL training;Therapeutic exercise;Neuromuscular education;DME  and/or AE instruction;Patient/family education;Balance training;Cognitive  remediation/compensation;Therapeutic activities      OT Goals(Current goals can be found in the care plan section)   Acute Rehab OT Goals Patient Stated Goal: get better OT Goal Formulation: With patient Time For Goal Achievement: 01/15/24 Potential to Achieve Goals: Good   OT Frequency:  Min 2X/week    Co-evaluation              AM-PAC OT "6 Clicks" Daily Activity     Outcome Measure Help from another person eating meals?: A Little Help from another person taking care of personal grooming?: A Lot Help from another person toileting, which includes using toliet, bedpan, or urinal?: A Lot Help from another person bathing (including washing, rinsing, drying)?: A Lot Help from another person to put on and taking off regular upper body clothing?: A Lot Help from another person to put on and taking off regular lower body clothing?: A Lot 6 Click Score: 13   End of Session Equipment Utilized During Treatment: Gait belt;Rolling walker (2 wheels);Back brace Nurse Communication: Mobility status  Activity Tolerance: Patient tolerated treatment well Patient left: in bed;with call bell/phone within reach;with bed alarm set  OT Visit Diagnosis: Unsteadiness on feet (R26.81);Muscle weakness (generalized) (M62.81);Other symptoms and signs involving cognitive function;Pain Pain - part of body:  (back)                Time: 1610-9604 OT Time Calculation (min): 25 min Charges:  OT General Charges $OT Visit: 1 Visit OT Evaluation $OT Eval Moderate Complexity: 1 Mod  Karilyn Ouch, OTR/L [facility] Acute Rehabilitation Office: [phone]   [name] 01/01/2024, 11:09 AM

## 2024-01-02 NOTE — Progress Notes (Signed)
 Physical Therapy Treatment Patient Details Name: [name] MRN: [id number] DOB: 17-Jan-1951 Today's Date: 01/02/2024   History of Present Illness Pt is a 73 y.o. male s/p L3-5 prone trans-psoas approach for lateral interbody fusion; R L4-5 facetectomy and posterior segmental instrumentation L3-5. PMH significant for PD, HTN, DM, gout, glaucoma, PVD.    PT Comments  The pt was agreeable to session, reports no recall of PT session or back precautions from yesterday. He remains limited by orthostatic hypotension with mobility (see values below), but was able to progress activity tolerance and initiate steps in room with use of RW and modA. The pt continues to need significant assist of 2 for OOB mobility, continue to recommend intensive therapies to progress back to independence with gait to allow for safest return home with family support.   VITALS:  - supine in bed - BP: 151/95 (108); HR: 70bpm - sitting EOB - BP: 132/88 (101); HR: 75bpm - standing - BP: 113/76 (85); HR: 75bpm - standing after 3 min - BP: 95/58 (71); HR: 75bpm - sitting in recliner - BP: 114/91 (100); HR: 72bpm      If plan is discharge home, recommend the following: Two people to help with walking and/or transfers;Two people to help with bathing/dressing/bathroom;Assistance with cooking/housework;Assistance with feeding;Direct supervision/assist for medications management;Direct supervision/assist for financial management;Assist for transportation;Help with stairs or ramp for entrance;Supervision due to cognitive status   Can travel by private vehicle        Equipment Recommendations  Wheelchair (measurements PT);Wheelchair cushion (measurements PT) (defer until gait training completed)    Recommendations for Other Services Rehab consult     Precautions / Restrictions Precautions Precautions: Back Precaution Booklet Issued: Yes (comment) Recall of Precautions/Restrictions: Impaired Precaution/Restrictions  Comments: orthostatic Required Braces or Orthoses: Spinal Brace Spinal Brace: Lumbar corset;Applied in sitting position Restrictions Weight Bearing Restrictions Per Provider Order: No     Mobility  Bed Mobility Overal bed mobility: Needs Assistance Bed Mobility: Rolling, Sidelying to Sit Rolling: Mod assist Sidelying to sit: Mod assist       General bed mobility comments: dense multimodal cues for bed mobility to initiate and maintain precautions with implementation of log roll. One step commands throughout. improved adherence to cues today, improved use of RUE to transition to sitting    Transfers Overall transfer level: Needs assistance Equipment used: Rolling walker (2 wheels) Transfers: Sit to/from Stand, Bed to chair/wheelchair/BSC Sit to Stand: Mod assist, +2 safety/equipment   Step pivot transfers: Mod assist, +2 physical assistance       General transfer comment: Assist for rise with pt attempting to put BUE on RW rather than push up from bed, cues for stepping back, assist with RW    Ambulation/Gait Ambulation/Gait assistance: Mod assist, +2 physical assistance, +2 safety/equipment Gait Distance (Feet): 5 Feet Assistive device: Rolling walker (2 wheels) Gait Pattern/deviations: Step-to pattern Gait velocity: decreased Gait velocity interpretation: <1.31 ft/sec, indicative of household ambulator   General Gait Details: small pivotal steps and then backwards steps to reach chair. limited by soft BP   Stairs             Wheelchair Mobility     Tilt Bed    Modified Rankin (Stroke Patients Only)       Balance Overall balance assessment: Needs assistance Sitting-balance support: Single extremity supported, Feet supported Sitting balance-Leahy Scale: Poor Sitting balance - Comments: CGA statically   Standing balance support: Bilateral upper extremity supported, Reliant on assistive device for balance Standing balance-Leahy  Scale: Poor                               Communication Communication Communication: No apparent difficulties  Cognition Arousal: Alert Behavior During Therapy: Flat affect   PT - Cognitive impairments: History of cognitive impairments, Orientation, Awareness, Memory, Attention, Initiation, Sequencing, Problem solving, Safety/Judgement   Orientation impairments: Place, Time, Situation                   PT - Cognition Comments: pt with hx of poor tolerance of anesthesia, does not recall PT session from day before. no recall of back precautions. able to follow simple commands, increased cues Following commands: Impaired Following commands impaired: Follows one step commands with increased time    Cueing Cueing Techniques: Verbal cues, Tactile cues, Gestural cues  Exercises      General Comments General comments (skin integrity, edema, etc.): again orthostatic from 151/95 to 95/58      Pertinent Vitals/Pain Pain Assessment Pain Assessment: Faces Faces Pain Scale: Hurts even more Pain Location: back Pain Descriptors / Indicators: Aching, Sore Pain Intervention(s): Monitored during session, Repositioned, Limited activity within patient's tolerance    Home Living                          Prior Function            PT Goals (current goals can now be found in the care plan section) Acute Rehab PT Goals Patient Stated Goal: reduce pain PT Goal Formulation: With patient Time For Goal Achievement: 01/15/24 Potential to Achieve Goals: Good Progress towards PT goals: Progressing toward goals    Frequency    Min 5X/week      PT Plan      Co-evaluation              AM-PAC PT "6 Clicks" Mobility   Outcome Measure  Help needed turning from your back to your side while in a flat bed without using bedrails?: A Lot Help needed moving from lying on your back to sitting on the side of a flat bed without using bedrails?: A Lot Help needed moving to and from a bed  to a chair (including a wheelchair)?: A Lot Help needed standing up from a chair using your arms (e.g., wheelchair or bedside chair)?: A Lot Help needed to walk in hospital room?: Total Help needed climbing 3-5 steps with a railing? : Total 6 Click Score: 10    End of Session Equipment Utilized During Treatment: Gait belt;Back brace Activity Tolerance: Treatment limited secondary to medical complications (Comment) (orthostatic) Patient left: with call bell/phone within reach;in chair;with chair alarm set Nurse Communication: Mobility status;Other (comment) (BP) PT Visit Diagnosis: Unsteadiness on feet (R26.81);Other abnormalities of gait and mobility (R26.89);Muscle weakness (generalized) (M62.81);Pain Pain - Right/Left:  (central) Pain - part of body:  (back)     Time: 9147-8295 PT Time Calculation (min) (ACUTE ONLY): 24 min  Charges:    $Therapeutic Exercise: 8-22 mins $Therapeutic Activity: 8-22 mins PT General Charges $$ ACUTE PT VISIT: 1 Visit                     Barnabas Booth, PT, DPT   Acute Rehabilitation Department Office [phone] Secure Chat Communication Preferred   Lona Rist 01/02/2024, 3:07 PM

## 2024-01-02 NOTE — Progress Notes (Signed)
 Patient transfer to 3W. Report given to receiving nurse.  Jenetta Misty, RN

## 2024-01-02 NOTE — Progress Notes (Signed)
   Providing Compassionate, Quality Care - Together   Subjective: Patient is resting comfortably upon assessment.  Objective: Vital signs in last 24 hours: Temp:  [98.1 F (36.7 C)-98.4 F (36.9 C)] 98.3 F (36.8 C) (06/08 0825) Pulse Rate:  [69-83] 81 (06/08 0825) Resp:  [12-20] 20 (06/08 0825) BP: (80-175)/(51-94) 169/92 (06/08 0825) SpO2:  [94 %-99 %] 98 % (06/08 0825)  Intake/Output from previous day: 06/07 0701 - 06/08 0700 In: 504.8 [I.V.:4.8; IV Piggyback:500] Out: 700 [Urine:700] Intake/Output this shift: Total I/O In: 3 [I.V.:3] Out: -   Patient is only oriented to self Speech clear MAE, S/S intact Incisions are covered with Honeycomb dressings; Dressings have small amount of sanguinous drainage. Right lower back incision has small superficial hematoma that has not enlarged from previous assessment.  Lab Results: Recent Labs    01/01/24 1848  WBC 11.2*  HGB 10.9*  HCT 31.8*  PLT 174   BMET No results for input(s): "NA", "K", "CL", "CO2", "GLUCOSE", "BUN", "CREATININE", "CALCIUM " in the last 72 hours.  Studies/Results: DG Lumbar Spine 2-3 Views Result Date: 12/31/2023 CLINICAL DATA:  L3-4 and L4-5 posterior fusion. EXAM: LUMBAR SPINE - 2-3 VIEW; DG C-ARM 1-60 MIN-NO REPORT Radiation exposure index: 49.7 mGy. COMPARISON:  July 03, 2021. FINDINGS: Two intraoperative fluoroscopic images were obtained of the lower lumbar spine. These demonstrate the patient be status post surgical posterior fusion of L3-4 and L4-5 with bilateral intrapedicular screw placement and interbody fusion. IMPRESSION: Fluoroscopic guidance provided during surgical posterior fusion of L3-4 and L4-5. Electronically Signed   By: Rosalene Colon M.D.   On: 12/31/2023 13:09   DG C-Arm 1-60 Min-No Report Result Date: 12/31/2023 Fluoroscopy was utilized by the requesting physician.  No radiographic interpretation.   DG C-Arm 1-60 Min-No Report Result Date: 12/31/2023 Fluoroscopy was utilized  by the requesting physician.  No radiographic interpretation.   DG C-Arm 1-60 Min-No Report Result Date: 12/31/2023 Fluoroscopy was utilized by the requesting physician.  No radiographic interpretation.   DG C-Arm 1-60 Min-No Report Result Date: 12/31/2023 Fluoroscopy was utilized by the requesting physician.  No radiographic interpretation.   DG C-Arm 1-60 Min-No Report Result Date: 12/31/2023 Fluoroscopy was utilized by the requesting physician.  No radiographic interpretation.    Assessment/Plan: Patient underwent L3-4, L4-5 trans-psoas approach for lateral interbody fusion by Dr. Nat Badger on 12/31/2023. Patient is less agitated with Seroquel , but still quite confused. Discharge planning pending therapy recommendations. It appears the patient might be a candidate for CIR.   LOS: 2 days    I am in communication with my attending and they agree with the plan for this patient.   Henreitta Locus, DNP, AGNP-C Nurse Practitioner  [facility] [address], [place], [place] [address] P: [phone]    F: [phone]  01/02/2024, 10:48 AM

## 2024-01-02 NOTE — Plan of Care (Signed)

## 2024-01-03 ENCOUNTER — Encounter: Payer: Self-pay | Admitting: Adult Health

## 2024-01-03 DIAGNOSIS — I10 Essential (primary) hypertension: Secondary | ICD-10-CM

## 2024-01-03 DIAGNOSIS — D62 Acute posthemorrhagic anemia: Secondary | ICD-10-CM

## 2024-01-03 DIAGNOSIS — I739 Peripheral vascular disease, unspecified: Secondary | ICD-10-CM | POA: Diagnosis not present

## 2024-01-03 DIAGNOSIS — G20A1 Parkinson's disease without dyskinesia, without mention of fluctuations: Secondary | ICD-10-CM

## 2024-01-03 DIAGNOSIS — R410 Disorientation, unspecified: Secondary | ICD-10-CM

## 2024-01-03 DIAGNOSIS — M4316 Spondylolisthesis, lumbar region: Secondary | ICD-10-CM

## 2024-01-03 DIAGNOSIS — D72829 Elevated white blood cell count, unspecified: Secondary | ICD-10-CM | POA: Diagnosis not present

## 2024-01-03 MED ORDER — ENOXAPARIN SODIUM 40 MG/0.4ML IJ SOSY
40.0000 mg | PREFILLED_SYRINGE | INTRAMUSCULAR | Status: DC
  Administered 2024-01-03 – 2024-01-12 (×9): 40 mg via SUBCUTANEOUS
  Filled 2024-01-03 (×11): qty 0.4

## 2024-01-03 MED ORDER — NETARSUDIL-LATANOPROST 0.02-0.005 % OP SOLN
1.0000 [drp] | Freq: Every day | OPHTHALMIC | Status: DC
  Administered 2024-01-03 – 2024-01-11 (×4): 1 [drp] via OPHTHALMIC
  Filled 2024-01-03: qty 0.1

## 2024-01-03 MED ORDER — ENSURE PLUS HIGH PROTEIN PO LIQD
237.0000 mL | Freq: Two times a day (BID) | ORAL | Status: DC
  Administered 2024-01-03 – 2024-01-11 (×14): 237 mL via ORAL

## 2024-01-03 NOTE — Consult Note (Addendum)
 Physical Medicine and Rehabilitation Consult Reason for Consult:Rehab Referring Physician: Dr. Maritza Sidles   HPI: [name] is a 73 y.o. male with past medical history of anxiety, BPH, PVD, sleep apnea, hypertension, Parkinson's disease who was admitted for lumbar surgery.  Patient had back pain and right greater than left leg pain.  Patient had L3-4, L4-5 transpsoas approach  lateral interbody fusion by Dr. Nat Badger on 12/31/2023.  Patient has had some confusion since his surgery, has been ordered Seroquel .  Patient worked with PT and OT and found to have functional deficits largely in the mod assist level and felt to be a candidate for CIR.  Patient reports he lives in a two-level home with 3-4 steps to enter.  He reports his wife can assist 24/7 after he gets home.  Home: Home Living Family/patient expects to be discharged to:: Private residence Living Arrangements: Spouse/significant other Available Help at Discharge: Family, Available 24 hours/day Type of Home: House Home Access: Stairs to enter [employer] of Steps: 3 side 1 front Entrance Stairs-Rails: Right, Left, Can reach both Home Layout: Two level, Able to live on main level with bedroom/bathroom Bathroom Shower/Tub: Health visitor: Handicapped height Bathroom Accessibility: Yes Home Equipment: Grab bars - tub/shower, Grab bars - toilet, Hand held shower head, Shower seat, Agricultural consultant (2 wheels), Cane - single point Additional Comments: information per wife via phone  Functional History: Prior Function Prior Level of Function : Needs assist Mobility Comments: 1/2 mile walks until 4-5 mos ago; decline for 3-4 months. OP PT/OT/SLP PTA one month prior. per wife, pt doesnt like RW, would like rollator, some use of cane ADLs Comments: Per wife, pt cardiologist aware of pt with orthostatic vitals and pt has been increasing water  intake to help with this. Pt previously independent in ADL  with wife assisting with IADL Functional Status:  Mobility: Bed Mobility Overal bed mobility: Needs Assistance Bed Mobility: Rolling, Sidelying to Sit, Sit to Sidelying Rolling: Min assist, Used rails Sidelying to sit: Mod assist, Used rails, HOB elevated Sit to sidelying: +2 for physical assistance, Mod assist, Used rails, HOB elevated General bed mobility comments: continual multimodal cues. Difficulty with sequencing and adherence to back precautions. Increased time Transfers Overall transfer level: Needs assistance Equipment used: Rolling walker (2 wheels) Transfers: Sit to/from Stand Sit to Stand: +2 safety/equipment, Mod assist Bed to/from chair/wheelchair/BSC transfer type:: Step pivot Step pivot transfers: Mod assist, +2 physical assistance General transfer comment: cues for sequencing, increased time Ambulation/Gait Ambulation/Gait assistance: Mod assist, +2 physical assistance, +2 safety/equipment Gait Distance (Feet): 50 Feet Assistive device: Rolling walker (2 wheels) Gait Pattern/deviations: Step-through pattern, Decreased stride length General Gait Details: amb short distance in hallway. Assist with RW and maintaining balance. Difficulty following cues requiring +2 hands on assist. Gait velocity: decreased Gait velocity interpretation: <1.31 ft/sec, indicative of household ambulator    ADL: ADL Overall ADL's : Needs assistance/impaired Eating/Feeding: Minimal assistance, Bed level Eating/Feeding Details (indicate cue type and reason): drink sip of water  from cup Grooming: Moderate assistance Grooming Details (indicate cue type and reason): significant cues needed, physical assist to manage toothpaste and water  Upper Body Bathing: Moderate assistance, Sitting Lower Body Bathing: Maximal assistance, Sit to/from stand Upper Body Dressing : Moderate assistance, Sitting Lower Body Dressing: Maximal assistance, Sit to/from stand Toilet Transfer: Moderate assistance, +2  for safety/equipment, +2 for physical assistance Toilet Transfer Details (indicate cue type and reason): simulated with STS and ambulation Functional mobility during ADLs: Moderate  assistance, +2 for safety/equipment, +2 for physical assistance, Rolling walker (2 wheels) General ADL Comments: signigicant cues for intiation, sequensing and termination of tasks  Cognition: Cognition Orientation Level: Oriented to person Cognition Arousal: Alert Behavior During Therapy: Flat affect   Review of Systems  Constitutional:  Negative for chills and fever.  HENT:  Negative for congestion.   Eyes:        Chronic vision deficits  Respiratory:  Negative for shortness of breath.   Cardiovascular:  Negative for chest pain.  Gastrointestinal:  Negative for abdominal pain, constipation, diarrhea, nausea and vomiting.  Genitourinary:  Negative for dysuria.  Musculoskeletal:  Positive for back pain.  Neurological:  Positive for tremors, sensory change and weakness. Negative for headaches.   Past Medical History:  Diagnosis Date   Allergy    seasonal   Anxiety    Arthritis    BPH (benign prostatic hyperplasia)    Complication of anesthesia    Pt. stated he had a reaction that ended in him requiring urinary cath placement; hx delirium worsening parkinson symptoms   Depression    Diverticulosis    GERD (gastroesophageal reflux disease)    esophageal spasms   Glaucoma    Gout    Head injury, closed, with concussion    Hepatitis C    chronic - Has been treated with Harvoni   HLD (hyperlipidemia)    statin intolerant (Crestor  & Simvastatin ) - Taking Livalo  1mg  / week   Hypertension    Neuromuscular disorder (HCC)    Parkinson's Disease   Parkinson's disease (HCC)    Plantar fasciitis    right   PVD (peripheral vascular disease) (HCC)    With no claudication; only mild abdominal aortic atherosclerosis noted on ultrasound.   Sleep apnea    Wears CPAP nightly   Spinal stenosis of lumbar  region    Thoracic ascending aortic aneurysm (HCC)    4.2 cm ascending TAA 09/2016 CT, 1 yr f/u rec   Ulcer    Past Surgical History:  Procedure Laterality Date   BUNIONECTOMY WITH WEIL OSTEOTOMY Right 11/02/2019   Procedure: Right Foot Lapidus, Modified McBride Bunionectomy,  Hallux Akin Osteotomy;  Surgeon: Amada Backer, MD;  Location: [facility];  Service: Orthopedics;  Laterality: Right;   CARDIAC CATHETERIZATION  2005   30% Cx. Dr. Ebbie Goldmann   cataract surgery Left 11/05/2014   COLONOSCOPY     COLONOSCOPY N/A 01/09/2021   Procedure: COLONOSCOPY;  Surgeon: Genell Ken, MD;  Location: [facility];  Service: Gastroenterology;  Laterality: N/A;   ESOPHAGOGASTRODUODENOSCOPY N/A 05/02/2015   Procedure: ESOPHAGOGASTRODUODENOSCOPY (EGD);  Surgeon: Lanita Pitman, MD;  Location: [facility];  Service: Endoscopy;  Laterality: N/A;   ESOPHAGOGASTRODUODENOSCOPY  05/02/2015   no source of pt chest pain endoscopically evident. small hiatal hernia.   ESOPHAGOGASTRODUODENOSCOPY (EGD) WITH PROPOFOL  N/A 01/02/2021   Procedure: ESOPHAGOGASTRODUODENOSCOPY (EGD) WITH PROPOFOL ;  Surgeon: [name] Blunt, MD;  Location: [facility];  Service: Endoscopy;  Laterality: N/A;   HARDWARE REMOVAL Right 11/02/2019   Procedure: Second Metatarsal Removal of Deep Implant and Rotational Osteotomy;  Surgeon: Amada Backer, MD;  Location: [facility];  Service: Orthopedics;  Laterality: Right;   IR ANGIOGRAM SELECTIVE EACH ADDITIONAL VESSEL  01/04/2021   IR ANGIOGRAM SELECTIVE EACH ADDITIONAL VESSEL  01/04/2021   IR ANGIOGRAM VISCERAL SELECTIVE  01/04/2021   IR US  GUIDE VASC ACCESS RIGHT  01/04/2021   LUMBAR LAMINECTOMY/DECOMPRESSION MICRODISCECTOMY N/A 07/03/2021   Procedure: Lumbar three through five decompression with lumbar three  through five insitu fusion;  Surgeon: Mort Ards, MD;  Location: [facility];  Service: Orthopedics;  Laterality: N/A;   MEMBRANE PEEL Left 03/14/2014   Procedure:  MEMBRANE PEEL; ENDOLASER;  Surgeon: Shon Downing, MD;  Location: [facility] OR;  Service: Ophthalmology;  Laterality: Left;   NM MYOVIEW  LTD  10/2015   LOW RISK. Small, fixed basal lateral defect - likely diaphragmatic attenuation. 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  No R WMA.  GRII DD-moderately dilated LA..  Mildly dilated RV but normal function.  Normal RAP/CVP.  Trivial AI with mild to moderate sclerosis-no stenosis   UMBILICAL HERNIA REPAIR  2023   UPPER GASTROINTESTINAL ENDOSCOPY     WEIL OSTEOTOMY Right 09/01/2017   Procedure: RIGHT GREAT TOE CHEVRON AND WEIL OSTEOTOMY 2ND METATARSAL;  Surgeon: Timothy Ford, MD;  Location: [facility];  Service: Orthopedics;  Laterality: Right;   XI ROBOTIC ASSISTED VENTRAL HERNIA N/A 03/17/2022   Procedure: XI ROBOTIC ASSISTED VENTRAL HERNIA;  Surgeon: Emmalene Hare, MD;  Location: [facility]S;  Service: General;  Laterality: N/A;   Family History  Problem Relation Age of Onset   Heart disease Mother    Cancer Sister    Cancer Paternal Grandfather    Sleep apnea Neg Hx    Social History:  reports that he quit smoking about 35 years ago. His smoking use included cigars. He has never used smokeless tobacco. He reports that he does not currently use alcohol after a past usage of about 3.0 standard drinks of alcohol per week. He reports that he does not currently use drugs after having used the following drugs: Codeine . Allergies:  Allergies  Allergen Reactions   Anesthesia S-I-40 [Propofol ] Other (See Comments)    Delusions when medication is waking up     Aspirin  Other (See Comments)    Pt has had internal bleeding per wife   Haldol  [Haloperidol ]     Haldol  does not work with patient    Other Other (See Comments)    Severe delirium with anesthesia per spouse enhances parkinsons symptoms   Medications Prior to Admission  Medication Sig Dispense Refill   acetaminophen  (TYLENOL ) 500 MG tablet Take 2 tablets (1,000 mg total) by mouth every 6 (six) hours as needed for mild pain.     acetaminophen -codeine  (TYLENOL  #3) 300-30 MG tablet Take 0.5 tablets by mouth every 8 (eight) hours as needed for moderate pain (pain score 4-6).     alfuzosin  (UROXATRAL ) 10 MG 24 hr tablet Take 1 tablet (10 mg total) by mouth daily. 90 tablet 3   atorvastatin  (LIPITOR) 20 MG tablet Take 1 tablet (20 mg total) by mouth daily. 90 tablet 3   CALCIUM -VITAMIN D PO Take 1 tablet by mouth daily.     carbidopa -levodopa  (SINEMET  IR) 25-100 MG tablet Take 1.5 tablets by mouth 3 (three) times daily. 405 tablet 3   citalopram  (CELEXA ) 20 MG tablet Take 1 tablet (20 mg total) by mouth at bedtime. 90 tablet 1   dorzolamide -timolol  (COSOPT ) 22.3-6.8 MG/ML ophthalmic solution Instill 1 drop into both eyes twice a day 10 mL 11   FIBER ADULT GUMMIES PO Take 1 tablet by mouth 2 (two) times daily.  ibuprofen  (ADVIL ) 600 MG tablet Take 1 tablet (600 mg total) by mouth 3 (three) times daily as needed. 90 tablet 2   irbesartan  (AVAPRO ) 75 MG tablet Take 1 tablet (75 mg total) by mouth every morning. 30 tablet 2   Multiple Vitamins-Minerals (MULTIVITAMIN ADULTS 50+ PO) Take 1 tablet by mouth daily.     Netarsudil -Latanoprost  (ROCKLATAN ) 0.02-0.005 % SOLN Place 1 drop into the left eye every night at bedtime. 2.5 mL 3   pantoprazole  (PROTONIX ) 40 MG tablet Take 1 tablet (40 mg total) by mouth 2 (two) times daily. 180 tablet 2   rivastigmine  (EXELON ) 4.5 MG capsule Take 1 capsule (4.5 mg total) by mouth 2 (two) times daily. 180 capsule 3   trihexyphenidyl  (ARTANE ) 2 MG tablet Take 0.5 tablets (1 mg total) by mouth 2 (two) times daily. 30 tablet 11   MYRBETRIQ  50 MG TB24 tablet Take 1 tablet (50 mg total) by mouth daily.  (Patient not taking: Reported on 10/19/2023) 30 tablet 11   Netarsudil  Dimesylate (RHOPRESSA ) 0.02 % SOLN Place 1 drop into the left eye every evening. (Patient not taking: Reported on 10/19/2023) 2.5 mL 11   nitroGLYCERIN  (NITROSTAT ) 0.4 MG SL tablet Place 1 tablet (0.4 mg total) under the tongue every 5 (five) minutes as needed for chest pain. 25 tablet 3   verapamil  (CALAN -SR) 120 MG CR tablet Take 1 tablet (120 mg total) by mouth every morning. (Patient not taking: Reported on 12/14/2023) 90 tablet 3     Blood pressure (!) 140/79, pulse 71, temperature 98.5 F (36.9 C), temperature source Oral, resp. rate 15, height 5\' 9"  (1.753 m), weight 68 kg, SpO2 96%. Physical Exam  General: No apparent distress, sitting in bed HEENT: Head is normocephalic, atraumatic, sclera anicteric, oral mucosa pink and moist, right eye cloudy-Has difficulty counting fingers with right eye Neck: Supple without JVD or lymphadenopathy Heart: Reg rate and rhythm. Chest: CTA bilaterally without wheezes, rales, or rhonchi; no distress Abdomen: Soft, non-tender, non-distended, bowel sounds positive. Extremities: No clubbing, cyanosis, or edema. Pulses are 2+ Psych: Pt's affect is appropriate. Pt is cooperative. pleasant Skin: Surgical dressing CDI Neuro: Alert and oriented to person and month only. patient is confused.  Slightly delayed responses.  Memory deficits noted.  Cranial nerves grossly intact other than altered vision right eye   MOTOR: 4+ out of 5 in bilateral upper extremities RLE: HF 4-/5, KE 4-/5, ADF 4-/5, APF 4-/5 LLE: HF 4-/5, KE 4-/5, ADF 4-/5, APF 3/5  Mild bilateral upper extremity tremor  SENSORY: Altered sensation in right lower extremity greater than left lower extremity     No results found. However, due to the size of the patient record, not all encounters were searched. Please check Results Review for a complete set of results. No results found.  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 Yes Co-Morbidities requiring supervision/potential complications:  - Parkinson's disease, depression, aortic aneurysm, hypertension, hyperlipidemia, peripheral vascular disease, delirium, leukocytosis, acute blood loss anemia Due to bladder management, bowel management, safety, skin/wound care, disease management, medication administration, pain management, and patient education, does the patient require 24 hr/day rehab nursing? Yes Does the patient require coordinated care of a physician, rehab nurse, therapy disciplines of PT/OT to address physical and functional deficits in the context  of the above medical diagnosis(es)? Yes Addressing deficits in the following areas: balance, endurance, locomotion, strength, transferring, bowel/bladder control, bathing, dressing, feeding, grooming, toileting, cognition, speech, language, swallowing, and psychosocial support Can the patient actively participate in an intensive therapy program of at least 3 hrs of therapy per day at least 5 days per week? Yes The potential for patient to make measurable gains while on inpatient rehab is excellent Anticipated functional outcomes upon discharge from inpatient rehab are modified independent and supervision  with PT, modified independent and supervision with OT, modified independent and supervision with SLP. Estimated rehab length of stay to reach the above functional goals is: 7-10 Anticipated discharge destination: Home Overall Rehab/Functional Prognosis: excellent  POST ACUTE RECOMMENDATIONS: This patient's condition is appropriate for continued rehabilitative care in the following setting: CIR Patient has agreed to participate in recommended program.  Yes Note that insurance prior authorization may be required for reimbursement for recommended care.  Comment: I think you be a good candidate for CIR.  Rehab coordinator to follow-up   I have personally performed a face to face diagnostic evaluation of this patient. Additionally, I have examined the patient's medical record including any pertinent labs and radiographic images.    Thanks,  Lylia Sand, MD 01/03/2024

## 2024-01-03 NOTE — Plan of Care (Signed)

## 2024-01-03 NOTE — PMR Pre-admission (Shared)
 PMR Admission Coordinator Pre-Admission Assessment  Patient: [name] is an 73 y.o., male MRN: [id number] DOB: [birth year] Height: 5\' 9"  (175.3 cm) Weight: 68 kg              Insurance Information HMO: ***    PPO: ***     PCP: ***     IPA: ***     80/20: ***     OTHER: *** PRIMARY: [employer] Medicare     Policy#: [id number], group [id number]      Subscriber: patient CM Name: ***      Phone#: [phone]     Fax#: [phone] Pre-Cert#: [id number]       Eff. Date: 07/28/23-07/26/24      Deduct: does not have one      Out of Pocket Max: $3,900 ($536.99 met)        CIR: $300/day co pay days 1-5, $0/day copay days 6+      SNF: $0 co pay for days 1-20 Outpatient: $20 day co pay/visit      Home Health: 100% coverage       DME: 80% coverage      Providers: ***   The "Data Collection Information Summary" for patients in Inpatient Rehabilitation Facilities with attached "Privacy Act Statement-Health Care Records" was provided and verbally reviewed with: Patient and Family  Emergency Contact Information Contact Information     Name Relation Home Work Mobile   [name],[name] Spouse [phone]  [phone]   Jesus, Maxwell [phone]  [phone]      Other Contacts   None on File    Current Medical History  Patient Admitting Diagnosis: 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 male s/p L3-5 prone trans-psoas approach for lateral interbody fusion; R L4-5 facetectomy and posterior segmental instrumentation L3-5. Patient has had post surgical confusion and orthostatic hypotension.  PMH significant for PD, HTN, DM, gout, glaucoma, PVD.       Patient's medical record from [name] has been reviewed by the rehabilitation admission coordinator and physician.  Past Medical History  Past Medical History:  Diagnosis Date   Allergy    seasonal   Anxiety    Arthritis    BPH (benign prostatic hyperplasia)    Complication of anesthesia    Pt. stated he had a  reaction that ended in him requiring urinary cath placement; hx delirium worsening parkinson symptoms   Depression    Diverticulosis    GERD (gastroesophageal reflux disease)    esophageal spasms   Glaucoma    Gout    Head injury, closed, with concussion    Hepatitis C    chronic - Has been treated with Harvoni   HLD (hyperlipidemia)    statin intolerant (Crestor  & Simvastatin ) - Taking Livalo  1mg  / week   Hypertension    Neuromuscular disorder (HCC)    Parkinson's Disease   Parkinson's disease (HCC)    Plantar fasciitis    right   PVD (peripheral vascular disease) (HCC)    With no claudication; only mild abdominal aortic atherosclerosis noted on ultrasound.   Sleep apnea    Wears CPAP nightly   Spinal stenosis of lumbar region    Thoracic ascending aortic aneurysm (HCC)    4.2 cm ascending TAA 09/2016 CT, 1 yr f/u rec   Ulcer     Has the patient had major surgery during 100 days prior to admission? Yes  Family History  family history includes Cancer in his paternal grandfather and sister; Heart disease  in his mother.   Current Medications   Current Facility-Administered Medications:    acetaminophen  (TYLENOL ) tablet 650 mg, 650 mg, Oral, Q4H PRN, 650 mg at 01/02/24 1749 **OR** acetaminophen  (TYLENOL ) suppository 650 mg, 650 mg, Rectal, Q4H PRN, Nundkumar, Neelesh, MD   alfuzosin  (UROXATRAL ) 24 hr tablet 10 mg, 10 mg, Oral, Daily, Nundkumar, Neelesh, MD, 10 mg at 01/03/24 1478   atorvastatin  (LIPITOR) tablet 20 mg, 20 mg, Oral, Daily, Nundkumar, Neelesh, MD, 20 mg at 01/03/24 2956   bisacodyl  (DULCOLAX) suppository 10 mg, 10 mg, Rectal, Daily PRN, Nundkumar, Neelesh, MD   carbidopa -levodopa  (SINEMET  IR) 25-100 MG per tablet immediate release 1.5 tablet, 1.5 tablet, Oral, TID, Nundkumar, Neelesh, MD, 1.5 tablet at 01/03/24 2130   citalopram  (CELEXA ) tablet 20 mg, 20 mg, Oral, QHS, Nundkumar, Neelesh, MD, 20 mg at 01/02/24 2106   docusate sodium  (COLACE) capsule 100 mg, 100  mg, Oral, BID, Nundkumar, Neelesh, MD, 100 mg at 01/03/24 8657   dorzolamide -timolol  (COSOPT ) 2-0.5 % ophthalmic solution 1 drop, 1 drop, Both Eyes, BID, Augusto Blonder, MD, 1 drop at 01/03/24 0904   enoxaparin (LOVENOX) injection 40 mg, 40 mg, Subcutaneous, Q24H, Easter Golden Caylin, PA-C, 40 mg at 01/03/24 1139   hydrALAZINE  (APRESOLINE ) injection 10 mg, 10 mg, Intravenous, Q4H PRN, Bergman, Meghan D, NP   irbesartan  (AVAPRO ) tablet 75 mg, 75 mg, Oral, q morning, Nundkumar, Neelesh, MD, 75 mg at 01/03/24 8469   menthol -cetylpyridinium (CEPACOL) lozenge 3 mg, 1 lozenge, Oral, PRN **OR** phenol (CHLORASEPTIC) mouth spray 1 spray, 1 spray, Mouth/Throat, PRN, Augusto Blonder, MD   methocarbamol  (ROBAXIN ) tablet 500 mg, 500 mg, Oral, Q6H PRN, 500 mg at 01/01/24 1621 **OR** methocarbamol  (ROBAXIN ) injection 500 mg, 500 mg, Intravenous, Q6H PRN, Nundkumar, Neelesh, MD   morphine  (PF) 2 MG/ML injection 2 mg, 2 mg, Intravenous, Q2H PRN, Nundkumar, Neelesh, MD, 2 mg at 01/01/24 0504   multivitamin with minerals tablet 1 tablet, 1 tablet, Oral, Daily, Nundkumar, Neelesh, MD, 1 tablet at 01/03/24 6295   Netarsudil -Latanoprost  0.02-0.005 % SOLN 1 drop, 1 drop, Left Eye, QHS, Pham, Minh Q, RPH-CPP   nitroGLYCERIN  (NITROSTAT ) SL tablet 0.4 mg, 0.4 mg, Sublingual, Q5 min PRN, Nundkumar, Neelesh, MD   ondansetron  (ZOFRAN ) tablet 4 mg, 4 mg, Oral, Q6H PRN **OR** ondansetron  (ZOFRAN ) injection 4 mg, 4 mg, Intravenous, Q6H PRN, Nundkumar, Neelesh, MD   oxyCODONE  (Oxy IR/ROXICODONE ) immediate release tablet 10 mg, 10 mg, Oral, Q3H PRN, Nundkumar, Neelesh, MD, 10 mg at 01/01/24 2150   oxyCODONE  (Oxy IR/ROXICODONE ) immediate release tablet 5 mg, 5 mg, Oral, Q3H PRN, Nundkumar, Neelesh, MD   pantoprazole  (PROTONIX ) EC tablet 40 mg, 40 mg, Oral, BID, Nundkumar, Neelesh, MD, 40 mg at 01/03/24 2841   QUEtiapine  (SEROQUEL ) tablet 25 mg, 25 mg, Oral, q morning, Bergman, Meghan D, NP, 25 mg at 01/03/24 3244    QUEtiapine  (SEROQUEL ) tablet 50 mg, 50 mg, Oral, QHS, Bergman, Meghan D, NP, 50 mg at 01/02/24 2105   rivastigmine  (EXELON ) capsule 4.5 mg, 4.5 mg, Oral, BID, Nundkumar, Neelesh, MD, 4.5 mg at 01/03/24 0904   senna (SENOKOT) tablet 8.6 mg, 1 tablet, Oral, BID, Nundkumar, Neelesh, MD, 8.6 mg at 01/02/24 2106   sodium chloride  flush (NS) 0.9 % injection 3 mL, 3 mL, Intravenous, Q12H, Nundkumar, Jacinta Martinis, MD, 3 mL at 01/03/24 0905   sodium chloride  flush (NS) 0.9 % injection 3 mL, 3 mL, Intravenous, PRN, Nundkumar, Neelesh, MD   trihexyphenidyl  (ARTANE ) tablet 1 mg, 1 mg, Oral, BID WC, Nundkumar, Neelesh, MD, 1 mg  at 01/03/24 0902  Patients Current Diet:  Diet Order             Diet regular Room service appropriate? Yes; Fluid consistency: Thin  Diet effective now                   Precautions / Restrictions Precautions Precautions: Back Precaution Booklet Issued: Yes (comment) Precaution/Restrictions Comments: no retention of education on precautions Spinal Brace: Lumbar corset, Applied in sitting position Restrictions Weight Bearing Restrictions Per Provider Order: No Other Position/Activity Restrictions: dependent with brace don/doff   Has the patient had 2 or more falls or a fall with injury in the past year?No  Prior Activity Level    Prior Functional Level Prior Function Prior Level of Function : Needs assist Mobility Comments: 1/2 mile walks until 4-5 mos ago; decline for 3-4 months. OP PT/OT/SLP PTA one month prior. per wife, pt doesnt like RW, would like rollator, some use of cane ADLs Comments: Per wife, pt cardiologist aware of pt with orthostatic vitals and pt has been increasing water  intake to help with this. Pt previously independent in ADL with wife assisting with IADL  Self Care: Did the patient need help bathing, dressing, using the toilet or eating?  Independent  Indoor Mobility: Did the patient need assistance with walking from room to room (with or without  device)? Needed some help  Stairs: Did the patient need assistance with internal or external stairs (with or without device)? Needed some help  Functional Cognition: Did the patient need help planning regular tasks such as shopping or remembering to take medications? Needed some help  Patient Information Are you of Hispanic, Latino/a,or Spanish origin?: A. No, not of Hispanic, Latino/a, or Spanish origin What is your race?: B. Black or African American Do you need or want an interpreter to communicate with a doctor or health care staff?: 0. No  Patient's Response To:  Health Literacy and Transportation Is the patient able to respond to health literacy and transportation needs?: No Health Literacy - How often do you need to have someone help you when you read instructions, pamphlets, or other written material from your doctor or pharmacy?:  (patient confused)  Home Assistive Devices / Equipment Home Equipment: Grab bars - tub/shower, Grab bars - toilet, Hand held shower head, Shower seat, Agricultural consultant (2 wheels), Cane - single point  Prior Device Use: Indicate devices/aids used by the patient prior to current illness, exacerbation or injury? Walker  Current Functional Level Cognition  Orientation Level: Oriented to person    Extremity Assessment (includes Sensation/Coordination)  Upper Extremity Assessment: Difficult to assess due to impaired cognition, RUE deficits/detail, LUE deficits/detail RUE Deficits / Details: decr initiation and coordination bil but R >L RUE Coordination: decreased fine motor, decreased gross motor LUE Deficits / Details: decr initiation and coordination bil but R >:L LUE Coordination: decreased fine motor, decreased gross motor  Lower Extremity Assessment: Generalized weakness, Difficult to assess due to impaired cognition, RLE deficits/detail, LLE deficits/detail RLE Deficits / Details: pt grossly 4/5 to MMT, reports sensation intact, difficulty with  following commands for testing RLE Sensation: WNL RLE Coordination: decreased fine motor, decreased gross motor LLE Deficits / Details: pt grossly 3+/5, limited by poor command following, reports sensation intact LLE Sensation: WNL LLE Coordination: decreased fine motor, decreased gross motor    ADLs  Overall ADL's : Needs assistance/impaired Eating/Feeding: Minimal assistance, Bed level Eating/Feeding Details (indicate cue type and reason): drink sip of water  from cup Grooming: Moderate  assistance, Bed level Upper Body Bathing: Moderate assistance, Sitting Lower Body Bathing: Maximal assistance, Sit to/from stand Upper Body Dressing : Moderate assistance, Sitting Lower Body Dressing: Maximal assistance, Sit to/from stand Toilet Transfer: Moderate assistance, +2 for physical assistance, +2 for safety/equipment Toilet Transfer Details (indicate cue type and reason): STS Functional mobility during ADLs: Moderate assistance, Rolling walker (2 wheels) General ADL Comments: STS    Mobility  Overal bed mobility: Needs Assistance Bed Mobility: Rolling, Sidelying to Sit, Sit to Sidelying Rolling: Min assist, Used rails Sidelying to sit: Mod assist, Used rails, HOB elevated Sit to sidelying: +2 for physical assistance, Mod assist, Used rails, HOB elevated General bed mobility comments: continual multimodal cues. Difficulty with sequencing and adherence to back precautions. Increased time    Transfers  Overall transfer level: Needs assistance Equipment used: Rolling walker (2 wheels) Transfers: Sit to/from Stand Sit to Stand: +2 safety/equipment, Mod assist Bed to/from chair/wheelchair/BSC transfer type:: Step pivot Step pivot transfers: Mod assist, +2 physical assistance General transfer comment: cues for sequencing, increased time    Ambulation / Gait / Stairs / Wheelchair Mobility  Ambulation/Gait Ambulation/Gait assistance: Mod assist, +2 physical assistance, +2  safety/equipment Gait Distance (Feet): 50 Feet Assistive device: Rolling walker (2 wheels) Gait Pattern/deviations: Step-through pattern, Decreased stride length General Gait Details: amb short distance in hallway. Assist with RW and maintaining balance. Difficulty following cues requiring +2 hands on assist. Gait velocity: decreased Gait velocity interpretation: <1.31 ft/sec, indicative of household ambulator    Posture / Balance Dynamic Sitting Balance Sitting balance - Comments: CGA statically Balance Overall balance assessment: Needs assistance Sitting-balance support: Single extremity supported, Feet supported Sitting balance-Leahy Scale: Fair Sitting balance - Comments: CGA statically Standing balance support: Bilateral upper extremity supported, Reliant on assistive device for balance, During functional activity Standing balance-Leahy Scale: Poor    Special needs/care consideration Skin *** and Diabetic management ***     Previous Home Environment (from acute therapy documentation) Living Arrangements: Spouse/significant other Available Help at Discharge: Family, Available 24 hours/day Type of Home: House Home Layout: Two level, Able to live on main level with bedroom/bathroom Home Access: Stairs to enter Entrance Stairs-Rails: Right, Left, Can reach both Entrance Stairs-Number of Steps: 3 side 1 front Bathroom Shower/Tub: Health visitor: Handicapped height Bathroom Accessibility: Yes How Accessible: Accessible via walker Home Care Services: No Additional Comments: information per wife via phone  Discharge Living Setting Plans for Discharge Living Setting: Patient's home, Lives with (comment) (spouse) Discharge Home Layout: Two level, Able to live on main level with bedroom/bathroom Discharge Home Access: Stairs to enter Entrance Stairs-Rails: Right, Left, Can reach both Entrance Stairs-Number of Steps: 3 side 1 front Discharge Bathroom Shower/Tub:  Walk-in shower Discharge Bathroom Toilet: Handicapped height Discharge Bathroom Accessibility: Yes How Accessible: Accessible via walker Does the patient have any problems obtaining your medications?: No  Social/Family/Support Systems Anticipated Caregiver: spouse, Felipa Horsfall Anticipated Industrial/product designer Information: [phone] Caregiver Availability: 24/7 Discharge Plan Discussed with Primary Caregiver: Yes Is Caregiver In Agreement with Plan?: Yes Does Caregiver/Family have Issues with Lodging/Transportation while Pt is in Rehab?: No   Goals Patient/Family Goal for Rehab: min A, PT/OT, Indep SLP Pt/Family Agrees to Admission and willing to participate: Yes Program Orientation Provided & Reviewed with Pt/Caregiver Including Roles  & Responsibilities: Yes  Barriers to Discharge: Insurance for SNF coverage   Decrease burden of Care through IP rehab admission: Othern/a   Possible need for SNF placement upon discharge:not anticipated   Patient Condition: This patient's medical  and functional status has changed since the consult dated ______ in which the Rehabilitation Physician determined and documented that the patient was potentially appropriate for intensive rehabilitative care in an inpatient rehabilitation facility. Issues have been addressed and update has been discussed with Dr. Stewart Elk doctor:[id number]} and patient now appropriate for inpatient rehabilitation. Will admit to inpatient rehab today.   Preadmission Screen Completed By:  Verlie Glisson, 01/03/2024 12:04 PM ______________________________________________________________________   Discussed status with Dr. Aaron Aason***at *** and received approval for admission today.  Admission Coordinator:  Verlie Glisson, time***/Date***

## 2024-01-03 NOTE — Progress Notes (Signed)
    Providing Compassionate, Quality Care - Together   NEUROSURGERY PROGRESS NOTE     S: No issues overnight. Continues with confusion, wife reports typical following general anesthesia.    O: EXAM:  BP 139/84 (BP Location: Left Arm)   Pulse 76   Temp 98 F (36.7 C) (Oral)   Resp 20   Ht 5\' 9"  (1.753 m)   Wt 68 kg   SpO2 95%   BMI 22.15 kg/m     Awake, alert, oriented to person Speech fluent BUE/BLE 5/5 SILTx4 Dressings intact   ASSESSMENT:  73 y.o. s/p L3-5 lateral fusion, posterior instrumentation.     PLAN: -Rehab consult pending -Continue therapies as tolerated -Continue seroquel  for confusion -Call w/ questions/concerns.   Easter Golden, [facility]

## 2024-01-03 NOTE — Care Management Important Message (Signed)
 Important Message  Patient Details  Name: IZYK MARTY MRN: [id number] Date of Birth: [birth year]   Important Message Given:        Wynonia Hedges 01/03/2024, 2:50 PM

## 2024-01-03 NOTE — TOC CM/SW Note (Signed)
 Transition of Care [facility]) - Inpatient Brief Assessment   Patient Details  Name: [name] MRN: [id number] Date of Birth: April 20, 1951  Transition of Care [facility]) CM/SW Contact:    Jonathan Neighbor, RN Phone Number: 01/03/2024, 12:01 PM   Clinical Narrative:  CIR working up   Transition of Care Asessment: Insurance and Status: Insurance coverage has been reviewed Patient has primary care physician: Yes Home environment has been reviewed: home with spouse   Prior/Current Home Services: No current home services Social Drivers of Health Review: SDOH reviewed no interventions necessary Readmission risk has been reviewed: Yes Transition of care needs: transition of care needs identified, TOC will continue to follow

## 2024-01-03 NOTE — Care Management Important Message (Signed)
 Important Message  Patient Details  Name: [name] MRN: [id number] Date of Birth: [birth year]   Important Message Given:  Yes - Medicare IM     Wynonia Hedges 01/03/2024, 2:50 PM

## 2024-01-03 NOTE — Progress Notes (Signed)
 Occupational Therapy Treatment Patient Details Name: [name] MRN: [id number] DOB: Dec 13, 1950 Today's Date: 01/03/2024   History of present illness Pt is a 73 y.o. male s/p L3-5 prone trans-psoas approach for lateral interbody fusion; R L4-5 facetectomy and posterior segmental instrumentation L3-5. PMH significant for PD, HTN, DM, gout, glaucoma, PVD   OT comments  Pt is making solid progress towards their acute OT goals. Pt see with OT to progress functional mobility and activity tolerance. Overall he was able to complete all aspects of mobility with mod A +2 and cues for precautions and RW management. He stood at the sink for grooming task with min A for static standing balance and mod A for oral hygiene task. Throughout session pt needed significant cues for initiation, sequencing and termination of all tasks. He was noted to undershoot most items, pt did state he wears glasses but therapist could not locate them acutely. OT to continue to follow acutely to facilitate progress towards established goals. Pt will continue to benefit from intensive inpatient follow up therapy, >3 hours/day after discharge.       If plan is discharge home, recommend the following:  Two people to help with walking and/or transfers;A lot of help with bathing/dressing/bathroom;Two people to help with bathing/dressing/bathroom;Assistance with cooking/housework;Direct supervision/assist for financial management;Direct supervision/assist for medications management;Assistance with feeding;Assist for transportation;Help with stairs or ramp for entrance;Supervision due to cognitive status   Equipment Recommendations  Other (comment)    Recommendations for Other Services Rehab consult;Speech consult    Precautions / Restrictions Precautions Precautions: Back Precaution Booklet Issued: Yes (comment) Recall of Precautions/Restrictions: Impaired Precaution/Restrictions Comments: no retention of education on  precautions Required Braces or Orthoses: Spinal Brace Spinal Brace: Lumbar corset;Applied in sitting position Restrictions Weight Bearing Restrictions Per Provider Order: No Other Position/Activity Restrictions: dependent with brace don/doff       Mobility Bed Mobility Overal bed mobility: Needs Assistance Bed Mobility: Rolling, Sidelying to Sit, Sit to Sidelying Rolling: Min assist, Used rails Sidelying to sit: Mod assist, Used rails, HOB elevated     Sit to sidelying: +2 for physical assistance, Mod assist, Used rails, HOB elevated General bed mobility comments: continual multimodal cues. Difficulty with sequencing and adherence to back precautions. Increased time    Transfers Overall transfer level: Needs assistance Equipment used: Rolling walker (2 wheels) Transfers: Sit to/from Stand Sit to Stand: +2 safety/equipment, Mod assist     Step pivot transfers: Mod assist, +2 physical assistance     General transfer comment: cues for sequencing, increased time     Balance Overall balance assessment: Needs assistance Sitting-balance support: Single extremity supported, Feet supported Sitting balance-Leahy Scale: Fair Sitting balance - Comments: CGA statically   Standing balance support: Bilateral upper extremity supported, Reliant on assistive device for balance, During functional activity Standing balance-Leahy Scale: Poor                             ADL either performed or assessed with clinical judgement   ADL Overall ADL's : Needs assistance/impaired     Grooming: Moderate assistance Grooming Details (indicate cue type and reason): significant cues needed, physical assist to manage toothpaste and water                  Toilet Transfer: Moderate assistance;+2 for safety/equipment;+2 for physical assistance Toilet Transfer Details (indicate cue type and reason): simulated with STS and ambulation         Functional mobility during  ADLs:  Moderate assistance;+2 for safety/equipment;+2 for physical assistance;Rolling walker (2 wheels) General ADL Comments: signigicant cues for intiation, sequensing and termination of tasks    Extremity/Trunk Assessment Upper Extremity Assessment Upper Extremity Assessment: Difficult to assess due to impaired cognition RUE Deficits / Details: decr initiation and coordination bil but R >L RUE Coordination: decreased fine motor;decreased gross motor LUE Deficits / Details: decr initiation and coordination bil but R >:L LUE Coordination: decreased fine motor;decreased gross motor   Lower Extremity Assessment Lower Extremity Assessment: Defer to PT evaluation        Vision   Vision Assessment?: Vision impaired- to be further tested in functional context Additional Comments: pt often closing eyes, needed cues to correct   Perception Perception Perception: Not tested   Praxis Praxis Praxis: Not tested   Communication Communication Communication: No apparent difficulties   Cognition Arousal: Alert Behavior During Therapy: Flat affect Cognition: History of cognitive impairments, Cognition impaired   Orientation impairments: Place Awareness: Intellectual awareness impaired, Online awareness impaired Memory impairment (select all impairments): Short-term memory, Working memory Attention impairment (select first level of impairment): Focused attention Executive functioning impairment (select all impairments): Initiation, Organization, Sequencing, Reasoning, Problem solving OT - Cognition Comments: Pt wife confirms that pt with cognitive symptom exacerbation with surgeries in the past as well - pt followed most simple functional commands. oriented to self and place                 Following commands: Impaired Following commands impaired: Follows one step commands with increased time, Follows one step commands inconsistently      Cueing   Cueing Techniques: Verbal cues, Tactile  cues, Gestural cues        General Comments no signs/symptoms of OH    Pertinent Vitals/ Pain       Pain Assessment Pain Assessment: Faces Faces Pain Scale: Hurts little more Pain Descriptors / Indicators: Grimacing, Discomfort Pain Intervention(s): Limited activity within patient's tolerance, Monitored during session  Home Living   Living Arrangements: Spouse/significant other Available Help at Discharge: Family;Available 24 hours/day Type of Home: House Home Access: Stairs to enter [employer] of Steps: 3 side 1 front Entrance Stairs-Rails: Right;Left;Can reach both Home Layout: Two level;Able to live on main level with bedroom/bathroom     Bathroom Shower/Tub: Producer, television/film/video: Handicapped height Bathroom Accessibility: Yes How Accessible: Accessible via walker                Frequency  Min 2X/week        Progress Toward Goals  OT Goals(current goals can now be found in the care plan section)  Progress towards OT goals: Progressing toward goals  Acute Rehab OT Goals Patient Stated Goal: to talk to family OT Goal Formulation: With patient Time For Goal Achievement: 01/15/24 Potential to Achieve Goals: Good ADL Goals Pt Will Perform Grooming: with set-up;sitting Pt Will Perform Upper Body Dressing: with set-up;sitting Pt Will Perform Lower Body Dressing: with contact guard assist;sit to/from stand Pt Will Transfer to Toilet: with min assist;ambulating Additional ADL Goal #1: Pt will perform bed mobility within spinal precautions with min A Additional ADL Goal #2: Pt will recall and self implement spinal precautions with min cues.  Plan      Co-evaluation    PT/OT/SLP Co-Evaluation/Treatment: Yes Reason for Co-Treatment: Necessary to address cognition/behavior during functional activity;For patient/therapist safety;To address functional/ADL transfers PT goals addressed during session: Mobility/safety with  mobility;Balance;Proper use of DME OT goals addressed during session: ADL's  and self-care      AM-PAC OT "6 Clicks" Daily Activity     Outcome Measure   Help from another person eating meals?: A Little Help from another person taking care of personal grooming?: A Lot Help from another person toileting, which includes using toliet, bedpan, or urinal?: A Lot Help from another person bathing (including washing, rinsing, drying)?: A Lot Help from another person to put on and taking off regular upper body clothing?: A Lot Help from another person to put on and taking off regular lower body clothing?: A Lot 6 Click Score: 13    End of Session Equipment Utilized During Treatment: Gait belt;Rolling walker (2 wheels);Back brace  OT Visit Diagnosis: Unsteadiness on feet (R26.81);Muscle weakness (generalized) (M62.81);Other symptoms and signs involving cognitive function;Pain   Activity Tolerance Patient tolerated treatment well   Patient Left in bed;with call bell/phone within reach;with bed alarm set   Nurse Communication Mobility status        Time: 1007-1030 OT Time Calculation (min): 23 min  Charges: OT General Charges $OT Visit: 1 Visit OT Treatments $Self Care/Home Management : 8-22 mins  Veryl Gottron, OTR/L Acute Rehabilitation Services Office [phone] Secure Chat Communication Preferred   Starla Easterly 01/03/2024, 12:15 PM

## 2024-01-03 NOTE — Plan of Care (Signed)
  Problem: Education: Goal: Knowledge of General Education information will improve Description: Including pain rating scale, medication(s)/side effects and non-pharmacologic comfort measures Outcome: Not Progressing   Problem: Health Behavior/Discharge Planning: Goal: Ability to manage health-related needs will improve Outcome: Not Progressing   Problem: Clinical Measurements: Goal: Ability to maintain clinical measurements within normal limits will improve Outcome: Not Progressing  Patient is confused

## 2024-01-03 NOTE — Progress Notes (Signed)
 Inpatient Rehab Coordinator Note:  I met with patient at bedside and wife via phone to discuss CIR recommendations and goals/expectations of CIR stay.  We reviewed 3 hrs/day of therapy, physician follow up, and average length of stay 2 weeks (dependent upon progress) with goals of min A. Wife, Felipa Horsfall states she is able to assist him and is available 24/7, son lives next door as well. Will move forward with pursuing CIR admission.  Rehab Admissons Coordinator Sadira Standard, [place], [place] [phone]

## 2024-01-03 NOTE — Progress Notes (Signed)
 Initial Nutrition Assessment  DOCUMENTATION CODES:  Non-severe (moderate) malnutrition in context of chronic illness  INTERVENTION:  Continue regular diet as ordered Ensure Plus High Protein po BID, each supplement provides 350 kcal and 20 grams of protein. Magic cup TID with meals, each supplement provides 290 kcal and 9 grams of protein MVI with minerals daily  NUTRITION DIAGNOSIS:  Moderate Malnutrition related to chronic illness (parkinson's disease) as evidenced by mild fat depletion, moderate muscle depletion, severe muscle depletion  GOAL:  Patient will meet greater than or equal to 90% of their needs  MONITOR:  PO intake, Supplement acceptance, Labs, Weight trends  REASON FOR ASSESSMENT:  Consult Assessment of nutrition requirement/status  ASSESSMENT:  Pt admitted with back and leg pain related to spondylosis, stenosis and spondylolisthesis L3-L5. PMH significant for Parkinson's disease, HTN, GERD.   6/6: s/p L3-4, L4-5 trans-psoas approach for lateral interbody fusion  Pt has had post op confusion.  Discharge plan pending. Possible plan for CIR.  Question reliability of nutrition history. Flowsheet reflects pt to be oriented to self only.  Pt states that his appetite is getting better. He mentions at home he was eating well but had difficulty recalling his typical intake. Upon further questioning he mentions that he came to the hospital previously and found out that his nutritional status was not as good as he thought it was.   No documented meal completion on file to review.   Pt states that he lost about 10 lbs recently but cannot recall when the weight loss began or what caused this weight loss.  Reviewed documented weight history over the last year. It appears pt's weight has remained stabled however unable to adequately assess for weight changes recently as it is likely the last 3 weights over the last month are stated versus measured.   Medications: colace BID,  MVI, senna BID  Labs reviewed  NUTRITION - FOCUSED PHYSICAL EXAM: Flowsheet Row Most Recent Value  Orbital Region Mild depletion  Upper Arm Region Severe depletion  Thoracic and Lumbar Region Moderate depletion  Buccal Region Mild depletion  Temple Region Mild depletion  Clavicle Bone Region Moderate depletion  Clavicle and Acromion Bone Region Severe depletion  Scapular Bone Region Severe depletion  Dorsal Hand Moderate depletion  Patellar Region Severe depletion  Anterior Thigh Region Severe depletion  Posterior Calf Region Severe depletion  Edema (RD Assessment) None  Hair Reviewed  Eyes Reviewed  Mouth Reviewed  Skin Reviewed  Nails Reviewed   Diet Order:   Diet Order             Diet regular Room service appropriate? Yes; Fluid consistency: Thin  Diet effective now                   EDUCATION NEEDS:  Not appropriate for education at this time  Skin:  Skin Assessment: Reviewed RN Assessment (closed lower back incision)  Last BM:  6/9 type 3 large  Height:  Ht Readings from Last 1 Encounters:  12/31/23 5\' 9"  (1.753 m)    Weight:  Wt Readings from Last 1 Encounters:  12/31/23 68 kg   BMI:  Body mass index is 22.15 kg/m.  Estimated Nutritional Needs:   Kcal:  1700-1900  Protein:  90-105g  Fluid:  >/=1.7L  Rocklin Chute, RDN, LDN Clinical Nutrition See [facility] for contact information.

## 2024-01-03 NOTE — Progress Notes (Addendum)
 Physical Therapy Treatment Patient Details Name: [name] MRN: [id number] DOB: 01/05/1951 Today's Date: 01/03/2024   History of Present Illness Pt is a 73 y.o. male s/p L3-5 prone trans-psoas approach for lateral interbody fusion; R L4-5 facetectomy and posterior segmental instrumentation L3-5. PMH significant for PD, HTN, DM, gout, glaucoma, PVD    PT Comments  Pt progressing well with mobility. Mod assist bed mobility, mod assist +2 safety transfers, and +2 mod assist amb 50' with RW. Poor retention of education on back precautions. Continual multimodal cues for sequencing. Pt returned to bed at end of session.     If plan is discharge home, recommend the following: Two people to help with walking and/or transfers;Two people to help with bathing/dressing/bathroom;Assistance with cooking/housework;Assistance with feeding;Direct supervision/assist for medications management;Direct supervision/assist for financial management;Assist for transportation;Help with stairs or ramp for entrance;Supervision due to cognitive status   Can travel by private vehicle        Equipment Recommendations  Wheelchair (measurements PT);Wheelchair cushion (measurements PT);Rolling walker (2 wheels)    Recommendations for Other Services       Precautions / Restrictions Precautions Precautions: Back Recall of Precautions/Restrictions: Impaired Precaution/Restrictions Comments: no retention of education on precautions Required Braces or Orthoses: Spinal Brace Spinal Brace: Lumbar corset;Applied in sitting position Restrictions Other Position/Activity Restrictions: dependent with brace don/doff     Mobility  Bed Mobility Overal bed mobility: Needs Assistance Bed Mobility: Rolling, Sidelying to Sit, Sit to Sidelying Rolling: Min assist, Used rails Sidelying to sit: Mod assist, Used rails, HOB elevated     Sit to sidelying: +2 for physical assistance, Mod assist, Used rails, HOB elevated General  bed mobility comments: continual multimodal cues. Difficulty with sequencing and adherence to back precautions. Increased time    Transfers Overall transfer level: Needs assistance Equipment used: Rolling walker (2 wheels) Transfers: Sit to/from Stand Sit to Stand: +2 safety/equipment, Mod assist           General transfer comment: cues for sequencing, increased time    Ambulation/Gait Ambulation/Gait assistance: Mod assist, +2 physical assistance, +2 safety/equipment Gait Distance (Feet): 50 Feet Assistive device: Rolling walker (2 wheels) Gait Pattern/deviations: Step-through pattern, Decreased stride length Gait velocity: decreased Gait velocity interpretation: <1.31 ft/sec, indicative of household ambulator   General Gait Details: amb short distance in hallway. Assist with RW and maintaining balance. Difficulty following cues requiring +2 hands on assist.   Stairs             Wheelchair Mobility     Tilt Bed    Modified Rankin (Stroke Patients Only)       Balance Overall balance assessment: Needs assistance Sitting-balance support: Single extremity supported, Feet supported Sitting balance-Leahy Scale: Fair     Standing balance support: Bilateral upper extremity supported, Reliant on assistive device for balance, During functional activity Standing balance-Leahy Scale: Poor                              Communication Communication Communication: No apparent difficulties  Cognition Arousal: Alert Behavior During Therapy: Flat affect   PT - Cognitive impairments: History of cognitive impairments, Orientation, Awareness, Memory, Attention, Initiation, Sequencing, Problem solving, Safety/Judgement   Orientation impairments: Place, Time, Situation                   PT - Cognition Comments: Pt with h/o poor tolerance of anesthesia Following commands: Impaired Following commands impaired: Follows one step commands with increased  time,  Follows one step commands inconsistently    Cueing Cueing Techniques: Verbal cues, Tactile cues, Gestural cues  Exercises      General Comments General comments (skin integrity, edema, etc.): no c/o dizziness      Pertinent Vitals/Pain Pain Assessment Pain Assessment: Faces Faces Pain Scale: Hurts little more Pain Location: BLE Pain Descriptors / Indicators: Grimacing, Discomfort Pain Intervention(s): Monitored during session, Limited activity within patient's tolerance, Repositioned    Home Living                          Prior Function            PT Goals (current goals can now be found in the care plan section) Acute Rehab PT Goals Patient Stated Goal: not stated Progress towards PT goals: Progressing toward goals    Frequency    Min 5X/week      PT Plan      Co-evaluation PT/OT/SLP Co-Evaluation/Treatment: Yes Reason for Co-Treatment: Necessary to address cognition/behavior during functional activity;For patient/therapist safety;To address functional/ADL transfers PT goals addressed during session: Mobility/safety with mobility;Balance;Proper use of DME        AM-PAC PT "6 Clicks" Mobility   Outcome Measure  Help needed turning from your back to your side while in a flat bed without using bedrails?: A Lot Help needed moving from lying on your back to sitting on the side of a flat bed without using bedrails?: A Lot Help needed moving to and from a bed to a chair (including a wheelchair)?: A Lot Help needed standing up from a chair using your arms (e.g., wheelchair or bedside chair)?: A Lot Help needed to walk in hospital room?: A Lot Help needed climbing 3-5 steps with a railing? : Total 6 Click Score: 11    End of Session Equipment Utilized During Treatment: Gait belt;Back brace Activity Tolerance: Patient tolerated treatment well Patient left: in bed;with bed alarm set;with call bell/phone within reach Nurse Communication: Mobility  status PT Visit Diagnosis: Unsteadiness on feet (R26.81);Other abnormalities of gait and mobility (R26.89);Muscle weakness (generalized) (M62.81);Pain     Time: 1006-1030 PT Time Calculation (min) (ACUTE ONLY): 24 min  Charges:    $Gait Training: 8-22 mins PT General Charges $$ ACUTE PT VISIT: 1 Visit                     Dorothye Gathers., PT  Office # [phone]    Guadelupe Leech 01/03/2024, 11:28 AM

## 2024-01-04 ENCOUNTER — Other Ambulatory Visit: Payer: Self-pay

## 2024-01-04 NOTE — Progress Notes (Signed)
 Inpatient Rehab Admissions Coordinator:   Continue to wait for insurance approval for CIR admission. Will continue to follow for potential admit later this week.   Rehab Admissons Coordinator Deveney Bayon, [place], [place] [phone]

## 2024-01-04 NOTE — Progress Notes (Signed)
 Physical Therapy Treatment Patient Details Name: [name] MRN: [id number] DOB: 30-Nov-1950 Today's Date: 01/04/2024   History of Present Illness Pt is a 73 y.o. male s/p L3-5 prone trans-psoas approach for lateral interbody fusion; R L4-5 facetectomy and posterior segmental instrumentation L3-5. PMH significant for PD, HTN, DM, gout, glaucoma, PVD    PT Comments  Pt tolerated treatment well today. Pt was able to progress ambulation distance in hallway with +2 Min A RW. Pt unable to recall spinal precautions and required reinforcement. Pt also remains confused asking if we were at church. No change in DC/DME recs at this time. PT will continue to follow.     If plan is discharge home, recommend the following: Two people to help with walking and/or transfers;Two people to help with bathing/dressing/bathroom;Assistance with cooking/housework;Assistance with feeding;Direct supervision/assist for medications management;Direct supervision/assist for financial management;Assist for transportation;Help with stairs or ramp for entrance;Supervision due to cognitive status   Can travel by private vehicle        Equipment Recommendations  Wheelchair (measurements PT);Wheelchair cushion (measurements PT);Rolling walker (2 wheels)    Recommendations for Other Services       Precautions / Restrictions Precautions Precautions: Back Precaution Booklet Issued: No Recall of Precautions/Restrictions: Impaired Precaution/Restrictions Comments: no retention of education on precautions Required Braces or Orthoses: Spinal Brace Spinal Brace: Lumbar corset;Applied in sitting position Restrictions Weight Bearing Restrictions Per Provider Order: No Other Position/Activity Restrictions: dependent with brace don/doff     Mobility  Bed Mobility Overal bed mobility: Needs Assistance Bed Mobility: Rolling, Sidelying to Sit Rolling: Contact guard assist Sidelying to sit: Contact guard assist        General bed mobility comments: continual multimodal cues. Good adherence to back precautions. Increased time    Transfers Overall transfer level: Needs assistance Equipment used: Rolling walker (2 wheels) Transfers: Sit to/from Stand Sit to Stand: Min assist, +2 safety/equipment           General transfer comment: cues for sequencing, increased time    Ambulation/Gait Ambulation/Gait assistance: Contact guard assist, Min assist, +2 safety/equipment Gait Distance (Feet): 150 Feet Assistive device: Rolling walker (2 wheels) Gait Pattern/deviations: Step-through pattern, Decreased stride length Gait velocity: decreased   Pre-gait activities: standing marches General Gait Details: Cues for sequencing and precautions. Chair follow provided however ultimately not needed. Min A for obstacle navigation.   Stairs             Wheelchair Mobility     Tilt Bed    Modified Rankin (Stroke Patients Only)       Balance Overall balance assessment: Needs assistance Sitting-balance support: Single extremity supported, Feet supported Sitting balance-Leahy Scale: Fair Sitting balance - Comments: CGA statically   Standing balance support: Bilateral upper extremity supported, Reliant on assistive device for balance, During functional activity Standing balance-Leahy Scale: Poor                              Communication Communication Communication: No apparent difficulties  Cognition Arousal: Alert Behavior During Therapy: Flat affect   PT - Cognitive impairments: History of cognitive impairments, Orientation, Awareness, Memory, Attention, Initiation, Sequencing, Problem solving, Safety/Judgement   Orientation impairments: Place, Time, Situation                   PT - Cognition Comments: Pt with h/o poor tolerance of anesthesia. Pt states we are at church when asked where we are. Following commands: Impaired Following commands  impaired: Follows one  step commands with increased time, Follows one step commands inconsistently    Cueing Cueing Techniques: Verbal cues, Tactile cues, Gestural cues  Exercises      General Comments General comments (skin integrity, edema, etc.): VSS      Pertinent Vitals/Pain Pain Assessment Pain Assessment: No/denies pain    Home Living                          Prior Function            PT Goals (current goals can now be found in the care plan section) Progress towards PT goals: Progressing toward goals    Frequency    Min 5X/week      PT Plan      Co-evaluation              AM-PAC PT "6 Clicks" Mobility   Outcome Measure  Help needed turning from your back to your side while in a flat bed without using bedrails?: A Lot Help needed moving from lying on your back to sitting on the side of a flat bed without using bedrails?: A Lot Help needed moving to and from a bed to a chair (including a wheelchair)?: A Lot Help needed standing up from a chair using your arms (e.g., wheelchair or bedside chair)?: A Lot Help needed to walk in hospital room?: A Lot Help needed climbing 3-5 steps with a railing? : Total 6 Click Score: 11    End of Session Equipment Utilized During Treatment: Back brace Activity Tolerance: Patient tolerated treatment well Patient left: in chair;with call bell/phone within reach;with chair alarm set Nurse Communication: Mobility status PT Visit Diagnosis: Unsteadiness on feet (R26.81);Other abnormalities of gait and mobility (R26.89);Muscle weakness (generalized) (M62.81);Pain     Time: 4098-1191 PT Time Calculation (min) (ACUTE ONLY): 16 min  Charges:    $Gait Training: 8-22 mins PT General Charges $$ ACUTE PT VISIT: 1 Visit                     [name], PT, DPT Acute Rehab Services [phone]    [name] 01/04/2024, 3:09 PM

## 2024-01-04 NOTE — Progress Notes (Signed)
  NEUROSURGERY PROGRESS NOTE   No issues overnight. Pt doing well with PT/OT, ambulating around unit.  EXAM:  BP (!) 200/101 (BP Location: Right Arm)   Pulse 69   Temp 98.4 F (36.9 C) (Oral)   Resp 18   Ht 5\' 9"  (1.753 m)   Wt 68 kg   SpO2 100%   BMI 22.15 kg/m   Awake, alert, remains somewhat confused Speech fluent CN grossly intact  5/5 BUE/BLE  Wounds c/d/i  IMPRESSION:  73 y.o. male POD# 4 s/p L2-5 lateral interbody fusion/posterior stabilization. Appears to be ambulating much better than preop. Confusion likely due to underlying dementia and recent GA but appears to be slowly improving.  PLAN: - Cont PT/OT, likely CIR dispo - Cont seroquel    Augusto Blonder, MD [facility]

## 2024-01-04 NOTE — Plan of Care (Signed)

## 2024-01-04 NOTE — Care Management Important Message (Signed)
 Important Message  Patient Details  Name: [name] MRN: [id number] Date of Birth: Dec 13, 1950   Important Message Given:  Yes - Medicare IM     Wynonia Hedges 01/04/2024, 2:14 PM

## 2024-01-05 ENCOUNTER — Encounter (HOSPITAL_COMMUNITY): Payer: Self-pay | Admitting: Neurosurgery

## 2024-01-05 ENCOUNTER — Other Ambulatory Visit (HOSPITAL_COMMUNITY): Payer: Self-pay

## 2024-01-05 NOTE — Progress Notes (Signed)
  NEUROSURGERY PROGRESS NOTE   No issues overnight.   EXAM:  BP 98/62 (BP Location: Right Arm)   Pulse 76   Temp 98.3 F (36.8 C) (Oral)   Resp 18   Ht 5' 9 (1.753 m)   Wt 68 kg   SpO2 99%   BMI 22.15 kg/m   Awake, alert, remains somewhat confused Speech fluent CN grossly intact  5/5 BUE/BLE  Wounds c/d/i  IMPRESSION:  73 y.o. male POD# 5 s/p L2-5 lateral interbody fusion/posterior stabilization. Appears to be ambulating much better than preop. Confusion likely due to underlying dementia and recent GA but appears to be slowly improving.  PLAN: - Cont PT/OT, likely CIR dispo    Augusto Blonder, MD [facility]

## 2024-01-05 NOTE — TOC Progression Note (Signed)
 Transition of Care [facility]) - Progression Note    Patient Details  Name: [name] MRN: [id number] Date of Birth: 04/24/1951  [facility] CM/SW Contact  Jonathan Neighbor, RN Phone Number: 01/05/2024, 1:26 PM  Clinical Narrative:     Pts insurance has denied pt for CIR. Pt and spouse asked that his be appealed. Awaiting appeal decision. TOC following.  Expected Discharge Plan: IP Rehab Facility    Expected Discharge Plan and Services                                               Social Determinants of Health (SDOH) Interventions SDOH Screenings   Food Insecurity: Patient Unable To Answer (12/31/2023)  Housing: Patient Unable To Answer (12/31/2023)  Transportation Needs: Patient Unable To Answer (12/31/2023)  Utilities: Patient Unable To Answer (12/31/2023)  Alcohol Screen: Low Risk  (10/19/2023)  Depression (PHQ2-9): Low Risk  (10/19/2023)  Financial Resource Strain: Low Risk  (10/19/2023)  Physical Activity: Sufficiently Active (10/19/2023)  Social Connections: Patient Unable To Answer (12/31/2023)  Recent Concern: Social Connections - Moderately Isolated (10/19/2023)  Stress: No Stress Concern Present (10/19/2023)  Tobacco Use: Medium Risk (12/31/2023)  Health Literacy: Adequate Health Literacy (10/19/2023)    Readmission Risk Interventions     No data to display

## 2024-01-05 NOTE — Progress Notes (Signed)
 IP rehab admissions - We have received a denial after peer to peer review for inpatient rehab admission.  I spoke with patient and his wife and they would like to appeal the denial decision.  I will begin the appeal process today.  [phone]

## 2024-01-05 NOTE — Progress Notes (Signed)
 Physical Therapy Treatment Patient Details Name: [name] MRN: [id number] DOB: [birth year] Today's Date: 01/05/2024   History of Present Illness Pt is a 73 y.o. male s/p L3-5 prone trans-psoas approach for lateral interbody fusion; R L4-5 facetectomy and posterior segmental instrumentation L3-5. PMH significant for PD, HTN, DM, gout, glaucoma, PVD    PT Comments  Pt tolerated treatment well today. Pt with similar presentation to previous session however required more assistance with bed mobility today compared to yesterday. Pt is still confused. No change in DC/DME recs at this time. PT will continue to follow.     If plan is discharge home, recommend the following: Two people to help with walking and/or transfers;Two people to help with bathing/dressing/bathroom;Assistance with cooking/housework;Assistance with feeding;Direct supervision/assist for medications management;Direct supervision/assist for financial management;Assist for transportation;Help with stairs or ramp for entrance;Supervision due to cognitive status   Can travel by private vehicle        Equipment Recommendations  Wheelchair (measurements PT);Wheelchair cushion (measurements PT);Rolling walker (2 wheels)    Recommendations for Other Services       Precautions / Restrictions Precautions Precautions: Back Precaution Booklet Issued: No Recall of Precautions/Restrictions: Impaired Precaution/Restrictions Comments: no retention of education on precautions Required Braces or Orthoses: Spinal Brace Spinal Brace: Lumbar corset;Applied in sitting position Restrictions Weight Bearing Restrictions Per Provider Order: No Other Position/Activity Restrictions: dependent with brace don/doff     Mobility  Bed Mobility Overal bed mobility: Needs Assistance Bed Mobility: Rolling, Sidelying to Sit Rolling: Min assist Sidelying to sit: Min assist       General bed mobility comments: continual multimodal cues. Good  adherence to back precautions. Increased time    Transfers Overall transfer level: Needs assistance Equipment used: Rolling walker (2 wheels) Transfers: Sit to/from Stand Sit to Stand: Mod assist           General transfer comment: cues for sequencing, increased time    Ambulation/Gait Ambulation/Gait assistance: Contact guard assist, Min assist, +2 safety/equipment Gait Distance (Feet): 150 Feet Assistive device: Rolling walker (2 wheels) Gait Pattern/deviations: Step-through pattern, Decreased stride length Gait velocity: decreased     General Gait Details: Cues for sequencing and precautions. Chair follow provided however ultimately not needed. Min A for obstacle navigation.   Stairs             Wheelchair Mobility     Tilt Bed    Modified Rankin (Stroke Patients Only)       Balance Overall balance assessment: Needs assistance Sitting-balance support: Single extremity supported, Feet supported Sitting balance-Leahy Scale: Fair Sitting balance - Comments: CGA statically   Standing balance support: Bilateral upper extremity supported, Reliant on assistive device for balance, During functional activity Standing balance-Leahy Scale: Poor                              Communication Communication Communication: No apparent difficulties  Cognition Arousal: Alert Behavior During Therapy: Flat affect   PT - Cognitive impairments: History of cognitive impairments, Orientation, Awareness, Memory, Attention, Initiation, Sequencing, Problem solving, Safety/Judgement   Orientation impairments: Place, Time, Situation                   PT - Cognition Comments: Pt with h/o poor tolerance of anesthesia. Pt states we are at church when asked where we are. Following commands: Impaired Following commands impaired: Follows one step commands with increased time, Follows one step commands inconsistently    Cueing  Cueing Techniques: Verbal cues,  Tactile cues, Gestural cues  Exercises      General Comments        Pertinent Vitals/Pain Pain Assessment Pain Assessment: Faces Faces Pain Scale: Hurts little more Pain Location: Back Pain Descriptors / Indicators: Grimacing, Discomfort Pain Intervention(s): Monitored during session, Limited activity within patient's tolerance    Home Living                          Prior Function            PT Goals (current goals can now be found in the care plan section) Progress towards PT goals: Progressing toward goals    Frequency    Min 5X/week      PT Plan      Co-evaluation              AM-PAC PT 6 Clicks Mobility   Outcome Measure  Help needed turning from your back to your side while in a flat bed without using bedrails?: A Lot Help needed moving from lying on your back to sitting on the side of a flat bed without using bedrails?: A Lot Help needed moving to and from a bed to a chair (including a wheelchair)?: A Lot Help needed standing up from a chair using your arms (e.g., wheelchair or bedside chair)?: A Lot Help needed to walk in hospital room?: A Lot Help needed climbing 3-5 steps with a railing? : Total 6 Click Score: 11    End of Session Equipment Utilized During Treatment: Back brace Activity Tolerance: Patient tolerated treatment well Patient left: in chair;with call bell/phone within reach;with chair alarm set Nurse Communication: Mobility status PT Visit Diagnosis: Unsteadiness on feet (R26.81);Other abnormalities of gait and mobility (R26.89);Muscle weakness (generalized) (M62.81);Pain     Time: 2130-8657 PT Time Calculation (min) (ACUTE ONLY): 15 min  Charges:    $Gait Training: 8-22 mins PT General Charges $$ ACUTE PT VISIT: 1 Visit                     Elverda Wendel B, PT, DPT Acute Rehab Services [phone]    Junice Fei 01/05/2024, 4:30 PM

## 2024-01-06 DIAGNOSIS — E440 Moderate protein-calorie malnutrition: Secondary | ICD-10-CM | POA: Insufficient documentation

## 2024-01-06 NOTE — NC FL2 (Signed)
 [place]  MEDICAID FL2 LEVEL OF CARE FORM     IDENTIFICATION  Patient Name: [name] Birthdate: December 25, 1950 Sex: male Admission Date (Current Location): 12/31/2023  [facility] [place] Number:  Producer, television/film/video and Address:  [facility], [address], [place], [place] [address]      Provider Number: [id number]  Attending Physician Name and Address:  Augusto Blonder, MD  Relative Name and Phone Number:       Current Level of Care: Hospital Recommended Level of Care: Skilled Nursing Facility Prior Approval Number:    Date Approved/Denied:   PASRR Number: Manual review  Discharge Plan: SNF    Current Diagnoses: Patient Active Problem List   Diagnosis Date Noted   Malnutrition of moderate degree 01/06/2024   Spondylolisthesis at L4-L5 level 12/31/2023   Supine hypertension 12/03/2023   Parkinson's disease with neurogenic orthostatic hypotension (HCC) 12/03/2023   Preop cardiovascular exam 12/03/2023   Glaucoma 11/30/2023   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   Ventral hernia without obstruction or gangrene    Primary open angle glaucoma of left eye, mild stage 06/23/2021   Facial numbness 12/24/2020   Neovascular glaucoma, right eye, stage unspecified 12/16/2020   Lumbar radiculopathy 11/25/2020   Gastroesophageal reflux disease 09/10/2020   Dry eyes, bilateral 08/15/2020   Presbycusis of right ear 01/17/2020   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   Secondary corneal edema of right eye 12/12/2019   Ptosis of right eyelid 12/12/2019   OAB (overactive bladder) 11/29/2019   Bunion of great toe of right foot 07/14/2017   Claw toe, acquired, right 07/14/2017   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   Medication management 10/09/2016   Plantar fasciitis of right foot 02/19/2015   Depression 08/14/2014   Family history of heart disease in male family member  before age 74 04/26/2014   Mild cognitive impairment 03/13/2014   Parkinsonian tremor (HCC) 02/12/2014   Hyperlipidemia with target LDL less than 100    Abdominal aortic atherosclerosis (HCC)    Moderate essential hypertension 02/25/2011   Arthropathy 02/25/2011   HAV (hallux abducto valgus) 02/25/2011    Orientation RESPIRATION BLADDER Height & Weight     Self, Time, Place  Normal Incontinent Weight: 150 lb (68 kg) Height:  5' 9 (175.3 cm)  BEHAVIORAL SYMPTOMS/MOOD NEUROLOGICAL BOWEL NUTRITION STATUS      Continent Diet (Regular)  AMBULATORY STATUS COMMUNICATION OF NEEDS Skin   Limited Assist Verbally Surgical wounds (closed back, honeycomb dressing)                       Personal Care Assistance Level of Assistance  Bathing, Feeding, Dressing Bathing Assistance: Limited assistance Feeding assistance: Limited assistance Dressing Assistance: Limited assistance     Functional Limitations Info  Sight Sight Info: Impaired        SPECIAL CARE FACTORS FREQUENCY  PT (By licensed PT), OT (By licensed OT)     PT Frequency: 5x/wk OT Frequency: 5x/wk            Contractures Contractures Info: Not present    Additional Factors Info  Code Status, Allergies, Psychotropic Code Status Info: Full Allergies Info: Anesthesia S-i-40 (Propofol ), Aspirin , Haldol  (Haloperidol ), Other Psychotropic Info: Sinemet  1.5 tabs 3x/day; Celexa  20mg  at bed; Seroquel  25mg  every morning; Seroquel  50mg  at bed         Current Medications (01/06/2024):  This is the current hospital active medication  list Current Facility-Administered Medications  Medication Dose Route Frequency Provider Last Rate Last Admin   acetaminophen  (TYLENOL ) tablet 650 mg  650 mg Oral Q4H PRN Augusto Blonder, MD   650 mg at 01/05/24 1609   Or   acetaminophen  (TYLENOL ) suppository 650 mg  650 mg Rectal Q4H PRN Augusto Blonder, MD       alfuzosin  (UROXATRAL ) 24 hr tablet 10 mg  10 mg Oral Daily Nundkumar,  Neelesh, MD   10 mg at 01/06/24 0805   atorvastatin  (LIPITOR) tablet 20 mg  20 mg Oral Daily Nundkumar, Neelesh, MD   20 mg at 01/06/24 0805   bisacodyl  (DULCOLAX) suppository 10 mg  10 mg Rectal Daily PRN Augusto Blonder, MD       carbidopa -levodopa  (SINEMET  IR) 25-100 MG per tablet immediate release 1.5 tablet  1.5 tablet Oral TID Nundkumar, Neelesh, MD   1.5 tablet at 01/06/24 0805   citalopram  (CELEXA ) tablet 20 mg  20 mg Oral QHS Nundkumar, Neelesh, MD   20 mg at 01/05/24 2203   docusate sodium  (COLACE) capsule 100 mg  100 mg Oral BID Nundkumar, Neelesh, MD   100 mg at 01/06/24 0805   dorzolamide -timolol  (COSOPT ) 2-0.5 % ophthalmic solution 1 drop  1 drop Both Eyes BID Augusto Blonder, MD   1 drop at 01/06/24 0810   enoxaparin (LOVENOX) injection 40 mg  40 mg Subcutaneous Q24H Tomlinson, Sara Caylin, PA-C   40 mg at 01/06/24 0809   feeding supplement (ENSURE PLUS HIGH PROTEIN) liquid 237 mL  237 mL Oral BID BM Nundkumar, Neelesh, MD   237 mL at 01/06/24 1001   hydrALAZINE  (APRESOLINE ) injection 10 mg  10 mg Intravenous Q4H PRN Bergman, Meghan D, NP   10 mg at 01/05/24 0500   irbesartan  (AVAPRO ) tablet 75 mg  75 mg Oral q morning Augusto Blonder, MD   75 mg at 01/06/24 0805   menthol -cetylpyridinium (CEPACOL) lozenge 3 mg  1 lozenge Oral PRN Nundkumar, Neelesh, MD       Or   phenol (CHLORASEPTIC) mouth spray 1 spray  1 spray Mouth/Throat PRN Augusto Blonder, MD       methocarbamol  (ROBAXIN ) tablet 500 mg  500 mg Oral Q6H PRN Augusto Blonder, MD   500 mg at 01/05/24 1609   Or   methocarbamol  (ROBAXIN ) injection 500 mg  500 mg Intravenous Q6H PRN Augusto Blonder, MD       morphine  (PF) 2 MG/ML injection 2 mg  2 mg Intravenous Q2H PRN Augusto Blonder, MD   2 mg at 01/04/24 2226   multivitamin with minerals tablet 1 tablet  1 tablet Oral Daily Augusto Blonder, MD   1 tablet at 01/06/24 0805   Netarsudil -Latanoprost  0.02-0.005 % SOLN 1 drop  1 drop Left Eye QHS Pham, [place] Q,  RPH-CPP   1 drop at 01/03/24 2114   nitroGLYCERIN  (NITROSTAT ) SL tablet 0.4 mg  0.4 mg Sublingual Q5 min PRN Augusto Blonder, MD       ondansetron  (ZOFRAN ) tablet 4 mg  4 mg Oral Q6H PRN Augusto Blonder, MD       Or   ondansetron  (ZOFRAN ) injection 4 mg  4 mg Intravenous Q6H PRN Augusto Blonder, MD       oxyCODONE  (Oxy IR/ROXICODONE ) immediate release tablet 10 mg  10 mg Oral Q3H PRN Augusto Blonder, MD   10 mg at 01/01/24 2150   oxyCODONE  (Oxy IR/ROXICODONE ) immediate release tablet 5 mg  5 mg Oral Q3H PRN Augusto Blonder, MD  pantoprazole  (PROTONIX ) EC tablet 40 mg  40 mg Oral BID Nundkumar, Neelesh, MD   40 mg at 01/06/24 7829   QUEtiapine  (SEROQUEL ) tablet 25 mg  25 mg Oral q morning Bergman, Meghan D, NP   25 mg at 01/06/24 0805   QUEtiapine  (SEROQUEL ) tablet 50 mg  50 mg Oral QHS Bergman, Meghan D, NP   50 mg at 01/05/24 2204   rivastigmine  (EXELON ) capsule 4.5 mg  4.5 mg Oral BID Nundkumar, Neelesh, MD   4.5 mg at 01/06/24 0805   senna (SENOKOT) tablet 8.6 mg  1 tablet Oral BID Augusto Blonder, MD   8.6 mg at 01/06/24 0805   sodium chloride  flush (NS) 0.9 % injection 3 mL  3 mL Intravenous Q12H Augusto Blonder, MD   3 mL at 01/06/24 0810   sodium chloride  flush (NS) 0.9 % injection 3 mL  3 mL Intravenous PRN Augusto Blonder, MD       trihexyphenidyl  (ARTANE ) tablet 1 mg  1 mg Oral BID WC Nundkumar, Neelesh, MD   1 mg at 01/06/24 0805     Discharge Medications: Please see discharge summary for a list of discharge medications.  Relevant Imaging Results:  Relevant Lab Results:   Additional Information SS#: [id number]  Tandy Fam, LCSW

## 2024-01-06 NOTE — TOC PASRR Note (Signed)
 30 Day PASRR Note   Patient Details  Name: DEAGLAN LILE Date of Birth: 1950-08-13   Transition of Care [facility]) CM/SW Contact:    Tandy Fam, LCSW Phone Number: 01/06/2024, 2:11 PM  To Whom It May Concern:  Please be advised that this patient will require a short-term nursing home stay - anticipated 30 days or less for rehabilitation and strengthening.   The plan is for return home.  TOC Dementia Note   Patient Details  Name: RENOLD KOZAR Date of Birth: 1951-03-15 01/06/2024, 2:12 PM   To Whom It May Concern:  Please be advised that the above-named patient has a primary diagnosis of dementia which supersedes any psychiatric diagnosis.   Transition of Care [facility]) CM/SW Contact: Tandy Fam, LCSW Phone Number: 01/06/2024, 2:12 PM

## 2024-01-06 NOTE — Progress Notes (Signed)
    Providing Compassionate, Quality Care - Together   NEUROSURGERY PROGRESS NOTE     S: No issues overnight.    O: EXAM:  BP (!) 170/93 (BP Location: Right Arm)   Pulse 75   Temp 98.6 F (37 C) (Oral)   Resp 16   Ht 5' 9 (1.753 m)   Wt 68 kg   SpO2 100%   BMI 22.15 kg/m     Awake, alert, oriented x4 Speech fluent, appropriate  CNs grossly intact  BUE/BLE 5/5 SILTx4 Incisions c/d/i   ASSESSMENT:  73 y.o. s/p L2-5 lateral lumbar fusion with improving confusion assumed to be related to recent GA and underlying PD.     PLAN: -Continue therapies as tolerated -CIR appeal pending -Call w/ questions/concerns.   Easter Golden, [facility]

## 2024-01-06 NOTE — Progress Notes (Signed)
 Inpatient Rehab Admissions Coordinator:  Awaiting decision regarding expedited appeal. Will continue to follow.   Artemus Larsen, MS, CCC-SLP Admissions Coordinator [phone]

## 2024-01-06 NOTE — Progress Notes (Signed)
 Physical Therapy Treatment Patient Details Name: [name] MRN: [id number] DOB: 07-26-1951 Today's Date: 01/06/2024   History of Present Illness Pt is a 73 y.o. male s/p L3-5 prone trans-psoas approach for lateral interbody fusion; R L4-5 facetectomy and posterior segmental instrumentation L3-5. PMH significant for PD, HTN, DM, gout, glaucoma, PVD    PT Comments  Pt tolerated treatment well today. Pt with similar presentation to previous session. Pt able to progress ambulation distance in hallway however required a bit more assistance due to 1 lateral LOB. Pt cognition appears to be about the same as previous sessions. No change in DC/DME recs at this time. PT will continue to follow.     If plan is discharge home, recommend the following: Two people to help with walking and/or transfers;Two people to help with bathing/dressing/bathroom;Assistance with cooking/housework;Assistance with feeding;Direct supervision/assist for medications management;Direct supervision/assist for financial management;Assist for transportation;Help with stairs or ramp for entrance;Supervision due to cognitive status   Can travel by private vehicle        Equipment Recommendations  Wheelchair (measurements PT);Wheelchair cushion (measurements PT);Rolling walker (2 wheels)    Recommendations for Other Services       Precautions / Restrictions Precautions Precautions: Back Precaution Booklet Issued: No Recall of Precautions/Restrictions: Impaired Precaution/Restrictions Comments: no retention of education on precautions Required Braces or Orthoses: Spinal Brace Spinal Brace: Lumbar corset;Applied in sitting position Restrictions Weight Bearing Restrictions Per Provider Order: No Other Position/Activity Restrictions: dependent with brace don/doff     Mobility  Bed Mobility Overal bed mobility: Needs Assistance Bed Mobility: Rolling, Sidelying to Sit Rolling: Mod assist Sidelying to sit: Min  assist       General bed mobility comments: continual multimodal cues. Good adherence to back precautions. Increased time    Transfers Overall transfer level: Needs assistance Equipment used: Rolling walker (2 wheels) Transfers: Sit to/from Stand Sit to Stand: Mod assist           General transfer comment: cues for sequencing, increased time    Ambulation/Gait Ambulation/Gait assistance: Min assist, +2 safety/equipment, Mod assist Gait Distance (Feet): 200 Feet Assistive device: Rolling walker (2 wheels) Gait Pattern/deviations: Step-through pattern, Decreased stride length Gait velocity: decreased     General Gait Details: Cues for sequencing and precautions. Chair follow provided however ultimately not needed. Min A for obstacle navigation. 1 lateral LOB noted requiring Mod A to correct.   Stairs             Wheelchair Mobility     Tilt Bed    Modified Rankin (Stroke Patients Only)       Balance Overall balance assessment: Needs assistance Sitting-balance support: Single extremity supported, Feet supported Sitting balance-Leahy Scale: Fair Sitting balance - Comments: CGA statically   Standing balance support: Bilateral upper extremity supported, Reliant on assistive device for balance, During functional activity Standing balance-Leahy Scale: Poor                              Communication Communication Communication: No apparent difficulties  Cognition Arousal: Alert Behavior During Therapy: Flat affect   PT - Cognitive impairments: History of cognitive impairments, Orientation, Awareness, Memory, Attention, Initiation, Sequencing, Problem solving, Safety/Judgement   Orientation impairments: Place, Time, Situation                   PT - Cognition Comments: Pt with h/o poor tolerance of anesthesia. Pt states we are at church when asked where we  are. Following commands: Impaired Following commands impaired: Follows one step  commands with increased time, Follows one step commands inconsistently    Cueing Cueing Techniques: Verbal cues, Tactile cues, Gestural cues  Exercises      General Comments General comments (skin integrity, edema, etc.): VSS      Pertinent Vitals/Pain Pain Assessment Pain Assessment: Faces Faces Pain Scale: Hurts a little bit Pain Location: Back Pain Descriptors / Indicators: Grimacing, Discomfort Pain Intervention(s): Monitored during session    Home Living                          Prior Function            PT Goals (current goals can now be found in the care plan section) Progress towards PT goals: Progressing toward goals    Frequency    Min 5X/week      PT Plan      Co-evaluation              AM-PAC PT 6 Clicks Mobility   Outcome Measure  Help needed turning from your back to your side while in a flat bed without using bedrails?: A Lot Help needed moving from lying on your back to sitting on the side of a flat bed without using bedrails?: A Lot Help needed moving to and from a bed to a chair (including a wheelchair)?: A Lot Help needed standing up from a chair using your arms (e.g., wheelchair or bedside chair)?: A Lot Help needed to walk in hospital room?: A Lot Help needed climbing 3-5 steps with a railing? : Total 6 Click Score: 11    End of Session Equipment Utilized During Treatment: Back brace Activity Tolerance: Patient tolerated treatment well Patient left: in chair;with call bell/phone within reach;with chair alarm set Nurse Communication: Mobility status PT Visit Diagnosis: Unsteadiness on feet (R26.81);Other abnormalities of gait and mobility (R26.89);Muscle weakness (generalized) (M62.81);Pain     Time: 9629-5284 PT Time Calculation (min) (ACUTE ONLY): 21 min  Charges:    $Gait Training: 8-22 mins PT General Charges $$ ACUTE PT VISIT: 1 Visit                     [name], PT, DPT Acute Rehab Services [phone]     Samentha Perham 01/06/2024, 12:23 PM

## 2024-01-06 NOTE — TOC Progression Note (Addendum)
 Transition of Care [facility]) - Progression Note    Patient Details  Name: [name] MRN: [id number] Date of Birth: Jun 10, 1951  [facility]) CM/SW Contact  Tandy Fam, [place] Phone Number: 01/06/2024, 2:39 PM  Clinical Narrative:   Patient's spouse agreeable to SNF if insurance continues to deny CIR after appeal. CSW completed referral and faxed out, will provide bed offers to spouse for review. CSW to follow.  UPDATE: CSW spoke with spouse, Felipa Horsfall, to discuss SNF offers. Felipa Horsfall asked CSW to contact [facility], as referral is still pending. CSW contacted [facility] to ask them to review referral. CSW met with patient and provided printed out list of SNF offers, left in patient's belongings bag in the closet for spouse to retrieve in the morning. CSW to follow.    Expected Discharge Plan: IP Rehab Facility    Expected Discharge Plan and Services                                               Social Determinants of Health (SDOH) Interventions SDOH Screenings   Food Insecurity: Patient Unable To Answer (12/31/2023)  Housing: Patient Unable To Answer (12/31/2023)  Transportation Needs: Patient Unable To Answer (12/31/2023)  Utilities: Patient Unable To Answer (12/31/2023)  Alcohol Screen: Low Risk  (10/19/2023)  Depression (PHQ2-9): Low Risk  (10/19/2023)  Financial Resource Strain: Low Risk  (10/19/2023)  Physical Activity: Sufficiently Active (10/19/2023)  Social Connections: Patient Unable To Answer (12/31/2023)  Recent Concern: Social Connections - Moderately Isolated (10/19/2023)  Stress: No Stress Concern Present (10/19/2023)  Tobacco Use: Medium Risk (12/31/2023)  Health Literacy: Adequate Health Literacy (10/19/2023)    Readmission Risk Interventions     No data to display

## 2024-01-07 ENCOUNTER — Encounter (HOSPITAL_COMMUNITY): Payer: Self-pay | Admitting: Neurosurgery

## 2024-01-07 MED ORDER — NETARSUDIL-LATANOPROST 0.02-0.005 % OP SOLN: 1.0000 [drp] | Freq: Every day | OPHTHALMIC | Status: DC

## 2024-01-07 NOTE — Progress Notes (Signed)
 Pt. Family requesting that staff attempt to not give patient oxycodone  if possible.

## 2024-01-07 NOTE — Progress Notes (Signed)
 Physical Therapy Treatment Patient Details Name: [name] MRN: [id number] DOB: Dec 12, 1950 Today's Date: 01/07/2024   History of Present Illness Pt is a 73 y.o. male s/p L3-5 prone trans-psoas approach for lateral interbody fusion; R L4-5 facetectomy and posterior segmental instrumentation L3-5. PMH significant for PD, HTN, DM, gout, glaucoma, PVD    PT Comments  Pt is progressing well towards goals. Currently pt is Min A for sit to stand and gait with RW. Pt requires cueing for safety and sequencing as well as improved gait pattern and physical assist with initiating sitting from standing 1x during session. Pt family was present and supportive. Due to pt current functional status, home set up and available assistance at home recommending skilled physical therapy services < 3 hours/day in order to address strength, balance and functional mobility to decrease risk for falls, injury, immobility, skin break down and re-hospitalization.      If plan is discharge home, recommend the following: Two people to help with walking and/or transfers;Assistance with cooking/housework;Assist for transportation;Help with stairs or ramp for entrance;Supervision due to cognitive status   Can travel by private vehicle     No  Equipment Recommendations  Rolling walker (2 wheels);BSC/3in1    Recommendations for Other Services Rehab consult     Precautions / Restrictions Precautions Precautions: Back Precaution Booklet Issued: No Recall of Precautions/Restrictions: Impaired Precaution/Restrictions Comments: no retention of education on precautions Required Braces or Orthoses: Spinal Brace Spinal Brace: Lumbar corset;Applied in sitting position Restrictions Weight Bearing Restrictions Per Provider Order: No Other Position/Activity Restrictions: dependent with brace don/doff     Mobility  Bed Mobility   General bed mobility comments: Pt in recliner at beginning and end of session     Transfers Overall transfer level: Needs assistance Equipment used: Rolling walker (2 wheels) Transfers: Sit to/from Stand Sit to Stand: Min assist           General transfer comment: Min a with verbal multi modal cues for safe sequencing with hand placement and body mechanics to get to standing from sitting. Pt did require minimal physical assist at hip flexors to initiate sitting from standing due to freezing.    Ambulation/Gait Ambulation/Gait assistance: Min assist Gait Distance (Feet): 200 Feet Assistive device: Rolling walker (2 wheels) Gait Pattern/deviations: Step-through pattern, Decreased stride length, Decreased step length - left Gait velocity: decreased Gait velocity interpretation: <1.31 ft/sec, indicative of household ambulator   General Gait Details: Pt has a short step on LLE with very low foot clearance. Verbal cues throughout for big steps with good follow through lasting ~ 1 min before requiring another verbal cue       Balance Overall balance assessment: Needs assistance Sitting-balance support: Single extremity supported, Feet supported Sitting balance-Leahy Scale: Fair     Standing balance support: Bilateral upper extremity supported, Reliant on assistive device for balance, During functional activity Standing balance-Leahy Scale: Fair      Hotel manager: No apparent difficulties  Cognition Arousal: Alert Behavior During Therapy: Flat affect   PT - Cognitive impairments: History of cognitive impairments, Orientation, Awareness, Memory, Attention, Problem solving, Safety/Judgement   Orientation impairments: Place, Time, Situation       PT - Cognition Comments: Pt continues to state he is at home at beginning of session. States that he is at [place] by end of session Following commands: Impaired Following commands impaired: Follows one step commands with increased time, Follows multi-step commands  inconsistently    Cueing Cueing Techniques: Verbal cues, Tactile  cues, Gestural cues     General Comments General comments (skin integrity, edema, etc.): no signs/symptoms of cardiac/respiratory distress during session      Pertinent Vitals/Pain Pain Assessment Pain Assessment: 0-10 Pain Score: 6  Pain Location: Back Pain Descriptors / Indicators: Discomfort, Aching Pain Intervention(s): Monitored during session, Limited activity within patient's tolerance    PT Goals (current goals can now be found in the care plan section) Acute Rehab PT Goals Patient Stated Goal: not stated PT Goal Formulation: With patient Time For Goal Achievement: 01/15/24 Potential to Achieve Goals: Good Progress towards PT goals: Progressing toward goals    Frequency    Min 5X/week      PT Plan  Continue with current POC        AM-PAC PT 6 Clicks Mobility   Outcome Measure  Help needed turning from your back to your side while in a flat bed without using bedrails?: A Lot Help needed moving from lying on your back to sitting on the side of a flat bed without using bedrails?: A Lot Help needed moving to and from a bed to a chair (including a wheelchair)?: A Little Help needed standing up from a chair using your arms (e.g., wheelchair or bedside chair)?: A Little Help needed to walk in hospital room?: A Little Help needed climbing 3-5 steps with a railing? : A Lot 6 Click Score: 15    End of Session Equipment Utilized During Treatment: Back brace;Gait belt Activity Tolerance: Patient tolerated treatment well Patient left: in chair;with chair alarm set;with family/visitor present;with call bell/phone within reach Nurse Communication: Mobility status PT Visit Diagnosis: Unsteadiness on feet (R26.81);Other abnormalities of gait and mobility (R26.89);Muscle weakness (generalized) (M62.81);Pain Pain - Right/Left:  (central) Pain - part of body:  (low back)     Time: 4540-9811 PT Time  Calculation (min) (ACUTE ONLY): 27 min  Charges:    $Gait Training: 8-22 mins $Therapeutic Activity: 8-22 mins PT General Charges $$ ACUTE PT VISIT: 1 Visit                     [name], DPT, CLT  Acute Rehabilitation Services Office: [phone] (Secure chat preferred)    [name] 01/07/2024, 2:03 PM

## 2024-01-07 NOTE — TOC Progression Note (Signed)
 Transition of Care [facility]) - Progression Note    Patient Details  Name: [name] MRN: [id number] Date of Birth: February 01, 1951  Transition of Care [facility]) CM/SW Contact  Tandy Fam, [place] Phone Number: 01/07/2024, 3:25 PM  Clinical Narrative:   CSW uploaded patient's PASRR documentation to [facility] for review. CSW met with patient's spouse, Felipa Horsfall, to discuss SNF options and answer questions. Felipa Horsfall hopeful that patient's insurance will approve CIR, but understands that it may not approve. [name] leaning towards [facility] for SNF, would like time to review options before making final decision. CSW discussed with [facility], they would not have a bed available until Monday. Patient will need to be without telesitter prior to DC to SNF. CSW to follow.    Expected Discharge Plan: Skilled Nursing Facility Barriers to Discharge: Continued Medical Work up, English as a second language teacher, Engineer, mining)  Expected Discharge Plan and Services                                               Social Determinants of Health (SDOH) Interventions SDOH Screenings   Food Insecurity: Patient Unable To Answer (12/31/2023)  Housing: Patient Unable To Answer (12/31/2023)  Transportation Needs: Patient Unable To Answer (12/31/2023)  Utilities: Patient Unable To Answer (12/31/2023)  Alcohol Screen: Low Risk  (10/19/2023)  Depression (PHQ2-9): Low Risk  (10/19/2023)  Financial Resource Strain: Low Risk  (10/19/2023)  Physical Activity: Sufficiently Active (10/19/2023)  Social Connections: Patient Unable To Answer (12/31/2023)  Recent Concern: Social Connections - Moderately Isolated (10/19/2023)  Stress: No Stress Concern Present (10/19/2023)  Tobacco Use: Medium Risk (12/31/2023)  Health Literacy: Adequate Health Literacy (10/19/2023)    Readmission Risk Interventions     No data to display

## 2024-01-07 NOTE — Progress Notes (Signed)
    Providing Compassionate, Quality Care - Together   NEUROSURGERY PROGRESS NOTE     S: No issues overnight.    O: EXAM:  BP (!) 194/117 (BP Location: Right Arm)   Pulse 83   Temp 97.8 F (36.6 C) (Oral)   Resp 19   Ht 5' 9 (1.753 m)   Wt 68 kg   SpO2 100%   BMI 22.15 kg/m     Awake, alert, oriented  Speech fluent, appropriate  CNs grossly intact  BUE/BLE 5/5 SILTx4 Dressings c/d/i   ASSESSMENT:  73 y.o. s/p L2-5 lateral lumbar fusion with improving confusion assumed to be related to recent GA and underlying PD.     PLAN: -Continue therapies as tolerated -CIR appeal pending, SNF if denied -Call w/ questions/concerns.   [place], [facility]

## 2024-01-07 NOTE — Progress Notes (Signed)
 Physical Therapy Treatment Patient Details Name: [name] MRN: [id number] DOB: Dec 18, 1950 Today's Date: 01/07/2024   History of Present Illness Pt is a 73 y.o. male s/p L3-5 prone trans-psoas approach for lateral interbody fusion; R L4-5 facetectomy and posterior segmental instrumentation L3-5. PMH significant for PD, HTN, DM, gout, glaucoma, PVD    PT Comments  Pt with poor tolerance to treatment today. Pt limited by cognitive deficits and pain today adamantly refusing ambulation despite max encouragement and reassurance. Pt repeatedly saying I don't think I can do it today, I'm disoriented and I can't go out there in this kind of shape.  After much deliberation pt able to sit EOB with +2 Max and take sidesteps along EOB with +2 Mod A. No change in DC/DME recs at this time. PT will continue to follow.    If plan is discharge home, recommend the following: Two people to help with walking and/or transfers;Two people to help with bathing/dressing/bathroom;Assistance with cooking/housework;Assistance with feeding;Direct supervision/assist for medications management;Direct supervision/assist for financial management;Assist for transportation;Help with stairs or ramp for entrance;Supervision due to cognitive status   Can travel by private vehicle        Equipment Recommendations  Wheelchair (measurements PT);Wheelchair cushion (measurements PT);Rolling walker (2 wheels)    Recommendations for Other Services Rehab consult     Precautions / Restrictions Precautions Precautions: Back Precaution Booklet Issued: No Recall of Precautions/Restrictions: Impaired Precaution/Restrictions Comments: no retention of education on precautions Required Braces or Orthoses: Spinal Brace Spinal Brace: Lumbar corset;Applied in sitting position Restrictions Weight Bearing Restrictions Per Provider Order: No     Mobility  Bed Mobility Overal bed mobility: Needs Assistance Bed Mobility: Supine to  Sit, Sit to Supine     Supine to sit: +2 for physical assistance, Max assist Sit to supine: +2 for physical assistance, Max assist   General bed mobility comments: +2 Max A via helicopter due to poor command following.    Transfers Overall transfer level: Needs assistance Equipment used: Rolling walker (2 wheels) Transfers: Sit to/from Stand Sit to Stand: +2 physical assistance, Mod assist           General transfer comment: +2 Mod A to stand and take sidesteps along EOB. Pt adamantly refusing further mobility    Ambulation/Gait               General Gait Details: Pt refusing   Stairs             Wheelchair Mobility     Tilt Bed    Modified Rankin (Stroke Patients Only)       Balance Overall balance assessment: Needs assistance Sitting-balance support: Single extremity supported, Feet supported Sitting balance-Leahy Scale: Fair Sitting balance - Comments: CGA statically   Standing balance support: Bilateral upper extremity supported, Reliant on assistive device for balance, During functional activity Standing balance-Leahy Scale: Poor Standing balance comment: reliant on therapist                            Communication Communication Communication: No apparent difficulties  Cognition Arousal: Alert Behavior During Therapy: Flat affect   PT - Cognitive impairments: History of cognitive impairments, Orientation, Awareness, Memory, Attention, Initiation, Sequencing, Problem solving, Safety/Judgement   Orientation impairments: Place, Time, Situation                   PT - Cognition Comments: Pt with h/o poor tolerance of anesthesia. Pt states we are at his  home when asked where we are. pt aware that he is confused. Following commands: Impaired Following commands impaired: Follows one step commands with increased time, Follows one step commands inconsistently    Cueing Cueing Techniques: Verbal cues, Tactile cues, Gestural  cues  Exercises      General Comments General comments (skin integrity, edema, etc.): VSS      Pertinent Vitals/Pain Pain Assessment Pain Assessment: Faces Faces Pain Scale: Hurts little more Pain Location: Back Pain Descriptors / Indicators: Grimacing, Discomfort, Moaning Pain Intervention(s): Monitored during session, Limited activity within patient's tolerance    Home Living                          Prior Function            PT Goals (current goals can now be found in the care plan section) Progress towards PT goals: Not progressing toward goals - comment    Frequency    Min 5X/week      PT Plan      Co-evaluation              AM-PAC PT 6 Clicks Mobility   Outcome Measure  Help needed turning from your back to your side while in a flat bed without using bedrails?: A Lot Help needed moving from lying on your back to sitting on the side of a flat bed without using bedrails?: A Lot Help needed moving to and from a bed to a chair (including a wheelchair)?: A Lot Help needed standing up from a chair using your arms (e.g., wheelchair or bedside chair)?: A Lot Help needed to walk in hospital room?: A Lot Help needed climbing 3-5 steps with a railing? : Total 6 Click Score: 11    End of Session   Activity Tolerance: Treatment limited secondary to agitation;Patient limited by lethargy Patient left: in bed;with call bell/phone within reach;with bed alarm set Nurse Communication: Mobility status PT Visit Diagnosis: Unsteadiness on feet (R26.81);Other abnormalities of gait and mobility (R26.89);Muscle weakness (generalized) (M62.81);Pain     Time: 1610-9604 PT Time Calculation (min) (ACUTE ONLY): 19 min  Charges:    $Therapeutic Activity: 8-22 mins PT General Charges $$ ACUTE PT VISIT: 1 Visit                     [name] B, PT, DPT Acute Rehab Services [phone]    [name] 01/07/2024, 1:35 PM

## 2024-01-07 NOTE — Progress Notes (Addendum)
 Inpatient Rehab Admissions Coordinator:  Await decision regarding expedited appeal. Will continue to follow.  Insurance denied expedited appeal. Pt and pt's wife made aware. TOC aware.    Artemus Larsen, MS, CCC-SLP Admissions Coordinator [phone]

## 2024-01-07 NOTE — Plan of Care (Signed)
   Problem: Activity: Goal: Risk for activity intolerance will decrease Outcome: Progressing

## 2024-01-08 MED ORDER — IBUPROFEN 200 MG PO TABS
400.0000 mg | ORAL_TABLET | Freq: Four times a day (QID) | ORAL | Status: DC | PRN
  Administered 2024-01-08 – 2024-01-12 (×7): 400 mg via ORAL
  Filled 2024-01-08 (×7): qty 2

## 2024-01-08 NOTE — Plan of Care (Signed)
 Pt wife states to only give Tylenol  and avoid giving oxycodone  when in pain. She also reiterates not to give Robaxin  as prn meds. Problem: Health Behavior/Discharge Planning: Goal: Ability to manage health-related needs will improve Outcome: Progressing   Problem: Clinical Measurements: Goal: Will remain free from infection Outcome: Progressing   Problem: Activity: Goal: Risk for activity intolerance will decrease Outcome: Progressing   Problem: Nutrition: Goal: Adequate nutrition will be maintained Outcome: Progressing   Problem: Coping: Goal: Level of anxiety will decrease Outcome: Progressing   Problem: Safety: Goal: Ability to remain free from injury will improve Outcome: Progressing   Problem: Skin Integrity: Goal: Risk for impaired skin integrity will decrease Outcome: Progressing

## 2024-01-08 NOTE — Plan of Care (Signed)
  Problem: Education: Goal: Knowledge of General Education information will improve Description: Including pain rating scale, medication(s)/side effects and non-pharmacologic comfort measures Outcome: Not Progressing   Problem: Health Behavior/Discharge Planning: Goal: Ability to manage health-related needs will improve Outcome: Not Progressing   Problem: Clinical Measurements: Goal: Ability to maintain clinical measurements within normal limits will improve Outcome: Not Progressing  Patient is confused

## 2024-01-08 NOTE — Progress Notes (Signed)
 Patient's spouse has a lot of unanswered questions and concerns for the patient that need to speak with the neurosurgery team. She verbalized that she is not happy about the patient's confusion and have not seen the doctor. RN called neurosurgery office and left message with Lonnice for on call MD to return call.

## 2024-01-08 NOTE — Progress Notes (Signed)
 Patient ID: [name], male   DOB: 10/03/1950, 73 y.o.   MRN: [id number] Patient's wife is visiting with patient today and had a number of concerns and was somewhat 3 by patient's care.  She states he has parkinsonism does not do well with narcotics after surgery she has been trying to relay this information to the nurses and doctors without much success.  The patient's wife is a retired Engineer, civil (consulting) of 45 years experience with the last 12 years being in psych nursing.  Notes that methocarbamol  also tends to make him confused.  I listened to her concerns and noted that at this point he should be able to wean away from any narcotic analgesic.  I have discontinued his morphine  and oxycodone  and discontinued the methocarbamol  also.  Will supplement this with ibuprofen  which she notes that he could tolerate.  Been using Tylenol  otherwise.  Will see if this helps to clear his sensorium although at the time of my visit his sensorium seems to be reasonably clear though he does tend to perseverate in his conversations and come off topic easily.  I looked at his incisions and note that there is some redness around the staples I have suggested that we paint these with Betadine and dressed them for the next few days ultimately the staples should be able to be removed fairly soon.  Patient has been being evaluated for placement.  It is possible that he may be able to go home with some home health physical therapy if his sensorium clears even just a little.

## 2024-01-09 NOTE — Progress Notes (Signed)
 Patient ID: [name], male   DOB: 1950/12/19, 73 y.o.   MRN: [id number] Patient seems to be tolerating things without the use of opioid medications.  He does become somewhat agitated and hallucinates at night he had episodes this morning was talking to a person who was not in the room and wanting to get out of bed.  The nurses requested Posey restraints however after some discussion we had noted that he just gotten his Seroquel  and after a period of about 45 minutes to an hour it seems that he is calming down.  Ultimately discharge to home or placement in rehab would be his best option.

## 2024-01-10 NOTE — Progress Notes (Signed)
  NEUROSURGERY PROGRESS NOTE   No issues overnight.   EXAM:  BP (!) 93/58 (BP Location: Right Arm)   Pulse 71   Temp (!) 97.3 F (36.3 C) (Oral)   Resp 18   Ht 5' 9 (1.753 m)   Wt 68 kg   SpO2 99%   BMI 22.15 kg/m   Awake, alert, remains somewhat confused Speech fluent CN grossly intact  5/5 BUE/BLE  Wounds c/d/i  IMPRESSION:  73 y.o. male POD# 10 s/p L2-5 lateral interbody fusion/posterior stabilization. Confusion is slowly improving.  PLAN: - Cont PT/OT - [facility] application pending    Augusto Blonder, MD [facility]

## 2024-01-10 NOTE — Plan of Care (Signed)
  Problem: Education: Goal: Knowledge of General Education information will improve Description: Including pain rating scale, medication(s)/side effects and non-pharmacologic comfort measures Outcome: Not Progressing   Problem: Activity: Goal: Risk for activity intolerance will decrease Outcome: Progressing   Problem: Nutrition: Goal: Adequate nutrition will be maintained Outcome: Progressing   Problem: Skin Integrity: Goal: Risk for impaired skin integrity will decrease Outcome: Progressing

## 2024-01-10 NOTE — TOC Progression Note (Signed)
 Transition of Care [facility]) - Progression Note    Patient Details  Name: [name] MRN: [id number] Date of Birth: 01-Dec-1950  Transition of [facility]) CM/SW Contact  Tandy Fam, [place] Phone Number: 01/10/2024, 10:50 AM  Clinical Narrative:   CSW spoke with patient's spouse to provide update on disposition. CSW confirmed with spouse that [facility] is still first choice, [facility] is second choice. CSW contacted [facility] to ask them to review, referral is still pending; awaiting response. CSW to follow.    Expected Discharge Plan: Skilled Nursing Facility Barriers to Discharge: Continued Medical Work up, English as a second language teacher, Facility will not accept until restraint criteria met  Expected Discharge Plan and Services                                               Social Determinants of Health (SDOH) Interventions SDOH Screenings   Food Insecurity: Patient Unable To Answer (12/31/2023)  Housing: Patient Unable To Answer (12/31/2023)  Transportation Needs: Patient Unable To Answer (12/31/2023)  Utilities: Patient Unable To Answer (12/31/2023)  Alcohol Screen: Low Risk  (10/19/2023)  Depression (PHQ2-9): Low Risk  (10/19/2023)  Financial Resource Strain: Low Risk  (10/19/2023)  Physical Activity: Sufficiently Active (10/19/2023)  Social Connections: Patient Unable To Answer (12/31/2023)  Recent Concern: Social Connections - Moderately Isolated (10/19/2023)  Stress: No Stress Concern Present (10/19/2023)  Tobacco Use: Medium Risk (12/31/2023)  Health Literacy: Adequate Health Literacy (10/19/2023)    Readmission Risk Interventions     No data to display

## 2024-01-10 NOTE — Progress Notes (Signed)
 Physical Therapy Treatment Patient Details Name: [name] MRN: [id number] DOB: [birth year] Today's Date: 01/10/2024   History of Present Illness Pt is a 73 y.o. male s/p L3-5 prone trans-psoas approach for lateral interbody fusion; R L4-5 facetectomy and posterior segmental instrumentation L3-5. PMH significant for PD, HTN, DM, gout, glaucoma, PVD    PT Comments  Pt making steady progress and demonstrates improved cognition. Now A&Ox3, but needs step by step cueing and instruction for maintaining spinal precautions and totalA for donning brace. Pt requiring min assist overall for functional mobility. Negotiated 6 steps with railings to simulate home set up; pt spouse present and educated on guarding technique. Patient will benefit from continued inpatient follow up therapy, <3 hours/day in order to address deficits, maximize functional mobility and decrease caregiver burden.    If plan is discharge home, recommend the following: A little help with walking and/or transfers;A little help with bathing/dressing/bathroom;Assistance with cooking/housework;Assist for transportation;Help with stairs or ramp for entrance;Supervision due to cognitive status   Can travel by private vehicle     Yes  Equipment Recommendations  Rolling walker (2 wheels);BSC/3in1    Recommendations for Other Services       Precautions / Restrictions Precautions Precautions: Back Precaution Booklet Issued: No Recall of Precautions/Restrictions: Impaired Required Braces or Orthoses: Spinal Brace Spinal Brace: Lumbar corset;Applied in sitting position Restrictions Weight Bearing Restrictions Per Provider Order: No     Mobility  Bed Mobility Overal bed mobility: Needs Assistance Bed Mobility: Rolling, Sidelying to Sit Rolling: Min assist Sidelying to sit: Min assist       General bed mobility comments: Verbal cues for log roll technique, step by step instruction, light assist to roll into sidelying and  bring BLE's off edge of bed. Pt able to bring trunk to upright position. HOB flat.    Transfers Overall transfer level: Needs assistance Equipment used: Rolling walker (2 wheels) Transfers: Sit to/from Stand Sit to Stand: Min assist           General transfer comment: Light assist to rise from lower surface. Increased time/effort    Ambulation/Gait Ambulation/Gait assistance: Contact guard assist Gait Distance (Feet): 150 Feet (150, 200) Assistive device: Rolling walker (2 wheels) Gait Pattern/deviations: Step-through pattern, Decreased stride length, Narrow base of support, Scissoring Gait velocity: decreased Gait velocity interpretation: <1.31 ft/sec, indicative of household ambulator   General Gait Details: Pt with narrow BOS, intermittent crossing of feet, requires cues for wider BOS and upward gaze. Increased instability with dual tasking i.e. head turns. Slowed speed with obstacle negotiation   Stairs Stairs: Yes Stairs assistance: Contact guard assist Stair Management: One rail Right, Two rails Number of Stairs: 6     Wheelchair Mobility     Tilt Bed    Modified Rankin (Stroke Patients Only)       Balance Overall balance assessment: Needs assistance Sitting-balance support: Feet supported Sitting balance-Leahy Scale: Good     Standing balance support: No upper extremity supported, During functional activity Standing balance-Leahy Scale: Fair                              Hotel manager: No apparent difficulties  Cognition Arousal: Alert Behavior During Therapy: Flat affect   PT - Cognitive impairments: Awareness, Memory, Attention, Problem solving, Safety/Judgement                       PT - Cognition Comments: Pt A&Ox3,  decreased recall of precautions Following commands: Intact      Cueing Cueing Techniques: Verbal cues  Exercises      General Comments        Pertinent Vitals/Pain  Pain Assessment Pain Assessment: Faces Faces Pain Scale: Hurts a little bit Pain Location: Back Pain Descriptors / Indicators: Operative site guarding, Sore Pain Intervention(s): Monitored during session    Home Living                          Prior Function            PT Goals (current goals can now be found in the care plan section) Acute Rehab PT Goals Patient Stated Goal: not stated Potential to Achieve Goals: Good Progress towards PT goals: Progressing toward goals    Frequency    Min 5X/week      PT Plan      Co-evaluation              AM-PAC PT 6 Clicks Mobility   Outcome Measure  Help needed turning from your back to your side while in a flat bed without using bedrails?: A Little Help needed moving from lying on your back to sitting on the side of a flat bed without using bedrails?: A Little Help needed moving to and from a bed to a chair (including a wheelchair)?: A Little Help needed standing up from a chair using your arms (e.g., wheelchair or bedside chair)?: A Little Help needed to walk in hospital room?: A Little Help needed climbing 3-5 steps with a railing? : A Little 6 Click Score: 18    End of Session Equipment Utilized During Treatment: Gait belt;Back brace Activity Tolerance: Patient tolerated treatment well Patient left: in chair;with call bell/phone within reach;with chair alarm set Nurse Communication: Mobility status PT Visit Diagnosis: Unsteadiness on feet (R26.81);Other abnormalities of gait and mobility (R26.89);Muscle weakness (generalized) (M62.81);Pain Pain - part of body:  (back)     Time: 4332-9518 PT Time Calculation (min) (ACUTE ONLY): 31 min  Charges:    $Gait Training: 8-22 mins $Therapeutic Activity: 8-22 mins PT General Charges $$ ACUTE PT VISIT: 1 Visit                     Verdia Glad, PT, DPT Acute Rehabilitation Services Office [phone]    Claria Crofts 01/10/2024, 3:54 PM

## 2024-01-10 NOTE — Plan of Care (Signed)
  Problem: Clinical Measurements: Goal: Ability to maintain clinical measurements within normal limits will improve Outcome: Progressing Goal: Will remain free from infection Outcome: Progressing Goal: Diagnostic test results will improve Outcome: Progressing Goal: Respiratory complications will improve Outcome: Progressing Goal: Cardiovascular complication will be avoided Outcome: Progressing   Problem: Activity: Goal: Risk for activity intolerance will decrease Outcome: Progressing   Problem: Elimination: Goal: Will not experience complications related to bowel motility Outcome: Progressing Goal: Will not experience complications related to urinary retention Outcome: Progressing   Problem: Pain Managment: Goal: General experience of comfort will improve and/or be controlled Outcome: Progressing   Problem: Skin Integrity: Goal: Risk for impaired skin integrity will decrease Outcome: Progressing   Problem: Activity: Goal: Ability to avoid complications of mobility impairment will improve Outcome: Progressing Goal: Ability to tolerate increased activity will improve Outcome: Progressing Goal: Will remain free from falls Outcome: Progressing   Problem: Education: Goal: Knowledge of General Education information will improve Description: Including pain rating scale, medication(s)/side effects and non-pharmacologic comfort measures Outcome: Not Progressing   Problem: Health Behavior/Discharge Planning: Goal: Ability to manage health-related needs will improve Outcome: Not Progressing   Problem: Nutrition: Goal: Adequate nutrition will be maintained Outcome: Not Progressing   Problem: Coping: Goal: Level of anxiety will decrease Outcome: Not Progressing   Problem: Safety: Goal: Ability to remain free from injury will improve Outcome: Not Progressing   Problem: Education: Goal: Ability to verbalize activity precautions or restrictions will improve Outcome: Not  Progressing Goal: Knowledge of the prescribed therapeutic regimen will improve Outcome: Not Progressing Goal: Understanding of discharge needs will improve Outcome: Not Progressing

## 2024-01-11 ENCOUNTER — Encounter: Payer: Self-pay | Admitting: Adult Health

## 2024-01-11 MED ORDER — ENSURE PLUS HIGH PROTEIN PO LIQD
237.0000 mL | ORAL | Status: DC
  Administered 2024-01-12: 237 mL via ORAL

## 2024-01-11 NOTE — Progress Notes (Signed)
 Physical Therapy Treatment Patient Details Name: [name] MRN: [id number] DOB: 10/02/1950 Today's Date: 01/11/2024   History of Present Illness Pt is a 73 y.o. male s/p L3-5 prone trans-psoas approach for lateral interbody fusion; R L4-5 facetectomy and posterior segmental instrumentation L3-5. PMH significant for PD, HTN, DM, gout, glaucoma, PVD    PT Comments  Pt received in supine and agreeable to session with wife present throughout. Pt continues to require cues for back precautions throughout session and assist donning lumbar corset. Pt demonstrates difficulty following directional cues during ambulation requiring increased multimodal cues. Pt able to tolerate gait trial and complete 8 steps with up to min A. Pt demonstrates increased instability during turns and dual tasking requiring intermittent min A. Pt able to find his room with cues for room number. Pt continues to benefit from PT services to progress toward functional mobility goals.    If plan is discharge home, recommend the following: A little help with walking and/or transfers;A little help with bathing/dressing/bathroom;Assistance with cooking/housework;Assist for transportation;Help with stairs or ramp for entrance;Supervision due to cognitive status   Can travel by private vehicle     Yes  Equipment Recommendations  Rolling walker (2 wheels);BSC/3in1    Recommendations for Other Services       Precautions / Restrictions Precautions Precautions: Back Precaution Booklet Issued: No Recall of Precautions/Restrictions: Impaired Required Braces or Orthoses: Spinal Brace Spinal Brace: Lumbar corset;Applied in sitting position Restrictions Weight Bearing Restrictions Per Provider Order: No     Mobility  Bed Mobility Overal bed mobility: Needs Assistance Bed Mobility: Rolling, Sidelying to Sit Rolling: Contact guard assist Sidelying to sit: Min assist       General bed mobility comments: Cues for logroll  technique, min A for bringing BLE off EOB    Transfers Overall transfer level: Needs assistance Equipment used: Rolling walker (2 wheels) Transfers: Sit to/from Stand Sit to Stand: Min assist           General transfer comment: From low EOB with min A for anterior weight shift and power up. cues for hand placement    Ambulation/Gait Ambulation/Gait assistance: Contact guard assist, Min assist Gait Distance (Feet): 100 Feet Assistive device: Rolling walker (2 wheels) Gait Pattern/deviations: Step-through pattern, Decreased stride length, Narrow base of support, Scissoring, Drifts right/left Gait velocity: decreased     General Gait Details: Mostly CGA with intermittent min A due to increased instability. Increased instability with turns, backwards walking, and dual tasking i.e. searching for his room number. Cues for awareness. Pt demonstrates difficulty following directional commands during ambulation.   Stairs Stairs: Yes Stairs assistance: Contact guard assist Stair Management: Two rails Number of Stairs: 8 General stair comments: intermittent cues for foot placement on steps   Wheelchair Mobility     Tilt Bed    Modified Rankin (Stroke Patients Only)       Balance Overall balance assessment: Needs assistance Sitting-balance support: Feet supported Sitting balance-Leahy Scale: Good     Standing balance support: No upper extremity supported, During functional activity Standing balance-Leahy Scale: Fair Standing balance comment: with RW support                            Communication Communication Communication: No apparent difficulties  Cognition Arousal: Alert Behavior During Therapy: Flat affect   PT - Cognitive impairments: Awareness, Memory, Attention, Problem solving, Safety/Judgement  Following commands: Intact Following commands impaired: Follows one step commands with increased time, Follows  multi-step commands inconsistently, Follows one step commands inconsistently    Cueing Cueing Techniques: Verbal cues  Exercises      General Comments General comments (skin integrity, edema, etc.): Pt reports dizziness after first stand, BP sitting EOB 137/99. Pt reports improvement, but continued mild dizziness throughout session.      Pertinent Vitals/Pain Pain Assessment Pain Assessment: Faces Faces Pain Scale: Hurts a little bit Pain Location: B anterior hips Pain Descriptors / Indicators: Operative site guarding, Sore Pain Intervention(s): Monitored during session     PT Goals (current goals can now be found in the care plan section) Acute Rehab PT Goals Patient Stated Goal: not stated PT Goal Formulation: With patient Time For Goal Achievement: 01/15/24 Progress towards PT goals: Progressing toward goals    Frequency    Min 5X/week       AM-PAC PT 6 Clicks Mobility   Outcome Measure  Help needed turning from your back to your side while in a flat bed without using bedrails?: A Little Help needed moving from lying on your back to sitting on the side of a flat bed without using bedrails?: A Little Help needed moving to and from a bed to a chair (including a wheelchair)?: A Little Help needed standing up from a chair using your arms (e.g., wheelchair or bedside chair)?: A Little Help needed to walk in hospital room?: A Little Help needed climbing 3-5 steps with a railing? : A Little 6 Click Score: 18    End of Session Equipment Utilized During Treatment: Gait belt;Back brace Activity Tolerance: Patient tolerated treatment well Patient left: in chair;with call bell/phone within reach;with chair alarm set;with family/visitor present Nurse Communication: Mobility status PT Visit Diagnosis: Unsteadiness on feet (R26.81);Other abnormalities of gait and mobility (R26.89);Muscle weakness (generalized) (M62.81);Pain     Time: 1220-1243 PT Time Calculation (min)  (ACUTE ONLY): 23 min  Charges:    $Gait Training: 8-22 mins $Therapeutic Activity: 8-22 mins PT General Charges $$ ACUTE PT VISIT: 1 Visit                    Michaelle Adolphus, PTA Acute Rehabilitation Services Secure Chat Preferred  [phone]    Michaelle Adolphus 01/11/2024, 1:28 PM

## 2024-01-11 NOTE — Progress Notes (Signed)
 Nutrition Follow-up  DOCUMENTATION CODES:  Non-severe (moderate) malnutrition in context of chronic illness  INTERVENTION:  Continue regular diet as ordered Ordering assistance Ensure Plus High Protein daily, each supplement provides 350 kcal and 20 grams of protein. Magic cup TID with meals, each supplement provides 290 kcal and 9 grams of protein MVI with minerals daily New measured weight  NUTRITION DIAGNOSIS:  Moderate Malnutrition related to chronic illness (parkinson's disease) as evidenced by mild fat depletion, moderate muscle depletion, severe muscle depletion - remains applicable  GOAL:  Patient will meet greater than or equal to 90% of their needs - progressing  MONITOR:  PO intake, Supplement acceptance, Labs, Weight trends  REASON FOR ASSESSMENT:  Consult Assessment of nutrition requirement/status  ASSESSMENT:  Pt admitted with back and leg pain related to spondylosis, stenosis and spondylolisthesis L3-L5. PMH significant for Parkinson's disease, HTN, GERD.   6/6: s/p L3-4, L4-5 trans-psoas approach for lateral interbody fusion  Pt resting in bed at the time of assessment. Awake and alert. States that he has been eating ~50 of his trays because he doesn't like what is being sent to him. Adjusted to ordering assistance to ensure pt gets a say in what is ordered. Pt states he likes the magic cup, unsure if he likes the ensure. Doesn't think that he has consumed them.   Inquired about intake with RN, also unsure if pt is drinking much of the ensure. Does also reiterate that he was not happy with his breakfast this AM. Will request new weight as none has been taken since admission.   Admit / Current weight: 68 kg   Average Meal Intake: 6/11-6/17: 38% intake x 8 recorded meals  Nutritionally Relevant Medications: Scheduled Meds:  atorvastatin   20 mg Oral Daily   docusate sodium   100 mg Oral BID   Ensure Plus High Protein  237 mL Oral BID BM   multivitamin with  minerals  1 tablet Oral Daily   pantoprazole   40 mg Oral BID   senna  1 tablet Oral BID   PRN Meds: bisacodyl , ondansetron    Labs Reviewed  NUTRITION - FOCUSED PHYSICAL EXAM: Flowsheet Row Most Recent Value  Orbital Region Mild depletion  Upper Arm Region Severe depletion  Thoracic and Lumbar Region Moderate depletion  Buccal Region Mild depletion  Temple Region Mild depletion  Clavicle Bone Region Moderate depletion  Clavicle and Acromion Bone Region Severe depletion  Scapular Bone Region Severe depletion  Dorsal Hand Moderate depletion  Patellar Region Severe depletion  Anterior Thigh Region Severe depletion  Posterior Calf Region Severe depletion  Edema (RD Assessment) None  Hair Reviewed  Eyes Reviewed  Mouth Reviewed  Skin Reviewed  Nails Reviewed   Diet Order:   Diet Order             Diet regular Room service appropriate? Yes; Fluid consistency: Thin  Diet effective now                   EDUCATION NEEDS:  Not appropriate for education at this time  Skin:  Skin Assessment: Reviewed RN Assessment (closed lower back incision)  Last BM:  6/17 - type 5  Height:  Ht Readings from Last 1 Encounters:  12/31/23 5' 9 (1.753 m)    Weight:  Wt Readings from Last 1 Encounters:  12/31/23 68 kg   BMI:  Body mass index is 22.15 kg/m.  Estimated Nutritional Needs:  Kcal:  1700-1900 Protein:  90-105g Fluid:  >/=1.7L    [name]  Arlene Ben, RD, LDN, CNSC Registered Dietitian II Please reach out via secure chat

## 2024-01-11 NOTE — TOC Progression Note (Signed)
 Transition of Care [facility]) - Progression Note    Patient Details  Name: [name] MRN: [id number] Date of Birth: 03/13/1951  Transition of Care [facility]) CM/SW Contact  Tandy Fam, [place] Phone Number: 01/11/2024, 4:25 PM  Clinical Narrative:   CSW updated by [facility] after hours that they are not able to offer a bed for the patient. CSW confirmed with [facility] that they are able to offer and have a bed available. CSW contacted CMA to request insurance authorization. CSW met with patient's spouse at bedside to provide update, she is in agreement and requested the contact information to [facility] to schedule a tour. Spouse also concerned that the patient's confusion is taking so long to clear up. She said she's tried to reach out to patient's outpatient neurologist to see if they can see him for any possible medication changes to his parkinson's meds, but wondering if they may be able to see him here (he goes to [facility]). CSW sent message to neurosurgery PA, she is in agreement; consult placed. Update provided to spouse. CSW to follow.    Expected Discharge Plan: Skilled Nursing Facility Barriers to Discharge: Facility will not accept until restraint criteria met, Continued Medical Work up  Expected Discharge Plan and Services                                               Social Determinants of Health (SDOH) Interventions SDOH Screenings   Food Insecurity: Patient Unable To Answer (12/31/2023)  Housing: Patient Unable To Answer (12/31/2023)  Transportation Needs: Patient Unable To Answer (12/31/2023)  Utilities: Patient Unable To Answer (12/31/2023)  Alcohol Screen: Low Risk  (10/19/2023)  Depression (PHQ2-9): Low Risk  (10/19/2023)  Financial Resource Strain: Low Risk  (10/19/2023)  Physical Activity: Sufficiently Active (10/19/2023)  Social Connections: Patient Unable To Answer (12/31/2023)  Recent Concern: Social Connections - Moderately Isolated  (10/19/2023)  Stress: No Stress Concern Present (10/19/2023)  Tobacco Use: Medium Risk (12/31/2023)  Health Literacy: Adequate Health Literacy (10/19/2023)    Readmission Risk Interventions     No data to display

## 2024-01-11 NOTE — Plan of Care (Signed)
  Problem: Clinical Measurements: Goal: Ability to maintain clinical measurements within normal limits will improve Outcome: Progressing Goal: Will remain free from infection Outcome: Progressing Goal: Diagnostic test results will improve Outcome: Progressing Goal: Respiratory complications will improve Outcome: Progressing Goal: Cardiovascular complication will be avoided Outcome: Progressing   Problem: Activity: Goal: Risk for activity intolerance will decrease Outcome: Progressing   Problem: Nutrition: Goal: Adequate nutrition will be maintained Outcome: Progressing   Problem: Coping: Goal: Level of anxiety will decrease Outcome: Progressing   Problem: Elimination: Goal: Will not experience complications related to bowel motility Outcome: Progressing Goal: Will not experience complications related to urinary retention Outcome: Progressing   Problem: Safety: Goal: Ability to remain free from injury will improve Outcome: Progressing   Problem: Skin Integrity: Goal: Risk for impaired skin integrity will decrease Outcome: Progressing   Problem: Pain Managment: Goal: General experience of comfort will improve and/or be controlled Outcome: Progressing

## 2024-01-12 DIAGNOSIS — R2689 Other abnormalities of gait and mobility: Secondary | ICD-10-CM | POA: Diagnosis not present

## 2024-01-12 DIAGNOSIS — I10 Essential (primary) hypertension: Secondary | ICD-10-CM | POA: Diagnosis not present

## 2024-01-12 DIAGNOSIS — E440 Moderate protein-calorie malnutrition: Secondary | ICD-10-CM | POA: Diagnosis not present

## 2024-01-12 DIAGNOSIS — Z01810 Encounter for preprocedural cardiovascular examination: Secondary | ICD-10-CM | POA: Diagnosis not present

## 2024-01-12 DIAGNOSIS — G20A2 Parkinson's disease without dyskinesia, with fluctuations: Secondary | ICD-10-CM | POA: Diagnosis not present

## 2024-01-12 DIAGNOSIS — M4316 Spondylolisthesis, lumbar region: Secondary | ICD-10-CM | POA: Diagnosis not present

## 2024-01-12 DIAGNOSIS — H409 Unspecified glaucoma: Secondary | ICD-10-CM | POA: Diagnosis not present

## 2024-01-12 DIAGNOSIS — M6281 Muscle weakness (generalized): Secondary | ICD-10-CM | POA: Diagnosis not present

## 2024-01-12 DIAGNOSIS — K219 Gastro-esophageal reflux disease without esophagitis: Secondary | ICD-10-CM | POA: Diagnosis not present

## 2024-01-12 DIAGNOSIS — E785 Hyperlipidemia, unspecified: Secondary | ICD-10-CM | POA: Diagnosis not present

## 2024-01-12 DIAGNOSIS — R278 Other lack of coordination: Secondary | ICD-10-CM | POA: Diagnosis not present

## 2024-01-12 DIAGNOSIS — G20C Parkinsonism, unspecified: Secondary | ICD-10-CM | POA: Diagnosis not present

## 2024-01-12 DIAGNOSIS — R410 Disorientation, unspecified: Secondary | ICD-10-CM | POA: Diagnosis not present

## 2024-01-12 DIAGNOSIS — G903 Multi-system degeneration of the autonomic nervous system: Secondary | ICD-10-CM | POA: Diagnosis not present

## 2024-01-12 DIAGNOSIS — G4733 Obstructive sleep apnea (adult) (pediatric): Secondary | ICD-10-CM | POA: Diagnosis not present

## 2024-01-12 NOTE — Progress Notes (Signed)
    Providing Compassionate, Quality Care - Together   NEUROSURGERY PROGRESS NOTE     S: No issues overnight.    O: EXAM:  BP (!) 137/93 (BP Location: Left Arm)   Pulse 77   Temp 97.7 F (36.5 C) (Oral)   Resp 16   Ht 5' 9 (1.753 m)   Wt 64.6 kg   SpO2 100%   BMI 21.03 kg/m     Awake, alert, oriented  Speech fluent, appropriate  Ambulating in hall w/ PT without significant difficulty Dressings c/d/i   ASSESSMENT:  73 y.o. s/p L2-5 lateral interbody fusion/posterior stabilization.     PLAN: -Continue therapies as tolerated -Awaiting SNF placement -Call w/ questions/concerns.   E[place] Golden, [facility]

## 2024-01-12 NOTE — Progress Notes (Signed)
 Physical Therapy Treatment Patient Details Name: [name] MRN: [id number] DOB: 17-Jan-1951 Today's Date: 01/12/2024   History of Present Illness Pt is a 73 y.o. male s/p L3-5 prone trans-psoas approach for lateral interbody fusion; R L4-5 facetectomy and posterior segmental instrumentation L3-5. PMH significant for PD, HTN, DM, gout, glaucoma, PVD    PT Comments  Pt received sitting in the recliner and agreeable to session. Pt demonstrates L drift and decreased awareness during ambulation requiring increased cues and intermittent min A for balance. Pt able to tolerate increased gait distance this session with increased instability during head turns and directional turns. Pt demonstrates impaired BLE coordination during steps and intermittent scissoring. Pt able to complete x5 serial STS with and without UE support. Pt requires min A for balance during standing tasks without UE support. Pt requires increased cues for problem solving and directions throughout session. Pt continues to benefit from PT services to progress toward functional mobility goals.     If plan is discharge home, recommend the following: A little help with walking and/or transfers;A little help with bathing/dressing/bathroom;Assistance with cooking/housework;Assist for transportation;Help with stairs or ramp for entrance;Supervision due to cognitive status   Can travel by private vehicle     Yes  Equipment Recommendations  Rolling walker (2 wheels);BSC/3in1    Recommendations for Other Services       Precautions / Restrictions Precautions Precautions: Back Recall of Precautions/Restrictions: Impaired Precaution/Restrictions Comments: Pt aware he has precautions, but is unable to recall what they are Required Braces or Orthoses: Spinal Brace Spinal Brace: Lumbar corset;Applied in sitting position Restrictions Weight Bearing Restrictions Per Provider Order: No     Mobility  Bed Mobility                General bed mobility comments: Pt in recliner at beginning and end of session    Transfers Overall transfer level: Needs assistance Equipment used: Rolling walker (2 wheels) Transfers: Sit to/from Stand Sit to Stand: Contact guard assist, Min assist           General transfer comment: from recliner x6 with cues for hand placement. x2 reps without UE support with min A for power up and anterior weight shift    Ambulation/Gait Ambulation/Gait assistance: Contact guard assist, Min assist Gait Distance (Feet): 250 Feet Assistive device: Rolling walker (2 wheels) Gait Pattern/deviations: Step-through pattern, Decreased stride length, Narrow base of support, Scissoring, Drifts right/left Gait velocity: decreased     General Gait Details: Pt demonstrates drift to L with L foot then kicking RW requiring intermittent min A for balance. increased instability with head turns and directional turns.   Stairs             Wheelchair Mobility     Tilt Bed    Modified Rankin (Stroke Patients Only)       Balance Overall balance assessment: Needs assistance Sitting-balance support: Feet supported Sitting balance-Leahy Scale: Good     Standing balance support: No upper extremity supported, During functional activity, Bilateral upper extremity supported Standing balance-Leahy Scale: Fair Standing balance comment: RW support during ambulation and min A for balance during marches with no UE support               High Level Balance Comments: static marches without UE support with intermittent LOB requiring min A to correct            Communication Communication Communication: Impaired Factors Affecting Communication: Difficulty expressing self (word finding difficulty at times)  Cognition  Arousal: Alert Behavior During Therapy: Flat affect   PT - Cognitive impairments: Awareness, Memory, Attention, Problem solving, Safety/Judgement                          Following commands: Intact Following commands impaired: Follows one step commands with increased time, Follows multi-step commands inconsistently    Cueing    Exercises Other Exercises Other Exercises: x5 serial STS    General Comments        Pertinent Vitals/Pain Pain Assessment Pain Assessment: Faces Faces Pain Scale: Hurts a little bit Pain Location: B anterior hips Pain Descriptors / Indicators: Sore Pain Intervention(s): Monitored during session     PT Goals (current goals can now be found in the care plan section) Acute Rehab PT Goals Patient Stated Goal: not stated PT Goal Formulation: With patient Time For Goal Achievement: 01/15/24 Progress towards PT goals: Progressing toward goals    Frequency    Min 5X/week       AM-PAC PT 6 Clicks Mobility   Outcome Measure  Help needed turning from your back to your side while in a flat bed without using bedrails?: A Little Help needed moving from lying on your back to sitting on the side of a flat bed without using bedrails?: A Little Help needed moving to and from a bed to a chair (including a wheelchair)?: A Little Help needed standing up from a chair using your arms (e.g., wheelchair or bedside chair)?: A Little Help needed to walk in hospital room?: A Little Help needed climbing 3-5 steps with a railing? : A Little 6 Click Score: 18    End of Session Equipment Utilized During Treatment: Gait belt;Back brace Activity Tolerance: Patient tolerated treatment well Patient left: in chair;with call bell/phone within reach;with chair alarm set Nurse Communication: Mobility status PT Visit Diagnosis: Unsteadiness on feet (R26.81);Other abnormalities of gait and mobility (R26.89);Muscle weakness (generalized) (M62.81);Pain     Time: 1610-9604 PT Time Calculation (min) (ACUTE ONLY): 28 min  Charges:    $Gait Training: 8-22 mins $Therapeutic Activity: 8-22 mins PT General Charges $$ ACUTE PT VISIT: 1  Visit                    Michaelle Adolphus, PTA Acute Rehabilitation Services Secure Chat Preferred  [phone]    Michaelle Adolphus 01/12/2024, 10:48 AM

## 2024-01-12 NOTE — Progress Notes (Signed)
 Occupational Therapy Treatment Patient Details Name: [name] MRN: [id number] DOB: 1950/10/01 Today's Date: 01/12/2024   History of present illness Pt is a 73 y.o. male s/p L3-5 prone trans-psoas approach for lateral interbody fusion; R L4-5 facetectomy and posterior segmental instrumentation L3-5. PMH significant for PD, HTN, DM, gout, glaucoma, PVD   OT comments  Pt supine in bed and agreeable to OT session.  Pt requires max assist for brace management, mod assist for LB dressing.  Able to recall bending precaution, but otherwise needs cueing to recall precautions and in order to adhere to precautions functionally.  Min assist for transfers, functional mobility using RW and grooming at sink.  Updated dc recommendations to <3hrs/day inpatient setting. Will follow acutely.       If plan is discharge home, recommend the following:  A little help with walking and/or transfers;A lot of help with bathing/dressing/bathroom;Assistance with cooking/housework;Direct supervision/assist for medications management;Direct supervision/assist for financial management;Help with stairs or ramp for entrance;Assist for transportation;Supervision due to cognitive status   Equipment Recommendations  Other (comment) (defer)    Recommendations for Other Services      Precautions / Restrictions Precautions Precautions: Back Precaution Booklet Issued: No Recall of Precautions/Restrictions: Impaired Precaution/Restrictions Comments: pt able to recall bending, but needs cueing to recall lifting/twisting. cueing functionally to adhere Required Braces or Orthoses: Spinal Brace Spinal Brace: Lumbar corset;Applied in sitting position Restrictions Weight Bearing Restrictions Per Provider Order: No       Mobility Bed Mobility Overal bed mobility: Needs Assistance Bed Mobility: Rolling, Sidelying to Sit, Sit to Sidelying Rolling: Min assist Sidelying to sit: Min assist     Sit to sidelying: Min  assist      Transfers Overall transfer level: Needs assistance Equipment used: Rolling walker (2 wheels) Transfers: Sit to/from Stand Sit to Stand: Min assist           General transfer comment: from EOB, cueing for hand placement     Balance Overall balance assessment: Needs assistance Sitting-balance support: Feet supported Sitting balance-Leahy Scale: Good     Standing balance support: No upper extremity supported, During functional activity, Bilateral upper extremity supported Standing balance-Leahy Scale: Fair Standing balance comment: RW support during ambulation and min A standing without UE support during grooming                           ADL either performed or assessed with clinical judgement   ADL Overall ADL's : Needs assistance/impaired     Grooming: Minimal assistance;Wash/dry hands;Sitting Grooming Details (indicate cue type and reason): min cueing to sequence washing hands, balance             Lower Body Dressing: Moderate assistance;Sit to/from stand Lower Body Dressing Details (indicate cue type and reason): cueing to adhere to precautions when donning shoes, min assist in standing Toilet Transfer: Minimal assistance;Ambulation;Rollator (4 wheels)           Functional mobility during ADLs: Minimal assistance;Rolling walker (2 wheels);Cueing for sequencing;Cueing for safety      Extremity/Trunk Assessment              Vision       Perception     Praxis     Communication Communication Communication: Impaired Factors Affecting Communication: Difficulty expressing self (word finding difficulty at times)   Cognition Arousal: Alert Behavior During Therapy: Flat affect Cognition: History of cognitive impairments, Cognition impaired   Orientation impairments: Time Awareness: Intellectual awareness impaired,  Online awareness impaired Memory impairment (select all impairments): Short-term memory, Working memory Attention  impairment (select first level of impairment): Focused attention Executive functioning impairment (select all impairments): Initiation, Organization, Sequencing, Reasoning, Problem solving OT - Cognition Comments: pt oriented to place, follows simple commands but does not recall back precautions, cueing to adhere functionally                 Following commands: Intact Following commands impaired: Follows one step commands with increased time, Follows multi-step commands inconsistently      Cueing   Cueing Techniques: Verbal cues  Exercises      Shoulder Instructions       General Comments      Pertinent Vitals/ Pain       Pain Assessment Pain Assessment: Faces Faces Pain Scale: Hurts a little bit Pain Location: back Pain Descriptors / Indicators: Sore Pain Intervention(s): Limited activity within patient's tolerance, Monitored during session, Repositioned  Home Living                                          Prior Functioning/Environment              Frequency  Min 2X/week        Progress Toward Goals  OT Goals(current goals can now be found in the care plan section)  Progress towards OT goals: Progressing toward goals  Acute Rehab OT Goals Patient Stated Goal: get better OT Goal Formulation: With patient Time For Goal Achievement: 01/15/24 Potential to Achieve Goals: Good  Plan      Co-evaluation                 AM-PAC OT 6 Clicks Daily Activity     Outcome Measure   Help from another person eating meals?: A Little Help from another person taking care of personal grooming?: A Little Help from another person toileting, which includes using toliet, bedpan, or urinal?: A Lot Help from another person bathing (including washing, rinsing, drying)?: A Lot Help from another person to put on and taking off regular upper body clothing?: A Little Help from another person to put on and taking off regular lower body clothing?: A  Lot 6 Click Score: 15    End of Session Equipment Utilized During Treatment: Gait belt;Rolling walker (2 wheels);Back brace  OT Visit Diagnosis: Unsteadiness on feet (R26.81);Muscle weakness (generalized) (M62.81);Other symptoms and signs involving cognitive function;Pain   Activity Tolerance Patient tolerated treatment well   Patient Left in bed;with call bell/phone within reach;with bed alarm set;with nursing/sitter in room   Nurse Communication Mobility status        Time: 4098-1191 OT Time Calculation (min): 17 min  Charges: OT General Charges $OT Visit: 1 Visit OT Treatments $Self Care/Home Management : 8-22 mins  Bary Boss, OT Acute Rehabilitation Services Office [phone] Secure Chat Preferred    Fredrich Jefferson 01/12/2024, 2:00 PM

## 2024-01-12 NOTE — Discharge Summary (Addendum)
 Patient ID: [name] MRN: [id number] DOB/AGE: 09-26-1950 73 y.o.  Admit date: 12/31/2023 Discharge date: 01/12/2024  Admission Diagnoses: Spondylolisthesis, lumbar region [M43.16] Spondylolisthesis at L4-L5 level [M43.16]   Discharge Diagnoses: Same   Discharged Condition: Stable  Hospital Course:  [name] is a 73 y.o. male who was admitted following an L2-5 lateral interbody fusion with posterior instrumentation on 12/31/23. They were recovered in PACU and transferred to the floor. Initially patient experienced confusion postoperatively, assumed to be related to GA and underlying PD. Gradually improved while admitted and with use of Seroquel . Also noted to have redness surrounding staples, improved w/ betadine/dressing. Pt stable for discharge today to SNF. Pt to f/u in office for routine post op visit. Pt is in agreement w/ plan. Staples to be removed 01/14/24.    Discharge Exam: Blood pressure (!) 140/86, pulse (!) 59, temperature 98 F (36.7 C), temperature source Oral, resp. rate 15, height 5' 9 (1.753 m), weight 64.6 kg, SpO2 100%.  Disposition: Discharge disposition: 03-Skilled Nursing Facility       Discharge Instructions     Change dressing (specify)   Complete by: As directed    Remove staples on 01/14/24   Incentive spirometry RT   Complete by: As directed       Allergies as of 01/12/2024       Reactions   Anesthesia S-i-40 [propofol ] Other (See Comments)   Delusions when medication is waking up    Aspirin  Other (See Comments)   Pt has had internal bleeding per wife   Haldol  [haloperidol ]    Haldol  does not work with patient   Other Other (See Comments)   Severe delirium with anesthesia per spouse enhances parkinsons symptoms        Medication List     STOP taking these medications    acetaminophen -codeine  300-30 MG tablet Commonly known as: TYLENOL  #3       TAKE these medications    acetaminophen  500 MG tablet Commonly known  as: TYLENOL  Take 2 tablets (1,000 mg total) by mouth every 6 (six) hours as needed for mild pain.   alfuzosin  10 MG 24 hr tablet Commonly known as: UROXATRAL  Take 1 tablet (10 mg total) by mouth daily.   atorvastatin  20 MG tablet Commonly known as: Lipitor Take 1 tablet (20 mg total) by mouth daily.   CALCIUM -VITAMIN D PO Take 1 tablet by mouth daily.   carbidopa -levodopa  25-100 MG tablet Commonly known as: SINEMET  IR Take 1.5 tablets by mouth 3 (three) times daily.   citalopram  20 MG tablet Commonly known as: CELEXA  Take 1 tablet (20 mg total) by mouth at bedtime.   dorzolamide -timolol  2-0.5 % ophthalmic solution Commonly known as: COSOPT  Instill 1 drop into both eyes twice a day   FIBER ADULT GUMMIES PO Take 1 tablet by mouth 2 (two) times daily.   ibuprofen  600 MG tablet Commonly known as: ADVIL  Take 1 tablet (600 mg total) by mouth 3 (three) times daily as needed.   irbesartan  75 MG tablet Commonly known as: AVAPRO  Take 1 tablet (75 mg total) by mouth every morning.   MULTIVITAMIN ADULTS 50+ PO Take 1 tablet by mouth daily.   Myrbetriq  50 MG Tb24 tablet Generic drug: mirabegron  ER Take 1 tablet (50 mg total) by mouth daily.   nitroGLYCERIN  0.4 MG SL tablet Commonly known as: NITROSTAT  Place 1 tablet (0.4 mg total) under the tongue every 5 (five) minutes as needed for chest pain.   pantoprazole  40 MG tablet Commonly  known as: PROTONIX  Take 1 tablet (40 mg total) by mouth 2 (two) times daily.   Rhopressa  0.02 % Soln Generic drug: Netarsudil  Dimesylate Place 1 drop into the left eye every evening.   rivastigmine  4.5 MG capsule Commonly known as: EXELON  Take 1 capsule (4.5 mg total) by mouth 2 (two) times daily.   Rocklatan  0.02-0.005 % Soln Generic drug: Netarsudil -Latanoprost  Place 1 drop into the left eye every night at bedtime.   trihexyphenidyl  2 MG tablet Commonly known as: ARTANE  Take 0.5 tablets (1 mg total) by mouth 2 (two) times daily.    verapamil  120 MG CR tablet Commonly known as: CALAN -SR Take 1 tablet (120 mg total) by mouth every morning.               Discharge Care Instructions  (From admission, onward)           Start     Ordered   01/12/24 0000  Change dressing (specify)       Comments: Remove staples on 01/14/24   01/12/24 1305             Signed: Abraham Hoffmann [name] 01/12/2024, 1:06 PM

## 2024-01-12 NOTE — TOC Transition Note (Signed)
 Transition of Care [facility] - Discharge Note   Patient Details  Name: [name] MRN: [id number] Date of Birth: 1950/08/12  [facility]) CM/SW Contact:  Tandy Fam, LCSW Phone Number: 01/12/2024, 1:50 PM   Clinical Narrative:   CSW spoke with spouse, she is in agreement with discharge today and will just schedule an appointment outpatient for patient to see his neurologist. Family will provide transport. CSW updated MD, patient stable for discharge. Room available at [facility]. CSW sent discharge information and confirmed receipt, no further TOC needs.  Nurse to call report to [phone], Room 606.    Final next level of care: Skilled Nursing Facility Barriers to Discharge: Barriers Resolved   Patient Goals and CMS Choice            Discharge Placement              Patient chooses bed at: [facility] Patient to be transferred to facility by: Family Name of family member notified: Felipa Horsfall Patient and family notified of of transfer: 01/12/24  Discharge Plan and Services Additional resources added to the After Visit Summary for                                       Social Drivers of Health (SDOH) Interventions SDOH Screenings   Food Insecurity: Patient Unable To Answer (12/31/2023)  Housing: Patient Unable To Answer (12/31/2023)  Transportation Needs: Patient Unable To Answer (12/31/2023)  Utilities: Patient Unable To Answer (12/31/2023)  Alcohol Screen: Low Risk  (10/19/2023)  Depression (PHQ2-9): Low Risk  (10/19/2023)  Financial Resource Strain: Low Risk  (10/19/2023)  Physical Activity: Sufficiently Active (10/19/2023)  Social Connections: Patient Unable To Answer (12/31/2023)  Recent Concern: Social Connections - Moderately Isolated (10/19/2023)  Stress: No Stress Concern Present (10/19/2023)  Tobacco Use: Medium Risk (12/31/2023)  Health Literacy: Adequate Health Literacy (10/19/2023)     Readmission Risk Interventions     No data to  display

## 2024-01-13 ENCOUNTER — Other Ambulatory Visit (HOSPITAL_COMMUNITY): Payer: Self-pay

## 2024-01-13 DIAGNOSIS — E785 Hyperlipidemia, unspecified: Secondary | ICD-10-CM | POA: Diagnosis not present

## 2024-01-13 DIAGNOSIS — G903 Multi-system degeneration of the autonomic nervous system: Secondary | ICD-10-CM | POA: Diagnosis not present

## 2024-01-13 DIAGNOSIS — E440 Moderate protein-calorie malnutrition: Secondary | ICD-10-CM | POA: Diagnosis not present

## 2024-01-13 DIAGNOSIS — I10 Essential (primary) hypertension: Secondary | ICD-10-CM | POA: Diagnosis not present

## 2024-01-13 MED ORDER — QUETIAPINE FUMARATE 25 MG PO TABS
25.0000 mg | ORAL_TABLET | Freq: Three times a day (TID) | ORAL | 1 refills | Status: DC
  Filled 2024-01-13: qty 90, 30d supply, fill #0

## 2024-01-14 ENCOUNTER — Other Ambulatory Visit (HOSPITAL_COMMUNITY): Payer: Self-pay

## 2024-01-17 ENCOUNTER — Encounter: Payer: Self-pay | Admitting: Adult Health

## 2024-01-17 ENCOUNTER — Ambulatory Visit (INDEPENDENT_AMBULATORY_CARE_PROVIDER_SITE_OTHER): Admitting: Adult Health

## 2024-01-17 ENCOUNTER — Other Ambulatory Visit: Payer: Self-pay

## 2024-01-17 ENCOUNTER — Other Ambulatory Visit (HOSPITAL_COMMUNITY): Payer: Self-pay

## 2024-01-17 VITALS — BP 118/68 | HR 76 | Ht 71.0 in | Wt 151.4 lb

## 2024-01-17 DIAGNOSIS — G20A2 Parkinson's disease without dyskinesia, with fluctuations: Secondary | ICD-10-CM

## 2024-01-17 DIAGNOSIS — G4733 Obstructive sleep apnea (adult) (pediatric): Secondary | ICD-10-CM

## 2024-01-17 DIAGNOSIS — R410 Disorientation, unspecified: Secondary | ICD-10-CM | POA: Diagnosis not present

## 2024-01-17 MED ORDER — QUETIAPINE FUMARATE 25 MG PO TABS
ORAL_TABLET | ORAL | 11 refills | Status: DC
Start: 2024-01-17 — End: 2024-02-01
  Filled 2024-01-17: qty 90, 30d supply, fill #0
  Filled 2024-01-24 – 2024-02-09 (×2): qty 90, 30d supply, fill #1

## 2024-01-17 NOTE — Progress Notes (Signed)
 [facility] PATIENT: [name] DOB: Dec 12, 1950   REASON FOR VISIT: Follow-up for tremor mild cognitive impairment, depression HISTORY FROM: Patient and wife   Chief complaint: Chief Complaint  Patient presents with   Follow-up    RM 3, Pt w/wife. Pt here for medication adjustment. Pt had back surgery earlier this month and is currently in a rehab facility. Pt and wife are questioning if he is on the correct medications. Paperwork brought in from facility.     Follow-up visit:  Prior visit: 10/14/2023   Brief HPI:  [name] is a 73 y.o. male who is followed for Parkinson's disease complicated by tremor, gait impairment, REM sleep behavior and cognitive impairment and OSA on CPAP (dx'd 06/2023, autoPAP set up 07/2023).  He was initially started on Sinemet  in 2014. Attempted to increase Sinemet  to 2 tab TID but dificulty tolerating therefore Azilect  added in 2021 but difficulty tolerating therefore switched to Artane .  He is also on Exelon  for cognition.   At prior visit, recommended continuation of Sinemet  1.5 tab TID and Artane  2mg  0.5 tab BID. Did note some low BP during PT sessions, advised to continue to monitor and increase water  intake. Continued on Exelon  4.5mg  BID.  CPAP compliance report showed adequate usage with residual AHI of 5.1, noted some difficulty tolerating FFM and advised to f/u with DME for change of interface.     Interval history:  Patient returns for sooner scheduled visit per wife request to discuss current medications.  He underwent back surgery on 6/6 (L2-5 lateral interbody fusion with posterior instrumentation) with postop confusion and delirium requiring restraints,  improved with use of Seroquel  and discontinuing opioids and muscle relaxants, suspected confusion due to GA, PD and possibly medication side effects.  He was discharged on Seroquel  25/50.  He was discharged to [facility] rehab on 6/18, has not yet started  therapies. He continues to have occasional confusion although some what improving per wife, at times with paranoia and visual hallucinations, also has been wandering and currently wearing locator band on ankle. PD symptoms overall stable, continues on Sinemet  and Artane . He also continues Exelon  and Celexa .   Wife is concerned regarding medications that were still on medication list at discharge and carried over at rehab although had been previously stopped including verapamil , Myrbetriq  and eye drops. Reports verapamil  discontinued by cardiology last month as it was suspected possibly contributing to orthostatic hypotension.   There is an order for nightly use of CPAP at rehab and she brought his CPAP to facility but per wife, has not been used since arriving.        REVIEW OF SYSTEMS: Full 14 system review of systems performed and notable only for those listed in HPI only and all other systems negative   ALLERGIES: Allergies  Allergen Reactions   Anesthesia S-I-40 [Propofol ] Other (See Comments)    Delusions when medication is waking up     Aspirin  Other (See Comments)    Pt has had internal bleeding per wife   Fentanyl  Other (See Comments)    Hallucinations and confusion   Haldol  [Haloperidol ]     Haldol  does not work with patient   Other Other (See Comments)    Severe delirium with anesthesia per spouse enhances parkinsons symptoms    HOME MEDICATIONS: Outpatient Medications Prior to Visit  Medication Sig Dispense Refill   acetaminophen  (TYLENOL ) 500 MG tablet Take 2 tablets (1,000 mg total) by mouth every 6 (six) hours as needed for  mild pain.     alfuzosin  (UROXATRAL ) 10 MG 24 hr tablet Take 1 tablet (10 mg total) by mouth daily. 90 tablet 3   atorvastatin  (LIPITOR) 20 MG tablet Take 1 tablet (20 mg total) by mouth daily. 90 tablet 3   carbidopa -levodopa  (SINEMET  IR) 25-100 MG tablet Take 1.5 tablets by mouth 3 (three) times daily. 405 tablet 3   citalopram  (CELEXA ) 20 MG  tablet Take 1 tablet (20 mg total) by mouth at bedtime. 90 tablet 1   dorzolamide -timolol  (COSOPT ) 22.3-6.8 MG/ML ophthalmic solution Instill 1 drop into both eyes twice a day 10 mL 11   FIBER ADULT GUMMIES PO Take 1 tablet by mouth 2 (two) times daily.     ibuprofen  (ADVIL ) 600 MG tablet Take 1 tablet (600 mg total) by mouth 3 (three) times daily as needed. 90 tablet 2   irbesartan  (AVAPRO ) 75 MG tablet Take 1 tablet (75 mg total) by mouth every morning. 30 tablet 2   Multiple Vitamins-Minerals (MULTIVITAMIN ADULTS 50+ PO) Take 1 tablet by mouth daily.     Netarsudil -Latanoprost  (ROCKLATAN ) 0.02-0.005 % SOLN Place 1 drop into the left eye every night at bedtime. 2.5 mL 3   nitroGLYCERIN  (NITROSTAT ) 0.4 MG SL tablet Place 1 tablet (0.4 mg total) under the tongue every 5 (five) minutes as needed for chest pain. 25 tablet 3   pantoprazole  (PROTONIX ) 40 MG tablet Take 1 tablet (40 mg total) by mouth 2 (two) times daily. 180 tablet 2   QUEtiapine  (SEROQUEL ) 25 MG tablet Take 1 tablet (25 mg total) by mouth in the morning, and 2 tablets (50mg ) at bedtime. (Patient taking differently: Take 25 mg by mouth daily. Take 25 mg in am) 90 tablet 1   rivastigmine  (EXELON ) 4.5 MG capsule Take 1 capsule (4.5 mg total) by mouth 2 (two) times daily. 180 capsule 3   trihexyphenidyl  (ARTANE ) 2 MG tablet Take 0.5 tablets (1 mg total) by mouth 2 (two) times daily. 30 tablet 11   CALCIUM -VITAMIN D PO Take 1 tablet by mouth daily. (Patient not taking: Reported on 01/17/2024)     MYRBETRIQ  50 MG TB24 tablet Take 1 tablet (50 mg total) by mouth daily. (Patient not taking: Reported on 01/17/2024) 30 tablet 11   Netarsudil  Dimesylate (RHOPRESSA ) 0.02 % SOLN Place 1 drop into the left eye every evening. (Patient not taking: Reported on 10/19/2023) 2.5 mL 11   verapamil  (CALAN -SR) 120 MG CR tablet Take 1 tablet (120 mg total) by mouth every morning. (Patient not taking: Reported on 12/14/2023) 90 tablet 3   No facility-administered  medications prior to visit.    PAST MEDICAL HISTORY: Past Medical History:  Diagnosis Date   Allergy    seasonal   Anxiety    Arthritis    BPH (benign prostatic hyperplasia)    Complication of anesthesia    Pt. stated he had a reaction that ended in him requiring urinary cath placement; hx delirium worsening parkinson symptoms   Depression    Diverticulosis    GERD (gastroesophageal reflux disease)    esophageal spasms   Glaucoma    Gout    Head injury, closed, with concussion    Hepatitis C    chronic - Has been treated with Harvoni   HLD (hyperlipidemia)    statin intolerant (Crestor  & Simvastatin ) - Taking Livalo  1mg  / week   Hypertension    Neuromuscular disorder (HCC)    Parkinson's Disease   Parkinson's disease (HCC)    Plantar fasciitis  right   PVD (peripheral vascular disease) (HCC)    With no claudication; only mild abdominal aortic atherosclerosis noted on ultrasound.   Sleep apnea    Wears CPAP nightly   Spinal stenosis of lumbar region    Thoracic ascending aortic aneurysm (HCC)    4.2 cm ascending TAA 09/2016 CT, 1 yr f/u rec   Ulcer     PAST SURGICAL HISTORY: Past Surgical History:  Procedure Laterality Date   ANTERIOR LAT LUMBAR FUSION N/A 12/31/2023   Procedure: Prone trans-psoas interbody fusion - Lumbar three-Lumbar four - Lumbar four-Lumbar five, right facetectomy Lumbar four-five;  Surgeon: [name], MD;  Location: [facility] OR;  Service: Neurosurgery;  Laterality: N/A;   BUNIONECTOMY WITH WEIL OSTEOTOMY Right 11/02/2019   Procedure: Right Foot Lapidus, Modified McBride Bunionectomy,  Hallux Akin Osteotomy;  Surgeon: Kit Rush, MD;  Location: [facility];  Service: Orthopedics;  Laterality: Right;   CARDIAC CATHETERIZATION  2005   30% Cx. Dr. Lavon   cataract surgery Left 11/05/2014   COLONOSCOPY     COLONOSCOPY N/A 01/09/2021   Procedure: COLONOSCOPY;  Surgeon: [name], MD;  Location: [facility];  Service:  Gastroenterology;  Laterality: N/A;   ESOPHAGOGASTRODUODENOSCOPY N/A 05/02/2015   Procedure: ESOPHAGOGASTRODUODENOSCOPY (EGD);  Surgeon: [name], MD;  Location: [facility];  Service: Endoscopy;  Laterality: N/A;   ESOPHAGOGASTRODUODENOSCOPY  05/02/2015   no source of pt chest pain endoscopically evident. small hiatal hernia.   ESOPHAGOGASTRODUODENOSCOPY (EGD) WITH PROPOFOL  N/A 01/02/2021   Procedure: ESOPHAGOGASTRODUODENOSCOPY (EGD) WITH PROPOFOL ;  Surgeon: [name], MD;  Location: [facility];  Service: Endoscopy;  Laterality: N/A;   HARDWARE REMOVAL Right 11/02/2019   Procedure: Second Metatarsal Removal of Deep Implant and Rotational Osteotomy;  Surgeon: Kit Rush, MD;  Location: [facility];  Service: Orthopedics;  Laterality: Right;   IR ANGIOGRAM SELECTIVE EACH ADDITIONAL VESSEL  01/04/2021   IR ANGIOGRAM SELECTIVE EACH ADDITIONAL VESSEL  01/04/2021   IR ANGIOGRAM VISCERAL SELECTIVE  01/04/2021   IR US  GUIDE VASC ACCESS RIGHT  01/04/2021   LUMBAR LAMINECTOMY/DECOMPRESSION MICRODISCECTOMY N/A 07/03/2021   Procedure: Lumbar three through five decompression with lumbar three through five insitu fusion;  Surgeon: [name], MD;  Location: [facility] OR;  Service: Orthopedics;  Laterality: N/A;   LUMBAR PERCUTANEOUS PEDICLE SCREW 2 LEVEL N/A 12/31/2023   Procedure: LUMBAR PERCUTANEOUS PEDICLE SCREW LUMBAR THREE-LUMBAR FIVE;  Surgeon: [name], MD;  Location: [facility];  Service: Neurosurgery;  Laterality: N/A;   MEMBRANE PEEL Left 03/14/2014   Procedure: MEMBRANE PEEL; ENDOLASER;  Surgeon: [name], MD;  Location: [facility];  Service: Ophthalmology;  Laterality: Left;   NM MYOVIEW  LTD  10/2015   LOW RISK. Small, fixed basal lateral defect - likely diaphragmatic attenuation. EF 69%   PARS PLANA VITRECTOMY Left 03/14/2014   Procedure: PARS PLANA VITRECTOMY WITH 25 GAUGE;  Surgeon: [name], MD;  Location: 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  No R WMA.  GRII DD-moderately dilated LA..  Mildly dilated RV but normal function.  Normal RAP/CVP.  Trivial AI with mild to moderate sclerosis-no stenosis   UMBILICAL HERNIA REPAIR  2023   UPPER GASTROINTESTINAL ENDOSCOPY     WEIL OSTEOTOMY Right 09/01/2017   Procedure: RIGHT GREAT TOE CHEVRON AND WEIL OSTEOTOMY 2ND METATARSAL;  Surgeon: [name], MD;  Location:  [facility] OR;  Service: Orthopedics;  Laterality: Right;   XI ROBOTIC ASSISTED VENTRAL HERNIA N/A 03/17/2022   Procedure: XI ROBOTIC ASSISTED VENTRAL HERNIA;  Surgeon: [name], MD;  Location: [facility];  Service: General;  Laterality: N/A;    FAMILY HISTORY: Family History  Problem Relation Age of Onset   Heart disease Mother    Cancer Sister    Cancer Paternal Grandfather    Sleep apnea Neg Hx     SOCIAL HISTORY: Social History   Socioeconomic History   Marital status: Married    Spouse name: barbra gen   Number of children: 3   Years of education: AS   Highest education level: Not on file  Occupational History   Occupation: self employed    Associate Professor: [employer]    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  Tobacco Use   Smoking status: Former    Types: Cigars    Quit date: 07/27/1988    Years since quitting: 35.4   Smokeless tobacco: Never  Vaping Use   Vaping status: Never Used  Substance and Sexual Activity   Alcohol use: Not Currently    Alcohol/week: 3.0 standard drinks of alcohol    Types: 1 Glasses of wine, 1 Cans of beer, 1 Shots of liquor per week   Drug use: Not Currently    Types: Codeine    Sexual activity: Yes  Other Topics Concern   Not on file  Social History Narrative   Lives with wife    Right handed   Drinks 1-2 cups caffeine daily   Social Drivers of Health   Financial Resource Strain: Low Risk  (10/19/2023)   Overall Financial Resource Strain (CARDIA)    Difficulty of Paying  Living Expenses: Not hard at all  Food Insecurity: Patient Unable To Answer (12/31/2023)   Hunger Vital Sign    Worried About Running Out of Food in the Last Year: Patient unable to answer    Ran Out of Food in the Last Year: Patient unable to answer  Transportation Needs: Patient Unable To Answer (12/31/2023)   PRAPARE - Transportation    Lack of Transportation (Medical): Patient unable to answer    Lack of Transportation (Non-Medical): Patient unable to answer  Physical Activity: Sufficiently Active (10/19/2023)   Exercise Vital Sign    Days of Exercise per Week: 3 days    Minutes of Exercise per Session: 50 min  Stress: No Stress Concern Present (10/19/2023)   [employer] of Occupational Health - Occupational Stress Questionnaire    Feeling of Stress : Only a little  Social Connections: Patient Unable To Answer (12/31/2023)   Social Connection and Isolation Panel    Frequency of Communication with Friends and Family: Patient unable to answer    Frequency of Social Gatherings with Friends and Family: Patient unable to answer    Attends Religious Services: Patient unable to answer    Active Member of Clubs or Organizations: Patient unable to answer    Attends Banker Meetings: Patient unable to answer    Marital Status: Patient unable to answer  Recent Concern: Social Connections - Moderately Isolated (10/19/2023)   Social Connection and Isolation Panel    Frequency of Communication with Friends and Family: Three times a week    Frequency of Social Gatherings with Friends and Family: More than three times a week    Attends Religious Services: Never    Database administrator or Organizations: No    Attends Banker Meetings: Never  Marital Status: Married  Catering manager Violence: Patient Unable To Answer (12/31/2023)   Humiliation, Afraid, Rape, and Kick questionnaire    Fear of Current or Ex-Partner: Patient unable to answer    Emotionally Abused: Patient  unable to answer    Physically Abused: Patient unable to answer    Sexually Abused: Patient unable to answer     PHYSICAL EXAM  Vitals:   01/17/24 0932  BP: 118/68  Pulse: 76  Weight: 151 lb 6.4 oz (68.7 kg)  Height: 5' 11 (1.803 m)   Body mass index is 21.12 kg/m.  Generalized: Very pleasant elderly African-American male in no acute distress   Head: normocephalic and atraumatic  Musculoskeletal: No deformity  Neurological examination  Mentation: appears mildly fatigued.  Fluent speech and language. Speaks about random topics with paranoid thoughts.  Mild hypophonia.  Mild facial masking.    04/05/2023    4:14 PM 10/12/2022   10:59 AM 10/06/2021    2:34 PM  MMSE - Mini Mental State Exam  Orientation to time 5 4 5   Orientation to Place 5 5 5   Registration 3 3 3   Attention/ Calculation 5 1 5   Recall 3 2 2   Language- name 2 objects 2 1 2   Language- repeat 1 1 1   Language- follow 3 step command 3 3 1   Language- read & follow direction 1 1 1   Write a sentence 1 1 1   Copy design 1 1 1   Total score 30 23 27    Cranial nerve II-XII: Pupils were equal round reactive to light extraocular movements were full, visual field were full on confrontational test. Facial sensation and strength were normal.  Hearing aids bilaterally.  Uvula tongue midline. head turning and shoulder shrug were normal and symmetric.Tongue protrusion into cheek strength was normal. Motor: normal bulk and tone, full strength in the BUE, BLE, .  Diminished amplitude of finger tapping on the left, intermittent left hand and leg resting tremor, mild cogwheel rigidity upon activation at the left wrist, very slight at right. Tremor improves with action and intention.  No bradykinesia bilaterally. Unable to appreciate tremor in RUE  Sensory: normal and symmetric to light touch, on the face arms and legs  Coordination: finger-nose-finger, heel-to-shin bilaterally, no dysmetria Reflexes: 1+ upper lower and symmetric  plantar responses were flexor bilaterally. Gait and Station: Rising up from seated position with assistance, use of RW d/t recent surgery. no obvious shuffling and mild unsteadiness       DIAGNOSTIC DATA (LABS, IMAGING, TESTING) - I reviewed patient records, labs, notes, testing and imaging myself where available.  Lab Results  Component Value Date   WBC 11.2 (H) 01/01/2024   HGB 10.9 (L) 01/01/2024   HCT 31.8 (L) 01/01/2024   MCV 89.8 01/01/2024   PLT 174 01/01/2024      Component Value Date/Time   NA 136 12/16/2023 1405   NA 136 01/19/2023 0919   K 4.0 12/16/2023 1405   CL 104 12/16/2023 1405   CO2 25 12/16/2023 1405   GLUCOSE 90 12/16/2023 1405   BUN 24 (H) 12/16/2023 1405   BUN 27 01/19/2023 0919   CREATININE 1.10 12/16/2023 1405   CREATININE 1.06 06/29/2014 0920   CALCIUM  9.6 12/16/2023 1405   PROT 7.3 12/16/2023 1405   PROT 7.2 01/19/2023 0919   ALBUMIN 4.2 12/16/2023 1405   ALBUMIN 4.5 01/19/2023 0919   AST 23 12/16/2023 1405   ALT 13 12/16/2023 1405   ALKPHOS 56 12/16/2023 1405   BILITOT 0.8  12/16/2023 1405   BILITOT 0.3 01/19/2023 0919   GFRNONAA >60 12/16/2023 1405   GFRAA 68 09/10/2020 1058   Lab Results  Component Value Date   CHOL 136 01/19/2023   HDL 55 01/19/2023   LDLCALC 72 01/19/2023   TRIG 37 01/19/2023   CHOLHDL 2.5 01/19/2023   Lab Results  Component Value Date   TSH 3.131 01/11/2021     ASSESSMENT AND PLAN 73 y.o. year old male  has a medical history of resting left arm and leg tremors with mild features of early left sided Parkinson's disease initially present in 2014 with associated tremor, bradykinesia, gait impairment/imbalance, REM sleep behavior and cognitive impairment. Returns today due to recent back surgery on 6/6 with post op confusion/delirium.    Postop confusion -suspect due to GA, med side effects and PD, similar symptoms after prior surgery and gradually improved, hopeful current confusion with paranoia and  hallucinations will gradually resolve  - Recommend continuation of Seroquel  25/50 for now, can consider increasing if paranoia and hallucinations persist over the next week but do suspect he is having some fatigue on current dosage so hesitant to increase prematurely  2.  Parkinson's disease -Overall relatively stable -Recommend continuation of Sinemet  1.5 tab TID and Artane  2mg  0.5 tab BID -updated medication list - advised to stop verapamil  as previously recommended by cardiology as possibly contributing to orthostatic hypotension  - continue Exelon  4.5 mg BID, repeat MMSE at f/u visit  3.  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 -d/x'd 06/2023, CPAP initiated 07/2023 -advised to start CPAP therapy at facility as this can further help improve cognition/confusion     Follow-up in October as scheduled     CC:  [name], MD   I personally spent a total of 43 minutes in the care of the patient today including preparing to see the patient, getting/reviewing separately obtained history, performing a medically appropriate exam/evaluation, counseling and educating, placing orders, and documenting clinical information in the EHR. Time also spent reviewing recent hospitalization.t  Harlene Bogaert, AGNP-BC  [facility] [address] [place], [place] [address]  Phone [phone] Fax [phone] Note: This document was prepared with digital dictation and possible smart phrase technology. Any transcriptional errors that result from this process are unintentional.

## 2024-01-17 NOTE — Patient Instructions (Addendum)
 Your Plan:  Continue Sinemet  1.5 tab three times daily (around 6-7am, 12pm and 6pm) - avoid taking with meals, either 1 hour before or 2 hours after  Continue Artane  2mg  half tab twice daily   Continue Seroquel  25mg  every morning and 50mg  nightly - can consider adjusting dose if confusion and behavioral symptoms do not improve over the next couple of days  Please ensure CPAP machine is being used nightly - this will help overall recovery, daytime fatigue and residual confusion   Please see updated medication list and adjust accordingly      Follow up in October as scheduled     Thank you for coming to see us  at [facility]. I hope we have been able to provide you high quality care today.  You may receive a patient satisfaction survey over the next few weeks. We would appreciate your feedback and comments so that we may continue to improve ourselves and the health of our patients.

## 2024-01-18 ENCOUNTER — Other Ambulatory Visit (HOSPITAL_COMMUNITY): Payer: Self-pay

## 2024-01-18 ENCOUNTER — Other Ambulatory Visit: Payer: Self-pay

## 2024-01-18 MED ORDER — GEMTESA 75 MG PO TABS
75.0000 mg | ORAL_TABLET | Freq: Every day | ORAL | 11 refills | Status: DC
  Filled 2024-01-18 (×2): qty 30, 30d supply, fill #0

## 2024-01-19 ENCOUNTER — Other Ambulatory Visit (HOSPITAL_COMMUNITY): Payer: Self-pay

## 2024-01-20 ENCOUNTER — Telehealth: Payer: Self-pay

## 2024-01-20 ENCOUNTER — Ambulatory Visit: Admitting: Cardiology

## 2024-01-20 NOTE — Transitions of Care (Post Inpatient/ED Visit) (Signed)
 01/20/2024  Name: [name] MRN: [id number] DOB: [birth year]  Today's TOC FU Call Status: Today's TOC FU Call Status:: Successful TOC FU Call Completed TOC FU Call Complete Date: 01/20/24 Patient's Name and Date of Birth confirmed.  Transition Care Management Follow-up Telephone Call Date of Discharge: 01/20/24 Discharge Facility: Other [facility]) Name of Other (Non[facility]) Discharge Facility: [facility] Type of Discharge: Inpatient Admission Primary Inpatient Discharge Diagnosis:: spondylolisthesis How have you been since you were released from the hospital?: Better Any questions or concerns?: No  Items Reviewed: Did you receive and understand the discharge instructions provided?: Yes Medications obtained,verified, and reconciled?: Yes (Medications Reviewed) Any new allergies since your discharge?: No Dietary orders reviewed?: Yes Do you have support at home?: Yes People in Home [RPT]: spouse  Medications Reviewed Today: Medications Reviewed Today     Reviewed by Emmitt Pan, LPN (Licensed Practical Nurse) on 01/20/24 at 1434  Med List Status: <None>   Medication Order Taking? Sig Documenting Provider Last Dose Status Informant  acetaminophen  (TYLENOL ) 500 MG tablet [id number] Yes Take 2 tablets (1,000 mg total) by mouth every 6 (six) hours as needed for mild pain. [name], MD  Active Spouse/Significant Other  alfuzosin  (UROXATRAL ) 10 MG 24 hr tablet [id number] Yes Take 1 tablet (10 mg total) by mouth daily.   Active Spouse/Significant Other  atorvastatin  (LIPITOR) 20 MG tablet [id number] Yes Take 1 tablet (20 mg total) by mouth daily. [name], [name], MD  Active Spouse/Significant Other  carbidopa -levodopa  (SINEMET  IR) 25-100 MG tablet [id number] Yes Take 1.5 tablets by mouth 3 (three) times daily. [name], NP  Active Spouse/Significant Other  citalopram  (CELEXA ) 20 MG tablet [id number] Yes Take 1 tablet (20 mg total) by mouth at bedtime. [name], John  C, MD  Active Spouse/Significant Other  dorzolamide -timolol  (COSOPT ) 22.3-6.8 MG/ML ophthalmic solution [id number] Yes Instill 1 drop into both eyes twice a day   Active Spouse/Significant Other  FIBER ADULT GUMMIES PO [id number] Yes Take 1 tablet by mouth 2 (two) times daily. [provider]  Active Spouse/Significant Other  ibuprofen  (ADVIL ) 600 MG tablet [id number] Yes Take 1 tablet (600 mg total) by mouth 3 (three) times daily as needed. Faye Lauraine PARAS, FNP  Active Spouse/Significant Other  irbesartan  (AVAPRO ) 75 MG tablet [id number] Yes Take 1 tablet (75 mg total) by mouth every morning. [name], MD  Active Spouse/Significant Other  Multiple Vitamins-Minerals (MULTIVITAMIN ADULTS 50+ PO) [id number] Yes Take 1 tablet by mouth daily. [provider]  Active Spouse/Significant Other  Netarsudil -Latanoprost  (ROCKLATAN ) 0.02-0.005 % SOLN [id number] Yes Place 1 drop into the left eye every night at bedtime.   Active Spouse/Significant Other  nitroGLYCERIN  (NITROSTAT ) 0.4 MG SL tablet [id number] Yes Place 1 tablet (0.4 mg total) under the tongue every 5 (five) minutes as needed for chest pain. Anner Alm LELON, MD  Active Spouse/Significant Other           Med Note SOILA, [name]   Tue Dec 14, 2023 11:50 AM)    pantoprazole  (PROTONIX ) 40 MG tablet [id number] Yes Take 1 tablet (40 mg total) by mouth 2 (two) times daily. [name], MD  Active Spouse/Significant Other  QUEtiapine  (SEROQUEL ) 25 MG tablet [id number] Yes Take 1 tablet (25 mg total) by mouth every morning AND 2 tablets (50 mg total) at bedtime. [name], NP  Active   rivastigmine  (EXELON ) 4.5 MG capsule [id number] Yes Take 1 capsule (4.5 mg total) by mouth 2 (two) times daily. [name], NP  Active Spouse/Significant  Other  trihexyphenidyl  (ARTANE ) 2 MG tablet [id number] Yes Take 0.5 tablets (1 mg total) by mouth 2 (two) times daily. [name], NP  Active Spouse/Significant Other  Vibegron  (GEMTESA )  75 MG TABS [id number]  Take 1 tablet (75 mg total) by mouth daily.   Active             Home Care and Equipment/Supplies: Were Home Health Services Ordered?: NA Any new equipment or medical supplies ordered?: NA  Functional Questionnaire: Do you need assistance with bathing/showering or dressing?: No Do you need assistance with meal preparation?: No Do you need assistance with eating?: No Do you have difficulty maintaining continence: No Do you need assistance with getting out of bed/getting out of a chair/moving?: No Do you have difficulty managing or taking your medications?: Yes  Follow up appointments reviewed: PCP Follow-up appointment confirmed?: NA Specialist Hospital Follow-up appointment confirmed?: Yes Date of Specialist follow-up appointment?: 01/19/24 Follow-Up Specialty Provider:: surgeon Do you need transportation to your follow-up appointment?: No Do you understand care options if your condition(s) worsen?: Yes-patient verbalized understanding    SIGNATURE Julian Lemmings, LPN [facility] Nurse Health Advisor Direct Dial [phone]

## 2024-01-24 ENCOUNTER — Other Ambulatory Visit (HOSPITAL_COMMUNITY): Payer: Self-pay

## 2024-01-24 ENCOUNTER — Other Ambulatory Visit: Payer: Self-pay

## 2024-01-24 ENCOUNTER — Telehealth: Payer: Self-pay | Admitting: Family Medicine

## 2024-01-24 DIAGNOSIS — M48062 Spinal stenosis, lumbar region with neurogenic claudication: Secondary | ICD-10-CM | POA: Diagnosis not present

## 2024-01-24 DIAGNOSIS — H9110 Presbycusis, unspecified ear: Secondary | ICD-10-CM

## 2024-01-24 NOTE — Telephone Encounter (Signed)
 Copied from CRM [id number]. Topic: Referral - Request for Referral >> Jan 24, 2024  1:22 PM Fredrica W wrote: Did the patient discuss referral with their provider in the last year? Yes (If No - schedule appointment) (If Yes - send message)  Appointment offered? No  Type of order/referral and detailed reason for visit: Was previously referred to Audiology but was in the hospital and had surgery so did not get seen. Would like to start process over. Thank You   Preference of office, provider, location: N/A - where previous referral was sent is fine   If referral order, have you been seen by this specialty before? Yes (If Yes, this issue or another issue? When? Where? [facility] [address] was $7000 for aids so they canceled.   Can we respond through MyChart? Yes

## 2024-01-25 DIAGNOSIS — H34812 Central retinal vein occlusion, left eye, with macular edema: Secondary | ICD-10-CM | POA: Diagnosis not present

## 2024-01-25 DIAGNOSIS — H4720 Unspecified optic atrophy: Secondary | ICD-10-CM | POA: Diagnosis not present

## 2024-01-25 DIAGNOSIS — H43812 Vitreous degeneration, left eye: Secondary | ICD-10-CM | POA: Diagnosis not present

## 2024-01-25 DIAGNOSIS — H401132 Primary open-angle glaucoma, bilateral, moderate stage: Secondary | ICD-10-CM | POA: Diagnosis not present

## 2024-01-25 DIAGNOSIS — H31093 Other chorioretinal scars, bilateral: Secondary | ICD-10-CM | POA: Diagnosis not present

## 2024-01-25 DIAGNOSIS — Z961 Presence of intraocular lens: Secondary | ICD-10-CM | POA: Diagnosis not present

## 2024-01-25 DIAGNOSIS — H35372 Puckering of macula, left eye: Secondary | ICD-10-CM | POA: Diagnosis not present

## 2024-01-25 DIAGNOSIS — H34832 Tributary (branch) retinal vein occlusion, left eye, with macular edema: Secondary | ICD-10-CM | POA: Diagnosis not present

## 2024-01-25 DIAGNOSIS — H04123 Dry eye syndrome of bilateral lacrimal glands: Secondary | ICD-10-CM | POA: Diagnosis not present

## 2024-01-25 DIAGNOSIS — H18231 Secondary corneal edema, right eye: Secondary | ICD-10-CM | POA: Diagnosis not present

## 2024-01-26 ENCOUNTER — Encounter: Payer: Self-pay | Admitting: Cardiology

## 2024-01-26 ENCOUNTER — Ambulatory Visit: Attending: Cardiology | Admitting: Cardiology

## 2024-01-26 VITALS — BP 97/61 | HR 58 | Ht 71.0 in | Wt 152.6 lb

## 2024-01-26 DIAGNOSIS — I10 Essential (primary) hypertension: Secondary | ICD-10-CM | POA: Diagnosis not present

## 2024-01-26 DIAGNOSIS — Z8249 Family history of ischemic heart disease and other diseases of the circulatory system: Secondary | ICD-10-CM

## 2024-01-26 DIAGNOSIS — G903 Multi-system degeneration of the autonomic nervous system: Secondary | ICD-10-CM | POA: Diagnosis not present

## 2024-01-26 DIAGNOSIS — M5416 Radiculopathy, lumbar region: Secondary | ICD-10-CM | POA: Diagnosis not present

## 2024-01-26 DIAGNOSIS — E785 Hyperlipidemia, unspecified: Secondary | ICD-10-CM

## 2024-01-26 NOTE — Progress Notes (Signed)
 Cardiology Office Note:  .   Date:  01/30/2024  ID:  [name], DOB 1951/05/18, MRN [id number] PCP: [name], MD  [facility] Providers Cardiologist:  Alm Clay, MD     Chief Complaint  Patient presents with   Follow-up   Hospitalization Follow-up    Postop back surgery.  Still having low blood pressures.    Patient Profile: .     [name] is a somewhat thin and frail 73 y.o. male  with a PMH notable for Parkinson's Disease (and some associated dementia as well as autonomic insufficiency), HTN, HLD (statin intolerant) with family history of CAD who presents here for 67-month follow-up-postop back surgery at the request of [name], MD.  Notable PMH:  Moderate/labile HTN-now complicated by dysautonomia with labile blood pressure variations, supine hypertension and orthostatic hypotension limiting treatment HLD Aortic atherosclerosis Parkinson's disease now with more progressive limitations. Autonomic insufficiency Lumbar spine disease     [name] was last seen on May 6, 202 For Preop Cardiovascular assessment for upcoming back surgery with Dr. Burnetta.  At that time was having pretty significant blood pressure fluctuations with orthostatic hypotension.  Pending significant blood pressure drops. => Felt to be safe for surgery, Encouraged aggressive hydration including rehydration drinks; compression stockings.  Isometric exercises to increase core strength, bulging with standing and walking with walker Noted to have supine hypertension and therefore suggested not taking blood pressure recordings when lying down. Suggested permissive hypertension and conservative treatment of blood pressures perioperatively.  Subjective  Discussed the use of AI scribe software for clinical note transcription with the patient, who gave verbal consent to proceed.  History of Present Illness History of Present Illness [name] is a 73 year old  male with Parkinson's disease who presents for follow-up after surgery and medication management.  He underwent surgery on June 6th, after a preoperative clearance in May during which verapamil  was discontinued. Post-surgery, he experienced significant issues with medication management, including being administered medications he was no longer supposed to take, such as fentanyl  and oxycodone , leading to delirium. His caregiver intervened to ensure he received Seroquel , which had been effective in managing similar symptoms previously.  Following surgery, he was hospitalized for 15 days and transferred to a neuroscience unit. His caregiver reported inadequate care, including improper medication administration and lack of physical therapy, prompting him to remove him from the facility and continue care at home.  His blood pressure has been unstable, with episodes of high and low readings. He is currently on irbesartan  and Lipitor for blood pressure and cholesterol management, respectively. He experiences dizziness primarily upon standing, leading to near-syncope, particularly when transitioning from sitting to standing.  He experiences episodes of dyspnea while sitting and talking, but not when lying down or during sleep. He uses a CPAP machine at night and has no peripheral edema. His caregiver expressed significant concerns about the quality of care received during his hospital stay and subsequent rehabilitation, noting a lack of proper physical therapy and medication management.     Objective   Current Meds  Medication Sig   acetaminophen  (TYLENOL ) 500 MG tablet Take 2 tablets (1,000 mg total) by mouth every 6 (six) hours as needed for mild pain.   alfuzosin  (UROXATRAL ) 10 MG 24 hr tablet Take 1 tablet (10 mg total) by mouth daily.   atorvastatin  (LIPITOR) 20 MG tablet Take 1 tablet (20 mg total) by mouth daily.   carbidopa -levodopa  (SINEMET  IR)  25-100 MG tablet Take 1.5 tablets by mouth 3 (three)  times daily.   citalopram  (CELEXA ) 20 MG tablet Take 1 tablet (20 mg total) by mouth at bedtime.   dorzolamide -timolol  (COSOPT ) 22.3-6.8 MG/ML ophthalmic solution Instill 1 drop into both eyes twice a day   FIBER ADULT GUMMIES PO Take 1 tablet by mouth 2 (two) times daily.   ibuprofen  (ADVIL ) 600 MG tablet Take 1 tablet (600 mg total) by mouth 3 (three) times daily as needed.   Multiple Vitamins-Minerals (MULTIVITAMIN ADULTS 50+ PO) Take 1 tablet by mouth daily.   Netarsudil -Latanoprost  (ROCKLATAN ) 0.02-0.005 % SOLN Place 1 drop into the left eye every night at bedtime.   nitroGLYCERIN  (NITROSTAT ) 0.4 MG SL tablet Place 1 tablet (0.4 mg total) under the tongue every 5 (five) minutes as needed for chest pain.   pantoprazole  (PROTONIX ) 40 MG tablet Take 1 tablet (40 mg total) by mouth 2 (two) times daily.   QUEtiapine  (SEROQUEL ) 25 MG tablet Take 1 tablet (25 mg total) by mouth every morning AND 2 tablets (50 mg total) at bedtime.   rivastigmine  (EXELON ) 4.5 MG capsule Take 1 capsule (4.5 mg total) by mouth 2 (two) times daily.   []  irbesartan  (AVAPRO ) 75 MG tablet Take 1 tablet (75 mg total) by mouth every morning. => Discontinued today   Studies Reviewed: [name]       ECHO (June 2022): Normal LVEF 60 to 65%.  No RWMA.  GR 2 DD with moderate LA dilation.  Mild RV dilation but unable to assess PAP.  Normal RAP.  AOV sclerosis (mild to moderate) but no stenosis.  Risk Assessment/Calculations:          Physical Exam:   VS:  BP 97/61   Pulse (!) 58   Ht 5' 11 (1.803 m)   Wt 152 lb 9.6 oz (69.2 kg)   SpO2 97%   BMI 21.28 kg/m    Wt Readings from Last 3 Encounters:  01/26/24 152 lb 9.6 oz (69.2 kg)  01/17/24 151 lb 6.4 oz (68.7 kg)  01/11/24 142 lb 6.7 oz (64.6 kg)    GEN: Somewhat thin and frail appearing elderly gentleman; chronically ill but nontoxic.  No acute distress. NECK: No JVD; No carotid bruits CARDIAC: Normal S1, S2; RRR, no murmurs, rubs, gallops RESPIRATORY:  Clear to  auscultation without rales, wheezing or rhonchi ; nonlabored, good air movement. ABDOMEN: Soft, non-tender, non-distended EXTREMITIES:  No edema; No deformity     ASSESSMENT AND PLAN: .    Problem List Items Addressed This Visit       Cardiology Problems   Hyperlipidemia with target LDL less than 100 (Chronic)   Is currently on atorvastatin  20 mg daily.Controlled as of last study. Lab Results  Component Value Date   CHOL 136 01/19/2023   HDL 55 01/19/2023   LDLCALC 72 01/19/2023   TRIG 37 01/19/2023   CHOLHDL 2.5 01/19/2023   Would be due for labs to be rechecked.   - With no myalgias, acceptable to continue atorvastatin .      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 - Discontinue irbesartan  to prevent exacerbation of orthostatic hypotension. - Encourage wearing support socks  for orthostatic hypotension management. - Ensure adequate hydration to prevent dehydration-related blood pressure drops.      Supine hypertension (Chronic)   Ensure that 1 blood pressure check, the day is done with him either seated or standing        Other   Family history of heart disease in male family member before age 30 (Chronic)   Previous evaluation have been unrevealing.  No active angina symptoms.  Will hold off on further testing.      Lumbar radiculopathy (Chronic)   Post-surgical recovery complicated by inappropriate medication management and inadequate rehabilitation, with delirium post-anesthesia likely exacerbated by Parkinson's disease. - Ensure proper medication management to avoid  inappropriate administration. - Focus on home-based rehabilitation to regain strength and mobility. - Monitor for signs of infection or surgical complications.              Follow-Up: Return in about 6 months (around 07/28/2024) for Alternate 6 month follow-up with APP & MD.     Signed, Alm MICAEL Clay, MD, MS Alm Clay, M.D., M.S. Interventional Chartered certified accountant  Pager # [phone]

## 2024-01-26 NOTE — Patient Instructions (Signed)
 Medication Instructions:   No changes *If you need a refill on your cardiac medications before your next appointment, please call your pharmacy*   Lab Work: Not needed If you have labs (blood work) drawn today and your tests are completely normal, you will receive your results only by: MyChart Message (if you have MyChart) OR A paper copy in the mail If you have any lab test that is abnormal or we need to change your treatment, we will call you to review the results.   Testing/Procedures:  Not needed  Follow-Up: At [facility], you and your health needs are our priority.  As part of our continuing mission to provide you with exceptional heart care, we have created designated Provider Care Teams.  These Care Teams include your primary Cardiologist (physician) and Advanced Practice Providers (APPs -  Physician Assistants and Nurse Practitioners) who all work together to provide you with the care you need, when you need it.     Your next appointment:   6 month(s)  The format for your next appointment:   In Person  Provider:    Aline Door, PA-C or Damien Braver, NP ,then  Dr Anner in 12 months

## 2024-01-27 ENCOUNTER — Encounter: Payer: Self-pay | Admitting: Adult Health

## 2024-01-30 ENCOUNTER — Encounter: Payer: Self-pay | Admitting: Cardiology

## 2024-01-30 NOTE — Assessment & Plan Note (Signed)
 Ensure that 1 blood pressure check, the day is done with him either seated or standing

## 2024-01-30 NOTE — Assessment & Plan Note (Signed)
 At this point with his hypotension today and tendency for orthostatic hypotension with near syncope, with stop ARB and allow for permissive hypertension

## 2024-01-30 NOTE — Assessment & Plan Note (Signed)
 Post-surgical recovery complicated by inappropriate medication management and inadequate rehabilitation, with delirium post-anesthesia likely exacerbated by Parkinson's disease. - Ensure proper medication management to avoid inappropriate administration. - Focus on home-based rehabilitation to regain strength and mobility. - Monitor for signs of infection or surgical complications.

## 2024-01-30 NOTE — Assessment & Plan Note (Signed)
 Is currently on atorvastatin  20 mg daily.Controlled as of last study. Lab Results  Component Value Date   CHOL 136 01/19/2023   HDL 55 01/19/2023   LDLCALC 72 01/19/2023   TRIG 37 01/19/2023   CHOLHDL 2.5 01/19/2023   Would be due for labs to be rechecked.   - With no myalgias, acceptable to continue atorvastatin .

## 2024-01-30 NOTE — Assessment & Plan Note (Signed)
 Previous evaluation have been unrevealing.  No active angina symptoms.  Will hold off on further testing.

## 2024-01-30 NOTE — Assessment & Plan Note (Signed)
 Parkinson's disease causing autonomic dysregulation and orthostatic hypotension, leading to blood pressure fluctuations and dizziness upon standing. - Discontinue irbesartan  to prevent exacerbation of orthostatic hypotension. - Encourage wearing support socks for orthostatic hypotension management. - Ensure adequate hydration to prevent dehydration-related blood pressure drops.

## 2024-01-31 DIAGNOSIS — H4720 Unspecified optic atrophy: Secondary | ICD-10-CM | POA: Diagnosis not present

## 2024-01-31 DIAGNOSIS — H18231 Secondary corneal edema, right eye: Secondary | ICD-10-CM | POA: Diagnosis not present

## 2024-01-31 DIAGNOSIS — H04123 Dry eye syndrome of bilateral lacrimal glands: Secondary | ICD-10-CM | POA: Diagnosis not present

## 2024-01-31 DIAGNOSIS — H401123 Primary open-angle glaucoma, left eye, severe stage: Secondary | ICD-10-CM | POA: Diagnosis not present

## 2024-01-31 DIAGNOSIS — H34832 Tributary (branch) retinal vein occlusion, left eye, with macular edema: Secondary | ICD-10-CM | POA: Diagnosis not present

## 2024-01-31 DIAGNOSIS — H538 Other visual disturbances: Secondary | ICD-10-CM | POA: Diagnosis not present

## 2024-01-31 DIAGNOSIS — H4089 Other specified glaucoma: Secondary | ICD-10-CM | POA: Diagnosis not present

## 2024-02-01 ENCOUNTER — Other Ambulatory Visit: Payer: Self-pay | Admitting: Neurology

## 2024-02-01 DIAGNOSIS — N3281 Overactive bladder: Secondary | ICD-10-CM | POA: Diagnosis not present

## 2024-02-01 MED ORDER — QUETIAPINE FUMARATE 25 MG PO TABS: 25.0000 mg | ORAL_TABLET | Freq: Every day | ORAL | Status: DC

## 2024-02-07 ENCOUNTER — Other Ambulatory Visit: Payer: Self-pay

## 2024-02-07 NOTE — Therapy (Signed)
 OUTPATIENT OCCUPATIONAL THERAPY PARKINSON'S EVALUATION  Patient Name: [name] MRN: [id number] DOB:[birth year], 73 y.o., male Today's Date: 02/08/2024  PCP: [name], MD REFERRING PROVIDER: Caleen Dirks, MD  END OF SESSION:  OT End of Session - 02/08/24 1431     Visit Number 1    Number of Visits 16    Date for OT Re-Evaluation 04/10/24    Authorization Type [employer] 2025 - Auth required    OT Start Time 1315    OT Stop Time 1404    OT Time Calculation (min) 49 min    Activity Tolerance Patient tolerated treatment well    Behavior During Therapy WFL for tasks assessed/performed          Past Medical History:  Diagnosis Date   Allergy    seasonal   Anxiety    Arthritis    BPH (benign prostatic hyperplasia)    Complication of anesthesia    Pt. stated he had a reaction that ended in him requiring urinary cath placement; hx delirium worsening parkinson symptoms   Depression    Diverticulosis    GERD (gastroesophageal reflux disease)    esophageal spasms   Glaucoma    Gout    Head injury, closed, with concussion    Hepatitis C    chronic - Has been treated with Harvoni   HLD (hyperlipidemia)    statin intolerant (Crestor  & Simvastatin ) - Taking Livalo  1mg  / week   Hypertension    Neuromuscular disorder (HCC)    Parkinson's Disease   Parkinson's disease (HCC)    Plantar fasciitis    right   PVD (peripheral vascular disease) (HCC)    With no claudication; only mild abdominal aortic atherosclerosis noted on ultrasound.   Sleep apnea    Wears CPAP nightly   Spinal stenosis of lumbar region    Thoracic ascending aortic aneurysm (HCC)    4.2 cm ascending TAA 09/2016 CT, 1 yr f/u rec   Ulcer    Past Surgical History:  Procedure Laterality Date   ANTERIOR LAT LUMBAR FUSION N/A 12/31/2023   Procedure: Prone trans-psoas interbody fusion - Lumbar three-Lumbar four - Lumbar four-Lumbar five, right facetectomy Lumbar four-five;  Surgeon: [name],  MD;  Location: [facility];  Service: Neurosurgery;  Laterality: N/A;   BUNIONECTOMY WITH WEIL OSTEOTOMY Right 11/02/2019   Procedure: Right Foot Lapidus, Modified McBride Bunionectomy,  Hallux Akin Osteotomy;  Surgeon: [name], MD;  Location: [facility];  Service: Orthopedics;  Laterality: Right;   CARDIAC CATHETERIZATION  2005   30% Cx. Dr. Lavon   cataract surgery Left 11/05/2014   COLONOSCOPY     COLONOSCOPY N/A 01/09/2021   Procedure: COLONOSCOPY;  Surgeon: [name], MD;  Location: [facility];  Service: Gastroenterology;  Laterality: N/A;   ESOPHAGOGASTRODUODENOSCOPY N/A 05/02/2015   Procedure: ESOPHAGOGASTRODUODENOSCOPY (EGD);  Surgeon: [name], MD;  Location: [facility];  Service: Endoscopy;  Laterality: N/A;   ESOPHAGOGASTRODUODENOSCOPY  05/02/2015   no source of pt chest pain endoscopically evident. small hiatal hernia.   ESOPHAGOGASTRODUODENOSCOPY (EGD) WITH PROPOFOL  N/A 01/02/2021   Procedure: ESOPHAGOGASTRODUODENOSCOPY (EGD) WITH PROPOFOL ;  Surgeon: [name], MD;  Location: [facility];  Service: Endoscopy;  Laterality: N/A;   HARDWARE REMOVAL Right 11/02/2019   Procedure: Second Metatarsal Removal of Deep Implant and Rotational Osteotomy;  Surgeon: [name], MD;  Location: [facility];  Service: Orthopedics;  Laterality: Right;   IR ANGIOGRAM SELECTIVE EACH ADDITIONAL VESSEL  01/04/2021   IR ANGIOGRAM SELECTIVE  EACH ADDITIONAL VESSEL  01/04/2021   IR ANGIOGRAM VISCERAL SELECTIVE  01/04/2021   IR US  GUIDE VASC ACCESS RIGHT  01/04/2021   LUMBAR LAMINECTOMY/DECOMPRESSION MICRODISCECTOMY N/A 07/03/2021   Procedure: Lumbar three through five decompression with lumbar three through five insitu fusion;  Surgeon: [name], MD;  Location: [facility] OR;  Service: Orthopedics;  Laterality: N/A;   LUMBAR PERCUTANEOUS PEDICLE SCREW 2 LEVEL N/A 12/31/2023   Procedure: LUMBAR PERCUTANEOUS PEDICLE SCREW LUMBAR THREE-LUMBAR FIVE;  Surgeon:  [name], MD;  Location: [facility];  Service: Neurosurgery;  Laterality: N/A;   MEMBRANE PEEL Left 03/14/2014   Procedure: MEMBRANE PEEL; ENDOLASER;  Surgeon: [name], MD;  Location: [facility];  Service: Ophthalmology;  Laterality: Left;   NM MYOVIEW  LTD  10/2015   LOW RISK. Small, fixed basal lateral defect - likely diaphragmatic attenuation. EF 69%   PARS PLANA VITRECTOMY Left 03/14/2014   Procedure: PARS PLANA VITRECTOMY WITH 25 GAUGE;  Surgeon: [name], MD;  Location: [facility] OR;  Service: Ophthalmology;  Laterality: Left;   shave  02/03/2022   angiofibroma   TONSILLECTOMY     TRANSTHORACIC ECHOCARDIOGRAM  01/07/2021   Normal EF 60 to 65%.  No R WMA.  GRII DD-moderately dilated LA..  Mildly dilated RV but normal function.  Normal RAP/CVP.  Trivial AI with mild to moderate sclerosis-no stenosis   UMBILICAL HERNIA REPAIR  2023   UPPER GASTROINTESTINAL ENDOSCOPY     WEIL OSTEOTOMY Right 09/01/2017   Procedure: RIGHT GREAT TOE CHEVRON AND WEIL OSTEOTOMY 2ND METATARSAL;  Surgeon: [name], MD;  Location: [facility];  Service: Orthopedics;  Laterality: Right;   XI ROBOTIC ASSISTED VENTRAL HERNIA N/A 03/17/2022   Procedure: XI ROBOTIC ASSISTED VENTRAL HERNIA;  Surgeon: [name], MD;  Location: [facility];  Service: General;  Laterality: N/A;   Patient Active Problem List   Diagnosis Date Noted   Malnutrition of moderate degree 01/06/2024   Spondylolisthesis at L4-L5 level 12/31/2023   Supine hypertension 12/03/2023   Parkinson's disease with neurogenic orthostatic hypotension (HCC) 12/03/2023   Preop cardiovascular exam 12/03/2023   Glaucoma 11/30/2023   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   Ventral hernia without obstruction or gangrene    Primary open angle glaucoma of left eye, mild stage 06/23/2021   Facial numbness 12/24/2020   Neovascular glaucoma, right eye, stage unspecified 12/16/2020   Lumbar radiculopathy 11/25/2020   Gastroesophageal reflux disease 09/10/2020    Dry eyes, bilateral 08/15/2020   Presbycusis of right ear 01/17/2020   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   Secondary corneal edema of right eye 12/12/2019   Ptosis of right eyelid 12/12/2019   OAB (overactive bladder) 11/29/2019   Bunion of great toe of right foot 07/14/2017   Claw toe, acquired, right 07/14/2017   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   Medication management 10/09/2016   Plantar fasciitis of right foot 02/19/2015   Depression 08/14/2014   Family history of heart disease in male family member before age 70 04/26/2014   Mild cognitive impairment 03/13/2014   Parkinsonian tremor (HCC) 02/12/2014   Hyperlipidemia with target LDL less than 100    Abdominal aortic atherosclerosis (HCC)    Moderate essential hypertension 02/25/2011   Arthropathy 02/25/2011   HAV (hallux abducto valgus) 02/25/2011    ONSET DATE: 01/21/2024 (referral date)   REFERRING DIAG: M43.16 (ICD-10-CM) - Spondylolisthesis, lumbar region  THERAPY DIAG:  Other lack of coordination  Unsteadiness on feet  Other symptoms and signs involving the nervous system  Visuospatial deficit  Attention and concentration deficit  Rationale for Evaluation and Treatment: Rehabilitation  SUBJECTIVE:   SUBJECTIVE STATEMENT: Pt/wife reports pain and walking is better since back surgery, however pt w/ general decline in function (PD symptoms exacerbated)  Pt accompanied by: CINDIE Calla)   PERTINENT HISTORY: recent back surgery (L3-5) on 12/31/23, Parkinson's disease causing autonomic dysregulation and orthostatic hypotension, leading to blood pressure fluctuations and dizziness upon standing. Lumbar radiculopathy (Chronic), PVD, HLD, spinal stenosis, BPH   Post-surgical recovery complicated by inappropriate medication management and inadequate rehabilitation, with delirium post-anesthesia likely exacerbated by Parkinson's disease.     PRECAUTIONS: Other: can take off back brace when seated or in bed and some walking in house, but wear it mostly when up. Back precautions (no BLT), no lifting over 10 lbs, orthostatic hypotension   WEIGHT BEARING RESTRICTIONS: No  PAIN:  Are you having pain? Yes: NPRS scale: 1-3/10  Pain location: back pain Pain description: fluctuates Aggravating factors: staying in one position too long Relieving factors: changing positions, OTC Meds  FALLS: Has patient fallen in last 6 months? Yes. Number of falls 1  LIVING ENVIRONMENT: Lives with: lives with their spouse Lives in: 2 story home, but lives on first floor, 3 steps to enter Has following equipment at home: Vannie - 2 wheeled, shower chair, and Grab bars  PLOF: Needs assistance with ADLs prior to surgery, enjoys music and traveling  PATIENT GOALS: improve function and speed with ADLS including handwriting  OBJECTIVE:  Note: Objective measures were completed at Evaluation unless otherwise noted.  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  MOBILITY STATUS: Independent  POSTURE COMMENTS:  increased lumbar lordosis, increased thoracic kyphosis, and posterior pelvic tilt   FUNCTIONAL OUTCOME MEASURES: Fastening/unfastening 3 buttons: 53 sec Physical performance test: PPT#2 (simulated eating) 27.21 & PPT#4 (donning/doffing jacket): 14.12 sec (w/  hospital gown)   COORDINATION: 9 Hole Peg test: Right: 31.60 sec; Left: 41.77 sec Box and Blocks:  TBA   UE ROM:  WFL   SENSATION: Fingertips get cold   COGNITION: Overall cognitive status: Impaired and decreased memory, processing speed  VISION:  Pt/family reports can only see light and images from Rt eye (? Legally blind), glaucoma and decreased vision Lt eye  OBSERVATIONS: Bradykinesia, Hypokinesia, and decline in cognition and overall function since back surgery  TREATMENT: N/A this date    PATIENT EDUCATION: Education details: unable today d/t time constraints   HOME EXERCISE PROGRAM: N/A  GOALS: Goals reviewed with patient? Yes  SHORT TERM GOALS: Target date: 03/10/24  Independent with PD specific HEP (Will need to adapt d/t current back precautions, consider adding bag ex's for ADLS) Baseline: Goal status: INITIAL  2.  Pt to write name with 90% or greater legibility with only min micrographia Baseline:  Goal status: INITIAL  3. Pt will demonstrate improved ease with feeding as evidenced by decreasing PPT#2 by 8 secs or more Baseline: 27.21 sec Goal status: INITIAL  4.  Pt will demonstrate improved ease with fastening buttons as evidenced by decreasing 3 button/unbutton time by 5 seconds or more Baseline: 53 sec Goal status: INITIAL  5.  Box & Blocks goal TBD Baseline:  Goal status: INITIAL   LONG TERM GOALS: Target date: 04/10/24  Pt will verbalize understanding of ways to prevent future PD related complications and appropriate community resources prn  Baseline:  Goal status: INITIAL  2.  Pt will verbalize understanding of ways to keep thinking skills sharp and ways to compensate for STM changes in the future  Baseline:  Goal status: INITIAL  3.  Pt will verbalize understanding of adaptive strategies to increase ease with  ADLS/IADLS   Baseline:  Goal status: INITIAL  4.  Pt will write a sentence with no significant decrease in size and maintain 90% legibility  Baseline:  Goal status: INITIAL  5.  Pt will demonstrate improved fine motor coordination for ADLs as evidenced by decreasing 9 hole peg test score for Lt hand by 6 secs  Baseline: 41.77 sec.  Goal status: INITIAL  6.  Pt will demonstrate improved fine motor coordination for ADLs as evidenced by decreasing 9 hole peg test score for Rt hand by 3 secs  Baseline: 31.60 sec Goal status: INITIAL   ASSESSMENT:  CLINICAL IMPRESSION: Patient is a 73 y.o. male who was seen today for occupational therapy evaluation for Parkinsonism and recent back surgery on 12/31/23. Pt with extensive PMH including: spinal stenosis, orthostatic hypotension, glaucoma. Pt presents today with bradykinesia, decreased coordination, slightly decreased balance, and slower/difficulty with ADLS. Pt also with back precautions Pt would benefit from skilled O.T. to address these deficits, develop PD specific HEP, and educate pt on future PD related complications in order to hopefully slow down progression of disease process.  [name]   PERFORMANCE DEFICITS: in functional skills including ADLs, IADLs, coordination, dexterity, proprioception, sensation, ROM, strength, pain, Fine motor control, Gross motor control, hearing, mobility, balance, body mechanics, endurance, decreased knowledge of use of DME, vision, and UE functional use, cognitive skills including memory and safety awareness, and psychosocial skills including coping strategies.   IMPAIRMENTS: are limiting patient from ADLs, IADLs, rest and sleep, leisure, and social participation.   COMORBIDITIES:  has co-morbidities such as recent back surgery with back precautions, orthostatic hypotension, fluctuating BP that affects occupational performance. Patient will benefit from skilled OT to address above impairments and improve overall  function.  MODIFICATION OR ASSISTANCE TO COMPLETE EVALUATION: No modification of tasks or assist necessary to complete an evaluation.  OT OCCUPATIONAL PROFILE AND HISTORY: Detailed assessment: Review of records and additional review of physical, cognitive, psychosocial history related to current functional performance.  CLINICAL DECISION MAKING: Moderate - several treatment options, min-mod task modification necessary  REHAB POTENTIAL: Good  EVALUATION COMPLEXITY: Moderate    PLAN:  OT FREQUENCY: 2x/week  OT DURATION: 8 weeks  PLANNED INTERVENTIONS: 97535 self care/ADL training, 02889 therapeutic exercise, 97530 therapeutic activity, 97112 neuromuscular re-education, 97140 manual therapy, 97113 aquatic therapy, 97035 ultrasound, 97010 moist heat, 97129 Cognitive training (first 15 min), 02869 Cognitive training(each additional 15 min), 02249 Physical Performance Testing, passive range of motion, functional mobility training, visual/perceptual remediation/compensation, energy conservation, coping strategies training, patient/family education, and DME and/or AE instructions  RECOMMENDED OTHER SERVICES: will request speech therapy referral but will wait to make speech evaluation until after pt gets new hearing aids  CONSULTED AND AGREED WITH PLAN OF CARE: Patient and family member/caregiver  PLAN FOR NEXT SESSION: Assess Box & Blocks and update goal (STG #5) prn, monitor BP (fluctuates), work on Chiropractor, ADLS (adhere to back precautions) and coordination. Bag ex's for dressing   Burnard JINNY Roads, OT 02/08/2024, 2:31 PM

## 2024-02-08 ENCOUNTER — Ambulatory Visit: Attending: Adult Health | Admitting: Physical Therapy

## 2024-02-08 ENCOUNTER — Telehealth: Payer: Self-pay | Admitting: Occupational Therapy

## 2024-02-08 ENCOUNTER — Encounter: Payer: Self-pay | Admitting: Physical Therapy

## 2024-02-08 ENCOUNTER — Ambulatory Visit: Admitting: Occupational Therapy

## 2024-02-08 VITALS — BP 141/85 | HR 60

## 2024-02-08 DIAGNOSIS — G20A2 Parkinson's disease without dyskinesia, with fluctuations: Secondary | ICD-10-CM | POA: Diagnosis not present

## 2024-02-08 DIAGNOSIS — R41842 Visuospatial deficit: Secondary | ICD-10-CM

## 2024-02-08 DIAGNOSIS — H903 Sensorineural hearing loss, bilateral: Secondary | ICD-10-CM | POA: Diagnosis not present

## 2024-02-08 DIAGNOSIS — R278 Other lack of coordination: Secondary | ICD-10-CM

## 2024-02-08 DIAGNOSIS — R41840 Attention and concentration deficit: Secondary | ICD-10-CM

## 2024-02-08 DIAGNOSIS — R2689 Other abnormalities of gait and mobility: Secondary | ICD-10-CM | POA: Diagnosis not present

## 2024-02-08 DIAGNOSIS — M6281 Muscle weakness (generalized): Secondary | ICD-10-CM | POA: Diagnosis not present

## 2024-02-08 DIAGNOSIS — R2681 Unsteadiness on feet: Secondary | ICD-10-CM | POA: Insufficient documentation

## 2024-02-08 DIAGNOSIS — R29818 Other symptoms and signs involving the nervous system: Secondary | ICD-10-CM | POA: Insufficient documentation

## 2024-02-08 NOTE — Telephone Encounter (Signed)
 Hello,   P.T. and O.T. evaluated [name] today following recent back surgery. However, he has been seen in the past and will continue to see for Parkinsonism. He could also benefit from more speech therapy. If you agree, can you please fax speech therapy referral for him to [facility] via [facility]. There may be a short delay in scheduling speech therapy evaluation d/t awaiting new hearing aids, however feel free to send the referral now.  Thank you,   Joslin Doell, OTR/L

## 2024-02-08 NOTE — Addendum Note (Signed)
 Addended by: [name] L on: 02/08/2024 03:21 PM   Modules accepted: Orders

## 2024-02-08 NOTE — Telephone Encounter (Signed)
 No problem! Order placed as requested. Please let me know if you need anything else!   Harlene, NP

## 2024-02-08 NOTE — Therapy (Signed)
 OUTPATIENT PHYSICAL THERAPY NEURO EVALUATION   Patient Name: [name] MRN: [id number] DOB:[birth year], 73 y.o., male Today's Date: 02/08/2024   PCP: [name], MD   REFERRING PROVIDER: Caleen Dirks, MD (sent to Harlene Bogaert, NP)  END OF SESSION:  PT End of Session - 02/08/24 1406     Visit Number 1    Number of Visits 13    Date for PT Re-Evaluation 04/08/24   due to potential delay in scheduling   Authorization Type [employer] Medicare    PT Start Time 1404    PT Stop Time 1444    PT Time Calculation (min) 40 min    Equipment Utilized During Treatment Gait belt    Activity Tolerance Patient tolerated treatment well    Behavior During Therapy WFL for tasks assessed/performed;Flat affect          Past Medical History:  Diagnosis Date   Allergy    seasonal   Anxiety    Arthritis    BPH (benign prostatic hyperplasia)    Complication of anesthesia    Pt. stated he had a reaction that ended in him requiring urinary cath placement; hx delirium worsening parkinson symptoms   Depression    Diverticulosis    GERD (gastroesophageal reflux disease)    esophageal spasms   Glaucoma    Gout    Head injury, closed, with concussion    Hepatitis C    chronic - Has been treated with Harvoni   HLD (hyperlipidemia)    statin intolerant (Crestor  & Simvastatin ) - Taking Livalo  1mg  / week   Hypertension    Neuromuscular disorder (HCC)    Parkinson's Disease   Parkinson's disease (HCC)    Plantar fasciitis    right   PVD (peripheral vascular disease) (HCC)    With no claudication; only mild abdominal aortic atherosclerosis noted on ultrasound.   Sleep apnea    Wears CPAP nightly   Spinal stenosis of lumbar region    Thoracic ascending aortic aneurysm (HCC)    4.2 cm ascending TAA 09/2016 CT, 1 yr f/u rec   Ulcer    Past Surgical History:  Procedure Laterality Date   ANTERIOR LAT LUMBAR FUSION N/A 12/31/2023   Procedure: Prone trans-psoas interbody fusion - Lumbar  three-Lumbar four - Lumbar four-Lumbar five, right facetectomy Lumbar four-five;  Surgeon: [name], MD;  Location: [facility];  Service: Neurosurgery;  Laterality: N/A;   BUNIONECTOMY WITH WEIL OSTEOTOMY Right 11/02/2019   Procedure: Right Foot Lapidus, Modified McBride Bunionectomy,  Hallux Akin Osteotomy;  Surgeon: [name], MD;  Location: [facility];  Service: Orthopedics;  Laterality: Right;   CARDIAC CATHETERIZATION  2005   30% Cx. Dr. Lavon   cataract surgery Left 11/05/2014   COLONOSCOPY     COLONOSCOPY N/A 01/09/2021   Procedure: COLONOSCOPY;  Surgeon: [name], MD;  Location: [facility];  Service: Gastroenterology;  Laterality: N/A;   ESOPHAGOGASTRODUODENOSCOPY N/A 05/02/2015   Procedure: ESOPHAGOGASTRODUODENOSCOPY (EGD);  Surgeon: [name], MD;  Location: [facility];  Service: Endoscopy;  Laterality: N/A;   ESOPHAGOGASTRODUODENOSCOPY  05/02/2015   no source of pt chest pain endoscopically evident. small hiatal hernia.   ESOPHAGOGASTRODUODENOSCOPY (EGD) WITH PROPOFOL  N/A 01/02/2021   Procedure: ESOPHAGOGASTRODUODENOSCOPY (EGD) WITH PROPOFOL ;  Surgeon: [name], MD;  Location: [facility];  Service: Endoscopy;  Laterality: N/A;   HARDWARE REMOVAL Right 11/02/2019   Procedure: Second Metatarsal Removal of Deep Implant and Rotational Osteotomy;  Surgeon: [name], MD;  Location: [facility];  Service: Orthopedics;  Laterality: Right;   IR ANGIOGRAM SELECTIVE EACH ADDITIONAL VESSEL  01/04/2021   IR ANGIOGRAM SELECTIVE EACH ADDITIONAL VESSEL  01/04/2021   IR ANGIOGRAM VISCERAL SELECTIVE  01/04/2021   IR US  GUIDE VASC ACCESS RIGHT  01/04/2021   LUMBAR LAMINECTOMY/DECOMPRESSION MICRODISCECTOMY N/A 07/03/2021   Procedure: Lumbar three through five decompression with lumbar three through five insitu fusion;  Surgeon: [name], MD;  Location: [facility];  Service: Orthopedics;  Laterality: N/A;   LUMBAR PERCUTANEOUS PEDICLE SCREW 2  LEVEL N/A 12/31/2023   Procedure: LUMBAR PERCUTANEOUS PEDICLE SCREW LUMBAR THREE-LUMBAR FIVE;  Surgeon: [name], MD;  Location: [facility];  Service: Neurosurgery;  Laterality: N/A;   MEMBRANE PEEL Left 03/14/2014   Procedure: MEMBRANE PEEL; ENDOLASER;  Surgeon: [name], MD;  Location: [facility];  Service: Ophthalmology;  Laterality: Left;   NM MYOVIEW  LTD  10/2015   LOW RISK. Small, fixed basal lateral defect - likely diaphragmatic attenuation. EF 69%   PARS PLANA VITRECTOMY Left 03/14/2014   Procedure: PARS PLANA VITRECTOMY WITH 25 GAUGE;  Surgeon: [name], MD;  Location: [facility] OR;  Service: Ophthalmology;  Laterality: Left;   shave  02/03/2022   angiofibroma   TONSILLECTOMY     TRANSTHORACIC ECHOCARDIOGRAM  01/07/2021   Normal EF 60 to 65%.  No R WMA.  GRII DD-moderately dilated LA..  Mildly dilated RV but normal function.  Normal RAP/CVP.  Trivial AI with mild to moderate sclerosis-no stenosis   UMBILICAL HERNIA REPAIR  2023   UPPER GASTROINTESTINAL ENDOSCOPY     WEIL OSTEOTOMY Right 09/01/2017   Procedure: RIGHT GREAT TOE CHEVRON AND WEIL OSTEOTOMY 2ND METATARSAL;  Surgeon: [name], MD;  Location: [facility];  Service: Orthopedics;  Laterality: Right;   XI ROBOTIC ASSISTED VENTRAL HERNIA N/A 03/17/2022   Procedure: XI ROBOTIC ASSISTED VENTRAL HERNIA;  Surgeon: [name], MD;  Location: [facility];  Service: General;  Laterality: N/A;   Patient Active Problem List   Diagnosis Date Noted   Malnutrition of moderate degree 01/06/2024   Spondylolisthesis at L4-L5 level 12/31/2023   Supine hypertension 12/03/2023   Parkinson's disease with neurogenic orthostatic hypotension (HCC) 12/03/2023   Preop cardiovascular exam 12/03/2023   Glaucoma 11/30/2023   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   Ventral hernia without obstruction or gangrene    Primary open angle glaucoma of left eye, mild stage 06/23/2021   Facial numbness 12/24/2020   Neovascular glaucoma, right eye, stage  unspecified 12/16/2020   Lumbar radiculopathy 11/25/2020   Gastroesophageal reflux disease 09/10/2020   Dry eyes, bilateral 08/15/2020   Presbycusis of right ear 01/17/2020   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   Secondary corneal edema of right eye 12/12/2019   Ptosis of right eyelid 12/12/2019   OAB (overactive bladder) 11/29/2019   Bunion of great toe of right foot 07/14/2017   Claw toe, acquired, right 07/14/2017   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   Medication management 10/09/2016   Plantar fasciitis of right foot 02/19/2015   Depression 08/14/2014   Family history of heart disease in male family member before age 28 04/26/2014   Mild cognitive impairment 03/13/2014   Parkinsonian tremor (HCC) 02/12/2014   Hyperlipidemia with target LDL less than 100    Abdominal aortic atherosclerosis (HCC)    Moderate essential hypertension 02/25/2011   Arthropathy 02/25/2011   HAV (hallux abducto valgus) 02/25/2011    ONSET DATE: 01/19/2024  REFERRING DIAG:  M43.16 (ICD-10-CM) - Spondylolisthesis, lumbar region  THERAPY DIAG:  Muscle weakness (generalized)  Other abnormalities of gait and mobility  Unsteadiness on feet  Other symptoms and signs involving the nervous system  Rationale for Evaluation and Treatment: Rehabilitation  SUBJECTIVE:                                                                                                                                                                                             SUBJECTIVE STATEMENT: Was discharged to a SNF after hospitalization from back surgery, but reports he did not get therapy there. His spouse took him out after 5 days. Not using any AD. Sometimes will get a little unsteady. Drinking more fluids now and is now off of all BP medication. Still getting some lightheadedness and is wearing compression socks. Per pt's spouse, reports he  has to have a lot of repetitive instructions. Pt asking about how to properly don back brace. Got off the Seroquel  this past week as it made him too tired. Not having any more hallucinations or delusional thoughts.   Pt accompanied by: spouse, Heron   PERTINENT HISTORY: PMH: s/p L3-5 prone trans-psoas approach for lateral interbody fusion; R L4-5 facetectomy and posterior segmental instrumentation L3-5 (12/2023). PMH significant for PD (complicated by tremor, gait impairment, REM sleep behavior and cognitive impairment ), HTN, DM, gout, glaucoma, PVD   Per note from [facility]: He underwent back surgery on 6/6 (L2-5 lateral interbody fusion with posterior instrumentation) with postop confusion and delirium requiring restraints,  improved with use of Seroquel  and discontinuing opioids and muscle relaxants, suspected confusion due to GA, PD and possibly medication side effects.  He was discharged on Seroquel  25/50.  He was discharged to [facility] rehab on 6/18, has not yet started therapies. He continues to have occasional confusion although some what improving per wife, at times with paranoia and visual hallucinations, also has been wandering and currently wearing locator band on ankle   PAIN:  Are you having pain? No and just a little ache at the top of his brace   Vitals:   02/08/24 1423 02/08/24 1424  BP: (!) 162/95 (!) 141/85  Pulse: (!) 56 60    Sitting, Standing  Pt reporting feeling a little lightheadedness   PRECAUTIONS: Back, Fall, and Other: hx of orthostatics, pt's spouse has to remind him of his precautions    FALLS: Has patient fallen in last 6 months? Yes. Number of falls 1, had a fall getting up and fell back against the window, this happened about a week or 2 before the surgery   LIVING ENVIRONMENT: Lives with: lives  with their spouse Lives in: House/apartment Stairs: Yes: Internal: 15 steps; on right going up and External: 2 on the front porch and 3 on the side steps;  can reach both, doesn't have to go up indoor steps, can live on the bottom floor  Has following equipment at home: Vannie - 2 wheeled, shower chair, and Grab bars  PLOF: Independent and Leisure: music, traveling Pt's spouse has to remind him of brace precautions for ADLs   PATIENT GOALS: Wants to be able to be more independent, stronger, and more mobile   OBJECTIVE:  Note: Objective measures were completed at Evaluation unless otherwise noted.  COGNITION: Overall cognitive status: Impaired   SENSATION: Light touch: WFL Reports numbness/tingling intermittently down front of BLE   COORDINATION: Heel to shin: slightly harder with LLE   POSTURE: rounded shoulders and forward head   LOWER EXTREMITY MMT:    MMT Right Eval Left Eval  Hip flexion 5 4+  Hip extension    Hip abduction 4 4  Hip adduction 4+ 4+  Hip internal rotation    Hip external rotation    Knee flexion 4+ 4+  Knee extension 4 -4  Ankle dorsiflexion 5 5  Ankle plantarflexion    Ankle inversion    Ankle eversion    (Blank rows = not tested)  All tested in sitting   BED MOBILITY:  Pt reports will feel stuck trying to get his legs in and out of the bed. Reviewed the log roll technique for bed mobility (PT demonstrating on the bed)  TRANSFERS: Sit to stand: SBA  Assistive device utilized: None     Stand to sit: SBA  Assistive device utilized: None      Mainly performs with UE support, but can perform without, with intermittent retropulsion  GAIT: Findings: Gait Characteristics: step through pattern, decreased arm swing- Right, decreased arm swing- Left, decreased stride length, decreased ankle dorsiflexion- Right, decreased ankle dorsiflexion- Left, Right foot flat, Left foot flat, shuffling, decreased trunk rotation, and narrow BOS, Distance walked: Clinic distances , Assistive device utilized:None, Level of assistance: SBA and CGA, and Comments: intermittent CGA during turns due to pt with narrow BOS    FUNCTIONAL TESTS:  5 times sit to stand: 21.4 seconds with no UE support, last rep pt with an episode of retropulsion  10 meter walk test: 11.5 seconds with no AD = 2.85 ft/sec     [facility] PT Assessment - 02/08/24 1440       Timed Up and Go Test   Normal TUG (seconds) 11.8    Manual TUG (seconds) 12.8    Cognitive TUG (seconds) 22   attempting retro counting by 3s, pt tends to add instead of subtract   TUG Comments pt with narrow BOS when turning with intermittent CGA  TREATMENT DATE: 02/08/24  Self-Care:   Educated what PT will address in regards to PD symptoms and recent back surgery Showed pt how to properly tighten back brace and proper alignment for wearing   PATIENT EDUCATION: Education details: Clinical findings, POC, See Self-Care Person educated: Patient and Spouse Education method: Explanation, Demonstration, and Verbal cues Education comprehension: verbalized understanding and returned demonstration  HOME EXERCISE PROGRAM: Will provide at future session   GOALS: Goals reviewed with patient? Yes  SHORT TERM GOALS: Target date: 02/29/2024  Pt will be independent with initial HEP for improved strength, balance, transfers and gait. Baseline: Goal status: INITIAL  2.  miniBEST to be assessed with LTG written.  Baseline:  Goal status: INITIAL  3.  Pt will improve 5x sit<>stand to less than or equal to 18 sec to demonstrate improved functional strength and transfer efficiency.   Baseline: 21.4 seconds with no UE support, last rep pt with an episode of retropulsion  Goal status: INITIAL    LONG TERM GOALS: Target date: 03/21/2024  Pt will verbalize understanding of local PD community resources, including fitness post DC.  Baseline:  Goal status: INITIAL  2.  miniBEST goal to be written.  Baseline:  Goal status:  INITIAL  3.  Pt will perform cog TUG in 18 seconds or less in order to demo improved dual tasking.  Baseline: 22 seconds Goal status: INITIAL  4.  Pt will improve gait speed with no AD vs. LRAD to at least 3.1 ft/sec in order to demo improved community mobility.   Baseline: 11.5 seconds with no AD = 2.85 ft/sec Goal status: INITIAL  5.  Pt will improve 5x sit<>stand to less than or equal to 16 sec to demonstrate improved functional strength and transfer efficiency.  Baseline: 21.4 seconds with no UE support, last rep pt with an episode of retropulsion  Goal status: INITIAL    ASSESSMENT:  CLINICAL IMPRESSION: Patient is a 73 year old male referred to Neuro OPPT for Parkinsonism and recent back surgery on 12/31/23. Pt with extensive PMH including: spinal stenosis, orthostatic hypotension, glaucoma, PD. The following deficits were present during the exam: impaired balance, decr strength, impaired coordination, gait abnormalities, bradykinesia, impaired posture. Based on cog TUG and 5x sit <> stand, pt is an incr risk for falls. Will perform further balance testing at future session. Pt would benefit from skilled PT to address these impairments and functional limitations to maximize functional mobility independence   OBJECTIVE IMPAIRMENTS: Abnormal gait, decreased activity tolerance, decreased balance, decreased cognition, decreased coordination, decreased endurance, decreased mobility, difficulty walking, decreased strength, decreased safety awareness, hypomobility, impaired sensation, and postural dysfunction.   ACTIVITY LIMITATIONS: carrying, lifting, bending, stairs, transfers, and locomotion level  PARTICIPATION LIMITATIONS: driving, shopping, community activity, and yard work  PERSONAL FACTORS: Age, Behavior pattern, Past/current experiences, Time since onset of injury/illness/exacerbation, and 3+ comorbidities: s/p L3-5 prone trans-psoas approach for lateral interbody fusion; R L4-5  facetectomy and posterior segmental instrumentation L3-5 (12/2023). PMH significant for PD (complicated by tremor, gait impairment, REM sleep behavior and cognitive impairment ), HTN, DM, gout, glaucoma, PVD, orthostatic hypotension  are also affecting patient's functional outcome.   REHAB POTENTIAL: Good  CLINICAL DECISION MAKING: Evolving/moderate complexity  EVALUATION COMPLEXITY: Moderate  PLAN:  PT FREQUENCY: 2x/week  PT DURATION: 8 weeks - anticipate just 6 weeks   PLANNED INTERVENTIONS: 97164- PT Re-evaluation, 97750- Physical Performance Testing, 97110-Therapeutic exercises, 97530- Therapeutic activity, [id number]- Neuromuscular re-education, 97535- Self Care, [id number]- Manual therapy, [phone]- Gait training, Patient/Family education,  Balance training, Stair training, and DME instructions  PLAN FOR NEXT SESSION: initial HEP - sit <> stands, standing PWR moves (with exception of PWR Twist), work on wider BOS and turning tasks, perform miniBEST as able, work on balance and core exercises after back surgery    [place]ffield LOISE Senate, PT, DPT 02/08/2024, 3:14 PM

## 2024-02-09 ENCOUNTER — Other Ambulatory Visit: Payer: Self-pay

## 2024-02-10 ENCOUNTER — Ambulatory Visit: Admitting: Audiologist

## 2024-02-10 ENCOUNTER — Other Ambulatory Visit: Payer: Self-pay

## 2024-02-10 ENCOUNTER — Other Ambulatory Visit (HOSPITAL_COMMUNITY): Payer: Self-pay

## 2024-02-10 DIAGNOSIS — R29818 Other symptoms and signs involving the nervous system: Secondary | ICD-10-CM | POA: Diagnosis not present

## 2024-02-10 DIAGNOSIS — G20A2 Parkinson's disease without dyskinesia, with fluctuations: Secondary | ICD-10-CM | POA: Diagnosis not present

## 2024-02-10 DIAGNOSIS — M6281 Muscle weakness (generalized): Secondary | ICD-10-CM | POA: Diagnosis not present

## 2024-02-10 DIAGNOSIS — R2681 Unsteadiness on feet: Secondary | ICD-10-CM | POA: Diagnosis not present

## 2024-02-10 DIAGNOSIS — H903 Sensorineural hearing loss, bilateral: Secondary | ICD-10-CM

## 2024-02-10 DIAGNOSIS — R2689 Other abnormalities of gait and mobility: Secondary | ICD-10-CM | POA: Diagnosis not present

## 2024-02-10 DIAGNOSIS — R278 Other lack of coordination: Secondary | ICD-10-CM | POA: Diagnosis not present

## 2024-02-10 NOTE — Procedures (Signed)
  Outpatient Audiology and [facility] [address] [place], [place]  [address] [phone]  AUDIOLOGICAL  EVALUATION  NAME: [name]     DOB:   08/13/1950      MRN: [id number]                                                                                     DATE: 02/10/2024     REFERENT: [name], MD STATUS: Outpatient DIAGNOSIS: Sensorineural Hearing Loss   History: [name] was seen for an audiological evaluation. [name] was last seen in outpatient audiology in 2023.  He was diagnosed with a presbycusis sloping hearing loss.  Hearing aids were recommended for both ears.  He got hearing aids through [employer] hearing.  The tips kept getting stuck in his ears and he has a hard time changing wax guards.  He has stopped using those.  Today he needs a repeat hearing evaluation and would like information about alternatives to the noncustom ear tip on a RIC hearing aid.   Evaluation:  Otoscopy showed a clear view of the tympanic membranes, bilaterally Tympanometry results were consistent with normal middle ear function, bilaterally Audiometric testing was completed using Conventional Audiometry techniques with insert earphones and supraural headphones. Test results are consistent with hearing test from 2023 showing normal hearing through 2K sloping to a moderate high-pitched sensorineural hearing loss bilaterally. Speech Recognition Thresholds were obtained at 15 dB HL in the right ear and at 15 dB HL in the left ear. Word Recognition Testing was completed at  40dB SL and [name] scored 100% in the left ear and 88% in the right ear  Results:  The test results were reviewed with [name] and his wife.  [name]'s hearing loss is stable compared to his 2023 test.  For hearing aids he can have a custom tip made.  [employer] likely would not offer that.  He was given a list of providers in the area who would be able to take a custom molded of his ear and develop the  silicone tip that would be a lot less likely to get stuck in the canal and be easy to put in. Audiogram printed and provided to [name].    Recommendations: Hearing aids recommended for both ears. Patient given list of local hearing aid providers and recommendations for a custom earmold. A custom tip will fit easier and not be as prone to falling off in canal.    27 minutes spent testing and counseling on results.   If you have any questions please feel free to contact me at [phone].  Lauraine Ka Stalnaker Au.D.  Audiologist   02/10/2024  2:20 PM  Cc: [name], MD

## 2024-02-11 ENCOUNTER — Other Ambulatory Visit: Payer: Self-pay

## 2024-02-11 ENCOUNTER — Other Ambulatory Visit (HOSPITAL_BASED_OUTPATIENT_CLINIC_OR_DEPARTMENT_OTHER): Payer: Self-pay

## 2024-02-15 ENCOUNTER — Other Ambulatory Visit (HOSPITAL_COMMUNITY): Payer: Self-pay

## 2024-02-17 ENCOUNTER — Other Ambulatory Visit: Payer: Self-pay

## 2024-02-17 ENCOUNTER — Ambulatory Visit: Admitting: Occupational Therapy

## 2024-02-17 DIAGNOSIS — R41842 Visuospatial deficit: Secondary | ICD-10-CM

## 2024-02-17 DIAGNOSIS — G20A2 Parkinson's disease without dyskinesia, with fluctuations: Secondary | ICD-10-CM

## 2024-02-17 DIAGNOSIS — R29818 Other symptoms and signs involving the nervous system: Secondary | ICD-10-CM

## 2024-02-17 DIAGNOSIS — R2681 Unsteadiness on feet: Secondary | ICD-10-CM | POA: Diagnosis not present

## 2024-02-17 DIAGNOSIS — H903 Sensorineural hearing loss, bilateral: Secondary | ICD-10-CM | POA: Diagnosis not present

## 2024-02-17 DIAGNOSIS — R278 Other lack of coordination: Secondary | ICD-10-CM

## 2024-02-17 DIAGNOSIS — R2689 Other abnormalities of gait and mobility: Secondary | ICD-10-CM | POA: Diagnosis not present

## 2024-02-17 DIAGNOSIS — M6281 Muscle weakness (generalized): Secondary | ICD-10-CM

## 2024-02-17 NOTE — Patient Instructions (Signed)
 Suggestions for Handwriting Changes Many people with Parkinson's notice changes in their handwriting.  Handwriting often becomes small and cramped, and can become more difficult to control when writing for longer periods of time.  This handwriting change is called micrographia.  Why does micrographia occur?  Parkinson's can cause slowing of movement, and feelings of muscle stiffness in the hands and fingers.  Loss of automatic motion also affects the easy, flowing motion of handwriting.  This can impact even simple writing tasks such as signing your name or writing a shopping list.  Attempts to write quickly without thinking about forming each letter contributes to small, cramped handwriting, and may cause the hand to develop a feeling of tightness.  How can I make writing easier?  Make a deliberate effort to form each letter.  This can be hard to do at first, but is very effective in improving size and legibility of handwriting.  Use a pen grip (round or triangular shaped rubber or foam cylinders available at stationery stores or where writing materials are found) or a larger size pen to keep your hand more relaxed.  Try printing rather than writing in a cursive style. Printing causes you to pause briefly between each letter, keeping writing more legible.   Using lined paper may provide a "visual target" to keep all letters big when writing.   A ballpoint pen typically works better than felt tip or "rolling writer"/gel styles.  Rest your hand if it begins to feel "tight".  Pause briefly when you see your handwriting becoming smaller.  Avoid hurrying or trying to write long passages if you are feeling stressed or fatigued.  Practice helps!  Remind yourself to slow down, aim big, and pause often!  Perform "flicks"/PWR! Hands if your hand feels tight, your writing gets smaller, before you start writing, or if tremors increase.  Involving your team: An occupational therapist can provide  assessment and individual recommendations for improvement of your handwriting.  This handout was adapted from [email or web address] [employer]

## 2024-02-17 NOTE — Therapy (Signed)
 OUTPATIENT OCCUPATIONAL THERAPY PARKINSON'S TREATMENT  Patient Name: [name] MRN: [id number] DOB:Jan 06, 1951, 73 y.o., male Today's Date: 02/17/2024  PCP: [name], MD REFERRING PROVIDER: Caleen Dirks, MD  END OF SESSION:  OT End of Session - 02/17/24 1357     Visit Number 2    Number of Visits 16    Date for OT Re-Evaluation 04/10/24    Authorization Type [employer] 2025 - Auth required    OT Start Time 1401    OT Stop Time 1441    OT Time Calculation (min) 40 min    Activity Tolerance Patient tolerated treatment well    Behavior During Therapy WFL for tasks assessed/performed         Past Medical History:  Diagnosis Date   Allergy    seasonal   Anxiety    Arthritis    BPH (benign prostatic hyperplasia)    Complication of anesthesia    Pt. stated he had a reaction that ended in him requiring urinary cath placement; hx delirium worsening parkinson symptoms   Depression    Diverticulosis    GERD (gastroesophageal reflux disease)    esophageal spasms   Glaucoma    Gout    Head injury, closed, with concussion    Hepatitis C    chronic - Has been treated with Harvoni   HLD (hyperlipidemia)    statin intolerant (Crestor  & Simvastatin ) - Taking Livalo  1mg  / week   Hypertension    Neuromuscular disorder (HCC)    Parkinson's Disease   Parkinson's disease (HCC)    Plantar fasciitis    right   PVD (peripheral vascular disease) (HCC)    With no claudication; only mild abdominal aortic atherosclerosis noted on ultrasound.   Sleep apnea    Wears CPAP nightly   Spinal stenosis of lumbar region    Thoracic ascending aortic aneurysm (HCC)    4.2 cm ascending TAA 09/2016 CT, 1 yr f/u rec   Ulcer    Past Surgical History:  Procedure Laterality Date   ANTERIOR LAT LUMBAR FUSION N/A 12/31/2023   Procedure: Prone trans-psoas interbody fusion - Lumbar three-Lumbar four - Lumbar four-Lumbar five, right facetectomy Lumbar four-five;  Surgeon: [name], MD;   Location: [facility];  Service: Neurosurgery;  Laterality: N/A;   BUNIONECTOMY WITH WEIL OSTEOTOMY Right 11/02/2019   Procedure: Right Foot Lapidus, Modified McBride Bunionectomy,  Hallux Akin Osteotomy;  Surgeon: [name], MD;  Location: [facility];  Service: Orthopedics;  Laterality: Right;   CARDIAC CATHETERIZATION  2005   30% Cx. Dr. Lavon   cataract surgery Left 11/05/2014   COLONOSCOPY     COLONOSCOPY N/A 01/09/2021   Procedure: COLONOSCOPY;  Surgeon: [name], MD;  Location: [facility];  Service: Gastroenterology;  Laterality: N/A;   ESOPHAGOGASTRODUODENOSCOPY N/A 05/02/2015   Procedure: ESOPHAGOGASTRODUODENOSCOPY (EGD);  Surgeon: [name], MD;  Location: [facility];  Service: Endoscopy;  Laterality: N/A;   ESOPHAGOGASTRODUODENOSCOPY  05/02/2015   no source of pt chest pain endoscopically evident. small hiatal hernia.   ESOPHAGOGASTRODUODENOSCOPY (EGD) WITH PROPOFOL  N/A 01/02/2021   Procedure: ESOPHAGOGASTRODUODENOSCOPY (EGD) WITH PROPOFOL ;  Surgeon: [name], MD;  Location: [facility];  Service: Endoscopy;  Laterality: N/A;   HARDWARE REMOVAL Right 11/02/2019   Procedure: Second Metatarsal Removal of Deep Implant and Rotational Osteotomy;  Surgeon: [name], MD;  Location:  S[facility]ERY [facility];  Service: Orthopedics;  Laterality: Right;   IR ANGIOGRAM SELECTIVE EACH ADDITIONAL VESSEL  01/04/2021   IR ANGIOGRAM SELECTIVE EACH  ADDITIONAL VESSEL  01/04/2021   IR ANGIOGRAM VISCERAL SELECTIVE  01/04/2021   IR US  GUIDE VASC ACCESS RIGHT  01/04/2021   LUMBAR LAMINECTOMY/DECOMPRESSION MICRODISCECTOMY N/A 07/03/2021   Procedure: Lumbar three through five decompression with lumbar three through five insitu fusion;  Surgeon: [name], MD;  Location: [facility] OR;  Service: Orthopedics;  Laterality: N/A;   LUMBAR PERCUTANEOUS PEDICLE SCREW 2 LEVEL N/A 12/31/2023   Procedure: LUMBAR PERCUTANEOUS PEDICLE SCREW LUMBAR THREE-LUMBAR FIVE;  Surgeon: [name], MD;  Location: [facility];  Service: Neurosurgery;  Laterality: N/A;   MEMBRANE PEEL Left 03/14/2014   Procedure: MEMBRANE PEEL; ENDOLASER;  Surgeon: [name], MD;  Location: [facility];  Service: Ophthalmology;  Laterality: Left;   NM MYOVIEW  LTD  10/2015   LOW RISK. Small, fixed basal lateral defect - likely diaphragmatic attenuation. EF 69%   PARS PLANA VITRECTOMY Left 03/14/2014   Procedure: PARS PLANA VITRECTOMY WITH 25 GAUGE;  Surgeon: [name], MD;  Location: [facility] OR;  Service: Ophthalmology;  Laterality: Left;   shave  02/03/2022   angiofibroma   TONSILLECTOMY     TRANSTHORACIC ECHOCARDIOGRAM  01/07/2021   Normal EF 60 to 65%.  No R WMA.  GRII DD-moderately dilated LA..  Mildly dilated RV but normal function.  Normal RAP/CVP.  Trivial AI with mild to moderate sclerosis-no stenosis   UMBILICAL HERNIA REPAIR  2023   UPPER GASTROINTESTINAL ENDOSCOPY     WEIL OSTEOTOMY Right 09/01/2017   Procedure: RIGHT GREAT TOE CHEVRON AND WEIL OSTEOTOMY 2ND METATARSAL;  Surgeon: [name], MD;  Location: [facility];  Service: Orthopedics;  Laterality: Right;   XI ROBOTIC ASSISTED VENTRAL HERNIA N/A 03/17/2022   Procedure: XI ROBOTIC ASSISTED VENTRAL HERNIA;  Surgeon: [name], MD;  Location: [facility];  Service: General;  Laterality: N/A;   Patient Active Problem List   Diagnosis Date Noted   Malnutrition of moderate degree 01/06/2024   Spondylolisthesis at L4-L5 level 12/31/2023   Supine hypertension 12/03/2023   Parkinson's disease with neurogenic orthostatic hypotension (HCC) 12/03/2023   Preop cardiovascular exam 12/03/2023   Glaucoma 11/30/2023   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   Ventral hernia without obstruction or gangrene    Primary open angle glaucoma of left eye, mild stage 06/23/2021   Facial numbness 12/24/2020   Neovascular glaucoma, right eye, stage unspecified 12/16/2020   Lumbar radiculopathy 11/25/2020   Gastroesophageal reflux disease 09/10/2020   Dry eyes,  bilateral 08/15/2020   Presbycusis of right ear 01/17/2020   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   Secondary corneal edema of right eye 12/12/2019   Ptosis of right eyelid 12/12/2019   OAB (overactive bladder) 11/29/2019   Bunion of great toe of right foot 07/14/2017   Claw toe, acquired, right 07/14/2017   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   Medication management 10/09/2016   Plantar fasciitis of right foot 02/19/2015   Depression 08/14/2014   Family history of heart disease in male family member before age 33 04/26/2014   Mild cognitive impairment 03/13/2014   Parkinsonian tremor (HCC) 02/12/2014   Hyperlipidemia with target LDL less than 100    Abdominal aortic atherosclerosis (HCC)    Moderate essential hypertension 02/25/2011   Arthropathy 02/25/2011   HAV (hallux abducto valgus) 02/25/2011   ONSET DATE: 01/21/2024 (referral date)   REFERRING DIAG: M43.16 (ICD-10-CM) - Spondylolisthesis, lumbar region  THERAPY DIAG:  Other lack of coordination  Other symptoms and signs involving the  nervous system  Visuospatial deficit  Muscle weakness (generalized)  Parkinson's disease without dyskinesia, with fluctuating manifestations (HCC)  Rationale for Evaluation and Treatment: Rehabilitation  SUBJECTIVE:   SUBJECTIVE STATEMENT: Pt reports he does not do a lot of writing and has not practiced writing recently.  Pt accompanied by: self  PERTINENT HISTORY: recent back surgery (L3-5) on 12/31/23, Parkinson's disease causing autonomic dysregulation and orthostatic hypotension, leading to blood pressure fluctuations and dizziness upon standing. Lumbar radiculopathy (Chronic), PVD, HLD, spinal stenosis, BPH   Post-surgical recovery complicated by inappropriate medication management and inadequate rehabilitation, with delirium post-anesthesia likely exacerbated by Parkinson's disease.    PRECAUTIONS:  Other: can take off back brace when seated or in bed and some walking in house, but wear it mostly when up. Back precautions (no BLT), no lifting over 10 lbs, orthostatic hypotension   WEIGHT BEARING RESTRICTIONS: No  PAIN:  Are you having pain? Yes: NPRS scale: 1-3/10  Pain location: back pain Pain description: fluctuates Aggravating factors: staying in one position too long Relieving factors: changing positions, OTC Meds  FALLS: Has patient fallen in last 6 months? Yes. Number of falls 1  LIVING ENVIRONMENT: Lives with: lives with their spouse Lives in: 2 story home, but lives on first floor, 3 steps to enter Has following equipment at home: Vannie - 2 wheeled, shower chair, and Grab bars  PLOF: Needs assistance with ADLs prior to surgery, enjoys music and traveling  PATIENT GOALS: improve function and speed with ADLS including handwriting  OBJECTIVE:  Note: Objective measures were completed at Evaluation unless otherwise noted.  BP assessed and [facility] for therapy visit.   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  MOBILITY STATUS: Independent  POSTURE COMMENTS:  increased lumbar lordosis, increased thoracic kyphosis, and posterior pelvic tilt   FUNCTIONAL OUTCOME MEASURES: Fastening/unfastening 3 buttons: 53 sec Physical performance test: PPT#2 (simulated eating) 27.21 & PPT#4 (donning/doffing  jacket): 14.12 sec (w/ hospital gown)   COORDINATION: 9 Hole Peg test: Right: 31.60 sec; Left: 41.77 sec Box and Blocks:  Right: 37 blocks; Left: 37 blocks  UE ROM:  WFL   SENSATION: Fingertips get cold   COGNITION: Overall cognitive status: Impaired and decreased memory, processing speed  VISION:  Pt/family reports can only see light and images from Rt eye (? Legally blind), glaucoma and decreased vision Lt eye  OBSERVATIONS: Bradykinesia, Hypokinesia, and decline in cognition and overall function since back surgery  TREATMENT:   Objective measures assessed as noted in Goals section to determine appropriate goal modification. Therapist reviewed updated goal with patient. OT educated pt on compensatory strategies for micrographia related to Parkinson's disease. Education was provided using the handout as noted in pt instructions titled "Suggestions for Handwriting Changes" (adapted from [email or web address]). Topics reviewed included: Causes of micrographia (slowed movement, stiffness, loss of automaticity) Strategies to improve legibility (e.g., deliberate letter formation, use of pen grips, lined paper, printing vs. cursive) Environmental and behavioral modifications (e.g., rest breaks, avoiding fatigue, using ballpoint pens) Therapeutic exercises (e.g., PWR! Hands/flicks) Pt practiced writing using lined paper and a pen with a built-up grip. Demonstrated improved letter size and spacing with verbal cues (slower pacing) and visual feedback.   PATIENT EDUCATION: Education details: Handwriting changes; updated goals/objective measures Person educated: Patient Education method: Explanation, Demonstration, Verbal cues, and Handouts Education comprehension: verbalized understanding, returned demonstration, verbal cues required, and needs further education   HOME EXERCISE  PROGRAM: 02/17/2024: handwriting changes  GOALS: Goals reviewed with patient? Yes  SHORT TERM GOALS: Target date: 03/10/24  Independent with PD specific HEP (Will need to adapt d/t current back precautions, consider adding bag ex's for ADLS) Baseline: Goal status: INITIAL  2.  Pt to write name with 90% or greater legibility with only min micrographia Baseline:  Goal status: IN PROGRESS  3. Pt will demonstrate improved ease with feeding as evidenced by decreasing PPT#2 by 8 secs or more Baseline: 27.21 sec Goal status: INITIAL  4.  Pt will demonstrate improved ease with fastening buttons as evidenced by decreasing 3 button/unbutton time by 5 seconds or more Baseline: 53 sec Goal status: INITIAL  5.  Pt will be able to place at least 3 additional blocks bilaterally with completion of Box and Blocks test. Baseline: Right: 37 blocks; Left: 37 blocks Goal status: INITIAL   LONG TERM GOALS: Target date: 04/10/24  Pt will verbalize understanding of ways to prevent future PD related complications and appropriate community resources prn  Baseline:  Goal status: INITIAL  2.  Pt will verbalize understanding of ways to keep thinking skills sharp and ways to compensate for STM changes in the future  Baseline:  Goal status: INITIAL  3.  Pt will verbalize understanding of adaptive strategies to increase ease with ADLS/IADLS   Baseline:  Goal status: INITIAL  4.  Pt will write a sentence with no significant decrease in size and maintain 90% legibility  Baseline:  Goal status: INITIAL  5.  Pt will demonstrate improved fine motor coordination for ADLs as evidenced by decreasing 9 hole peg test score for Lt hand by 6 secs  Baseline: 41.77 sec.  Goal status: INITIAL  6.  Pt will demonstrate improved fine motor coordination for ADLs as evidenced by decreasing 9 hole peg test score for Rt hand by 3 secs  Baseline: 31.60 sec Goal status: INITIAL  ASSESSMENT:  CLINICAL  IMPRESSION: Patient demonstrates good understanding of handwriting strategies with repetition and will require practice at home to maintain size and legibility. Will continue to progress towards goals including updated coordination goals.   PERFORMANCE DEFICITS: in functional skills including ADLs, IADLs, coordination, dexterity, proprioception, sensation, ROM, strength, pain, Fine motor control, Gross motor control, hearing, mobility, balance, body mechanics, endurance, decreased knowledge of use of DME, vision, and UE functional use, cognitive skills including memory and safety awareness, and psychosocial skills including coping strategies.   IMPAIRMENTS: are limiting patient from ADLs, IADLs, rest and sleep, leisure, and social participation.  COMORBIDITIES:  has co-morbidities such as recent back surgery with back precautions, orthostatic hypotension, fluctuating BP that affects occupational performance. Patient will benefit from skilled OT to address above impairments and improve overall function.  REHAB POTENTIAL: Good  PLAN:  OT FREQUENCY: 2x/week  OT DURATION: 8 weeks  PLANNED INTERVENTIONS: 97535 self care/ADL training, 02889 therapeutic exercise, 97530 therapeutic activity, 97112 neuromuscular re-education, 97140 manual therapy, 97113 aquatic therapy, 97035 ultrasound, 97010 moist heat, 97129 Cognitive training (first 15 min), 02869 Cognitive training(each additional 15 min), 02249 Physical Performance Testing, passive range of motion, functional mobility training, visual/perceptual remediation/compensation, energy conservation, coping strategies training, patient/family education, and DME and/or AE instructions  RECOMMENDED OTHER SERVICES: will request speech therapy referral but will wait to make speech evaluation until after pt gets new hearing aids  CONSULTED AND AGREED WITH PLAN OF CARE: Patient and family member/caregiver  PLAN FOR NEXT SESSION: monitor BP (fluctuates), Pt to  bring in PD binder for next session;  ADLS (adhere to back precautions) and coordination. Bag ex's for dressing (review from prior episode); vision options for phone - Jocelyn Jocelyn CHRISTELLA Manny, OT 02/17/2024, 5:56 PM

## 2024-02-22 ENCOUNTER — Ambulatory Visit: Admitting: Occupational Therapy

## 2024-02-22 DIAGNOSIS — R29818 Other symptoms and signs involving the nervous system: Secondary | ICD-10-CM | POA: Diagnosis not present

## 2024-02-22 DIAGNOSIS — R2681 Unsteadiness on feet: Secondary | ICD-10-CM | POA: Diagnosis not present

## 2024-02-22 DIAGNOSIS — G20A2 Parkinson's disease without dyskinesia, with fluctuations: Secondary | ICD-10-CM

## 2024-02-22 DIAGNOSIS — R278 Other lack of coordination: Secondary | ICD-10-CM | POA: Diagnosis not present

## 2024-02-22 DIAGNOSIS — M6281 Muscle weakness (generalized): Secondary | ICD-10-CM | POA: Diagnosis not present

## 2024-02-22 DIAGNOSIS — H903 Sensorineural hearing loss, bilateral: Secondary | ICD-10-CM | POA: Diagnosis not present

## 2024-02-22 DIAGNOSIS — R41842 Visuospatial deficit: Secondary | ICD-10-CM

## 2024-02-22 DIAGNOSIS — R2689 Other abnormalities of gait and mobility: Secondary | ICD-10-CM | POA: Diagnosis not present

## 2024-02-22 NOTE — Therapy (Signed)
 OUTPATIENT OCCUPATIONAL THERAPY PARKINSON'S TREATMENT  Patient Name: [name] MRN: [id number] DOB:11-17-1950, 72 y.o., male Today's Date: 02/22/2024  PCP: [name], MD REFERRING PROVIDER: Caleen Dirks, MD  END OF SESSION:  OT End of Session - 02/22/24 1149     Visit Number 3    Number of Visits 16    Date for OT Re-Evaluation 04/10/24    Authorization Type [employer] 2025 - Auth required    OT Start Time 1149    OT Stop Time 1229    OT Time Calculation (min) 40 min    Activity Tolerance Patient tolerated treatment well    Behavior During Therapy WFL for tasks assessed/performed         Past Medical History:  Diagnosis Date   Allergy    seasonal   Anxiety    Arthritis    BPH (benign prostatic hyperplasia)    Complication of anesthesia    Pt. stated he had a reaction that ended in him requiring urinary cath placement; hx delirium worsening parkinson symptoms   Depression    Diverticulosis    GERD (gastroesophageal reflux disease)    esophageal spasms   Glaucoma    Gout    Head injury, closed, with concussion    Hepatitis C    chronic - Has been treated with Harvoni   HLD (hyperlipidemia)    statin intolerant (Crestor  & Simvastatin ) - Taking Livalo  1mg  / week   Hypertension    Neuromuscular disorder (HCC)    Parkinson's Disease   Parkinson's disease (HCC)    Plantar fasciitis    right   PVD (peripheral vascular disease) (HCC)    With no claudication; only mild abdominal aortic atherosclerosis noted on ultrasound.   Sleep apnea    Wears CPAP nightly   Spinal stenosis of lumbar region    Thoracic ascending aortic aneurysm (HCC)    4.2 cm ascending TAA 09/2016 CT, 1 yr f/u rec   Ulcer    Past Surgical History:  Procedure Laterality Date   ANTERIOR LAT LUMBAR FUSION N/A 12/31/2023   Procedure: Prone trans-psoas interbody fusion - Lumbar three-Lumbar four - Lumbar four-Lumbar five, right facetectomy Lumbar four-five;  Surgeon: [name], MD;   Location: [facility];  Service: Neurosurgery;  Laterality: N/A;   BUNIONECTOMY WITH WEIL OSTEOTOMY Right 11/02/2019   Procedure: Right Foot Lapidus, Modified McBride Bunionectomy,  Hallux Akin Osteotomy;  Surgeon: [name], MD;  Location: [facility];  Service: Orthopedics;  Laterality: Right;   CARDIAC CATHETERIZATION  2005   30% Cx. Dr. Lavon   cataract surgery Left 11/05/2014   COLONOSCOPY     COLONOSCOPY N/A 01/09/2021   Procedure: COLONOSCOPY;  Surgeon: [name], MD;  Location: [facility];  Service: Gastroenterology;  Laterality: N/A;   ESOPHAGOGASTRODUODENOSCOPY N/A 05/02/2015   Procedure: ESOPHAGOGASTRODUODENOSCOPY (EGD);  Surgeon: [name], MD;  Location: [facility];  Service: Endoscopy;  Laterality: N/A;   ESOPHAGOGASTRODUODENOSCOPY  05/02/2015   no source of pt chest pain endoscopically evident. small hiatal hernia.   ESOPHAGOGASTRODUODENOSCOPY (EGD) WITH PROPOFOL  N/A 01/02/2021   Procedure: ESOPHAGOGASTRODUODENOSCOPY (EGD) WITH PROPOFOL ;  Surgeon: [name], MD;  Location: [facility];  Service: Endoscopy;  Laterality: N/A;   HARDWARE REMOVAL Right 11/02/2019   Procedure: Second Metatarsal Removal of Deep Implant and Rotational Osteotomy;  Surgeon: [name], MD;  Location: [facility];  Service: Orthopedics;  Laterality: Right;   IR ANGIOGRAM SELECTIVE EACH ADDITIONAL VESSEL  01/04/2021   IR ANGIOGRAM SELECTIVE EACH  ADDITIONAL VESSEL  01/04/2021   IR ANGIOGRAM VISCERAL SELECTIVE  01/04/2021   IR US  GUIDE VASC ACCESS RIGHT  01/04/2021   LUMBAR LAMINECTOMY/DECOMPRESSION MICRODISCECTOMY N/A 07/03/2021   Procedure: Lumbar three through five decompression with lumbar three through five insitu fusion;  Surgeon: [name], MD;  Location: [facility];  Service: Orthopedics;  Laterality: N/A;   LUMBAR PERCUTANEOUS PEDICLE SCREW 2 LEVEL N/A 12/31/2023   Procedure: LUMBAR PERCUTANEOUS PEDICLE SCREW LUMBAR THREE-LUMBAR FIVE;  Surgeon: [name], MD;  Location: [facility];  Service: Neurosurgery;  Laterality: N/A;   MEMBRANE PEEL Left 03/14/2014   Procedure: MEMBRANE PEEL; ENDOLASER;  Surgeon: [name], MD;  Location: [facility];  Service: Ophthalmology;  Laterality: Left;   NM MYOVIEW  LTD  10/2015   LOW RISK. Small, fixed basal lateral defect - likely diaphragmatic attenuation. EF 69%   PARS PLANA VITRECTOMY Left 03/14/2014   Procedure: PARS PLANA VITRECTOMY WITH 25 GAUGE;  Surgeon: [name], MD;  Location: [facility] OR;  Service: Ophthalmology;  Laterality: Left;   shave  02/03/2022   angiofibroma   TONSILLECTOMY     TRANSTHORACIC ECHOCARDIOGRAM  01/07/2021   Normal EF 60 to 65%.  No R WMA.  GRII DD-moderately dilated LA..  Mildly dilated RV but normal function.  Normal RAP/CVP.  Trivial AI with mild to moderate sclerosis-no stenosis   UMBILICAL HERNIA REPAIR  2023   UPPER GASTROINTESTINAL ENDOSCOPY     WEIL OSTEOTOMY Right 09/01/2017   Procedure: RIGHT GREAT TOE CHEVRON AND WEIL OSTEOTOMY 2ND METATARSAL;  Surgeon: [name], MD;  Location: [facility];  Service: Orthopedics;  Laterality: Right;   XI ROBOTIC ASSISTED VENTRAL HERNIA N/A 03/17/2022   Procedure: XI ROBOTIC ASSISTED VENTRAL HERNIA;  Surgeon: [name], MD;  Location: [facility];  Service: General;  Laterality: N/A;   Patient Active Problem List   Diagnosis Date Noted   Malnutrition of moderate degree 01/06/2024   Spondylolisthesis at L4-L5 level 12/31/2023   Supine hypertension 12/03/2023   Parkinson's disease with neurogenic orthostatic hypotension (HCC) 12/03/2023   Preop cardiovascular exam 12/03/2023   Glaucoma 11/30/2023   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   Ventral hernia without obstruction or gangrene    Primary open angle glaucoma of left eye, mild stage 06/23/2021   Facial numbness 12/24/2020   Neovascular glaucoma, right eye, stage unspecified 12/16/2020   Lumbar radiculopathy 11/25/2020   Gastroesophageal reflux disease 09/10/2020   Dry eyes,  bilateral 08/15/2020   Presbycusis of right ear 01/17/2020   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   Secondary corneal edema of right eye 12/12/2019   Ptosis of right eyelid 12/12/2019   OAB (overactive bladder) 11/29/2019   Bunion of great toe of right foot 07/14/2017   Claw toe, acquired, right 07/14/2017   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   Medication management 10/09/2016   Plantar fasciitis of right foot 02/19/2015   Depression 08/14/2014   Family history of heart disease in male family member before age 72 04/26/2014   Mild cognitive impairment 03/13/2014   Parkinsonian tremor (HCC) 02/12/2014   Hyperlipidemia with target LDL less than 100    Abdominal aortic atherosclerosis (HCC)    Moderate essential hypertension 02/25/2011   Arthropathy 02/25/2011   HAV (hallux abducto valgus) 02/25/2011   ONSET DATE: 01/21/2024 (referral date)   REFERRING DIAG: M43.16 (ICD-10-CM) - Spondylolisthesis, lumbar region  THERAPY DIAG:  Other lack of coordination  Other symptoms and signs involving the  nervous system  Muscle weakness (generalized)  Visuospatial deficit  Parkinson's disease without dyskinesia, with fluctuating manifestations (HCC)  Rationale for Evaluation and Treatment: Rehabilitation  SUBJECTIVE:   SUBJECTIVE STATEMENT: Pt reports he does not do a lot of writing and has not practiced writing recently. He has cleared by the back surgeon for the next 6 months.   Pt accompanied by: self  PERTINENT HISTORY: recent back surgery (L3-5) on 12/31/23, Parkinson's disease causing autonomic dysregulation and orthostatic hypotension, leading to blood pressure fluctuations and dizziness upon standing. Lumbar radiculopathy (Chronic), PVD, HLD, spinal stenosis, BPH   Post-surgical recovery complicated by inappropriate medication management and inadequate rehabilitation, with delirium post-anesthesia  likely exacerbated by Parkinson's disease.    PRECAUTIONS: Other: can take off back brace when seated or in bed and some walking in house, but wear it mostly when up. Back precautions (no BLT), no lifting over 10 lbs, orthostatic hypotension   WEIGHT BEARING RESTRICTIONS: No  PAIN:  Are you having pain? Yes: NPRS scale: 1-3/10  Pain location: back pain Pain description: fluctuates Aggravating factors: staying in one position too long Relieving factors: changing positions, OTC Meds  FALLS: Has patient fallen in last 6 months? Yes. Number of falls 1  LIVING ENVIRONMENT: Lives with: lives with their spouse Lives in: 2 story home, but lives on first floor, 3 steps to enter Has following equipment at home: Vannie - 2 wheeled, shower chair, and Grab bars  PLOF: Needs assistance with ADLs prior to surgery, enjoys music and traveling  PATIENT GOALS: improve function and speed with ADLS including handwriting  OBJECTIVE:  Note: Objective measures were completed at Evaluation unless otherwise noted.  BP assessed and [facility] for therapy visit.   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  MOBILITY STATUS: Independent  POSTURE COMMENTS:  increased lumbar lordosis, increased thoracic kyphosis, and posterior pelvic tilt   FUNCTIONAL OUTCOME MEASURES: Fastening/unfastening 3 buttons: 53 sec Physical performance  test: PPT#2 (simulated eating) 27.21 & PPT#4 (donning/doffing jacket): 14.12 sec (w/ hospital gown)   COORDINATION: 9 Hole Peg test: Right: 31.60 sec; Left: 41.77 sec Box and Blocks:  Right: 37 blocks; Left: 37 blocks  UE ROM:  WFL   SENSATION: Fingertips get cold   COGNITION: Overall cognitive status: Impaired and decreased memory, processing speed  VISION:  Pt/family reports can only see light and images from Rt eye (? Legally blind), glaucoma and decreased vision Lt eye  OBSERVATIONS: Bradykinesia, Hypokinesia, and decline in cognition and overall function since back surgery  TREATMENT:   OT assisted pt with organization of PD Binder to promote carryover. He was encouraged to practice handwriting and complete bag exercises until next session as noted below.  OT reviewed bag exercises including finger stretch, front pass, back pass, underfoot pass, and toss and catch. Cues for safety including standing in front of table or seat for bag toss and catch due to noted LOB and posterior lean. OT printed images to accompany written instructions due to pt's visual impairments.   PATIENT EDUCATION: Education details: PD Soil scientist; bag exercises Person educated: Patient Education method: Programmer, multimedia, Demonstration, Verbal cues, and Handouts Education comprehension: verbalized understanding, returned demonstration, verbal cues required, and needs further education   HOME EXERCISE PROGRAM: 02/17/2024: handwriting changes  GOALS: Goals reviewed with patient? Yes  SHORT TERM GOALS: Target date: 03/10/24  Independent with PD specific HEP (Will need to adapt d/t current back precautions, consider adding bag ex's for ADLS) Baseline: Goal status: INITIAL  2.  Pt to write name with 90% or greater legibility with only min micrographia Baseline:  Goal status: IN  PROGRESS  3. Pt will demonstrate improved ease with feeding as evidenced by decreasing PPT#2 by 8 secs or more Baseline: 27.21 sec Goal status: INITIAL  4.  Pt will demonstrate improved ease with fastening buttons as evidenced by decreasing 3 button/unbutton time by 5 seconds or more Baseline: 53 sec Goal status: INITIAL  5.  Pt will be able to place at least 3 additional blocks bilaterally with completion of Box and Blocks test. Baseline: Right: 37 blocks; Left: 37 blocks Goal status: INITIAL   LONG TERM GOALS: Target date: 04/10/24  Pt will verbalize understanding of ways to prevent future PD related complications and appropriate community resources prn  Baseline:  Goal status: INITIAL  2.  Pt will verbalize understanding of ways to keep thinking skills sharp and ways to compensate for STM changes in the future  Baseline:  Goal status: INITIAL  3.  Pt will verbalize understanding of adaptive strategies to increase ease with ADLS/IADLS   Baseline:  Goal status: INITIAL  4.  Pt will write a sentence with no significant decrease in size and maintain 90% legibility  Baseline:  Goal status: INITIAL  5.  Pt will demonstrate improved fine motor coordination for ADLs as evidenced by decreasing 9 hole peg test score for Lt hand by 6 secs  Baseline: 41.77 sec.  Goal status: INITIAL  6.  Pt will demonstrate improved fine motor coordination for ADLs as evidenced by decreasing 9 hole peg test score for Rt hand by 3 secs  Baseline: 31.60 sec Goal status: INITIAL  ASSESSMENT:  CLINICAL IMPRESSION: Patient requiring larger font size and images to promote accuracy and carryover of HEP given visual impairments. He will likely require further review or additional handouts for best outcome.   PERFORMANCE DEFICITS: in functional skills including ADLs, IADLs, coordination, dexterity, proprioception, sensation, ROM, strength, pain, Fine motor control, Gross motor control, hearing, mobility,  balance, body mechanics, endurance, decreased knowledge of use of DME, vision, and UE functional use, cognitive skills including memory and safety awareness, and psychosocial skills including coping strategies.   IMPAIRMENTS: are limiting patient from ADLs, IADLs, rest and sleep, leisure, and social participation.   COMORBIDITIES:  has co-morbidities such as recent back surgery with back precautions, orthostatic hypotension, fluctuating BP that affects occupational performance. Patient will benefit from skilled OT to address above impairments and improve overall function.  REHAB POTENTIAL: Good  PLAN:  OT FREQUENCY: 2x/week  OT DURATION: 8 weeks  PLANNED INTERVENTIONS: 97535 self care/ADL training, 02889 therapeutic exercise, 97530 therapeutic activity, 97112 neuromuscular re-education, 97140 manual therapy, 97113 aquatic therapy, 97035 ultrasound, 97010 moist heat, 97129 Cognitive training (first 15 min), 02869 Cognitive training(each additional 15 min), 02249 Physical Performance Testing, passive range of motion, functional mobility training, visual/perceptual remediation/compensation, energy conservation, coping strategies training, patient/family education, and DME and/or AE instructions  RECOMMENDED OTHER SERVICES: will request speech therapy referral but will wait to make speech evaluation until after pt gets new hearing aids  CONSULTED AND AGREED WITH PLAN OF CARE: Patient and family member/caregiver  PLAN FOR NEXT SESSION: monitor BP (fluctuates),  ADLS and coordination. Bag exs for dressing (new handout with pictures); vision options for phone - Jocelyn Jocelyn CHRISTELLA Manny, OT 02/22/2024, 11:50 AM

## 2024-02-24 ENCOUNTER — Other Ambulatory Visit

## 2024-02-24 ENCOUNTER — Ambulatory Visit: Admitting: Occupational Therapy

## 2024-02-24 DIAGNOSIS — G20A2 Parkinson's disease without dyskinesia, with fluctuations: Secondary | ICD-10-CM | POA: Diagnosis not present

## 2024-02-24 DIAGNOSIS — R29818 Other symptoms and signs involving the nervous system: Secondary | ICD-10-CM

## 2024-02-24 DIAGNOSIS — R278 Other lack of coordination: Secondary | ICD-10-CM | POA: Diagnosis not present

## 2024-02-24 DIAGNOSIS — M6281 Muscle weakness (generalized): Secondary | ICD-10-CM

## 2024-02-24 DIAGNOSIS — H903 Sensorineural hearing loss, bilateral: Secondary | ICD-10-CM | POA: Diagnosis not present

## 2024-02-24 DIAGNOSIS — R2689 Other abnormalities of gait and mobility: Secondary | ICD-10-CM | POA: Diagnosis not present

## 2024-02-24 DIAGNOSIS — R41842 Visuospatial deficit: Secondary | ICD-10-CM

## 2024-02-24 DIAGNOSIS — R2681 Unsteadiness on feet: Secondary | ICD-10-CM | POA: Diagnosis not present

## 2024-02-24 NOTE — Progress Notes (Signed)
 02/24/2024 Name: [name] MRN: [id number] DOB: [birth year]  Chief Complaint  Patient presents with   Medication Management    [name] is a 73 y.o. year old male who presented for a telephone visit.   They were referred to the pharmacist by their PCP for assistance in managing complex medication management. Patient and spouse called in requesting a medication review and cost evaluation.   Subjective:  Care Team: Primary Care Provider: [name], MD   Medication Access/Adherence  Current Pharmacy:  [name] [address] [place] [address] Phone: [phone] Fax: [phone]   Patient reports affordability concerns with their medications: No  Patient reports access/transportation concerns to their pharmacy: No  Patient reports adherence concerns with their medications:  No     Medication Management: -Reports steady improvement since being home from hospital -No discrepancies in medication list and report of home meds from patient -Seroquel  has been stopped, was being temporarily used for confusion post anesthesia   Objective:  No results found for: HGBA1C  Lab Results  Component Value Date   CREATININE 1.10 12/16/2023   BUN 24 (H) 12/16/2023   NA 136 12/16/2023   K 4.0 12/16/2023   CL 104 12/16/2023   CO2 25 12/16/2023    Lab Results  Component Value Date   CHOL 136 01/19/2023   HDL 55 01/19/2023   LDLCALC 72 01/19/2023   TRIG 37 01/19/2023   CHOLHDL 2.5 01/19/2023    Medications Reviewed Today     Reviewed by [name], RPH (Pharmacist) on 02/24/24 at 1423  Med List Status: <None>   Medication Order Taking? Sig Documenting Provider Last Dose Status Informant  acetaminophen  (TYLENOL ) 500 MG tablet [id number] Yes Take 2 tablets (1,000 mg total) by mouth every 6 (six) hours as needed for mild pain. [name], MD  Active Spouse/Significant Other  alfuzosin  (UROXATRAL ) 10 MG 24 hr  tablet [id number] Yes Take 1 tablet (10 mg total) by mouth daily.   Active Spouse/Significant Other  atorvastatin  (LIPITOR) 20 MG tablet [id number] Yes Take 1 tablet (20 mg total) by mouth daily. [name], [name], MD  Active Spouse/Significant Other  carbidopa -levodopa  (SINEMET  IR) 25-100 MG tablet [id number] Yes Take 1.5 tablets by mouth 3 (three) times daily. [name], NP  Active Spouse/Significant Other  citalopram  (CELEXA ) 20 MG tablet [id number] Yes Take 1 tablet (20 mg total) by mouth at bedtime. [name], [name], MD  Active Spouse/Significant Other  dorzolamide -timolol  (COSOPT ) 22.3-6.8 MG/ML ophthalmic solution [id number] Yes Instill 1 drop into both eyes twice a day   Active Spouse/Significant Other  FIBER ADULT GUMMIES PO [id number] Yes Take 1 tablet by mouth 2 (two) times daily. [provider]  Active Spouse/Significant Other  ibuprofen  (ADVIL ) 600 MG tablet [id number] Yes Take 1 tablet (600 mg total) by mouth 3 (three) times daily as needed. Faye Lauraine PARAS, FNP  Active Spouse/Significant Other  Multiple Vitamins-Minerals (MULTIVITAMIN ADULTS 50+ PO) [id number] Yes Take 1 tablet by mouth daily. [provider]  Active Spouse/Significant Other  Netarsudil -Latanoprost  (ROCKLATAN ) 0.02-0.005 % SOLN [id number] Yes Place 1 drop into the left eye every night at bedtime.   Active Spouse/Significant Other  nitroGLYCERIN  (NITROSTAT ) 0.4 MG SL tablet [id number]  Place 1 tablet (0.4 mg total) under the tongue every 5 (five) minutes as needed for chest pain. Anner Alm LELON, MD  Active Spouse/Significant Other           Med Note SOILA, [name]  Tue Dec 14, 2023 11:50 AM)    pantoprazole  (PROTONIX ) 40 MG tablet [id number] Yes Take 1 tablet (40 mg total) by mouth 2 (two) times daily. [name], MD  Active Spouse/Significant Other  QUEtiapine  (SEROQUEL ) 25 MG tablet [id number]  Take 1 tablet (25 mg total) by mouth at bedtime.  Patient not taking: Reported on 02/24/2024   [name], NP  Active   rivastigmine  (EXELON ) 4.5 MG capsule [id number] Yes Take 1 capsule (4.5 mg total) by mouth 2 (two) times daily. [name], NP  Active Spouse/Significant Other  trihexyphenidyl  (ARTANE ) 2 MG tablet [id number] Yes Take 0.5 tablets (1 mg total) by mouth 2 (two) times daily. [name], NP  Active Spouse/Significant Other  Vibegron  (GEMTESA  PO) [id number] Yes Take by mouth. [provider]  Active               Assessment/Plan:   Medication Management: - Currently strategy sufficient to maintain appropriate adherence to prescribed medication regimen -Patient reports a small knot on upper torso, spouse reports will call office to schedule appt to get evaluated -Continue current medication therapy      Follow Up Plan: Will reach back out if any concerns  [name], PharmD Clinical Pharmacist [phone]

## 2024-02-24 NOTE — Therapy (Addendum)
 OUTPATIENT OCCUPATIONAL THERAPY PARKINSON'S TREATMENT  Patient Name: [name] MRN: [id number] DOB:Dec 21, 1950, 73 y.o., male Today's Date: 02/24/2024  PCP: [name], MD REFERRING PROVIDER: Caleen Dirks, MD  END OF SESSION:  OT End of Session - 02/24/24 1542     Visit Number 4    Number of Visits 16    Date for OT Re-Evaluation 04/10/24    Authorization Type [employer] 2025 - Auth required    OT Start Time 1552    OT Stop Time 1635    OT Time Calculation (min) 43 min    Activity Tolerance Patient tolerated treatment well    Behavior During Therapy WFL for tasks assessed/performed         Past Medical History:  Diagnosis Date   Allergy    seasonal   Anxiety    Arthritis    BPH (benign prostatic hyperplasia)    Complication of anesthesia    Pt. stated he had a reaction that ended in him requiring urinary cath placement; hx delirium worsening parkinson symptoms   Depression    Diverticulosis    GERD (gastroesophageal reflux disease)    esophageal spasms   Glaucoma    Gout    Head injury, closed, with concussion    Hepatitis C    chronic - Has been treated with Harvoni   HLD (hyperlipidemia)    statin intolerant (Crestor  & Simvastatin ) - Taking Livalo  1mg  / week   Hypertension    Neuromuscular disorder (HCC)    Parkinson's Disease   Parkinson's disease (HCC)    Plantar fasciitis    right   PVD (peripheral vascular disease) (HCC)    With no claudication; only mild abdominal aortic atherosclerosis noted on ultrasound.   Sleep apnea    Wears CPAP nightly   Spinal stenosis of lumbar region    Thoracic ascending aortic aneurysm (HCC)    4.2 cm ascending TAA 09/2016 CT, 1 yr f/u rec   Ulcer    Past Surgical History:  Procedure Laterality Date   ANTERIOR LAT LUMBAR FUSION N/A 12/31/2023   Procedure: Prone trans-psoas interbody fusion - Lumbar three-Lumbar four - Lumbar four-Lumbar five, right facetectomy Lumbar four-five;  Surgeon: [name], MD;   Location: [facility];  Service: Neurosurgery;  Laterality: N/A;   BUNIONECTOMY WITH WEIL OSTEOTOMY Right 11/02/2019   Procedure: Right Foot Lapidus, Modified McBride Bunionectomy,  Hallux Akin Osteotomy;  Surgeon: [name], MD;  Location: [facility];  Service: Orthopedics;  Laterality: Right;   CARDIAC CATHETERIZATION  2005   30% Cx. Dr. Lavon   cataract surgery Left 11/05/2014   COLONOSCOPY     COLONOSCOPY N/A 01/09/2021   Procedure: COLONOSCOPY;  Surgeon: [name], MD;  Location: [facility];  Service: Gastroenterology;  Laterality: N/A;   ESOPHAGOGASTRODUODENOSCOPY N/A 05/02/2015   Procedure: ESOPHAGOGASTRODUODENOSCOPY (EGD);  Surgeon: [name], MD;  Location: [facility];  Service: Endoscopy;  Laterality: N/A;   ESOPHAGOGASTRODUODENOSCOPY  05/02/2015   no source of pt chest pain endoscopically evident. small hiatal hernia.   ESOPHAGOGASTRODUODENOSCOPY (EGD) WITH PROPOFOL  N/A 01/02/2021   Procedure: ESOPHAGOGASTRODUODENOSCOPY (EGD) WITH PROPOFOL ;  Surgeon: [name], MD;  Location: [facility];  Service: Endoscopy;  Laterality: N/A;   HARDWARE REMOVAL Right 11/02/2019   Procedure: Second Metatarsal Removal of Deep Implant and Rotational Osteotomy;  Surgeon: [name], MD;  Location: [facility];  Service: Orthopedics;  Laterality: Right;   IR ANGIOGRAM SELECTIVE EACH ADDITIONAL VESSEL  01/04/2021   IR ANGIOGRAM SELECTIVE EACH  ADDITIONAL VESSEL  01/04/2021   IR ANGIOGRAM VISCERAL SELECTIVE  01/04/2021   IR US  GUIDE VASC ACCESS RIGHT  01/04/2021   LUMBAR LAMINECTOMY/DECOMPRESSION MICRODISCECTOMY N/A 07/03/2021   Procedure: Lumbar three through five decompression with lumbar three through five insitu fusion;  Surgeon: [name], MD;  Location: [facility];  Service: Orthopedics;  Laterality: N/A;   LUMBAR PERCUTANEOUS PEDICLE SCREW 2 LEVEL N/A 12/31/2023   Procedure: LUMBAR PERCUTANEOUS PEDICLE SCREW LUMBAR THREE-LUMBAR FIVE;  Surgeon: [name], MD;  Location: [facility];  Service: Neurosurgery;  Laterality: N/A;   MEMBRANE PEEL Left 03/14/2014   Procedure: MEMBRANE PEEL; ENDOLASER;  Surgeon: [name], MD;  Location: [facility];  Service: Ophthalmology;  Laterality: Left;   NM MYOVIEW  LTD  10/2015   LOW RISK. Small, fixed basal lateral defect - likely diaphragmatic attenuation. EF 69%   PARS PLANA VITRECTOMY Left 03/14/2014   Procedure: PARS PLANA VITRECTOMY WITH 25 GAUGE;  Surgeon: [name], MD;  Location: [facility];  Service: Ophthalmology;  Laterality: Left;   shave  02/03/2022   angiofibroma   TONSILLECTOMY     TRANSTHORACIC ECHOCARDIOGRAM  01/07/2021   Normal EF 60 to 65%.  No R WMA.  GRII DD-moderately dilated LA..  Mildly dilated RV but normal function.  Normal RAP/CVP.  Trivial AI with mild to moderate sclerosis-no stenosis   UMBILICAL HERNIA REPAIR  2023   UPPER GASTROINTESTINAL ENDOSCOPY     WEIL OSTEOTOMY Right 09/01/2017   Procedure: RIGHT GREAT TOE CHEVRON AND WEIL OSTEOTOMY 2ND METATARSAL;  Surgeon: [name], MD;  Location: [facility];  Service: Orthopedics;  Laterality: Right;   XI ROBOTIC ASSISTED VENTRAL HERNIA N/A 03/17/2022   Procedure: XI ROBOTIC ASSISTED VENTRAL HERNIA;  Surgeon: [name], MD;  Location: [facility];  Service: General;  Laterality: N/A;   Patient Active Problem List   Diagnosis Date Noted   Malnutrition of moderate degree 01/06/2024   Spondylolisthesis at L4-L5 level 12/31/2023   Supine hypertension 12/03/2023   Parkinson's disease with neurogenic orthostatic hypotension (HCC) 12/03/2023   Preop cardiovascular exam 12/03/2023   Glaucoma 11/30/2023   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   Ventral hernia without obstruction or gangrene    Primary open angle glaucoma of left eye, mild stage 06/23/2021   Facial numbness 12/24/2020   Neovascular glaucoma, right eye, stage unspecified 12/16/2020   Lumbar radiculopathy 11/25/2020   Gastroesophageal reflux disease 09/10/2020   Dry eyes,  bilateral 08/15/2020   Presbycusis of right ear 01/17/2020   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   Secondary corneal edema of right eye 12/12/2019   Ptosis of right eyelid 12/12/2019   OAB (overactive bladder) 11/29/2019   Bunion of great toe of right foot 07/14/2017   Claw toe, acquired, right 07/14/2017   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   Medication management 10/09/2016   Plantar fasciitis of right foot 02/19/2015   Depression 08/14/2014   Family history of heart disease in male family member before age 69 04/26/2014   Mild cognitive impairment 03/13/2014   Parkinsonian tremor (HCC) 02/12/2014   Hyperlipidemia with target LDL less than 100    Abdominal aortic atherosclerosis (HCC)    Moderate essential hypertension 02/25/2011   Arthropathy 02/25/2011   HAV (hallux abducto valgus) 02/25/2011   ONSET DATE: 01/21/2024 (referral date)   REFERRING DIAG: M43.16 (ICD-10-CM) - Spondylolisthesis, lumbar region  THERAPY DIAG:  Other lack of coordination  Other symptoms and signs involving the  nervous system  Muscle weakness (generalized)  Visuospatial deficit  Parkinson's disease without dyskinesia, with fluctuating manifestations (HCC)  Rationale for Evaluation and Treatment: Rehabilitation  SUBJECTIVE:   SUBJECTIVE STATEMENT: Pt reports he cannot figure out how to use the magnifier on his phone.   Pt accompanied by: self  PERTINENT HISTORY: recent back surgery (L3-5) on 12/31/23, Parkinson's disease causing autonomic dysregulation and orthostatic hypotension, leading to blood pressure fluctuations and dizziness upon standing. Lumbar radiculopathy (Chronic), PVD, HLD, spinal stenosis, BPH   Post-surgical recovery complicated by inappropriate medication management and inadequate rehabilitation, with delirium post-anesthesia likely exacerbated by Parkinson's disease.    PRECAUTIONS: Other: can  take off back brace when seated or in bed and some walking in house, but wear it mostly when up. Back precautions (no BLT), no lifting over 10 lbs, orthostatic hypotension   WEIGHT BEARING RESTRICTIONS: No  PAIN:  Are you having pain? Yes: NPRS scale: 1-3/10  Pain location: back pain Pain description: fluctuates Aggravating factors: staying in one position too long Relieving factors: changing positions, OTC Meds  FALLS: Has patient fallen in last 6 months? Yes. Number of falls 1  LIVING ENVIRONMENT: Lives with: lives with their spouse Lives in: 2 story home, but lives on first floor, 3 steps to enter Has following equipment at home: Vannie - 2 wheeled, shower chair, and Grab bars  PLOF: Needs assistance with ADLs prior to surgery, enjoys music and traveling  PATIENT GOALS: improve function and speed with ADLS including handwriting  OBJECTIVE:  Note: Objective measures were completed at Evaluation unless otherwise noted.  BP assessed and [facility] for therapy visit.   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  MOBILITY STATUS: Independent  POSTURE COMMENTS:  increased lumbar lordosis, increased thoracic kyphosis, and posterior pelvic tilt   FUNCTIONAL OUTCOME MEASURES: Fastening/unfastening 3 buttons: 53 sec Physical performance test: PPT#2 (simulated eating) 27.21 & PPT#4 (donning/doffing jacket):  14.12 sec (w/ hospital gown)   COORDINATION: 9 Hole Peg test: Right: 31.60 sec; Left: 41.77 sec Box and Blocks:  Right: 37 blocks; Left: 37 blocks  UE ROM:  WFL   SENSATION: Fingertips get cold   COGNITION: Overall cognitive status: Impaired and decreased memory, processing speed  VISION:  Pt/family reports can only see light and images from Rt eye (? Legally blind), glaucoma and decreased vision Lt eye  OBSERVATIONS: Bradykinesia, Hypokinesia, and decline in cognition and overall function since back surgery  TREATMENT:   OT educated pt on magnifier use on his phone including use of Be My Eyes and adding additional magnifier app. Pt reports no preference with these options but does not want to attempt use with Be My Eyes.  OT reviewed behind the head bag exercise as noted in pt instructions. Cues to bring hands behind ears/to bottom of ball cap for improved ROM. Fatiguing noted with completion.  PATIENT EDUCATION: Education details: phone magnifiers; bag exercises Person educated: Patient Education method: Explanation, Demonstration, Verbal cues, and Handouts Education comprehension: verbalized understanding, returned demonstration, verbal cues required, and needs further education   HOME EXERCISE PROGRAM: 02/17/2024: handwriting changes 02/24/2024: new behind head bag exercise handout  GOALS: Goals reviewed with patient? Yes  SHORT TERM GOALS: Target date: 03/10/24  Independent with PD specific HEP (Will need to adapt d/t current back precautions, consider adding bag ex's for ADLS) Baseline: Goal status: INITIAL  2.  Pt to write name with 90% or greater legibility with only min micrographia Baseline:  Goal status: IN PROGRESS  3. Pt will demonstrate improved ease with feeding as evidenced by decreasing PPT#2 by 8 secs or more Baseline: 27.21 sec Goal status:  INITIAL  4.  Pt will demonstrate improved ease with fastening buttons as evidenced by decreasing 3 button/unbutton time by 5 seconds or more Baseline: 53 sec Goal status: INITIAL  5.  Pt will be able to place at least 3 additional blocks bilaterally with completion of Box and Blocks test. Baseline: Right: 37 blocks; Left: 37 blocks Goal status: INITIAL   LONG TERM GOALS: Target date: 04/10/24  Pt will verbalize understanding of ways to prevent future PD related complications and appropriate community resources prn  Baseline:  Goal status: INITIAL  2.  Pt will verbalize understanding of ways to keep thinking skills sharp and ways to compensate for STM changes in the future  Baseline:  Goal status: INITIAL  3.  Pt will verbalize understanding of adaptive strategies to increase ease with ADLS/IADLS   Baseline:  Goal status: INITIAL  4.  Pt will write a sentence with no significant decrease in size and maintain 90% legibility  Baseline:  Goal status: INITIAL  5.  Pt will demonstrate improved fine motor coordination for ADLs as evidenced by decreasing 9 hole peg test score for Lt hand by 6 secs  Baseline: 41.77 sec.  Goal status: INITIAL  6.  Pt will demonstrate improved fine motor coordination for ADLs as evidenced by decreasing 9 hole peg test score for Rt hand by 3 secs  Baseline: 31.60 sec Goal status: INITIAL  ASSESSMENT:  CLINICAL IMPRESSION: Patient limited by understanding with phone use and will require additional education to encourage mastery. Good return demonstration with new bag exercise illustration.   PERFORMANCE DEFICITS: in functional skills including ADLs, IADLs, coordination, dexterity, proprioception, sensation, ROM, strength, pain, Fine motor control, Gross motor control, hearing, mobility, balance, body mechanics, endurance, decreased knowledge of use of DME, vision, and UE functional use, cognitive skills including memory and safety awareness, and  psychosocial skills including coping strategies.   IMPAIRMENTS: are limiting patient from ADLs, IADLs, rest and sleep, leisure, and social participation.   COMORBIDITIES:  has co-morbidities such as recent back surgery with back precautions, orthostatic hypotension, fluctuating BP that affects occupational performance. Patient will benefit from skilled OT to address above impairments and improve overall function.  REHAB POTENTIAL: Good  PLAN:  OT FREQUENCY: 2x/week  OT DURATION: 8 weeks  PLANNED INTERVENTIONS: 97535 self care/ADL training, 02889  therapeutic exercise, 97530 therapeutic activity, 97112 neuromuscular re-education, 97140 manual therapy, [id number] aquatic therapy, 97035 ultrasound, 02989 moist heat, 97129 Cognitive training (first 15 min), 97130 Cognitive training(each additional 15 min), 97750 Physical Performance Testing, passive range of motion, functional mobility training, visual/perceptual remediation/compensation, energy conservation, coping strategies training, patient/family education, and DME and/or AE instructions  RECOMMENDED OTHER SERVICES: will request speech therapy referral but will wait to make speech evaluation until after pt gets new hearing aids  CONSULTED AND AGREED WITH PLAN OF CARE: Patient and family member/caregiver  PLAN FOR NEXT SESSION: monitor BP (fluctuates),  ADLS and coordination. Bag exs for dressing (new handout with pictures); vision options for phone - Claudell Rhody - did he practice with new mag? (Does not prefer Be My Eyes)   Jocelyn CHRISTELLA Bottom, OT 02/24/2024, 4:47 PM

## 2024-02-24 NOTE — Patient Instructions (Signed)
  Hold bag in both hands in front of you with hands/arms shoulder length apart. Move bag behind your head.  Repeat 10 times.

## 2024-02-29 ENCOUNTER — Ambulatory Visit: Admitting: Physical Therapy

## 2024-02-29 ENCOUNTER — Encounter: Admitting: Occupational Therapy

## 2024-03-02 ENCOUNTER — Other Ambulatory Visit: Payer: Self-pay

## 2024-03-02 ENCOUNTER — Ambulatory Visit: Admitting: Physical Therapy

## 2024-03-02 ENCOUNTER — Encounter: Admitting: Occupational Therapy

## 2024-03-02 ENCOUNTER — Other Ambulatory Visit (HOSPITAL_COMMUNITY): Payer: Self-pay

## 2024-03-07 ENCOUNTER — Ambulatory Visit: Admitting: Physical Therapy

## 2024-03-07 ENCOUNTER — Telehealth: Payer: Self-pay | Admitting: Adult Health

## 2024-03-07 ENCOUNTER — Ambulatory Visit: Attending: Adult Health | Admitting: Occupational Therapy

## 2024-03-07 ENCOUNTER — Encounter: Payer: Self-pay | Admitting: Physical Therapy

## 2024-03-07 VITALS — BP 110/71 | HR 60

## 2024-03-07 DIAGNOSIS — R41840 Attention and concentration deficit: Secondary | ICD-10-CM | POA: Diagnosis present

## 2024-03-07 DIAGNOSIS — R278 Other lack of coordination: Secondary | ICD-10-CM | POA: Diagnosis not present

## 2024-03-07 DIAGNOSIS — R41842 Visuospatial deficit: Secondary | ICD-10-CM | POA: Diagnosis present

## 2024-03-07 DIAGNOSIS — R2681 Unsteadiness on feet: Secondary | ICD-10-CM | POA: Insufficient documentation

## 2024-03-07 DIAGNOSIS — R2689 Other abnormalities of gait and mobility: Secondary | ICD-10-CM | POA: Diagnosis not present

## 2024-03-07 DIAGNOSIS — G20A2 Parkinson's disease without dyskinesia, with fluctuations: Secondary | ICD-10-CM | POA: Insufficient documentation

## 2024-03-07 DIAGNOSIS — R29818 Other symptoms and signs involving the nervous system: Secondary | ICD-10-CM

## 2024-03-07 DIAGNOSIS — M6281 Muscle weakness (generalized): Secondary | ICD-10-CM

## 2024-03-07 NOTE — Patient Instructions (Signed)
 Sit to Stand Transfers:  Scoot out to the edge of the chair Place your feet flat on the floor, shoulder width apart.  Make sure your feet are tucked just under your knees. Lean forward (nose over toes) with momentum, and stand up tall with your best posture.  If you need to use your arms, use them as a quick boost up to stand. If you are in a low or soft chair, you can lean back and then forward up to stand, in order to get more momentum. Once you are standing, make sure you are looking ahead and standing tall.  To sit down:  Back up until you feel the chair behind your legs. Bend at you hips, reaching  Back for you chair, if needed, then slowly squat to sit down on your chair.

## 2024-03-07 NOTE — Telephone Encounter (Signed)
 Wife has called out of concern that pt's tremor has worsen and she feels he has declined.  Wife asked appointment be placed on wait list for pt to be seen earlier, she would like a call from RN for any suggestions from [place], NP

## 2024-03-07 NOTE — Patient Instructions (Signed)
(  Exercise) Monday Tuesday Wednesday Thursday Friday Saturday Sunday  PWR! Standing         PWR! Hands         PWR! Sitting         Bag Exercises         PT exercises         Handwriting         Coordination         Buttons/Jacket         ST Exercises           The goal is to complete 1 exercise every day and each exercise at least once a week.

## 2024-03-07 NOTE — Telephone Encounter (Signed)
 Spoke w/Pt wife who reports Pt tremors are worse in legs and hands and kind of moving all over. Wife reports if Pt is asked anything Pt freezes (doesn't respond or say anything) and then will ask what you said. Pt has gotten new hearing aids but still doesn't seem to retain a directive, but it is only sometimes. Reports Pt had medications in hospital during surgery that he should not have had and wife has noticed a decline since then. States his mind doesn't seem to be as good as it was prior to surgery. States Pt stares at times but is just in a daze. States Pt is using CPAP. And all medications he is supposed to take, he is taking. Wife reports Pt is taking Focus Factor an OTC medication x 1 week at 4 tabs daily. Wife stated maybe he shouldn't be taking. Advised that may be interfering with his carbidopa /levodopa  and may be causing some issues. Wife stated will stop Focus Factor and will let us  know by Monday how Pt is doing and if there is improvement.

## 2024-03-07 NOTE — Therapy (Signed)
 OUTPATIENT PHYSICAL THERAPY NEURO TREATMENT   Patient Name: [name] MRN: [id number] DOB:1950-10-03, 73 y.o., male Today's Date: 03/07/2024   PCP: [name], MD   REFERRING PROVIDER: Caleen Dirks, MD (sent to Harlene Bogaert, NP)  END OF SESSION:  PT End of Session - 03/07/24 1320     Visit Number 2    Number of Visits 13    Date for PT Re-Evaluation 04/08/24   due to potential delay in scheduling   Authorization Type [employer] Medicare    PT Start Time 1320    PT Stop Time 1402    PT Time Calculation (min) 42 min    Equipment Utilized During Treatment Gait belt    Activity Tolerance Patient tolerated treatment well    Behavior During Therapy WFL for tasks assessed/performed;Flat affect          Past Medical History:  Diagnosis Date   Allergy    seasonal   Anxiety    Arthritis    BPH (benign prostatic hyperplasia)    Complication of anesthesia    Pt. stated he had a reaction that ended in him requiring urinary cath placement; hx delirium worsening parkinson symptoms   Depression    Diverticulosis    GERD (gastroesophageal reflux disease)    esophageal spasms   Glaucoma    Gout    Head injury, closed, with concussion    Hepatitis C    chronic - Has been treated with Harvoni   HLD (hyperlipidemia)    statin intolerant (Crestor  & Simvastatin ) - Taking Livalo  1mg  / week   Hypertension    Neuromuscular disorder (HCC)    Parkinson's Disease   Parkinson's disease (HCC)    Plantar fasciitis    right   PVD (peripheral vascular disease) (HCC)    With no claudication; only mild abdominal aortic atherosclerosis noted on ultrasound.   Sleep apnea    Wears CPAP nightly   Spinal stenosis of lumbar region    Thoracic ascending aortic aneurysm (HCC)    4.2 cm ascending TAA 09/2016 CT, 1 yr f/u rec   Ulcer    Past Surgical History:  Procedure Laterality Date   ANTERIOR LAT LUMBAR FUSION N/A 12/31/2023   Procedure: Prone trans-psoas interbody fusion - Lumbar  three-Lumbar four - Lumbar four-Lumbar five, right facetectomy Lumbar four-five;  Surgeon: [name], MD;  Location: [facility];  Service: Neurosurgery;  Laterality: N/A;   BUNIONECTOMY WITH WEIL OSTEOTOMY Right 11/02/2019   Procedure: Right Foot Lapidus, Modified McBride Bunionectomy,  Hallux Akin Osteotomy;  Surgeon: [name], MD;  Location: [facility];  Service: Orthopedics;  Laterality: Right;   CARDIAC CATHETERIZATION  2005   30% Cx. Dr. Lavon   cataract surgery Left 11/05/2014   COLONOSCOPY     COLONOSCOPY N/A 01/09/2021   Procedure: COLONOSCOPY;  Surgeon: [name], MD;  Location: [facility];  Service: Gastroenterology;  Laterality: N/A;   ESOPHAGOGASTRODUODENOSCOPY N/A 05/02/2015   Procedure: ESOPHAGOGASTRODUODENOSCOPY (EGD);  Surgeon: [name], MD;  Location: [facility];  Service: Endoscopy;  Laterality: N/A;   ESOPHAGOGASTRODUODENOSCOPY  05/02/2015   no source of pt chest pain endoscopically evident. small hiatal hernia.   ESOPHAGOGASTRODUODENOSCOPY (EGD) WITH PROPOFOL  N/A 01/02/2021   Procedure: ESOPHAGOGASTRODUODENOSCOPY (EGD) WITH PROPOFOL ;  Surgeon: [name], MD;  Location: [facility];  Service: Endoscopy;  Laterality: N/A;   HARDWARE REMOVAL Right 11/02/2019   Procedure: Second Metatarsal Removal of Deep Implant and Rotational Osteotomy;  Surgeon: [name], MD;  Location: [facility];  Service: Orthopedics;  Laterality: Right;   IR ANGIOGRAM SELECTIVE EACH ADDITIONAL VESSEL  01/04/2021   IR ANGIOGRAM SELECTIVE EACH ADDITIONAL VESSEL  01/04/2021   IR ANGIOGRAM VISCERAL SELECTIVE  01/04/2021   IR US  GUIDE VASC ACCESS RIGHT  01/04/2021   LUMBAR LAMINECTOMY/DECOMPRESSION MICRODISCECTOMY N/A 07/03/2021   Procedure: Lumbar three through five decompression with lumbar three through five insitu fusion;  Surgeon: [name], MD;  Location: [facility];  Service: Orthopedics;  Laterality: N/A;   LUMBAR PERCUTANEOUS PEDICLE SCREW 2  LEVEL N/A 12/31/2023   Procedure: LUMBAR PERCUTANEOUS PEDICLE SCREW LUMBAR THREE-LUMBAR FIVE;  Surgeon: [name], MD;  Location: [facility];  Service: Neurosurgery;  Laterality: N/A;   MEMBRANE PEEL Left 03/14/2014   Procedure: MEMBRANE PEEL; ENDOLASER;  Surgeon: [name], MD;  Location: [facility];  Service: Ophthalmology;  Laterality: Left;   NM MYOVIEW  LTD  10/2015   LOW RISK. Small, fixed basal lateral defect - likely diaphragmatic attenuation. EF 69%   PARS PLANA VITRECTOMY Left 03/14/2014   Procedure: PARS PLANA VITRECTOMY WITH 25 GAUGE;  Surgeon: [name], MD;  Location: [facility];  Service: Ophthalmology;  Laterality: Left;   shave  02/03/2022   angiofibroma   TONSILLECTOMY     TRANSTHORACIC ECHOCARDIOGRAM  01/07/2021   Normal EF 60 to 65%.  No R WMA.  GRII DD-moderately dilated LA..  Mildly dilated RV but normal function.  Normal RAP/CVP.  Trivial AI with mild to moderate sclerosis-no stenosis   UMBILICAL HERNIA REPAIR  2023   UPPER GASTROINTESTINAL ENDOSCOPY     WEIL OSTEOTOMY Right 09/01/2017   Procedure: RIGHT GREAT TOE CHEVRON AND WEIL OSTEOTOMY 2ND METATARSAL;  Surgeon: [name], MD;  Location: [facility];  Service: Orthopedics;  Laterality: Right;   XI ROBOTIC ASSISTED VENTRAL HERNIA N/A 03/17/2022   Procedure: XI ROBOTIC ASSISTED VENTRAL HERNIA;  Surgeon: [name], MD;  Location: [facility];  Service: General;  Laterality: N/A;   Patient Active Problem List   Diagnosis Date Noted   Malnutrition of moderate degree 01/06/2024   Spondylolisthesis at L4-L5 level 12/31/2023   Supine hypertension 12/03/2023   Parkinson's disease with neurogenic orthostatic hypotension (HCC) 12/03/2023   Preop cardiovascular exam 12/03/2023   Glaucoma 11/30/2023   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   Ventral hernia without obstruction or gangrene    Primary open angle glaucoma of left eye, mild stage 06/23/2021   Facial numbness 12/24/2020   Neovascular glaucoma, right eye, stage  unspecified 12/16/2020   Lumbar radiculopathy 11/25/2020   Gastroesophageal reflux disease 09/10/2020   Dry eyes, bilateral 08/15/2020   Presbycusis of right ear 01/17/2020   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   Secondary corneal edema of right eye 12/12/2019   Ptosis of right eyelid 12/12/2019   OAB (overactive bladder) 11/29/2019   Bunion of great toe of right foot 07/14/2017   Claw toe, acquired, right 07/14/2017   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   Medication management 10/09/2016   Plantar fasciitis of right foot 02/19/2015   Depression 08/14/2014   Family history of heart disease in male family member before age 75 04/26/2014   Mild cognitive impairment 03/13/2014   Parkinsonian tremor (HCC) 02/12/2014   Hyperlipidemia with target LDL less than 100    Abdominal aortic atherosclerosis (HCC)    Moderate essential hypertension 02/25/2011   Arthropathy 02/25/2011   HAV (hallux abducto valgus) 02/25/2011    ONSET DATE: 01/19/2024  REFERRING DIAG:  M43.16 (ICD-10-CM) - Spondylolisthesis, lumbar region  THERAPY DIAG:  Other symptoms and signs involving the nervous system  Muscle weakness (generalized)  Unsteadiness on feet  Other abnormalities of gait and mobility  Rationale for Evaluation and Treatment: Rehabilitation  SUBJECTIVE:                                                                                                                                                                                             SUBJECTIVE STATEMENT: Saw the neurosurgeon recently and has been cleared from wearing his brace (only when needed) and cleared of his spinal precautions. Wore brace some during the day or the evening. No falls, reports BP has been doing better.   Pt accompanied by: spouse, Heron in lobby   PERTINENT HISTORY: PMH: s/p L3-5 prone trans-psoas approach for lateral interbody  fusion; R L4-5 facetectomy and posterior segmental instrumentation L3-5 (12/2023). PMH significant for PD (complicated by tremor, gait impairment, REM sleep behavior and cognitive impairment ), HTN, DM, gout, glaucoma, PVD   Per note from [facility]: He underwent back surgery on 6/6 (L2-5 lateral interbody fusion with posterior instrumentation) with postop confusion and delirium requiring restraints,  improved with use of Seroquel  and discontinuing opioids and muscle relaxants, suspected confusion due to GA, PD and possibly medication side effects.  He was discharged on Seroquel  25/50.  He was discharged to [facility] rehab on 6/18, has not yet started therapies. He continues to have occasional confusion although some what improving per wife, at times with paranoia and visual hallucinations, also has been wandering and currently wearing locator band on ankle   PAIN:  Are you having pain? No and just a little ache at the top of his brace   Vitals:   03/07/24 1344 03/07/24 1345  BP: 112/74 110/71  Pulse: (!) 56 60     Sitting, Standing  Pt reporting feeling a little lightheadedness   PRECAUTIONS: Back, Fall, and Other: hx of orthostatics, pt's spouse has to remind him of his precautions    FALLS: Has patient fallen in last 6 months? Yes. Number of falls 1, had a fall getting up and fell back against the window, this happened about a week or 2 before the surgery   LIVING ENVIRONMENT: Lives with: lives with their spouse Lives in: House/apartment Stairs: Yes: Internal: 15 steps; on right going up and External: 2 on the front porch and 3 on the side steps; can reach both, doesn't have to go up indoor steps, can live on the bottom floor  Has following equipment at home: Vannie - 2 wheeled, shower chair, and Grab bars  PLOF:  Independent and Leisure: music, traveling Pt's spouse has to remind him of brace precautions for ADLs   PATIENT GOALS: Wants to be able to be more independent, stronger,  and more mobile   OBJECTIVE:  Note: Objective measures were completed at Evaluation unless otherwise noted.  COGNITION: Overall cognitive status: Impaired   SENSATION: Light touch: WFL Reports numbness/tingling intermittently down front of BLE   COORDINATION: Heel to shin: slightly harder with LLE   POSTURE: rounded shoulders and forward head   LOWER EXTREMITY MMT:    MMT Right Eval Left Eval  Hip flexion 5 4+  Hip extension    Hip abduction 4 4  Hip adduction 4+ 4+  Hip internal rotation    Hip external rotation    Knee flexion 4+ 4+  Knee extension 4 -4  Ankle dorsiflexion 5 5  Ankle plantarflexion    Ankle inversion    Ankle eversion    (Blank rows = not tested)  All tested in sitting   BED MOBILITY:  Pt reports will feel stuck trying to get his legs in and out of the bed. Reviewed the log roll technique for bed mobility (PT demonstrating on the bed)  TRANSFERS: Sit to stand: SBA  Assistive device utilized: None     Stand to sit: SBA  Assistive device utilized: None      Mainly performs with UE support, but can perform without, with intermittent retropulsion  GAIT: Findings: Gait Characteristics: step through pattern, decreased arm swing- Right, decreased arm swing- Left, decreased stride length, decreased ankle dorsiflexion- Right, decreased ankle dorsiflexion- Left, Right foot flat, Left foot flat, shuffling, decreased trunk rotation, and narrow BOS, Distance walked: Clinic distances , Assistive device utilized:None, Level of assistance: SBA and CGA, and Comments: intermittent CGA during turns due to pt with narrow BOS   FUNCTIONAL TESTS:  5 times sit to stand: 21.4 seconds with no UE support, last rep pt with an episode of retropulsion  10 meter walk test: 11.5 seconds with no AD = 2.85 ft/sec     [facility] PT Assessment - 02/08/24 1440       Timed Up and Go Test   Normal TUG (seconds) 11.8    Manual TUG (seconds) 12.8    Cognitive TUG (seconds) 22    attempting retro counting by 3s, pt tends to add instead of subtract   TUG Comments pt with narrow BOS when turning with intermittent CGA                                                                                                                                      TREATMENT DATE: 03/07/24  Therapeutic Activity: Pt reporting that he was cleared from his back precautions and was able to ween from his TLSO. Pt initially not seeing this information in Dr. Valeda note from recent visit. PT attempted to call neurosurgery office and had to leave message with nurse  regarding pt's precautions and asking to get verbal orders or have them faxed to our clinic. Upon further digging in pt's chart able to find note in media from neurosurgeon stating that he can slowly liberalize his activities including discontinued of his post-op fusion brace from visit on 02/21/24  Vitals:   03/07/24 1344 03/07/24 1345  BP: 112/74 110/71  Pulse: (!) 56 60   Assessed BP in sitting and standing Pt with no lightheadedness today. Pt not wearing compression socks, but did educate to make sure he wears them in future sessions to help make sure that BP does not drop    NMR:  Pt performs PWR! Moves in standing position in front of mat table with chair in front of pt as needed for balance:    PWR! Up for improved posture 2 sets of 10 reps   PWR! Rock for improved weight shifting 2 sets of 10 reps - cues to look up at hands when weight shifting, one instance of needing chair for balance   Cues provided for purpose of exercise in regards to function, larger amplitude movement patterns.   Pt reporting 7/10 RPE    Performed 10 reps of sit <> stands with no UE support with focus on tucking feet back and incr forward lean, pt able to perform with no retropulsion    PATIENT EDUCATION: Education details: See therapeutic activity section above, initial HEP - sit <> stands and standing PWR Up/Rock  Person  educated: Patient Education method: Explanation, Demonstration, Verbal cues, and Handouts Education comprehension: verbalized understanding and returned demonstration  HOME EXERCISE PROGRAM: Standing PWR Up/Rock  Access Code: [email or web address] URL: [email or web address] Date: 03/07/2024 Prepared by: Sheffield Senate  Exercises - Sit to Stand  - 1-2 x daily - 4-5 x weekly - 2 sets - 10 reps  GOALS: Goals reviewed with patient? Yes  SHORT TERM GOALS: Target date: 02/29/2024  Pt will be independent with initial HEP for improved strength, balance, transfers and gait. Baseline: Goal status: INITIAL  2.  miniBEST to be assessed with LTG written.  Baseline:  Goal status: INITIAL  3.  Pt will improve 5x sit<>stand to less than or equal to 18 sec to demonstrate improved functional strength and transfer efficiency.   Baseline: 21.4 seconds with no UE support, last rep pt with an episode of retropulsion  Goal status: INITIAL    LONG TERM GOALS: Target date: 03/21/2024  Pt will verbalize understanding of local PD community resources, including fitness post DC.  Baseline:  Goal status: INITIAL  2.  miniBEST goal to be written.  Baseline:  Goal status: INITIAL  3.  Pt will perform cog TUG in 18 seconds or less in order to demo improved dual tasking.  Baseline: 22 seconds Goal status: INITIAL  4.  Pt will improve gait speed with no AD vs. LRAD to at least 3.1 ft/sec in order to demo improved community mobility.   Baseline: 11.5 seconds with no AD = 2.85 ft/sec Goal status: INITIAL  5.  Pt will improve 5x sit<>stand to less than or equal to 16 sec to demonstrate improved functional strength and transfer efficiency.  Baseline: 21.4 seconds with no UE support, last rep pt with an episode of retropulsion  Goal status: INITIAL    ASSESSMENT:  CLINICAL IMPRESSION: Pt saw neurosurgeon since he was here for PT eval and has been cleared from his back precautions and can wean  from his brace. First part of session involved PT trying to make a phone  call to get verbal orders regarding this as was not initially found in Dr. Valeda note (PT left voicemail at neurosurgeon's office). PT did eventually find instructions in media regarding weaning of his brace. Remainder of session focused on initiating HEP for sit <> stands and standing PWR Up/Rock. Pt performed well with minimal cues. Discussed at next session will also have pt's spouse come back for further education and improved carryover for home. Will continue per POC.    OBJECTIVE IMPAIRMENTS: Abnormal gait, decreased activity tolerance, decreased balance, decreased cognition, decreased coordination, decreased endurance, decreased mobility, difficulty walking, decreased strength, decreased safety awareness, hypomobility, impaired sensation, and postural dysfunction.   ACTIVITY LIMITATIONS: carrying, lifting, bending, stairs, transfers, and locomotion level  PARTICIPATION LIMITATIONS: driving, shopping, community activity, and yard work  PERSONAL FACTORS: Age, Behavior pattern, Past/current experiences, Time since onset of injury/illness/exacerbation, and 3+ comorbidities: s/p L3-5 prone trans-psoas approach for lateral interbody fusion; R L4-5 facetectomy and posterior segmental instrumentation L3-5 (12/2023). PMH significant for PD (complicated by tremor, gait impairment, REM sleep behavior and cognitive impairment ), HTN, DM, gout, glaucoma, PVD, orthostatic hypotension  are also affecting patient's functional outcome.   REHAB POTENTIAL: Good  CLINICAL DECISION MAKING: Evolving/moderate complexity  EVALUATION COMPLEXITY: Moderate  PLAN:  PT FREQUENCY: 2x/week  PT DURATION: 8 weeks - anticipate just 6 weeks   PLANNED INTERVENTIONS: [id number]- PT Re-evaluation, 97750- Physical Performance Testing, 97110-Therapeutic exercises, 97530- Therapeutic activity, 97112- Neuromuscular re-education, 97535- Self Care, [id number]-  Manual therapy, [phone]- Gait training, Patient/Family education, Balance training, Stair training, and DME instructions  PLAN FOR NEXT SESSION: CHECK STGS   review standing PWR moves with pt's spouse and add remainder to HEP, check BP!, work on wider BOS and turning tasks, perform miniBEST as able, work on balance and core exercises after back surgery    Sheffield LOISE Senate, PT, DPT 03/07/2024, 2:05 PM

## 2024-03-07 NOTE — Therapy (Signed)
 OUTPATIENT OCCUPATIONAL THERAPY PARKINSON'S TREATMENT  Patient Name: [name] MRN: [id number] DOB:Jul 18, 1951, 73 y.o., male Today's Date: 03/07/2024  PCP: [name], MD REFERRING PROVIDER: Caleen Dirks, MD  END OF SESSION:  OT End of Session - 03/07/24 1405     Visit Number 5    Number of Visits 16    Date for OT Re-Evaluation 04/10/24    Authorization Type [employer] 2025 - Auth required    OT Start Time 1405    OT Stop Time 1444    OT Time Calculation (min) 39 min    Activity Tolerance Patient tolerated treatment well    Behavior During Therapy WFL for tasks assessed/performed         Past Medical History:  Diagnosis Date   Allergy    seasonal   Anxiety    Arthritis    BPH (benign prostatic hyperplasia)    Complication of anesthesia    Pt. stated he had a reaction that ended in him requiring urinary cath placement; hx delirium worsening parkinson symptoms   Depression    Diverticulosis    GERD (gastroesophageal reflux disease)    esophageal spasms   Glaucoma    Gout    Head injury, closed, with concussion    Hepatitis C    chronic - Has been treated with Harvoni   HLD (hyperlipidemia)    statin intolerant (Crestor  & Simvastatin ) - Taking Livalo  1mg  / week   Hypertension    Neuromuscular disorder (HCC)    Parkinson's Disease   Parkinson's disease (HCC)    Plantar fasciitis    right   PVD (peripheral vascular disease) (HCC)    With no claudication; only mild abdominal aortic atherosclerosis noted on ultrasound.   Sleep apnea    Wears CPAP nightly   Spinal stenosis of lumbar region    Thoracic ascending aortic aneurysm (HCC)    4.2 cm ascending TAA 09/2016 CT, 1 yr f/u rec   Ulcer    Past Surgical History:  Procedure Laterality Date   ANTERIOR LAT LUMBAR FUSION N/A 12/31/2023   Procedure: Prone trans-psoas interbody fusion - Lumbar three-Lumbar four - Lumbar four-Lumbar five, right facetectomy Lumbar four-five;  Surgeon: [name], MD;   Location: [facility];  Service: Neurosurgery;  Laterality: N/A;   BUNIONECTOMY WITH WEIL OSTEOTOMY Right 11/02/2019   Procedure: Right Foot Lapidus, Modified McBride Bunionectomy,  Hallux Akin Osteotomy;  Surgeon: [name], MD;  Location: [facility];  Service: Orthopedics;  Laterality: Right;   CARDIAC CATHETERIZATION  2005   30% Cx. Dr. Lavon   cataract surgery Left 11/05/2014   COLONOSCOPY     COLONOSCOPY N/A 01/09/2021   Procedure: COLONOSCOPY;  Surgeon: [name], MD;  Location: [facility];  Service: Gastroenterology;  Laterality: N/A;   ESOPHAGOGASTRODUODENOSCOPY N/A 05/02/2015   Procedure: ESOPHAGOGASTRODUODENOSCOPY (EGD);  Surgeon: [name], MD;  Location: [facility];  Service: Endoscopy;  Laterality: N/A;   ESOPHAGOGASTRODUODENOSCOPY  05/02/2015   no source of pt chest pain endoscopically evident. small hiatal hernia.   ESOPHAGOGASTRODUODENOSCOPY (EGD) WITH PROPOFOL  N/A 01/02/2021   Procedure: ESOPHAGOGASTRODUODENOSCOPY (EGD) WITH PROPOFOL ;  Surgeon: [name], MD;  Location: [facility];  Service: Endoscopy;  Laterality: N/A;   HARDWARE REMOVAL Right 11/02/2019   Procedure: Second Metatarsal Removal of Deep Implant and Rotational Osteotomy;  Surgeon: [name], MD;  Location: [facility];  Service: Orthopedics;  Laterality: Right;   IR ANGIOGRAM SELECTIVE EACH ADDITIONAL VESSEL  01/04/2021   IR ANGIOGRAM SELECTIVE EACH  ADDITIONAL VESSEL  01/04/2021   IR ANGIOGRAM VISCERAL SELECTIVE  01/04/2021   IR US  GUIDE VASC ACCESS RIGHT  01/04/2021   LUMBAR LAMINECTOMY/DECOMPRESSION MICRODISCECTOMY N/A 07/03/2021   Procedure: Lumbar three through five decompression with lumbar three through five insitu fusion;  Surgeon: [name], MD;  Location: [facility] OR;  Service: Orthopedics;  Laterality: N/A;   LUMBAR PERCUTANEOUS PEDICLE SCREW 2 LEVEL N/A 12/31/2023   Procedure: LUMBAR PERCUTANEOUS PEDICLE SCREW LUMBAR THREE-LUMBAR FIVE;  Surgeon: [name], MD;  Location: [facility];  Service: Neurosurgery;  Laterality: N/A;   MEMBRANE PEEL Left 03/14/2014   Procedure: MEMBRANE PEEL; ENDOLASER;  Surgeon: [name], MD;  Location: [facility];  Service: Ophthalmology;  Laterality: Left;   NM MYOVIEW  LTD  10/2015   LOW RISK. Small, fixed basal lateral defect - likely diaphragmatic attenuation. EF 69%   PARS PLANA VITRECTOMY Left 03/14/2014   Procedure: PARS PLANA VITRECTOMY WITH 25 GAUGE;  Surgeon: [name], MD;  Location: [facility] OR;  Service: Ophthalmology;  Laterality: Left;   shave  02/03/2022   angiofibroma   TONSILLECTOMY     TRANSTHORACIC ECHOCARDIOGRAM  01/07/2021   Normal EF 60 to 65%.  No R WMA.  GRII DD-moderately dilated LA..  Mildly dilated RV but normal function.  Normal RAP/CVP.  Trivial AI with mild to moderate sclerosis-no stenosis   UMBILICAL HERNIA REPAIR  2023   UPPER GASTROINTESTINAL ENDOSCOPY     WEIL OSTEOTOMY Right 09/01/2017   Procedure: RIGHT GREAT TOE CHEVRON AND WEIL OSTEOTOMY 2ND METATARSAL;  Surgeon: [name], MD;  Location: [facility];  Service: Orthopedics;  Laterality: Right;   XI ROBOTIC ASSISTED VENTRAL HERNIA N/A 03/17/2022   Procedure: XI ROBOTIC ASSISTED VENTRAL HERNIA;  Surgeon: [name], MD;  Location: [facility];  Service: General;  Laterality: N/A;   Patient Active Problem List   Diagnosis Date Noted   Malnutrition of moderate degree 01/06/2024   Spondylolisthesis at L4-L5 level 12/31/2023   Supine hypertension 12/03/2023   Parkinson's disease with neurogenic orthostatic hypotension (HCC) 12/03/2023   Preop cardiovascular exam 12/03/2023   Glaucoma 11/30/2023   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   Ventral hernia without obstruction or gangrene    Primary open angle glaucoma of left eye, mild stage 06/23/2021   Facial numbness 12/24/2020   Neovascular glaucoma, right eye, stage unspecified 12/16/2020   Lumbar radiculopathy 11/25/2020   Gastroesophageal reflux disease 09/10/2020   Dry eyes,  bilateral 08/15/2020   Presbycusis of right ear 01/17/2020   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   Secondary corneal edema of right eye 12/12/2019   Ptosis of right eyelid 12/12/2019   OAB (overactive bladder) 11/29/2019   Bunion of great toe of right foot 07/14/2017   Claw toe, acquired, right 07/14/2017   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   Medication management 10/09/2016   Plantar fasciitis of right foot 02/19/2015   Depression 08/14/2014   Family history of heart disease in male family member before age 75 04/26/2014   Mild cognitive impairment 03/13/2014   Parkinsonian tremor (HCC) 02/12/2014   Hyperlipidemia with target LDL less than 100    Abdominal aortic atherosclerosis (HCC)    Moderate essential hypertension 02/25/2011   Arthropathy 02/25/2011   HAV (hallux abducto valgus) 02/25/2011   ONSET DATE: 01/21/2024 (referral date)   REFERRING DIAG: M43.16 (ICD-10-CM) - Spondylolisthesis, lumbar region  THERAPY DIAG:  Other lack of coordination  Other symptoms and signs involving the  nervous system  Muscle weakness (generalized)  Visuospatial deficit  Parkinson's disease without dyskinesia, with fluctuating manifestations (HCC)  Rationale for Evaluation and Treatment: Rehabilitation  SUBJECTIVE:   SUBJECTIVE STATEMENT: Pt reports he does not know when to complete PT vs OT exercises or how to distinguish them in his binder.   Pt accompanied by: self  PERTINENT HISTORY: recent back surgery (L3-5) on 12/31/23, Parkinson's disease causing autonomic dysregulation and orthostatic hypotension, leading to blood pressure fluctuations and dizziness upon standing. Lumbar radiculopathy (Chronic), PVD, HLD, spinal stenosis, BPH   Post-surgical recovery complicated by inappropriate medication management and inadequate rehabilitation, with delirium post-anesthesia likely exacerbated by Parkinson's  disease.    PRECAUTIONS: Other: can take off back brace when seated or in bed and some walking in house, but wear it mostly when up. Back precautions (no BLT), no lifting over 10 lbs, orthostatic hypotension   WEIGHT BEARING RESTRICTIONS: No  PAIN:  Are you having pain? Yes: NPRS scale: 1-3/10  Pain location: back pain Pain description: fluctuates Aggravating factors: staying in one position too long Relieving factors: changing positions, OTC Meds  FALLS: Has patient fallen in last 6 months? Yes. Number of falls 1  LIVING ENVIRONMENT: Lives with: lives with their spouse Lives in: 2 story home, but lives on first floor, 3 steps to enter Has following equipment at home: Vannie - 2 wheeled, shower chair, and Grab bars  PLOF: Needs assistance with ADLs prior to surgery, enjoys music and traveling  PATIENT GOALS: improve function and speed with ADLS including handwriting  OBJECTIVE:  Note: Objective measures were completed at Evaluation unless otherwise noted.  BP assessed and [facility] for therapy visit.   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  MOBILITY STATUS: Independent  POSTURE COMMENTS:  increased lumbar lordosis, increased thoracic kyphosis, and posterior pelvic tilt   FUNCTIONAL OUTCOME MEASURES: Fastening/unfastening 3 buttons: 53 sec Physical performance test: PPT#2 (simulated eating)  27.21 & PPT#4 (donning/doffing jacket): 14.12 sec (w/ hospital gown)   COORDINATION: 9 Hole Peg test: Right: 31.60 sec; Left: 41.77 sec Box and Blocks:  Right: 37 blocks; Left: 37 blocks  UE ROM:  WFL   SENSATION: Fingertips get cold   COGNITION: Overall cognitive status: Impaired and decreased memory, processing speed  VISION:  Pt/family reports can only see light and images from Rt eye (? Legally blind), glaucoma and decreased vision Lt eye  OBSERVATIONS: Bradykinesia, Hypokinesia, and decline in cognition and overall function since back surgery  TREATMENT:   OT educated pt on use of exercise chart as noted in pt instructions. List created in order of the handouts in his binder. Per subjective, OT spent additional time labeling the handouts for better identification secondary to low vision.   PATIENT EDUCATION: Education details: phone magnifiers; bag exercises Person educated: Patient Education method: Explanation, Demonstration, Verbal cues, and Handouts Education comprehension: verbalized understanding, returned demonstration, verbal cues required, and needs further education   HOME EXERCISE PROGRAM: 02/17/2024: handwriting changes 02/24/2024: new behind head bag exercise handout 03/07/2024: Exercise Chart   GOALS: Goals reviewed with patient? Yes  SHORT TERM GOALS: Target date: 03/10/24  Independent with PD specific HEP (Will need to adapt d/t current back precautions, consider adding bag ex's for ADLS) Baseline: Goal status: INITIAL  2.  Pt to write name with 90% or greater legibility with only min micrographia Baseline:  Goal status: IN PROGRESS  3. Pt will demonstrate improved ease with feeding as evidenced by decreasing PPT#2 by 8 secs or more Baseline: 27.21 sec Goal status: INITIAL  4.  Pt will demonstrate improved ease with fastening buttons  as evidenced by decreasing 3 button/unbutton time by 5 seconds or more Baseline: 53 sec Goal status: INITIAL  5.  Pt will be able to place at least 3 additional blocks bilaterally with completion of Box and Blocks test. Baseline: Right: 37 blocks; Left: 37 blocks Goal status: INITIAL   LONG TERM GOALS: Target date: 04/10/24  Pt will verbalize understanding of ways to prevent future PD related complications and appropriate community resources prn  Baseline:  Goal status: INITIAL  2.  Pt will verbalize understanding of ways to keep thinking skills sharp and ways to compensate for STM changes in the future  Baseline:  Goal status: INITIAL  3.  Pt will verbalize understanding of adaptive strategies to increase ease with ADLS/IADLS   Baseline:  Goal status: INITIAL  4.  Pt will write a sentence with no significant decrease in size and maintain 90% legibility  Baseline:  Goal status: INITIAL  5.  Pt will demonstrate improved fine motor coordination for ADLs as evidenced by decreasing 9 hole peg test score for Lt hand by 6 secs  Baseline: 41.77 sec.  Goal status: INITIAL  6.  Pt will demonstrate improved fine motor coordination for ADLs as evidenced by decreasing 9 hole peg test score for Rt hand by 3 secs  Baseline: 31.60 sec Goal status: INITIAL  ASSESSMENT:  CLINICAL IMPRESSION: Patient appears to have better understanding of HEP completion for better management of PD symptoms and overall carryover. He will likely require additional review secondary to cognition. Will add additional coordination and tremor reduction strategies at next visit.   PERFORMANCE DEFICITS: in functional skills including ADLs, IADLs, coordination, dexterity, proprioception, sensation, ROM, strength, pain, Fine motor control, Gross motor control, hearing, mobility, balance, body mechanics, endurance, decreased knowledge of use of DME, vision, and UE functional use, cognitive skills including memory and  safety awareness, and psychosocial skills including coping strategies.   IMPAIRMENTS: are limiting patient from ADLs, IADLs, rest and sleep, leisure, and social participation.   COMORBIDITIES:  has co-morbidities such as recent back surgery with back precautions, orthostatic hypotension, fluctuating BP that affects occupational performance. Patient will benefit from skilled OT to address above impairments and improve overall function.  REHAB POTENTIAL: Good  PLAN:  OT FREQUENCY: 2x/week  OT DURATION: 8 weeks  PLANNED INTERVENTIONS: 97535 self care/ADL training, 02889 therapeutic exercise, 97530 therapeutic activity, 97112 neuromuscular re-education, 97140  manual therapy, [id number] aquatic therapy, 97035 ultrasound, 02989 moist heat, 97129 Cognitive training (first 15 min), 97130 Cognitive training(each additional 15 min), 97750 Physical Performance Testing, passive range of motion, functional mobility training, visual/perceptual remediation/compensation, energy conservation, coping strategies training, patient/family education, and DME and/or AE instructions  RECOMMENDED OTHER SERVICES: will request speech therapy referral but will wait to make speech evaluation until after pt gets new hearing aids  CONSULTED AND AGREED WITH PLAN OF CARE: Patient and family member/caregiver  PLAN FOR NEXT SESSION: monitor BP (fluctuates),  ADLS and coordination (HEP beyond cards and dominoes). Bag exs for dressing (new handout with pictures?); vision options for phone - Tinya Cadogan - did he practice with new mag? (Does not prefer [employer]); TREMOR Reduction strategies handout   Jocelyn CHRISTELLA Bottom, OT 03/07/2024, 5:31 PM

## 2024-03-09 ENCOUNTER — Ambulatory Visit (INDEPENDENT_AMBULATORY_CARE_PROVIDER_SITE_OTHER): Admitting: Family Medicine

## 2024-03-09 ENCOUNTER — Other Ambulatory Visit (HOSPITAL_COMMUNITY): Payer: Self-pay

## 2024-03-09 ENCOUNTER — Encounter: Payer: Self-pay | Admitting: Family Medicine

## 2024-03-09 VITALS — BP 118/62 | HR 56 | Ht 71.0 in | Wt 150.0 lb

## 2024-03-09 DIAGNOSIS — I10 Essential (primary) hypertension: Secondary | ICD-10-CM | POA: Diagnosis not present

## 2024-03-09 DIAGNOSIS — Z9889 Other specified postprocedural states: Secondary | ICD-10-CM

## 2024-03-09 DIAGNOSIS — L723 Sebaceous cyst: Secondary | ICD-10-CM

## 2024-03-09 DIAGNOSIS — B182 Chronic viral hepatitis C: Secondary | ICD-10-CM | POA: Insufficient documentation

## 2024-03-09 NOTE — Progress Notes (Signed)
   Subjective:    Patient ID: [name], male    DOB: 08-16-1950, 73 y.o.   MRN: [id number]  HPI He is here for evaluation of multiple issues.  He has a lesion on his anterior chest and left shoulder that he wants to be reevaluated.  Also he recently had surgery and because his blood pressure was low his blood pressure medications have been put on hold.  He does complain of cold hands and cold feet.  He has also had some previous foot surgery and does have overlapping of 1 toe over another.  He was told to wear orthotics and use a spacer but he does not like the spacer.  He was also told to use compression stocks to help with the cold feet.   Review of Systems     Objective:    Physical Exam Sebaceous cyst noted in the anterior chest area and also on the left shoulder.  Hands and feet show good pulses with no change in skin.  Exam of the left foot does show evidence of surgery.  The toes can mechanically placed in normal position but when he puts his foot down there is some overlapping that occurs between his 2nd and 3rd toes.       Assessment & Plan:  Sebaceous cyst  H/O foot surgery  Moderate essential hypertension I explained that the cyst does not need to be treated.  Reinforced the fact that the cold is not nothing we can really do much about other than wearing gloves or putting hose on.  Recommend that he follow the instructions of the foot specialist and use the orthotic as well as spacers and if he continues have trouble with that he is to follow-up with the surgeon to see if this thing else can be done.  He will continue to monitor his blood pressure.

## 2024-03-09 NOTE — Therapy (Deleted)
 OUTPATIENT OCCUPATIONAL THERAPY PARKINSON'S TREATMENT  Patient Name: [name] MRN: [id number] DOB:1950/11/15, 73 y.o., male Today's Date: 03/09/2024  PCP: [name], MD REFERRING PROVIDER: Caleen Dirks, MD  END OF SESSION:   Past Medical History:  Diagnosis Date   Allergy    seasonal   Anxiety    Arthritis    BPH (benign prostatic hyperplasia)    Complication of anesthesia    Pt. stated he had a reaction that ended in him requiring urinary cath placement; hx delirium worsening parkinson symptoms   Depression    Diverticulosis    GERD (gastroesophageal reflux disease)    esophageal spasms   Glaucoma    Gout    Head injury, closed, with concussion    Hepatitis C    chronic - Has been treated with Harvoni   HLD (hyperlipidemia)    statin intolerant (Crestor  & Simvastatin ) - Taking Livalo  1mg  / week   Hypertension    Neuromuscular disorder (HCC)    Parkinson's Disease   Parkinson's disease (HCC)    Plantar fasciitis    right   PVD (peripheral vascular disease) (HCC)    With no claudication; only mild abdominal aortic atherosclerosis noted on ultrasound.   Sleep apnea    Wears CPAP nightly   Spinal stenosis of lumbar region    Thoracic ascending aortic aneurysm (HCC)    4.2 cm ascending TAA 09/2016 CT, 1 yr f/u rec   Ulcer    Past Surgical History:  Procedure Laterality Date   ANTERIOR LAT LUMBAR FUSION N/A 12/31/2023   Procedure: Prone trans-psoas interbody fusion - Lumbar three-Lumbar four - Lumbar four-Lumbar five, right facetectomy Lumbar four-five;  Surgeon: [name], MD;  Location: [facility];  Service: Neurosurgery;  Laterality: N/A;   BUNIONECTOMY WITH WEIL OSTEOTOMY Right 11/02/2019   Procedure: Right Foot Lapidus, Modified McBride Bunionectomy,  Hallux Akin Osteotomy;  Surgeon: [name], MD;  Location: [facility];  Service: Orthopedics;  Laterality: Right;   CARDIAC CATHETERIZATION  2005   30% Cx. Dr. Lavon   cataract  surgery Left 11/05/2014   COLONOSCOPY     COLONOSCOPY N/A 01/09/2021   Procedure: COLONOSCOPY;  Surgeon: [name], MD;  Location: [facility];  Service: Gastroenterology;  Laterality: N/A;   ESOPHAGOGASTRODUODENOSCOPY N/A 05/02/2015   Procedure: ESOPHAGOGASTRODUODENOSCOPY (EGD);  Surgeon: [name], MD;  Location: [facility];  Service: Endoscopy;  Laterality: N/A;   ESOPHAGOGASTRODUODENOSCOPY  05/02/2015   no source of pt chest pain endoscopically evident. small hiatal hernia.   ESOPHAGOGASTRODUODENOSCOPY (EGD) WITH PROPOFOL  N/A 01/02/2021   Procedure: ESOPHAGOGASTRODUODENOSCOPY (EGD) WITH PROPOFOL ;  Surgeon: [name], MD;  Location: [facility];  Service: Endoscopy;  Laterality: N/A;   HARDWARE REMOVAL Right 11/02/2019   Procedure: Second Metatarsal Removal of Deep Implant and Rotational Osteotomy;  Surgeon: [name], MD;  Location: [facility];  Service: Orthopedics;  Laterality: Right;   IR ANGIOGRAM SELECTIVE EACH ADDITIONAL VESSEL  01/04/2021   IR ANGIOGRAM SELECTIVE EACH ADDITIONAL VESSEL  01/04/2021   IR ANGIOGRAM VISCERAL SELECTIVE  01/04/2021   IR US  GUIDE VASC ACCESS RIGHT  01/04/2021   LUMBAR LAMINECTOMY/DECOMPRESSION MICRODISCECTOMY N/A 07/03/2021   Procedure: Lumbar three through five decompression with lumbar three through five insitu fusion;  Surgeon: [name], MD;  Location: [facility];  Service: Orthopedics;  Laterality: N/A;   LUMBAR PERCUTANEOUS PEDICLE SCREW 2 LEVEL N/A 12/31/2023   Procedure: LUMBAR PERCUTANEOUS PEDICLE SCREW LUMBAR THREE-LUMBAR FIVE;  Surgeon: [name], MD;  Location: [facility];  Service: Neurosurgery;  Laterality: N/A;   MEMBRANE PEEL Left 03/14/2014   Procedure: MEMBRANE PEEL; ENDOLASER;  Surgeon: [name], MD;  Location: [facility];  Service: Ophthalmology;  Laterality: Left;   NM MYOVIEW  LTD  10/2015   LOW RISK. Small, fixed basal lateral defect - likely diaphragmatic attenuation. EF 69%   PARS PLANA VITRECTOMY  Left 03/14/2014   Procedure: PARS PLANA VITRECTOMY WITH 25 GAUGE;  Surgeon: [name], MD;  Location: [facility] OR;  Service: Ophthalmology;  Laterality: Left;   shave  02/03/2022   angiofibroma   TONSILLECTOMY     TRANSTHORACIC ECHOCARDIOGRAM  01/07/2021   Normal EF 60 to 65%.  No R WMA.  GRII DD-moderately dilated LA..  Mildly dilated RV but normal function.  Normal RAP/CVP.  Trivial AI with mild to moderate sclerosis-no stenosis   UMBILICAL HERNIA REPAIR  2023   UPPER GASTROINTESTINAL ENDOSCOPY     WEIL OSTEOTOMY Right 09/01/2017   Procedure: RIGHT GREAT TOE CHEVRON AND WEIL OSTEOTOMY 2ND METATARSAL;  Surgeon: [name], MD;  Location: [facility];  Service: Orthopedics;  Laterality: Right;   XI ROBOTIC ASSISTED VENTRAL HERNIA N/A 03/17/2022   Procedure: XI ROBOTIC ASSISTED VENTRAL HERNIA;  Surgeon: [name], MD;  Location: [facility];  Service: General;  Laterality: N/A;   Patient Active Problem List   Diagnosis Date Noted   Chronic hepatitis C (HCC) 03/09/2024   Malnutrition of moderate degree 01/06/2024   Spondylolisthesis at L4-L5 level 12/31/2023   Supine hypertension 12/03/2023   Parkinson's disease with neurogenic orthostatic hypotension (HCC) 12/03/2023   Preop cardiovascular exam 12/03/2023   Glaucoma 11/30/2023   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   Acquired hallux rigidus of right foot 11/23/2022   Ventral hernia without obstruction or gangrene    Primary open angle glaucoma of left eye, mild stage 06/23/2021   Facial numbness 12/24/2020   Neovascular glaucoma, right eye, stage unspecified 12/16/2020   Lumbar radiculopathy 11/25/2020   Gastroesophageal reflux disease 09/10/2020   Dry eyes, bilateral 08/15/2020   Presbycusis of right ear 01/17/2020   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   Secondary corneal edema of right eye 12/12/2019   Ptosis of right eyelid 12/12/2019   OAB (overactive bladder) 11/29/2019   Bunion of great  toe of right foot 07/14/2017   Claw toe, acquired, right 07/14/2017   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   Medication management 10/09/2016   Plantar fasciitis of right foot 02/19/2015   Depression 08/14/2014   Family history of heart disease in male family member before age 53 04/26/2014   Mild cognitive impairment 03/13/2014   Parkinsonian tremor (HCC) 02/12/2014   Hyperlipidemia with target LDL less than 100    Abdominal aortic atherosclerosis (HCC)    Moderate essential hypertension 02/25/2011   Arthropathy 02/25/2011   HAV (hallux abducto valgus) 02/25/2011   ONSET DATE: 01/21/2024 (referral date)   REFERRING DIAG: M43.16 (ICD-10-CM) - Spondylolisthesis, lumbar region  THERAPY DIAG:  No diagnosis found.  Rationale for Evaluation and Treatment: Rehabilitation  SUBJECTIVE:   SUBJECTIVE STATEMENT: Pt reports he does not know when to complete PT vs OT exercises or how to distinguish them in his binder.   Pt accompanied by: self  PERTINENT HISTORY: recent back surgery (L3-5) on 12/31/23, Parkinson's disease causing autonomic dysregulation and orthostatic hypotension, leading to blood pressure fluctuations and dizziness upon standing. Lumbar radiculopathy (Chronic), PVD, HLD, spinal stenosis, BPH   Post-surgical recovery complicated by inappropriate  medication management and inadequate rehabilitation, with delirium post-anesthesia likely exacerbated by Parkinson's disease.    PRECAUTIONS: Other: can take off back brace when seated or in bed and some walking in house, but wear it mostly when up. Back precautions (no BLT), no lifting over 10 lbs, orthostatic hypotension   WEIGHT BEARING RESTRICTIONS: No  PAIN:  Are you having pain? Yes: NPRS scale: 1-3/10  Pain location: back pain Pain description: fluctuates Aggravating factors: staying in one position too long Relieving factors: changing positions, OTC Meds  FALLS: Has patient fallen  in last 6 months? Yes. Number of falls 1  LIVING ENVIRONMENT: Lives with: lives with their spouse Lives in: 2 story home, but lives on first floor, 3 steps to enter Has following equipment at home: Vannie - 2 wheeled, shower chair, and Grab bars  PLOF: Needs assistance with ADLs prior to surgery, enjoys music and traveling  PATIENT GOALS: improve function and speed with ADLS including handwriting  OBJECTIVE:  Note: Objective measures were completed at Evaluation unless otherwise noted.  BP assessed and [facility] for therapy visit.   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  MOBILITY STATUS: Independent  POSTURE COMMENTS:  increased lumbar lordosis, increased thoracic kyphosis, and posterior pelvic tilt   FUNCTIONAL OUTCOME MEASURES: Fastening/unfastening 3 buttons: 53 sec Physical performance test: PPT#2 (simulated eating) 27.21 & PPT#4 (donning/doffing jacket): 14.12 sec (w/ hospital gown)   COORDINATION: 9 Hole Peg test: Right: 31.60 sec; Left: 41.77 sec Box and Blocks:  Right: 37 blocks; Left: 37 blocks  UE ROM:  WFL   SENSATION: Fingertips get cold   COGNITION: Overall cognitive status: Impaired and decreased memory, processing speed  VISION:  Pt/family reports can only see light and images from Rt eye (? Legally blind), glaucoma and decreased vision Lt eye  OBSERVATIONS: Bradykinesia,  Hypokinesia, and decline in cognition and overall function since back surgery                                                                                                                   TREATMENT:   OT educated pt on use of exercise chart as noted in pt instructions. List created in order of the handouts in his binder. Per subjective, OT spent additional time labeling the handouts for better identification secondary to low vision.   PATIENT EDUCATION: Education details: phone magnifiers; bag exercises Person educated: Patient Education method: Explanation, Demonstration, Verbal cues, and Handouts Education comprehension: verbalized understanding, returned demonstration, verbal cues required, and needs further education   HOME EXERCISE PROGRAM: 02/17/2024: handwriting changes 02/24/2024:  new behind head bag exercise handout 03/07/2024: Exercise Chart   GOALS: Goals reviewed with patient? Yes  SHORT TERM GOALS: Target date: 03/10/24  Independent with PD specific HEP (Will need to adapt d/t current back precautions, consider adding bag ex's for ADLS) Baseline: Goal status: INITIAL  2.  Pt to write name with 90% or greater legibility with only min micrographia Baseline:  Goal status: IN PROGRESS  3. Pt will demonstrate improved ease with feeding as evidenced by decreasing PPT#2 by 8 secs or more Baseline: 27.21 sec Goal status: INITIAL  4.  Pt will demonstrate improved ease with fastening buttons as evidenced by decreasing 3 button/unbutton time by 5 seconds or more Baseline: 53 sec Goal status: INITIAL  5.  Pt will be able to place at least 3 additional blocks bilaterally with completion of Box and Blocks test. Baseline: Right: 37 blocks; Left: 37 blocks Goal status: INITIAL   LONG TERM GOALS: Target date: 04/10/24  Pt will verbalize understanding of ways to prevent future PD related complications and appropriate community resources prn  Baseline:  Goal status:  INITIAL  2.  Pt will verbalize understanding of ways to keep thinking skills sharp and ways to compensate for STM changes in the future  Baseline:  Goal status: INITIAL  3.  Pt will verbalize understanding of adaptive strategies to increase ease with ADLS/IADLS   Baseline:  Goal status: INITIAL  4.  Pt will write a sentence with no significant decrease in size and maintain 90% legibility  Baseline:  Goal status: INITIAL  5.  Pt will demonstrate improved fine motor coordination for ADLs as evidenced by decreasing 9 hole peg test score for Lt hand by 6 secs  Baseline: 41.77 sec.  Goal status: INITIAL  6.  Pt will demonstrate improved fine motor coordination for ADLs as evidenced by decreasing 9 hole peg test score for Rt hand by 3 secs  Baseline: 31.60 sec Goal status: INITIAL  ASSESSMENT:  CLINICAL IMPRESSION: Patient appears to have better understanding of HEP completion for better management of PD symptoms and overall carryover. He will likely require additional review secondary to cognition. Will add additional coordination and tremor reduction strategies at next visit.   PERFORMANCE DEFICITS: in functional skills including ADLs, IADLs, coordination, dexterity, proprioception, sensation, ROM, strength, pain, Fine motor control, Gross motor control, hearing, mobility, balance, body mechanics, endurance, decreased knowledge of use of DME, vision, and UE functional use, cognitive skills including memory and safety awareness, and psychosocial skills including coping strategies.   IMPAIRMENTS: are limiting patient from ADLs, IADLs, rest and sleep, leisure, and social participation.   COMORBIDITIES:  has co-morbidities such as recent back surgery with back precautions, orthostatic hypotension, fluctuating BP that affects occupational performance. Patient will benefit from skilled OT to address above impairments and improve overall function.  REHAB POTENTIAL: Good  PLAN:  OT  FREQUENCY: 2x/week  OT DURATION: 8 weeks  PLANNED INTERVENTIONS: 97535 self care/ADL training, 02889 therapeutic exercise, 97530 therapeutic activity, 97112 neuromuscular re-education, 97140 manual therapy, 97113 aquatic therapy, 97035 ultrasound, 97010 moist heat, 97129 Cognitive training (first 15 min), 02869 Cognitive training(each additional 15 min), 02249 Physical Performance Testing, passive range of motion, functional mobility training, visual/perceptual remediation/compensation, energy conservation, coping strategies training, patient/family education, and DME and/or AE instructions  RECOMMENDED OTHER SERVICES: will request speech therapy referral but will wait to make speech evaluation until after pt gets new hearing aids  CONSULTED AND AGREED WITH PLAN OF CARE: Patient and family member/caregiver  PLAN FOR  NEXT SESSION: monitor BP (fluctuates),  ADLS and coordination (HEP beyond cards and dominoes). Bag exs for dressing (new handout with pictures?); vision options for phone - Saydi Kobel - did he practice with new mag? (Does not prefer [employer]); TREMOR Reduction strategies handout   Jocelyn CHRISTELLA Bottom, OT 03/09/2024, 5:26 PM

## 2024-03-10 ENCOUNTER — Encounter: Payer: Self-pay | Admitting: Physical Therapy

## 2024-03-10 ENCOUNTER — Ambulatory Visit: Admitting: Physical Therapy

## 2024-03-10 ENCOUNTER — Ambulatory Visit: Admitting: Occupational Therapy

## 2024-03-10 VITALS — BP 106/76 | HR 59

## 2024-03-10 DIAGNOSIS — M6281 Muscle weakness (generalized): Secondary | ICD-10-CM

## 2024-03-10 DIAGNOSIS — R29818 Other symptoms and signs involving the nervous system: Secondary | ICD-10-CM

## 2024-03-10 DIAGNOSIS — R2681 Unsteadiness on feet: Secondary | ICD-10-CM

## 2024-03-10 DIAGNOSIS — G20A2 Parkinson's disease without dyskinesia, with fluctuations: Secondary | ICD-10-CM | POA: Diagnosis not present

## 2024-03-10 DIAGNOSIS — R2689 Other abnormalities of gait and mobility: Secondary | ICD-10-CM | POA: Diagnosis not present

## 2024-03-10 DIAGNOSIS — R278 Other lack of coordination: Secondary | ICD-10-CM | POA: Diagnosis not present

## 2024-03-10 NOTE — Therapy (Signed)
 OUTPATIENT PHYSICAL THERAPY NEURO TREATMENT   Patient Name: [name] MRN: [id number] DOB:[birth year], 73 y.o., male Today's Date: 03/10/2024   PCP: [name], MD   REFERRING PROVIDER: Caleen Dirks, MD (sent to Harlene Bogaert, NP)  END OF SESSION:  PT End of Session - 03/10/24 1025     Visit Number 3    Number of Visits 13    Date for PT Re-Evaluation 04/08/24   due to potential delay in scheduling   Authorization Type [employer] Medicare    PT Start Time 1024   pt late to session   PT Stop Time 1100    PT Time Calculation (min) 36 min    Equipment Utilized During Treatment Gait belt    Activity Tolerance Patient tolerated treatment well    Behavior During Therapy WFL for tasks assessed/performed;Flat affect          Past Medical History:  Diagnosis Date   Allergy    seasonal   Anxiety    Arthritis    BPH (benign prostatic hyperplasia)    Complication of anesthesia    Pt. stated he had a reaction that ended in him requiring urinary cath placement; hx delirium worsening parkinson symptoms   Depression    Diverticulosis    GERD (gastroesophageal reflux disease)    esophageal spasms   Glaucoma    Gout    Head injury, closed, with concussion    Hepatitis C    chronic - Has been treated with Harvoni   HLD (hyperlipidemia)    statin intolerant (Crestor  & Simvastatin ) - Taking Livalo  1mg  / week   Hypertension    Neuromuscular disorder (HCC)    Parkinson's Disease   Parkinson's disease (HCC)    Plantar fasciitis    right   PVD (peripheral vascular disease) (HCC)    With no claudication; only mild abdominal aortic atherosclerosis noted on ultrasound.   Sleep apnea    Wears CPAP nightly   Spinal stenosis of lumbar region    Thoracic ascending aortic aneurysm (HCC)    4.2 cm ascending TAA 09/2016 CT, 1 yr f/u rec   Ulcer    Past Surgical History:  Procedure Laterality Date   ANTERIOR LAT LUMBAR FUSION N/A 12/31/2023   Procedure: Prone trans-psoas interbody  fusion - Lumbar three-Lumbar four - Lumbar four-Lumbar five, right facetectomy Lumbar four-five;  Surgeon: [name], MD;  Location: [facility];  Service: Neurosurgery;  Laterality: N/A;   BUNIONECTOMY WITH WEIL OSTEOTOMY Right 11/02/2019   Procedure: Right Foot Lapidus, Modified McBride Bunionectomy,  Hallux Akin Osteotomy;  Surgeon: [name], MD;  Location: [facility];  Service: Orthopedics;  Laterality: Right;   CARDIAC CATHETERIZATION  2005   30% Cx. Dr. Lavon   cataract surgery Left 11/05/2014   COLONOSCOPY     COLONOSCOPY N/A 01/09/2021   Procedure: COLONOSCOPY;  Surgeon: [name], MD;  Location: [facility];  Service: Gastroenterology;  Laterality: N/A;   ESOPHAGOGASTRODUODENOSCOPY N/A 05/02/2015   Procedure: ESOPHAGOGASTRODUODENOSCOPY (EGD);  Surgeon: [name], MD;  Location: [facility];  Service: Endoscopy;  Laterality: N/A;   ESOPHAGOGASTRODUODENOSCOPY  05/02/2015   no source of pt chest pain endoscopically evident. small hiatal hernia.   ESOPHAGOGASTRODUODENOSCOPY (EGD) WITH PROPOFOL  N/A 01/02/2021   Procedure: ESOPHAGOGASTRODUODENOSCOPY (EGD) WITH PROPOFOL ;  Surgeon: [name], MD;  Location: [facility];  Service: Endoscopy;  Laterality: N/A;   HARDWARE REMOVAL Right 11/02/2019   Procedure: Second Metatarsal Removal of Deep Implant and Rotational Osteotomy;  Surgeon: [name], MD;  Location: [facility];  Service: Orthopedics;  Laterality: Right;   IR ANGIOGRAM SELECTIVE EACH ADDITIONAL VESSEL  01/04/2021   IR ANGIOGRAM SELECTIVE EACH ADDITIONAL VESSEL  01/04/2021   IR ANGIOGRAM VISCERAL SELECTIVE  01/04/2021   IR US  GUIDE VASC ACCESS RIGHT  01/04/2021   LUMBAR LAMINECTOMY/DECOMPRESSION MICRODISCECTOMY N/A 07/03/2021   Procedure: Lumbar three through five decompression with lumbar three through five insitu fusion;  Surgeon: [name], MD;  Location: [facility];  Service: Orthopedics;  Laterality: N/A;   LUMBAR PERCUTANEOUS  PEDICLE SCREW 2 LEVEL N/A 12/31/2023   Procedure: LUMBAR PERCUTANEOUS PEDICLE SCREW LUMBAR THREE-LUMBAR FIVE;  Surgeon: [name], MD;  Location: [facility];  Service: Neurosurgery;  Laterality: N/A;   MEMBRANE PEEL Left 03/14/2014   Procedure: MEMBRANE PEEL; ENDOLASER;  Surgeon: [name], MD;  Location: [facility];  Service: Ophthalmology;  Laterality: Left;   NM MYOVIEW  LTD  10/2015   LOW RISK. Small, fixed basal lateral defect - likely diaphragmatic attenuation. EF 69%   PARS PLANA VITRECTOMY Left 03/14/2014   Procedure: PARS PLANA VITRECTOMY WITH 25 GAUGE;  Surgeon: [name], MD;  Location: [facility] OR;  Service: Ophthalmology;  Laterality: Left;   shave  02/03/2022   angiofibroma   TONSILLECTOMY     TRANSTHORACIC ECHOCARDIOGRAM  01/07/2021   Normal EF 60 to 65%.  No R WMA.  GRII DD-moderately dilated LA..  Mildly dilated RV but normal function.  Normal RAP/CVP.  Trivial AI with mild to moderate sclerosis-no stenosis   UMBILICAL HERNIA REPAIR  2023   UPPER GASTROINTESTINAL ENDOSCOPY     WEIL OSTEOTOMY Right 09/01/2017   Procedure: RIGHT GREAT TOE CHEVRON AND WEIL OSTEOTOMY 2ND METATARSAL;  Surgeon: [name], MD;  Location: [facility];  Service: Orthopedics;  Laterality: Right;   XI ROBOTIC ASSISTED VENTRAL HERNIA N/A 03/17/2022   Procedure: XI ROBOTIC ASSISTED VENTRAL HERNIA;  Surgeon: [name], MD;  Location: [facility];  Service: General;  Laterality: N/A;   Patient Active Problem List   Diagnosis Date Noted   Chronic hepatitis C (HCC) 03/09/2024   Malnutrition of moderate degree 01/06/2024   Spondylolisthesis at L4-L5 level 12/31/2023   Supine hypertension 12/03/2023   Parkinson's disease with neurogenic orthostatic hypotension (HCC) 12/03/2023   Preop cardiovascular exam 12/03/2023   Glaucoma 11/30/2023   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   Acquired hallux rigidus of right foot 11/23/2022   Ventral hernia without obstruction or gangrene    Primary open angle glaucoma of  left eye, mild stage 06/23/2021   Facial numbness 12/24/2020   Neovascular glaucoma, right eye, stage unspecified 12/16/2020   Lumbar radiculopathy 11/25/2020   Gastroesophageal reflux disease 09/10/2020   Dry eyes, bilateral 08/15/2020   Presbycusis of right ear 01/17/2020   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   Secondary corneal edema of right eye 12/12/2019   Ptosis of right eyelid 12/12/2019   OAB (overactive bladder) 11/29/2019   Bunion of great toe of right foot 07/14/2017   Claw toe, acquired, right 07/14/2017   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   Medication management 10/09/2016   Plantar fasciitis of right foot 02/19/2015   Depression 08/14/2014   Family history of heart disease in male family member before age 79 04/26/2014   Mild cognitive impairment 03/13/2014   Parkinsonian tremor (HCC) 02/12/2014   Hyperlipidemia with target LDL less than 100    Abdominal aortic atherosclerosis (HCC)    Moderate essential hypertension 02/25/2011  Arthropathy 02/25/2011   HAV (hallux abducto valgus) 02/25/2011    ONSET DATE: 01/19/2024  REFERRING DIAG: M43.16 (ICD-10-CM) - Spondylolisthesis, lumbar region  THERAPY DIAG:  Other symptoms and signs involving the nervous system  Muscle weakness (generalized)  Unsteadiness on feet  Other abnormalities of gait and mobility  Rationale for Evaluation and Treatment: Rehabilitation  SUBJECTIVE:                                                                                                                                                                                             SUBJECTIVE STATEMENT: Pt reports nothing new since the other day. Waiting for new glasses to come. Was taking focus factor for memory and it was making his tremors worse. Let the neurologist know and they said it can be interfering with the Sinemet . Stopped taking it and his  tremors are getting better   Pt accompanied by: spouse  PERTINENT HISTORY: PMH: s/p L3-5 prone trans-psoas approach for lateral interbody fusion; R L4-5 facetectomy and posterior segmental instrumentation L3-5 (12/2023). PMH significant for PD (complicated by tremor, gait impairment, REM sleep behavior and cognitive impairment ), HTN, DM, gout, glaucoma, PVD   Per note from [facility]: He underwent back surgery on 6/6 (L2-5 lateral interbody fusion with posterior instrumentation) with postop confusion and delirium requiring restraints,  improved with use of Seroquel  and discontinuing opioids and muscle relaxants, suspected confusion due to GA, PD and possibly medication side effects.  He was discharged on Seroquel  25/50.  He was discharged to [facility] rehab on 6/18, has not yet started therapies. He continues to have occasional confusion although some what improving per wife, at times with paranoia and visual hallucinations, also has been wandering and currently wearing locator band on ankle   PAIN:  Are you having pain? No and a little soreness from doing the exercises   Vitals:   03/10/24 1031 03/10/24 1032  BP: 116/81 106/76  Pulse: (!) 59 (!) 59      PRECAUTIONS: Back, Fall, and Other: hx of orthostatics, pt's spouse has to remind him of his precautions    FALLS: Has patient fallen in last 6 months? Yes. Number of falls 1, had a fall getting up and fell back against the window, this happened about a week or 2 before the surgery   LIVING ENVIRONMENT: Lives with: lives with their spouse Lives in: House/apartment Stairs: Yes: Internal: 15 steps; on right going up and External: 2 on the front porch and 3 on the side steps; can reach both, doesn't have to go up indoor steps, can live on the bottom floor  Has following  equipment at home: Vannie - 2 wheeled, shower chair, and Grab bars  PLOF: Independent and Leisure: music, traveling Pt's spouse has to remind him of brace precautions  for ADLs   PATIENT GOALS: Wants to be able to be more independent, stronger, and more mobile   OBJECTIVE:  Note: Objective measures were completed at Evaluation unless otherwise noted.  COGNITION: Overall cognitive status: Impaired   SENSATION: Light touch: WFL Reports numbness/tingling intermittently down front of BLE   COORDINATION: Heel to shin: slightly harder with LLE   POSTURE: rounded shoulders and forward head   LOWER EXTREMITY MMT:    MMT Right Eval Left Eval  Hip flexion 5 4+  Hip extension    Hip abduction 4 4  Hip adduction 4+ 4+  Hip internal rotation    Hip external rotation    Knee flexion 4+ 4+  Knee extension 4 -4  Ankle dorsiflexion 5 5  Ankle plantarflexion    Ankle inversion    Ankle eversion    (Blank rows = not tested)  All tested in sitting   BED MOBILITY:  Pt reports will feel stuck trying to get his legs in and out of the bed. Reviewed the log roll technique for bed mobility (PT demonstrating on the bed)  TRANSFERS: Sit to stand: SBA  Assistive device utilized: None     Stand to sit: SBA  Assistive device utilized: None      Mainly performs with UE support, but can perform without, with intermittent retropulsion  GAIT: Findings: Gait Characteristics: step through pattern, decreased arm swing- Right, decreased arm swing- Left, decreased stride length, decreased ankle dorsiflexion- Right, decreased ankle dorsiflexion- Left, Right foot flat, Left foot flat, shuffling, decreased trunk rotation, and narrow BOS, Distance walked: Clinic distances , Assistive device utilized:None, Level of assistance: SBA and CGA, and Comments: intermittent CGA during turns due to pt with narrow BOS   FUNCTIONAL TESTS:  5 times sit to stand: 21.4 seconds with no UE support, last rep pt with an episode of retropulsion  10 meter walk test: 11.5 seconds with no AD = 2.85 ft/sec     [facility] PT Assessment - 02/08/24 1440       Timed Up and Go Test   Normal TUG  (seconds) 11.8    Manual TUG (seconds) 12.8    Cognitive TUG (seconds) 22   attempting retro counting by 3s, pt tends to add instead of subtract   TUG Comments pt with narrow BOS when turning with intermittent CGA                                                                                                                                      TREATMENT DATE: 03/11/24  Therapeutic Activity:  GAIT: Findings: Gait Characteristics: step through pattern, decreased arm swing- Right, decreased arm swing- Left, decreased stride length, decreased ankle dorsiflexion- Right, decreased ankle dorsiflexion- Left, Right foot flat,  Left foot flat, shuffling, decreased trunk rotation, and narrow BOS, Distance walked: Clinic distances , Assistive device utilized:None, Level of assistance: SBA and CGA, and Comments: pt with narrow BOS, with some scissoring noted with LLE   Vitals:   03/10/24 1031 03/10/24 1032  BP: 116/81 106/76  Pulse: (!) 59 (!) 59    Assessed BP in sitting and standing Pt with no lightheadedness today and pt wearing his compression socks    [facility] PT Assessment - 03/10/24 1034       Mini-BESTest   Sit To Stand Normal: Comes to stand without use of hands and stabilizes independently.    Rise to Toes Moderate: Heels up, but not full range (smaller than when holding hands), OR noticeable instability for 3 s.   unable to perform full range   Stand on one leg (left) Moderate: < 20 s   5 seconds   Stand on one leg (right) Moderate: < 20 s   ~1 seconds   Stand on one leg - lowest score 1    Compensatory Stepping Correction - Forward Normal: Recovers independently with a single, large step (second realignement is allowed).    Compensatory Stepping Correction - Backward Moderate: More than one step is required to recover equilibrium   2 steps   Compensatory Stepping Correction - Left Lateral Moderate: Several steps to recover equilibrium   2 steps   Compensatory Stepping Correction -  Right Lateral Moderate: Several steps to recover equilibrium   2 steps   Stepping Corredtion Lateral - lowest score 1    Stance - Feet together, eyes open, firm surface  Normal: 30s    Stance - Feet together, eyes closed, foam surface  Normal: 30s   mild sway   Incline - Eyes Closed Normal: Stands independently 30s and aligns with gravity    Change in Gait Speed Moderate: Unable to change walking speed or signs of imbalance    Walk with head turns - Horizontal Moderate: performs head turns with reduction in gait speed.    Walk with pivot turns Moderate:Turns with feet close SLOW (>4 steps) with good balance.    Step over obstacles Normal: Able to step over box with minimal change of gait speed and with good balance.    Timed UP & GO with Dual Task Moderate: Dual Task affects either counting OR walking (>10%) when compared to the TUG without Dual Task.    Mini-BEST total score 20         5x sit <> stand: 15.4 seconds with no UE support    NMR:  Pt performs PWR! Moves in standing position in front of mat table with chair in front of pt as needed for balance:   PWR! Twist for improved trunk rotation 2 sets of 10 reps, cues to reset in the middle   PWR! Step for improved step initiation 20 reps, cues for incr foot clearance   Pt reporting 6/10 RPE. Added remainder of PWR moves to HEP    PATIENT EDUCATION: Education details: results of miniBEST, remainder of PWR moves added to HEP (PWR Twist and Step) Person educated: Patient Education method: Explanation, Demonstration, Verbal cues, and Handouts Education comprehension: verbalized understanding and returned demonstration  HOME EXERCISE PROGRAM: Standing PWR Moves  Access Code: [email or web address] URL: [email or web address] Date: 03/07/2024 Prepared by: Sheffield Senate  Exercises - Sit to Stand  - 1-2 x daily - 4-5 x weekly - 2 sets - 10 reps  GOALS: Goals reviewed with patient? Yes  SHORT  TERM GOALS: Target date:  02/29/2024  Pt will be independent with initial HEP for improved strength, balance, transfers and gait. Baseline: Goal status: INITIAL  2.  miniBEST to be assessed with LTG written.  Baseline: 20/28 Goal status: MET  3.  Pt will improve 5x sit<>stand to less than or equal to 18 sec to demonstrate improved functional strength and transfer efficiency.   Baseline: 21.4 seconds with no UE support, last rep pt with an episode of retropulsion   15.4 seconds with no UE support  Goal status: MET    LONG TERM GOALS: Target date: 03/21/2024  Pt will verbalize understanding of local PD community resources, including fitness post DC.  Baseline:  Goal status: INITIAL  2.  Pt will improve miniBEST to at least a 23/28 in order to demo decr fall risk.  Baseline: 20/28 Goal status: INITIAL  3.  Pt will perform cog TUG in 18 seconds or less in order to demo improved dual tasking.  Baseline: 22 seconds Goal status: INITIAL  4.  Pt will improve gait speed with no AD vs. LRAD to at least 3.1 ft/sec in order to demo improved community mobility.   Baseline: 11.5 seconds with no AD = 2.85 ft/sec Goal status: INITIAL  5.  Pt will improve 5x sit<>stand to less than or equal to 16 sec to demonstrate improved functional strength and transfer efficiency.  Baseline: 21.4 seconds with no UE support, last rep pt with an episode of retropulsion  Goal status: INITIAL    ASSESSMENT:  CLINICAL IMPRESSION: Session limited today due to pt arriving late. Assessed miniBEST with pt scoring a 20/28, indicating an incr fall risk. LTG updated as appropriate. Remainder of session focused on adding PWR Twist and Step to HEP. Pt's spouse present to help with carryover at home. Pt performed well with just visual cues, will continue per POC.    OBJECTIVE IMPAIRMENTS: Abnormal gait, decreased activity tolerance, decreased balance, decreased cognition, decreased coordination, decreased endurance, decreased mobility,  difficulty walking, decreased strength, decreased safety awareness, hypomobility, impaired sensation, and postural dysfunction.   ACTIVITY LIMITATIONS: carrying, lifting, bending, stairs, transfers, and locomotion level  PARTICIPATION LIMITATIONS: driving, shopping, community activity, and yard work  PERSONAL FACTORS: Age, Behavior pattern, Past/current experiences, Time since onset of injury/illness/exacerbation, and 3+ comorbidities: s/p L3-5 prone trans-psoas approach for lateral interbody fusion; R L4-5 facetectomy and posterior segmental instrumentation L3-5 (12/2023). PMH significant for PD (complicated by tremor, gait impairment, REM sleep behavior and cognitive impairment ), HTN, DM, gout, glaucoma, PVD, orthostatic hypotension  are also affecting patient's functional outcome.   REHAB POTENTIAL: Good  CLINICAL DECISION MAKING: Evolving/moderate complexity  EVALUATION COMPLEXITY: Moderate  PLAN:  PT FREQUENCY: 2x/week  PT DURATION: 8 weeks - anticipate just 6 weeks   PLANNED INTERVENTIONS: [id number]- PT Re-evaluation, 97750- Physical Performance Testing, 97110-Therapeutic exercises, 97530- Therapeutic activity, 97112- Neuromuscular re-education, 97535- Self Care, [id number]- Manual therapy, [phone]- Gait training, Patient/Family education, Balance training, Stair training, and DME instructions  PLAN FOR NEXT SESSION: check BP!, work on wider BOS and turning tasks, stepping strategies, SLS, work on balance and core exercises after back surgery    Sheffield LOISE Senate, PT, DPT 03/10/2024, 11:53 AM

## 2024-03-14 ENCOUNTER — Ambulatory Visit: Admitting: Occupational Therapy

## 2024-03-14 ENCOUNTER — Ambulatory Visit

## 2024-03-14 ENCOUNTER — Other Ambulatory Visit: Payer: Self-pay

## 2024-03-14 ENCOUNTER — Other Ambulatory Visit (HOSPITAL_COMMUNITY): Payer: Self-pay

## 2024-03-14 VITALS — BP 109/65 | HR 58

## 2024-03-14 DIAGNOSIS — G20A2 Parkinson's disease without dyskinesia, with fluctuations: Secondary | ICD-10-CM

## 2024-03-14 DIAGNOSIS — R2681 Unsteadiness on feet: Secondary | ICD-10-CM | POA: Diagnosis not present

## 2024-03-14 DIAGNOSIS — M6281 Muscle weakness (generalized): Secondary | ICD-10-CM | POA: Diagnosis not present

## 2024-03-14 DIAGNOSIS — R41842 Visuospatial deficit: Secondary | ICD-10-CM

## 2024-03-14 DIAGNOSIS — R41840 Attention and concentration deficit: Secondary | ICD-10-CM

## 2024-03-14 DIAGNOSIS — R2689 Other abnormalities of gait and mobility: Secondary | ICD-10-CM | POA: Diagnosis not present

## 2024-03-14 DIAGNOSIS — R278 Other lack of coordination: Secondary | ICD-10-CM

## 2024-03-14 DIAGNOSIS — R29818 Other symptoms and signs involving the nervous system: Secondary | ICD-10-CM | POA: Diagnosis not present

## 2024-03-14 NOTE — Patient Instructions (Addendum)
  Toss ball or grocery bag from one hand to the other: Toss big/high.  Deliberately open with toss and deliberately close hand after catch.   Toss ball or grocery bag in the air and catch with the same hand: Toss big/high.  Deliberately open with toss and deliberately close hand after catch.   Pick up coins and place in coin bank or container: Open hand big and pick up with big, intentional movements. Do not drag coin to the edge.   Pick up coins and stack one at a time: Open hand big and pick up with big, intentional movements. Do not drag coin to the edge. (5-10 in a stack)   Pick up 5-10 coins one at a time and hold in palm. Then, move coins from palm to fingertips one at time and place in coin bank/container.    Fasten nuts/bolts or put on bottle caps: Turn as much/as big as you can with each turn.  Move wrist.

## 2024-03-14 NOTE — Therapy (Signed)
 OUTPATIENT PHYSICAL THERAPY NEURO TREATMENT   Patient Name: [name] MRN: [id number] DOB:February 27, 1951, 73 y.o., male Today's Date: 03/14/2024   PCP: [name], MD   REFERRING PROVIDER: Caleen Dirks, MD (sent to Harlene Bogaert, NP)  END OF SESSION:  PT End of Session - 03/14/24 1229     Visit Number 4    Number of Visits 13    Date for PT Re-Evaluation 04/08/24   due to potential delay in scheduling   Authorization Type [employer]    PT Start Time 1315    PT Stop Time 1410    PT Time Calculation (min) 55 min    Equipment Utilized During Treatment Gait belt    Activity Tolerance Patient tolerated treatment well    Behavior During Therapy WFL for tasks assessed/performed;Flat affect          Past Medical History:  Diagnosis Date   Allergy    seasonal   Anxiety    Arthritis    BPH (benign prostatic hyperplasia)    Complication of anesthesia    Pt. stated he had a reaction that ended in him requiring urinary cath placement; hx delirium worsening parkinson symptoms   Depression    Diverticulosis    GERD (gastroesophageal reflux disease)    esophageal spasms   Glaucoma    Gout    Head injury, closed, with concussion    Hepatitis C    chronic - Has been treated with Harvoni   HLD (hyperlipidemia)    statin intolerant (Crestor  & Simvastatin ) - Taking Livalo  1mg  / week   Hypertension    Neuromuscular disorder (HCC)    Parkinson's Disease   Parkinson's disease (HCC)    Plantar fasciitis    right   PVD (peripheral vascular disease) (HCC)    With no claudication; only mild abdominal aortic atherosclerosis noted on ultrasound.   Sleep apnea    Wears CPAP nightly   Spinal stenosis of lumbar region    Thoracic ascending aortic aneurysm (HCC)    4.2 cm ascending TAA 09/2016 CT, 1 yr f/u rec   Ulcer    Past Surgical History:  Procedure Laterality Date   ANTERIOR LAT LUMBAR FUSION N/A 12/31/2023   Procedure: Prone trans-psoas interbody fusion - Lumbar  three-Lumbar four - Lumbar four-Lumbar five, right facetectomy Lumbar four-five;  Surgeon: [name], MD;  Location: [facility];  Service: Neurosurgery;  Laterality: N/A;   BUNIONECTOMY WITH WEIL OSTEOTOMY Right 11/02/2019   Procedure: Right Foot Lapidus, Modified McBride Bunionectomy,  Hallux Akin Osteotomy;  Surgeon: [name], MD;  Location: [facility];  Service: Orthopedics;  Laterality: Right;   CARDIAC CATHETERIZATION  2005   30% Cx. Dr. Lavon   cataract surgery Left 11/05/2014   COLONOSCOPY     COLONOSCOPY N/A 01/09/2021   Procedure: COLONOSCOPY;  Surgeon: [name], MD;  Location: [facility];  Service: Gastroenterology;  Laterality: N/A;   ESOPHAGOGASTRODUODENOSCOPY N/A 05/02/2015   Procedure: ESOPHAGOGASTRODUODENOSCOPY (EGD);  Surgeon: [name], MD;  Location: [facility];  Service: Endoscopy;  Laterality: N/A;   ESOPHAGOGASTRODUODENOSCOPY  05/02/2015   no source of pt chest pain endoscopically evident. small hiatal hernia.   ESOPHAGOGASTRODUODENOSCOPY (EGD) WITH PROPOFOL  N/A 01/02/2021   Procedure: ESOPHAGOGASTRODUODENOSCOPY (EGD) WITH PROPOFOL ;  Surgeon: [name], MD;  Location: [facility];  Service: Endoscopy;  Laterality: N/A;   HARDWARE REMOVAL Right 11/02/2019   Procedure: Second Metatarsal Removal of Deep Implant and Rotational Osteotomy;  Surgeon: [name], MD;  Location:  [facility];  Service: Orthopedics;  Laterality: Right;   IR ANGIOGRAM SELECTIVE EACH ADDITIONAL VESSEL  01/04/2021   IR ANGIOGRAM SELECTIVE EACH ADDITIONAL VESSEL  01/04/2021   IR ANGIOGRAM VISCERAL SELECTIVE  01/04/2021   IR US  GUIDE VASC ACCESS RIGHT  01/04/2021   LUMBAR LAMINECTOMY/DECOMPRESSION MICRODISCECTOMY N/A 07/03/2021   Procedure: Lumbar three through five decompression with lumbar three through five insitu fusion;  Surgeon: [name], MD;  Location: [facility];  Service: Orthopedics;  Laterality: N/A;   LUMBAR PERCUTANEOUS PEDICLE SCREW 2  LEVEL N/A 12/31/2023   Procedure: LUMBAR PERCUTANEOUS PEDICLE SCREW LUMBAR THREE-LUMBAR FIVE;  Surgeon: [name], MD;  Location: [facility];  Service: Neurosurgery;  Laterality: N/A;   MEMBRANE PEEL Left 03/14/2014   Procedure: MEMBRANE PEEL; ENDOLASER;  Surgeon: [name], MD;  Location: [facility];  Service: Ophthalmology;  Laterality: Left;   NM MYOVIEW  LTD  10/2015   LOW RISK. Small, fixed basal lateral defect - likely diaphragmatic attenuation. EF 69%   PARS PLANA VITRECTOMY Left 03/14/2014   Procedure: PARS PLANA VITRECTOMY WITH 25 GAUGE;  Surgeon: [name], MD;  Location: [facility] OR;  Service: Ophthalmology;  Laterality: Left;   shave  02/03/2022   angiofibroma   TONSILLECTOMY     TRANSTHORACIC ECHOCARDIOGRAM  01/07/2021   Normal EF 60 to 65%.  No R WMA.  GRII DD-moderately dilated LA..  Mildly dilated RV but normal function.  Normal RAP/CVP.  Trivial AI with mild to moderate sclerosis-no stenosis   UMBILICAL HERNIA REPAIR  2023   UPPER GASTROINTESTINAL ENDOSCOPY     WEIL OSTEOTOMY Right 09/01/2017   Procedure: RIGHT GREAT TOE CHEVRON AND WEIL OSTEOTOMY 2ND METATARSAL;  Surgeon: [name], MD;  Location: [facility];  Service: Orthopedics;  Laterality: Right;   XI ROBOTIC ASSISTED VENTRAL HERNIA N/A 03/17/2022   Procedure: XI ROBOTIC ASSISTED VENTRAL HERNIA;  Surgeon: [name], MD;  Location: [facility];  Service: General;  Laterality: N/A;   Patient Active Problem List   Diagnosis Date Noted   Chronic hepatitis C (HCC) 03/09/2024   Malnutrition of moderate degree 01/06/2024   Spondylolisthesis at L4-L5 level 12/31/2023   Supine hypertension 12/03/2023   Parkinson's disease with neurogenic orthostatic hypotension (HCC) 12/03/2023   Preop cardiovascular exam 12/03/2023   Glaucoma 11/30/2023   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   Acquired hallux rigidus of right foot 11/23/2022   Ventral hernia without obstruction or gangrene    Primary open angle glaucoma of left eye, mild  stage 06/23/2021   Facial numbness 12/24/2020   Neovascular glaucoma, right eye, stage unspecified 12/16/2020   Lumbar radiculopathy 11/25/2020   Gastroesophageal reflux disease 09/10/2020   Dry eyes, bilateral 08/15/2020   Presbycusis of right ear 01/17/2020   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   Secondary corneal edema of right eye 12/12/2019   Ptosis of right eyelid 12/12/2019   OAB (overactive bladder) 11/29/2019   Bunion of great toe of right foot 07/14/2017   Claw toe, acquired, right 07/14/2017   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   Medication management 10/09/2016   Plantar fasciitis of right foot 02/19/2015   Depression 08/14/2014   Family history of heart disease in male family member before age 10 04/26/2014   Mild cognitive impairment 03/13/2014   Parkinsonian tremor (HCC) 02/12/2014   Hyperlipidemia with target LDL less than 100    Abdominal aortic atherosclerosis (HCC)    Moderate essential hypertension 02/25/2011   Arthropathy 02/25/2011  HAV (hallux abducto valgus) 02/25/2011    ONSET DATE: 01/19/2024  REFERRING DIAG: M43.16 (ICD-10-CM) - Spondylolisthesis, lumbar region  THERAPY DIAG:  Muscle weakness (generalized)  Unsteadiness on feet  Parkinson's disease without dyskinesia, with fluctuating manifestations (HCC)  Rationale for Evaluation and Treatment: Rehabilitation  SUBJECTIVE:                                                                                                                                                                                             SUBJECTIVE STATEMENT: Pt reports pain in back is 1-2/10. No falls. Not using AD.  Pt accompanied by: self  PERTINENT HISTORY: PMH: s/p L3-5 prone trans-psoas approach for lateral interbody fusion; R L4-5 facetectomy and posterior segmental instrumentation L3-5 (12/2023). PMH significant for PD (complicated by  tremor, gait impairment, REM sleep behavior and cognitive impairment ), HTN, DM, gout, glaucoma, PVD   Per note from [facility]: He underwent back surgery on 6/6 (L2-5 lateral interbody fusion with posterior instrumentation) with postop confusion and delirium requiring restraints,  improved with use of Seroquel  and discontinuing opioids and muscle relaxants, suspected confusion due to GA, PD and possibly medication side effects.  He was discharged on Seroquel  25/50.  He was discharged to [facility] rehab on 6/18, has not yet started therapies. He continues to have occasional confusion although some what improving per wife, at times with paranoia and visual hallucinations, also has been wandering and currently wearing locator band on ankle   PAIN:  Are you having pain? No and a little soreness from doing the exercises   Vitals:   03/14/24 1322  BP: 109/65  Pulse: (!) 58       PRECAUTIONS: Back, Fall, and Other: hx of orthostatics, pt's spouse has to remind him of his precautions    FALLS: Has patient fallen in last 6 months? Yes. Number of falls 1, had a fall getting up and fell back against the window, this happened about a week or 2 before the surgery   LIVING ENVIRONMENT: Lives with: lives with their spouse Lives in: House/apartment Stairs: Yes: Internal: 15 steps; on right going up and External: 2 on the front porch and 3 on the side steps; can reach both, doesn't have to go up indoor steps, can live on the bottom floor  Has following equipment at home: Walker - 2 wheeled, shower chair, and Grab bars  PLOF: Independent and Leisure: music, traveling Pt's spouse has to remind him of brace precautions for ADLs   PATIENT GOALS: Wants to be able to be more independent, stronger, and more mobile   OBJECTIVE:  Note: Objective measures were  completed at Evaluation unless otherwise noted.  COGNITION: Overall cognitive status: Impaired   SENSATION: Light touch: WFL Reports  numbness/tingling intermittently down front of BLE   COORDINATION: Heel to shin: slightly harder with LLE   POSTURE: rounded shoulders and forward head   LOWER EXTREMITY MMT:    MMT Right Eval Left Eval  Hip flexion 5 4+  Hip extension    Hip abduction 4 4  Hip adduction 4+ 4+  Hip internal rotation    Hip external rotation    Knee flexion 4+ 4+  Knee extension 4 -4  Ankle dorsiflexion 5 5  Ankle plantarflexion    Ankle inversion    Ankle eversion    (Blank rows = not tested)  All tested in sitting   BED MOBILITY:  Pt reports will feel stuck trying to get his legs in and out of the bed. Reviewed the log roll technique for bed mobility (PT demonstrating on the bed)  TRANSFERS: Sit to stand: SBA  Assistive device utilized: None     Stand to sit: SBA  Assistive device utilized: None      Mainly performs with UE support, but can perform without, with intermittent retropulsion  GAIT: Findings: Gait Characteristics: step through pattern, decreased arm swing- Right, decreased arm swing- Left, decreased stride length, decreased ankle dorsiflexion- Right, decreased ankle dorsiflexion- Left, Right foot flat, Left foot flat, shuffling, decreased trunk rotation, and narrow BOS, Distance walked: Clinic distances , Assistive device utilized:None, Level of assistance: SBA and CGA, and Comments: intermittent CGA during turns due to pt with narrow BOS   FUNCTIONAL TESTS:  5 times sit to stand: 21.4 seconds with no UE support, last rep pt with an episode of retropulsion  10 meter walk test: 11.5 seconds with no AD = 2.85 ft/sec     [facility] PT Assessment - 02/08/24 1440       Timed Up and Go Test   Normal TUG (seconds) 11.8    Manual TUG (seconds) 12.8    Cognitive TUG (seconds) 22   attempting retro counting by 3s, pt tends to add instead of subtract   TUG Comments pt with narrow BOS when turning with intermittent CGA                                                                                                                                       TREATMENT DATE: 03/14/24    Vitals:   03/14/24 1322  BP: 109/65  Pulse: (!) 58   Pt received from OT. BP slightly low but within therapeutic ranges. Pt educated to wait 10 sec before he changes position from supine to sit or sit to stand before he proceeds to stand/walk   PT asked to move from supine to sidelying on R: slow movements noted with rigidity and lack of coordinated movement of UE and LE body  Performed PNF patterns in supine to sidelying: pt  asked to turn to side bringing ipsilateral elbows and knees together with loud Go before he moves> performed 2 x 10 R and L  Pt able to perform supine to L sidelying quickly and able to come to sit up position without any freezing   Gait training: 1 x 460' without AD, needed cues for arm swings but pt able to maintain arm swings I for only few steps before he stopped swinging his arms.  Gait training: 1 x 460' with 2x walking sticks to improve UE swings and coordinated efforts. Pt demo improved bil foot clearance, decreased staggering of his feet, and consistent step length with possible increase in his cadence  Pt was educated on benefits of using 2 walking sticks for community ambulation and gait training.   Pt was then walked to his car and patient and wife were both educated on importance of walking sticks. We will try in PT for couple more times before they consider purchasing it.   PATIENT EDUCATION: Education details: results of miniBEST, remainder of PWR moves added to HEP (PWR Twist and Step) Person educated: Patient Education method: Explanation, Demonstration, Verbal cues, and Handouts Education comprehension: verbalized understanding and returned demonstration  HOME EXERCISE PROGRAM: Standing PWR Moves  Access Code: [email or web address] URL: [email or web address] Date: 03/07/2024 Prepared by: Sheffield Senate  Exercises - Sit to Stand  -  1-2 x daily - 4-5 x weekly - 2 sets - 10 reps  GOALS: Goals reviewed with patient? Yes  SHORT TERM GOALS: Target date: 02/29/2024  Pt will be independent with initial HEP for improved strength, balance, transfers and gait. Baseline: Goal status: INITIAL  2.  miniBEST to be assessed with LTG written.  Baseline: 20/28 Goal status: MET  3.  Pt will improve 5x sit<>stand to less than or equal to 18 sec to demonstrate improved functional strength and transfer efficiency.   Baseline: 21.4 seconds with no UE support, last rep pt with an episode of retropulsion   15.4 seconds with no UE support  Goal status: MET    LONG TERM GOALS: Target date: 03/21/2024  Pt will verbalize understanding of local PD community resources, including fitness post DC.  Baseline:  Goal status: INITIAL  2.  Pt will improve miniBEST to at least a 23/28 in order to demo decr fall risk.  Baseline: 20/28 Goal status: INITIAL  3.  Pt will perform cog TUG in 18 seconds or less in order to demo improved dual tasking.  Baseline: 22 seconds Goal status: INITIAL  4.  Pt will improve gait speed with no AD vs. LRAD to at least 3.1 ft/sec in order to demo improved community mobility.   Baseline: 11.5 seconds with no AD = 2.85 ft/sec Goal status: INITIAL  5.  Pt will improve 5x sit<>stand to less than or equal to 16 sec to demonstrate improved functional strength and transfer efficiency.  Baseline: 21.4 seconds with no UE support, last rep pt with an episode of retropulsion  Goal status: INITIAL    ASSESSMENT:  CLINICAL IMPRESSION: Today's session focused on large amplitude movements in supine position to decrease freezing with functional transfers and gait. Pt demo significant improvement in his gait. Pt demo further improvement when he used 2 walking sticks.Pt will benefit from training with 2 waking sticks for gait training and to help decide if he will be compliant with using them for community ambulation.     OBJECTIVE IMPAIRMENTS: Abnormal gait, decreased activity tolerance, decreased balance, decreased cognition, decreased coordination, decreased endurance, decreased  mobility, difficulty walking, decreased strength, decreased safety awareness, hypomobility, impaired sensation, and postural dysfunction.   ACTIVITY LIMITATIONS: carrying, lifting, bending, stairs, transfers, and locomotion level  PARTICIPATION LIMITATIONS: driving, shopping, community activity, and yard work  PERSONAL FACTORS: Age, Behavior pattern, Past/current experiences, Time since onset of injury/illness/exacerbation, and 3+ comorbidities: s/p L3-5 prone trans-psoas approach for lateral interbody fusion; R L4-5 facetectomy and posterior segmental instrumentation L3-5 (12/2023). PMH significant for PD (complicated by tremor, gait impairment, REM sleep behavior and cognitive impairment ), HTN, DM, gout, glaucoma, PVD, orthostatic hypotension  are also affecting patient's functional outcome.   REHAB POTENTIAL: Good  CLINICAL DECISION MAKING: Evolving/moderate complexity  EVALUATION COMPLEXITY: Moderate  PLAN:  PT FREQUENCY: 2x/week  PT DURATION: 8 weeks - anticipate just 6 weeks   PLANNED INTERVENTIONS: 97164- PT Re-evaluation, 97750- Physical Performance Testing, 97110-Therapeutic exercises, 97530- Therapeutic activity, 97112- Neuromuscular re-education, 97535- Self Care, [id number]- Manual therapy, [phone]- Gait training, Patient/Family education, Balance training, Stair training, and DME instructions  PLAN FOR NEXT SESSION: check BP!, work on wider BOS and turning tasks, stepping strategies, SLS, work on balance and core exercises after back surgery    Raj LOISE Blanch, PT, DPT 03/14/2024, 2:37 PM

## 2024-03-14 NOTE — Therapy (Unsigned)
 OUTPATIENT OCCUPATIONAL THERAPY PARKINSON'S TREATMENT  Patient Name: [name] MRN: [id number] DOB:October 16, 1950, 73 y.o., male Today's Date: 03/14/2024  PCP: [name], MD REFERRING PROVIDER: Caleen Dirks, MD  END OF SESSION:  OT End of Session - 03/14/24 1239     Visit Number 6    Number of Visits 16    Date for OT Re-Evaluation 04/10/24    Authorization Type [employer] 2025 - Auth required    OT Start Time 1238    OT Stop Time 1316    OT Time Calculation (min) 38 min    Activity Tolerance Patient tolerated treatment well    Behavior During Therapy WFL for tasks assessed/performed         Past Medical History:  Diagnosis Date   Allergy    seasonal   Anxiety    Arthritis    BPH (benign prostatic hyperplasia)    Complication of anesthesia    Pt. stated he had a reaction that ended in him requiring urinary cath placement; hx delirium worsening parkinson symptoms   Depression    Diverticulosis    GERD (gastroesophageal reflux disease)    esophageal spasms   Glaucoma    Gout    Head injury, closed, with concussion    Hepatitis C    chronic - Has been treated with Harvoni   HLD (hyperlipidemia)    statin intolerant (Crestor  & Simvastatin ) - Taking Livalo  1mg  / week   Hypertension    Neuromuscular disorder (HCC)    Parkinson's Disease   Parkinson's disease (HCC)    Plantar fasciitis    right   PVD (peripheral vascular disease) (HCC)    With no claudication; only mild abdominal aortic atherosclerosis noted on ultrasound.   Sleep apnea    Wears CPAP nightly   Spinal stenosis of lumbar region    Thoracic ascending aortic aneurysm (HCC)    4.2 cm ascending TAA 09/2016 CT, 1 yr f/u rec   Ulcer    Past Surgical History:  Procedure Laterality Date   ANTERIOR LAT LUMBAR FUSION N/A 12/31/2023   Procedure: Prone trans-psoas interbody fusion - Lumbar three-Lumbar four - Lumbar four-Lumbar five, right facetectomy Lumbar four-five;  Surgeon: [name], MD;   Location: [facility];  Service: Neurosurgery;  Laterality: N/A;   BUNIONECTOMY WITH WEIL OSTEOTOMY Right 11/02/2019   Procedure: Right Foot Lapidus, Modified McBride Bunionectomy,  Hallux Akin Osteotomy;  Surgeon: [name], MD;  Location: [facility];  Service: Orthopedics;  Laterality: Right;   CARDIAC CATHETERIZATION  2005   30% Cx. Dr. Lavon   cataract surgery Left 11/05/2014   COLONOSCOPY     COLONOSCOPY N/A 01/09/2021   Procedure: COLONOSCOPY;  Surgeon: [name], MD;  Location: [facility];  Service: Gastroenterology;  Laterality: N/A;   ESOPHAGOGASTRODUODENOSCOPY N/A 05/02/2015   Procedure: ESOPHAGOGASTRODUODENOSCOPY (EGD);  Surgeon: [name], MD;  Location: [facility];  Service: Endoscopy;  Laterality: N/A;   ESOPHAGOGASTRODUODENOSCOPY  05/02/2015   no source of pt chest pain endoscopically evident. small hiatal hernia.   ESOPHAGOGASTRODUODENOSCOPY (EGD) WITH PROPOFOL  N/A 01/02/2021   Procedure: ESOPHAGOGASTRODUODENOSCOPY (EGD) WITH PROPOFOL ;  Surgeon: [name], MD;  Location: [facility];  Service: Endoscopy;  Laterality: N/A;   HARDWARE REMOVAL Right 11/02/2019   Procedure: Second Metatarsal Removal of Deep Implant and Rotational Osteotomy;  Surgeon: [name], MD;  Location: [facility];  Service: Orthopedics;  Laterality: Right;   IR ANGIOGRAM SELECTIVE EACH ADDITIONAL VESSEL  01/04/2021   IR ANGIOGRAM SELECTIVE EACH  ADDITIONAL VESSEL  01/04/2021   IR ANGIOGRAM VISCERAL SELECTIVE  01/04/2021   IR US  GUIDE VASC ACCESS RIGHT  01/04/2021   LUMBAR LAMINECTOMY/DECOMPRESSION MICRODISCECTOMY N/A 07/03/2021   Procedure: Lumbar three through five decompression with lumbar three through five insitu fusion;  Surgeon: [name], MD;  Location: [facility] OR;  Service: Orthopedics;  Laterality: N/A;   LUMBAR PERCUTANEOUS PEDICLE SCREW 2 LEVEL N/A 12/31/2023   Procedure: LUMBAR PERCUTANEOUS PEDICLE SCREW LUMBAR THREE-LUMBAR FIVE;  Surgeon: [name], MD;  Location: [facility];  Service: Neurosurgery;  Laterality: N/A;   MEMBRANE PEEL Left 03/14/2014   Procedure: MEMBRANE PEEL; ENDOLASER;  Surgeon: [name], MD;  Location: [facility];  Service: Ophthalmology;  Laterality: Left;   NM MYOVIEW  LTD  10/2015   LOW RISK. Small, fixed basal lateral defect - likely diaphragmatic attenuation. EF 69%   PARS PLANA VITRECTOMY Left 03/14/2014   Procedure: PARS PLANA VITRECTOMY WITH 25 GAUGE;  Surgeon: [name], MD;  Location: [facility] OR;  Service: Ophthalmology;  Laterality: Left;   shave  02/03/2022   angiofibroma   TONSILLECTOMY     TRANSTHORACIC ECHOCARDIOGRAM  01/07/2021   Normal EF 60 to 65%.  No R WMA.  GRII DD-moderately dilated LA..  Mildly dilated RV but normal function.  Normal RAP/CVP.  Trivial AI with mild to moderate sclerosis-no stenosis   UMBILICAL HERNIA REPAIR  2023   UPPER GASTROINTESTINAL ENDOSCOPY     WEIL OSTEOTOMY Right 09/01/2017   Procedure: RIGHT GREAT TOE CHEVRON AND WEIL OSTEOTOMY 2ND METATARSAL;  Surgeon: [name], MD;  Location: [facility];  Service: Orthopedics;  Laterality: Right;   XI ROBOTIC ASSISTED VENTRAL HERNIA N/A 03/17/2022   Procedure: XI ROBOTIC ASSISTED VENTRAL HERNIA;  Surgeon: [name], MD;  Location: [facility];  Service: General;  Laterality: N/A;   Patient Active Problem List   Diagnosis Date Noted   Chronic hepatitis C (HCC) 03/09/2024   Malnutrition of moderate degree 01/06/2024   Spondylolisthesis at L4-L5 level 12/31/2023   Supine hypertension 12/03/2023   Parkinson's disease with neurogenic orthostatic hypotension (HCC) 12/03/2023   Preop cardiovascular exam 12/03/2023   Glaucoma 11/30/2023   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   Acquired hallux rigidus of right foot 11/23/2022   Ventral hernia without obstruction or gangrene    Primary open angle glaucoma of left eye, mild stage 06/23/2021   Facial numbness 12/24/2020   Neovascular glaucoma, right eye, stage unspecified 12/16/2020    Lumbar radiculopathy 11/25/2020   Gastroesophageal reflux disease 09/10/2020   Dry eyes, bilateral 08/15/2020   Presbycusis of right ear 01/17/2020   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   Secondary corneal edema of right eye 12/12/2019   Ptosis of right eyelid 12/12/2019   OAB (overactive bladder) 11/29/2019   Bunion of great toe of right foot 07/14/2017   Claw toe, acquired, right 07/14/2017   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   Medication management 10/09/2016   Plantar fasciitis of right foot 02/19/2015   Depression 08/14/2014   Family history of heart disease in male family member before age 85 04/26/2014   Mild cognitive impairment 03/13/2014   Parkinsonian tremor (HCC) 02/12/2014   Hyperlipidemia with target LDL less than 100    Abdominal aortic atherosclerosis (HCC)    Moderate essential hypertension 02/25/2011   Arthropathy 02/25/2011   HAV (hallux abducto valgus) 02/25/2011   ONSET DATE: 01/21/2024 (referral date)   REFERRING DIAG: M43.16 (ICD-10-CM) - Spondylolisthesis, lumbar  region  THERAPY DIAG:  Other symptoms and signs involving the nervous system  Muscle weakness (generalized)  Other lack of coordination  Visuospatial deficit  Parkinson's disease without dyskinesia, with fluctuating manifestations (HCC)  Attention and concentration deficit  Rationale for Evaluation and Treatment: Rehabilitation  SUBJECTIVE:   SUBJECTIVE STATEMENT: Pt reports he does not remember playing Golf Solitaire. He did not bring his PD binder and is still waiting for his glasses to be fixed.   Pt accompanied by: self  PERTINENT HISTORY: recent back surgery (L3-5) on 12/31/23, Parkinson's disease causing autonomic dysregulation and orthostatic hypotension, leading to blood pressure fluctuations and dizziness upon standing. Lumbar radiculopathy (Chronic), PVD, HLD, spinal stenosis, BPH    Post-surgical recovery complicated by inappropriate medication management and inadequate rehabilitation, with delirium post-anesthesia likely exacerbated by Parkinson's disease.    PRECAUTIONS: Other: can take off back brace when seated or in bed and some walking in house, but wear it mostly when up. Back precautions (no BLT), no lifting over 10 lbs, orthostatic hypotension   WEIGHT BEARING RESTRICTIONS: No  PAIN:  Are you having pain? Yes: NPRS scale: 1-3/10  Pain location: back pain Pain description: fluctuates Aggravating factors: staying in one position too long Relieving factors: changing positions, OTC Meds  FALLS: Has patient fallen in last 6 months? Yes. Number of falls 1  LIVING ENVIRONMENT: Lives with: lives with their spouse Lives in: 2 story home, but lives on first floor, 3 steps to enter Has following equipment at home: Vannie - 2 wheeled, shower chair, and Grab bars  PLOF: Needs assistance with ADLs prior to surgery, enjoys music and traveling  PATIENT GOALS: improve function and speed with ADLS including handwriting  OBJECTIVE:  Note: Objective measures were completed at Evaluation unless otherwise noted.  BP assessed and [facility] for therapy visit.   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  MOBILITY STATUS: Independent  POSTURE COMMENTS:  increased lumbar lordosis, increased  thoracic kyphosis, and posterior pelvic tilt   FUNCTIONAL OUTCOME MEASURES: Fastening/unfastening 3 buttons: 53 sec Physical performance test: PPT#2 (simulated eating) 27.21 & PPT#4 (donning/doffing jacket): 14.12 sec (w/ hospital gown)   COORDINATION: 9 Hole Peg test: Right: 31.60 sec; Left: 41.77 sec Box and Blocks:  Right: 37 blocks; Left: 37 blocks  UE ROM:  WFL  SENSATION: Fingertips get cold   COGNITION: Overall cognitive status: Impaired and decreased memory, processing speed  VISION:  Pt/family reports can only see light and images from Rt eye (? Legally blind), glaucoma and decreased vision Lt eye  OBSERVATIONS: Bradykinesia, Hypokinesia, and decline in cognition and overall function since back surgery  TREATMENT:   OT educated pt on table top play of Golf Solitaire for RUE and LUE to address fine motor coordination, gross motor coordination, upper extremity range of motion, reaction time, scanning and locating of items, processing, bimanual coordination/trunk control, sequencing of unfamiliar motor movements or tasks, motor planning, item/pattern recognition, and endurance/stamina. Pt required minimal cues for proper play.  Coordination HEP initiated as noted in pt instructions in addition to golf solitaire and dominoes.  PATIENT EDUCATION: Education details: Manufacturing systems engineer; coordination HEP Person educated: Patient Education method: Explanation, Demonstration, Verbal cues, and Handouts Education comprehension: verbalized understanding, returned demonstration, verbal cues required, and needs further education   HOME EXERCISE PROGRAM: 02/17/2024: handwriting changes 02/24/2024: new behind head bag exercise handout 03/07/2024: Exercise Chart 03/14/2024: golf solitaire; coordination HEP  GOALS: Goals reviewed with patient? Yes  SHORT TERM GOALS: Target date:  03/10/24  Independent with PD specific HEP (Will need to adapt d/t current back precautions, consider adding bag ex's for ADLS) Baseline: Goal status: IN PROGRESS  2.  Pt to write name with 90% or greater legibility with only min micrographia Baseline:  Goal status: IN PROGRESS  3. Pt will demonstrate improved ease with feeding as evidenced by decreasing PPT#2 by 8 secs or more Baseline: 27.21 sec Goal status: INITIAL  4.  Pt will demonstrate improved ease with fastening buttons as evidenced by decreasing 3 button/unbutton time by 5 seconds or more Baseline: 53 sec Goal status: INITIAL  5.  Pt will be able to place at least 3 additional blocks bilaterally with completion of Box and Blocks test. Baseline: Right: 37 blocks; Left: 37 blocks Goal status: INITIAL   LONG TERM GOALS: Target date: 04/10/24  Pt will verbalize understanding of ways to prevent future PD related complications and appropriate community resources prn  Baseline:  Goal status: INITIAL  2.  Pt will verbalize understanding of ways to keep thinking skills sharp and ways to compensate for STM changes in the future  Baseline:  Goal status: INITIAL  3.  Pt will verbalize understanding of adaptive strategies to increase ease with ADLS/IADLS   Baseline:  Goal status: INITIAL  4.  Pt will write a sentence with no significant decrease in size and maintain 90% legibility  Baseline:  Goal status: INITIAL  5.  Pt will demonstrate improved fine motor coordination for ADLs as evidenced by decreasing 9 hole peg test score for Lt hand by 6 secs  Baseline: 41.77 sec.  Goal status: INITIAL  6.  Pt will demonstrate improved fine motor coordination for ADLs as evidenced by decreasing 9 hole peg test score for Rt hand by 3 secs  Baseline: 31.60 sec Goal status: INITIAL  ASSESSMENT:  CLINICAL IMPRESSION: Patient demonstrates poor recall of prior therapy activities. Today his cognition seems more limiting. Pt also limited  this session as he is without glasses. Will continue to progress towards current goals.   PERFORMANCE DEFICITS: in functional skills including ADLs, IADLs, coordination, dexterity, proprioception, sensation, ROM, strength, pain, Fine motor control, Gross motor control, hearing, mobility, balance, body mechanics, endurance, decreased knowledge of use of DME, vision, and UE functional use, cognitive skills including memory and safety awareness, and psychosocial skills including coping strategies.   IMPAIRMENTS: are limiting patient from ADLs, IADLs, rest and sleep, leisure, and social participation.   COMORBIDITIES:  has co-morbidities such as recent back surgery with back precautions, orthostatic hypotension, fluctuating BP that affects occupational performance. Patient will benefit from skilled OT to address above impairments and improve overall function.  REHAB POTENTIAL: Good  PLAN:  OT FREQUENCY: 2x/week  OT DURATION: 8 weeks  PLANNED INTERVENTIONS: 97535 self care/ADL training, 02889 therapeutic exercise, 97530 therapeutic activity, 97112 neuromuscular re-education, 97140 manual therapy, 97113 aquatic therapy, 97035 ultrasound, 97010 moist heat, 97129 Cognitive training (first 15 min), 02869 Cognitive training(each additional 15 min), 02249 Physical Performance Testing, passive range of motion, functional mobility training, visual/perceptual remediation/compensation, energy conservation, coping strategies training, patient/family education, and DME and/or AE instructions  RECOMMENDED OTHER SERVICES: will request speech therapy referral but will wait to make speech evaluation until after pt gets new hearing aids  CONSULTED AND AGREED WITH PLAN OF CARE: Patient and family member/caregiver  PLAN FOR NEXT SESSION: update PD binder; monitor BP (fluctuates),  ADLS; Bag exs for dressing (new handout with pictures?); vision options for phone - Aleyza Salmi - did he practice with new mag? (Does not prefer  [employer]); TREMOR Reduction strategies handout   Jocelyn CHRISTELLA Bottom, OT 03/14/2024, 6:48 PM

## 2024-03-15 ENCOUNTER — Other Ambulatory Visit: Payer: Self-pay

## 2024-03-16 ENCOUNTER — Ambulatory Visit: Admitting: Physical Therapy

## 2024-03-16 ENCOUNTER — Ambulatory Visit: Admitting: Occupational Therapy

## 2024-03-16 DIAGNOSIS — R29818 Other symptoms and signs involving the nervous system: Secondary | ICD-10-CM

## 2024-03-16 DIAGNOSIS — R2689 Other abnormalities of gait and mobility: Secondary | ICD-10-CM | POA: Diagnosis not present

## 2024-03-16 DIAGNOSIS — R278 Other lack of coordination: Secondary | ICD-10-CM | POA: Diagnosis not present

## 2024-03-16 DIAGNOSIS — R41842 Visuospatial deficit: Secondary | ICD-10-CM

## 2024-03-16 DIAGNOSIS — M6281 Muscle weakness (generalized): Secondary | ICD-10-CM | POA: Diagnosis not present

## 2024-03-16 DIAGNOSIS — R41840 Attention and concentration deficit: Secondary | ICD-10-CM

## 2024-03-16 DIAGNOSIS — G20A2 Parkinson's disease without dyskinesia, with fluctuations: Secondary | ICD-10-CM

## 2024-03-16 DIAGNOSIS — R2681 Unsteadiness on feet: Secondary | ICD-10-CM | POA: Diagnosis not present

## 2024-03-16 NOTE — Patient Instructions (Addendum)
 BUTTONING  Open hands big before fastening or unfastening each button. Use deliberate movements - angry buttons.  Button by using push-pull method - push button through hole with one hand and pull the fabric back with the other hand.   Unfasten by using pull-push method - pull the fabric back with one hand and push the button through with the other.      To DON jacket:   1) Wide stance 2) Hold facing tag out with hands shoulder width apart at sleeves 3) Swing around back big like a cape to RIGHT side 4) Punch RIGHT arm in as you continue to pull behind you with LEFT hand to LEFT 5) Punch LEFT arm in   To DOFF jacket:   1) Wide stance 2) Hold sides of jacket with big hands at belly button level 3 opt1) Throw arms behind you like PWR! Up 3 opt 1) Slide jacket down arms 4) Reach big behind you and grab cuff of sleeve BIG and yank off one hand, then the other

## 2024-03-16 NOTE — Therapy (Unsigned)
 OUTPATIENT OCCUPATIONAL THERAPY PARKINSON'S TREATMENT  Patient Name: [name] MRN: [id number] DOB:Dec 09, 1950, 73 y.o., male Today's Date: 03/16/2024  PCP: [name], MD REFERRING PROVIDER: Caleen Dirks, MD  END OF SESSION:  OT End of Session - 03/16/24 1403     Visit Number 7    Number of Visits 16    Date for OT Re-Evaluation 04/10/24    Authorization Type [employer] 2025 - Auth required    OT Start Time 1237    OT Stop Time 1316    OT Time Calculation (min) 39 min    Activity Tolerance Patient tolerated treatment well    Behavior During Therapy WFL for tasks assessed/performed         Past Medical History:  Diagnosis Date   Allergy    seasonal   Anxiety    Arthritis    BPH (benign prostatic hyperplasia)    Complication of anesthesia    Pt. stated he had a reaction that ended in him requiring urinary cath placement; hx delirium worsening parkinson symptoms   Depression    Diverticulosis    GERD (gastroesophageal reflux disease)    esophageal spasms   Glaucoma    Gout    Head injury, closed, with concussion    Hepatitis C    chronic - Has been treated with Harvoni   HLD (hyperlipidemia)    statin intolerant (Crestor  & Simvastatin ) - Taking Livalo  1mg  / week   Hypertension    Neuromuscular disorder (HCC)    Parkinson's Disease   Parkinson's disease (HCC)    Plantar fasciitis    right   PVD (peripheral vascular disease) (HCC)    With no claudication; only mild abdominal aortic atherosclerosis noted on ultrasound.   Sleep apnea    Wears CPAP nightly   Spinal stenosis of lumbar region    Thoracic ascending aortic aneurysm (HCC)    4.2 cm ascending TAA 09/2016 CT, 1 yr f/u rec   Ulcer    Past Surgical History:  Procedure Laterality Date   ANTERIOR LAT LUMBAR FUSION N/A 12/31/2023   Procedure: Prone trans-psoas interbody fusion - Lumbar three-Lumbar four - Lumbar four-Lumbar five, right facetectomy Lumbar four-five;  Surgeon: [name], MD;   Location: [facility];  Service: Neurosurgery;  Laterality: N/A;   BUNIONECTOMY WITH WEIL OSTEOTOMY Right 11/02/2019   Procedure: Right Foot Lapidus, Modified McBride Bunionectomy,  Hallux Akin Osteotomy;  Surgeon: [name], MD;  Location: [facility];  Service: Orthopedics;  Laterality: Right;   CARDIAC CATHETERIZATION  2005   30% Cx. Dr. Lavon   cataract surgery Left 11/05/2014   COLONOSCOPY     COLONOSCOPY N/A 01/09/2021   Procedure: COLONOSCOPY;  Surgeon: [name], MD;  Location: [facility];  Service: Gastroenterology;  Laterality: N/A;   ESOPHAGOGASTRODUODENOSCOPY N/A 05/02/2015   Procedure: ESOPHAGOGASTRODUODENOSCOPY (EGD);  Surgeon: [name], MD;  Location: [facility];  Service: Endoscopy;  Laterality: N/A;   ESOPHAGOGASTRODUODENOSCOPY  05/02/2015   no source of pt chest pain endoscopically evident. small hiatal hernia.   ESOPHAGOGASTRODUODENOSCOPY (EGD) WITH PROPOFOL  N/A 01/02/2021   Procedure: ESOPHAGOGASTRODUODENOSCOPY (EGD) WITH PROPOFOL ;  Surgeon: [name], MD;  Location: [facility];  Service: Endoscopy;  Laterality: N/A;   HARDWARE REMOVAL Right 11/02/2019   Procedure: Second Metatarsal Removal of Deep Implant and Rotational Osteotomy;  Surgeon: [name], MD;  Location: [facility];  Service: Orthopedics;  Laterality: Right;   IR ANGIOGRAM SELECTIVE EACH ADDITIONAL VESSEL  01/04/2021   IR ANGIOGRAM SELECTIVE EACH  ADDITIONAL VESSEL  01/04/2021   IR ANGIOGRAM VISCERAL SELECTIVE  01/04/2021   IR US  GUIDE VASC ACCESS RIGHT  01/04/2021   LUMBAR LAMINECTOMY/DECOMPRESSION MICRODISCECTOMY N/A 07/03/2021   Procedure: Lumbar three through five decompression with lumbar three through five insitu fusion;  Surgeon: [name], MD;  Location: [facility] OR;  Service: Orthopedics;  Laterality: N/A;   LUMBAR PERCUTANEOUS PEDICLE SCREW 2 LEVEL N/A 12/31/2023   Procedure: LUMBAR PERCUTANEOUS PEDICLE SCREW LUMBAR THREE-LUMBAR FIVE;  Surgeon: [name], MD;  Location: [facility];  Service: Neurosurgery;  Laterality: N/A;   MEMBRANE PEEL Left 03/14/2014   Procedure: MEMBRANE PEEL; ENDOLASER;  Surgeon: [name], MD;  Location: [facility];  Service: Ophthalmology;  Laterality: Left;   NM MYOVIEW  LTD  10/2015   LOW RISK. Small, fixed basal lateral defect - likely diaphragmatic attenuation. EF 69%   PARS PLANA VITRECTOMY Left 03/14/2014   Procedure: PARS PLANA VITRECTOMY WITH 25 GAUGE;  Surgeon: [name], MD;  Location[facility] OR;  Service: Ophthalmology;  Laterality: Left;   shave  02/03/2022   angiofibroma   TONSILLECTOMY     TRANSTHORACIC ECHOCARDIOGRAM  01/07/2021   Normal EF 60 to 65%.  No R WMA.  GRII DD-moderately dilated LA..  Mildly dilated RV but normal function.  Normal RAP/CVP.  Trivial AI with mild to moderate sclerosis-no stenosis   UMBILICAL HERNIA REPAIR  2023   UPPER GASTROINTESTINAL ENDOSCOPY     WEIL OSTEOTOMY Right 09/01/2017   Procedure: RIGHT GREAT TOE CHEVRON AND WEIL OSTEOTOMY 2ND METATARSAL;  Surgeon: [name], MD;  Location: [facility];  Service: Orthopedics;  Laterality: Right;   XI ROBOTIC ASSISTED VENTRAL HERNIA N/A 03/17/2022   Procedure: XI ROBOTIC ASSISTED VENTRAL HERNIA;  Surgeon: [name], MD;  Location: [facility];  Service: General;  Laterality: N/A;   Patient Active Problem List   Diagnosis Date Noted   Chronic hepatitis C (HCC) 03/09/2024   Malnutrition of moderate degree 01/06/2024   Spondylolisthesis at L4-L5 level 12/31/2023   Supine hypertension 12/03/2023   Parkinson's disease with neurogenic orthostatic hypotension (HCC) 12/03/2023   Preop cardiovascular exam 12/03/2023   Glaucoma 11/30/2023   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   Acquired hallux rigidus of right foot 11/23/2022   Ventral hernia without obstruction or gangrene    Primary open angle glaucoma of left eye, mild stage 06/23/2021   Facial numbness 12/24/2020   Neovascular glaucoma, right eye, stage unspecified 12/16/2020    Lumbar radiculopathy 11/25/2020   Gastroesophageal reflux disease 09/10/2020   Dry eyes, bilateral 08/15/2020   Presbycusis of right ear 01/17/2020   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   Secondary corneal edema of right eye 12/12/2019   Ptosis of right eyelid 12/12/2019   OAB (overactive bladder) 11/29/2019   Bunion of great toe of right foot 07/14/2017   Claw toe, acquired, right 07/14/2017   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   Medication management 10/09/2016   Plantar fasciitis of right foot 02/19/2015   Depression 08/14/2014   Family history of heart disease in male family member before age 94 04/26/2014   Mild cognitive impairment 03/13/2014   Parkinsonian tremor (HCC) 02/12/2014   Hyperlipidemia with target LDL less than 100    Abdominal aortic atherosclerosis (HCC)    Moderate essential hypertension 02/25/2011   Arthropathy 02/25/2011   HAV (hallux abducto valgus) 02/25/2011   ONSET DATE: 01/21/2024 (referral date)   REFERRING DIAG: M43.16 (ICD-10-CM) - Spondylolisthesis, lumbar  region  THERAPY DIAG:  Muscle weakness (generalized)  Parkinson's disease without dyskinesia, with fluctuating manifestations (HCC)  Other symptoms and signs involving the nervous system  Other lack of coordination  Visuospatial deficit  Attention and concentration deficit  Rationale for Evaluation and Treatment: Rehabilitation  SUBJECTIVE:   SUBJECTIVE STATEMENT: Pt reports he remembered to bring his binder with him today.   Pt accompanied by: self  PERTINENT HISTORY: recent back surgery (L3-5) on 12/31/23, Parkinson's disease causing autonomic dysregulation and orthostatic hypotension, leading to blood pressure fluctuations and dizziness upon standing. Lumbar radiculopathy (Chronic), PVD, HLD, spinal stenosis, BPH   Post-surgical recovery complicated by inappropriate medication management and inadequate  rehabilitation, with delirium post-anesthesia likely exacerbated by Parkinson's disease.    PRECAUTIONS: Other: can take off back brace when seated or in bed and some walking in house, but wear it mostly when up. Back precautions (no BLT), no lifting over 10 lbs, orthostatic hypotension   WEIGHT BEARING RESTRICTIONS: No  PAIN:  Are you having pain? Yes: NPRS scale: 1-3/10  Pain location: back pain Pain description: fluctuates Aggravating factors: staying in one position too long Relieving factors: changing positions, OTC Meds  FALLS: Has patient fallen in last 6 months? Yes. Number of falls 1  LIVING ENVIRONMENT: Lives with: lives with their spouse Lives in: 2 story home, but lives on first floor, 3 steps to enter Has following equipment at home: Vannie - 2 wheeled, shower chair, and Grab bars  PLOF: Needs assistance with ADLs prior to surgery, enjoys music and traveling  PATIENT GOALS: improve function and speed with ADLS including handwriting  OBJECTIVE:  Note: Objective measures were completed at Evaluation unless otherwise noted.  BP assessed and [facility] for therapy visit.   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  MOBILITY STATUS: Independent  POSTURE COMMENTS:  increased lumbar lordosis, increased thoracic kyphosis, and posterior pelvic tilt   FUNCTIONAL OUTCOME  MEASURES: Fastening/unfastening 3 buttons: 53 sec Physical performance test: PPT#2 (simulated eating) 27.21 & PPT#4 (donning/doffing jacket): 14.12 sec (w/ hospital gown)   COORDINATION: 9 Hole Peg test: Right: 31.60 sec; Left: 41.77 sec Box and Blocks:  Right: 37 blocks; Left: 37 blocks  UE ROM:  WFL   SENSATION: Fingertips get cold   COGNITION: Overall cognitive status: Impaired and decreased memory, processing speed  VISION:  Pt/family reports can only see light and images from Rt eye (? Legally blind), glaucoma and decreased vision Lt eye  OBSERVATIONS: Bradykinesia, Hypokinesia, and decline in cognition and overall function since back surgery  TREATMENT:   OT educated pt on buttoning and jacket donning and doffing as noted in pt instructions. Pt practiced buttoning using method following education from therapist. Handouts placed in therapy binder.  PATIENT EDUCATION: Education details: dressing; fine motor coordination; binder organization Person educated: Patient Education method: Explanation, Demonstration, Verbal cues, and Handouts Education comprehension: verbalized understanding, returned demonstration, verbal cues required, and needs further education   HOME EXERCISE PROGRAM: 02/17/2024: handwriting changes 02/24/2024: new behind head bag exercise handout 03/07/2024: Exercise Chart 03/14/2024: golf solitaire; coordination HEP 03/16/2024: Buttoning and jacket strategies  GOALS: Goals reviewed with patient? Yes  SHORT TERM GOALS: Target date: 03/10/24  Independent with PD specific HEP (Will need to adapt d/t current back precautions, consider adding bag ex's for ADLS) Baseline: Goal status: INITIAL  2.  Pt to write name with 90% or greater legibility with only min micrographia Baseline:  Goal status: IN PROGRESS  3. Pt will demonstrate improved ease  with feeding as evidenced by decreasing PPT#2 by 8 secs or more Baseline: 27.21 sec Goal status: INITIAL  4.  Pt will demonstrate improved ease with fastening buttons as evidenced by decreasing 3 button/unbutton time by 5 seconds or more Baseline: 53 sec Goal status: INITIAL  5.  Pt will be able to place at least 3 additional blocks bilaterally with completion of Box and Blocks test. Baseline: Right: 37 blocks; Left: 37 blocks Goal status: INITIAL   LONG TERM GOALS: Target date: 04/10/24  Pt will verbalize understanding of ways to prevent future PD related complications and appropriate community resources prn  Baseline:  Goal status: INITIAL  2.  Pt will verbalize understanding of ways to keep thinking skills sharp and ways to compensate for STM changes in the future  Baseline:  Goal status: INITIAL  3.  Pt will verbalize understanding of adaptive strategies to increase ease with ADLS/IADLS   Baseline:  Goal status: INITIAL  4.  Pt will write a sentence with no significant decrease in size and maintain 90% legibility  Baseline:  Goal status: INITIAL  5.  Pt will demonstrate improved fine motor coordination for ADLs as evidenced by decreasing 9 hole peg test score for Lt hand by 6 secs  Baseline: 41.77 sec.  Goal status: INITIAL  6.  Pt will demonstrate improved fine motor coordination for ADLs as evidenced by decreasing 9 hole peg test score for Rt hand by 3 secs  Baseline: 31.60 sec Goal status: INITIAL  ASSESSMENT:  CLINICAL IMPRESSION: Patient demonstrates good understanding of ADL strategies though will require additional education and practice to promote carryover. Pt seems to be becoming more antiquated with therapy binder and recommended HEPs as needed to manage PD symptoms.   PERFORMANCE DEFICITS: in functional skills including ADLs, IADLs, coordination, dexterity, proprioception, sensation, ROM, strength, pain, Fine motor control, Gross motor control, hearing,  mobility, balance, body mechanics, endurance, decreased knowledge of use of DME, vision, and UE functional use, cognitive skills including memory and safety awareness, and psychosocial skills including coping strategies.   IMPAIRMENTS: are limiting patient from ADLs, IADLs, rest and sleep, leisure, and social participation.   COMORBIDITIES:  has co-morbidities such as recent back surgery with back precautions, orthostatic hypotension, fluctuating BP that affects occupational performance. Patient will benefit from skilled OT to address above impairments and improve overall function.  REHAB POTENTIAL: Good  PLAN:  OT FREQUENCY: 2x/week  OT DURATION: 8 weeks  PLANNED INTERVENTIONS: 97535 self care/ADL training, 02889 therapeutic exercise, 97530 therapeutic activity, 97112 neuromuscular re-education, 97140 manual therapy,  02886 aquatic therapy, 97035 ultrasound, 02989 moist heat, 97129 Cognitive training (first 15 min), 97130 Cognitive training(each additional 15 min), 97750 Physical Performance Testing, passive range of motion, functional mobility training, visual/perceptual remediation/compensation, energy conservation, coping strategies training, patient/family education, and DME and/or AE instructions  RECOMMENDED OTHER SERVICES: will request speech therapy referral but will wait to make speech evaluation until after pt gets new hearing aids  CONSULTED AND AGREED WITH PLAN OF CARE: Patient and family member/caregiver  PLAN FOR NEXT SESSION: monitor BP (fluctuates), Jacket and buttoning ADLS; Bag exs for dressing (new handout with pictures?); vision options for phone - Permelia Bamba - did he practice with new mag? (Does not prefer [employer]); TREMOR Reduction strategies handout   Jocelyn CHRISTELLA Bottom, OT 03/16/2024, 2:04 PM

## 2024-03-18 ENCOUNTER — Encounter: Payer: Self-pay | Admitting: Adult Health

## 2024-03-20 ENCOUNTER — Encounter: Payer: Self-pay | Admitting: Physical Therapy

## 2024-03-20 ENCOUNTER — Other Ambulatory Visit: Payer: Self-pay

## 2024-03-20 ENCOUNTER — Ambulatory Visit: Admitting: Physical Therapy

## 2024-03-20 ENCOUNTER — Encounter: Payer: Self-pay | Admitting: Occupational Therapy

## 2024-03-20 ENCOUNTER — Ambulatory Visit: Admitting: Occupational Therapy

## 2024-03-20 VITALS — BP 110/66 | HR 60

## 2024-03-20 DIAGNOSIS — G20A2 Parkinson's disease without dyskinesia, with fluctuations: Secondary | ICD-10-CM | POA: Diagnosis not present

## 2024-03-20 DIAGNOSIS — R29818 Other symptoms and signs involving the nervous system: Secondary | ICD-10-CM

## 2024-03-20 DIAGNOSIS — R41840 Attention and concentration deficit: Secondary | ICD-10-CM

## 2024-03-20 DIAGNOSIS — R2689 Other abnormalities of gait and mobility: Secondary | ICD-10-CM | POA: Diagnosis not present

## 2024-03-20 DIAGNOSIS — M6281 Muscle weakness (generalized): Secondary | ICD-10-CM | POA: Diagnosis not present

## 2024-03-20 DIAGNOSIS — R278 Other lack of coordination: Secondary | ICD-10-CM | POA: Diagnosis not present

## 2024-03-20 DIAGNOSIS — R41842 Visuospatial deficit: Secondary | ICD-10-CM

## 2024-03-20 DIAGNOSIS — R2681 Unsteadiness on feet: Secondary | ICD-10-CM

## 2024-03-20 NOTE — Patient Instructions (Signed)
   Compensation Strategies for Tremors  When eating, try the following: . Eat out of bowls, divided plates, or use a plate guard (available at a medical supply store) and eat with a spoon so that you have an edge to scoop up food. . Try raising your plate so that there is less distance between the plate and mouth. . Try stabilizing elbows on the table or against your body. . Use utensil with built-up/larger grips as they are easier to hold.  When writing, try the following:   . Stabilize forearm on the table . Take your time as rushing/being stressed can increase tremors. . Try a felt-tipped pen, it does not glide as much.  Avoid gel pens (they move too much). . Consider using pre-printed labels with your name and address (carry them with you when you go out) or you can get stamps with your address or signature on it. . Use a small tape recorder to record messages/reminders for yourself. . Use pens with bigger grips.  When brushing your teeth, putting on make-up, or styling hair, try the following: . Use an electric toothbrush. . Use items with built-up grips. . Stabilize your elbows against your body or on the counter. . Use long-handled brushes/combs. . Use a hair dryer with a stand.  In general: . Avoid stress, fatigue, or rushing as this can increase tremors. . Sit down for activities that require more control/coordination.

## 2024-03-20 NOTE — Therapy (Signed)
 OUTPATIENT OCCUPATIONAL THERAPY PARKINSON'S TREATMENT  Patient Name: [name] MRN: [id number] DOB:06-26-1951, 73 y.o., male Today's Date: 03/20/2024  PCP: [name], MD REFERRING PROVIDER: Caleen Dirks, MD  END OF SESSION:  OT End of Session - 03/20/24 1319     Visit Number 8    Number of Visits 16    Date for OT Re-Evaluation 04/10/24    Authorization Type [employer] 2025 - Auth required    OT Start Time 1317    OT Stop Time 1405    OT Time Calculation (min) 48 min    Activity Tolerance Patient tolerated treatment well    Behavior During Therapy WFL for tasks assessed/performed         Past Medical History:  Diagnosis Date   Allergy    seasonal   Anxiety    Arthritis    BPH (benign prostatic hyperplasia)    Complication of anesthesia    Pt. stated he had a reaction that ended in him requiring urinary cath placement; hx delirium worsening parkinson symptoms   Depression    Diverticulosis    GERD (gastroesophageal reflux disease)    esophageal spasms   Glaucoma    Gout    Head injury, closed, with concussion    Hepatitis C    chronic - Has been treated with Harvoni   HLD (hyperlipidemia)    statin intolerant (Crestor  & Simvastatin ) - Taking Livalo  1mg  / week   Hypertension    Neuromuscular disorder (HCC)    Parkinson's Disease   Parkinson's disease (HCC)    Plantar fasciitis    right   PVD (peripheral vascular disease) (HCC)    With no claudication; only mild abdominal aortic atherosclerosis noted on ultrasound.   Sleep apnea    Wears CPAP nightly   Spinal stenosis of lumbar region    Thoracic ascending aortic aneurysm (HCC)    4.2 cm ascending TAA 09/2016 CT, 1 yr f/u rec   Ulcer    Past Surgical History:  Procedure Laterality Date   ANTERIOR LAT LUMBAR FUSION N/A 12/31/2023   Procedure: Prone trans-psoas interbody fusion - Lumbar three-Lumbar four - Lumbar four-Lumbar five, right facetectomy Lumbar four-five;  Surgeon: [name], MD;   Location: [facility];  Service: Neurosurgery;  Laterality: N/A;   BUNIONECTOMY WITH WEIL OSTEOTOMY Right 11/02/2019   Procedure: Right Foot Lapidus, Modified McBride Bunionectomy,  Hallux Akin Osteotomy;  Surgeon: [name], MD;  Location: [facility];  Service: Orthopedics;  Laterality: Right;   CARDIAC CATHETERIZATION  2005   30% Cx. Dr. Lavon   cataract surgery Left 11/05/2014   COLONOSCOPY     COLONOSCOPY N/A 01/09/2021   Procedure: COLONOSCOPY;  Surgeon: [name], MD;  Location: [facility];  Service: Gastroenterology;  Laterality: N/A;   ESOPHAGOGASTRODUODENOSCOPY N/A 05/02/2015   Procedure: ESOPHAGOGASTRODUODENOSCOPY (EGD);  Surgeon: [name], MD;  Location: [facility];  Service: Endoscopy;  Laterality: N/A;   ESOPHAGOGASTRODUODENOSCOPY  05/02/2015   no source of pt chest pain endoscopically evident. small hiatal hernia.   ESOPHAGOGASTRODUODENOSCOPY (EGD) WITH PROPOFOL  N/A 01/02/2021   Procedure: ESOPHAGOGASTRODUODENOSCOPY (EGD) WITH PROPOFOL ;  Surgeon: [name], MD;  Location: [facility];  Service: Endoscopy;  Laterality: N/A;   HARDWARE REMOVAL Right 11/02/2019   Procedure: Second Metatarsal Removal of Deep Implant and Rotational Osteotomy;  Surgeon: [name], MD;  Location: [facility];  Service: Orthopedics;  Laterality: Right;   IR ANGIOGRAM SELECTIVE EACH ADDITIONAL VESSEL  01/04/2021   IR ANGIOGRAM SELECTIVE EACH  ADDITIONAL VESSEL  01/04/2021   IR ANGIOGRAM VISCERAL SELECTIVE  01/04/2021   IR US  GUIDE VASC ACCESS RIGHT  01/04/2021   LUMBAR LAMINECTOMY/DECOMPRESSION MICRODISCECTOMY N/A 07/03/2021   Procedure: Lumbar three through five decompression with lumbar three through five insitu fusion;  Surgeon: [name], MD;  Location: [facility] OR;  Service: Orthopedics;  Laterality: N/A;   LUMBAR PERCUTANEOUS PEDICLE SCREW 2 LEVEL N/A 12/31/2023   Procedure: LUMBAR PERCUTANEOUS PEDICLE SCREW LUMBAR THREE-LUMBAR FIVE;  Surgeon: [name], MD;  Location: [facility];  Service: Neurosurgery;  Laterality: N/A;   MEMBRANE PEEL Left 03/14/2014   Procedure: MEMBRANE PEEL; ENDOLASER;  Surgeon: [name], MD;  Location: [facility];  Service: Ophthalmology;  Laterality: Left;   NM MYOVIEW  LTD  10/2015   LOW RISK. Small, fixed basal lateral defect - likely diaphragmatic attenuation. EF 69%   PARS PLANA VITRECTOMY Left 03/14/2014   Procedure: PARS PLANA VITRECTOMY WITH 25 GAUGE;  Surgeon: [name], MD;  Location: [facility] OR;  Service: Ophthalmology;  Laterality: Left;   shave  02/03/2022   angiofibroma   TONSILLECTOMY     TRANSTHORACIC ECHOCARDIOGRAM  01/07/2021   Normal EF 60 to 65%.  No R WMA.  GRII DD-moderately dilated LA..  Mildly dilated RV but normal function.  Normal RAP/CVP.  Trivial AI with mild to moderate sclerosis-no stenosis   UMBILICAL HERNIA REPAIR  2023   UPPER GASTROINTESTINAL ENDOSCOPY     WEIL OSTEOTOMY Right 09/01/2017   Procedure: RIGHT GREAT TOE CHEVRON AND WEIL OSTEOTOMY 2ND METATARSAL;  Surgeon: [name], MD;  Location: [facility];  Service: Orthopedics;  Laterality: Right;   XI ROBOTIC ASSISTED VENTRAL HERNIA N/A 03/17/2022   Procedure: XI ROBOTIC ASSISTED VENTRAL HERNIA;  Surgeon: [name], MD;  Location: [facility];  Service: General;  Laterality: N/A;   Patient Active Problem List   Diagnosis Date Noted   Chronic hepatitis C (HCC) 03/09/2024   Malnutrition of moderate degree 01/06/2024   Spondylolisthesis at L4-L5 level 12/31/2023   Supine hypertension 12/03/2023   Parkinson's disease with neurogenic orthostatic hypotension (HCC) 12/03/2023   Preop cardiovascular exam 12/03/2023   Glaucoma 11/30/2023   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   Acquired hallux rigidus of right foot 11/23/2022   Ventral hernia without obstruction or gangrene    Primary open angle glaucoma of left eye, mild stage 06/23/2021   Facial numbness 12/24/2020   Neovascular glaucoma, right eye, stage unspecified 12/16/2020    Lumbar radiculopathy 11/25/2020   Gastroesophageal reflux disease 09/10/2020   Dry eyes, bilateral 08/15/2020   Presbycusis of right ear 01/17/2020   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   Secondary corneal edema of right eye 12/12/2019   Ptosis of right eyelid 12/12/2019   OAB (overactive bladder) 11/29/2019   Bunion of great toe of right foot 07/14/2017   Claw toe, acquired, right 07/14/2017   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   Medication management 10/09/2016   Plantar fasciitis of right foot 02/19/2015   Depression 08/14/2014   Family history of heart disease in male family member before age 43 04/26/2014   Mild cognitive impairment 03/13/2014   Parkinsonian tremor (HCC) 02/12/2014   Hyperlipidemia with target LDL less than 100    Abdominal aortic atherosclerosis (HCC)    Moderate essential hypertension 02/25/2011   Arthropathy 02/25/2011   HAV (hallux abducto valgus) 02/25/2011   ONSET DATE: 01/21/2024 (referral date)   REFERRING DIAG: M43.16 (ICD-10-CM) - Spondylolisthesis, lumbar  region  THERAPY DIAG:  Other symptoms and signs involving the nervous system  Muscle weakness (generalized)  Other lack of coordination  Visuospatial deficit  Attention and concentration deficit  Rationale for Evaluation and Treatment: Rehabilitation  SUBJECTIVE:   SUBJECTIVE STATEMENT: Pain lower back 3/10.   Pt accompanied by: self  PERTINENT HISTORY: recent back surgery (L3-5) on 12/31/23, Parkinson's disease causing autonomic dysregulation and orthostatic hypotension, leading to blood pressure fluctuations and dizziness upon standing. Lumbar radiculopathy (Chronic), PVD, HLD, spinal stenosis, BPH   Post-surgical recovery complicated by inappropriate medication management and inadequate rehabilitation, with delirium post-anesthesia likely exacerbated by Parkinson's disease.    PRECAUTIONS: Other: can  take off back brace when seated or in bed and some walking in house, but wear it mostly when up. Back precautions (no BLT), no lifting over 10 lbs, orthostatic hypotension   WEIGHT BEARING RESTRICTIONS: No  PAIN:  Are you having pain? Yes: NPRS scale: 1-3/10  Pain location: back pain Pain description: fluctuates Aggravating factors: staying in one position too long Relieving factors: changing positions, OTC Meds  FALLS: Has patient fallen in last 6 months? Yes. Number of falls 1  LIVING ENVIRONMENT: Lives with: lives with their spouse Lives in: 2 story home, but lives on first floor, 3 steps to enter Has following equipment at home: Vannie - 2 wheeled, shower chair, and Grab bars  PLOF: Needs assistance with ADLs prior to surgery, enjoys music and traveling  PATIENT GOALS: improve function and speed with ADLS including handwriting  OBJECTIVE:  Note: Objective measures were completed at Evaluation unless otherwise noted.  BP assessed and [facility] for therapy visit.   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  MOBILITY STATUS: Independent  POSTURE COMMENTS:  increased lumbar lordosis, increased thoracic kyphosis, and posterior pelvic tilt   FUNCTIONAL OUTCOME MEASURES: Fastening/unfastening 3 buttons: 53 sec Physical performance test: PPT#2 (simulated eating) 27.21 & PPT#4 (donning/doffing jacket):  14.12 sec (w/ hospital gown)   COORDINATION: 9 Hole Peg test: Right: 31.60 sec; Left: 41.77 sec Box and Blocks:  Right: 37 blocks; Left: 37 blocks  UE ROM:  WFL   SENSATION: Fingertips get cold   COGNITION: Overall cognitive status: Impaired and decreased memory, processing speed  VISION:  Pt/family reports can only see light and images from Rt eye (? Legally blind), glaucoma and decreased vision Lt eye  OBSERVATIONS: Bradykinesia, Hypokinesia, and decline in cognition and overall function since back surgery  TREATMENT:   Reviewed handwriting strategies - pt practiced writing name and short sentence w/ cues to slow down and write big. Pt able to maintain size and legibility at 90% Practiced jacket - reviewed cape method. Pt did very well with this.  Practiced buttons - pt did much better with shirt ON vs. Off at table. However, this is partially d/t vision. Pt could feel it better when on. Assessed button goal with shirt on Pt issued tremor strategies - however briefly reviewed as tremors tend to be resting instead of action tremors.  Practiced cutting food and opening twist lid containers w/ focus on big movement PATIENT EDUCATION: Education details: dressing; fine motor coordination; binder organization Person educated: Patient Education method: Explanation, Demonstration, Verbal cues, and Handouts Education comprehension: verbalized understanding, returned demonstration, verbal cues required, and needs further education   HOME EXERCISE PROGRAM: 02/17/2024: handwriting changes 02/24/2024: new behind head bag exercise handout 03/07/2024: Exercise Chart 03/14/2024: golf solitaire; coordination HEP 03/16/2024: Buttoning and jacket strategies 03/20/24: tremor strategies  GOALS: Goals reviewed with patient? Yes  SHORT TERM GOALS: Target date: 03/10/24  Independent  with PD specific HEP (Will need to adapt d/t current back precautions, consider adding bag ex's for ADLS) Baseline: Goal status: IN PROGRESS  2.  Pt to write name with 90% or greater legibility with only min micrographia Baseline:  Goal status: MET   3. Pt will demonstrate improved ease with feeding as evidenced by decreasing PPT#2 by 8 secs or more Baseline: 27.21 sec Goal status: MET (03/20/24: 16 sec. Using white bowl d/t visual deficits)  4.  Pt will demonstrate improved ease with fastening buttons as evidenced by decreasing 3 button/unbutton time by 5 seconds or more Baseline: 53 sec Goal status: MET (03/20/24 = 32.46 sec w/ shirt ON d/t decreased vision)   5.  Pt will be able to place at least 3 additional blocks bilaterally with completion of Box and Blocks test. Baseline: Right: 37 blocks; Left: 37 blocks Goal status: INITIAL   LONG TERM GOALS: Target date: 04/10/24  Pt will verbalize understanding of ways to prevent future PD related complications and appropriate community resources prn  Baseline:  Goal status: INITIAL  2.  Pt will verbalize understanding of ways to keep thinking skills sharp and ways to compensate for STM changes in the future  Baseline:  Goal status: INITIAL  3.  Pt will verbalize understanding of adaptive strategies to increase ease with ADLS/IADLS   Baseline:  Goal status: INITIAL  4.  Pt will write a sentence with no significant decrease in size and maintain 90% legibility  Baseline:  Goal status: INITIAL  5.  Pt will demonstrate improved fine motor coordination for ADLs as evidenced by decreasing 9 hole peg test score for Lt hand by 6 secs  Baseline: 41.77 sec.  Goal status: INITIAL  6.  Pt will demonstrate improved fine motor coordination for ADLs as evidenced by decreasing 9 hole peg test score for Rt hand by 3 secs  Baseline: 31.60 sec Goal status: INITIAL  ASSESSMENT:  CLINICAL IMPRESSION: Patient demonstrates good understanding of  ADL strategies though will require additional education and practice to promote carryover. Pt seems to be becoming more antiquated with therapy binder and recommended HEPs and education as needed to manage PD symptoms.   PERFORMANCE DEFICITS: in functional skills including ADLs, IADLs, coordination, dexterity, proprioception, sensation, ROM, strength, pain, Fine motor control, Gross motor control, hearing, mobility, balance, body mechanics, endurance, decreased knowledge of use of DME, vision, and  UE functional use, cognitive skills including memory and safety awareness, and psychosocial skills including coping strategies.   IMPAIRMENTS: are limiting patient from ADLs, IADLs, rest and sleep, leisure, and social participation.   COMORBIDITIES:  has co-morbidities such as recent back surgery with back precautions, orthostatic hypotension, fluctuating BP that affects occupational performance. Patient will benefit from skilled OT to address above impairments and improve overall function.  REHAB POTENTIAL: Good  PLAN:  OT FREQUENCY: 2x/week  OT DURATION: 8 weeks  PLANNED INTERVENTIONS: 97535 self care/ADL training, 02889 therapeutic exercise, 97530 therapeutic activity, 97112 neuromuscular re-education, 97140 manual therapy, 97113 aquatic therapy, 97035 ultrasound, 97010 moist heat, 97129 Cognitive training (first 15 min), 02869 Cognitive training(each additional 15 min), 02249 Physical Performance Testing, passive range of motion, functional mobility training, visual/perceptual remediation/compensation, energy conservation, coping strategies training, patient/family education, and DME and/or AE instructions  RECOMMENDED OTHER SERVICES: will request speech therapy referral but will wait to make speech evaluation until after pt gets new hearing aids  CONSULTED AND AGREED WITH PLAN OF CARE: Patient and family member/caregiver  PLAN FOR NEXT SESSION: monitor BP (fluctuates), short term memory  strategies, ways to prevent future PD related complications, review Bag exs for dressing (new handout with pictures?);   vision options for phone - [employer] - did he practice with new mag? (Does not prefer [employer]);  Burnard JINNY Roads, OT 03/20/2024, 2:30 PM

## 2024-03-20 NOTE — Therapy (Signed)
 OUTPATIENT PHYSICAL THERAPY NEURO TREATMENT   Patient Name: [name] MRN: [id number] DOB:02-19-1951, 73 y.o., male Today's Date: 03/20/2024   PCP: [name], MD   REFERRING PROVIDER: Caleen Dirks, MD (sent to Harlene Bogaert, NP)  END OF SESSION:  PT End of Session - 03/20/24 1234     Visit Number 5    Number of Visits 13    Date for PT Re-Evaluation 04/08/24   due to potential delay in scheduling   Authorization Type [employer] Medicare    PT Start Time 1233    PT Stop Time 1313    PT Time Calculation (min) 40 min    Equipment Utilized During Treatment Gait belt    Activity Tolerance Patient tolerated treatment well    Behavior During Therapy WFL for tasks assessed/performed;Flat affect          Past Medical History:  Diagnosis Date   Allergy    seasonal   Anxiety    Arthritis    BPH (benign prostatic hyperplasia)    Complication of anesthesia    Pt. stated he had a reaction that ended in him requiring urinary cath placement; hx delirium worsening parkinson symptoms   Depression    Diverticulosis    GERD (gastroesophageal reflux disease)    esophageal spasms   Glaucoma    Gout    Head injury, closed, with concussion    Hepatitis C    chronic - Has been treated with Harvoni   HLD (hyperlipidemia)    statin intolerant (Crestor  & Simvastatin ) - Taking Livalo  1mg  / week   Hypertension    Neuromuscular disorder (HCC)    Parkinson's Disease   Parkinson's disease (HCC)    Plantar fasciitis    right   PVD (peripheral vascular disease) (HCC)    With no claudication; only mild abdominal aortic atherosclerosis noted on ultrasound.   Sleep apnea    Wears CPAP nightly   Spinal stenosis of lumbar region    Thoracic ascending aortic aneurysm (HCC)    4.2 cm ascending TAA 09/2016 CT, 1 yr f/u rec   Ulcer    Past Surgical History:  Procedure Laterality Date   ANTERIOR LAT LUMBAR FUSION N/A 12/31/2023   Procedure: Prone trans-psoas interbody fusion - Lumbar  three-Lumbar four - Lumbar four-Lumbar five, right facetectomy Lumbar four-five;  Surgeon: [name], MD;  Location: [facility];  Service: Neurosurgery;  Laterality: N/A;   BUNIONECTOMY WITH WEIL OSTEOTOMY Right 11/02/2019   Procedure: Right Foot Lapidus, Modified McBride Bunionectomy,  Hallux Akin Osteotomy;  Surgeon: [name], MD;  Location: [facility];  Service: Orthopedics;  Laterality: Right;   CARDIAC CATHETERIZATION  2005   30% Cx. Dr. Lavon   cataract surgery Left 11/05/2014   COLONOSCOPY     COLONOSCOPY N/A 01/09/2021   Procedure: COLONOSCOPY;  Surgeon: [name], MD;  Location: [facility];  Service: Gastroenterology;  Laterality: N/A;   ESOPHAGOGASTRODUODENOSCOPY N/A 05/02/2015   Procedure: ESOPHAGOGASTRODUODENOSCOPY (EGD);  Surgeon: [name], MD;  Location: [facility];  Service: Endoscopy;  Laterality: N/A;   ESOPHAGOGASTRODUODENOSCOPY  05/02/2015   no source of pt chest pain endoscopically evident. small hiatal hernia.   ESOPHAGOGASTRODUODENOSCOPY (EGD) WITH PROPOFOL  N/A 01/02/2021   Procedure: ESOPHAGOGASTRODUODENOSCOPY (EGD) WITH PROPOFOL ;  Surgeon: [name], MD;  Location: [facility];  Service: Endoscopy;  Laterality: N/A;   HARDWARE REMOVAL Right 11/02/2019   Procedure: Second Metatarsal Removal of Deep Implant and Rotational Osteotomy;  Surgeon: [name], MD;  Location: [facility];  Service: Orthopedics;  Laterality: Right;   IR ANGIOGRAM SELECTIVE EACH ADDITIONAL VESSEL  01/04/2021   IR ANGIOGRAM SELECTIVE EACH ADDITIONAL VESSEL  01/04/2021   IR ANGIOGRAM VISCERAL SELECTIVE  01/04/2021   IR US  GUIDE VASC ACCESS RIGHT  01/04/2021   LUMBAR LAMINECTOMY/DECOMPRESSION MICRODISCECTOMY N/A 07/03/2021   Procedure: Lumbar three through five decompression with lumbar three through five insitu fusion;  Surgeon: [name], MD;  Location: [facility];  Service: Orthopedics;  Laterality: N/A;   LUMBAR PERCUTANEOUS PEDICLE SCREW 2  LEVEL N/A 12/31/2023   Procedure: LUMBAR PERCUTANEOUS PEDICLE SCREW LUMBAR THREE-LUMBAR FIVE;  Surgeon: [name], MD;  Location: [facility];  Service: Neurosurgery;  Laterality: N/A;   MEMBRANE PEEL Left 03/14/2014   Procedure: MEMBRANE PEEL; ENDOLASER;  Surgeon: [name], MD;  Location: [facility];  Service: Ophthalmology;  Laterality: Left;   NM MYOVIEW  LTD  10/2015   LOW RISK. Small, fixed basal lateral defect - likely diaphragmatic attenuation. EF 69%   PARS PLANA VITRECTOMY Left 03/14/2014   Procedure: PARS PLANA VITRECTOMY WITH 25 GAUGE;  Surgeon: [name], MD;  Location: [facility] OR;  Service: Ophthalmology;  Laterality: Left;   shave  02/03/2022   angiofibroma   TONSILLECTOMY     TRANSTHORACIC ECHOCARDIOGRAM  01/07/2021   Normal EF 60 to 65%.  No R WMA.  GRII DD-moderately dilated LA..  Mildly dilated RV but normal function.  Normal RAP/CVP.  Trivial AI with mild to moderate sclerosis-no stenosis   UMBILICAL HERNIA REPAIR  2023   UPPER GASTROINTESTINAL ENDOSCOPY     WEIL OSTEOTOMY Right 09/01/2017   Procedure: RIGHT GREAT TOE CHEVRON AND WEIL OSTEOTOMY 2ND METATARSAL;  Surgeon: [name], MD;  Location: [facility];  Service: Orthopedics;  Laterality: Right;   XI ROBOTIC ASSISTED VENTRAL HERNIA N/A 03/17/2022   Procedure: XI ROBOTIC ASSISTED VENTRAL HERNIA;  Surgeon: [name], MD;  Location: [facility];  Service: General;  Laterality: N/A;   Patient Active Problem List   Diagnosis Date Noted   Chronic hepatitis C (HCC) 03/09/2024   Malnutrition of moderate degree 01/06/2024   Spondylolisthesis at L4-L5 level 12/31/2023   Supine hypertension 12/03/2023   Parkinson's disease with neurogenic orthostatic hypotension (HCC) 12/03/2023   Preop cardiovascular exam 12/03/2023   Glaucoma 11/30/2023   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   Acquired hallux rigidus of right foot 11/23/2022   Ventral hernia without obstruction or gangrene    Primary open angle glaucoma of left eye, mild  stage 06/23/2021   Facial numbness 12/24/2020   Neovascular glaucoma, right eye, stage unspecified 12/16/2020   Lumbar radiculopathy 11/25/2020   Gastroesophageal reflux disease 09/10/2020   Dry eyes, bilateral 08/15/2020   Presbycusis of right ear 01/17/2020   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   Secondary corneal edema of right eye 12/12/2019   Ptosis of right eyelid 12/12/2019   OAB (overactive bladder) 11/29/2019   Bunion of great toe of right foot 07/14/2017   Claw toe, acquired, right 07/14/2017   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   Medication management 10/09/2016   Plantar fasciitis of right foot 02/19/2015   Depression 08/14/2014   Family history of heart disease in male family member before age 87 04/26/2014   Mild cognitive impairment 03/13/2014   Parkinsonian tremor (HCC) 02/12/2014   Hyperlipidemia with target LDL less than 100    Abdominal aortic atherosclerosis (HCC)    Moderate essential hypertension 02/25/2011   Arthropathy 02/25/2011  HAV (hallux abducto valgus) 02/25/2011    ONSET DATE: 01/19/2024  REFERRING DIAG: M43.16 (ICD-10-CM) - Spondylolisthesis, lumbar region  THERAPY DIAG:  Muscle weakness (generalized)  Other symptoms and signs involving the nervous system  Other abnormalities of gait and mobility  Unsteadiness on feet  Rationale for Evaluation and Treatment: Rehabilitation  SUBJECTIVE:                                                                                                                                                                                             SUBJECTIVE STATEMENT: No falls, exercises are going well. Had a little discomfort this morning,so put his brace on before coming in to therapy. Wants to work on some balance.   Pt accompanied by: self  PERTINENT HISTORY: PMH: s/p L3-5 prone trans-psoas approach for lateral interbody fusion;  R L4-5 facetectomy and posterior segmental instrumentation L3-5 (12/2023). PMH significant for PD (complicated by tremor, gait impairment, REM sleep behavior and cognitive impairment ), HTN, DM, gout, glaucoma, PVD   Per note from [facility]: He underwent back surgery on 6/6 (L2-5 lateral interbody fusion with posterior instrumentation) with postop confusion and delirium requiring restraints,  improved with use of Seroquel  and discontinuing opioids and muscle relaxants, suspected confusion due to GA, PD and possibly medication side effects.  He was discharged on Seroquel  25/50.  He was discharged to [facility] rehab on 6/18, has not yet started therapies. He continues to have occasional confusion although some what improving per wife, at times with paranoia and visual hallucinations, also has been wandering and currently wearing locator band on ankle   PAIN:  Are you having pain? No and a little soreness from doing the exercises   Vitals:   03/20/24 1238 03/20/24 1239  BP: 125/75 110/66  Pulse: (!) 58 60        PRECAUTIONS: Back, Fall, and Other: hx of orthostatics, pt's spouse has to remind him of his precautions    FALLS: Has patient fallen in last 6 months? Yes. Number of falls 1, had a fall getting up and fell back against the window, this happened about a week or 2 before the surgery   LIVING ENVIRONMENT: Lives with: lives with their spouse Lives in: House/apartment Stairs: Yes: Internal: 15 steps; on right going up and External: 2 on the front porch and 3 on the side steps; can reach both, doesn't have to go up indoor steps, can live on the bottom floor  Has following equipment at home: Vannie - 2 wheeled, shower chair, and Grab bars  PLOF: Independent and Leisure: music, traveling Pt's spouse has to remind him of  brace precautions for ADLs   PATIENT GOALS: Wants to be able to be more independent, stronger, and more mobile   OBJECTIVE:  Note: Objective measures were  completed at Evaluation unless otherwise noted.  COGNITION: Overall cognitive status: Impaired   SENSATION: Light touch: WFL Reports numbness/tingling intermittently down front of BLE   COORDINATION: Heel to shin: slightly harder with LLE   POSTURE: rounded shoulders and forward head   LOWER EXTREMITY MMT:    MMT Right Eval Left Eval  Hip flexion 5 4+  Hip extension    Hip abduction 4 4  Hip adduction 4+ 4+  Hip internal rotation    Hip external rotation    Knee flexion 4+ 4+  Knee extension 4 -4  Ankle dorsiflexion 5 5  Ankle plantarflexion    Ankle inversion    Ankle eversion    (Blank rows = not tested)  All tested in sitting   BED MOBILITY:  Pt reports will feel stuck trying to get his legs in and out of the bed. Reviewed the log roll technique for bed mobility (PT demonstrating on the bed)  TRANSFERS: Sit to stand: SBA  Assistive device utilized: None     Stand to sit: SBA  Assistive device utilized: None      Mainly performs with UE support, but can perform without, with intermittent retropulsion  GAIT: Findings: Gait Characteristics: step through pattern, decreased arm swing- Right, decreased arm swing- Left, decreased stride length, decreased ankle dorsiflexion- Right, decreased ankle dorsiflexion- Left, Right foot flat, Left foot flat, shuffling, decreased trunk rotation, and narrow BOS, Distance walked: Clinic distances , Assistive device utilized:None, Level of assistance: SBA and CGA, and Comments: intermittent CGA during turns due to pt with narrow BOS   FUNCTIONAL TESTS:  5 times sit to stand: 21.4 seconds with no UE support, last rep pt with an episode of retropulsion  10 meter walk test: 11.5 seconds with no AD = 2.85 ft/sec     [facility] PT Assessment - 02/08/24 1440       Timed Up and Go Test   Normal TUG (seconds) 11.8    Manual TUG (seconds) 12.8    Cognitive TUG (seconds) 22   attempting retro counting by 3s, pt tends to add instead of  subtract   TUG Comments pt with narrow BOS when turning with intermittent CGA                                                                                                                                      TREATMENT DATE: 03/14/24   Therapeutic Activity:  Vitals:   03/20/24 1238 03/20/24 1239  BP: 125/75 110/66  Pulse: (!) 58 60  Sitting, Standing  Pt reports feeling a little lightheaded. Pt wearing compression socks. Pt reporting drinking water  before he came in and BP still WNL to participate in PT. Pt reporting no lightheadedness during session  Goal Assessment: 5x sit <> stand: 14.6 seconds with no UE support Gait speed: 10.9 seconds with no AD = 3.0 ft/sec  Discussed adding more appts as pt only had 3 more left and pt had to miss a couple, pt unsure if he wants to add more at this time and wants to wait to see how he is doing then and decide at that time.   NMR:  With 6 blaze pods (3 on first step, 3 on 2nd step) to work on weight shifting, SLS, foot clearance, reaction times. Performed on air ex for compliant surface, trying to perform with no UE support, cues to alternate between feet when tapping  Performed 3 bouts of 1 minute each: 13 hits, 19 hits, 26 hits  Pt needing intermittent UE support, CGA for balance  Pt reporting RPE as 5/10  In // bars: On blue foam beam: Side stepping down and back x2 reps with focus on incr step height and step length, added ball toss with PT standing in front of pt for an additional down and back x3 reps for manual dual tasking, pt losing balance intermittently posteriorly and needing to use bar for balance  Alternating forward stepping x15 reps each side, needs cues for sequencing and making sure that one foot stays on the foam beam at all times, and for incr foot clearance, pt reporting initially performing with his EC (unsure why as this was not instructed), and PT needing to provide reminder cues throughout to keep eyes open   Posterior stepping strategy x15 reps each side, added cognitive challenge with trying to name foods in alphabetical order, pt very challenged by this and tendency to start taking a step forwards instead of backwards or getting some of the alphabet mixed up    PATIENT EDUCATION: Education details: results of goals, continue HEP  Person educated: Patient Education method: Explanation, Demonstration, and Verbal cues Education comprehension: verbalized understanding and returned demonstration   HOME EXERCISE PROGRAM: Standing PWR Moves  Access Code: [email or web address] URL: [email or web address] Date: 03/07/2024 Prepared by: Sheffield Senate  Exercises - Sit to Stand  - 1-2 x daily - 4-5 x weekly - 2 sets - 10 reps  GOALS: Goals reviewed with patient? Yes  SHORT TERM GOALS: Target date: 02/29/2024  Pt will be independent with initial HEP for improved strength, balance, transfers and gait. Baseline: Goal status: INITIAL  2.  miniBEST to be assessed with LTG written.  Baseline: 20/28 Goal status: MET  3.  Pt will improve 5x sit<>stand to less than or equal to 18 sec to demonstrate improved functional strength and transfer efficiency.   Baseline: 21.4 seconds with no UE support, last rep pt with an episode of retropulsion   15.4 seconds with no UE support  Goal status: MET    LONG TERM GOALS: Target date: 03/21/2024 Updated goal date to reflect POC/last appt date: 03/30/24  Pt will verbalize understanding of local PD community resources, including fitness post DC.  Baseline:  Goal status: INITIAL  2.  Pt will improve miniBEST to at least a 23/28 in order to demo decr fall risk.  Baseline: 20/28 Goal status: INITIAL  3.  Pt will perform cog TUG in 18 seconds or less in order to demo improved dual tasking.  Baseline: 22 seconds Goal status: INITIAL  4.  Pt will improve gait speed with no AD vs. LRAD to at least 3.1 ft/sec in order to demo improved community mobility.    Baseline: 11.5 seconds with no AD =  2.85 ft/sec  10.9 seconds with no AD = 3.0 ft/sec 8/25) Goal status: ON-GOING  5.  Pt will improve 5x sit<>stand to less than or equal to 16 sec to demonstrate improved functional strength and transfer efficiency.  Baseline: 21.4 seconds with no UE support, last rep pt with an episode of retropulsion   14.6 seconds with no UE support (8/25) Goal status: MET   ASSESSMENT:  CLINICAL IMPRESSION: Pt with a drop in BP from sitting > standing today (but BP still WNL, see above), with pt reporting some lightheadedness. Pt wearing compression socks and with activity during session, pt did not report any lightheadedness. Began to assess LTGs and updated LTG date to reflect POC/last scheduled appt as miniBEST was just assessed 2 visits ago due to pt having a delay in his scheduling for PT. Pt met LTG #5 in regards to sit <> stands - able to perform in 14.6 seconds with no UE support and no retropulsion (at eval was 21.4 seconds with an episode of retropulsion). Pt improved gait speed to 3.0 ft/sec (improved from 2.85 ft/sec), but not quite to goal level. Pt does need reminder cues during session for reciprocal arm swing during gait. Remainder of session focused on high level balance working on SLS and stepping strategies. Pt most challenged by adding in cog dual task when working on posterior stepping. Pt needing CGA on compliant surfaces for SLS tasks, but did improve with incr reps. Will continue per POC.     OBJECTIVE IMPAIRMENTS: Abnormal gait, decreased activity tolerance, decreased balance, decreased cognition, decreased coordination, decreased endurance, decreased mobility, difficulty walking, decreased strength, decreased safety awareness, hypomobility, impaired sensation, and postural dysfunction.   ACTIVITY LIMITATIONS: carrying, lifting, bending, stairs, transfers, and locomotion level  PARTICIPATION LIMITATIONS: driving, shopping, community activity,  and yard work  PERSONAL FACTORS: Age, Behavior pattern, Past/current experiences, Time since onset of injury/illness/exacerbation, and 3+ comorbidities: s/p L3-5 prone trans-psoas approach for lateral interbody fusion; R L4-5 facetectomy and posterior segmental instrumentation L3-5 (12/2023). PMH significant for PD (complicated by tremor, gait impairment, REM sleep behavior and cognitive impairment ), HTN, DM, gout, glaucoma, PVD, orthostatic hypotension  are also affecting patient's functional outcome.   REHAB POTENTIAL: Good  CLINICAL DECISION MAKING: Evolving/moderate complexity  EVALUATION COMPLEXITY: Moderate  PLAN:  PT FREQUENCY: 2x/week  PT DURATION: 8 weeks - anticipate just 6 weeks   PLANNED INTERVENTIONS: 97164- PT Re-evaluation, 97750- Physical Performance Testing, 97110-Therapeutic exercises, 97530- Therapeutic activity, 97112- Neuromuscular re-education, 97535- Self Care, [id number]- Manual therapy, [phone]- Gait training, Patient/Family education, Balance training, Stair training, and DME instructions  PLAN FOR NEXT SESSION: check BP!, work on wider BOS and turning tasks, stepping strategies, SLS, also add in core stability exercises after back surgery Cog dual tasking with balance tasks   Pt wanted to see how he is doing at last scheduled appt/remainder of his goal check to determine if he wanted to add more visits or not    Sheffield LOISE Senate, PT, DPT 03/20/2024, 1:52 PM

## 2024-03-22 ENCOUNTER — Ambulatory Visit: Admitting: Physical Therapy

## 2024-03-22 ENCOUNTER — Encounter: Payer: Self-pay | Admitting: Occupational Therapy

## 2024-03-22 ENCOUNTER — Ambulatory Visit: Admitting: Occupational Therapy

## 2024-03-22 VITALS — BP 131/88 | HR 59

## 2024-03-22 DIAGNOSIS — M6281 Muscle weakness (generalized): Secondary | ICD-10-CM

## 2024-03-22 DIAGNOSIS — R278 Other lack of coordination: Secondary | ICD-10-CM | POA: Diagnosis not present

## 2024-03-22 DIAGNOSIS — R41840 Attention and concentration deficit: Secondary | ICD-10-CM

## 2024-03-22 DIAGNOSIS — R2689 Other abnormalities of gait and mobility: Secondary | ICD-10-CM

## 2024-03-22 DIAGNOSIS — G20A2 Parkinson's disease without dyskinesia, with fluctuations: Secondary | ICD-10-CM | POA: Diagnosis not present

## 2024-03-22 DIAGNOSIS — R2681 Unsteadiness on feet: Secondary | ICD-10-CM | POA: Diagnosis not present

## 2024-03-22 DIAGNOSIS — R41842 Visuospatial deficit: Secondary | ICD-10-CM

## 2024-03-22 DIAGNOSIS — R29818 Other symptoms and signs involving the nervous system: Secondary | ICD-10-CM | POA: Diagnosis not present

## 2024-03-22 NOTE — Therapy (Signed)
 OUTPATIENT OCCUPATIONAL THERAPY PARKINSON'S TREATMENT  Patient Name: [name] MRN: [id number] DOB:December 29, 1950, 73 y.o., male Today's Date: 03/22/2024  PCP: [name], MD REFERRING PROVIDER: Caleen Dirks, MD  END OF SESSION:  OT End of Session - 03/22/24 1403     Visit Number 9    Number of Visits 16    Date for OT Re-Evaluation 04/10/24    Authorization Type [employer] 2025 - Auth required    OT Start Time 1402    OT Stop Time 1445    OT Time Calculation (min) 43 min    Activity Tolerance Patient tolerated treatment well    Behavior During Therapy WFL for tasks assessed/performed         Past Medical History:  Diagnosis Date   Allergy    seasonal   Anxiety    Arthritis    BPH (benign prostatic hyperplasia)    Complication of anesthesia    Pt. stated he had a reaction that ended in him requiring urinary cath placement; hx delirium worsening parkinson symptoms   Depression    Diverticulosis    GERD (gastroesophageal reflux disease)    esophageal spasms   Glaucoma    Gout    Head injury, closed, with concussion    Hepatitis C    chronic - Has been treated with Harvoni   HLD (hyperlipidemia)    statin intolerant (Crestor  & Simvastatin ) - Taking Livalo  1mg  / week   Hypertension    Neuromuscular disorder (HCC)    Parkinson's Disease   Parkinson's disease (HCC)    Plantar fasciitis    right   PVD (peripheral vascular disease) (HCC)    With no claudication; only mild abdominal aortic atherosclerosis noted on ultrasound.   Sleep apnea    Wears CPAP nightly   Spinal stenosis of lumbar region    Thoracic ascending aortic aneurysm (HCC)    4.2 cm ascending TAA 09/2016 CT, 1 yr f/u rec   Ulcer    Past Surgical History:  Procedure Laterality Date   ANTERIOR LAT LUMBAR FUSION N/A 12/31/2023   Procedure: Prone trans-psoas interbody fusion - Lumbar three-Lumbar four - Lumbar four-Lumbar five, right facetectomy Lumbar four-five;  Surgeon: [name], MD;   Location: [facility] OR;  Service: Neurosurgery;  Laterality: N/A;   BUNIONECTOMY WITH WEIL OSTEOTOMY Right 11/02/2019   Procedure: Right Foot Lapidus, Modified McBride Bunionectomy,  Hallux Akin Osteotomy;  Surgeon: [name], MD;  Location: [facility];  Service: Orthopedics;  Laterality: Right;   CARDIAC CATHETERIZATION  2005   30% Cx. Dr. Lavon   cataract surgery Left 11/05/2014   COLONOSCOPY     COLONOSCOPY N/A 01/09/2021   Procedure: COLONOSCOPY;  Surgeon: [name], MD;  Location: [facility];  Service: Gastroenterology;  Laterality: N/A;   ESOPHAGOGASTRODUODENOSCOPY N/A 05/02/2015   Procedure: ESOPHAGOGASTRODUODENOSCOPY (EGD);  Surgeon: [name], MD;  Location: [facility];  Service: Endoscopy;  Laterality: N/A;   ESOPHAGOGASTRODUODENOSCOPY  05/02/2015   no source of pt chest pain endoscopically evident. small hiatal hernia.   ESOPHAGOGASTRODUODENOSCOPY (EGD) WITH PROPOFOL  N/A 01/02/2021   Procedure: ESOPHAGOGASTRODUODENOSCOPY (EGD) WITH PROPOFOL ;  Surgeon: [name], MD;  Location: [facility];  Service: Endoscopy;  Laterality: N/A;   HARDWARE REMOVAL Right 11/02/2019   Procedure: Second Metatarsal Removal of Deep Implant and Rotational Osteotomy;  Surgeon: [name], MD;  Location:  S[facility]ERY [facility];  Service: Orthopedics;  Laterality: Right;   IR ANGIOGRAM SELECTIVE EACH ADDITIONAL VESSEL  01/04/2021   IR ANGIOGRAM SELECTIVE EACH  ADDITIONAL VESSEL  01/04/2021   IR ANGIOGRAM VISCERAL SELECTIVE  01/04/2021   IR US  GUIDE VASC ACCESS RIGHT  01/04/2021   LUMBAR LAMINECTOMY/DECOMPRESSION MICRODISCECTOMY N/A 07/03/2021   Procedure: Lumbar three through five decompression with lumbar three through five insitu fusion;  Surgeon: [name], MD;  Location[facility] OR;  Service: Orthopedics;  Laterality: N/A;   LUMBAR PERCUTANEOUS PEDICLE SCREW 2 LEVEL N/A 12/31/2023   Procedure: LUMBAR PERCUTANEOUS PEDICLE SCREW LUMBAR THREE-LUMBAR FIVE;  Surgeon: [name], MD;  Location: [facility] OR;  Service: Neurosurgery;  Laterality: N/A;   MEMBRANE PEEL Left 03/14/2014   Procedure: MEMBRANE PEEL; ENDOLASER;  Surgeon: [name], MD;  Location: [facility] OR;  Service: Ophthalmology;  Laterality: Left;   NM MYOVIEW  LTD  10/2015   LOW RISK. Small, fixed basal lateral defect - likely diaphragmatic attenuation. EF 69%   PARS PLANA VITRECTOMY Left 03/14/2014   Procedure: PARS PLANA VITRECTOMY WITH 25 GAUGE;  Surgeon: [name], MD;  Location: [facility] OR;  Service: Ophthalmology;  Laterality: Left;   shave  02/03/2022   angiofibroma   TONSILLECTOMY     TRANSTHORACIC ECHOCARDIOGRAM  01/07/2021   Normal EF 60 to 65%.  No R WMA.  GRII DD-moderately dilated LA..  Mildly dilated RV but normal function.  Normal RAP/CVP.  Trivial AI with mild to moderate sclerosis-no stenosis   UMBILICAL HERNIA REPAIR  2023   UPPER GASTROINTESTINAL ENDOSCOPY     WEIL OSTEOTOMY Right 09/01/2017   Procedure: RIGHT GREAT TOE CHEVRON AND WEIL OSTEOTOMY 2ND METATARSAL;  Surgeon: [name], MD;  Location: [facility] OR;  Service: Orthopedics;  Laterality: Right;   XI ROBOTIC ASSISTED VENTRAL HERNIA N/A 03/17/2022   Procedure: XI ROBOTIC ASSISTED VENTRAL HERNIA;  Surgeon: [name], MD;  Location: [facility];  Service: General;  Laterality: N/A;   Patient Active Problem List   Diagnosis Date Noted   Chronic hepatitis C (HCC) 03/09/2024   Malnutrition of moderate degree 01/06/2024   Spondylolisthesis at L4-L5 level 12/31/2023   Supine hypertension 12/03/2023   Parkinson's disease with neurogenic orthostatic hypotension (HCC) 12/03/2023   Preop cardiovascular exam 12/03/2023   Glaucoma 11/30/2023   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   Acquired hallux rigidus of right foot 11/23/2022   Ventral hernia without obstruction or gangrene    Primary open angle glaucoma of left eye, mild stage 06/23/2021   Facial numbness 12/24/2020   Neovascular glaucoma, right eye, stage unspecified 12/16/2020    Lumbar radiculopathy 11/25/2020   Gastroesophageal reflux disease 09/10/2020   Dry eyes, bilateral 08/15/2020   Presbycusis of right ear 01/17/2020   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   Secondary corneal edema of right eye 12/12/2019   Ptosis of right eyelid 12/12/2019   OAB (overactive bladder) 11/29/2019   Bunion of great toe of right foot 07/14/2017   Claw toe, acquired, right 07/14/2017   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   Medication management 10/09/2016   Plantar fasciitis of right foot 02/19/2015   Depression 08/14/2014   Family history of heart disease in male family member before age 47 04/26/2014   Mild cognitive impairment 03/13/2014   Parkinsonian tremor (HCC) 02/12/2014   Hyperlipidemia with target LDL less than 100    Abdominal aortic atherosclerosis (HCC)    Moderate essential hypertension 02/25/2011   Arthropathy 02/25/2011   HAV (hallux abducto valgus) 02/25/2011   ONSET DATE: 01/21/2024 (referral date)   REFERRING DIAG: M43.16 (ICD-10-CM) - Spondylolisthesis, lumbar  region  THERAPY DIAG:  Other lack of coordination  Unsteadiness on feet  Muscle weakness (generalized)  Visuospatial deficit  Other symptoms and signs involving the nervous system  Attention and concentration deficit  Rationale for Evaluation and Treatment: Rehabilitation  SUBJECTIVE:   SUBJECTIVE STATEMENT: Pain lower back 2/10.  No falls  Pt accompanied by: self  PERTINENT HISTORY: recent back surgery (L3-5) on 12/31/23, Parkinson's disease causing autonomic dysregulation and orthostatic hypotension, leading to blood pressure fluctuations and dizziness upon standing. Lumbar radiculopathy (Chronic), PVD, HLD, spinal stenosis, BPH   Post-surgical recovery complicated by inappropriate medication management and inadequate rehabilitation, with delirium post-anesthesia likely exacerbated by Parkinson's  disease.    PRECAUTIONS: Other: can take off back brace when seated or in bed and some walking in house, but wear it mostly when up. Back precautions (no BLT), no lifting over 10 lbs, orthostatic hypotension   WEIGHT BEARING RESTRICTIONS: No  PAIN:  Are you having pain? Yes: NPRS scale: 1-3/10  Pain location: back pain Pain description: fluctuates Aggravating factors: staying in one position too long Relieving factors: changing positions, OTC Meds  FALLS: Has patient fallen in last 6 months? Yes. Number of falls 1  LIVING ENVIRONMENT: Lives with: lives with their spouse Lives in: 2 story home, but lives on first floor, 3 steps to enter Has following equipment at home: Vannie - 2 wheeled, shower chair, and Grab bars  PLOF: Needs assistance with ADLs prior to surgery, enjoys music and traveling  PATIENT GOALS: improve function and speed with ADLS including handwriting  OBJECTIVE:  Note: Objective measures were completed at Evaluation unless otherwise noted.  BP assessed and [facility] for therapy visit.   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  MOBILITY STATUS: Independent  POSTURE COMMENTS:  increased lumbar lordosis, increased thoracic kyphosis, and posterior pelvic tilt   FUNCTIONAL OUTCOME MEASURES: Fastening/unfastening 3 buttons: 53 sec Physical performance test: PPT#2 (simulated eating)  27.21 & PPT#4 (donning/doffing jacket): 14.12 sec (w/ hospital gown)   COORDINATION: 9 Hole Peg test: Right: 31.60 sec; Left: 41.77 sec Box and Blocks:  Right: 37 blocks; Left: 37 blocks  UE ROM:  WFL   SENSATION: Fingertips get cold   COGNITION: Overall cognitive status: Impaired and decreased memory, processing speed  VISION:  Pt/family reports can only see light and images from Rt eye (? Legally blind), glaucoma and decreased vision Lt eye  OBSERVATIONS: Bradykinesia, Hypokinesia, and decline in cognition and overall function since back surgery  TREATMENT:   Issued and reviewed ways to prevent future PD related complications and memory strategies - see pt instructions for details. Added to binder.  Added mid day alarm to phone for medication reminder Reviewed bag ex's thoroughly - pt performed each x 5-10 reps with min cues PATIENT EDUCATION: Education details: see above Person educated: Patient Education method: Explanation, Demonstration, Verbal cues, and Handouts Education comprehension: verbalized understanding, returned demonstration, and verbal cues required   HOME EXERCISE PROGRAM: 02/17/2024: handwriting changes 02/24/2024: new behind head bag exercise handout 03/07/2024: Exercise Chart 03/14/2024: golf solitaire; coordination HEP 03/16/2024: Buttoning and jacket strategies 03/20/24: tremor strategies 03/22/24: ways to prevent future PD related complications and memory strategies  GOALS: Goals reviewed with patient? Yes  SHORT TERM GOALS: Target date: 03/10/24  Independent with PD specific HEP (Will need to adapt d/t current back precautions, consider adding bag ex's for ADLS) Baseline: Goal status: IN PROGRESS  2.  Pt to write name with 90% or greater legibility with only min micrographia Baseline:  Goal status: MET   3. Pt will demonstrate  improved ease with feeding as evidenced by decreasing PPT#2 by 8 secs or more Baseline: 27.21 sec Goal status: MET (03/20/24: 16 sec. Using white bowl d/t visual deficits)  4.  Pt will demonstrate improved ease with fastening buttons as evidenced by decreasing 3 button/unbutton time by 5 seconds or more Baseline: 53 sec Goal status: MET (03/20/24 = 32.46 sec w/ shirt ON d/t decreased vision)   5.  Pt will be able to place at least 3 additional blocks bilaterally with completion of Box and Blocks test. Baseline: Right: 37 blocks; Left: 37 blocks Goal status: INITIAL   LONG TERM GOALS: Target date: 04/10/24  Pt will verbalize understanding of ways to prevent future PD related complications and appropriate community resources prn  Baseline:  Goal status: IN PROGRESS   2.  Pt will verbalize understanding of ways to keep thinking skills sharp and ways to compensate for STM changes in the future  Baseline:  Goal status: IN PROGRESS  3.  Pt will verbalize understanding of adaptive strategies to increase ease with ADLS/IADLS   Baseline:  Goal status: INITIAL  4.  Pt will write a sentence with no significant decrease in size and maintain 90% legibility  Baseline:  Goal status: INITIAL  5.  Pt will demonstrate improved fine motor coordination for ADLs as evidenced by decreasing 9 hole peg test score for Lt hand by 6 secs  Baseline: 41.77 sec.  Goal status: INITIAL  6.  Pt will demonstrate improved fine motor coordination for ADLs as evidenced by decreasing 9 hole peg test score for Rt hand by 3 secs  Baseline: 31.60 sec Goal status: INITIAL  ASSESSMENT:  CLINICAL IMPRESSION: Patient demonstrates good understanding of education provided today.  Pt seems to be becoming more antiquated with therapy binder and recommended HEPs and education as needed to manage PD symptoms.   PERFORMANCE DEFICITS: in functional skills including ADLs, IADLs, coordination, dexterity, proprioception,  sensation, ROM, strength, pain, Fine motor control, Gross motor control, hearing, mobility, balance, body mechanics, endurance, decreased knowledge of use of DME, vision, and UE functional use, cognitive skills including memory and safety awareness, and psychosocial skills including coping strategies.   IMPAIRMENTS: are limiting patient from ADLs, IADLs, rest and sleep, leisure, and social participation.   COMORBIDITIES:  has co-morbidities such as recent back surgery with back precautions, orthostatic hypotension, fluctuating BP that affects occupational performance. Patient will benefit from skilled OT to  address above impairments and improve overall function.  REHAB POTENTIAL: Good  PLAN:  OT FREQUENCY: 2x/week  OT DURATION: 8 weeks  PLANNED INTERVENTIONS: 97535 self care/ADL training, 02889 therapeutic exercise, 97530 therapeutic activity, 97112 neuromuscular re-education, 97140 manual therapy, 97113 aquatic therapy, 97035 ultrasound, 97010 moist heat, 97129 Cognitive training (first 15 min), 02869 Cognitive training(each additional 15 min), 02249 Physical Performance Testing, passive range of motion, functional mobility training, visual/perceptual remediation/compensation, energy conservation, coping strategies training, patient/family education, and DME and/or AE instructions  RECOMMENDED OTHER SERVICES: will request speech therapy referral but will wait to make speech evaluation until after pt gets new hearing aids  CONSULTED AND AGREED WITH PLAN OF CARE: Patient and family member/caregiver  PLAN FOR NEXT SESSION: monitor BP (fluctuates), coordination, give handout on upcoming PD symposium   vision options for phone - [employer] - did he practice with [employer]? (Does not prefer [employer]);  Burnard JINNY Roads, OT 03/22/2024, 2:04 PM

## 2024-03-22 NOTE — Therapy (Signed)
 OUTPATIENT PHYSICAL THERAPY NEURO TREATMENT   Patient Name: [name] MRN: [id number] DOB:[birth year], 73 y.o., male Today's Date: 03/22/2024   PCP: [name], MD   REFERRING PROVIDER: Caleen Dirks, MD (sent to Harlene Bogaert, NP)  END OF SESSION:  PT End of Session - 03/22/24 1322     Visit Number 6    Number of Visits 13    Date for PT Re-Evaluation 04/08/24   due to potential delay in scheduling   Authorization Type [employer] Medicare    PT Start Time 1321   Pt arrived late   PT Stop Time 1400    PT Time Calculation (min) 39 min    Equipment Utilized During Treatment Gait belt    Activity Tolerance Patient tolerated treatment well    Behavior During Therapy WFL for tasks assessed/performed;Flat affect          Past Medical History:  Diagnosis Date   Allergy    seasonal   Anxiety    Arthritis    BPH (benign prostatic hyperplasia)    Complication of anesthesia    Pt. stated he had a reaction that ended in him requiring urinary cath placement; hx delirium worsening parkinson symptoms   Depression    Diverticulosis    GERD (gastroesophageal reflux disease)    esophageal spasms   Glaucoma    Gout    Head injury, closed, with concussion    Hepatitis C    chronic - Has been treated with Harvoni   HLD (hyperlipidemia)    statin intolerant (Crestor  & Simvastatin ) - Taking Livalo  1mg  / week   Hypertension    Neuromuscular disorder (HCC)    Parkinson's Disease   Parkinson's disease (HCC)    Plantar fasciitis    right   PVD (peripheral vascular disease) (HCC)    With no claudication; only mild abdominal aortic atherosclerosis noted on ultrasound.   Sleep apnea    Wears CPAP nightly   Spinal stenosis of lumbar region    Thoracic ascending aortic aneurysm (HCC)    4.2 cm ascending TAA 09/2016 CT, 1 yr f/u rec   Ulcer    Past Surgical History:  Procedure Laterality Date   ANTERIOR LAT LUMBAR FUSION N/A 12/31/2023   Procedure: Prone trans-psoas interbody fusion  - Lumbar three-Lumbar four - Lumbar four-Lumbar five, right facetectomy Lumbar four-five;  Surgeon: [name], MD;  Location: [facility];  Service: Neurosurgery;  Laterality: N/A;   BUNIONECTOMY WITH WEIL OSTEOTOMY Right 11/02/2019   Procedure: Right Foot Lapidus, Modified McBride Bunionectomy,  Hallux Akin Osteotomy;  Surgeon: [name], MD;  Location: [facility];  Service: Orthopedics;  Laterality: Right;   CARDIAC CATHETERIZATION  2005   30% Cx. Dr. Lavon   cataract surgery Left 11/05/2014   COLONOSCOPY     COLONOSCOPY N/A 01/09/2021   Procedure: COLONOSCOPY;  Surgeon: [name], MD;  Location: [facility];  Service: Gastroenterology;  Laterality: N/A;   ESOPHAGOGASTRODUODENOSCOPY N/A 05/02/2015   Procedure: ESOPHAGOGASTRODUODENOSCOPY (EGD);  Surgeon: [name], MD;  Location: [facility];  Service: Endoscopy;  Laterality: N/A;   ESOPHAGOGASTRODUODENOSCOPY  05/02/2015   no source of pt chest pain endoscopically evident. small hiatal hernia.   ESOPHAGOGASTRODUODENOSCOPY (EGD) WITH PROPOFOL  N/A 01/02/2021   Procedure: ESOPHAGOGASTRODUODENOSCOPY (EGD) WITH PROPOFOL ;  Surgeon: [name], MD;  Location: [facility];  Service: Endoscopy;  Laterality: N/A;   HARDWARE REMOVAL Right 11/02/2019   Procedure: Second Metatarsal Removal of Deep Implant and Rotational Osteotomy;  Surgeon: [name], MD;  Location: [facility];  Service: Orthopedics;  Laterality: Right;   IR ANGIOGRAM SELECTIVE EACH ADDITIONAL VESSEL  01/04/2021   IR ANGIOGRAM SELECTIVE EACH ADDITIONAL VESSEL  01/04/2021   IR ANGIOGRAM VISCERAL SELECTIVE  01/04/2021   IR US  GUIDE VASC ACCESS RIGHT  01/04/2021   LUMBAR LAMINECTOMY/DECOMPRESSION MICRODISCECTOMY N/A 07/03/2021   Procedure: Lumbar three through five decompression with lumbar three through five insitu fusion;  Surgeon: [name], MD;  Location: [facility] OR;  Service: Orthopedics;  Laterality: N/A;   LUMBAR PERCUTANEOUS PEDICLE  SCREW 2 LEVEL N/A 12/31/2023   Procedure: LUMBAR PERCUTANEOUS PEDICLE SCREW LUMBAR THREE-LUMBAR FIVE;  Surgeon: [name], MD;  Location: [facility];  Service: Neurosurgery;  Laterality: N/A;   MEMBRANE PEEL Left 03/14/2014   Procedure: MEMBRANE PEEL; ENDOLASER;  Surgeon: [name], MD;  Location: [facility];  Service: Ophthalmology;  Laterality: Left;   NM MYOVIEW  LTD  10/2015   LOW RISK. Small, fixed basal lateral defect - likely diaphragmatic attenuation. EF 69%   PARS PLANA VITRECTOMY Left 03/14/2014   Procedure: PARS PLANA VITRECTOMY WITH 25 GAUGE;  Surgeon: [name], MD;  Location: [facility] OR;  Service: Ophthalmology;  Laterality: Left;   shave  02/03/2022   angiofibroma   TONSILLECTOMY     TRANSTHORACIC ECHOCARDIOGRAM  01/07/2021   Normal EF 60 to 65%.  No R WMA.  GRII DD-moderately dilated LA..  Mildly dilated RV but normal function.  Normal RAP/CVP.  Trivial AI with mild to moderate sclerosis-no stenosis   UMBILICAL HERNIA REPAIR  2023   UPPER GASTROINTESTINAL ENDOSCOPY     WEIL OSTEOTOMY Right 09/01/2017   Procedure: RIGHT GREAT TOE CHEVRON AND WEIL OSTEOTOMY 2ND METATARSAL;  Surgeon: [name], MD;  Location: [facility];  Service: Orthopedics;  Laterality: Right;   XI ROBOTIC ASSISTED VENTRAL HERNIA N/A 03/17/2022   Procedure: XI ROBOTIC ASSISTED VENTRAL HERNIA;  Surgeon: [name], MD;  Location: [facility];  Service: General;  Laterality: N/A;   Patient Active Problem List   Diagnosis Date Noted   Chronic hepatitis C (HCC) 03/09/2024   Malnutrition of moderate degree 01/06/2024   Spondylolisthesis at L4-L5 level 12/31/2023   Supine hypertension 12/03/2023   Parkinson's disease with neurogenic orthostatic hypotension (HCC) 12/03/2023   Preop cardiovascular exam 12/03/2023   Glaucoma 11/30/2023   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   Acquired hallux rigidus of right foot 11/23/2022   Ventral hernia without obstruction or gangrene    Primary open angle glaucoma of left  eye, mild stage 06/23/2021   Facial numbness 12/24/2020   Neovascular glaucoma, right eye, stage unspecified 12/16/2020   Lumbar radiculopathy 11/25/2020   Gastroesophageal reflux disease 09/10/2020   Dry eyes, bilateral 08/15/2020   Presbycusis of right ear 01/17/2020   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   Secondary corneal edema of right eye 12/12/2019   Ptosis of right eyelid 12/12/2019   OAB (overactive bladder) 11/29/2019   Bunion of great toe of right foot 07/14/2017   Claw toe, acquired, right 07/14/2017   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   Medication management 10/09/2016   Plantar fasciitis of right foot 02/19/2015   Depression 08/14/2014   Family history of heart disease in male family member before age 40 04/26/2014   Mild cognitive impairment 03/13/2014   Parkinsonian tremor (HCC) 02/12/2014   Hyperlipidemia with target LDL less than 100    Abdominal aortic atherosclerosis (HCC)    Moderate essential hypertension 02/25/2011  Arthropathy 02/25/2011   HAV (hallux abducto valgus) 02/25/2011    ONSET DATE: 01/19/2024  REFERRING DIAG: M43.16 (ICD-10-CM) - Spondylolisthesis, lumbar region  THERAPY DIAG:  Muscle weakness (generalized)  Other abnormalities of gait and mobility  Unsteadiness on feet  Other lack of coordination  Rationale for Evaluation and Treatment: Rehabilitation  SUBJECTIVE:                                                                                                                                                                                             SUBJECTIVE STATEMENT: No falls, exercises are going well. No pain to report today.   Pt accompanied by: self  PERTINENT HISTORY: PMH: s/p L3-5 prone trans-psoas approach for lateral interbody fusion; R L4-5 facetectomy and posterior segmental instrumentation L3-5 (12/2023). PMH significant for PD  (complicated by tremor, gait impairment, REM sleep behavior and cognitive impairment ), HTN, DM, gout, glaucoma, PVD   Per note from [facility]: He underwent back surgery on 6/6 (L2-5 lateral interbody fusion with posterior instrumentation) with postop confusion and delirium requiring restraints,  improved with use of Seroquel  and discontinuing opioids and muscle relaxants, suspected confusion due to GA, PD and possibly medication side effects.  He was discharged on Seroquel  25/50.  He was discharged to [facility] rehab on 6/18, has not yet started therapies. He continues to have occasional confusion although some what improving per wife, at times with paranoia and visual hallucinations, also has been wandering and currently wearing locator band on ankle   PAIN:  Are you having pain? No and a little soreness from doing the exercises      PRECAUTIONS: Back, Fall, and Other: hx of orthostatics, pt's spouse has to remind him of his precautions    FALLS: Has patient fallen in last 6 months? Yes. Number of falls 1, had a fall getting up and fell back against the window, this happened about a week or 2 before the surgery   LIVING ENVIRONMENT: Lives with: lives with their spouse Lives in: House/apartment Stairs: Yes: Internal: 15 steps; on right going up and External: 2 on the front porch and 3 on the side steps; can reach both, doesn't have to go up indoor steps, can live on the bottom floor  Has following equipment at home: Walker - 2 wheeled, shower chair, and Grab bars  PLOF: Independent and Leisure: music, traveling Pt's spouse has to remind him of brace precautions for ADLs   PATIENT GOALS: Wants to be able to be more independent, stronger, and more mobile   OBJECTIVE:  Note: Objective measures were completed at Evaluation unless otherwise noted.  COGNITION:  Overall cognitive status: Impaired   SENSATION: Light touch: WFL Reports numbness/tingling intermittently down front of BLE    COORDINATION: Heel to shin: slightly harder with LLE   POSTURE: rounded shoulders and forward head   LOWER EXTREMITY MMT:    MMT Right Eval Left Eval  Hip flexion 5 4+  Hip extension    Hip abduction 4 4  Hip adduction 4+ 4+  Hip internal rotation    Hip external rotation    Knee flexion 4+ 4+  Knee extension 4 -4  Ankle dorsiflexion 5 5  Ankle plantarflexion    Ankle inversion    Ankle eversion    (Blank rows = not tested)  All tested in sitting   BED MOBILITY:  Pt reports will feel stuck trying to get his legs in and out of the bed. Reviewed the log roll technique for bed mobility (PT demonstrating on the bed)  TRANSFERS: Sit to stand: SBA  Assistive device utilized: None     Stand to sit: SBA  Assistive device utilized: None      Mainly performs with UE support, but can perform without, with intermittent retropulsion  GAIT: Findings: Gait Characteristics: step through pattern, decreased arm swing- Right, decreased arm swing- Left, decreased stride length, decreased ankle dorsiflexion- Right, decreased ankle dorsiflexion- Left, Right foot flat, Left foot flat, shuffling, decreased trunk rotation, and narrow BOS, Distance walked: Clinic distances , Assistive device utilized:None, Level of assistance: SBA and CGA, and Comments: intermittent CGA during turns due to pt with narrow BOS   FUNCTIONAL TESTS:  5 times sit to stand: 21.4 seconds with no UE support, last rep pt with an episode of retropulsion  10 meter walk test: 11.5 seconds with no AD = 2.85 ft/sec     [facility] PT Assessment - 02/08/24 1440       Timed Up and Go Test   Normal TUG (seconds) 11.8    Manual TUG (seconds) 12.8    Cognitive TUG (seconds) 22   attempting retro counting by 3s, pt tends to add instead of subtract   TUG Comments pt with narrow BOS when turning with intermittent CGA        VITALS  Vitals:   03/22/24 1326  BP: 131/88  Pulse: (!) 59                                                                                                                                  TREATMENT:  Self-care/home management  Assessed vitals (see above) and WNL this date    Ther Act  SciFit multi-peaks level 8.5 for 8 minutes using BUE/BLEs for neural priming for reciprocal movement, dynamic cardiovascular warmup and increased amplitude of stepping. RPE of 5/10 following activity   NMR  In // bars, alt fwd adv retreat over 6 hurdle, x10 reps per side w/BUE support initially and progressing to no UE support. Increased difficulty clearing LLE and CGA-min A required  throughout. Min cues for LARGE steps, which pt able to do well unless distracted/talking.  Progressed to lateral adv/retreat over 6 hurdle, x12 reps per side. Increased step clearance noted in lateral direction. Continued difficulty w/LLE.  At ballet bar w/5 Blaze pods on random reach setting on mirror (2 on L and 3 on R side) for improved lateral weight shifting, truncal mobility and dual-tasking.  Performed on 2 minute intervals with 30-60s rest periods.  Pt requires CGA guarding. Round 1:  Standing on blue beam w/cues to cross-body reach pods.  36 hits. Round 2:  Same setup w/added cog dual task (naming food if pods are red and animal if pods are green).  13 hits. Round 3:  same setup as round 2.  9 hits. Notable errors/deficits:  Increased difficulty maintaining cross-body reach w/added dual-task added.    PATIENT EDUCATION: Education details: continue HEP  Person educated: Patient Education method: Explanation, Demonstration, and Verbal cues Education comprehension: verbalized understanding and returned demonstration   HOME EXERCISE PROGRAM: Standing PWR Moves  Access Code: [email or web address] URL: [email or web address] Date: 03/07/2024 Prepared by: Sheffield Senate  Exercises - Sit to Stand  - 1-2 x daily - 4-5 x weekly - 2 sets - 10 reps  GOALS: Goals reviewed with patient? Yes  SHORT TERM GOALS: Target  date: 02/29/2024  Pt will be independent with initial HEP for improved strength, balance, transfers and gait. Baseline: Goal status: INITIAL  2.  miniBEST to be assessed with LTG written.  Baseline: 20/28 Goal status: MET  3.  Pt will improve 5x sit<>stand to less than or equal to 18 sec to demonstrate improved functional strength and transfer efficiency.   Baseline: 21.4 seconds with no UE support, last rep pt with an episode of retropulsion   15.4 seconds with no UE support  Goal status: MET    LONG TERM GOALS: Target date: 03/21/2024 Updated goal date to reflect POC/last appt date: 03/30/24  Pt will verbalize understanding of local PD community resources, including fitness post DC.  Baseline:  Goal status: INITIAL  2.  Pt will improve miniBEST to at least a 23/28 in order to demo decr fall risk.  Baseline: 20/28 Goal status: INITIAL  3.  Pt will perform cog TUG in 18 seconds or less in order to demo improved dual tasking.  Baseline: 22 seconds Goal status: INITIAL  4.  Pt will improve gait speed with no AD vs. LRAD to at least 3.1 ft/sec in order to demo improved community mobility.   Baseline: 11.5 seconds with no AD = 2.85 ft/sec  10.9 seconds with no AD = 3.0 ft/sec 8/25) Goal status: ON-GOING  5.  Pt will improve 5x sit<>stand to less than or equal to 16 sec to demonstrate improved functional strength and transfer efficiency.  Baseline: 21.4 seconds with no UE support, last rep pt with an episode of retropulsion   14.6 seconds with no UE support (8/25) Goal status: MET   ASSESSMENT:  CLINICAL IMPRESSION: Emphasis of skilled PT session on improved stepping strategy, step clearance and truncal mobility. Pt demonstrates decreased step clearance w/LLE, frequently knocking obstacles over. Pt able to perform large step if cued and focused on task, but w/addition of cog dual-task, pt resumes small steps. Pt very challenged by cog dual-task during blaze pod activity and  noted loss of cross-body reach w/task, which he was able to perform well w/o dual-task. Continue POC.     OBJECTIVE IMPAIRMENTS: Abnormal gait, decreased activity tolerance, decreased balance, decreased cognition, decreased  coordination, decreased endurance, decreased mobility, difficulty walking, decreased strength, decreased safety awareness, hypomobility, impaired sensation, and postural dysfunction.   ACTIVITY LIMITATIONS: carrying, lifting, bending, stairs, transfers, and locomotion level  PARTICIPATION LIMITATIONS: driving, shopping, community activity, and yard work  PERSONAL FACTORS: Age, Behavior pattern, Past/current experiences, Time since onset of injury/illness/exacerbation, and 3+ comorbidities: s/p L3-5 prone trans-psoas approach for lateral interbody fusion; R L4-5 facetectomy and posterior segmental instrumentation L3-5 (12/2023). PMH significant for PD (complicated by tremor, gait impairment, REM sleep behavior and cognitive impairment ), HTN, DM, gout, glaucoma, PVD, orthostatic hypotension  are also affecting patient's functional outcome.   REHAB POTENTIAL: Good  CLINICAL DECISION MAKING: Evolving/moderate complexity  EVALUATION COMPLEXITY: Moderate  PLAN:  PT FREQUENCY: 2x/week  PT DURATION: 8 weeks - anticipate just 6 weeks   PLANNED INTERVENTIONS: 97164- PT Re-evaluation, 97750- Physical Performance Testing, 97110-Therapeutic exercises, 97530- Therapeutic activity, 97112- Neuromuscular re-education, 97535- Self Care, [id number]- Manual therapy, [phone]- Gait training, Patient/Family education, Balance training, Stair training, and DME instructions  PLAN FOR NEXT SESSION: check BP!, work on wider BOS and turning tasks, stepping strategies, SLS, also add in core stability exercises after back surgery Cog dual tasking with balance tasks   Pt wanted to see how he is doing at last scheduled appt/remainder of his goal check to determine if he wanted to add more visits or not     Paelyn Smick E Omayra Tulloch, PT, DPT 03/22/2024, 2:05 PM

## 2024-03-22 NOTE — Patient Instructions (Addendum)
 Ways to prevent future Parkinson's related complications:  1.   Exercise regularly/daily. Challenge yourself SAFELY and try to get different types of exercise including: PWR! Moves, Cardio (cycling), strength and balance training, and stretches (Yoga)   2.   Focus on BIGGER movements during daily activities- really reach overhead, straighten elbows and extend fingers  3.   When dressing (especially jacket/coat) or reaching for your seatbelt make sure to use your body to assist by twisting while you reach and looking at where you are reaching - this can help to minimize stress on the shoulder and reduce the risk of a rotator cuff tear  4.   Swing your arms when you walk (unless using walker)! People with PD are at increased risk for frozen shoulder and swinging your arms can reduce this risk.  5. Drink plenty of water  and eat a high fiber diet (fresh fruits and veggies, nuts/seeds, beans, some fish). Avoid canned foods, reduce sugar intake and dairy when possible  6. Do NOT take your Parkinson's medication around meals (take at least 30 min before a meal, or 1 hour after a meal), especially protein as it blocks the absorption of the medicine and will not work effectively  7. Keep your feet apart when you are standing (wider stance) to allow you to have better balance and to reach further with your arms. Also make sure your feet are apart before standing up.   8. Stay social! - Go out with friends, play card games with others, join a community group or community PD exercise class  9. Get a good night's sleep! Staying active during the day and developing a good bedtime routine can help with this.   10. Communicate with your neurologist any concerns and maintain overall health - keep blood pressure regulated, reduce stress/anxiety, good nutrition/gut health, etc.            Memory Compensation Strategies  Use "WARM" strategy. W= write it down A=  associate it R=  repeat it M=  make a  mental picture  You can keep a Memory Notebook. Use a 3-ring notebook with sections for the following:  calendar, important names and phone numbers, medications, doctors' names/phone numbers, "to do list"/reminders, and a section to journal what you did each day  Use a calendar to write appointments down.  Write yourself a schedule for the day.  This can be placed on the calendar or in a separate section of the Memory Notebook.  Keeping a regular schedule can help memory.  Use medication organizer with sections for each day or morning/evening pills  You may need help loading it  Keep a basket, or pegboard by the door.   Place items that you need to take out with you in the basket or on the pegboard.  You may also want to include a message board for reminders.  Use sticky notes. Place sticky notes with reminders in a place where the task is performed.  For example:  "turn off the stove" placed by the stove, "lock the door" placed on the door at eye level, "take your medications" on the bathroom mirror or by the place where you normally take your medications  Use alarms, timers, and/or a reminder app. Use while cooking to remind yourself to check on food or as a reminder to take your medicine, or as a reminder to make a call, or as a reminder to perform another task, etc.  Use a voice recorder app or small tape recorder  to record important information and notes for yourself. Go back at the end of the day and listen to these.

## 2024-03-28 ENCOUNTER — Ambulatory Visit: Attending: Adult Health | Admitting: Occupational Therapy

## 2024-03-28 ENCOUNTER — Ambulatory Visit: Admitting: Physical Therapy

## 2024-03-28 ENCOUNTER — Encounter: Payer: Self-pay | Admitting: Occupational Therapy

## 2024-03-28 ENCOUNTER — Encounter: Payer: Self-pay | Admitting: Physical Therapy

## 2024-03-28 ENCOUNTER — Other Ambulatory Visit (HOSPITAL_COMMUNITY): Payer: Self-pay

## 2024-03-28 ENCOUNTER — Other Ambulatory Visit: Payer: Self-pay

## 2024-03-28 VITALS — BP 84/57 | HR 66

## 2024-03-28 DIAGNOSIS — R251 Tremor, unspecified: Secondary | ICD-10-CM | POA: Diagnosis not present

## 2024-03-28 DIAGNOSIS — R278 Other lack of coordination: Secondary | ICD-10-CM

## 2024-03-28 DIAGNOSIS — M6281 Muscle weakness (generalized): Secondary | ICD-10-CM | POA: Diagnosis not present

## 2024-03-28 DIAGNOSIS — G20A2 Parkinson's disease without dyskinesia, with fluctuations: Secondary | ICD-10-CM | POA: Diagnosis not present

## 2024-03-28 DIAGNOSIS — R471 Dysarthria and anarthria: Secondary | ICD-10-CM | POA: Insufficient documentation

## 2024-03-28 DIAGNOSIS — R293 Abnormal posture: Secondary | ICD-10-CM | POA: Insufficient documentation

## 2024-03-28 DIAGNOSIS — R41840 Attention and concentration deficit: Secondary | ICD-10-CM | POA: Insufficient documentation

## 2024-03-28 DIAGNOSIS — R2681 Unsteadiness on feet: Secondary | ICD-10-CM | POA: Diagnosis not present

## 2024-03-28 DIAGNOSIS — R29818 Other symptoms and signs involving the nervous system: Secondary | ICD-10-CM | POA: Insufficient documentation

## 2024-03-28 DIAGNOSIS — R41842 Visuospatial deficit: Secondary | ICD-10-CM | POA: Insufficient documentation

## 2024-03-28 DIAGNOSIS — M5459 Other low back pain: Secondary | ICD-10-CM | POA: Diagnosis not present

## 2024-03-28 DIAGNOSIS — R1312 Dysphagia, oropharyngeal phase: Secondary | ICD-10-CM | POA: Diagnosis not present

## 2024-03-28 DIAGNOSIS — R41841 Cognitive communication deficit: Secondary | ICD-10-CM | POA: Diagnosis present

## 2024-03-28 NOTE — Patient Instructions (Signed)
 Keeping Thinking Skills Sharp:  1. Jigsaw puzzles  2. Card/board games  3. Talking on the phone/going to social events  4. [email or web address]  5. Online games  6. Word searches/crossword puzzles  7.  Logic puzzles  8. Aerobic exercise (stationary bike)  9. Eating balanced diet (fruits & veggies)  10. Drink water  11. Try something new--new recipe, hobby  12. Crafts  13. Do a variety of activities that are challenging  14.  Plan weekly meals and write a grocery list  15. Add cognitive activities to walking/exercising (think of animal/food/city with each letter of the alphabet, counting backwards, thinking of as many vegetables as you can, etc.).--Only do this if safe (no freezing/falls).

## 2024-03-28 NOTE — Therapy (Signed)
 OUTPATIENT OCCUPATIONAL THERAPY [facility]/10th progress note  Patient Name: [name] MRN: [id number] DOB:January 14, 1951, 73 y.o., male Today's Date: 03/28/2024  PCP: [name], MD REFERRING PROVIDER: Caleen Dirks, MD  END OF SESSION:  OT End of Session - 03/28/24 1454     Visit Number 10    Number of Visits 16    Date for OT Re-Evaluation 04/10/24    Authorization Type [employer] 2025 - Auth required    Progress Note Due on Visit 10    OT Start Time 1447    OT Stop Time 1530    OT Time Calculation (min) 43 min    Activity Tolerance Patient tolerated treatment well    Behavior During Therapy WFL for tasks assessed/performed         Past Medical History:  Diagnosis Date   Allergy    seasonal   Anxiety    Arthritis    BPH (benign prostatic hyperplasia)    Complication of anesthesia    Pt. stated he had a reaction that ended in him requiring urinary cath placement; hx delirium worsening parkinson symptoms   Depression    Diverticulosis    GERD (gastroesophageal reflux disease)    esophageal spasms   Glaucoma    Gout    Head injury, closed, with concussion    Hepatitis C    chronic - Has been treated with Harvoni   HLD (hyperlipidemia)    statin intolerant (Crestor  & Simvastatin ) - Taking Livalo  1mg  / week   Hypertension    Neuromuscular disorder (HCC)    Parkinson's Disease   Parkinson's disease (HCC)    Plantar fasciitis    right   PVD (peripheral vascular disease) (HCC)    With no claudication; only mild abdominal aortic atherosclerosis noted on ultrasound.   Sleep apnea    Wears CPAP nightly   Spinal stenosis of lumbar region    Thoracic ascending aortic aneurysm (HCC)    4.2 cm ascending TAA 09/2016 CT, 1 yr f/u rec   Ulcer    Past Surgical History:  Procedure Laterality Date   ANTERIOR LAT LUMBAR FUSION N/A 12/31/2023   Procedure: Prone trans-psoas interbody fusion - Lumbar three-Lumbar four - Lumbar four-Lumbar five, right facetectomy  Lumbar four-five;  Surgeon: [name], MD;  Location: [facility];  Service: Neurosurgery;  Laterality: N/A;   BUNIONECTOMY WITH WEIL OSTEOTOMY Right 11/02/2019   Procedure: Right Foot Lapidus, Modified McBride Bunionectomy,  Hallux Akin Osteotomy;  Surgeon: [name], MD;  Location: [facility];  Service: Orthopedics;  Laterality: Right;   CARDIAC CATHETERIZATION  2005   30% Cx. Dr. Lavon   cataract surgery Left 11/05/2014   COLONOSCOPY     COLONOSCOPY N/A 01/09/2021   Procedure: COLONOSCOPY;  Surgeon: [name], MD;  Location: [facility];  Service: Gastroenterology;  Laterality: N/A;   ESOPHAGOGASTRODUODENOSCOPY N/A 05/02/2015   Procedure: ESOPHAGOGASTRODUODENOSCOPY (EGD);  Surgeon: [name], MD;  Location: [facility];  Service: Endoscopy;  Laterality: N/A;   ESOPHAGOGASTRODUODENOSCOPY  05/02/2015   no source of pt chest pain endoscopically evident. small hiatal hernia.   ESOPHAGOGASTRODUODENOSCOPY (EGD) WITH PROPOFOL  N/A 01/02/2021   Procedure: ESOPHAGOGASTRODUODENOSCOPY (EGD) WITH PROPOFOL ;  Surgeon: [name], MD;  Location: [facility];  Service: Endoscopy;  Laterality: N/A;   HARDWARE REMOVAL Right 11/02/2019   Procedure: Second Metatarsal Removal of Deep Implant and Rotational Osteotomy;  Surgeon: [name], MD;  Location: [facility];  Service: Orthopedics;  Laterality: Right;   IR ANGIOGRAM SELECTIVE  EACH ADDITIONAL VESSEL  01/04/2021   IR ANGIOGRAM SELECTIVE EACH ADDITIONAL VESSEL  01/04/2021   IR ANGIOGRAM VISCERAL SELECTIVE  01/04/2021   IR US  GUIDE VASC ACCESS RIGHT  01/04/2021   LUMBAR LAMINECTOMY/DECOMPRESSION MICRODISCECTOMY N/A 07/03/2021   Procedure: Lumbar three through five decompression with lumbar three through five insitu fusion;  Surgeon: [name], MD;  Location: [facility];  Service: Orthopedics;  Laterality: N/A;   LUMBAR PERCUTANEOUS PEDICLE SCREW 2 LEVEL N/A 12/31/2023   Procedure: LUMBAR PERCUTANEOUS PEDICLE  SCREW LUMBAR THREE-LUMBAR FIVE;  Surgeon: [name], MD;  Location: [facility];  Service: Neurosurgery;  Laterality: N/A;   MEMBRANE PEEL Left 03/14/2014   Procedure: MEMBRANE PEEL; ENDOLASER;  Surgeon: [name], MD;  Location: [facility];  Service: Ophthalmology;  Laterality: Left;   NM MYOVIEW  LTD  10/2015   LOW RISK. Small, fixed basal lateral defect - likely diaphragmatic attenuation. EF 69%   PARS PLANA VITRECTOMY Left 03/14/2014   Procedure: PARS PLANA VITRECTOMY WITH 25 GAUGE;  Surgeon: [name], MD;  Location: [facility] OR;  Service: Ophthalmology;  Laterality: Left;   shave  02/03/2022   angiofibroma   TONSILLECTOMY     TRANSTHORACIC ECHOCARDIOGRAM  01/07/2021   Normal EF 60 to 65%.  No R WMA.  GRII DD-moderately dilated LA..  Mildly dilated RV but normal function.  Normal RAP/CVP.  Trivial AI with mild to moderate sclerosis-no stenosis   UMBILICAL HERNIA REPAIR  2023   UPPER GASTROINTESTINAL ENDOSCOPY     WEIL OSTEOTOMY Right 09/01/2017   Procedure: RIGHT GREAT TOE CHEVRON AND WEIL OSTEOTOMY 2ND METATARSAL;  Surgeon: [name], MD;  Location: [facility];  Service: Orthopedics;  Laterality: Right;   XI ROBOTIC ASSISTED VENTRAL HERNIA N/A 03/17/2022   Procedure: XI ROBOTIC ASSISTED VENTRAL HERNIA;  Surgeon: [name], MD;  Location: [facility];  Service: General;  Laterality: N/A;   Patient Active Problem List   Diagnosis Date Noted   Chronic hepatitis C (HCC) 03/09/2024   Malnutrition of moderate degree 01/06/2024   Spondylolisthesis at L4-L5 level 12/31/2023   Supine hypertension 12/03/2023   Parkinson's disease with neurogenic orthostatic hypotension (HCC) 12/03/2023   Preop cardiovascular exam 12/03/2023   Glaucoma 11/30/2023   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   Acquired hallux rigidus of right foot 11/23/2022   Ventral hernia without obstruction or gangrene    Primary open angle glaucoma of left eye, mild stage 06/23/2021   Facial numbness 12/24/2020   Neovascular  glaucoma, right eye, stage unspecified 12/16/2020   Lumbar radiculopathy 11/25/2020   Gastroesophageal reflux disease 09/10/2020   Dry eyes, bilateral 08/15/2020   Presbycusis of right ear 01/17/2020   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   Secondary corneal edema of right eye 12/12/2019   Ptosis of right eyelid 12/12/2019   OAB (overactive bladder) 11/29/2019   Bunion of great toe of right foot 07/14/2017   Claw toe, acquired, right 07/14/2017   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   Medication management 10/09/2016   Plantar fasciitis of right foot 02/19/2015   Depression 08/14/2014   Family history of heart disease in male family member before age 48 04/26/2014   Mild cognitive impairment 03/13/2014   Parkinsonian tremor (HCC) 02/12/2014   Hyperlipidemia with target LDL less than 100    Abdominal aortic atherosclerosis (HCC)    Moderate essential hypertension 02/25/2011   Arthropathy 02/25/2011   HAV (hallux abducto valgus) 02/25/2011   ONSET DATE: 01/21/2024 (  referral date)   REFERRING DIAG: M43.16 (ICD-10-CM) - Spondylolisthesis, lumbar region  THERAPY DIAG:  Other lack of coordination  Unsteadiness on feet  Muscle weakness (generalized)  Visuospatial deficit  Other symptoms and signs involving the nervous system  Attention and concentration deficit  Rationale for Evaluation and Treatment: Rehabilitation  SUBJECTIVE:   SUBJECTIVE STATEMENT: Pain lower back 2/10.  No falls  Pt accompanied by: self  PERTINENT HISTORY: recent back surgery (L3-5) on 12/31/23, Parkinson's disease causing autonomic dysregulation and orthostatic hypotension, leading to blood pressure fluctuations and dizziness upon standing. Lumbar radiculopathy (Chronic), PVD, HLD, spinal stenosis, BPH   Post-surgical recovery complicated by inappropriate medication management and inadequate rehabilitation, with delirium  post-anesthesia likely exacerbated by Parkinson's disease.    PRECAUTIONS: Other: can take off back brace when seated or in bed and some walking in house, but wear it mostly when up. Back precautions (no BLT), no lifting over 10 lbs, orthostatic hypotension   WEIGHT BEARING RESTRICTIONS: No  PAIN:  Are you having pain? Yes: NPRS scale: 1-3/10  Pain location: back pain Pain description: fluctuates Aggravating factors: staying in one position too long Relieving factors: changing positions, OTC Meds  FALLS: Has patient fallen in last 6 months? Yes. Number of falls 1  LIVING ENVIRONMENT: Lives with: lives with their spouse Lives in: 2 story home, but lives on first floor, 3 steps to enter Has following equipment at home: Vannie - 2 wheeled, shower chair, and Grab bars  PLOF: Needs assistance with ADLs prior to surgery, enjoys music and traveling  PATIENT GOALS: improve function and speed with ADLS including handwriting  OBJECTIVE:  Note: Objective measures were completed at Evaluation unless otherwise noted.  BP assessed and [facility] for therapy visit.   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  MOBILITY STATUS: Independent  POSTURE COMMENTS:  increased lumbar lordosis, increased thoracic kyphosis, and posterior pelvic tilt   FUNCTIONAL OUTCOME MEASURES: Fastening/unfastening 3 buttons: 53  sec Physical performance test: PPT#2 (simulated eating) 27.21 & PPT#4 (donning/doffing jacket): 14.12 sec (w/ hospital gown)   COORDINATION: 9 Hole Peg test: Right: 31.60 sec; Left: 41.77 sec Box and Blocks:  Right: 37 blocks; Left: 37 blocks  UE ROM:  WFL   SENSATION: Fingertips get cold   COGNITION: Overall cognitive status: Impaired and decreased memory, processing speed  VISION:  Pt/family reports can only see light and images from Rt eye (? Legally blind), glaucoma and decreased vision Lt eye  OBSERVATIONS: Bradykinesia, Hypokinesia, and decline in cognition and overall function since back surgery  TREATMENT:   Reviewed coordination HEP. Also practiced flipping cards over opening hand big prior to flipping, and dealing cards with thumb Pt issued ways to keep thinking skills sharp and reviewed - see pt instructions for details.  Pt issued info on POP group and upcoming PD symposium (wife reports she has registered both herself and pt already) UBE x 5 mintues, level 1 for UE strength/endurance and increasing HR keeping RPM > 40   PATIENT EDUCATION: Education details: see above Person educated: Patient Education method: Explanation, Demonstration, Verbal cues, and Handouts Education comprehension: verbalized understanding, returned demonstration, and verbal cues required   HOME EXERCISE PROGRAM: 02/17/2024: handwriting changes 02/24/2024: new behind head bag exercise handout 03/07/2024: Exercise Chart 03/14/2024: golf solitaire; coordination HEP 03/16/2024: Buttoning and jacket strategies 03/20/24: tremor strategies 03/22/24: ways to prevent future PD related complications and memory strategies 03/28/24: ways to keep thinking skills sharp  GOALS: Goals reviewed with patient? Yes  SHORT TERM GOALS: Target date: 03/10/24  Independent with PD specific HEP (Will need  to adapt d/t current back precautions, consider adding bag ex's for ADLS) Baseline: Goal status: MET  2.  Pt to write name with 90% or greater legibility with only min micrographia Baseline:  Goal status: MET   3. Pt will demonstrate improved ease with feeding as evidenced by decreasing PPT#2 by 8 secs or more Baseline: 27.21 sec Goal status: MET (03/20/24: 16 sec. Using white bowl d/t visual deficits)  4.  Pt will demonstrate improved ease with fastening buttons as evidenced by decreasing 3 button/unbutton time by 5 seconds or more Baseline: 53 sec Goal status: MET (03/20/24 = 32.46 sec w/ shirt ON d/t decreased vision)   5.  Pt will be able to place at least 3 additional blocks bilaterally with completion of Box and Blocks test. Baseline: Right: 37 blocks; Left: 37 blocks Goal status: INITIAL   LONG TERM GOALS: Target date: 04/10/24  Pt will verbalize understanding of ways to prevent future PD related complications and appropriate community resources prn  Baseline:  Goal status: MET  2.  Pt will verbalize understanding of ways to keep thinking skills sharp and ways to compensate for STM changes in the future  Baseline:  Goal status: MET  3.  Pt will verbalize understanding of adaptive strategies to increase ease with ADLS/IADLS   Baseline:  Goal status: MET  4.  Pt will write a sentence with no significant decrease in size and maintain 90% legibility  Baseline:  Goal status: INITIAL  5.  Pt will demonstrate improved fine motor coordination for ADLs as evidenced by decreasing 9 hole peg test score for Lt hand by 6 secs  Baseline: 41.77 sec.  Goal status: INITIAL  6.  Pt will demonstrate improved fine motor coordination for ADLs as evidenced by decreasing 9 hole peg test score for Rt hand by 3 secs  Baseline: 31.60 sec Goal status: INITIAL  ASSESSMENT:  CLINICAL IMPRESSION: This 10th progress note is for dates 02/08/24 - 03/28/24: Pt has met 4/5 STG's and already has met 3  LTG's. Progressing towards remaining goals.   PERFORMANCE DEFICITS: in functional skills including ADLs, IADLs, coordination, dexterity, proprioception, sensation, ROM, strength, pain, Fine motor control, Gross motor control, hearing, mobility, balance, body mechanics, endurance, decreased knowledge of use of DME, vision, and UE functional use, cognitive skills including memory and safety awareness, and psychosocial skills including coping strategies.   IMPAIRMENTS: are limiting patient from ADLs, IADLs, rest and sleep, leisure, and social participation.   COMORBIDITIES:  has co-morbidities such as recent back surgery with back precautions, orthostatic hypotension, fluctuating BP that affects occupational performance. Patient will benefit from skilled OT to address above impairments and improve overall function.  REHAB POTENTIAL: Good  PLAN:  OT FREQUENCY: 2x/week  OT DURATION: 8 weeks  PLANNED INTERVENTIONS: 97535 self care/ADL training, 02889 therapeutic exercise, 97530 therapeutic activity, 97112 neuromuscular re-education, 97140 manual therapy, 97113 aquatic therapy, 97035 ultrasound, 97010 moist heat, 97129 Cognitive training (first 15 min), 02869 Cognitive training(each additional 15 min), 02249 Physical Performance Testing, passive range of motion, functional mobility training, visual/perceptual remediation/compensation, energy conservation, coping strategies training, patient/family education, and DME and/or AE instructions  RECOMMENDED OTHER SERVICES: will request speech therapy referral but will wait to make speech evaluation until after pt gets new hearing aids  CONSULTED AND AGREED WITH PLAN OF CARE: Patient and family member/caregiver  PLAN FOR NEXT SESSION: monitor BP (fluctuates), check remaining STG, continue coordination tasks in standing if able (flipping cards w/ step pattern), make sure pt got scheduled for week of 04/03/24 for O.T. , Progress towards remaining LTG's  vision  options for phone - [employer] - did he practice with new mag? (Does not prefer [employer]);  Burnard JINNY Roads, OT 03/28/2024, 2:57 PM

## 2024-03-28 NOTE — Therapy (Signed)
 OUTPATIENT PHYSICAL THERAPY NEURO TREATMENT   Patient Name: [name] MRN: [id number] DOB:[birth year], 73 y.o., male Today's Date: 03/28/2024   PCP: [name], MD   REFERRING PROVIDER: Caleen Dirks, MD (sent to Harlene Bogaert, NP)  END OF SESSION:  PT End of Session - 03/28/24 1406     Visit Number 7    Number of Visits 13    Date for PT Re-Evaluation 04/08/24   due to potential delay in scheduling   Authorization Type [employer] Medicare    PT Start Time 1403    PT Stop Time 1443    PT Time Calculation (min) 40 min    Equipment Utilized During Treatment Gait belt    Activity Tolerance Patient tolerated treatment well    Behavior During Therapy WFL for tasks assessed/performed;Flat affect          Past Medical History:  Diagnosis Date   Allergy    seasonal   Anxiety    Arthritis    BPH (benign prostatic hyperplasia)    Complication of anesthesia    Pt. stated he had a reaction that ended in him requiring urinary cath placement; hx delirium worsening parkinson symptoms   Depression    Diverticulosis    GERD (gastroesophageal reflux disease)    esophageal spasms   Glaucoma    Gout    Head injury, closed, with concussion    Hepatitis C    chronic - Has been treated with Harvoni   HLD (hyperlipidemia)    statin intolerant (Crestor  & Simvastatin ) - Taking Livalo  1mg  / week   Hypertension    Neuromuscular disorder (HCC)    Parkinson's Disease   Parkinson's disease (HCC)    Plantar fasciitis    right   PVD (peripheral vascular disease) (HCC)    With no claudication; only mild abdominal aortic atherosclerosis noted on ultrasound.   Sleep apnea    Wears CPAP nightly   Spinal stenosis of lumbar region    Thoracic ascending aortic aneurysm (HCC)    4.2 cm ascending TAA 09/2016 CT, 1 yr f/u rec   Ulcer    Past Surgical History:  Procedure Laterality Date   ANTERIOR LAT LUMBAR FUSION N/A 12/31/2023   Procedure: Prone trans-psoas interbody fusion - Lumbar  three-Lumbar four - Lumbar four-Lumbar five, right facetectomy Lumbar four-five;  Surgeon: [name], MD;  Location: [facility];  Service: Neurosurgery;  Laterality: N/A;   BUNIONECTOMY WITH WEIL OSTEOTOMY Right 11/02/2019   Procedure: Right Foot Lapidus, Modified McBride Bunionectomy,  Hallux Akin Osteotomy;  Surgeon: [name], MD;  Location: [facility];  Service: Orthopedics;  Laterality: Right;   CARDIAC CATHETERIZATION  2005   30% Cx. Dr. Lavon   cataract surgery Left 11/05/2014   COLONOSCOPY     COLONOSCOPY N/A 01/09/2021   Procedure: COLONOSCOPY;  Surgeon: [name], MD;  Location: [facility];  Service: Gastroenterology;  Laterality: N/A;   ESOPHAGOGASTRODUODENOSCOPY N/A 05/02/2015   Procedure: ESOPHAGOGASTRODUODENOSCOPY (EGD);  Surgeon: [name], MD;  Location: [facility];  Service: Endoscopy;  Laterality: N/A;   ESOPHAGOGASTRODUODENOSCOPY  05/02/2015   no source of pt chest pain endoscopically evident. small hiatal hernia.   ESOPHAGOGASTRODUODENOSCOPY (EGD) WITH PROPOFOL  N/A 01/02/2021   Procedure: ESOPHAGOGASTRODUODENOSCOPY (EGD) WITH PROPOFOL ;  Surgeon: [name], MD;  Location: [facility];  Service: Endoscopy;  Laterality: N/A;   HARDWARE REMOVAL Right 11/02/2019   Procedure: Second Metatarsal Removal of Deep Implant and Rotational Osteotomy;  Surgeon: [name], MD;  Location: [facility];  Service: Orthopedics;  Laterality: Right;   IR ANGIOGRAM SELECTIVE EACH ADDITIONAL VESSEL  01/04/2021   IR ANGIOGRAM SELECTIVE EACH ADDITIONAL VESSEL  01/04/2021   IR ANGIOGRAM VISCERAL SELECTIVE  01/04/2021   IR US  GUIDE VASC ACCESS RIGHT  01/04/2021   LUMBAR LAMINECTOMY/DECOMPRESSION MICRODISCECTOMY N/A 07/03/2021   Procedure: Lumbar three through five decompression with lumbar three through five insitu fusion;  Surgeon: [name], MD;  Location: [facility];  Service: Orthopedics;  Laterality: N/A;   LUMBAR PERCUTANEOUS PEDICLE SCREW 2  LEVEL N/A 12/31/2023   Procedure: LUMBAR PERCUTANEOUS PEDICLE SCREW LUMBAR THREE-LUMBAR FIVE;  Surgeon: [name], MD;  Location: [facility];  Service: Neurosurgery;  Laterality: N/A;   MEMBRANE PEEL Left 03/14/2014   Procedure: MEMBRANE PEEL; ENDOLASER;  Surgeon: [name], MD;  Location: [facility];  Service: Ophthalmology;  Laterality: Left;   NM MYOVIEW  LTD  10/2015   LOW RISK. Small, fixed basal lateral defect - likely diaphragmatic attenuation. EF 69%   PARS PLANA VITRECTOMY Left 03/14/2014   Procedure: PARS PLANA VITRECTOMY WITH 25 GAUGE;  Surgeon: [name], MD;  Location: [facility] OR;  Service: Ophthalmology;  Laterality: Left;   shave  02/03/2022   angiofibroma   TONSILLECTOMY     TRANSTHORACIC ECHOCARDIOGRAM  01/07/2021   Normal EF 60 to 65%.  No R WMA.  GRII DD-moderately dilated LA..  Mildly dilated RV but normal function.  Normal RAP/CVP.  Trivial AI with mild to moderate sclerosis-no stenosis   UMBILICAL HERNIA REPAIR  2023   UPPER GASTROINTESTINAL ENDOSCOPY     WEIL OSTEOTOMY Right 09/01/2017   Procedure: RIGHT GREAT TOE CHEVRON AND WEIL OSTEOTOMY 2ND METATARSAL;  Surgeon: [name], MD;  Location: [facility];  Service: Orthopedics;  Laterality: Right;   XI ROBOTIC ASSISTED VENTRAL HERNIA N/A 03/17/2022   Procedure: XI ROBOTIC ASSISTED VENTRAL HERNIA;  Surgeon: [name], MD;  Location: [facility];  Service: General;  Laterality: N/A;   Patient Active Problem List   Diagnosis Date Noted   Chronic hepatitis C (HCC) 03/09/2024   Malnutrition of moderate degree 01/06/2024   Spondylolisthesis at L4-L5 level 12/31/2023   Supine hypertension 12/03/2023   Parkinson's disease with neurogenic orthostatic hypotension (HCC) 12/03/2023   Preop cardiovascular exam 12/03/2023   Glaucoma 11/30/2023   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   Acquired hallux rigidus of right foot 11/23/2022   Ventral hernia without obstruction or gangrene    Primary open angle glaucoma of left eye, mild  stage 06/23/2021   Facial numbness 12/24/2020   Neovascular glaucoma, right eye, stage unspecified 12/16/2020   Lumbar radiculopathy 11/25/2020   Gastroesophageal reflux disease 09/10/2020   Dry eyes, bilateral 08/15/2020   Presbycusis of right ear 01/17/2020   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   Secondary corneal edema of right eye 12/12/2019   Ptosis of right eyelid 12/12/2019   OAB (overactive bladder) 11/29/2019   Bunion of great toe of right foot 07/14/2017   Claw toe, acquired, right 07/14/2017   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   Medication management 10/09/2016   Plantar fasciitis of right foot 02/19/2015   Depression 08/14/2014   Family history of heart disease in male family member before age 29 04/26/2014   Mild cognitive impairment 03/13/2014   Parkinsonian tremor (HCC) 02/12/2014   Hyperlipidemia with target LDL less than 100    Abdominal aortic atherosclerosis (HCC)    Moderate essential hypertension 02/25/2011   Arthropathy 02/25/2011  HAV (hallux abducto valgus) 02/25/2011    ONSET DATE: 01/19/2024  REFERRING DIAG: M43.16 (ICD-10-CM) - Spondylolisthesis, lumbar region  THERAPY DIAG:  Other lack of coordination  Unsteadiness on feet  Muscle weakness (generalized)  Other symptoms and signs involving the nervous system  Rationale for Evaluation and Treatment: Rehabilitation  SUBJECTIVE:                                                                                                                                                                                             SUBJECTIVE STATEMENT: Comes in with self made eye patch over R eye. Pt decided to do this on his own. Reports it is just a blur out of his R eye. Got new glasses last week, struggles reading. Feels like he can't see clearly. Asking about practicing with the walking sticks.   Pt accompanied by:  self  PERTINENT HISTORY: PMH: s/p L3-5 prone trans-psoas approach for lateral interbody fusion; R L4-5 facetectomy and posterior segmental instrumentation L3-5 (12/2023). PMH significant for PD (complicated by tremor, gait impairment, REM sleep behavior and cognitive impairment ), HTN, DM, gout, glaucoma, PVD   Per note from [facility]: He underwent back surgery on 6/6 (L2-5 lateral interbody fusion with posterior instrumentation) with postop confusion and delirium requiring restraints,  improved with use of Seroquel  and discontinuing opioids and muscle relaxants, suspected confusion due to GA, PD and possibly medication side effects.  He was discharged on Seroquel  25/50.  He was discharged to [facility] rehab on 6/18, has not yet started therapies. He continues to have occasional confusion although some what improving per wife, at times with paranoia and visual hallucinations, also has been wandering and currently wearing locator band on ankle   PAIN:  Are you having pain? No and a little soreness from doing the exercises      PRECAUTIONS: Back, Fall, and Other: hx of orthostatics, pt's spouse has to remind him of his precautions    FALLS: Has patient fallen in last 6 months? Yes. Number of falls 1, had a fall getting up and fell back against the window, this happened about a week or 2 before the surgery   LIVING ENVIRONMENT: Lives with: lives with their spouse Lives in: House/apartment Stairs: Yes: Internal: 15 steps; on right going up and External: 2 on the front porch and 3 on the side steps; can reach both, doesn't have to go up indoor steps, can live on the bottom floor  Has following equipment at home: Vannie - 2 wheeled, shower chair, and Grab bars  PLOF: Independent and Leisure: music, traveling Pt's spouse has to remind him of  brace precautions for ADLs   PATIENT GOALS: Wants to be able to be more independent, stronger, and more mobile   OBJECTIVE:  Note: Objective measures  were completed at Evaluation unless otherwise noted.  COGNITION: Overall cognitive status: Impaired   SENSATION: Light touch: WFL Reports numbness/tingling intermittently down front of BLE   COORDINATION: Heel to shin: slightly harder with LLE   POSTURE: rounded shoulders and forward head   LOWER EXTREMITY MMT:    MMT Right Eval Left Eval  Hip flexion 5 4+  Hip extension    Hip abduction 4 4  Hip adduction 4+ 4+  Hip internal rotation    Hip external rotation    Knee flexion 4+ 4+  Knee extension 4 -4  Ankle dorsiflexion 5 5  Ankle plantarflexion    Ankle inversion    Ankle eversion    (Blank rows = not tested)  All tested in sitting   BED MOBILITY:  Pt reports will feel stuck trying to get his legs in and out of the bed. Reviewed the log roll technique for bed mobility (PT demonstrating on the bed)  TRANSFERS: Sit to stand: SBA  Assistive device utilized: None     Stand to sit: SBA  Assistive device utilized: None      Mainly performs with UE support, but can perform without, with intermittent retropulsion  GAIT: Findings: Gait Characteristics: step through pattern, decreased arm swing- Right, decreased arm swing- Left, decreased stride length, decreased ankle dorsiflexion- Right, decreased ankle dorsiflexion- Left, Right foot flat, Left foot flat, shuffling, decreased trunk rotation, and narrow BOS, Distance walked: Clinic distances , Assistive device utilized:None, Level of assistance: SBA and CGA, and Comments: intermittent CGA during turns due to pt with narrow BOS   FUNCTIONAL TESTS:  5 times sit to stand: 21.4 seconds with no UE support, last rep pt with an episode of retropulsion  10 meter walk test: 11.5 seconds with no AD = 2.85 ft/sec     [facility] PT Assessment - 02/08/24 1440       Timed Up and Go Test   Normal TUG (seconds) 11.8    Manual TUG (seconds) 12.8    Cognitive TUG (seconds) 22   attempting retro counting by 3s, pt tends to add instead of  subtract   TUG Comments pt with narrow BOS when turning with intermittent CGA        VITALS  Vitals:   03/28/24 1411 03/28/24 1415  BP: 106/67 (!) 84/57  Pulse: 63 66                                                                                                                                  TREATMENT:  Self-care/home management  Vitals:   03/28/24 1411 03/28/24 1415  BP: 106/67 (!) 84/57  Pulse: 63 66  Sitting, Standing Pt reporting feeling lightheaded in standing, pt wearing compression socks today. Pt positive for orthostatics today and  reviewing what that meant to pt. Deferred standing exercises and gait at start of session today due to low BP and pt having lightheadedness and performed initially in seated/standing   Discussed following up with eye doctor regarding his new glasses as he reports he has some difficulties with his vision and blurred vision.  Pt reports that he left a message for them earlier today. Pt came into session with his R eye patched (pt did this on his own since he reports that he can't really see out of that eye anyways). Educated pt that he should not be patching his eye if it was not advised by a medical professional, pt verbalized understanding and took the patch off.   Re-checked BP after seated/supine exercises in standing with pt reporting no lightheadedness: 102/65   Therapeutic Exercise: Started session in supine due to positive for orthostatics and pt reporting feeling lightheadedness 10 reps bridging with cues for full ROM  10 reps posterior pelvic tilts, progressing to posterior pelvic tilt with supine march 10 reps, needing verbal/tactile cues for technique for proper technique and core activation   Seated step overs over larger orange obstacle: 10 reps each leg, pt frequently hitting obstacle when trying to step over with RLE  NMR: Following multi-directional stepping sheet standing on air ex: Performed 3 reps following the exact  directions with cues needing for coordination when stepping on and off air ex (as pt initially stepping off with both feet instead of just one), progressed the last 2 reps with pt performing the opposite of either R/L for additional cognitive challenge, pt challenged by this and had tendency to get it mixed up or the directions messed up, intermittent CGA/min A for balance   PATIENT EDUCATION: Education details: continue HEP, see self-care section above, discussed will check goals at next session and will add an additional 1x week for 3 weeks in POC to continue to address balance due to some missed visits in POC and session limited today due to orthostatics  Person educated: Patient Education method: Explanation, Demonstration, and Verbal cues Education comprehension: verbalized understanding and returned demonstration   HOME EXERCISE PROGRAM: Standing PWR Moves  Access Code: [email or web address] URL: [email or web address] Date: 03/07/2024 Prepared by: Sheffield Senate  Exercises - Sit to Stand  - 1-2 x daily - 4-5 x weekly - 2 sets - 10 reps  GOALS: Goals reviewed with patient? Yes  SHORT TERM GOALS: Target date: 02/29/2024  Pt will be independent with initial HEP for improved strength, balance, transfers and gait. Baseline: Goal status: INITIAL  2.  miniBEST to be assessed with LTG written.  Baseline: 20/28 Goal status: MET  3.  Pt will improve 5x sit<>stand to less than or equal to 18 sec to demonstrate improved functional strength and transfer efficiency.   Baseline: 21.4 seconds with no UE support, last rep pt with an episode of retropulsion   15.4 seconds with no UE support  Goal status: MET    LONG TERM GOALS: Target date: 03/21/2024 Updated goal date to reflect POC/last appt date: 03/30/24  Pt will verbalize understanding of local PD community resources, including fitness post DC.  Baseline:  Goal status: INITIAL  2.  Pt will improve miniBEST to at least a 23/28  in order to demo decr fall risk.  Baseline: 20/28 Goal status: INITIAL  3.  Pt will perform cog TUG in 18 seconds or less in order to demo improved dual tasking.  Baseline: 22 seconds Goal status: INITIAL  4.  Pt  will improve gait speed with no AD vs. LRAD to at least 3.1 ft/sec in order to demo improved community mobility.   Baseline: 11.5 seconds with no AD = 2.85 ft/sec  10.9 seconds with no AD = 3.0 ft/sec 8/25) Goal status: ON-GOING  5.  Pt will improve 5x sit<>stand to less than or equal to 16 sec to demonstrate improved functional strength and transfer efficiency.  Baseline: 21.4 seconds with no UE support, last rep pt with an episode of retropulsion   14.6 seconds with no UE support (8/25) Goal status: MET   ASSESSMENT:  CLINICAL IMPRESSION: Pt demonstrating positive orthostatics at start of session with pt reporting lightheadedness (see vitals above). So initiated session with core exercises in supine and seated position. Re-assessed afterwards with BP incr and pt not symptomatic, so progressed to standing balance on compliant surfaces working on stepping strategies. Pt needing intermittent support for balance, esp when having to step back onto an unlevel surface. Will check goals at next session, but discussed adding 1x week for 3 weeks in POC to continue to address balance with pt in agreement with plan. Will continue per POC.    OBJECTIVE IMPAIRMENTS: Abnormal gait, decreased activity tolerance, decreased balance, decreased cognition, decreased coordination, decreased endurance, decreased mobility, difficulty walking, decreased strength, decreased safety awareness, hypomobility, impaired sensation, and postural dysfunction.   ACTIVITY LIMITATIONS: carrying, lifting, bending, stairs, transfers, and locomotion level  PARTICIPATION LIMITATIONS: driving, shopping, community activity, and yard work  PERSONAL FACTORS: Age, Behavior pattern, Past/current experiences, Time since  onset of injury/illness/exacerbation, and 3+ comorbidities: s/p L3-5 prone trans-psoas approach for lateral interbody fusion; R L4-5 facetectomy and posterior segmental instrumentation L3-5 (12/2023). PMH significant for PD (complicated by tremor, gait impairment, REM sleep behavior and cognitive impairment ), HTN, DM, gout, glaucoma, PVD, orthostatic hypotension  are also affecting patient's functional outcome.   REHAB POTENTIAL: Good  CLINICAL DECISION MAKING: Evolving/moderate complexity  EVALUATION COMPLEXITY: Moderate  PLAN:  PT FREQUENCY: 2x/week  PT DURATION: 8 weeks - anticipate just 6 weeks   PLANNED INTERVENTIONS: [id number]- PT Re-evaluation, 97750- Physical Performance Testing, 97110-Therapeutic exercises, 97530- Therapeutic activity, 97112- Neuromuscular re-education, 97535- Self Care, [id number]- Manual therapy, [id number]- Gait training, Patient/Family education, Balance training, Stair training, and DME instructions  PLAN FOR NEXT SESSION: check BP!, work on wider BOS and turning tasks, stepping strategies, SLS, also add in core stability exercises after back surgery Cog dual tasking with balance tasks   Goals due at this session, check goals, and discussed at this appt on 9/2, adding an additional 1x week for 3 weeks (as pt missed some visits in POC), would do re-cert to cover those visits to continue working on balance and plan to D/C at that time    [place] Severs N Carliss Porcaro, PT, DPT 03/28/2024, 3:54 PM

## 2024-03-29 ENCOUNTER — Other Ambulatory Visit: Payer: Self-pay

## 2024-03-30 ENCOUNTER — Ambulatory Visit: Admitting: Occupational Therapy

## 2024-03-30 ENCOUNTER — Ambulatory Visit: Admitting: Physical Therapy

## 2024-03-30 VITALS — BP 149/81 | HR 69

## 2024-03-30 DIAGNOSIS — G20A2 Parkinson's disease without dyskinesia, with fluctuations: Secondary | ICD-10-CM | POA: Diagnosis not present

## 2024-03-30 DIAGNOSIS — R278 Other lack of coordination: Secondary | ICD-10-CM | POA: Diagnosis not present

## 2024-03-30 DIAGNOSIS — R29818 Other symptoms and signs involving the nervous system: Secondary | ICD-10-CM | POA: Diagnosis not present

## 2024-03-30 DIAGNOSIS — R2681 Unsteadiness on feet: Secondary | ICD-10-CM

## 2024-03-30 DIAGNOSIS — R251 Tremor, unspecified: Secondary | ICD-10-CM | POA: Diagnosis not present

## 2024-03-30 DIAGNOSIS — R41842 Visuospatial deficit: Secondary | ICD-10-CM

## 2024-03-30 DIAGNOSIS — M6281 Muscle weakness (generalized): Secondary | ICD-10-CM

## 2024-03-30 DIAGNOSIS — R1312 Dysphagia, oropharyngeal phase: Secondary | ICD-10-CM | POA: Diagnosis not present

## 2024-03-30 DIAGNOSIS — R471 Dysarthria and anarthria: Secondary | ICD-10-CM | POA: Diagnosis not present

## 2024-03-30 DIAGNOSIS — M5459 Other low back pain: Secondary | ICD-10-CM

## 2024-03-30 DIAGNOSIS — R293 Abnormal posture: Secondary | ICD-10-CM

## 2024-03-30 NOTE — Therapy (Signed)
 OUTPATIENT PHYSICAL THERAPY NEURO TREATMENT - RECERTIFICATION    Patient Name: [name] MRN: [id number] DOB:[birth year], 73 y.o., male Today's Date: 03/30/2024   PCP: [name], MD   REFERRING PROVIDER: Caleen Dirks, MD (sent to Harlene Bogaert, NP)  END OF SESSION:  PT End of Session - 03/30/24 1103     Visit Number 8    Number of Visits 13    Date for PT Re-Evaluation 04/27/24   Recert   Authorization Type [employer] Medicare    PT Start Time 1101    PT Stop Time 1146    PT Time Calculation (min) 45 min    Equipment Utilized During Treatment Gait belt    Activity Tolerance Patient tolerated treatment well    Behavior During Therapy WFL for tasks assessed/performed;Flat affect          Past Medical History:  Diagnosis Date   Allergy    seasonal   Anxiety    Arthritis    BPH (benign prostatic hyperplasia)    Complication of anesthesia    Pt. stated he had a reaction that ended in him requiring urinary cath placement; hx delirium worsening parkinson symptoms   Depression    Diverticulosis    GERD (gastroesophageal reflux disease)    esophageal spasms   Glaucoma    Gout    Head injury, closed, with concussion    Hepatitis C    chronic - Has been treated with Harvoni   HLD (hyperlipidemia)    statin intolerant (Crestor  & Simvastatin ) - Taking Livalo  1mg  / week   Hypertension    Neuromuscular disorder (HCC)    Parkinson's Disease   Parkinson's disease (HCC)    Plantar fasciitis    right   PVD (peripheral vascular disease) (HCC)    With no claudication; only mild abdominal aortic atherosclerosis noted on ultrasound.   Sleep apnea    Wears CPAP nightly   Spinal stenosis of lumbar region    Thoracic ascending aortic aneurysm (HCC)    4.2 cm ascending TAA 09/2016 CT, 1 yr f/u rec   Ulcer    Past Surgical History:  Procedure Laterality Date   ANTERIOR LAT LUMBAR FUSION N/A 12/31/2023   Procedure: Prone trans-psoas interbody fusion - Lumbar three-Lumbar four -  Lumbar four-Lumbar five, right facetectomy Lumbar four-five;  Surgeon: [name], MD;  Location: [facility];  Service: Neurosurgery;  Laterality: N/A;   BUNIONECTOMY WITH WEIL OSTEOTOMY Right 11/02/2019   Procedure: Right Foot Lapidus, Modified McBride Bunionectomy,  Hallux Akin Osteotomy;  Surgeon: [name], MD;  Location: [facility];  Service: Orthopedics;  Laterality: Right;   CARDIAC CATHETERIZATION  2005   30% Cx. Dr. Lavon   cataract surgery Left 11/05/2014   COLONOSCOPY     COLONOSCOPY N/A 01/09/2021   Procedure: COLONOSCOPY;  Surgeon: [name], MD;  Location: [facility];  Service: Gastroenterology;  Laterality: N/A;   ESOPHAGOGASTRODUODENOSCOPY N/A 05/02/2015   Procedure: ESOPHAGOGASTRODUODENOSCOPY (EGD);  Surgeon: [name], MD;  Location: [facility];  Service: Endoscopy;  Laterality: N/A;   ESOPHAGOGASTRODUODENOSCOPY  05/02/2015   no source of pt chest pain endoscopically evident. small hiatal hernia.   ESOPHAGOGASTRODUODENOSCOPY (EGD) WITH PROPOFOL  N/A 01/02/2021   Procedure: ESOPHAGOGASTRODUODENOSCOPY (EGD) WITH PROPOFOL ;  Surgeon: [name], MD;  Location: [facility];  Service: Endoscopy;  Laterality: N/A;   HARDWARE REMOVAL Right 11/02/2019   Procedure: Second Metatarsal Removal of Deep Implant and Rotational Osteotomy;  Surgeon: [name], MD;  Location: [facility];  Service: Orthopedics;  Laterality: Right;   IR ANGIOGRAM SELECTIVE EACH ADDITIONAL VESSEL  01/04/2021   IR ANGIOGRAM SELECTIVE EACH ADDITIONAL VESSEL  01/04/2021   IR ANGIOGRAM VISCERAL SELECTIVE  01/04/2021   IR US  GUIDE VASC ACCESS RIGHT  01/04/2021   LUMBAR LAMINECTOMY/DECOMPRESSION MICRODISCECTOMY N/A 07/03/2021   Procedure: Lumbar three through five decompression with lumbar three through five insitu fusion;  Surgeon: [name], MD;  Location: [facility] OR;  Service: Orthopedics;  Laterality: N/A;   LUMBAR PERCUTANEOUS PEDICLE SCREW 2 LEVEL N/A 12/31/2023    Procedure: LUMBAR PERCUTANEOUS PEDICLE SCREW LUMBAR THREE-LUMBAR FIVE;  Surgeon: [name], MD;  Location: [facility];  Service: Neurosurgery;  Laterality: N/A;   MEMBRANE PEEL Left 03/14/2014   Procedure: MEMBRANE PEEL; ENDOLASER;  Surgeon: [name], MD;  Location: [facility];  Service: Ophthalmology;  Laterality: Left;   NM MYOVIEW  LTD  10/2015   LOW RISK. Small, fixed basal lateral defect - likely diaphragmatic attenuation. EF 69%   PARS PLANA VITRECTOMY Left 03/14/2014   Procedure: PARS PLANA VITRECTOMY WITH 25 GAUGE;  Surgeon: [name], MD;  Location: [facility] OR;  Service: Ophthalmology;  Laterality: Left;   shave  02/03/2022   angiofibroma   TONSILLECTOMY     TRANSTHORACIC ECHOCARDIOGRAM  01/07/2021   Normal EF 60 to 65%.  No R WMA.  GRII DD-moderately dilated LA..  Mildly dilated RV but normal function.  Normal RAP/CVP.  Trivial AI with mild to moderate sclerosis-no stenosis   UMBILICAL HERNIA REPAIR  2023   UPPER GASTROINTESTINAL ENDOSCOPY     WEIL OSTEOTOMY Right 09/01/2017   Procedure: RIGHT GREAT TOE CHEVRON AND WEIL OSTEOTOMY 2ND METATARSAL;  Surgeon: [name], MD;  Location: [facility];  Service: Orthopedics;  Laterality: Right;   XI ROBOTIC ASSISTED VENTRAL HERNIA N/A 03/17/2022   Procedure: XI ROBOTIC ASSISTED VENTRAL HERNIA;  Surgeon: [name], MD;  Location: [facility];  Service: General;  Laterality: N/A;   Patient Active Problem List   Diagnosis Date Noted   Chronic hepatitis C (HCC) 03/09/2024   Malnutrition of moderate degree 01/06/2024   Spondylolisthesis at L4-L5 level 12/31/2023   Supine hypertension 12/03/2023   Parkinson's disease with neurogenic orthostatic hypotension (HCC) 12/03/2023   Preop cardiovascular exam 12/03/2023   Glaucoma 11/30/2023   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   Acquired hallux rigidus of right foot 11/23/2022   Ventral hernia without obstruction or gangrene    Primary open angle glaucoma of left eye, mild stage 06/23/2021    Facial numbness 12/24/2020   Neovascular glaucoma, right eye, stage unspecified 12/16/2020   Lumbar radiculopathy 11/25/2020   Gastroesophageal reflux disease 09/10/2020   Dry eyes, bilateral 08/15/2020   Presbycusis of right ear 01/17/2020   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   Secondary corneal edema of right eye 12/12/2019   Ptosis of right eyelid 12/12/2019   OAB (overactive bladder) 11/29/2019   Bunion of great toe of right foot 07/14/2017   Claw toe, acquired, right 07/14/2017   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   Medication management 10/09/2016   Plantar fasciitis of right foot 02/19/2015   Depression 08/14/2014   Family history of heart disease in male family member before age 20 04/26/2014   Mild cognitive impairment 03/13/2014   Parkinsonian tremor (HCC) 02/12/2014   Hyperlipidemia with target LDL less than 100    Abdominal aortic atherosclerosis (HCC)    Moderate essential hypertension 02/25/2011   Arthropathy 02/25/2011  HAV (hallux abducto valgus) 02/25/2011    ONSET DATE: 01/19/2024  REFERRING DIAG: M43.16 (ICD-10-CM) - Spondylolisthesis, lumbar region  THERAPY DIAG:  Unsteadiness on feet  Muscle weakness (generalized)  Abnormal posture  Other lack of coordination  Other low back pain  Rationale for Evaluation and Treatment: Rehabilitation  SUBJECTIVE:                                                                                                                                                                                             SUBJECTIVE STATEMENT: Pt reports doing well. Denies falls or acute changes. Had some back pain this morning but is feeling better now. Denies dizziness today.   Pt accompanied by: self  PERTINENT HISTORY: PMH: s/p L3-5 prone trans-psoas approach for lateral interbody fusion; R L4-5 facetectomy and posterior segmental instrumentation L3-5  (12/2023). PMH significant for PD (complicated by tremor, gait impairment, REM sleep behavior and cognitive impairment ), HTN, DM, gout, glaucoma, PVD   Per note from [facility]: He underwent back surgery on 6/6 (L2-5 lateral interbody fusion with posterior instrumentation) with postop confusion and delirium requiring restraints,  improved with use of Seroquel  and discontinuing opioids and muscle relaxants, suspected confusion due to GA, PD and possibly medication side effects.  He was discharged on Seroquel  25/50.  He was discharged to [facility] rehab on 6/18, has not yet started therapies. He continues to have occasional confusion although some what improving per wife, at times with paranoia and visual hallucinations, also has been wandering and currently wearing locator band on ankle   PAIN:  Are you having pain? No and a little soreness from doing the exercises      PRECAUTIONS: Back, Fall, and Other: hx of orthostatics, pt's spouse has to remind him of his precautions    FALLS: Has patient fallen in last 6 months? Yes. Number of falls 1, had a fall getting up and fell back against the window, this happened about a week or 2 before the surgery   LIVING ENVIRONMENT: Lives with: lives with their spouse Lives in: House/apartment Stairs: Yes: Internal: 15 steps; on right going up and External: 2 on the front porch and 3 on the side steps; can reach both, doesn't have to go up indoor steps, can live on the bottom floor  Has following equipment at home: Walker - 2 wheeled, shower chair, and Grab bars  PLOF: Independent and Leisure: music, traveling Pt's spouse has to remind him of brace precautions for ADLs   PATIENT GOALS: Wants to be able to be more independent, stronger, and more mobile   OBJECTIVE:  Note: Objective measures  were completed at Evaluation unless otherwise noted.  COGNITION: Overall cognitive status: Impaired   SENSATION: Light touch: WFL Reports numbness/tingling  intermittently down front of BLE   COORDINATION: Heel to shin: slightly harder with LLE   POSTURE: rounded shoulders and forward head   LOWER EXTREMITY MMT:    MMT Right Eval Left Eval  Hip flexion 5 4+  Hip extension    Hip abduction 4 4  Hip adduction 4+ 4+  Hip internal rotation    Hip external rotation    Knee flexion 4+ 4+  Knee extension 4 -4  Ankle dorsiflexion 5 5  Ankle plantarflexion    Ankle inversion    Ankle eversion    (Blank rows = not tested)  All tested in sitting   BED MOBILITY:  Pt reports will feel stuck trying to get his legs in and out of the bed. Reviewed the log roll technique for bed mobility (PT demonstrating on the bed)  TRANSFERS: Sit to stand: SBA  Assistive device utilized: None     Stand to sit: SBA  Assistive device utilized: None      Mainly performs with UE support, but can perform without, with intermittent retropulsion  GAIT: Findings: Gait Characteristics: step through pattern, decreased arm swing- Right, decreased arm swing- Left, decreased stride length, decreased ankle dorsiflexion- Right, decreased ankle dorsiflexion- Left, Right foot flat, Left foot flat, shuffling, decreased trunk rotation, and narrow BOS, Distance walked: Clinic distances , Assistive device utilized:None, Level of assistance: SBA and CGA, and Comments: intermittent CGA during turns due to pt with narrow BOS   FUNCTIONAL TESTS:  5 times sit to stand: 21.4 seconds with no UE support, last rep pt with an episode of retropulsion  10 meter walk test: 11.5 seconds with no AD = 2.85 ft/sec     [facility] PT Assessment - 03/30/24 1119       Ambulation/Gait   Gait velocity 32.8' over 10.22s = 3.21 ft/s   no AD, mod I     Mini-BESTest   Sit To Stand Normal: Comes to stand without use of hands and stabilizes independently.    Rise to Toes < 3 s.   had to catch pt   Stand on one leg (left) Moderate: < 20 s   3.34s   Stand on one leg (right) Moderate: < 20 s   <1s    Stand on one leg - lowest score 1    Compensatory Stepping Correction - Forward Normal: Recovers independently with a single, large step (second realignement is allowed).    Compensatory Stepping Correction - Backward No step, OR would fall if not caught, OR falls spontaneously.   No step   Compensatory Stepping Correction - Left Lateral Moderate: Several steps to recover equilibrium   2 steps w/crossover   Compensatory Stepping Correction - Right Lateral Moderate: Several steps to recover equilibrium   3 steps w/crossover   Stepping Corredtion Lateral - lowest score 1    Stance - Feet together, eyes open, firm surface  Normal: 30s    Stance - Feet together, eyes closed, foam surface  Normal: 30s   Lateral lean to L   Incline - Eyes Closed Normal: Stands independently 30s and aligns with gravity    Change in Gait Speed Moderate: Unable to change walking speed or signs of imbalance    Walk with head turns - Horizontal Normal: performs head turns with no change in gait speed and good balance    Walk with pivot  turns Moderate:Turns with feet close SLOW (>4 steps) with good balance.    Step over obstacles Normal: Able to step over box with minimal change of gait speed and with good balance.    Timed UP & GO with Dual Task Moderate: Dual Task affects either counting OR walking (>10%) when compared to the TUG without Dual Task.    Mini-BEST total score 19      Timed Up and Go Test   Normal TUG (seconds) 10.28    Cognitive TUG (seconds) 21.4   retro counting by 3's, cued pt to sit down          VITALS  Vitals:   03/30/24 1105 03/30/24 1116  BP: (!) 153/93 (!) 149/81 Comment: Following SciFit  Pulse: (!) 57 69                                                                                                                                   TREATMENT:  Self-care/home management  Assessed vitals in LUE in seated position (see above) and BP more elevated today. That thing has a mind of its  own. Pt denied lightheadedness/dizziness today.   Ther Act  SciFit multi-peaks level 8.5 for 8 minutes using BUE/BLEs for neural priming for reciprocal movement, dynamic cardiovascular warmup and increased amplitude of stepping. RPE of 6/10 following activity. Reassessed vitals following activity (see above) and BP did reduce some but still WNL.   Physical Performance - LTG assessment   [facility] PT Assessment - 03/30/24 1119       Ambulation/Gait   Gait velocity 32.8' over 10.22s = 3.21 ft/s   no AD, mod I     Mini-BESTest   Sit To Stand Normal: Comes to stand without use of hands and stabilizes independently.    Rise to Toes < 3 s.   had to catch pt   Stand on one leg (left) Moderate: < 20 s   3.34s   Stand on one leg (right) Moderate: < 20 s   <1s   Stand on one leg - lowest score 1    Compensatory Stepping Correction - Forward Normal: Recovers independently with a single, large step (second realignement is allowed).    Compensatory Stepping Correction - Backward No step, OR would fall if not caught, OR falls spontaneously.   No step   Compensatory Stepping Correction - Left Lateral Moderate: Several steps to recover equilibrium   2 steps w/crossover   Compensatory Stepping Correction - Right Lateral Moderate: Several steps to recover equilibrium   3 steps w/crossover   Stepping Corredtion Lateral - lowest score 1    Stance - Feet together, eyes open, firm surface  Normal: 30s    Stance - Feet together, eyes closed, foam surface  Normal: 30s   Lateral lean to L   Incline - Eyes Closed Normal: Stands independently 30s and aligns with gravity    Change in Gait Speed Moderate:  Unable to change walking speed or signs of imbalance    Walk with head turns - Horizontal Normal: performs head turns with no change in gait speed and good balance    Walk with pivot turns Moderate:Turns with feet close SLOW (>4 steps) with good balance.    Step over obstacles Normal: Able to step over box with minimal  change of gait speed and with good balance.    Timed UP & GO with Dual Task Moderate: Dual Task affects either counting OR walking (>10%) when compared to the TUG without Dual Task.    Mini-BEST total score 19      Timed Up and Go Test   Normal TUG (seconds) 10.28    Cognitive TUG (seconds) 21.4   retro counting by 3's, cued pt to sit down         Discussed results of goals and informed pt he continues to be challenged w/anterior weight shifting, reactive balance strategies and cog dual-tasking. Pt has scored within 1 point on multiple MiniBest assessments, so pt likely at baseline function. Will continue to work on stepping strategies, anterior weight shifting and dual-tasking in PT. Pt verbalized understanding.     PATIENT EDUCATION: Education details: continue HEP, goal results  Person educated: Patient Education method: Explanation Education comprehension: verbalized understanding and needs further education   HOME EXERCISE PROGRAM: Standing PWR Moves  Access Code: [email or web address] URL: [email or web address] Date: 03/07/2024 Prepared by: Sheffield Senate  Exercises - Sit to Stand  - 1-2 x daily - 4-5 x weekly - 2 sets - 10 reps  GOALS: Goals reviewed with patient? Yes  SHORT TERM GOALS: Target date: 02/29/2024  Pt will be independent with initial HEP for improved strength, balance, transfers and gait. Baseline: Goal status: INITIAL  2.  miniBEST to be assessed with LTG written.  Baseline: 20/28 Goal status: MET  3.  Pt will improve 5x sit<>stand to less than or equal to 18 sec to demonstrate improved functional strength and transfer efficiency.   Baseline: 21.4 seconds with no UE support, last rep pt with an episode of retropulsion   15.4 seconds with no UE support  Goal status: MET    LONG TERM GOALS: Target date: 03/21/2024 Updated goal date to reflect POC/last appt date: 03/30/24  Pt will verbalize understanding of local PD community resources,  including fitness post DC.  Baseline:  Goal status: IN PROGRESS  2.  Pt will improve miniBEST to at least a 23/28 in order to demo decr fall risk.  Baseline: 20/28; 19/28 (9/4) Goal status: NOT MET   3.  Pt will perform cog TUG in 18 seconds or less in order to demo improved dual tasking.  Baseline: 22 seconds; 21.4s (9/4) Goal status: IN PROGRESS   4.  Pt will improve gait speed with no AD vs. LRAD to at least 3.1 ft/sec in order to demo improved community mobility.   Baseline: 11.5 seconds with no AD = 2.85 ft/sec  10.9 seconds with no AD = 3.0 ft/sec (8/25) 10.22s w/no AD = 3.21 ft/s no AD (9/4)  Goal status: MET   5.  Pt will improve 5x sit<>stand to less than or equal to 16 sec to demonstrate improved functional strength and transfer efficiency.  Baseline: 21.4 seconds with no UE support, last rep pt with an episode of retropulsion   14.6 seconds with no UE support (8/25) Goal status: MET   NEW LONG TERM GOALS:  Target date: 04/27/2024  Pt will verbalize understanding of local  PD community resources, including fitness post DC.  Baseline:  Goal status: IN PROGRESS  2.   Pt will perform cog TUG in 18 seconds or less in order to demo improved dual tasking.  Baseline: 22 seconds; 21.4s (9/4) Goal status: IN PROGRESS    ASSESSMENT:  CLINICAL IMPRESSION: Emphasis of skilled PT session on LTG assessment and recertification. Pt has met 2 of 5 LTGs, improving his gait speed to 3.2 ft/s without AD and improving his time on 5x STS. Pt did not meet his MiniBest goal, scoring a 19/28 today which is the same as his score on eval. Pt continues to progress towards his cog-dual task goal, improving him time on cog TUG today but not quite to goal level. Pt will continue to benefit from skilled PT services to work on reactive balance strategies, anterior weight shifting and cog dual-tasking for reduced fall risk.  Will continue per POC.    OBJECTIVE IMPAIRMENTS: Abnormal gait, decreased  activity tolerance, decreased balance, decreased cognition, decreased coordination, decreased endurance, decreased mobility, difficulty walking, decreased strength, decreased safety awareness, hypomobility, impaired sensation, and postural dysfunction.   ACTIVITY LIMITATIONS: carrying, lifting, bending, stairs, transfers, and locomotion level  PARTICIPATION LIMITATIONS: driving, shopping, community activity, and yard work  PERSONAL FACTORS: Age, Behavior pattern, Past/current experiences, Time since onset of injury/illness/exacerbation, and 3+ comorbidities: s/p L3-5 prone trans-psoas approach for lateral interbody fusion; R L4-5 facetectomy and posterior segmental instrumentation L3-5 (12/2023). PMH significant for PD (complicated by tremor, gait impairment, REM sleep behavior and cognitive impairment ), HTN, DM, gout, glaucoma, PVD, orthostatic hypotension  are also affecting patient's functional outcome.   REHAB POTENTIAL: Good  CLINICAL DECISION MAKING: Evolving/moderate complexity  EVALUATION COMPLEXITY: Moderate  PLAN:  PT FREQUENCY: 2x/week  PT DURATION: 8 weeks - anticipate just 6 weeks   PLANNED INTERVENTIONS: 97164- PT Re-evaluation, 97750- Physical Performance Testing, 97110-Therapeutic exercises, 97530- Therapeutic activity, 97112- Neuromuscular re-education, 97535- Self Care, [id number]- Manual therapy, [phone]- Gait training, Patient/Family education, Balance training, Stair training, and DME instructions  PLAN FOR NEXT SESSION: check BP!, work on wider BOS and turning tasks, stepping strategies, SLS, also add in core stability exercises after back surgery Cog dual tasking with balance tasks, toe walking   Yareth Macdonnell E Geneva Barrero, PT, DPT 03/30/2024, 11:53 AM

## 2024-03-30 NOTE — Therapy (Signed)
 OUTPATIENT OCCUPATIONAL THERAPY PARKINSON'S TREATMENT  Patient Name: [name] MRN: [id number] DOB:Jul 07, 1951, 73 y.o., male Today's Date: 03/30/2024  PCP: [name], MD REFERRING PROVIDER: Caleen Dirks, MD  END OF SESSION:  OT End of Session - 03/30/24 1743     Visit Number 11    Number of Visits 16    Date for OT Re-Evaluation 04/10/24    Authorization Type [employer] 2025 - Auth required    Progress Note Due on Visit 20    OT Start Time 1149    OT Stop Time 1230    OT Time Calculation (min) 41 min    Activity Tolerance Patient tolerated treatment well    Behavior During Therapy WFL for tasks assessed/performed          Past Medical History:  Diagnosis Date   Allergy    seasonal   Anxiety    Arthritis    BPH (benign prostatic hyperplasia)    Complication of anesthesia    Pt. stated he had a reaction that ended in him requiring urinary cath placement; hx delirium worsening parkinson symptoms   Depression    Diverticulosis    GERD (gastroesophageal reflux disease)    esophageal spasms   Glaucoma    Gout    Head injury, closed, with concussion    Hepatitis C    chronic - Has been treated with Harvoni   HLD (hyperlipidemia)    statin intolerant (Crestor  & Simvastatin ) - Taking Livalo  1mg  / week   Hypertension    Neuromuscular disorder (HCC)    Parkinson's Disease   Parkinson's disease (HCC)    Plantar fasciitis    right   PVD (peripheral vascular disease) (HCC)    With no claudication; only mild abdominal aortic atherosclerosis noted on ultrasound.   Sleep apnea    Wears CPAP nightly   Spinal stenosis of lumbar region    Thoracic ascending aortic aneurysm (HCC)    4.2 cm ascending TAA 09/2016 CT, 1 yr f/u rec   Ulcer    Past Surgical History:  Procedure Laterality Date   ANTERIOR LAT LUMBAR FUSION N/A 12/31/2023   Procedure: Prone trans-psoas interbody fusion - Lumbar three-Lumbar four - Lumbar four-Lumbar five, right facetectomy Lumbar  four-five;  Surgeon: [name], MD;  Location: [facility];  Service: Neurosurgery;  Laterality: N/A;   BUNIONECTOMY WITH WEIL OSTEOTOMY Right 11/02/2019   Procedure: Right Foot Lapidus, Modified McBride Bunionectomy,  Hallux Akin Osteotomy;  Surgeon: [name], MD;  Location: [facility];  Service: Orthopedics;  Laterality: Right;   CARDIAC CATHETERIZATION  2005   30% Cx. Dr. Lavon   cataract surgery Left 11/05/2014   COLONOSCOPY     COLONOSCOPY N/A 01/09/2021   Procedure: COLONOSCOPY;  Surgeon: [name], MD;  Location: [facility];  Service: Gastroenterology;  Laterality: N/A;   ESOPHAGOGASTRODUODENOSCOPY N/A 05/02/2015   Procedure: ESOPHAGOGASTRODUODENOSCOPY (EGD);  Surgeon: [name], MD;  Location: [facility];  Service: Endoscopy;  Laterality: N/A;   ESOPHAGOGASTRODUODENOSCOPY  05/02/2015   no source of pt chest pain endoscopically evident. small hiatal hernia.   ESOPHAGOGASTRODUODENOSCOPY (EGD) WITH PROPOFOL  N/A 01/02/2021   Procedure: ESOPHAGOGASTRODUODENOSCOPY (EGD) WITH PROPOFOL ;  Surgeon: [name], MD;  Location: [facility];  Service: Endoscopy;  Laterality: N/A;   HARDWARE REMOVAL Right 11/02/2019   Procedure: Second Metatarsal Removal of Deep Implant and Rotational Osteotomy;  Surgeon: [name], MD;  Location: [facility];  Service: Orthopedics;  Laterality: Right;   IR ANGIOGRAM SELECTIVE EACH  ADDITIONAL VESSEL  01/04/2021   IR ANGIOGRAM SELECTIVE EACH ADDITIONAL VESSEL  01/04/2021   IR ANGIOGRAM VISCERAL SELECTIVE  01/04/2021   IR US  GUIDE VASC ACCESS RIGHT  01/04/2021   LUMBAR LAMINECTOMY/DECOMPRESSION MICRODISCECTOMY N/A 07/03/2021   Procedure: Lumbar three through five decompression with lumbar three through five insitu fusion;  Surgeon: [name], MD;  Location: [facility] OR;  Service: Orthopedics;  Laterality: N/A;   LUMBAR PERCUTANEOUS PEDICLE SCREW 2 LEVEL N/A 12/31/2023   Procedure: LUMBAR PERCUTANEOUS PEDICLE SCREW  LUMBAR THREE-LUMBAR FIVE;  Surgeon: [name], MD;  Location: [facility];  Service: Neurosurgery;  Laterality: N/A;   MEMBRANE PEEL Left 03/14/2014   Procedure: MEMBRANE PEEL; ENDOLASER;  Surgeon: [name], MD;  Location: [facility];  Service: Ophthalmology;  Laterality: Left;   NM MYOVIEW  LTD  10/2015   LOW RISK. Small, fixed basal lateral defect - likely diaphragmatic attenuation. EF 69%   PARS PLANA VITRECTOMY Left 03/14/2014   Procedure: PARS PLANA VITRECTOMY WITH 25 GAUGE;  Surgeon: [name], MD;  Location: [facility] OR;  Service: Ophthalmology;  Laterality: Left;   shave  02/03/2022   angiofibroma   TONSILLECTOMY     TRANSTHORACIC ECHOCARDIOGRAM  01/07/2021   Normal EF 60 to 65%.  No R WMA.  GRII DD-moderately dilated LA..  Mildly dilated RV but normal function.  Normal RAP/CVP.  Trivial AI with mild to moderate sclerosis-no stenosis   UMBILICAL HERNIA REPAIR  2023   UPPER GASTROINTESTINAL ENDOSCOPY     WEIL OSTEOTOMY Right 09/01/2017   Procedure: RIGHT GREAT TOE CHEVRON AND WEIL OSTEOTOMY 2ND METATARSAL;  Surgeon: [name], MD;  Location: [facility];  Service: Orthopedics;  Laterality: Right;   XI ROBOTIC ASSISTED VENTRAL HERNIA N/A 03/17/2022   Procedure: XI ROBOTIC ASSISTED VENTRAL HERNIA;  Surgeon: [name], MD;  Location: [facility];  Service: General;  Laterality: N/A;   Patient Active Problem List   Diagnosis Date Noted   Chronic hepatitis C (HCC) 03/09/2024   Malnutrition of moderate degree 01/06/2024   Spondylolisthesis at L4-L5 level 12/31/2023   Supine hypertension 12/03/2023   Parkinson's disease with neurogenic orthostatic hypotension (HCC) 12/03/2023   Preop cardiovascular exam 12/03/2023   Glaucoma 11/30/2023   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   Acquired hallux rigidus of right foot 11/23/2022   Ventral hernia without obstruction or gangrene    Primary open angle glaucoma of left eye, mild stage 06/23/2021   Facial numbness 12/24/2020   Neovascular  glaucoma, right eye, stage unspecified 12/16/2020   Lumbar radiculopathy 11/25/2020   Gastroesophageal reflux disease 09/10/2020   Dry eyes, bilateral 08/15/2020   Presbycusis of right ear 01/17/2020   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   Secondary corneal edema of right eye 12/12/2019   Ptosis of right eyelid 12/12/2019   OAB (overactive bladder) 11/29/2019   Bunion of great toe of right foot 07/14/2017   Claw toe, acquired, right 07/14/2017   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   Medication management 10/09/2016   Plantar fasciitis of right foot 02/19/2015   Depression 08/14/2014   Family history of heart disease in male family member before age 40 04/26/2014   Mild cognitive impairment 03/13/2014   Parkinsonian tremor (HCC) 02/12/2014   Hyperlipidemia with target LDL less than 100    Abdominal aortic atherosclerosis (HCC)    Moderate essential hypertension 02/25/2011   Arthropathy 02/25/2011   HAV (hallux abducto valgus) 02/25/2011   ONSET DATE: 01/21/2024 (referral  date)   REFERRING DIAG: M43.16 (ICD-10-CM) - Spondylolisthesis, lumbar region  THERAPY DIAG:  Other lack of coordination  Visuospatial deficit  Other symptoms and signs involving the nervous system  Parkinson's disease without dyskinesia, with fluctuating manifestations (HCC)  Muscle weakness (generalized)  Rationale for Evaluation and Treatment: Rehabilitation  SUBJECTIVE:   SUBJECTIVE STATEMENT: Pt reported no pain today. He has currently been working on improving his balance and endurance during activities at home.  Pt accompanied by: self  PERTINENT HISTORY: recent back surgery (L3-5) on 12/31/23, Parkinson's disease causing autonomic dysregulation and orthostatic hypotension, leading to blood pressure fluctuations and dizziness upon standing. Lumbar radiculopathy (Chronic), PVD, HLD, spinal stenosis, BPH   Post-surgical  recovery complicated by inappropriate medication management and inadequate rehabilitation, with delirium post-anesthesia likely exacerbated by Parkinson's disease.    PRECAUTIONS: Other: can take off back brace when seated or in bed and some walking in house, but wear it mostly when up. Back precautions (no BLT), no lifting over 10 lbs, orthostatic hypotension   WEIGHT BEARING RESTRICTIONS: No  PAIN:  Are you having pain? No pain at today's session  FALLS: Has patient fallen in last 6 months? Yes. Number of falls 1  LIVING ENVIRONMENT: Lives with: lives with their spouse Lives in: 2 story home, but lives on first floor, 3 steps to enter Has following equipment at home: Vannie - 2 wheeled, shower chair, and Grab bars  PLOF: Needs assistance with ADLs prior to surgery, enjoys music and traveling  PATIENT GOALS: improve function and speed with ADLS including handwriting  OBJECTIVE:  Note: Objective measures were completed at Evaluation unless otherwise noted.  BP assessed and [facility] for therapy visit.   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  MOBILITY STATUS: Independent  POSTURE COMMENTS:  increased lumbar lordosis, increased thoracic kyphosis, and posterior pelvic tilt   FUNCTIONAL OUTCOME MEASURES: Fastening/unfastening 3 buttons: 53 sec Physical performance test: PPT#2 (simulated eating) 27.21 &  PPT#4 (donning/doffing jacket): 14.12 sec (w/ hospital gown)   COORDINATION: 9 Hole Peg test: Right: 31.60 sec; Left: 41.77 sec Box and Blocks:  Right: 37 blocks; Left: 37 blocks  9 Hole Peg test: 03/30/2024 Right: 34.1, 30.2, 31.4 (all trials with glasses)  Average: 31.9  Left: 41.5 sec (without glasses)  38.6 sec (with glasses), 37.9 (with glasses) Average: 39.3     UE ROM:  WFL   SENSATION: Fingertips get cold   COGNITION: Overall cognitive status: Impaired and decreased memory, processing speed  VISION:  Pt/family reports can only see light and images from Rt eye (? Legally blind), glaucoma and decreased vision Lt eye  OBSERVATIONS: Bradykinesia, Hypokinesia, and decline in cognition and overall function since back surgery  TREATMENT:  - Therapeutic activities completed for duration as noted below including:  Patient engaged in an activity that promoted BUE coordination, fine motor skills, and standing balance/ tolerance. He was able to stand for 10 minutes with CGA while sorting different colored pegs into categories of the same color requiring minimal verbal cueing for sequencing of hand alternation as well as color coordination. Pt also engaged in gross motor activity by reaching to sort cones by color incorporating larger amplitude movements as needed for ADL's and IADL's.  -Pt engaged in completion of 9 hole peg testing with instructions for peg placement requiring moderate verbal cueing for sequencing of steps during the test. OT educated pt on tremor reduction strategies with activity including bringing items closer to his body or bringing his chair closer to the table when reaching/completing fine motor task.   - Self-care/home management completed for duration as noted below including:  OT educated pt on proper focal point of glasses by placing markers on  the patients glasses and having him read paper instructions. Additionally therapist had pt look above red line to read the sign on the wall (distance vision)  and below line for near vision.  PATIENT EDUCATION: Education details: see above Person educated: Patient Education method: Explanation, Demonstration, and Verbal cues Education comprehension: verbalized understanding, returned demonstration, and verbal cues required   HOME EXERCISE PROGRAM: 02/17/2024: handwriting changes 02/24/2024: new behind head bag exercise handout 03/07/2024: Exercise Chart 03/14/2024: golf solitaire; coordination HEP 03/16/2024: Buttoning and jacket strategies 03/20/24: tremor strategies 03/22/24: ways to prevent future PD related complications and memory strategies 03/28/24: ways to keep thinking skills sharp  GOALS: Goals reviewed with patient? Yes  SHORT TERM GOALS: Target date: 03/10/24  Independent with PD specific HEP (Will need to adapt d/t current back precautions, consider adding bag ex's for ADLS) Baseline: Goal status: MET  2.  Pt to write name with 90% or greater legibility with only min micrographia Baseline:  Goal status: MET   3. Pt will demonstrate improved ease with feeding as evidenced by decreasing PPT#2 by 8 secs or more Baseline: 27.21 sec Goal status: MET (03/20/24: 16 sec. Using white bowl d/t visual deficits)  4.  Pt will demonstrate improved ease with fastening buttons as evidenced by decreasing 3 button/unbutton time by 5 seconds or more Baseline: 53 sec Goal status: MET (03/20/24 = 32.46 sec w/ shirt ON d/t decreased vision)   5.  Pt will be able to place at least 3 additional blocks bilaterally with completion of Box and Blocks test. Baseline: Right: 37 blocks; Left: 37 blocks Goal status: INITIAL   LONG TERM GOALS: Target date: 04/10/24  Pt will verbalize understanding of ways to prevent future PD related complications and appropriate community resources prn  Baseline:   Goal status: MET  2.  Pt will verbalize understanding of ways to keep thinking skills sharp and ways to compensate for STM changes in the future  Baseline:  Goal status: MET  3.  Pt will verbalize understanding of adaptive strategies to increase ease with ADLS/IADLS   Baseline:  Goal status: MET  4.  Pt will write a sentence with no significant decrease in size and maintain 90% legibility  Baseline:  Goal status: INITIAL  5.  Pt will demonstrate improved fine motor coordination for ADLs as evidenced by decreasing 9 hole peg test score for Lt hand by 6 secs  Baseline: 41.77 sec.  Goal status: INITIAL  6.  Pt will demonstrate improved fine motor coordination for  ADLs as evidenced by decreasing 9 hole peg test score for Rt hand by 3 secs  Baseline: 31.60 sec Goal status: INITIAL  ASSESSMENT:  CLINICAL IMPRESSION: Pt presents with decreased fine motor and coordination skills as evidence to still scoring below functional limits in the 9-hole peg test. He demonstrates good rehab potential as he has improved on his standing balance and tolerance when engaging in tabletop activities . Pt will continue to benefit from skilled outpatient OT to improve functional independence in ADL's and IADL's.  PERFORMANCE DEFICITS: in functional skills including ADLs, IADLs, coordination, dexterity, proprioception, sensation, ROM, strength, pain, Fine motor control, Gross motor control, hearing, mobility, balance, body mechanics, endurance, decreased knowledge of use of DME, vision, and UE functional use, cognitive skills including memory and safety awareness, and psychosocial skills including coping strategies.   IMPAIRMENTS: are limiting patient from ADLs, IADLs, rest and sleep, leisure, and social participation.   COMORBIDITIES:  has co-morbidities such as recent back surgery with back precautions, orthostatic hypotension, fluctuating BP that affects occupational performance. Patient will benefit from  skilled OT to address above impairments and improve overall function.  REHAB POTENTIAL: Good  PLAN:  OT FREQUENCY: 2x/week  OT DURATION: 8 weeks  PLANNED INTERVENTIONS: 97535 self care/ADL training, 02889 therapeutic exercise, 97530 therapeutic activity, 97112 neuromuscular re-education, 97140 manual therapy, 97113 aquatic therapy, 97035 ultrasound, 97010 moist heat, 97129 Cognitive training (first 15 min), 02869 Cognitive training(each additional 15 min), 02249 Physical Performance Testing, passive range of motion, functional mobility training, visual/perceptual remediation/compensation, energy conservation, coping strategies training, patient/family education, and DME and/or AE instructions  RECOMMENDED OTHER SERVICES: will request speech therapy referral but will wait to make speech evaluation until after pt gets new hearing aids  CONSULTED AND AGREED WITH PLAN OF CARE: Patient  PLAN FOR NEXT SESSION:  -monitor BP (fluctuates) -check remaining STG ( box and blocks) -continue coordination tasks in standing if able  -Progress towards remaining LTG's ( writing activity, 9 hole peg) - could also benefit from some vision activities  -How is reading going with glasses?   Colm Lyford, Student-OT 03/30/2024, 5:46 PM

## 2024-04-03 ENCOUNTER — Encounter: Payer: Self-pay | Admitting: Occupational Therapy

## 2024-04-03 ENCOUNTER — Ambulatory Visit: Admitting: Occupational Therapy

## 2024-04-03 DIAGNOSIS — M6281 Muscle weakness (generalized): Secondary | ICD-10-CM

## 2024-04-03 DIAGNOSIS — R471 Dysarthria and anarthria: Secondary | ICD-10-CM | POA: Diagnosis not present

## 2024-04-03 DIAGNOSIS — M5459 Other low back pain: Secondary | ICD-10-CM | POA: Diagnosis not present

## 2024-04-03 DIAGNOSIS — G20A2 Parkinson's disease without dyskinesia, with fluctuations: Secondary | ICD-10-CM | POA: Diagnosis not present

## 2024-04-03 DIAGNOSIS — R2681 Unsteadiness on feet: Secondary | ICD-10-CM | POA: Diagnosis not present

## 2024-04-03 DIAGNOSIS — R278 Other lack of coordination: Secondary | ICD-10-CM

## 2024-04-03 DIAGNOSIS — R29818 Other symptoms and signs involving the nervous system: Secondary | ICD-10-CM | POA: Diagnosis not present

## 2024-04-03 DIAGNOSIS — R251 Tremor, unspecified: Secondary | ICD-10-CM | POA: Diagnosis not present

## 2024-04-03 DIAGNOSIS — R1312 Dysphagia, oropharyngeal phase: Secondary | ICD-10-CM | POA: Diagnosis not present

## 2024-04-03 DIAGNOSIS — R293 Abnormal posture: Secondary | ICD-10-CM | POA: Diagnosis not present

## 2024-04-03 DIAGNOSIS — R41842 Visuospatial deficit: Secondary | ICD-10-CM

## 2024-04-03 NOTE — Therapy (Signed)
 OUTPATIENT OCCUPATIONAL THERAPY PARKINSON'S TREATMENT  Patient Name: [name] MRN: [id number] DOB:[birth year], 73 y.o., male Today's Date: 04/03/2024  PCP: [name], MD REFERRING PROVIDER: Caleen Dirks, MD  END OF SESSION:  OT End of Session - 04/03/24 1058     Visit Number 12    Number of Visits 16    Date for OT Re-Evaluation 04/10/24    Authorization Type [employer] 2025 - Auth required    Authorization Time Period Auth# [id number] 09/28/23 to 11/09/23 [facility] [employer] 2025    Progress Note Due on Visit 20    OT Start Time 1058    OT Stop Time 1147    OT Time Calculation (min) 49 min    Activity Tolerance Patient tolerated treatment well    Behavior During Therapy WFL for tasks assessed/performed          Past Medical History:  Diagnosis Date   Allergy    seasonal   Anxiety    Arthritis    BPH (benign prostatic hyperplasia)    Complication of anesthesia    Pt. stated he had a reaction that ended in him requiring urinary cath placement; hx delirium worsening parkinson symptoms   Depression    Diverticulosis    GERD (gastroesophageal reflux disease)    esophageal spasms   Glaucoma    Gout    Head injury, closed, with concussion    Hepatitis C    chronic - Has been treated with Harvoni   HLD (hyperlipidemia)    statin intolerant (Crestor  & Simvastatin ) - Taking Livalo  1mg  / week   Hypertension    Neuromuscular disorder (HCC)    Parkinson's Disease   Parkinson's disease (HCC)    Plantar fasciitis    right   PVD (peripheral vascular disease) (HCC)    With no claudication; only mild abdominal aortic atherosclerosis noted on ultrasound.   Sleep apnea    Wears CPAP nightly   Spinal stenosis of lumbar region    Thoracic ascending aortic aneurysm (HCC)    4.2 cm ascending TAA 09/2016 CT, 1 yr f/u rec   Ulcer    Past Surgical History:  Procedure Laterality Date   ANTERIOR LAT LUMBAR FUSION N/A 12/31/2023   Procedure: Prone trans-psoas interbody  fusion - Lumbar three-Lumbar four - Lumbar four-Lumbar five, right facetectomy Lumbar four-five;  Surgeon: [name], MD;  Location: [facility];  Service: Neurosurgery;  Laterality: N/A;   BUNIONECTOMY WITH WEIL OSTEOTOMY Right 11/02/2019   Procedure: Right Foot Lapidus, Modified McBride Bunionectomy,  Hallux Akin Osteotomy;  Surgeon: [name], MD;  Location: [facility];  Service: Orthopedics;  Laterality: Right;   CARDIAC CATHETERIZATION  2005   30% Cx. Dr. Lavon   cataract surgery Left 11/05/2014   COLONOSCOPY     COLONOSCOPY N/A 01/09/2021   Procedure: COLONOSCOPY;  Surgeon: [name], MD;  Location: [facility];  Service: Gastroenterology;  Laterality: N/A;   ESOPHAGOGASTRODUODENOSCOPY N/A 05/02/2015   Procedure: ESOPHAGOGASTRODUODENOSCOPY (EGD);  Surgeon: [name], MD;  Location: [facility];  Service: Endoscopy;  Laterality: N/A;   ESOPHAGOGASTRODUODENOSCOPY  05/02/2015   no source of pt chest pain endoscopically evident. small hiatal hernia.   ESOPHAGOGASTRODUODENOSCOPY (EGD) WITH PROPOFOL  N/A 01/02/2021   Procedure: ESOPHAGOGASTRODUODENOSCOPY (EGD) WITH PROPOFOL ;  Surgeon: [name], MD;  Location: [facility];  Service: Endoscopy;  Laterality: N/A;   HARDWARE REMOVAL Right 11/02/2019   Procedure: Second Metatarsal Removal of Deep Implant and Rotational Osteotomy;  Surgeon: [name], MD;  Location: [facility];  Service: Orthopedics;  Laterality: Right;   IR ANGIOGRAM SELECTIVE EACH ADDITIONAL VESSEL  01/04/2021   IR ANGIOGRAM SELECTIVE EACH ADDITIONAL VESSEL  01/04/2021   IR ANGIOGRAM VISCERAL SELECTIVE  01/04/2021   IR US  GUIDE VASC ACCESS RIGHT  01/04/2021   LUMBAR LAMINECTOMY/DECOMPRESSION MICRODISCECTOMY N/A 07/03/2021   Procedure: Lumbar three through five decompression with lumbar three through five insitu fusion;  Surgeon: [name], MD;  Location: [facility] OR;  Service: Orthopedics;  Laterality: N/A;   LUMBAR PERCUTANEOUS  PEDICLE SCREW 2 LEVEL N/A 12/31/2023   Procedure: LUMBAR PERCUTANEOUS PEDICLE SCREW LUMBAR THREE-LUMBAR FIVE;  Surgeon: [name], MD;  Location: [facility];  Service: Neurosurgery;  Laterality: N/A;   MEMBRANE PEEL Left 03/14/2014   Procedure: MEMBRANE PEEL; ENDOLASER;  Surgeon: [name], MD;  Location: [facility];  Service: Ophthalmology;  Laterality: Left;   NM MYOVIEW  LTD  10/2015   LOW RISK. Small, fixed basal lateral defect - likely diaphragmatic attenuation. EF 69%   PARS PLANA VITRECTOMY Left 03/14/2014   Procedure: PARS PLANA VITRECTOMY WITH 25 GAUGE;  Surgeon: [name], MD;  Location[facility] OR;  Service: Ophthalmology;  Laterality: Left;   shave  02/03/2022   angiofibroma   TONSILLECTOMY     TRANSTHORACIC ECHOCARDIOGRAM  01/07/2021   Normal EF 60 to 65%.  No R WMA.  GRII DD-moderately dilated LA..  Mildly dilated RV but normal function.  Normal RAP/CVP.  Trivial AI with mild to moderate sclerosis-no stenosis   UMBILICAL HERNIA REPAIR  2023   UPPER GASTROINTESTINAL ENDOSCOPY     WEIL OSTEOTOMY Right 09/01/2017   Procedure: RIGHT GREAT TOE CHEVRON AND WEIL OSTEOTOMY 2ND METATARSAL;  Surgeon: [name], MD;  Location: [facility];  Service: Orthopedics;  Laterality: Right;   XI ROBOTIC ASSISTED VENTRAL HERNIA N/A 03/17/2022   Procedure: XI ROBOTIC ASSISTED VENTRAL HERNIA;  Surgeon: [name], MD;  Location: [facility];  Service: General;  Laterality: N/A;   Patient Active Problem List   Diagnosis Date Noted   Chronic hepatitis C (HCC) 03/09/2024   Malnutrition of moderate degree 01/06/2024   Spondylolisthesis at L4-L5 level 12/31/2023   Supine hypertension 12/03/2023   Parkinson's disease with neurogenic orthostatic hypotension (HCC) 12/03/2023   Preop cardiovascular exam 12/03/2023   Glaucoma 11/30/2023   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   Acquired hallux rigidus of right foot 11/23/2022   Ventral hernia without obstruction or gangrene    Primary open angle glaucoma of  left eye, mild stage 06/23/2021   Facial numbness 12/24/2020   Neovascular glaucoma, right eye, stage unspecified 12/16/2020   Lumbar radiculopathy 11/25/2020   Gastroesophageal reflux disease 09/10/2020   Dry eyes, bilateral 08/15/2020   Presbycusis of right ear 01/17/2020   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   Secondary corneal edema of right eye 12/12/2019   Ptosis of right eyelid 12/12/2019   OAB (overactive bladder) 11/29/2019   Bunion of great toe of right foot 07/14/2017   Claw toe, acquired, right 07/14/2017   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   Medication management 10/09/2016   Plantar fasciitis of right foot 02/19/2015   Depression 08/14/2014   Family history of heart disease in male family member before age 54 04/26/2014   Mild cognitive impairment 03/13/2014   Parkinsonian tremor (HCC) 02/12/2014   Hyperlipidemia with target LDL less than 100    Abdominal aortic atherosclerosis (HCC)    Moderate essential hypertension 02/25/2011  Arthropathy 02/25/2011   HAV (hallux abducto valgus) 02/25/2011   ONSET DATE: 01/21/2024 (referral date)   REFERRING DIAG: M43.16 (ICD-10-CM) - Spondylolisthesis, lumbar region  THERAPY DIAG:  Other lack of coordination  Visuospatial deficit  Other symptoms and signs involving the nervous system  Muscle weakness (generalized)  Unsteadiness on feet  Abnormal posture  Rationale for Evaluation and Treatment: Rehabilitation  SUBJECTIVE:   SUBJECTIVE STATEMENT: Pain 1/10 lower back. The line on my glasses helped. Can she do it again?   Pt accompanied by: self  PERTINENT HISTORY: recent back surgery (L3-5) on 12/31/23, Parkinson's disease causing autonomic dysregulation and orthostatic hypotension, leading to blood pressure fluctuations and dizziness upon standing. Lumbar radiculopathy (Chronic), PVD, HLD, spinal stenosis, BPH   Post-surgical  recovery complicated by inappropriate medication management and inadequate rehabilitation, with delirium post-anesthesia likely exacerbated by Parkinson's disease.    PRECAUTIONS: Other: can take off back brace when seated or in bed and some walking in house, but wear it mostly when up. Back precautions (no BLT), no lifting over 10 lbs, orthostatic hypotension   WEIGHT BEARING RESTRICTIONS: No  PAIN:  Are you having pain? No pain at today's session  FALLS: Has patient fallen in last 6 months? Yes. Number of falls 1  LIVING ENVIRONMENT: Lives with: lives with their spouse Lives in: 2 story home, but lives on first floor, 3 steps to enter Has following equipment at home: Vannie - 2 wheeled, shower chair, and Grab bars  PLOF: Needs assistance with ADLs prior to surgery, enjoys music and traveling  PATIENT GOALS: improve function and speed with ADLS including handwriting  OBJECTIVE:  Note: Objective measures were completed at Evaluation unless otherwise noted.  BP assessed and [facility] for therapy visit.   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  MOBILITY STATUS: Independent  POSTURE COMMENTS:  increased lumbar lordosis, increased thoracic kyphosis, and posterior pelvic tilt   FUNCTIONAL OUTCOME MEASURES: Fastening/unfastening 3 buttons: 53 sec Physical performance test: PPT#2 (simulated eating) 27.21 &  PPT#4 (donning/doffing jacket): 14.12 sec (w/ hospital gown)   COORDINATION: 9 Hole Peg test: Right: 31.60 sec; Left: 41.77 sec Box and Blocks:  Right: 37 blocks; Left: 37 blocks  9 Hole Peg test: 03/30/2024 Right: 34.1, 30.2, 31.4 (all trials with glasses)  Average: 31.9  Left: 41.5 sec (without glasses)  38.6 sec (with glasses), 37.9 (with glasses) Average: 39.3     UE ROM:  WFL   SENSATION: Fingertips get cold   COGNITION: Overall cognitive status: Impaired and decreased memory, processing speed  VISION:  Pt/family reports can only see light and images from Rt eye (? Legally blind), glaucoma and decreased vision Lt eye  OBSERVATIONS: Bradykinesia, Hypokinesia, and decline in cognition and overall function since back surgery  TREATMENT:   This therapist had previous O.T. draw line on glasses again per pt request, and reminded him where to look (below red line is for reading, above red line is for distance)   Assessed remaining STG (Box & Blocks) - see below. Pt declined bilaterally Re-iterated importance of doing HEP's daily to maintain and hopefully improve function.  Reviewed handwriting strategies and practiced writing - pt still writing too fast and too close together where letters overlap despite cues to slow down, write big, and separate letters. Pt with approx 75% legibility Pt issued PD graph paper (from [employer]) and was able to slow down and write more legibly w/ correct spacing b/t letters and words at 95% legibility  Standing: incorporating PWR! Step into functional activity flipping cards over w/ cues to step big, reach big, open hand, clear table of hand (pt keeps hitting on Rt side) and calling out card loud for voice boosts - pt performed bilateral sides w/ more difficulty on Rt side.   Pt shown how to incorporate PWR! Rock and/or  PWR! Step into daily activities - putting away groceries, dishes, etc. W/ countertop support for fall prevention.  Pt also shown ways to reach for targets standing in corner w/ contralateral and ipsilateral reaching   PATIENT EDUCATION: Education details: see above Person educated: Patient Education method: Explanation, Demonstration, and Verbal cues Education comprehension: verbalized understanding, returned demonstration, and verbal cues required   HOME EXERCISE PROGRAM: 02/17/2024: handwriting changes 02/24/2024: new behind head bag exercise handout 03/07/2024: Exercise Chart 03/14/2024: golf solitaire; coordination HEP 03/16/2024: Buttoning and jacket strategies 03/20/24: tremor strategies 03/22/24: ways to prevent future PD related complications and memory strategies 03/28/24: ways to keep thinking skills sharp  GOALS: Goals reviewed with patient? Yes  SHORT TERM GOALS: Target date: 03/10/24  Independent with PD specific HEP (Will need to adapt d/t current back precautions, consider adding bag ex's for ADLS) Baseline: Goal status: MET  2.  Pt to write name with 90% or greater legibility with only min micrographia Baseline:  Goal status: MET   3. Pt will demonstrate improved ease with feeding as evidenced by decreasing PPT#2 by 8 secs or more Baseline: 27.21 sec Goal status: MET (03/20/24: 16 sec. Using white bowl d/t visual deficits)  4.  Pt will demonstrate improved ease with fastening buttons as evidenced by decreasing 3 button/unbutton time by 5 seconds or more Baseline: 53 sec Goal status: MET (03/20/24 = 32.46 sec w/ shirt ON d/t decreased vision)   5.  Pt will be able to place at least 3 additional blocks bilaterally with completion of Box and Blocks test. Baseline: Right: 37 blocks; Left: 37 blocks Goal status: NOT MET (04/03/24: RT = 35, LT = 33)   LONG TERM GOALS: Target date: 04/10/24  Pt will verbalize understanding of ways to prevent future PD related complications  and appropriate community resources prn  Baseline:  Goal status: MET  2.  Pt will verbalize understanding of ways to keep thinking skills sharp and ways to compensate for STM changes in the future  Baseline:  Goal status: MET  3.  Pt will verbalize understanding of adaptive strategies to increase ease with ADLS/IADLS   Baseline:  Goal status: MET  4.  Pt will write a sentence with no significant decrease in size and maintain 90% legibility  Baseline:  Goal status: NOT MET (approx 75%, however can achieve goal using graph paper)   5.  Pt will demonstrate improved fine motor coordination for  ADLs as evidenced by decreasing 9 hole peg test score for Lt hand by 6 secs  Baseline: 41.77 sec.  Goal status: INITIAL  6.  Pt will demonstrate improved fine motor coordination for ADLs as evidenced by decreasing 9 hole peg test score for Rt hand by 3 secs  Baseline: 31.60 sec Goal status: INITIAL  ASSESSMENT:  CLINICAL IMPRESSION: Pt presents with decreased UE function as evidence to decline in Box & Blocks test. ? Carryover of HEP's at home. He demonstrates good rehab potential as he has improved on his standing balance and tolerance when engaging in tabletop activities .   PERFORMANCE DEFICITS: in functional skills including ADLs, IADLs, coordination, dexterity, proprioception, sensation, ROM, strength, pain, Fine motor control, Gross motor control, hearing, mobility, balance, body mechanics, endurance, decreased knowledge of use of DME, vision, and UE functional use, cognitive skills including memory and safety awareness, and psychosocial skills including coping strategies.   IMPAIRMENTS: are limiting patient from ADLs, IADLs, rest and sleep, leisure, and social participation.   COMORBIDITIES:  has co-morbidities such as recent back surgery with back precautions, orthostatic hypotension, fluctuating BP that affects occupational performance. Patient will benefit from skilled OT to address above  impairments and improve overall function.  REHAB POTENTIAL: Good  PLAN:  OT FREQUENCY: 2x/week  OT DURATION: 8 weeks  PLANNED INTERVENTIONS: 97535 self care/ADL training, 02889 therapeutic exercise, 97530 therapeutic activity, 97112 neuromuscular re-education, 97140 manual therapy, 97113 aquatic therapy, 97035 ultrasound, 97010 moist heat, 97129 Cognitive training (first 15 min), 02869 Cognitive training(each additional 15 min), 02249 Physical Performance Testing, passive range of motion, functional mobility training, visual/perceptual remediation/compensation, energy conservation, coping strategies training, patient/family education, and DME and/or AE instructions  RECOMMENDED OTHER SERVICES: will request speech therapy referral but will wait to make speech evaluation until after pt gets new hearing aids  CONSULTED AND AGREED WITH PLAN OF CARE: Patient  PLAN FOR NEXT SESSION:  Check remaining LTG's (#5/#6), address any further vision needs, d/c next session  Burnard JINNY Roads, OT 04/03/2024, 12:00 PM

## 2024-04-04 ENCOUNTER — Other Ambulatory Visit: Payer: Self-pay

## 2024-04-04 ENCOUNTER — Encounter: Payer: Self-pay | Admitting: Speech Pathology

## 2024-04-04 ENCOUNTER — Ambulatory Visit: Admitting: Speech Pathology

## 2024-04-04 DIAGNOSIS — R471 Dysarthria and anarthria: Secondary | ICD-10-CM | POA: Diagnosis not present

## 2024-04-04 DIAGNOSIS — R278 Other lack of coordination: Secondary | ICD-10-CM | POA: Diagnosis not present

## 2024-04-04 DIAGNOSIS — R1312 Dysphagia, oropharyngeal phase: Secondary | ICD-10-CM | POA: Diagnosis not present

## 2024-04-04 DIAGNOSIS — R2681 Unsteadiness on feet: Secondary | ICD-10-CM | POA: Diagnosis not present

## 2024-04-04 DIAGNOSIS — R41841 Cognitive communication deficit: Secondary | ICD-10-CM

## 2024-04-04 DIAGNOSIS — R293 Abnormal posture: Secondary | ICD-10-CM | POA: Diagnosis not present

## 2024-04-04 DIAGNOSIS — R29818 Other symptoms and signs involving the nervous system: Secondary | ICD-10-CM | POA: Diagnosis not present

## 2024-04-04 DIAGNOSIS — M6281 Muscle weakness (generalized): Secondary | ICD-10-CM | POA: Diagnosis not present

## 2024-04-04 DIAGNOSIS — M5459 Other low back pain: Secondary | ICD-10-CM | POA: Diagnosis not present

## 2024-04-04 DIAGNOSIS — G20A2 Parkinson's disease without dyskinesia, with fluctuations: Secondary | ICD-10-CM | POA: Diagnosis not present

## 2024-04-04 DIAGNOSIS — R251 Tremor, unspecified: Secondary | ICD-10-CM | POA: Diagnosis not present

## 2024-04-04 NOTE — Therapy (Signed)
 OUTPATIENT SPEECH LANGUAGE PATHOLOGY PARKINSON'S EVALUATION   Patient Name: [name] MRN: [id number] DOB:1951-05-23, 73 y.o., male Today's Date: 04/04/2024  PCP: [name], MD REFERRING PROVIDER: [name], NP  END OF SESSION:  End of Session - 04/04/24 1459     Visit Number 1    Number of Visits 13    Date for SLP Re-Evaluation 06/27/24   extended due to scheduling   SLP Start Time 1315    SLP Stop Time  1400    SLP Time Calculation (min) 45 min    Activity Tolerance Patient tolerated treatment well          Past Medical History:  Diagnosis Date   Allergy    seasonal   Anxiety    Arthritis    BPH (benign prostatic hyperplasia)    Complication of anesthesia    Pt. stated he had a reaction that ended in him requiring urinary cath placement; hx delirium worsening parkinson symptoms   Depression    Diverticulosis    GERD (gastroesophageal reflux disease)    esophageal spasms   Glaucoma    Gout    Head injury, closed, with concussion    Hepatitis C    chronic - Has been treated with Harvoni   HLD (hyperlipidemia)    statin intolerant (Crestor  & Simvastatin ) - Taking Livalo  1mg  / week   Hypertension    Neuromuscular disorder (HCC)    Parkinson's Disease   Parkinson's disease (HCC)    Plantar fasciitis    right   PVD (peripheral vascular disease) (HCC)    With no claudication; only mild abdominal aortic atherosclerosis noted on ultrasound.   Sleep apnea    Wears CPAP nightly   Spinal stenosis of lumbar region    Thoracic ascending aortic aneurysm (HCC)    4.2 cm ascending TAA 09/2016 CT, 1 yr f/u rec   Ulcer    Past Surgical History:  Procedure Laterality Date   ANTERIOR LAT LUMBAR FUSION N/A 12/31/2023   Procedure: Prone trans-psoas interbody fusion - Lumbar three-Lumbar four - Lumbar four-Lumbar five, right facetectomy Lumbar four-five;  Surgeon: [name], MD;  Location: [facility];  Service: Neurosurgery;  Laterality: N/A;   BUNIONECTOMY  WITH WEIL OSTEOTOMY Right 11/02/2019   Procedure: Right Foot Lapidus, Modified McBride Bunionectomy,  Hallux Akin Osteotomy;  Surgeon: [name], MD;  Location: [facility];  Service: Orthopedics;  Laterality: Right;   CARDIAC CATHETERIZATION  2005   30% Cx. Dr. Lavon   cataract surgery Left 11/05/2014   COLONOSCOPY     COLONOSCOPY N/A 01/09/2021   Procedure: COLONOSCOPY;  Surgeon: [name], MD;  Location: [facility];  Service: Gastroenterology;  Laterality: N/A;   ESOPHAGOGASTRODUODENOSCOPY N/A 05/02/2015   Procedure: ESOPHAGOGASTRODUODENOSCOPY (EGD);  Surgeon: [name], MD;  Location: [facility];  Service: Endoscopy;  Laterality: N/A;   ESOPHAGOGASTRODUODENOSCOPY  05/02/2015   no source of pt chest pain endoscopically evident. small hiatal hernia.   ESOPHAGOGASTRODUODENOSCOPY (EGD) WITH PROPOFOL  N/A 01/02/2021   Procedure: ESOPHAGOGASTRODUODENOSCOPY (EGD) WITH PROPOFOL ;  Surgeon: [name], MD;  Location: [facility];  Service: Endoscopy;  Laterality: N/A;   HARDWARE REMOVAL Right 11/02/2019   Procedure: Second Metatarsal Removal of Deep Implant and Rotational Osteotomy;  Surgeon: [name], MD;  Location: [facility];  Service: Orthopedics;  Laterality: Right;   IR ANGIOGRAM SELECTIVE EACH ADDITIONAL VESSEL  01/04/2021   IR ANGIOGRAM SELECTIVE EACH ADDITIONAL VESSEL  01/04/2021   IR ANGIOGRAM VISCERAL SELECTIVE  01/04/2021  IR US  GUIDE VASC ACCESS RIGHT  01/04/2021   LUMBAR LAMINECTOMY/DECOMPRESSION MICRODISCECTOMY N/A 07/03/2021   Procedure: Lumbar three through five decompression with lumbar three through five insitu fusion;  Surgeon: [name], MD;  Location: [facility];  Service: Orthopedics;  Laterality: N/A;   LUMBAR PERCUTANEOUS PEDICLE SCREW 2 LEVEL N/A 12/31/2023   Procedure: LUMBAR PERCUTANEOUS PEDICLE SCREW LUMBAR THREE-LUMBAR FIVE;  Surgeon: [name], MD;  Location: [facility];  Service: Neurosurgery;  Laterality: N/A;    MEMBRANE PEEL Left 03/14/2014   Procedure: MEMBRANE PEEL; ENDOLASER;  Surgeon: [name], MD;  Location: [facility];  Service: Ophthalmology;  Laterality: Left;   NM MYOVIEW  LTD  10/2015   LOW RISK. Small, fixed basal lateral defect - likely diaphragmatic attenuation. EF 69%   PARS PLANA VITRECTOMY Left 03/14/2014   Procedure: PARS PLANA VITRECTOMY WITH 25 GAUGE;  Surgeon: [name], MD;  Location: [facility] OR;  Service: Ophthalmology;  Laterality: Left;   shave  02/03/2022   angiofibroma   TONSILLECTOMY     TRANSTHORACIC ECHOCARDIOGRAM  01/07/2021   Normal EF 60 to 65%.  No R WMA.  GRII DD-moderately dilated LA..  Mildly dilated RV but normal function.  Normal RAP/CVP.  Trivial AI with mild to moderate sclerosis-no stenosis   UMBILICAL HERNIA REPAIR  2023   UPPER GASTROINTESTINAL ENDOSCOPY     WEIL OSTEOTOMY Right 09/01/2017   Procedure: RIGHT GREAT TOE CHEVRON AND WEIL OSTEOTOMY 2ND METATARSAL;  Surgeon: [name], MD;  Location: [facility];  Service: Orthopedics;  Laterality: Right;   XI ROBOTIC ASSISTED VENTRAL HERNIA N/A 03/17/2022   Procedure: XI ROBOTIC ASSISTED VENTRAL HERNIA;  Surgeon: [name], MD;  Location: [facility];  Service: General;  Laterality: N/A;   Patient Active Problem List   Diagnosis Date Noted   Chronic hepatitis C (HCC) 03/09/2024   Malnutrition of moderate degree 01/06/2024   Spondylolisthesis at L4-L5 level 12/31/2023   Supine hypertension 12/03/2023   Parkinson's disease with neurogenic orthostatic hypotension (HCC) 12/03/2023   Preop cardiovascular exam 12/03/2023   Glaucoma 11/30/2023   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   Acquired hallux rigidus of right foot 11/23/2022   Ventral hernia without obstruction or gangrene    Primary open angle glaucoma of left eye, mild stage 06/23/2021   Facial numbness 12/24/2020   Neovascular glaucoma, right eye, stage unspecified 12/16/2020   Lumbar radiculopathy 11/25/2020   Gastroesophageal reflux disease  09/10/2020   Dry eyes, bilateral 08/15/2020   Presbycusis of right ear 01/17/2020   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   Secondary corneal edema of right eye 12/12/2019   Ptosis of right eyelid 12/12/2019   OAB (overactive bladder) 11/29/2019   Bunion of great toe of right foot 07/14/2017   Claw toe, acquired, right 07/14/2017   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   Medication management 10/09/2016   Plantar fasciitis of right foot 02/19/2015   Depression 08/14/2014   Family history of heart disease in male family member before age 67 04/26/2014   Mild cognitive impairment 03/13/2014   Parkinsonian tremor (HCC) 02/12/2014   Hyperlipidemia with target LDL less than 100    Abdominal aortic atherosclerosis (HCC)    Moderate essential hypertension 02/25/2011   Arthropathy 02/25/2011   HAV (hallux abducto valgus) 02/25/2011    ONSET DATE: 02/08/24 (referral date)  REFERRING DIAG: G20.A2 (ICD-10-CM) - Parkinson's disease without dyskinesia, with fluctuating manifestations (HCC)  THERAPY DIAG:  Dysarthria and anarthria  Dysphagia, oropharyngeal phase  Cognitive communication deficit  Rationale for Evaluation and Treatment: Rehabilitation  SUBJECTIVE:   SUBJECTIVE STATEMENT: I can't remember what she said or what I was saying Pt accompanied by: significant other Bea  PERTINENT HISTORY: recent back surgery (L3-5) on 12/31/23, Parkinson's disease causing autonomic dysregulation and orthostatic hypotension, leading to blood pressure fluctuations and dizziness upon standing. Lumbar radiculopathy (Chronic), PVD, HLD, spinal stenosis, BPH   Post-surgical recovery complicated by inappropriate medication management and inadequate rehabilitation, with delirium post-anesthesia likely exacerbated by Parkinson's disease.     PAIN:  Are you having pain? No  FALLS: Has patient fallen in last 6 months?  See PT  evaluation for details  LIVING ENVIRONMENT: Lives with: lives with their spouse Lives in: House/apartment  PLOF:  Level of assistance: Needed assistance with ADLs, Needed assistance with IADLS Employment: Retired  PATIENT GOALS: To remember what was said  OBJECTIVE:  Note: Objective measures were completed at Evaluation unless otherwise noted.  COGNITION: Overall cognitive status: Impaired Areas of impairment: Attention, Memory, Following commands, Awareness, and Problem solving Comments: forgets conversation, slow processing during conversation, alternating and divided attention impaired  MOTOR SPEECH: Overall motor speech: impaired Level of impairment: Phrase Respiration: thoracic breathing Phonation: hoarse and low vocal intensity Resonance: WFL Articulation: Impaired: phrase and they report intermittent stutter and freezing Intelligibility: Intelligibility reduced Motor planning: Appears intact Motor speech errors: hearing lossunaware Interfering components:  Effective technique: slow rate and increased vocal intensity  ORAL MOTOR EXAMINATION: Overall status: WFL Comments:    OBJECTIVE VOICE ASSESSMENT: Sustained ah maximum phonation time: 23 seconds Sustained ah loudness average: 67 dB Oral reading (passage) loudness average: 67 dB Oral reading loudness range: 65-72 dB Conversational loudness average: 65 dB Conversational loudness range: 61 to 70 dB Voice quality: hoarse and low vocal intensity Stimulability trials: Given SLP modeling and occasional mod cues, loudness average increased to 84dB (range of 81 to 88) at loud ah,  conversation) level.  Comments:   Completed audio recording of patients baseline voice without cueing from SLP: Yes  Pt does report difficulty with swallowing which does not warrant further evaluation. Will monitor and manage with general swallow precautions for PD - will order MBSS if this becomes warranted. Passed Yale swallow  test.   PATIENT REPORTED OUTCOME MEASURES (PROM): deferred                                                                                                                            TREATMENT DATE:   04/04/24: Evaluation completed. Introduced [employer] - ordered [employer] OUT! Booklet and [employer]. Demonstrated navigating website to locate What is Parkinsons video and home practice sessions. Initiated SPEAK OUT! Lesson 1 to  target volume, intelligibility and speaking with intent as well as cognitive communication exercises. With usual min to mod A, [name] averaged 79dB on sustained Ah, 76dB on glides, 78dB counting and 75dB reading. In conversation/cognitive exercise, with verbal cues and modeling,  he averaged 71dB. Instructed them to complete Lesson one again in [employer], as well at to complete home practice sessions on website.    PATIENT EDUCATION: Education details: HEP for dysarthria, swallow precautions, compensations for slow processing and following conversation Person educated: Patient and Spouse Education method: Programmer, multimedia, Facilities manager, Verbal cues, and Handouts Education comprehension: verbal cues required and needs further education  HOME EXERCISE PROGRAM: SPEAK OUT!   GOALS: Goals reviewed with patient? Yes  SHORT TERM GOALS: Target date: 05/29/24 (extended due to scheduling)  Pt will complete HEP for dysarthria with occasional min A over 2 sessions Baseline: Goal status: INITIAL  2.  Pt will average 74dB 18/20 sentences Baseline:  Goal status: INITIAL  3.  Pt will follow swallow precautions with occasional min A Baseline:  Goal status: INITIAL  4.  Pt and spouse will carryover 2 compensatory strategies to support slow processing  Baseline:  Goal status: INITIAL  5.  Pt will average 72dB over 8 minute conversation Baseline:  Goal status: INITIAL   LONG TERM GOALS: Target date: 06/27/24  Pt will complete HEP with rare min A over  2 sessions Baseline:  Goal status: INITIAL  2.  Pt will complete 3 online home practice sessions Baseline:  Goal status: INITIAL  3.  Pt will access [employer] and complete 3 lessons over 2 weeks with min A from spouse Baseline:  Goal status: INITIAL  4.  Pt will report no episodes of globus sensation with pills Baseline:  Goal status: INITIAL  5.  Pt will average 72dB over 15 minute conversation with rare min A Baseline:  Goal status: INITIAL  6.  Pt and spouse will carry over 4 compensatory strategies to support slow processing in conversation and directions Baseline:  Goal status: INITIAL  ASSESSMENT:  CLINICAL IMPRESSION: Patient is a 73 y.o. 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 They report that Garrel freezes when speaking, has episodes of dysfluency and looses the topic.Bea reports slow follow through  and difficulty following more than one direction at a time. Garrel endorses difficulty maintaining his train of thought and following conversations. He is using a calendar to recall appointments, he is using the microwave and coffee maker independently. Bea reports increased difficulty using their remote control system and that she tells him to put out the dog, and he forgets to let the dog back in. Overall volume low affecting intelligibility. Garrel passed Yale swallow test, however he reports pills get stuck and he is having more difficulty swallowing solids. Will monitor and order MBSS if needed. I recommend skilled ST to maximize intelligibility, safety of swallow and cognition for safety, to reduce caregiver burden and improve participation in conversations for QOL. .   OBJECTIVE IMPAIRMENTS: Objective impairments include attention, memory, awareness, executive functioning, dysarthria, and dysphagia. These impairments are limiting patient from managing medications, managing appointments,  managing finances, household responsibilities, ADLs/IADLs, effectively communicating at home and in community, and safety when swallowing.Factors affecting potential to achieve goals and functional outcome are ability to learn/carryover information and medical prognosis.. Patient will benefit from skilled SLP services to address above impairments and improve overall function.  REHAB POTENTIAL: Good  PLAN:  SLP FREQUENCY: 1-2x/week  SLP DURATION: 12 weeks  PLANNED INTERVENTIONS: Aspiration precaution training, Pharyngeal strengthening exercises, Diet toleration management , Language facilitation, Environmental controls, Trials of upgraded texture/liquids, Cueing hierachy, Cognitive reorganization, Internal/external aids, Functional tasks, Multimodal communication approach, SLP instruction and feedback,  Compensatory strategies, Patient/family education, [phone] Treatment of speech (30 or 45 min) , [id number] Treatment of swallowing function, [phone]- Speech [address] Artic, Phon, Eval Compre, Express, and MBSS    Ai Sonnenfeld, Leita Caldron, CCC-SLP 04/04/2024, 3:24 PM

## 2024-04-04 NOTE — Patient Instructions (Signed)
   Parkinson Voice Project website  Under Education watch what is Parkinsons? - you, Bea, your kids  Under Our Program - complete a home practice session - scroll down to find previously recorded sessions  Complete Lesson one in the Work book in the [employer]  Each lesson should be completed twice

## 2024-04-05 ENCOUNTER — Other Ambulatory Visit: Payer: Self-pay

## 2024-04-06 ENCOUNTER — Ambulatory Visit: Admitting: Occupational Therapy

## 2024-04-06 ENCOUNTER — Encounter: Payer: Self-pay | Admitting: Physical Therapy

## 2024-04-06 ENCOUNTER — Ambulatory Visit: Admitting: Physical Therapy

## 2024-04-06 VITALS — BP 116/71 | HR 59

## 2024-04-06 DIAGNOSIS — M6281 Muscle weakness (generalized): Secondary | ICD-10-CM

## 2024-04-06 DIAGNOSIS — R29818 Other symptoms and signs involving the nervous system: Secondary | ICD-10-CM | POA: Diagnosis not present

## 2024-04-06 DIAGNOSIS — R2681 Unsteadiness on feet: Secondary | ICD-10-CM

## 2024-04-06 DIAGNOSIS — M5459 Other low back pain: Secondary | ICD-10-CM

## 2024-04-06 DIAGNOSIS — R251 Tremor, unspecified: Secondary | ICD-10-CM

## 2024-04-06 DIAGNOSIS — R278 Other lack of coordination: Secondary | ICD-10-CM | POA: Diagnosis not present

## 2024-04-06 DIAGNOSIS — R293 Abnormal posture: Secondary | ICD-10-CM | POA: Diagnosis not present

## 2024-04-06 DIAGNOSIS — R471 Dysarthria and anarthria: Secondary | ICD-10-CM | POA: Diagnosis not present

## 2024-04-06 DIAGNOSIS — R1312 Dysphagia, oropharyngeal phase: Secondary | ICD-10-CM | POA: Diagnosis not present

## 2024-04-06 DIAGNOSIS — G20A2 Parkinson's disease without dyskinesia, with fluctuations: Secondary | ICD-10-CM

## 2024-04-06 DIAGNOSIS — R41842 Visuospatial deficit: Secondary | ICD-10-CM

## 2024-04-06 NOTE — Therapy (Signed)
 OUTPATIENT PHYSICAL THERAPY NEURO TREATMENT     Patient Name: [name] MRN: [id number] DOB:[birth year], 73 y.o., male Today's Date: 04/06/2024   PCP: [name], MD   REFERRING PROVIDER: Caleen Dirks, MD (sent to Harlene Bogaert, NP)  END OF SESSION:  PT End of Session - 04/06/24 1231     Visit Number 9    Number of Visits 13    Date for PT Re-Evaluation 04/27/24   Recert   Authorization Type [employer]    PT Start Time 1230    PT Stop Time 1311    PT Time Calculation (min) 41 min    Equipment Utilized During Treatment Gait belt    Activity Tolerance Patient tolerated treatment well    Behavior During Therapy WFL for tasks assessed/performed;Flat affect          Past Medical History:  Diagnosis Date   Allergy    seasonal   Anxiety    Arthritis    BPH (benign prostatic hyperplasia)    Complication of anesthesia    Pt. stated he had a reaction that ended in him requiring urinary cath placement; hx delirium worsening parkinson symptoms   Depression    Diverticulosis    GERD (gastroesophageal reflux disease)    esophageal spasms   Glaucoma    Gout    Head injury, closed, with concussion    Hepatitis C    chronic - Has been treated with Harvoni   HLD (hyperlipidemia)    statin intolerant (Crestor  & Simvastatin ) - Taking Livalo  1mg  / week   Hypertension    Neuromuscular disorder (HCC)    Parkinson's Disease   Parkinson's disease (HCC)    Plantar fasciitis    right   PVD (peripheral vascular disease) (HCC)    With no claudication; only mild abdominal aortic atherosclerosis noted on ultrasound.   Sleep apnea    Wears CPAP nightly   Spinal stenosis of lumbar region    Thoracic ascending aortic aneurysm (HCC)    4.2 cm ascending TAA 09/2016 CT, 1 yr f/u rec   Ulcer    Past Surgical History:  Procedure Laterality Date   ANTERIOR LAT LUMBAR FUSION N/A 12/31/2023   Procedure: Prone trans-psoas interbody fusion - Lumbar three-Lumbar four - Lumbar  four-Lumbar five, right facetectomy Lumbar four-five;  Surgeon: [name], MD;  Location: [facility];  Service: Neurosurgery;  Laterality: N/A;   BUNIONECTOMY WITH WEIL OSTEOTOMY Right 11/02/2019   Procedure: Right Foot Lapidus, Modified McBride Bunionectomy,  Hallux Akin Osteotomy;  Surgeon: [name], MD;  Location: [facility];  Service: Orthopedics;  Laterality: Right;   CARDIAC CATHETERIZATION  2005   30% Cx. Dr. Lavon   cataract surgery Left 11/05/2014   COLONOSCOPY     COLONOSCOPY N/A 01/09/2021   Procedure: COLONOSCOPY;  Surgeon: [name], MD;  Location: [facility];  Service: Gastroenterology;  Laterality: N/A;   ESOPHAGOGASTRODUODENOSCOPY N/A 05/02/2015   Procedure: ESOPHAGOGASTRODUODENOSCOPY (EGD);  Surgeon: [name], MD;  Location: [facility];  Service: Endoscopy;  Laterality: N/A;   ESOPHAGOGASTRODUODENOSCOPY  05/02/2015   no source of pt chest pain endoscopically evident. small hiatal hernia.   ESOPHAGOGASTRODUODENOSCOPY (EGD) WITH PROPOFOL  N/A 01/02/2021   Procedure: ESOPHAGOGASTRODUODENOSCOPY (EGD) WITH PROPOFOL ;  Surgeon: [name], MD;  Location: [facility];  Service: Endoscopy;  Laterality: N/A;   HARDWARE REMOVAL Right 11/02/2019   Procedure: Second Metatarsal Removal of Deep Implant and Rotational Osteotomy;  Surgeon: [name], MD;  Location: [facility];  Service:  Orthopedics;  Laterality: Right;   IR ANGIOGRAM SELECTIVE EACH ADDITIONAL VESSEL  01/04/2021   IR ANGIOGRAM SELECTIVE EACH ADDITIONAL VESSEL  01/04/2021   IR ANGIOGRAM VISCERAL SELECTIVE  01/04/2021   IR US  GUIDE VASC ACCESS RIGHT  01/04/2021   LUMBAR LAMINECTOMY/DECOMPRESSION MICRODISCECTOMY N/A 07/03/2021   Procedure: Lumbar three through five decompression with lumbar three through five insitu fusion;  Surgeon: [name], MD;  Location: [facility] OR;  Service: Orthopedics;  Laterality: N/A;   LUMBAR PERCUTANEOUS PEDICLE SCREW 2 LEVEL N/A 12/31/2023    Procedure: LUMBAR PERCUTANEOUS PEDICLE SCREW LUMBAR THREE-LUMBAR FIVE;  Surgeon: [name], MD;  Location: [facility];  Service: Neurosurgery;  Laterality: N/A;   MEMBRANE PEEL Left 03/14/2014   Procedure: MEMBRANE PEEL; ENDOLASER;  Surgeon: [name], MD;  Location: [facility];  Service: Ophthalmology;  Laterality: Left;   NM MYOVIEW  LTD  10/2015   LOW RISK. Small, fixed basal lateral defect - likely diaphragmatic attenuation. EF 69%   PARS PLANA VITRECTOMY Left 03/14/2014   Procedure: PARS PLANA VITRECTOMY WITH 25 GAUGE;  Surgeon: [name], MD;  Location: [facility] OR;  Service: Ophthalmology;  Laterality: Left;   shave  02/03/2022   angiofibroma   TONSILLECTOMY     TRANSTHORACIC ECHOCARDIOGRAM  01/07/2021   Normal EF 60 to 65%.  No R WMA.  GRII DD-moderately dilated LA..  Mildly dilated RV but normal function.  Normal RAP/CVP.  Trivial AI with mild to moderate sclerosis-no stenosis   UMBILICAL HERNIA REPAIR  2023   UPPER GASTROINTESTINAL ENDOSCOPY     WEIL OSTEOTOMY Right 09/01/2017   Procedure: RIGHT GREAT TOE CHEVRON AND WEIL OSTEOTOMY 2ND METATARSAL;  Surgeon: [name], MD;  Location: [facility];  Service: Orthopedics;  Laterality: Right;   XI ROBOTIC ASSISTED VENTRAL HERNIA N/A 03/17/2022   Procedure: XI ROBOTIC ASSISTED VENTRAL HERNIA;  Surgeon: [name], MD;  Location: [facility];  Service: General;  Laterality: N/A;   Patient Active Problem List   Diagnosis Date Noted   Chronic hepatitis C (HCC) 03/09/2024   Malnutrition of moderate degree 01/06/2024   Spondylolisthesis at L4-L5 level 12/31/2023   Supine hypertension 12/03/2023   Parkinson's disease with neurogenic orthostatic hypotension (HCC) 12/03/2023   Preop cardiovascular exam 12/03/2023   Glaucoma 11/30/2023   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   Acquired hallux rigidus of right foot 11/23/2022   Ventral hernia without obstruction or gangrene    Primary open angle glaucoma of left eye, mild stage 06/23/2021    Facial numbness 12/24/2020   Neovascular glaucoma, right eye, stage unspecified 12/16/2020   Lumbar radiculopathy 11/25/2020   Gastroesophageal reflux disease 09/10/2020   Dry eyes, bilateral 08/15/2020   Presbycusis of right ear 01/17/2020   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   Secondary corneal edema of right eye 12/12/2019   Ptosis of right eyelid 12/12/2019   OAB (overactive bladder) 11/29/2019   Bunion of great toe of right foot 07/14/2017   Claw toe, acquired, right 07/14/2017   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   Medication management 10/09/2016   Plantar fasciitis of right foot 02/19/2015   Depression 08/14/2014   Family history of heart disease in male family member before age 81 04/26/2014   Mild cognitive impairment 03/13/2014   Parkinsonian tremor (HCC) 02/12/2014   Hyperlipidemia with target LDL less than 100    Abdominal aortic atherosclerosis (HCC)    Moderate essential hypertension 02/25/2011   Arthropathy 02/25/2011   HAV (  hallux abducto valgus) 02/25/2011    ONSET DATE: 01/19/2024  REFERRING DIAG: M43.16 (ICD-10-CM) - Spondylolisthesis, lumbar region  THERAPY DIAG:  Other symptoms and signs involving the nervous system  Muscle weakness (generalized)  Unsteadiness on feet  Rationale for Evaluation and Treatment: Rehabilitation  SUBJECTIVE:                                                                                                                                                                                             SUBJECTIVE STATEMENT: Nothing new, no falls. Wants to keep working on his balance. Being more intentional with his exercises. No lightheadedness or dizziness today.   Pt accompanied by: self  PERTINENT HISTORY: PMH: s/p L3-5 prone trans-psoas approach for lateral interbody fusion; R L4-5 facetectomy and posterior segmental instrumentation L3-5 (12/2023).  PMH significant for PD (complicated by tremor, gait impairment, REM sleep behavior and cognitive impairment ), HTN, DM, gout, glaucoma, PVD   Per note from [facility]: He underwent back surgery on 6/6 (L2-5 lateral interbody fusion with posterior instrumentation) with postop confusion and delirium requiring restraints,  improved with use of Seroquel  and discontinuing opioids and muscle relaxants, suspected confusion due to GA, PD and possibly medication side effects.  He was discharged on Seroquel  25/50.  He was discharged to [facility] rehab on 6/18, has not yet started therapies. He continues to have occasional confusion although some what improving per wife, at times with paranoia and visual hallucinations, also has been wandering and currently wearing locator band on ankle   PAIN:  Are you having pain? No and a little soreness from doing the exercises      PRECAUTIONS: Back, Fall, and Other: hx of orthostatics, pt's spouse has to remind him of his precautions    FALLS: Has patient fallen in last 6 months? Yes. Number of falls 1, had a fall getting up and fell back against the window, this happened about a week or 2 before the surgery   LIVING ENVIRONMENT: Lives with: lives with their spouse Lives in: House/apartment Stairs: Yes: Internal: 15 steps; on right going up and External: 2 on the front porch and 3 on the side steps; can reach both, doesn't have to go up indoor steps, can live on the bottom floor  Has following equipment at home: Walker - 2 wheeled, shower chair, and Grab bars  PLOF: Independent and Leisure: music, traveling Pt's spouse has to remind him of brace precautions for ADLs   PATIENT GOALS: Wants to be able to be more independent, stronger, and more mobile   OBJECTIVE:  Note: Objective measures were completed at Evaluation unless otherwise  noted.  COGNITION: Overall cognitive status: Impaired   SENSATION: Light touch: WFL Reports numbness/tingling  intermittently down front of BLE   COORDINATION: Heel to shin: slightly harder with LLE   POSTURE: rounded shoulders and forward head   LOWER EXTREMITY MMT:    MMT Right Eval Left Eval  Hip flexion 5 4+  Hip extension    Hip abduction 4 4  Hip adduction 4+ 4+  Hip internal rotation    Hip external rotation    Knee flexion 4+ 4+  Knee extension 4 -4  Ankle dorsiflexion 5 5  Ankle plantarflexion    Ankle inversion    Ankle eversion    (Blank rows = not tested)  All tested in sitting   BED MOBILITY:  Pt reports will feel stuck trying to get his legs in and out of the bed. Reviewed the log roll technique for bed mobility (PT demonstrating on the bed)  TRANSFERS: Sit to stand: SBA  Assistive device utilized: None     Stand to sit: SBA  Assistive device utilized: None      Mainly performs with UE support, but can perform without, with intermittent retropulsion  GAIT: Findings: Gait Characteristics: step through pattern, decreased arm swing- Right, decreased arm swing- Left, decreased stride length, decreased ankle dorsiflexion- Right, decreased ankle dorsiflexion- Left, Right foot flat, Left foot flat, shuffling, decreased trunk rotation, and narrow BOS, Distance walked: Clinic distances , Assistive device utilized:None, Level of assistance: SBA and CGA, and Comments: intermittent CGA during turns due to pt with narrow BOS   FUNCTIONAL TESTS:  5 times sit to stand: 21.4 seconds with no UE support, last rep pt with an episode of retropulsion  10 meter walk test: 11.5 seconds with no AD = 2.85 ft/sec        VITALS  Vitals:   04/06/24 1234  BP: 116/71  Pulse: (!) 59                                                                                                                                    TREATMENT:    Ther Act  Assessed vitals (see above) and WNL   SciFit multi-peaks level 8.5 for 8 minutes using BUE/BLEs for neural priming for reciprocal movement,  dynamic cardiovascular warmup and increased amplitude of stepping. Pt reporting RPE as 8/10   NMR:  With 5 blaze pods in a line on random color setting for weight shifting,side stepping, reaction times, visual scanning and SLS, performed on blue foam beam, standing at ballet bar Performed 4 bouts of 1 minute: 5 hits, 10 hits, 8 hits, 5 hits Pt using intermittent fingertip support for balance, but trying to let go of ballet bar at times  Pt needing frequent CGA/min A for balance due to losing balance frequently posteriorly when performing with no UE support   On rockerboard in A/P direction: Weight shifting for hip/ankle strategy and limits of stability,  pt needing intermittent UE support for balance Alternating SLS taps to 2 cones, 20 reps each side, needing primarily fingertip support for balance, pt either crushing cone or needing to grab on to bars when trying to perform without Alternating forward stepping strategy 12 reps each side, able to perform some reps without UE support and maintain balance  Standing PWR Up with heel raise 10 reps, pt with limited ROM with heel raise and needing to grab onto chair anteriorly    PATIENT EDUCATION: Education details: continue HEP Person educated: Patient Education method: Explanation Education comprehension: verbalized understanding and needs further education   HOME EXERCISE PROGRAM: Standing PWR Moves  Access Code: [email or web address] URL: [email or web address] Date: 03/07/2024 Prepared by: Sheffield Senate  Exercises - Sit to Stand  - 1-2 x daily - 4-5 x weekly - 2 sets - 10 reps  GOALS: Goals reviewed with patient? Yes  SHORT TERM GOALS: Target date: 02/29/2024  Pt will be independent with initial HEP for improved strength, balance, transfers and gait. Baseline: Goal status: INITIAL  2.  miniBEST to be assessed with LTG written.  Baseline: 20/28 Goal status: MET  3.  Pt will improve 5x sit<>stand to less than or equal to 18  sec to demonstrate improved functional strength and transfer efficiency.   Baseline: 21.4 seconds with no UE support, last rep pt with an episode of retropulsion   15.4 seconds with no UE support  Goal status: MET    LONG TERM GOALS: Target date: 03/21/2024 Updated goal date to reflect POC/last appt date: 03/30/24  Pt will verbalize understanding of local PD community resources, including fitness post DC.  Baseline:  Goal status: IN PROGRESS  2.  Pt will improve miniBEST to at least a 23/28 in order to demo decr fall risk.  Baseline: 20/28; 19/28 (9/4) Goal status: NOT MET   3.  Pt will perform cog TUG in 18 seconds or less in order to demo improved dual tasking.  Baseline: 22 seconds; 21.4s (9/4) Goal status: IN PROGRESS   4.  Pt will improve gait speed with no AD vs. LRAD to at least 3.1 ft/sec in order to demo improved community mobility.   Baseline: 11.5 seconds with no AD = 2.85 ft/sec  10.9 seconds with no AD = 3.0 ft/sec (8/25) 10.22s w/no AD = 3.21 ft/s no AD (9/4)  Goal status: MET   5.  Pt will improve 5x sit<>stand to less than or equal to 16 sec to demonstrate improved functional strength and transfer efficiency.  Baseline: 21.4 seconds with no UE support, last rep pt with an episode of retropulsion   14.6 seconds with no UE support (8/25) Goal status: MET   NEW LONG TERM GOALS:  Target date: 04/27/2024  Pt will verbalize understanding of local PD community resources, including fitness post DC.  Baseline:  Goal status: IN PROGRESS  2.   Pt will perform cog TUG in 18 seconds or less in order to demo improved dual tasking.  Baseline: 22 seconds; 21.4s (9/4) Goal status: IN PROGRESS    ASSESSMENT:  CLINICAL IMPRESSION: Today's skilled session focused on working on balance strategies with stepping, weight shifting, SLS stability primarily on compliant surfaces. With side stepping on a foam beam, pt needing min A for balance due to LOB posteriorly when  performing with no UE support. Pt able to maintain balance with posterior stepping at times. Worked on anterior weight shifting as this was challenging for pt based on miniBEST with pt needing intermittent UE  support for balance with this. Will continue per POC.    OBJECTIVE IMPAIRMENTS: Abnormal gait, decreased activity tolerance, decreased balance, decreased cognition, decreased coordination, decreased endurance, decreased mobility, difficulty walking, decreased strength, decreased safety awareness, hypomobility, impaired sensation, and postural dysfunction.   ACTIVITY LIMITATIONS: carrying, lifting, bending, stairs, transfers, and locomotion level  PARTICIPATION LIMITATIONS: driving, shopping, community activity, and yard work  PERSONAL FACTORS: Age, Behavior pattern, Past/current experiences, Time since onset of injury/illness/exacerbation, and 3+ comorbidities: s/p L3-5 prone trans-psoas approach for lateral interbody fusion; R L4-5 facetectomy and posterior segmental instrumentation L3-5 (12/2023). PMH significant for PD (complicated by tremor, gait impairment, REM sleep behavior and cognitive impairment ), HTN, DM, gout, glaucoma, PVD, orthostatic hypotension  are also affecting patient's functional outcome.   REHAB POTENTIAL: Good  CLINICAL DECISION MAKING: Evolving/moderate complexity  EVALUATION COMPLEXITY: Moderate  PLAN:  PT FREQUENCY: 2x/week  PT DURATION: 8 weeks - anticipate just 6 weeks   PLANNED INTERVENTIONS: [id number]- PT Re-evaluation, 97750- Physical Performance Testing, 97110-Therapeutic exercises, 97530- Therapeutic activity, [id number]- Neuromuscular re-education, 97535- Self Care, [id number]- Manual therapy, [phone]- Gait training, Patient/Family education, Balance training, Stair training, and DME instructions  PLAN FOR NEXT SESSION: check BP! stepping strategies, SLS,  Cog dual tasking with balance tasks, toe walking  Plan to D/C at end of current appts, needs 10th visit  PN   Sheffield LOISE Senate, PT, DPT 04/06/2024, 1:14 PM

## 2024-04-06 NOTE — Therapy (Addendum)
 OUTPATIENT OCCUPATIONAL THERAPY PARKINSON'S TREATMENT & DISCHARGE  Patient Name: [name] MRN: [id number] DOB:August 05, 1950, 73 y.o., male Today's Date: 04/06/2024 OCCUPATIONAL THERAPY DISCHARGE SUMMARY  Visits from Start of Care: 13  Current functional level related to goals / functional outcomes: Patient has met 4/5 short-term goals and 3/6 long-term goals to date.    Remaining deficits: Pt remains impacted by PD but has strategies to minimize affects of these symptoms on occupational performance.   Education / Equipment: Continue with HEP and strategies following OT d/c to maximize function and maximize safety and overall level of independence.    Patient agrees to discharge. Patient goals were partially met. Patient is being discharged due to being pleased with the current functional level.[name]    PCP: [name] BROCKS, MD REFERRING PROVIDER: Caleen Dirks, MD  END OF SESSION:  OT End of Session - 04/06/24 1352     Visit Number 13    Number of Visits 16    Date for OT Re-Evaluation 04/10/24    Authorization Type [employer] 2025 - Auth required    Authorization Time Period Auth# [id number] 09/28/23 to 11/09/23 [employer] 2025    Progress Note Due on Visit 20    OT Start Time 1156    OT Stop Time 1242    OT Time Calculation (min) 46 min    Activity Tolerance Patient tolerated treatment well    Behavior During Therapy WFL for tasks assessed/performed          Past Medical History:  Diagnosis Date   Allergy    seasonal   Anxiety    Arthritis    BPH (benign prostatic hyperplasia)    Complication of anesthesia    Pt. stated he had a reaction that ended in him requiring urinary cath placement; hx delirium worsening parkinson symptoms   Depression    Diverticulosis    GERD (gastroesophageal reflux disease)    esophageal spasms   Glaucoma    Gout    Head injury, closed, with concussion    Hepatitis C    chronic - Has been treated with Harvoni   HLD  (hyperlipidemia)    statin intolerant (Crestor  & Simvastatin ) - Taking Livalo  1mg  / week   Hypertension    Neuromuscular disorder (HCC)    Parkinson's Disease   Parkinson's disease (HCC)    Plantar fasciitis    right   PVD (peripheral vascular disease) (HCC)    With no claudication; only mild abdominal aortic atherosclerosis noted on ultrasound.   Sleep apnea    Wears CPAP nightly   Spinal stenosis of lumbar region    Thoracic ascending aortic aneurysm (HCC)    4.2 cm ascending TAA 09/2016 CT, 1 yr f/u rec   Ulcer    Past Surgical History:  Procedure Laterality Date   ANTERIOR LAT LUMBAR FUSION N/A 12/31/2023   Procedure: Prone trans-psoas interbody fusion - Lumbar three-Lumbar four - Lumbar four-Lumbar five, right facetectomy Lumbar four-five;  Surgeon: Jesus Pupa, MD;  Location: [facility];  Service: Neurosurgery;  Laterality: N/A;   BUNIONECTOMY WITH WEIL OSTEOTOMY Right 11/02/2019   Procedure: Right Foot Lapidus, Modified McBride Bunionectomy,  Hallux Akin Osteotomy;  Surgeon: Jesus Norleen, MD;  Location: [facility];  Service: Orthopedics;  Laterality: Right;   CARDIAC CATHETERIZATION  2005   30% Cx. Dr. Lavon   cataract surgery Left 11/05/2014   COLONOSCOPY     COLONOSCOPY N/A 01/09/2021   Procedure: COLONOSCOPY;  Surgeon: Jesus Jasper, MD;  Location: [facility];  Service: Gastroenterology;  Laterality: N/A;   ESOPHAGOGASTRODUODENOSCOPY N/A 05/02/2015   Procedure: ESOPHAGOGASTRODUODENOSCOPY (EGD);  Surgeon: Jesus Bunk, MD;  Location: [facility];  Service: Endoscopy;  Laterality: N/A;   ESOPHAGOGASTRODUODENOSCOPY  05/02/2015   no source of pt chest pain endoscopically evident. small hiatal hernia.   ESOPHAGOGASTRODUODENOSCOPY (EGD) WITH PROPOFOL  N/A 01/02/2021   Procedure: ESOPHAGOGASTRODUODENOSCOPY (EGD) WITH PROPOFOL ;  Surgeon: Jesus Kitchens, MD;  Location: [facility];  Service: Endoscopy;  Laterality: N/A;   HARDWARE REMOVAL Right 11/02/2019    Procedure: Second Metatarsal Removal of Deep Implant and Rotational Osteotomy;  Surgeon: Jesus Rush, MD;  Location: [facility];  Service: Orthopedics;  Laterality: Right;   IR ANGIOGRAM SELECTIVE EACH ADDITIONAL VESSEL  01/04/2021   IR ANGIOGRAM SELECTIVE EACH ADDITIONAL VESSEL  01/04/2021   IR ANGIOGRAM VISCERAL SELECTIVE  01/04/2021   IR US  GUIDE VASC ACCESS RIGHT  01/04/2021   LUMBAR LAMINECTOMY/DECOMPRESSION MICRODISCECTOMY N/A 07/03/2021   Procedure: Lumbar three through five decompression with lumbar three through five insitu fusion;  Surgeon: Jesus Aures, MD;  Location: [facility];  Service: Orthopedics;  Laterality: N/A;   LUMBAR PERCUTANEOUS PEDICLE SCREW 2 LEVEL N/A 12/31/2023   Procedure: LUMBAR PERCUTANEOUS PEDICLE SCREW LUMBAR THREE-LUMBAR FIVE;  Surgeon: Jesus Pupa, MD;  Location: [facility];  Service: Neurosurgery;  Laterality: N/A;   MEMBRANE PEEL Left 03/14/2014   Procedure: MEMBRANE PEEL; ENDOLASER;  Surgeon: Jesus DELENA Ruder, MD;  Location: [facility];  Service: Ophthalmology;  Laterality: Left;   NM MYOVIEW  LTD  10/2015   LOW RISK. Small, fixed basal lateral defect - likely diaphragmatic attenuation. EF 69%   PARS PLANA VITRECTOMY Left 03/14/2014   Procedure: PARS PLANA VITRECTOMY WITH 25 GAUGE;  Surgeon: Jesus DELENA Ruder, MD;  Location: [facility] OR;  Service: Ophthalmology;  Laterality: Left;   shave  02/03/2022   angiofibroma   TONSILLECTOMY     TRANSTHORACIC ECHOCARDIOGRAM  01/07/2021   Normal EF 60 to 65%.  No R WMA.  GRII DD-moderately dilated LA..  Mildly dilated RV but normal function.  Normal RAP/CVP.  Trivial AI with mild to moderate sclerosis-no stenosis   UMBILICAL HERNIA REPAIR  2023   UPPER GASTROINTESTINAL ENDOSCOPY     WEIL OSTEOTOMY Right 09/01/2017   Procedure: RIGHT GREAT TOE CHEVRON AND WEIL OSTEOTOMY 2ND METATARSAL;  Surgeon: Jesus Jerona GAILS, MD;  Location: [facility];  Service: Orthopedics;  Laterality: Right;   XI ROBOTIC ASSISTED VENTRAL HERNIA N/A  03/17/2022   Procedure: XI ROBOTIC ASSISTED VENTRAL HERNIA;  Surgeon: Jesus Schanz, MD;  Location: [facility];  Service: General;  Laterality: N/A;   Patient Active Problem List   Diagnosis Date Noted   Chronic hepatitis C (HCC) 03/09/2024   Malnutrition of moderate degree 01/06/2024   Spondylolisthesis at L4-L5 level 12/31/2023   Supine hypertension 12/03/2023   Parkinson's disease with neurogenic orthostatic hypotension (HCC) 12/03/2023   Preop cardiovascular exam 12/03/2023   Glaucoma 11/30/2023   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   Acquired hallux rigidus of right foot 11/23/2022   Ventral hernia without obstruction or gangrene    Primary open angle glaucoma of left eye, mild stage 06/23/2021   Facial numbness 12/24/2020   Neovascular glaucoma, right eye, stage unspecified 12/16/2020   Lumbar radiculopathy 11/25/2020   Gastroesophageal reflux disease 09/10/2020   Dry eyes, bilateral 08/15/2020   Presbycusis of right ear 01/17/2020   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   Secondary corneal edema of right eye 12/12/2019  Ptosis of right eyelid 12/12/2019   OAB (overactive bladder) 11/29/2019   Bunion of great toe of right foot 07/14/2017   Claw toe, acquired, right 07/14/2017   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   Medication management 10/09/2016   Plantar fasciitis of right foot 02/19/2015   Depression 08/14/2014   Family history of heart disease in male family member before age 40 04/26/2014   Mild cognitive impairment 03/13/2014   Parkinsonian tremor (HCC) 02/12/2014   Hyperlipidemia with target LDL less than 100    Abdominal aortic atherosclerosis (HCC)    Moderate essential hypertension 02/25/2011   Arthropathy 02/25/2011   HAV (hallux abducto valgus) 02/25/2011   ONSET DATE: 01/21/2024 (referral date)   REFERRING DIAG: M43.16 (ICD-10-CM) - Spondylolisthesis, lumbar region  THERAPY  DIAG:  Muscle weakness (generalized)  Other lack of coordination  Visuospatial deficit  Other low back pain  Tremor  Parkinson's disease without dyskinesia, with fluctuating manifestations (HCC)  Rationale for Evaluation and Treatment: Rehabilitation  SUBJECTIVE:   SUBJECTIVE STATEMENT: Pain in lower back, rated as a 3/10. Pt described the pain as a stinging pain that is on and off but sometimes a burning pain.   Pt accompanied by: self  PERTINENT HISTORY: recent back surgery (L3-5) on 12/31/23, Parkinson's disease causing autonomic dysregulation and orthostatic hypotension, leading to blood pressure fluctuations and dizziness upon standing. Lumbar radiculopathy (Chronic), PVD, HLD, spinal stenosis, BPH   Post-surgical recovery complicated by inappropriate medication management and inadequate rehabilitation, with delirium post-anesthesia likely exacerbated by Parkinson's disease.    PRECAUTIONS: Other: can take off back brace when seated or in bed and some walking in house, but wear it mostly when up. Back precautions (no BLT), no lifting over 10 lbs, orthostatic hypotension   WEIGHT BEARING RESTRICTIONS: No  PAIN:  Are you having pain? Back pain. 3/10 See subjective above.  FALLS: Has patient fallen in last 6 months? Yes. Number of falls 1  LIVING ENVIRONMENT: Lives with: lives with their spouse Lives in: 2 story home, but lives on first floor, 3 steps to enter Has following equipment at home: Vannie - 2 wheeled, shower chair, and Grab bars  PLOF: Needs assistance with ADLs prior to surgery, enjoys music and traveling  PATIENT GOALS: improve function and speed with ADLS including handwriting  OBJECTIVE:  Note: Objective measures were completed at Evaluation unless otherwise noted.  BP assessed and [facility] for therapy visit.   HAND DOMINANCE: Right  ADLs: Overall ADLs: supervision, assist for compression socks Transfers/ambulation related to ADLs:  independent Eating: independent - min spills Grooming: independent UB Dressing: independent (difficulty with jacket and getting shirt off overhead)  LB Dressing: assist for compression socks Toileting: independent Bathing: independent  Tub Shower transfers: close supervision Equipment: Shower seat without back and Grab bars  IADLs: dependent for medication management, cooking and cleaning, but pt can get cereal and/or snack, microwaveable items  Handwriting: 75% legible and Severe micrographia  MOBILITY STATUS: Independent  POSTURE COMMENTS:  increased lumbar lordosis, increased thoracic kyphosis, and posterior pelvic tilt   FUNCTIONAL OUTCOME MEASURES: Fastening/unfastening 3 buttons: 53 sec Physical performance test: PPT#2 (simulated eating) 27.21 & PPT#4 (donning/doffing jacket): 14.12 sec (w/ hospital gown)   COORDINATION: 9 Hole Peg test (Initial) : Right: 31.60 sec; Left: 41.77 sec Box and Blocks:  Right: 37 blocks; Left: 37 blocks  9 Hole Peg test: 03/30/2024 Right: 34.1, 30.2, 31.4 (all trials with glasses)  Average: 31.9  Left: 41.5 sec (without glasses)  38.6 sec (  with glasses), 37.9 (with glasses) Average: 39.3  9 Hole Peg test: 04/06/2024 Right: 35 seconds,31 seconds,29 seconds (all trials with glasses)  Average: 31.6  Left: 41 seconds, 37 seconds, 35 seconds (all trials with glasses) Average: 37.6  UE ROM:  WFL   SENSATION: Fingertips get cold   COGNITION: Overall cognitive status: Impaired and decreased memory, processing speed  VISION:  Pt/family reports can only see light and images from Rt eye (? Legally blind), glaucoma and decreased vision Lt eye  OBSERVATIONS: Bradykinesia, Hypokinesia, and decline in cognition and overall function since back surgery                                                                                                                   TREATMENT:  Assessed remaining LTG's (9 hole peg)  - see below. Pt had slight  improvement in L hand by 4 seconds but still remains at the same level of coordination for R hand with no noticeable improvements.  OT Re-iterated importance of doing HEP's daily to maintain PD and hopefully improve function.  Educated pt on need to return to therapy in 6 months for PD screening.    PATIENT EDUCATION: Education details: see above Person educated: Patient Education method: Explanation, Demonstration, and Verbal cues Education comprehension: verbalized understanding, returned demonstration, and verbal cues required   HOME EXERCISE PROGRAM: 02/17/2024: handwriting changes 02/24/2024: new behind head bag exercise handout 03/07/2024: Exercise Chart 03/14/2024: golf solitaire; coordination HEP 03/16/2024: Buttoning and jacket strategies 03/20/24: tremor strategies 03/22/24: ways to prevent future PD related complications and memory strategies 03/28/24: ways to keep thinking skills sharp  GOALS: Goals reviewed with patient? Yes  SHORT TERM GOALS: Target date: 03/10/24  Independent with PD specific HEP (Will need to adapt d/t current back precautions, consider adding bag ex's for ADLS) Baseline: Goal status: MET  2.  Pt to write name with 90% or greater legibility with only min micrographia Baseline:  Goal status: MET   3. Pt will demonstrate improved ease with feeding as evidenced by decreasing PPT#2 by 8 secs or more Baseline: 27.21 sec Goal status: MET (03/20/24: 16 sec. Using white bowl d/t visual deficits)  4.  Pt will demonstrate improved ease with fastening buttons as evidenced by decreasing 3 button/unbutton time by 5 seconds or more Baseline: 53 sec Goal status: MET (03/20/24 = 32.46 sec w/ shirt ON d/t decreased vision)   5.  Pt will be able to place at least 3 additional blocks bilaterally with completion of Box and Blocks test. Baseline: Right: 37 blocks; Left: 37 blocks Goal status: NOT MET (04/03/24: RT = 35, LT = 33)   LONG TERM GOALS: Target date:  04/10/24  Pt will verbalize understanding of ways to prevent future PD related complications and appropriate community resources prn  Baseline:  Goal status: MET  2.  Pt will verbalize understanding of ways to keep thinking skills sharp and ways to compensate for STM changes in the future  Baseline:  Goal status: MET  3.  Pt will verbalize understanding of adaptive strategies to increase ease with ADLS/IADLS   Baseline:  Goal status: MET  4.  Pt will write a sentence with no significant decrease in size and maintain 90% legibility  Baseline:  Goal status: NOT MET (approx 75%, however can achieve goal using graph paper)   5.  Pt will demonstrate improved fine motor coordination for ADLs as evidenced by decreasing 9 hole peg test score for Lt hand by 6 secs  Baseline: 41.77 sec. 04/06/2024: 37.6 sec.  Goal status: NOT MET  6.  Pt will demonstrate improved fine motor coordination for ADLs as evidenced by decreasing 9 hole peg test score for Rt hand by 3 secs  Baseline: 31.60 sec 04/06/2024: 31.6 sec.  Goal status: NOT MET  ASSESSMENT: CLINICAL IMPRESSION:  Pt educated to continue with HEP following OT d/c for further remediation efforts as needed until re-evaluation in 6 months. Patient is appropriate for discharge and no longer demonstrates medical necessity for continued skilled occupational therapy services.     PLAN:  OT discharge completed  CONSULTED AND AGREED WITH PLAN OF CARE: Patient  Levon Boettcher, Student-OT 04/06/2024, 3:28 PM

## 2024-04-10 ENCOUNTER — Encounter: Payer: Self-pay | Admitting: Speech Pathology

## 2024-04-10 DIAGNOSIS — H04123 Dry eye syndrome of bilateral lacrimal glands: Secondary | ICD-10-CM | POA: Diagnosis not present

## 2024-04-10 DIAGNOSIS — H02401 Unspecified ptosis of right eyelid: Secondary | ICD-10-CM | POA: Diagnosis not present

## 2024-04-10 DIAGNOSIS — H401123 Primary open-angle glaucoma, left eye, severe stage: Secondary | ICD-10-CM | POA: Diagnosis not present

## 2024-04-10 DIAGNOSIS — H4089 Other specified glaucoma: Secondary | ICD-10-CM | POA: Diagnosis not present

## 2024-04-10 DIAGNOSIS — H34832 Tributary (branch) retinal vein occlusion, left eye, with macular edema: Secondary | ICD-10-CM | POA: Diagnosis not present

## 2024-04-10 DIAGNOSIS — H18231 Secondary corneal edema, right eye: Secondary | ICD-10-CM | POA: Diagnosis not present

## 2024-04-11 ENCOUNTER — Other Ambulatory Visit (HOSPITAL_COMMUNITY): Payer: Self-pay

## 2024-04-11 MED ORDER — COVID-19 MRNA VAC-TRIS(PFIZER) 30 MCG/0.3ML IM SUSY
0.3000 mL | PREFILLED_SYRINGE | Freq: Once | INTRAMUSCULAR | 0 refills | Status: AC
  Filled 2024-04-11: qty 0.3, 1d supply, fill #0

## 2024-04-11 MED ORDER — FLUZONE HIGH-DOSE 0.5 ML IM SUSY
0.5000 mL | PREFILLED_SYRINGE | Freq: Once | INTRAMUSCULAR | 0 refills | Status: AC
  Filled 2024-04-11: qty 0.5, 1d supply, fill #0

## 2024-04-11 NOTE — Progress Notes (Unsigned)
 [name]

## 2024-04-12 ENCOUNTER — Other Ambulatory Visit: Payer: Self-pay | Admitting: Family Medicine

## 2024-04-12 ENCOUNTER — Encounter: Payer: Self-pay | Admitting: Adult Health

## 2024-04-12 ENCOUNTER — Other Ambulatory Visit (HOSPITAL_COMMUNITY): Payer: Self-pay

## 2024-04-12 ENCOUNTER — Ambulatory Visit: Admitting: Adult Health

## 2024-04-12 ENCOUNTER — Other Ambulatory Visit: Payer: Self-pay

## 2024-04-12 ENCOUNTER — Encounter: Payer: Self-pay | Admitting: Speech Pathology

## 2024-04-12 ENCOUNTER — Ambulatory Visit: Admitting: Speech Pathology

## 2024-04-12 VITALS — BP 110/65 | HR 77 | Ht 69.0 in | Wt 147.0 lb

## 2024-04-12 DIAGNOSIS — R471 Dysarthria and anarthria: Secondary | ICD-10-CM | POA: Diagnosis not present

## 2024-04-12 DIAGNOSIS — G4733 Obstructive sleep apnea (adult) (pediatric): Secondary | ICD-10-CM

## 2024-04-12 DIAGNOSIS — M5459 Other low back pain: Secondary | ICD-10-CM | POA: Diagnosis not present

## 2024-04-12 DIAGNOSIS — R1312 Dysphagia, oropharyngeal phase: Secondary | ICD-10-CM | POA: Diagnosis not present

## 2024-04-12 DIAGNOSIS — M6281 Muscle weakness (generalized): Secondary | ICD-10-CM | POA: Diagnosis not present

## 2024-04-12 DIAGNOSIS — G20A1 Parkinson's disease without dyskinesia, without mention of fluctuations: Secondary | ICD-10-CM

## 2024-04-12 DIAGNOSIS — Z789 Other specified health status: Secondary | ICD-10-CM | POA: Diagnosis not present

## 2024-04-12 DIAGNOSIS — R2681 Unsteadiness on feet: Secondary | ICD-10-CM | POA: Diagnosis not present

## 2024-04-12 DIAGNOSIS — R251 Tremor, unspecified: Secondary | ICD-10-CM | POA: Diagnosis not present

## 2024-04-12 DIAGNOSIS — R293 Abnormal posture: Secondary | ICD-10-CM | POA: Diagnosis not present

## 2024-04-12 DIAGNOSIS — R278 Other lack of coordination: Secondary | ICD-10-CM | POA: Diagnosis not present

## 2024-04-12 DIAGNOSIS — R29818 Other symptoms and signs involving the nervous system: Secondary | ICD-10-CM | POA: Diagnosis not present

## 2024-04-12 DIAGNOSIS — G20A2 Parkinson's disease without dyskinesia, with fluctuations: Secondary | ICD-10-CM

## 2024-04-12 MED ORDER — CARBIDOPA-LEVODOPA 25-100 MG PO TABS
1.5000 | ORAL_TABLET | Freq: Four times a day (QID) | ORAL | 3 refills | Status: AC
  Filled 2024-04-12 – 2024-04-15 (×4): qty 540, 90d supply, fill #0
  Filled 2024-07-09 – 2024-07-10 (×2): qty 540, 90d supply, fill #1
  Filled 2024-07-12: qty 180, 30d supply, fill #1
  Filled 2024-08-08 – 2024-08-09 (×2): qty 180, 30d supply, fill #2

## 2024-04-12 MED ORDER — CITALOPRAM HYDROBROMIDE 20 MG PO TABS
20.0000 mg | ORAL_TABLET | Freq: Every evening | ORAL | 1 refills | Status: AC
  Filled 2024-04-12: qty 90, 90d supply, fill #0
  Filled 2024-08-08: qty 30, 30d supply, fill #2
  Filled ????-??-??: fill #1

## 2024-04-12 NOTE — Therapy (Signed)
 OUTPATIENT SPEECH LANGUAGE PATHOLOGY PARKINSON'S TREATMENT   Patient Name: [name] MRN: [id number] DOB:Aug 31, 1950, 73 y.o., male Today's Date: 04/12/2024  PCP: [name], MD REFERRING PROVIDER: [name], NP  END OF SESSION:  End of Session - 04/12/24 0934     Visit Number 2    Number of Visits 13    Date for SLP Re-Evaluation 06/27/24    Authorization Type [employer] Medicare - requested auth    SLP Start Time [phone]    SLP Stop Time  1015    SLP Time Calculation (min) 41 min    Activity Tolerance Patient tolerated treatment well          Past Medical History:  Diagnosis Date   Allergy    seasonal   Anxiety    Arthritis    BPH (benign prostatic hyperplasia)    Complication of anesthesia    Pt. stated he had a reaction that ended in him requiring urinary cath placement; hx delirium worsening parkinson symptoms   Depression    Diverticulosis    GERD (gastroesophageal reflux disease)    esophageal spasms   Glaucoma    Gout    Head injury, closed, with concussion    Hepatitis C    chronic - Has been treated with Harvoni   HLD (hyperlipidemia)    statin intolerant (Crestor  & Simvastatin ) - Taking Livalo  1mg  / week   Hypertension    Neuromuscular disorder (HCC)    Parkinson's Disease   Parkinson's disease (HCC)    Plantar fasciitis    right   PVD (peripheral vascular disease) (HCC)    With no claudication; only mild abdominal aortic atherosclerosis noted on ultrasound.   Sleep apnea    Wears CPAP nightly   Spinal stenosis of lumbar region    Thoracic ascending aortic aneurysm (HCC)    4.2 cm ascending TAA 09/2016 CT, 1 yr f/u rec   Ulcer    Past Surgical History:  Procedure Laterality Date   ANTERIOR LAT LUMBAR FUSION N/A 12/31/2023   Procedure: Prone trans-psoas interbody fusion - Lumbar three-Lumbar four - Lumbar four-Lumbar five, right facetectomy Lumbar four-five;  Surgeon: [name], MD;  Location: [facility];  Service: Neurosurgery;   Laterality: N/A;   BUNIONECTOMY WITH WEIL OSTEOTOMY Right 11/02/2019   Procedure: Right Foot Lapidus, Modified McBride Bunionectomy,  Hallux Akin Osteotomy;  Surgeon: [name], MD;  Location: [facility];  Service: Orthopedics;  Laterality: Right;   CARDIAC CATHETERIZATION  2005   30% Cx. Dr. Lavon   cataract surgery Left 11/05/2014   COLONOSCOPY     COLONOSCOPY N/A 01/09/2021   Procedure: COLONOSCOPY;  Surgeon: [name], MD;  Location: [facility];  Service: Gastroenterology;  Laterality: N/A;   ESOPHAGOGASTRODUODENOSCOPY N/A 05/02/2015   Procedure: ESOPHAGOGASTRODUODENOSCOPY (EGD);  Surgeon: [name], MD;  Location: [facility];  Service: Endoscopy;  Laterality: N/A;   ESOPHAGOGASTRODUODENOSCOPY  05/02/2015   no source of pt chest pain endoscopically evident. small hiatal hernia.   ESOPHAGOGASTRODUODENOSCOPY (EGD) WITH PROPOFOL  N/A 01/02/2021   Procedure: ESOPHAGOGASTRODUODENOSCOPY (EGD) WITH PROPOFOL ;  Surgeon: [name], MD;  Location: [facility];  Service: Endoscopy;  Laterality: N/A;   HARDWARE REMOVAL Right 11/02/2019   Procedure: Second Metatarsal Removal of Deep Implant and Rotational Osteotomy;  Surgeon: [name], MD;  Location: [facility];  Service: Orthopedics;  Laterality: Right;   IR ANGIOGRAM SELECTIVE EACH ADDITIONAL VESSEL  01/04/2021   IR ANGIOGRAM SELECTIVE EACH ADDITIONAL VESSEL  01/04/2021   IR ANGIOGRAM  VISCERAL SELECTIVE  01/04/2021   IR US  GUIDE VASC ACCESS RIGHT  01/04/2021   LUMBAR LAMINECTOMY/DECOMPRESSION MICRODISCECTOMY N/A 07/03/2021   Procedure: Lumbar three through five decompression with lumbar three through five insitu fusion;  Surgeon: [name], MD;  Location: [facility];  Service: Orthopedics;  Laterality: N/A;   LUMBAR PERCUTANEOUS PEDICLE SCREW 2 LEVEL N/A 12/31/2023   Procedure: LUMBAR PERCUTANEOUS PEDICLE SCREW LUMBAR THREE-LUMBAR FIVE;  Surgeon: [name], MD;  Location: [facility];  Service:  Neurosurgery;  Laterality: N/A;   MEMBRANE PEEL Left 03/14/2014   Procedure: MEMBRANE PEEL; ENDOLASER;  Surgeon: [name], MD;  Location: [facility];  Service: Ophthalmology;  Laterality: Left;   NM MYOVIEW  LTD  10/2015   LOW RISK. Small, fixed basal lateral defect - likely diaphragmatic attenuation. EF 69%   PARS PLANA VITRECTOMY Left 03/14/2014   Procedure: PARS PLANA VITRECTOMY WITH 25 GAUGE;  Surgeon: [name], MD;  Location: [facility];  Service: Ophthalmology;  Laterality: Left;   shave  02/03/2022   angiofibroma   TONSILLECTOMY     TRANSTHORACIC ECHOCARDIOGRAM  01/07/2021   Normal EF 60 to 65%.  No R WMA.  GRII DD-moderately dilated LA..  Mildly dilated RV but normal function.  Normal RAP/CVP.  Trivial AI with mild to moderate sclerosis-no stenosis   UMBILICAL HERNIA REPAIR  2023   UPPER GASTROINTESTINAL ENDOSCOPY     WEIL OSTEOTOMY Right 09/01/2017   Procedure: RIGHT GREAT TOE CHEVRON AND WEIL OSTEOTOMY 2ND METATARSAL;  Surgeon: [name], MD;  Location: [facility];  Service: Orthopedics;  Laterality: Right;   XI ROBOTIC ASSISTED VENTRAL HERNIA N/A 03/17/2022   Procedure: XI ROBOTIC ASSISTED VENTRAL HERNIA;  Surgeon: [name], MD;  Location: [facility];  Service: General;  Laterality: N/A;   Patient Active Problem List   Diagnosis Date Noted   Chronic hepatitis C (HCC) 03/09/2024   Malnutrition of moderate degree 01/06/2024   Spondylolisthesis at L4-L5 level 12/31/2023   Supine hypertension 12/03/2023   Parkinson's disease with neurogenic orthostatic hypotension (HCC) 12/03/2023   Preop cardiovascular exam 12/03/2023   Glaucoma 11/30/2023   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   Acquired hallux rigidus of right foot 11/23/2022   Ventral hernia without obstruction or gangrene    Primary open angle glaucoma of left eye, mild stage 06/23/2021   Facial numbness 12/24/2020   Neovascular glaucoma, right eye, stage unspecified 12/16/2020   Lumbar radiculopathy 11/25/2020    Gastroesophageal reflux disease 09/10/2020   Dry eyes, bilateral 08/15/2020   Presbycusis of right ear 01/17/2020   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   Secondary corneal edema of right eye 12/12/2019   Ptosis of right eyelid 12/12/2019   OAB (overactive bladder) 11/29/2019   Bunion of great toe of right foot 07/14/2017   Claw toe, acquired, right 07/14/2017   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   Medication management 10/09/2016   Plantar fasciitis of right foot 02/19/2015   Depression 08/14/2014   Family history of heart disease in male family member before age 64 04/26/2014   Mild cognitive impairment 03/13/2014   Parkinsonian tremor (HCC) 02/12/2014   Hyperlipidemia with target LDL less than 100    Abdominal aortic atherosclerosis (HCC)    Moderate essential hypertension 02/25/2011   Arthropathy 02/25/2011   HAV (hallux abducto valgus) 02/25/2011    ONSET DATE: 02/08/24 (referral date)  REFERRING DIAG: G20.A2 (ICD-10-CM) - Parkinson's disease without dyskinesia, with fluctuating manifestations (HCC)  THERAPY  DIAG:  Dysarthria and anarthria  Rationale for Evaluation and Treatment: Rehabilitation  SUBJECTIVE:   SUBJECTIVE STATEMENT: I can't remember what she said or what I was saying Pt accompanied by: significant other Bea  PERTINENT HISTORY: recent back surgery (L3-5) on 12/31/23, Parkinson's disease causing autonomic dysregulation and orthostatic hypotension, leading to blood pressure fluctuations and dizziness upon standing. Lumbar radiculopathy (Chronic), PVD, HLD, spinal stenosis, BPH   Post-surgical recovery complicated by inappropriate medication management and inadequate rehabilitation, with delirium post-anesthesia likely exacerbated by Parkinson's disease.     PAIN:  Are you having pain? No   PATIENT GOALS: To remember what was said                                                                                                                              TREATMENT DATE:   Next session - personally relevant phrases re: family members  03/1724: Garrel received his SPEAK OUT! Booklet. They have watched the What is Parkinson's video and will have their children watch it as well.  Targeted volume and intelligibility using Speak Out! Lesson 2. Pt required initial usual mod verbal cues, modeling, for volume and breath support, fading to occasional min A.  Pt averages the following volume levels:  Sustained AH: 84dB  Counting:80dB  Reading (phrases): 75dB  Cognitive Exercise: 72dB with frequent mod A Required  verbal cues, modeling for carryover of intent /volume answering simple questions following structured practice. Generated list of personally relevant phrases to practice with intent, including name, number DOB, address, restaurant orders and script explaining that he has PD and may have trouble speaking- See Patient instructions   04/04/24: Evaluation completed. Introduced [employer] - ordered [employer] OUT! Booklet and [employer]. Demonstrated navigating website to locate What is Parkinsons video and home practice sessions. Initiated SPEAK OUT! Lesson 1 to  target volume, intelligibility and speaking with intent as well as cognitive communication exercises. With usual min to mod A, [name] averaged 79dB on sustained Ah, 76dB on glides, 78dB counting and 75dB reading. In conversation/cognitive exercise, with verbal cues and modeling, he averaged 71dB. Instructed them to complete Lesson one again in [employer], as well at to complete home practice sessions on website.    PATIENT EDUCATION: Education details: HEP for dysarthria, swallow precautions, compensations for slow processing and following conversation Person educated: Patient and Spouse Education method: Programmer, multimedia, Facilities manager, Verbal cues, and Handouts Education comprehension: verbal cues  required and needs further education  HOME EXERCISE PROGRAM: SPEAK OUT!   GOALS: Goals reviewed with patient? Yes  SHORT TERM GOALS: Target date: 05/29/24 (extended due to scheduling)  Pt will complete HEP for dysarthria with occasional min A over 2 sessions Baseline: Goal status: ONGOING  2.  Pt will average 74dB 18/20 sentences Baseline:  Goal status: ONGOING  3.  Pt will follow swallow precautions with occasional min A Baseline:  Goal status: ONGOING  4.  Pt and spouse will carryover  2 compensatory strategies to support slow processing  Baseline:  Goal status: ONGOING  5.  Pt will average 72dB over 8 minute conversation Baseline:  Goal status: ONGOING   LONG TERM GOALS: Target date: 06/27/24  Pt will complete HEP with rare min A over 2 sessions Baseline:  Goal status: ONGOING  2.  Pt will complete 3 online home practice sessions Baseline:  Goal status: ONGOING  3.  Pt will access [employer] and complete 3 lessons over 2 weeks with min A from spouse Baseline:  Goal status: ONGOING  4.  Pt will report no episodes of globus sensation with pills Baseline:  Goal status: ONGOING  5.  Pt will average 72dB over 15 minute conversation with rare min A Baseline:  Goal status: ONGOING  6.  Pt and spouse will carry over 4 compensatory strategies to support slow processing in conversation and directions Baseline:  Goal status:ONGOING  ASSESSMENT:  CLINICAL IMPRESSION: Patient is a 73 y.o. 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 They report that Garrel freezes when speaking, has episodes of dysfluency and looses the topic.Bea reports slow follow through  and difficulty following more than one direction at a time. Garrel endorses difficulty maintaining his train of thought and following conversations. He is using a calendar to recall appointments, he is using the microwave and coffee  maker independently. Bea reports increased difficulty using their remote control system and that she tells him to put out the dog, and he forgets to let the dog back in. Overall volume low affecting intelligibility. Garrel passed Yale swallow test, however he reports pills get stuck and he is having more difficulty swallowing solids. Will monitor and order MBSS if needed. I recommend skilled ST to maximize intelligibility, safety of swallow and cognition for safety, to reduce caregiver burden and improve participation in conversations for QOL. .   OBJECTIVE IMPAIRMENTS: Objective impairments include attention, memory, awareness, executive functioning, dysarthria, and dysphagia. These impairments are limiting patient from managing medications, managing appointments, managing finances, household responsibilities, ADLs/IADLs, effectively communicating at home and in community, and safety when swallowing.Factors affecting potential to achieve goals and functional outcome are ability to learn/carryover information and medical prognosis.. Patient will benefit from skilled SLP services to address above impairments and improve overall function.  REHAB POTENTIAL: Good  PLAN:  SLP FREQUENCY: 1-2x/week  SLP DURATION: 12 weeks  PLANNED INTERVENTIONS: Aspiration precaution training, Pharyngeal strengthening exercises, Diet toleration management , Language facilitation, Environmental controls, Trials of upgraded texture/liquids, Cueing hierachy, Cognitive reorganization, Internal/external aids, Functional tasks, Multimodal communication approach, SLP instruction and feedback, Compensatory strategies, Patient/family education, [phone] Treatment of speech (30 or 45 min) , [id number] Treatment of swallowing function, [phone]- Speech [address], Artic, Phon, Eval Compre, Express, and MBSS    Adrieana Fennelly, Leita Caldron, CCC-SLP 04/12/2024, 11:52 AM

## 2024-04-12 NOTE — Patient Instructions (Signed)
  [name]  June 25, 2051  [phone]  [address] [place] [address]  I have Parkinson's and I may have some trouble talking - please be patient with me  I have Parkinson's and I may be a little slower - please be patient with me  Can you say that again? I didn't get it all  Can you talk a little slower please - I need time to understand  Let's go to [place] [employer]  I'll have a Philly Chicken with banana peppers, please  [employer]  Our order is to go.  I'll have the baked spaghetti with side [place] salad and chocolate cake please  [facility]  10 fried wings and 2 shrimp rolls please

## 2024-04-12 NOTE — Patient Instructions (Addendum)
 Your Plan:  Continue Sinemet  but will adjust dosage  Week 1 - 1.5 tabs 6-7am, 1.5 tab 10-11am, 1 tab 2-3pm and 1.5 tab 6-7pm Week 2 - increase to 1.5 tabs four times daily (at times noted above)  Continue Artane  1mg  BID   Continue Exelon  4.5mg  twice daily   Continue working with therapy as scheduled  Please let me know if you are interested in pursuing neuro ophthalmology   Will request [employer] follow up with you regarding change of mask type - if you continue to have difficulty tolerating after 3-4 months, please let me know     Follow up in 6 months or call earlier if needed     Thank you for coming to see us  at [facility]. I hope we have been able to provide you high quality care today.  You may receive a patient satisfaction survey over the next few weeks. We would appreciate your feedback and comments so that we may continue to improve ourselves and the health of our patients.

## 2024-04-13 ENCOUNTER — Encounter: Payer: Self-pay | Admitting: Physical Therapy

## 2024-04-13 ENCOUNTER — Ambulatory Visit: Admitting: Physical Therapy

## 2024-04-13 ENCOUNTER — Other Ambulatory Visit (HOSPITAL_COMMUNITY): Payer: Self-pay

## 2024-04-13 VITALS — BP 92/55 | HR 64

## 2024-04-13 DIAGNOSIS — R471 Dysarthria and anarthria: Secondary | ICD-10-CM | POA: Diagnosis not present

## 2024-04-13 DIAGNOSIS — R278 Other lack of coordination: Secondary | ICD-10-CM | POA: Diagnosis not present

## 2024-04-13 DIAGNOSIS — G20A2 Parkinson's disease without dyskinesia, with fluctuations: Secondary | ICD-10-CM | POA: Diagnosis not present

## 2024-04-13 DIAGNOSIS — R293 Abnormal posture: Secondary | ICD-10-CM | POA: Diagnosis not present

## 2024-04-13 DIAGNOSIS — R251 Tremor, unspecified: Secondary | ICD-10-CM | POA: Diagnosis not present

## 2024-04-13 DIAGNOSIS — R29818 Other symptoms and signs involving the nervous system: Secondary | ICD-10-CM

## 2024-04-13 DIAGNOSIS — M5459 Other low back pain: Secondary | ICD-10-CM | POA: Diagnosis not present

## 2024-04-13 DIAGNOSIS — M6281 Muscle weakness (generalized): Secondary | ICD-10-CM

## 2024-04-13 DIAGNOSIS — R2681 Unsteadiness on feet: Secondary | ICD-10-CM | POA: Diagnosis not present

## 2024-04-13 DIAGNOSIS — R1312 Dysphagia, oropharyngeal phase: Secondary | ICD-10-CM | POA: Diagnosis not present

## 2024-04-13 NOTE — Therapy (Signed)
 OUTPATIENT PHYSICAL THERAPY NEURO TREATMENT   Patient Name: [name] MRN: [id number] DOB:1951/03/16, 73 y.o., male Today's Date: 04/13/2024   PCP: [name], MD   REFERRING PROVIDER: Caleen Dirks, MD (sent to Harlene Bogaert, NP)   END OF SESSION:  PT End of Session - 04/13/24 1147     Visit Number 10    Number of Visits 13    Date for Recertification  04/27/24   Recert   Authorization Type [employer] Medicare    PT Start Time 1146    PT Stop Time 1226    PT Time Calculation (min) 40 min    Equipment Utilized During Treatment --    Activity Tolerance Treatment limited secondary to medical complications (Comment)   limited by orthostatics/low BP   Behavior During Therapy WFL for tasks assessed/performed;Flat affect          Past Medical History:  Diagnosis Date   Allergy    seasonal   Anxiety    Arthritis    BPH (benign prostatic hyperplasia)    Complication of anesthesia    Pt. stated he had a reaction that ended in him requiring urinary cath placement; hx delirium worsening parkinson symptoms   Depression    Diverticulosis    GERD (gastroesophageal reflux disease)    esophageal spasms   Glaucoma    Gout    Head injury, closed, with concussion    Hepatitis C    chronic - Has been treated with Harvoni   HLD (hyperlipidemia)    statin intolerant (Crestor  & Simvastatin ) - Taking Livalo  1mg  / week   Hypertension    Neuromuscular disorder (HCC)    Parkinson's Disease   Parkinson's disease (HCC)    Plantar fasciitis    right   PVD (peripheral vascular disease) (HCC)    With no claudication; only mild abdominal aortic atherosclerosis noted on ultrasound.   Sleep apnea    Wears CPAP nightly   Spinal stenosis of lumbar region    Thoracic ascending aortic aneurysm (HCC)    4.2 cm ascending TAA 09/2016 CT, 1 yr f/u rec   Ulcer    Past Surgical History:  Procedure Laterality Date   ANTERIOR LAT LUMBAR FUSION N/A 12/31/2023   Procedure: Prone trans-psoas  interbody fusion - Lumbar three-Lumbar four - Lumbar four-Lumbar five, right facetectomy Lumbar four-five;  Surgeon: [name], MD;  Location: [facility];  Service: Neurosurgery;  Laterality: N/A;   BUNIONECTOMY WITH WEIL OSTEOTOMY Right 11/02/2019   Procedure: Right Foot Lapidus, Modified McBride Bunionectomy,  Hallux Akin Osteotomy;  Surgeon: [name], MD;  Location: [facility];  Service: Orthopedics;  Laterality: Right;   CARDIAC CATHETERIZATION  2005   30% Cx. Dr. Lavon   cataract surgery Left 11/05/2014   COLONOSCOPY     COLONOSCOPY N/A 01/09/2021   Procedure: COLONOSCOPY;  Surgeon: [name], MD;  Location: [facility];  Service: Gastroenterology;  Laterality: N/A;   ESOPHAGOGASTRODUODENOSCOPY N/A 05/02/2015   Procedure: ESOPHAGOGASTRODUODENOSCOPY (EGD);  Surgeon: [name], MD;  Location: [facility];  Service: Endoscopy;  Laterality: N/A;   ESOPHAGOGASTRODUODENOSCOPY  05/02/2015   no source of pt chest pain endoscopically evident. small hiatal hernia.   ESOPHAGOGASTRODUODENOSCOPY (EGD) WITH PROPOFOL  N/A 01/02/2021   Procedure: ESOPHAGOGASTRODUODENOSCOPY (EGD) WITH PROPOFOL ;  Surgeon: [name], MD;  Location: [facility];  Service: Endoscopy;  Laterality: N/A;   HARDWARE REMOVAL Right 11/02/2019   Procedure: Second Metatarsal Removal of Deep Implant and Rotational Osteotomy;  Surgeon: [name], MD;  Location:  [facility];  Service: Orthopedics;  Laterality: Right;   IR ANGIOGRAM SELECTIVE EACH ADDITIONAL VESSEL  01/04/2021   IR ANGIOGRAM SELECTIVE EACH ADDITIONAL VESSEL  01/04/2021   IR ANGIOGRAM VISCERAL SELECTIVE  01/04/2021   IR US  GUIDE VASC ACCESS RIGHT  01/04/2021   LUMBAR LAMINECTOMY/DECOMPRESSION MICRODISCECTOMY N/A 07/03/2021   Procedure: Lumbar three through five decompression with lumbar three through five insitu fusion;  Surgeon: [name], MD;  Location: [facility];  Service: Orthopedics;  Laterality: N/A;   LUMBAR  PERCUTANEOUS PEDICLE SCREW 2 LEVEL N/A 12/31/2023   Procedure: LUMBAR PERCUTANEOUS PEDICLE SCREW LUMBAR THREE-LUMBAR FIVE;  Surgeon: [name], MD;  Location: [facility];  Service: Neurosurgery;  Laterality: N/A;   MEMBRANE PEEL Left 03/14/2014   Procedure: MEMBRANE PEEL; ENDOLASER;  Surgeon: [name], MD;  Location: [facility];  Service: Ophthalmology;  Laterality: Left;   NM MYOVIEW  LTD  10/2015   LOW RISK. Small, fixed basal lateral defect - likely diaphragmatic attenuation. EF 69%   PARS PLANA VITRECTOMY Left 03/14/2014   Procedure: PARS PLANA VITRECTOMY WITH 25 GAUGE;  Surgeon: [name], MD;  Location: [facility] OR;  Service: Ophthalmology;  Laterality: Left;   shave  02/03/2022   angiofibroma   TONSILLECTOMY     TRANSTHORACIC ECHOCARDIOGRAM  01/07/2021   Normal EF 60 to 65%.  No R WMA.  GRII DD-moderately dilated LA..  Mildly dilated RV but normal function.  Normal RAP/CVP.  Trivial AI with mild to moderate sclerosis-no stenosis   UMBILICAL HERNIA REPAIR  2023   UPPER GASTROINTESTINAL ENDOSCOPY     WEIL OSTEOTOMY Right 09/01/2017   Procedure: RIGHT GREAT TOE CHEVRON AND WEIL OSTEOTOMY 2ND METATARSAL;  Surgeon: [name], MD;  Location: [facility];  Service: Orthopedics;  Laterality: Right;   XI ROBOTIC ASSISTED VENTRAL HERNIA N/A 03/17/2022   Procedure: XI ROBOTIC ASSISTED VENTRAL HERNIA;  Surgeon: [name], MD;  Location: [facility];  Service: General;  Laterality: N/A;   Patient Active Problem List   Diagnosis Date Noted   Chronic hepatitis C (HCC) 03/09/2024   Malnutrition of moderate degree 01/06/2024   Spondylolisthesis at L4-L5 level 12/31/2023   Supine hypertension 12/03/2023   Parkinson's disease with neurogenic orthostatic hypotension (HCC) 12/03/2023   Preop cardiovascular exam 12/03/2023   Glaucoma 11/30/2023   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   Acquired hallux rigidus of right foot 11/23/2022   Ventral hernia without obstruction or gangrene    Primary open angle  glaucoma of left eye, mild stage 06/23/2021   Facial numbness 12/24/2020   Neovascular glaucoma, right eye, stage unspecified 12/16/2020   Lumbar radiculopathy 11/25/2020   Gastroesophageal reflux disease 09/10/2020   Dry eyes, bilateral 08/15/2020   Presbycusis of right ear 01/17/2020   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   Secondary corneal edema of right eye 12/12/2019   Ptosis of right eyelid 12/12/2019   OAB (overactive bladder) 11/29/2019   Bunion of great toe of right foot 07/14/2017   Claw toe, acquired, right 07/14/2017   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   Medication management 10/09/2016   Plantar fasciitis of right foot 02/19/2015   Depression 08/14/2014   Family history of heart disease in male family member before age 68 04/26/2014   Mild cognitive impairment 03/13/2014   Parkinsonian tremor (HCC) 02/12/2014   Hyperlipidemia with target LDL less than 100    Abdominal aortic atherosclerosis (HCC)    Moderate essential hypertension 02/25/2011  Arthropathy 02/25/2011   HAV (hallux abducto valgus) 02/25/2011    ONSET DATE: 01/19/2024  REFERRING DIAG: M43.16 (ICD-10-CM) - Spondylolisthesis, lumbar region  THERAPY DIAG:  Other symptoms and signs involving the nervous system  Muscle weakness (generalized)  Unsteadiness on feet  Rationale for Evaluation and Treatment: Rehabilitation  SUBJECTIVE:                                                                                                                                                                                             SUBJECTIVE STATEMENT: Wants to keep working on his balance. No falls. Feeling a little lightheaded and dizzy - did not wear compression socks today. Didn't have the time to get them on as they are difficult to get on   Pt accompanied by: self  PERTINENT HISTORY: PMH: s/p L3-5 prone trans-psoas approach for  lateral interbody fusion; R L4-5 facetectomy and posterior segmental instrumentation L3-5 (12/2023). PMH significant for PD (complicated by tremor, gait impairment, REM sleep behavior and cognitive impairment ), HTN, DM, gout, glaucoma, PVD   Per note from [facility]: He underwent back surgery on 6/6 (L2-5 lateral interbody fusion with posterior instrumentation) with postop confusion and delirium requiring restraints,  improved with use of Seroquel  and discontinuing opioids and muscle relaxants, suspected confusion due to GA, PD and possibly medication side effects.  He was discharged on Seroquel  25/50.  He was discharged to [facility] rehab on 6/18, has not yet started therapies. He continues to have occasional confusion although some what improving per wife, at times with paranoia and visual hallucinations, also has been wandering and currently wearing locator band on ankle   PAIN:  Are you having pain? No and just feeling a little discomfort in his back area   PRECAUTIONS: Back, Fall, and Other: hx of orthostatics, pt's spouse has to remind him of his precautions    FALLS: Has patient fallen in last 6 months? Yes. Number of falls 1, had a fall getting up and fell back against the window, this happened about a week or 2 before the surgery   LIVING ENVIRONMENT: Lives with: lives with their spouse Lives in: House/apartment Stairs: Yes: Internal: 15 steps; on right going up and External: 2 on the front porch and 3 on the side steps; can reach both, doesn't have to go up indoor steps, can live on the bottom floor  Has following equipment at home: Vannie - 2 wheeled, shower chair, and Grab bars  PLOF: Independent and Leisure: music, traveling Pt's spouse has to remind him of brace precautions for ADLs   PATIENT GOALS: Wants to be able to be  more independent, stronger, and more mobile   OBJECTIVE:  Note: Objective measures were completed at Evaluation unless otherwise  noted.  COGNITION: Overall cognitive status: Impaired   SENSATION: Light touch: WFL Reports numbness/tingling intermittently down front of BLE   COORDINATION: Heel to shin: slightly harder with LLE   POSTURE: rounded shoulders and forward head   LOWER EXTREMITY MMT:    MMT Right Eval Left Eval  Hip flexion 5 4+  Hip extension    Hip abduction 4 4  Hip adduction 4+ 4+  Hip internal rotation    Hip external rotation    Knee flexion 4+ 4+  Knee extension 4 -4  Ankle dorsiflexion 5 5  Ankle plantarflexion    Ankle inversion    Ankle eversion    (Blank rows = not tested)  All tested in sitting   BED MOBILITY:  Pt reports will feel stuck trying to get his legs in and out of the bed. Reviewed the log roll technique for bed mobility (PT demonstrating on the bed)  TRANSFERS: Sit to stand: SBA  Assistive device utilized: None     Stand to sit: SBA  Assistive device utilized: None      Mainly performs with UE support, but can perform without, with intermittent retropulsion  GAIT: Findings: Gait Characteristics: step through pattern, decreased arm swing- Right, decreased arm swing- Left, decreased stride length, decreased ankle dorsiflexion- Right, decreased ankle dorsiflexion- Left, Right foot flat, Left foot flat, shuffling, decreased trunk rotation, and narrow BOS, Distance walked: Clinic distances , Assistive device utilized:None, Level of assistance: SBA and CGA, and Comments: intermittent CGA during turns due to pt with narrow BOS   FUNCTIONAL TESTS:  5 times sit to stand: 21.4 seconds with no UE support, last rep pt with an episode of retropulsion  10 meter walk test: 11.5 seconds with no AD = 2.85 ft/sec                                                                                                                                      TREATMENT:    Self-Care:  Vitals:   04/13/24 1152  BP: (!) 92/55  Pulse: 64  Sitting Assessed BP in sitting (see above)  with low BP reading, pt not wearing compression socks today.   Attempted to check in standing with pt reporting feeling lightheaded (BP monitor unable to get accurate reading) and pt requesting to sit down and have some water   Pt reporting feeling shaky in sitting, so laid pt down. Assessed pt's BP in supine at: 119/71 Called pt's spouse regarding pt's BP values and that it is too low for pt to safely participate in therapy. Pt's spouse came to session and reports that he had not drank much water  this morning and pt's neurologist changed his Sinemet  yesterday.   Assessed BP in sitting again at 12:06 PM: 72/45, HR: 63 bpm    Assessed  BP in sitting again at 12:09 PM: 81/47, HR: 62 bpm Provided pt with multiple cups of water  as he had not had much water  today with pt reporting feeling better  BP in sitting at 12:14 PM: 86/50, HR: 63 bpm   Discussed calling EMS due to low BP readings, but pt and pt's spouse declined as pt starting to feel better and pt's spouse (who is also a retired Engineer, civil (consulting)) reports that it has been this low at home prior and pt feels better when he drinks more water . Discussed even if pt is running late, making sure that he wears his compression socks   Discussed monitoring at home, drinking fluids, and wearing his compression socks. And discussed when to go to the ER. Pt and pt's spouse verbalized understanding. Wheeled pt out to spouse's car and pt performed stand step transfer with SBA and reporting no symptoms.    PATIENT EDUCATION: Education details: see above  Person educated: Patient Education method: Explanation Education comprehension: verbalized understanding and needs further education   HOME EXERCISE PROGRAM: Standing PWR Moves  Access Code: [email or web address] URL: [email or web address] Date: 03/07/2024 Prepared by: Sheffield Senate  Exercises - Sit to Stand  - 1-2 x daily - 4-5 x weekly - 2 sets - 10 reps  GOALS: Goals reviewed with patient?  Yes  SHORT TERM GOALS: Target date: 02/29/2024  Pt will be independent with initial HEP for improved strength, balance, transfers and gait. Baseline: Goal status: INITIAL  2.  miniBEST to be assessed with LTG written.  Baseline: 20/28 Goal status: MET  3.  Pt will improve 5x sit<>stand to less than or equal to 18 sec to demonstrate improved functional strength and transfer efficiency.   Baseline: 21.4 seconds with no UE support, last rep pt with an episode of retropulsion   15.4 seconds with no UE support  Goal status: MET    LONG TERM GOALS: Target date: 03/21/2024 Updated goal date to reflect POC/last appt date: 03/30/24  Pt will verbalize understanding of local PD community resources, including fitness post DC.  Baseline:  Goal status: IN PROGRESS  2.  Pt will improve miniBEST to at least a 23/28 in order to demo decr fall risk.  Baseline: 20/28; 19/28 (9/4) Goal status: NOT MET   3.  Pt will perform cog TUG in 18 seconds or less in order to demo improved dual tasking.  Baseline: 22 seconds; 21.4s (9/4) Goal status: IN PROGRESS   4.  Pt will improve gait speed with no AD vs. LRAD to at least 3.1 ft/sec in order to demo improved community mobility.   Baseline: 11.5 seconds with no AD = 2.85 ft/sec  10.9 seconds with no AD = 3.0 ft/sec (8/25) 10.22s w/no AD = 3.21 ft/s no AD (9/4)  Goal status: MET   5.  Pt will improve 5x sit<>stand to less than or equal to 16 sec to demonstrate improved functional strength and transfer efficiency.  Baseline: 21.4 seconds with no UE support, last rep pt with an episode of retropulsion   14.6 seconds with no UE support (8/25) Goal status: MET   NEW LONG TERM GOALS:  Target date: 04/27/2024  Pt will verbalize understanding of local PD community resources, including fitness post DC.  Baseline:  Goal status: IN PROGRESS  2.   Pt will perform cog TUG in 18 seconds or less in order to demo improved dual tasking.  Baseline: 22 seconds;  21.4s (9/4) Goal status: IN PROGRESS    ASSESSMENT:  CLINICAL  IMPRESSION: See above. Session limited due to orthostatic BP with symptoms, pt not wearing compression socks, and pt not drinking water  today. Will perform 10th visit PN at next session.     OBJECTIVE IMPAIRMENTS: Abnormal gait, decreased activity tolerance, decreased balance, decreased cognition, decreased coordination, decreased endurance, decreased mobility, difficulty walking, decreased strength, decreased safety awareness, hypomobility, impaired sensation, and postural dysfunction.   ACTIVITY LIMITATIONS: carrying, lifting, bending, stairs, transfers, and locomotion level  PARTICIPATION LIMITATIONS: driving, shopping, community activity, and yard work  PERSONAL FACTORS: Age, Behavior pattern, Past/current experiences, Time since onset of injury/illness/exacerbation, and 3+ comorbidities: s/p L3-5 prone trans-psoas approach for lateral interbody fusion; R L4-5 facetectomy and posterior segmental instrumentation L3-5 (12/2023). PMH significant for PD (complicated by tremor, gait impairment, REM sleep behavior and cognitive impairment ), HTN, DM, gout, glaucoma, PVD, orthostatic hypotension  are also affecting patient's functional outcome.   REHAB POTENTIAL: Good  CLINICAL DECISION MAKING: Evolving/moderate complexity  EVALUATION COMPLEXITY: Moderate  PLAN:  PT FREQUENCY: 2x/week  PT DURATION: 8 weeks - anticipate just 6 weeks   PLANNED INTERVENTIONS: [id number]- PT Re-evaluation, 97750- Physical Performance Testing, 97110-Therapeutic exercises, 97530- Therapeutic activity, [id number]- Neuromuscular re-education, 97535- Self Care, [id number]- Manual therapy, [phone]- Gait training, Patient/Family education, Balance training, Stair training, and DME instructions  PLAN FOR NEXT SESSION: check BP! stepping strategies, SLS,  Cog dual tasking with balance tasks, toe walking  Plan to D/C at end of current appts, needs 10th visit  PN   Sheffield LOISE Senate, PT, DPT 04/13/2024, 12:45 PM

## 2024-04-14 ENCOUNTER — Other Ambulatory Visit (HOSPITAL_COMMUNITY): Payer: Self-pay

## 2024-04-14 ENCOUNTER — Other Ambulatory Visit: Payer: Self-pay

## 2024-04-15 ENCOUNTER — Other Ambulatory Visit (HOSPITAL_COMMUNITY): Payer: Self-pay

## 2024-04-17 ENCOUNTER — Other Ambulatory Visit: Payer: Self-pay

## 2024-04-18 ENCOUNTER — Encounter: Payer: Self-pay | Admitting: Physical Therapy

## 2024-04-18 ENCOUNTER — Ambulatory Visit: Payer: Self-pay | Admitting: Physical Therapy

## 2024-04-18 ENCOUNTER — Other Ambulatory Visit: Payer: Self-pay

## 2024-04-18 VITALS — BP 96/62 | HR 56

## 2024-04-18 DIAGNOSIS — R278 Other lack of coordination: Secondary | ICD-10-CM | POA: Diagnosis not present

## 2024-04-18 DIAGNOSIS — R251 Tremor, unspecified: Secondary | ICD-10-CM | POA: Diagnosis not present

## 2024-04-18 DIAGNOSIS — R2681 Unsteadiness on feet: Secondary | ICD-10-CM | POA: Diagnosis not present

## 2024-04-18 DIAGNOSIS — R471 Dysarthria and anarthria: Secondary | ICD-10-CM | POA: Diagnosis not present

## 2024-04-18 DIAGNOSIS — M5459 Other low back pain: Secondary | ICD-10-CM | POA: Diagnosis not present

## 2024-04-18 DIAGNOSIS — R29818 Other symptoms and signs involving the nervous system: Secondary | ICD-10-CM | POA: Diagnosis not present

## 2024-04-18 DIAGNOSIS — G20A2 Parkinson's disease without dyskinesia, with fluctuations: Secondary | ICD-10-CM | POA: Diagnosis not present

## 2024-04-18 DIAGNOSIS — M6281 Muscle weakness (generalized): Secondary | ICD-10-CM

## 2024-04-18 DIAGNOSIS — R293 Abnormal posture: Secondary | ICD-10-CM | POA: Diagnosis not present

## 2024-04-18 DIAGNOSIS — R1312 Dysphagia, oropharyngeal phase: Secondary | ICD-10-CM | POA: Diagnosis not present

## 2024-04-18 NOTE — Therapy (Signed)
 OUTPATIENT PHYSICAL THERAPY NEURO TREATMENT/10th VISIT PN    Patient Name: [name] MRN: [id number] DOB:[birth year], 73 y.o., male Today's Date: 04/18/2024   PCP: [name], MD   REFERRING PROVIDER: Caleen Dirks, MD (sent to Harlene Bogaert, NP)  10th Visit Physical Therapy Progress Note  Dates of Reporting Period: 02/08/24 to 04/18/24   END OF SESSION:  PT End of Session - 04/18/24 1149     Visit Number 11    Number of Visits 13    Date for Recertification  04/27/24   Recert   Authorization Type [employer] Medicare    PT Start Time 1147    PT Stop Time 1228    PT Time Calculation (min) 41 min    Equipment Utilized During Treatment Gait belt    Activity Tolerance Patient tolerated treatment well    Behavior During Therapy WFL for tasks assessed/performed          Past Medical History:  Diagnosis Date   Allergy    seasonal   Anxiety    Arthritis    BPH (benign prostatic hyperplasia)    Complication of anesthesia    Pt. stated he had a reaction that ended in him requiring urinary cath placement; hx delirium worsening parkinson symptoms   Depression    Diverticulosis    GERD (gastroesophageal reflux disease)    esophageal spasms   Glaucoma    Gout    Head injury, closed, with concussion    Hepatitis C    chronic - Has been treated with Harvoni   HLD (hyperlipidemia)    statin intolerant (Crestor  & Simvastatin ) - Taking Livalo  1mg  / week   Hypertension    Neuromuscular disorder (HCC)    Parkinson's Disease   Parkinson's disease (HCC)    Plantar fasciitis    right   PVD (peripheral vascular disease)    With no claudication; only mild abdominal aortic atherosclerosis noted on ultrasound.   Sleep apnea    Wears CPAP nightly   Spinal stenosis of lumbar region    Thoracic ascending aortic aneurysm    4.2 cm ascending TAA 09/2016 CT, 1 yr f/u rec   Ulcer    Past Surgical History:  Procedure Laterality Date   ANTERIOR LAT LUMBAR FUSION N/A 12/31/2023    Procedure: Prone trans-psoas interbody fusion - Lumbar three-Lumbar four - Lumbar four-Lumbar five, right facetectomy Lumbar four-five;  Surgeon: [name], MD;  Location: [facility];  Service: Neurosurgery;  Laterality: N/A;   BUNIONECTOMY WITH WEIL OSTEOTOMY Right 11/02/2019   Procedure: Right Foot Lapidus, Modified McBride Bunionectomy,  Hallux Akin Osteotomy;  Surgeon: [name], MD;  Location: [facility];  Service: Orthopedics;  Laterality: Right;   CARDIAC CATHETERIZATION  2005   30% Cx. Dr. Lavon   cataract surgery Left 11/05/2014   COLONOSCOPY     COLONOSCOPY N/A 01/09/2021   Procedure: COLONOSCOPY;  Surgeon: [name], MD;  Location: [facility];  Service: Gastroenterology;  Laterality: N/A;   ESOPHAGOGASTRODUODENOSCOPY N/A 05/02/2015   Procedure: ESOPHAGOGASTRODUODENOSCOPY (EGD);  Surgeon: [name], MD;  Location: [facility];  Service: Endoscopy;  Laterality: N/A;   ESOPHAGOGASTRODUODENOSCOPY  05/02/2015   no source of pt chest pain endoscopically evident. small hiatal hernia.   ESOPHAGOGASTRODUODENOSCOPY (EGD) WITH PROPOFOL  N/A 01/02/2021   Procedure: ESOPHAGOGASTRODUODENOSCOPY (EGD) WITH PROPOFOL ;  Surgeon: [name], MD;  Location: [facility];  Service: Endoscopy;  Laterality: N/A;   HARDWARE REMOVAL Right 11/02/2019   Procedure: Second Metatarsal Removal of Deep Implant and Rotational  Osteotomy;  Surgeon: Kit Rush, MD;  Location: [facility];  Service: Orthopedics;  Laterality: Right;   IR ANGIOGRAM SELECTIVE EACH ADDITIONAL VESSEL  01/04/2021   IR ANGIOGRAM SELECTIVE EACH ADDITIONAL VESSEL  01/04/2021   IR ANGIOGRAM VISCERAL SELECTIVE  01/04/2021   IR US  GUIDE VASC ACCESS RIGHT  01/04/2021   LUMBAR LAMINECTOMY/DECOMPRESSION MICRODISCECTOMY N/A 07/03/2021   Procedure: Lumbar three through five decompression with lumbar three through five insitu fusion;  Surgeon: [name], MD;  Location: [facility];  Service: Orthopedics;   Laterality: N/A;   LUMBAR PERCUTANEOUS PEDICLE SCREW 2 LEVEL N/A 12/31/2023   Procedure: LUMBAR PERCUTANEOUS PEDICLE SCREW LUMBAR THREE-LUMBAR FIVE;  Surgeon: [name], MD;  Location: [facility];  Service: Neurosurgery;  Laterality: N/A;   MEMBRANE PEEL Left 03/14/2014   Procedure: MEMBRANE PEEL; ENDOLASER;  Surgeon: [name], MD;  Location: [facility];  Service: Ophthalmology;  Laterality: Left;   NM MYOVIEW  LTD  10/2015   LOW RISK. Small, fixed basal lateral defect - likely diaphragmatic attenuation. EF 69%   PARS PLANA VITRECTOMY Left 03/14/2014   Procedure: PARS PLANA VITRECTOMY WITH 25 GAUGE;  Surgeon: [name], MD;  Location: [facility];  Service: Ophthalmology;  Laterality: Left;   shave  02/03/2022   angiofibroma   TONSILLECTOMY     TRANSTHORACIC ECHOCARDIOGRAM  01/07/2021   Normal EF 60 to 65%.  No R WMA.  GRII DD-moderately dilated LA..  Mildly dilated RV but normal function.  Normal RAP/CVP.  Trivial AI with mild to moderate sclerosis-no stenosis   UMBILICAL HERNIA REPAIR  2023   UPPER GASTROINTESTINAL ENDOSCOPY     WEIL OSTEOTOMY Right 09/01/2017   Procedure: RIGHT GREAT TOE CHEVRON AND WEIL OSTEOTOMY 2ND METATARSAL;  Surgeon: [name], MD;  Location: [facility];  Service: Orthopedics;  Laterality: Right;   XI ROBOTIC ASSISTED VENTRAL HERNIA N/A 03/17/2022   Procedure: XI ROBOTIC ASSISTED VENTRAL HERNIA;  Surgeon: [name], MD;  Location: [facility];  Service: General;  Laterality: N/A;   Patient Active Problem List   Diagnosis Date Noted   Chronic hepatitis C (HCC) 03/09/2024   Malnutrition of moderate degree 01/06/2024   Spondylolisthesis at L4-L5 level 12/31/2023   Supine hypertension 12/03/2023   Parkinson's disease with neurogenic orthostatic hypotension (HCC) 12/03/2023   Preop cardiovascular exam 12/03/2023   Glaucoma 11/30/2023   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   Acquired hallux rigidus of right foot 11/23/2022   Ventral hernia without obstruction or  gangrene    Primary open angle glaucoma of left eye, mild stage 06/23/2021   Facial numbness 12/24/2020   Neovascular glaucoma, right eye, stage unspecified 12/16/2020   Lumbar radiculopathy 11/25/2020   Gastroesophageal reflux disease 09/10/2020   Dry eyes, bilateral 08/15/2020   Presbycusis of right ear 01/17/2020   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   Secondary corneal edema of right eye 12/12/2019   Ptosis of right eyelid 12/12/2019   OAB (overactive bladder) 11/29/2019   Bunion of great toe of right foot 07/14/2017   Claw toe, acquired, right 07/14/2017   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   Medication management 10/09/2016   Plantar fasciitis of right foot 02/19/2015   Depression 08/14/2014   Family history of heart disease in male family member before age 45 04/26/2014   Mild cognitive impairment 03/13/2014   Parkinsonian tremor (HCC) 02/12/2014   Hyperlipidemia with target LDL less than 100    Abdominal aortic atherosclerosis  Moderate essential hypertension 02/25/2011   Arthropathy 02/25/2011   HAV (hallux abducto valgus) 02/25/2011    ONSET DATE: 01/19/2024  REFERRING DIAG: M43.16 (ICD-10-CM) - Spondylolisthesis, lumbar region  THERAPY DIAG:  Other symptoms and signs involving the nervous system  Unsteadiness on feet  Muscle weakness (generalized)  Rationale for Evaluation and Treatment: Rehabilitation  SUBJECTIVE:                                                                                                                                                                                             SUBJECTIVE STATEMENT: No falls, wants to review bed mobility. Drinking water  today as well as wearing compression socks. Feeling a little dizzy and lightheaded. Sometimes will feel a little burning in his low back. Still feeling some numbness.   Pt accompanied by: self  PERTINENT  HISTORY: PMH: s/p L3-5 prone trans-psoas approach for lateral interbody fusion; R L4-5 facetectomy and posterior segmental instrumentation L3-5 (12/2023). PMH significant for PD (complicated by tremor, gait impairment, REM sleep behavior and cognitive impairment ), HTN, DM, gout, glaucoma, PVD   Per note from [facility]: He underwent back surgery on 6/6 (L2-5 lateral interbody fusion with posterior instrumentation) with postop confusion and delirium requiring restraints,  improved with use of Seroquel  and discontinuing opioids and muscle relaxants, suspected confusion due to GA, PD and possibly medication side effects.  He was discharged on Seroquel  25/50.  He was discharged to [facility] rehab on 6/18, has not yet started therapies. He continues to have occasional confusion although some what improving per wife, at times with paranoia and visual hallucinations, also has been wandering and currently wearing locator band on ankle   PAIN:  Are you having pain? No   PRECAUTIONS: Back, Fall, and Other: hx of orthostatics, pt's spouse has to remind him of his precautions    FALLS: Has patient fallen in last 6 months? Yes. Number of falls 1, had a fall getting up and fell back against the window, this happened about a week or 2 before the surgery   LIVING ENVIRONMENT: Lives with: lives with their spouse Lives in: House/apartment Stairs: Yes: Internal: 15 steps; on right going up and External: 2 on the front porch and 3 on the side steps; can reach both, doesn't have to go up indoor steps, can live on the bottom floor  Has following equipment at home: Vannie - 2 wheeled, shower chair, and Grab bars  PLOF: Independent and Leisure: music, traveling Pt's spouse has to remind him of brace precautions for ADLs   PATIENT GOALS: Wants to be able to be more independent, stronger, and more  mobile   OBJECTIVE:  Note: Objective measures were completed at Evaluation unless otherwise  noted.  COGNITION: Overall cognitive status: Impaired   SENSATION: Light touch: WFL Reports numbness/tingling intermittently down front of BLE   COORDINATION: Heel to shin: slightly harder with LLE   POSTURE: rounded shoulders and forward head   LOWER EXTREMITY MMT:    MMT Right Eval Left Eval  Hip flexion 5 4+  Hip extension    Hip abduction 4 4  Hip adduction 4+ 4+  Hip internal rotation    Hip external rotation    Knee flexion 4+ 4+  Knee extension 4 -4  Ankle dorsiflexion 5 5  Ankle plantarflexion    Ankle inversion    Ankle eversion    (Blank rows = not tested)  All tested in sitting   BED MOBILITY:  Pt reports will feel stuck trying to get his legs in and out of the bed. Reviewed the log roll technique for bed mobility (PT demonstrating on the bed)  TRANSFERS: Sit to stand: SBA  Assistive device utilized: None     Stand to sit: SBA  Assistive device utilized: None      Mainly performs with UE support, but can perform without, with intermittent retropulsion  GAIT: Findings: Gait Characteristics: step through pattern, decreased arm swing- Right, decreased arm swing- Left, decreased stride length, decreased ankle dorsiflexion- Right, decreased ankle dorsiflexion- Left, Right foot flat, Left foot flat, shuffling, decreased trunk rotation, and narrow BOS, Distance walked: Clinic distances , Assistive device utilized:None, Level of assistance: SBA and CGA, and Comments: intermittent CGA during turns due to pt with narrow BOS   FUNCTIONAL TESTS:  5 times sit to stand: 21.4 seconds with no UE support, last rep pt with an episode of retropulsion  10 meter walk test: 11.5 seconds with no AD = 2.85 ft/sec                                                                                                                                  TREATMENT:     Ther Act  Vitals:   04/18/24 1154 04/18/24 1158  BP: 106/72 96/62  Pulse: (!) 56    Seated and Standing  Pt  denying any lightheadedness or dizziness in standing today and wearing compression socks. Took incr time to get BP in standing as automatic machines were not working and PT had to take it manually    Pt wanting to review bed mobility that was gone over at previous session, reviewed log roll technique esp when getting out of bed instead of just coming up into log sit   Discussed at end of session with pt's spouse present plan to D/C at next appt with getting scheduling for PD screens in 6 months. Both in agreement with plan   NMR: On blue mat: 2 x 10 reps heel raises, pt losing balance anteriorly and needing UE support against chair  2  x 10 reps toe raises, trying to perform without UE support  Alternating forward stepping with 4# ball toss to floor and with trunk rotation over leg stepping forwards, performed 10 reps each side, CGA/min A for balance, pt with decr amplitude of movement when tossing ball to floor, so PT frequently having to get ball for pt and needing reminder cues to not go diving for ball  Sit to stands with holding 10# medicine ball and throwing it forward for incr amplitude of movement 5 reps, performed an additional 10 reps with pt naming foods in alphabetical order from A-Z, pt with difficulty with cognitive challenge, needing CGA initially in standing for balance and one episode of mod A due to pt almost losing balance anteriorly   PATIENT EDUCATION: Education details: plan to D/C at next session  Person educated: Patient Education method: Explanation Education comprehension: verbalized understanding and needs further education   HOME EXERCISE PROGRAM: Standing PWR Moves  Access Code: [email or web address] URL: [email or web address] Date: 03/07/2024 Prepared by: Sheffield Senate  Exercises - Sit to Stand  - 1-2 x daily - 4-5 x weekly - 2 sets - 10 reps  GOALS: Goals reviewed with patient? Yes  SHORT TERM GOALS: Target date: 02/29/2024  Pt will be independent  with initial HEP for improved strength, balance, transfers and gait. Baseline: Goal status: INITIAL  2.  miniBEST to be assessed with LTG written.  Baseline: 20/28 Goal status: MET  3.  Pt will improve 5x sit<>stand to less than or equal to 18 sec to demonstrate improved functional strength and transfer efficiency.   Baseline: 21.4 seconds with no UE support, last rep pt with an episode of retropulsion   15.4 seconds with no UE support  Goal status: MET    LONG TERM GOALS: Target date: 03/21/2024 Updated goal date to reflect POC/last appt date: 03/30/24  Pt will verbalize understanding of local PD community resources, including fitness post DC.  Baseline:  Goal status: IN PROGRESS  2.  Pt will improve miniBEST to at least a 23/28 in order to demo decr fall risk.  Baseline: 20/28; 19/28 (9/4) Goal status: NOT MET   3.  Pt will perform cog TUG in 18 seconds or less in order to demo improved dual tasking.  Baseline: 22 seconds; 21.4s (9/4) Goal status: IN PROGRESS   4.  Pt will improve gait speed with no AD vs. LRAD to at least 3.1 ft/sec in order to demo improved community mobility.   Baseline: 11.5 seconds with no AD = 2.85 ft/sec  10.9 seconds with no AD = 3.0 ft/sec (8/25) 10.22s w/no AD = 3.21 ft/s no AD (9/4)  Goal status: MET   5.  Pt will improve 5x sit<>stand to less than or equal to 16 sec to demonstrate improved functional strength and transfer efficiency.  Baseline: 21.4 seconds with no UE support, last rep pt with an episode of retropulsion   14.6 seconds with no UE support (8/25) Goal status: MET   NEW LONG TERM GOALS:  Target date: 04/27/2024  Pt will verbalize understanding of local PD community resources, including fitness post DC.  Baseline:  Goal status: IN PROGRESS  2.   Pt will perform cog TUG in 18 seconds or less in order to demo improved dual tasking.  Baseline: 22 seconds; 21.4s (9/4) Goal status: IN PROGRESS    ASSESSMENT:  CLINICAL  IMPRESSION: 10th visit PN: Pt's BP WFL today with pt wearing his compression socks and pt reporting no lightheadedness or dizziness.  Today's skilled session focused on working on balance strategies on compliant surfaces for stepping and limits of stability. Pt needing intermittent assist from therapist for balance while on compliant surfaces due to almost losing balance anteriorly. Since LTGs last assessed, pt has reached a plateau with PT at this time and plan to D/C at next session. Pt and pt's spouse in agreement with plan. Will continue per POC   OBJECTIVE IMPAIRMENTS: Abnormal gait, decreased activity tolerance, decreased balance, decreased cognition, decreased coordination, decreased endurance, decreased mobility, difficulty walking, decreased strength, decreased safety awareness, hypomobility, impaired sensation, and postural dysfunction.   ACTIVITY LIMITATIONS: carrying, lifting, bending, stairs, transfers, and locomotion level  PARTICIPATION LIMITATIONS: driving, shopping, community activity, and yard work  PERSONAL FACTORS: Age, Behavior pattern, Past/current experiences, Time since onset of injury/illness/exacerbation, and 3+ comorbidities: s/p L3-5 prone trans-psoas approach for lateral interbody fusion; R L4-5 facetectomy and posterior segmental instrumentation L3-5 (12/2023). PMH significant for PD (complicated by tremor, gait impairment, REM sleep behavior and cognitive impairment ), HTN, DM, gout, glaucoma, PVD, orthostatic hypotension  are also affecting patient's functional outcome.   REHAB POTENTIAL: Good  CLINICAL DECISION MAKING: Evolving/moderate complexity  EVALUATION COMPLEXITY: Moderate  PLAN:  PT FREQUENCY: 2x/week  PT DURATION: 8 weeks - anticipate just 6 weeks   PLANNED INTERVENTIONS: 97164- PT Re-evaluation, 97750- Physical Performance Testing, 97110-Therapeutic exercises, 97530- Therapeutic activity, [id number]- Neuromuscular re-education, 97535- Self Care, [id number]-  Manual therapy, [phone]- Gait training, Patient/Family education, Balance training, Stair training, and DME instructions  PLAN FOR NEXT SESSION: check BP!  D/C and get scheduled for PD screens    Sheffield LOISE Senate, PT, DPT 04/18/2024, 12:43 PM

## 2024-04-19 ENCOUNTER — Encounter: Payer: Self-pay | Admitting: Cardiology

## 2024-04-19 ENCOUNTER — Encounter: Payer: Self-pay | Admitting: Speech Pathology

## 2024-04-19 ENCOUNTER — Ambulatory Visit: Admitting: Speech Pathology

## 2024-04-19 DIAGNOSIS — R251 Tremor, unspecified: Secondary | ICD-10-CM | POA: Diagnosis not present

## 2024-04-19 DIAGNOSIS — M6281 Muscle weakness (generalized): Secondary | ICD-10-CM | POA: Diagnosis not present

## 2024-04-19 DIAGNOSIS — R471 Dysarthria and anarthria: Secondary | ICD-10-CM

## 2024-04-19 DIAGNOSIS — R278 Other lack of coordination: Secondary | ICD-10-CM | POA: Diagnosis not present

## 2024-04-19 DIAGNOSIS — R29818 Other symptoms and signs involving the nervous system: Secondary | ICD-10-CM | POA: Diagnosis not present

## 2024-04-19 DIAGNOSIS — R41841 Cognitive communication deficit: Secondary | ICD-10-CM

## 2024-04-19 DIAGNOSIS — R1312 Dysphagia, oropharyngeal phase: Secondary | ICD-10-CM

## 2024-04-19 DIAGNOSIS — R293 Abnormal posture: Secondary | ICD-10-CM | POA: Diagnosis not present

## 2024-04-19 DIAGNOSIS — G20A2 Parkinson's disease without dyskinesia, with fluctuations: Secondary | ICD-10-CM | POA: Diagnosis not present

## 2024-04-19 DIAGNOSIS — M5459 Other low back pain: Secondary | ICD-10-CM | POA: Diagnosis not present

## 2024-04-19 DIAGNOSIS — R2681 Unsteadiness on feet: Secondary | ICD-10-CM | POA: Diagnosis not present

## 2024-04-19 NOTE — Patient Instructions (Signed)
 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 They live in [place] is 9 and does soccer  [name] is 12 and does soccer, basketball and swimming  My son  [name] is a Sport and exercise psychologist. He works at the [employer] airport.  [name] lives in [place] and works in Producer, television/film/video for high risk children at [employer] [name]  06/12/1951  [address]  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 They live in [place] is 9 and does soccer  [name] is 12 and does soccer, basketball and swimming  My son  [name] is a Sport and exercise psychologist. He works at the [employer] airport.  [name] lives in [place] and works in Producer, television/film/video for high risk children at [employer]  Her husband is [name]. He is a Best boy  [name] goes to day care - she is 1 and a half  My anniversary is September 16. We married in 1978    Her husband is [name]. He is a Best boy  [name] goes to day care - she is 1 and a half  My anniversary is September 16. We married in 1978

## 2024-04-19 NOTE — Therapy (Signed)
 OUTPATIENT SPEECH LANGUAGE PATHOLOGY PARKINSON'S TREATMENT   Patient Name: [name] MRN: [id number] DOB:[birth year], 73 y.o., male Today's Date: 04/19/2024  PCP: [name], MD REFERRING PROVIDER: [name], NP  END OF SESSION:  End of Session - 04/19/24 0948     Visit Number 3    Number of Visits 13    Date for Recertification  06/27/24    Authorization Type [employer] Medicare - requested auth    SLP Start Time 0932    SLP Stop Time  1013    SLP Time Calculation (min) 41 min    Activity Tolerance Patient tolerated treatment well          Past Medical History:  Diagnosis Date   Allergy    seasonal   Anxiety    Arthritis    BPH (benign prostatic hyperplasia)    Complication of anesthesia    Pt. stated he had a reaction that ended in him requiring urinary cath placement; hx delirium worsening parkinson symptoms   Depression    Diverticulosis    GERD (gastroesophageal reflux disease)    esophageal spasms   Glaucoma    Gout    Head injury, closed, with concussion    Hepatitis C    chronic - Has been treated with Harvoni   HLD (hyperlipidemia)    statin intolerant (Crestor  & Simvastatin ) - Taking Livalo  1mg  / week   Hypertension    Neuromuscular disorder (HCC)    Parkinson's Disease   Parkinson's disease (HCC)    Plantar fasciitis    right   PVD (peripheral vascular disease)    With no claudication; only mild abdominal aortic atherosclerosis noted on ultrasound.   Sleep apnea    Wears CPAP nightly   Spinal stenosis of lumbar region    Thoracic ascending aortic aneurysm    4.2 cm ascending TAA 09/2016 CT, 1 yr f/u rec   Ulcer    Past Surgical History:  Procedure Laterality Date   ANTERIOR LAT LUMBAR FUSION N/A 12/31/2023   Procedure: Prone trans-psoas interbody fusion - Lumbar three-Lumbar four - Lumbar four-Lumbar five, right facetectomy Lumbar four-five;  Surgeon: [name], MD;  Location: [facility];  Service: Neurosurgery;  Laterality: N/A;    BUNIONECTOMY WITH WEIL OSTEOTOMY Right 11/02/2019   Procedure: Right Foot Lapidus, Modified McBride Bunionectomy,  Hallux Akin Osteotomy;  Surgeon: [name], MD;  Location: [facility];  Service: Orthopedics;  Laterality: Right;   CARDIAC CATHETERIZATION  2005   30% Cx. Dr. Lavon   cataract surgery Left 11/05/2014   COLONOSCOPY     COLONOSCOPY N/A 01/09/2021   Procedure: COLONOSCOPY;  Surgeon: [name], MD;  Location: [facility];  Service: Gastroenterology;  Laterality: N/A;   ESOPHAGOGASTRODUODENOSCOPY N/A 05/02/2015   Procedure: ESOPHAGOGASTRODUODENOSCOPY (EGD);  Surgeon: [name], MD;  Location: [facility];  Service: Endoscopy;  Laterality: N/A;   ESOPHAGOGASTRODUODENOSCOPY  05/02/2015   no source of pt chest pain endoscopically evident. small hiatal hernia.   ESOPHAGOGASTRODUODENOSCOPY (EGD) WITH PROPOFOL  N/A 01/02/2021   Procedure: ESOPHAGOGASTRODUODENOSCOPY (EGD) WITH PROPOFOL ;  Surgeon: [name], MD;  Location: [facility];  Service: Endoscopy;  Laterality: N/A;   HARDWARE REMOVAL Right 11/02/2019   Procedure: Second Metatarsal Removal of Deep Implant and Rotational Osteotomy;  Surgeon: [name], MD;  Location: [facility];  Service: Orthopedics;  Laterality: Right;   IR ANGIOGRAM SELECTIVE EACH ADDITIONAL VESSEL  01/04/2021   IR ANGIOGRAM SELECTIVE EACH ADDITIONAL VESSEL  01/04/2021   IR ANGIOGRAM VISCERAL  SELECTIVE  01/04/2021   IR US  GUIDE VASC ACCESS RIGHT  01/04/2021   LUMBAR LAMINECTOMY/DECOMPRESSION MICRODISCECTOMY N/A 07/03/2021   Procedure: Lumbar three through five decompression with lumbar three through five insitu fusion;  Surgeon: [name], MD;  Location: [facility] OR;  Service: Orthopedics;  Laterality: N/A;   LUMBAR PERCUTANEOUS PEDICLE SCREW 2 LEVEL N/A 12/31/2023   Procedure: LUMBAR PERCUTANEOUS PEDICLE SCREW LUMBAR THREE-LUMBAR FIVE;  Surgeon: [name], MD;  Location: [facility];  Service: Neurosurgery;   Laterality: N/A;   MEMBRANE PEEL Left 03/14/2014   Procedure: MEMBRANE PEEL; ENDOLASER;  Surgeon: [name], MD;  Location: [facility];  Service: Ophthalmology;  Laterality: Left;   NM MYOVIEW  LTD  10/2015   LOW RISK. Small, fixed basal lateral defect - likely diaphragmatic attenuation. EF 69%   PARS PLANA VITRECTOMY Left 03/14/2014   Procedure: PARS PLANA VITRECTOMY WITH 25 GAUGE;  Surgeon: [name], MD;  Location: [facility] OR;  Service: Ophthalmology;  Laterality: Left;   shave  02/03/2022   angiofibroma   TONSILLECTOMY     TRANSTHORACIC ECHOCARDIOGRAM  01/07/2021   Normal EF 60 to 65%.  No R WMA.  GRII DD-moderately dilated LA..  Mildly dilated RV but normal function.  Normal RAP/CVP.  Trivial AI with mild to moderate sclerosis-no stenosis   UMBILICAL HERNIA REPAIR  2023   UPPER GASTROINTESTINAL ENDOSCOPY     WEIL OSTEOTOMY Right 09/01/2017   Procedure: RIGHT GREAT TOE CHEVRON AND WEIL OSTEOTOMY 2ND METATARSAL;  Surgeon: [name], MD;  Location: [facility];  Service: Orthopedics;  Laterality: Right;   XI ROBOTIC ASSISTED VENTRAL HERNIA N/A 03/17/2022   Procedure: XI ROBOTIC ASSISTED VENTRAL HERNIA;  Surgeon: [name], MD;  Location: [facility];  Service: General;  Laterality: N/A;   Patient Active Problem List   Diagnosis Date Noted   Chronic hepatitis C (HCC) 03/09/2024   Malnutrition of moderate degree 01/06/2024   Spondylolisthesis at L4-L5 level 12/31/2023   Supine hypertension 12/03/2023   Parkinson's disease with neurogenic orthostatic hypotension (HCC) 12/03/2023   Preop cardiovascular exam 12/03/2023   Glaucoma 11/30/2023   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   Acquired hallux rigidus of right foot 11/23/2022   Ventral hernia without obstruction or gangrene    Primary open angle glaucoma of left eye, mild stage 06/23/2021   Facial numbness 12/24/2020   Neovascular glaucoma, right eye, stage unspecified 12/16/2020   Lumbar radiculopathy 11/25/2020   Gastroesophageal  reflux disease 09/10/2020   Dry eyes, bilateral 08/15/2020   Presbycusis of right ear 01/17/2020   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   Secondary corneal edema of right eye 12/12/2019   Ptosis of right eyelid 12/12/2019   OAB (overactive bladder) 11/29/2019   Bunion of great toe of right foot 07/14/2017   Claw toe, acquired, right 07/14/2017   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   Medication management 10/09/2016   Plantar fasciitis of right foot 02/19/2015   Depression 08/14/2014   Family history of heart disease in male family member before age 63 04/26/2014   Mild cognitive impairment 03/13/2014   Parkinsonian tremor (HCC) 02/12/2014   Hyperlipidemia with target LDL less than 100    Abdominal aortic atherosclerosis    Moderate essential hypertension 02/25/2011   Arthropathy 02/25/2011   HAV (hallux abducto valgus) 02/25/2011    ONSET DATE: 02/08/24 (referral date)  REFERRING DIAG: G20.A2 (ICD-10-CM) - Parkinson's disease without dyskinesia, with fluctuating manifestations (HCC)  THERAPY DIAG:  Dysarthria and anarthria  Dysphagia, oropharyngeal phase  Cognitive communication deficit  Rationale for Evaluation and Treatment: Rehabilitation  SUBJECTIVE:   SUBJECTIVE STATEMENT: I can't remember what she said or what I was saying Pt accompanied by: significant other [name]  PERTINENT HISTORY: recent back surgery (L3-5) on 12/31/23, Parkinson's disease causing autonomic dysregulation and orthostatic hypotension, leading to blood pressure fluctuations and dizziness upon standing. Lumbar radiculopathy (Chronic), PVD, HLD, spinal stenosis, BPH   Post-surgical recovery complicated by inappropriate medication management and inadequate rehabilitation, with delirium post-anesthesia likely exacerbated by Parkinson's disease.     PAIN:  Are you having pain? No  FALLS: Has patient fallen in last 6 months?   See PT evaluation for details  LIVING ENVIRONMENT: Lives with: lives with their spouse Lives in: House/apartment  PLOF:  Level of assistance: Needed assistance with ADLs, Needed assistance with IADLS Employment: Retired  PATIENT GOALS: To remember what was said                                                                                                                             TREATMENT DATE:   04/19/24: [name] reports completing SPEAK OUT! Lessons 2 and 3. Today, he has had a med adjustment and is overall slower. Targeted volume and intelligibility in Lesson 2 (which we did last session) and he required frequent mod verbal cues and re-reading to process and follow the instructions for each exercise and usual modeling to achieve dB targets today. Reading dB averaged 75dB, however he demonstrated confusion re: reading each phrase twice. Conversation exercises averaged 70dB with verbal cues to correct volume decay with increased cognitive load. Re-demonstrated how to navigate to the flash card in E Library per spouse request. Generated list of personally relevant phrases: re: his family members and their jobs and children to add to HEP for practice and support recall of their information. See Patient Instructions  03/1724: [name] received his SPEAK OUT! Booklet. They have watched the What is Parkinson's video and will have their children watch it as well.  Targeted volume and intelligibility using Speak Out! Lesson 2. Pt required initial usual mod verbal cues, modeling, for volume and breath support, fading to occasional min A.  Pt averages the following volume levels:            Sustained AH: 84dB            Counting:80dB            Reading (phrases): 75dB            Cognitive Exercise: 72dB with frequent mod A Required  verbal cues, modeling for carryover of Intent /volume answering simple questions following structured practice. Generated list of personally relevant phrases to practice  with intent, including name, number DOB, address, restaurant orders and script explaining that he has PD and may have trouble speaking- See Patient instructions   04/04/24: Evaluation completed. Introduced [employer] - ordered [employer] OUT! Booklet and  E library access. Demonstrated navigating website to locate What is Parkinsons video and home practice sessions. Initiated SPEAK OUT! Lesson 1 to  target volume, intelligibility and speaking with intent as well as cognitive communication exercises. With usual min to mod A, [name] averaged 79dB on sustained Ah, 76dB on glides, 78dB counting and 75dB reading. In conversation/cognitive exercise, with verbal cues and modeling, he averaged 71dB. Instructed them to complete Lesson one again in [employer], as well at to complete home practice sessions on website.    PATIENT EDUCATION: Education details: HEP for dysarthria, swallow precautions, compensations for slow processing and following conversation Person educated: Patient and Spouse Education method: Programmer, multimedia, Facilities manager, Verbal cues, and Handouts Education comprehension: verbal cues required and needs further education  HOME EXERCISE PROGRAM: SPEAK OUT!   GOALS: Goals reviewed with patient? Yes  SHORT TERM GOALS: Target date: 05/29/24 (extended due to scheduling)  Pt will complete HEP for dysarthria with occasional min A over 2 sessions Baseline: Goal status: INITIAL  2.  Pt will average 74dB 18/20 sentences Baseline:  Goal status: INITIAL  3.  Pt will follow swallow precautions with occasional min A Baseline:  Goal status: INITIAL  4.  Pt and spouse will carryover 2 compensatory strategies to support slow processing  Baseline:  Goal status: INITIAL  5.  Pt will average 72dB over 8 minute conversation Baseline:  Goal status: INITIAL   LONG TERM GOALS: Target date: 06/27/24  Pt will complete HEP with rare min A over 2 sessions Baseline:  Goal status:  INITIAL  2.  Pt will complete 3 online home practice sessions Baseline:  Goal status: INITIAL  3.  Pt will access E Library and complete 3 lessons over 2 weeks with min A from spouse Baseline:  Goal status: INITIAL  4.  Pt will report no episodes of globus sensation with pills Baseline:  Goal status: INITIAL  5.  Pt will average 72dB over 15 minute conversation with rare min A Baseline:  Goal status: INITIAL  6.  Pt and spouse will carry over 4 compensatory strategies to support slow processing in conversation and directions Baseline:  Goal status: INITIAL  ASSESSMENT:  CLINICAL IMPRESSION: Patient is a 73 y.o. 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 They report that [name] freezes when speaking, has episodes of dysfluency and looses the topic.[name] reports slow follow through  and difficulty following more than one direction at a time. [name] endorses difficulty maintaining his train of thought and following conversations. He is using a calendar to recall appointments, he is using the microwave and coffee maker independently. [name] reports increased difficulty using their remote control system and that she tells him to put out the dog, and he forgets to let the dog back in. Overall volume low affecting intelligibility. [name] passed Yale swallow test, however he reports pills get stuck and he is having more difficulty swallowing solids. Will monitor and order MBSS if needed. I recommend skilled ST to maximize intelligibility, safety of swallow and cognition for safety, to reduce caregiver burden and improve participation in conversations for QOL. .   OBJECTIVE IMPAIRMENTS: Objective impairments include attention, memory, awareness, executive functioning, dysarthria, and dysphagia. These impairments are limiting patient from managing medications, managing appointments, managing finances, household  responsibilities, ADLs/IADLs, effectively communicating at home and in community, and safety when swallowing.Factors affecting potential to achieve goals and functional outcome are ability to learn/carryover information and medical prognosis.. Patient will  benefit from skilled SLP services to address above impairments and improve overall function.  REHAB POTENTIAL: Good  PLAN:  SLP FREQUENCY: 1-2x/week  SLP DURATION: 12 weeks  PLANNED INTERVENTIONS: Aspiration precaution training, Pharyngeal strengthening exercises, Diet toleration management , Language facilitation, Environmental controls, Trials of upgraded texture/liquids, Cueing hierachy, Cognitive reorganization, Internal/external aids, Functional tasks, Multimodal communication approach, SLP instruction and feedback, Compensatory strategies, Patient/family education, [phone] Treatment of speech (30 or 45 min) , [id number] Treatment of swallowing function, [phone]- Speech [address] Artic, Phon, Eval Compre, Express, and MBSS    Delisa Finck, Leita Caldron, CCC-SLP 04/19/2024, 12:04 PM

## 2024-04-20 ENCOUNTER — Ambulatory Visit: Admitting: Physical Therapy

## 2024-04-20 ENCOUNTER — Encounter: Payer: Self-pay | Admitting: Adult Health

## 2024-04-21 ENCOUNTER — Encounter: Payer: Self-pay | Admitting: Adult Health

## 2024-04-21 ENCOUNTER — Other Ambulatory Visit (HOSPITAL_COMMUNITY): Payer: Self-pay

## 2024-04-21 MED ORDER — MIDODRINE HCL 2.5 MG PO TABS
2.5000 mg | ORAL_TABLET | Freq: Every morning | ORAL | 4 refills | Status: AC
  Filled 2024-04-21: qty 30, 30d supply, fill #0
  Filled 2024-04-24: qty 7, 7d supply, fill #0
  Filled 2024-04-26: qty 7, 7d supply, fill #1
  Filled 2024-04-28 (×2): qty 17, 17d supply, fill #1
  Filled 2024-04-28 – 2024-05-01 (×3): qty 7, 7d supply, fill #1
  Filled 2024-05-09: qty 30, 30d supply, fill #2
  Filled 2024-06-05: qty 30, 30d supply, fill #3
  Filled 2024-07-10: qty 30, 30d supply, fill #4
  Filled 2024-08-08: qty 30, 30d supply, fill #5

## 2024-04-21 NOTE — Telephone Encounter (Signed)
 Per Harlene last OV note 04/12/24:

## 2024-04-23 ENCOUNTER — Encounter: Payer: Self-pay | Admitting: Adult Health

## 2024-04-24 ENCOUNTER — Other Ambulatory Visit: Payer: Self-pay

## 2024-04-26 ENCOUNTER — Encounter: Payer: Self-pay | Admitting: Speech Pathology

## 2024-04-26 ENCOUNTER — Ambulatory Visit: Attending: Adult Health | Admitting: Speech Pathology

## 2024-04-26 ENCOUNTER — Other Ambulatory Visit (HOSPITAL_COMMUNITY): Payer: Self-pay

## 2024-04-26 DIAGNOSIS — R41841 Cognitive communication deficit: Secondary | ICD-10-CM | POA: Insufficient documentation

## 2024-04-26 DIAGNOSIS — R1312 Dysphagia, oropharyngeal phase: Secondary | ICD-10-CM | POA: Insufficient documentation

## 2024-04-26 DIAGNOSIS — R471 Dysarthria and anarthria: Secondary | ICD-10-CM | POA: Diagnosis present

## 2024-04-26 DIAGNOSIS — R29818 Other symptoms and signs involving the nervous system: Secondary | ICD-10-CM | POA: Diagnosis present

## 2024-04-26 DIAGNOSIS — R2681 Unsteadiness on feet: Secondary | ICD-10-CM | POA: Insufficient documentation

## 2024-04-26 DIAGNOSIS — M6281 Muscle weakness (generalized): Secondary | ICD-10-CM | POA: Insufficient documentation

## 2024-04-26 NOTE — Patient Instructions (Signed)
   Each lesson should be completed twice  Take your time and use your best voice - say it like you mean it  This week - complete an online home practice session - On [employer] website - under Our Program - scroll to home practice sessions, the scroll down to find a previously recorded session - it takes a few moments to get started

## 2024-04-26 NOTE — Therapy (Signed)
 OUTPATIENT SPEECH LANGUAGE PATHOLOGY PARKINSON'S TREATMENT   Patient Name: [name] MRN: [id number] DOB:[birth year], 73 y.o., male Today's Date: 04/26/2024  PCP: [name], MD REFERRING PROVIDER: [name], NP  END OF SESSION:  End of Session - 04/26/24 0939     Visit Number 4    Number of Visits 13    Date for Recertification  06/27/24    Authorization Type [employer] Medicare - requested auth    SLP Start Time 0935    SLP Stop Time  1015    SLP Time Calculation (min) 40 min    Activity Tolerance Patient tolerated treatment well          Past Medical History:  Diagnosis Date   Allergy    seasonal   Anxiety    Arthritis    BPH (benign prostatic hyperplasia)    Complication of anesthesia    Pt. stated he had a reaction that ended in him requiring urinary cath placement; hx delirium worsening parkinson symptoms   Depression    Diverticulosis    GERD (gastroesophageal reflux disease)    esophageal spasms   Glaucoma    Gout    Head injury, closed, with concussion    Hepatitis C    chronic - Has been treated with Harvoni   HLD (hyperlipidemia)    statin intolerant (Crestor  & Simvastatin ) - Taking Livalo  1mg  / week   Hypertension    Neuromuscular disorder (HCC)    Parkinson's Disease   Parkinson's disease (HCC)    Plantar fasciitis    right   PVD (peripheral vascular disease)    With no claudication; only mild abdominal aortic atherosclerosis noted on ultrasound.   Sleep apnea    Wears CPAP nightly   Spinal stenosis of lumbar region    Thoracic ascending aortic aneurysm    4.2 cm ascending TAA 09/2016 CT, 1 yr f/u rec   Ulcer    Past Surgical History:  Procedure Laterality Date   ANTERIOR LAT LUMBAR FUSION N/A 12/31/2023   Procedure: Prone trans-psoas interbody fusion - Lumbar three-Lumbar four - Lumbar four-Lumbar five, right facetectomy Lumbar four-five;  Surgeon: [name], MD;  Location: [facility];  Service: Neurosurgery;  Laterality: N/A;    BUNIONECTOMY WITH WEIL OSTEOTOMY Right 11/02/2019   Procedure: Right Foot Lapidus, Modified McBride Bunionectomy,  Hallux Akin Osteotomy;  Surgeon: [name], MD;  Location: [facility];  Service: Orthopedics;  Laterality: Right;   CARDIAC CATHETERIZATION  2005   30% Cx. Dr. Lavon   cataract surgery Left 11/05/2014   COLONOSCOPY     COLONOSCOPY N/A 01/09/2021   Procedure: COLONOSCOPY;  Surgeon: [name], MD;  Location: [facility];  Service: Gastroenterology;  Laterality: N/A;   ESOPHAGOGASTRODUODENOSCOPY N/A 05/02/2015   Procedure: ESOPHAGOGASTRODUODENOSCOPY (EGD);  Surgeon: [name], MD;  Location: [facility];  Service: Endoscopy;  Laterality: N/A;   ESOPHAGOGASTRODUODENOSCOPY  05/02/2015   no source of pt chest pain endoscopically evident. small hiatal hernia.   ESOPHAGOGASTRODUODENOSCOPY (EGD) WITH PROPOFOL  N/A 01/02/2021   Procedure: ESOPHAGOGASTRODUODENOSCOPY (EGD) WITH PROPOFOL ;  Surgeon: [name], MD;  Location: [facility];  Service: Endoscopy;  Laterality: N/A;   HARDWARE REMOVAL Right 11/02/2019   Procedure: Second Metatarsal Removal of Deep Implant and Rotational Osteotomy;  Surgeon: [name], MD;  Location:  [facility]ERY [facility];  Service: Orthopedics;  Laterality: Right;   IR ANGIOGRAM SELECTIVE EACH ADDITIONAL VESSEL  01/04/2021   IR ANGIOGRAM SELECTIVE EACH ADDITIONAL VESSEL  01/04/2021   IR ANGIOGRAM VISCERAL SELECTIVE  01/04/2021   IR US  GUIDE VASC ACCESS RIGHT  01/04/2021   LUMBAR LAMINECTOMY/DECOMPRESSION MICRODISCECTOMY N/A 07/03/2021   Procedure: Lumbar three through five decompression with lumbar three through five insitu fusion;  Surgeon: [name], MD;  Location: [facility];  Service: Orthopedics;  Laterality: N/A;   LUMBAR PERCUTANEOUS PEDICLE SCREW 2 LEVEL N/A 12/31/2023   Procedure: LUMBAR PERCUTANEOUS PEDICLE SCREW LUMBAR THREE-LUMBAR FIVE;  Surgeon: [name], MD;  Location: [facility];  Service: Neurosurgery;   Laterality: N/A;   MEMBRANE PEEL Left 03/14/2014   Procedure: MEMBRANE PEEL; ENDOLASER;  Surgeon: [name], MD;  Location: [facility];  Service: Ophthalmology;  Laterality: Left;   NM MYOVIEW  LTD  10/2015   LOW RISK. Small, fixed basal lateral defect - likely diaphragmatic attenuation. EF 69%   PARS PLANA VITRECTOMY Left 03/14/2014   Procedure: PARS PLANA VITRECTOMY WITH 25 GAUGE;  Surgeon: [name], MD;  Location: [facility] OR;  Service: Ophthalmology;  Laterality: Left;   shave  02/03/2022   angiofibroma   TONSILLECTOMY     TRANSTHORACIC ECHOCARDIOGRAM  01/07/2021   Normal EF 60 to 65%.  No R WMA.  GRII DD-moderately dilated LA..  Mildly dilated RV but normal function.  Normal RAP/CVP.  Trivial AI with mild to moderate sclerosis-no stenosis   UMBILICAL HERNIA REPAIR  2023   UPPER GASTROINTESTINAL ENDOSCOPY     WEIL OSTEOTOMY Right 09/01/2017   Procedure: RIGHT GREAT TOE CHEVRON AND WEIL OSTEOTOMY 2ND METATARSAL;  Surgeon: [name], MD;  Location: [facility];  Service: Orthopedics;  Laterality: Right;   XI ROBOTIC ASSISTED VENTRAL HERNIA N/A 03/17/2022   Procedure: XI ROBOTIC ASSISTED VENTRAL HERNIA;  Surgeon: [name], MD;  Location: [facility];  Service: General;  Laterality: N/A;   Patient Active Problem List   Diagnosis Date Noted   Chronic hepatitis C (HCC) 03/09/2024   Malnutrition of moderate degree 01/06/2024   Spondylolisthesis at L4-L5 level 12/31/2023   Supine hypertension 12/03/2023   Parkinson's disease with neurogenic orthostatic hypotension (HCC) 12/03/2023   Preop cardiovascular exam 12/03/2023   Glaucoma 11/30/2023   Retinal vein thrombosis (HCC) 11/30/2023   Acquired hallux rigidus of right foot 11/23/2022   Ventral hernia without obstruction or gangrene    Primary open angle glaucoma of left eye, mild stage 06/23/2021   Facial numbness 12/24/2020   Neovascular glaucoma, right eye, stage unspecified 12/16/2020   Lumbar radiculopathy 11/25/2020    Gastroesophageal reflux disease 09/10/2020   Dry eyes, bilateral 08/15/2020   Presbycusis of right ear 01/17/2020   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) 12/12/2019   Secondary corneal edema of right eye 12/12/2019   Ptosis of right eyelid 12/12/2019   OAB (overactive bladder) 11/29/2019   Bunion of great toe of right foot 07/14/2017   Claw toe, acquired, right 07/14/2017   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   Medication management 10/09/2016   Plantar fasciitis of right foot 02/19/2015   Depression 08/14/2014   Family history of heart disease in male family member before age 3 04/26/2014   Mild cognitive impairment 03/13/2014   Parkinsonian tremor (HCC) 02/12/2014   Hyperlipidemia with target LDL less than 100    Abdominal aortic atherosclerosis    Moderate essential hypertension 02/25/2011   Arthropathy 02/25/2011   HAV (hallux abducto valgus) 02/25/2011    ONSET DATE: 02/08/24 (referral date)  REFERRING DIAG: G20.A2 (ICD-10-CM) - Parkinson's disease without dyskinesia, with fluctuating manifestations (HCC)  THERAPY DIAG:  Dysarthria and anarthria  Dysphagia, oropharyngeal phase  Rationale for Evaluation and Treatment: Rehabilitation  SUBJECTIVE:   SUBJECTIVE STATEMENT: I can't remember what she said or what I was saying Pt accompanied by: significant other Bea  PERTINENT HISTORY: recent back surgery (L3-5) on 12/31/23, Parkinson's disease causing autonomic dysregulation and orthostatic hypotension, leading to blood pressure fluctuations and dizziness upon standing. Lumbar radiculopathy (Chronic), PVD, HLD, spinal stenosis, BPH   Post-surgical recovery complicated by inappropriate medication management and inadequate rehabilitation, with delirium post-anesthesia likely exacerbated by Parkinson's disease.     PAIN:  Are you having pain? No   PATIENT GOALS: To remember what was said                                                                                                                              TREATMENT DATE:    04/26/24:  Garrel enters with SPEAK OUT! Booklet - He has completed lessons consistently since receiving the booklet. Today, targeted intelligibility and volume using e Library A Year of Intent - October 1st week. Garrel required consistent modeling and cueing to complete each exercise correctly and with the most intent. Sustained Ah - 85dB, counting 82dB. Reading - 72dB, conversation exercises - 68dB - Garrel required frequent mod semantic cues and phrase completion cues to name 5 items in given categories and frequent mod verbal cues and modeling to use intent when cognitive load increases.   03/1724: Garrel received his SPEAK OUT! Booklet. They have watched the What is Parkinson's video and will have their children watch it as well.  Targeted volume and intelligibility using Speak Out! Lesson 2. Pt required initial usual mod verbal cues, modeling, for volume and breath support, fading to occasional min A.  Pt averages the following volume levels:  Sustained AH: 84dB  Counting:80dB  Reading (phrases): 75dB  Cognitive Exercise: 72dB with frequent mod A Required  verbal cues, modeling for carryover of intent /volume answering simple questions following structured practice. Generated list of personally relevant phrases to practice with intent, including name, number DOB, address, restaurant orders and script explaining that he has PD and may have trouble speaking- See Patient instructions   04/04/24: Evaluation completed. Introduced [employer] - ordered [employer] OUT! Booklet and [employer]. Demonstrated navigating website to locate What is Parkinsons video and home practice sessions. Initiated SPEAK OUT! Lesson 1 to  target volume, intelligibility and speaking with intent as well as cognitive communication exercises. With usual min to mod A, [name] averaged  79dB on sustained Ah, 76dB on glides, 78dB counting and 75dB reading. In conversation/cognitive exercise, with verbal cues and modeling, he averaged 71dB. Instructed them to complete Lesson one again in [employer], as well at to complete home practice sessions on website.    PATIENT EDUCATION: Education details: HEP for dysarthria, swallow precautions, compensations for slow processing and following conversation Person educated: Patient and Spouse Education method: Explanation, Facilities manager, Verbal cues, and Handouts Education  comprehension: verbal cues required and needs further education  HOME EXERCISE PROGRAM: SPEAK OUT!   GOALS: Goals reviewed with patient? Yes  SHORT TERM GOALS: Target date: 05/29/24 (extended due to scheduling)  Pt will complete HEP for dysarthria with occasional min A over 2 sessions Baseline: Goal status: ONGOING  2.  Pt will average 74dB 18/20 sentences Baseline:  Goal status: ONGOING  3.  Pt will follow swallow precautions with occasional min A Baseline:  Goal status: ONGOING  4.  Pt and spouse will carryover 2 compensatory strategies to support slow processing  Baseline:  Goal status: ONGOING  5.  Pt will average 72dB over 8 minute conversation Baseline:  Goal status: ONGOING   LONG TERM GOALS: Target date: 06/27/24  Pt will complete HEP with rare min A over 2 sessions Baseline:  Goal status: ONGOING  2.  Pt will complete 3 online home practice sessions Baseline:  Goal status: ONGOING  3.  Pt will access E Library and complete 3 lessons over 2 weeks with min A from spouse Baseline:  Goal status: ONGOING  4.  Pt will report no episodes of globus sensation with pills Baseline:  Goal status: ONGOING  5.  Pt will average 72dB over 15 minute conversation with rare min A Baseline:  Goal status: ONGOING  6.  Pt and spouse will carry over 4 compensatory strategies to support slow processing in conversation and directions Baseline:   Goal status:ONGOING  ASSESSMENT:  CLINICAL IMPRESSION: Patient is a 73 y.o. 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 They report that Garrel freezes when speaking, has episodes of dysfluency and looses the topic.Bea reports slow follow through  and difficulty following more than one direction at a time. Garrel endorses difficulty maintaining his train of thought and following conversations. He is using a calendar to recall appointments, he is using the microwave and coffee maker independently. Bea reports increased difficulty using their remote control system and that she tells him to put out the dog, and he forgets to let the dog back in. Overall volume low affecting intelligibility. Garrel passed Yale swallow test, however he reports pills get stuck and he is having more difficulty swallowing solids. Will monitor and order MBSS if needed. I recommend skilled ST to maximize intelligibility, safety of swallow and cognition for safety, to reduce caregiver burden and improve participation in conversations for QOL. .   OBJECTIVE IMPAIRMENTS: Objective impairments include attention, memory, awareness, executive functioning, dysarthria, and dysphagia. These impairments are limiting patient from managing medications, managing appointments, managing finances, household responsibilities, ADLs/IADLs, effectively communicating at home and in community, and safety when swallowing.Factors affecting potential to achieve goals and functional outcome are ability to learn/carryover information and medical prognosis.. Patient will benefit from skilled SLP services to address above impairments and improve overall function.  REHAB POTENTIAL: Good  PLAN:  SLP FREQUENCY: 1-2x/week  SLP DURATION: 12 weeks  PLANNED INTERVENTIONS: Aspiration precaution training, Pharyngeal strengthening exercises, Diet toleration management , Language  facilitation, Environmental controls, Trials of upgraded texture/liquids, Cueing hierachy, Cognitive reorganization, Internal/external aids, Functional tasks, Multimodal communication approach, SLP instruction and feedback, Compensatory strategies, Patient/family education, [phone] Treatment of speech (30 or 45 min) , [id number] Treatment of swallowing function, [phone]- Speech [address], Artic, Phon, Eval Compre, Express, and MBSS    Hridaan Bouse, Leita Caldron, CCC-SLP 04/26/2024, 10:21 AM

## 2024-04-28 ENCOUNTER — Other Ambulatory Visit (HOSPITAL_COMMUNITY): Payer: Self-pay

## 2024-04-28 ENCOUNTER — Other Ambulatory Visit: Payer: Self-pay | Admitting: Family Medicine

## 2024-04-28 ENCOUNTER — Other Ambulatory Visit: Payer: Self-pay

## 2024-04-28 MED ORDER — DORZOLAMIDE HCL-TIMOLOL MAL 2-0.5 % OP SOLN
1.0000 [drp] | Freq: Two times a day (BID) | OPHTHALMIC | 11 refills | Status: AC
  Filled 2024-04-28: qty 10, 50d supply, fill #0
  Filled 2024-06-14: qty 10, 50d supply, fill #1
  Filled 2024-07-25: qty 10, 25d supply, fill #2
  Filled 2024-07-25: qty 10, 50d supply, fill #2
  Filled 2024-08-28: qty 10, 25d supply, fill #3

## 2024-05-01 ENCOUNTER — Other Ambulatory Visit: Payer: Self-pay

## 2024-05-01 ENCOUNTER — Ambulatory Visit: Admitting: Adult Health

## 2024-05-01 ENCOUNTER — Encounter: Payer: Self-pay | Admitting: Adult Health

## 2024-05-01 DIAGNOSIS — H547 Unspecified visual loss: Secondary | ICD-10-CM

## 2024-05-01 DIAGNOSIS — G20A2 Parkinson's disease without dyskinesia, with fluctuations: Secondary | ICD-10-CM

## 2024-05-02 ENCOUNTER — Encounter: Payer: Self-pay | Admitting: Speech Pathology

## 2024-05-02 ENCOUNTER — Ambulatory Visit: Admitting: Speech Pathology

## 2024-05-02 ENCOUNTER — Other Ambulatory Visit (HOSPITAL_COMMUNITY): Payer: Self-pay

## 2024-05-02 DIAGNOSIS — R1312 Dysphagia, oropharyngeal phase: Secondary | ICD-10-CM

## 2024-05-02 DIAGNOSIS — R471 Dysarthria and anarthria: Secondary | ICD-10-CM | POA: Diagnosis not present

## 2024-05-02 DIAGNOSIS — R41841 Cognitive communication deficit: Secondary | ICD-10-CM

## 2024-05-02 NOTE — Therapy (Signed)
 OUTPATIENT SPEECH LANGUAGE PATHOLOGY PARKINSON'S TREATMENT   Patient Name: [name] MRN: [id number] DOB:20-Mar-1951, 73 y.o., male Today's Date: 05/02/2024  PCP: [name], MD REFERRING PROVIDER: [name], NP  END OF SESSION:  End of Session - 05/02/24 1107     Visit Number 5    Number of Visits 13    Date for Recertification  06/27/24    Authorization Type [employer] Medicare - requested auth    SLP Start Time 1103    SLP Stop Time  1145    SLP Time Calculation (min) 42 min    Activity Tolerance Patient tolerated treatment well          Past Medical History:  Diagnosis Date   Allergy    seasonal   Anxiety    Arthritis    BPH (benign prostatic hyperplasia)    Complication of anesthesia    Pt. stated he had a reaction that ended in him requiring urinary cath placement; hx delirium worsening parkinson symptoms   Depression    Diverticulosis    GERD (gastroesophageal reflux disease)    esophageal spasms   Glaucoma    Gout    Head injury, closed, with concussion    Hepatitis C    chronic - Has been treated with Harvoni   HLD (hyperlipidemia)    statin intolerant (Crestor  & Simvastatin ) - Taking Livalo  1mg  / week   Hypertension    Neuromuscular disorder (HCC)    Parkinson's Disease   Parkinson's disease (HCC)    Plantar fasciitis    right   PVD (peripheral vascular disease)    With no claudication; only mild abdominal aortic atherosclerosis noted on ultrasound.   Sleep apnea    Wears CPAP nightly   Spinal stenosis of lumbar region    Thoracic ascending aortic aneurysm    4.2 cm ascending TAA 09/2016 CT, 1 yr f/u rec   Ulcer    Past Surgical History:  Procedure Laterality Date   ANTERIOR LAT LUMBAR FUSION N/A 12/31/2023   Procedure: Prone trans-psoas interbody fusion - Lumbar three-Lumbar four - Lumbar four-Lumbar five, right facetectomy Lumbar four-five;  Surgeon: [name], MD;  Location: [facility];  Service: Neurosurgery;  Laterality: N/A;    BUNIONECTOMY WITH WEIL OSTEOTOMY Right 11/02/2019   Procedure: Right Foot Lapidus, Modified McBride Bunionectomy,  Hallux Akin Osteotomy;  Surgeon: [name], MD;  Location: [facility];  Service: Orthopedics;  Laterality: Right;   CARDIAC CATHETERIZATION  2005   30% Cx. Dr. Lavon   cataract surgery Left 11/05/2014   COLONOSCOPY     COLONOSCOPY N/A 01/09/2021   Procedure: COLONOSCOPY;  Surgeon: [name], MD;  Location: [facility];  Service: Gastroenterology;  Laterality: N/A;   ESOPHAGOGASTRODUODENOSCOPY N/A 05/02/2015   Procedure: ESOPHAGOGASTRODUODENOSCOPY (EGD);  Surgeon: [name], MD;  Location: [facility];  Service: Endoscopy;  Laterality: N/A;   ESOPHAGOGASTRODUODENOSCOPY  05/02/2015   no source of pt chest pain endoscopically evident. small hiatal hernia.   ESOPHAGOGASTRODUODENOSCOPY (EGD) WITH PROPOFOL  N/A 01/02/2021   Procedure: ESOPHAGOGASTRODUODENOSCOPY (EGD) WITH PROPOFOL ;  Surgeon: [name], MD;  Location: [facility];  Service: Endoscopy;  Laterality: N/A;   HARDWARE REMOVAL Right 11/02/2019   Procedure: Second Metatarsal Removal of Deep Implant and Rotational Osteotomy;  Surgeon: [name], MD;  Location: [facility];  Service: Orthopedics;  Laterality: Right;   IR ANGIOGRAM SELECTIVE EACH ADDITIONAL VESSEL  01/04/2021   IR ANGIOGRAM SELECTIVE EACH ADDITIONAL VESSEL  01/04/2021   IR ANGIOGRAM VISCERAL SELECTIVE  01/04/2021   IR US  GUIDE VASC ACCESS RIGHT  01/04/2021   LUMBAR LAMINECTOMY/DECOMPRESSION MICRODISCECTOMY N/A 07/03/2021   Procedure: Lumbar three through five decompression with lumbar three through five insitu fusion;  Surgeon: [name], MD;  Location: [facility];  Service: Orthopedics;  Laterality: N/A;   LUMBAR PERCUTANEOUS PEDICLE SCREW 2 LEVEL N/A 12/31/2023   Procedure: LUMBAR PERCUTANEOUS PEDICLE SCREW LUMBAR THREE-LUMBAR FIVE;  Surgeon: [name], MD;  Location: [facility];  Service: Neurosurgery;   Laterality: N/A;   MEMBRANE PEEL Left 03/14/2014   Procedure: MEMBRANE PEEL; ENDOLASER;  Surgeon: [name], MD;  Location: [facility];  Service: Ophthalmology;  Laterality: Left;   NM MYOVIEW  LTD  10/2015   LOW RISK. Small, fixed basal lateral defect - likely diaphragmatic attenuation. EF 69%   PARS PLANA VITRECTOMY Left 03/14/2014   Procedure: PARS PLANA VITRECTOMY WITH 25 GAUGE;  Surgeon: [name], MD;  Location: [facility] OR;  Service: Ophthalmology;  Laterality: Left;   shave  02/03/2022   angiofibroma   TONSILLECTOMY     TRANSTHORACIC ECHOCARDIOGRAM  01/07/2021   Normal EF 60 to 65%.  No R WMA.  GRII DD-moderately dilated LA..  Mildly dilated RV but normal function.  Normal RAP/CVP.  Trivial AI with mild to moderate sclerosis-no stenosis   UMBILICAL HERNIA REPAIR  2023   UPPER GASTROINTESTINAL ENDOSCOPY     WEIL OSTEOTOMY Right 09/01/2017   Procedure: RIGHT GREAT TOE CHEVRON AND WEIL OSTEOTOMY 2ND METATARSAL;  Surgeon: [name], MD;  Location: [facility];  Service: Orthopedics;  Laterality: Right;   XI ROBOTIC ASSISTED VENTRAL HERNIA N/A 03/17/2022   Procedure: XI ROBOTIC ASSISTED VENTRAL HERNIA;  Surgeon: [name], MD;  Location: [facility];  Service: General;  Laterality: N/A;   Patient Active Problem List   Diagnosis Date Noted   Chronic hepatitis C (HCC) 03/09/2024   Malnutrition of moderate degree 01/06/2024   Spondylolisthesis at L4-L5 level 12/31/2023   Supine hypertension 12/03/2023   Parkinson's disease with neurogenic orthostatic hypotension (HCC) 12/03/2023   Preop cardiovascular exam 12/03/2023   Glaucoma 11/30/2023   Retinal vein thrombosis (HCC) 11/30/2023   Acquired hallux rigidus of right foot 11/23/2022   Ventral hernia without obstruction or gangrene    Primary open angle glaucoma of left eye, mild stage 06/23/2021   Facial numbness 12/24/2020   Neovascular glaucoma, right eye, stage unspecified 12/16/2020   Lumbar radiculopathy 11/25/2020    Gastroesophageal reflux disease 09/10/2020   Dry eyes, bilateral 08/15/2020   Presbycusis of right ear 01/17/2020   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) 12/12/2019   Secondary corneal edema of right eye 12/12/2019   Ptosis of right eyelid 12/12/2019   OAB (overactive bladder) 11/29/2019   Bunion of great toe of right foot 07/14/2017   Claw toe, acquired, right 07/14/2017   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   Medication management 10/09/2016   Plantar fasciitis of right foot 02/19/2015   Depression 08/14/2014   Family history of heart disease in male family member before age 56 04/26/2014   Mild cognitive impairment 03/13/2014   Parkinsonian tremor (HCC) 02/12/2014   Hyperlipidemia with target LDL less than 100    Abdominal aortic atherosclerosis    Moderate essential hypertension 02/25/2011   Arthropathy 02/25/2011   HAV (hallux abducto valgus) 02/25/2011    ONSET DATE: 02/08/24 (referral date)  REFERRING DIAG: G20.A2 (ICD-10-CM) - Parkinson's disease without dyskinesia, with fluctuating manifestations (HCC)  THERAPY DIAG:  Dysarthria and anarthria  Dysphagia, oropharyngeal phase  Cognitive communication deficit  Rationale for Evaluation and Treatment: Rehabilitation  SUBJECTIVE:   SUBJECTIVE STATEMENT: I can't remember what she said or what I was saying Pt accompanied by: significant other Bea  PERTINENT HISTORY: recent back surgery (L3-5) on 12/31/23, Parkinson's disease causing autonomic dysregulation and orthostatic hypotension, leading to blood pressure fluctuations and dizziness upon standing. Lumbar radiculopathy (Chronic), PVD, HLD, spinal stenosis, BPH   Post-surgical recovery complicated by inappropriate medication management and inadequate rehabilitation, with delirium post-anesthesia likely exacerbated by Parkinson's disease.     PAIN:  Are you having pain? No   PATIENT GOALS: To  remember what was said                                                                                                                             TREATMENT DATE:   05/02/24: Garrel enters with sub WNL volume. Targeted memory of SPEAK OUT! Exercises - with usual mod cues to recall exercises and to read instructions and required reps. He required initial modeling to use intent with Ah to average 88dB, 84dB on glides, 80dB on counting . Generated 6 more personally relevant sentences - Targeted reading and speaking with intent reading 4 words and explaining which does not belong and why - reading averaged 74dB, explanation averaged 70dB with usual min to mod verbal cues and modeling. Homework continues to be to complete an online home practice session. In   04/26/24:  Garrel enters with SPEAK OUT! Booklet - He has completed lessons consistently since receiving the booklet. Today, targeted intelligibility and volume using e Library A Year of Intent - October 1st week. Garrel required consistent modeling and cueing to complete each exercise correctly and with the most intent. Sustained Ah - 85dB, counting 82dB. Reading - 72dB, conversation exercises - 68dB - Garrel required frequent mod semantic cues and phrase completion cues to name 5 items in given categories and frequent mod verbal cues and modeling to use intent when cognitive load increases.   03/1724: Garrel received his SPEAK OUT! Booklet. They have watched the What is Parkinson's video and will have their children watch it as well.  Targeted volume and intelligibility using Speak Out! Lesson 2. Pt required initial usual mod verbal cues, modeling, for volume and breath support, fading to occasional min A.  Pt averages the following volume levels:  Sustained AH: 84dB  Counting:80dB  Reading (phrases): 75dB  Cognitive Exercise: 72dB with frequent mod A Required  verbal cues, modeling for carryover of intent /volume answering simple questions following  structured practice. Generated list of personally relevant phrases to practice with intent, including name, number DOB, address, restaurant orders and script explaining that he has PD and may have trouble speaking- See Patient instructions   04/04/24: Evaluation completed. Introduced [employer] - ordered [employer] OUT! Booklet and [employer]. Demonstrated navigating website to locate What is Parkinsons video and home practice sessions.  Initiated SPEAK OUT! Lesson 1 to  target volume, intelligibility and speaking with intent as well as cognitive communication exercises. With usual min to mod A, [name] averaged 79dB on sustained Ah, 76dB on glides, 78dB counting and 75dB reading. In conversation/cognitive exercise, with verbal cues and modeling, he averaged 71dB. Instructed them to complete Lesson one again in [employer], as well at to complete home practice sessions on website.    PATIENT EDUCATION: Education details: HEP for dysarthria, swallow precautions, compensations for slow processing and following conversation Person educated: Patient and Spouse Education method: Programmer, multimedia, Facilities manager, Verbal cues, and Handouts Education comprehension: verbal cues required and needs further education  HOME EXERCISE PROGRAM: SPEAK OUT!   GOALS: Goals reviewed with patient? Yes  SHORT TERM GOALS: Target date: 05/29/24 (extended due to scheduling)  Pt will complete HEP for dysarthria with occasional min A over 2 sessions Baseline: Goal status: ONGOING  2.  Pt will average 74dB 18/20 sentences Baseline:  Goal status: ONGOING  3.  Pt will follow swallow precautions with occasional min A Baseline:  Goal status: ONGOING  4.  Pt and spouse will carryover 2 compensatory strategies to support slow processing  Baseline:  Goal status: ONGOING  5.  Pt will average 72dB over 8 minute conversation Baseline:  Goal status: ONGOING   LONG TERM GOALS: Target date: 06/27/24  Pt  will complete HEP with rare min A over 2 sessions Baseline:  Goal status: ONGOING  2.  Pt will complete 3 online home practice sessions Baseline:  Goal status: ONGOING  3.  Pt will access E Library and complete 3 lessons over 2 weeks with min A from spouse Baseline:  Goal status: ONGOING  4.  Pt will report no episodes of globus sensation with pills Baseline:  Goal status: ONGOING  5.  Pt will average 72dB over 15 minute conversation with rare min A Baseline:  Goal status: ONGOING  6.  Pt and spouse will carry over 4 compensatory strategies to support slow processing in conversation and directions Baseline:  Goal status:ONGOING  ASSESSMENT:  CLINICAL IMPRESSION: Patient is a 73 y.o. 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 They report that Garrel freezes when speaking, has episodes of dysfluency and looses the topic.Bea reports slow follow through  and difficulty following more than one direction at a time. Garrel endorses difficulty maintaining his train of thought and following conversations. He is using a calendar to recall appointments, he is using the microwave and coffee maker independently. Bea reports increased difficulty using their remote control system and that she tells him to put out the dog, and he forgets to let the dog back in. Overall volume low affecting intelligibility. Garrel passed Yale swallow test, however he reports pills get stuck and he is having more difficulty swallowing solids. Will monitor and order MBSS if needed. I recommend skilled ST to maximize intelligibility, safety of swallow and cognition for safety, to reduce caregiver burden and improve participation in conversations for QOL. .   OBJECTIVE IMPAIRMENTS: Objective impairments include attention, memory, awareness, executive functioning, dysarthria, and dysphagia. These impairments are limiting patient from managing  medications, managing appointments, managing finances, household responsibilities, ADLs/IADLs, effectively communicating at home and in community, and safety when swallowing.Factors affecting potential to achieve goals and functional outcome are ability to learn/carryover information and medical prognosis.. Patient will benefit from skilled SLP services to address above impairments and improve overall function.  REHAB POTENTIAL: Good  PLAN:  SLP FREQUENCY: 1-2x/week  SLP DURATION: 12 weeks  PLANNED INTERVENTIONS: Aspiration precaution training, Pharyngeal strengthening exercises, Diet toleration management , Language facilitation, Environmental controls, Trials of upgraded texture/liquids, Cueing hierachy, Cognitive reorganization, Internal/external aids, Functional tasks, Multimodal communication approach, SLP instruction and feedback, Compensatory strategies, Patient/family education, [phone] Treatment of speech (30 or 45 min) , [id number] Treatment of swallowing function, [phone]- Speech [address] Artic, Phon, Eval Compre, Express, and MBSS    Shahzain Kiester, Leita Caldron, CCC-SLP 05/02/2024, 11:46 AM

## 2024-05-02 NOTE — Patient Instructions (Addendum)
  Practice your personal sentences that we have made each session  Bring in your [facility] notebook with the other sentences we made  I'll have the original chicken sandwich and fries  I'll take a diet [facility] or diet Dr. Nunzio Harari [place] - How are you?  I like the [name]  I like the team that Reatha is on, the [employer]  The [place] and [place] have good style

## 2024-05-03 ENCOUNTER — Telehealth: Payer: Self-pay | Admitting: Adult Health

## 2024-05-03 ENCOUNTER — Other Ambulatory Visit: Payer: Self-pay

## 2024-05-03 NOTE — Telephone Encounter (Signed)
 Referral to Ophthalmology faxed to [name]  Phone : [phone] Fax:[phone]

## 2024-05-04 NOTE — Telephone Encounter (Signed)
 Received fax from [facility] on 05/03/24: Neuro-ophthalmic evaluation. Please inform your patient of the following information: Appointment date and time: 06/09/24 at 11:30 am

## 2024-05-05 ENCOUNTER — Other Ambulatory Visit: Payer: Self-pay

## 2024-05-05 ENCOUNTER — Encounter: Payer: Self-pay | Admitting: Physical Therapy

## 2024-05-05 ENCOUNTER — Ambulatory Visit: Admitting: Physical Therapy

## 2024-05-05 VITALS — BP 131/76 | HR 57

## 2024-05-05 DIAGNOSIS — M6281 Muscle weakness (generalized): Secondary | ICD-10-CM

## 2024-05-05 DIAGNOSIS — R29818 Other symptoms and signs involving the nervous system: Secondary | ICD-10-CM

## 2024-05-05 DIAGNOSIS — R471 Dysarthria and anarthria: Secondary | ICD-10-CM | POA: Diagnosis not present

## 2024-05-05 DIAGNOSIS — R2681 Unsteadiness on feet: Secondary | ICD-10-CM

## 2024-05-05 NOTE — Therapy (Signed)
 OUTPATIENT PHYSICAL THERAPY NEURO TREATMENT/RE-CERT/DISCHARGE SUMMARY   Patient Name: [name] MRN: [id number] DOB:[birth year], 73 y.o., male Today's Date: 05/05/2024   PCP: [name], MD   REFERRING PROVIDER: Caleen Dirks, MD (sent to Harlene Bogaert, NP)    END OF SESSION:  PT End of Session - 05/05/24 1325     Visit Number 12    Number of Visits 13    Date for Recertification  06/04/24   per re-cert on 89/89/74   Authorization Type [employer] Medicare    PT Start Time 1323   pt late to session   PT Stop Time 1356   full time not used due to D/C visit   PT Time Calculation (min) 33 min    Equipment Utilized During Treatment Gait belt    Activity Tolerance Patient tolerated treatment well    Behavior During Therapy WFL for tasks assessed/performed          Past Medical History:  Diagnosis Date   Allergy    seasonal   Anxiety    Arthritis    BPH (benign prostatic hyperplasia)    Complication of anesthesia    Pt. stated he had a reaction that ended in him requiring urinary cath placement; hx delirium worsening parkinson symptoms   Depression    Diverticulosis    GERD (gastroesophageal reflux disease)    esophageal spasms   Glaucoma    Gout    Head injury, closed, with concussion    Hepatitis C    chronic - Has been treated with Harvoni   HLD (hyperlipidemia)    statin intolerant (Crestor  & Simvastatin ) - Taking Livalo  1mg  / week   Hypertension    Neuromuscular disorder (HCC)    Parkinson's Disease   Parkinson's disease (HCC)    Plantar fasciitis    right   PVD (peripheral vascular disease)    With no claudication; only mild abdominal aortic atherosclerosis noted on ultrasound.   Sleep apnea    Wears CPAP nightly   Spinal stenosis of lumbar region    Thoracic ascending aortic aneurysm    4.2 cm ascending TAA 09/2016 CT, 1 yr f/u rec   Ulcer    Past Surgical History:  Procedure Laterality Date   ANTERIOR LAT LUMBAR FUSION N/A 12/31/2023   Procedure:  Prone trans-psoas interbody fusion - Lumbar three-Lumbar four - Lumbar four-Lumbar five, right facetectomy Lumbar four-five;  Surgeon: [name], MD;  Location: [facility];  Service: Neurosurgery;  Laterality: N/A;   BUNIONECTOMY WITH WEIL OSTEOTOMY Right 11/02/2019   Procedure: Right Foot Lapidus, Modified McBride Bunionectomy,  Hallux Akin Osteotomy;  Surgeon: [name], MD;  Location: [facility];  Service: Orthopedics;  Laterality: Right;   CARDIAC CATHETERIZATION  2005   30% Cx. Dr. Lavon   cataract surgery Left 11/05/2014   COLONOSCOPY     COLONOSCOPY N/A 01/09/2021   Procedure: COLONOSCOPY;  Surgeon: [name], MD;  Location: [facility];  Service: Gastroenterology;  Laterality: N/A;   ESOPHAGOGASTRODUODENOSCOPY N/A 05/02/2015   Procedure: ESOPHAGOGASTRODUODENOSCOPY (EGD);  Surgeon: [name], MD;  Location: [facility];  Service: Endoscopy;  Laterality: N/A;   ESOPHAGOGASTRODUODENOSCOPY  05/02/2015   no source of pt chest pain endoscopically evident. small hiatal hernia.   ESOPHAGOGASTRODUODENOSCOPY (EGD) WITH PROPOFOL  N/A 01/02/2021   Procedure: ESOPHAGOGASTRODUODENOSCOPY (EGD) WITH PROPOFOL ;  Surgeon: [name], MD;  Location: [facility];  Service: Endoscopy;  Laterality: N/A;   HARDWARE REMOVAL Right 11/02/2019   Procedure: Second Metatarsal Removal of Deep Implant and  Rotational Osteotomy;  Surgeon: Kit Rush, MD;  Location: [facility];  Service: Orthopedics;  Laterality: Right;   IR ANGIOGRAM SELECTIVE EACH ADDITIONAL VESSEL  01/04/2021   IR ANGIOGRAM SELECTIVE EACH ADDITIONAL VESSEL  01/04/2021   IR ANGIOGRAM VISCERAL SELECTIVE  01/04/2021   IR US  GUIDE VASC ACCESS RIGHT  01/04/2021   LUMBAR LAMINECTOMY/DECOMPRESSION MICRODISCECTOMY N/A 07/03/2021   Procedure: Lumbar three through five decompression with lumbar three through five insitu fusion;  Surgeon: [name], MD;  Location: [facility] OR;  Service: Orthopedics;  Laterality:  N/A;   LUMBAR PERCUTANEOUS PEDICLE SCREW 2 LEVEL N/A 12/31/2023   Procedure: LUMBAR PERCUTANEOUS PEDICLE SCREW LUMBAR THREE-LUMBAR FIVE;  Surgeon: [name], MD;  Location: [facility];  Service: Neurosurgery;  Laterality: N/A;   MEMBRANE PEEL Left 03/14/2014   Procedure: MEMBRANE PEEL; ENDOLASER;  Surgeon: [name], MD;  Location: [facility];  Service: Ophthalmology;  Laterality: Left;   NM MYOVIEW  LTD  10/2015   LOW RISK. Small, fixed basal lateral defect - likely diaphragmatic attenuation. EF 69%   PARS PLANA VITRECTOMY Left 03/14/2014   Procedure: PARS PLANA VITRECTOMY WITH 25 GAUGE;  Surgeon: [name], MD;  Location: [facility];  Service: Ophthalmology;  Laterality: Left;   shave  02/03/2022   angiofibroma   TONSILLECTOMY     TRANSTHORACIC ECHOCARDIOGRAM  01/07/2021   Normal EF 60 to 65%.  No R WMA.  GRII DD-moderately dilated LA..  Mildly dilated RV but normal function.  Normal RAP/CVP.  Trivial AI with mild to moderate sclerosis-no stenosis   UMBILICAL HERNIA REPAIR  2023   UPPER GASTROINTESTINAL ENDOSCOPY     WEIL OSTEOTOMY Right 09/01/2017   Procedure: RIGHT GREAT TOE CHEVRON AND WEIL OSTEOTOMY 2ND METATARSAL;  Surgeon: [name], MD;  Location: [facility];  Service: Orthopedics;  Laterality: Right;   XI ROBOTIC ASSISTED VENTRAL HERNIA N/A 03/17/2022   Procedure: XI ROBOTIC ASSISTED VENTRAL HERNIA;  Surgeon: [name], MD;  Location: [facility];  Service: General;  Laterality: N/A;   Patient Active Problem List   Diagnosis Date Noted   Chronic hepatitis C (HCC) 03/09/2024   Malnutrition of moderate degree 01/06/2024   Spondylolisthesis at L4-L5 level 12/31/2023   Supine hypertension 12/03/2023   Parkinson's disease with neurogenic orthostatic hypotension (HCC) 12/03/2023   Preop cardiovascular exam 12/03/2023   Glaucoma 11/30/2023   Retinal vein thrombosis (HCC) 11/30/2023   Acquired hallux rigidus of right foot 11/23/2022   Ventral hernia without obstruction or gangrene     Primary open angle glaucoma of left eye, mild stage 06/23/2021   Facial numbness 12/24/2020   Neovascular glaucoma, right eye, stage unspecified 12/16/2020   Lumbar radiculopathy 11/25/2020   Gastroesophageal reflux disease 09/10/2020   Dry eyes, bilateral 08/15/2020   Presbycusis of right ear 01/17/2020   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) 12/12/2019   Secondary corneal edema of right eye 12/12/2019   Ptosis of right eyelid 12/12/2019   OAB (overactive bladder) 11/29/2019   Bunion of great toe of right foot 07/14/2017   Claw toe, acquired, right 07/14/2017   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   Medication management 10/09/2016   Plantar fasciitis of right foot 02/19/2015   Depression 08/14/2014   Family history of heart disease in male family member before age 72 04/26/2014   Mild cognitive impairment 03/13/2014   Parkinsonian tremor (HCC) 02/12/2014   Hyperlipidemia with target LDL less than 100    Abdominal  aortic atherosclerosis    Moderate essential hypertension 02/25/2011   Arthropathy 02/25/2011   HAV (hallux abducto valgus) 02/25/2011    ONSET DATE: 01/19/2024  REFERRING DIAG: M43.16 (ICD-10-CM) - Spondylolisthesis, lumbar region  THERAPY DIAG:  Unsteadiness on feet  Other symptoms and signs involving the nervous system  Muscle weakness (generalized)  Rationale for Evaluation and Treatment: Rehabilitation  SUBJECTIVE:                                                                                                                                                                                             SUBJECTIVE STATEMENT: Cardiologist got him started on Midodrine  daily to help with the BP. Pt's spouse reports that he is not as sharp as he was after surgery. Has been working a lot in speech therapy for memory. Did some walking at the [facility]. Got referred to neuro-ophthalmology at [facility] in November   Pt accompanied by: self and pt's spouse  PERTINENT HISTORY: PMH: s/p L3-5 prone trans-psoas approach for lateral interbody fusion; R L4-5 facetectomy and posterior segmental instrumentation L3-5 (12/2023). PMH significant for PD (complicated by tremor, gait impairment, REM sleep behavior and cognitive impairment ), HTN, DM, gout, glaucoma, PVD   Per note from [facility]: He underwent back surgery on 6/6 (L2-5 lateral interbody fusion with posterior instrumentation) with postop confusion and delirium requiring restraints,  improved with use of Seroquel  and discontinuing opioids and muscle relaxants, suspected confusion due to GA, PD and possibly medication side effects.  He was discharged on Seroquel  25/50.  He was discharged to [facility] rehab on 6/18, has not yet started therapies. He continues to have occasional confusion although some what improving per wife, at times with paranoia and visual hallucinations, also has been wandering and currently wearing locator band on ankle   PAIN:  Are you having pain? No   PRECAUTIONS: Back, Fall, and Other: hx of orthostatics, pt's spouse has to remind him of his precautions    FALLS: Has patient fallen in last 6 months? Yes. Number of falls 1, had a fall getting up and fell back against the window, this happened about a week or 2 before the surgery   LIVING ENVIRONMENT: Lives with: lives with their spouse Lives in: House/apartment Stairs: Yes: Internal: 15 steps; on right going up and External: 2 on the front porch and 3 on the side steps; can reach both, doesn't have to go up indoor steps, can live on the bottom floor  Has following equipment at home: Vannie - 2 wheeled, shower chair, and Grab bars  PLOF: Independent and Leisure: music, traveling Pt's spouse  has to remind him of brace precautions for ADLs   PATIENT GOALS: Wants to be able to be more independent, stronger, and more mobile   OBJECTIVE:  Note: Objective  measures were completed at Evaluation unless otherwise noted.  COGNITION: Overall cognitive status: Impaired   SENSATION: Light touch: WFL Reports numbness/tingling intermittently down front of BLE   COORDINATION: Heel to shin: slightly harder with LLE   POSTURE: rounded shoulders and forward head   LOWER EXTREMITY MMT:    MMT Right Eval Left Eval  Hip flexion 5 4+  Hip extension    Hip abduction 4 4  Hip adduction 4+ 4+  Hip internal rotation    Hip external rotation    Knee flexion 4+ 4+  Knee extension 4 -4  Ankle dorsiflexion 5 5  Ankle plantarflexion    Ankle inversion    Ankle eversion    (Blank rows = not tested)  All tested in sitting   BED MOBILITY:  Pt reports will feel stuck trying to get his legs in and out of the bed. Reviewed the log roll technique for bed mobility (PT demonstrating on the bed)  TRANSFERS: Sit to stand: SBA  Assistive device utilized: None     Stand to sit: SBA  Assistive device utilized: None      Mainly performs with UE support, but can perform without, with intermittent retropulsion  GAIT: Findings: Gait Characteristics: step through pattern, decreased arm swing- Right, decreased arm swing- Left, decreased stride length, decreased ankle dorsiflexion- Right, decreased ankle dorsiflexion- Left, Right foot flat, Left foot flat, shuffling, decreased trunk rotation, and narrow BOS, Distance walked: Clinic distances , Assistive device utilized:None, Level of assistance: SBA and CGA, and Comments: intermittent CGA during turns due to pt with narrow BOS   FUNCTIONAL TESTS:  5 times sit to stand: 21.4 seconds with no UE support, last rep pt with an episode of retropulsion  10 meter walk test: 11.5 seconds with no AD = 2.85 ft/sec                                                                                                                                  TREATMENT:     Ther Act/Self-Care:  Vitals:   05/05/24 1339  BP: 131/76   Pulse: (!) 57    Reviewed PD community exercise classes, pt attending PD dance classes and has attended support groups in the past, provided PD community resources handout for any other classes or online resources  Discussed using RW as needed for balance or a day when pt's BP is more low to help with balance (pt previously used RW in previous bout of therapy before pt got his back surgery)   Getting scheduled for PD screens in 6 months and purpose of screens  Use of recumbent bike at the gym for aerobic exercise/reciprocal movement patterns. Pt has a [employer] and has been walking on the track with  his spouse    5x sit <> stand: 13.3 seconds with no UE support TUG: 10.1 seconds with no AD  Cog TUG: 10.3 seconds, starting at 75 backwards by 3 Gait speed: 10.5 seconds with no AD  Reviewed from HEP:  Pt performs PWR! Moves in standing position x 10 reps - with chair in front of pt as needed for balanc e   PWR! Up for improved posture  PWR! Rock for improved weighshifting  PWR! Twist for improved trunk rotation   PWR! Step for improved step initiation   Cues provided for looking at hands    PATIENT EDUCATION: Education details: see above, D/C from PT  Person educated: Patient and Spouse Education method: Explanation, Verbal cues, and Handouts Education comprehension: verbalized understanding and needs further education   PHYSICAL THERAPY DISCHARGE SUMMARY  Visits from Start of Care: 12  Current functional level related to goals / functional outcomes: See LTGs/Clinical Assessment Statement   Remaining deficits: Impaired timing/coordination of gait, impaired balance, decr functional strength, cognitive impairments    Education / Equipment: HEP, fall prevention, BP education with orthostatics, PD education    Patient agrees to discharge. Patient goals were met. Patient is being discharged due to maximized rehab potential.  Will schedule PD screens in 6 months     HOME EXERCISE PROGRAM: Standing PWR Moves  Access Code: [email or web address] URL: [email or web address] Date: 03/07/2024 Prepared by: Sheffield Senate  Exercises - Sit to Stand  - 1-2 x daily - 4-5 x weekly - 2 sets - 10 reps  GOALS: Goals reviewed with patient? Yes  SHORT TERM GOALS: Target date: 02/29/2024  Pt will be independent with initial HEP for improved strength, balance, transfers and gait. Baseline: Goal status: MET  2.  miniBEST to be assessed with LTG written.  Baseline: 20/28 Goal status: MET  3.  Pt will improve 5x sit<>stand to less than or equal to 18 sec to demonstrate improved functional strength and transfer efficiency.   Baseline: 21.4 seconds with no UE support, last rep pt with an episode of retropulsion   15.4 seconds with no UE support  Goal status: MET    LONG TERM GOALS: Target date: 03/21/2024 Updated goal date to reflect POC/last appt date: 03/30/24  Pt will verbalize understanding of local PD community resources, including fitness post DC.  Baseline:  Goal status: IN PROGRESS  2.  Pt will improve miniBEST to at least a 23/28 in order to demo decr fall risk.  Baseline: 20/28; 19/28 (9/4) Goal status: NOT MET   3.  Pt will perform cog TUG in 18 seconds or less in order to demo improved dual tasking.  Baseline: 22 seconds; 21.4s (9/4) Goal status: IN PROGRESS   4.  Pt will improve gait speed with no AD vs. LRAD to at least 3.1 ft/sec in order to demo improved community mobility.   Baseline: 11.5 seconds with no AD = 2.85 ft/sec  10.9 seconds with no AD = 3.0 ft/sec (8/25) 10.22s w/no AD = 3.21 ft/s no AD (9/4)  Goal status: MET   5.  Pt will improve 5x sit<>stand to less than or equal to 16 sec to demonstrate improved functional strength and transfer efficiency.  Baseline: 21.4 seconds with no UE support, last rep pt with an episode of retropulsion   14.6 seconds with no UE support (8/25) Goal status: MET   NEW LONG TERM  GOALS:  Target date: 04/27/2024  Pt will verbalize understanding of local PD community resources, including fitness post  DC.  Baseline:  Goal status: MET  2.   Pt will perform cog TUG in 18 seconds or less in order to demo improved dual tasking.  Baseline: 22 seconds; 21.4s (9/4)  10.3 seconds with starting at 75 backwards by 3 Goal status: MET    ASSESSMENT:  CLINICAL IMPRESSION: Today's skilled session focused on checking LTGs for anticipated D/C. Pt has met both LTGs in regards to cog TUG and community resources. Pt is currently attending PD dance classes and has been walking at the [facility] with his spouse. Reviewed HEP for standing PWR moves with pt's spouse present with pt having no questions or concerns at this time. Pt has reached his max potential with PT at this time and plan to get PD screens scheduled in 6 months. Pt and pt's spouse in agreement with plan.   OBJECTIVE IMPAIRMENTS: Abnormal gait, decreased activity tolerance, decreased balance, decreased cognition, decreased coordination, decreased endurance, decreased mobility, difficulty walking, decreased strength, decreased safety awareness, hypomobility, impaired sensation, and postural dysfunction.   ACTIVITY LIMITATIONS: carrying, lifting, bending, stairs, transfers, and locomotion level  PARTICIPATION LIMITATIONS: driving, shopping, community activity, and yard work  PERSONAL FACTORS: Age, Behavior pattern, Past/current experiences, Time since onset of injury/illness/exacerbation, and 3+ comorbidities: s/p L3-5 prone trans-psoas approach for lateral interbody fusion; R L4-5 facetectomy and posterior segmental instrumentation L3-5 (12/2023). PMH significant for PD (complicated by tremor, gait impairment, REM sleep behavior and cognitive impairment ), HTN, DM, gout, glaucoma, PVD, orthostatic hypotension  are also affecting patient's functional outcome.   REHAB POTENTIAL: Good  CLINICAL DECISION MAKING: Evolving/moderate  complexity  EVALUATION COMPLEXITY: Moderate  PLAN:  PT FREQUENCY: 2x/week  PT DURATION: 8 weeks - anticipate just 6 weeks   Plus an additional 1x week for 4 weeks for re-cert  PLANNED INTERVENTIONS: 97164- PT Re-evaluation, 97750- Physical Performance Testing, 97110-Therapeutic exercises, 97530- Therapeutic activity, [id number]- Neuromuscular re-education, 97535- Self Care, [id number]- Manual therapy, [phone]- Gait training, Patient/Family education, Balance training, Stair training, and DME instructions  PLAN FOR NEXT SESSION: D/C with PD screens in 6 months    Sheffield LOISE Senate, PT, DPT 05/05/2024, 2:07 PM

## 2024-05-08 ENCOUNTER — Other Ambulatory Visit (HOSPITAL_COMMUNITY): Payer: Self-pay

## 2024-05-08 ENCOUNTER — Ambulatory Visit: Admitting: Speech Pathology

## 2024-05-08 DIAGNOSIS — R471 Dysarthria and anarthria: Secondary | ICD-10-CM

## 2024-05-08 DIAGNOSIS — R41841 Cognitive communication deficit: Secondary | ICD-10-CM

## 2024-05-08 DIAGNOSIS — R1312 Dysphagia, oropharyngeal phase: Secondary | ICD-10-CM

## 2024-05-08 MED ORDER — ZOSTER VAC RECOMB ADJUVANTED 50 MCG/0.5ML IM SUSR
0.5000 mL | Freq: Once | INTRAMUSCULAR | 1 refills | Status: AC
  Filled 2024-05-08: qty 0.5, 1d supply, fill #0

## 2024-05-08 NOTE — Therapy (Signed)
 OUTPATIENT SPEECH LANGUAGE PATHOLOGY PARKINSON'S TREATMENT   Patient Name: [name] MRN: [id number] DOB:19-Sep-1950, 73 y.o., male Today's Date: 05/08/2024  PCP: [name], MD REFERRING PROVIDER: [name], NP  END OF SESSION:  End of Session - 05/08/24 1152     Visit Number 6    Number of Visits 13    Date for Recertification  06/27/24    Authorization Type [employer] Medicare - requested auth    SLP Start Time 1153   pt arrived late to session   SLP Stop Time  1230    SLP Time Calculation (min) 37 min    Activity Tolerance Patient tolerated treatment well           Past Medical History:  Diagnosis Date   Allergy    seasonal   Anxiety    Arthritis    BPH (benign prostatic hyperplasia)    Complication of anesthesia    Pt. stated he had a reaction that ended in him requiring urinary cath placement; hx delirium worsening parkinson symptoms   Depression    Diverticulosis    GERD (gastroesophageal reflux disease)    esophageal spasms   Glaucoma    Gout    Head injury, closed, with concussion    Hepatitis C    chronic - Has been treated with Harvoni   HLD (hyperlipidemia)    statin intolerant (Crestor  & Simvastatin ) - Taking Livalo  1mg  / week   Hypertension    Neuromuscular disorder (HCC)    Parkinson's Disease   Parkinson's disease (HCC)    Plantar fasciitis    right   PVD (peripheral vascular disease)    With no claudication; only mild abdominal aortic atherosclerosis noted on ultrasound.   Sleep apnea    Wears CPAP nightly   Spinal stenosis of lumbar region    Thoracic ascending aortic aneurysm    4.2 cm ascending TAA 09/2016 CT, 1 yr f/u rec   Ulcer    Past Surgical History:  Procedure Laterality Date   ANTERIOR LAT LUMBAR FUSION N/A 12/31/2023   Procedure: Prone trans-psoas interbody fusion - Lumbar three-Lumbar four - Lumbar four-Lumbar five, right facetectomy Lumbar four-five;  Surgeon: [name], MD;  Location: [facility];  Service:  Neurosurgery;  Laterality: N/A;   BUNIONECTOMY WITH WEIL OSTEOTOMY Right 11/02/2019   Procedure: Right Foot Lapidus, Modified McBride Bunionectomy,  Hallux Akin Osteotomy;  Surgeon: [name], MD;  Location: [facility];  Service: Orthopedics;  Laterality: Right;   CARDIAC CATHETERIZATION  2005   30% Cx. Dr. Lavon   cataract surgery Left 11/05/2014   COLONOSCOPY     COLONOSCOPY N/A 01/09/2021   Procedure: COLONOSCOPY;  Surgeon: [name], MD;  Location: [facility];  Service: Gastroenterology;  Laterality: N/A;   ESOPHAGOGASTRODUODENOSCOPY N/A 05/02/2015   Procedure: ESOPHAGOGASTRODUODENOSCOPY (EGD);  Surgeon: [name], MD;  Location: [facility];  Service: Endoscopy;  Laterality: N/A;   ESOPHAGOGASTRODUODENOSCOPY  05/02/2015   no source of pt chest pain endoscopically evident. small hiatal hernia.   ESOPHAGOGASTRODUODENOSCOPY (EGD) WITH PROPOFOL  N/A 01/02/2021   Procedure: ESOPHAGOGASTRODUODENOSCOPY (EGD) WITH PROPOFOL ;  Surgeon: [name], MD;  Location: [facility];  Service: Endoscopy;  Laterality: N/A;   HARDWARE REMOVAL Right 11/02/2019   Procedure: Second Metatarsal Removal of Deep Implant and Rotational Osteotomy;  Surgeon: [name], MD;  Location: [facility];  Service: Orthopedics;  Laterality: Right;   IR ANGIOGRAM SELECTIVE EACH ADDITIONAL VESSEL  01/04/2021   IR ANGIOGRAM SELECTIVE EACH ADDITIONAL VESSEL  01/04/2021   IR ANGIOGRAM VISCERAL SELECTIVE  01/04/2021   IR US  GUIDE VASC ACCESS RIGHT  01/04/2021   LUMBAR LAMINECTOMY/DECOMPRESSION MICRODISCECTOMY N/A 07/03/2021   Procedure: Lumbar three through five decompression with lumbar three through five insitu fusion;  Surgeon: [name], MD;  Location: [facility];  Service: Orthopedics;  Laterality: N/A;   LUMBAR PERCUTANEOUS PEDICLE SCREW 2 LEVEL N/A 12/31/2023   Procedure: LUMBAR PERCUTANEOUS PEDICLE SCREW LUMBAR THREE-LUMBAR FIVE;  Surgeon: [name], MD;  Location: [facility];   Service: Neurosurgery;  Laterality: N/A;   MEMBRANE PEEL Left 03/14/2014   Procedure: MEMBRANE PEEL; ENDOLASER;  Surgeon: [name], MD;  Location: [facility];  Service: Ophthalmology;  Laterality: Left;   NM MYOVIEW  LTD  10/2015   LOW RISK. Small, fixed basal lateral defect - likely diaphragmatic attenuation. EF 69%   PARS PLANA VITRECTOMY Left 03/14/2014   Procedure: PARS PLANA VITRECTOMY WITH 25 GAUGE;  Surgeon: [name], MD;  Location: [facility] OR;  Service: Ophthalmology;  Laterality: Left;   shave  02/03/2022   angiofibroma   TONSILLECTOMY     TRANSTHORACIC ECHOCARDIOGRAM  01/07/2021   Normal EF 60 to 65%.  No R WMA.  GRII DD-moderately dilated LA..  Mildly dilated RV but normal function.  Normal RAP/CVP.  Trivial AI with mild to moderate sclerosis-no stenosis   UMBILICAL HERNIA REPAIR  2023   UPPER GASTROINTESTINAL ENDOSCOPY     WEIL OSTEOTOMY Right 09/01/2017   Procedure: RIGHT GREAT TOE CHEVRON AND WEIL OSTEOTOMY 2ND METATARSAL;  Surgeon: [name], MD;  Location: [facility];  Service: Orthopedics;  Laterality: Right;   XI ROBOTIC ASSISTED VENTRAL HERNIA N/A 03/17/2022   Procedure: XI ROBOTIC ASSISTED VENTRAL HERNIA;  Surgeon: [name], MD;  Location: [facility];  Service: General;  Laterality: N/A;   Patient Active Problem List   Diagnosis Date Noted   Chronic hepatitis C (HCC) 03/09/2024   Malnutrition of moderate degree 01/06/2024   Spondylolisthesis at L4-L5 level 12/31/2023   Supine hypertension 12/03/2023   Parkinson's disease with neurogenic orthostatic hypotension (HCC) 12/03/2023   Preop cardiovascular exam 12/03/2023   Glaucoma 11/30/2023   Retinal vein thrombosis (HCC) 11/30/2023   Acquired hallux rigidus of right foot 11/23/2022   Ventral hernia without obstruction or gangrene    Primary open angle glaucoma of left eye, mild stage 06/23/2021   Facial numbness 12/24/2020   Neovascular glaucoma, right eye, stage unspecified 12/16/2020   Lumbar radiculopathy  11/25/2020   Gastroesophageal reflux disease 09/10/2020   Dry eyes, bilateral 08/15/2020   Presbycusis of right ear 01/17/2020   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) 12/12/2019   Secondary corneal edema of right eye 12/12/2019   Ptosis of right eyelid 12/12/2019   OAB (overactive bladder) 11/29/2019   Bunion of great toe of right foot 07/14/2017   Claw toe, acquired, right 07/14/2017   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   Medication management 10/09/2016   Plantar fasciitis of right foot 02/19/2015   Depression 08/14/2014   Family history of heart disease in male family member before age 55 04/26/2014   Mild cognitive impairment 03/13/2014   Parkinsonian tremor (HCC) 02/12/2014   Hyperlipidemia with target LDL less than 100    Abdominal aortic atherosclerosis    Moderate essential hypertension 02/25/2011   Arthropathy 02/25/2011   HAV (hallux abducto valgus) 02/25/2011    ONSET DATE: 02/08/24 (referral date)  REFERRING DIAG: G20.A2 (ICD-10-CM) - Parkinson's disease without dyskinesia,  with fluctuating manifestations (HCC)  THERAPY DIAG:  Dysarthria and anarthria  Dysphagia, oropharyngeal phase  Cognitive communication deficit  Rationale for Evaluation and Treatment: Rehabilitation  SUBJECTIVE:   SUBJECTIVE STATEMENT: Probably 6-8 practicing sessions since last visit. Reports a mix of online and workbook activities.  Pt accompanied by: significant other [name]  PERTINENT HISTORY: recent back surgery (L3-5) on 12/31/23, Parkinson's disease causing autonomic dysregulation and orthostatic hypotension, leading to blood pressure fluctuations and dizziness upon standing. Lumbar radiculopathy (Chronic), PVD, HLD, spinal stenosis, BPH   Post-surgical recovery complicated by inappropriate medication management and inadequate rehabilitation, with delirium post-anesthesia likely exacerbated by Parkinson's disease.      PAIN:  Are you having pain? No   PATIENT GOALS: To remember what was said                                                                                                                           TREATMENT DATE:   05/08/24: Target improving vocal quality and increasing intensity through progressively difficulty speech tasks using Speak Out! program, lesson [employer] from [employer] online book. ST leads pt through exercises providing usual model prior to pt execution. occasional mod-A required to achieve target dB this date. Averages this date:  Resonant Vowels: 86 dB  Sustained ah 86 dB -- cues needed to avoid volume decay; unable to mitigate the volume decay with high to low glide  Counting: 81 dB  Reading 76 dB (visual challenges appeared to be a barrier)  Cognitive speech task 78 dB -- no cues needed Conversational sample of approx 10 minutes, pt averages 71 dB with occasional min-A.  05/02/24: [name] enters with sub WNL volume. Targeted memory of SPEAK OUT! Exercises - with usual mod cues to recall exercises and to read instructions and required reps. He required initial modeling to use intent with Ah to average 88dB, 84dB on glides, 80dB on counting . Generated 6 more personally relevant sentences - Targeted reading and speaking with intent reading 4 words and explaining which does not belong and why - reading averaged 74dB, explanation averaged 70dB with usual min to mod verbal cues and modeling. Homework continues to be to complete an online home practice session. In   04/26/24:  [name] enters with SPEAK OUT! Booklet - He has completed lessons consistently since receiving the booklet. Today, targeted intelligibility and volume using e Library A Year of Intent - October 1st week. [name] required consistent modeling and cueing to complete each exercise correctly and with the most intent. Sustained Ah - 85dB, counting 82dB. Reading - 72dB, conversation exercises - 68dB - [name]  required frequent mod semantic cues and phrase completion cues to name 5 items in given categories and frequent mod verbal cues and modeling to use intent when cognitive load increases.   03/1724: [name] received his SPEAK OUT! Booklet. They have watched the What is Parkinson's video and will have their children watch it as well.  Targeted volume and  intelligibility using Speak Out! Lesson 2. Pt required initial usual mod verbal cues, modeling, for volume and breath support, fading to occasional min A.  Pt averages the following volume levels:  Sustained AH: 84dB  Counting:80dB  Reading (phrases): 75dB  Cognitive Exercise: 72dB with frequent mod A Required  verbal cues, modeling for carryover of intent /volume answering simple questions following structured practice. Generated list of personally relevant phrases to practice with intent, including name, number DOB, address, restaurant orders and script explaining that he has PD and may have trouble speaking- See Patient instructions   04/04/24: Evaluation completed. Introduced [employer] - ordered [employer] OUT! Booklet and [employer]. Demonstrated navigating website to locate What is Parkinsons video and home practice sessions. Initiated SPEAK OUT! Lesson 1 to  target volume, intelligibility and speaking with intent as well as cognitive communication exercises. With usual min to mod A, [name] averaged 79dB on sustained Ah, 76dB on glides, 78dB counting and 75dB reading. In conversation/cognitive exercise, with verbal cues and modeling, he averaged 71dB. Instructed them to complete Lesson one again in [employer], as well at to complete home practice sessions on website.    PATIENT EDUCATION: Education details: HEP for dysarthria, swallow precautions, compensations for slow processing and following conversation Person educated: Patient and Spouse Education method: Programmer, multimedia, Facilities manager, Verbal cues, and Handouts Education  comprehension: verbal cues required and needs further education  HOME EXERCISE PROGRAM: SPEAK OUT!   GOALS: Goals reviewed with patient? Yes  SHORT TERM GOALS: Target date: 05/29/24 (extended due to scheduling)  Pt will complete HEP for dysarthria with occasional min A over 2 sessions Baseline: Goal status: ONGOING  2.  Pt will average 74dB 18/20 sentences Baseline:  Goal status: ONGOING  3.  Pt will follow swallow precautions with occasional min A Baseline:  Goal status: ONGOING  4.  Pt and spouse will carryover 2 compensatory strategies to support slow processing  Baseline:  Goal status: ONGOING  5.  Pt will average 72dB over 8 minute conversation Baseline:  Goal status: ONGOING   LONG TERM GOALS: Target date: 06/27/24  Pt will complete HEP with rare min A over 2 sessions Baseline:  Goal status: ONGOING  2.  Pt will complete 3 online home practice sessions Baseline:  Goal status: ONGOING  3.  Pt will access E Library and complete 3 lessons over 2 weeks with min A from spouse Baseline:  Goal status: ONGOING  4.  Pt will report no episodes of globus sensation with pills Baseline:  Goal status: ONGOING  5.  Pt will average 72dB over 15 minute conversation with rare min A Baseline:  Goal status: ONGOING  6.  Pt and spouse will carry over 4 compensatory strategies to support slow processing in conversation and directions Baseline:  Goal status:ONGOING  ASSESSMENT:  CLINICAL IMPRESSION: Patient is a 73 y.o. 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 They report that [name] freezes when speaking, has episodes of dysfluency and looses the topic.[name] reports slow follow through  and difficulty following more than one direction at a time. [name] endorses difficulty maintaining his train of thought and following conversations. He is using a calendar to recall appointments, he is using  the microwave and coffee maker independently. [name] reports increased difficulty using their remote control system and that she tells him to put out the dog, and he forgets to let the dog back in. Overall volume low affecting  intelligibility. [name] passed Yale swallow test, however he reports pills get stuck and he is having more difficulty swallowing solids. Will monitor and order MBSS if needed. I recommend skilled ST to maximize intelligibility, safety of swallow and cognition for safety, to reduce caregiver burden and improve participation in conversations for QOL. .   OBJECTIVE IMPAIRMENTS: Objective impairments include attention, memory, awareness, executive functioning, dysarthria, and dysphagia. These impairments are limiting patient from managing medications, managing appointments, managing finances, household responsibilities, ADLs/IADLs, effectively communicating at home and in community, and safety when swallowing.Factors affecting potential to achieve goals and functional outcome are ability to learn/carryover information and medical prognosis.. Patient will benefit from skilled SLP services to address above impairments and improve overall function.  REHAB POTENTIAL: Good  PLAN:  SLP FREQUENCY: 1-2x/week  SLP DURATION: 12 weeks  PLANNED INTERVENTIONS: Aspiration precaution training, Pharyngeal strengthening exercises, Diet toleration management , Language facilitation, Environmental controls, Trials of upgraded texture/liquids, Cueing hierachy, Cognitive reorganization, Internal/external aids, Functional tasks, Multimodal communication approach, SLP instruction and feedback, Compensatory strategies, Patient/family education, [phone] Treatment of speech (30 or 45 min) , [id number] Treatment of swallowing function, [phone] Speech [address] Artic, Phon, Eval Compre, Express, and MBSS    Harlene LITTIE Ned, CCC-SLP 05/08/2024, 11:53 AM

## 2024-05-09 ENCOUNTER — Other Ambulatory Visit (HOSPITAL_COMMUNITY): Payer: Self-pay

## 2024-05-09 ENCOUNTER — Other Ambulatory Visit: Payer: Self-pay

## 2024-05-10 ENCOUNTER — Other Ambulatory Visit: Payer: Self-pay | Admitting: Family Medicine

## 2024-05-10 ENCOUNTER — Other Ambulatory Visit: Payer: Self-pay

## 2024-05-10 ENCOUNTER — Other Ambulatory Visit (HOSPITAL_COMMUNITY): Payer: Self-pay

## 2024-05-10 MED ORDER — ATORVASTATIN CALCIUM 20 MG PO TABS
20.0000 mg | ORAL_TABLET | Freq: Every day | ORAL | 3 refills | Status: AC
  Filled 2024-05-10: qty 30, 30d supply, fill #0
  Filled 2024-06-05: qty 30, 30d supply, fill #1
  Filled 2024-07-10: qty 30, 30d supply, fill #2
  Filled 2024-08-08: qty 30, 30d supply, fill #3

## 2024-05-11 ENCOUNTER — Other Ambulatory Visit (HOSPITAL_COMMUNITY): Payer: Self-pay

## 2024-05-11 ENCOUNTER — Other Ambulatory Visit: Payer: Self-pay

## 2024-05-12 ENCOUNTER — Other Ambulatory Visit (HOSPITAL_COMMUNITY): Payer: Self-pay

## 2024-05-12 ENCOUNTER — Other Ambulatory Visit (HOSPITAL_BASED_OUTPATIENT_CLINIC_OR_DEPARTMENT_OTHER): Payer: Self-pay

## 2024-05-14 ENCOUNTER — Other Ambulatory Visit (HOSPITAL_COMMUNITY): Payer: Self-pay

## 2024-05-14 ENCOUNTER — Encounter: Payer: Self-pay | Admitting: Adult Health

## 2024-05-15 ENCOUNTER — Other Ambulatory Visit: Payer: Self-pay

## 2024-05-16 ENCOUNTER — Other Ambulatory Visit: Payer: Self-pay

## 2024-05-19 ENCOUNTER — Other Ambulatory Visit: Payer: Self-pay

## 2024-05-19 ENCOUNTER — Other Ambulatory Visit (HOSPITAL_COMMUNITY): Payer: Self-pay

## 2024-05-23 ENCOUNTER — Other Ambulatory Visit (HOSPITAL_COMMUNITY): Payer: Self-pay

## 2024-05-24 ENCOUNTER — Encounter: Payer: Self-pay | Admitting: Speech Pathology

## 2024-05-24 ENCOUNTER — Ambulatory Visit: Admitting: Speech Pathology

## 2024-05-24 ENCOUNTER — Other Ambulatory Visit: Payer: Self-pay

## 2024-05-24 DIAGNOSIS — R41841 Cognitive communication deficit: Secondary | ICD-10-CM

## 2024-05-24 DIAGNOSIS — R471 Dysarthria and anarthria: Secondary | ICD-10-CM

## 2024-05-24 NOTE — Therapy (Signed)
 OUTPATIENT SPEECH LANGUAGE PATHOLOGY PARKINSON'S TREATMENT   Patient Name: [name] MRN: [id number] DOB:Jul 10, 1951, 73 y.o., male Today's Date: 05/24/2024  PCP: [name], MD REFERRING PROVIDER: [name], NP  END OF SESSION:  End of Session - 05/24/24 1017     Visit Number 7    Number of Visits 13    Date for Recertification  06/27/24    Authorization Type [facility] medicare 13 visits through 07/18/24    SLP Start Time 1017    SLP Stop Time  1100    SLP Time Calculation (min) 43 min    Activity Tolerance Patient tolerated treatment well           Past Medical History:  Diagnosis Date   Allergy    seasonal   Anxiety    Arthritis    BPH (benign prostatic hyperplasia)    Complication of anesthesia    Pt. stated he had a reaction that ended in him requiring urinary cath placement; hx delirium worsening parkinson symptoms   Depression    Diverticulosis    GERD (gastroesophageal reflux disease)    esophageal spasms   Glaucoma    Gout    Head injury, closed, with concussion    Hepatitis C    chronic - Has been treated with Harvoni   HLD (hyperlipidemia)    statin intolerant (Crestor  & Simvastatin ) - Taking Livalo  1mg  / week   Hypertension    Neuromuscular disorder (HCC)    Parkinson's Disease   Parkinson's disease (HCC)    Plantar fasciitis    right   PVD (peripheral vascular disease)    With no claudication; only mild abdominal aortic atherosclerosis noted on ultrasound.   Sleep apnea    Wears CPAP nightly   Spinal stenosis of lumbar region    Thoracic ascending aortic aneurysm    4.2 cm ascending TAA 09/2016 CT, 1 yr f/u rec   Ulcer    Past Surgical History:  Procedure Laterality Date   ANTERIOR LAT LUMBAR FUSION N/A 12/31/2023   Procedure: Prone trans-psoas interbody fusion - Lumbar three-Lumbar four - Lumbar four-Lumbar five, right facetectomy Lumbar four-five;  Surgeon: [name], MD;  Location: [facility];  Service: Neurosurgery;   Laterality: N/A;   BUNIONECTOMY WITH WEIL OSTEOTOMY Right 11/02/2019   Procedure: Right Foot Lapidus, Modified McBride Bunionectomy,  Hallux Akin Osteotomy;  Surgeon: [name], MD;  Location: [facility];  Service: Orthopedics;  Laterality: Right;   CARDIAC CATHETERIZATION  2005   30% Cx. Dr. Lavon   cataract surgery Left 11/05/2014   COLONOSCOPY     COLONOSCOPY N/A 01/09/2021   Procedure: COLONOSCOPY;  Surgeon: [name], MD;  Location: [facility];  Service: Gastroenterology;  Laterality: N/A;   ESOPHAGOGASTRODUODENOSCOPY N/A 05/02/2015   Procedure: ESOPHAGOGASTRODUODENOSCOPY (EGD);  Surgeon: [name], MD;  Location: [facility];  Service: Endoscopy;  Laterality: N/A;   ESOPHAGOGASTRODUODENOSCOPY  05/02/2015   no source of pt chest pain endoscopically evident. small hiatal hernia.   ESOPHAGOGASTRODUODENOSCOPY (EGD) WITH PROPOFOL  N/A 01/02/2021   Procedure: ESOPHAGOGASTRODUODENOSCOPY (EGD) WITH PROPOFOL ;  Surgeon: [name], MD;  Location: [facility];  Service: Endoscopy;  Laterality: N/A;   HARDWARE REMOVAL Right 11/02/2019   Procedure: Second Metatarsal Removal of Deep Implant and Rotational Osteotomy;  Surgeon: [name], MD;  Location: [facility];  Service: Orthopedics;  Laterality: Right;   IR ANGIOGRAM SELECTIVE EACH ADDITIONAL VESSEL  01/04/2021   IR ANGIOGRAM SELECTIVE EACH ADDITIONAL VESSEL  01/04/2021   IR ANGIOGRAM  VISCERAL SELECTIVE  01/04/2021   IR US  GUIDE VASC ACCESS RIGHT  01/04/2021   LUMBAR LAMINECTOMY/DECOMPRESSION MICRODISCECTOMY N/A 07/03/2021   Procedure: Lumbar three through five decompression with lumbar three through five insitu fusion;  Surgeon: [name], MD;  Location: [facility] OR;  Service: Orthopedics;  Laterality: N/A;   LUMBAR PERCUTANEOUS PEDICLE SCREW 2 LEVEL N/A 12/31/2023   Procedure: LUMBAR PERCUTANEOUS PEDICLE SCREW LUMBAR THREE-LUMBAR FIVE;  Surgeon: [name], MD;  Location: [facility];  Service:  Neurosurgery;  Laterality: N/A;   MEMBRANE PEEL Left 03/14/2014   Procedure: MEMBRANE PEEL; ENDOLASER;  Surgeon: [name], MD;  Location: [facility];  Service: Ophthalmology;  Laterality: Left;   NM MYOVIEW  LTD  10/2015   LOW RISK. Small, fixed basal lateral defect - likely diaphragmatic attenuation. EF 69%   PARS PLANA VITRECTOMY Left 03/14/2014   Procedure: PARS PLANA VITRECTOMY WITH 25 GAUGE;  Surgeon: [name], MD;  Location[facility];  Service: Ophthalmology;  Laterality: Left;   shave  02/03/2022   angiofibroma   TONSILLECTOMY     TRANSTHORACIC ECHOCARDIOGRAM  01/07/2021   Normal EF 60 to 65%.  No R WMA.  GRII DD-moderately dilated LA..  Mildly dilated RV but normal function.  Normal RAP/CVP.  Trivial AI with mild to moderate sclerosis-no stenosis   UMBILICAL HERNIA REPAIR  2023   UPPER GASTROINTESTINAL ENDOSCOPY     WEIL OSTEOTOMY Right 09/01/2017   Procedure: RIGHT GREAT TOE CHEVRON AND WEIL OSTEOTOMY 2ND METATARSAL;  Surgeon: [name], MD;  Location: [facility];  Service: Orthopedics;  Laterality: Right;   XI ROBOTIC ASSISTED VENTRAL HERNIA N/A 03/17/2022   Procedure: XI ROBOTIC ASSISTED VENTRAL HERNIA;  Surgeon: [name], MD;  Location: [facility];  Service: General;  Laterality: N/A;   Patient Active Problem List   Diagnosis Date Noted   Chronic hepatitis C (HCC) 03/09/2024   Malnutrition of moderate degree 01/06/2024   Spondylolisthesis at L4-L5 level 12/31/2023   Supine hypertension 12/03/2023   Parkinson's disease with neurogenic orthostatic hypotension (HCC) 12/03/2023   Preop cardiovascular exam 12/03/2023   Glaucoma 11/30/2023   Retinal vein thrombosis (HCC) 11/30/2023   Acquired hallux rigidus of right foot 11/23/2022   Ventral hernia without obstruction or gangrene    Primary open angle glaucoma of left eye, mild stage 06/23/2021   Facial numbness 12/24/2020   Neovascular glaucoma, right eye, stage unspecified 12/16/2020   Lumbar radiculopathy 11/25/2020    Gastroesophageal reflux disease 09/10/2020   Dry eyes, bilateral 08/15/2020   Presbycusis of right ear 01/17/2020   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) 12/12/2019   Secondary corneal edema of right eye 12/12/2019   Ptosis of right eyelid 12/12/2019   OAB (overactive bladder) 11/29/2019   Bunion of great toe of right foot 07/14/2017   Claw toe, acquired, right 07/14/2017   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   Medication management 10/09/2016   Plantar fasciitis of right foot 02/19/2015   Depression 08/14/2014   Family history of heart disease in male family member before age 59 04/26/2014   Mild cognitive impairment 03/13/2014   Parkinsonian tremor (HCC) 02/12/2014   Hyperlipidemia with target LDL less than 100    Abdominal aortic atherosclerosis    Moderate essential hypertension 02/25/2011   Arthropathy 02/25/2011   HAV (hallux abducto valgus) 02/25/2011    ONSET DATE: 02/08/24 (referral date)  REFERRING DIAG: G20.A2 (ICD-10-CM) - Parkinson's disease without dyskinesia, with fluctuating manifestations (HCC)  THERAPY DIAG:  Dysarthria and anarthria  Cognitive communication deficit  Rationale for Evaluation and Treatment: Rehabilitation  SUBJECTIVE:   SUBJECTIVE STATEMENT: Probably 6-8 practicing sessions since last visit. Reports a mix of online and workbook activities.  Pt accompanied by: significant other Bea  PERTINENT HISTORY: recent back surgery (L3-5) on 12/31/23, Parkinson's disease causing autonomic dysregulation and orthostatic hypotension, leading to blood pressure fluctuations and dizziness upon standing. Lumbar radiculopathy (Chronic), PVD, HLD, spinal stenosis, BPH   Post-surgical recovery complicated by inappropriate medication management and inadequate rehabilitation, with delirium post-anesthesia likely exacerbated by Parkinson's disease.     PAIN:  Are you having pain?  No   PATIENT GOALS: To remember what was said                                                                                                                           TREATMENT DATE:   05/24/24: Target improving vocal quality and increasing intensity through progressively difficulty speech tasks using Speak Out! program, Lesson October week 3 - he averaged 75dB on reading halloween sentences - modified conversation task to divergent naming costumes - he averaged 74dB with rare min A - he required usual min to mod semantic cues to name 18 costumes. Reading 3 pages of his personally relevant phrases with average of 74dB and rare min A - occasional extended time to read phrase correctly.   05/08/24: Target improving vocal quality and increasing intensity through progressively difficulty speech tasks using Speak Out! program, lesson [place] [employer] from [employer] online book. ST leads pt through exercises providing usual model prior to pt execution. occasional mod-A required to achieve target dB this date. Averages this date:  Resonant Vowels: 86 dB  Sustained ah 86 dB -- cues needed to avoid volume decay; unable to mitigate the volume decay with high to low glide  Counting: 81 dB  Reading 76 dB (visual challenges appeared to be a barrier)  Cognitive speech task 78 dB -- no cues needed Conversational sample of approx 10 minutes, pt averages 71 dB with occasional min-A.  05/02/24: Garrel enters with sub WNL volume. Targeted memory of SPEAK OUT! Exercises - with usual mod cues to recall exercises and to read instructions and required reps. He required initial modeling to use intent with Ah to average 88dB, 84dB on glides, 80dB on counting . Generated 6 more personally relevant sentences - Targeted reading and speaking with intent reading 4 words and explaining which does not belong and why - reading averaged 74dB, explanation averaged 70dB with usual min to mod verbal cues and modeling. Homework  continues to be to complete an online home practice session. In   04/26/24:  Garrel enters with SPEAK OUT! Booklet - He has completed lessons consistently since receiving the booklet. Today, targeted intelligibility and volume using e Library A Year of Intent - October 1st week. Garrel required consistent modeling and cueing to complete each exercise correctly and with the most intent.  Sustained Ah - 85dB, counting 82dB. Reading - 72dB, conversation exercises - 68dB - Garrel required frequent mod semantic cues and phrase completion cues to name 5 items in given categories and frequent mod verbal cues and modeling to use intent when cognitive load increases.   03/1724: Garrel received his SPEAK OUT! Booklet. They have watched the What is Parkinson's video and will have their children watch it as well.  Targeted volume and intelligibility using Speak Out! Lesson 2. Pt required initial usual mod verbal cues, modeling, for volume and breath support, fading to occasional min A.  Pt averages the following volume levels:  Sustained AH: 84dB  Counting:80dB  Reading (phrases): 75dB  Cognitive Exercise: 72dB with frequent mod A Required  verbal cues, modeling for carryover of intent /volume answering simple questions following structured practice. Generated list of personally relevant phrases to practice with intent, including name, number DOB, address, restaurant orders and script explaining that he has PD and may have trouble speaking- See Patient instructions   04/04/24: Evaluation completed. Introduced [employer] - ordered ANHEUSER-BUSCH OUT! Booklet and [employer]. Demonstrated navigating website to locate What is Parkinsons video and home practice sessions. Initiated SPEAK OUT! Lesson 1 to  target volume, intelligibility and speaking with intent as well as cognitive communication exercises. With usual min to mod A, [name] averaged 79dB on sustained Ah, 76dB on glides, 78dB counting and 75dB reading. In  conversation/cognitive exercise, with verbal cues and modeling, he averaged 71dB. Instructed them to complete Lesson one again in [employer], as well at to complete home practice sessions on website.    PATIENT EDUCATION: Education details: HEP for dysarthria, swallow precautions, compensations for slow processing and following conversation Person educated: Patient and Spouse Education method: Programmer, Multimedia, Facilities Manager, Verbal cues, and Handouts Education comprehension: verbal cues required and needs further education  HOME EXERCISE PROGRAM: SPEAK OUT!   GOALS: Goals reviewed with patient? Yes  SHORT TERM GOALS: Target date: 05/29/24 (extended due to scheduling)  Pt will complete HEP for dysarthria with occasional min A over 2 sessions Baseline: Goal status: ONGOING  2.  Pt will average 74dB 18/20 sentences Baseline:  Goal status: ONGOING  3.  Pt will follow swallow precautions with occasional min A Baseline:  Goal status: ONGOING  4.  Pt and spouse will carryover 2 compensatory strategies to support slow processing  Baseline:  Goal status: ONGOING  5.  Pt will average 72dB over 8 minute conversation Baseline:  Goal status: ONGOING   LONG TERM GOALS: Target date: 06/27/24  Pt will complete HEP with rare min A over 2 sessions Baseline:  Goal status: ONGOING  2.  Pt will complete 3 online home practice sessions Baseline:  Goal status: ONGOING  3.  Pt will access E Library and complete 3 lessons over 2 weeks with min A from spouse Baseline:  Goal status: ONGOING  4.  Pt will report no episodes of globus sensation with pills Baseline:  Goal status: ONGOING  5.  Pt will average 72dB over 15 minute conversation with rare min A Baseline:  Goal status: ONGOING  6.  Pt and spouse will carry over 4 compensatory strategies to support slow processing in conversation and directions Baseline:  Goal status:ONGOING  ASSESSMENT:  CLINICAL IMPRESSION: Patient is a 73  y.o. 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 They report that Garrel freezes when speaking, has episodes of dysfluency and looses the topic.Bea  reports slow follow through  and difficulty following more than one direction at a time. Garrel endorses difficulty maintaining his train of thought and following conversations. He is using a calendar to recall appointments, he is using the microwave and coffee maker independently. Bea reports increased difficulty using their remote control system and that she tells him to put out the dog, and he forgets to let the dog back in. Overall volume low affecting intelligibility. Garrel passed Yale swallow test, however he reports pills get stuck and he is having more difficulty swallowing solids. Will monitor and order MBSS if needed. I recommend skilled ST to maximize intelligibility, safety of swallow and cognition for safety, to reduce caregiver burden and improve participation in conversations for QOL. .   OBJECTIVE IMPAIRMENTS: Objective impairments include attention, memory, awareness, executive functioning, dysarthria, and dysphagia. These impairments are limiting patient from managing medications, managing appointments, managing finances, household responsibilities, ADLs/IADLs, effectively communicating at home and in community, and safety when swallowing.Factors affecting potential to achieve goals and functional outcome are ability to learn/carryover information and medical prognosis.. Patient will benefit from skilled SLP services to address above impairments and improve overall function.  REHAB POTENTIAL: Good  PLAN:  SLP FREQUENCY: 1-2x/week  SLP DURATION: 12 weeks  PLANNED INTERVENTIONS: Aspiration precaution training, Pharyngeal strengthening exercises, Diet toleration management , Language facilitation, Environmental controls, Trials of upgraded texture/liquids,  Cueing hierachy, Cognitive reorganization, Internal/external aids, Functional tasks, Multimodal communication approach, SLP instruction and feedback, Compensatory strategies, Patient/family education, [phone] Treatment of speech (30 or 45 min) , [id number] Treatment of swallowing function, [phone] Speech [address], Artic, Phon, Eval Compre, Express, and MBSS    Gee Habig, Leita Caldron, CCC-SLP 05/24/2024, 11:01 AM

## 2024-05-27 ENCOUNTER — Other Ambulatory Visit (HOSPITAL_COMMUNITY): Payer: Self-pay

## 2024-05-29 ENCOUNTER — Other Ambulatory Visit (HOSPITAL_COMMUNITY): Payer: Self-pay

## 2024-05-29 ENCOUNTER — Other Ambulatory Visit: Payer: Self-pay

## 2024-05-31 ENCOUNTER — Other Ambulatory Visit (HOSPITAL_COMMUNITY): Payer: Self-pay

## 2024-06-01 ENCOUNTER — Other Ambulatory Visit: Payer: Self-pay

## 2024-06-05 ENCOUNTER — Other Ambulatory Visit (HOSPITAL_COMMUNITY): Payer: Self-pay

## 2024-06-05 ENCOUNTER — Other Ambulatory Visit: Payer: Self-pay

## 2024-06-06 ENCOUNTER — Other Ambulatory Visit (HOSPITAL_COMMUNITY): Payer: Self-pay

## 2024-06-06 ENCOUNTER — Other Ambulatory Visit: Payer: Self-pay

## 2024-06-07 ENCOUNTER — Ambulatory Visit: Attending: Adult Health

## 2024-06-07 ENCOUNTER — Other Ambulatory Visit: Payer: Self-pay

## 2024-06-07 DIAGNOSIS — R471 Dysarthria and anarthria: Secondary | ICD-10-CM | POA: Diagnosis present

## 2024-06-07 DIAGNOSIS — R41841 Cognitive communication deficit: Secondary | ICD-10-CM | POA: Diagnosis present

## 2024-06-07 NOTE — Therapy (Signed)
 OUTPATIENT SPEECH LANGUAGE PATHOLOGY PARKINSON'S TREATMENT   Patient Name: [name] MRN: [id number] DOB:01-26-1951, 73 y.o., male Today's Date: 06/07/2024  PCP: [name], MD REFERRING PROVIDER: [name], NP  END OF SESSION:  End of Session - 06/07/24 1628     Visit Number 8    Number of Visits 13    Date for Recertification  06/27/24    Authorization Type [facility] medicare 13 visits through 07/18/24    SLP Start Time 1531    SLP Stop Time  1615    SLP Time Calculation (min) 44 min    Activity Tolerance Patient tolerated treatment well           Past Medical History:  Diagnosis Date   Allergy    seasonal   Anxiety    Arthritis    BPH (benign prostatic hyperplasia)    Complication of anesthesia    Pt. stated he had a reaction that ended in him requiring urinary cath placement; hx delirium worsening parkinson symptoms   Depression    Diverticulosis    GERD (gastroesophageal reflux disease)    esophageal spasms   Glaucoma    Gout    Head injury, closed, with concussion    Hepatitis C    chronic - Has been treated with Harvoni   HLD (hyperlipidemia)    statin intolerant (Crestor  & Simvastatin ) - Taking Livalo  1mg  / week   Hypertension    Neuromuscular disorder (HCC)    Parkinson's Disease   Parkinson's disease (HCC)    Plantar fasciitis    right   PVD (peripheral vascular disease)    With no claudication; only mild abdominal aortic atherosclerosis noted on ultrasound.   Sleep apnea    Wears CPAP nightly   Spinal stenosis of lumbar region    Thoracic ascending aortic aneurysm    4.2 cm ascending TAA 09/2016 CT, 1 yr f/u rec   Ulcer    Past Surgical History:  Procedure Laterality Date   ANTERIOR LAT LUMBAR FUSION N/A 12/31/2023   Procedure: Prone trans-psoas interbody fusion - Lumbar three-Lumbar four - Lumbar four-Lumbar five, right facetectomy Lumbar four-five;  Surgeon: [name], MD;  Location: [facility];  Service: Neurosurgery;   Laterality: N/A;   BUNIONECTOMY WITH WEIL OSTEOTOMY Right 11/02/2019   Procedure: Right Foot Lapidus, Modified McBride Bunionectomy,  Hallux Akin Osteotomy;  Surgeon: [name], MD;  Location: [facility];  Service: Orthopedics;  Laterality: Right;   CARDIAC CATHETERIZATION  2005   30% Cx. Dr. Lavon   cataract surgery Left 11/05/2014   COLONOSCOPY     COLONOSCOPY N/A 01/09/2021   Procedure: COLONOSCOPY;  Surgeon: [name], MD;  Location: [facility];  Service: Gastroenterology;  Laterality: N/A;   ESOPHAGOGASTRODUODENOSCOPY N/A 05/02/2015   Procedure: ESOPHAGOGASTRODUODENOSCOPY (EGD);  Surgeon: [name], MD;  Location: [facility];  Service: Endoscopy;  Laterality: N/A;   ESOPHAGOGASTRODUODENOSCOPY  05/02/2015   no source of pt chest pain endoscopically evident. small hiatal hernia.   ESOPHAGOGASTRODUODENOSCOPY (EGD) WITH PROPOFOL  N/A 01/02/2021   Procedure: ESOPHAGOGASTRODUODENOSCOPY (EGD) WITH PROPOFOL ;  Surgeon: [name], MD;  Location: [facility];  Service: Endoscopy;  Laterality: N/A;   HARDWARE REMOVAL Right 11/02/2019   Procedure: Second Metatarsal Removal of Deep Implant and Rotational Osteotomy;  Surgeon: [name], MD;  Location: [facility];  Service: Orthopedics;  Laterality: Right;   IR ANGIOGRAM SELECTIVE EACH ADDITIONAL VESSEL  01/04/2021   IR ANGIOGRAM SELECTIVE EACH ADDITIONAL VESSEL  01/04/2021   IR ANGIOGRAM  VISCERAL SELECTIVE  01/04/2021   IR US  GUIDE VASC ACCESS RIGHT  01/04/2021   LUMBAR LAMINECTOMY/DECOMPRESSION MICRODISCECTOMY N/A 07/03/2021   Procedure: Lumbar three through five decompression with lumbar three through five insitu fusion;  Surgeon: [name], MD;  Location: [facility] OR;  Service: Orthopedics;  Laterality: N/A;   LUMBAR PERCUTANEOUS PEDICLE SCREW 2 LEVEL N/A 12/31/2023   Procedure: LUMBAR PERCUTANEOUS PEDICLE SCREW LUMBAR THREE-LUMBAR FIVE;  Surgeon: [name], MD;  Location: [facility];  Service:  Neurosurgery;  Laterality: N/A;   MEMBRANE PEEL Left 03/14/2014   Procedure: MEMBRANE PEEL; ENDOLASER;  Surgeon: [name], MD;  Location: [facility];  Service: Ophthalmology;  Laterality: Left;   NM MYOVIEW  LTD  10/2015   LOW RISK. Small, fixed basal lateral defect - likely diaphragmatic attenuation. EF 69%   PARS PLANA VITRECTOMY Left 03/14/2014   Procedure: PARS PLANA VITRECTOMY WITH 25 GAUGE;  Surgeon: [name], MD;  Location: [facility] OR;  Service: Ophthalmology;  Laterality: Left;   shave  02/03/2022   angiofibroma   TONSILLECTOMY     TRANSTHORACIC ECHOCARDIOGRAM  01/07/2021   Normal EF 60 to 65%.  No R WMA.  GRII DD-moderately dilated LA..  Mildly dilated RV but normal function.  Normal RAP/CVP.  Trivial AI with mild to moderate sclerosis-no stenosis   UMBILICAL HERNIA REPAIR  2023   UPPER GASTROINTESTINAL ENDOSCOPY     WEIL OSTEOTOMY Right 09/01/2017   Procedure: RIGHT GREAT TOE CHEVRON AND WEIL OSTEOTOMY 2ND METATARSAL;  Surgeon: [name], MD;  Location: [facility];  Service: Orthopedics;  Laterality: Right;   XI ROBOTIC ASSISTED VENTRAL HERNIA N/A 03/17/2022   Procedure: XI ROBOTIC ASSISTED VENTRAL HERNIA;  Surgeon: [name], MD;  Location: [facility];  Service: General;  Laterality: N/A;   Patient Active Problem List   Diagnosis Date Noted   Chronic hepatitis C (HCC) 03/09/2024   Malnutrition of moderate degree 01/06/2024   Spondylolisthesis at L4-L5 level 12/31/2023   Supine hypertension 12/03/2023   Parkinson's disease with neurogenic orthostatic hypotension (HCC) 12/03/2023   Preop cardiovascular exam 12/03/2023   Glaucoma 11/30/2023   Retinal vein thrombosis (HCC) 11/30/2023   Acquired hallux rigidus of right foot 11/23/2022   Ventral hernia without obstruction or gangrene    Primary open angle glaucoma of left eye, mild stage 06/23/2021   Facial numbness 12/24/2020   Neovascular glaucoma, right eye, stage unspecified 12/16/2020   Lumbar radiculopathy 11/25/2020    Gastroesophageal reflux disease 09/10/2020   Dry eyes, bilateral 08/15/2020   Presbycusis of right ear 01/17/2020   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) 12/12/2019   Secondary corneal edema of right eye 12/12/2019   Ptosis of right eyelid 12/12/2019   OAB (overactive bladder) 11/29/2019   Bunion of great toe of right foot 07/14/2017   Claw toe, acquired, right 07/14/2017   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   Medication management 10/09/2016   Plantar fasciitis of right foot 02/19/2015   Depression 08/14/2014   Family history of heart disease in male family member before age 2 04/26/2014   Mild cognitive impairment 03/13/2014   Parkinsonian tremor (HCC) 02/12/2014   Hyperlipidemia with target LDL less than 100    Abdominal aortic atherosclerosis    Moderate essential hypertension 02/25/2011   Arthropathy 02/25/2011   HAV (hallux abducto valgus) 02/25/2011    ONSET DATE: 02/08/24 (referral date)  REFERRING DIAG: G20.A2 (ICD-10-CM) - Parkinson's disease without dyskinesia, with fluctuating manifestations (HCC)  THERAPY DIAG:  Dysarthria and anarthria  Rationale for Evaluation and Treatment: Rehabilitation  SUBJECTIVE:   SUBJECTIVE STATEMENT: Probably 6-8 practicing sessions since last visit. Reports a mix of online and workbook activities.  Pt accompanied by: significant other Bea  PERTINENT HISTORY: recent back surgery (L3-5) on 12/31/23, Parkinson's disease causing autonomic dysregulation and orthostatic hypotension, leading to blood pressure fluctuations and dizziness upon standing. Lumbar radiculopathy (Chronic), PVD, HLD, spinal stenosis, BPH   Post-surgical recovery complicated by inappropriate medication management and inadequate rehabilitation, with delirium post-anesthesia likely exacerbated by Parkinson's disease.     PAIN:  Are you having pain? No   PATIENT GOALS: To remember what was  said                                                                                                                           TREATMENT DATE:  06/07/24:  Targeted volume and intelligibility using Speak Out! Lesson 7. Pt required frequent min A verbal cues, modeling, for volume and breath support.   Pt averages the following volume levels:  Sustained AH: 88 dB  Counting: 80 dB  Reading (phrases): 80 dB  Cognitive Exercise: 75 dB Required frequent min A verbal cues, modeling for carryover of intent /volume answering simple questions following structured practice.    05/24/24: Target improving vocal quality and increasing intensity through progressively difficulty speech tasks using Speak Out! program, Lesson October week 3 - he averaged 75dB on reading halloween sentences - modified conversation task to divergent naming costumes - he averaged 74dB with rare min A - he required usual min to mod semantic cues to name 18 costumes. Reading 3 pages of his personally relevant phrases with average of 74dB and rare min A - occasional extended time to read phrase correctly.   05/08/24: Target improving vocal quality and increasing intensity through progressively difficulty speech tasks using Speak Out! program, lesson [place] from [employer] online book. ST leads pt through exercises providing usual model prior to pt execution. occasional mod-A required to achieve target dB this date. Averages this date:  Resonant Vowels: 86 dB  Sustained ah 86 dB -- cues needed to avoid volume decay; unable to mitigate the volume decay with high to low glide  Counting: 81 dB  Reading 76 dB (visual challenges appeared to be a barrier)  Cognitive speech task 78 dB -- no cues needed Conversational sample of approx 10 minutes, pt averages 71 dB with occasional min-A.  05/02/24: Garrel enters with sub WNL volume. Targeted memory of SPEAK OUT! Exercises - with usual mod cues to recall exercises and to read  instructions and required reps. He required initial modeling to use intent with Ah to average 88dB, 84dB on glides, 80dB on counting . Generated 6 more personally relevant sentences - Targeted reading and speaking with intent reading 4 words and explaining which does not belong and why - reading averaged 74dB, explanation averaged 70dB with usual min to mod verbal  cues and modeling. Homework continues to be to complete an online home practice session. In   04/26/24:  Garrel enters with SPEAK OUT! Booklet - He has completed lessons consistently since receiving the booklet. Today, targeted intelligibility and volume using e Library A Year of Intent - October 1st week. Garrel required consistent modeling and cueing to complete each exercise correctly and with the most intent. Sustained Ah - 85dB, counting 82dB. Reading - 72dB, conversation exercises - 68dB - Garrel required frequent mod semantic cues and phrase completion cues to name 5 items in given categories and frequent mod verbal cues and modeling to use intent when cognitive load increases.   03/1724: Garrel received his SPEAK OUT! Booklet. They have watched the What is Parkinson's video and will have their children watch it as well.  Targeted volume and intelligibility using Speak Out! Lesson 2. Pt required initial usual mod verbal cues, modeling, for volume and breath support, fading to occasional min A.  Pt averages the following volume levels:  Sustained AH: 84dB  Counting:80dB  Reading (phrases): 75dB  Cognitive Exercise: 72dB with frequent mod A Required  verbal cues, modeling for carryover of intent /volume answering simple questions following structured practice. Generated list of personally relevant phrases to practice with intent, including name, number DOB, address, restaurant orders and script explaining that he has PD and may have trouble speaking- See Patient instructions   04/04/24: Evaluation completed. Introduced [employer] - ordered [employer]HE[employer] OUT! Booklet and [employer]. Demonstrated navigating website to locate What is Parkinsons video and home practice sessions. Initiated SPEAK OUT! Lesson 1 to  target volume, intelligibility and speaking with intent as well as cognitive communication exercises. With usual min to mod A, [name] averaged 79dB on sustained Ah, 76dB on glides, 78dB counting and 75dB reading. In conversation/cognitive exercise, with verbal cues and modeling, he averaged 71dB. Instructed them to complete Lesson one again in [employer], as well at to complete home practice sessions on website.    PATIENT EDUCATION: Education details: HEP for dysarthria, swallow precautions, compensations for slow processing and following conversation Person educated: Patient and Spouse Education method: Programmer, Multimedia, Facilities Manager, Verbal cues, and Handouts Education comprehension: verbal cues required and needs further education  HOME EXERCISE PROGRAM: SPEAK OUT!   GOALS: Goals reviewed with patient? Yes  SHORT TERM GOALS: Target date: 05/29/24 (extended due to scheduling)  Pt will complete HEP for dysarthria with occasional min A over 2 sessions Baseline: Goal status: ONGOING  2.  Pt will average 74dB 18/20 sentences Baseline:  Goal status: ONGOING  3.  Pt will follow swallow precautions with occasional min A Baseline:  Goal status: ONGOING  4.  Pt and spouse will carryover 2 compensatory strategies to support slow processing  Baseline:  Goal status: ONGOING  5.  Pt will average 72dB over 8 minute conversation Baseline:  Goal status: ONGOING   LONG TERM GOALS: Target date: 06/27/24  Pt will complete HEP with rare min A over 2 sessions Baseline:  Goal status: ONGOING  2.  Pt will complete 3 online home practice sessions Baseline:  Goal status: ONGOING  3.  Pt will access E Library and complete 3 lessons over 2 weeks with min A from spouse Baseline:  Goal status: ONGOING  4.  Pt  will report no episodes of globus sensation with pills Baseline:  Goal status: ONGOING  5.  Pt will average 72dB over 15 minute conversation with rare min A Baseline:  Goal status: ONGOING  6.  Pt and spouse will carry over 4 compensatory strategies to support slow processing in conversation and directions Baseline:  Goal status:ONGOING  ASSESSMENT:  CLINICAL IMPRESSION: Patient is a 73 y.o. 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 They report that Garrel freezes when speaking, has episodes of dysfluency and looses the topic.Bea reports slow follow through  and difficulty following more than one direction at a time. Garrel endorses difficulty maintaining his train of thought and following conversations. He is using a calendar to recall appointments, he is using the microwave and coffee maker independently. Bea reports increased difficulty using their remote control system and that she tells him to put out the dog, and he forgets to let the dog back in. Overall volume low affecting intelligibility. Garrel passed Yale swallow test, however he reports pills get stuck and he is having more difficulty swallowing solids. Will monitor and order MBSS if needed. I recommend skilled ST to maximize intelligibility, safety of swallow and cognition for safety, to reduce caregiver burden and improve participation in conversations for QOL. .   OBJECTIVE IMPAIRMENTS: Objective impairments include attention, memory, awareness, executive functioning, dysarthria, and dysphagia. These impairments are limiting patient from managing medications, managing appointments, managing finances, household responsibilities, ADLs/IADLs, effectively communicating at home and in community, and safety when swallowing.Factors affecting potential to achieve goals and functional outcome are ability to learn/carryover information and medical prognosis..  Patient will benefit from skilled SLP services to address above impairments and improve overall function.  REHAB POTENTIAL: Good  PLAN:  SLP FREQUENCY: 1-2x/week  SLP DURATION: 12 weeks  PLANNED INTERVENTIONS: Aspiration precaution training, Pharyngeal strengthening exercises, Diet toleration management , Language facilitation, Environmental controls, Trials of upgraded texture/liquids, Cueing hierachy, Cognitive reorganization, Internal/external aids, Functional tasks, Multimodal communication approach, SLP instruction and feedback, Compensatory strategies, Patient/family education, [phone] Treatment of speech (30 or 45 min) , [id number] Treatment of swallowing function, [phone]- Speech [address] Artic, Phon, Eval Compre, Express, and MBSS    [employer]ell [employer], CF-SLP 06/07/2024, 4:36 PM

## 2024-06-08 ENCOUNTER — Other Ambulatory Visit: Payer: Self-pay

## 2024-06-09 ENCOUNTER — Other Ambulatory Visit (HOSPITAL_COMMUNITY): Payer: Self-pay

## 2024-06-13 ENCOUNTER — Other Ambulatory Visit: Payer: Self-pay

## 2024-06-14 ENCOUNTER — Other Ambulatory Visit (HOSPITAL_COMMUNITY): Payer: Self-pay

## 2024-06-20 ENCOUNTER — Other Ambulatory Visit: Payer: Self-pay

## 2024-06-21 ENCOUNTER — Encounter: Payer: Self-pay | Admitting: Speech Pathology

## 2024-06-21 ENCOUNTER — Ambulatory Visit: Admitting: Speech Pathology

## 2024-06-21 ENCOUNTER — Other Ambulatory Visit: Payer: Self-pay

## 2024-06-21 DIAGNOSIS — R41841 Cognitive communication deficit: Secondary | ICD-10-CM

## 2024-06-21 DIAGNOSIS — R471 Dysarthria and anarthria: Secondary | ICD-10-CM | POA: Diagnosis not present

## 2024-06-21 NOTE — Therapy (Signed)
 OUTPATIENT SPEECH LANGUAGE PATHOLOGY PARKINSON'S TREATMENT   Patient Name: [name] MRN: [id number] DOB:1950/09/27, 73 y.o., male Today's Date: 06/21/2024  PCP: [name], MD REFERRING PROVIDER: [name], NP  END OF SESSION:  End of Session - 06/21/24 1143     Visit Number 9    Number of Visits 13    Date for Recertification  06/27/24    Authorization Type [facility] medicare 13 visits through 07/18/24    Authorization - Visit Number 9    Authorization - Number of Visits 13    SLP Start Time 1100    SLP Stop Time  1145    SLP Time Calculation (min) 45 min    Activity Tolerance Patient tolerated treatment well           Past Medical History:  Diagnosis Date   Allergy    seasonal   Anxiety    Arthritis    BPH (benign prostatic hyperplasia)    Complication of anesthesia    Pt. stated he had a reaction that ended in him requiring urinary cath placement; hx delirium worsening parkinson symptoms   Depression    Diverticulosis    GERD (gastroesophageal reflux disease)    esophageal spasms   Glaucoma    Gout    Head injury, closed, with concussion    Hepatitis C    chronic - Has been treated with Harvoni   HLD (hyperlipidemia)    statin intolerant (Crestor  & Simvastatin ) - Taking Livalo  1mg  / week   Hypertension    Neuromuscular disorder (HCC)    Parkinson's Disease   Parkinson's disease (HCC)    Plantar fasciitis    right   PVD (peripheral vascular disease)    With no claudication; only mild abdominal aortic atherosclerosis noted on ultrasound.   Sleep apnea    Wears CPAP nightly   Spinal stenosis of lumbar region    Thoracic ascending aortic aneurysm    4.2 cm ascending TAA 09/2016 CT, 1 yr f/u rec   Ulcer    Past Surgical History:  Procedure Laterality Date   ANTERIOR LAT LUMBAR FUSION N/A 12/31/2023   Procedure: Prone trans-psoas interbody fusion - Lumbar three-Lumbar four - Lumbar four-Lumbar five, right facetectomy Lumbar four-five;  Surgeon:  [name], MD;  Location: [facility];  Service: Neurosurgery;  Laterality: N/A;   BUNIONECTOMY WITH WEIL OSTEOTOMY Right 11/02/2019   Procedure: Right Foot Lapidus, Modified McBride Bunionectomy,  Hallux Akin Osteotomy;  Surgeon: [name], MD;  Location: [facility];  Service: Orthopedics;  Laterality: Right;   CARDIAC CATHETERIZATION  2005   30% Cx. Dr. Lavon   cataract surgery Left 11/05/2014   COLONOSCOPY     COLONOSCOPY N/A 01/09/2021   Procedure: COLONOSCOPY;  Surgeon: [name], MD;  Location: [facility];  Service: Gastroenterology;  Laterality: N/A;   ESOPHAGOGASTRODUODENOSCOPY N/A 05/02/2015   Procedure: ESOPHAGOGASTRODUODENOSCOPY (EGD);  Surgeon: [name], MD;  Location: [facility];  Service: Endoscopy;  Laterality: N/A;   ESOPHAGOGASTRODUODENOSCOPY  05/02/2015   no source of pt chest pain endoscopically evident. small hiatal hernia.   ESOPHAGOGASTRODUODENOSCOPY (EGD) WITH PROPOFOL  N/A 01/02/2021   Procedure: ESOPHAGOGASTRODUODENOSCOPY (EGD) WITH PROPOFOL ;  Surgeon: [name], MD;  Location: [facility];  Service: Endoscopy;  Laterality: N/A;   HARDWARE REMOVAL Right 11/02/2019   Procedure: Second Metatarsal Removal of Deep Implant and Rotational Osteotomy;  Surgeon: [name], MD;  Location: [facility];  Service: Orthopedics;  Laterality: Right;   IR ANGIOGRAM SELECTIVE EACH ADDITIONAL  VESSEL  01/04/2021   IR ANGIOGRAM SELECTIVE EACH ADDITIONAL VESSEL  01/04/2021   IR ANGIOGRAM VISCERAL SELECTIVE  01/04/2021   IR US  GUIDE VASC ACCESS RIGHT  01/04/2021   LUMBAR LAMINECTOMY/DECOMPRESSION MICRODISCECTOMY N/A 07/03/2021   Procedure: Lumbar three through five decompression with lumbar three through five insitu fusion;  Surgeon: [name], MD;  Location: [facility] OR;  Service: Orthopedics;  Laterality: N/A;   LUMBAR PERCUTANEOUS PEDICLE SCREW 2 LEVEL N/A 12/31/2023   Procedure: LUMBAR PERCUTANEOUS PEDICLE SCREW LUMBAR THREE-LUMBAR FIVE;   Surgeon: [name], MD;  Location: [facility];  Service: Neurosurgery;  Laterality: N/A;   MEMBRANE PEEL Left 03/14/2014   Procedure: MEMBRANE PEEL; ENDOLASER;  Surgeon: [name], MD;  Location: [facility];  Service: Ophthalmology;  Laterality: Left;   NM MYOVIEW  LTD  10/2015   LOW RISK. Small, fixed basal lateral defect - likely diaphragmatic attenuation. EF 69%   PARS PLANA VITRECTOMY Left 03/14/2014   Procedure: PARS PLANA VITRECTOMY WITH 25 GAUGE;  Surgeon: [name], MD;  Location: [facility] OR;  Service: Ophthalmology;  Laterality: Left;   shave  02/03/2022   angiofibroma   TONSILLECTOMY     TRANSTHORACIC ECHOCARDIOGRAM  01/07/2021   Normal EF 60 to 65%.  No R WMA.  GRII DD-moderately dilated LA..  Mildly dilated RV but normal function.  Normal RAP/CVP.  Trivial AI with mild to moderate sclerosis-no stenosis   UMBILICAL HERNIA REPAIR  2023   UPPER GASTROINTESTINAL ENDOSCOPY     WEIL OSTEOTOMY Right 09/01/2017   Procedure: RIGHT GREAT TOE CHEVRON AND WEIL OSTEOTOMY 2ND METATARSAL;  Surgeon: [name], MD;  Location: [facility];  Service: Orthopedics;  Laterality: Right;   XI ROBOTIC ASSISTED VENTRAL HERNIA N/A 03/17/2022   Procedure: XI ROBOTIC ASSISTED VENTRAL HERNIA;  Surgeon: [name], MD;  Location: [facility];  Service: General;  Laterality: N/A;   Patient Active Problem List   Diagnosis Date Noted   Chronic hepatitis C (HCC) 03/09/2024   Malnutrition of moderate degree 01/06/2024   Spondylolisthesis at L4-L5 level 12/31/2023   Supine hypertension 12/03/2023   Parkinson's disease with neurogenic orthostatic hypotension (HCC) 12/03/2023   Preop cardiovascular exam 12/03/2023   Glaucoma 11/30/2023   Retinal vein thrombosis (HCC) 11/30/2023   Acquired hallux rigidus of right foot 11/23/2022   Ventral hernia without obstruction or gangrene    Primary open angle glaucoma of left eye, mild stage 06/23/2021   Facial numbness 12/24/2020   Neovascular glaucoma, right eye, stage  unspecified 12/16/2020   Lumbar radiculopathy 11/25/2020   Gastroesophageal reflux disease 09/10/2020   Dry eyes, bilateral 08/15/2020   Presbycusis of right ear 01/17/2020   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) 12/12/2019   Secondary corneal edema of right eye 12/12/2019   Ptosis of right eyelid 12/12/2019   OAB (overactive bladder) 11/29/2019   Bunion of great toe of right foot 07/14/2017   Claw toe, acquired, right 07/14/2017   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   Medication management 10/09/2016   Plantar fasciitis of right foot 02/19/2015   Depression 08/14/2014   Family history of heart disease in male family member before age 60 04/26/2014   Mild cognitive impairment 03/13/2014   Parkinsonian tremor (HCC) 02/12/2014   Hyperlipidemia with target LDL less than 100    Abdominal aortic atherosclerosis    Moderate essential hypertension 02/25/2011   Arthropathy 02/25/2011   HAV (hallux abducto valgus) 02/25/2011    ONSET DATE: 02/08/24 (  referral date)  REFERRING DIAG: G20.A2 (ICD-10-CM) - Parkinson's disease without dyskinesia, with fluctuating manifestations (HCC)  THERAPY DIAG:  Dysarthria and anarthria  Cognitive communication deficit  Rationale for Evaluation and Treatment: Rehabilitation  SUBJECTIVE:   SUBJECTIVE STATEMENT: Probably 6-8 practicing sessions since last visit. Reports a mix of online and workbook activities.  Pt accompanied by: significant other Bea  PERTINENT HISTORY: recent back surgery (L3-5) on 12/31/23, Parkinson's disease causing autonomic dysregulation and orthostatic hypotension, leading to blood pressure fluctuations and dizziness upon standing. Lumbar radiculopathy (Chronic), PVD, HLD, spinal stenosis, BPH   Post-surgical recovery complicated by inappropriate medication management and inadequate rehabilitation, with delirium post-anesthesia likely exacerbated by  Parkinson's disease.     PAIN:  Are you having pain? No   PATIENT GOALS: To remember what was said                                                                                                                           TREATMENT DATE:   06/21/24: Garrel reports completing HEP not as much as I should He has had inconsistent schedule of ST sessions - will add 4 more visits to max carryover of HEP and intelligible speech outside of ST.  Targeted volume and intelligibility using Speak Out! Lesson November week 1 from [employer]. Pt required occasional lmin verbal cues, modeling, for volume and breath support.  Pt averages the following volume levels:  Sustained AH: 88dB  Counting:83dB  Reading (phrases): 84dB  Cognitive Exercise: 74dB with frequent mod A Required usual min to mod verbal cues, modeling for carryover of intent /volume in spontaneous conversation. Targeted generating a plan for carryover of HEP consistently   06/07/24:  Targeted volume and intelligibility using Speak Out! Lesson 7. Pt required frequent min A verbal cues, modeling, for volume and breath support.   Pt averages the following volume levels:  Sustained AH: 88 dB  Counting: 80 dB  Reading (phrases): 80 dB  Cognitive Exercise: 75 dB Required frequent min A verbal cues, modeling for carryover of intent /volume answering simple questions following structured practice.    05/24/24: Target improving vocal quality and increasing intensity through progressively difficulty speech tasks using Speak Out! program, Lesson October week 3 - he averaged 75dB on reading halloween sentences - modified conversation task to divergent naming costumes - he averaged 74dB with rare min A - he required usual min to mod semantic cues to name 18 costumes. Reading 3 pages of his personally relevant phrases with average of 74dB and rare min A - occasional extended time to read phrase correctly.   05/08/24: Target improving vocal quality  and increasing intensity through progressively difficulty speech tasks using Speak Out! program, lesson [place] from [employer] online book. ST leads pt through exercises providing usual model prior to pt execution. occasional mod-A required to achieve target dB this date. Averages this date:  Resonant Vowels: 86 dB  Sustained ah 86 dB -- cues needed to  avoid volume decay; unable to mitigate the volume decay with high to low glide  Counting: 81 dB  Reading 76 dB (visual challenges appeared to be a barrier)  Cognitive speech task 78 dB -- no cues needed Conversational sample of approx 10 minutes, pt averages 71 dB with occasional min-A.  05/02/24: Garrel enters with sub WNL volume. Targeted memory of SPEAK OUT! Exercises - with usual mod cues to recall exercises and to read instructions and required reps. He required initial modeling to use intent with Ah to average 88dB, 84dB on glides, 80dB on counting . Generated 6 more personally relevant sentences - Targeted reading and speaking with intent reading 4 words and explaining which does not belong and why - reading averaged 74dB, explanation averaged 70dB with usual min to mod verbal cues and modeling. Homework continues to be to complete an online home practice session. In   04/26/24:  Garrel enters with SPEAK OUT! Booklet - He has completed lessons consistently since receiving the booklet. Today, targeted intelligibility and volume using e Library A Year of Intent - October 1st week. Garrel required consistent modeling and cueing to complete each exercise correctly and with the most intent. Sustained Ah - 85dB, counting 82dB. Reading - 72dB, conversation exercises - 68dB - Garrel required frequent mod semantic cues and phrase completion cues to name 5 items in given categories and frequent mod verbal cues and modeling to use intent when cognitive load increases.   03/1724: Garrel received his SPEAK OUT! Booklet. They have watched the What is  Parkinson's video and will have their children watch it as well.  Targeted volume and intelligibility using Speak Out! Lesson 2. Pt required initial usual mod verbal cues, modeling, for volume and breath support, fading to occasional min A.  Pt averages the following volume levels:  Sustained AH: 84dB  Counting:80dB  Reading (phrases): 75dB  Cognitive Exercise: 72dB with frequent mod A Required  verbal cues, modeling for carryover of intent /volume answering simple questions following structured practice. Generated list of personally relevant phrases to practice with intent, including name, number DOB, address, restaurant orders and script explaining that he has PD and may have trouble speaking- See Patient instructions   04/04/24: Evaluation completed. Introduced [employer] - ordered ANHEUSER-[employer]CH OUT! Booklet and [employer]. Demonstrated navigating website to locate What is Parkinsons video and home practice sessions. Initiated SPEAK OUT! Lesson 1 to  target volume, intelligibility and speaking with intent as well as cognitive communication exercises. With usual min to mod A, [name] averaged 79dB on sustained Ah, 76dB on glides, 78dB counting and 75dB reading. In conversation/cognitive exercise, with verbal cues and modeling, he averaged 71dB. Instructed them to complete Lesson one again in [employer], as well at to complete home practice sessions on website.    PATIENT EDUCATION: Education details: HEP for dysarthria, swallow precautions, compensations for slow processing and following conversation Person educated: Patient and Spouse Education method: Programmer, Multimedia, Facilities Manager, Verbal cues, and Handouts Education comprehension: verbal cues required and needs further education  HOME EXERCISE PROGRAM: SPEAK OUT!   GOALS: Goals reviewed with patient? Yes  SHORT TERM GOALS: Target date: 05/29/24 (extended due to scheduling)  Pt will complete HEP for dysarthria with occasional  min A over 2 sessions Baseline: Goal status: NOT MET  2.  Pt will average 74dB 18/20 sentences Baseline:  Goal status: MET  3.  Pt will follow swallow precautions with occasional min A Baseline:  Goal status: MET  4.  Pt and spouse will carryover 2 compensatory strategies to support slow processing  Baseline:  Goal status: MET  5.  Pt will average 72dB over 8 minute conversation Baseline:  Goal status: MET   LONG TERM GOALS: Target date: 06/27/24  Pt will complete HEP with rare min A over 2 sessions Baseline:  Goal status: ONGOING  2.  Pt will complete 3 online home practice sessions Baseline:  Goal status: ONGOING  3.  Pt will access E Library and complete 3 lessons over 2 weeks with min A from spouse Baseline:  Goal status: ONGOING  4.  Pt will report no episodes of globus sensation with pills Baseline:  Goal status: ONGOING  5.  Pt will average 72dB over 15 minute conversation with rare min A Baseline:  Goal status: ONGOING  6.  Pt and spouse will carry over 4 compensatory strategies to support slow processing in conversation and directions Baseline:  Goal status:ONGOING  ASSESSMENT:  CLINICAL IMPRESSION: Patient is a 73 y.o. 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 He is completing HEP with rare to occasional min A to achieve target dB and maintain clear, intelligible speech. In conversation, he continues to require frequent min to mod A to carryover intelligible speech into conversation. Spouse reports she continues to have to cue Garrel to speak with intent. Garrel requires ongoing ST to maximize carryover of HEP and to carryover volume and clear phonation outside of ST. I recommend skilled ST to maximize intelligibility, safety of swallow and cognition for safety, to reduce caregiver burden and improve participation in conversations for QOL. .   OBJECTIVE IMPAIRMENTS: Objective  impairments include attention, memory, awareness, executive functioning, dysarthria, and dysphagia. These impairments are limiting patient from managing medications, managing appointments, managing finances, household responsibilities, ADLs/IADLs, effectively communicating at home and in community, and safety when swallowing.Factors affecting potential to achieve goals and functional outcome are ability to learn/carryover information and medical prognosis.. Patient will benefit from skilled SLP services to address above impairments and improve overall function.  REHAB POTENTIAL: Good  PLAN:  SLP FREQUENCY: 1-2x/week  SLP DURATION: 12 weeks  PLANNED INTERVENTIONS: Aspiration precaution training, Pharyngeal strengthening exercises, Diet toleration management , Language facilitation, Environmental controls, Trials of upgraded texture/liquids, Cueing hierachy, Cognitive reorganization, Internal/external aids, Functional tasks, Multimodal communication approach, SLP instruction and feedback, Compensatory strategies, Patient/family education, [phone] Treatment of speech (30 or 45 min) , [id number] Treatment of swallowing function, [phone]- Speech [address] Artic, Phon, Eval Compre, Express, and MBSS    Waddell Music, CF-SLP 06/21/2024, 11:49 AM

## 2024-06-26 NOTE — Progress Notes (Deleted)
 Cardiology Office Note:    Date:  06/26/2024   ID:  [name], [name] 11-25-1950, MRN [id number]  PCP:  Jesus Norleen BROCKS, MD  Cardiologist:  Alm Clay, MD { Click to update primary MD,subspecialty MD or APP then REFRESH:1}    Referring MD: Jesus Norleen BROCKS, MD   Chief Complaint: follow-up of hypertension and orthostatic hypotension  History of Present Illness:    [name] is a 73 y.o. male with a history of thoracic aortic aneurysm noted on CT in 2018, hypertension complicated by orthostatic hypotension, hyperlipidemia, GERD, lower GI bleed in 12/2020,  hepatitis C, obstructive sleep apnea, BPH, Parkinson's disease, and anxiety/ depression who is followed by Dr. Clay and presents today for routine follow-up.   Patient has primarily been followed by Cardiology for hypertension. Remote cardiac catheterization in 12/2003 showed trivial disease in the LCX but essentially normal coronaries. He has had multiple stress tests since then. Last Myoview  in 2017 was low risk with no evidence of ischemia. Last Echo in 12/2020 showed LVEF of 60-65% with normal wall motion and grade 2 diastolic dysfunction, normal RV function, and mild to moderate aortic valve sclerosis but no aortic stenosis. Carotid dopplers in 12/2020 showed minimal (1-39%) stenosis of bilateral ICAs and disturbed flow in the right subclavian artery. ABIs in 01/2023 were normal.   He was last seen by Dr. Clay in 01/2024 at which time he reported labile BP with associated dizziness and near syncope with position changes since admission in 12/2023 following lumbar spine fusion. BP was 97/61 in the office. Therefore, Irbesartan  was stopped and plan was to allow for some permissive hypertension given orthostasis. He was subsequently started on Midodrine   Patient presents today for follow-up. ***  Hypertension Orthostatic Hypotension Patient has a history of hypertension complicated by orthostatic hypotension likely secondary to  Parkinson's disease causing autonomic dysfunction.  - *** - Continue Midodrine  2.5mg  daily.  - Continue conservative measures: staying adequately hydrated and wearing compression stockings.  Hyperlipidemia LDL 72 in 12/2022.  - Continue Lipitor 20mg  daily.  Thoracic Aortic Aneurysm Chest CT in 09/2016 showed a 4.2cm ascending thoracic aortic aneurysms.  - Will repeat chest CTA. ***  EKGs/Labs/Other Studies Reviewed:    The following studies were reviewed:  Myoview  11/22/2015: Nuclear stress EF: 69%. Blood pressure demonstrated a hypertensive response to exercise. Upsloping ST segment depression ST segment depression of 1 mm was noted during stress in the V5, V6, aVF, III and II leads, and returning to baseline after less than 1 minute of recovery. Defect 1: There is a small defect of mild severity present in the basal anterolateral location. The study is normal. This is a low risk study.   Low risk stress nuclear study with a small fixed basal lateral defect, likely representing artifact. Otherwise normal perfusion and normal left ventricular regional and global systolic function. _______________  Echocardiogram 01/07/2021: Impressions: 1. Left ventricular ejection fraction, by estimation, is 60 to 65%. The  left ventricle has normal function. The left ventricle has no regional  wall motion abnormalities. Left ventricular diastolic parameters are  consistent with Grade II diastolic  dysfunction (pseudonormalization).   2. Right ventricular systolic function is normal. The right ventricular  size is mildly enlarged. Tricuspid regurgitation signal is inadequate for  assessing PA pressure.   3. Left atrial size was moderately dilated.   4. The mitral valve is normal in structure. No evidence of mitral valve  regurgitation. No evidence of mitral stenosis.   5. The  aortic valve is tricuspid. Aortic valve regurgitation is trivial.  Mild to moderate aortic valve  sclerosis/calcification is present, without  any evidence of aortic stenosis.   6. The inferior vena cava is normal in size with greater than 50%  respiratory variability, suggesting right atrial pressure of 3 mmHg.    EKG:  EKG not ordered today.  Recent Labs: 12/16/2023: ALT 13; BUN 24; Creatinine, Ser 1.10; Potassium 4.0; Sodium 136 01/01/2024: Hemoglobin 10.9; Platelets 174  Recent Lipid Panel    Component Value Date/Time   CHOL 136 01/19/2023 0919   TRIG 37 01/19/2023 0919   HDL 55 01/19/2023 0919   CHOLHDL 2.5 01/19/2023 0919   CHOLHDL 2.2 01/07/2021 0245   VLDL 7 01/07/2021 0245   LDLCALC 72 01/19/2023 0919    Physical Exam:    Vital Signs: There were no vitals taken for this visit.    Wt Readings from Last 3 Encounters:  04/12/24 147 lb (66.7 kg)  03/09/24 150 lb (68 kg)  01/26/24 152 lb 9.6 oz (69.2 kg)     General: 73 y.o. male in no acute distress. HEENT: Normocephalic and atraumatic. Sclera clear.  Neck: Supple. No carotid bruits. No JVD. Heart: *** RRR. Distinct S1 and S2. No murmurs, gallops, or rubs.  Lungs: No increased work of breathing. Clear to ausculation bilaterally. No wheezes, rhonchi, or rales.  Abdomen: Soft, non-distended, and non-tender to palpation.  Extremities: No lower extremity edema.  Radial and distal pedal pulses 2+ and equal bilaterally. Skin: Warm and dry. Neuro: No focal deficits. Psych: Normal affect. Responds appropriately.   Assessment:    No diagnosis found.  Plan:     Disposition: Follow up in ***   Signed, Aline FORBES Door, PA-C  06/26/2024 11:04 AM    [facility]

## 2024-06-27 ENCOUNTER — Ambulatory Visit: Attending: Adult Health

## 2024-06-27 DIAGNOSIS — R471 Dysarthria and anarthria: Secondary | ICD-10-CM | POA: Insufficient documentation

## 2024-06-27 DIAGNOSIS — R41841 Cognitive communication deficit: Secondary | ICD-10-CM | POA: Diagnosis present

## 2024-06-27 NOTE — Therapy (Signed)
 OUTPATIENT SPEECH LANGUAGE PATHOLOGY PARKINSON'S TREATMENT/RECERTIFICATION   Patient Name: [name] MRN: [id number] DOB:Mar 25, 1951, 73 y.o., male Today's Date: 06/27/2024  PCP: [name], MD REFERRING PROVIDER: [name], NP  END OF SESSION:  End of Session - 06/27/24 1644     Visit Number 10    Number of Visits 13    Date for Recertification  06/27/24    SLP Start Time 1405    SLP Stop Time  1445    SLP Time Calculation (min) 40 min    Activity Tolerance Patient tolerated treatment well            Past Medical History:  Diagnosis Date   Allergy    seasonal   Anxiety    Arthritis    BPH (benign prostatic hyperplasia)    Complication of anesthesia    Pt. stated he had a reaction that ended in him requiring urinary cath placement; hx delirium worsening parkinson symptoms   Depression    Diverticulosis    GERD (gastroesophageal reflux disease)    esophageal spasms   Glaucoma    Gout    Head injury, closed, with concussion    Hepatitis C    chronic - Has been treated with Harvoni   HLD (hyperlipidemia)    statin intolerant (Crestor  & Simvastatin ) - Taking Livalo  1mg  / week   Hypertension    Neuromuscular disorder (HCC)    Parkinson's Disease   Parkinson's disease (HCC)    Plantar fasciitis    right   PVD (peripheral vascular disease)    With no claudication; only mild abdominal aortic atherosclerosis noted on ultrasound.   Sleep apnea    Wears CPAP nightly   Spinal stenosis of lumbar region    Thoracic ascending aortic aneurysm    4.2 cm ascending TAA 09/2016 CT, 1 yr f/u rec   Ulcer    Past Surgical History:  Procedure Laterality Date   ANTERIOR LAT LUMBAR FUSION N/A 12/31/2023   Procedure: Prone trans-psoas interbody fusion - Lumbar three-Lumbar four - Lumbar four-Lumbar five, right facetectomy Lumbar four-five;  Surgeon: [name], MD;  Location: [facility];  Service: Neurosurgery;  Laterality: N/A;   BUNIONECTOMY WITH WEIL OSTEOTOMY  Right 11/02/2019   Procedure: Right Foot Lapidus, Modified McBride Bunionectomy,  Hallux Akin Osteotomy;  Surgeon: [name], MD;  Location: [facility];  Service: Orthopedics;  Laterality: Right;   CARDIAC CATHETERIZATION  2005   30% Cx. Dr. Lavon   cataract surgery Left 11/05/2014   COLONOSCOPY     COLONOSCOPY N/A 01/09/2021   Procedure: COLONOSCOPY;  Surgeon: [name], MD;  Location: [facility];  Service: Gastroenterology;  Laterality: N/A;   ESOPHAGOGASTRODUODENOSCOPY N/A 05/02/2015   Procedure: ESOPHAGOGASTRODUODENOSCOPY (EGD);  Surgeon: [name], MD;  Location: [facility];  Service: Endoscopy;  Laterality: N/A;   ESOPHAGOGASTRODUODENOSCOPY  05/02/2015   no source of pt chest pain endoscopically evident. small hiatal hernia.   ESOPHAGOGASTRODUODENOSCOPY (EGD) WITH PROPOFOL  N/A 01/02/2021   Procedure: ESOPHAGOGASTRODUODENOSCOPY (EGD) WITH PROPOFOL ;  Surgeon: [name], MD;  Location: [facility];  Service: Endoscopy;  Laterality: N/A;   HARDWARE REMOVAL Right 11/02/2019   Procedure: Second Metatarsal Removal of Deep Implant and Rotational Osteotomy;  Surgeon: [name], MD;  Location: [facility];  Service: Orthopedics;  Laterality: Right;   IR ANGIOGRAM SELECTIVE EACH ADDITIONAL VESSEL  01/04/2021   IR ANGIOGRAM SELECTIVE EACH ADDITIONAL VESSEL  01/04/2021   IR ANGIOGRAM VISCERAL SELECTIVE  01/04/2021   IR US  GUIDE VASC  ACCESS RIGHT  01/04/2021   LUMBAR LAMINECTOMY/DECOMPRESSION MICRODISCECTOMY N/A 07/03/2021   Procedure: Lumbar three through five decompression with lumbar three through five insitu fusion;  Surgeon: [name], MD;  Location: [facility] OR;  Service: Orthopedics;  Laterality: N/A;   LUMBAR PERCUTANEOUS PEDICLE SCREW 2 LEVEL N/A 12/31/2023   Procedure: LUMBAR PERCUTANEOUS PEDICLE SCREW LUMBAR THREE-LUMBAR FIVE;  Surgeon: [name], MD;  Location: [facility];  Service: Neurosurgery;  Laterality: N/A;   MEMBRANE PEEL Left  03/14/2014   Procedure: MEMBRANE PEEL; ENDOLASER;  Surgeon: [name], MD;  Location: [facility];  Service: Ophthalmology;  Laterality: Left;   NM MYOVIEW  LTD  10/2015   LOW RISK. Small, fixed basal lateral defect - likely diaphragmatic attenuation. EF 69%   PARS PLANA VITRECTOMY Left 03/14/2014   Procedure: PARS PLANA VITRECTOMY WITH 25 GAUGE;  Surgeon: [name], MD;  Location: [facility] OR;  Service: Ophthalmology;  Laterality: Left;   shave  02/03/2022   angiofibroma   TONSILLECTOMY     TRANSTHORACIC ECHOCARDIOGRAM  01/07/2021   Normal EF 60 to 65%.  No R WMA.  GRII DD-moderately dilated LA..  Mildly dilated RV but normal function.  Normal RAP/CVP.  Trivial AI with mild to moderate sclerosis-no stenosis   UMBILICAL HERNIA REPAIR  2023   UPPER GASTROINTESTINAL ENDOSCOPY     WEIL OSTEOTOMY Right 09/01/2017   Procedure: RIGHT GREAT TOE CHEVRON AND WEIL OSTEOTOMY 2ND METATARSAL;  Surgeon: [name], MD;  Location: [facility];  Service: Orthopedics;  Laterality: Right;   XI ROBOTIC ASSISTED VENTRAL HERNIA N/A 03/17/2022   Procedure: XI ROBOTIC ASSISTED VENTRAL HERNIA;  Surgeon: [name], MD;  Location: [facility];  Service: General;  Laterality: N/A;   Patient Active Problem List   Diagnosis Date Noted   Chronic hepatitis C (HCC) 03/09/2024   Malnutrition of moderate degree 01/06/2024   Spondylolisthesis at L4-L5 level 12/31/2023   Supine hypertension 12/03/2023   Parkinson's disease with neurogenic orthostatic hypotension (HCC) 12/03/2023   Preop cardiovascular exam 12/03/2023   Glaucoma 11/30/2023   Retinal vein thrombosis (HCC) 11/30/2023   Acquired hallux rigidus of right foot 11/23/2022   Ventral hernia without obstruction or gangrene    Primary open angle glaucoma of left eye, mild stage 06/23/2021   Facial numbness 12/24/2020   Neovascular glaucoma, right eye, stage unspecified 12/16/2020   Lumbar radiculopathy 11/25/2020   Gastroesophageal reflux disease 09/10/2020   Dry  eyes, bilateral 08/15/2020   Presbycusis of right ear 01/17/2020   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) 12/12/2019   Secondary corneal edema of right eye 12/12/2019   Ptosis of right eyelid 12/12/2019   OAB (overactive bladder) 11/29/2019   Bunion of great toe of right foot 07/14/2017   Claw toe, acquired, right 07/14/2017   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   Medication management 10/09/2016   Plantar fasciitis of right foot 02/19/2015   Depression 08/14/2014   Family history of heart disease in male family member before age 92 04/26/2014   Mild cognitive impairment 03/13/2014   Parkinsonian tremor (HCC) 02/12/2014   Hyperlipidemia with target LDL less than 100    Abdominal aortic atherosclerosis    Moderate essential hypertension 02/25/2011   Arthropathy 02/25/2011   HAV (hallux abducto valgus) 02/25/2011    ONSET DATE: 02/08/24 (referral date)  REFERRING DIAG: G20.A2 (ICD-10-CM) - Parkinson's disease without dyskinesia, with fluctuating manifestations (HCC)  THERAPY DIAG:  Dysarthria and anarthria - Plan: SLP plan  of care cert/re-cert  Rationale for Evaluation and Treatment: Rehabilitation  SUBJECTIVE:   SUBJECTIVE STATEMENT: I have been practicing sometimes. Reports a mix of online and workbook activities.  Pt accompanied by: significant other Bea  PERTINENT HISTORY: recent back surgery (L3-5) on 12/31/23, Parkinson's disease causing autonomic dysregulation and orthostatic hypotension, leading to blood pressure fluctuations and dizziness upon standing. Lumbar radiculopathy (Chronic), PVD, HLD, spinal stenosis, BPH   Post-surgical recovery complicated by inappropriate medication management and inadequate rehabilitation, with delirium post-anesthesia likely exacerbated by Parkinson's disease.     PAIN:  Are you having pain? No   PATIENT GOALS: To remember what was said                                                                                                                            TREATMENT DATE:   06/27/24: Pt reports that he is practicing sometimes since prior session. Pt states that he is realizing that he needs to practice more to improve speaking with intent.  Targeted volume and intelligibility using Speak Out! Lesson 10. Pt required frequent min verbal cues, modeling, for volume and breath support.   Pt averages the following volume levels:  Sustained AH: 86 dB  Counting: 77 dB  Reading (phrases): 76 dB  Cognitive Exercise: 73 dB Required mod to min verbal cues, modeling for carryover of intent /volume answering simple questions following structured practice. Pt is making progress with dysarthria goals. Pt is working toward improving his compliance with HEP. SLP collaborated with pt on building a HEP program for carry-over of SPEAK OUT! Strategies. Pt plans on practicing at least 20 minutes/day until upcoming session. Pt is making progress with    06/21/24: Garrel reports completing HEP not as much as I should He has had inconsistent schedule of ST sessions - will add 4 more visits to max carryover of HEP and intelligible speech outside of ST.  Targeted volume and intelligibility using Speak Out! Lesson November week 1 from [employer]. Pt required occasional lmin verbal cues, modeling, for volume and breath support.  Pt averages the following volume levels:  Sustained AH: 88dB  Counting:83dB  Reading (phrases): 84dB  Cognitive Exercise: 74dB with frequent mod A Required usual min to mod verbal cues, modeling for carryover of intent /volume in spontaneous conversation. Targeted generating a plan for carryover of HEP consistently   06/07/24:  Targeted volume and intelligibility using Speak Out! Lesson 7. Pt required frequent min A verbal cues, modeling, for volume and breath support.   Pt averages the following volume levels:  Sustained AH: 88 dB  Counting: 80  dB  Reading (phrases): 80 dB  Cognitive Exercise: 75 dB Required frequent min A verbal cues, modeling for carryover of intent /volume answering simple questions following structured practice.    05/24/24: Target improving vocal quality and increasing intensity through progressively difficulty speech tasks using Speak Out! program, Lesson October week 3 - he averaged 75dB on reading halloween sentences -  modified conversation task to divergent naming costumes - he averaged 74dB with rare min A - he required usual min to mod semantic cues to name 18 costumes. Reading 3 pages of his personally relevant phrases with average of 74dB and rare min A - occasional extended time to read phrase correctly.   05/08/24: Target improving vocal quality and increasing intensity through progressively difficulty speech tasks using Speak Out! program, lesson [place] from [employer] online book. ST leads pt through exercises providing usual model prior to pt execution. occasional mod-A required to achieve target dB this date. Averages this date:  Resonant Vowels: 86 dB  Sustained ah 86 dB -- cues needed to avoid volume decay; unable to mitigate the volume decay with high to low glide  Counting: 81 dB  Reading 76 dB (visual challenges appeared to be a barrier)  Cognitive speech task 78 dB -- no cues needed Conversational sample of approx 10 minutes, pt averages 71 dB with occasional min-A.  05/02/24: Garrel enters with sub WNL volume. Targeted memory of SPEAK OUT! Exercises - with usual mod cues to recall exercises and to read instructions and required reps. He required initial modeling to use intent with Ah to average 88dB, 84dB on glides, 80dB on counting . Generated 6 more personally relevant sentences - Targeted reading and speaking with intent reading 4 words and explaining which does not belong and why - reading averaged 74dB, explanation averaged 70dB with usual min to mod verbal cues and modeling.  Homework continues to be to complete an online home practice session. In   04/26/24:  Garrel enters with SPEAK OUT! Booklet - He has completed lessons consistently since receiving the booklet. Today, targeted intelligibility and volume using e Library A Year of Intent - October 1st week. Garrel required consistent modeling and cueing to complete each exercise correctly and with the most intent. Sustained Ah - 85dB, counting 82dB. Reading - 72dB, conversation exercises - 68dB - Garrel required frequent mod semantic cues and phrase completion cues to name 5 items in given categories and frequent mod verbal cues and modeling to use intent when cognitive load increases.   03/1724: Garrel received his SPEAK OUT! Booklet. They have watched the What is Parkinson's video and will have their children watch it as well.  Targeted volume and intelligibility using Speak Out! Lesson 2. Pt required initial usual mod verbal cues, modeling, for volume and breath support, fading to occasional min A.  Pt averages the following volume levels:  Sustained AH: 84dB  Counting:80dB  Reading (phrases): 75dB  Cognitive Exercise: 72dB with frequent mod A Required  verbal cues, modeling for carryover of intent /volume answering simple questions following structured practice. Generated list of personally relevant phrases to practice with intent, including name, number DOB, address, restaurant orders and script explaining that he has PD and may have trouble speaking- See Patient instructions   04/04/24: Evaluation completed. Introduced [employer] - ordered ANHEUSER-BUSCH OUT! Booklet and [employer]. Demonstrated navigating website to locate What is Parkinsons video and home practice sessions. Initiated SPEAK OUT! Lesson 1 to  target volume, intelligibility and speaking with intent as well as cognitive communication exercises. With usual min to mod A, [name] averaged 79dB on sustained Ah, 76dB on glides, 78dB counting and 75dB  reading. In conversation/cognitive exercise, with verbal cues and modeling, he averaged 71dB. Instructed them to complete Lesson one again in [employer], as well at to complete home practice sessions on website.  PATIENT EDUCATION: Education details: HEP for dysarthria, swallow precautions, compensations for slow processing and following conversation Person educated: Patient and Spouse Education method: Programmer, Multimedia, Facilities Manager, Verbal cues, and Handouts Education comprehension: verbal cues required and needs further education  HOME EXERCISE PROGRAM: SPEAK OUT!   GOALS: Goals reviewed with patient? Yes  SHORT TERM GOALS: Target date: 05/29/24 (extended due to scheduling)  Pt will complete HEP for dysarthria with occasional min A over 2 sessions Baseline: Goal status: MET  2.  Pt will average 74dB 18/20 sentences Baseline:  Goal status: MET  3.  Pt will follow swallow precautions with occasional min A Baseline:  Goal status: MET  4.  Pt and spouse will carryover 2 compensatory strategies to support slow processing  Baseline:  Goal status: MET  5.  Pt will average 72dB over 8 minute conversation Baseline:  Goal status: MET   LONG TERM GOALS: LTG=STGs Target date: 06/27/24  Pt will complete HEP with rare min A over 2 sessions Baseline:  Goal status: ONGOING/NOT MET  2.  Pt will complete 3 online home practice sessions Baseline:  Goal status: ONGOING/NOT MET  3.  Pt will access E Library and complete 3 lessons over 2 weeks with min A from spouse Baseline:  Goal status: ONGOING/NOT MET  4.  Pt will report no episodes of globus sensation with pills Baseline:  Goal status: ONGOING/NOT MET  5.  Pt will average 72dB over 15 minute conversation with rare min A Baseline:  Goal status: ONGOING/NOT MET  6.  Pt and spouse will carry over 4 compensatory strategies to support slow processing in conversation and directions Baseline:  Goal status:ONGOING/NOT  MET  ASSESSMENT:  CLINICAL IMPRESSION: Patient is a 73 y.o. 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 He is completing HEP with rare to occasional min A to achieve target dB and maintain clear, intelligible speech. In conversation, he continues to require frequent min to mod A to carryover intelligible speech into conversation. Spouse reports she continues to have to cue Garrel to speak with intent. Garrel requires ongoing ST to maximize carryover of HEP and to carryover volume and clear phonation outside of ST. I recommend skilled ST to maximize intelligibility, safety of swallow and cognition for safety, to reduce caregiver burden and improve participation in conversations for QOL.   OBJECTIVE IMPAIRMENTS: Objective impairments include attention, memory, awareness, executive functioning, dysarthria, and dysphagia. These impairments are limiting patient from managing medications, managing appointments, managing finances, household responsibilities, ADLs/IADLs, effectively communicating at home and in community, and safety when swallowing.Factors affecting potential to achieve goals and functional outcome are ability to learn/carryover information and medical prognosis.. Patient will benefit from skilled SLP services to address above impairments and improve overall function.  REHAB POTENTIAL: Good  PLAN:  SLP FREQUENCY: 1-2x/week  SLP DURATION: 12 weeks  PLANNED INTERVENTIONS: Aspiration precaution training, Pharyngeal strengthening exercises, Diet toleration management , Language facilitation, Environmental controls, Trials of upgraded texture/liquids, Cueing hierachy, Cognitive reorganization, Internal/external aids, Functional tasks, Multimodal communication approach, SLP instruction and feedback, Compensatory strategies, Patient/family education, [phone] Treatment of speech (30 or 45 min) , [id number] Treatment of  swallowing function, [phone]- Speech [address] Artic, Phon, Eval Compre, Express, and MBSS    Waddell Music, CF-SLP 06/27/2024, 4:47 PM

## 2024-06-28 ENCOUNTER — Other Ambulatory Visit: Payer: Self-pay

## 2024-06-28 ENCOUNTER — Other Ambulatory Visit (HOSPITAL_COMMUNITY): Payer: Self-pay

## 2024-06-28 LAB — OPHTHALMOLOGY REPORT-SCANNED

## 2024-06-28 MED ORDER — BACITRACIN-POLYMYXIN B 500-10000 UNIT/GM OP OINT
TOPICAL_OINTMENT | OPHTHALMIC | 6 refills | Status: AC
  Filled 2024-06-28: qty 3.5, 30d supply, fill #0
  Filled 2024-07-21: qty 3.5, 30d supply, fill #1
  Filled 2024-08-20: qty 3.5, 30d supply, fill #2

## 2024-07-02 ENCOUNTER — Encounter: Payer: Self-pay | Admitting: Adult Health

## 2024-07-02 NOTE — Progress Notes (Deleted)
 Cardiology Office Note:    Date:  07/02/2024   ID:  Jesus, Maxwell 07-04-1951, MRN [id number]  PCP:  Jesus Norleen BROCKS, MD  Cardiologist:  Alm Clay, MD { Click to update primary MD,subspecialty MD or APP then REFRESH:1}    Referring MD: Jesus Norleen BROCKS, MD   Chief Complaint: follow-up of hypertension and orthostatic hypotension   History of Present Illness:    [name] is a 73 y.o. male with a history of thoracic aortic aneurysm noted on CT in 2018, hypertension complicated by orthostatic hypotension, hyperlipidemia, GERD, lower GI bleed in 12/2020,  hepatitis C, obstructive sleep apnea, BPH, Parkinson's disease, and anxiety/ depression who is followed by Dr. Clay and presents today for routine follow-up.   Patient has primarily been followed by Cardiology for hypertension. Remote cardiac catheterization in 12/2003 showed trivial disease in the LCX but essentially normal coronaries. He has had multiple stress tests since then. Last Myoview  in 2017 was low risk with no evidence of ischemia. Last Echo in 12/2020 showed LVEF of 60-65% with normal wall motion and grade 2 diastolic dysfunction, normal RV function, and mild to moderate aortic valve sclerosis but no aortic stenosis. Carotid dopplers in 12/2020 showed minimal (1-39%) stenosis of bilateral ICAs and disturbed flow in the right subclavian artery. ABIs in 01/2023 were normal.   He was last seen by Dr. Clay in 01/2024 at which time he reported labile BP with associated dizziness and near syncope with position changes since admission in 12/2023 following lumbar spine fusion. BP was 97/61 in the office. Therefore, Irbesartan  was stopped and plan was to allow for some permissive hypertension given orthostasis. He was subsequently started on Midodrine   Patient presents today for follow-up. ***  Hypertension Orthostatic Hypotension Patient has a history of hypertension complicated by orthostatic hypotension likely secondary  to Parkinson's disease causing autonomic dysfunction.  - *** - Continue Midodrine  2.5mg  daily.  - Continue conservative measures: staying adequately hydrated and wearing compression stockings.  Hyperlipidemia LDL 72 in 12/2022.  - Continue Lipitor 20mg  daily.  Thoracic Aortic Aneurysm Chest CT in 09/2016 showed a 4.2cm ascending thoracic aortic aneurysms.  - Will repeat chest CTA. ***  EKGs/Labs/Other Studies Reviewed:    The following studies were reviewed:   Myoview  11/22/2015: Nuclear stress EF: 69%. Blood pressure demonstrated a hypertensive response to exercise. Upsloping ST segment depression ST segment depression of 1 mm was noted during stress in the V5, V6, aVF, III and II leads, and returning to baseline after less than 1 minute of recovery. Defect 1: There is a small defect of mild severity present in the basal anterolateral location. The study is normal. This is a low risk study.   Low risk stress nuclear study with a small fixed basal lateral defect, likely representing artifact. Otherwise normal perfusion and normal left ventricular regional and global systolic function. _______________   Echocardiogram 01/07/2021: Impressions: 1. Left ventricular ejection fraction, by estimation, is 60 to 65%. The  left ventricle has normal function. The left ventricle has no regional  wall motion abnormalities. Left ventricular diastolic parameters are  consistent with Grade II diastolic  dysfunction (pseudonormalization).   2. Right ventricular systolic function is normal. The right ventricular  size is mildly enlarged. Tricuspid regurgitation signal is inadequate for  assessing PA pressure.   3. Left atrial size was moderately dilated.   4. The mitral valve is normal in structure. No evidence of mitral valve  regurgitation. No evidence of mitral stenosis.  5. The aortic valve is tricuspid. Aortic valve regurgitation is trivial.  Mild to moderate aortic valve  sclerosis/calcification is present, without  any evidence of aortic stenosis.   6. The inferior vena cava is normal in size with greater than 50%  respiratory variability, suggesting right atrial pressure of 3 mmHg.      EKG:  EKG not ordered today.  Recent Labs: 12/16/2023: ALT 13; BUN 24; Creatinine, Ser 1.10; Potassium 4.0; Sodium 136 01/01/2024: Hemoglobin 10.9; Platelets 174  Recent Lipid Panel    Component Value Date/Time   CHOL 136 01/19/2023 0919   TRIG 37 01/19/2023 0919   HDL 55 01/19/2023 0919   CHOLHDL 2.5 01/19/2023 0919   CHOLHDL 2.2 01/07/2021 0245   VLDL 7 01/07/2021 0245   LDLCALC 72 01/19/2023 0919    Physical Exam:    Vital Signs: There were no vitals taken for this visit.    Wt Readings from Last 3 Encounters:  04/12/24 147 lb (66.7 kg)  03/09/24 150 lb (68 kg)  01/26/24 152 lb 9.6 oz (69.2 kg)     General: 73 y.o. male in no acute distress. HEENT: Normocephalic and atraumatic. Sclera clear.  Neck: Supple. No carotid bruits. No JVD. Heart: *** RRR. Distinct S1 and S2. No murmurs, gallops, or rubs.  Lungs: No increased work of breathing. Clear to ausculation bilaterally. No wheezes, rhonchi, or rales.  Abdomen: Soft, non-distended, and non-tender to palpation.  Extremities: No lower extremity edema.  Radial and distal pedal pulses 2+ and equal bilaterally. Skin: Warm and dry. Neuro: No focal deficits. Psych: Normal affect. Responds appropriately.   Assessment:    No diagnosis found.  Plan:     Disposition: Follow up in ***   Signed, Aline FORBES Door, PA-C  07/02/2024 3:20 PM    [facility]

## 2024-07-03 ENCOUNTER — Ambulatory Visit: Admitting: Student

## 2024-07-04 ENCOUNTER — Ambulatory Visit

## 2024-07-04 DIAGNOSIS — R471 Dysarthria and anarthria: Secondary | ICD-10-CM | POA: Diagnosis not present

## 2024-07-04 NOTE — Therapy (Signed)
 OUTPATIENT SPEECH LANGUAGE PATHOLOGY PARKINSON'S TREATMENT/PROGRESS NOTE   Patient Name: [name] MRN: [id number] DOB:1951/02/20, 73 y.o., male Today's Date: 07/04/2024  PCP: [name], MD REFERRING PROVIDER: [name], NP  END OF SESSION:  End of Session - 07/04/24 1624     Visit Number 11    Number of Visits 13    Date for Recertification  08/01/24    Authorization Type [facility] medicare 13 visits through 07/18/24    Authorization - Visit Number 11    Authorization - Number of Visits 13    SLP Start Time 1445    SLP Stop Time  1528    SLP Time Calculation (min) 43 min    Activity Tolerance Patient tolerated treatment well             Past Medical History:  Diagnosis Date   Allergy    seasonal   Anxiety    Arthritis    BPH (benign prostatic hyperplasia)    Complication of anesthesia    Pt. stated he had a reaction that ended in him requiring urinary cath placement; hx delirium worsening parkinson symptoms   Depression    Diverticulosis    GERD (gastroesophageal reflux disease)    esophageal spasms   Glaucoma    Gout    Head injury, closed, with concussion    Hepatitis C    chronic - Has been treated with Harvoni   HLD (hyperlipidemia)    statin intolerant (Crestor  & Simvastatin ) - Taking Livalo  1mg  / week   Hypertension    Neuromuscular disorder (HCC)    Parkinson's Disease   Parkinson's disease (HCC)    Plantar fasciitis    right   PVD (peripheral vascular disease)    With no claudication; only mild abdominal aortic atherosclerosis noted on ultrasound.   Sleep apnea    Wears CPAP nightly   Spinal stenosis of lumbar region    Thoracic ascending aortic aneurysm    4.2 cm ascending TAA 09/2016 CT, 1 yr f/u rec   Ulcer    Past Surgical History:  Procedure Laterality Date   ANTERIOR LAT LUMBAR FUSION N/A 12/31/2023   Procedure: Prone trans-psoas interbody fusion - Lumbar three-Lumbar four - Lumbar four-Lumbar five, right facetectomy Lumbar  four-five;  Surgeon: [name], MD;  Location: [facility];  Service: Neurosurgery;  Laterality: N/A;   BUNIONECTOMY WITH WEIL OSTEOTOMY Right 11/02/2019   Procedure: Right Foot Lapidus, Modified McBride Bunionectomy,  Hallux Akin Osteotomy;  Surgeon: [name], MD;  Location: [facility];  Service: Orthopedics;  Laterality: Right;   CARDIAC CATHETERIZATION  2005   30% Cx. Dr. Lavon   cataract surgery Left 11/05/2014   COLONOSCOPY     COLONOSCOPY N/A 01/09/2021   Procedure: COLONOSCOPY;  Surgeon: [name], MD;  Location: [facility];  Service: Gastroenterology;  Laterality: N/A;   ESOPHAGOGASTRODUODENOSCOPY N/A 05/02/2015   Procedure: ESOPHAGOGASTRODUODENOSCOPY (EGD);  Surgeon: [name], MD;  Location: [facility];  Service: Endoscopy;  Laterality: N/A;   ESOPHAGOGASTRODUODENOSCOPY  05/02/2015   no source of pt chest pain endoscopically evident. small hiatal hernia.   ESOPHAGOGASTRODUODENOSCOPY (EGD) WITH PROPOFOL  N/A 01/02/2021   Procedure: ESOPHAGOGASTRODUODENOSCOPY (EGD) WITH PROPOFOL ;  Surgeon: [name], MD;  Location: [facility];  Service: Endoscopy;  Laterality: N/A;   HARDWARE REMOVAL Right 11/02/2019   Procedure: Second Metatarsal Removal of Deep Implant and Rotational Osteotomy;  Surgeon: [name], MD;  Location: [facility];  Service: Orthopedics;  Laterality: Right;   IR ANGIOGRAM  SELECTIVE EACH ADDITIONAL VESSEL  01/04/2021   IR ANGIOGRAM SELECTIVE EACH ADDITIONAL VESSEL  01/04/2021   IR ANGIOGRAM VISCERAL SELECTIVE  01/04/2021   IR US  GUIDE VASC ACCESS RIGHT  01/04/2021   LUMBAR LAMINECTOMY/DECOMPRESSION MICRODISCECTOMY N/A 07/03/2021   Procedure: Lumbar three through five decompression with lumbar three through five insitu fusion;  Surgeon: [name], MD;  Location: [facility];  Service: Orthopedics;  Laterality: N/A;   LUMBAR PERCUTANEOUS PEDICLE SCREW 2 LEVEL N/A 12/31/2023   Procedure: LUMBAR PERCUTANEOUS PEDICLE SCREW  LUMBAR THREE-LUMBAR FIVE;  Surgeon: [name], MD;  Location: [facility];  Service: Neurosurgery;  Laterality: N/A;   MEMBRANE PEEL Left 03/14/2014   Procedure: MEMBRANE PEEL; ENDOLASER;  Surgeon: [name], MD;  Location: [facility];  Service: Ophthalmology;  Laterality: Left;   NM MYOVIEW  LTD  10/2015   LOW RISK. Small, fixed basal lateral defect - likely diaphragmatic attenuation. EF 69%   PARS PLANA VITRECTOMY Left 03/14/2014   Procedure: PARS PLANA VITRECTOMY WITH 25 GAUGE;  Surgeon: [name], MD;  Location: [facility] OR;  Service: Ophthalmology;  Laterality: Left;   shave  02/03/2022   angiofibroma   TONSILLECTOMY     TRANSTHORACIC ECHOCARDIOGRAM  01/07/2021   Normal EF 60 to 65%.  No R WMA.  GRII DD-moderately dilated LA..  Mildly dilated RV but normal function.  Normal RAP/CVP.  Trivial AI with mild to moderate sclerosis-no stenosis   UMBILICAL HERNIA REPAIR  2023   UPPER GASTROINTESTINAL ENDOSCOPY     WEIL OSTEOTOMY Right 09/01/2017   Procedure: RIGHT GREAT TOE CHEVRON AND WEIL OSTEOTOMY 2ND METATARSAL;  Surgeon: [name], MD;  Location: [facility];  Service: Orthopedics;  Laterality: Right;   XI ROBOTIC ASSISTED VENTRAL HERNIA N/A 03/17/2022   Procedure: XI ROBOTIC ASSISTED VENTRAL HERNIA;  Surgeon: [name], MD;  Location: [facility];  Service: General;  Laterality: N/A;   Patient Active Problem List   Diagnosis Date Noted   Chronic hepatitis C (HCC) 03/09/2024   Malnutrition of moderate degree 01/06/2024   Spondylolisthesis at L4-L5 level 12/31/2023   Supine hypertension 12/03/2023   Parkinson's disease with neurogenic orthostatic hypotension (HCC) 12/03/2023   Preop cardiovascular exam 12/03/2023   Glaucoma 11/30/2023   Retinal vein thrombosis (HCC) 11/30/2023   Acquired hallux rigidus of right foot 11/23/2022   Ventral hernia without obstruction or gangrene    Primary open angle glaucoma of left eye, mild stage 06/23/2021   Facial numbness 12/24/2020   Neovascular  glaucoma, right eye, stage unspecified 12/16/2020   Lumbar radiculopathy 11/25/2020   Gastroesophageal reflux disease 09/10/2020   Dry eyes, bilateral 08/15/2020   Presbycusis of right ear 01/17/2020   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) 12/12/2019   Secondary corneal edema of right eye 12/12/2019   Ptosis of right eyelid 12/12/2019   OAB (overactive bladder) 11/29/2019   Bunion of great toe of right foot 07/14/2017   Claw toe, acquired, right 07/14/2017   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   Medication management 10/09/2016   Plantar fasciitis of right foot 02/19/2015   Depression 08/14/2014   Family history of heart disease in male family member before age 74 04/26/2014   Mild cognitive impairment 03/13/2014   Parkinsonian tremor (HCC) 02/12/2014   Hyperlipidemia with target LDL less than 100    Abdominal aortic atherosclerosis    Moderate essential hypertension 02/25/2011   Arthropathy 02/25/2011   HAV (hallux abducto valgus) 02/25/2011  ONSET DATE: 02/08/24 (referral date)  REFERRING DIAG: G20.A2 (ICD-10-CM) - Parkinson's disease without dyskinesia, with fluctuating manifestations (HCC)  THERAPY DIAG:  Dysarthria and anarthria  Rationale for Evaluation and Treatment: Rehabilitation  SUBJECTIVE:   SUBJECTIVE STATEMENT: I have been practicing sometimes. Reports a mix of online and workbook activities.  Pt accompanied by: significant other Bea  PERTINENT HISTORY: recent back surgery (L3-5) on 12/31/23, Parkinson's disease causing autonomic dysregulation and orthostatic hypotension, leading to blood pressure fluctuations and dizziness upon standing. Lumbar radiculopathy (Chronic), PVD, HLD, spinal stenosis, BPH   Post-surgical recovery complicated by inappropriate medication management and inadequate rehabilitation, with delirium post-anesthesia likely exacerbated by Parkinson's disease.      PAIN:  Are you having pain? No   PATIENT GOALS: To remember what was said                                                                                                                           TREATMENT DATE:   07/04/24: Pt notes that he has not practiced much due to having eye issues.  Targeted volume and intelligibility using Speak Out! Lesson 11. Pt required frequent min A verbal cues, modeling, for volume and breath support.   Pt averages the following volume levels:  Sustained AH: 85 dB  Counting: 83 dB  Reading (phrases): 79 dB  Cognitive Exercise: 76 dB Required frequent min verbal cues, modeling for carryover of intent /volume answering simple questions following structured practice. During cognitive/conversational exercise, pt displayed short-term memory difficulties, forgetting specific instructions for rules. SLP inquired about short-term memory difficulties at home; pt states that he has difficulty with remembering what was said in previous conversations. SLP instructed pt in utilizing a memory journal during conversations to write down important information for later recall. Pt is agreeable about utilizing a memory journal. Pt states that he will place his memory journal on his kitchen table to remember to use it. Plan is to continue SPEAK OUT! Program, conversation-level tx, and further development of cognitive support systems (external memory aids).    06/27/24: Pt reports that he is practicing sometimes since prior session. Pt states that he is realizing that he needs to practice more to improve speaking with intent.  Targeted volume and intelligibility using Speak Out! Lesson 10. Pt required frequent min verbal cues, modeling, for volume and breath support.   Pt averages the following volume levels:  Sustained AH: 86 dB  Counting: 77 dB  Reading (phrases): 76 dB  Cognitive Exercise: 73 dB Required mod to min verbal cues, modeling for carryover of intent  /volume answering simple questions following structured practice. Pt is making progress with dysarthria goals. Pt is working toward improving his compliance with HEP. SLP collaborated with pt on building a HEP program for carry-over of SPEAK OUT! Strategies. Pt plans on practicing at least 20 minutes/day until upcoming session. Pt is making progress with    06/21/24: Garrel reports completing HEP not as much as I should He  has had inconsistent schedule of ST sessions - will add 4 more visits to max carryover of HEP and intelligible speech outside of ST.  Targeted volume and intelligibility using Speak Out! Lesson November week 1 from [employer]. Pt required occasional lmin verbal cues, modeling, for volume and breath support.  Pt averages the following volume levels:  Sustained AH: 88dB  Counting:83dB  Reading (phrases): 84dB  Cognitive Exercise: 74dB with frequent mod A Required usual min to mod verbal cues, modeling for carryover of intent /volume in spontaneous conversation. Targeted generating a plan for carryover of HEP consistently   06/07/24:  Targeted volume and intelligibility using Speak Out! Lesson 7. Pt required frequent min A verbal cues, modeling, for volume and breath support.   Pt averages the following volume levels:  Sustained AH: 88 dB  Counting: 80 dB  Reading (phrases): 80 dB  Cognitive Exercise: 75 dB Required frequent min A verbal cues, modeling for carryover of intent /volume answering simple questions following structured practice.    05/24/24: Target improving vocal quality and increasing intensity through progressively difficulty speech tasks using Speak Out! program, Lesson October week 3 - he averaged 75dB on reading halloween sentences - modified conversation task to divergent naming costumes - he averaged 74dB with rare min A - he required usual min to mod semantic cues to name 18 costumes. Reading 3 pages of his personally relevant phrases with average of 74dB  and rare min A - occasional extended time to read phrase correctly.   05/08/24: Target improving vocal quality and increasing intensity through progressively difficulty speech tasks using Speak Out! program, lesson [place] from [employer] online book. ST leads pt through exercises providing usual model prior to pt execution. occasional mod-A required to achieve target dB this date. Averages this date:  Resonant Vowels: 86 dB  Sustained ah 86 dB -- cues needed to avoid volume decay; unable to mitigate the volume decay with high to low glide  Counting: 81 dB  Reading 76 dB (visual challenges appeared to be a barrier)  Cognitive speech task 78 dB -- no cues needed Conversational sample of approx 10 minutes, pt averages 71 dB with occasional min-A.  05/02/24: Garrel enters with sub WNL volume. Targeted memory of SPEAK OUT! Exercises - with usual mod cues to recall exercises and to read instructions and required reps. He required initial modeling to use intent with Ah to average 88dB, 84dB on glides, 80dB on counting . Generated 6 more personally relevant sentences - Targeted reading and speaking with intent reading 4 words and explaining which does not belong and why - reading averaged 74dB, explanation averaged 70dB with usual min to mod verbal cues and modeling. Homework continues to be to complete an online home practice session. In   04/26/24:  Garrel enters with SPEAK OUT! Booklet - He has completed lessons consistently since receiving the booklet. Today, targeted intelligibility and volume using e Library A Year of Intent - October 1st week. Garrel required consistent modeling and cueing to complete each exercise correctly and with the most intent. Sustained Ah - 85dB, counting 82dB. Reading - 72dB, conversation exercises - 68dB - Garrel required frequent mod semantic cues and phrase completion cues to name 5 items in given categories and frequent mod verbal cues and modeling to use intent when  cognitive load increases.   03/1724: Garrel received his SPEAK OUT! Booklet. They have watched the What is Parkinson's video and will have their children watch it as  well.  Targeted volume and intelligibility using Speak Out! Lesson 2. Pt required initial usual mod verbal cues, modeling, for volume and breath support, fading to occasional min A.  Pt averages the following volume levels:  Sustained AH: 84dB  Counting:80dB  Reading (phrases): 75dB  Cognitive Exercise: 72dB with frequent mod A Required  verbal cues, modeling for carryover of intent /volume answering simple questions following structured practice. Generated list of personally relevant phrases to practice with intent, including name, number DOB, address, restaurant orders and script explaining that he has PD and may have trouble speaking- See Patient instructions   04/04/24: Evaluation completed. Introduced [employer] - ordered ANHEUSER-[employer]CH OUT! Booklet and [employer]. Demonstrated navigating website to locate What is Parkinsons video and home practice sessions. Initiated SPEAK OUT! Lesson 1 to  target volume, intelligibility and speaking with intent as well as cognitive communication exercises. With usual min to mod A, [name] averaged 79dB on sustained Ah, 76dB on glides, 78dB counting and 75dB reading. In conversation/cognitive exercise, with verbal cues and modeling, he averaged 71dB. Instructed them to complete Lesson one again in [employer], as well at to complete home practice sessions on website.    PATIENT EDUCATION: Education details: HEP for dysarthria, swallow precautions, compensations for slow processing and following conversation Person educated: Patient and Spouse Education method: Programmer, Multimedia, Facilities Manager, Verbal cues, and Handouts Education comprehension: verbal cues required and needs further education  HOME EXERCISE PROGRAM: SPEAK OUT!   GOALS: Goals reviewed with patient? Yes  SHORT TERM  GOALS: Target date: 05/29/24 (extended due to scheduling)  Pt will complete HEP for dysarthria with occasional min A over 2 sessions Baseline: Goal status: MET  2.  Pt will average 74dB 18/20 sentences Baseline:  Goal status: MET  3.  Pt will follow swallow precautions with occasional min A Baseline:  Goal status: MET  4.  Pt and spouse will carryover 2 compensatory strategies to support slow processing  Baseline:  Goal status: MET  5.  Pt will average 72dB over 8 minute conversation Baseline:  Goal status: MET   LONG TERM GOALS: LTG=STGs Target date: 06/27/24  Pt will complete HEP with rare min A over 2 sessions Baseline:  Goal status: ONGOING/NOT MET  2.  Pt will complete 3 online home practice sessions Baseline:  Goal status: ONGOING/NOT MET  3.  Pt will access E Library and complete 3 lessons over 2 weeks with min A from spouse Baseline:  Goal status: ONGOING/NOT MET  4.  Pt will report no episodes of globus sensation with pills Baseline:  Goal status: ONGOING/NOT MET  5.  Pt will average 72dB over 15 minute conversation with rare min A Baseline:  Goal status: ONGOING/NOT MET  6.  Pt and spouse will carry over 4 compensatory strategies to support slow processing in conversation and directions Baseline:  Goal status:ONGOING/NOT MET  ASSESSMENT:  CLINICAL IMPRESSION: Patient is a 73 y.o. 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 He is completing HEP with rare to occasional min A to achieve target dB and maintain clear, intelligible speech. In conversation, he continues to require frequent min to mod A to carryover intelligible speech into conversation. Spouse reports she continues to have to cue Garrel to speak with intent. Garrel requires ongoing ST to maximize carryover of HEP and to carryover volume and clear phonation outside of ST. I recommend skilled ST to maximize  intelligibility, safety of swallow  and cognition for safety, to reduce caregiver burden and improve participation in conversations for QOL. UPDATE (07/04/2024): Pt continues to make progress with speech goals. Pt's compliance with HEP has been inconsistent; however, pt continues to improve compliance with HEP. Pt is progressing into conversation-level tx.   OBJECTIVE IMPAIRMENTS: Objective impairments include attention, memory, awareness, executive functioning, dysarthria, and dysphagia. These impairments are limiting patient from managing medications, managing appointments, managing finances, household responsibilities, ADLs/IADLs, effectively communicating at home and in community, and safety when swallowing.Factors affecting potential to achieve goals and functional outcome are ability to learn/carryover information and medical prognosis.. Patient will benefit from skilled SLP services to address above impairments and improve overall function.  REHAB POTENTIAL: Good  PLAN:  SLP FREQUENCY: 1-2x/week  SLP DURATION: 12 weeks  PLANNED INTERVENTIONS: Aspiration precaution training, Pharyngeal strengthening exercises, Diet toleration management , Language facilitation, Environmental controls, Trials of upgraded texture/liquids, Cueing hierachy, Cognitive reorganization, Internal/external aids, Functional tasks, Multimodal communication approach, SLP instruction and feedback, Compensatory strategies, Patient/family education, [phone] Treatment of speech (30 or 45 min) , [id number] Treatment of swallowing function, [phone]- Speech [phone], Artic, Phon, Eval Compre, Express, and MBSS    W[employer]ell [employer], CF-SLP 07/04/2024, 4:26 PM

## 2024-07-04 NOTE — Patient Instructions (Signed)
 Use note pad as a memory journal:  Write down important facts from conversation. Its home is the kitchen table.

## 2024-07-04 NOTE — Telephone Encounter (Signed)
 I have tried to explain the progression of memory in multiple prior mychart visits and the importance of CPAP usage. He has not seen Dr. Rosemarie in several years, can you add him on to Dr. Bucky schedule somewhere to discuss this further? He can also be scheduled with Dr. Buck to discuss CPAP concerns as he has not seen her since initial consult visit.

## 2024-07-05 ENCOUNTER — Other Ambulatory Visit (HOSPITAL_COMMUNITY): Payer: Self-pay

## 2024-07-07 ENCOUNTER — Other Ambulatory Visit: Payer: Self-pay

## 2024-07-09 ENCOUNTER — Other Ambulatory Visit: Payer: Self-pay

## 2024-07-10 ENCOUNTER — Other Ambulatory Visit: Payer: Self-pay

## 2024-07-10 ENCOUNTER — Other Ambulatory Visit (HOSPITAL_COMMUNITY): Payer: Self-pay

## 2024-07-11 ENCOUNTER — Other Ambulatory Visit (HOSPITAL_COMMUNITY): Payer: Self-pay

## 2024-07-11 ENCOUNTER — Ambulatory Visit

## 2024-07-11 ENCOUNTER — Other Ambulatory Visit: Payer: Self-pay

## 2024-07-11 ENCOUNTER — Telehealth: Payer: Self-pay | Admitting: Adult Health

## 2024-07-11 DIAGNOSIS — R471 Dysarthria and anarthria: Secondary | ICD-10-CM

## 2024-07-11 DIAGNOSIS — R41841 Cognitive communication deficit: Secondary | ICD-10-CM

## 2024-07-11 NOTE — Telephone Encounter (Signed)
 There was a clinical cytogeneticist message sent too and harlene NP recommended appts with Dr Rosemarie and Dr Buck. I have messaged the wife back in [place] offering appts with both providers for early/mid January.

## 2024-07-11 NOTE — Therapy (Signed)
 OUTPATIENT SPEECH LANGUAGE PATHOLOGY PARKINSON'S TREATMENT   Patient Name: [name] MRN: [id number] DOB:Feb 16, 1951, 73 y.o., male Today's Date: 07/11/2024  PCP: [name], MD REFERRING PROVIDER: [name], NP  END OF SESSION:       Past Medical History:  Diagnosis Date   Allergy    seasonal   Anxiety    Arthritis    BPH (benign prostatic hyperplasia)    Complication of anesthesia    Pt. stated he had a reaction that ended in him requiring urinary cath placement; hx delirium worsening parkinson symptoms   Depression    Diverticulosis    GERD (gastroesophageal reflux disease)    esophageal spasms   Glaucoma    Gout    Head injury, closed, with concussion    Hepatitis C    chronic - Has been treated with Harvoni   HLD (hyperlipidemia)    statin intolerant (Crestor  & Simvastatin ) - Taking Livalo  1mg  / week   Hypertension    Neuromuscular disorder (HCC)    Parkinson's Disease   Parkinson's disease (HCC)    Plantar fasciitis    right   PVD (peripheral vascular disease)    With no claudication; only mild abdominal aortic atherosclerosis noted on ultrasound.   Sleep apnea    Wears CPAP nightly   Spinal stenosis of lumbar region    Thoracic ascending aortic aneurysm    4.2 cm ascending TAA 09/2016 CT, 1 yr f/u rec   Ulcer    Past Surgical History:  Procedure Laterality Date   ANTERIOR LAT LUMBAR FUSION N/A 12/31/2023   Procedure: Prone trans-psoas interbody fusion - Lumbar three-Lumbar four - Lumbar four-Lumbar five, right facetectomy Lumbar four-five;  Surgeon: [name], MD;  Location: [facility];  Service: Neurosurgery;  Laterality: N/A;   BUNIONECTOMY WITH WEIL OSTEOTOMY Right 11/02/2019   Procedure: Right Foot Lapidus, Modified McBride Bunionectomy,  Hallux Akin Osteotomy;  Surgeon: [name], MD;  Location: [facility];  Service: Orthopedics;  Laterality: Right;   CARDIAC CATHETERIZATION  2005   30% Cx. Dr. Lavon    cataract surgery Left 11/05/2014   COLONOSCOPY     COLONOSCOPY N/A 01/09/2021   Procedure: COLONOSCOPY;  Surgeon: [name], MD;  Location: [facility];  Service: Gastroenterology;  Laterality: N/A;   ESOPHAGOGASTRODUODENOSCOPY N/A 05/02/2015   Procedure: ESOPHAGOGASTRODUODENOSCOPY (EGD);  Surgeon: [name], MD;  Location: [facility];  Service: Endoscopy;  Laterality: N/A;   ESOPHAGOGASTRODUODENOSCOPY  05/02/2015   no source of pt chest pain endoscopically evident. small hiatal hernia.   ESOPHAGOGASTRODUODENOSCOPY (EGD) WITH PROPOFOL  N/A 01/02/2021   Procedure: ESOPHAGOGASTRODUODENOSCOPY (EGD) WITH PROPOFOL ;  Surgeon: [name], MD;  Location: [facility];  Service: Endoscopy;  Laterality: N/A;   HARDWARE REMOVAL Right 11/02/2019   Procedure: Second Metatarsal Removal of Deep Implant and Rotational Osteotomy;  Surgeon: [name], MD;  Location: [facility];  Service: Orthopedics;  Laterality: Right;   IR ANGIOGRAM SELECTIVE EACH ADDITIONAL VESSEL  01/04/2021   IR ANGIOGRAM SELECTIVE EACH ADDITIONAL VESSEL  01/04/2021   IR ANGIOGRAM VISCERAL SELECTIVE  01/04/2021   IR US  GUIDE VASC ACCESS RIGHT  01/04/2021   LUMBAR LAMINECTOMY/DECOMPRESSION MICRODISCECTOMY N/A 07/03/2021   Procedure: Lumbar three through five decompression with lumbar three through five insitu fusion;  Surgeon: [name], MD;  Location: [facility] OR;  Service: Orthopedics;  Laterality: N/A;   LUMBAR PERCUTANEOUS PEDICLE SCREW 2 LEVEL N/A 12/31/2023   Procedure: LUMBAR PERCUTANEOUS PEDICLE SCREW LUMBAR THREE-LUMBAR FIVE;  Surgeon: [name], MD;  Location: [facility];  Service: Neurosurgery;  Laterality: N/A;   MEMBRANE PEEL Left 03/14/2014   Procedure: MEMBRANE PEEL; ENDOLASER;  Surgeon: [name], MD;  Location: [facility];  Service: Ophthalmology;  Laterality: Left;   NM MYOVIEW  LTD  10/2015   LOW RISK. Small, fixed basal lateral defect - likely diaphragmatic attenuation. EF 69%   PARS PLANA  VITRECTOMY Left 03/14/2014   Procedure: PARS PLANA VITRECTOMY WITH 25 GAUGE;  Surgeon: [name], MD;  [facility];  Service: Ophthalmology;  Laterality: Left;   shave  02/03/2022   angiofibroma   TONSILLECTOMY     TRANSTHORACIC ECHOCARDIOGRAM  01/07/2021   Normal EF 60 to 65%.  No R WMA.  GRII DD-moderately dilated LA..  Mildly dilated RV but normal function.  Normal RAP/CVP.  Trivial AI with mild to moderate sclerosis-no stenosis   UMBILICAL HERNIA REPAIR  2023   UPPER GASTROINTESTINAL ENDOSCOPY     WEIL OSTEOTOMY Right 09/01/2017   Procedure: RIGHT GREAT TOE CHEVRON AND WEIL OSTEOTOMY 2ND METATARSAL;  Surgeon: [name], MD;  Location: [facility];  Service: Orthopedics;  Laterality: Right;   XI ROBOTIC ASSISTED VENTRAL HERNIA N/A 03/17/2022   Procedure: XI ROBOTIC ASSISTED VENTRAL HERNIA;  Surgeon: [name], MD;  Location: [facility];  Service: General;  Laterality: N/A;   Patient Active Problem List   Diagnosis Date Noted   Chronic hepatitis C (HCC) 03/09/2024   Malnutrition of moderate degree 01/06/2024   Spondylolisthesis at L4-L5 level 12/31/2023   Supine hypertension 12/03/2023   Parkinson's disease with neurogenic orthostatic hypotension (HCC) 12/03/2023   Preop cardiovascular exam 12/03/2023   Glaucoma 11/30/2023   Retinal vein thrombosis (HCC) 11/30/2023   Acquired hallux rigidus of right foot 11/23/2022   Ventral hernia without obstruction or gangrene    Primary open angle glaucoma of left eye, mild stage 06/23/2021   Facial numbness 12/24/2020   Neovascular glaucoma, right eye, stage unspecified 12/16/2020   Lumbar radiculopathy 11/25/2020   Gastroesophageal reflux disease 09/10/2020   Dry eyes, bilateral 08/15/2020   Presbycusis of right ear 01/17/2020   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) 12/12/2019   Secondary corneal edema of right eye 12/12/2019   Ptosis of right eyelid 12/12/2019   OAB (overactive bladder)  11/29/2019   Bunion of great toe of right foot 07/14/2017   Claw toe, acquired, right 07/14/2017   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   Medication management 10/09/2016   Plantar fasciitis of right foot 02/19/2015   Depression 08/14/2014   Family history of heart disease in male family member before age 21 04/26/2014   Mild cognitive impairment 03/13/2014   Parkinsonian tremor (HCC) 02/12/2014   Hyperlipidemia with target LDL less than 100    Abdominal aortic atherosclerosis    Moderate essential hypertension 02/25/2011   Arthropathy 02/25/2011   HAV (hallux abducto valgus) 02/25/2011    ONSET DATE: 02/08/24 (referral date)  REFERRING DIAG: G20.A2 (ICD-10-CM) - Parkinson's disease without dyskinesia, with fluctuating manifestations (HCC)  THERAPY DIAG:  Dysarthria and anarthria  Cognitive communication deficit  Rationale for Evaluation and Treatment: Rehabilitation  SUBJECTIVE:   SUBJECTIVE STATEMENT: I have been practicing sometimes. Reports a mix of online and workbook activities.  Pt accompanied by: significant other [name]  PERTINENT HISTORY: recent back surgery (L3-5) on 12/31/23, Parkinson's disease causing autonomic dysregulation and orthostatic hypotension, leading to blood pressure fluctuations and dizziness upon standing. Lumbar radiculopathy (Chronic), PVD, HLD, spinal stenosis, BPH  Post-surgical recovery complicated by inappropriate medication management and inadequate rehabilitation, with delirium post-anesthesia likely exacerbated by Parkinson's disease.     PAIN:  Are you having pain? No   PATIENT GOALS: To remember what was said                                                                                                                           TREATMENT DATE:   07/11/24: Pt notes that he is practicing 3-4x/week. He reports that his self-awareness of speaking with intent is improving with consistent practice.  SLP provided pt a homework tracking log and challenged pt to practice 5-6x in the upcoming week for improving speech production.  Targeted volume and intelligibility using Speak Out! Lesson 12. Pt required occasional min A verbal cues, modeling, for volume and breath support.   Pt averages the following volume levels:  Sustained AH: 86 dB  Counting: 80 dB  Reading (phrases): 77 dB  Cognitive Exercise: 73 dB Required occasional min A verbal cues, modeling for carryover of intent /volume answering simple questions following structured practice. SLP reviewed using a memory journal for optimizing pt short-term memory recall. Pt states that he has not acquired a memory journal; however, he as several note-pads he can use at home. SLP encouraged pt to get a memory journal for writing down important information. Pt is agreeable for attempting to use memory journal. Plan is to discuss memory journal usage and integrate speaking with intent into conversational speech along with SPEAK OUT! Program.   07/04/24: Pt notes that he has not practiced much due to having eye issues.  Targeted volume and intelligibility using Speak Out! Lesson 11. Pt required frequent min A verbal cues, modeling, for volume and breath support.   Pt averages the following volume levels:  Sustained AH: 85 dB  Counting: 83 dB  Reading (phrases): 79 dB  Cognitive Exercise: 76 dB Required frequent min verbal cues, modeling for carryover of intent /volume answering simple questions following structured practice. During cognitive/conversational exercise, pt displayed short-term memory difficulties, forgetting specific instructions for rules. SLP inquired about short-term memory difficulties at home; pt states that he has difficulty with remembering what was said in previous conversations. SLP instructed pt in utilizing a memory journal during conversations to write down important information for later recall. Pt is agreeable about  utilizing a memory journal. Pt states that he will place his memory journal on his kitchen table to remember to use it. Plan is to continue SPEAK OUT! Program, conversation-level tx, and further development of cognitive support systems (external memory aids).    06/27/24: Pt reports that he is practicing sometimes since prior session. Pt states that he is realizing that he needs to practice more to improve speaking with intent.  Targeted volume and intelligibility using Speak Out! Lesson 10. Pt required frequent min verbal cues, modeling, for volume and breath support.   Pt averages the following volume levels:  Sustained AH: 86 dB  Counting:  77 dB  Reading (phrases): 76 dB  Cognitive Exercise: 73 dB Required mod to min verbal cues, modeling for carryover of intent /volume answering simple questions following structured practice. Pt is making progress with dysarthria goals. Pt is working toward improving his compliance with HEP. SLP collaborated with pt on building a HEP program for carry-over of SPEAK OUT! Strategies. Pt plans on practicing at least 20 minutes/day until upcoming session. Pt is making progress with    06/21/24: [name] reports completing HEP not as much as I should He has had inconsistent schedule of ST sessions - will add 4 more visits to max carryover of HEP and intelligible speech outside of ST.  Targeted volume and intelligibility using Speak Out! Lesson November week 1 from [employer]. Pt required occasional lmin verbal cues, modeling, for volume and breath support.  Pt averages the following volume levels:  Sustained AH: 88dB  Counting:83dB  Reading (phrases): 84dB  Cognitive Exercise: 74dB with frequent mod A Required usual min to mod verbal cues, modeling for carryover of intent /volume in spontaneous conversation. Targeted generating a plan for carryover of HEP consistently   06/07/24:  Targeted volume and intelligibility using Speak Out! Lesson 7. Pt required  frequent min A verbal cues, modeling, for volume and breath support.   Pt averages the following volume levels:  Sustained AH: 88 dB  Counting: 80 dB  Reading (phrases): 80 dB  Cognitive Exercise: 75 dB Required frequent min A verbal cues, modeling for carryover of intent /volume answering simple questions following structured practice.    05/24/24: Target improving vocal quality and increasing intensity through progressively difficulty speech tasks using Speak Out! program, Lesson October week 3 - he averaged 75dB on reading halloween sentences - modified conversation task to divergent naming costumes - he averaged 74dB with rare min A - he required usual min to mod semantic cues to name 18 costumes. Reading 3 pages of his personally relevant phrases with average of 74dB and rare min A - occasional extended time to read phrase correctly.   05/08/24: Target improving vocal quality and increasing intensity through progressively difficulty speech tasks using Speak Out! program, lesson [place] from [employer] online book. ST leads pt through exercises providing usual model prior to pt execution. occasional mod-A required to achieve target dB this date. Averages this date:  Resonant Vowels: 86 dB  Sustained ah 86 dB -- cues needed to avoid volume decay; unable to mitigate the volume decay with high to low glide  Counting: 81 dB  Reading 76 dB (visual challenges appeared to be a barrier)  Cognitive speech task 78 dB -- no cues needed Conversational sample of approx 10 minutes, pt averages 71 dB with occasional min-A.  05/02/24: [name] enters with sub WNL volume. Targeted memory of SPEAK OUT! Exercises - with usual mod cues to recall exercises and to read instructions and required reps. He required initial modeling to use intent with Ah to average 88dB, 84dB on glides, 80dB on counting . Generated 6 more personally relevant sentences - Targeted reading and speaking with intent reading 4  words and explaining which does not belong and why - reading averaged 74dB, explanation averaged 70dB with usual min to mod verbal cues and modeling. Homework continues to be to complete an online home practice session. In   04/26/24:  [name] enters with SPEAK OUT! Booklet - He has completed lessons consistently since receiving the booklet. Today, targeted intelligibility and volume using e Library A Year  of Intent - October 1st week. [name] required consistent modeling and cueing to complete each exercise correctly and with the most intent. Sustained Ah - 85dB, counting 82dB. Reading - 72dB, conversation exercises - 68dB - [name] required frequent mod semantic cues and phrase completion cues to name 5 items in given categories and frequent mod verbal cues and modeling to use intent when cognitive load increases.   03/1724: [name] received his SPEAK OUT! Booklet. They have watched the What is Parkinson's video and will have their children watch it as well.  Targeted volume and intelligibility using Speak Out! Lesson 2. Pt required initial usual mod verbal cues, modeling, for volume and breath support, fading to occasional min A.  Pt averages the following volume levels:  Sustained AH: 84dB  Counting:80dB  Reading (phrases): 75dB  Cognitive Exercise: 72dB with frequent mod A Required  verbal cues, modeling for carryover of intent /volume answering simple questions following structured practice. Generated list of personally relevant phrases to practice with intent, including name, number DOB, address, restaurant orders and script explaining that he has PD and may have trouble speaking- See Patient instructions   04/04/24: Evaluation completed. Introduced [employer] - ordered ANHEUSER-[employer] OUT! Booklet and [employer]. Demonstrated navigating website to locate What is Parkinsons video and home practice sessions. Initiated SPEAK OUT! Lesson 1 to  target volume, intelligibility and speaking with  intent as well as cognitive communication exercises. With usual min to mod A, [name] averaged 79dB on sustained Ah, 76dB on glides, 78dB counting and 75dB reading. In conversation/cognitive exercise, with verbal cues and modeling, he averaged 71dB. Instructed them to complete Lesson one again in [employer], as well at to complete home practice sessions on website.    PATIENT EDUCATION: Education details: HEP for dysarthria, swallow precautions, compensations for slow processing and following conversation Person educated: Patient and Spouse Education method: Programmer, Multimedia, Facilities Manager, Verbal cues, and Handouts Education comprehension: verbal cues required and needs further education  HOME EXERCISE PROGRAM: SPEAK OUT!   GOALS: Goals reviewed with patient? Yes  SHORT TERM GOALS: Target date: 05/29/24 (extended due to scheduling)  Pt will complete HEP for dysarthria with occasional min A over 2 sessions Baseline: Goal status: MET  2.  Pt will average 74dB 18/20 sentences Baseline:  Goal status: MET  3.  Pt will follow swallow precautions with occasional min A Baseline:  Goal status: MET  4.  Pt and spouse will carryover 2 compensatory strategies to support slow processing  Baseline:  Goal status: MET  5.  Pt will average 72dB over 8 minute conversation Baseline:  Goal status: MET   LONG TERM GOALS: LTG=STGs Target date: 06/27/24  Pt will complete HEP with rare min A over 2 sessions Baseline:  Goal status: ONGOING/NOT MET  2.  Pt will complete 3 online home practice sessions Baseline:  Goal status: ONGOING/NOT MET  3.  Pt will access E Library and complete 3 lessons over 2 weeks with min A from spouse Baseline:  Goal status: ONGOING/NOT MET  4.  Pt will report no episodes of globus sensation with pills Baseline:  Goal status: ONGOING/NOT MET  5.  Pt will average 72dB over 15 minute conversation with rare min A Baseline:  Goal status: ONGOING/NOT MET  6.  Pt and  spouse will carry over 4 compensatory strategies to support slow processing in conversation and directions Baseline:  Goal status:ONGOING/NOT MET  ASSESSMENT:  CLINICAL IMPRESSION: Patient is a 73 y.o. 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 He is completing HEP with rare to occasional min A to achieve target dB and maintain clear, intelligible speech. In conversation, he continues to require frequent min to mod A to carryover intelligible speech into conversation. Spouse reports she continues to have to cue [name] to speak with intent. [name] requires ongoing ST to maximize carryover of HEP and to carryover volume and clear phonation outside of ST. I recommend skilled ST to maximize intelligibility, safety of swallow and cognition for safety, to reduce caregiver burden and improve participation in conversations for QOL. UPDATE (07/04/2024): Pt continues to make progress with speech goals. Pt's compliance with HEP has been inconsistent; however, pt continues to improve compliance with HEP. Pt is progressing into conversation-level tx.   OBJECTIVE IMPAIRMENTS: Objective impairments include attention, memory, awareness, executive functioning, dysarthria, and dysphagia. These impairments are limiting patient from managing medications, managing appointments, managing finances, household responsibilities, ADLs/IADLs, effectively communicating at home and in community, and safety when swallowing.Factors affecting potential to achieve goals and functional outcome are ability to learn/carryover information and medical prognosis.. Patient will benefit from skilled SLP services to address above impairments and improve overall function.  REHAB POTENTIAL: Good  PLAN:  SLP FREQUENCY: 1-2x/week  SLP DURATION: 12 weeks  PLANNED INTERVENTIONS: Aspiration precaution training, Pharyngeal strengthening exercises, Diet toleration  management , Language facilitation, Environmental controls, Trials of upgraded texture/liquids, Cueing hierachy, Cognitive reorganization, Internal/external aids, Functional tasks, Multimodal communication approach, SLP instruction and feedback, Compensatory strategies, Patient/family education, [phone] Treatment of speech (30 or 45 min) , [id number] Treatment of swallowing function, [phone]- Speech [phone], Artic, Phon, Eval Compre, Express, and MBSS    [employer], CF-SLP 07/11/2024, 1:11 PM

## 2024-07-11 NOTE — Telephone Encounter (Signed)
 Patient's wife, Zerek Litsey. Patient experiencing fogginess, memory loss (did not remember after 5 mins of being told something).  Having problem with CPAP making a noise and wakes up the patient. Contact company, said if can not fix it will replace machine. Would like a call back to discuss if dosage needs to be increased and can get an earlier appointment

## 2024-07-11 NOTE — Telephone Encounter (Signed)
 Looks like he is having some leaks, unclear if this is the noise they are hearing but limited usage makes this hard to fully assess. Would recommend f/u with DME for further evaluation of machine and/or mask. What medication is wife requesting increased dose of?

## 2024-07-12 ENCOUNTER — Other Ambulatory Visit: Payer: Self-pay

## 2024-07-18 ENCOUNTER — Ambulatory Visit

## 2024-07-18 DIAGNOSIS — R471 Dysarthria and anarthria: Secondary | ICD-10-CM | POA: Diagnosis not present

## 2024-07-18 DIAGNOSIS — R41841 Cognitive communication deficit: Secondary | ICD-10-CM

## 2024-07-18 NOTE — Therapy (Signed)
 " OUTPATIENT SPEECH LANGUAGE PATHOLOGY PARKINSON'S TREATMENT   Patient Name: [name] MRN: [id number] DOB:[birth year], 73 y.o., male Today's Date: 07/18/2024  PCP: [name], MD REFERRING PROVIDER: [name], NP  END OF SESSION:  End of Session - 07/18/24 1442     Visit Number 13    Number of Visits 13    Date for Recertification  08/29/24    Authorization Type [facility] medicare 13 visits through 07/18/24    Authorization - Visit Number 13    SLP Start Time 1405    SLP Stop Time  1443    SLP Time Calculation (min) 38 min    Activity Tolerance Patient tolerated treatment well              Past Medical History:  Diagnosis Date   Allergy    seasonal   Anxiety    Arthritis    BPH (benign prostatic hyperplasia)    Complication of anesthesia    Pt. stated he had a reaction that ended in him requiring urinary cath placement; hx delirium worsening parkinson symptoms   Depression    Diverticulosis    GERD (gastroesophageal reflux disease)    esophageal spasms   Glaucoma    Gout    Head injury, closed, with concussion    Hepatitis C    chronic - Has been treated with Harvoni   HLD (hyperlipidemia)    statin intolerant (Crestor  & Simvastatin ) - Taking Livalo  1mg  / week   Hypertension    Neuromuscular disorder (HCC)    Parkinson's Disease   Parkinson's disease (HCC)    Plantar fasciitis    right   PVD (peripheral vascular disease)    With no claudication; only mild abdominal aortic atherosclerosis noted on ultrasound.   Sleep apnea    Wears CPAP nightly   Spinal stenosis of lumbar region    Thoracic ascending aortic aneurysm    4.2 cm ascending TAA 09/2016 CT, 1 yr f/u rec   Ulcer    Past Surgical History:  Procedure Laterality Date   ANTERIOR LAT LUMBAR FUSION N/A 12/31/2023   Procedure: Prone trans-psoas interbody fusion - Lumbar three-Lumbar four - Lumbar four-Lumbar five, right facetectomy Lumbar four-five;  Surgeon: [name], MD;   Location: [facility] OR;  Service: Neurosurgery;  Laterality: N/A;   BUNIONECTOMY WITH WEIL OSTEOTOMY Right 11/02/2019   Procedure: Right Foot Lapidus, Modified McBride Bunionectomy,  Hallux Akin Osteotomy;  Surgeon: [name], MD;  Location: [facility];  Service: Orthopedics;  Laterality: Right;   CARDIAC CATHETERIZATION  2005   30% Cx. Dr. Lavon   cataract surgery Left 11/05/2014   COLONOSCOPY     COLONOSCOPY N/A 01/09/2021   Procedure: COLONOSCOPY;  Surgeon: [name], MD;  Location: [facility];  Service: Gastroenterology;  Laterality: N/A;   ESOPHAGOGASTRODUODENOSCOPY N/A 05/02/2015   Procedure: ESOPHAGOGASTRODUODENOSCOPY (EGD);  Surgeon: [name], MD;  Location: [facility];  Service: Endoscopy;  Laterality: N/A;   ESOPHAGOGASTRODUODENOSCOPY  05/02/2015   no source of pt chest pain endoscopically evident. small hiatal hernia.   ESOPHAGOGASTRODUODENOSCOPY (EGD) WITH PROPOFOL  N/A 01/02/2021   Procedure: ESOPHAGOGASTRODUODENOSCOPY (EGD) WITH PROPOFOL ;  Surgeon: [name], MD;  Location: [facility];  Service: Endoscopy;  Laterality: N/A;   HARDWARE REMOVAL Right 11/02/2019   Procedure: Second Metatarsal Removal of Deep Implant and Rotational Osteotomy;  Surgeon: [name], MD;  Location:  SUR[facility]ERY CENTER;  Service: Orthopedics;  Laterality: Right;   IR ANGIOGRAM SELECTIVE EACH ADDITIONAL VESSEL  01/04/2021  IR ANGIOGRAM SELECTIVE EACH ADDITIONAL VESSEL  01/04/2021   IR ANGIOGRAM VISCERAL SELECTIVE  01/04/2021   IR US  GUIDE VASC ACCESS RIGHT  01/04/2021   LUMBAR LAMINECTOMY/DECOMPRESSION MICRODISCECTOMY N/A 07/03/2021   Procedure: Lumbar three through five decompression with lumbar three through five insitu fusion;  Surgeon: [name], MD;  Location: [facility] OR;  Service: Orthopedics;  Laterality: N/A;   LUMBAR PERCUTANEOUS PEDICLE SCREW 2 LEVEL N/A 12/31/2023   Procedure: LUMBAR PERCUTANEOUS PEDICLE SCREW LUMBAR THREE-LUMBAR FIVE;  Surgeon: [name], MD;  Location: [facility];  Service: Neurosurgery;  Laterality: N/A;   MEMBRANE PEEL Left 03/14/2014   Procedure: MEMBRANE PEEL; ENDOLASER;  Surgeon: [name], MD;  Location: [facility];  Service: Ophthalmology;  Laterality: Left;   NM MYOVIEW  LTD  10/2015   LOW RISK. Small, fixed basal lateral defect - likely diaphragmatic attenuation. EF 69%   PARS PLANA VITRECTOMY Left 03/14/2014   Procedure: PARS PLANA VITRECTOMY WITH 25 GAUGE;  Surgeon: [name], MD;  Location: [facility] OR;  Service: Ophthalmology;  Laterality: Left;   shave  02/03/2022   angiofibroma   TONSILLECTOMY     TRANSTHORACIC ECHOCARDIOGRAM  01/07/2021   Normal EF 60 to 65%.  No R WMA.  GRII DD-moderately dilated LA..  Mildly dilated RV but normal function.  Normal RAP/CVP.  Trivial AI with mild to moderate sclerosis-no stenosis   UMBILICAL HERNIA REPAIR  2023   UPPER GASTROINTESTINAL ENDOSCOPY     WEIL OSTEOTOMY Right 09/01/2017   Procedure: RIGHT GREAT TOE CHEVRON AND WEIL OSTEOTOMY 2ND METATARSAL;  Surgeon: [name], MD;  Location: [facility];  Service: Orthopedics;  Laterality: Right;   XI ROBOTIC ASSISTED VENTRAL HERNIA N/A 03/17/2022   Procedure: XI ROBOTIC ASSISTED VENTRAL HERNIA;  Surgeon: [name], MD;  Location: [facility];  Service: General;  Laterality: N/A;   Patient Active Problem List   Diagnosis Date Noted   Chronic hepatitis C (HCC) 03/09/2024   Malnutrition of moderate degree 01/06/2024   Spondylolisthesis at L4-L5 level 12/31/2023   Supine hypertension 12/03/2023   Parkinson's disease with neurogenic orthostatic hypotension (HCC) 12/03/2023   Preop cardiovascular exam 12/03/2023   Glaucoma 11/30/2023   Retinal vein thrombosis (HCC) 11/30/2023   Acquired hallux rigidus of right foot 11/23/2022   Ventral hernia without obstruction or gangrene    Primary open angle glaucoma of left eye, mild stage 06/23/2021   Facial numbness 12/24/2020   Neovascular glaucoma, right eye, stage unspecified  12/16/2020   Lumbar radiculopathy 11/25/2020   Gastroesophageal reflux disease 09/10/2020   Dry eyes, bilateral 08/15/2020   Presbycusis of right ear 01/17/2020   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) 12/12/2019   Secondary corneal edema of right eye 12/12/2019   Ptosis of right eyelid 12/12/2019   OAB (overactive bladder) 11/29/2019   Bunion of great toe of right foot 07/14/2017   Claw toe, acquired, right 07/14/2017   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   Medication management 10/09/2016   Plantar fasciitis of right foot 02/19/2015   Depression 08/14/2014   Family history of heart disease in male family member before age 24 04/26/2014   Mild cognitive impairment 03/13/2014   Parkinsonian tremor (HCC) 02/12/2014   Hyperlipidemia with target LDL less than 100    Abdominal aortic atherosclerosis    Moderate essential hypertension 02/25/2011   Arthropathy 02/25/2011   HAV (hallux abducto valgus) 02/25/2011    ONSET DATE: 02/08/24 (referral date)  REFERRING DIAG:  G20.A2 (ICD-10-CM) - Parkinson's disease without dyskinesia, with fluctuating manifestations (HCC)  THERAPY DIAG:  Cognitive communication deficit  Rationale for Evaluation and Treatment: Rehabilitation  SUBJECTIVE:   SUBJECTIVE STATEMENT: I have been practicing sometimes. Reports a mix of online and workbook activities.  Pt accompanied by: significant other Bea  PERTINENT HISTORY: recent back surgery (L3-5) on 12/31/23, Parkinson's disease causing autonomic dysregulation and orthostatic hypotension, leading to blood pressure fluctuations and dizziness upon standing. Lumbar radiculopathy (Chronic), PVD, HLD, spinal stenosis, BPH   Post-surgical recovery complicated by inappropriate medication management and inadequate rehabilitation, with delirium post-anesthesia likely exacerbated by Parkinson's disease.     PAIN:  Are you having pain?  No   PATIENT GOALS: To remember what was said                                                                                                                           TREATMENT DATE:   07/18/24: Pt's wife was present during session. Pt's wife notes that she is very concerned about pt's cognitive skills, especially memory and problem solving. SLP performed SLUMS with pt to assess cognitive-communication skills. Pt scored 12/30 on SLUMS. Pt presents with moderate cognitive-communication deficits primarily characterized by short-term and working memory impairment and executive function deficits including impaired problem solving and thought organization. Executive function deficits appeared to be linked with pt's short-term memory impairment, which resulted in pt requiring multiple repetitions of instructions. Pt's wife states that these behaviors are similar to what happens at home. Pt also endorses these statements. 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 SLP encourages pt to continue SPEAK OUT! Exercises for continued practice at home and for cognitive stimulation. Plan is to focus on establishing external memory systems for maximizing pt's independent problem solving for novel situations and conversations.   07/11/24: Pt notes that he is practicing 3-4x/week. He reports that his self-awareness of speaking with intent is improving with consistent practice. SLP provided pt a homework tracking log and challenged pt to practice 5-6x in the upcoming week for improving speech production.  Targeted volume and intelligibility using Speak Out! Lesson 12. Pt required occasional min A verbal cues, modeling, for volume and breath support.   Pt averages the following volume levels:  Sustained AH: 86 dB  Counting: 80 dB  Reading (phrases): 77 dB  Cognitive  Exercise: 73 dB Required occasional min A verbal cues, modeling for carryover of intent /volume answering simple questions following structured practice. SLP reviewed using a memory journal for optimizing pt short-term memory recall. Pt states that he has not acquired a memory journal; however, he as several note-pads he can use at home. SLP encouraged pt to get a memory journal for writing down important information. Pt is agreeable for attempting to use memory journal. Plan is to discuss memory journal usage and integrate speaking with intent into conversational speech along with SPEAK OUT!  Program.   07/04/24: Pt notes that he has not practiced much due to having eye issues.  Targeted volume and intelligibility using Speak Out! Lesson 11. Pt required frequent min A verbal cues, modeling, for volume and breath support.   Pt averages the following volume levels:  Sustained AH: 85 dB  Counting: 83 dB  Reading (phrases): 79 dB  Cognitive Exercise: 76 dB Required frequent min verbal cues, modeling for carryover of intent /volume answering simple questions following structured practice. During cognitive/conversational exercise, pt displayed short-term memory difficulties, forgetting specific instructions for rules. SLP inquired about short-term memory difficulties at home; pt states that he has difficulty with remembering what was said in previous conversations. SLP instructed pt in utilizing a memory journal during conversations to write down important information for later recall. Pt is agreeable about utilizing a memory journal. Pt states that he will place his memory journal on his kitchen table to remember to use it. Plan is to continue SPEAK OUT! Program, conversation-level tx, and further development of cognitive support systems (external memory aids).    06/27/24: Pt reports that he is practicing sometimes since prior session. Pt states that he is realizing that he needs to practice more to  improve speaking with intent.  Targeted volume and intelligibility using Speak Out! Lesson 10. Pt required frequent min verbal cues, modeling, for volume and breath support.   Pt averages the following volume levels:  Sustained AH: 86 dB  Counting: 77 dB  Reading (phrases): 76 dB  Cognitive Exercise: 73 dB Required mod to min verbal cues, modeling for carryover of intent /volume answering simple questions following structured practice. Pt is making progress with dysarthria goals. Pt is working toward improving his compliance with HEP. SLP collaborated with pt on building a HEP program for carry-over of SPEAK OUT! Strategies. Pt plans on practicing at least 20 minutes/day until upcoming session. Pt is making progress with    06/21/24: Garrel reports completing HEP not as much as I should He has had inconsistent schedule of ST sessions - will add 4 more visits to max carryover of HEP and intelligible speech outside of ST.  Targeted volume and intelligibility using Speak Out! Lesson November week 1 from [employer]. Pt required occasional lmin verbal cues, modeling, for volume and breath support.  Pt averages the following volume levels:  Sustained AH: 88dB  Counting:83dB  Reading (phrases): 84dB  Cognitive Exercise: 74dB with frequent mod A Required usual min to mod verbal cues, modeling for carryover of intent /volume in spontaneous conversation. Targeted generating a plan for carryover of HEP consistently   06/07/24:  Targeted volume and intelligibility using Speak Out! Lesson 7. Pt required frequent min A verbal cues, modeling, for volume and breath support.   Pt averages the following volume levels:  Sustained AH: 88 dB  Counting: 80 dB  Reading (phrases): 80 dB  Cognitive Exercise: 75 dB Required frequent min A verbal cues, modeling for carryover of intent /volume answering simple questions following structured practice.    05/24/24: Target improving vocal quality and increasing  intensity through progressively difficulty speech tasks using Speak Out! program, Lesson October week 3 - he averaged 75dB on reading halloween sentences - modified conversation task to divergent naming costumes - he averaged 74dB with rare min A - he required usual min to mod semantic cues to name 18 costumes. Reading 3 pages of his personally relevant phrases with average of 74dB and rare min A - occasional extended time to read phrase  correctly.   05/08/24: Target improving vocal quality and increasing intensity through progressively difficulty speech tasks using Speak Out! program, lesson [place] from [employer] online book. ST leads pt through exercises providing usual model prior to pt execution. occasional mod-A required to achieve target dB this date. Averages this date:  Resonant Vowels: 86 dB  Sustained ah 86 dB -- cues needed to avoid volume decay; unable to mitigate the volume decay with high to low glide  Counting: 81 dB  Reading 76 dB (visual challenges appeared to be a barrier)  Cognitive speech task 78 dB -- no cues needed Conversational sample of approx 10 minutes, pt averages 71 dB with occasional min-A.  05/02/24: Garrel enters with sub WNL volume. Targeted memory of SPEAK OUT! Exercises - with usual mod cues to recall exercises and to read instructions and required reps. He required initial modeling to use intent with Ah to average 88dB, 84dB on glides, 80dB on counting . Generated 6 more personally relevant sentences - Targeted reading and speaking with intent reading 4 words and explaining which does not belong and why - reading averaged 74dB, explanation averaged 70dB with usual min to mod verbal cues and modeling. Homework continues to be to complete an online home practice session. In   04/26/24:  Garrel enters with SPEAK OUT! Booklet - He has completed lessons consistently since receiving the booklet. Today, targeted intelligibility and volume using e Library A Year of  Intent - October 1st week. Garrel required consistent modeling and cueing to complete each exercise correctly and with the most intent. Sustained Ah - 85dB, counting 82dB. Reading - 72dB, conversation exercises - 68dB - Garrel required frequent mod semantic cues and phrase completion cues to name 5 items in given categories and frequent mod verbal cues and modeling to use intent when cognitive load increases.   03/1724: Garrel received his SPEAK OUT! Booklet. They have watched the What is Parkinson's video and will have their children watch it as well.  Targeted volume and intelligibility using Speak Out! Lesson 2. Pt required initial usual mod verbal cues, modeling, for volume and breath support, fading to occasional min A.  Pt averages the following volume levels:  Sustained AH: 84dB  Counting:80dB  Reading (phrases): 75dB  Cognitive Exercise: 72dB with frequent mod A Required  verbal cues, modeling for carryover of intent /volume answering simple questions following structured practice. Generated list of personally relevant phrases to practice with intent, including name, number DOB, address, restaurant orders and script explaining that he has PD and may have trouble speaking- See Patient instructions   04/04/24: Evaluation completed. Introduced [employer] - ordered ANHEUSER-[employer] OUT! Booklet and [employer]. Demonstrated navigating website to locate What is Parkinsons video and home practice sessions. Initiated SPEAK OUT! Lesson 1 to  target volume, intelligibility and speaking with intent as well as cognitive communication exercises. With usual min to mod A, [name] averaged 79dB on sustained Ah, 76dB on glides, 78dB counting and 75dB reading. In conversation/cognitive exercise, with verbal cues and modeling, he averaged 71dB. Instructed them to complete Lesson one again in [employer], as well at to complete home practice sessions on website.    PATIENT EDUCATION: Education details: HEP for  dysarthria, swallow precautions, compensations for slow processing and following conversation Person educated: Patient and Spouse Education method: Programmer, Multimedia, Facilities Manager, Verbal cues, and Handouts Education comprehension: verbal cues required and needs further education  HOME EXERCISE PROGRAM: SPEAK OUT!   GOALS: Goals  reviewed with patient? Yes  SHORT TERM GOALS: Target date: 05/29/24 (extended due to scheduling)  Pt will complete HEP for dysarthria with occasional min A over 2 sessions Baseline: Goal status: MET  2.  Pt will average 74dB 18/20 sentences Baseline:  Goal status: MET  3.  Pt will follow swallow precautions with occasional min A Baseline:  Goal status: MET  4.  Pt and spouse will carryover 2 compensatory strategies to support slow processing  Baseline:  Goal status: MET  5.  Pt will average 72dB over 8 minute conversation Baseline:  Goal status: MET   LONG TERM GOALS: LTG=STGs Target date: 06/27/24  Pt will complete HEP with rare min A over 2 sessions Baseline:  Goal status: ONGOING/NOT MET  2.  Pt will complete 3 online home practice sessions Baseline:  Goal status: ONGOING/NOT MET  3.  Pt will access E Library and complete 3 lessons over 2 weeks with min A from spouse Baseline:  Goal status: ONGOING/NOT MET  4.  Pt will report no episodes of globus sensation with pills Baseline:  Goal status: ONGOING/NOT MET  5.  Pt will average 72dB over 15 minute conversation with rare min A Baseline:  Goal status: ONGOING/NOT MET  6.  Pt and spouse will carry over 4 compensatory strategies to support slow processing in conversation and directions Baseline:  Goal status:ONGOING/NOT MET  ASSESSMENT:  CLINICAL IMPRESSION: Patient is a 73 y.o. 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 He is completing HEP with rare to occasional min A to achieve target dB and maintain  clear, intelligible speech. In conversation, he continues to require frequent min to mod A to carryover intelligible speech into conversation. Spouse reports she continues to have to cue Garrel to speak with intent. Garrel requires ongoing ST to maximize carryover of HEP and to carryover volume and clear phonation outside of ST. I recommend skilled ST to maximize intelligibility, safety of swallow and cognition for safety, to reduce caregiver burden and improve participation in conversations for QOL. UPDATE (07/04/2024): Pt continues to make progress with speech goals. Pt's compliance with HEP has been inconsistent; however, pt continues to improve compliance with HEP. Pt is progressing into conversation-level tx.   OBJECTIVE IMPAIRMENTS: Objective impairments include attention, memory, awareness, executive functioning, dysarthria, and dysphagia. These impairments are limiting patient from managing medications, managing appointments, managing finances, household responsibilities, ADLs/IADLs, effectively communicating at home and in community, and safety when swallowing.Factors affecting potential to achieve goals and functional outcome are ability to learn/carryover information and medical prognosis.. Patient will benefit from skilled SLP services to address above impairments and improve overall function.  REHAB POTENTIAL: Good  PLAN:  SLP FREQUENCY: 1-2x/week  SLP DURATION: 12 weeks  PLANNED INTERVENTIONS: Aspiration precaution training, Pharyngeal strengthening exercises, Diet toleration management , Language facilitation, Environmental controls, Trials of upgraded texture/liquids, Cueing hierachy, Cognitive reorganization, Internal/external aids, Functional tasks, Multimodal communication approach, SLP instruction and feedback, Compensatory strategies, Patient/family education, [phone] Treatment of speech (30 or 45 min) , [id number] Treatment of swallowing function, [phone]- Speech [address] Artic, Phon,  Eval Compre, Express, and MBSS    [employer]ell [employer], CF-SLP 07/18/2024, 2:50 PM      "

## 2024-07-19 ENCOUNTER — Other Ambulatory Visit (HOSPITAL_COMMUNITY): Payer: Self-pay

## 2024-07-21 ENCOUNTER — Other Ambulatory Visit (HOSPITAL_COMMUNITY): Payer: Self-pay

## 2024-07-21 ENCOUNTER — Other Ambulatory Visit: Payer: Self-pay

## 2024-07-22 ENCOUNTER — Other Ambulatory Visit (HOSPITAL_COMMUNITY): Payer: Self-pay

## 2024-07-25 ENCOUNTER — Other Ambulatory Visit (HOSPITAL_COMMUNITY): Payer: Self-pay

## 2024-07-31 NOTE — Telephone Encounter (Signed)
 Pt has appt scheduled with MD tomorrow [name]

## 2024-07-31 NOTE — Telephone Encounter (Signed)
 Can be further discussed at follow up visit scheduled tomorrow.

## 2024-08-01 ENCOUNTER — Encounter: Payer: Self-pay | Admitting: Neurology

## 2024-08-01 ENCOUNTER — Other Ambulatory Visit (HOSPITAL_COMMUNITY): Payer: Self-pay

## 2024-08-01 ENCOUNTER — Ambulatory Visit: Admitting: Neurology

## 2024-08-01 VITALS — BP 106/70 | HR 64 | Ht 70.0 in | Wt 149.0 lb

## 2024-08-01 DIAGNOSIS — G3184 Mild cognitive impairment, so stated: Secondary | ICD-10-CM

## 2024-08-01 DIAGNOSIS — G20A1 Parkinson's disease without dyskinesia, without mention of fluctuations: Secondary | ICD-10-CM

## 2024-08-01 DIAGNOSIS — R413 Other amnesia: Secondary | ICD-10-CM

## 2024-08-01 DIAGNOSIS — K250 Acute gastric ulcer with hemorrhage: Secondary | ICD-10-CM | POA: Insufficient documentation

## 2024-08-01 NOTE — Progress Notes (Signed)
 " [facility] [name]: [name] DOB: 10/20/1950   REASON FOR VISIT: Follow-up for tremor mild cognitive impairment, depression HISTORY FROM: [name]  HISTORY OF PRESENT ILLNESS:HISTORY:63 year African American male who since last year and a half has noticed increasing tremors mainly in the left arm and leg and to a lesser degree in the right arm as well. He states the tremors were quite mild but became more pronounced after a minor accident while [place] . He fell down 12 flights of steps and hit a concrete slab and had a concussion. He and some minor bruises and knee injury which took several months to reck of her period CT scan of the head was done 2 days later which was unremarkable. Since then he has had some walking difficulty but this may be related to his knee pain. He says that his feet do at times gets stuck in his started walking with a stooped posture. He however does not describe typical festination. He has resting and intermittent action tremor claiming the left arm and leg the tremor does improve with activities. The tremor does not seem to interfere with most of his routine. He denies significant bradykinesia, and drooling of saliva or micrographia. He has been evaluated by neurologists Dr. Richardson Cooler at [facility] but he is unable to tell me for the diagnosis was. He was not told that this may be Parkinson's and was not tried on dopaminergic medications. He did have an MRI scan and some lab work but I do not have those results for my review available today. [name] is here today for a second opinion as he feels his tremor is not getting better. He does admit to some light masonry hallucinations as well as restless sleep thrashing of his legs. He denies significant memory or cognitive difficulties. He has not noticed any particular effect of alcohol on his tremor. Is no family history of tremors. [name] does have  chronic hepatitis C and is planning to start treatment with dapsone. He has had no seizures, significant head injury with major loss of consciousness, stroke, TIAs or significant neurological problems.  Update 08/14/2014 : He returns for follow-up after last visit 3 months ago. He feels his tremors are about the same. He had tried reducing the dose of Sinemet  but noticed that his tremors got worse and hence he went back up to the original dose which is 25/100 one tablet 3 times daily. He still feels occasionally confused and at times secondary to some cells but he admits that he worries a lot. He also admits to feeling depressed, getting tired easily and not having initiated. He has not been on any medications for depression. [name] denies significant drooling of saliva, bradykinesia, gait or balance problems. He feels his tremor is mild and does not interfere with his activities of daily living. I discussed alternative treatment options including addition of dopamine agonist, anticholinergic or even consideration for deep brain  stimulation since his tremor is predominantly unilateral however the [name] does not want to consider more aggressive treatment options at the present time. UPDATE 12/26/14 [name], 74 year old black male returns for follow-up. He was last seen by Dr. Rosemarie 08/14/2014. He feels his tremors are better since increasing his carbidopa  levodopa  to 1-1/2 tablets 3 times daily. He is tolerating it well without dizziness, sleepiness, nightmares or hallucinations A MRI Brain with and without Contrast completed on 07/29/2011 was normal.  The Mini-Mental status exam today is  unchanged 29 out of 30. He did not follow-up with his primary care for  complaints of depression. He denies any significant drooling bradykinesia falls or balance problems. His tremor does not interfere with his activities of daily living. He returns for reevaluation Update 07/03/2015 : He returns for follow-up after last  visit 6 months ago. He states his tremors continue to do well and he seems to have responded quite well to increased dose of Sinemet  25/100 1-1/2 tablet 3 times daily. Is tolerating it well without any dizziness, sleepiness, nightmares, hallucinations, nausea or diarrhea. He continues to have mild memory difficulties but his has not been on any treatment for depression and in fact has not seen his primary care physician for the same for quite some time. Update 08/11/2016 PS : He returns for follow-up after last visit 74 year ago. He states his tremors as well as memory loss or more or less unchanged. Continues to have intermittent tremors in the left hand mainly. The tremors fluctuate he has good days and bad days. He is currently taking Sinemet  25/100 one and half tablet 3 times daily. He complains of mild fatigue and daytime sleepiness. He denies hallucinations, delusions, dizziness or upset stomach. He does not want to try higher dose because of fear of side effects. Continues to have short-term memory difficulties. He has not been quite compulsive about taking notes and using memory compensation strategies but plans to do so. He has no new complaints today. UPDATE 07/19/2018CM [name], 74 year old male returns for follow-up with tremors and mild cognitive impairment which he says is stable. He continues to have a resting tremor in the left hand which is intermittent. He continues to have good and bad days. He remains on Sinemet  25 100 (1 and half tablets) 3 times daily. He does little exercise he is not doing any memory compensation strategies at this time and was encouraged to do so. He denies any falls. He denies any hallucinations or increased confusion. He denies any freezing spells. He does complain of left hip pain He returns for reevaluation UPDATE 1/22/2019CM [name], 41 he denies 43-year-old male returns for follow-up with a history of Parkinson's disease.  He continues to have mild resting tremor in  the left hand which is intermittent.  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  He remains on Sinemet  3 times a day however he is taking his medication with a meal.  He complains of sometimes being lightheaded when he stands up too quickly.  He was encouraged to sit on the side of the bed and then get up slightly.  He was also encouraged to stay well-hydrated.  He has not been exercising.  He denies any hallucinations or increased confusion.  He has not had any freezing spells.  He returns for reevaluation 02/15/18 UPDATE:JV  [name] returns today for six-month follow-up.  He continues to take Sinemet  3 times daily with the first dose around 6 AM (1 hour prior to eating breakfast), second dose 11 AM (1 hour prior to eating lunch) and then third dose between 5:30 and 6 PM (eats dinner around 6:30-7 PM).  [name] denies any increase in symptoms between doses but does state if he is late on a dose, he will start having increased tremors in his left upper extremity and left lower extremity.  He continues to complain of short-term memory issue and is unable to state whether it is worsening but he states he notices it more.  He did obtain a  large book full avoid games but does this approximately 1 time per week.  Recommended to do these activities at least once daily.  [name] also has complaints of feeling as though he overanalyzes certain situations and feeling increased fatigue.  [name] does endorse increased stress recently but does feel somewhat situational.  Provided [name] with stress relaxation techniques and advised him if this continues, to follow-up with PCP for possible medication management.  He denies any hallucinations or increased confusion.  He denies any freezing spells. UPDATE 1/29/2020CM [name], 74 year old male returns for follow-up with a history of Parkinson's disease and mild cognitive impairment.  He denies any freezing spells any stiffness any falls.  He denies any difficulty  swallowing.  He has spinal stenosis and had recent injection.  He is seeing Dr. Harden for a foot problem.  He is currently on Sinemet  3 times a day before meals.  He has not been exercising and was encouraged to do so.  He has not had any hallucinations or increased confusion.  Noted very intermittent left tremor of the hand.  [name] reports some anxiety in public.  He was encouraged to perform stress relaxation techniques.  He returns for reevaluation  : Update 02/23/2019 :  he returns for follow-up after last visit 6 months ago.  He states his tremors are doing well until a month ago when he is noticed worsening of his tremor both at rest as well as using his hands.  He remains on Sinemet  25/100 mg 1-1/2 tablet 3 times daily.  Is tolerating them well without any nausea, diarrhea, dizziness, sleepiness or hallucinations.  He states his short-term memory difficulties are unchanged.  He has not been socializing a lot or being active but plans to do that soon.  He has no other complaints.  He denies significant drooling, bradykinesia, stooped posture gait or balance problems Update 09/04/2019 : He returns for follow-up after last visit 6 months ago.  He states that his tremors continue to intermittently not do well.  He has no more bad days than good days.  Has noticed some tremor in his legs as well intermittently.  [name] was advised to try higher dose of Sinemet  2 tablets 3 times daily at last visit.  He states he was not able to tolerate this because of dizziness and had to cut back to 1-1/2 3 times daily which is tolerating better.  He is also noticed that his balance is off but he blames this on having some problems in his toes.  He has had no major falls or injuries.  He can still get up easily from his chair.  He denies significant drooling of saliva.  He does have some at times disturbed sleep and reviewed dreams and talks in his sleep.  Gets startled easily. Update 01/10/2020 : He returns for follow-up  after last visit 4 months ago.  Is accompanied by his wife.  The [name] was started on Azilect  at last visit but had trouble tolerating it daily due to sleepiness, tiredness and dizziness.  He has since switched to using Azilect  0.5 mg twice daily every other day and seems to be tolerating it better.  However his tremors still persists and appear to get worse mostly when he is anxious or with severe exertion.  He feels the tremor are not particularly disabling and he can handle it.  Continues to have poor short-term memory and needs to write things down but still remembers only occasionally.  Sometimes he may remember  later.  Wife notes that occasionally he may pause in midsentence and searches for words.  At times even starts stuttering as he is trying to find the word.  He has no family history of Alzheimer's or dementia.  Still fairly independent and manages most activities for himself.  He remains currently on Sinemet  25/100 1.5 tablets 4 times daily which is tolerating well without significant nightmares, hallucinations, daytime sleepiness or nausea. Update 06/28/2020 : He returns for follow-up after last visit 6 months ago.  He is accompanied by his wife.  He continues to have intermittent tremors mostly in the left hand which are bothersome and he has good days and bad days.  [name] still continues to take Artane  2 mg tablets only half a tablet twice daily on alternate days and not every day as instructed at last visit.  He remains on 25/100 25/100 mg 1.5 tablets 3 times daily and he has had trouble tolerating higher doses in the past.  Is not having significant dizziness or upset stomach but is still having some disturbed sleep with crazy dreams.  He denies hallucinations or delusions.  He is careful with his walking and has had a few near falls but no major falls or injuries.  He denies excessive drooling of saliva or bradykinesia or mobility issues.  He does admit to decreased short-term memory,  attention and concentration difficulties but these appear to be stable and unchanged.  Update 08/01/2024 : She returns for follow-up after last visit with me nearly 5 years ago.  He is accompanied by his wife who feels [name] may have had some cognitive worsening.  Continues to have poor short-term memory.  Takes time to understand things and figure things out.  His wife needs to keep a close watch on him but he is mostly independent in daily living.  He remains on Exelon  4.5 mg twice daily which he is tolerating well without side effects.  Denies any recent headaches, significant head injury, stroke, TIA.  Continues to have intermittent hand tremor difficulties with several near falls but no falls or injuries.  He is taking Sinemet  25/100 1-1/2 tablets 3 times daily and takes 1tab a fourth time.  He is tolerating this well without significant side effects.  He has some occasional dyskinesias of his face but these are not bothersome.  He denies any agitation, delusions and difficulty sleeping. REVIEW OF SYSTEMS: Full 14 system review of systems performed and notable only for those listed, near falls, crazy dreams tremors, imbalance, dizziness and memory difficulties only and all other systems negative ALLERGIES: Allergies  Allergen Reactions   Anesthesia S-I-40 [Propofol ] Other (See Comments)    Delusions when medication is waking up     Aspirin  Other (See Comments)    Pt has had internal bleeding per wife   Fentanyl  Other (See Comments)    Hallucinations and confusion   Haldol  [Haloperidol ]     Haldol  does not work with [name]   Other Other (See Comments)    Severe delirium with anesthesia per spouse enhances parkinsons symptoms    HOME MEDICATIONS: Outpatient Medications Prior to Visit  Medication Sig Dispense Refill   acetaminophen  (TYLENOL ) 500 MG tablet Take 2 tablets (1,000 mg total) by mouth every 6 (six) hours as needed for mild pain.     alfuzosin  (UROXATRAL ) 10 MG 24 hr tablet Take  1 tablet (10 mg total) by mouth daily. 90 tablet 3   atorvastatin  (LIPITOR) 20 MG tablet Take 1 tablet (20 mg total)  by mouth daily. 90 tablet 3   carbidopa -levodopa  (SINEMET  IR) 25-100 MG tablet Take 1.5 tablets by mouth 4 (four) times daily. 540 tablet 3   citalopram  (CELEXA ) 20 MG tablet Take 1 tablet (20 mg total) by mouth at bedtime. 90 tablet 1   dorzolamide -timolol  (COSOPT ) 2-0.5 % ophthalmic solution Place 1 drop into both eyes 2 (two) times daily. 10 mL 11   FIBER ADULT GUMMIES PO Take 1 tablet by mouth 2 (two) times daily.     ibuprofen  (ADVIL ) 600 MG tablet Take 1 tablet (600 mg total) by mouth 3 (three) times daily as needed. 90 tablet 2   midodrine  (PROAMATINE ) 2.5 MG tablet Take 1 tablet (2.5 mg total) by mouth in the morning. 30 tablet 4   Multiple Vitamins-Minerals (MULTIVITAMIN ADULTS 50+ PO) Take 1 tablet by mouth daily.     Netarsudil -Latanoprost  (ROCKLATAN ) 0.02-0.005 % SOLN Place 1 drop into the left eye every night at bedtime. 2.5 mL 3   nitroGLYCERIN  (NITROSTAT ) 0.4 MG SL tablet Place 1 tablet (0.4 mg total) under the tongue every 5 (five) minutes as needed for chest pain. 25 tablet 3   pantoprazole  (PROTONIX ) 40 MG tablet Take 1 tablet (40 mg total) by mouth 2 (two) times daily. 180 tablet 2   rivastigmine  (EXELON ) 4.5 MG capsule Take 1 capsule (4.5 mg total) by mouth 2 (two) times daily. 180 capsule 3   trihexyphenidyl  (ARTANE ) 2 MG tablet Take 0.5 tablets (1 mg total) by mouth 2 (two) times daily. 30 tablet 11   Vibegron  (GEMTESA  PO) Take by mouth.     bacitracin -polymyxin b  (POLYSPORIN ) ophthalmic ointment Apply 0.25 inch to the Right Eye every evening ([name] not taking: Reported on 08/01/2024) 3.5 g 6   dorzolamide -timolol  (COSOPT ) 22.3-6.8 MG/ML ophthalmic solution Instill 1 drop into both eyes twice a day ([name] not taking: Reported on 08/01/2024) 10 mL 11   No facility-administered medications prior to visit.    PAST MEDICAL HISTORY: Past Medical History:   Diagnosis Date   Allergy    seasonal   Anxiety    Arthritis    BPH (benign prostatic hyperplasia)    Complication of anesthesia    Pt. stated he had a reaction that ended in him requiring urinary cath placement; hx delirium worsening parkinson symptoms   Depression    Diverticulosis    GERD (gastroesophageal reflux disease)    esophageal spasms   Glaucoma    Gout    Head injury, closed, with concussion    Hepatitis C    chronic - Has been treated with Harvoni   HLD (hyperlipidemia)    statin intolerant (Crestor  & Simvastatin ) - Taking Livalo  1mg  / week   Hypertension    Neuromuscular disorder (HCC)    Parkinson's Disease   Parkinson's disease (HCC)    Plantar fasciitis    right   PVD (peripheral vascular disease)    With no claudication; only mild abdominal aortic atherosclerosis noted on ultrasound.   Sleep apnea    Wears CPAP nightly   Spinal stenosis of lumbar region    Thoracic ascending aortic aneurysm    4.2 cm ascending TAA 09/2016 CT, 1 yr f/u rec   Ulcer     PAST SURGICAL HISTORY: Past Surgical History:  Procedure Laterality Date   ANTERIOR LAT LUMBAR FUSION N/A 12/31/2023   Procedure: Prone trans-psoas interbody fusion - Lumbar three-Lumbar four - Lumbar four-Lumbar five, right facetectomy Lumbar four-five;  Surgeon: [name], MD;  Location: [facility] OR;  Service: Neurosurgery;  Laterality: N/A;   BUNIONECTOMY WITH WEIL OSTEOTOMY Right 11/02/2019   Procedure: Right Foot Lapidus, Modified McBride Bunionectomy,  Hallux Akin Osteotomy;  Surgeon: Kit Rush, MD;  Location: [facility];  Service: Orthopedics;  Laterality: Right;   CARDIAC CATHETERIZATION  2005   30% Cx. Dr. Lavon   cataract surgery Left 11/05/2014   COLONOSCOPY     COLONOSCOPY N/A 01/09/2021   Procedure: COLONOSCOPY;  Surgeon: [name], MD;  Location: [facility];  Service: Gastroenterology;  Laterality: N/A;   ESOPHAGOGASTRODUODENOSCOPY N/A 05/02/2015   Procedure:  ESOPHAGOGASTRODUODENOSCOPY (EGD);  Surgeon: [name], MD;  Location: [facility];  Service: Endoscopy;  Laterality: N/A;   ESOPHAGOGASTRODUODENOSCOPY  05/02/2015   no source of pt chest pain endoscopically evident. small hiatal hernia.   ESOPHAGOGASTRODUODENOSCOPY (EGD) WITH PROPOFOL  N/A 01/02/2021   Procedure: ESOPHAGOGASTRODUODENOSCOPY (EGD) WITH PROPOFOL ;  Surgeon: [name], MD;  Location: [facility];  Service: Endoscopy;  Laterality: N/A;   HARDWARE REMOVAL Right 11/02/2019   Procedure: Second Metatarsal Removal of Deep Implant and Rotational Osteotomy;  Surgeon: Kit Rush, MD;  Location: [facility];  Service: Orthopedics;  Laterality: Right;   IR ANGIOGRAM SELECTIVE EACH ADDITIONAL VESSEL  01/04/2021   IR ANGIOGRAM SELECTIVE EACH ADDITIONAL VESSEL  01/04/2021   IR ANGIOGRAM VISCERAL SELECTIVE  01/04/2021   IR US  GUIDE VASC ACCESS RIGHT  01/04/2021   LUMBAR LAMINECTOMY/DECOMPRESSION MICRODISCECTOMY N/A 07/03/2021   Procedure: Lumbar three through five decompression with lumbar three through five insitu fusion;  Surgeon: [name], MD;  Location: [facility];  Service: Orthopedics;  Laterality: N/A;   LUMBAR PERCUTANEOUS PEDICLE SCREW 2 LEVEL N/A 12/31/2023   Procedure: LUMBAR PERCUTANEOUS PEDICLE SCREW LUMBAR THREE-LUMBAR FIVE;  Surgeon: [name], MD;  Location: [facility];  Service: Neurosurgery;  Laterality: N/A;   MEMBRANE PEEL Left 03/14/2014   Procedure: MEMBRANE PEEL; ENDOLASER;  Surgeon: [name], MD;  Location: [facility];  Service: Ophthalmology;  Laterality: Left;   NM MYOVIEW  LTD  10/2015   LOW RISK. Small, fixed basal lateral defect - likely diaphragmatic attenuation. 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  No R WMA.  GRII DD-moderately dilated LA..  Mildly dilated RV but normal function.  Normal RAP/CVP.  Trivial AI with mild to moderate sclerosis-no stenosis   UMBILICAL HERNIA REPAIR  2023   UPPER GASTROINTESTINAL ENDOSCOPY     WEIL OSTEOTOMY Right 09/01/2017   Procedure: RIGHT GREAT TOE CHEVRON AND WEIL OSTEOTOMY 2ND METATARSAL;  Surgeon: [name], MD;  Location: [facility];  Service: Orthopedics;  Laterality: Right;   XI ROBOTIC ASSISTED VENTRAL HERNIA N/A 03/17/2022   Procedure: XI ROBOTIC ASSISTED VENTRAL HERNIA;  Surgeon: [name], MD;  Location: [facility];  Service: General;  Laterality: N/A;    FAMILY HISTORY: Family History  Problem Relation Age of Onset   Heart disease Mother    Cancer Sister    Cancer Paternal Grandfather    Sleep apnea Neg Hx     SOCIAL HISTORY: Social History   Socioeconomic History   Marital status: Married    Spouse name: barbra gen   Number of children: 3   Years of education: AS   Highest education level:  Not on file  Occupational History   Occupation: self employed    Employer: [employer]    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  Tobacco Use   Smoking status: Former    Types: Cigars    Quit date: 07/27/1988    Years since quitting: 36.0   Smokeless tobacco: Never  Vaping Use   Vaping status: Never Used  Substance and Sexual Activity   Alcohol use: Not Currently    Alcohol/week: 3.0 standard drinks of alcohol    Types: 1 Glasses of wine, 1 Cans of beer, 1 Shots of liquor per week   Drug use: Not Currently    Types: Codeine    Sexual activity: Yes  Other Topics Concern   Not on file  Social History Narrative   Lives with wife    Right handed   Drinks 1-2 cups caffeine daily   Social Drivers of Health   Tobacco Use: Medium Risk (08/01/2024)   [name] History    Smoking Tobacco Use: Former    Smokeless Tobacco Use: Never    Passive Exposure: Not on Actuary Strain: Low Risk (10/19/2023)    Overall Financial Resource Strain (CARDIA)    Difficulty of Paying Living Expenses: Not hard at all  Food Insecurity: [name] Unable To Answer (12/31/2023)   Hunger Vital Sign    Worried About Running Out of Food in the Last Year: [name] unable to answer    Ran Out of Food in the Last Year: [name] unable to answer  Transportation Needs: [name] Unable To Answer (12/31/2023)   PRAPARE - Transportation    Lack of Transportation (Medical): [name] unable to answer    Lack of Transportation (Non-Medical): [name] unable to answer  Physical Activity: Sufficiently Active (10/19/2023)   Exercise Vital Sign    Days of Exercise per Week: 3 days    Minutes of Exercise per Session: 50 min  Stress: No Stress Concern Present (10/19/2023)   [employer] of Occupational Health - Occupational Stress Questionnaire    Feeling of Stress : Only a little  Social Connections: [name] Unable To Answer (12/31/2023)   Social Connection and Isolation Panel    Frequency of Communication with Friends and Family: [name] unable to answer    Frequency of Social Gatherings with Friends and Family: [name] unable to answer    Attends Religious Services: [name] unable to answer    Active Member of Clubs or Organizations: [name] unable to answer    Attends Banker Meetings: [name] unable to answer    Marital Status: [name] unable to answer  Recent Concern: Social Connections - Moderately Isolated (10/19/2023)   Social Connection and Isolation Panel    Frequency of Communication with Friends and Family: Three times a week    Frequency of Social Gatherings with Friends and Family: More than three times a week    Attends Religious Services: Never    Database Administrator or Organizations: No    Attends Banker Meetings: Never    Marital Status: Married  Catering Manager Violence: [name] Unable To Answer (12/31/2023)   Humiliation, Afraid, Rape, and Kick questionnaire    Fear of Current  or Ex-Partner: [name] unable to answer    Emotionally Abused: [name] unable to answer    Physically Abused: [name] unable to answer    Sexually Abused: [name] unable to answer  Depression (PHQ2-9): Low Risk (10/19/2023)   Depression (PHQ2-9)    PHQ-2 Score: 4  Alcohol Screen: Low Risk (10/19/2023)  Alcohol Screen    Last Alcohol Screening Score (AUDIT): 0  Housing: [name] Unable To Answer (12/31/2023)   Housing Stability Vital Sign    Unable to Pay for Housing in the Last Year: [name] unable to answer    Number of Times Moved in the Last Year: Not on file    Homeless in the Last Year: [name] unable to answer  Utilities: [name] Unable To Answer (12/31/2023)   [facility] Utilities    Threatened with loss of utilities: [name] unable to answer  Health Literacy: Adequate Health Literacy (10/19/2023)   B1300 Health Literacy    Frequency of need for help with medical instructions: Never     PHYSICAL EXAM  Vitals:   08/01/24 1137  BP: 106/70  Pulse: 64  SpO2: 99%  Weight: 149 lb (67.6 kg)  Height: 5' 10 (1.778 m)   Body mass index is 21.38 kg/m.  Generalized: Frail elderly African-American male in no acute distress   Head: normocephalic and atraumatic,.   Neck: Supple,  Musculoskeletal: No deformity right foot boot for recent bunion surgery  Neurological examination   Mentation: Alert. Clock drawing 4/4, able to name 9 animals which walk on 4 legs.  Normal  Recall 3/3.   MMSE 23/30    08/01/2024   11:41 AM 04/12/2024    3:19 PM 04/05/2023    4:14 PM  MMSE - Mini Mental State Exam  Orientation to time 5 5 5   Orientation to Place 5 5 5   Registration 3 3 3   Attention/ Calculation 1 2 5   Recall 1 1 3   Language- name 2 objects 2 2 2   Language- repeat 1 1 1   Language- follow 3 step command 2 3 3   Language- read & follow direction 1 1 1   Write a sentence 1 1 1   Copy design 1 0 1  Total score 23 24 30   Follows all commands speech and language fluent.  Weakly positive glabellar  tap.  Slightly diminished facial expression. Cranial nerve II-XII: Pupils were equal round reactive to light extraocular movements were full, visual field were full on confrontational test. Facial sensation and strength were normal. hearing was intact to finger rubbing bilaterally. Uvula tongue midline. head turning and shoulder shrug were normal and symmetric.Tongue protrusion into cheek strength was normal. Motor: normal bulk and tone, full strength in the BUE, BLE, .  Diminished amplitude of finger tapping on the left with fatigue, intermittent left hand resting tremor, mild cogwheel rigidity upon activation at the left wrist.. Tremor improves with action and intention.  No bradykinesia Sensory: normal and symmetric to light touch, on the face arms and legs  Coordination: finger-nose-finger, heel-to-shin bilaterally, no dysmetria Reflexes: 1+ upper lower and symmetric plantar responses were flexor bilaterally. Gait and Station: Rising up from seated position without assistance, no festination or stooped posture.  Ambulated well in the hall  moderate stride, decreased  arm swing on the left., smooth turning, no assistive device.    DIAGNOSTIC DATA (LABS, IMAGING, TESTING) - I reviewed [name] records, labs, notes, testing and imaging myself where available.  Lab Results  Component Value Date   WBC 11.2 (H) 01/01/2024   HGB 10.9 (L) 01/01/2024   HCT 31.8 (L) 01/01/2024   MCV 89.8 01/01/2024   PLT 174 01/01/2024      Component Value Date/Time   NA 136 12/16/2023 1405   NA 136 01/19/2023 0919   K 4.0 12/16/2023 1405   CL 104 12/16/2023 1405   CO2 25 12/16/2023 1405  GLUCOSE 90 12/16/2023 1405   BUN 24 (H) 12/16/2023 1405   BUN 27 01/19/2023 0919   CREATININE 1.10 12/16/2023 1405   CREATININE 1.06 06/29/2014 0920   CALCIUM  9.6 12/16/2023 1405   PROT 7.3 12/16/2023 1405   PROT 7.2 01/19/2023 0919   ALBUMIN 4.2 12/16/2023 1405   ALBUMIN 4.5 01/19/2023 0919   AST 23 12/16/2023 1405    ALT 13 12/16/2023 1405   ALKPHOS 56 12/16/2023 1405   BILITOT 0.8 12/16/2023 1405   BILITOT 0.3 01/19/2023 0919   GFRNONAA >60 12/16/2023 1405   GFRAA 68 09/10/2020 1058   Lab Results  Component Value Date   CHOL 136 01/19/2023   HDL 55 01/19/2023   LDLCALC 72 01/19/2023   TRIG 37 01/19/2023   CHOLHDL 2.5 01/19/2023     Lab Results  Component Value Date   TSH 3.131 01/11/2021    ASSESSMENT AND PLAN 74 y.o. 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 Good response to Sinemet  but recent worsening of tremors but has been unable to tolerate higher dose due to side effects.. He also complains of mild cognitive impairment which appears to have slightly progressed possibly  in the mild dementia range  PLAN: I had a long discussion with the [name] and his wife regarding his memory loss which likely represents disease related mild cognitive impairment.  I recommend he increase participation in cognitive challenging activities like solving crossword puzzles, playing bridge and sudoku.  Continue Exelon  4.5 mg twice daily.  Check memory panel labs, EEG and MRI scan of the brain.  I encouraged him to do memory compensation strategies.  Continue Sinemet  and the current dosage of 1.5 tablets 3 times daily and 1 tablet once a day as he is tolerating it well.  Continue follow-up with Dr. Buck for sleep apnea management.   I personally spent a total of  45 minutes in the care of the [name] today including getting/reviewing separately obtained history, performing a medically appropriate exam/evaluation, counseling and educating, placing orders, referring and communicating with other health care professionals, documenting clinical information in the EHR, independently interpreting results, and coordinating care.        Eather Popp, MD   [facility] [address], [address] [place], [place] [address] [phone] "

## 2024-08-01 NOTE — Patient Instructions (Addendum)
"   I had a long discussion with the patient and his wife regarding his memory loss which likely represents disease related mild cognitive impairment.  I recommend he increase participation in cognitive challenging activities like solving crossword puzzles, playing bridge and sudoku.  Check memory panel labs, EEG and MRI scan of the brain.  I encouraged him to do memory compensation strategies.  Continue Sinemet  and the current dosage of 1.5 tablets 3 times daily and 1 tablet once a day as he is tolerating it well.  Continue follow-up with Dr. Buck for sleep apnea management.  Memory Compensation Strategies  Use WARM strategy.  W= write it down  A= associate it  R= repeat it  M= make a mental note  2.   You can keep a [employer]lower/designer.  Use a 3-ring notebook with sections for the following: calendar, important names and phone numbers,  medications, doctors' names/phone numbers, lists/reminders, and a section to journal what you did  each day.   3.    Use a calendar to write appointments down.  4.    Write yourself a schedule for the day.  This can be placed on the calendar or in a separate section of the Memory Notebook.  Keeping a  regular schedule can help memory.  5.    Use medication organizer with sections for each day or morning/evening pills.  You may need help loading it  6.    Keep a basket, or pegboard by the door.  Place items that you need to take out with you in the basket or on the pegboard.  You may also want to  include a message board for reminders.  7.    Use sticky notes.  Place sticky notes with reminders in a place where the task is performed.  For example:  turn off the  stove placed by the stove, lock the door placed on the door at eye level,  take your medications on  the bathroom mirror or by the place where you normally take your medications.  8.    Use alarms/timers.  Use while cooking to remind yourself to check on food or as a reminder to take your  medicine, or as a  reminder to make a call, or as a reminder to perform another task, etc.  "

## 2024-08-02 ENCOUNTER — Telehealth: Payer: Self-pay | Admitting: Neurology

## 2024-08-02 NOTE — Telephone Encounter (Signed)
 no auth required sent to [facility] [phone]

## 2024-08-02 NOTE — Telephone Encounter (Signed)
 Request to reschedule appointment Due to a conflict

## 2024-08-03 ENCOUNTER — Other Ambulatory Visit: Admitting: *Deleted

## 2024-08-03 ENCOUNTER — Ambulatory Visit: Payer: Self-pay | Admitting: Neurology

## 2024-08-03 LAB — DEMENTIA PANEL
Homocysteine: 15.2 umol/L (ref 0.0–19.2)
RPR Ser Ql: NONREACTIVE
TSH: 2.11 u[IU]/mL (ref 0.450–4.500)
Vitamin B-12: 1422 pg/mL — ABNORMAL HIGH (ref 232–1245)

## 2024-08-04 ENCOUNTER — Ambulatory Visit: Attending: Adult Health

## 2024-08-04 DIAGNOSIS — R41841 Cognitive communication deficit: Secondary | ICD-10-CM | POA: Insufficient documentation

## 2024-08-04 DIAGNOSIS — R471 Dysarthria and anarthria: Secondary | ICD-10-CM | POA: Insufficient documentation

## 2024-08-04 DIAGNOSIS — R1310 Dysphagia, unspecified: Secondary | ICD-10-CM | POA: Insufficient documentation

## 2024-08-04 NOTE — Patient Instructions (Signed)
 Memory Compensation Strategies  Use WARM strategy. W= write it down A=  associate it R=  repeat it M=  make a mental picture  You can keep a Memory Notebook. Use a 3-ring notebook with sections for the following:  calendar, important names and phone numbers, medications, doctors names/phone numbers, to do list/reminders, and a section to journal what you did each day  Use a calendar to write appointments down.  Write yourself a schedule for the day.  This can be placed on the calendar or in a separate section of the Memory Notebook.  Keeping a regular schedule can help memory.  Use medication organizer with sections for each day or morning/evening pills  You may need help loading it  Keep a basket, or pegboard by the door.   Place items that you need to take out with you in the basket or on the pegboard.  You may also want to include a message board for reminders.  Use sticky notes. Place sticky notes with reminders in a place where the task is performed.  For example:  turn off the stove placed by the stove, lock the door placed on the door at eye level, take your medications on the bathroom mirror or by the place where you normally take your medications  Use alarms, timers, and/or a reminder app. Use while cooking to remind yourself to check on food or as a reminder to take your medicine, or as a reminder to make a call, or as a reminder to perform another task, etc.    Use a voice recorder app or small tape recorder to record important information and notes for yourself. Go back at the end of the day and listen to these.  How to set a new reminder on iPhone:  Search for Reminders on iPhone and click on it.  Hit new reminder (Plus sign on bottom of screen) Type your title. Click on the mini calendar next to the arrow  Click on date and time(Far right choice) It will pull up a calendar Click on the date for the reminder. Click the switch under the word  time Set the time using the wheels.  Then, click the repeat button to choose if you want to repeat the reminder daily.  After you are finished, click apply on the top right of the screen. Then, click add to add the reminder.

## 2024-08-04 NOTE — Therapy (Signed)
 " OUTPATIENT SPEECH LANGUAGE PATHOLOGY PARKINSON'S TREATMENT   Patient Name: [name] MRN: [id number] DOB:[birth year], 74 y.o., male Today's Date: 08/04/2024  PCP: [name], MD REFERRING PROVIDER: [name], NP  END OF SESSION:  End of Session - 08/04/24 1631     Visit Number 14    Number of Visits 18    Date for Recertification  08/29/24    SLP Start Time 1538    SLP Stop Time  1617    SLP Time Calculation (min) 39 min    Activity Tolerance Patient tolerated treatment well               Past Medical History:  Diagnosis Date   Allergy    seasonal   Anxiety    Arthritis    BPH (benign prostatic hyperplasia)    Complication of anesthesia    Pt. stated he had a reaction that ended in him requiring urinary cath placement; hx delirium worsening parkinson symptoms   Depression    Diverticulosis    GERD (gastroesophageal reflux disease)    esophageal spasms   Glaucoma    Gout    Head injury, closed, with concussion    Hepatitis C    chronic - Has been treated with Harvoni   HLD (hyperlipidemia)    statin intolerant (Crestor  & Simvastatin ) - Taking Livalo  1mg  / week   Hypertension    Neuromuscular disorder (HCC)    Parkinson's Disease   Parkinson's disease (HCC)    Plantar fasciitis    right   PVD (peripheral vascular disease)    With no claudication; only mild abdominal aortic atherosclerosis noted on ultrasound.   Sleep apnea    Wears CPAP nightly   Spinal stenosis of lumbar region    Thoracic ascending aortic aneurysm    4.2 cm ascending TAA 09/2016 CT, 1 yr f/u rec   Ulcer    Past Surgical History:  Procedure Laterality Date   ANTERIOR LAT LUMBAR FUSION N/A 12/31/2023   Procedure: Prone trans-psoas interbody fusion - Lumbar three-Lumbar four - Lumbar four-Lumbar five, right facetectomy Lumbar four-five;  Surgeon: [name], MD;  Location: [facility];  Service: Neurosurgery;  Laterality: N/A;   BUNIONECTOMY WITH WEIL OSTEOTOMY Right  11/02/2019   Procedure: Right Foot Lapidus, Modified McBride Bunionectomy,  Hallux Akin Osteotomy;  Surgeon: [name], MD;  Location: [facility];  Service: Orthopedics;  Laterality: Right;   CARDIAC CATHETERIZATION  2005   30% Cx. Dr. Lavon   cataract surgery Left 11/05/2014   COLONOSCOPY     COLONOSCOPY N/A 01/09/2021   Procedure: COLONOSCOPY;  Surgeon: [name], MD;  Location: [facility];  Service: Gastroenterology;  Laterality: N/A;   ESOPHAGOGASTRODUODENOSCOPY N/A 05/02/2015   Procedure: ESOPHAGOGASTRODUODENOSCOPY (EGD);  Surgeon: [name], MD;  Location: [facility];  Service: Endoscopy;  Laterality: N/A;   ESOPHAGOGASTRODUODENOSCOPY  05/02/2015   no source of pt chest pain endoscopically evident. small hiatal hernia.   ESOPHAGOGASTRODUODENOSCOPY (EGD) WITH PROPOFOL  N/A 01/02/2021   Procedure: ESOPHAGOGASTRODUODENOSCOPY (EGD) WITH PROPOFOL ;  Surgeon: [name], MD;  Location: [facility];  Service: Endoscopy;  Laterality: N/A;   HARDWARE REMOVAL Right 11/02/2019   Procedure: Second Metatarsal Removal of Deep Implant and Rotational Osteotomy;  Surgeon: [name], MD;  Location: [facility];  Service: Orthopedics;  Laterality: Right;   IR ANGIOGRAM SELECTIVE EACH ADDITIONAL VESSEL  01/04/2021   IR ANGIOGRAM SELECTIVE EACH ADDITIONAL VESSEL  01/04/2021   IR ANGIOGRAM VISCERAL SELECTIVE  01/04/2021  IR US  GUIDE VASC ACCESS RIGHT  01/04/2021   LUMBAR LAMINECTOMY/DECOMPRESSION MICRODISCECTOMY N/A 07/03/2021   Procedure: Lumbar three through five decompression with lumbar three through five insitu fusion;  Surgeon: [name], MD;  Location: [facility];  Service: Orthopedics;  Laterality: N/A;   LUMBAR PERCUTANEOUS PEDICLE SCREW 2 LEVEL N/A 12/31/2023   Procedure: LUMBAR PERCUTANEOUS PEDICLE SCREW LUMBAR THREE-LUMBAR FIVE;  Surgeon: [name], MD;  Location: [facility];  Service: Neurosurgery;  Laterality: N/A;   MEMBRANE PEEL Left  03/14/2014   Procedure: MEMBRANE PEEL; ENDOLASER;  Surgeon: [name], MD;  Location: [facility];  Service: Ophthalmology;  Laterality: Left;   NM MYOVIEW  LTD  10/2015   LOW RISK. Small, fixed basal lateral defect - likely diaphragmatic attenuation. EF 69%   PARS PLANA VITRECTOMY Left 03/14/2014   Procedure: PARS PLANA VITRECTOMY WITH 25 GAUGE;  Surgeon: [name], MD;  Location: [facility];  Service: Ophthalmology;  Laterality: Left;   shave  02/03/2022   angiofibroma   TONSILLECTOMY     TRANSTHORACIC ECHOCARDIOGRAM  01/07/2021   Normal EF 60 to 65%.  No R WMA.  GRII DD-moderately dilated LA..  Mildly dilated RV but normal function.  Normal RAP/CVP.  Trivial AI with mild to moderate sclerosis-no stenosis   UMBILICAL HERNIA REPAIR  2023   UPPER GASTROINTESTINAL ENDOSCOPY     WEIL OSTEOTOMY Right 09/01/2017   Procedure: RIGHT GREAT TOE CHEVRON AND WEIL OSTEOTOMY 2ND METATARSAL;  Surgeon: [name], MD;  Location: [facility];  Service: Orthopedics;  Laterality: Right;   XI ROBOTIC ASSISTED VENTRAL HERNIA N/A 03/17/2022   Procedure: XI ROBOTIC ASSISTED VENTRAL HERNIA;  Surgeon: [name], MD;  Location: [facility];  Service: General;  Laterality: N/A;   Patient Active Problem List   Diagnosis Date Noted   Acute gastric ulcer with hemorrhage 08/01/2024   Chronic hepatitis C (HCC) 03/09/2024   Malnutrition of moderate degree 01/06/2024   Spondylolisthesis at L4-L5 level 12/31/2023   Supine hypertension 12/03/2023   Parkinson's disease with neurogenic orthostatic hypotension (HCC) 12/03/2023   Preop cardiovascular exam 12/03/2023   Glaucoma 11/30/2023   Retinal vein thrombosis (HCC) 11/30/2023   Acquired hallux rigidus of right foot 11/23/2022   Ventral hernia without obstruction or gangrene    Primary open angle glaucoma of left eye, mild stage 06/23/2021   Facial numbness 12/24/2020   Neovascular glaucoma, right eye, stage unspecified 12/16/2020   Lumbar radiculopathy 11/25/2020    Gastroesophageal reflux disease 09/10/2020   Dry eyes, bilateral 08/15/2020   Presbycusis of right ear 01/17/2020   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) 12/12/2019   Secondary corneal edema of right eye 12/12/2019   Ptosis of right eyelid 12/12/2019   OAB (overactive bladder) 11/29/2019   Bunion of great toe of right foot 07/14/2017   Claw toe, acquired, right 07/14/2017   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   Medication management 10/09/2016   Plantar fasciitis of right foot 02/19/2015   Depression 08/14/2014   Family history of heart disease in male family member before age 61 04/26/2014   Mild cognitive impairment 03/13/2014   Parkinsonian tremor (HCC) 02/12/2014   Hyperlipidemia with target LDL less than 100    Abdominal aortic atherosclerosis    Moderate essential hypertension 02/25/2011   Arthropathy 02/25/2011   HAV (hallux abducto valgus) 02/25/2011    ONSET DATE: 02/08/24 (referral date)  REFERRING DIAG: G20.A2 (ICD-10-CM) - Parkinson's disease without dyskinesia, with fluctuating manifestations (  HCC)  THERAPY DIAG:  Cognitive communication deficit  Rationale for Evaluation and Treatment: Rehabilitation  SUBJECTIVE:   SUBJECTIVE STATEMENT: I have been practicing sometimes. Reports a mix of online and workbook activities.  Pt accompanied by: significant other Bea  PERTINENT HISTORY: recent back surgery (L3-5) on 12/31/23, Parkinson's disease causing autonomic dysregulation and orthostatic hypotension, leading to blood pressure fluctuations and dizziness upon standing. Lumbar radiculopathy (Chronic), PVD, HLD, spinal stenosis, BPH   Post-surgical recovery complicated by inappropriate medication management and inadequate rehabilitation, with delirium post-anesthesia likely exacerbated by Parkinson's disease.     PAIN:  Are you having pain? No   PATIENT GOALS: To remember what was said                                                                                                                            TREATMENT DATE:  08/04/24: SLP introduced more cognitive-communication strategies for supporting short-term memory at home. SLP discussed incorporating dump baskets as catch-all bins for pt's important items to improve short-term memory recall. Pt is agreeable to setting up containers/baskets as external memory strategy. SLP educated pt in using technology as external memory strategy (iPhone reminders). SLP guided pt through setting up reminders to use his memory journal at home more often. Pt successfully completed 12-step process for setting up iPhone reminders given frequent max to mod A. SLP provided sequencing sheet for setting up iPhone reminders for future reference to support cognitive-communication skills. SLP provided pt and pt's wife with instructions for multiple memory compensations to optimize pt's cognitive QOL at home and with ADLs. Plan is to continue building cognitive-communication support systems.   07/18/24: Pt's wife was present during session. Pt's wife notes that she is very concerned about pt's cognitive skills, especially memory and problem solving. SLP performed SLUMS with pt to assess cognitive-communication skills. Pt scored 12/30 on SLUMS. Pt presents with moderate cognitive-communication deficits primarily characterized by short-term and working memory impairment and executive function deficits including impaired problem solving and thought organization. Executive function deficits appeared to be linked with pt's short-term memory impairment, which resulted in pt requiring multiple repetitions of instructions. Pt's wife states that these behaviors are similar to what happens at home. Pt also endorses these statements. 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 SLP encourages pt to continue SPEAK OUT! Exercises for continued practice at home and for cognitive stimulation. Plan is to focus on establishing external memory systems for maximizing pt's independent problem solving for novel situations and conversations.   07/11/24: Pt notes that he is practicing 3-4x/week. He reports that his self-awareness of speaking with intent is improving with consistent practice. SLP provided pt a homework tracking log and challenged pt to practice 5-6x in the upcoming week for improving speech production.  Targeted volume and intelligibility using Speak Out! Lesson 12. Pt required occasional min A verbal cues, modeling, for volume  and breath support.   Pt averages the following volume levels:  Sustained AH: 86 dB  Counting: 80 dB  Reading (phrases): 77 dB  Cognitive Exercise: 73 dB Required occasional min A verbal cues, modeling for carryover of intent /volume answering simple questions following structured practice. SLP reviewed using a memory journal for optimizing pt short-term memory recall. Pt states that he has not acquired a memory journal; however, he as several note-pads he can use at home. SLP encouraged pt to get a memory journal for writing down important information. Pt is agreeable for attempting to use memory journal. Plan is to discuss memory journal usage and integrate speaking with intent into conversational speech along with SPEAK OUT! Program.   07/04/24: Pt notes that he has not practiced much due to having eye issues.  Targeted volume and intelligibility using Speak Out! Lesson 11. Pt required frequent min A verbal cues, modeling, for volume and breath support.   Pt averages the following volume levels:  Sustained AH: 85 dB  Counting: 83 dB  Reading (phrases): 79 dB  Cognitive Exercise: 76 dB Required frequent min verbal cues, modeling for carryover of intent /volume answering simple questions  following structured practice. During cognitive/conversational exercise, pt displayed short-term memory difficulties, forgetting specific instructions for rules. SLP inquired about short-term memory difficulties at home; pt states that he has difficulty with remembering what was said in previous conversations. SLP instructed pt in utilizing a memory journal during conversations to write down important information for later recall. Pt is agreeable about utilizing a memory journal. Pt states that he will place his memory journal on his kitchen table to remember to use it. Plan is to continue SPEAK OUT! Program, conversation-level tx, and further development of cognitive support systems (external memory aids).    06/27/24: Pt reports that he is practicing sometimes since prior session. Pt states that he is realizing that he needs to practice more to improve speaking with intent.  Targeted volume and intelligibility using Speak Out! Lesson 10. Pt required frequent min verbal cues, modeling, for volume and breath support.   Pt averages the following volume levels:  Sustained AH: 86 dB  Counting: 77 dB  Reading (phrases): 76 dB  Cognitive Exercise: 73 dB Required mod to min verbal cues, modeling for carryover of intent /volume answering simple questions following structured practice. Pt is making progress with dysarthria goals. Pt is working toward improving his compliance with HEP. SLP collaborated with pt on building a HEP program for carry-over of SPEAK OUT! Strategies. Pt plans on practicing at least 20 minutes/day until upcoming session. Pt is making progress with    06/21/24: Garrel reports completing HEP not as much as I should He has had inconsistent schedule of ST sessions - will add 4 more visits to max carryover of HEP and intelligible speech outside of ST.  Targeted volume and intelligibility using Speak Out! Lesson November week 1 from [employer]. Pt required occasional lmin verbal cues,  modeling, for volume and breath support.  Pt averages the following volume levels:  Sustained AH: 88dB  Counting:83dB  Reading (phrases): 84dB  Cognitive Exercise: 74dB with frequent mod A Required usual min to mod verbal cues, modeling for carryover of intent /volume in spontaneous conversation. Targeted generating a plan for carryover of HEP consistently   06/07/24:  Targeted volume and intelligibility using Speak Out! Lesson 7. Pt required frequent min A verbal cues, modeling, for volume and breath support.   Pt averages the following volume levels:  Sustained AH: 88 dB  Counting: 80 dB  Reading (phrases): 80 dB  Cognitive Exercise: 75 dB Required frequent min A verbal cues, modeling for carryover of intent /volume answering simple questions following structured practice.    05/24/24: Target improving vocal quality and increasing intensity through progressively difficulty speech tasks using Speak Out! program, Lesson October week 3 - he averaged 75dB on reading halloween sentences - modified conversation task to divergent naming costumes - he averaged 74dB with rare min A - he required usual min to mod semantic cues to name 18 costumes. Reading 3 pages of his personally relevant phrases with average of 74dB and rare min A - occasional extended time to read phrase correctly.   05/08/24: Target improving vocal quality and increasing intensity through progressively difficulty speech tasks using Speak Out! program, lesson [place] from [employer] online book. ST leads pt through exercises providing usual model prior to pt execution. occasional mod-A required to achieve target dB this date. Averages this date:  Resonant Vowels: 86 dB  Sustained ah 86 dB -- cues needed to avoid volume decay; unable to mitigate the volume decay with high to low glide  Counting: 81 dB  Reading 76 dB (visual challenges appeared to be a barrier)  Cognitive speech task 78 dB -- no cues  needed Conversational sample of approx 10 minutes, pt averages 71 dB with occasional min-A.  05/02/24: Garrel enters with sub WNL volume. Targeted memory of SPEAK OUT! Exercises - with usual mod cues to recall exercises and to read instructions and required reps. He required initial modeling to use intent with Ah to average 88dB, 84dB on glides, 80dB on counting . Generated 6 more personally relevant sentences - Targeted reading and speaking with intent reading 4 words and explaining which does not belong and why - reading averaged 74dB, explanation averaged 70dB with usual min to mod verbal cues and modeling. Homework continues to be to complete an online home practice session. In   04/26/24:  Garrel enters with SPEAK OUT! Booklet - He has completed lessons consistently since receiving the booklet. Today, targeted intelligibility and volume using e Library A Year of Intent - October 1st week. Garrel required consistent modeling and cueing to complete each exercise correctly and with the most intent. Sustained Ah - 85dB, counting 82dB. Reading - 72dB, conversation exercises - 68dB - Garrel required frequent mod semantic cues and phrase completion cues to name 5 items in given categories and frequent mod verbal cues and modeling to use intent when cognitive load increases.   03/1724: Garrel received his SPEAK OUT! Booklet. They have watched the What is Parkinson's video and will have their children watch it as well.  Targeted volume and intelligibility using Speak Out! Lesson 2. Pt required initial usual mod verbal cues, modeling, for volume and breath support, fading to occasional min A.  Pt averages the following volume levels:  Sustained AH: 84dB  Counting:80dB  Reading (phrases): 75dB  Cognitive Exercise: 72dB with frequent mod A Required  verbal cues, modeling for carryover of intent /volume answering simple questions following structured practice. Generated list of personally relevant phrases to practice with  intent, including name, number DOB, address, restaurant orders and script explaining that he has PD and may have trouble speaking- See Patient instructions   04/04/24: Evaluation completed. Introduced [employer] - ordered ANHEUSER-BUSCH OUT! Booklet and [employer]. Demonstrated navigating website to locate What is Parkinsons video and home practice sessions. Initiated SPEAK  OUT! Lesson 1 to  target volume, intelligibility and speaking with intent as well as cognitive communication exercises. With usual min to mod A, [name] averaged 79dB on sustained Ah, 76dB on glides, 78dB counting and 75dB reading. In conversation/cognitive exercise, with verbal cues and modeling, he averaged 71dB. Instructed them to complete Lesson one again in [employer], as well at to complete home practice sessions on website.    PATIENT EDUCATION: Education details: HEP for dysarthria, swallow precautions, compensations for slow processing and following conversation Person educated: Patient and Spouse Education method: Programmer, Multimedia, Facilities Manager, Verbal cues, and Handouts Education comprehension: verbal cues required and needs further education  HOME EXERCISE PROGRAM: SPEAK OUT!   GOALS: Goals reviewed with patient? Yes  SHORT TERM GOALS: Target date: 05/29/24 (extended due to scheduling)  Pt will complete HEP for dysarthria with occasional min A over 2 sessions Baseline: Goal status: MET  2.  Pt will average 74dB 18/20 sentences Baseline:  Goal status: MET  3.  Pt will follow swallow precautions with occasional min A Baseline:  Goal status: MET  4.  Pt and spouse will carryover 2 compensatory strategies to support slow processing  Baseline:  Goal status: MET  5.  Pt will average 72dB over 8 minute conversation Baseline:  Goal status: MET   LONG TERM GOALS: LTG=STGs Target date: 06/27/24  Pt will complete HEP with rare min A over 2 sessions Baseline:  Goal status: ONGOING/NOT MET  2.   Pt will complete 3 online home practice sessions Baseline:  Goal status: ONGOING/NOT MET  3.  Pt will access E Library and complete 3 lessons over 2 weeks with min A from spouse Baseline:  Goal status: ONGOING/NOT MET  4.  Pt will report no episodes of globus sensation with pills Baseline:  Goal status: ONGOING/NOT MET  5.  Pt will average 72dB over 15 minute conversation with rare min A Baseline:  Goal status: ONGOING/NOT MET  6.  Pt and spouse will carry over 4 compensatory strategies to support slow processing in conversation and directions Baseline:  Goal status:ONGOING/NOT MET  ASSESSMENT:  CLINICAL IMPRESSION: Patient is a 74 y.o. 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 He is completing HEP with rare to occasional min A to achieve target dB and maintain clear, intelligible speech. In conversation, he continues to require frequent min to mod A to carryover intelligible speech into conversation. Spouse reports she continues to have to cue Garrel to speak with intent. Garrel requires ongoing ST to maximize carryover of HEP and to carryover volume and clear phonation outside of ST. I recommend skilled ST to maximize intelligibility, safety of swallow and cognition for safety, to reduce caregiver burden and improve participation in conversations for QOL. UPDATE (07/04/2024): Pt continues to make progress with speech goals. Pt's compliance with HEP has been inconsistent; however, pt continues to improve compliance with HEP. Pt is progressing into conversation-level tx.   OBJECTIVE IMPAIRMENTS: Objective impairments include attention, memory, awareness, executive functioning, dysarthria, and dysphagia. These impairments are limiting patient from managing medications, managing appointments, managing finances, household responsibilities, ADLs/IADLs, effectively communicating at home and in community, and safety  when swallowing.Factors affecting potential to achieve goals and functional outcome are ability to learn/carryover information and medical prognosis.. Patient will benefit from skilled SLP services to address above impairments and improve overall function.  REHAB POTENTIAL: Good  PLAN:  SLP FREQUENCY: 1-2x/week  SLP DURATION: 12 weeks  PLANNED INTERVENTIONS:  Aspiration precaution training, Pharyngeal strengthening exercises, Diet toleration management , Language facilitation, Environmental controls, Trials of upgraded texture/liquids, Cueing hierachy, Cognitive reorganization, Internal/external aids, Functional tasks, Multimodal communication approach, SLP instruction and feedback, Compensatory strategies, Patient/family education, [phone] Treatment of speech (30 or 45 min) , [id number] Treatment of swallowing function, [phone]- Speech [address] Artic, Phon, Eval Compre, Express, and MBSS    Waddell Music, CF-SLP 08/04/2024, 4:36 PM      "

## 2024-08-07 ENCOUNTER — Other Ambulatory Visit: Payer: Self-pay

## 2024-08-08 ENCOUNTER — Other Ambulatory Visit (HOSPITAL_COMMUNITY): Payer: Self-pay

## 2024-08-08 ENCOUNTER — Other Ambulatory Visit (HOSPITAL_BASED_OUTPATIENT_CLINIC_OR_DEPARTMENT_OTHER): Payer: Self-pay

## 2024-08-08 ENCOUNTER — Other Ambulatory Visit: Payer: Self-pay

## 2024-08-08 MED ORDER — ALFUZOSIN HCL ER 10 MG PO TB24
10.0000 mg | ORAL_TABLET | Freq: Every day | ORAL | 3 refills | Status: AC
  Filled 2024-08-08: qty 30, 30d supply, fill #0

## 2024-08-09 ENCOUNTER — Other Ambulatory Visit (HOSPITAL_COMMUNITY): Payer: Self-pay

## 2024-08-09 ENCOUNTER — Ambulatory Visit: Admitting: Neurology

## 2024-08-09 ENCOUNTER — Other Ambulatory Visit: Payer: Self-pay

## 2024-08-09 DIAGNOSIS — R413 Other amnesia: Secondary | ICD-10-CM

## 2024-08-09 DIAGNOSIS — R4182 Altered mental status, unspecified: Secondary | ICD-10-CM | POA: Diagnosis not present

## 2024-08-09 DIAGNOSIS — G3184 Mild cognitive impairment, so stated: Secondary | ICD-10-CM

## 2024-08-10 ENCOUNTER — Encounter: Payer: Self-pay | Admitting: Neurology

## 2024-08-10 ENCOUNTER — Ambulatory Visit: Admitting: Neurology

## 2024-08-10 VITALS — BP 148/83 | HR 58 | Ht 70.0 in | Wt 150.0 lb

## 2024-08-10 DIAGNOSIS — G4733 Obstructive sleep apnea (adult) (pediatric): Secondary | ICD-10-CM

## 2024-08-10 NOTE — Progress Notes (Addendum)
 Subjective:    Patient ID: [name] is a 74 y.o. male.  HPI    Interim history:   [name] is a 74 year old male with an underlying complex medical history of tremor, parkinsonism, memory loss, allergies, arthritis, reflux disease, glaucoma, gout, hepatitis C, hyperlipidemia, hypertension, plantar fasciitis, peripheral vascular disease, and BPH, who presents for follow-up consultation of his obstructive sleep apnea, on AutoPap therapy.  The patient is accompanied by his wife today.  I first met him at the request of Harlene Bogaert and Dr. Rosemarie on 05/17/2023, at which time he reported snoring and excessive daytime somnolence as well as frequent nighttime urination with sleep disruption.  He was advised to proceed with a sleep study.  His baseline sleep study through our sleep lab on 07/13/2023 showed an AHI of 11.5/h, O2 nadir 86%.  He was encouraged to start home AutoPap therapy.  His set up date was 08/05/2023.  Today, 08/10/2024: I reviewed his AutoPap compliance data from 07/10/2024 through 08/08/2024, which is a total of 30 days, during which time he used his machine 24 days with percent use days greater than 4 hours at 67%, indicating mildly suboptimal compliance with an average use it for days on treatment of 6 hours and 11 minutes, residual AHI borderline at 4.6/h, 95th percentile of pressure at 10.8 cm with a range of 7 to 13 cm with EPR of 2.  95th percentile of leak elevated at 36.5 L/min.  He reports not blowing his AutoPap machine but he is compliant with it for the most part with the help of his wife reminds him and also helps him with getting the distilled water  in the humidifier chamber.  He does have some dry mouth, sometimes he has excessive salivation.  He has tried several different masks and currently is on a large [employer] Vitera fullface mask which works reasonably well and is tolerated reasonably well.   He had seen Harlene Bogaert, NP in March 2025 for sleep apnea  follow-up, at which time he reported difficulty tolerating the fullface mask.  He did not feel that AutoPap therapy was beneficial at the time.  A change in his interface was recommended.   The patient's allergies, current medications, family history, past medical history, past social history, past surgical history and problem list were reviewed and updated as appropriate.   Previously:  05/17/2023: (He) reports snoring and excessive daytime somnolence as well as frequent night time urination as well as sleep disruption.  He has typically no trouble falling asleep but has trouble staying asleep.  He has daytime tiredness.  His Epworth sleepiness score is 6 out of 24, fatigue severity score 24 out of 63.  I reviewed your office note from 04/05/2023.  He was advised to start melatonin for symptoms of REM behavior disorder.  His wife reports that he has a tendency to act out in his dreams.  He endorses vivid dreams.  Dream enactment behavior may have been infrequently, they are unclear of the frequency, wife does not sleep in the same bedroom any longer with him.  She reports that she can hear him talking to sleep.  Bedtime is generally between 11 and 11:30 PM and he typically watches TV in bed and falls asleep with the TV on.  Rise time is around 9.  He goes to the bathroom at night about 3-4 times per average night and also has to let the dog out around 6 AM.  Around 7 AM he will go  back to bed after taking his morning meds including levodopa .  He is not aware of any family history of sleep apnea.  He did try melatonin once or twice, unclear results, unclear why he stopped it.  He drinks caffeine in the form of coffee, usually 1 or 1-1/2 cups in the mornings and 1 can of soda per day.  He does not currently drink any alcohol, he quit smoking 40+ years ago.  His wife is a retired engineer, civil (consulting) of over 40 years.  She is familiar with CPAP therapy.  Patient had a tonsillectomy as a child.  He denies nocturnal or morning  headaches.   His Past Medical History Is Significant For: Past Medical History:  Diagnosis Date   Allergy    seasonal   Anxiety    Arthritis    BPH (benign prostatic hyperplasia)    Complication of anesthesia    Pt. stated he had a reaction that ended in him requiring urinary cath placement; hx delirium worsening parkinson symptoms   Depression    Diverticulosis    GERD (gastroesophageal reflux disease)    esophageal spasms   Glaucoma    Gout    Head injury, closed, with concussion    Hepatitis C    chronic - Has been treated with Harvoni   HLD (hyperlipidemia)    statin intolerant (Crestor  & Simvastatin ) - Taking Livalo  1mg  / week   Hypertension    Neuromuscular disorder (HCC)    Parkinson's Disease   Parkinson's disease (HCC)    Plantar fasciitis    right   PVD (peripheral vascular disease)    With no claudication; only mild abdominal aortic atherosclerosis noted on ultrasound.   Sleep apnea    Wears CPAP nightly   Spinal stenosis of lumbar region    Thoracic ascending aortic aneurysm    4.2 cm ascending TAA 09/2016 CT, 1 yr f/u rec   Ulcer     His Past Surgical History Is Significant For: Past Surgical History:  Procedure Laterality Date   ANTERIOR LAT LUMBAR FUSION N/A 12/31/2023   Procedure: Prone trans-psoas interbody fusion - Lumbar three-Lumbar four - Lumbar four-Lumbar five, right facetectomy Lumbar four-five;  Surgeon: [name], MD;  Location: [facility];  Service: Neurosurgery;  Laterality: N/A;   BUNIONECTOMY WITH WEIL OSTEOTOMY Right 11/02/2019   Procedure: Right Foot Lapidus, Modified McBride Bunionectomy,  Hallux Akin Osteotomy;  Surgeon: Kit Rush, MD;  Location: [facility];  Service: Orthopedics;  Laterality: Right;   CARDIAC CATHETERIZATION  2005   30% Cx. Dr. Lavon   cataract surgery Left 11/05/2014   COLONOSCOPY     COLONOSCOPY N/A 01/09/2021   Procedure: COLONOSCOPY;  Surgeon: [name], MD;  Location: [facility];   Service: Gastroenterology;  Laterality: N/A;   ESOPHAGOGASTRODUODENOSCOPY N/A 05/02/2015   Procedure: ESOPHAGOGASTRODUODENOSCOPY (EGD);  Surgeon: [name], MD;  Location: [facility];  Service: Endoscopy;  Laterality: N/A;   ESOPHAGOGASTRODUODENOSCOPY  05/02/2015   no source of pt chest pain endoscopically evident. small hiatal hernia.   ESOPHAGOGASTRODUODENOSCOPY (EGD) WITH PROPOFOL  N/A 01/02/2021   Procedure: ESOPHAGOGASTRODUODENOSCOPY (EGD) WITH PROPOFOL ;  Surgeon: [name], MD;  Location: [facility];  Service: Endoscopy;  Laterality: N/A;   HARDWARE REMOVAL Right 11/02/2019   Procedure: Second Metatarsal Removal of Deep Implant and Rotational Osteotomy;  Surgeon: Kit Rush, MD;  Location: [facility];  Service: Orthopedics;  Laterality: Right;   IR ANGIOGRAM SELECTIVE EACH ADDITIONAL VESSEL  01/04/2021   IR ANGIOGRAM SELECTIVE EACH ADDITIONAL VESSEL  01/04/2021   IR ANGIOGRAM VISCERAL SELECTIVE  01/04/2021   IR US  GUIDE VASC ACCESS RIGHT  01/04/2021   LUMBAR LAMINECTOMY/DECOMPRESSION MICRODISCECTOMY N/A 07/03/2021   Procedure: Lumbar three through five decompression with lumbar three through five insitu fusion;  Surgeon: [name], MD;  Location: [facility];  Service: Orthopedics;  Laterality: N/A;   LUMBAR PERCUTANEOUS PEDICLE SCREW 2 LEVEL N/A 12/31/2023   Procedure: LUMBAR PERCUTANEOUS PEDICLE SCREW LUMBAR THREE-LUMBAR FIVE;  Surgeon: [name], MD;  Location: [facility];  Service: Neurosurgery;  Laterality: N/A;   MEMBRANE PEEL Left 03/14/2014   Procedure: MEMBRANE PEEL; ENDOLASER;  Surgeon: [name], MD;  Location: [facility];  Service: Ophthalmology;  Laterality: Left;   NM MYOVIEW  LTD  10/2015   LOW RISK. Small, fixed basal lateral defect - likely diaphragmatic attenuation. 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  No R WMA.  GRII DD-moderately dilated LA..  Mildly dilated RV but normal function.  Normal RAP/CVP.  Trivial AI with mild to moderate sclerosis-no stenosis   UMBILICAL HERNIA REPAIR  2023   UPPER GASTROINTESTINAL ENDOSCOPY     WEIL OSTEOTOMY Right 09/01/2017   Procedure: RIGHT GREAT TOE CHEVRON AND WEIL OSTEOTOMY 2ND METATARSAL;  Surgeon: [name], MD;  Location: [facility];  Service: Orthopedics;  Laterality: Right;   XI ROBOTIC ASSISTED VENTRAL HERNIA N/A 03/17/2022   Procedure: XI ROBOTIC ASSISTED VENTRAL HERNIA;  Surgeon: [name], MD;  Location: [facility];  Service: General;  Laterality: N/A;    His Family History Is Significant For: Family History  Problem Relation Age of Onset   Heart disease Mother    Cancer Sister    Cancer Paternal Grandfather    Sleep apnea Neg Hx     His Social History Is Significant For: Social History   Socioeconomic History   Marital status: Married    Spouse name: barbra gen   Number of children: 3   Years of education: AS   Highest education level: Not on file  Occupational History   Occupation: self employed    Associate Professor: [employer]    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  Tobacco Use   Smoking status: Former    Types: Cigars    Quit date: 07/27/1988    Years since quitting: 36.0   Smokeless tobacco: Never  Vaping Use   Vaping status: Never Used  Substance and Sexual Activity   Alcohol use: Not Currently    Alcohol/week: 3.0 standard drinks of alcohol    Types: 1 Glasses of wine, 1 Cans of beer, 1 Shots of liquor per week   Drug use: Not Currently    Types: Codeine    Sexual activity: Yes  Other Topics Concern   Not on file  Social History Narrative   Lives with wife    Right handed   Drinks 1-2 cups caffeine daily   Social Drivers of Health   Tobacco Use: Medium Risk (08/10/2024)   Patient History     Smoking Tobacco Use: Former    Smokeless Tobacco Use: Never    Passive Exposure: Not on file  Financial Resource Strain: Low Risk (10/19/2023)   Overall Financial Resource Strain (CARDIA)    Difficulty of Paying  Living Expenses: Not hard at all  Food Insecurity: Patient Unable To Answer (12/31/2023)   Hunger Vital Sign    Worried About Running Out of Food in the Last Year: Patient unable to answer    Ran Out of Food in the Last Year: Patient unable to answer  Transportation Needs: Patient Unable To Answer (12/31/2023)   PRAPARE - Transportation    Lack of Transportation (Medical): Patient unable to answer    Lack of Transportation (Non-Medical): Patient unable to answer  Physical Activity: Sufficiently Active (10/19/2023)   Exercise Vital Sign    Days of Exercise per Week: 3 days    Minutes of Exercise per Session: 50 min  Stress: No Stress Concern Present (10/19/2023)   [employer] of Occupational Health - Occupational Stress Questionnaire    Feeling of Stress : Only a little  Social Connections: Patient Unable To Answer (12/31/2023)   Social Connection and Isolation Panel    Frequency of Communication with Friends and Family: Patient unable to answer    Frequency of Social Gatherings with Friends and Family: Patient unable to answer    Attends Religious Services: Patient unable to answer    Active Member of Clubs or Organizations: Patient unable to answer    Attends Banker Meetings: Patient unable to answer    Marital Status: Patient unable to answer  Recent Concern: Social Connections - Moderately Isolated (10/19/2023)   Social Connection and Isolation Panel    Frequency of Communication with Friends and Family: Three times a week    Frequency of Social Gatherings with Friends and Family: More than three times a week    Attends Religious Services: Never    Database Administrator or Organizations: No    Attends Banker Meetings: Never    Marital Status:  Married  Depression (PHQ2-9): Low Risk (10/19/2023)   Depression (PHQ2-9)    PHQ-2 Score: 4  Alcohol Screen: Low Risk (10/19/2023)   Alcohol Screen    Last Alcohol Screening Score (AUDIT): 0  Housing: Patient Unable To Answer (12/31/2023)   Housing Stability Vital Sign    Unable to Pay for Housing in the Last Year: Patient unable to answer    Number of Times Moved in the Last Year: Not on file    Homeless in the Last Year: Patient unable to answer  Utilities: Patient Unable To Answer (12/31/2023)   [facility] Utilities    Threatened with loss of utilities: Patient unable to answer  Health Literacy: Adequate Health Literacy (10/19/2023)   B1300 Health Literacy    Frequency of need for help with medical instructions: Never    His Allergies Are:  Allergies[1]:   His Current Medications Are:  Outpatient Encounter Medications as of 08/10/2024  Medication Sig   acetaminophen  (TYLENOL ) 500 MG tablet Take 2 tablets (1,000 mg total) by mouth every 6 (six) hours as needed for mild pain.   alfuzosin  (UROXATRAL ) 10 MG 24 hr tablet Take 1 tablet by mouth once a day.   atorvastatin  (LIPITOR) 20 MG tablet Take 1 tablet (20 mg total) by mouth daily.   bacitracin -polymyxin b  (POLYSPORIN ) ophthalmic ointment Apply 0.25 inch to the Right Eye every evening   carbidopa -levodopa  (SINEMET  IR) 25-100 MG tablet Take 1.5 tablets by mouth 4 (four) times daily.   citalopram  (CELEXA ) 20 MG tablet Take 1 tablet (20 mg total) by mouth at bedtime.   dorzolamide -timolol  (COSOPT ) 2-0.5 % ophthalmic solution Place 1 drop into both eyes 2 (two) times daily.  dorzolamide -timolol  (COSOPT ) 22.3-6.8 MG/ML ophthalmic solution Instill 1 drop into both eyes twice a day   FIBER ADULT GUMMIES PO Take 1 tablet by mouth 2 (two) times daily.   ibuprofen  (ADVIL ) 600 MG tablet Take 1 tablet (600 mg total) by mouth 3 (three) times daily as needed.   midodrine  (PROAMATINE ) 2.5 MG tablet Take 1 tablet (2.5 mg total) by mouth in the morning.    Multiple Vitamins-Minerals (MULTIVITAMIN ADULTS 50+ PO) Take 1 tablet by mouth daily.   Netarsudil -Latanoprost  (ROCKLATAN ) 0.02-0.005 % SOLN Place 1 drop into the left eye every night at bedtime.   nitroGLYCERIN  (NITROSTAT ) 0.4 MG SL tablet Place 1 tablet (0.4 mg total) under the tongue every 5 (five) minutes as needed for chest pain.   pantoprazole  (PROTONIX ) 40 MG tablet Take 1 tablet (40 mg total) by mouth 2 (two) times daily.   rivastigmine  (EXELON ) 4.5 MG capsule Take 1 capsule (4.5 mg total) by mouth 2 (two) times daily.   trihexyphenidyl  (ARTANE ) 2 MG tablet Take 0.5 tablets (1 mg total) by mouth 2 (two) times daily.   Vibegron  (GEMTESA  PO) Take by mouth.   No facility-administered encounter medications on file as of 08/10/2024.  :  Review of Systems:  Out of a complete 14 point review of systems, all are reviewed and negative with the exception of these symptoms as listed below:  Review of Systems  Objective:  Neurological Exam  Physical Exam Physical Examination:   Vitals:   08/10/24 0856  BP: (!) 148/83  Pulse: (!) 58   Of note, blood pressure is borderline elevated but he did not have any symptoms and reports that he has not taken his medicines this morning yet.  General Examination: The patient is a very pleasant 74 y.o. male in no acute distress. He appears somewhat frail, well-groomed.     HEENT: Normocephalic, atraumatic, pupils are equal, round and reactive to light, extraocular tracking is mildly impaired, droopy right eyelid noted.  Hearing aids in place. Face is symmetric with mild to moderate facial masking.  Speech with mild hypophonia.  Airway examination reveals Mallampati class III, mild residual tonsillar tissue on the right.  Redundant soft palate, moderate airway crowding, partial dentures top and bottom.  Mild mouth dryness.   Chest: Clear to auscultation without wheezing, rhonchi or crackles noted.   Heart: S1+S2+0, regular and normal without murmurs,  rubs or gallops noted.    Abdomen: Soft, non-distended.   Extremities: There is no obvious swelling in the distal lower extremities bilaterally.    Skin: Warm and dry without trophic changes noted.    Musculoskeletal: exam reveals no obvious joint deformities.    Neurologically:  Mental status: The patient is awake, alert and oriented in all 4 spheres. His immediate and remote memory, attention, language skills and fund of knowledge are fair.  Wife supplements his history.   Cranial nerves II - XII are as described above under HEENT exam.  Motor exam: Normal bulk, moving all 4 extremities without limitation, intermittent resting tremor in the upper extremities noted, intermittent mild dyskinesias noted in both feet.   Fine motor skills and coordination: Globally mildly impaired.    Cerebellar testing: No dysmetria or intention tremor. There is no truncal or gait ataxia.  Sensory exam: intact to light touch in the upper and lower extremities.   Assessment and Plan:  In summary, [name] is a 74 year old male with an underlying complex medical history of tremor, parkinsonism, memory loss, allergies, arthritis, reflux disease, glaucoma,  gout, hepatitis C, hyperlipidemia, hypertension, plantar fasciitis, peripheral vascular disease, and BPH, who presents for follow-up consultation of his obstructive sleep apnea, on AutoPap therapy since January 2025.  He is compliant with treatment for the most part but does have some difficulty with air leakage, mouth dryness, still have quite a bit of nocturia, between 2-4 times per average night.  Nevertheless, he is willing to continue with treatment.  His wife is very supportive.  She reports that when he had his back surgery he went 1 week in rehab without treatment and she could see a significant cognitive change in him and she is hoping that he will still get get a little bit better cognitively.  Mobility wise he has done well.  He is currently on a  large fullface mask from [employer].  We reviewed his compliance data in detail and also reviewed his sleep apnea diagnosis and benefits that he could reap long-term.  Of note, his baseline sleep study through our sleep lab in December 2024 showed overall mild obstructive sleep apnea but I did recommend treatment based on his medical history and his symptoms at the time.  He is willing to continue with treatment.  He is advised to follow-up routinely in this clinic to see Harlene Bogaert, NP in 1 year for sleep apnea checkup.  He is advised to keep all his other appointments as scheduled/planned.  I answered all their questions today and the patient and his wife are in agreement.  I spent 30 minutes in total face-to-face time and in reviewing records during pre-charting, more than 50% of which was spent in counseling and coordination of care, reviewing test results, reviewing medications and treatment regimen and/or in discussing or reviewing the diagnosis of OSA, the prognosis and treatment options. Pertinent laboratory and imaging test results that were available during this visit with the patient were reviewed by me and considered in my medical decision making (see chart for details).      [1]  Allergies Allergen Reactions   Anesthesia [id number] [Propofol ] Other (See Comments)    Delusions when medication is waking up     Aspirin  Other (See Comments)    Pt has had internal bleeding per wife   Fentanyl  Other (See Comments)    Hallucinations and confusion   Haldol  [Haloperidol ]     Haldol  does not work with patient   Other Other (See Comments)    Severe delirium with anesthesia per spouse enhances parkinsons symptoms

## 2024-08-11 ENCOUNTER — Ambulatory Visit

## 2024-08-12 NOTE — Progress Notes (Unsigned)
 " Cardiology Office Note:    Date:  08/14/2024   ID:  Markale, Birdsell 06/21/1951, MRN [id number]  PCP:  [name], MD   [facility] Providers Cardiologist:  Alm Clay, MD     Referring MD: [name], MD   Chief complaint: 6 month follow up     History of Present Illness:   [name] is a 74 y.o. male with a hx of HTN/orthostatic hypotension with labile blood pressure, HLD (statin intolerant), aortic atherosclerosis, progressive Parkinson's disease with autonomic insufficiency, presenting today for 11-month follow-up.  Cardiac catheterization performed in 2005 for chest discomfort, this showed minor coronary disease.  Negative Myoview  in 2011.  Lexiscan Myoview  2017 with small fixed basal lateral defect, likely artifact, otherwise normal low risk study.  Patient experienced some face tingling in 2022, carotid Doppler in 2022 showing no significant carotid disease.  Echo 2022: LVEF 60-65%, no RWMA, G2 DD, RV mildly enlarged, moderately dilated left atria, no significant valvular disease.  Most recently seen by Dr. Clay in July 2025, ARB was stopped to allow for permissive hypertension and avoid orthostatic hypotension with near syncope.  Presents with his wife, Heron (B), appears stable from a cardiovascular standpoint.  Denies chest pain, shortness of breath, orthopnea, near-syncope, dark/tarry/bloody stools, PND, hematuria, weight changes.  Reports rare palpitations occurring for a few seconds before resolving.  Describes occasional dizziness occurring sporadically, occasionally with positional change, believes this may be related to his Parkinson medication or dehydration.  This occurs maybe once every couple weeks, often improved with drinking water .  Denies any associated chest pain, shortness of breath, palpitations, near-syncope with this dizziness. They are trying to find better ways to increase daily water  intake.  Most recently diagnosed by his  neurologist with OSA, on CPAP.  His wife B reports his neurosymptoms, dizziness, cognition all improved following CPAP use and regular exercise.  Patient describes occasional lower extremity ankle swelling that comes and goes.  Wears compression stockings regularly.  Wife is an [employer] of 45 years.  Blood pressures have been stable while on the midodrine .   ROS:   Please see the history of present illness.    All other systems reviewed and are negative.     Past Medical History:  Diagnosis Date   Allergy    seasonal   Anxiety    Arthritis    BPH (benign prostatic hyperplasia)    Complication of anesthesia    Pt. stated he had a reaction that ended in him requiring urinary cath placement; hx delirium worsening parkinson symptoms   Depression    Diverticulosis    GERD (gastroesophageal reflux disease)    esophageal spasms   Glaucoma    Gout    Head injury, closed, with concussion    Hepatitis C    chronic - Has been treated with Harvoni   HLD (hyperlipidemia)    statin intolerant (Crestor  & Simvastatin ) - Taking Livalo  1mg  / week   Hypertension    Neuromuscular disorder (HCC)    Parkinson's Disease   Parkinson's disease (HCC)    Plantar fasciitis    right   PVD (peripheral vascular disease)    With no claudication; only mild abdominal aortic atherosclerosis noted on ultrasound.   Sleep apnea    Wears CPAP nightly   Spinal stenosis of lumbar region    Thoracic ascending aortic aneurysm    4.2 cm ascending TAA 09/2016 CT, 1 yr f/u rec   Ulcer  Past Surgical History:  Procedure Laterality Date   ANTERIOR LAT LUMBAR FUSION N/A 12/31/2023   Procedure: Prone trans-psoas interbody fusion - Lumbar three-Lumbar four - Lumbar four-Lumbar five, right facetectomy Lumbar four-five;  Surgeon: [name], MD;  Location: [facility];  Service: Neurosurgery;  Laterality: N/A;   BUNIONECTOMY WITH WEIL OSTEOTOMY Right 11/02/2019   Procedure: Right Foot Lapidus, Modified McBride Bunionectomy,   Hallux Akin Osteotomy;  Surgeon: Kit Rush, MD;  Location: [facility];  Service: Orthopedics;  Laterality: Right;   CARDIAC CATHETERIZATION  2005   30% Cx. Dr. Lavon   cataract surgery Left 11/05/2014   COLONOSCOPY     COLONOSCOPY N/A 01/09/2021   Procedure: COLONOSCOPY;  Surgeon: [name], MD;  Location: [facility];  Service: Gastroenterology;  Laterality: N/A;   ESOPHAGOGASTRODUODENOSCOPY N/A 05/02/2015   Procedure: ESOPHAGOGASTRODUODENOSCOPY (EGD);  Surgeon: [name], MD;  Location: [facility];  Service: Endoscopy;  Laterality: N/A;   ESOPHAGOGASTRODUODENOSCOPY  05/02/2015   no source of pt chest pain endoscopically evident. small hiatal hernia.   ESOPHAGOGASTRODUODENOSCOPY (EGD) WITH PROPOFOL  N/A 01/02/2021   Procedure: ESOPHAGOGASTRODUODENOSCOPY (EGD) WITH PROPOFOL ;  Surgeon: [name], MD;  Location: [facility];  Service: Endoscopy;  Laterality: N/A;   HARDWARE REMOVAL Right 11/02/2019   Procedure: Second Metatarsal Removal of Deep Implant and Rotational Osteotomy;  Surgeon: Kit Rush, MD;  Location: [facility];  Service: Orthopedics;  Laterality: Right;   IR ANGIOGRAM SELECTIVE EACH ADDITIONAL VESSEL  01/04/2021   IR ANGIOGRAM SELECTIVE EACH ADDITIONAL VESSEL  01/04/2021   IR ANGIOGRAM VISCERAL SELECTIVE  01/04/2021   IR US  GUIDE VASC ACCESS RIGHT  01/04/2021   LUMBAR LAMINECTOMY/DECOMPRESSION MICRODISCECTOMY N/A 07/03/2021   Procedure: Lumbar three through five decompression with lumbar three through five insitu fusion;  Surgeon: [name], MD;  Location: [facility];  Service: Orthopedics;  Laterality: N/A;   LUMBAR PERCUTANEOUS PEDICLE SCREW 2 LEVEL N/A 12/31/2023   Procedure: LUMBAR PERCUTANEOUS PEDICLE SCREW LUMBAR THREE-LUMBAR FIVE;  Surgeon: [name], MD;  Location: [facility];  Service: Neurosurgery;  Laterality: N/A;   MEMBRANE PEEL Left 03/14/2014   Procedure: MEMBRANE PEEL; ENDOLASER;  Surgeon: [name], MD;   Location: [facility];  Service: Ophthalmology;  Laterality: Left;   NM MYOVIEW  LTD  10/2015   LOW RISK. Small, fixed basal lateral defect - likely diaphragmatic attenuation. 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  No R WMA.  GRII DD-moderately dilated LA..  Mildly dilated RV but normal function.  Normal RAP/CVP.  Trivial AI with mild to moderate sclerosis-no stenosis   UMBILICAL HERNIA REPAIR  2023   UPPER GASTROINTESTINAL ENDOSCOPY     WEIL OSTEOTOMY Right 09/01/2017   Procedure: RIGHT GREAT TOE CHEVRON AND WEIL OSTEOTOMY 2ND METATARSAL;  Surgeon: [name], MD;  Location: [facility];  Service: Orthopedics;  Laterality: Right;   XI ROBOTIC ASSISTED VENTRAL HERNIA N/A 03/17/2022   Procedure: XI ROBOTIC ASSISTED VENTRAL HERNIA;  Surgeon: [name], MD;  Location: [facility];  Service: General;  Laterality: N/A;    Current Medications: Active Medications[1]   Allergies:   Anesthesia s-i-40 [propofol ], Aspirin , Fentanyl , Haldol  [haloperidol ], and Other   Social History   Socioeconomic History  Marital status: Married    Spouse name: barbra gen   Number of children: 3   Years of education: AS   Highest education level: Not on file  Occupational History   Occupation: self employed    Employer: [employer]    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  Tobacco Use   Smoking status: Former    Types: Cigars    Quit date: 07/27/1988    Years since quitting: 36.0   Smokeless tobacco: Never  Vaping Use   Vaping status: Never Used  Substance and Sexual Activity   Alcohol use: Not Currently    Alcohol/week: 3.0 standard drinks of alcohol    Types: 1 Glasses of wine, 1 Cans of beer, 1 Shots of liquor per week   Drug use: Not Currently     Types: Codeine    Sexual activity: Yes  Other Topics Concern   Not on file  Social History Narrative   Lives with wife    Right handed   Drinks 1-2 cups caffeine daily   Social Drivers of Health   Tobacco Use: Medium Risk (08/14/2024)   Patient History    Smoking Tobacco Use: Former    Smokeless Tobacco Use: Never    Passive Exposure: Not on file  Financial Resource Strain: Low Risk (10/19/2023)   Overall Financial Resource Strain (CARDIA)    Difficulty of Paying Living Expenses: Not hard at all  Food Insecurity: Patient Unable To Answer (12/31/2023)   Hunger Vital Sign    Worried About Running Out of Food in the Last Year: Patient unable to answer    Ran Out of Food in the Last Year: Patient unable to answer  Transportation Needs: Patient Unable To Answer (12/31/2023)   PRAPARE - Transportation    Lack of Transportation (Medical): Patient unable to answer    Lack of Transportation (Non-Medical): Patient unable to answer  Physical Activity: Sufficiently Active (10/19/2023)   Exercise Vital Sign    Days of Exercise per Week: 3 days    Minutes of Exercise per Session: 50 min  Stress: No Stress Concern Present (10/19/2023)   [employer] of Occupational Health - Occupational Stress Questionnaire    Feeling of Stress : Only a little  Social Connections: Patient Unable To Answer (12/31/2023)   Social Connection and Isolation Panel    Frequency of Communication with Friends and Family: Patient unable to answer    Frequency of Social Gatherings with Friends and Family: Patient unable to answer    Attends Religious Services: Patient unable to answer    Active Member of Clubs or Organizations: Patient unable to answer    Attends Banker Meetings: Patient unable to answer    Marital Status: Patient unable to answer  Recent Concern: Social Connections - Moderately Isolated (10/19/2023)   Social Connection and Isolation Panel    Frequency of Communication with Friends and  Family: Three times a week    Frequency of Social Gatherings with Friends and Family: More than three times a week    Attends Religious Services: Never    Database Administrator or Organizations: No    Attends Banker Meetings: Never    Marital Status: Married  Depression (PHQ2-9): Low Risk (10/19/2023)   Depression (PHQ2-9)    PHQ-2 Score: 4  Alcohol Screen: Low Risk (10/19/2023)   Alcohol Screen    Last Alcohol Screening Score (AUDIT): 0  Housing: Patient Unable To Answer (12/31/2023)   Housing Stability Vital Sign    Unable to  Pay for Housing in the Last Year: Patient unable to answer    Number of Times Moved in the Last Year: Not on file    Homeless in the Last Year: Patient unable to answer  Utilities: Patient Unable To Answer (12/31/2023)   AHC Utilities    Threatened with loss of utilities: Patient unable to answer  Health Literacy: Adequate Health Literacy (10/19/2023)   B1300 Health Literacy    Frequency of need for help with medical instructions: Never     Family History: The patient's family history includes Cancer in his paternal grandfather and sister; Heart disease in his mother. There is no history of Sleep apnea.  EKGs/Labs/Other Studies Reviewed:    The following studies were reviewed today:       Recent Labs: 12/16/2023: ALT 13; BUN 24; Creatinine, Ser 1.10; Potassium 4.0; Sodium 136 01/01/2024: Hemoglobin 10.9; Platelets 174 08/01/2024: TSH 2.110  Recent Lipid Panel    Component Value Date/Time   CHOL 136 01/19/2023 0919   TRIG 37 01/19/2023 0919   HDL 55 01/19/2023 0919   CHOLHDL 2.5 01/19/2023 0919   CHOLHDL 2.2 01/07/2021 0245   VLDL 7 01/07/2021 0245   LDLCALC 72 01/19/2023 0919     Risk Assessment/Calculations:                Physical Exam:    VS:  BP 112/60   Pulse (!) 59   Ht 5' 10 (1.778 m)   Wt 151 lb (68.5 kg)   SpO2 97%   BMI 21.67 kg/m        Wt Readings from Last 3 Encounters:  08/14/24 151 lb (68.5 kg)  08/10/24  150 lb (68 kg)  08/01/24 149 lb (67.6 kg)     GEN:  Well nourished, well developed in no acute distress HEENT: Normal NECK: No JVD; No carotid bruits CARDIAC:  S1-S2 normal, RRR, no murmurs, rubs, gallops RESPIRATORY:  Clear to auscultation without rales, wheezing or rhonchi  MUSCULOSKELETAL:  No edema; No deformity  SKIN: Warm and dry NEUROLOGIC:  Alert and oriented x 3 PSYCHIATRIC:  Normal affect       Assessment & Plan Dizziness Palpitations Reports occasional dizziness occurring a couple times a month, no associated chest pain, shortness of breath, palpitations, near-syncope.  Lasting for short period of time and resolving spontaneously or with increased fluid intake.  Patient and family believe symptoms improved with increased fluid intake, CPAP use, regular exercise.  Offered ZIO monitoring and echo, patient preferred to wait and watch given symptoms are well-controlled at this time.  They will reach out if symptoms change or worsen and proceed with testing at that time. Parkinson's disease with neurogenic orthostatic hypotension (HCC) Primary hypertension BPs have been well-controlled, typically averaging in the low 100s/60s while on midodrine .  Occasional dizziness as above, denies lightheadedness or near syncope. Continue to increase hydration and take positional changes slowly. Continue compression stocking use. Continue midodrine  2.5 mg daily Hyperlipidemia with target LDL less than 100 Lipid panel 12/2022: Cholesterol 136, HDL 55, LDL 72, triglycerides 37 LFTs 12/16/2023: AST 23, ALT 13 Patient did eat breakfast this morning prior to arrival, would prefer to update lipid panel at follow-up visit.  No labs ordered today  Continue atorvastatin  20 mg daily OSA (obstructive sleep apnea) Diagnosed with OSA by neurology Reports compliance with nightly CPAP use  Disposition: Follow-up with your cardiologist in 4 to 6 months, or sooner if needed.  Proceed to the ED with any new  or worsening  symptoms.            Medication Adjustments/Labs and Tests Ordered: Current medicines are reviewed at length with the patient today.  Concerns regarding medicines are outlined above.  No orders of the defined types were placed in this encounter.  No orders of the defined types were placed in this encounter.   Patient Instructions  Medication Instructions:   Your physician recommends that you continue on your current medications as directed. Please refer to the Current Medication list given to you today.   *If you need a refill on your cardiac medications before your next appointment, please call your pharmacy*    Lab Work: NONE ORDERED  TODAY     If you have labs (blood work) drawn today and your tests are completely normal, you will receive your results only by: MyChart Message (if you have MyChart) OR A paper copy in the mail If you have any lab test that is abnormal or we need to change your treatment, we will call you to review the results.    Testing/Procedures: NONE ORDERED  TODAY     Follow-Up: At [facility], you and your health needs are our priority.  As part of our continuing mission to provide you with exceptional heart care, our providers are all part of one team.  This team includes your primary Cardiologist (physician) and Advanced Practice Providers or APPs (Physician Assistants and Nurse Practitioners) who all work together to provide you with the care you need, when you need it.  Your next appointment:    4 -6  month(s)  Provider:    Alm Clay, MD    We recommend signing up for the patient portal called MyChart.  Sign up information is provided on this After Visit Summary.  MyChart is used to connect with patients for Virtual Visits (Telemedicine).  Patients are able to view lab/test results, encounter notes, upcoming appointments, etc.  Non-urgent messages can be sent to your provider as well.   To learn more about what  you can do with MyChart, go to [email or web address].    Other Instructions       Signed, Miriam FORBES Shams, NP  08/14/2024 11:50 AM    [facility]     [1]  Current Meds  Medication Sig   acetaminophen  (TYLENOL ) 500 MG tablet Take 2 tablets (1,000 mg total) by mouth every 6 (six) hours as needed for mild pain.   alfuzosin  (UROXATRAL ) 10 MG 24 hr tablet Take 1 tablet by mouth once a day.   atorvastatin  (LIPITOR) 20 MG tablet Take 1 tablet (20 mg total) by mouth daily.   bacitracin -polymyxin b  (POLYSPORIN ) ophthalmic ointment Apply 0.25 inch to the Right Eye every evening   carbidopa -levodopa  (SINEMET  IR) 25-100 MG tablet Take 1.5 tablets by mouth 4 (four) times daily.   citalopram  (CELEXA ) 20 MG tablet Take 1 tablet (20 mg total) by mouth at bedtime.   dorzolamide -timolol  (COSOPT ) 2-0.5 % ophthalmic solution Place 1 drop into both eyes 2 (two) times daily.   dorzolamide -timolol  (COSOPT ) 22.3-6.8 MG/ML ophthalmic solution Instill 1 drop into both eyes twice a day   FIBER ADULT GUMMIES PO Take 1 tablet by mouth 2 (two) times daily.   ibuprofen  (ADVIL ) 600 MG tablet Take 1 tablet (600 mg total) by mouth 3 (three) times daily as needed.   midodrine  (PROAMATINE ) 2.5 MG tablet Take 1 tablet (2.5 mg total) by mouth in the morning.   Multiple Vitamins-Minerals (MULTIVITAMIN ADULTS 50+ PO) Take 1  tablet by mouth daily.   Netarsudil -Latanoprost  (ROCKLATAN ) 0.02-0.005 % SOLN Place 1 drop into the left eye every night at bedtime.   nitroGLYCERIN  (NITROSTAT ) 0.4 MG SL tablet Place 1 tablet (0.4 mg total) under the tongue every 5 (five) minutes as needed for chest pain.   pantoprazole  (PROTONIX ) 40 MG tablet Take 1 tablet (40 mg total) by mouth 2 (two) times daily.   rivastigmine  (EXELON ) 4.5 MG capsule Take 1 capsule (4.5 mg total) by mouth 2 (two) times daily.   trihexyphenidyl  (ARTANE ) 2 MG tablet Take 0.5 tablets (1 mg total) by mouth 2 (two) times daily.   Vibegron  (GEMTESA  PO)  Take by mouth.   "

## 2024-08-14 ENCOUNTER — Ambulatory Visit: Attending: Emergency Medicine | Admitting: Emergency Medicine

## 2024-08-14 ENCOUNTER — Encounter: Payer: Self-pay | Admitting: Emergency Medicine

## 2024-08-14 VITALS — BP 112/60 | HR 59 | Ht 70.0 in | Wt 151.0 lb

## 2024-08-14 DIAGNOSIS — G903 Multi-system degeneration of the autonomic nervous system: Secondary | ICD-10-CM

## 2024-08-14 DIAGNOSIS — G4733 Obstructive sleep apnea (adult) (pediatric): Secondary | ICD-10-CM

## 2024-08-14 DIAGNOSIS — I10 Essential (primary) hypertension: Secondary | ICD-10-CM | POA: Diagnosis not present

## 2024-08-14 DIAGNOSIS — E785 Hyperlipidemia, unspecified: Secondary | ICD-10-CM

## 2024-08-14 DIAGNOSIS — R002 Palpitations: Secondary | ICD-10-CM | POA: Diagnosis not present

## 2024-08-14 DIAGNOSIS — R42 Dizziness and giddiness: Secondary | ICD-10-CM | POA: Diagnosis not present

## 2024-08-14 NOTE — Patient Instructions (Signed)
 Medication Instructions:   Your physician recommends that you continue on your current medications as directed. Please refer to the Current Medication list given to you today.   *If you need a refill on your cardiac medications before your next appointment, please call your pharmacy*    Lab Work: NONE ORDERED  TODAY     If you have labs (blood work) drawn today and your tests are completely normal, you will receive your results only by: MyChart Message (if you have MyChart) OR A paper copy in the mail If you have any lab test that is abnormal or we need to change your treatment, we will call you to review the results.    Testing/Procedures: NONE ORDERED  TODAY     Follow-Up: At [facility], you and your health needs are our priority.  As part of our continuing mission to provide you with exceptional heart care, our providers are all part of one team.  This team includes your primary Cardiologist (physician) and Advanced Practice Providers or APPs (Physician Assistants and Nurse Practitioners) who all work together to provide you with the care you need, when you need it.  Your next appointment:    4 -6  month(s)  Provider:    Alm Clay, MD    We recommend signing up for the patient portal called MyChart.  Sign up information is provided on this After Visit Summary.  MyChart is used to connect with patients for Virtual Visits (Telemedicine).  Patients are able to view lab/test results, encounter notes, upcoming appointments, etc.  Non-urgent messages can be sent to your provider as well.   To learn more about what you can do with MyChart, go to [email or web address].    Other Instructions

## 2024-08-14 NOTE — Assessment & Plan Note (Signed)
 Lipid panel 12/2022: Cholesterol 136, HDL 55, LDL 72, triglycerides 37 LFTs 12/16/2023: AST 23, ALT 13 Patient did eat breakfast this morning prior to arrival, would prefer to update lipid panel at follow-up visit.  No labs ordered today  Continue atorvastatin  20 mg daily

## 2024-08-14 NOTE — Assessment & Plan Note (Signed)
 BPs have been well-controlled, typically averaging in the low 100s/60s while on midodrine .  Occasional dizziness as above, denies lightheadedness or near syncope. Continue to increase hydration and take positional changes slowly. Continue compression stocking use. Continue midodrine  2.5 mg daily

## 2024-08-17 ENCOUNTER — Telehealth: Payer: Self-pay | Admitting: Family Medicine

## 2024-08-17 DIAGNOSIS — H401121 Primary open-angle glaucoma, left eye, mild stage: Secondary | ICD-10-CM

## 2024-08-17 DIAGNOSIS — H18231 Secondary corneal edema, right eye: Secondary | ICD-10-CM

## 2024-08-17 NOTE — Telephone Encounter (Signed)
 Copied from CRM #[id number]. Topic: Referral - Question >> Aug 17, 2024  9:11 AM Myrick T wrote: Reason for CRM: Rosaline called stated referral is needed for patient. Info that needs to be input into the [facility] portal is provider: Arley Ruder WEP#[id number]; Tax PI#[id number]; [address] [place] [address]; Diagnosis code: [id number].

## 2024-08-18 ENCOUNTER — Ambulatory Visit

## 2024-08-20 ENCOUNTER — Other Ambulatory Visit (HOSPITAL_COMMUNITY): Payer: Self-pay

## 2024-08-21 ENCOUNTER — Other Ambulatory Visit (HOSPITAL_COMMUNITY): Payer: Self-pay

## 2024-08-22 ENCOUNTER — Other Ambulatory Visit: Payer: Self-pay

## 2024-08-22 ENCOUNTER — Other Ambulatory Visit (HOSPITAL_COMMUNITY): Payer: Self-pay

## 2024-08-23 ENCOUNTER — Encounter: Payer: Self-pay | Admitting: Diagnostic Neuroimaging

## 2024-08-25 ENCOUNTER — Ambulatory Visit

## 2024-08-25 ENCOUNTER — Telehealth: Payer: Self-pay | Admitting: Diagnostic Neuroimaging

## 2024-08-25 DIAGNOSIS — R1310 Dysphagia, unspecified: Secondary | ICD-10-CM

## 2024-08-25 DIAGNOSIS — R41841 Cognitive communication deficit: Secondary | ICD-10-CM | POA: Diagnosis not present

## 2024-08-25 DIAGNOSIS — R471 Dysarthria and anarthria: Secondary | ICD-10-CM

## 2024-08-25 NOTE — Telephone Encounter (Signed)
 Patient's wife called on 08/23/2024 at 10 PM.  Patient accidentally took 3 tablets of carbidopa  levodopa  instead of 1.5 tablets.  Patient was stable 2 hours after this dose.  Advised patient and patient's wife to monitor symptoms.  Reassured them and advised to go to the emergency room if any serious symptoms should occur over the next 4 to 6 hours.  Otherwise may resume normal dose in the next day.  EDUARD FABIENE HANLON, MD 08/25/2024, 12:16 PM Certified in Neurology, Neurophysiology and Neuroimaging  [facility] [address] [place], [place] [address] [phone]

## 2024-08-25 NOTE — Therapy (Signed)
 " OUTPATIENT SPEECH LANGUAGE PATHOLOGY PARKINSON'S TREATMENT/RECERTIFICATION   Patient Name: [name] MRN: [id number] DOB:January 22, 1951, 74 y.o., male Today's Date: 08/25/2024  PCP: [name], MD REFERRING PROVIDER: [name], NP  END OF SESSION:  End of Session - 08/25/24 1621     Visit Number 15    Number of Visits 18    Date for Recertification  09/29/24    SLP Start Time 1450    SLP Stop Time  1530    SLP Time Calculation (min) 40 min    Activity Tolerance Patient tolerated treatment well                Past Medical History:  Diagnosis Date   Allergy    seasonal   Anxiety    Arthritis    BPH (benign prostatic hyperplasia)    Complication of anesthesia    Pt. stated he had a reaction that ended in him requiring urinary cath placement; hx delirium worsening parkinson symptoms   Depression    Diverticulosis    GERD (gastroesophageal reflux disease)    esophageal spasms   Glaucoma    Gout    Head injury, closed, with concussion    Hepatitis C    chronic - Has been treated with Harvoni   HLD (hyperlipidemia)    statin intolerant (Crestor  & Simvastatin ) - Taking Livalo  1mg  / week   Hypertension    Neuromuscular disorder (HCC)    Parkinson's Disease   Parkinson's disease (HCC)    Plantar fasciitis    right   PVD (peripheral vascular disease)    With no claudication; only mild abdominal aortic atherosclerosis noted on ultrasound.   Sleep apnea    Wears CPAP nightly   Spinal stenosis of lumbar region    Thoracic ascending aortic aneurysm    4.2 cm ascending TAA 09/2016 CT, 1 yr f/u rec   Ulcer    Past Surgical History:  Procedure Laterality Date   ANTERIOR LAT LUMBAR FUSION N/A 12/31/2023   Procedure: Prone trans-psoas interbody fusion - Lumbar three-Lumbar four - Lumbar four-Lumbar five, right facetectomy Lumbar four-five;  Surgeon: [name], MD;  Location: [facility];  Service: Neurosurgery;  Laterality: N/A;   BUNIONECTOMY WITH WEIL  OSTEOTOMY Right 11/02/2019   Procedure: Right Foot Lapidus, Modified McBride Bunionectomy,  Hallux Akin Osteotomy;  Surgeon: [name], MD;  Location: [facility];  Service: Orthopedics;  Laterality: Right;   CARDIAC CATHETERIZATION  2005   30% Cx. Dr. Lavon   cataract surgery Left 11/05/2014   COLONOSCOPY     COLONOSCOPY N/A 01/09/2021   Procedure: COLONOSCOPY;  Surgeon: [name], MD;  Location: [facility];  Service: Gastroenterology;  Laterality: N/A;   ESOPHAGOGASTRODUODENOSCOPY N/A 05/02/2015   Procedure: ESOPHAGOGASTRODUODENOSCOPY (EGD);  Surgeon: [name], MD;  Location: [facility];  Service: Endoscopy;  Laterality: N/A;   ESOPHAGOGASTRODUODENOSCOPY  05/02/2015   no source of pt chest pain endoscopically evident. small hiatal hernia.   ESOPHAGOGASTRODUODENOSCOPY (EGD) WITH PROPOFOL  N/A 01/02/2021   Procedure: ESOPHAGOGASTRODUODENOSCOPY (EGD) WITH PROPOFOL ;  Surgeon: [name], MD;  Location: [facility];  Service: Endoscopy;  Laterality: N/A;   HARDWARE REMOVAL Right 11/02/2019   Procedure: Second Metatarsal Removal of Deep Implant and Rotational Osteotomy;  Surgeon: [name], MD;  Location: [facility];  Service: Orthopedics;  Laterality: Right;   IR ANGIOGRAM SELECTIVE EACH ADDITIONAL VESSEL  01/04/2021   IR ANGIOGRAM SELECTIVE EACH ADDITIONAL VESSEL  01/04/2021   IR ANGIOGRAM VISCERAL SELECTIVE  01/04/2021  IR US  GUIDE VASC ACCESS RIGHT  01/04/2021   LUMBAR LAMINECTOMY/DECOMPRESSION MICRODISCECTOMY N/A 07/03/2021   Procedure: Lumbar three through five decompression with lumbar three through five insitu fusion;  Surgeon: [name], MD;  Location: [facility];  Service: Orthopedics;  Laterality: N/A;   LUMBAR PERCUTANEOUS PEDICLE SCREW 2 LEVEL N/A 12/31/2023   Procedure: LUMBAR PERCUTANEOUS PEDICLE SCREW LUMBAR THREE-LUMBAR FIVE;  Surgeon: [name], MD;  Location: [facility];  Service: Neurosurgery;  Laterality: N/A;   MEMBRANE  PEEL Left 03/14/2014   Procedure: MEMBRANE PEEL; ENDOLASER;  Surgeon: [name], MD;  Location: [facility];  Service: Ophthalmology;  Laterality: Left;   NM MYOVIEW  LTD  10/2015   LOW RISK. Small, fixed basal lateral defect - likely diaphragmatic attenuation. EF 69%   PARS PLANA VITRECTOMY Left 03/14/2014   Procedure: PARS PLANA VITRECTOMY WITH 25 GAUGE;  Surgeon: [name], MD;  Location: [facility] OR;  Service: Ophthalmology;  Laterality: Left;   shave  02/03/2022   angiofibroma   TONSILLECTOMY     TRANSTHORACIC ECHOCARDIOGRAM  01/07/2021   Normal EF 60 to 65%.  No R WMA.  GRII DD-moderately dilated LA..  Mildly dilated RV but normal function.  Normal RAP/CVP.  Trivial AI with mild to moderate sclerosis-no stenosis   UMBILICAL HERNIA REPAIR  2023   UPPER GASTROINTESTINAL ENDOSCOPY     WEIL OSTEOTOMY Right 09/01/2017   Procedure: RIGHT GREAT TOE CHEVRON AND WEIL OSTEOTOMY 2ND METATARSAL;  Surgeon: [name], MD;  Location: [facility];  Service: Orthopedics;  Laterality: Right;   XI ROBOTIC ASSISTED VENTRAL HERNIA N/A 03/17/2022   Procedure: XI ROBOTIC ASSISTED VENTRAL HERNIA;  Surgeon: [name], MD;  Location: [facility];  Service: General;  Laterality: N/A;   Patient Active Problem List   Diagnosis Date Noted   Acute gastric ulcer with hemorrhage 08/01/2024   Chronic hepatitis C (HCC) 03/09/2024   Malnutrition of moderate degree 01/06/2024   Spondylolisthesis at L4-L5 level 12/31/2023   Supine hypertension 12/03/2023   Parkinson's disease with neurogenic orthostatic hypotension (HCC) 12/03/2023   Preop cardiovascular exam 12/03/2023   Glaucoma 11/30/2023   Retinal vein thrombosis (HCC) 11/30/2023   Acquired hallux rigidus of right foot 11/23/2022   Ventral hernia without obstruction or gangrene    Primary open angle glaucoma of left eye, mild stage 06/23/2021   Facial numbness 12/24/2020   Neovascular glaucoma, right eye, stage unspecified 12/16/2020   Lumbar radiculopathy  11/25/2020   Gastroesophageal reflux disease 09/10/2020   Dry eyes, bilateral 08/15/2020   Presbycusis of right ear 01/17/2020   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) 12/12/2019   Secondary corneal edema of right eye 12/12/2019   Ptosis of right eyelid 12/12/2019   OAB (overactive bladder) 11/29/2019   Bunion of great toe of right foot 07/14/2017   Claw toe, acquired, right 07/14/2017   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   Medication management 10/09/2016   Plantar fasciitis of right foot 02/19/2015   Depression 08/14/2014   Family history of heart disease in male family member before age 48 04/26/2014   Mild cognitive impairment 03/13/2014   Parkinsonian tremor (HCC) 02/12/2014   Hyperlipidemia with target LDL less than 100    Abdominal aortic atherosclerosis    Moderate essential hypertension 02/25/2011   Arthropathy 02/25/2011   HAV (hallux abducto valgus) 02/25/2011    ONSET DATE: 02/08/24 (referral date)  REFERRING DIAG: G20.A2 (ICD-10-CM) - Parkinson's disease without dyskinesia, with fluctuating manifestations (  HCC)  THERAPY DIAG:  Cognitive communication deficit  Dysphagia, unspecified type  Rationale for Evaluation and Treatment: Rehabilitation  SUBJECTIVE:   SUBJECTIVE STATEMENT: I have been practicing sometimes. Reports a mix of online and workbook activities.  Pt accompanied by: significant other Bea  PERTINENT HISTORY: recent back surgery (L3-5) on 12/31/23, Parkinson's disease causing autonomic dysregulation and orthostatic hypotension, leading to blood pressure fluctuations and dizziness upon standing. Lumbar radiculopathy (Chronic), PVD, HLD, spinal stenosis, BPH   Post-surgical recovery complicated by inappropriate medication management and inadequate rehabilitation, with delirium post-anesthesia likely exacerbated by Parkinson's disease.     PAIN:  Are you having pain?  No   PATIENT GOALS: To remember what was said                                                                                                                           TREATMENT DATE:  08/25/24: Cognition: Pt has two new catch-all baskets that he uses for collecting objects for his short-term memory recall. Pt has noticed a benefit from using these external memory systems. SLP reviewed using a memory journal as an external memory support for writing down important information for recall. Pt is agreeable to trying to use his memory journal more at home. SLP instructed pt to utilize 1-2 word responses to reduce amount needed to write for pt. Plan is to continue SPEAK OUT! And developing cognitive-communication support systems to maximize pt's independence with ADLs.  Dysphagia: Pt states that he has noticed issues with coughing and pills getting stuck. SLP perfomed the [facility] Protocol with pt to assess pt's swallow function. Pt passed the [facility] Protocol. Pt was also required to eat regular (IDDSI:7) snack for SLP to analyze pt's swallow safety. Pt displayed no overt s/sx of oropharyngeal dysphagia; however, evidence is limited without an objective examination of the swallow mechanism. SLP included pt's wife in discussion about Parkinson's impact on swallow physiology. SLP educated pt and pt's wife about watching for s/sx of dysphagia (coughing during meals, trouble swallowing pills).   08/04/24: SLP introduced more cognitive-communication strategies for supporting short-term memory at home. SLP discussed incorporating dump baskets as catch-all bins for pt's important items to improve short-term memory recall. Pt is agreeable to setting up containers/baskets as external memory strategy. SLP educated pt in using technology as external memory strategy (iPhone reminders). SLP guided pt through setting up reminders to use his memory journal at home more often. Pt successfully completed 12-step  process for setting up iPhone reminders given frequent max to mod A. SLP provided sequencing sheet for setting up iPhone reminders for future reference to support cognitive-communication skills. SLP provided pt and pt's wife with instructions for multiple memory compensations to optimize pt's cognitive QOL at home and with ADLs. Plan is to continue building cognitive-communication support systems.   07/18/24: Pt's wife was present during session. Pt's wife notes that she is very concerned about pt's cognitive skills, especially memory and problem solving. SLP performed  SLUMS with pt to assess cognitive-communication skills. Pt scored 12/30 on SLUMS. Pt presents with moderate cognitive-communication deficits primarily characterized by short-term and working memory impairment and executive function deficits including impaired problem solving and thought organization. Executive function deficits appeared to be linked with pt's short-term memory impairment, which resulted in pt requiring multiple repetitions of instructions. Pt's wife states that these behaviors are similar to what happens at home. Pt also endorses these statements. 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 SLP encourages pt to continue SPEAK OUT! Exercises for continued practice at home and for cognitive stimulation. Plan is to focus on establishing external memory systems for maximizing pt's independent problem solving for novel situations and conversations.   07/11/24: Pt notes that he is practicing 3-4x/week. He reports that his self-awareness of speaking with intent is improving with consistent practice. SLP provided pt a homework tracking log and challenged pt to practice 5-6x in the upcoming week for improving speech production.  Targeted volume and intelligibility using Speak Out! Lesson  12. Pt required occasional min A verbal cues, modeling, for volume and breath support.   Pt averages the following volume levels:  Sustained AH: 86 dB  Counting: 80 dB  Reading (phrases): 77 dB  Cognitive Exercise: 73 dB Required occasional min A verbal cues, modeling for carryover of intent /volume answering simple questions following structured practice. SLP reviewed using a memory journal for optimizing pt short-term memory recall. Pt states that he has not acquired a memory journal; however, he as several note-pads he can use at home. SLP encouraged pt to get a memory journal for writing down important information. Pt is agreeable for attempting to use memory journal. Plan is to discuss memory journal usage and integrate speaking with intent into conversational speech along with SPEAK OUT! Program.   07/04/24: Pt notes that he has not practiced much due to having eye issues.  Targeted volume and intelligibility using Speak Out! Lesson 11. Pt required frequent min A verbal cues, modeling, for volume and breath support.   Pt averages the following volume levels:  Sustained AH: 85 dB  Counting: 83 dB  Reading (phrases): 79 dB  Cognitive Exercise: 76 dB Required frequent min verbal cues, modeling for carryover of intent /volume answering simple questions following structured practice. During cognitive/conversational exercise, pt displayed short-term memory difficulties, forgetting specific instructions for rules. SLP inquired about short-term memory difficulties at home; pt states that he has difficulty with remembering what was said in previous conversations. SLP instructed pt in utilizing a memory journal during conversations to write down important information for later recall. Pt is agreeable about utilizing a memory journal. Pt states that he will place his memory journal on his kitchen table to remember to use it. Plan is to continue SPEAK OUT! Program, conversation-level tx, and further  development of cognitive support systems (external memory aids).    06/27/24: Pt reports that he is practicing sometimes since prior session. Pt states that he is realizing that he needs to practice more to improve speaking with intent.  Targeted volume and intelligibility using Speak Out! Lesson 10. Pt required frequent min verbal cues, modeling, for volume and breath support.   Pt averages the following volume levels:  Sustained AH: 86 dB  Counting: 77 dB  Reading (phrases): 76 dB  Cognitive Exercise: 73 dB Required mod to min verbal cues, modeling for carryover of intent /volume answering simple questions  following structured practice. Pt is making progress with dysarthria goals. Pt is working toward improving his compliance with HEP. SLP collaborated with pt on building a HEP program for carry-over of SPEAK OUT! Strategies. Pt plans on practicing at least 20 minutes/day until upcoming session. Pt is making progress with    06/21/24: Garrel reports completing HEP not as much as I should He has had inconsistent schedule of ST sessions - will add 4 more visits to max carryover of HEP and intelligible speech outside of ST.  Targeted volume and intelligibility using Speak Out! Lesson November week 1 from [employer]. Pt required occasional lmin verbal cues, modeling, for volume and breath support.  Pt averages the following volume levels:  Sustained AH: 88dB  Counting:83dB  Reading (phrases): 84dB  Cognitive Exercise: 74dB with frequent mod A Required usual min to mod verbal cues, modeling for carryover of intent /volume in spontaneous conversation. Targeted generating a plan for carryover of HEP consistently   06/07/24:  Targeted volume and intelligibility using Speak Out! Lesson 7. Pt required frequent min A verbal cues, modeling, for volume and breath support.   Pt averages the following volume levels:  Sustained AH: 88 dB  Counting: 80 dB  Reading (phrases): 80 dB  Cognitive  Exercise: 75 dB Required frequent min A verbal cues, modeling for carryover of intent /volume answering simple questions following structured practice.    05/24/24: Target improving vocal quality and increasing intensity through progressively difficulty speech tasks using Speak Out! program, Lesson October week 3 - he averaged 75dB on reading halloween sentences - modified conversation task to divergent naming costumes - he averaged 74dB with rare min A - he required usual min to mod semantic cues to name 18 costumes. Reading 3 pages of his personally relevant phrases with average of 74dB and rare min A - occasional extended time to read phrase correctly.   05/08/24: Target improving vocal quality and increasing intensity through progressively difficulty speech tasks using Speak Out! program, lesson [place] from [employer] online book. ST leads pt through exercises providing usual model prior to pt execution. occasional mod-A required to achieve target dB this date. Averages this date:  Resonant Vowels: 86 dB  Sustained ah 86 dB -- cues needed to avoid volume decay; unable to mitigate the volume decay with high to low glide  Counting: 81 dB  Reading 76 dB (visual challenges appeared to be a barrier)  Cognitive speech task 78 dB -- no cues needed Conversational sample of approx 10 minutes, pt averages 71 dB with occasional min-A.  05/02/24: Garrel enters with sub WNL volume. Targeted memory of SPEAK OUT! Exercises - with usual mod cues to recall exercises and to read instructions and required reps. He required initial modeling to use intent with Ah to average 88dB, 84dB on glides, 80dB on counting . Generated 6 more personally relevant sentences - Targeted reading and speaking with intent reading 4 words and explaining which does not belong and why - reading averaged 74dB, explanation averaged 70dB with usual min to mod verbal cues and modeling. Homework continues to be to complete an online  home practice session. In   04/26/24:  Garrel enters with SPEAK OUT! Booklet - He has completed lessons consistently since receiving the booklet. Today, targeted intelligibility and volume using e Library A Year of Intent - October 1st week. Garrel required consistent modeling and cueing to complete each exercise correctly and with the most intent. Sustained Ah - 85dB, counting  82dB. Reading - 72dB, conversation exercises - 68dB - Garrel required frequent mod semantic cues and phrase completion cues to name 5 items in given categories and frequent mod verbal cues and modeling to use intent when cognitive load increases.   03/1724: Garrel received his SPEAK OUT! Booklet. They have watched the What is Parkinson's video and will have their children watch it as well.  Targeted volume and intelligibility using Speak Out! Lesson 2. Pt required initial usual mod verbal cues, modeling, for volume and breath support, fading to occasional min A.  Pt averages the following volume levels:  Sustained AH: 84dB  Counting:80dB  Reading (phrases): 75dB  Cognitive Exercise: 72dB with frequent mod A Required  verbal cues, modeling for carryover of intent /volume answering simple questions following structured practice. Generated list of personally relevant phrases to practice with intent, including name, number DOB, address, restaurant orders and script explaining that he has PD and may have trouble speaking- See Patient instructions   04/04/24: Evaluation completed. Introduced [employer] - ordered ANHEUSER-[employer]CH OUT! Booklet and [employer]. Demonstrated navigating website to locate What is Parkinsons video and home practice sessions. Initiated SPEAK OUT! Lesson 1 to  target volume, intelligibility and speaking with intent as well as cognitive communication exercises. With usual min to mod A, [name] averaged 79dB on sustained Ah, 76dB on glides, 78dB counting and 75dB reading. In conversation/cognitive exercise, with  verbal cues and modeling, he averaged 71dB. Instructed them to complete Lesson one again in [employer], as well at to complete home practice sessions on website.    PATIENT EDUCATION: Education details: HEP for dysarthria, swallow precautions, compensations for slow processing and following conversation Person educated: Patient and Spouse Education method: Programmer, Multimedia, Facilities Manager, Verbal cues, and Handouts Education comprehension: verbal cues required and needs further education  HOME EXERCISE PROGRAM: SPEAK OUT!   GOALS: Goals reviewed with patient? Yes   LONG TERM GOALS: LTG=STGs Target date: 09/29/24  Pt will complete HEP with rare min A over 2 sessions Baseline:  Goal status: ONGOING/NOT MET  2.  Pt will complete 3 online home practice sessions Baseline:  Goal status: ONGOING/NOT MET  3.  Pt will access E Library and complete 3 lessons over 2 weeks with min A from spouse Baseline:  Goal status: ONGOING/NOT MET  4.  Pt will report no episodes of globus sensation with pills Baseline:  Goal status: ONGOING/NOT MET  5.  Pt will average 72dB over 15 minute conversation with rare min A Baseline:  Goal status: ONGOING/NOT MET  6.  Pt and spouse will carry over 4 compensatory strategies to support slow processing in conversation and directions Baseline:  Goal status:ONGOING/NOT MET  ASSESSMENT:  CLINICAL IMPRESSION: Patient is a 74 y.o. 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 He is completing HEP with rare to occasional min A to achieve target dB and maintain clear, intelligible speech. In conversation, he continues to require frequent min to mod A to carryover intelligible speech into conversation. Spouse reports she continues to have to cue Garrel to speak with intent. Garrel requires ongoing ST to maximize carryover of HEP and to carryover volume and clear phonation outside of ST. I recommend  skilled ST to maximize intelligibility, safety of swallow and cognition for safety, to reduce caregiver burden and improve participation in conversations for QOL. UPDATE (07/04/2024): Pt continues to make progress with speech goals. Pt's compliance with HEP has been inconsistent;  however, pt continues to improve compliance with HEP. Pt is progressing into conversation-level tx. UPDATE (08/25/24): Pt continues to make progress with cognitive-communication goal. Pt needs continued work with hypokinetic dysarthria goals to maximize his speech production during conversation. In addition, further instruction with cognitive-communication strategies will optimize pt's independence with ADLs.   OBJECTIVE IMPAIRMENTS: Objective impairments include attention, memory, awareness, executive functioning, dysarthria, and dysphagia. These impairments are limiting patient from managing medications, managing appointments, managing finances, household responsibilities, ADLs/IADLs, effectively communicating at home and in community, and safety when swallowing.Factors affecting potential to achieve goals and functional outcome are ability to learn/carryover information and medical prognosis.. Patient will benefit from skilled SLP services to address above impairments and improve overall function.  REHAB POTENTIAL: Good  PLAN:  SLP FREQUENCY: 1-2x/week  SLP DURATION: 12 weeks  PLANNED INTERVENTIONS: Aspiration precaution training, Pharyngeal strengthening exercises, Diet toleration management , Language facilitation, Environmental controls, Trials of upgraded texture/liquids, Cueing hierachy, Cognitive reorganization, Internal/external aids, Functional tasks, Multimodal communication approach, SLP instruction and feedback, Compensatory strategies, Patient/family education, [phone] Treatment of speech (30 or 45 min) , [id number] Treatment of swallowing function, [phone] Speech Eval Sound Prod, Artic, Phon, Eval Compre, Express, and  MBSS    Waddell Music, M.A., CF-SLP 08/25/2024, 4:21 PM      "

## 2024-08-30 ENCOUNTER — Other Ambulatory Visit: Payer: Self-pay

## 2024-08-31 ENCOUNTER — Other Ambulatory Visit

## 2024-08-31 ENCOUNTER — Other Ambulatory Visit (HOSPITAL_COMMUNITY): Payer: Self-pay

## 2024-09-01 ENCOUNTER — Other Ambulatory Visit: Payer: Self-pay

## 2024-09-01 ENCOUNTER — Other Ambulatory Visit (HOSPITAL_COMMUNITY): Payer: Self-pay

## 2024-09-01 ENCOUNTER — Ambulatory Visit: Attending: Adult Health

## 2024-09-01 DIAGNOSIS — R41841 Cognitive communication deficit: Secondary | ICD-10-CM

## 2024-09-01 DIAGNOSIS — R471 Dysarthria and anarthria: Secondary | ICD-10-CM

## 2024-09-01 NOTE — Therapy (Signed)
 " OUTPATIENT SPEECH LANGUAGE PATHOLOGY PARKINSON'S TREATMENT   Patient Name: [name] MRN: [id number] DOB:[birth year], 74 y.o., male Today's Date: 09/01/2024  PCP: [name], MD REFERRING PROVIDER: [name], NP  END OF SESSION:  End of Session - 09/01/24 1531     Visit Number 16    Number of Visits 18    Date for Recertification  09/29/24    SLP Start Time 1450    SLP Stop Time  1529    SLP Time Calculation (min) 39 min    Activity Tolerance Patient tolerated treatment well                 Past Medical History:  Diagnosis Date   Allergy    seasonal   Anxiety    Arthritis    BPH (benign prostatic hyperplasia)    Complication of anesthesia    Pt. stated he had a reaction that ended in him requiring urinary cath placement; hx delirium worsening parkinson symptoms   Depression    Diverticulosis    GERD (gastroesophageal reflux disease)    esophageal spasms   Glaucoma    Gout    Head injury, closed, with concussion    Hepatitis C    chronic - Has been treated with Harvoni   HLD (hyperlipidemia)    statin intolerant (Crestor  & Simvastatin ) - Taking Livalo  1mg  / week   Hypertension    Neuromuscular disorder (HCC)    Parkinson's Disease   Parkinson's disease (HCC)    Plantar fasciitis    right   PVD (peripheral vascular disease)    With no claudication; only mild abdominal aortic atherosclerosis noted on ultrasound.   Sleep apnea    Wears CPAP nightly   Spinal stenosis of lumbar region    Thoracic ascending aortic aneurysm    4.2 cm ascending TAA 09/2016 CT, 1 yr f/u rec   Ulcer    Past Surgical History:  Procedure Laterality Date   ANTERIOR LAT LUMBAR FUSION N/A 12/31/2023   Procedure: Prone trans-psoas interbody fusion - Lumbar three-Lumbar four - Lumbar four-Lumbar five, right facetectomy Lumbar four-five;  Surgeon: [name], MD;  Location: [facility];  Service: Neurosurgery;  Laterality: N/A;   BUNIONECTOMY WITH WEIL OSTEOTOMY Right  11/02/2019   Procedure: Right Foot Lapidus, Modified McBride Bunionectomy,  Hallux Akin Osteotomy;  Surgeon: [name], MD;  Location: [facility];  Service: Orthopedics;  Laterality: Right;   CARDIAC CATHETERIZATION  2005   30% Cx. Dr. Lavon   cataract surgery Left 11/05/2014   COLONOSCOPY     COLONOSCOPY N/A 01/09/2021   Procedure: COLONOSCOPY;  Surgeon: [name], MD;  Location: [facility];  Service: Gastroenterology;  Laterality: N/A;   ESOPHAGOGASTRODUODENOSCOPY N/A 05/02/2015   Procedure: ESOPHAGOGASTRODUODENOSCOPY (EGD);  Surgeon: [name], MD;  Location: [facility];  Service: Endoscopy;  Laterality: N/A;   ESOPHAGOGASTRODUODENOSCOPY  05/02/2015   no source of pt chest pain endoscopically evident. small hiatal hernia.   ESOPHAGOGASTRODUODENOSCOPY (EGD) WITH PROPOFOL  N/A 01/02/2021   Procedure: ESOPHAGOGASTRODUODENOSCOPY (EGD) WITH PROPOFOL ;  Surgeon: [name], MD;  Location: [facility];  Service: Endoscopy;  Laterality: N/A;   HARDWARE REMOVAL Right 11/02/2019   Procedure: Second Metatarsal Removal of Deep Implant and Rotational Osteotomy;  Surgeon: [name], MD;  Location: [facility];  Service: Orthopedics;  Laterality: Right;   IR ANGIOGRAM SELECTIVE EACH ADDITIONAL VESSEL  01/04/2021   IR ANGIOGRAM SELECTIVE EACH ADDITIONAL VESSEL  01/04/2021   IR ANGIOGRAM VISCERAL SELECTIVE  01/04/2021  IR US  GUIDE VASC ACCESS RIGHT  01/04/2021   LUMBAR LAMINECTOMY/DECOMPRESSION MICRODISCECTOMY N/A 07/03/2021   Procedure: Lumbar three through five decompression with lumbar three through five insitu fusion;  Surgeon: [name], MD;  Location: [facility] OR;  Service: Orthopedics;  Laterality: N/A;   LUMBAR PERCUTANEOUS PEDICLE SCREW 2 LEVEL N/A 12/31/2023   Procedure: LUMBAR PERCUTANEOUS PEDICLE SCREW LUMBAR THREE-LUMBAR FIVE;  Surgeon: [name], MD;  Location: [facility];  Service: Neurosurgery;  Laterality: N/A;   MEMBRANE PEEL Left  03/14/2014   Procedure: MEMBRANE PEEL; ENDOLASER;  Surgeon: [name], MD;  Location: [facility];  Service: Ophthalmology;  Laterality: Left;   NM MYOVIEW  LTD  10/2015   LOW RISK. Small, fixed basal lateral defect - likely diaphragmatic attenuation. EF 69%   PARS PLANA VITRECTOMY Left 03/14/2014   Procedure: PARS PLANA VITRECTOMY WITH 25 GAUGE;  Surgeon: [name], MD;  Location: [facility] OR;  Service: Ophthalmology;  Laterality: Left;   shave  02/03/2022   angiofibroma   TONSILLECTOMY     TRANSTHORACIC ECHOCARDIOGRAM  01/07/2021   Normal EF 60 to 65%.  No R WMA.  GRII DD-moderately dilated LA..  Mildly dilated RV but normal function.  Normal RAP/CVP.  Trivial AI with mild to moderate sclerosis-no stenosis   UMBILICAL HERNIA REPAIR  2023   UPPER GASTROINTESTINAL ENDOSCOPY     WEIL OSTEOTOMY Right 09/01/2017   Procedure: RIGHT GREAT TOE CHEVRON AND WEIL OSTEOTOMY 2ND METATARSAL;  Surgeon: [name], MD;  Location: [facility];  Service: Orthopedics;  Laterality: Right;   XI ROBOTIC ASSISTED VENTRAL HERNIA N/A 03/17/2022   Procedure: XI ROBOTIC ASSISTED VENTRAL HERNIA;  Surgeon: [name], MD;  Location: [facility];  Service: General;  Laterality: N/A;   Patient Active Problem List   Diagnosis Date Noted   Acute gastric ulcer with hemorrhage 08/01/2024   Chronic hepatitis C (HCC) 03/09/2024   Malnutrition of moderate degree 01/06/2024   Spondylolisthesis at L4-L5 level 12/31/2023   Supine hypertension 12/03/2023   Parkinson's disease with neurogenic orthostatic hypotension (HCC) 12/03/2023   Preop cardiovascular exam 12/03/2023   Glaucoma 11/30/2023   Retinal vein thrombosis (HCC) 11/30/2023   Acquired hallux rigidus of right foot 11/23/2022   Ventral hernia without obstruction or gangrene    Primary open angle glaucoma of left eye, mild stage 06/23/2021   Facial numbness 12/24/2020   Neovascular glaucoma, right eye, stage unspecified 12/16/2020   Lumbar radiculopathy 11/25/2020    Gastroesophageal reflux disease 09/10/2020   Dry eyes, bilateral 08/15/2020   Presbycusis of right ear 01/17/2020   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) 12/12/2019   Secondary corneal edema of right eye 12/12/2019   Ptosis of right eyelid 12/12/2019   OAB (overactive bladder) 11/29/2019   Bunion of great toe of right foot 07/14/2017   Claw toe, acquired, right 07/14/2017   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   Medication management 10/09/2016   Plantar fasciitis of right foot 02/19/2015   Depression 08/14/2014   Family history of heart disease in male family member before age 89 04/26/2014   Mild cognitive impairment 03/13/2014   Parkinsonian tremor (HCC) 02/12/2014   Hyperlipidemia with target LDL less than 100    Abdominal aortic atherosclerosis    Moderate essential hypertension 02/25/2011   Arthropathy 02/25/2011   HAV (hallux abducto valgus) 02/25/2011    ONSET DATE: 02/08/24 (referral date)  REFERRING DIAG: G20.A2 (ICD-10-CM) - Parkinson's disease without dyskinesia, with fluctuating manifestations (  HCC)  THERAPY DIAG:  Cognitive communication deficit  Dysarthria and anarthria  Rationale for Evaluation and Treatment: Rehabilitation  SUBJECTIVE:   SUBJECTIVE STATEMENT: I have been practicing sometimes. Reports a mix of online and workbook activities.  Pt accompanied by: significant other Bea  PERTINENT HISTORY: recent back surgery (L3-5) on 12/31/23, Parkinson's disease causing autonomic dysregulation and orthostatic hypotension, leading to blood pressure fluctuations and dizziness upon standing. Lumbar radiculopathy (Chronic), PVD, HLD, spinal stenosis, BPH   Post-surgical recovery complicated by inappropriate medication management and inadequate rehabilitation, with delirium post-anesthesia likely exacerbated by Parkinson's disease.     PAIN:  Are you having pain? No   PATIENT GOALS: To  remember what was said                                                                                                                           TREATMENT DATE:  09/01/24: Pt's wife was present for session. Pt's wife notes that they were having trouble accessing the e-library for SPEAK OUT!. SLP provided pt and pt's wife access to the elibrary for continued practice for optimizing speech production for QOL.  Targeted volume and intelligibility using Speak Out! Lesson 1, level II. Pt required frequent verbal cues, modeling, for volume and breath support.   Pt averages the following volume levels:  Sustained AH: 81 dB  Counting:76 dB  Reading (phrases): 77 dB  Cognitive Exercise: 74 dB Required occasional verbal cues, modeling for carryover of intent /volume answering simple questions following structured practice. Plan is to continue SPEAK OUT! And cognitive-communication strategies for optimizing pt's speech and cognition during ADLs.    08/25/24: Cognition: Pt has two new catch-all baskets that he uses for collecting objects for his short-term memory recall. Pt has noticed a benefit from using these external memory systems. SLP reviewed using a memory journal as an external memory support for writing down important information for recall. Pt is agreeable to trying to use his memory journal more at home. SLP instructed pt to utilize 1-2 word responses to reduce amount needed to write for pt. Plan is to continue SPEAK OUT! And developing cognitive-communication support systems to maximize pt's independence with ADLs.  Dysphagia: Pt states that he has noticed issues with coughing and pills getting stuck. SLP perfomed the [facility] Protocol with pt to assess pt's swallow function. Pt passed the [facility] Protocol. Pt was also required to eat regular (IDDSI:7) snack for SLP to analyze pt's swallow safety. Pt displayed no overt s/sx of oropharyngeal dysphagia; however, evidence is limited without  an objective examination of the swallow mechanism. SLP included pt's wife in discussion about Parkinson's impact on swallow physiology. SLP educated pt and pt's wife about watching for s/sx of dysphagia (coughing during meals, trouble swallowing pills).   08/04/24: SLP introduced more cognitive-communication strategies for supporting short-term memory at home. SLP discussed incorporating dump baskets as catch-all bins for pt's important items to improve short-term memory recall. Pt is agreeable to  setting up containers/baskets as external memory strategy. SLP educated pt in using technology as external memory strategy (iPhone reminders). SLP guided pt through setting up reminders to use his memory journal at home more often. Pt successfully completed 12-step process for setting up iPhone reminders given frequent max to mod A. SLP provided sequencing sheet for setting up iPhone reminders for future reference to support cognitive-communication skills. SLP provided pt and pt's wife with instructions for multiple memory compensations to optimize pt's cognitive QOL at home and with ADLs. Plan is to continue building cognitive-communication support systems.   07/18/24: Pt's wife was present during session. Pt's wife notes that she is very concerned about pt's cognitive skills, especially memory and problem solving. SLP performed SLUMS with pt to assess cognitive-communication skills. Pt scored 12/30 on SLUMS. Pt presents with moderate cognitive-communication deficits primarily characterized by short-term and working memory impairment and executive function deficits including impaired problem solving and thought organization. Executive function deficits appeared to be linked with pt's short-term memory impairment, which resulted in pt requiring multiple repetitions of instructions. Pt's wife states that these behaviors are similar to what happens at home. Pt also endorses these statements. Due to results from  cognitive-communication assessment, recommend further ST sessions for addressing further cognitive-communication goals, rather than previous focus on hypokinetic dysarthria. SLP encourages pt to continue SPEAK OUT! Exercises for continued practice at home and for cognitive stimulation. Plan is to focus on establishing external memory systems for maximizing pt's independent problem solving for novel situations and conversations.   07/11/24: Pt notes that he is practicing 3-4x/week. He reports that his self-awareness of speaking with intent is improving with consistent practice. SLP provided pt a homework tracking log and challenged pt to practice 5-6x in the upcoming week for improving speech production.  Targeted volume and intelligibility using Speak Out! Lesson 12. Pt required occasional min A verbal cues, modeling, for volume and breath support.   Pt averages the following volume levels:  Sustained AH: 86 dB  Counting: 80 dB  Reading (phrases): 77 dB  Cognitive Exercise: 73 dB Required occasional min A verbal cues, modeling for carryover of intent /volume answering simple questions following structured practice. SLP reviewed using a memory journal for optimizing pt short-term memory recall. Pt states that he has not acquired a memory journal; however, he as several note-pads he can use at home. SLP encouraged pt to get a memory journal for writing down important information. Pt is agreeable for attempting to use memory journal. Plan is to discuss memory journal usage and integrate speaking with intent into conversational speech along with SPEAK OUT! Program.   07/04/24: Pt notes that he has not practiced much due to having eye issues.  Targeted volume and intelligibility using Speak Out! Lesson 11. Pt required frequent min A verbal cues, modeling, for volume and breath support.   Pt averages the following volume levels:  Sustained AH: 85 dB  Counting: 83 dB  Reading (phrases): 79  dB  Cognitive Exercise: 76 dB Required frequent min verbal cues, modeling for carryover of intent /volume answering simple questions following structured practice. During cognitive/conversational exercise, pt displayed short-term memory difficulties, forgetting specific instructions for rules. SLP inquired about short-term memory difficulties at home; pt states that he has difficulty with remembering what was said in previous conversations. SLP instructed pt in utilizing a memory journal during conversations to write down important information for later recall. Pt is agreeable about utilizing a memory journal. Pt states that he will place his  memory journal on his kitchen table to remember to use it. Plan is to continue SPEAK OUT! Program, conversation-level tx, and further development of cognitive support systems (external memory aids).    06/27/24: Pt reports that he is practicing sometimes since prior session. Pt states that he is realizing that he needs to practice more to improve speaking with intent.  Targeted volume and intelligibility using Speak Out! Lesson 10. Pt required frequent min verbal cues, modeling, for volume and breath support.   Pt averages the following volume levels:  Sustained AH: 86 dB  Counting: 77 dB  Reading (phrases): 76 dB  Cognitive Exercise: 73 dB Required mod to min verbal cues, modeling for carryover of intent /volume answering simple questions following structured practice. Pt is making progress with dysarthria goals. Pt is working toward improving his compliance with HEP. SLP collaborated with pt on building a HEP program for carry-over of SPEAK OUT! Strategies. Pt plans on practicing at least 20 minutes/day until upcoming session. Pt is making progress with    06/21/24: Garrel reports completing HEP not as much as I should He has had inconsistent schedule of ST sessions - will add 4 more visits to max carryover of HEP and intelligible speech outside of ST.   Targeted volume and intelligibility using Speak Out! Lesson November week 1 from [employer]. Pt required occasional lmin verbal cues, modeling, for volume and breath support.  Pt averages the following volume levels:  Sustained AH: 88dB  Counting:83dB  Reading (phrases): 84dB  Cognitive Exercise: 74dB with frequent mod A Required usual min to mod verbal cues, modeling for carryover of intent /volume in spontaneous conversation. Targeted generating a plan for carryover of HEP consistently   06/07/24:  Targeted volume and intelligibility using Speak Out! Lesson 7. Pt required frequent min A verbal cues, modeling, for volume and breath support.   Pt averages the following volume levels:  Sustained AH: 88 dB  Counting: 80 dB  Reading (phrases): 80 dB  Cognitive Exercise: 75 dB Required frequent min A verbal cues, modeling for carryover of intent /volume answering simple questions following structured practice.    05/24/24: Target improving vocal quality and increasing intensity through progressively difficulty speech tasks using Speak Out! program, Lesson October week 3 - he averaged 75dB on reading halloween sentences - modified conversation task to divergent naming costumes - he averaged 74dB with rare min A - he required usual min to mod semantic cues to name 18 costumes. Reading 3 pages of his personally relevant phrases with average of 74dB and rare min A - occasional extended time to read phrase correctly.   05/08/24: Target improving vocal quality and increasing intensity through progressively difficulty speech tasks using Speak Out! program, lesson [place] from [employer] online book. ST leads pt through exercises providing usual model prior to pt execution. occasional mod-A required to achieve target dB this date. Averages this date:  Resonant Vowels: 86 dB  Sustained ah 86 dB -- cues needed to avoid volume decay; unable to mitigate the volume decay with high to low glide   Counting: 81 dB  Reading 76 dB (visual challenges appeared to be a barrier)  Cognitive speech task 78 dB -- no cues needed Conversational sample of approx 10 minutes, pt averages 71 dB with occasional min-A.  05/02/24: Garrel enters with sub WNL volume. Targeted memory of SPEAK OUT! Exercises - with usual mod cues to recall exercises and to read instructions and required reps. He required initial  modeling to use intent with Ah to average 88dB, 84dB on glides, 80dB on counting . Generated 6 more personally relevant sentences - Targeted reading and speaking with intent reading 4 words and explaining which does not belong and why - reading averaged 74dB, explanation averaged 70dB with usual min to mod verbal cues and modeling. Homework continues to be to complete an online home practice session. In   04/26/24:  Garrel enters with SPEAK OUT! Booklet - He has completed lessons consistently since receiving the booklet. Today, targeted intelligibility and volume using e Library A Year of Intent - October 1st week. Garrel required consistent modeling and cueing to complete each exercise correctly and with the most intent. Sustained Ah - 85dB, counting 82dB. Reading - 72dB, conversation exercises - 68dB - Garrel required frequent mod semantic cues and phrase completion cues to name 5 items in given categories and frequent mod verbal cues and modeling to use intent when cognitive load increases.   03/1724: Garrel received his SPEAK OUT! Booklet. They have watched the What is Parkinson's video and will have their children watch it as well.  Targeted volume and intelligibility using Speak Out! Lesson 2. Pt required initial usual mod verbal cues, modeling, for volume and breath support, fading to occasional min A.  Pt averages the following volume levels:  Sustained AH: 84dB  Counting:80dB  Reading (phrases): 75dB  Cognitive Exercise: 72dB with frequent mod A Required  verbal cues, modeling for carryover of intent /volume  answering simple questions following structured practice. Generated list of personally relevant phrases to practice with intent, including name, number DOB, address, restaurant orders and script explaining that he has PD and may have trouble speaking- See Patient instructions   04/04/24: Evaluation completed. Introduced [employer] - ordered ANHEUSER-BUSCH OUT! Booklet and [employer]. Demonstrated navigating website to locate What is Parkinsons video and home practice sessions. Initiated SPEAK OUT! Lesson 1 to  target volume, intelligibility and speaking with intent as well as cognitive communication exercises. With usual min to mod A, [name] averaged 79dB on sustained Ah, 76dB on glides, 78dB counting and 75dB reading. In conversation/cognitive exercise, with verbal cues and modeling, he averaged 71dB. Instructed them to complete Lesson one again in [employer], as well at to complete home practice sessions on website.    PATIENT EDUCATION: Education details: HEP for dysarthria, swallow precautions, compensations for slow processing and following conversation Person educated: Patient and Spouse Education method: Programmer, Multimedia, Facilities Manager, Verbal cues, and Handouts Education comprehension: verbal cues required and needs further education  HOME EXERCISE PROGRAM: SPEAK OUT!   GOALS: Goals reviewed with patient? Yes   LONG TERM GOALS: LTG=STGs Target date: 09/29/24  Pt will complete HEP with rare min A over 2 sessions Baseline:  Goal status: ONGOING/NOT MET  2.  Pt will complete 3 online home practice sessions Baseline:  Goal status: ONGOING/NOT MET  3.  Pt will access E Library and complete 3 lessons over 2 weeks with min A from spouse Baseline:  Goal status: ONGOING/NOT MET  4.  Pt will report no episodes of globus sensation with pills Baseline:  Goal status: ONGOING/NOT MET  5.  Pt will average 72dB over 15 minute conversation with rare min A Baseline:  Goal status:  ONGOING/NOT MET  6.  Pt and spouse will carry over 4 compensatory strategies to support slow processing in conversation and directions Baseline:  Goal status:ONGOING/NOT MET  ASSESSMENT:  CLINICAL IMPRESSION: Patient is a 74 y.o. 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 He is completing HEP with rare to occasional min A to achieve target dB and maintain clear, intelligible speech. In conversation, he continues to require frequent min to mod A to carryover intelligible speech into conversation. Spouse reports she continues to have to cue Garrel to speak with intent. Garrel requires ongoing ST to maximize carryover of HEP and to carryover volume and clear phonation outside of ST. I recommend skilled ST to maximize intelligibility, safety of swallow and cognition for safety, to reduce caregiver burden and improve participation in conversations for QOL. UPDATE (07/04/2024): Pt continues to make progress with speech goals. Pt's compliance with HEP has been inconsistent; however, pt continues to improve compliance with HEP. Pt is progressing into conversation-level tx. UPDATE (08/25/24): Pt continues to make progress with cognitive-communication goal. Pt needs continued work with hypokinetic dysarthria goals to maximize his speech production during conversation. In addition, further instruction with cognitive-communication strategies will optimize pt's independence with ADLs.   OBJECTIVE IMPAIRMENTS: Objective impairments include attention, memory, awareness, executive functioning, dysarthria, and dysphagia. These impairments are limiting patient from managing medications, managing appointments, managing finances, household responsibilities, ADLs/IADLs, effectively communicating at home and in community, and safety when swallowing.Factors affecting potential to achieve goals and functional outcome are ability to learn/carryover  information and medical prognosis.. Patient will benefit from skilled SLP services to address above impairments and improve overall function.  REHAB POTENTIAL: Good  PLAN:  SLP FREQUENCY: 1-2x/week  SLP DURATION: 12 weeks  PLANNED INTERVENTIONS: Aspiration precaution training, Pharyngeal strengthening exercises, Diet toleration management , Language facilitation, Environmental controls, Trials of upgraded texture/liquids, Cueing hierachy, Cognitive reorganization, Internal/external aids, Functional tasks, Multimodal communication approach, SLP instruction and feedback, Compensatory strategies, Patient/family education, [phone] Treatment of speech (30 or 45 min) , [id number] Treatment of swallowing function, [phone]- Speech Eval Sound Prod, Artic, Phon, Eval Compre, [employer], and [employer]    [employer], M.A., CF-SLP 09/01/2024, 3:32 PM      "

## 2024-09-15 ENCOUNTER — Ambulatory Visit

## 2024-09-22 ENCOUNTER — Ambulatory Visit

## 2024-09-29 ENCOUNTER — Ambulatory Visit

## 2024-09-29 ENCOUNTER — Other Ambulatory Visit

## 2024-10-24 ENCOUNTER — Ambulatory Visit: Payer: Self-pay

## 2024-11-07 ENCOUNTER — Ambulatory Visit: Admitting: Adult Health

## 2024-11-09 ENCOUNTER — Ambulatory Visit: Admitting: Adult Health

## 2024-12-05 ENCOUNTER — Ambulatory Visit: Admitting: Physical Therapy

## 2024-12-05 ENCOUNTER — Ambulatory Visit: Attending: Adult Health | Admitting: Occupational Therapy

## 2024-12-12 ENCOUNTER — Ambulatory Visit: Admitting: Cardiology
